# Patient Record
Sex: Male | Born: 1950 | Race: White | Hispanic: No | Smoking: Former smoker
Health system: Southern US, Community
[De-identification: ages and names within clinical notes are randomized; demographics above are authoritative.]

## PROBLEM LIST (undated history)

## (undated) DIAGNOSIS — R519 Headache, unspecified: Secondary | ICD-10-CM

## (undated) DIAGNOSIS — I10 Essential (primary) hypertension: Secondary | ICD-10-CM

## (undated) DIAGNOSIS — K259 Gastric ulcer, unspecified as acute or chronic, without hemorrhage or perforation: Secondary | ICD-10-CM

## (undated) DIAGNOSIS — G4730 Sleep apnea, unspecified: Secondary | ICD-10-CM

## (undated) DIAGNOSIS — K3189 Other diseases of stomach and duodenum: Secondary | ICD-10-CM

## (undated) DIAGNOSIS — E119 Type 2 diabetes mellitus without complications: Secondary | ICD-10-CM

## (undated) DIAGNOSIS — I864 Gastric varices: Secondary | ICD-10-CM

## (undated) DIAGNOSIS — Z87442 Personal history of urinary calculi: Secondary | ICD-10-CM

## (undated) DIAGNOSIS — Z860101 Personal history of adenomatous and serrated colon polyps: Secondary | ICD-10-CM

## (undated) DIAGNOSIS — K635 Polyp of colon: Secondary | ICD-10-CM

## (undated) DIAGNOSIS — I700 Atherosclerosis of aorta: Secondary | ICD-10-CM

## (undated) DIAGNOSIS — R112 Nausea with vomiting, unspecified: Secondary | ICD-10-CM

## (undated) DIAGNOSIS — R011 Cardiac murmur, unspecified: Secondary | ICD-10-CM

## (undated) DIAGNOSIS — I350 Nonrheumatic aortic (valve) stenosis: Secondary | ICD-10-CM

## (undated) DIAGNOSIS — D126 Benign neoplasm of colon, unspecified: Secondary | ICD-10-CM

## (undated) DIAGNOSIS — R51 Headache: Secondary | ICD-10-CM

## (undated) DIAGNOSIS — K7460 Unspecified cirrhosis of liver: Secondary | ICD-10-CM

## (undated) DIAGNOSIS — Z86010 Personal history of colonic polyps: Secondary | ICD-10-CM

## (undated) DIAGNOSIS — N400 Enlarged prostate without lower urinary tract symptoms: Secondary | ICD-10-CM

## (undated) DIAGNOSIS — K2970 Gastritis, unspecified, without bleeding: Secondary | ICD-10-CM

## (undated) DIAGNOSIS — J45909 Unspecified asthma, uncomplicated: Secondary | ICD-10-CM

## (undated) DIAGNOSIS — N281 Cyst of kidney, acquired: Secondary | ICD-10-CM

## (undated) DIAGNOSIS — N509 Disorder of male genital organs, unspecified: Secondary | ICD-10-CM

## (undated) DIAGNOSIS — K5790 Diverticulosis of intestine, part unspecified, without perforation or abscess without bleeding: Secondary | ICD-10-CM

## (undated) DIAGNOSIS — M1990 Unspecified osteoarthritis, unspecified site: Secondary | ICD-10-CM

## (undated) DIAGNOSIS — R569 Unspecified convulsions: Secondary | ICD-10-CM

## (undated) DIAGNOSIS — Z9889 Other specified postprocedural states: Secondary | ICD-10-CM

## (undated) DIAGNOSIS — K219 Gastro-esophageal reflux disease without esophagitis: Secondary | ICD-10-CM

## (undated) HISTORY — DX: Personal history of colonic polyps: Z86.010

## (undated) HISTORY — PX: TONSILLECTOMY: SUR1361

## (undated) HISTORY — DX: Gastritis, unspecified, without bleeding: K29.70

## (undated) HISTORY — DX: Diverticulosis of intestine, part unspecified, without perforation or abscess without bleeding: K57.90

## (undated) HISTORY — DX: Cyst of kidney, acquired: N28.1

## (undated) HISTORY — DX: Benign prostatic hyperplasia without lower urinary tract symptoms: N40.0

## (undated) HISTORY — DX: Gastric ulcer, unspecified as acute or chronic, without hemorrhage or perforation: K25.9

## (undated) HISTORY — DX: Essential (primary) hypertension: I10

## (undated) HISTORY — PX: BACK SURGERY: SHX140

## (undated) HISTORY — PX: HERNIA REPAIR: SHX51

## (undated) HISTORY — DX: Disorder of male genital organs, unspecified: N50.9

## (undated) HISTORY — DX: Other diseases of stomach and duodenum: K31.89

## (undated) HISTORY — DX: Personal history of adenomatous and serrated colon polyps: Z86.0101

## (undated) HISTORY — DX: Gastric varices: I86.4

## (undated) HISTORY — DX: Atherosclerosis of aorta: I70.0

## (undated) HISTORY — DX: Gastro-esophageal reflux disease without esophagitis: K21.9

## (undated) HISTORY — DX: Polyp of colon: K63.5

## (undated) HISTORY — DX: Unspecified cirrhosis of liver: K74.60

## (undated) HISTORY — DX: Benign neoplasm of colon, unspecified: D12.6

---

## 1995-03-30 HISTORY — PX: LUMBAR DISC SURGERY: SHX700

## 1998-02-18 ENCOUNTER — Inpatient Hospital Stay (HOSPITAL_COMMUNITY): Admission: EM | Admit: 1998-02-18 | Discharge: 1998-02-19 | Payer: Self-pay | Admitting: Emergency Medicine

## 1998-03-13 ENCOUNTER — Ambulatory Visit (HOSPITAL_BASED_OUTPATIENT_CLINIC_OR_DEPARTMENT_OTHER): Admission: RE | Admit: 1998-03-13 | Discharge: 1998-03-13 | Payer: Self-pay | Admitting: Specialist

## 1998-04-27 ENCOUNTER — Encounter: Admission: RE | Admit: 1998-04-27 | Discharge: 1998-07-26 | Payer: Self-pay | Admitting: *Deleted

## 1998-08-16 ENCOUNTER — Emergency Department (HOSPITAL_COMMUNITY): Admission: EM | Admit: 1998-08-16 | Discharge: 1998-08-16 | Payer: Self-pay | Admitting: Emergency Medicine

## 1999-01-22 ENCOUNTER — Ambulatory Visit (HOSPITAL_COMMUNITY): Admission: RE | Admit: 1999-01-22 | Discharge: 1999-01-22 | Payer: Self-pay | Admitting: Family Medicine

## 1999-01-22 ENCOUNTER — Encounter: Payer: Self-pay | Admitting: Family Medicine

## 2000-07-31 ENCOUNTER — Encounter: Payer: Self-pay | Admitting: Family Medicine

## 2000-07-31 ENCOUNTER — Ambulatory Visit (HOSPITAL_COMMUNITY): Admission: RE | Admit: 2000-07-31 | Discharge: 2000-07-31 | Payer: Self-pay | Admitting: Family Medicine

## 2000-10-30 ENCOUNTER — Encounter: Payer: Self-pay | Admitting: Family Medicine

## 2000-10-30 ENCOUNTER — Ambulatory Visit (HOSPITAL_COMMUNITY): Admission: RE | Admit: 2000-10-30 | Discharge: 2000-10-30 | Payer: Self-pay | Admitting: Family Medicine

## 2000-12-07 ENCOUNTER — Encounter: Payer: Self-pay | Admitting: Emergency Medicine

## 2000-12-08 ENCOUNTER — Encounter: Payer: Self-pay | Admitting: Emergency Medicine

## 2000-12-08 ENCOUNTER — Inpatient Hospital Stay (HOSPITAL_COMMUNITY): Admission: EM | Admit: 2000-12-08 | Discharge: 2000-12-11 | Payer: Self-pay | Admitting: Emergency Medicine

## 2000-12-19 ENCOUNTER — Encounter: Admission: RE | Admit: 2000-12-19 | Discharge: 2000-12-19 | Payer: Self-pay | Admitting: Family Medicine

## 2001-08-29 HISTORY — PX: INGUINAL HERNIA REPAIR: SHX194

## 2001-11-03 ENCOUNTER — Emergency Department (HOSPITAL_COMMUNITY): Admission: EM | Admit: 2001-11-03 | Discharge: 2001-11-03 | Payer: Self-pay | Admitting: *Deleted

## 2002-06-03 ENCOUNTER — Encounter: Payer: Self-pay | Admitting: Otolaryngology

## 2002-06-03 ENCOUNTER — Encounter: Admission: RE | Admit: 2002-06-03 | Discharge: 2002-06-03 | Payer: Self-pay | Admitting: Otolaryngology

## 2002-09-16 ENCOUNTER — Encounter: Admission: RE | Admit: 2002-09-16 | Discharge: 2002-09-16 | Payer: Self-pay | Admitting: Otolaryngology

## 2002-09-16 ENCOUNTER — Encounter: Payer: Self-pay | Admitting: Otolaryngology

## 2002-11-30 ENCOUNTER — Emergency Department (HOSPITAL_COMMUNITY): Admission: EM | Admit: 2002-11-30 | Discharge: 2002-11-30 | Payer: Self-pay | Admitting: Emergency Medicine

## 2002-11-30 ENCOUNTER — Encounter: Payer: Self-pay | Admitting: Emergency Medicine

## 2003-06-05 ENCOUNTER — Emergency Department (HOSPITAL_COMMUNITY): Admission: EM | Admit: 2003-06-05 | Discharge: 2003-06-05 | Payer: Self-pay | Admitting: Emergency Medicine

## 2003-07-07 ENCOUNTER — Ambulatory Visit (HOSPITAL_COMMUNITY): Admission: RE | Admit: 2003-07-07 | Discharge: 2003-07-07 | Payer: Self-pay | Admitting: Gastroenterology

## 2005-08-19 ENCOUNTER — Ambulatory Visit (HOSPITAL_COMMUNITY): Admission: RE | Admit: 2005-08-19 | Discharge: 2005-08-19 | Payer: Self-pay | Admitting: Gastroenterology

## 2005-09-16 ENCOUNTER — Ambulatory Visit (HOSPITAL_COMMUNITY): Admission: RE | Admit: 2005-09-16 | Discharge: 2005-09-16 | Payer: Self-pay | Admitting: Gastroenterology

## 2005-11-28 HISTORY — PX: SHOULDER SURGERY: SHX246

## 2006-07-14 HISTORY — PX: SHOULDER SURGERY: SHX246

## 2009-07-17 ENCOUNTER — Encounter: Payer: Self-pay | Admitting: Internal Medicine

## 2010-06-22 ENCOUNTER — Ambulatory Visit: Payer: Self-pay | Admitting: Internal Medicine

## 2010-06-22 DIAGNOSIS — J45909 Unspecified asthma, uncomplicated: Secondary | ICD-10-CM | POA: Insufficient documentation

## 2010-06-22 DIAGNOSIS — R05 Cough: Secondary | ICD-10-CM

## 2010-06-22 DIAGNOSIS — J309 Allergic rhinitis, unspecified: Secondary | ICD-10-CM | POA: Insufficient documentation

## 2010-06-22 DIAGNOSIS — E785 Hyperlipidemia, unspecified: Secondary | ICD-10-CM | POA: Insufficient documentation

## 2010-06-22 DIAGNOSIS — R059 Cough, unspecified: Secondary | ICD-10-CM | POA: Insufficient documentation

## 2010-09-28 NOTE — Procedures (Signed)
°

## 2010-09-28 NOTE — Assessment & Plan Note (Signed)
Summary: Pulmonary consultation/ cough ? uacs vs asthma   Copy to:  Dr. Morrie Sheldon Primary Provider/Referring Provider:  Dr. Morrie Sheldon  CC:  Consult Cough..  History of Present Illness: 60 yowm quit smoking 2005 with cough that seemed better p quit and relatively well preserved ex tolerance including long walks [place] sob but never oob  June 22, 2010  1st pulmonary office eval for cough, rarely productive,  present daily > nightly no noct exac or am flares. symbicort started for flare 1 year maybe helped a little.  No itching sneezing running nose. H/o bad gerd but says the acid heartburn has been eliminated with nexium.   Pt denies any significant sore throat, dysphagia, itching, sneezing,  nasal congestion or excess secretions,  fever, chills, sweats, unintended wt loss, pleuritic or exertional cp, hempoptysis, change in activity tolerance  orthopnea pnd or leg swelling Pt also denies any obvious fluctuation in symptoms with weather or environmental change or other alleviating or aggravating factors.       Current Medications (verified): 1)  Nexium 40 Mg Cpdr (Esomeprazole Magnesium) .... Take 1 Tablet By Mouth Once A Day 2)  Lipitor 40 Mg Tabs (Atorvastatin Calcium) .... Take 1 Tablet By Mouth Once A Day 3)  Singulair 10 Mg Tabs (Montelukast Sodium) .... Take 1 Tablet By Mouth Once A Day 4)  Fexofenadine Hcl 180 Mg Tabs (Fexofenadine Hcl) .... Take 1 Tablet By Mouth Once A Day 5)  Micardis 40 Mg Tabs (Telmisartan) .... Take 1 Tablet By Mouth Once A Day 6)  Humalog 100 Unit/ml Soln (Insulin Lispro (Human)) .... Sliding Scale 7)  Lantus 100 Unit/ml Soln (Insulin Glargine) .... 50 Units Twice Daily 8)  Androgel Pump 1.25 Gm/act (1%) Gel (Testosterone) .... 6 Pumps Daily 9)  Symbicort 80-4.5 Mcg/act Aero (Budesonide-Formoterol Fumarate) .... Unsure of Strength, 2 Puufs Twice Daily 10)  Niaspan 500 Mg Cr-Tabs (Niacin (Antihyperlipidemic)) .... Take 1 Tablet By Mouth Once A Day 11)   Aspir-Low 81 Mg Tbec (Aspirin) .... Take 1 Tablet By Mouth Once A Day 12)  Ranitidine Hcl 300 Mg Tabs (Ranitidine Hcl) .... Take 1 Tab By Mouth At Bedtime 13)  Vitamin D3 2000 Unit Caps (Cholecalciferol) .... Take 1 Tablet By Mouth Once A Day 14)  Multivitamins  Tabs (Multiple Vitamin) .... Take 1 Tablet By Mouth Once A Day 15)  B Complex  Tabs (B Complex Vitamins) .... Take 1 Tablet By Mouth Once A Day 16)  Caltrate 600+d Plus 600-400 Mg-Unit Tabs (Calcium Carbonate-Vit D-Min) .... Take 1 Tablet By Mouth Once A Day 17)  Fish Oil 1000 Mg Caps (Omega-3 Fatty Acids) .... Take 1 Tablet By Mouth Once A Day 18)  Glucosamine-Chondroitin 1500-1200 Mg/44m Liqd (Glucosamine-Chondroitin) .... Take 1 Tablet By Mouth Once A Day 19)  Ocuvite  Tabs (Multiple Vitamins-Minerals) .... Take 1 Tablet By Mouth Once A Day  Allergies: 1)  ! Erythromycin 2)  ! Sulfa  Past History:  Past Medical History: Allergic Rhinitis Asthma      [facility]  Chronic cough..........................................................Marland Kitchenert Diabetes Hyperlipidemia  Past Surgical History: back surgery-1994 Nose reconstruction Shoulder replacement-2008 Hernia-2000  Family History: Father-asthma MGM-heart disease, breast Mother-heart disease  Social History: Married retired Quit using pipe in 2005, used x 281yr Pt states he used cig. x 5 years prior to pipe use.   Review of Systems       The patient complains of non-productive cough.  The patient denies shortness of breath with activity, shortness of  breath at rest, productive cough, coughing up blood, chest pain, irregular heartbeats, acid heartburn, indigestion, loss of appetite, weight change, abdominal pain, difficulty swallowing, sore throat, tooth/dental problems, headaches, nasal congestion/difficulty breathing through nose, sneezing, itching, ear ache, anxiety, depression, hand/feet swelling, joint stiffness or pain, rash, change in color of  mucus, and fever.    Vital Signs:  Patient profile:   60 year old male Height:      70 inches Weight:      261 pounds BMI:     37.58 O2 Sat:      96 % on Room air Temp:     97.5 degrees F oral Pulse rate:   70 / minute BP sitting:   130 / 72  (right arm) Cuff size:   regular  Vitals Entered By: Sanders Bing CMA (June 22, 2010 8:57 AM)  O2 Flow:  Room air  Physical Exam  Additional Exam:  pleasant amb wm in nad  wt 245 June 22, 2010 HEENT: nl dentition, turbinates, and orophanx. Nl external ear canals without cough reflex NECK :  without JVD/Nodes/TM/ nl carotid upstrokes bilaterally LUNGS: no acc muscle use, clear to A and P bilaterally without cough on insp or exp maneuvers CV:  RRR  no s3 or murmur or increase in P2, no edema  ABD:  soft and nontender with nl excursion in the supine position. No bruits or organomegaly, bowel sounds nl MS:  warm without deformities, calf tenderness, cyanosis or clubbing SKIN: warm and dry without lesions   NEURO:  alert, approp, no deficits     Impression & Recommendations:  Problem # 1:  COUGH (ICD-786.2)  The most common causes of chronic cough in immunocompetent adults include: upper airway cough syndrome (UACS), previously referred to as postnasal drip syndrome,  caused by variety of rhinosinus conditions; (2) asthma; (3) GERD; (4) chronic bronchitis from cigarette smoking or other inhaled environmental irritants; (5) nonasthmatic eosinophilic bronchitis; and (6) bronchiectasis. These conditions, singly or in combination, have accounted for up to 94% of the causes of chronic cough in prospective studies.    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, so named because it's frequently impossible to sort out how much is  CR/sinusitis with freq throat clearing (which can be related to primary GERD)   vs  causing  secondary extra esophageal GERD from wide swings in gastric pressure that occur with throat  clearing, promoting self use of mint and menthol lozenges that reduce the lower esophageal sphincter tone and exacerbate the problem further These are the same pts who not infrequently have failed to tolerate ace inhibitors,  dry powder inhalers or biphosphonates or report having reflux symptoms that don't respond to standard doses of PPI  For now try optimize hfa technique with symbiocort since it seems to have helped (suggesting asthma) some but may aggravate uacs if hfa technique poor and max rx for gerd with diet and continued acid suppression  The standardized cough guidelines recently published in Chest are a 14 step process, not a single office visit,  and are intended  to address this problem logically,  with an alogrithm dependent on response to each progressive step  to determine a specific diagnosis with  minimal addtional testing needed. Therefore if compliance is an issue this empiric standardized approach simply won't work.  See instructions for specific recommendations   Orders: Consultation Level V (78676)  Problem # 2:  ALLERGIC RHINITIS (ICD-477.9)  His updated medication list for  this problem includes:    Fexofenadine Hcl 180 Mg Tabs (Fexofenadine hcl) ..... Use one daily if needed for itching sneezy runny nose  Appears well controlled so try off allegra and consider a trial of 1st gen antihistamines if persistent persistent cough.  Orders: Consultation Level V [id number]  Problem # 3:  ASTHMA (ICD-493.90) appears to be very well controlled with no evidence of sign copd   Medications Added to Medication List This Visit: 1)  Nexium 40 Mg Cpdr (Esomeprazole magnesium) .... Take 1 tablet by mouth once a day 2)  Nexium 40 Mg Cpdr (Esomeprazole magnesium) .... Take  one 30-60 min before first meal of the day 3)  Lipitor 40 Mg Tabs (Atorvastatin calcium) .... Take 1 tablet by mouth once a day 4)  Singulair 10 Mg Tabs (Montelukast sodium) .... Take 1 tablet by mouth once a day 5)   Micardis 40 Mg Tabs (Telmisartan) .... Take 1 tablet by mouth once a day 6)  Fexofenadine Hcl 180 Mg Tabs (Fexofenadine hcl) .... Take 1 tablet by mouth once a day 7)  Humalog 100 Unit/ml Soln (Insulin lispro (human)) .... Sliding scale 8)  Lantus 100 Unit/ml Soln (Insulin glargine) .... 50 units twice daily 9)  Androgel Pump 1.25 Gm/act (1%) Gel (Testosterone) .... 6 pumps daily 10)  Symbicort 80-4.5 Mcg/act Aero (Budesonide-formoterol fumarate) .... Unsure of strength, 2 puufs twice daily 11)  Symbicort 80-4.5 Mcg/act Aero (Budesonide-formoterol fumarate) .... 2 puffs first thing  in am and 2 puffs again in pm about 12 hours later 12)  Niaspan 500 Mg Cr-tabs (Niacin (antihyperlipidemic)) .... Take 1 tablet by mouth once a day 13)  Aspir-low 81 Mg Tbec (Aspirin) .... Take 1 tablet by mouth once a day 14)  Ranitidine Hcl 300 Mg Tabs (Ranitidine hcl) .... Take 1 tab by mouth at bedtime 15)  Vitamin D3 2000 Unit Caps (Cholecalciferol) .... Take 1 tablet by mouth once a day 16)  Multivitamins Tabs (Multiple vitamin) .... Take 1 tablet by mouth once a day 17)  B Complex Tabs (B complex vitamins) .... Take 1 tablet by mouth once a day 18)  Caltrate 600+d Plus 600-400 Mg-unit Tabs (Calcium carbonate-vit d-min) .... Take 1 tablet by mouth once a day 19)  Fish Oil 1000 Mg Caps (Omega-3 fatty acids) .... Take 1 tablet by mouth once a day 20)  Glucosamine-chondroitin 1500-1200 Mg/16m Liqd (Glucosamine-chondroitin) .... Take 1 tablet by mouth once a day 21)  Fexofenadine Hcl 180 Mg Tabs (Fexofenadine hcl) .... Use one daily if needed for itching sneezy runny nose 22)  Ocuvite Tabs (Multiple vitamins-minerals) .... Take 1 tablet by mouth once a day  Patient Instructions: 1)  Remember  symbicort 2 puffs first thing  in am and 2 puffs again in pm about 12 hours later  2)  Work on inhaler technique:  relax and blow all the way out then take a nice smooth deep breath back in, triggering the inhaler at same  time you start breathing in  3)  fexofendanine can be used if needed for itching sneezing and runny nose 4)  GERD (REFLUX)  is a common cause of respiratory symptoms. It commonly presents without heartburn and can be treated with medication, but also with lifestyle changes including avoidance of late meals, excessive alcohol, smoking cessation, and avoid fatty foods, chocolate, peppermint, colas, red wine, and acidic juices such as orange juice. NO MINT OR MENTHOL PRODUCTS SO NO COUGH DROPS  5)  USE SUGARLESS CANDY INSTEAD (jolley ranchers)  6)  NO OIL BASED VITAMINS (no oil based vitamins) 7)  Please schedule a follow-up appointment in 6 weeks, sooner if needed - will need a cxr by end of year   Immunization History:  Influenza Immunization History:    Influenza:  historical (06/15/2010)    Appended Document: Pulmonary consultation/ cough ? uacs vs asthma cxr reviewed from 11/ 19/10 ok, needs repeat in November this year since cough is ongoing

## 2010-11-02 ENCOUNTER — Ambulatory Visit: Payer: Self-pay | Admitting: Internal Medicine

## 2010-11-09 ENCOUNTER — Encounter: Payer: Self-pay | Admitting: Internal Medicine

## 2010-11-09 ENCOUNTER — Ambulatory Visit (INDEPENDENT_AMBULATORY_CARE_PROVIDER_SITE_OTHER): Payer: BC Managed Care – PPO | Admitting: Internal Medicine

## 2010-11-09 DIAGNOSIS — R059 Cough, unspecified: Secondary | ICD-10-CM

## 2010-11-09 DIAGNOSIS — R635 Abnormal weight gain: Secondary | ICD-10-CM | POA: Insufficient documentation

## 2010-11-09 DIAGNOSIS — R05 Cough: Secondary | ICD-10-CM

## 2010-11-16 NOTE — Assessment & Plan Note (Signed)
Summary: Pulmonary/ ext f/u with hfa 75%   Copy to:  Dr. Morrie Sheldon Primary Provider/Referring Provider:  Dr. Morrie Sheldon  CC:  Cough better.Marland Kitchen  History of Present Illness: 8  yowm quit smoking 2005 with cough that seemed better p quit and relatively well preserved ex tolerance including long walks [place] sob but never oob.  June 22, 2010  1st pulmonary office eval for cough, rarely productive,  present daily > nightly no noct exac or am flares. symbicort started for flare 1 year maybe helped a little.  No itching sneezing running nose. H/o bad gerd but says the acid heartburn has been eliminated with nexium.  imp was uacs rec Remember  symbicort 2 puffs first thing  in am and 2 puffs again in pm about 12 hours later  Work on inhaler technique:  relax and blow all the way out then take a nice smooth deep breath back in, triggering the inhaler at same time you start breathing in  fexofendanine can be used if needed for itching sneezing and runny nose GERD (REFLUX)  diet  November 09, 2010 ov cough better but variably present now while supine, admits he doesn't always remember symbicort but seems to help. Pt denies any significant sore throat, dysphagia, itching, sneezing,  nasal congestion or excess secretions,  fever, chills, sweats, unintended wt loss, pleuritic or exertional cp, hempoptysis, change in activity tolerance  orthopnea pnd or leg swelling.  Pt also denies any obvious fluctuation in symptoms with weather or environmental change or other alleviating or aggravating factors.           Current Medications (verified): 1)  Nexium 40 Mg Cpdr (Esomeprazole Magnesium) .... Take  One 30-60 Min Before First Meal of The Day 2)  Lipitor 40 Mg Tabs (Atorvastatin Calcium) .... Take 1 Tablet By Mouth Once A Day 3)  Singulair 10 Mg Tabs (Montelukast Sodium) .... Take 1 Tablet By Mouth Once A Day 4)  Micardis 40 Mg Tabs (Telmisartan) .... Take 1 Tablet By Mouth Once A Day 5)  Humalog 100  Unit/ml Soln (Insulin Lispro (Human)) .... Sliding Scale 6)  Lantus 100 Unit/ml Soln (Insulin Glargine) .... 50 Units Twice Daily 7)  Androgel Pump 1.25 Gm/act (1%) Gel (Testosterone) .... 6 Pumps Daily 8)  Symbicort 80-4.5 Mcg/act Aero (Budesonide-Formoterol Fumarate) .... 2 Puffs First Thing  in Am and 2 Puffs Again in Pm About 12 Hours Later 9)  Niaspan 500 Mg Cr-Tabs (Niacin (Antihyperlipidemic)) .... Take 1 Tablet By Mouth Once A Day 10)  Aspir-Low 81 Mg Tbec (Aspirin) .... Take 1 Tablet By Mouth Once A Day 11)  Ranitidine Hcl 300 Mg Tabs (Ranitidine Hcl) .... Take 1 Tab By Mouth At Bedtime 12)  Vitamin D3 2000 Unit Caps (Cholecalciferol) .... Take 1 Tablet By Mouth Once A Day 13)  Multivitamins  Tabs (Multiple Vitamin) .... Take 1 Tablet By Mouth Once A Day 14)  B Complex  Tabs (B Complex Vitamins) .... Take 1 Tablet By Mouth Once A Day 15)  Caltrate 600+d Plus 600-400 Mg-Unit Tabs (Calcium Carbonate-Vit D-Min) .... Take 1 Tablet By Mouth Once A Day 16)  Glucosamine-Chondroitin 1500-1200 Mg/80m Liqd (Glucosamine-Chondroitin) .... Take 1 Tablet By Mouth Once A Day 17)  Fexofenadine Hcl 180 Mg Tabs (Fexofenadine Hcl) .... Use One Daily If Needed For Itching Sneezy Runny Nose 18)  Ocuvite  Tabs (Multiple Vitamins-Minerals) .... Take 1 Tablet By Mouth Once A Day 19)  Omega-3 Fish Oil/vitamin D3 1000-1000 Mg-Unit Caps (Fish Oil-Cholecalciferol) .... Take  1 Tablet By Mouth Once A Day  Allergies (verified): 1)  ! Erythromycin 2)  ! Sulfa  Past History:  Past Medical History: Allergic Rhinitis Asthma      - HFA  75% p coaching November 09, 2010  Chronic cough.........................................................Marland KitchenWert Morbid Obesity     - Calorie balance discussed November 09, 2010 > rec nutrition eval next  Diabetes Hyperlipidemia  Vital Signs:  Patient profile:   60 year old male Height:      70 inches Weight:      261 pounds O2 Sat:      94 % on Room air Temp:     98.1 degrees F  oral Pulse rate:   77 / minute BP sitting:   112 / 72  (right arm) Cuff size:   regular  Vitals Entered By:  Bing CMA (November 09, 2010 2:04 PM)  O2 Flow:  Room air  Physical Exam  Additional Exam:  wt 261 >  261 November 09, 2010 pleasant amb wm in nad  HEENT: nl dentition, turbinates, and orophanx. Nl external ear canals without cough reflex NECK :  without JVD/Nodes/TM/ nl carotid upstrokes bilaterally LUNGS: no acc muscle use, clear to A and P bilaterally without cough on insp or exp maneuvers CV:  RRR  no s3 or murmur or increase in P2, no edema  ABD:  soft and nontender with nl excursion in the supine position. No bruits or organomegaly, bowel sounds nl MS:  warm without deformities, calf tenderness, cyanosis or clubbing    CXR  Procedure date:  08/06/2010  Findings:      wnl  Impression & Recommendations:  Problem # 1:  COUGH (ICD-786.2)  The most common causes of chronic cough in immunocompetent adults include: upper airway cough syndrome (UACS), previously referred to as postnasal drip syndrome,  caused by variety of rhinosinus conditions; (2) asthma; (3) GERD; (4) chronic bronchitis from cigarette smoking or other inhaled environmental irritants; (5) nonasthmatic eosinophilic bronchitis; and (6) bronchiectasis. These conditions, singly or in combination, have accounted for up to 94% of the causes of chronic cough in prospective studies.    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, so named because it's frequently impossible to sort out how much is  CR/sinusitis with freq throat clearing (which can be related to primary GERD)   vs  causing  secondary extra esophageal GERD from wide swings in gastric pressure that occur with throat clearing, promoting self use of mint and menthol lozenges that reduce the lower esophageal sphincter tone and exacerbate the problem further These are the same pts who not infrequently have failed to tolerate  ace inhibitors,  dry powder inhalers or biphosphonates or report having reflux symptoms that don't respond to standard doses of PPI  For now try optimize hfa technique with symbiocort since it seems to have helped (suggesting asthma) some but may aggravate uacs if hfa technique poor and max rx for gerd with diet and continued acid suppression  The standardized cough guidelines recently published in Chest are a 14 step process, not a single office visit,  and are intended  to address this problem logically,  with an alogrithm dependent on response to each progressive step  to determine a specific diagnosis with  minimal addtional testing needed. Therefore if compliance is an issue this empiric standardized approach simply won't work.  See instructions for specific recommendations   next step is sinus ct and allergy eval  Orders: Est. Patient  Level IV (37169)  Problem # 2:  WEIGHT GAIN, ABNORMAL (ICD-783.1)  Weight control is a matter of calorie balance which needs to be tilted in the pt's favor by eating less and exercising more.  Specifically, I recommended  exercise at a level where pt  is short of breath but not out of breath 30 minutes daily.  If not losing weight on this program, I would strongly recommend pt see a nutritionist with a food diary recorded for two weeks prior to the visit.     Orders: Est. Patient Level IV (67893)  Problem # 3:  ASTHMA (ICD-493.90) I spent extra time with the patient today explaining optimal mdi  technique.  This improved from  25-75% p coaching.  Not clear this really is asthma but may have cough variant.  Would like to give him a fair trial but if not convinced helping will consider stopping next  Medications Added to Medication List This Visit: 1)  Omega-3 Fish Oil/vitamin D3 1000-1000 Mg-unit Caps (Fish oil-cholecalciferol) .... Take 1 tablet by mouth once a day  Patient Instructions: 1)  Work on inhaler technique:  relax and blow all the way out  then take a nice smooth deep breath back in, triggering the inhaler at same time you start breathing in and hold for a few seconds  2)  stop fish oil completely for at least a few weeks. 3)  Weight control is simply a matter of calorie balance which needs to be tilted in your favor by eating less and exercising more.  To get the most out of exercise, you need to be continuously aware that you are short of breath, but never out of breath, for 30 minutes daily. As you improve, it will actually be easier for you to do the same amount in  30 minutes so always push to the level where you are short of breath.  If this does not result in gradual weight reduction,  I recommend  a nutritionist for a food diary.  You are aiming for a pound a week. 4)  The next step in the work up if not satisfied would be a ct sinus which can  [id number] and ask a for [facility] 5)  Please schedule a follow-up appointment in 6 mont, sooner if needed

## 2010-12-28 ENCOUNTER — Encounter: Payer: Self-pay | Admitting: Family Medicine

## 2011-01-14 NOTE — Op Note (Signed)
   NAME:  ABRAHM, Tony Cantu                          ACCOUNT NO.:  [id number]   MEDICAL RECORD NO.:  [id number]                   PATIENT TYPE:  AMB   LOCATION:  ENDO                                 FACILITY[facility]  [facility]   PHYSICIAN:  John C. Amedeo Plenty, M.D.                 DATE OF BIRTH:  03-29-1951   DATE OF PROCEDURE:  07/07/2003  DATE OF DISCHARGE:                                 OPERATIVE REPORT   PROCEDURE:  Colonoscopy.   INDICATION FOR PROCEDURE:  Colon cancer screening in a 60 year old patient  with no prior screening.   DESCRIPTION OF PROCEDURE:  The patient was placed in the left lateral  decubitus position and placed on the pulse monitor with continuous low-flow  oxygen delivered by nasal cannula.  He was sedated with 50 mcg of IV  fentanyl and 6 mg of IV Versed.  The Olympus video colonoscope was inserted  into the rectum and advanced to the cecum, confirmed by transillumination at  McBurney's point and visualization of the ileocecal valve and the  appendiceal orifice.  The prep was excellent.  The cecum, ascending,  transverse, descending, and sigmoid colon all appeared normal with no  masses, polyps, diverticula, or other mucosal abnormalities.  The rectum  likewise appeared normal.  On retroflex view, the anus revealed no obvious  internal hemorrhoids.  The scope was then withdrawn and the patient returned  to the recovery room in stable condition.  He tolerated the procedure well  and there were no immediate complications.   IMPRESSION:  Normal screening colonoscopy.   PLAN:  Next colon screening by sigmoidoscopy in five years.                                               John C. Amedeo Plenty, M.D.    [facility]  D:  07/07/2003  T:  07/07/2003  Job:  [id number]   cc:   Chipper Herb, M.D.  [address] [place]  [place] [address]  Fax: [phone]

## 2011-03-10 ENCOUNTER — Encounter: Payer: Self-pay | Admitting: Cardiology

## 2011-03-18 ENCOUNTER — Ambulatory Visit (INDEPENDENT_AMBULATORY_CARE_PROVIDER_SITE_OTHER): Payer: BC Managed Care – PPO | Admitting: Cardiology

## 2011-03-18 ENCOUNTER — Encounter: Payer: BC Managed Care – PPO | Admitting: Cardiology

## 2011-03-18 ENCOUNTER — Encounter: Payer: Self-pay | Admitting: Cardiology

## 2011-03-18 VITALS — BP 118/68 | HR 62 | Ht 70.0 in | Wt 264.0 lb

## 2011-03-18 DIAGNOSIS — E669 Obesity, unspecified: Secondary | ICD-10-CM

## 2011-03-18 DIAGNOSIS — Z789 Other specified health status: Secondary | ICD-10-CM

## 2011-03-18 DIAGNOSIS — Z9189 Other specified personal risk factors, not elsewhere classified: Secondary | ICD-10-CM

## 2011-03-18 DIAGNOSIS — I2510 Atherosclerotic heart disease of native coronary artery without angina pectoris: Secondary | ICD-10-CM

## 2011-03-20 DIAGNOSIS — E669 Obesity, unspecified: Secondary | ICD-10-CM | POA: Insufficient documentation

## 2011-03-20 DIAGNOSIS — Z9189 Other specified personal risk factors, not elsewhere classified: Secondary | ICD-10-CM | POA: Insufficient documentation

## 2011-03-20 NOTE — Progress Notes (Signed)
PMH: Dr. Laurance Flatten  60 yo with history of HTN, DM, obesity presents for cardiology followup.  He is a former patient of Dr. Doreatha Lew who I am seeing for the first time today.  He had a cardiolite in 2009 that was a normal study.  He has been doing well in general.  He golfs 2 times a week at [place].  No chest pain or dyspnea with exertion.  He needs to lose weight but has had a hard time doing this.  His diabetes has been under good control, last hgbA1c was 5.8.  BP is under good control.    ECG: NSR, RBBB (no change)  Labs (7/12): hgbA1c 5.8  PMH: 1. Obesity 2. HTN 3. Diabetes mellitus with history of hypoglycemic episodes.  4. Hyperlipidemia 5. Cardiolite (6/09): Normal study.  6. Lumbar spine surgery in 1994 7. Hernia repair in 2003 8. Plantar fasciitis 9. Shoulder replacement  SH: Divorced in 2004 and remarried now.  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 Likes golf.   FH: Grandmother with MI in her 15s, mother with MI in her 57s, father died in his 97s (suicide).   ROS: All systems reivewed and negative except as per HPI.   Current Outpatient Prescriptions  Medication Sig Dispense Refill  . albuterol (VENTOLIN HFA) 108 (90 BASE) MCG/ACT inhaler Inhale 2 puffs into the lungs as needed.        Marland Kitchen aspirin (ASPIRIN LOW DOSE) 81 MG EC tablet Take 81 mg by mouth daily.        Marland Kitchen atorvastatin (LIPITOR) 40 MG tablet Take 40 mg by mouth daily.        Marland Kitchen b complex vitamins tablet Take 1 tablet by mouth daily.        . budesonide-formoterol (SYMBICORT) 160-4.5 MCG/ACT inhaler Inhale 2 puffs into the lungs as needed.        . Calcium Carbonate (CALTRATE 600 PO) Take by mouth daily.        . Cholecalciferol (VITAMIN D3) 2000 UNITS capsule Take 2,000 Units by mouth daily.        Marland Kitchen esomeprazole (NEXIUM) 40 MG capsule Take 40 mg by mouth daily before breakfast.        . fexofenadine (ALLEGRA) 180 MG tablet Take 180 mg by mouth daily.        . fish oil-omega-3 fatty  acids 1000 MG capsule Take 1 g by mouth daily.        . Glucosamine-Chondroit-Vit C-Mn (GLUCOSAMINE 1500 COMPLEX) CAPS Take by mouth daily.        . Insulin Glargine (LANTUS [place]) Inject into the skin. 50 units am, 75 units pm      . insulin lispro (HUMALOG) 100 UNIT/ML injection Inject into the skin. Sliding scale       . montelukast (SINGULAIR) 10 MG tablet Take 10 mg by mouth at bedtime.        . Multiple Vitamins-Minerals (MATURE BALANCE PO) Take by mouth daily.        . Multiple Vitamins-Minerals (OCUVITE PO) Take by mouth daily.        . niacin 500 MG tablet Take 500 mg by mouth daily with breakfast.        . ONE TOUCH ULTRA TEST test strip       . ranitidine (ZANTAC) 300 MG tablet Take 300 mg by mouth at bedtime.        Marland Kitchen telmisartan (MICARDIS) 40 MG tablet Take 40 mg by  mouth daily.        . Testosterone (ANDROGEL PUMP TD) Place onto the skin.        . BD INSULIN SYRINGE ULTRAFINE 31G X 5/16" 0.5 ML MISC       . Lancets (BD LANCET ULTRAFINE 30G) MISC         BP 118/68  Pulse 62  Ht 5' 10"  (1.778 m)  Wt 264 lb (119.75 kg)  BMI 37.88 kg/m2 General: NAD, obese.  Neck: No JVD, no thyromegaly or thyroid nodule.  Lungs: Clear to auscultation bilaterally with normal respiratory effort. CV: Nondisplaced PMI.  Heart regular S1/S2, no S3/S4, no murmur.  1+ ankle edema with some lower leg varicosities.  No carotid bruit.  Normal pedal pulses.  Abdomen: Soft, nontender, no hepatosplenomegaly, no distention.  Neurologic: Alert and oriented x 3.  Psych: Normal affect. Extremities: No clubbing or cyanosis.

## 2011-03-20 NOTE — Assessment & Plan Note (Signed)
Patient has no ischemic symptoms.  ECG is stable.  He does have a number of risk factors for coronary disease, including HTN, hyperlipidemia, diabetes, and obesity.  RIsk factors seem to be under good control except he needs to diet and exercise to lose weight.  Last stress test was in 6/09: cardiolite showed no ischemia or infarction.  He will continue current meds.  I will obtain labs from [facility] (were done recently).  Goal LDL at least < 100 given diabetes.

## 2011-03-20 NOTE — Assessment & Plan Note (Signed)
We talked at length about his need to diet and exercise to lose weight.

## 2011-07-05 ENCOUNTER — Encounter: Payer: Self-pay | Admitting: Internal Medicine

## 2011-07-05 ENCOUNTER — Ambulatory Visit (INDEPENDENT_AMBULATORY_CARE_PROVIDER_SITE_OTHER): Payer: BC Managed Care – PPO | Admitting: Internal Medicine

## 2011-07-05 VITALS — BP 112/72 | HR 90 | Temp 98.5°F | Ht 70.0 in | Wt 268.0 lb

## 2011-07-05 DIAGNOSIS — J45909 Unspecified asthma, uncomplicated: Secondary | ICD-10-CM

## 2011-07-05 MED ORDER — ALBUTEROL SULFATE HFA 108 (90 BASE) MCG/ACT IN AERS: 2.0000 | INHALATION_SPRAY | Freq: Four times a day (QID) | RESPIRATORY_TRACT | Status: DC | PRN

## 2011-07-05 NOTE — Patient Instructions (Signed)
Work on inhaler technique:  relax and gently blow all the way out then take a nice smooth deep breath back in, triggering the inhaler at same time you start breathing in.  Hold for up to 5 seconds if you can.  Rinse and gargle with water when done   If your mouth or throat starts to bother you,   I suggest you time the inhaler to your dental care and after using the inhaler(s) brush teeth and tongue with a baking soda containing toothpaste and when you rinse this out, gargle with it first to see if this helps your mouth and throat.    Symbicort 160 Take 2 puffs first thing in am and then another 2 puffs about 12 hours later.    For breakthrough symptoms that you think might be asthma (cough, congestion, tightness) try proventil hfa 2 puffs every 4 hours as needed and you should expect relief within 5 minutes  Stop singulair for at least a week or two to see if it makes any difference in your symptoms, if worse restart  GERD (REFLUX)  is an extremely common cause of respiratory symptoms, many times with no significant heartburn at all.    It can be treated with medication, but also with lifestyle changes including avoidance of late meals, excessive alcohol, smoking cessation, and avoid fatty foods, chocolate, peppermint, colas, red wine, and acidic juices such as orange juice.  NO MINT OR MENTHOL PRODUCTS SO NO COUGH DROPS  USE SUGARLESS CANDY INSTEAD (jolley ranchers or Stover's)  NO OIL BASED VITAMINS - use powdered substitutes.  No fish oil any oil based vitamin for at least a month  Please schedule a follow up visit in 3 months but call sooner if needed

## 2011-07-05 NOTE — Progress Notes (Signed)
Subjective:     Patient ID: Tony Cantu, male   DOB: 05-Aug-1951, 60 y.o.   MRN: [id number]  HPI  39 yowm quit smoking 2005 with cough that seemed better p quit and relatively well preserved ex tolerance including long walks [place] sob but never oob.   June 22, 2010 1st pulmonary office eval for cough, rarely productive, present daily > nightly no noct exac or am flares. symbicort started for flare 1 year maybe helped a little. No itching sneezing running nose. H/o bad gerd but says the acid heartburn has been eliminated with nexium.  imp was uacs rec Remember symbicort 2 puffs first thing in am and 2 puffs again in pm about 12 hours later  Work on inhaler technique: fexofendanine can be used if needed for itching sneezing and runny nose  GERD  diet     November 09, 2010 ov cough better but variably present now while supine, admits he doesn't always remember symbicort but seems to help.  rec work on hfa/ follow diet   07/05/2011 f/u ov/Arhianna Ebey cc sob/cough better when remembers to take symbicort, still variable dry cough/ not compliant with diet, allegra helps sinus drainage but no effect on the cough.  Sleeping ok without nocturnal  or early am exacerbation  of respiratory  c/o's or need for noct saba. Also denies any obvious fluctuation of symptoms with weather or environmental changes or other aggravating or alleviating factors except as outlined above.  ROS  At present neg for  any significant sore throat, dysphagia, itching, sneezing,  nasal congestion or excess/ purulent secretions,  fever, chills, sweats, unintended wt loss, pleuritic or exertional cp, hempoptysis, orthopnea pnd or leg swelling.  Also denies presyncope, palpitations, heartburn, abdominal pain, nausea, vomiting, diarrhea  or change in bowel or urinary habits, dysuria,hematuria,  rash, arthralgias, visual complaints, headache, numbness weakness or ataxia.   .   Allergies  1) ! Erythromycin  2) ! Sulfa    Past  Medical History:  Allergic Rhinitis  > try off singulair 07/05/2011  Asthma  - HFA 50% p coaching 07/05/2011   Chronic cough.........................................................Marland KitchenWert  Morbid Obesity  - Calorie balance discussed November 09, 2010 > rec nutrition eval next  Diabetes  Hyperlipidemia     Review of Systems     Objective:   Physical Exam    wt  261 November 09, 2010 > 268 07/05/2011   pleasant amb wm in nad with freq throat clearing during interview and exam HEENT: nl dentition, turbinates, and orophanx. Nl external ear canals without cough reflex  NECK : without JVD/Nodes/TM/ nl carotid upstrokes bilaterally  LUNGS: no acc muscle use,  Distant bs with occ pops and squeaks bilaterally on insp but no wheeze and without cough on insp or exp maneuvers  CV: RRR no s3 or murmur or increase in P2, no edema  ABD: soft and nontender with nl excursion in the supine position. No bruits or organomegaly, bowel sounds nl  MS: warm without deformities, calf tenderness, cyanosis or clubbing Assessment:         Plan:

## 2011-07-05 NOTE — Assessment & Plan Note (Signed)
Not entirely clear what portion of his problem is asthma vs  Classic Upper airway cough syndrome, so named because it's frequently impossible to sort out how much is  CR/sinusitis with freq throat clearing (which can be related to primary GERD)   vs  causing  secondary (" extra esophageal")  GERD from wide swings in gastric pressure that occur with throat clearing, often  promoting self use of mint and menthol lozenges that reduce the lower esophageal sphincter tone and exacerbate the problem further in a cyclical fashion.   These are the same pts who not infrequently have failed to tolerate ace inhibitors,  dry powder inhalers or biphosphonates or report having reflux symptoms that don't respond to standard doses of PPI , and are easily confused as having aecopd or asthma flares,   For now continue max rx directed at both asthma and gerd  See instructions for specific recommendations which were reviewed directly with the patient who was given a copy with highlighter outlining the key components.   The proper method of use, as well as anticipated side effects, of this metered-dose inhaler are discussed and demonstrated to the patient. Improved only to 50% with coaching.  Rescue rx also reviewed for prn use

## 2011-10-04 ENCOUNTER — Encounter: Payer: Self-pay | Admitting: Cardiology

## 2011-10-18 ENCOUNTER — Telehealth: Payer: Self-pay | Admitting: *Deleted

## 2011-10-18 DIAGNOSIS — I517 Cardiomegaly: Secondary | ICD-10-CM

## 2011-10-18 NOTE — Telephone Encounter (Signed)
Dr Aundra Dubin received CXR report done 10/04/11 at [facility]. Impression: enlargement of cardiac silhouette. Question enlarged central pulmonary arteries which could reflect pulmonary hypertension. Minimal basilar atelectasais. Dr Aundra Dubin reviewed this report--his recommendation --would get echo to assess chamber sizes and fxn and look for pulmonary HTN. Discussed with pt. He agreed to have an echocardiogram.

## 2011-11-01 ENCOUNTER — Other Ambulatory Visit: Payer: Self-pay

## 2011-11-01 ENCOUNTER — Other Ambulatory Visit (HOSPITAL_COMMUNITY): Payer: Self-pay | Admitting: Cardiology

## 2011-11-01 ENCOUNTER — Ambulatory Visit (INDEPENDENT_AMBULATORY_CARE_PROVIDER_SITE_OTHER): Payer: BC Managed Care – PPO | Admitting: Internal Medicine

## 2011-11-01 ENCOUNTER — Encounter: Payer: Self-pay | Admitting: Internal Medicine

## 2011-11-01 ENCOUNTER — Ambulatory Visit (HOSPITAL_COMMUNITY): Payer: BC Managed Care – PPO | Attending: Cardiology

## 2011-11-01 VITALS — BP 122/72 | HR 82 | Temp 97.0°F | Ht 70.0 in | Wt 264.0 lb

## 2011-11-01 DIAGNOSIS — I517 Cardiomegaly: Secondary | ICD-10-CM | POA: Insufficient documentation

## 2011-11-01 DIAGNOSIS — J45909 Unspecified asthma, uncomplicated: Secondary | ICD-10-CM

## 2011-11-01 DIAGNOSIS — R9389 Abnormal findings on diagnostic imaging of other specified body structures: Secondary | ICD-10-CM

## 2011-11-01 DIAGNOSIS — Z794 Long term (current) use of insulin: Secondary | ICD-10-CM | POA: Insufficient documentation

## 2011-11-01 DIAGNOSIS — I2510 Atherosclerotic heart disease of native coronary artery without angina pectoris: Secondary | ICD-10-CM | POA: Insufficient documentation

## 2011-11-01 DIAGNOSIS — I10 Essential (primary) hypertension: Secondary | ICD-10-CM | POA: Insufficient documentation

## 2011-11-01 DIAGNOSIS — E119 Type 2 diabetes mellitus without complications: Secondary | ICD-10-CM | POA: Insufficient documentation

## 2011-11-01 MED ORDER — PERFLUTREN LIPID MICROSPHERE 6.52 MG/ML IV SUSP
10.0000 uL/kg | Freq: Once | INTRAVENOUS | Status: AC
  Administered 2011-11-01: 1.32 mg via INTRAVENOUS

## 2011-11-01 NOTE — Progress Notes (Signed)
Subjective:     Patient ID: Tony Cantu, male   DOB: 1951-03-30.   MRN: [id number]  Brief patient profile:  61 yowm quit smoking 2005 with cough that seemed better p quit and relatively well preserved ex tolerance including long walks [place] sob but never oob  With cough and variable c/w  asthmatic component.   HPI June 22, 2010 1st pulmonary office eval for cough, rarely productive, present daily > nightly no noct exac or am flares. symbicort started for flare 1 year maybe helped a little. No itching sneezing running nose. H/o bad gerd but says the acid heartburn has been eliminated with nexium.  imp was uacs rec Remember symbicort 2 puffs first thing in am and 2 puffs again in pm about 12 hours later  Work on inhaler technique: fexofendanine can be used if needed for itching sneezing and runny nose  GERD  diet     November 09, 2010 ov cough better but variably present now while supine, admits he doesn't always remember symbicort but seems to help.  rec work on hfa/ follow diet   07/05/2011 f/u ov/Tony Cantu cc sob/cough better when remembers to take symbicort, still variable dry cough/ not compliant with diet, allegra helps sinus drainage but no effect on the cough. rec Work on inhaler technique   Symbicort 160 Take 2 puffs first thing in am and then another 2 puffs about 12 hours later.  For breakthrough symptoms that you think might be asthma (cough, congestion, tightness) try proventil hfa 2 puffs every 4 hours as needed and you should expect relief within 5 minutes Stop singulair for at least a week or two to see if it makes any difference in your symptoms, if worse restart GERD diet     11/01/2011 f/u ov/Tony Cantu cc caught a cold w/in a week of stopping singulair so started back > improved.  Did not bring rescue with him and rarely needs it daytime, not really limited with adls but very sedentary.  Sleeping ok without nocturnal  or early am exacerbation  of respiratory  c/o's or need  for noct saba. Also denies any obvious fluctuation of symptoms with weather or environmental changes or other aggravating or alleviating factors except as outlined above.  ROS  At present neg for  any significant sore throat, dysphagia, itching, sneezing,  nasal congestion or excess/ purulent secretions,  fever, chills, sweats, unintended wt loss, pleuritic or exertional cp, hempoptysis, orthopnea pnd or leg swelling.  Also denies presyncope, palpitations, heartburn, abdominal pain, nausea, vomiting, diarrhea  or change in bowel or urinary habits, dysuria,hematuria,  rash, arthralgias, visual complaints, headache, numbness weakness or ataxia.   .   Allergies  1) ! Erythromycin  2) ! Sulfa    Past Medical History:  Allergic Rhinitis  > try off singulair 07/05/2011  Asthma  - HFA 75% 11/01/2011    Chronic cough.........................................................Marland KitchenWert  Morbid Obesity  - Calorie balance discussed November 09, 2010 > rec nutrition eval next  Diabetes  Hyperlipidemia         Objective:   Physical Exam    wt  261 November 09, 2010 > 268 07/05/2011  > 264 11/01/2011   pleasant amb wm in nad with freq throat clearing during interview and exam HEENT: nl dentition, turbinates, and orophanx. Nl external ear canals without cough reflex  NECK : without JVD/Nodes/TM/ nl carotid upstrokes bilaterally  LUNGS: no acc muscle use,  Distant bs with occ pops and squeaks bilaterally on insp but no wheeze  and without cough on insp or exp maneuvers  CV: RRR no s3 or murmur or increase in P2, no edema  ABD: soft and nontender with nl excursion in the supine position. No bruits or organomegaly, bowel sounds nl  MS: warm without deformities, calf tenderness, cyanosis or clubbing Assessment:         Plan:

## 2011-11-01 NOTE — Assessment & Plan Note (Signed)
Doing better with ? Flare off singulair vs unrelated uri but ok to try back off p pollen season  The proper method of use, as well as anticipated side effects, of this metered-dose inhaler are discussed and demonstrated to the patient. Improved to 75% with coaching    Each maintenance medication was reviewed in detail including most importantly the difference between maintenance and as needed and under what circumstances the prns are to be used.  Please see instructions for details which were reviewed in writing and the patient given a copy.

## 2011-11-01 NOTE — Patient Instructions (Signed)
Work on Doctor, hospital technique:  relax and gently blow all the way out then take a nice smooth deep breath back in, triggering the inhaler at same time you start breathing in.  Hold for up to 5 seconds if you can.  Rinse and gargle with water when done   If your mouth or throat starts to bother you,   I suggest you time the inhaler to your dental care and after using the inhaler(s) brush teeth and tongue with a baking soda containing toothpaste and when you rinse this out, gargle with it first to see if this helps your mouth and throat.     After the pollen season ok to try off the singulair to see if flares  Please schedule a follow up visit in 3 months but call sooner if needed

## 2011-11-09 ENCOUNTER — Telehealth: Payer: Self-pay | Admitting: Cardiology

## 2011-11-09 NOTE — Telephone Encounter (Signed)
New Problem:    Patient called in returning your call. Please call back.

## 2011-11-09 NOTE — Telephone Encounter (Signed)
Talked with pt about recent echo results

## 2011-12-14 ENCOUNTER — Encounter: Payer: Self-pay | Admitting: Cardiology

## 2012-01-17 ENCOUNTER — Encounter: Payer: Self-pay | Admitting: Cardiology

## 2012-01-31 ENCOUNTER — Encounter: Payer: Self-pay | Admitting: Cardiology

## 2012-02-02 ENCOUNTER — Encounter: Payer: Self-pay | Admitting: Cardiology

## 2012-02-21 ENCOUNTER — Encounter: Payer: Self-pay | Admitting: Cardiology

## 2012-02-21 ENCOUNTER — Ambulatory Visit (INDEPENDENT_AMBULATORY_CARE_PROVIDER_SITE_OTHER): Payer: BC Managed Care – PPO | Admitting: Cardiology

## 2012-02-21 VITALS — BP 114/68 | HR 72 | Ht 70.0 in | Wt 269.0 lb

## 2012-02-21 DIAGNOSIS — R609 Edema, unspecified: Secondary | ICD-10-CM | POA: Insufficient documentation

## 2012-02-21 DIAGNOSIS — E669 Obesity, unspecified: Secondary | ICD-10-CM

## 2012-02-21 DIAGNOSIS — Z9189 Other specified personal risk factors, not elsewhere classified: Secondary | ICD-10-CM

## 2012-02-21 DIAGNOSIS — Z789 Other specified health status: Secondary | ICD-10-CM

## 2012-02-21 DIAGNOSIS — R0602 Shortness of breath: Secondary | ICD-10-CM

## 2012-02-21 NOTE — Assessment & Plan Note (Signed)
Patient has no ischemic symptoms.  ECG is stable.  He does have a number of risk factors for coronary disease, including HTN, hyperlipidemia, diabetes, and obesity.  RIsk factors seem to be under good control except he needs to diet and exercise to lose weight.  Last stress test was in 6/09: cardiolite showed no ischemia or infarction.  He will continue current meds. Excellent BP and lipids.

## 2012-02-21 NOTE — Progress Notes (Signed)
Patient ID: Tony Cantu, male   DOB: July 18, 1951, 62 y.o.   MRN: [id number] PCP: Dr. Laurance Flatten  61 yo with history of HTN, DM, obesity presents for cardiology followup.   He had a cardiolite in 2009 that was a normal study.  He has been doing well in general.  He golfs occasionally at [place].  No chest pain or dyspnea with exertion.  BP and lipids are under good control.  He has been having trouble losing weight.  He and his wife are dieting but he has not been exercising that much, mainly because he gets right hip pain after walking for about 10 minutes.  He has noted swelling in his ankles for about 3 months.  2 months ago, he was started on Lasix and the swelling went down but has not resolved.    ECG: NSR, RBBB (no change)  Labs (7/12): hgbA1c 5.8 Labs (6/13): K 4.7, creatinine 0.93, LDL particle number 859, LDL 54, HDL 57  PMH: 1. Obesity 2. HTN 3. Diabetes mellitus with history of hypoglycemic episodes.  4. Hyperlipidemia 5. Cardiolite (6/09): Normal study.  6. Lumbar spine surgery in 1994 7. Hernia repair in 2003 8. Plantar fasciitis 9. Shoulder replacement  SH: Divorced in 2004 and remarried now.  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 Likes golf.   FH: Grandmother with MI in her 3s, mother with MI in her 43s, father died in his 74s (suicide).   ROS: All systems reivewed and negative except as per HPI.   Current Outpatient Prescriptions  Medication Sig Dispense Refill  . albuterol (PROVENTIL HFA) 108 (90 BASE) MCG/ACT inhaler Inhale 2 puffs into the lungs every 6 (six) hours as needed for wheezing.      Marland Kitchen amoxicillin (AMOXIL) 500 MG capsule as directed.       Marland Kitchen aspirin (ASPIRIN LOW DOSE) 81 MG EC tablet Take 81 mg by mouth daily.        Marland Kitchen atorvastatin (LIPITOR) 40 MG tablet Take 40 mg by mouth daily.        Marland Kitchen b complex vitamins tablet Take 1 tablet by mouth daily.        . BD INSULIN SYRINGE ULTRAFINE 31G X 5/16" 0.5 ML MISC       .  budesonide-formoterol (SYMBICORT) 160-4.5 MCG/ACT inhaler Inhale 2 puffs into the lungs as needed.        . Calcium Carbonate (CALTRATE 600 PO) Take by mouth daily.        . Cholecalciferol (VITAMIN D3) 2000 UNITS capsule Take 2,000 Units by mouth daily.        Marland Kitchen esomeprazole (NEXIUM) 40 MG capsule Take 40 mg by mouth daily before breakfast.        . fexofenadine (ALLEGRA) 180 MG tablet Take 180 mg by mouth daily as needed.       . furosemide (LASIX) 20 MG tablet Take 20 mg by mouth daily.       . Glucosamine-Chondroit-Vit C-Mn (GLUCOSAMINE 1500 COMPLEX) CAPS Take by mouth daily.        . Insulin Glargine (LANTUS [place]) Inject into the skin. 50 units am, 75 units pm      . insulin lispro (HUMALOG) 100 UNIT/ML injection Inject into the skin. Sliding scale       . Lancets (BD LANCET ULTRAFINE 30G) MISC       . montelukast (SINGULAIR) 10 MG tablet Take 10 mg by mouth at bedtime.        Marland Kitchen  Multiple Vitamins-Minerals (MATURE BALANCE PO) Take by mouth daily.        . Multiple Vitamins-Minerals (OCUVITE PO) Take by mouth daily.        . niacin 500 MG tablet Take 500 mg by mouth daily with breakfast.        . ONE TOUCH ULTRA TEST test strip       . ranitidine (ZANTAC) 300 MG tablet Take 300 mg by mouth at bedtime.        Marland Kitchen telmisartan (MICARDIS) 40 MG tablet Take 40 mg by mouth daily.        . Testosterone (ANDROGEL PUMP TD) Place onto the skin.        Marland Kitchen traMADol (ULTRAM) 50 MG tablet Take 50 mg by mouth every 6 (six) hours as needed.         BP 114/68  Pulse 72  Ht 5' 10"  (1.778 m)  Wt 122.018 kg (269 lb)  BMI 38.60 kg/m2  SpO2 96% General: NAD, obese.  Neck: No JVD, no thyromegaly or thyroid nodule.  Lungs: Clear to auscultation bilaterally with normal respiratory effort. CV: Nondisplaced PMI.  Heart regular S1/S2, no S3/S4, no murmur.  1+ edema 1/3 to knees bilaterally with some lower leg varicosities.  No carotid bruit.  Normal pedal pulses.  Abdomen: Soft, nontender, no hepatosplenomegaly, no  distention.  Neurologic: Alert and oriented x 3.  Psych: Normal affect. Extremities: No clubbing or cyanosis.

## 2012-02-21 NOTE — Patient Instructions (Addendum)
The current medical regimen is effective;  continue present plan and medications.  Your physician has requested that you have an echocardiogram. Echocardiography is a painless test that uses sound waves to create images of your heart. It provides your doctor with information about the size and shape of your heart and how well your heart's chambers and valves are working. This procedure takes approximately one hour. There are no restrictions for this procedure.  Follow up in 1 year with Dr [name].  You will receive a letter in the mail 2 months before you are due.  Please call us when you receive this letter to schedule your follow up appointment.

## 2012-02-21 NOTE — Assessment & Plan Note (Signed)
Needs weight loss.  Has hip pain with walking.  I suggested he try a stationary bike.

## 2012-02-21 NOTE — Assessment & Plan Note (Signed)
Patient has developed increased lower extremity edema.  This has improved with Lasix.  He does not have exertional dyspnea.  JVP is not elevated so suspect this is more likely due to venous insufficiency in the setting of abdominal obesity rather than CHF.  However, I will get a BNP today and will arrange for an echocardiogram.

## 2012-02-27 ENCOUNTER — Ambulatory Visit (HOSPITAL_COMMUNITY): Payer: BC Managed Care – PPO | Attending: Cardiology | Admitting: Radiology

## 2012-02-27 ENCOUNTER — Other Ambulatory Visit (HOSPITAL_COMMUNITY): Payer: Self-pay | Admitting: Cardiology

## 2012-02-27 DIAGNOSIS — I10 Essential (primary) hypertension: Secondary | ICD-10-CM | POA: Insufficient documentation

## 2012-02-27 DIAGNOSIS — R609 Edema, unspecified: Secondary | ICD-10-CM | POA: Insufficient documentation

## 2012-02-27 DIAGNOSIS — R0602 Shortness of breath: Secondary | ICD-10-CM

## 2012-02-27 DIAGNOSIS — I359 Nonrheumatic aortic valve disorder, unspecified: Secondary | ICD-10-CM | POA: Insufficient documentation

## 2012-02-27 DIAGNOSIS — R0609 Other forms of dyspnea: Secondary | ICD-10-CM | POA: Insufficient documentation

## 2012-02-27 DIAGNOSIS — Z87891 Personal history of nicotine dependence: Secondary | ICD-10-CM | POA: Insufficient documentation

## 2012-02-27 DIAGNOSIS — I2510 Atherosclerotic heart disease of native coronary artery without angina pectoris: Secondary | ICD-10-CM | POA: Insufficient documentation

## 2012-02-27 DIAGNOSIS — E785 Hyperlipidemia, unspecified: Secondary | ICD-10-CM | POA: Insufficient documentation

## 2012-02-27 DIAGNOSIS — E119 Type 2 diabetes mellitus without complications: Secondary | ICD-10-CM | POA: Insufficient documentation

## 2012-02-27 DIAGNOSIS — R0989 Other specified symptoms and signs involving the circulatory and respiratory systems: Secondary | ICD-10-CM

## 2012-02-27 DIAGNOSIS — I517 Cardiomegaly: Secondary | ICD-10-CM | POA: Insufficient documentation

## 2012-02-27 MED ORDER — PERFLUTREN PROTEIN A MICROSPH IV SUSP
3.0000 mL | Freq: Once | INTRAVENOUS | Status: AC
  Administered 2012-02-27: 3 mL via INTRAVENOUS

## 2012-02-27 NOTE — Progress Notes (Signed)
Echocardiogram performed. Optison given per protocol.

## 2012-03-06 ENCOUNTER — Telehealth: Payer: Self-pay | Admitting: Cardiology

## 2012-03-06 DIAGNOSIS — R0683 Snoring: Secondary | ICD-10-CM

## 2012-03-06 DIAGNOSIS — R400 Somnolence: Secondary | ICD-10-CM

## 2012-03-06 NOTE — Telephone Encounter (Signed)
Spoke with pt about recent echo results.

## 2012-03-06 NOTE — Telephone Encounter (Signed)
New problem:  Patient calling back for test results

## 2012-03-06 NOTE — Progress Notes (Signed)
Spoke with pt about results. He states his wife does note snoring and he does feel he has daytime sleepiness. He was evaluated at [facility] for sleep apnea around 8 years ago. He wants to think about having sleep study scheduled to evaluate for OSA. He states he will call back to schedule if he decides to have the sleep study done.

## 2012-04-01 ENCOUNTER — Ambulatory Visit (HOSPITAL_BASED_OUTPATIENT_CLINIC_OR_DEPARTMENT_OTHER): Payer: BC Managed Care – PPO

## 2012-05-01 ENCOUNTER — Ambulatory Visit (HOSPITAL_BASED_OUTPATIENT_CLINIC_OR_DEPARTMENT_OTHER): Payer: BC Managed Care – PPO | Attending: Cardiology

## 2012-05-01 VITALS — Ht 70.0 in | Wt 260.0 lb

## 2012-05-01 DIAGNOSIS — G4733 Obstructive sleep apnea (adult) (pediatric): Secondary | ICD-10-CM | POA: Insufficient documentation

## 2012-05-01 DIAGNOSIS — R0683 Snoring: Secondary | ICD-10-CM

## 2012-05-01 DIAGNOSIS — R400 Somnolence: Secondary | ICD-10-CM

## 2012-05-01 DIAGNOSIS — Z9989 Dependence on other enabling machines and devices: Secondary | ICD-10-CM

## 2012-05-09 DIAGNOSIS — G4710 Hypersomnia, unspecified: Secondary | ICD-10-CM

## 2012-05-09 DIAGNOSIS — G4730 Sleep apnea, unspecified: Secondary | ICD-10-CM

## 2012-05-10 NOTE — Procedures (Signed)
NAME:  Tony Cantu, Tony Cantu NO.:  [id number]  MEDICAL RECORD NO.:  [id number]          PATIENT TYPE:  OUT  LOCATION:  SLEEP CENTER                 FACILITY:  [facility]  PHYSICIAN:  Kathee Delton, MD,FCCPDATE OF BIRTH:  10/15/49  DATE OF STUDY:  05/01/2012                           NOCTURNAL POLYSOMNOGRAM  REFERRING PHYSICIAN:  [name], MD  INDICATION FOR STUDY:  Hypersomnia with sleep apnea.  EPWORTH SLEEPINESS SCORE:  4.  SLEEP ARCHITECTURE:  The patient had a total sleep time of 383 minutes with no slow-wave sleep and only 88 minutes of REM.  Sleep onset latency was normal at 28 minutes and REM onset did not occur until the titration portion of the study and therefore delayed.  Sleep efficiency was 74% during the diagnostic portion of the study, and 96% during the titration portion of the study.  RESPIRATORY DATA:  The patient underwent a split night protocol where he was found to have 151 obstructive events in the first 120 minutes of sleep.  This gave him an apnea-hypopnea index of 76 events per hour during the diagnostic portion of the study.  The events occurred in all body positions, and there was loud snoring noted throughout.  By protocol, the patient was fitted with a medium ResMed Quattro FX full face mask, and his CPAP titration was initiated.  He was increased as high as 14 cm of water pressure, but continued to have obstructive events.  He was then changed to bilevel because of concern for comfort, but even at a pressure of 19/14 he continued to have breakthrough events.  OXYGEN DATA:  There was O2 desaturation as low as 80% with the patient's obstructive events.  CARDIAC DATA:  Occasional PVC noted but no clinically significant arrhythmias were seen.  MOVEMENT-PARASOMNIA:  The patient had mild to moderate numbers of leg jerks, but very little sleep disruption.  There were no abnormal behavior seen.  IMPRESSIONS-RECOMMENDATIONS: 1. Split  night study reveals severe obstructive sleep apnea/hypopnea     syndrome with an AHI of 76 events per hour and O2 desaturation as     low as 80% during the diagnostic portion of the study.  He was then     fitted with a ResMed medium Quattro FX full face mask, and titrated     as high as 14 cm of water with persistent breakthrough events.  He     was changed to bilevel and titrated as high as 19/14 but continued     to have breakthrough events.  I would recommend starting with CPAP     at a moderate pressure level for desensitization, and then     optimizing his pressure at a later date with either an auto device     at home, or in the sleep center with a formal CPAP     titration.  The patient should also be encouraged to work     aggressively on weight loss. 2. Occasional premature ventricular contraction  noted, but no     clinically significant arrhythmias were seen.     Kathee Delton, MD,FCCP Diplomate, American Board of Sleep Medicine    [facility]/MED[facility]  D:  05/09/2012 08:30:15  T:  05/10/2012 03:59:58  Job:  [id number]

## 2012-05-14 ENCOUNTER — Telehealth: Payer: Self-pay | Admitting: Cardiology

## 2012-05-14 ENCOUNTER — Telehealth: Payer: Self-pay | Admitting: *Deleted

## 2012-05-14 DIAGNOSIS — G4730 Sleep apnea, unspecified: Secondary | ICD-10-CM

## 2012-05-14 NOTE — Telephone Encounter (Signed)
See below. Please refer to Dr. Gwenette Greet for CPAP. ----- Message ----- From: Kathee Delton, MD Sent: 05/11/2012 6:36 PM To: Larey Dresser, MD [place], this pt's sleep study showed severe osa with AHI 76/hr. Will need treatment with cpap. Please refer for evaluation, and let me know if more than 2 weeks. I would be happy to facilitate an earlier time if this is the case.   05/14/12--spoke with pt.

## 2012-05-14 NOTE — Telephone Encounter (Signed)
Spoke with pt about appt with Dr Gwenette Greet and CPAP.

## 2012-05-14 NOTE — Telephone Encounter (Signed)
Pt agreed to referral to Dr Gwenette Greet. Pt is going out of town and would like an appt with Dr Gwenette Greet before Sept 28,2013.

## 2012-05-14 NOTE — Telephone Encounter (Signed)
PT RN CALL TO Webb Silversmith, PS CALL BACK @ [phone] UNTIL 1145A AND AFTER 130PM

## 2012-05-16 ENCOUNTER — Encounter: Payer: Self-pay | Admitting: Pulmonary Disease

## 2012-05-16 ENCOUNTER — Ambulatory Visit (INDEPENDENT_AMBULATORY_CARE_PROVIDER_SITE_OTHER): Payer: BC Managed Care – PPO | Admitting: Pulmonary Disease

## 2012-05-16 ENCOUNTER — Encounter: Payer: Self-pay | Admitting: Cardiology

## 2012-05-16 VITALS — BP 118/70 | HR 74 | Temp 98.3°F | Ht 70.0 in | Wt 269.4 lb

## 2012-05-16 DIAGNOSIS — G4733 Obstructive sleep apnea (adult) (pediatric): Secondary | ICD-10-CM | POA: Insufficient documentation

## 2012-05-16 NOTE — Progress Notes (Signed)
  Subjective:    Patient ID: Tony Cantu, male    DOB: 12-14-1950, 61 y.o.   MRN: [id number]  HPI The patient is a very pleasant 61 year old gentleman who I been asked to see for management of obstructive sleep apnea.  He has had a sleep study, and was found to have severe sleep apnea with an AHI of 76 events per hour.  He was started on CPAP, however did not have enough time to reach therapeutic pressure.  He had breakthrough events even on 14 cm of pressure.  The patient is been noted to have a very loud snoring during sleep by his wife, as well as an abnormal breathing pattern.  He has frequent awakenings at night, and is not rested in the mornings upon arising.  He has significant sleepiness during the day with periods of activity, and has even fallen asleep sitting on the toilet.  He also has sleepiness with driving longer distances.  The patient states that his weight is up about 20 pounds over the last 2 years, and his Epworth score today is 13.  Sleep Questionnaire: What time do you typically go to bed?( Between what hours) 9:30 10:30 pm How long does it take you to fall asleep? 10 min How many times during the night do you wake up? What time do you get out of bed to start your day? 0530 Do you drive or operate heavy machinery in your occupation? No How much has your weight changed (up or down) over the past two years? (In pounds) 20 lb (9.072 kg) Have you ever had a sleep study before? Yes If yes, location of study? [facility] If yes, date of study? 05-01-12 Do you currently use CPAP? No Do you wear oxygen at any time? No    Review of Systems  Constitutional: Negative for fever and unexpected weight change.  HENT: Positive for rhinorrhea. Negative for ear pain, nosebleeds, congestion, sore throat, sneezing, trouble swallowing, dental problem, postnasal drip and sinus pressure.   Eyes: Negative for redness and itching.  Respiratory: Positive for cough and wheezing. Negative for chest tightness and  shortness of breath.   Cardiovascular: Positive for leg swelling. Negative for palpitations.  Gastrointestinal: Negative for nausea and vomiting.  Genitourinary: Negative for dysuria.  Musculoskeletal: Negative for joint swelling.  Skin: Negative for rash.  Neurological: Negative for headaches.  Hematological: Does not bruise/bleed easily.  Psychiatric/Behavioral: Negative for dysphoric mood. The patient is not nervous/anxious.        Objective:   Physical Exam Constitutional:  Well developed, no acute distress  HENT:  Nares patent without discharge, but narrowed.  Oropharynx without exudate, palate and uvula are thick and elongated, small posterior space  Eyes:  Perrla, eomi, no scleral icterus  Neck:  No JVD, no TMG  Cardiovascular:  Normal rate, regular rhythm, no rubs or gallops.  No murmurs        Intact distal pulses  Pulmonary :  Normal breath sounds, no stridor or respiratory distress   No rales, rhonchi, or wheezing  Abdominal:  Soft, nondistended, bowel sounds present.  No tenderness noted.   Musculoskeletal:  mild lower extremity edema noted.  Lymph Nodes:  No cervical lymphadenopathy noted  Skin:  No cyanosis noted  Neurologic:  Alert, appropriate, moves all 4 extremities without obvious deficit.         Assessment & Plan:

## 2012-05-16 NOTE — Assessment & Plan Note (Signed)
The patient has severe obstructive sleep apnea by his recent sleep study, and will need aggressive treatment with CPAP as well as weight loss.  I have had a long discussion with him about sleep apnea, including its impact to his quality of life and cardiovascular health.  The patient is agreeable to starting on the device. I will set the patient up on cpap at a moderate pressure level to allow for desensitization, and will troubleshoot the device over the next 4-6weeks if needed.  The pt is to call me if having issues with tolerance.  Will then optimize the pressure once patient is able to wear cpap on a consistent basis.

## 2012-05-16 NOTE — Patient Instructions (Addendum)
Will start on cpap at a moderate pressure level to allow for desensitization. Work on weight loss followup with me in 6 weeks, but call if having tolerance issues.

## 2012-05-21 ENCOUNTER — Telehealth: Payer: Self-pay | Admitting: Pulmonary Disease

## 2012-05-21 NOTE — Telephone Encounter (Signed)
Forward  5 pages from [facility] to Dr. Danton Sewer for review on 05-21-12 ym

## 2012-06-07 ENCOUNTER — Telehealth: Payer: Self-pay | Admitting: Pulmonary Disease

## 2012-06-07 NOTE — Telephone Encounter (Signed)
I spoke with pt and he stated sometimes his BS is low and occasionally high since starting on cpap. I advised pt his cpap has nothing to do with his BS. He stated he didn't think so but just making sure. He will call his pcp regarding his BS. Nothing further was needed. Will forward to [facility] as an FYI pt called regarding this

## 2012-06-07 NOTE — Telephone Encounter (Signed)
Error.Tony Cantu ° °

## 2012-06-08 NOTE — Telephone Encounter (Signed)
Treatment of sleep apnea usually results in lower blood sugars, especially in the am's.   Typically improves DM

## 2012-07-03 ENCOUNTER — Other Ambulatory Visit: Payer: Self-pay | Admitting: Pulmonary Disease

## 2012-07-03 ENCOUNTER — Encounter: Payer: Self-pay | Admitting: Pulmonary Disease

## 2012-07-03 ENCOUNTER — Ambulatory Visit (INDEPENDENT_AMBULATORY_CARE_PROVIDER_SITE_OTHER): Payer: BC Managed Care – PPO | Admitting: Pulmonary Disease

## 2012-07-03 VITALS — BP 90/52 | HR 70 | Temp 98.0°F | Ht 70.0 in | Wt 271.0 lb

## 2012-07-03 DIAGNOSIS — G4733 Obstructive sleep apnea (adult) (pediatric): Secondary | ICD-10-CM

## 2012-07-03 NOTE — Patient Instructions (Addendum)
Will optimize pressure on the auto setting for the next 2 weeks.  Will get download and let you know the results. Work on weight loss followup with me in 65mo if doing well.

## 2012-07-03 NOTE — Progress Notes (Signed)
  Subjective:    Patient ID: Tony Cantu, male    DOB: [birth year], 61 y.o.   MRN: [id number]  HPI Patient comes in today for followup of his obstructive sleep apnea.  He is wearing CPAP compliantly, and is having no issues with his mask fit or pressure.  He feels he is sleeping much better, with greatly improved daytime alertness.  He does have some positional issues with his mask, and I told him to be sure and keep up with the cushion changes.   Review of Systems  Constitutional: Negative for fever and unexpected weight change.  HENT: Positive for congestion. Negative for ear pain, nosebleeds, sore throat, rhinorrhea, sneezing, trouble swallowing, dental problem, postnasal drip and sinus pressure.   Eyes: Negative for redness and itching.  Respiratory: Positive for wheezing. Negative for cough, chest tightness and shortness of breath.   Cardiovascular: Negative for palpitations and leg swelling.  Gastrointestinal: Negative for nausea and vomiting.  Genitourinary: Negative for dysuria.  Musculoskeletal: Negative for joint swelling.  Skin: Negative for rash.  Neurological: Negative for headaches.  Hematological: Does not bruise/bleed easily.  Psychiatric/Behavioral: Negative for dysphoric mood. The patient is not nervous/anxious.        Objective:   Physical Exam Obese male in no acute distress Nose without purulent discharge noted No skin breakdown or pressure necrosis from the CPAP mask Lower extremities without edema, cyanosis Alert and oriented, moves all 4 extremities.       Assessment & Plan:

## 2012-07-03 NOTE — Assessment & Plan Note (Signed)
The patient is doing very well with CPAP, and has seen significant improvement in his sleep and daytime alertness.  I have encouraged him to keep up with mask changes and supplies, and to work aggressively on weight loss.  We also need to optimize his pressure. Care Plan:  At this point, will arrange for the patient's machine to be changed over to auto mode for 2 weeks to optimize their pressure.  I will review the downloaded data once sent by dme, and also evaluate for compliance, leaks, and residual osa.  I will call the patient and dme to discuss the results, and have the patient's machine set appropriately.  This will serve as the pt's cpap pressure titration.

## 2012-07-10 ENCOUNTER — Telehealth: Payer: Self-pay | Admitting: Pulmonary Disease

## 2012-07-10 DIAGNOSIS — G4733 Obstructive sleep apnea (adult) (pediatric): Secondary | ICD-10-CM

## 2012-07-10 NOTE — Telephone Encounter (Signed)
Unable to reach patient on home phone  [phone] (H)--vmail unable to receive messages at this time.  Left vmail on mobile number [phone] (M) to return call.

## 2012-07-11 NOTE — Telephone Encounter (Signed)
Pt reports that CPAP was changed to Auto setting this past Friday and since then he has not been able to tolerate cpap.  Full face mask is blowing off his face and has been unable to keep it from leaking.  Was only able to tolerate this for 15 minutes at a time.  Please advise on what to do.

## 2012-07-11 NOTE — Telephone Encounter (Signed)
Patient returning call.

## 2012-07-12 NOTE — Telephone Encounter (Signed)
This probably means that he is needing much higher pressure during the night in order to treat his sleep apnea. We can limit pressure on auto a little bit to see if this helps with his tolerance.  We need to find his optimal pressure. Please send order to [facility] to change his auto asap to 5-15cm and see if he tolerates this better.

## 2012-07-12 NOTE — Telephone Encounter (Signed)
Per Golden CircleDarrick Penna from [facility] states that pt is not able to tolerate auto CPAP.  Pt requests to have to switched back to 10 cm.  Darrick Penna can be reached at [phone].  Satira Anis

## 2012-07-12 NOTE — Telephone Encounter (Signed)
Pt called back following up from yesterday's call.  Please call him on his cell [phone]). Tony Cantu

## 2012-07-12 NOTE — Telephone Encounter (Signed)
LMTCB

## 2012-07-12 NOTE — Telephone Encounter (Signed)
Still awaiting KC's response Please advise thanks!

## 2012-07-12 NOTE — Telephone Encounter (Signed)
Spoke with pt and notified of recs per [facility] He verbalized understanding and states is willing to try auto 5-15 Order was sent to [facility] Nothing further needed

## 2012-11-01 ENCOUNTER — Encounter: Payer: Self-pay | Admitting: Family Medicine

## 2012-11-02 NOTE — Telephone Encounter (Signed)
Called Shelby at [facility] she is going to speak with Dr. Laurance Flatten and call patient

## 2012-12-05 ENCOUNTER — Telehealth: Payer: Self-pay | Admitting: Family Medicine

## 2012-12-10 ENCOUNTER — Other Ambulatory Visit (INDEPENDENT_AMBULATORY_CARE_PROVIDER_SITE_OTHER): Payer: BC Managed Care – PPO

## 2012-12-10 DIAGNOSIS — Z1212 Encounter for screening for malignant neoplasm of rectum: Secondary | ICD-10-CM

## 2012-12-10 NOTE — Progress Notes (Signed)
Patient dropped off fobt

## 2012-12-26 ENCOUNTER — Other Ambulatory Visit: Payer: Self-pay | Admitting: *Deleted

## 2012-12-26 MED ORDER — TESTOSTERONE 20.25 MG/1.25GM (1.62%) TD GEL: 2.0000 "application " | Freq: Every day | TRANSDERMAL | Status: DC

## 2013-01-01 ENCOUNTER — Ambulatory Visit (INDEPENDENT_AMBULATORY_CARE_PROVIDER_SITE_OTHER): Payer: BC Managed Care – PPO | Admitting: Pulmonary Disease

## 2013-01-01 ENCOUNTER — Telehealth: Payer: Self-pay | Admitting: Pulmonary Disease

## 2013-01-01 ENCOUNTER — Ambulatory Visit (HOSPITAL_BASED_OUTPATIENT_CLINIC_OR_DEPARTMENT_OTHER): Payer: BC Managed Care – PPO | Attending: Pulmonary Disease | Admitting: Radiology

## 2013-01-01 ENCOUNTER — Encounter: Payer: Self-pay | Admitting: Pulmonary Disease

## 2013-01-01 VITALS — BP 120/60 | HR 66 | Temp 96.9°F | Ht 70.0 in | Wt 276.2 lb

## 2013-01-01 DIAGNOSIS — G4733 Obstructive sleep apnea (adult) (pediatric): Secondary | ICD-10-CM

## 2013-01-01 DIAGNOSIS — Z9989 Dependence on other enabling machines and devices: Secondary | ICD-10-CM

## 2013-01-01 NOTE — Assessment & Plan Note (Signed)
The patient has seen significant improvement in his sleep and daytime alertness with CPAP, but is having some tolerance issues that need to be addressed.  We'll get him to the sleep center to have a formal mask fitting, and we'll also find out if he is on the automatic setting currently or a fixed pressure.  He also needs to have his pressure optimized.  Finally, I have encouraged him to work aggressively on weight loss.

## 2013-01-01 NOTE — Telephone Encounter (Signed)
Nothing further is needed.

## 2013-01-01 NOTE — Patient Instructions (Addendum)
Will get your pressure optimized for you, and let you know the results. Will setup a mask fitting session at the sleep center. Work on weight loss followup with me in one year, but please call if things are not going well.

## 2013-01-01 NOTE — Progress Notes (Signed)
  Subjective:    Patient ID: Tony Cantu, male    DOB: 1950-10-25, 62 y.o.   MRN: [id number]  HPI The patient continued in for followup of his obstructive sleep apnea.  He is wearing CPAP compliantly, but is having some issues with his mask fit.  He is having to pull the mask tightly in order to keep from leaking, and is interested in trying something different.  He also never had a download on the automatic settings sent to [place], and it is unclear if he is still on the automatic setting versus a fixed pressure?  He feels that he is sleeping much better on the device, with very good daytime alertness.   Review of Systems  Constitutional: Negative for fever and unexpected weight change.  HENT: Positive for congestion, rhinorrhea, sneezing and postnasal drip. Negative for ear pain, nosebleeds, sore throat, trouble swallowing, dental problem and sinus pressure.        Allergies   Eyes: Negative for redness and itching.  Respiratory: Negative for cough, chest tightness, shortness of breath and wheezing.   Cardiovascular: Negative for palpitations and leg swelling.  Gastrointestinal: Negative for nausea and vomiting.  Genitourinary: Negative for dysuria.  Musculoskeletal: Negative for joint swelling.  Skin: Negative for rash.  Neurological: Negative for headaches.  Hematological: Does not bruise/bleed easily.  Psychiatric/Behavioral: Negative for dysphoric mood. The patient is not nervous/anxious.        Objective:   Physical Exam Obese male in no acute distress Nose without purulence or discharge noted No skin breakdown or pressure necrosis from the CPAP mask Neck without lymphadenopathy or thyromegaly Lower extremities with no significant edema, no cyanosis Alert, does not appear to be sleepy, moves all 4 extremities.       Assessment & Plan:

## 2013-01-01 NOTE — Telephone Encounter (Signed)
Spoke to pt he was given newer equiptment by [facility] new RT and was very pleased he saw him but wanted to let us know the old RT did not do a very good job of helping him before Joellen Jersey

## 2013-01-02 ENCOUNTER — Telehealth: Payer: Self-pay | Admitting: Pulmonary Disease

## 2013-01-02 DIAGNOSIS — G4733 Obstructive sleep apnea (adult) (pediatric): Secondary | ICD-10-CM

## 2013-01-02 NOTE — Telephone Encounter (Signed)
Order was sent to DME

## 2013-01-07 ENCOUNTER — Other Ambulatory Visit (INDEPENDENT_AMBULATORY_CARE_PROVIDER_SITE_OTHER): Payer: BC Managed Care – PPO

## 2013-01-07 ENCOUNTER — Other Ambulatory Visit: Payer: Self-pay | Admitting: Family Medicine

## 2013-01-07 DIAGNOSIS — E119 Type 2 diabetes mellitus without complications: Secondary | ICD-10-CM

## 2013-01-07 DIAGNOSIS — R5383 Other fatigue: Secondary | ICD-10-CM

## 2013-01-07 DIAGNOSIS — E559 Vitamin D deficiency, unspecified: Secondary | ICD-10-CM

## 2013-01-07 DIAGNOSIS — I10 Essential (primary) hypertension: Secondary | ICD-10-CM

## 2013-01-07 DIAGNOSIS — E785 Hyperlipidemia, unspecified: Secondary | ICD-10-CM

## 2013-01-07 DIAGNOSIS — R5381 Other malaise: Secondary | ICD-10-CM

## 2013-01-07 DIAGNOSIS — E291 Testicular hypofunction: Secondary | ICD-10-CM

## 2013-01-07 LAB — POCT CBC
Granulocyte percent: 69.4 %G (ref 37–80)
HCT, POC: 48.2 % (ref 43.5–53.7)
Hemoglobin: 16.2 g/dL (ref 14.1–18.1)
MCV: 101.2 fL — AB (ref 80–97)
POC Granulocyte: 4.9 (ref 2–6.9)
RDW, POC: 13.3 %
WBC: 7 10*3/uL (ref 4.6–10.2)

## 2013-01-07 LAB — HEPATIC FUNCTION PANEL
ALT: 24 U/L (ref 0–53)
AST: 25 U/L (ref 0–37)
Alkaline Phosphatase: 59 U/L (ref 39–117)
Bilirubin, Direct: 0.2 mg/dL (ref 0.0–0.3)
Indirect Bilirubin: 0.4 mg/dL (ref 0.0–0.9)
Total Bilirubin: 0.6 mg/dL (ref 0.3–1.2)

## 2013-01-07 LAB — BASIC METABOLIC PANEL WITH GFR
BUN: 16 mg/dL (ref 6–23)
CO2: 27 mEq/L (ref 19–32)
GFR, Est African American: >89 mL/min
Glucose, Bld: 130 mg/dL — ABNORMAL HIGH (ref 70–99)
Potassium: 4.9 mEq/L (ref 3.5–5.3)
Sodium: 137 mEq/L (ref 135–145)

## 2013-01-07 LAB — POCT GLYCOSYLATED HEMOGLOBIN (HGB A1C): Hemoglobin A1C: 5.5

## 2013-01-07 LAB — POCT UA - MICROALBUMIN: Microalbumin Ur, POC: NEGATIVE mg/dL

## 2013-01-07 NOTE — Progress Notes (Signed)
Patient came in for labs only.

## 2013-01-08 LAB — NMR LIPOPROFILE WITH LIPIDS
Cholesterol, Total: 107 mg/dL (ref <200)
HDL Particle Number: 39.1 umol/L (ref >=30.5)
Large HDL-P: 9 umol/L (ref >=4.8)
Large VLDL-P: 2.1 nmol/L (ref <=2.7)
Small LDL Particle Number: 532 nmol/L — ABNORMAL HIGH (ref <=527)
Triglycerides: 47 mg/dL (ref <150)
VLDL Size: 50.7 nm — ABNORMAL HIGH (ref <=46.6)

## 2013-01-09 LAB — TESTOSTERONE, TOTAL AND FREE DIRECT MEASURE: Free Testosterone, Direct: 3 pg/mL — ABNORMAL LOW (ref 3.8–34.2)

## 2013-01-10 ENCOUNTER — Encounter: Payer: Self-pay | Admitting: Family Medicine

## 2013-01-10 ENCOUNTER — Telehealth: Payer: Self-pay | Admitting: Family Medicine

## 2013-01-10 ENCOUNTER — Ambulatory Visit (INDEPENDENT_AMBULATORY_CARE_PROVIDER_SITE_OTHER): Payer: BC Managed Care – PPO | Admitting: Family Medicine

## 2013-01-10 VITALS — BP 105/60 | HR 66 | Temp 97.2°F | Ht 69.25 in | Wt 270.8 lb

## 2013-01-10 DIAGNOSIS — M7712 Lateral epicondylitis, left elbow: Secondary | ICD-10-CM

## 2013-01-10 DIAGNOSIS — E669 Obesity, unspecified: Secondary | ICD-10-CM

## 2013-01-10 DIAGNOSIS — E349 Endocrine disorder, unspecified: Secondary | ICD-10-CM

## 2013-01-10 DIAGNOSIS — I10 Essential (primary) hypertension: Secondary | ICD-10-CM | POA: Insufficient documentation

## 2013-01-10 DIAGNOSIS — E785 Hyperlipidemia, unspecified: Secondary | ICD-10-CM

## 2013-01-10 DIAGNOSIS — E291 Testicular hypofunction: Secondary | ICD-10-CM

## 2013-01-10 DIAGNOSIS — E119 Type 2 diabetes mellitus without complications: Secondary | ICD-10-CM

## 2013-01-10 DIAGNOSIS — J309 Allergic rhinitis, unspecified: Secondary | ICD-10-CM

## 2013-01-10 DIAGNOSIS — M7710 Lateral epicondylitis, unspecified elbow: Secondary | ICD-10-CM

## 2013-01-10 MED ORDER — FUROSEMIDE 20 MG PO TABS: 20.0000 mg | ORAL_TABLET | Freq: Every day | ORAL | Status: DC

## 2013-01-10 MED ORDER — NIACIN ER (ANTIHYPERLIPIDEMIC) 500 MG PO TBCR: 500.0000 mg | EXTENDED_RELEASE_TABLET | Freq: Every day | ORAL | Status: DC

## 2013-01-10 MED ORDER — "INSULIN SYRINGE-NEEDLE U-100 31G X 5/16"" 0.5 ML MISC": 8.0000 | Status: DC

## 2013-01-10 MED ORDER — PANTOPRAZOLE SODIUM 40 MG PO TBEC: 40.0000 mg | DELAYED_RELEASE_TABLET | Freq: Every day | ORAL | Status: DC

## 2013-01-10 MED ORDER — GLUCOSE BLOOD VI STRP: ORAL_STRIP | Status: DC

## 2013-01-10 MED ORDER — BD LANCET ULTRAFINE 30G MISC: 1.0000 | Status: DC

## 2013-01-10 MED ORDER — MONTELUKAST SODIUM 10 MG PO TABS: 10.0000 mg | ORAL_TABLET | Freq: Every day | ORAL | Status: DC

## 2013-01-10 MED ORDER — TRAMADOL HCL 50 MG PO TABS: 50.0000 mg | ORAL_TABLET | Freq: Four times a day (QID) | ORAL | Status: DC | PRN

## 2013-01-10 MED ORDER — BUDESONIDE-FORMOTEROL FUMARATE 160-4.5 MCG/ACT IN AERO: 2.0000 | INHALATION_SPRAY | Freq: Two times a day (BID) | RESPIRATORY_TRACT | Status: DC

## 2013-01-10 MED ORDER — INSULIN GLARGINE 100 UNIT/ML ~~LOC~~ SOLN: 75.0000 [IU] | Freq: Two times a day (BID) | SUBCUTANEOUS | Status: DC

## 2013-01-10 MED ORDER — ATORVASTATIN CALCIUM 40 MG PO TABS: 40.0000 mg | ORAL_TABLET | Freq: Every day | ORAL | Status: DC

## 2013-01-10 NOTE — Progress Notes (Signed)
Subjective:    Patient ID: Tony Cantu, male    DOB: [birth year], 62 y.o.   MRN: [id number]  HPI This patient presents for recheck of multiple medical problems. No one accompanies the patient today.  Patient Active Problem List   Diagnosis Date Noted  . OSA (obstructive sleep apnea) 05/16/2012  . Edema 02/21/2012  . At risk for coronary artery disease 03/20/2011  . Obesity 03/20/2011  . WEIGHT GAIN, ABNORMAL 11/09/2010  . HYPERLIPIDEMIA 06/22/2010  . ALLERGIC RHINITIS 06/22/2010  . ASTHMA 06/22/2010  . COUGH 06/22/2010    In addition, labs were reviewed with patient today.  The allergies, current medications, past medical history, surgical history, family and social history are reviewed.  Immunizations reviewed.  Health maintenance reviewed.  The following items are outstanding: Health maintenance is up to date.      Review of Systems  Constitutional: Negative.   HENT: Positive for rhinorrhea, sneezing and postnasal drip. Negative for ear pain.   Respiratory: Positive for cough (dry). Negative for shortness of breath.   Cardiovascular: Positive for leg swelling (bilateral).  Gastrointestinal: Negative.   Genitourinary: Positive for frequency. Negative for dysuria.  Musculoskeletal: Positive for arthralgias (bilateral arm numbness when lying down,L elbow, R hip).  Allergic/Immunologic: Positive for environmental allergies (seasonal).  Neurological: Positive for headaches (due to allergies).  Psychiatric/Behavioral: Negative.    Specifically some burning and stinging in the left elbow. He has been playing more golf over the past 4 weeks. Previous labs were reviewed with patient. Of note testosterone levels were low, he has been using AndroGel.    Objective:   Physical Exam BP 105/60  Pulse 66  Temp(Src) 97.2 F (36.2 C) (Oral)  Ht 5' 9.25" (1.759 m)  Wt 270 lb 12.8 oz (122.834 kg)  BMI 39.7 kg/m2  The patient appeared well nourished and normally developed,  alert and oriented to time and place. Speech, behavior and judgement appear normal. Vital signs as documented.  Head exam is unremarkable. No scleral icterus or pallor noted. He does have nasal congestion bilaterally. Neck is without jugular venous distension, thyromegally, or carotid bruits. Carotid upstrokes are brisk bilaterally. No cervical adenopathy. Lungs are clear anteriorly and posteriorly to auscultation. Normal respiratory effort. No wheezes. Cardiac exam reveals regular rate and rhythm at 72 per. First and second heart sounds normal.  No murmurs, rubs or gallops.  Abdominal exam reveals normal bowl sounds, no masses, no organomegaly and no aortic enlargement. No inguinal adenopathy. There is obesity even though he's lost some weight. Extremities are nonedematous and both femoral and pedal pulses are normal. Skin without pallor or jaundice. There is a nevus of his right posterior shoulder that apparently has gotten bigger in size.  Warm and dry, without rash. Neurologic exam reveals normal deep tendon reflexes and normal sensation. Diabetic foot exam was done          Assessment & Plan:  1. Essential hypertension, benign Continue current medication  2. ALLERGIC RHINITIS Try Nasacort AQ one spray each nostril day  3. HYPERLIPIDEMIA  4. Obesity Continue aggressive therapeutic lifestyle change  5. Diabetes type 2, controlled Aggressive therapeutic lifestyle  6. Tennis elbow syndrome, left Use tennis elbow brace as direct  7. Testosterone deficiency Will try axiron and place of AndroGel Information sheet given A handwritten prescription was given to go with patient with savings coupon He will follow up clinical pharmacist in 4 weeks  8.Abnormal nevus left shoulder He'll return for excision of this  Patient Instructions  Continue  current meds and therapeutic lifestyle changes Use axiron on as directed

## 2013-01-10 NOTE — Telephone Encounter (Signed)
Script given per Asencion Gowda

## 2013-01-10 NOTE — Progress Notes (Deleted)
  Subjective:    Patient ID: Tony Cantu, male    DOB: May 18, 1951, 63 y.o.   MRN: [id number]  HPI    Review of Systems     Objective:   Physical Exam        Assessment & Plan:

## 2013-01-10 NOTE — Patient Instructions (Addendum)
Continue current meds and therapeutic lifestyle changes Use axiron on as directed

## 2013-01-11 NOTE — Telephone Encounter (Signed)
error 

## 2013-01-17 ENCOUNTER — Other Ambulatory Visit: Payer: Self-pay | Admitting: *Deleted

## 2013-01-17 ENCOUNTER — Other Ambulatory Visit: Payer: Self-pay | Admitting: Family Medicine

## 2013-01-17 ENCOUNTER — Telehealth: Payer: Self-pay | Admitting: Family Medicine

## 2013-01-17 MED ORDER — INSULIN LISPRO 100 UNIT/ML ~~LOC~~ SOLN: 20.0000 [IU] | SUBCUTANEOUS | Status: DC | PRN

## 2013-01-17 MED ORDER — TRAMADOL HCL 50 MG PO TABS: 50.0000 mg | ORAL_TABLET | Freq: Four times a day (QID) | ORAL | Status: DC | PRN

## 2013-01-17 MED ORDER — "INSULIN SYRINGE-NEEDLE U-100 31G X 5/16"" 0.5 ML MISC": 8.0000 | Status: DC

## 2013-01-17 NOTE — Telephone Encounter (Signed)
We were going to switch him to Diovan HCT, I believe I discussed this with Zigmund Daniel this morning Please talk with her about what was done, and call me if necessary

## 2013-01-17 NOTE — Telephone Encounter (Signed)
Ins will no longer cover generic for micardis - please change to( candesartan-hctz or irbesartan or losartan- hctz, or benicar-hct, diovan -hctz) Per insurance and patient.  Also need #180 of the 66m syringes.  Takes 40 qd of the micardis now- please advise and send to [place] Pandey, lpn- when done.  Mail order for 90 mday- please print and sign

## 2013-01-18 NOTE — Telephone Encounter (Signed)
This has been resolved - the new diovan 160 one a day and his syringes were written from a script pad per [facility].

## 2013-01-22 ENCOUNTER — Telehealth: Payer: Self-pay | Admitting: *Deleted

## 2013-01-22 NOTE — Telephone Encounter (Signed)
Moving to Diovan HCT is not equivalent to micardis 40, this would be equivalent to Diovan 160 or Benicar 20 Please discuss this with patient and let me know what he would like to do.

## 2013-01-22 NOTE — Telephone Encounter (Signed)
Patient is taking Micardis 32m.  Which strength of Diovan/HCTZ would you like to prescribe?  He needs a 90 day script printed so that he can mail it in.

## 2013-01-23 ENCOUNTER — Ambulatory Visit (INDEPENDENT_AMBULATORY_CARE_PROVIDER_SITE_OTHER): Payer: BC Managed Care – PPO | Admitting: Physician Assistant

## 2013-01-23 ENCOUNTER — Encounter: Payer: Self-pay | Admitting: Physician Assistant

## 2013-01-23 VITALS — BP 115/52 | HR 96 | Temp 97.0°F | Ht 70.0 in | Wt 269.0 lb

## 2013-01-23 DIAGNOSIS — D239 Other benign neoplasm of skin, unspecified: Secondary | ICD-10-CM

## 2013-01-23 DIAGNOSIS — D229 Melanocytic nevi, unspecified: Secondary | ICD-10-CM

## 2013-01-23 NOTE — Progress Notes (Signed)
Subjective:     Patient ID: Tony Cantu, male   DOB: Jun 17, 1951, 62 y.o.   MRN: [id number]  HPI  Pt with dark mole to the post R shoulder Due to recent change it was recommended removal.  Denies hx of prev abnl moles No FH melanoma  Review of Systems  All other systems reviewed and are negative.       Objective:   Physical Exam Oblong darkened nevus to the post R shoulder area Borders are symm and color is uniform Size.3x.5cm No ulceration noted Area cleansed with betadine Anesth with 1 cc lido w/ epi under sterile techniq Shave done today Hyfrecated the base Dressing placed    Assessment:     1. Changing mole        Plan:     Wound care reviewed Will inform of lab results F/U pending results

## 2013-01-23 NOTE — Patient Instructions (Signed)
Wound Care Wound care helps prevent pain and infection.  You may need a tetanus shot if:  You cannot remember when you had your last tetanus shot.  You have never had a tetanus shot.  The injury broke your skin. If you need a tetanus shot and you choose not to have one, you may get tetanus. Sickness from tetanus can be serious. HOME CARE   Only take medicine as told by your doctor.  Clean the wound daily with mild soap and water.  Change any bandages (dressings) as told by your doctor.  Put medicated cream and a bandage on the wound as told by your doctor.  Change the bandage if it gets wet, dirty, or starts to smell.  Take showers. Do not take baths, swim, or do anything that puts your wound under water.  Rest and raise (elevate) the wound until the pain and puffiness (swelling) are better.  Keep all doctor visits as told. GET HELP RIGHT AWAY IF:   Yellowish-white fluid (pus) comes from the wound.  Medicine does not lessen your pain.  There is a red streak going away from the wound.  You have a fever. MAKE SURE YOU:   Understand these instructions.  Will watch your condition.  Will get help right away if you are not doing well or get worse. Document Released: 05/24/2008 Document Revised: 11/07/2011 Document Reviewed: 12/19/2010 [facility] Patient Information 2014 [place], [place].

## 2013-01-25 NOTE — Telephone Encounter (Signed)
Discussed at patient's office visit and new medication has been prescribed.

## 2013-02-07 ENCOUNTER — Other Ambulatory Visit: Payer: Self-pay | Admitting: *Deleted

## 2013-02-07 MED ORDER — FUROSEMIDE 20 MG PO TABS: 30.0000 mg | ORAL_TABLET | Freq: Every day | ORAL | Status: DC

## 2013-02-13 ENCOUNTER — Other Ambulatory Visit (INDEPENDENT_AMBULATORY_CARE_PROVIDER_SITE_OTHER): Payer: BC Managed Care – PPO

## 2013-02-13 DIAGNOSIS — E291 Testicular hypofunction: Secondary | ICD-10-CM

## 2013-02-13 LAB — TESTOSTERONE, TOTAL AND FREE DIRECT MEASURE: Testosterone: 511 ng/dL (ref 300–890)

## 2013-02-13 NOTE — Progress Notes (Signed)
Patient came in to re-check testosterone

## 2013-02-18 ENCOUNTER — Ambulatory Visit (INDEPENDENT_AMBULATORY_CARE_PROVIDER_SITE_OTHER): Payer: BC Managed Care – PPO | Admitting: Pharmacist

## 2013-02-18 VITALS — BP 116/68 | HR 70 | Wt 267.0 lb

## 2013-02-18 DIAGNOSIS — R7989 Other specified abnormal findings of blood chemistry: Secondary | ICD-10-CM

## 2013-02-18 DIAGNOSIS — E291 Testicular hypofunction: Secondary | ICD-10-CM

## 2013-02-18 NOTE — Progress Notes (Signed)
Tony Cantu 03/29/1951  CC:  Patient was referred by PCP Tony Cantu to re-evaluate low testosterone after change from Androgel to [place].   HPI:  Tony Cantu diagnoses with low testosterone around 2011 and hs use Androgel 1% and then Androgel 1.62%4 pumps daily to treat.  Until 12/2011 when total and free testosterone were found to be low despite adequate therapy.  Patient then stopped Androgel and started Axiron  1 pump to each under arm daily. Patient denies edema, SOB Denies any changes in energy level or libido  Lab results from 01/07/13: Testosterone  290 (L)    Free Testosterone, Direct  3.0 (L)   3.8 - 34.2 pg/mL  HGB = 16.2 HCT=48.2  Assessment: Low testosterone - tolerating Axiron well but labs that were drawn 6/18 are still pending.  Plan: Our lab is checking into delay of lab result. Continue Axiron 1 pump to each under arm daily.  Continue to monitor CBC and PSA at least yearly.

## 2013-02-19 ENCOUNTER — Telehealth: Payer: Self-pay | Admitting: Family Medicine

## 2013-02-19 NOTE — Telephone Encounter (Signed)
Make sure that this has been taken care of, I noticed that only a total testosterone l was returned and not a free indirect testosterone

## 2013-02-19 NOTE — Telephone Encounter (Signed)
Viewed last labwork and per lab they made a mistake on blood. They are to contact patient and they will go to pts house and redraw blood.

## 2013-02-19 NOTE — Progress Notes (Signed)
LMOM

## 2013-02-20 ENCOUNTER — Telehealth: Payer: Self-pay | Admitting: *Deleted

## 2013-02-20 MED ORDER — TESTOSTERONE 30 MG/ACT TD SOLN
1.0000 | Freq: Every day | TRANSDERMAL | Status: DC
Start: 2013-02-20 — End: 2014-01-13

## 2013-02-20 NOTE — Telephone Encounter (Signed)
Message copied by Marin Olp on Wed Feb 20, 2013 10:32 AM ------      Message from: Chipper Herb      Created: Tue Feb 19, 2013  1:28 PM       The total testosterone was within normal limits      I do not see the free direct testosterone please check with the lab for this result ------

## 2013-02-20 NOTE — Telephone Encounter (Signed)
Pt notified of labs RX for axiron printed and ready for pt pickup

## 2013-02-21 ENCOUNTER — Other Ambulatory Visit: Payer: Self-pay | Admitting: Family Medicine

## 2013-02-22 LAB — TESTOSTERONE, TOTAL AND FREE DIRECT MEASURE: Testosterone: 598 ng/dL (ref 300–890)

## 2013-02-22 NOTE — Telephone Encounter (Signed)
Pt notified of results

## 2013-03-10 ENCOUNTER — Other Ambulatory Visit: Payer: Self-pay | Admitting: Pulmonary Disease

## 2013-03-10 DIAGNOSIS — G4733 Obstructive sleep apnea (adult) (pediatric): Secondary | ICD-10-CM

## 2013-03-21 ENCOUNTER — Encounter: Payer: Self-pay | Admitting: *Deleted

## 2013-03-27 ENCOUNTER — Encounter: Payer: Self-pay | Admitting: Cardiology

## 2013-03-27 ENCOUNTER — Ambulatory Visit (INDEPENDENT_AMBULATORY_CARE_PROVIDER_SITE_OTHER): Payer: BC Managed Care – PPO | Admitting: Cardiology

## 2013-03-27 VITALS — BP 130/68 | HR 68 | Ht 70.0 in | Wt 268.0 lb

## 2013-03-27 DIAGNOSIS — E785 Hyperlipidemia, unspecified: Secondary | ICD-10-CM

## 2013-03-27 DIAGNOSIS — I10 Essential (primary) hypertension: Secondary | ICD-10-CM

## 2013-03-27 NOTE — Progress Notes (Signed)
Patient ID: Tony Cantu, male   DOB: [birth year], 62 y.o.   MRN: [id number] PCP: Dr. Laurance Flatten  62 yo with history of HTN, DM, obesity presents for cardiology followup.   He had a cardiolite in 2009 that was a normal study.  He has been doing well in general.  He golfs occasionally at [place].  No chest pain or dyspnea with exertion.  BP and lipids are under good control.  He continues to have trouble losing weight.  He is wearing CPAP which seems to help (less tired during the day).  Weight is down 1 lb compared to last year.    ECG: NSR, RBBB (no change)  Labs (7/12): hgbA1c 5.8 Labs (6/13): K 4.7, creatinine 0.93, LDL particle number 859, LDL 54, HDL 57 Labs (5/14): K 4.9, creatinine 0.82, LDL 45, LDL particle number 725  PMH: 1. Obesity 2. HTN 3. Diabetes mellitus with history of hypoglycemic episodes.  4. Hyperlipidemia 5. Cardiolite (6/09): Normal study.  6. Lumbar spine surgery in 1994 7. Hernia repair in 2003 8. Plantar fasciitis 9. Shoulder replacement 10. OSA on CPAP 11. Echo (7/13): EF 55-60%, mild LVH, mild RV dilation, mildly decreased RV systolic function.   SH: Divorced in 2004 and remarried now.  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 Likes golf.   FH: Grandmother with MI in her 64s, mother with MI in her 15s, father died in his 47s (suicide).   Current Outpatient Prescriptions  Medication Sig Dispense Refill  . aspirin (ASPIRIN LOW DOSE) 81 MG EC tablet Take 81 mg by mouth daily.        Marland Kitchen atorvastatin (LIPITOR) 40 MG tablet Take 1 tablet (40 mg total) by mouth daily.  90 tablet  4  . b complex vitamins tablet Take 1 tablet by mouth daily.        . Calcium Carbonate (CALTRATE 600 PO) Take by mouth daily.        . Cholecalciferol (VITAMIN D3) 2000 UNITS capsule Take 2,000 Units by mouth daily.        . furosemide (LASIX) 20 MG tablet Take 1.5 tablets (30 mg total) by mouth daily.  135 tablet  4  . Glucosamine-Chondroit-Vit C-Mn  (GLUCOSAMINE 1500 COMPLEX) CAPS Take by mouth daily.        Marland Kitchen glucose blood (ONE TOUCH ULTRA TEST) test strip chesk BS q 2 hours PRN  800 each  4  . insulin glargine (LANTUS) 100 UNIT/ML injection Inject 0.75 mLs (75 Units total) into the skin 2 (two) times daily.  10 vial  4  . insulin lispro (HUMALOG) 100 UNIT/ML injection Inject 20-40 Units into the skin 3 (three) times daily before meals. Sliding scale      . Insulin Syringe-Needle U-100 (BD INSULIN SYRINGE ULTRAFINE) 31G X 5/16" 0.5 ML MISC Inject 8 Syringes into the skin every 2 (two) hours.  720 each  4  . Lancets (BD LANCET ULTRAFINE 30G) MISC Inject 1 Device into the skin every 2 (two) hours.  800 each  4  . loratadine (CLARITIN) 10 MG tablet Take 10 mg by mouth daily.      . montelukast (SINGULAIR) 10 MG tablet Take 1 tablet (10 mg total) by mouth at bedtime.  90 tablet  4  . niacin (NIASPAN) 500 MG CR tablet Take 1 tablet (500 mg total) by mouth daily.  90 tablet  4  . pantoprazole (PROTONIX) 40 MG tablet Take 1 tablet (40 mg total) by  mouth daily.  90 tablet  4  . ranitidine (ZANTAC) 75 MG tablet Take 75 mg by mouth at bedtime.      . Testosterone (AXIRON) 30 MG/ACT SOLN Place 1 application onto the skin daily. Place 1 application under each axilla daily  270 mL  3  . traMADol (ULTRAM) 50 MG tablet Take 1 tablet (50 mg total) by mouth every 6 (six) hours as needed.  90 tablet  2   No current facility-administered medications for this visit.    BP 130/68  Pulse 68  Ht 5' 10"  (1.778 m)  Wt 121.564 kg (268 lb)  BMI 38.45 kg/m2 General: NAD, obese.  Neck: Thick, no JVD, no thyromegaly or thyroid nodule.  Lungs: Clear to auscultation bilaterally with normal respiratory effort. CV: Nondisplaced PMI.  Heart regular S1/S2, no S3/S4, no murmur.  1+ edema 1/3 to knees bilaterally with some lower leg varicosities.  No carotid bruit.  Normal pedal pulses.  Abdomen: Soft, nontender, no hepatosplenomegaly, no distention.  Neurologic: Alert  and oriented x 3.  Psych: Normal affect. Extremities: No clubbing or cyanosis.   Assessment/Plan: 1. HTN: BP appears to be well-controlled on current regimen.  2. Hyperlipidemia: Very good LDL and LDL particle number.  Continue atorvastatin and Niacin.  3. Obesity: Weight loss is imperative.  We discussed dietary and exercise strategies.  4. RV dilation/dysfunction on echo: Mild.  This could be related to OSA with a degree of pulmonary HTN.    Followup in 1 year.   [name] 03/27/2013

## 2013-03-27 NOTE — Patient Instructions (Signed)
Your physician wants you to follow-up in: 1 year with Dr Aundra Dubin. (July 2015). You will receive a reminder letter in the mail two months in advance. If you don't receive a letter, please call our office to schedule the follow-up appointment.

## 2013-04-11 ENCOUNTER — Ambulatory Visit (INDEPENDENT_AMBULATORY_CARE_PROVIDER_SITE_OTHER): Payer: BC Managed Care – PPO | Admitting: Nurse Practitioner

## 2013-04-11 VITALS — BP 111/61 | HR 66 | Temp 98.4°F | Ht 70.0 in | Wt 270.0 lb

## 2013-04-11 DIAGNOSIS — L03116 Cellulitis of left lower limb: Secondary | ICD-10-CM

## 2013-04-11 DIAGNOSIS — L02419 Cutaneous abscess of limb, unspecified: Secondary | ICD-10-CM

## 2013-04-11 DIAGNOSIS — I8310 Varicose veins of unspecified lower extremity with inflammation: Secondary | ICD-10-CM

## 2013-04-11 DIAGNOSIS — I8312 Varicose veins of left lower extremity with inflammation: Secondary | ICD-10-CM

## 2013-04-11 MED ORDER — CIPROFLOXACIN HCL 500 MG PO TABS: 500.0000 mg | ORAL_TABLET | Freq: Two times a day (BID) | ORAL | Status: DC

## 2013-04-11 NOTE — Progress Notes (Signed)
  Subjective:    Patient ID: Tony Cantu, male    DOB: 1950/10/26, 62 y.o.   MRN: [id number]  HPI patient said that last Thursday he had slight ankle pain- got up next morning and left shin was hurting- He did nothing about it- Then today he went to cross right leg over left in bed and that hurt left shin- His wife came home and look at his leg and said it was red and warm to touch and he needed it looked at. Denies calf pain or swelling.    Review of Systems  All other systems reviewed and are negative.       Objective:   Physical Exam  Constitutional: He appears well-developed and well-nourished.  Cardiovascular: Normal rate and normal heart sounds.   Pulmonary/Chest: Effort normal and breath sounds normal.  Musculoskeletal: He exhibits edema (2= left lower leg).  Skin:  Left lower leg erythema and tender to touch- mild warmness   BP 111/61  Pulse 66  Temp(Src) 98.4 F (36.9 C) (Oral)  Ht 5' 10"  (1.778 m)  Wt 270 lb (122.471 kg)  BMI 38.74 kg/m2        Assessment & Plan:   1. Cellulitis of left lower leg   2. Venous stasis dermatitis, left    Meds ordered this encounter  Medications  . telmisartan (MICARDIS) 80 MG tablet    Sig: Take 40 mg by mouth daily.  . ciprofloxacin (CIPRO) 500 MG tablet    Sig: Take 1 tablet (500 mg total) by mouth 2 (two) times daily.    Dispense:  20 tablet    Refill:  0    Order Specific Question:  Supervising Provider    Answer:  Chipper Herb [[id number]]   Increase lasix to 40 mg a day for 3-4 days Elevate legs when sitting RTO if no improvement  Mary-Margaret Hassell Done, FNP

## 2013-04-11 NOTE — Addendum Note (Signed)
Addended by: Chevis Pretty on: 04/11/2013 05:24 PM   Modules accepted: Orders

## 2013-04-11 NOTE — Patient Instructions (Signed)
Cellulitis Cellulitis is an infection of the skin and the tissue beneath it. The infected area is usually red and tender. Cellulitis occurs most often in the arms and lower legs.  CAUSES  Cellulitis is caused by bacteria that enter the skin through cracks or cuts in the skin. The most common types of bacteria that cause cellulitis are Staphylococcus and Streptococcus. SYMPTOMS   Redness and warmth.  Swelling.  Tenderness or pain.  Fever. DIAGNOSIS  Your caregiver can usually determine what is wrong based on a physical exam. Blood tests may also be done. TREATMENT  Treatment usually involves taking an antibiotic medicine. HOME CARE INSTRUCTIONS   Take your antibiotics as directed. Finish them even if you start to feel better.  Keep the infected arm or leg elevated to reduce swelling.  Apply a warm cloth to the affected area up to 4 times per day to relieve pain.  Only take over-the-counter or prescription medicines for pain, discomfort, or fever as directed by your caregiver.  Keep all follow-up appointments as directed by your caregiver. SEEK MEDICAL CARE IF:   You notice red streaks coming from the infected area.  Your red area gets larger or turns dark in color.  Your bone or joint underneath the infected area becomes painful after the skin has healed.  Your infection returns in the same area or another area.  You notice a swollen bump in the infected area.  You develop new symptoms. SEEK IMMEDIATE MEDICAL CARE IF:   You have a fever.  You feel very sleepy.  You develop vomiting or diarrhea.  You have a general ill feeling (malaise) with muscle aches and pains. MAKE SURE YOU:   Understand these instructions.  Will watch your condition.  Will get help right away if you are not doing well or get worse. Document Released: 05/25/2005 Document Revised: 02/14/2012 Document Reviewed: 10/31/2011 [employer] Patient Information 2014 [employer].  

## 2013-04-15 ENCOUNTER — Encounter: Payer: Self-pay | Admitting: Family Medicine

## 2013-04-15 ENCOUNTER — Ambulatory Visit (INDEPENDENT_AMBULATORY_CARE_PROVIDER_SITE_OTHER): Payer: BC Managed Care – PPO

## 2013-04-15 ENCOUNTER — Ambulatory Visit (INDEPENDENT_AMBULATORY_CARE_PROVIDER_SITE_OTHER): Payer: BC Managed Care – PPO | Admitting: Family Medicine

## 2013-04-15 VITALS — BP 114/62 | HR 84 | Temp 97.7°F | Wt 266.0 lb

## 2013-04-15 DIAGNOSIS — R609 Edema, unspecified: Secondary | ICD-10-CM

## 2013-04-15 DIAGNOSIS — M25572 Pain in left ankle and joints of left foot: Secondary | ICD-10-CM

## 2013-04-15 DIAGNOSIS — M25579 Pain in unspecified ankle and joints of unspecified foot: Secondary | ICD-10-CM

## 2013-04-15 NOTE — Patient Instructions (Addendum)
Finish antibiotic Take Lasix 40 mg qd Return for recheck in 1 week Call with results of labs and x-ray when available Limit walking long distances and wear good support shoes Watch sodium intake Elevate foot when sitting

## 2013-04-15 NOTE — Progress Notes (Signed)
  Subjective:    Patient ID: Tony Cantu, male    DOB: 1951/02/14, 62 y.o.   MRN: [id number]  HPI Patient comes in today because of desire to get an x-ray of the left ankle. He has been treated since 08- 14 -2014 with Cipro for cellulitis. He has also been taking an extra Lasix which would amount to 40 mg daily. There is still minimal pain except with applying pressure over the left medial malleolus. There is still some edema.   Review of Systems  Musculoskeletal: Positive for arthralgias (L medial malleolus, swollen x 1 wk).       Objective:   Physical Exam  Nursing note and vitals reviewed. Constitutional: He is oriented to person, place, and time. He appears well-developed and well-nourished. No distress.  Musculoskeletal: Normal range of motion. He exhibits edema and tenderness (there is tenderness over the left medial malleolar).  There is 1+ pretibial edema bilaterally slightly greater on the left. There is no warmth or erythema today.  Neurological: He is alert and oriented to person, place, and time.  Skin: Skin is warm and dry. No rash noted. No erythema.  Psychiatric: He has a normal mood and affect.   [facility] reading (PRIMARY) by  Dr. Laurance Flatten; large calcaneal spur, but the ankle within normal                                         Assessment & Plan:  1. Edema - CBC With differential/Platelet - BMP8+EGFR - DG Ankle Complete Left; Future - Uric acid  2. Left ankle pain  Patient Instructions  Finish antibiotic Take Lasix 40 mg qd Return for recheck in 1 week Call with results of labs and x-ray when available Limit walking long distances and wear good support shoes Watch sodium intake Elevate foot when sitting   Walk only on flat surfaces and not irregular ground   Arrie Senate MD

## 2013-04-16 LAB — BMP8+EGFR
BUN/Creatinine Ratio: 9 — ABNORMAL LOW (ref 10–22)
Creatinine, Ser: 1.07 mg/dL (ref 0.76–1.27)
GFR calc Af Amer: 85 mL/min/{1.73_m2} (ref >59)
GFR calc non Af Amer: 73 mL/min/{1.73_m2} (ref >59)
Potassium: 4.6 mmol/L (ref 3.5–5.2)
Sodium: 138 mmol/L (ref 134–144)

## 2013-04-16 LAB — CBC WITH DIFFERENTIAL
Basos: 0 % (ref 0–3)
Eos: 3 % (ref 0–5)
Eosinophils Absolute: 0.2 10*3/uL (ref 0.0–0.4)
Hemoglobin: 15.9 g/dL (ref 12.6–17.7)
Immature Grans (Abs): 0 10*3/uL (ref 0.0–0.1)
Lymphocytes Absolute: 1.6 10*3/uL (ref 0.7–3.1)
MCH: 32.1 pg (ref 26.6–33.0)
MCHC: 33.9 g/dL (ref 31.5–35.7)
MCV: 95 fL (ref 79–97)
Monocytes Absolute: 1.4 10*3/uL — ABNORMAL HIGH (ref 0.1–0.9)
Neutrophils Absolute: 6 10*3/uL (ref 1.4–7.0)
Neutrophils Relative %: 64 % (ref 40–74)

## 2013-04-17 ENCOUNTER — Telehealth: Payer: Self-pay | Admitting: *Deleted

## 2013-04-17 NOTE — Telephone Encounter (Signed)
Message copied by Marin Olp on Wed Apr 17, 2013 12:55 PM ------      Message from: Chipper Herb      Created: Tue Apr 16, 2013 10:27 PM       Normal CBC including hemoglobin WBC and platelets      Blood sugar is 111. The kidney function test or the creatinine is within normal limits. Sodium potassium and chloride are within normal limits.      The uric acid is within normal limits            How is ankle feeling???       ------

## 2013-04-17 NOTE — Telephone Encounter (Signed)
Pt notified of results Ankle is tender today in the joint but better than at office visit

## 2013-04-22 ENCOUNTER — Encounter: Payer: Self-pay | Admitting: Family Medicine

## 2013-04-22 ENCOUNTER — Ambulatory Visit (INDEPENDENT_AMBULATORY_CARE_PROVIDER_SITE_OTHER): Payer: BC Managed Care – PPO | Admitting: Family Medicine

## 2013-04-22 VITALS — BP 111/66 | HR 74 | Temp 98.1°F | Ht 70.0 in | Wt 269.2 lb

## 2013-04-22 DIAGNOSIS — M25579 Pain in unspecified ankle and joints of unspecified foot: Secondary | ICD-10-CM

## 2013-04-22 DIAGNOSIS — R609 Edema, unspecified: Secondary | ICD-10-CM

## 2013-04-22 DIAGNOSIS — M25572 Pain in left ankle and joints of left foot: Secondary | ICD-10-CM

## 2013-04-22 NOTE — Progress Notes (Signed)
  Subjective:    Patient ID: Tony Cantu, male    DOB: [birth year], 62 y.o.   MRN: [id number]  HPI The patient returns to clinic today for followup of ankle pain and swelling. The previous visit was one week ago. He had x-rays and a uric acid and these were normal except for a spur on the calcaneus.   Review of Systems Patient continues to have tenderness and pain with palpation    Objective:   Physical Exam  Patient is tender left medial ankle above the medial malleolus. There is some edema about 2+ on the left but there is also moderate edema on the right at 1+. There is no warmth to palpation, there is no bruising noted      Assessment & Plan:  Edema  Left ankle pain   Patient Instructions  Continue to elevate foot and ankle when supine Use warm wet compresses Increase Lasix to 40 mg if baseline weight rises 3-4 pounds from one day to the next Continue to watch sodium intake If ankle continues to bother patient, arrange bone scan in a couple weeks for further evaluation, this can be done at [facility] or [place] imaging Patient will call back for this   Arrie Senate MD

## 2013-04-22 NOTE — Patient Instructions (Signed)
Continue to elevate foot and ankle when supine Use warm wet compresses Increase Lasix to 40 mg if baseline weight rises 3-4 pounds from one day to the next Continue to watch sodium intake If ankle continues to bother patient, arrange bone scan in a couple weeks for further evaluation, this can be done at [facility] or [place] imaging Patient will call back for this

## 2013-05-07 ENCOUNTER — Other Ambulatory Visit: Payer: Self-pay | Admitting: *Deleted

## 2013-05-07 DIAGNOSIS — M25572 Pain in left ankle and joints of left foot: Secondary | ICD-10-CM

## 2013-05-15 ENCOUNTER — Encounter (HOSPITAL_COMMUNITY): Payer: Self-pay

## 2013-05-15 ENCOUNTER — Encounter (HOSPITAL_COMMUNITY)
Admission: RE | Admit: 2013-05-15 | Discharge: 2013-05-15 | Disposition: A | Discharge status: Home / Self Care | Payer: BC Managed Care – PPO | Source: Ambulatory Visit | Attending: Family Medicine | Admitting: Family Medicine

## 2013-05-15 DIAGNOSIS — M25579 Pain in unspecified ankle and joints of unspecified foot: Secondary | ICD-10-CM | POA: Insufficient documentation

## 2013-05-15 DIAGNOSIS — M25572 Pain in left ankle and joints of left foot: Secondary | ICD-10-CM

## 2013-05-15 MED ORDER — TECHNETIUM TC 99M MEDRONATE IV KIT
25.0000 | PACK | Freq: Once | INTRAVENOUS | Status: AC | PRN
  Administered 2013-05-15: 25 via INTRAVENOUS

## 2013-05-17 ENCOUNTER — Other Ambulatory Visit (INDEPENDENT_AMBULATORY_CARE_PROVIDER_SITE_OTHER): Payer: BC Managed Care – PPO

## 2013-05-17 DIAGNOSIS — I10 Essential (primary) hypertension: Secondary | ICD-10-CM

## 2013-05-17 DIAGNOSIS — E785 Hyperlipidemia, unspecified: Secondary | ICD-10-CM

## 2013-05-17 DIAGNOSIS — E1149 Type 2 diabetes mellitus with other diabetic neurological complication: Secondary | ICD-10-CM

## 2013-05-17 DIAGNOSIS — E291 Testicular hypofunction: Secondary | ICD-10-CM

## 2013-05-17 DIAGNOSIS — E559 Vitamin D deficiency, unspecified: Secondary | ICD-10-CM

## 2013-05-17 LAB — POCT CBC
Granulocyte percent: 68.1 %G (ref 37–80)
HCT, POC: 46.1 % (ref 43.5–53.7)
Hemoglobin: 15.4 g/dL (ref 14.1–18.1)
Lymph, poc: 1.7 (ref 0.6–3.4)
MCHC: 33.4 g/dL (ref 31.8–35.4)
MPV: 8.5 fL (ref 0–99.8)
POC Granulocyte: 4.4 (ref 2–6.9)
RDW, POC: 14.8 %

## 2013-05-17 NOTE — Progress Notes (Signed)
Pt came in for labs only 

## 2013-05-19 LAB — HEPATIC FUNCTION PANEL
Albumin: 4 g/dL (ref 3.6–4.8)
Bilirubin, Direct: 0.19 mg/dL (ref 0.00–0.40)
Total Bilirubin: 0.6 mg/dL (ref 0.0–1.2)

## 2013-05-19 LAB — BMP8+EGFR
BUN: 10 mg/dL (ref 8–27)
Calcium: 9.3 mg/dL (ref 8.6–10.2)
Chloride: 100 mmol/L (ref 97–108)
GFR calc non Af Amer: 85 mL/min/{1.73_m2} (ref >59)
Glucose: 54 mg/dL — ABNORMAL LOW (ref 65–99)
Potassium: 4.9 mmol/L (ref 3.5–5.2)

## 2013-05-19 LAB — VITAMIN D 25 HYDROXY (VIT D DEFICIENCY, FRACTURES): Vit D, 25-Hydroxy: 57.5 ng/mL (ref 30.0–100.0)

## 2013-05-19 LAB — TESTOSTERONE,FREE AND TOTAL: Testosterone: 855 ng/dL (ref 348–1197)

## 2013-05-19 LAB — NMR, LIPOPROFILE
LDL Particle Number: 580 nmol/L (ref <1000)
LDL Size: 20.2 nm — ABNORMAL LOW (ref >20.5)
LDLC SERPL CALC-MCNC: 42 mg/dL (ref <100)
LP-IR Score: 28 (ref <=45)

## 2013-05-20 ENCOUNTER — Encounter: Payer: Self-pay | Admitting: Family Medicine

## 2013-05-20 ENCOUNTER — Other Ambulatory Visit: Payer: Self-pay

## 2013-05-20 ENCOUNTER — Ambulatory Visit (INDEPENDENT_AMBULATORY_CARE_PROVIDER_SITE_OTHER): Payer: BC Managed Care – PPO | Admitting: Family Medicine

## 2013-05-20 VITALS — BP 116/60 | HR 71 | Temp 98.0°F | Ht 70.0 in | Wt 266.0 lb

## 2013-05-20 DIAGNOSIS — M79609 Pain in unspecified limb: Secondary | ICD-10-CM

## 2013-05-20 DIAGNOSIS — M79662 Pain in left lower leg: Secondary | ICD-10-CM

## 2013-05-20 NOTE — Patient Instructions (Addendum)
Appointment with vascular invasion specialist as soon as possible They will get the appropriate studies to evaluate the problem No more aggressive physical activity until seen by the vascular surgeon Use warm wet compresses and elevation

## 2013-05-20 NOTE — Progress Notes (Signed)
Subjective:    Patient ID: Tony Cantu, male    DOB: 07/16/1951, 62 y.o.   MRN: [id number]  HPIpt here for left lower extremity pain. This discomfort specifically developed 6 days ago after playing golf. He has had problems with this ankle off and on for over 6 weeks. X-ray studies bone scan have all been negative.    .Patient Active Problem List   Diagnosis Date Noted  . 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 Diabetes type 2, controlled 01/10/2013  . Essential hypertension, benign 01/10/2013  . OSA (obstructive sleep apnea) 05/16/2012  . Edema 02/21/2012  . At risk for coronary artery disease 03/20/2011  . Obesity 03/20/2011  . WEIGHT GAIN, ABNORMAL 11/09/2010  . HYPERLIPIDEMIA 06/22/2010  . ALLERGIC RHINITIS 06/22/2010  . ASTHMA 06/22/2010  . COUGH 06/22/2010   Outpatient Encounter Prescriptions as of 05/20/2013  Medication Sig Dispense Refill  . aspirin (ASPIRIN LOW DOSE) 81 MG EC tablet Take 81 mg by mouth daily.        Marland Kitchen atorvastatin (LIPITOR) 40 MG tablet Take 1 tablet (40 mg total) by mouth daily.  90 tablet  4  . b complex vitamins tablet Take 1 tablet by mouth daily.        . Calcium Carbonate (CALTRATE 600 PO) Take by mouth daily.        . Cholecalciferol (VITAMIN D3) 2000 UNITS capsule Take 2,000 Units by mouth daily.        . ciprofloxacin (CIPRO) 500 MG tablet Take 1 tablet (500 mg total) by mouth 2 (two) times daily.  20 tablet  0  . furosemide (LASIX) 40 MG tablet Take 40 mg by mouth.      . Glucosamine-Chondroit-Vit C-Mn (GLUCOSAMINE 1500 COMPLEX) CAPS Take by mouth daily.        Marland Kitchen glucose blood (ONE TOUCH ULTRA TEST) test strip chesk BS q 2 hours PRN  800 each  4  . insulin glargine (LANTUS) 100 UNIT/ML injection Inject 0.75 mLs (75 Units total) into the skin 2 (two) times daily.  10 vial  4  . Insulin Syringe-Needle U-100 (BD INSULIN SYRINGE ULTRAFINE) 31G X 5/16" 0.5 ML MISC Inject 8 Syringes into the skin every 2 (two) hours.  720 each  4  . Lancets (BD  LANCET ULTRAFINE 30G) MISC Inject 1 Device into the skin every 2 (two) hours.  800 each  4  . loratadine (CLARITIN) 10 MG tablet Take 10 mg by mouth daily.      . montelukast (SINGULAIR) 10 MG tablet Take 1 tablet (10 mg total) by mouth at bedtime.  90 tablet  4  . niacin (NIASPAN) 500 MG CR tablet Take 1 tablet (500 mg total) by mouth daily.  90 tablet  4  . pantoprazole (PROTONIX) 40 MG tablet Take 1 tablet (40 mg total) by mouth daily.  90 tablet  4  . ranitidine (ZANTAC) 75 MG tablet Take 75 mg by mouth at bedtime.      Marland Kitchen telmisartan (MICARDIS) 80 MG tablet Take 40 mg by mouth daily.      . Testosterone (AXIRON) 30 MG/ACT SOLN Place 1 application onto the skin daily. Place 1 application under each axilla daily  270 mL  3  . traMADol (ULTRAM) 50 MG tablet Take 1 tablet (50 mg total) by mouth every 6 (six) hours as needed.  90 tablet  2  . insulin lispro (HUMALOG) 100 UNIT/ML injection Inject 20-40 Units into the skin 3 (three) times daily before meals. Sliding  scale       No facility-administered encounter medications on file as of 05/20/2013.      Review of Systems  Constitutional: Negative.   HENT: Negative.   Eyes: Negative.   Respiratory: Negative.   Cardiovascular: Positive for leg swelling (left lower leg).  Gastrointestinal: Negative.   Endocrine: Negative.   Genitourinary: Negative.   Musculoskeletal: Negative.        Left lower leg pain, redness and swelling  Skin: Positive for color change (reddness to left lower leg).  Allergic/Immunologic: Negative.   Neurological: Negative.   Hematological: Negative.   Psychiatric/Behavioral: Negative.        Objective:   Physical Exam  Nursing note and vitals reviewed. Constitutional: He is oriented to person, place, and time. He appears well-developed and well-nourished. No distress.  Musculoskeletal: Normal range of motion. He exhibits edema (slight edema of left lower leg).  Patient has mild induration and definite  tenderness above the left medial malleolus in the area of the lower calf. This is different from past visits.  Neurological: He is alert and oriented to person, place, and time. He has normal reflexes.  Skin: Skin is warm and dry. No rash noted. There is erythema.  Psychiatric: He has a normal mood and affect. His behavior is normal. Judgment and thought content normal.     BP 116/60  Pulse 71  Temp(Src) 98 F (36.7 C) (Oral)  Ht 5' 10"  (1.778 m)  Wt 266 lb (120.657 kg)  BMI 38.17 kg/m2      Assessment & Plan:   1. Calf pain, left    Orders Placed This Encounter  Procedures  . Ambulatory referral to Vascular Surgery    Referral Priority:  Routine    Referral Type:  Surgical    Referral Reason:  Specialty Services Required    Requested Specialty:  Vascular Surgery    Number of Visits Requested:  1   Patient Instructions  Appointment with vascular invasion specialist as soon as possible They will get the appropriate studies to evaluate the problem No more aggressive physical activity until seen by the vascular surgeon Use warm wet compresses and elevation   Arrie Senate MD

## 2013-05-21 ENCOUNTER — Encounter (INDEPENDENT_AMBULATORY_CARE_PROVIDER_SITE_OTHER): Payer: BC Managed Care – PPO | Admitting: *Deleted

## 2013-05-21 DIAGNOSIS — M79609 Pain in unspecified limb: Secondary | ICD-10-CM

## 2013-05-22 ENCOUNTER — Ambulatory Visit (INDEPENDENT_AMBULATORY_CARE_PROVIDER_SITE_OTHER): Payer: BC Managed Care – PPO | Admitting: Family Medicine

## 2013-05-22 ENCOUNTER — Encounter: Payer: Self-pay | Admitting: Family Medicine

## 2013-05-22 VITALS — BP 103/57 | HR 69 | Temp 97.5°F | Ht 70.0 in | Wt 266.0 lb

## 2013-05-22 DIAGNOSIS — E785 Hyperlipidemia, unspecified: Secondary | ICD-10-CM

## 2013-05-22 DIAGNOSIS — Q5310 Unspecified undescended testicle, unilateral: Secondary | ICD-10-CM

## 2013-05-22 DIAGNOSIS — R7989 Other specified abnormal findings of blood chemistry: Secondary | ICD-10-CM

## 2013-05-22 DIAGNOSIS — E291 Testicular hypofunction: Secondary | ICD-10-CM

## 2013-05-22 DIAGNOSIS — E119 Type 2 diabetes mellitus without complications: Secondary | ICD-10-CM

## 2013-05-22 DIAGNOSIS — Q539 Undescended testicle, unspecified: Secondary | ICD-10-CM

## 2013-05-22 DIAGNOSIS — I10 Essential (primary) hypertension: Secondary | ICD-10-CM

## 2013-05-22 DIAGNOSIS — N400 Enlarged prostate without lower urinary tract symptoms: Secondary | ICD-10-CM

## 2013-05-22 NOTE — Patient Instructions (Addendum)
TAKE 2 (TWO) 325 MG ASPRIN THREE TIMES DAILY.   Phlebitis Phlebitis is a redness, tenderness and soreness (inflammation) in a vein. This can occur in your arms, legs, or torso (trunk), as well as deeper inside your body.  CAUSES  Phlebitis can be triggered by multiple factors. These include:  Reduced (restricted) blood flow through your veins. This happens with prolonged bed rest, long distance travel, injury or surgery. Being overweight (obese) and pregnant can also restrict blood flow and lead to phlebitis.  Putting a catheter in the vein (intravenous or IV) and giving certain medications through in the vein (intravenously).  Cancer and cancer treatment.  Use of illegal intravenous drugs.  Inflammatory diseases.  Inherited (genetic) diseases that increase the risk for blood clots.  Hormone therapy (such as birth control pills). SYMPTOMS   Red, tender, swollen, painful area on your skin.  Usually, the area will be long and narrow.  Low grade fever.  Significant firmness along the center of this area. This can indicate that a blood clot has formed.  Surrounding redness or a high fever, which can indicate an infection (cellulitis). DIAGNOSIS   The appearance of your condition and your symptoms will cause your caregiver to suspect phlebitis. Usually, this is enough for a diagnosis.  Your caregiver may request blood tests or an ultrasound test of the area to be sure you do not have an infection or a blood clot. Blood tests and discussing your family history may also indicate if you have an underlying genetic disease that causes blood clots.  Occasionally, a piece of tissue is taken from the body (biopsy) if an unusual cause of phlebitis is suspected. TREATMENT   Raise (elevate) the affected area above the level of the heart.  Apply a warm compress or heating pad for 20 minutes, 3 or 4 times a day. If you use an electric heating pad, follow the directions so you do not burn  yourself.  Anti-inflammatory medications are usually recommended. Follow your caregiver's directions.  Any IV catheter, if present, will be removed by your caregiver.  Your caregiver may prescribe medicines that kill germs (antibiotics) if an infection is present.  Your caregiver may recommend blood thinners if a blood clot is suspected or present.  Support stockings or bandages may be helpful, depending on the cause and location of the phlebitis.  Surgery may be needed to remove very damaged sections of vein, but this is rare. HOME CARE INSTRUCTIONS   Take medications exactly as prescribed.  Follow up with your caregiver as directed.  Use support stockings or bandages if advised. These will speed healing and prevent recurrence.  If you are on blood thinners:  Do follow-up blood tests exactly as directed.  Check with your caregiver before using any new medications.  Wear a pendant to show that you are on blood thinners.  For phlebitis in the legs:  Avoid prolonged standing or bed rest.  Keep your legs moving. Raise your legs with sitting or lying.  Do not smoke.  Women, particularly those over the age of 10, should consider the risks and benefits of taking the contraceptive pill. This kind of hormone treatment can increase your risk for blood clots. SEEK MEDICAL CARE IF:   You have unusual bruising or any bleeding problems.  Swelling or pain in your affected arm or leg is not gradually improving.  You are on anti-inflammatory medication and you develop belly (abdominal) pain. SEEK IMMEDIATE MEDICAL CARE IF:   An unexplained oral  temperature above 100.5 F (38.1 C) develops.  You have sudden onset of chest pain or difficulty breathing. Document Released: 08/09/2001 Document Revised: 11/07/2011 Document Reviewed: 05/11/2009 [facility] Patient Information 2014 [place].   Take Ascriptin 2   3 times daily after meals Continue to take Protonix before breakfast  and take ranitidine 75 before lunch and dinner Use warm compresses 20 minutes 4 times daily to area of superficial thrombophlebitis Continue to monitor blood sugars closely Continue aggressive therapeutic lifestyle changes Continue to take current medication Don't forget to get your flu shot Don't put yourrself at risk of falling PSA test today

## 2013-05-22 NOTE — Progress Notes (Signed)
Subjective:    Patient ID: Tony Cantu, male    DOB: 03/08/1951, 62 y.o.   MRN: [id number]  HPI Pt here for follow up of chronic medical problems. Patient comes in today for management of his medical conditions and a followup from a recent venous Doppler indicating that he had superficial thrombophlebitis of the left ankle. He was started on Ascriptin to 3 times daily when this was found out. His most recent labs will be reviewed with him today. He will get a copy of these on leaving the office.   Patient Active Problem List   Diagnosis Date Noted  . 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 Diabetes type 2, controlled 01/10/2013  . Essential hypertension, benign 01/10/2013  . OSA (obstructive sleep apnea) 05/16/2012  . Edema 02/21/2012  . At risk for coronary artery disease 03/20/2011  . Obesity 03/20/2011  . WEIGHT GAIN, ABNORMAL 11/09/2010  . HYPERLIPIDEMIA 06/22/2010  . ALLERGIC RHINITIS 06/22/2010  . ASTHMA 06/22/2010  . COUGH 06/22/2010   Outpatient Encounter Prescriptions as of 05/22/2013  Medication Sig Dispense Refill  . aspirin (ASPIRIN LOW DOSE) 81 MG EC tablet Take 81 mg by mouth daily.        Marland Kitchen atorvastatin (LIPITOR) 40 MG tablet Take 1 tablet (40 mg total) by mouth daily.  90 tablet  4  . b complex vitamins tablet Take 1 tablet by mouth daily.        . Calcium Carbonate (CALTRATE 600 PO) Take by mouth daily.        . Cholecalciferol (VITAMIN D3) 2000 UNITS capsule Take 2,000 Units by mouth daily.        . ciprofloxacin (CIPRO) 500 MG tablet Take 1 tablet (500 mg total) by mouth 2 (two) times daily.  20 tablet  0  . furosemide (LASIX) 40 MG tablet Take 40 mg by mouth.      . Glucosamine-Chondroit-Vit C-Mn (GLUCOSAMINE 1500 COMPLEX) CAPS Take by mouth daily.        Marland Kitchen glucose blood (ONE TOUCH ULTRA TEST) test strip chesk BS q 2 hours PRN  800 each  4  . insulin glargine (LANTUS) 100 UNIT/ML injection Inject 0.75 mLs (75 Units total) into the skin 2 (two) times daily.  10  vial  4  . insulin lispro (HUMALOG) 100 UNIT/ML injection Inject 20-40 Units into the skin 3 (three) times daily before meals. Sliding scale      . Insulin Syringe-Needle U-100 (BD INSULIN SYRINGE ULTRAFINE) 31G X 5/16" 0.5 ML MISC Inject 8 Syringes into the skin every 2 (two) hours.  720 each  4  . Lancets (BD LANCET ULTRAFINE 30G) MISC Inject 1 Device into the skin every 2 (two) hours.  800 each  4  . loratadine (CLARITIN) 10 MG tablet Take 10 mg by mouth daily.      . montelukast (SINGULAIR) 10 MG tablet Take 1 tablet (10 mg total) by mouth at bedtime.  90 tablet  4  . niacin (NIASPAN) 500 MG CR tablet Take 1 tablet (500 mg total) by mouth daily.  90 tablet  4  . pantoprazole (PROTONIX) 40 MG tablet Take 1 tablet (40 mg total) by mouth daily.  90 tablet  4  . ranitidine (ZANTAC) 75 MG tablet Take 75 mg by mouth at bedtime.      Marland Kitchen telmisartan (MICARDIS) 80 MG tablet Take 40 mg by mouth daily.      . Testosterone (AXIRON) 30 MG/ACT SOLN Place 1 application onto the skin daily.  Place 1 application under each axilla daily  270 mL  3  . traMADol (ULTRAM) 50 MG tablet Take 1 tablet (50 mg total) by mouth every 6 (six) hours as needed.  90 tablet  2   No facility-administered encounter medications on file as of 05/22/2013.       Review of Systems  Constitutional: Negative.   HENT: Negative.   Eyes: Negative.   Respiratory: Negative.   Cardiovascular: Negative.   Gastrointestinal: Negative.   Endocrine: Negative.   Genitourinary: Negative.   Musculoskeletal: Negative.        Left superficial phlebitis   Skin: Positive for color change (redness- left lower leg).  Allergic/Immunologic: Negative.   Neurological: Negative.   Hematological: Negative.   Psychiatric/Behavioral: Negative.        Objective:   Physical Exam  Nursing note and vitals reviewed. Constitutional: He is oriented to person, place, and time. He appears well-developed and well-nourished.  HENT:  Head: Normocephalic  and atraumatic.  Right Ear: External ear normal.  Left Ear: External ear normal.  Nose: Nose normal.  Mouth/Throat: Oropharynx is clear and moist. No oropharyngeal exudate.  Eyes: Conjunctivae and EOM are normal. Pupils are equal, round, and reactive to light. Right eye exhibits no discharge. Left eye exhibits no discharge. No scleral icterus.  Neck: Normal range of motion. Neck supple. No thyromegaly present.  Cardiovascular: Normal rate, regular rhythm, normal heart sounds and intact distal pulses.   Pulmonary/Chest: Effort normal and breath sounds normal. No respiratory distress. He has no wheezes. He has no rales.  Abdominal: Soft. Bowel sounds are normal. He exhibits no mass. There is no tenderness. There is no rebound and no guarding.  Genitourinary: Rectum normal and penis normal. No penile tenderness.  Enlarged smooth prostate gland without nodules. 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 He exhibits edema. He exhibits no tenderness.  Patient has slight induration and and tenderness posterior to the left medial malleolar area, this is the area of the superficial thrombophlebitis.  Lymphadenopathy:    He has no cervical adenopathy.  Neurological: He is alert and oriented to person, place, and time. He has normal reflexes.  Skin: Skin is warm and dry. No rash noted. No erythema. No pallor.  Area of superficial thrombophlebitis posterior left medial malleolus and lower leg  Psychiatric: He has a normal mood and affect. His behavior is normal. Judgment and thought content normal.   BP 103/57  Pulse 69  Temp(Src) 97.5 F (36.4 C) (Oral)  Ht 5' 10"  (1.778 m)  Wt 266 lb (120.657 kg)  BMI 38.17 kg/m2        Assessment & Plan:  1. HYPERLIPIDEMIA  2. Diabetes type 2, controlled  3. Essential hypertension, benign  4. Low serum testosterone level - PSA, total and free  5. BPH (benign prostatic  hyperplasia)  6. Congenital absence of left testicle  Orders Placed This Encounter  Procedures  . PSA, total and free    Patient Instructions  TAKE 2 (TWO) 325 MG ASPRIN THREE TIMES DAILY.   Phlebitis Phlebitis is a redness, tenderness and soreness (inflammation) in a vein. This can occur in your arms, legs, or torso (trunk), as well as deeper inside your body.  CAUSES  Phlebitis can be triggered by multiple factors. These include:  Reduced (restricted) blood flow through your veins. This happens with prolonged bed rest, long distance travel, injury or surgery. Being overweight (obese) and pregnant can also restrict  blood flow and lead to phlebitis.  Putting a catheter in the vein (intravenous or IV) and giving certain medications through in the vein (intravenously).  Cancer and cancer treatment.  Use of illegal intravenous drugs.  Inflammatory diseases.  Inherited (genetic) diseases that increase the risk for blood clots.  Hormone therapy (such as birth control pills). SYMPTOMS   Red, tender, swollen, painful area on your skin.  Usually, the area will be long and narrow.  Low grade fever.  Significant firmness along the center of this area. This can indicate that a blood clot has formed.  Surrounding redness or a high fever, which can indicate an infection (cellulitis). DIAGNOSIS   The appearance of your condition and your symptoms will cause your caregiver to suspect phlebitis. Usually, this is enough for a diagnosis.  Your caregiver may request blood tests or an ultrasound test of the area to be sure you do not have an infection or a blood clot. Blood tests and discussing your family history may also indicate if you have an underlying genetic disease that causes blood clots.  Occasionally, a piece of tissue is taken from the body (biopsy) if an unusual cause of phlebitis is suspected. TREATMENT   Raise (elevate) the affected area above the level of the  heart.  Apply a warm compress or heating pad for 20 minutes, 3 or 4 times a day. If you use an electric heating pad, follow the directions so you do not burn yourself.  Anti-inflammatory medications are usually recommended. Follow your caregiver's directions.  Any IV catheter, if present, will be removed by your caregiver.  Your caregiver may prescribe medicines that kill germs (antibiotics) if an infection is present.  Your caregiver may recommend blood thinners if a blood clot is suspected or present.  Support stockings or bandages may be helpful, depending on the cause and location of the phlebitis.  Surgery may be needed to remove very damaged sections of vein, but this is rare. HOME CARE INSTRUCTIONS   Take medications exactly as prescribed.  Follow up with your caregiver as directed.  Use support stockings or bandages if advised. These will speed healing and prevent recurrence.  If you are on blood thinners:  Do follow-up blood tests exactly as directed.  Check with your caregiver before using any new medications.  Wear a pendant to show that you are on blood thinners.  For phlebitis in the legs:  Avoid prolonged standing or bed rest.  Keep your legs moving. Raise your legs with sitting or lying.  Do not smoke.  Women, particularly those over the age of 1, should consider the risks and benefits of taking the contraceptive pill. This kind of hormone treatment can increase your risk for blood clots. SEEK MEDICAL CARE IF:   You have unusual bruising or any bleeding problems.  Swelling or pain in your affected arm or leg is not gradually improving.  You are on anti-inflammatory medication and you develop belly (abdominal) pain. SEEK IMMEDIATE MEDICAL CARE IF:   An unexplained oral temperature above 100.5 F (38.1 C) develops.  You have sudden onset of chest pain or difficulty breathing. Document Released: 08/09/2001 Document Revised: 11/07/2011 Document  Reviewed: 05/11/2009 [facility] Patient Information 2014 [place].   Take Ascriptin 2   3 times daily after meals Continue to take Protonix before breakfast and take ranitidine 75 before lunch and dinner Use warm compresses 20 minutes 4 times daily to area of superficial thrombophlebitis Continue to monitor blood sugars closely Continue  aggressive therapeutic lifestyle changes Continue to take current medication Don't forget to get your flu shot Don't put yourrself at risk of falling PSA test today   Recheck thrombophlebitis and 2 weeks  Arrie Senate MD

## 2013-06-05 ENCOUNTER — Encounter: Payer: Self-pay | Admitting: Family Medicine

## 2013-06-05 ENCOUNTER — Ambulatory Visit (INDEPENDENT_AMBULATORY_CARE_PROVIDER_SITE_OTHER): Payer: BC Managed Care – PPO | Admitting: Family Medicine

## 2013-06-05 VITALS — BP 149/79 | HR 95 | Temp 97.4°F | Ht 70.0 in | Wt 265.0 lb

## 2013-06-05 DIAGNOSIS — Z23 Encounter for immunization: Secondary | ICD-10-CM

## 2013-06-05 DIAGNOSIS — I8000 Phlebitis and thrombophlebitis of superficial vessels of unspecified lower extremity: Secondary | ICD-10-CM

## 2013-06-05 DIAGNOSIS — I8002 Phlebitis and thrombophlebitis of superficial vessels of left lower extremity: Secondary | ICD-10-CM

## 2013-06-05 NOTE — Addendum Note (Signed)
Addended by: Zannie Cove on: 06/05/2013 12:37 PM   Modules accepted: Orders

## 2013-06-05 NOTE — Progress Notes (Signed)
Subjective:    Patient ID: Tony Cantu, male    DOB: [birth year], 62 y.o.   MRN: [id number]  HPI Patient here today for follow up on superficial phlebitis- left medial lower leg-near ankle. Patient indicates that he is doing better. He also indicates that he has much less pain and sensitivity to the area involved.    Patient Active Problem List   Diagnosis Date Noted  . BPH (benign prostatic hyperplasia) 05/22/2013  . 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 Diabetes type 2, controlled 01/10/2013  . Essential hypertension, benign 01/10/2013  . OSA (obstructive sleep apnea) 05/16/2012  . Edema 02/21/2012  . At risk for coronary artery disease 03/20/2011  . Obesity 03/20/2011  . WEIGHT GAIN, ABNORMAL 11/09/2010  . HYPERLIPIDEMIA 06/22/2010  . ALLERGIC RHINITIS 06/22/2010  . ASTHMA 06/22/2010  . COUGH 06/22/2010   Outpatient Encounter Prescriptions as of 06/05/2013  Medication Sig Dispense Refill  . aspirin (ASPIRIN LOW DOSE) 81 MG EC tablet Take 81 mg by mouth daily.        Marland Kitchen atorvastatin (LIPITOR) 40 MG tablet Take 1 tablet (40 mg total) by mouth daily.  90 tablet  4  . b complex vitamins tablet Take 1 tablet by mouth daily.        . Calcium Carbonate (CALTRATE 600 PO) Take by mouth daily.        . Cholecalciferol (VITAMIN D3) 2000 UNITS capsule Take 2,000 Units by mouth daily.        . ciprofloxacin (CIPRO) 500 MG tablet Take 1 tablet (500 mg total) by mouth 2 (two) times daily.  20 tablet  0  . furosemide (LASIX) 40 MG tablet Take 40 mg by mouth.      . Glucosamine-Chondroit-Vit C-Mn (GLUCOSAMINE 1500 COMPLEX) CAPS Take by mouth daily.        Marland Kitchen glucose blood (ONE TOUCH ULTRA TEST) test strip chesk BS q 2 hours PRN  800 each  4  . insulin glargine (LANTUS) 100 UNIT/ML injection Inject 0.75 mLs (75 Units total) into the skin 2 (two) times daily.  10 vial  4  . insulin lispro (HUMALOG) 100 UNIT/ML injection Inject 20-40 Units into the skin 3 (three) times daily before meals.  Sliding scale      . Insulin Syringe-Needle U-100 (BD INSULIN SYRINGE ULTRAFINE) 31G X 5/16" 0.5 ML MISC Inject 8 Syringes into the skin every 2 (two) hours.  720 each  4  . Lancets (BD LANCET ULTRAFINE 30G) MISC Inject 1 Device into the skin every 2 (two) hours.  800 each  4  . loratadine (CLARITIN) 10 MG tablet Take 10 mg by mouth daily.      . montelukast (SINGULAIR) 10 MG tablet Take 1 tablet (10 mg total) by mouth at bedtime.  90 tablet  4  . niacin (NIASPAN) 500 MG CR tablet Take 1 tablet (500 mg total) by mouth daily.  90 tablet  4  . pantoprazole (PROTONIX) 40 MG tablet Take 1 tablet (40 mg total) by mouth daily.  90 tablet  4  . ranitidine (ZANTAC) 75 MG tablet Take 75 mg by mouth at bedtime.      Marland Kitchen telmisartan (MICARDIS) 80 MG tablet Take 40 mg by mouth daily.      . Testosterone (AXIRON) 30 MG/ACT SOLN Place 1 application onto the skin daily. Place 1 application under each axilla daily  270 mL  3  . traMADol (ULTRAM) 50 MG tablet Take 1 tablet (50 mg total) by mouth every  6 (six) hours as needed.  90 tablet  2   No facility-administered encounter medications on file as of 06/05/2013.    Review of Systems  Constitutional: Negative.   HENT: Negative.   Eyes: Negative.   Respiratory: Negative.   Cardiovascular: Negative.   Gastrointestinal: Negative.   Endocrine: Negative.   Genitourinary: Negative.   Musculoskeletal: Negative.   Skin: Negative.   Allergic/Immunologic: Negative.   Neurological: Negative.   Hematological: Negative.   Psychiatric/Behavioral: Negative.        Objective:   Physical Exam BP 149/79  Pulse 95  Temp(Src) 97.4 F (36.3 C) (Oral)  Ht 5' 10"  (1.778 m)  Wt 265 lb (120.203 kg)  BMI 38.02 kg/m2  Patient is alert and cooperative. He has been taking 6 coated aspirin daily after meals. On examination of the involved area of superficial phlebitis, there is no rubor or erythema or swelling. There is minimal tenderness.      Assessment & Plan:    1. Superficial phlebitis of left leg, improved    Patient Instructions  Continue warm compresses 20 minutes 3 or 4 times daily Continue a prescription or coated aspirin 2 after each meal Return to clinic in 4 weeks May start playing golf again Get flu shot and Prevnar today   Arrie Senate MD

## 2013-06-05 NOTE — Patient Instructions (Signed)
Continue warm compresses 20 minutes 3 or 4 times daily Continue a prescription or coated aspirin 2 after each meal Return to clinic in 4 weeks May start playing golf again Get flu shot and Prevnar today

## 2013-07-17 ENCOUNTER — Ambulatory Visit: Payer: BC Managed Care – PPO | Admitting: Family Medicine

## 2013-09-10 ENCOUNTER — Other Ambulatory Visit (INDEPENDENT_AMBULATORY_CARE_PROVIDER_SITE_OTHER): Payer: BC Managed Care – PPO

## 2013-09-10 DIAGNOSIS — E785 Hyperlipidemia, unspecified: Secondary | ICD-10-CM

## 2013-09-10 DIAGNOSIS — E291 Testicular hypofunction: Secondary | ICD-10-CM

## 2013-09-10 DIAGNOSIS — I10 Essential (primary) hypertension: Secondary | ICD-10-CM

## 2013-09-10 DIAGNOSIS — E559 Vitamin D deficiency, unspecified: Secondary | ICD-10-CM

## 2013-09-10 DIAGNOSIS — E119 Type 2 diabetes mellitus without complications: Secondary | ICD-10-CM

## 2013-09-10 LAB — POCT CBC
Granulocyte percent: 66.8 %G (ref 37–80)
HEMATOCRIT: 49.6 % (ref 43.5–53.7)
HEMOGLOBIN: 16.3 g/dL (ref 14.1–18.1)
Lymph, poc: 1.9 (ref 0.6–3.4)
MCH, POC: 31.4 pg — AB (ref 27–31.2)
MCHC: 32.9 g/dL (ref 31.8–35.4)
MCV: 95.5 fL (ref 80–97)
MPV: 8.6 fL (ref 0–99.8)
POC GRANULOCYTE: 4.7 (ref 2–6.9)
POC LYMPH PERCENT: 27.3 %L (ref 10–50)
Platelet Count, POC: 138 10*3/uL — AB (ref 142–424)
RBC: 5.2 M/uL (ref 4.69–6.13)
RDW, POC: 15.8 %
WBC: 7.1 10*3/uL (ref 4.6–10.2)

## 2013-09-10 LAB — POCT GLYCOSYLATED HEMOGLOBIN (HGB A1C): Hemoglobin A1C: 5

## 2013-09-12 LAB — CMP14+EGFR
A/G RATIO: 1.5 (ref 1.1–2.5)
ALBUMIN: 4.2 g/dL (ref 3.6–4.8)
ALT: 35 IU/L (ref 0–44)
AST: 33 IU/L (ref 0–40)
Alkaline Phosphatase: 54 IU/L (ref 39–117)
BILIRUBIN TOTAL: 0.7 mg/dL (ref 0.0–1.2)
BUN/Creatinine Ratio: 12 (ref 10–22)
BUN: 13 mg/dL (ref 8–27)
CO2: 27 mmol/L (ref 18–29)
Calcium: 9.5 mg/dL (ref 8.6–10.2)
Chloride: 98 mmol/L (ref 97–108)
Creatinine, Ser: 1.05 mg/dL (ref 0.76–1.27)
GFR, EST AFRICAN AMERICAN: 87 mL/min/{1.73_m2} (ref >59)
GFR, EST NON AFRICAN AMERICAN: 75 mL/min/{1.73_m2} (ref >59)
GLUCOSE: 144 mg/dL — AB (ref 65–99)
Globulin, Total: 2.8 g/dL (ref 1.5–4.5)
POTASSIUM: 4.9 mmol/L (ref 3.5–5.2)
Sodium: 138 mmol/L (ref 134–144)
TOTAL PROTEIN: 7 g/dL (ref 6.0–8.5)

## 2013-09-12 LAB — NMR, LIPOPROFILE
CHOLESTEROL: 105 mg/dL (ref <200)
HDL Cholesterol by NMR: 49 mg/dL (ref >=40)
HDL PARTICLE NUMBER: 31 umol/L (ref >=30.5)
LDL Particle Number: 511 nmol/L (ref <1000)
LDL SIZE: 20.8 nm (ref >20.5)
LDLC SERPL CALC-MCNC: 45 mg/dL (ref <100)
LP-IR Score: 29 (ref <=45)
SMALL LDL PARTICLE NUMBER: 170 nmol/L (ref <=527)
TRIGLYCERIDES BY NMR: 57 mg/dL (ref <150)

## 2013-09-12 LAB — TESTOSTERONE,FREE AND TOTAL
TESTOSTERONE FREE: 9.8 pg/mL (ref 6.6–18.1)
Testosterone: 739 ng/dL (ref 348–1197)

## 2013-09-12 LAB — VITAMIN D 25 HYDROXY (VIT D DEFICIENCY, FRACTURES): Vit D, 25-Hydroxy: 49.3 ng/mL (ref 30.0–100.0)

## 2013-09-17 ENCOUNTER — Ambulatory Visit (INDEPENDENT_AMBULATORY_CARE_PROVIDER_SITE_OTHER): Payer: BC Managed Care – PPO

## 2013-09-17 ENCOUNTER — Encounter: Payer: Self-pay | Admitting: Family Medicine

## 2013-09-17 ENCOUNTER — Ambulatory Visit (INDEPENDENT_AMBULATORY_CARE_PROVIDER_SITE_OTHER): Payer: BC Managed Care – PPO | Admitting: Family Medicine

## 2013-09-17 VITALS — BP 120/66 | HR 75 | Temp 98.0°F | Ht 70.0 in | Wt 263.0 lb

## 2013-09-17 DIAGNOSIS — R7989 Other specified abnormal findings of blood chemistry: Secondary | ICD-10-CM

## 2013-09-17 DIAGNOSIS — E119 Type 2 diabetes mellitus without complications: Secondary | ICD-10-CM

## 2013-09-17 DIAGNOSIS — N400 Enlarged prostate without lower urinary tract symptoms: Secondary | ICD-10-CM

## 2013-09-17 DIAGNOSIS — E291 Testicular hypofunction: Secondary | ICD-10-CM

## 2013-09-17 DIAGNOSIS — E559 Vitamin D deficiency, unspecified: Secondary | ICD-10-CM | POA: Insufficient documentation

## 2013-09-17 DIAGNOSIS — I10 Essential (primary) hypertension: Secondary | ICD-10-CM

## 2013-09-17 DIAGNOSIS — E669 Obesity, unspecified: Secondary | ICD-10-CM

## 2013-09-17 DIAGNOSIS — B009 Herpesviral infection, unspecified: Secondary | ICD-10-CM

## 2013-09-17 DIAGNOSIS — E785 Hyperlipidemia, unspecified: Secondary | ICD-10-CM

## 2013-09-17 MED ORDER — VALACYCLOVIR HCL 1 G PO TABS: ORAL_TABLET | ORAL | Status: DC

## 2013-09-17 NOTE — Progress Notes (Signed)
Subjective:    Patient ID: Tony Cantu, male    DOB: 05/18/1951, 63 y.o.   MRN: [id number]  HPI Pt here for follow up and management of chronic medical problems. Patient describes some head congestion and has had cold sores recently. His lab work has been done and the only thing that was out of line was isolated blood sugar at 144. His hemoglobin A1c was excellent and all his cholesterol numbers by advanced lipid testing were excellent. The lab work was reviewed with the patient at the visit again today.       Patient Active Problem List   Diagnosis Date Noted  . BPH (benign prostatic hyperplasia) 05/22/2013  . 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 Diabetes type 2, controlled 01/10/2013  . Essential hypertension, benign 01/10/2013  . OSA (obstructive sleep apnea) 05/16/2012  . Edema 02/21/2012  . At risk for coronary artery disease 03/20/2011  . Obesity 03/20/2011  . WEIGHT GAIN, ABNORMAL 11/09/2010  . HYPERLIPIDEMIA 06/22/2010  . ALLERGIC RHINITIS 06/22/2010  . ASTHMA 06/22/2010  . COUGH 06/22/2010   Outpatient Encounter Prescriptions as of 09/17/2013  Medication Sig  . aspirin 325 MG EC tablet Take 325 mg by mouth daily.  Marland Kitchen atorvastatin (LIPITOR) 40 MG tablet Take 1 tablet (40 mg total) by mouth daily.  Marland Kitchen b complex vitamins tablet Take 1 tablet by mouth daily.    . Calcium Carbonate (CALTRATE 600 PO) Take by mouth daily.    . Cholecalciferol (VITAMIN D3) 2000 UNITS capsule Take 2,000 Units by mouth daily.    . furosemide (LASIX) 20 MG tablet Take 20 mg by mouth 2 (two) times daily. As directed  . Glucosamine-Chondroit-Vit C-Mn (GLUCOSAMINE 1500 COMPLEX) CAPS Take by mouth daily.    Marland Kitchen glucose blood (ONE TOUCH ULTRA TEST) test strip chesk BS q 2 hours PRN  . insulin glargine (LANTUS) 100 UNIT/ML injection Inject 75 Units into the skin 2 (two) times daily. As directed  . insulin lispro (HUMALOG) 100 UNIT/ML injection Inject 20-40 Units into the skin 3 (three) times daily  before meals. Sliding scale  . Insulin Syringe-Needle U-100 (BD INSULIN SYRINGE ULTRAFINE) 31G X 5/16" 0.5 ML MISC Inject 8 Syringes into the skin every 2 (two) hours.  . Lancets (BD LANCET ULTRAFINE 30G) MISC Inject 1 Device into the skin every 2 (two) hours.  Marland Kitchen loratadine (CLARITIN) 10 MG tablet Take 10 mg by mouth daily.  . montelukast (SINGULAIR) 10 MG tablet Take 1 tablet (10 mg total) by mouth at bedtime.  . niacin (NIASPAN) 500 MG CR tablet Take 1 tablet (500 mg total) by mouth daily.  . pantoprazole (PROTONIX) 40 MG tablet Take 1 tablet (40 mg total) by mouth daily.  . ranitidine (ZANTAC) 75 MG tablet Take 75 mg by mouth at bedtime.  . Testosterone (AXIRON) 30 MG/ACT SOLN Place 1 application onto the skin daily. Place 1 application under each axilla daily  . traMADol (ULTRAM) 50 MG tablet Take 1 tablet (50 mg total) by mouth every 6 (six) hours as needed.  . [DISCONTINUED] furosemide (LASIX) 40 MG tablet Take 40 mg by mouth.  . [DISCONTINUED] insulin glargine (LANTUS) 100 UNIT/ML injection Inject 0.75 mLs (75 Units total) into the skin 2 (two) times daily.  . [DISCONTINUED] aspirin (ASPIRIN LOW DOSE) 81 MG EC tablet Take 81 mg by mouth daily.    . [DISCONTINUED] ciprofloxacin (CIPRO) 500 MG tablet Take 1 tablet (500 mg total) by mouth 2 (two) times daily.  . [DISCONTINUED] telmisartan (MICARDIS)  80 MG tablet Take 40 mg by mouth daily.    Review of Systems  Constitutional: Negative.   HENT: Positive for congestion.        Cold sore  Eyes: Negative.   Respiratory: Negative.   Cardiovascular: Negative.   Gastrointestinal: Negative.   Endocrine: Negative.   Genitourinary: Negative.   Musculoskeletal: Negative.   Skin: Negative.   Allergic/Immunologic: Negative.   Neurological: Negative.   Hematological: Negative.   Psychiatric/Behavioral: Negative.        Objective:   Physical Exam  Nursing note and vitals reviewed. Constitutional: He is oriented to person, place, and time.  He appears well-developed and well-nourished. No distress.  HENT:  Head: Normocephalic and atraumatic.  Right Ear: External ear normal.  Left Ear: External ear normal.  Mouth/Throat: Oropharynx is clear and moist. No oropharyngeal exudate.  Nasal congestion bilaterally no purulent rhinorrhea  Eyes: Conjunctivae and EOM are normal. Pupils are equal, round, and reactive to light. Right eye exhibits no discharge. Left eye exhibits no discharge. No scleral icterus.  Neck: Normal range of motion. Neck supple. No thyromegaly present.  No carotid bruits  Cardiovascular: Normal rate, regular rhythm, normal heart sounds and intact distal pulses.  Exam reveals no gallop and no friction rub.   No murmur heard. Rhythm is regular at 72 per minute  Pulmonary/Chest: Effort normal and breath sounds normal. No respiratory distress. He has no wheezes. He has no rales. He exhibits no tenderness.  Abdominal: Soft. Bowel sounds are normal. He exhibits no mass. There is no tenderness. There is no rebound and no guarding.   Obesity present  Musculoskeletal: Normal range of motion. He exhibits no edema and no tenderness.  Lymphadenopathy:    He has no cervical adenopathy.  Neurological: He is alert and oriented to person, place, and time. He has normal reflexes. No cranial nerve deficit.  Skin: Skin is warm and dry. No rash noted. No erythema. No pallor.  Psychiatric: He has a normal mood and affect. His behavior is normal. Judgment and thought content normal.   BP 120/66  Pulse 75  Temp(Src) 98 F (36.7 C) (Oral)  Ht 5' 10"  (1.778 m)  Wt 263 lb (119.296 kg)  BMI 37.74 kg/m2  [facility] reading (PRIMARY) by  Dr. Brunilda Payor x-ray;-no active disease apparent                                   Assessment & Plan:  1. Diabetes type 2, controlled  2. HYPERLIPIDEMIA  3. Essential hypertension, benign - DG Chest 2 View; Future  4. BPH (benign prostatic hyperplasia)  5. Low serum testosterone level  6.  Vitamin D deficiency  7. Obesity  Meds ordered this encounter  Medications  . aspirin 325 MG EC tablet    Sig: Take 325 mg by mouth daily.  . furosemide (LASIX) 20 MG tablet    Sig: Take 20 mg by mouth 2 (two) times daily. As directed  . insulin glargine (LANTUS) 100 UNIT/ML injection    Sig: Inject 75 Units into the skin 2 (two) times daily. As directed  . valACYclovir (VALTREX) 1000 MG tablet    Sig: 1 daily as directed    Dispense:  30 tablet    Refill:  0      Patient Instructions  Continue current medications. Continue good therapeutic lifestyle changes which include good diet and exercise. Fall precautions discussed with patient. Schedule your flu  vaccine if you haven't had it yet If you are over 57 years old - you may need Prevnar 54 or the adult Pneumonia vaccine. Watch sodium intake in the foods that you eat Wear support hose,  when first arising in the morning Warm salty water and use saline nose spray further head congestion Continue to monitor his blood sugars and weights    Arrie Senate MD

## 2013-09-17 NOTE — Patient Instructions (Addendum)
Continue current medications. Continue good therapeutic lifestyle changes which include good diet and exercise. Fall precautions discussed with patient. Schedule your flu vaccine if you haven't had it yet If you are over 64 years old - you may need Prevnar 12 or the adult Pneumonia vaccine. Watch sodium intake in the foods that you eat Wear support hose,  when first arising in the morning Warm salty water and use saline nose spray further head congestion Continue to monitor his blood sugars and weights

## 2013-11-28 ENCOUNTER — Other Ambulatory Visit: Payer: Self-pay | Admitting: Family Medicine

## 2013-12-02 ENCOUNTER — Other Ambulatory Visit: Payer: Self-pay | Admitting: Nurse Practitioner

## 2013-12-02 ENCOUNTER — Telehealth: Payer: Self-pay | Admitting: Family Medicine

## 2013-12-02 MED ORDER — AMOXICILLIN 875 MG PO TABS: 875.0000 mg | ORAL_TABLET | Freq: Two times a day (BID) | ORAL | Status: DC

## 2013-12-02 MED ORDER — AZITHROMYCIN 250 MG PO TABS: ORAL_TABLET | ORAL | Status: DC

## 2013-12-02 NOTE — Telephone Encounter (Signed)
Patient aware amoxicillin sent to pharmacy

## 2013-12-02 NOTE — Telephone Encounter (Signed)
z pak sent to pharmacy

## 2013-12-02 NOTE — Telephone Encounter (Signed)
Patient wants to know if we can please call in Amox 500 tid. He says Dr. Laurance Flatten will usually call this in for him

## 2014-01-01 ENCOUNTER — Ambulatory Visit (INDEPENDENT_AMBULATORY_CARE_PROVIDER_SITE_OTHER): Payer: BC Managed Care – PPO | Admitting: Pulmonary Disease

## 2014-01-01 ENCOUNTER — Encounter: Payer: Self-pay | Admitting: Pulmonary Disease

## 2014-01-01 VITALS — BP 110/62 | HR 60 | Temp 98.2°F | Ht 70.0 in | Wt 267.4 lb

## 2014-01-01 DIAGNOSIS — G4733 Obstructive sleep apnea (adult) (pediatric): Secondary | ICD-10-CM

## 2014-01-01 NOTE — Patient Instructions (Signed)
Continue with cpap, and keep up with mask changes and supplies. Work on weight loss followup with me again in one year.

## 2014-01-01 NOTE — Progress Notes (Signed)
   Subjective:    Patient ID: [name], male    DOB: Jan 28, 1951, 63 y.o.   MRN: [id number]  HPI The patient comes in today for followup of his obstructive sleep apnea. He is wearing CPAP compliantly, and is having no issues with his mask fit or pressure. He is sleeping well during the night, and is satisfied with his daytime alertness. He has lost 9 pounds since last visit.   Review of Systems  Constitutional: Negative for fever and unexpected weight change.  HENT: Negative for congestion, dental problem, ear pain, nosebleeds, postnasal drip, rhinorrhea, sinus pressure, sneezing, sore throat and trouble swallowing.   Eyes: Negative for redness and itching.  Respiratory: Positive for cough. Negative for chest tightness, shortness of breath and wheezing.   Cardiovascular: Negative for palpitations and leg swelling.  Gastrointestinal: Negative for nausea and vomiting.  Genitourinary: Negative for dysuria.  Musculoskeletal: Negative for joint swelling.  Skin: Negative for rash.  Neurological: Negative for headaches.  Hematological: Does not bruise/bleed easily.  Psychiatric/Behavioral: Negative for dysphoric mood. The patient is not nervous/anxious.        Objective:   Physical Exam Overweight male in no acute distress Nose without purulence or discharge noted Neck without lymphadenopathy or thyromegaly No skin breakdown or pressure necrosis from the CPAP mask Lower extremities with minimal edema, no cyanosis Alert and oriented, moves all 4 extremities.       Assessment & Plan:

## 2014-01-01 NOTE — Assessment & Plan Note (Signed)
The patient is doing very well from a sleep apnea standpoint with his current CPAP setup. He is sleeping well with the device, and is satisfied with his daytime alertness. He is lost 9 pounds since last visit, and I've encouraged him to continue doing so. I will see him back in one year if doing well.

## 2014-01-03 ENCOUNTER — Other Ambulatory Visit (INDEPENDENT_AMBULATORY_CARE_PROVIDER_SITE_OTHER): Payer: BC Managed Care – PPO

## 2014-01-03 DIAGNOSIS — E559 Vitamin D deficiency, unspecified: Secondary | ICD-10-CM

## 2014-01-03 DIAGNOSIS — E119 Type 2 diabetes mellitus without complications: Secondary | ICD-10-CM

## 2014-01-03 DIAGNOSIS — I10 Essential (primary) hypertension: Secondary | ICD-10-CM

## 2014-01-03 DIAGNOSIS — E785 Hyperlipidemia, unspecified: Secondary | ICD-10-CM

## 2014-01-03 LAB — POCT CBC
GRANULOCYTE PERCENT: 71.4 % (ref 37–80)
HCT, POC: 48.8 % (ref 43.5–53.7)
Hemoglobin: 15.7 g/dL (ref 14.1–18.1)
Lymph, poc: 1.8 (ref 0.6–3.4)
MCH: 31.2 pg (ref 27–31.2)
MCHC: 32.1 g/dL (ref 31.8–35.4)
MCV: 97.3 fL — AB (ref 80–97)
MPV: 9.4 fL (ref 0–99.8)
POC Granulocyte: 5.2 (ref 2–6.9)
POC LYMPH PERCENT: 25 %L (ref 10–50)
Platelet Count, POC: 118 10*3/uL — AB (ref 142–424)
RBC: 5 M/uL (ref 4.69–6.13)
RDW, POC: 15.7 %
WBC: 7.3 10*3/uL (ref 4.6–10.2)

## 2014-01-03 LAB — POCT GLYCOSYLATED HEMOGLOBIN (HGB A1C): Hemoglobin A1C: 5

## 2014-01-03 NOTE — Progress Notes (Signed)
Pt came in for labs only 

## 2014-01-04 LAB — HEPATIC FUNCTION PANEL
ALT: 40 IU/L (ref 0–44)
AST: 41 IU/L — ABNORMAL HIGH (ref 0–40)
Albumin: 4 g/dL (ref 3.6–4.8)
Alkaline Phosphatase: 58 IU/L (ref 39–117)
BILIRUBIN TOTAL: 0.7 mg/dL (ref 0.0–1.2)
Bilirubin, Direct: 0.22 mg/dL (ref 0.00–0.40)
TOTAL PROTEIN: 6.9 g/dL (ref 6.0–8.5)

## 2014-01-04 LAB — NMR, LIPOPROFILE
Cholesterol: 104 mg/dL (ref <200)
HDL Cholesterol by NMR: 50 mg/dL (ref >=40)
HDL Particle Number: 30.9 umol/L (ref >=30.5)
LDL Particle Number: 568 nmol/L (ref <1000)
LDL Size: 21.1 nm (ref >20.5)
LDLC SERPL CALC-MCNC: 44 mg/dL (ref <100)
LP-IR Score: 30 (ref <=45)
SMALL LDL PARTICLE NUMBER: 242 nmol/L (ref <=527)
TRIGLYCERIDES BY NMR: 49 mg/dL (ref <150)

## 2014-01-04 LAB — BMP8+EGFR
BUN/Creatinine Ratio: 12 (ref 10–22)
BUN: 11 mg/dL (ref 8–27)
CALCIUM: 9.4 mg/dL (ref 8.6–10.2)
CO2: 28 mmol/L (ref 18–29)
CREATININE: 0.92 mg/dL (ref 0.76–1.27)
Chloride: 100 mmol/L (ref 97–108)
GFR calc Af Amer: 101 mL/min/{1.73_m2} (ref >59)
GFR calc non Af Amer: 88 mL/min/{1.73_m2} (ref >59)
GLUCOSE: 79 mg/dL (ref 65–99)
Potassium: 5.3 mmol/L — ABNORMAL HIGH (ref 3.5–5.2)
Sodium: 141 mmol/L (ref 134–144)

## 2014-01-04 LAB — VITAMIN D 25 HYDROXY (VIT D DEFICIENCY, FRACTURES): Vit D, 25-Hydroxy: 49.5 ng/mL (ref 30.0–100.0)

## 2014-01-08 ENCOUNTER — Other Ambulatory Visit: Payer: BC Managed Care – PPO

## 2014-01-08 DIAGNOSIS — Z1212 Encounter for screening for malignant neoplasm of rectum: Secondary | ICD-10-CM

## 2014-01-08 NOTE — Progress Notes (Signed)
Pt came in for lab only

## 2014-01-10 LAB — FECAL OCCULT BLOOD, IMMUNOCHEMICAL: Fecal Occult Bld: NEGATIVE

## 2014-01-13 ENCOUNTER — Ambulatory Visit (INDEPENDENT_AMBULATORY_CARE_PROVIDER_SITE_OTHER): Payer: BC Managed Care – PPO | Admitting: Family Medicine

## 2014-01-13 ENCOUNTER — Encounter: Payer: Self-pay | Admitting: Family Medicine

## 2014-01-13 VITALS — BP 108/61 | HR 67 | Temp 97.0°F | Ht 70.0 in | Wt 261.0 lb

## 2014-01-13 DIAGNOSIS — E349 Endocrine disorder, unspecified: Secondary | ICD-10-CM

## 2014-01-13 DIAGNOSIS — I10 Essential (primary) hypertension: Secondary | ICD-10-CM

## 2014-01-13 DIAGNOSIS — E875 Hyperkalemia: Secondary | ICD-10-CM

## 2014-01-13 DIAGNOSIS — E119 Type 2 diabetes mellitus without complications: Secondary | ICD-10-CM

## 2014-01-13 DIAGNOSIS — D696 Thrombocytopenia, unspecified: Secondary | ICD-10-CM

## 2014-01-13 DIAGNOSIS — E291 Testicular hypofunction: Secondary | ICD-10-CM

## 2014-01-13 DIAGNOSIS — J309 Allergic rhinitis, unspecified: Secondary | ICD-10-CM

## 2014-01-13 DIAGNOSIS — N390 Urinary tract infection, site not specified: Secondary | ICD-10-CM

## 2014-01-13 DIAGNOSIS — E785 Hyperlipidemia, unspecified: Secondary | ICD-10-CM

## 2014-01-13 DIAGNOSIS — E559 Vitamin D deficiency, unspecified: Secondary | ICD-10-CM

## 2014-01-13 DIAGNOSIS — N400 Enlarged prostate without lower urinary tract symptoms: Secondary | ICD-10-CM

## 2014-01-13 LAB — POCT UA - MICROSCOPIC ONLY
CASTS, UR, LPF, POC: NEGATIVE
CRYSTALS, UR, HPF, POC: NEGATIVE
MUCUS UA: NEGATIVE
YEAST UA: NEGATIVE

## 2014-01-13 LAB — POCT URINALYSIS DIPSTICK
Bilirubin, UA: NEGATIVE
GLUCOSE UA: NEGATIVE
Ketones, UA: NEGATIVE
Nitrite, UA: NEGATIVE
PH UA: 6
Protein, UA: NEGATIVE
UROBILINOGEN UA: NEGATIVE

## 2014-01-13 LAB — POCT UA - MICROALBUMIN: Microalbumin Ur, POC: NEGATIVE mg/L

## 2014-01-13 MED ORDER — INSULIN GLARGINE 100 UNIT/ML ~~LOC~~ SOLN: 75.0000 [IU] | Freq: Two times a day (BID) | SUBCUTANEOUS | Status: DC

## 2014-01-13 MED ORDER — MONTELUKAST SODIUM 10 MG PO TABS: 10.0000 mg | ORAL_TABLET | Freq: Every day | ORAL | Status: DC

## 2014-01-13 MED ORDER — GLUCOSE BLOOD VI STRP: ORAL_STRIP | Status: DC

## 2014-01-13 MED ORDER — FUROSEMIDE 20 MG PO TABS: 30.0000 mg | ORAL_TABLET | Freq: Every day | ORAL | Status: DC

## 2014-01-13 MED ORDER — VALSARTAN 160 MG PO TABS: 160.0000 mg | ORAL_TABLET | Freq: Every day | ORAL | Status: DC

## 2014-01-13 MED ORDER — NIACIN ER (ANTIHYPERLIPIDEMIC) 500 MG PO TBCR: 500.0000 mg | EXTENDED_RELEASE_TABLET | Freq: Every day | ORAL | Status: DC

## 2014-01-13 MED ORDER — "INSULIN SYRINGE-NEEDLE U-100 31G X 15/64"" 1 ML MISC": 1.0000 | Freq: Two times a day (BID) | Status: DC

## 2014-01-13 MED ORDER — ATORVASTATIN CALCIUM 40 MG PO TABS: 40.0000 mg | ORAL_TABLET | Freq: Every day | ORAL | Status: DC

## 2014-01-13 MED ORDER — TESTOSTERONE 30 MG/ACT TD SOLN: 1.0000 "application " | Freq: Every day | TRANSDERMAL | Status: DC

## 2014-01-13 MED ORDER — INSULIN LISPRO 100 UNIT/ML ~~LOC~~ SOLN: 20.0000 [IU] | Freq: Three times a day (TID) | SUBCUTANEOUS | Status: DC

## 2014-01-13 MED ORDER — "INSULIN SYRINGE-NEEDLE U-100 31G X 5/16"" 0.5 ML MISC": 8.0000 | Status: DC

## 2014-01-13 MED ORDER — BD LANCET ULTRAFINE 33G MISC: Status: DC

## 2014-01-13 MED ORDER — PANTOPRAZOLE SODIUM 40 MG PO TBEC: DELAYED_RELEASE_TABLET | ORAL | Status: DC

## 2014-01-13 MED ORDER — TRAMADOL HCL 50 MG PO TABS: 50.0000 mg | ORAL_TABLET | Freq: Four times a day (QID) | ORAL | Status: DC | PRN

## 2014-01-13 NOTE — Addendum Note (Signed)
Addended by: Zannie Cove on: 01/13/2014 10:00 AM   Modules accepted: Orders

## 2014-01-13 NOTE — Patient Instructions (Addendum)
Continue current medications. Continue good therapeutic lifestyle changes which include good diet and exercise. Fall precautions discussed with patient. If an FOBT was given today- please return it to our front desk. If you are over 64 years old - you may need Prevnar 86 or the adult Pneumonia vaccine.  Plan to repeat the CBC in 6 weeks Try to get all of the diet drinks Drink more water The results of the PSA and the potassium that will be done today. You can purchase Flonase over-the-counter 1-2 sprays each nostril at bedtime If the platelet count remains low or is lower we will most likely schedule you a one-time visit with the hematologist Continue to monitor blood sugars at home

## 2014-01-13 NOTE — Progress Notes (Signed)
Subjective:    Patient ID: Tony Cantu, male    DOB: 1950-10-30, 63 y.o.   MRN: [id number]  HPI Pt here for follow up and management of chronic medical problems. Labs will be reviewed with the patient today. He is on testosterone replacement. He will need a prostate and PSA done. His hemoglobin was good. His platelet count was slightly decreased. This will be followed up in 4-6 weeks. His only complaints today are dealing with the allergy season.           Patient Active Problem List   Diagnosis Date Noted  . Vitamin D deficiency 09/17/2013  . BPH (benign prostatic hyperplasia) 05/22/2013  . 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 Diabetes type 2, controlled 01/10/2013  . Essential hypertension, benign 01/10/2013  . OSA (obstructive sleep apnea) 05/16/2012  . Edema 02/21/2012  . At risk for coronary artery disease 03/20/2011  . Obesity 03/20/2011  . WEIGHT GAIN, ABNORMAL 11/09/2010  . HYPERLIPIDEMIA 06/22/2010  . ALLERGIC RHINITIS 06/22/2010  . ASTHMA 06/22/2010  . COUGH 06/22/2010   Outpatient Encounter Prescriptions as of 01/13/2014  Medication Sig  . aspirin 325 MG EC tablet Take 325 mg by mouth daily.  Marland Kitchen atorvastatin (LIPITOR) 40 MG tablet Take 1 tablet (40 mg total) by mouth daily.  Marland Kitchen b complex vitamins tablet Take 1 tablet by mouth daily.    . B-D ULTRA-FINE 33 LANCETS MISC by Does not apply route. Check Blood sugar 8 times daily and PRN.DX 250.0  . Calcium Carbonate (CALTRATE 600 PO) Take by mouth daily.    . Cholecalciferol (VITAMIN D3) 2000 UNITS capsule Take 2,000 Units by mouth daily.    . furosemide (LASIX) 20 MG tablet Take 30 mg by mouth daily. As directed  . Glucosamine-Chondroit-Vit C-Mn (GLUCOSAMINE 1500 COMPLEX) CAPS Take by mouth daily.    Marland Kitchen glucose blood (ONE TOUCH ULTRA TEST) test strip chesk BS q 2 hours PRN  . insulin glargine (LANTUS) 100 UNIT/ML injection Inject 75 Units into the skin 2 (two) times daily. As directed  . insulin lispro  (HUMALOG) 100 UNIT/ML injection Inject 20-50 Units into the skin 3 (three) times daily before meals. Sliding scale  . Insulin Syringe-Needle U-100 (BD INSULIN SYRINGE ULTRAFINE) 31G X 15/64" 1 ML MISC Inject 1 Syringe into the skin 2 (two) times daily.  . Insulin Syringe-Needle U-100 (BD INSULIN SYRINGE ULTRAFINE) 31G X 5/16" 0.5 ML MISC Inject 8 Syringes into the skin every 2 (two) hours.  Marland Kitchen loratadine (CLARITIN) 10 MG tablet Take 10 mg by mouth daily.  . montelukast (SINGULAIR) 10 MG tablet Take 1 tablet (10 mg total) by mouth at bedtime.  . niacin (NIASPAN) 500 MG CR tablet Take 1 tablet (500 mg total) by mouth daily.  . pantoprazole (PROTONIX) 40 MG tablet TAKE 1 TABLET DAILY  . ranitidine (ZANTAC) 75 MG tablet Take 75 mg by mouth at bedtime.  . Testosterone (AXIRON) 30 MG/ACT SOLN Place 1 application onto the skin daily. Place 1 application under each axilla daily  . traMADol (ULTRAM) 50 MG tablet Take 1 tablet (50 mg total) by mouth every 6 (six) hours as needed.  . valsartan (DIOVAN) 160 MG tablet Take 160 mg by mouth daily.  . [DISCONTINUED] Lancets (BD LANCET ULTRAFINE 30G) MISC Inject 1 Device into the skin every 2 (two) hours.  . [DISCONTINUED] valACYclovir (VALTREX) 1000 MG tablet 1 daily as directed    Review of Systems  Constitutional: Negative.   HENT: Negative.  Allergies / pollen  Eyes: Negative.   Respiratory: Negative.   Cardiovascular: Negative.   Gastrointestinal: Negative.   Endocrine: Negative.   Genitourinary: Negative.   Musculoskeletal: Negative.   Skin: Negative.   Allergic/Immunologic: Negative.   Neurological: Negative.   Hematological: Negative.   Psychiatric/Behavioral: Negative.        Objective:   Physical Exam  Nursing note and vitals reviewed. Constitutional: He is oriented to person, place, and time. He appears well-developed and well-nourished. No distress.  HENT:  Head: Normocephalic and atraumatic.  Right Ear: External ear normal.    Left Ear: External ear normal.  Mouth/Throat: Oropharynx is clear and moist. No oropharyngeal exudate.  Nasal congestion bilaterally with turbinate swelling  Eyes: Conjunctivae and EOM are normal. Pupils are equal, round, and reactive to light. Right eye exhibits no discharge. Left eye exhibits no discharge. No scleral icterus.  Neck: Normal range of motion. Neck supple. No tracheal deviation present. No thyromegaly present.  Cardiovascular: Normal rate, regular rhythm, normal heart sounds and intact distal pulses.  Exam reveals no gallop and no friction rub.   No murmur heard. 72 per minute  Pulmonary/Chest: Effort normal and breath sounds normal. No respiratory distress. He has no wheezes. He has no rales. He exhibits no tenderness.  Abdominal: Soft. Bowel sounds are normal. He exhibits no mass. There is no tenderness. There is no rebound and no guarding.  Genitourinary: Rectum normal and penis normal.  The prostate was enlarged but soft and smooth without any lumps or masses. The rectal did not have any masses. 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 There was no inguinal hernias palpated and no inguinal nodes present.  Musculoskeletal: Normal range of motion. He exhibits no edema and no tenderness.  Lymphadenopathy:    He has no cervical adenopathy.  Neurological: He is alert and oriented to person, place, and time. He has normal reflexes. No cranial nerve deficit.  Skin: Skin is warm and dry. No rash noted. No erythema. No pallor.  Psychiatric: He has a normal mood and affect. His behavior is normal. Judgment and thought content normal.   BP 108/61  Pulse 67  Temp(Src) 97 F (36.1 C) (Oral)  Ht _0  (1.778 m)  Wt 261 lb (118.389 kg)  BMI 37.45 kg/m2        Assessment & Plan:  1. BPH (benign prostatic hyperplasia) - POCT UA - Microscopic Only - POCT UA - Microalbumin - POCT urinalysis dipstick - Testosterone,Free and Total - PSA, total and  free  2. Diabetes type 2, controlled  3. HYPERLIPIDEMIA  4. Essential hypertension, benign  5. Vitamin D deficiency  6. Testosterone deficiency - POCT UA - Microscopic Only - POCT UA - Microalbumin - POCT urinalysis dipstick - Testosterone,Free and Total - PSA, total and free  7. Thrombocytopenia  8. Serum potassium elevated - BMP8+EGFR  9. Allergic rhinitis -Flonase one to 2 sprays each nostril nightly  Meds ordered this encounter  Medications  . DISCONTD: Insulin Syringe-Needle U-100 (BD INSULIN SYRINGE ULTRAFINE) 31G X 15/64" 1 ML MISC    Sig: Inject 1 Syringe into the skin 2 (two) times daily.  Marland Kitchen DISCONTD: B-D ULTRA-FINE 33 LANCETS MISC    Sig: by Does not apply route. Check Blood sugar 8 times daily and PRN.DX 250.0  . atorvastatin (LIPITOR) 40 MG tablet    Sig: Take 1 tablet (40 mg total) by mouth daily.    Dispense:  90 tablet    Refill:  3  . B-D ULTRA-FINE 33 LANCETS MISC    Sig: Check Blood sugar 8 times daily and PRN.DX 250.0    Dispense:  800 each    Refill:  3  . furosemide (LASIX) 20 MG tablet    Sig: Take 1.5 tablets (30 mg total) by mouth daily. As directed    Dispense:  90 tablet    Refill:  3  . glucose blood (ONE TOUCH ULTRA TEST) test strip    Sig: chesk BS q 2 hours PRN.dx.250.0    Dispense:  800 each    Refill:  4  . insulin glargine (LANTUS) 100 UNIT/ML injection    Sig: Inject 0.75 mLs (75 Units total) into the skin 2 (two) times daily. As directed    Dispense:  12 vial    Refill:  3  . insulin lispro (HUMALOG) 100 UNIT/ML injection    Sig: Inject 0.2-0.5 mLs (20-50 Units total) into the skin 3 (three) times daily before meals. Sliding scale    Dispense:  12 vial    Refill:  3  . DISCONTD: Insulin Syringe-Needle U-100 (BD INSULIN SYRINGE ULTRAFINE) 31G X 15/64" 1 ML MISC    Sig: Inject 1 Syringe into the skin 2 (two) times daily. Dx.250.0    Dispense:  180 each    Refill:  3  . Insulin Syringe-Needle U-100 (BD INSULIN SYRINGE  ULTRAFINE) 31G X 5/16" 0.5 ML MISC    Sig: Inject 8 Syringes into the skin every 2 (two) hours. Dx.250.0    Dispense:  720 each    Refill:  4  . montelukast (SINGULAIR) 10 MG tablet    Sig: Take 1 tablet (10 mg total) by mouth at bedtime.    Dispense:  90 tablet    Refill:  4  . niacin (NIASPAN) 500 MG CR tablet    Sig: Take 1 tablet (500 mg total) by mouth daily.    Dispense:  90 tablet    Refill:  4  . pantoprazole (PROTONIX) 40 MG tablet    Sig: TAKE 1 TABLET DAILY    Dispense:  90 tablet    Refill:  3  . Testosterone (AXIRON) 30 MG/ACT SOLN    Sig: Place 1 application onto the skin daily. Place 1 application under each axilla daily    Dispense:  270 mL    Refill:  3  . traMADol (ULTRAM) 50 MG tablet    Sig: Take 1 tablet (50 mg total) by mouth every 6 (six) hours as needed.    Dispense:  90 tablet    Refill:  1  . valsartan (DIOVAN) 160 MG tablet    Sig: Take 1 tablet (160 mg total) by mouth daily.    Dispense:  90 tablet    Refill:  3  . Insulin Syringe-Needle U-100 (BD INSULIN SYRINGE ULTRAFINE) 31G X 15/64" 1 ML MISC    Sig: Inject 1 Syringe into the skin 2 (two) times daily. Dx.250.0    Dispense:  180 each    Refill:  3   Patient Instructions  Continue current medications. Continue good therapeutic lifestyle changes which include good diet and exercise. Fall precautions discussed with patient. If an FOBT was given today- please return it to our front desk. If you are over 104 years old - you may need Prevnar 66 or the adult Pneumonia vaccine.  Plan to repeat the CBC in 6 weeks Try to get all of the diet drinks Drink more water The results of the PSA  and the potassium that will be done today. You can purchase Flonase over-the-counter 1-2 sprays each nostril at bedtime If the platelet count remains low or is lower we will most likely schedule you a one-time visit with the hematologist Continue to monitor blood sugars at home   Arrie Senate MD

## 2014-01-13 NOTE — Addendum Note (Signed)
Addended by: Pollyann Kennedy F on: 01/13/2014 12:06 PM   Modules accepted: Orders

## 2014-01-14 LAB — PSA, TOTAL AND FREE
PSA FREE PCT: 37.5 %
PSA FREE: 0.45 ng/mL
PSA: 1.2 ng/mL (ref 0.0–4.0)

## 2014-01-14 LAB — BMP8+EGFR
BUN/Creatinine Ratio: 11 (ref 10–22)
BUN: 12 mg/dL (ref 8–27)
CO2: 23 mmol/L (ref 18–29)
CREATININE: 1.1 mg/dL (ref 0.76–1.27)
Calcium: 9.3 mg/dL (ref 8.6–10.2)
Chloride: 103 mmol/L (ref 97–108)
GFR calc Af Amer: 82 mL/min/{1.73_m2} (ref >59)
GFR calc non Af Amer: 71 mL/min/{1.73_m2} (ref >59)
GLUCOSE: 40 mg/dL — AB (ref 65–99)
Potassium: 4.4 mmol/L (ref 3.5–5.2)
SODIUM: 142 mmol/L (ref 134–144)

## 2014-01-14 LAB — TESTOSTERONE,FREE AND TOTAL
Testosterone, Free: 13.9 pg/mL (ref 6.6–18.1)
Testosterone: 883 ng/dL (ref 348–1197)

## 2014-01-16 LAB — URINE CULTURE

## 2014-01-17 ENCOUNTER — Telehealth: Payer: Self-pay | Admitting: Family Medicine

## 2014-01-17 NOTE — Telephone Encounter (Signed)
Just called in patient aware

## 2014-01-21 ENCOUNTER — Telehealth: Payer: Self-pay | Admitting: Family Medicine

## 2014-01-21 ENCOUNTER — Telehealth: Payer: Self-pay | Admitting: *Deleted

## 2014-01-21 NOTE — Telephone Encounter (Signed)
Left message, call to discuss urine results,  Needs medicine.

## 2014-01-21 NOTE — Telephone Encounter (Signed)
Error. Wrong patient.

## 2014-01-21 NOTE — Telephone Encounter (Signed)
Patient aware.

## 2014-01-29 ENCOUNTER — Other Ambulatory Visit (INDEPENDENT_AMBULATORY_CARE_PROVIDER_SITE_OTHER): Payer: BC Managed Care – PPO

## 2014-01-29 DIAGNOSIS — D696 Thrombocytopenia, unspecified: Secondary | ICD-10-CM

## 2014-01-29 DIAGNOSIS — E559 Vitamin D deficiency, unspecified: Secondary | ICD-10-CM

## 2014-01-29 DIAGNOSIS — J309 Allergic rhinitis, unspecified: Secondary | ICD-10-CM

## 2014-01-29 DIAGNOSIS — E875 Hyperkalemia: Secondary | ICD-10-CM

## 2014-01-29 DIAGNOSIS — E291 Testicular hypofunction: Secondary | ICD-10-CM

## 2014-01-29 DIAGNOSIS — I10 Essential (primary) hypertension: Secondary | ICD-10-CM

## 2014-01-29 DIAGNOSIS — N400 Enlarged prostate without lower urinary tract symptoms: Secondary | ICD-10-CM

## 2014-01-29 DIAGNOSIS — E349 Endocrine disorder, unspecified: Secondary | ICD-10-CM

## 2014-01-29 DIAGNOSIS — E119 Type 2 diabetes mellitus without complications: Secondary | ICD-10-CM

## 2014-01-29 DIAGNOSIS — E785 Hyperlipidemia, unspecified: Secondary | ICD-10-CM

## 2014-01-29 LAB — POCT URINALYSIS DIPSTICK
Bilirubin, UA: NEGATIVE
Glucose, UA: NEGATIVE
KETONES UA: NEGATIVE
LEUKOCYTES UA: NEGATIVE
Nitrite, UA: NEGATIVE
PROTEIN UA: NEGATIVE
Spec Grav, UA: <=1.005
UROBILINOGEN UA: NEGATIVE
pH, UA: 7.5

## 2014-01-29 LAB — POCT UA - MICROSCOPIC ONLY
CASTS, UR, LPF, POC: NEGATIVE
CRYSTALS, UR, HPF, POC: NEGATIVE
Epithelial cells, urine per micros: NEGATIVE
Mucus, UA: NEGATIVE
Yeast, UA: NEGATIVE

## 2014-01-31 ENCOUNTER — Other Ambulatory Visit: Payer: Self-pay | Admitting: Family Medicine

## 2014-02-10 ENCOUNTER — Encounter: Payer: Self-pay | Admitting: Family Medicine

## 2014-02-11 ENCOUNTER — Other Ambulatory Visit: Payer: Self-pay | Admitting: *Deleted

## 2014-02-11 DIAGNOSIS — R200 Anesthesia of skin: Secondary | ICD-10-CM

## 2014-02-11 NOTE — Telephone Encounter (Signed)
This is to be ordered.

## 2014-02-12 ENCOUNTER — Encounter: Payer: Self-pay | Admitting: Family Medicine

## 2014-02-13 MED ORDER — FUROSEMIDE 20 MG PO TABS: 30.0000 mg | ORAL_TABLET | Freq: Every day | ORAL | Status: DC

## 2014-02-17 ENCOUNTER — Encounter: Payer: Self-pay | Admitting: Family Medicine

## 2014-02-20 ENCOUNTER — Other Ambulatory Visit (INDEPENDENT_AMBULATORY_CARE_PROVIDER_SITE_OTHER): Payer: BC Managed Care – PPO

## 2014-02-20 ENCOUNTER — Telehealth: Payer: Self-pay | Admitting: Family Medicine

## 2014-02-20 DIAGNOSIS — R7989 Other specified abnormal findings of blood chemistry: Secondary | ICD-10-CM

## 2014-02-20 LAB — POCT CBC
GRANULOCYTE PERCENT: 75.7 % (ref 37–80)
HCT, POC: 47.9 % (ref 43.5–53.7)
Hemoglobin: 15.6 g/dL (ref 14.1–18.1)
Lymph, poc: 1.8 (ref 0.6–3.4)
MCH, POC: 31 pg (ref 27–31.2)
MCHC: 32.5 g/dL (ref 31.8–35.4)
MCV: 95.4 fL (ref 80–97)
MPV: 8.6 fL (ref 0–99.8)
PLATELET COUNT, POC: 143 10*3/uL (ref 142–424)
POC Granulocyte: 6.1 (ref 2–6.9)
POC LYMPH PERCENT: 22.4 %L (ref 10–50)
RBC: 5 M/uL (ref 4.69–6.13)
RDW, POC: 15.6 %
WBC: 8.1 10*3/uL (ref 4.6–10.2)

## 2014-02-20 NOTE — Telephone Encounter (Signed)
They changed their mind about leaving message

## 2014-02-21 ENCOUNTER — Encounter: Payer: Self-pay | Admitting: Family Medicine

## 2014-02-21 LAB — BMP8+EGFR
BUN / CREAT RATIO: 12 (ref 10–22)
BUN: 13 mg/dL (ref 8–27)
CALCIUM: 9.5 mg/dL (ref 8.6–10.2)
CO2: 25 mmol/L (ref 18–29)
Chloride: 101 mmol/L (ref 97–108)
Creatinine, Ser: 1.13 mg/dL (ref 0.76–1.27)
GFR calc Af Amer: 79 mL/min/{1.73_m2} (ref >59)
GFR, EST NON AFRICAN AMERICAN: 68 mL/min/{1.73_m2} (ref >59)
Glucose: 103 mg/dL — ABNORMAL HIGH (ref 65–99)
Potassium: 4.6 mmol/L (ref 3.5–5.2)
Sodium: 140 mmol/L (ref 134–144)

## 2014-02-21 MED ORDER — PANTOPRAZOLE SODIUM 40 MG PO TBEC: DELAYED_RELEASE_TABLET | ORAL | Status: DC

## 2014-02-23 ENCOUNTER — Encounter: Payer: Self-pay | Admitting: Family Medicine

## 2014-02-24 ENCOUNTER — Telehealth: Payer: Self-pay | Admitting: Family Medicine

## 2014-02-24 NOTE — Telephone Encounter (Signed)
Message copied by Waverly Ferrari on Mon Feb 24, 2014 11:57 AM ------      Message from: Chipper Herb      Created: Fri Feb 21, 2014 10:47 AM       The blood sugar is slightly elevated at 103, the creatinine remained stable at 1.13. The electrolytes including potassium are within normal limits. ------

## 2014-02-26 ENCOUNTER — Ambulatory Visit: Payer: BC Managed Care – PPO | Admitting: Family Medicine

## 2014-02-27 ENCOUNTER — Other Ambulatory Visit: Payer: Self-pay | Admitting: *Deleted

## 2014-02-27 ENCOUNTER — Encounter: Payer: Self-pay | Admitting: Family Medicine

## 2014-02-27 MED ORDER — "INSULIN SYRINGE-NEEDLE U-100 31G X 15/64"" 1 ML MISC": 1.0000 | Freq: Two times a day (BID) | Status: DC

## 2014-02-27 MED ORDER — "INSULIN SYRINGE-NEEDLE U-100 31G X 5/16"" 0.5 ML MISC": 8.0000 | Status: DC

## 2014-02-27 NOTE — Progress Notes (Signed)
Patient ID: Tony Cantu, male   DOB: [birth year], 63 y.o.   MRN: [id number]   This patient has tingling in both hands worse with certain positions. It can also occur at nighttime depending on which side he is laying on. If he is laying on the left side, tingling is on the right and vice versa. The left side tingling is usually worse than the right side tingling. He has had bilateral shoulder surgery and has a history of seeing a neurosurgeon years ago. At that time he was treated with cervical traction. The physical exam had a negative Tinel sign. With flexion of the wrist no discomfort was reproduced in the hands. His reflexes were decreased on the left greater than the right. Please evaluate and treat this patient with PNCV testing.  Arrie Senate M.D. [facility]  [facility]

## 2014-03-03 ENCOUNTER — Encounter: Payer: Self-pay | Admitting: Family Medicine

## 2014-03-03 ENCOUNTER — Ambulatory Visit: Payer: BC Managed Care – PPO | Admitting: Family Medicine

## 2014-03-04 ENCOUNTER — Encounter: Payer: Self-pay | Admitting: Family Medicine

## 2014-03-12 ENCOUNTER — Encounter: Payer: Self-pay | Admitting: *Deleted

## 2014-03-14 ENCOUNTER — Ambulatory Visit (INDEPENDENT_AMBULATORY_CARE_PROVIDER_SITE_OTHER): Payer: BC Managed Care – PPO

## 2014-03-14 ENCOUNTER — Encounter: Payer: Self-pay | Admitting: Family Medicine

## 2014-03-14 DIAGNOSIS — G5600 Carpal tunnel syndrome, unspecified upper limb: Secondary | ICD-10-CM

## 2014-03-14 DIAGNOSIS — G5603 Carpal tunnel syndrome, bilateral upper limbs: Secondary | ICD-10-CM

## 2014-03-14 NOTE — Procedures (Signed)
     HISTORY:  Tony Cantu is a 63 year old gentleman with a history of paresthesias involving both upper extremities. The patient is being evaluated for a possible neuropathy. The patient does have a history of diabetes.  NERVE CONDUCTION STUDIES:  Nerve conduction studies were performed on both upper extremities. The distal motor latencies for the median nerves were prolonged bilaterally, with normal motor amplitudes for these nerves bilaterally. The distal motor latencies and motor amplitudes for the ulnar nerves were normal bilaterally. The F wave latencies for the median nerves were prolonged bilaterally, borderline normal for the right ulnar nerve, normal for the left ulnar nerve. The nerve conduction velocities for the median and ulnar nerves were normal bilaterally. The sensory latencies for the median nerves were prolonged bilaterally, normal for the ulnar nerves bilaterally.  EMG STUDIES:  EMG evaluation was not performed.  IMPRESSION:  Nerve conduction studies done on both upper extremities reveals evidence of bilateral mild carpal tunnel syndrome. No other significant abnormalities were seen.  Jill Alexanders MD 03/14/2014 1:07 PM  [facility] [address] [place], [place] [address]  Phone [phone] Fax [phone]

## 2014-04-04 ENCOUNTER — Encounter: Payer: Self-pay | Admitting: *Deleted

## 2014-04-07 ENCOUNTER — Other Ambulatory Visit: Payer: Self-pay | Admitting: Nurse Practitioner

## 2014-04-07 ENCOUNTER — Encounter: Payer: Self-pay | Admitting: Family Medicine

## 2014-04-08 ENCOUNTER — Encounter: Payer: Self-pay | Admitting: Nurse Practitioner

## 2014-04-18 ENCOUNTER — Ambulatory Visit: Payer: BC Managed Care – PPO | Admitting: Cardiology

## 2014-04-27 ENCOUNTER — Other Ambulatory Visit: Payer: Self-pay | Admitting: Family Medicine

## 2014-04-29 ENCOUNTER — Encounter: Payer: Self-pay | Admitting: Family Medicine

## 2014-04-30 ENCOUNTER — Encounter: Payer: Self-pay | Admitting: Family Medicine

## 2014-04-30 ENCOUNTER — Telehealth: Payer: Self-pay | Admitting: *Deleted

## 2014-04-30 MED ORDER — FUROSEMIDE 20 MG PO TABS: 30.0000 mg | ORAL_TABLET | Freq: Every day | ORAL | Status: DC

## 2014-04-30 NOTE — Telephone Encounter (Signed)
Lasix refilled.

## 2014-05-08 ENCOUNTER — Encounter: Payer: Self-pay | Admitting: Cardiology

## 2014-05-08 ENCOUNTER — Encounter: Payer: Self-pay | Admitting: *Deleted

## 2014-05-08 ENCOUNTER — Ambulatory Visit (INDEPENDENT_AMBULATORY_CARE_PROVIDER_SITE_OTHER): Payer: BC Managed Care – PPO | Admitting: Cardiology

## 2014-05-08 VITALS — BP 124/60 | HR 57 | Ht 70.0 in | Wt 258.0 lb

## 2014-05-08 DIAGNOSIS — R0989 Other specified symptoms and signs involving the circulatory and respiratory systems: Secondary | ICD-10-CM

## 2014-05-08 DIAGNOSIS — G4733 Obstructive sleep apnea (adult) (pediatric): Secondary | ICD-10-CM

## 2014-05-08 DIAGNOSIS — E785 Hyperlipidemia, unspecified: Secondary | ICD-10-CM

## 2014-05-08 DIAGNOSIS — I10 Essential (primary) hypertension: Secondary | ICD-10-CM

## 2014-05-08 NOTE — Patient Instructions (Signed)
Your physician has requested that you have a carotid duplex. This test is an ultrasound of the carotid arteries in your neck. It looks at blood flow through these arteries that supply the brain with blood. Allow one hour for this exam. There are no restrictions or special instructions.  Your physician wants you to follow-up in: 1 year with Dr Aundra Dubin. (September 2016). You will receive a reminder letter in the mail two months in advance. If you don't receive a letter, please call our office to schedule the follow-up appointment.

## 2014-05-11 DIAGNOSIS — R0989 Other specified symptoms and signs involving the circulatory and respiratory systems: Secondary | ICD-10-CM | POA: Insufficient documentation

## 2014-05-11 NOTE — Progress Notes (Signed)
Patient ID: Tony Cantu, male   DOB: [birth year], 63 y.o.   MRN: [id number] PCP: Dr. Laurance Flatten  63 yo with history of HTN, DM, obesity presents for cardiology followup.   He had a cardiolite in 2009 that was a normal study.  He has been doing well in general.  He golfs about once a week at [place].  No chest pain or dyspnea with exertion.  BP and lipids are under good control.  He is wearing CPAP which seems to help.  He has lost 10 lbs since last year's visit.   ECG: NSR, RBBB (no change)  Labs (7/12): hgbA1c 5.8 Labs (6/13): K 4.7, creatinine 0.93, LDL particle number 859, LDL 54, HDL 57 Labs (5/14): K 4.9, creatinine 0.82, LDL 45, LDL particle number 725 Labs (5/15): LDL-P 568, LDL 44, HDL 56 Labs (6/15): K 4.6, creatinine 1.13  PMH: 1. Obesity 2. HTN 3. Diabetes mellitus with history of hypoglycemic episodes.  4. Hyperlipidemia 5. Cardiolite (6/09): Normal study.  6. Lumbar spine surgery in 1994 7. Hernia repair in 2003 8. Plantar fasciitis 9. Shoulder replacement 10. OSA on CPAP 11. Echo (7/13): EF 55-60%, mild LVH, mild RV dilation, mildly decreased RV systolic function.   SH: Divorced in 2004 and remarried now.  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 Likes golf.   FH: Grandmother with MI in her 62s, mother with MI in her 58s, father died in his 49s (suicide).   Current Outpatient Prescriptions  Medication Sig Dispense Refill  . aspirin 325 MG EC tablet Take 325 mg by mouth daily.      Marland Kitchen atorvastatin (LIPITOR) 40 MG tablet Take 1 tablet (40 mg total) by mouth daily.  90 tablet  3  . b complex vitamins tablet Take 1 tablet by mouth daily.        . B-D ULTRA-FINE 33 LANCETS MISC Check Blood sugar 8 times daily and PRN.DX 250.0  800 each  3  . Calcium Carbonate (CALTRATE 600 PO) Take by mouth daily.        . Cholecalciferol (VITAMIN D3) 2000 UNITS capsule Take 2,000 Units by mouth daily.        . furosemide (LASIX) 20 MG tablet Take 1.5 tablets  (30 mg total) by mouth daily.  135 tablet  3  . Glucosamine-Chondroit-Vit C-Mn (GLUCOSAMINE 1500 COMPLEX) CAPS Take by mouth daily.        Marland Kitchen glucose blood (ONE TOUCH ULTRA TEST) test strip chesk BS q 2 hours PRN.dx.250.0  800 each  4  . insulin glargine (LANTUS) 100 UNIT/ML injection Inject 0.75 mLs (75 Units total) into the skin 2 (two) times daily. As directed  12 vial  3  . insulin lispro (HUMALOG) 100 UNIT/ML injection Inject 0.2-0.5 mLs (20-50 Units total) into the skin 3 (three) times daily before meals. Sliding scale  12 vial  3  . Insulin Syringe-Needle U-100 (BD INSULIN SYRINGE ULTRAFINE) 31G X 15/64" 1 ML MISC Inject 1 Syringe into the skin 2 (two) times daily. Dx.250.0  300 each  3  . Insulin Syringe-Needle U-100 (BD INSULIN SYRINGE ULTRAFINE) 31G X 5/16" 0.5 ML MISC Inject 8 Syringes into the skin every 2 (two) hours. Dx.250.0  800 each  3  . loratadine (CLARITIN) 10 MG tablet Take 10 mg by mouth daily.      . montelukast (SINGULAIR) 10 MG tablet TAKE 1 TABLET AT BEDTIME  90 tablet  3  . MULTIPLE VITAMIN PO Take by  mouth daily.      . niacin (NIASPAN) 500 MG CR tablet Take 1 tablet (500 mg total) by mouth daily.  90 tablet  4  . pantoprazole (PROTONIX) 40 MG tablet TAKE 1 TABLET DAILY  90 tablet  3  . ranitidine (ZANTAC) 75 MG tablet Take 75 mg by mouth at bedtime.      . Testosterone (AXIRON) 30 MG/ACT SOLN Place 1 application onto the skin daily. Place 1 application under each axilla daily  270 mL  3  . traMADol (ULTRAM) 50 MG tablet Take 1 tablet (50 mg total) by mouth every 6 (six) hours as needed.  90 tablet  1  . valsartan (DIOVAN) 160 MG tablet Take 1 tablet (160 mg total) by mouth daily.  90 tablet  3   No current facility-administered medications for this visit.    BP 124/60  Pulse 57  Ht 5' 10"  (1.778 m)  Wt 258 lb (117.028 kg)  BMI 37.02 kg/m2 General: NAD, obese.  Neck: Thick, no JVD, no thyromegaly or thyroid nodule.  Lungs: Clear to auscultation bilaterally with  normal respiratory effort. CV: Nondisplaced PMI.  Heart regular S1/S2, no S3/S4, no murmur.  Trace ankle edema.  Faint bilateral carotid bruits.  Normal pedal pulses.  Abdomen: Soft, nontender, no hepatosplenomegaly, no distention.  Neurologic: Alert and oriented x 3.  Psych: Normal affect. Extremities: No clubbing or cyanosis.   Assessment/Plan: 1. HTN: BP appears to be well-controlled on current regimen.  2. Hyperlipidemia: Very good LDL and LDL particle number when last checked.  Continue atorvastatin and Niacin.  3. Obesity: Weight is down 10 lbs since appt last year.  Continue to work on this.  4. RV dilation/dysfunction on echo: Mild.  This could be related to OSA with a degree of pulmonary HTN.  Continue to wear CPAP.  5. Carotid bruits: Will arrange for carotid dopplers.   Followup in 1 year.   [name] 05/11/2014

## 2014-05-14 ENCOUNTER — Ambulatory Visit (HOSPITAL_COMMUNITY): Payer: BC Managed Care – PPO | Attending: Cardiology | Admitting: Cardiology

## 2014-05-14 DIAGNOSIS — R0989 Other specified symptoms and signs involving the circulatory and respiratory systems: Secondary | ICD-10-CM | POA: Diagnosis not present

## 2014-05-14 DIAGNOSIS — I6529 Occlusion and stenosis of unspecified carotid artery: Secondary | ICD-10-CM

## 2014-05-14 DIAGNOSIS — E785 Hyperlipidemia, unspecified: Secondary | ICD-10-CM | POA: Diagnosis not present

## 2014-05-14 DIAGNOSIS — E119 Type 2 diabetes mellitus without complications: Secondary | ICD-10-CM | POA: Diagnosis not present

## 2014-05-14 DIAGNOSIS — Z87891 Personal history of nicotine dependence: Secondary | ICD-10-CM | POA: Diagnosis not present

## 2014-05-14 DIAGNOSIS — I10 Essential (primary) hypertension: Secondary | ICD-10-CM | POA: Diagnosis not present

## 2014-05-14 NOTE — Progress Notes (Signed)
Carotid duplex performed 

## 2014-05-20 ENCOUNTER — Telehealth: Payer: Self-pay | Admitting: Cardiology

## 2014-05-20 NOTE — Telephone Encounter (Signed)
Notified of carotid doppler results.

## 2014-05-20 NOTE — Telephone Encounter (Signed)
°  Patient would like results of ultrasound. Please call and advise.

## 2014-06-17 ENCOUNTER — Ambulatory Visit (INDEPENDENT_AMBULATORY_CARE_PROVIDER_SITE_OTHER): Payer: BC Managed Care – PPO

## 2014-06-17 DIAGNOSIS — Z23 Encounter for immunization: Secondary | ICD-10-CM

## 2014-07-02 ENCOUNTER — Telehealth: Payer: BC Managed Care – PPO | Admitting: Family Medicine

## 2014-07-04 ENCOUNTER — Other Ambulatory Visit: Payer: Self-pay | Admitting: *Deleted

## 2014-07-04 DIAGNOSIS — E119 Type 2 diabetes mellitus without complications: Secondary | ICD-10-CM

## 2014-07-04 DIAGNOSIS — E559 Vitamin D deficiency, unspecified: Secondary | ICD-10-CM

## 2014-07-04 DIAGNOSIS — N400 Enlarged prostate without lower urinary tract symptoms: Secondary | ICD-10-CM

## 2014-07-04 DIAGNOSIS — I10 Essential (primary) hypertension: Secondary | ICD-10-CM

## 2014-07-04 DIAGNOSIS — E785 Hyperlipidemia, unspecified: Secondary | ICD-10-CM

## 2014-07-08 ENCOUNTER — Other Ambulatory Visit (INDEPENDENT_AMBULATORY_CARE_PROVIDER_SITE_OTHER): Payer: BC Managed Care – PPO

## 2014-07-08 DIAGNOSIS — N400 Enlarged prostate without lower urinary tract symptoms: Secondary | ICD-10-CM

## 2014-07-08 DIAGNOSIS — E559 Vitamin D deficiency, unspecified: Secondary | ICD-10-CM

## 2014-07-08 DIAGNOSIS — I10 Essential (primary) hypertension: Secondary | ICD-10-CM

## 2014-07-08 DIAGNOSIS — E785 Hyperlipidemia, unspecified: Secondary | ICD-10-CM

## 2014-07-08 DIAGNOSIS — E119 Type 2 diabetes mellitus without complications: Secondary | ICD-10-CM

## 2014-07-08 LAB — POCT CBC
GRANULOCYTE PERCENT: 67.8 % (ref 37–80)
HCT, POC: 50.9 % (ref 43.5–53.7)
Hemoglobin: 16.4 g/dL (ref 14.1–18.1)
Lymph, poc: 2 (ref 0.6–3.4)
MCH, POC: 30.9 pg (ref 27–31.2)
MCHC: 32.2 g/dL (ref 31.8–35.4)
MCV: 96.2 fL (ref 80–97)
MPV: 9.3 fL (ref 0–99.8)
POC GRANULOCYTE: 5.2 (ref 2–6.9)
POC LYMPH %: 26.2 % (ref 10–50)
Platelet Count, POC: 137 10*3/uL — AB (ref 142–424)
RBC: 5.3 M/uL (ref 4.69–6.13)
RDW, POC: 16.2 %
WBC: 7.6 10*3/uL (ref 4.6–10.2)

## 2014-07-08 LAB — POCT GLYCOSYLATED HEMOGLOBIN (HGB A1C): Hemoglobin A1C: 5.3

## 2014-07-08 NOTE — Progress Notes (Signed)
Lab only 

## 2014-07-09 LAB — NMR, LIPOPROFILE
CHOLESTEROL: 107 mg/dL (ref 100–199)
HDL Cholesterol by NMR: 49 mg/dL (ref >39)
HDL PARTICLE NUMBER: 29.4 umol/L — AB (ref >=30.5)
LDL Particle Number: 562 nmol/L (ref <1000)
LDL SIZE: 21 nm (ref >20.5)
LDL-C: 49 mg/dL (ref 0–99)
SMALL LDL PARTICLE NUMBER: 204 nmol/L (ref <=527)
TRIGLYCERIDES BY NMR: 46 mg/dL (ref 0–149)

## 2014-07-09 LAB — BMP8+EGFR
BUN/Creatinine Ratio: 10 (ref 10–22)
BUN: 10 mg/dL (ref 8–27)
CHLORIDE: 100 mmol/L (ref 97–108)
CO2: 27 mmol/L (ref 18–29)
Calcium: 9.8 mg/dL (ref 8.6–10.2)
Creatinine, Ser: 1.02 mg/dL (ref 0.76–1.27)
GFR calc Af Amer: 89 mL/min/{1.73_m2} (ref >59)
GFR calc non Af Amer: 77 mL/min/{1.73_m2} (ref >59)
GLUCOSE: 67 mg/dL (ref 65–99)
POTASSIUM: 4.7 mmol/L (ref 3.5–5.2)
Sodium: 143 mmol/L (ref 134–144)

## 2014-07-09 LAB — HEPATIC FUNCTION PANEL
ALBUMIN: 4 g/dL (ref 3.6–4.8)
ALK PHOS: 61 IU/L (ref 39–117)
ALT: 37 IU/L (ref 0–44)
AST: 33 IU/L (ref 0–40)
BILIRUBIN DIRECT: 0.18 mg/dL (ref 0.00–0.40)
BILIRUBIN TOTAL: 0.5 mg/dL (ref 0.0–1.2)
TOTAL PROTEIN: 7 g/dL (ref 6.0–8.5)

## 2014-07-09 LAB — VITAMIN D 25 HYDROXY (VIT D DEFICIENCY, FRACTURES): Vit D, 25-Hydroxy: 50.7 ng/mL (ref 30.0–100.0)

## 2014-07-12 ENCOUNTER — Encounter: Payer: Self-pay | Admitting: Pulmonary Disease

## 2014-07-12 ENCOUNTER — Encounter: Payer: Self-pay | Admitting: Family Medicine

## 2014-07-14 ENCOUNTER — Telehealth: Payer: Self-pay | Admitting: *Deleted

## 2014-07-14 NOTE — Telephone Encounter (Signed)
Phone message created and will send to [facility]

## 2014-07-14 NOTE — Telephone Encounter (Signed)
Per pt email sent:  Message     Currently I'm a patient of Dr. Hardie Shackleton, oral surgeon, due to a TMJ issue. About 3 months ago my left jaw would pop so loud other people were hearing it and it would limit how far I could open my mouth (which is probably a good thing at times). Did this maybe 3 or 4 different times, but then stopped. Woke up the morning of October 23rd and my bite was limited and only painful if I tried to open wider than where it wanted to stop. Saw my dentist, Dr. Ahmed Prima in [place], and he had thought he felt the muscles in a contracted spasm (28th). Saw Dr. Hardie Shackleton the 9th and he felt no spasm nor was he able to hear, with a stethoscope, any noise in the joint yet my left side teeth do not touch and I'm limited to I think he said 92m when opening my mouth. He said 473mis normal. I have an MRI scheduled Tuesday morning (17th).         Question is has the CPAP mask ever caused this in any of your patients as there is a slight discomfort where the lower left side sits on my jaw now?    Please advise [facility]

## 2014-07-14 NOTE — Telephone Encounter (Signed)
I have never seen this with a cpap mask.  I have seen some patient's have sore gums in the front if mask too tight.

## 2014-07-14 NOTE — Telephone Encounter (Signed)
I have sent pt an email back. Will sign off this meesage

## 2014-07-21 ENCOUNTER — Encounter: Payer: Self-pay | Admitting: Family Medicine

## 2014-07-21 ENCOUNTER — Ambulatory Visit (INDEPENDENT_AMBULATORY_CARE_PROVIDER_SITE_OTHER): Payer: BC Managed Care – PPO | Admitting: Family Medicine

## 2014-07-21 VITALS — BP 121/63 | HR 64 | Temp 97.0°F | Ht 70.0 in | Wt 256.0 lb

## 2014-07-21 DIAGNOSIS — D696 Thrombocytopenia, unspecified: Secondary | ICD-10-CM

## 2014-07-21 DIAGNOSIS — E291 Testicular hypofunction: Secondary | ICD-10-CM

## 2014-07-21 DIAGNOSIS — E119 Type 2 diabetes mellitus without complications: Secondary | ICD-10-CM

## 2014-07-21 DIAGNOSIS — E349 Endocrine disorder, unspecified: Secondary | ICD-10-CM

## 2014-07-21 DIAGNOSIS — N400 Enlarged prostate without lower urinary tract symptoms: Secondary | ICD-10-CM

## 2014-07-21 DIAGNOSIS — E785 Hyperlipidemia, unspecified: Secondary | ICD-10-CM

## 2014-07-21 DIAGNOSIS — J301 Allergic rhinitis due to pollen: Secondary | ICD-10-CM

## 2014-07-21 DIAGNOSIS — E559 Vitamin D deficiency, unspecified: Secondary | ICD-10-CM

## 2014-07-21 DIAGNOSIS — I10 Essential (primary) hypertension: Secondary | ICD-10-CM

## 2014-07-21 LAB — POCT UA - MICROSCOPIC ONLY
CRYSTALS, UR, HPF, POC: NEGATIVE
Casts, Ur, LPF, POC: NEGATIVE
Mucus, UA: NEGATIVE
Yeast, UA: NEGATIVE

## 2014-07-21 LAB — POCT URINALYSIS DIPSTICK
Bilirubin, UA: NEGATIVE
Blood, UA: NEGATIVE
GLUCOSE UA: NEGATIVE
KETONES UA: NEGATIVE
Nitrite, UA: NEGATIVE
PROTEIN UA: NEGATIVE
SPEC GRAV UA: 1.01
UROBILINOGEN UA: NEGATIVE
pH, UA: 7

## 2014-07-21 NOTE — Patient Instructions (Addendum)
Medicare Annual Wellness Visit  [facility] and the medical providers at [facility] strive to bring you the best medical care.  In doing so we not only want to address your current medical conditions and concerns but also to detect new conditions early and prevent illness, disease and health-related problems.    Medicare offers a yearly Wellness Visit which allows our clinical staff to assess your need for preventative services including immunizations, lifestyle education, counseling to decrease risk of preventable diseases and screening for fall risk and other medical concerns.    This visit is provided free of charge (no copay) for all Medicare recipients. The clinical pharmacists at [facility] have begun to conduct these Wellness Visits which will also include a thorough review of all your medications.    As you primary medical provider recommend that you make an appointment for your Annual Wellness Visit if you have not done so already this year.  You may set up this appointment before you leave today or you may call back ([phone]) and schedule an appointment.  Please make sure when you call that you mention that you are scheduling your Annual Wellness Visit with the clinical pharmacist so that the appointment may be made for the proper length of time.     Continue current medications. Continue good therapeutic lifestyle changes which include good diet and exercise. Fall precautions discussed with patient. If an FOBT was given today- please return it to our front desk. If you are over 29 years old - you may need Prevnar 62 or the adult Pneumonia vaccine.  Flu Shots will be available at our office starting mid- September. Please call and schedule a FLU CLINIC APPOINTMENT.   We will call you with the results of today's lab work as soon as those results are available Continue good blood sugar control and continue to monitor  your feet regularly Use Flonase nasal spray more regularly especially this time of the year. Use saline nose spray during the day as needed for nasal congestion

## 2014-07-21 NOTE — Addendum Note (Signed)
Addended by: Zannie Cove on: 07/21/2014 09:20 AM   Modules accepted: Orders

## 2014-07-21 NOTE — Progress Notes (Signed)
Subjective:    Patient ID: Tony Cantu, male    DOB: 09-26-1950, 63 y.o.   MRN: [id number]  HPI Pt here for follow up and management of chronic medical problems. The patient's main complaints today are jaw pain. He has an appointment to see an oral surgeon soon. He has lab work which we will review with him today. He also needs a prostate exam today. Recent lab work that was done and will be reviewed with him during the visit today. The only significant thing that should be pointed out is that his platelet count was low and he will be told of this.        Patient Active Problem List   Diagnosis Date Noted  . Bilateral carotid bruits 05/11/2014  . Vitamin D deficiency 09/17/2013  . BPH (benign prostatic hyperplasia) 05/22/2013  . 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 Diabetes type 2, controlled 01/10/2013  . Essential hypertension, benign 01/10/2013  . OSA (obstructive sleep apnea) 05/16/2012  . Edema 02/21/2012  . At risk for coronary artery disease 03/20/2011  . Obesity 03/20/2011  . WEIGHT GAIN, ABNORMAL 11/09/2010  . Hyperlipidemia 06/22/2010  . ALLERGIC RHINITIS 06/22/2010  . ASTHMA 06/22/2010  . COUGH 06/22/2010   Outpatient Encounter Prescriptions as of 07/21/2014  Medication Sig  . aspirin 325 MG EC tablet Take 325 mg by mouth daily.  Marland Kitchen atorvastatin (LIPITOR) 40 MG tablet Take 1 tablet (40 mg total) by mouth daily.  Marland Kitchen b complex vitamins tablet Take 1 tablet by mouth daily.    . B-D ULTRA-FINE 33 LANCETS MISC Check Blood sugar 8 times daily and PRN.DX 250.0  . Calcium Carbonate (CALTRATE 600 PO) Take by mouth daily.    . Cholecalciferol (VITAMIN D3) 2000 UNITS capsule Take 2,000 Units by mouth daily.    . fexofenadine (ALLEGRA) 180 MG tablet Take 180 mg by mouth daily.  . furosemide (LASIX) 20 MG tablet Take 1.5 tablets (30 mg total) by mouth daily.  . Glucosamine-Chondroit-Vit C-Mn (GLUCOSAMINE 1500 COMPLEX) CAPS Take by mouth daily.    Marland Kitchen glucose blood (ONE  TOUCH ULTRA TEST) test strip chesk BS q 2 hours PRN.dx.250.0  . insulin glargine (LANTUS) 100 UNIT/ML injection Inject 0.75 mLs (75 Units total) into the skin 2 (two) times daily. As directed  . insulin lispro (HUMALOG) 100 UNIT/ML injection Inject 0.2-0.5 mLs (20-50 Units total) into the skin 3 (three) times daily before meals. Sliding scale  . Insulin Syringe-Needle U-100 (BD INSULIN SYRINGE ULTRAFINE) 31G X 15/64" 1 ML MISC Inject 1 Syringe into the skin 2 (two) times daily. Dx.250.0  . Insulin Syringe-Needle U-100 (BD INSULIN SYRINGE ULTRAFINE) 31G X 5/16" 0.5 ML MISC Inject 8 Syringes into the skin every 2 (two) hours. Dx.250.0  . montelukast (SINGULAIR) 10 MG tablet TAKE 1 TABLET AT BEDTIME  . MULTIPLE VITAMIN PO Take by mouth daily.  . niacin (NIASPAN) 500 MG CR tablet Take 1 tablet (500 mg total) by mouth daily.  . pantoprazole (PROTONIX) 40 MG tablet TAKE 1 TABLET DAILY  . ranitidine (ZANTAC) 75 MG tablet Take 75 mg by mouth at bedtime.  . Testosterone (AXIRON) 30 MG/ACT SOLN Place 1 application onto the skin daily. Place 1 application under each axilla daily  . traMADol (ULTRAM) 50 MG tablet Take 1 tablet (50 mg total) by mouth every 6 (six) hours as needed.  . valsartan (DIOVAN) 160 MG tablet Take 1 tablet (160 mg total) by mouth daily.  . [DISCONTINUED] loratadine (CLARITIN) 10 MG tablet  Take 10 mg by mouth daily.    Review of Systems  Constitutional: Negative.   HENT: Negative.   Eyes: Negative.   Respiratory: Negative.   Cardiovascular: Negative.   Gastrointestinal: Negative.   Endocrine: Negative.   Genitourinary: Negative.   Musculoskeletal: Positive for arthralgias (jaw pain - going to oral surgeon ).  Skin: Negative.   Allergic/Immunologic: Negative.   Neurological: Negative.   Hematological: Negative.   Psychiatric/Behavioral: Negative.        Objective:   Physical Exam  Constitutional: He is oriented to person, place, and time. He appears well-developed and  well-nourished. No distress.  The patient remains somewhat overweight but is doing better with weight control as his weight is down a couple of pounds.  HENT:  Head: Normocephalic and atraumatic.  Right Ear: External ear normal.  Left Ear: External ear normal.  Mouth/Throat: Oropharynx is clear and moist. No oropharyngeal exudate.  Nasal congestion bilaterally  Eyes: Conjunctivae and EOM are normal. Pupils are equal, round, and reactive to light. Right eye exhibits no discharge. Left eye exhibits no discharge. No scleral icterus.  Neck: Normal range of motion. Neck supple. No thyromegaly present.  Carotid bruits audible bilaterally, patient had recent carotid Dopplers that were 40% stenosis. And patient was aware of this.  Cardiovascular: Normal rate, regular rhythm, normal heart sounds and intact distal pulses.  Exam reveals no gallop and no friction rub.   No murmur heard. At 60/m  Pulmonary/Chest: Effort normal and breath sounds normal. No respiratory distress. He has no wheezes. He has no rales. He exhibits no tenderness.  Abdominal: Soft. Bowel sounds are normal. He exhibits no mass. There is no tenderness. There is no rebound and no guarding.  Genitourinary: Rectum normal and penis normal.  The prostate was enlarged, but soft and smooth without lumps or masses. There are no rectal masses. The left testicle was small right testicle felt normal there were no hernias bilaterally and the external genitalia appeared normal otherwise.  Musculoskeletal: Normal range of motion. He exhibits no edema or tenderness.  Lymphadenopathy:    He has no cervical adenopathy.  Neurological: He is alert and oriented to person, place, and time. He has normal reflexes. No cranial nerve deficit.  Skin: Skin is warm and dry. No rash noted.  Psychiatric: He has a normal mood and affect. His behavior is normal. Judgment and thought content normal.  Nursing note and vitals reviewed.   BP 121/63 mmHg  Pulse 64   Temp(Src) 97 F (36.1 C) (Oral)  Ht 5' 10"  (1.778 m)  Wt 256 lb (116.121 kg)  BMI 36.73 kg/m2       Assessment & Plan:  1. Diabetes type 2, controlled  2. BPH (benign prostatic hyperplasia) - PSA, total and free - Testosterone,Free and Total  3. Testosterone deficiency - PSA, total and free - Testosterone,Free and Total  4. Vitamin D deficiency  5. Thrombocytopenia  6. Essential hypertension, benign  7. Hyperlipidemia  8. Allergic rhinitis due to pollen   Patient Instructions                       Medicare Annual Wellness Visit  [facility] and the medical providers at [facility] strive to bring you the best medical care.  In doing so we not only want to address your current medical conditions and concerns but also to detect new conditions early and prevent illness, disease and health-related problems.    Medicare offers a  yearly Wellness Visit which allows our clinical staff to assess your need for preventative services including immunizations, lifestyle education, counseling to decrease risk of preventable diseases and screening for fall risk and other medical concerns.    This visit is provided free of charge (no copay) for all Medicare recipients. The clinical pharmacists at [facility] have begun to conduct these Wellness Visits which will also include a thorough review of all your medications.    As you primary medical provider recommend that you make an appointment for your Annual Wellness Visit if you have not done so already this year.  You may set up this appointment before you leave today or you may call back ([phone]) and schedule an appointment.  Please make sure when you call that you mention that you are scheduling your Annual Wellness Visit with the clinical pharmacist so that the appointment may be made for the proper length of time.     Continue current medications. Continue good therapeutic lifestyle  changes which include good diet and exercise. Fall precautions discussed with patient. If an FOBT was given today- please return it to our front desk. If you are over 55 years old - you may need Prevnar 71 or the adult Pneumonia vaccine.  Flu Shots will be available at our office starting mid- September. Please call and schedule a FLU CLINIC APPOINTMENT.   We will call you with the results of today's lab work as soon as those results are available Continue good blood sugar control and continue to monitor your feet regularly Use Flonase nasal spray more regularly especially this time of the year. Use saline nose spray during the day as needed for nasal congestion   Arrie Senate MD

## 2014-07-22 ENCOUNTER — Encounter: Payer: Self-pay | Admitting: Family Medicine

## 2014-07-22 LAB — TESTOSTERONE,FREE AND TOTAL
TESTOSTERONE FREE: 11.5 pg/mL (ref 6.6–18.1)
TESTOSTERONE: 790 ng/dL (ref 348–1197)

## 2014-07-22 LAB — PSA, TOTAL AND FREE
PSA, Free Pct: 26.9 %
PSA, Free: 0.43 ng/mL
PSA: 1.6 ng/mL (ref 0.0–4.0)

## 2014-07-22 LAB — URINE CULTURE

## 2014-07-29 ENCOUNTER — Encounter: Payer: Self-pay | Admitting: Family Medicine

## 2014-07-29 ENCOUNTER — Encounter: Payer: Self-pay | Admitting: Pulmonary Disease

## 2014-08-04 ENCOUNTER — Encounter: Payer: Self-pay | Admitting: Family Medicine

## 2014-08-11 ENCOUNTER — Telehealth: Payer: Self-pay | Admitting: *Deleted

## 2014-08-11 ENCOUNTER — Encounter: Payer: Self-pay | Admitting: Pulmonary Disease

## 2014-08-11 NOTE — Telephone Encounter (Signed)
Per pt email sent 08/11/14.   Dr. Hardie Shackleton was sent pictures of a chin strap used to hold my mouth shut and pillows or puffs for air delivery. Due to the fact the chin strap still travels over the jaw line he said not wear it as it could put the disk out of line again. Tony Cantu at [facility] said she thought that was the last or only thing left that they had. Are there any other CPAP suppliers you know of that may have something that doesn't apply pressure on the jawline like what I was using.   After just a couple of days not wearing the mask and using the bite guard the jaw started opening further and I'm now back at 19m's with a normal of 40 per Dr. RHardie Shackleton Also using a Therabite device exercising the jaw   Please advise [facility]

## 2014-08-11 NOTE — Telephone Encounter (Signed)
Pt email sent to pt.

## 2014-08-11 NOTE — Telephone Encounter (Signed)
I do not know of any chin strap that doesn't cross the jaw line. There is a full face mask that is pretty neat called AMARA VIEW by [employer].  Have him look at this and see what he thinks.

## 2014-08-29 DIAGNOSIS — K7460 Unspecified cirrhosis of liver: Secondary | ICD-10-CM

## 2014-08-29 HISTORY — DX: Unspecified cirrhosis of liver: K74.60

## 2014-10-19 ENCOUNTER — Other Ambulatory Visit: Payer: Self-pay | Admitting: Nurse Practitioner

## 2014-10-29 ENCOUNTER — Encounter: Payer: Self-pay | Admitting: Family Medicine

## 2014-11-05 ENCOUNTER — Encounter: Payer: Self-pay | Admitting: Family Medicine

## 2015-01-02 ENCOUNTER — Ambulatory Visit: Payer: BC Managed Care – PPO | Admitting: Pulmonary Disease

## 2015-01-04 ENCOUNTER — Encounter: Payer: Self-pay | Admitting: Family Medicine

## 2015-01-07 ENCOUNTER — Encounter: Payer: Self-pay | Admitting: Family Medicine

## 2015-01-09 ENCOUNTER — Ambulatory Visit (INDEPENDENT_AMBULATORY_CARE_PROVIDER_SITE_OTHER): Payer: BLUE CROSS/BLUE SHIELD | Admitting: Pulmonary Disease

## 2015-01-09 ENCOUNTER — Encounter: Payer: Self-pay | Admitting: Pulmonary Disease

## 2015-01-09 VITALS — BP 124/70 | HR 64 | Temp 97.2°F | Ht 70.0 in | Wt 247.0 lb

## 2015-01-09 DIAGNOSIS — G4733 Obstructive sleep apnea (adult) (pediatric): Secondary | ICD-10-CM | POA: Diagnosis not present

## 2015-01-09 NOTE — Assessment & Plan Note (Signed)
The patient is wearing C Pap compliantly, and seems to be doing well with his current mask. He is not resting quite as well as in the past, and we will get a download off his device to check on his settings and control of his apnea. He has continued to lose weight, and I have congratulated him on this. He is very unhappy with his current home care company, and we'll make a change.

## 2015-01-09 NOTE — Patient Instructions (Signed)
Will get you changed to a new home care company Keep working on weight loss.  You are doing well. Please bring your SD card by the office and have [place] download.  I will call you once I get the report. followup with Dr. Halford Chessman in one year.

## 2015-01-09 NOTE — Progress Notes (Signed)
   Subjective:    Patient ID: Tony Cantu, male    DOB: Nov 08, 1950, 64 y.o.   MRN: [id number]  HPI The patient comes in today for follow-up of his obstructive sleep apnea. He is wearing C Pap compliantly, and feels that he sleeps fairly well with the device. He is not resting as well as when he first started C Pap, and I think we need to get a download to make sure things are going well. He has actually lost 20 more pounds since the last visit, and perhaps his pressure is too high. He has found a mass that seems to fit well for him, and I've asked him to continue with this.   Review of Systems  Constitutional: Negative for fever and unexpected weight change.  HENT: Negative for congestion, dental problem, ear pain, nosebleeds, postnasal drip, rhinorrhea, sinus pressure, sneezing, sore throat and trouble swallowing.   Eyes: Negative for redness and itching.  Respiratory: Negative for cough, chest tightness, shortness of breath and wheezing.   Cardiovascular: Negative for palpitations and leg swelling.  Gastrointestinal: Negative for nausea and vomiting.  Genitourinary: Negative for dysuria.  Musculoskeletal: Negative for joint swelling.  Skin: Negative for rash.  Neurological: Negative for headaches.  Hematological: Does not bruise/bleed easily.  Psychiatric/Behavioral: Negative for dysphoric mood. The patient is not nervous/anxious.        Objective:   Physical Exam Overweight male in no acute distress Nose without purulence or discharge noted Neck without lymphadenopathy or thyromegaly No skin breakdown or pressure necrosis from the C Pap mask Lower extremities without significant edema, no cyanosis Alert and oriented, moves all 4 extremities.        Assessment & Plan:

## 2015-01-11 ENCOUNTER — Other Ambulatory Visit: Payer: Self-pay | Admitting: Family Medicine

## 2015-01-12 ENCOUNTER — Telehealth: Payer: Self-pay | Admitting: Pulmonary Disease

## 2015-01-12 ENCOUNTER — Other Ambulatory Visit (INDEPENDENT_AMBULATORY_CARE_PROVIDER_SITE_OTHER): Payer: BLUE CROSS/BLUE SHIELD

## 2015-01-12 DIAGNOSIS — E291 Testicular hypofunction: Secondary | ICD-10-CM | POA: Diagnosis not present

## 2015-01-12 DIAGNOSIS — G4733 Obstructive sleep apnea (adult) (pediatric): Secondary | ICD-10-CM

## 2015-01-12 DIAGNOSIS — E785 Hyperlipidemia, unspecified: Secondary | ICD-10-CM

## 2015-01-12 DIAGNOSIS — D696 Thrombocytopenia, unspecified: Secondary | ICD-10-CM

## 2015-01-12 DIAGNOSIS — E108 Type 1 diabetes mellitus with unspecified complications: Secondary | ICD-10-CM

## 2015-01-12 DIAGNOSIS — E559 Vitamin D deficiency, unspecified: Secondary | ICD-10-CM | POA: Diagnosis not present

## 2015-01-12 DIAGNOSIS — N400 Enlarged prostate without lower urinary tract symptoms: Secondary | ICD-10-CM

## 2015-01-12 DIAGNOSIS — I10 Essential (primary) hypertension: Secondary | ICD-10-CM

## 2015-01-12 DIAGNOSIS — E349 Endocrine disorder, unspecified: Secondary | ICD-10-CM

## 2015-01-12 LAB — POCT CBC
GRANULOCYTE PERCENT: 71.2 % (ref 37–80)
HCT, POC: 55.2 % — AB (ref 43.5–53.7)
HEMOGLOBIN: 17.5 g/dL (ref 14.1–18.1)
Lymph, poc: 1.9 (ref 0.6–3.4)
MCH, POC: 32.4 pg — AB (ref 27–31.2)
MCHC: 31.8 g/dL (ref 31.8–35.4)
MCV: 102 fL — AB (ref 80–97)
MPV: 9.4 fL (ref 0–99.8)
POC Granulocyte: 5.4 (ref 2–6.9)
POC LYMPH %: 24.9 % (ref 10–50)
Platelet Count, POC: 153 10*3/uL (ref 142–424)
RBC: 5.41 M/uL (ref 4.69–6.13)
RDW, POC: 14.1 %
WBC: 7.6 10*3/uL (ref 4.6–10.2)

## 2015-01-12 NOTE — Telephone Encounter (Signed)
Let pt know that his download shows that his pressure is set on auto 5-15.  He is pushing right up against the high pressure for a lot of the night.  Although his sleep apnea overall is controlled, he does have some breakthru apnea.  I would recommend changing his pressure range 5-20 so the machine has more range to work with. If he is ok with that, send the order. Thanks.

## 2015-01-12 NOTE — Telephone Encounter (Signed)
SD card has been downloaded. Will place download in KC's green folder. SD card will be placed in today's mail to mail to the patient.  [place] - please advise on download.

## 2015-01-13 LAB — NMR, LIPOPROFILE
CHOLESTEROL: 103 mg/dL (ref 100–199)
HDL CHOLESTEROL BY NMR: 53 mg/dL (ref >39)
HDL PARTICLE NUMBER: 26.9 umol/L — AB (ref >=30.5)
LDL Particle Number: 352 nmol/L (ref <1000)
LDL Size: 21.5 nm (ref >20.5)
LDL-C: 42 mg/dL (ref 0–99)
LP-IR Score: <25 (ref <=45)
Small LDL Particle Number: 131 nmol/L (ref <=527)
TRIGLYCERIDES BY NMR: 39 mg/dL (ref 0–149)

## 2015-01-13 LAB — TESTOSTERONE,FREE AND TOTAL
TESTOSTERONE: 425 ng/dL (ref 348–1197)
Testosterone, Free: 7.6 pg/mL (ref 6.6–18.1)

## 2015-01-13 LAB — PSA, TOTAL AND FREE
PSA, Free Pct: 32.1 %
PSA, Free: 0.45 ng/mL
Prostate Specific Ag, Serum: 1.4 ng/mL (ref 0.0–4.0)

## 2015-01-13 LAB — HEPATIC FUNCTION PANEL
ALK PHOS: 66 IU/L (ref 39–117)
ALT: 45 IU/L — ABNORMAL HIGH (ref 0–44)
AST: 39 IU/L (ref 0–40)
Albumin: 4.6 g/dL (ref 3.6–4.8)
Bilirubin Total: 0.7 mg/dL (ref 0.0–1.2)
Bilirubin, Direct: 0.25 mg/dL (ref 0.00–0.40)
Total Protein: 7.7 g/dL (ref 6.0–8.5)

## 2015-01-13 LAB — BMP8+EGFR
BUN / CREAT RATIO: 16 (ref 10–22)
BUN: 16 mg/dL (ref 8–27)
CO2: 28 mmol/L (ref 18–29)
CREATININE: 0.97 mg/dL (ref 0.76–1.27)
Calcium: 10 mg/dL (ref 8.6–10.2)
Chloride: 99 mmol/L (ref 97–108)
GFR, EST AFRICAN AMERICAN: 94 mL/min/{1.73_m2} (ref >59)
GFR, EST NON AFRICAN AMERICAN: 82 mL/min/{1.73_m2} (ref >59)
Glucose: 44 mg/dL — ABNORMAL LOW (ref 65–99)
Potassium: 4.9 mmol/L (ref 3.5–5.2)
SODIUM: 144 mmol/L (ref 134–144)

## 2015-01-13 LAB — VITAMIN D 25 HYDROXY (VIT D DEFICIENCY, FRACTURES): Vit D, 25-Hydroxy: 42.5 ng/mL (ref 30.0–100.0)

## 2015-01-13 NOTE — Telephone Encounter (Signed)
Pt is aware of download results. He is fine with pressure. Order has been placed for change in pressure. Nothing further was needed.

## 2015-01-15 ENCOUNTER — Telehealth: Payer: Self-pay | Admitting: *Deleted

## 2015-01-15 NOTE — Telephone Encounter (Signed)
-----   Message from Chipper Herb, MD sent at 01/13/2015  1:57 PM EDT ----- The blood sugar is decreased at 44. The electrolytes including potassium are within normal limits and the most important tests, the creatinine is within normal limits. 1 liver function test is elevated by 1 point and this is okay and we will continue to monitor this.  The PSA is 1.4 and the previous PSA after looking it up from 1 year ago was 1.2 and this means that the rate of rise is within normal limits.----- April please check into the PSA reporting for me having to look up the previous years results Testosterone levels were all within normal limits with the total testosterone and free direct testosterone.----We will discuss the testosterone dosing when the patient comes in for a visit All cholesterol numbers with advanced lipid testing are excellent and at goal except the good cholesterol or the HDL particle number remains low----the patient should continue with his atorvastatin and Niaspan. Also with aggressive therapeutic lifestyle changes The vitamin D level is good and within normal limits and patient should continue with current treatment

## 2015-01-15 NOTE — Telephone Encounter (Signed)
Pt notified of results Verbalizes understanding 

## 2015-01-15 NOTE — Telephone Encounter (Signed)
Left message, please call to discuss results.

## 2015-01-19 ENCOUNTER — Encounter: Payer: Self-pay | Admitting: Family Medicine

## 2015-01-19 ENCOUNTER — Ambulatory Visit (INDEPENDENT_AMBULATORY_CARE_PROVIDER_SITE_OTHER): Payer: BLUE CROSS/BLUE SHIELD | Admitting: Family Medicine

## 2015-01-19 VITALS — BP 116/65 | HR 64 | Temp 97.4°F | Ht 70.0 in | Wt 246.0 lb

## 2015-01-19 DIAGNOSIS — I10 Essential (primary) hypertension: Secondary | ICD-10-CM | POA: Diagnosis not present

## 2015-01-19 DIAGNOSIS — E349 Endocrine disorder, unspecified: Secondary | ICD-10-CM

## 2015-01-19 DIAGNOSIS — E559 Vitamin D deficiency, unspecified: Secondary | ICD-10-CM | POA: Diagnosis not present

## 2015-01-19 DIAGNOSIS — F4323 Adjustment disorder with mixed anxiety and depressed mood: Secondary | ICD-10-CM | POA: Diagnosis not present

## 2015-01-19 DIAGNOSIS — E291 Testicular hypofunction: Secondary | ICD-10-CM

## 2015-01-19 DIAGNOSIS — D696 Thrombocytopenia, unspecified: Secondary | ICD-10-CM | POA: Diagnosis not present

## 2015-01-19 DIAGNOSIS — N400 Enlarged prostate without lower urinary tract symptoms: Secondary | ICD-10-CM | POA: Diagnosis not present

## 2015-01-19 DIAGNOSIS — E785 Hyperlipidemia, unspecified: Secondary | ICD-10-CM | POA: Diagnosis not present

## 2015-01-19 DIAGNOSIS — E108 Type 1 diabetes mellitus with unspecified complications: Secondary | ICD-10-CM | POA: Diagnosis not present

## 2015-01-19 LAB — POCT UA - MICROSCOPIC ONLY
CRYSTALS, UR, HPF, POC: NEGATIVE
Casts, Ur, LPF, POC: NEGATIVE
Mucus, UA: NEGATIVE
YEAST UA: NEGATIVE

## 2015-01-19 LAB — POCT URINALYSIS DIPSTICK
BILIRUBIN UA: NEGATIVE
Glucose, UA: NEGATIVE
Ketones, UA: NEGATIVE
Nitrite, UA: NEGATIVE
PH UA: 7.5
SPEC GRAV UA: 1.01
Urobilinogen, UA: NEGATIVE

## 2015-01-19 LAB — POCT GLYCOSYLATED HEMOGLOBIN (HGB A1C): Hemoglobin A1C: 4.8

## 2015-01-19 LAB — POCT UA - MICROALBUMIN: Microalbumin Ur, POC: 20 mg/L

## 2015-01-19 MED ORDER — "INSULIN SYRINGE-NEEDLE U-100 31G X 15/64"" 1 ML MISC": 1.0000 | Freq: Two times a day (BID) | Status: DC

## 2015-01-19 MED ORDER — GLUCOSE BLOOD VI STRP: ORAL_STRIP | Status: DC

## 2015-01-19 MED ORDER — INSULIN LISPRO 100 UNIT/ML ~~LOC~~ SOLN: 20.0000 [IU] | Freq: Three times a day (TID) | SUBCUTANEOUS | Status: DC

## 2015-01-19 MED ORDER — "INSULIN SYRINGE-NEEDLE U-100 31G X 5/16"" 0.5 ML MISC": Status: DC

## 2015-01-19 MED ORDER — INSULIN GLARGINE 100 UNIT/ML ~~LOC~~ SOLN: 75.0000 [IU] | Freq: Two times a day (BID) | SUBCUTANEOUS | Status: DC

## 2015-01-19 MED ORDER — BD LANCET ULTRAFINE 33G MISC: Status: DC

## 2015-01-19 NOTE — Addendum Note (Signed)
Addended by: Selmer Dominion on: 01/19/2015 09:30 AM   Modules accepted: Orders

## 2015-01-19 NOTE — Progress Notes (Signed)
Subjective:    Patient ID: Tony Cantu, male    DOB: [birth year], 64 y.o.   MRN: [id number]  HPI Pt here for situational anxiety and discussion of health maintainence. He is taking medications regularly. The patient is doing well. He is having some left side pain. All of his labs will be reviewed with him today during the visit. The patient came in for health maintenance visit today but this changed as the patient is doing with a lot of stress with his son former wife and current wife. We decided to defer the physical exam and the things were going to be done during this visit to a visit one month out. We spent a considerable amount of time discussing the issues with his son and and  Pregnancy.      Patient Active Problem List   Diagnosis Date Noted  . Thrombocytopenia 07/21/2014  . Bilateral carotid bruits 05/11/2014  . Vitamin D deficiency 09/17/2013  . BPH (benign prostatic hyperplasia) 05/22/2013  . 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 Diabetes type 2, controlled 01/10/2013  . Essential hypertension, benign 01/10/2013  . OSA (obstructive sleep apnea) 05/16/2012  . Edema 02/21/2012  . At risk for coronary artery disease 03/20/2011  . Obesity 03/20/2011  . WEIGHT GAIN, ABNORMAL 11/09/2010  . Hyperlipidemia 06/22/2010  . ALLERGIC RHINITIS 06/22/2010  . ASTHMA 06/22/2010  . COUGH 06/22/2010   Outpatient Encounter Prescriptions as of 01/19/2015  Medication Sig  . aspirin 325 MG EC tablet Take 325 mg by mouth daily.  Marland Kitchen atorvastatin (LIPITOR) 40 MG tablet Take 1 tablet (40 mg total) by mouth daily.  Marland Kitchen b complex vitamins tablet Take 1 tablet by mouth daily.    . B-D ULTRA-FINE 33 LANCETS MISC Check Blood sugar 8 times daily and PRN.DX 250.0  . BD INSULIN SYRINGE ULTRAFINE 31G X 5/16" 0.5 ML MISC USE TO INJECT 8 TO 10 TIMES DAILY  . Calcium Carbonate (CALTRATE 600 PO) Take by mouth daily.    . Cholecalciferol (VITAMIN D3) 2000 UNITS capsule Take 2,000 Units by mouth daily.      Marland Kitchen EPIPEN 2-PAK 0.3 MG/0.3ML SOAJ injection   . fexofenadine (ALLEGRA) 180 MG tablet Take 180 mg by mouth daily.  . Flaxseed, Linseed, (FLAX SEED OIL PO) Take 1 capsule by mouth daily.  . furosemide (LASIX) 20 MG tablet Take 1.5 tablets (30 mg total) by mouth daily.  . Glucosamine-Chondroit-Vit C-Mn (GLUCOSAMINE 1500 COMPLEX) CAPS Take by mouth daily.    Marland Kitchen glucose blood (ONE TOUCH ULTRA TEST) test strip chesk BS q 2 hours PRN.dx.250.0  . insulin glargine (LANTUS) 100 UNIT/ML injection Inject 0.75 mLs (75 Units total) into the skin 2 (two) times daily. As directed  . insulin lispro (HUMALOG) 100 UNIT/ML injection Inject 0.2-0.5 mLs (20-50 Units total) into the skin 3 (three) times daily before meals. Sliding scale  . Insulin Syringe-Needle U-100 (BD INSULIN SYRINGE ULTRAFINE) 31G X 15/64" 1 ML MISC Inject 1 Syringe into the skin 2 (two) times daily. Dx.250.0  . montelukast (SINGULAIR) 10 MG tablet TAKE 1 TABLET AT BEDTIME  . MULTIPLE VITAMIN PO Take by mouth daily.  . niacin (NIASPAN) 500 MG CR tablet Take 1 tablet (500 mg total) by mouth daily.  . pantoprazole (PROTONIX) 40 MG tablet TAKE 1 TABLET DAILY  . ranitidine (ZANTAC) 75 MG tablet Take 75 mg by mouth at bedtime.  . Testosterone (AXIRON) 30 MG/ACT SOLN Place 1 application onto the skin daily. Place 1 application under each axilla daily  .  traMADol (ULTRAM) 50 MG tablet Take 1 tablet (50 mg total) by mouth every 6 (six) hours as needed.  . valsartan (DIOVAN) 160 MG tablet Take 1 tablet (160 mg total) by mouth daily.  . [DISCONTINUED] amoxicillin (AMOXIL) 500 MG capsule 3 (three) times daily.   No facility-administered encounter medications on file as of 01/19/2015.      Review of Systems  Constitutional: Negative.   HENT: Negative.   Eyes: Negative.   Respiratory: Negative.   Cardiovascular: Negative.   Gastrointestinal: Negative.   Endocrine: Negative.   Genitourinary: Negative.   Musculoskeletal: Negative.        Left side  pain  Skin: Negative.   Allergic/Immunologic: Negative.   Neurological: Negative.   Hematological: Negative.   Psychiatric/Behavioral: Negative.        Objective:   Physical Exam BP 116/65 mmHg  Pulse 64  Temp(Src) 97.4 F (36.3 C) (Oral)  Ht 5' 10"  (1.778 m)  Wt 246 lb (111.585 kg)  BMI 35.30 kg/m2 The most recent lab work was reviewed reviewed with the patient and questions were answered regarding these was area his PSA only went up 2/10 of a point. His hemoglobin however and hematocrit did go up and had been rising over the past year. This is most likely a result of his testosterone treatment and he will have to reduce his treatment to 1 treatment to one axilla daily. The majority of the visit was spent discussing issues between his son his daughter-in-law and their having a new child. This is a complex issue as his former wife is also involved and his current wife is involved in dealing with his son. We decided to defer the physical exam until one month out at which time we will repeat the CBC to confirm that the hemoglobin is diminishing compared to what it was this time.       Assessment & Plan:  1. BPH (benign prostatic hyperplasia) -This will be deferred until the next visit - POCT UA - Microscopic Only - POCT urinalysis dipstick  2. Testosterone deficiency -The patient will continue his Axiron or testosterone replacement but will reduce this to 1 application daily - POCT UA - Microscopic Only - POCT urinalysis dipstick  3. Vitamin D deficiency -The vitamin D level was good and he will continue with current treatment  4. Thrombocytopenia -The platelet count was good today and there been no problems with bleeding  5. Essential hypertension, benign -The blood pressure was good today and he will continue with current treatment - POCT UA - Microalbumin  6. Hyperlipidemia -Cholesterol numbers were excellent except for the HDL and he will try some flaxseed to see if  this may help bring up the good cholesterol. Also informed him that he needed to do more with exercise as well as diet  7. Diabetes type 2, controlled -His hemoglobin A1c was 4.8% and this was excellent and he has always done a good job of keeping his blood sugar under control - POCT UA - Microalbumin - POCT glycosylated hemoglobin (Hb A1C)  8. Situational mixed anxiety and depressive disorder -He does not need any medicine with as he just needed to talk about the things are going along with his son former wife and current wife and he seems to have a good feeling about where he needs to move forward and how to move forward with the issues that he is being confronted with  Meds ordered this encounter  Medications  . Flaxseed, Linseed, (FLAX SEED  OIL PO)    Sig: Take 1 capsule by mouth daily.  . insulin lispro (HUMALOG) 100 UNIT/ML injection    Sig: Inject 0.2-0.5 mLs (20-50 Units total) into the skin 3 (three) times daily before meals. Sliding scale    Dispense:  12 vial    Refill:  3  . insulin glargine (LANTUS) 100 UNIT/ML injection    Sig: Inject 0.75 mLs (75 Units total) into the skin 2 (two) times daily. As directed    Dispense:  12 vial    Refill:  3  . B-D ULTRA-FINE 33 LANCETS MISC    Sig: Check Blood sugar 8 times daily and PRN.DX E11.9    Dispense:  800 each    Refill:  3  . Insulin Syringe-Needle U-100 (BD INSULIN SYRINGE ULTRAFINE) 31G X 5/16" 0.5 ML MISC    Sig: USE TO INJECT 8 TO 10 TIMES DAILY, dx E11.9    Dispense:  800 each    Refill:  3  . Insulin Syringe-Needle U-100 (BD INSULIN SYRINGE ULTRAFINE) 31G X 15/64" 1 ML MISC    Sig: Inject 1 Syringe into the skin 2 (two) times daily. Dx.E11.9    Dispense:  180 each    Refill:  3  . glucose blood (ONE TOUCH ULTRA TEST) test strip    Sig: chesk BS q 2 hours PRN.dx.E11.9    Dispense:  800 each    Refill:  3   Patient Instructions                       Medicare Annual Wellness Visit  [facility] and the medical  providers at [facility] strive to bring you the best medical care.  In doing so we not only want to address your current medical conditions and concerns but also to detect new conditions early and prevent illness, disease and health-related problems.    Medicare offers a yearly Wellness Visit which allows our clinical staff to assess your need for preventative services including immunizations, lifestyle education, counseling to decrease risk of preventable diseases and screening for fall risk and other medical concerns.    This visit is provided free of charge (no copay) for all Medicare recipients. The clinical pharmacists at [facility] have begun to conduct these Wellness Visits which will also include a thorough review of all your medications.    As you primary medical provider recommend that you make an appointment for your Annual Wellness Visit if you have not done so already this year.  You may set up this appointment before you leave today or you may call back ([phone]) and schedule an appointment.  Please make sure when you call that you mention that you are scheduling your Annual Wellness Visit with the clinical pharmacist so that the appointment may be made for the proper length of time.     Continue current medications. Continue good therapeutic lifestyle changes which include good diet and exercise. Fall precautions discussed with patient. If an FOBT was given today- please return it to our front desk. If you are over 54 years old - you may need Prevnar 85 or the adult Pneumonia vaccine.  Flu Shots are still available at our office. If you still haven't had one please call to set up a nurse visit to get one.   After your visit with [place] today you will receive a survey in the mail or online from [employer] regarding your care with [place]. Please  take a moment to fill this out. Your feedback is very important to [place] as you can help [place] better  understand your patient needs as well as improve your experience and satisfaction. WE CARE ABOUT YOU!!!   The blood sugar has been under good control you should continue your current treatment The cholesterol numbers of also been under good control and he should continue with current treatment Continue to use her spiritual side and be there for your son. You and your wife and your former wife should support him as much as he will let you support him   Arrie Senate MD

## 2015-01-19 NOTE — Patient Instructions (Addendum)
Medicare Annual Wellness Visit  [facility] and the medical providers at [facility] strive to bring you the best medical care.  In doing so we not only want to address your current medical conditions and concerns but also to detect new conditions early and prevent illness, disease and health-related problems.    Medicare offers a yearly Wellness Visit which allows our clinical staff to assess your need for preventative services including immunizations, lifestyle education, counseling to decrease risk of preventable diseases and screening for fall risk and other medical concerns.    This visit is provided free of charge (no copay) for all Medicare recipients. The clinical pharmacists at [facility] have begun to conduct these Wellness Visits which will also include a thorough review of all your medications.    As you primary medical provider recommend that you make an appointment for your Annual Wellness Visit if you have not done so already this year.  You may set up this appointment before you leave today or you may call back ([phone]) and schedule an appointment.  Please make sure when you call that you mention that you are scheduling your Annual Wellness Visit with the clinical pharmacist so that the appointment may be made for the proper length of time.     Continue current medications. Continue good therapeutic lifestyle changes which include good diet and exercise. Fall precautions discussed with patient. If an FOBT was given today- please return it to our front desk. If you are over 34 years old - you may need Prevnar 3 or the adult Pneumonia vaccine.  Flu Shots are still available at our office. If you still haven't had one please call to set up a nurse visit to get one.   After your visit with [place] today you will receive a survey in the mail or online from [employer] regarding your care with [place]. Please take a moment to  fill this out. Your feedback is very important to [place] as you can help [place] better understand your patient needs as well as improve your experience and satisfaction. WE CARE ABOUT YOU!!!   The blood sugar has been under good control you should continue your current treatment The cholesterol numbers of also been under good control and he should continue with current treatment Continue to use her spiritual side and be there for your son. You and your wife and your former wife should support him as much as he will let you support him

## 2015-01-20 ENCOUNTER — Other Ambulatory Visit: Payer: Self-pay | Admitting: Family Medicine

## 2015-01-20 LAB — MICROALBUMIN, URINE: MICROALBUM., U, RANDOM: 17 ug/mL

## 2015-02-01 ENCOUNTER — Encounter: Payer: Self-pay | Admitting: Family Medicine

## 2015-02-01 ENCOUNTER — Encounter: Payer: Self-pay | Admitting: Pulmonary Disease

## 2015-02-02 NOTE — Telephone Encounter (Signed)
[place] - please advise. Thanks.

## 2015-02-02 NOTE — Telephone Encounter (Signed)
Make sure he got his cpap pressure increased. Sleep apnea can cause trauma to the back of the throat, but should not cause bleeding.  We can refer him to ENT for evaluation to make sure nothing else going on.  See if he has ever seen one, and can refer.  If not, refer to [facility]

## 2015-02-03 ENCOUNTER — Telehealth: Payer: Self-pay | Admitting: Family Medicine

## 2015-02-03 MED ORDER — AMOXICILLIN 500 MG PO CAPS: 500.0000 mg | ORAL_CAPSULE | Freq: Three times a day (TID) | ORAL | Status: DC

## 2015-02-03 NOTE — Telephone Encounter (Signed)
Sleep apnea can make back of throat extremely sore, but should not cause bleeding.  Will leave decision to see ENT to him.  If it recurs, he definitely needs to go see them.

## 2015-02-03 NOTE — Telephone Encounter (Signed)
[place] please advise. Thanks.

## 2015-02-03 NOTE — Telephone Encounter (Signed)
[place] are you okay with making referral? Thanks.

## 2015-02-03 NOTE — Telephone Encounter (Signed)
We need to stop going back and forth here.  If the pt thinks he is bleeding from his throat, he needs to see ENT.  If he is not having bleeding but is concerned about his throat discomfort, he should go see ENT.  Therefore, he should go see ENT!!!

## 2015-02-03 NOTE — Telephone Encounter (Signed)
Please call a prescription in for amoxicillin 500 #30 one 3 times a day for infection until completed. He should take Mucinex maximum strength plain, blue and white in color. 1 twice daily for cough and congestion with a large glass of water

## 2015-02-03 NOTE — Telephone Encounter (Signed)
Patient states that he is coughing, runny a low grade fever and would like a rx for amoxicillin called in

## 2015-02-04 ENCOUNTER — Encounter: Payer: Self-pay | Admitting: Family Medicine

## 2015-02-08 ENCOUNTER — Other Ambulatory Visit: Payer: Self-pay | Admitting: Family Medicine

## 2015-02-10 ENCOUNTER — Other Ambulatory Visit: Payer: BLUE CROSS/BLUE SHIELD

## 2015-02-10 DIAGNOSIS — Z1212 Encounter for screening for malignant neoplasm of rectum: Secondary | ICD-10-CM

## 2015-02-10 NOTE — Progress Notes (Signed)
Lab only 

## 2015-02-11 LAB — FECAL OCCULT BLOOD, IMMUNOCHEMICAL: Fecal Occult Bld: NEGATIVE

## 2015-02-19 ENCOUNTER — Encounter: Payer: Self-pay | Admitting: *Deleted

## 2015-02-22 ENCOUNTER — Other Ambulatory Visit: Payer: Self-pay | Admitting: Family Medicine

## 2015-02-24 ENCOUNTER — Ambulatory Visit (INDEPENDENT_AMBULATORY_CARE_PROVIDER_SITE_OTHER): Payer: BLUE CROSS/BLUE SHIELD | Admitting: Family Medicine

## 2015-02-24 ENCOUNTER — Encounter: Payer: Self-pay | Admitting: Family Medicine

## 2015-02-24 VITALS — BP 115/67 | HR 80 | Temp 97.7°F | Ht 70.0 in | Wt 241.0 lb

## 2015-02-24 DIAGNOSIS — R799 Abnormal finding of blood chemistry, unspecified: Secondary | ICD-10-CM

## 2015-02-24 DIAGNOSIS — Z0000 Encounter for general adult medical examination without abnormal findings: Secondary | ICD-10-CM

## 2015-02-24 DIAGNOSIS — N400 Enlarged prostate without lower urinary tract symptoms: Secondary | ICD-10-CM

## 2015-02-24 DIAGNOSIS — E559 Vitamin D deficiency, unspecified: Secondary | ICD-10-CM

## 2015-02-24 DIAGNOSIS — D696 Thrombocytopenia, unspecified: Secondary | ICD-10-CM

## 2015-02-24 DIAGNOSIS — E785 Hyperlipidemia, unspecified: Secondary | ICD-10-CM

## 2015-02-24 DIAGNOSIS — E349 Endocrine disorder, unspecified: Secondary | ICD-10-CM

## 2015-02-24 DIAGNOSIS — E108 Type 1 diabetes mellitus with unspecified complications: Secondary | ICD-10-CM

## 2015-02-24 DIAGNOSIS — I10 Essential (primary) hypertension: Secondary | ICD-10-CM

## 2015-02-24 LAB — POCT CBC
GRANULOCYTE PERCENT: 66.9 % (ref 37–80)
HCT, POC: 53.5 % (ref 43.5–53.7)
Hemoglobin: 17.3 g/dL (ref 14.1–18.1)
Lymph, poc: 2 (ref 0.6–3.4)
MCH, POC: 32.6 pg — AB (ref 27–31.2)
MCHC: 32.3 g/dL (ref 31.8–35.4)
MCV: 100.9 fL — AB (ref 80–97)
MPV: 8.2 fL (ref 0–99.8)
POC Granulocyte: 5.6 (ref 2–6.9)
POC LYMPH PERCENT: 23.9 %L (ref 10–50)
Platelet Count, POC: 141 10*3/uL — AB (ref 142–424)
RBC: 5.3 M/uL (ref 4.69–6.13)
RDW, POC: 15.3 %
WBC: 8.4 10*3/uL (ref 4.6–10.2)

## 2015-02-24 NOTE — Progress Notes (Signed)
Subjective:    Patient ID: Tony Cantu, male    DOB: 1951-02-13, 64 y.o.   MRN: [id number]  HPI Patient is here today for annual wellness exam and follow up of chronic medical problems which includes hypertension, hyperlipidemia, and diabetes. He is taking medications regularly.     Patient Active Problem List   Diagnosis Date Noted  . Thrombocytopenia 07/21/2014  . Bilateral carotid bruits 05/11/2014  . Vitamin D deficiency 09/17/2013  . BPH (benign prostatic hyperplasia) 05/22/2013  . 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 Diabetes type 2, controlled 01/10/2013  . Essential hypertension, benign 01/10/2013  . OSA (obstructive sleep apnea) 05/16/2012  . Edema 02/21/2012  . At risk for coronary artery disease 03/20/2011  . Obesity 03/20/2011  . WEIGHT GAIN, ABNORMAL 11/09/2010  . Hyperlipidemia 06/22/2010  . ALLERGIC RHINITIS 06/22/2010  . ASTHMA 06/22/2010  . COUGH 06/22/2010   Outpatient Encounter Prescriptions as of 02/24/2015  Medication Sig  . aspirin 325 MG EC tablet Take 325 mg by mouth daily.  Marland Kitchen atorvastatin (LIPITOR) 40 MG tablet TAKE 1 TABLET DAILY  . b complex vitamins tablet Take 1 tablet by mouth daily.    . B-D ULTRA-FINE 33 LANCETS MISC Check Blood sugar 8 times daily and PRN.DX E11.9  . Calcium Carbonate (CALTRATE 600 PO) Take by mouth daily.    . Cholecalciferol (VITAMIN D3) 2000 UNITS capsule Take 2,000 Units by mouth daily.    Marland Kitchen EPIPEN 2-PAK 0.3 MG/0.3ML SOAJ injection   . fexofenadine (ALLEGRA) 180 MG tablet Take 180 mg by mouth daily.  . Flaxseed, Linseed, (FLAX SEED OIL PO) Take 1 capsule by mouth daily.  . furosemide (LASIX) 20 MG tablet Take 1.5 tablets (30 mg total) by mouth daily.  . Glucosamine-Chondroit-Vit C-Mn (GLUCOSAMINE 1500 COMPLEX) CAPS Take by mouth daily.    Marland Kitchen glucose blood (ONE TOUCH ULTRA TEST) test strip chesk BS q 2 hours PRN.dx.E11.9  . insulin glargine (LANTUS) 100 UNIT/ML injection Inject 0.75 mLs (75 Units total) into the  skin 2 (two) times daily. As directed  . insulin lispro (HUMALOG) 100 UNIT/ML injection Inject 0.2-0.5 mLs (20-50 Units total) into the skin 3 (three) times daily before meals. Sliding scale  . Insulin Syringe-Needle U-100 (BD INSULIN SYRINGE ULTRAFINE) 31G X 15/64" 1 ML MISC Inject 1 Syringe into the skin 2 (two) times daily. Dx.E11.9  . Insulin Syringe-Needle U-100 (BD INSULIN SYRINGE ULTRAFINE) 31G X 5/16" 0.5 ML MISC USE TO INJECT 8 TO 10 TIMES DAILY, dx E11.9  . montelukast (SINGULAIR) 10 MG tablet TAKE 1 TABLET AT BEDTIME  . MULTIPLE VITAMIN PO Take by mouth daily.  . niacin (NIASPAN) 500 MG CR tablet TAKE 1 TABLET DAILY  . pantoprazole (PROTONIX) 40 MG tablet TAKE 1 TABLET DAILY  . ranitidine (ZANTAC) 75 MG tablet Take 75 mg by mouth at bedtime.  . Testosterone (AXIRON) 30 MG/ACT SOLN Place 1 application onto the skin daily. Place 1 application under each axilla daily  . traMADol (ULTRAM) 50 MG tablet Take 1 tablet (50 mg total) by mouth every 6 (six) hours as needed.  . valsartan (DIOVAN) 160 MG tablet TAKE 1 TABLET DAILY  . [DISCONTINUED] amoxicillin (AMOXIL) 500 MG capsule Take 1 capsule (500 mg total) by mouth 3 (three) times daily.   No facility-administered encounter medications on file as of 02/24/2015.      Review of Systems  Constitutional: Negative.   HENT: Negative.   Eyes: Negative.   Respiratory: Negative.   Cardiovascular: Negative.  Gastrointestinal: Negative.   Endocrine: Negative.   Genitourinary: Negative.   Musculoskeletal: Negative.   Skin: Negative.   Allergic/Immunologic: Negative.   Neurological: Negative.   Hematological: Negative.   Psychiatric/Behavioral: Negative.        Objective:   Physical Exam  Constitutional: He is oriented to person, place, and time. He appears well-developed and well-nourished. No distress.  HENT:  Head: Normocephalic and atraumatic.  Right Ear: External ear normal.  Left Ear: External ear normal.  Nose: Nose normal.   Mouth/Throat: Oropharynx is clear and moist. No oropharyngeal exudate.  Mouth and throat are normal and there was no swelling about the uvula or in the posterior throat  Eyes: Conjunctivae and EOM are normal. Pupils are equal, round, and reactive to light. Right eye exhibits no discharge. Left eye exhibits no discharge. No scleral icterus.  Neck: Normal range of motion. Neck supple. No thyromegaly present.  Cardiovascular: Normal rate, regular rhythm, normal heart sounds and intact distal pulses.   No murmur heard. Pulmonary/Chest: Effort normal and breath sounds normal. No respiratory distress. He has no wheezes. He has no rales. He exhibits no tenderness.  Abdominal: Soft. Bowel sounds are normal. He exhibits no mass. There is no tenderness. There is no rebound and no guarding.  Obesity without masses or tenderness or inguinal adenopathy  Genitourinary: Rectum normal and penis normal.  Prostate is enlarged and smooth. The patient is absent the left testicle and there were no inguinal hernias palpable. The rectal exam was negative other than a large prostate and there were no rectal masses.  Musculoskeletal: Normal range of motion. He exhibits no edema.  Lymphadenopathy:    He has no cervical adenopathy.  Neurological: He is alert and oriented to person, place, and time. He has normal reflexes. No cranial nerve deficit.  Skin: Skin is warm and dry. No rash noted. No erythema. No pallor.  Psychiatric: He has a normal mood and affect. His behavior is normal. Judgment and thought content normal.  Nursing note and vitals reviewed.  BP 115/67 mmHg  Pulse 80  Temp(Src) 97.7 F (36.5 C) (Oral)  Ht 5' 10"  (1.778 m)  Wt 241 lb (109.317 kg)  BMI 34.58 kg/m2        Assessment & Plan:  1. BPH (benign prostatic hyperplasia) -The prostate was enlarged but his PSA is stable and we will continue to monitor this.  2. Testosterone deficiency -The most recent testosterone levels were within  normal limits however the patient is reducing the use of his Axiron to once daily and has been doing this for one month because of an elevated hemoglobin.  3. Vitamin D deficiency -He should continue with his current vitamin D as doing.  4. Thrombocytopenia -His most recent platelet count was normal.  5. Essential hypertension, benign -The blood pressure today was good and he should continue with current treatment  6. Hyperlipidemia -No change in treatment for cholesterol he will continue with current treatment  7. Type 1 diabetes mellitus with complications -The D7O on this patient continues to run low and is doing well and he monitors this very closely.  8. Abnormal blood chemistry -The hemoglobin has been running high and this is the reason for repeating the CBC today since he has reduced his Axiron. - POCT CBC  Patient Instructions  Continue current medications. Continue good therapeutic lifestyle changes which include good diet and exercise. Fall precautions discussed with patient. If an FOBT was given today- please return it to our front desk.  If you are over 8 years old - you may need Prevnar 47 or the adult Pneumonia vaccine.  Flu Shots are still available at our office. If you still haven't had one please call to set up a nurse visit to get one.   After your visit with [place] today you will receive a survey in the mail or online from [employer] regarding your care with [place]. Please take a moment to fill this out. Your feedback is very important to [place] as you can help [place] better understand your patient needs as well as improve your experience and satisfaction. WE CARE ABOUT YOU!!!     Arrie Senate MD

## 2015-02-24 NOTE — Patient Instructions (Addendum)
Continue current medications. Continue good therapeutic lifestyle changes which include good diet and exercise. Fall precautions discussed with patient. If an FOBT was given today- please return it to our front desk. If you are over 65 years old - you may need Prevnar 64 or the adult Pneumonia vaccine.  Flu Shots are still available at our office. If you still haven't had one please call to set up a nurse visit to get one.   After your visit with [place] today you will receive a survey in the mail or online from [employer] regarding your care with [place]. Please take a moment to fill this out. Your feedback is very important to [place] as you can help [place] better understand your patient needs as well as improve your experience and satisfaction. WE CARE ABOUT YOU!!!   The patient should take his medication with a large glass of water

## 2015-03-11 ENCOUNTER — Other Ambulatory Visit: Payer: Self-pay | Admitting: Family Medicine

## 2015-03-23 ENCOUNTER — Encounter: Payer: Self-pay | Admitting: Physician Assistant

## 2015-03-23 ENCOUNTER — Ambulatory Visit (INDEPENDENT_AMBULATORY_CARE_PROVIDER_SITE_OTHER): Payer: BLUE CROSS/BLUE SHIELD | Admitting: Physician Assistant

## 2015-03-23 VITALS — BP 116/64 | HR 62 | Temp 97.5°F | Ht 70.0 in | Wt 238.0 lb

## 2015-03-23 DIAGNOSIS — Q828 Other specified congenital malformations of skin: Secondary | ICD-10-CM

## 2015-03-23 DIAGNOSIS — L570 Actinic keratosis: Secondary | ICD-10-CM | POA: Diagnosis not present

## 2015-03-23 DIAGNOSIS — D485 Neoplasm of uncertain behavior of skin: Secondary | ICD-10-CM

## 2015-03-23 NOTE — Progress Notes (Signed)
   Subjective:    Patient ID: Tony Cantu, male    DOB: 06-20-1951, 64 y.o.   MRN: [id number]  HPI 64 y/o male presents with c/o dark lesion on right side of scalp and top of scalp that are enlarging. He statees that he picks at the lesions office. No bleeding or itching except when he scratched it one time. No h/o skin cancer. Saw Dr. Syble Creek 1.5 years ago.     Review of Systems  Constitutional: Negative.   HENT: Negative.   Eyes: Negative.   Respiratory: Negative.   Cardiovascular: Negative.   Gastrointestinal: Negative.   Endocrine: Negative.   Genitourinary: Negative.   Skin:       Enlarging lesion on scalp x 2        Objective:   Physical Exam  Constitutional: He is oriented to person, place, and time. He appears well-developed and well-nourished. No distress.  HENT:  Head: Normocephalic.  Neurological: He is alert and oriented to person, place, and time.  Skin: He is not diaphoretic.  Lentigo and inflamed seborrheic keratosis on scalp Inflamed Skin tags on bilateral axilla  Psychiatric: He has a normal mood and affect. His behavior is normal. Judgment and thought content normal.  Nursing note and vitals reviewed.         Assessment & Plan:  1. Neoplasm of uncertain behavior of skin <.53m lesion on right scalp bx'd to r/o dysplasia via wide shave  - Pathology  2. Actinic keratosis Treated with cryosurgery on scalp x 6  3. Accessory skin tags Treated with cryosurgery x 5 on bilateral axilla    RTC pending path results   Joakim Huesman A. GBenjamin StainPA-C

## 2015-03-26 LAB — PATHOLOGY

## 2015-04-08 ENCOUNTER — Other Ambulatory Visit (INDEPENDENT_AMBULATORY_CARE_PROVIDER_SITE_OTHER): Payer: BLUE CROSS/BLUE SHIELD

## 2015-04-08 DIAGNOSIS — R799 Abnormal finding of blood chemistry, unspecified: Secondary | ICD-10-CM | POA: Diagnosis not present

## 2015-04-08 NOTE — Progress Notes (Signed)
Lab only 

## 2015-04-09 LAB — CBC WITH DIFFERENTIAL/PLATELET
Basophils Absolute: 0 10*3/uL (ref 0.0–0.2)
Basos: 0 %
EOS (ABSOLUTE): 0.1 10*3/uL (ref 0.0–0.4)
EOS: 1 %
Hematocrit: 48.3 % (ref 37.5–51.0)
Hemoglobin: 16.7 g/dL (ref 12.6–17.7)
IMMATURE GRANS (ABS): 0 10*3/uL (ref 0.0–0.1)
Immature Granulocytes: 0 %
LYMPHS ABS: 1.2 10*3/uL (ref 0.7–3.1)
Lymphs: 17 %
MCH: 34.5 pg — AB (ref 26.6–33.0)
MCHC: 34.6 g/dL (ref 31.5–35.7)
MCV: 100 fL — AB (ref 79–97)
MONOS ABS: 1.1 10*3/uL — AB (ref 0.1–0.9)
Monocytes: 15 %
NEUTROS PCT: 67 %
Neutrophils Absolute: 4.7 10*3/uL (ref 1.4–7.0)
Platelets: 191 10*3/uL (ref 150–379)
RBC: 4.84 x10E6/uL (ref 4.14–5.80)
RDW: 14.9 % (ref 12.3–15.4)
WBC: 7.1 10*3/uL (ref 3.4–10.8)

## 2015-04-11 ENCOUNTER — Other Ambulatory Visit: Payer: Self-pay | Admitting: Family Medicine

## 2015-05-09 ENCOUNTER — Other Ambulatory Visit: Payer: Self-pay | Admitting: Family Medicine

## 2015-05-10 ENCOUNTER — Encounter: Payer: Self-pay | Admitting: Family Medicine

## 2015-05-13 ENCOUNTER — Ambulatory Visit: Payer: BLUE CROSS/BLUE SHIELD | Admitting: Nurse Practitioner

## 2015-05-20 ENCOUNTER — Ambulatory Visit (INDEPENDENT_AMBULATORY_CARE_PROVIDER_SITE_OTHER): Payer: BLUE CROSS/BLUE SHIELD | Admitting: Nurse Practitioner

## 2015-05-20 ENCOUNTER — Encounter: Payer: Self-pay | Admitting: Nurse Practitioner

## 2015-05-20 VITALS — BP 120/74 | HR 52 | Ht 70.0 in | Wt 228.1 lb

## 2015-05-20 DIAGNOSIS — E785 Hyperlipidemia, unspecified: Secondary | ICD-10-CM | POA: Diagnosis not present

## 2015-05-20 DIAGNOSIS — G4733 Obstructive sleep apnea (adult) (pediatric): Secondary | ICD-10-CM

## 2015-05-20 DIAGNOSIS — I10 Essential (primary) hypertension: Secondary | ICD-10-CM

## 2015-05-20 NOTE — Patient Instructions (Addendum)
We will be checking the following labs today - NONE   Medication Instructions:    Continue with your current medicines.     Testing/Procedures To Be Arranged:  N/A  Follow-Up:   See me in one year  Will need carotid doppler study in one year    Other Special Instructions:   Regular exercise!!!  Call the [facility] office at [phone] if you have any questions, problems or concerns.

## 2015-05-20 NOTE — Progress Notes (Signed)
CARDIOLOGY OFFICE NOTE  Date:  05/20/2015    Tony Cantu Date of Birth: [birth year] Medical Record #[id number]  PCP:  Redge Gainer, MD  Cardiologist:  Aundra Dubin    Chief Complaint  Patient presents with  . Hypertension    1 year check - seen for Dr. Burt Knack  . Hyperlipidemia    History of Present Illness: Tony Cantu is a 63 y.o. male who presents today for a one year check. Seen for Dr. Aundra Dubin. He has a HTN, IDDM, & obesity.  He had a cardiolite in 2009 that was a normal study. He uses CPAP for his sleep apnea. He has mild carotid disease - last checked 9/15 with 1 to 39% bilateral stenosis. Labs are checked by PCP.  Last seen a year ago and was felt to be doing ok from cardiac standpoint.  Comes back today. Here alone. He is doing ok from our standpoint. No chest pain. Not short of breath. Not dizzy or lightheaded. Labile blood sugars. Is currently separated from his 2nd wife and back with his first. Weight is down - not really trying but may be more from stress.   PMH: 1. Obesity 2. HTN 3. Diabetes mellitus with history of hypoglycemic episodes.  4. Hyperlipidemia 5. Cardiolite (6/09): Normal study.  6. Lumbar spine surgery in 1994 7. Hernia repair in 2003 8. Plantar fasciitis 9. Shoulder replacement 10. OSA on CPAP 11. Echo (7/13): EF 55-60%, mild LVH, mild RV dilation, mildly decreased RV systolic function.   Past Medical History  Diagnosis Date  . GERD (gastroesophageal reflux disease)   . Colon polyps   . Gastritis   . IDDM (insulin dependent diabetes mellitus)   . Testicle trouble     both   . BPH (benign prostatic hypertrophy)   . Hypertension     Past Surgical History  Procedure Laterality Date  . Tonsilectomy, adenoidectomy, bilateral myringotomy and tubes    . Lumbar disc surgery  8/96    Dr. Coralyn Mark, discectomy  . Inguinal hernia repair  2003    right   . Shoulder surgery  11/28/05    left partial  . Shoulder surgery  07/14/2006   RIGHT     Medications: Current Outpatient Prescriptions  Medication Sig Dispense Refill  . aspirin 325 MG EC tablet Take 325 mg by mouth daily.    Marland Kitchen atorvastatin (LIPITOR) 40 MG tablet TAKE 1 TABLET DAILY 90 tablet 1  . b complex vitamins tablet Take 1 tablet by mouth daily.      . B-D ULTRA-FINE 33 LANCETS MISC Check Blood sugar 8 times daily and PRN.DX E11.9 800 each 3  . Calcium Carbonate (CALTRATE 600 PO) Take by mouth daily.      . Cholecalciferol (VITAMIN D3) 2000 UNITS capsule Take 2,000 Units by mouth daily.      Marland Kitchen EPIPEN 2-PAK 0.3 MG/0.3ML SOAJ injection     . fexofenadine (ALLEGRA) 180 MG tablet Take 180 mg by mouth daily.    . Flaxseed, Linseed, (FLAX SEED OIL PO) Take 1 capsule by mouth daily.    . furosemide (LASIX) 20 MG tablet Take 1.5 tablets (30 mg total) by mouth daily. 135 tablet 3  . Glucosamine-Chondroit-Vit C-Mn (GLUCOSAMINE 1500 COMPLEX) CAPS Take by mouth daily.      Marland Kitchen glucose blood (ONE TOUCH ULTRA TEST) test strip chesk BS q 2 hours PRN.dx.E11.9 800 each 3  . insulin glargine (LANTUS) 100 UNIT/ML injection Inject 0.75 mLs (75 Units total) into the  skin 2 (two) times daily. As directed 12 vial 3  . insulin lispro (HUMALOG) 100 UNIT/ML injection Inject 0.2-0.5 mLs (20-50 Units total) into the skin 3 (three) times daily before meals. Sliding scale 12 vial 3  . Insulin Syringe-Needle U-100 (BD INSULIN SYRINGE ULTRAFINE) 31G X 15/64" 1 ML MISC Inject 1 Syringe into the skin 2 (two) times daily. Dx.E11.9 180 each 3  . Insulin Syringe-Needle U-100 (BD INSULIN SYRINGE ULTRAFINE) 31G X 5/16" 0.5 ML MISC USE TO INJECT 8 TO 10 TIMES DAILY, dx E11.9 800 each 3  . montelukast (SINGULAIR) 10 MG tablet TAKE 1 TABLET AT BEDTIME 90 tablet 3  . MULTIPLE VITAMIN PO Take by mouth daily.    . niacin (NIASPAN) 500 MG CR tablet TAKE 1 TABLET DAILY 90 tablet 1  . pantoprazole (PROTONIX) 40 MG tablet TAKE 1 TABLET DAILY 90 tablet 1  . ranitidine (ZANTAC) 75 MG tablet Take 75 mg by mouth  at bedtime.    . Testosterone (AXIRON) 30 MG/ACT SOLN Place 1 application onto the skin daily. Place 1 application under each axilla daily (Patient taking differently: Place 1 application onto the skin daily. Place 1 application under each axilla every other day) 270 mL 3  . traMADol (ULTRAM) 50 MG tablet Take 1 tablet (50 mg total) by mouth every 6 (six) hours as needed. 90 tablet 1  . valsartan (DIOVAN) 160 MG tablet TAKE 1 TABLET DAILY 90 tablet 1   No current facility-administered medications for this visit.    Allergies: Allergies  Allergen Reactions  . Erythromycin   . Sulfonamide Derivatives     Social History: The patient  reports that he quit smoking about 13 years ago. His smoking use included Cigarettes and Pipe. He quit after 34 years of use. He has never used smokeless tobacco. He reports that he drinks alcohol. He reports that he does not use illicit drugs.   Family History: The patient's family history includes Allergic rhinitis in his sister; Asthma in his father; Breast cancer in his maternal grandmother; Heart attack in his maternal grandmother and mother; Heart failure in his maternal grandmother and mother; Rheum arthritis in his maternal grandmother; Suicidality in his father.   Review of Systems: Please see the history of present illness.   Otherwise, the review of systems is positive for none.   All other systems are reviewed and negative.   Physical Exam: VS:  BP 120/74 mmHg  Pulse 52  Ht 5' 10"  (1.778 m)  Wt 228 lb 1.9 oz (103.475 kg)  BMI 32.73 kg/m2 .  BMI Body mass index is 32.73 kg/(m^2).  Wt Readings from Last 3 Encounters:  05/20/15 228 lb 1.9 oz (103.475 kg)  03/23/15 238 lb (107.956 kg)  02/24/15 241 lb (109.317 kg)    General: Pleasant. Well developed, well nourished and in no acute distress. He has lost 10 pounds since July and 13 since June.   HEENT: Normal. Neck: Supple, no JVD, does have + biilateral carotid bruits. No masses noted.    Cardiac: Regular rate and rhythm. No murmurs, rubs, or gallops. No edema.  Respiratory:  Lungs are clear to auscultation bilaterally with normal work of breathing.  GI: Soft and nontender.  MS: No deformity or atrophy. Gait and ROM intact. Skin: Warm and dry. Color is normal.  Neuro:  Strength and sensation are intact and no gross focal deficits noted.  Psych: Alert, appropriate and with normal affect.   LABORATORY DATA:  EKG:  EKG is ordered today.  This demonstrates sinus bradycardia with RBBB.  Lab Results  Component Value Date   WBC 7.1 04/08/2015   HGB 17.3 02/24/2015   HCT 48.3 04/08/2015   PLT 185 04/15/2013   GLUCOSE 44* 01/12/2015   CHOL 103 01/12/2015   TRIG 39 01/12/2015   HDL 53 01/12/2015   LDLCALC 44 01/03/2014   ALT 45* 01/12/2015   AST 39 01/12/2015   NA 144 01/12/2015   K 4.9 01/12/2015   CL 99 01/12/2015   CREATININE 0.97 01/12/2015   BUN 16 01/12/2015   CO2 28 01/12/2015   PSA 1.4 01/12/2015   HGBA1C 4.8 01/19/2015    BNP (last 3 results) No results for input(s): BNP in the last 8760 hours.  ProBNP (last 3 results) No results for input(s): PROBNP in the last 8760 hours.   Other Studies Reviewed Today:  Echo Study Conclusions from 02/2012  - Left ventricle: The cavity size was normal. Reiger thickness was increased in a pattern of mild LVH. Systolic function was normal. The estimated ejection fraction was in the range of 55% to 60%. Delay motion was normal; there were no regional Olheiser motion abnormalities. Doppler parameters are consistent with abnormal left ventricular relaxation (grade 1 diastolic dysfunction). - Aortic valve: Trivial regurgitation. - Left atrium: The atrium was mildly dilated. - Right ventricle: The cavity size was mildly dilated. Systolic function was mildly reduced.  Assessment/Plan: 1. HTN - BP looks great on current regimen.  2. HLD - on therapy. Recent data about Niaspan suggesting this may not be  beneficial - for repeat labs in December with PCP  3. OSA - on CPAP  4. IDDM  5. Weight loss - may need further evaluation. May need TSH with his next set of labs if this persists.  6. Carotid bruits - needs study next year.   Current medicines are reviewed with the patient today.  The patient does not have concerns regarding medicines other than what has been noted above.  The following changes have been made:  See above.  Labs/ tests ordered today include:   No orders of the defined types were placed in this encounter.     Disposition:   FU with me in 1 year.   Patient is agreeable to this plan and will call if any problems develop in the interim.   Signed: Burtis Junes, RN, ANP-C 05/20/2015 9:31 AM  [facility] [address] [place], [place]  [address] Phone: [phone] Fax: [phone]

## 2015-05-21 ENCOUNTER — Encounter: Payer: Self-pay | Admitting: Family Medicine

## 2015-05-21 ENCOUNTER — Other Ambulatory Visit: Payer: Self-pay | Admitting: *Deleted

## 2015-05-21 MED ORDER — FUROSEMIDE 20 MG PO TABS: 30.0000 mg | ORAL_TABLET | Freq: Every day | ORAL | Status: DC

## 2015-06-06 ENCOUNTER — Encounter: Payer: Self-pay | Admitting: Family Medicine

## 2015-06-25 ENCOUNTER — Ambulatory Visit (INDEPENDENT_AMBULATORY_CARE_PROVIDER_SITE_OTHER): Payer: BLUE CROSS/BLUE SHIELD

## 2015-06-25 DIAGNOSIS — Z23 Encounter for immunization: Secondary | ICD-10-CM

## 2015-07-05 ENCOUNTER — Encounter: Payer: Self-pay | Admitting: Family Medicine

## 2015-07-17 ENCOUNTER — Encounter: Payer: Self-pay | Admitting: Family Medicine

## 2015-07-17 MED ORDER — TRAMADOL HCL 50 MG PO TABS: 50.0000 mg | ORAL_TABLET | Freq: Four times a day (QID) | ORAL | Status: DC | PRN

## 2015-07-17 MED ORDER — TESTOSTERONE 30 MG/ACT TD SOLN: 1.0000 "application " | Freq: Every day | TRANSDERMAL | Status: DC

## 2015-07-18 ENCOUNTER — Other Ambulatory Visit: Payer: Self-pay | Admitting: Family Medicine

## 2015-07-31 ENCOUNTER — Encounter: Payer: Self-pay | Admitting: Family Medicine

## 2015-08-02 ENCOUNTER — Encounter: Payer: Self-pay | Admitting: Family Medicine

## 2015-08-02 DIAGNOSIS — E785 Hyperlipidemia, unspecified: Secondary | ICD-10-CM

## 2015-08-02 DIAGNOSIS — I10 Essential (primary) hypertension: Secondary | ICD-10-CM

## 2015-08-04 ENCOUNTER — Encounter: Payer: Self-pay | Admitting: Family Medicine

## 2015-08-17 ENCOUNTER — Encounter: Payer: Self-pay | Admitting: Family Medicine

## 2015-08-20 ENCOUNTER — Other Ambulatory Visit: Payer: Self-pay | Admitting: Family Medicine

## 2015-08-25 ENCOUNTER — Encounter: Payer: Self-pay | Admitting: Family Medicine

## 2015-08-25 ENCOUNTER — Ambulatory Visit (INDEPENDENT_AMBULATORY_CARE_PROVIDER_SITE_OTHER): Payer: BLUE CROSS/BLUE SHIELD | Admitting: Family Medicine

## 2015-08-25 VITALS — BP 97/55 | HR 62 | Temp 97.3°F | Ht 70.0 in | Wt 234.0 lb

## 2015-08-25 DIAGNOSIS — E559 Vitamin D deficiency, unspecified: Secondary | ICD-10-CM

## 2015-08-25 DIAGNOSIS — E785 Hyperlipidemia, unspecified: Secondary | ICD-10-CM | POA: Diagnosis not present

## 2015-08-25 DIAGNOSIS — E108 Type 1 diabetes mellitus with unspecified complications: Secondary | ICD-10-CM | POA: Diagnosis not present

## 2015-08-25 DIAGNOSIS — D696 Thrombocytopenia, unspecified: Secondary | ICD-10-CM | POA: Diagnosis not present

## 2015-08-25 DIAGNOSIS — E291 Testicular hypofunction: Secondary | ICD-10-CM | POA: Diagnosis not present

## 2015-08-25 DIAGNOSIS — N182 Chronic kidney disease, stage 2 (mild): Secondary | ICD-10-CM | POA: Diagnosis not present

## 2015-08-25 DIAGNOSIS — I10 Essential (primary) hypertension: Secondary | ICD-10-CM

## 2015-08-25 DIAGNOSIS — E1122 Type 2 diabetes mellitus with diabetic chronic kidney disease: Secondary | ICD-10-CM

## 2015-08-25 DIAGNOSIS — N400 Enlarged prostate without lower urinary tract symptoms: Secondary | ICD-10-CM

## 2015-08-25 DIAGNOSIS — E349 Endocrine disorder, unspecified: Secondary | ICD-10-CM

## 2015-08-25 DIAGNOSIS — Z794 Long term (current) use of insulin: Secondary | ICD-10-CM

## 2015-08-25 LAB — POCT UA - MICROSCOPIC ONLY
Bacteria, U Microscopic: NEGATIVE
CRYSTALS, UR, HPF, POC: NEGATIVE
Casts, Ur, LPF, POC: NEGATIVE
MUCUS UA: NEGATIVE
RBC, urine, microscopic: NEGATIVE
YEAST UA: NEGATIVE

## 2015-08-25 LAB — POCT URINALYSIS DIPSTICK
BILIRUBIN UA: NEGATIVE
Blood, UA: NEGATIVE
GLUCOSE UA: NEGATIVE
KETONES UA: NEGATIVE
Leukocytes, UA: NEGATIVE
Nitrite, UA: NEGATIVE
Protein, UA: NEGATIVE
SPEC GRAV UA: 1.01
UROBILINOGEN UA: NEGATIVE
pH, UA: 7

## 2015-08-25 LAB — POCT GLYCOSYLATED HEMOGLOBIN (HGB A1C): Hemoglobin A1C: 5

## 2015-08-25 NOTE — Patient Instructions (Addendum)
Medicare Annual Wellness Visit  [facility] and the medical providers at [facility] strive to bring you the best medical care.  In doing so we not only want to address your current medical conditions and concerns but also to detect new conditions early and prevent illness, disease and health-related problems.    Medicare offers a yearly Wellness Visit which allows our clinical staff to assess your need for preventative services including immunizations, lifestyle education, counseling to decrease risk of preventable diseases and screening for fall risk and other medical concerns.    This visit is provided free of charge (no copay) for all Medicare recipients. The clinical pharmacists at [facility] have begun to conduct these Wellness Visits which will also include a thorough review of all your medications.    As you primary medical provider recommend that you make an appointment for your Annual Wellness Visit if you have not done so already this year.  You may set up this appointment before you leave today or you may call back ([phone]) and schedule an appointment.  Please make sure when you call that you mention that you are scheduling your Annual Wellness Visit with the clinical pharmacist so that the appointment may be made for the proper length of time.     Continue current medications. Continue good therapeutic lifestyle changes which include good diet and exercise. Fall precautions discussed with patient. If an FOBT was given today- please return it to our front desk. If you are over 72 years old - you may need Prevnar 71 or the adult Pneumonia vaccine.  **Flu shots are available--- please call and schedule a FLU-CLINIC appointment**  After your visit with [place] today you will receive a survey in the mail or online from [employer] regarding your care with [place]. Please take a moment to fill this out. Your feedback is very  important to [place] as you can help [place] better understand your patient needs as well as improve your experience and satisfaction. WE CARE ABOUT YOU!!!   Continue current treatment Monitor blood sugars closely so that we do not have these low blood sugar events Please complete the after visit survey that you will receive on my chart

## 2015-08-25 NOTE — Progress Notes (Signed)
Subjective:    Patient ID: Tony Cantu, male    DOB: Dec 15, 1950, 64 y.o.   MRN: [id number]  HPI Pt here for follow up and management of chronic medical problems which includes diabetes, hypertension, and hyperlipidemia. He is taking medications regularly. The patient is an insulin-dependent diabetic. He has been well controlled over the years with his A1c being always good. He has no specific complaints today. He is due to get lab work and a rectal exam today. He is requiring no refills. The patient indicates that he is having a lot of anxiety and stress over the past several months he is back with his wife. He has had a couple episodes recently of hypoglycemia. He is worked on his insulin and reducing this and has not had any more episodes recently. He denies chest pain shortness of breath trouble swallowing blood in the stool or black tarry bowel movements. He is passing his water without problems. He does have occasional indigestion and heartburn but this is no worse than usual.       Patient Active Problem List   Diagnosis Date Noted  . Thrombocytopenia ([facility]) 07/21/2014  . Bilateral carotid bruits 05/11/2014  . Vitamin D deficiency 09/17/2013  . BPH (benign prostatic hyperplasia) 05/22/2013  . 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 Diabetes type 2, controlled ([facility]) 01/10/2013  . Essential hypertension, benign 01/10/2013  . OSA (obstructive sleep apnea) 05/16/2012  . Edema 02/21/2012  . At risk for coronary artery disease 03/20/2011  . Obesity 03/20/2011  . WEIGHT GAIN, ABNORMAL 11/09/2010  . Hyperlipidemia 06/22/2010  . ALLERGIC RHINITIS 06/22/2010  . ASTHMA 06/22/2010  . COUGH 06/22/2010   Outpatient Encounter Prescriptions as of 08/25/2015  Medication Sig  . aspirin 325 MG EC tablet Take 325 mg by mouth daily.  Marland Kitchen atorvastatin (LIPITOR) 40 MG tablet TAKE 1 TABLET DAILY  . b complex vitamins tablet Take 1 tablet by mouth daily.    . B-D ULTRA-FINE 33 LANCETS MISC Check  Blood sugar 8 times daily and PRN.DX E11.9  . Calcium Carbonate (CALTRATE 600 PO) Take by mouth daily.    . Cholecalciferol (VITAMIN D3) 2000 UNITS capsule Take 2,000 Units by mouth daily.    Marland Kitchen EPIPEN 2-PAK 0.3 MG/0.3ML SOAJ injection   . fexofenadine (ALLEGRA) 180 MG tablet Take 180 mg by mouth daily.  . Flaxseed, Linseed, (FLAX SEED OIL PO) Take 1 capsule by mouth daily.  . furosemide (LASIX) 20 MG tablet Take 1.5 tablets (30 mg total) by mouth daily.  . Glucosamine-Chondroit-Vit C-Mn (GLUCOSAMINE 1500 COMPLEX) CAPS Take by mouth daily.    Marland Kitchen glucose blood (ONE TOUCH ULTRA TEST) test strip chesk BS q 2 hours PRN.dx.E11.9  . insulin glargine (LANTUS) 100 UNIT/ML injection Inject 0.75 mLs (75 Units total) into the skin 2 (two) times daily. As directed  . insulin lispro (HUMALOG) 100 UNIT/ML injection Inject 0.2-0.5 mLs (20-50 Units total) into the skin 3 (three) times daily before meals. Sliding scale  . Insulin Syringe-Needle U-100 (BD INSULIN SYRINGE ULTRAFINE) 31G X 15/64" 1 ML MISC Inject 1 Syringe into the skin 2 (two) times daily. Dx.E11.9  . Insulin Syringe-Needle U-100 (BD INSULIN SYRINGE ULTRAFINE) 31G X 5/16" 0.5 ML MISC USE TO INJECT 8 TO 10 TIMES DAILY, dx E11.9  . montelukast (SINGULAIR) 10 MG tablet TAKE 1 TABLET AT BEDTIME  . MULTIPLE VITAMIN PO Take by mouth daily.  . niacin (NIASPAN) 500 MG CR tablet TAKE 1 TABLET DAILY  . pantoprazole (PROTONIX) 40 MG  tablet TAKE 1 TABLET DAILY  . ranitidine (ZANTAC) 75 MG tablet Take 75 mg by mouth at bedtime.  . Testosterone (AXIRON) 30 MG/ACT SOLN Place 1 application onto the skin daily. Place 1 application under each axilla daily  . traMADol (ULTRAM) 50 MG tablet Take 1 tablet (50 mg total) by mouth every 6 (six) hours as needed.  . valsartan (DIOVAN) 160 MG tablet TAKE 1 TABLET DAILY   No facility-administered encounter medications on file as of 08/25/2015.       Review of Systems  Constitutional: Negative.   HENT: Negative.     Eyes: Negative.   Respiratory: Negative.   Cardiovascular: Negative.   Gastrointestinal: Negative.   Endocrine: Negative.   Genitourinary: Negative.   Musculoskeletal: Negative.   Skin: Negative.   Allergic/Immunologic: Negative.   Neurological: Negative.   Hematological: Negative.   Psychiatric/Behavioral: Negative.        Objective:   Physical Exam  Constitutional: He is oriented to person, place, and time. He appears well-developed and well-nourished. No distress.  HENT:  Head: Normocephalic and atraumatic.  Right Ear: External ear normal.  Left Ear: External ear normal.  Mouth/Throat: Oropharynx is clear and moist. No oropharyngeal exudate.  Nasal congestion bilaterally right greater than left  Eyes: Conjunctivae and EOM are normal. Pupils are equal, round, and reactive to light. Right eye exhibits no discharge. Left eye exhibits no discharge. No scleral icterus.  The patient had an eye exam in September.  Neck: Normal range of motion. Neck supple. No thyromegaly present.  Cardiovascular: Normal rate, regular rhythm and intact distal pulses.   No murmur heard. The heart is regular at 60/m  Pulmonary/Chest: Effort normal and breath sounds normal. No respiratory distress. He has no wheezes. He has no rales. He exhibits no tenderness.  Clear anteriorly and posteriorly and no axillary adenopathy  Abdominal: Soft. Bowel sounds are normal. He exhibits no mass. There is no tenderness. There is no rebound and no guarding.  Obesity without masses tenderness or organ enlargement  Genitourinary: Rectum normal and penis normal.  The prostate is enlarged but soft and smooth. There are no rectal masses. 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 No inguinal hernias were palpable.  Musculoskeletal: Normal range of motion. He exhibits no edema.  Lymphadenopathy:    He has no cervical adenopathy.  Neurological: He is alert and oriented to person, place, and time. He has  normal reflexes. No cranial nerve deficit.  Skin: Skin is warm and dry. No rash noted.  Psychiatric: He has a normal mood and affect. His behavior is normal. Judgment and thought content normal.  Nursing note and vitals reviewed.  BP 97/55 mmHg  Pulse 62  Temp(Src) 97.3 F (36.3 C) (Oral)  Ht 5' 10"  (1.778 m)  Wt 234 lb (106.142 kg)  BMI 33.58 kg/m2        Assessment & Plan:  1. Essential hypertension -The blood pressure is in fact slightly decreased today but there will be no change in the patient's medication regimen - BMP8+EGFR - CBC with Differential/Platelet - Hepatic function panel  2. Hyperlipidemia -Continue current treatment pending results of lab work - CBC with Differential/Platelet - NMR, lipoprofile  3. BPH (benign prostatic hyperplasia) -The prostate remains enlarged and soft and smooth. There were no rectal masses. The patient is not having any symptoms with passing his water. - CBC with Differential/Platelet - PSA, total and free - Testosterone,Free and Total - POCT urinalysis dipstick - POCT  UA - Microscopic Only  4. Testosterone deficiency -No abnormalities noted as results of taking testosterone. - CBC with Differential/Platelet - PSA, total and free - Testosterone,Free and Total - POCT urinalysis dipstick - POCT UA - Microscopic Only  5. Vitamin D deficiency -Continue current treatment pending results of lab work - CBC with Differential/Platelet - VITAMIN D 25 Hydroxy (Vit-D Deficiency, Fractures)  6. Thrombocytopenia ([facility]) -No bleeding issues - CBC with Differential/Platelet  7. 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 - POCT glycosylated hemoglobin (Hb A1C) - CBC with Differential/Platelet  8. 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  In doing so we not only want to address your current medical conditions and concerns but also to detect new conditions early and prevent illness, disease and health-related problems.    Medicare offers a yearly Wellness Visit which allows our clinical staff to assess your need for preventative services including immunizations, lifestyle education, counseling to decrease risk of preventable diseases and screening for fall risk and other medical concerns.    This visit is provided free of charge (no copay) for all Medicare recipients. The clinical pharmacists at [facility] have begun to conduct these Wellness Visits which will also include a thorough review of all your medications.    As you primary medical provider recommend that you make an appointment for your Annual Wellness Visit if you have not done so already this year.  You may set up this appointment before you leave today or you may call back ([phone]) and schedule an appointment.  Please make sure when you call that you mention that you are scheduling your Annual Wellness Visit with the clinical pharmacist so that the appointment may be made for the proper length of time.     Continue current medications. Continue good therapeutic lifestyle changes which include good diet and exercise. Fall precautions discussed with patient. If an FOBT was given today- please return it to our front desk. If you are over 51 years old - you may need Prevnar 4 or the adult Pneumonia vaccine.  **Flu shots are available--- please call and schedule a FLU-CLINIC appointment**  After your visit with [place]  today you will receive a survey in the mail or online from [employer] regarding your care with [place]. Please take a moment to fill this out. Your feedback is very important to [place] as you can help [place] better understand your patient needs as well as improve your experience and satisfaction. WE CARE ABOUT YOU!!!   Continue current treatment Monitor blood sugars closely so that we do not have these low blood sugar events Please complete the after visit survey that you will receive on my chart   Arrie Senate MD

## 2015-08-26 LAB — CBC WITH DIFFERENTIAL/PLATELET
Basophils Absolute: 0 10*3/uL (ref 0.0–0.2)
Basos: 1 %
EOS (ABSOLUTE): 0.2 10*3/uL (ref 0.0–0.4)
EOS: 3 %
HEMATOCRIT: 48.5 % (ref 37.5–51.0)
Hemoglobin: 16.4 g/dL (ref 12.6–17.7)
IMMATURE GRANULOCYTES: 0 %
Immature Grans (Abs): 0 10*3/uL (ref 0.0–0.1)
LYMPHS ABS: 1.4 10*3/uL (ref 0.7–3.1)
Lymphs: 26 %
MCH: 36 pg — ABNORMAL HIGH (ref 26.6–33.0)
MCHC: 33.8 g/dL (ref 31.5–35.7)
MCV: 106 fL — AB (ref 79–97)
Monocytes Absolute: 0.9 10*3/uL (ref 0.1–0.9)
Monocytes: 17 %
NEUTROS PCT: 53 %
Neutrophils Absolute: 2.8 10*3/uL (ref 1.4–7.0)
PLATELETS: 164 10*3/uL (ref 150–379)
RBC: 4.56 x10E6/uL (ref 4.14–5.80)
RDW: 13 % (ref 12.3–15.4)
WBC: 5.4 10*3/uL (ref 3.4–10.8)

## 2015-08-26 LAB — TESTOSTERONE,FREE AND TOTAL
TESTOSTERONE: 96 ng/dL — AB (ref 348–1197)
Testosterone, Free: 2.3 pg/mL — ABNORMAL LOW (ref 6.6–18.1)

## 2015-08-26 LAB — BMP8+EGFR
BUN / CREAT RATIO: 15 (ref 10–22)
BUN: 15 mg/dL (ref 8–27)
CO2: 28 mmol/L (ref 18–29)
CREATININE: 0.97 mg/dL (ref 0.76–1.27)
Calcium: 9.7 mg/dL (ref 8.6–10.2)
Chloride: 98 mmol/L (ref 96–106)
GFR calc Af Amer: 94 mL/min/{1.73_m2} (ref >59)
GFR calc non Af Amer: 82 mL/min/{1.73_m2} (ref >59)
GLUCOSE: 141 mg/dL — AB (ref 65–99)
POTASSIUM: 4.6 mmol/L (ref 3.5–5.2)
SODIUM: 140 mmol/L (ref 134–144)

## 2015-08-26 LAB — NMR, LIPOPROFILE
Cholesterol: 122 mg/dL (ref 100–199)
HDL CHOLESTEROL BY NMR: 65 mg/dL (ref >39)
HDL Particle Number: 36.6 umol/L (ref >=30.5)
LDL Particle Number: 569 nmol/L (ref <1000)
LDL Size: 21.2 nm (ref >20.5)
LDL-C: 43 mg/dL (ref 0–99)
LP-IR Score: <25 (ref <=45)
Small LDL Particle Number: 233 nmol/L (ref <=527)
TRIGLYCERIDES BY NMR: 69 mg/dL (ref 0–149)

## 2015-08-26 LAB — PSA, TOTAL AND FREE
PROSTATE SPECIFIC AG, SERUM: 1.3 ng/mL (ref 0.0–4.0)
PSA FREE PCT: 30.8 %
PSA FREE: 0.4 ng/mL

## 2015-08-26 LAB — VITAMIN D 25 HYDROXY (VIT D DEFICIENCY, FRACTURES): VIT D 25 HYDROXY: 34.3 ng/mL (ref 30.0–100.0)

## 2015-08-26 LAB — HEPATIC FUNCTION PANEL
ALBUMIN: 4.4 g/dL (ref 3.6–4.8)
ALT: 35 IU/L (ref 0–44)
AST: 34 IU/L (ref 0–40)
Alkaline Phosphatase: 76 IU/L (ref 39–117)
Bilirubin Total: 0.7 mg/dL (ref 0.0–1.2)
Bilirubin, Direct: 0.21 mg/dL (ref 0.00–0.40)
Total Protein: 7.1 g/dL (ref 6.0–8.5)

## 2015-09-09 DIAGNOSIS — M47812 Spondylosis without myelopathy or radiculopathy, cervical region: Secondary | ICD-10-CM | POA: Diagnosis not present

## 2015-09-09 DIAGNOSIS — G5601 Carpal tunnel syndrome, right upper limb: Secondary | ICD-10-CM | POA: Insufficient documentation

## 2015-09-11 ENCOUNTER — Encounter: Payer: Self-pay | Admitting: Physician Assistant

## 2015-10-01 DIAGNOSIS — G5601 Carpal tunnel syndrome, right upper limb: Secondary | ICD-10-CM | POA: Diagnosis not present

## 2015-10-05 DIAGNOSIS — M47812 Spondylosis without myelopathy or radiculopathy, cervical region: Secondary | ICD-10-CM | POA: Diagnosis not present

## 2015-10-05 DIAGNOSIS — G5601 Carpal tunnel syndrome, right upper limb: Secondary | ICD-10-CM | POA: Diagnosis not present

## 2015-10-05 DIAGNOSIS — G5602 Carpal tunnel syndrome, left upper limb: Secondary | ICD-10-CM | POA: Diagnosis not present

## 2015-10-06 ENCOUNTER — Other Ambulatory Visit: Payer: Self-pay | Admitting: Orthopedic Surgery

## 2015-10-07 ENCOUNTER — Encounter (HOSPITAL_BASED_OUTPATIENT_CLINIC_OR_DEPARTMENT_OTHER): Payer: Self-pay | Admitting: *Deleted

## 2015-10-08 ENCOUNTER — Other Ambulatory Visit: Payer: Self-pay | Admitting: Physician Assistant

## 2015-10-08 ENCOUNTER — Encounter (HOSPITAL_COMMUNITY)
Admission: RE | Admit: 2015-10-08 | Discharge: 2015-10-08 | Disposition: A | Discharge status: Home / Self Care | Payer: Medicare Other | Source: Ambulatory Visit | Attending: Orthopedic Surgery | Admitting: Orthopedic Surgery

## 2015-10-08 DIAGNOSIS — G5601 Carpal tunnel syndrome, right upper limb: Secondary | ICD-10-CM | POA: Insufficient documentation

## 2015-10-08 DIAGNOSIS — Z01812 Encounter for preprocedural laboratory examination: Secondary | ICD-10-CM | POA: Diagnosis not present

## 2015-10-08 LAB — BASIC METABOLIC PANEL
ANION GAP: 9 (ref 5–15)
BUN: 17 mg/dL (ref 6–20)
CALCIUM: 9.4 mg/dL (ref 8.9–10.3)
CO2: 27 mmol/L (ref 22–32)
Chloride: 104 mmol/L (ref 101–111)
Creatinine, Ser: 0.92 mg/dL (ref 0.61–1.24)
GFR calc Af Amer: >60 mL/min (ref >60)
GLUCOSE: 87 mg/dL (ref 65–99)
Potassium: 3.9 mmol/L (ref 3.5–5.1)
SODIUM: 140 mmol/L (ref 135–145)

## 2015-10-13 ENCOUNTER — Ambulatory Visit (HOSPITAL_BASED_OUTPATIENT_CLINIC_OR_DEPARTMENT_OTHER): Payer: Medicare Other | Admitting: Certified Registered"

## 2015-10-13 ENCOUNTER — Encounter (HOSPITAL_BASED_OUTPATIENT_CLINIC_OR_DEPARTMENT_OTHER)
Admission: RE | Disposition: A | Discharge status: Home / Self Care | Payer: Self-pay | Source: Ambulatory Visit | Attending: Orthopedic Surgery

## 2015-10-13 ENCOUNTER — Ambulatory Visit (HOSPITAL_BASED_OUTPATIENT_CLINIC_OR_DEPARTMENT_OTHER)
Admission: RE | Admit: 2015-10-13 | Discharge: 2015-10-13 | Disposition: A | Discharge status: Home / Self Care | Payer: Medicare Other | Source: Ambulatory Visit | Attending: Orthopedic Surgery | Admitting: Orthopedic Surgery

## 2015-10-13 ENCOUNTER — Encounter (HOSPITAL_BASED_OUTPATIENT_CLINIC_OR_DEPARTMENT_OTHER): Payer: Self-pay

## 2015-10-13 DIAGNOSIS — Z794 Long term (current) use of insulin: Secondary | ICD-10-CM | POA: Diagnosis not present

## 2015-10-13 DIAGNOSIS — I10 Essential (primary) hypertension: Secondary | ICD-10-CM | POA: Diagnosis not present

## 2015-10-13 DIAGNOSIS — Z79899 Other long term (current) drug therapy: Secondary | ICD-10-CM | POA: Insufficient documentation

## 2015-10-13 DIAGNOSIS — J45909 Unspecified asthma, uncomplicated: Secondary | ICD-10-CM | POA: Insufficient documentation

## 2015-10-13 DIAGNOSIS — Z7982 Long term (current) use of aspirin: Secondary | ICD-10-CM | POA: Insufficient documentation

## 2015-10-13 DIAGNOSIS — G4730 Sleep apnea, unspecified: Secondary | ICD-10-CM | POA: Diagnosis not present

## 2015-10-13 DIAGNOSIS — N400 Enlarged prostate without lower urinary tract symptoms: Secondary | ICD-10-CM | POA: Diagnosis not present

## 2015-10-13 DIAGNOSIS — G5603 Carpal tunnel syndrome, bilateral upper limbs: Secondary | ICD-10-CM | POA: Insufficient documentation

## 2015-10-13 DIAGNOSIS — E119 Type 2 diabetes mellitus without complications: Secondary | ICD-10-CM | POA: Diagnosis not present

## 2015-10-13 DIAGNOSIS — Z87891 Personal history of nicotine dependence: Secondary | ICD-10-CM | POA: Diagnosis not present

## 2015-10-13 DIAGNOSIS — Z6835 Body mass index (BMI) 35.0-35.9, adult: Secondary | ICD-10-CM | POA: Insufficient documentation

## 2015-10-13 DIAGNOSIS — K219 Gastro-esophageal reflux disease without esophagitis: Secondary | ICD-10-CM | POA: Insufficient documentation

## 2015-10-13 DIAGNOSIS — G5601 Carpal tunnel syndrome, right upper limb: Secondary | ICD-10-CM | POA: Diagnosis not present

## 2015-10-13 HISTORY — DX: Unspecified osteoarthritis, unspecified site: M19.90

## 2015-10-13 HISTORY — DX: Sleep apnea, unspecified: G47.30

## 2015-10-13 HISTORY — PX: CARPAL TUNNEL RELEASE: SHX101

## 2015-10-13 HISTORY — DX: Unspecified asthma, uncomplicated: J45.909

## 2015-10-13 LAB — GLUCOSE, CAPILLARY
GLUCOSE-CAPILLARY: 89 mg/dL (ref 65–99)
GLUCOSE-CAPILLARY: 73 mg/dL (ref 65–99)

## 2015-10-13 SURGERY — CARPAL TUNNEL RELEASE
Anesthesia: Monitor Anesthesia Care | Site: Wrist | Laterality: Right

## 2015-10-13 MED ORDER — PROPOFOL 10 MG/ML IV BOLUS
INTRAVENOUS | Status: AC
  Filled 2015-10-13: qty 20

## 2015-10-13 MED ORDER — LACTATED RINGERS IV SOLN
INTRAVENOUS | Status: DC
  Administered 2015-10-13: 09:00:00 via INTRAVENOUS

## 2015-10-13 MED ORDER — HYDROCODONE-ACETAMINOPHEN 5-325 MG PO TABS: 1.0000 | ORAL_TABLET | Freq: Four times a day (QID) | ORAL | Status: DC | PRN

## 2015-10-13 MED ORDER — MEPERIDINE HCL 25 MG/ML IJ SOLN: 6.2500 mg | INTRAMUSCULAR | Status: DC | PRN

## 2015-10-13 MED ORDER — LIDOCAINE HCL (CARDIAC) 20 MG/ML IV SOLN
INTRAVENOUS | Status: AC
  Filled 2015-10-13: qty 5

## 2015-10-13 MED ORDER — CEFAZOLIN SODIUM-DEXTROSE 2-3 GM-% IV SOLR
INTRAVENOUS | Status: AC
  Filled 2015-10-13: qty 50

## 2015-10-13 MED ORDER — ONDANSETRON HCL 4 MG/2ML IJ SOLN
INTRAMUSCULAR | Status: DC | PRN
  Administered 2015-10-13: 4 mg via INTRAVENOUS

## 2015-10-13 MED ORDER — GLYCOPYRROLATE 0.2 MG/ML IJ SOLN: 0.2000 mg | Freq: Once | INTRAMUSCULAR | Status: DC | PRN

## 2015-10-13 MED ORDER — MIDAZOLAM HCL 2 MG/2ML IJ SOLN
1.0000 mg | INTRAMUSCULAR | Status: DC | PRN
  Administered 2015-10-13: 1 mg via INTRAVENOUS

## 2015-10-13 MED ORDER — FENTANYL CITRATE (PF) 100 MCG/2ML IJ SOLN
50.0000 ug | INTRAMUSCULAR | Status: DC | PRN
  Administered 2015-10-13: 50 ug via INTRAVENOUS

## 2015-10-13 MED ORDER — FENTANYL CITRATE (PF) 100 MCG/2ML IJ SOLN
INTRAMUSCULAR | Status: AC
  Filled 2015-10-13: qty 2

## 2015-10-13 MED ORDER — LIDOCAINE HCL (PF) 0.5 % IJ SOLN
INTRAMUSCULAR | Status: DC | PRN
  Administered 2015-10-13: 30 mL via INTRAVENOUS

## 2015-10-13 MED ORDER — SCOPOLAMINE 1 MG/3DAYS TD PT72
1.0000 | MEDICATED_PATCH | Freq: Once | TRANSDERMAL | Status: DC | PRN
Start: 2015-10-13 — End: 2015-10-13

## 2015-10-13 MED ORDER — ONDANSETRON HCL 4 MG/2ML IJ SOLN
INTRAMUSCULAR | Status: AC
  Filled 2015-10-13: qty 2

## 2015-10-13 MED ORDER — CEFAZOLIN SODIUM-DEXTROSE 2-3 GM-% IV SOLR
2.0000 g | INTRAVENOUS | Status: AC
  Administered 2015-10-13: 2 g via INTRAVENOUS

## 2015-10-13 MED ORDER — BUPIVACAINE HCL (PF) 0.25 % IJ SOLN
INTRAMUSCULAR | Status: DC | PRN
  Administered 2015-10-13: 4 mL

## 2015-10-13 MED ORDER — PROPOFOL 10 MG/ML IV BOLUS
INTRAVENOUS | Status: DC | PRN
  Administered 2015-10-13 (×4): 20 mg via INTRAVENOUS

## 2015-10-13 MED ORDER — MIDAZOLAM HCL 2 MG/2ML IJ SOLN
INTRAMUSCULAR | Status: AC
  Filled 2015-10-13: qty 2

## 2015-10-13 MED ORDER — PROMETHAZINE HCL 25 MG/ML IJ SOLN: 6.2500 mg | INTRAMUSCULAR | Status: DC | PRN

## 2015-10-13 MED ORDER — FENTANYL CITRATE (PF) 100 MCG/2ML IJ SOLN: 25.0000 ug | INTRAMUSCULAR | Status: DC | PRN

## 2015-10-13 MED ORDER — CHLORHEXIDINE GLUCONATE 4 % EX LIQD: 60.0000 mL | Freq: Once | CUTANEOUS | Status: DC

## 2015-10-13 SURGICAL SUPPLY — 34 items
BLADE SURG 15 STRL LF DISP TIS (BLADE) ×1 IMPLANT
BLADE SURG 15 STRL SS (BLADE) ×1
BNDG COHESIVE 3X5 TAN STRL LF (GAUZE/BANDAGES/DRESSINGS) ×2 IMPLANT
BNDG ESMARK 4X9 LF (GAUZE/BANDAGES/DRESSINGS) IMPLANT
BNDG GAUZE ELAST 4 BULKY (GAUZE/BANDAGES/DRESSINGS) ×2 IMPLANT
CHLORAPREP W/TINT 26ML (MISCELLANEOUS) ×2 IMPLANT
CORDS BIPOLAR (ELECTRODE) ×2 IMPLANT
COVER BACK TABLE 60X90IN (DRAPES) ×2 IMPLANT
COVER MAYO STAND STRL (DRAPES) ×2 IMPLANT
CUFF TOURNIQUET SINGLE 18IN (TOURNIQUET CUFF) ×2 IMPLANT
DRAPE EXTREMITY T 121X128X90 (DRAPE) ×2 IMPLANT
DRAPE SURG 17X23 STRL (DRAPES) ×2 IMPLANT
DRSG PAD ABDOMINAL 8X10 ST (GAUZE/BANDAGES/DRESSINGS) ×2 IMPLANT
GAUZE SPONGE 4X4 12PLY STRL (GAUZE/BANDAGES/DRESSINGS) ×2 IMPLANT
GAUZE XEROFORM 1X8 LF (GAUZE/BANDAGES/DRESSINGS) ×2 IMPLANT
GLOVE BIO SURGEON STRL SZ 6.5 (GLOVE) ×4 IMPLANT
GLOVE BIOGEL PI IND STRL 7.0 (GLOVE) ×3 IMPLANT
GLOVE BIOGEL PI IND STRL 8.5 (GLOVE) ×1 IMPLANT
GLOVE BIOGEL PI INDICATOR 7.0 (GLOVE) ×3
GLOVE BIOGEL PI INDICATOR 8.5 (GLOVE) ×1
GLOVE SURG ORTHO 8.0 STRL STRW (GLOVE) ×2 IMPLANT
GOWN STRL REUS W/ TWL LRG LVL3 (GOWN DISPOSABLE) ×2 IMPLANT
GOWN STRL REUS W/TWL LRG LVL3 (GOWN DISPOSABLE) ×2
GOWN STRL REUS W/TWL XL LVL3 (GOWN DISPOSABLE) ×2 IMPLANT
NEEDLE PRECISIONGLIDE 27X1.5 (NEEDLE) ×2 IMPLANT
NS IRRIG 1000ML POUR BTL (IV SOLUTION) ×2 IMPLANT
PACK BASIN DAY SURGERY FS (CUSTOM PROCEDURE TRAY) ×2 IMPLANT
STOCKINETTE 4X48 STRL (DRAPES) ×2 IMPLANT
SUT ETHILON 4 0 PS 2 18 (SUTURE) ×2 IMPLANT
SUT VICRYL 4-0 PS2 18IN ABS (SUTURE) IMPLANT
SYR BULB 3OZ (MISCELLANEOUS) ×2 IMPLANT
SYR CONTROL 10ML LL (SYRINGE) ×2 IMPLANT
TOWEL OR 17X24 6PK STRL BLUE (TOWEL DISPOSABLE) ×2 IMPLANT
UNDERPAD 30X30 (UNDERPADS AND DIAPERS) ×2 IMPLANT

## 2015-10-13 NOTE — Op Note (Signed)
NAMEJULIAN, MEDINA NO.:  [id number]  MEDICAL RECORD NO.:  [id number]  LOCATION:                                 FACILITY:  PHYSICIAN:  Daryll Brod, M.D.       DATE OF BIRTH:  1951-06-27  DATE OF PROCEDURE:  10/13/2015 DATE OF DISCHARGE:                              OPERATIVE REPORT   PREOPERATIVE DIAGNOSIS:  Carpal tunnel syndrome, right hand.  POSTOPERATIVE DIAGNOSIS:  Carpal tunnel syndrome, right hand.  OPERATION:  Decompression of right median nerve.  SURGEON:  Daryll Brod, M.D.  ANESTHESIA:  Forearm-based IV regional with local infiltration and sedation.  ANESTHESIOLOGIST:  Lorrene Reid, M.D.  HISTORY:  The patient is a 65 year old male with a history of bilateral carpal tunnel syndrome, EMG nerve conduction is positive.  He has diabetes, has declined having injections because that affects his sugar. He is desirous to proceeding for surgical release.  Pre, peri, and postoperative course had discussed along with risks and complications. He is aware that there is no guarantee with the surgery; possibility of infection; recurrence of injury to arteries, nerves, tendons; incomplete relief of symptoms and dystrophy.  In the preoperative area, the patient is seen, the extremity marked by both patient and surgeon, and antibiotic given.  DESCRIPTION OF PROCEDURE:  The patient was brought to the operating room, where a forearm-based IV regional anesthetic was carried out without difficulty.  He was prepped using ChloraPrep, supine position with the right arm free.  A 3-minute dry time was allowed, time-out taken confirming the patient and procedure.  A longitudinal incision was made in the right palm, carried down through the subcutaneous tissue. Bleeders were electrocauterized.  The palmar fascia was split. Superficial palmar arch identified.  The flexor tendon to the ring and little finger identified to the ulnar side of the median nerve.  The carpal  retinaculum was incised with sharp dissection.  Right angle and Sewall retractor were placed between the skin and forearm fascia.  The fascia was released for approximately 2-3 cm proximal to the wrist crease under direct vision.  Canal was explored.  Motor branch entered into muscle distally.  Area compression to the nerve was apparent.  No further lesions were identified.  The wound was irrigated and the skin closed with interrupted 4-0 nylon sutures.  A local infiltration with 0.25% bupivacaine without epinephrine was given, approximately 5 mL was used.  Sterile compressive dressing with the fingers free was applied.  On deflation of the tourniquet, all fingers were immediately pinked.  He was taken to the recovery room for observation in satisfactory condition.  He will be discharged to home to return to the [facility] in 1 week, on Vicodin.          ______________________________ Daryll Brod, M.D.     GK/MEDQ  D:  10/13/2015  T:  10/13/2015  Job:  [id number]

## 2015-10-13 NOTE — Discharge Instructions (Addendum)

## 2015-10-13 NOTE — Anesthesia Preprocedure Evaluation (Addendum)
Anesthesia Evaluation  Patient identified by MRN, date of birth, ID band Patient awake    Reviewed: Allergy & Precautions, NPO status , Patient's Chart, lab work & pertinent test results  Airway Mallampati: I  TM Distance: >3 FB Neck ROM: Full    Dental  (+) Teeth Intact, Dental Advisory Given   Pulmonary asthma , sleep apnea and Continuous Positive Airway Pressure Ventilation , former smoker,    breath sounds clear to auscultation       Cardiovascular hypertension, Pt. on medications  Rhythm:Regular Rate:Normal     Neuro/Psych PSYCHIATRIC DISORDERS Depression    GI/Hepatic GERD  Medicated and Controlled,  Endo/Other  diabetes, Well Controlled, Type 2, Insulin DependentMorbid obesity  Renal/GU      Musculoskeletal  (+) Arthritis ,   Abdominal   Peds  Hematology   Anesthesia Other Findings   Reproductive/Obstetrics                            Anesthesia Physical Anesthesia Plan  ASA: III  Anesthesia Plan: Bier Block and MAC   Post-op Pain Management:    Induction: Intravenous  Airway Management Planned: Simple Face Mask  Additional Equipment:   Intra-op Plan:   Post-operative Plan:   Informed Consent: I have reviewed the patients History and Physical, chart, labs and discussed the procedure including the risks, benefits and alternatives for the proposed anesthesia with the patient or authorized representative who has indicated his/her understanding and acceptance.   Dental advisory given  Plan Discussed with: CRNA, Anesthesiologist and Surgeon  Anesthesia Plan Comments:         Anesthesia Quick Evaluation

## 2015-10-13 NOTE — Brief Op Note (Signed)
10/13/2015  10:53 AM  PATIENT:  Tony Cantu  65 y.o. male  PRE-OPERATIVE DIAGNOSIS:  RIGHT CARPAL TUNNEL SYNDROME   POST-OPERATIVE DIAGNOSIS:  RIGHT CARPAL TUNNEL SYNDROME   PROCEDURE:  Procedure(s): RIGHT CARPAL TUNNEL RELEASE (Right)  SURGEON:  Surgeon(s) and Role:    * Daryll Brod, MD - Primary  PHYSICIAN ASSISTANT:   ASSISTANTS: none   ANESTHESIA:   local and regional  EBL:     BLOOD ADMINISTERED:none  DRAINS: none   LOCAL MEDICATIONS USED:  BUPIVICAINE   SPECIMEN:  No Specimen  DISPOSITION OF SPECIMEN:  N/A  COUNTS:  YES  TOURNIQUET:   Total Tourniquet Time Documented: Forearm (Right) - 28 minutes Total: Forearm (Right) - 28 minutes   DICTATION: .Other Dictation: Dictation Number [id number]  PLAN OF CARE: Discharge to home after PACU  PATIENT DISPOSITION:  PACU - hemodynamically stable.

## 2015-10-13 NOTE — Anesthesia Procedure Notes (Addendum)
Anesthesia Regional Block:  Bier block (IV Regional)  Pre-Anesthetic Checklist: ,, timeout performed, Correct Patient, Correct Site, Correct Laterality, Correct Procedure,, site marked, surgical consent,, at surgeon's request Needles:  Injection technique: Single-shot  Needle Type: Other      Needle Gauge: 20 and 20 G    Additional Needles: Bier block (IV Regional) Narrative:   Performed by: Personally    Procedure Name: MAC Date/Time: 10/13/2015 10:17 AM Performed by: Oval Moralez D Pre-anesthesia Checklist: Patient identified, Emergency Drugs available, Suction available, Patient being monitored and Timeout performed Patient Re-evaluated:Patient Re-evaluated prior to inductionOxygen Delivery Method: Simple face mask

## 2015-10-13 NOTE — Anesthesia Postprocedure Evaluation (Signed)
Anesthesia Post Note  Patient: Tony Cantu  Procedure(s) Performed: Procedure(s) (LRB): RIGHT CARPAL TUNNEL RELEASE (Right)  Patient location during evaluation: PACU Anesthesia Type: General Level of consciousness: awake and alert Pain management: pain level controlled Vital Signs Assessment: post-procedure vital signs reviewed and stable Respiratory status: spontaneous breathing, nonlabored ventilation and respiratory function stable Cardiovascular status: blood pressure returned to baseline and stable Postop Assessment: no signs of nausea or vomiting Anesthetic complications: no    Last Vitals:  Filed Vitals:   10/13/15 1115 10/13/15 1135  BP: 119/77 111/82  Pulse: 62 57  Temp:  36.9 C  Resp: 17 16    Last Pain: There were no vitals filed for this visit.               Blanchie Zeleznik A

## 2015-10-13 NOTE — Op Note (Signed)
Dictation Number [id number]

## 2015-10-13 NOTE — H&P (Signed)
Tony Cantu is an 65 y.o. male.   Chief Complaint:numbness both hands HPI: Tony Cantu is a 65 year old right-hand dominant male who comes in complaining of numbness and tingling in his right hand to a lesser extent his left side. This has been going on for at least two years. He had nerve conductions done by Dr. Jannifer Franklin in 2015 these ar personally reviewd revealing mild carpal tunnel. He has a history of cervical spine disc, which has been followed by the neurosurgeons in [place]. He has a history of a fracture of his right wrist in third to fourth grade. He states he is not awakened at night. He is wearing braces at night. He has a history of diabetes and arthritis. There is no history of thyroid problems or gout. He is not taking any medicines per se for this.He has had his nerve conductions performed by Dr. Ileene Rubens. These were performed on 02/02 revealing bilateral carpal tunnel syndrome, right greater than left with an ulnar nerve change, likely due to underlying peripheral neuropathy. His actual study is reviewed personally with a median nerve latency of 6.4 on the right side, 4.8 on the left, a motor latency of 5.6 on the right, 4.17 on the left.  Past Medical History  Diagnosis Date  . Diabetes mellitus type II, controlled ([facility])  . Sleep apnea   Past Surgical History  Procedure Laterality Date  . Shoulder arthroscopy    Past Medical History  Diagnosis Date  . GERD (gastroesophageal reflux disease)   . Colon polyps   . Gastritis   . IDDM (insulin dependent diabetes mellitus) ([place])   . Testicle trouble     both   . BPH (benign prostatic hypertrophy)   . Hypertension   . Sleep apnea     uses CPAP nightly  . Asthma   . Depression     no meds  . Arthritis     Past Surgical History  Procedure Laterality Date  . Tonsilectomy, adenoidectomy, bilateral myringotomy and tubes    . Lumbar disc surgery  8/96    Dr. Coralyn Mark, discectomy  . Inguinal hernia repair  2003    right   .  Shoulder surgery  11/28/05    left partial  . Shoulder surgery  07/14/2006    RIGHT  . Back surgery    . Hernia repair      Family History  Problem Relation Age of Onset  . Allergic rhinitis Sister   . Asthma Father   . Heart failure Maternal Grandmother   . Heart failure Mother   . Rheum arthritis Maternal Grandmother   . Breast cancer Maternal Grandmother   . Suicidality Father     in his 5's  . Heart attack Mother     in her 52's  . Heart attack Maternal Grandmother     in her 64's   Social History:  reports that he quit smoking about 14 years ago. His smoking use included Cigarettes and Pipe. He quit after 34 years of use. He has never used smokeless tobacco. He reports that he drinks alcohol. He reports that he does not use illicit drugs.  Allergies:  Allergies  Allergen Reactions  . Erythromycin   . Sulfonamide Derivatives     Medications Prior to Admission  Medication Sig Dispense Refill  . aspirin 325 MG EC tablet Take 325 mg by mouth daily.    Marland Kitchen atorvastatin (LIPITOR) 40 MG tablet TAKE 1 TABLET DAILY 90 tablet 1  . b complex vitamins  tablet Take 1 tablet by mouth daily.      . Calcium Carbonate (CALTRATE 600 PO) Take by mouth daily.      . Cholecalciferol (VITAMIN D3) 2000 UNITS capsule Take 2,000 Units by mouth daily.      Marland Kitchen EPIPEN 2-PAK 0.3 MG/0.3ML SOAJ injection     . fexofenadine (ALLEGRA) 180 MG tablet Take 180 mg by mouth daily.    . Flaxseed, Linseed, (FLAX SEED OIL PO) Take 1 capsule by mouth daily.    . furosemide (LASIX) 20 MG tablet Take 1.5 tablets (30 mg total) by mouth daily. 135 tablet 1  . Glucosamine-Chondroit-Vit C-Mn (GLUCOSAMINE 1500 COMPLEX) CAPS Take by mouth daily.      . insulin glargine (LANTUS) 100 UNIT/ML injection Inject 0.75 mLs (75 Units total) into the skin 2 (two) times daily. As directed 12 vial 3  . insulin lispro (HUMALOG) 100 UNIT/ML injection Inject 0.2-0.5 mLs (20-50 Units total) into the skin 3 (three) times daily before  meals. Sliding scale 12 vial 3  . montelukast (SINGULAIR) 10 MG tablet TAKE 1 TABLET AT BEDTIME 90 tablet 3  . MULTIPLE VITAMIN PO Take by mouth daily.    . niacin (NIASPAN) 500 MG CR tablet TAKE 1 TABLET DAILY 90 tablet 0  . pantoprazole (PROTONIX) 40 MG tablet TAKE 1 TABLET DAILY 90 tablet 1  . ranitidine (ZANTAC) 75 MG tablet Take 75 mg by mouth at bedtime.    . Testosterone (AXIRON) 30 MG/ACT SOLN Place 1 application onto the skin daily. Place 1 application under each axilla daily 270 mL 3  . traMADol (ULTRAM) 50 MG tablet Take 1 tablet (50 mg total) by mouth every 6 (six) hours as needed. 30 tablet 0  . valsartan (DIOVAN) 160 MG tablet TAKE 1 TABLET DAILY 90 tablet 1  . B-D ULTRA-FINE 33 LANCETS MISC Check Blood sugar 8 times daily and PRN.DX E11.9 800 each 3  . glucose blood (ONE TOUCH ULTRA TEST) test strip chesk BS q 2 hours PRN.dx.E11.9 800 each 3  . Insulin Syringe-Needle U-100 (BD INSULIN SYRINGE ULTRAFINE) 31G X 15/64" 1 ML MISC Inject 1 Syringe into the skin 2 (two) times daily. Dx.E11.9 180 each 3  . Insulin Syringe-Needle U-100 (BD INSULIN SYRINGE ULTRAFINE) 31G X 5/16" 0.5 ML MISC USE TO INJECT 8 TO 10 TIMES DAILY, dx E11.9 800 each 3    Results for orders placed or performed during the hospital encounter of 10/13/15 (from the past 48 hour(s))  Glucose, capillary     Status: None   Collection Time: 10/13/15  9:09 AM  Result Value Ref Range   Glucose-Capillary 89 65 - 99 mg/dL    No results found.   Pertinent items are noted in HPI.  Blood pressure 110/58, pulse 52, temperature 98.1 F (36.7 C), temperature source Oral, resp. rate 18, height 5' 10"  (1.778 m), weight 111.131 kg (245 lb), SpO2 99 %.  General appearance: alert, cooperative and appears stated age Head: Normocephalic, without obvious abnormality Neck: no JVD Resp: clear to auscultation bilaterally Cardio: regular rate and rhythm, S1, S2 normal, no murmur, click, rub or gallop GI: soft, non-tender; bowel  sounds normal; no masses,  no organomegaly Extremities: numbness both hands Pulses: 2+ and symmetric Skin: Skin color, texture, turgor normal. No rashes or lesions Neurologic: Grossly normal Incision/Wound: na  Assessment/Plan    DIAGNOSIS: Bilateral carpal tunnel syndrome, right greater than left. I discussed with him the possibility of injection. He has declined having injections done. He states that  these caused his sugars to significantly elevate. We have discussed the possibility of surgical intervention, which he would like to proceed with. Pre, peri and postoperative course have been discussed along with risks and complications. He is aware that there is no guarantee with the surgery, possibility of infection, recurrence, injury to arteries, nerves, tendons, incomplete relief of symptoms, dystrophy. He is scheduled for right carpal tunnel release as an outpatient under regional anesthesia at his request.   Tayja Manzer R 10/13/2015, 10:00 AM

## 2015-10-13 NOTE — Transfer of Care (Signed)
Immediate Anesthesia Transfer of Care Note  Patient: Tony Cantu  Procedure(s) Performed: Procedure(s): RIGHT CARPAL TUNNEL RELEASE (Right)  Patient Location: PACU  Anesthesia Type:Bier block  Level of Consciousness: awake, alert , oriented and patient cooperative  Airway & Oxygen Therapy: Patient Spontanous Breathing and Patient connected to face mask oxygen  Post-op Assessment: Report given to RN and Post -op Vital signs reviewed and stable  Post vital signs: Reviewed and stable  Last Vitals:  Filed Vitals:   10/13/15 0850  BP: 110/58  Pulse: 52  Temp: 36.7 C  Resp: 18    Complications: No apparent anesthesia complications

## 2015-10-14 ENCOUNTER — Encounter (HOSPITAL_BASED_OUTPATIENT_CLINIC_OR_DEPARTMENT_OTHER): Payer: Self-pay | Admitting: Orthopedic Surgery

## 2015-10-16 ENCOUNTER — Other Ambulatory Visit: Payer: Self-pay | Admitting: Family Medicine

## 2015-10-26 ENCOUNTER — Other Ambulatory Visit: Payer: Self-pay | Admitting: *Deleted

## 2015-10-26 MED ORDER — FUROSEMIDE 20 MG PO TABS: 30.0000 mg | ORAL_TABLET | Freq: Every day | ORAL | Status: DC

## 2015-10-26 MED ORDER — MONTELUKAST SODIUM 10 MG PO TABS: 10.0000 mg | ORAL_TABLET | Freq: Every day | ORAL | Status: DC

## 2015-10-26 MED ORDER — INSULIN LISPRO 100 UNIT/ML ~~LOC~~ SOLN: 20.0000 [IU] | Freq: Three times a day (TID) | SUBCUTANEOUS | Status: DC

## 2015-10-26 MED ORDER — PANTOPRAZOLE SODIUM 40 MG PO TBEC: 40.0000 mg | DELAYED_RELEASE_TABLET | Freq: Every day | ORAL | Status: DC

## 2015-10-26 MED ORDER — INSULIN GLARGINE 100 UNIT/ML ~~LOC~~ SOLN: 75.0000 [IU] | Freq: Two times a day (BID) | SUBCUTANEOUS | Status: DC

## 2015-10-26 MED ORDER — VALSARTAN 160 MG PO TABS: 160.0000 mg | ORAL_TABLET | Freq: Every day | ORAL | Status: DC

## 2015-10-26 MED ORDER — ATORVASTATIN CALCIUM 40 MG PO TABS: 40.0000 mg | ORAL_TABLET | Freq: Every day | ORAL | Status: DC

## 2015-10-26 MED ORDER — NIACIN ER (ANTIHYPERLIPIDEMIC) 500 MG PO TBCR: 500.0000 mg | EXTENDED_RELEASE_TABLET | Freq: Every day | ORAL | Status: DC

## 2015-11-05 ENCOUNTER — Other Ambulatory Visit: Payer: Self-pay | Admitting: Family Medicine

## 2015-11-15 ENCOUNTER — Other Ambulatory Visit: Payer: Self-pay | Admitting: Family Medicine

## 2015-11-24 ENCOUNTER — Encounter: Payer: Self-pay | Admitting: Family Medicine

## 2015-11-24 ENCOUNTER — Ambulatory Visit (INDEPENDENT_AMBULATORY_CARE_PROVIDER_SITE_OTHER): Payer: Medicare Other | Admitting: Family Medicine

## 2015-11-24 VITALS — BP 119/72 | HR 56 | Temp 97.5°F | Ht 70.0 in | Wt 254.6 lb

## 2015-11-24 DIAGNOSIS — R1030 Lower abdominal pain, unspecified: Secondary | ICD-10-CM

## 2015-11-24 DIAGNOSIS — R1031 Right lower quadrant pain: Secondary | ICD-10-CM

## 2015-11-24 NOTE — Progress Notes (Signed)
   HPI  Patient presents today here with concern for hernia.  Patient claims for one week she's had discomfort in the right groin MORE noticeable when he is up walking around and lifting items. He describes a dull achy discomfort. He has no firmness, redness, or severe pain. No fevers, chills, sweats, or bowel or bladder dysfunction. He does notice some constipation which seemed to perceive the event.  The discomfort and symptoms are in the same area as his previous right-sided inguinal hernia which has been operated on before.  PMH: Smoking status noted ROS: Per HPI  Objective: BP 119/72 mmHg  Pulse 56  Temp(Src) 97.5 F (36.4 C) (Oral)  Ht 5' 10"  (1.778 m)  Wt 254 lb 9.6 oz (115.486 kg)  BMI 36.53 kg/m2 Gen: NAD, alert, cooperative with exam HEENT: NCAT CV: RRR, good S1/S2, no murmur Resp: CTABL, no wheezes, non-labored Abd: SNTND, BS present, no guarding or organomegaly GU: Right groin with small defect felt in the abdominal Stegmaier, some tenderness to palpation in that area, no firmness, warmth, erythema, or severe pain to palpation Ext: No edema, warm Neuro: Alert and oriented, No gross deficits  Assessment and plan:  # R groin pain Likely inguinal hernia recurrence Ultrasound to confirm Likely referral to general surgery Discussed possibility of incarceration and reasons to seek [facility] care which he understands      Orders Placed This Encounter  Procedures  . US Pelvis Limited    Standing Status: Future     Number of Occurrences:      Standing Expiration Date: 01/23/2017    Order Specific Question:  Reason for Exam (SYMPTOM  OR DIAGNOSIS REQUIRED)    Answer:  R inguinal hernia evaluation    Order Specific Question:  Preferred imaging location?    Answer:  [place], MD [facility] 11/24/2015, 3:06 PM

## 2015-11-24 NOTE — Patient Instructions (Signed)
Great to see you!  We will work on the ultrasound  Hernia, Adult A hernia is the bulging of an organ or tissue through a weak spot in the muscles of the abdomen (abdominal Garriga). Hernias develop most often near the navel or groin. There are many kinds of hernias. Common kinds include:  Femoral hernia. This kind of hernia develops under the groin in the upper thigh area.  Inguinal hernia. This kind of hernia develops in the groin or scrotum.  Umbilical hernia. This kind of hernia develops near the navel.  Hiatal hernia. This kind of hernia causes part of the stomach to be pushed up into the chest.  Incisional hernia. This kind of hernia bulges through a scar from an abdominal surgery. CAUSES This condition may be caused by:  Heavy lifting.  Coughing over a long period of time.  Straining to have a bowel movement.  An incision made during an abdominal surgery.  A birth defect (congenital defect).  Excess weight or obesity.  Smoking.  Poor nutrition.  Cystic fibrosis.  Excess fluid in the abdomen.  Undescended testicles. SYMPTOMS Symptoms of a hernia include:  A lump on the abdomen. This is the first sign of a hernia. The lump may become more obvious with standing, straining, or coughing. It may get bigger over time if it is not treated or if the condition causing it is not treated.  Pain. A hernia is usually painless, but it may become painful over time if treatment is delayed. The pain is usually dull and may get worse with standing or lifting heavy objects. Sometimes a hernia gets tightly squeezed in the weak spot (strangulated) or stuck there (incarcerated) and causes additional symptoms. These symptoms may include:  Vomiting.  Nausea.  Constipation.  Irritability. DIAGNOSIS A hernia may be diagnosed with:  A physical exam. During the exam your health care provider may ask you to cough or to make a specific movement, because a hernia is usually more visible  when you move.  Imaging tests. These can include:  X-rays.  Ultrasound.  CT scan. TREATMENT A hernia that is small and painless may not need to be treated. A hernia that is large or painful may be treated with surgery. Inguinal hernias may be treated with surgery to prevent incarceration or strangulation. Strangulated hernias are always treated with surgery, because lack of blood to the trapped organ or tissue can cause it to die. Surgery to treat a hernia involves pushing the bulge back into place and repairing the weak part of the abdomen. HOME CARE INSTRUCTIONS  Avoid straining.  Do not lift anything heavier than 10 lb (4.5 kg).  Lift with your leg muscles, not your back muscles. This helps avoid strain.  When coughing, try to cough gently.  Prevent constipation. Constipation leads to straining with bowel movements, which can make a hernia worse or cause a hernia repair to break down. You can prevent constipation by:  Eating a high-fiber diet that includes plenty of fruits and vegetables.  Drinking enough fluids to keep your urine clear or pale yellow. Aim to drink 6-8 glasses of water per day.  Using a stool softener as directed by your health care provider.  Lose weight, if you are overweight.  Do not use any tobacco products, including cigarettes, chewing tobacco, or electronic cigarettes. If you need help quitting, ask your health care provider.  Keep all follow-up visits as directed by your health care provider. This is important. Your health care provider may  need to monitor your condition. SEEK MEDICAL CARE IF:  You have swelling, redness, and pain in the affected area.  Your bowel habits change. SEEK IMMEDIATE MEDICAL CARE IF:  You have a fever.  You have abdominal pain that is getting worse.  You feel nauseous or you vomit.  You cannot push the hernia back in place by gently pressing on it while you are lying down.  The hernia:  Changes in shape or  size.  Is stuck outside the abdomen.  Becomes discolored.  Feels hard or tender.   This information is not intended to replace advice given to you by your health care provider. Make sure you discuss any questions you have with your health care provider.   Document Released: 08/15/2005 Document Revised: 09/05/2014 Document Reviewed: 06/25/2014 Elsevier Interactive Patient Education [employer].

## 2015-11-26 ENCOUNTER — Telehealth: Payer: Self-pay

## 2015-11-26 NOTE — Telephone Encounter (Signed)
Pt aware of appointment date/time

## 2015-11-26 NOTE — Telephone Encounter (Signed)
[facility] to x-ray to give appointment date/time  Pt has an appointment tomorrow, Friday 11/27/2015 at [facility] for a pelvis U/S for hernia evaluaton. Pt needs to be at [facility] radiology department at 11:45 for 12 pm appointment time. No prep

## 2015-11-27 ENCOUNTER — Ambulatory Visit (HOSPITAL_COMMUNITY)
Admission: RE | Admit: 2015-11-27 | Discharge: 2015-11-27 | Disposition: A | Discharge status: Home / Self Care | Payer: Medicare Other | Source: Ambulatory Visit | Attending: Family Medicine | Admitting: Family Medicine

## 2015-11-27 DIAGNOSIS — R1030 Lower abdominal pain, unspecified: Secondary | ICD-10-CM | POA: Insufficient documentation

## 2015-11-27 DIAGNOSIS — R1031 Right lower quadrant pain: Secondary | ICD-10-CM

## 2015-11-27 DIAGNOSIS — R938 Abnormal findings on diagnostic imaging of other specified body structures: Secondary | ICD-10-CM | POA: Diagnosis not present

## 2015-11-30 ENCOUNTER — Other Ambulatory Visit: Payer: Self-pay | Admitting: Family Medicine

## 2015-11-30 DIAGNOSIS — R1031 Right lower quadrant pain: Secondary | ICD-10-CM

## 2015-12-01 ENCOUNTER — Encounter: Payer: Self-pay | Admitting: Family Medicine

## 2015-12-02 ENCOUNTER — Telehealth: Payer: Self-pay

## 2015-12-02 MED ORDER — AMOXICILLIN 500 MG PO CAPS: 500.0000 mg | ORAL_CAPSULE | Freq: Three times a day (TID) | ORAL | Status: DC

## 2015-12-02 NOTE — Telephone Encounter (Signed)
Tony Cantu thinks he may have a sinus infection, is running a fever, reports he gets septic very easily and Dr. Laurance Flatten normally calls in Amoxicillin when he starts.  Per Dr. Warrick Parisian, send in Amoxicilllin 500 mg bid x 10 days.  To [facility] per [place].  Medication sent in.

## 2015-12-04 ENCOUNTER — Encounter: Payer: Self-pay | Admitting: Family Medicine

## 2015-12-04 ENCOUNTER — Ambulatory Visit (INDEPENDENT_AMBULATORY_CARE_PROVIDER_SITE_OTHER): Payer: Medicare Other | Admitting: Family Medicine

## 2015-12-04 VITALS — BP 112/64 | HR 59 | Temp 97.7°F | Ht 70.0 in | Wt 253.8 lb

## 2015-12-04 DIAGNOSIS — R059 Cough, unspecified: Secondary | ICD-10-CM

## 2015-12-04 DIAGNOSIS — R0981 Nasal congestion: Secondary | ICD-10-CM

## 2015-12-04 DIAGNOSIS — R05 Cough: Secondary | ICD-10-CM

## 2015-12-04 LAB — VERITOR FLU A/B WAIVED
INFLUENZA A: NEGATIVE
Influenza B: NEGATIVE

## 2015-12-04 NOTE — Progress Notes (Signed)
   HPI  Patient presents today here with URI  Pt reports 4 days of cough, congestion, and chest tightness. He also has malaise but no sick contacts or body aches He was started on amox 2 days ago, not improving yet No ear or sinus pain.   No exertional chest pain or pleurisy Tolerating foods and fluids normally Mild dyspnea occasionally  PMH: Smoking status noted ROS: Per HPI  Objective: BP 112/64 mmHg  Pulse 59  Temp(Src) 97.7 F (36.5 C) (Oral)  Ht 5' 10"  (1.778 m)  Wt 253 lb 12.8 oz (115.123 kg)  BMI 36.42 kg/m2 Gen: NAD, alert, cooperative with exam HEENT: NCAT, TMs WNL BL, oropharynx clear, Nares clear CV: RRR Resp: CTABL, no wheezes, non-labored Ext: No edema, warm Neuro: Alert and oriented, No gross deficits  Assessment and plan:  # Acute bronchitis With background of asthma, no wheezing ON amox, continue Checking for flu- pending Dyspnea not severe, lung exam reassuring without wheezing, low likelihood of lobar PNA RTC with any worsening symps     Orders Placed This Encounter  Procedures  . Veritor Flu A/B Waived    Order Specific Question:  Source    Answer:  nose    Laroy Apple, MD [facility] 12/04/2015, 11:40 AM

## 2015-12-10 ENCOUNTER — Encounter: Payer: Self-pay | Admitting: Family Medicine

## 2015-12-10 DIAGNOSIS — K4090 Unilateral inguinal hernia, without obstruction or gangrene, not specified as recurrent: Secondary | ICD-10-CM | POA: Insufficient documentation

## 2015-12-14 ENCOUNTER — Other Ambulatory Visit: Payer: Self-pay | Admitting: General Surgery

## 2015-12-14 DIAGNOSIS — Z96612 Presence of left artificial shoulder joint: Secondary | ICD-10-CM | POA: Diagnosis not present

## 2015-12-14 DIAGNOSIS — G4730 Sleep apnea, unspecified: Secondary | ICD-10-CM | POA: Diagnosis not present

## 2015-12-14 DIAGNOSIS — Z9889 Other specified postprocedural states: Secondary | ICD-10-CM | POA: Diagnosis not present

## 2015-12-14 DIAGNOSIS — R1030 Lower abdominal pain, unspecified: Secondary | ICD-10-CM | POA: Diagnosis not present

## 2015-12-14 DIAGNOSIS — Z8719 Personal history of other diseases of the digestive system: Secondary | ICD-10-CM | POA: Diagnosis not present

## 2015-12-14 DIAGNOSIS — I10 Essential (primary) hypertension: Secondary | ICD-10-CM | POA: Diagnosis not present

## 2015-12-14 DIAGNOSIS — E119 Type 2 diabetes mellitus without complications: Secondary | ICD-10-CM | POA: Diagnosis not present

## 2015-12-14 DIAGNOSIS — Z794 Long term (current) use of insulin: Secondary | ICD-10-CM | POA: Diagnosis not present

## 2015-12-14 DIAGNOSIS — Z96611 Presence of right artificial shoulder joint: Secondary | ICD-10-CM | POA: Diagnosis not present

## 2015-12-14 DIAGNOSIS — Z6836 Body mass index (BMI) 36.0-36.9, adult: Secondary | ICD-10-CM | POA: Diagnosis not present

## 2015-12-15 ENCOUNTER — Other Ambulatory Visit: Payer: Self-pay | Admitting: General Surgery

## 2015-12-15 DIAGNOSIS — R1031 Right lower quadrant pain: Secondary | ICD-10-CM

## 2015-12-21 ENCOUNTER — Ambulatory Visit
Admission: RE | Admit: 2015-12-21 | Discharge: 2015-12-21 | Disposition: A | Discharge status: Home / Self Care | Payer: Medicare Other | Source: Ambulatory Visit | Attending: General Surgery | Admitting: General Surgery

## 2015-12-21 DIAGNOSIS — K5730 Diverticulosis of large intestine without perforation or abscess without bleeding: Secondary | ICD-10-CM | POA: Diagnosis not present

## 2015-12-21 DIAGNOSIS — R1031 Right lower quadrant pain: Secondary | ICD-10-CM

## 2015-12-21 MED ORDER — IOPAMIDOL (ISOVUE-300) INJECTION 61%
125.0000 mL | Freq: Once | INTRAVENOUS | Status: AC | PRN
  Administered 2015-12-21: 125 mL via INTRAVENOUS

## 2015-12-29 ENCOUNTER — Encounter: Payer: Self-pay | Admitting: Family Medicine

## 2016-01-05 ENCOUNTER — Other Ambulatory Visit: Payer: Medicare Other

## 2016-01-05 DIAGNOSIS — E785 Hyperlipidemia, unspecified: Secondary | ICD-10-CM | POA: Diagnosis not present

## 2016-01-05 DIAGNOSIS — N182 Chronic kidney disease, stage 2 (mild): Secondary | ICD-10-CM | POA: Diagnosis not present

## 2016-01-05 DIAGNOSIS — I10 Essential (primary) hypertension: Secondary | ICD-10-CM

## 2016-01-05 DIAGNOSIS — E1122 Type 2 diabetes mellitus with diabetic chronic kidney disease: Secondary | ICD-10-CM | POA: Diagnosis not present

## 2016-01-06 ENCOUNTER — Encounter: Payer: Self-pay | Admitting: Family Medicine

## 2016-01-06 LAB — BMP8+EGFR
BUN / CREAT RATIO: 16 (ref 10–24)
BUN: 13 mg/dL (ref 8–27)
CALCIUM: 9.6 mg/dL (ref 8.6–10.2)
CHLORIDE: 99 mmol/L (ref 96–106)
CO2: 26 mmol/L (ref 18–29)
Creatinine, Ser: 0.83 mg/dL (ref 0.76–1.27)
GFR, EST AFRICAN AMERICAN: 106 mL/min/{1.73_m2} (ref >59)
GFR, EST NON AFRICAN AMERICAN: 92 mL/min/{1.73_m2} (ref >59)
Glucose: 84 mg/dL (ref 65–99)
POTASSIUM: 4.1 mmol/L (ref 3.5–5.2)
Sodium: 141 mmol/L (ref 134–144)

## 2016-01-06 LAB — CBC WITH DIFFERENTIAL/PLATELET
Basophils Absolute: 0 10*3/uL (ref 0.0–0.2)
Basos: 0 %
EOS (ABSOLUTE): 0.2 10*3/uL (ref 0.0–0.4)
EOS: 3 %
HEMATOCRIT: 49.2 % (ref 37.5–51.0)
HEMOGLOBIN: 16.4 g/dL (ref 12.6–17.7)
IMMATURE GRANS (ABS): 0 10*3/uL (ref 0.0–0.1)
IMMATURE GRANULOCYTES: 1 %
LYMPHS: 27 %
Lymphocytes Absolute: 1.5 10*3/uL (ref 0.7–3.1)
MCH: 34.8 pg — ABNORMAL HIGH (ref 26.6–33.0)
MCHC: 33.3 g/dL (ref 31.5–35.7)
MCV: 105 fL — AB (ref 79–97)
MONOCYTES: 18 %
MONOS ABS: 1 10*3/uL — AB (ref 0.1–0.9)
NEUTROS PCT: 51 %
Neutrophils Absolute: 3 10*3/uL (ref 1.4–7.0)
Platelets: 148 10*3/uL — ABNORMAL LOW (ref 150–379)
RBC: 4.71 x10E6/uL (ref 4.14–5.80)
RDW: 13.2 % (ref 12.3–15.4)
WBC: 5.8 10*3/uL (ref 3.4–10.8)

## 2016-01-06 LAB — NMR, LIPOPROFILE
Cholesterol: 126 mg/dL (ref 100–199)
HDL CHOLESTEROL BY NMR: 57 mg/dL (ref >39)
HDL Particle Number: 33.7 umol/L (ref >=30.5)
LDL Particle Number: 578 nmol/L (ref <1000)
LDL Size: 20.9 nm (ref >20.5)
LDL-C: 52 mg/dL (ref 0–99)
LP-IR Score: 30 (ref <=45)
SMALL LDL PARTICLE NUMBER: 172 nmol/L (ref <=527)
TRIGLYCERIDES BY NMR: 86 mg/dL (ref 0–149)

## 2016-01-06 LAB — HEPATIC FUNCTION PANEL
ALBUMIN: 4.2 g/dL (ref 3.6–4.8)
ALK PHOS: 80 IU/L (ref 39–117)
ALT: 30 IU/L (ref 0–44)
AST: 32 IU/L (ref 0–40)
BILIRUBIN TOTAL: 0.7 mg/dL (ref 0.0–1.2)
BILIRUBIN, DIRECT: 0.24 mg/dL (ref 0.00–0.40)
Total Protein: 7.3 g/dL (ref 6.0–8.5)

## 2016-01-07 LAB — HGB A1C W/O EAG: Hgb A1c MFr Bld: 5.2 % (ref 4.8–5.6)

## 2016-01-07 LAB — SPECIMEN STATUS REPORT

## 2016-01-18 ENCOUNTER — Ambulatory Visit (INDEPENDENT_AMBULATORY_CARE_PROVIDER_SITE_OTHER): Payer: Medicare Other | Admitting: Family Medicine

## 2016-01-18 ENCOUNTER — Ambulatory Visit (INDEPENDENT_AMBULATORY_CARE_PROVIDER_SITE_OTHER): Payer: Medicare Other

## 2016-01-18 ENCOUNTER — Encounter: Payer: Self-pay | Admitting: Family Medicine

## 2016-01-18 VITALS — BP 108/67 | HR 66 | Temp 98.2°F | Ht 70.0 in | Wt 256.2 lb

## 2016-01-18 DIAGNOSIS — E1169 Type 2 diabetes mellitus with other specified complication: Secondary | ICD-10-CM | POA: Insufficient documentation

## 2016-01-18 DIAGNOSIS — R0989 Other specified symptoms and signs involving the circulatory and respiratory systems: Secondary | ICD-10-CM | POA: Diagnosis not present

## 2016-01-18 DIAGNOSIS — I10 Essential (primary) hypertension: Secondary | ICD-10-CM

## 2016-01-18 DIAGNOSIS — E785 Hyperlipidemia, unspecified: Secondary | ICD-10-CM

## 2016-01-18 DIAGNOSIS — D696 Thrombocytopenia, unspecified: Secondary | ICD-10-CM | POA: Diagnosis not present

## 2016-01-18 DIAGNOSIS — M25561 Pain in right knee: Secondary | ICD-10-CM | POA: Diagnosis not present

## 2016-01-18 DIAGNOSIS — E559 Vitamin D deficiency, unspecified: Secondary | ICD-10-CM

## 2016-01-18 NOTE — Patient Instructions (Signed)
We will schedule an appointment with the orthopedic surgeon in [place] to further evaluate your knee pain He will have access to the x-rays we have done here of the knee Continue to monitor blood sugars closely and keep a close check on your feet Always wear shoes that have plenty of wet Follow-up with cardiology as planned and continue with current treatment Continue to get eye exams yearly

## 2016-01-18 NOTE — Progress Notes (Signed)
Subjective:    Patient ID: Tony Cantu, male    DOB: 1951-03-30, 65 y.o.   MRN: [id number]  HPI Patient is here today for a 4 month follow up on his diabetes, hypertension, hyperlipidemia. Patient is also complaining with right knee pain for a couple of years. The patient has had recent blood work and this will be reviewed with him during the visit today. All cholesterol numbers renal function and electrolytes and hemoglobin A1c were excellent. Specifically his A1c was 5.2%. His hemoglobin was good at 16.4 with a normal white blood cell count. The patient is doing well overall. He is followed by cardiology and a physician assistant there on a regular basis yearly and says that he is due to get repeated carotid Dopplers. He does have occasional chest tightness but associates this with some allergy symptoms he is had this spring. He denies any chest pain or shortness of breath. He does have occasional indigestion and heartburn. He has not seen any blood in the stool or had any black tarry bowel movements. He is had no nausea or vomiting or diarrhea. He is passing his water without problems and has no blood in the urine. It is important to note that he has a sister who has ovarian cancer and a brother who currently has lung cancer that was a smoker. He is up-to-date on his eye exam.   Review of Systems  Constitutional: Negative.   HENT: Negative.   Eyes: Negative.   Respiratory: Negative.   Cardiovascular: Negative.   Gastrointestinal: Negative.   Endocrine: Negative.   Genitourinary: Negative.   Musculoskeletal:       Right knee pain.   Skin: Negative.   Allergic/Immunologic: Negative.   Neurological: Negative.   Hematological: Negative.   Psychiatric/Behavioral: Negative.        Patient Active Problem List   Diagnosis Date Noted  . Inguinal hernia 12/10/2015  . Right groin pain 11/30/2015  . Thrombocytopenia ([place]) 07/21/2014  . Bilateral carotid bruits 05/11/2014  . Vitamin D  deficiency 09/17/2013  . BPH (benign prostatic hyperplasia) 05/22/2013  . 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 Diabetes type 2, controlled ([place]) 01/10/2013  . Essential hypertension, benign 01/10/2013  . OSA (obstructive sleep apnea) 05/16/2012  . Edema 02/21/2012  . At risk for coronary artery disease 03/20/2011  . Obesity 03/20/2011  . WEIGHT GAIN, ABNORMAL 11/09/2010  . Hyperlipidemia 06/22/2010  . ALLERGIC RHINITIS 06/22/2010  . ASTHMA 06/22/2010  . COUGH 06/22/2010   Outpatient Encounter Prescriptions as of 01/18/2016  Medication Sig  . aspirin 325 MG EC tablet Take 325 mg by mouth daily.  Marland Kitchen atorvastatin (LIPITOR) 40 MG tablet Take 1 tablet (40 mg total) by mouth daily.  Marland Kitchen b complex vitamins tablet Take 1 tablet by mouth daily.    . B-D ULTRA-FINE 33 LANCETS MISC Check Blood sugar 8 times daily and PRN.DX E11.9  . Calcium Carbonate (CALTRATE 600 PO) Take by mouth daily.    . Cholecalciferol (VITAMIN D3) 2000 UNITS capsule Take 2,000 Units by mouth daily.    Marland Kitchen EPIPEN 2-PAK 0.3 MG/0.3ML SOAJ injection   . fexofenadine (ALLEGRA) 180 MG tablet Take 180 mg by mouth daily.  . Flaxseed, Linseed, (FLAX SEED OIL PO) Take 1 capsule by mouth daily.  . furosemide (LASIX) 20 MG tablet Take 1.5 tablets (30 mg total) by mouth daily.  . Glucosamine-Chondroit-Vit C-Mn (GLUCOSAMINE 1500 COMPLEX) CAPS Take by mouth daily.    Marland Kitchen glucose blood (ONE TOUCH ULTRA TEST) test  strip chesk BS q 2 hours PRN.dx.E11.9  . insulin glargine (LANTUS) 100 UNIT/ML injection Inject 0.75 mLs (75 Units total) into the skin 2 (two) times daily. As directed  . insulin lispro (HUMALOG) 100 UNIT/ML injection Inject 0.2-0.5 mLs (20-50 Units total) into the skin 3 (three) times daily before meals. Sliding scale  . Insulin Syringe-Needle U-100 (BD INSULIN SYRINGE ULTRAFINE) 31G X 15/64" 1 ML MISC Inject 1 Syringe into the skin 2 (two) times daily. Dx.E11.9  . Insulin Syringe-Needle U-100 (BD INSULIN SYRINGE ULTRAFINE) 31G  X 5/16" 0.5 ML MISC USE TO INJECT 8 TO 10 TIMES DAILY, dx E11.9  . montelukast (SINGULAIR) 10 MG tablet Take 1 tablet (10 mg total) by mouth at bedtime.  . MULTIPLE VITAMIN PO Take by mouth daily.  . niacin (NIASPAN) 500 MG CR tablet Take 1 tablet (500 mg total) by mouth daily.  . pantoprazole (PROTONIX) 40 MG tablet Take 1 tablet (40 mg total) by mouth daily.  . ranitidine (ZANTAC) 75 MG tablet Take 75 mg by mouth at bedtime.  . Testosterone (AXIRON) 30 MG/ACT SOLN Place 1 application onto the skin daily. Place 1 application under each axilla daily  . traMADol (ULTRAM) 50 MG tablet Take 1 tablet (50 mg total) by mouth every 6 (six) hours as needed.  . valsartan (DIOVAN) 160 MG tablet TAKE 1 TABLET DAILY  . [DISCONTINUED] amoxicillin (AMOXIL) 500 MG capsule Take 1 capsule (500 mg total) by mouth 3 (three) times daily.  . [DISCONTINUED] HYDROcodone-acetaminophen (NORCO) 5-325 MG tablet Take 1 tablet by mouth every 6 (six) hours as needed for moderate pain. (Patient not taking: Reported on 01/18/2016)   No facility-administered encounter medications on file as of 01/18/2016.        Objective:   Physical Exam  Constitutional: He is oriented to person, place, and time. He appears well-developed and well-nourished. No distress.  HENT:  Head: Normocephalic and atraumatic.  Right Ear: External ear normal.  Left Ear: External ear normal.  Nose: Nose normal.  Mouth/Throat: Oropharynx is clear and moist. No oropharyngeal exudate.  Eyes: Conjunctivae and EOM are normal. Pupils are equal, round, and reactive to light. Right eye exhibits no discharge. Left eye exhibits no discharge. No scleral icterus.  Neck: Normal range of motion. Neck supple. No thyromegaly present.  No carotid bruits were auscultated today  Cardiovascular: Normal rate, regular rhythm, normal heart sounds and intact distal pulses.   No murmur heard. At 72/m  Pulmonary/Chest: Effort normal and breath sounds normal. No respiratory  distress. He has no wheezes. He has no rales. He exhibits no tenderness.  Clear anteriorly and posteriorly  Abdominal: Soft. Bowel sounds are normal. He exhibits no mass. There is no tenderness. There is no rebound and no guarding.  Abdominal obesity without masses tenderness or organ enlargement  Musculoskeletal: Normal range of motion. He exhibits no edema or tenderness.  Lymphadenopathy:    He has no cervical adenopathy.  Neurological: He is alert and oriented to person, place, and time. He has normal reflexes. No cranial nerve deficit.  Skin: Skin is warm and dry. No rash noted.  Psychiatric: He has a normal mood and affect. His behavior is normal. Judgment and thought content normal.  Nursing note and vitals reviewed.  BP 108/67 mmHg  Pulse 66  Temp(Src) 98.2 F (36.8 C) (Oral)  Ht 5' 10"  (1.778 m)  Wt 256 lb 3.2 oz (116.212 kg)  BMI 36.76 kg/m2  [facility] reading (PRIMARY) by  Dr.Jaymi Tinner-x-ray of chest and right knee  are pending                                        Assessment & Plan:  1. Right knee pain -No treatment provided today with stay away from NSAIDs in this patient. - DG Knee 1-2 Views Right; Future - Ambulatory referral to Orthopedic Surgery  2. Essential hypertension -The blood pressure is good today and he will continue with current treatment - DG Chest 2 View; Future  3. Thrombocytopenia (HCC) -The platelet count is only minimally decreased and we will continue to monitor this.  4. Bilateral carotid bruits -He is been told that he has carotid bruits but I was not able to auscultate these today. He apparently is scheduled for carotid Dopplers with his next visit with the cardiologist.  5. Vitamin D deficiency -Continue with current treatment  6. Hyperlipidemia -All cholesterol numbers were excellent and he will continue with current treatment  7. Type 2 diabetes mellitus with hyperlipidemia (HCC) -The patient's hemoglobin A1c was excellent he will  continue with current treatment  8. Essential hypertension, benign -Blood pressure is excellent he will continue with current treatment  Patient Instructions  We will schedule an appointment with the orthopedic surgeon in [facility] to further evaluate your knee pain He will have access to the x-rays we have done here of the knee Continue to monitor blood sugars closely and keep a close check on your feet Always wear shoes that have plenty of wet Follow-up with cardiology as planned and continue with current treatment Continue to get eye exams yearly   Arrie Senate MD

## 2016-01-19 ENCOUNTER — Encounter: Payer: Self-pay | Admitting: Family Medicine

## 2016-01-21 ENCOUNTER — Encounter: Payer: Self-pay | Admitting: Family Medicine

## 2016-01-22 ENCOUNTER — Encounter: Payer: Self-pay | Admitting: Pulmonary Disease

## 2016-01-22 ENCOUNTER — Ambulatory Visit (INDEPENDENT_AMBULATORY_CARE_PROVIDER_SITE_OTHER): Payer: Medicare Other | Admitting: Pulmonary Disease

## 2016-01-22 VITALS — BP 124/72 | HR 55 | Temp 97.6°F | Ht 70.0 in | Wt 258.2 lb

## 2016-01-22 DIAGNOSIS — J984 Other disorders of lung: Secondary | ICD-10-CM | POA: Diagnosis not present

## 2016-01-22 DIAGNOSIS — G4733 Obstructive sleep apnea (adult) (pediatric): Secondary | ICD-10-CM | POA: Diagnosis not present

## 2016-01-22 DIAGNOSIS — J4530 Mild persistent asthma, uncomplicated: Secondary | ICD-10-CM

## 2016-01-22 DIAGNOSIS — R0602 Shortness of breath: Secondary | ICD-10-CM | POA: Diagnosis not present

## 2016-01-22 NOTE — Assessment & Plan Note (Addendum)
Asthma well controlled Plan: Spirometry in the office today. Continue taking Singulair as maintenance Continue using Allegra Continue Flonase  Please contact office for sooner follow up if symptoms do not improve or worsen or seek emergency care

## 2016-01-22 NOTE — Assessment & Plan Note (Signed)
Mild OSA: Doing well on CPAP 100% compliant AHI=2.4 No daytime sleepiness Plan:   Continue on CPAP at bedtime. You appear to be benefiting from the treatment Goal is to wear for at least 4-6 hours each night for maximal clinical benefit. Continue to work on weight loss, as the link between excess weight  and sleep apnea is well established.  Do not drive if sleepy. Follow up with Dr. Halford Chessman In 1 year or before as needed.

## 2016-01-22 NOTE — Progress Notes (Signed)
History of Present Illness Tony Cantu is a 65 y.o. male with OSA on CPAP. He is a previous Dr. Gwenette Greet patient now seen by Dr. Halford Chessman.   01/22/2016 1 Year CPAP Follow Up: Pt. States he is doing well on his CPAP. He is 100% compliant. No daytime sleepiness, sleeping well with the exception of his right knee pain. He is going to have follow up with ortho  June 8th.He states his mask has good fit and seal.Pressures are comfortable. He takes generic Singulair for his asthma.  Download: 100 % compliant AHI: 2.4 APAP 5-20 cm H2O   Past medical hx Past Medical History  Diagnosis Date  . GERD (gastroesophageal reflux disease)   . Colon polyps   . Gastritis   . IDDM (insulin dependent diabetes mellitus) ([place])   . Testicle trouble     both   . BPH (benign prostatic hypertrophy)   . Hypertension   . Sleep apnea     uses CPAP nightly  . Asthma   . Depression     no meds  . Arthritis      Past surgical hx, Family hx, Social hx all reviewed.  Current Outpatient Prescriptions on File Prior to Visit  Medication Sig  . aspirin 325 MG EC tablet Take 325 mg by mouth daily.  Marland Kitchen atorvastatin (LIPITOR) 40 MG tablet Take 1 tablet (40 mg total) by mouth daily.  Marland Kitchen b complex vitamins tablet Take 1 tablet by mouth daily.    . B-D ULTRA-FINE 33 LANCETS MISC Check Blood sugar 8 times daily and PRN.DX E11.9  . Calcium Carbonate (CALTRATE 600 PO) Take by mouth daily.    . Cholecalciferol (VITAMIN D3) 2000 UNITS capsule Take 2,000 Units by mouth daily.    Marland Kitchen EPIPEN 2-PAK 0.3 MG/0.3ML SOAJ injection   . fexofenadine (ALLEGRA) 180 MG tablet Take 180 mg by mouth daily.  . Flaxseed, Linseed, (FLAX SEED OIL PO) Take 1 capsule by mouth daily.  . furosemide (LASIX) 20 MG tablet Take 1.5 tablets (30 mg total) by mouth daily.  . Glucosamine-Chondroit-Vit C-Mn (GLUCOSAMINE 1500 COMPLEX) CAPS Take by mouth daily.    Marland Kitchen glucose blood (ONE TOUCH ULTRA TEST) test strip chesk BS q 2 hours PRN.dx.E11.9  . insulin  glargine (LANTUS) 100 UNIT/ML injection Inject 0.75 mLs (75 Units total) into the skin 2 (two) times daily. As directed  . insulin lispro (HUMALOG) 100 UNIT/ML injection Inject 0.2-0.5 mLs (20-50 Units total) into the skin 3 (three) times daily before meals. Sliding scale  . Insulin Syringe-Needle U-100 (BD INSULIN SYRINGE ULTRAFINE) 31G X 15/64" 1 ML MISC Inject 1 Syringe into the skin 2 (two) times daily. Dx.E11.9  . Insulin Syringe-Needle U-100 (BD INSULIN SYRINGE ULTRAFINE) 31G X 5/16" 0.5 ML MISC USE TO INJECT 8 TO 10 TIMES DAILY, dx E11.9  . montelukast (SINGULAIR) 10 MG tablet Take 1 tablet (10 mg total) by mouth at bedtime.  . MULTIPLE VITAMIN PO Take by mouth daily.  . niacin (NIASPAN) 500 MG CR tablet Take 1 tablet (500 mg total) by mouth daily.  . pantoprazole (PROTONIX) 40 MG tablet Take 1 tablet (40 mg total) by mouth daily.  . ranitidine (ZANTAC) 75 MG tablet Take 75 mg by mouth at bedtime.  . Testosterone (AXIRON) 30 MG/ACT SOLN Place 1 application onto the skin daily. Place 1 application under each axilla daily  . traMADol (ULTRAM) 50 MG tablet Take 1 tablet (50 mg total) by mouth every 6 (six) hours as needed.  Marland Kitchen  valsartan (DIOVAN) 160 MG tablet TAKE 1 TABLET DAILY   No current facility-administered medications on file prior to visit.     Allergies  Allergen Reactions  . Erythromycin   . Sulfonamide Derivatives     Review Of Systems:  Constitutional:   No  weight loss, night sweats,  Fevers, chills, fatigue, or  lassitude.  HEENT:   No headaches,  Difficulty swallowing,  Tooth/dental problems, or  Sore throat,                No sneezing, itching, ear ache, nasal congestion, post nasal drip,   CV:  No chest pain,  Orthopnea, PND, swelling in lower extremities, anasarca, dizziness, palpitations, syncope.   GI  No heartburn, indigestion, abdominal pain, nausea, vomiting, diarrhea, change in bowel habits, loss of appetite, bloody stools.   Resp: No shortness of breath  with exertion or at rest.  No excess mucus, no productive cough,  No non-productive cough,  No coughing up of blood.  No change in color of mucus.  No wheezing.  No chest Deveney deformity  Skin: no rash or lesions.  GU: no dysuria, change in color of urine, no urgency or frequency.  No flank pain, no hematuria   MS:  + right knee joint pain or swelling.  No decreased range of motion.  No back pain.  Psych:  No change in mood or affect. No depression or anxiety.  No memory loss.   Vital Signs BP 124/72 mmHg  Pulse 55  Temp(Src) 97.6 F (36.4 C) (Oral)  Ht 5' 10"  (1.778 m)  Wt 258 lb 3.2 oz (117.119 kg)  BMI 37.05 kg/m2  SpO2 96%   Physical Exam:  General- No distress,  A&Ox3, pleasant ENT: No sinus tenderness, TM clear, pale nasal mucosa, no oral exudate,occasional post nasal drip, no LAN Cardiac: S1, S2, regular rate and rhythm, no murmur Chest: No wheeze/ rales/ dullness; no accessory muscle use, no nasal flaring, no sternal retractions Abd.: Soft Non-tender Ext: No clubbing cyanosis, edema Neuro:  normal strength Skin: No rashes, warm and dry Psych: normal mood and behavior   Assessment/Plan  OSA (obstructive sleep apnea) Mild OSA: Doing well on CPAP 100% compliant AHI=2.4 No daytime sleepiness Plan:   Continue on CPAP at bedtime. You appear to be benefiting from the treatment Goal is to wear for at least 4-6 hours each night for maximal clinical benefit. Continue to work on weight loss, as the link between excess weight  and sleep apnea is well established.  Do not drive if sleepy. Follow up with Dr. Halford Chessman In 1 year or before as needed.   ASTHMA Asthma well controlled Plan: Continue taking Singulair as maintenance Continue using Allegra Continue Flonase  Please contact office for sooner follow up if symptoms do not improve or worsen or seek emergency care      Magdalen Spatz, NP 01/22/2016  2:45 PM   He has been doing well with CPAP.  No further  issues with mask fit.  Breathing well.  No cough, wheeze, or sinus congestion.  MP 3, no sinus tenderness, HR regular, no wheeze.  Tests Echo 02/27/12 >> mild LVH, grade 1 diastolic CHF PSG 1/96/22 >> 76 Auto CPAP 10/23/15 to 01/20/16 >> used on 90 of 90 nights with average 7 hrs 20 min.  Average AHI 2.4 with median CPAP 14 and 95 th percentile CPAP 17 cm H2O Spirometry 01/22/16 >> FEV1 2.22 (62%), FVC 2.68 (58%), FEV1% 83   Dg Chest 2  View  01/18/2016  CLINICAL DATA:  Essential hypertension. 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 There are old left upper rib fractures. Both lungs are clear without airspace disease or edema. Few prominent lung markings at the left lung base are chronic. Heart and mediastinum are within normal limits. Trachea is midline. Mild degenerative changes in the thoracic spine. No pleural effusions. IMPRESSION: No active cardiopulmonary disease. Electronically Signed   By: Markus Daft M.D.   On: 01/18/2016 09:02   Dg Knee 1-2 Views Right  01/18/2016  CLINICAL DATA:  Old injury.  Continued pain . EXAM: RIGHT KNEE - 1-2 VIEW COMPARISON:  None. FINDINGS: No acute bony or joint abnormality identified. No evidence of fracture or dislocation. IMPRESSION: No acute or focal abnormality. Electronically Signed   By: Marcello Moores  Register   On: 01/18/2016 09:02    Assessment/plan:  Obstructive sleep apnea. - continue auto CPAP  Asthma, allergies. 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 - will arrange for full PFTs to further assess   Chesley Mires, MD [place] 01/22/2016, 3:01 PM Pager:  [phone]

## 2016-01-22 NOTE — Patient Instructions (Addendum)
Will schedule pulmonary function test and call with results  Follow up in 1 year

## 2016-02-04 DIAGNOSIS — M2391 Unspecified internal derangement of right knee: Secondary | ICD-10-CM | POA: Diagnosis not present

## 2016-02-04 DIAGNOSIS — M25561 Pain in right knee: Secondary | ICD-10-CM | POA: Diagnosis not present

## 2016-02-05 ENCOUNTER — Encounter: Payer: Self-pay | Admitting: Family Medicine

## 2016-02-08 ENCOUNTER — Encounter: Payer: Self-pay | Admitting: Pulmonary Disease

## 2016-02-09 ENCOUNTER — Ambulatory Visit (HOSPITAL_COMMUNITY)
Admission: RE | Admit: 2016-02-09 | Discharge: 2016-02-09 | Disposition: A | Discharge status: Home / Self Care | Payer: Medicare Other | Source: Ambulatory Visit | Attending: Pulmonary Disease | Admitting: Pulmonary Disease

## 2016-02-09 DIAGNOSIS — J984 Other disorders of lung: Secondary | ICD-10-CM

## 2016-02-09 DIAGNOSIS — J4530 Mild persistent asthma, uncomplicated: Secondary | ICD-10-CM

## 2016-02-09 LAB — PULMONARY FUNCTION TEST
DL/VA % pred: 114 %
DL/VA: 5.28 ml/min/mmHg/L
DLCO UNC % PRED: 78 %
DLCO UNC: 25.46 ml/min/mmHg
FEF 25-75 PRE: 2.7 L/s
FEF 25-75 Post: 2.26 L/sec
FEF2575-%Change-Post: -16 %
FEF2575-%PRED-POST: 84 %
FEF2575-%Pred-Pre: 101 %
FEV1-%CHANGE-POST: -6 %
FEV1-%PRED-POST: 65 %
FEV1-%PRED-PRE: 70 %
FEV1-POST: 2.22 L
FEV1-Pre: 2.38 L
FEV1FVC-%Change-Post: 1 %
FEV1FVC-%PRED-PRE: 113 %
FEV6-%Change-Post: -8 %
FEV6-%PRED-POST: 59 %
FEV6-%PRED-PRE: 65 %
FEV6-POST: 2.59 L
FEV6-Pre: 2.83 L
FEV6FVC-%PRED-POST: 105 %
FEV6FVC-%PRED-PRE: 105 %
FVC-%Change-Post: -8 %
FVC-%PRED-PRE: 62 %
FVC-%Pred-Post: 56 %
FVC-POST: 2.59 L
FVC-Pre: 2.83 L
POST FEV6/FVC RATIO: 100 %
PRE FEV6/FVC RATIO: 100 %
Post FEV1/FVC ratio: 86 %
Pre FEV1/FVC ratio: 84 %
RV % PRED: 85 %
RV: 2.04 L
TLC % PRED: 70 %
TLC: 4.96 L

## 2016-02-09 MED ORDER — ALBUTEROL SULFATE (2.5 MG/3ML) 0.083% IN NEBU
2.5000 mg | INHALATION_SOLUTION | Freq: Once | RESPIRATORY_TRACT | Status: AC
  Administered 2016-02-09: 2.5 mg via RESPIRATORY_TRACT

## 2016-02-15 ENCOUNTER — Encounter: Payer: Self-pay | Admitting: Pulmonary Disease

## 2016-02-15 ENCOUNTER — Telehealth: Payer: Self-pay | Admitting: Pulmonary Disease

## 2016-02-15 NOTE — Telephone Encounter (Signed)
Rodena Piety, can you check on this?  I think they are referring to a CMN Thanks.

## 2016-02-15 NOTE — Telephone Encounter (Signed)
PFT 02/09/16 >> FEV1 2.38 (70%), FEV1% 84, TLC 4.96 (70%), DLCO 78%, no BD  CXR 01/18/16 >> no ASD   Will have my nurse inform pt that that PFT showed mild changes of lung restriction most likely related to weight.  He should work on weight reduction.  No other change to current tx plan.

## 2016-02-19 ENCOUNTER — Telehealth: Payer: Self-pay | Admitting: Pulmonary Disease

## 2016-02-19 DIAGNOSIS — M25561 Pain in right knee: Secondary | ICD-10-CM | POA: Diagnosis not present

## 2016-02-19 NOTE — Telephone Encounter (Signed)
LM x 1 

## 2016-02-19 NOTE — Telephone Encounter (Signed)
Pt returned call, advised of PFT results / recs as stated by VS below Nothing further needed Will sign off

## 2016-02-19 NOTE — Telephone Encounter (Signed)
Per 6.19.17 [place] phone note, pt is aware of of PFT results / recs as stated by VS. Nothing further needed; will sign off.

## 2016-02-19 NOTE — Telephone Encounter (Signed)
Attempted to contact patient, left message for patient to return call.

## 2016-02-23 ENCOUNTER — Encounter: Payer: Self-pay | Admitting: Pulmonary Disease

## 2016-03-03 DIAGNOSIS — M659 Synovitis and tenosynovitis, unspecified: Secondary | ICD-10-CM | POA: Diagnosis not present

## 2016-03-03 DIAGNOSIS — M94261 Chondromalacia, right knee: Secondary | ICD-10-CM | POA: Diagnosis not present

## 2016-03-03 DIAGNOSIS — M23351 Other meniscus derangements, posterior horn of lateral meniscus, right knee: Secondary | ICD-10-CM | POA: Diagnosis not present

## 2016-03-03 DIAGNOSIS — M23251 Derangement of posterior horn of lateral meniscus due to old tear or injury, right knee: Secondary | ICD-10-CM | POA: Diagnosis not present

## 2016-03-03 DIAGNOSIS — M23321 Other meniscus derangements, posterior horn of medial meniscus, right knee: Secondary | ICD-10-CM | POA: Diagnosis not present

## 2016-03-03 DIAGNOSIS — M23221 Derangement of posterior horn of medial meniscus due to old tear or injury, right knee: Secondary | ICD-10-CM | POA: Diagnosis not present

## 2016-03-03 DIAGNOSIS — M2241 Chondromalacia patellae, right knee: Secondary | ICD-10-CM | POA: Diagnosis not present

## 2016-03-03 DIAGNOSIS — M25561 Pain in right knee: Secondary | ICD-10-CM | POA: Diagnosis not present

## 2016-03-03 DIAGNOSIS — G8918 Other acute postprocedural pain: Secondary | ICD-10-CM | POA: Diagnosis not present

## 2016-03-03 HISTORY — PX: KNEE ARTHROSCOPY: SUR90

## 2016-03-10 DIAGNOSIS — Z4789 Encounter for other orthopedic aftercare: Secondary | ICD-10-CM | POA: Diagnosis not present

## 2016-03-30 DIAGNOSIS — Z4789 Encounter for other orthopedic aftercare: Secondary | ICD-10-CM | POA: Diagnosis not present

## 2016-04-19 ENCOUNTER — Encounter: Payer: Self-pay | Admitting: Family Medicine

## 2016-04-21 ENCOUNTER — Ambulatory Visit (INDEPENDENT_AMBULATORY_CARE_PROVIDER_SITE_OTHER): Payer: Medicare Other | Admitting: Family Medicine

## 2016-04-21 ENCOUNTER — Encounter: Payer: Self-pay | Admitting: Family Medicine

## 2016-04-21 VITALS — BP 96/56 | HR 56 | Temp 97.3°F | Ht 70.0 in | Wt 264.0 lb

## 2016-04-21 DIAGNOSIS — R600 Localized edema: Secondary | ICD-10-CM | POA: Diagnosis not present

## 2016-04-21 DIAGNOSIS — I519 Heart disease, unspecified: Secondary | ICD-10-CM

## 2016-04-21 DIAGNOSIS — I5189 Other ill-defined heart diseases: Secondary | ICD-10-CM

## 2016-04-21 NOTE — Patient Instructions (Signed)
Great to meet you!   We will send your labs on mychart or call within 1 week  We will get an echo set up  Try the compression stockings, do not increase your lasix.

## 2016-04-21 NOTE — Progress Notes (Addendum)
   HPI  Patient presents today bilateral foot swelling for the last 2 weeks.  Patient reports 2 weeks of bilateral foot swelling, and gets worse throughout the day and improves by morning. His wife is a Marine scientist and is worried about him so he came in for evaluation.  He has chest pain, dyspnea, palpitations, or orthopnea. He does have obstructive sleep apnea and has very good compliance with his CPAP.  He denies any new exertional dyspnea Has not increased his salt intake.  PMH: Smoking status noted ROS: Per HPI  Objective: BP (!) 96/56 (BP Location: Right Arm, Patient Position: Sitting, Cuff Size: Large)   Pulse (!) 56   Temp 97.3 F (36.3 C) (Oral)   Ht _0  (1.778 m)   Wt 264 lb (119.7 kg)   BMI 37.88 kg/m  Gen: NAD, alert, cooperative with exam HEENT: NCAT CV: RRR, good S1/S2, no murmur Resp: CTABL, no wheezes, non-labored Ext: 1-2+ pitting edema bilateral lower extremities to the knee bilaterally, symmetric Neuro: Alert and oriented, No gross deficits  EKG with stable right bundle branch block, some QRS widening and V3 that was not as pronounced previously appears to be delta wave (present only in V2 previously)   Assessment and plan:  # Bilateral lower extremity edema I'll, asymptomatic With history of right 1 diastolic dysfunction, and new relative hypotension I have ordered an echo to ensure no progression of diastolic and/or systolic dysfunction. Continue current dose of Lasix, if we increase this he will likely have hypotension. I given him a prescription for compression stockings Labs including BNP.     Orders Placed This Encounter  Procedures  . Brain natriuretic peptide  . CMP14+EGFR  . CBC with Differential  . EKG 12-Lead  . ECHOCARDIOGRAM COMPLETE    BL leg swelling, hypotension, OSA, and Hx of  Diastolic dysfunction, monitor for worsening EF or diastolic function    Standing Status:   Future    Standing Expiration Date:   07/22/2017    Order  Specific Question:   Where should this test be performed    Answer:   [facility]    Order Specific Question:   Complete or Limited study?    Answer:   Complete    Order Specific Question:   Does the patient have a known history of hypersensitivity to Perflutren (aka Scientist, research (medical) for echocardiograms - CHECK ALLERGIES)    Answer:   No    Order Specific Question:   ADMINISTER PERFLUTERN    Answer:   ADMINISTER PERFLUTREN    Order Specific Question:   Expected Date:    Answer:   1 week    Order Specific Question:   Reason for exam-Echo    Answer:   Other - See [place], MD [facility] 04/21/2016, 11:49 AM

## 2016-04-22 LAB — CBC WITH DIFFERENTIAL/PLATELET
BASOS ABS: 0 10*3/uL (ref 0.0–0.2)
Basos: 1 %
EOS (ABSOLUTE): 0.3 10*3/uL (ref 0.0–0.4)
Eos: 6 %
Hematocrit: 46.4 % (ref 37.5–51.0)
Hemoglobin: 15.8 g/dL (ref 12.6–17.7)
Immature Grans (Abs): 0 10*3/uL (ref 0.0–0.1)
Immature Granulocytes: 0 %
LYMPHS ABS: 1.8 10*3/uL (ref 0.7–3.1)
Lymphs: 29 %
MCH: 35.5 pg — ABNORMAL HIGH (ref 26.6–33.0)
MCHC: 34.1 g/dL (ref 31.5–35.7)
MCV: 104 fL — ABNORMAL HIGH (ref 79–97)
MONOS ABS: 1 10*3/uL — AB (ref 0.1–0.9)
Monocytes: 15 %
NEUTROS PCT: 49 %
Neutrophils Absolute: 3 10*3/uL (ref 1.4–7.0)
PLATELETS: 172 10*3/uL (ref 150–379)
RBC: 4.45 x10E6/uL (ref 4.14–5.80)
RDW: 13.4 % (ref 12.3–15.4)
WBC: 6.2 10*3/uL (ref 3.4–10.8)

## 2016-04-22 LAB — CMP14+EGFR
ALBUMIN: 4.2 g/dL (ref 3.6–4.8)
ALK PHOS: 75 IU/L (ref 39–117)
ALT: 32 IU/L (ref 0–44)
AST: 35 IU/L (ref 0–40)
Albumin/Globulin Ratio: 1.6 (ref 1.2–2.2)
BILIRUBIN TOTAL: 0.6 mg/dL (ref 0.0–1.2)
BUN / CREAT RATIO: 15 (ref 10–24)
BUN: 14 mg/dL (ref 8–27)
CHLORIDE: 102 mmol/L (ref 96–106)
CO2: 28 mmol/L (ref 18–29)
CREATININE: 0.95 mg/dL (ref 0.76–1.27)
Calcium: 9.5 mg/dL (ref 8.6–10.2)
GFR calc Af Amer: 96 mL/min/{1.73_m2} (ref >59)
GFR calc non Af Amer: 83 mL/min/{1.73_m2} (ref >59)
GLOBULIN, TOTAL: 2.6 g/dL (ref 1.5–4.5)
GLUCOSE: 44 mg/dL — AB (ref 65–99)
Potassium: 4.6 mmol/L (ref 3.5–5.2)
SODIUM: 144 mmol/L (ref 134–144)
Total Protein: 6.8 g/dL (ref 6.0–8.5)

## 2016-04-22 LAB — BRAIN NATRIURETIC PEPTIDE: BNP: 10.6 pg/mL (ref 0.0–100.0)

## 2016-04-30 ENCOUNTER — Other Ambulatory Visit: Payer: Self-pay | Admitting: *Deleted

## 2016-04-30 ENCOUNTER — Encounter: Payer: Self-pay | Admitting: Family Medicine

## 2016-04-30 MED ORDER — FUROSEMIDE 20 MG PO TABS: 30.0000 mg | ORAL_TABLET | Freq: Every day | ORAL | 1 refills | Status: DC

## 2016-05-03 ENCOUNTER — Encounter: Payer: Self-pay | Admitting: Nurse Practitioner

## 2016-05-12 ENCOUNTER — Ambulatory Visit (INDEPENDENT_AMBULATORY_CARE_PROVIDER_SITE_OTHER): Payer: Medicare Other

## 2016-05-12 ENCOUNTER — Other Ambulatory Visit: Payer: Self-pay

## 2016-05-12 DIAGNOSIS — R600 Localized edema: Secondary | ICD-10-CM | POA: Diagnosis not present

## 2016-05-12 DIAGNOSIS — I519 Heart disease, unspecified: Secondary | ICD-10-CM | POA: Diagnosis not present

## 2016-05-12 DIAGNOSIS — I5189 Other ill-defined heart diseases: Secondary | ICD-10-CM

## 2016-05-12 LAB — ECHOCARDIOGRAM COMPLETE
AO mean calculated velocity dopler: 140 cm/s
AOPV: 0.77 m/s
AV Area VTI index: 0.97 cm2/m2
AV Mean grad: 9 mmHg
AV VEL mean LVOT/AV: 0.69
AV peak Index: 1.03
AV pk vel: 214 cm/s
AVAREAMEANV: 2.18 cm2
AVAREAMEANVIN: 0.93 cm2/m2
AVAREAVTI: 2.42 cm2
AVPG: 18 mmHg
AVPHT: 1338 ms
CHL CUP AV VEL: 2.29
CHL CUP STROKE VOLUME: 55 mL
CHL CUP TV REG PEAK VELOCITY: 126 cm/s
E/e' ratio: 11.59
EWDT: 243 ms
FS: 33 % (ref 28–44)
IV/PV OW: 1.05
LA ID, A-P, ES: 48 mm
LA diam end sys: 48 mm
LA diam index: 2.04 cm/m2
LA vol A4C: 65.1 ml
LA vol: 82.4 mL
LAVOLIN: 35.1 mL/m2
LDCA: 3.14 cm2
LV E/e'average: 11.59
LV SIMPSON'S DISK: 61
LV TDI E'LATERAL: 8.8
LV TDI E'MEDIAL: 9.38
LV e' LATERAL: 8.8 cm/s
LVDIAVOL: 89 mL (ref 62–150)
LVDIAVOLIN: 38 mL/m2
LVEEMED: 11.59
LVOT VTI: 30.3 cm
LVOT peak grad rest: 11 mmHg
LVOT peak vel: 165 cm/s
LVOTD: 20 mm
LVOTSV: 95 mL
LVOTVTI: 0.73 cm
LVSYSVOL: 35 mL (ref 21–61)
LVSYSVOLIN: 15 mL/m2
MV Dec: 243
MV pk A vel: 84.4 m/s
MV pk E vel: 102 m/s
MVPG: 4 mmHg
PW: 8.18 mm — AB (ref 0.6–1.1)
RV TAPSE: 22.9 mm
RV sys press: 9 mmHg
TR max vel: 126 cm/s
VTI: 41.5 cm
Valve area index: 0.97
Valve area: 2.29 cm2

## 2016-05-18 ENCOUNTER — Ambulatory Visit (INDEPENDENT_AMBULATORY_CARE_PROVIDER_SITE_OTHER): Payer: Medicare Other | Admitting: Nurse Practitioner

## 2016-05-18 ENCOUNTER — Encounter: Payer: Self-pay | Admitting: Nurse Practitioner

## 2016-05-18 VITALS — BP 110/60 | HR 60 | Ht 70.0 in | Wt 267.4 lb

## 2016-05-18 DIAGNOSIS — I739 Peripheral vascular disease, unspecified: Principal | ICD-10-CM

## 2016-05-18 DIAGNOSIS — I779 Disorder of arteries and arterioles, unspecified: Secondary | ICD-10-CM | POA: Diagnosis not present

## 2016-05-18 NOTE — Progress Notes (Signed)
CARDIOLOGY OFFICE NOTE  Date:  05/18/2016    [name] Date of Birth: 12-07-1950 Medical Record #[id number]  PCP:  Redge Gainer, MD  Cardiologist:  Annabell Howells    Chief Complaint  Patient presents with  . Hypertension    1 year check - seen for Dr. Aundra Dubin    History of Present Illness: Tony Cantu is a 65 y.o. male who presents today for a one year check. Seen for Dr. Aundra Dubin. He has a HTN, IDDM, & obesity.  He had a cardiolite in 2009 that was a normal study. He uses CPAP for his sleep apnea. He has mild carotid disease - last checked 9/15 with 1 to 39% bilateral stenosis. Labs are checked by PCP.  Last seen a year ago and was felt to be doing ok from cardiac standpoint. He was separated from his 2nd wife and back with his first. He had lost weight.   Comes back today. Here alone.  Doing ok except for knee pain. Got back with wife #2 - apparently wife #1 had a drug issue. He says he is happy now. Has had knee surgery this summer. Had some swelling in this leg - got an echo due to the swelling - see below. Pumping function is ok. Probably has some diastolic dysfunction. Mild aortic regurg noted. No chest pain. Not really exercising. BP ok. Asking about his carotid study.   PMH: 1. Obesity 2. HTN 3. Diabetes mellitus with history of hypoglycemic episodes.  4. Hyperlipidemia 5. Cardiolite (6/09): Normal study.  6. Lumbar spine surgery in 1994 7. Hernia repair in 2003 8. Plantar fasciitis 9. Shoulder replacement 10. OSA on CPAP 11. Echo (7/13): EF 55-60%, mild LVH, mild RV dilation, mildly decreased RV systolic function.   Past Medical History:  Diagnosis Date  . Arthritis   . Asthma   . BPH (benign prostatic hypertrophy)   . Colon polyps   . Depression    no meds  . Gastritis   . GERD (gastroesophageal reflux disease)   . Hypertension   . IDDM (insulin dependent diabetes mellitus) ([place])   . Sleep apnea    uses CPAP nightly  . Testicle trouble      both     Past Surgical History:  Procedure Laterality Date  . BACK SURGERY    . CARPAL TUNNEL RELEASE Right 10/13/2015   Procedure: RIGHT CARPAL TUNNEL RELEASE;  Surgeon: Daryll Brod, MD;  Location: [facility];  Service: Orthopedics;  Laterality: Right;  . HERNIA REPAIR    . INGUINAL HERNIA REPAIR  2003   right   . KNEE ARTHROSCOPY Right 03/03/2016  . LUMBAR DISC SURGERY  8/96   Dr. Coralyn Mark, discectomy  . SHOULDER SURGERY  11/28/05   left partial  . SHOULDER SURGERY  07/14/2006   RIGHT  . TONSILECTOMY, ADENOIDECTOMY, BILATERAL MYRINGOTOMY AND TUBES       Medications: Current Outpatient Prescriptions  Medication Sig Dispense Refill  . aspirin 325 MG EC tablet Take 325 mg by mouth daily.    Marland Kitchen atorvastatin (LIPITOR) 40 MG tablet Take 1 tablet (40 mg total) by mouth daily. 90 tablet 3  . b complex vitamins tablet Take 1 tablet by mouth daily.      . B-D ULTRA-FINE 33 LANCETS MISC Check Blood sugar 8 times daily and PRN.DX E11.9 800 each 3  . Calcium Carbonate (CALTRATE 600 PO) Take by mouth daily.      . Cholecalciferol (VITAMIN D3) 2000  UNITS capsule Take 2,000 Units by mouth daily.      Marland Kitchen EPIPEN 2-PAK 0.3 MG/0.3ML SOAJ injection     . fexofenadine (ALLEGRA) 180 MG tablet Take 180 mg by mouth daily.    . Flaxseed, Linseed, (FLAX SEED OIL PO) Take 1 capsule by mouth daily.    . furosemide (LASIX) 20 MG tablet Take 1.5 tablets (30 mg total) by mouth daily. 135 tablet 1  . Glucosamine-Chondroit-Vit C-Mn (GLUCOSAMINE 1500 COMPLEX) CAPS Take by mouth daily.      Marland Kitchen glucose blood (ONE TOUCH ULTRA TEST) test strip chesk BS q 2 hours PRN.dx.E11.9 800 each 3  . insulin glargine (LANTUS) 100 UNIT/ML injection Inject 0.75 mLs (75 Units total) into the skin 2 (two) times daily. As directed 12 vial 3  . insulin lispro (HUMALOG) 100 UNIT/ML injection Inject 0.2-0.5 mLs (20-50 Units total) into the skin 3 (three) times daily before meals. Sliding scale 12 vial 3  . Insulin  Syringe-Needle U-100 (BD INSULIN SYRINGE ULTRAFINE) 31G X 15/64" 1 ML MISC Inject 1 Syringe into the skin 2 (two) times daily. Dx.E11.9 180 each 3  . Insulin Syringe-Needle U-100 (BD INSULIN SYRINGE ULTRAFINE) 31G X 5/16" 0.5 ML MISC USE TO INJECT 8 TO 10 TIMES DAILY, dx E11.9 800 each 3  . montelukast (SINGULAIR) 10 MG tablet Take 1 tablet (10 mg total) by mouth at bedtime. 90 tablet 3  . MULTIPLE VITAMIN PO Take by mouth daily.    . pantoprazole (PROTONIX) 40 MG tablet Take 1 tablet (40 mg total) by mouth daily. 90 tablet 3  . ranitidine (ZANTAC) 75 MG tablet Take 75 mg by mouth at bedtime.    . Testosterone (AXIRON) 30 MG/ACT SOLN Place 1 application onto the skin daily. Place 1 application under each axilla daily 270 mL 3  . traMADol (ULTRAM) 50 MG tablet Take 1 tablet (50 mg total) by mouth every 6 (six) hours as needed. 30 tablet 0  . valsartan (DIOVAN) 160 MG tablet TAKE 1 TABLET DAILY 90 tablet 0   No current facility-administered medications for this visit.     Allergies: Allergies  Allergen Reactions  . Erythromycin   . Sulfonamide Derivatives     Social History: The patient  reports that he quit smoking about 14 years ago. His smoking use included Cigarettes and Pipe. He quit after 34.00 years of use. He has never used smokeless tobacco. He reports that he drinks alcohol. He reports that he does not use drugs.   Family History: The patient's family history includes Allergic rhinitis in his sister; Asthma in his father; Breast cancer in his maternal grandmother; Heart attack in his maternal grandmother and mother; Heart failure in his maternal grandmother and mother; Rheum arthritis in his maternal grandmother; Suicidality in his father.   Review of Systems: Please see the history of present illness.   Otherwise, the review of systems is positive for none.   All other systems are reviewed and negative.   Physical Exam: VS:  BP 110/60   Pulse 60   Ht 5' 10"  (1.778 m)   Wt  267 lb 6.4 oz (121.3 kg)   BMI 38.37 kg/m  .  BMI Body mass index is 38.37 kg/m.  Wt Readings from Last 3 Encounters:  05/18/16 267 lb 6.4 oz (121.3 kg)  04/21/16 264 lb (119.7 kg)  01/22/16 258 lb 3.2 oz (117.1 kg)    General: Pleasant. He is obese and of large stature. He is alert and in no acute  distress.   HEENT: Normal.  Neck: Supple, no JVD, carotid bruits, or masses noted.  Cardiac: Regular rate and rhythm. Systolic murmur. No edema.  Respiratory:  Lungs are clear to auscultation bilaterally with normal work of breathing.  GI: Soft and nontender.  MS: No deformity or atrophy. Gait and ROM intact.  Skin: Warm and dry. Color is normal.  Neuro:  Strength and sensation are intact and no gross focal deficits noted.  Psych: Alert, appropriate and with normal affect.   LABORATORY DATA:  EKG:  EKG is not ordered today.   Lab Results  Component Value Date   WBC 6.2 04/21/2016   HGB 17.3 02/24/2015   HCT 46.4 04/21/2016   PLT 172 04/21/2016   GLUCOSE 44 (L) 04/21/2016   CHOL 126 01/05/2016   TRIG 86 01/05/2016   HDL 57 01/05/2016   LDLCALC 44 01/03/2014   ALT 32 04/21/2016   AST 35 04/21/2016   NA 144 04/21/2016   K 4.6 04/21/2016   CL 102 04/21/2016   CREATININE 0.95 04/21/2016   BUN 14 04/21/2016   CO2 28 04/21/2016   PSA 1.6 07/21/2014   HGBA1C 5.2 01/05/2016   MICROALBUR 20 01/19/2015    BNP (last 3 results)  Recent Labs  04/21/16 1148  BNP 10.6    ProBNP (last 3 results) No results for input(s): PROBNP in the last 8760 hours.   Other Studies Reviewed Today:  Echo Study Conclusions from 04/2016  - Left ventricle: The cavity size was moderately dilated. [place]   thickness was normal. Systolic function was normal. The estimated   ejection fraction was in the range of 60% to 65%. Images were   inadequate for LV Chillemi motion assessment. Diastolic dysfunction,   indeterminate grade. Indeterminate filling pressures. - Aortic valve: Moderately calcified  leaflets. There was no   stenosis. There was mild regurgitation. Peak velocity (S): 214   cm/s. Mean gradient (S): 9 mm Hg. Valve area (VTI): 2.29 cm^2.   Valve area (Vmax): 2.42 cm^2. - Left atrium: The atrium was mildly to moderately dilated. - Pulmonic valve: There was mild regurgitation.   Assessment/Plan:  1. HTN - BP looks great on current regimen.  2. HLD - on therapy. Labs noted - will stop Niaspan  3. OSA - on CPAP  4. IDDM  5. Obesity - needs to get back on track with diet/exercise/weight loss - discussed at length.   6. Carotid bruits - needs study arranged   Current medicines are reviewed with the patient today.  The patient does not have concerns regarding medicines other than what has been noted above.  The following changes have been made:  See above.  Labs/ tests ordered today include:   No orders of the defined types were placed in this encounter.    Disposition:   FU with me in one year.    Patient is agreeable to this plan and will call if any problems develop in the interim.   Signed: Burtis Junes, RN, ANP-C 05/18/2016 9:25 AM  [facility] [address] [place],   [address] Phone: [phone] Fax: [phone]

## 2016-05-18 NOTE — Patient Instructions (Addendum)
We will be checking the following labs today - NONE   Medication Instructions:    Continue with your current medicines. But  I am stopping the Niaspan    Testing/Procedures To Be Arranged:  Carotid doppler study  Follow-Up:   See me in one year    Other Special Instructions:   Think about what we talked about today    If you need a refill on your cardiac medications before your next appointment, please call your pharmacy.   Call the [place] office at [phone] if you have any questions, problems or concerns.

## 2016-05-19 DIAGNOSIS — B070 Plantar wart: Secondary | ICD-10-CM | POA: Diagnosis not present

## 2016-05-19 DIAGNOSIS — M79672 Pain in left foot: Secondary | ICD-10-CM | POA: Diagnosis not present

## 2016-05-23 ENCOUNTER — Other Ambulatory Visit: Payer: Self-pay | Admitting: *Deleted

## 2016-05-23 ENCOUNTER — Encounter: Payer: Self-pay | Admitting: Family Medicine

## 2016-05-23 DIAGNOSIS — E669 Obesity, unspecified: Secondary | ICD-10-CM

## 2016-05-23 DIAGNOSIS — Z9189 Other specified personal risk factors, not elsewhere classified: Secondary | ICD-10-CM

## 2016-05-23 DIAGNOSIS — E785 Hyperlipidemia, unspecified: Secondary | ICD-10-CM

## 2016-05-23 DIAGNOSIS — I10 Essential (primary) hypertension: Secondary | ICD-10-CM

## 2016-05-25 ENCOUNTER — Telehealth: Payer: Self-pay | Admitting: *Deleted

## 2016-05-25 DIAGNOSIS — G5602 Carpal tunnel syndrome, left upper limb: Secondary | ICD-10-CM | POA: Diagnosis not present

## 2016-05-25 MED ORDER — AMOXICILLIN 500 MG PO CAPS: 500.0000 mg | ORAL_CAPSULE | Freq: Three times a day (TID) | ORAL | 0 refills | Status: DC

## 2016-05-25 NOTE — Telephone Encounter (Signed)
Pt calls and states he has a bad sinus infection and would like something called in -- per [facility] Amox 500 tid to [employer] - done

## 2016-05-26 ENCOUNTER — Other Ambulatory Visit: Payer: Self-pay | Admitting: Orthopedic Surgery

## 2016-05-26 ENCOUNTER — Encounter (HOSPITAL_COMMUNITY): Payer: Medicare Other

## 2016-05-31 ENCOUNTER — Ambulatory Visit (HOSPITAL_COMMUNITY)
Admission: RE | Admit: 2016-05-31 | Discharge: 2016-05-31 | Disposition: A | Discharge status: Home / Self Care | Payer: Medicare Other | Source: Ambulatory Visit | Attending: Cardiovascular Disease | Admitting: Cardiovascular Disease

## 2016-05-31 DIAGNOSIS — I6523 Occlusion and stenosis of bilateral carotid arteries: Secondary | ICD-10-CM | POA: Diagnosis not present

## 2016-05-31 DIAGNOSIS — I779 Disorder of arteries and arterioles, unspecified: Secondary | ICD-10-CM

## 2016-05-31 DIAGNOSIS — I739 Peripheral vascular disease, unspecified: Secondary | ICD-10-CM

## 2016-06-07 ENCOUNTER — Other Ambulatory Visit (INDEPENDENT_AMBULATORY_CARE_PROVIDER_SITE_OTHER): Payer: Medicare Other

## 2016-06-07 DIAGNOSIS — I10 Essential (primary) hypertension: Secondary | ICD-10-CM

## 2016-06-07 DIAGNOSIS — E669 Obesity, unspecified: Secondary | ICD-10-CM | POA: Diagnosis not present

## 2016-06-07 DIAGNOSIS — Z23 Encounter for immunization: Secondary | ICD-10-CM

## 2016-06-07 DIAGNOSIS — N182 Chronic kidney disease, stage 2 (mild): Secondary | ICD-10-CM | POA: Diagnosis not present

## 2016-06-07 DIAGNOSIS — Z9189 Other specified personal risk factors, not elsewhere classified: Secondary | ICD-10-CM

## 2016-06-07 DIAGNOSIS — E785 Hyperlipidemia, unspecified: Secondary | ICD-10-CM

## 2016-06-07 DIAGNOSIS — E1122 Type 2 diabetes mellitus with diabetic chronic kidney disease: Secondary | ICD-10-CM | POA: Diagnosis not present

## 2016-06-07 DIAGNOSIS — E559 Vitamin D deficiency, unspecified: Secondary | ICD-10-CM | POA: Diagnosis not present

## 2016-06-07 DIAGNOSIS — Z794 Long term (current) use of insulin: Secondary | ICD-10-CM | POA: Diagnosis not present

## 2016-06-07 LAB — BAYER DCA HB A1C WAIVED: HB A1C (BAYER DCA - WAIVED): 4.7 % (ref <7.0)

## 2016-06-08 ENCOUNTER — Other Ambulatory Visit (INDEPENDENT_AMBULATORY_CARE_PROVIDER_SITE_OTHER): Payer: Medicare Other

## 2016-06-08 DIAGNOSIS — Z1211 Encounter for screening for malignant neoplasm of colon: Secondary | ICD-10-CM

## 2016-06-08 LAB — BMP8+EGFR
BUN / CREAT RATIO: 15 (ref 10–24)
BUN: 14 mg/dL (ref 8–27)
CO2: 23 mmol/L (ref 18–29)
CREATININE: 0.91 mg/dL (ref 0.76–1.27)
Calcium: 9.8 mg/dL (ref 8.6–10.2)
Chloride: 101 mmol/L (ref 96–106)
GFR calc non Af Amer: 88 mL/min/{1.73_m2} (ref >59)
GFR, EST AFRICAN AMERICAN: 101 mL/min/{1.73_m2} (ref >59)
GLUCOSE: 75 mg/dL (ref 65–99)
Potassium: 5 mmol/L (ref 3.5–5.2)
SODIUM: 144 mmol/L (ref 134–144)

## 2016-06-08 LAB — CBC WITH DIFFERENTIAL/PLATELET
BASOS: 1 %
Basophils Absolute: 0 10*3/uL (ref 0.0–0.2)
EOS (ABSOLUTE): 0.2 10*3/uL (ref 0.0–0.4)
EOS: 3 %
HEMATOCRIT: 46.9 % (ref 37.5–51.0)
Hemoglobin: 16.1 g/dL (ref 12.6–17.7)
Immature Grans (Abs): 0 10*3/uL (ref 0.0–0.1)
Immature Granulocytes: 1 %
LYMPHS ABS: 1.6 10*3/uL (ref 0.7–3.1)
Lymphs: 25 %
MCH: 34.5 pg — AB (ref 26.6–33.0)
MCHC: 34.3 g/dL (ref 31.5–35.7)
MCV: 101 fL — AB (ref 79–97)
MONOS ABS: 1 10*3/uL — AB (ref 0.1–0.9)
Monocytes: 16 %
NEUTROS ABS: 3.6 10*3/uL (ref 1.4–7.0)
Neutrophils: 54 %
Platelets: 173 10*3/uL (ref 150–379)
RBC: 4.66 x10E6/uL (ref 4.14–5.80)
RDW: 13.6 % (ref 12.3–15.4)
WBC: 6.5 10*3/uL (ref 3.4–10.8)

## 2016-06-08 LAB — HEPATIC FUNCTION PANEL
ALK PHOS: 81 IU/L (ref 39–117)
ALT: 32 IU/L (ref 0–44)
AST: 36 IU/L (ref 0–40)
Albumin: 4.1 g/dL (ref 3.6–4.8)
BILIRUBIN, DIRECT: 0.16 mg/dL (ref 0.00–0.40)
Bilirubin Total: 0.5 mg/dL (ref 0.0–1.2)
Total Protein: 6.9 g/dL (ref 6.0–8.5)

## 2016-06-08 LAB — NMR, LIPOPROFILE
CHOLESTEROL: 109 mg/dL (ref 100–199)
HDL Cholesterol by NMR: 49 mg/dL (ref >39)
HDL Particle Number: 33.4 umol/L (ref >=30.5)
LDL PARTICLE NUMBER: 531 nmol/L (ref <1000)
LDL SIZE: 21 nm (ref >20.5)
LDL-C: 48 mg/dL (ref 0–99)
LP-IR SCORE: 41 (ref <=45)
Small LDL Particle Number: 216 nmol/L (ref <=527)
TRIGLYCERIDES BY NMR: 62 mg/dL (ref 0–149)

## 2016-06-09 DIAGNOSIS — M79672 Pain in left foot: Secondary | ICD-10-CM | POA: Diagnosis not present

## 2016-06-09 DIAGNOSIS — B070 Plantar wart: Secondary | ICD-10-CM | POA: Diagnosis not present

## 2016-06-09 LAB — FECAL OCCULT BLOOD, IMMUNOCHEMICAL: Fecal Occult Bld: NEGATIVE

## 2016-06-16 ENCOUNTER — Encounter: Payer: Self-pay | Admitting: Family Medicine

## 2016-06-16 ENCOUNTER — Ambulatory Visit (INDEPENDENT_AMBULATORY_CARE_PROVIDER_SITE_OTHER): Payer: Medicare Other | Admitting: Family Medicine

## 2016-06-16 VITALS — BP 104/62 | HR 64 | Temp 97.3°F | Ht 70.0 in | Wt 268.0 lb

## 2016-06-16 DIAGNOSIS — D696 Thrombocytopenia, unspecified: Secondary | ICD-10-CM

## 2016-06-16 DIAGNOSIS — E559 Vitamin D deficiency, unspecified: Secondary | ICD-10-CM

## 2016-06-16 DIAGNOSIS — E7800 Pure hypercholesterolemia, unspecified: Secondary | ICD-10-CM

## 2016-06-16 DIAGNOSIS — N182 Chronic kidney disease, stage 2 (mild): Secondary | ICD-10-CM

## 2016-06-16 DIAGNOSIS — E349 Endocrine disorder, unspecified: Secondary | ICD-10-CM

## 2016-06-16 DIAGNOSIS — Z794 Long term (current) use of insulin: Secondary | ICD-10-CM

## 2016-06-16 DIAGNOSIS — R6889 Other general symptoms and signs: Secondary | ICD-10-CM | POA: Diagnosis not present

## 2016-06-16 DIAGNOSIS — E1122 Type 2 diabetes mellitus with diabetic chronic kidney disease: Secondary | ICD-10-CM | POA: Diagnosis not present

## 2016-06-16 DIAGNOSIS — M4726 Other spondylosis with radiculopathy, lumbar region: Secondary | ICD-10-CM

## 2016-06-16 DIAGNOSIS — I10 Essential (primary) hypertension: Secondary | ICD-10-CM

## 2016-06-16 DIAGNOSIS — Z0000 Encounter for general adult medical examination without abnormal findings: Secondary | ICD-10-CM

## 2016-06-16 DIAGNOSIS — M48061 Spinal stenosis, lumbar region without neurogenic claudication: Secondary | ICD-10-CM

## 2016-06-16 DIAGNOSIS — N400 Enlarged prostate without lower urinary tract symptoms: Secondary | ICD-10-CM

## 2016-06-16 DIAGNOSIS — F4323 Adjustment disorder with mixed anxiety and depressed mood: Secondary | ICD-10-CM | POA: Diagnosis not present

## 2016-06-16 LAB — URINALYSIS, COMPLETE
Bilirubin, UA: NEGATIVE
GLUCOSE, UA: NEGATIVE
KETONES UA: NEGATIVE
LEUKOCYTES UA: NEGATIVE
Nitrite, UA: NEGATIVE
Protein, UA: NEGATIVE
RBC, UA: NEGATIVE
SPEC GRAV UA: 1.015 (ref 1.005–1.030)
Urobilinogen, Ur: 0.2 mg/dL (ref 0.2–1.0)
pH, UA: 7.5 (ref 5.0–7.5)

## 2016-06-16 LAB — MICROSCOPIC EXAMINATION
RBC, UA: NONE SEEN /hpf (ref 0–?)
WBC, UA: NONE SEEN /hpf (ref 0–?)

## 2016-06-16 NOTE — Patient Instructions (Addendum)
Medicare Annual Wellness Visit  [facility] and the medical providers at [facility] strive to bring you the best medical care.  In doing so we not only want to address your current medical conditions and concerns but also to detect new conditions early and prevent illness, disease and health-related problems.    Medicare offers a yearly Wellness Visit which allows our clinical staff to assess your need for preventative services including immunizations, lifestyle education, counseling to decrease risk of preventable diseases and screening for fall risk and other medical concerns.    This visit is provided free of charge (no copay) for all Medicare recipients. The clinical pharmacists at [facility] have begun to conduct these Wellness Visits which will also include a thorough review of all your medications.    As you primary medical provider recommend that you make an appointment for your Annual Wellness Visit if you have not done so already this year.  You may set up this appointment before you leave today or you may call back ([phone]) and schedule an appointment.  Please make sure when you call that you mention that you are scheduling your Annual Wellness Visit with the clinical pharmacist so that the appointment may be made for the proper length of time.     Continue current medications. Continue good therapeutic lifestyle changes which include good diet and exercise. Fall precautions discussed with patient. If an FOBT was given today- please return it to our front desk. If you are over 34 years old - you may need Prevnar 21 or the adult Pneumonia vaccine.  **Flu shots are available--- please call and schedule a FLU-CLINIC appointment**  After your visit with [place] today you will receive a survey in the mail or online from [employer] regarding your care with [place]. Please take a moment to fill this out. Your feedback is very  important to [place] as you can help [place] better understand your patient needs as well as improve your experience and satisfaction. WE CARE ABOUT YOU!!!   B careful with the low blood sugars If you continue to have problems with right groin pain we may need to schedule you to see the neurosurgeon for further evaluation This winter drink plenty of fluids and stay well hydrated and usually a cool mist humidifier

## 2016-06-16 NOTE — Progress Notes (Signed)
Subjective:    Patient ID: Tony Cantu, male    DOB: 02-12-1951, 65 y.o.   MRN: [id number]  HPI Pt here for follow up and management of chronic medical problems which includes diabetes, hyperlipidemia and hypertension. He is taking medications regularly.The patient is doing well overall. He does complain of some right hip soreness. He also complains of some irritating skin tags. His blood work has been done and this will be reviewed with him during the visit today. His hemoglobin A1c was excellent at 4.7. The creatinine and electrolytes were good. The CBC was within normal limits. All liver function tests were normal. Also cholesterol numbers with advanced lipid testing were excellent. The right hip soreness is been going on for years. He does not recall any specific injury. He notices it especially with walking. He did have a CT scan of his abdomen back in the summer we will review that and make sure there is no comments about the hip. He also has a lot of skin tags which are very irritating to him under both arms. The patient denies any chest pain tightness or palpitations. He denies any more shortness of breath than usual. He does have occasional belching and burping and does take Zantac 75 mg. He does not have any heartburn nausea vomiting diarrhea or blood in the stool or black tarry bowel movements. His bowel habits have changed somewhat when he used to have stools once a day he now may have them every other day. He's passing his water without problems and has no history of any sexual dysfunction.    Patient Active Problem List   Diagnosis Date Noted  . Type 2 diabetes mellitus with hyperlipidemia ([place]) 01/18/2016  . Inguinal hernia 12/10/2015  . Right groin pain 11/30/2015  . Thrombocytopenia ([place]) 07/21/2014  . Bilateral carotid bruits 05/11/2014  . Vitamin D deficiency 09/17/2013  . BPH (benign prostatic hyperplasia) 05/22/2013  . 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 Diabetes  type 2, controlled ([place]) 01/10/2013  . Essential hypertension, benign 01/10/2013  . OSA (obstructive sleep apnea) 05/16/2012  . Edema 02/21/2012  . At risk for coronary artery disease 03/20/2011  . Obesity 03/20/2011  . WEIGHT GAIN, ABNORMAL 11/09/2010  . Hyperlipidemia 06/22/2010  . ALLERGIC RHINITIS 06/22/2010  . ASTHMA 06/22/2010  . COUGH 06/22/2010   Outpatient Encounter Prescriptions as of 06/16/2016  Medication Sig  . aspirin 325 MG EC tablet Take 325 mg by mouth daily.  Marland Kitchen atorvastatin (LIPITOR) 40 MG tablet Take 1 tablet (40 mg total) by mouth daily.  Marland Kitchen b complex vitamins tablet Take 1 tablet by mouth daily.    . B-D ULTRA-FINE 33 LANCETS MISC Check Blood sugar 8 times daily and PRN.DX E11.9  . Calcium Carbonate (CALTRATE 600 PO) Take by mouth daily.    . Cholecalciferol (VITAMIN D3) 2000 UNITS capsule Take 2,000 Units by mouth daily.    Marland Kitchen EPIPEN 2-PAK 0.3 MG/0.3ML SOAJ injection   . fexofenadine (ALLEGRA) 180 MG tablet Take 180 mg by mouth daily.  . Flaxseed, Linseed, (FLAX SEED OIL PO) Take 1 capsule by mouth daily.  . furosemide (LASIX) 20 MG tablet Take 1.5 tablets (30 mg total) by mouth daily.  . Glucosamine-Chondroit-Vit C-Mn (GLUCOSAMINE 1500 COMPLEX) CAPS Take by mouth daily.    Marland Kitchen glucose blood (ONE TOUCH ULTRA TEST) test strip chesk BS q 2 hours PRN.dx.E11.9  . insulin glargine (LANTUS) 100 UNIT/ML injection Inject 0.75 mLs (75 Units total) into the skin 2 (two) times daily.  As directed  . insulin lispro (HUMALOG) 100 UNIT/ML injection Inject 0.2-0.5 mLs (20-50 Units total) into the skin 3 (three) times daily before meals. Sliding scale  . Insulin Syringe-Needle U-100 (BD INSULIN SYRINGE ULTRAFINE) 31G X 15/64" 1 ML MISC Inject 1 Syringe into the skin 2 (two) times daily. Dx.E11.9  . Insulin Syringe-Needle U-100 (BD INSULIN SYRINGE ULTRAFINE) 31G X 5/16" 0.5 ML MISC USE TO INJECT 8 TO 10 TIMES DAILY, dx E11.9  . montelukast (SINGULAIR) 10 MG tablet Take 1 tablet (10 mg  total) by mouth at bedtime.  . MULTIPLE VITAMIN PO Take by mouth daily.  . pantoprazole (PROTONIX) 40 MG tablet Take 1 tablet (40 mg total) by mouth daily.  . ranitidine (ZANTAC) 75 MG tablet Take 75 mg by mouth at bedtime.  . Testosterone (AXIRON) 30 MG/ACT SOLN Place 1 application onto the skin daily. Place 1 application under each axilla daily  . traMADol (ULTRAM) 50 MG tablet Take 1 tablet (50 mg total) by mouth every 6 (six) hours as needed.  . valsartan (DIOVAN) 160 MG tablet TAKE 1 TABLET DAILY  . [DISCONTINUED] amoxicillin (AMOXIL) 500 MG capsule Take 1 capsule (500 mg total) by mouth 3 (three) times daily.   No facility-administered encounter medications on file as of 06/16/2016.       Review of Systems  Constitutional: Negative.   HENT: Negative.   Eyes: Negative.   Respiratory: Negative.   Cardiovascular: Negative.   Gastrointestinal: Negative.   Endocrine: Negative.   Genitourinary: Negative.   Musculoskeletal: Positive for arthralgias (right hip soreness at times).  Skin: Negative.        Skin tags  Allergic/Immunologic: Negative.   Neurological: Negative.   Hematological: Negative.   Psychiatric/Behavioral: Negative.        Objective:   Physical Exam  Constitutional: He is oriented to person, place, and time. He appears well-developed and well-nourished. No distress.  HENT:  Head: Normocephalic and atraumatic.  Right Ear: External ear normal.  Left Ear: External ear normal.  Nose: Nose normal.  Mouth/Throat: Oropharynx is clear and moist. No oropharyngeal exudate.  Eyes: Conjunctivae and EOM are normal. Pupils are equal, round, and reactive to light. Right eye exhibits no discharge. Left eye exhibits no discharge. No scleral icterus.  Neck: Normal range of motion. Neck supple. No thyromegaly present.  No bruits thyromegaly or anterior cervical adenopathy  Cardiovascular: Normal rate, regular rhythm, normal heart sounds and intact distal pulses.   No murmur  heard. The heart has a regular rate and rhythm at 72/m  Pulmonary/Chest: Effort normal and breath sounds normal. No respiratory distress. He has no wheezes. He has no rales. He exhibits no tenderness.  Clear anteriorly and posteriorly no axillary adenopathy  Abdominal: Soft. Bowel sounds are normal. He exhibits no mass. There is no tenderness. There is no rebound and no guarding.  Abdominal obesity without bruits. No organ enlargement and no liver or spleen enlargement.  Genitourinary: Rectum normal and penis normal.  Genitourinary Comments: The prostate is enlarged but soft and smooth. There are no rectal masses. There is no inguinal hernia. 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 Genitalia were otherwise within normal limits.  Musculoskeletal: He exhibits no edema.  Limited range of motion with abduction of both shoulders due to previous surgery.  Lymphadenopathy:    He has no cervical adenopathy.  Neurological: He is alert and oriented to person, place, and time. He has normal reflexes. No cranial nerve deficit.  Skin: Skin is warm and dry. No rash noted.  Dry skin in general. Multiple irritated skin tags beneath both axillae  Psychiatric: He has a normal mood and affect. His behavior is normal. Judgment and thought content normal.  Nursing note and vitals reviewed.  BP 104/62 (BP Location: Left Arm)   Pulse 64   Temp 97.3 F (36.3 C) (Oral)   Ht 5' 10"  (1.778 m)   Wt 268 lb (121.6 kg)   BMI 38.45 kg/m         Assessment & Plan:  1. Vitamin D deficiency -Continue with current treatment  2. Essential hypertension, benign -Blood pressure is good today and continue with current treatment  3. Pure hypercholesterolemia -All cholesterol numbers are excellent with advanced lipid testing and patient will continue with current treatment  4. Benign prostatic hyperplasia, unspecified whether lower urinary tract symptoms present -No complaints with voiding or symptoms with BPH  5. Testosterone  deficiency -Continue with Axiron pending results of testosterone levels and PSA - Urinalysis, Complete - PSA, total and free - Testosterone,Free and Total  6. 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 Patient was cautioned about 2 tight of control and he is aware of this.  7. Thrombocytopenia (HCC) -No history of any bleeding issues  8. Situational mixed anxiety and depressive disorder -This seems to be resolved as he and his wife are back together and apparently have a good relationship.  9. Healthcare maintenance - Thyroid Panel With TSH  10. Osteoarthritis of spine with radiculopathy, lumbar region -If right hip issues continue may need to see neurosurgeon  50. Spinal stenosis of lumbar region, unspecified whether neurogenic claudication present -Consider re-referring to Dr. Sherwood Gambler  Patient Instructions                       Medicare Annual Wellness Visit  [facility] and the medical providers at [facility] strive to bring you the best medical care.  In doing so we not only want to address your current medical conditions and concerns but also to detect new conditions early and prevent illness, disease and health-related problems.    Medicare offers a yearly Wellness Visit which allows our clinical staff to assess your need for preventative services including immunizations, lifestyle education, counseling to decrease risk of preventable diseases and screening for fall risk and other medical concerns.    This visit is provided free of charge (no copay) for all Medicare recipients. The clinical pharmacists at [facility] have begun to conduct these Wellness Visits which will also include a thorough review of all your medications.    As you primary medical provider recommend that you make an appointment for your Annual Wellness Visit if you  have not done so already this year.  You may set up this appointment before you leave today or you may call back ([phone]) and schedule an appointment.  Please make sure when you call that you mention that you are scheduling your Annual Wellness Visit with the clinical pharmacist so that the appointment may be made for the proper length of time.     Continue current medications. Continue good therapeutic lifestyle changes which include good diet and exercise. Fall precautions discussed with patient. If an FOBT was given today- please return it to our front desk. If you are over 85 years old - you may need Prevnar 32 or the adult Pneumonia vaccine.  **Flu shots are available--- please  call and schedule a FLU-CLINIC appointment**  After your visit with [place] today you will receive a survey in the mail or online from [employer] regarding your care with [place]. Please take a moment to fill this out. Your feedback is very important to [place] as you can help [place] better understand your patient needs as well as improve your experience and satisfaction. WE CARE ABOUT YOU!!!   B careful with the low blood sugars If you continue to have problems with right groin pain we may need to schedule you to see the neurosurgeon for further evaluation This winter drink plenty of fluids and stay well hydrated and usually a cool mist humidifier  Arrie Senate MD

## 2016-06-17 LAB — TESTOSTERONE,FREE AND TOTAL
TESTOSTERONE FREE: 3 pg/mL — AB (ref 6.6–18.1)
Testosterone: 250 ng/dL — ABNORMAL LOW (ref 264–916)

## 2016-06-17 LAB — PSA, TOTAL AND FREE
PROSTATE SPECIFIC AG, SERUM: 1.5 ng/mL (ref 0.0–4.0)
PSA FREE: 0.33 ng/mL
PSA, Free Pct: 22 %

## 2016-06-17 LAB — THYROID PANEL WITH TSH
FREE THYROXINE INDEX: 1.8 (ref 1.2–4.9)
T3 Uptake Ratio: 27 % (ref 24–39)
T4, Total: 6.6 ug/dL (ref 4.5–12.0)
TSH: 1.53 u[IU]/mL (ref 0.450–4.500)

## 2016-06-27 ENCOUNTER — Ambulatory Visit (INDEPENDENT_AMBULATORY_CARE_PROVIDER_SITE_OTHER): Payer: Medicare Other | Admitting: Family Medicine

## 2016-06-27 ENCOUNTER — Encounter: Payer: Self-pay | Admitting: Family Medicine

## 2016-06-27 ENCOUNTER — Telehealth: Payer: Self-pay | Admitting: Family Medicine

## 2016-06-27 ENCOUNTER — Other Ambulatory Visit: Payer: Self-pay

## 2016-06-27 VITALS — BP 113/60 | HR 67 | Temp 97.1°F | Ht 70.0 in | Wt 267.8 lb

## 2016-06-27 DIAGNOSIS — M79604 Pain in right leg: Secondary | ICD-10-CM

## 2016-06-27 DIAGNOSIS — N400 Enlarged prostate without lower urinary tract symptoms: Secondary | ICD-10-CM

## 2016-06-27 NOTE — Progress Notes (Signed)
   HPI  Patient presents today with leg pain.  He states he hit his right foot on an uneven piece of pavement causing the leg pain. He states that over the last 6 days this seems to be getting worse. He has knee aching and calf achiness.  States that warm compresses have helped.  He is going into surgery in about 1 week for carpal tunnel release, so he would like to avoid steroid treatments.  He has not tried compression. He has tried ice and Aleve. No Back pain  PMH: Smoking status noted ROS: Per HPI  Objective: BP 113/60   Pulse 67   Temp 97.1 F (36.2 C) (Oral)   Ht 5' 10"  (1.778 m)   Wt 267 lb 12.8 oz (121.5 kg)   BMI 38.43 kg/m  Gen: NAD, alert, cooperative with exam HEENT: NCAT, EOMI, PERRL CV: RRR, good S1/S2, no murmur Resp: CTABL, no wheezes, non-labored Ext: No edema, warm Neuro: Alert and oriented, drink 5/5 and sensation intact in bilateral lower extremities  MSK: R knee without erythema, effusion, bruising, or gross deformity No joint line tenderness.  ligamentously intact to Lachman's and with varus and valgus stress.  Negative McMurray's test No deformity of the ankle or tenderness to palpation   Assessment and plan:  # Right leg pain No signs of red flags for back pain or sciatica Most likely patient does have knee pain after bumping his foot, however consider foot drop. Recommended 2 NSAIDs twice daily 1 week, warm moist heat Compression Return to clinic as needed.    Laroy Apple, MD [facility] 06/27/2016, 4:20 PM

## 2016-06-27 NOTE — Telephone Encounter (Signed)
Patient is using one application under one arm daily, will increase to one application under each arm daily.  Wants to recheck labs in 6 weeks.  Future order placed.

## 2016-06-28 ENCOUNTER — Encounter (HOSPITAL_BASED_OUTPATIENT_CLINIC_OR_DEPARTMENT_OTHER): Payer: Self-pay | Admitting: *Deleted

## 2016-07-05 ENCOUNTER — Ambulatory Visit (HOSPITAL_BASED_OUTPATIENT_CLINIC_OR_DEPARTMENT_OTHER)
Admission: RE | Admit: 2016-07-05 | Discharge: 2016-07-05 | Disposition: A | Discharge status: Home / Self Care | Payer: Medicare Other | Source: Ambulatory Visit | Attending: Orthopedic Surgery | Admitting: Orthopedic Surgery

## 2016-07-05 ENCOUNTER — Encounter (HOSPITAL_BASED_OUTPATIENT_CLINIC_OR_DEPARTMENT_OTHER)
Admission: RE | Disposition: A | Discharge status: Home / Self Care | Payer: Self-pay | Source: Ambulatory Visit | Attending: Orthopedic Surgery

## 2016-07-05 ENCOUNTER — Ambulatory Visit (HOSPITAL_BASED_OUTPATIENT_CLINIC_OR_DEPARTMENT_OTHER): Payer: Medicare Other | Admitting: Certified Registered"

## 2016-07-05 ENCOUNTER — Encounter (HOSPITAL_BASED_OUTPATIENT_CLINIC_OR_DEPARTMENT_OTHER): Payer: Self-pay

## 2016-07-05 DIAGNOSIS — Z87891 Personal history of nicotine dependence: Secondary | ICD-10-CM | POA: Insufficient documentation

## 2016-07-05 DIAGNOSIS — Z7982 Long term (current) use of aspirin: Secondary | ICD-10-CM | POA: Insufficient documentation

## 2016-07-05 DIAGNOSIS — G5602 Carpal tunnel syndrome, left upper limb: Secondary | ICD-10-CM | POA: Insufficient documentation

## 2016-07-05 DIAGNOSIS — E785 Hyperlipidemia, unspecified: Secondary | ICD-10-CM | POA: Diagnosis not present

## 2016-07-05 DIAGNOSIS — I10 Essential (primary) hypertension: Secondary | ICD-10-CM | POA: Diagnosis not present

## 2016-07-05 DIAGNOSIS — Z794 Long term (current) use of insulin: Secondary | ICD-10-CM | POA: Diagnosis not present

## 2016-07-05 DIAGNOSIS — G4730 Sleep apnea, unspecified: Secondary | ICD-10-CM | POA: Diagnosis not present

## 2016-07-05 DIAGNOSIS — K219 Gastro-esophageal reflux disease without esophagitis: Secondary | ICD-10-CM | POA: Insufficient documentation

## 2016-07-05 DIAGNOSIS — E119 Type 2 diabetes mellitus without complications: Secondary | ICD-10-CM | POA: Insufficient documentation

## 2016-07-05 DIAGNOSIS — Z79899 Other long term (current) drug therapy: Secondary | ICD-10-CM | POA: Insufficient documentation

## 2016-07-05 HISTORY — PX: CARPAL TUNNEL RELEASE: SHX101

## 2016-07-05 LAB — GLUCOSE, CAPILLARY
GLUCOSE-CAPILLARY: 103 mg/dL — AB (ref 65–99)
GLUCOSE-CAPILLARY: 117 mg/dL — AB (ref 65–99)

## 2016-07-05 SURGERY — CARPAL TUNNEL RELEASE
Anesthesia: Regional | Site: Wrist | Laterality: Left

## 2016-07-05 MED ORDER — SCOPOLAMINE 1 MG/3DAYS TD PT72: 1.0000 | MEDICATED_PATCH | Freq: Once | TRANSDERMAL | Status: DC | PRN

## 2016-07-05 MED ORDER — FENTANYL CITRATE (PF) 100 MCG/2ML IJ SOLN
50.0000 ug | INTRAMUSCULAR | Status: DC | PRN
  Administered 2016-07-05: 50 ug via INTRAVENOUS

## 2016-07-05 MED ORDER — OXYCODONE HCL 5 MG PO TABS: 5.0000 mg | ORAL_TABLET | Freq: Once | ORAL | Status: DC | PRN

## 2016-07-05 MED ORDER — FENTANYL CITRATE (PF) 100 MCG/2ML IJ SOLN: 25.0000 ug | INTRAMUSCULAR | Status: DC | PRN

## 2016-07-05 MED ORDER — FENTANYL CITRATE (PF) 100 MCG/2ML IJ SOLN
INTRAMUSCULAR | Status: AC
  Filled 2016-07-05: qty 2

## 2016-07-05 MED ORDER — LIDOCAINE 2% (20 MG/ML) 5 ML SYRINGE
INTRAMUSCULAR | Status: AC
  Filled 2016-07-05: qty 5

## 2016-07-05 MED ORDER — MIDAZOLAM HCL 2 MG/2ML IJ SOLN
INTRAMUSCULAR | Status: AC
  Filled 2016-07-05: qty 2

## 2016-07-05 MED ORDER — HYDROMORPHONE HCL 1 MG/ML IJ SOLN: 0.2500 mg | INTRAMUSCULAR | Status: DC | PRN

## 2016-07-05 MED ORDER — OXYCODONE HCL 5 MG/5ML PO SOLN: 5.0000 mg | Freq: Once | ORAL | Status: DC | PRN

## 2016-07-05 MED ORDER — LACTATED RINGERS IV SOLN
INTRAVENOUS | Status: DC
  Administered 2016-07-05: 09:00:00 via INTRAVENOUS

## 2016-07-05 MED ORDER — CHLORHEXIDINE GLUCONATE 4 % EX LIQD: 60.0000 mL | Freq: Once | CUTANEOUS | Status: DC

## 2016-07-05 MED ORDER — PROPOFOL 10 MG/ML IV BOLUS
INTRAVENOUS | Status: DC | PRN
  Administered 2016-07-05: 10 mg via INTRAVENOUS
  Administered 2016-07-05: 30 mg via INTRAVENOUS
  Administered 2016-07-05: 20 mg via INTRAVENOUS

## 2016-07-05 MED ORDER — ONDANSETRON HCL 4 MG/2ML IJ SOLN
INTRAMUSCULAR | Status: DC | PRN
  Administered 2016-07-05: 4 mg via INTRAVENOUS

## 2016-07-05 MED ORDER — MIDAZOLAM HCL 2 MG/2ML IJ SOLN
1.0000 mg | INTRAMUSCULAR | Status: DC | PRN
  Administered 2016-07-05: 1 mg via INTRAVENOUS

## 2016-07-05 MED ORDER — ONDANSETRON HCL 4 MG/2ML IJ SOLN
INTRAMUSCULAR | Status: AC
Start: 2016-07-05 — End: 2016-07-05
  Filled 2016-07-05: qty 2

## 2016-07-05 MED ORDER — CEFAZOLIN SODIUM-DEXTROSE 2-4 GM/100ML-% IV SOLN
2.0000 g | INTRAVENOUS | Status: AC
  Administered 2016-07-05: 2 g via INTRAVENOUS

## 2016-07-05 MED ORDER — BUPIVACAINE HCL (PF) 0.25 % IJ SOLN
INTRAMUSCULAR | Status: DC | PRN
  Administered 2016-07-05: 10 mL

## 2016-07-05 MED ORDER — CEFAZOLIN SODIUM-DEXTROSE 2-4 GM/100ML-% IV SOLN
INTRAVENOUS | Status: AC
  Filled 2016-07-05: qty 100

## 2016-07-05 MED ORDER — HYDROCODONE-ACETAMINOPHEN 5-325 MG PO TABS: 1.0000 | ORAL_TABLET | Freq: Four times a day (QID) | ORAL | 0 refills | Status: DC | PRN

## 2016-07-05 SURGICAL SUPPLY — 35 items
BLADE SURG 15 STRL LF DISP TIS (BLADE) ×1 IMPLANT
BLADE SURG 15 STRL SS (BLADE) ×1
BNDG COHESIVE 3X5 TAN STRL LF (GAUZE/BANDAGES/DRESSINGS) ×4 IMPLANT
BNDG ESMARK 4X9 LF (GAUZE/BANDAGES/DRESSINGS) IMPLANT
BNDG GAUZE ELAST 4 BULKY (GAUZE/BANDAGES/DRESSINGS) ×2 IMPLANT
CHLORAPREP W/TINT 26ML (MISCELLANEOUS) ×2 IMPLANT
CORDS BIPOLAR (ELECTRODE) ×2 IMPLANT
COVER BACK TABLE 60X90IN (DRAPES) ×2 IMPLANT
COVER MAYO STAND STRL (DRAPES) ×2 IMPLANT
CUFF TOURNIQUET SINGLE 18IN (TOURNIQUET CUFF) ×2 IMPLANT
DRAPE EXTREMITY T 121X128X90 (DRAPE) ×2 IMPLANT
DRAPE SURG 17X23 STRL (DRAPES) ×2 IMPLANT
DRSG PAD ABDOMINAL 8X10 ST (GAUZE/BANDAGES/DRESSINGS) ×2 IMPLANT
GAUZE SPONGE 4X4 12PLY STRL (GAUZE/BANDAGES/DRESSINGS) ×2 IMPLANT
GAUZE XEROFORM 1X8 LF (GAUZE/BANDAGES/DRESSINGS) ×2 IMPLANT
GLOVE BIOGEL PI IND STRL 8 (GLOVE) ×1 IMPLANT
GLOVE BIOGEL PI IND STRL 8.5 (GLOVE) ×1 IMPLANT
GLOVE BIOGEL PI INDICATOR 8 (GLOVE) ×1
GLOVE BIOGEL PI INDICATOR 8.5 (GLOVE) ×1
GLOVE SURG ORTHO 8.0 STRL STRW (GLOVE) ×2 IMPLANT
GLOVE SURG SYN 7.5  E (GLOVE) ×1
GLOVE SURG SYN 7.5 E (GLOVE) ×1 IMPLANT
GOWN STRL REUS W/ TWL LRG LVL3 (GOWN DISPOSABLE) IMPLANT
GOWN STRL REUS W/TWL LRG LVL3 (GOWN DISPOSABLE)
GOWN STRL REUS W/TWL XL LVL3 (GOWN DISPOSABLE) ×2 IMPLANT
NEEDLE PRECISIONGLIDE 27X1.5 (NEEDLE) ×2 IMPLANT
NS IRRIG 1000ML POUR BTL (IV SOLUTION) ×2 IMPLANT
PACK BASIN DAY SURGERY FS (CUSTOM PROCEDURE TRAY) ×2 IMPLANT
STOCKINETTE 4X48 STRL (DRAPES) ×2 IMPLANT
SUT ETHILON 4 0 PS 2 18 (SUTURE) ×2 IMPLANT
SUT VICRYL 4-0 PS2 18IN ABS (SUTURE) IMPLANT
SYR BULB 3OZ (MISCELLANEOUS) ×2 IMPLANT
SYR CONTROL 10ML LL (SYRINGE) ×2 IMPLANT
TOWEL OR 17X24 6PK STRL BLUE (TOWEL DISPOSABLE) ×2 IMPLANT
UNDERPAD 30X30 (UNDERPADS AND DIAPERS) IMPLANT

## 2016-07-05 NOTE — Anesthesia Procedure Notes (Signed)
Procedure Name: MAC Date/Time: 07/05/2016 9:37 AM Performed by: Baxter Flattery Pre-anesthesia Checklist: Patient identified, Emergency Drugs available, Suction available and Patient being monitored Patient Re-evaluated:Patient Re-evaluated prior to inductionOxygen Delivery Method: Simple face mask Preoxygenation: Pre-oxygenation with 100% oxygen Intubation Type: IV induction

## 2016-07-05 NOTE — Discharge Instructions (Addendum)

## 2016-07-05 NOTE — Transfer of Care (Signed)
Immediate Anesthesia Transfer of Care Note  Patient: Tony Cantu  Procedure(s) Performed: Procedure(s): LEFT CARPAL TUNNEL RELEASE (Left)  Patient Location: PACU  Anesthesia Type:MAC and Bier block  Level of Consciousness: awake, alert , oriented and patient cooperative  Airway & Oxygen Therapy: Patient Spontanous Breathing and Patient connected to face mask oxygen  Post-op Assessment: Report given to RN, Post -op Vital signs reviewed and stable and Patient moving all extremities  Post vital signs: Reviewed and stable  Last Vitals:  Vitals:   07/05/16 0843  BP: 106/60  Pulse: (!) 54  Resp: 20  Temp: 36.8 C    Last Pain:  Vitals:   07/05/16 0843  TempSrc: Oral         Complications: No apparent anesthesia complications

## 2016-07-05 NOTE — H&P (Signed)
Tony Cantu is an 65 y.o. male.   Chief Complaint: numbness left hand HPI: Tony Cantu is a 65 year old right-hand dominant male who comes in complaining of numbness and tingling in his right hand to a lesser extent his left side. This has been going on for at least two years. He had nerve conductions done by Dr. Jannifer Franklin in 2015 these ar personally reviewd revealing mild carpal tunnel. He has a history of cervical spine disc, which has been followed by the neurosurgeons in [place]. He has a history of a fracture of his right wrist in third to fourth grade. He states he is not awakened at night. He is wearing braces at night. He has a history of diabetes and arthritis. There is no history of thyroid problems or gout. He is not taking any medicines per se for this.He has had his nerve conductions performed by Dr. Ileene Rubens. These were performed on 02/02 revealing bilateral carpal tunnel syndrome, right greater than left with an ulnar nerve change, likely due to underlying peripheral neuropathy. His actual study is reviewed personally with a median nerve latency of 6.4 on the right side, 4.8 on the left, a motor latency of 5.6 on the right, 4.17 on the left. He underwent right carpal tunnel release spring 2017. He is done quite well since that time in regards to the right hand. He had complete resolution of his numbness and t ingling in the right side.              Past Medical History:  Diagnosis Date  . Arthritis   . Asthma   . BPH (benign prostatic hypertrophy)   . Colon polyps   . Depression    no meds  . Gastritis   . GERD (gastroesophageal reflux disease)   . Hypertension   . IDDM (insulin dependent diabetes mellitus) ([place])   . Sleep apnea    uses CPAP nightly  . Testicle trouble    both     Past Surgical History:  Procedure Laterality Date  . BACK SURGERY    . CARPAL TUNNEL RELEASE Right 10/13/2015   Procedure: RIGHT CARPAL TUNNEL RELEASE;  Surgeon: Daryll Brod, MD;   Location: [facility];  Service: Orthopedics;  Laterality: Right;  . HERNIA REPAIR    . INGUINAL HERNIA REPAIR  2003   right   . KNEE ARTHROSCOPY Right 03/03/2016  . LUMBAR DISC SURGERY  8/96   Dr. Coralyn Mark, discectomy  . SHOULDER SURGERY  11/28/05   left partial  . SHOULDER SURGERY  07/14/2006   RIGHT  . TONSILECTOMY, ADENOIDECTOMY, BILATERAL MYRINGOTOMY AND TUBES      Family History  Problem Relation Age of Onset  . Heart failure Mother   . Heart attack Mother     in her 62's  . Asthma Father   . Suicidality Father     in his 12's  . Allergic rhinitis Sister   . Heart failure Maternal Grandmother   . Rheum arthritis Maternal Grandmother   . Breast cancer Maternal Grandmother   . Heart attack Maternal Grandmother     in her 62's   Social History:  reports that he quit smoking about 14 years ago. His smoking use included Cigarettes and Pipe. He quit after 34.00 years of use. He has never used smokeless tobacco. He reports that he drinks alcohol. He reports that he does not use drugs.  Allergies:  Allergies  Allergen Reactions  . Erythromycin   . Sulfonamide Derivatives  No prescriptions prior to admission.    No results found for this or any previous visit (from the past 48 hour(s)).  No results found.   Pertinent items are noted in HPI.  Height 5' 10"  (1.778 m), weight 121.6 kg (268 lb).  General appearance: alert, cooperative and appears stated age Head: Normocephalic, without obvious abnormality Neck: no JVD Resp: clear to auscultation bilaterally Cardio: regular rate and rhythm, S1, S2 normal, no murmur, click, rub or gallop GI: soft, non-tender; bowel sounds normal; no masses,  no organomegaly Extremities: numbness left hand Pulses: 2+ and symmetric Skin: Skin color, texture, turgor normal. No rashes or lesions Neurologic: Grossly normal Incision/Wound: na  Assessment/Plan Assessment: Carpal tunnel syndrome on left  Plan: He would  like to proceed to have his left carpal tunnel release. Prepare postoperative course were discussed along with risk applications. He is aware that there is no guarantee to the surgery the possibility of infection recurrence injury to arteries nerves tendons incomplete relief of symptoms and dystrophy. He is scheduled for left carpal tunnel release and outpatient under regional anesthesia.      Charli Halle R 07/05/2016, 4:47 AM

## 2016-07-05 NOTE — Anesthesia Preprocedure Evaluation (Addendum)
Anesthesia Evaluation  Patient identified by MRN, date of birth, ID band Patient awake    Reviewed: Allergy & Precautions, NPO status , Patient's Chart, lab work & pertinent test results  Airway Mallampati: III  TM Distance: >3 FB Neck ROM: Full    Dental  (+) Teeth Intact   Pulmonary asthma , sleep apnea , former smoker,    breath sounds clear to auscultation       Cardiovascular hypertension,  Rhythm:Regular Rate:Normal     Neuro/Psych    GI/Hepatic GERD  ,  Endo/Other  diabetesMorbid obesity  Renal/GU      Musculoskeletal  (+) Arthritis ,   Abdominal   Peds  Hematology   Anesthesia Other Findings   Reproductive/Obstetrics                            Anesthesia Physical Anesthesia Plan  ASA: III  Anesthesia Plan: Bier Block and Regional   Post-op Pain Management:    Induction: Intravenous  Airway Management Planned: Simple Face Mask and Natural Airway  Additional Equipment:   Intra-op Plan:   Post-operative Plan:   Informed Consent: I have reviewed the patients History and Physical, chart, labs and discussed the procedure including the risks, benefits and alternatives for the proposed anesthesia with the patient or authorized representative who has indicated his/her understanding and acceptance.     Plan Discussed with: CRNA  Anesthesia Plan Comments:         Anesthesia Quick Evaluation

## 2016-07-05 NOTE — Op Note (Signed)
NAMEJAQUAVIOUS, Tony Cantu NO.:  [id number]  MEDICAL RECORD NO.:  [id number]  LOCATION:                                 FACILITY:  PHYSICIAN:  Daryll Brod, M.D.       DATE OF BIRTH:  1951-05-09  DATE OF PROCEDURE:  07/05/2016 DATE OF DISCHARGE:                              OPERATIVE REPORT   PREOPERATIVE DIAGNOSIS:  Carpal tunnel syndrome, left hand.  POSTOPERATIVE DIAGNOSIS:  Carpal tunnel syndrome, left hand.  OPERATION:  Decompression, left median nerve.  SURGEON:  Daryll Brod, M.D.  ANESTHESIA:  Forearm-based IV regional with local infiltration.  ANESTHESIOLOGIST:  Ala Dach, M.D.  PLACE OF SURGERY:  [facility].  HISTORY:  The patient is a 65 year old male with a history of bilateral carpal tunnel syndrome.  EMG and nerve conductions are positive.  He has undergone release of his right side.  He is admitted now for release of his left.  He is aware of risks and complications including infection, recurrence of injury to arteries, nerves, tendons; and incomplete relief of symptoms with dystrophy.  DESCRIPTION OF PROCEDURE:  The patient was seen in the preoperative area, the extremity marked by both the patient and surgeon, and antibiotic given.  He was brought to the operating room, where a forearm- based IV regional anesthetic was carried out without difficulty.  He was prepped using ChloraPrep in a supine position with the left arm free.  A 3-minute dry time was allowed.  Time-out was taken confirming the patient and procedure.  After adequate anesthesia was afforded, a longitudinal incision was made in the left palm.  This was carried down through subcutaneous tissue.  Bleeders were electrocauterized with bipolar.  The palmar fascia was split.  The superficial palmar arch was identified.  The flexor tendon to the ring and little finger was identified.  Sharp dissection to the flexor retinaculum was then incised.  This allowed  visualization of the canal.  A right angle and Sewall retractor were placed between skin and forearm fascia.  This was done to separate the 2 layers.  The median nerve was then dissected free dorsal to the distal fascia.  The distal fascia and remainder of the flexor retinaculum was then incised with blunt scissors taking care to protect the median nerve.  The canal was explored.  The area compression to the nerve was apparent.  Motor branch entered into muscle distally. No further lesions were identified.  The wound was copiously irrigated with saline.  The skin was then closed with interrupted 4-0 nylon sutures.  A local infiltration with 0.25% bupivacaine without epinephrine was given, 10 mL was used.  A sterile compressive dressing with the fingers free was applied.  On deflation of the tourniquet, all fingers immediately pinked.  He was taken to the recovery room for observation in a satisfactory condition.  He will be discharged to home to return to the [facility] in 1 week on Norco.          ______________________________ Daryll Brod, M.D.     GK/MEDQ  D:  07/05/2016  T:  07/05/2016  Job:  [id number]

## 2016-07-05 NOTE — Op Note (Signed)
Dictation Number [id number]

## 2016-07-05 NOTE — Anesthesia Postprocedure Evaluation (Signed)
Anesthesia Post Note  Patient: Tony Cantu  Procedure(s) Performed: Procedure(s) (LRB): LEFT CARPAL TUNNEL RELEASE (Left)  Patient location during evaluation: PACU Anesthesia Type: MAC and Regional Level of consciousness: awake and alert Pain management: pain level controlled Vital Signs Assessment: post-procedure vital signs reviewed and stable Respiratory status: spontaneous breathing, nonlabored ventilation, respiratory function stable and patient connected to nasal cannula oxygen Cardiovascular status: stable and blood pressure returned to baseline Anesthetic complications: no    Last Vitals:  Vitals:   07/05/16 1030 07/05/16 1045  BP: 103/67   Pulse: (!) 54 (!) 51  Resp: 17 18  Temp:      Last Pain:  Vitals:   07/05/16 1030  TempSrc:   PainSc: 0-No pain                 Aryanne Gilleland,JAMES TERRILL

## 2016-07-05 NOTE — Brief Op Note (Signed)
07/05/2016  10:08 AM  PATIENT:  [name]  65 y.o. male  PRE-OPERATIVE DIAGNOSIS:  Left Carpal Tunnel Syndrome   G56.02  POST-OPERATIVE DIAGNOSIS:  Left Carpal Tunnel Syndrome   G56.02  PROCEDURE:  Procedure(s): LEFT CARPAL TUNNEL RELEASE (Left)  SURGEON:  Surgeon(s) and Role:    * Daryll Brod, MD - Primary  PHYSICIAN ASSISTANT:   ASSISTANTS: none   ANESTHESIA:   local and regional  EBL:  Total I/O In: 600 [I.V.:600] Out: 1 [Blood:1]  BLOOD ADMINISTERED:none  DRAINS: none   LOCAL MEDICATIONS USED:  BUPIVICAINE   SPECIMEN:  No Specimen  DISPOSITION OF SPECIMEN:  N/A  COUNTS:  YES  TOURNIQUET:   Total Tourniquet Time Documented: Forearm (Left) - 25 minutes Total: Forearm (Left) - 25 minutes   DICTATION: .Other Dictation: Dictation Number [id number]  PLAN OF CARE: Discharge to home after PACU  PATIENT DISPOSITION:  PACU - hemodynamically stable.

## 2016-07-06 ENCOUNTER — Encounter (HOSPITAL_BASED_OUTPATIENT_CLINIC_OR_DEPARTMENT_OTHER): Payer: Self-pay | Admitting: Orthopedic Surgery

## 2016-07-07 ENCOUNTER — Encounter: Payer: Self-pay | Admitting: Family Medicine

## 2016-07-11 DIAGNOSIS — Z9889 Other specified postprocedural states: Secondary | ICD-10-CM | POA: Diagnosis not present

## 2016-07-11 DIAGNOSIS — Z4789 Encounter for other orthopedic aftercare: Secondary | ICD-10-CM | POA: Diagnosis not present

## 2016-07-11 DIAGNOSIS — M545 Low back pain: Secondary | ICD-10-CM | POA: Diagnosis not present

## 2016-08-05 DIAGNOSIS — Z9889 Other specified postprocedural states: Secondary | ICD-10-CM | POA: Diagnosis not present

## 2016-08-05 DIAGNOSIS — M5136 Other intervertebral disc degeneration, lumbar region: Secondary | ICD-10-CM | POA: Diagnosis not present

## 2016-08-05 DIAGNOSIS — M546 Pain in thoracic spine: Secondary | ICD-10-CM | POA: Diagnosis not present

## 2016-08-05 DIAGNOSIS — M4726 Other spondylosis with radiculopathy, lumbar region: Secondary | ICD-10-CM | POA: Diagnosis not present

## 2016-08-05 DIAGNOSIS — M549 Dorsalgia, unspecified: Secondary | ICD-10-CM | POA: Diagnosis not present

## 2016-08-05 DIAGNOSIS — M5416 Radiculopathy, lumbar region: Secondary | ICD-10-CM | POA: Diagnosis not present

## 2016-08-05 DIAGNOSIS — M4155 Other secondary scoliosis, thoracolumbar region: Secondary | ICD-10-CM | POA: Diagnosis not present

## 2016-08-06 ENCOUNTER — Telehealth: Payer: Self-pay | Admitting: Pediatrics

## 2016-08-06 ENCOUNTER — Encounter (HOSPITAL_COMMUNITY): Payer: Self-pay | Admitting: Cardiology

## 2016-08-06 ENCOUNTER — Inpatient Hospital Stay (HOSPITAL_COMMUNITY)
Admission: EM | Admit: 2016-08-06 | Discharge: 2016-08-10 | DRG: 871 | Disposition: A | Discharge status: Home / Self Care | Payer: Medicare Other | Attending: Internal Medicine | Admitting: Internal Medicine

## 2016-08-06 ENCOUNTER — Emergency Department (HOSPITAL_COMMUNITY): Payer: Medicare Other

## 2016-08-06 DIAGNOSIS — Z888 Allergy status to other drugs, medicaments and biological substances status: Secondary | ICD-10-CM | POA: Diagnosis not present

## 2016-08-06 DIAGNOSIS — Z882 Allergy status to sulfonamides status: Secondary | ICD-10-CM | POA: Diagnosis not present

## 2016-08-06 DIAGNOSIS — Z7982 Long term (current) use of aspirin: Secondary | ICD-10-CM

## 2016-08-06 DIAGNOSIS — A045 Campylobacter enteritis: Secondary | ICD-10-CM | POA: Diagnosis not present

## 2016-08-06 DIAGNOSIS — N179 Acute kidney failure, unspecified: Secondary | ICD-10-CM

## 2016-08-06 DIAGNOSIS — Z79899 Other long term (current) drug therapy: Secondary | ICD-10-CM

## 2016-08-06 DIAGNOSIS — R6521 Severe sepsis with septic shock: Secondary | ICD-10-CM | POA: Diagnosis present

## 2016-08-06 DIAGNOSIS — Z8249 Family history of ischemic heart disease and other diseases of the circulatory system: Secondary | ICD-10-CM

## 2016-08-06 DIAGNOSIS — R17 Unspecified jaundice: Secondary | ICD-10-CM | POA: Diagnosis present

## 2016-08-06 DIAGNOSIS — A419 Sepsis, unspecified organism: Secondary | ICD-10-CM | POA: Diagnosis not present

## 2016-08-06 DIAGNOSIS — W19XXXA Unspecified fall, initial encounter: Secondary | ICD-10-CM | POA: Diagnosis present

## 2016-08-06 DIAGNOSIS — A09 Infectious gastroenteritis and colitis, unspecified: Secondary | ICD-10-CM

## 2016-08-06 DIAGNOSIS — Z794 Long term (current) use of insulin: Secondary | ICD-10-CM

## 2016-08-06 DIAGNOSIS — G4733 Obstructive sleep apnea (adult) (pediatric): Secondary | ICD-10-CM | POA: Diagnosis present

## 2016-08-06 DIAGNOSIS — R509 Fever, unspecified: Secondary | ICD-10-CM | POA: Diagnosis not present

## 2016-08-06 DIAGNOSIS — E119 Type 2 diabetes mellitus without complications: Secondary | ICD-10-CM | POA: Diagnosis present

## 2016-08-06 DIAGNOSIS — E1122 Type 2 diabetes mellitus with diabetic chronic kidney disease: Secondary | ICD-10-CM | POA: Diagnosis not present

## 2016-08-06 DIAGNOSIS — I4891 Unspecified atrial fibrillation: Secondary | ICD-10-CM | POA: Diagnosis not present

## 2016-08-06 DIAGNOSIS — Z86010 Personal history of colonic polyps: Secondary | ICD-10-CM | POA: Diagnosis not present

## 2016-08-06 DIAGNOSIS — S92591A Other fracture of right lesser toe(s), initial encounter for closed fracture: Secondary | ICD-10-CM | POA: Diagnosis present

## 2016-08-06 DIAGNOSIS — Z87891 Personal history of nicotine dependence: Secondary | ICD-10-CM | POA: Diagnosis not present

## 2016-08-06 DIAGNOSIS — R531 Weakness: Secondary | ICD-10-CM | POA: Diagnosis not present

## 2016-08-06 DIAGNOSIS — I493 Ventricular premature depolarization: Secondary | ICD-10-CM | POA: Diagnosis not present

## 2016-08-06 DIAGNOSIS — R404 Transient alteration of awareness: Secondary | ICD-10-CM | POA: Diagnosis not present

## 2016-08-06 DIAGNOSIS — E872 Acidosis, unspecified: Secondary | ICD-10-CM | POA: Diagnosis present

## 2016-08-06 DIAGNOSIS — M6281 Muscle weakness (generalized): Secondary | ICD-10-CM

## 2016-08-06 DIAGNOSIS — R579 Shock, unspecified: Secondary | ICD-10-CM | POA: Diagnosis present

## 2016-08-06 DIAGNOSIS — R197 Diarrhea, unspecified: Secondary | ICD-10-CM | POA: Diagnosis present

## 2016-08-06 DIAGNOSIS — I10 Essential (primary) hypertension: Secondary | ICD-10-CM | POA: Diagnosis not present

## 2016-08-06 DIAGNOSIS — R0602 Shortness of breath: Secondary | ICD-10-CM | POA: Diagnosis not present

## 2016-08-06 DIAGNOSIS — R630 Anorexia: Secondary | ICD-10-CM | POA: Diagnosis present

## 2016-08-06 DIAGNOSIS — R58 Hemorrhage, not elsewhere classified: Secondary | ICD-10-CM

## 2016-08-06 DIAGNOSIS — Z833 Family history of diabetes mellitus: Secondary | ICD-10-CM | POA: Diagnosis not present

## 2016-08-06 DIAGNOSIS — N182 Chronic kidney disease, stage 2 (mild): Secondary | ICD-10-CM

## 2016-08-06 DIAGNOSIS — E785 Hyperlipidemia, unspecified: Secondary | ICD-10-CM | POA: Diagnosis present

## 2016-08-06 DIAGNOSIS — R061 Stridor: Secondary | ICD-10-CM | POA: Diagnosis not present

## 2016-08-06 DIAGNOSIS — S92251A Displaced fracture of navicular [scaphoid] of right foot, initial encounter for closed fracture: Secondary | ICD-10-CM | POA: Diagnosis not present

## 2016-08-06 HISTORY — DX: Acute kidney failure, unspecified: N17.9

## 2016-08-06 HISTORY — DX: Type 2 diabetes mellitus without complications: E11.9

## 2016-08-06 LAB — COMPREHENSIVE METABOLIC PANEL
ALBUMIN: 3.7 g/dL (ref 3.5–5.0)
ALK PHOS: 68 U/L (ref 38–126)
ALT: 28 U/L (ref 17–63)
AST: 32 U/L (ref 15–41)
Anion gap: 11 (ref 5–15)
BUN: 24 mg/dL — ABNORMAL HIGH (ref 6–20)
CALCIUM: 8.7 mg/dL — AB (ref 8.9–10.3)
CHLORIDE: 104 mmol/L (ref 101–111)
CO2: 23 mmol/L (ref 22–32)
CREATININE: 1.38 mg/dL — AB (ref 0.61–1.24)
GFR calc Af Amer: 60 mL/min — ABNORMAL LOW (ref >60)
GFR calc non Af Amer: 52 mL/min — ABNORMAL LOW (ref >60)
GLUCOSE: 121 mg/dL — AB (ref 65–99)
Potassium: 3.8 mmol/L (ref 3.5–5.1)
SODIUM: 138 mmol/L (ref 135–145)
Total Bilirubin: 1.4 mg/dL — ABNORMAL HIGH (ref 0.3–1.2)
Total Protein: 7.2 g/dL (ref 6.5–8.1)

## 2016-08-06 LAB — CBC WITH DIFFERENTIAL/PLATELET
BASOS PCT: 0 %
Basophils Absolute: 0 10*3/uL (ref 0.0–0.1)
EOS ABS: 0 10*3/uL (ref 0.0–0.7)
Eosinophils Relative: 0 %
HEMATOCRIT: 51.6 % (ref 39.0–52.0)
HEMOGLOBIN: 17.2 g/dL — AB (ref 13.0–17.0)
LYMPHS ABS: 0.7 10*3/uL (ref 0.7–4.0)
Lymphocytes Relative: 5 %
MCH: 35 pg — AB (ref 26.0–34.0)
MCHC: 33.3 g/dL (ref 30.0–36.0)
MCV: 104.9 fL — ABNORMAL HIGH (ref 78.0–100.0)
MONO ABS: 2 10*3/uL — AB (ref 0.1–1.0)
Monocytes Relative: 13 %
NEUTROS ABS: 12.1 10*3/uL — AB (ref 1.7–7.7)
Neutrophils Relative %: 82 %
Platelets: 126 10*3/uL — ABNORMAL LOW (ref 150–400)
RBC: 4.92 MIL/uL (ref 4.22–5.81)
RDW: 13.6 % (ref 11.5–15.5)
WBC: 14.8 10*3/uL — ABNORMAL HIGH (ref 4.0–10.5)

## 2016-08-06 LAB — GLUCOSE, CAPILLARY
Glucose-Capillary: 152 mg/dL — ABNORMAL HIGH (ref 65–99)
Glucose-Capillary: 172 mg/dL — ABNORMAL HIGH (ref 65–99)

## 2016-08-06 LAB — INFLUENZA PANEL BY PCR (TYPE A & B)
INFLAPCR: NEGATIVE
INFLBPCR: NEGATIVE

## 2016-08-06 LAB — URINALYSIS, ROUTINE W REFLEX MICROSCOPIC
BILIRUBIN URINE: NEGATIVE
Glucose, UA: NEGATIVE mg/dL
HGB URINE DIPSTICK: NEGATIVE
KETONES UR: NEGATIVE mg/dL
Leukocytes, UA: NEGATIVE
NITRITE: NEGATIVE
PROTEIN: NEGATIVE mg/dL
SPECIFIC GRAVITY, URINE: 1.013 (ref 1.005–1.030)
pH: 5 (ref 5.0–8.0)

## 2016-08-06 LAB — C DIFFICILE QUICK SCREEN W PCR REFLEX
C Diff antigen: POSITIVE — AB
C Diff toxin: NEGATIVE

## 2016-08-06 LAB — ROTAVIRUS ANTIGEN, STOOL: Rotavirus: NEGATIVE

## 2016-08-06 LAB — LACTIC ACID, PLASMA
LACTIC ACID, VENOUS: 1.7 mmol/L (ref 0.5–1.9)
LACTIC ACID, VENOUS: 2.6 mmol/L — AB (ref 0.5–1.9)

## 2016-08-06 LAB — I-STAT CG4 LACTIC ACID, ED: Lactic Acid, Venous: 2.7 mmol/L (ref 0.5–1.9)

## 2016-08-06 MED ORDER — ONDANSETRON HCL 4 MG/2ML IJ SOLN: 4.0000 mg | Freq: Four times a day (QID) | INTRAMUSCULAR | Status: DC | PRN

## 2016-08-06 MED ORDER — ACETAMINOPHEN 325 MG PO TABS
650.0000 mg | ORAL_TABLET | Freq: Four times a day (QID) | ORAL | Status: DC | PRN
  Administered 2016-08-07 – 2016-08-10 (×8): 650 mg via ORAL
  Filled 2016-08-06 (×8): qty 2

## 2016-08-06 MED ORDER — INSULIN ASPART 100 UNIT/ML ~~LOC~~ SOLN
0.0000 [IU] | Freq: Four times a day (QID) | SUBCUTANEOUS | Status: DC
  Administered 2016-08-06: 4 [IU] via SUBCUTANEOUS
  Administered 2016-08-07: 7 [IU] via SUBCUTANEOUS
  Administered 2016-08-07 (×2): 4 [IU] via SUBCUTANEOUS
  Administered 2016-08-08: 11 [IU] via SUBCUTANEOUS

## 2016-08-06 MED ORDER — PANTOPRAZOLE SODIUM 40 MG PO TBEC
40.0000 mg | DELAYED_RELEASE_TABLET | Freq: Every day | ORAL | Status: DC
  Administered 2016-08-07 – 2016-08-10 (×4): 40 mg via ORAL
  Filled 2016-08-06 (×4): qty 1

## 2016-08-06 MED ORDER — DEXTROSE 5 % IV SOLN
2.0000 ug/min | INTRAVENOUS | Status: DC
  Administered 2016-08-06: 2 ug/min via INTRAVENOUS

## 2016-08-06 MED ORDER — FAMOTIDINE 20 MG PO TABS
20.0000 mg | ORAL_TABLET | Freq: Two times a day (BID) | ORAL | Status: DC
  Administered 2016-08-06 – 2016-08-10 (×8): 20 mg via ORAL
  Filled 2016-08-06 (×8): qty 1

## 2016-08-06 MED ORDER — ASPIRIN EC 325 MG PO TBEC
325.0000 mg | DELAYED_RELEASE_TABLET | Freq: Every day | ORAL | Status: DC
  Administered 2016-08-07 – 2016-08-08 (×2): 325 mg via ORAL
  Filled 2016-08-06 (×2): qty 1

## 2016-08-06 MED ORDER — SODIUM CHLORIDE 0.9 % IV BOLUS (SEPSIS)
500.0000 mL | Freq: Once | INTRAVENOUS | Status: AC
  Administered 2016-08-06: 500 mL via INTRAVENOUS

## 2016-08-06 MED ORDER — ACETAMINOPHEN 325 MG PO TABS
ORAL_TABLET | ORAL | Status: AC
  Administered 2016-08-06: 650 mg via ORAL
  Filled 2016-08-06: qty 2

## 2016-08-06 MED ORDER — ACETAMINOPHEN 650 MG RE SUPP: 650.0000 mg | Freq: Four times a day (QID) | RECTAL | Status: DC | PRN

## 2016-08-06 MED ORDER — ORAL CARE MOUTH RINSE
15.0000 mL | Freq: Two times a day (BID) | OROMUCOSAL | Status: DC
  Administered 2016-08-07 – 2016-08-10 (×6): 15 mL via OROMUCOSAL

## 2016-08-06 MED ORDER — PIPERACILLIN-TAZOBACTAM 3.375 G IVPB 30 MIN
3.3750 g | Freq: Once | INTRAVENOUS | Status: AC
  Administered 2016-08-06: 3.375 g via INTRAVENOUS
  Filled 2016-08-06: qty 50

## 2016-08-06 MED ORDER — VANCOMYCIN HCL 10 G IV SOLR
1250.0000 mg | Freq: Two times a day (BID) | INTRAVENOUS | Status: DC
  Administered 2016-08-06 – 2016-08-08 (×4): 1250 mg via INTRAVENOUS
  Filled 2016-08-06 (×6): qty 1250

## 2016-08-06 MED ORDER — SODIUM CHLORIDE 0.9 % IV BOLUS (SEPSIS)
1000.0000 mL | Freq: Once | INTRAVENOUS | Status: AC
  Administered 2016-08-06: 1000 mL via INTRAVENOUS

## 2016-08-06 MED ORDER — OSELTAMIVIR PHOSPHATE 75 MG PO CAPS: 75.0000 mg | ORAL_CAPSULE | Freq: Two times a day (BID) | ORAL | 0 refills | Status: DC

## 2016-08-06 MED ORDER — POTASSIUM CHLORIDE IN NACL 40-0.9 MEQ/L-% IV SOLN
INTRAVENOUS | Status: DC
  Administered 2016-08-06: 125 mL/h via INTRAVENOUS

## 2016-08-06 MED ORDER — SODIUM CHLORIDE 0.9 % IV BOLUS (SEPSIS)
2500.0000 mL | Freq: Once | INTRAVENOUS | Status: AC
  Administered 2016-08-06: 2500 mL via INTRAVENOUS

## 2016-08-06 MED ORDER — TRAMADOL HCL 50 MG PO TABS: 50.0000 mg | ORAL_TABLET | Freq: Four times a day (QID) | ORAL | Status: DC | PRN

## 2016-08-06 MED ORDER — HYDROCODONE-ACETAMINOPHEN 5-325 MG PO TABS
1.0000 | ORAL_TABLET | Freq: Four times a day (QID) | ORAL | Status: DC | PRN
  Administered 2016-08-07 – 2016-08-08 (×3): 1 via ORAL
  Filled 2016-08-06 (×3): qty 1

## 2016-08-06 MED ORDER — INSULIN GLARGINE 100 UNIT/ML ~~LOC~~ SOLN
35.0000 [IU] | Freq: Two times a day (BID) | SUBCUTANEOUS | Status: DC
  Administered 2016-08-06 – 2016-08-09 (×6): 35 [IU] via SUBCUTANEOUS
  Filled 2016-08-06 (×8): qty 0.35

## 2016-08-06 MED ORDER — VANCOMYCIN HCL IN DEXTROSE 1-5 GM/200ML-% IV SOLN
1000.0000 mg | Freq: Once | INTRAVENOUS | Status: AC
  Administered 2016-08-06: 1000 mg via INTRAVENOUS
  Filled 2016-08-06: qty 200

## 2016-08-06 MED ORDER — ENOXAPARIN SODIUM 40 MG/0.4ML ~~LOC~~ SOLN
40.0000 mg | SUBCUTANEOUS | Status: DC
  Administered 2016-08-06 – 2016-08-07 (×2): 40 mg via SUBCUTANEOUS
  Filled 2016-08-06 (×2): qty 0.4

## 2016-08-06 MED ORDER — IRBESARTAN 150 MG PO TABS: 150.0000 mg | ORAL_TABLET | Freq: Every day | ORAL | Status: DC

## 2016-08-06 MED ORDER — NOREPINEPHRINE BITARTRATE 1 MG/ML IV SOLN
INTRAVENOUS | Status: AC
  Filled 2016-08-06: qty 4

## 2016-08-06 MED ORDER — LORATADINE 10 MG PO TABS
10.0000 mg | ORAL_TABLET | Freq: Every day | ORAL | Status: DC
Start: 2016-08-07 — End: 2016-08-08
  Administered 2016-08-07 – 2016-08-08 (×2): 10 mg via ORAL
  Filled 2016-08-06 (×3): qty 1

## 2016-08-06 MED ORDER — ATORVASTATIN CALCIUM 40 MG PO TABS
40.0000 mg | ORAL_TABLET | Freq: Every day | ORAL | Status: DC
  Administered 2016-08-07 – 2016-08-10 (×4): 40 mg via ORAL
  Filled 2016-08-06 (×4): qty 1

## 2016-08-06 MED ORDER — PIPERACILLIN-TAZOBACTAM 3.375 G IVPB
3.3750 g | Freq: Three times a day (TID) | INTRAVENOUS | Status: DC
  Administered 2016-08-06 – 2016-08-08 (×6): 3.375 g via INTRAVENOUS
  Filled 2016-08-06 (×6): qty 50

## 2016-08-06 MED ORDER — ACETAMINOPHEN 325 MG PO TABS
650.0000 mg | ORAL_TABLET | Freq: Once | ORAL | Status: AC
  Administered 2016-08-06: 650 mg via ORAL

## 2016-08-06 MED ORDER — ONDANSETRON HCL 4 MG PO TABS: 4.0000 mg | ORAL_TABLET | Freq: Four times a day (QID) | ORAL | Status: DC | PRN

## 2016-08-06 MED ORDER — MONTELUKAST SODIUM 10 MG PO TABS
10.0000 mg | ORAL_TABLET | Freq: Every day | ORAL | Status: DC
  Administered 2016-08-06 – 2016-08-09 (×4): 10 mg via ORAL
  Filled 2016-08-06 (×5): qty 1

## 2016-08-06 NOTE — ED Triage Notes (Signed)
Fever times 2 days.  Diarrhea since this morning.  Syncope times 2 this morning when trying to stand up. CBG 141.  B/p 81/48.  Had 700 cc bolus with EMS.

## 2016-08-06 NOTE — ED Notes (Signed)
States the first time he passed out he went down on his right knee.  C/o right knee pain.  Right knee arthroscopy in the past year.

## 2016-08-06 NOTE — ED Notes (Signed)
Pt c/o feeling achy all over.

## 2016-08-06 NOTE — ED Notes (Signed)
Placed on nasal cannula oxygen 2 liters.

## 2016-08-06 NOTE — Telephone Encounter (Signed)
Wife called in, pt started having temp to 104 yesterday, achey, headache, a few episodes of loose stools. COughing. Others at work have tested positive for flu. Clinic unexpectedly closed due to weather. Will send in tamiflu. Discussed return precautions, any worsening needs to be seen immediately. Tylenol for fever. Lots of fluids.

## 2016-08-06 NOTE — ED Provider Notes (Signed)
[facility] [facility] DEPT Provider Note   CSN: [id number] Arrival date & time: 08/06/16  1305     History   Chief Complaint Chief Complaint  Patient presents with  . Fever    HPI Tony Cantu is a 65 y.o. male.  HPI Patient is a well-developed fever up to 104 at 3 AM this morning. Was given Tylenol and ibuprofen with resolution of fever. Has had 3 loose stools since this morning. Nonbloody nonbilious stool. No nausea or vomiting. Patient got up to go the bathroom and became feeling lightheaded. Wife states the patient passed out but she gradually lowered him to the ground. No head or neck trauma. No seizure activity. Patient patient had another brief episode of loss of consciousness. Arrived patient was found to have a blood pressure in the 80s. CBG was 141. Was given 700 mL of IV fluids en route. Patient denies any lightheadedness currently. Denies any chest pain or shortness of breath. Has chronic cough which is unchanged. No abdominal pain. Denies any urinary symptoms. No sick contacts. Was prescribed Tamiflu by PMD for possible flu.` Past Medical History:  Diagnosis Date  . Arthritis   . Asthma   . BPH (benign prostatic hypertrophy)   . Colon polyps   . Depression    no meds  . Gastritis   . GERD (gastroesophageal reflux disease)   . Hypertension   . IDDM (insulin dependent diabetes mellitus) ([place])   . Sleep apnea    uses CPAP nightly  . Testicle trouble    both     Patient Active Problem List   Diagnosis Date Noted  . Acute renal injury ([place]) 08/06/2016  . Diarrhea 08/06/2016  . Fever 08/06/2016  . Hyperbilirubinemia 08/06/2016  . Septic shock ([place]) 08/06/2016  . Lactic acidosis 08/06/2016  . Type 2 diabetes mellitus with hyperlipidemia ([place]) 01/18/2016  . Inguinal hernia 12/10/2015  . Right groin pain 11/30/2015  . Thrombocytopenia ([place]) 07/21/2014  . Bilateral carotid bruits 05/11/2014  . Vitamin D deficiency 09/17/2013  . BPH (benign prostatic hyperplasia)  05/22/2013  . 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 Diabetes type 2, controlled ([place]) 01/10/2013  . Essential hypertension, benign 01/10/2013  . OSA (obstructive sleep apnea) 05/16/2012  . Edema 02/21/2012  . At risk for coronary artery disease 03/20/2011  . Obesity 03/20/2011  . WEIGHT GAIN, ABNORMAL 11/09/2010  . Hyperlipidemia 06/22/2010  . ALLERGIC RHINITIS 06/22/2010  . ASTHMA 06/22/2010  . COUGH 06/22/2010    Past Surgical History:  Procedure Laterality Date  . BACK SURGERY    . CARPAL TUNNEL RELEASE Right 10/13/2015   Procedure: RIGHT CARPAL TUNNEL RELEASE;  Surgeon: Daryll Brod, MD;  Location: [facility];  Service: Orthopedics;  Laterality: Right;  . CARPAL TUNNEL RELEASE Left 07/05/2016   Procedure: LEFT CARPAL TUNNEL RELEASE;  Surgeon: Daryll Brod, MD;  Location: [facility];  Service: Orthopedics;  Laterality: Left;  . HERNIA REPAIR    . INGUINAL HERNIA REPAIR  2003   right   . KNEE ARTHROSCOPY Right 03/03/2016  . LUMBAR DISC SURGERY  8/96   Dr. Coralyn Mark, discectomy  . SHOULDER SURGERY  11/28/05   left partial  . SHOULDER SURGERY  07/14/2006   RIGHT  . TONSILECTOMY, ADENOIDECTOMY, BILATERAL MYRINGOTOMY AND TUBES         Home Medications    Prior to Admission medications   Medication Sig Start Date End Date Taking? Authorizing Provider  aspirin 325 MG EC tablet Take 325 mg by mouth  daily.   Yes Historical Provider, MD  atorvastatin (LIPITOR) 40 MG tablet Take 1 tablet (40 mg total) by mouth daily. 10/26/15  Yes Chipper Herb, MD  b complex vitamins tablet Take 1 tablet by mouth daily.     Yes Historical Provider, MD  Calcium Carbonate (CALTRATE 600 PO) Take by mouth daily.     Yes Historical Provider, MD  Cholecalciferol (VITAMIN D3) 2000 UNITS capsule Take 2,000 Units by mouth daily.     Yes Historical Provider, MD  fexofenadine (ALLEGRA) 180 MG tablet Take 180 mg by mouth daily.   Yes Historical Provider, MD  Flaxseed, Linseed,  (FLAX SEED OIL PO) Take 1 capsule by mouth daily.   Yes Historical Provider, MD  furosemide (LASIX) 20 MG tablet Take 1.5 tablets (30 mg total) by mouth daily. 04/30/16  Yes Chipper Herb, MD  Glucosamine-Chondroit-Vit C-Mn (GLUCOSAMINE 1500 COMPLEX) CAPS Take by mouth daily.     Yes Historical Provider, MD  insulin glargine (LANTUS) 100 UNIT/ML injection Inject 0.75 mLs (75 Units total) into the skin 2 (two) times daily. As directed 10/26/15  Yes Chipper Herb, MD  insulin lispro (HUMALOG) 100 UNIT/ML injection Inject 0.2-0.5 mLs (20-50 Units total) into the skin 3 (three) times daily before meals. Sliding scale 10/26/15  Yes Chipper Herb, MD  montelukast (SINGULAIR) 10 MG tablet Take 1 tablet (10 mg total) by mouth at bedtime. 10/26/15  Yes Chipper Herb, MD  MULTIPLE VITAMIN PO Take 1 tablet by mouth daily.    Yes Historical Provider, MD  oseltamivir (TAMIFLU) 75 MG capsule Take 1 capsule (75 mg total) by mouth 2 (two) times daily. 08/06/16  Yes Eustaquio Maize, MD  pantoprazole (PROTONIX) 40 MG tablet Take 1 tablet (40 mg total) by mouth daily. 10/26/15  Yes Chipper Herb, MD  ranitidine (ZANTAC) 75 MG tablet Take 75 mg by mouth at bedtime.   Yes Historical Provider, MD  telmisartan (MICARDIS) 40 MG tablet Take 40 mg by mouth daily.   Yes Historical Provider, MD  Testosterone (AXIRON) 30 MG/ACT SOLN Place 1 application onto the skin daily. Place 1 application under each axilla daily 07/17/15  Yes Chipper Herb, MD  valsartan (DIOVAN) 160 MG tablet TAKE 1 TABLET DAILY Patient taking differently: TAKE 1 TABLET AT BEDTIME 11/05/15  Yes Chipper Herb, MD  B-D ULTRA-FINE 33 LANCETS MISC Check Blood sugar 8 times daily and PRN.DX E11.9 01/19/15   Chipper Herb, MD  EPIPEN 2-PAK 0.3 MG/0.3ML SOAJ injection Inject 0.3 mg into the skin once.  01/04/15   Historical Provider, MD  glucose blood (ONE TOUCH ULTRA TEST) test strip chesk BS q 2 hours PRN.dx.E11.9 01/19/15   Chipper Herb, MD    HYDROcodone-acetaminophen (NORCO) 5-325 MG tablet Take 1 tablet by mouth every 6 (six) hours as needed for moderate pain. 07/05/16   Daryll Brod, MD  Insulin Syringe-Needle U-100 (BD INSULIN SYRINGE ULTRAFINE) 31G X 15/64" 1 ML MISC Inject 1 Syringe into the skin 2 (two) times daily. Dx.E11.9 01/19/15   Chipper Herb, MD  Insulin Syringe-Needle U-100 (BD INSULIN SYRINGE ULTRAFINE) 31G X 5/16" 0.5 ML MISC USE TO INJECT 8 TO 10 TIMES DAILY, dx E11.9 01/19/15   Chipper Herb, MD  traMADol (ULTRAM) 50 MG tablet Take 1 tablet (50 mg total) by mouth every 6 (six) hours as needed. Patient taking differently: Take 50 mg by mouth every 6 (six) hours as needed for moderate pain.  07/17/15   Elenore Rota  Jennette Bill, MD    Family History Family History  Problem Relation Age of Onset  . Heart failure Mother   . Heart attack Mother     in her 16's  . Alzheimer's disease Mother   . Diabetes Mother   . Asthma Father   . Suicidality Father     in his 2's  . Allergic rhinitis Sister   . Heart failure Maternal Grandmother   . Rheum arthritis Maternal Grandmother   . Breast cancer Maternal Grandmother   . Heart attack Maternal Grandmother     in her 36's    Social History Social History  Substance Use Topics  . Smoking status: Former Smoker    Years: 34.00    Types: Cigarettes, Pipe    Quit date: 08/29/2001  . Smokeless tobacco: Never Used     Comment: quit 2005 smoked cigarettes for 5 yrs prior to pipe use  . Alcohol use No     Comment: once a year     Allergies   Erythromycin and Sulfonamide derivatives   Review of Systems Review of Systems   Physical Exam Updated Vital Signs BP 114/62   Pulse 80   Temp 98.3 F (36.8 C) (Oral)   Resp (!) 21   Ht 5' 10"  (1.778 m)   Wt 273 lb 5.9 oz (124 kg)   SpO2 100%   BMI 39.22 kg/m   Physical Exam  Constitutional: He is oriented to person, place, and time. He appears well-developed and well-nourished. No distress.  HENT:  Head: Normocephalic  and atraumatic.  Very dry mucous membranes  Eyes: EOM are normal. Pupils are equal, round, and reactive to light.  Neck: Normal range of motion. Neck supple.  No meningismus. No posterior midline cervical tenderness to palpation.  Cardiovascular: Normal rate and regular rhythm.  Exam reveals no gallop and no friction rub.   No murmur heard. Pulmonary/Chest: Effort normal. No respiratory distress. He has no wheezes. He has no rales. He exhibits no tenderness.  Mildly diminished breath sounds bilateral bases.  Abdominal: Soft. Bowel sounds are normal. There is no tenderness. There is no rebound and no guarding.  No abdominal tenderness to palpation. mildly distended.  Musculoskeletal: Normal range of motion. He exhibits no edema or tenderness.   No midline thoracic or lumbar tenderness. No lower extremity swelling, asymmetry. Patient does have some pain with range of motion of the right knee. No obvious swelling or injury. No warmth or redness.  Lymphadenopathy:    He has no cervical adenopathy.  Neurological: He is alert and oriented to person, place, and time.  5/5 motor in all extremities. Sensation is fully intact.  Skin: Skin is warm and dry. Capillary refill takes less than 2 seconds. No rash noted. No erythema.  Psychiatric: He has a normal mood and affect. His behavior is normal.  Nursing note and vitals reviewed.    ED Treatments / Results  Labs (all labs ordered are listed, but only abnormal results are displayed) Labs Reviewed  COMPREHENSIVE METABOLIC PANEL - Abnormal; Notable for the following:       Result Value   Glucose, Bld 121 (*)    BUN 24 (*)    Creatinine, Ser 1.38 (*)    Calcium 8.7 (*)    Total Bilirubin 1.4 (*)    GFR calc non Af Amer 52 (*)    GFR calc Af Amer 60 (*)    All other components within normal limits  CBC WITH DIFFERENTIAL/PLATELET - Abnormal;  Notable for the following:    WBC 14.8 (*)    Hemoglobin 17.2 (*)    MCV 104.9 (*)    MCH 35.0 (*)     Platelets 126 (*)    Neutro Abs 12.1 (*)    Monocytes Absolute 2.0 (*)    All other components within normal limits  I-STAT CG4 LACTIC ACID, ED - Abnormal; Notable for the following:    Lactic Acid, Venous 2.70 (*)    All other components within normal limits  CULTURE, BLOOD (ROUTINE X 2)  CULTURE, BLOOD (ROUTINE X 2)  URINE CULTURE  C DIFFICILE QUICK SCREEN W PCR REFLEX  GASTROINTESTINAL PANEL BY PCR, STOOL (REPLACES STOOL CULTURE)  MRSA PCR SCREENING  URINALYSIS, ROUTINE W REFLEX MICROSCOPIC  INFLUENZA PANEL BY PCR (TYPE A & B, H1N1)  LACTIC ACID, PLASMA  ROTAVIRUS ANTIGEN, STOOL  LACTIC ACID, PLASMA  CBC  COMPREHENSIVE METABOLIC PANEL    EKG  EKG Interpretation  Date/Time:  Saturday August 06 2016 13:08:48 EST Ventricular Rate:  92 PR Interval:    QRS Duration: 151 QT Interval:  366 QTC Calculation: 453 R Axis:   76 Text Interpretation:  Sinus rhythm Right bundle branch block Confirmed by Maela Takeda  MD, Juliah Scadden ([id number]) on 08/06/2016 1:40:11 PM       Radiology Dg Chest Port 1 View  Result Date: 08/06/2016 CLINICAL DATA:  Shortness of breath and fever EXAM: PORTABLE CHEST 1 VIEW COMPARISON:  08/06/2016 chest radiograph. FINDINGS: Partially visualized hemiarthroplasty in both shoulders. Stable cardiomediastinal silhouette with top-normal heart size. No pneumothorax. No pleural effusion. No pulmonary edema. No acute consolidative airspace disease. IMPRESSION: No active disease. Electronically Signed   By: Ilona Sorrel M.D.   On: 08/06/2016 17:08   Dg Chest Port 1 View  Result Date: 08/06/2016 CLINICAL DATA:  Patient with fever. EXAM: PORTABLE CHEST 1 VIEW COMPARISON:  Chest radiograph 01/18/2016. FINDINGS: Monitoring leads overlie the patient. Stable enlarged cardiac and mediastinal contours. Pulmonary vascular redistribution. No large area of pulmonary consolidation. No pleural effusion or pneumothorax. Old left rib fractures. IMPRESSION: Cardiomegaly.  Pulmonary  vascular redistribution. Electronically Signed   By: Lovey Newcomer M.D.   On: 08/06/2016 14:42    Procedures Procedures (including critical care time)  Medications Ordered in ED Medications  norepinephrine (LEVOPHED) 4 mg in dextrose 5 % 250 mL (0.016 mg/mL) infusion (5 mcg/min Intravenous Rate/Dose Change 08/06/16 1703)  irbesartan (AVAPRO) tablet 150 mg (not administered)  HYDROcodone-acetaminophen (NORCO/VICODIN) 5-325 MG per tablet 1 tablet (not administered)  atorvastatin (LIPITOR) tablet 40 mg (not administered)  insulin glargine (LANTUS) injection 35 Units (not administered)  montelukast (SINGULAIR) tablet 10 mg (not administered)  pantoprazole (PROTONIX) EC tablet 40 mg (not administered)  traMADol (ULTRAM) tablet 50 mg (not administered)  loratadine (CLARITIN) tablet 10 mg (not administered)  aspirin EC tablet 325 mg (not administered)  famotidine (PEPCID) tablet 20 mg (not administered)  acetaminophen (TYLENOL) tablet 650 mg (not administered)    Or  acetaminophen (TYLENOL) suppository 650 mg (not administered)  insulin aspart (novoLOG) injection 0-20 Units (not administered)  enoxaparin (LOVENOX) injection 40 mg (not administered)  0.9 % NaCl with KCl 40 mEq / L  infusion (not administered)  ondansetron (ZOFRAN) tablet 4 mg (not administered)    Or  ondansetron (ZOFRAN) injection 4 mg (not administered)  vancomycin (VANCOCIN) 1,250 mg in sodium chloride 0.9 % 250 mL IVPB (not administered)  piperacillin-tazobactam (ZOSYN) IVPB 3.375 g (not administered)  sodium chloride 0.9 % bolus 1,000 mL (0 mLs Intravenous  Stopped 08/06/16 1532)  piperacillin-tazobactam (ZOSYN) IVPB 3.375 g (0 g Intravenous Stopped 08/06/16 1532)  vancomycin (VANCOCIN) IVPB 1000 mg/200 mL premix (0 mg Intravenous Stopped 08/06/16 1634)  sodium chloride 0.9 % bolus 2,500 mL (0 mLs Intravenous Stopped 08/06/16 1803)  acetaminophen (TYLENOL) tablet 650 mg (650 mg Oral Given 08/06/16 1538)     Initial  Impression / Assessment and Plan / ED Course  I have reviewed the triage vital signs and the nursing notes.  Pertinent labs & imaging results that were available during my care of the patient were reviewed by me and considered in my medical decision making (see chart for details).  Clinical Course   Concern for possible sepsis. Normal vital signs at presentation. Mild elevation of lactic acid. Given broad-spectrum antibiotics. Patient became mildly hypotensive and 30 mL per KG IV fluid bolus was initiated.  Sepsis - Repeat Assessment  Performed at:    1610  Vitals     Blood pressure (!) 81/49, pulse 94, temperature 98.9 F (37.2 C), temperature source Oral, resp. rate 24, height 5' 10"  (1.778 m), weight 267 lb (121.1 kg), SpO2 97 %.  Heart:     Regular rate and rhythm  Lungs:    CTA  Capillary Refill:   <2 sec  Peripheral Pulse:   Radial pulse palpable  Skin:     Normal Color  Patient with persistent hypotension despite new completion of 30 mL per KG bolus. Patient is still mentating well. He's made no urine at this point. Will start Levophed and will need ICU admission.  CRITICAL CARE Performed by: Lita Mains, Vernie Vinciguerra Total critical care time: 50 minutes Critical care time was exclusive of separately billable procedures and treating other patients. Critical care was necessary to treat or prevent imminent or life-threatening deterioration. Critical care was time spent personally by me on the following activities: development of treatment plan with patient and/or surrogate as well as nursing, discussions with consultants, evaluation of patient's response to treatment, examination of patient, obtaining history from patient or surrogate, ordering and performing treatments and interventions, ordering and review of laboratory studies, ordering and review of radiographic studies, pulse oximetry and re-evaluation of patient's condition.  Final Clinical Impressions(s) / ED Diagnoses   Final  diagnoses:  Septic shock [facility])    New Prescriptions Current Discharge Medication List       Julianne Rice, MD 08/06/16 2100

## 2016-08-06 NOTE — ED Notes (Signed)
Pt incontinent of stool upon arrival.  Watery brown .  Cleaned pt up.

## 2016-08-06 NOTE — Progress Notes (Signed)
Pharmacy Antibiotic Note  Tony Cantu is a 65 y.o. male admitted on 08/06/2016 with sepsis.  Pharmacy has been consulted for vancomycin and zosyn dosing.Initial doses given in the ED  Plan: Cont vanc 1250 mg IV q12 hours Cont zosyn 3.375 gm IV q8 hours F/u renal function, cultures and clinical course  Height: 5' 10"  (177.8 cm) Weight: 273 lb 5.9 oz (124 kg) IBW/kg (Calculated) : 73  Temp (24hrs), Avg:98.9 F (37.2 C), Min:98.3 F (36.8 C), Max:100.3 F (37.9 C)   Recent Labs Lab 08/06/16 1406 08/06/16 1417 08/06/16 1645  WBC 14.8*  --   --   CREATININE 1.38*  --   --   LATICACIDVEN  --  2.70* 1.7    Estimated Creatinine Clearance: 69.6 mL/min (by C-G formula based on SCr of 1.38 mg/dL (H)).    Allergies  Allergen Reactions  . Erythromycin Rash  . Sulfonamide Derivatives Rash    Antimicrobials this admission: vanc 12/9 >>  zosyn 12/9 >>    Thank you for allowing pharmacy to be a part of this patient's care.  Beverlee Nims 08/06/2016 6:56 PM

## 2016-08-06 NOTE — H&P (Addendum)
History and Physical  Tony Cantu Cantu:[id number] DOB: Oct 19, 1950 DOA: 08/06/2016  Referring physician: Dr Lita Mains, ED physician PCP: Redge Gainer, MD  Outpatient Specialists:   Dr Servando Snare and [facility] - Cardiology  Dr Fredna Dow - Hand surgery  Chief Complaint: Syncope, Fever  HPI: Tony Cantu is a 65 y.o. male with a history of insulin-dependent diabetes, GERD, hypertension, sleep apnea with C Pap use, BPH, arthritis, asthma. Patient presents with approximately 36 hours of not feeling well with increasing body temperature and chills. Patient was having chills last night and then woke up at 3 AM this morning with fever of 104. He had several doses of Tylenol, ibuprofen, aspirin and attempts to reduce his fever. Additionally, he had cold sponge bath. His fever decreased a little. Additionally, he had 3 episodes of nonbloody diarrhea. When the patient got up from the bathroom, he passed out - she caught him and gradually lowered him to the floor without any head or neck trauma. There was no seizure activity. Every time he sat up, the patient became syncopal. EMS was called and the patient was found to have an initial blood pressure of 80. He had a CBG of 141. He was given him in 100 mL's of IV fluid in route. Prior to coming, the patient did take 40 mg of Lasix at home.  The patient denies cough, wheezing, shortness of breath. He does complain of generalized myalgias and arthralgias. He had called his PCP this morning was prescribed Tamiflu.  Emergency Department Course: Code sepsis called at the time of patient's arrival. The patient received 30 mL/kg of IV fluids. Patient continued to be hypotensive and levophed was started. The patient received broad-spectrum antibiotics.  Review of Systems:   Pt denies any nausea, vomiting, diarrhea, constipation, abdominal pain, shortness of breath, dyspnea on exertion, orthopnea, cough, wheezing, palpitations, headache, vision changes, lightheadedness,  dizziness, melena, rectal bleeding.  Review of systems are otherwise negative  Past Medical History:  Diagnosis Date  . Arthritis   . Asthma   . BPH (benign prostatic hypertrophy)   . Colon polyps   . Depression    no meds  . Gastritis   . GERD (gastroesophageal reflux disease)   . Hypertension   . IDDM (insulin dependent diabetes mellitus) ([place])   . Sleep apnea    uses CPAP nightly  . Testicle trouble    both    Past Surgical History:  Procedure Laterality Date  . BACK SURGERY    . CARPAL TUNNEL RELEASE Right 10/13/2015   Procedure: RIGHT CARPAL TUNNEL RELEASE;  Surgeon: Daryll Brod, MD;  Location: [place];  Service: Orthopedics;  Laterality: Right;  . CARPAL TUNNEL RELEASE Left 07/05/2016   Procedure: LEFT CARPAL TUNNEL RELEASE;  Surgeon: Daryll Brod, MD;  Location: [place];  Service: Orthopedics;  Laterality: Left;  . HERNIA REPAIR    . INGUINAL HERNIA REPAIR  2003   right   . KNEE ARTHROSCOPY Right 03/03/2016  . LUMBAR DISC SURGERY  8/96   Dr. Coralyn Mark, discectomy  . SHOULDER SURGERY  11/28/05   left partial  . SHOULDER SURGERY  07/14/2006   RIGHT  . TONSILECTOMY, ADENOIDECTOMY, BILATERAL MYRINGOTOMY AND TUBES     Social History:  reports that he quit smoking about 14 years ago. His smoking use included Cigarettes and Pipe. He quit after 34.00 years of use. He has never used smokeless tobacco. He reports that he drinks alcohol. He reports that he does not use drugs. Patient  lives at home  Allergies  Allergen Reactions  . Erythromycin Rash  . Sulfonamide Derivatives Rash    Family History  Problem Relation Age of Onset  . Heart failure Mother   . Heart attack Mother     in her 32's  . Asthma Father   . Suicidality Father     in his 79's  . Allergic rhinitis Sister   . Heart failure Maternal Grandmother   . Rheum arthritis Maternal Grandmother   . Breast cancer Maternal Grandmother   . Heart attack Maternal Grandmother      in her 22's      Prior to Admission medications   Medication Sig Start Date End Date Taking? Authorizing Provider  aspirin 325 MG EC tablet Take 325 mg by mouth daily.   Yes Historical Provider, MD  atorvastatin (LIPITOR) 40 MG tablet Take 1 tablet (40 mg total) by mouth daily. 10/26/15  Yes Chipper Herb, MD  b complex vitamins tablet Take 1 tablet by mouth daily.     Yes Historical Provider, MD  Calcium Carbonate (CALTRATE 600 PO) Take by mouth daily.     Yes Historical Provider, MD  Cholecalciferol (VITAMIN D3) 2000 UNITS capsule Take 2,000 Units by mouth daily.     Yes Historical Provider, MD  fexofenadine (ALLEGRA) 180 MG tablet Take 180 mg by mouth daily.   Yes Historical Provider, MD  Flaxseed, Linseed, (FLAX SEED OIL PO) Take 1 capsule by mouth daily.   Yes Historical Provider, MD  furosemide (LASIX) 20 MG tablet Take 1.5 tablets (30 mg total) by mouth daily. 04/30/16  Yes Chipper Herb, MD  Glucosamine-Chondroit-Vit C-Mn (GLUCOSAMINE 1500 COMPLEX) CAPS Take by mouth daily.     Yes Historical Provider, MD  insulin glargine (LANTUS) 100 UNIT/ML injection Inject 0.75 mLs (75 Units total) into the skin 2 (two) times daily. As directed 10/26/15  Yes Chipper Herb, MD  insulin lispro (HUMALOG) 100 UNIT/ML injection Inject 0.2-0.5 mLs (20-50 Units total) into the skin 3 (three) times daily before meals. Sliding scale 10/26/15  Yes Chipper Herb, MD  montelukast (SINGULAIR) 10 MG tablet Take 1 tablet (10 mg total) by mouth at bedtime. 10/26/15  Yes Chipper Herb, MD  MULTIPLE VITAMIN PO Take 1 tablet by mouth daily.    Yes Historical Provider, MD  oseltamivir (TAMIFLU) 75 MG capsule Take 1 capsule (75 mg total) by mouth 2 (two) times daily. 08/06/16  Yes Eustaquio Maize, MD  pantoprazole (PROTONIX) 40 MG tablet Take 1 tablet (40 mg total) by mouth daily. 10/26/15  Yes Chipper Herb, MD  ranitidine (ZANTAC) 75 MG tablet Take 75 mg by mouth at bedtime.   Yes Historical Provider, MD  telmisartan  (MICARDIS) 40 MG tablet Take 40 mg by mouth daily.   Yes Historical Provider, MD  Testosterone (AXIRON) 30 MG/ACT SOLN Place 1 application onto the skin daily. Place 1 application under each axilla daily 07/17/15  Yes Chipper Herb, MD  valsartan (DIOVAN) 160 MG tablet TAKE 1 TABLET DAILY Patient taking differently: TAKE 1 TABLET AT BEDTIME 11/05/15  Yes Chipper Herb, MD  B-D ULTRA-FINE 33 LANCETS MISC Check Blood sugar 8 times daily and PRN.DX E11.9 01/19/15   Chipper Herb, MD  EPIPEN 2-PAK 0.3 MG/0.3ML SOAJ injection Inject 0.3 mg into the skin once.  01/04/15   Historical Provider, MD  glucose blood (ONE TOUCH ULTRA TEST) test strip chesk BS q 2 hours PRN.dx.E11.9 01/19/15   Chipper Herb,  MD  HYDROcodone-acetaminophen (NORCO) 5-325 MG tablet Take 1 tablet by mouth every 6 (six) hours as needed for moderate pain. 07/05/16   Daryll Brod, MD  Insulin Syringe-Needle U-100 (BD INSULIN SYRINGE ULTRAFINE) 31G X 15/64" 1 ML MISC Inject 1 Syringe into the skin 2 (two) times daily. Dx.E11.9 01/19/15   Chipper Herb, MD  Insulin Syringe-Needle U-100 (BD INSULIN SYRINGE ULTRAFINE) 31G X 5/16" 0.5 ML MISC USE TO INJECT 8 TO 10 TIMES DAILY, dx E11.9 01/19/15   Chipper Herb, MD  traMADol (ULTRAM) 50 MG tablet Take 1 tablet (50 mg total) by mouth every 6 (six) hours as needed. Patient taking differently: Take 50 mg by mouth every 6 (six) hours as needed for moderate pain.  07/17/15   Chipper Herb, MD    Physical Exam: BP (!) 88/60   Pulse 80   Temp 98.9 F (37.2 C) (Oral)   Resp 23   Ht 5' 10"  (1.778 m)   Wt 121.1 kg (267 lb)   SpO2 95%   BMI 38.31 kg/m   General: Older Caucasian male. Awake and alert and oriented x3. No acute cardiopulmonary distress.  HEENT: Normocephalic atraumatic.  Right and left ears normal in appearance.  Pupils equal, round, reactive to light. Extraocular muscles are intact. Sclerae anicteric and noninjected.  Moist mucosal membranes. No mucosal lesions.  Neck: Neck supple  without lymphadenopathy. No carotid bruits. No masses palpated.  Cardiovascular: Regular rate with normal S1-S2 sounds. No murmurs, rubs, gallops auscultated. No JVD.  Respiratory: Good respiratory effort with no wheezes, rales, rhonchi. Lungs clear to auscultation bilaterally.  No accessory muscle use. Abdomen: Obese. Soft, nontender, nondistended. Active bowel sounds.  Skin: No rashes, lesions, or ulcerations.  Dry, warm to touch. 2+ dorsalis pedis and radial pulses. Cap refill within 2 secs. Musculoskeletal: No calf or leg pain. All major joints not erythematous nontender.  No upper or lower joint deformation.  Good ROM.  No contractures  Psychiatric: Intact judgment and insight. Pleasant and cooperative. Neurologic: No focal neurological deficits. Strength is 5/5 and symmetric in upper and lower extremities.  Cranial nerves II through XII are grossly intact.           Labs on Admission: I have personally reviewed following labs and imaging studies  CBC:  Recent Labs Lab 08/06/16 1406  WBC 14.8*  NEUTROABS 12.1*  HGB 17.2*  HCT 51.6  MCV 104.9*  PLT 188*   Basic Metabolic Panel:  Recent Labs Lab 08/06/16 1406  NA 138  K 3.8  CL 104  CO2 23  GLUCOSE 121*  BUN 24*  CREATININE 1.38*  CALCIUM 8.7*   GFR: Estimated Creatinine Clearance: 68.7 mL/min (by C-G formula based on SCr of 1.38 mg/dL (H)). Liver Function Tests:  Recent Labs Lab 08/06/16 1406  AST 32  ALT 28  ALKPHOS 68  BILITOT 1.4*  PROT 7.2  ALBUMIN 3.7   No results for input(s): LIPASE, AMYLASE in the last 168 hours. No results for input(s): AMMONIA in the last 168 hours. Coagulation Profile: No results for input(s): INR, PROTIME in the last 168 hours. Cardiac Enzymes: No results for input(s): CKTOTAL, CKMB, CKMBINDEX, TROPONINI in the last 168 hours. BNP (last 3 results) No results for input(s): PROBNP in the last 8760 hours. HbA1C: No results for input(s): HGBA1C in the last 72 hours. CBG: No  results for input(s): GLUCAP in the last 168 hours. Lipid Profile: No results for input(s): CHOL, HDL, LDLCALC, TRIG, CHOLHDL, LDLDIRECT in the last  72 hours. Thyroid Function Tests: No results for input(s): TSH, T4TOTAL, FREET4, T3FREE, THYROIDAB in the last 72 hours. Anemia Panel: No results for input(s): VITAMINB12, FOLATE, FERRITIN, TIBC, IRON, RETICCTPCT in the last 72 hours. Urine analysis:    Component Value Date/Time   COLORURINE YELLOW 08/06/2016 1324   APPEARANCEUR CLEAR 08/06/2016 1324   APPEARANCEUR Clear 06/16/2016 0928   LABSPEC 1.013 08/06/2016 1324   PHURINE 5.0 08/06/2016 1324   GLUCOSEU NEGATIVE 08/06/2016 1324   HGBUR NEGATIVE 08/06/2016 1324   BILIRUBINUR NEGATIVE 08/06/2016 1324   BILIRUBINUR Negative 06/16/2016 0928   KETONESUR NEGATIVE 08/06/2016 1324   PROTEINUR NEGATIVE 08/06/2016 1324   UROBILINOGEN negative 08/25/2015 0859   NITRITE NEGATIVE 08/06/2016 1324   LEUKOCYTESUR NEGATIVE 08/06/2016 1324   LEUKOCYTESUR Negative 06/16/2016 0928   Sepsis Labs: @LABRCNTIP (procalcitonin:4,lacticidven:4) ) Recent Results (from the past 240 hour(s))  Blood Culture (routine x 2)     Status: None (Preliminary result)   Collection Time: 08/06/16  2:06 PM  Result Value Ref Range Status   Specimen Description BLOOD RIGHT HAND  Final   Special Requests BOTTLES DRAWN AEROBIC AND ANAEROBIC 8CC EACH  Final   Culture PENDING  Incomplete   Report Status PENDING  Incomplete  Blood Culture (routine x 2)     Status: None (Preliminary result)   Collection Time: 08/06/16  2:07 PM  Result Value Ref Range Status   Specimen Description BLOOD RIGHT FOREARM  Final   Special Requests BOTTLES DRAWN AEROBIC AND ANAEROBIC [facility] EACH  Final   Culture PENDING  Incomplete   Report Status PENDING  Incomplete     Radiological Exams on Admission: Dg Chest Port 1 View  Result Date: 08/06/2016 CLINICAL DATA:  Shortness of breath and fever EXAM: PORTABLE CHEST 1 VIEW COMPARISON:  08/06/2016  chest radiograph. FINDINGS: Partially visualized hemiarthroplasty in both shoulders. Stable cardiomediastinal silhouette with top-normal heart size. No pneumothorax. No pleural effusion. No pulmonary edema. No acute consolidative airspace disease. IMPRESSION: No active disease. Electronically Signed   By: Ilona Sorrel M.D.   On: 08/06/2016 17:08   Dg Chest Port 1 View  Result Date: 08/06/2016 CLINICAL DATA:  Patient with fever. EXAM: PORTABLE CHEST 1 VIEW COMPARISON:  Chest radiograph 01/18/2016. FINDINGS: Monitoring leads overlie the patient. Stable enlarged cardiac and mediastinal contours. Pulmonary vascular redistribution. No large area of pulmonary consolidation. No pleural effusion or pneumothorax. Old left rib fractures. IMPRESSION: Cardiomegaly.  Pulmonary vascular redistribution. Electronically Signed   By: Lovey Newcomer M.D.   On: 08/06/2016 14:42    EKG: Independently reviewed. Sinus rhythm with right bundle branch block  Assessment/Plan: Principal Problem:   Septic shock (HCC) Active Problems:   Diabetes type 2, controlled ([place])   Essential hypertension, benign   Acute renal injury ([place])   Diarrhea   Fever   Hyperbilirubinemia   Lactic acidosis    This patient was discussed with the ED physician, including pertinent vitals, physical exam findings, labs, and imaging.  We also discussed care given by the ED provider.  #1 Septic Shock  Admit to ICU  Unknown source  Fluid resuscitation occurred in ED  Patient started on Levophed  If continues to be hypotensive, will start steroids  Continue IV fluids  Recheck CBC in the morning #2 Fever  Chest x-ray negative, urine dip is normal, flu by PCR is negative, abdomen nontender on exam.  Cultures obtained  Continue broad-spectrum antibiotics #3 Hyperbilirubinemia  Slightly elevated  Other LFTs are normal  Recheck bilirubin in the morning #4  ARI  Technique to sepsis  Recheck creatinine tomorrow #5 DM2 -  IDDM  Check CBGs every 6 hours  Patient has anorexia - has an unsure of how much the patient will eat, will have to patient's long-acting insulin and place the patient on sliding scale every 6 hours #6 HTN  Hold antihypertensives for now #7 Lactic Acidosis  Resolved after fluid hydration #8 diarrhea  Enteric precautions  We'll send stool for pathogens analysis  C. difficile pending  DVT prophylaxis: Lovenox Consultants: None Code Status: Full code Family Communication: Wife in the room  Disposition Plan: Pending   Truett Mainland, DO [facility] Pager [phone]  If 7PM-7AM, please contact night-coverage [email or web address] Password [email or web address]

## 2016-08-07 ENCOUNTER — Inpatient Hospital Stay (HOSPITAL_COMMUNITY): Payer: Medicare Other

## 2016-08-07 LAB — CBC
HCT: 41.8 % (ref 39.0–52.0)
Hemoglobin: 13.9 g/dL (ref 13.0–17.0)
MCH: 34.8 pg — AB (ref 26.0–34.0)
MCHC: 33.3 g/dL (ref 30.0–36.0)
MCV: 104.8 fL — ABNORMAL HIGH (ref 78.0–100.0)
PLATELETS: 138 10*3/uL — AB (ref 150–400)
RBC: 3.99 MIL/uL — AB (ref 4.22–5.81)
RDW: 13.6 % (ref 11.5–15.5)
WBC: 15 10*3/uL — AB (ref 4.0–10.5)

## 2016-08-07 LAB — COMPREHENSIVE METABOLIC PANEL
ALT: 25 U/L (ref 17–63)
ANION GAP: 4 — AB (ref 5–15)
AST: 35 U/L (ref 15–41)
Albumin: 2.7 g/dL — ABNORMAL LOW (ref 3.5–5.0)
Alkaline Phosphatase: 45 U/L (ref 38–126)
BUN: 14 mg/dL (ref 6–20)
CHLORIDE: 112 mmol/L — AB (ref 101–111)
CO2: 22 mmol/L (ref 22–32)
CREATININE: 0.97 mg/dL (ref 0.61–1.24)
Calcium: 7.4 mg/dL — ABNORMAL LOW (ref 8.9–10.3)
Glucose, Bld: 154 mg/dL — ABNORMAL HIGH (ref 65–99)
POTASSIUM: 3.9 mmol/L (ref 3.5–5.1)
Sodium: 138 mmol/L (ref 135–145)
Total Bilirubin: 1 mg/dL (ref 0.3–1.2)
Total Protein: 5.6 g/dL — ABNORMAL LOW (ref 6.5–8.1)

## 2016-08-07 LAB — GLUCOSE, CAPILLARY
GLUCOSE-CAPILLARY: 168 mg/dL — AB (ref 65–99)
GLUCOSE-CAPILLARY: 197 mg/dL — AB (ref 65–99)
GLUCOSE-CAPILLARY: 299 mg/dL — AB (ref 65–99)
Glucose-Capillary: 155 mg/dL — ABNORMAL HIGH (ref 65–99)
Glucose-Capillary: 203 mg/dL — ABNORMAL HIGH (ref 65–99)

## 2016-08-07 LAB — LACTIC ACID, PLASMA: LACTIC ACID, VENOUS: 1.5 mmol/L (ref 0.5–1.9)

## 2016-08-07 LAB — MRSA PCR SCREENING: MRSA BY PCR: NEGATIVE

## 2016-08-07 MED ORDER — SODIUM CHLORIDE 0.9 % IV SOLN
INTRAVENOUS | Status: DC
  Administered 2016-08-08: 01:00:00 via INTRAVENOUS

## 2016-08-07 MED ORDER — DILTIAZEM LOAD VIA INFUSION
10.0000 mg | Freq: Once | INTRAVENOUS | Status: AC
  Administered 2016-08-07: 10 mg via INTRAVENOUS
  Filled 2016-08-07: qty 10

## 2016-08-07 MED ORDER — ALBUTEROL SULFATE (2.5 MG/3ML) 0.083% IN NEBU
5.0000 mg | INHALATION_SOLUTION | Freq: Once | RESPIRATORY_TRACT | Status: AC
  Administered 2016-08-07: 5 mg via RESPIRATORY_TRACT

## 2016-08-07 MED ORDER — HYDROCORTISONE NA SUCCINATE PF 100 MG IJ SOLR
50.0000 mg | Freq: Two times a day (BID) | INTRAMUSCULAR | Status: DC
  Administered 2016-08-08: 50 mg via INTRAVENOUS
  Filled 2016-08-07: qty 2

## 2016-08-07 MED ORDER — HYDROCORTISONE NA SUCCINATE PF 100 MG IJ SOLR
50.0000 mg | Freq: Three times a day (TID) | INTRAMUSCULAR | Status: AC
  Administered 2016-08-07 (×3): 50 mg via INTRAVENOUS
  Filled 2016-08-07 (×3): qty 2

## 2016-08-07 MED ORDER — ALBUTEROL SULFATE (2.5 MG/3ML) 0.083% IN NEBU
INHALATION_SOLUTION | RESPIRATORY_TRACT | Status: AC
  Filled 2016-08-07: qty 6

## 2016-08-07 MED ORDER — PHENOL 1.4 % MT LIQD
1.0000 | OROMUCOSAL | Status: DC | PRN
  Administered 2016-08-08: 1 via OROMUCOSAL
  Filled 2016-08-07: qty 177

## 2016-08-07 MED ORDER — VANCOMYCIN 50 MG/ML ORAL SOLUTION
ORAL | Status: AC
  Filled 2016-08-07: qty 5

## 2016-08-07 MED ORDER — DILTIAZEM HCL 100 MG IV SOLR
5.0000 mg/h | INTRAVENOUS | Status: DC
  Administered 2016-08-07: 5 mg/h via INTRAVENOUS
  Administered 2016-08-07: 15 mg/h via INTRAVENOUS
  Filled 2016-08-07 (×2): qty 100

## 2016-08-07 MED ORDER — DEXTROSE 5 % IV SOLN
INTRAVENOUS | Status: AC
  Filled 2016-08-07: qty 100

## 2016-08-07 MED ORDER — LACTATED RINGERS IV SOLN
INTRAVENOUS | Status: DC
  Administered 2016-08-07: 13:00:00 via INTRAVENOUS

## 2016-08-07 MED ORDER — IPRATROPIUM BROMIDE 0.02 % IN SOLN
0.5000 mg | Freq: Once | RESPIRATORY_TRACT | Status: AC
  Administered 2016-08-07: 0.5 mg via RESPIRATORY_TRACT

## 2016-08-07 MED ORDER — VANCOMYCIN 50 MG/ML ORAL SOLUTION
125.0000 mg | Freq: Four times a day (QID) | ORAL | Status: DC
  Administered 2016-08-07 – 2016-08-08 (×5): 125 mg via ORAL
  Filled 2016-08-07 (×14): qty 2.5

## 2016-08-07 MED ORDER — IPRATROPIUM BROMIDE 0.02 % IN SOLN
RESPIRATORY_TRACT | Status: AC
  Filled 2016-08-07: qty 2.5

## 2016-08-07 MED ORDER — LORAZEPAM 0.5 MG PO TABS
0.5000 mg | ORAL_TABLET | ORAL | Status: DC | PRN
  Administered 2016-08-07 – 2016-08-08 (×2): 0.5 mg via ORAL
  Filled 2016-08-07 (×2): qty 1

## 2016-08-07 MED ORDER — SODIUM CHLORIDE 0.9 % IV BOLUS (SEPSIS)
1000.0000 mL | Freq: Once | INTRAVENOUS | Status: AC
  Administered 2016-08-07: 1000 mL via INTRAVENOUS

## 2016-08-07 NOTE — H&P (Signed)
CTSP re tachy. 65 yo with sepsis, s/p IVF and pressor, with hx of sinus brady yesterday, Now BP is 120, went into asymptomatic Afib with RVR at 160. K is OK. Will give Diltiazem 90m IV Bolus, and drip. Hold off on full anticoagulation at this time, as it may be transient in the setting of sepsis and recent use of pressor. Check TSH.   POrvan FalconerMD FACP. Hospitalist.

## 2016-08-07 NOTE — Progress Notes (Signed)
PROGRESS NOTE  RUSHTON EARLY TLX:[id number] DOB: 1951/04/28 DOA: 08/06/2016 PCP: Redge Gainer, MD   LOS: 1 day   Brief Narrative: Tony Cantu is a 65 y.o. male with a history of insulin-dependent diabetes, GERD, hypertension, sleep apnea with C Pap use, BPH, arthritis, asthma. Patient presents with approximately 36 hours of not feeling well with increasing body temperature and chills. Patient was having chills last night and then woke up at 3 AM this morning with fever of 104. He had several doses of Tylenol, ibuprofen, aspirin and attempts to reduce his fever. Additionally, he had cold sponge bath. His fever decreased a little. Additionally, he had 3 episodes of nonbloody diarrhea. When the patient got up from the bathroom, he passed out - she caught him and gradually lowered him to the floor without any head or neck trauma. There was no seizure activity. Every time he sat up, the patient became syncopal. EMS was called and the patient was found to have an initial blood pressure of 80. He had a CBG of 141. He was given him in 100 mL's of IV fluid in route. Prior to coming, the patient did take 40 mg of Lasix at home.  Assessment & Plan: Principal Problem:   Septic shock ([place]) Active Problems:   Diabetes type 2, controlled ([place])   Essential hypertension, benign   Acute renal injury ([place])   Diarrhea   Fever   Hyperbilirubinemia   Lactic acidosis   Septic shock - Patient did not initially respond to IV fluid in the emergency room, requiring ICU admission as well as Levophed infusion. She was also placed on stress dose steroids on admission. - continue broad-spectrum antibiotics with vancomycin and Zosyn, chest x-ray is clear, urinalysis is unremarkable. - Patient did have large amounts of watery diarrhea at home, he tested positive for C. difficile antigen however his toxin came back negative, thus testing was indeterminate. Given leukocytosis, watery diarrhea at home, and septic shock, I  feel like this warrants treatment, continue by mouth vancomycin as well in addition to systemic broad-spectrum antibiotics. - C. difficile PCR as well as GI pathogen panel are pending - He is off of his vasopressors this morning, wean off steroids starting tomorrow - Continue LR for today, most recent 2-D echo was done in September 2017 and showed normal ejection fraction 60-65% and indeterminate grade diastolic dysfunction  Diabetes mellitus - Hemoglobin A1c was 5.2 most recently, showing excellent control. - Continue Lantus as well as sliding scale, keep a close eye on the CBGs since patient is on steroids  Hypertension - Hold home antihypertensives, including Lasix and ARB  Hyperlipidemia - Continue atorvastatin  Obstructive sleep apnea - Continue nightly CPAP   DVT prophylaxis: Lovenox  Code Status: Full code  Family Communication: No family at bedside  Disposition Plan: Remain ICU today  Consultants:   None  Procedures:   None   Antimicrobials:  Vancomycin IV 12/9 >>  Zosyn 12/9 >>  Vancomycin by mouth 12/9 >>   Subjective: - She continues to feel weak this morning, however appreciates slight improvement since yesterday. Denies any chest pain or shortness of breath. He has no abdominal pain, denies any nausea or vomiting.  Objective: Vitals:   08/07/16 0400 08/07/16 0500 08/07/16 0753 08/07/16 0800  BP:    (!) 113/54  Pulse:    75  Resp:      Temp: 97.8 F (36.6 C)  98.1 F (36.7 C)   TempSrc: Oral  Oral  SpO2:    94%  Weight:  124.3 kg (274 lb 0.5 oz)    Height:        Intake/Output Summary (Last 24 hours) at 08/07/16 0938 Last data filed at 08/07/16 0800  Gross per 24 hour  Intake           422.41 ml  Output             2100 ml  Net         -1677.59 ml   Filed Weights   08/06/16 1841 08/06/16 1844 08/07/16 0500  Weight: 124 kg (273 lb 5.9 oz) 124 kg (273 lb 5.9 oz) 124.3 kg (274 lb 0.5 oz)    Examination: Constitutional: NAD Vitals:    08/07/16 0400 08/07/16 0500 08/07/16 0753 08/07/16 0800  BP:    (!) 113/54  Pulse:    75  Resp:      Temp: 97.8 F (36.6 C)  98.1 F (36.7 C)   TempSrc: Oral  Oral   SpO2:    94%  Weight:  124.3 kg (274 lb 0.5 oz)    Height:       Eyes: PERRL, lids and conjunctivae normal ENMT: Mucous membranes are dry Respiratory: clear to auscultation bilaterally, no wheezing, no crackles.  Cardiovascular: Regular rate and rhythm, no murmurs / rubs / gallops. No LE edema.  Abdomen: no tenderness. Bowel sounds positive.  Skin: no rashes, lesions, ulcers. No induration Neurologic: CN 2-12 grossly intact. Strength 5/5 in all 4.    Data Reviewed: I have personally reviewed following labs and imaging studies  CBC:  Recent Labs Lab 08/06/16 1406 08/07/16 0448  WBC 14.8* 15.0*  NEUTROABS 12.1*  --   HGB 17.2* 13.9  HCT 51.6 41.8  MCV 104.9* 104.8*  PLT 126* 681*   Basic Metabolic Panel:  Recent Labs Lab 08/06/16 1406 08/07/16 0448  NA 138 138  K 3.8 3.9  CL 104 112*  CO2 23 22  GLUCOSE 121* 154*  BUN 24* 14  CREATININE 1.38* 0.97  CALCIUM 8.7* 7.4*   GFR: Estimated Creatinine Clearance: 99.1 mL/min (by C-G formula based on SCr of 0.97 mg/dL). Liver Function Tests:  Recent Labs Lab 08/06/16 1406 08/07/16 0448  AST 32 35  ALT 28 25  ALKPHOS 68 45  BILITOT 1.4* 1.0  PROT 7.2 5.6*  ALBUMIN 3.7 2.7*   No results for input(s): LIPASE, AMYLASE in the last 168 hours. No results for input(s): AMMONIA in the last 168 hours. Coagulation Profile: No results for input(s): INR, PROTIME in the last 168 hours. Cardiac Enzymes: No results for input(s): CKTOTAL, CKMB, CKMBINDEX, TROPONINI in the last 168 hours. BNP (last 3 results) No results for input(s): PROBNP in the last 8760 hours. HbA1C: No results for input(s): HGBA1C in the last 72 hours. CBG:  Recent Labs Lab 08/06/16 2101 08/06/16 2352 08/07/16 0356 08/07/16 0752  GLUCAP 152* 172* 168* 155*   Lipid Profile: No  results for input(s): CHOL, HDL, LDLCALC, TRIG, CHOLHDL, LDLDIRECT in the last 72 hours. Thyroid Function Tests: No results for input(s): TSH, T4TOTAL, FREET4, T3FREE, THYROIDAB in the last 72 hours. Anemia Panel: No results for input(s): VITAMINB12, FOLATE, FERRITIN, TIBC, IRON, RETICCTPCT in the last 72 hours. Urine analysis:    Component Value Date/Time   COLORURINE YELLOW 08/06/2016 1324   APPEARANCEUR CLEAR 08/06/2016 1324   APPEARANCEUR Clear 06/16/2016 0928   LABSPEC 1.013 08/06/2016 1324   PHURINE 5.0 08/06/2016 1324   GLUCOSEU NEGATIVE 08/06/2016 1324  HGBUR NEGATIVE 08/06/2016 1324   BILIRUBINUR NEGATIVE 08/06/2016 1324   BILIRUBINUR Negative 06/16/2016 0928   KETONESUR NEGATIVE 08/06/2016 1324   PROTEINUR NEGATIVE 08/06/2016 1324   UROBILINOGEN negative 08/25/2015 0859   NITRITE NEGATIVE 08/06/2016 1324   LEUKOCYTESUR NEGATIVE 08/06/2016 1324   LEUKOCYTESUR Negative 06/16/2016 0928   Sepsis Labs: Invalid input(s): PROCALCITONIN, LACTICIDVEN  Recent Results (from the past 240 hour(s))  Blood Culture (routine x 2)     Status: None (Preliminary result)   Collection Time: 08/06/16  2:06 PM  Result Value Ref Range Status   Specimen Description BLOOD RIGHT HAND  Final   Special Requests BOTTLES DRAWN AEROBIC AND ANAEROBIC 8CC EACH  Final   Culture PENDING  Incomplete   Report Status PENDING  Incomplete  Blood Culture (routine x 2)     Status: None (Preliminary result)   Collection Time: 08/06/16  2:07 PM  Result Value Ref Range Status   Specimen Description BLOOD RIGHT FOREARM  Final   Special Requests BOTTLES DRAWN AEROBIC AND ANAEROBIC 8CC EACH  Final   Culture PENDING  Incomplete   Report Status PENDING  Incomplete  C difficile quick scan w PCR reflex     Status: Abnormal   Collection Time: 08/06/16  6:15 PM  Result Value Ref Range Status   C Diff antigen POSITIVE (A) NEGATIVE Final   C Diff toxin NEGATIVE NEGATIVE Final   C Diff interpretation Results are  indeterminate. See PCR results.  Final  MRSA PCR Screening     Status: None   Collection Time: 08/06/16  6:45 PM  Result Value Ref Range Status   MRSA by PCR NEGATIVE NEGATIVE Final    Comment:        The GeneXpert MRSA Assay (FDA approved for NASAL specimens only), is one component of a comprehensive MRSA colonization surveillance program. It is not intended to diagnose MRSA infection nor to guide or monitor treatment for MRSA infections.       Radiology Studies: Dg Chest Port 1 View  Result Date: 08/06/2016 CLINICAL DATA:  Shortness of breath and fever EXAM: PORTABLE CHEST 1 VIEW COMPARISON:  08/06/2016 chest radiograph. FINDINGS: Partially visualized hemiarthroplasty in both shoulders. Stable cardiomediastinal silhouette with top-normal heart size. No pneumothorax. No pleural effusion. No pulmonary edema. No acute consolidative airspace disease. IMPRESSION: No active disease. Electronically Signed   By: Ilona Sorrel M.D.   On: 08/06/2016 17:08   Dg Chest Port 1 View  Result Date: 08/06/2016 CLINICAL DATA:  Patient with fever. EXAM: PORTABLE CHEST 1 VIEW COMPARISON:  Chest radiograph 01/18/2016. FINDINGS: Monitoring leads overlie the patient. Stable enlarged cardiac and mediastinal contours. Pulmonary vascular redistribution. No large area of pulmonary consolidation. No pleural effusion or pneumothorax. Old left rib fractures. IMPRESSION: Cardiomegaly.  Pulmonary vascular redistribution. Electronically Signed   By: Lovey Newcomer M.D.   On: 08/06/2016 14:42     Scheduled Meds: . aspirin  325 mg Oral Daily  . atorvastatin  40 mg Oral Daily  . enoxaparin (LOVENOX) injection  40 mg Subcutaneous Q24H  . famotidine  20 mg Oral BID  . hydrocortisone sod succinate (SOLU-CORTEF) inj  50 mg Intravenous Q8H  . [START ON 08/08/2016] hydrocortisone sod succinate (SOLU-CORTEF) inj  50 mg Intravenous Q12H  . insulin aspart  0-20 Units Subcutaneous Q6H  . insulin glargine  35 Units Subcutaneous  BID  . loratadine  10 mg Oral Daily  . mouth rinse  15 mL Mouth Rinse BID  . montelukast  10 mg Oral  QHS  . pantoprazole  40 mg Oral Daily  . piperacillin-tazobactam (ZOSYN)  IV  3.375 g Intravenous Q8H  . vancomycin  1,250 mg Intravenous Q12H  . vancomycin  125 mg Oral Q6H   Continuous Infusions: . 0.9 % NaCl with KCl 40 mEq / L 125 mL/hr (08/06/16 2000)  . norepinephrine (LEVOPHED) Adult infusion Stopped (08/07/16 0432)    Marzetta Board, MD, PhD [facility] Pager [phone]  If 7PM-7AM, please contact night-coverage [email or web address] Password [facility] 08/07/2016, 9:38 AM

## 2016-08-08 DIAGNOSIS — A045 Campylobacter enteritis: Secondary | ICD-10-CM

## 2016-08-08 DIAGNOSIS — I4891 Unspecified atrial fibrillation: Secondary | ICD-10-CM

## 2016-08-08 LAB — GASTROINTESTINAL PANEL BY PCR, STOOL (REPLACES STOOL CULTURE)
ADENOVIRUS F40/41: NOT DETECTED
ASTROVIRUS: NOT DETECTED
CAMPYLOBACTER SPECIES: DETECTED — AB
Cryptosporidium: NOT DETECTED
Cyclospora cayetanensis: NOT DETECTED
ENTEROAGGREGATIVE E COLI (EAEC): NOT DETECTED
ENTEROPATHOGENIC E COLI (EPEC): NOT DETECTED
ENTEROTOXIGENIC E COLI (ETEC): NOT DETECTED
Entamoeba histolytica: NOT DETECTED
Giardia lamblia: NOT DETECTED
NOROVIRUS GI/GII: NOT DETECTED
PLESIMONAS SHIGELLOIDES: NOT DETECTED
ROTAVIRUS A: NOT DETECTED
Salmonella species: NOT DETECTED
Sapovirus (I, II, IV, and V): NOT DETECTED
Shiga like toxin producing E coli (STEC): NOT DETECTED
Shigella/Enteroinvasive E coli (EIEC): NOT DETECTED
Vibrio cholerae: NOT DETECTED
Vibrio species: NOT DETECTED
Yersinia enterocolitica: NOT DETECTED

## 2016-08-08 LAB — GLUCOSE, CAPILLARY
GLUCOSE-CAPILLARY: 290 mg/dL — AB (ref 65–99)
GLUCOSE-CAPILLARY: 178 mg/dL — AB (ref 65–99)
GLUCOSE-CAPILLARY: 74 mg/dL (ref 65–99)
Glucose-Capillary: 197 mg/dL — ABNORMAL HIGH (ref 65–99)
Glucose-Capillary: 163 mg/dL — ABNORMAL HIGH (ref 65–99)

## 2016-08-08 LAB — CBC
HEMATOCRIT: 46.7 % (ref 39.0–52.0)
HEMOGLOBIN: 15.5 g/dL (ref 13.0–17.0)
MCH: 34.4 pg — ABNORMAL HIGH (ref 26.0–34.0)
MCHC: 33.2 g/dL (ref 30.0–36.0)
MCV: 103.5 fL — AB (ref 78.0–100.0)
Platelets: 121 10*3/uL — ABNORMAL LOW (ref 150–400)
RBC: 4.51 MIL/uL (ref 4.22–5.81)
RDW: 13.5 % (ref 11.5–15.5)
WBC: 13.5 10*3/uL — AB (ref 4.0–10.5)

## 2016-08-08 LAB — COMPREHENSIVE METABOLIC PANEL
ALBUMIN: 2.8 g/dL — AB (ref 3.5–5.0)
ALT: 33 U/L (ref 17–63)
ANION GAP: 9 (ref 5–15)
AST: 45 U/L — AB (ref 15–41)
Alkaline Phosphatase: 57 U/L (ref 38–126)
BUN: 13 mg/dL (ref 6–20)
CHLORIDE: 108 mmol/L (ref 101–111)
CO2: 21 mmol/L — ABNORMAL LOW (ref 22–32)
Calcium: 8.4 mg/dL — ABNORMAL LOW (ref 8.9–10.3)
Creatinine, Ser: 0.85 mg/dL (ref 0.61–1.24)
GFR calc Af Amer: >60 mL/min (ref >60)
GFR calc non Af Amer: >60 mL/min (ref >60)
GLUCOSE: 219 mg/dL — AB (ref 65–99)
POTASSIUM: 3.9 mmol/L (ref 3.5–5.1)
Sodium: 138 mmol/L (ref 135–145)
Total Bilirubin: 0.7 mg/dL (ref 0.3–1.2)
Total Protein: 6.2 g/dL — ABNORMAL LOW (ref 6.5–8.1)

## 2016-08-08 LAB — URINE CULTURE: CULTURE: NO GROWTH

## 2016-08-08 LAB — TSH: TSH: 1.061 u[IU]/mL (ref 0.350–4.500)

## 2016-08-08 LAB — CLOSTRIDIUM DIFFICILE BY PCR: CDIFFPCR: POSITIVE — AB

## 2016-08-08 LAB — MAGNESIUM: Magnesium: 2 mg/dL (ref 1.7–2.4)

## 2016-08-08 MED ORDER — METRONIDAZOLE 500 MG PO TABS
500.0000 mg | ORAL_TABLET | Freq: Three times a day (TID) | ORAL | Status: DC
  Administered 2016-08-08 – 2016-08-10 (×6): 500 mg via ORAL
  Filled 2016-08-08 (×6): qty 1

## 2016-08-08 MED ORDER — APIXABAN 5 MG PO TABS
5.0000 mg | ORAL_TABLET | Freq: Two times a day (BID) | ORAL | Status: DC
  Administered 2016-08-08 – 2016-08-10 (×4): 5 mg via ORAL
  Filled 2016-08-08 (×4): qty 1

## 2016-08-08 MED ORDER — INSULIN ASPART 100 UNIT/ML ~~LOC~~ SOLN: 0.0000 [IU] | Freq: Every day | SUBCUTANEOUS | Status: DC

## 2016-08-08 MED ORDER — CIPROFLOXACIN HCL 250 MG PO TABS
750.0000 mg | ORAL_TABLET | Freq: Two times a day (BID) | ORAL | Status: DC
  Administered 2016-08-08 – 2016-08-10 (×4): 750 mg via ORAL
  Filled 2016-08-08 (×5): qty 3

## 2016-08-08 MED ORDER — CIPROFLOXACIN HCL 250 MG PO TABS: 500.0000 mg | ORAL_TABLET | Freq: Two times a day (BID) | ORAL | Status: DC

## 2016-08-08 MED ORDER — METOPROLOL TARTRATE 50 MG PO TABS
50.0000 mg | ORAL_TABLET | Freq: Two times a day (BID) | ORAL | Status: DC
  Administered 2016-08-08 – 2016-08-10 (×4): 50 mg via ORAL
  Filled 2016-08-08 (×5): qty 1

## 2016-08-08 MED ORDER — OFF THE BEAT BOOK
Freq: Once | Status: DC
  Filled 2016-08-08: qty 1

## 2016-08-08 MED ORDER — INSULIN ASPART 100 UNIT/ML ~~LOC~~ SOLN
0.0000 [IU] | Freq: Three times a day (TID) | SUBCUTANEOUS | Status: DC
Start: 2016-08-08 — End: 2016-08-10
  Administered 2016-08-08 – 2016-08-09 (×4): 4 [IU] via SUBCUTANEOUS
  Administered 2016-08-09: 3 [IU] via SUBCUTANEOUS
  Administered 2016-08-10: 4 [IU] via SUBCUTANEOUS

## 2016-08-08 MED ORDER — ALBUTEROL SULFATE (2.5 MG/3ML) 0.083% IN NEBU
2.5000 mg | INHALATION_SOLUTION | RESPIRATORY_TRACT | Status: DC | PRN
  Administered 2016-08-09: 2.5 mg via RESPIRATORY_TRACT
  Filled 2016-08-08: qty 3

## 2016-08-08 NOTE — Progress Notes (Signed)
Inpatient Diabetes Program Recommendations  AACE/ADA: New Consensus Statement on Inpatient Glycemic Control (2015)  Target Ranges:  Prepandial:   less than 140 mg/dL      Peak postprandial:   less than 180 mg/dL (1-2 hours)      Critically ill patients:  140 - 180 mg/dL   Results for Tony Cantu, Tony Cantu (MRN [id number]) as of 08/08/2016 11:55  Ref. Range 08/07/2016 07:52 08/07/2016 12:14 08/07/2016 16:37 08/07/2016 22:13  Glucose-Capillary Latest Ref Range: 65 - 99 mg/dL 155 (H) 203 (H) 197 (H) 299 (H)   Results for Tony Cantu, Tony Cantu (MRN [id number]) as of 08/08/2016 11:55  Ref. Range 08/08/2016 07:50 08/08/2016 11:48  Glucose-Capillary Latest Ref Range: 65 - 99 mg/dL 178 (H) 197 (H)    Admit Sepsis.   History: DM  Home DM Meds: Lantus 75 units BID        Humalog 20-50 units TID  Current Orders: Lantus 35 units BID       Novolog Resistant Correction Scale/ SSI (0-20 units) TID AC + HS      -Patient currently getting Solucortef 50 mg BID.  -Having fasting and postprandial glucose elevations.     MD- Please consider the following in-hospital insulin adjustments:  1. Increase Lantus to 40 units BID  2. Start Novolog Meal Coverage: Novolog 4 units TID with meals (hold if pt eats <50% of meal)     --Will follow patient during hospitalization--  Wyn Quaker RN, MSN, CDE Diabetes Coordinator Inpatient Glycemic Control Team Team Pager: [phone] (8a-5pm)

## 2016-08-08 NOTE — Progress Notes (Signed)
ANTICOAGULATION CONSULT NOTE - Initial Consult  Pharmacy Consult for Apixaban Indication: atrial fibrillation  Allergies  Allergen Reactions  . Erythromycin Rash  . Sulfonamide Derivatives Rash    Patient Measurements: Height: 5' 10"  (177.8 cm) Weight: 271 lb 2.7 oz (123 kg) IBW/kg (Calculated) : 73  Vital Signs: Temp: 99.5 F (37.5 C) (12/11 1539) Temp Source: Oral (12/11 1539) BP: 158/64 (12/11 1600) Pulse Rate: 82 (12/11 1400)  Labs:  Recent Labs  08/06/16 1406 08/07/16 0448 08/08/16 0408  HGB 17.2* 13.9 15.5  HCT 51.6 41.8 46.7  PLT 126* 138* 121*  CREATININE 1.38* 0.97 0.85    Estimated Creatinine Clearance: 112.5 mL/min (by C-G formula based on SCr of 0.85 mg/dL).   Medical History: Past Medical History:  Diagnosis Date  . Arthritis   . Asthma   . BPH (benign prostatic hypertrophy)   . Colon polyps   . Depression    no meds  . Gastritis   . GERD (gastroesophageal reflux disease)   . Hypertension   . IDDM (insulin dependent diabetes mellitus) ([place])   . Sleep apnea    uses CPAP nightly  . Testicle trouble    both     Medications:  Prescriptions Prior to Admission  Medication Sig Dispense Refill Last Dose  . aspirin 325 MG EC tablet Take 325 mg by mouth daily.   08/06/2016 at Unknown time  . atorvastatin (LIPITOR) 40 MG tablet Take 1 tablet (40 mg total) by mouth daily. 90 tablet 3 08/05/2016 at Unknown time  . b complex vitamins tablet Take 1 tablet by mouth daily.     08/05/2016 at Unknown time  . Calcium Carbonate (CALTRATE 600 PO) Take by mouth daily.     08/05/2016 at Unknown time  . Cholecalciferol (VITAMIN D3) 2000 UNITS capsule Take 2,000 Units by mouth daily.     08/05/2016 at Unknown time  . fexofenadine (ALLEGRA) 180 MG tablet Take 180 mg by mouth daily.   08/06/2016 at Unknown time  . Flaxseed, Linseed, (FLAX SEED OIL PO) Take 1 capsule by mouth daily.   08/06/2016 at Unknown time  . furosemide (LASIX) 20 MG tablet Take 1.5 tablets (30 mg  total) by mouth daily. 135 tablet 1 08/06/2016 at Unknown time  . Glucosamine-Chondroit-Vit C-Mn (GLUCOSAMINE 1500 COMPLEX) CAPS Take by mouth daily.     08/05/2016 at Unknown time  . insulin glargine (LANTUS) 100 UNIT/ML injection Inject 0.75 mLs (75 Units total) into the skin 2 (two) times daily. As directed 12 vial 3 08/06/2016 at Unknown time  . insulin lispro (HUMALOG) 100 UNIT/ML injection Inject 0.2-0.5 mLs (20-50 Units total) into the skin 3 (three) times daily before meals. Sliding scale 12 vial 3 08/06/2016 at Unknown time  . montelukast (SINGULAIR) 10 MG tablet Take 1 tablet (10 mg total) by mouth at bedtime. 90 tablet 3 08/05/2016 at Unknown time  . MULTIPLE VITAMIN PO Take 1 tablet by mouth daily.    08/05/2016 at Unknown time  . oseltamivir (TAMIFLU) 75 MG capsule Take 1 capsule (75 mg total) by mouth 2 (two) times daily. 10 capsule 0 08/06/2016 at Unknown time  . pantoprazole (PROTONIX) 40 MG tablet Take 1 tablet (40 mg total) by mouth daily. 90 tablet 3 08/06/2016 at Unknown time  . ranitidine (ZANTAC) 75 MG tablet Take 75 mg by mouth at bedtime.   08/05/2016 at Unknown time  . telmisartan (MICARDIS) 40 MG tablet Take 40 mg by mouth daily.   08/06/2016 at Unknown time  . Testosterone (  AXIRON) 30 MG/ACT SOLN Place 1 application onto the skin daily. Place 1 application under each axilla daily 270 mL 3 08/05/2016 at Unknown time  . valsartan (DIOVAN) 160 MG tablet TAKE 1 TABLET DAILY (Patient taking differently: TAKE 1 TABLET AT BEDTIME) 90 tablet 0 08/05/2016 at Unknown time  . B-D ULTRA-FINE 33 LANCETS MISC Check Blood sugar 8 times daily and PRN.DX E11.9 800 each 3 Taking  . EPIPEN 2-PAK 0.3 MG/0.3ML SOAJ injection Inject 0.3 mg into the skin once.    never  . glucose blood (ONE TOUCH ULTRA TEST) test strip chesk BS q 2 hours PRN.dx.E11.9 800 each 3 Taking  . HYDROcodone-acetaminophen (NORCO) 5-325 MG tablet Take 1 tablet by mouth every 6 (six) hours as needed for moderate pain. 20 tablet 0  unknown  . Insulin Syringe-Needle U-100 (BD INSULIN SYRINGE ULTRAFINE) 31G X 15/64" 1 ML MISC Inject 1 Syringe into the skin 2 (two) times daily. Dx.E11.9 180 each 3 Taking  . Insulin Syringe-Needle U-100 (BD INSULIN SYRINGE ULTRAFINE) 31G X 5/16" 0.5 ML MISC USE TO INJECT 8 TO 10 TIMES DAILY, dx E11.9 800 each 3 Taking  . traMADol (ULTRAM) 50 MG tablet Take 1 tablet (50 mg total) by mouth every 6 (six) hours as needed. (Patient taking differently: Take 50 mg by mouth every 6 (six) hours as needed for moderate pain. ) 30 tablet 0 unknown    Assessment: 65 year old obese male with insulin-dependent diabetes mellitus, OSA on CPAP, hypertension, GERD, asthma, BPH presented with fever and chills. Patient was in septic shock.  Overnight patient went into Atrial fib with RVR and started on Cardizem drip. Now starting anticoagulation with eliquis for afib.  Goal of Therapy:  Monitor platelets by anticoagulation protocol: Yes   Plan:  Eliquis 108m po BID Monitor for S/S of bleeding Educate on new medication  LIsac Sarna BS PVena Austria BHighlandPharmacist Pager #[phone]/06/2016,5:00 PM

## 2016-08-08 NOTE — Progress Notes (Addendum)
PROGRESS NOTE                                                                                                                                                                                                             Patient Demographics:    Tony Cantu, is a 65 y.o. male, DOB - [birth year], TTS:[id number]  Admit date - 08/06/2016   Admitting Physician Truett Mainland, DO  Outpatient Primary MD for the patient is Redge Gainer, MD  LOS - 2  Outpatient Specialists:none  Chief Complaint  Patient presents with  . Fever       Brief Narrative    65 year old obese male with insulin-dependent diabetes mellitus, OSA on CPAP, hypertension, GERD, asthma, BPH presented with fever and chills. He recorded temperature which was 140F.took several doses of Tylenol, ibuprofen and aspirin and also had a corresponding back. He then had 3 episodes of nonbloody diarrhea. When he got off from the bathroom he passed out. Denies any trauma to his head and neck. He informs that he did have bowel and urinary incontinence but feels that he was at the time when he was having diarrhea. He was syncopal again when he tried to sit up. EMS was called and was found to have systolic blood pressure in the 80s. CBG was 141. He was given IV fluid and brought to the ED.     Subjective:   Patient was in septic shock code sepsis was called in the ED, received IV fluid bolus. He was started on Levophed, empiric IV antibiotics and admitted to stepdown unit. Overnight patient went into A. fib with RVR and started on Cardizem drip.   Assessment  & Plan :    Principal Problem:   Septic shock ([place]) Possibly GI source. C. difficile antigen positive but toxin negative. GI pathogen panel positive for Campylobacter species. Now afebrile and diarrhea improved. Will transition antibiotic to empiric ciprofloxacin ( 750 mg twice a day ) and Flagyl. Duration of treatment  would be 2 weeks. Sepsis appears to have resolved. Off pressors. Will discontinue IV hydrocortisone.  Active Problems: Atrial fibrillation with RVR New onset. Possibly triggered by sepsis. 2-D echo done 3 months back with EF of 60-65% with some diastolic dysfunction. TSH is normal. HisCHADS2vasc is 83 ( age >36, HTN and DM). He will benefit from being on adequate  ideation. Discussed different options for anticoagulation, recent benefit. He chooses to be on NOAC.  I started him on eliquis.  Patient now in sinus. Cardizem drip discontinued. Will place him on metoprolol 50 mg twice a day.      Diabetes type 2, controlled (HCC) A1c 5.2. On Lantus 25 units twice a day and sliding scale coverage.     Essential hypertension, benign Home medications discontinued due to hypotension. Now started on beta blocker.   Hyperlipidemia Continue statin.  OSA Continue CPAP  Rt toe fracture Secondary to fall. Apply ice and leg elevation. Buddy tape on discharge.  Increased somnolence Possibly due to narcotics ( vicodin, tramadol) and prn ativan. D/c all monitor on tylenol    Code Status : Full Code  Family Communication  : None at bedside  Disposition Plan  : Home possibly in the next 24-48 hours if continues to improve. Transfer to telemetry  Barriers For Discharge : Active symptoms  Consults  :  None  Procedures  : None  DVT Prophylaxis  :  Eliquis  Lab Results  Component Value Date   PLT 121 (L) 08/08/2016    Antibiotics  :   Anti-infectives    Start     Dose/Rate Route Frequency Ordered Stop   08/08/16 2000  ciprofloxacin (CIPRO) tablet 500 mg     500 mg Oral 2 times daily 08/08/16 1650     08/08/16 1700  metroNIDAZOLE (FLAGYL) tablet 500 mg     500 mg Oral Every 8 hours 08/08/16 1650     08/07/16 0600  vancomycin (VANCOCIN) 50 mg/mL oral solution 125 mg  Status:  Discontinued     125 mg Oral Every 6 hours 08/07/16 0330 08/08/16 1650   08/06/16 2200   piperacillin-tazobactam (ZOSYN) IVPB 3.375 g  Status:  Discontinued     3.375 g 12.5 mL/hr over 240 Minutes Intravenous Every 8 hours 08/06/16 1854 08/08/16 1650   08/06/16 1900  vancomycin (VANCOCIN) 1,250 mg in sodium chloride 0.9 % 250 mL IVPB  Status:  Discontinued     1,250 mg 166.7 mL/hr over 90 Minutes Intravenous Every 12 hours 08/06/16 1854 08/08/16 1650   08/06/16 1430  piperacillin-tazobactam (ZOSYN) IVPB 3.375 g     3.375 g 100 mL/hr over 30 Minutes Intravenous  Once 08/06/16 1426 08/06/16 1532   08/06/16 1430  vancomycin (VANCOCIN) IVPB 1000 mg/200 mL premix     1,000 mg 200 mL/hr over 60 Minutes Intravenous  Once 08/06/16 1426 08/06/16 1634        Objective:   Vitals:   08/08/16 1400 08/08/16 1500 08/08/16 1539 08/08/16 1600  BP: (!) 142/84 (!) 142/72  (!) 158/64  Pulse: 82     Resp: (!) 26 (!) 34  (!) 29  Temp:   99.5 F (37.5 C)   TempSrc:   Oral   SpO2: 100%     Weight:      Height:        Wt Readings from Last 3 Encounters:  08/08/16 123 kg (271 lb 2.7 oz)  07/05/16 119.7 kg (264 lb)  06/27/16 121.5 kg (267 lb 12.8 oz)     Intake/Output Summary (Last 24 hours) at 08/08/16 1650 Last data filed at 08/08/16 1336  Gross per 24 hour  Intake          2626.67 ml  Output             1877 ml  Net  749.67 ml     Physical Exam  Gen: not in distress HEENT:moist mucosa, supple neck Chest: clear b/l, no added sounds CVS: s1&S2 irregularly irregular, no murmurs, rubs or gallop GI: soft, NT, ND, BS+ Musculoskeletal: warm, no edema, bruising with swelling over right second and third toe. CNS: AAOX3    Data Review:    CBC  Recent Labs Lab 08/06/16 1406 08/07/16 0448 08/08/16 0408  WBC 14.8* 15.0* 13.5*  HGB 17.2* 13.9 15.5  HCT 51.6 41.8 46.7  PLT 126* 138* 121*  MCV 104.9* 104.8* 103.5*  MCH 35.0* 34.8* 34.4*  MCHC 33.3 33.3 33.2  RDW 13.6 13.6 13.5  LYMPHSABS 0.7  --   --   MONOABS 2.0*  --   --   EOSABS 0.0  --   --   BASOSABS  0.0  --   --     Chemistries   Recent Labs Lab 08/06/16 1406 08/07/16 0448 08/08/16 0408  NA 138 138 138  K 3.8 3.9 3.9  CL 104 112* 108  CO2 23 22 21*  GLUCOSE 121* 154* 219*  BUN 24* 14 13  CREATININE 1.38* 0.97 0.85  CALCIUM 8.7* 7.4* 8.4*  MG  --   --  2.0  AST 32 35 45*  ALT 28 25 33  ALKPHOS 68 45 57  BILITOT 1.4* 1.0 0.7   ------------------------------------------------------------------------------------------------------------------ No results for input(s): CHOL, HDL, LDLCALC, TRIG, CHOLHDL, LDLDIRECT in the last 72 hours.  Lab Results  Component Value Date   HGBA1C 5.2 01/05/2016   ------------------------------------------------------------------------------------------------------------------  Recent Labs  08/07/16 2247  TSH 1.061   ------------------------------------------------------------------------------------------------------------------ No results for input(s): VITAMINB12, FOLATE, FERRITIN, TIBC, IRON, RETICCTPCT in the last 72 hours.  Coagulation profile No results for input(s): INR, PROTIME in the last 168 hours.  No results for input(s): DDIMER in the last 72 hours.  Cardiac Enzymes No results for input(s): CKMB, TROPONINI, MYOGLOBIN in the last 168 hours.  Invalid input(s): CK ------------------------------------------------------------------------------------------------------------------    Component Value Date/Time   BNP 10.6 04/21/2016 1148    Inpatient Medications  Scheduled Meds: . aspirin  325 mg Oral Daily  . atorvastatin  40 mg Oral Daily  . ciprofloxacin  500 mg Oral BID  . enoxaparin (LOVENOX) injection  40 mg Subcutaneous Q24H  . famotidine  20 mg Oral BID  . hydrocortisone sod succinate (SOLU-CORTEF) inj  50 mg Intravenous Q12H  . insulin aspart  0-20 Units Subcutaneous TID WC  . insulin aspart  0-5 Units Subcutaneous QHS  . insulin glargine  35 Units Subcutaneous BID  . loratadine  10 mg Oral Daily  . mouth  rinse  15 mL Mouth Rinse BID  . metoprolol tartrate  50 mg Oral BID  . metroNIDAZOLE  500 mg Oral Q8H  . montelukast  10 mg Oral QHS  . off the beat book   Does not apply Once  . pantoprazole  40 mg Oral Daily   Continuous Infusions: . sodium chloride 100 mL/hr at 08/08/16 0112  . norepinephrine (LEVOPHED) Adult infusion Stopped (08/07/16 0432)   PRN Meds:.acetaminophen **OR** acetaminophen, albuterol, HYDROcodone-acetaminophen, LORazepam, ondansetron **OR** ondansetron (ZOFRAN) IV, phenol, traMADol  Micro Results Recent Results (from the past 240 hour(s))  Urine culture     Status: None   Collection Time: 08/06/16  1:24 PM  Result Value Ref Range Status   Specimen Description URINE, CLEAN CATCH  Final   Special Requests NONE  Final   Culture NO GROWTH Performed at [facility]  Final   Report Status 08/08/2016 FINAL  Final  Blood Culture (routine x 2)     Status: None (Preliminary result)   Collection Time: 08/06/16  2:06 PM  Result Value Ref Range Status   Specimen Description BLOOD RIGHT HAND  Final   Special Requests BOTTLES DRAWN AEROBIC AND ANAEROBIC 8CC EACH  Final   Culture NO GROWTH 2 DAYS  Final   Report Status PENDING  Incomplete  Blood Culture (routine x 2)     Status: None (Preliminary result)   Collection Time: 08/06/16  2:07 PM  Result Value Ref Range Status   Specimen Description BLOOD RIGHT FOREARM  Final   Special Requests BOTTLES DRAWN AEROBIC AND ANAEROBIC 8CC EACH  Final   Culture NO GROWTH 2 DAYS  Final   Report Status PENDING  Incomplete  C difficile quick scan w PCR reflex     Status: Abnormal   Collection Time: 08/06/16  6:15 PM  Result Value Ref Range Status   C Diff antigen POSITIVE (A) NEGATIVE Final   C Diff toxin NEGATIVE NEGATIVE Final   C Diff interpretation Results are indeterminate. See PCR results.  Final  Gastrointestinal Panel by PCR , Stool     Status: Abnormal   Collection Time: 08/06/16  6:15 PM  Result Value Ref Range  Status   Campylobacter species DETECTED (A) NOT DETECTED Final    Comment: RESULT CALLED TO, READ BACK BY AND VERIFIED WITH: REBECCA CANTERBURY 08/08/16 1528 SGD    Plesimonas shigelloides NOT DETECTED NOT DETECTED Final   Salmonella species NOT DETECTED NOT DETECTED Final   Yersinia enterocolitica NOT DETECTED NOT DETECTED Final   Vibrio species NOT DETECTED NOT DETECTED Final   Vibrio cholerae NOT DETECTED NOT DETECTED Final   Enteroaggregative E coli (EAEC) NOT DETECTED NOT DETECTED Final   Enteropathogenic E coli (EPEC) NOT DETECTED NOT DETECTED Final   Enterotoxigenic E coli (ETEC) NOT DETECTED NOT DETECTED Final   Shiga like toxin producing E coli (STEC) NOT DETECTED NOT DETECTED Final   Shigella/Enteroinvasive E coli (EIEC) NOT DETECTED NOT DETECTED Final   Cryptosporidium NOT DETECTED NOT DETECTED Final   Cyclospora cayetanensis NOT DETECTED NOT DETECTED Final   Entamoeba histolytica NOT DETECTED NOT DETECTED Final   Giardia lamblia NOT DETECTED NOT DETECTED Final   Adenovirus F40/41 NOT DETECTED NOT DETECTED Final   Astrovirus NOT DETECTED NOT DETECTED Final   Norovirus GI/GII NOT DETECTED NOT DETECTED Final   Rotavirus A NOT DETECTED NOT DETECTED Final   Sapovirus (I, II, IV, and V) NOT DETECTED NOT DETECTED Final  Clostridium Difficile by PCR     Status: Abnormal   Collection Time: 08/06/16  6:15 PM  Result Value Ref Range Status   Toxigenic C Difficile by pcr POSITIVE (A) NEGATIVE Final    Comment: Positive for toxigenic C. difficile with little to no toxin production. Only treat if clinical presentation suggests symptomatic illness. Performed at [facility]   MRSA PCR Screening     Status: None   Collection Time: 08/06/16  6:45 PM  Result Value Ref Range Status   MRSA by PCR NEGATIVE NEGATIVE Final    Comment:        The GeneXpert MRSA Assay (FDA approved for NASAL specimens only), is one component of a comprehensive MRSA colonization surveillance program.  It is not intended to diagnose MRSA infection nor to guide or monitor treatment for MRSA infections.     Radiology Reports Dg Chest [facility] 1 View  Result  Date: 08/06/2016 CLINICAL DATA:  Shortness of breath and fever EXAM: PORTABLE CHEST 1 VIEW COMPARISON:  08/06/2016 chest radiograph. FINDINGS: Partially visualized hemiarthroplasty in both shoulders. Stable cardiomediastinal silhouette with top-normal heart size. No pneumothorax. No pleural effusion. No pulmonary edema. No acute consolidative airspace disease. IMPRESSION: No active disease. Electronically Signed   By: Ilona Sorrel M.D.   On: 08/06/2016 17:08   Dg Chest Port 1 View  Result Date: 08/06/2016 CLINICAL DATA:  Patient with fever. EXAM: PORTABLE CHEST 1 VIEW COMPARISON:  Chest radiograph 01/18/2016. FINDINGS: Monitoring leads overlie the patient. Stable enlarged cardiac and mediastinal contours. Pulmonary vascular redistribution. No large area of pulmonary consolidation. No pleural effusion or pneumothorax. Old left rib fractures. IMPRESSION: Cardiomegaly.  Pulmonary vascular redistribution. Electronically Signed   By: Lovey Newcomer M.D.   On: 08/06/2016 14:42   Dg Foot Complete Right  Result Date: 08/07/2016 CLINICAL DATA:  Past at yesterday. Bruising to the second and third toes with pain. Initial encounter. EXAM: RIGHT FOOT COMPLETE - 3+ VIEW COMPARISON:  None. FINDINGS: On the lateral image, nondisplaced oblique fractures are questioned through the dorsal aspects of the bases of the middle phalanges of the second and third toes. These are not confirmed on the other projections, and the middle phalanx of the third toe is partially obscured on the lateral image by the proximal phalanx of the second toe which may be accounting for this appearance. There is a mildly displaced avulsion fracture of the dorsal navicular. There is no dislocation. A moderate plantar calcaneal enthesophyte is noted. There is mild soft tissue swelling along the  dorsum of the foot. IMPRESSION: 1. Avulsion fracture of the dorsal navicular. 2. Questionable nondisplaced fractures of the middle phalanges of the second and third toes. Electronically Signed   By: Logan Bores M.D.   On: 08/07/2016 17:02    Time Spent in minutes  35   Louellen Molder M.D on 08/08/2016 at 4:50 PM  Between 7am to 7pm - Pager - [phone]  After 7pm go to [email or web address] - password [facility]  [facility] -  Office  [phone]

## 2016-08-08 NOTE — Progress Notes (Signed)
Patient converted to Sinus Rhythm. EKG completed. Dr. Clementeen Graham notified.

## 2016-08-08 NOTE — Plan of Care (Signed)
Problem: Cardiac: Goal: Ability to achieve and maintain adequate cardiopulmonary perfusion will improve Outcome: Progressing Patient understands Afib and also understands that this is new for him.

## 2016-08-09 ENCOUNTER — Inpatient Hospital Stay (HOSPITAL_COMMUNITY): Payer: Medicare Other

## 2016-08-09 ENCOUNTER — Telehealth: Payer: Self-pay | Admitting: Family Medicine

## 2016-08-09 ENCOUNTER — Encounter (HOSPITAL_COMMUNITY): Payer: Self-pay

## 2016-08-09 DIAGNOSIS — I4891 Unspecified atrial fibrillation: Secondary | ICD-10-CM

## 2016-08-09 DIAGNOSIS — I493 Ventricular premature depolarization: Secondary | ICD-10-CM

## 2016-08-09 LAB — VANCOMYCIN, TROUGH: Vancomycin Tr: 4 ug/mL — ABNORMAL LOW (ref 15–20)

## 2016-08-09 LAB — ECHOCARDIOGRAM COMPLETE
HEIGHTINCHES: 70 in
WEIGHTICAEL: 4373.93 [oz_av]

## 2016-08-09 LAB — GLUCOSE, CAPILLARY
GLUCOSE-CAPILLARY: 85 mg/dL (ref 65–99)
GLUCOSE-CAPILLARY: 167 mg/dL — AB (ref 65–99)
GLUCOSE-CAPILLARY: 120 mg/dL — AB (ref 65–99)
Glucose-Capillary: 77 mg/dL (ref 65–99)
Glucose-Capillary: 123 mg/dL — ABNORMAL HIGH (ref 65–99)

## 2016-08-09 MED ORDER — INSULIN GLARGINE 100 UNIT/ML ~~LOC~~ SOLN
25.0000 [IU] | Freq: Two times a day (BID) | SUBCUTANEOUS | Status: DC
  Administered 2016-08-09: 25 [IU] via SUBCUTANEOUS
  Filled 2016-08-09 (×4): qty 0.25

## 2016-08-09 MED ORDER — TRAMADOL HCL 50 MG PO TABS
50.0000 mg | ORAL_TABLET | Freq: Four times a day (QID) | ORAL | Status: DC | PRN
  Administered 2016-08-10: 50 mg via ORAL
  Filled 2016-08-09: qty 1

## 2016-08-09 MED ORDER — ZOLPIDEM TARTRATE 5 MG PO TABS
5.0000 mg | ORAL_TABLET | Freq: Once | ORAL | Status: AC
  Administered 2016-08-10: 5 mg via ORAL
  Filled 2016-08-09: qty 1

## 2016-08-09 NOTE — Telephone Encounter (Signed)
[place], MD [facility] 08/09/2016, 12:25 PM

## 2016-08-09 NOTE — Plan of Care (Signed)
Problem: Activity: Goal: Risk for activity intolerance will decrease Outcome: Progressing Patient stated that he was feeling more weak. Discussed the need to get up and move. Patient wants to get up in the chair for lunch.

## 2016-08-09 NOTE — Progress Notes (Signed)
Patient transferred to room 304. Wife notified of transfer.

## 2016-08-09 NOTE — Discharge Instructions (Signed)

## 2016-08-09 NOTE — Progress Notes (Signed)
*  PRELIMINARY RESULTS* Echocardiogram 2D Echocardiogram has been performed.  Leavy Cella 08/09/2016, 3:50 PM

## 2016-08-09 NOTE — Telephone Encounter (Signed)
FYI Pt is in ICU at [facility] for C-diff per pt's wife

## 2016-08-09 NOTE — Progress Notes (Signed)
PROGRESS NOTE                                                                                                                                                                                                             Patient Demographics:    Tony Cantu, is a 65 y.o. male, DOB - 1951-04-30, QDU:[id number]  Admit date - 08/06/2016   Admitting Physician Truett Mainland, DO  Outpatient Primary MD for the patient is Redge Gainer, MD  LOS - 3  Outpatient Specialists:none  Chief Complaint  Patient presents with  . Fever       Brief Narrative    65 year old obese male with insulin-dependent diabetes mellitus, OSA on CPAP, hypertension, GERD, asthma, BPH presented with fever and chills. He recorded temperature which was 140F.took several doses of Tylenol, ibuprofen and aspirin and also had a corresponding back. He then had 3 episodes of nonbloody diarrhea. When he got off from the bathroom he passed out. Denies any trauma to his head and neck. He informs that he did have bowel and urinary incontinence but feels that he was at the time when he was having diarrhea. He was syncopal again when he tried to sit up. EMS was called and was found to have systolic blood pressure in the 80s. CBG was 141. He was given IV fluid and brought to the ED. Patient was in septic shock and code sepsis calling the ED. Given IV fluid bolus and placed on empiric IV antibiotics. Required short duration of pressors and admitted to ICU. Hospital course prolonged with development of rapid A. fib.   Subjective:   Patient converted to sinus. Off Cardizem drip and started metoprolol. Also started on oral anticoagulation. Diarrhea since yesterday. Last evening he was quite somnolent, pain medications and benzos were discontinued. Multiple PVCs on the monitor.   Assessment  & Plan :    Principal Problem:   Septic shock ([place]) Possibly GI source. C. difficile  antigen positive but toxin negative. GI pathogen panel positive for Campylobacter species. -Low Grade fever, diarrhea improved. transitioned antibiotic to empiric ciprofloxacin ( 750 mg twice a day ) and Flagyl. Will treat with total 7 days of ciprofloxacin (3 day course is usually enough for mild cases of Campylobacter but since patient was quite septic decided to treat for 7 days). Treat with total 10  days of Flagyl for possible C. difficile. Since now resolved. Off pressors. Transfer to telemetry.  Active Problems: Atrial fibrillation with RVR -New onset. Possibly triggered by sepsis. 2-D echo done 3 months back with EF of 60-65% with some diastolic dysfunction. TSH is normal. -HisCHADS2vasc is 19 ( age >44, HTN and DM). Discussed different options for anticoagulation, risk and benefit. He chooses to be on NOAC.  I started him on eliquis.  -Patient now in sinus. Cardizem drip discontinued. Placed on metoprolol 50 mg twice a day.  -Multiple PVCs on monitor. Lites normal. Will check repeat 2-D echo. Titrate beta blocker as needed.     Diabetes type 2, controlled ([place]) Well controlled. A1c 4.7. Reduce his Lantus to 25 units twice a day. Continue sliding scale coverage.     Essential hypertension, benign Home medications discontinued due to hypotension. Now started on beta blocker.   Hyperlipidemia Continue statin.  OSA Continue CPAP  Rt toe fracture Secondary to fall. Apply ice and leg elevation. Buddy tape on discharge.  Increased somnolence Possibly due to narcotics ( vicodin, tramadol) and prn ativan. D/c all . Now stable.  High MCV?? TSH normal. Pt doesn't drink alcohol.   Code Status : Full Code  Family Communication  : None at bedside. Discussed on the phone with wife on 12/11  Disposition Plan  : Home possibly in am if HR stable, dense afebrile and following PT eval.  Barriers For Discharge : improving symptoms  Consults  :  None  Procedures  : 2d echo  DVT  Prophylaxis  :  Eliquis  Lab Results  Component Value Date   PLT 121 (L) 08/08/2016    Antibiotics  :   Anti-infectives    Start     Dose/Rate Route Frequency Ordered Stop   08/08/16 2000  ciprofloxacin (CIPRO) tablet 500 mg  Status:  Discontinued     500 mg Oral 2 times daily 08/08/16 1650 08/08/16 1717   08/08/16 2000  ciprofloxacin (CIPRO) tablet 750 mg     750 mg Oral 2 times daily 08/08/16 1717     08/08/16 1700  metroNIDAZOLE (FLAGYL) tablet 500 mg     500 mg Oral Every 8 hours 08/08/16 1650     08/07/16 0600  vancomycin (VANCOCIN) 50 mg/mL oral solution 125 mg  Status:  Discontinued     125 mg Oral Every 6 hours 08/07/16 0330 08/08/16 1650   08/06/16 2200  piperacillin-tazobactam (ZOSYN) IVPB 3.375 g  Status:  Discontinued     3.375 g 12.5 mL/hr over 240 Minutes Intravenous Every 8 hours 08/06/16 1854 08/08/16 1650   08/06/16 1900  vancomycin (VANCOCIN) 1,250 mg in sodium chloride 0.9 % 250 mL IVPB  Status:  Discontinued     1,250 mg 166.7 mL/hr over 90 Minutes Intravenous Every 12 hours 08/06/16 1854 08/08/16 1650   08/06/16 1430  piperacillin-tazobactam (ZOSYN) IVPB 3.375 g     3.375 g 100 mL/hr over 30 Minutes Intravenous  Once 08/06/16 1426 08/06/16 1532   08/06/16 1430  vancomycin (VANCOCIN) IVPB 1000 mg/200 mL premix     1,000 mg 200 mL/hr over 60 Minutes Intravenous  Once 08/06/16 1426 08/06/16 1634        Objective:   Vitals:   08/09/16 0900 08/09/16 1000 08/09/16 1100 08/09/16 1200  BP: 123/76 124/65 131/67 131/85  Pulse: 80 75 84 72  Resp: 17 (!) 25 15 12   Temp:   99.5 F (37.5 C)   TempSrc:   Oral  SpO2: 97% 92% 95% 94%  Weight:      Height:        Wt Readings from Last 3 Encounters:  08/09/16 124 kg (273 lb 5.9 oz)  07/05/16 119.7 kg (264 lb)  06/27/16 121.5 kg (267 lb 12.8 oz)     Intake/Output Summary (Last 24 hours) at 08/09/16 1306 Last data filed at 08/09/16 1125  Gross per 24 hour  Intake             1750 ml  Output              1050 ml  Net              700 ml     Physical Exam  Gen: not in distress HEENT:moist mucosa, supple neck Chest: clear b/l, no added sounds CVS: s1&S2 regular, no murmurs, rubs or gallop GI: soft, NT, ND, BS+ Musculoskeletal: warm, no edema, bruising with swelling over right second and third toe. CNS: AAOX3    Data Review:    CBC  Recent Labs Lab 08/06/16 1406 08/07/16 0448 08/08/16 0408  WBC 14.8* 15.0* 13.5*  HGB 17.2* 13.9 15.5  HCT 51.6 41.8 46.7  PLT 126* 138* 121*  MCV 104.9* 104.8* 103.5*  MCH 35.0* 34.8* 34.4*  MCHC 33.3 33.3 33.2  RDW 13.6 13.6 13.5  LYMPHSABS 0.7  --   --   MONOABS 2.0*  --   --   EOSABS 0.0  --   --   BASOSABS 0.0  --   --     Chemistries   Recent Labs Lab 08/06/16 1406 08/07/16 0448 08/08/16 0408  NA 138 138 138  K 3.8 3.9 3.9  CL 104 112* 108  CO2 23 22 21*  GLUCOSE 121* 154* 219*  BUN 24* 14 13  CREATININE 1.38* 0.97 0.85  CALCIUM 8.7* 7.4* 8.4*  MG  --   --  2.0  AST 32 35 45*  ALT 28 25 33  ALKPHOS 68 45 57  BILITOT 1.4* 1.0 0.7   ------------------------------------------------------------------------------------------------------------------ No results for input(s): CHOL, HDL, LDLCALC, TRIG, CHOLHDL, LDLDIRECT in the last 72 hours.  Lab Results  Component Value Date   HGBA1C 5.2 01/05/2016   ------------------------------------------------------------------------------------------------------------------  Recent Labs  08/07/16 2247  TSH 1.061   ------------------------------------------------------------------------------------------------------------------ No results for input(s): VITAMINB12, FOLATE, FERRITIN, TIBC, IRON, RETICCTPCT in the last 72 hours.  Coagulation profile No results for input(s): INR, PROTIME in the last 168 hours.  No results for input(s): DDIMER in the last 72 hours.  Cardiac Enzymes No results for input(s): CKMB, TROPONINI, MYOGLOBIN in the last 168 hours.  Invalid input(s):  CK ------------------------------------------------------------------------------------------------------------------    Component Value Date/Time   BNP 10.6 04/21/2016 1148    Inpatient Medications  Scheduled Meds: . apixaban  5 mg Oral BID  . atorvastatin  40 mg Oral Daily  . ciprofloxacin  750 mg Oral BID  . famotidine  20 mg Oral BID  . insulin aspart  0-20 Units Subcutaneous TID WC  . insulin aspart  0-5 Units Subcutaneous QHS  . insulin glargine  35 Units Subcutaneous BID  . mouth rinse  15 mL Mouth Rinse BID  . metoprolol tartrate  50 mg Oral BID  . metroNIDAZOLE  500 mg Oral Q8H  . montelukast  10 mg Oral QHS  . off the beat book   Does not apply Once  . pantoprazole  40 mg Oral Daily   Continuous Infusions:  PRN Meds:.acetaminophen **OR** acetaminophen, albuterol, ondansetron **  OR** ondansetron (ZOFRAN) IV, phenol  Micro Results Recent Results (from the past 240 hour(s))  Urine culture     Status: None   Collection Time: 08/06/16  1:24 PM  Result Value Ref Range Status   Specimen Description URINE, CLEAN CATCH  Final   Special Requests NONE  Final   Culture NO GROWTH Performed at [facility]   Final   Report Status 08/08/2016 FINAL  Final  Blood Culture (routine x 2)     Status: None (Preliminary result)   Collection Time: 08/06/16  2:06 PM  Result Value Ref Range Status   Specimen Description BLOOD RIGHT HAND  Final   Special Requests BOTTLES DRAWN AEROBIC AND ANAEROBIC [place]  Final   Culture   Final    NO GROWTH 3 DAYS Performed at [facility]    Report Status PENDING  Incomplete  Blood Culture (routine x 2)     Status: None (Preliminary result)   Collection Time: 08/06/16  2:07 PM  Result Value Ref Range Status   Specimen Description BLOOD RIGHT FOREARM  Final   Special Requests BOTTLES DRAWN AEROBIC AND ANAEROBIC 8CC EACH  Final   Culture NO GROWTH 2 DAYS  Final   Report Status PENDING  Incomplete  C difficile quick scan w PCR  reflex     Status: Abnormal   Collection Time: 08/06/16  6:15 PM  Result Value Ref Range Status   C Diff antigen POSITIVE (A) NEGATIVE Final   C Diff toxin NEGATIVE NEGATIVE Final   C Diff interpretation Results are indeterminate. See PCR results.  Final  Gastrointestinal Panel by PCR , Stool     Status: Abnormal   Collection Time: 08/06/16  6:15 PM  Result Value Ref Range Status   Campylobacter species DETECTED (A) NOT DETECTED Final    Comment: RESULT CALLED TO, READ BACK BY AND VERIFIED WITH: REBECCA CANTERBURY 08/08/16 1528 SGD    Plesimonas shigelloides NOT DETECTED NOT DETECTED Final   Salmonella species NOT DETECTED NOT DETECTED Final   Yersinia enterocolitica NOT DETECTED NOT DETECTED Final   Vibrio species NOT DETECTED NOT DETECTED Final   Vibrio cholerae NOT DETECTED NOT DETECTED Final   Enteroaggregative E coli (EAEC) NOT DETECTED NOT DETECTED Final   Enteropathogenic E coli (EPEC) NOT DETECTED NOT DETECTED Final   Enterotoxigenic E coli (ETEC) NOT DETECTED NOT DETECTED Final   Shiga like toxin producing E coli (STEC) NOT DETECTED NOT DETECTED Final   Shigella/Enteroinvasive E coli (EIEC) NOT DETECTED NOT DETECTED Final   Cryptosporidium NOT DETECTED NOT DETECTED Final   Cyclospora cayetanensis NOT DETECTED NOT DETECTED Final   Entamoeba histolytica NOT DETECTED NOT DETECTED Final   Giardia lamblia NOT DETECTED NOT DETECTED Final   Adenovirus F40/41 NOT DETECTED NOT DETECTED Final   Astrovirus NOT DETECTED NOT DETECTED Final   Norovirus GI/GII NOT DETECTED NOT DETECTED Final   Rotavirus A NOT DETECTED NOT DETECTED Final   Sapovirus (I, II, IV, and V) NOT DETECTED NOT DETECTED Final  Clostridium Difficile by PCR     Status: Abnormal   Collection Time: 08/06/16  6:15 PM  Result Value Ref Range Status   Toxigenic C Difficile by pcr POSITIVE (A) NEGATIVE Final    Comment: Positive for toxigenic C. difficile with little to no toxin production. Only treat if clinical  presentation suggests symptomatic illness. Performed at [facility]   MRSA PCR Screening     Status: None   Collection Time: 08/06/16  6:45  PM  Result Value Ref Range Status   MRSA by PCR NEGATIVE NEGATIVE Final    Comment:        The GeneXpert MRSA Assay (FDA approved for NASAL specimens only), is one component of a comprehensive MRSA colonization surveillance program. It is not intended to diagnose MRSA infection nor to guide or monitor treatment for MRSA infections.     Radiology Reports Dg Chest Port 1 View  Result Date: 08/06/2016 CLINICAL DATA:  Shortness of breath and fever EXAM: PORTABLE CHEST 1 VIEW COMPARISON:  08/06/2016 chest radiograph. FINDINGS: Partially visualized hemiarthroplasty in both shoulders. Stable cardiomediastinal silhouette with top-normal heart size. No pneumothorax. No pleural effusion. No pulmonary edema. No acute consolidative airspace disease. IMPRESSION: No active disease. Electronically Signed   By: Ilona Sorrel M.D.   On: 08/06/2016 17:08   Dg Chest Port 1 View  Result Date: 08/06/2016 CLINICAL DATA:  Patient with fever. EXAM: PORTABLE CHEST 1 VIEW COMPARISON:  Chest radiograph 01/18/2016. FINDINGS: Monitoring leads overlie the patient. Stable enlarged cardiac and mediastinal contours. Pulmonary vascular redistribution. No large area of pulmonary consolidation. No pleural effusion or pneumothorax. Old left rib fractures. IMPRESSION: Cardiomegaly.  Pulmonary vascular redistribution. Electronically Signed   By: Lovey Newcomer M.D.   On: 08/06/2016 14:42   Dg Foot Complete Right  Result Date: 08/07/2016 CLINICAL DATA:  Past at yesterday. Bruising to the second and third toes with pain. Initial encounter. EXAM: RIGHT FOOT COMPLETE - 3+ VIEW COMPARISON:  None. FINDINGS: On the lateral image, nondisplaced oblique fractures are questioned through the dorsal aspects of the bases of the middle phalanges of the second and third toes. These are not  confirmed on the other projections, and the middle phalanx of the third toe is partially obscured on the lateral image by the proximal phalanx of the second toe which may be accounting for this appearance. There is a mildly displaced avulsion fracture of the dorsal navicular. There is no dislocation. A moderate plantar calcaneal enthesophyte is noted. There is mild soft tissue swelling along the dorsum of the foot. IMPRESSION: 1. Avulsion fracture of the dorsal navicular. 2. Questionable nondisplaced fractures of the middle phalanges of the second and third toes. Electronically Signed   By: Logan Bores M.D.   On: 08/07/2016 17:02    Time Spent in minutes  35   Louellen Molder M.D on 08/09/2016 at 1:06 PM  Between 7am to 7pm - Pager - [phone]  After 7pm go to [email or web address] - password [facility]  [facility] -  Office  [phone]

## 2016-08-10 DIAGNOSIS — A419 Sepsis, unspecified organism: Principal | ICD-10-CM

## 2016-08-10 DIAGNOSIS — R6521 Severe sepsis with septic shock: Secondary | ICD-10-CM

## 2016-08-10 LAB — GLUCOSE, CAPILLARY
GLUCOSE-CAPILLARY: 52 mg/dL — AB (ref 65–99)
GLUCOSE-CAPILLARY: 95 mg/dL (ref 65–99)
Glucose-Capillary: 188 mg/dL — ABNORMAL HIGH (ref 65–99)

## 2016-08-10 MED ORDER — APIXABAN 5 MG PO TABS: 5.0000 mg | ORAL_TABLET | Freq: Two times a day (BID) | ORAL | 3 refills | Status: DC

## 2016-08-10 MED ORDER — METOPROLOL TARTRATE 50 MG PO TABS: 50.0000 mg | ORAL_TABLET | Freq: Two times a day (BID) | ORAL | 3 refills | Status: DC

## 2016-08-10 MED ORDER — CIPROFLOXACIN HCL 750 MG PO TABS: 750.0000 mg | ORAL_TABLET | Freq: Two times a day (BID) | ORAL | 0 refills | Status: DC

## 2016-08-10 NOTE — Progress Notes (Signed)
Pt IV and telemetry removed, tolerated well. Reviewed discharge instructions with pt and wife at bedside.  Answered questions at this time.  Pt will discharge home once home O2 supply is delivered.

## 2016-08-10 NOTE — Progress Notes (Signed)
Hypoglycemic Event  CBG: 52  Treatment: 15 GM carbohydrate snack  Symptoms: None  Follow-up CBG: [id number] CBG Result:95  Possible Reasons for Event: Unknown  Comments/MD notified:[place] via [place], [place]

## 2016-08-10 NOTE — Evaluation (Signed)
Physical Therapy Evaluation Patient Details Name: Tony Cantu MRN: [id number] DOB: [birth year] Today's Date: 08/10/2016   History of Present Illness  65 y.o. male with a history of insulin-dependent diabetes, GERD, hypertension, sleep apnea with C Pap use, BPH, arthritis, asthma. Patient presents with approximately 36 hours of not feeling well with increasing body temperature and chills. Patient was having chills last night and then woke up at 3 AM this morning with fever of 104. He had several doses of Tylenol, ibuprofen, aspirin and attempts to reduce his fever. Additionally, he had cold sponge bath. His fever decreased a little. Additionally, he had 3 episodes of nonbloody diarrhea. When the patient got up from the bathroom, he passed out - she caught him and gradually lowered him to the floor without any head or neck trauma. There was no seizure activity. Every time he sat up, the patient became syncopal. EMS was called and the patient was found to have an initial blood pressure of 80. He had a CBG of 141. He was given him in 100 mL's of IV fluid in route. Prior to coming, the patient did take 40 mg of Lasix at home.    Clinical Impression  Pt received in bed, and was agreeable to PT evaluation.  Pt expressed that he was independent with ambulation, as well ADL's, IADL's, and driving.  During PT evaluation, he performed all transfers at independent level, and ambulated 170f with no AD independently.  Pt does not demonstrate need for skilled PT at this point, therefore, will d/c PT.      Follow Up Recommendations No PT follow up    Equipment Recommendations  None recommended by PT    Recommendations for Other Services       Precautions / Restrictions Precautions Precautions: Fall Precaution Comments: passed out just prior to admission.   Restrictions Weight Bearing Restrictions: No      Mobility  Bed Mobility Overal bed mobility: Independent                 Transfers Overall transfer level: Modified independent Equipment used: None                Ambulation/Gait Ambulation/Gait assistance: Independent Ambulation Distance (Feet): 120 Feet Assistive device: None Gait Pattern/deviations: Step-through pattern   Gait velocity interpretation: <1.8 ft/sec, indicative of risk for recurrent falls    Stairs            Wheelchair Mobility    Modified Rankin (Stroke Patients Only)       Balance Overall balance assessment: History of Falls;Independent                                           Pertinent Vitals/Pain Pain Assessment: 0-10 Pain Location: joint discomfort.  Pain Intervention(s): Limited activity within patient's tolerance;Monitored during session;Repositioned    Home Living   Living Arrangements: Spouse/significant other (wife works as a nMarine scientistduring the day. )   Type of Home: [place] Two level;Laundry or work area in [place] WEnvironmental consultant- 2 wheels;Cane - single point      Prior Function Level of Independence: Independent   Gait / Transfers Assistance Needed: independent.   ADL's / Homemaking Assistance Needed: independent with ADL's and IADL's, driving, does not work         HJournalist, newspaper  Extremity/Trunk Assessment   Upper Extremity Assessment: Overall WFL for tasks assessed           Lower Extremity Assessment: Overall WFL for tasks assessed         Communication   Communication: No difficulties  Cognition Arousal/Alertness: Awake/alert Behavior During Therapy: WFL for tasks assessed/performed Overall Cognitive Status: Within Functional Limits for tasks assessed                      General Comments      Exercises     Assessment/Plan    PT Assessment Patent does not need any further PT services  PT Problem List            PT Treatment Interventions Gait training;Functional mobility  training;Therapeutic activities;Balance training    PT Goals (Current goals can be found in the Care Plan section)  Acute Rehab PT Goals PT Goal Formulation: All assessment and education complete, DC therapy    Frequency     Barriers to discharge        Co-evaluation               End of Session Equipment Utilized During Treatment: Gait belt;Oxygen Activity Tolerance: Patient tolerated treatment well Patient left: in chair;with call bell/phone within reach Nurse Communication: Mobility status Artist, RN notified, and mobility sheet left up in the room. )    Functional Assessment Tool Used: [place] "6-clicks"  Functional Limitation: Mobility: Walking and moving around Mobility: Walking and Moving Around Current Status [phone]): 0 percent impaired, limited or restricted Mobility: Walking and Moving Around Goal Status [phone]): 0 percent impaired, limited or restricted Mobility: Walking and Moving Around Discharge Status [phone]): 0 percent impaired, limited or restricted    Time: 4128-7867 PT Time Calculation (min) (ACUTE ONLY): 31 min   Charges:   PT Evaluation $PT Eval Low Complexity: 1 Procedure PT Treatments $Gait Training: 8-22 mins   PT G Codes:   PT G-Codes **NOT FOR INPATIENT CLASS** Functional Assessment Tool Used: [employer] "6-clicks"  Functional Limitation: Mobility: Walking and moving around Mobility: Walking and Moving Around Current Status [phone]): 0 percent impaired, limited or restricted Mobility: Walking and Moving Around Goal Status [phone]): 0 percent impaired, limited or restricted Mobility: Walking and Moving Around Discharge Status [phone]): 0 percent impaired, limited or restricted    Beth Fredick Schlosser, PT, DPT X: [phone]

## 2016-08-10 NOTE — Care Management Note (Signed)
Case Management Note  Patient Details  Name: Tony Cantu MRN: [id number] Date of Birth: 1950/09/09  Subjective/Objective:                  Pt is from home, lives with wife and is ind with ADL's. He is ind with ADL's. He uses no DME with ambulation. Pt does have CPAP through [facility] but has no oxygen or neb machine PTA. Pt has PCP, drives himself to appointments and has no difficulty affording or managing medications. Pt with new a-fib. He will DC on Eliquis, 30-day voucher given. Pt has met requirements for supplemental oxygen. Pt has chosen [facility] from list of DME providers and will have port oxygen tank delivered to room prior to discharge. Romualdo Bolk, of [facility], aware of referral and will obtain pt info from chart.   Action/Plan: Pt discharging home today with self care and supplemental oxygen.   Expected Discharge Date:  08/09/16               Expected Discharge Plan:  Home/Self Care  In-House Referral:  NA  Discharge planning Services  CM Consult  Post Acute Care Choice:  Durable Medical Equipment Choice offered to:  Patient  DME Arranged:  Oxygen DME Agency:  [facility].  Status of Service:  Completed, signed off  Sherald Barge, RN 08/10/2016, 2:18 PM

## 2016-08-10 NOTE — Discharge Summary (Signed)
Physician Discharge Summary  Tony Cantu:[id number] DOB: Apr 02, 1951 DOA: 08/06/2016  PCP: Redge Gainer, MD  Admit date: 08/06/2016 Discharge date: 08/10/2016  Time spent: 45 minutes  Recommendations for Outpatient Follow-up:  -Will be discharged home today. -Advised to follow up with PCP in 2 weeks. -Will continue cipro for 5 more days.   Discharge Diagnoses:  Principal Problem:   Septic shock ([place]) Active Problems:   Diabetes type 2, controlled ([place])   Essential hypertension, benign   Acute renal injury ([place])   Diarrhea   Fever   Hyperbilirubinemia   Lactic acidosis   Discharge Condition: Stable and improved  Filed Weights   08/07/16 0500 08/08/16 0500 08/09/16 0500  Weight: 124.3 kg (274 lb 0.5 oz) 123 kg (271 lb 2.7 oz) 124 kg (273 lb 5.9 oz)    History of present illness:  As per Dr. Nehemiah Settle on 12/9: Tony Cantu is a 65 y.o. male with a history of insulin-dependent diabetes, GERD, hypertension, sleep apnea with C Pap use, BPH, arthritis, asthma. Patient presents with approximately 36 hours of not feeling well with increasing body temperature and chills. Patient was having chills last night and then woke up at 3 AM this morning with fever of 104. He had several doses of Tylenol, ibuprofen, aspirin and attempts to reduce his fever. Additionally, he had cold sponge bath. His fever decreased a little. Additionally, he had 3 episodes of nonbloody diarrhea. When the patient got up from the bathroom, he passed out - she caught him and gradually lowered him to the floor without any head or neck trauma. There was no seizure activity. Every time he sat up, the patient became syncopal. EMS was called and the patient was found to have an initial blood pressure of 80. He had a CBG of 141. He was given him in 100 mL's of IV fluid in route. Prior to coming, the patient did take 40 mg of Lasix at home.  The patient denies cough, wheezing, shortness of breath. He does complain  of generalized myalgias and arthralgias. He had called his PCP this morning was prescribed Tamiflu.  Emergency Department Course: Code sepsis called at the time of patient's arrival. The patient received 30 mL/kg of IV fluids. Patient continued to be hypotensive and levophed was started. The patient received broad-spectrum antibiotics.  Hospital Course:   Septic shock ([place]) GI pathogen panel positive for Campylobacter species. -Low Grade fever, diarrhea improved. transitioned antibiotic to empiric ciprofloxacin ( 750 mg twice a day ) and Flagyl. Will DC flagyl at this point given negative c diff toxin does not indicate a new infection (and we already have a source of the diarrhea with the positive c diff toxin. Agree with continuing cipro given his campylobacter with sepsis.  Atrial fibrillation with RVR -New onset. Possibly triggered by sepsis. 2-D echo done 3 months back with EF of 60-65% with some diastolic dysfunction. TSH is normal. -HisCHADS2vasc is 64 ( age >66, HTN and DM). Discussed different options for anticoagulation, risk and benefit. He chooses to be on NOAC.  I started him on eliquis.  -Patient now in sinus. Cardizem drip discontinued. Placed on metoprolol 50 mg twice a day for rate control.  -Multiple PVCs on monitor. Lites normal. Will check repeat 2-D echo. Titrate beta blocker as needed.     Diabetes type 2, controlled ([place]) Well controlled.    Essential hypertension, benign Hypotensive on admission; now resolved   Hyperlipidemia Continue statin.  OSA Continue CPAP  Rt toe fracture Secondary to fall. Apply ice and leg elevation. Buddy tape on discharge.  Increased somnolence Possibly due to narcotics ( vicodin, tramadol) and prn ativan.  Now stable. Advised caution when using sedatine meds  High MCV?? TSH normal. Pt doesn't drink alcohol.   Procedures:  None   Consultations:  None  Discharge Instructions  Discharge Instructions     Diet - low sodium heart healthy    Complete by:  As directed    Increase activity slowly    Complete by:  As directed        Medication List    STOP taking these medications   aspirin 325 MG EC tablet   oseltamivir 75 MG capsule Commonly known as:  TAMIFLU   telmisartan 40 MG tablet Commonly known as:  MICARDIS     TAKE these medications   apixaban 5 MG Tabs tablet Commonly known as:  ELIQUIS Take 1 tablet (5 mg total) by mouth 2 (two) times daily.   atorvastatin 40 MG tablet Commonly known as:  LIPITOR Take 1 tablet (40 mg total) by mouth daily.   b complex vitamins tablet Take 1 tablet by mouth daily.   B-D ULTRA-FINE 33 LANCETS Misc Check Blood sugar 8 times daily and PRN.DX E11.9   CALTRATE 600 PO Take by mouth daily.   ciprofloxacin 750 MG tablet Commonly known as:  CIPRO Take 1 tablet (750 mg total) by mouth 2 (two) times daily.   EPIPEN 2-PAK 0.3 mg/0.3 mL Soaj injection Generic drug:  EPINEPHrine Inject 0.3 mg into the skin once.   fexofenadine 180 MG tablet Commonly known as:  ALLEGRA Take 180 mg by mouth daily.   FLAX SEED OIL PO Take 1 capsule by mouth daily.   furosemide 20 MG tablet Commonly known as:  LASIX Take 1.5 tablets (30 mg total) by mouth daily.   GLUCOSAMINE 1500 COMPLEX Caps Take by mouth daily.   glucose blood test strip Commonly known as:  ONE TOUCH ULTRA TEST chesk BS q 2 hours PRN.dx.E11.9   HYDROcodone-acetaminophen 5-325 MG tablet Commonly known as:  NORCO Take 1 tablet by mouth every 6 (six) hours as needed for moderate pain.   insulin glargine 100 UNIT/ML injection Commonly known as:  LANTUS Inject 0.75 mLs (75 Units total) into the skin 2 (two) times daily. As directed   insulin lispro 100 UNIT/ML injection Commonly known as:  HUMALOG Inject 0.2-0.5 mLs (20-50 Units total) into the skin 3 (three) times daily before meals. Sliding scale   Insulin Syringe-Needle U-100 31G X 5/16" 0.5 ML Misc Commonly known as:   BD INSULIN SYRINGE ULTRAFINE USE TO INJECT 8 TO 10 TIMES DAILY, dx E11.9   Insulin Syringe-Needle U-100 31G X 15/64" 1 ML Misc Commonly known as:  BD INSULIN SYRINGE ULTRAFINE Inject 1 Syringe into the skin 2 (two) times daily. Dx.E11.9   metoprolol 50 MG tablet Commonly known as:  LOPRESSOR Take 1 tablet (50 mg total) by mouth 2 (two) times daily.   montelukast 10 MG tablet Commonly known as:  SINGULAIR Take 1 tablet (10 mg total) by mouth at bedtime.   MULTIPLE VITAMIN PO Take 1 tablet by mouth daily.   pantoprazole 40 MG tablet Commonly known as:  PROTONIX Take 1 tablet (40 mg total) by mouth daily.   ranitidine 75 MG tablet Commonly known as:  ZANTAC Take 75 mg by mouth at bedtime.   Testosterone 30 MG/ACT Soln Commonly known as:  AXIRON Place 1 application onto the skin  daily. Place 1 application under each axilla daily   traMADol 50 MG tablet Commonly known as:  ULTRAM Take 1 tablet (50 mg total) by mouth every 6 (six) hours as needed. What changed:  reasons to take this   valsartan 160 MG tablet Commonly known as:  DIOVAN TAKE 1 TABLET DAILY What changed:  See the new instructions.   Vitamin D3 2000 units capsule Take 2,000 Units by mouth daily.      Allergies  Allergen Reactions  . Erythromycin Rash  . Sulfonamide Derivatives Rash   Follow-up Information    Redge Gainer, MD. Schedule an appointment as soon as possible for a visit in 2 week(s).   Specialty:  Family Medicine Contact information: [place] [address] [phone]            The results of significant diagnostics from this hospitalization (including imaging, microbiology, ancillary and laboratory) are listed below for reference.    Significant Diagnostic Studies: Dg Chest Port 1 View  Result Date: 08/06/2016 CLINICAL DATA:  Shortness of breath and fever EXAM: PORTABLE CHEST 1 VIEW COMPARISON:  08/06/2016 chest radiograph. FINDINGS: Partially visualized  hemiarthroplasty in both shoulders. Stable cardiomediastinal silhouette with top-normal heart size. No pneumothorax. No pleural effusion. No pulmonary edema. No acute consolidative airspace disease. IMPRESSION: No active disease. Electronically Signed   By: Ilona Sorrel M.D.   On: 08/06/2016 17:08   Dg Chest Port 1 View  Result Date: 08/06/2016 CLINICAL DATA:  Patient with fever. EXAM: PORTABLE CHEST 1 VIEW COMPARISON:  Chest radiograph 01/18/2016. FINDINGS: Monitoring leads overlie the patient. Stable enlarged cardiac and mediastinal contours. Pulmonary vascular redistribution. No large area of pulmonary consolidation. No pleural effusion or pneumothorax. Old left rib fractures. IMPRESSION: Cardiomegaly.  Pulmonary vascular redistribution. Electronically Signed   By: Lovey Newcomer M.D.   On: 08/06/2016 14:42   Dg Foot Complete Right  Result Date: 08/07/2016 CLINICAL DATA:  Past at yesterday. Bruising to the second and third toes with pain. Initial encounter. EXAM: RIGHT FOOT COMPLETE - 3+ VIEW COMPARISON:  None. FINDINGS: On the lateral image, nondisplaced oblique fractures are questioned through the dorsal aspects of the bases of the middle phalanges of the second and third toes. These are not confirmed on the other projections, and the middle phalanx of the third toe is partially obscured on the lateral image by the proximal phalanx of the second toe which may be accounting for this appearance. There is a mildly displaced avulsion fracture of the dorsal navicular. There is no dislocation. A moderate plantar calcaneal enthesophyte is noted. There is mild soft tissue swelling along the dorsum of the foot. IMPRESSION: 1. Avulsion fracture of the dorsal navicular. 2. Questionable nondisplaced fractures of the middle phalanges of the second and third toes. Electronically Signed   By: Logan Bores M.D.   On: 08/07/2016 17:02    Microbiology: Recent Results (from the past 240 hour(s))  Urine culture      Status: None   Collection Time: 08/06/16  1:24 PM  Result Value Ref Range Status   Specimen Description URINE, CLEAN CATCH  Final   Special Requests NONE  Final   Culture NO GROWTH Performed at [facility]   Final   Report Status 08/08/2016 FINAL  Final  Blood Culture (routine x 2)     Status: None (Preliminary result)   Collection Time: 08/06/16  2:06 PM  Result Value Ref Range Status   Specimen Description BLOOD RIGHT HAND  Final  Special Requests BOTTLES DRAWN AEROBIC AND ANAEROBIC 8CC EACH  Final   Culture   Final    NO GROWTH 4 DAYS Performed at [facility]    Report Status PENDING  Incomplete  Blood Culture (routine x 2)     Status: None (Preliminary result)   Collection Time: 08/06/16  2:07 PM  Result Value Ref Range Status   Specimen Description BLOOD RIGHT FOREARM  Final   Special Requests BOTTLES DRAWN AEROBIC AND ANAEROBIC 8CC EACH  Final   Culture NO GROWTH 4 DAYS  Final   Report Status PENDING  Incomplete  C difficile quick scan w PCR reflex     Status: Abnormal   Collection Time: 08/06/16  6:15 PM  Result Value Ref Range Status   C Diff antigen POSITIVE (A) NEGATIVE Final   C Diff toxin NEGATIVE NEGATIVE Final   C Diff interpretation Results are indeterminate. See PCR results.  Final  Gastrointestinal Panel by PCR , Stool     Status: Abnormal   Collection Time: 08/06/16  6:15 PM  Result Value Ref Range Status   Campylobacter species DETECTED (A) NOT DETECTED Final    Comment: RESULT CALLED TO, READ BACK BY AND VERIFIED WITH: REBECCA CANTERBURY 08/08/16 1528 SGD    Plesimonas shigelloides NOT DETECTED NOT DETECTED Final   Salmonella species NOT DETECTED NOT DETECTED Final   Yersinia enterocolitica NOT DETECTED NOT DETECTED Final   Vibrio species NOT DETECTED NOT DETECTED Final   Vibrio cholerae NOT DETECTED NOT DETECTED Final   Enteroaggregative E coli (EAEC) NOT DETECTED NOT DETECTED Final   Enteropathogenic E coli (EPEC) NOT DETECTED NOT  DETECTED Final   Enterotoxigenic E coli (ETEC) NOT DETECTED NOT DETECTED Final   Shiga like toxin producing E coli (STEC) NOT DETECTED NOT DETECTED Final   Shigella/Enteroinvasive E coli (EIEC) NOT DETECTED NOT DETECTED Final   Cryptosporidium NOT DETECTED NOT DETECTED Final   Cyclospora cayetanensis NOT DETECTED NOT DETECTED Final   Entamoeba histolytica NOT DETECTED NOT DETECTED Final   Giardia lamblia NOT DETECTED NOT DETECTED Final   Adenovirus F40/41 NOT DETECTED NOT DETECTED Final   Astrovirus NOT DETECTED NOT DETECTED Final   Norovirus GI/GII NOT DETECTED NOT DETECTED Final   Rotavirus A NOT DETECTED NOT DETECTED Final   Sapovirus (I, II, IV, and V) NOT DETECTED NOT DETECTED Final  Clostridium Difficile by PCR     Status: Abnormal   Collection Time: 08/06/16  6:15 PM  Result Value Ref Range Status   Toxigenic C Difficile by pcr POSITIVE (A) NEGATIVE Final    Comment: Positive for toxigenic C. difficile with little to no toxin production. Only treat if clinical presentation suggests symptomatic illness. Performed at [facility]   MRSA PCR Screening     Status: None   Collection Time: 08/06/16  6:45 PM  Result Value Ref Range Status   MRSA by PCR NEGATIVE NEGATIVE Final    Comment:        The GeneXpert MRSA Assay (FDA approved for NASAL specimens only), is one component of a comprehensive MRSA colonization surveillance program. It is not intended to diagnose MRSA infection nor to guide or monitor treatment for MRSA infections.      Labs: Basic Metabolic Panel:  Recent Labs Lab 08/06/16 1406 08/07/16 0448 08/08/16 0408  NA 138 138 138  K 3.8 3.9 3.9  CL 104 112* 108  CO2 23 22 21*  GLUCOSE 121* 154* 219*  BUN 24* 14 13  CREATININE 1.38*  0.97 0.85  CALCIUM 8.7* 7.4* 8.4*  MG  --   --  2.0   Liver Function Tests:  Recent Labs Lab 08/06/16 1406 08/07/16 0448 08/08/16 0408  AST 32 35 45*  ALT 28 25 33  ALKPHOS 68 45 57  BILITOT 1.4* 1.0 0.7    PROT 7.2 5.6* 6.2*  ALBUMIN 3.7 2.7* 2.8*   No results for input(s): LIPASE, AMYLASE in the last 168 hours. No results for input(s): AMMONIA in the last 168 hours. CBC:  Recent Labs Lab 08/06/16 1406 08/07/16 0448 08/08/16 0408  WBC 14.8* 15.0* 13.5*  NEUTROABS 12.1*  --   --   HGB 17.2* 13.9 15.5  HCT 51.6 41.8 46.7  MCV 104.9* 104.8* 103.5*  PLT 126* 138* 121*   Cardiac Enzymes: No results for input(s): CKTOTAL, CKMB, CKMBINDEX, TROPONINI in the last 168 hours. BNP: BNP (last 3 results)  Recent Labs  04/21/16 1148  BNP 10.6    ProBNP (last 3 results) No results for input(s): PROBNP in the last 8760 hours.  CBG:  Recent Labs Lab 08/09/16 1119 08/09/16 1613 08/09/16 2131 08/10/16 0723 08/10/16 0749  GLUCAP 123* 167* 120* 52* 95       Signed:  HERNANDEZ ACOSTA,ESTELA  [facility] Pager: [phone] 08/10/2016, 11:16 AM

## 2016-08-10 NOTE — Care Management Important Message (Signed)
Important Message  Patient Details  Name: Tony Cantu MRN: [id number] Date of Birth: 1950/10/01   Medicare Important Message Given:  Yes    Sherald Barge, RN 08/10/2016, 2:16 PM

## 2016-08-10 NOTE — Progress Notes (Signed)
SATURATION QUALIFICATIONS: (This note is used to comply with regulatory documentation for home oxygen)  Patient Saturations on Room Air at Rest = 96%  Patient Saturations on Room Air while Ambulating = 87%  Patient Saturations on 2 Liters of oxygen while Ambulating = 92%  Please briefly explain why patient needs home oxygen: pt oxygen desats while ambulating on room air

## 2016-08-12 ENCOUNTER — Ambulatory Visit (INDEPENDENT_AMBULATORY_CARE_PROVIDER_SITE_OTHER): Payer: Medicare Other | Admitting: Family Medicine

## 2016-08-12 ENCOUNTER — Encounter: Payer: Self-pay | Admitting: Family Medicine

## 2016-08-12 VITALS — BP 91/56 | HR 67 | Temp 98.1°F | Ht 70.0 in | Wt 260.0 lb

## 2016-08-12 DIAGNOSIS — Z794 Long term (current) use of insulin: Secondary | ICD-10-CM | POA: Diagnosis not present

## 2016-08-12 DIAGNOSIS — E1122 Type 2 diabetes mellitus with diabetic chronic kidney disease: Secondary | ICD-10-CM

## 2016-08-12 DIAGNOSIS — R7881 Bacteremia: Secondary | ICD-10-CM | POA: Diagnosis not present

## 2016-08-12 DIAGNOSIS — Z09 Encounter for follow-up examination after completed treatment for conditions other than malignant neoplasm: Secondary | ICD-10-CM

## 2016-08-12 DIAGNOSIS — D696 Thrombocytopenia, unspecified: Secondary | ICD-10-CM | POA: Diagnosis not present

## 2016-08-12 DIAGNOSIS — N182 Chronic kidney disease, stage 2 (mild): Secondary | ICD-10-CM | POA: Diagnosis not present

## 2016-08-12 MED ORDER — CIPROFLOXACIN HCL 750 MG PO TABS: 750.0000 mg | ORAL_TABLET | Freq: Two times a day (BID) | ORAL | 0 refills | Status: DC

## 2016-08-12 NOTE — Patient Instructions (Addendum)
We will get another blood culture on the patient today at current hospital We will draw a CBC BMP and liver function test here We will refill the Cipro for 5 additional days beyond the 5 days that he was prescribed We will tell them to take an Imodium if needed for frequent loose bowel movements He is encouraged to drink plenty of fluids and stay well hydrated The wife, who is a nurse will attempt to wean him off of the oxygen over the next few days. Because of the episode of atrial fibrillation patient will stay on Eliquis

## 2016-08-12 NOTE — Progress Notes (Signed)
Subjective:    Patient ID: Tony Cantu, male    DOB: May 30, 1951, 65 y.o.   MRN: [id number]  HPI Patient here today for hospital follow up from [facility] He was discharged on 08/10/16. The patient was just discharged from the hospital because of vomiting diarrhea and sepsis. He is currently doing better and not having any diarrhea. He still has some insomnia and joint aches and arthralgias. He is also complaining with some shortness of breath. His pulse ox with 2 L of oxygen is 95%. He does have some drainage and congestion. While in the hospital and with the stress he did develop atrial fibrillation and PVCs and was started on Eliquis and metoprolol. He still taking his Cipro for this Campylobacter infection. We spoke with the infectious disease doctor yesterday because of this positive blood culture and he is recommending that we get a repeat blood culture and that he finish the current round of Cipro which he thinks should take care of this infection. The infectious disease doctor plans to see him on follow-up sometime in early January. Patient is doing better and is still on O2. We went ahead and discussed diminishing his O2 and tapering him off of this. He is having some loose bowel movements but we wanted to go on and finish the Cipro and were going to give him another course of 5 days of Cipro. On were also good to make sure he has an appointment with Dr. Linus Salmons, the infectious disease doctor and we will get a CBC BMP and liver function tests on him today.    Patient Active Problem List   Diagnosis Date Noted  . Acute renal injury ([place]) 08/06/2016  . Diarrhea 08/06/2016  . Fever 08/06/2016  . Hyperbilirubinemia 08/06/2016  . Septic shock ([place]) 08/06/2016  . Lactic acidosis 08/06/2016  . Type 2 diabetes mellitus with hyperlipidemia ([place]) 01/18/2016  . Inguinal hernia 12/10/2015  . Right groin pain 11/30/2015  . Thrombocytopenia ([place]) 07/21/2014  . Bilateral carotid bruits 05/11/2014    . Vitamin D deficiency 09/17/2013  . BPH (benign prostatic hyperplasia) 05/22/2013  . 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 Diabetes type 2, controlled ([place]) 01/10/2013  . Essential hypertension, benign 01/10/2013  . OSA (obstructive sleep apnea) 05/16/2012  . Edema 02/21/2012  . At risk for coronary artery disease 03/20/2011  . Obesity 03/20/2011  . WEIGHT GAIN, ABNORMAL 11/09/2010  . Hyperlipidemia 06/22/2010  . ALLERGIC RHINITIS 06/22/2010  . ASTHMA 06/22/2010  . COUGH 06/22/2010   Outpatient Encounter Prescriptions as of 08/12/2016  Medication Sig  . apixaban (ELIQUIS) 5 MG TABS tablet Take 1 tablet (5 mg total) by mouth 2 (two) times daily.  Marland Kitchen atorvastatin (LIPITOR) 40 MG tablet Take 1 tablet (40 mg total) by mouth daily.  Marland Kitchen b complex vitamins tablet Take 1 tablet by mouth daily.    . B-D ULTRA-FINE 33 LANCETS MISC Check Blood sugar 8 times daily and PRN.DX E11.9  . Calcium Carbonate (CALTRATE 600 PO) Take by mouth daily.    . Cholecalciferol (VITAMIN D3) 2000 UNITS capsule Take 2,000 Units by mouth daily.    . ciprofloxacin (CIPRO) 750 MG tablet Take 1 tablet (750 mg total) by mouth 2 (two) times daily.  . fexofenadine (ALLEGRA) 180 MG tablet Take 180 mg by mouth daily.  . Flaxseed, Linseed, (FLAX SEED OIL PO) Take 1 capsule by mouth daily.  . furosemide (LASIX) 20 MG tablet Take 1.5 tablets (30 mg total) by mouth daily.  . Glucosamine-Chondroit-Vit  C-Mn (GLUCOSAMINE 1500 COMPLEX) CAPS Take by mouth daily.    Marland Kitchen glucose blood (ONE TOUCH ULTRA TEST) test strip chesk BS q 2 hours PRN.dx.E11.9  . insulin glargine (LANTUS) 100 UNIT/ML injection Inject 0.75 mLs (75 Units total) into the skin 2 (two) times daily. As directed  . insulin lispro (HUMALOG) 100 UNIT/ML injection Inject 0.2-0.5 mLs (20-50 Units total) into the skin 3 (three) times daily before meals. Sliding scale  . Insulin Syringe-Needle U-100 (BD INSULIN SYRINGE ULTRAFINE) 31G X 15/64" 1 ML MISC Inject 1 Syringe  into the skin 2 (two) times daily. Dx.E11.9  . Insulin Syringe-Needle U-100 (BD INSULIN SYRINGE ULTRAFINE) 31G X 5/16" 0.5 ML MISC USE TO INJECT 8 TO 10 TIMES DAILY, dx E11.9  . metoprolol (LOPRESSOR) 50 MG tablet Take 1 tablet (50 mg total) by mouth 2 (two) times daily.  . montelukast (SINGULAIR) 10 MG tablet Take 1 tablet (10 mg total) by mouth at bedtime.  . MULTIPLE VITAMIN PO Take 1 tablet by mouth daily.   . pantoprazole (PROTONIX) 40 MG tablet Take 1 tablet (40 mg total) by mouth daily.  . ranitidine (ZANTAC) 75 MG tablet Take 75 mg by mouth at bedtime.  . Testosterone (AXIRON) 30 MG/ACT SOLN Place 1 application onto the skin daily. Place 1 application under each axilla daily  . valsartan (DIOVAN) 160 MG tablet TAKE 1 TABLET DAILY (Patient taking differently: TAKE 1 TABLET AT BEDTIME)  . EPIPEN 2-PAK 0.3 MG/0.3ML SOAJ injection Inject 0.3 mg into the skin once.   Marland Kitchen HYDROcodone-acetaminophen (NORCO) 5-325 MG tablet Take 1 tablet by mouth every 6 (six) hours as needed for moderate pain. (Patient not taking: Reported on 08/12/2016)  . traMADol (ULTRAM) 50 MG tablet Take 1 tablet (50 mg total) by mouth every 6 (six) hours as needed. (Patient not taking: Reported on 08/12/2016)   No facility-administered encounter medications on file as of 08/12/2016.       Review of Systems  Constitutional: Negative.   HENT: Positive for postnasal drip.   Eyes: Negative.   Respiratory: Negative.        Throat dry - now on oxygen  Cardiovascular: Negative.   Gastrointestinal: Positive for diarrhea. Negative for nausea.       Gas  Endocrine: Negative.   Genitourinary: Negative.   Musculoskeletal: Negative.   Skin: Negative.   Allergic/Immunologic: Negative.   Neurological: Negative.   Hematological: Negative.   Psychiatric/Behavioral: Negative.        Objective:   Physical Exam  Constitutional: He is oriented to person, place, and time. He appears well-developed and well-nourished. He appears  distressed.  HENT:  Head: Normocephalic and atraumatic.  Mouth/Throat: No oropharyngeal exudate.  Eyes: Conjunctivae and EOM are normal. Pupils are equal, round, and reactive to light. Right eye exhibits no discharge. Left eye exhibits no discharge. No scleral icterus.  Neck: Normal range of motion. Neck supple.  Cardiovascular: Normal rate, regular rhythm and normal heart sounds.   No murmur heard. Pulmonary/Chest: Effort normal and breath sounds normal. He has no wheezes. He has no rales.  Clear anteriorly and posteriorly  Abdominal: Soft. Bowel sounds are normal. He exhibits no mass. There is no tenderness. There is no rebound and no guarding.  Musculoskeletal: He exhibits no edema.  Patient was examined in wheelchair at our emergency entrance  Neurological: He is alert and oriented to person, place, and time. He has normal reflexes. No cranial nerve deficit.  Skin: Skin is warm and dry. No rash noted.  Psychiatric:  He has a normal mood and affect. His behavior is normal. Judgment and thought content normal.  Nursing note and vitals reviewed.  BP (!) 91/56 (BP Location: Left Arm)   Pulse 67   Temp 98.1 F (36.7 C) (Oral)   Ht _0  (1.778 m)   Wt 260 lb (117.9 kg)   SpO2 95%   BMI 37.31 kg/m         Assessment & Plan:  1. Hospital discharge follow-up -The patient will continue to get plenty of rest and fluids. - CBC with Differential/Platelet - BMP8+EGFR - Hepatic function panel - Ambulatory referral to Infectious Disease - Culture, blood (single) w Reflex to ID Panel  2. Positive blood culture -The blood culture was positive for Campylobacter which possibly came from some chicken livers that were eaten a couple days before he got sick. -We will make sure that he has a follow-up with infectious disease until this capsule back to her issue resolves - CBC with Differential/Platelet - BMP8+EGFR - Hepatic function panel - Ambulatory referral to Infectious Disease -  Culture, blood (single) w Reflex to ID Panel  3. 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 Thrombocytopenia ([place]) -CBC today.  Meds ordered this encounter  Medications  . ciprofloxacin (CIPRO) 750 MG tablet    Sig: Take 1 tablet (750 mg total) by mouth 2 (two) times daily.    Dispense:  10 tablet    Refill:  0   Patient Instructions  We will get another blood culture on the patient today at current hospital We will draw a CBC BMP and liver function test here We will refill the Cipro for 5 additional days beyond the 5 days that he was prescribed We will tell them to take an Imodium if needed for frequent loose bowel movements He is encouraged to drink plenty of fluids and stay well hydrated The wife, who is a nurse will attempt to wean him off of the oxygen over the next few days. Because of the episode of atrial fibrillation patient will stay on Eliquis  Arrie Senate MD

## 2016-08-13 LAB — CBC WITH DIFFERENTIAL/PLATELET
BASOS ABS: 0.1 10*3/uL (ref 0.0–0.2)
Basos: 1 %
EOS (ABSOLUTE): 0.2 10*3/uL (ref 0.0–0.4)
Eos: 2 %
HEMATOCRIT: 48 % (ref 37.5–51.0)
HEMOGLOBIN: 15.8 g/dL (ref 13.0–17.7)
Immature Grans (Abs): 0.2 10*3/uL — ABNORMAL HIGH (ref 0.0–0.1)
Immature Granulocytes: 2 %
LYMPHS ABS: 1.3 10*3/uL (ref 0.7–3.1)
Lymphs: 15 %
MCH: 33.2 pg — AB (ref 26.6–33.0)
MCHC: 32.9 g/dL (ref 31.5–35.7)
MCV: 101 fL — AB (ref 79–97)
MONOCYTES: 12 %
MONOS ABS: 1 10*3/uL — AB (ref 0.1–0.9)
Neutrophils Absolute: 6 10*3/uL (ref 1.4–7.0)
Neutrophils: 68 %
Platelets: 199 10*3/uL (ref 150–379)
RBC: 4.76 x10E6/uL (ref 4.14–5.80)
RDW: 13.3 % (ref 12.3–15.4)
WBC: 8.8 10*3/uL (ref 3.4–10.8)

## 2016-08-13 LAB — HEPATIC FUNCTION PANEL
ALK PHOS: 94 IU/L (ref 39–117)
ALT: 99 IU/L — AB (ref 0–44)
AST: 91 IU/L — ABNORMAL HIGH (ref 0–40)
Albumin: 3.3 g/dL — ABNORMAL LOW (ref 3.6–4.8)
BILIRUBIN TOTAL: 0.9 mg/dL (ref 0.0–1.2)
BILIRUBIN, DIRECT: 0.3 mg/dL (ref 0.00–0.40)
Total Protein: 6.2 g/dL (ref 6.0–8.5)

## 2016-08-13 LAB — BMP8+EGFR
BUN/Creatinine Ratio: 13 (ref 10–24)
BUN: 11 mg/dL (ref 8–27)
CO2: 22 mmol/L (ref 18–29)
CREATININE: 0.84 mg/dL (ref 0.76–1.27)
Calcium: 8.7 mg/dL (ref 8.6–10.2)
Chloride: 100 mmol/L (ref 96–106)
GFR, EST AFRICAN AMERICAN: 105 mL/min/{1.73_m2} (ref >59)
GFR, EST NON AFRICAN AMERICAN: 91 mL/min/{1.73_m2} (ref >59)
Glucose: 187 mg/dL — ABNORMAL HIGH (ref 65–99)
POTASSIUM: 4 mmol/L (ref 3.5–5.2)
SODIUM: 142 mmol/L (ref 134–144)

## 2016-08-17 DIAGNOSIS — M4726 Other spondylosis with radiculopathy, lumbar region: Secondary | ICD-10-CM | POA: Diagnosis not present

## 2016-08-17 DIAGNOSIS — M48061 Spinal stenosis, lumbar region without neurogenic claudication: Secondary | ICD-10-CM | POA: Diagnosis not present

## 2016-08-17 LAB — CULTURE, BLOOD (ROUTINE X 2)

## 2016-08-18 DIAGNOSIS — M48062 Spinal stenosis, lumbar region with neurogenic claudication: Secondary | ICD-10-CM | POA: Diagnosis not present

## 2016-08-18 DIAGNOSIS — M5126 Other intervertebral disc displacement, lumbar region: Secondary | ICD-10-CM | POA: Diagnosis not present

## 2016-08-18 DIAGNOSIS — M4726 Other spondylosis with radiculopathy, lumbar region: Secondary | ICD-10-CM | POA: Diagnosis not present

## 2016-08-18 DIAGNOSIS — M5416 Radiculopathy, lumbar region: Secondary | ICD-10-CM | POA: Diagnosis not present

## 2016-08-18 DIAGNOSIS — M5136 Other intervertebral disc degeneration, lumbar region: Secondary | ICD-10-CM | POA: Diagnosis not present

## 2016-08-18 DIAGNOSIS — Z9889 Other specified postprocedural states: Secondary | ICD-10-CM | POA: Diagnosis not present

## 2016-08-18 DIAGNOSIS — M4155 Other secondary scoliosis, thoracolumbar region: Secondary | ICD-10-CM | POA: Diagnosis not present

## 2016-08-18 LAB — CULTURE, BLOOD (SINGLE)

## 2016-08-24 ENCOUNTER — Ambulatory Visit (INDEPENDENT_AMBULATORY_CARE_PROVIDER_SITE_OTHER): Payer: Medicare Other | Admitting: Family Medicine

## 2016-08-24 ENCOUNTER — Encounter: Payer: Self-pay | Admitting: Family Medicine

## 2016-08-24 ENCOUNTER — Ambulatory Visit (INDEPENDENT_AMBULATORY_CARE_PROVIDER_SITE_OTHER): Payer: Medicare Other

## 2016-08-24 VITALS — BP 125/69 | HR 64 | Temp 97.3°F | Ht 70.0 in | Wt 255.0 lb

## 2016-08-24 DIAGNOSIS — M25561 Pain in right knee: Secondary | ICD-10-CM

## 2016-08-24 DIAGNOSIS — M25571 Pain in right ankle and joints of right foot: Secondary | ICD-10-CM | POA: Diagnosis not present

## 2016-08-24 DIAGNOSIS — R7881 Bacteremia: Secondary | ICD-10-CM

## 2016-08-24 DIAGNOSIS — E349 Endocrine disorder, unspecified: Secondary | ICD-10-CM

## 2016-08-24 DIAGNOSIS — I480 Paroxysmal atrial fibrillation: Secondary | ICD-10-CM

## 2016-08-24 DIAGNOSIS — Z09 Encounter for follow-up examination after completed treatment for conditions other than malignant neoplasm: Secondary | ICD-10-CM | POA: Diagnosis not present

## 2016-08-24 NOTE — Patient Instructions (Signed)
We will repeat the blood culture today.  We will try to get an appointment for the patient to see the cardiologist here so he can have clearance for back surgery that is planned by the neurosurgeon If the blood culture is negative the patient may decide not to go see the infectious disease physician with whom he has an appointment on January 9.

## 2016-08-24 NOTE — Progress Notes (Signed)
Subjective:    Patient ID: Tony Cantu, male    DOB: [birth year], 65 y.o.   MRN: [id number]  HPI Patient here today for additional hospital follow up. He is accompanied today by his wife.The patient is feeling better. He still has a lot of weakness and fatigue. He's been seen by the neurosurgeon and the neurosurgeon would like a clearance from the cardiologist because of the episode of atrial fibrillation had in the hospital recently. We will try to schedule this for the patient as soon as possible. He has had more weakness and his had low blood pressures at home. He's been taking metoprolol 50 mg twice daily. The patient is feeling much better and because of lumbar spine surgery that he needs to have he needs to have an appointment with the cardiologist to get cardiac clearance for this. He is on Eliquis for this recent bout of atrial fibrillation 20 was so sick with Campylobacter infection in the hospital. The patient has now finished the ciprofloxacin which she was taking for the Campylobacter infection. He will get one more blood culture. If it is negative he will consider at that time whether he needs to go see the infectious disease doctor, Dr. Linus Salmons.   Patient Active Problem List   Diagnosis Date Noted  . Acute renal injury ([place]) 08/06/2016  . Diarrhea 08/06/2016  . Fever 08/06/2016  . Hyperbilirubinemia 08/06/2016  . Septic shock ([place]) 08/06/2016  . Lactic acidosis 08/06/2016  . Type 2 diabetes mellitus with hyperlipidemia ([place]) 01/18/2016  . Inguinal hernia 12/10/2015  . Right groin pain 11/30/2015  . Thrombocytopenia ([place]) 07/21/2014  . Bilateral carotid bruits 05/11/2014  . Vitamin D deficiency 09/17/2013  . BPH (benign prostatic hyperplasia) 05/22/2013  . 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 Diabetes type 2, controlled ([place]) 01/10/2013  . Essential hypertension, benign 01/10/2013  . OSA (obstructive sleep apnea) 05/16/2012  . Edema 02/21/2012  . At risk for coronary  artery disease 03/20/2011  . Obesity 03/20/2011  . WEIGHT GAIN, ABNORMAL 11/09/2010  . Hyperlipidemia 06/22/2010  . ALLERGIC RHINITIS 06/22/2010  . ASTHMA 06/22/2010  . COUGH 06/22/2010   Outpatient Encounter Prescriptions as of 08/24/2016  Medication Sig  . apixaban (ELIQUIS) 5 MG TABS tablet Take 1 tablet (5 mg total) by mouth 2 (two) times daily.  Marland Kitchen atorvastatin (LIPITOR) 40 MG tablet Take 1 tablet (40 mg total) by mouth daily.  Marland Kitchen b complex vitamins tablet Take 1 tablet by mouth daily.    . B-D ULTRA-FINE 33 LANCETS MISC Check Blood sugar 8 times daily and PRN.DX E11.9  . Calcium Carbonate (CALTRATE 600 PO) Take by mouth daily.    . Cholecalciferol (VITAMIN D3) 2000 UNITS capsule Take 2,000 Units by mouth daily.    Marland Kitchen EPIPEN 2-PAK 0.3 MG/0.3ML SOAJ injection Inject 0.3 mg into the skin once.   . fexofenadine (ALLEGRA) 180 MG tablet Take 180 mg by mouth daily.  . Flaxseed, Linseed, (FLAX SEED OIL PO) Take 1 capsule by mouth daily.  . furosemide (LASIX) 20 MG tablet Take 1.5 tablets (30 mg total) by mouth daily.  . Glucosamine-Chondroit-Vit C-Mn (GLUCOSAMINE 1500 COMPLEX) CAPS Take by mouth daily.    Marland Kitchen glucose blood (ONE TOUCH ULTRA TEST) test strip chesk BS q 2 hours PRN.dx.E11.9  . HYDROcodone-acetaminophen (NORCO) 5-325 MG tablet Take 1 tablet by mouth every 6 (six) hours as needed for moderate pain.  Marland Kitchen insulin glargine (LANTUS) 100 UNIT/ML injection Inject 0.75 mLs (75 Units total) into the skin 2 (  two) times daily. As directed  . insulin lispro (HUMALOG) 100 UNIT/ML injection Inject 0.2-0.5 mLs (20-50 Units total) into the skin 3 (three) times daily before meals. Sliding scale  . Insulin Syringe-Needle U-100 (BD INSULIN SYRINGE ULTRAFINE) 31G X 15/64" 1 ML MISC Inject 1 Syringe into the skin 2 (two) times daily. Dx.E11.9  . Insulin Syringe-Needle U-100 (BD INSULIN SYRINGE ULTRAFINE) 31G X 5/16" 0.5 ML MISC USE TO INJECT 8 TO 10 TIMES DAILY, dx E11.9  . metoprolol (LOPRESSOR) 50 MG  tablet Take 1 tablet (50 mg total) by mouth 2 (two) times daily.  . montelukast (SINGULAIR) 10 MG tablet Take 1 tablet (10 mg total) by mouth at bedtime.  . MULTIPLE VITAMIN PO Take 1 tablet by mouth daily.   . pantoprazole (PROTONIX) 40 MG tablet Take 1 tablet (40 mg total) by mouth daily.  . ranitidine (ZANTAC) 75 MG tablet Take 75 mg by mouth at bedtime.  . Testosterone (AXIRON) 30 MG/ACT SOLN Place 1 application onto the skin daily. Place 1 application under each axilla daily  . traMADol (ULTRAM) 50 MG tablet Take 1 tablet (50 mg total) by mouth every 6 (six) hours as needed.  . valsartan (DIOVAN) 160 MG tablet TAKE 1 TABLET DAILY (Patient taking differently: TAKE 1 TABLET AT BEDTIME)  . [DISCONTINUED] ciprofloxacin (CIPRO) 750 MG tablet Take 1 tablet (750 mg total) by mouth 2 (two) times daily.  . [DISCONTINUED] ciprofloxacin (CIPRO) 750 MG tablet Take 1 tablet (750 mg total) by mouth 2 (two) times daily.   No facility-administered encounter medications on file as of 08/24/2016.       Review of Systems  Constitutional: Positive for fatigue.  HENT: Negative.   Eyes: Negative.   Respiratory: Positive for shortness of breath.   Cardiovascular: Negative.   Gastrointestinal: Negative.   Endocrine: Negative.   Genitourinary: Negative.   Musculoskeletal: Positive for arthralgias (right knee pain and 3 broke toes -right foot and right ankle pain).  Skin: Negative.   Allergic/Immunologic: Negative.   Neurological: Positive for weakness.  Hematological: Negative.   Psychiatric/Behavioral: Negative.        Objective:   Physical Exam  Constitutional: He is oriented to person, place, and time. He appears well-developed and well-nourished.  HENT:  Head: Normocephalic and atraumatic.  Mouth/Throat: No oropharyngeal exudate.  Eyes: Conjunctivae and EOM are normal. Pupils are equal, round, and reactive to light. Right eye exhibits no discharge. Left eye exhibits no discharge. No scleral  icterus.  Neck: Normal range of motion. Neck supple. No thyromegaly present.  Cardiovascular: Normal rate, regular rhythm and normal heart sounds.   No murmur heard. The rhythm was regular at 60/m  Pulmonary/Chest: Effort normal and breath sounds normal. No respiratory distress. He has no wheezes. He has no rales.  Abdominal: Soft. Bowel sounds are normal. He exhibits no mass. There is no tenderness. There is no rebound and no guarding.  Musculoskeletal: Normal range of motion. He exhibits no edema.  Lymphadenopathy:    He has no cervical adenopathy.  Neurological: He is alert and oriented to person, place, and time.  Skin: Skin is warm and dry. No rash noted. No erythema.  Psychiatric: He has a normal mood and affect. His behavior is normal. Judgment and thought content normal.  Nursing note and vitals reviewed.   BP 125/69 (BP Location: Left Arm)   Pulse 64   Temp 97.3 F (36.3 C) (Oral)   Ht 5' 10"  (1.778 m)   Wt 255 lb (115.7 kg)  BMI 36.59 kg/m        Assessment & Plan:  1. Hospital discharge follow-up -The patient continues to improve following the bout with intestinal infection with Campylobacter. During this infection and episode of sepsis he also developed atrial fibrillation and was started on Eliquis and metoprolol. - CBC - EKG 12-Lead  2. Right knee pain, unspecified chronicity - DG Knee 1-2 Views Right; Future  3. Right ankle pain, unspecified chronicity - DG Ankle Complete Right; Future  4. Paroxysmal atrial fibrillation [facility]) -Appointment with cardiology for cardiac clearance for back surgery  5. Positive blood culture -The patient has had one negative blood culture since he left the hospital. His completed all of his Cipro and we will get one more blood culture and then make a decision about where to go from that point on.  Patient Instructions  We will repeat the blood culture today.  We will try to get an appointment for the patient to see the  cardiologist here so he can have clearance for back surgery that is planned by the neurosurgeon If the blood culture is negative the patient may decide not to go see the infectious disease physician with whom he has an appointment on January 9.   Arrie Senate MD

## 2016-08-25 LAB — CBC
HEMATOCRIT: 48.7 % (ref 37.5–51.0)
HEMOGLOBIN: 16.4 g/dL (ref 13.0–17.7)
MCH: 34.3 pg — AB (ref 26.6–33.0)
MCHC: 33.7 g/dL (ref 31.5–35.7)
MCV: 102 fL — AB (ref 79–97)
Platelets: 322 10*3/uL (ref 150–379)
RBC: 4.78 x10E6/uL (ref 4.14–5.80)
RDW: 13.7 % (ref 12.3–15.4)
WBC: 5.9 10*3/uL (ref 3.4–10.8)

## 2016-08-25 LAB — TESTOSTERONE,FREE AND TOTAL
TESTOSTERONE FREE: 7.1 pg/mL (ref 6.6–18.1)
TESTOSTERONE: 593 ng/dL (ref 264–916)

## 2016-08-30 ENCOUNTER — Encounter: Payer: Self-pay | Admitting: Family Medicine

## 2016-08-30 LAB — CULTURE, BLOOD (SINGLE)

## 2016-08-31 NOTE — Progress Notes (Signed)
`      Cardiology Office Note   Date:  09/01/2016   ID:  Tony Cantu, DOB 02-Mar-1951, MRN [id number]  PCP:  Redge Gainer, MD  Cardiologist:   Minus Breeding, MD  Referring:  Redge Gainer, MD  No chief complaint on file.     History of Present Illness: Tony Cantu is a 66 y.o. male who presents for evaluation of possible atrial fib.  He was previously seen by Dr. Aundra Dubin.  He has a HTN, IDDM, &obesity. He had a cardiolite in 2009 that was a normal study. He uses CPAP for his sleep apnea. He has mild carotid disease.  The patient was admitted last month with sepsis.  I reviewed these records.  He essentially had a normal echo with mild AS.  He had new onset atrial fib with RVR.  He was treated with Xarelto.  However, his wife is a Marine scientist and she said she never sought atrial fibrillation when she was watching. Today I reviewed all of the rhythm strips and EKGs and could not find evidence of atrial fibrillation. The most recent EKG demonstrated sinus rhythm. He's not felt well since he was started on metoprolol and Eliquis. He denies any chest pressure, neck or arm discomfort. He's had no shortness of breath, PND or orthopnea. He was going to have treatment of some chronic back problems but this was stopped because he was on blood thinners.  He's not very active but he does do some things such as golfing and yard work during the summer and with this he's not had any cardiovascular symptoms. He is somewhat limited by his hip pain.  Past Medical History:  Diagnosis Date  . Arthritis   . Asthma   . BPH (benign prostatic hypertrophy)   . Colon polyps   . Depression    no meds  . Diabetes mellitus without complication ([place])   . Gastritis   . GERD (gastroesophageal reflux disease)   . History of hiatal hernia   . Hypertension   . Sleep apnea    uses CPAP nightly  . Testicle trouble    both     Past Surgical History:  Procedure Laterality Date  . BACK SURGERY    . CARPAL TUNNEL  RELEASE Right 10/13/2015   Procedure: RIGHT CARPAL TUNNEL RELEASE;  Surgeon: Daryll Brod, MD;  Location: [facility];  Service: Orthopedics;  Laterality: Right;  . CARPAL TUNNEL RELEASE Left 07/05/2016   Procedure: LEFT CARPAL TUNNEL RELEASE;  Surgeon: Daryll Brod, MD;  Location: [facility];  Service: Orthopedics;  Laterality: Left;  . HERNIA REPAIR    . INGUINAL HERNIA REPAIR  2003   right   . KNEE ARTHROSCOPY Right 03/03/2016  . LUMBAR DISC SURGERY  8/96   Dr. Coralyn Mark, discectomy  . SHOULDER SURGERY  11/28/05   left partial  . SHOULDER SURGERY  07/14/2006   RIGHT  . TONSILECTOMY, ADENOIDECTOMY, BILATERAL MYRINGOTOMY AND TUBES       Current Outpatient Prescriptions  Medication Sig Dispense Refill  . apixaban (ELIQUIS) 5 MG TABS tablet Take 1 tablet (5 mg total) by mouth 2 (two) times daily. 60 tablet 3  . atorvastatin (LIPITOR) 40 MG tablet Take 1 tablet (40 mg total) by mouth daily. 90 tablet 3  . b complex vitamins tablet Take 1 tablet by mouth daily.      . B-D ULTRA-FINE 33 LANCETS MISC Check Blood sugar 8 times daily and PRN.DX E11.9 800 each 3  .  Calcium Carbonate (CALTRATE 600 PO) Take by mouth daily.      . Cholecalciferol (VITAMIN D3) 2000 UNITS capsule Take 2,000 Units by mouth daily.      Marland Kitchen EPIPEN 2-PAK 0.3 MG/0.3ML SOAJ injection Inject 0.3 mg into the skin once.     . fexofenadine (ALLEGRA) 180 MG tablet Take 180 mg by mouth daily.    . Flaxseed, Linseed, (FLAX SEED OIL PO) Take 1 capsule by mouth daily.    . furosemide (LASIX) 20 MG tablet Take 1.5 tablets (30 mg total) by mouth daily. 135 tablet 1  . Glucosamine-Chondroit-Vit C-Mn (GLUCOSAMINE 1500 COMPLEX) CAPS Take by mouth daily.      Marland Kitchen glucose blood (ONE TOUCH ULTRA TEST) test strip chesk BS q 2 hours PRN.dx.E11.9 800 each 3  . HYDROcodone-acetaminophen (NORCO) 5-325 MG tablet Take 1 tablet by mouth every 6 (six) hours as needed for moderate pain. 20 tablet 0  . insulin glargine (LANTUS)  100 UNIT/ML injection Inject 0.75 mLs (75 Units total) into the skin 2 (two) times daily. As directed 12 vial 3  . insulin lispro (HUMALOG) 100 UNIT/ML injection Inject 0.2-0.5 mLs (20-50 Units total) into the skin 3 (three) times daily before meals. Sliding scale 12 vial 3  . Insulin Syringe-Needle U-100 (BD INSULIN SYRINGE ULTRAFINE) 31G X 15/64" 1 ML MISC Inject 1 Syringe into the skin 2 (two) times daily. Dx.E11.9 180 each 3  . Insulin Syringe-Needle U-100 (BD INSULIN SYRINGE ULTRAFINE) 31G X 5/16" 0.5 ML MISC USE TO INJECT 8 TO 10 TIMES DAILY, dx E11.9 800 each 3  . metoprolol (LOPRESSOR) 50 MG tablet Take 25 mg by mouth 2 (two) times daily.    . montelukast (SINGULAIR) 10 MG tablet Take 1 tablet (10 mg total) by mouth at bedtime. 90 tablet 3  . MULTIPLE VITAMIN PO Take 1 tablet by mouth daily.     . pantoprazole (PROTONIX) 40 MG tablet Take 1 tablet (40 mg total) by mouth daily. 90 tablet 3  . ranitidine (ZANTAC) 75 MG tablet Take 75 mg by mouth at bedtime.    . Testosterone (AXIRON) 30 MG/ACT SOLN Place 1 application onto the skin daily. Place 1 application under each axilla daily 270 mL 3  . traMADol (ULTRAM) 50 MG tablet Take 1 tablet (50 mg total) by mouth every 6 (six) hours as needed. 30 tablet 0   No current facility-administered medications for this visit.     Allergies:   Erythromycin and Sulfonamide derivatives    Social History:  The patient  reports that he quit smoking about 15 years ago. His smoking use included Cigarettes and Pipe. He quit after 34.00 years of use. He has never used smokeless tobacco. He reports that he does not drink alcohol or use drugs.   Family History:  The patient's family history includes Allergic rhinitis in his sister; Alzheimer's disease in his mother; Asthma in his father; Breast cancer in his maternal grandmother; Diabetes in his mother; Heart attack in his maternal grandmother and mother; Heart failure in his maternal grandmother and mother;  Rheum arthritis in his maternal grandmother; Suicidality in his father.    ROS:  Please see the history of present illness.   Otherwise, review of systems are positive for none.   All other systems are reviewed and negative.    PHYSICAL EXAM: VS:  BP 114/72 (BP Location: Left Arm, Patient Position: Sitting, Cuff Size: Large)   Pulse 66   Ht 5' 10"  (1.778 m)   Wt 261  lb (118.4 kg)   BMI 37.45 kg/m  , BMI Body mass index is 37.45 kg/m. GENERAL:  Well appearing NECK:  No jugular venous distention, waveform within normal limits, carotid upstroke brisk and symmetric, no bruits, no thyromegaly LYMPHATICS:  No cervical, inguinal adenopathy LUNGS:  Clear to auscultation bilaterally BACK:  No CVA tenderness CHEST:  Unremarkable HEART:  PMI not displaced or sustained,S1 and S2 within normal limits, no S3, no S4, no clicks, no rubs, no murmurs ABD:  Flat, positive bowel sounds normal in frequency in pitch, no bruits, no rebound, no guarding, no midline pulsatile mass, no hepatomegaly, no splenomegaly EXT:  2 plus pulses throughout, trace edema, no cyanosis no clubbing   EKG:  EKG is ordered today. The ekg ordered today demonstrates sinus rhythm, rate 66, right bundle branch block, no acute ST-T wave changes.   Recent Labs: 04/21/2016: BNP 10.6 08/07/2016: TSH 1.061 08/08/2016: Hemoglobin 15.5; Magnesium 2.0 08/12/2016: ALT 99; BUN 11; Creatinine, Ser 0.84; Potassium 4.0; Sodium 142 08/24/2016: Platelets 322    Lipid Panel    Component Value Date/Time   CHOL 109 06/07/2016 0804   CHOL 107 01/07/2013 0840   TRIG 62 06/07/2016 0804   TRIG 47 01/07/2013 0840   HDL 49 06/07/2016 0804   HDL 53 01/07/2013 0840   LDLCALC 44 01/03/2014 0803   LDLCALC 45 01/07/2013 0840     Lab Results  Component Value Date   HGBA1C 5.2 01/05/2016    Wt Readings from Last 3 Encounters:  09/01/16 261 lb (118.4 kg)  08/24/16 255 lb (115.7 kg)  08/12/16 260 lb (117.9 kg)      Other studies  Reviewed: Additional studies/ records that were reviewed today include: Hospital records. Review of the above records demonstrates:  Please see elsewhere in the note.     ASSESSMENT AND PLAN:  ATRIAL FIB:  Tony Cantu has a CHA2DS2 - VASc score of 3.  However, I don't see any evidence of atrial fibrillation either in the hospital or since. Given this I don't think that there is any indication to continue anticoagulation. I think he had multiform PACs which was misinterpreted his atrial fibrillation. He'll stop his Eliquis. He'll start aspirin 81 mg daily. He will stop the metoprolol.  HTN - he is going to restart valsartan which was controlling him previously.  HLD -   Per Dr. Laurance Flatten  OSA - on CPAP  IDDM -   His A1c most recently was less than 5.  Obesity -   The patient understands the need to lose weight with diet and exercise. We have discussed specific strategies for this.  Carotid bruits  -    He had 1 - 39% bilateral stenosis in October.    Current medicines are reviewed at length with the patient today.  The patient does not have concerns regarding medicines.  The following changes have been made:  As above  Labs/ tests ordered today include:  No orders of the defined types were placed in this encounter.    Disposition:   FU with me in 4 months.      Signed, Minus Breeding, MD  09/01/2016 3:34 PM    [facility]

## 2016-09-01 ENCOUNTER — Ambulatory Visit (INDEPENDENT_AMBULATORY_CARE_PROVIDER_SITE_OTHER): Payer: BLUE CROSS/BLUE SHIELD | Admitting: Cardiology

## 2016-09-01 ENCOUNTER — Encounter: Payer: Self-pay | Admitting: Cardiology

## 2016-09-01 VITALS — BP 114/72 | HR 66 | Ht 70.0 in | Wt 261.0 lb

## 2016-09-01 DIAGNOSIS — I479 Paroxysmal tachycardia, unspecified: Secondary | ICD-10-CM | POA: Diagnosis not present

## 2016-09-01 DIAGNOSIS — I10 Essential (primary) hypertension: Secondary | ICD-10-CM | POA: Diagnosis not present

## 2016-09-01 MED ORDER — VALSARTAN 160 MG PO TABS: 160.0000 mg | ORAL_TABLET | Freq: Every day | ORAL | 3 refills | Status: DC

## 2016-09-01 NOTE — Patient Instructions (Addendum)
Medication Instructions:  STOP- Metoprolol and Eliquis START- Valsartan 160 mg 1 tablets daily and START Aspirin 81 mg daily  Labwork: None Ordered  Testing/Procedures: None Ordered  Follow-Up: Your physician recommends that you schedule a follow-up appointment in: 4 Months in [place]   Any Other Special Instructions Will Be Listed Below (If Applicable).   If you need a refill on your cardiac medications before your next appointment, please call your pharmacy.

## 2016-09-02 ENCOUNTER — Encounter: Payer: Self-pay | Admitting: Cardiology

## 2016-09-05 DIAGNOSIS — E119 Type 2 diabetes mellitus without complications: Secondary | ICD-10-CM | POA: Diagnosis not present

## 2016-09-05 LAB — HM DIABETES EYE EXAM

## 2016-09-07 ENCOUNTER — Encounter: Payer: Self-pay | Admitting: Family Medicine

## 2016-09-07 ENCOUNTER — Other Ambulatory Visit: Payer: Self-pay | Admitting: *Deleted

## 2016-09-07 MED ORDER — FUROSEMIDE 20 MG PO TABS: 30.0000 mg | ORAL_TABLET | Freq: Every day | ORAL | 3 refills | Status: DC

## 2016-09-08 ENCOUNTER — Ambulatory Visit: Payer: Medicare Other | Admitting: Internal Medicine

## 2016-09-23 ENCOUNTER — Other Ambulatory Visit: Payer: Self-pay | Admitting: Neurosurgery

## 2016-09-23 DIAGNOSIS — M48062 Spinal stenosis, lumbar region with neurogenic claudication: Secondary | ICD-10-CM | POA: Diagnosis not present

## 2016-09-23 DIAGNOSIS — M5416 Radiculopathy, lumbar region: Secondary | ICD-10-CM | POA: Diagnosis not present

## 2016-09-23 DIAGNOSIS — M4726 Other spondylosis with radiculopathy, lumbar region: Secondary | ICD-10-CM | POA: Diagnosis not present

## 2016-09-23 DIAGNOSIS — M5126 Other intervertebral disc displacement, lumbar region: Secondary | ICD-10-CM | POA: Diagnosis not present

## 2016-09-23 DIAGNOSIS — M5136 Other intervertebral disc degeneration, lumbar region: Secondary | ICD-10-CM | POA: Diagnosis not present

## 2016-09-26 ENCOUNTER — Emergency Department (HOSPITAL_COMMUNITY)
Admission: EM | Admit: 2016-09-26 | Discharge: 2016-09-26 | Disposition: A | Discharge status: Home / Self Care | Payer: BLUE CROSS/BLUE SHIELD | Attending: Emergency Medicine | Admitting: Emergency Medicine

## 2016-09-26 ENCOUNTER — Encounter (HOSPITAL_COMMUNITY): Payer: Self-pay | Admitting: Emergency Medicine

## 2016-09-26 DIAGNOSIS — Z794 Long term (current) use of insulin: Secondary | ICD-10-CM | POA: Diagnosis not present

## 2016-09-26 DIAGNOSIS — E162 Hypoglycemia, unspecified: Secondary | ICD-10-CM | POA: Diagnosis present

## 2016-09-26 DIAGNOSIS — I2510 Atherosclerotic heart disease of native coronary artery without angina pectoris: Secondary | ICD-10-CM | POA: Insufficient documentation

## 2016-09-26 DIAGNOSIS — Z87891 Personal history of nicotine dependence: Secondary | ICD-10-CM | POA: Diagnosis not present

## 2016-09-26 DIAGNOSIS — I10 Essential (primary) hypertension: Secondary | ICD-10-CM | POA: Insufficient documentation

## 2016-09-26 DIAGNOSIS — J45909 Unspecified asthma, uncomplicated: Secondary | ICD-10-CM | POA: Insufficient documentation

## 2016-09-26 DIAGNOSIS — R569 Unspecified convulsions: Secondary | ICD-10-CM | POA: Diagnosis not present

## 2016-09-26 DIAGNOSIS — Z7982 Long term (current) use of aspirin: Secondary | ICD-10-CM | POA: Insufficient documentation

## 2016-09-26 DIAGNOSIS — R402431 Glasgow coma scale score 3-8, in the field [EMT or ambulance]: Secondary | ICD-10-CM | POA: Diagnosis not present

## 2016-09-26 DIAGNOSIS — E11649 Type 2 diabetes mellitus with hypoglycemia without coma: Secondary | ICD-10-CM | POA: Diagnosis not present

## 2016-09-26 DIAGNOSIS — R404 Transient alteration of awareness: Secondary | ICD-10-CM | POA: Diagnosis not present

## 2016-09-26 DIAGNOSIS — I482 Chronic atrial fibrillation: Secondary | ICD-10-CM | POA: Diagnosis not present

## 2016-09-26 HISTORY — DX: Unspecified convulsions: R56.9

## 2016-09-26 LAB — CBG MONITORING, ED
Glucose-Capillary: 103 mg/dL — ABNORMAL HIGH (ref 65–99)
Glucose-Capillary: 43 mg/dL — CL (ref 65–99)
Glucose-Capillary: 129 mg/dL — ABNORMAL HIGH (ref 65–99)

## 2016-09-26 MED ORDER — DEXTROSE 50 % IV SOLN
1.0000 | Freq: Once | INTRAVENOUS | Status: AC
  Administered 2016-09-26: 50 mL via INTRAVENOUS
  Filled 2016-09-26: qty 50

## 2016-09-26 NOTE — ED Notes (Signed)
Per Dr. Lacinda Axon, patient given graham crackers, peanut butter, and orange juice.

## 2016-09-26 NOTE — ED Notes (Signed)
Bed: [facility]44 Expected date:  Expected time:  Means of arrival:  Comments: [facility]

## 2016-09-26 NOTE — Discharge Instructions (Signed)
Check your sugars frequently. Make sure you eat if you take extra insulin

## 2016-09-26 NOTE — ED Triage Notes (Signed)
Pt from home via EMS with complaints of hypoglycemia and a seizure. Pt is alert and oriented x 4. Pt states he has seizures when he gets hypoglycemic. Pt had a seizure that lasted about 2 min. Pt states he felt it coming on prior to LOC and he drank juice and his wife put peanut butter in his mouth.

## 2016-09-26 NOTE — ED Notes (Signed)
ED Provider at bedside. 

## 2016-09-26 NOTE — ED Provider Notes (Signed)
Tony Cantu   CSN: [id number] Arrival date & time: 09/26/16  1922     History   Chief Complaint Chief Complaint  Patient presents with  . Seizures  . Hypoglycemia    HPI Tony Cantu is a 66 y.o. male.  Episode of hypoglycemia just prior to presentation. Patient allegedly had a seizure associated with this episode. His wife who is an Therapist, sports confirms this history. Patient and wife state that he took an excessive amount of insulin at dinner and did not eat enough calories. He is now back to normal. Review systems negative for chest pain, dyspnea, fever, sweats, chills.      Past Medical History:  Diagnosis Date  . Arthritis   . Asthma   . BPH (benign prostatic hypertrophy)   . Colon polyps   . Depression    no meds  . Diabetes mellitus without complication ([place])   . Gastritis   . GERD (gastroesophageal reflux disease)   . History of hiatal hernia   . Hypertension   . Seizures ([place])   . Sleep apnea    uses CPAP nightly  . Testicle trouble    one testicle    Patient Active Problem List   Diagnosis Date Noted  . Acute renal injury ([place]) 08/06/2016  . Diarrhea 08/06/2016  . Fever 08/06/2016  . Hyperbilirubinemia 08/06/2016  . Septic shock ([place]) 08/06/2016  . Lactic acidosis 08/06/2016  . Type 2 diabetes mellitus with hyperlipidemia ([place]) 01/18/2016  . Inguinal hernia 12/10/2015  . Right groin pain 11/30/2015  . Thrombocytopenia ([place]) 07/21/2014  . Bilateral carotid bruits 05/11/2014  . Vitamin D deficiency 09/17/2013  . BPH (benign prostatic hyperplasia) 05/22/2013  . 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 Diabetes type 2, controlled ([place]) 01/10/2013  . Essential hypertension, benign 01/10/2013  . OSA (obstructive sleep apnea) 05/16/2012  . Edema 02/21/2012  . At risk for coronary artery disease 03/20/2011  . Obesity 03/20/2011  . WEIGHT GAIN, ABNORMAL 11/09/2010  . Hyperlipidemia 06/22/2010  . ALLERGIC RHINITIS 06/22/2010  .  ASTHMA 06/22/2010  . COUGH 06/22/2010    Past Surgical History:  Procedure Laterality Date  . BACK SURGERY    . CARPAL TUNNEL RELEASE Right 10/13/2015   Procedure: RIGHT CARPAL TUNNEL RELEASE;  Surgeon: Daryll Brod, MD;  Location: [facility];  Service: Orthopedics;  Laterality: Right;  . CARPAL TUNNEL RELEASE Left 07/05/2016   Procedure: LEFT CARPAL TUNNEL RELEASE;  Surgeon: Daryll Brod, MD;  Location: [facility];  Service: Orthopedics;  Laterality: Left;  . HERNIA REPAIR    . INGUINAL HERNIA REPAIR  2003   right   . KNEE ARTHROSCOPY Right 03/03/2016  . LUMBAR DISC SURGERY  8/96   Dr. Coralyn Mark, discectomy  . SHOULDER SURGERY  11/28/05   left partial  . SHOULDER SURGERY  07/14/2006   RIGHT  . TONSILECTOMY, ADENOIDECTOMY, BILATERAL MYRINGOTOMY AND TUBES         Home Medications    Prior to Admission medications   Medication Sig Start Date End Date Taking? Authorizing Provider  aspirin EC 81 MG tablet Take 81 mg by mouth daily with breakfast.    Yes Historical Provider, MD  atorvastatin (LIPITOR) 40 MG tablet Take 1 tablet (40 mg total) by mouth daily. 10/26/15  Yes Chipper Herb, MD  b complex vitamins tablet Take 1 tablet by mouth daily at 12 noon.    Yes Historical Provider, MD  B-D ULTRA-FINE 33 LANCETS MISC Check Blood sugar  8 times daily and PRN.DX E11.9 01/19/15  Yes Chipper Herb, MD  Calcium Carbonate (CALTRATE 600 PO) Take 1 tablet by mouth daily at 12 noon.    Yes Historical Provider, MD  Cholecalciferol (VITAMIN D3) 2000 UNITS capsule Take 2,000 Units by mouth daily at 12 noon.    Yes Historical Provider, MD  EPIPEN 2-PAK 0.3 MG/0.3ML SOAJ injection Inject 0.3 mg into the skin as needed (for allergic reaction).  01/04/15  Yes Historical Provider, MD  fexofenadine (ALLEGRA) 180 MG tablet Take 180 mg by mouth daily with breakfast.    Yes Historical Provider, MD  Flaxseed, Linseed, (FLAX SEED OIL PO) Take 1 capsule by mouth daily with breakfast.     Yes Historical Provider, MD  furosemide (LASIX) 20 MG tablet Take 1.5 tablets (30 mg total) by mouth daily. 09/07/16  Yes Chipper Herb, MD  Glucosamine-Chondroit-Vit C-Mn (GLUCOSAMINE 1500 COMPLEX) CAPS Take 2 capsules by mouth daily at 12 noon.    Yes Historical Provider, MD  glucose blood (ONE TOUCH ULTRA TEST) test strip chesk BS q 2 hours PRN.dx.E11.9 01/19/15  Yes Chipper Herb, MD  HYDROcodone-acetaminophen (NORCO) 5-325 MG tablet Take 1 tablet by mouth every 6 (six) hours as needed for moderate pain. 07/05/16  Yes Daryll Brod, MD  insulin glargine (LANTUS) 100 UNIT/ML injection Inject 0.75 mLs (75 Units total) into the skin 2 (two) times daily. As directed Patient taking differently: Inject 50-75 Units into the skin 2 (two) times daily. Inject 50 units in the morning and 75 units at night 10/26/15  Yes Chipper Herb, MD  insulin lispro (HUMALOG) 100 UNIT/ML injection Inject 0.2-0.5 mLs (20-50 Units total) into the skin 3 (three) times daily before meals. Sliding scale Patient taking differently: Inject 2-32 Units into the skin 3 (three) times daily before meals. Sliding scale 10/26/15  Yes Chipper Herb, MD  Insulin Syringe-Needle U-100 (BD INSULIN SYRINGE ULTRAFINE) 31G X 5/16" 0.5 ML MISC USE TO INJECT 8 TO 10 TIMES DAILY, dx E11.9 01/19/15  Yes Chipper Herb, MD  montelukast (SINGULAIR) 10 MG tablet Take 1 tablet (10 mg total) by mouth at bedtime. 10/26/15  Yes Chipper Herb, MD  MULTIPLE VITAMIN PO Take 1 tablet by mouth daily at 12 noon.    Yes Historical Provider, MD  multivitamin-lutein (OCUVITE-LUTEIN) CAPS capsule Take 1 capsule by mouth daily at 12 noon.   Yes Historical Provider, MD  pantoprazole (PROTONIX) 40 MG tablet Take 1 tablet (40 mg total) by mouth daily. 10/26/15  Yes Chipper Herb, MD  ranitidine (ZANTAC) 75 MG tablet Take 75 mg by mouth daily with breakfast.    Yes Historical Provider, MD  Testosterone (AXIRON) 30 MG/ACT SOLN Place 1 application onto the skin daily. Place 1  application under each axilla daily 07/17/15  Yes Chipper Herb, MD  traMADol (ULTRAM) 50 MG tablet Take 1 tablet (50 mg total) by mouth every 6 (six) hours as needed. Patient taking differently: Take 25-50 mg by mouth every 6 (six) hours as needed for moderate pain (and headaches).  07/17/15  Yes Chipper Herb, MD  valsartan (DIOVAN) 160 MG tablet Take 1 tablet (160 mg total) by mouth daily. Patient taking differently: Take 160 mg by mouth at bedtime.  09/01/16  Yes Minus Breeding, MD    Family History Family History  Problem Relation Age of Onset  . Heart failure Mother   . Heart attack Mother     in her 60's  . Alzheimer's disease  Mother   . Diabetes Mother   . Asthma Father   . Suicidality Father     in his 26's  . Allergic rhinitis Sister   . Heart failure Maternal Grandmother   . Rheum arthritis Maternal Grandmother   . Breast cancer Maternal Grandmother   . Heart attack Maternal Grandmother     in her 32's    Social History Social History  Substance Use Topics  . Smoking status: Former Smoker    Years: 34.00    Types: Cigarettes, Pipe    Quit date: 08/29/2001  . Smokeless tobacco: Never Used     Comment: quit 2005 smoked cigarettes for 5 yrs prior to pipe use  . Alcohol use No     Comment: once a year     Allergies   Erythromycin and Sulfonamide derivatives   Review of Systems Review of Systems  All other systems reviewed and are negative.    Physical Exam Updated Vital Signs BP 125/68   Pulse 95   Temp 98.7 F (37.1 C) (Oral)   Resp 15   Ht 5' 10"  (1.778 m)   Wt 260 lb (117.9 kg)   SpO2 92%   BMI 37.31 kg/m   Physical Exam  Constitutional: He is oriented to person, place, and time. He appears well-developed and well-nourished.  HENT:  Head: Normocephalic and atraumatic.  Eyes: Conjunctivae are normal.  Neck: Neck supple.  Cardiovascular: Normal rate and regular rhythm.   Pulmonary/Chest: Effort normal and breath sounds normal.  Abdominal:  Soft. Bowel sounds are normal.  Musculoskeletal: Normal range of motion.  Neurological: He is alert and oriented to person, place, and time.  Skin: Skin is warm and dry.  Psychiatric: He has a normal mood and affect. His behavior is normal.  Nursing Cantu and vitals reviewed.    ED Treatments / Results  Labs (all labs ordered are listed, but only abnormal results are displayed) Labs Reviewed  CBG MONITORING, ED - Abnormal; Notable for the following:       Result Value   Glucose-Capillary 43 (*)    All other components within normal limits  CBG MONITORING, ED - Abnormal; Notable for the following:    Glucose-Capillary 103 (*)    All other components within normal limits  CBG MONITORING, ED - Abnormal; Notable for the following:    Glucose-Capillary 129 (*)    All other components within normal limits    EKG  EKG Interpretation None       Radiology No results found.  Procedures Procedures (including critical care time)  Medications Ordered in ED Medications  dextrose 50 % solution 50 mL (50 mLs Intravenous Given 09/26/16 1950)     Initial Impression / Assessment and Plan / ED Course  I have reviewed the triage vital signs and the nursing notes.  Pertinent labs & imaging results that were available during my care of the patient were reviewed by me and considered in my medical decision making (see chart for details).    Patient had a normal physical exam. His glucose has remained stable. Discussed scenario with the patient and his wife.   Final Clinical Impressions(s) / ED Diagnoses   Final diagnoses:  Hypoglycemia    New Prescriptions Discharge Medication List as of 09/26/2016  9:45 PM       Nat Christen, MD 09/26/16 2221

## 2016-10-03 ENCOUNTER — Encounter: Payer: Self-pay | Admitting: Family Medicine

## 2016-10-04 ENCOUNTER — Other Ambulatory Visit: Payer: Self-pay | Admitting: *Deleted

## 2016-10-04 ENCOUNTER — Encounter (HOSPITAL_COMMUNITY)
Admission: RE | Admit: 2016-10-04 | Discharge: 2016-10-04 | Disposition: A | Discharge status: Home / Self Care | Payer: BLUE CROSS/BLUE SHIELD | Source: Ambulatory Visit | Attending: Neurosurgery | Admitting: Neurosurgery

## 2016-10-04 ENCOUNTER — Encounter (HOSPITAL_COMMUNITY): Payer: Self-pay

## 2016-10-04 DIAGNOSIS — M5126 Other intervertebral disc displacement, lumbar region: Secondary | ICD-10-CM | POA: Insufficient documentation

## 2016-10-04 DIAGNOSIS — Z01812 Encounter for preprocedural laboratory examination: Secondary | ICD-10-CM | POA: Diagnosis not present

## 2016-10-04 HISTORY — DX: Headache, unspecified: R51.9

## 2016-10-04 HISTORY — DX: Other specified postprocedural states: R11.2

## 2016-10-04 HISTORY — DX: Nausea with vomiting, unspecified: Z98.890

## 2016-10-04 HISTORY — DX: Headache: R51

## 2016-10-04 LAB — BASIC METABOLIC PANEL
Anion gap: 9 (ref 5–15)
BUN: 8 mg/dL (ref 6–20)
CO2: 25 mmol/L (ref 22–32)
Calcium: 9.6 mg/dL (ref 8.9–10.3)
Chloride: 106 mmol/L (ref 101–111)
Creatinine, Ser: 0.85 mg/dL (ref 0.61–1.24)
GFR calc Af Amer: >60 mL/min (ref >60)
GFR calc non Af Amer: >60 mL/min (ref >60)
Glucose, Bld: 58 mg/dL — ABNORMAL LOW (ref 65–99)
Potassium: 4 mmol/L (ref 3.5–5.1)
Sodium: 140 mmol/L (ref 135–145)

## 2016-10-04 LAB — GLUCOSE, CAPILLARY: Glucose-Capillary: 133 mg/dL — ABNORMAL HIGH (ref 65–99)

## 2016-10-04 LAB — CBC
HCT: 49.1 % (ref 39.0–52.0)
Hemoglobin: 16.2 g/dL (ref 13.0–17.0)
MCH: 32.5 pg (ref 26.0–34.0)
MCHC: 33 g/dL (ref 30.0–36.0)
MCV: 98.6 fL (ref 78.0–100.0)
Platelets: 149 10*3/uL — ABNORMAL LOW (ref 150–400)
RBC: 4.98 MIL/uL (ref 4.22–5.81)
RDW: 14.4 % (ref 11.5–15.5)
WBC: 8 10*3/uL (ref 4.0–10.5)

## 2016-10-04 LAB — SURGICAL PCR SCREEN
MRSA, PCR: NEGATIVE
Staphylococcus aureus: NEGATIVE

## 2016-10-04 MED ORDER — GLUCAGON (RDNA) 1 MG IJ KIT: 1.0000 mg | PACK | Freq: Once | INTRAMUSCULAR | 3 refills | Status: DC | PRN

## 2016-10-04 NOTE — Progress Notes (Signed)
CARDIOLOGIST DR. Mayking   PCP- DR. [place]

## 2016-10-04 NOTE — Progress Notes (Signed)
Ordered and email sent to pt

## 2016-10-04 NOTE — Pre-Procedure Instructions (Signed)
[facility]  10/04/2016      [place], [place] - [address] [place] [address] Phone: [phone] Fax: [place], [place] [address] Phone: [phone] Fax: [place] [address], [place] - [place] [address] [place] [address] Phone: [phone] Fax: [phone]  [place], [place] [address] Phone: [phone] Fax: [phone]    Your procedure is scheduled on   Thursday  10/06/16  Report to [facility] at 800 A.M.  Call this number if you have problems the morning of surgery:  [phone]   Remember:  Do not eat food or drink liquids after midnight.  Take these medicines the morning of surgery with A SIP OF WATER  -  HYDROCODONE OR ULTRAM  IF NEEDED, PANTOPRAZOLE, RANITIDINE  (STOP NOW ASPIRIN, IBUPROFEN/ ADVIL/ MOTRIN/ GOODY POWDERS, BC'S, HERBAL MEDICINES, VITAMINS)    How to Manage Your Diabetes Before and After Surgery  Why is it important to control my blood sugar before and after surgery? . Improving blood sugar levels before and after surgery helps healing and can limit problems. . A way of improving blood sugar control is eating a healthy diet by: o  Eating less sugar and carbohydrates o  Increasing activity/exercise o  Talking with your doctor about reaching your blood sugar goals . High blood sugars (greater than 180 mg/dL) can raise your risk of infections and slow your recovery, so you will need to focus on controlling your diabetes during the weeks before surgery. . Make sure that the doctor who takes care of your diabetes knows about your planned surgery including the date and location.  How do I manage my blood sugar before surgery? . Check your blood sugar at least 4 times a  day, starting 2 days before surgery, to make sure that the level is not too high or low. o Check your blood sugar the morning of your surgery when you wake up and every 2 hours until you get to the Short Stay unit. . If your blood sugar is less than 70 mg/dL, you will need to treat for low blood sugar: o Do not take insulin. o Treat a low blood sugar (less than 70 mg/dL) with  cup of clear juice (cranberry or apple), 4 glucose tablets, OR glucose gel. o Recheck blood sugar in 15 minutes after treatment (to make sure it is greater than 70 mg/dL). If your blood sugar is not greater than 70 mg/dL on recheck, call [phone] for further instructions. . Report your blood sugar to the short stay nurse when you get to Short Stay.  . If you are admitted to the hospital after surgery: o Your blood sugar will be checked by the staff and you will probably be given insulin after surgery (instead of oral diabetes medicines) to make sure you have good blood sugar levels. o The goal for blood sugar control after surgery is 80-180 mg/dL.              WHAT DO I DO ABOUT MY DIABETES MEDICATION?   Marland Kitchen Do not take oral diabetes medicines (pills) the morning of surgery.  . THE NIGHT BEFORE SURGERY, take ____60 UNITS_______ units of __LANTUS_________insulin.       Marland Kitchen HE MORNING OF  SURGERY, take ________40____ units of ______LANTUS____insulin.  . The day of surgery, do not take other diabetes injectables, including Byetta (exenatide), Bydureon (exenatide ER), Victoza (liraglutide), or Trulicity (dulaglutide).  . If your CBG is greater than 220 mg/dL, you may take  of your sliding scale (correction) dose of insulin.  Other Instructions:          Patient Signature:  Date:   Nurse Signature:  Date:   Reviewed and Endorsed by [facility] Patient Education Committee, August 2015  Do not wear jewelry, make-up or nail polish.  Do not wear lotions, powders, or perfumes, or deoderant.  Do not  shave 48 hours prior to surgery.  Men may shave face and neck.  Do not bring valuables to the hospital.  [facility] is not responsible for any belongings or valuables.  Contacts, dentures or bridgework may not be worn into surgery.  Leave your suitcase in the car.  After surgery it may be brought to your room.  For patients admitted to the hospital, discharge time will be determined by your treatment team.  Patients discharged the day of surgery will not be allowed to drive home.   Name and phone number of your driver:    Special instructions:  [place] - Preparing for Surgery  Before surgery, you can play an important role.  Because skin is not sterile, your skin needs to be as free of germs as possible.  You can reduce the number of germs on you skin by washing with CHG (chlorahexidine gluconate) soap before surgery.  CHG is an antiseptic cleaner which kills germs and bonds with the skin to continue killing germs even after washing.  Please DO NOT use if you have an allergy to CHG or antibacterial soaps.  If your skin becomes reddened/irritated stop using the CHG and inform your nurse when you arrive at Short Stay.  Do not shave (including legs and underarms) for at least 48 hours prior to the first CHG shower.  You may shave your face.  Please follow these instructions carefully:   1.  Shower with CHG Soap the night before surgery and the                                morning of Surgery.  2.  If you choose to wash your hair, wash your hair first as usual with your       normal shampoo.  3.  After you shampoo, rinse your hair and body thoroughly to remove the                      Shampoo.  4.  Use CHG as you would any other liquid soap.  You can apply chg directly       to the skin and wash gently with scrungie or a clean washcloth.  5.  Apply the CHG Soap to your body ONLY FROM THE NECK DOWN.        Do not use on open wounds or open sores.  Avoid contact with your eyes,       ears,  mouth and genitals (private parts).  Wash genitals (private parts)       with your normal soap.  6.  Wash thoroughly, paying special attention to the area where your surgery        will be performed.  7.  Thoroughly rinse your body with warm water from  the neck down.  8.  DO NOT shower/wash with your normal soap after using and rinsing off       the CHG Soap.  9.  Pat yourself dry with a clean towel.            10.  Wear clean pajamas.            11.  Place clean sheets on your bed the night of your first shower and do not        sleep with pets.  Day of Surgery  Do not apply any lotions/deoderants the morning of surgery.  Please wear clean clothes to the hospital/surgery center.    Please read over the following fact sheets that you were given. MRSA Information and Surgical Site Infection Prevention

## 2016-10-05 LAB — HEMOGLOBIN A1C
Hgb A1c MFr Bld: 5 % (ref 4.8–5.6)
Mean Plasma Glucose: 97 mg/dL

## 2016-10-05 MED ORDER — DEXTROSE 5 % IV SOLN
3.0000 g | INTRAVENOUS | Status: AC
  Administered 2016-10-06: 3 g via INTRAVENOUS
  Filled 2016-10-05 (×2): qty 3000

## 2016-10-05 NOTE — Anesthesia Preprocedure Evaluation (Addendum)
Anesthesia Evaluation  Patient identified by MRN, date of birth, ID band Patient awake    Reviewed: Allergy & Precautions, NPO status , Patient's Chart, lab work & pertinent test results  History of Anesthesia Complications (+) PONV and history of anesthetic complications  Airway Mallampati: II  TM Distance: >3 FB Neck ROM: Full    Dental no notable dental hx. (+) Dental Advisory Given   Pulmonary asthma , former smoker,    Pulmonary exam normal        Cardiovascular hypertension, Pt. on medications Normal cardiovascular exam  Study Conclusions  - Left ventricle: The cavity size was moderately dilated. Mom   thickness was normal. Systolic function was normal. The estimated   ejection fraction was in the range of 60% to 65%. Images were   inadequate for LV Filippini motion assessment. Diastolic dysfunction,   indeterminate grade. Indeterminate filling pressures. - Aortic valve: Moderately calcified leaflets. There was no   stenosis. There was mild regurgitation. Peak velocity (S): 214   cm/s. Mean gradient (S): 9 mm Hg. Valve area (VTI): 2.29 cm^2.   Valve area (Vmax): 2.42 cm^2. - Left atrium: The atrium was mildly to moderately dilated. - Pulmonic valve: There was mild regurgitation.   Neuro/Psych Seizures -,     GI/Hepatic Neg liver ROS, GERD  ,  Endo/Other  negative endocrine ROSdiabetesMorbid obesity  Renal/GU negative Renal ROS     Musculoskeletal negative musculoskeletal ROS (+)   Abdominal   Peds  Hematology negative hematology ROS (+)   Anesthesia Other Findings Day of surgery medications reviewed with the patient.  Reproductive/Obstetrics                            Anesthesia Physical Anesthesia Plan  ASA: III  Anesthesia Plan: General   Post-op Pain Management:    Induction: Intravenous  Airway Management Planned: Oral ETT  Additional Equipment:   Intra-op Plan:    Post-operative Plan: Extubation in OR  Informed Consent: I have reviewed the patients History and Physical, chart, labs and discussed the procedure including the risks, benefits and alternatives for the proposed anesthesia with the patient or authorized representative who has indicated his/her understanding and acceptance.   Dental advisory given  Plan Discussed with: CRNA, Anesthesiologist and Surgeon  Anesthesia Plan Comments:        Anesthesia Quick Evaluation

## 2016-10-05 NOTE — Progress Notes (Signed)
Anesthesia Chart Review:  Pt is a 66 year old male scheduled for right L3-4 lumbar laminectomy, foraminotomy, and microdiscectomy on 10/06/2016 with Jovita Gamma, MD.   - PCP is Rosana Berger, MD, last office visit 08/24/16 - Cardiologist is Minus Breeding, MD, last office visit 09/01/16.   PMH includes:  HTN, DM, asthma, OSA, seizures (due to hypoglycemia 09/26/16), post-op N/V, GERD. Former smoker. BMI 38. S/p B carpal tunnel release 2017.   Pt hospitalized 12/9-12/13/17 for septic shock due to campylobacter. Hospitalist felt pt in new onset afib during hospitalization, but Dr. Percival Spanish reviewed rhythm strips and EKGs from this hospitalization on 09/01/16 and did not see evidence of afib.   Medications include: ASA, lipitor, lasix, lantus, humalog, protonix, zantac, valsartan  Preoperative labs reviewed.  HbA1c 5.0, glucose 58.   1 view CXR 08/06/16: No active disease.  EKG 09/01/16: NSR. RBBB.   Echo 08/09/16:  - Left ventricle: The cavity size was normal. Gamm thickness was normal. Systolic function was normal. The estimated ejection fraction was in the range of 60% to 65%. Tipping motion was normal; there were no regional Camerer motion abnormalities. Left ventricular diastolic function parameters were normal. - Aortic valve: Moderately calcified annulus. Trileaflet; moderately thickened leaflets. There was very mild stenosis. Valve area (VTI): 2.15 cm^2. Valve area (Vmax): 1.97 cm^2. - Mitral valve: Mildly calcified annulus. Mildly thickened leaflets. - Pulmonary arteries: Systolic pressure was mildly increased. PA peak pressure: 33 mm Hg (S). - Technically difficult study.  Carotid duplex 05/31/16:  - Stable carotid artery disease, with 1-39% bilateral ICA stenosis. - Patent vertebral arteries with antegrade flow. - Normal subclavian arteries, bilaterally.  If no changes, I anticipate pt can proceed with surgery as scheduled.   Willeen Cass, FNP-BC [facility] Short Stay Surgical  Center/Anesthesiology Phone: [phone] 10/05/2016 9:14 AM

## 2016-10-06 ENCOUNTER — Ambulatory Visit (HOSPITAL_COMMUNITY): Payer: BLUE CROSS/BLUE SHIELD | Admitting: Emergency Medicine

## 2016-10-06 ENCOUNTER — Ambulatory Visit (HOSPITAL_COMMUNITY): Payer: BLUE CROSS/BLUE SHIELD | Admitting: Anesthesiology

## 2016-10-06 ENCOUNTER — Encounter (HOSPITAL_COMMUNITY): Payer: Self-pay | Admitting: Urology

## 2016-10-06 ENCOUNTER — Ambulatory Visit (HOSPITAL_COMMUNITY): Payer: BLUE CROSS/BLUE SHIELD

## 2016-10-06 ENCOUNTER — Inpatient Hospital Stay (HOSPITAL_COMMUNITY)
Admission: RE | Admit: 2016-10-06 | Discharge: 2016-10-08 | DRG: 520 | Disposition: A | Discharge status: Home / Self Care | Payer: BLUE CROSS/BLUE SHIELD | Source: Ambulatory Visit | Attending: Neurosurgery | Admitting: Neurosurgery

## 2016-10-06 ENCOUNTER — Encounter (HOSPITAL_COMMUNITY)
Admission: RE | Disposition: A | Discharge status: Home / Self Care | Payer: Self-pay | Source: Ambulatory Visit | Attending: Neurosurgery

## 2016-10-06 DIAGNOSIS — Z794 Long term (current) use of insulin: Secondary | ICD-10-CM | POA: Diagnosis not present

## 2016-10-06 DIAGNOSIS — M4726 Other spondylosis with radiculopathy, lumbar region: Secondary | ICD-10-CM | POA: Diagnosis not present

## 2016-10-06 DIAGNOSIS — E669 Obesity, unspecified: Secondary | ICD-10-CM | POA: Diagnosis present

## 2016-10-06 DIAGNOSIS — R569 Unspecified convulsions: Secondary | ICD-10-CM | POA: Diagnosis present

## 2016-10-06 DIAGNOSIS — M5126 Other intervertebral disc displacement, lumbar region: Secondary | ICD-10-CM | POA: Diagnosis present

## 2016-10-06 DIAGNOSIS — Z87891 Personal history of nicotine dependence: Secondary | ICD-10-CM | POA: Diagnosis not present

## 2016-10-06 DIAGNOSIS — M48061 Spinal stenosis, lumbar region without neurogenic claudication: Secondary | ICD-10-CM | POA: Diagnosis not present

## 2016-10-06 DIAGNOSIS — Z7982 Long term (current) use of aspirin: Secondary | ICD-10-CM | POA: Diagnosis not present

## 2016-10-06 DIAGNOSIS — M5136 Other intervertebral disc degeneration, lumbar region: Secondary | ICD-10-CM | POA: Diagnosis not present

## 2016-10-06 DIAGNOSIS — Z86010 Personal history of colonic polyps: Secondary | ICD-10-CM | POA: Diagnosis not present

## 2016-10-06 DIAGNOSIS — Z419 Encounter for procedure for purposes other than remedying health state, unspecified: Secondary | ICD-10-CM

## 2016-10-06 DIAGNOSIS — E119 Type 2 diabetes mellitus without complications: Secondary | ICD-10-CM | POA: Diagnosis not present

## 2016-10-06 DIAGNOSIS — Z825 Family history of asthma and other chronic lower respiratory diseases: Secondary | ICD-10-CM | POA: Diagnosis not present

## 2016-10-06 DIAGNOSIS — Z8249 Family history of ischemic heart disease and other diseases of the circulatory system: Secondary | ICD-10-CM | POA: Diagnosis not present

## 2016-10-06 DIAGNOSIS — Z888 Allergy status to other drugs, medicaments and biological substances status: Secondary | ICD-10-CM

## 2016-10-06 DIAGNOSIS — M47816 Spondylosis without myelopathy or radiculopathy, lumbar region: Secondary | ICD-10-CM | POA: Diagnosis present

## 2016-10-06 DIAGNOSIS — Z882 Allergy status to sulfonamides status: Secondary | ICD-10-CM

## 2016-10-06 DIAGNOSIS — J45909 Unspecified asthma, uncomplicated: Secondary | ICD-10-CM | POA: Diagnosis not present

## 2016-10-06 DIAGNOSIS — Z79899 Other long term (current) drug therapy: Secondary | ICD-10-CM | POA: Diagnosis not present

## 2016-10-06 DIAGNOSIS — Z6837 Body mass index (BMI) 37.0-37.9, adult: Secondary | ICD-10-CM | POA: Diagnosis not present

## 2016-10-06 DIAGNOSIS — E785 Hyperlipidemia, unspecified: Secondary | ICD-10-CM | POA: Diagnosis not present

## 2016-10-06 DIAGNOSIS — K219 Gastro-esophageal reflux disease without esophagitis: Secondary | ICD-10-CM | POA: Diagnosis present

## 2016-10-06 DIAGNOSIS — I10 Essential (primary) hypertension: Secondary | ICD-10-CM | POA: Diagnosis present

## 2016-10-06 DIAGNOSIS — G4733 Obstructive sleep apnea (adult) (pediatric): Secondary | ICD-10-CM | POA: Diagnosis not present

## 2016-10-06 HISTORY — PX: LUMBAR LAMINECTOMY/DECOMPRESSION MICRODISCECTOMY: SHX5026

## 2016-10-06 LAB — GLUCOSE, CAPILLARY
Glucose-Capillary: 108 mg/dL — ABNORMAL HIGH (ref 65–99)
Glucose-Capillary: 126 mg/dL — ABNORMAL HIGH (ref 65–99)
Glucose-Capillary: 137 mg/dL — ABNORMAL HIGH (ref 65–99)
Glucose-Capillary: 162 mg/dL — ABNORMAL HIGH (ref 65–99)
Glucose-Capillary: 206 mg/dL — ABNORMAL HIGH (ref 65–99)
Glucose-Capillary: 56 mg/dL — ABNORMAL LOW (ref 65–99)
Glucose-Capillary: 57 mg/dL — ABNORMAL LOW (ref 65–99)
Glucose-Capillary: 59 mg/dL — ABNORMAL LOW (ref 65–99)
Glucose-Capillary: 70 mg/dL (ref 65–99)
Glucose-Capillary: 94 mg/dL (ref 65–99)

## 2016-10-06 SURGERY — LUMBAR LAMINECTOMY/DECOMPRESSION MICRODISCECTOMY 1 LEVEL
Anesthesia: General | Laterality: Right

## 2016-10-06 MED ORDER — FENTANYL CITRATE (PF) 100 MCG/2ML IJ SOLN
INTRAMUSCULAR | Status: AC
  Filled 2016-10-06: qty 4

## 2016-10-06 MED ORDER — PHENYLEPHRINE HCL 10 MG/ML IJ SOLN
INTRAMUSCULAR | Status: DC | PRN
  Administered 2016-10-06: 160 ug via INTRAVENOUS
  Administered 2016-10-06 (×2): 120 ug via INTRAVENOUS

## 2016-10-06 MED ORDER — ROCURONIUM BROMIDE 50 MG/5ML IV SOSY
PREFILLED_SYRINGE | INTRAVENOUS | Status: AC
  Filled 2016-10-06: qty 10

## 2016-10-06 MED ORDER — MORPHINE SULFATE (PF) 4 MG/ML IV SOLN: 4.0000 mg | INTRAVENOUS | Status: DC | PRN

## 2016-10-06 MED ORDER — KETOROLAC TROMETHAMINE 30 MG/ML IJ SOLN
INTRAMUSCULAR | Status: AC
  Filled 2016-10-06: qty 1

## 2016-10-06 MED ORDER — INSULIN GLARGINE 100 UNIT/ML ~~LOC~~ SOLN
75.0000 [IU] | Freq: Every day | SUBCUTANEOUS | Status: DC
  Administered 2016-10-07: 75 [IU] via SUBCUTANEOUS
  Filled 2016-10-06 (×2): qty 0.75

## 2016-10-06 MED ORDER — KETOROLAC TROMETHAMINE 30 MG/ML IJ SOLN
30.0000 mg | Freq: Once | INTRAMUSCULAR | Status: AC
  Administered 2016-10-06: 30 mg via INTRAVENOUS

## 2016-10-06 MED ORDER — PROPOFOL 10 MG/ML IV BOLUS
INTRAVENOUS | Status: DC | PRN
Start: 2016-10-06 — End: 2016-10-06
  Administered 2016-10-06: 20 mg via INTRAVENOUS
  Administered 2016-10-06: 200 mg via INTRAVENOUS

## 2016-10-06 MED ORDER — SCOPOLAMINE 1 MG/3DAYS TD PT72
MEDICATED_PATCH | TRANSDERMAL | Status: AC
  Administered 2016-10-06: 1.5 mg via TRANSDERMAL
  Filled 2016-10-06: qty 1

## 2016-10-06 MED ORDER — PHENOL 1.4 % MT LIQD
1.0000 | OROMUCOSAL | Status: DC | PRN
  Filled 2016-10-06: qty 177

## 2016-10-06 MED ORDER — IRBESARTAN 150 MG PO TABS
150.0000 mg | ORAL_TABLET | Freq: Every day | ORAL | Status: DC
  Administered 2016-10-06 – 2016-10-07 (×2): 150 mg via ORAL
  Filled 2016-10-06 (×4): qty 1

## 2016-10-06 MED ORDER — LIDOCAINE-EPINEPHRINE (PF) 2 %-1:200000 IJ SOLN
INTRAMUSCULAR | Status: AC
  Filled 2016-10-06: qty 20

## 2016-10-06 MED ORDER — BISACODYL 10 MG RE SUPP: 10.0000 mg | Freq: Every day | RECTAL | Status: DC | PRN

## 2016-10-06 MED ORDER — ALBUMIN HUMAN 5 % IV SOLN
12.5000 g | Freq: Once | INTRAVENOUS | Status: AC
  Administered 2016-10-06: 12.5 g via INTRAVENOUS

## 2016-10-06 MED ORDER — NEOSTIGMINE METHYLSULFATE 5 MG/5ML IV SOSY
PREFILLED_SYRINGE | INTRAVENOUS | Status: DC | PRN
  Administered 2016-10-06: 4 mg via INTRAVENOUS

## 2016-10-06 MED ORDER — SODIUM CHLORIDE 0.9% FLUSH
3.0000 mL | Freq: Two times a day (BID) | INTRAVENOUS | Status: DC
  Administered 2016-10-07: 3 mL via INTRAVENOUS

## 2016-10-06 MED ORDER — ONDANSETRON HCL 4 MG PO TABS: 4.0000 mg | ORAL_TABLET | Freq: Four times a day (QID) | ORAL | Status: DC | PRN

## 2016-10-06 MED ORDER — HYDROMORPHONE HCL 1 MG/ML IJ SOLN
INTRAMUSCULAR | Status: AC
  Administered 2016-10-06: 0.25 mg via INTRAVENOUS
  Filled 2016-10-06: qty 0.5

## 2016-10-06 MED ORDER — SCOPOLAMINE 1 MG/3DAYS TD PT72
1.0000 | MEDICATED_PATCH | TRANSDERMAL | Status: DC
  Administered 2016-10-06: 1.5 mg via TRANSDERMAL

## 2016-10-06 MED ORDER — BUPIVACAINE HCL (PF) 0.5 % IJ SOLN
INTRAMUSCULAR | Status: DC | PRN
  Administered 2016-10-06: 20 mL

## 2016-10-06 MED ORDER — MONTELUKAST SODIUM 10 MG PO TABS
10.0000 mg | ORAL_TABLET | Freq: Every day | ORAL | Status: DC
  Administered 2016-10-06 – 2016-10-07 (×2): 10 mg via ORAL
  Filled 2016-10-06 (×2): qty 1

## 2016-10-06 MED ORDER — ACETAMINOPHEN 325 MG PO TABS
650.0000 mg | ORAL_TABLET | ORAL | Status: DC | PRN
  Administered 2016-10-07 (×2): 650 mg via ORAL
  Filled 2016-10-06 (×2): qty 2

## 2016-10-06 MED ORDER — BUPIVACAINE HCL (PF) 0.5 % IJ SOLN
INTRAMUSCULAR | Status: AC
  Filled 2016-10-06: qty 30

## 2016-10-06 MED ORDER — POTASSIUM CHLORIDE IN NACL 20-0.9 MEQ/L-% IV SOLN
INTRAVENOUS | Status: DC
  Administered 2016-10-06: 17:00:00 via INTRAVENOUS
  Filled 2016-10-06: qty 1000

## 2016-10-06 MED ORDER — THROMBIN 5000 UNITS EX SOLR
OROMUCOSAL | Status: DC | PRN
  Administered 2016-10-06: 11:00:00 via TOPICAL

## 2016-10-06 MED ORDER — KETOROLAC TROMETHAMINE 30 MG/ML IJ SOLN
30.0000 mg | Freq: Four times a day (QID) | INTRAMUSCULAR | Status: DC
  Administered 2016-10-06 – 2016-10-07 (×5): 30 mg via INTRAVENOUS
  Filled 2016-10-06 (×6): qty 1

## 2016-10-06 MED ORDER — HEMOSTATIC AGENTS (NO CHARGE) OPTIME
TOPICAL | Status: DC | PRN
  Administered 2016-10-06 (×2): 1 via TOPICAL

## 2016-10-06 MED ORDER — FENTANYL CITRATE (PF) 100 MCG/2ML IJ SOLN
INTRAMUSCULAR | Status: AC
  Filled 2016-10-06: qty 2

## 2016-10-06 MED ORDER — INSULIN GLARGINE 100 UNIT/ML ~~LOC~~ SOLN
50.0000 [IU] | Freq: Every day | SUBCUTANEOUS | Status: DC
  Filled 2016-10-06 (×2): qty 0.5

## 2016-10-06 MED ORDER — HYDROMORPHONE HCL 1 MG/ML IJ SOLN
0.2500 mg | INTRAMUSCULAR | Status: DC | PRN
  Administered 2016-10-06 (×2): 0.25 mg via INTRAVENOUS

## 2016-10-06 MED ORDER — ROCURONIUM BROMIDE 10 MG/ML (PF) SYRINGE
PREFILLED_SYRINGE | INTRAVENOUS | Status: DC | PRN
  Administered 2016-10-06 (×2): 50 mg via INTRAVENOUS

## 2016-10-06 MED ORDER — SUCCINYLCHOLINE CHLORIDE 20 MG/ML IJ SOLN
INTRAMUSCULAR | Status: DC | PRN
  Administered 2016-10-06: 120 mg via INTRAVENOUS

## 2016-10-06 MED ORDER — SODIUM CHLORIDE 0.9% FLUSH: 3.0000 mL | INTRAVENOUS | Status: DC | PRN

## 2016-10-06 MED ORDER — THROMBIN 5000 UNITS EX SOLR
CUTANEOUS | Status: DC | PRN
  Administered 2016-10-06 (×2): 5000 [IU] via TOPICAL

## 2016-10-06 MED ORDER — INSULIN ASPART 100 UNIT/ML ~~LOC~~ SOLN
2.0000 [IU] | Freq: Three times a day (TID) | SUBCUTANEOUS | Status: DC
  Administered 2016-10-07: 30 [IU] via SUBCUTANEOUS
  Administered 2016-10-07: 25 [IU] via SUBCUTANEOUS
  Administered 2016-10-07: 10 [IU] via SUBCUTANEOUS
  Administered 2016-10-08: 12 [IU] via SUBCUTANEOUS

## 2016-10-06 MED ORDER — HYDROXYZINE HCL 50 MG/ML IM SOLN: 50.0000 mg | INTRAMUSCULAR | Status: DC | PRN

## 2016-10-06 MED ORDER — OXYCODONE-ACETAMINOPHEN 5-325 MG PO TABS
1.0000 | ORAL_TABLET | ORAL | Status: DC | PRN
  Administered 2016-10-06: 2 via ORAL
  Filled 2016-10-06: qty 2

## 2016-10-06 MED ORDER — FUROSEMIDE 20 MG PO TABS
30.0000 mg | ORAL_TABLET | Freq: Every day | ORAL | Status: DC
  Administered 2016-10-07: 30 mg via ORAL
  Filled 2016-10-06: qty 2

## 2016-10-06 MED ORDER — LIDOCAINE 2% (20 MG/ML) 5 ML SYRINGE
INTRAMUSCULAR | Status: DC | PRN
Start: 2016-10-06 — End: 2016-10-06
  Administered 2016-10-06: 100 mg via INTRAVENOUS

## 2016-10-06 MED ORDER — PANTOPRAZOLE SODIUM 40 MG PO TBEC
40.0000 mg | DELAYED_RELEASE_TABLET | Freq: Every day | ORAL | Status: DC
  Administered 2016-10-07: 40 mg via ORAL
  Filled 2016-10-06: qty 1

## 2016-10-06 MED ORDER — CHLORHEXIDINE GLUCONATE CLOTH 2 % EX PADS: 6.0000 | MEDICATED_PAD | Freq: Once | CUTANEOUS | Status: DC

## 2016-10-06 MED ORDER — LACTATED RINGERS IV SOLN
INTRAVENOUS | Status: DC
Start: 2016-10-06 — End: 2016-10-08
  Administered 2016-10-06 (×3): via INTRAVENOUS

## 2016-10-06 MED ORDER — ONDANSETRON HCL 4 MG/2ML IJ SOLN
INTRAMUSCULAR | Status: DC | PRN
  Administered 2016-10-06: 4 mg via INTRAVENOUS

## 2016-10-06 MED ORDER — SODIUM CHLORIDE 0.9 % IV SOLN: 250.0000 mL | INTRAVENOUS | Status: DC

## 2016-10-06 MED ORDER — ALUM & MAG HYDROXIDE-SIMETH 200-200-20 MG/5ML PO SUSP: 30.0000 mL | Freq: Four times a day (QID) | ORAL | Status: DC | PRN

## 2016-10-06 MED ORDER — NEOSTIGMINE METHYLSULFATE 5 MG/5ML IV SOSY
PREFILLED_SYRINGE | INTRAVENOUS | Status: AC
  Filled 2016-10-06: qty 5

## 2016-10-06 MED ORDER — SUCCINYLCHOLINE CHLORIDE 200 MG/10ML IV SOSY
PREFILLED_SYRINGE | INTRAVENOUS | Status: AC
  Filled 2016-10-06: qty 10

## 2016-10-06 MED ORDER — KCL IN DEXTROSE-NACL 20-5-0.45 MEQ/L-%-% IV SOLN
INTRAVENOUS | Status: DC
  Filled 2016-10-06: qty 1000

## 2016-10-06 MED ORDER — HYDROCODONE-ACETAMINOPHEN 5-325 MG PO TABS
1.0000 | ORAL_TABLET | ORAL | Status: DC | PRN
  Administered 2016-10-07: 1 via ORAL
  Filled 2016-10-06: qty 1

## 2016-10-06 MED ORDER — MAGNESIUM HYDROXIDE 400 MG/5ML PO SUSP: 30.0000 mL | Freq: Every day | ORAL | Status: DC | PRN

## 2016-10-06 MED ORDER — VITAMIN D 1000 UNITS PO TABS
2000.0000 [IU] | ORAL_TABLET | Freq: Every day | ORAL | Status: DC
  Administered 2016-10-07: 2000 [IU] via ORAL
  Filled 2016-10-06: qty 2

## 2016-10-06 MED ORDER — HYDROXYZINE HCL 25 MG PO TABS: 50.0000 mg | ORAL_TABLET | ORAL | Status: DC | PRN

## 2016-10-06 MED ORDER — EPHEDRINE 5 MG/ML INJ
INTRAVENOUS | Status: AC
  Filled 2016-10-06: qty 10

## 2016-10-06 MED ORDER — GLYCOPYRROLATE 0.2 MG/ML IV SOSY
PREFILLED_SYRINGE | INTRAVENOUS | Status: DC | PRN
  Administered 2016-10-06: 0.6 mg via INTRAVENOUS

## 2016-10-06 MED ORDER — LIDOCAINE-EPINEPHRINE (PF) 2 %-1:200000 IJ SOLN
INTRAMUSCULAR | Status: DC | PRN
  Administered 2016-10-06: 20 mL via INTRADERMAL

## 2016-10-06 MED ORDER — EPHEDRINE SULFATE 50 MG/ML IJ SOLN
INTRAMUSCULAR | Status: DC | PRN
  Administered 2016-10-06: 10 mg via INTRAVENOUS
  Administered 2016-10-06 (×2): 5 mg via INTRAVENOUS
  Administered 2016-10-06: 10 mg via INTRAVENOUS
  Administered 2016-10-06 (×2): 5 mg via INTRAVENOUS

## 2016-10-06 MED ORDER — PROPOFOL 10 MG/ML IV BOLUS
INTRAVENOUS | Status: AC
  Filled 2016-10-06: qty 40

## 2016-10-06 MED ORDER — FENTANYL CITRATE (PF) 100 MCG/2ML IJ SOLN
INTRAMUSCULAR | Status: DC | PRN
  Administered 2016-10-06: 50 ug via INTRAVENOUS
  Administered 2016-10-06 (×2): 25 ug via INTRAVENOUS
  Administered 2016-10-06: 100 ug via INTRAVENOUS

## 2016-10-06 MED ORDER — ATORVASTATIN CALCIUM 40 MG PO TABS
40.0000 mg | ORAL_TABLET | Freq: Every day | ORAL | Status: DC
  Administered 2016-10-06 – 2016-10-07 (×2): 40 mg via ORAL
  Filled 2016-10-06 (×2): qty 1
  Filled 2016-10-06 (×2): qty 2

## 2016-10-06 MED ORDER — OCUVITE-LUTEIN PO CAPS: 1.0000 | ORAL_CAPSULE | Freq: Every day | ORAL | Status: DC

## 2016-10-06 MED ORDER — ACETAMINOPHEN 650 MG RE SUPP: 650.0000 mg | RECTAL | Status: DC | PRN

## 2016-10-06 MED ORDER — MENTHOL 3 MG MT LOZG: 1.0000 | LOZENGE | OROMUCOSAL | Status: DC | PRN

## 2016-10-06 MED ORDER — MIDAZOLAM HCL 2 MG/2ML IJ SOLN
INTRAMUSCULAR | Status: AC
  Filled 2016-10-06: qty 2

## 2016-10-06 MED ORDER — PHENYLEPHRINE HCL 10 MG/ML IJ SOLN
INTRAVENOUS | Status: DC | PRN
  Administered 2016-10-06: 25 ug/min via INTRAVENOUS

## 2016-10-06 MED ORDER — LIDOCAINE 2% (20 MG/ML) 5 ML SYRINGE
INTRAMUSCULAR | Status: AC
  Filled 2016-10-06: qty 5

## 2016-10-06 MED ORDER — CYCLOBENZAPRINE HCL 10 MG PO TABS: 10.0000 mg | ORAL_TABLET | Freq: Three times a day (TID) | ORAL | Status: DC | PRN

## 2016-10-06 MED ORDER — THROMBIN 5000 UNITS EX SOLR
CUTANEOUS | Status: AC
  Filled 2016-10-06: qty 15000

## 2016-10-06 MED ORDER — PHENYLEPHRINE HCL 10 MG/ML IJ SOLN
INTRAMUSCULAR | Status: AC
  Filled 2016-10-06: qty 1

## 2016-10-06 MED ORDER — PHENYLEPHRINE 40 MCG/ML (10ML) SYRINGE FOR IV PUSH (FOR BLOOD PRESSURE SUPPORT)
PREFILLED_SYRINGE | INTRAVENOUS | Status: AC
  Filled 2016-10-06: qty 10

## 2016-10-06 MED ORDER — ALBUMIN HUMAN 5 % IV SOLN
INTRAVENOUS | Status: AC
  Administered 2016-10-06: 12.5 g via INTRAVENOUS
  Filled 2016-10-06: qty 250

## 2016-10-06 MED ORDER — MIDAZOLAM HCL 5 MG/5ML IJ SOLN
INTRAMUSCULAR | Status: DC | PRN
  Administered 2016-10-06: 2 mg via INTRAVENOUS

## 2016-10-06 MED ORDER — ONDANSETRON HCL 4 MG/2ML IJ SOLN
INTRAMUSCULAR | Status: AC
  Filled 2016-10-06: qty 2

## 2016-10-06 MED ORDER — PROMETHAZINE HCL 25 MG/ML IJ SOLN: 6.2500 mg | INTRAMUSCULAR | Status: DC | PRN

## 2016-10-06 MED ORDER — 0.9 % SODIUM CHLORIDE (POUR BTL) OPTIME
TOPICAL | Status: DC | PRN
  Administered 2016-10-06: 1000 mL

## 2016-10-06 MED ORDER — FAMOTIDINE 10 MG PO TABS
10.0000 mg | ORAL_TABLET | Freq: Every day | ORAL | Status: DC
  Administered 2016-10-07: 10 mg via ORAL
  Filled 2016-10-06 (×2): qty 1

## 2016-10-06 MED ORDER — SODIUM CHLORIDE 0.9 % IR SOLN
Status: DC | PRN
  Administered 2016-10-06: 08:00:00

## 2016-10-06 MED ORDER — ZOLPIDEM TARTRATE 5 MG PO TABS: 5.0000 mg | ORAL_TABLET | Freq: Every evening | ORAL | Status: DC | PRN

## 2016-10-06 SURGICAL SUPPLY — 58 items
BAG DECANTER FOR FLEXI CONT (MISCELLANEOUS) ×2 IMPLANT
BENZOIN TINCTURE PRP APPL 2/3 (GAUZE/BANDAGES/DRESSINGS) ×2 IMPLANT
BLADE CLIPPER SURG (BLADE) IMPLANT
BUR ACORN 6.0 ACORN (BURR) IMPLANT
BUR ACRON 5.0MM COATED (BURR) ×2 IMPLANT
BUR MATCHSTICK NEURO 3.0 LAGG (BURR) ×2 IMPLANT
CANISTER SUCT 3000ML PPV (MISCELLANEOUS) ×2 IMPLANT
CARTRIDGE OIL MAESTRO DRILL (MISCELLANEOUS) ×1 IMPLANT
DERMABOND ADVANCED (GAUZE/BANDAGES/DRESSINGS) ×1
DERMABOND ADVANCED .7 DNX12 (GAUZE/BANDAGES/DRESSINGS) ×1 IMPLANT
DIFFUSER DRILL AIR PNEUMATIC (MISCELLANEOUS) ×2 IMPLANT
DRAPE LAPAROTOMY 100X72X124 (DRAPES) ×2 IMPLANT
DRAPE MICROSCOPE LEICA (MISCELLANEOUS) ×2 IMPLANT
DRAPE POUCH INSTRU U-SHP 10X18 (DRAPES) ×2 IMPLANT
ELECT REM PT RETURN 9FT ADLT (ELECTROSURGICAL) ×2
ELECTRODE REM PT RTRN 9FT ADLT (ELECTROSURGICAL) ×1 IMPLANT
GAUZE SPONGE 4X4 12PLY STRL (GAUZE/BANDAGES/DRESSINGS) ×2 IMPLANT
GAUZE SPONGE 4X4 16PLY XRAY LF (GAUZE/BANDAGES/DRESSINGS) IMPLANT
GLOVE BIOGEL PI IND STRL 8 (GLOVE) ×1 IMPLANT
GLOVE BIOGEL PI INDICATOR 8 (GLOVE) ×1
GLOVE ECLIPSE 6.5 STRL STRAW (GLOVE) ×2 IMPLANT
GLOVE ECLIPSE 7.5 STRL STRAW (GLOVE) ×2 IMPLANT
GLOVE EXAM NITRILE LRG STRL (GLOVE) IMPLANT
GLOVE EXAM NITRILE XL STR (GLOVE) IMPLANT
GLOVE EXAM NITRILE XS STR PU (GLOVE) IMPLANT
GLOVE INDICATOR 7.5 STRL GRN (GLOVE) ×2 IMPLANT
GLOVE SURG SS PI 7.0 STRL IVOR (GLOVE) ×6 IMPLANT
GOWN STRL REUS W/ TWL LRG LVL3 (GOWN DISPOSABLE) ×1 IMPLANT
GOWN STRL REUS W/ TWL XL LVL3 (GOWN DISPOSABLE) ×2 IMPLANT
GOWN STRL REUS W/TWL 2XL LVL3 (GOWN DISPOSABLE) IMPLANT
GOWN STRL REUS W/TWL LRG LVL3 (GOWN DISPOSABLE) ×1
GOWN STRL REUS W/TWL XL LVL3 (GOWN DISPOSABLE) ×2
HEMOSTAT POWDER SURGIFOAM 1G (HEMOSTASIS) ×2 IMPLANT
KIT BASIN OR (CUSTOM PROCEDURE TRAY) ×2 IMPLANT
KIT ROOM TURNOVER OR (KITS) ×2 IMPLANT
NEEDLE HYPO 18GX1.5 BLUNT FILL (NEEDLE) IMPLANT
NEEDLE SPNL 18GX3.5 QUINCKE PK (NEEDLE) ×2 IMPLANT
NEEDLE SPNL 22GX3.5 QUINCKE BK (NEEDLE) ×2 IMPLANT
NS IRRIG 1000ML POUR BTL (IV SOLUTION) ×2 IMPLANT
OIL CARTRIDGE MAESTRO DRILL (MISCELLANEOUS) ×2
PACK LAMINECTOMY NEURO (CUSTOM PROCEDURE TRAY) ×2 IMPLANT
PAD ARMBOARD 7.5X6 YLW CONV (MISCELLANEOUS) ×6 IMPLANT
PATTIES SURGICAL .5 X1 (DISPOSABLE) ×2 IMPLANT
RUBBERBAND STERILE (MISCELLANEOUS) ×4 IMPLANT
SEALANT ADHERUS EXTEND TIP (MISCELLANEOUS) ×2 IMPLANT
SPONGE LAP 4X18 X RAY DECT (DISPOSABLE) IMPLANT
SPONGE SURGIFOAM ABS GEL SZ50 (HEMOSTASIS) ×2 IMPLANT
STRIP CLOSURE SKIN 1/2X4 (GAUZE/BANDAGES/DRESSINGS) IMPLANT
SUT PROLENE 6 0 BV (SUTURE) ×12 IMPLANT
SUT VIC AB 1 CT1 18XBRD ANBCTR (SUTURE) ×1 IMPLANT
SUT VIC AB 1 CT1 8-18 (SUTURE) ×1
SUT VIC AB 2-0 CP2 18 (SUTURE) ×4 IMPLANT
SUT VIC AB 3-0 SH 8-18 (SUTURE) IMPLANT
SYR 5ML LL (SYRINGE) IMPLANT
TAPE CLOTH SURG 4X10 WHT LF (GAUZE/BANDAGES/DRESSINGS) ×2 IMPLANT
TOWEL OR 17X24 6PK STRL BLUE (TOWEL DISPOSABLE) ×2 IMPLANT
TOWEL OR 17X26 10 PK STRL BLUE (TOWEL DISPOSABLE) ×2 IMPLANT
WATER STERILE IRR 1000ML POUR (IV SOLUTION) ×2 IMPLANT

## 2016-10-06 NOTE — Progress Notes (Signed)
Hypoglycemic Event  CBG: 56  Treatment: orange juice 4oz  Symptoms: None  Follow-up CBG: Time:1706 CBG Result:57  Possible Reasons for Event: surgery/meds  Comments/MD notified:None    Tony Cantu, Royston Bake

## 2016-10-06 NOTE — H&P (Signed)
Subjective: Patient is a 66 y.o. 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 Symptomatically he's been having difficulties with persistent right lumbar radicular pain. He describes pain in the right side of his low back radiating into the lateral right hip and into the posterior right thigh and calf.  He is admitted now for right L3-4 lumbar laminotomy, foraminotomy, and microdiscectomy.3   Patient Active Problem List   Diagnosis Date Noted  . Acute renal injury ([place]) 08/06/2016  . Diarrhea 08/06/2016  . Fever 08/06/2016  . Hyperbilirubinemia 08/06/2016  . Septic shock ([place]) 08/06/2016  . Lactic acidosis 08/06/2016  . Type 2 diabetes mellitus with hyperlipidemia ([place]) 01/18/2016  . Inguinal hernia 12/10/2015  . Right groin pain 11/30/2015  . Thrombocytopenia ([place]) 07/21/2014  . Bilateral carotid bruits 05/11/2014  . Vitamin D deficiency 09/17/2013  . BPH (benign prostatic hyperplasia) 05/22/2013  . 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 Diabetes type 2, controlled ([place]) 01/10/2013  . Essential hypertension, benign 01/10/2013  . OSA (obstructive sleep apnea) 05/16/2012  . Edema 02/21/2012  . At risk for coronary artery disease 03/20/2011  . Obesity 03/20/2011  . WEIGHT GAIN, ABNORMAL 11/09/2010  . Hyperlipidemia 06/22/2010  . ALLERGIC RHINITIS 06/22/2010  . ASTHMA 06/22/2010  . COUGH 06/22/2010   Past Medical History:  Diagnosis Date  . Arthritis   . Asthma   . BPH (benign prostatic hypertrophy)   . Colon polyps   . Depression    no meds  . Diabetes mellitus without complication ([place])   . Gastritis   . GERD (gastroesophageal reflux disease)   . Headache    HX  . Hypertension   . PONV (postoperative nausea and vomiting)   . Seizures ([place])    HYPOGLYCEMIC  . Sleep apnea    uses CPAP nightly  . Testicle  trouble    one testicle    Past Surgical History:  Procedure Laterality Date  . BACK SURGERY     94  . CARPAL TUNNEL RELEASE Right 10/13/2015   Procedure: RIGHT CARPAL TUNNEL RELEASE;  Surgeon: Daryll Brod, MD;  Location: [facility];  Service: Orthopedics;  Laterality: Right;  . CARPAL TUNNEL RELEASE Left 07/05/2016   Procedure: LEFT CARPAL TUNNEL RELEASE;  Surgeon: Daryll Brod, MD;  Location: [facility];  Service: Orthopedics;  Laterality: Left;  . HERNIA REPAIR    . INGUINAL HERNIA REPAIR  2003   right   . KNEE ARTHROSCOPY Right 03/03/2016  . LUMBAR DISC SURGERY  8/96   Dr. Coralyn Mark, discectomy  . SHOULDER SURGERY  11/28/05   left partial  . SHOULDER SURGERY  07/14/2006   RIGHT  . TONSILECTOMY, ADENOIDECTOMY, BILATERAL MYRINGOTOMY AND TUBES     NO TUBES     Prescriptions Prior to Admission  Medication Sig Dispense Refill Last Dose  . aspirin EC 81 MG tablet Take 81 mg by mouth daily with breakfast.    09/30/2016  . atorvastatin (LIPITOR) 40 MG tablet Take 1 tablet (40 mg total) by mouth daily. (Patient taking differently: Take 40 mg by mouth at bedtime. ) 90 tablet 3 10/05/2016 at Unknown time  . b complex vitamins tablet Take 1 tablet by mouth daily at 12 noon.    Past Week at Unknown time  . Calcium Carbonate (CALTRATE 600 PO) Take 1 tablet by mouth daily at 12 noon.    Past  Week at Unknown time  . Cholecalciferol (VITAMIN D3) 2000 UNITS capsule Take 2,000 Units by mouth daily at 12 noon.    Past Week at Unknown time  . fexofenadine (ALLEGRA) 180 MG tablet Take 180 mg by mouth daily with breakfast.    10/06/2016 at 0545  . Flaxseed, Linseed, (FLAX SEED OIL PO) Take 1 capsule by mouth daily with breakfast.    09/30/2016  . furosemide (LASIX) 20 MG tablet Take 1.5 tablets (30 mg total) by mouth daily. 135 tablet 3 10/05/2016 at Unknown time  . Glucosamine-Chondroit-Vit C-Mn (GLUCOSAMINE 1500 COMPLEX) CAPS Take 2 capsules by mouth daily at 12 noon.    Past Week at  Unknown time  . insulin glargine (LANTUS) 100 UNIT/ML injection Inject 0.75 mLs (75 Units total) into the skin 2 (two) times daily. As directed (Patient taking differently: Inject 50-75 Units into the skin 2 (two) times daily. Inject 50 units in the morning and 75 units at night) 12 vial 3 10/06/2016 at 0545  . insulin lispro (HUMALOG) 100 UNIT/ML injection Inject 0.2-0.5 mLs (20-50 Units total) into the skin 3 (three) times daily before meals. Sliding scale (Patient taking differently: Inject 2-32 Units into the skin every 2 (two) hours as needed for high blood sugar. Sliding scale) 12 vial 3 10/05/2016 at 2030  . montelukast (SINGULAIR) 10 MG tablet Take 1 tablet (10 mg total) by mouth at bedtime. 90 tablet 3 10/05/2016 at Unknown time  . MULTIPLE VITAMIN PO Take 1 tablet by mouth daily at 12 noon.    Past Week at Unknown time  . multivitamin-lutein (OCUVITE-LUTEIN) CAPS capsule Take 1 capsule by mouth daily at 12 noon.   Past Week at Unknown time  . pantoprazole (PROTONIX) 40 MG tablet Take 1 tablet (40 mg total) by mouth daily. 90 tablet 3 10/06/2016 at 0545  . ranitidine (ZANTAC) 75 MG tablet Take 75 mg by mouth daily with breakfast.    10/06/2016 at 0545  . Testosterone (AXIRON) 30 MG/ACT SOLN Place 1 application onto the skin daily. Place 1 application under each axilla daily 270 mL 3 Past Week at Unknown time  . valsartan (DIOVAN) 160 MG tablet Take 1 tablet (160 mg total) by mouth daily. (Patient taking differently: Take 160 mg by mouth at bedtime. ) 90 tablet 3 10/05/2016 at Unknown time  . B-D ULTRA-FINE 33 LANCETS MISC Check Blood sugar 8 times daily and PRN.DX E11.9 800 each 3 Taking  . glucagon (GLUCAGON EMERGENCY) 1 MG injection Inject 1 mg into the skin once as needed. 1 each 3   . glucose blood (ONE TOUCH ULTRA TEST) test strip chesk BS q 2 hours PRN.dx.E11.9 800 each 3 Taking  . HYDROcodone-acetaminophen (NORCO) 5-325 MG tablet Take 1 tablet by mouth every 6 (six) hours as needed for moderate  pain. 20 tablet 0 Unknown at Unknown time  . Insulin Syringe-Needle U-100 (BD INSULIN SYRINGE ULTRAFINE) 31G X 5/16" 0.5 ML MISC USE TO INJECT 8 TO 10 TIMES DAILY, dx E11.9 800 each 3 Taking  . traMADol (ULTRAM) 50 MG tablet Take 1 tablet (50 mg total) by mouth every 6 (six) hours as needed. (Patient taking differently: Take 25-50 mg by mouth every 6 (six) hours as needed for moderate pain (and headaches). ) 30 tablet 0 Unknown at Unknown time   Allergies  Allergen Reactions  . Erythromycin Diarrhea  . Sulfonamide Derivatives Diarrhea    Social History  Substance Use Topics  . Smoking status: Former Smoker    Years: 34.00  Types: Cigarettes, Pipe    Quit date: 08/29/2001  . Smokeless tobacco: Never Used     Comment: quit 2005 smoked cigarettes for 5 yrs prior to pipe use  . Alcohol use No     Comment: once a year    Family History  Problem Relation Age of Onset  . Heart failure Mother   . Heart attack Mother     in her 6's  . Alzheimer's disease Mother   . Diabetes Mother   . Asthma Father   . Suicidality Father     in his 55's  . Allergic rhinitis Sister   . Heart failure Maternal Grandmother   . Rheum arthritis Maternal Grandmother   . Breast cancer Maternal Grandmother   . Heart attack Maternal Grandmother     in her 46's     Review of Systems A comprehensive review of systems was negative.  Objective: Vital signs in last 24 hours: Temp:  [98.1 F (36.7 C)] 98.1 F (36.7 C) (02/08 0756) Pulse Rate:  [63] 63 (02/08 0756) Resp:  [18] 18 (02/08 0756) BP: (128)/(58) 128/58 (02/08 0756) SpO2:  [98 %] 98 % (02/08 0756) Weight:  [119.4 kg (263 lb 3.2 oz)] 119.4 kg (263 lb 3.2 oz) (02/08 0756)  EXAM: Patient was well-nourished white male in no acute distress. Lungs are clear to auscultation , the patient has symmetrical respiratory excursion. Heart has a regular rate and rhythm normal S1 and S2 no murmur.   Abdomen is soft nontender nondistended bowel sounds are  present. Extremity examination shows no clubbing cyanosis or edema. Motor examination shows 5 over 5 strength in the lower extremities including the iliopsoas quadriceps dorsiflexor extensor hallicus  longus and plantar flexor bilaterally. Sensation is intact to pinprick in the distal lower extremities. Reflexes are symmetrical bilaterally. No pathologic reflexes are present. Patient has a normal gait and stance.   Data Review:CBC     Component Value Date/Time   WBC 8.0 10/04/2016 1125   RBC 4.98 10/04/2016 1125   HGB 16.2 10/04/2016 1125   HCT 49.1 10/04/2016 1125   HCT 48.7 08/24/2016 1310   PLT 149 (L) 10/04/2016 1125   PLT 322 08/24/2016 1310   MCV 98.6 10/04/2016 1125   MCV 102 (H) 08/24/2016 1310   MCH 32.5 10/04/2016 1125   MCHC 33.0 10/04/2016 1125   RDW 14.4 10/04/2016 1125   RDW 13.7 08/24/2016 1310   LYMPHSABS 1.3 08/12/2016 1054   MONOABS 2.0 (H) 08/06/2016 1406   EOSABS 0.2 08/12/2016 1054   BASOSABS 0.1 08/12/2016 1054                          BMET    Component Value Date/Time   NA 140 10/04/2016 1125   NA 142 08/12/2016 1054   K 4.0 10/04/2016 1125   CL 106 10/04/2016 1125   CO2 25 10/04/2016 1125   GLUCOSE 58 (L) 10/04/2016 1125   BUN 8 10/04/2016 1125   BUN 11 08/12/2016 1054   CREATININE 0.85 10/04/2016 1125   CREATININE 0.82 01/07/2013 0840   CALCIUM 9.6 10/04/2016 1125   GFRNONAA >60 10/04/2016 1125   GFRNONAA >89 01/07/2013 0840   GFRAA >60 10/04/2016 1125   GFRAA >89 01/07/2013 0840     Assessment/Plan: Patient with right lumbar radiculopathy secondary to 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 He is admitted now for right L3-4 lumbar laminotomy,  foraminotomy, and microscopic discectomy.  I've discussed with the patient the nature of his condition, the nature the surgical procedure, the typical length of surgery, hospital stay, and overall  recuperation. We discussed limitations postoperatively. I discussed risks of surgery including risks of infection, bleeding, possibly need for transfusion, the risk of nerve root dysfunction with pain, weakness, numbness, or paresthesias, or risk of dural tear and CSF leakage and possible need for further surgery, the risk of recurrent disc herniation and the possible need for further surgery, and the risk of anesthetic complications including myocardial infarction, stroke, pneumonia, and death. Understanding all this the patient does wish to proceed with surgery and is admitted for such.    Hosie Spangle, MD 10/06/2016 8:08 AM

## 2016-10-06 NOTE — Progress Notes (Signed)
Hypoglycemic Event  CBG: 59  Treatment: Glucose Tabs  Symptoms: None  Follow-up CBG: Time: 1838 CBG Result:94  Possible Reasons for Event: Surgery/Meds  Comments/MD notified:None    Kaizen Ibsen, Royston Bake

## 2016-10-06 NOTE — Transfer of Care (Signed)
Immediate Anesthesia Transfer of Care Note  Patient: Tony Cantu  Procedure(s) Performed: Procedure(s): RIGHT LUMBAR THREE - LUMBAR FOUR  LAMINECTOMY, FORAMINOTOMY AND MICRODISCECTOMY (Right)  Patient Location: PACU  Anesthesia Type:General  Level of Consciousness: patient cooperative and responds to stimulation  Airway & Oxygen Therapy: Patient Spontanous Breathing and Patient connected to face mask oxygen  Post-op Assessment: Report given to RN, Post -op Vital signs reviewed and stable and Patient moving all extremities X 4  Post vital signs: Reviewed and stable  Last Vitals:  Vitals:   10/06/16 1147 10/06/16 1154  BP:  (!) 100/48  Pulse:  85  Resp:  17  Temp: 36.4 C     Last Pain:  Vitals:   10/06/16 1208  TempSrc:   PainSc: 0-No pain         Complications: No apparent anesthesia complications

## 2016-10-06 NOTE — Progress Notes (Signed)
Vitals:   10/06/16 1247 10/06/16 1302 10/06/16 1350 10/06/16 1626  BP: (!) 84/55 (!) 82/51 (!) 109/53 (!) 97/50  Pulse: 78 (!) 58 66 64  Resp: (!) 26 16 18 18   Temp:   97.4 F (36.3 C) 98 F (36.7 C)  TempSrc:   Oral Oral  SpO2: 92% 93% 99% 98%  Weight:      Height:        CBC  Recent Labs  10/04/16 1125  WBC 8.0  HGB 16.2  HCT 49.1  PLT 149*   BMET  Recent Labs  10/04/16 1125  NA 140  K 4.0  CL 106  CO2 25  GLUCOSE 58*  BUN 8  CREATININE 0.85  CALCIUM 9.6    Patient resting in bed, head of bed flat, comfortable, denies pain. He denies any headache. Dressing clean and dry. Foley to straight drainage. CBGs have been running from the mid 74s to the mid 90s. He did eat both lunch and dinner. He also had juice, glucose tablets, and so on. We will hold tonight's Lantus and continue to monitor his CBGs. I've asked that he have a late evening snack. He is moving his lower extremities well.  Plan: Continue bedrest, with head of bed flat. We'll reassess in a.m. Continue Foley to straight drainage. We will assess a.m. Lantus after reviewing overnight and a.m. CBGs.  Hosie Spangle, MD 10/06/2016, 7:05 PM

## 2016-10-06 NOTE — Anesthesia Postprocedure Evaluation (Addendum)
Anesthesia Post Note  Patient: Tony Cantu  Procedure(s) Performed: Procedure(s) (LRB): RIGHT LUMBAR THREE - LUMBAR FOUR  LAMINECTOMY, FORAMINOTOMY AND MICRODISCECTOMY (Right)  Patient location during evaluation: PACU Anesthesia Type: General Level of consciousness: sedated Pain management: pain level controlled Vital Signs Assessment: post-procedure vital signs reviewed and stable Respiratory status: spontaneous breathing and respiratory function stable Cardiovascular status: stable Anesthetic complications: no       Last Vitals:  Vitals:   10/06/16 1247 10/06/16 1302  BP: (!) 84/55 (!) 82/51  Pulse: 78 (!) 58  Resp: (!) 26 16  Temp:      Last Pain:  Vitals:   10/06/16 1310  TempSrc:   PainSc: Asleep                 Leighla Chestnutt DANIEL

## 2016-10-06 NOTE — Op Note (Signed)
10/06/2016  11:22 AM  PATIENT:  Tony Cantu  66 y.o. 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  ANESTHESIA:   general  EBL:  Total I/O In: 1500 [I.V.:1500] Out: 100 [Blood:100]  BLOOD ADMINISTERED:none  COUNT: Correct per nursing staff  DICTATION: Patient was brought to the operating room and placed under general endotracheal anesthesia. Patient was turned to prone position the lumbar region was prepped with Betadine soap and solution and draped in a sterile fashion. The midline was infiltrated with local anesthetic with epinephrine. A localizing x-ray was taken and the L3-4 level was identified. Midline incision was made over the L3-4 level and was carried down through the subcutaneous tissue to the lumbar fascia. The lumbar fascia was incised on the right side and the paraspinal muscles were dissected from the spinous processes and lamina in a subperiosteal fashion. Another x-ray was taken and the L3-4 intralaminar space was identified. The operating microscope was draped and brought into the field provided additional magnification, illumination, and visualization. Laminotomy was performed using the high-speed drill and Kerrison punches. The ligamentum flavum was carefully resected. The underlying thecal sac and nerve root were identified.   We began to mobilize the thecal sac medially. The dural tube was adherent to the  annulus and posterior longitudinal ligament, and it was difficult to mobilize medially. We therefore incised the anulus laterally, gently retracting the thecal sac with the Derrico retractor. Intradiscal discectomy was performed using a variety of micro-curettes and pituitary rongeurs, and the disc material was found to be quite degenerated, and all loose disc material was removed. When we removed the Derrico retractor, we found the ventral medial aspect of the dura was lacerated, and there was CSF leakage. We then proceeded with the repair of the dural laceration using 5 interrupted 6-0 Prolene sutures. Good dural closure was achieved, and it was tested against a Valsalva maneuver. We then applied a layer of Adheris with a dural repair. Gelfoam and Surgifoam were used to establish hemostasis.  Hemostasis was confirmed, and we proceeded with closure. Deep fascia was closed with interrupted undyed 1 Vicryl sutures. Scarpa's fascia was closed with interrupted undyed 1 and 2-0 Vicryl sutures, and  the subcutaneous and subcuticular layer were closed with interrupted inverted 2-0 undyed Vicryl sutures. The skin edges were approximated with Dermabond.  A dressing of sterile gauze and Hypafix was applied. Following surgery the patient was turned back to a supine position to be reversed from the anesthetic extubated and transferred to the recovery room for further care.   PLAN OF CARE: Admit for overnight observation  PATIENT DISPOSITION:  PACU - hemodynamically stable.   Delay start of Pharmacological VTE agent (>24hrs) due to surgical blood loss or risk of bleeding:  yes

## 2016-10-06 NOTE — Anesthesia Procedure Notes (Signed)
Procedure Name: Intubation Date/Time: 10/06/2016 8:33 AM Performed by: Judeth Cornfield T Pre-anesthesia Checklist: Patient identified, Emergency Drugs available, Suction available and Patient being monitored Patient Re-evaluated:Patient Re-evaluated prior to inductionOxygen Delivery Method: Circle System Utilized Preoxygenation: Pre-oxygenation with 100% oxygen Intubation Type: IV induction Ventilation: Mask ventilation without difficulty Laryngoscope Size: Glidescope, Mac and 4 Grade View: Grade II Tube type: Oral Tube size: 7.5 mm Number of attempts: 2 Airway Equipment and Method: Stylet,  Oral airway,  Video-laryngoscopy and Rigid stylet Placement Confirmation: ETT inserted through vocal cords under direct vision,  positive ETCO2 and breath sounds checked- equal and bilateral Secured at: 22 cm Tube secured with: Tape Dental Injury: Teeth and Oropharynx as per pre-operative assessment  Difficulty Due To: Difficulty was anticipated Future Recommendations: Recommend- induction with short-acting agent, and alternative techniques readily available

## 2016-10-07 ENCOUNTER — Encounter (HOSPITAL_COMMUNITY): Payer: Self-pay | Admitting: Neurosurgery

## 2016-10-07 DIAGNOSIS — E669 Obesity, unspecified: Secondary | ICD-10-CM | POA: Diagnosis present

## 2016-10-07 DIAGNOSIS — J45909 Unspecified asthma, uncomplicated: Secondary | ICD-10-CM | POA: Diagnosis present

## 2016-10-07 DIAGNOSIS — Z888 Allergy status to other drugs, medicaments and biological substances status: Secondary | ICD-10-CM | POA: Diagnosis not present

## 2016-10-07 DIAGNOSIS — K219 Gastro-esophageal reflux disease without esophagitis: Secondary | ICD-10-CM | POA: Diagnosis present

## 2016-10-07 DIAGNOSIS — I10 Essential (primary) hypertension: Secondary | ICD-10-CM | POA: Diagnosis present

## 2016-10-07 DIAGNOSIS — Z6837 Body mass index (BMI) 37.0-37.9, adult: Secondary | ICD-10-CM | POA: Diagnosis not present

## 2016-10-07 DIAGNOSIS — Z794 Long term (current) use of insulin: Secondary | ICD-10-CM | POA: Diagnosis not present

## 2016-10-07 DIAGNOSIS — Z825 Family history of asthma and other chronic lower respiratory diseases: Secondary | ICD-10-CM | POA: Diagnosis not present

## 2016-10-07 DIAGNOSIS — R569 Unspecified convulsions: Secondary | ICD-10-CM | POA: Diagnosis present

## 2016-10-07 DIAGNOSIS — Z7982 Long term (current) use of aspirin: Secondary | ICD-10-CM | POA: Diagnosis not present

## 2016-10-07 DIAGNOSIS — M5136 Other intervertebral disc degeneration, lumbar region: Secondary | ICD-10-CM | POA: Diagnosis present

## 2016-10-07 DIAGNOSIS — Z87891 Personal history of nicotine dependence: Secondary | ICD-10-CM | POA: Diagnosis not present

## 2016-10-07 DIAGNOSIS — M5126 Other intervertebral disc displacement, lumbar region: Secondary | ICD-10-CM | POA: Diagnosis present

## 2016-10-07 DIAGNOSIS — M48061 Spinal stenosis, lumbar region without neurogenic claudication: Secondary | ICD-10-CM | POA: Diagnosis present

## 2016-10-07 DIAGNOSIS — Z79899 Other long term (current) drug therapy: Secondary | ICD-10-CM | POA: Diagnosis not present

## 2016-10-07 DIAGNOSIS — G4733 Obstructive sleep apnea (adult) (pediatric): Secondary | ICD-10-CM | POA: Diagnosis present

## 2016-10-07 DIAGNOSIS — Z8249 Family history of ischemic heart disease and other diseases of the circulatory system: Secondary | ICD-10-CM | POA: Diagnosis not present

## 2016-10-07 DIAGNOSIS — Z882 Allergy status to sulfonamides status: Secondary | ICD-10-CM | POA: Diagnosis not present

## 2016-10-07 DIAGNOSIS — M47816 Spondylosis without myelopathy or radiculopathy, lumbar region: Secondary | ICD-10-CM | POA: Diagnosis present

## 2016-10-07 DIAGNOSIS — Z86010 Personal history of colonic polyps: Secondary | ICD-10-CM | POA: Diagnosis not present

## 2016-10-07 LAB — GLUCOSE, CAPILLARY
Glucose-Capillary: 158 mg/dL — ABNORMAL HIGH (ref 65–99)
Glucose-Capillary: 192 mg/dL — ABNORMAL HIGH (ref 65–99)
Glucose-Capillary: 214 mg/dL — ABNORMAL HIGH (ref 65–99)
Glucose-Capillary: 234 mg/dL — ABNORMAL HIGH (ref 65–99)
Glucose-Capillary: 241 mg/dL — ABNORMAL HIGH (ref 65–99)
Glucose-Capillary: 247 mg/dL — ABNORMAL HIGH (ref 65–99)
Glucose-Capillary: 270 mg/dL — ABNORMAL HIGH (ref 65–99)
Glucose-Capillary: 296 mg/dL — ABNORMAL HIGH (ref 65–99)
Glucose-Capillary: 80 mg/dL (ref 65–99)

## 2016-10-07 MED ORDER — INSULIN GLARGINE 100 UNIT/ML ~~LOC~~ SOLN
40.0000 [IU] | Freq: Once | SUBCUTANEOUS | Status: AC
  Administered 2016-10-07: 40 [IU] via SUBCUTANEOUS
  Filled 2016-10-07: qty 0.4

## 2016-10-07 NOTE — Progress Notes (Signed)
Vitals:   10/06/16 2353 10/07/16 0415 10/07/16 0629 10/07/16 0730  BP: (!) 105/55 (!) 114/58  (!) 123/55  Pulse: 89 80  92  Resp: 18 18  18   Temp: 98.5 F (36.9 C) 98.4 F (36.9 C) 98.4 F (36.9 C) 98.1 F (36.7 C)  TempSrc: Oral Oral Oral Oral  SpO2: 95% 95%  93%  Weight:      Height:        CBC  Recent Labs  10/04/16 1125  WBC 8.0  HGB 16.2  HCT 49.1  PLT 149*   BMET  Recent Labs  10/04/16 1125  NA 140  K 4.0  CL 106  CO2 25  GLUCOSE 58*  BUN 8  CREATININE 0.85  CALCIUM 9.6    Patient resting in bed, comfortable. Denies pain. Patient's wife as well as nursing staff describe some mild confusion. Patient currently awake, alert, oriented to name, [facility], and February 2018. Dressing clean and dry. Denies headache. Moving all extremities well.  Blood sugars range from 137-147. Given some coverage this morning, but did not receive Lantus last night.  Plan: We'll begin to mobilize, gradually raising head of bed, and allowed to sit up, and then ambulating. We'll give Lantus 40 units this morning, and monitor blood sugars to the day, and decide on evening dose of Lantus.  Hosie Spangle, MD 10/07/2016, 8:38 AM

## 2016-10-08 LAB — INFLUENZA PANEL BY PCR (TYPE A & B)
Influenza A By PCR: NEGATIVE
Influenza B By PCR: NEGATIVE

## 2016-10-08 LAB — GLUCOSE, CAPILLARY
Glucose-Capillary: 63 mg/dL — ABNORMAL LOW (ref 65–99)
Glucose-Capillary: 90 mg/dL (ref 65–99)
Glucose-Capillary: 132 mg/dL — ABNORMAL HIGH (ref 65–99)
Glucose-Capillary: 187 mg/dL — ABNORMAL HIGH (ref 65–99)

## 2016-10-08 NOTE — Progress Notes (Signed)
RT assisted the patient with applying the CPAP with settings on Auto titrate max 20cmH20 and min 5cmH2O.

## 2016-10-08 NOTE — Progress Notes (Signed)
Pt doing well. Pt and wife given D/C instructions with verbal understanding. Pt's incision is clean and dry with no sign of infection. Pt's IV was removed prior to D/C. Pt received 3-n-1 from [facility] prior to D/C. Pt D/C'd home via wheelchair @ 0935 per MD order. Pt is stable @ D/C and has no other needs at this time. Holli Humbles, RN

## 2016-10-08 NOTE — Discharge Instructions (Signed)
Wound Care Leave incision open to air. You may shower. Do not scrub directly on incision.  Do not put any creams, lotions, or ointments on incision. Activity Walk each and every day, increasing distance each day. No lifting greater than 5 lbs.  Avoid bending, arching, and twisting. No driving for 2 weeks; may ride as a passenger locally. If provided with back brace, wear when out of bed.  It is not necessary to wear in bed. Diet Resume your normal diet.  Return to Work Will be discussed at you follow up appointment. Call Your Doctor If Any of These Occur Redness, drainage, or swelling at the wound.  Temperature greater than 101 degrees. Severe pain not relieved by pain medication. Incision starts to come apart. Follow Up Appt Call today for appointment in 3 weeks ([phone]) or for problems.  If you have any hardware placed in your spine, you will need an x-ray before your appointment.

## 2016-10-08 NOTE — Discharge Summary (Signed)
Physician Discharge Summary  Patient ID: Tony Cantu MRN: [id number] DOB/AGE: 01/13/1951 66 y.o.  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 Intraoperatively we had a dural tear that required closure. He was kept on bedrest, with the head of bed flat overnight, and then mobilized yesterday. He has made good progress. He is up and ambulate actively. Mild incisional discomfort and mild discomfort in the lower extremities, but no headache. Wound clean and dry; no erythema, swelling, or drainage. Patient is being discharged home with instructions regarding wound care and activities. He is scheduled to follow-up with me in the office in 3 weeks.  Discharge Exam: Blood pressure (!) 145/82, pulse 82, temperature 98.5 F (36.9 C), temperature source Oral, resp. rate 18, height 5' 10"  (1.778 m), weight 119.4 kg (263 lb 3.2 oz), SpO2 95 %.  Disposition: 01-Home or Self Care  Discharge Instructions    Discharge wound care:    Complete by:  As directed    Leave the wound open to air. Shower daily with the wound uncovered. Water and soapy water should run over the incision area. Do not wash directly on the incision for 2 weeks. Remove the glue after 2 weeks.   Driving Restrictions    Complete by:  As directed    No driving for 2 weeks. May ride in the car locally now. May begin to drive locally in 2 weeks.   Other Restrictions    Complete by:  As  directed    Walk gradually increasing distances out in the fresh air at least twice a day. Walking additional 6 times inside the house, gradually increasing distances, daily. No bending, lifting, or twisting. Perform activities between shoulder and waist height (that is at counter height when standing or table height when sitting).     Allergies as of 10/08/2016      Reactions   Erythromycin Diarrhea   Sulfonamide Derivatives Diarrhea      Medication List    TAKE these medications   aspirin EC 81 MG tablet Take 81 mg by mouth daily with breakfast.   atorvastatin 40 MG tablet Commonly known as:  LIPITOR Take 1 tablet (40 mg total) by mouth daily. What changed:  when to take this   b complex vitamins tablet Take 1 tablet by mouth daily at 12 noon.   B-D ULTRA-FINE 33 LANCETS Misc Check Blood sugar 8 times daily and PRN.DX E11.9   CALTRATE 600 PO Take 1 tablet by mouth daily at 12 noon.   fexofenadine 180 MG tablet Commonly known as:  ALLEGRA Take 180 mg by mouth daily with breakfast.   FLAX SEED OIL PO Take 1 capsule by mouth daily with breakfast.   furosemide 20 MG tablet Commonly known as:  LASIX Take 1.5 tablets (30 mg total) by mouth daily.   glucagon 1 MG injection Commonly known as:  GLUCAGON EMERGENCY Inject 1 mg into the skin once as needed.   GLUCOSAMINE 1500 COMPLEX Caps Take 2 capsules by mouth daily at 12 noon.   glucose blood test strip Commonly known as:  ONE TOUCH ULTRA TEST chesk BS q 2 hours PRN.dx.E11.9   HYDROcodone-acetaminophen 5-325 MG tablet Commonly known as:  NORCO Take 1 tablet by mouth every 6 (six) hours as needed for moderate pain.   insulin glargine 100 UNIT/ML injection Commonly known as:  LANTUS Inject 0.75 mLs (75 Units total) into the skin 2 (two) times daily. As directed What changed:  how much to take  additional instructions   insulin lispro 100 UNIT/ML injection Commonly known as:  HUMALOG Inject 0.2-0.5 mLs  (20-50 Units total) into the skin 3 (three) times daily before meals. Sliding scale What changed:  how much to take  when to take this  reasons to take this  additional instructions   Insulin Syringe-Needle U-100 31G X 5/16" 0.5 ML Misc Commonly known as:  BD INSULIN SYRINGE ULTRAFINE USE TO INJECT 8 TO 10 TIMES DAILY, dx E11.9   montelukast 10 MG tablet Commonly known as:  SINGULAIR Take 1 tablet (10 mg total) by mouth at bedtime.   MULTIPLE VITAMIN PO Take 1 tablet by mouth daily at 12 noon.   multivitamin-lutein Caps capsule Take 1 capsule by mouth daily at 12 noon.   pantoprazole 40 MG tablet Commonly known as:  PROTONIX Take 1 tablet (40 mg total) by mouth daily.   ranitidine 75 MG tablet Commonly known as:  ZANTAC Take 75 mg by mouth daily with breakfast.   Testosterone 30 MG/ACT Soln Commonly known as:  AXIRON Place 1 application onto the skin daily. Place 1 application under each axilla daily   traMADol 50 MG tablet Commonly known as:  ULTRAM Take 1 tablet (50 mg total) by mouth every 6 (six) hours as needed. What changed:  how much to take  reasons to take this   valsartan 160 MG tablet Commonly known as:  DIOVAN Take 1 tablet (160 mg total) by mouth daily. What changed:  when to take this   Vitamin D3 2000 units capsule Take 2,000 Units by mouth daily at 12 noon.            Durable Medical Equipment        Start     Ordered   10/08/16 [phone]  For home use only DME 3 n 1  Once     10/08/16 0742       Signed: Hosie Spangle 10/08/2016, 8:54 AM

## 2016-10-17 ENCOUNTER — Other Ambulatory Visit: Payer: Self-pay | Admitting: *Deleted

## 2016-10-17 MED ORDER — GLUCAGON (RDNA) 1 MG IJ KIT: 1.0000 mg | PACK | Freq: Once | INTRAMUSCULAR | 1 refills | Status: DC | PRN

## 2016-10-24 ENCOUNTER — Telehealth: Payer: Self-pay | Admitting: Family Medicine

## 2016-10-25 ENCOUNTER — Other Ambulatory Visit: Payer: BLUE CROSS/BLUE SHIELD

## 2016-10-25 DIAGNOSIS — N400 Enlarged prostate without lower urinary tract symptoms: Secondary | ICD-10-CM | POA: Diagnosis not present

## 2016-10-25 DIAGNOSIS — I10 Essential (primary) hypertension: Secondary | ICD-10-CM

## 2016-10-25 DIAGNOSIS — D696 Thrombocytopenia, unspecified: Secondary | ICD-10-CM

## 2016-10-25 DIAGNOSIS — E1122 Type 2 diabetes mellitus with diabetic chronic kidney disease: Secondary | ICD-10-CM | POA: Diagnosis not present

## 2016-10-25 DIAGNOSIS — Z794 Long term (current) use of insulin: Secondary | ICD-10-CM

## 2016-10-25 DIAGNOSIS — N182 Chronic kidney disease, stage 2 (mild): Secondary | ICD-10-CM

## 2016-10-25 DIAGNOSIS — E559 Vitamin D deficiency, unspecified: Secondary | ICD-10-CM

## 2016-10-25 DIAGNOSIS — E7800 Pure hypercholesterolemia, unspecified: Secondary | ICD-10-CM | POA: Diagnosis not present

## 2016-10-25 DIAGNOSIS — E349 Endocrine disorder, unspecified: Secondary | ICD-10-CM

## 2016-10-26 LAB — NMR, LIPOPROFILE
CHOLESTEROL: 103 mg/dL (ref 100–199)
HDL CHOLESTEROL BY NMR: 49 mg/dL (ref >39)
HDL Particle Number: 28.3 umol/L — ABNORMAL LOW (ref >=30.5)
LDL PARTICLE NUMBER: 474 nmol/L (ref <1000)
LDL Size: 21 nm (ref >20.5)
LDL-C: 43 mg/dL (ref 0–99)
LP-IR Score: <25 (ref <=45)
Small LDL Particle Number: 212 nmol/L (ref <=527)
TRIGLYCERIDES BY NMR: 57 mg/dL (ref 0–149)

## 2016-10-28 LAB — HEPATIC FUNCTION PANEL
ALK PHOS: 80 IU/L (ref 39–117)
ALT: 28 IU/L (ref 0–44)
AST: 33 IU/L (ref 0–40)
Albumin: 4.3 g/dL (ref 3.6–4.8)
Bilirubin Total: 0.6 mg/dL (ref 0.0–1.2)
Bilirubin, Direct: 0.19 mg/dL (ref 0.00–0.40)
Total Protein: 7.4 g/dL (ref 6.0–8.5)

## 2016-10-28 LAB — VITAMIN D 25 HYDROXY (VIT D DEFICIENCY, FRACTURES): VIT D 25 HYDROXY: 49.2 ng/mL (ref 30.0–100.0)

## 2016-10-28 LAB — CBC WITH DIFFERENTIAL/PLATELET
BASOS ABS: 0.1 10*3/uL (ref 0.0–0.2)
Basos: 1 %
EOS (ABSOLUTE): 0.2 10*3/uL (ref 0.0–0.4)
Eos: 3 %
HEMOGLOBIN: 15.3 g/dL (ref 13.0–17.7)
Hematocrit: 47.6 % (ref 37.5–51.0)
Immature Grans (Abs): 0 10*3/uL (ref 0.0–0.1)
Immature Granulocytes: 0 %
LYMPHS ABS: 1.8 10*3/uL (ref 0.7–3.1)
Lymphs: 22 %
MCH: 31.7 pg (ref 26.6–33.0)
MCHC: 32.1 g/dL (ref 31.5–35.7)
MCV: 99 fL — ABNORMAL HIGH (ref 79–97)
Monocytes Absolute: 1.1 10*3/uL — ABNORMAL HIGH (ref 0.1–0.9)
Monocytes: 13 %
NEUTROS ABS: 5.1 10*3/uL (ref 1.4–7.0)
Neutrophils: 61 %
PLATELETS: 208 10*3/uL (ref 150–379)
RBC: 4.82 x10E6/uL (ref 4.14–5.80)
RDW: 14.6 % (ref 12.3–15.4)
WBC: 8.3 10*3/uL (ref 3.4–10.8)

## 2016-10-28 LAB — TESTOSTERONE,FREE AND TOTAL
TESTOSTERONE FREE: 8.3 pg/mL (ref 6.6–18.1)
TESTOSTERONE: 537 ng/dL (ref 264–916)

## 2016-10-28 LAB — BMP8+EGFR
BUN / CREAT RATIO: 15 (ref 10–24)
BUN: 14 mg/dL (ref 8–27)
CHLORIDE: 100 mmol/L (ref 96–106)
CO2: 26 mmol/L (ref 18–29)
Calcium: 9.7 mg/dL (ref 8.6–10.2)
Creatinine, Ser: 0.92 mg/dL (ref 0.76–1.27)
GFR calc non Af Amer: 86 mL/min/{1.73_m2} (ref >59)
GFR, EST AFRICAN AMERICAN: 99 mL/min/{1.73_m2} (ref >59)
Glucose: 74 mg/dL (ref 65–99)
POTASSIUM: 5 mmol/L (ref 3.5–5.2)
SODIUM: 142 mmol/L (ref 134–144)

## 2016-10-28 LAB — PSA, TOTAL AND FREE
PROSTATE SPECIFIC AG, SERUM: 1.4 ng/mL (ref 0.0–4.0)
PSA, Free Pct: 32.1 %
PSA, Free: 0.45 ng/mL

## 2016-11-08 ENCOUNTER — Ambulatory Visit: Payer: Medicare Other | Admitting: Family Medicine

## 2016-11-10 ENCOUNTER — Ambulatory Visit (INDEPENDENT_AMBULATORY_CARE_PROVIDER_SITE_OTHER): Payer: BLUE CROSS/BLUE SHIELD | Admitting: Family Medicine

## 2016-11-10 ENCOUNTER — Encounter: Payer: Self-pay | Admitting: Family Medicine

## 2016-11-10 VITALS — BP 98/56 | HR 73 | Temp 97.7°F | Ht 70.0 in | Wt 258.0 lb

## 2016-11-10 DIAGNOSIS — E559 Vitamin D deficiency, unspecified: Secondary | ICD-10-CM

## 2016-11-10 DIAGNOSIS — D696 Thrombocytopenia, unspecified: Secondary | ICD-10-CM | POA: Diagnosis not present

## 2016-11-10 DIAGNOSIS — E349 Endocrine disorder, unspecified: Secondary | ICD-10-CM

## 2016-11-10 DIAGNOSIS — I10 Essential (primary) hypertension: Secondary | ICD-10-CM | POA: Diagnosis not present

## 2016-11-10 DIAGNOSIS — M48061 Spinal stenosis, lumbar region without neurogenic claudication: Secondary | ICD-10-CM | POA: Diagnosis not present

## 2016-11-10 DIAGNOSIS — Z794 Long term (current) use of insulin: Secondary | ICD-10-CM | POA: Diagnosis not present

## 2016-11-10 DIAGNOSIS — E1122 Type 2 diabetes mellitus with diabetic chronic kidney disease: Secondary | ICD-10-CM | POA: Diagnosis not present

## 2016-11-10 DIAGNOSIS — E7800 Pure hypercholesterolemia, unspecified: Secondary | ICD-10-CM | POA: Diagnosis not present

## 2016-11-10 DIAGNOSIS — R7309 Other abnormal glucose: Secondary | ICD-10-CM | POA: Diagnosis not present

## 2016-11-10 DIAGNOSIS — N182 Chronic kidney disease, stage 2 (mild): Secondary | ICD-10-CM | POA: Diagnosis not present

## 2016-11-10 DIAGNOSIS — N400 Enlarged prostate without lower urinary tract symptoms: Secondary | ICD-10-CM | POA: Diagnosis not present

## 2016-11-10 DIAGNOSIS — E162 Hypoglycemia, unspecified: Secondary | ICD-10-CM | POA: Diagnosis not present

## 2016-11-10 MED ORDER — INSULIN LISPRO 100 UNIT/ML ~~LOC~~ SOLN: 20.0000 [IU] | Freq: Three times a day (TID) | SUBCUTANEOUS | 3 refills | Status: DC

## 2016-11-10 MED ORDER — RANITIDINE HCL 75 MG PO TABS: 75.0000 mg | ORAL_TABLET | Freq: Every day | ORAL | 3 refills | Status: DC

## 2016-11-10 MED ORDER — MONTELUKAST SODIUM 10 MG PO TABS: 10.0000 mg | ORAL_TABLET | Freq: Every day | ORAL | 3 refills | Status: DC

## 2016-11-10 MED ORDER — "INSULIN SYRINGE-NEEDLE U-100 31G X 5/16"" 0.5 ML MISC": 3 refills | Status: DC

## 2016-11-10 MED ORDER — FEXOFENADINE HCL 180 MG PO TABS: 180.0000 mg | ORAL_TABLET | Freq: Every day | ORAL | 3 refills | Status: DC

## 2016-11-10 MED ORDER — TRIAMCINOLONE ACETONIDE 40 MG/ML IJ SUSP: 1.0000 "application " | TOPICAL_CREAM | Freq: Two times a day (BID) | INTRAMUSCULAR | 1 refills | Status: DC

## 2016-11-10 MED ORDER — VALSARTAN 160 MG PO TABS: 160.0000 mg | ORAL_TABLET | Freq: Every day | ORAL | 3 refills | Status: DC

## 2016-11-10 MED ORDER — BD LANCET ULTRAFINE 33G MISC: 3 refills | Status: DC

## 2016-11-10 MED ORDER — PANTOPRAZOLE SODIUM 40 MG PO TBEC: 40.0000 mg | DELAYED_RELEASE_TABLET | Freq: Every day | ORAL | 3 refills | Status: DC

## 2016-11-10 MED ORDER — ATORVASTATIN CALCIUM 40 MG PO TABS: 40.0000 mg | ORAL_TABLET | Freq: Every day | ORAL | 3 refills | Status: DC

## 2016-11-10 MED ORDER — FUROSEMIDE 20 MG PO TABS: 30.0000 mg | ORAL_TABLET | Freq: Every day | ORAL | 3 refills | Status: DC

## 2016-11-10 MED ORDER — INSULIN GLARGINE 100 UNIT/ML ~~LOC~~ SOLN: 75.0000 [IU] | Freq: Two times a day (BID) | SUBCUTANEOUS | 3 refills | Status: DC

## 2016-11-10 NOTE — Progress Notes (Signed)
Subjective:    Patient ID: Tony Cantu, male    DOB: 11/28/1950, 66 y.o.   MRN: [id number]  HPI Pt here for follow up and management of chronic medical problems which includes diabetes, hyperlipidemia and hypertension. He is taking medication regularly.Had a normal white blood cell count with a good hemoglobin at 15.3 and adequate pl he also had a PSA which has been stable at 1.4 and this is being done every 6 months because he is on testosterone. These results will be discussed with him during the visit today. atelet count. All of the patient's liver function tests were normal the end of February. The vitamin D level is good at 49.2. and he ended up having septic shock secondary to a Campylobacter infection. During this time he had atrial fibrillation. He subsequently recovered from this completely and has been diagnosed with bilateral carpal tunnel syndrome and recently had surgery for disc in the low back by the neurosurgeon. He also had an episode of hypoglycemia and ended up in the emergency room for this. His blood sugar today was in the 40-50 range. He has had a tendency to over treat his diabetes and he was called a prescription in for glucagon because of these episodes of hypoglycemia. He is going to have to be a little more lax with letting his blood sugar run a little higher and not have such tight control so we'll we do not keep having these episodes of hypoglycemia. He's had recent lab work done. All his cholesterol numbers were excellent except the good cholesterol was low. The LDL particle number was 474. LDL C was 43. Testosterone levels both free and direct were normal. The blood sugar with the recent BMP on February 27 had a blood sugar of 74 creatinine 0.92 electrolytes were normal. The CBC had a normal white blood cell count and a stable hemoglobin at 15.3 with adequate platelets. All of the patient's liver function tests were normal. The vitamin D level was good. The PSA remains stable  and this was done primarily because of his ongoing use of testosterone. The most recent hemoglobin A1c was done in the hospital and was 5.0. The patient denies any chest pain or shortness of breath. He denies any trouble with his intestinal track including swallowing heartburn indigestion nausea vomiting diarrhea or blood in the stool. He has no trouble with passing his water burning pain or frequency. He is recovering from his back surgery and disc surgery and was supposed to be out walking but because of the weather has not gotten a lot of physical activity.    Patient Active Problem List   Diagnosis Date Noted  . HNP (herniated nucleus pulposus), lumbar 10/06/2016  . Acute renal injury ([place]) 08/06/2016  . Diarrhea 08/06/2016  . Fever 08/06/2016  . Hyperbilirubinemia 08/06/2016  . Septic shock ([place]) 08/06/2016  . Lactic acidosis 08/06/2016  . Type 2 diabetes mellitus with hyperlipidemia ([place]) 01/18/2016  . Inguinal hernia 12/10/2015  . Right groin pain 11/30/2015  . Thrombocytopenia ([place]) 07/21/2014  . Bilateral carotid bruits 05/11/2014  . Vitamin D deficiency 09/17/2013  . BPH (benign prostatic hyperplasia) 05/22/2013  . 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 Diabetes type 2, controlled ([place]) 01/10/2013  . Essential hypertension, benign 01/10/2013  . OSA (obstructive sleep apnea) 05/16/2012  . Edema 02/21/2012  . At risk for coronary artery disease 03/20/2011  . Obesity 03/20/2011  . WEIGHT GAIN, ABNORMAL 11/09/2010  . Hyperlipidemia 06/22/2010  . ALLERGIC RHINITIS 06/22/2010  .  ASTHMA 06/22/2010  . COUGH 06/22/2010   Outpatient Encounter Prescriptions as of 11/10/2016  Medication Sig  . aspirin EC 81 MG tablet Take 81 mg by mouth daily with breakfast.   . atorvastatin (LIPITOR) 40 MG tablet Take 1 tablet (40 mg total) by mouth daily. (Patient taking differently: Take 40 mg by mouth at bedtime. )  . b complex vitamins tablet Take 1 tablet by mouth daily at 12 noon.   .  B-D ULTRA-FINE 33 LANCETS MISC Check Blood sugar 8 times daily and PRN.DX E11.9  . Calcium Carbonate (CALTRATE 600 PO) Take 1 tablet by mouth daily at 12 noon.   . Cholecalciferol (VITAMIN D3) 2000 UNITS capsule Take 2,000 Units by mouth daily at 12 noon.   . fexofenadine (ALLEGRA) 180 MG tablet Take 180 mg by mouth daily with breakfast.   . Flaxseed, Linseed, (FLAX SEED OIL PO) Take 1 capsule by mouth daily with breakfast.   . furosemide (LASIX) 20 MG tablet Take 1.5 tablets (30 mg total) by mouth daily.  Marland Kitchen glucagon (GLUCAGON EMERGENCY) 1 MG injection Inject 1 mg into the skin once as needed.  . Glucosamine-Chondroit-Vit C-Mn (GLUCOSAMINE 1500 COMPLEX) CAPS Take 2 capsules by mouth daily at 12 noon.   Marland Kitchen glucose blood (ONE TOUCH ULTRA TEST) test strip chesk BS q 2 hours PRN.dx.E11.9  . HYDROcodone-acetaminophen (NORCO) 5-325 MG tablet Take 1 tablet by mouth every 6 (six) hours as needed for moderate pain.  Marland Kitchen insulin glargine (LANTUS) 100 UNIT/ML injection Inject 0.75 mLs (75 Units total) into the skin 2 (two) times daily. As directed (Patient taking differently: Inject 50-75 Units into the skin 2 (two) times daily. Inject 50 units in the morning and 75 units at night)  . insulin lispro (HUMALOG) 100 UNIT/ML injection Inject 0.2-0.5 mLs (20-50 Units total) into the skin 3 (three) times daily before meals. Sliding scale (Patient taking differently: Inject 2-32 Units into the skin every 2 (two) hours as needed for high blood sugar. Sliding scale)  . Insulin Syringe-Needle U-100 (BD INSULIN SYRINGE ULTRAFINE) 31G X 5/16" 0.5 ML MISC USE TO INJECT 8 TO 10 TIMES DAILY, dx E11.9  . montelukast (SINGULAIR) 10 MG tablet Take 1 tablet (10 mg total) by mouth at bedtime.  . multivitamin-lutein (OCUVITE-LUTEIN) CAPS capsule Take 1 capsule by mouth daily at 12 noon.  . pantoprazole (PROTONIX) 40 MG tablet Take 1 tablet (40 mg total) by mouth daily.  . ranitidine (ZANTAC) 75 MG tablet Take 75 mg by mouth daily with  breakfast.   . Testosterone (AXIRON) 30 MG/ACT SOLN Place 1 application onto the skin daily. Place 1 application under each axilla daily  . traMADol (ULTRAM) 50 MG tablet Take 1 tablet (50 mg total) by mouth every 6 (six) hours as needed. (Patient taking differently: Take 25-50 mg by mouth every 6 (six) hours as needed for moderate pain (and headaches). )  . valsartan (DIOVAN) 160 MG tablet Take 1 tablet (160 mg total) by mouth daily. (Patient taking differently: Take 160 mg by mouth at bedtime. )  . [DISCONTINUED] MULTIPLE VITAMIN PO Take 1 tablet by mouth daily at 12 noon.    No facility-administered encounter medications on file as of 11/10/2016.      Review of Systems  Constitutional: Negative.   HENT: Negative.   Eyes: Negative.   Respiratory: Negative.   Cardiovascular: Negative.   Gastrointestinal: Negative.   Endocrine: Negative.   Genitourinary: Negative.   Musculoskeletal: Negative.   Skin: Negative.  Skin dryness - back  Allergic/Immunologic: Negative.   Neurological: Negative.   Hematological: Negative.   Psychiatric/Behavioral: Negative.        Objective:   Physical Exam  Constitutional: He is oriented to person, place, and time. He appears well-developed and well-nourished. No distress.  HENT:  Head: Normocephalic and atraumatic.  Right Ear: External ear normal.  Left Ear: External ear normal.  Nose: Nose normal.  Mouth/Throat: Oropharynx is clear and moist. No oropharyngeal exudate.  Eyes: Conjunctivae and EOM are normal. Pupils are equal, round, and reactive to light. Right eye exhibits no discharge. Left eye exhibits no discharge. No scleral icterus.  Neck: Normal range of motion. Neck supple. No thyromegaly present.  Cardiovascular: Normal rate, regular rhythm, normal heart sounds and intact distal pulses.   No murmur heard. The heart is regular today at 72/m  Pulmonary/Chest: Effort normal and breath sounds normal. No respiratory distress. He has no  wheezes. He has no rales. He exhibits no tenderness.  Clear anteriorly and posteriorly and no axillary adenopathy  Abdominal: Soft. Bowel sounds are normal. He exhibits no mass. There is no tenderness. There is no rebound and no guarding.  Abdominal obesity without masses tenderness or organ enlargement.  Musculoskeletal: Normal range of motion. He exhibits no edema.  Lymphadenopathy:    He has no cervical adenopathy.  Neurological: He is alert and oriented to person, place, and time. He has normal reflexes. No cranial nerve deficit.  Skin: Skin is warm and dry. No rash noted.  Somewhat dry and pruritic skin on back.  Psychiatric: He has a normal mood and affect. His behavior is normal. Judgment and thought content normal.  Nursing note and vitals reviewed.  BP (!) 98/56 (BP Location: Left Arm)   Pulse 73   Temp 97.7 F (36.5 C) (Oral)   Ht 5' 10"  (1.778 m)   Wt 258 lb (117 kg)   BMI 37.02 kg/m         Assessment & Plan:  1. 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 He is always had tight control. He cannot seem to adjust his insulin properly to keep the blood sugars from going too low. We will arrange to get a CT scan of the abdomen to further evaluate his abdomen and we'll have him come back and visit with the clinical pharmacist with blood sugar readings from home to make sure that we do not continue to have these episodes of hypoglycemia. - Microalbumin / creatinine urine ratio  2. Essential hypertension, benign -The blood pressure is good today and he will continue with current treatment  3. Pure hypercholesterolemia -All cholesterol numbers were excellent and he will continue with current treatment and therapeutic lifestyle changes  4. Vitamin D deficiency -The vitamin D level was good and he will continue with current treatment  5. Thrombocytopenia (H[facility]) -No bleeding history and we will continue to  monitor this.  6. Benign prostatic hyperplasia, unspecified whether lower urinary tract symptoms present -No urinary tract symptomatology and we will continue to monitor this.  7. Testosterone deficiency -Testosterone levels were good. He would like to switch to a generic topical testosterone and he will discuss this with clinical pharmacy when he comes in for the hypoglycemic evaluation.  8. Type 2 diabetes mellitus with hyperlipidemia ([place]) -Continue with aggressive therapeutic lifestyle changes and always have available snacks and glucagon in case of low blood sugar  9. Spinal stenosis of lumbar region, unspecified  whether neurogenic claudication present -Continue to follow-up with neurosurgery  10. Hypoglycemia -Appointment with diabetic educator for further evaluation and management  Meds ordered this encounter  Medications  . valsartan (DIOVAN) 160 MG tablet    Sig: Take 1 tablet (160 mg total) by mouth daily.    Dispense:  90 tablet    Refill:  3  . ranitidine (ZANTAC) 75 MG tablet    Sig: Take 1 tablet (75 mg total) by mouth daily with breakfast.    Dispense:  90 tablet    Refill:  3  . pantoprazole (PROTONIX) 40 MG tablet    Sig: Take 1 tablet (40 mg total) by mouth daily.    Dispense:  90 tablet    Refill:  3  . montelukast (SINGULAIR) 10 MG tablet    Sig: Take 1 tablet (10 mg total) by mouth at bedtime.    Dispense:  90 tablet    Refill:  3  . Insulin Syringe-Needle U-100 (BD INSULIN SYRINGE ULTRAFINE) 31G X 5/16" 0.5 ML MISC    Sig: USE TO INJECT 8 TO 10 TIMES DAILY, dx E11.9    Dispense:  700 each    Refill:  3  . insulin lispro (HUMALOG) 100 UNIT/ML injection    Sig: Inject 0.2-0.5 mLs (20-50 Units total) into the skin 3 (three) times daily before meals. Sliding scale    Dispense:  12 vial    Refill:  3  . insulin glargine (LANTUS) 100 UNIT/ML injection    Sig: Inject 0.75 mLs (75 Units total) into the skin 2 (two) times daily. As directed    Dispense:  12  vial    Refill:  3  . furosemide (LASIX) 20 MG tablet    Sig: Take 1.5 tablets (30 mg total) by mouth daily.    Dispense:  135 tablet    Refill:  3  . fexofenadine (ALLEGRA) 180 MG tablet    Sig: Take 1 tablet (180 mg total) by mouth daily with breakfast.    Dispense:  90 tablet    Refill:  3  . B-D ULTRA-FINE 33 LANCETS MISC    Sig: Check Blood sugar 8 times daily and PRN.DX E11.9    Dispense:  700 each    Refill:  3  . atorvastatin (LIPITOR) 40 MG tablet    Sig: Take 1 tablet (40 mg total) by mouth daily.    Dispense:  90 tablet    Refill:  3  . triamcinolone acetonide 40 MG/ML SUSP 40 mg, mupirocin cream 2 % CREA 15 application    Sig: Apply 1 application topically 2 (two) times daily.    Dispense:  30 g    Refill:  1   Patient Instructions                       Medicare Annual Wellness Visit  [facility] and the medical providers at [facility] strive to bring you the best medical care.  In doing so we not only want to address your current medical conditions and concerns but also to detect new conditions early and prevent illness, disease and health-related problems.    Medicare offers a yearly Wellness Visit which allows our clinical staff to assess your need for preventative services including immunizations, lifestyle education, counseling to decrease risk of preventable diseases and screening for fall risk and other medical concerns.    This visit is provided free of charge (no copay) for all Medicare recipients.  The clinical pharmacists at [facility] have begun to conduct these Wellness Visits which will also include a thorough review of all your medications.    As you primary medical provider recommend that you make an appointment for your Annual Wellness Visit if you have not done so already this year.  You may set up this appointment before you leave today or you may call back ([phone]) and schedule an appointment.  Please  make sure when you call that you mention that you are scheduling your Annual Wellness Visit with the clinical pharmacist so that the appointment may be made for the proper length of time.     Continue current medications. Continue good therapeutic lifestyle changes which include good diet and exercise. Fall precautions discussed with patient. If an FOBT was given today- please return it to our front desk. If you are over 57 years old - you may need Prevnar 46 or the adult Pneumonia vaccine.  **Flu shots are available--- please call and schedule a FLU-CLINIC appointment**  After your visit with [place] today you will receive a survey in the mail or online from [employer] regarding your care with [place]. Please take a moment to fill this out. Your feedback is very important to [place] as you can help [place] better understand your patient needs as well as improve your experience and satisfaction. WE CARE ABOUT YOU!!!   We will arrange for you to have a visit with the clinical pharmacist to adjust your insulin so that we do not have these continued episodes of hypoglycemia We will also attempt to get a CT scan of your abdomen to evaluate the pancreas because of episodes of hypoglycemia Bring home blood sugar readings with you to the clinical pharmacy visit Make sure that you had a bedtime snack and that you had snacks between meals to keep blood sugar from dropping too low. Follow-up with neurosurgery as planned Follow-up with cardiology as planned Please make sure that the patient is using scent free fabric softeners detergents and soaps.  Arrie Senate MD

## 2016-11-10 NOTE — Patient Instructions (Addendum)
Medicare Annual Wellness Visit  [facility] and the medical providers at [facility] strive to bring you the best medical care.  In doing so we not only want to address your current medical conditions and concerns but also to detect new conditions early and prevent illness, disease and health-related problems.    Medicare offers a yearly Wellness Visit which allows our clinical staff to assess your need for preventative services including immunizations, lifestyle education, counseling to decrease risk of preventable diseases and screening for fall risk and other medical concerns.    This visit is provided free of charge (no copay) for all Medicare recipients. The clinical pharmacists at [facility] have begun to conduct these Wellness Visits which will also include a thorough review of all your medications.    As you primary medical provider recommend that you make an appointment for your Annual Wellness Visit if you have not done so already this year.  You may set up this appointment before you leave today or you may call back ([phone]) and schedule an appointment.  Please make sure when you call that you mention that you are scheduling your Annual Wellness Visit with the clinical pharmacist so that the appointment may be made for the proper length of time.     Continue current medications. Continue good therapeutic lifestyle changes which include good diet and exercise. Fall precautions discussed with patient. If an FOBT was given today- please return it to our front desk. If you are over 67 years old - you may need Prevnar 9 or the adult Pneumonia vaccine.  **Flu shots are available--- please call and schedule a FLU-CLINIC appointment**  After your visit with [place] today you will receive a survey in the mail or online from [employer] regarding your care with [place]. Please take a moment to fill this out. Your feedback is very  important to [place] as you can help [place] better understand your patient needs as well as improve your experience and satisfaction. WE CARE ABOUT YOU!!!   We will arrange for you to have a visit with the clinical pharmacist to adjust your insulin so that we do not have these continued episodes of hypoglycemia We will also attempt to get a CT scan of your abdomen to evaluate the pancreas because of episodes of hypoglycemia Bring home blood sugar readings with you to the clinical pharmacy visit Make sure that you had a bedtime snack and that you had snacks between meals to keep blood sugar from dropping too low. Follow-up with neurosurgery as planned Follow-up with cardiology as planned Please make sure that the patient is using scent free fabric softeners detergents and soaps.

## 2016-11-11 ENCOUNTER — Encounter: Payer: Self-pay | Admitting: Family Medicine

## 2016-11-11 LAB — MICROALBUMIN / CREATININE URINE RATIO: Creatinine, Urine: 61.4 mg/dL

## 2016-11-15 MED ORDER — VALSARTAN 160 MG PO TABS: 160.0000 mg | ORAL_TABLET | Freq: Every day | ORAL | 3 refills | Status: DC

## 2016-11-15 MED ORDER — PANTOPRAZOLE SODIUM 40 MG PO TBEC: 40.0000 mg | DELAYED_RELEASE_TABLET | Freq: Every day | ORAL | 3 refills | Status: DC

## 2016-11-15 NOTE — Addendum Note (Signed)
Addended by: Wardell Heath on: 11/15/2016 11:51 AM   Modules accepted: Orders

## 2016-11-16 ENCOUNTER — Other Ambulatory Visit: Payer: Self-pay

## 2016-11-16 MED ORDER — VALSARTAN 160 MG PO TABS: 160.0000 mg | ORAL_TABLET | Freq: Every day | ORAL | 3 refills | Status: DC

## 2016-11-16 MED ORDER — ATORVASTATIN CALCIUM 40 MG PO TABS: 40.0000 mg | ORAL_TABLET | Freq: Every day | ORAL | 3 refills | Status: DC

## 2016-11-16 MED ORDER — TRIAMCINOLONE ACETONIDE 40 MG/ML IJ SUSP: 1.0000 "application " | TOPICAL_CREAM | Freq: Two times a day (BID) | INTRAMUSCULAR | 1 refills | Status: DC

## 2016-11-16 MED ORDER — PANTOPRAZOLE SODIUM 40 MG PO TBEC: 40.0000 mg | DELAYED_RELEASE_TABLET | Freq: Every day | ORAL | 3 refills | Status: DC

## 2016-11-16 MED ORDER — FUROSEMIDE 20 MG PO TABS: 30.0000 mg | ORAL_TABLET | Freq: Every day | ORAL | 3 refills | Status: DC

## 2016-11-16 MED ORDER — "INSULIN SYRINGE-NEEDLE U-100 31G X 5/16"" 0.5 ML MISC": 3 refills | Status: DC

## 2016-11-16 MED ORDER — MONTELUKAST SODIUM 10 MG PO TABS: 10.0000 mg | ORAL_TABLET | Freq: Every day | ORAL | 3 refills | Status: DC

## 2016-11-16 MED ORDER — RANITIDINE HCL 75 MG PO TABS: 75.0000 mg | ORAL_TABLET | Freq: Every day | ORAL | 3 refills | Status: DC

## 2016-11-16 MED ORDER — FEXOFENADINE HCL 180 MG PO TABS: 180.0000 mg | ORAL_TABLET | Freq: Every day | ORAL | 3 refills | Status: DC

## 2016-11-16 MED ORDER — INSULIN GLARGINE 100 UNIT/ML ~~LOC~~ SOLN: 75.0000 [IU] | Freq: Two times a day (BID) | SUBCUTANEOUS | 3 refills | Status: DC

## 2016-11-16 MED ORDER — BD LANCET ULTRAFINE 33G MISC: 3 refills | Status: DC

## 2016-11-16 MED ORDER — INSULIN LISPRO 100 UNIT/ML ~~LOC~~ SOLN: 20.0000 [IU] | Freq: Three times a day (TID) | SUBCUTANEOUS | 3 refills | Status: DC

## 2016-11-18 ENCOUNTER — Telehealth: Payer: Self-pay | Admitting: Family Medicine

## 2016-11-18 NOTE — Telephone Encounter (Signed)
Tony Cantu, [place].Marland Kitchen

## 2016-11-22 ENCOUNTER — Ambulatory Visit (HOSPITAL_COMMUNITY)
Admission: RE | Admit: 2016-11-22 | Discharge: 2016-11-22 | Disposition: A | Discharge status: Home / Self Care | Payer: BLUE CROSS/BLUE SHIELD | Source: Ambulatory Visit | Attending: Family Medicine | Admitting: Family Medicine

## 2016-11-22 DIAGNOSIS — E1122 Type 2 diabetes mellitus with diabetic chronic kidney disease: Secondary | ICD-10-CM | POA: Diagnosis not present

## 2016-11-22 DIAGNOSIS — K7460 Unspecified cirrhosis of liver: Secondary | ICD-10-CM | POA: Insufficient documentation

## 2016-11-22 DIAGNOSIS — E162 Hypoglycemia, unspecified: Secondary | ICD-10-CM

## 2016-11-22 DIAGNOSIS — I700 Atherosclerosis of aorta: Secondary | ICD-10-CM | POA: Insufficient documentation

## 2016-11-22 DIAGNOSIS — N182 Chronic kidney disease, stage 2 (mild): Secondary | ICD-10-CM | POA: Insufficient documentation

## 2016-11-22 DIAGNOSIS — M5136 Other intervertebral disc degeneration, lumbar region: Secondary | ICD-10-CM | POA: Insufficient documentation

## 2016-11-22 DIAGNOSIS — Z794 Long term (current) use of insulin: Secondary | ICD-10-CM | POA: Insufficient documentation

## 2016-11-22 DIAGNOSIS — R7309 Other abnormal glucose: Secondary | ICD-10-CM

## 2016-11-23 ENCOUNTER — Other Ambulatory Visit: Payer: Self-pay | Admitting: *Deleted

## 2016-11-23 ENCOUNTER — Encounter: Payer: Self-pay | Admitting: Family Medicine

## 2016-11-23 DIAGNOSIS — I700 Atherosclerosis of aorta: Secondary | ICD-10-CM | POA: Insufficient documentation

## 2016-11-23 DIAGNOSIS — K7460 Unspecified cirrhosis of liver: Secondary | ICD-10-CM

## 2016-11-28 ENCOUNTER — Encounter: Payer: Self-pay | Admitting: Pharmacist

## 2016-11-28 ENCOUNTER — Ambulatory Visit (INDEPENDENT_AMBULATORY_CARE_PROVIDER_SITE_OTHER): Payer: BLUE CROSS/BLUE SHIELD | Admitting: Pharmacist

## 2016-11-28 VITALS — BP 132/72 | HR 64 | Ht 70.0 in | Wt 260.0 lb

## 2016-11-28 DIAGNOSIS — E10649 Type 1 diabetes mellitus with hypoglycemia without coma: Secondary | ICD-10-CM

## 2016-11-28 NOTE — Patient Instructions (Signed)
Change Lantus to 50 units each morning and 73 units each evening.   After 1 week if you are still having blood glucose readings in 50's and 60's in the morning then decrease evening dose to 70 units once a day.  Humalog - continue to inject 20 to 30 units prior to meals based on blood glucose reading.  Remember to take no more than 15 minutes prior to meal.     Diabetes and Standards of Medical Care   Diabetes is complicated. You may find that your diabetes team includes a dietitian, nurse, diabetes educator, eye doctor, and more. To help everyone know what is going on and to help you get the care you deserve, the following schedule of care was developed to help keep you on track. Below are the tests, exams, vaccines, medicines, education, and plans you will need.  Blood Glucose Goals Prior to meals = 70 - 120 Within 2 hours of the start of a meal = less than 140 to 180  HbA1c test (goal is less than 6.5% - your last value was 5.0%) This test shows how well you have controlled your glucose over the past 2 to 3 months. It is used to see if your diabetes management plan needs to be adjusted.   It is performed at least 2 times a year if you are meeting treatment goals.  It is performed 4 times a year if therapy has changed or if you are not meeting treatment goals.  Blood pressure test  This test is performed at every routine medical visit. The goal is less than 140/90 mmHg for most people, but 130/80 mmHg in some cases. Ask your health care provider about your goal.  Dental exam  Follow up with the dentist regularly.  Eye exam  If you are diagnosed with type 1 diabetes as a child, get an exam upon reaching the age of 66 years or older and have had diabetes for 3 to 5 years. Yearly eye exams are recommended after that initial eye exam.  If you are diagnosed with type 1 diabetes as an adult, get an exam within 5 years of diagnosis and then yearly.  If you are diagnosed with type 2  diabetes, get an exam as soon as possible after the diagnosis and then yearly.  Foot care exam  Visual foot exams are performed at every routine medical visit. The exams check for cuts, injuries, or other problems with the feet.  A comprehensive foot exam should be done yearly. This includes visual inspection as well as assessing foot pulses and testing for loss of sensation.  Check your feet nightly for cuts, injuries, or other problems with your feet. Tell your health care provider if anything is not healing.  Kidney function test (urine microalbumin)  This test is performed once a year.  Type 1 diabetes: The first test is performed 5 years after diagnosis.  Type 2 diabetes: The first test is performed at the time of diagnosis.  A serum creatinine and estimated glomerular filtration rate (eGFR) test is done once a year to assess the level of chronic kidney disease (CKD), if present.  Lipid profile (cholesterol, HDL, LDL, triglycerides)  Performed every 5 years for most people.  The goal for LDL is less than 100 mg/dL. If you are at high risk, the goal is less than 70 mg/dL.  The goal for HDL is 40 mg/dL to 50 mg/dL for men and 50 mg/dL to 60 mg/dL for women. An HDL  cholesterol of 60 mg/dL or higher gives some protection against heart disease.  The goal for triglycerides is less than 150 mg/dL.  Influenza vaccine, pneumococcal vaccine, and hepatitis B vaccine  The influenza vaccine is recommended yearly.  The pneumococcal vaccine is generally given once in a lifetime. However, there are some instances when another vaccination is recommended. Check with your health care provider.  The hepatitis B vaccine is also recommended for adults with diabetes.  Diabetes self-management education  Education is recommended at diagnosis and ongoing as needed.  Treatment plan  Your treatment plan is reviewed at every medical visit.  Document Released: 06/12/2009 Document Revised:  04/17/2013 Document Reviewed: 01/15/2013 [facility] Patient Information 2014 [place].

## 2016-11-28 NOTE — Progress Notes (Signed)
Patient ID: Tony Cantu, male   DOB: [birth year], 66 y.o.   MRN: [id number]   Subjective:    Tony Cantu is a 66 y.o. male who presents for an initial evaluation of Type 1 diabetes mellitus. Tony Cantu was referred by his PCP due to increased episodes of hypoglycemia. Tony Cantu was had hypoglycemic episode which required EMS.  During this episode his wife who is an Therapist, sports witnessed him has a seizure related to hypoglycemia.     Current symptoms/problems include hypoglycemia  and have been improving a little since 09/26/2016. Patient states he has not been as aggressive and A1c was increased from 4.7% (06/07/16) to 5.0% (09/2016) Symptoms have been present for at least 3 months.  The patient was initially diagnosed with Type 2 DM and started on oral medications at age 18yo and quickly progressed over the first year to require insulin.    Current insulin regimen is:  Lantus 50 units qam and 75 units qpm.  Humalog 10 to 30 units prior to meals and as needed for elevated BG.  Uses Humalog 3 to 6 times a day.  When reviewing HBG records with patient he mentions that one low BG occurred because his BG was 120 about 1 hour prior to supper and he knew it was going to increase so he gave 4 units.  Next dose was in 1 hour when he gave insulin based on CHO amount of meal.  He does not use insulin to CHO ratio or correction factor but knows from years of experience how much insulin to give.    Checks BG 4 to 8 times a day.   For March 2018:  Am average = 82 After breakfast = 93 Before lunch = 80 After lunch = 108 Before supper = 95 After supper = 101 Bedtime = 51  Known diabetic complications: cardiovascular disease Cardiovascular risk factors: advanced age (older than 63 for men, 52 for women), diabetes mellitus, dyslipidemia, hypertension, male gender and obesity (BMI >= 30 kg/m2)  Eye exam current (within one year): yes Weight trend: stable Prior visit with CDE: no Current diet: in general, a  "healthy" diet   Current exercise: none Medication Compliance?  Yes    Objective:    BP 132/72   Pulse 64   Ht 5' 10"  (1.778 m)   Wt 260 lb (117.9 kg)   BMI 37.31 kg/m   Lab Review Glucose (mg/dL)  Date Value  10/25/2016 74  08/12/2016 187 (H)   Glucose, Bld (mg/dL)  Date Value  10/04/2016 58 (L)  08/08/2016 219 (H)  08/07/2016 154 (H)   CO2 (mmol/L)  Date Value  10/25/2016 26  10/04/2016 25  08/12/2016 22   BUN (mg/dL)  Date Value  10/25/2016 14  10/04/2016 8  08/12/2016 11  08/08/2016 13  08/07/2016 14  06/07/2016 14   Creat (mg/dL)  Date Value  01/07/2013 0.82   Creatinine, Ser (mg/dL)  Date Value  10/25/2016 0.92  10/04/2016 0.85  08/12/2016 0.84    Assessment:    Diabetes Mellitus type 1 with recent history of hypoglycemic event requiring medical attention Plan:    1.  Rx changes: Decrease Lantus to 50 units qam and 72 units qpm.  If after 1 week he is still getting BG in 50's or 60's then decrease pm Lantus to 70 units daily.   Also discussed stacking effect of taking short acting insulin doses too close together.  Patient is instructed not correct for BG if less  than 150 within 1 hour of a meal.    He is to record 3-4 days of BG, food and amount of insulin given to assist in determining set CHO ratio and correction factor.  Discussed insulin pump but patient is not interested at this time. 2.  Education: Reviewed 'ABCs' of diabetes management (respective goals in parentheses):  A1C (<7), blood pressure (<130/80), and cholesterol (LDL <100). 3. Discussed newer options for HBG.  Discussed personal CGM and pamphlet given but patient declined.  Also recommended 14 day professional CGM but patient also declined.  He doesn't mind checking BG and is concerned about CGM coming off when he sweats.   4. CHO counting diet discussed.  Reviewed CHO amount in various foods and how to read nutrition labels.  Discussed recommended serving sizes.  5.  Recommend  check BG 4 To 8  times a day 6.  Recommended increase physical activity - goal is 150 minutes per week 7. Follow up: 4 weeks

## 2016-11-29 ENCOUNTER — Other Ambulatory Visit: Payer: Self-pay | Admitting: Pharmacist

## 2016-11-29 MED ORDER — TESTOSTERONE 30 MG/ACT TD SOLN: TRANSDERMAL | 1 refills | Status: DC

## 2016-12-06 ENCOUNTER — Encounter: Payer: Self-pay | Admitting: Family Medicine

## 2016-12-08 ENCOUNTER — Other Ambulatory Visit: Payer: Self-pay | Admitting: *Deleted

## 2016-12-08 MED ORDER — BD LANCET ULTRAFINE 33G MISC: 1 refills | Status: DC

## 2016-12-08 MED ORDER — GLUCOSE BLOOD VI STRP: ORAL_STRIP | 1 refills | Status: DC

## 2016-12-12 ENCOUNTER — Telehealth: Payer: Self-pay | Admitting: Family Medicine

## 2016-12-12 ENCOUNTER — Encounter: Payer: Self-pay | Admitting: Family Medicine

## 2016-12-12 NOTE — Telephone Encounter (Signed)
Pt called - he said this was taken care of with Jackelyn Poling today

## 2016-12-12 NOTE — Telephone Encounter (Signed)
Called patient back to get more information- patient states he needs to only speak to [facility] about his prior auth of testosterone.

## 2016-12-13 DIAGNOSIS — K7460 Unspecified cirrhosis of liver: Secondary | ICD-10-CM | POA: Diagnosis not present

## 2016-12-21 ENCOUNTER — Encounter: Payer: Self-pay | Admitting: Family Medicine

## 2017-01-03 ENCOUNTER — Encounter: Payer: Self-pay | Admitting: Pulmonary Disease

## 2017-01-03 ENCOUNTER — Ambulatory Visit (INDEPENDENT_AMBULATORY_CARE_PROVIDER_SITE_OTHER): Payer: BLUE CROSS/BLUE SHIELD | Admitting: Pulmonary Disease

## 2017-01-03 VITALS — BP 116/80 | HR 60 | Ht 70.0 in | Wt 262.0 lb

## 2017-01-03 DIAGNOSIS — G4733 Obstructive sleep apnea (adult) (pediatric): Secondary | ICD-10-CM | POA: Diagnosis not present

## 2017-01-03 NOTE — Progress Notes (Signed)
Current Outpatient Prescriptions on File Prior to Visit  Medication Sig  . aspirin EC 81 MG tablet Take 81 mg by mouth daily with breakfast.   . atorvastatin (LIPITOR) 40 MG tablet Take 1 tablet (40 mg total) by mouth daily.  Marland Kitchen b complex vitamins tablet Take 1 tablet by mouth daily at 12 noon.   . B-D ULTRA-FINE 33 LANCETS MISC Check Blood sugar 8 times daily and PRN.DX E11.9  . Calcium Carbonate (CALTRATE 600 PO) Take 1 tablet by mouth daily at 12 noon.   . Cholecalciferol (VITAMIN D3) 2000 UNITS capsule Take 2,000 Units by mouth daily at 12 noon.   . fexofenadine (ALLEGRA) 180 MG tablet Take 1 tablet (180 mg total) by mouth daily with breakfast.  . Flaxseed, Linseed, (FLAX SEED OIL PO) Take 1 capsule by mouth daily with breakfast.   . furosemide (LASIX) 20 MG tablet Take 1.5 tablets (30 mg total) by mouth daily.  Marland Kitchen glucagon (GLUCAGON EMERGENCY) 1 MG injection Inject 1 mg into the skin once as needed.  . Glucosamine-Chondroit-Vit C-Mn (GLUCOSAMINE 1500 COMPLEX) CAPS Take 2 capsules by mouth daily at 12 noon.   Marland Kitchen glucose blood (ONE TOUCH ULTRA TEST) test strip chesk BS q 2 hours PRN.dx.E11.9  . insulin glargine (LANTUS) 100 UNIT/ML injection Inject 0.75 mLs (75 Units total) into the skin 2 (two) times daily. As directed  . insulin lispro (HUMALOG) 100 UNIT/ML injection Inject 0.2-0.5 mLs (20-50 Units total) into the skin 3 (three) times daily before meals. Sliding scale  . Insulin Syringe-Needle U-100 (BD INSULIN SYRINGE ULTRAFINE) 31G X 5/16" 0.5 ML MISC USE TO INJECT 8 TO 10 TIMES DAILY, dx E11.9  . montelukast (SINGULAIR) 10 MG tablet Take 1 tablet (10 mg total) by mouth at bedtime.  . multivitamin-lutein (OCUVITE-LUTEIN) CAPS capsule Take 1 capsule by mouth daily at 12 noon.  . pantoprazole (PROTONIX) 40 MG tablet Take 1 tablet (40 mg total) by mouth daily.  . ranitidine (ZANTAC) 75 MG tablet Take 1 tablet (75 mg total) by mouth daily with breakfast.  . Testosterone 30 MG/ACT SOLN Use one  (1) actuation under each axilla daily (= total of 20m daily)  . traMADol (ULTRAM) 50 MG tablet Take 1 tablet (50 mg total) by mouth every 6 (six) hours as needed.  . triamcinolone acetonide 40 MG/ML SUSP 40 mg, mupirocin cream 2 % CREA 15 application Apply 1 application topically 2 (two) times daily.  . valsartan (DIOVAN) 160 MG tablet Take 1 tablet (160 mg total) by mouth daily.   No current facility-administered medications on file prior to visit.      Chief Complaint  Patient presents with  . Follow-up    Wears CPAP nightly. Denies any issues with pressure. Mask seems to not fit 100%, seems to have stretched. DME: [facility]   Pulmonary tests PFT 02/09/16 >> FEV1 2.38 (70%), FEV1% 84, TLC 4.96 (70%), DLCO 78%  Sleep tests PSG 05/02/12 >> AHI 76 Auto CPAP 10/05/16 to 01/02/17 >> used on 87 of 90 nights with average 7 hrs 17 min.  Average AHI 1.7 with median CPAP 17 and 95 th percentile CPAP 20 cm H2O  Cardiac tests Echo 08/09/16 >> EF 60 to 65%  Past medical history Asthma, BPH, Depression, DM, GERD, HTN, Cirrhosis  Past surgical history, Family history, Social history, Allergies all reviewed.  Vital Signs BP 116/80 (BP Location: Left Arm, Cuff Size: Normal)   Pulse 60   Ht 5' 10"  (1.778 m)   Wt 262 lb (118.8  kg)   SpO2 96%   BMI 37.59 kg/m   History of Present Illness Tony Cantu is a 66 y.o. male with OSA.  He is doing well with CPAP.  Mask shifts sometimes.  He is otherwise sleeping well.  Physical Exam  General - No distress ENT - No sinus tenderness, no oral exudate, no LAN Cardiac - s1s2 regular, no murmur Chest - No wheeze/rales/dullness Back - No focal tenderness Abd - Soft, non-tender Ext - No edema Neuro - Normal strength Skin - No rashes Psych - normal mood, and behavior   Assessment/Plan  Obstructive sleep apnea. - he is compliant with CPAP and reports benefit from therapy - continue auto CPAP   Patient Instructions  Follow up in 1  year    Chesley Mires, MD [place] Pager:  [phone] 01/03/2017, 10:09 AM

## 2017-01-03 NOTE — Patient Instructions (Signed)
Follow up in 1 year.

## 2017-01-10 NOTE — Progress Notes (Signed)
`      Cardiology Office Note   Date:  01/11/2017   ID:  Tony FRUTIGER, DOB 12/27/1950, MRN [id number]  PCP:  Chipper Herb, MD  Cardiologist:   Minus Breeding, MD  Referring:  Chipper Herb, MD  Chief Complaint  Patient presents with  . Irregular Heart Beat      History of Present Illness: Tony Cantu is a 66 y.o. male who presents for evaluation of arrhythmia.   He was previously seen by Dr. Aundra Dubin.  He had a cardiolite in 2009 that was a normal study. He uses CPAP for his sleep apnea. He has mild carotid disease.  The patient was admitted previously  with sepsis.  He had new onset atrial fib with RVR.  He was treated with Xarelto.  However, his wife is a Marine scientist and she said she never saw atrial fibrillation when she was watching. I reviewed all of the rhythm strips and EKGs and could not find evidence of atrial fibrillation. The most recent EKG demonstrated sinus rhythm.  At the last visit I stopped Eliquis and beta blocker.   He was put back on Valsartan for his BP.  Since I saw him he had back surgery and is starting to get more mobile.  He has had some swelling in his feet with the hot weather.  The patient denies any new symptoms such as chest discomfort, neck or arm discomfort. There has been no new shortness of breath, PND or orthopnea. There have been no reported palpitations, presyncope or syncope.   Past Medical History:  Diagnosis Date  . Arthritis   . Asthma   . BPH (benign prostatic hypertrophy)   . Colon polyps   . Depression    no meds  . Diabetes mellitus without complication ([place])   . Gastritis   . GERD (gastroesophageal reflux disease)   . Headache    HX  . Hypertension   . PONV (postoperative nausea and vomiting)   . Seizures ([place])    HYPOGLYCEMIC  . Sleep apnea    uses CPAP nightly  . Testicle trouble    one testicle    Past Surgical History:  Procedure Laterality Date  . BACK SURGERY     94  . CARPAL TUNNEL RELEASE Right 10/13/2015   Procedure: RIGHT CARPAL TUNNEL RELEASE;  Surgeon: Daryll Brod, MD;  Location: [facility];  Service: Orthopedics;  Laterality: Right;  . CARPAL TUNNEL RELEASE Left 07/05/2016   Procedure: LEFT CARPAL TUNNEL RELEASE;  Surgeon: Daryll Brod, MD;  Location: [facility];  Service: Orthopedics;  Laterality: Left;  . HERNIA REPAIR    . INGUINAL HERNIA REPAIR  2003   right   . KNEE ARTHROSCOPY Right 03/03/2016  . LUMBAR DISC SURGERY  8/96   Dr. Coralyn Mark, discectomy  . LUMBAR LAMINECTOMY/DECOMPRESSION MICRODISCECTOMY Right 10/06/2016   Procedure: RIGHT LUMBAR THREE - LUMBAR FOUR  LAMINECTOMY, FORAMINOTOMY AND MICRODISCECTOMY;  Surgeon: Jovita Gamma, MD;  Location: [facility];  Service: Neurosurgery;  Laterality: Right;  . SHOULDER SURGERY  11/28/05   left partial  . SHOULDER SURGERY  07/14/2006   RIGHT  . TONSILECTOMY, ADENOIDECTOMY, BILATERAL MYRINGOTOMY AND TUBES     NO TUBES      Current Outpatient Prescriptions  Medication Sig Dispense Refill  . aspirin EC 81 MG tablet Take 81 mg by mouth daily with breakfast.     . atorvastatin (LIPITOR) 40 MG tablet Take 1 tablet (40 mg total) by mouth daily.  90 tablet 3  . b complex vitamins tablet Take 1 tablet by mouth daily at 12 noon.     . Calcium Carbonate (CALTRATE 600 PO) Take 1 tablet by mouth daily at 12 noon.     . Cholecalciferol (VITAMIN D3) 2000 UNITS capsule Take 2,000 Units by mouth daily at 12 noon.     . fexofenadine (ALLEGRA) 180 MG tablet Take 1 tablet (180 mg total) by mouth daily with breakfast. 90 tablet 3  . Flaxseed, Linseed, (FLAX SEED OIL PO) Take 1 capsule by mouth daily with breakfast.     . furosemide (LASIX) 20 MG tablet Take 1.5 tablets (30 mg total) by mouth daily. 135 tablet 3  . glucagon (GLUCAGON EMERGENCY) 1 MG injection Inject 1 mg into the skin once as needed. 3 each 1  . Glucosamine-Chondroit-Vit C-Mn (GLUCOSAMINE 1500 COMPLEX) CAPS Take 2 capsules by mouth daily at 12 noon.     . insulin  glargine (LANTUS) 100 UNIT/ML injection Inject 0.6 mLs (60 Units total) into the skin 2 (two) times daily. As directed 12 vial 3  . insulin lispro (HUMALOG) 100 UNIT/ML injection Inject 0.2-0.5 mLs (20-50 Units total) into the skin 3 (three) times daily before meals. Sliding scale 12 vial 3  . montelukast (SINGULAIR) 10 MG tablet Take 1 tablet (10 mg total) by mouth at bedtime. 90 tablet 3  . multivitamin-lutein (OCUVITE-LUTEIN) CAPS capsule Take 1 capsule by mouth daily at 12 noon.    . pantoprazole (PROTONIX) 40 MG tablet Take 1 tablet (40 mg total) by mouth daily. 90 tablet 3  . ranitidine (ZANTAC) 75 MG tablet Take 1 tablet (75 mg total) by mouth daily with breakfast. 90 tablet 3  . Testosterone 30 MG/ACT SOLN Use one (1) actuation under each axilla daily (= total of 63m daily) 270 mL 1  . traMADol (ULTRAM) 50 MG tablet Take 1 tablet (50 mg total) by mouth every 6 (six) hours as needed. 30 tablet 0  . triamcinolone acetonide 40 MG/ML SUSP 40 mg, mupirocin cream 2 % CREA 15 application Apply 1 application topically 2 (two) times daily. 30 g 1  . valsartan (DIOVAN) 160 MG tablet Take 1 tablet (160 mg total) by mouth daily. 90 tablet 3  . B-D ULTRA-FINE 33 LANCETS MISC Check Blood sugar 8 times daily and PRN.DX E11.9 800 each 1  . glucose blood (ONE TOUCH ULTRA TEST) test strip chesk BS q 2 hours PRN.dx.E11.9 800 each 1  . Insulin Syringe-Needle U-100 (BD INSULIN SYRINGE ULTRAFINE) 31G X 5/16" 0.5 ML MISC USE TO INJECT 8 TO 10 TIMES DAILY, dx E11.9 700 each 3   No current facility-administered medications for this visit.     Allergies:   Erythromycin and Sulfonamide derivatives     ROS:  Please see the history of present illness.   Otherwise, review of systems are positive for back pain.   All other systems are reviewed and negative.    PHYSICAL EXAM: VS:  BP 100/68   Pulse 66   Ht 5' 10"  (1.778 m)   Wt 258 lb (117 kg)   BMI 37.02 kg/m  , BMI Body mass index is 37.02 kg/m.  GENERAL:   Well appearing NECK:  No jugular venous distention, waveform within normal limits, carotid upstroke brisk and symmetric, no bruits, no thyromegaly LUNGS:  Clear to auscultation bilaterally BACK:  No CVA tenderness CHEST:  Unremarkable HEART:  PMI not displaced or sustained,S1 and S2 within normal limits, no S3, no S4, no clicks,  no rubs, 2/6 apical systolic murmur brief and mildly radiating, no diastolic murmurs ABD:  Flat, positive bowel sounds normal in frequency in pitch, no bruits, no rebound, no guarding, no midline pulsatile mass, no hepatomegaly, no splenomegaly EXT:  2 plus pulses throughout, trace edema, no cyanosis no clubbing  EKG:  EKG is  ordered today. The ekg ordered today demonstrates sinus rhythm, rate 67 , right bundle branch block, no acute ST-T wave changes.   Recent Labs: 04/21/2016: BNP 10.6 08/07/2016: TSH 1.061 08/08/2016: Magnesium 2.0 10/04/2016: Hemoglobin 16.2 10/25/2016: ALT 28; BUN 14; Creatinine, Ser 0.92; Platelets 208; Potassium 5.0; Sodium 142    Lipid Panel    Component Value Date/Time   CHOL 103 10/25/2016 0815   CHOL 107 01/07/2013 0840   TRIG 57 10/25/2016 0815   TRIG 47 01/07/2013 0840   HDL 49 10/25/2016 0815   HDL 53 01/07/2013 0840   LDLCALC 44 01/03/2014 0803   LDLCALC 45 01/07/2013 0840     Lab Results  Component Value Date   HGBA1C 5.0 10/04/2016    Wt Readings from Last 3 Encounters:  01/11/17 258 lb (117 kg)  01/11/17 259 lb (117.5 kg)  01/03/17 262 lb (118.8 kg)      Other studies Reviewed: Additional studies/ records that were reviewed today include: None Review of the above records demonstrates:      ASSESSMENT AND PLAN:  ARRHYTHMIA:   I don't see evidence for atrial fib.  No change in therapy is planned.    AS:  This was mild and we will follow this clinically.   HTN -  The blood pressure is at target. No change in medications is indicated. We will continue with therapeutic lifestyle changes (TLC).  IDDM -     His A1c most recently was less than 5.  He apparently has had some low sugars.  Plan per Chipper Herb, MD  Obesity -   We discussed weight loss goals again today.  Carotid bruits  -     He had 1 - 39% bilateral stenosis in October.   No further testing is planned.   Current medicines are reviewed at length with the patient today.  The patient does not have concerns regarding medicines.  The following changes have been made:  None  Labs/ tests ordered today include:  None   Orders Placed This Encounter  Procedures  . EKG 12-Lead     Disposition:   FU with me in 12 months.      Signed, Minus Breeding, MD  01/11/2017 10:22 AM    [facility]

## 2017-01-11 ENCOUNTER — Ambulatory Visit (INDEPENDENT_AMBULATORY_CARE_PROVIDER_SITE_OTHER): Payer: BLUE CROSS/BLUE SHIELD | Admitting: Pharmacist

## 2017-01-11 ENCOUNTER — Ambulatory Visit (INDEPENDENT_AMBULATORY_CARE_PROVIDER_SITE_OTHER): Payer: BLUE CROSS/BLUE SHIELD | Admitting: Cardiology

## 2017-01-11 ENCOUNTER — Encounter: Payer: Self-pay | Admitting: Cardiology

## 2017-01-11 VITALS — BP 116/70 | HR 70 | Ht 70.0 in | Wt 259.0 lb

## 2017-01-11 VITALS — BP 100/68 | HR 66 | Ht 70.0 in | Wt 258.0 lb

## 2017-01-11 DIAGNOSIS — I479 Paroxysmal tachycardia, unspecified: Secondary | ICD-10-CM | POA: Insufficient documentation

## 2017-01-11 DIAGNOSIS — E109 Type 1 diabetes mellitus without complications: Secondary | ICD-10-CM | POA: Insufficient documentation

## 2017-01-11 DIAGNOSIS — E10649 Type 1 diabetes mellitus with hypoglycemia without coma: Secondary | ICD-10-CM | POA: Diagnosis not present

## 2017-01-11 DIAGNOSIS — I350 Nonrheumatic aortic (valve) stenosis: Secondary | ICD-10-CM | POA: Diagnosis not present

## 2017-01-11 DIAGNOSIS — I10 Essential (primary) hypertension: Secondary | ICD-10-CM | POA: Diagnosis not present

## 2017-01-11 NOTE — Patient Instructions (Signed)
email me blood glucose readings in 2 weeks.  [email or web address]  Change Lantus to 60 units twice a day

## 2017-01-11 NOTE — Patient Instructions (Signed)
Medication Instructions:  The current medical regimen is effective;  continue present plan and medications.  Follow-Up: Follow up in 1 year with Dr. Percival Spanish in [place].  You will receive a letter in the mail 2 months before you are due.  Please call us when you receive this letter to schedule your follow up appointment.  If you need a refill on your cardiac medications before your next appointment, please call your pharmacy.  Thank you for choosing [facility]!!

## 2017-01-11 NOTE — Progress Notes (Signed)
Patient ID: Tony Cantu, male   DOB: Sep 10, 1950, 66 y.o.   MRN: [id number]    Subjective:    Tony Cantu is a 66 y.o. male who presents for evaluation of Type 1 diabetes mellitus. Tony Cantu was referred by his PCP due to increased episodes of hypoglycemia. I last saw Tony Cantu about 1 month ago and insulin dose was adjusted.  The patient was initially diagnosed with Type 2 DM and started on oral medications at age 59 yo and quickly progressed over the first year to require insulin.   Tony Cantu was had a hypoglycemic episode which required EMS earlier in 2018.      Current symptoms/problems include hypoglycemia  and have been improving over the last month.  Per March HBG readings patient has about 60 BG readings less than 70.  Since we adjusted insulin 1 month ago in April patient had about 40 BG readings less than 70.  Over the alst month he has not had any BG reading over 200.  Tony Cantu likes to be very aggressive in keeping BG down.  He states that he was dissappointed when A1c increased from 4.7% (06/07/16) to 5.0% (09/2016)  Current insulin regimen is:  Lantus 50 units qam and 70 units qpm.  Humalog 10 to 30 units prior to meals and as needed for elevated BG.  Uses Humalog 3 now instead of at last visit he was injecting 3 to 6 times a day.   Checks BG 4 to 8 times a day.   For March 2018:  Am average = 85 (up from 82 in March) After breakfast = 101 (up from 93 in March) Before lunch = 84 (up from 26 in March After lunch = 97 (down from 108 in March) Before supper = 106 (up from 95 in March) After supper = 120 (up from 101 in March) Bedtime = 56 (up from 23 in March) - this was from #2 readings only  Known diabetic complications: cardiovascular disease Cardiovascular risk factors: advanced age (older than 39 for men, 27 for women), diabetes mellitus, dyslipidemia, hypertension, male gender and obesity (BMI >= 30 kg/m2)  Eye exam current (within one year): yes Weight trend: decreased  by 3# over the last month Prior visit with CDE:  Yes - first visit was 11/2016 Current diet: in general, a "healthy" diet   Current exercise: none but more active with yardwork over the last month Medication Compliance?  Yes    Objective:    BP 116/70   Pulse 70   Ht 5' 10"  (1.778 m)   Wt 259 lb (117.5 kg)   BMI 37.16 kg/m   Lab Review Glucose (mg/dL)  Date Value  10/25/2016 74  08/12/2016 187 (H)   Glucose, Bld (mg/dL)  Date Value  10/04/2016 58 (L)  08/08/2016 219 (H)  08/07/2016 154 (H)   CO2 (mmol/L)  Date Value  10/25/2016 26  10/04/2016 25  08/12/2016 22   BUN (mg/dL)  Date Value  10/25/2016 14  10/04/2016 8  08/12/2016 11  08/08/2016 13  08/07/2016 14  06/07/2016 14   Creat (mg/dL)  Date Value  01/07/2013 0.82   Creatinine, Ser (mg/dL)  Date Value  10/25/2016 0.92  10/04/2016 0.85  08/12/2016 0.84    Assessment:    Diabetes Mellitus type 1  - with recent history of hypoglycemic event requiring medical attention.  Low BG reading are improving. Plan:    1.  Rx changes: Decrease Lantus to 60 units bid (  discussed switch to [place] - pros and cons - patient has about 4 boxes of Lantus and wants to use more of it before considering switch)    Continue Novolog 10 to 30 units ith meal.   Continue to avoid correct for BG if less than 150 within 1 hour of a meal.   2.  Education: Reviewed 'ABCs' of diabetes management (respective goals in parentheses):  A1C (<7), blood pressure (<130/80), and cholesterol (LDL <100). 3. Again asked patient about considering newer options for HBG.  Discussed personal CGM - pt declines 4. CHO counting diet discussed.  Reviewed CHO amount in various foods and how to read nutrition labels.  Discussed recommended serving sizes.  5.  Recommend check BG 4 To 8  times a day 6.  Recommended increase physical activity - goal is 150 minutes per week 7. Follow up: 4 weeks  with PCP. Will follow up by email or phone in 1-2 weeks to  adjust insulin

## 2017-01-28 NOTE — Addendum Note (Signed)
Addendum  created 01/28/17 0926 by Duane Boston, MD   Sign clinical note

## 2017-01-31 DIAGNOSIS — K7460 Unspecified cirrhosis of liver: Secondary | ICD-10-CM | POA: Diagnosis not present

## 2017-03-13 ENCOUNTER — Other Ambulatory Visit: Payer: Self-pay | Admitting: *Deleted

## 2017-03-13 DIAGNOSIS — E559 Vitamin D deficiency, unspecified: Secondary | ICD-10-CM

## 2017-03-13 DIAGNOSIS — I10 Essential (primary) hypertension: Secondary | ICD-10-CM

## 2017-03-13 DIAGNOSIS — E349 Endocrine disorder, unspecified: Secondary | ICD-10-CM

## 2017-03-13 DIAGNOSIS — E785 Hyperlipidemia, unspecified: Secondary | ICD-10-CM

## 2017-03-13 DIAGNOSIS — E108 Type 1 diabetes mellitus with unspecified complications: Secondary | ICD-10-CM

## 2017-03-13 DIAGNOSIS — E1069 Type 1 diabetes mellitus with other specified complication: Secondary | ICD-10-CM

## 2017-03-14 ENCOUNTER — Other Ambulatory Visit: Payer: BLUE CROSS/BLUE SHIELD

## 2017-03-14 DIAGNOSIS — E1069 Type 1 diabetes mellitus with other specified complication: Secondary | ICD-10-CM | POA: Diagnosis not present

## 2017-03-14 DIAGNOSIS — E559 Vitamin D deficiency, unspecified: Secondary | ICD-10-CM | POA: Diagnosis not present

## 2017-03-14 DIAGNOSIS — I10 Essential (primary) hypertension: Secondary | ICD-10-CM

## 2017-03-14 DIAGNOSIS — E108 Type 1 diabetes mellitus with unspecified complications: Secondary | ICD-10-CM

## 2017-03-14 DIAGNOSIS — E349 Endocrine disorder, unspecified: Secondary | ICD-10-CM

## 2017-03-14 DIAGNOSIS — E785 Hyperlipidemia, unspecified: Secondary | ICD-10-CM | POA: Diagnosis not present

## 2017-03-14 LAB — BAYER DCA HB A1C WAIVED: HB A1C: 4.9 % (ref <7.0)

## 2017-03-15 LAB — NMR, LIPOPROFILE
CHOLESTEROL: 106 mg/dL (ref 100–199)
HDL CHOLESTEROL BY NMR: 49 mg/dL (ref >39)
HDL PARTICLE NUMBER: 26 umol/L — AB (ref >=30.5)
LDL Particle Number: 687 nmol/L (ref <1000)
LDL Size: 20.9 nm (ref >20.5)
LDL-C: 45 mg/dL (ref 0–99)
LP-IR Score: <25 (ref <=45)
Small LDL Particle Number: 219 nmol/L (ref <=527)
Triglycerides by NMR: 59 mg/dL (ref 0–149)

## 2017-03-17 LAB — BMP8+EGFR
BUN/Creatinine Ratio: 12 (ref 10–24)
BUN: 11 mg/dL (ref 8–27)
CALCIUM: 9.3 mg/dL (ref 8.6–10.2)
CHLORIDE: 101 mmol/L (ref 96–106)
CO2: 25 mmol/L (ref 20–29)
Creatinine, Ser: 0.95 mg/dL (ref 0.76–1.27)
GFR calc Af Amer: 95 mL/min/{1.73_m2} (ref >59)
GFR, EST NON AFRICAN AMERICAN: 82 mL/min/{1.73_m2} (ref >59)
Glucose: 99 mg/dL (ref 65–99)
POTASSIUM: 5.1 mmol/L (ref 3.5–5.2)
Sodium: 140 mmol/L (ref 134–144)

## 2017-03-17 LAB — PSA, TOTAL AND FREE
PSA, Free Pct: 20.4 %
PSA, Free: 0.49 ng/mL
Prostate Specific Ag, Serum: 2.4 ng/mL (ref 0.0–4.0)

## 2017-03-17 LAB — CBC WITH DIFFERENTIAL/PLATELET
Basophils Absolute: 0.1 10*3/uL (ref 0.0–0.2)
Basos: 1 %
EOS (ABSOLUTE): 0.2 10*3/uL (ref 0.0–0.4)
EOS: 3 %
HEMATOCRIT: 48.2 % (ref 37.5–51.0)
Hemoglobin: 15.2 g/dL (ref 13.0–17.7)
IMMATURE GRANS (ABS): 0 10*3/uL (ref 0.0–0.1)
IMMATURE GRANULOCYTES: 0 %
LYMPHS: 22 %
Lymphocytes Absolute: 1.5 10*3/uL (ref 0.7–3.1)
MCH: 30.7 pg (ref 26.6–33.0)
MCHC: 31.5 g/dL (ref 31.5–35.7)
MCV: 97 fL (ref 79–97)
MONOS ABS: 1.1 10*3/uL — AB (ref 0.1–0.9)
Monocytes: 16 %
Neutrophils Absolute: 4 10*3/uL (ref 1.4–7.0)
Neutrophils: 58 %
PLATELETS: 178 10*3/uL (ref 150–379)
RBC: 4.95 x10E6/uL (ref 4.14–5.80)
RDW: 15.5 % — AB (ref 12.3–15.4)
WBC: 6.9 10*3/uL (ref 3.4–10.8)

## 2017-03-17 LAB — HEPATIC FUNCTION PANEL
ALT: 35 IU/L (ref 0–44)
AST: 36 IU/L (ref 0–40)
Albumin: 4.1 g/dL (ref 3.6–4.8)
Alkaline Phosphatase: 63 IU/L (ref 39–117)
BILIRUBIN TOTAL: 0.7 mg/dL (ref 0.0–1.2)
Bilirubin, Direct: 0.24 mg/dL (ref 0.00–0.40)
Total Protein: 7.2 g/dL (ref 6.0–8.5)

## 2017-03-17 LAB — TESTOSTERONE,FREE AND TOTAL
TESTOSTERONE FREE: 11.5 pg/mL (ref 6.6–18.1)
TESTOSTERONE: 833 ng/dL (ref 264–916)

## 2017-03-17 LAB — VITAMIN D 25 HYDROXY (VIT D DEFICIENCY, FRACTURES): Vit D, 25-Hydroxy: 49.6 ng/mL (ref 30.0–100.0)

## 2017-03-28 ENCOUNTER — Ambulatory Visit (INDEPENDENT_AMBULATORY_CARE_PROVIDER_SITE_OTHER): Payer: BLUE CROSS/BLUE SHIELD | Admitting: Family Medicine

## 2017-03-28 ENCOUNTER — Encounter: Payer: Self-pay | Admitting: Family Medicine

## 2017-03-28 ENCOUNTER — Ambulatory Visit (INDEPENDENT_AMBULATORY_CARE_PROVIDER_SITE_OTHER): Payer: BLUE CROSS/BLUE SHIELD

## 2017-03-28 VITALS — BP 93/54 | HR 60 | Temp 97.5°F | Ht 70.0 in | Wt 260.0 lb

## 2017-03-28 DIAGNOSIS — Z0000 Encounter for general adult medical examination without abnormal findings: Secondary | ICD-10-CM | POA: Diagnosis not present

## 2017-03-28 DIAGNOSIS — E10649 Type 1 diabetes mellitus with hypoglycemia without coma: Secondary | ICD-10-CM

## 2017-03-28 DIAGNOSIS — E7800 Pure hypercholesterolemia, unspecified: Secondary | ICD-10-CM

## 2017-03-28 DIAGNOSIS — M25551 Pain in right hip: Secondary | ICD-10-CM | POA: Diagnosis not present

## 2017-03-28 DIAGNOSIS — N400 Enlarged prostate without lower urinary tract symptoms: Secondary | ICD-10-CM | POA: Diagnosis not present

## 2017-03-28 DIAGNOSIS — E1142 Type 2 diabetes mellitus with diabetic polyneuropathy: Secondary | ICD-10-CM

## 2017-03-28 DIAGNOSIS — D696 Thrombocytopenia, unspecified: Secondary | ICD-10-CM

## 2017-03-28 DIAGNOSIS — E559 Vitamin D deficiency, unspecified: Secondary | ICD-10-CM

## 2017-03-28 DIAGNOSIS — R8290 Unspecified abnormal findings in urine: Secondary | ICD-10-CM | POA: Diagnosis not present

## 2017-03-28 DIAGNOSIS — I10 Essential (primary) hypertension: Secondary | ICD-10-CM

## 2017-03-28 DIAGNOSIS — E349 Endocrine disorder, unspecified: Secondary | ICD-10-CM

## 2017-03-28 LAB — URINALYSIS, COMPLETE
Bilirubin, UA: NEGATIVE
Glucose, UA: NEGATIVE
Ketones, UA: NEGATIVE
NITRITE UA: NEGATIVE
PROTEIN UA: NEGATIVE
Specific Gravity, UA: 1.015 (ref 1.005–1.030)
UUROB: 0.2 mg/dL (ref 0.2–1.0)
pH, UA: 7.5 (ref 5.0–7.5)

## 2017-03-28 LAB — MICROSCOPIC EXAMINATION: Renal Epithel, UA: NONE SEEN /hpf

## 2017-03-28 NOTE — Patient Instructions (Addendum)
  Continue current medications. Continue good therapeutic lifestyle changes which include good diet and exercise. Fall precautions discussed with patient. If an FOBT was given today- please return it to our front desk. If you are over 67 years old - you may need Prevnar 86 or the adult Pneumonia vaccine.  **Flu shots are available--- please call and schedule a FLU-CLINIC appointment**  After your visit with [place] today you will receive a survey in the mail or online from [employer] regarding your care with [place]. Please take a moment to fill this out. Your feedback is very important to [place] as you can help [place] better understand your patient needs as well as improve your experience and satisfaction. WE CARE ABOUT YOU!!!   The patient should have his PSA repeated in a couple weeks because of elevation greater than previous reading. He should reduce his Diovan and take a half a pill on Monday Wednesday and Friday and a whole one on the other days and check readings at home because of low blood pressure readings He should check his blood sugars regularly for low readings He should make every effort to watch his sodium intake and lose weight as much as possible with diet and exercise Consider getting a screening chest CT in December because of history of cigarette smoking and patient having family history of lung cancer.

## 2017-03-28 NOTE — Progress Notes (Signed)
Subjective:    Patient ID: Tony Cantu, male    DOB: [birth year], 66 y.o.   MRN: [id number]  HPI Patient is here today for annual wellness exam and follow up of chronic medical problems which includes diabetes, hyperlipidemia and hypertension. He is taking medication regularly.The patient is complaining with discomfort in the right hip and he is concerned about a lesion on his scalp. The average blood pressure at home is running 100/60. Here today was 93/54. His wife is a Marine scientist. His cardiologist mention his blood pressure being low but did not change his medication any. He is currently on valsartan 160 mg daily. The patient has had lab work done and this is going to be reviewed with him during the visit today. The PSA 2 weeks ago was 2.45 months ago was 1.4. The rate of rise is greater than 0.75 in less than 5 months. The CBC has a normal white blood cell count. The hemoglobin is good at 15.2 and the platelet count is adequate. The blood sugar is good at 99. The creatinine, the most important kidney function test was within normal limits. Electrolytes including potassium are good. All liver function tests were normal. The vitamin D level was good at 49.6. The hemoglobin A1c remains well within the normal range at 4.9%. Testosterone levels were good and within normal limits. The patient does complain of some right hip discomfort. It is more of an aching sensation. It is an ongoing problem for a couple of years and he does not have any significant injury to date this from. He says when he is active it is hurting less. He's never had an x-ray. He is concerned about his scalp and the multiple seborrheic keratoses that are present and we reassured him about this but he does need to use protection from the sun. He denies any chest pain and does see the cardiologist regularly every May A1c year. He denies any shortness of breath anymore than usual. He's had some GI discomfort at times with some loose bowel  movements. He denies any blood in the stool or black tarry bowel movements. He has plans to see the gastroenterologist again in 6 months. He is currently not taking a probiotic. He's passing his water without problems.     Patient Active Problem List   Diagnosis Date Noted  . Paroxysmal tachycardia ([address]) 01/11/2017  . Nonrheumatic aortic valve stenosis 01/11/2017  . Type 1 diabetes mellitus without complication ([place]) 93/71/6967  . Abdominal aortic atherosclerosis ([place]) 11/23/2016  . HNP (herniated nucleus pulposus), lumbar 10/06/2016  . Acute renal injury ([place]) 08/06/2016  . Diarrhea 08/06/2016  . Fever 08/06/2016  . Hyperbilirubinemia 08/06/2016  . Septic shock ([place]) 08/06/2016  . Lactic acidosis 08/06/2016  . Type 2 diabetes mellitus with hyperlipidemia ([place]) 01/18/2016  . Inguinal hernia 12/10/2015  . Right groin pain 11/30/2015  . Thrombocytopenia ([place]) 07/21/2014  . Bilateral carotid bruits 05/11/2014  . Vitamin D deficiency 09/17/2013  . BPH (benign prostatic hyperplasia) 05/22/2013  . 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 Diabetes type 2, controlled ([place]) 01/10/2013  . Essential hypertension, benign 01/10/2013  . OSA (obstructive sleep apnea) 05/16/2012  . Edema 02/21/2012  . At risk for coronary artery disease 03/20/2011  . Obesity 03/20/2011  . WEIGHT GAIN, ABNORMAL 11/09/2010  . Hyperlipidemia 06/22/2010  . ALLERGIC RHINITIS 06/22/2010  . ASTHMA 06/22/2010  . COUGH 06/22/2010   Outpatient Encounter Prescriptions as of 03/28/2017  Medication Sig  . aspirin EC 81 MG tablet  Take 81 mg by mouth daily with breakfast.   . atorvastatin (LIPITOR) 40 MG tablet Take 1 tablet (40 mg total) by mouth daily.  Marland Kitchen b complex vitamins tablet Take 1 tablet by mouth daily at 12 noon.   . B-D ULTRA-FINE 33 LANCETS MISC Check Blood sugar 8 times daily and PRN.DX E11.9  . Calcium Carbonate (CALTRATE 600 PO) Take 1 tablet by mouth daily at 12 noon.   . Cholecalciferol (VITAMIN D3)  2000 UNITS capsule Take 2,000 Units by mouth daily at 12 noon.   . fexofenadine (ALLEGRA) 180 MG tablet Take 1 tablet (180 mg total) by mouth daily with breakfast.  . Flaxseed, Linseed, (FLAX SEED OIL PO) Take 1 capsule by mouth daily with breakfast.   . furosemide (LASIX) 20 MG tablet Take 1.5 tablets (30 mg total) by mouth daily.  Marland Kitchen glucagon (GLUCAGON EMERGENCY) 1 MG injection Inject 1 mg into the skin once as needed.  . Glucosamine-Chondroit-Vit C-Mn (GLUCOSAMINE 1500 COMPLEX) CAPS Take 2 capsules by mouth daily at 12 noon.   Marland Kitchen glucose blood (ONE TOUCH ULTRA TEST) test strip chesk BS q 2 hours PRN.dx.E11.9  . insulin glargine (LANTUS) 100 UNIT/ML injection Inject 0.6 mLs (60 Units total) into the skin 2 (two) times daily. As directed  . insulin lispro (HUMALOG) 100 UNIT/ML injection Inject 0.2-0.5 mLs (20-50 Units total) into the skin 3 (three) times daily before meals. Sliding scale  . Insulin Syringe-Needle U-100 (BD INSULIN SYRINGE ULTRAFINE) 31G X 5/16" 0.5 ML MISC USE TO INJECT 8 TO 10 TIMES DAILY, dx E11.9  . montelukast (SINGULAIR) 10 MG tablet Take 1 tablet (10 mg total) by mouth at bedtime.  . multivitamin-lutein (OCUVITE-LUTEIN) CAPS capsule Take 1 capsule by mouth daily at 12 noon.  . pantoprazole (PROTONIX) 40 MG tablet Take 1 tablet (40 mg total) by mouth daily.  . ranitidine (ZANTAC) 75 MG tablet Take 1 tablet (75 mg total) by mouth daily with breakfast.  . Testosterone 30 MG/ACT SOLN Use one (1) actuation under each axilla daily (= total of 40m daily)  . traMADol (ULTRAM) 50 MG tablet Take 1 tablet (50 mg total) by mouth every 6 (six) hours as needed.  . triamcinolone acetonide 40 MG/ML SUSP 40 mg, mupirocin cream 2 % CREA 15 application Apply 1 application topically 2 (two) times daily.  . valsartan (DIOVAN) 160 MG tablet Take 1 tablet (160 mg total) by mouth daily.   No facility-administered encounter medications on file as of 03/28/2017.      Review of Systems    Constitutional: Negative.   HENT: Negative.   Eyes: Negative.   Respiratory: Negative.   Cardiovascular: Negative.   Gastrointestinal: Negative.   Endocrine: Negative.   Genitourinary: Negative.   Musculoskeletal: Positive for arthralgias (right hip pain).  Skin: Negative.        Lesion on scalp  Allergic/Immunologic: Negative.   Neurological: Negative.   Hematological: Negative.   Psychiatric/Behavioral: Negative.        Objective:   Physical Exam  Constitutional: He is oriented to person, place, and time. He appears well-developed and well-nourished. No distress.  The patient is pleasant and alert  HENT:  Head: Normocephalic and atraumatic.  Right Ear: External ear normal.  Left Ear: External ear normal.  Nose: Nose normal.  Mouth/Throat: Oropharynx is clear and moist. No oropharyngeal exudate.  Eyes: Pupils are equal, round, and reactive to light. Conjunctivae and EOM are normal. Right eye exhibits no discharge. Left eye exhibits no discharge. No scleral icterus.  Neck: Normal range of motion. Neck supple. No thyromegaly present.  No bruits thyromegaly or anterior cervical adenopathy  Cardiovascular: Normal rate, regular rhythm, normal heart sounds and intact distal pulses.   No murmur heard. The heart is 72/m with a regular rate and rhythm  Pulmonary/Chest: Effort normal and breath sounds normal. No respiratory distress. He has no wheezes. He has no rales. He exhibits no tenderness.  Clear anteriorly and posteriorly with no axillary adenopathy  Abdominal: Soft. Bowel sounds are normal. He exhibits no mass. There is no tenderness. There is no rebound and no guarding.  Obesity Generalized abdominal tenderness without any liver or spleen enlargement or masses or inguinal adenopathy  Genitourinary: Rectum normal and penis normal.  Genitourinary Comments: The prostate is enlarged but soft and smooth. There were no rectal masses. The external genitalia were within normal limits  with no inguinal hernias being palpated. There was slight redness of the glans penis. Patient was instructed use some Monistat cream on this.  Musculoskeletal: He exhibits no edema or tenderness.  The patient has limited range of motion of both shoulders because of shoulder replacement bilaterally. No specific tenderness or point tenderness with the right hip.  Lymphadenopathy:    He has no cervical adenopathy.  Neurological: He is alert and oriented to person, place, and time. He has normal reflexes. No cranial nerve deficit.  Skin: Skin is warm and dry. No rash noted.  Psychiatric: He has a normal mood and affect. His behavior is normal. Judgment and thought content normal.  Nursing note and vitals reviewed.  BP (!) 93/54 (BP Location: Left Arm)   Pulse 60   Temp (!) 97.5 F (36.4 C) (Oral)   Ht 5' 10"  (1.778 m)   Wt 260 lb (117.9 kg)   BMI 37.31 kg/m         Assessment & Plan:  1. Annual physical exam -Patient is overweight and needs to lose weight and get blood pressures followed more regularly with reduced medication as directed today during the visit. - Urinalysis, Complete  2. Type 1 diabetes mellitus with hypoglycemia, with long-term current use of insulin (HCC) -Continue to monitor blood sugars and feet closely  3. Essential hypertension, benign -The blood pressure continues to run on the low side with readings from home. The patient will reduce his Diovan from 160 daily to one half on Monday Wednesday and Friday and a whole one on the other days.  4. Pure hypercholesterolemia -Cholesterol numbers were excellent and he will continue with current treatment.  5. Vitamin D deficiency -Continue with vitamin D replacement  6. Thrombocytopenia (HCC) -No bleeding issues or increased bruising issues or blood in the stool noted by patient.  7. Benign prostatic hyperplasia, unspecified whether lower urinary tract symptoms present -The prostate remains enlarged. The rate of  rise in the PSA was greater than 0.75 with the last PSA result and he will come in in a couple weeks for a repeat PSA and if it remains greater than 0.75 he will have to go see the urologist and he is understanding of this. - Urinalysis, Complete  8. Testosterone deficiency -Continue with testosterone replacement - Urinalysis, Complete  9. Right hip pain - DG HIP UNILAT W OR W/O PELVIS 2-3 VIEWS RIGHT; Future  10. Diabetic peripheral neuropathy associated with type 2 diabetes mellitus ([place]) -The patient had diminished sensation on the bottom of both the and on the bottom of the right great toe. He was reminded to check his feet regularly.  11. Morbid obesity ([place]) -The patient has 2 or more comorbidities risk factors with the BMI greater than 35 and this puts him in the morbid obese category. He understands to watch his diet closely lose weight through diet and exercise.  Patient Instructions   Continue current medications. Continue good therapeutic lifestyle changes which include good diet and exercise. Fall precautions discussed with patient. If an FOBT was given today- please return it to our front desk. If you are over 65 years old - you may need Prevnar 12 or the adult Pneumonia vaccine.  **Flu shots are available--- please call and schedule a FLU-CLINIC appointment**  After your visit with [place] today you will receive a survey in the mail or online from [employer] regarding your care with [place]. Please take a moment to fill this out. Your feedback is very important to [place] as you can help [place] better understand your patient needs as well as improve your experience and satisfaction. WE CARE ABOUT YOU!!!   The patient should have his PSA repeated in a couple weeks because of elevation greater than previous reading. He should reduce his Diovan and take a half a pill on Monday Wednesday and Friday and a whole one on the other days and check readings at home because of low blood pressure  readings He should check his blood sugars regularly for low readings He should make every effort to watch his sodium intake and lose weight as much as possible with diet and exercise   Arrie Senate MD

## 2017-03-28 NOTE — Addendum Note (Signed)
Addended by: Zannie Cove on: 03/28/2017 11:52 AM   Modules accepted: Orders

## 2017-03-30 LAB — URINE CULTURE

## 2017-04-10 ENCOUNTER — Encounter: Payer: Self-pay | Admitting: *Deleted

## 2017-04-11 ENCOUNTER — Other Ambulatory Visit: Payer: BLUE CROSS/BLUE SHIELD

## 2017-04-11 DIAGNOSIS — N400 Enlarged prostate without lower urinary tract symptoms: Secondary | ICD-10-CM | POA: Diagnosis not present

## 2017-04-11 DIAGNOSIS — R972 Elevated prostate specific antigen [PSA]: Secondary | ICD-10-CM

## 2017-04-11 LAB — MICROSCOPIC EXAMINATION
BACTERIA UA: NONE SEEN
Epithelial Cells (non renal): NONE SEEN /hpf (ref 0–10)
RBC, UA: NONE SEEN /hpf (ref 0–?)
Renal Epithel, UA: NONE SEEN /hpf
WBC, UA: NONE SEEN /hpf (ref 0–?)

## 2017-04-11 LAB — URINALYSIS, COMPLETE
Bilirubin, UA: NEGATIVE
Glucose, UA: NEGATIVE
Ketones, UA: NEGATIVE
Leukocytes, UA: NEGATIVE
Nitrite, UA: NEGATIVE
PROTEIN UA: NEGATIVE
RBC, UA: NEGATIVE
Specific Gravity, UA: 1.01 (ref 1.005–1.030)
Urobilinogen, Ur: 0.2 mg/dL (ref 0.2–1.0)
pH, UA: 7 (ref 5.0–7.5)

## 2017-04-12 LAB — PSA, TOTAL AND FREE
PSA FREE PCT: 25.3 %
PSA FREE: 0.38 ng/mL
Prostate Specific Ag, Serum: 1.5 ng/mL (ref 0.0–4.0)

## 2017-04-13 LAB — URINE CULTURE

## 2017-04-14 ENCOUNTER — Other Ambulatory Visit: Payer: Self-pay | Admitting: Family

## 2017-04-14 MED ORDER — CIPROFLOXACIN HCL 500 MG PO TABS: 500.0000 mg | ORAL_TABLET | Freq: Two times a day (BID) | ORAL | 0 refills | Status: DC

## 2017-05-04 ENCOUNTER — Other Ambulatory Visit: Payer: Self-pay | Admitting: Family Medicine

## 2017-05-16 ENCOUNTER — Ambulatory Visit: Payer: Medicare Other | Admitting: Nurse Practitioner

## 2017-06-07 DIAGNOSIS — G4733 Obstructive sleep apnea (adult) (pediatric): Secondary | ICD-10-CM | POA: Diagnosis not present

## 2017-06-12 ENCOUNTER — Ambulatory Visit (INDEPENDENT_AMBULATORY_CARE_PROVIDER_SITE_OTHER): Payer: BLUE CROSS/BLUE SHIELD | Admitting: *Deleted

## 2017-06-12 DIAGNOSIS — Z23 Encounter for immunization: Secondary | ICD-10-CM | POA: Diagnosis not present

## 2017-07-27 ENCOUNTER — Other Ambulatory Visit: Payer: Self-pay | Admitting: *Deleted

## 2017-07-27 ENCOUNTER — Ambulatory Visit: Payer: Medicare Other | Admitting: Podiatry

## 2017-07-27 DIAGNOSIS — E7800 Pure hypercholesterolemia, unspecified: Secondary | ICD-10-CM

## 2017-07-27 DIAGNOSIS — D696 Thrombocytopenia, unspecified: Secondary | ICD-10-CM

## 2017-07-27 DIAGNOSIS — N400 Enlarged prostate without lower urinary tract symptoms: Secondary | ICD-10-CM

## 2017-07-27 DIAGNOSIS — E559 Vitamin D deficiency, unspecified: Secondary | ICD-10-CM

## 2017-07-27 DIAGNOSIS — E349 Endocrine disorder, unspecified: Secondary | ICD-10-CM

## 2017-07-27 DIAGNOSIS — I10 Essential (primary) hypertension: Secondary | ICD-10-CM

## 2017-07-27 DIAGNOSIS — E10649 Type 1 diabetes mellitus with hypoglycemia without coma: Secondary | ICD-10-CM

## 2017-08-01 ENCOUNTER — Other Ambulatory Visit: Payer: BLUE CROSS/BLUE SHIELD

## 2017-08-01 DIAGNOSIS — E349 Endocrine disorder, unspecified: Secondary | ICD-10-CM

## 2017-08-01 DIAGNOSIS — E559 Vitamin D deficiency, unspecified: Secondary | ICD-10-CM | POA: Diagnosis not present

## 2017-08-01 DIAGNOSIS — E10649 Type 1 diabetes mellitus with hypoglycemia without coma: Secondary | ICD-10-CM | POA: Diagnosis not present

## 2017-08-01 DIAGNOSIS — D696 Thrombocytopenia, unspecified: Secondary | ICD-10-CM

## 2017-08-01 DIAGNOSIS — N400 Enlarged prostate without lower urinary tract symptoms: Secondary | ICD-10-CM

## 2017-08-01 DIAGNOSIS — E7800 Pure hypercholesterolemia, unspecified: Secondary | ICD-10-CM

## 2017-08-01 DIAGNOSIS — I10 Essential (primary) hypertension: Secondary | ICD-10-CM | POA: Diagnosis not present

## 2017-08-01 LAB — BAYER DCA HB A1C WAIVED: HB A1C (BAYER DCA - WAIVED): 5 % (ref <7.0)

## 2017-08-02 LAB — LIPID PANEL
CHOL/HDL RATIO: 2.2 ratio (ref 0.0–5.0)
Cholesterol, Total: 109 mg/dL (ref 100–199)
HDL: 49 mg/dL (ref >39)
LDL Calculated: 47 mg/dL (ref 0–99)
TRIGLYCERIDES: 63 mg/dL (ref 0–149)
VLDL Cholesterol Cal: 13 mg/dL (ref 5–40)

## 2017-08-02 LAB — CBC WITH DIFFERENTIAL/PLATELET
BASOS ABS: 0 10*3/uL (ref 0.0–0.2)
Basos: 1 %
EOS (ABSOLUTE): 0.2 10*3/uL (ref 0.0–0.4)
EOS: 4 %
HEMOGLOBIN: 15.7 g/dL (ref 13.0–17.7)
Hematocrit: 49.3 % (ref 37.5–51.0)
Immature Grans (Abs): 0 10*3/uL (ref 0.0–0.1)
Immature Granulocytes: 0 %
LYMPHS ABS: 1.8 10*3/uL (ref 0.7–3.1)
LYMPHS: 27 %
MCH: 30.8 pg (ref 26.6–33.0)
MCHC: 31.8 g/dL (ref 31.5–35.7)
MCV: 97 fL (ref 79–97)
MONOCYTES: 16 %
Monocytes Absolute: 1.1 10*3/uL — ABNORMAL HIGH (ref 0.1–0.9)
NEUTROS ABS: 3.4 10*3/uL (ref 1.4–7.0)
Neutrophils: 52 %
Platelets: 159 10*3/uL (ref 150–379)
RBC: 5.1 x10E6/uL (ref 4.14–5.80)
RDW: 16 % — AB (ref 12.3–15.4)
WBC: 6.6 10*3/uL (ref 3.4–10.8)

## 2017-08-02 LAB — BMP8+EGFR
BUN / CREAT RATIO: 12 (ref 10–24)
BUN: 12 mg/dL (ref 8–27)
CALCIUM: 9.9 mg/dL (ref 8.6–10.2)
CO2: 29 mmol/L (ref 20–29)
Chloride: 100 mmol/L (ref 96–106)
Creatinine, Ser: 0.97 mg/dL (ref 0.76–1.27)
GFR, EST AFRICAN AMERICAN: 93 mL/min/{1.73_m2} (ref >59)
GFR, EST NON AFRICAN AMERICAN: 80 mL/min/{1.73_m2} (ref >59)
Glucose: 107 mg/dL — ABNORMAL HIGH (ref 65–99)
Potassium: 4.8 mmol/L (ref 3.5–5.2)
Sodium: 141 mmol/L (ref 134–144)

## 2017-08-02 LAB — HEPATIC FUNCTION PANEL
ALT: 46 IU/L — AB (ref 0–44)
AST: 44 IU/L — AB (ref 0–40)
Albumin: 4.1 g/dL (ref 3.6–4.8)
Alkaline Phosphatase: 65 IU/L (ref 39–117)
BILIRUBIN TOTAL: 0.8 mg/dL (ref 0.0–1.2)
Bilirubin, Direct: 0.26 mg/dL (ref 0.00–0.40)
Total Protein: 7.3 g/dL (ref 6.0–8.5)

## 2017-08-02 LAB — VITAMIN D 25 HYDROXY (VIT D DEFICIENCY, FRACTURES): VIT D 25 HYDROXY: 63.8 ng/mL (ref 30.0–100.0)

## 2017-08-10 ENCOUNTER — Ambulatory Visit: Payer: BLUE CROSS/BLUE SHIELD | Admitting: Family Medicine

## 2017-08-10 ENCOUNTER — Encounter: Payer: Self-pay | Admitting: Family Medicine

## 2017-08-10 VITALS — BP 99/57 | HR 56 | Temp 97.0°F | Ht 70.0 in | Wt 260.0 lb

## 2017-08-10 DIAGNOSIS — B070 Plantar wart: Secondary | ICD-10-CM | POA: Diagnosis not present

## 2017-08-10 DIAGNOSIS — N400 Enlarged prostate without lower urinary tract symptoms: Secondary | ICD-10-CM

## 2017-08-10 DIAGNOSIS — E349 Endocrine disorder, unspecified: Secondary | ICD-10-CM

## 2017-08-10 DIAGNOSIS — E559 Vitamin D deficiency, unspecified: Secondary | ICD-10-CM | POA: Diagnosis not present

## 2017-08-10 DIAGNOSIS — I10 Essential (primary) hypertension: Secondary | ICD-10-CM

## 2017-08-10 DIAGNOSIS — E10649 Type 1 diabetes mellitus with hypoglycemia without coma: Secondary | ICD-10-CM

## 2017-08-10 DIAGNOSIS — D696 Thrombocytopenia, unspecified: Secondary | ICD-10-CM | POA: Diagnosis not present

## 2017-08-10 DIAGNOSIS — E7800 Pure hypercholesterolemia, unspecified: Secondary | ICD-10-CM

## 2017-08-10 MED ORDER — FREESTYLE LIBRE SENSOR SYSTEM MISC: 3 refills | Status: DC

## 2017-08-10 MED ORDER — FREESTYLE LIBRE READER DEVI: 1.0000 | 1 refills | Status: DC

## 2017-08-10 NOTE — Addendum Note (Signed)
Addended by: Zannie Cove on: 08/10/2017 08:59 AM   Modules accepted: Orders

## 2017-08-10 NOTE — Progress Notes (Signed)
Subjective:    Patient ID: Tony Cantu, male    DOB: [birth year], 66 y.o.   MRN: [id number]  HPI Pt here for follow up and management of chronic medical problems which includes hypertension, diabetes, and hyperlipidemia. He is taking medication regularly.  The patient is doing well overall.  He does complain of some congestion and a plantar wart on the left foot.  He is on testosterone replacement and we are checking his PSA and prostate about every 8 months.  He is due to return in FOBT.  His last colonoscopy was in June 2015.  He has had lab work done which will be reviewed with him during the visit today.  All of this his cholesterol numbers with traditional lipid testing were excellent with an LDL C being 47 and triglycerides being 63.  2 liver function tests were very slightly elevated and we will continue to monitor these all other liver function tests were normal.  The vitamin D level was excellent at 63.8 he will continue with his current vitamin D treatment the CBC was within normal limits with a normal white count and good hemoglobin and adequate platelet count.  The blood sugar was slightly elevated at 107 and the creatinine and electrolytes were normal.  A1c was excellent at 5.0.  He is always kept his blood sugar under very tight control.  The patient denies any chest pain or shortness of breath.  He does have some congestion and cough and he says this seems like what is going around with everybody out there right now.  He has not been running any fever and the sputum has no color.  He denies any trouble with his intestinal tract including nausea vomiting diarrhea blood in the stool or black tarry bowel movements.  There is no change in bowel habits.  He is passing his water without problems.  He is due for his eye exam in January and plans to keep up with these appointments.     Patient Active Problem List   Diagnosis Date Noted  . Paroxysmal tachycardia ([place]) 01/11/2017  . Nonrheumatic  aortic valve stenosis 01/11/2017  . Type 1 diabetes mellitus without complication ([place]) 01/75/1025  . Abdominal aortic atherosclerosis ([place]) 11/23/2016  . HNP (herniated nucleus pulposus), lumbar 10/06/2016  . Acute renal injury ([place]) 08/06/2016  . Diarrhea 08/06/2016  . Fever 08/06/2016  . Hyperbilirubinemia 08/06/2016  . Septic shock ([place]) 08/06/2016  . Lactic acidosis 08/06/2016  . Type 2 diabetes mellitus with hyperlipidemia ([facility] [place]) 01/18/2016  . Inguinal hernia 12/10/2015  . Right groin pain 11/30/2015  . Thrombocytopenia ([place]) 07/21/2014  . Bilateral carotid bruits 05/11/2014  . Vitamin D deficiency 09/17/2013  . BPH (benign prostatic hyperplasia) 05/22/2013  . 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 Diabetes type 2, controlled ([address]) 01/10/2013  . Essential hypertension, benign 01/10/2013  . OSA (obstructive sleep apnea) 05/16/2012  . Edema 02/21/2012  . At risk for coronary artery disease 03/20/2011  . Obesity 03/20/2011  . WEIGHT GAIN, ABNORMAL 11/09/2010  . Hyperlipidemia 06/22/2010  . ALLERGIC RHINITIS 06/22/2010  . ASTHMA 06/22/2010  . COUGH 06/22/2010   Outpatient Encounter Medications as of 08/10/2017  Medication Sig  . aspirin EC 81 MG tablet Take 81 mg by mouth daily with breakfast.   . atorvastatin (LIPITOR) 40 MG tablet Take 1 tablet (40 mg total) by mouth daily.  Marland Kitchen b complex vitamins tablet Take 1 tablet by mouth daily at 12 noon.   . Calcium Carbonate (CALTRATE 600  PO) Take 1 tablet by mouth daily at 12 noon.   . Cholecalciferol (VITAMIN D3) 2000 UNITS capsule Take 2,000 Units by mouth daily at 12 noon.   . fexofenadine (ALLEGRA) 180 MG tablet Take 1 tablet (180 mg total) by mouth daily with breakfast.  . Flaxseed, Linseed, (FLAX SEED OIL PO) Take 1 capsule by mouth daily with breakfast.   . furosemide (LASIX) 20 MG tablet Take 1.5 tablets (30 mg total) by mouth daily.  Marland Kitchen glucagon (GLUCAGON EMERGENCY) 1 MG injection Inject 1 mg into the skin once as needed.   . Glucosamine-Chondroit-Vit C-Mn (GLUCOSAMINE 1500 COMPLEX) CAPS Take 2 capsules by mouth daily at 12 noon.   . insulin glargine (LANTUS) 100 UNIT/ML injection Inject 0.6 mLs (60 Units total) into the skin 2 (two) times daily. As directed  . insulin lispro (HUMALOG) 100 UNIT/ML injection Inject 0.2-0.5 mLs (20-50 Units total) into the skin 3 (three) times daily before meals. Sliding scale  . Insulin Syringe-Needle U-100 (BD INSULIN SYRINGE ULTRAFINE) 31G X 5/16" 0.5 ML MISC USE TO INJECT 8 TO 10 TIMES DAILY, dx E11.9  . montelukast (SINGULAIR) 10 MG tablet Take 1 tablet (10 mg total) by mouth at bedtime.  . multivitamin-lutein (OCUVITE-LUTEIN) CAPS capsule Take 1 capsule by mouth daily at 12 noon.  . ONE TOUCH ULTRA TEST test strip CHECK BLOOD SUGAR EVERY 2  HOURS AS NEEDED  . ONETOUCH DELICA LANCETS 62G MISC CHECK BLOOD SUGAR 8 TIMES  DAILY  . pantoprazole (PROTONIX) 40 MG tablet Take 1 tablet (40 mg total) by mouth daily.  . ranitidine (ZANTAC) 75 MG tablet Take 1 tablet (75 mg total) by mouth daily with breakfast.  . Testosterone 30 MG/ACT SOLN Use one (1) actuation under each axilla daily (= total of 19m daily)  . traMADol (ULTRAM) 50 MG tablet Take 1 tablet (50 mg total) by mouth every 6 (six) hours as needed.  . triamcinolone acetonide 40 MG/ML SUSP 40 mg, mupirocin cream 2 % CREA 15 application Apply 1 application topically 2 (two) times daily.  . valsartan (DIOVAN) 160 MG tablet Take 1 tablet (160 mg total) by mouth daily.  . [DISCONTINUED] ciprofloxacin (CIPRO) 500 MG tablet Take 1 tablet (500 mg total) by mouth 2 (two) times daily.   No facility-administered encounter medications on file as of 08/10/2017.       Review of Systems  Constitutional: Negative.   HENT: Positive for congestion.   Eyes: Negative.   Respiratory: Negative.   Cardiovascular: Negative.   Gastrointestinal: Negative.   Endocrine: Negative.   Genitourinary: Negative.   Musculoskeletal: Negative.   Skin:  Negative.        Left foot - plantars wart  Allergic/Immunologic: Negative.   Neurological: Negative.   Hematological: Negative.   Psychiatric/Behavioral: Negative.        Objective:   Physical Exam  Constitutional: He is oriented to person, place, and time. He appears well-developed and well-nourished.  Patient is pleasant and alert and in good spirits.  HENT:  Head: Normocephalic and atraumatic.  Right Ear: External ear normal.  Left Ear: External ear normal.  Mouth/Throat: Oropharynx is clear and moist. No oropharyngeal exudate.  Nasal congestion bilaterally  Eyes: Conjunctivae and EOM are normal. Pupils are equal, round, and reactive to light. Right eye exhibits no discharge. Left eye exhibits no discharge. No scleral icterus.  Neck: Normal range of motion. Neck supple. No thyromegaly present.  Question bruits left greater than right.  No thyromegaly or anterior cervical adenopathy  Cardiovascular:  Normal rate, regular rhythm, normal heart sounds and intact distal pulses.  No murmur heard. Heart is regular at 60/min  Pulmonary/Chest: Effort normal and breath sounds normal. No respiratory distress. He has no wheezes. He has no rales. He exhibits no tenderness.  Minimal congestion with coughing and lungs are clear anteriorly and posteriorly  Abdominal: Soft. Bowel sounds are normal. He exhibits no mass. There is tenderness. There is no rebound and no guarding.  Abdominal obesity without masses or organ enlargement slight tenderness left upper quadrant.  Thickened skin from insulin injections.  Genitourinary: Prostate normal.  Musculoskeletal: Normal range of motion. He exhibits no edema.  Lymphadenopathy:    He has no cervical adenopathy.  Neurological: He is alert and oriented to person, place, and time. He displays abnormal reflex. No cranial nerve deficit.  Reflexes right lower extremity slightly decreased compared to left.  Decrease sensation to the fourth toe of the right  foot to fine motor testing  Skin: Skin is warm and dry. No rash noted. No erythema. No pallor.  Plantar wart on the plantar surface of the left foot.  Patient tolerated cryotherapy to the wart without problems.  Psychiatric: He has a normal mood and affect. His behavior is normal. Judgment and thought content normal.  Nursing note and vitals reviewed.   BP (!) 99/57 (BP Location: Left Arm)   Pulse (!) 56   Temp (!) 97 F (36.1 C) (Oral)   Ht 5' 10"  (1.778 m)   Wt 260 lb (117.9 kg)   BMI 37.31 kg/m   Cryotherapy to plantar wart of left foot.  Patient was instructed to follow this closely keep it clean with alcohol and watch for any infection.  He understands if he continues to have trouble with this that he will call us and we will make arrangements with a dermatologist for further treatment and evaluation    Assessment & Plan:  1. Type 1 diabetes mellitus with hypoglycemia, with long-term current use of insulin (HCC) -We will write prescription for freestyle libre monitor because of patient's history of diabetes and hypoglycemia.  2. Essential hypertension, benign -Pressure is good today and he will continue with current treatment  3. Pure hypercholesterolemia -All cholesterol numbers were excellent he will continue with current treatment  4. Vitamin D deficiency -Continue with current treatment  5. Thrombocytopenia (HCC) -Platelet count was good this time and we will continue to monitor this and he has had no bleeding problems.  6. Testosterone deficiency -Continue with testosterone replacement and recheck rectal exam and PSA at next visit  7. Benign prostatic hyperplasia, unspecified whether lower urinary tract symptoms present -No symptoms with voiding  8. Morbid obesity ([place]) -Patient understands the importance of weight loss through diet and exercise  9. Plantar wart of left foot -Trial therapy and patient tolerated procedure well  10.  Viral URI -Continue with  Mucinex and nasal saline and plenty of fluids and if any worsening situation give [place] a call back for antibiotic treatment  11.  Elevated liver function test -Repeat in 4 weeks  Patient Instructions                       Medicare Annual Wellness Visit   and the medical providers at [facility] strive to bring you the best medical care.  In doing so we not only want to address your current medical conditions and concerns but also to detect new conditions early and prevent illness,  disease and health-related problems.    Medicare offers a yearly Wellness Visit which allows our clinical staff to assess your need for preventative services including immunizations, lifestyle education, counseling to decrease risk of preventable diseases and screening for fall risk and other medical concerns.    This visit is provided free of charge (no copay) for all Medicare recipients. The clinical pharmacists at [facility] have begun to conduct these Wellness Visits which will also include a thorough review of all your medications.    As you primary medical provider recommend that you make an appointment for your Annual Wellness Visit if you have not done so already this year.  You may set up this appointment before you leave today or you may call back ([phone]) and schedule an appointment.  Please make sure when you call that you mention that you are scheduling your Annual Wellness Visit with the clinical pharmacist so that the appointment may be made for the proper length of time.     Continue current medications. Continue good therapeutic lifestyle changes which include good diet and exercise. Fall precautions discussed with patient. If an FOBT was given today- please return it to our front desk. If you are over 78 years old - you may need Prevnar 72 or the adult Pneumonia vaccine.  **Flu shots are available--- please call and schedule a FLU-CLINIC  appointment**  After your visit with [place] today you will receive a survey in the mail or online from [employer] regarding your care with [place]. Please take a moment to fill this out. Your feedback is very important to [place] as you can help [place] better understand your patient needs as well as improve your experience and satisfaction. WE CARE ABOUT YOU!!!   Take Mucinex maximum strength, blue and white in color, 1200 mg, 1 twice daily with a large glass of water Use nasal saline frequently to each nostril Drink plenty of fluids Keep the house as cool as possible If continued problems with plantar wart, make arrangements for patient to see dermatology. See cardiologist as planned Get eye exam as planned Repeat liver function test in 4 weeks  Arrie Senate MD

## 2017-08-10 NOTE — Patient Instructions (Addendum)
Medicare Annual Wellness Visit  [facility] and the medical providers at [facility] strive to bring you the best medical care.  In doing so we not only want to address your current medical conditions and concerns but also to detect new conditions early and prevent illness, disease and health-related problems.    Medicare offers a yearly Wellness Visit which allows our clinical staff to assess your need for preventative services including immunizations, lifestyle education, counseling to decrease risk of preventable diseases and screening for fall risk and other medical concerns.    This visit is provided free of charge (no copay) for all Medicare recipients. The clinical pharmacists at [facility] have begun to conduct these Wellness Visits which will also include a thorough review of all your medications.    As you primary medical provider recommend that you make an appointment for your Annual Wellness Visit if you have not done so already this year.  You may set up this appointment before you leave today or you may call back ([phone]) and schedule an appointment.  Please make sure when you call that you mention that you are scheduling your Annual Wellness Visit with the clinical pharmacist so that the appointment may be made for the proper length of time.     Continue current medications. Continue good therapeutic lifestyle changes which include good diet and exercise. Fall precautions discussed with patient. If an FOBT was given today- please return it to our front desk. If you are over 67 years old - you may need Prevnar 86 or the adult Pneumonia vaccine.  **Flu shots are available--- please call and schedule a FLU-CLINIC appointment**  After your visit with [place] today you will receive a survey in the mail or online from [employer] regarding your care with [place]. Please take a moment to fill this out. Your feedback is very  important to [place] as you can help [place] better understand your patient needs as well as improve your experience and satisfaction. WE CARE ABOUT YOU!!!   Take Mucinex maximum strength, blue and white in color, 1200 mg, 1 twice daily with a large glass of water Use nasal saline frequently to each nostril Drink plenty of fluids Keep the house as cool as possible If continued problems with plantar wart, make arrangements for patient to see dermatology. See cardiologist as planned Get eye exam as planned Repeat liver function test in 4 weeks

## 2017-08-18 ENCOUNTER — Other Ambulatory Visit: Payer: BLUE CROSS/BLUE SHIELD

## 2017-08-18 DIAGNOSIS — Z1212 Encounter for screening for malignant neoplasm of rectum: Secondary | ICD-10-CM

## 2017-08-18 NOTE — Addendum Note (Signed)
Addended by: Earlene Plater on: 08/18/2017 04:35 PM   Modules accepted: Orders

## 2017-08-25 DIAGNOSIS — Z794 Long term (current) use of insulin: Secondary | ICD-10-CM | POA: Diagnosis not present

## 2017-08-25 DIAGNOSIS — E108 Type 1 diabetes mellitus with unspecified complications: Secondary | ICD-10-CM | POA: Diagnosis not present

## 2017-08-26 LAB — FECAL OCCULT BLOOD, IMMUNOCHEMICAL: Fecal Occult Bld: NEGATIVE

## 2017-08-28 ENCOUNTER — Encounter: Payer: Self-pay | Admitting: Family Medicine

## 2017-08-29 ENCOUNTER — Other Ambulatory Visit: Payer: Self-pay | Admitting: Family Medicine

## 2017-08-29 ENCOUNTER — Other Ambulatory Visit: Payer: Self-pay | Admitting: Nurse Practitioner

## 2017-09-06 DIAGNOSIS — E108 Type 1 diabetes mellitus with unspecified complications: Secondary | ICD-10-CM | POA: Diagnosis not present

## 2017-09-06 DIAGNOSIS — Z794 Long term (current) use of insulin: Secondary | ICD-10-CM | POA: Diagnosis not present

## 2017-09-07 DIAGNOSIS — E109 Type 1 diabetes mellitus without complications: Secondary | ICD-10-CM | POA: Diagnosis not present

## 2017-09-07 LAB — HM DIABETES EYE EXAM

## 2017-09-08 DIAGNOSIS — G4733 Obstructive sleep apnea (adult) (pediatric): Secondary | ICD-10-CM | POA: Diagnosis not present

## 2017-09-09 ENCOUNTER — Other Ambulatory Visit: Payer: Self-pay | Admitting: Family Medicine

## 2017-09-12 ENCOUNTER — Other Ambulatory Visit: Payer: BLUE CROSS/BLUE SHIELD

## 2017-09-12 DIAGNOSIS — E7800 Pure hypercholesterolemia, unspecified: Secondary | ICD-10-CM | POA: Diagnosis not present

## 2017-09-13 LAB — HEPATIC FUNCTION PANEL
ALT: 45 IU/L — AB (ref 0–44)
AST: 47 IU/L — AB (ref 0–40)
Albumin: 4.4 g/dL (ref 3.6–4.8)
Alkaline Phosphatase: 72 IU/L (ref 39–117)
BILIRUBIN TOTAL: 0.6 mg/dL (ref 0.0–1.2)
Bilirubin, Direct: 0.17 mg/dL (ref 0.00–0.40)
Total Protein: 8.1 g/dL (ref 6.0–8.5)

## 2017-10-22 ENCOUNTER — Other Ambulatory Visit: Payer: Self-pay | Admitting: Family Medicine

## 2017-11-14 ENCOUNTER — Encounter: Payer: Self-pay | Admitting: Family Medicine

## 2017-11-17 ENCOUNTER — Encounter: Payer: BLUE CROSS/BLUE SHIELD | Admitting: Pharmacist Clinician (PhC)/ Clinical Pharmacy Specialist

## 2017-11-17 ENCOUNTER — Other Ambulatory Visit: Payer: Self-pay | Admitting: Pharmacist Clinician (PhC)/ Clinical Pharmacy Specialist

## 2017-11-17 MED ORDER — INSULIN GLARGINE 300 UNIT/ML ~~LOC~~ SOPN: 120.0000 [IU] | PEN_INJECTOR | Freq: Every day | SUBCUTANEOUS | 3 refills | Status: DC

## 2017-11-19 ENCOUNTER — Other Ambulatory Visit: Payer: Self-pay | Admitting: Family Medicine

## 2017-11-20 DIAGNOSIS — Z794 Long term (current) use of insulin: Secondary | ICD-10-CM | POA: Diagnosis not present

## 2017-11-20 DIAGNOSIS — E108 Type 1 diabetes mellitus with unspecified complications: Secondary | ICD-10-CM | POA: Diagnosis not present

## 2017-11-28 ENCOUNTER — Other Ambulatory Visit: Payer: Self-pay | Admitting: Family Medicine

## 2017-11-28 ENCOUNTER — Encounter: Payer: Self-pay | Admitting: Family Medicine

## 2017-11-28 MED ORDER — INSULIN GLARGINE 300 UNIT/ML ~~LOC~~ SOPN: 120.0000 [IU] | PEN_INJECTOR | Freq: Every day | SUBCUTANEOUS | 3 refills | Status: DC

## 2017-12-04 ENCOUNTER — Other Ambulatory Visit: Payer: Self-pay | Admitting: *Deleted

## 2017-12-04 ENCOUNTER — Encounter: Payer: Self-pay | Admitting: Family Medicine

## 2017-12-06 DIAGNOSIS — E108 Type 1 diabetes mellitus with unspecified complications: Secondary | ICD-10-CM | POA: Diagnosis not present

## 2017-12-06 DIAGNOSIS — Z794 Long term (current) use of insulin: Secondary | ICD-10-CM | POA: Diagnosis not present

## 2017-12-11 ENCOUNTER — Encounter: Payer: Self-pay | Admitting: Family Medicine

## 2017-12-11 ENCOUNTER — Other Ambulatory Visit: Payer: Self-pay | Admitting: *Deleted

## 2017-12-11 DIAGNOSIS — E10649 Type 1 diabetes mellitus with hypoglycemia without coma: Secondary | ICD-10-CM

## 2017-12-11 DIAGNOSIS — Z0000 Encounter for general adult medical examination without abnormal findings: Secondary | ICD-10-CM

## 2017-12-11 DIAGNOSIS — I10 Essential (primary) hypertension: Secondary | ICD-10-CM

## 2017-12-11 DIAGNOSIS — N400 Enlarged prostate without lower urinary tract symptoms: Secondary | ICD-10-CM

## 2017-12-11 DIAGNOSIS — E7800 Pure hypercholesterolemia, unspecified: Secondary | ICD-10-CM

## 2017-12-11 DIAGNOSIS — E349 Endocrine disorder, unspecified: Secondary | ICD-10-CM

## 2017-12-11 DIAGNOSIS — D696 Thrombocytopenia, unspecified: Secondary | ICD-10-CM

## 2017-12-11 DIAGNOSIS — E559 Vitamin D deficiency, unspecified: Secondary | ICD-10-CM

## 2017-12-18 ENCOUNTER — Other Ambulatory Visit: Payer: BLUE CROSS/BLUE SHIELD

## 2017-12-18 DIAGNOSIS — N400 Enlarged prostate without lower urinary tract symptoms: Secondary | ICD-10-CM

## 2017-12-18 DIAGNOSIS — E10649 Type 1 diabetes mellitus with hypoglycemia without coma: Secondary | ICD-10-CM | POA: Diagnosis not present

## 2017-12-18 DIAGNOSIS — I10 Essential (primary) hypertension: Secondary | ICD-10-CM

## 2017-12-18 DIAGNOSIS — Z0000 Encounter for general adult medical examination without abnormal findings: Secondary | ICD-10-CM

## 2017-12-18 DIAGNOSIS — E349 Endocrine disorder, unspecified: Secondary | ICD-10-CM

## 2017-12-18 DIAGNOSIS — D696 Thrombocytopenia, unspecified: Secondary | ICD-10-CM | POA: Diagnosis not present

## 2017-12-18 DIAGNOSIS — E7800 Pure hypercholesterolemia, unspecified: Secondary | ICD-10-CM

## 2017-12-18 DIAGNOSIS — E559 Vitamin D deficiency, unspecified: Secondary | ICD-10-CM

## 2017-12-18 LAB — BAYER DCA HB A1C WAIVED: HB A1C (BAYER DCA - WAIVED): 5.1 % (ref <7.0)

## 2017-12-21 ENCOUNTER — Other Ambulatory Visit: Payer: Self-pay | Admitting: Family Medicine

## 2017-12-21 LAB — BMP8+EGFR
BUN/Creatinine Ratio: 17 (ref 10–24)
BUN: 15 mg/dL (ref 8–27)
CALCIUM: 9.6 mg/dL (ref 8.6–10.2)
CHLORIDE: 103 mmol/L (ref 96–106)
CO2: 22 mmol/L (ref 20–29)
CREATININE: 0.9 mg/dL (ref 0.76–1.27)
GFR calc Af Amer: 101 mL/min/{1.73_m2} (ref >59)
GFR calc non Af Amer: 87 mL/min/{1.73_m2} (ref >59)
GLUCOSE: 55 mg/dL — AB (ref 65–99)
Potassium: 5.1 mmol/L (ref 3.5–5.2)
Sodium: 144 mmol/L (ref 134–144)

## 2017-12-21 LAB — LIPID PANEL
Chol/HDL Ratio: 2.1 ratio (ref 0.0–5.0)
Cholesterol, Total: 113 mg/dL (ref 100–199)
HDL: 53 mg/dL (ref >39)
LDL Calculated: 47 mg/dL (ref 0–99)
TRIGLYCERIDES: 63 mg/dL (ref 0–149)
VLDL Cholesterol Cal: 13 mg/dL (ref 5–40)

## 2017-12-21 LAB — CBC WITH DIFFERENTIAL/PLATELET
BASOS ABS: 0 10*3/uL (ref 0.0–0.2)
BASOS: 1 %
EOS (ABSOLUTE): 0.2 10*3/uL (ref 0.0–0.4)
Eos: 3 %
HEMOGLOBIN: 15.8 g/dL (ref 13.0–17.7)
Hematocrit: 48.2 % (ref 37.5–51.0)
IMMATURE GRANS (ABS): 0 10*3/uL (ref 0.0–0.1)
IMMATURE GRANULOCYTES: 0 %
LYMPHS: 25 %
Lymphocytes Absolute: 1.6 10*3/uL (ref 0.7–3.1)
MCH: 32.3 pg (ref 26.6–33.0)
MCHC: 32.8 g/dL (ref 31.5–35.7)
MCV: 99 fL — ABNORMAL HIGH (ref 79–97)
MONOCYTES: 19 %
Monocytes Absolute: 1.2 10*3/uL — ABNORMAL HIGH (ref 0.1–0.9)
NEUTROS PCT: 52 %
Neutrophils Absolute: 3.4 10*3/uL (ref 1.4–7.0)
PLATELETS: 165 10*3/uL (ref 150–379)
RBC: 4.89 x10E6/uL (ref 4.14–5.80)
RDW: 15.7 % — ABNORMAL HIGH (ref 12.3–15.4)
WBC: 6.4 10*3/uL (ref 3.4–10.8)

## 2017-12-21 LAB — HEPATIC FUNCTION PANEL
ALT: 40 IU/L (ref 0–44)
AST: 43 IU/L — AB (ref 0–40)
Albumin: 4.2 g/dL (ref 3.6–4.8)
Alkaline Phosphatase: 60 IU/L (ref 39–117)
Bilirubin Total: 0.6 mg/dL (ref 0.0–1.2)
Bilirubin, Direct: 0.24 mg/dL (ref 0.00–0.40)
Total Protein: 7.2 g/dL (ref 6.0–8.5)

## 2017-12-21 LAB — THYROID PANEL WITH TSH
FREE THYROXINE INDEX: 2.3 (ref 1.2–4.9)
T3 UPTAKE RATIO: 26 % (ref 24–39)
T4, Total: 8.7 ug/dL (ref 4.5–12.0)
TSH: 1.67 u[IU]/mL (ref 0.450–4.500)

## 2017-12-21 LAB — PSA, TOTAL AND FREE
PSA FREE PCT: 40 %
PSA, Free: 0.6 ng/mL
Prostate Specific Ag, Serum: 1.5 ng/mL (ref 0.0–4.0)

## 2017-12-21 LAB — TESTOSTERONE,FREE AND TOTAL
TESTOSTERONE FREE: 6.5 pg/mL — AB (ref 6.6–18.1)
TESTOSTERONE: 602 ng/dL (ref 264–916)

## 2017-12-21 LAB — VITAMIN D 25 HYDROXY (VIT D DEFICIENCY, FRACTURES): VIT D 25 HYDROXY: 57.8 ng/mL (ref 30.0–100.0)

## 2017-12-27 ENCOUNTER — Ambulatory Visit: Payer: BLUE CROSS/BLUE SHIELD | Admitting: Family Medicine

## 2017-12-27 ENCOUNTER — Encounter: Payer: Self-pay | Admitting: Family Medicine

## 2017-12-27 VITALS — BP 99/52 | HR 68 | Temp 97.6°F | Ht 70.0 in | Wt 259.0 lb

## 2017-12-27 DIAGNOSIS — N400 Enlarged prostate without lower urinary tract symptoms: Secondary | ICD-10-CM | POA: Diagnosis not present

## 2017-12-27 DIAGNOSIS — E0843 Diabetes mellitus due to underlying condition with diabetic autonomic (poly)neuropathy: Secondary | ICD-10-CM

## 2017-12-27 DIAGNOSIS — I10 Essential (primary) hypertension: Secondary | ICD-10-CM | POA: Diagnosis not present

## 2017-12-27 DIAGNOSIS — E7800 Pure hypercholesterolemia, unspecified: Secondary | ICD-10-CM | POA: Diagnosis not present

## 2017-12-27 DIAGNOSIS — Z23 Encounter for immunization: Secondary | ICD-10-CM

## 2017-12-27 DIAGNOSIS — E559 Vitamin D deficiency, unspecified: Secondary | ICD-10-CM | POA: Diagnosis not present

## 2017-12-27 DIAGNOSIS — D696 Thrombocytopenia, unspecified: Secondary | ICD-10-CM

## 2017-12-27 DIAGNOSIS — E10649 Type 1 diabetes mellitus with hypoglycemia without coma: Secondary | ICD-10-CM | POA: Diagnosis not present

## 2017-12-27 DIAGNOSIS — E349 Endocrine disorder, unspecified: Secondary | ICD-10-CM

## 2017-12-27 DIAGNOSIS — J9801 Acute bronchospasm: Secondary | ICD-10-CM

## 2017-12-27 LAB — URINALYSIS, COMPLETE
BILIRUBIN UA: NEGATIVE
GLUCOSE, UA: NEGATIVE
KETONES UA: NEGATIVE
LEUKOCYTES UA: NEGATIVE
Nitrite, UA: NEGATIVE
Protein, UA: NEGATIVE
RBC UA: NEGATIVE
SPEC GRAV UA: 1.015 (ref 1.005–1.030)
UUROB: 0.2 mg/dL (ref 0.2–1.0)
pH, UA: 7.5 (ref 5.0–7.5)

## 2017-12-27 LAB — MICROSCOPIC EXAMINATION
Bacteria, UA: NONE SEEN
RBC, UA: NONE SEEN /hpf (ref 0–2)
Renal Epithel, UA: NONE SEEN /hpf
WBC UA: NONE SEEN /HPF (ref 0–5)

## 2017-12-27 MED ORDER — ALBUTEROL SULFATE HFA 108 (90 BASE) MCG/ACT IN AERS: 2.0000 | INHALATION_SPRAY | Freq: Four times a day (QID) | RESPIRATORY_TRACT | 1 refills | Status: DC | PRN

## 2017-12-27 MED ORDER — SILDENAFIL CITRATE 50 MG PO TABS: 50.0000 mg | ORAL_TABLET | Freq: Every day | ORAL | 1 refills | Status: DC | PRN

## 2017-12-27 MED ORDER — SILDENAFIL CITRATE 20 MG PO TABS: ORAL_TABLET | ORAL | 1 refills | Status: DC

## 2017-12-27 NOTE — Patient Instructions (Addendum)
Medicare Annual Wellness Visit  [facility] and the medical providers at [facility] strive to bring you the best medical care.  In doing so we not only want to address your current medical conditions and concerns but also to detect new conditions early and prevent illness, disease and health-related problems.    Medicare offers a yearly Wellness Visit which allows our clinical staff to assess your need for preventative services including immunizations, lifestyle education, counseling to decrease risk of preventable diseases and screening for fall risk and other medical concerns.    This visit is provided free of charge (no copay) for all Medicare recipients. The clinical pharmacists at [facility] have begun to conduct these Wellness Visits which will also include a thorough review of all your medications.    As you primary medical provider recommend that you make an appointment for your Annual Wellness Visit if you have not done so already this year.  You may set up this appointment before you leave today or you may call back [phone]) and schedule an appointment.  Please make sure when you call that you mention that you are scheduling your Annual Wellness Visit with the clinical pharmacist so that the appointment may be made for the proper length of time.     Continue current medications. Continue good therapeutic lifestyle changes which include good diet and exercise. Fall precautions discussed with patient. If an FOBT was given today- please return it to our front desk. If you are over 47 years old - you may need Prevnar 57 or the adult Pneumonia vaccine.  **Flu shots are available--- please call and schedule a FLU-CLINIC appointment**  After your visit with [place] today you will receive a survey in the mail or online from [employer] regarding your care with [place]. Please take a moment to fill this out. Your feedback is very  important to [place] as you can help [place] better understand your patient needs as well as improve your experience and satisfaction. WE CARE ABOUT YOU!!!   If problems continue with the right knee we will make arrangements viewed to see the orthopedist that comes here in June Continue to work on diet aggressively losing as much weight as possible Continue with current dose of testosterone replacement We will call with the urinalysis result as soon as those results are returned We will also make an appointment for you to follow-up with the clinical pharmacist in the next 6 to 8 weeks Continue to check blood sugars regularly If abdominal pain continues after eating we will make an appointment for you to see the gastroenterologist and you will have to call us back about that.

## 2017-12-27 NOTE — Progress Notes (Signed)
Subjective:    Patient ID: Tony Cantu, male    DOB: Sep 21, 1950, 67 y.o.   MRN: [id number]  HPI Pt here for follow up and management of chronic medical problems which hypertension and diabetes. He is taking medication regularly.  Patient has recently had blood work and this will be reviewed with him during the visit today.  He is also requesting a refill on his sildenafil.  The PSA test was stable and within normal limits at 1.5.  The free testosterone level was slightly decreased but the total testosterone level was good at 602.  All thyroid tests were normal.  One liver function tests was slightly elevated but lower than it had been in the past.  All other liver function tests were normal.  The vitamin D level was excellent at 57.8.  All cholesterol numbers with traditional lipid testing were excellent and at goal.  The CBC had a normal white blood cell count a good hemoglobin and an adequate platelet count.  The blood sugar was low at 55.  The creatinine and all of the electrolytes were normal.  The hemoglobin A1c continues to run within normal limits at 5.1% and this is typical for this patient.  He continues to complain of right knee pain.  His vital signs were stable and his weight is 259 with a BMI of 37.31.  This patient has a long history of multiple medical problems.  He does have obstructive sleep apnea paroxysmal tachycardia hyperlipidemia hypertension controlled diabetes and bilateral carotid bruits.  He also has allergic rhinitis.  The patient continues to have right knee pain and he says it is at a level of about 3 out of 10.  It is bothersome to him often.  He is being seen by the orthopedic surgeon.  He also is concerned about his breathing issues and he does have a history of past cigarette smoking and has a tendency to sometimes to get short numbness of breath because of airway sensitivity and bronchospasm.  He is requesting a refill on his albuterol inhaler and we will give him that.  He  otherwise denies any chest pain or shortness of breath.  He does have some right upper quadrant pain periodically after eating and has had a CT scan in the past which showed a fatty liver he also has one liver function test that is elevated but no more than it has been in the past.  We told him that if this problem continued we would be happy to make a referral to the gastroenterologist for further evaluation.  He is up-to-date on his colonoscopies and is not due another one for 10 years.  He has not had any change in bowel habits or seen any blood in the stool or had any trouble with swallowing heartburn other than this right upper quadrant pain that occurs after eating.  He is concerned that his morning blood sugars have been a little bit higher around 130 but his hemoglobin A1c continues to run well within the normal limits at 5.1%.  He denies any trouble with passing his water burning pain or frequency.    Patient Active Problem List   Diagnosis Date Noted  . Paroxysmal tachycardia ([place]) 01/11/2017  . Nonrheumatic aortic valve stenosis 01/11/2017  . Type 1 diabetes mellitus without complication ([place]) 95/62/1308  . Abdominal aortic atherosclerosis ([place]) 11/23/2016  . HNP (herniated nucleus pulposus), lumbar 10/06/2016  . Acute renal injury ([place]) 08/06/2016  . Diarrhea 08/06/2016  . Fever  08/06/2016  . Hyperbilirubinemia 08/06/2016  . Septic shock ([place]) 08/06/2016  . Lactic acidosis 08/06/2016  . Type 2 diabetes mellitus with hyperlipidemia ([place]) 01/18/2016  . Inguinal hernia 12/10/2015  . Right groin pain 11/30/2015  . Thrombocytopenia ([place]) 07/21/2014  . Bilateral carotid bruits 05/11/2014  . Vitamin D deficiency 09/17/2013  . BPH (benign prostatic hyperplasia) 05/22/2013  . 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 Diabetes type 2, controlled ([place]) 01/10/2013  . Essential hypertension, benign 01/10/2013  . OSA (obstructive sleep apnea) 05/16/2012  . Edema 02/21/2012  . At risk for  coronary artery disease 03/20/2011  . Obesity 03/20/2011  . WEIGHT GAIN, ABNORMAL 11/09/2010  . Hyperlipidemia 06/22/2010  . ALLERGIC RHINITIS 06/22/2010  . ASTHMA 06/22/2010  . COUGH 06/22/2010   Outpatient Encounter Medications as of 12/27/2017  Medication Sig  . aspirin EC 81 MG tablet Take 81 mg by mouth daily with breakfast.   . atorvastatin (LIPITOR) 40 MG tablet TAKE 1 TABLET BY MOUTH  DAILY  . b complex vitamins tablet Take 1 tablet by mouth daily at 12 noon.   . Calcium Carbonate (CALTRATE 600 PO) Take 1 tablet by mouth daily at 12 noon.   . Cholecalciferol (VITAMIN D3) 2000 UNITS capsule Take 2,000 Units by mouth daily at 12 noon.   . Continuous Blood Gluc Receiver (FREESTYLE LIBRE READER) DEVI 1 applicator by Does not apply route as directed.  . Continuous Blood Gluc Sensor (FREESTYLE LIBRE SENSOR SYSTEM) MISC Check BS eight (8) times a day. Dx E10.9  . fexofenadine (ALLEGRA) 180 MG tablet Take 1 tablet (180 mg total) by mouth daily with breakfast.  . Flaxseed, Linseed, (FLAX SEED OIL PO) Take 1 capsule by mouth daily with breakfast.   . furosemide (LASIX) 20 MG tablet TAKE 1 AND 1/2 TABLETS BY  MOUTH DAILY  . GLUCAGON EMERGENCY 1 MG injection INJECT 1MG INTO SKIN ONCE  AS NEEDED  . Glucosamine-Chondroit-Vit C-Mn (GLUCOSAMINE 1500 COMPLEX) CAPS Take 2 capsules by mouth daily at 12 noon.   Marland Kitchen HUMALOG 100 UNIT/ML injection INJECT 20 TO 50 UNITS  SUBCUTANEOUSLY 3 TIMES  DAILY BEFORE MEALS  SLIDING SCALE  . Insulin Glargine (TOUJEO MAX SOLOSTAR) 300 UNIT/ML SOPN Inject 120 Units into the skin daily. Start with 96 units and adjust according to BG results.  Do no increase more than 2 units every 4 days  . Insulin Syringe-Needle U-100 (BD INSULIN SYRINGE ULTRAFINE) 31G X 5/16" 0.5 ML MISC USE TO INJECT 8 TO 10 TIMES DAILY, dx E11.9  . montelukast (SINGULAIR) 10 MG tablet TAKE 1 TABLET BY MOUTH AT  BEDTIME  . multivitamin-lutein (OCUVITE-LUTEIN) CAPS capsule Take 1 capsule by mouth daily at  12 noon.  Glory Rosebush DELICA LANCETS 41D MISC CHECK BLOOD SUGAR 8 TIMES  DAILY  . pantoprazole (PROTONIX) 40 MG tablet TAKE 1 TABLET BY MOUTH  DAILY  . ranitidine (ZANTAC) 75 MG tablet Take 1 tablet (75 mg total) by mouth daily with breakfast.  . Testosterone 30 MG/ACT SOLN USE 1 PUMP UNDER EACH  AXILLA DAILY (TOTAL OF 60MG DAILY)  . traMADol (ULTRAM) 50 MG tablet Take 1 tablet (50 mg total) by mouth every 6 (six) hours as needed.  . triamcinolone acetonide 40 MG/ML SUSP 40 mg, mupirocin cream 2 % CREA 15 application Apply 1 application topically 2 (two) times daily.  . valsartan (DIOVAN) 160 MG tablet TAKE 1 TABLET BY MOUTH  DAILY  . [DISCONTINUED] ONE TOUCH ULTRA TEST test strip CHECK BLOOD SUGAR EVERY 2  HOURS  AS NEEDED   No facility-administered encounter medications on file as of 12/27/2017.      Review of Systems  Constitutional: Negative.   HENT: Negative.   Eyes: Negative.   Respiratory: Negative.   Cardiovascular: Negative.   Gastrointestinal: Negative.   Endocrine: Negative.   Genitourinary: Negative.   Musculoskeletal: Positive for arthralgias (RIGHT knee pain).  Skin: Negative.   Allergic/Immunologic: Negative.   Neurological: Negative.   Hematological: Negative.   Psychiatric/Behavioral: Negative.        Objective:   Physical Exam  Constitutional: He is oriented to person, place, and time. He appears well-developed and well-nourished. No distress.  The patient is alert today.  HENT:  Head: Normocephalic and atraumatic.  Right Ear: External ear normal.  Left Ear: External ear normal.  Nose: Nose normal.  Mouth/Throat: Oropharynx is clear and moist. No oropharyngeal exudate.  Eyes: Pupils are equal, round, and reactive to light. Conjunctivae and EOM are normal. Right eye exhibits no discharge. Left eye exhibits no discharge. No scleral icterus.  Pupils are equal round and reactive to light.  Neck: Normal range of motion. Neck supple. No thyromegaly present.  No   thyromegaly or anterior cervical adenopathy.  Patient does have bilateral carotid bruits.  Cardiovascular: Normal rate, regular rhythm, normal heart sounds and intact distal pulses. Exam reveals no gallop and no friction rub.  No murmur heard. Heart is regular at 60/min  Pulmonary/Chest: Effort normal and breath sounds normal. No respiratory distress. He has no wheezes. He has no rales. He exhibits no tenderness.  Clear anteriorly and posteriorly without wheezes or rhonchi and no axillary adenopathy.  No chest Beal masses  Abdominal: Soft. Bowel sounds are normal. He exhibits no mass. There is no tenderness. There is no rebound and no guarding.  Abdominal obesity without masses tenderness or organ enlargement or bruits.  Genitourinary: Rectum normal and penis normal.  Genitourinary Comments: The prostate is enlarged but soft and smooth.  There were no rectal masses.  External genitalia were within normal limits and the left testicle was not palpated.  There were no inguinal hernias present.  Musculoskeletal: Normal range of motion. He exhibits tenderness and deformity. He exhibits no edema.  Limited range of motion of both shoulders because of history of rotator cuff surgery.  Tenderness right knee  Lymphadenopathy:    He has no cervical adenopathy.  Neurological: He is alert and oriented to person, place, and time. He has normal reflexes. No cranial nerve deficit.  Skin: Skin is warm and dry. No rash noted. No erythema. No pallor.  Psychiatric: He has a normal mood and affect. His behavior is normal. Judgment and thought content normal.  Nursing note and vitals reviewed.  BP (!) 99/52 (BP Location: Left Arm)   Pulse 68   Temp 97.6 F (36.4 C) (Oral)   Ht 5' 10"  (1.778 m)   Wt 259 lb (117.5 kg)   BMI 37.16 kg/m         Assessment & Plan:  1. Type 1 diabetes mellitus with hypoglycemia, with long-term current use of insulin ([place]) -Continue with current insulin therapy and current  treatment and arrange follow-up appointment with clinical pharmacist and diabetic educator in the next 6 to 8 weeks.  2. Essential hypertension -Blood pressure is good today and he will continue with current treatment  3. Pure hypercholesterolemia -Wynetta Emery all numbers were excellent and he will continue with current treatment  4. Vitamin D deficiency -Continue with vitamin D replacement  5. Thrombocytopenia ([place]) -  Platelet count was stable today  6. Testosterone deficiency -Testosterone levels were good with current treatment he will continue with current treatment  7. Benign prostatic hyperplasia, unspecified whether lower urinary tract symptoms present -Prostate was enlarged without lumps or masses - Urinalysis, Complete  8. Morbid obesity ([place]) -Patient was reminded the importance of losing weight through diet and exercise to get a better handle on his obesity.  9. Diabetes mellitus due to underlying condition with diabetic autonomic neuropathy, unspecified whether long term insulin use ([place]) -But not to the point of having hypoglycemia.  Continue with aggressive therapeutic lifestyle changes to keep sugar under good control but not to the point of having hypoglycemia.  10.  Bronchospasm -Prescription for albuterol called in as needed use.  Meds ordered this encounter  Medications  . sildenafil (REVATIO) 20 MG tablet    Sig: Take 2-5 tabs prn prior to sexual activity    Dispense:  50 tablet    Refill:  1  . sildenafil (VIAGRA) 50 MG tablet    Sig: Take 1 tablet (50 mg total) by mouth daily as needed for erectile dysfunction.    Dispense:  20 tablet    Refill:  1  . albuterol (PROVENTIL HFA;VENTOLIN HFA) 108 (90 Base) MCG/ACT inhaler    Sig: Inhale 2 puffs into the lungs every 6 (six) hours as needed for wheezing or shortness of breath.    Dispense:  3 Inhaler    Refill:  1   Patient Instructions                       Medicare Annual Wellness Visit  [facility] and  the medical providers at [facility] strive to bring you the best medical care.  In doing so we not only want to address your current medical conditions and concerns but also to detect new conditions early and prevent illness, disease and health-related problems.    Medicare offers a yearly Wellness Visit which allows our clinical staff to assess your need for preventative services including immunizations, lifestyle education, counseling to decrease risk of preventable diseases and screening for fall risk and other medical concerns.    This visit is provided free of charge (no copay) for all Medicare recipients. The clinical pharmacists at [facility] have begun to conduct these Wellness Visits which will also include a thorough review of all your medications.    As you primary medical provider recommend that you make an appointment for your Annual Wellness Visit if you have not done so already this year.  You may set up this appointment before you leave today or you may call back ([phone]) and schedule an appointment.  Please make sure when you call that you mention that you are scheduling your Annual Wellness Visit with the clinical pharmacist so that the appointment may be made for the proper length of time.     Continue current medications. Continue good therapeutic lifestyle changes which include good diet and exercise. Fall precautions discussed with patient. If an FOBT was given today- please return it to our front desk. If you are over 73 years old - you may need Prevnar 1 or the adult Pneumonia vaccine.  **Flu shots are available--- please call and schedule a FLU-CLINIC appointment**  After your visit with [place] today you will receive a survey in the mail or online from [employer] regarding your care with [place]. Please take a moment to fill this out. Your  feedback is very important to [place] as you can help [place] better understand your patient needs as  well as improve your experience and satisfaction. WE CARE ABOUT YOU!!!   If problems continue with the right knee we will make arrangements viewed to see the orthopedist that comes here in June Continue to work on diet aggressively losing as much weight as possible Continue with current dose of testosterone replacement We will call with the urinalysis result as soon as those results are returned We will also make an appointment for you to follow-up with the clinical pharmacist in the next 6 to 8 weeks Continue to check blood sugars regularly If abdominal pain continues after eating we will make an appointment for you to see the gastroenterologist and you will have to call us back about that.  Arrie Senate MD

## 2018-01-04 ENCOUNTER — Other Ambulatory Visit: Payer: Self-pay | Admitting: Family Medicine

## 2018-01-09 NOTE — Progress Notes (Signed)
`      Cardiology Office Note   Date:  01/10/2018   ID:  Tony Cantu, DOB 1951/05/01, MRN [id number]  PCP:  Chipper Herb, MD  Cardiologist:   Minus Breeding, MD  Referring:  Chipper Herb, MD  Chief Complaint  Patient presents with  . Atrial Fibrillation      History of Present Illness: Tony Cantu is a 67 y.o. male who presents for evaluation of arrhythmia.   He was previously seen by Dr. Aundra Dubin.  He had a cardiolite in 2009 that was a normal study. He uses CPAP for his sleep apnea. He has mild carotid disease.  The patient was admitted previously  with sepsis.  He had new onset atrial fib with RVR.  He was treated with Xarelto.  However, I stopped this as this appeared to be an isolated event.    Since I last saw him he has done well.  The patient denies any new symptoms such as chest discomfort, neck or arm discomfort. There has been no new shortness of breath, PND or orthopnea. There have been no reported palpitations, presyncope or syncope.  He does some golfing and yard work.  He gets no symptoms with this.    Past Medical History:  Diagnosis Date  . Arthritis   . Asthma   . BPH (benign prostatic hypertrophy)   . Colon polyps   . Depression    no meds  . Diabetes mellitus without complication ([place])   . Gastritis   . GERD (gastroesophageal reflux disease)   . Headache    HX  . Hypertension   . PONV (postoperative nausea and vomiting)   . Seizures ([place])    HYPOGLYCEMIC  . Sleep apnea    uses CPAP nightly  . Testicle trouble    one testicle    Past Surgical History:  Procedure Laterality Date  . BACK SURGERY     94  . CARPAL TUNNEL RELEASE Right 10/13/2015   Procedure: RIGHT CARPAL TUNNEL RELEASE;  Surgeon: Daryll Brod, MD;  Location: [facility];  Service: Orthopedics;  Laterality: Right;  . CARPAL TUNNEL RELEASE Left 07/05/2016   Procedure: LEFT CARPAL TUNNEL RELEASE;  Surgeon: Daryll Brod, MD;  Location: [facility];   Service: Orthopedics;  Laterality: Left;  . HERNIA REPAIR    . INGUINAL HERNIA REPAIR  2003   right   . KNEE ARTHROSCOPY Right 03/03/2016  . LUMBAR DISC SURGERY  8/96   Dr. Coralyn Mark, discectomy  . LUMBAR LAMINECTOMY/DECOMPRESSION MICRODISCECTOMY Right 10/06/2016   Procedure: RIGHT LUMBAR THREE - LUMBAR FOUR  LAMINECTOMY, FORAMINOTOMY AND MICRODISCECTOMY;  Surgeon: Jovita Gamma, MD;  Location: [facility];  Service: Neurosurgery;  Laterality: Right;  . SHOULDER SURGERY  11/28/05   left partial  . SHOULDER SURGERY  07/14/2006   RIGHT  . TONSILECTOMY, ADENOIDECTOMY, BILATERAL MYRINGOTOMY AND TUBES     NO TUBES      Current Outpatient Medications  Medication Sig Dispense Refill  . albuterol (PROVENTIL HFA;VENTOLIN HFA) 108 (90 Base) MCG/ACT inhaler Inhale 2 puffs into the lungs every 6 (six) hours as needed for wheezing or shortness of breath. 3 Inhaler 1  . aspirin EC 81 MG tablet Take 81 mg by mouth daily with breakfast.     . atorvastatin (LIPITOR) 40 MG tablet TAKE 1 TABLET BY MOUTH  DAILY 90 tablet 1  . b complex vitamins tablet Take 1 tablet by mouth daily at 12 noon.     Marland Kitchen  Calcium Carbonate (CALTRATE 600 PO) Take 1 tablet by mouth daily at 12 noon.     . Cholecalciferol (VITAMIN D3) 2000 UNITS capsule Take 2,000 Units by mouth daily at 12 noon.     . fexofenadine (ALLEGRA) 180 MG tablet Take 1 tablet (180 mg total) by mouth daily with breakfast. 90 tablet 3  . Flaxseed, Linseed, (FLAX SEED OIL PO) Take 1 capsule by mouth daily with breakfast.     . furosemide (LASIX) 20 MG tablet TAKE 1 AND 1/2 TABLETS BY  MOUTH DAILY 135 tablet 0  . GLUCAGON EMERGENCY 1 MG injection INJECT 1MG INTO SKIN ONCE  AS NEEDED 3 each 1  . Glucosamine-Chondroit-Vit C-Mn (GLUCOSAMINE 1500 COMPLEX) CAPS Take 2 capsules by mouth daily at 12 noon.     Marland Kitchen HUMALOG 100 UNIT/ML injection INJECT 20 TO 50 UNITS  SUBCUTANEOUSLY 3 TIMES  DAILY BEFORE MEALS PER SLIDING SCALE 140 mL 0  . Insulin Glargine (TOUJEO MAX SOLOSTAR)  300 UNIT/ML SOPN Inject 120 Units into the skin daily. Start with 96 units and adjust according to BG results.  Do no increase more than 2 units every 4 days 12 pen 3  . montelukast (SINGULAIR) 10 MG tablet TAKE 1 TABLET BY MOUTH AT  BEDTIME 90 tablet 0  . multivitamin-lutein (OCUVITE-LUTEIN) CAPS capsule Take 1 capsule by mouth daily at 12 noon.    . pantoprazole (PROTONIX) 40 MG tablet TAKE 1 TABLET BY MOUTH  DAILY 90 tablet 0  . ranitidine (ZANTAC) 75 MG tablet Take 1 tablet (75 mg total) by mouth daily with breakfast. 90 tablet 3  . sildenafil (REVATIO) 20 MG tablet Take 2-5 tabs prn prior to sexual activity 50 tablet 1  . Testosterone 30 MG/ACT SOLN USE 1 PUMP UNDER EACH  AXILLA DAILY (TOTAL OF 60MG DAILY) 270 mL 0  . traMADol (ULTRAM) 50 MG tablet Take 1 tablet (50 mg total) by mouth every 6 (six) hours as needed. 30 tablet 0  . triamcinolone acetonide 40 MG/ML SUSP 40 mg, mupirocin cream 2 % CREA 15 application Apply 1 application topically 2 (two) times daily. 30 g 1  . valsartan (DIOVAN) 160 MG tablet TAKE 1 TABLET BY MOUTH  DAILY 90 tablet 0  . Continuous Blood Gluc Receiver (FREESTYLE LIBRE READER) DEVI 1 applicator by Does not apply route as directed. 1 Device 1  . Continuous Blood Gluc Sensor (FREESTYLE LIBRE SENSOR SYSTEM) MISC Check BS eight (8) times a day. Dx E10.9 3 each 3  . Insulin Syringe-Needle U-100 (BD INSULIN SYRINGE ULTRAFINE) 31G X 5/16" 0.5 ML MISC USE TO INJECT 8 TO 10 TIMES DAILY, dx E11.9 700 each 3  . ONETOUCH DELICA LANCETS 99I MISC CHECK BLOOD SUGAR 8 TIMES  DAILY 800 each 2   No current facility-administered medications for this visit.     Allergies:   Erythromycin and Sulfonamide derivatives     ROS:  Please see the history of present illness.   Otherwise, review of systems are positive for back pain.   All other systems are reviewed and negative.    PHYSICAL EXAM: VS:  BP 120/72   Pulse (!) 58   Ht 5' 10"  (1.778 m)   Wt 260 lb (117.9 kg)   BMI 37.31  kg/m  , BMI Body mass index is 37.31 kg/m.  GENERAL:  Well appearing NECK:  No jugular venous distention, waveform within normal limits, carotid upstroke brisk and symmetric, no bruits, no thyromegaly LUNGS:  Clear to auscultation bilaterally BACK:  No  CVA tenderness CHEST:  Unremarkable HEART:  PMI not displaced or sustained,S1 and S2 within normal limits, no S3, no S4, no clicks, no rubs, 2/6 apical systolic murmur brief and mildly radiating, no diastolic murmurs ABD:  Flat, positive bowel sounds normal in frequency in pitch, no bruits, no rebound, no guarding, no midline pulsatile mass, no hepatomegaly, no splenomegaly EXT:  2 plus pulses throughout, trace edema, no cyanosis no clubbing  EKG:  EKG is  ordered today. The ekg ordered today demonstrates sinus rhythm, rate 67 , right bundle branch block, no acute ST-T wave changes.   Recent Labs: 12/18/2017: ALT 40; BUN 15; Creatinine, Ser 0.90; Hemoglobin 15.8; Platelets 165; Potassium 5.1; Sodium 144; TSH 1.670    Lipid Panel    Component Value Date/Time   CHOL 113 12/18/2017 1019   CHOL 107 01/07/2013 0840   TRIG 63 12/18/2017 1019   TRIG 59 03/14/2017 0802   TRIG 47 01/07/2013 0840   HDL 53 12/18/2017 1019   HDL 49 03/14/2017 0802   HDL 53 01/07/2013 0840   CHOLHDL 2.1 12/18/2017 1019   LDLCALC 47 12/18/2017 1019   LDLCALC 44 01/03/2014 0803   LDLCALC 45 01/07/2013 0840     Lab Results  Component Value Date   HGBA1C 5.0 10/04/2016    Wt Readings from Last 3 Encounters:  01/10/18 260 lb (117.9 kg)  12/27/17 259 lb (117.5 kg)  08/10/17 260 lb (117.9 kg)      Other studies Reviewed: Additional studies/ records that were reviewed today include: None Review of the above records demonstrates:      ASSESSMENT AND PLAN:  ARRHYTHMIA:   Never seen atrial fibrillation.  His wife is a Marine scientist and says she is look for this and she is never seen atrial fibrillation.  I could find no documented evidence so he did stop the  blood thinner and no change in therapy.  AS:  This was mild in the past.   This still appears to be mild clinically.  No further imaging this year.  HTN -  The blood pressure is at target.  No change in therapy.   IDDM -    His A1c most recently was less 5.1.  This is followed byMoore, Estella Husk, MD  Obesity -   We had another discussion about this.   I did suggest certain strategies.    Carotid bruits  -     He had 1 - 39% bilateral stenosis in October 2017.  No further testing is planned.   RBBB:  This is chronic.  No further imagine.    Current medicines are reviewed at length with the patient today.  The patient does not have concerns regarding medicines.  The following changes have been made:  None  Labs/ tests ordered today include:  None  Orders Placed This Encounter  Procedures  . EKG 12-Lead     Disposition:   FU with me in 12  months.      Signed, Minus Breeding, MD  01/10/2018 2:02 PM    [facility]

## 2018-01-10 ENCOUNTER — Ambulatory Visit: Payer: BLUE CROSS/BLUE SHIELD | Admitting: Cardiology

## 2018-01-10 ENCOUNTER — Encounter: Payer: Self-pay | Admitting: Cardiology

## 2018-01-10 VITALS — BP 120/72 | HR 58 | Ht 70.0 in | Wt 260.0 lb

## 2018-01-10 DIAGNOSIS — I10 Essential (primary) hypertension: Secondary | ICD-10-CM

## 2018-01-10 DIAGNOSIS — I350 Nonrheumatic aortic (valve) stenosis: Secondary | ICD-10-CM

## 2018-01-10 DIAGNOSIS — I4510 Unspecified right bundle-branch block: Secondary | ICD-10-CM | POA: Diagnosis not present

## 2018-01-10 NOTE — Patient Instructions (Signed)
Medication Instructions:  The current medical regimen is effective;  continue present plan and medications.  Follow-Up: Follow up in 1 year with Dr. Percival Spanish.  You will receive a letter in the mail 2 months before you are due.  Please call us when you receive this letter to schedule your follow up appointment.  If you need a refill on your cardiac medications before your next appointment, please call your pharmacy.  Thank you for choosing [facility]!!

## 2018-01-12 ENCOUNTER — Encounter: Payer: Self-pay | Admitting: Family Medicine

## 2018-01-13 ENCOUNTER — Ambulatory Visit: Payer: BLUE CROSS/BLUE SHIELD | Admitting: Nurse Practitioner

## 2018-01-13 ENCOUNTER — Encounter: Payer: Self-pay | Admitting: Nurse Practitioner

## 2018-01-13 VITALS — BP 106/62 | HR 79 | Temp 97.4°F | Ht 70.0 in | Wt 261.0 lb

## 2018-01-13 DIAGNOSIS — N3000 Acute cystitis without hematuria: Secondary | ICD-10-CM

## 2018-01-13 DIAGNOSIS — R109 Unspecified abdominal pain: Secondary | ICD-10-CM | POA: Diagnosis not present

## 2018-01-13 MED ORDER — CIPROFLOXACIN HCL 500 MG PO TABS: 500.0000 mg | ORAL_TABLET | Freq: Two times a day (BID) | ORAL | 0 refills | Status: DC

## 2018-01-13 NOTE — Progress Notes (Signed)
   Subjective:    Patient ID: Tony Cantu, male    DOB: 1950-11-09, 67 y.o.   MRN: [id number]   Chief Complaint: Urinary Tract Infection (flank pain)   HPI Patient comes in c/o left lower back and flank pain. He had a kidney infection last year that felt the same way. Denies dysuria but does have frequency and urgency but he os on fluid pill. Rates pain 2-5/10. nothig changes its intensity. Nothing makes it better.   Review of Systems  Constitutional: Negative.   HENT: Negative.   Respiratory: Negative.   Cardiovascular: Negative.   Gastrointestinal: Negative.   Genitourinary: Negative.   Musculoskeletal: Positive for back pain (left lower back).  Psychiatric/Behavioral: Negative.   All other systems reviewed and are negative.      Objective:   Physical Exam  Constitutional: He is oriented to person, place, and time. He appears well-developed and well-nourished. He appears distressed (mild).  Cardiovascular: Normal rate.  Pulmonary/Chest: Effort normal.  Abdominal: Soft. There is no tenderness. There is no guarding.  Genitourinary:  Genitourinary Comments: (+) CVA tenderness on left From of lumbar spine without pain Able to raise from sitting to standing without problem  Neurological: He is alert and oriented to person, place, and time.  Skin: Skin is warm.   BP 106/62   Pulse 79   Temp (!) 97.4 F (36.3 C) (Oral)   Ht 5' 10"  (1.778 m)   Wt 261 lb (118.4 kg)   BMI 37.45 kg/m   Urine dip inconclusive     Assessment & Plan:  Tony Cantu in today with chief complaint of Urinary Tract Infection (flank pain)   1. Flank pain  Urinalysis - Urine Culture  2. Acute cystitis without hematuria Take medication as prescribe Cotton underwear Take shower not bath Cranberry juice, yogurt Force fluids AZO over the counter X2 days Culture pending RTO prn  - ciprofloxacin (CIPRO) 500 MG tablet; Take 1 tablet (500 mg total) by mouth 2 (two) times daily.  Dispense:  14 tablet; Refill: 0  Mary-Margaret Hassell Done, FNP

## 2018-01-14 LAB — URINE CULTURE

## 2018-01-15 LAB — URINALYSIS
BILIRUBIN UA: NEGATIVE
GLUCOSE, UA: NEGATIVE
Ketones, UA: NEGATIVE
Nitrite, UA: NEGATIVE
PH UA: 7 (ref 5.0–7.5)
Protein, UA: NEGATIVE
RBC UA: NEGATIVE
SPEC GRAV UA: 1.01 (ref 1.005–1.030)
Urobilinogen, Ur: 0.2 mg/dL (ref 0.2–1.0)

## 2018-01-17 ENCOUNTER — Telehealth: Payer: Self-pay

## 2018-01-17 NOTE — Telephone Encounter (Signed)
Needs to be seen by PCP

## 2018-01-17 NOTE — Telephone Encounter (Signed)
Patient states that he is only slightly better- still having lower back pain and flank pain. Please advise

## 2018-01-18 NOTE — Telephone Encounter (Signed)
Spoke with patient- he states he is feeling a little better than yesterday.  Advised him that MMM recommended that he follow up with Dr. Laurance Flatten.  Patient wants to continue taking Cipro and see if symptoms continue to improve before scheduling with Dr. Laurance Flatten.  Advised patient to call back if symptoms worsen or he develops a fever.

## 2018-01-23 ENCOUNTER — Encounter: Payer: Self-pay | Admitting: Pulmonary Disease

## 2018-01-23 ENCOUNTER — Ambulatory Visit: Payer: BLUE CROSS/BLUE SHIELD | Admitting: Pulmonary Disease

## 2018-01-23 VITALS — BP 112/70 | HR 61 | Ht 70.0 in | Wt 257.0 lb

## 2018-01-23 DIAGNOSIS — G4733 Obstructive sleep apnea (adult) (pediatric): Secondary | ICD-10-CM | POA: Diagnosis not present

## 2018-01-23 NOTE — Patient Instructions (Signed)
Follow up in 1 year.

## 2018-01-23 NOTE — Progress Notes (Signed)
[facility]  Chief Complaint  Patient presents with  . Follow-up    pt doing well overall with cpap machine    Vital signs: BP 112/70 (BP Location: Left Arm, Cuff Size: Normal)   Pulse 61   Ht 5' 10"  (1.778 m)   Wt 257 lb (116.6 kg)   SpO2 97%   BMI 36.88 kg/m   History of Present Illness: NEVAAN BUNTON is a 67 y.o. male with obstructive sleep apnea.  He uses CPAP nightly.  Feels pressure is okay.  Had trouble with previous mask >> caused TMJ.  Current mask is better.  Still gets occasionally mask leak.  Sleeps well otherwise, and feels rested.  Physical Exam:  General - pleasant Eyes - pupils reactive ENT - no sinus tenderness, no oral exudate, no LAN Cardiac - regular, no murmur Chest - no wheeze, rales Abd - soft, non tender Ext - no edema Skin - no rashes Neuro - normal strength Psych - normal mood  Assessment/Plan:  Obstructive sleep apnea. - he is compliant with CPAP and reports benefit from therapy - explained he might be eligible for a new machine, but he would like to continue with his current machine - continue auto CPAP  Hx of TMJ. - wears mouth guard intermittently   Patient Instructions  Follow up in 1 year    Chesley Mires, MD [place] 01/23/2018, 9:17 AM  Flow Sheet  Pulmonary tests: PFT 02/09/16 >> FEV1 2.38 (70%), FEV1% 84, TLC 4.96 (70%), DLCO 78%  Sleep tests PSG 05/02/12 >> AHI 76 Auto CPAP 10/25/17 to 01/22/18 >> used on 90 of 90 nights with average 7 hrs 18 min.  Average AHI 1 with median CPAP 19 and 95 th percentile CPAP 20 cm H2O  Cardiac tests Echo 08/09/16 >> EF 60 to 65%  Past Medical History: He  has a past medical history of Arthritis, Asthma, BPH (benign prostatic hypertrophy), Colon polyps, Depression, Diabetes mellitus without complication ([place]), Gastritis, GERD (gastroesophageal reflux disease), Headache, Hypertension, PONV (postoperative nausea and  vomiting), Seizures ([place]), Sleep apnea, and Testicle trouble.  Past Surgical History: He  has a past surgical history that includes Tonsilectomy, adenoidectomy, bilateral myringotomy and tubes; Lumbar disc surgery (8/96); Inguinal hernia repair (2003); Shoulder surgery (11/28/05); Shoulder surgery (07/14/2006); Carpal tunnel release (Right, 10/13/2015); Knee arthroscopy (Right, 03/03/2016); Carpal tunnel release (Left, 07/05/2016); Hernia repair; Back surgery; and Lumbar laminectomy/decompression microdiscectomy (Right, 10/06/2016).  Family History: His family history includes Allergic rhinitis in his sister; Alzheimer's disease in his mother; Asthma in his father; Breast cancer in his maternal grandmother; Diabetes in his mother; Heart attack in his maternal grandmother and mother; Heart failure in his maternal grandmother and mother; Rheum arthritis in his maternal grandmother; Suicidality in his father.  Social History: He  reports that he quit smoking about 16 years ago. His smoking use included cigarettes and pipe. He quit after 34.00 years of use. He has never used smokeless tobacco. He reports that he does not drink alcohol or use drugs.  Medications: Allergies as of 01/23/2018      Reactions   Erythromycin Diarrhea   Sulfonamide Derivatives Diarrhea      Medication List        Accurate as of 01/23/18  9:17 AM. Always use your most recent med list.          albuterol 108 (90 Base) MCG/ACT inhaler Commonly known as:  PROVENTIL HFA;VENTOLIN HFA Inhale 2 puffs into the lungs  every 6 (six) hours as needed for wheezing or shortness of breath.   aspirin EC 81 MG tablet Take 81 mg by mouth daily with breakfast.   atorvastatin 40 MG tablet Commonly known as:  LIPITOR TAKE 1 TABLET BY MOUTH  DAILY   b complex vitamins tablet Take 1 tablet by mouth daily at 12 noon.   CALTRATE 600 PO Take 1 tablet by mouth daily at 12 noon.   ciprofloxacin 500 MG tablet Commonly known as:   CIPRO Take 1 tablet (500 mg total) by mouth 2 (two) times daily.   fexofenadine 180 MG tablet Commonly known as:  ALLEGRA Take 1 tablet (180 mg total) by mouth daily with breakfast.   FLAX SEED OIL PO Take 1 capsule by mouth daily with breakfast.   FREESTYLE LIBRE READER Devi 1 applicator by Does not apply route as directed.   FREESTYLE LIBRE SENSOR SYSTEM Misc Check BS eight (8) times a day. Dx E10.9   furosemide 20 MG tablet Commonly known as:  LASIX TAKE 1 AND 1/2 TABLETS BY  MOUTH DAILY   GLUCAGON EMERGENCY 1 MG injection Generic drug:  glucagon INJECT 1MG INTO SKIN ONCE  AS NEEDED   GLUCOSAMINE 1500 COMPLEX Caps Take 2 capsules by mouth daily at 12 noon.   HUMALOG 100 UNIT/ML injection Generic drug:  insulin lispro INJECT 20 TO 50 UNITS  SUBCUTANEOUSLY 3 TIMES  DAILY BEFORE MEALS PER SLIDING SCALE   Insulin Glargine 300 UNIT/ML Sopn Commonly known as:  TOUJEO MAX SOLOSTAR Inject 120 Units into the skin daily. Start with 96 units and adjust according to BG results.  Do no increase more than 2 units every 4 days   Insulin Syringe-Needle U-100 31G X 5/16" 0.5 ML Misc Commonly known as:  BD INSULIN SYRINGE ULTRAFINE USE TO INJECT 8 TO 10 TIMES DAILY, dx E11.9   montelukast 10 MG tablet Commonly known as:  SINGULAIR TAKE 1 TABLET BY MOUTH AT  BEDTIME   multivitamin-lutein Caps capsule Take 1 capsule by mouth daily at 12 noon.   ONETOUCH DELICA LANCETS 91P Misc CHECK BLOOD SUGAR 8 TIMES  DAILY   pantoprazole 40 MG tablet Commonly known as:  PROTONIX TAKE 1 TABLET BY MOUTH  DAILY   ranitidine 75 MG tablet Commonly known as:  ZANTAC Take 1 tablet (75 mg total) by mouth daily with breakfast.   sildenafil 20 MG tablet Commonly known as:  REVATIO Take 2-5 tabs prn prior to sexual activity   Testosterone 30 MG/ACT Soln USE 1 PUMP UNDER EACH  AXILLA DAILY (TOTAL OF 60MG DAILY)   traMADol 50 MG tablet Commonly known as:  ULTRAM Take 1 tablet (50 mg total) by  mouth every 6 (six) hours as needed.   triamcinolone acetonide 40 MG/ML SUSP 40 mg, mupirocin cream 2 % CREA 15 application Apply 1 application topically 2 (two) times daily.   valsartan 160 MG tablet Commonly known as:  DIOVAN TAKE 1 TABLET BY MOUTH  DAILY   Vitamin D3 2000 units capsule Take 2,000 Units by mouth daily at 12 noon.

## 2018-01-24 ENCOUNTER — Encounter: Payer: Self-pay | Admitting: Pulmonary Disease

## 2018-01-24 NOTE — Telephone Encounter (Signed)
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  Please advise if any recommendations, thank you.

## 2018-02-03 ENCOUNTER — Other Ambulatory Visit: Payer: Self-pay | Admitting: Family Medicine

## 2018-02-08 DIAGNOSIS — E108 Type 1 diabetes mellitus with unspecified complications: Secondary | ICD-10-CM | POA: Diagnosis not present

## 2018-02-09 ENCOUNTER — Ambulatory Visit: Payer: BLUE CROSS/BLUE SHIELD | Admitting: Pharmacist Clinician (PhC)/ Clinical Pharmacy Specialist

## 2018-02-13 ENCOUNTER — Encounter: Payer: Self-pay | Admitting: Pulmonary Disease

## 2018-02-13 DIAGNOSIS — G4733 Obstructive sleep apnea (adult) (pediatric): Secondary | ICD-10-CM

## 2018-02-20 DIAGNOSIS — Z794 Long term (current) use of insulin: Secondary | ICD-10-CM | POA: Diagnosis not present

## 2018-02-20 DIAGNOSIS — E108 Type 1 diabetes mellitus with unspecified complications: Secondary | ICD-10-CM | POA: Diagnosis not present

## 2018-02-26 ENCOUNTER — Encounter: Payer: Self-pay | Admitting: Pulmonary Disease

## 2018-02-26 DIAGNOSIS — G4733 Obstructive sleep apnea (adult) (pediatric): Secondary | ICD-10-CM

## 2018-04-05 ENCOUNTER — Encounter: Payer: Self-pay | Admitting: Family Medicine

## 2018-04-05 ENCOUNTER — Other Ambulatory Visit: Payer: Self-pay | Admitting: *Deleted

## 2018-04-05 DIAGNOSIS — E349 Endocrine disorder, unspecified: Secondary | ICD-10-CM

## 2018-04-05 DIAGNOSIS — E559 Vitamin D deficiency, unspecified: Secondary | ICD-10-CM

## 2018-04-05 DIAGNOSIS — E7800 Pure hypercholesterolemia, unspecified: Secondary | ICD-10-CM

## 2018-04-05 DIAGNOSIS — D696 Thrombocytopenia, unspecified: Secondary | ICD-10-CM

## 2018-04-05 DIAGNOSIS — Z0000 Encounter for general adult medical examination without abnormal findings: Secondary | ICD-10-CM

## 2018-04-05 DIAGNOSIS — E10649 Type 1 diabetes mellitus with hypoglycemia without coma: Secondary | ICD-10-CM

## 2018-04-05 DIAGNOSIS — I10 Essential (primary) hypertension: Secondary | ICD-10-CM

## 2018-04-05 DIAGNOSIS — N400 Enlarged prostate without lower urinary tract symptoms: Secondary | ICD-10-CM

## 2018-04-17 ENCOUNTER — Other Ambulatory Visit: Payer: BLUE CROSS/BLUE SHIELD

## 2018-04-17 DIAGNOSIS — N400 Enlarged prostate without lower urinary tract symptoms: Secondary | ICD-10-CM | POA: Diagnosis not present

## 2018-04-17 DIAGNOSIS — E349 Endocrine disorder, unspecified: Secondary | ICD-10-CM

## 2018-04-17 DIAGNOSIS — E7800 Pure hypercholesterolemia, unspecified: Secondary | ICD-10-CM

## 2018-04-17 DIAGNOSIS — E559 Vitamin D deficiency, unspecified: Secondary | ICD-10-CM | POA: Diagnosis not present

## 2018-04-17 DIAGNOSIS — Z0000 Encounter for general adult medical examination without abnormal findings: Secondary | ICD-10-CM

## 2018-04-17 DIAGNOSIS — D696 Thrombocytopenia, unspecified: Secondary | ICD-10-CM

## 2018-04-17 DIAGNOSIS — I10 Essential (primary) hypertension: Secondary | ICD-10-CM

## 2018-04-17 DIAGNOSIS — E10649 Type 1 diabetes mellitus with hypoglycemia without coma: Secondary | ICD-10-CM | POA: Diagnosis not present

## 2018-04-17 LAB — BAYER DCA HB A1C WAIVED: HB A1C: 4.8 % (ref <7.0)

## 2018-04-18 DIAGNOSIS — M25512 Pain in left shoulder: Secondary | ICD-10-CM | POA: Diagnosis not present

## 2018-04-18 DIAGNOSIS — Z96611 Presence of right artificial shoulder joint: Secondary | ICD-10-CM | POA: Diagnosis not present

## 2018-04-18 DIAGNOSIS — M25511 Pain in right shoulder: Secondary | ICD-10-CM | POA: Diagnosis not present

## 2018-04-18 DIAGNOSIS — Z96612 Presence of left artificial shoulder joint: Secondary | ICD-10-CM | POA: Diagnosis not present

## 2018-04-18 DIAGNOSIS — Z09 Encounter for follow-up examination after completed treatment for conditions other than malignant neoplasm: Secondary | ICD-10-CM | POA: Diagnosis not present

## 2018-04-18 LAB — BMP8+EGFR
BUN / CREAT RATIO: 15 (ref 10–24)
BUN: 14 mg/dL (ref 8–27)
CO2: 25 mmol/L (ref 20–29)
Calcium: 9.6 mg/dL (ref 8.6–10.2)
Chloride: 103 mmol/L (ref 96–106)
Creatinine, Ser: 0.91 mg/dL (ref 0.76–1.27)
GFR calc Af Amer: 100 mL/min/{1.73_m2} (ref >59)
GFR calc non Af Amer: 86 mL/min/{1.73_m2} (ref >59)
GLUCOSE: 66 mg/dL (ref 65–99)
POTASSIUM: 4.8 mmol/L (ref 3.5–5.2)
SODIUM: 144 mmol/L (ref 134–144)

## 2018-04-18 LAB — CBC WITH DIFFERENTIAL/PLATELET
BASOS: 1 %
Basophils Absolute: 0 10*3/uL (ref 0.0–0.2)
EOS (ABSOLUTE): 0.2 10*3/uL (ref 0.0–0.4)
Eos: 3 %
Hematocrit: 48.1 % (ref 37.5–51.0)
Hemoglobin: 15.7 g/dL (ref 13.0–17.7)
IMMATURE GRANULOCYTES: 0 %
Immature Grans (Abs): 0 10*3/uL (ref 0.0–0.1)
LYMPHS ABS: 1.4 10*3/uL (ref 0.7–3.1)
Lymphs: 24 %
MCH: 32.6 pg (ref 26.6–33.0)
MCHC: 32.6 g/dL (ref 31.5–35.7)
MCV: 100 fL — AB (ref 79–97)
MONOS ABS: 1.1 10*3/uL — AB (ref 0.1–0.9)
Monocytes: 19 %
NEUTROS PCT: 53 %
Neutrophils Absolute: 3 10*3/uL (ref 1.4–7.0)
PLATELETS: 160 10*3/uL (ref 150–450)
RBC: 4.82 x10E6/uL (ref 4.14–5.80)
RDW: 14.8 % (ref 12.3–15.4)
WBC: 5.6 10*3/uL (ref 3.4–10.8)

## 2018-04-18 LAB — HEPATIC FUNCTION PANEL
ALBUMIN: 3.9 g/dL (ref 3.6–4.8)
ALK PHOS: 62 IU/L (ref 39–117)
ALT: 40 IU/L (ref 0–44)
AST: 40 IU/L (ref 0–40)
BILIRUBIN TOTAL: 0.6 mg/dL (ref 0.0–1.2)
Bilirubin, Direct: 0.24 mg/dL (ref 0.00–0.40)
Total Protein: 7.1 g/dL (ref 6.0–8.5)

## 2018-04-18 LAB — LIPID PANEL
CHOL/HDL RATIO: 2.1 ratio (ref 0.0–5.0)
Cholesterol, Total: 104 mg/dL (ref 100–199)
HDL: 49 mg/dL (ref >39)
LDL CALC: 45 mg/dL (ref 0–99)
Triglycerides: 49 mg/dL (ref 0–149)
VLDL Cholesterol Cal: 10 mg/dL (ref 5–40)

## 2018-04-18 LAB — PSA, TOTAL AND FREE
PSA, Free Pct: 31.4 %
PSA, Free: 0.44 ng/mL
Prostate Specific Ag, Serum: 1.4 ng/mL (ref 0.0–4.0)

## 2018-04-18 LAB — TESTOSTERONE,FREE AND TOTAL
TESTOSTERONE: 543 ng/dL (ref 264–916)
Testosterone, Free: 7.3 pg/mL (ref 6.6–18.1)

## 2018-04-18 LAB — VITAMIN D 25 HYDROXY (VIT D DEFICIENCY, FRACTURES): Vit D, 25-Hydroxy: 75.7 ng/mL (ref 30.0–100.0)

## 2018-05-04 ENCOUNTER — Encounter: Payer: Self-pay | Admitting: Family Medicine

## 2018-05-04 ENCOUNTER — Ambulatory Visit (INDEPENDENT_AMBULATORY_CARE_PROVIDER_SITE_OTHER): Payer: BLUE CROSS/BLUE SHIELD

## 2018-05-04 ENCOUNTER — Ambulatory Visit: Payer: BLUE CROSS/BLUE SHIELD | Admitting: Family Medicine

## 2018-05-04 VITALS — BP 99/56 | HR 81 | Temp 97.4°F | Ht 70.0 in | Wt 252.0 lb

## 2018-05-04 DIAGNOSIS — Z0000 Encounter for general adult medical examination without abnormal findings: Secondary | ICD-10-CM | POA: Diagnosis not present

## 2018-05-04 DIAGNOSIS — M25572 Pain in left ankle and joints of left foot: Secondary | ICD-10-CM | POA: Diagnosis not present

## 2018-05-04 DIAGNOSIS — D696 Thrombocytopenia, unspecified: Secondary | ICD-10-CM

## 2018-05-04 DIAGNOSIS — N400 Enlarged prostate without lower urinary tract symptoms: Secondary | ICD-10-CM

## 2018-05-04 DIAGNOSIS — E7800 Pure hypercholesterolemia, unspecified: Secondary | ICD-10-CM

## 2018-05-04 DIAGNOSIS — K7581 Nonalcoholic steatohepatitis (NASH): Secondary | ICD-10-CM

## 2018-05-04 DIAGNOSIS — I10 Essential (primary) hypertension: Secondary | ICD-10-CM

## 2018-05-04 DIAGNOSIS — E10649 Type 1 diabetes mellitus with hypoglycemia without coma: Secondary | ICD-10-CM

## 2018-05-04 DIAGNOSIS — K7460 Unspecified cirrhosis of liver: Secondary | ICD-10-CM

## 2018-05-04 DIAGNOSIS — E349 Endocrine disorder, unspecified: Secondary | ICD-10-CM

## 2018-05-04 DIAGNOSIS — E559 Vitamin D deficiency, unspecified: Secondary | ICD-10-CM

## 2018-05-04 DIAGNOSIS — E1069 Type 1 diabetes mellitus with other specified complication: Secondary | ICD-10-CM

## 2018-05-04 DIAGNOSIS — E785 Hyperlipidemia, unspecified: Secondary | ICD-10-CM

## 2018-05-04 DIAGNOSIS — J329 Chronic sinusitis, unspecified: Secondary | ICD-10-CM

## 2018-05-04 DIAGNOSIS — I700 Atherosclerosis of aorta: Secondary | ICD-10-CM

## 2018-05-04 DIAGNOSIS — E1040 Type 1 diabetes mellitus with diabetic neuropathy, unspecified: Secondary | ICD-10-CM

## 2018-05-04 MED ORDER — AMOXICILLIN 500 MG PO CAPS: 500.0000 mg | ORAL_CAPSULE | Freq: Three times a day (TID) | ORAL | 0 refills | Status: DC

## 2018-05-04 NOTE — Progress Notes (Signed)
Subjective:    Patient ID: Tony Cantu, male    DOB: 1951/07/19, 67 y.o.   MRN: [id number]  HPI Patient is here today for annual wellness exam and follow up of chronic medical problems which includes diabetes and hypertension. He is taking medication regularly.  The family history is positive for asthma diabetes Alzheimer's heart disease and suicide.  Patient today complains with some left ankle pain since May.  He also has had some cold and congestion.  He recently had lab work done and this will be reviewed with him during the visit today.  The PSA remains low and stable at 1.4.  The hemoglobin A1c was excellent at 4.8%.  He has very tight control of his blood sugar and has to be careful for hypoglycemia.  Testosterone levels were good and within normal limits with a total being 543 and the free direct testosterone being 7.3.  All liver function tests were normal.  The vitamin D level was excellent at 75.7.  Cholesterol numbers were also excellent with an LDL C cholesterol being 45 good cholesterol being good at 49 and triglycerides good at 49.  The blood sugar was 66.  The creatinine and all of the electrolytes including sodium potassium and chloride were normal.  CBC had a normal white blood cell count with a stable hemoglobin at 15.7 and an adequate platelet count.  Patient is pleasant and feeling well today.  He denies any chest pain pressure tightness or shortness of breath anymore than usual.  His coughing game is limited to using no more than 9 holes.  He does have a lot of shoulder problems because of bilateral shoulder replacement.  Back in May he stepped into a blocker and somehow hurt his left ankle and it still does not seem to want to get better since that time so we will get an x-ray today and encouraged him to use Tylenol and an Ace wrap and warm wet compresses.  No other joint problems noted by the patient.  He denies nausea vomiting diarrhea blood in the stool black tarry bowel movements  or change in bowel habits.  His last colonoscopy was by Dr. Amedeo Plenty who has retired in 2015 and we will double check and make sure that he is not due another one until 10 years after that time.  He is passing his water well and the Lasix works well on him with increased frequency after taking it.   Patient Active Problem List   Diagnosis Date Noted  . Paroxysmal tachycardia ([place]) 01/11/2017  . Nonrheumatic aortic valve stenosis 01/11/2017  . Type 1 diabetes mellitus without complication ([place]) 56/25/6389  . Abdominal aortic atherosclerosis ([place]) 11/23/2016  . HNP (herniated nucleus pulposus), lumbar 10/06/2016  . Acute renal injury ([place]) 08/06/2016  . Diarrhea 08/06/2016  . Fever 08/06/2016  . Hyperbilirubinemia 08/06/2016  . Septic shock ([place]) 08/06/2016  . Lactic acidosis 08/06/2016  . Type 2 diabetes mellitus with hyperlipidemia ([place]) 01/18/2016  . Inguinal hernia 12/10/2015  . Right groin pain 11/30/2015  . Thrombocytopenia ([place]) 07/21/2014  . Bilateral carotid bruits 05/11/2014  . Vitamin D deficiency 09/17/2013  . BPH (benign prostatic hyperplasia) 05/22/2013  . 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 Diabetes type 2, controlled ([place]) 01/10/2013  . Essential hypertension, benign 01/10/2013  . OSA (obstructive sleep apnea) 05/16/2012  . Edema 02/21/2012  . At risk for coronary artery disease 03/20/2011  . Obesity 03/20/2011  . WEIGHT GAIN, ABNORMAL 11/09/2010  . Hyperlipidemia 06/22/2010  .  ALLERGIC RHINITIS 06/22/2010  . ASTHMA 06/22/2010  . COUGH 06/22/2010   Outpatient Encounter Medications as of 05/04/2018  Medication Sig  . albuterol (PROVENTIL HFA;VENTOLIN HFA) 108 (90 Base) MCG/ACT inhaler Inhale 2 puffs into the lungs every 6 (six) hours as needed for wheezing or shortness of breath.  Marland Kitchen aspirin EC 81 MG tablet Take 81 mg by mouth daily with breakfast.   . atorvastatin (LIPITOR) 40 MG tablet TAKE 1 TABLET BY MOUTH  DAILY  . b complex vitamins tablet Take 1 tablet by  mouth daily at 12 noon.   . Calcium Carbonate (CALTRATE 600 PO) Take 1 tablet by mouth daily at 12 noon.   . Cholecalciferol (VITAMIN D3) 2000 UNITS capsule Take 2,000 Units by mouth daily at 12 noon.   . Continuous Blood Gluc Receiver (FREESTYLE LIBRE READER) DEVI 1 applicator by Does not apply route as directed.  . Continuous Blood Gluc Sensor (FREESTYLE LIBRE SENSOR SYSTEM) MISC Check BS eight (8) times a day. Dx E10.9  . fexofenadine (ALLEGRA) 180 MG tablet Take 1 tablet (180 mg total) by mouth daily with breakfast.  . Flaxseed, Linseed, (FLAX SEED OIL PO) Take 1 capsule by mouth daily with breakfast.   . furosemide (LASIX) 20 MG tablet TAKE 1 AND 1/2 TABLETS BY  MOUTH DAILY  . GLUCAGON EMERGENCY 1 MG injection INJECT 1MG INTO SKIN ONCE  AS NEEDED  . Glucosamine-Chondroit-Vit C-Mn (GLUCOSAMINE 1500 COMPLEX) CAPS Take 2 capsules by mouth daily at 12 noon.   Marland Kitchen HUMALOG 100 UNIT/ML injection INJECT 20 TO 50 UNITS  SUBCUTANEOUSLY 3 TIMES  DAILY BEFORE MEALS PER SLIDING SCALE  . Insulin Glargine (TOUJEO MAX SOLOSTAR) 300 UNIT/ML SOPN Inject 120 Units into the skin daily. Start with 96 units and adjust according to BG results.  Do no increase more than 2 units every 4 days  . montelukast (SINGULAIR) 10 MG tablet TAKE 1 TABLET BY MOUTH AT  BEDTIME  . multivitamin-lutein (OCUVITE-LUTEIN) CAPS capsule Take 1 capsule by mouth daily at 12 noon.  . pantoprazole (PROTONIX) 40 MG tablet TAKE 1 TABLET BY MOUTH  DAILY  . ranitidine (ZANTAC) 75 MG tablet Take 1 tablet (75 mg total) by mouth daily with breakfast.  . sildenafil (REVATIO) 20 MG tablet TAKE 2-5 TABLETS AS NEEDED PRIOR TO SEXUAL ACTIVITY  . Testosterone 30 MG/ACT SOLN USE 1 PUMP UNDER EACH  AXILLA DAILY (TOTAL OF 60MG DAILY)  . traMADol (ULTRAM) 50 MG tablet Take 1 tablet (50 mg total) by mouth every 6 (six) hours as needed.  . triamcinolone acetonide 40 MG/ML SUSP 40 mg, mupirocin cream 2 % CREA 15 application Apply 1 application topically 2 (two)  times daily.  . valsartan (DIOVAN) 160 MG tablet TAKE 1 TABLET BY MOUTH  DAILY  . [DISCONTINUED] ciprofloxacin (CIPRO) 500 MG tablet Take 1 tablet (500 mg total) by mouth 2 (two) times daily.  . [DISCONTINUED] Insulin Syringe-Needle U-100 (BD INSULIN SYRINGE ULTRAFINE) 31G X 5/16" 0.5 ML MISC USE TO INJECT 8 TO 10 TIMES DAILY, dx E11.9  . [DISCONTINUED] ONETOUCH DELICA LANCETS 24P MISC CHECK BLOOD SUGAR 8 TIMES  DAILY   No facility-administered encounter medications on file as of 05/04/2018.       Review of Systems  Constitutional: Negative.   HENT: Positive for congestion (sinus issues ).   Eyes: Negative.   Respiratory: Negative.   Cardiovascular: Negative.   Gastrointestinal: Negative.   Endocrine: Negative.   Genitourinary: Negative.   Musculoskeletal: Positive for arthralgias (left ankle pain since May ).  Skin: Negative.   Allergic/Immunologic: Negative.   Neurological: Negative.   Hematological: Negative.   Psychiatric/Behavioral: Negative.        Objective:   Physical Exam  Constitutional: He is oriented to person, place, and time. He appears well-developed and well-nourished. No distress.  The patient is pleasant and alert and has [place] for keeping his blood sugar low but I told him he needed to make some adjustments so that we do not have problems with hypoglycemia.  HENT:  Head: Normocephalic and atraumatic.  Right Ear: External ear normal.  Left Ear: External ear normal.  Mouth/Throat: Oropharynx is clear and moist. No oropharyngeal exudate.  Nasal turbinate congestion bilaterally throat red posteriorly with no anterior cervical adenopathy  Eyes: Pupils are equal, round, and reactive to light. Conjunctivae and EOM are normal. Right eye exhibits no discharge. Left eye exhibits no discharge. No scleral icterus.  Up-to-date on eye exams  Neck: Normal range of motion. Neck supple. No thyromegaly present.  No  thyromegaly or anterior cervical adenopathy, there are  bilateral carotid bruits which I think is secondary to the systolic ejection murmur in his heart.  Cardiovascular: Normal rate, regular rhythm and intact distal pulses.  Murmur heard. The heart is regular at 72/min with a grade 3/6 systolic ejection murmur.  Pulmonary/Chest: Effort normal and breath sounds normal. No respiratory distress. He has no wheezes. He has no rales. He exhibits no tenderness.  Lungs are clear anteriorly and posteriorly with no axillary adenopathy or chest Fore masses.  Abdominal: Soft. Bowel sounds are normal. He exhibits no mass. There is no tenderness. There is no rebound.  No liver or spleen enlargement no masses no bruits no tenderness and no inguinal adenopathy.  Genitourinary:  Genitourinary Comments: This was checked the last visit.  Musculoskeletal: He exhibits no edema or tenderness.  Limited range of motion of both shoulders secondary to bilateral shoulder replacement surgery.  Lymphadenopathy:    He has no cervical adenopathy.  Neurological: He is alert and oriented to person, place, and time. He has normal reflexes. No cranial nerve deficit.  Diminished sensation to the plantar surface of the great toe of the right foot.  Skin: Skin is warm and dry. No rash noted. No pallor.  Bilateral calluses of both feet.  Psychiatric: He has a normal mood and affect. His behavior is normal. Judgment and thought content normal.  The patient is alert with normal mood affect and behavior.  Nursing note and vitals reviewed.   BP (!) 99/56 (BP Location: Left Arm)   Pulse 81   Temp (!) 97.4 F (36.3 C) (Oral)   Ht 5' 10"  (1.778 m)   Wt 252 lb (114.3 kg)   BMI 36.16 kg/m        Assessment & Plan:  1. Type 1 diabetes mellitus with hypoglycemia, with long-term current use of insulin ([place]) -Continue with current management but reduce insulin dose so that we do not have any problems with hypoglycemia.  Patient should check blood sugars regularly and have a snack  available for any episodes of hypoglycemia.  He should check feet regularly.  2. Pure hypercholesterolemia -All cholesterol numbers were excellent he will continue with current treatment  3. Essential hypertension -Blood pressure is excellent he will continue with current treatment  4. Vitamin D deficiency -10 you with vitamin D replacement  5. Thrombocytopenia (H[facility]) -Platelet count this time was normal  6. Testosterone deficiency -Testosterone levels were good for patient on testosterone replacement  7. Benign prostatic  hyperplasia, unspecified whether lower urinary tract symptoms present -Other than some frequency which she attributes to the Lasix no complaints with voiding.  8. Annual physical exam -Check on the day of the next colonoscopy and follow-up with orthopedist as planned  9. Morbid obesity ([place]) -Continue to work aggressively on weight loss through diet and exercise  10. Hyperlipidemia due to type 1 diabetes mellitus ([place]) -Continue current treatment and most recent cholesterol numbers were excellent.  11. Liver cirrhosis secondary to NASH (nonalcoholic steatohepatitis) ([place]) -All liver function tests were normal  12. Aortic atherosclerosis (HCC) -Continue with statin therapy and aggressive therapeutic lifestyle changes  13. Type 1 diabetes mellitus with diabetic neuropathy, unspecified ([place]) -Continue with healthy diet and regular exercise and current treatment with close monitoring due to worries about hypoglycemia  14. Rhinosinusitis -Amoxicillin 503 times daily for 10 days  15.  Left ankle pain -X-ray today use warm wet compresses and use Ace bandage support  Meds ordered this encounter  Medications  . amoxicillin (AMOXIL) 500 MG capsule    Sig: Take 1 capsule (500 mg total) by mouth 3 (three) times daily.    Dispense:  30 capsule    Refill:  0   Patient Instructions                       Medicare Annual Wellness Visit  [facility] and the  medical providers at [facility] strive to bring you the best medical care.  In doing so we not only want to address your current medical conditions and concerns but also to detect new conditions early and prevent illness, disease and health-related problems.    Medicare offers a yearly Wellness Visit which allows our clinical staff to assess your need for preventative services including immunizations, lifestyle education, counseling to decrease risk of preventable diseases and screening for fall risk and other medical concerns.    This visit is provided free of charge (no copay) for all Medicare recipients. The clinical pharmacists at [facility] have begun to conduct these Wellness Visits which will also include a thorough review of all your medications.    As you primary medical provider recommend that you make an appointment for your Annual Wellness Visit if you have not done so already this year.  You may set up this appointment before you leave today or you may call back ([phone]) and schedule an appointment.  Please make sure when you call that you mention that you are scheduling your Annual Wellness Visit with the clinical pharmacist so that the appointment may be made for the proper length of time.     Continue current medications. Continue good therapeutic lifestyle changes which include good diet and exercise. Fall precautions discussed with patient. If an FOBT was given today- please return it to our front desk. If you are over 6 years old - you may need Prevnar 45 or the adult Pneumonia vaccine.  **Flu shots are available--- please call and schedule a FLU-CLINIC appointment**  After your visit with [place] today you will receive a survey in the mail or online from [employer] regarding your care with [place]. Please take a moment to fill this out. Your feedback is very important to [place] as you can help [place] better understand your patient needs as well  as improve your experience and satisfaction. WE CARE ABOUT YOU!!!   Check blood sugars closely because of being on insulin and having a low hemoglobin A1c  for diabetic. Remember that the big toe on the right foot had decreased sensation Check your feet regularly for infection We will try to find information from the last colonoscopy to discern if your next one is due 10 years after that or 5 years after that. Please get back in touch with [place] regard to that. Drink plenty of fluids and stay well-hydrated Take amoxicillin 500 3 times a day for sinus infection and congestion Use nasal saline   Arrie Senate MD

## 2018-05-04 NOTE — Patient Instructions (Addendum)
Medicare Annual Wellness Visit  [facility] and the medical providers at [facility] strive to bring you the best medical care.  In doing so we not only want to address your current medical conditions and concerns but also to detect new conditions early and prevent illness, disease and health-related problems.    Medicare offers a yearly Wellness Visit which allows our clinical staff to assess your need for preventative services including immunizations, lifestyle education, counseling to decrease risk of preventable diseases and screening for fall risk and other medical concerns.    This visit is provided free of charge (no copay) for all Medicare recipients. The clinical pharmacists at [facility] have begun to conduct these Wellness Visits which will also include a thorough review of all your medications.    As you primary medical provider recommend that you make an appointment for your Annual Wellness Visit if you have not done so already this year.  You may set up this appointment before you leave today or you may call back ([phone]) and schedule an appointment.  Please make sure when you call that you mention that you are scheduling your Annual Wellness Visit with the clinical pharmacist so that the appointment may be made for the proper length of time.     Continue current medications. Continue good therapeutic lifestyle changes which include good diet and exercise. Fall precautions discussed with patient. If an FOBT was given today- please return it to our front desk. If you are over 14 years old - you may need Prevnar 47 or the adult Pneumonia vaccine.  **Flu shots are available--- please call and schedule a FLU-CLINIC appointment**  After your visit with [place] today you will receive a survey in the mail or online from [employer] regarding your care with [place]. Please take a moment to fill this out. Your feedback is very  important to [place] as you can help [place] better understand your patient needs as well as improve your experience and satisfaction. WE CARE ABOUT YOU!!!   Check blood sugars closely because of being on insulin and having a low hemoglobin A1c for diabetic. Remember that the big toe on the right foot had decreased sensation Check your feet regularly for infection We will try to find information from the last colonoscopy to discern if your next one is due 10 years after that or 5 years after that. Please get back in touch with [place] regard to that. Drink plenty of fluids and stay well-hydrated Take amoxicillin 500 3 times a day for sinus infection and congestion Use nasal saline

## 2018-05-10 DIAGNOSIS — M1711 Unilateral primary osteoarthritis, right knee: Secondary | ICD-10-CM | POA: Diagnosis not present

## 2018-05-10 DIAGNOSIS — M1712 Unilateral primary osteoarthritis, left knee: Secondary | ICD-10-CM | POA: Diagnosis not present

## 2018-05-11 ENCOUNTER — Other Ambulatory Visit: Payer: Self-pay | Admitting: Family Medicine

## 2018-05-15 ENCOUNTER — Other Ambulatory Visit: Payer: Self-pay | Admitting: Family Medicine

## 2018-05-15 DIAGNOSIS — G4733 Obstructive sleep apnea (adult) (pediatric): Secondary | ICD-10-CM | POA: Diagnosis not present

## 2018-05-18 DIAGNOSIS — E108 Type 1 diabetes mellitus with unspecified complications: Secondary | ICD-10-CM | POA: Diagnosis not present

## 2018-05-22 ENCOUNTER — Encounter: Payer: Self-pay | Admitting: Family Medicine

## 2018-05-22 DIAGNOSIS — Z23 Encounter for immunization: Secondary | ICD-10-CM | POA: Diagnosis not present

## 2018-05-23 DIAGNOSIS — Z794 Long term (current) use of insulin: Secondary | ICD-10-CM | POA: Diagnosis not present

## 2018-05-23 DIAGNOSIS — E108 Type 1 diabetes mellitus with unspecified complications: Secondary | ICD-10-CM | POA: Diagnosis not present

## 2018-05-30 NOTE — Telephone Encounter (Signed)
FYI for Dr Tony Cantu- Tony Cantu  had asked a few weeks ago about the SoClean CPAP cleaners and Tony Cantu thought Tony Cantu'd inform you Tony Cantu did not get one. Not the SoClean anyway. Tony Cantu started getting ads after looking online and several companies make cleaning devices for $100 or less all using ozone cleaning just like SoClean. Tony Cantu bought a Respify unit for less than $80 and am very satisfied with it. Might pass it along to patients that ask as several different ones are available.

## 2018-06-12 DIAGNOSIS — M1712 Unilateral primary osteoarthritis, left knee: Secondary | ICD-10-CM | POA: Diagnosis not present

## 2018-06-28 DIAGNOSIS — M1712 Unilateral primary osteoarthritis, left knee: Secondary | ICD-10-CM | POA: Diagnosis not present

## 2018-07-05 DIAGNOSIS — M1712 Unilateral primary osteoarthritis, left knee: Secondary | ICD-10-CM | POA: Diagnosis not present

## 2018-07-22 ENCOUNTER — Other Ambulatory Visit: Payer: Self-pay | Admitting: Family Medicine

## 2018-08-09 NOTE — Telephone Encounter (Signed)
Received mychart message from pt stating he still had not received the new soft mask for his cpap. Pt stated he called [facility] 07/30/18 and was told by them that someone would be calling him to schedule an appt for fitting of new mask and headgear but pt stated he never received a call.  I called [facility] and spoke with Jeani Hawking in regards to this. Per Jeani Hawking, she is going to discuss this with Careers adviser and per Jeani Hawking, pt will be contacted today. Nothing further needed.

## 2018-08-17 DIAGNOSIS — E108 Type 1 diabetes mellitus with unspecified complications: Secondary | ICD-10-CM | POA: Diagnosis not present

## 2018-08-20 DIAGNOSIS — G4733 Obstructive sleep apnea (adult) (pediatric): Secondary | ICD-10-CM | POA: Diagnosis not present

## 2018-08-21 DIAGNOSIS — Z794 Long term (current) use of insulin: Secondary | ICD-10-CM | POA: Diagnosis not present

## 2018-08-21 DIAGNOSIS — E108 Type 1 diabetes mellitus with unspecified complications: Secondary | ICD-10-CM | POA: Diagnosis not present

## 2018-09-03 ENCOUNTER — Encounter: Payer: Self-pay | Admitting: Family Medicine

## 2018-09-11 ENCOUNTER — Other Ambulatory Visit: Payer: BLUE CROSS/BLUE SHIELD

## 2018-09-11 DIAGNOSIS — I10 Essential (primary) hypertension: Secondary | ICD-10-CM

## 2018-09-11 DIAGNOSIS — E10649 Type 1 diabetes mellitus with hypoglycemia without coma: Secondary | ICD-10-CM

## 2018-09-11 DIAGNOSIS — D696 Thrombocytopenia, unspecified: Secondary | ICD-10-CM

## 2018-09-11 DIAGNOSIS — N400 Enlarged prostate without lower urinary tract symptoms: Secondary | ICD-10-CM

## 2018-09-11 DIAGNOSIS — E559 Vitamin D deficiency, unspecified: Secondary | ICD-10-CM | POA: Diagnosis not present

## 2018-09-11 DIAGNOSIS — E7800 Pure hypercholesterolemia, unspecified: Secondary | ICD-10-CM

## 2018-09-11 DIAGNOSIS — E349 Endocrine disorder, unspecified: Secondary | ICD-10-CM

## 2018-09-11 LAB — BAYER DCA HB A1C WAIVED: HB A1C: 5.3 % (ref <7.0)

## 2018-09-12 LAB — LIPID PANEL
Chol/HDL Ratio: 2.1 ratio (ref 0.0–5.0)
Cholesterol, Total: 103 mg/dL (ref 100–199)
HDL: 49 mg/dL (ref >39)
LDL Calculated: 43 mg/dL (ref 0–99)
Triglycerides: 54 mg/dL (ref 0–149)
VLDL CHOLESTEROL CAL: 11 mg/dL (ref 5–40)

## 2018-09-12 LAB — CBC WITH DIFFERENTIAL/PLATELET
BASOS: 1 %
Basophils Absolute: 0.1 10*3/uL (ref 0.0–0.2)
EOS (ABSOLUTE): 0.2 10*3/uL (ref 0.0–0.4)
EOS: 2 %
HEMATOCRIT: 46.9 % (ref 37.5–51.0)
Hemoglobin: 15.7 g/dL (ref 13.0–17.7)
Immature Grans (Abs): 0 10*3/uL (ref 0.0–0.1)
Immature Granulocytes: 0 %
LYMPHS ABS: 1.7 10*3/uL (ref 0.7–3.1)
Lymphs: 24 %
MCH: 32.2 pg (ref 26.6–33.0)
MCHC: 33.5 g/dL (ref 31.5–35.7)
MCV: 96 fL (ref 79–97)
MONOS ABS: 1.2 10*3/uL — AB (ref 0.1–0.9)
Monocytes: 17 %
NEUTROS PCT: 56 %
Neutrophils Absolute: 3.9 10*3/uL (ref 1.4–7.0)
Platelets: 156 10*3/uL (ref 150–450)
RBC: 4.88 x10E6/uL (ref 4.14–5.80)
RDW: 13.6 % (ref 11.6–15.4)
WBC: 7.1 10*3/uL (ref 3.4–10.8)

## 2018-09-12 LAB — BMP8+EGFR
BUN / CREAT RATIO: 15 (ref 10–24)
BUN: 14 mg/dL (ref 8–27)
CO2: 24 mmol/L (ref 20–29)
CREATININE: 0.92 mg/dL (ref 0.76–1.27)
Calcium: 9.5 mg/dL (ref 8.6–10.2)
Chloride: 99 mmol/L (ref 96–106)
GFR calc Af Amer: 98 mL/min/{1.73_m2} (ref >59)
GFR, EST NON AFRICAN AMERICAN: 85 mL/min/{1.73_m2} (ref >59)
GLUCOSE: 120 mg/dL — AB (ref 65–99)
POTASSIUM: 4 mmol/L (ref 3.5–5.2)
SODIUM: 138 mmol/L (ref 134–144)

## 2018-09-12 LAB — HEPATIC FUNCTION PANEL
ALT: 43 IU/L (ref 0–44)
AST: 40 IU/L (ref 0–40)
Albumin: 4 g/dL (ref 3.6–4.8)
Alkaline Phosphatase: 58 IU/L (ref 39–117)
BILIRUBIN TOTAL: 0.8 mg/dL (ref 0.0–1.2)
Bilirubin, Direct: 0.28 mg/dL (ref 0.00–0.40)
TOTAL PROTEIN: 6.8 g/dL (ref 6.0–8.5)

## 2018-09-12 LAB — VITAMIN D 25 HYDROXY (VIT D DEFICIENCY, FRACTURES): VIT D 25 HYDROXY: 57.4 ng/mL (ref 30.0–100.0)

## 2018-09-18 DIAGNOSIS — G4733 Obstructive sleep apnea (adult) (pediatric): Secondary | ICD-10-CM | POA: Diagnosis not present

## 2018-09-19 ENCOUNTER — Ambulatory Visit: Payer: BLUE CROSS/BLUE SHIELD | Admitting: Family Medicine

## 2018-09-19 ENCOUNTER — Encounter: Payer: Self-pay | Admitting: Family Medicine

## 2018-09-19 VITALS — BP 106/56 | HR 64 | Temp 97.4°F | Ht 70.0 in | Wt 252.0 lb

## 2018-09-19 DIAGNOSIS — D696 Thrombocytopenia, unspecified: Secondary | ICD-10-CM

## 2018-09-19 DIAGNOSIS — E10649 Type 1 diabetes mellitus with hypoglycemia without coma: Secondary | ICD-10-CM | POA: Diagnosis not present

## 2018-09-19 DIAGNOSIS — J340 Abscess, furuncle and carbuncle of nose: Secondary | ICD-10-CM

## 2018-09-19 DIAGNOSIS — E559 Vitamin D deficiency, unspecified: Secondary | ICD-10-CM

## 2018-09-19 DIAGNOSIS — E7800 Pure hypercholesterolemia, unspecified: Secondary | ICD-10-CM

## 2018-09-19 DIAGNOSIS — I10 Essential (primary) hypertension: Secondary | ICD-10-CM

## 2018-09-19 DIAGNOSIS — E349 Endocrine disorder, unspecified: Secondary | ICD-10-CM

## 2018-09-19 DIAGNOSIS — E1142 Type 2 diabetes mellitus with diabetic polyneuropathy: Secondary | ICD-10-CM

## 2018-09-19 DIAGNOSIS — I700 Atherosclerosis of aorta: Secondary | ICD-10-CM

## 2018-09-19 DIAGNOSIS — N400 Enlarged prostate without lower urinary tract symptoms: Secondary | ICD-10-CM

## 2018-09-19 MED ORDER — CEPHALEXIN 500 MG PO CAPS: 500.0000 mg | ORAL_CAPSULE | Freq: Three times a day (TID) | ORAL | 0 refills | Status: DC

## 2018-09-19 MED ORDER — MUPIROCIN 2 % EX OINT: 1.0000 "application " | TOPICAL_OINTMENT | Freq: Two times a day (BID) | CUTANEOUS | 0 refills | Status: DC

## 2018-09-19 NOTE — Patient Instructions (Addendum)
Medicare Annual Wellness Visit  [facility] and the medical providers at [facility] strive to bring you the best medical care.  In doing so we not only want to address your current medical conditions and concerns but also to detect new conditions early and prevent illness, disease and health-related problems.    Medicare offers a yearly Wellness Visit which allows our clinical staff to assess your need for preventative services including immunizations, lifestyle education, counseling to decrease risk of preventable diseases and screening for fall risk and other medical concerns.    This visit is provided free of charge (no copay) for all Medicare recipients. The clinical pharmacists at [facility] have begun to conduct these Wellness Visits which will also include a thorough review of all your medications.    As you primary medical provider recommend that you make an appointment for your Annual Wellness Visit if you have not done so already this year.  You may set up this appointment before you leave today or you may call back [phone]) and schedule an appointment.  Please make sure when you call that you mention that you are scheduling your Annual Wellness Visit with the clinical pharmacist so that the appointment may be made for the proper length of time.     Continue current medications. Continue good therapeutic lifestyle changes which include good diet and exercise. Fall precautions discussed with patient. If an FOBT was given today- please return it to our front desk. If you are over 64 years old - you may need Prevnar 40 or the adult Pneumonia vaccine.  **Flu shots are available--- please call and schedule a FLU-CLINIC appointment**  After your visit with [place] today you will receive a survey in the mail or online from [employer] regarding your care with [place]. Please take a moment to fill this out. Your feedback is very  important to [place] as you can help [place] better understand your patient needs as well as improve your experience and satisfaction. WE CARE ABOUT YOU!!!   Use mupirocin ointment and take antibiotic as directed for at least a week due to cellulitis of the nostril on the right side. Continue to follow-up with pulmonology and cardiology Continue to follow-up with orthopedics as planned

## 2018-09-19 NOTE — Progress Notes (Signed)
Subjective:    Patient ID: Tony Cantu, male    DOB: Oct 27, 1950, 68 y.o.   MRN: [id number]  HPI Pt here for follow up and management of chronic medical problems which includes diabetes and hypertension. He is taking medication regularly.  Patient is doing well overall.  He is having issues with his right shoulder and may have to have surgery soon.  He is seeing a Dr. Tamera Punt.  He will be given an FOBT to return today.  His last colonoscopy was in June 2015.  He has had lab work done this will be reviewed with him during the visit today.  His hemoglobin A1c was good at 5.3%.  The blood sugar was elevated at 120 with a normal creatinine and electrolytes.  The CBC had a normal white blood cell count a good hemoglobin at 15.7 and an adequate platelet count.  All cholesterol numbers with traditional lipid testing were excellent with triglycerides being good at 54 and an LDL-C being good at 43 and even the HDL was good at 49.  The vitamin D level was good at 57.4 all liver function tests were normal.  The patient's family history is positive for diabetes Alzheimer's disease heart disease and heart failure.  His father had asthma.  The patient has a history of having had both shoulders replaced in the past and he is having increased problems with mobility with the right being worse than the left and is looking at having surgery again on the right shoulder.  Today in general he denies any chest pain pressure tightness or shortness of breath anymore than usual and does see the cardiologist regularly in June for follow-up and may need repeat carotid Dopplers at that time.  He denies any increased episodes of shortness of breath but has a history of respiratory issues and this seems to run in the family.  He does see a pulmonologist on a yearly basis.  Dr. Halford Chessman.  He denies any trouble with swallowing heartburn indigestion nausea vomiting diarrhea blood in the stool black tarry bowel movements or change in bowel  habits.  He is passing his water without problems.  He is due to get an eye exam soon and gets this every January or February.  His vital signs are stable.  His BMI is 36.16 and he is therefore morbidly obese because of comorbid health conditions.     Patient Active Problem List   Diagnosis Date Noted  . Paroxysmal tachycardia ([place]) 01/11/2017  . Nonrheumatic aortic valve stenosis 01/11/2017  . Type 1 diabetes mellitus without complication ([place]) 48/88/9169  . Aortic atherosclerosis ([place]) 11/23/2016  . HNP (herniated nucleus pulposus), lumbar 10/06/2016  . Acute renal injury ([place]) 08/06/2016  . Diarrhea 08/06/2016  . Fever 08/06/2016  . Hyperbilirubinemia 08/06/2016  . Septic shock ([place]) 08/06/2016  . Lactic acidosis 08/06/2016  . Type 2 diabetes mellitus with hyperlipidemia ([place]) 01/18/2016  . Inguinal hernia 12/10/2015  . Right groin pain 11/30/2015  . Thrombocytopenia ([place]) 07/21/2014  . Bilateral carotid bruits 05/11/2014  . Vitamin D deficiency 09/17/2013  . BPH (benign prostatic hyperplasia) 05/22/2013  . 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 Diabetes type 2, controlled ([place]) 01/10/2013  . Essential hypertension, benign 01/10/2013  . OSA (obstructive sleep apnea) 05/16/2012  . Edema 02/21/2012  . At risk for coronary artery disease 03/20/2011  . Obesity 03/20/2011  . WEIGHT GAIN, ABNORMAL 11/09/2010  . Hyperlipidemia 06/22/2010  . ALLERGIC RHINITIS 06/22/2010  . ASTHMA 06/22/2010  . COUGH  06/22/2010   Outpatient Encounter Medications as of 09/19/2018  Medication Sig  . albuterol (PROVENTIL HFA;VENTOLIN HFA) 108 (90 Base) MCG/ACT inhaler Inhale 2 puffs into the lungs every 6 (six) hours as needed for wheezing or shortness of breath.  Marland Kitchen aspirin EC 81 MG tablet Take 81 mg by mouth daily with breakfast.   . atorvastatin (LIPITOR) 40 MG tablet TAKE 1 TABLET BY MOUTH  DAILY  . b complex vitamins tablet Take 1 tablet by mouth daily at 12 noon.   . Calcium Carbonate  (CALTRATE 600 PO) Take 1 tablet by mouth daily at 12 noon.   . Cholecalciferol (VITAMIN D3) 2000 UNITS capsule Take 2,000 Units by mouth daily at 12 noon.   . Continuous Blood Gluc Receiver (FREESTYLE LIBRE READER) DEVI 1 applicator by Does not apply route as directed.  . Continuous Blood Gluc Sensor (FREESTYLE LIBRE SENSOR SYSTEM) MISC Check BS eight (8) times a day. Dx E10.9  . fexofenadine (ALLEGRA) 180 MG tablet Take 1 tablet (180 mg total) by mouth daily with breakfast.  . Flaxseed, Linseed, (FLAX SEED OIL PO) Take 1 capsule by mouth daily with breakfast.   . furosemide (LASIX) 20 MG tablet TAKE 1 AND 1/2 TABLETS BY  MOUTH DAILY  . GLUCAGON EMERGENCY 1 MG injection INJECT 1MG INTO SKIN ONCE  AS NEEDED  . Glucosamine-Chondroit-Vit C-Mn (GLUCOSAMINE 1500 COMPLEX) CAPS Take 2 capsules by mouth daily at 12 noon.   Marland Kitchen HUMALOG 100 UNIT/ML injection INJECT 20 TO 50 UNITS  SUBCUTANEOUSLY 3 TIMES  DAILY BEFORE MEALS PER SLIDING SCALE  . Insulin Glargine (TOUJEO MAX SOLOSTAR) 300 UNIT/ML SOPN Inject 120 Units into the skin daily. Start with 96 units and adjust according to BG results.  Do no increase more than 2 units every 4 days  . montelukast (SINGULAIR) 10 MG tablet TAKE 1 TABLET BY MOUTH AT  BEDTIME  . multivitamin-lutein (OCUVITE-LUTEIN) CAPS capsule Take 1 capsule by mouth daily at 12 noon.  . pantoprazole (PROTONIX) 40 MG tablet TAKE 1 TABLET BY MOUTH  DAILY  . sildenafil (REVATIO) 20 MG tablet TAKE 2-5 TABLETS AS NEEDED PRIOR TO SEXUAL ACTIVITY  . Testosterone 30 MG/ACT SOLN USE 1 PUMP UNDER EACH  AXILLA DAILY (TOTAL OF 60MG DAILY)  . traMADol (ULTRAM) 50 MG tablet Take 1 tablet (50 mg total) by mouth every 6 (six) hours as needed.  . triamcinolone acetonide 40 MG/ML SUSP 40 mg, mupirocin cream 2 % CREA 15 application Apply 1 application topically 2 (two) times daily.  . valsartan (DIOVAN) 160 MG tablet TAKE 1 TABLET BY MOUTH  DAILY  . [DISCONTINUED] amoxicillin (AMOXIL) 500 MG capsule Take 1  capsule (500 mg total) by mouth 3 (three) times daily.  . [DISCONTINUED] ranitidine (ZANTAC) 75 MG tablet Take 1 tablet (75 mg total) by mouth daily with breakfast.   No facility-administered encounter medications on file as of 09/19/2018.      Review of Systems  Constitutional: Negative.   HENT: Negative.   Eyes: Negative.   Respiratory: Negative.   Cardiovascular: Negative.   Gastrointestinal: Negative.   Endocrine: Negative.   Genitourinary: Negative.   Musculoskeletal: Positive for arthralgias (shoulder pain Bilateral R>L  // bialateral knee pain - seen ortho ).  Skin: Negative.   Allergic/Immunologic: Negative.   Neurological: Negative.   Hematological: Negative.   Psychiatric/Behavioral: Negative.        Objective:   Physical Exam Vitals signs and nursing note reviewed.  Constitutional:      Appearance: Normal appearance. He  is well-developed. He is obese. He is not ill-appearing.  HENT:     Head: Normocephalic and atraumatic.     Right Ear: Tympanic membrane, ear canal and external ear normal. There is no impacted cerumen.     Left Ear: Tympanic membrane, ear canal and external ear normal. There is no impacted cerumen.     Nose: No congestion or rhinorrhea.     Comments: Nasal cellulitis right nostril secondary to CPAP apparatus    Mouth/Throat:     Mouth: Mucous membranes are dry.     Pharynx: Oropharynx is clear. Posterior oropharyngeal erythema present. No oropharyngeal exudate.     Comments: Slight redness posterior throat Eyes:     General: No scleral icterus.       Right eye: No discharge.        Left eye: No discharge.     Extraocular Movements: Extraocular movements intact.     Conjunctiva/sclera: Conjunctivae normal.     Pupils: Pupils are equal, round, and reactive to light.  Neck:     Musculoskeletal: Normal range of motion and neck supple. No neck rigidity or muscular tenderness.     Thyroid: No thyromegaly.     Vascular: Carotid bruit present.      Trachea: No tracheal deviation.     Comments: Bilateral carotid bruits and no thyromegaly or anterior cervical adenopathy Cardiovascular:     Rate and Rhythm: Normal rate and regular rhythm.     Pulses: Normal pulses.     Heart sounds: Murmur present. No friction rub. No gallop.      Comments: The heart is regular at 60/min with a grade 1/6 systolic ejection murmur Pulmonary:     Effort: Pulmonary effort is normal. No respiratory distress.     Breath sounds: Normal breath sounds. No wheezing or rales.     Comments: No axillary adenopathy Chest:     Chest [place]: No tenderness.  Abdominal:     General: Abdomen is flat. Bowel sounds are normal.     Palpations: Abdomen is soft. There is no mass.     Tenderness: There is no abdominal tenderness. There is no guarding.     Comments: Abdominal obesity without masses tenderness organ enlargement or bruits.  Musculoskeletal:        General: No tenderness.     Comments: Patient has limited range of motion of both shoulders.  Lymphadenopathy:     Cervical: No cervical adenopathy.  Skin:    General: Skin is warm and dry.     Coloration: Skin is not pale.     Findings: Bruising and erythema present. No rash.     Comments: Area of cellulitis right nostril where it joins the upper lip  Neurological:     Mental Status: He is alert and oriented to person, place, and time. Mental status is at baseline.     Cranial Nerves: No cranial nerve deficit.     Sensory: Sensory deficit present.     Deep Tendon Reflexes: Reflexes are normal and symmetric.     Comments: Diminished sensation to the plantar surface of the right great toe.  Psychiatric:        Mood and Affect: Mood normal.        Behavior: Behavior normal.        Thought Content: Thought content normal.        Judgment: Judgment normal.     Comments: Mood affect and behavior are all normal for this patient.    BP Marland Kitchen)  106/56 (BP Location: Left Arm)   Pulse 64   Temp (!) 97.4 F (36.3 C)  (Oral)   Ht 5' 10"  (1.778 m)   Wt 252 lb (114.3 kg)   BMI 36.16 kg/m         Assessment & Plan:  1. Type 1 diabetes mellitus with hypoglycemia, with long-term current use of insulin (HCC) -Continue control with diet and exercise and weight loss.  Continue current treatment  2. Pure hypercholesterolemia -All cholesterol numbers were good and patient will continue with current treatment  3. Essential hypertension -Blood pressure is good and patient will continue with current treatment  4. Vitamin D deficiency -Vitamin D level was good he will continue with current treatment  5. Thrombocytopenia ([place]) -Patient does have some abdominal bruising but platelet count was normal.  6. Testosterone deficiency -No complaints today and patient will continue with current treatment  7. Benign prostatic hyperplasia, unspecified whether lower urinary tract symptoms present -No complaints with voiding today.  8. Morbid obesity ([place]) -Patient has morbid obesity based on BMI greater than 35 and 2 or more comorbid health conditions.  He will continue to work aggressively on weight loss through diet and exercise  9. Aortic atherosclerosis ([place]) -Continue with cholesterol treatment  10. Diabetic peripheral neuropathy associated with type 2 diabetes mellitus (HCC) -Check feet regularly  11. Cellulitis of nasal tip -Take Keflex 503 times a day for 10 days and use mupirocin ointment and use some type of padding on the nasal apparatus for CPAP  Meds ordered this encounter  Medications  . cephALEXin (KEFLEX) 500 MG capsule    Sig: Take 1 capsule (500 mg total) by mouth 3 (three) times daily.    Dispense:  21 capsule    Refill:  0  . mupirocin ointment (BACTROBAN) 2 %    Sig: Apply 1 application topically 2 (two) times daily.    Dispense:  22 g    Refill:  0   Patient Instructions                       Medicare Annual Wellness Visit  [facility] and the medical providers at [facility] strive to bring you the best medical care.  In doing so we not only want to address your current medical conditions and concerns but also to detect new conditions early and prevent illness, disease and health-related problems.    Medicare offers a yearly Wellness Visit which allows our clinical staff to assess your need for preventative services including immunizations, lifestyle education, counseling to decrease risk of preventable diseases and screening for fall risk and other medical concerns.    This visit is provided free of charge (no copay) for all Medicare recipients. The clinical pharmacists at [facility] have begun to conduct these Wellness Visits which will also include a thorough review of all your medications.    As you primary medical provider recommend that you make an appointment for your Annual Wellness Visit if you have not done so already this year.  You may set up this appointment before you leave today or you may call back ([phone]) and schedule an appointment.  Please make sure when you call that you mention that you are scheduling your Annual Wellness Visit with the clinical pharmacist so that the appointment may be made for the proper length of time.     Continue current medications. Continue good therapeutic lifestyle changes which include good diet  and exercise. Fall precautions discussed with patient. If an FOBT was given today- please return it to our front desk. If you are over 11 years old - you may need Prevnar 39 or the adult Pneumonia vaccine.  **Flu shots are available--- please call and schedule a FLU-CLINIC appointment**  After your visit with [place] today you will receive a survey in the mail or online from [employer] regarding your care with [place]. Please take a moment to fill this out. Your feedback is very important to [place] as you can help [place] better understand your patient needs as well as improve your experience and  satisfaction. WE CARE ABOUT YOU!!!   Use mupirocin ointment and take antibiotic as directed for at least a week due to cellulitis of the nostril on the right side. Continue to follow-up with pulmonology and cardiology Continue to follow-up with orthopedics as planned  Arrie Senate MD

## 2018-09-26 ENCOUNTER — Other Ambulatory Visit: Payer: BLUE CROSS/BLUE SHIELD

## 2018-09-26 DIAGNOSIS — Z1211 Encounter for screening for malignant neoplasm of colon: Secondary | ICD-10-CM | POA: Diagnosis not present

## 2018-09-27 LAB — FECAL OCCULT BLOOD, IMMUNOCHEMICAL: FECAL OCCULT BLD: NEGATIVE

## 2018-09-28 ENCOUNTER — Encounter: Payer: Self-pay | Admitting: Family Medicine

## 2018-09-28 NOTE — Telephone Encounter (Signed)
Pt will contact Dr Percival Spanish to get cardiac clearance Pt will then contact Dr Laurance Flatten

## 2018-10-01 ENCOUNTER — Telehealth: Payer: Self-pay

## 2018-10-01 DIAGNOSIS — M19211 Secondary osteoarthritis, right shoulder: Secondary | ICD-10-CM | POA: Diagnosis not present

## 2018-10-01 NOTE — Telephone Encounter (Signed)
   [facility] Pre-operative Risk Assessment    Request for surgical clearance:  1. What type of surgery is being performed? 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 When is this surgery scheduled? TBD   3. What type of clearance is required (medical clearance vs. Pharmacy clearance to hold med vs. Both)? Medical  4. Are there any medications that need to be held prior to surgery and how long? none  5. Practice name and name of physician performing surgery? [facility]   6. What is your office phone number [phone]    7.   What is your office fax number [phone]  8.   Anesthesia type (None, local, MAC, general) ?  Choice   Tony Cantu 10/01/2018, 4:13 PM  _________________________________________________________________   (provider comments below)

## 2018-10-03 ENCOUNTER — Other Ambulatory Visit: Payer: Self-pay | Admitting: Orthopedic Surgery

## 2018-10-03 DIAGNOSIS — M25511 Pain in right shoulder: Secondary | ICD-10-CM

## 2018-10-04 NOTE — Telephone Encounter (Signed)
Pt has been scheduled to see Jory Sims, NP, 10/10/2018 @ 11:00 with recommendations to arrive 15 mins early for registration

## 2018-10-04 NOTE — Telephone Encounter (Signed)
   Primary Cardiologist:James Hochrein, MD  Chart reviewed as part of pre-operative protocol coverage. Because of Tony Cantu past medical history and time since last visit, he/she will require a follow-up visit in order to better assess preoperative cardiovascular risk.  Pre-op covering staff: - Please schedule appointment and call patient to inform them. - Please contact requesting surgeon's office via preferred method (i.e, phone, fax) to inform them of need for appointment prior to surgery.  If applicable, this message will also be routed to pharmacy pool and/or primary cardiologist for input on holding anticoagulant/antiplatelet agent as requested below so that this information is available at time of patient's appointment.   Cecilie Kicks, NP  10/04/2018, 3:49 PM

## 2018-10-09 ENCOUNTER — Encounter: Payer: Self-pay | Admitting: Family Medicine

## 2018-10-09 ENCOUNTER — Other Ambulatory Visit: Payer: Self-pay | Admitting: *Deleted

## 2018-10-09 MED ORDER — INSULIN GLARGINE (2 UNIT DIAL) 300 UNIT/ML ~~LOC~~ SOPN: 120.0000 [IU] | PEN_INJECTOR | Freq: Every day | SUBCUTANEOUS | 0 refills | Status: DC

## 2018-10-09 MED ORDER — AZITHROMYCIN 250 MG PO TABS: ORAL_TABLET | ORAL | 0 refills | Status: DC

## 2018-10-09 NOTE — Progress Notes (Signed)
Cardiology Office Note   Date:  10/10/2018   ID:  Tony Cantu, DOB [birth year], MRN [id number]  PCP:  Chipper Herb, MD  Cardiologist:  Dr. Percival Spanish  Chief Complaint  Patient presents with  . Follow-up    seen for Dr. Percival Spanish     History of Present Illness: Tony Cantu is a 68 y.o. male who presents for ongoing assessment and management of atrial fib, which appeared to be an isolated event, and was not on anticoagulation. Other history includes OSA on CPAP, IDDM, obesity and sepsis.  He had a Myoview in 2009 which was a normal study.  Patient was admitted in January 2018 with sepsis.  Echocardiogram at the time was normal with mild aortic stenosis.  He had a new onset of atrial fibrillation with RVR in the setting of sepsis.  When he was followed up in January 2018 with Dr. Percival Spanish, Dr. Percival Spanish could not locate any EKG strips or rhythm strip to demonstrate any evidence of atrial fibrillation.  Given the fact there was no concrete prove he truly had atrial fibrillation and also the fact that he does not have any recurrence, he was taken off of anticoagulation therapy.  There was some suspicion that the fact he had multiform PACs in the hospital may have misinterpreted as atrial fibrillation.  He was last seen in the clinic on 01/10/2018, at which time he was doing well.  He is here today for cardiology pre-operative evaluation prior to having  right shoulder revision to reverse shoulder arthoplasty on date TBD, by Dr.Chandler, [place].  He denies any recent exertional chest discomfort or shortness of breath.  EKG continued to show sinus rhythm with chronic right bundle branch block which is unchanged when compared to the previous EKG for the past 2 years.  He is able to complete at least 4 METS of activity without any issue.  He is cleared to proceed with upcoming surgery.  At his request, a copy of the preop clearance will be faxed to both the surgeons office and that his PCPs  office.  On physical exam, he continued to have a very mild murmur near the aortic valve area, however this does not appear to have been worsened.  I will hold off on repeating echocardiogram at this time.   Past Medical History:  Diagnosis Date  . Arthritis   . Asthma   . BPH (benign prostatic hypertrophy)   . Colon polyps   . Depression    no meds  . Diabetes mellitus without complication ([place])   . Gastritis   . GERD (gastroesophageal reflux disease)   . Headache    HX  . Hypertension   . PONV (postoperative nausea and vomiting)   . Seizures ([place])    HYPOGLYCEMIC  . Sleep apnea    uses CPAP nightly  . Testicle trouble    one testicle    Past Surgical History:  Procedure Laterality Date  . BACK SURGERY     94  . CARPAL TUNNEL RELEASE Right 10/13/2015   Procedure: RIGHT CARPAL TUNNEL RELEASE;  Surgeon: Daryll Brod, MD;  Location: [facility];  Service: Orthopedics;  Laterality: Right;  . CARPAL TUNNEL RELEASE Left 07/05/2016   Procedure: LEFT CARPAL TUNNEL RELEASE;  Surgeon: Daryll Brod, MD;  Location: [facility];  Service: Orthopedics;  Laterality: Left;  . HERNIA REPAIR    . INGUINAL HERNIA REPAIR  2003   right   . KNEE ARTHROSCOPY Right 03/03/2016  .  LUMBAR DISC SURGERY  8/96   Dr. Coralyn Mark, discectomy  . LUMBAR LAMINECTOMY/DECOMPRESSION MICRODISCECTOMY Right 10/06/2016   Procedure: RIGHT LUMBAR THREE - LUMBAR FOUR  LAMINECTOMY, FORAMINOTOMY AND MICRODISCECTOMY;  Surgeon: Jovita Gamma, MD;  Location: [facility];  Service: Neurosurgery;  Laterality: Right;  . SHOULDER SURGERY  11/28/05   left partial  . SHOULDER SURGERY  07/14/2006   RIGHT  . TONSILECTOMY, ADENOIDECTOMY, BILATERAL MYRINGOTOMY AND TUBES     NO TUBES      Current Outpatient Medications  Medication Sig Dispense Refill  . albuterol (PROVENTIL HFA;VENTOLIN HFA) 108 (90 Base) MCG/ACT inhaler Inhale 2 puffs into the lungs every 6 (six) hours as needed for wheezing or shortness of  breath. 3 Inhaler 1  . aspirin EC 81 MG tablet Take 81 mg by mouth daily with breakfast.     . atorvastatin (LIPITOR) 40 MG tablet TAKE 1 TABLET BY MOUTH  DAILY 90 tablet 1  . azithromycin (ZITHROMAX) 250 MG tablet As directed 6 tablet 0  . b complex vitamins tablet Take 1 tablet by mouth daily at 12 noon.     . Calcium Carbonate (CALTRATE 600 PO) Take 1 tablet by mouth daily at 12 noon.     . Cholecalciferol (VITAMIN D3) 2000 UNITS capsule Take 2,000 Units by mouth daily at 12 noon.     . Continuous Blood Gluc Receiver (FREESTYLE LIBRE READER) DEVI 1 applicator by Does not apply route as directed. 1 Device 1  . Continuous Blood Gluc Sensor (FREESTYLE LIBRE SENSOR SYSTEM) MISC Check BS eight (8) times a day. Dx E10.9 3 each 3  . fexofenadine (ALLEGRA) 180 MG tablet Take 1 tablet (180 mg total) by mouth daily with breakfast. 90 tablet 3  . Flaxseed, Linseed, (FLAX SEED OIL PO) Take 1 capsule by mouth daily with breakfast.     . furosemide (LASIX) 20 MG tablet TAKE 1 AND 1/2 TABLETS BY  MOUTH DAILY 135 tablet 1  . GLUCAGON EMERGENCY 1 MG injection INJECT 1MG INTO SKIN ONCE  AS NEEDED 3 each 1  . Glucosamine-Chondroit-Vit C-Mn (GLUCOSAMINE 1500 COMPLEX) CAPS Take 2 capsules by mouth daily at 12 noon.     Marland Kitchen HUMALOG 100 UNIT/ML injection INJECT 20 TO 50 UNITS  SUBCUTANEOUSLY 3 TIMES  DAILY BEFORE MEALS PER SLIDING SCALE 140 mL 0  . Insulin Glargine, 2 Unit Dial, (TOUJEO MAX SOLOSTAR) 300 UNIT/ML SOPN Inject 120 Units into the skin daily. 30 mL 0  . montelukast (SINGULAIR) 10 MG tablet TAKE 1 TABLET BY MOUTH AT  BEDTIME 90 tablet 1  . multivitamin-lutein (OCUVITE-LUTEIN) CAPS capsule Take 1 capsule by mouth daily at 12 noon.    . mupirocin ointment (BACTROBAN) 2 % Apply 1 application topically 2 (two) times daily. 22 g 0  . pantoprazole (PROTONIX) 40 MG tablet TAKE 1 TABLET BY MOUTH  DAILY 90 tablet 1  . sildenafil (REVATIO) 20 MG tablet TAKE 2-5 TABLETS AS NEEDED PRIOR TO SEXUAL ACTIVITY 50 tablet 1    . Testosterone 30 MG/ACT SOLN USE 1 PUMP UNDER EACH  AXILLA DAILY (TOTAL OF 60MG DAILY) 270 mL 0  . triamcinolone acetonide 40 MG/ML SUSP 40 mg, mupirocin cream 2 % CREA 15 application Apply 1 application topically 2 (two) times daily. 30 g 1  . valsartan (DIOVAN) 160 MG tablet TAKE 1 TABLET BY MOUTH  DAILY 90 tablet 1   No current facility-administered medications for this visit.     Allergies:   Erythromycin and Sulfonamide derivatives    Social History:  The patient  reports that he quit smoking about 17 years ago. His smoking use included cigarettes and pipe. He quit after 34.00 years of use. He has never used smokeless tobacco. He reports that he does not drink alcohol or use drugs.   Family History:  The patient's family history includes Allergic rhinitis in his sister; Alzheimer's disease in his mother; Asthma in his father; Breast cancer in his maternal grandmother; Diabetes in his mother; Heart attack in his maternal grandmother and mother; Heart failure in his maternal grandmother and mother; Rheum arthritis in his maternal grandmother; Suicidality in his father.    ROS: All other systems are reviewed and negative. Unless otherwise mentioned in H&P    PHYSICAL EXAM: VS:  BP 110/64   Pulse 73   Ht 5' 10"  (1.778 m)   Wt 252 lb 9.6 oz (114.6 kg)   BMI 36.24 kg/m  , BMI Body mass index is 36.24 kg/m. GEN: Well nourished, well developed, in no acute distress HEENT: normal Neck: no JVD, carotid bruits, or masses Cardiac: RRR; no rubs, or gallops,no edema  1/6 systolic murmur at RUSB Respiratory:  Clear to auscultation bilaterally, normal work of breathing GI: soft, nontender, nondistended, + BS MS: no deformity or atrophy Skin: warm and dry, no rash Neuro:  Strength and sensation are intact Psych: euthymic mood, full affect   EKG:  EKG is ordered today. The ekg ordered today demonstrates normal sinus rhythm with right bundle branch block   Recent Labs: 12/18/2017: TSH  1.670 09/11/2018: ALT 43; BUN 14; Creatinine, Ser 0.92; Hemoglobin 15.7; Platelets 156; Potassium 4.0; Sodium 138    Lipid Panel    Component Value Date/Time   CHOL 103 09/11/2018 1114   CHOL 107 01/07/2013 0840   TRIG 54 09/11/2018 1114   TRIG 59 03/14/2017 0802   TRIG 47 01/07/2013 0840   HDL 49 09/11/2018 1114   HDL 49 03/14/2017 0802   HDL 53 01/07/2013 0840   CHOLHDL 2.1 09/11/2018 1114   LDLCALC 43 09/11/2018 1114   LDLCALC 44 01/03/2014 0803   LDLCALC 45 01/07/2013 0840      Wt Readings from Last 3 Encounters:  10/10/18 252 lb 9.6 oz (114.6 kg)  09/19/18 252 lb (114.3 kg)  05/04/18 252 lb (114.3 kg)      Other studies Reviewed: Additional studies/ records that were reviewed today include:   Echo 07/2016 LV EF: 60% -   65% Study Conclusions  - Left ventricle: The cavity size was normal. Schuermann thickness was   normal. Systolic function was normal. The estimated ejection   fraction was in the range of 60% to 65%. Adriance motion was normal;   there were no regional Forsman motion abnormalities. Left   ventricular diastolic function parameters were normal. - Aortic valve: Moderately calcified annulus. Trileaflet;   moderately thickened leaflets. There was very mild stenosis.   Valve area (VTI): 2.15 cm^2. Valve area (Vmax): 1.97 cm^2. - Mitral valve: Mildly calcified annulus. Mildly thickened leaflets   . - Pulmonary arteries: Systolic pressure was mildly increased. PA   peak pressure: 33 mm Hg (S). - Technically difficult study.   Review of the above records demonstrates: Mild aortic stenosis   ASSESSMENT AND PLAN:  1.  Preoperative clearance: Patient is able to complete at least 4 METS of activity, no further work-up is needed prior to the upcoming shoulder surgery.  Given lack of history of CAD, he is a low risk patient for the intended procedure.  2.  Aortic stenosis: Mild on previous echocardiogram in December 2017.  Continue to have mild heart murmur on  physical exam, denies any exertional symptoms to suggest worsening valvular issue.  Will defer to Dr. Percival Spanish to decide when to repeat echocardiogram.  3.  Hyperlipidemia: Continue Lipitor 40 mg daily.  Recent fasting lipid panel showed very well-controlled cholesterol  4. Questionable h/o Afib: Although patient reportedly had A. fib in January 2018 during the hospitalization, however no strips or EKG demonstrated A. fib.  It was suggested this is a isolated event and has not recurred since.  Therefore patient is not on any systemic anticoagulation therapy  5. OSA: on CPAP  Current medicines are reviewed at length with the patient today.    Labs/ tests ordered today include: None Almyra Deforest PA-C    10/10/2018 11:36 AM    [facility] [address] Office [phone] Fax [phone]

## 2018-10-10 ENCOUNTER — Ambulatory Visit: Payer: BLUE CROSS/BLUE SHIELD | Admitting: Physician Assistant

## 2018-10-10 ENCOUNTER — Ambulatory Visit
Admission: RE | Admit: 2018-10-10 | Discharge: 2018-10-10 | Disposition: A | Discharge status: Home / Self Care | Payer: BLUE CROSS/BLUE SHIELD | Source: Ambulatory Visit | Attending: Orthopedic Surgery | Admitting: Orthopedic Surgery

## 2018-10-10 ENCOUNTER — Encounter: Payer: Self-pay | Admitting: Physician Assistant

## 2018-10-10 VITALS — BP 110/64 | HR 73 | Ht 70.0 in | Wt 252.6 lb

## 2018-10-10 DIAGNOSIS — E119 Type 2 diabetes mellitus without complications: Secondary | ICD-10-CM

## 2018-10-10 DIAGNOSIS — I480 Paroxysmal atrial fibrillation: Secondary | ICD-10-CM | POA: Diagnosis not present

## 2018-10-10 DIAGNOSIS — I4510 Unspecified right bundle-branch block: Secondary | ICD-10-CM

## 2018-10-10 DIAGNOSIS — Z794 Long term (current) use of insulin: Secondary | ICD-10-CM

## 2018-10-10 DIAGNOSIS — G4733 Obstructive sleep apnea (adult) (pediatric): Secondary | ICD-10-CM

## 2018-10-10 DIAGNOSIS — M19011 Primary osteoarthritis, right shoulder: Secondary | ICD-10-CM | POA: Diagnosis not present

## 2018-10-10 DIAGNOSIS — I350 Nonrheumatic aortic (valve) stenosis: Secondary | ICD-10-CM

## 2018-10-10 DIAGNOSIS — Z9989 Dependence on other enabling machines and devices: Secondary | ICD-10-CM

## 2018-10-10 DIAGNOSIS — E785 Hyperlipidemia, unspecified: Secondary | ICD-10-CM

## 2018-10-10 DIAGNOSIS — Z01810 Encounter for preprocedural cardiovascular examination: Secondary | ICD-10-CM

## 2018-10-10 DIAGNOSIS — M25511 Pain in right shoulder: Secondary | ICD-10-CM

## 2018-10-10 NOTE — Telephone Encounter (Signed)
Note routed to both Dr Laurance Flatten and Dr Tamera Punt via [place].

## 2018-10-10 NOTE — Telephone Encounter (Signed)
   Primary Cardiologist: Minus Breeding, MD  Chart reviewed as part of pre-operative protocol coverage. Given past medical history and time since last visit, based on ACC/AHA guidelines, Tony Cantu would be at acceptable risk for the planned procedure without further cardiovascular testing.   I will route this recommendation to the requesting party via Epic fax function and remove from pre-op pool.  Please call with questions. I have seen the patient in the cardiology office today, he is able to complete 4 METS of activity without any exertional chest pain or shortness of breath.   At the patient's request, this preoperative clearance will be fax to both the surgeon's office and Dr. Tawanna Sat office [phone])  Almyra Deforest, [place] 10/10/2018, 11:14 AM

## 2018-10-10 NOTE — Patient Instructions (Signed)
Medication Instructions:  Almyra Deforest, PA recommends that you continue on your current medications as directed. Please refer to the Current Medication list given to you today.  If you need a refill on your cardiac medications before your next appointment, please call your pharmacy.   Follow-Up: Keep your previously scheduled appointment with Dr Percival Spanish on 01/16/19 in our [place] location.

## 2018-10-12 ENCOUNTER — Other Ambulatory Visit: Payer: Self-pay | Admitting: Family Medicine

## 2018-10-15 NOTE — Addendum Note (Signed)
Addended by: Diana Eves on: 10/15/2018 02:33 PM   Modules accepted: Orders

## 2018-10-16 ENCOUNTER — Other Ambulatory Visit: Payer: Self-pay | Admitting: Orthopedic Surgery

## 2018-10-18 IMAGING — DX DG KNEE 1-2V*R*
2 series · 2 of 2 positions shown · non-contrast
Comparison: None.

CLINICAL DATA: Syncopal episode 3 weeks ago with subsequent pain

EXAM:
RIGHT KNEE - 1-2 VIEW

[knee ap]
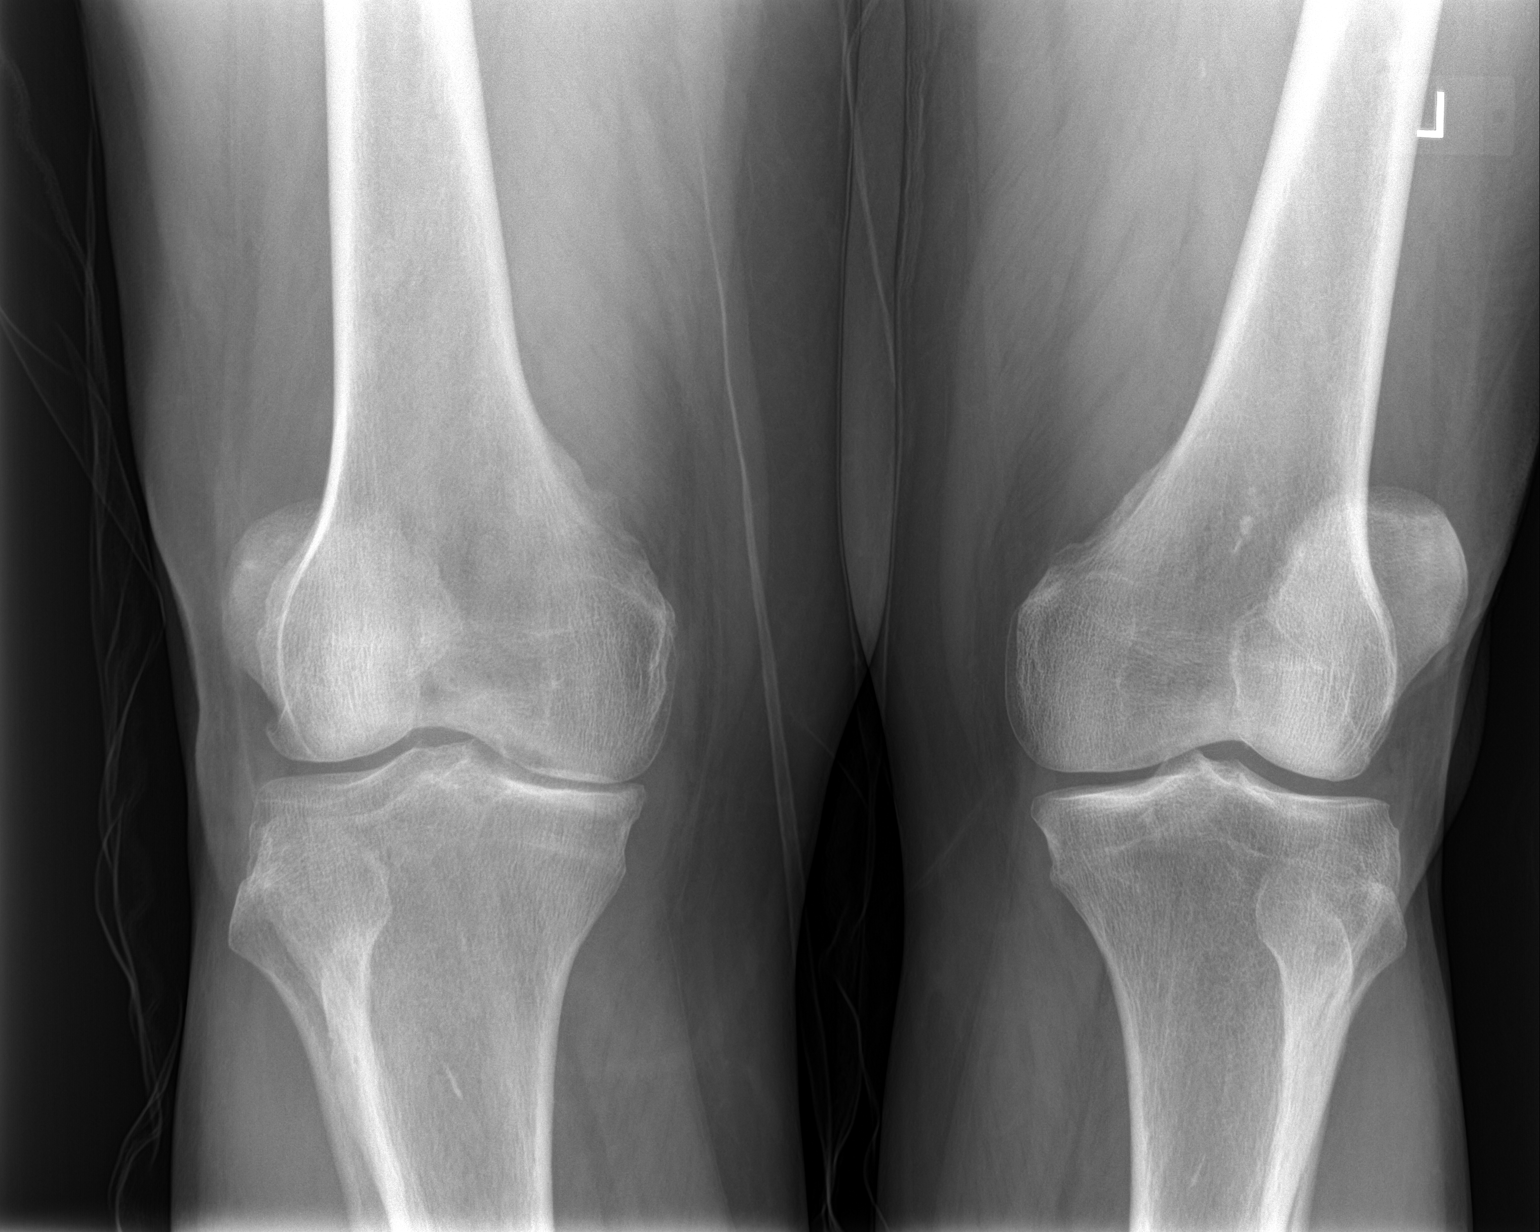

[knee lat]
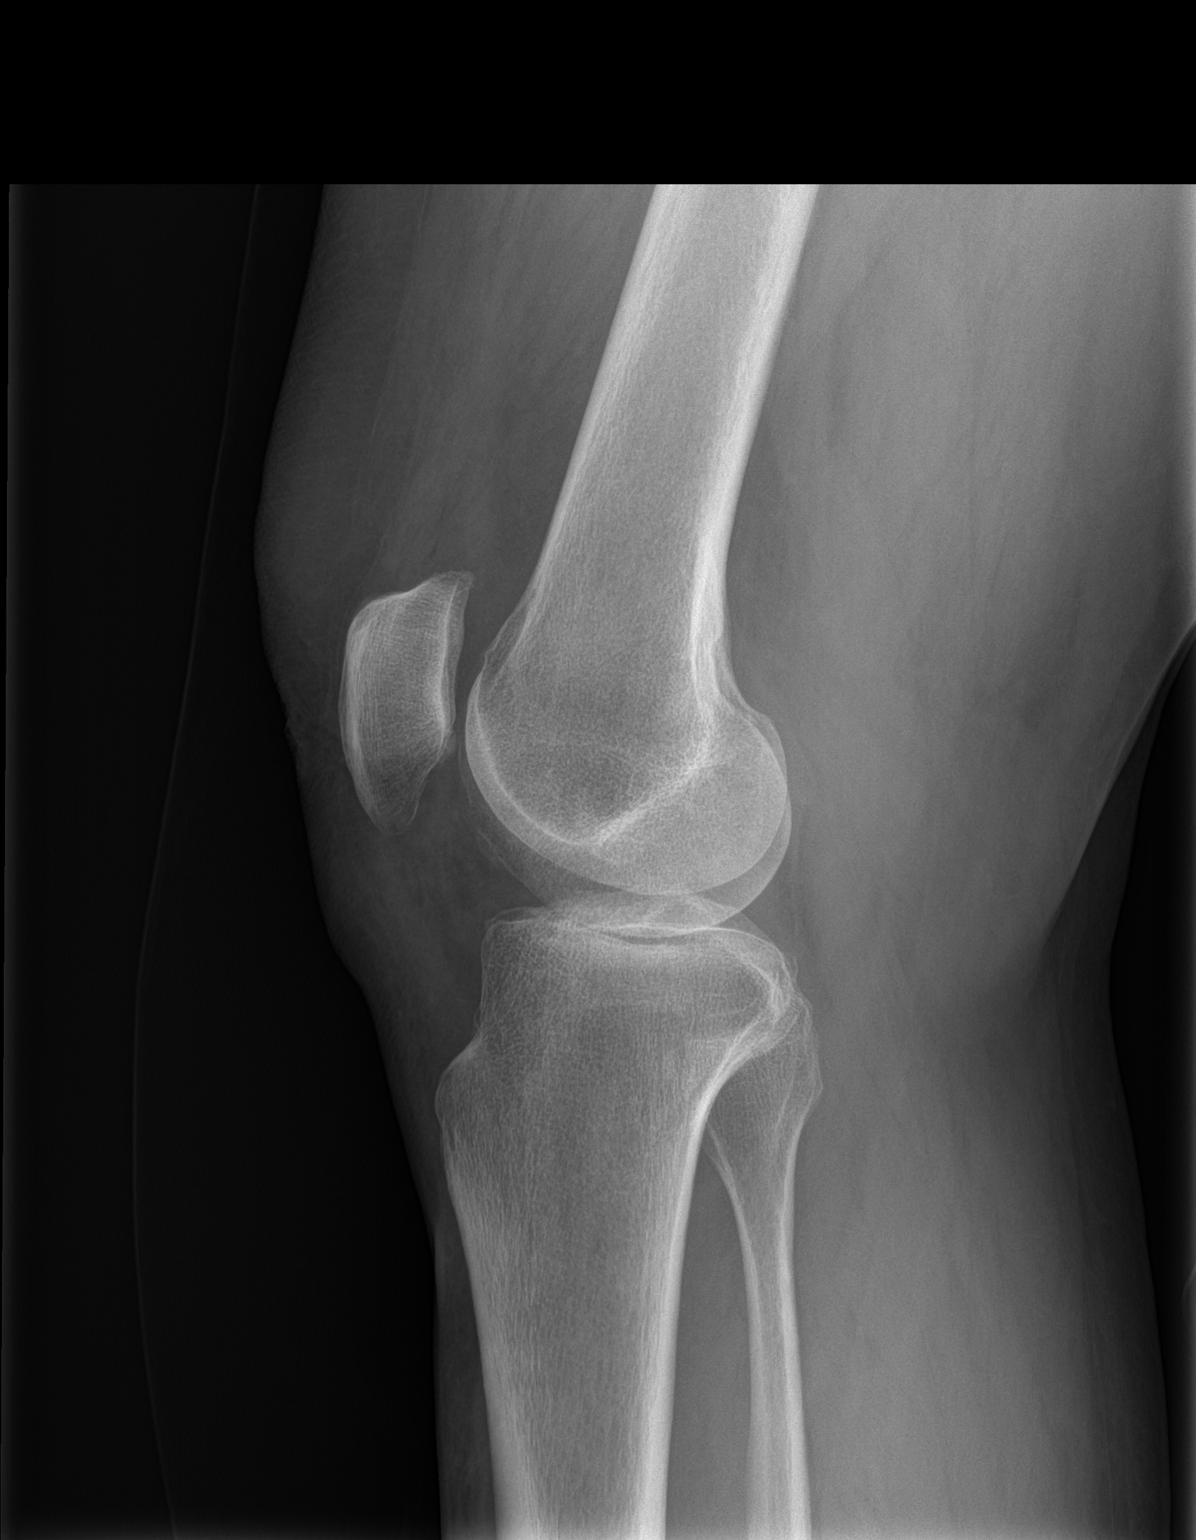

[2 of 2 positions shown; findings below may reference images not displayed]

FINDINGS: AP view includes both knees. AP left appears normal. AP right shows
medial joint space narrowing. Lateral right shows a small amount of
joint fluid. No visible fracture or loose body.
IMPRESSION: No acute finding by radiography. Medial compartment joint space
narrowing on the right with small right effusion.

## 2018-10-22 NOTE — Progress Notes (Signed)
CARDIAC CLEARANCE MENG HAO PA 10-10-18 Epic EKG 10-10-18 Epic ECHO 08-09-16 EPIC

## 2018-10-22 NOTE — Patient Instructions (Addendum)
Tony Cantu     Your procedure is scheduled on:  11-01-2018  Report to [facility] Main  Entrance  Report to admitting at 800 AM   BRING CPAP MASK AND TUBING   Call this number if you have problems the morning of surgery [phone]    Remember: Do not eat food or drink liquids :After Midnight. BRUSH YOUR TEETH MORNING OF SURGERY AND RINSE YOUR MOUTH OUT, NO CHEWING GUM CANDY OR MINTS.  How to Manage Your Diabetes Before and After Surgery  Why is it important to control my blood sugar before and after surgery? . Improving blood sugar levels before and after surgery helps healing and can limit problems. . A way of improving blood sugar control is eating a healthy diet by: o  Eating less sugar and carbohydrates o  Increasing activity/exercise o  Talking with your doctor about reaching your blood sugar goals . High blood sugars (greater than 180 mg/dL) can raise your risk of infections and slow your recovery, so you will need to focus on controlling your diabetes during the weeks before surgery. . Make sure that the doctor who takes care of your diabetes knows about your planned surgery including the date and location.  How do I manage my blood sugar before surgery? . Check your blood sugar at least 4 times a day, starting 2 days before surgery, to make sure that the level is not too high or low. o Check your blood sugar the morning of your surgery when you wake up and every 2 hours until you get to the Short Stay unit. . If your blood sugar is less than 70 mg/dL, you will need to treat for low blood sugar: o Do not take insulin. o Treat a low blood sugar (less than 70 mg/dL) with  cup of clear juice (cranberry or apple), 4 glucose tablets, OR glucose gel. o Recheck blood sugar in 15 minutes after treatment (to make sure it is greater than 70 mg/dL). If your blood sugar is not greater than 70 mg/dL on recheck, call [phone] for further  instructions. . Report your blood sugar to the short stay nurse when you get to Short Stay.  . If you are admitted to the hospital after surgery: o Your blood sugar will be checked by the staff and you will probably be given insulin after surgery (instead of oral diabetes medicines) to make sure you have good blood sugar levels. o The goal for blood sugar control after surgery is 80-180 mg/dL.   WHAT DO I DO ABOUT MY DIABETES MEDICATION?  .  TAKE YOUR SLIDING  SCALE INSULIN AS NORMAL DAY BEFORE SURGERY.  . THE NIGHT BEFORE SURGERY TAKE 80 % DOSE OF TOUJEO (TAKE 96 UNITS)     . IF YOUR BLOOD SUGAR IS GREATER THAN 220 AM OF SURGERY TAKE 1/2 DOSE OF SLIDING SCALE AM OF SURGERY      Reviewed and Endorsed by [place] Patient Education Committee, August 2015   Take these medicines the morning of surgery with A SIP OF WATER:  ALBUTEROL INHALER IF NEEDED AND BRING INALER, FEXOFENADINE (ALLEGRA), PANTAPRAZOLE (PROTONIX)              You may not have any metal on your body including hair pins and              piercings  Do not wear jewelry, make-up, lotions,  powders or perfumes, deodorant             Do not wear nail polish.  Do not shave  48 hours prior to surgery.              Men may shave face and neck.   Do not bring valuables to the hospital. [place].  Contacts, dentures or bridgework may not be worn into surgery.  Leave suitcase in the car. After surgery it may be brought to your room.     _____________________________________________________________________  [facility] - Preparing for Surgery Before surgery, you can play an important role.  Because skin is not sterile, your skin needs to be as free of germs as possible.  You can reduce the number of germs on your skin by washing with CHG (chlorahexidine gluconate) soap before surgery.  CHG is an antiseptic cleaner which kills germs and bonds with the skin to continue killing  germs even after washing. Please DO NOT use if you have an allergy to CHG or antibacterial soaps.  If your skin becomes reddened/irritated stop using the CHG and inform your nurse when you arrive at Short Stay. Do not shave (including legs and underarms) for at least 48 hours prior to the first CHG shower.  You may shave your face/neck. Please follow these instructions carefully:  1.  Shower with CHG Soap the night before surgery and the  morning of Surgery.  2.  If you choose to wash your hair, wash your hair first as usual with your  normal  shampoo.  3.  After you shampoo, rinse your hair and body thoroughly to remove the  shampoo.                           4.  Use CHG as you would any other liquid soap.  You can apply chg directly  to the skin and wash                       Gently with a scrungie or clean washcloth.  5.  Apply the CHG Soap to your body ONLY FROM THE NECK DOWN.   Do not use on face/ open                           Wound or open sores. Avoid contact with eyes, ears mouth and genitals (private parts).                       Wash face,  Genitals (private parts) with your normal soap.             6.  Wash thoroughly, paying special attention to the area where your surgery  will be performed.  7.  Thoroughly rinse your body with warm water from the neck down.  8.  DO NOT shower/wash with your normal soap after using and rinsing off  the CHG Soap.                9.  Pat yourself dry with a clean towel.            10.  Wear clean pajamas.            11.  Place clean sheets on your bed the night  of your first shower and do not  sleep with pets. Day of Surgery : Do not apply any lotions/deodorants the morning of surgery.  Please wear clean clothes to the hospital/surgery center.  FAILURE TO FOLLOW THESE INSTRUCTIONS MAY RESULT IN THE CANCELLATION OF YOUR SURGERY PATIENT SIGNATURE_________________________________  NURSE  SIGNATURE__________________________________  ________________________________________________________________________   Adam Phenix  An incentive spirometer is a tool that can help keep your lungs clear and active. This tool measures how well you are filling your lungs with each breath. Taking long deep breaths may help reverse or decrease the chance of developing breathing (pulmonary) problems (especially infection) following:  A long period of time when you are unable to move or be active. BEFORE THE PROCEDURE   If the spirometer includes an indicator to show your best effort, your nurse or respiratory therapist will set it to a desired goal.  If possible, sit up straight or lean slightly forward. Try not to slouch.  Hold the incentive spirometer in an upright position. INSTRUCTIONS FOR USE  1. Sit on the edge of your bed if possible, or sit up as far as you can in bed or on a chair. 2. Hold the incentive spirometer in an upright position. 3. Breathe out normally. 4. Place the mouthpiece in your mouth and seal your lips tightly around it. 5. Breathe in slowly and as deeply as possible, raising the piston or the ball toward the top of the column. 6. Hold your breath for 3-5 seconds or for as long as possible. Allow the piston or ball to fall to the bottom of the column. 7. Remove the mouthpiece from your mouth and breathe out normally. 8. Rest for a few seconds and repeat Steps 1 through 7 at least 10 times every 1-2 hours when you are awake. Take your time and take a few normal breaths between deep breaths. 9. The spirometer may include an indicator to show your best effort. Use the indicator as a goal to work toward during each repetition. 10. After each set of 10 deep breaths, practice coughing to be sure your lungs are clear. If you have an incision (the cut made at the time of surgery), support your incision when coughing by placing a pillow or rolled up towels firmly  against it. Once you are able to get out of bed, walk around indoors and cough well. You may stop using the incentive spirometer when instructed by your caregiver.  RISKS AND COMPLICATIONS  Take your time so you do not get dizzy or light-headed.  If you are in pain, you may need to take or ask for pain medication before doing incentive spirometry. It is harder to take a deep breath if you are having pain. AFTER USE  Rest and breathe slowly and easily.  It can be helpful to keep track of a log of your progress. Your caregiver can provide you with a simple table to help with this. If you are using the spirometer at home, follow these instructions: [place] IF:   You are having difficultly using the spirometer.  You have trouble using the spirometer as often as instructed.  Your pain medication is not giving enough relief while using the spirometer.  You develop fever of 100.5 F (38.1 C) or higher. SEEK IMMEDIATE MEDICAL CARE IF:   You cough up bloody sputum that had not been present before.  You develop fever of 102 F (38.9 C) or greater.  You develop worsening pain at or near  the incision site. MAKE SURE YOU:   Understand these instructions.  Will watch your condition.  Will get help right away if you are not doing well or get worse. Document Released: 12/26/2006 Document Revised: 11/07/2011 Document Reviewed: 02/26/2007 ExitCare Patient Information 2014 Exit[employer], [place].   ________________________________________________________________________  WHAT IS A BLOOD TRANSFUSION? Blood Transfusion Information  A transfusion is the replacement of blood or some of its parts. Blood is made up of multiple cells which provide different functions.  Red blood cells carry oxygen and are used for blood loss replacement.  White blood cells fight against infection.  Platelets control bleeding.  Plasma helps clot blood.  Other blood products are available for  specialized needs, such as hemophilia or other clotting disorders. BEFORE THE TRANSFUSION  Who gives blood for transfusions?   Healthy volunteers who are fully evaluated to make sure their blood is safe. This is blood bank blood. Transfusion therapy is the safest it has ever been in the practice of medicine. Before blood is taken from a donor, a complete history is taken to make sure that person has no history of diseases nor engages in risky social behavior (examples are intravenous drug use or sexual activity with multiple partners). The donor's travel history is screened to minimize risk of transmitting infections, such as malaria. The donated blood is tested for signs of infectious diseases, such as HIV and hepatitis. The blood is then tested to be sure it is compatible with you in order to minimize the chance of a transfusion reaction. If you or a relative donates blood, this is often done in anticipation of surgery and is not appropriate for emergency situations. It takes many days to process the donated blood. RISKS AND COMPLICATIONS Although transfusion therapy is very safe and saves many lives, the main dangers of transfusion include:   Getting an infectious disease.  Developing a transfusion reaction. This is an allergic reaction to something in the blood you were given. Every precaution is taken to prevent this. The decision to have a blood transfusion has been considered carefully by your caregiver before blood is given. Blood is not given unless the benefits outweigh the risks. AFTER THE TRANSFUSION  Right after receiving a blood transfusion, you will usually feel much better and more energetic. This is especially true if your red blood cells have gotten low (anemic). The transfusion raises the level of the red blood cells which carry oxygen, and this usually causes an energy increase.  The nurse administering the transfusion will monitor you carefully for complications. HOME CARE  INSTRUCTIONS  No special instructions are needed after a transfusion. You may find your energy is better. Speak with your caregiver about any limitations on activity for underlying diseases you may have. SEEK MEDICAL CARE IF:   Your condition is not improving after your transfusion.  You develop redness or irritation at the intravenous (IV) site. SEEK IMMEDIATE MEDICAL CARE IF:  Any of the following symptoms occur over the next 12 hours:  Shaking chills.  You have a temperature by mouth above 102 F (38.9 C), not controlled by medicine.  Chest, back, or muscle pain.  People around you feel you are not acting correctly or are confused.  Shortness of breath or difficulty breathing.  Dizziness and fainting.  You get a rash or develop hives.  You have a decrease in urine output.  Your urine turns a dark color or changes to pink, red, or brown. Any of the following symptoms occur over the  next 10 days:  You have a temperature by mouth above 102 F (38.9 C), not controlled by medicine.  Shortness of breath.  Weakness after normal activity.  The white part of the eye turns yellow (jaundice).  You have a decrease in the amount of urine or are urinating less often.  Your urine turns a dark color or changes to pink, red, or brown. Document Released: 08/12/2000 Document Revised: 11/07/2011 Document Reviewed: 03/31/2008 [facility] Patient Information 2014 [place], [place].  _______________________________________________________________________

## 2018-10-25 ENCOUNTER — Encounter (HOSPITAL_COMMUNITY): Payer: Self-pay

## 2018-10-25 ENCOUNTER — Other Ambulatory Visit: Payer: Self-pay

## 2018-10-25 ENCOUNTER — Ambulatory Visit (HOSPITAL_COMMUNITY)
Admission: RE | Admit: 2018-10-25 | Discharge: 2018-10-25 | Disposition: A | Discharge status: Home / Self Care | Payer: BLUE CROSS/BLUE SHIELD | Source: Ambulatory Visit | Attending: Orthopedic Surgery | Admitting: Orthopedic Surgery

## 2018-10-25 ENCOUNTER — Encounter (HOSPITAL_COMMUNITY)
Admission: RE | Admit: 2018-10-25 | Discharge: 2018-10-25 | Disposition: A | Discharge status: Home / Self Care | Payer: BLUE CROSS/BLUE SHIELD | Source: Ambulatory Visit | Attending: Orthopedic Surgery | Admitting: Orthopedic Surgery

## 2018-10-25 DIAGNOSIS — Z79890 Hormone replacement therapy (postmenopausal): Secondary | ICD-10-CM | POA: Insufficient documentation

## 2018-10-25 DIAGNOSIS — I10 Essential (primary) hypertension: Secondary | ICD-10-CM | POA: Insufficient documentation

## 2018-10-25 DIAGNOSIS — Z96611 Presence of right artificial shoulder joint: Secondary | ICD-10-CM | POA: Diagnosis not present

## 2018-10-25 DIAGNOSIS — Z01811 Encounter for preprocedural respiratory examination: Secondary | ICD-10-CM | POA: Diagnosis not present

## 2018-10-25 DIAGNOSIS — Z794 Long term (current) use of insulin: Secondary | ICD-10-CM | POA: Insufficient documentation

## 2018-10-25 DIAGNOSIS — Z7982 Long term (current) use of aspirin: Secondary | ICD-10-CM | POA: Diagnosis not present

## 2018-10-25 DIAGNOSIS — N400 Enlarged prostate without lower urinary tract symptoms: Secondary | ICD-10-CM | POA: Insufficient documentation

## 2018-10-25 DIAGNOSIS — J45909 Unspecified asthma, uncomplicated: Secondary | ICD-10-CM | POA: Insufficient documentation

## 2018-10-25 DIAGNOSIS — Z79899 Other long term (current) drug therapy: Secondary | ICD-10-CM | POA: Diagnosis not present

## 2018-10-25 DIAGNOSIS — T8484XA Pain due to internal orthopedic prosthetic devices, implants and grafts, initial encounter: Secondary | ICD-10-CM | POA: Insufficient documentation

## 2018-10-25 DIAGNOSIS — E109 Type 1 diabetes mellitus without complications: Secondary | ICD-10-CM | POA: Insufficient documentation

## 2018-10-25 DIAGNOSIS — Y838 Other surgical procedures as the cause of abnormal reaction of the patient, or of later complication, without mention of misadventure at the time of the procedure: Secondary | ICD-10-CM | POA: Diagnosis not present

## 2018-10-25 DIAGNOSIS — K219 Gastro-esophageal reflux disease without esophagitis: Secondary | ICD-10-CM | POA: Diagnosis not present

## 2018-10-25 DIAGNOSIS — Z01818 Encounter for other preprocedural examination: Secondary | ICD-10-CM | POA: Diagnosis not present

## 2018-10-25 LAB — URINALYSIS, ROUTINE W REFLEX MICROSCOPIC
Bilirubin Urine: NEGATIVE
GLUCOSE, UA: NEGATIVE mg/dL
Hgb urine dipstick: NEGATIVE
Ketones, ur: NEGATIVE mg/dL
Leukocytes,Ua: NEGATIVE
Nitrite: NEGATIVE
PH: 7 (ref 5.0–8.0)
Protein, ur: NEGATIVE mg/dL
Specific Gravity, Urine: 1.008 (ref 1.005–1.030)

## 2018-10-25 LAB — CBC WITH DIFFERENTIAL/PLATELET
Abs Immature Granulocytes: 0.04 10*3/uL (ref 0.00–0.07)
Basophils Absolute: 0.1 10*3/uL (ref 0.0–0.1)
Basophils Relative: 1 %
EOS ABS: 0.2 10*3/uL (ref 0.0–0.5)
Eosinophils Relative: 2 %
HCT: 51.4 % (ref 39.0–52.0)
Hemoglobin: 16.3 g/dL (ref 13.0–17.0)
Immature Granulocytes: 1 %
Lymphocytes Relative: 25 %
Lymphs Abs: 1.8 10*3/uL (ref 0.7–4.0)
MCH: 32.5 pg (ref 26.0–34.0)
MCHC: 31.7 g/dL (ref 30.0–36.0)
MCV: 102.6 fL — ABNORMAL HIGH (ref 80.0–100.0)
Monocytes Absolute: 1.2 10*3/uL — ABNORMAL HIGH (ref 0.1–1.0)
Monocytes Relative: 18 %
Neutro Abs: 3.7 10*3/uL (ref 1.7–7.7)
Neutrophils Relative %: 53 %
Platelets: 153 10*3/uL (ref 150–400)
RBC: 5.01 MIL/uL (ref 4.22–5.81)
RDW: 15.1 % (ref 11.5–15.5)
WBC: 7 10*3/uL (ref 4.0–10.5)
nRBC: 0 % (ref 0.0–0.2)

## 2018-10-25 LAB — COMPREHENSIVE METABOLIC PANEL
ALT: 50 U/L — ABNORMAL HIGH (ref 0–44)
AST: 47 U/L — ABNORMAL HIGH (ref 15–41)
Albumin: 3.8 g/dL (ref 3.5–5.0)
Alkaline Phosphatase: 58 U/L (ref 38–126)
Anion gap: 10 (ref 5–15)
BUN: 15 mg/dL (ref 8–23)
CO2: 25 mmol/L (ref 22–32)
Calcium: 9.3 mg/dL (ref 8.9–10.3)
Chloride: 104 mmol/L (ref 98–111)
Creatinine, Ser: 0.85 mg/dL (ref 0.61–1.24)
GFR calc Af Amer: >60 mL/min (ref >60)
GFR calc non Af Amer: >60 mL/min (ref >60)
Glucose, Bld: 83 mg/dL (ref 70–99)
Potassium: 3.9 mmol/L (ref 3.5–5.1)
Sodium: 139 mmol/L (ref 135–145)
Total Bilirubin: 1 mg/dL (ref 0.3–1.2)
Total Protein: 7.6 g/dL (ref 6.5–8.1)

## 2018-10-25 LAB — GLUCOSE, CAPILLARY: Glucose-Capillary: 88 mg/dL (ref 70–99)

## 2018-10-25 LAB — PROTIME-INR
INR: 1 (ref 0.8–1.2)
PROTHROMBIN TIME: 12.8 s (ref 11.4–15.2)

## 2018-10-25 LAB — HEMOGLOBIN A1C
Hgb A1c MFr Bld: 5.6 % (ref 4.8–5.6)
Mean Plasma Glucose: 114.02 mg/dL

## 2018-10-25 LAB — SURGICAL PCR SCREEN
MRSA, PCR: NEGATIVE
Staphylococcus aureus: NEGATIVE

## 2018-10-25 LAB — ABO/RH: ABO/RH(D): B POS

## 2018-10-25 LAB — APTT: APTT: 35 s (ref 24–36)

## 2018-10-26 NOTE — Progress Notes (Signed)
PCP: donald moore  CARDIOLOGIST: meng hao pa  INFO IN Epic: cardiac clearance meng hao pa 10-10-18, echo 08-09-16 epic, ekg 10-10-18, labs done [id number]: cbc with dif, cmet, pt, ptt, ua, type and screen, hemaglobin a1c, chest xray 10-25-18, osa cpap  INFO ON CHART:none  BLOOD THINNERS AND LAST DOSES:none ____________________________________  PATIENT SYMPTOMS AT TIME OF PREOP: none

## 2018-10-28 ENCOUNTER — Other Ambulatory Visit: Payer: Self-pay | Admitting: Family Medicine

## 2018-10-29 NOTE — Telephone Encounter (Signed)
Next OV 01/2019

## 2018-10-29 NOTE — Progress Notes (Signed)
Anesthesia Chart Review   Case:  [id number] Date/Time:  11/01/18 1015   Procedure:  RIGHT SHOULDER REVISION TO REVERSE TOTAL SHOULDER (Right ) - CHOICE ANESTHESIA WITH INTERSCALENE BLOCK EXPAREL, NEEDS RNFA   Anesthesia type:  Choice   Pre-op diagnosis:  PAINFUL RIGHT SHOULDER HEMIARTHROPLASTY   Location:  [facility] ROOM 07 / [facility]   Surgeon:  Tania Ade, MD      DISCUSSION: 68 yo former smoker with h/o PONV, HTN, DM Type I, GERD, BPH, asthma, OSA w/CPAP, painful right shoulder hemiarthroplasty scheduled for above procedure 11/01/18 with Dr. Tania Ade.   Pt last seen by cardiology 10/10/18.  Seen by Billey Chang, PA-C.  Per his note, "Patient is able to complete at least 4 METS of activity, no further work-up is needed prior to the upcoming shoulder surgery.  Given lack of history of CAD, he is a low risk patient for the intended procedure."  Pt can proceed with planned procedure barring acute status change.  VS: BP (!) 143/72   Pulse 64   Temp 36.7 C (Oral)   Ht 5' 10"  (1.778 m)   Wt 112.5 kg   SpO2 96%   BMI 35.58 kg/m   PROVIDERS: Chipper Herb, MD is PCP   Minus Breeding, MD is Cardiologist  LABS: Labs reviewed: Acceptable for surgery. (all labs ordered are listed, but only abnormal results are displayed)  Labs Reviewed  CBC WITH DIFFERENTIAL/PLATELET - Abnormal; Notable for the following components:      Result Value   MCV 102.6 (*)    Monocytes Absolute 1.2 (*)    All other components within normal limits  COMPREHENSIVE METABOLIC PANEL - Abnormal; Notable for the following components:   AST 47 (*)    ALT 50 (*)    All other components within normal limits  SURGICAL PCR SCREEN  HEMOGLOBIN A1C  APTT  PROTIME-INR  URINALYSIS, ROUTINE W REFLEX MICROSCOPIC  GLUCOSE, CAPILLARY  TYPE AND SCREEN     IMAGES: Chest Xray 10/25/2018 FINDINGS: The heart size and mediastinal contours are within normal limits. Both lungs are clear. The visualized skeletal structures  are unremarkable.  IMPRESSION: No active cardiopulmonary disease.  Carotid Doppler 05/31/2016 Stable carotid artery disease, with 1-39% bilateral ICA stenosis Patent vertebral arteries with antegrade flow.  Normal subclavian arteries, bilaterally.   EKG: 10/10/2018 Rate 73 bpm Sinus rhythm with RBBB  CV: Echo 08/09/2016 Study Conclusions  - Left ventricle: The cavity size was normal. Barthel thickness was   normal. Systolic function was normal. The estimated ejection   fraction was in the range of 60% to 65%. Bulson motion was normal;   there were no regional Shallenberger motion abnormalities. Left   ventricular diastolic function parameters were normal. - Aortic valve: Moderately calcified annulus. Trileaflet;   moderately thickened leaflets. There was very mild stenosis.   Valve area (VTI): 2.15 cm^2. Valve area (Vmax): 1.97 cm^2. - Mitral valve: Mildly calcified annulus. Mildly thickened leaflets   . - Pulmonary arteries: Systolic pressure was mildly increased. PA   peak pressure: 33 mm Hg (S). - Technically difficult study. Past Medical History:  Diagnosis Date  . Arthritis   . Asthma   . BPH (benign prostatic hypertrophy)   . Colon polyps   . Diabetes mellitus without complication (HCC)    TYPE 1   . Gastritis   . GERD (gastroesophageal reflux disease)   . Headache    HX  . Hypertension   . PONV (postoperative nausea and vomiting)   .  Seizures (HCC)    HYPOGLYCEMIC LAST 1 AND 1/2 YRS AGO  . Sleep apnea    uses CPAP nightly  . Testicle trouble    one testicle BORN WITH    Past Surgical History:  Procedure Laterality Date  . BACK SURGERY     94  LOWER   . CARPAL TUNNEL RELEASE Right 10/13/2015   Procedure: RIGHT CARPAL TUNNEL RELEASE;  Surgeon: Daryll Brod, MD;  Location: [place];  Service: Orthopedics;  Laterality: Right;  . CARPAL TUNNEL RELEASE Left 07/05/2016   Procedure: LEFT CARPAL TUNNEL RELEASE;  Surgeon: Daryll Brod, MD;  Location: [place];  Service: Orthopedics;  Laterality: Left;  . HERNIA REPAIR    . INGUINAL HERNIA REPAIR  2003   right   . KNEE ARTHROSCOPY Right 03/03/2016  . LUMBAR DISC SURGERY  8/96   Dr. Coralyn Mark, discectomy  . LUMBAR LAMINECTOMY/DECOMPRESSION MICRODISCECTOMY Right 10/06/2016   Procedure: RIGHT LUMBAR THREE - LUMBAR FOUR  LAMINECTOMY, FORAMINOTOMY AND MICRODISCECTOMY;  Surgeon: Jovita Gamma, MD;  Location: [place];  Service: Neurosurgery;  Laterality: Right;  . SHOULDER SURGERY  11/28/05   left partial  . SHOULDER SURGERY  07/14/2006   RIGHT  . TONSILLECTOMY  AGE 66 OR 4    MEDICATIONS: . UNABLE TO FIND  . albuterol (PROVENTIL HFA;VENTOLIN HFA) 108 (90 Base) MCG/ACT inhaler  . aspirin EC 81 MG tablet  . atorvastatin (LIPITOR) 40 MG tablet  . azithromycin (ZITHROMAX) 250 MG tablet  . b complex vitamins tablet  . Calcium Carbonate-Vitamin D 600-400 MG-UNIT tablet  . Cholecalciferol (VITAMIN D3) 2000 UNITS capsule  . Continuous Blood Gluc Receiver (FREESTYLE LIBRE READER) DEVI  . Continuous Blood Gluc Sensor ([place]) MISC  . fexofenadine (ALLEGRA) 180 MG tablet  . Flaxseed, Linseed, (FLAX SEED OIL PO)  . furosemide (LASIX) 20 MG tablet  . GLUCAGON EMERGENCY 1 MG injection  . Glucosamine-Chondroit-Vit C-Mn (GLUCOSAMINE 1500 COMPLEX) CAPS  . HUMALOG 100 UNIT/ML injection  . Insulin Glargine, 2 Unit Dial, (TOUJEO MAX SOLOSTAR) 300 UNIT/ML SOPN  . montelukast (SINGULAIR) 10 MG tablet  . multivitamin-lutein (OCUVITE-LUTEIN) CAPS capsule  . mupirocin ointment (BACTROBAN) 2 %  . pantoprazole (PROTONIX) 40 MG tablet  . sildenafil (REVATIO) 20 MG tablet  . Testosterone 30 MG/ACT SOLN  . triamcinolone acetonide 40 MG/ML SUSP 40 mg, mupirocin cream 2 % CREA 15 application  . valsartan (DIOVAN) 160 MG tablet   No current facility-administered medications for this encounter.      Maia Plan [facility] Pre-Surgical Testing [phone] 10/29/18 11:35  AM

## 2018-10-29 NOTE — Anesthesia Preprocedure Evaluation (Addendum)
Anesthesia Evaluation  Patient identified by MRN, date of birth, ID band Patient awake    Reviewed: Allergy & Precautions, NPO status , Patient's Chart, lab work & pertinent test results  History of Anesthesia Complications (+) PONV and history of anesthetic complications  Airway Mallampati: II  TM Distance: >3 FB Neck ROM: Full    Dental  (+) Dental Advisory Given, Teeth Intact   Pulmonary asthma , sleep apnea , former smoker,    Pulmonary exam normal breath sounds clear to auscultation       Cardiovascular hypertension, Pt. on medications Normal cardiovascular exam+ Valvular Problems/Murmurs AS  Rhythm:Regular Rate:Normal     Neuro/Psych    GI/Hepatic GERD  ,  Endo/Other  diabetes  Renal/GU Renal disease     Musculoskeletal   Abdominal   Peds  Hematology   Anesthesia Other Findings   Reproductive/Obstetrics                                                            Anesthesia Evaluation  Patient identified by MRN, date of birth, ID band Patient awake    Reviewed: Allergy & Precautions, NPO status , Patient's Chart, lab work & pertinent test results  History of Anesthesia Complications (+) PONV and history of anesthetic complications  Airway Mallampati: II  TM Distance: >3 FB Neck ROM: Full    Dental no notable dental hx. (+) Dental Advisory Given   Pulmonary asthma , former smoker,    Pulmonary exam normal        Cardiovascular hypertension, Pt. on medications Normal cardiovascular exam  Study Conclusions  - Left ventricle: The cavity size was moderately dilated. [place]   thickness was normal. Systolic function was normal. The estimated   ejection fraction was in the range of 60% to 65%. Images were   inadequate for LV Holdman motion assessment. Diastolic dysfunction,   indeterminate grade. Indeterminate filling pressures. - Aortic valve: Moderately calcified  leaflets. There was no   stenosis. There was mild regurgitation. Peak velocity (S): 214   cm/s. Mean gradient (S): 9 mm Hg. Valve area (VTI): 2.29 cm^2.   Valve area (Vmax): 2.42 cm^2. - Left atrium: The atrium was mildly to moderately dilated. - Pulmonic valve: There was mild regurgitation.   Neuro/Psych Seizures -,     GI/Hepatic Neg liver ROS, GERD  ,  Endo/Other  negative endocrine ROSdiabetesMorbid obesity  Renal/GU negative Renal ROS     Musculoskeletal negative musculoskeletal ROS (+)   Abdominal   Peds  Hematology negative hematology ROS (+)   Anesthesia Other Findings Day of surgery medications reviewed with the patient.  Reproductive/Obstetrics                            Anesthesia Physical Anesthesia Plan  ASA: III  Anesthesia Plan: General   Post-op Pain Management:    Induction: Intravenous  Airway Management Planned: Oral ETT  Additional Equipment:   Intra-op Plan:   Post-operative Plan: Extubation in OR  Informed Consent: I have reviewed the patients History and Physical, chart, labs and discussed the procedure including the risks, benefits and alternatives for the proposed anesthesia with the patient or authorized representative who has indicated his/her understanding and acceptance.   Dental advisory given  Plan Discussed with:  CRNA, Anesthesiologist and Surgeon  Anesthesia Plan Comments:        Anesthesia Quick Evaluation  Anesthesia Physical Anesthesia Plan  ASA: III  Anesthesia Plan: General   Post-op Pain Management: GA combined w/ Regional for post-op pain   Induction: Intravenous  PONV Risk Score and Plan: 3 and Ondansetron, Treatment may vary due to age or medical condition and Metaclopromide  Airway Management Planned: Oral ETT  Additional Equipment: None  Intra-op Plan:   Post-operative Plan: Extubation in OR  Informed Consent: I have reviewed the patients History and Physical, chart,  labs and discussed the procedure including the risks, benefits and alternatives for the proposed anesthesia with the patient or authorized representative who has indicated his/her understanding and acceptance.     Dental advisory given  Plan Discussed with: CRNA  Anesthesia Plan Comments: (See PAT note 10/25/18, Konrad Felix, PA-C)       Anesthesia Quick Evaluation

## 2018-11-01 ENCOUNTER — Inpatient Hospital Stay (HOSPITAL_COMMUNITY): Payer: BLUE CROSS/BLUE SHIELD | Admitting: Certified Registered Nurse Anesthetist

## 2018-11-01 ENCOUNTER — Inpatient Hospital Stay (HOSPITAL_COMMUNITY)
Admission: RE | Admit: 2018-11-01 | Discharge: 2018-11-02 | DRG: 483 | Disposition: A | Discharge status: Home / Self Care | Payer: BLUE CROSS/BLUE SHIELD | Attending: Orthopedic Surgery | Admitting: Orthopedic Surgery

## 2018-11-01 ENCOUNTER — Encounter (HOSPITAL_COMMUNITY): Payer: Self-pay | Admitting: *Deleted

## 2018-11-01 ENCOUNTER — Encounter (HOSPITAL_COMMUNITY)
Admission: RE | Disposition: A | Discharge status: Home / Self Care | Payer: Self-pay | Source: Home / Self Care | Attending: Orthopedic Surgery

## 2018-11-01 ENCOUNTER — Other Ambulatory Visit: Payer: Self-pay

## 2018-11-01 ENCOUNTER — Inpatient Hospital Stay (HOSPITAL_COMMUNITY): Payer: BLUE CROSS/BLUE SHIELD

## 2018-11-01 ENCOUNTER — Inpatient Hospital Stay (HOSPITAL_COMMUNITY): Payer: BLUE CROSS/BLUE SHIELD | Admitting: Physician Assistant

## 2018-11-01 DIAGNOSIS — Z79890 Hormone replacement therapy (postmenopausal): Secondary | ICD-10-CM

## 2018-11-01 DIAGNOSIS — Z825 Family history of asthma and other chronic lower respiratory diseases: Secondary | ICD-10-CM

## 2018-11-01 DIAGNOSIS — T8484XA Pain due to internal orthopedic prosthetic devices, implants and grafts, initial encounter: Principal | ICD-10-CM | POA: Diagnosis present

## 2018-11-01 DIAGNOSIS — Z8249 Family history of ischemic heart disease and other diseases of the circulatory system: Secondary | ICD-10-CM

## 2018-11-01 DIAGNOSIS — Z471 Aftercare following joint replacement surgery: Secondary | ICD-10-CM | POA: Diagnosis not present

## 2018-11-01 DIAGNOSIS — K219 Gastro-esophageal reflux disease without esophagitis: Secondary | ICD-10-CM | POA: Diagnosis present

## 2018-11-01 DIAGNOSIS — I10 Essential (primary) hypertension: Secondary | ICD-10-CM | POA: Diagnosis present

## 2018-11-01 DIAGNOSIS — G8918 Other acute postprocedural pain: Secondary | ICD-10-CM | POA: Diagnosis not present

## 2018-11-01 DIAGNOSIS — E109 Type 1 diabetes mellitus without complications: Secondary | ICD-10-CM | POA: Diagnosis present

## 2018-11-01 DIAGNOSIS — Z87891 Personal history of nicotine dependence: Secondary | ICD-10-CM | POA: Diagnosis not present

## 2018-11-01 DIAGNOSIS — Z833 Family history of diabetes mellitus: Secondary | ICD-10-CM

## 2018-11-01 DIAGNOSIS — Z96611 Presence of right artificial shoulder joint: Secondary | ICD-10-CM | POA: Diagnosis not present

## 2018-11-01 DIAGNOSIS — M1712 Unilateral primary osteoarthritis, left knee: Secondary | ICD-10-CM | POA: Diagnosis not present

## 2018-11-01 DIAGNOSIS — Y838 Other surgical procedures as the cause of abnormal reaction of the patient, or of later complication, without mention of misadventure at the time of the procedure: Secondary | ICD-10-CM | POA: Diagnosis present

## 2018-11-01 DIAGNOSIS — N400 Enlarged prostate without lower urinary tract symptoms: Secondary | ICD-10-CM | POA: Diagnosis not present

## 2018-11-01 DIAGNOSIS — Z794 Long term (current) use of insulin: Secondary | ICD-10-CM

## 2018-11-01 DIAGNOSIS — J45909 Unspecified asthma, uncomplicated: Secondary | ICD-10-CM | POA: Diagnosis not present

## 2018-11-01 DIAGNOSIS — Z7982 Long term (current) use of aspirin: Secondary | ICD-10-CM | POA: Diagnosis not present

## 2018-11-01 DIAGNOSIS — G4730 Sleep apnea, unspecified: Secondary | ICD-10-CM | POA: Diagnosis present

## 2018-11-01 HISTORY — PX: TOTAL SHOULDER ARTHROPLASTY: SHX126

## 2018-11-01 LAB — TYPE AND SCREEN
ABO/RH(D): B POS
Antibody Screen: NEGATIVE

## 2018-11-01 LAB — GLUCOSE, CAPILLARY
GLUCOSE-CAPILLARY: 79 mg/dL (ref 70–99)
Glucose-Capillary: 131 mg/dL — ABNORMAL HIGH (ref 70–99)
Glucose-Capillary: 152 mg/dL — ABNORMAL HIGH (ref 70–99)
Glucose-Capillary: 295 mg/dL — ABNORMAL HIGH (ref 70–99)
Glucose-Capillary: 271 mg/dL — ABNORMAL HIGH (ref 70–99)
Glucose-Capillary: 176 mg/dL — ABNORMAL HIGH (ref 70–99)

## 2018-11-01 SURGERY — ARTHROPLASTY, SHOULDER, TOTAL
Anesthesia: General | Site: Shoulder | Laterality: Right

## 2018-11-01 MED ORDER — FENTANYL CITRATE (PF) 100 MCG/2ML IJ SOLN
INTRAMUSCULAR | Status: DC | PRN
  Administered 2018-11-01 (×2): 50 ug via INTRAVENOUS

## 2018-11-01 MED ORDER — PHENYLEPHRINE HCL 10 MG/ML IJ SOLN
INTRAMUSCULAR | Status: AC
  Filled 2018-11-01: qty 2

## 2018-11-01 MED ORDER — MEPERIDINE HCL 50 MG/ML IJ SOLN: 6.2500 mg | INTRAMUSCULAR | Status: DC | PRN

## 2018-11-01 MED ORDER — SODIUM CHLORIDE 0.9 % IR SOLN
Status: DC | PRN
  Administered 2018-11-01: 1000 mL

## 2018-11-01 MED ORDER — PROMETHAZINE HCL 25 MG/ML IJ SOLN: 6.2500 mg | INTRAMUSCULAR | Status: DC | PRN

## 2018-11-01 MED ORDER — MONTELUKAST SODIUM 10 MG PO TABS
10.0000 mg | ORAL_TABLET | Freq: Every day | ORAL | Status: DC
  Administered 2018-11-01: 10 mg via ORAL
  Filled 2018-11-01: qty 1

## 2018-11-01 MED ORDER — FUROSEMIDE 20 MG PO TABS
30.0000 mg | ORAL_TABLET | Freq: Every day | ORAL | Status: DC
  Administered 2018-11-01: 30 mg via ORAL
  Filled 2018-11-01: qty 2

## 2018-11-01 MED ORDER — CEFAZOLIN SODIUM-DEXTROSE 2-4 GM/100ML-% IV SOLN
2.0000 g | Freq: Four times a day (QID) | INTRAVENOUS | Status: AC
  Administered 2018-11-01 – 2018-11-02 (×3): 2 g via INTRAVENOUS
  Filled 2018-11-01 (×3): qty 100

## 2018-11-01 MED ORDER — EPHEDRINE 5 MG/ML INJ
INTRAVENOUS | Status: AC
  Filled 2018-11-01: qty 10

## 2018-11-01 MED ORDER — BUPIVACAINE HCL (PF) 0.5 % IJ SOLN
INTRAMUSCULAR | Status: DC | PRN
  Administered 2018-11-01: 10 mL via PERINEURAL

## 2018-11-01 MED ORDER — SUGAMMADEX SODIUM 200 MG/2ML IV SOLN
INTRAVENOUS | Status: AC
  Filled 2018-11-01: qty 2

## 2018-11-01 MED ORDER — ATORVASTATIN CALCIUM 40 MG PO TABS
40.0000 mg | ORAL_TABLET | Freq: Every day | ORAL | Status: DC
  Administered 2018-11-01: 40 mg via ORAL
  Filled 2018-11-01: qty 1

## 2018-11-01 MED ORDER — ONDANSETRON HCL 4 MG/2ML IJ SOLN
INTRAMUSCULAR | Status: DC | PRN
  Administered 2018-11-01: 4 mg via INTRAVENOUS

## 2018-11-01 MED ORDER — FENTANYL CITRATE (PF) 250 MCG/5ML IJ SOLN
INTRAMUSCULAR | Status: AC
  Filled 2018-11-01: qty 5

## 2018-11-01 MED ORDER — PHENYLEPHRINE 40 MCG/ML (10ML) SYRINGE FOR IV PUSH (FOR BLOOD PRESSURE SUPPORT)
PREFILLED_SYRINGE | INTRAVENOUS | Status: AC
  Filled 2018-11-01: qty 30

## 2018-11-01 MED ORDER — ONDANSETRON HCL 4 MG/2ML IJ SOLN
INTRAMUSCULAR | Status: AC
  Filled 2018-11-01: qty 2

## 2018-11-01 MED ORDER — GLYCOPYRROLATE 0.2 MG/ML IJ SOLN
INTRAMUSCULAR | Status: DC | PRN
  Administered 2018-11-01: 0.2 mg via INTRAVENOUS

## 2018-11-01 MED ORDER — FLEET ENEMA 7-19 GM/118ML RE ENEM: 1.0000 | ENEMA | Freq: Once | RECTAL | Status: DC | PRN

## 2018-11-01 MED ORDER — ONDANSETRON HCL 4 MG PO TABS: 4.0000 mg | ORAL_TABLET | Freq: Four times a day (QID) | ORAL | Status: DC | PRN

## 2018-11-01 MED ORDER — PROPOFOL 10 MG/ML IV BOLUS
INTRAVENOUS | Status: AC
  Filled 2018-11-01: qty 20

## 2018-11-01 MED ORDER — POLYETHYLENE GLYCOL 3350 17 G PO PACK: 17.0000 g | PACK | Freq: Every day | ORAL | Status: DC | PRN

## 2018-11-01 MED ORDER — ALUM & MAG HYDROXIDE-SIMETH 200-200-20 MG/5ML PO SUSP: 30.0000 mL | ORAL | Status: DC | PRN

## 2018-11-01 MED ORDER — FENTANYL CITRATE (PF) 100 MCG/2ML IJ SOLN: 25.0000 ug | INTRAMUSCULAR | Status: DC | PRN

## 2018-11-01 MED ORDER — PROPOFOL 10 MG/ML IV BOLUS
INTRAVENOUS | Status: DC | PRN
  Administered 2018-11-01: 150 mg via INTRAVENOUS

## 2018-11-01 MED ORDER — LIDOCAINE 2% (20 MG/ML) 5 ML SYRINGE
INTRAMUSCULAR | Status: AC
  Filled 2018-11-01: qty 5

## 2018-11-01 MED ORDER — EPHEDRINE SULFATE 50 MG/ML IJ SOLN
INTRAMUSCULAR | Status: DC | PRN
  Administered 2018-11-01: 5 mg via INTRAVENOUS
  Administered 2018-11-01: 10 mg via INTRAVENOUS

## 2018-11-01 MED ORDER — INSULIN ASPART 100 UNIT/ML ~~LOC~~ SOLN
0.0000 [IU] | Freq: Every day | SUBCUTANEOUS | Status: DC
  Administered 2018-11-01: 10 [IU] via SUBCUTANEOUS
  Administered 2018-11-01: 45 [IU] via SUBCUTANEOUS

## 2018-11-01 MED ORDER — CEFAZOLIN SODIUM-DEXTROSE 2-4 GM/100ML-% IV SOLN
2.0000 g | INTRAVENOUS | Status: AC
  Administered 2018-11-01: 2 g via INTRAVENOUS
  Filled 2018-11-01: qty 100

## 2018-11-01 MED ORDER — ROCURONIUM BROMIDE 100 MG/10ML IV SOLN
INTRAVENOUS | Status: AC
  Filled 2018-11-01: qty 1

## 2018-11-01 MED ORDER — CHLORHEXIDINE GLUCONATE 4 % EX LIQD: 60.0000 mL | Freq: Once | CUTANEOUS | Status: DC

## 2018-11-01 MED ORDER — TRANEXAMIC ACID-NACL 1000-0.7 MG/100ML-% IV SOLN
1000.0000 mg | INTRAVENOUS | Status: AC
  Administered 2018-11-01: 1000 mg via INTRAVENOUS
  Filled 2018-11-01: qty 100

## 2018-11-01 MED ORDER — DEXAMETHASONE SODIUM PHOSPHATE 10 MG/ML IJ SOLN
INTRAMUSCULAR | Status: AC
  Filled 2018-11-01: qty 1

## 2018-11-01 MED ORDER — MIDAZOLAM HCL 5 MG/5ML IJ SOLN
INTRAMUSCULAR | Status: DC | PRN
  Administered 2018-11-01 (×2): 1 mg via INTRAVENOUS

## 2018-11-01 MED ORDER — PHENOL 1.4 % MT LIQD
1.0000 | OROMUCOSAL | Status: DC | PRN
  Filled 2018-11-01: qty 177

## 2018-11-01 MED ORDER — SODIUM CHLORIDE 0.9 % IV SOLN
INTRAVENOUS | Status: DC
  Administered 2018-11-01: 13:00:00 via INTRAVENOUS

## 2018-11-01 MED ORDER — VASOPRESSIN 20 UNIT/ML IV SOLN
INTRAVENOUS | Status: AC
  Filled 2018-11-01: qty 1

## 2018-11-01 MED ORDER — LIDOCAINE HCL (CARDIAC) PF 100 MG/5ML IV SOSY
PREFILLED_SYRINGE | INTRAVENOUS | Status: DC | PRN
  Administered 2018-11-01: 100 mg via INTRAVENOUS

## 2018-11-01 MED ORDER — BUPIVACAINE LIPOSOME 1.3 % IJ SUSP
INTRAMUSCULAR | Status: DC | PRN
  Administered 2018-11-01: 10 mL via PERINEURAL

## 2018-11-01 MED ORDER — LACTATED RINGERS IV SOLN
INTRAVENOUS | Status: DC
  Administered 2018-11-01 (×2): via INTRAVENOUS

## 2018-11-01 MED ORDER — ONDANSETRON HCL 4 MG/2ML IJ SOLN: 4.0000 mg | Freq: Four times a day (QID) | INTRAMUSCULAR | Status: DC | PRN

## 2018-11-01 MED ORDER — INSULIN ASPART 100 UNIT/ML ~~LOC~~ SOLN
0.0000 [IU] | Freq: Three times a day (TID) | SUBCUTANEOUS | Status: DC
  Administered 2018-11-01: 3 [IU] via SUBCUTANEOUS

## 2018-11-01 MED ORDER — METOCLOPRAMIDE HCL 5 MG/ML IJ SOLN: 5.0000 mg | Freq: Three times a day (TID) | INTRAMUSCULAR | Status: DC | PRN

## 2018-11-01 MED ORDER — STERILE WATER FOR IRRIGATION IR SOLN
Status: DC | PRN
  Administered 2018-11-01: 1000 mL

## 2018-11-01 MED ORDER — HYDROGEN PEROXIDE 3 % EX SOLN
CUTANEOUS | Status: AC
  Filled 2018-11-01: qty 473

## 2018-11-01 MED ORDER — ASPIRIN EC 325 MG PO TBEC
325.0000 mg | DELAYED_RELEASE_TABLET | Freq: Two times a day (BID) | ORAL | Status: DC
  Administered 2018-11-01 – 2018-11-02 (×2): 325 mg via ORAL
  Filled 2018-11-01 (×2): qty 1

## 2018-11-01 MED ORDER — DEXAMETHASONE SODIUM PHOSPHATE 10 MG/ML IJ SOLN
INTRAMUSCULAR | Status: DC | PRN
  Administered 2018-11-01: 4 mg via INTRAVENOUS

## 2018-11-01 MED ORDER — ROCURONIUM BROMIDE 100 MG/10ML IV SOLN
INTRAVENOUS | Status: DC | PRN
  Administered 2018-11-01: 50 mg via INTRAVENOUS

## 2018-11-01 MED ORDER — BISACODYL 5 MG PO TBEC: 5.0000 mg | DELAYED_RELEASE_TABLET | Freq: Every day | ORAL | Status: DC | PRN

## 2018-11-01 MED ORDER — MIDAZOLAM HCL 2 MG/2ML IJ SOLN
INTRAMUSCULAR | Status: AC
  Filled 2018-11-01: qty 2

## 2018-11-01 MED ORDER — SUGAMMADEX SODIUM 200 MG/2ML IV SOLN
INTRAVENOUS | Status: DC | PRN
  Administered 2018-11-01: 200 mg via INTRAVENOUS

## 2018-11-01 MED ORDER — OXYCODONE HCL 5 MG PO TABS: 10.0000 mg | ORAL_TABLET | ORAL | Status: DC | PRN

## 2018-11-01 MED ORDER — OXYCODONE HCL 5 MG PO TABS: 5.0000 mg | ORAL_TABLET | ORAL | Status: DC | PRN

## 2018-11-01 MED ORDER — IRBESARTAN 150 MG PO TABS
150.0000 mg | ORAL_TABLET | Freq: Every day | ORAL | Status: DC
  Administered 2018-11-01: 150 mg via ORAL
  Filled 2018-11-01: qty 1

## 2018-11-01 MED ORDER — PANTOPRAZOLE SODIUM 40 MG PO TBEC
40.0000 mg | DELAYED_RELEASE_TABLET | Freq: Every day | ORAL | Status: DC
  Administered 2018-11-01: 40 mg via ORAL
  Filled 2018-11-01: qty 1

## 2018-11-01 MED ORDER — VASOPRESSIN 20 UNIT/ML IV SOLN
INTRAVENOUS | Status: DC | PRN
  Administered 2018-11-01 (×3): .5 [IU] via INTRAVENOUS

## 2018-11-01 MED ORDER — PHENYLEPHRINE HCL 10 MG/ML IJ SOLN
INTRAMUSCULAR | Status: DC | PRN
  Administered 2018-11-01: 200 ug via INTRAVENOUS
  Administered 2018-11-01 (×10): 100 ug via INTRAVENOUS

## 2018-11-01 MED ORDER — 0.9 % SODIUM CHLORIDE (POUR BTL) OPTIME
TOPICAL | Status: DC | PRN
  Administered 2018-11-01: 1000 mL

## 2018-11-01 MED ORDER — SODIUM CHLORIDE 0.9 % IV SOLN
INTRAVENOUS | Status: DC | PRN
  Administered 2018-11-01: 100 ug/min via INTRAVENOUS

## 2018-11-01 MED ORDER — ZOLPIDEM TARTRATE 5 MG PO TABS: 5.0000 mg | ORAL_TABLET | Freq: Every evening | ORAL | Status: DC | PRN

## 2018-11-01 MED ORDER — MENTHOL 3 MG MT LOZG: 1.0000 | LOZENGE | OROMUCOSAL | Status: DC | PRN

## 2018-11-01 MED ORDER — METOCLOPRAMIDE HCL 5 MG PO TABS: 5.0000 mg | ORAL_TABLET | Freq: Three times a day (TID) | ORAL | Status: DC | PRN

## 2018-11-01 MED ORDER — ALBUTEROL SULFATE (2.5 MG/3ML) 0.083% IN NEBU: 3.0000 mL | INHALATION_SOLUTION | Freq: Four times a day (QID) | RESPIRATORY_TRACT | Status: DC | PRN

## 2018-11-01 MED ORDER — DOCUSATE SODIUM 100 MG PO CAPS
100.0000 mg | ORAL_CAPSULE | Freq: Two times a day (BID) | ORAL | Status: DC
  Administered 2018-11-01: 100 mg via ORAL
  Filled 2018-11-01: qty 1

## 2018-11-01 MED ORDER — INSULIN ASPART 100 UNIT/ML ~~LOC~~ SOLN
0.0000 [IU] | Freq: Three times a day (TID) | SUBCUTANEOUS | Status: DC
  Administered 2018-11-01: 60 [IU] via SUBCUTANEOUS
  Administered 2018-11-02: 30 [IU] via SUBCUTANEOUS
  Administered 2018-11-02: 10 [IU] via SUBCUTANEOUS

## 2018-11-01 MED ORDER — ACETAMINOPHEN 325 MG PO TABS: 325.0000 mg | ORAL_TABLET | Freq: Four times a day (QID) | ORAL | Status: DC | PRN

## 2018-11-01 MED ORDER — HYDROMORPHONE HCL 1 MG/ML IJ SOLN: 0.5000 mg | INTRAMUSCULAR | Status: DC | PRN

## 2018-11-01 MED ORDER — GLYCOPYRROLATE PF 0.2 MG/ML IJ SOSY
PREFILLED_SYRINGE | INTRAMUSCULAR | Status: AC
  Filled 2018-11-01: qty 1

## 2018-11-01 SURGICAL SUPPLY — 69 items
BAG ZIPLOCK 12X15 (MISCELLANEOUS) ×2 IMPLANT
BASEPLATE SHOULDER FW 15D 29 (Joint) ×2 IMPLANT
BIT DRILL 1.6MX128 (BIT) ×2 IMPLANT
BIT DRILL 2.4X128 (BIT) IMPLANT
BIT DRILL 3.2 PERIPHERAL SCREW (BIT) ×2 IMPLANT
BLADE SAW SAG 73X25 THK (BLADE) ×1
BLADE SAW SGTL 18X1.27X75 (BLADE) IMPLANT
BLADE SAW SGTL 73X25 THK (BLADE) ×1 IMPLANT
CHLORAPREP W/TINT 26ML (MISCELLANEOUS) ×4 IMPLANT
CLEANER TIP ELECTROSURG 2X2 (MISCELLANEOUS) ×2 IMPLANT
COVER BACK TABLE 60X90IN (DRAPES) ×2 IMPLANT
COVER SURGICAL LIGHT HANDLE (MISCELLANEOUS) ×2 IMPLANT
COVER WAND RF STERILE (DRAPES) IMPLANT
DRAPE INCISE IOBAN 66X45 STRL (DRAPES) ×2 IMPLANT
DRAPE ORTHO SPLIT 77X108 STRL (DRAPES) ×2
DRAPE POUCH INSTRU U-SHP 10X18 (DRAPES) ×2 IMPLANT
DRAPE SURG 17X11 SM STRL (DRAPES) ×2 IMPLANT
DRAPE SURG ORHT 6 SPLT 77X108 (DRAPES) ×2 IMPLANT
DRAPE U-SHAPE 47X51 STRL (DRAPES) ×2 IMPLANT
DRSG ADAPTIC 3X8 NADH LF (GAUZE/BANDAGES/DRESSINGS) ×2 IMPLANT
DRSG AQUACEL AG ADV 3.5X10 (GAUZE/BANDAGES/DRESSINGS) ×2 IMPLANT
ELECT BLADE TIP CTD 4 INCH (ELECTRODE) ×2 IMPLANT
ELECT REM PT RETURN 15FT ADLT (MISCELLANEOUS) ×2 IMPLANT
FACESHIELD WRAPAROUND (MASK) ×4 IMPLANT
GLENOSPHERE REV SHOULDER 36 (Joint) ×2 IMPLANT
GLOVE BIO SURGEON STRL SZ7 (GLOVE) ×2 IMPLANT
GLOVE BIO SURGEON STRL SZ7.5 (GLOVE) ×2 IMPLANT
GLOVE BIOGEL PI IND STRL 7.0 (GLOVE) ×1 IMPLANT
GLOVE BIOGEL PI IND STRL 8 (GLOVE) ×1 IMPLANT
GLOVE BIOGEL PI INDICATOR 7.0 (GLOVE) ×1
GLOVE BIOGEL PI INDICATOR 8 (GLOVE) ×1
GOWN STRL REUS W/TWL LRG LVL3 (GOWN DISPOSABLE) ×2 IMPLANT
GOWN STRL REUS W/TWL XL LVL3 (GOWN DISPOSABLE) ×2 IMPLANT
GUIDEWIRE GLENOID 2.5X220 (WIRE) ×2 IMPLANT
HANDPIECE INTERPULSE COAX TIP (DISPOSABLE) ×1
HEMOSTAT SURGICEL 2X14 (HEMOSTASIS) ×2 IMPLANT
HOOD PEEL AWAY FLYTE STAYCOOL (MISCELLANEOUS) ×4 IMPLANT
INSERT SMALL SOCKET 36MM NEU (Insert) ×2 IMPLANT
KIT BASIN OR (CUSTOM PROCEDURE TRAY) ×2 IMPLANT
MANIFOLD NEPTUNE II (INSTRUMENTS) ×2 IMPLANT
NEEDLE MA TROC 1/2 (NEEDLE) ×2 IMPLANT
NS IRRIG 1000ML POUR BTL (IV SOLUTION) ×2 IMPLANT
PACK SHOULDER (CUSTOM PROCEDURE TRAY) ×2 IMPLANT
PROTECTOR NERVE ULNAR (MISCELLANEOUS) ×2 IMPLANT
RETRIEVER SUT HEWSON (MISCELLANEOUS) ×2 IMPLANT
SCREW 5.5X14 (Screw) ×2 IMPLANT
SCREW 5.5X26 (Screw) ×2 IMPLANT
SCREW BONE 6.5 OD 30 NON BIO (Screw) ×2 IMPLANT
SCREW PERIPHERAL 30 (Screw) ×2 IMPLANT
SCREW PERIPHERAL 5.0X34 (Screw) ×2 IMPLANT
SET HNDPC FAN SPRY TIP SCT (DISPOSABLE) ×1 IMPLANT
SLING ARM IMMOBILIZER LRG (SOFTGOODS) ×2 IMPLANT
SMARTMIX MINI TOWER (MISCELLANEOUS)
SPONGE LAP 18X18 RF (DISPOSABLE) ×2 IMPLANT
STAPLER VISISTAT 35W (STAPLE) IMPLANT
STEM HUMERAL REV SHL 6X108 SM (Stem) ×2 IMPLANT
STRIP CLOSURE SKIN 1/2X4 (GAUZE/BANDAGES/DRESSINGS) ×2 IMPLANT
SUCTION FRAZIER HANDLE 12FR (TUBING) ×1
SUCTION TUBE FRAZIER 12FR DISP (TUBING) ×1 IMPLANT
SUPPORT WRAP ARM LG (MISCELLANEOUS) ×2 IMPLANT
SUT ETHIBOND 2 V 37 (SUTURE) ×2 IMPLANT
SUT MNCRL AB 4-0 PS2 18 (SUTURE) ×2 IMPLANT
SUT VIC AB 0 CT1 36 (SUTURE) ×2 IMPLANT
SUT VIC AB 2-0 CT1 27 (SUTURE) ×1
SUT VIC AB 2-0 CT1 TAPERPNT 27 (SUTURE) ×1 IMPLANT
TOWEL OR 17X26 10 PK STRL BLUE (TOWEL DISPOSABLE) ×2 IMPLANT
TOWEL OR NON WOVEN STRL DISP B (DISPOSABLE) ×2 IMPLANT
TOWER SMARTMIX MINI (MISCELLANEOUS) IMPLANT
WATER STERILE IRR 1000ML POUR (IV SOLUTION) ×2 IMPLANT

## 2018-11-01 NOTE — Anesthesia Procedure Notes (Signed)
Anesthesia Regional Block: Interscalene brachial plexus block   Pre-Anesthetic Checklist: ,, timeout performed, Correct Patient, Correct Site, Correct Laterality, Correct Procedure, Correct Position, site marked, Risks and benefits discussed,  Surgical consent,  Pre-op evaluation,  At surgeon's request and post-op pain management  Laterality: Right  Prep: chloraprep       Needles:  Injection technique: Single-shot  Needle Type: Stimulator Needle - 40     Needle Length: 4cm  Needle Gauge: 22     Additional Needles:   Procedures:,,,, ultrasound used (permanent image in chart),,,,  Narrative:  Start time: 11/01/2018 7:18 AM End time: 11/01/2018 7:22 AM Injection made incrementally with aspirations every 5 mL.  Performed by: Personally  Anesthesiologist: [name], MD  Additional Notes: BP cuff, EKG monitors applied. Sedation begun. Nerve location verified with U/S. Anesthetic injected incrementally, slowly , and after neg aspirations under direct u/s guidance. Good perineural spread. Tolerated well.

## 2018-11-01 NOTE — H&P (Signed)
Tony Cantu is an 68 y.o. male.   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  Indicated for revision to reverse TSA to decrease pain and improve function.  Past Medical History:  Diagnosis Date  . Arthritis   . Asthma   . BPH (benign prostatic hypertrophy)   . Colon polyps   . Diabetes mellitus without complication (HCC)    TYPE 1   . Gastritis   . GERD (gastroesophageal reflux disease)   . Headache    HX  . Hypertension   . PONV (postoperative nausea and vomiting)   . Seizures (HCC)    HYPOGLYCEMIC LAST 1 AND 1/2 YRS AGO  . Sleep apnea    uses CPAP nightly  . Testicle trouble    one testicle BORN WITH    Past Surgical History:  Procedure Laterality Date  . BACK SURGERY     94  LOWER   . CARPAL TUNNEL RELEASE Right 10/13/2015   Procedure: RIGHT CARPAL TUNNEL RELEASE;  Surgeon: Daryll Brod, MD;  Location: [facility];  Service: Orthopedics;  Laterality: Right;  . CARPAL TUNNEL RELEASE Left 07/05/2016   Procedure: LEFT CARPAL TUNNEL RELEASE;  Surgeon: Daryll Brod, MD;  Location: [facility];  Service: Orthopedics;  Laterality: Left;  . HERNIA REPAIR    . INGUINAL HERNIA REPAIR  2003   right   . KNEE ARTHROSCOPY Right 03/03/2016  . LUMBAR DISC SURGERY  8/96   Dr. Coralyn Mark, discectomy  . LUMBAR LAMINECTOMY/DECOMPRESSION MICRODISCECTOMY Right 10/06/2016   Procedure: RIGHT LUMBAR THREE - LUMBAR FOUR  LAMINECTOMY, FORAMINOTOMY AND MICRODISCECTOMY;  Surgeon: Jovita Gamma, MD;  Location: [facility];  Service: Neurosurgery;  Laterality: Right;  . SHOULDER SURGERY  11/28/05   left partial  . SHOULDER SURGERY  07/14/2006   RIGHT  . TONSILLECTOMY  AGE 36 OR 4    Family History  Problem Relation Age of Onset  . Heart failure Mother   . Heart attack Mother        in her 46's  . Alzheimer's disease Mother   .  Diabetes Mother   . Asthma Father   . Suicidality Father        in his 81's  . Allergic rhinitis Sister   . Heart failure Maternal Grandmother   . Rheum arthritis Maternal Grandmother   . Breast cancer Maternal Grandmother   . Heart attack Maternal Grandmother        in her 71's   Social History:  reports that he has quit smoking. His smoking use included cigarettes and pipe. He quit after 34.00 years of use. He has never used smokeless tobacco. He reports that he does not drink alcohol or use drugs.  Allergies:  Allergies  Allergen Reactions  . Erythromycin Diarrhea  . Sulfonamide Derivatives Diarrhea    Medications Prior to Admission  Medication Sig Dispense Refill  . albuterol (PROVENTIL HFA;VENTOLIN HFA) 108 (90 Base) MCG/ACT inhaler Inhale 2 puffs into the lungs every 6 (six) hours as needed for wheezing or shortness of breath. 3 Inhaler 1  . aspirin EC 81 MG tablet Take 81 mg by mouth daily with breakfast.     . atorvastatin (LIPITOR) 40 MG tablet TAKE 1 TABLET BY MOUTH  DAILY 90 tablet 0  . b complex vitamins tablet Take 1 tablet by mouth daily at 12 noon.     . Calcium  Carbonate-Vitamin D 600-400 MG-UNIT tablet Take 1 tablet by mouth daily.    . Cholecalciferol (VITAMIN D3) 2000 UNITS capsule Take 2,000 Units by mouth daily at 12 noon.     . Continuous Blood Gluc Receiver (FREESTYLE LIBRE READER) DEVI 1 applicator by Does not apply route as directed. 1 Device 1  . Continuous Blood Gluc Sensor (FREESTYLE LIBRE SENSOR SYSTEM) MISC Check BS eight (8) times a day. Dx E10.9 3 each 3  . fexofenadine (ALLEGRA) 180 MG tablet Take 1 tablet (180 mg total) by mouth daily with breakfast. 90 tablet 3  . Flaxseed, Linseed, (FLAX SEED OIL PO) Take 1,300 mg by mouth daily with breakfast.     . furosemide (LASIX) 20 MG tablet TAKE 1 AND 1/2 TABLETS BY  MOUTH DAILY 135 tablet 0  . GLUCAGON EMERGENCY 1 MG injection INJECT 1MG INTO SKIN ONCE  AS NEEDED (Patient taking differently: Inject 1 mg  into the skin once as needed (low blood sugar). ) 3 each 1  . Glucosamine-Chondroit-Vit C-Mn (GLUCOSAMINE 1500 COMPLEX) CAPS Take 2 capsules by mouth daily at 12 noon.     Marland Kitchen HUMALOG 100 UNIT/ML injection INJECT 20 TO 50 UNITS  SUBCUTANEOUSLY 3 TIMES  DAILY BEFORE MEALS PER SLIDING SCALE (Patient taking differently: Inject 2-30 Units into the skin every 2 (two) hours. ) 140 mL 2  . Insulin Glargine, 2 Unit Dial, (TOUJEO MAX SOLOSTAR) 300 UNIT/ML SOPN Inject 120 Units into the skin daily. (Patient taking differently: Inject 120 Units into the skin at bedtime. ) 30 mL 0  . montelukast (SINGULAIR) 10 MG tablet TAKE 1 TABLET BY MOUTH AT  BEDTIME 90 tablet 0  . multivitamin-lutein (OCUVITE-LUTEIN) CAPS capsule Take 1 capsule by mouth daily at 12 noon.    . mupirocin ointment (BACTROBAN) 2 % Apply 1 application topically 2 (two) times daily. 22 g 0  . pantoprazole (PROTONIX) 40 MG tablet TAKE 1 TABLET BY MOUTH  DAILY 90 tablet 0  . sildenafil (REVATIO) 20 MG tablet TAKE 2-5 TABLETS AS NEEDED PRIOR TO SEXUAL ACTIVITY 50 tablet 1  . Testosterone 30 MG/ACT SOLN USE 1 PUMP UNDER EACH  AXILLA DAILY (TOTAL OF 60MG DAILY) 270 mL 0  . triamcinolone acetonide 40 MG/ML SUSP 40 mg, mupirocin cream 2 % CREA 15 application Apply 1 application topically 2 (two) times daily. (Patient taking differently: Apply 1 application topically daily as needed (irritation). ) 30 g 1  . valsartan (DIOVAN) 160 MG tablet TAKE 1 TABLET BY MOUTH  DAILY 90 tablet 0  . azithromycin (ZITHROMAX) 250 MG tablet As directed (Patient not taking: Reported on 10/19/2018) 6 tablet 0  . UNABLE TO FIND CAMPHOPHENIQUE QHS TO AREA BRELOW NOSE      Results for orders placed or performed during the hospital encounter of 11/01/18 (from the past 48 hour(s))  Glucose, capillary     Status: None   Collection Time: 11/01/18  5:39 AM  Result Value Ref Range   Glucose-Capillary 79 70 - 99 mg/dL   No results found.  Review of Systems  All other systems  reviewed and are negative.   Blood pressure 116/66, pulse 66, temperature 98.5 F (36.9 C), temperature source Oral, resp. rate 18, SpO2 94 %. Physical Exam  Constitutional: He is oriented to person, place, and time. He appears well-developed and well-nourished.  HENT:  Head: Atraumatic.  Eyes: EOM are normal.  Cardiovascular: Intact distal pulses.  Respiratory: Effort normal.  Musculoskeletal:     Comments: R shoulder pain with limited  ROM. NVID.  Neurological: He is alert and oriented to person, place, and time.  Skin: Skin is warm and dry.  Psychiatric: He has a normal mood and affect.     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  Indicated for revision to reverse TSA to decrease pain and improve function. Risks / benefits of surgery discussed Consent on chart  NPO for OR Preop antibiotics    Isabella Stalling, MD 11/01/2018, 7:09 AM

## 2018-11-01 NOTE — Discharge Instructions (Signed)
Discharge Instructions after Reverse Total Shoulder Arthroplasty   A sling has been provided for you. You are to where this at all times, even while sleeping, until your first post operative visit with Dr. Tamera Punt. Use ice on the shoulder intermittently over the first 48 hours after surgery.  Pain medicine has been prescribed for you.  Use your medicine liberally over the first 48 hours, and then you can begin to taper your use. You may take Extra Strength Tylenol or Tylenol only in place of the pain pills. DO NOT take ANY nonsteroidal anti-inflammatory pain medications: Advil, Motrin, Ibuprofen, Aleve, Naproxen or Naprosyn.  Take one aspirin a day for 2 weeks after surgery, unless you have an aspirin sensitivity/allergy or asthma.  Leave your dressing on until your first follow up visit.  You may shower with the dressing.  Hold your arm as if you still have your sling on while you shower. Simply allow the water to wash over the site and then pat dry. Make sure your axilla (armpit) is completely dry after showering.    Please call [phone] during normal business hours or [phone] after hours for any problems. Including the following:  - excessive redness of the incisions - drainage for more than 4 days - fever of more than 101.5 F  *Please note that pain medications will not be refilled after hours or on weekends.

## 2018-11-01 NOTE — Transfer of Care (Signed)
Immediate Anesthesia Transfer of Care Note  Patient: Tony Cantu  Procedure(s) Performed: RIGHT SHOULDER REVISION TO REVERSE TOTAL SHOULDER (Right Shoulder)  Patient Location: PACU  Anesthesia Type:General  Level of Consciousness: awake, alert  and oriented  Airway & Oxygen Therapy: Patient Spontanous Breathing and Patient connected to face mask oxygen  Post-op Assessment: Report given to RN and Post -op Vital signs reviewed and stable  Post vital signs: Reviewed and stable  Last Vitals:  Vitals Value Taken Time  BP 96/54 11/01/2018 10:33 AM  Temp    Pulse 81 11/01/2018 10:36 AM  Resp 21 11/01/2018 10:36 AM  SpO2 94 % 11/01/2018 10:36 AM  Vitals shown include unvalidated device data.  Last Pain:  Vitals:   11/01/18 0546  TempSrc: Oral         Complications: No apparent anesthesia complications

## 2018-11-01 NOTE — Anesthesia Postprocedure Evaluation (Signed)
Anesthesia Post Note  Patient: Tony Cantu  Procedure(s) Performed: RIGHT SHOULDER REVISION TO REVERSE TOTAL SHOULDER (Right Shoulder)     Patient location during evaluation: PACU Anesthesia Type: General Level of consciousness: sedated and patient cooperative Pain management: pain level controlled Vital Signs Assessment: post-procedure vital signs reviewed and stable Respiratory status: spontaneous breathing Cardiovascular status: stable Anesthetic complications: no    Last Vitals:  Vitals:   11/01/18 1740 11/01/18 2150  BP: 118/71 (!) 117/56  Pulse: (!) 103 84  Resp: 16 19  Temp: 36.9 C 36.8 C  SpO2: 95% 95%    Last Pain:  Vitals:   11/01/18 2150  TempSrc: Oral  PainSc:                  [name]

## 2018-11-01 NOTE — Anesthesia Procedure Notes (Signed)
Procedure Name: Intubation Date/Time: 11/01/2018 7:41 AM Performed by: Jonna Munro, CRNA Pre-anesthesia Checklist: Patient identified, Emergency Drugs available, Suction available, Patient being monitored and Timeout performed Patient Re-evaluated:Patient Re-evaluated prior to induction Oxygen Delivery Method: Circle system utilized Preoxygenation: Pre-oxygenation with 100% oxygen Induction Type: IV induction Ventilation: Two handed mask ventilation required Laryngoscope Size: Mac and 3 Grade View: Grade III Tube type: Oral Tube size: 7.5 mm Number of attempts: 1 Airway Equipment and Method: Stylet Placement Confirmation: positive ETCO2 and breath sounds checked- equal and bilateral Secured at: 22 cm Tube secured with: Tape Dental Injury: Teeth and Oropharynx as per pre-operative assessment

## 2018-11-01 NOTE — Op Note (Signed)
Procedure(s): RIGHT SHOULDER REVISION TO REVERSE TOTAL SHOULDER Procedure Note  Tony Cantu male 68 y.o. 11/01/2018  Procedure(s) and Anesthesia Type:    * RIGHT SHOULDER REVISION HEMIARTHROPLASTY TO REVERSE TOTAL SHOULDER - General   Indications:  68 y.o. male  With painful right shoulder hemi-cap placed many years ago, failed conservative management. Pain and dysfunction interfered with quality of life and nonoperative treatment with activity modification, NSAIDS failed.     Surgeon: Isabella Stalling   Assistants: RNFA  Anesthesia: General endotracheal anesthesia with preoperative interscalene block placed by the attending anesthesiologist    Procedure Detail  RIGHT SHOULDER REVISION TO REVERSE TOTAL SHOULDER   Estimated Blood Loss:  300 mL         Drains: none  Blood Given: none          Specimens: none        Complications:  * No complications entered in OR log *         Disposition: PACU - hemodynamically stable.         Condition: stable      OPERATIVE FINDINGS:  The hemi-cap was removed without difficulty.  Bone quality was poor on the humeral side and it was easily removed.  No visible signs of infection.  A Tornier augmented baseplate was used with a full wedge at about the 10 o'clock position posterior superior.  He had excellent fixation on the glenoid side.  A 36 Tornier glenosphere was used.  A DJO Altivate pressfit humeral stem was placed with a  size 6 stem, small proximal body, and a standard 36 poly insert.   PROCEDURE: The patient was identified in the preoperative holding area  where I personally marked the operative site after verifying site, side,  and procedure with the patient. An interscalene block given by  the attending anesthesiologist in the holding area and the patient was taken back to the operating room where all extremities were  carefully padded in position after general anesthesia was induced. She  was placed in a beach-chair  position and the operative upper extremity was  prepped and draped in a standard sterile fashion. An approximately 10-  cm incision was made from the tip of the coracoid process to the center  point of the humerus at the level of the axilla through the previous scar. Dissection was carried  down through subcutaneous tissues to the level of the cephalic vein  which was taken laterally with the deltoid. The pectoralis major was  retracted medially. The subdeltoid space was developed and the lateral  edge of the conjoined tendon was identified. The undersurface of  conjoined tendon was palpated and the musculocutaneous nerve was not in  the field. Retractor was placed underneath the conjoined and second  retractor was placed lateral into the deltoid.  The  biceps tendon was absent.  The subscapularis was thinned and scarred taken down as a peel.  The  joint was then gently externally rotated while the capsule was released  from the humeral neck around to just beyond the 6 o'clock position. At  this point, the joint was dislocated and the humeral head was presented  into the wound.  The metallic hemi-was impacted from the base and easily removed.  The bone beneath the hemi-was noted to be fairly soft and of poor quality. The cutting guide was used to make a fresh cut in the appropriate version, approximately 30 degrees.    The glenoid was exposed with the arm in  an  abducted extended position. The anterior and posterior labrum were  completely excised and the capsule was released circumferentially to  allow for exposure of the glenoid for preparation.  Some of the inferior osteophyte was resected.  The glenoid was noted to be significantly medialized and retroverted. It was felt to be appropriate for the augmented glenoid component.  The guide was used to select the appropriate orientation which was posterior superior at about the 10 o'clock position. The guide was used to place a central  guidepin.  The starting reamer was used followed by the angled reamer getting down to a nice angle surface to accept the implant.  The checking guide was used to verify this.  The boss reamer was then used and the central drill hole was drilled for the 6 5 screw and tapped.  The implant was then placed screwing it into place with excellent fixation and subsequently using the peripheral guide to place for locking screws of the appropriate size.  At this point the 36 standard glenosphere was placed impacted and screwed into position.   The humerus was then again exposed and the diaphyseal reamers were used followed by the metaphyseal reamers. The final broach was left in place in the proximal trial was placed. The joint was reduced and with this implant it was felt that soft tissue tensioning was appropriate with excellent stability and excellent range of motion. Therefore, final humeral stem was placed press-fit with some bone grafting.  And then the trial polyethylene inserts were tested again and the above implant was felt to be the most appropriate for final insertion. The joint was reduced taken through full range of motion and felt to be stable. Soft tissue tension was appropriate.  The joint was then copiously irrigated with pulse  lavage and the wound was then closed. The subscapularis was not repaired.  The integrity of the axillary nerve was verified digitally.   Skin was closed with 2-0 Vicryl in a deep dermal layer and 4-0  Monocryl for skin closure. Steri-Strips were applied. Sterile  dressings were then applied as well as a sling. The patient was allowed  to awaken from general anesthesia, transferred to stretcher, and taken  to recovery room in stable condition.   POSTOPERATIVE PLAN: The patient will be kept in the hospital postoperatively  for pain control and therapy.

## 2018-11-02 LAB — CBC
HCT: 46.2 % (ref 39.0–52.0)
Hemoglobin: 14.3 g/dL (ref 13.0–17.0)
MCH: 32.3 pg (ref 26.0–34.0)
MCHC: 31 g/dL (ref 30.0–36.0)
MCV: 104.3 fL — AB (ref 80.0–100.0)
NRBC: 0 % (ref 0.0–0.2)
Platelets: 118 10*3/uL — ABNORMAL LOW (ref 150–400)
RBC: 4.43 MIL/uL (ref 4.22–5.81)
RDW: 15.2 % (ref 11.5–15.5)
WBC: 11.3 10*3/uL — ABNORMAL HIGH (ref 4.0–10.5)

## 2018-11-02 LAB — BASIC METABOLIC PANEL
Anion gap: 6 (ref 5–15)
BUN: 16 mg/dL (ref 8–23)
CO2: 26 mmol/L (ref 22–32)
Calcium: 8.7 mg/dL — ABNORMAL LOW (ref 8.9–10.3)
Chloride: 107 mmol/L (ref 98–111)
Creatinine, Ser: 0.94 mg/dL (ref 0.61–1.24)
GFR calc Af Amer: >60 mL/min (ref >60)
GFR calc non Af Amer: >60 mL/min (ref >60)
Glucose, Bld: 132 mg/dL — ABNORMAL HIGH (ref 70–99)
Potassium: 4.1 mmol/L (ref 3.5–5.1)
Sodium: 139 mmol/L (ref 135–145)

## 2018-11-02 LAB — GLUCOSE, CAPILLARY
Glucose-Capillary: 133 mg/dL — ABNORMAL HIGH (ref 70–99)
Glucose-Capillary: 108 mg/dL — ABNORMAL HIGH (ref 70–99)

## 2018-11-02 MED ORDER — METHOCARBAMOL 500 MG PO TABS: 500.0000 mg | ORAL_TABLET | Freq: Three times a day (TID) | ORAL | 0 refills | Status: DC | PRN

## 2018-11-02 MED ORDER — ASPIRIN 325 MG PO TBEC: 325.0000 mg | DELAYED_RELEASE_TABLET | Freq: Two times a day (BID) | ORAL | 0 refills | Status: DC

## 2018-11-02 MED ORDER — OXYCODONE-ACETAMINOPHEN 5-325 MG PO TABS: 1.0000 | ORAL_TABLET | ORAL | 0 refills | Status: DC | PRN

## 2018-11-02 NOTE — Evaluation (Signed)
Occupational Therapy Evaluation Patient Details Name: Tony Cantu MRN: [id number] DOB: [birth year] Today's Date: 11/02/2018    History of Present Illness 68 year old man s/p R revision hemiarthroplasty to reverse total shoulder.  H/o bil shoulder sxs   Clinical Impression   Pt was admitted for the above sx. All education was completed. Pt will follow up with Dr Tamera Punt for further rehab.     Follow Up Recommendations  Follow surgeon's recommendation for DC plan and follow-up therapies    Equipment Recommendations  None recommended by OT    Recommendations for Other Services       Precautions / Restrictions Precautions Precautions: Shoulder Type of Shoulder Precautions: sling on at all times except for adls/exercises.  allowed to move elbow, wrist and hand.  NWB.  Slinged position during shower Shoulder Interventions: Shoulder sling/immobilizer Precaution Booklet Issued: Yes (comment) Restrictions RUE Weight Bearing: Non weight bearing      Mobility Bed Mobility Overal bed mobility: Needs Assistance Bed Mobility: Supine to Sit     Supine to sit: Min assist;Mod assist     General bed mobility comments: assist for UB for OOB.  Pt has a recliner, but will likely sleep in his bed  Transfers Overall transfer level: Needs assistance   Transfers: Sit to/from Stand Sit to Stand: Min guard(for safety)              Balance Overall balance assessment: Mild deficits observed, not formally tested                                         ADL either performed or assessed with clinical judgement   ADL Overall ADL's : Needs assistance/impaired Eating/Feeding: Set up   Grooming: Set up   Upper Body Bathing: Moderate assistance   Lower Body Bathing: Moderate assistance   Upper Body Dressing : Maximal assistance   Lower Body Dressing: Maximal assistance   Toilet Transfer: Min guard;Ambulation(chair); mod A for clothing management              General ADL Comments: completed dressing, walked in hall, reviewed shoulder protocol and moved elbow to fingers     Vision         Perception     Praxis      Pertinent Vitals/Pain Pain Assessment: Faces Faces Pain Scale: Hurts little more Pain Location: R shoulder Pain Descriptors / Indicators: Sore Pain Intervention(s): Limited activity within patient's tolerance;Monitored during session;Repositioned(declined further ice)     Hand Dominance     Extremity/Trunk Assessment Upper Extremity Assessment Upper Extremity Assessment: Overall WFL for tasks assessed           Communication Communication Communication: No difficulties   Cognition Arousal/Alertness: Awake/alert Behavior During Therapy: WFL for tasks assessed/performed Overall Cognitive Status: Within Functional Limits for tasks assessed                                     General Comments  walked in hall with min guard for safety; educated wife on use of gait belt    Exercises     Shoulder Instructions      Home Living Family/patient expects to be discharged to:: Private residence Living Arrangements: Spouse/significant other                 Bathroom Shower/Tub: Tub  only   Bathroom Toilet: Handicapped height                Prior Functioning/Environment Level of Independence: Independent        Comments: wife is an Therapist, sports and will assist        OT Problem List:        OT Treatment/Interventions:      OT Goals(Current goals can be found in the care plan section) Acute Rehab OT Goals Patient Stated Goal: home ASAP OT Goal Formulation: All assessment and education complete, DC therapy  OT Frequency:     Barriers to D/C:            Co-evaluation              AM-PAC OT "6 Clicks" Daily Activity     Outcome Measure Help from another person eating meals?: A Little Help from another person taking care of personal grooming?: A Little Help from another person  toileting, which includes using toliet, bedpan, or urinal?: A Lot Help from another person bathing (including washing, rinsing, drying)?: A Lot Help from another person to put on and taking off regular upper body clothing?: A Lot Help from another person to put on and taking off regular lower body clothing?: A Lot 6 Click Score: 14   End of Session    Activity Tolerance: Patient tolerated treatment well Patient left: in chair;with call bell/phone within reach;with family/visitor present  OT Visit Diagnosis: Pain Pain - Right/Left: Right Pain - part of body: Shoulder                Time: 1470-9295 OT Time Calculation (min): 24 min Charges:  OT General Charges $OT Visit: 1 Visit OT Evaluation $OT Eval Low Complexity: 1 Low OT Treatments $Self Care/Home Management : 8-22 mins  Lesle Chris, OTR/L Acute Rehabilitation Services [phone] [facility] pager [phone] office 11/02/2018  [place] 11/02/2018, 9:29 AM

## 2018-11-02 NOTE — Plan of Care (Signed)
  Problem: Pain Managment: Goal: General experience of comfort will improve Outcome: Progressing   Problem: Activity: Goal: Risk for activity intolerance will decrease Outcome: Progressing   Problem: Clinical Measurements: Goal: Cardiovascular complication will be avoided Outcome: Progressing   Problem: Skin Integrity: Goal: Risk for impaired skin integrity will decrease Outcome: Progressing   Problem: Clinical Measurements: Goal: Will remain free from infection Outcome: Progressing

## 2018-11-02 NOTE — Discharge Summary (Signed)
Patient ID: Tony Cantu MRN: [id number] DOB/AGE: 1951/02/13 68 y.o.  Admit date: 11/01/2018 Discharge date: 11/02/2018  Admission Diagnoses:  Active Problems:   H/O total shoulder replacement, right   Discharge Diagnoses:  Same  Past Medical History:  Diagnosis Date  . Arthritis   . Asthma   . BPH (benign prostatic hypertrophy)   . Colon polyps   . Diabetes mellitus without complication (HCC)    TYPE 1   . Gastritis   . GERD (gastroesophageal reflux disease)   . Headache    HX  . Hypertension   . PONV (postoperative nausea and vomiting)   . Seizures (HCC)    HYPOGLYCEMIC LAST 1 AND 1/2 YRS AGO  . Sleep apnea    uses CPAP nightly  . Testicle trouble    one testicle BORN WITH    Surgeries: Procedure(s): RIGHT SHOULDER REVISION TO REVERSE TOTAL SHOULDER on 11/01/2018   Consultants:   Discharged Condition: Improved  Hospital Course: Tony Cantu is an 68 y.o. male who was admitted 11/01/2018 for operative treatment of Painful right shoulder hemiarthroplasty. Patient has severe unremitting pain that affects sleep, daily activities, and work/hobbies. After pre-op clearance the patient was taken to the operating room on 11/01/2018 and underwent  Procedure(s): RIGHT SHOULDER REVISION TO REVERSE TOTAL SHOULDER.  Postoperative day #1 the patient was noted to be in good spirits and had a clean dry dressing.  Pain was minimal to none. Distally he was noted to be neurovascularly intact.  He was discharged home in stable condition postoperative day #1 with instructions from occupational therapy and follow-up in weeks.    Patient was given perioperative antibiotics:  Anti-infectives (From admission, onward)   Start     Dose/Rate Route Frequency Ordered Stop   11/01/18 1400  ceFAZolin (ANCEF) IVPB 2g/100 mL premix     2 g 200 mL/hr over 30 Minutes Intravenous Every 6 hours 11/01/18 1208 11/02/18 0242   11/01/18 0600  ceFAZolin (ANCEF) IVPB 2g/100 mL premix     2 g 200 mL/hr  over 30 Minutes Intravenous On call to O.R. 11/01/18 1694 11/01/18 5038       Patient was given sequential compression devices, early ambulation, and chemoprophylaxis to prevent DVT.  Patient benefited maximally from hospital stay and there were no complications.    Recent vital signs:  Patient Vitals for the past 24 hrs:  BP Temp Temp src Pulse Resp SpO2 Height Weight  11/02/18 0557 114/61 98.1 F (36.7 C) Axillary (!) 59 17 96 % - -  11/02/18 0159 107/62 97.8 F (36.6 C) Axillary 60 18 98 % - -  11/01/18 2302 - - - 82 18 95 % - -  11/01/18 2150 (!) 117/56 98.2 F (36.8 C) Oral 84 19 95 % - -  11/01/18 1740 118/71 98.4 F (36.9 C) Oral (!) 103 16 95 % - -  11/01/18 1517 130/69 98 F (36.7 C) Oral 92 16 96 % - -  11/01/18 1412 (!) 107/54 97.9 F (36.6 C) Oral 81 16 96 % - -  11/01/18 1303 (!) 126/52 98 F (36.7 C) Oral 83 16 97 % - -  11/01/18 1226 - - - - - - 5' 10"  (1.778 m) 112.5 kg  11/01/18 1159 114/62 - - 79 18 96 % - -  11/01/18 1145 108/61 - - 69 (!) 23 95 % - -  11/01/18 1130 (!) 105/56 - - 73 19 94 % - -  11/01/18 1115 102/61 - - 71 (!)  23 96 % - -  11/01/18 1100 (!) 104/59 - - 74 15 98 % - -  11/01/18 1045 (!) 100/55 - - 78 (!) 25 94 % - -  11/01/18 1033 (!) 96/54 98.1 F (36.7 C) - 81 18 94 % - -     Recent laboratory studies:  Recent Labs    11/02/18 0457  WBC 11.3*  HGB 14.3  HCT 46.2  PLT 118*  NA 139  K 4.1  CL 107  CO2 26  BUN 16  CREATININE 0.94  GLUCOSE 132*  CALCIUM 8.7*     Discharge Medications:   Resume Home medications.  Percocet 5 mg p.o. q.4 hours p.r.n.dispense #30  Methocarbamol 500 mg p.o. t.i.d. p.r.n. spasm dispense #30  Aspirin 325 mg p.o. b.i.d. for 3 weeks  Diagnostic Studies: Dg Chest 2 View  Result Date: 10/25/2018 CLINICAL DATA:  Preop for right shoulder surgery. EXAM: CHEST - 2 VIEW COMPARISON:  Radiograph of August 06, 2016. FINDINGS: The heart size and mediastinal contours are within normal limits. Both lungs  are clear. The visualized skeletal structures are unremarkable. IMPRESSION: No active cardiopulmonary disease. Electronically Signed   By: Marijo Conception, M.D.   On: 10/25/2018 15:29   Ct Shoulder Right Wo Contrast  Result Date: 10/10/2018 CLINICAL DATA:  Right shoulder pain and limited range of motion. History of prior shoulder replacement. No known injury. EXAM: CT OF THE UPPER RIGHT EXTREMITY WITHOUT CONTRAST TECHNIQUE: Multidetector CT imaging of the upper right extremity was performed according to the standard protocol. COMPARISON:  None. FINDINGS: Bones/Joint/Cartilage The patient is status post resurfacing arthroplasty of the humeral head. Hardware appears intact without evidence of loosening. The glenoid is flattened and remodeled and there is bone-on-arthroplasty joint space narrowing. The humeral head is high-riding. No lytic or sclerotic lesion. No focal bony abnormality. Ligaments Suboptimally assessed by CT. Muscles and Tendons Streak artifact limits evaluation of the distal cuff tendons. No retracted tendon is identified. Musculature of the shoulder girdle is preserved and normal in appearance. Soft tissues No fluid collection or mass is identified. Imaged lung parenchyma is clear. No pleural effusion is identified. IMPRESSION: Status post resurfacing arthroplasty of the right shoulder. The glenoid is flattened and remodeled and there is bone on arthroplasty joint space narrowing. The humeral head is high-riding but no rotator cuff tear is identified on this study. Distal cuff tendons are somewhat difficult to see due to streak artifact. Moderate acromioclavicular osteoarthritis. Electronically Signed   By: Inge Rise M.D.   On: 10/10/2018 12:08   Dg Shoulder Right Port  Result Date: 11/01/2018 CLINICAL DATA:  Status post right shoulder replacement earlier today. EXAM: PORTABLE RIGHT SHOULDER COMPARISON:  Right shoulder CT dated 10/10/2018 and chest radiographs dated 10/25/2018. FINDINGS:  Interval removal of the previously demonstrated humeral head prosthesis placement of a reverse total shoulder prosthesis. Small amount of calcific density in the expected position of the rotator cuff. Small amount of linear atelectasis in the right lower lung zone. No fracture or dislocation seen. IMPRESSION: Satisfactory postoperative appearance of a right reverse total shoulder prosthesis. Electronically Signed   By: Claudie Revering M.D.   On: 11/01/2018 14:14    Disposition: Discharge disposition: 01-Home or Self Care       Discharge Instructions    Call MD / Call 911   Complete by:  As directed    If you experience chest pain or shortness of breath, CALL 911 and be transported to the hospital emergency room.  If you develope a fever above 101 F, pus (white drainage) or increased drainage or redness at the wound, or calf pain, call your surgeon's office.   Constipation Prevention   Complete by:  As directed    Drink plenty of fluids.  Prune juice may be helpful.  You may use a stool softener, such as Colace (over the counter) 100 mg twice a day.  Use MiraLax (over the counter) for constipation as needed.   Diet - low sodium heart healthy   Complete by:  As directed    Driving restrictions   Complete by:  As directed    No driving for 6 weeks   Increase activity slowly as tolerated   Complete by:  As directed       Follow-up Information    Tania Ade, MD. Schedule an appointment as soon as possible for a visit in 2 weeks.   Specialty:  Orthopedic Surgery Contact information: [address] [place] [address] [phone]            Signed: Isabella Stalling 11/02/2018, 8:06 AM

## 2018-11-08 ENCOUNTER — Encounter (HOSPITAL_COMMUNITY): Payer: Self-pay | Admitting: Orthopedic Surgery

## 2018-11-12 DIAGNOSIS — Z9889 Other specified postprocedural states: Secondary | ICD-10-CM | POA: Diagnosis not present

## 2018-11-12 DIAGNOSIS — Z96611 Presence of right artificial shoulder joint: Secondary | ICD-10-CM | POA: Diagnosis not present

## 2018-11-12 DIAGNOSIS — M1712 Unilateral primary osteoarthritis, left knee: Secondary | ICD-10-CM | POA: Diagnosis not present

## 2018-11-12 DIAGNOSIS — Z471 Aftercare following joint replacement surgery: Secondary | ICD-10-CM | POA: Diagnosis not present

## 2018-11-16 DIAGNOSIS — E108 Type 1 diabetes mellitus with unspecified complications: Secondary | ICD-10-CM | POA: Diagnosis not present

## 2018-11-21 DIAGNOSIS — Z794 Long term (current) use of insulin: Secondary | ICD-10-CM | POA: Diagnosis not present

## 2018-11-21 DIAGNOSIS — E108 Type 1 diabetes mellitus with unspecified complications: Secondary | ICD-10-CM | POA: Diagnosis not present

## 2018-12-10 DIAGNOSIS — Z96611 Presence of right artificial shoulder joint: Secondary | ICD-10-CM | POA: Diagnosis not present

## 2018-12-10 DIAGNOSIS — M1712 Unilateral primary osteoarthritis, left knee: Secondary | ICD-10-CM | POA: Diagnosis not present

## 2018-12-10 DIAGNOSIS — Z09 Encounter for follow-up examination after completed treatment for conditions other than malignant neoplasm: Secondary | ICD-10-CM | POA: Diagnosis not present

## 2018-12-20 DIAGNOSIS — M25562 Pain in left knee: Secondary | ICD-10-CM | POA: Diagnosis not present

## 2018-12-20 DIAGNOSIS — M7061 Trochanteric bursitis, right hip: Secondary | ICD-10-CM | POA: Diagnosis not present

## 2018-12-31 ENCOUNTER — Encounter: Payer: Self-pay | Admitting: Family Medicine

## 2019-01-01 ENCOUNTER — Other Ambulatory Visit: Payer: Self-pay

## 2019-01-01 ENCOUNTER — Encounter: Payer: Self-pay | Admitting: Physical Therapy

## 2019-01-01 ENCOUNTER — Ambulatory Visit: Payer: BLUE CROSS/BLUE SHIELD | Attending: Orthopedic Surgery | Admitting: Physical Therapy

## 2019-01-01 ENCOUNTER — Other Ambulatory Visit: Payer: Self-pay | Admitting: *Deleted

## 2019-01-01 DIAGNOSIS — N400 Enlarged prostate without lower urinary tract symptoms: Secondary | ICD-10-CM

## 2019-01-01 DIAGNOSIS — G8929 Other chronic pain: Secondary | ICD-10-CM

## 2019-01-01 DIAGNOSIS — M25562 Pain in left knee: Secondary | ICD-10-CM | POA: Diagnosis not present

## 2019-01-01 DIAGNOSIS — M25551 Pain in right hip: Secondary | ICD-10-CM

## 2019-01-01 DIAGNOSIS — E349 Endocrine disorder, unspecified: Secondary | ICD-10-CM

## 2019-01-01 DIAGNOSIS — E559 Vitamin D deficiency, unspecified: Secondary | ICD-10-CM

## 2019-01-01 DIAGNOSIS — I10 Essential (primary) hypertension: Secondary | ICD-10-CM

## 2019-01-01 DIAGNOSIS — D696 Thrombocytopenia, unspecified: Secondary | ICD-10-CM

## 2019-01-01 DIAGNOSIS — E7800 Pure hypercholesterolemia, unspecified: Secondary | ICD-10-CM

## 2019-01-01 DIAGNOSIS — E10649 Type 1 diabetes mellitus with hypoglycemia without coma: Secondary | ICD-10-CM

## 2019-01-01 NOTE — Therapy (Signed)
[facility] [place], [place], [address] Phone: [phone]   Fax:  [phone]  Physical Therapy Evaluation  Patient Details  Name: Tony Cantu MRN: [id number] Date of Birth: [birth year] Referring Provider (PT): Frederik Pear, MD   Encounter Date: 01/01/2019  PT End of Session - 01/01/19 1031    Visit Number  1    Number of Visits  12    Date for PT Re-Evaluation  02/26/19    PT Start Time  0923    PT Stop Time  1013    PT Time Calculation (min)  50 min    Activity Tolerance  Patient tolerated treatment well    Behavior During Therapy  [facility] for tasks assessed/performed       Past Medical History:  Diagnosis Date  . Arthritis   . Asthma   . BPH (benign prostatic hypertrophy)   . Colon polyps   . Diabetes mellitus without complication (HCC)    TYPE 1   . Gastritis   . GERD (gastroesophageal reflux disease)   . Headache    HX  . Hypertension   . PONV (postoperative nausea and vomiting)   . Seizures (HCC)    HYPOGLYCEMIC LAST 1 AND 1/2 YRS AGO  . Sleep apnea    uses CPAP nightly  . Testicle trouble    one testicle BORN WITH    Past Surgical History:  Procedure Laterality Date  . BACK SURGERY     94  LOWER   . CARPAL TUNNEL RELEASE Right 10/13/2015   Procedure: RIGHT CARPAL TUNNEL RELEASE;  Surgeon: Daryll Brod, MD;  Location: [facility];  Service: Orthopedics;  Laterality: Right;  . CARPAL TUNNEL RELEASE Left 07/05/2016   Procedure: LEFT CARPAL TUNNEL RELEASE;  Surgeon: Daryll Brod, MD;  Location: [place];  Service: Orthopedics;  Laterality: Left;  . HERNIA REPAIR    . INGUINAL HERNIA REPAIR  2003   right   . KNEE ARTHROSCOPY Right 03/03/2016  . LUMBAR DISC SURGERY  8/96   Dr. Coralyn Mark, discectomy  . LUMBAR LAMINECTOMY/DECOMPRESSION MICRODISCECTOMY Right 10/06/2016   Procedure: RIGHT LUMBAR THREE - LUMBAR FOUR  LAMINECTOMY, FORAMINOTOMY AND MICRODISCECTOMY;  Surgeon: Jovita Gamma, MD;  Location: [facility];  Service: Neurosurgery;  Laterality: Right;  . SHOULDER SURGERY  11/28/05   left partial  . SHOULDER SURGERY  07/14/2006   RIGHT  . TONSILLECTOMY  AGE 68 OR 4  . TOTAL SHOULDER ARTHROPLASTY Right 11/01/2018   Procedure: RIGHT SHOULDER REVISION TO REVERSE TOTAL SHOULDER;  Surgeon: Tania Ade, MD;  Location: [facility];  Service: Orthopedics;  Laterality: Right;  CHOICE ANESTHESIA WITH INTERSCALENE BLOCK EXPAREL, NEEDS RNFA    There were no vitals filed for this visit.   Subjective Assessment - 01/01/19 1308    Subjective  COVID-19 screening performed prior to patient entering the building. Patient arrives to physical therapy with reports of right hip pain and left knee pain that limits his ability to walk and comfortably negotiate steps. Patient reports hip pain has been ongoing for "quite a few years" and the knee pain began October 2019 after turning on a planted foot. Patient had an x-ray of his knee and hip to which reported were normal. Patient states his left knee pain causes difficulties with stair negotiation and the hip pain limits his ability to walk greater than 1 mile for exercise. Patient's pain at worst is 7/10 and pain at best is 0/10 with rest. Patient's goals are  to decrease pain, improve mobility, improve strength, and return walking program for exercise.     Pertinent History  DM    Limitations  Standing;Walking;House hold activities    How long can you walk comfortably?  less than 1 mile    Diagnostic tests  x-ray: normal    Patient Stated Goals  get better and return to walking program    Currently in Pain?  No/denies   no pain at rest        [facility] PT Assessment - 01/01/19 0001      Assessment   Medical Diagnosis  left knee pain and upper peroneal tendonitis, right greater trochanter bursitis    Referring Provider (PT)  Frederik Pear, MD    Onset Date/Surgical Date  --   October 2019   Next MD Visit  2 months    Prior Therapy  no       Precautions   Precautions  None      Restrictions   Weight Bearing Restrictions  No      Balance Screen   Has the patient fallen in the past 6 months  No    Has the patient had a decrease in activity level because of a fear of falling?   No    Is the patient reluctant to leave their home because of a fear of falling?   No      Home Environment   Living Environment  Private residence    Living Arrangements  Spouse/significant other    Type of [place] to enter    Entrance Stairs-Number of Steps  3    Home Layout  Multi-level      Prior Function   Level of Independence  Independent      ROM / Strength   AROM / PROM / Strength  AROM;Strength      AROM   AROM Assessment Site  Knee;Hip    Right/Left Hip  Right    Right Hip Flexion  103    Right/Left Knee  Left    Left Knee Extension  0    Left Knee Flexion  120      Strength   Strength Assessment Site  Hip;Knee    Right/Left Hip  Right    Right Hip Flexion  4/5    Right Hip Extension  4-/5    Right Hip ABduction  4/5    Right/Left Knee  Left    Left Knee Flexion  4+/5    Left Knee Extension  4+/5      Palpation   Patella mobility  WFL    Palpation comment  slight tenderness to palpation to left distal ITB, slight tenderness to right greater trochanter and lateral distal ITB      Transfers   Transfers  Independent with all Transfers      Ambulation/Gait   Gait Pattern  Step-through pattern;Decreased stance time - left;Decreased stride length;Antalgic                Objective measurements completed on examination: See above findings.              PT Education - 01/01/19 1054    Education Details  Bridges, SLR, clam shells, hip adduction, LAQ, hamstring stretch    Person(s) Educated  Patient    Methods  Explanation;Demonstration;Handout    Comprehension  Verbalized understanding;Returned demonstration          PT Long Term Goals -  01/01/19 1055      PT LONG  TERM GOAL #1   Title  Patient will be independent with HEP and its progression    Time  6    Period  Weeks    Status  New      PT LONG TERM GOAL #2   Title  Patient will demonstrate 5/5 left knee MMT to improve stability during functional tasks.    Time  6    Period  Weeks    Status  New      PT LONG TERM GOAL #3   Title  Patient will demonstrate 4+/5 or greater right hip MMT to improve stability during funtional tasks.    Time  6    Period  Weeks    Status  New      PT LONG TERM GOAL #4   Title  Patient will report ability to walk with right hip pain less than 3/10.    Time  6    Period  Weeks    Status  New      PT LONG TERM GOAL #5   Title  Patient will report ability to negotiate steps with a reciprocating gait pattern with pain less than 3/10 to safely access basement.     Baseline  6    Period  Weeks    Status  New             Plan - 01/01/19 1310    Clinical Impression Statement  Patient is a 68 year old male who presents to physical therapy with right hip pain, left knee pain, and decreased LE MMT. Patient noted with slight tenderness to palpation to left distal ITB and right greater trochanter. Patient provided with HEP and educated on importance of performing to maximize physical therapy. Patient reported understanding. Patient would benefit from skilled physical therapy to decrease pain and address deficits.     Personal Factors and Comorbidities  Age;Comorbidity 1    Comorbidities  DM    Examination-Activity Limitations  Stairs;Stand    Stability/Clinical Decision Making  Stable/Uncomplicated    Clinical Decision Making  Low    Rehab Potential  Good    PT Frequency  2x / week    PT Duration  6 weeks    PT Treatment/Interventions  ADLs/Self Care Home Management;Cryotherapy;Ultrasound;Moist Heat;Iontophoresis 79m/ml Dexamethasone;Electrical Stimulation;Stair training;Gait training;Therapeutic activities;Therapeutic exercise;Balance training;Neuromuscular  re-education;Manual techniques;Dry needling;Passive range of motion;Vasopneumatic Device;Taping;Patient/family education    PT Next Visit Plan  Nustep, hip and knee strengthening, modalities PRN for pain relief. Iontophoresis per referral    PT Home Exercise Plan  see patient education section    Consulted and Agree with Plan of Care  Patient       Patient will benefit from skilled therapeutic intervention in order to improve the following deficits and impairments:  Pain, Decreased activity tolerance, Decreased range of motion, Decreased strength, Difficulty walking  Visit Diagnosis: Chronic pain of left knee - Plan: PT plan of care cert/re-cert  Pain in right hip - Plan: PT plan of care cert/re-cert     Problem List Patient Active Problem List   Diagnosis Date Noted  . H/O total shoulder replacement, right 11/01/2018  . Paroxysmal tachycardia ([facility] 01/11/2017  . Nonrheumatic aortic valve stenosis 01/11/2017  . Type 1 diabetes mellitus without complication ([facility] 053/64/6803 . Aortic atherosclerosis ([facility] 11/23/2016  . HNP (herniated nucleus pulposus), lumbar 10/06/2016  . Acute renal injury ([facility] 08/06/2016  . Diarrhea 08/06/2016  . Fever 08/06/2016  .  Hyperbilirubinemia 08/06/2016  . Septic shock ([place]) 08/06/2016  . Lactic acidosis 08/06/2016  . Type 2 diabetes mellitus with hyperlipidemia ([place]) 01/18/2016  . Inguinal hernia 12/10/2015  . Right groin pain 11/30/2015  . Thrombocytopenia ([place]) 07/21/2014  . Bilateral carotid bruits 05/11/2014  . Vitamin D deficiency 09/17/2013  . BPH (benign prostatic hyperplasia) 05/22/2013  . 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 Diabetes type 2, controlled ([place]) 01/10/2013  . Essential hypertension, benign 01/10/2013  . OSA (obstructive sleep apnea) 05/16/2012  . Edema 02/21/2012  . At risk for coronary artery disease 03/20/2011  . Obesity 03/20/2011  . WEIGHT GAIN, ABNORMAL 11/09/2010  . Hyperlipidemia 06/22/2010  . ALLERGIC  RHINITIS 06/22/2010  . ASTHMA 06/22/2010  . COUGH 06/22/2010   Gabriela Eves, PT, DPT 01/01/2019, 1:18 PM  [facility] [address] [place], [place], [address] Phone: [phone]   Fax:  [phone]  Name: AYVIN LIPINSKI MRN: [id number] Date of Birth: 10-17-1950

## 2019-01-04 ENCOUNTER — Encounter: Payer: Self-pay | Admitting: Physical Therapy

## 2019-01-04 ENCOUNTER — Other Ambulatory Visit: Payer: Self-pay

## 2019-01-04 ENCOUNTER — Other Ambulatory Visit: Payer: Self-pay | Admitting: Family Medicine

## 2019-01-04 ENCOUNTER — Ambulatory Visit: Payer: BLUE CROSS/BLUE SHIELD | Admitting: Physical Therapy

## 2019-01-04 DIAGNOSIS — G4733 Obstructive sleep apnea (adult) (pediatric): Secondary | ICD-10-CM | POA: Diagnosis not present

## 2019-01-04 DIAGNOSIS — G8929 Other chronic pain: Secondary | ICD-10-CM

## 2019-01-04 DIAGNOSIS — M25562 Pain in left knee: Secondary | ICD-10-CM | POA: Diagnosis not present

## 2019-01-04 DIAGNOSIS — M25551 Pain in right hip: Secondary | ICD-10-CM | POA: Diagnosis not present

## 2019-01-04 NOTE — Therapy (Signed)
[facility] [place], [place], [address] Phone: [phone]   Fax:  [phone]  Physical Therapy Treatment  Patient Details  Name: Tony Cantu MRN: [id number] Date of Birth: 1950-12-05 Referring Provider (PT): Frederik Pear, MD   Encounter Date: 01/04/2019  PT End of Session - 01/04/19 0811    Visit Number  2    Number of Visits  12    Date for PT Re-Evaluation  02/26/19    PT Start Time  0810    PT Stop Time  0904    PT Time Calculation (min)  54 min    Activity Tolerance  Patient tolerated treatment well    Behavior During Therapy  [facility] for tasks assessed/performed       Past Medical History:  Diagnosis Date  . Arthritis   . Asthma   . BPH (benign prostatic hypertrophy)   . Colon polyps   . Diabetes mellitus without complication (HCC)    TYPE 1   . Gastritis   . GERD (gastroesophageal reflux disease)   . Headache    HX  . Hypertension   . PONV (postoperative nausea and vomiting)   . Seizures (HCC)    HYPOGLYCEMIC LAST 1 AND 1/2 YRS AGO  . Sleep apnea    uses CPAP nightly  . Testicle trouble    one testicle BORN WITH    Past Surgical History:  Procedure Laterality Date  . BACK SURGERY     94  LOWER   . CARPAL TUNNEL RELEASE Right 10/13/2015   Procedure: RIGHT CARPAL TUNNEL RELEASE;  Surgeon: Daryll Brod, MD;  Location: [place];  Service: Orthopedics;  Laterality: Right;  . CARPAL TUNNEL RELEASE Left 07/05/2016   Procedure: LEFT CARPAL TUNNEL RELEASE;  Surgeon: Daryll Brod, MD;  Location: [place];  Service: Orthopedics;  Laterality: Left;  . HERNIA REPAIR    . INGUINAL HERNIA REPAIR  2003   right   . KNEE ARTHROSCOPY Right 03/03/2016  . LUMBAR DISC SURGERY  8/96   Dr. Coralyn Mark, discectomy  . LUMBAR LAMINECTOMY/DECOMPRESSION MICRODISCECTOMY Right 10/06/2016   Procedure: RIGHT LUMBAR THREE - LUMBAR FOUR  LAMINECTOMY, FORAMINOTOMY AND MICRODISCECTOMY;  Surgeon: Jovita Gamma, MD;  Location: [place];  Service: Neurosurgery;  Laterality: Right;  . SHOULDER SURGERY  11/28/05   left partial  . SHOULDER SURGERY  07/14/2006   RIGHT  . TONSILLECTOMY  AGE 20 OR 4  . TOTAL SHOULDER ARTHROPLASTY Right 11/01/2018   Procedure: RIGHT SHOULDER REVISION TO REVERSE TOTAL SHOULDER;  Surgeon: Tania Ade, MD;  Location: [facility];  Service: Orthopedics;  Laterality: Right;  CHOICE ANESTHESIA WITH INTERSCALENE BLOCK EXPAREL, NEEDS RNFA    There were no vitals filed for this visit.  Subjective Assessment - 01/04/19 0809    Subjective  COVID 19 screening performed on patient prior to entering building. Patient reports no current hip or knee pain upon arrival. Reports hip pain only happens when he walks for certain distance.    Pertinent History  DM    Limitations  Standing;Walking;House hold activities    How long can you walk comfortably?  less than 1 mile    Diagnostic tests  x-ray: normal    Patient Stated Goals  get better and return to walking program    Currently in Pain?  No/denies         [facility] PT Assessment - 01/04/19 0001      Assessment   Medical Diagnosis  left knee  pain and upper peroneal tendonitis, right greater trochanter bursitis    Referring Provider (PT)  Frederik Pear, MD    Next MD Visit  2 months    Prior Therapy  no      Precautions   Precautions  None      Restrictions   Weight Bearing Restrictions  No                   [facility] Adult PT Treatment/Exercise - 01/04/19 0001      Exercises   Exercises  Knee/Hip      Knee/Hip Exercises: Aerobic   Nustep  L4 x10 min      Knee/Hip Exercises: Standing   Hip Flexion  AROM;Both;15 reps;Knee bent    Hip Abduction  AROM;Both;20 reps;Knee straight    Lateral Step Up  Both;20 reps;Hand Hold: 2;Step Height: 6"    Rocker Board  3 minutes      Knee/Hip Exercises: Seated   Long Arc Quad  Strengthening;Both;20 reps;Weights    Long Arc Quad Weight  4 lbs.      Knee/Hip Exercises:  Supine   Straight Leg Raises  AROM;Both;20 reps    Other Supine Knee/Hip Exercises  B hip clam green theraband x20 reps      Modalities   Modalities  Iontophoresis      Iontophoresis   Type of Iontophoresis  Dexamethasone    Location  R hip    Dose  1.0 ml    Time  8                  PT Long Term Goals - 01/01/19 1055      PT LONG TERM GOAL #1   Title  Patient will be independent with HEP and its progression    Time  6    Period  Weeks    Status  New      PT LONG TERM GOAL #2   Title  Patient will demonstrate 5/5 left knee MMT to improve stability during functional tasks.    Time  6    Period  Weeks    Status  New      PT LONG TERM GOAL #3   Title  Patient will demonstrate 4+/5 or greater right hip MMT to improve stability during funtional tasks.    Time  6    Period  Weeks    Status  New      PT LONG TERM GOAL #4   Title  Patient will report ability to walk with right hip pain less than 3/10.    Time  6    Period  Weeks    Status  New      PT LONG TERM GOAL #5   Title  Patient will report ability to negotiate steps with a reciprocating gait pattern with pain less than 3/10 to safely access basement.     Baseline  6    Period  Weeks    Status  New            Plan - 01/04/19 8676    Clinical Impression Statement  Patient presented in clinic with no current hip/knee pain. Patient progressed through hip/knee strengthening in various positions with no complaints of any increased pain. Patient requested to try iontophoresis even with education regarding his low severity allergy of sulfa derivatives. MD signed off on PT POC from evaluation. Patient provided verbal education and hand out along with VCs to take patch off in 4  hours unless irritation or discomfort experienced. Good overall strength noted during today's therex.    Personal Factors and Comorbidities  Age;Comorbidity 1    Comorbidities  DM    Examination-Activity Limitations  Stairs;Stand     Stability/Clinical Decision Making  Stable/Uncomplicated    Rehab Potential  Good    PT Frequency  2x / week    PT Duration  6 weeks    PT Treatment/Interventions  ADLs/Self Care Home Management;Cryotherapy;Ultrasound;Moist Heat;Iontophoresis 79m/ml Dexamethasone;Electrical Stimulation;Stair training;Gait training;Therapeutic activities;Therapeutic exercise;Balance training;Neuromuscular re-education;Manual techniques;Dry needling;Passive range of motion;Vasopneumatic Device;Taping;Patient/family education    PT Next Visit Plan  Nustep, hip and knee strengthening, modalities PRN for pain relief. Iontophoresis per referral    PT Home Exercise Plan  see patient education section    Consulted and Agree with Plan of Care  Patient       Patient will benefit from skilled therapeutic intervention in order to improve the following deficits and impairments:  Pain, Decreased activity tolerance, Decreased range of motion, Decreased strength, Difficulty walking  Visit Diagnosis: Chronic pain of left knee  Pain in right hip     Problem List Patient Active Problem List   Diagnosis Date Noted  . H/O total shoulder replacement, right 11/01/2018  . Paroxysmal tachycardia (HBaton Rouge 01/11/2017  . Nonrheumatic aortic valve stenosis 01/11/2017  . Type 1 diabetes mellitus without complication (HFreistatt 052/84/1324 . Aortic atherosclerosis (HP[place] 11/23/2016  . HNP (herniated nucleus pulposus), lumbar 10/06/2016  . Acute renal injury (HNuiqs[place] 08/06/2016  . Diarrhea 08/06/2016  . Fever 08/06/2016  . Hyperbilirubinemia 08/06/2016  . Septic shock (H[place]ah[place] 08/06/2016  . Lactic acidosis 08/06/2016  . Type 2 diabetes mellitus with hyperlipidemia (HFair[place] 01/18/2016  . Inguinal hernia 12/10/2015  . Right groin pain 11/30/2015  . Thrombocytopenia (H[place] 07/21/2014  . Bilateral carotid bruits 05/11/2014  . Vitamin D deficiency 09/17/2013  . BPH (benign prostatic hyperplasia) 05/22/2013  . Low serum testosterone level  02/18/2013  . Diabetes type 2, controlled (HNew [place] 01/10/2013  . Essential hypertension, benign 01/10/2013  . OSA (obstructive sleep apnea) 05/16/2012  . Edema 02/21/2012  . At risk for coronary artery disease 03/20/2011  . Obesity 03/20/2011  . WEIGHT GAIN, ABNORMAL 11/09/2010  . Hyperlipidemia 06/22/2010  . ALLERGIC RHINITIS 06/22/2010  . ASTHMA 06/22/2010  . COUGH 06/22/2010    KStandley Brooking PTA 01/04/2019, 9:25 AM  [facility]484 [address]MNett[place] [address]Phone: [phone]  Fax:  [phone] Name: Tony AUSTADMRN: [id number] of Birth: 211/15/1952

## 2019-01-08 ENCOUNTER — Ambulatory Visit: Payer: BLUE CROSS/BLUE SHIELD | Admitting: Physical Therapy

## 2019-01-08 ENCOUNTER — Encounter: Payer: Self-pay | Admitting: Physical Therapy

## 2019-01-08 ENCOUNTER — Other Ambulatory Visit: Payer: Self-pay

## 2019-01-08 DIAGNOSIS — M25551 Pain in right hip: Secondary | ICD-10-CM | POA: Diagnosis not present

## 2019-01-08 DIAGNOSIS — G8929 Other chronic pain: Secondary | ICD-10-CM | POA: Diagnosis not present

## 2019-01-08 DIAGNOSIS — M25562 Pain in left knee: Secondary | ICD-10-CM | POA: Diagnosis not present

## 2019-01-08 NOTE — Therapy (Signed)
[facility] [place], [place], [address] Phone: [phone]   Fax:  [phone]  Physical Therapy Treatment  Patient Details  Name: Tony Cantu MRN: [id number] Date of Birth: [birth year] Referring Provider (PT): Frederik Pear, MD   Encounter Date: 01/08/2019  PT End of Session - 01/08/19 0823    Visit Number  3    Number of Visits  12    Date for PT Re-Evaluation  02/26/19    PT Start Time  0814    PT Stop Time  0909    PT Time Calculation (min)  55 min    Activity Tolerance  Patient tolerated treatment well    Behavior During Therapy  [facility] for tasks assessed/performed       Past Medical History:  Diagnosis Date  . Arthritis   . Asthma   . BPH (benign prostatic hypertrophy)   . Colon polyps   . Diabetes mellitus without complication (HCC)    TYPE 1   . Gastritis   . GERD (gastroesophageal reflux disease)   . Headache    HX  . Hypertension   . PONV (postoperative nausea and vomiting)   . Seizures (HCC)    HYPOGLYCEMIC LAST 1 AND 1/2 YRS AGO  . Sleep apnea    uses CPAP nightly  . Testicle trouble    one testicle BORN WITH    Past Surgical History:  Procedure Laterality Date  . BACK SURGERY     94  LOWER   . CARPAL TUNNEL RELEASE Right 10/13/2015   Procedure: RIGHT CARPAL TUNNEL RELEASE;  Surgeon: Daryll Brod, MD;  Location: [place];  Service: Orthopedics;  Laterality: Right;  . CARPAL TUNNEL RELEASE Left 07/05/2016   Procedure: LEFT CARPAL TUNNEL RELEASE;  Surgeon: Daryll Brod, MD;  Location: [place];  Service: Orthopedics;  Laterality: Left;  . HERNIA REPAIR    . INGUINAL HERNIA REPAIR  2003   right   . KNEE ARTHROSCOPY Right 03/03/2016  . LUMBAR DISC SURGERY  8/96   Dr. Coralyn Mark, discectomy  . LUMBAR LAMINECTOMY/DECOMPRESSION MICRODISCECTOMY Right 10/06/2016   Procedure: RIGHT LUMBAR THREE - LUMBAR FOUR  LAMINECTOMY, FORAMINOTOMY AND MICRODISCECTOMY;  Surgeon: Jovita Gamma, MD;  Location: [place];  Service: Neurosurgery;  Laterality: Right;  . SHOULDER SURGERY  11/28/05   left partial  . SHOULDER SURGERY  07/14/2006   RIGHT  . TONSILLECTOMY  AGE 68 OR 68  . TOTAL SHOULDER ARTHROPLASTY Right 11/01/2018   Procedure: RIGHT SHOULDER REVISION TO REVERSE TOTAL SHOULDER;  Surgeon: Tania Ade, MD;  [facility];  Service: Orthopedics;  Laterality: Right;  CHOICE ANESTHESIA WITH INTERSCALENE BLOCK EXPAREL, NEEDS RNFA    There were no vitals filed for this visit.  Subjective Assessment - 01/08/19 0814    Subjective  COVID 19 screening performed on patient prior to entering building. Reports no adverse reaction to ionto patch. Has not done a lot of walking today.    Pertinent History  DM    Limitations  Standing;Walking;House hold activities    How long can you walk comfortably?  less than 1 mile    Diagnostic tests  x-ray: normal    Patient Stated Goals  get better and return to walking program    Currently in Pain?  No/denies         [facility] PT Assessment - 01/08/19 0001      Assessment   Medical Diagnosis  left knee pain and upper peroneal tendonitis, right  greater trochanter bursitis    Referring Provider (PT)  Frederik Pear, MD    Next MD Visit  2 months    Prior Therapy  no      Precautions   Precautions  None      Restrictions   Weight Bearing Restrictions  No                   [facility] Adult PT Treatment/Exercise - 01/08/19 0001      Knee/Hip Exercises: Aerobic   Nustep  L5 x10 min      Knee/Hip Exercises: Standing   Terminal Knee Extension  Strengthening;Left;20 reps;Theraband    Theraband Level (Terminal Knee Extension)  Level 2 (Red)    Hip Abduction  AROM;Both;20 reps;Knee straight    Hip Extension  AROM;Both;2 sets;10 reps;Knee straight    Lateral Step Up  Both;20 reps;Hand Hold: 2;Step Height: 6"    Step Down  Both;15 reps;Hand Hold: 2;Step Height: 4"   Heel dot   Rocker Board  4 minutes      Knee/Hip Exercises:  Seated   Long Arc Quad  Strengthening;Both;20 reps;Weights    Long Arc Quad Weight  4 lbs.      Knee/Hip Exercises: Supine   Straight Leg Raises  AROM;Both;20 reps    Other Supine Knee/Hip Exercises  B hip clam red theraband x20 reps      Modalities   Modalities  Iontophoresis      Iontophoresis   Type of Iontophoresis  Dexamethasone    Location  R hip    Dose  1.0 ml    Time  8                  PT Long Term Goals - 01/08/19 9509      PT LONG TERM GOAL #1   Title  Patient will be independent with HEP and its progression    Time  6    Period  Weeks    Status  Achieved      PT LONG TERM GOAL #2   Title  Patient will demonstrate 5/5 left knee MMT to improve stability during functional tasks.    Time  6    Period  Weeks    Status  On-going      PT LONG TERM GOAL #3   Title  Patient will demonstrate 4+/5 or greater right hip MMT to improve stability during funtional tasks.    Time  6    Period  Weeks    Status  On-going      PT LONG TERM GOAL #4   Title  Patient will report ability to walk with right hip pain less than 3/10.    Time  6    Period  Weeks    Status  On-going      PT LONG TERM GOAL #5   Title  Patient will report ability to negotiate steps with a reciprocating gait pattern with pain less than 3/10 to safely access basement.     Baseline  6    Period  Weeks    Status  On-going            Plan - 01/08/19 3267    Clinical Impression Statement  Patient presented in clinic with no current R hip/L knee pain. No limitation with palpation of R hip or by laying on R side during sleep. Patient guided through BLE strengthening exercises. Patient limited with heel dots due to discomfort especially with LLE but  lacked proper technique as well even with VCs and demo. Patient able to tolerate TKE better with improved technique. With no adverse response noted with iontophoresis following last treatment, ionto patch donned over R greater trochanter.      Personal Factors and Comorbidities  Age;Comorbidity 1    Comorbidities  DM    Examination-Activity Limitations  Stairs;Stand    Stability/Clinical Decision Making  Stable/Uncomplicated    Rehab Potential  Good    PT Frequency  2x / week    PT Duration  6 weeks    PT Treatment/Interventions  ADLs/Self Care Home Management;Cryotherapy;Ultrasound;Moist Heat;Iontophoresis 91m/ml Dexamethasone;Electrical Stimulation;Stair training;Gait training;Therapeutic activities;Therapeutic exercise;Balance training;Neuromuscular re-education;Manual techniques;Dry needling;Passive range of motion;Vasopneumatic Device;Taping;Patient/family education    PT Next Visit Plan  Nustep, hip and knee strengthening, modalities PRN for pain relief. Iontophoresis per referral    PT Home Exercise Plan  see patient education section    Consulted and Agree with Plan of Care  Patient       Patient will benefit from skilled therapeutic intervention in order to improve the following deficits and impairments:  Pain, Decreased activity tolerance, Decreased range of motion, Decreased strength, Difficulty walking  Visit Diagnosis: Chronic pain of left knee  Pain in right hip     Problem List Patient Active Problem List   Diagnosis Date Noted  . H/O total shoulder replacement, right 11/01/2018  . Paroxysmal tachycardia (HKimberling [place] 01/11/2017  . Nonrheumatic aortic valve stenosis 01/11/2017  . Type 1 diabetes mellitus without complication (H[place] 004/54/0981 . Aortic atherosclerosis (H[place] 11/23/2016  . HNP (herniated nucleus pulposus), lumbar 10/06/2016  . Acute renal injury (HBernal[place] 08/06/2016  . Diarrhea 08/06/2016  . Fever 08/06/2016  . Hyperbilirubinemia 08/06/2016  . Septic shock (HC[place] 08/06/2016  . Lactic acidosis 08/06/2016  . Type 2 diabetes mellitus with hyperlipidemia (HLiber[place] 01/18/2016  . Inguinal hernia 12/10/2015  . Right groin pain 11/30/2015  . Thrombocytopenia (HP[facility] 07/21/2014  . Bilateral carotid bruits  05/11/2014  . Vitamin D deficiency 09/17/2013  . BPH (benign prostatic hyperplasia) 05/22/2013  . 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 Diabetes type 2, controlled (HGarwin 01/10/2013  . Essential hypertension, benign 01/10/2013  . OSA (obstructive sleep apnea) 05/16/2012  . Edema 02/21/2012  . At risk for coronary artery disease 03/20/2011  . Obesity 03/20/2011  . WEIGHT GAIN, ABNORMAL 11/09/2010  . Hyperlipidemia 06/22/2010  . ALLERGIC RHINITIS 06/22/2010  . ASTHMA 06/22/2010  . COUGH 06/22/2010    KStandley Brooking PTA 01/08/2019, 9:28 AM  [facility] [address]Phone: [phone]  Fax:  [phone] Name: WKYPTON ELTRINGHAMMRN: [id number] of Birth: 2March 23, 1952

## 2019-01-11 ENCOUNTER — Encounter: Payer: Self-pay | Admitting: Primary Care

## 2019-01-11 ENCOUNTER — Ambulatory Visit (INDEPENDENT_AMBULATORY_CARE_PROVIDER_SITE_OTHER): Payer: BLUE CROSS/BLUE SHIELD | Admitting: Primary Care

## 2019-01-11 ENCOUNTER — Ambulatory Visit: Payer: BLUE CROSS/BLUE SHIELD | Admitting: Pulmonary Disease

## 2019-01-11 ENCOUNTER — Other Ambulatory Visit: Payer: Self-pay

## 2019-01-11 VITALS — BP 108/64 | HR 69 | Temp 98.0°F | Ht 70.0 in | Wt 251.4 lb

## 2019-01-11 DIAGNOSIS — Z9989 Dependence on other enabling machines and devices: Secondary | ICD-10-CM

## 2019-01-11 DIAGNOSIS — G4733 Obstructive sleep apnea (adult) (pediatric): Secondary | ICD-10-CM | POA: Diagnosis not present

## 2019-01-11 NOTE — Addendum Note (Signed)
Addended by: Karmen Stabs on: 01/11/2019 10:38 AM   Modules accepted: Orders

## 2019-01-11 NOTE — Assessment & Plan Note (Signed)
-   Doing well - 100% compliant with CPAP and reports benefit from use - Pressure setting 5-20cm H20; AHI 2.4  - Due for new machine  - Follow up in 1 year

## 2019-01-11 NOTE — Assessment & Plan Note (Addendum)
-   Stable, no recent exacerbations - Rare use of Albuterol rescue inhaler  - Continue singulair, allegra and flonase

## 2019-01-11 NOTE — Patient Instructions (Signed)
Great work wearing CPAP device  Continue wearing everynight for 4-6 hours  Do not drive if experiencing excessive daytime fatigue or somnolence  Will place order for new machine  Follow up in 1 year or sooner if you have any concerns/issues     Living With Sleep Apnea Sleep apnea is a condition in which breathing pauses or becomes shallow during sleep. Sleep apnea is most commonly caused by a collapsed or blocked airway. People with sleep apnea snore loudly and have times when they gasp and stop breathing for 10 seconds or more during sleep. This happens over and over during the night. This disrupts your sleep and keeps your body from getting the rest that it needs, which can cause tiredness and lack of energy (fatigue) during the day. The breaks in breathing also interrupt the deep sleep that you need to feel rested. Even if you do not completely wake up from the gaps in breathing, your sleep may not be restful. You may also have a headache in the morning and low energy during the day, and you may feel anxious or depressed. How can sleep apnea affect me? Sleep apnea increases your chances of extreme tiredness during the day (daytime fatigue). It can also increase your risk for health conditions, such as:  Heart attack.  Stroke.  Diabetes.  Heart failure.  Irregular heartbeat.  High blood pressure. If you have daytime fatigue as a result of sleep apnea, you may be more likely to:  Perform poorly at school or work.  Fall asleep while driving.  Have difficulty with attention.  Develop depression or anxiety.  Become severely overweight (obese).  Have sexual dysfunction. What actions can I take to manage sleep apnea? Sleep apnea treatment   If you were given a device to open your airway while you sleep, use it only as told by your health care provider. You may be given: ? An oral appliance. This is a custom-made mouthpiece that shifts your lower jaw forward. ? A  continuous positive airway pressure (CPAP) device. This device blows air through a mask when you breathe out (exhale). ? A nasal expiratory positive airway pressure (EPAP) device. This device has valves that you put into each nostril. ? A bi-level positive airway pressure (BPAP) device. This device blows air through a mask when you breathe in (inhale) and breathe out (exhale).  You may need surgery if other treatments do not work for you. Sleep habits  Go to sleep and wake up at the same time every day. This helps set your internal clock (circadian rhythm) for sleeping. ? If you stay up later than usual, such as on weekends, try to get up in the morning within 2 hours of your normal wake time.  Try to get at least 7-9 hours of sleep each night.  Stop computer, tablet, and mobile phone use a few hours before bedtime.  Do not take long naps during the day. If you nap, limit it to 30 minutes.  Have a relaxing bedtime routine. Reading or listening to music may relax you and help you sleep.  Use your bedroom only for sleep. ? Keep your television and computer out of your bedroom. ? Keep your bedroom cool, dark, and quiet. ? Use a supportive mattress and pillows.  Follow your health care provider's instructions for other changes to sleep habits. Nutrition  Do not eat heavy meals in the evening.  Do not have caffeine in the later part of the day. The effects of  caffeine can last for more than 5 hours.  Follow your health care provider's or dietitian's instructions for any diet changes. Lifestyle      Do not drink alcohol before bedtime. Alcohol can cause you to fall asleep at first, but then it can cause you to wake up in the middle of the night and have trouble getting back to sleep.  Do not use any products that contain nicotine or tobacco, such as cigarettes and e-cigarettes. If you need help quitting, ask your health care provider. Medicines  Take over-the-counter and  prescription medicines only as told by your health care provider.  Do not use over-the-counter sleep medicine. You can become dependent on this medicine, and it can make sleep apnea worse.  Do not use medicines, such as sedatives and narcotics, unless told by your health care provider. Activity  Exercise on most days, but avoid exercising in the evening. Exercising near bedtime can interfere with sleeping.  If possible, spend time outside every day. Natural light helps regulate your circadian rhythm. General information  Lose weight if you need to, and maintain a healthy weight.  Keep all follow-up visits as told by your health care provider. This is important.  If you are having surgery, make sure to tell your health care provider that you have sleep apnea. You may need to bring your device with you. Where to find more information Learn more about sleep apnea and daytime fatigue from:  American Sleep Association: sleepassociation.[place]: sleepfoundation.org  National Heart, Lung, and Blood Institute: https://www.hartman-hill.biz/ Summary  Sleep apnea can cause daytime fatigue and other serious health conditions.  Both sleep apnea and daytime fatigue can be bad for your health and well-being.  You may need to wear a device while sleeping to help keep your airway open.  If you are having surgery, make sure to tell your health care provider that you have sleep apnea. You may need to bring your device with you.  Making changes to sleep habits, diet, lifestyle, and activity can help you manage sleep apnea. This information is not intended to replace advice given to you by your health care provider. Make sure you discuss any questions you have with your health care provider. Document Released: 11/09/2017 Document Revised: 04/17/2018 Document Reviewed: 11/09/2017 Elsevier Interactive Patient Education  [employer].

## 2019-01-11 NOTE — Progress Notes (Signed)
@Patient  ID: [name], male    DOB: [birth year], 68 y.o.   MRN: [id number]  Chief Complaint  Patient presents with  . Follow-up    cpap follow up    Referring provider: Chipper Herb, MD  HPI: 68 year old male, former smoker (34 pack year hx). PMH significant for OSA, allergic rhinitis, asthma, HTN, paroxysmal tachycardia, CAD. Patient of Dr. Halford Chessman, last seen on 01/23/18. NPSG in 2013 showed AHI 76/hr. On CPAP for sleep apnea.   01/11/2019 Patient is doing well, no issues. He is 100% compliant with CPAP and reports benefit from use. States that he is due for a new machine. He has been having issues with pressure, feels it is not as strong compared to newer versions. He has not had to use his rescue inhaler.   [facility]: Usage 89/90 days; 99% > 4 hours Pressure 5-20cm H20 AHI 2.4  Testing reviewed: Pulmonary tests: PFT 02/09/16 >> FEV1 2.38 (70%), FEV1% 84, TLC 4.96 (70%), DLCO 78% Sleep tests PSG 05/02/12 >> AHI 76 Auto CPAP 10/25/17 to 01/22/18 >> used on 90 of 90 nights with average 7 hrs 18 min.  Average AHI 1 with median CPAP 19 and 95 th percentile CPAP 20 cm H2O Cardiac tests Echo 08/09/16 >> EF 60 to 65%  Allergies  Allergen Reactions  . Erythromycin Diarrhea  . Sulfonamide Derivatives Diarrhea    Immunization History  Administered Date(s) Administered  . Influenza Split 06/05/2013  . Influenza Whole 06/15/2010, 06/16/2011, 05/29/2012  . Influenza, High Dose Seasonal PF 06/25/2015, 06/12/2017  . Influenza,inj,Quad PF,6+ Mos 06/17/2014, 06/07/2016  . Influenza-Unspecified 05/22/2018  . Pneumococcal Conjugate-13 06/05/2013  . Pneumococcal Polysaccharide-23 06/03/2011  . Tdap 08/30/2007, 12/27/2017    Past Medical History:  Diagnosis Date  . Arthritis   . Asthma   . BPH (benign prostatic hypertrophy)   . Colon polyps   . Diabetes mellitus without complication (HCC)    TYPE 1   . Gastritis   . GERD (gastroesophageal reflux disease)   . Headache    HX  .  Hypertension   . PONV (postoperative nausea and vomiting)   . Seizures (HCC)    HYPOGLYCEMIC LAST 1 AND 1/2 YRS AGO  . Sleep apnea    uses CPAP nightly  . Testicle trouble    one testicle BORN WITH    Tobacco History: Social History   Tobacco Use  Smoking Status Former Smoker  . Years: 34.00  . Types: Cigarettes, Pipe  Smokeless Tobacco Never Used  Tobacco Comment   quit 2005 smoked cigarettes for 5 yrs prior to pipe use   Counseling given: Not Answered Comment: quit 2005 smoked cigarettes for 5 yrs prior to pipe use   Outpatient Medications Prior to Visit  Medication Sig Dispense Refill  . albuterol (PROVENTIL HFA;VENTOLIN HFA) 108 (90 Base) MCG/ACT inhaler Inhale 2 puffs into the lungs every 6 (six) hours as needed for wheezing or shortness of breath. 3 Inhaler 1  . aspirin EC 325 MG EC tablet Take 1 tablet (325 mg total) by mouth 2 (two) times daily. 30 tablet 0  . atorvastatin (LIPITOR) 40 MG tablet TAKE 1 TABLET BY MOUTH  DAILY 90 tablet 0  . b complex vitamins tablet Take 1 tablet by mouth daily at 12 noon.     . Calcium Carbonate-Vitamin D 600-400 MG-UNIT tablet Take 1 tablet by mouth daily.    . Cholecalciferol (VITAMIN D3) 2000 UNITS capsule Take 2,000 Units by mouth daily at 12 noon.     Marland Kitchen  Continuous Blood Gluc Receiver (FREESTYLE LIBRE READER) DEVI 1 applicator by Does not apply route as directed. 1 Device 1  . Continuous Blood Gluc Sensor (FREESTYLE LIBRE SENSOR SYSTEM) MISC Check BS eight (8) times a day. Dx E10.9 3 each 3  . fexofenadine (ALLEGRA) 180 MG tablet Take 1 tablet (180 mg total) by mouth daily with breakfast. 90 tablet 3  . Flaxseed, Linseed, (FLAX SEED OIL PO) Take 1,300 mg by mouth daily with breakfast.     . furosemide (LASIX) 20 MG tablet TAKE 1 AND 1/2 TABLETS BY  MOUTH DAILY 135 tablet 0  . GLUCAGON EMERGENCY 1 MG injection INJECT 1MG INTO SKIN ONCE  AS NEEDED (Patient taking differently: Inject 1 mg into the skin once as needed (low blood sugar).  ) 3 each 1  . Glucosamine-Chondroit-Vit C-Mn (GLUCOSAMINE 1500 COMPLEX) CAPS Take 2 capsules by mouth daily at 12 noon.     . meloxicam (MOBIC) 15 MG tablet     . montelukast (SINGULAIR) 10 MG tablet TAKE 1 TABLET BY MOUTH AT  BEDTIME 90 tablet 0  . multivitamin-lutein (OCUVITE-LUTEIN) CAPS capsule Take 1 capsule by mouth daily at 12 noon.    . pantoprazole (PROTONIX) 40 MG tablet TAKE 1 TABLET BY MOUTH  DAILY 90 tablet 0  . Testosterone 30 MG/ACT SOLN USE 1 PUMP UNDER EACH  AXILLA DAILY (TOTAL OF 60MG DAILY) 270 mL 0  . TOUJEO MAX SOLOSTAR 300 UNIT/ML SOPN INJECT SUBCUTANEOUSLY 120  UNITS DAILY 30 mL 0  . UNABLE TO FIND CAMPHOPHENIQUE QHS TO AREA BRELOW NOSE    . valsartan (DIOVAN) 160 MG tablet TAKE 1 TABLET BY MOUTH  DAILY 90 tablet 0  . azithromycin (ZITHROMAX) 250 MG tablet As directed 6 tablet 0  . HUMALOG 100 UNIT/ML injection INJECT 20 TO 50 UNITS  SUBCUTANEOUSLY 3 TIMES  DAILY BEFORE MEALS PER SLIDING SCALE (Patient taking differently: Inject 2-30 Units into the skin every 2 (two) hours. ) 140 mL 2  . methocarbamol (ROBAXIN) 500 MG tablet Take 1 tablet (500 mg total) by mouth 3 (three) times daily as needed for muscle spasms. (Patient not taking: Reported on 01/11/2019) 30 tablet 0  . mupirocin ointment (BACTROBAN) 2 % Apply 1 application topically 2 (two) times daily. (Patient not taking: Reported on 01/11/2019) 22 g 0  . oxyCODONE-acetaminophen (PERCOCET) 5-325 MG tablet Take 1 tablet by mouth every 4 (four) hours as needed for severe pain. (Patient not taking: Reported on 01/11/2019) 30 tablet 0  . sildenafil (REVATIO) 20 MG tablet TAKE 2-5 TABLETS AS NEEDED PRIOR TO SEXUAL ACTIVITY (Patient not taking: Reported on 01/11/2019) 50 tablet 1  . triamcinolone acetonide 40 MG/ML SUSP 40 mg, mupirocin cream 2 % CREA 15 application Apply 1 application topically 2 (two) times daily. (Patient not taking: Reported on 01/11/2019) 30 g 1   No facility-administered medications prior to visit.      Review of Systems  Review of Systems  Constitutional: Negative.   Respiratory: Negative.   Cardiovascular: Negative.    Physical Exam  BP 108/64 (BP Location: Left Arm, Cuff Size: Normal)   Pulse 69   Temp 98 F (36.7 C)   Ht 5' 10"  (1.778 m)   Wt 251 lb 6.4 oz (114 kg)   SpO2 96%   BMI 36.07 kg/m  Physical Exam Constitutional:      Appearance: Normal appearance. He is well-developed.  HENT:     Head: Normocephalic and atraumatic.     Nose: Nose normal.  Mouth/Throat:     Mouth: Mucous membranes are moist.     Pharynx: Oropharynx is clear.     Comments: Mallampati class III Eyes:     Pupils: Pupils are equal, round, and reactive to light.  Neck:     Musculoskeletal: Normal range of motion and neck supple.  Cardiovascular:     Rate and Rhythm: Normal rate and regular rhythm.     Heart sounds: Normal heart sounds.  Pulmonary:     Effort: Pulmonary effort is normal. No respiratory distress.     Breath sounds: Normal breath sounds. No wheezing.     Comments: CTA Musculoskeletal: Normal range of motion.  Skin:    General: Skin is warm and dry.     Findings: No erythema or rash.  Neurological:     General: No focal deficit present.     Mental Status: He is alert and oriented to person, place, and time. Mental status is at baseline.  Psychiatric:        Mood and Affect: Mood normal.        Behavior: Behavior normal.        Thought Content: Thought content normal.        Judgment: Judgment normal.      Lab Results:  CBC    Component Value Date/Time   WBC 11.3 (H) 11/02/2018 0457   RBC 4.43 11/02/2018 0457   HGB 14.3 11/02/2018 0457   HGB 15.7 09/11/2018 1114   HCT 46.2 11/02/2018 0457   HCT 46.9 09/11/2018 1114   PLT 118 (L) 11/02/2018 0457   PLT 156 09/11/2018 1114   MCV 104.3 (H) 11/02/2018 0457   MCV 96 09/11/2018 1114   MCH 32.3 11/02/2018 0457   MCHC 31.0 11/02/2018 0457   RDW 15.2 11/02/2018 0457   RDW 13.6 09/11/2018 1114   LYMPHSABS 1.8  10/25/2018 1000   LYMPHSABS 1.7 09/11/2018 1114   MONOABS 1.2 (H) 10/25/2018 1000   EOSABS 0.2 10/25/2018 1000   EOSABS 0.2 09/11/2018 1114   BASOSABS 0.1 10/25/2018 1000   BASOSABS 0.1 09/11/2018 1114    BMET    Component Value Date/Time   NA 139 11/02/2018 0457   NA 138 09/11/2018 1114   K 4.1 11/02/2018 0457   CL 107 11/02/2018 0457   CO2 26 11/02/2018 0457   GLUCOSE 132 (H) 11/02/2018 0457   BUN 16 11/02/2018 0457   BUN 14 09/11/2018 1114   CREATININE 0.94 11/02/2018 0457   CREATININE 0.82 01/07/2013 0840   CALCIUM 8.7 (L) 11/02/2018 0457   GFRNONAA >60 11/02/2018 0457   GFRNONAA >89 01/07/2013 0840   GFRAA >60 11/02/2018 0457   GFRAA >89 01/07/2013 0840    BNP    Component Value Date/Time   BNP 10.6 04/21/2016 1148    ProBNP    Component Value Date/Time   PROBNP 30.0 02/21/2012 1008    Imaging: No results found.   Assessment & Plan:   OSA (obstructive sleep apnea) - Doing well - 100% compliant with CPAP and reports benefit from use - Pressure setting 5-20cm H20; AHI 2.4  - Due for new machine  - Follow up in 1 year   ASTHMA - Stable, no recent exacerbations - Rare use of Albuterol rescue inhaler  - Continue singulair, allegra and flonase    Martyn Ehrich, NP 01/11/2019

## 2019-01-15 ENCOUNTER — Ambulatory Visit: Payer: BLUE CROSS/BLUE SHIELD | Admitting: Physical Therapy

## 2019-01-15 ENCOUNTER — Other Ambulatory Visit: Payer: BLUE CROSS/BLUE SHIELD

## 2019-01-15 ENCOUNTER — Other Ambulatory Visit: Payer: Self-pay

## 2019-01-15 DIAGNOSIS — I10 Essential (primary) hypertension: Secondary | ICD-10-CM | POA: Diagnosis not present

## 2019-01-15 DIAGNOSIS — E349 Endocrine disorder, unspecified: Secondary | ICD-10-CM | POA: Diagnosis not present

## 2019-01-15 DIAGNOSIS — N400 Enlarged prostate without lower urinary tract symptoms: Secondary | ICD-10-CM | POA: Diagnosis not present

## 2019-01-15 DIAGNOSIS — D696 Thrombocytopenia, unspecified: Secondary | ICD-10-CM

## 2019-01-15 DIAGNOSIS — E559 Vitamin D deficiency, unspecified: Secondary | ICD-10-CM | POA: Diagnosis not present

## 2019-01-15 DIAGNOSIS — G8929 Other chronic pain: Secondary | ICD-10-CM

## 2019-01-15 DIAGNOSIS — E10649 Type 1 diabetes mellitus with hypoglycemia without coma: Secondary | ICD-10-CM

## 2019-01-15 DIAGNOSIS — M25551 Pain in right hip: Secondary | ICD-10-CM

## 2019-01-15 DIAGNOSIS — M25562 Pain in left knee: Secondary | ICD-10-CM

## 2019-01-15 DIAGNOSIS — E7800 Pure hypercholesterolemia, unspecified: Secondary | ICD-10-CM | POA: Diagnosis not present

## 2019-01-15 LAB — BAYER DCA HB A1C WAIVED: HB A1C (BAYER DCA - WAIVED): 5.1 % (ref <7.0)

## 2019-01-15 NOTE — Therapy (Signed)
[facility] [place], [place], [address] Phone: [phone]   Fax:  [phone]  Physical Therapy Treatment  Patient Details  Name: Tony Cantu MRN: [id number] Date of Birth: 04/30/1951 Referring Provider (PT): Frederik Pear, MD   Encounter Date: 01/15/2019  PT End of Session - 01/15/19 0943    Visit Number  4    Number of Visits  12    Date for PT Re-Evaluation  02/26/19    PT Start Time  0939    PT Stop Time  1026    PT Time Calculation (min)  47 min    Activity Tolerance  Patient tolerated treatment well    Behavior During Therapy  [facility] for tasks assessed/performed       Past Medical History:  Diagnosis Date  . Arthritis   . Asthma   . BPH (benign prostatic hypertrophy)   . Colon polyps   . Diabetes mellitus without complication (HCC)    TYPE 1   . Gastritis   . GERD (gastroesophageal reflux disease)   . Headache    HX  . Hypertension   . PONV (postoperative nausea and vomiting)   . Seizures (HCC)    HYPOGLYCEMIC LAST 1 AND 1/2 YRS AGO  . Sleep apnea    uses CPAP nightly  . Testicle trouble    one testicle BORN WITH    Past Surgical History:  Procedure Laterality Date  . BACK SURGERY     89  LOWER   . CARPAL TUNNEL RELEASE Right 10/13/2015   Procedure: RIGHT CARPAL TUNNEL RELEASE;  Surgeon: Daryll Brod, MD;  Location: [place];  Service: Orthopedics;  Laterality: Right;  . CARPAL TUNNEL RELEASE Left 07/05/2016   Procedure: LEFT CARPAL TUNNEL RELEASE;  Surgeon: Daryll Brod, MD;  Location: [place];  Service: Orthopedics;  Laterality: Left;  . HERNIA REPAIR    . INGUINAL HERNIA REPAIR  2003   right   . KNEE ARTHROSCOPY Right 03/03/2016  . LUMBAR DISC SURGERY  8/96   Dr. Coralyn Mark, discectomy  . LUMBAR LAMINECTOMY/DECOMPRESSION MICRODISCECTOMY Right 10/06/2016   Procedure: RIGHT LUMBAR THREE - LUMBAR FOUR  LAMINECTOMY, FORAMINOTOMY AND MICRODISCECTOMY;  Surgeon: Jovita Gamma, MD;  Location: [facility];  Service: Neurosurgery;  Laterality: Right;  . SHOULDER SURGERY  11/28/05   left partial  . SHOULDER SURGERY  07/14/2006   RIGHT  . TONSILLECTOMY  AGE 62 OR 4  . TOTAL SHOULDER ARTHROPLASTY Right 11/01/2018   Procedure: RIGHT SHOULDER REVISION TO REVERSE TOTAL SHOULDER;  Surgeon: Tania Ade, MD;  Location: [facility];  Service: Orthopedics;  Laterality: Right;  CHOICE ANESTHESIA WITH INTERSCALENE BLOCK EXPAREL, NEEDS RNFA    There were no vitals filed for this visit.  Subjective Assessment - 01/15/19 0943    Subjective  COVID 19 screening performed on patient prior to entering building. Patient reports his hip was a little painful yesterday but reports it was more in the anterior aspect of the hip than the lateral aspect.    Pertinent History  DM    Limitations  Standing;Walking;House hold activities    How long can you walk comfortably?  less than 1 mile    Diagnostic tests  x-ray: normal    Patient Stated Goals  get better and return to walking program    Currently in Pain?  No/denies         [facility] PT Assessment - 01/15/19 0001      Assessment   Medical  Diagnosis  left knee pain and upper peroneal tendonitis, right greater trochanter bursitis    Referring Provider (PT)  Frederik Pear, MD    Next MD Visit  2 months    Prior Therapy  no      Precautions   Precautions  None                   [facility] Adult PT Treatment/Exercise - 01/15/19 0001      Knee/Hip Exercises: Stretches   Piriformis Stretch  --    Piriformis Stretch Limitations  --    Other Knee/Hip Stretches  single knee to chest stretch right 2 x30 seconds      Knee/Hip Exercises: Aerobic   Nustep  L5 x10 min no UEs      Knee/Hip Exercises: Machines for Strengthening   Cybex Knee Extension  10# 2x10    Cybex Knee Flexion  20# 2x10      Knee/Hip Exercises: Standing   Hip Abduction  AROM;Both;20 reps;Knee straight    Rocker Board  4 minutes      Knee/Hip Exercises:  Seated   Other Seated Knee/Hip Exercises  seated ankle eversion with band x20       Knee/Hip Exercises: Supine   Straight Leg Raises  AROM;Both;20 reps    Straight Leg Raise with External Rotation  AROM;Both;20 reps    Other Supine Knee/Hip Exercises  B hip clam red theraband 5" hold x20 reps    Other Supine Knee/Hip Exercises  hip adduction squeeze 5" hold x20                   PT Long Term Goals - 01/08/19 1696      PT LONG TERM GOAL #1   Title  Patient will be independent with HEP and its progression    Time  6    Period  Weeks    Status  Achieved      PT LONG TERM GOAL #2   Title  Patient will demonstrate 5/5 left knee MMT to improve stability during functional tasks.    Time  6    Period  Weeks    Status  On-going      PT LONG TERM GOAL #3   Title  Patient will demonstrate 4+/5 or greater right hip MMT to improve stability during funtional tasks.    Time  6    Period  Weeks    Status  On-going      PT LONG TERM GOAL #4   Title  Patient will report ability to walk with right hip pain less than 3/10.    Time  6    Period  Weeks    Status  On-going      PT LONG TERM GOAL #5   Title  Patient will report ability to negotiate steps with a reciprocating gait pattern with pain less than 3/10 to safely access basement.     Baseline  6    Period  Weeks    Status  On-going            Plan - 01/15/19 1042    Clinical Impression Statement  Patient was able to tolerate progression of treatment well. Patient noted with increased soreness in medial quads and groin area while perfroming knee extension machine but noted it was not painful. Patient demonstrated good form with all exercises. Patient educated on peroneals and importance of strengthening while lengthening. Patient reported understanding.     Personal Factors and  Comorbidities  Age;Comorbidity 1    Comorbidities  DM    Examination-Activity Limitations  Stairs;Stand    Stability/Clinical Decision Making   Stable/Uncomplicated    Clinical Decision Making  Low    Rehab Potential  Good    PT Frequency  2x / week    PT Duration  6 weeks    PT Treatment/Interventions  ADLs/Self Care Home Management;Cryotherapy;Ultrasound;Moist Heat;Iontophoresis 61m/ml Dexamethasone;Electrical Stimulation;Stair training;Gait training;Therapeutic activities;Therapeutic exercise;Balance training;Neuromuscular re-education;Manual techniques;Dry needling;Passive range of motion;Vasopneumatic Device;Taping;Patient/family education    PT Next Visit Plan  Nustep, hip and knee strengthening, modalities PRN for pain relief. Iontophoresis per referral    PT Home Exercise Plan  see patient education section    Consulted and Agree with Plan of Care  Patient       Patient will benefit from skilled therapeutic intervention in order to improve the following deficits and impairments:  Pain, Decreased activity tolerance, Decreased range of motion, Decreased strength, Difficulty walking  Visit Diagnosis: Chronic pain of left knee  Pain in right hip     Problem List Patient Active Problem List   Diagnosis Date Noted  . H/O total shoulder replacement, right 11/01/2018  . Paroxysmal tachycardia ([place] 01/11/2017  . Nonrheumatic aortic valve stenosis 01/11/2017  . Type 1 diabetes mellitus without complication ([facility] 082/64/1583 . Aortic atherosclerosis ([facility] 11/23/2016  . HNP (herniated nucleus pulposus), lumbar 10/06/2016  . Acute renal injury ([facility] 08/06/2016  . Diarrhea 08/06/2016  . Fever 08/06/2016  . Hyperbilirubinemia 08/06/2016  . Septic shock ([facility] 08/06/2016  . Lactic acidosis 08/06/2016  . Type 2 diabetes mellitus with hyperlipidemia ([facility] 01/18/2016  . Inguinal hernia 12/10/2015  . Right groin pain 11/30/2015  . Thrombocytopenia ([facility] [place] 07/21/2014  . Bilateral carotid bruits 05/11/2014  . Vitamin D deficiency 09/17/2013  . BPH (benign prostatic hyperplasia) 05/22/2013  . Low serum testosterone level  02/18/2013  . Diabetes type 2, controlled ([facility] 01/10/2013  . Essential hypertension, benign 01/10/2013  . OSA (obstructive sleep apnea) 05/16/2012  . Edema 02/21/2012  . At risk for coronary artery disease 03/20/2011  . Obesity 03/20/2011  . WEIGHT GAIN, ABNORMAL 11/09/2010  . Hyperlipidemia 06/22/2010  . ALLERGIC RHINITIS 06/22/2010  . ASTHMA 06/22/2010  . COUGH 06/22/2010   KGabriela Eves PT, DPT 01/15/2019, 10:50 AM  [facility] [place] [address]Phone: [phone]  Fax:  [phone] Name: Tony SWOBODAMRN: [id number] of Birth: [birth year]

## 2019-01-16 ENCOUNTER — Other Ambulatory Visit: Payer: Self-pay | Admitting: *Deleted

## 2019-01-16 ENCOUNTER — Other Ambulatory Visit: Payer: BLUE CROSS/BLUE SHIELD

## 2019-01-16 ENCOUNTER — Ambulatory Visit: Payer: BLUE CROSS/BLUE SHIELD | Admitting: Cardiology

## 2019-01-16 DIAGNOSIS — E875 Hyperkalemia: Secondary | ICD-10-CM | POA: Diagnosis not present

## 2019-01-17 LAB — BMP8+EGFR
BUN/Creatinine Ratio: 13 (ref 10–24)
BUN: 13 mg/dL (ref 8–27)
CO2: 23 mmol/L (ref 20–29)
Calcium: 9.8 mg/dL (ref 8.6–10.2)
Chloride: 100 mmol/L (ref 96–106)
Creatinine, Ser: 1.03 mg/dL (ref 0.76–1.27)
GFR calc Af Amer: 85 mL/min/{1.73_m2} (ref >59)
GFR calc non Af Amer: 74 mL/min/{1.73_m2} (ref >59)
Glucose: 114 mg/dL — ABNORMAL HIGH (ref 65–99)
Potassium: 4.6 mmol/L (ref 3.5–5.2)
Sodium: 140 mmol/L (ref 134–144)

## 2019-01-18 ENCOUNTER — Other Ambulatory Visit: Payer: Self-pay

## 2019-01-18 ENCOUNTER — Encounter: Payer: Self-pay | Admitting: Physical Therapy

## 2019-01-18 ENCOUNTER — Ambulatory Visit: Payer: BLUE CROSS/BLUE SHIELD | Admitting: Physical Therapy

## 2019-01-18 DIAGNOSIS — G8929 Other chronic pain: Secondary | ICD-10-CM | POA: Diagnosis not present

## 2019-01-18 DIAGNOSIS — M25551 Pain in right hip: Secondary | ICD-10-CM | POA: Diagnosis not present

## 2019-01-18 DIAGNOSIS — M25562 Pain in left knee: Secondary | ICD-10-CM | POA: Diagnosis not present

## 2019-01-18 LAB — HEPATIC FUNCTION PANEL
ALT: 45 IU/L — ABNORMAL HIGH (ref 0–44)
AST: 52 IU/L — ABNORMAL HIGH (ref 0–40)
Albumin: 4.2 g/dL (ref 3.8–4.8)
Alkaline Phosphatase: 65 IU/L (ref 39–117)
Bilirubin Total: 0.7 mg/dL (ref 0.0–1.2)
Bilirubin, Direct: 0.22 mg/dL (ref 0.00–0.40)
Total Protein: 7 g/dL (ref 6.0–8.5)

## 2019-01-18 LAB — LIPID PANEL
Chol/HDL Ratio: 2 ratio (ref 0.0–5.0)
Cholesterol, Total: 106 mg/dL (ref 100–199)
HDL: 52 mg/dL (ref >39)
LDL Calculated: 45 mg/dL (ref 0–99)
Triglycerides: 45 mg/dL (ref 0–149)
VLDL Cholesterol Cal: 9 mg/dL (ref 5–40)

## 2019-01-18 LAB — CBC WITH DIFFERENTIAL/PLATELET
Basophils Absolute: 0.1 10*3/uL (ref 0.0–0.2)
Basos: 1 %
EOS (ABSOLUTE): 0.2 10*3/uL (ref 0.0–0.4)
Eos: 4 %
Hematocrit: 47.3 % (ref 37.5–51.0)
Hemoglobin: 15.7 g/dL (ref 13.0–17.7)
Immature Grans (Abs): 0 10*3/uL (ref 0.0–0.1)
Immature Granulocytes: 0 %
Lymphocytes Absolute: 1.5 10*3/uL (ref 0.7–3.1)
Lymphs: 25 %
MCH: 33.1 pg — ABNORMAL HIGH (ref 26.6–33.0)
MCHC: 33.2 g/dL (ref 31.5–35.7)
MCV: 100 fL — ABNORMAL HIGH (ref 79–97)
Monocytes Absolute: 1 10*3/uL — ABNORMAL HIGH (ref 0.1–0.9)
Monocytes: 17 %
Neutrophils Absolute: 3.2 10*3/uL (ref 1.4–7.0)
Neutrophils: 53 %
Platelets: 170 10*3/uL (ref 150–450)
RBC: 4.75 x10E6/uL (ref 4.14–5.80)
RDW: 13.4 % (ref 11.6–15.4)
WBC: 6 10*3/uL (ref 3.4–10.8)

## 2019-01-18 LAB — BMP8+EGFR
BUN/Creatinine Ratio: 15 (ref 10–24)
BUN: 14 mg/dL (ref 8–27)
CO2: 20 mmol/L (ref 20–29)
Calcium: 9.5 mg/dL (ref 8.6–10.2)
Chloride: 103 mmol/L (ref 96–106)
Creatinine, Ser: 0.94 mg/dL (ref 0.76–1.27)
GFR calc Af Amer: 95 mL/min/{1.73_m2} (ref >59)
GFR calc non Af Amer: 82 mL/min/{1.73_m2} (ref >59)
Glucose: 83 mg/dL (ref 65–99)
Potassium: 5.9 mmol/L (ref 3.5–5.2)
Sodium: 139 mmol/L (ref 134–144)

## 2019-01-18 LAB — PSA, TOTAL AND FREE
PSA, Free Pct: 31.7 %
PSA, Free: 0.38 ng/mL
Prostate Specific Ag, Serum: 1.2 ng/mL (ref 0.0–4.0)

## 2019-01-18 LAB — TESTOSTERONE,FREE AND TOTAL
Testosterone, Free: 10 pg/mL (ref 6.6–18.1)
Testosterone: 775 ng/dL (ref 264–916)

## 2019-01-18 LAB — VITAMIN D 25 HYDROXY (VIT D DEFICIENCY, FRACTURES): Vit D, 25-Hydroxy: 68.3 ng/mL (ref 30.0–100.0)

## 2019-01-18 NOTE — Therapy (Signed)
[facility] [place], [place], [address] Phone: [phone]   Fax:  [phone]  Physical Therapy Treatment  Patient Details  Name: Tony Cantu MRN: [id number] Date of Birth: 1951/04/22 Referring Provider (PT): Frederik Pear, MD   Encounter Date: 01/18/2019  PT End of Session - 01/18/19 0823    Visit Number  5    Number of Visits  12    Date for PT Re-Evaluation  02/26/19    PT Start Time  0813    PT Stop Time  0906    PT Time Calculation (min)  53 min    Activity Tolerance  Patient tolerated treatment well    Behavior During Therapy  [facility] for tasks assessed/performed       Past Medical History:  Diagnosis Date  . Arthritis   . Asthma   . BPH (benign prostatic hypertrophy)   . Colon polyps   . Diabetes mellitus without complication (HCC)    TYPE 1   . Gastritis   . GERD (gastroesophageal reflux disease)   . Headache    HX  . Hypertension   . PONV (postoperative nausea and vomiting)   . Seizures (HCC)    HYPOGLYCEMIC LAST 1 AND 1/2 YRS AGO  . Sleep apnea    uses CPAP nightly  . Testicle trouble    one testicle BORN WITH    Past Surgical History:  Procedure Laterality Date  . BACK SURGERY     94  LOWER   . CARPAL TUNNEL RELEASE Right 10/13/2015   Procedure: RIGHT CARPAL TUNNEL RELEASE;  Surgeon: Daryll Brod, MD;  Location: [place];  Service: Orthopedics;  Laterality: Right;  . CARPAL TUNNEL RELEASE Left 07/05/2016   Procedure: LEFT CARPAL TUNNEL RELEASE;  Surgeon: Daryll Brod, MD;  Location: [place];  Service: Orthopedics;  Laterality: Left;  . HERNIA REPAIR    . INGUINAL HERNIA REPAIR  2003   right   . KNEE ARTHROSCOPY Right 03/03/2016  . LUMBAR DISC SURGERY  8/96   Dr. Coralyn Mark, discectomy  . LUMBAR LAMINECTOMY/DECOMPRESSION MICRODISCECTOMY Right 10/06/2016   Procedure: RIGHT LUMBAR THREE - LUMBAR FOUR  LAMINECTOMY, FORAMINOTOMY AND MICRODISCECTOMY;  Surgeon: Jovita Gamma, MD;  Location: [place];  Service: Neurosurgery;  Laterality: Right;  . SHOULDER SURGERY  11/28/05   left partial  . SHOULDER SURGERY  07/14/2006   RIGHT  . TONSILLECTOMY  AGE 68 OR 4  . TOTAL SHOULDER ARTHROPLASTY Right 11/01/2018   Procedure: RIGHT SHOULDER REVISION TO REVERSE TOTAL SHOULDER;  Surgeon: Tania Ade, MD;  Location: [facility];  Service: Orthopedics;  Laterality: Right;  CHOICE ANESTHESIA WITH INTERSCALENE BLOCK EXPAREL, NEEDS RNFA    There were no vitals filed for this visit.  Subjective Assessment - 01/18/19 0821    Subjective  COVID 19 screening performed on patient prior to entering building. Low level discomfort in his knee following the Nustep.    Pertinent History  DM    Limitations  Standing;Walking;House hold activities    How long can you walk comfortably?  less than 1 mile    Diagnostic tests  x-ray: normal    Patient Stated Goals  get better and return to walking program    Currently in Pain?  Yes    Pain Score  1     Pain Location  Knee    Pain Orientation  Left    Pain Descriptors / Indicators  Discomfort    Pain Type  Acute pain  Pain Onset  More than a month ago    Pain Frequency  Intermittent         [facility] PT Assessment - 01/18/19 0001      Assessment   Medical Diagnosis  left knee pain and upper peroneal tendonitis, right greater trochanter bursitis    Referring Provider (PT)  Frederik Pear, MD    Next MD Visit  2 months    Prior Therapy  no      Precautions   Precautions  None                   [facility] Adult PT Treatment/Exercise - 01/18/19 0001      Knee/Hip Exercises: Aerobic   Nustep  L5 x10 min no UEs      Knee/Hip Exercises: Machines for Strengthening   Cybex Knee Extension  10# 3x10 reps    Cybex Knee Flexion  30# 3x10 reps      Knee/Hip Exercises: Standing   Terminal Knee Extension  Strengthening;Left;2 sets;10 reps;Theraband    Theraband Level (Terminal Knee Extension)  Level 3 (Green)    Hip Abduction   AROM;Both;20 reps;Knee straight    Functional Squat  20 reps    Rocker Board  3 minutes      Knee/Hip Exercises: Seated   Clamshell with TheraBand  Green   x30 reps     Knee/Hip Exercises: Supine   Hip Adduction Isometric  Strengthening;Both;3 sets;10 reps    Straight Leg Raises  AROM;Both;20 reps      Modalities   Modalities  Iontophoresis      Iontophoresis   Type of Iontophoresis  Dexamethasone    Location  R hip    Dose  1.0 ml    Time  8                  PT Long Term Goals - 01/08/19 0034      PT LONG TERM GOAL #1   Title  Patient will be independent with HEP and its progression    Time  6    Period  Weeks    Status  Achieved      PT LONG TERM GOAL #2   Title  Patient will demonstrate 5/5 left knee MMT to improve stability during functional tasks.    Time  6    Period  Weeks    Status  On-going      PT LONG TERM GOAL #3   Title  Patient will demonstrate 4+/5 or greater right hip MMT to improve stability during funtional tasks.    Time  6    Period  Weeks    Status  On-going      PT LONG TERM GOAL #4   Title  Patient will report ability to walk with right hip pain less than 3/10.    Time  6    Period  Weeks    Status  On-going      PT LONG TERM GOAL #5   Title  Patient will report ability to negotiate steps with a reciprocating gait pattern with pain less than 3/10 to safely access basement.     Baseline  6    Period  Weeks    Status  On-going            Plan - 01/18/19 9179    Clinical Impression Statement  Patient presented in clinic with continued hip/knee strengthening with only minimal complaint of L knee pain after Nustep. Moderate VCs  and demo required during today's treatment to improve technique and form. Iontophoresis patch donned to R hip per patient request and he may attempt more activity today. Patient reminded to remove in 4 hours.    Personal Factors and Comorbidities  Age;Comorbidity 1    Comorbidities  DM     Examination-Activity Limitations  Stairs;Stand    Stability/Clinical Decision Making  Stable/Uncomplicated    Rehab Potential  Good    PT Frequency  2x / week    PT Duration  6 weeks    PT Treatment/Interventions  ADLs/Self Care Home Management;Cryotherapy;Ultrasound;Moist Heat;Iontophoresis 91m/ml Dexamethasone;Electrical Stimulation;Stair training;Gait training;Therapeutic activities;Therapeutic exercise;Balance training;Neuromuscular re-education;Manual techniques;Dry needling;Passive range of motion;Vasopneumatic Device;Taping;Patient/family education    PT Next Visit Plan  Nustep, hip and knee strengthening, modalities PRN for pain relief. Iontophoresis per referral    PT Home Exercise Plan  see patient education section    Consulted and Agree with Plan of Care  Patient       Patient will benefit from skilled therapeutic intervention in order to improve the following deficits and impairments:  Pain, Decreased activity tolerance, Decreased range of motion, Decreased strength, Difficulty walking  Visit Diagnosis: Chronic pain of left knee  Pain in right hip     Problem List Patient Active Problem List   Diagnosis Date Noted  . H/O total shoulder replacement, right 11/01/2018  . Paroxysmal tachycardia ([facility] 01/11/2017  . Nonrheumatic aortic valve stenosis 01/11/2017  . Type 1 diabetes mellitus without complication ([place] 015/72/6203 . Aortic atherosclerosis ([facility] 11/23/2016  . HNP (herniated nucleus pulposus), lumbar 10/06/2016  . Acute renal injury ([place] 08/06/2016  . Diarrhea 08/06/2016  . Fever 08/06/2016  . Hyperbilirubinemia 08/06/2016  . Septic shock ([facility] 08/06/2016  . Lactic acidosis 08/06/2016  . Type 2 diabetes mellitus with hyperlipidemia (HP[place] 01/18/2016  . Inguinal hernia 12/10/2015  . Right groin pain 11/30/2015  . Thrombocytopenia ([facility] 07/21/2014  . Bilateral carotid bruits 05/11/2014  . Vitamin D deficiency 09/17/2013  . BPH (benign prostatic hyperplasia)  05/22/2013  . 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 Diabetes type 2, controlled (HH[place] 01/10/2013  . Essential hypertension, benign 01/10/2013  . OSA (obstructive sleep apnea) 05/16/2012  . Edema 02/21/2012  . At risk for coronary artery disease 03/20/2011  . Obesity 03/20/2011  . WEIGHT GAIN, ABNORMAL 11/09/2010  . Hyperlipidemia 06/22/2010  . ALLERGIC RHINITIS 06/22/2010  . ASTHMA 06/22/2010  . COUGH 06/22/2010    KStandley Brooking PTA 01/18/2019, 9:36 AM  [facility] [address]Phone: [phone]  Fax:  [phone] Name: WJOSHAWA DUBINMRN: [id number] of Birth: [birth year]

## 2019-01-21 ENCOUNTER — Other Ambulatory Visit: Payer: Self-pay | Admitting: Family Medicine

## 2019-01-22 ENCOUNTER — Other Ambulatory Visit: Payer: Self-pay

## 2019-01-22 ENCOUNTER — Ambulatory Visit: Payer: BLUE CROSS/BLUE SHIELD | Admitting: Physical Therapy

## 2019-01-22 ENCOUNTER — Encounter: Payer: Self-pay | Admitting: Physical Therapy

## 2019-01-22 DIAGNOSIS — M25551 Pain in right hip: Secondary | ICD-10-CM

## 2019-01-22 DIAGNOSIS — G8929 Other chronic pain: Secondary | ICD-10-CM | POA: Diagnosis not present

## 2019-01-22 DIAGNOSIS — M25562 Pain in left knee: Secondary | ICD-10-CM

## 2019-01-22 NOTE — Therapy (Signed)
[facility] [place], [place], [address] Phone: [phone]   Fax:  [phone]  Physical Therapy Treatment  Patient Details  Name: Tony Cantu MRN: [id number] Date of Birth: 1951-04-14 Referring Provider (PT): Frederik Pear, MD   Encounter Date: 01/22/2019  PT End of Session - 01/22/19 0816    Visit Number  6    Number of Visits  12    Date for PT Re-Evaluation  02/26/19    PT Start Time  0812    PT Stop Time  0903    PT Time Calculation (min)  51 min    Activity Tolerance  Patient tolerated treatment well    Behavior During Therapy  [facility] for tasks assessed/performed       Past Medical History:  Diagnosis Date  . Arthritis   . Asthma   . BPH (benign prostatic hypertrophy)   . Colon polyps   . Diabetes mellitus without complication (HCC)    TYPE 1   . Gastritis   . GERD (gastroesophageal reflux disease)   . Headache    HX  . Hypertension   . PONV (postoperative nausea and vomiting)   . Seizures (HCC)    HYPOGLYCEMIC LAST 1 AND 1/2 YRS AGO  . Sleep apnea    uses CPAP nightly  . Testicle trouble    one testicle BORN WITH    Past Surgical History:  Procedure Laterality Date  . BACK SURGERY     94  LOWER   . CARPAL TUNNEL RELEASE Right 10/13/2015   Procedure: RIGHT CARPAL TUNNEL RELEASE;  Surgeon: Daryll Brod, MD;  Location: [place];  Service: Orthopedics;  Laterality: Right;  . CARPAL TUNNEL RELEASE Left 07/05/2016   Procedure: LEFT CARPAL TUNNEL RELEASE;  Surgeon: Daryll Brod, MD;  Location: [place];  Service: Orthopedics;  Laterality: Left;  . HERNIA REPAIR    . INGUINAL HERNIA REPAIR  2003   right   . KNEE ARTHROSCOPY Right 03/03/2016  . LUMBAR DISC SURGERY  8/96   Dr. Coralyn Mark, discectomy  . LUMBAR LAMINECTOMY/DECOMPRESSION MICRODISCECTOMY Right 10/06/2016   Procedure: RIGHT LUMBAR THREE - LUMBAR FOUR  LAMINECTOMY, FORAMINOTOMY AND MICRODISCECTOMY;  Surgeon: Jovita Gamma, MD;  Location: [place];  Service: Neurosurgery;  Laterality: Right;  . SHOULDER SURGERY  11/28/05   left partial  . SHOULDER SURGERY  07/14/2006   RIGHT  . TONSILLECTOMY  AGE 68 OR 4  . TOTAL SHOULDER ARTHROPLASTY Right 11/01/2018   Procedure: RIGHT SHOULDER REVISION TO REVERSE TOTAL SHOULDER;  Surgeon: Tania Ade, MD;  [facility];  Service: Orthopedics;  Laterality: Right;  CHOICE ANESTHESIA WITH INTERSCALENE BLOCK EXPAREL, NEEDS RNFA    There were no vitals filed for this visit.  Subjective Assessment - 01/22/19 0814    Subjective  COVID 19 screening performed on patient prior to entering building. Patient reports his granddaughter kept him busy this past sunday. Denies any other pain during the weekend.    Pertinent History  DM    Limitations  Standing;Walking;House hold activities    How long can you walk comfortably?  less than 1 mile    Diagnostic tests  x-ray: normal    Patient Stated Goals  get better and return to walking program    Currently in Pain?  No/denies         [facility] PT Assessment - 01/22/19 0001      Assessment   Medical Diagnosis  left knee pain and upper peroneal  tendonitis, right greater trochanter bursitis    Referring Provider (PT)  Frederik Pear, MD    Next MD Visit  2 months    Prior Therapy  no      Precautions   Precautions  None                   [facility] Adult PT Treatment/Exercise - 01/22/19 0001      Knee/Hip Exercises: Aerobic   Nustep  L6 x10 min no UEs      Knee/Hip Exercises: Machines for Strengthening   Cybex Knee Extension  10# 3x10 reps    Cybex Knee Flexion  30# 3x10 reps      Knee/Hip Exercises: Standing   Heel Raises  Both;20 reps    Heel Raises Limitations  B toe raise x20 reps    Terminal Knee Extension  Strengthening;Left;2 sets;10 reps;Theraband    Theraband Level (Terminal Knee Extension)  Other (comment)    Terminal Knee Extension Limitations  Pink XTS    Hip Abduction  AROM;Both;20 reps;Knee  straight    Functional Squat  20 reps      Knee/Hip Exercises: Seated   Ball Squeeze  x20 reps    Clamshell with TheraBand  Green   x20 reps   Sit to Sand  15 reps;without UE support      Knee/Hip Exercises: Supine   Straight Leg Raises  AROM;Both;20 reps    Straight Leg Raise with External Rotation  AROM;Both;20 reps      Modalities   Modalities  Iontophoresis      Iontophoresis   Type of Iontophoresis  Dexamethasone    Location  R hip    Dose  1.0 ml    Time  8                  PT Long Term Goals - 01/08/19 7048      PT LONG TERM GOAL #1   Title  Patient will be independent with HEP and its progression    Time  6    Period  Weeks    Status  Achieved      PT LONG TERM GOAL #2   Title  Patient will demonstrate 5/5 left knee MMT to improve stability during functional tasks.    Time  6    Period  Weeks    Status  On-going      PT LONG TERM GOAL #3   Title  Patient will demonstrate 4+/5 or greater right hip MMT to improve stability during funtional tasks.    Time  6    Period  Weeks    Status  On-going      PT LONG TERM GOAL #4   Title  Patient will report ability to walk with right hip pain less than 3/10.    Time  6    Period  Weeks    Status  On-going      PT LONG TERM GOAL #5   Title  Patient will report ability to negotiate steps with a reciprocating gait pattern with pain less than 3/10 to safely access basement.     Baseline  6    Period  Weeks    Status  On-going            Plan - 01/22/19 0907    Clinical Impression Statement  Patient presented in clinic with denial of any hip/knee pain over the weekend. Patient progressed with all reps and therex today with no complaints.  Sit to stands completed to plinth table to monitor technique. Iontophoresis patch donned to R greater trochanter with reminder to remove in 4 hours. Patient has not attempted prolonged walking at this time.    Personal Factors and Comorbidities  Age;Comorbidity 1     Comorbidities  DM    Examination-Activity Limitations  Stairs;Stand    Stability/Clinical Decision Making  Stable/Uncomplicated    Rehab Potential  Good    PT Frequency  2x / week    PT Duration  6 weeks    PT Treatment/Interventions  ADLs/Self Care Home Management;Cryotherapy;Ultrasound;Moist Heat;Iontophoresis 54m/ml Dexamethasone;Electrical Stimulation;Stair training;Gait training;Therapeutic activities;Therapeutic exercise;Balance training;Neuromuscular re-education;Manual techniques;Dry needling;Passive range of motion;Vasopneumatic Device;Taping;Patient/family education    PT Next Visit Plan  Nustep, hip and knee strengthening, modalities PRN for pain relief. Iontophoresis per referral    PT Home Exercise Plan  see patient education section    Consulted and Agree with Plan of Care  Patient       Patient will benefit from skilled therapeutic intervention in order to improve the following deficits and impairments:  Pain, Decreased activity tolerance, Decreased range of motion, Decreased strength, Difficulty walking  Visit Diagnosis: Chronic pain of left knee  Pain in right hip     Problem List Patient Active Problem List   Diagnosis Date Noted  . H/O total shoulder replacement, right 11/01/2018  . Paroxysmal tachycardia (HL[place] 01/11/2017  . Nonrheumatic aortic valve stenosis 01/11/2017  . Type 1 diabetes mellitus without complication (H[place] 029/79/8921 . Aortic atherosclerosis (HH[place] 11/23/2016  . HNP (herniated nucleus pulposus), lumbar 10/06/2016  . Acute renal injury (HL[place] 08/06/2016  . Diarrhea 08/06/2016  . Fever 08/06/2016  . Hyperbilirubinemia 08/06/2016  . Septic shock ([place] 08/06/2016  . Lactic acidosis 08/06/2016  . Type 2 diabetes mellitus with hyperlipidemia (H[place] 01/18/2016  . Inguinal hernia 12/10/2015  . Right groin pain 11/30/2015  . Thrombocytopenia ([place] 07/21/2014  . Bilateral carotid bruits 05/11/2014  . Vitamin D deficiency 09/17/2013  . BPH (benign  prostatic hyperplasia) 05/22/2013  . 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 Diabetes type 2, controlled ([place]Gr[place] 01/10/2013  . Essential hypertension, benign 01/10/2013  . OSA (obstructive sleep apnea) 05/16/2012  . Edema 02/21/2012  . At risk for coronary artery disease 03/20/2011  . Obesity 03/20/2011  . WEIGHT GAIN, ABNORMAL 11/09/2010  . Hyperlipidemia 06/22/2010  . ALLERGIC RHINITIS 06/22/2010  . ASTHMA 06/22/2010  . COUGH 06/22/2010    KStandley Brooking PTA 01/22/2019, 9:22 AM  [facility] [address]Phone: [phone]  Fax:  [phone] Name: Tony MEDINGERMRN: [id number] of Birth: [birth year]

## 2019-01-23 DIAGNOSIS — Z96611 Presence of right artificial shoulder joint: Secondary | ICD-10-CM | POA: Diagnosis not present

## 2019-01-23 DIAGNOSIS — M25562 Pain in left knee: Secondary | ICD-10-CM | POA: Diagnosis not present

## 2019-01-23 DIAGNOSIS — Z471 Aftercare following joint replacement surgery: Secondary | ICD-10-CM | POA: Diagnosis not present

## 2019-01-25 ENCOUNTER — Encounter: Payer: Self-pay | Admitting: Physical Therapy

## 2019-01-25 ENCOUNTER — Other Ambulatory Visit: Payer: Self-pay | Admitting: *Deleted

## 2019-01-25 ENCOUNTER — Other Ambulatory Visit: Payer: Self-pay

## 2019-01-25 ENCOUNTER — Ambulatory Visit: Payer: BLUE CROSS/BLUE SHIELD | Admitting: Physical Therapy

## 2019-01-25 DIAGNOSIS — M25551 Pain in right hip: Secondary | ICD-10-CM | POA: Diagnosis not present

## 2019-01-25 DIAGNOSIS — M25562 Pain in left knee: Secondary | ICD-10-CM | POA: Diagnosis not present

## 2019-01-25 DIAGNOSIS — G8929 Other chronic pain: Secondary | ICD-10-CM

## 2019-01-25 DIAGNOSIS — Z1211 Encounter for screening for malignant neoplasm of colon: Secondary | ICD-10-CM

## 2019-01-25 DIAGNOSIS — R7989 Other specified abnormal findings of blood chemistry: Secondary | ICD-10-CM

## 2019-01-25 MED ORDER — TESTOSTERONE 30 MG/ACT TD SOLN: TRANSDERMAL | 1 refills | Status: DC

## 2019-01-25 NOTE — Therapy (Signed)
Tony Cantu, [place], [address] Phone: [phone]   Fax:  [phone]  Physical Therapy Treatment  Patient Details  Name: Tony Cantu MRN: [id number] Date of Birth: 04-Sep-1950 Referring Provider (PT): Frederik Pear, MD   Encounter Date: 01/25/2019  PT End of Session - 01/25/19 0820    Visit Number  7    Number of Visits  12    Date for PT Re-Evaluation  02/26/19    PT Start Time  0813    PT Stop Time  4401    PT Time Calculation (min)  42 min    Activity Tolerance  Patient tolerated treatment well    Behavior During Therapy  [facility] for tasks assessed/performed       Past Medical History:  Diagnosis Date  . Arthritis   . Asthma   . BPH (benign prostatic hypertrophy)   . Colon polyps   . Diabetes mellitus without complication (HCC)    TYPE 1   . Gastritis   . GERD (gastroesophageal reflux disease)   . Headache    HX  . Hypertension   . PONV (postoperative nausea and vomiting)   . Seizures (HCC)    HYPOGLYCEMIC LAST 1 AND 1/2 YRS AGO  . Sleep apnea    uses CPAP nightly  . Testicle trouble    one testicle BORN WITH    Past Surgical History:  Procedure Laterality Date  . BACK SURGERY     94  LOWER   . CARPAL TUNNEL RELEASE Right 10/13/2015   Procedure: RIGHT CARPAL TUNNEL RELEASE;  Surgeon: Daryll Brod, MD;  Location: [place];  Service: Orthopedics;  Laterality: Right;  . CARPAL TUNNEL RELEASE Left 07/05/2016   Procedure: LEFT CARPAL TUNNEL RELEASE;  Surgeon: Daryll Brod, MD;  Location: [place];  Service: Orthopedics;  Laterality: Left;  . HERNIA REPAIR    . INGUINAL HERNIA REPAIR  2003   right   . KNEE ARTHROSCOPY Right 03/03/2016  . LUMBAR DISC SURGERY  8/96   Dr. Coralyn Mark, discectomy  . LUMBAR LAMINECTOMY/DECOMPRESSION MICRODISCECTOMY Right 10/06/2016   Procedure: RIGHT LUMBAR THREE - LUMBAR FOUR  LAMINECTOMY, FORAMINOTOMY AND MICRODISCECTOMY;  Surgeon: Jovita Gamma, MD;  Location: [place];  Service: Neurosurgery;  Laterality: Right;  . SHOULDER SURGERY  11/28/05   left partial  . SHOULDER SURGERY  07/14/2006   RIGHT  . TONSILLECTOMY  AGE 68 OR 4  . TOTAL SHOULDER ARTHROPLASTY Right 11/01/2018   Procedure: RIGHT SHOULDER REVISION TO REVERSE TOTAL SHOULDER;  Surgeon: Tania Ade, MD;  Location[facility];  Service: Orthopedics;  Laterality: Right;  CHOICE ANESTHESIA WITH INTERSCALENE BLOCK EXPAREL, NEEDS RNFA    There were no vitals filed for this visit.  Subjective Assessment - 01/25/19 0818    Subjective  COVID 19 screening performed on patient prior to entering building. Reports soreness after mowing yesterday.    Pertinent History  DM    Limitations  Standing;Walking;House hold activities    How long can you walk comfortably?  less than 1 mile    Diagnostic tests  x-ray: normal    Patient Stated Goals  get better and return to walking program    Currently in Pain?  No/denies         [facility] PT Assessment - 01/25/19 0001      Assessment   Medical Diagnosis  left knee pain and upper peroneal tendonitis, right greater trochanter bursitis    Referring Provider (PT)  Frederik Pear, MD    Next MD Visit  2 months    Prior Therapy  no      Precautions   Precautions  None                   [facility] Adult PT Treatment/Exercise - 01/25/19 0001      Knee/Hip Exercises: Aerobic   Nustep  L6 x10 min no UEs      Knee/Hip Exercises: Machines for Strengthening   Cybex Knee Extension  20# 3x10 reps    Cybex Knee Flexion  40# 3x10 reps      Knee/Hip Exercises: Standing   Heel Raises  Both;20 reps    Heel Raises Limitations  B toe raise x20 reps    Hip Abduction  AROM;Both;20 reps;Knee straight    Lateral Step Up  Both;20 reps;Hand Hold: 2;Step Height: 8"    Forward Step Up  Both;2 sets;10 reps;Hand Hold: 2;Step Height: 8"    Step Down  Left;20 reps;Hand Hold: 2;Step Height: 4"    Functional Squat  20 reps    Walking with Sports Cord   B sides, backwards x15 reps each with orange XTS      Knee/Hip Exercises: Seated   Clamshell with Marga Hoots   x20 reps                 PT Long Term Goals - 01/08/19 8841      PT LONG TERM GOAL #1   Title  Patient will be independent with HEP and its progression    Time  6    Period  Weeks    Status  Achieved      PT LONG TERM GOAL #2   Title  Patient will demonstrate 5/5 left knee MMT to improve stability during functional tasks.    Time  6    Period  Weeks    Status  On-going      PT LONG TERM GOAL #3   Title  Patient will demonstrate 4+/5 or greater right hip MMT to improve stability during funtional tasks.    Time  6    Period  Weeks    Status  On-going      PT LONG TERM GOAL #4   Title  Patient will report ability to walk with right hip pain less than 3/10.    Time  6    Period  Weeks    Status  On-going      PT LONG TERM GOAL #5   Title  Patient will report ability to negotiate steps with a reciprocating gait pattern with pain less than 3/10 to safely access basement.     Baseline  6    Period  Weeks    Status  On-going            Plan - 01/25/19 0900    Clinical Impression Statement  Patient presented in clinic with no complaints of hip or knee pain although he has not done any prolonged recreational walking yet. Patient guided through more advanced hip/knee strengthening exercises with resistance but no complaints from patient. Patient able to complete all exercises with good overall technique following initial VCs and demo. No ionto patch donned today as patient was encouraged to attempt prolonged walking and assess his response before patch continued. No complaints from patient following end of treatment.     Personal Factors and Comorbidities  Age;Comorbidity 1    Comorbidities  DM    Examination-Activity Limitations  Stairs;Stand    Stability/Clinical Decision Making  Stable/Uncomplicated    Rehab Potential  Good    PT Frequency  2x  / week    PT Duration  6 weeks    PT Treatment/Interventions  ADLs/Self Care Home Management;Cryotherapy;Ultrasound;Moist Heat;Iontophoresis 42m/ml Dexamethasone;Electrical Stimulation;Stair training;Gait training;Therapeutic activities;Therapeutic exercise;Balance training;Neuromuscular re-education;Manual techniques;Dry needling;Passive range of motion;Vasopneumatic Device;Taping;Patient/family education    PT Next Visit Plan  Nustep, hip and knee strengthening, modalities PRN for pain relief. Iontophoresis per referral    PT Home Exercise Plan  see patient education section    Consulted and Agree with Plan of Care  Patient       Patient will benefit from skilled therapeutic intervention in order to improve the following deficits and impairments:  Pain, Decreased activity tolerance, Decreased range of motion, Decreased strength, Difficulty walking  Visit Diagnosis: Chronic pain of left knee  Pain in right hip     Problem List Patient Active Problem List   Diagnosis Date Noted  . H/O total shoulder replacement, right 11/01/2018  . Paroxysmal tachycardia ([place] 01/11/2017  . Nonrheumatic aortic valve stenosis 01/11/2017  . Type 1 diabetes mellitus without complication (HP[place] 024/40/1027 . Aortic atherosclerosis (HT[place] 11/23/2016  . HNP (herniated nucleus pulposus), lumbar 10/06/2016  . Acute renal injury (HD[place] 08/06/2016  . Diarrhea 08/06/2016  . Fever 08/06/2016  . Hyperbilirubinemia 08/06/2016  . Septic shock ([address] 08/06/2016  . Lactic acidosis 08/06/2016  . Type 2 diabetes mellitus with hyperlipidemia (HR[place] 01/18/2016  . Inguinal hernia 12/10/2015  . Right groin pain 11/30/2015  . Thrombocytopenia (HC[place] 07/21/2014  . Bilateral carotid bruits 05/11/2014  . Vitamin D deficiency 09/17/2013  . BPH (benign prostatic hyperplasia) 05/22/2013  . 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 Diabetes type 2, controlled (HC[place] 01/10/2013  . Essential hypertension, benign 01/10/2013  .  OSA (obstructive sleep apnea) 05/16/2012  . Edema 02/21/2012  . At risk for coronary artery disease 03/20/2011  . Obesity 03/20/2011  . WEIGHT GAIN, ABNORMAL 11/09/2010  . Hyperlipidemia 06/22/2010  . ALLERGIC RHINITIS 06/22/2010  . ASTHMA 06/22/2010  . COUGH 06/22/2010    KStandley Brooking PTA 01/25/2019, 9:15 AM  [facility] [address]Phone: [phone]  Fax:  [phone] Name: WWAYDE GOPAULMRN: [id number] of Birth: 206-13-1952

## 2019-01-25 NOTE — Progress Notes (Signed)
Please refill the testosterone if it is needed.

## 2019-01-28 ENCOUNTER — Encounter: Payer: Self-pay | Admitting: Physical Therapy

## 2019-01-28 ENCOUNTER — Other Ambulatory Visit: Payer: Self-pay

## 2019-01-28 ENCOUNTER — Ambulatory Visit: Payer: BC Managed Care – PPO | Attending: Orthopedic Surgery | Admitting: Physical Therapy

## 2019-01-28 DIAGNOSIS — M25562 Pain in left knee: Secondary | ICD-10-CM | POA: Insufficient documentation

## 2019-01-28 DIAGNOSIS — M25551 Pain in right hip: Secondary | ICD-10-CM | POA: Diagnosis not present

## 2019-01-28 DIAGNOSIS — G8929 Other chronic pain: Secondary | ICD-10-CM | POA: Insufficient documentation

## 2019-01-28 NOTE — Therapy (Signed)
[facility] [place], [place], [address] Phone: [phone]   Fax:  [phone]  Physical Therapy Treatment  Patient Details  Name: Tony Cantu MRN: [id number] Date of Birth: 19-Oct-1950 Referring Provider (PT): Frederik Pear, MD   Encounter Date: 01/28/2019  PT End of Session - 01/28/19 6962    Visit Number  8    Number of Visits  12    Date for PT Re-Evaluation  02/26/19    PT Start Time  0810    PT Stop Time  0900    PT Time Calculation (min)  50 min    Activity Tolerance  Patient tolerated treatment well    Behavior During Therapy  [facility] for tasks assessed/performed       Past Medical History:  Diagnosis Date  . Arthritis   . Asthma   . BPH (benign prostatic hypertrophy)   . Colon polyps   . Diabetes mellitus without complication (HCC)    TYPE 1   . Gastritis   . GERD (gastroesophageal reflux disease)   . Headache    HX  . Hypertension   . PONV (postoperative nausea and vomiting)   . Seizures (HCC)    HYPOGLYCEMIC LAST 1 AND 1/2 YRS AGO  . Sleep apnea    uses CPAP nightly  . Testicle trouble    one testicle BORN WITH    Past Surgical History:  Procedure Laterality Date  . BACK SURGERY     94  LOWER   . CARPAL TUNNEL RELEASE Right 10/13/2015   Procedure: RIGHT CARPAL TUNNEL RELEASE;  Surgeon: Daryll Brod, MD;  Location: [place];  Service: Orthopedics;  Laterality: Right;  . CARPAL TUNNEL RELEASE Left 07/05/2016   Procedure: LEFT CARPAL TUNNEL RELEASE;  Surgeon: Daryll Brod, MD;  Location: [place];  Service: Orthopedics;  Laterality: Left;  . HERNIA REPAIR    . INGUINAL HERNIA REPAIR  2003   right   . KNEE ARTHROSCOPY Right 03/03/2016  . LUMBAR DISC SURGERY  8/96   Dr. Coralyn Mark, discectomy  . LUMBAR LAMINECTOMY/DECOMPRESSION MICRODISCECTOMY Right 10/06/2016   Procedure: RIGHT LUMBAR THREE - LUMBAR FOUR  LAMINECTOMY, FORAMINOTOMY AND MICRODISCECTOMY;  Surgeon: Jovita Gamma, MD;  Location: [place];  Service: Neurosurgery;  Laterality: Right;  . SHOULDER SURGERY  11/28/05   left partial  . SHOULDER SURGERY  07/14/2006   RIGHT  . TONSILLECTOMY  AGE 23 OR 4  . TOTAL SHOULDER ARTHROPLASTY Right 11/01/2018   Procedure: RIGHT SHOULDER REVISION TO REVERSE TOTAL SHOULDER;  Surgeon: Tania Ade, MD;  [facility];  Service: Orthopedics;  Laterality: Right;  CHOICE ANESTHESIA WITH INTERSCALENE BLOCK EXPAREL, NEEDS RNFA    There were no vitals filed for this visit.  Subjective Assessment - 01/28/19 0820    Subjective  COVID 19 screening performed on patient prior to entering building. Patient reported he went for a walk and had soreness in the R hip after 0.8 mile. Patient reported it was not the sharp pain so he feels like he's making an improvement.     Pertinent History  DM    Limitations  Standing;Walking;House hold activities    How long can you walk comfortably?  less than 1 mile    Diagnostic tests  x-ray: normal    Patient Stated Goals  get better and return to walking program    Currently in Pain?  No/denies         [facility] PT Assessment - 01/28/19 0001  Assessment   Medical Diagnosis  left knee pain and upper peroneal tendonitis, right greater trochanter bursitis    Referring Provider (PT)  Frederik Pear, MD    Next MD Visit  2 months    Prior Therapy  no      Precautions   Precautions  None                   [facility] Adult PT Treatment/Exercise - 01/28/19 0001      Knee/Hip Exercises: Aerobic   Nustep  L6 x10 min no UEs      Knee/Hip Exercises: Machines for Strengthening   Cybex Knee Extension  20# 3x10 reps    Cybex Knee Flexion  40# 3x10 reps      Knee/Hip Exercises: Standing   Heel Raises  Both;20 reps    Heel Raises Limitations  3D heel raises, B toe raise x20 reps    Rocker Board  2 minutes   AP balance, followed by lateral balance   Walking with Sports Cord  B sides, backwards, forward x10 reps each with orange  XTS    Other Standing Knee Exercises  semi tandem stance on airex x1 min each, NBOS x1 min with ball toss      Knee/Hip Exercises: Seated   Clamshell with TheraBand  Green   x20 reps   Sit to Sand  without UE support;20 reps      Iontophoresis   Type of Iontophoresis  Dexamethasone    Location  R hip    Dose  1.0 ml (5/6)    Time  8                  PT Long Term Goals - 01/08/19 2353      PT LONG TERM GOAL #1   Title  Patient will be independent with HEP and its progression    Time  6    Period  Weeks    Status  Achieved      PT LONG TERM GOAL #2   Title  Patient will demonstrate 5/5 left knee MMT to improve stability during functional tasks.    Time  6    Period  Weeks    Status  On-going      PT LONG TERM GOAL #3   Title  Patient will demonstrate 4+/5 or greater right hip MMT to improve stability during funtional tasks.    Time  6    Period  Weeks    Status  On-going      PT LONG TERM GOAL #4   Title  Patient will report ability to walk with right hip pain less than 3/10.    Time  6    Period  Weeks    Status  On-going      PT LONG TERM GOAL #5   Title  Patient will report ability to negotiate steps with a reciprocating gait pattern with pain less than 3/10 to safely access basement.     Baseline  6    Period  Weeks    Status  On-going            Plan - 01/28/19 0904    Clinical Impression Statement  Patient was able to tolerate the progression of treatment well and was able to demonstrate strong use of ankle strategies during balance activities. Patient did report an ache with some hip strengthening exercises but denied pain. Patient demonstrated proper technique and form with all exercises. Iontophoresis pach #5  of 6 was donned and patient demonstrated strong carryover of iontophoresis education as patient was able to verbalize removal time.     Personal Factors and Comorbidities  Age;Comorbidity 1    Comorbidities  DM    Examination-Activity  Limitations  Stairs;Stand    Stability/Clinical Decision Making  Stable/Uncomplicated    Clinical Decision Making  Low    Rehab Potential  Good    PT Frequency  2x / week    PT Duration  6 weeks    PT Treatment/Interventions  ADLs/Self Care Home Management;Cryotherapy;Ultrasound;Moist Heat;Iontophoresis 80m/ml Dexamethasone;Electrical Stimulation;Stair training;Gait training;Therapeutic activities;Therapeutic exercise;Balance training;Neuromuscular re-education;Manual techniques;Dry needling;Passive range of motion;Vasopneumatic Device;Taping;Patient/family education    PT Next Visit Plan  Nustep, hip and knee strengthening, modalities PRN for pain relief. Iontophoresis per referral    Consulted and Agree with Plan of Care  Patient       Patient will benefit from skilled therapeutic intervention in order to improve the following deficits and impairments:  Pain, Decreased activity tolerance, Decreased range of motion, Decreased strength, Difficulty walking  Visit Diagnosis: Chronic pain of left knee  Pain in right hip     Problem List Patient Active Problem List   Diagnosis Date Noted  . H/O total shoulder replacement, right 11/01/2018  . Paroxysmal tachycardia ([place] 01/11/2017  . Nonrheumatic aortic valve stenosis 01/11/2017  . Type 1 diabetes mellitus without complication (HBladen[place] 094/49/6759 . Aortic atherosclerosis (H[place] 11/23/2016  . HNP (herniated nucleus pulposus), lumbar 10/06/2016  . Acute renal injury (HK[place] 08/06/2016  . Diarrhea 08/06/2016  . Fever 08/06/2016  . Hyperbilirubinemia 08/06/2016  . Septic shock (HD[place] 08/06/2016  . Lactic acidosis 08/06/2016  . Type 2 diabetes mellitus with hyperlipidemia (HH[place] 01/18/2016  . Inguinal hernia 12/10/2015  . Right groin pain 11/30/2015  . Thrombocytopenia (HJ[place] 07/21/2014  . Bilateral carotid bruits 05/11/2014  . Vitamin D deficiency 09/17/2013  . BPH (benign prostatic hyperplasia) 05/22/2013  . Low serum testosterone level  02/18/2013  . Diabetes type 2, controlled (HO[place] 01/10/2013  . Essential hypertension, benign 01/10/2013  . OSA (obstructive sleep apnea) 05/16/2012  . Edema 02/21/2012  . At risk for coronary artery disease 03/20/2011  . Obesity 03/20/2011  . WEIGHT GAIN, ABNORMAL 11/09/2010  . Hyperlipidemia 06/22/2010  . ALLERGIC RHINITIS 06/22/2010  . ASTHMA 06/22/2010  . COUGH 06/22/2010   KGabriela Eves PT, DPT 01/28/2019, 9:08 AM  [facility] [address]Phone: [phone]  Fax:  [phone] Name: Tony WHIPPMRN: [id number] of Birth: [birth year]

## 2019-01-29 ENCOUNTER — Encounter: Payer: Self-pay | Admitting: Family Medicine

## 2019-01-29 ENCOUNTER — Ambulatory Visit (INDEPENDENT_AMBULATORY_CARE_PROVIDER_SITE_OTHER): Payer: BC Managed Care – PPO | Admitting: Family Medicine

## 2019-01-29 DIAGNOSIS — E782 Mixed hyperlipidemia: Secondary | ICD-10-CM | POA: Diagnosis not present

## 2019-01-29 DIAGNOSIS — I700 Atherosclerosis of aorta: Secondary | ICD-10-CM

## 2019-01-29 DIAGNOSIS — I479 Paroxysmal tachycardia, unspecified: Secondary | ICD-10-CM

## 2019-01-29 DIAGNOSIS — E0843 Diabetes mellitus due to underlying condition with diabetic autonomic (poly)neuropathy: Secondary | ICD-10-CM

## 2019-01-29 DIAGNOSIS — E559 Vitamin D deficiency, unspecified: Secondary | ICD-10-CM | POA: Diagnosis not present

## 2019-01-29 DIAGNOSIS — R7989 Other specified abnormal findings of blood chemistry: Secondary | ICD-10-CM

## 2019-01-29 DIAGNOSIS — G4733 Obstructive sleep apnea (adult) (pediatric): Secondary | ICD-10-CM

## 2019-01-29 DIAGNOSIS — Z96611 Presence of right artificial shoulder joint: Secondary | ICD-10-CM

## 2019-01-29 DIAGNOSIS — Z122 Encounter for screening for malignant neoplasm of respiratory organs: Secondary | ICD-10-CM

## 2019-01-29 DIAGNOSIS — I10 Essential (primary) hypertension: Secondary | ICD-10-CM

## 2019-01-29 DIAGNOSIS — R945 Abnormal results of liver function studies: Secondary | ICD-10-CM

## 2019-01-29 DIAGNOSIS — E349 Endocrine disorder, unspecified: Secondary | ICD-10-CM

## 2019-01-29 DIAGNOSIS — D696 Thrombocytopenia, unspecified: Secondary | ICD-10-CM

## 2019-01-29 DIAGNOSIS — N400 Enlarged prostate without lower urinary tract symptoms: Secondary | ICD-10-CM

## 2019-01-29 DIAGNOSIS — E1142 Type 2 diabetes mellitus with diabetic polyneuropathy: Secondary | ICD-10-CM

## 2019-01-29 MED ORDER — SILDENAFIL CITRATE 20 MG PO TABS: ORAL_TABLET | ORAL | 1 refills | Status: DC

## 2019-01-29 NOTE — Patient Instructions (Signed)
Continue with aggressive therapeutic lifestyle changes Always be careful to check blood sugars and make sure you do not become hypoglycemic Continue to practice good respiratory and hand hygiene Continue to drink plenty of water Follow-up with orthopedics as planned Follow-up with cardiology as planned and generally because of increased risk factors and family history, patient should see the cardiologist on a regular basis.

## 2019-01-29 NOTE — Progress Notes (Signed)
Virtual Visit Via telephone Note I connected with@ on 01/29/19 by telephone and verified that I am speaking with the correct person or authorized healthcare agent using two identifiers. Tony Cantu is currently located at home and there are no unauthorized people in close proximity. I completed this visit while in a private location in my home .  This visit type was conducted due to national recommendations for restrictions regarding the COVID-19 Pandemic (e.g. social distancing).  This format is felt to be most appropriate for this patient at this time.  All issues noted in this document were discussed and addressed.  No physical exam was performed.    I discussed the limitations, risks, security and privacy concerns of performing an evaluation and management service by telephone and the availability of in person appointments. I also discussed with the patient that there may be a patient responsible charge related to this service. The patient expressed understanding and agreed to proceed.   Date:  01/29/2019    ID:  Leafy Kindle Wattenbarger      [birth year]        [id number]   Patient Care Team Patient Care Team: Chipper Herb, MD as PCP - General (Family Medicine) Minus Breeding, MD as PCP - Cardiology (Cardiology) Larey Dresser, MD (Cardiology) Clance, Armando Reichert, MD (Pulmonary Disease) Teena Irani, MD (Inactive) (Gastroenterology)  Reason for Visit: Primary Care Follow-up     History of Present Illness & Review of Systems:     Tony Cantu is a 68 y.o. year old male primary care patient that presents today for a telehealth visit.  The patient is doing well overall and just completed a redo on his right shoulder.  He did rehab at home because of previous shoulder surgery.  He is recovering well from this.  Today he denies any chest pain pressure tightness or shortness of breath.  He denies any trouble with swallowing heartburn indigestion nausea vomiting diarrhea or blood in the stool.   He is due to get a colonoscopy and arrangements are being made the gastroenterologist to get this done.  He also has persistent elevated liver function test and did have a CT scan a couple of years ago and will discuss these elevated liver function test with the gastroenterologist when he sees him.  He is passing his water well.  He is on testosterone replacement and understands that he needs more frequent rectal exams and monitoring of his testosterone levels with testosterone replacement.  He needs to get an eye exam but is behind schedule on this because of COVID-19.  He has an appointment to see the cardiologist sometime the end of the summer on a regular schedule.  Review of systems as stated, otherwise negative.  The patient does not have symptoms concerning for COVID-19 infection (fever, chills, cough, or new shortness of breath).      Current Medications (Verified) Allergies as of 01/29/2019      Reactions   Erythromycin Diarrhea   Sulfonamide Derivatives Diarrhea      Medication List       Accurate as of January 29, 2019  8:32 AM. If you have any questions, ask your nurse or doctor.        albuterol 108 (90 Base) MCG/ACT inhaler Commonly known as:  VENTOLIN HFA Inhale 2 puffs into the lungs every 6 (six) hours as needed for wheezing or shortness of breath.   aspirin 325 MG EC tablet Take 1 tablet (325  mg total) by mouth 2 (two) times daily.   atorvastatin 40 MG tablet Commonly known as:  LIPITOR TAKE 1 TABLET BY MOUTH  DAILY   b complex vitamins tablet Take 1 tablet by mouth daily at 12 noon.   Calcium Carbonate-Vitamin D 600-400 MG-UNIT tablet Take 1 tablet by mouth daily.   fexofenadine 180 MG tablet Commonly known as:  ALLEGRA Take 1 tablet (180 mg total) by mouth daily with breakfast.   FLAX SEED OIL PO Take 1,300 mg by mouth daily with breakfast.   FreeStyle Libre Reader Devi 1 applicator by Does not apply route as directed.   FreeStyle [employer] System  Misc Check BS eight (8) times a day. Dx E10.9   furosemide 20 MG tablet Commonly known as:  LASIX TAKE 1 AND 1/2 TABLETS BY  MOUTH DAILY   Glucagon Emergency 1 MG injection Generic drug:  glucagon INJECT 1MG INTO SKIN ONCE  AS NEEDED What changed:  See the new instructions.   Glucosamine 1500 Complex Caps Take 2 capsules by mouth daily at 12 noon.   HumaLOG 100 UNIT/ML injection Generic drug:  insulin lispro INJECT 20 TO 50 UNITS  SUBCUTANEOUSLY 3 TIMES  DAILY BEFORE MEALS PER SLIDING SCALE What changed:  See the new instructions.   meloxicam 15 MG tablet Commonly known as:  MOBIC   methocarbamol 500 MG tablet Commonly known as:  Robaxin Take 1 tablet (500 mg total) by mouth 3 (three) times daily as needed for muscle spasms.   montelukast 10 MG tablet Commonly known as:  SINGULAIR TAKE 1 TABLET BY MOUTH AT  BEDTIME   multivitamin-lutein Caps capsule Take 1 capsule by mouth daily at 12 noon.   mupirocin ointment 2 % Commonly known as:  BACTROBAN Apply 1 application topically 2 (two) times daily.   oxyCODONE-acetaminophen 5-325 MG tablet Commonly known as:  Percocet Take 1 tablet by mouth every 4 (four) hours as needed for severe pain.   pantoprazole 40 MG tablet Commonly known as:  PROTONIX TAKE 1 TABLET BY MOUTH  DAILY   sildenafil 20 MG tablet Commonly known as:  REVATIO TAKE 2-5 TABLETS AS NEEDED PRIOR TO SEXUAL ACTIVITY   Testosterone 30 MG/ACT Soln USE 1 PUMP UNDER EACH  AXILLA DAILY (TOTAL OF 60MG DAILY)   Toujeo Max SoloStar 300 UNIT/ML Sopn Generic drug:  Insulin Glargine (2 Unit Dial) INJECT SUBCUTANEOUSLY 120  UNITS DAILY   triamcinolone acetonide 40 MG/ML SUSP 40 mg, mupirocin cream 2 % CREA 15 application Apply 1 application topically 2 (two) times daily.   UNABLE TO FIND CAMPHOPHENIQUE QHS TO AREA BRELOW NOSE   valsartan 160 MG tablet Commonly known as:  DIOVAN TAKE 1 TABLET BY MOUTH  DAILY   Vitamin D3 50 MCG (2000 UT) capsule Take  2,000 Units by mouth daily at 12 noon.           Allergies (Verified)    Erythromycin and Sulfonamide derivatives  Past Medical History Past Medical History:  Diagnosis Date  . Arthritis   . Asthma   . BPH (benign prostatic hypertrophy)   . Colon polyps   . Diabetes mellitus without complication (HCC)    TYPE 1   . Gastritis   . GERD (gastroesophageal reflux disease)   . Headache    HX  . Hypertension   . PONV (postoperative nausea and vomiting)   . Seizures (HCC)    HYPOGLYCEMIC LAST 1 AND 1/2 YRS AGO  . Sleep apnea    uses CPAP nightly  .  Testicle trouble    one testicle BORN WITH     Past Surgical History:  Procedure Laterality Date  . BACK SURGERY     94  LOWER   . CARPAL TUNNEL RELEASE Right 10/13/2015   Procedure: RIGHT CARPAL TUNNEL RELEASE;  Surgeon: Daryll Brod, MD;  Location: [facility];  Service: Orthopedics;  Laterality: Right;  . CARPAL TUNNEL RELEASE Left 07/05/2016   Procedure: LEFT CARPAL TUNNEL RELEASE;  Surgeon: Daryll Brod, MD;  Location: [facility];  Service: Orthopedics;  Laterality: Left;  . HERNIA REPAIR    . INGUINAL HERNIA REPAIR  2003   right   . KNEE ARTHROSCOPY Right 03/03/2016  . LUMBAR DISC SURGERY  8/96   Dr. Coralyn Mark, discectomy  . LUMBAR LAMINECTOMY/DECOMPRESSION MICRODISCECTOMY Right 10/06/2016   Procedure: RIGHT LUMBAR THREE - LUMBAR FOUR  LAMINECTOMY, FORAMINOTOMY AND MICRODISCECTOMY;  Surgeon: Jovita Gamma, MD;  Location: [facility];  Service: Neurosurgery;  Laterality: Right;  . SHOULDER SURGERY  11/28/05   left partial  . SHOULDER SURGERY  07/14/2006   RIGHT  . TONSILLECTOMY  AGE 89 OR 4  . TOTAL SHOULDER ARTHROPLASTY Right 11/01/2018   Procedure: RIGHT SHOULDER REVISION TO REVERSE TOTAL SHOULDER;  Surgeon: Tania Ade, MD;  Location: [facility];  Service: Orthopedics;  Laterality: Right;  CHOICE ANESTHESIA WITH INTERSCALENE BLOCK EXPAREL, NEEDS RNFA    Social History   Socioeconomic History  .  Marital status: Married    Spouse name: Not on file  . Number of children: Not on file  . Years of education: Not on file  . Highest education level: Not on file  Occupational History  . Occupation: retired   Scientific laboratory technician  . Financial resource strain: Not on file  . Food insecurity:    Worry: Not on file    Inability: Not on file  . Transportation needs:    Medical: Not on file    Non-medical: Not on file  Tobacco Use  . Smoking status: Former Smoker    Years: 34.00    Types: Cigarettes, Pipe  . Smokeless tobacco: Never Used  . Tobacco comment: quit 2005 smoked cigarettes for 5 yrs prior to pipe use  Substance and Sexual Activity  . Alcohol use: No    Comment: once a year  . Drug use: No  . Sexual activity: Yes  Lifestyle  . Physical activity:    Days per week: Not on file    Minutes per session: Not on file  . Stress: Not on file  Relationships  . Social connections:    Talks on phone: Not on file    Gets together: Not on file    Attends religious service: Not on file    Active member of club or organization: Not on file    Attends meetings of clubs or organizations: Not on file    Relationship status: Not on file  Other Topics Concern  . Not on file  Social History Narrative  . Not on file     Family History  Problem Relation Age of Onset  . Heart failure Mother   . Heart attack Mother        in her 59's  . Alzheimer's disease Mother   . Diabetes Mother   . Asthma Father   . Suicidality Father        in his 68's  . Allergic rhinitis Sister   . Heart failure Maternal Grandmother   . Rheum arthritis Maternal Grandmother   .  Breast cancer Maternal Grandmother   . Heart attack Maternal Grandmother        in her 51's      Labs/Other Tests and Data Reviewed:    Wt Readings from Last 3 Encounters:  01/11/19 251 lb 6.4 oz (114 kg)  11/01/18 248 lb (112.5 kg)  10/25/18 248 lb (112.5 kg)   Temp Readings from Last 3 Encounters:  01/11/19 98 F (36.7 C)   11/02/18 98 F (36.7 C) (Oral)  10/25/18 98.1 F (36.7 C) (Oral)   BP Readings from Last 3 Encounters:  01/11/19 108/64  11/02/18 (!) 106/47  10/25/18 (!) 143/72   Pulse Readings from Last 3 Encounters:  01/11/19 69  11/02/18 67  10/25/18 64     Lab Results  Component Value Date   HGBA1C 5.1 01/15/2019   HGBA1C 5.6 10/25/2018   HGBA1C 5.3 09/11/2018   Lab Results  Component Value Date   MICROALBUR 20 01/19/2015   LDLCALC 45 01/15/2019   CREATININE 1.03 01/16/2019       Chemistry      Component Value Date/Time   NA 140 01/16/2019 1432   K 4.6 01/16/2019 1432   CL 100 01/16/2019 1432   CO2 23 01/16/2019 1432   BUN 13 01/16/2019 1432   CREATININE 1.03 01/16/2019 1432   CREATININE 0.82 01/07/2013 0840      Component Value Date/Time   CALCIUM 9.8 01/16/2019 1432   ALKPHOS 65 01/15/2019 0807   AST 52 (H) 01/15/2019 0807   ALT 45 (H) 01/15/2019 0807   BILITOT 0.7 01/15/2019 0807         OBSERVATIONS/ OBJECTIVE:     The patient is alert and has been a good patient monitoring his vital signs and trying to do everything necessary to maintain good health care.  His weight is about 256.  His pulse rate runs between 50 and 65.  His blood pressure typically runs between 120/70 or less.  His fasting blood sugars usually run in the 70s at home.  His feet are okay with no sign of any infection redness or sores.  Physical exam deferred due to nature of telephonic visit.  ASSESSMENT & PLAN    Time:   Today, I have spent 32 minutes with the patient via telephone discussing the above including Covid precautions.     Visit Diagnoses: 1. Mixed hyperlipidemia -Continue with current treatment and aggressive therapeutic lifestyle changes and follow-up with cardiology as planned   2. Vitamin D deficiency -Continue with vitamin D replacement  3. Testosterone deficiency -Continue with testosterone replacement and get testosterone levels regularly and refill generic  Viagra at the drugstore in [place]  4. Morbid obesity ([place]) -Need to work aggressively on weight loss with diet and exercise  5. Essential hypertension -Watch sodium intake and monitor blood pressures closely and keep it under good control always avoid NSAIDs as much as possible  6. Thrombocytopenia ([place]) -History today of bleeding or increased bruising.  7. Benign prostatic hyperplasia, unspecified whether lower urinary tract symptoms present -Rectal exam at next visit  8. Aortic atherosclerosis ([place]) -Continue aggressive therapeutic lifestyle changes keep cholesterol down follow-up with cardiology regularly  9. Diabetic peripheral neuropathy associated with type 2 diabetes mellitus ([place]) -Monitor blood sugars closely and always watch for symptoms with hypoglycemia  10. Diabetes mellitus due to underlying condition with diabetic autonomic neuropathy, unspecified whether long term insulin use (HCC) -Walk and exercise regularly  11. Paroxysmal tachycardia ([place]) -Follow-up with cardiology as planned   12.  OSA (obstructive sleep apnea) -Continue with CPAP  13. Benign prostatic hyperplasia without lower urinary tract symptoms -Check rectal exam regularly due to being on testosterone replacement and monitor testosterone levels  14. H/O total shoulder replacement, right -Follow-up with orthopedic as planned  15.  Increased liver function tests -Please discuss this with gastroenterologist  Patient Instructions  Continue with aggressive therapeutic lifestyle changes Always be careful to check blood sugars and make sure you do not become hypoglycemic Continue to practice good respiratory and hand hygiene Continue to drink plenty of water Follow-up with orthopedics as planned Follow-up with cardiology as planned and generally because of increased risk factors and family history, patient should see the cardiologist on a regular basis.     The above assessment and management  plan was discussed with the patient. The patient verbalized understanding of and has agreed to the management plan. Patient is aware to call the clinic if symptoms persist or worsen. Patient is aware when to return to the clinic for a follow-up visit. Patient educated on when it is appropriate to go to the emergency department.    Chipper Herb, MD [address], [place], [place] [address] Ph [phone]   Arrie Senate MD

## 2019-01-29 NOTE — Addendum Note (Signed)
Addended by: Zannie Cove on: 01/29/2019 02:49 PM   Modules accepted: Orders

## 2019-01-29 NOTE — Addendum Note (Signed)
Addended by: Zannie Cove on: 01/29/2019 02:51 PM   Modules accepted: Orders

## 2019-02-01 ENCOUNTER — Other Ambulatory Visit: Payer: Self-pay

## 2019-02-01 ENCOUNTER — Ambulatory Visit: Payer: BC Managed Care – PPO | Admitting: Physical Therapy

## 2019-02-01 ENCOUNTER — Encounter: Payer: Self-pay | Admitting: Physical Therapy

## 2019-02-01 DIAGNOSIS — M25562 Pain in left knee: Secondary | ICD-10-CM

## 2019-02-01 DIAGNOSIS — M25551 Pain in right hip: Secondary | ICD-10-CM

## 2019-02-01 DIAGNOSIS — G8929 Other chronic pain: Secondary | ICD-10-CM

## 2019-02-01 NOTE — Therapy (Signed)
[facility] [place], [place], [address] Phone: [phone]   Fax:  [phone]  Physical Therapy Treatment  Patient Details  Name: Tony Cantu MRN: [id number] Date of Birth: 1951-06-29 Referring Provider (PT): Frederik Pear, MD   Encounter Date: 02/01/2019  PT End of Session - 02/01/19 0824    Visit Number  9    Number of Visits  12    Date for PT Re-Evaluation  02/26/19    PT Start Time  0811    PT Stop Time  0852    PT Time Calculation (min)  41 min    Activity Tolerance  Patient tolerated treatment well    Behavior During Therapy  [facility] for tasks assessed/performed       Past Medical History:  Diagnosis Date  . Arthritis   . Asthma   . BPH (benign prostatic hypertrophy)   . Colon polyps   . Diabetes mellitus without complication (HCC)    TYPE 1   . Gastritis   . GERD (gastroesophageal reflux disease)   . Headache    HX  . Hypertension   . PONV (postoperative nausea and vomiting)   . Seizures (HCC)    HYPOGLYCEMIC LAST 1 AND 1/2 YRS AGO  . Sleep apnea    uses CPAP nightly  . Testicle trouble    one testicle BORN WITH    Past Surgical History:  Procedure Laterality Date  . BACK SURGERY     94  LOWER   . CARPAL TUNNEL RELEASE Right 10/13/2015   Procedure: RIGHT CARPAL TUNNEL RELEASE;  Surgeon: Daryll Brod, MD;  Location: [facility];  Service: Orthopedics;  Laterality: Right;  . CARPAL TUNNEL RELEASE Left 07/05/2016   Procedure: LEFT CARPAL TUNNEL RELEASE;  Surgeon: Daryll Brod, MD;  Location: [facility];  Service: Orthopedics;  Laterality: Left;  . HERNIA REPAIR    . INGUINAL HERNIA REPAIR  2003   right   . KNEE ARTHROSCOPY Right 03/03/2016  . LUMBAR DISC SURGERY  8/96   Dr. Coralyn Mark, discectomy  . LUMBAR LAMINECTOMY/DECOMPRESSION MICRODISCECTOMY Right 10/06/2016   Procedure: RIGHT LUMBAR THREE - LUMBAR FOUR  LAMINECTOMY, FORAMINOTOMY AND MICRODISCECTOMY;  Surgeon: Jovita Gamma, MD;  Location: [facility];  Service: Neurosurgery;  Laterality: Right;  . SHOULDER SURGERY  11/28/05   left partial  . SHOULDER SURGERY  07/14/2006   RIGHT  . TONSILLECTOMY  AGE 68 OR 4  . TOTAL SHOULDER ARTHROPLASTY Right 11/01/2018   Procedure: RIGHT SHOULDER REVISION TO REVERSE TOTAL SHOULDER;  Surgeon: Tania Ade, MD;  [facility]: [facility];  Service: Orthopedics;  Laterality: Right;  CHOICE ANESTHESIA WITH INTERSCALENE BLOCK EXPAREL, NEEDS RNFA    There were no vitals filed for this visit.  Subjective Assessment - 02/01/19 0817    Subjective  COVID 19 screening performed on patient upon arrival. Patient reported walking 1/16th more and had less intense ache but still had an ache.    Pertinent History  DM    Limitations  Standing;Walking;House hold activities    How long can you walk comfortably?  less than 1 mile    Diagnostic tests  x-ray: normal    Patient Stated Goals  get better and return to walking program    Currently in Pain?  No/denies         [facility] PT Assessment - 02/01/19 0001      Assessment   Medical Diagnosis  left knee pain and upper peroneal tendonitis, right greater trochanter  bursitis    Referring Provider (PT)  Frederik Pear, MD    Next MD Visit  2 months    Prior Therapy  no      Precautions   Precautions  None                   [facility] Adult PT Treatment/Exercise - 02/01/19 0001      Knee/Hip Exercises: Aerobic   Nustep  L6 x10 min no UEs      Knee/Hip Exercises: Machines for Strengthening   Cybex Knee Extension  20# 3x10 reps    Cybex Knee Flexion  50# 3x10 reps      Knee/Hip Exercises: Standing   Terminal Knee Extension  Strengthening;Left;3 sets;10 reps;Limitations    Terminal Knee Extension Limitations  Orange XTS    Walking with Sports Cord  B sides, backwards, forward x15 reps each with orange XTS      Knee/Hip Exercises: Seated   Clamshell with TheraBand  Green   x30 reps   Sit to Sand  without UE support;20 reps                   PT Long Term Goals - 02/01/19 0916      PT LONG TERM GOAL #1   Title  Patient will be independent with HEP and its progression    Time  6    Period  Weeks    Status  Achieved      PT LONG TERM GOAL #2   Title  Patient will demonstrate 5/5 left knee MMT to improve stability during functional tasks.    Time  6    Period  Weeks    Status  On-going      PT LONG TERM GOAL #3   Title  Patient will demonstrate 4+/5 or greater right hip MMT to improve stability during funtional tasks.    Time  6    Period  Weeks    Status  On-going      PT LONG TERM GOAL #4   Title  Patient will report ability to walk with right hip pain less than 3/10.    Time  6    Period  Weeks    Status  On-going      PT LONG TERM GOAL #5   Title  Patient will report ability to negotiate steps with a reciprocating gait pattern with pain less than 3/10 to safely access basement.     Baseline  6    Period  Weeks    Status  Achieved            Plan - 02/01/19 7846    Clinical Impression Statement  Patient presented in clinic with reports of continued but less ache in affected hip with walking and was able to ambulate a little farther until the ache started. Patient able to complete all exercises as directed with no complaints of pain. Patient feeling more confident with stairs at home per patient report. No modalities completed secondary to no pain.    Personal Factors and Comorbidities  Age;Comorbidity 1    Comorbidities  DM    Examination-Activity Limitations  Stairs;Stand    Stability/Clinical Decision Making  Stable/Uncomplicated    Rehab Potential  Good    PT Frequency  2x / week    PT Duration  6 weeks    PT Treatment/Interventions  ADLs/Self Care Home Management;Cryotherapy;Ultrasound;Moist Heat;Iontophoresis 30m/ml Dexamethasone;Electrical Stimulation;Stair training;Gait training;Therapeutic activities;Therapeutic exercise;Balance training;Neuromuscular re-education;Manual  techniques;Dry needling;Passive range  of motion;Vasopneumatic Device;Taping;Patient/family education    PT Next Visit Plan  Nustep, hip and knee strengthening, modalities PRN for pain relief. Iontophoresis per referral    PT Home Exercise Plan  see patient education section    Consulted and Agree with Plan of Care  Patient       Patient will benefit from skilled therapeutic intervention in order to improve the following deficits and impairments:  Pain, Decreased activity tolerance, Decreased range of motion, Decreased strength, Difficulty walking  Visit Diagnosis: Chronic pain of left knee  Pain in right hip     Problem List Patient Active Problem List   Diagnosis Date Noted  . H/O total shoulder replacement, right 11/01/2018  . Paroxysmal tachycardia ([place]) 01/11/2017  . Nonrheumatic aortic valve stenosis 01/11/2017  . Type 1 diabetes mellitus without complication ([place]) 63/84/6659  . Aortic atherosclerosis ([place]) 11/23/2016  . HNP (herniated nucleus pulposus), lumbar 10/06/2016  . Acute renal injury ([place]) 08/06/2016  . Diarrhea 08/06/2016  . Fever 08/06/2016  . Hyperbilirubinemia 08/06/2016  . Septic shock ([place]) 08/06/2016  . Lactic acidosis 08/06/2016  . Type 2 diabetes mellitus with hyperlipidemia ([place]) 01/18/2016  . Inguinal hernia 12/10/2015  . Right groin pain 11/30/2015  . Thrombocytopenia ([place]) 07/21/2014  . Bilateral carotid bruits 05/11/2014  . Vitamin D deficiency 09/17/2013  . BPH (benign prostatic hyperplasia) 05/22/2013  . 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 Diabetes type 2, controlled ([place]) 01/10/2013  . Essential hypertension, benign 01/10/2013  . OSA (obstructive sleep apnea) 05/16/2012  . Edema 02/21/2012  . At risk for coronary artery disease 03/20/2011  . Obesity 03/20/2011  . WEIGHT GAIN, ABNORMAL 11/09/2010  . Hyperlipidemia 06/22/2010  . ALLERGIC RHINITIS 06/22/2010  . ASTHMA 06/22/2010  . COUGH 06/22/2010    Standley Brooking,  PTA 02/01/2019, 9:16 AM  [facility] [address] [place], [place], [address] Phone: [phone]   Fax:  [phone]  Name: Tony Cantu MRN: [id number] Date of Birth: 16-Apr-1951

## 2019-02-03 ENCOUNTER — Encounter: Payer: Self-pay | Admitting: Family Medicine

## 2019-02-05 ENCOUNTER — Other Ambulatory Visit: Payer: Self-pay

## 2019-02-05 ENCOUNTER — Ambulatory Visit: Payer: BC Managed Care – PPO | Admitting: Physical Therapy

## 2019-02-05 ENCOUNTER — Encounter: Payer: Self-pay | Admitting: Physical Therapy

## 2019-02-05 DIAGNOSIS — M25562 Pain in left knee: Secondary | ICD-10-CM

## 2019-02-05 DIAGNOSIS — G8929 Other chronic pain: Secondary | ICD-10-CM | POA: Diagnosis not present

## 2019-02-05 DIAGNOSIS — M25551 Pain in right hip: Secondary | ICD-10-CM | POA: Diagnosis not present

## 2019-02-05 NOTE — Therapy (Signed)
[facility] [place], [place], [address] Phone: [phone]   Fax:  [phone]  Physical Therapy Treatment Progress Note Reporting Period 01/01/2019 to 02/05/2019  See note below for Objective Data and Assessment of Progress/Goals.  Patient is progressing well and goals are ongoing at this time.  Gabriela Eves, PT, DPT    Patient Details  Name: Tony Cantu MRN: [id number] Date of Birth: [birth year] Referring Provider (PT): Frederik Pear, MD   Encounter Date: 02/05/2019  PT End of Session - 02/05/19 0928    Visit Number  10    Number of Visits  12    Date for PT Re-Evaluation  02/26/19    PT Start Time  0913    PT Stop Time  0954    PT Time Calculation (min)  41 min    Activity Tolerance  Patient tolerated treatment well    Behavior During Therapy  [facility] for tasks assessed/performed       Past Medical History:  Diagnosis Date  . Arthritis   . Asthma   . BPH (benign prostatic hypertrophy)   . Colon polyps   . Diabetes mellitus without complication (HCC)    TYPE 1   . Gastritis   . GERD (gastroesophageal reflux disease)   . Headache    HX  . Hypertension   . PONV (postoperative nausea and vomiting)   . Seizures (HCC)    HYPOGLYCEMIC LAST 1 AND 1/2 YRS AGO  . Sleep apnea    uses CPAP nightly  . Testicle trouble    one testicle BORN WITH    Past Surgical History:  Procedure Laterality Date  . BACK SURGERY     94  LOWER   . CARPAL TUNNEL RELEASE Right 10/13/2015   Procedure: RIGHT CARPAL TUNNEL RELEASE;  Surgeon: Daryll Brod, MD;  Location: [place];  Service: Orthopedics;  Laterality: Right;  . CARPAL TUNNEL RELEASE Left 07/05/2016   Procedure: LEFT CARPAL TUNNEL RELEASE;  Surgeon: Daryll Brod, MD;  Location: [place];  Service: Orthopedics;  Laterality: Left;  . HERNIA REPAIR    . INGUINAL HERNIA REPAIR  2003   right   . KNEE ARTHROSCOPY Right 03/03/2016  . LUMBAR DISC  SURGERY  8/96   Dr. Coralyn Mark, discectomy  . LUMBAR LAMINECTOMY/DECOMPRESSION MICRODISCECTOMY Right 10/06/2016   Procedure: RIGHT LUMBAR THREE - LUMBAR FOUR  LAMINECTOMY, FORAMINOTOMY AND MICRODISCECTOMY;  Surgeon: Jovita Gamma, MD;  Location: [place];  Service: Neurosurgery;  Laterality: Right;  . SHOULDER SURGERY  11/28/05   left partial  . SHOULDER SURGERY  07/14/2006   RIGHT  . TONSILLECTOMY  AGE 36 OR 4  . TOTAL SHOULDER ARTHROPLASTY Right 11/01/2018   Procedure: RIGHT SHOULDER REVISION TO REVERSE TOTAL SHOULDER;  Surgeon: Tania Ade, MD;  Location: [facility];  Service: Orthopedics;  Laterality: Right;  CHOICE ANESTHESIA WITH INTERSCALENE BLOCK EXPAREL, NEEDS RNFA    There were no vitals filed for this visit.  Subjective Assessment - 02/05/19 0922    Subjective  COVID 19 screening performed on patient upon arrival. Patient reports he stepped in a small hole over the weekend and had pain down LLE some over the weekend.    Pertinent History  DM    Limitations  Standing;Walking;House hold activities    How long can you walk comfortably?  less than 1 mile    Diagnostic tests  x-ray: normal    Patient Stated Goals  get better and return to walking program  Currently in Pain?  Yes    Pain Score  --   "a little bit"   Pain Location  Knee    Pain Orientation  Left    Pain Descriptors / Indicators  Discomfort    Pain Type  Acute pain    Pain Onset  More than a month ago    Pain Frequency  Intermittent         [facility] PT Assessment - 02/05/19 0001      Assessment   Medical Diagnosis  left knee pain and upper peroneal tendonitis, right greater trochanter bursitis    Referring Provider (PT)  Frederik Pear, MD    Next MD Visit  2 months    Prior Therapy  no      Precautions   Precautions  None                   [facility] Adult PT Treatment/Exercise - 02/05/19 0001      Knee/Hip Exercises: Aerobic   Nustep  L5 x10 min LEs only      Knee/Hip Exercises: Machines for  Strengthening   Cybex Knee Extension  20# 3x10 reps    Cybex Knee Flexion  50# 3x10 reps    Cybex Leg Press  3pl, seat 6 x20 reps      Knee/Hip Exercises: Standing   Terminal Knee Extension  Strengthening;Left;3 sets;10 reps;Limitations    Terminal Knee Extension Limitations  Orange XTS    Lateral Step Up  Both;2 sets;10 reps;Hand Hold: 2;Step Height: 8"    Forward Step Up  Both;2 sets;10 reps;Hand Hold: 2;Step Height: 8"      Knee/Hip Exercises: Seated   Sit to Sand  without UE support;20 reps      Manual Therapy   Manual Therapy  Myofascial release    Myofascial Release  IASTW to L distal ITB to reduce muscle tightness                  PT Long Term Goals - 02/01/19 0916      PT LONG TERM GOAL #1   Title  Patient will be independent with HEP and its progression    Time  6    Period  Weeks    Status  Achieved      PT LONG TERM GOAL #2   Title  Patient will demonstrate 5/5 left knee MMT to improve stability during functional tasks.    Time  6    Period  Weeks    Status  On-going      PT LONG TERM GOAL #3   Title  Patient will demonstrate 4+/5 or greater right hip MMT to improve stability during funtional tasks.    Time  6    Period  Weeks    Status  On-going      PT LONG TERM GOAL #4   Title  Patient will report ability to walk with right hip pain less than 3/10.    Time  6    Period  Weeks    Status  On-going      PT LONG TERM GOAL #5   Title  Patient will report ability to negotiate steps with a reciprocating gait pattern with pain less than 3/10 to safely access basement.     Baseline  6    Period  Weeks    Status  Achieved            Plan - 02/05/19 1016    Clinical Impression  Statement  Patient presented in clinic with reports of intermittant L knee pain after accidentally stepping in a small hole over the weekend which caused LLE pain. Patient guided through LE strengthening exercises without complaints of pain. No abnormal compensatory  strategies noted through the therex session. IASTW completed to distal L ITB to reduce palpable muscle tightness. Moderate redness response noted following end of IASTW session. Patient educated regarding rationale for IASTW and educated regarding soreness that may present. Patient verbalized understanding of education.    Personal Factors and Comorbidities  Age;Comorbidity 1    Comorbidities  DM    Examination-Activity Limitations  Stairs;Stand    Stability/Clinical Decision Making  Stable/Uncomplicated    Rehab Potential  Good    PT Frequency  2x / week    PT Duration  6 weeks    PT Treatment/Interventions  ADLs/Self Care Home Management;Cryotherapy;Ultrasound;Moist Heat;Iontophoresis 40m/ml Dexamethasone;Electrical Stimulation;Stair training;Gait training;Therapeutic activities;Therapeutic exercise;Balance training;Neuromuscular re-education;Manual techniques;Dry needling;Passive range of motion;Vasopneumatic Device;Taping;Patient/family education    PT Next Visit Plan  Nustep, hip and knee strengthening, modalities PRN for pain relief. Iontophoresis per referral    PT Home Exercise Plan  see patient education section    Consulted and Agree with Plan of Care  Patient       Patient will benefit from skilled therapeutic intervention in order to improve the following deficits and impairments:  Pain, Decreased activity tolerance, Decreased range of motion, Decreased strength, Difficulty walking  Visit Diagnosis: Chronic pain of left knee  Pain in right hip     Problem List Patient Active Problem List   Diagnosis Date Noted  . H/O total shoulder replacement, right 11/01/2018  . Paroxysmal tachycardia ([facility] 01/11/2017  . Nonrheumatic aortic valve stenosis 01/11/2017  . Type 1 diabetes mellitus without complication ([facility] 016/38/4536 . Aortic atherosclerosis ([facility] 11/23/2016  . HNP (herniated nucleus pulposus), lumbar 10/06/2016  . Acute renal injury ([facility] 08/06/2016  . Diarrhea  08/06/2016  . Fever 08/06/2016  . Hyperbilirubinemia 08/06/2016  . Septic shock ([facility] [place] 08/06/2016  . Lactic acidosis 08/06/2016  . Type 2 diabetes mellitus with hyperlipidemia (HW[place] 01/18/2016  . Inguinal hernia 12/10/2015  . Right groin pain 11/30/2015  . Thrombocytopenia ([facility] 07/21/2014  . Bilateral carotid bruits 05/11/2014  . Vitamin D deficiency 09/17/2013  . BPH (benign prostatic hyperplasia) 05/22/2013  . 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 Diabetes type 2, controlled ([facility] 01/10/2013  . Essential hypertension, benign 01/10/2013  . OSA (obstructive sleep apnea) 05/16/2012  . Edema 02/21/2012  . At risk for coronary artery disease 03/20/2011  . Obesity 03/20/2011  . WEIGHT GAIN, ABNORMAL 11/09/2010  . Hyperlipidemia 06/22/2010  . ALLERGIC RHINITIS 06/22/2010  . ASTHMA 06/22/2010  . COUGH 06/22/2010    KStandley Brooking PTA 02/05/2019, 10:20 AM  [facility] [address]Phone: [phone]  Fax:  [phone] Name: Tony BOULAYMRN: [id number] of Birth: 2May 05, 1952

## 2019-02-08 ENCOUNTER — Ambulatory Visit: Payer: BC Managed Care – PPO | Admitting: Physical Therapy

## 2019-02-08 ENCOUNTER — Encounter: Payer: Self-pay | Admitting: Physical Therapy

## 2019-02-08 ENCOUNTER — Other Ambulatory Visit: Payer: Self-pay

## 2019-02-08 DIAGNOSIS — M25562 Pain in left knee: Secondary | ICD-10-CM | POA: Diagnosis not present

## 2019-02-08 DIAGNOSIS — M25551 Pain in right hip: Secondary | ICD-10-CM | POA: Diagnosis not present

## 2019-02-08 DIAGNOSIS — G8929 Other chronic pain: Secondary | ICD-10-CM

## 2019-02-08 NOTE — Therapy (Signed)
[facility] [place], [place], [address] Phone: [phone]   Fax:  [phone]  Physical Therapy Treatment  Patient Details  Name: Tony Cantu MRN: [id number] Date of Birth: 06/14/1951 Referring Provider (PT): Frederik Pear, MD   Encounter Date: 02/08/2019  PT End of Session - 02/08/19 0816    Visit Number  11    Number of Visits  12    Date for PT Re-Evaluation  02/26/19    PT Start Time  0809    PT Stop Time  0852    PT Time Calculation (min)  43 min    Activity Tolerance  Patient tolerated treatment well    Behavior During Therapy  [facility] for tasks assessed/performed       Past Medical History:  Diagnosis Date  . Arthritis   . Asthma   . BPH (benign prostatic hypertrophy)   . Colon polyps   . Diabetes mellitus without complication (HCC)    TYPE 1   . Gastritis   . GERD (gastroesophageal reflux disease)   . Headache    HX  . Hypertension   . PONV (postoperative nausea and vomiting)   . Seizures (HCC)    HYPOGLYCEMIC LAST 1 AND 1/2 YRS AGO  . Sleep apnea    uses CPAP nightly  . Testicle trouble    one testicle BORN WITH    Past Surgical History:  Procedure Laterality Date  . BACK SURGERY     89  LOWER   . CARPAL TUNNEL RELEASE Right 10/13/2015   Procedure: RIGHT CARPAL TUNNEL RELEASE;  Surgeon: Daryll Brod, MD;  Location: [place];  Service: Orthopedics;  Laterality: Right;  . CARPAL TUNNEL RELEASE Left 07/05/2016   Procedure: LEFT CARPAL TUNNEL RELEASE;  Surgeon: Daryll Brod, MD;  Location: [place];  Service: Orthopedics;  Laterality: Left;  . HERNIA REPAIR    . INGUINAL HERNIA REPAIR  2003   right   . KNEE ARTHROSCOPY Right 03/03/2016  . LUMBAR DISC SURGERY  8/96   Dr. Coralyn Mark, discectomy  . LUMBAR LAMINECTOMY/DECOMPRESSION MICRODISCECTOMY Right 10/06/2016   Procedure: RIGHT LUMBAR THREE - LUMBAR FOUR  LAMINECTOMY, FORAMINOTOMY AND MICRODISCECTOMY;  Surgeon: Jovita Gamma, MD;  Location: [place];  Service: Neurosurgery;  Laterality: Right;  . SHOULDER SURGERY  11/28/05   left partial  . SHOULDER SURGERY  07/14/2006   RIGHT  . TONSILLECTOMY  AGE 67 OR 4  . TOTAL SHOULDER ARTHROPLASTY Right 11/01/2018   Procedure: RIGHT SHOULDER REVISION TO REVERSE TOTAL SHOULDER;  Surgeon: Tania Ade, MD;  [facility];  Service: Orthopedics;  Laterality: Right;  CHOICE ANESTHESIA WITH INTERSCALENE BLOCK EXPAREL, NEEDS RNFA    There were no vitals filed for this visit.  Subjective Assessment - 02/08/19 0814    Subjective  COVID 19 screening performed on patient upon arrival. Reports that he still has some L knee pain.    Pertinent History  DM    Limitations  Standing;Walking;House hold activities    How long can you walk comfortably?  less than 1 mile    Diagnostic tests  x-ray: normal    Patient Stated Goals  get better and return to walking program    Currently in Pain?  Yes    Pain Score  3     Pain Location  Knee    Pain Orientation  Left    Pain Descriptors / Indicators  Discomfort    Pain Type  Acute pain  Pain Onset  More than a month ago    Pain Frequency  Intermittent         [facility] PT Assessment - 02/08/19 0001      Assessment   Medical Diagnosis  left knee pain and upper peroneal tendonitis, right greater trochanter bursitis    Referring Provider (PT)  Frederik Pear, MD    Next MD Visit  2 months    Prior Therapy  no      Precautions   Precautions  None                   [facility] Adult PT Treatment/Exercise - 02/08/19 0001      Knee/Hip Exercises: Aerobic   Nustep  L5 x10 min LEs only      Knee/Hip Exercises: Machines for Strengthening   Cybex Knee Extension  20# 3x10 reps    Cybex Knee Flexion  50# 3x10 reps    Cybex Leg Press  3pl, seat 7 x20 reps      Knee/Hip Exercises: Standing   Terminal Knee Extension  Strengthening;Left;3 sets;10 reps;Limitations    Theraband Level (Terminal Knee Extension)  Other (comment)     Terminal Knee Extension Limitations  Orange XTS    Lateral Step Up  Both;2 sets;10 reps;Hand Hold: 2;Step Height: 8"    Forward Step Up  Both;2 sets;10 reps;Hand Hold: 2;Step Height: 8"      Knee/Hip Exercises: Seated   Clamshell with TheraBand  Green   x20 reps   Sit to [employer]  without UE support;20 reps      Manual Therapy   Manual Therapy  Myofascial release    Myofascial Release  IASTW to L distal ITB to reduce muscle tightness                  PT Long Term Goals - 02/01/19 0916      PT LONG TERM GOAL #1   Title  Patient will be independent with HEP and its progression    Time  6    Period  Weeks    Status  Achieved      PT LONG TERM GOAL #2   Title  Patient will demonstrate 5/5 left knee MMT to improve stability during functional tasks.    Time  6    Period  Weeks    Status  On-going      PT LONG TERM GOAL #3   Title  Patient will demonstrate 4+/5 or greater right hip MMT to improve stability during funtional tasks.    Time  6    Period  Weeks    Status  On-going      PT LONG TERM GOAL #4   Title  Patient will report ability to walk with right hip pain less than 3/10.    Time  6    Period  Weeks    Status  On-going      PT LONG TERM GOAL #5   Title  Patient will report ability to negotiate steps with a reciprocating gait pattern with pain less than 3/10 to safely access basement.     Baseline  6    Period  Weeks    Status  Achieved            Plan - 02/08/19 0900    Clinical Impression Statement  Patient presented in clinic with reports of intermittant L knee pain. Patient able to complete all therex with only reports of weakness in L knee with  lateral step ups. Patient continues to have tightness of the L ITB and was particularly more tender to palpation at the posterior fibers of L ITB. IASTW completed to L mid to distal ITB with moderate redness response.    Personal Factors and Comorbidities  Age;Comorbidity 1    Examination-Activity  Limitations  Stairs;Stand    Stability/Clinical Decision Making  Stable/Uncomplicated    Rehab Potential  Good    PT Frequency  2x / week    PT Duration  6 weeks    PT Treatment/Interventions  ADLs/Self Care Home Management;Cryotherapy;Ultrasound;Moist Heat;Iontophoresis 34m/ml Dexamethasone;Electrical Stimulation;Stair training;Gait training;Therapeutic activities;Therapeutic exercise;Balance training;Neuromuscular re-education;Manual techniques;Dry needling;Passive range of motion;Vasopneumatic Device;Taping;Patient/family education    PT Next Visit Plan  Nustep, hip and knee strengthening, modalities PRN for pain relief. Iontophoresis per referral    PT Home Exercise Plan  see patient education section    Consulted and Agree with Plan of Care  Patient       Patient will benefit from skilled therapeutic intervention in order to improve the following deficits and impairments:  Pain, Decreased activity tolerance, Decreased range of motion, Decreased strength, Difficulty walking  Visit Diagnosis: Chronic pain of left knee   Pain in right hip     Problem List Patient Active Problem List   Diagnosis Date Noted  . H/O total shoulder replacement, right 11/01/2018  . Paroxysmal tachycardia (HL[place] 01/11/2017  . Nonrheumatic aortic valve stenosis 01/11/2017  . Type 1 diabetes mellitus without complication (HBr[place] 034/91/7915 . Aortic atherosclerosis (HL[place] 11/23/2016  . HNP (herniated nucleus pulposus), lumbar 10/06/2016  . Acute renal injury (HS[facility] Dom[facility] 08/06/2016  . Diarrhea 08/06/2016  . Fever 08/06/2016  . Hyperbilirubinemia 08/06/2016  . Septic shock (HBill[place] 08/06/2016  . Lactic acidosis 08/06/2016  . Type 2 diabetes mellitus with hyperlipidemia (HL[place] 01/18/2016  . Inguinal hernia 12/10/2015  . Right groin pain 11/30/2015  . Thrombocytopenia (HAnderson 07/21/2014  . Bilateral carotid bruits 05/11/2014  . Vitamin D deficiency 09/17/2013  . BPH (benign prostatic hyperplasia) 05/22/2013  . Low  serum testosterone level 02/18/2013  . Diabetes type 2, controlled (H[place] 01/10/2013  . Essential hypertension, benign 01/10/2013  . OSA (obstructive sleep apnea) 05/16/2012  . Edema 02/21/2012  . At risk for coronary artery disease 03/20/2011  . Obesity 03/20/2011  . WEIGHT GAIN, ABNORMAL 11/09/2010  . Hyperlipidemia 06/22/2010  . ALLERGIC RHINITIS 06/22/2010  . ASTHMA 06/22/2010  . COUGH 06/22/2010    KStandley Brooking PTA 02/08/2019, 9:03 AM  [facility] [place] [address]Phone: [phone]  Fax:  [phone] Name: Tony JENTZMRN: [id number] of Birth: [birth year]

## 2019-02-12 ENCOUNTER — Other Ambulatory Visit: Payer: Self-pay

## 2019-02-12 ENCOUNTER — Ambulatory Visit: Payer: BC Managed Care – PPO | Admitting: Physical Therapy

## 2019-02-12 DIAGNOSIS — G8929 Other chronic pain: Secondary | ICD-10-CM

## 2019-02-12 DIAGNOSIS — M25551 Pain in right hip: Secondary | ICD-10-CM | POA: Diagnosis not present

## 2019-02-12 DIAGNOSIS — M25562 Pain in left knee: Secondary | ICD-10-CM | POA: Diagnosis not present

## 2019-02-12 NOTE — Patient Instructions (Signed)
[facility]  DRY NEEDLING CONSENT FORM   Trigger point dry needling is a physical therapy approach to treat Myofascial Pain and Dysfunction.  Dry Needling (DN) is a valuable and effective way to deactivate myofascial trigger points (muscle knots/pain). It is skilled intervention that uses a thin filiform needle to penetrate the skin and stimulate underlying myofascial trigger points, muscular, and connective tissues for the management of neuromusculoskeletal pain and movement impairments.  A local twitch response (LTR) will be elicited.  This can sometimes feel like a deep ache in the muscle during the procedure. Multiple trigger points in multiple muscles can be treated during each treatment.  No medication of any kind is injected.   As with any medical treatment and procedure, there are possible adverse events.  While significant adverse events are uncommon, they do sometimes occur and must be considered prior to giving consent.  1. Dry needling often causes a "post needling soreness".  There can be an increase in pain from a couple of hours to 2-3 days, followed by an improvement in the overall pain state. 2. Any time a needle is used there is a risk of infection.  However, we are using new, sterile, and disposable needles; infections are extremely rare. 3. There is a possibility that you may bleed or bruise.  You may feel tired and some nausea following treatment. 4. There is a rare possibility of a pneumothorax (air in the chest cavity). 5. Allergic reaction to nickel in the stainless steel needle. 6. If a nerve is touched, it may cause paresthesia (a prickling/shock sensation) which is usually brief, but may continue for a couple of days.  Following treatment stay hydrated.  Continue regular activities but not too vigorous initially after treatment for 24-48 hours.  Dry Needling is best when combined with other physical therapy interventions such as  strengthening, stretching and other therapeutic modalities.   PLEASE ANSWER THE FOLLOWING QUESTIONS:  Do you have a lack of sensation?   Y/N  Do you have a phobia or fear of needles  Y/N  Are you pregnant?    Y/N If yes:  How many weeks? __________ Do you have any implanted devices?  Y/N If yes:  Pacemaker/Spinal Cord Stimulator/Deep Brain Stimulator/Insulin Pump/Other: ________________ Do you have any implants?  Y/N If yes: Breast/Facial/Pecs/Buttocks/Calves/Hip  Replacement/ Knee Replacement/Other: _________ Do you take any blood thinners?   Y/N If yes: Coumadin (Warfarin)/Other: ___________________ Do you have a bleeding disorder?   Y/N If yes: What kind: _________________________________ Do you take any immunosuppressants?  Y/N If yes:   What kind: _________________________________ Do you take anti-inflammatories?   Y/N If yes: What kind: Advil/Aspirin/Other: ________________ Have you ever been diagnosed with Scoliosis? Y/N Have you had back surgery?   Y/N If yes:  Laminectomy/Fusion/Other: ___________________   I have read, or had read to me, the above.  I have had the opportunity to ask any questions.  All of my questions have been answered to my satisfaction and I understand the risks involved with dry needling.  I consent to examination and treatment at [facility], including dry needling, of any and all of my involved and affected muscles.

## 2019-02-12 NOTE — Therapy (Addendum)
[facility] [place], [place], [address] Phone: [phone]   Fax:  [phone]  Physical Therapy Treatment  Patient Details  Name: Tony Cantu MRN: [id number] Date of Birth: 1951-05-21 Referring Provider (PT): Frederik Pear, MD   Encounter Date: 02/12/2019  PT End of Session - 02/12/19 1008    Visit Number  12    Number of Visits  18    Date for PT Re-Evaluation  03/05/19    PT Start Time  0815    PT Stop Time  0920    PT Time Calculation (min)  65 min       Past Medical History:  Diagnosis Date  . Arthritis   . Asthma   . BPH (benign prostatic hypertrophy)   . Colon polyps   . Diabetes mellitus without complication (HCC)    TYPE 1   . Gastritis   . GERD (gastroesophageal reflux disease)   . Headache    HX  . Hypertension   . PONV (postoperative nausea and vomiting)   . Seizures (HCC)    HYPOGLYCEMIC LAST 1 AND 1/2 YRS AGO  . Sleep apnea    uses CPAP nightly  . Testicle trouble    one testicle BORN WITH    Past Surgical History:  Procedure Laterality Date  . BACK SURGERY     94  LOWER   . CARPAL TUNNEL RELEASE Right 10/13/2015   Procedure: RIGHT CARPAL TUNNEL RELEASE;  Surgeon: Daryll Brod, MD;  Location: [place];  Service: Orthopedics;  Laterality: Right;  . CARPAL TUNNEL RELEASE Left 07/05/2016   Procedure: LEFT CARPAL TUNNEL RELEASE;  Surgeon: Daryll Brod, MD;  Location: [place];  Service: Orthopedics;  Laterality: Left;  . HERNIA REPAIR    . INGUINAL HERNIA REPAIR  2003   right   . KNEE ARTHROSCOPY Right 03/03/2016  . LUMBAR DISC SURGERY  8/96   Dr. Coralyn Mark, discectomy  . LUMBAR LAMINECTOMY/DECOMPRESSION MICRODISCECTOMY Right 10/06/2016   Procedure: RIGHT LUMBAR THREE - LUMBAR FOUR  LAMINECTOMY, FORAMINOTOMY AND MICRODISCECTOMY;  Surgeon: Jovita Gamma, MD;  Location: [place];  Service: Neurosurgery;  Laterality: Right;  . SHOULDER SURGERY  11/28/05   left partial   . SHOULDER SURGERY  07/14/2006   RIGHT  . TONSILLECTOMY  AGE 43 OR 4  . TOTAL SHOULDER ARTHROPLASTY Right 11/01/2018   Procedure: RIGHT SHOULDER REVISION TO REVERSE TOTAL SHOULDER;  Surgeon: Tania Ade, MD;  Location: [facility];  Service: Orthopedics;  Laterality: Right;  CHOICE ANESTHESIA WITH INTERSCALENE BLOCK EXPAREL, NEEDS RNFA    There were no vitals filed for this visit.  Subjective Assessment - 02/12/19 1005    Subjective  COVID-19 screen performed prior to patient entering clinic.  Patient's CC is right hip pain which he states prohibits him from walking which he would like to do.    Patient Stated Goals  get better and return to walking program    Currently in Pain?  Yes    Pain Score  3     Pain Location  Knee    Pain Orientation  Left    Pain Onset  More than a month ago    Multiple Pain Sites  --    Pain Score  5    Pain Location  Hip    Pain Orientation  Right    Pain Descriptors / Indicators  Aching  [facility] Adult PT Treatment/Exercise - 02/12/19 0001      Exercises   Exercises  Knee/Hip      Knee/Hip Exercises: Aerobic   Nustep  [place] 5 x 10 minutes.      Knee/Hip Exercises: Machines for Strengthening   Cybex Knee Extension  20# x 3 minutes.    Cybex Knee Flexion  40# x 3 minutes.      Modalities   Modalities  Electrical Stimulation;Ultrasound      Acupuncturist Location  Right TFL/glut med region.    Electrical Stimulation Action  Pre-mod.    Electrical Stimulation Parameters  80-150 Hz x 10 minutes.    Electrical Stimulation Goals  Pain      Ultrasound   Ultrasound Location  Right lateral hip.    Ultrasound Parameters  Combo e'stim/U/S at 1.50 W/CM2 x 12 minutes    Ultrasound Goals  Pain      Manual Therapy   Manual Therapy  Soft tissue mobilization    Soft tissue mobilization  STW/M x 11 minutes to left TFL and glut med to reduce tone.                  PT Long  Term Goals - 02/01/19 0916      PT LONG TERM GOAL #1   Title  Patient will be independent with HEP and its progression    Time  6    Period  Weeks    Status  Achieved      PT LONG TERM GOAL #2   Title  Patient will demonstrate 5/5 left knee MMT to improve stability during functional tasks.    Time  6    Period  Weeks    Status  On-going      PT LONG TERM GOAL #3   Title  Patient will demonstrate 4+/5 or greater right hip MMT to improve stability during funtional tasks.    Time  6    Period  Weeks    Status  On-going      PT LONG TERM GOAL #4   Title  Patient will report ability to walk with right hip pain less than 3/10.    Time  6    Period  Weeks    Status  On-going      PT LONG TERM GOAL #5   Title  Patient will report ability to negotiate steps with a reciprocating gait pattern with pain less than 3/10 to safely access basement.     Baseline  6    Period  Weeks    Status  Achieved            Plan - 02/12/19 1009    Clinical Impression Statement  Patient was very tender to palpation over his right TFL and glut medius.  He is wanting to try dry needling.  He was provided with information today and a consent form.    Stability/Clinical Decision Making  Stable/Uncomplicated    PT Treatment/Interventions  ADLs/Self Care Home Management;Cryotherapy;Ultrasound;Moist Heat;Iontophoresis 58m/ml Dexamethasone;Electrical Stimulation;Stair training;Gait training;Therapeutic activities;Therapeutic exercise;Balance training;Neuromuscular re-education;Manual techniques;Dry needling;Passive range of motion;Vasopneumatic Device;Taping;Patient/family education    PT Next Visit Plan  Nustep, hip and knee strengthening, modalities PRN for pain relief. Iontophoresis per referral    PT Home Exercise Plan  see patient education section    Consulted and Agree with Plan of Care  Patient       Patient will benefit from skilled therapeutic intervention in order to  improve the following  deficits and impairments:     Visit Diagnosis: 1. Chronic pain of left knee   2. Pain in right hip        Problem List Patient Active Problem List   Diagnosis Date Noted  . H/O total shoulder replacement, right 11/01/2018  . Paroxysmal tachycardia ([place]) 01/11/2017  . Nonrheumatic aortic valve stenosis 01/11/2017  . Type 1 diabetes mellitus without complication ([place]) 44/62/8638  . Aortic atherosclerosis ([place]) 11/23/2016  . HNP (herniated nucleus pulposus), lumbar 10/06/2016  . Acute renal injury ([place]) 08/06/2016  . Diarrhea 08/06/2016  . Fever 08/06/2016  . Hyperbilirubinemia 08/06/2016  . Septic shock ([place]) 08/06/2016  . Lactic acidosis 08/06/2016  . Type 2 diabetes mellitus with hyperlipidemia ([place]) 01/18/2016  . Inguinal hernia 12/10/2015  . Right groin pain 11/30/2015  . Thrombocytopenia ([place]) 07/21/2014  . Bilateral carotid bruits 05/11/2014  . Vitamin D deficiency 09/17/2013  . BPH (benign prostatic hyperplasia) 05/22/2013  . 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 Diabetes type 2, controlled ([place]) 01/10/2013  . Essential hypertension, benign 01/10/2013  . OSA (obstructive sleep apnea) 05/16/2012  . Edema 02/21/2012  . At risk for coronary artery disease 03/20/2011  . Obesity 03/20/2011  . WEIGHT GAIN, ABNORMAL 11/09/2010  . Hyperlipidemia 06/22/2010  . ALLERGIC RHINITIS 06/22/2010  . ASTHMA 06/22/2010  . COUGH 06/22/2010    Carmeline Kowal, [place] MPT 02/12/2019, 11:01 AM  [facility] [place], [place], [address] Phone: [phone]   Fax:  [phone]  Name: Tony Cantu MRN: [id number] Date of Birth: March 17, 1951

## 2019-02-15 ENCOUNTER — Ambulatory Visit: Payer: BC Managed Care – PPO | Admitting: Physical Therapy

## 2019-02-15 ENCOUNTER — Other Ambulatory Visit: Payer: Self-pay

## 2019-02-15 ENCOUNTER — Encounter: Payer: Self-pay | Admitting: Physical Therapy

## 2019-02-15 DIAGNOSIS — G8929 Other chronic pain: Secondary | ICD-10-CM | POA: Diagnosis not present

## 2019-02-15 DIAGNOSIS — M25551 Pain in right hip: Secondary | ICD-10-CM | POA: Diagnosis not present

## 2019-02-15 DIAGNOSIS — M25562 Pain in left knee: Secondary | ICD-10-CM | POA: Diagnosis not present

## 2019-02-15 NOTE — Therapy (Signed)
[facility] [place], [place], [address] Phone: [phone]   Fax:  [phone]  Physical Therapy Treatment  Patient Details  Name: Tony Cantu MRN: [id number] Date of Birth: 08/21/1951 Referring Provider (PT): Frederik Pear, MD   Encounter Date: 02/15/2019  PT End of Session - 02/15/19 0950    Visit Number  13    Number of Visits  18    Date for PT Re-Evaluation  03/05/19    PT Start Time  0817    PT Stop Time  0915    PT Time Calculation (min)  58 min       Past Medical History:  Diagnosis Date  . Arthritis   . Asthma   . BPH (benign prostatic hypertrophy)   . Colon polyps   . Diabetes mellitus without complication (HCC)    TYPE 1   . Gastritis   . GERD (gastroesophageal reflux disease)   . Headache    HX  . Hypertension   . PONV (postoperative nausea and vomiting)   . Seizures (HCC)    HYPOGLYCEMIC LAST 1 AND 1/2 YRS AGO  . Sleep apnea    uses CPAP nightly  . Testicle trouble    one testicle BORN WITH    Past Surgical History:  Procedure Laterality Date  . BACK SURGERY     94  LOWER   . CARPAL TUNNEL RELEASE Right 10/13/2015   Procedure: RIGHT CARPAL TUNNEL RELEASE;  Surgeon: Daryll Brod, MD;  Location: [place];  Service: Orthopedics;  Laterality: Right;  . CARPAL TUNNEL RELEASE Left 07/05/2016   Procedure: LEFT CARPAL TUNNEL RELEASE;  Surgeon: Daryll Brod, MD;  Location: [place];  Service: Orthopedics;  Laterality: Left;  . HERNIA REPAIR    . INGUINAL HERNIA REPAIR  2003   right   . KNEE ARTHROSCOPY Right 03/03/2016  . LUMBAR DISC SURGERY  8/96   Dr. Coralyn Mark, discectomy  . LUMBAR LAMINECTOMY/DECOMPRESSION MICRODISCECTOMY Right 10/06/2016   Procedure: RIGHT LUMBAR THREE - LUMBAR FOUR  LAMINECTOMY, FORAMINOTOMY AND MICRODISCECTOMY;  Surgeon: Jovita Gamma, MD;  Location: [place];  Service: Neurosurgery;  Laterality: Right;  . SHOULDER SURGERY  11/28/05   left partial   . SHOULDER SURGERY  07/14/2006   RIGHT  . TONSILLECTOMY  AGE 68 OR 4  . TOTAL SHOULDER ARTHROPLASTY Right 11/01/2018   Procedure: RIGHT SHOULDER REVISION TO REVERSE TOTAL SHOULDER;  Surgeon: Tania Ade, MD;  [facility];  Service: Orthopedics;  Laterality: Right;  CHOICE ANESTHESIA WITH INTERSCALENE BLOCK EXPAREL, NEEDS RNFA    There were no vitals filed for this visit.  Subjective Assessment - 02/15/19 0945    Subjective  COVID-19 screen performed prior to patient entering clinic.  Continued c/o right hip pain.    Patient Stated Goals  get better and return to walking program    Currently in Pain?  Yes    Pain Score  3     Pain Location  Knee    Pain Orientation  Left    Pain Descriptors / Indicators  Discomfort    Pain Type  Acute pain    Pain Onset  More than a month ago    Pain Score  5    Pain Location  Hip    Pain Orientation  Right    Pain Descriptors / Indicators  Aching  [facility] Adult PT Treatment/Exercise - 02/15/19 0001      Exercises   Exercises  Knee/Hip      Knee/Hip Exercises: Aerobic   Recumbent Bike  Level 3 x 4 minutes.    Nustep  Level 4 x 6 minutes.      Modalities   Modalities  Electrical Stimulation;Moist Heat      Moist Heat Therapy   Number Minutes Moist Heat  20 Minutes    Moist Heat Location  --   Right lateral hip.     Acupuncturist Location  RT TFL/Glut med.    Electrical Stimulation Action  Pre-mod.    Electrical Stimulation Parameters  80-150 Hz x 20 minutes.    Electrical Stimulation Goals  Pain      Ultrasound   Ultrasound Location  --   RT lateral hip.   Ultrasound Parameters  U/S at 1.50 W/CM2 x 8 minutes.    Ultrasound Goals  Pain      Manual Therapy   Manual Therapy  Soft tissue mobilization    Soft tissue mobilization  STW/M x 5 minutes including ischemic release technique to left glut med and TFL.       Trigger Point Dry Needling - 02/15/19  0001    Consent Given?  Yes    Education Handout Provided  Yes    Muscles Treated Back/Hip  Gluteus medius;Tensor fascia lata    Gluteus Medius Response  Twitch response elicited    Tensor Fascia Lata Response  Twitch response elicited                PT Long Term Goals - 02/01/19 0916      PT LONG TERM GOAL #1   Title  Patient will be independent with HEP and its progression    Time  6    Period  Weeks    Status  Achieved      PT LONG TERM GOAL #2   Title  Patient will demonstrate 5/5 left knee MMT to improve stability during functional tasks.    Time  6    Period  Weeks    Status  On-going      PT LONG TERM GOAL #3   Title  Patient will demonstrate 4+/5 or greater right hip MMT to improve stability during funtional tasks.    Time  6    Period  Weeks    Status  On-going      PT LONG TERM GOAL #4   Title  Patient will report ability to walk with right hip pain less than 3/10.    Time  6    Period  Weeks    Status  On-going      PT LONG TERM GOAL #5   Title  Patient will report ability to negotiate steps with a reciprocating gait pattern with pain less than 3/10 to safely access basement.     Baseline  6    Period  Weeks    Status  Achieved            Plan - 02/15/19 0957    Clinical Impression Statement  Patient did very well wiht dry needling to his right hip today.  His right TFL was very palpable tender prior to treatment.       Patient will benefit from skilled therapeutic intervention in order to improve the following deficits and impairments:     Visit Diagnosis: 1. Chronic pain of left knee  2. Pain in right hip        Problem List Patient Active Problem List   Diagnosis Date Noted  . H/O total shoulder replacement, right 11/01/2018  . Paroxysmal tachycardia ([place]) 01/11/2017  . Nonrheumatic aortic valve stenosis 01/11/2017  . Type 1 diabetes mellitus without complication ([place]) 13/68/5992  . Aortic atherosclerosis ([place]) 11/23/2016   . HNP (herniated nucleus pulposus), lumbar 10/06/2016  . Acute renal injury ([place]) 08/06/2016  . Diarrhea 08/06/2016  . Fever 08/06/2016  . Hyperbilirubinemia 08/06/2016  . Septic shock ([place]) 08/06/2016  . Lactic acidosis 08/06/2016  . Type 2 diabetes mellitus with hyperlipidemia ([place]) 01/18/2016  . Inguinal hernia 12/10/2015  . Right groin pain 11/30/2015  . Thrombocytopenia ([place]) 07/21/2014  . Bilateral carotid bruits 05/11/2014  . Vitamin D deficiency 09/17/2013  . BPH (benign prostatic hyperplasia) 05/22/2013  . 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 Diabetes type 2, controlled ([place]) 01/10/2013  . Essential hypertension, benign 01/10/2013  . OSA (obstructive sleep apnea) 05/16/2012  . Edema 02/21/2012  . At risk for coronary artery disease 03/20/2011  . Obesity 03/20/2011  . WEIGHT GAIN, ABNORMAL 11/09/2010  . Hyperlipidemia 06/22/2010  . ALLERGIC RHINITIS 06/22/2010  . ASTHMA 06/22/2010  . COUGH 06/22/2010    Severina Sykora, [place] MPT 02/15/2019, 10:01 AM  [facility] [address] [place], [place], [address] Phone: [phone]   Fax:  [phone]  Name: Tony Cantu MRN: [id number] Date of Birth: [birth year]

## 2019-02-19 ENCOUNTER — Other Ambulatory Visit: Payer: Self-pay

## 2019-02-19 ENCOUNTER — Ambulatory Visit: Payer: BC Managed Care – PPO | Admitting: Physical Therapy

## 2019-02-19 DIAGNOSIS — G8929 Other chronic pain: Secondary | ICD-10-CM

## 2019-02-19 DIAGNOSIS — M25562 Pain in left knee: Secondary | ICD-10-CM

## 2019-02-19 DIAGNOSIS — M25551 Pain in right hip: Secondary | ICD-10-CM | POA: Diagnosis not present

## 2019-02-19 DIAGNOSIS — E108 Type 1 diabetes mellitus with unspecified complications: Secondary | ICD-10-CM | POA: Diagnosis not present

## 2019-02-19 NOTE — Therapy (Signed)
[facility] [place], [place], [address] Phone: [phone]   Fax:  [phone]  Physical Therapy Treatment  Patient Details  Name: Tony Cantu MRN: [id number] Date of Birth: [birth year] Referring Provider (PT): Frederik Pear, MD   Encounter Date: 02/19/2019  PT End of Session - 02/19/19 1111    Visit Number  14    Number of Visits  18    Date for PT Re-Evaluation  03/05/19    PT Start Time  0814    PT Stop Time  0918    PT Time Calculation (min)  64 min    Behavior During Therapy  [facility] for tasks assessed/performed       Past Medical History:  Diagnosis Date  . Arthritis   . Asthma   . BPH (benign prostatic hypertrophy)   . Colon polyps   . Diabetes mellitus without complication (HCC)    TYPE 1   . Gastritis   . GERD (gastroesophageal reflux disease)   . Headache    HX  . Hypertension   . PONV (postoperative nausea and vomiting)   . Seizures (HCC)    HYPOGLYCEMIC LAST 1 AND 1/2 YRS AGO  . Sleep apnea    uses CPAP nightly  . Testicle trouble    one testicle BORN WITH    Past Surgical History:  Procedure Laterality Date  . BACK SURGERY     94  LOWER   . CARPAL TUNNEL RELEASE Right 10/13/2015   Procedure: RIGHT CARPAL TUNNEL RELEASE;  Surgeon: Daryll Brod, MD;  Location: [place];  Service: Orthopedics;  Laterality: Right;  . CARPAL TUNNEL RELEASE Left 07/05/2016   Procedure: LEFT CARPAL TUNNEL RELEASE;  Surgeon: Daryll Brod, MD;  Location: [place];  Service: Orthopedics;  Laterality: Left;  . HERNIA REPAIR    . INGUINAL HERNIA REPAIR  2003   right   . KNEE ARTHROSCOPY Right 03/03/2016  . LUMBAR DISC SURGERY  8/96   Dr. Coralyn Mark, discectomy  . LUMBAR LAMINECTOMY/DECOMPRESSION MICRODISCECTOMY Right 10/06/2016   Procedure: RIGHT LUMBAR THREE - LUMBAR FOUR  LAMINECTOMY, FORAMINOTOMY AND MICRODISCECTOMY;  Surgeon: Jovita Gamma, MD;  Location: [place];  Service: Neurosurgery;   Laterality: Right;  . SHOULDER SURGERY  11/28/05   left partial  . SHOULDER SURGERY  07/14/2006   RIGHT  . TONSILLECTOMY  AGE 52 OR 4  . TOTAL SHOULDER ARTHROPLASTY Right 11/01/2018   Procedure: RIGHT SHOULDER REVISION TO REVERSE TOTAL SHOULDER;  Surgeon: Tania Ade, MD;  [facility];  Service: Orthopedics;  Laterality: Right;  CHOICE ANESTHESIA WITH INTERSCALENE BLOCK EXPAREL, NEEDS RNFA    There were no vitals filed for this visit.  Subjective Assessment - 02/19/19 1107    Subjective  COVID-19 screen performed prior to patient entering clinic.  Would like to try dry needling again.    Limitations  Standing;Walking;House hold activities    How long can you walk comfortably?  less than 1 mile    Patient Stated Goals  get better and return to walking program                       [facility] Adult PT Treatment/Exercise - 02/19/19 0001      Modalities   Modalities  Electrical Stimulation;Ultrasound      Electrical Stimulation   Electrical Stimulation Location  RT TFL/Glu med region.    Electrical Stimulation Action  Pre-mod.    Electrical Stimulation Parameters  80-150 Hz  x 20 minutes.    Electrical Stimulation Goals  Pain      Ultrasound   Ultrasound Location  RT lateral thigh.    Ultrasound Parameters  Combo e'stim/U/S at 1.50 W/CM2 x 12 minutes.    Ultrasound Goals  Pain      Manual Therapy   Manual Therapy  Soft tissue mobilization    Soft tissue mobilization  STW/M x 12 minutes (6 minutes to right lateral hip and 6 minutes to left ITB).       Trigger Point Dry Needling - 02/19/19 0001    Consent Given?  Yes    Muscles Treated Back/Hip  Gluteus medius;Tensor fascia lata    Gluteus Medius Response  Twitch response elicited    Tensor Fascia Lata Response  Twitch response elicited                PT Long Term Goals - 02/01/19 0916      PT LONG TERM GOAL #1   Title  Patient will be independent with HEP and its progression    Time  6     Period  Weeks    Status  Achieved      PT LONG TERM GOAL #2   Title  Patient will demonstrate 5/5 left knee MMT to improve stability during functional tasks.    Time  6    Period  Weeks    Status  On-going      PT LONG TERM GOAL #3   Title  Patient will demonstrate 4+/5 or greater right hip MMT to improve stability during funtional tasks.    Time  6    Period  Weeks    Status  On-going      PT LONG TERM GOAL #4   Title  Patient will report ability to walk with right hip pain less than 3/10.    Time  6    Period  Weeks    Status  On-going      PT LONG TERM GOAL #5   Title  Patient will report ability to negotiate steps with a reciprocating gait pattern with pain less than 3/10 to safely access basement.     Baseline  6    Period  Weeks    Status  Achieved            Plan - 02/19/19 1110    Clinical Impression Statement  Patient did very well with treatment today and felt he might try to walk a half mile today.    PT Treatment/Interventions  ADLs/Self Care Home Management;Cryotherapy;Ultrasound;Moist Heat;Iontophoresis 26m/ml Dexamethasone;Electrical Stimulation;Stair training;Gait training;Therapeutic activities;Therapeutic exercise;Balance training;Neuromuscular re-education;Manual techniques;Dry needling;Passive range of motion;Vasopneumatic Device;Taping;Patient/family education    PT Next Visit Plan  Nustep, hip and knee strengthening, modalities PRN for pain relief. Iontophoresis per referral    PT Home Exercise Plan  see patient education section       Patient will benefit from skilled therapeutic intervention in order to improve the following deficits and impairments:  Pain, Decreased activity tolerance, Decreased range of motion, Decreased strength, Difficulty walking  Visit Diagnosis: 1. Chronic pain of left knee   2. Pain in right hip        Problem List Patient Active Problem List   Diagnosis Date Noted  . H/O total shoulder replacement, right  11/01/2018  . Paroxysmal tachycardia ([place] 01/11/2017  . Nonrheumatic aortic valve stenosis 01/11/2017  . Type 1 diabetes mellitus without complication ([place] 062/22/9798 . Aortic atherosclerosis ([place] 11/23/2016  .  HNP (herniated nucleus pulposus), lumbar 10/06/2016  . Acute renal injury ([place]) 08/06/2016  . Diarrhea 08/06/2016  . Fever 08/06/2016  . Hyperbilirubinemia 08/06/2016  . Septic shock ([place]) 08/06/2016  . Lactic acidosis 08/06/2016  . Type 2 diabetes mellitus with hyperlipidemia ([place]) 01/18/2016  . Inguinal hernia 12/10/2015  . Right groin pain 11/30/2015  . Thrombocytopenia ([place]) 07/21/2014  . Bilateral carotid bruits 05/11/2014  . Vitamin D deficiency 09/17/2013  . BPH (benign prostatic hyperplasia) 05/22/2013  . 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 Diabetes type 2, controlled ([place]) 01/10/2013  . Essential hypertension, benign 01/10/2013  . OSA (obstructive sleep apnea) 05/16/2012  . Edema 02/21/2012  . At risk for coronary artery disease 03/20/2011  . Obesity 03/20/2011  . WEIGHT GAIN, ABNORMAL 11/09/2010  . Hyperlipidemia 06/22/2010  . ALLERGIC RHINITIS 06/22/2010  . ASTHMA 06/22/2010  . COUGH 06/22/2010    Tony Cantu, [place] MPT 02/19/2019, 11:13 AM  [facility] [place], [place], [address] Phone: [phone]   Fax:  [phone]  Name: Tony Cantu MRN: [id number] Date of Birth: 1951/01/04

## 2019-02-20 ENCOUNTER — Other Ambulatory Visit: Payer: Self-pay

## 2019-02-20 ENCOUNTER — Ambulatory Visit (HOSPITAL_COMMUNITY)
Admission: RE | Admit: 2019-02-20 | Discharge: 2019-02-20 | Disposition: A | Discharge status: Home / Self Care | Payer: BC Managed Care – PPO | Source: Ambulatory Visit | Attending: Family Medicine | Admitting: Family Medicine

## 2019-02-20 DIAGNOSIS — I10 Essential (primary) hypertension: Secondary | ICD-10-CM | POA: Diagnosis not present

## 2019-02-20 DIAGNOSIS — E782 Mixed hyperlipidemia: Secondary | ICD-10-CM

## 2019-02-20 DIAGNOSIS — Z122 Encounter for screening for malignant neoplasm of respiratory organs: Secondary | ICD-10-CM | POA: Insufficient documentation

## 2019-02-20 DIAGNOSIS — I700 Atherosclerosis of aorta: Secondary | ICD-10-CM | POA: Diagnosis not present

## 2019-02-20 DIAGNOSIS — Z136 Encounter for screening for cardiovascular disorders: Secondary | ICD-10-CM | POA: Diagnosis not present

## 2019-02-20 DIAGNOSIS — Z87891 Personal history of nicotine dependence: Secondary | ICD-10-CM | POA: Diagnosis not present

## 2019-02-21 DIAGNOSIS — Z794 Long term (current) use of insulin: Secondary | ICD-10-CM | POA: Diagnosis not present

## 2019-02-21 DIAGNOSIS — E108 Type 1 diabetes mellitus with unspecified complications: Secondary | ICD-10-CM | POA: Diagnosis not present

## 2019-02-22 ENCOUNTER — Encounter: Payer: Self-pay | Admitting: Physical Therapy

## 2019-02-22 ENCOUNTER — Other Ambulatory Visit: Payer: Self-pay

## 2019-02-22 ENCOUNTER — Ambulatory Visit: Payer: BC Managed Care – PPO | Admitting: Physical Therapy

## 2019-02-22 DIAGNOSIS — M25551 Pain in right hip: Secondary | ICD-10-CM

## 2019-02-22 DIAGNOSIS — G8929 Other chronic pain: Secondary | ICD-10-CM | POA: Diagnosis not present

## 2019-02-22 DIAGNOSIS — M25562 Pain in left knee: Secondary | ICD-10-CM | POA: Diagnosis not present

## 2019-02-22 NOTE — Therapy (Addendum)
[facility] [place], [place], [address] Phone: [phone]   Fax:  [phone]  Physical Therapy Treatment  Patient Details  Name: Tony Cantu MRN: [id number] Date of Birth: 1951/08/16 Referring Provider (PT): Frederik Pear, MD   Encounter Date: 02/22/2019  PT End of Session - 02/22/19 0920    Visit Number  15    Number of Visits  18    Date for PT Re-Evaluation  03/05/19    PT Start Time  0815    PT Stop Time  0925    PT Time Calculation (min)  70 min    Activity Tolerance  Patient tolerated treatment well    Behavior During Therapy  [facility] for tasks assessed/performed       Past Medical History:  Diagnosis Date  . Arthritis   . Asthma   . BPH (benign prostatic hypertrophy)   . Colon polyps   . Diabetes mellitus without complication (HCC)    TYPE 1   . Gastritis   . GERD (gastroesophageal reflux disease)   . Headache    HX  . Hypertension   . PONV (postoperative nausea and vomiting)   . Seizures (HCC)    HYPOGLYCEMIC LAST 1 AND 1/2 YRS AGO  . Sleep apnea    uses CPAP nightly  . Testicle trouble    one testicle BORN WITH    Past Surgical History:  Procedure Laterality Date  . BACK SURGERY     94  LOWER   . CARPAL TUNNEL RELEASE Right 10/13/2015   Procedure: RIGHT CARPAL TUNNEL RELEASE;  Surgeon: Daryll Brod, MD;  Location: [place];  Service: Orthopedics;  Laterality: Right;  . CARPAL TUNNEL RELEASE Left 07/05/2016   Procedure: LEFT CARPAL TUNNEL RELEASE;  Surgeon: Daryll Brod, MD;  Location: [place];  Service: Orthopedics;  Laterality: Left;  . HERNIA REPAIR    . INGUINAL HERNIA REPAIR  2003   right   . KNEE ARTHROSCOPY Right 03/03/2016  . LUMBAR DISC SURGERY  8/96   Dr. Coralyn Mark, discectomy  . LUMBAR LAMINECTOMY/DECOMPRESSION MICRODISCECTOMY Right 10/06/2016   Procedure: RIGHT LUMBAR THREE - LUMBAR FOUR  LAMINECTOMY, FORAMINOTOMY AND MICRODISCECTOMY;  Surgeon: Jovita Gamma, MD;  Location: [place];  Service: Neurosurgery;  Laterality: Right;  . SHOULDER SURGERY  11/28/05   left partial  . SHOULDER SURGERY  07/14/2006   RIGHT  . TONSILLECTOMY  AGE 67 OR 4  . TOTAL SHOULDER ARTHROPLASTY Right 11/01/2018   Procedure: RIGHT SHOULDER REVISION TO REVERSE TOTAL SHOULDER;  Surgeon: Tania Ade, MD;  Location[facility];  Service: Orthopedics;  Laterality: Right;  CHOICE ANESTHESIA WITH INTERSCALENE BLOCK EXPAREL, NEEDS RNFA    There were no vitals filed for this visit.  Subjective Assessment - 02/22/19 0918    Subjective  COVID-19 screen performed prior to patient entering clinic.  I wallked almost a half mile after my last visit.    Limitations  Standing;Walking;House hold activities    How long can you walk comfortably?  less than 1 mile    Diagnostic tests  x-ray: normal    Patient Stated Goals  get better and return to walking program    Currently in Pain?  Yes    Pain Score  2     Pain Location  Knee    Pain Orientation  Left    Pain Descriptors / Indicators  Discomfort    Pain Type  Acute pain    Pain Onset  More  than a month ago    Pain Score  3    Pain Location  Hip    Pain Orientation  Right    Pain Descriptors / Indicators  Aching                       [facility] Adult PT Treatment/Exercise - 02/22/19 0001      Electrical Stimulation   Electrical Stimulation Location  RT lateral hip.    Electrical Stimulation Action  Pre-mod.    Electrical Stimulation Parameters  80-150 Hz x 20 minutes.    Electrical Stimulation Goals  Pain      Ultrasound   Ultrasound Location  RT lateral thigh.    Ultrasound Parameters  Combo e'stim/U/S at 1.50 W/CM2 x 12 minutes.    Ultrasound Goals  Pain      Manual Therapy   Manual Therapy  Soft tissue mobilization    Soft tissue mobilization  STW/M x 6 minutes to left lateral hip musculature to reduce tone and IASTM x 6 minutes to right ITB (12 minutes).       Trigger Point Dry Needling - 02/22/19  0001    Consent Given?  Yes    Muscles Treated Back/Hip  Gluteus medius;Tensor fascia lata    Gluteus Medius Response  Twitch response elicited    Tensor Fascia Lata Response  Twitch response elicited                PT Long Term Goals - 02/01/19 0916      PT LONG TERM GOAL #1   Title  Patient will be independent with HEP and its progression    Time  6    Period  Weeks    Status  Achieved      PT LONG TERM GOAL #2   Title  Patient will demonstrate 5/5 left knee MMT to improve stability during functional tasks.    Time  6    Period  Weeks    Status  On-going      PT LONG TERM GOAL #3   Title  Patient will demonstrate 4+/5 or greater right hip MMT to improve stability during funtional tasks.    Time  6    Period  Weeks    Status  On-going      PT LONG TERM GOAL #4   Title  Patient will report ability to walk with right hip pain less than 3/10.    Time  6    Period  Weeks    Status  On-going      PT LONG TERM GOAL #5   Title  Patient will report ability to negotiate steps with a reciprocating gait pattern with pain less than 3/10 to safely access basement.     Baseline  6    Period  Weeks    Status  Achieved            Plan - 02/22/19 4132    Clinical Impression Statement  Excellent response to treatment with a significant reduction in right hip pain and able to walk.  In fact, he reported no pain with walking (as before) but more of of a stiffness.    Stability/Clinical Decision Making  Stable/Uncomplicated    PT Treatment/Interventions  ADLs/Self Care Home Management;Cryotherapy;Ultrasound;Moist Heat;Iontophoresis 44m/ml Dexamethasone;Electrical Stimulation;Stair training;Gait training;Therapeutic activities;Therapeutic exercise;Balance training;Neuromuscular re-education;Manual techniques;Dry needling;Passive range of motion;Vasopneumatic Device;Taping;Patient/family education    PT Next Visit Plan  Nustep, hip and knee strengthening, modalities PRN for pain  relief. Iontophoresis per referral    PT Home Exercise Plan  see patient education section       Patient will benefit from skilled therapeutic intervention in order to improve the following deficits and impairments:  Pain, Decreased activity tolerance, Decreased range of motion, Decreased strength, Difficulty walking  Visit Diagnosis: 1. Chronic pain of left knee   2. Pain in right hip        Problem List Patient Active Problem List   Diagnosis Date Noted  . H/O total shoulder replacement, right 11/01/2018  . Paroxysmal tachycardia ([place]) 01/11/2017  . Nonrheumatic aortic valve stenosis 01/11/2017  . Type 1 diabetes mellitus without complication ([place]) 60/63/0160  . Aortic atherosclerosis ([place]) 11/23/2016  . HNP (herniated nucleus pulposus), lumbar 10/06/2016  . Acute renal injury ([place]) 08/06/2016  . Diarrhea 08/06/2016  . Fever 08/06/2016  . Hyperbilirubinemia 08/06/2016  . Septic shock ([place]) 08/06/2016  . Lactic acidosis 08/06/2016  . Type 2 diabetes mellitus with hyperlipidemia ([place]) 01/18/2016  . Inguinal hernia 12/10/2015  . Right groin pain 11/30/2015  . Thrombocytopenia ([place]) 07/21/2014  . Bilateral carotid bruits 05/11/2014  . Vitamin D deficiency 09/17/2013  . BPH (benign prostatic hyperplasia) 05/22/2013  . 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 Diabetes type 2, controlled ([place]) 01/10/2013  . Essential hypertension, benign 01/10/2013  . OSA (obstructive sleep apnea) 05/16/2012  . Edema 02/21/2012  . At risk for coronary artery disease 03/20/2011  . Obesity 03/20/2011  . WEIGHT GAIN, ABNORMAL 11/09/2010  . Hyperlipidemia 06/22/2010  . ALLERGIC RHINITIS 06/22/2010  . ASTHMA 06/22/2010  . COUGH 06/22/2010    Florencia Zaccaro, [place] MPT 02/22/2019, 9:38 AM  [facility] [address] [place], [place], [address] Phone: [phone]   Fax:  [phone]  Name: KOHEI ANTONELLIS MRN: [id number] Date of Birth: 18-Mar-1951

## 2019-02-24 ENCOUNTER — Encounter: Payer: Self-pay | Admitting: Family Medicine

## 2019-02-25 ENCOUNTER — Encounter: Payer: Self-pay | Admitting: *Deleted

## 2019-02-25 ENCOUNTER — Other Ambulatory Visit: Payer: Self-pay | Admitting: *Deleted

## 2019-02-25 DIAGNOSIS — N289 Disorder of kidney and ureter, unspecified: Secondary | ICD-10-CM

## 2019-02-26 ENCOUNTER — Ambulatory Visit: Payer: BC Managed Care – PPO | Admitting: Physical Therapy

## 2019-02-26 ENCOUNTER — Ambulatory Visit (INDEPENDENT_AMBULATORY_CARE_PROVIDER_SITE_OTHER): Payer: BC Managed Care – PPO | Admitting: Internal Medicine

## 2019-02-26 ENCOUNTER — Other Ambulatory Visit: Payer: Self-pay

## 2019-02-26 ENCOUNTER — Encounter: Payer: Self-pay | Admitting: Internal Medicine

## 2019-02-26 VITALS — Ht 70.0 in | Wt 255.0 lb

## 2019-02-26 DIAGNOSIS — Z86010 Personal history of colonic polyps: Secondary | ICD-10-CM | POA: Diagnosis not present

## 2019-02-26 DIAGNOSIS — R748 Abnormal levels of other serum enzymes: Secondary | ICD-10-CM

## 2019-02-26 DIAGNOSIS — G8929 Other chronic pain: Secondary | ICD-10-CM | POA: Diagnosis not present

## 2019-02-26 DIAGNOSIS — M25551 Pain in right hip: Secondary | ICD-10-CM | POA: Diagnosis not present

## 2019-02-26 DIAGNOSIS — M25562 Pain in left knee: Secondary | ICD-10-CM

## 2019-02-26 DIAGNOSIS — K760 Fatty (change of) liver, not elsewhere classified: Secondary | ICD-10-CM

## 2019-02-26 DIAGNOSIS — K7460 Unspecified cirrhosis of liver: Secondary | ICD-10-CM | POA: Diagnosis not present

## 2019-02-26 MED ORDER — SUPREP BOWEL PREP KIT 17.5-3.13-1.6 GM/177ML PO SOLN: 1.0000 | ORAL | 0 refills | Status: DC

## 2019-02-26 NOTE — Addendum Note (Signed)
Addended by: Larina Bras on: 02/26/2019 04:10 PM   Modules accepted: Orders

## 2019-02-26 NOTE — Patient Instructions (Addendum)
Lab work as follows: CBC, CMP, INR, hepatitis C antibody, hepatitis B surface antigen and surface antibody, hepatitis B core total, ceruloplasmin, alpha-1 antitrypsin, ferritin, IBC, anti-smooth muscle antibody, antimitochondrial antibody, ANA and IgG (this can be completed at [place] call them before going to ask about timing)  You have been scheduled for an MRI at [facility] on 03/04/2019. Your appointment time is 9:00 am. Please arrive 15 minutes prior to your appointment time for registration purposes. Please make certain not to have anything to eat or drink 4 hours prior to your test. In addition, if you have any metal in your body, have a pacemaker or defibrillator, please be sure to let your ordering physician know. This test typically takes 45 minutes to 1 hour to complete. Should you need to reschedule, please call [phone] to do so.  You have been scheduled for an endoscopy and colonoscopy. Please follow the written instructions given to you at your visit today. Please pick up your prep supplies at the pharmacy within the next 1-3 days. If you use inhalers (even only as needed), please bring them with you on the day of your procedure.

## 2019-02-26 NOTE — Progress Notes (Signed)
Patient ID: Tony Cantu, male   DOB: 08/19/1951, 68 y.o.   MRN: [id number]  This service was provided via telemedicine.  Doximity app with A/V communication The patient was located at home The provider was located in provider's GI office. The patient did consent to this telephone visit and is aware of possible charges through their insurance for this visit.   The persons participating in this telemedicine service were the patient and I. Time spent on call: 25 minutes   HPI: Tony Cantu is a 68 year old male with a past medical history of cirrhosis 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 Laurance Flatten to discuss elevated liver enzymes, his cirrhosis and consider colon polyp surveillance colonoscopy.  He is seen by the Doximity app with A/V communication today in the setting of COVID-19.  He reports that he saw Dr. Amedeo Plenty at [facility] in the past but it is been several years since Dr. Amedeo Plenty left the practice.  He was told that he had cirrhosis but that it was not really a problem for him and that it was likely due to fatty liver.  He reports that he feels well.  He denies abdominal pain.  He does not have jaundice, itching, bleeding.  No blood in his stool or melena.  No trouble with abdominal swelling or lower extremity swelling.  He does have issues with his weight and he reports truncal obesity.  No trouble swallowing, significant heartburn, nausea or vomiting.  No issues with diarrhea or constipation.  Bowel movements he reports are regular.  He does not have a history of excessive alcohol use.  He reports he did drink as a younger man but this was many years ago.  He was never a daily alcohol drinker.  He denies drinking to excess.  There is no family history of liver disease.  He did smoke a pipe but has been quit for many years.  He  has had type 1 diabetes since 1994.  He denies hypertension and reports that he was placed on a blood pressure medication for kidney protection.  He does take a statin for hyperlipidemia.  He recalls years ago a gastric emptying scan and was told he had mild gastroparesis.  He was on a medication for a short time but this was "removed from the market".  He has a history of colon polyps and recalls on the colonoscopy prior to the last he had 3 polyps removed.  His last colonoscopy performed by Dr. Amedeo Plenty was in 5 years ago yesterday in June 2015.  This showed left-sided diverticulosis but no polyps on that day.  He does recall a more remote upper endoscopy.  His last liver imaging which revealed the concern for cirrhosis was over 2 years ago.  Past Medical History:  Diagnosis Date  . Arthritis   . Asthma   . BPH (benign prostatic hypertrophy)   . Colon polyps   . Diabetes mellitus without complication (HCC)    TYPE 1   . Gastritis   . GERD (gastroesophageal reflux disease)   . Headache    HX  . Hypertension   . PONV (postoperative nausea and vomiting)   . Seizures (HCC)    HYPOGLYCEMIC LAST 1 AND 1/2 YRS AGO  . Sleep apnea    uses CPAP nightly  . Testicle trouble    one  testicle BORN WITH    Past Surgical History:  Procedure Laterality Date  . BACK SURGERY     94  LOWER   . CARPAL TUNNEL RELEASE Right 10/13/2015   Procedure: RIGHT CARPAL TUNNEL RELEASE;  Surgeon: Daryll Brod, MD;  Location: [place];  Service: Orthopedics;  Laterality: Right;  . CARPAL TUNNEL RELEASE Left 07/05/2016   Procedure: LEFT CARPAL TUNNEL RELEASE;  Surgeon: Daryll Brod, MD;  Location: [place];  Service: Orthopedics;  Laterality: Left;  . HERNIA REPAIR    . INGUINAL HERNIA REPAIR  2003   right   . KNEE ARTHROSCOPY Right 03/03/2016  . LUMBAR DISC SURGERY  8/96   Dr. Coralyn Mark, discectomy  . LUMBAR LAMINECTOMY/DECOMPRESSION MICRODISCECTOMY Right 10/06/2016   Procedure: RIGHT  LUMBAR THREE - LUMBAR FOUR  LAMINECTOMY, FORAMINOTOMY AND MICRODISCECTOMY;  Surgeon: Jovita Gamma, MD;  Location: [place];  Service: Neurosurgery;  Laterality: Right;  . SHOULDER SURGERY  11/28/05   left partial  . SHOULDER SURGERY  07/14/2006   RIGHT  . TONSILLECTOMY  AGE 45 OR 4  . TOTAL SHOULDER ARTHROPLASTY Right 11/01/2018   Procedure: RIGHT SHOULDER REVISION TO REVERSE TOTAL SHOULDER;  Surgeon: Tania Ade, MD;  [facility];  Service: Orthopedics;  Laterality: Right;  CHOICE ANESTHESIA WITH INTERSCALENE BLOCK EXPAREL, NEEDS RNFA    Outpatient Medications Prior to Visit  Medication Sig Dispense Refill  . albuterol (PROVENTIL HFA;VENTOLIN HFA) 108 (90 Base) MCG/ACT inhaler Inhale 2 puffs into the lungs every 6 (six) hours as needed for wheezing or shortness of breath. 3 Inhaler 1  . aspirin 81 MG chewable tablet Chew by mouth daily.    Marland Kitchen atorvastatin (LIPITOR) 40 MG tablet TAKE 1 TABLET BY MOUTH  DAILY 90 tablet 0  . b complex vitamins tablet Take 1 tablet by mouth daily at 12 noon.     . Calcium Carbonate-Vitamin D 600-400 MG-UNIT tablet Take 1 tablet by mouth daily.    . Cholecalciferol (VITAMIN D3) 2000 UNITS capsule Take 2,000 Units by mouth daily at 12 noon.     . Continuous Blood Gluc Receiver (FREESTYLE LIBRE READER) DEVI 1 applicator by Does not apply route as directed. 1 Device 1  . Continuous Blood Gluc Sensor (FREESTYLE LIBRE SENSOR SYSTEM) MISC Check BS eight (8) times a day. Dx E10.9 3 each 3  . fexofenadine (ALLEGRA) 180 MG tablet Take 1 tablet (180 mg total) by mouth daily with breakfast. 90 tablet 3  . Flaxseed, Linseed, (FLAX SEED OIL PO) Take 1,300 mg by mouth daily with breakfast.     . furosemide (LASIX) 20 MG tablet TAKE 1 AND 1/2 TABLETS BY  MOUTH DAILY 135 tablet 0  . GLUCAGON EMERGENCY 1 MG injection INJECT 1MG INTO SKIN ONCE  AS NEEDED (Patient taking differently: Inject 1 mg into the skin once as needed (low blood sugar). ) 3 each 1  .  Glucosamine-Chondroit-Vit C-Mn (GLUCOSAMINE 1500 COMPLEX) CAPS Take 2 capsules by mouth daily at 12 noon.     Marland Kitchen HUMALOG 100 UNIT/ML injection INJECT 20 TO 50 UNITS  SUBCUTANEOUSLY 3 TIMES  DAILY BEFORE MEALS PER SLIDING SCALE (Patient taking differently: Inject into the skin every 2 (two) hours. Sliding scale) 140 mL 2  . montelukast (SINGULAIR) 10 MG tablet TAKE 1 TABLET BY MOUTH AT  BEDTIME 90 tablet 0  . multivitamin-lutein (OCUVITE-LUTEIN) CAPS capsule Take 1 capsule by mouth daily at 12 noon.    . pantoprazole (PROTONIX) 40 MG tablet TAKE 1 TABLET BY MOUTH  DAILY 90 tablet 0  . sildenafil (REVATIO) 20 MG tablet TAKE 2-5 TABLETS AS NEEDED PRIOR TO SEXUAL ACTIVITY 50 tablet 1  . Testosterone 30 MG/ACT SOLN USE 1 PUMP UNDER EACH  AXILLA DAILY (TOTAL OF 60MG DAILY) 270 mL 1  . TOUJEO MAX SOLOSTAR 300 UNIT/ML SOPN INJECT SUBCUTANEOUSLY 120  UNITS DAILY 30 mL 0  . valsartan (DIOVAN) 160 MG tablet TAKE 1 TABLET BY MOUTH  DAILY 90 tablet 0   No facility-administered medications prior to visit.     Allergies  Allergen Reactions  . Erythromycin Diarrhea  . Sulfonamide Derivatives Diarrhea    Family History  Problem Relation Age of Onset  . Heart failure Mother   . Heart attack Mother        in her 77's  . Alzheimer's disease Mother   . Diabetes Mother   . Asthma Father   . Suicidality Father        in his 79's  . Allergic rhinitis Sister   . Heart failure Maternal Grandmother   . Rheum arthritis Maternal Grandmother   . Breast cancer Maternal Grandmother   . Heart attack Maternal Grandmother        in her 75's  . Colon cancer Neg Hx   . Esophageal cancer Neg Hx   . Pancreatic cancer Neg Hx   . Liver disease Neg Hx     Social History   Tobacco Use  . Smoking status: Former Smoker    Years: 34.00    Types: Cigarettes, Pipe  . Smokeless tobacco: Never Used  . Tobacco comment: quit 2005 smoked cigarettes for 5 yrs prior to pipe use  Substance Use Topics  . Alcohol use: No     Comment: once a year  . Drug use: No    ROS: As per history of present illness, otherwise negative  Ht 5' 10"  (1.778 m)   Wt 255 lb (115.7 kg)   BMI 36.59 kg/m  Constitutional: Well-developed and well-nourished. No distress. HEENT: Normocephalic and atraumatic. Oropharynx is clear and moist. Conjunctivae are normal.  No scleral icterus. Neck: Neck supple. Trachea midline. Cardiovascular: Normal rate, regular rhythm and intact distal pulses. No M/R/G Pulmonary/chest: Effort normal and breath sounds normal. No wheezing, rales or rhonchi. Abdominal: Soft, nontender, nondistended. Bowel sounds active throughout. There are no masses palpable. No hepatosplenomegaly. Extremities: no clubbing, cyanosis, or edema Neurological: Alert and oriented to person place and time. Skin: Skin is warm and dry.  Psychiatric: Normal mood and affect. Behavior is normal.  RELEVANT LABS AND IMAGING: CBC    Component Value Date/Time   WBC 6.0 01/15/2019 0807   WBC 11.3 (H) 11/02/2018 0457   RBC 4.75 01/15/2019 0807   RBC 4.43 11/02/2018 0457   HGB 15.7 01/15/2019 0807   HCT 47.3 01/15/2019 0807   PLT 170 01/15/2019 0807   MCV 100 (H) 01/15/2019 0807   MCH 33.1 (H) 01/15/2019 0807   MCH 32.3 11/02/2018 0457   MCHC 33.2 01/15/2019 0807   MCHC 31.0 11/02/2018 0457   RDW 13.4 01/15/2019 0807   LYMPHSABS 1.5 01/15/2019 0807   MONOABS 1.2 (H) 10/25/2018 1000   EOSABS 0.2 01/15/2019 0807   BASOSABS 0.1 01/15/2019 0807    CMP     Component Value Date/Time   NA 140 01/16/2019 1432   K 4.6 01/16/2019 1432   CL 100 01/16/2019 1432   CO2 23 01/16/2019 1432   GLUCOSE 114 (H) 01/16/2019 1432   GLUCOSE 132 (H) 11/02/2018 0457  BUN 13 01/16/2019 1432   CREATININE 1.03 01/16/2019 1432   CREATININE 0.82 01/07/2013 0840   CALCIUM 9.8 01/16/2019 1432   PROT 7.0 01/15/2019 0807   ALBUMIN 4.2 01/15/2019 0807   AST 52 (H) 01/15/2019 0807   ALT 45 (H) 01/15/2019 0807   ALKPHOS 65 01/15/2019 0807    BILITOT 0.7 01/15/2019 0807   GFRNONAA 74 01/16/2019 1432   GFRNONAA >89 01/07/2013 0840   GFRAA 85 01/16/2019 1432   GFRAA >89 01/07/2013 0840    ASSESSMENT/PLAN: 68 year old male with a past medical history of cirrhosis 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 Laurance Flatten to discuss elevated liver enzymes, his cirrhosis and consider colon polyp surveillance colonoscopy.  1.  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  He likely does have underlying liver fibrosis and quite possibly cirrhosis.  The most likely cause would be nonalcoholic fatty liver disease but I have recommended that we perform some additional lab work to rule out other causes.  His liver enzymes are very mildly elevated in the mid 40s to low 50s.  His bilirubin is normal.  His alkaline phosphatase is normal.  His platelets were slightly low several months ago but more recently normal at 170,000.  I do not have a recent INR.  I do not have a suspicion for advanced liver disease or decompensated cirrhosis.  No definite evidence of portal hypertension.  I recommended we proceed as follows: --MRI abdomen with contrast to evaluate liver and rule out radiographic evidence of portal hypertension.  We discussed the need for [facility] screening and cirrhosis and we will perform MRI at this time and then likely start ultrasound every 6 months in 1 year (he request that this be performed in [place] if possible) --EGD recommended to exclude varices; we discussed the risk, benefits and alternatives and he is agreeable and wishes to proceed --Lab work as follows CBC, CMP, INR, hepatitis C antibody, hepatitis B surface antigen and surface antibody, hepatitis B core total, ceruloplasmin, alpha-1 antitrypsin, ferritin, IBC, anti-smooth muscle  antibody, antimitochondrial antibody, ANA and IgG (please see if this can be drawn at [place] otherwise he can drive to [place]) --We will discuss vaccines at follow-up depending on lab results --No evidence of hepatic encephalopathy --No evidence of volume overload ascites or lower extremity edema  2.  History of adenomatous polyps --surveillance colonoscopy is due at this time.  We discussed the risk, benefits and alternatives and he is agreeable and wishes to proceed. --Schedule colonoscopy in the [facility] on the same day as upper endoscopy as above    Cc:Chipper Herb, [place] [address] [place],  [place] [address]

## 2019-02-26 NOTE — Therapy (Signed)
[facility] [place], [place], [address] Phone: [phone]   Fax:  [phone]  Physical Therapy Treatment  Patient Details  Name: Tony Cantu MRN: [id number] Date of Birth: 06/07/1951 Referring Provider (PT): Frederik Pear, MD   Encounter Date: 02/26/2019  PT End of Session - 02/26/19 6962    Visit Number  16    Number of Visits  18    Date for PT Re-Evaluation  03/05/19    PT Start Time  1115    PT Stop Time  1209    PT Time Calculation (min)  54 min    Activity Tolerance  Patient tolerated treatment well    Behavior During Therapy  [facility] for tasks assessed/performed       Past Medical History:  Diagnosis Date  . Arthritis   . Asthma   . BPH (benign prostatic hypertrophy)   . Colon polyps   . Diabetes mellitus without complication (HCC)    TYPE 1   . Gastritis   . GERD (gastroesophageal reflux disease)   . Headache    HX  . Hypertension   . PONV (postoperative nausea and vomiting)   . Seizures (HCC)    HYPOGLYCEMIC LAST 1 AND 1/2 YRS AGO  . Sleep apnea    uses CPAP nightly  . Testicle trouble    one testicle BORN WITH    Past Surgical History:  Procedure Laterality Date  . BACK SURGERY     94  LOWER   . CARPAL TUNNEL RELEASE Right 10/13/2015   Procedure: RIGHT CARPAL TUNNEL RELEASE;  Surgeon: Daryll Brod, MD;  Location: [place];  Service: Orthopedics;  Laterality: Right;  . CARPAL TUNNEL RELEASE Left 07/05/2016   Procedure: LEFT CARPAL TUNNEL RELEASE;  Surgeon: Daryll Brod, MD;  Location: [place];  Service: Orthopedics;  Laterality: Left;  . HERNIA REPAIR    . INGUINAL HERNIA REPAIR  2003   right   . KNEE ARTHROSCOPY Right 03/03/2016  . LUMBAR DISC SURGERY  8/96   Dr. Coralyn Mark, discectomy  . LUMBAR LAMINECTOMY/DECOMPRESSION MICRODISCECTOMY Right 10/06/2016   Procedure: RIGHT LUMBAR THREE - LUMBAR FOUR  LAMINECTOMY, FORAMINOTOMY AND MICRODISCECTOMY;  Surgeon: Jovita Gamma, MD;  Location: [place];  Service: Neurosurgery;  Laterality: Right;  . SHOULDER SURGERY  11/28/05   left partial  . SHOULDER SURGERY  07/14/2006   RIGHT  . TONSILLECTOMY  AGE 68 OR 68  . TOTAL SHOULDER ARTHROPLASTY Right 11/01/2018   Procedure: RIGHT SHOULDER REVISION TO REVERSE TOTAL SHOULDER;  Surgeon: Tania Ade, MD;  [facility];  Service: Orthopedics;  Laterality: Right;  CHOICE ANESTHESIA WITH INTERSCALENE BLOCK EXPAREL, NEEDS RNFA    There were no vitals filed for this visit.                    [facility] Adult PT Treatment/Exercise - 02/26/19 0001      Modalities   Modalities  Electrical Stimulation;Moist Heat      Moist Heat Therapy   Number Minutes Moist Heat  15 Minutes    Moist Heat Location  --   Right lateral hip.     Acupuncturist Location  RT lateral hip.    Electrical Stimulation Action  Pre-mod.    Electrical Stimulation Parameters  80-150 Hz x 15 minutes.      Ultrasound   Ultrasound Location  RT lateral hip.    Ultrasound Parameters  Combo e'stim/U/S at 1.50  W/CM2 x 12 minutes.    Ultrasound Goals  Pain      Manual Therapy   Manual Therapy  Soft tissue mobilization    Soft tissue mobilization  STW/M x 12 minutes to left TFL/Glut med to reduce tone.       Trigger Point Dry Needling - 02/26/19 0001    Consent Given?  Yes    Muscles Treated Back/Hip  Gluteus medius;Tensor fascia lata    Gluteus Medius Response  Twitch response elicited    Tensor Fascia Lata Response  Twitch response elicited                PT Long Term Goals - 02/01/19 0916      PT LONG TERM GOAL #1   Title  Patient will be independent with HEP and its progression    Time  6    Period  Weeks    Status  Achieved      PT LONG TERM GOAL #2   Title  Patient will demonstrate 5/5 left knee MMT to improve stability during functional tasks.    Time  6    Period  Weeks    Status  On-going      PT LONG TERM GOAL #3    Title  Patient will demonstrate 4+/5 or greater right hip MMT to improve stability during funtional tasks.    Time  6    Period  Weeks    Status  On-going      PT LONG TERM GOAL #4   Title  Patient will report ability to walk with right hip pain less than 3/10.    Time  6    Period  Weeks    Status  On-going      PT LONG TERM GOAL #5   Title  Patient will report ability to negotiate steps with a reciprocating gait pattern with pain less than 3/10 to safely access basement.     Baseline  6    Period  Weeks    Status  Achieved            Plan - 02/26/19 1207    Clinical Impression Statement  Excellent response to treatments.  Patient now walking a mile.    PT Treatment/Interventions  ADLs/Self Care Home Management;Cryotherapy;Ultrasound;Moist Heat;Iontophoresis 19m/ml Dexamethasone;Electrical Stimulation;Stair training;Gait training;Therapeutic activities;Therapeutic exercise;Balance training;Neuromuscular re-education;Manual techniques;Dry needling;Passive range of motion;Vasopneumatic Device;Taping;Patient/family education       Patient will benefit from skilled therapeutic intervention in order to improve the following deficits and impairments:  Pain, Decreased activity tolerance, Decreased range of motion, Decreased strength, Difficulty walking  Visit Diagnosis: 1. Chronic pain of left knee   2. Pain in right hip        Problem List Patient Active Problem List   Diagnosis Date Noted  . H/O total shoulder replacement, right 11/01/2018  . Paroxysmal tachycardia ([place] 01/11/2017  . Nonrheumatic aortic valve stenosis 01/11/2017  . Type 1 diabetes mellitus without complication ([place] 058/52/7782 . Aortic atherosclerosis ([facility] 11/23/2016  . HNP (herniated nucleus pulposus), lumbar 10/06/2016  . Acute renal injury ([place] 08/06/2016  . Diarrhea 08/06/2016  . Fever 08/06/2016  . Hyperbilirubinemia 08/06/2016  . Septic shock ([facility] 08/06/2016  . Lactic acidosis  08/06/2016  . Type 2 diabetes mellitus with hyperlipidemia ([facility] 01/18/2016  . Inguinal hernia 12/10/2015  . Right groin pain 11/30/2015  . Thrombocytopenia ([place] 07/21/2014  . Bilateral carotid bruits 05/11/2014  . Vitamin D deficiency 09/17/2013  . BPH (benign prostatic hyperplasia) 05/22/2013  .  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 Diabetes type 2, controlled ([place]) 01/10/2013  . Essential hypertension, benign 01/10/2013  . OSA (obstructive sleep apnea) 05/16/2012  . Edema 02/21/2012  . At risk for coronary artery disease 03/20/2011  . Obesity 03/20/2011  . WEIGHT GAIN, ABNORMAL 11/09/2010  . Hyperlipidemia 06/22/2010  . ALLERGIC RHINITIS 06/22/2010  . ASTHMA 06/22/2010  . COUGH 06/22/2010    Arayah Krouse, [place] MPT 02/26/2019, 12:13 PM  [facility] [address] [place], [place], [address] Phone: [phone]   Fax:  [phone]  Name: Tony Cantu MRN: [id number] Date of Birth: 1950-09-03

## 2019-02-27 ENCOUNTER — Other Ambulatory Visit: Payer: BC Managed Care – PPO

## 2019-02-27 ENCOUNTER — Other Ambulatory Visit: Payer: Self-pay | Admitting: Internal Medicine

## 2019-02-27 ENCOUNTER — Other Ambulatory Visit: Payer: Self-pay

## 2019-02-27 DIAGNOSIS — K760 Fatty (change of) liver, not elsewhere classified: Secondary | ICD-10-CM | POA: Diagnosis not present

## 2019-02-27 DIAGNOSIS — K7460 Unspecified cirrhosis of liver: Secondary | ICD-10-CM | POA: Diagnosis not present

## 2019-02-27 DIAGNOSIS — R748 Abnormal levels of other serum enzymes: Secondary | ICD-10-CM | POA: Diagnosis not present

## 2019-02-27 DIAGNOSIS — Z86010 Personal history of colonic polyps: Secondary | ICD-10-CM

## 2019-02-27 LAB — CBC WITH DIFFERENTIAL/PLATELET
Basophils Absolute: 0.1 10*3/uL (ref 0.0–0.2)
Basos: 1 %
EOS (ABSOLUTE): 0.2 10*3/uL (ref 0.0–0.4)
Eos: 2 %
Hematocrit: 47 % (ref 37.5–51.0)
Hemoglobin: 15.5 g/dL (ref 13.0–17.7)
Immature Grans (Abs): 0 10*3/uL (ref 0.0–0.1)
Immature Granulocytes: 1 %
Lymphocytes Absolute: 1.6 10*3/uL (ref 0.7–3.1)
Lymphs: 24 %
MCH: 31.8 pg (ref 26.6–33.0)
MCHC: 33 g/dL (ref 31.5–35.7)
MCV: 97 fL (ref 79–97)
Monocytes Absolute: 1.1 10*3/uL — ABNORMAL HIGH (ref 0.1–0.9)
Monocytes: 16 %
Neutrophils Absolute: 3.7 10*3/uL (ref 1.4–7.0)
Neutrophils: 56 %
Platelets: 153 10*3/uL (ref 150–450)
RBC: 4.87 x10E6/uL (ref 4.14–5.80)
RDW: 13.9 % (ref 11.6–15.4)
WBC: 6.7 10*3/uL (ref 3.4–10.8)

## 2019-02-28 LAB — IRON AND TIBC
Iron Saturation: 25 % (ref 15–55)
Iron: 84 ug/dL (ref 38–169)
Total Iron Binding Capacity: 341 ug/dL (ref 250–450)
UIBC: 257 ug/dL (ref 111–343)

## 2019-02-28 LAB — COMPREHENSIVE METABOLIC PANEL
ALT: 43 IU/L (ref 0–44)
AST: 44 IU/L — ABNORMAL HIGH (ref 0–40)
Albumin/Globulin Ratio: 1.3 (ref 1.2–2.2)
Albumin: 4.1 g/dL (ref 3.8–4.8)
Alkaline Phosphatase: 66 IU/L (ref 39–117)
BUN/Creatinine Ratio: 14 (ref 10–24)
BUN: 14 mg/dL (ref 8–27)
Bilirubin Total: 0.7 mg/dL (ref 0.0–1.2)
CO2: 22 mmol/L (ref 20–29)
Calcium: 9.4 mg/dL (ref 8.6–10.2)
Chloride: 101 mmol/L (ref 96–106)
Creatinine, Ser: 0.97 mg/dL (ref 0.76–1.27)
GFR calc Af Amer: 92 mL/min/{1.73_m2} (ref >59)
GFR calc non Af Amer: 79 mL/min/{1.73_m2} (ref >59)
Globulin, Total: 3.1 g/dL (ref 1.5–4.5)
Glucose: 70 mg/dL (ref 65–99)
Potassium: 4.5 mmol/L (ref 3.5–5.2)
Sodium: 140 mmol/L (ref 134–144)
Total Protein: 7.2 g/dL (ref 6.0–8.5)

## 2019-02-28 LAB — HEPATITIS B SURFACE ANTIGEN: Hepatitis B Surface Ag: NEGATIVE

## 2019-02-28 LAB — IGG: IgG (Immunoglobin G), Serum: 1233 mg/dL (ref 603–1613)

## 2019-02-28 LAB — FERRITIN: Ferritin: 29 ng/mL — ABNORMAL LOW (ref 30–400)

## 2019-02-28 LAB — ALPHA-1-ANTITRYPSIN: A-1 Antitrypsin: 146 mg/dL (ref 101–187)

## 2019-02-28 LAB — ANA: Anti Nuclear Antibody (ANA): NEGATIVE

## 2019-02-28 LAB — PROTIME-INR
INR: 1.1 (ref 0.8–1.2)
Prothrombin Time: 11.2 s (ref 9.1–12.0)

## 2019-02-28 LAB — CERULOPLASMIN: Ceruloplasmin: 25.2 mg/dL (ref 16.0–31.0)

## 2019-03-01 LAB — ANTI-SMOOTH MUSCLE ANTIBODY, IGG: Smooth Muscle Ab: 10 Units (ref 0–19)

## 2019-03-01 LAB — MITOCHONDRIAL ANTIBODIES: Mitochondrial Ab: <20 Units (ref 0.0–20.0)

## 2019-03-01 LAB — HEPATITIS C ANTIBODY: Hep C Virus Ab: 0.2 s/co ratio (ref 0.0–0.9)

## 2019-03-01 LAB — HEPATITIS B SURFACE ANTIBODY,QUALITATIVE: Hep B Surface Ab, Qual: NONREACTIVE

## 2019-03-01 LAB — HEPATITIS B CORE ANTIBODY, TOTAL: Hep B Core Total Ab: NEGATIVE

## 2019-03-04 ENCOUNTER — Ambulatory Visit (HOSPITAL_COMMUNITY)
Admission: RE | Admit: 2019-03-04 | Discharge: 2019-03-04 | Disposition: A | Discharge status: Home / Self Care | Payer: BC Managed Care – PPO | Source: Ambulatory Visit | Attending: Internal Medicine | Admitting: Internal Medicine

## 2019-03-04 ENCOUNTER — Other Ambulatory Visit: Payer: Self-pay

## 2019-03-04 DIAGNOSIS — K760 Fatty (change of) liver, not elsewhere classified: Secondary | ICD-10-CM | POA: Diagnosis not present

## 2019-03-04 DIAGNOSIS — K7460 Unspecified cirrhosis of liver: Secondary | ICD-10-CM | POA: Diagnosis not present

## 2019-03-04 DIAGNOSIS — K7469 Other cirrhosis of liver: Secondary | ICD-10-CM | POA: Diagnosis not present

## 2019-03-04 DIAGNOSIS — E119 Type 2 diabetes mellitus without complications: Secondary | ICD-10-CM | POA: Diagnosis not present

## 2019-03-04 DIAGNOSIS — Z86010 Personal history of colonic polyps: Secondary | ICD-10-CM | POA: Diagnosis not present

## 2019-03-04 DIAGNOSIS — R748 Abnormal levels of other serum enzymes: Secondary | ICD-10-CM | POA: Diagnosis not present

## 2019-03-04 LAB — POCT I-STAT CREATININE: Creatinine, Ser: 1 mg/dL (ref 0.61–1.24)

## 2019-03-04 LAB — HM DIABETES EYE EXAM

## 2019-03-04 MED ORDER — GADOBUTROL 1 MMOL/ML IV SOLN
10.0000 mL | Freq: Once | INTRAVENOUS | Status: AC | PRN
  Administered 2019-03-04: 09:00:00 10 mL via INTRAVENOUS

## 2019-03-05 ENCOUNTER — Ambulatory Visit: Payer: BC Managed Care – PPO | Attending: Orthopedic Surgery | Admitting: Physical Therapy

## 2019-03-05 DIAGNOSIS — M25551 Pain in right hip: Secondary | ICD-10-CM | POA: Insufficient documentation

## 2019-03-05 DIAGNOSIS — M25562 Pain in left knee: Secondary | ICD-10-CM | POA: Insufficient documentation

## 2019-03-05 DIAGNOSIS — G8929 Other chronic pain: Secondary | ICD-10-CM

## 2019-03-05 NOTE — Therapy (Signed)
[facility] [place], [place], [address] Phone: [phone]   Fax:  [phone]  Physical Therapy Treatment  Patient Details  Name: Tony Cantu MRN: [id number] Date of Birth: 09-Mar-1951 Referring Provider (PT): Frederik Pear, MD   Encounter Date: 03/05/2019  PT End of Session - 03/05/19 0940    Visit Number  17    Number of Visits  22    Date for PT Re-Evaluation  03/26/19    PT Start Time  0815    PT Stop Time  0917    PT Time Calculation (min)  62 min    Activity Tolerance  Patient tolerated treatment well    Behavior During Therapy  [facility] for tasks assessed/performed       Past Medical History:  Diagnosis Date  . Arthritis   . Asthma   . BPH (benign prostatic hypertrophy)   . Colon polyps   . Diabetes mellitus without complication (HCC)    TYPE 1   . Gastritis   . GERD (gastroesophageal reflux disease)   . Headache    HX  . Hypertension   . PONV (postoperative nausea and vomiting)   . Seizures (HCC)    HYPOGLYCEMIC LAST 1 AND 1/2 YRS AGO  . Sleep apnea    uses CPAP nightly  . Testicle trouble    one testicle BORN WITH    Past Surgical History:  Procedure Laterality Date  . BACK SURGERY     94  LOWER   . CARPAL TUNNEL RELEASE Right 10/13/2015   Procedure: RIGHT CARPAL TUNNEL RELEASE;  Surgeon: Daryll Brod, MD;  Location: [place];  Service: Orthopedics;  Laterality: Right;  . CARPAL TUNNEL RELEASE Left 07/05/2016   Procedure: LEFT CARPAL TUNNEL RELEASE;  Surgeon: Daryll Brod, MD;  Location: [place];  Service: Orthopedics;  Laterality: Left;  . HERNIA REPAIR    . INGUINAL HERNIA REPAIR  2003   right   . KNEE ARTHROSCOPY Right 03/03/2016  . LUMBAR DISC SURGERY  8/96   Dr. Coralyn Mark, discectomy  . LUMBAR LAMINECTOMY/DECOMPRESSION MICRODISCECTOMY Right 10/06/2016   Procedure: RIGHT LUMBAR THREE - LUMBAR FOUR  LAMINECTOMY, FORAMINOTOMY AND MICRODISCECTOMY;  Surgeon: Jovita Gamma, MD;  Location: [place];  Service: Neurosurgery;  Laterality: Right;  . SHOULDER SURGERY  11/28/05   left partial  . SHOULDER SURGERY  07/14/2006   RIGHT  . TONSILLECTOMY  AGE 74 OR 4  . TOTAL SHOULDER ARTHROPLASTY Right 11/01/2018   Procedure: RIGHT SHOULDER REVISION TO REVERSE TOTAL SHOULDER;  Surgeon: Tania Ade, MD;  [facility];  Service: Orthopedics;  Laterality: Right;  CHOICE ANESTHESIA WITH INTERSCALENE BLOCK EXPAREL, NEEDS RNFA    There were no vitals filed for this visit.  Subjective Assessment - 03/05/19 0941    Subjective  COVID-19 screen performed prior to patient entering clinic.  Overall, doing much better.  Right hip better and left knee pain now only intermittment.    Limitations  Standing;Walking;House hold activities    How long can you walk comfortably?  less than 1 mile    Diagnostic tests  x-ray: normal    Patient Stated Goals  get better and return to walking program    Currently in Pain?  Yes    Pain Score  2     Pain Location  Knee    Pain Orientation  Left    Pain Descriptors / Indicators  Discomfort    Pain Type  Acute pain  Pain Onset  More than a month ago    Pain Score  3    Pain Location  Hip    Pain Orientation  Right    Pain Descriptors / Indicators  Aching                       [facility] Adult PT Treatment/Exercise - 03/05/19 0001      Modalities   Modalities  Electrical Stimulation;Moist Heat;Ultrasound      Electrical Stimulation   Electrical Stimulation Location  Right lateral hip.    Electrical Stimulation Action  IFC    Electrical Stimulation Parameters  80-150 Hz x 15 minutes.    Electrical Stimulation Goals  Pain      Ultrasound   Ultrasound Location  Right lateral hip    Ultrasound Parameters  Combo e'stim/u/S at 1.50 W/CM2 x 12 minutes.    Ultrasound Goals  Pain      Manual Therapy   Manual Therapy  Soft tissue mobilization    Soft tissue mobilization  STW/M x 15 minutes to patient's affected  lateral hip musculature.       Trigger Point Dry Needling - 03/05/19 0001    Consent Given?  Yes    Muscles Treated Back/Hip  Gluteus medius;Tensor fascia lata    Gluteus Medius Response  Twitch response elicited    Tensor Fascia Lata Response  Twitch response elicited                PT Long Term Goals - 02/01/19 0916      PT LONG TERM GOAL #1   Title  Patient will be independent with HEP and its progression    Time  6    Period  Weeks    Status  Achieved      PT LONG TERM GOAL #2   Title  Patient will demonstrate 5/5 left knee MMT to improve stability during functional tasks.    Time  6    Period  Weeks    Status  On-going      PT LONG TERM GOAL #3   Title  Patient will demonstrate 4+/5 or greater right hip MMT to improve stability during funtional tasks.    Time  6    Period  Weeks    Status  On-going      PT LONG TERM GOAL #4   Title  Patient will report ability to walk with right hip pain less than 3/10.    Time  6    Period  Weeks    Status  On-going      PT LONG TERM GOAL #5   Title  Patient will report ability to negotiate steps with a reciprocating gait pattern with pain less than 3/10 to safely access basement.     Baseline  6    Period  Weeks    Status  Achieved            Plan - 03/05/19 0946    Clinical Impression Statement  Patient doing very well with only infrequent left pain now and a significant reduction in right hip pain.    PT Treatment/Interventions  ADLs/Self Care Home Management;Cryotherapy;Ultrasound;Moist Heat;Iontophoresis 62m/ml Dexamethasone;Electrical Stimulation;Stair training;Gait training;Therapeutic activities;Therapeutic exercise;Balance training;Neuromuscular re-education;Manual techniques;Dry needling;Passive range of motion;Vasopneumatic Device;Taping;Patient/family education    PT Next Visit Plan  Nustep, hip and knee strengthening, modalities PRN for pain relief. Iontophoresis per referral    PT Home Exercise Plan   see patient education section  Consulted and Agree with Plan of Care  Patient       Patient will benefit from skilled therapeutic intervention in order to improve the following deficits and impairments:  Pain, Decreased activity tolerance, Decreased range of motion, Decreased strength, Difficulty walking  Visit Diagnosis: 1. Chronic pain of left knee   2. Pain in right hip        Problem List Patient Active Problem List   Diagnosis Date Noted  . H/O total shoulder replacement, right 11/01/2018  . Paroxysmal tachycardia ([place]) 01/11/2017  . Nonrheumatic aortic valve stenosis 01/11/2017  . Type 1 diabetes mellitus without complication ([place]) 16/05/9603  . Aortic atherosclerosis ([place]) 11/23/2016  . HNP (herniated nucleus pulposus), lumbar 10/06/2016  . Acute renal injury ([place]) 08/06/2016  . Diarrhea 08/06/2016  . Fever 08/06/2016  . Hyperbilirubinemia 08/06/2016  . Septic shock ([place]) 08/06/2016  . Lactic acidosis 08/06/2016  . Type 2 diabetes mellitus with hyperlipidemia ([place]) 01/18/2016  . Inguinal hernia 12/10/2015  . Right groin pain 11/30/2015  . Thrombocytopenia ([place]) 07/21/2014  . Bilateral carotid bruits 05/11/2014  . Vitamin D deficiency 09/17/2013  . BPH (benign prostatic hyperplasia) 05/22/2013  . 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 Diabetes type 2, controlled ([place]) 01/10/2013  . Essential hypertension, benign 01/10/2013  . OSA (obstructive sleep apnea) 05/16/2012  . Edema 02/21/2012  . At risk for coronary artery disease 03/20/2011  . Obesity 03/20/2011  . WEIGHT GAIN, ABNORMAL 11/09/2010  . Hyperlipidemia 06/22/2010  . ALLERGIC RHINITIS 06/22/2010  . ASTHMA 06/22/2010  . COUGH 06/22/2010    APPLEGATE, [place] MPT 03/05/2019, 10:24 AM  [facility] [place], [place], [address] Phone: [phone]   Fax:  [phone]  Name: YALE GOLLA MRN: [id number] Date of Birth: Aug 29, 1951

## 2019-03-08 ENCOUNTER — Other Ambulatory Visit: Payer: Self-pay

## 2019-03-08 ENCOUNTER — Ambulatory Visit: Payer: BC Managed Care – PPO | Admitting: *Deleted

## 2019-03-08 DIAGNOSIS — M25562 Pain in left knee: Secondary | ICD-10-CM | POA: Diagnosis not present

## 2019-03-08 DIAGNOSIS — G8929 Other chronic pain: Secondary | ICD-10-CM | POA: Diagnosis not present

## 2019-03-08 DIAGNOSIS — M25551 Pain in right hip: Secondary | ICD-10-CM | POA: Diagnosis not present

## 2019-03-08 NOTE — Therapy (Signed)
[facility] [place], [place], [address] Phone: [phone]   Fax:  [phone]  Physical Therapy Treatment  Patient Details  Name: Tony Cantu MRN: [id number] Date of Birth: 1951-04-19 Referring Provider (PT): Frederik Pear, MD   Encounter Date: 03/08/2019  PT End of Session - 03/08/19 0855    Visit Number  18    Number of Visits  22    Date for PT Re-Evaluation  03/26/19    PT Start Time  0815    PT Stop Time  0905    PT Time Calculation (min)  50 min       Past Medical History:  Diagnosis Date  . Arthritis   . Asthma   . BPH (benign prostatic hypertrophy)   . Colon polyps   . Diabetes mellitus without complication (HCC)    TYPE 1   . Gastritis   . GERD (gastroesophageal reflux disease)   . Headache    HX  . Hypertension   . PONV (postoperative nausea and vomiting)   . Seizures (HCC)    HYPOGLYCEMIC LAST 1 AND 1/2 YRS AGO  . Sleep apnea    uses CPAP nightly  . Testicle trouble    one testicle BORN WITH    Past Surgical History:  Procedure Laterality Date  . BACK SURGERY     94  LOWER   . CARPAL TUNNEL RELEASE Right 10/13/2015   Procedure: RIGHT CARPAL TUNNEL RELEASE;  Surgeon: Daryll Brod, MD;  Location: [place];  Service: Orthopedics;  Laterality: Right;  . CARPAL TUNNEL RELEASE Left 07/05/2016   Procedure: LEFT CARPAL TUNNEL RELEASE;  Surgeon: Daryll Brod, MD;  Location: [place];  Service: Orthopedics;  Laterality: Left;  . HERNIA REPAIR    . INGUINAL HERNIA REPAIR  2003   right   . KNEE ARTHROSCOPY Right 03/03/2016  . LUMBAR DISC SURGERY  8/96   Dr. Coralyn Mark, discectomy  . LUMBAR LAMINECTOMY/DECOMPRESSION MICRODISCECTOMY Right 10/06/2016   Procedure: RIGHT LUMBAR THREE - LUMBAR FOUR  LAMINECTOMY, FORAMINOTOMY AND MICRODISCECTOMY;  Surgeon: Jovita Gamma, MD;  Location: [place];  Service: Neurosurgery;  Laterality: Right;  . SHOULDER SURGERY  11/28/05   left partial   . SHOULDER SURGERY  07/14/2006   RIGHT  . TONSILLECTOMY  AGE 68 OR 4  . TOTAL SHOULDER ARTHROPLASTY Right 11/01/2018   Procedure: RIGHT SHOULDER REVISION TO REVERSE TOTAL SHOULDER;  Surgeon: Tania Ade, MD;  [facility];  Service: Orthopedics;  Laterality: Right;  CHOICE ANESTHESIA WITH INTERSCALENE BLOCK EXPAREL, NEEDS RNFA    There were no vitals filed for this visit.  Subjective Assessment - 03/08/19 0814    Subjective  COVID-19 screen performed prior to patient entering clinic.  Overall, doing much better in the LT knee. The RT hip aches still 2//10. After mowing the grass yesterday 6/10 pain. Needling has helped    Pertinent History  DM    Limitations  Standing;Walking;House hold activities    Currently in Pain?  Yes    Pain Score  2                        [facility] Adult PT Treatment/Exercise - 03/08/19 0001      Modalities   Modalities  Electrical Stimulation;Moist Heat;Ultrasound      Moist Heat Therapy   Number Minutes Moist Heat  15 Minutes    Moist Heat Location  --   Right lateral hip.  Acupuncturist Location  Right lateral hip.    Electrical Stimulation Action  IFC    Electrical Stimulation Parameters  80-150hz  x 15 mins    Electrical Stimulation Goals  Pain      Ultrasound   Ultrasound Location  RT lateral hip    Ultrasound Parameters  Combo x 10 mins 1.5 w/cm2    Ultrasound Goals  Pain      Manual Therapy   Manual Therapy  Soft tissue mobilization    Soft tissue mobilization  STW/M x 15 minutes to patient's affected lateral hip musculature. and ITB. ITB mobs and lift                  PT Long Term Goals - 02/01/19 0916      PT LONG TERM GOAL #1   Title  Patient will be independent with HEP and its progression    Time  6    Period  Weeks    Status  Achieved      PT LONG TERM GOAL #2   Title  Patient will demonstrate 5/5 left knee MMT to improve stability during functional tasks.     Time  6    Period  Weeks    Status  On-going      PT LONG TERM GOAL #3   Title  Patient will demonstrate 4+/5 or greater right hip MMT to improve stability during funtional tasks.    Time  6    Period  Weeks    Status  On-going      PT LONG TERM GOAL #4   Title  Patient will report ability to walk with right hip pain less than 3/10.    Time  6    Period  Weeks    Status  On-going      PT LONG TERM GOAL #5   Title  Patient will report ability to negotiate steps with a reciprocating gait pattern with pain less than 3/10 to safely access basement.     Baseline  6    Period  Weeks    Status  Achieved            Plan - 03/08/19 0857    Clinical Impression Statement  Pt arrived doing fairly well with 2/10 RT hip pain. He does report 5-6/10 pain yesterday after mowing the grass. He did well with Rx today,but with notable tightness along ITB.    Personal Factors and Comorbidities  Age;Comorbidity 1    Comorbidities  DM    Examination-Activity Limitations  Stairs;Stand    Stability/Clinical Decision Making  Stable/Uncomplicated    Rehab Potential  Good    PT Frequency  2x / week    PT Duration  6 weeks    PT Treatment/Interventions  ADLs/Self Care Home Management;Cryotherapy;Ultrasound;Moist Heat;Iontophoresis 12m/ml Dexamethasone;Electrical Stimulation;Stair training;Gait training;Therapeutic activities;Therapeutic exercise;Balance training;Neuromuscular re-education;Manual techniques;Dry needling;Passive range of motion;Vasopneumatic Device;Taping;Patient/family education    PT Next Visit Plan  Nustep, hip and knee strengthening, modalities PRN for pain relief. Iontophoresis per referral    PT Home Exercise Plan  see patient education section    Consulted and Agree with Plan of Care  Patient       Patient will benefit from skilled therapeutic intervention in order to improve the following deficits and impairments:  Pain, Decreased activity tolerance, Decreased range of motion,  Decreased strength, Difficulty walking  Visit Diagnosis: 1. Pain in right hip   2. Chronic pain of left knee  Problem List Patient Active Problem List   Diagnosis Date Noted  . H/O total shoulder replacement, right 11/01/2018  . Paroxysmal tachycardia ([place]) 01/11/2017  . Nonrheumatic aortic valve stenosis 01/11/2017  . Type 1 diabetes mellitus without complication ([place]) 84/69/6295  . Aortic atherosclerosis ([place]) 11/23/2016  . HNP (herniated nucleus pulposus), lumbar 10/06/2016  . Acute renal injury ([place]) 08/06/2016  . Diarrhea 08/06/2016  . Fever 08/06/2016  . Hyperbilirubinemia 08/06/2016  . Septic shock ([place]) 08/06/2016  . Lactic acidosis 08/06/2016  . Type 2 diabetes mellitus with hyperlipidemia ([place]) 01/18/2016  . Inguinal hernia 12/10/2015  . Right groin pain 11/30/2015  . Thrombocytopenia ([place]) 07/21/2014  . Bilateral carotid bruits 05/11/2014  . Vitamin D deficiency 09/17/2013  . BPH (benign prostatic hyperplasia) 05/22/2013  . 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 Diabetes type 2, controlled ([place]) 01/10/2013  . Essential hypertension, benign 01/10/2013  . OSA (obstructive sleep apnea) 05/16/2012  . Edema 02/21/2012  . At risk for coronary artery disease 03/20/2011  . Obesity 03/20/2011  . WEIGHT GAIN, ABNORMAL 11/09/2010  . Hyperlipidemia 06/22/2010  . ALLERGIC RHINITIS 06/22/2010  . ASTHMA 06/22/2010  . COUGH 06/22/2010    RAMSEUR,CHRIS, PTA 03/08/2019, 10:59 AM  [facility] [place], [place], [address] Phone: [phone]   Fax:  [phone]  Name: Tony Cantu MRN: [id number] Date of Birth: 04-17-1951

## 2019-03-12 ENCOUNTER — Encounter: Payer: Self-pay | Admitting: Physical Therapy

## 2019-03-12 ENCOUNTER — Ambulatory Visit: Payer: BC Managed Care – PPO | Admitting: Physical Therapy

## 2019-03-12 ENCOUNTER — Other Ambulatory Visit: Payer: Self-pay

## 2019-03-12 DIAGNOSIS — M25551 Pain in right hip: Secondary | ICD-10-CM

## 2019-03-12 DIAGNOSIS — M25562 Pain in left knee: Secondary | ICD-10-CM | POA: Diagnosis not present

## 2019-03-12 DIAGNOSIS — G8929 Other chronic pain: Secondary | ICD-10-CM

## 2019-03-12 NOTE — Therapy (Signed)
[facility] [place], [place], [address] Phone: [phone]   Fax:  [phone]  Physical Therapy Treatment  Patient Details  Name: Tony Cantu MRN: [id number] Date of Birth: Jan 14, 1951 Referring Provider (PT): Frederik Pear, MD   Encounter Date: 03/12/2019  PT End of Session - 03/12/19 1023    Visit Number  19    Number of Visits  22    Date for PT Re-Evaluation  03/26/19    PT Start Time  0900    PT Stop Time  1005    PT Time Calculation (min)  65 min       Past Medical History:  Diagnosis Date  . Arthritis   . Asthma   . BPH (benign prostatic hypertrophy)   . Colon polyps   . Diabetes mellitus without complication (HCC)    TYPE 1   . Gastritis   . GERD (gastroesophageal reflux disease)   . Headache    HX  . Hypertension   . PONV (postoperative nausea and vomiting)   . Seizures (HCC)    HYPOGLYCEMIC LAST 1 AND 1/2 YRS AGO  . Sleep apnea    uses CPAP nightly  . Testicle trouble    one testicle BORN WITH    Past Surgical History:  Procedure Laterality Date  . BACK SURGERY     89  LOWER   . CARPAL TUNNEL RELEASE Right 10/13/2015   Procedure: RIGHT CARPAL TUNNEL RELEASE;  Surgeon: Daryll Brod, MD;  Location: [place];  Service: Orthopedics;  Laterality: Right;  . CARPAL TUNNEL RELEASE Left 07/05/2016   Procedure: LEFT CARPAL TUNNEL RELEASE;  Surgeon: Daryll Brod, MD;  Location: [place];  Service: Orthopedics;  Laterality: Left;  . HERNIA REPAIR    . INGUINAL HERNIA REPAIR  2003   right   . KNEE ARTHROSCOPY Right 03/03/2016  . LUMBAR DISC SURGERY  8/96   Dr. Coralyn Mark, discectomy  . LUMBAR LAMINECTOMY/DECOMPRESSION MICRODISCECTOMY Right 10/06/2016   Procedure: RIGHT LUMBAR THREE - LUMBAR FOUR  LAMINECTOMY, FORAMINOTOMY AND MICRODISCECTOMY;  Surgeon: Jovita Gamma, MD;  Location: [place];  Service: Neurosurgery;  Laterality: Right;  . SHOULDER SURGERY  11/28/05   left partial   . SHOULDER SURGERY  07/14/2006   RIGHT  . TONSILLECTOMY  AGE 42 OR 4  . TOTAL SHOULDER ARTHROPLASTY Right 11/01/2018   Procedure: RIGHT SHOULDER REVISION TO REVERSE TOTAL SHOULDER;  Surgeon: Tania Ade, MD;  [facility];  Service: Orthopedics;  Laterality: Right;  CHOICE ANESTHESIA WITH INTERSCALENE BLOCK EXPAREL, NEEDS RNFA    There were no vitals filed for this visit.  Subjective Assessment - 03/12/19 1017    Subjective  COVID-19 screen performed prior to patient entering clinic.  My hip feels stiff.  Look forwadr to dry needling.    Limitations  Standing;Walking;House hold activities    How long can you walk comfortably?  less than 1 mile    Patient Stated Goals  get better and return to walking program    Currently in Pain?  Yes    Pain Score  2     Pain Location  Knee    Pain Score  3    Pain Location  Hip    Pain Orientation  Right    Pain Descriptors / Indicators  --   "Stiff"                      [facility] Adult PT Treatment/Exercise - 03/12/19  0001      Modalities   Modalities  Electrical Stimulation;Moist Heat;Ultrasound      Moist Heat Therapy   Number Minutes Moist Heat  20 Minutes    Moist Heat Location  --   Right lateral hip.     Acupuncturist Location  Right lateral hip.    Electrical Stimulation Action  IFC    Electrical Stimulation Parameters  80-150 Hz x 20 minutes.    Electrical Stimulation Goals  Pain      Ultrasound   Ultrasound Location  Right lateral hip.    Ultrasound Parameters  Combo e'stim/U/S at 1.50 W/CM2 x 12 minutes.    Ultrasound Goals  Pain      Manual Therapy   Manual Therapy  Soft tissue mobilization    Soft tissue mobilization  STW/M x 12 minutes to patient's right lateral hip f/b a 2 minute sustained Piriformis stretch.       Trigger Point Dry Needling - 03/12/19 0001    Consent Given?  Yes    Muscles Treated Back/Hip  Gluteus medius;Tensor fascia lata    Gluteus Medius  Response  Twitch response elicited    Tensor Fascia Lata Response  Twitch response elicited                PT Long Term Goals - 02/01/19 0916      PT LONG TERM GOAL #1   Title  Patient will be independent with HEP and its progression    Time  6    Period  Weeks    Status  Achieved      PT LONG TERM GOAL #2   Title  Patient will demonstrate 5/5 left knee MMT to improve stability during functional tasks.    Time  6    Period  Weeks    Status  On-going      PT LONG TERM GOAL #3   Title  Patient will demonstrate 4+/5 or greater right hip MMT to improve stability during funtional tasks.    Time  6    Period  Weeks    Status  On-going      PT LONG TERM GOAL #4   Title  Patient will report ability to walk with right hip pain less than 3/10.    Time  6    Period  Weeks    Status  On-going      PT LONG TERM GOAL #5   Title  Patient will report ability to negotiate steps with a reciprocating gait pattern with pain less than 3/10 to safely access basement.     Baseline  6    Period  Weeks    Status  Achieved            Plan - 03/12/19 1024    Clinical Impression Statement  Patient did great with treatment.  He finds dry needling very helpful.  No c/o stiffness following treatment today.    PT Treatment/Interventions  ADLs/Self Care Home Management;Cryotherapy;Ultrasound;Moist Heat;Iontophoresis 36m/ml Dexamethasone;Electrical Stimulation;Stair training;Gait training;Therapeutic activities;Therapeutic exercise;Balance training;Neuromuscular re-education;Manual techniques;Dry needling;Passive range of motion;Vasopneumatic Device;Taping;Patient/family education       Patient will benefit from skilled therapeutic intervention in order to improve the following deficits and impairments:  Pain, Decreased activity tolerance, Decreased range of motion, Decreased strength, Difficulty walking  Visit Diagnosis: 1. Pain in right hip   2. Chronic pain of left knee         Problem List Patient Active Problem List  Diagnosis Date Noted  . H/O total shoulder replacement, right 11/01/2018  . Paroxysmal tachycardia ([place]) 01/11/2017  . Nonrheumatic aortic valve stenosis 01/11/2017  . Type 1 diabetes mellitus without complication ([place]) 27/02/8674  . Aortic atherosclerosis ([place]) 11/23/2016  . HNP (herniated nucleus pulposus), lumbar 10/06/2016  . Acute renal injury ([place]) 08/06/2016  . Diarrhea 08/06/2016  . Fever 08/06/2016  . Hyperbilirubinemia 08/06/2016  . Septic shock ([place]) 08/06/2016  . Lactic acidosis 08/06/2016  . Type 2 diabetes mellitus with hyperlipidemia ([place]) 01/18/2016  . Inguinal hernia 12/10/2015  . Right groin pain 11/30/2015  . Thrombocytopenia ([place]) 07/21/2014  . Bilateral carotid bruits 05/11/2014  . Vitamin D deficiency 09/17/2013  . BPH (benign prostatic hyperplasia) 05/22/2013  . 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 Diabetes type 2, controlled ([place]) 01/10/2013  . Essential hypertension, benign 01/10/2013  . OSA (obstructive sleep apnea) 05/16/2012  . Edema 02/21/2012  . At risk for coronary artery disease 03/20/2011  . Obesity 03/20/2011  . WEIGHT GAIN, ABNORMAL 11/09/2010  . Hyperlipidemia 06/22/2010  . ALLERGIC RHINITIS 06/22/2010  . ASTHMA 06/22/2010  . COUGH 06/22/2010    Cosimo Schertzer, [place] MPT 03/12/2019, 10:27 AM  [facility] [address] [place], [place], [address] Phone: [phone]   Fax:  [phone]  Name: ZACCHAEUS HALM MRN: [id number] Date of Birth: 04-16-1951

## 2019-03-13 DIAGNOSIS — Z96611 Presence of right artificial shoulder joint: Secondary | ICD-10-CM | POA: Diagnosis not present

## 2019-03-13 DIAGNOSIS — M25511 Pain in right shoulder: Secondary | ICD-10-CM | POA: Diagnosis not present

## 2019-03-15 ENCOUNTER — Ambulatory Visit: Payer: BC Managed Care – PPO | Admitting: *Deleted

## 2019-03-19 ENCOUNTER — Ambulatory Visit: Payer: BC Managed Care – PPO | Admitting: Physical Therapy

## 2019-03-19 ENCOUNTER — Encounter: Payer: Self-pay | Admitting: Physical Therapy

## 2019-03-19 ENCOUNTER — Other Ambulatory Visit: Payer: Self-pay

## 2019-03-19 DIAGNOSIS — M25562 Pain in left knee: Secondary | ICD-10-CM | POA: Diagnosis not present

## 2019-03-19 DIAGNOSIS — G8929 Other chronic pain: Secondary | ICD-10-CM | POA: Diagnosis not present

## 2019-03-19 DIAGNOSIS — M25551 Pain in right hip: Secondary | ICD-10-CM | POA: Diagnosis not present

## 2019-03-19 NOTE — Therapy (Signed)
[facility] [place], [place], [address] Phone: [phone]   Fax:  [phone]  Physical Therapy Treatment  Patient Details  Name: Tony Cantu MRN: [id number] Date of Birth: 15-Dec-1950 Referring Provider (PT): Frederik Pear, MD   Encounter Date: 03/19/2019  PT End of Session - 03/19/19 1051    Visit Number  20    Number of Visits  22    Date for PT Re-Evaluation  03/26/19    PT Start Time  0900    PT Stop Time  1000    PT Time Calculation (min)  60 min    Activity Tolerance  Patient tolerated treatment well    Behavior During Therapy  [facility] for tasks assessed/performed       Past Medical History:  Diagnosis Date  . Arthritis   . Asthma   . BPH (benign prostatic hypertrophy)   . Colon polyps   . Diabetes mellitus without complication (HCC)    TYPE 1   . Gastritis   . GERD (gastroesophageal reflux disease)   . Headache    HX  . Hypertension   . PONV (postoperative nausea and vomiting)   . Seizures (HCC)    HYPOGLYCEMIC LAST 1 AND 1/2 YRS AGO  . Sleep apnea    uses CPAP nightly  . Testicle trouble    one testicle BORN WITH    Past Surgical History:  Procedure Laterality Date  . BACK SURGERY     89  LOWER   . CARPAL TUNNEL RELEASE Right 10/13/2015   Procedure: RIGHT CARPAL TUNNEL RELEASE;  Surgeon: Daryll Brod, MD;  Location: [place];  Service: Orthopedics;  Laterality: Right;  . CARPAL TUNNEL RELEASE Left 07/05/2016   Procedure: LEFT CARPAL TUNNEL RELEASE;  Surgeon: Daryll Brod, MD;  Location: [place];  Service: Orthopedics;  Laterality: Left;  . HERNIA REPAIR    . INGUINAL HERNIA REPAIR  2003   right   . KNEE ARTHROSCOPY Right 03/03/2016  . LUMBAR DISC SURGERY  8/96   Dr. Coralyn Mark, discectomy  . LUMBAR LAMINECTOMY/DECOMPRESSION MICRODISCECTOMY Right 10/06/2016   Procedure: RIGHT LUMBAR THREE - LUMBAR FOUR  LAMINECTOMY, FORAMINOTOMY AND MICRODISCECTOMY;  Surgeon: Jovita Gamma, MD;  Location: [place];  Service: Neurosurgery;  Laterality: Right;  . SHOULDER SURGERY  11/28/05   left partial  . SHOULDER SURGERY  07/14/2006   RIGHT  . TONSILLECTOMY  AGE 62 OR 4  . TOTAL SHOULDER ARTHROPLASTY Right 11/01/2018   Procedure: RIGHT SHOULDER REVISION TO REVERSE TOTAL SHOULDER;  Surgeon: Tania Ade, MD;  [facility];  Service: Orthopedics;  Laterality: Right;  CHOICE ANESTHESIA WITH INTERSCALENE BLOCK EXPAREL, NEEDS RNFA    There were no vitals filed for this visit.  Subjective Assessment - 03/19/19 1054    Subjective  COVID-19 screen performed prior to patient entering clinic.  My hip is doing much better and hasn't been locking up.    Pertinent History  DM    Limitations  Standing;Walking;House hold activities    How long Tony you walk comfortably?  less than 1 mile    Diagnostic tests  x-ray: normal    Patient Stated Goals  get better and return to walking program    Currently in Pain?  Yes    Pain Score  1     Pain Location  Knee    Pain Orientation  Left    Pain Descriptors / Indicators  Discomfort    Pain Type  Acute  pain    Pain Onset  More than a month ago    Pain Score  2    Pain Location  Hip    Pain Orientation  Right    Pain Descriptors / Indicators  --   Mild stiffness.                      [facility] Adult PT Treatment/Exercise - 03/19/19 0001      Modalities   Modalities  Electrical Stimulation;Ultrasound      Electrical Stimulation   Electrical Stimulation Location  RT lateral hip.    Electrical Stimulation Action  IFC    Electrical Stimulation Parameters  80-150 Hz x 20 minutes.    Electrical Stimulation Goals  Pain      Ultrasound   Ultrasound Location  Right lateral hip.    Ultrasound Parameters  Combo e'stim/U/S at 1.50 W/CM2 x 12 minutes.    Ultrasound Goals  Pain      Manual Therapy   Manual Therapy  Soft tissue mobilization    Soft tissue mobilization  STW/M x 12 minutes.       Trigger Point Dry  Needling - 03/19/19 0001    Consent Given?  Yes    Muscles Treated Back/Hip  Gluteus medius;Tensor fascia lata    Gluteus Medius Response  Twitch response elicited    Tensor Fascia Lata Response  Twitch response elicited                PT Long Term Goals - 02/01/19 0916      PT LONG TERM GOAL #1   Title  Patient will be independent with HEP and its progression    Time  6    Period  Weeks    Status  Achieved      PT LONG TERM GOAL #2   Title  Patient will demonstrate 5/5 left knee MMT to improve stability during functional tasks.    Time  6    Period  Weeks    Status  On-going      PT LONG TERM GOAL #3   Title  Patient will demonstrate 4+/5 or greater right hip MMT to improve stability during funtional tasks.    Time  6    Period  Weeks    Status  On-going      PT LONG TERM GOAL #4   Title  Patient will report ability to walk with right hip pain less than 3/10.    Time  6    Period  Weeks    Status  On-going      PT LONG TERM GOAL #5   Title  Patient will report ability to negotiate steps with a reciprocating gait pattern with pain less than 3/10 to safely access basement.     Baseline  6    Period  Weeks    Status  Achieved            Plan - 03/19/19 1124    Clinical Impression Statement  See "Therapy Note" section.    Stability/Clinical Decision Making  Stable/Uncomplicated    PT Treatment/Interventions  ADLs/Self Care Home Management;Cryotherapy;Ultrasound;Moist Heat;Iontophoresis 64m/ml Dexamethasone;Electrical Stimulation;Stair training;Gait training;Therapeutic activities;Therapeutic exercise;Balance training;Neuromuscular re-education;Manual techniques;Dry needling;Passive range of motion;Vasopneumatic Device;Taping;Patient/family education    PT Next Visit Plan  Nustep, hip and knee strengthening, modalities PRN for pain relief. Iontophoresis per referral    PT Home Exercise Plan  see patient education section    Consulted and Agree with Plan  of  Care  Patient       Patient will benefit from skilled therapeutic intervention in order to improve the following deficits and impairments:  Pain, Decreased activity tolerance, Decreased range of motion, Decreased strength, Difficulty walking  Visit Diagnosis: 1. Pain in right hip   2. Chronic pain of left knee        Problem List Patient Active Problem List   Diagnosis Date Noted  . H/O total shoulder replacement, right 11/01/2018  . Paroxysmal tachycardia ([place]) 01/11/2017  . Nonrheumatic aortic valve stenosis 01/11/2017  . Type 1 diabetes mellitus without complication ([place]) 57/49/3552  . Aortic atherosclerosis ([place]) 11/23/2016  . HNP (herniated nucleus pulposus), lumbar 10/06/2016  . Acute renal injury ([place]) 08/06/2016  . Diarrhea 08/06/2016  . Fever 08/06/2016  . Hyperbilirubinemia 08/06/2016  . Septic shock ([place]) 08/06/2016  . Lactic acidosis 08/06/2016  . Type 2 diabetes mellitus with hyperlipidemia ([place]) 01/18/2016  . Inguinal hernia 12/10/2015  . Right groin pain 11/30/2015  . Thrombocytopenia ([place]) 07/21/2014  . Bilateral carotid bruits 05/11/2014  . Vitamin D deficiency 09/17/2013  . BPH (benign prostatic hyperplasia) 05/22/2013  . 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 Diabetes type 2, controlled ([place]) 01/10/2013  . Essential hypertension, benign 01/10/2013  . OSA (obstructive sleep apnea) 05/16/2012  . Edema 02/21/2012  . At risk for coronary artery disease 03/20/2011  . Obesity 03/20/2011  . WEIGHT GAIN, ABNORMAL 11/09/2010  . Hyperlipidemia 06/22/2010  . ALLERGIC RHINITIS 06/22/2010  . ASTHMA 06/22/2010  . COUGH 06/22/2010    Progress Note Reporting Period 01/01/19 to 03/19/19  See note below for Objective Data and Assessment of Progress/Goals. The patient has done very well since starting dry needling.  He states his right hip has not been "locking up".  He is able to walk longer without pain.     Tony Cantu, [place] MPT 03/19/2019, 12:06 PM  [facility] [address] [place], [place], [address] Phone: [phone]   Fax:  [phone]  Name: Tony Cantu MRN: [id number] Date of Birth: [birth year]

## 2019-03-22 ENCOUNTER — Ambulatory Visit: Payer: BC Managed Care – PPO | Admitting: Physical Therapy

## 2019-03-26 ENCOUNTER — Other Ambulatory Visit: Payer: Self-pay

## 2019-03-26 ENCOUNTER — Ambulatory Visit: Payer: BC Managed Care – PPO | Admitting: Physical Therapy

## 2019-03-26 DIAGNOSIS — M25562 Pain in left knee: Secondary | ICD-10-CM | POA: Diagnosis not present

## 2019-03-26 DIAGNOSIS — M25551 Pain in right hip: Secondary | ICD-10-CM

## 2019-03-26 DIAGNOSIS — G8929 Other chronic pain: Secondary | ICD-10-CM

## 2019-03-26 NOTE — Therapy (Addendum)
[facility] [place], [place], [address] Phone: [phone]   Fax:  [phone]  Physical Therapy Treatment  Patient Details  Name: Tony Cantu MRN: [id number] Date of Birth: November 10, 1950 Referring Provider (PT): Frederik Pear, MD   Encounter Date: 03/26/2019  PT End of Session - 03/26/19 1144    Visit Number  21    Number of Visits  22    Date for PT Re-Evaluation  03/26/19    PT Start Time  0900    PT Stop Time  0958    PT Time Calculation (min)  58 min    Activity Tolerance  Patient tolerated treatment well    Behavior During Therapy  [facility] for tasks assessed/performed       Past Medical History:  Diagnosis Date  . Arthritis   . Asthma   . BPH (benign prostatic hypertrophy)   . Colon polyps   . Diabetes mellitus without complication (HCC)    TYPE 1   . Gastritis   . GERD (gastroesophageal reflux disease)   . Headache    HX  . Hypertension   . PONV (postoperative nausea and vomiting)   . Seizures (HCC)    HYPOGLYCEMIC LAST 1 AND 1/2 YRS AGO  . Sleep apnea    uses CPAP nightly  . Testicle trouble    one testicle BORN WITH    Past Surgical History:  Procedure Laterality Date  . BACK SURGERY     68  LOWER   . CARPAL TUNNEL RELEASE Right 10/13/2015   Procedure: RIGHT CARPAL TUNNEL RELEASE;  Surgeon: Daryll Brod, MD;  Location: [place];  Service: Orthopedics;  Laterality: Right;  . CARPAL TUNNEL RELEASE Left 07/05/2016   Procedure: LEFT CARPAL TUNNEL RELEASE;  Surgeon: Daryll Brod, MD;  Location: [place];  Service: Orthopedics;  Laterality: Left;  . HERNIA REPAIR    . INGUINAL HERNIA REPAIR  2003   right   . KNEE ARTHROSCOPY Right 03/03/2016  . LUMBAR DISC SURGERY  8/96   Dr. Coralyn Mark, discectomy  . LUMBAR LAMINECTOMY/DECOMPRESSION MICRODISCECTOMY Right 10/06/2016   Procedure: RIGHT LUMBAR THREE - LUMBAR FOUR  LAMINECTOMY, FORAMINOTOMY AND MICRODISCECTOMY;  Surgeon: Jovita Gamma, MD;  Location: [place];  Service: Neurosurgery;  Laterality: Right;  . SHOULDER SURGERY  11/28/05   left partial  . SHOULDER SURGERY  07/14/2006   RIGHT  . TONSILLECTOMY  AGE 68 OR 4  . TOTAL SHOULDER ARTHROPLASTY Right 11/01/2018   Procedure: RIGHT SHOULDER REVISION TO REVERSE TOTAL SHOULDER;  Surgeon: Tania Ade, MD;  [facility];  Service: Orthopedics;  Laterality: Right;  CHOICE ANESTHESIA WITH INTERSCALENE BLOCK EXPAREL, NEEDS RNFA    There were no vitals filed for this visit.  Subjective Assessment - 03/26/19 1138    Subjective  COVID-19 screen performed prior to patient entering clinic.  Much better.  Going to save a visit.    Limitations  Standing;Walking;House hold activities    How long can you walk comfortably?  less than 1 mile    Currently in Pain?  Yes    Pain Score  2    Pain Location  Hip    Pain Orientation  Right    Pain Descriptors / Indicators  --   Some mild stiffness.                      [facility] Adult PT Treatment/Exercise - 03/26/19 0001      Modalities  Modalities  Electrical Stimulation;Moist Heat;Ultrasound      Acupuncturist Location  RT lateral hip.    Electrical Stimulation Action  IFC    Electrical Stimulation Parameters  80-150 Hz x 20 minutes.    Electrical Stimulation Goals  Pain      Ultrasound   Ultrasound Location  RT lateral hip.    Ultrasound Parameters  U/S at 1.50 W/CM2 x 12 minutes.    Ultrasound Goals  Pain      Manual Therapy   Manual Therapy  Soft tissue mobilization    Soft tissue mobilization  STW/M x 12 minutes.       Trigger Point Dry Needling - 03/26/19 0001    Consent Given?  Yes    Muscles Treated Back/Hip  Gluteus medius;Tensor fascia lata    Gluteus Medius Response  Twitch response elicited    Tensor Fascia Lata Response  Twitch response elicited                PT Long Term Goals - 02/01/19 0916      PT LONG TERM GOAL #1   Title   Patient will be independent with HEP and its progression    Time  6    Period  Weeks    Status  Achieved      PT LONG TERM GOAL #2   Title  Patient will demonstrate 5/5 left knee MMT to improve stability during functional tasks.    Time  6    Period  Weeks    Status  On-going      PT LONG TERM GOAL #3   Title  Patient will demonstrate 4+/5 or greater right hip MMT to improve stability during funtional tasks.    Time  6    Period  Weeks    Status  On-going      PT LONG TERM GOAL #4   Title  Patient will report ability to walk with right hip pain less than 3/10.    Time  6    Period  Weeks    Status  On-going      PT LONG TERM GOAL #5   Title  Patient will report ability to negotiate steps with a reciprocating gait pattern with pain less than 3/10 to safely access basement.     Baseline  6    Period  Weeks    Status  Achieved            Plan - 03/26/19 1142    Clinical Impression Statement  The patient has done very well with PT.  He reports very minimal left knee and right hip pain now.  He is going to "save" one visit should he have a flare-up over the next couple of weeks.    Stability/Clinical Decision Making  Stable/Uncomplicated    PT Treatment/Interventions  ADLs/Self Care Home Management;Cryotherapy;Ultrasound;Moist Heat;Iontophoresis 57m/ml Dexamethasone;Electrical Stimulation;Stair training;Gait training;Therapeutic activities;Therapeutic exercise;Balance training;Neuromuscular re-education;Manual techniques;Dry needling;Passive range of motion;Vasopneumatic Device;Taping;Patient/family education    PT Next Visit Plan  Nustep, hip and knee strengthening, modalities PRN for pain relief. Iontophoresis per referral    PT Home Exercise Plan  see patient education section       Patient will benefit from skilled therapeutic intervention in order to improve the following deficits and impairments:  Pain, Decreased activity tolerance, Decreased range of motion, Decreased  strength, Difficulty walking  Visit Diagnosis: 1. Pain in right hip   2. Chronic pain of left knee  Problem List Patient Active Problem List   Diagnosis Date Noted  . H/O total shoulder replacement, right 11/01/2018  . Paroxysmal tachycardia ([place]) 01/11/2017  . Nonrheumatic aortic valve stenosis 01/11/2017  . Type 1 diabetes mellitus without complication ([place]) 71/16/5790  . Aortic atherosclerosis ([place]) 11/23/2016  . HNP (herniated nucleus pulposus), lumbar 10/06/2016  . Acute renal injury ([place]) 08/06/2016  . Diarrhea 08/06/2016  . Fever 08/06/2016  . Hyperbilirubinemia 08/06/2016  . Septic shock ([place]) 08/06/2016  . Lactic acidosis 08/06/2016  . Type 2 diabetes mellitus with hyperlipidemia ([place]) 01/18/2016  . Inguinal hernia 12/10/2015  . Right groin pain 11/30/2015  . Thrombocytopenia ([place]) 07/21/2014  . Bilateral carotid bruits 05/11/2014  . Vitamin D deficiency 09/17/2013  . BPH (benign prostatic hyperplasia) 05/22/2013  . 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 Diabetes type 2, controlled ([place]) 01/10/2013  . Essential hypertension, benign 01/10/2013  . OSA (obstructive sleep apnea) 05/16/2012  . Edema 02/21/2012  . At risk for coronary artery disease 03/20/2011  . Obesity 03/20/2011  . WEIGHT GAIN, ABNORMAL 11/09/2010  . Hyperlipidemia 06/22/2010  . ALLERGIC RHINITIS 06/22/2010  . ASTHMA 06/22/2010  . COUGH 06/22/2010   PHYSICAL THERAPY DISCHARGE SUMMARY  Visits from Start of Care: 21.  Current functional level related to goals / functional outcomes: See above.   Remaining deficits: Patient doing well with minimal pain complaints.   Education / Equipment: HEP. Plan: Patient agrees to discharge.  Patient goals were partially met. Patient is being discharged due to being pleased with the current functional level.  ?????      Norrine Ballester, [place] MPT 03/26/2019, 11:45 AM  [facility] [address] [place], [place], [address] Phone: [phone]   Fax:  [phone]  Name: LADD CEN MRN: [id number] Date of Birth: 20-Feb-1951

## 2019-03-27 ENCOUNTER — Telehealth: Payer: Self-pay | Admitting: Internal Medicine

## 2019-03-27 NOTE — Telephone Encounter (Signed)
Patient called would like to know what would be considered high/low Blood sugar. His question comes from the paper work that he received for his colonoscopy that is scheduled on 8/1

## 2019-03-27 NOTE — Telephone Encounter (Signed)
Patient advised that low/high glucose is determined on a patient by patient basis and is often decided by physician if they are comfortable proceeding with procedure. Definitely would want glucose below 300 and can try to help increase glucose as needed for hypoglycemia. Patient indicates that his A1C is around 5.1 so there should be no problems anyway, he just wondered what our ranges were.

## 2019-03-28 DIAGNOSIS — Z9989 Dependence on other enabling machines and devices: Secondary | ICD-10-CM

## 2019-03-28 DIAGNOSIS — G4733 Obstructive sleep apnea (adult) (pediatric): Secondary | ICD-10-CM

## 2019-03-29 ENCOUNTER — Telehealth: Payer: Self-pay | Admitting: Internal Medicine

## 2019-03-29 NOTE — Telephone Encounter (Signed)
Spoke with patient regarding Covid-19 screening questions. Covid-19 Screening Questions:   Do you now or have you had a fever in the last 14 days? no   Do you have any respiratory symptoms of shortness of breath or cough now or in the last 14 days? no   Do you have any family members or close contacts with diagnosed or suspected Covid-19 in the past 14 days? no   Have you been tested for Covid-19 and found to be positive? no   Pt made aware of that care partner may wait in the car or come up to the lobby during the procedure but will need to provide their own mask

## 2019-03-30 ENCOUNTER — Encounter: Payer: Self-pay | Admitting: Internal Medicine

## 2019-03-30 ENCOUNTER — Ambulatory Visit (AMBULATORY_SURGERY_CENTER): Payer: BC Managed Care – PPO | Admitting: Internal Medicine

## 2019-03-30 ENCOUNTER — Other Ambulatory Visit: Payer: Self-pay

## 2019-03-30 VITALS — BP 116/64 | HR 47 | Temp 98.5°F | Resp 24 | Ht 70.0 in | Wt 255.0 lb

## 2019-03-30 DIAGNOSIS — K2970 Gastritis, unspecified, without bleeding: Secondary | ICD-10-CM | POA: Diagnosis not present

## 2019-03-30 DIAGNOSIS — D120 Benign neoplasm of cecum: Secondary | ICD-10-CM | POA: Diagnosis not present

## 2019-03-30 DIAGNOSIS — Z1211 Encounter for screening for malignant neoplasm of colon: Secondary | ICD-10-CM | POA: Diagnosis not present

## 2019-03-30 DIAGNOSIS — Z86010 Personal history of colonic polyps: Secondary | ICD-10-CM | POA: Diagnosis not present

## 2019-03-30 DIAGNOSIS — K259 Gastric ulcer, unspecified as acute or chronic, without hemorrhage or perforation: Secondary | ICD-10-CM | POA: Diagnosis not present

## 2019-03-30 DIAGNOSIS — K2950 Unspecified chronic gastritis without bleeding: Secondary | ICD-10-CM | POA: Diagnosis not present

## 2019-03-30 DIAGNOSIS — D123 Benign neoplasm of transverse colon: Secondary | ICD-10-CM

## 2019-03-30 DIAGNOSIS — K7460 Unspecified cirrhosis of liver: Secondary | ICD-10-CM

## 2019-03-30 MED ORDER — PANTOPRAZOLE SODIUM 40 MG PO TBEC: 40.0000 mg | DELAYED_RELEASE_TABLET | Freq: Two times a day (BID) | ORAL | 3 refills | Status: DC

## 2019-03-30 MED ORDER — SODIUM CHLORIDE 0.9 % IV SOLN: 500.0000 mL | Freq: Once | INTRAVENOUS | Status: DC

## 2019-03-30 NOTE — Progress Notes (Signed)
Pt's states no medical or surgical changes since previsit or office visit.  Vitals- Tony Cantu- June

## 2019-03-30 NOTE — Progress Notes (Signed)
Pt desat d/t OSA, Nasal trumpet placed and sats quickly recovered.Lip pinched from bite block, ice appliedtb

## 2019-03-30 NOTE — Op Note (Signed)
[facility] Patient Name: Tony Cantu Procedure Date: 03/30/2019 7:20 AM MRN: [id number] Endoscopist: Jerene Bears , MD Age: 68 Referring MD:  Date of Birth: [birth year] Gender: Male Account #: [id number] Procedure:                Colonoscopy Indications:              Surveillance: Personal history of adenomatous                            polyps on last colonoscopy 5 years ago Medicines:                Monitored Anesthesia Care Procedure:                Pre-Anesthesia Assessment:                           - Prior to the procedure, a History and Physical                            was performed, and patient medications and                            allergies were reviewed. The patient's tolerance of                            previous anesthesia was also reviewed. The risks                            and benefits of the procedure and the sedation                            options and risks were discussed with the patient.                            All questions were answered, and informed consent                            was obtained. Prior Anticoagulants: The patient has                            taken no previous anticoagulant or antiplatelet                            agents. ASA Grade Assessment: III - A patient with                            severe systemic disease. After reviewing the risks                            and benefits, the patient was deemed in                            satisfactory condition to undergo the procedure.  After obtaining informed consent, the colonoscope                            was passed under direct vision. Throughout the                            procedure, the patient's blood pressure, pulse, and                            oxygen saturations were monitored continuously. The                            Colonoscope was introduced through the anus and                            advanced to the cecum,  identified by appendiceal                            orifice and ileocecal valve. The colonoscopy was                            performed without difficulty. The patient tolerated                            the procedure well. The quality of the bowel                            preparation was good. The ileocecal valve,                            appendiceal orifice, and rectum were photographed. Scope In: 8:19:06 AM Scope Out: 8:38:17 AM Scope Withdrawal Time: 0 hours 15 minutes 29 seconds  Total Procedure Duration: 0 hours 19 minutes 11 seconds  Findings:                 The digital rectal exam was normal.                           Two sessile polyps were found in the cecum. The                            polyps were 3 to 4 mm in size. These polyps were                            removed with a cold snare. Resection and retrieval                            were complete.                           A 6 mm polyp was found in the ileocecal valve. The                            polyp was sessile. The  polyp was removed with a                            cold snare. Resection and retrieval were complete.                           A 3 mm polyp was found in the transverse colon. The                            polyp was sessile. The polyp was removed with a                            cold snare. Resection was complete, but the polyp                            tissue was not retrieved.                           Multiple small and large-mouthed diverticula were                            found in the sigmoid colon and descending colon.                           The retroflexed view of the distal rectum and anal                            verge was normal and showed no anal or rectal                            abnormalities. Complications:            No immediate complications. Estimated Blood Loss:     Estimated blood loss was minimal. Impression:               - Two 3 to 4 mm polyps in the cecum,  removed with a                            cold snare. Resected and retrieved.                           - One 6 mm polyp at the ileocecal valve, removed                            with a cold snare. Resected and retrieved.                           - One 3 mm polyp in the transverse colon, removed                            with a cold snare. Complete resection. Polyp tissue  not retrieved.                           - Severe diverticulosis in the sigmoid colon and in                            the descending colon.                           - The distal rectum and anal verge are normal on                            retroflexion view. Recommendation:           - Patient has a contact number available for                            emergencies. The signs and symptoms of potential                            delayed complications were discussed with the                            patient. Return to normal activities tomorrow.                            Written discharge instructions were provided to the                            patient.                           - Resume previous diet.                           - Continue present medications.                           - Await pathology results.                           - Repeat colonoscopy is recommended for                            surveillance. The colonoscopy date will be                            determined after pathology results from today's                            exam become available for review. Jerene Bears, MD 03/30/2019 8:48:13 AM This report has been signed electronically.

## 2019-03-30 NOTE — Patient Instructions (Signed)
Information on polyps given to you today.  Await pathology results.  Increase pantoprazole to 40 mg twice a day for 1 month then once a day thereafter.  Avoid NSAIDS - aspirin, ibuprofen, naproxen, etc.  YOU HAD AN ENDOSCOPIC PROCEDURE TODAY AT [facility]:   Refer to the procedure report that was given to you for any specific questions about what was found during the examination.  If the procedure report does not answer your questions, please call your gastroenterologist to clarify.  If you requested that your care partner not be given the details of your procedure findings, then the procedure report has been included in a sealed envelope for you to review at your convenience later.  YOU SHOULD EXPECT: Some feelings of bloating in the abdomen. Passage of more gas than usual.  Walking can help get rid of the air that was put into your GI tract during the procedure and reduce the bloating. If you had a lower endoscopy (such as a colonoscopy or flexible sigmoidoscopy) you may notice spotting of blood in your stool or on the toilet paper. If you underwent a bowel prep for your procedure, you may not have a normal bowel movement for a few days.  Please Note:  You might notice some irritation and congestion in your nose or some drainage.  This is from the oxygen used during your procedure.  There is no need for concern and it should clear up in a day or so.  SYMPTOMS TO REPORT IMMEDIATELY:   Following lower endoscopy (colonoscopy or flexible sigmoidoscopy):  Excessive amounts of blood in the stool  Significant tenderness or worsening of abdominal pains  Swelling of the abdomen that is new, acute  Fever of 100F or higher   Following upper endoscopy (EGD)  Vomiting of blood or coffee ground material  New chest pain or pain under the shoulder blades  Painful or persistently difficult swallowing  New shortness of breath  Fever of 100F or higher  Black, tarry-looking  stools  For urgent or emergent issues, a gastroenterologist can be reached at any hour by calling [phone].   DIET:  We do recommend a small meal at first, but then you may proceed to your regular diet.  Drink plenty of fluids but you should avoid alcoholic beverages for 24 hours.  ACTIVITY:  You should plan to take it easy for the rest of today and you should NOT DRIVE or use heavy machinery until tomorrow (because of the sedation medicines used during the test).    FOLLOW UP: Our staff will call the number listed on your records 48-72 hours following your procedure to check on you and address any questions or concerns that you may have regarding the information given to you following your procedure. If we do not reach you, we will leave a message.  We will attempt to reach you two times.  During this call, we will ask if you have developed any symptoms of COVID 19. If you develop any symptoms (ie: fever, flu-like symptoms, shortness of breath, cough etc.) before then, please call [phone].  If you test positive for Covid 19 in the 2 weeks post procedure, please call and report this information to [place].    If any biopsies were taken you will be contacted by phone or by letter within the next 1-3 weeks.  Please call us at [phone] if you have not heard about the biopsies in 3 weeks.    SIGNATURES/CONFIDENTIALITY: You and/or  your care partner have signed paperwork which will be entered into your electronic medical record.  These signatures attest to the fact that that the information above on your After Visit Summary has been reviewed and is understood.  Full responsibility of the confidentiality of this discharge information lies with you and/or your care-partner.

## 2019-03-30 NOTE — Op Note (Signed)
[facility] Patient Name: Tony Cantu Procedure Date: 03/30/2019 7:56 AM MRN: [id number] Endoscopist: Jerene Bears , MD Age: 68 Referring MD:  Date of Birth: March 06, 1951 Gender: Male Account #: [id number] Procedure:                Upper GI endoscopy Indications:              Cirrhosis rule out esophageal varices Medicines:                Monitored Anesthesia Care Procedure:                Pre-Anesthesia Assessment:                           - Prior to the procedure, a History and Physical                            was performed, and patient medications and                            allergies were reviewed. The patient's tolerance of                            previous anesthesia was also reviewed. The risks                            and benefits of the procedure and the sedation                            options and risks were discussed with the patient.                            All questions were answered, and informed consent                            was obtained. Prior Anticoagulants: The patient has                            taken no previous anticoagulant or antiplatelet                            agents. ASA Grade Assessment: III - A patient with                            severe systemic disease. After reviewing the risks                            and benefits, the patient was deemed in                            satisfactory condition to undergo the procedure.                           After obtaining informed consent, the endoscope was  passed under direct vision. Throughout the                            procedure, the patient's blood pressure, pulse, and                            oxygen saturations were monitored continuously. The                            Endoscope was introduced through the mouth, and                            advanced to the second part of duodenum. The upper                            GI endoscopy was  accomplished without difficulty.                            The patient tolerated the procedure well. Scope In: Scope Out: Findings:                 The examined esophagus was normal. No esophageal                            varices.                           One non-bleeding cratered gastric ulcer with no                            stigmata of bleeding was found in the prepyloric                            gastric antrum. Biopsies were taken with a cold                            forceps for histology and Helicobacter pylori                            testing.                           The examined duodenum was normal. Complications:            No immediate complications. Estimated Blood Loss:     Estimated blood loss was minimal. Impression:               - Normal esophagus.                           - Non-bleeding gastric ulcer with no stigmata of                            bleeding. Biopsied.                           - Normal examined duodenum.                           -  No evidence of esophageal or gastric varices. Recommendation:           - Patient has a contact number available for                            emergencies. The signs and symptoms of potential                            delayed complications were discussed with the                            patient. Return to normal activities tomorrow.                            Written discharge instructions were provided to the                            patient.                           - Resume previous diet.                           - Continue present medications. Increase                            pantoprazole to 40 mg twice daily x 1 month, then                            once daily thereafter. Avoid NSAIDs.                           - Await pathology results.                           - Repeat upper endoscopy in 2 years for variceal                            screening purposes. Jerene Bears, MD 03/30/2019 8:44:36  AM This report has been signed electronically.

## 2019-03-30 NOTE — Progress Notes (Signed)
Called to room to assist during endoscopic procedure.  Patient ID and intended procedure confirmed with present staff. Received instructions for my participation in the procedure from the performing physician.  

## 2019-03-30 NOTE — Progress Notes (Signed)
To PACU, VSS. Report to Rn.tb 

## 2019-03-30 NOTE — Addendum Note (Signed)
Addended by: Saunders Revel on: 03/30/2019 12:19 PM   Modules accepted: Orders

## 2019-04-03 ENCOUNTER — Telehealth: Payer: Self-pay

## 2019-04-03 NOTE — Telephone Encounter (Signed)
No answer, left message to call if having any issues or concerns, B.Cristal Qadir RN

## 2019-04-04 ENCOUNTER — Encounter: Payer: Self-pay | Admitting: Internal Medicine

## 2019-04-09 NOTE — Progress Notes (Signed)
Reviewed and agree with assessment/plan.   Chelsae Zanella, MD [facility] 08/24/2016, 12:24 PM Pager:  [phone]  

## 2019-04-12 ENCOUNTER — Other Ambulatory Visit: Payer: Self-pay | Admitting: Family Medicine

## 2019-04-12 ENCOUNTER — Telehealth: Payer: Self-pay | Admitting: General Surgery

## 2019-04-12 NOTE — Telephone Encounter (Signed)
Received e-message from patient stating he had not been contacted regarding his cpap supplies. [place] and the rep stated she had his paperwork in her hand and was going to call the patient. She needed to know the detail regarding the type of mask used.  I advised I would forward a message back to the patient to let him know he would be contacted. Nothing further needed at this time.

## 2019-04-14 ENCOUNTER — Encounter: Payer: Self-pay | Admitting: Family Medicine

## 2019-04-15 ENCOUNTER — Other Ambulatory Visit: Payer: Self-pay | Admitting: *Deleted

## 2019-04-15 MED ORDER — TOUJEO MAX SOLOSTAR 300 UNIT/ML ~~LOC~~ SOPN: 120.0000 [IU] | PEN_INJECTOR | Freq: Every day | SUBCUTANEOUS | 3 refills | Status: DC

## 2019-04-16 ENCOUNTER — Other Ambulatory Visit: Payer: Self-pay | Admitting: Family Medicine

## 2019-04-23 NOTE — Progress Notes (Signed)
`      Cardiology Office Note   Date:  04/24/2019   ID:  Tony Cantu, DOB June 21, 1951, MRN [id number]  PCP:  Baruch Gouty, FNP  Cardiologist:   Minus Breeding, MD  Referring:  Baruch Gouty, FNP  Chief Complaint  Patient presents with  . Aortic Stenosis      History of Present Illness: Tony Cantu is a 68 y.o. male who presents for evaluation of arrhythmia.   He was previously seen by Dr. Aundra Dubin.  He had a cardiolite in 2009 that was a normal study. He uses CPAP for his sleep apnea. He has mild carotid disease.  The patient was admitted previously  with sepsis.  He had new onset atrial fib with RVR.  He was treated with Xarelto.  However, I stopped this as this appeared to be an isolated event.   Since I last saw him he has done well.  He had shoulder surgery and has not been as active since this.  He has done some yard work it sounds like this is mostly riding a Therapist, music.  He says he is been a little walking.  He has not had any palpitations, presyncope or syncope.  He denies any chest pressure, neck or arm discomfort.  He has had no weight gain or edema.    Past Medical History:  Diagnosis Date  . Arthritis   . Asthma   . BPH (benign prostatic hypertrophy)   . Colon polyps   . Diabetes mellitus without complication (HCC)    TYPE 1   . Gastritis   . GERD (gastroesophageal reflux disease)   . Headache    HX  . Hypertension   . PONV (postoperative nausea and vomiting)   . Seizures (HCC)    HYPOGLYCEMIC LAST 1 AND 1/2 YRS AGO  . Sleep apnea    uses CPAP nightly  . Testicle trouble    one testicle BORN WITH    Past Surgical History:  Procedure Laterality Date  . BACK SURGERY     89  LOWER   . CARPAL TUNNEL RELEASE Right 10/13/2015   Procedure: RIGHT CARPAL TUNNEL RELEASE;  Surgeon: Daryll Brod, MD;  Location: [facility];  Service: Orthopedics;  Laterality: Right;  . CARPAL TUNNEL RELEASE Left 07/05/2016   Procedure: LEFT CARPAL TUNNEL RELEASE;   Surgeon: Daryll Brod, MD;  Location: [facility];  Service: Orthopedics;  Laterality: Left;  . HERNIA REPAIR    . INGUINAL HERNIA REPAIR  2003   right   . KNEE ARTHROSCOPY Right 03/03/2016  . LUMBAR DISC SURGERY  8/96   Dr. Coralyn Mark, discectomy  . LUMBAR LAMINECTOMY/DECOMPRESSION MICRODISCECTOMY Right 10/06/2016   Procedure: RIGHT LUMBAR THREE - LUMBAR FOUR  LAMINECTOMY, FORAMINOTOMY AND MICRODISCECTOMY;  Surgeon: Jovita Gamma, MD;  Location: [facility];  Service: Neurosurgery;  Laterality: Right;  . SHOULDER SURGERY  11/28/05   left partial  . SHOULDER SURGERY  07/14/2006   RIGHT  . TONSILLECTOMY  AGE 46 OR 4  . TOTAL SHOULDER ARTHROPLASTY Right 11/01/2018   Procedure: RIGHT SHOULDER REVISION TO REVERSE TOTAL SHOULDER;  Surgeon: Tania Ade, MD;  Location: [facility];  Service: Orthopedics;  Laterality: Right;  CHOICE ANESTHESIA WITH INTERSCALENE BLOCK EXPAREL, NEEDS RNFA     Current Outpatient Medications  Medication Sig Dispense Refill  . albuterol (PROVENTIL HFA;VENTOLIN HFA) 108 (90 Base) MCG/ACT inhaler Inhale 2 puffs into the lungs every 6 (six) hours as needed for wheezing or shortness of breath.  3 Inhaler 1  . aspirin 81 MG chewable tablet Chew by mouth daily.    Marland Kitchen atorvastatin (LIPITOR) 40 MG tablet TAKE 1 TABLET BY MOUTH  DAILY 90 tablet 1  . b complex vitamins tablet Take 1 tablet by mouth daily at 12 noon.     . Calcium Carbonate-Vitamin D 600-400 MG-UNIT tablet Take 1 tablet by mouth daily.    . Cholecalciferol (VITAMIN D3) 2000 UNITS capsule Take 2,000 Units by mouth daily at 12 noon.     . fexofenadine (ALLEGRA) 180 MG tablet Take 1 tablet (180 mg total) by mouth daily with breakfast. 90 tablet 3  . Flaxseed, Linseed, (FLAX SEED OIL PO) Take 1,300 mg by mouth daily with breakfast.     . furosemide (LASIX) 20 MG tablet TAKE 1 AND 1/2 TABLETS BY  MOUTH DAILY 135 tablet 1  . Glucosamine-Chondroit-Vit C-Mn (GLUCOSAMINE 1500 COMPLEX) CAPS Take 2 capsules by mouth daily  at 12 noon.     Marland Kitchen HUMALOG 100 UNIT/ML injection INJECT 20 TO 50 UNITS  SUBCUTANEOUSLY 3 TIMES  DAILY BEFORE MEALS PER SLIDING SCALE 140 mL 2  . Insulin Glargine, 2 Unit Dial, (TOUJEO MAX SOLOSTAR) 300 UNIT/ML SOPN Inject 120 Units into the skin daily. 45 mL 3  . montelukast (SINGULAIR) 10 MG tablet TAKE 1 TABLET BY MOUTH AT  BEDTIME 90 tablet 1  . multivitamin-lutein (OCUVITE-LUTEIN) CAPS capsule Take 1 capsule by mouth daily at 12 noon.    . pantoprazole (PROTONIX) 40 MG tablet TAKE 1 TABLET BY MOUTH  DAILY 90 tablet 1  . sildenafil (REVATIO) 20 MG tablet TAKE 2-5 TABLETS AS NEEDED PRIOR TO SEXUAL ACTIVITY 50 tablet 1  . Testosterone 30 MG/ACT SOLN USE 1 PUMP UNDER EACH  AXILLA DAILY (TOTAL OF 60MG DAILY) 270 mL 1  . valsartan (DIOVAN) 160 MG tablet TAKE 1 TABLET BY MOUTH  DAILY 90 tablet 1  . Continuous Blood Gluc Receiver (FREESTYLE LIBRE READER) DEVI 1 applicator by Does not apply route as directed. 1 Device 1  . Continuous Blood Gluc Sensor (FREESTYLE LIBRE SENSOR SYSTEM) MISC Check BS eight (8) times a day. Dx E10.9 3 each 3  . GLUCAGON EMERGENCY 1 MG injection INJECT 1MG INTO SKIN ONCE  AS NEEDED (Patient not taking: No sig reported) 3 each 1   No current facility-administered medications for this visit.     Allergies:   Erythromycin and Sulfonamide derivatives     ROS:  Please see the history of present illness.   Otherwise, review of systems are positive for none.   All other systems are reviewed and negative.    PHYSICAL EXAM: VS:  BP 128/70   Pulse 68   Ht 5' 10"  (1.778 m)   Wt 249 lb (112.9 kg)   BMI 35.73 kg/m  , BMI Body mass index is 35.73 kg/m.  GENERAL:  Well appearing NECK:  No jugular venous distention, waveform within normal limits, carotid upstroke brisk and symmetric, bilateral bruits, no thyromegaly LUNGS:  Clear to auscultation bilaterally CHEST:  Unremarkable HEART:  PMI not displaced or sustained,S1 and S2 within normal limits, no S3, no S4, no clicks, no  rubs, 3 out of 6 systolic murmur radiating slightly at the aortic outflow tract, no diastolic murmurs ABD:  Flat, positive bowel sounds normal in frequency in pitch, no bruits, no rebound, no guarding, no midline pulsatile mass, no hepatomegaly, no splenomegaly EXT:  2 plus pulses throughout, no edema, no cyanosis no clubbing   EKG:  EKG is  not ordered today.   Recent Labs: 02/27/2019: ALT 43; BUN 14; Hemoglobin 15.5; Platelets 153; Potassium 4.5; Sodium 140 03/04/2019: Creatinine, Ser 1.00    Lipid Panel    Component Value Date/Time   CHOL 106 01/15/2019 0807   CHOL 107 01/07/2013 0840   TRIG 45 01/15/2019 0807   TRIG 59 03/14/2017 0802   TRIG 47 01/07/2013 0840   HDL 52 01/15/2019 0807   HDL 49 03/14/2017 0802   HDL 53 01/07/2013 0840   CHOLHDL 2.0 01/15/2019 0807   LDLCALC 45 01/15/2019 0807   LDLCALC 44 01/03/2014 0803   LDLCALC 45 01/07/2013 0840     Lab Results  Component Value Date   HGBA1C 5.1 01/15/2019    Wt Readings from Last 3 Encounters:  04/24/19 249 lb (112.9 kg)  03/30/19 255 lb (115.7 kg)  02/26/19 255 lb (115.7 kg)      Other studies Reviewed: Additional studies/ records that were reviewed today include: None Review of the above records demonstrates:      ASSESSMENT AND PLAN:   Aortic stenosis:    This was mild.  By exam it does not seem like it is changed.  I will follow this with physical exams and consider repeat imaging in the future.   Hyperlipidemia:   LDL was 45 recently.  No change in therapy.  Questionable h/o Afib:    I did not see evidence of this as it appeared to be multifocal atrial tachycardia when he was acutely ill.  Has had no symptomatic recurrence.  No further testing is indicated.  He does not have an indication for anticoagulation.   OSA: on CPAP:   He will continue meds as listed.  Carotid stenosis: He had mild carotid stenosis 5 years ago.  I am going to check carotid Dopplers bilaterally.  Current medicines are  reviewed at length with the patient today.  The patient does not have concerns regarding medicines.  The following changes have been made:  None  Labs/ tests ordered today include:  None  Orders Placed This Encounter  Procedures  . VAS US CAROTID     Disposition:   FU with me in 12  months.      Signed, Minus Breeding, MD  04/24/2019 2:12 PM    [facility]

## 2019-04-24 ENCOUNTER — Other Ambulatory Visit: Payer: Self-pay

## 2019-04-24 ENCOUNTER — Ambulatory Visit (INDEPENDENT_AMBULATORY_CARE_PROVIDER_SITE_OTHER): Payer: BC Managed Care – PPO | Admitting: Cardiology

## 2019-04-24 ENCOUNTER — Encounter: Payer: Self-pay | Admitting: Cardiology

## 2019-04-24 VITALS — BP 128/70 | HR 68 | Ht 70.0 in | Wt 249.0 lb

## 2019-04-24 DIAGNOSIS — I480 Paroxysmal atrial fibrillation: Secondary | ICD-10-CM

## 2019-04-24 DIAGNOSIS — I350 Nonrheumatic aortic (valve) stenosis: Secondary | ICD-10-CM

## 2019-04-24 DIAGNOSIS — E785 Hyperlipidemia, unspecified: Secondary | ICD-10-CM

## 2019-04-24 DIAGNOSIS — I6523 Occlusion and stenosis of bilateral carotid arteries: Secondary | ICD-10-CM

## 2019-04-24 NOTE — Patient Instructions (Signed)
Medication Instructions:  The current medical regimen is effective;  continue present plan and medications.  If you need a refill on your cardiac medications before your next appointment, please call your pharmacy.   Testing/Procedures: Your physician has requested that you have a carotid duplex. This test is an ultrasound of the carotid arteries in your neck. It looks at blood flow through these arteries that supply the brain with blood. Allow one hour for this exam. There are no restrictions or special instructions.  You will be contacted to be scheduled for this testing.  Follow-Up: Follow up in 1 year with Dr. Percival Spanish.  You will receive a letter in the mail 2 months before you are due.  Please call us when you receive this letter to schedule your follow up appointment.   Thank you for choosing [facility]!!

## 2019-04-25 ENCOUNTER — Encounter: Payer: Self-pay | Admitting: Family Medicine

## 2019-04-25 ENCOUNTER — Other Ambulatory Visit: Payer: Self-pay

## 2019-04-25 DIAGNOSIS — E0843 Diabetes mellitus due to underlying condition with diabetic autonomic (poly)neuropathy: Secondary | ICD-10-CM

## 2019-04-25 DIAGNOSIS — E782 Mixed hyperlipidemia: Secondary | ICD-10-CM

## 2019-04-25 DIAGNOSIS — I10 Essential (primary) hypertension: Secondary | ICD-10-CM

## 2019-04-25 DIAGNOSIS — D696 Thrombocytopenia, unspecified: Secondary | ICD-10-CM

## 2019-04-29 ENCOUNTER — Other Ambulatory Visit: Payer: Self-pay

## 2019-04-29 ENCOUNTER — Ambulatory Visit (HOSPITAL_COMMUNITY)
Admission: RE | Admit: 2019-04-29 | Discharge: 2019-04-29 | Disposition: A | Discharge status: Home / Self Care | Payer: BC Managed Care – PPO | Source: Ambulatory Visit | Attending: Cardiovascular Disease | Admitting: Cardiovascular Disease

## 2019-04-29 DIAGNOSIS — I6523 Occlusion and stenosis of bilateral carotid arteries: Secondary | ICD-10-CM | POA: Insufficient documentation

## 2019-04-30 ENCOUNTER — Other Ambulatory Visit: Payer: BC Managed Care – PPO

## 2019-04-30 DIAGNOSIS — E0843 Diabetes mellitus due to underlying condition with diabetic autonomic (poly)neuropathy: Secondary | ICD-10-CM

## 2019-04-30 DIAGNOSIS — I10 Essential (primary) hypertension: Secondary | ICD-10-CM

## 2019-04-30 DIAGNOSIS — E782 Mixed hyperlipidemia: Secondary | ICD-10-CM | POA: Diagnosis not present

## 2019-04-30 DIAGNOSIS — D696 Thrombocytopenia, unspecified: Secondary | ICD-10-CM | POA: Diagnosis not present

## 2019-04-30 LAB — BAYER DCA HB A1C WAIVED: HB A1C (BAYER DCA - WAIVED): 5 % (ref <7.0)

## 2019-05-01 LAB — CBC WITH DIFFERENTIAL/PLATELET
Basophils Absolute: 0.1 10*3/uL (ref 0.0–0.2)
Basos: 1 %
EOS (ABSOLUTE): 0.2 10*3/uL (ref 0.0–0.4)
Eos: 2 %
Hematocrit: 49.7 % (ref 37.5–51.0)
Hemoglobin: 16.5 g/dL (ref 13.0–17.7)
Immature Grans (Abs): 0 10*3/uL (ref 0.0–0.1)
Immature Granulocytes: 0 %
Lymphocytes Absolute: 1.5 10*3/uL (ref 0.7–3.1)
Lymphs: 24 %
MCH: 31.9 pg (ref 26.6–33.0)
MCHC: 33.2 g/dL (ref 31.5–35.7)
MCV: 96 fL (ref 79–97)
Monocytes Absolute: 1 10*3/uL — ABNORMAL HIGH (ref 0.1–0.9)
Monocytes: 15 %
Neutrophils Absolute: 3.7 10*3/uL (ref 1.4–7.0)
Neutrophils: 58 %
Platelets: 148 10*3/uL — ABNORMAL LOW (ref 150–450)
RBC: 5.17 x10E6/uL (ref 4.14–5.80)
RDW: 14.5 % (ref 11.6–15.4)
WBC: 6.4 10*3/uL (ref 3.4–10.8)

## 2019-05-01 LAB — CMP14+EGFR
ALT: 43 IU/L (ref 0–44)
AST: 39 IU/L (ref 0–40)
Albumin/Globulin Ratio: 1.4 (ref 1.2–2.2)
Albumin: 4.2 g/dL (ref 3.8–4.8)
Alkaline Phosphatase: 72 IU/L (ref 39–117)
BUN/Creatinine Ratio: 12 (ref 10–24)
BUN: 12 mg/dL (ref 8–27)
Bilirubin Total: 0.9 mg/dL (ref 0.0–1.2)
CO2: 22 mmol/L (ref 20–29)
Calcium: 9.5 mg/dL (ref 8.6–10.2)
Chloride: 102 mmol/L (ref 96–106)
Creatinine, Ser: 1.01 mg/dL (ref 0.76–1.27)
GFR calc Af Amer: 87 mL/min/{1.73_m2} (ref >59)
GFR calc non Af Amer: 76 mL/min/{1.73_m2} (ref >59)
Globulin, Total: 3.1 g/dL (ref 1.5–4.5)
Glucose: 133 mg/dL — ABNORMAL HIGH (ref 65–99)
Potassium: 4.6 mmol/L (ref 3.5–5.2)
Sodium: 140 mmol/L (ref 134–144)
Total Protein: 7.3 g/dL (ref 6.0–8.5)

## 2019-05-01 LAB — LIPID PANEL
Chol/HDL Ratio: 2.3 ratio (ref 0.0–5.0)
Cholesterol, Total: 125 mg/dL (ref 100–199)
HDL: 55 mg/dL (ref >39)
LDL Chol Calc (NIH): 57 mg/dL (ref 0–99)
Triglycerides: 59 mg/dL (ref 0–149)
VLDL Cholesterol Cal: 13 mg/dL (ref 5–40)

## 2019-05-01 LAB — HEPATIC FUNCTION PANEL: Bilirubin, Direct: 0.3 mg/dL (ref 0.00–0.40)

## 2019-05-09 DIAGNOSIS — M25551 Pain in right hip: Secondary | ICD-10-CM | POA: Diagnosis not present

## 2019-05-16 ENCOUNTER — Other Ambulatory Visit: Payer: Self-pay

## 2019-05-16 ENCOUNTER — Ambulatory Visit: Payer: BC Managed Care – PPO | Admitting: Family Medicine

## 2019-05-17 ENCOUNTER — Ambulatory Visit (INDEPENDENT_AMBULATORY_CARE_PROVIDER_SITE_OTHER): Payer: BC Managed Care – PPO | Admitting: Family Medicine

## 2019-05-17 ENCOUNTER — Encounter: Payer: Self-pay | Admitting: Family Medicine

## 2019-05-17 VITALS — BP 92/56 | HR 73 | Temp 99.0°F | Resp 20 | Ht 70.0 in | Wt 242.0 lb

## 2019-05-17 DIAGNOSIS — Z23 Encounter for immunization: Secondary | ICD-10-CM | POA: Diagnosis not present

## 2019-05-17 DIAGNOSIS — K2100 Gastro-esophageal reflux disease with esophagitis, without bleeding: Secondary | ICD-10-CM | POA: Insufficient documentation

## 2019-05-17 DIAGNOSIS — D696 Thrombocytopenia, unspecified: Secondary | ICD-10-CM | POA: Diagnosis not present

## 2019-05-17 DIAGNOSIS — E1159 Type 2 diabetes mellitus with other circulatory complications: Secondary | ICD-10-CM | POA: Diagnosis not present

## 2019-05-17 DIAGNOSIS — E1169 Type 2 diabetes mellitus with other specified complication: Secondary | ICD-10-CM

## 2019-05-17 DIAGNOSIS — I10 Essential (primary) hypertension: Secondary | ICD-10-CM

## 2019-05-17 DIAGNOSIS — N182 Chronic kidney disease, stage 2 (mild): Secondary | ICD-10-CM

## 2019-05-17 DIAGNOSIS — E785 Hyperlipidemia, unspecified: Secondary | ICD-10-CM

## 2019-05-17 DIAGNOSIS — J301 Allergic rhinitis due to pollen: Secondary | ICD-10-CM

## 2019-05-17 DIAGNOSIS — E1122 Type 2 diabetes mellitus with diabetic chronic kidney disease: Secondary | ICD-10-CM | POA: Diagnosis not present

## 2019-05-17 DIAGNOSIS — Z794 Long term (current) use of insulin: Secondary | ICD-10-CM

## 2019-05-17 DIAGNOSIS — R600 Localized edema: Secondary | ICD-10-CM

## 2019-05-17 DIAGNOSIS — I152 Hypertension secondary to endocrine disorders: Secondary | ICD-10-CM

## 2019-05-17 DIAGNOSIS — I700 Atherosclerosis of aorta: Secondary | ICD-10-CM | POA: Diagnosis not present

## 2019-05-17 DIAGNOSIS — J329 Chronic sinusitis, unspecified: Secondary | ICD-10-CM | POA: Insufficient documentation

## 2019-05-17 DIAGNOSIS — I350 Nonrheumatic aortic (valve) stenosis: Secondary | ICD-10-CM

## 2019-05-17 DIAGNOSIS — K219 Gastro-esophageal reflux disease without esophagitis: Secondary | ICD-10-CM

## 2019-05-17 MED ORDER — FUROSEMIDE 20 MG PO TABS: 30.0000 mg | ORAL_TABLET | Freq: Every day | ORAL | 1 refills | Status: DC

## 2019-05-17 MED ORDER — MONTELUKAST SODIUM 10 MG PO TABS: 10.0000 mg | ORAL_TABLET | Freq: Every day | ORAL | 1 refills | Status: DC

## 2019-05-17 MED ORDER — INSULIN LISPRO 100 UNIT/ML ~~LOC~~ SOLN: SUBCUTANEOUS | 2 refills | Status: DC

## 2019-05-17 MED ORDER — PANTOPRAZOLE SODIUM 40 MG PO TBEC: 40.0000 mg | DELAYED_RELEASE_TABLET | Freq: Every day | ORAL | 1 refills | Status: DC

## 2019-05-17 MED ORDER — TOUJEO MAX SOLOSTAR 300 UNIT/ML ~~LOC~~ SOPN: 120.0000 [IU] | PEN_INJECTOR | Freq: Every day | SUBCUTANEOUS | 3 refills | Status: DC

## 2019-05-17 NOTE — Patient Instructions (Signed)

## 2019-05-17 NOTE — Progress Notes (Signed)
Subjective:  Patient ID: Tony Cantu, male    DOB: 1950-09-11, 68 y.o.   MRN: [id number]  Patient Care Team: Baruch Gouty, FNP as PCP - General (Family Medicine) Minus Breeding, MD as PCP - Cardiology (Cardiology) Larey Dresser, MD (Cardiology) Clance, Armando Reichert, MD (Pulmonary Disease) Teena Irani, MD (Inactive) (Gastroenterology)   Chief Complaint:  Medical Management of Chronic Issues (4 mo ), Diabetes, Hypertension, and Hyperlipidemia   HPI: Tony Cantu is a 68 y.o. male presenting on 05/17/2019 for Medical Management of Chronic Issues (4 mo ), Diabetes, Hypertension, and Hyperlipidemia   1. Controlled type 2 diabetes mellitus with stage 2 chronic kidney disease, with long-term current use of insulin (HCC)  Pt presents for follow up evaluation of Type 2 diabetes mellitus.  Current symptoms include none and have been stable. Patient denies foot ulcerations, hyperglycemia, hypoglycemia , increased appetite, nausea, paresthesia of the feet, polydipsia, polyuria, visual disturbances, vomiting and weight loss.  Current diabetic medications include toujeo, humalog.  Compliant with meds - Yes  Current monitoring regimen: home blood tests - several times daily Home blood sugar records: trend: fluctuating a bit Any episodes of hypoglycemia? yes - am, lowest 15  Known diabetic complications: peripheral neuropathy, impotence, cardiovascular disease and peripheral vascular disease Cardiovascular risk factors: advanced age (older than 34 for men, 39 for women), diabetes mellitus, dyslipidemia, hypertension, male gender, obesity (BMI >= 30 kg/m2) and sedentary lifestyle Eye exam current (within one year): yes Podiatry yearly?  Yes Weight trend: stable Prior visit with CDE: no Current diet: in general, a "healthy" diet   Current exercise: walking  PNA Vaccine UTD?  No Hep B Vaccine?  Yes Tdap Vaccine UTD?  Yes Urine microalbumin UTD? Yes  Is He on ACE inhibitor or  angiotensin II receptor blocker?  Yes  Valsartan Is He on statin? Yes atorvastatin Is He on ASA 81 mg daily?  Yes    2. Aortic atherosclerosis (HCC)  On ASA and statin therapy. Followed by cardiology.    3. Thrombocytopenia (HCC)  Chronic. No abnormal bleeding or bruising. No petechial rash or gum bleeding.    4. Hypertension associated with type 2 diabetes mellitus ([place])  Complaint with meds - Yes Current Medications - lasix, valsartan Checking BP at home ranging 110/70 Exercising Regularly - Yes Watching Salt intake - Yes Pertinent ROS:  Headache - No Fatigue - No Visual Disturbances - No Chest pain - No Dyspnea - No Palpitations - No LE edema - Yes They report good compliance with medications and can restate their regimen by memory. No medication side effects.  Family, social, and smoking history reviewed.   BP Readings from Last 3 Encounters:  05/17/19 (!) 92/56  04/24/19 128/70  03/30/19 116/64   CMP Latest Ref Rng & Units 04/30/2019 03/04/2019 02/27/2019  Glucose 65 - 99 mg/dL 133(H) - 70  BUN 8 - 27 mg/dL 12 - 14  Creatinine 0.76 - 1.27 mg/dL 1.01 1.00 0.97  Sodium 134 - 144 mmol/L 140 - 140  Potassium 3.5 - 5.2 mmol/L 4.6 - 4.5  Chloride 96 - 106 mmol/L 102 - 101  CO2 20 - 29 mmol/L 22 - 22  Calcium 8.6 - 10.2 mg/dL 9.5 - 9.4  Total Protein 6.0 - 8.5 g/dL 7.3 - 7.2  Total Bilirubin 0.0 - 1.2 mg/dL 0.9 - 0.7  Alkaline Phos 39 - 117 IU/L 72 - 66  AST 0 - 40 IU/L 39 - 44(H)  ALT 0 - 44  IU/L 43 - 43      5. Hyperlipidemia associated with type 2 diabetes mellitus ([place])  Compliant with medications - Yes Current medications - atorvastatin  Side effects from medications - No Diet - generally healthy  Exercise - walks daily  Lab Results  Component Value Date   CHOL 125 04/30/2019   HDL 55 04/30/2019   LDLCALC 45 01/15/2019   TRIG 59 04/30/2019   CHOLHDL 2.3 04/30/2019     Family and personal medical history reviewed. Smoking and ETOH history reviewed.     6. Localized edema  Intermittent lower extremity swelling. Takes Lasix once daily if needed for edema. Does not take on a daily basis.    7. Gastroesophageal reflux disease without esophagitis  Compliant with medications - Yes Current medications - protonix Adverse side effects - No Cough - Yes, rhinitis Sore throat - No Voice change - No Hemoptysis - No Dysphagia or dyspepsia - No Water brash - No Red Flags (weight loss, hematochezia, melena, weight loss, early satiety, fevers, odynophagia, or persistent vomiting) - No   8. Nonrheumatic aortic valve stenosis  Followed by cardiology. No chest pain, shortness of breath, palpitations, dizziness, weakness, or syncope.    9. Seasonal allergic rhinitis due to pollen  Well controlled with Singulair and allegra. Does have intermittent cough. No fever, chills, weakness, or shortness of breath.      Relevant past medical, surgical, family, and social history reviewed and updated as indicated.  Allergies and medications reviewed and updated. Date reviewed: Chart in Epic.   Past Medical History:  Diagnosis Date  . Arthritis   . Asthma   . BPH (benign prostatic hypertrophy)   . Colon polyps   . Diabetes mellitus without complication (HCC)    TYPE 1   . Gastritis   . GERD (gastroesophageal reflux disease)   . Headache    HX  . Hypertension   . PONV (postoperative nausea and vomiting)   . Seizures (HCC)    HYPOGLYCEMIC LAST 1 AND 1/2 YRS AGO  . Sleep apnea    uses CPAP nightly  . Testicle trouble    one testicle BORN WITH    Past Surgical History:  Procedure Laterality Date  . BACK SURGERY     94  LOWER   . CARPAL TUNNEL RELEASE Right 10/13/2015   Procedure: RIGHT CARPAL TUNNEL RELEASE;  Surgeon: Daryll Brod, MD;  Location: [facility];  Service: Orthopedics;  Laterality: Right;  . CARPAL TUNNEL RELEASE Left 07/05/2016   Procedure: LEFT CARPAL TUNNEL RELEASE;  Surgeon: Daryll Brod, MD;  Location: [facility];  Service: Orthopedics;  Laterality: Left;  . HERNIA REPAIR    . INGUINAL HERNIA REPAIR  2003   right   . KNEE ARTHROSCOPY Right 03/03/2016  . LUMBAR DISC SURGERY  8/96   Dr. Coralyn Mark, discectomy  . LUMBAR LAMINECTOMY/DECOMPRESSION MICRODISCECTOMY Right 10/06/2016   Procedure: RIGHT LUMBAR THREE - LUMBAR FOUR  LAMINECTOMY, FORAMINOTOMY AND MICRODISCECTOMY;  Surgeon: Jovita Gamma, MD;  Location: [facility];  Service: Neurosurgery;  Laterality: Right;  . SHOULDER SURGERY  11/28/05   left partial  . SHOULDER SURGERY  07/14/2006   RIGHT  . TONSILLECTOMY  AGE 80 OR 4  . TOTAL SHOULDER ARTHROPLASTY Right 11/01/2018   Procedure: RIGHT SHOULDER REVISION TO REVERSE TOTAL SHOULDER;  Surgeon: Tania Ade, MD;  Location: [facility];  Service: Orthopedics;  Laterality: Right;  CHOICE ANESTHESIA WITH INTERSCALENE BLOCK EXPAREL, NEEDS RNFA    Social History  Socioeconomic History  . Marital status: Married    Spouse name: Not on file  . Number of children: Not on file  . Years of education: Not on file  . Highest education level: Not on file  Occupational History  . Occupation: retired   Scientific laboratory technician  . Financial resource strain: Not on file  . Food insecurity    Worry: Not on file    Inability: Not on file  . Transportation needs    Medical: Not on file    Non-medical: Not on file  Tobacco Use  . Smoking status: Former Smoker    Years: 34.00    Types: Cigarettes, Pipe  . Smokeless tobacco: Never Used  . Tobacco comment: quit 2005 smoked cigarettes for 5 yrs prior to pipe use  Substance and Sexual Activity  . Alcohol use: No    Comment: once a year  . Drug use: No  . Sexual activity: Yes  Lifestyle  . Physical activity    Days per week: Not on file    Minutes per session: Not on file  . Stress: Not on file  Relationships  . Social Herbalist on phone: Not on file    Gets together: Not on file    Attends religious service: Not on file    Active member of  club or organization: Not on file    Attends meetings of clubs or organizations: Not on file    Relationship status: Not on file  . Intimate partner violence    Fear of current or ex partner: Not on file    Emotionally abused: Not on file    Physically abused: Not on file    Forced sexual activity: Not on file  Other Topics Concern  . Not on file  Social History Narrative  . Not on file    Outpatient Encounter Medications as of 05/17/2019  Medication Sig  . albuterol (PROVENTIL HFA;VENTOLIN HFA) 108 (90 Base) MCG/ACT inhaler Inhale 2 puffs into the lungs every 6 (six) hours as needed for wheezing or shortness of breath.  Marland Kitchen aspirin 81 MG chewable tablet Chew by mouth daily.  Marland Kitchen atorvastatin (LIPITOR) 40 MG tablet TAKE 1 TABLET BY MOUTH  DAILY  . b complex vitamins tablet Take 1 tablet by mouth daily at 12 noon.   . Calcium Carbonate-Vitamin D 600-400 MG-UNIT tablet Take 1 tablet by mouth daily.  . Cholecalciferol (VITAMIN D3) 2000 UNITS capsule Take 2,000 Units by mouth daily at 12 noon.   . Continuous Blood Gluc Receiver (FREESTYLE LIBRE READER) DEVI 1 applicator by Does not apply route as directed.  . Continuous Blood Gluc Sensor (FREESTYLE LIBRE SENSOR SYSTEM) MISC Check BS eight (8) times a day. Dx E10.9  . fexofenadine (ALLEGRA) 180 MG tablet Take 1 tablet (180 mg total) by mouth daily with breakfast.  . Flaxseed, Linseed, (FLAX SEED OIL PO) Take 1,300 mg by mouth daily with breakfast.   . furosemide (LASIX) 20 MG tablet Take 1.5 tablets (30 mg total) by mouth daily.  . Glucosamine-Chondroit-Vit C-Mn (GLUCOSAMINE 1500 COMPLEX) CAPS Take 2 capsules by mouth daily at 12 noon.   . Insulin Glargine, 2 Unit Dial, (TOUJEO MAX SOLOSTAR) 300 UNIT/ML SOPN Inject 120 Units into the skin daily.  . insulin lispro (HUMALOG) 100 UNIT/ML injection INJECT 20 TO 50 UNITS  SUBCUTANEOUSLY 3 TIMES  DAILY BEFORE MEALS PER SLIDING SCALE  . montelukast (SINGULAIR) 10 MG tablet Take 1 tablet (10 mg total) by  mouth at  bedtime.  . multivitamin-lutein (OCUVITE-LUTEIN) CAPS capsule Take 1 capsule by mouth daily at 12 noon.  . pantoprazole (PROTONIX) 40 MG tablet Take 1 tablet (40 mg total) by mouth daily.  . sildenafil (REVATIO) 20 MG tablet TAKE 2-5 TABLETS AS NEEDED PRIOR TO SEXUAL ACTIVITY  . Testosterone 30 MG/ACT SOLN USE 1 PUMP UNDER EACH  AXILLA DAILY (TOTAL OF 60MG DAILY)  . valsartan (DIOVAN) 160 MG tablet TAKE 1 TABLET BY MOUTH  DAILY  . [DISCONTINUED] furosemide (LASIX) 20 MG tablet TAKE 1 AND 1/2 TABLETS BY  MOUTH DAILY  . [DISCONTINUED] HUMALOG 100 UNIT/ML injection INJECT 20 TO 50 UNITS  SUBCUTANEOUSLY 3 TIMES  DAILY BEFORE MEALS PER SLIDING SCALE  . [DISCONTINUED] Insulin Glargine, 2 Unit Dial, (TOUJEO MAX SOLOSTAR) 300 UNIT/ML SOPN Inject 120 Units into the skin daily.  . [DISCONTINUED] montelukast (SINGULAIR) 10 MG tablet TAKE 1 TABLET BY MOUTH AT  BEDTIME  . [DISCONTINUED] pantoprazole (PROTONIX) 40 MG tablet TAKE 1 TABLET BY MOUTH  DAILY  . GLUCAGON EMERGENCY 1 MG injection INJECT 1MG INTO SKIN ONCE  AS NEEDED (Patient not taking: No sig reported)   No facility-administered encounter medications on file as of 05/17/2019.     Allergies  Allergen Reactions  . Erythromycin Diarrhea  . Sulfonamide Derivatives Diarrhea    Review of Systems  Constitutional: Negative for activity change, appetite change, chills, diaphoresis, fatigue, fever and unexpected weight change.  HENT: Positive for congestion and postnasal drip. Negative for rhinorrhea, sore throat, trouble swallowing and voice change.   Eyes: Negative.  Negative for photophobia and visual disturbance.  Respiratory: Negative for cough, chest tightness and shortness of breath.   Cardiovascular: Negative for chest pain, palpitations and leg swelling.  Gastrointestinal: Negative for abdominal pain, anal bleeding, blood in stool, constipation, diarrhea, nausea, rectal pain and vomiting.  Endocrine: Negative.  Negative for cold  intolerance, heat intolerance, polydipsia, polyphagia and polyuria.  Genitourinary: Negative for decreased urine volume, difficulty urinating, dysuria, flank pain, frequency, hematuria and urgency.  Musculoskeletal: Positive for arthralgias. Negative for myalgias.  Skin: Negative.  Negative for color change and pallor.  Allergic/Immunologic: Negative.   Neurological: Negative for dizziness, tremors, seizures, syncope, facial asymmetry, speech difficulty, weakness, light-headedness, numbness and headaches.  Hematological: Negative.  Does not bruise/bleed easily.  Psychiatric/Behavioral: Negative for agitation, confusion, decreased concentration, hallucinations, sleep disturbance and suicidal ideas.  All other systems reviewed and are negative.       Objective:  BP (!) 92/56   Pulse 73   Temp 99 F (37.2 C)   Resp 20   Ht 5' 10"  (1.778 m)   Wt 242 lb (109.8 kg)   SpO2 95%   BMI 34.72 kg/m    Wt Readings from Last 3 Encounters:  05/17/19 242 lb (109.8 kg)  04/24/19 249 lb (112.9 kg)  03/30/19 255 lb (115.7 kg)    Physical Exam Vitals signs and nursing note reviewed.  Constitutional:      General: He is not in acute distress.    Appearance: Normal appearance. He is well-developed and well-groomed. He is obese. He is not ill-appearing, toxic-appearing or diaphoretic.  HENT:     Head: Normocephalic and atraumatic.     Jaw: There is normal jaw occlusion.     Right Ear: Hearing, tympanic membrane, ear canal and external ear normal.     Left Ear: Hearing, tympanic membrane, ear canal and external ear normal.     Nose: Nose normal.     Mouth/Throat:  Lips: Pink.     Mouth: Mucous membranes are moist.     Pharynx: Oropharynx is clear. Uvula midline.  Eyes:     General: Lids are normal.     Extraocular Movements: Extraocular movements intact.     Conjunctiva/sclera: Conjunctivae normal.     Pupils: Pupils are equal, round, and reactive to light.  Neck:     Musculoskeletal:  Normal range of motion and neck supple.     Thyroid: No thyroid mass, thyromegaly or thyroid tenderness.     Vascular: Carotid bruit (bilateral) present. No JVD.     Trachea: Trachea and phonation normal.  Cardiovascular:     Rate and Rhythm: Normal rate and regular rhythm.     Chest [place]: PMI is not displaced.     Pulses: Normal pulses.     Heart sounds: Murmur present. Systolic murmur present with a grade of 1/6. No friction rub. No gallop.   Pulmonary:     Effort: Pulmonary effort is normal. No respiratory distress.     Breath sounds: Normal breath sounds. No wheezing.  Abdominal:     General: Abdomen is protuberant. Bowel sounds are normal. There is no distension or abdominal bruit.     Palpations: Abdomen is soft. There is no hepatomegaly or splenomegaly.     Tenderness: There is no abdominal tenderness. There is no right CVA tenderness or left CVA tenderness.     Hernia: No hernia is present.  Musculoskeletal:        General: No swelling, tenderness, deformity or signs of injury.     Right shoulder: He exhibits decreased range of motion. He exhibits no tenderness, no bony tenderness, no swelling, no effusion, no crepitus, no deformity, no laceration, no pain, no spasm, normal pulse and normal strength.     Left shoulder: He exhibits decreased range of motion. He exhibits no tenderness, no bony tenderness, no swelling, no effusion, no crepitus, no deformity, no laceration, no pain, no spasm, normal pulse and normal strength.     Cervical back: Normal.     Right upper arm: Normal.     Left upper arm: Normal.     Right lower leg: 1+ Edema present.     Left lower leg: 1+ Edema present.     Comments: Decreased ROM bilateral shoulders, chronic in nature.   Lymphadenopathy:     Cervical: No cervical adenopathy.  Skin:    General: Skin is warm and dry.     Capillary Refill: Capillary refill takes less than 2 seconds.     Coloration: Skin is not cyanotic, jaundiced or pale.      Findings: No rash.  Neurological:     General: No focal deficit present.     Mental Status: He is alert and oriented to person, place, and time.     Cranial Nerves: Cranial nerves are intact.     Sensory: Sensation is intact.     Motor: Motor function is intact.     Coordination: Coordination is intact.     Gait: Gait is intact.     Deep Tendon Reflexes: Reflexes are normal and symmetric.  Psychiatric:        Attention and Perception: Attention and perception normal.        Mood and Affect: Mood and affect normal.        Speech: Speech normal.        Behavior: Behavior normal. Behavior is cooperative.        Thought Content: Thought content normal.  Cognition and Memory: Cognition and memory normal.        Judgment: Judgment normal.     Results for orders placed or performed in visit on 04/30/19  Bayer DCA Hb A1c Waived  Result Value Ref Range   HB A1C (BAYER DCA - WAIVED) 5.0 <7.0 %  Hepatic function panel  Result Value Ref Range   Bilirubin, Direct 0.30 0.00 - 0.40 mg/dL  Lipid panel  Result Value Ref Range   Cholesterol, Total 125 100 - 199 mg/dL   Triglycerides 59 0 - 149 mg/dL   HDL 55 >39 mg/dL   VLDL Cholesterol Cal 13 5 - 40 mg/dL   LDL Chol Calc (NIH) 57 0 - 99 mg/dL   Lipid Comment: CANCELED    Chol/HDL Ratio 2.3 0.0 - 5.0 ratio  CMP14+EGFR  Result Value Ref Range   Glucose 133 (H) 65 - 99 mg/dL   BUN 12 8 - 27 mg/dL   Creatinine, Ser 1.01 0.76 - 1.27 mg/dL   GFR calc non Af Amer 76 >59 mL/min/1.73   GFR calc Af Amer 87 >59 mL/min/1.73   BUN/Creatinine Ratio 12 10 - 24   Sodium 140 134 - 144 mmol/L   Potassium 4.6 3.5 - 5.2 mmol/L   Chloride 102 96 - 106 mmol/L   CO2 22 20 - 29 mmol/L   Calcium 9.5 8.6 - 10.2 mg/dL   Total Protein 7.3 6.0 - 8.5 g/dL   Albumin 4.2 3.8 - 4.8 g/dL   Globulin, Total 3.1 1.5 - 4.5 g/dL   Albumin/Globulin Ratio 1.4 1.2 - 2.2   Bilirubin Total 0.9 0.0 - 1.2 mg/dL   Alkaline Phosphatase 72 39 - 117 IU/L   AST 39 0 - 40  IU/L   ALT 43 0 - 44 IU/L  CBC with Differential/Platelet  Result Value Ref Range   WBC 6.4 3.4 - 10.8 x10E3/uL   RBC 5.17 4.14 - 5.80 x10E6/uL   Hemoglobin 16.5 13.0 - 17.7 g/dL   Hematocrit 49.7 37.5 - 51.0 %   MCV 96 79 - 97 fL   MCH 31.9 26.6 - 33.0 pg   MCHC 33.2 31.5 - 35.7 g/dL   RDW 14.5 11.6 - 15.4 %   Platelets 148 (L) 150 - 450 x10E3/uL   Neutrophils 58 Not Estab. %   Lymphs 24 Not Estab. %   Monocytes 15 Not Estab. %   Eos 2 Not Estab. %   Basos 1 Not Estab. %   Neutrophils Absolute 3.7 1.4 - 7.0 x10E3/uL   Lymphocytes Absolute 1.5 0.7 - 3.1 x10E3/uL   Monocytes Absolute 1.0 (H) 0.1 - 0.9 x10E3/uL   EOS (ABSOLUTE) 0.2 0.0 - 0.4 x10E3/uL   Basophils Absolute 0.1 0.0 - 0.2 x10E3/uL   Immature Granulocytes 0 Not Estab. %   Immature Grans (Abs) 0.0 0.0 - 0.1 x10E3/uL       Pertinent labs & imaging results that were available during my care of the patient were reviewed by me and considered in my medical decision making.  Assessment & Plan:  Bonny was seen today for medical management of chronic issues, diabetes, hypertension and hyperlipidemia.  Diagnoses and all orders for this visit:  Controlled type 2 diabetes mellitus with stage 2 chronic kidney disease, with long-term current use of insulin (HCC) A1C 5.0 today. Will decrease Toujeo to 100 units daily. Pt will continue to monitor blood sugar closely. Pt aware to report any high or low readings. Pt to follow up in 3 months.  -  Insulin Glargine, 2 Unit Dial, (TOUJEO MAX SOLOSTAR) 300 UNIT/ML SOPN; Inject 120 Units into the skin daily. -     insulin lispro (HUMALOG) 100 UNIT/ML injection; INJECT 20 TO 50 UNITS  SUBCUTANEOUSLY 3 TIMES  DAILY BEFORE MEALS PER SLIDING SCALE  Aortic atherosclerosis (HCC) On ASA and statin therapy. Followed by cardiology. Diet and exercise encouraged.   Thrombocytopenia (HCC) No abnormal bleeding or bruising. No petechial rash. Will continue to monitor.   Hypertension associated  with type 2 diabetes mellitus (HCC) BP well controlled. Changes were not made in regimen today. Daily blood pressure log given with instructions on how to fill out and told to bring to next visit. Goal BP 130/80. Pt aware to report any persistent high or low readings. DASH diet and exercise encouraged. Exercise at least 150 minutes per week and increase as tolerated. Goal BMI > 25. Stress management encouraged. Smoking cessation discussed. Avoid excessive alcohol. Avoid NSAID's. Avoid more than 2000 mg of sodium daily. Medications as prescribed. Follow up as scheduled.   Hyperlipidemia associated with type 2 diabetes mellitus ([place]) Diet encouraged - increase intake of fresh fruits and vegetables, increase intake of lean proteins. Bake, broil, or grill foods. Avoid fried, greasy, and fatty foods. Avoid fast foods. Increase intake of fiber-rich whole grains. Exercise encouraged - at least 150 minutes per week and advance as tolerated.  Goal BMI < 25. Continue medications as prescribed. Follow up in 3-6 months as discussed.   Localized edema Intermittent lower extremity edema. Continue lasix as needed for edema.  -     furosemide (LASIX) 20 MG tablet; Take 1.5 tablets (30 mg total) by mouth daily.  GERD without esophagitis Diet discussed. Avoid fried, spicy, fatty, and acidic foods. Avoid caffeine, nicotine, and alcohol. Do not eat 2-3 hours before bedtime and stay upright for at least 1-2 hours after eating. Eat small frequent meals. Avoid NSAID's like motrin and aleve. Medications as prescribed. Report any new or worsening symptoms. Follow up as discussed or sooner if needed.   -     pantoprazole (PROTONIX) 40 MG tablet; Take 1 tablet (40 mg total) by mouth daily.  Nonrheumatic aortic valve stenosis No chest pain or shortness of breath. Followed by cardiology.   Seasonal allergic rhinitis due to pollen Well controlled with below. Will continue.  -     montelukast (SINGULAIR) 10 MG tablet; Take 1  tablet (10 mg total) by mouth at bedtime.  Need for immunization against influenza -     Flu Vaccine QUAD High Dose(Fluad)     Continue all other maintenance medications.  Follow up plan: Return in about 4 months (around 09/16/2019), or if symptoms worsen or fail to improve, for DM, HTN, Lipids.  Continue healthy lifestyle choices, including diet (rich in fruits, vegetables, and lean proteins, and low in salt and simple carbohydrates) and exercise (at least 30 minutes of moderate physical activity daily).  Educational handout given for DM  The above assessment and management plan was discussed with the patient. The patient verbalized understanding of and has agreed to the management plan. Patient is aware to call the clinic if they develop any new symptoms or if symptoms persist or worsen. Patient is aware when to return to the clinic for a follow-up visit. Patient educated on when it is appropriate to go to the emergency department.   Monia Pouch, FNP-C [facility] [phone]

## 2019-05-21 ENCOUNTER — Ambulatory Visit: Payer: BC Managed Care – PPO | Admitting: Physical Therapy

## 2019-05-22 DIAGNOSIS — E108 Type 1 diabetes mellitus with unspecified complications: Secondary | ICD-10-CM | POA: Diagnosis not present

## 2019-05-22 DIAGNOSIS — Z471 Aftercare following joint replacement surgery: Secondary | ICD-10-CM | POA: Diagnosis not present

## 2019-05-22 DIAGNOSIS — Z96611 Presence of right artificial shoulder joint: Secondary | ICD-10-CM | POA: Diagnosis not present

## 2019-05-22 DIAGNOSIS — G4733 Obstructive sleep apnea (adult) (pediatric): Secondary | ICD-10-CM | POA: Diagnosis not present

## 2019-05-22 DIAGNOSIS — M19212 Secondary osteoarthritis, left shoulder: Secondary | ICD-10-CM | POA: Diagnosis not present

## 2019-05-24 DIAGNOSIS — Z794 Long term (current) use of insulin: Secondary | ICD-10-CM | POA: Diagnosis not present

## 2019-05-24 DIAGNOSIS — E108 Type 1 diabetes mellitus with unspecified complications: Secondary | ICD-10-CM | POA: Diagnosis not present

## 2019-06-04 ENCOUNTER — Encounter: Payer: Self-pay | Admitting: Family Medicine

## 2019-06-28 DIAGNOSIS — G4733 Obstructive sleep apnea (adult) (pediatric): Secondary | ICD-10-CM | POA: Diagnosis not present

## 2019-07-29 DIAGNOSIS — G4733 Obstructive sleep apnea (adult) (pediatric): Secondary | ICD-10-CM | POA: Diagnosis not present

## 2019-08-21 DIAGNOSIS — E108 Type 1 diabetes mellitus with unspecified complications: Secondary | ICD-10-CM | POA: Diagnosis not present

## 2019-08-21 DIAGNOSIS — Z794 Long term (current) use of insulin: Secondary | ICD-10-CM | POA: Diagnosis not present

## 2019-08-28 DIAGNOSIS — G4733 Obstructive sleep apnea (adult) (pediatric): Secondary | ICD-10-CM | POA: Diagnosis not present

## 2019-09-03 ENCOUNTER — Encounter: Payer: Self-pay | Admitting: Family Medicine

## 2019-09-03 ENCOUNTER — Other Ambulatory Visit: Payer: Self-pay | Admitting: Family Medicine

## 2019-09-03 DIAGNOSIS — I700 Atherosclerosis of aorta: Secondary | ICD-10-CM

## 2019-09-03 DIAGNOSIS — E1159 Type 2 diabetes mellitus with other circulatory complications: Secondary | ICD-10-CM

## 2019-09-03 DIAGNOSIS — E1122 Type 2 diabetes mellitus with diabetic chronic kidney disease: Secondary | ICD-10-CM

## 2019-09-03 DIAGNOSIS — I152 Hypertension secondary to endocrine disorders: Secondary | ICD-10-CM

## 2019-09-03 DIAGNOSIS — E559 Vitamin D deficiency, unspecified: Secondary | ICD-10-CM

## 2019-09-03 DIAGNOSIS — E1169 Type 2 diabetes mellitus with other specified complication: Secondary | ICD-10-CM

## 2019-09-03 DIAGNOSIS — I479 Paroxysmal tachycardia, unspecified: Secondary | ICD-10-CM

## 2019-09-03 DIAGNOSIS — E785 Hyperlipidemia, unspecified: Secondary | ICD-10-CM

## 2019-09-03 DIAGNOSIS — D696 Thrombocytopenia, unspecified: Secondary | ICD-10-CM

## 2019-09-10 ENCOUNTER — Other Ambulatory Visit: Payer: Self-pay

## 2019-09-10 ENCOUNTER — Other Ambulatory Visit: Payer: BC Managed Care – PPO

## 2019-09-10 DIAGNOSIS — I700 Atherosclerosis of aorta: Secondary | ICD-10-CM

## 2019-09-10 DIAGNOSIS — N182 Chronic kidney disease, stage 2 (mild): Secondary | ICD-10-CM

## 2019-09-10 DIAGNOSIS — E1159 Type 2 diabetes mellitus with other circulatory complications: Secondary | ICD-10-CM | POA: Diagnosis not present

## 2019-09-10 DIAGNOSIS — I152 Hypertension secondary to endocrine disorders: Secondary | ICD-10-CM

## 2019-09-10 DIAGNOSIS — E1169 Type 2 diabetes mellitus with other specified complication: Secondary | ICD-10-CM

## 2019-09-10 DIAGNOSIS — I10 Essential (primary) hypertension: Secondary | ICD-10-CM | POA: Diagnosis not present

## 2019-09-10 DIAGNOSIS — E559 Vitamin D deficiency, unspecified: Secondary | ICD-10-CM | POA: Diagnosis not present

## 2019-09-10 DIAGNOSIS — E785 Hyperlipidemia, unspecified: Secondary | ICD-10-CM

## 2019-09-10 DIAGNOSIS — E1122 Type 2 diabetes mellitus with diabetic chronic kidney disease: Secondary | ICD-10-CM | POA: Diagnosis not present

## 2019-09-10 DIAGNOSIS — D696 Thrombocytopenia, unspecified: Secondary | ICD-10-CM

## 2019-09-10 DIAGNOSIS — Z794 Long term (current) use of insulin: Secondary | ICD-10-CM | POA: Diagnosis not present

## 2019-09-10 DIAGNOSIS — I479 Paroxysmal tachycardia, unspecified: Secondary | ICD-10-CM

## 2019-09-10 LAB — BAYER DCA HB A1C WAIVED: HB A1C (BAYER DCA - WAIVED): 4.9 % (ref <7.0)

## 2019-09-11 LAB — CBC WITH DIFFERENTIAL/PLATELET
Basophils Absolute: 0.1 10*3/uL (ref 0.0–0.2)
Basos: 1 %
EOS (ABSOLUTE): 0.2 10*3/uL (ref 0.0–0.4)
Eos: 3 %
Hematocrit: 49.5 % (ref 37.5–51.0)
Hemoglobin: 16.3 g/dL (ref 13.0–17.7)
Immature Grans (Abs): 0 10*3/uL (ref 0.0–0.1)
Immature Granulocytes: 0 %
Lymphocytes Absolute: 1.4 10*3/uL (ref 0.7–3.1)
Lymphs: 23 %
MCH: 32.3 pg (ref 26.6–33.0)
MCHC: 32.9 g/dL (ref 31.5–35.7)
MCV: 98 fL — ABNORMAL HIGH (ref 79–97)
Monocytes Absolute: 1 10*3/uL — ABNORMAL HIGH (ref 0.1–0.9)
Monocytes: 17 %
Neutrophils Absolute: 3.4 10*3/uL (ref 1.4–7.0)
Neutrophils: 56 %
Platelets: 135 10*3/uL — ABNORMAL LOW (ref 150–450)
RBC: 5.04 x10E6/uL (ref 4.14–5.80)
RDW: 13.8 % (ref 11.6–15.4)
WBC: 6 10*3/uL (ref 3.4–10.8)

## 2019-09-11 LAB — CMP14+EGFR
ALT: 41 IU/L (ref 0–44)
AST: 37 IU/L (ref 0–40)
Albumin/Globulin Ratio: 1.4 (ref 1.2–2.2)
Albumin: 4.1 g/dL (ref 3.8–4.8)
Alkaline Phosphatase: 72 IU/L (ref 39–117)
BUN/Creatinine Ratio: 11 (ref 10–24)
BUN: 11 mg/dL (ref 8–27)
Bilirubin Total: 0.8 mg/dL (ref 0.0–1.2)
CO2: 23 mmol/L (ref 20–29)
Calcium: 9.5 mg/dL (ref 8.6–10.2)
Chloride: 103 mmol/L (ref 96–106)
Creatinine, Ser: 0.99 mg/dL (ref 0.76–1.27)
GFR calc Af Amer: 89 mL/min/{1.73_m2} (ref >59)
GFR calc non Af Amer: 77 mL/min/{1.73_m2} (ref >59)
Globulin, Total: 3 g/dL (ref 1.5–4.5)
Glucose: 63 mg/dL — ABNORMAL LOW (ref 65–99)
Potassium: 4.9 mmol/L (ref 3.5–5.2)
Sodium: 141 mmol/L (ref 134–144)
Total Protein: 7.1 g/dL (ref 6.0–8.5)

## 2019-09-11 LAB — TSH: TSH: 1.34 u[IU]/mL (ref 0.450–4.500)

## 2019-09-11 LAB — LIPID PANEL
Chol/HDL Ratio: 2 ratio (ref 0.0–5.0)
Cholesterol, Total: 113 mg/dL (ref 100–199)
HDL: 57 mg/dL (ref >39)
LDL Chol Calc (NIH): 44 mg/dL (ref 0–99)
Triglycerides: 48 mg/dL (ref 0–149)
VLDL Cholesterol Cal: 12 mg/dL (ref 5–40)

## 2019-09-11 LAB — VITAMIN D 25 HYDROXY (VIT D DEFICIENCY, FRACTURES): Vit D, 25-Hydroxy: 53.3 ng/mL (ref 30.0–100.0)

## 2019-09-13 ENCOUNTER — Other Ambulatory Visit: Payer: Self-pay

## 2019-09-16 ENCOUNTER — Other Ambulatory Visit: Payer: Self-pay

## 2019-09-16 ENCOUNTER — Ambulatory Visit: Payer: Self-pay | Admitting: Family Medicine

## 2019-09-16 ENCOUNTER — Encounter: Payer: Self-pay | Admitting: Family Medicine

## 2019-09-16 VITALS — BP 111/67 | HR 80 | Temp 98.7°F | Resp 20 | Ht 70.0 in | Wt 245.0 lb

## 2019-09-16 DIAGNOSIS — E1122 Type 2 diabetes mellitus with diabetic chronic kidney disease: Secondary | ICD-10-CM | POA: Diagnosis not present

## 2019-09-16 DIAGNOSIS — I152 Hypertension secondary to endocrine disorders: Secondary | ICD-10-CM

## 2019-09-16 DIAGNOSIS — E1159 Type 2 diabetes mellitus with other circulatory complications: Secondary | ICD-10-CM

## 2019-09-16 DIAGNOSIS — I10 Essential (primary) hypertension: Secondary | ICD-10-CM

## 2019-09-16 DIAGNOSIS — Z23 Encounter for immunization: Secondary | ICD-10-CM

## 2019-09-16 DIAGNOSIS — E785 Hyperlipidemia, unspecified: Secondary | ICD-10-CM

## 2019-09-16 DIAGNOSIS — E1169 Type 2 diabetes mellitus with other specified complication: Secondary | ICD-10-CM

## 2019-09-16 DIAGNOSIS — E559 Vitamin D deficiency, unspecified: Secondary | ICD-10-CM

## 2019-09-16 DIAGNOSIS — Z794 Long term (current) use of insulin: Secondary | ICD-10-CM

## 2019-09-16 DIAGNOSIS — D696 Thrombocytopenia, unspecified: Secondary | ICD-10-CM

## 2019-09-16 DIAGNOSIS — N182 Chronic kidney disease, stage 2 (mild): Secondary | ICD-10-CM

## 2019-09-16 MED ORDER — TOUJEO MAX SOLOSTAR 300 UNIT/ML ~~LOC~~ SOPN: 110.0000 [IU] | PEN_INJECTOR | Freq: Every day | SUBCUTANEOUS | 3 refills | Status: DC

## 2019-09-16 NOTE — Patient Instructions (Addendum)
Hypoglycemia Hypoglycemia is when the sugar (glucose) level in your blood is too low. Signs of low blood sugar may include:  Feeling: ? Hungry. ? Worried or nervous (anxious). ? Sweaty and clammy. ? Confused. ? Dizzy. ? Sleepy. ? Sick to your stomach (nauseous).  Having: ? A fast heartbeat. ? A headache. ? A change in your vision. ? Tingling or no feeling (numbness) around your mouth, lips, or tongue. ? Jerky movements that you cannot control (seizure).  Having trouble with: ? Moving (coordination). ? Sleeping. ? Passing out (fainting). ? Getting upset easily (irritability). Low blood sugar can happen to people who have diabetes and people who do not have diabetes. Low blood sugar can happen quickly, and it can be an emergency. Treating low blood sugar Low blood sugar is often treated by eating or drinking something sugary right away, such as:  Fruit juice, 4-6 oz (120-150 mL).  Regular soda (not diet soda), 4-6 oz (120-150 mL).  Low-fat milk, 4 oz (120 mL).  Several pieces of hard candy.  Sugar or honey, 1 Tbsp (15 mL). Treating low blood sugar if you have diabetes If you can think clearly and swallow safely, follow the 15:15 rule:  Take 15 grams of a fast-acting carb (carbohydrate). Talk with your doctor about how much you should take.  Always keep a source of fast-acting carb with you, such as: ? Sugar tablets (glucose pills). Take 3-4 pills. ? 6-8 pieces of hard candy. ? 4-6 oz (120-150 mL) of fruit juice. ? 4-6 oz (120-150 mL) of regular (not diet) soda. ? 1 Tbsp (15 mL) honey or sugar.  Check your blood sugar 15 minutes after you take the carb.  If your blood sugar is still at or below 70 mg/dL (3.9 mmol/L), take 15 grams of a carb again.  If your blood sugar does not go above 70 mg/dL (3.9 mmol/L) after 3 tries, get help right away.  After your blood sugar goes back to normal, eat a meal or a snack within 1 hour.  Treating very low blood sugar If your  blood sugar is at or below 54 mg/dL (3 mmol/L), you have very low blood sugar (severe hypoglycemia). This may also cause:  Passing out.  Jerky movements you cannot control (seizure).  Losing consciousness (coma). This is an emergency. Do not wait to see if the symptoms will go away. Get medical help right away. Call your local emergency services (911 in the [place] Do not drive yourself to the hospital. If you have very low blood sugar and you cannot eat or drink, you may need a glucagon shot (injection). A family member or friend should learn how to check your blood sugar and how to give you a glucagon shot. Ask your doctor if you need to have a glucagon shot kit at home. Follow these instructions at home: General instructions  Take over-the-counter and prescription medicines only as told by your doctor.  Stay aware of your blood sugar as told by your doctor.  Limit alcohol intake to no more than 1 drink a day for nonpregnant women and 2 drinks a day for men. One drink equals 12 oz of beer (355 mL), 5 oz of wine (148 mL), or 1 oz of hard liquor (44 mL).  Keep all follow-up visits as told by your doctor. This is important. If you have diabetes:   Follow your diabetes care plan as told by your doctor. Make sure you: ? Know the signs of low blood sugar. ?  Take your medicines as told. ? Follow your exercise and meal plan. ? Eat on time. Do not skip meals. ? Check your blood sugar as often as told by your doctor. Always check it before and after exercise. ? Follow your sick day plan when you cannot eat or drink normally. Make this plan ahead of time with your doctor.  Share your diabetes care plan with: ? Your work or school. ? People you live with.  Check your pee (urine) for ketones: ? When you are sick. ? As told by your doctor.  Carry a card or wear jewelry that says you have diabetes. Contact a doctor if:  You have trouble keeping your blood sugar in your target  range.  You have low blood sugar often. Get help right away if:  You still have symptoms after you eat or drink something sugary.  Your blood sugar is at or below 54 mg/dL (3 mmol/L).  You have jerky movements that you cannot control.  You pass out. These symptoms may be an emergency. Do not wait to see if the symptoms will go away. Get medical help right away. Call your local emergency services (911 in the [place] Do not drive yourself to the hospital. Summary  Hypoglycemia happens when the level of sugar (glucose) in your blood is too low.  Low blood sugar can happen to people who have diabetes and people who do not have diabetes. Low blood sugar can happen quickly, and it can be an emergency.  Make sure you know the signs of low blood sugar and know how to treat it.  Always keep a source of sugar (fast-acting carb) with you to treat low blood sugar. This information is not intended to replace advice given to you by your health care provider. Make sure you discuss any questions you have with your health care provider. Document Revised: 12/06/2018 Document Reviewed: 09/18/2015 Elsevier Patient Education  2020 Elsevier Inc.  

## 2019-09-16 NOTE — Progress Notes (Signed)
Subjective:  Patient ID: Tony Cantu, male    DOB: 1951-06-14, 69 y.o.   MRN: [id number]  Patient Care Team: Baruch Gouty, FNP as PCP - General (Family Medicine) Minus Breeding, MD as PCP - Cardiology (Cardiology) Larey Dresser, MD (Cardiology) Clance, Armando Reichert, MD (Pulmonary Disease) Teena Irani, MD (Inactive) (Gastroenterology)   Chief Complaint:  Medical Management of Chronic Issues (4 mo ), Diabetes, Hyperlipidemia, and Hypertension   HPI: Tony Cantu is a 69 y.o. male presenting on 09/16/2019 for Medical Management of Chronic Issues (4 mo ), Diabetes, Hyperlipidemia, and Hypertension   1. Controlled type 2 diabetes mellitus with stage 2 chronic kidney disease, with long-term current use of insulin (HCC) Pt compliant with medications. Pt has cut back SS dose and Toujeo dose to 110 units per day. Denies hypoglycemic episodes. No polyuria, polydipsia, or polyphagia.   2. Hypertension associated with type 2 diabetes mellitus ([place]) Compliant with medications. No adverse side effects. No chest pain, shortness of breath, or headaches. No confusion or weakness. Does have slight bilateral lower extremity swelling.   3. Hyperlipidemia associated with type 2 diabetes mellitus ([place]) Does not watch diet or exercise on a regular basis. Does take medications as prescribed without increased myalgias or arthralgias.   4. Thrombocytopenia (HCC) No abnormal bleeding or bruising. No petechial rash.   5. Vitamin D deficiency Pt is taking oral repletion therapy. Denies bone pain and tenderness, muscle weakness, fracture, and difficulty walking. Lab Results  Component Value Date   VD25OH 53.3 09/10/2019   VD25OH 68.3 01/15/2019   VD25OH 57.4 09/11/2018   Lab Results  Component Value Date   CALCIUM 9.5 09/10/2019       Relevant past medical, surgical, family, and social history reviewed and updated as indicated.  Allergies and medications reviewed and updated. Date reviewed:  Chart in Epic.   Past Medical History:  Diagnosis Date  . Arthritis   . Asthma   . BPH (benign prostatic hypertrophy)   . Colon polyps   . Diabetes mellitus without complication (HCC)    TYPE 1   . Gastritis   . GERD (gastroesophageal reflux disease)   . Headache    HX  . Hypertension   . PONV (postoperative nausea and vomiting)   . Seizures (HCC)    HYPOGLYCEMIC LAST 1 AND 1/2 YRS AGO  . Sleep apnea    uses CPAP nightly  . Testicle trouble    one testicle BORN WITH    Past Surgical History:  Procedure Laterality Date  . BACK SURGERY     94  LOWER   . CARPAL TUNNEL RELEASE Right 10/13/2015   Procedure: RIGHT CARPAL TUNNEL RELEASE;  Surgeon: Daryll Brod, MD;  Location: [facility];  Service: Orthopedics;  Laterality: Right;  . CARPAL TUNNEL RELEASE Left 07/05/2016   Procedure: LEFT CARPAL TUNNEL RELEASE;  Surgeon: Daryll Brod, MD;  Location: [facility];  Service: Orthopedics;  Laterality: Left;  . HERNIA REPAIR    . INGUINAL HERNIA REPAIR  2003   right   . KNEE ARTHROSCOPY Right 03/03/2016  . LUMBAR DISC SURGERY  8/96   Dr. Coralyn Mark, discectomy  . LUMBAR LAMINECTOMY/DECOMPRESSION MICRODISCECTOMY Right 10/06/2016   Procedure: RIGHT LUMBAR THREE - LUMBAR FOUR  LAMINECTOMY, FORAMINOTOMY AND MICRODISCECTOMY;  Surgeon: Jovita Gamma, MD;  Location: [facility];  Service: Neurosurgery;  Laterality: Right;  . SHOULDER SURGERY  11/28/05   left partial  . SHOULDER SURGERY  07/14/2006  RIGHT  . TONSILLECTOMY  AGE 18 OR 4  . TOTAL SHOULDER ARTHROPLASTY Right 11/01/2018   Procedure: RIGHT SHOULDER REVISION TO REVERSE TOTAL SHOULDER;  Surgeon: Tania Ade, MD;  Location: [facility];  Service: Orthopedics;  Laterality: Right;  CHOICE ANESTHESIA WITH INTERSCALENE BLOCK EXPAREL, NEEDS RNFA    Social History   Socioeconomic History  . Marital status: Married    Spouse name: Not on file  . Number of children: Not on file  . Years of education: Not on file  .  Highest education level: Not on file  Occupational History  . Occupation: retired   Tobacco Use  . Smoking status: Former Smoker    Years: 34.00    Types: Cigarettes, Pipe  . Smokeless tobacco: Never Used  . Tobacco comment: quit 2005 smoked cigarettes for 5 yrs prior to pipe use  Substance and Sexual Activity  . Alcohol use: No    Comment: once a year  . Drug use: No  . Sexual activity: Yes  Other Topics Concern  . Not on file  Social History Narrative  . Not on file   Social Determinants of Health   Financial Resource Strain:   . Difficulty of Paying Living Expenses: Not on file  Food Insecurity:   . Worried About Charity fundraiser in the Last Year: Not on file  . Ran Out of Food in the Last Year: Not on file  Transportation Needs:   . Lack of Transportation (Medical): Not on file  . Lack of Transportation (Non-Medical): Not on file  Physical Activity:   . Days of Exercise per Week: Not on file  . Minutes of Exercise per Session: Not on file  Stress:   . Feeling of Stress : Not on file  Social Connections:   . Frequency of Communication with Friends and Family: Not on file  . Frequency of Social Gatherings with Friends and Family: Not on file  . Attends Religious Services: Not on file  . Active Member of Clubs or Organizations: Not on file  . Attends Archivist Meetings: Not on file  . Marital Status: Not on file  Intimate Partner Violence:   . Fear of Current or Ex-Partner: Not on file  . Emotionally Abused: Not on file  . Physically Abused: Not on file  . Sexually Abused: Not on file    Outpatient Encounter Medications as of 09/16/2019  Medication Sig  . albuterol (PROVENTIL HFA;VENTOLIN HFA) 108 (90 Base) MCG/ACT inhaler Inhale 2 puffs into the lungs every 6 (six) hours as needed for wheezing or shortness of breath.  Marland Kitchen aspirin 81 MG chewable tablet Chew by mouth daily.  Marland Kitchen atorvastatin (LIPITOR) 40 MG tablet TAKE 1 TABLET BY MOUTH  DAILY  . b  complex vitamins tablet Take 1 tablet by mouth daily at 12 noon.   . Calcium Carbonate-Vitamin D 600-400 MG-UNIT tablet Take 1 tablet by mouth daily.  . Cholecalciferol (VITAMIN D3) 2000 UNITS capsule Take 2,000 Units by mouth daily at 12 noon.   . Continuous Blood Gluc Receiver (FREESTYLE LIBRE READER) DEVI 1 applicator by Does not apply route as directed.  . Continuous Blood Gluc Sensor (FREESTYLE LIBRE SENSOR SYSTEM) MISC Check BS eight (8) times a day. Dx E10.9  . fexofenadine (ALLEGRA) 180 MG tablet Take 1 tablet (180 mg total) by mouth daily with breakfast.  . furosemide (LASIX) 20 MG tablet Take 1.5 tablets (30 mg total) by mouth daily.  Marland Kitchen GLUCAGON EMERGENCY 1 MG injection INJECT  1MG INTO SKIN ONCE  AS NEEDED  . Glucosamine-Chondroit-Vit C-Mn (GLUCOSAMINE 1500 COMPLEX) CAPS Take 2 capsules by mouth daily at 12 noon.   . Insulin Glargine, 2 Unit Dial, (TOUJEO MAX SOLOSTAR) 300 UNIT/ML SOPN Inject 110 Units into the skin daily.  . insulin lispro (HUMALOG) 100 UNIT/ML injection INJECT 20 TO 50 UNITS  SUBCUTANEOUSLY 3 TIMES  DAILY BEFORE MEALS PER SLIDING SCALE  . montelukast (SINGULAIR) 10 MG tablet Take 1 tablet (10 mg total) by mouth at bedtime.  . multivitamin-lutein (OCUVITE-LUTEIN) CAPS capsule Take 1 capsule by mouth daily at 12 noon.  . pantoprazole (PROTONIX) 40 MG tablet Take 1 tablet (40 mg total) by mouth daily.  . sildenafil (REVATIO) 20 MG tablet TAKE 2-5 TABLETS AS NEEDED PRIOR TO SEXUAL ACTIVITY  . Testosterone 30 MG/ACT SOLN USE 1 PUMP UNDER EACH  AXILLA DAILY (TOTAL OF 60MG DAILY)  . valsartan (DIOVAN) 160 MG tablet TAKE 1 TABLET BY MOUTH  DAILY  . [DISCONTINUED] Insulin Glargine, 2 Unit Dial, (TOUJEO MAX SOLOSTAR) 300 UNIT/ML SOPN Inject 120 Units into the skin daily.  . [DISCONTINUED] Flaxseed, Linseed, (FLAX SEED OIL PO) Take 1,300 mg by mouth daily with breakfast.    No facility-administered encounter medications on file as of 09/16/2019.    Allergies  Allergen  Reactions  . Erythromycin Diarrhea  . Sulfonamide Derivatives Diarrhea    Review of Systems  Constitutional: Negative for activity change, appetite change, chills, diaphoresis, fatigue, fever and unexpected weight change.  HENT: Negative.   Eyes: Negative.  Negative for visual disturbance.  Respiratory: Negative for cough, chest tightness and shortness of breath.   Cardiovascular: Negative for chest pain, palpitations and leg swelling.  Gastrointestinal: Negative for abdominal pain, blood in stool, constipation, diarrhea, nausea and vomiting.  Endocrine: Negative.  Negative for polydipsia, polyphagia and polyuria.  Genitourinary: Negative for decreased urine volume, difficulty urinating, dysuria, frequency and urgency.  Musculoskeletal: Positive for arthralgias and myalgias.  Skin: Negative.   Allergic/Immunologic: Negative.   Neurological: Negative for dizziness, tremors, seizures, syncope, facial asymmetry, speech difficulty, weakness, light-headedness, numbness and headaches.  Hematological: Negative.   Psychiatric/Behavioral: Negative for confusion, hallucinations, sleep disturbance and suicidal ideas.  All other systems reviewed and are negative.       Objective:  BP 111/67   Pulse 80   Temp 98.7 F (37.1 C)   Resp 20   Ht 5' 10" (1.778 m)   Wt 245 lb (111.1 kg)   SpO2 95%   BMI 35.15 kg/m    Wt Readings from Last 3 Encounters:  09/16/19 245 lb (111.1 kg)  05/17/19 242 lb (109.8 kg)  04/24/19 249 lb (112.9 kg)    Physical Exam Vitals and nursing note reviewed.  Constitutional:      General: He is not in acute distress.    Appearance: Normal appearance. He is well-developed and well-groomed. He is not ill-appearing, toxic-appearing or diaphoretic.  HENT:     Head: Normocephalic and atraumatic.     Jaw: There is normal jaw occlusion.     Right Ear: Hearing normal.     Left Ear: Hearing normal.     Nose: Nose normal.     Mouth/Throat:     Lips: Pink.      Mouth: Mucous membranes are moist.     Pharynx: Oropharynx is clear. Uvula midline.  Eyes:     General: Lids are normal.     Extraocular Movements: Extraocular movements intact.     Conjunctiva/sclera: Conjunctivae normal.  Pupils: Pupils are equal, round, and reactive to light.  Neck:     Thyroid: No thyroid mass, thyromegaly or thyroid tenderness.     Vascular: Carotid bruit (bilateral) present. No JVD.     Trachea: Trachea and phonation normal.  Cardiovascular:     Rate and Rhythm: Normal rate and regular rhythm.     Chest Avedisian: PMI is not displaced.     Pulses: Normal pulses.     Heart sounds: Murmur present. Systolic murmur present with a grade of 2/6. No friction rub. No gallop.   Pulmonary:     Effort: Pulmonary effort is normal. No respiratory distress.     Breath sounds: Normal breath sounds. No wheezing.  Abdominal:     General: Bowel sounds are normal. There is no distension or abdominal bruit.     Palpations: Abdomen is soft. There is no hepatomegaly or splenomegaly.     Tenderness: There is no abdominal tenderness. There is no right CVA tenderness or left CVA tenderness.     Hernia: No hernia is present.  Musculoskeletal:        General: Normal range of motion.     Cervical back: Normal range of motion and neck supple.     Right lower leg: 1+ Edema present.     Left lower leg: 1+ Edema present.     Comments: Bilateral shoulder pain with decreased ROM, chronic.  Lymphadenopathy:     Cervical: No cervical adenopathy.  Skin:    General: Skin is warm and dry.     Capillary Refill: Capillary refill takes less than 2 seconds.     Coloration: Skin is not cyanotic, jaundiced or pale.     Findings: No rash.  Neurological:     General: No focal deficit present.     Mental Status: He is alert and oriented to person, place, and time.     Cranial Nerves: Cranial nerves are intact. No cranial nerve deficit.     Sensory: Sensation is intact. No sensory deficit.     Motor:  Motor function is intact. No weakness.     Coordination: Coordination is intact. Coordination normal.     Gait: Gait is intact. Gait normal.     Deep Tendon Reflexes: Reflexes are normal and symmetric. Reflexes normal.  Psychiatric:        Attention and Perception: Attention and perception normal.        Mood and Affect: Mood and affect normal.        Speech: Speech normal.        Behavior: Behavior normal. Behavior is cooperative.        Thought Content: Thought content normal.        Cognition and Memory: Cognition and memory normal.        Judgment: Judgment normal.     Results for orders placed or performed in visit on 09/10/19  Vitamin D 25 hydroxy  Result Value Ref Range   Vit D, 25-Hydroxy 53.3 30.0 - 100.0 ng/mL  TSH  Result Value Ref Range   TSH 1.340 0.450 - 4.500 uIU/mL  CMP14+EGFR  Result Value Ref Range   Glucose 63 (L) 65 - 99 mg/dL   BUN 11 8 - 27 mg/dL   Creatinine, Ser 0.99 0.76 - 1.27 mg/dL   GFR calc non Af Amer 77 >59 mL/min/1.73   GFR calc Af Amer 89 >59 mL/min/1.73   BUN/Creatinine Ratio 11 10 - 24   Sodium 141 134 - 144 mmol/L  Potassium 4.9 3.5 - 5.2 mmol/L   Chloride 103 96 - 106 mmol/L   CO2 23 20 - 29 mmol/L   Calcium 9.5 8.6 - 10.2 mg/dL   Total Protein 7.1 6.0 - 8.5 g/dL   Albumin 4.1 3.8 - 4.8 g/dL   Globulin, Total 3.0 1.5 - 4.5 g/dL   Albumin/Globulin Ratio 1.4 1.2 - 2.2   Bilirubin Total 0.8 0.0 - 1.2 mg/dL   Alkaline Phosphatase 72 39 - 117 IU/L   AST 37 0 - 40 IU/L   ALT 41 0 - 44 IU/L  Lipid panel  Result Value Ref Range   Cholesterol, Total 113 100 - 199 mg/dL   Triglycerides 48 0 - 149 mg/dL   HDL 57 >39 mg/dL   VLDL Cholesterol Cal 12 5 - 40 mg/dL   LDL Chol Calc (NIH) 44 0 - 99 mg/dL   Chol/HDL Ratio 2.0 0.0 - 5.0 ratio  CBC with Differential/Platelet  Result Value Ref Range   WBC 6.0 3.4 - 10.8 x10E3/uL   RBC 5.04 4.14 - 5.80 x10E6/uL   Hemoglobin 16.3 13.0 - 17.7 g/dL   Hematocrit 49.5 37.5 - 51.0 %   MCV 98 (H) 79 -  97 fL   MCH 32.3 26.6 - 33.0 pg   MCHC 32.9 31.5 - 35.7 g/dL   RDW 13.8 11.6 - 15.4 %   Platelets 135 (L) 150 - 450 x10E3/uL   Neutrophils 56 Not Estab. %   Lymphs 23 Not Estab. %   Monocytes 17 Not Estab. %   Eos 3 Not Estab. %   Basos 1 Not Estab. %   Neutrophils Absolute 3.4 1.4 - 7.0 x10E3/uL   Lymphocytes Absolute 1.4 0.7 - 3.1 x10E3/uL   Monocytes Absolute 1.0 (H) 0.1 - 0.9 x10E3/uL   EOS (ABSOLUTE) 0.2 0.0 - 0.4 x10E3/uL   Basophils Absolute 0.1 0.0 - 0.2 x10E3/uL   Immature Granulocytes 0 Not Estab. %   Immature Grans (Abs) 0.0 0.0 - 0.1 x10E3/uL  [id number]  Result Value Ref Range   HB A1C (BAYER DCA - WAIVED) 4.9 <7.0 %       Pertinent labs & imaging results that were available during my care of the patient were reviewed by me and considered in my medical decision making.  Assessment & Plan:  Tony Cantu was seen today for medical management of chronic issues, diabetes, hyperlipidemia and hypertension.  Diagnoses and all orders for this visit:  Controlled type 2 diabetes mellitus with stage 2 chronic kidney disease, with long-term current use of insulin (HCC) A1C 4.9. Has decreased SS dosage and Toujeo to 110 units daily. No hypoglycemia episodes. Pt aware to report any persistent low readings.  -     Insulin Glargine, 2 Unit Dial, (TOUJEO MAX SOLOSTAR) 300 UNIT/ML SOPN; Inject 110 Units into the skin daily.  Hypertension associated with type 2 diabetes mellitus (HCC) BP well controlled. Changes were not made in regimen today. Goal BP is 130/80. Pt aware to report any persistent high or low readings. DASH diet and exercise encouraged. Exercise at least 150 minutes per week and increase as tolerated. Goal BMI > 25. Stress management encouraged. Avoid nicotine and tobacco product use. Avoid excessive alcohol and NSAID's. Avoid more than 2000 mg of sodium daily. Medications as prescribed. Follow up as scheduled.   Hyperlipidemia associated with type 2 diabetes mellitus ([place]) Diet  encouraged - increase intake of fresh fruits and vegetables, increase intake of lean proteins. Bake, broil, or grill foods. Avoid fried, greasy,  and fatty foods. Avoid fast foods. Increase intake of fiber-rich whole grains. Exercise encouraged - at least 150 minutes per week and advance as tolerated.  Goal BMI < 25. Continue medications as prescribed. Follow up in 3-6 months as discussed.   Thrombocytopenia (HCC) No abnormal bleeding and bruising. Last platelet count normal.   Vitamin D deficiency Labs pending. Continue repletion therapy. If indicated, will change repletion dosage. Eat foods rich in Vit D including milk, orange juice, yogurt with vitamin D added, salmon or mackerel, canned tuna fish, cereals with vitamin D added, and cod liver oil. Get out in the sun but make sure to wear at least SPF 30 sunscreen.   Need for pneumococcal vaccination -     Pneumococcal polysaccharide vaccine 23-valent greater than or equal to 2yo subcutaneous/IM     Continue all other maintenance medications.  Follow up plan: Return in about 3 months (around 12/15/2019), or if symptoms worsen or fail to improve, for DM.  Continue healthy lifestyle choices, including diet (rich in fruits, vegetables, and lean proteins, and low in salt and simple carbohydrates) and exercise (at least 30 minutes of moderate physical activity daily).  Educational handout given for hypoglycemia  The above assessment and management plan was discussed with the patient. The patient verbalized understanding of and has agreed to the management plan. Patient is aware to call the clinic if they develop any new symptoms or if symptoms persist or worsen. Patient is aware when to return to the clinic for a follow-up visit. Patient educated on when it is appropriate to go to the emergency department.   Monia Pouch, FNP-C [facility] [phone]

## 2019-09-25 ENCOUNTER — Other Ambulatory Visit: Payer: Self-pay | Admitting: Family Medicine

## 2019-09-25 DIAGNOSIS — J301 Allergic rhinitis due to pollen: Secondary | ICD-10-CM

## 2019-09-25 DIAGNOSIS — R600 Localized edema: Secondary | ICD-10-CM

## 2019-09-26 ENCOUNTER — Other Ambulatory Visit: Payer: Self-pay | Admitting: *Deleted

## 2019-09-26 MED ORDER — TESTOSTERONE 30 MG/ACT TD SOLN: TRANSDERMAL | 0 refills | Status: DC

## 2019-09-26 NOTE — Telephone Encounter (Signed)
Refill request testosterone sol pump Last seen with you 09/16/19 you haven't rx'd it before Last rx'd by [facility] 01/25/19 Ok to refill?

## 2019-09-26 NOTE — Telephone Encounter (Signed)
Will need a visit to have testosterone level checked prior to next refill.

## 2019-09-26 NOTE — Telephone Encounter (Signed)
Pt aware and states he has appt in May and still has some in his current bottle so he should be fine till appt.

## 2019-11-07 ENCOUNTER — Encounter: Payer: Self-pay | Admitting: Family Medicine

## 2019-11-07 DIAGNOSIS — Z23 Encounter for immunization: Secondary | ICD-10-CM | POA: Diagnosis not present

## 2019-11-21 DIAGNOSIS — E108 Type 1 diabetes mellitus with unspecified complications: Secondary | ICD-10-CM | POA: Diagnosis not present

## 2019-11-21 DIAGNOSIS — Z794 Long term (current) use of insulin: Secondary | ICD-10-CM | POA: Diagnosis not present

## 2019-11-26 DIAGNOSIS — G4733 Obstructive sleep apnea (adult) (pediatric): Secondary | ICD-10-CM | POA: Diagnosis not present

## 2019-12-03 DIAGNOSIS — E108 Type 1 diabetes mellitus with unspecified complications: Secondary | ICD-10-CM | POA: Diagnosis not present

## 2019-12-05 ENCOUNTER — Encounter: Payer: Self-pay | Admitting: Family Medicine

## 2019-12-05 DIAGNOSIS — Z23 Encounter for immunization: Secondary | ICD-10-CM | POA: Diagnosis not present

## 2019-12-11 ENCOUNTER — Encounter: Payer: Self-pay | Admitting: Family Medicine

## 2019-12-11 ENCOUNTER — Other Ambulatory Visit: Payer: Self-pay | Admitting: *Deleted

## 2019-12-11 DIAGNOSIS — E1169 Type 2 diabetes mellitus with other specified complication: Secondary | ICD-10-CM

## 2019-12-11 DIAGNOSIS — N400 Enlarged prostate without lower urinary tract symptoms: Secondary | ICD-10-CM

## 2019-12-11 DIAGNOSIS — D696 Thrombocytopenia, unspecified: Secondary | ICD-10-CM

## 2019-12-11 DIAGNOSIS — E1159 Type 2 diabetes mellitus with other circulatory complications: Secondary | ICD-10-CM

## 2019-12-11 DIAGNOSIS — I152 Hypertension secondary to endocrine disorders: Secondary | ICD-10-CM

## 2019-12-11 DIAGNOSIS — E1122 Type 2 diabetes mellitus with diabetic chronic kidney disease: Secondary | ICD-10-CM

## 2019-12-11 DIAGNOSIS — E785 Hyperlipidemia, unspecified: Secondary | ICD-10-CM

## 2019-12-11 DIAGNOSIS — E349 Endocrine disorder, unspecified: Secondary | ICD-10-CM

## 2019-12-12 ENCOUNTER — Other Ambulatory Visit: Payer: Self-pay | Admitting: Family Medicine

## 2019-12-17 DIAGNOSIS — G4733 Obstructive sleep apnea (adult) (pediatric): Secondary | ICD-10-CM | POA: Diagnosis not present

## 2019-12-22 DIAGNOSIS — Z794 Long term (current) use of insulin: Secondary | ICD-10-CM | POA: Diagnosis not present

## 2019-12-22 DIAGNOSIS — E108 Type 1 diabetes mellitus with unspecified complications: Secondary | ICD-10-CM | POA: Diagnosis not present

## 2019-12-24 ENCOUNTER — Other Ambulatory Visit: Payer: Self-pay | Admitting: *Deleted

## 2019-12-24 ENCOUNTER — Other Ambulatory Visit: Payer: Self-pay

## 2019-12-24 ENCOUNTER — Other Ambulatory Visit: Payer: BC Managed Care – PPO

## 2019-12-24 DIAGNOSIS — E1122 Type 2 diabetes mellitus with diabetic chronic kidney disease: Secondary | ICD-10-CM

## 2019-12-24 DIAGNOSIS — E1159 Type 2 diabetes mellitus with other circulatory complications: Secondary | ICD-10-CM | POA: Diagnosis not present

## 2019-12-24 DIAGNOSIS — Z794 Long term (current) use of insulin: Secondary | ICD-10-CM | POA: Diagnosis not present

## 2019-12-24 DIAGNOSIS — N182 Chronic kidney disease, stage 2 (mild): Secondary | ICD-10-CM

## 2019-12-24 DIAGNOSIS — N400 Enlarged prostate without lower urinary tract symptoms: Secondary | ICD-10-CM | POA: Diagnosis not present

## 2019-12-24 LAB — BAYER DCA HB A1C WAIVED: HB A1C (BAYER DCA - WAIVED): 5 % (ref <7.0)

## 2019-12-27 DIAGNOSIS — G4733 Obstructive sleep apnea (adult) (pediatric): Secondary | ICD-10-CM | POA: Diagnosis not present

## 2019-12-27 LAB — TESTOSTERONE,FREE AND TOTAL
Testosterone, Free: 6.2 pg/mL — ABNORMAL LOW (ref 6.6–18.1)
Testosterone: 568 ng/dL (ref 264–916)

## 2019-12-27 LAB — CBC WITH DIFFERENTIAL/PLATELET
Basophils Absolute: 0.1 10*3/uL (ref 0.0–0.2)
Basos: 1 %
EOS (ABSOLUTE): 0.2 10*3/uL (ref 0.0–0.4)
Eos: 3 %
Hematocrit: 48.8 % (ref 37.5–51.0)
Hemoglobin: 16.1 g/dL (ref 13.0–17.7)
Immature Grans (Abs): 0 10*3/uL (ref 0.0–0.1)
Immature Granulocytes: 1 %
Lymphocytes Absolute: 1.5 10*3/uL (ref 0.7–3.1)
Lymphs: 23 %
MCH: 32.9 pg (ref 26.6–33.0)
MCHC: 33 g/dL (ref 31.5–35.7)
MCV: 100 fL — ABNORMAL HIGH (ref 79–97)
Monocytes Absolute: 1 10*3/uL — ABNORMAL HIGH (ref 0.1–0.9)
Monocytes: 16 %
Neutrophils Absolute: 3.7 10*3/uL (ref 1.4–7.0)
Neutrophils: 56 %
Platelets: 127 10*3/uL — ABNORMAL LOW (ref 150–450)
RBC: 4.9 x10E6/uL (ref 4.14–5.80)
RDW: 14.3 % (ref 11.6–15.4)
WBC: 6.5 10*3/uL (ref 3.4–10.8)

## 2019-12-27 LAB — PSA, TOTAL AND FREE
PSA, Free Pct: 33.1 %
PSA, Free: 0.43 ng/mL
Prostate Specific Ag, Serum: 1.3 ng/mL (ref 0.0–4.0)

## 2019-12-27 LAB — CMP14+EGFR
ALT: 47 IU/L — ABNORMAL HIGH (ref 0–44)
AST: 44 IU/L — ABNORMAL HIGH (ref 0–40)
Albumin/Globulin Ratio: 1.3 (ref 1.2–2.2)
Albumin: 4 g/dL (ref 3.8–4.8)
Alkaline Phosphatase: 67 IU/L (ref 39–117)
BUN/Creatinine Ratio: 10 (ref 10–24)
BUN: 10 mg/dL (ref 8–27)
Bilirubin Total: 0.8 mg/dL (ref 0.0–1.2)
CO2: 23 mmol/L (ref 20–29)
Calcium: 9.5 mg/dL (ref 8.6–10.2)
Chloride: 103 mmol/L (ref 96–106)
Creatinine, Ser: 0.99 mg/dL (ref 0.76–1.27)
GFR calc Af Amer: 89 mL/min/{1.73_m2} (ref >59)
GFR calc non Af Amer: 77 mL/min/{1.73_m2} (ref >59)
Globulin, Total: 3.1 g/dL (ref 1.5–4.5)
Glucose: 61 mg/dL — ABNORMAL LOW (ref 65–99)
Potassium: 4.4 mmol/L (ref 3.5–5.2)
Sodium: 142 mmol/L (ref 134–144)
Total Protein: 7.1 g/dL (ref 6.0–8.5)

## 2020-01-14 ENCOUNTER — Encounter: Payer: Self-pay | Admitting: Primary Care

## 2020-01-14 ENCOUNTER — Other Ambulatory Visit: Payer: Self-pay

## 2020-01-14 ENCOUNTER — Ambulatory Visit: Payer: BC Managed Care – PPO | Admitting: Primary Care

## 2020-01-14 DIAGNOSIS — Z9189 Other specified personal risk factors, not elsewhere classified: Secondary | ICD-10-CM | POA: Diagnosis not present

## 2020-01-14 DIAGNOSIS — G4733 Obstructive sleep apnea (adult) (pediatric): Secondary | ICD-10-CM | POA: Diagnosis not present

## 2020-01-14 NOTE — Patient Instructions (Addendum)
Sleep apnea: Continue CPAP every night 4-6 hours or more Do not drive if experiencing excessive daytime fatigue or somnolence Please bring in SD card for download for compliance check   Asthma: Continue Albuterol as needed 2 puffs every 6 hours for shortness of breath or wheezing  Chest pain: Discuss chest pain with PCP and cardiologist (discuss starting CoQ10 with them as well)  Arthritis: CoQ10 supplement can help decrease the pain of arthritis by decreasing the inflammatory chemicals that lead to cartilage breakdown   Follow-up: 1 year with PFTs Dr. Halford Chessman or APP

## 2020-01-14 NOTE — Assessment & Plan Note (Signed)
-   Reports occasional mid-sternal chest pain that does not last more than a few seconds. - No active chest pain today in office. Blood pressure stable.  - Patient has risk factors for CAD including obesity, diabetes, hx tobacco use - May consider cardiac testing such as exercise stress test or cardiac CT  - Follows with Cardiology, has an appointment with PCP tomorrow

## 2020-01-14 NOTE — Assessment & Plan Note (Addendum)
-   Stable interval, no recent exacerbations. He has a chronic cough. Very rare SABA use - PFTs in 2017 showed mild restriction, mild diffusion defect  - LDCT in June 2021 showed clear lungs, LUNG RADS 1s - Continue Albuterol as needed 2 puffs every 6 hours for shortness of breath or wheezing - Recommend repeat PFTs in 1 year

## 2020-01-14 NOTE — Assessment & Plan Note (Addendum)
-   Patient reports compliant with CPAP. He has no issues with current pressure setting or mask. Benefiting from use.  - Continue CPAP every night 4-6 hours or more each night  - Advised not drive if experiencing excessive daytime fatigue or somnolence - Please bring in SD card for download for compliance check  - DME company does no have airview available  - [facility] 3/24 - FU in 1 year with Dr. Halford Chessman

## 2020-01-14 NOTE — Progress Notes (Signed)
@Patient  ID: Tony Cantu, male    DOB: 02-19-1951, 69 y.o.   MRN: [id number]  Chief Complaint  Patient presents with  . Follow-up    no SOB/ whezzing chronic cough    Referring provider: Baruch Gouty, FNP  HPI: 69 year old male, former smoker. Past medical history significant for obstructive sleep apnea on CPAP, asthma, hypertension, GERD, diabetes type 2, obesity. Patient of Dr. Halford Chessman, last seen by pulmonary nurse practitioner in May 2020. Spirometry showed mild restriction. NPSG in 2013 showed AHI 76/hr. Maintained on CPAP auto titrate 5 to 20 cm H2O. LDCT 02/22/19 showed lung RADS 1S.    01/14/2020 Patient presents today for annual follow-up for OSA and asthma. He reports compliance with CPAP every night. His DME company is [employer] in [place]. No issues with pressure setting. He uses full mask mask. Sleeping well at night, very seldomly does he wake up.  He forgot to bring SD card today unfortunately.  He gets winded with some activities, this does not interrupt his daily life.  He has a chronic cough. He uses his Albuterol rescue inhaler once every 6-7 weeks for bronchial congestion. Continues to take Singulair 55m at bedtime. Reports some arthritis issues. He takes fish oil every day. He experiences occasional mid-sternal chest pain that last a few short seconds. He will be seeing his PCP tomororw and cardiologist in August. No active chest pain today in office.   Imaging: 02/21/19 LDCT- Lungs/Pleura: No suspicious appearing pulmonary nodules or masses are noted. No acute consolidative airspace disease. No pleural Effusions. Lung-RADS 1S, negative. Continue annual screening with low-dose chest CT without contrast in 12 months.  Allergies  Allergen Reactions  . Erythromycin Diarrhea  . Sulfonamide Derivatives Diarrhea    Immunization History  Administered Date(s) Administered  . Fluad Quad(high Dose 65+) 05/17/2019  . Influenza Split 06/05/2013  . Influenza Whole  06/15/2010, 06/16/2011, 05/29/2012  . Influenza, High Dose Seasonal PF 06/25/2015, 06/12/2017  . Influenza,inj,Quad PF,6+ Mos 06/17/2014, 06/07/2016  . Influenza-Unspecified 05/22/2018  . Moderna SARS-COVID-2 Vaccination 10/24/2019, 11/07/2019  . Pneumococcal Conjugate-13 06/05/2013  . Pneumococcal Polysaccharide-23 06/03/2011, 09/16/2019  . Tdap 08/30/2007, 12/27/2017    Past Medical History:  Diagnosis Date  . Arthritis   . Asthma   . BPH (benign prostatic hypertrophy)   . Colon polyps   . Diabetes mellitus without complication (HCC)    TYPE 1   . Gastritis   . GERD (gastroesophageal reflux disease)   . Headache    HX  . Hypertension   . PONV (postoperative nausea and vomiting)   . Seizures (HCC)    HYPOGLYCEMIC LAST 1 AND 1/2 YRS AGO  . Sleep apnea    uses CPAP nightly  . Testicle trouble    one testicle BORN WITH    Tobacco History: Social History   Tobacco Use  Smoking Status Former Smoker  . Years: 34.00  . Types: Cigarettes, Pipe  Smokeless Tobacco Never Used  Tobacco Comment   quit 2005 smoked cigarettes for 5 yrs prior to pipe use   Counseling given: Not Answered Comment: quit 2005 smoked cigarettes for 5 yrs prior to pipe use   Outpatient Medications Prior to Visit  Medication Sig Dispense Refill  . aspirin 81 MG chewable tablet Chew by mouth daily.    .Marland Kitchenatorvastatin (LIPITOR) 40 MG tablet TAKE 1 TABLET BY MOUTH  DAILY 90 tablet 3  . b complex vitamins tablet Take 1 tablet by mouth daily at 12 noon.     .Marland Kitchen  Calcium Carbonate-Vitamin D 600-400 MG-UNIT tablet Take 1 tablet by mouth daily.    . Cholecalciferol (VITAMIN D3) 2000 UNITS capsule Take 2,000 Units by mouth daily at 12 noon.     . Continuous Blood Gluc Receiver (FREESTYLE LIBRE READER) DEVI 1 applicator by Does not apply route as directed. 1 Device 1  . Continuous Blood Gluc Sensor (FREESTYLE LIBRE SENSOR SYSTEM) MISC Check BS eight (8) times a day. Dx E10.9 3 each 3  . fexofenadine (ALLEGRA) 180  MG tablet Take 1 tablet (180 mg total) by mouth daily with breakfast. 90 tablet 3  . furosemide (LASIX) 20 MG tablet TAKE 1 AND 1/2 TABLETS BY  MOUTH DAILY 135 tablet 3  . GLUCAGON EMERGENCY 1 MG injection INJECT 1MG INTO SKIN ONCE  AS NEEDED 3 each 1  . Glucosamine-Chondroit-Vit C-Mn (GLUCOSAMINE 1500 COMPLEX) CAPS Take 2 capsules by mouth daily at 12 noon.     . Insulin Glargine, 2 Unit Dial, (TOUJEO MAX SOLOSTAR) 300 UNIT/ML SOPN Inject 110 Units into the skin daily. 45 mL 3  . insulin lispro (HUMALOG) 100 UNIT/ML injection INJECT 20 TO 50 UNITS  SUBCUTANEOUSLY 3 TIMES  DAILY BEFORE MEALS PER SLIDING SCALE 140 mL 2  . montelukast (SINGULAIR) 10 MG tablet TAKE 1 TABLET BY MOUTH AT  BEDTIME 90 tablet 3  . multivitamin-lutein (OCUVITE-LUTEIN) CAPS capsule Take 1 capsule by mouth daily at 12 noon.    . pantoprazole (PROTONIX) 40 MG tablet Take 1 tablet (40 mg total) by mouth daily. 90 tablet 1  . sildenafil (REVATIO) 20 MG tablet TAKE 2-5 TABLETS AS NEEDED PRIOR TO SEXUAL ACTIVITY 50 tablet 1  . Testosterone 30 MG/ACT SOLN APPLY ONE PUMP TOPICALLY  UNDER EACH AXILLA DAILY  (TOTAL OF 60MG DAILY ) 270 mL 1  . valsartan (DIOVAN) 160 MG tablet TAKE 1 TABLET BY MOUTH  DAILY 90 tablet 3  . VENTOLIN HFA 108 (90 Base) MCG/ACT inhaler INHALE 2 INHALATIONS BY  MOUTH EVERY 6 HOURS AS  NEEDED FOR WHEEZING OR  SHORTNESS OF BREATH 72 g 0   No facility-administered medications prior to visit.    Review of Systems  Review of Systems  Constitutional: Negative.   Respiratory: Positive for cough. Negative for chest tightness, shortness of breath and wheezing.        Mild chronic dyspnea with exertion  Cardiovascular:       Occasional mid sternal chest pain lasting a few seconds  Psychiatric/Behavioral: Negative.    Physical Exam  BP 120/62 (BP Location: Left Arm, Cuff Size: Normal)   Pulse 66   Temp 98.2 F (36.8 C) (Temporal)   Ht 5' 10"  (1.778 m)   Wt 245 lb 3.2 oz (111.2 kg)   SpO2 94%   BMI 35.18  kg/m  Physical Exam Constitutional:      General: He is not in acute distress.    Appearance: Normal appearance. He is obese. He is not ill-appearing.  Cardiovascular:     Rate and Rhythm: Normal rate and regular rhythm.  Pulmonary:     Effort: Pulmonary effort is normal.     Breath sounds: Normal breath sounds. No wheezing or rhonchi.  Neurological:     General: No focal deficit present.     Mental Status: He is alert and oriented to person, place, and time. Mental status is at baseline.  Psychiatric:        Mood and Affect: Mood normal.        Behavior: Behavior normal.  Thought Content: Thought content normal.      Lab Results:  CBC    Component Value Date/Time   WBC 6.5 12/24/2019 0810   WBC 11.3 (H) 11/02/2018 0457   RBC 4.90 12/24/2019 0810   RBC 4.43 11/02/2018 0457   HGB 16.1 12/24/2019 0810   HCT 48.8 12/24/2019 0810   PLT 127 (L) 12/24/2019 0810   MCV 100 (H) 12/24/2019 0810   MCH 32.9 12/24/2019 0810   MCH 32.3 11/02/2018 0457   MCHC 33.0 12/24/2019 0810   MCHC 31.0 11/02/2018 0457   RDW 14.3 12/24/2019 0810   LYMPHSABS 1.5 12/24/2019 0810   MONOABS 1.2 (H) 10/25/2018 1000   EOSABS 0.2 12/24/2019 0810   BASOSABS 0.1 12/24/2019 0810    BMET    Component Value Date/Time   NA 142 12/24/2019 0810   K 4.4 12/24/2019 0810   CL 103 12/24/2019 0810   CO2 23 12/24/2019 0810   GLUCOSE 61 (L) 12/24/2019 0810   GLUCOSE 132 (H) 11/02/2018 0457   BUN 10 12/24/2019 0810   CREATININE 0.99 12/24/2019 0810   CREATININE 0.82 01/07/2013 0840   CALCIUM 9.5 12/24/2019 0810   GFRNONAA 77 12/24/2019 0810   GFRNONAA >89 01/07/2013 0840   GFRAA 89 12/24/2019 0810   GFRAA >89 01/07/2013 0840    BNP    Component Value Date/Time   BNP 10.6 04/21/2016 1148    ProBNP    Component Value Date/Time   PROBNP 30.0 02/21/2012 1008    Imaging: No results found.   Assessment & Plan:   OSA (obstructive sleep apnea) - Patient reports compliant with CPAP. He  has no issues with current pressure setting or mask. Benefiting from use.  - Continue CPAP every night 4-6 hours or more each night  - Advised not drive if experiencing excessive daytime fatigue or somnolence - Please bring in SD card for download for compliance check  - DME company does no have airview available  - [facility] 3/24 - FU in 1 year with Dr. Halford Chessman   ASTHMA - Stable interval, no recent exacerbations. He has a chronic cough. Very rare SABA use - PFTs in 2017 showed mild restriction, mild diffusion defect  - LDCT in June 2021 showed clear lungs, LUNG RADS 1s - Continue Albuterol as needed 2 puffs every 6 hours for shortness of breath or wheezing - Recommend repeat PFTs in 1 year   At risk for coronary artery disease - Reports occasional mid-sternal chest pain that does not last more than a few seconds. - No active chest pain today in office. Blood pressure stable.  - Patient has risk factors for CAD including obesity, diabetes, hx tobacco use - May consider cardiac testing such as exercise stress test or cardiac CT  - Follows with Cardiology, has an appointment with PCP tomorrow   Martyn Ehrich, NP 01/14/2020

## 2020-01-15 ENCOUNTER — Ambulatory Visit: Payer: Medicare Other | Admitting: Family Medicine

## 2020-01-15 ENCOUNTER — Ambulatory Visit: Payer: BC Managed Care – PPO | Admitting: Family Medicine

## 2020-01-15 ENCOUNTER — Encounter: Payer: Self-pay | Admitting: Family Medicine

## 2020-01-15 VITALS — BP 114/58 | HR 76 | Temp 97.9°F | Ht 70.0 in | Wt 244.0 lb

## 2020-01-15 DIAGNOSIS — Z794 Long term (current) use of insulin: Secondary | ICD-10-CM

## 2020-01-15 DIAGNOSIS — R748 Abnormal levels of other serum enzymes: Secondary | ICD-10-CM | POA: Diagnosis not present

## 2020-01-15 DIAGNOSIS — F439 Reaction to severe stress, unspecified: Secondary | ICD-10-CM

## 2020-01-15 DIAGNOSIS — E1169 Type 2 diabetes mellitus with other specified complication: Secondary | ICD-10-CM | POA: Diagnosis not present

## 2020-01-15 DIAGNOSIS — E1122 Type 2 diabetes mellitus with diabetic chronic kidney disease: Secondary | ICD-10-CM | POA: Diagnosis not present

## 2020-01-15 DIAGNOSIS — E1159 Type 2 diabetes mellitus with other circulatory complications: Secondary | ICD-10-CM

## 2020-01-15 DIAGNOSIS — I10 Essential (primary) hypertension: Secondary | ICD-10-CM

## 2020-01-15 DIAGNOSIS — E785 Hyperlipidemia, unspecified: Secondary | ICD-10-CM

## 2020-01-15 DIAGNOSIS — N182 Chronic kidney disease, stage 2 (mild): Secondary | ICD-10-CM

## 2020-01-15 DIAGNOSIS — I152 Hypertension secondary to endocrine disorders: Secondary | ICD-10-CM

## 2020-01-15 NOTE — Progress Notes (Signed)
Reviewed and agree with assessment/plan.   Chesley Mires, MD [facility] 01/15/2020, 7:05 AM Pager:  [phone]

## 2020-01-15 NOTE — Progress Notes (Signed)
Subjective: CC: Establish care, type 2 diabetes, stress at home PCP: [name], DO OEH:OZYYQMG Tony Cantu is a 69 y.o. male presenting to clinic today for:  1. Type 2 Diabetes with hypertension and hyperlipidemia:  Patient reports compliance with Lipitor 40 mg daily, Diovan 160 mg daily, Toujeo and Humalog.  He uses the freestyle libre for blood sugar monitoring.  Does not report any hyper or hypoglycemic episodes.  Last eye exam: Up-to-date Last foot exam: Needs Last A1c:  Lab Results  Component Value Date   HGBA1C 5.0 12/24/2019   Nephropathy screen indicated?:  On ARB Last flu, zoster and/or pneumovax:  Immunization History  Administered Date(s) Administered  . Fluad Quad(high Dose 65+) 05/17/2019  . Influenza Split 06/05/2013  . Influenza Whole 06/15/2010, 06/16/2011, 05/29/2012  . Influenza, High Dose Seasonal PF 06/25/2015, 06/12/2017  . Influenza,inj,Quad PF,6+ Mos 06/17/2014, 06/07/2016  . Influenza-Unspecified 05/22/2018  . Moderna SARS-COVID-2 Vaccination 11/07/2019, 12/05/2019  . Pneumococcal Conjugate-13 06/05/2013  . Pneumococcal Polysaccharide-23 06/03/2011, 09/16/2019  . Tdap 08/30/2007, 12/27/2017    ROS: Does not report any chest pain, shortness of breath, visual disturbance.  2.  Stress at home Patient reports some increased stress at home with his spouse.  The apparently cannot come to an agreement on decision making within the home.  He finds that there is a problem but she apparently often ignores and states that "there is not a problem".  They have been in couples counseling previously and she did seem to improve for a couple of years but has gone back to old habits.  He feels that revisiting couples counseling is futile.   ROS: Per HPI  Allergies  Allergen Reactions  . Erythromycin Diarrhea  . Sulfonamide Derivatives Diarrhea   Past Medical History:  Diagnosis Date  . Arthritis   . Asthma   . BPH (benign prostatic hypertrophy)   .  Colon polyps   . Diabetes mellitus without complication (HCC)    TYPE 1   . Gastritis   . GERD (gastroesophageal reflux disease)   . Headache    HX  . Hypertension   . PONV (postoperative nausea and vomiting)   . Seizures (HCC)    HYPOGLYCEMIC LAST 1 AND 1/2 YRS AGO  . Sleep apnea    uses CPAP nightly  . Testicle trouble    one testicle BORN WITH    Current Outpatient Medications:  .  aspirin 81 MG chewable tablet, Chew by mouth daily., Disp: , Rfl:  .  atorvastatin (LIPITOR) 40 MG tablet, TAKE 1 TABLET BY MOUTH  DAILY, Disp: 90 tablet, Rfl: 3 .  b complex vitamins tablet, Take 1 tablet by mouth daily at 12 noon. , Disp: , Rfl:  .  Calcium Carbonate-Vitamin D 600-400 MG-UNIT tablet, Take 1 tablet by mouth daily., Disp: , Rfl:  .  Cholecalciferol (VITAMIN D3) 2000 UNITS capsule, Take 2,000 Units by mouth daily at 12 noon. , Disp: , Rfl:  .  Continuous Blood Gluc Receiver (FREESTYLE LIBRE READER) DEVI, 1 applicator by Does not apply route as directed., Disp: 1 Device, Rfl: 1 .  Continuous Blood Gluc Sensor (FREESTYLE LIBRE SENSOR SYSTEM) MISC, Check BS eight (8) times a day. Dx E10.9, Disp: 3 each, Rfl: 3 .  fexofenadine (ALLEGRA) 180 MG tablet, Take 1 tablet (180 mg total) by mouth daily with breakfast., Disp: 90 tablet, Rfl: 3 .  furosemide (LASIX) 20 MG tablet, TAKE 1 AND 1/2 TABLETS BY  MOUTH DAILY, Disp: 135 tablet, Rfl: 3 .  GLUCAGON EMERGENCY 1 MG injection, INJECT 1MG INTO SKIN ONCE  AS NEEDED, Disp: 3 each, Rfl: 1 .  Glucosamine-Chondroit-Vit C-Mn (GLUCOSAMINE 1500 COMPLEX) CAPS, Take 2 capsules by mouth daily at 12 noon. , Disp: , Rfl:  .  Insulin Glargine, 2 Unit Dial, (TOUJEO MAX SOLOSTAR) 300 UNIT/ML SOPN, Inject 110 Units into the skin daily., Disp: 45 mL, Rfl: 3 .  insulin lispro (HUMALOG) 100 UNIT/ML injection, INJECT 20 TO 50 UNITS  SUBCUTANEOUSLY 3 TIMES  DAILY BEFORE MEALS PER SLIDING SCALE, Disp: 140 mL, Rfl: 2 .  montelukast (SINGULAIR) 10 MG tablet, TAKE 1 TABLET BY  MOUTH AT  BEDTIME, Disp: 90 tablet, Rfl: 3 .  multivitamin-lutein (OCUVITE-LUTEIN) CAPS capsule, Take 1 capsule by mouth daily at 12 noon., Disp: , Rfl:  .  pantoprazole (PROTONIX) 40 MG tablet, Take 1 tablet (40 mg total) by mouth daily., Disp: 90 tablet, Rfl: 1 .  sildenafil (REVATIO) 20 MG tablet, TAKE 2-5 TABLETS AS NEEDED PRIOR TO SEXUAL ACTIVITY, Disp: 50 tablet, Rfl: 1 .  Testosterone 30 MG/ACT SOLN, APPLY ONE PUMP TOPICALLY  UNDER EACH AXILLA DAILY  (TOTAL OF 60MG DAILY ), Disp: 270 mL, Rfl: 1 .  valsartan (DIOVAN) 160 MG tablet, TAKE 1 TABLET BY MOUTH  DAILY, Disp: 90 tablet, Rfl: 3 .  VENTOLIN HFA 108 (90 Base) MCG/ACT inhaler, INHALE 2 INHALATIONS BY  MOUTH EVERY 6 HOURS AS  NEEDED FOR WHEEZING OR  SHORTNESS OF BREATH, Disp: 72 g, Rfl: 0 Social History   Socioeconomic History  . Marital status: Married    Spouse name: Not on file  . Number of children: Not on file  . Years of education: Not on file  . Highest education level: Not on file  Occupational History  . Occupation: retired   Tobacco Use  . Smoking status: Former Smoker    Years: 34.00    Types: Cigarettes, Pipe  . Smokeless tobacco: Never Used  . Tobacco comment: quit 2005 smoked cigarettes for 5 yrs prior to pipe use  Substance and Sexual Activity  . Alcohol use: No    Comment: once a year  . Drug use: No  . Sexual activity: Yes  Other Topics Concern  . Not on file  Social History Narrative  . Not on file   Social Determinants of Health   Financial Resource Strain:   . Difficulty of Paying Living Expenses:   Food Insecurity:   . Worried About Charity fundraiser in the Last Year:   . Arboriculturist in the Last Year:   Transportation Needs:   . Film/video editor (Medical):   Marland Kitchen Lack of Transportation (Non-Medical):   Physical Activity:   . Days of Exercise per Week:   . Minutes of Exercise per Session:   Stress:   . Feeling of Stress :   Social Connections:   . Frequency of Communication with  Friends and Family:   . Frequency of Social Gatherings with Friends and Family:   . Attends Religious Services:   . Active Member of Clubs or Organizations:   . Attends Archivist Meetings:   Marland Kitchen Marital Status:   Intimate Partner Violence:   . Fear of Current or Ex-Partner:   . Emotionally Abused:   Marland Kitchen Physically Abused:   . Sexually Abused:    Family History  Problem Relation Age of Onset  . Heart failure Mother   . Heart attack Mother        in her 28's  .  Alzheimer's disease Mother   . Diabetes Mother   . Asthma Father   . Suicidality Father        in his 57's  . Allergic rhinitis Sister   . Heart failure Maternal Grandmother   . Rheum arthritis Maternal Grandmother   . Breast cancer Maternal Grandmother   . Heart attack Maternal Grandmother        in her 48's  . Colon cancer Neg Hx   . Esophageal cancer Neg Hx   . Pancreatic cancer Neg Hx   . Liver disease Neg Hx     Objective: Office vital signs reviewed. BP (!) 114/58   Pulse 76   Temp 97.9 F (36.6 C) (Temporal)   Ht 5' 10"  (1.778 m)   Wt 244 lb (110.7 kg)   SpO2 96%   BMI 35.01 kg/m   Physical Examination:  General: Awake, alert, well nourished, No acute distress HEENT: Normal. Sclera white, MMM Cardio: regular rate and rhythm, S1S2 heard, 2/6 SEM appreciated at LSB Pulm: clear to auscultation bilaterally, no wheezes, rhonchi or rales; normal work of breathing on room air Extremities: warm, well perfused, trace ankle edema on left. No cyanosis or clubbing; +1 pulses bilaterally MSK: normal gait and station Skin: dry; intact; no rashes or lesions Neuro: See Dm foot Psych: Mood stable, speech normal, eye contact good, patient is pleasant and interactive  Depression screen [facility] 2/9 01/15/2020 09/16/2019 05/17/2019  Decreased Interest 1 0 0  Down, Depressed, Hopeless - 0 0  PHQ - 2 Score 1 0 0  Altered sleeping 0 - -  Tired, decreased energy 0 - -  Change in appetite 0 - -  Feeling bad or  failure about yourself  0 - -  Trouble concentrating 0 - -  Moving slowly or fidgety/restless 0 - -  Suicidal thoughts 0 - -  PHQ-9 Score 1 - -  Some recent data might be hidden   No flowsheet data found.  Diabetic Foot Exam - Simple   Simple Foot Form Diabetic Foot exam was performed with the following findings: Yes 01/15/2020  9:33 AM  Visual Inspection See comments: Yes Sensation Testing Intact to touch and monofilament testing bilaterally: Yes Pulse Check Posterior Tibialis and Dorsalis pulse intact bilaterally: Yes Comments +1 pedal pulses.  No ulcerations or preulcerative calluses noted.  He has intact monofilament testing and vibratory sensation.  Webbing of medial digits noted bilaterally.     Assessment/ Plan: 69 y.o. male   1. Controlled type 2 diabetes mellitus with stage 2 chronic kidney disease, with long-term current use of insulin ([place]) Diabetes remains under excellent control with A1c of 5.0.  No changes to medication regimen  2. Hypertension associated with type 2 diabetes mellitus ([place]) Well-controlled.  Continue current regimen.  Ok to increase lasix to 33m daily for 2-3 days if needed for edema.  Then resume normal regimen  3. Hyperlipidemia associated with type 2 diabetes mellitus ([place] Continue statin  4. Elevated liver enzymes Patient with known cirrhosis of the liver.  He avoids alcohol and excessive Tylenol.  Will check liver enzymes again next visit to ensure normalization..  5. Stress at home Reinforced consideration for counseling if he wishes to preserve marriage.  Would approach this more as a long needed therapy rather than intermittent.  However, it sounds like they may be moving towards separation.  Not sure at this time that medication is appropriate but I do think that maybe individual counseling may be of benefit to this  patient.  Will continue to address this with H visit.   No orders of the defined types were placed in this  encounter.  No orders of the defined types were placed in this encounter.    [name], DO [place] [phone]

## 2020-01-15 NOTE — Patient Instructions (Signed)
Your A1c was well controlled at 5.0. Liver function tests were slightly elevated though and we will repeat those today.  I will reach out to Dr Percival Spanish about stress testing and/or CT

## 2020-01-21 DIAGNOSIS — Z794 Long term (current) use of insulin: Secondary | ICD-10-CM | POA: Diagnosis not present

## 2020-01-21 DIAGNOSIS — E108 Type 1 diabetes mellitus with unspecified complications: Secondary | ICD-10-CM | POA: Diagnosis not present

## 2020-01-26 DIAGNOSIS — G4733 Obstructive sleep apnea (adult) (pediatric): Secondary | ICD-10-CM | POA: Diagnosis not present

## 2020-01-28 DIAGNOSIS — M1712 Unilateral primary osteoarthritis, left knee: Secondary | ICD-10-CM | POA: Diagnosis not present

## 2020-01-28 DIAGNOSIS — M1711 Unilateral primary osteoarthritis, right knee: Secondary | ICD-10-CM | POA: Diagnosis not present

## 2020-01-31 ENCOUNTER — Encounter: Payer: Self-pay | Admitting: Family Medicine

## 2020-01-31 DIAGNOSIS — M25512 Pain in left shoulder: Secondary | ICD-10-CM | POA: Diagnosis not present

## 2020-01-31 DIAGNOSIS — Z471 Aftercare following joint replacement surgery: Secondary | ICD-10-CM | POA: Diagnosis not present

## 2020-01-31 DIAGNOSIS — Z96611 Presence of right artificial shoulder joint: Secondary | ICD-10-CM | POA: Diagnosis not present

## 2020-02-07 ENCOUNTER — Encounter: Payer: Self-pay | Admitting: Family Medicine

## 2020-02-07 ENCOUNTER — Other Ambulatory Visit: Payer: Self-pay

## 2020-02-07 ENCOUNTER — Ambulatory Visit: Payer: Medicare Other | Admitting: Family Medicine

## 2020-02-07 VITALS — BP 115/67 | HR 71 | Temp 98.1°F | Ht 70.0 in | Wt 244.2 lb

## 2020-02-07 DIAGNOSIS — D696 Thrombocytopenia, unspecified: Secondary | ICD-10-CM | POA: Diagnosis not present

## 2020-02-07 DIAGNOSIS — M25472 Effusion, left ankle: Secondary | ICD-10-CM | POA: Diagnosis not present

## 2020-02-07 NOTE — Patient Instructions (Signed)
Edema  Edema is when you have too much fluid in your body or under your skin. Edema may make your legs, feet, and ankles swell up. Swelling is also common in looser tissues, like around your eyes. This is a common condition. It gets more common as you get older. There are many possible causes of edema. Eating too much salt (sodium) and being on your feet or sitting for a long time can cause edema in your legs, feet, and ankles. Hot weather may make edema worse. Edema is usually painless. Your skin may look swollen or shiny. Follow these instructions at home:  Keep the swollen body part raised (elevated) above the level of your heart when you are sitting or lying down.  Do not sit still or stand for a long time.  Do not wear tight clothes. Do not wear garters on your upper legs.  Exercise your legs. This can help the swelling go down.  Wear elastic bandages or support stockings as told by your doctor.  Eat a low-salt (low-sodium) diet to reduce fluid as told by your doctor.  Depending on the cause of your swelling, you may need to limit how much fluid you drink (fluid restriction).  Take over-the-counter and prescription medicines only as told by your doctor. Contact a doctor if:  Treatment is not working.  You have heart, liver, or kidney disease and have symptoms of edema.  You have sudden and unexplained weight gain. Get help right away if:  You have shortness of breath or chest pain.  You cannot breathe when you lie down.  You have pain, redness, or warmth in the swollen areas.  You have heart, liver, or kidney disease and get edema all of a sudden.  You have a fever and your symptoms get worse all of a sudden. Summary  Edema is when you have too much fluid in your body or under your skin.  Edema may make your legs, feet, and ankles swell up. Swelling is also common in looser tissues, like around your eyes.  Raise (elevate) the swollen body part above the level of your  heart when you are sitting or lying down.  Follow your doctor's instructions about diet and how much fluid you can drink (fluid restriction). This information is not intended to replace advice given to you by your health care provider. Make sure you discuss any questions you have with your health care provider. Document Revised: 08/18/2017 Document Reviewed: 09/02/2016 Elsevier Patient Education  2020 [employer]  

## 2020-02-07 NOTE — Progress Notes (Signed)
Assessment & Plan:  1. Edema of left ankle - Education provided on edema.  Encouraged compression hose and to make sure he gets all of the wrinkles out of them.  Elevate lower extremities as much as possible.  Low-salt diet.  Decrease furosemide back to normal regimen. - BMP8+EGFR - Uric acid  2. Thrombocytopenia ([place]) - CBC with Differential/Platelet   Follow up plan: Return if symptoms worsen or fail to improve.  Hendricks Limes, MSN, APRN, FNP-C [facility]  Subjective:   Patient ID: Tony Cantu, male    DOB: December 13, 1950, 69 y.o.   MRN: [id number]  HPI: Tony Cantu is a 69 y.o. male presenting on 02/07/2020 for Edema (left ankle.  Follow up- patient states that the swelling is up and down.)  Patient reports left ankle swelling that is up and down.  He was seen by his PCP a couple of weeks ago at which time he was advised he may increase furosemide to 40 mg daily for 2 to 3 days as needed and then resume his normal regimen.  He has continued the 40 mg since he saw her.  Patient reports he did wear compression hose once but they left swelling like a doughnut around his ankle.   ROS: Negative unless specifically indicated above in HPI.   Relevant past medical history reviewed and updated as indicated.   Allergies and medications reviewed and updated.   Current Outpatient Medications:  .  aspirin 81 MG chewable tablet, Chew by mouth daily., Disp: , Rfl:  .  atorvastatin (LIPITOR) 40 MG tablet, TAKE 1 TABLET BY MOUTH  DAILY, Disp: 90 tablet, Rfl: 3 .  b complex vitamins tablet, Take 1 tablet by mouth daily at 12 noon. , Disp: , Rfl:  .  Calcium Carbonate-Vitamin D 600-400 MG-UNIT tablet, Take 1 tablet by mouth daily., Disp: , Rfl:  .  Cholecalciferol (VITAMIN D3) 2000 UNITS capsule, Take 2,000 Units by mouth daily at 12 noon. , Disp: , Rfl:  .  Continuous Blood Gluc Receiver (FREESTYLE LIBRE READER) DEVI, 1 applicator by Does not apply route as directed.,  Disp: 1 Device, Rfl: 1 .  Continuous Blood Gluc Sensor (FREESTYLE LIBRE SENSOR SYSTEM) MISC, Check BS eight (8) times a day. Dx E10.9, Disp: 3 each, Rfl: 3 .  fexofenadine (ALLEGRA) 180 MG tablet, Take 1 tablet (180 mg total) by mouth daily with breakfast., Disp: 90 tablet, Rfl: 3 .  furosemide (LASIX) 20 MG tablet, TAKE 1 AND 1/2 TABLETS BY  MOUTH DAILY, Disp: 135 tablet, Rfl: 3 .  GLUCAGON EMERGENCY 1 MG injection, INJECT 1MG INTO SKIN ONCE  AS NEEDED, Disp: 3 each, Rfl: 1 .  Glucosamine-Chondroit-Vit C-Mn (GLUCOSAMINE 1500 COMPLEX) CAPS, Take 2 capsules by mouth daily at 12 noon. , Disp: , Rfl:  .  Insulin Glargine, 2 Unit Dial, (TOUJEO MAX SOLOSTAR) 300 UNIT/ML SOPN, Inject 110 Units into the skin daily., Disp: 45 mL, Rfl: 3 .  insulin lispro (HUMALOG) 100 UNIT/ML injection, INJECT 20 TO 50 UNITS  SUBCUTANEOUSLY 3 TIMES  DAILY BEFORE MEALS PER SLIDING SCALE, Disp: 140 mL, Rfl: 2 .  montelukast (SINGULAIR) 10 MG tablet, TAKE 1 TABLET BY MOUTH AT  BEDTIME, Disp: 90 tablet, Rfl: 3 .  multivitamin-lutein (OCUVITE-LUTEIN) CAPS capsule, Take 1 capsule by mouth daily at 12 noon., Disp: , Rfl:  .  pantoprazole (PROTONIX) 40 MG tablet, Take 1 tablet (40 mg total) by mouth daily., Disp: 90 tablet, Rfl: 1 .  sildenafil (REVATIO) 20  MG tablet, TAKE 2-5 TABLETS AS NEEDED PRIOR TO SEXUAL ACTIVITY, Disp: 50 tablet, Rfl: 1 .  Testosterone 30 MG/ACT SOLN, APPLY ONE PUMP TOPICALLY  UNDER EACH AXILLA DAILY  (TOTAL OF 60MG DAILY ), Disp: 270 mL, Rfl: 1 .  valsartan (DIOVAN) 160 MG tablet, TAKE 1 TABLET BY MOUTH  DAILY, Disp: 90 tablet, Rfl: 3  Allergies  Allergen Reactions  . Erythromycin Diarrhea  . Sulfonamide Derivatives Diarrhea    Objective:   BP 115/67   Pulse 71   Temp 98.1 F (36.7 C) (Temporal)   Ht 5' 10"  (1.778 m)   Wt 244 lb 3.2 oz (110.8 kg)   SpO2 93%   BMI 35.04 kg/m    Physical Exam Vitals reviewed.  Constitutional:      General: He is not in acute distress.    Appearance: Normal  appearance. He is not ill-appearing, toxic-appearing or diaphoretic.  HENT:     Head: Normocephalic and atraumatic.  Eyes:     General: No scleral icterus.       Right eye: No discharge.        Left eye: No discharge.     Conjunctiva/sclera: Conjunctivae normal.  Cardiovascular:     Rate and Rhythm: Normal rate and regular rhythm.     Heart sounds: Normal heart sounds. No murmur heard.  No friction rub. No gallop.   Pulmonary:     Effort: Pulmonary effort is normal. No respiratory distress.     Breath sounds: Normal breath sounds. No stridor. No wheezing, rhonchi or rales.  Musculoskeletal:        General: Normal range of motion.     Cervical back: Normal range of motion.     Left lower leg: Edema present.  Skin:    General: Skin is warm and dry.  Neurological:     Mental Status: He is alert and oriented to person, place, and time. Mental status is at baseline.  Psychiatric:        Mood and Affect: Mood normal.        Behavior: Behavior normal.        Thought Content: Thought content normal.        Judgment: Judgment normal.

## 2020-02-08 LAB — BMP8+EGFR
BUN/Creatinine Ratio: 13 (ref 10–24)
BUN: 14 mg/dL (ref 8–27)
CO2: 18 mmol/L — ABNORMAL LOW (ref 20–29)
Calcium: 9.6 mg/dL (ref 8.6–10.2)
Chloride: 105 mmol/L (ref 96–106)
Creatinine, Ser: 1.04 mg/dL (ref 0.76–1.27)
GFR calc Af Amer: 84 mL/min/{1.73_m2} (ref >59)
GFR calc non Af Amer: 72 mL/min/{1.73_m2} (ref >59)
Glucose: 172 mg/dL — ABNORMAL HIGH (ref 65–99)
Potassium: 4 mmol/L (ref 3.5–5.2)
Sodium: 142 mmol/L (ref 134–144)

## 2020-02-08 LAB — CBC WITH DIFFERENTIAL/PLATELET
Basophils Absolute: 0.1 10*3/uL (ref 0.0–0.2)
Basos: 1 %
EOS (ABSOLUTE): 0.2 10*3/uL (ref 0.0–0.4)
Eos: 3 %
Hematocrit: 45.7 % (ref 37.5–51.0)
Hemoglobin: 15.7 g/dL (ref 13.0–17.7)
Immature Grans (Abs): 0 10*3/uL (ref 0.0–0.1)
Immature Granulocytes: 0 %
Lymphocytes Absolute: 1.5 10*3/uL (ref 0.7–3.1)
Lymphs: 25 %
MCH: 33.6 pg — ABNORMAL HIGH (ref 26.6–33.0)
MCHC: 34.4 g/dL (ref 31.5–35.7)
MCV: 98 fL — ABNORMAL HIGH (ref 79–97)
Monocytes Absolute: 1 10*3/uL — ABNORMAL HIGH (ref 0.1–0.9)
Monocytes: 17 %
Neutrophils Absolute: 3.2 10*3/uL (ref 1.4–7.0)
Neutrophils: 54 %
Platelets: 123 10*3/uL — ABNORMAL LOW (ref 150–450)
RBC: 4.67 x10E6/uL (ref 4.14–5.80)
RDW: 14.2 % (ref 11.6–15.4)
WBC: 6 10*3/uL (ref 3.4–10.8)

## 2020-02-08 LAB — URIC ACID: Uric Acid: 6.1 mg/dL (ref 3.8–8.4)

## 2020-02-18 ENCOUNTER — Telehealth: Payer: Self-pay | Admitting: *Deleted

## 2020-02-18 ENCOUNTER — Other Ambulatory Visit: Payer: Self-pay | Admitting: Family Medicine

## 2020-02-18 MED ORDER — ALBUTEROL SULFATE HFA 108 (90 BASE) MCG/ACT IN AERS: 2.0000 | INHALATION_SPRAY | Freq: Four times a day (QID) | RESPIRATORY_TRACT | 0 refills | Status: DC | PRN

## 2020-02-18 NOTE — Telephone Encounter (Signed)
Fax from [facility] Request for Ventolin HFA Aer 90 mcg 2 puffs Q 6 hr prn No longer on med list

## 2020-02-21 DIAGNOSIS — E108 Type 1 diabetes mellitus with unspecified complications: Secondary | ICD-10-CM | POA: Diagnosis not present

## 2020-02-21 DIAGNOSIS — Z794 Long term (current) use of insulin: Secondary | ICD-10-CM | POA: Diagnosis not present

## 2020-02-24 ENCOUNTER — Encounter: Payer: Self-pay | Admitting: Family Medicine

## 2020-02-25 DIAGNOSIS — M1711 Unilateral primary osteoarthritis, right knee: Secondary | ICD-10-CM | POA: Diagnosis not present

## 2020-02-25 DIAGNOSIS — M1712 Unilateral primary osteoarthritis, left knee: Secondary | ICD-10-CM | POA: Diagnosis not present

## 2020-02-26 DIAGNOSIS — G4733 Obstructive sleep apnea (adult) (pediatric): Secondary | ICD-10-CM | POA: Diagnosis not present

## 2020-03-03 DIAGNOSIS — E108 Type 1 diabetes mellitus with unspecified complications: Secondary | ICD-10-CM | POA: Diagnosis not present

## 2020-03-03 DIAGNOSIS — M1712 Unilateral primary osteoarthritis, left knee: Secondary | ICD-10-CM | POA: Diagnosis not present

## 2020-03-03 DIAGNOSIS — M1711 Unilateral primary osteoarthritis, right knee: Secondary | ICD-10-CM | POA: Diagnosis not present

## 2020-03-05 ENCOUNTER — Other Ambulatory Visit: Payer: Self-pay

## 2020-03-05 ENCOUNTER — Telehealth: Payer: Self-pay

## 2020-03-05 ENCOUNTER — Other Ambulatory Visit: Payer: Self-pay | Admitting: *Deleted

## 2020-03-05 DIAGNOSIS — N182 Chronic kidney disease, stage 2 (mild): Secondary | ICD-10-CM

## 2020-03-05 DIAGNOSIS — E1122 Type 2 diabetes mellitus with diabetic chronic kidney disease: Secondary | ICD-10-CM

## 2020-03-05 DIAGNOSIS — K7460 Unspecified cirrhosis of liver: Secondary | ICD-10-CM

## 2020-03-05 MED ORDER — INSULIN LISPRO 100 UNIT/ML ~~LOC~~ SOLN: SUBCUTANEOUS | 0 refills | Status: DC

## 2020-03-05 NOTE — Telephone Encounter (Signed)
-----   Message from Algernon Huxley, RN sent at 03/05/2019  3:22 PM EDT ----- Regarding: MRI/US Pt needs repeat scans

## 2020-03-05 NOTE — Telephone Encounter (Signed)
Pt scheduled for MR of abd and abd [place] at [facility] 03/19/20. Pt to arrive in xray that morning at 7:45am for [place] appt at 8am, MR of abd to follow at 9am. Pt to be NPO after midnight. Left message for pt to call back.

## 2020-03-06 NOTE — Telephone Encounter (Signed)
Spoke with pt and he is aware of appt and instructions.

## 2020-03-10 DIAGNOSIS — M1712 Unilateral primary osteoarthritis, left knee: Secondary | ICD-10-CM | POA: Diagnosis not present

## 2020-03-10 DIAGNOSIS — M1711 Unilateral primary osteoarthritis, right knee: Secondary | ICD-10-CM | POA: Diagnosis not present

## 2020-03-19 ENCOUNTER — Other Ambulatory Visit: Payer: Self-pay

## 2020-03-19 ENCOUNTER — Ambulatory Visit (HOSPITAL_COMMUNITY)
Admission: RE | Admit: 2020-03-19 | Discharge: 2020-03-19 | Disposition: A | Discharge status: Home / Self Care | Payer: BC Managed Care – PPO | Source: Ambulatory Visit | Attending: Internal Medicine | Admitting: Internal Medicine

## 2020-03-19 DIAGNOSIS — I700 Atherosclerosis of aorta: Secondary | ICD-10-CM | POA: Diagnosis not present

## 2020-03-19 DIAGNOSIS — K5730 Diverticulosis of large intestine without perforation or abscess without bleeding: Secondary | ICD-10-CM | POA: Diagnosis not present

## 2020-03-19 DIAGNOSIS — K766 Portal hypertension: Secondary | ICD-10-CM | POA: Diagnosis not present

## 2020-03-19 DIAGNOSIS — Q600 Renal agenesis, unilateral: Secondary | ICD-10-CM | POA: Diagnosis not present

## 2020-03-19 DIAGNOSIS — N281 Cyst of kidney, acquired: Secondary | ICD-10-CM | POA: Diagnosis not present

## 2020-03-19 DIAGNOSIS — K7460 Unspecified cirrhosis of liver: Secondary | ICD-10-CM | POA: Diagnosis not present

## 2020-03-19 MED ORDER — GADOBUTROL 1 MMOL/ML IV SOLN
10.0000 mL | Freq: Once | INTRAVENOUS | Status: AC | PRN
  Administered 2020-03-19: 10 mL via INTRAVENOUS

## 2020-03-23 ENCOUNTER — Telehealth: Payer: Self-pay | Admitting: Family Medicine

## 2020-03-23 NOTE — Telephone Encounter (Signed)
Approved.  

## 2020-03-27 DIAGNOSIS — G4733 Obstructive sleep apnea (adult) (pediatric): Secondary | ICD-10-CM | POA: Diagnosis not present

## 2020-04-10 ENCOUNTER — Encounter: Payer: Self-pay | Admitting: *Deleted

## 2020-04-27 ENCOUNTER — Encounter: Payer: Self-pay | Admitting: Family Medicine

## 2020-04-27 ENCOUNTER — Other Ambulatory Visit: Payer: Self-pay | Admitting: *Deleted

## 2020-04-27 DIAGNOSIS — E785 Hyperlipidemia, unspecified: Secondary | ICD-10-CM

## 2020-04-27 DIAGNOSIS — E1169 Type 2 diabetes mellitus with other specified complication: Secondary | ICD-10-CM

## 2020-04-27 DIAGNOSIS — E559 Vitamin D deficiency, unspecified: Secondary | ICD-10-CM

## 2020-04-27 DIAGNOSIS — N182 Chronic kidney disease, stage 2 (mild): Secondary | ICD-10-CM

## 2020-04-27 DIAGNOSIS — E349 Endocrine disorder, unspecified: Secondary | ICD-10-CM

## 2020-04-27 DIAGNOSIS — E1122 Type 2 diabetes mellitus with diabetic chronic kidney disease: Secondary | ICD-10-CM

## 2020-04-27 DIAGNOSIS — E1159 Type 2 diabetes mellitus with other circulatory complications: Secondary | ICD-10-CM

## 2020-04-27 DIAGNOSIS — I152 Hypertension secondary to endocrine disorders: Secondary | ICD-10-CM

## 2020-05-05 ENCOUNTER — Other Ambulatory Visit: Payer: BC Managed Care – PPO

## 2020-05-05 ENCOUNTER — Other Ambulatory Visit: Payer: Self-pay

## 2020-05-05 DIAGNOSIS — E1159 Type 2 diabetes mellitus with other circulatory complications: Secondary | ICD-10-CM | POA: Diagnosis not present

## 2020-05-05 DIAGNOSIS — E559 Vitamin D deficiency, unspecified: Secondary | ICD-10-CM | POA: Diagnosis not present

## 2020-05-05 DIAGNOSIS — E1122 Type 2 diabetes mellitus with diabetic chronic kidney disease: Secondary | ICD-10-CM | POA: Diagnosis not present

## 2020-05-05 DIAGNOSIS — Z794 Long term (current) use of insulin: Secondary | ICD-10-CM | POA: Diagnosis not present

## 2020-05-05 DIAGNOSIS — I10 Essential (primary) hypertension: Secondary | ICD-10-CM | POA: Diagnosis not present

## 2020-05-05 DIAGNOSIS — N182 Chronic kidney disease, stage 2 (mild): Secondary | ICD-10-CM | POA: Diagnosis not present

## 2020-05-05 DIAGNOSIS — E785 Hyperlipidemia, unspecified: Secondary | ICD-10-CM

## 2020-05-05 DIAGNOSIS — E349 Endocrine disorder, unspecified: Secondary | ICD-10-CM

## 2020-05-05 DIAGNOSIS — I152 Hypertension secondary to endocrine disorders: Secondary | ICD-10-CM

## 2020-05-05 DIAGNOSIS — E1169 Type 2 diabetes mellitus with other specified complication: Secondary | ICD-10-CM

## 2020-05-05 LAB — BAYER DCA HB A1C WAIVED: HB A1C (BAYER DCA - WAIVED): 5.1 % (ref <7.0)

## 2020-05-07 ENCOUNTER — Encounter: Payer: Self-pay | Admitting: Family Medicine

## 2020-05-08 LAB — CBC WITH DIFFERENTIAL/PLATELET
Basophils Absolute: 0.1 10*3/uL (ref 0.0–0.2)
Basos: 1 %
EOS (ABSOLUTE): 0.2 10*3/uL (ref 0.0–0.4)
Eos: 3 %
Hematocrit: 52.3 % — ABNORMAL HIGH (ref 37.5–51.0)
Hemoglobin: 17.3 g/dL (ref 13.0–17.7)
Immature Grans (Abs): 0 10*3/uL (ref 0.0–0.1)
Immature Granulocytes: 0 %
Lymphocytes Absolute: 1.5 10*3/uL (ref 0.7–3.1)
Lymphs: 26 %
MCH: 33.7 pg — ABNORMAL HIGH (ref 26.6–33.0)
MCHC: 33.1 g/dL (ref 31.5–35.7)
MCV: 102 fL — ABNORMAL HIGH (ref 79–97)
Monocytes Absolute: 1 10*3/uL — ABNORMAL HIGH (ref 0.1–0.9)
Monocytes: 17 %
Neutrophils Absolute: 3 10*3/uL (ref 1.4–7.0)
Neutrophils: 53 %
Platelets: 115 10*3/uL — ABNORMAL LOW (ref 150–450)
RBC: 5.13 x10E6/uL (ref 4.14–5.80)
RDW: 13.4 % (ref 11.6–15.4)
WBC: 5.7 10*3/uL (ref 3.4–10.8)

## 2020-05-08 LAB — CMP14+EGFR
ALT: 38 IU/L (ref 0–44)
AST: 47 IU/L — ABNORMAL HIGH (ref 0–40)
Albumin/Globulin Ratio: 1.2 (ref 1.2–2.2)
Albumin: 4.2 g/dL (ref 3.8–4.8)
Alkaline Phosphatase: 76 IU/L (ref 48–121)
BUN/Creatinine Ratio: 13 (ref 10–24)
BUN: 14 mg/dL (ref 8–27)
Bilirubin Total: 1.1 mg/dL (ref 0.0–1.2)
CO2: 23 mmol/L (ref 20–29)
Calcium: 9.9 mg/dL (ref 8.6–10.2)
Chloride: 102 mmol/L (ref 96–106)
Creatinine, Ser: 1.04 mg/dL (ref 0.76–1.27)
GFR calc Af Amer: 84 mL/min/{1.73_m2} (ref >59)
GFR calc non Af Amer: 72 mL/min/{1.73_m2} (ref >59)
Globulin, Total: 3.6 g/dL (ref 1.5–4.5)
Glucose: 115 mg/dL — ABNORMAL HIGH (ref 65–99)
Potassium: 4.5 mmol/L (ref 3.5–5.2)
Sodium: 143 mmol/L (ref 134–144)
Total Protein: 7.8 g/dL (ref 6.0–8.5)

## 2020-05-08 LAB — LIPID PANEL
Chol/HDL Ratio: 2.4 ratio (ref 0.0–5.0)
Cholesterol, Total: 132 mg/dL (ref 100–199)
HDL: 55 mg/dL (ref >39)
LDL Chol Calc (NIH): 62 mg/dL (ref 0–99)
Triglycerides: 73 mg/dL (ref 0–149)
VLDL Cholesterol Cal: 15 mg/dL (ref 5–40)

## 2020-05-08 LAB — TESTOSTERONE,FREE AND TOTAL
Testosterone, Free: 9 pg/mL (ref 6.6–18.1)
Testosterone: 718 ng/dL (ref 264–916)

## 2020-05-08 LAB — VITAMIN D 25 HYDROXY (VIT D DEFICIENCY, FRACTURES): Vit D, 25-Hydroxy: 38.4 ng/mL (ref 30.0–100.0)

## 2020-05-12 ENCOUNTER — Encounter: Payer: Self-pay | Admitting: Internal Medicine

## 2020-05-12 ENCOUNTER — Ambulatory Visit: Payer: BC Managed Care – PPO | Admitting: Internal Medicine

## 2020-05-12 VITALS — BP 104/60 | HR 74 | Ht 70.0 in | Wt 242.1 lb

## 2020-05-12 DIAGNOSIS — Z86010 Personal history of colonic polyps: Secondary | ICD-10-CM | POA: Diagnosis not present

## 2020-05-12 DIAGNOSIS — K7460 Unspecified cirrhosis of liver: Secondary | ICD-10-CM

## 2020-05-12 DIAGNOSIS — K766 Portal hypertension: Secondary | ICD-10-CM | POA: Diagnosis not present

## 2020-05-12 DIAGNOSIS — R151 Fecal smearing: Secondary | ICD-10-CM | POA: Diagnosis not present

## 2020-05-12 NOTE — Progress Notes (Signed)
Subjective:    Patient ID: Tony Cantu, male    DOB: [birth year], 69 y.o.   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  He was last seen in summer 2020 for office visit and EGD/colonoscopy.  EGD/colonoscopy performed on 03/30/2019 EGD revealed normal esophagus.  No varices.  There was one cratered gastric ulcer without stigmata in the prepyloric gastric antrum.  Biopsies were taken which showed chronic gastritis without H. pylori.  Examined duodenum was normal Colonoscopy to the cecum revealed 4 polyps ranging in size from 3 to 6 mm, diverticulosis in the left colon.  These polyps were tubular adenomas.  He reports that he has been feeling well.  He has had no significant abdominal pain.  He has remained on pantoprazole 40 mg daily.  No significant abdominal swelling or lower extremity edema.  He does have some chronic lower extremity edema for which he takes furosemide.  His left leg seems to swell occasionally more than the right.  No confusion or bleeding.  No itching.  He does have occasional fecal smearing though bowel movements are fairly regular.  No dysphagia or odynophagia symptoms.  He did have recent MRI abdomen which I reviewed and we reviewed together   Review of Systems As per HPI, otherwise negative  Current Medications, Allergies, Past Medical History, Past Surgical History, Family History and Social History were reviewed in [employer] record.     Objective:   Physical Exam BP 104/60 (BP Location: Left Arm, Patient Position: Sitting, Cuff Size: Large)   Pulse 74   Ht 5' 10"  (1.778 m)   Wt 242 lb 2 oz (109.8 kg)   SpO2 95%   BMI 34.74 kg/m  Gen: awake, alert, NAD HEENT: anicteric CV: RRR, no mrg Pulm: CTA b/l Abd: soft, obese, NT/ND, +BS throughout Ext: no c/c/e Neuro:  nonfocal, no asterixis  CBC    Component Value Date/Time   WBC 5.7 05/05/2020 0805   WBC 11.3 (H) 11/02/2018 0457   RBC 5.13 05/05/2020 0805   RBC 4.43 11/02/2018 0457   HGB 17.3 05/05/2020 0805   HCT 52.3 (H) 05/05/2020 0805   PLT 115 (L) 05/05/2020 0805   MCV 102 (H) 05/05/2020 0805   MCH 33.7 (H) 05/05/2020 0805   MCH 32.3 11/02/2018 0457   MCHC 33.1 05/05/2020 0805   MCHC 31.0 11/02/2018 0457   RDW 13.4 05/05/2020 0805   LYMPHSABS 1.5 05/05/2020 0805   MONOABS 1.2 (H) 10/25/2018 1000   EOSABS 0.2 05/05/2020 0805   BASOSABS 0.1 05/05/2020 0805   CMP     Component Value Date/Time   NA 143 05/05/2020 0805   K 4.5 05/05/2020 0805   CL 102 05/05/2020 0805   CO2 23 05/05/2020 0805   GLUCOSE 115 (H) 05/05/2020 0805   GLUCOSE 132 (H) 11/02/2018 0457   BUN 14 05/05/2020 0805   CREATININE 1.04 05/05/2020 0805   CREATININE 0.82 01/07/2013 0840   CALCIUM 9.9 05/05/2020 0805   PROT 7.8 05/05/2020 0805   ALBUMIN 4.2 05/05/2020 0805   AST 47 (H) 05/05/2020 0805   ALT 38 05/05/2020 0805   ALKPHOS 76 05/05/2020 0805   BILITOT 1.1 05/05/2020 0805   GFRNONAA 72 05/05/2020 0805   GFRNONAA >89 01/07/2013 0840   GFRAA 84 05/05/2020 0805   GFRAA >89 01/07/2013 0840  Lab Results  Component Value Date   INR 1.1 02/27/2019   INR 1.0 10/25/2018     MRI ABDOMEN WITHOUT AND WITH CONTRAST   TECHNIQUE: Multiplanar multisequence MR imaging of the abdomen was performed both before and after the administration of intravenous contrast.   CONTRAST:  59m GADAVIST GADOBUTROL 1 MMOL/ML IV SOLN   COMPARISON:  03/04/2019   FINDINGS: Lower chest: Unremarkable   Hepatobiliary: Hepatic morphology compatible with cirrhosis. No abnormal arterial phase enhancement or focal mass in the liver. Gallbladder unremarkable. No significant biliary dilatation.   Pancreas:  Unremarkable   Spleen:  Unremarkable   Adrenals/Urinary Tract: The adrenal glands appear normal. Bosniak category 1  cysts of the right kidney measuring up to 3.5 by 3.2 cm. A Bosniak category 2 cyst of the left mid kidney with internal septation measures 1.5 cm in diameter, but without abnormal nodularity or enhancement, this is considered benign.   Stomach/Bowel: Descending colon diverticulosis.   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 Aortoiliac atherosclerotic vascular disease. No pathologic adenopathy.   Other:  No supplemental non-categorized findings.   Musculoskeletal: Levoconvex lumbar scoliosis with spondylosis and degenerative disc disease.   IMPRESSION: 1. Hepatic cirrhosis without evidence of hepatocellular carcinoma. 2. 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 3. Descending colon diverticulosis. 4.  Aortic Atherosclerosis (ICD10-I70.0).     Electronically Signed   By: WVan ClinesM.D.   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  1.  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  There was no evidence for varices or portal gastropathy at upper endoscopy 1 year ago.  His disease remains very well compensated and is child Pugh A.  [facility]  He has not had jaundice, ascites or bleeding. --HCC screening up-to-date with MRI abdomen, repeat in 1 year with MRI abdomen --EGD negative for varices in August 2020, consider repeat in August 2022 --No evidence for hepatic encephalopathy, ascites or lower extremity edema  2.  History of gastric ulcer --treated with twice daily PPI and now once daily.  Biopsies of gastric ulcer were  benign and there is no H. pylori.  This is presumed to have healed.  3.  Fecal smearing --possibly related to internal hemorrhoids.  I recommend he begin Metamucil 1 heaping tablespoon 1-2 times daily.  If this is not helpful he should let me know.  4.  History of adenomatous colon polyps --surveillance colonoscopy recommended 3 years from last exam which would be August 2023  He can follow with me annually, sooner if needed  30 minutes total spent today including patient facing time, coordination of care, reviewing medical history/procedures/pertinent radiology studies, and documentation of the encounter.

## 2020-05-12 NOTE — Patient Instructions (Signed)
Please purchase the following medications over the counter and take as directed: Metamucil 1 tablespoon 1-2 times daily  Continue pantoprazole 40 mg daily  Please follow up with Dr Hilarie Fredrickson in 1 year.  If you are age 70 or older, your body mass index should be between 23-30. Your Body mass index is 34.74 kg/m. If this is out of the aforementioned range listed, please consider follow up with your Primary Care Provider.  If you are age 42 or younger, your body mass index should be between 19-25. Your Body mass index is 34.74 kg/m. If this is out of the aformentioned range listed, please consider follow up with your Primary Care Provider.   Due to recent changes in healthcare laws, you may see the results of your imaging and laboratory studies on MyChart before your provider has had a chance to review them.  We understand that in some cases there may be results that are confusing or concerning to you. Not all laboratory results come back in the same time frame and the provider may be waiting for multiple results in order to interpret others.  Please give [place] 48 hours in order for your provider to thoroughly review all the results before contacting the office for clarification of your results.

## 2020-05-13 ENCOUNTER — Encounter: Payer: Self-pay | Admitting: Family Medicine

## 2020-05-14 ENCOUNTER — Other Ambulatory Visit: Payer: Self-pay | Admitting: Family Medicine

## 2020-05-14 DIAGNOSIS — E1122 Type 2 diabetes mellitus with diabetic chronic kidney disease: Secondary | ICD-10-CM

## 2020-05-15 MED ORDER — GLUCAGON EMERGENCY 1 MG IJ KIT: 1.0000 mg | PACK | Freq: Once | INTRAMUSCULAR | 1 refills | Status: DC | PRN

## 2020-05-19 ENCOUNTER — Other Ambulatory Visit: Payer: Self-pay

## 2020-05-19 ENCOUNTER — Encounter: Payer: Self-pay | Admitting: Family Medicine

## 2020-05-19 ENCOUNTER — Ambulatory Visit (INDEPENDENT_AMBULATORY_CARE_PROVIDER_SITE_OTHER): Payer: BC Managed Care – PPO | Admitting: Family Medicine

## 2020-05-19 VITALS — BP 106/55 | HR 71 | Temp 98.2°F | Ht 70.0 in | Wt 245.0 lb

## 2020-05-19 DIAGNOSIS — E1122 Type 2 diabetes mellitus with diabetic chronic kidney disease: Secondary | ICD-10-CM

## 2020-05-19 DIAGNOSIS — E1169 Type 2 diabetes mellitus with other specified complication: Secondary | ICD-10-CM

## 2020-05-19 DIAGNOSIS — Z794 Long term (current) use of insulin: Secondary | ICD-10-CM

## 2020-05-19 DIAGNOSIS — E1159 Type 2 diabetes mellitus with other circulatory complications: Secondary | ICD-10-CM | POA: Diagnosis not present

## 2020-05-19 DIAGNOSIS — E162 Hypoglycemia, unspecified: Secondary | ICD-10-CM | POA: Diagnosis not present

## 2020-05-19 DIAGNOSIS — M545 Low back pain, unspecified: Secondary | ICD-10-CM

## 2020-05-19 DIAGNOSIS — D696 Thrombocytopenia, unspecified: Secondary | ICD-10-CM

## 2020-05-19 DIAGNOSIS — Z23 Encounter for immunization: Secondary | ICD-10-CM | POA: Diagnosis not present

## 2020-05-19 DIAGNOSIS — N182 Chronic kidney disease, stage 2 (mild): Secondary | ICD-10-CM

## 2020-05-19 DIAGNOSIS — E349 Endocrine disorder, unspecified: Secondary | ICD-10-CM

## 2020-05-19 DIAGNOSIS — I10 Essential (primary) hypertension: Secondary | ICD-10-CM

## 2020-05-19 DIAGNOSIS — Z79899 Other long term (current) drug therapy: Secondary | ICD-10-CM

## 2020-05-19 DIAGNOSIS — I152 Hypertension secondary to endocrine disorders: Secondary | ICD-10-CM

## 2020-05-19 DIAGNOSIS — E785 Hyperlipidemia, unspecified: Secondary | ICD-10-CM

## 2020-05-19 NOTE — Patient Instructions (Signed)
Heat, physical therapy for back pain.   Thrombocytopenia Thrombocytopenia means that you have a low number of platelets in your blood. Platelets are tiny cells in the blood. When you bleed, they clump together at the cut or injury to stop the bleeding. This is called blood clotting. If you do not have enough platelets, it can cause bleeding problems. Some cases of this condition are mild while others are more severe. What are the causes? This condition may be caused by:  Your body not making enough platelets. This may be caused by: ? Your bone marrow not making blood cells (aplastic anemia). ? Cancer in the bone marrow. ? Certain medicines. ? Infection in the bone marrow. ? Drinking a lot of alcohol.  Your body destroying platelets too quickly. This may be caused by: ? Certain immune diseases. ? Certain medicines. ? Certain blood clotting disorders. ? Certain disorders that are passed from parent to child (inherited). ? Certain bleeding disorders. ? Pregnancy. ? Having a spleen that is larger than normal. What are the signs or symptoms?  Bleeding that is not normal.  Nosebleeds.  Heavy menstrual periods.  Blood in the pee (urine) or poop (stool).  A purple-like color to the skin (purpura).  Bruising.  A rash that looks like pinpoint, purple-red spots (petechiae). How is this treated?  Treatment of another condition that is causing the low platelet count.  Medicines to help protect your platelets from being destroyed.  A replacement (transfusion) of platelets to stop or prevent bleeding.  Surgery to remove the spleen. Follow these instructions at home: Activity  Avoid activities that could cause you to get hurt or bruised. Follow instructions about how to prevent falls.  Take care not to cut yourself: ? When you shave. ? When you use scissors, needles, knives, or other tools.  Take care not to burn yourself: ? When you use an iron. ? When you cook. General  instructions   Check your skin and the inside of your mouth for bruises or blood as told by your doctor.  Check to see if there is blood in your spit (sputum), pee, and poop. Do this as told by your doctor.  Do not drink alcohol.  Take over-the-counter and prescription medicines only as told by your doctor.  Do not take any medicines that have aspirin or NSAIDs in them. These medicines can thin your blood and cause you to bleed.  Tell all of your doctors that you have this condition. Be sure to tell your dentist and eye doctor too. Contact a doctor if:  You have bruises and you do not know why. Get help right away if:  You are bleeding anywhere on your body.  You have blood in your spit, pee, or poop. Summary  Thrombocytopenia means that you have a low number of platelets in your blood.  Platelets are needed for blood clotting.  Symptoms of this condition include bleeding that is not normal, and bruising.  Take care not to cut or burn yourself. This information is not intended to replace advice given to you by your health care provider. Make sure you discuss any questions you have with your health care provider. Document Revised: 05/17/2018 Document Reviewed: 05/17/2018 Elsevier Patient Education  [place].

## 2020-05-19 NOTE — Progress Notes (Signed)
Subjective: CC: f/u type 2 diabetes, back pain PCP: [name], DO GBT:[id number] Tony Cantu is a 69 y.o. male presenting to clinic today for:  1. Type 2 Diabetes with hypertension and hyperlipidemia:  Patient reports compliance with Lipitor 40 mg daily, Diovan 160 mg daily, Toujeo and Humalog. Freestyle libre for blood sugar monitoring.  Sugar dropped in the last week to 40.  He had seizure like activity that woke his wife from sleep.  This has happened in the past. He dropped his Toujeo from 100 to 90 and BGs have been stable since.  He sometimes has to use a little more humalog after meals but over seems to be doing ok.  He has had intermittent back pain on the left since the event.  He is still mobile but notes it can be aggravating.    Last eye exam: Needs Last foot exam: UTD Last A1c:  Lab Results  Component Value Date   HGBA1C 5.1 05/05/2020   Nephropathy screen indicated?:  On ARB Last flu, zoster and/or pneumovax:  Immunization History  Administered Date(s) Administered   Fluad Quad(high Dose 65+) 05/17/2019   Influenza Split 06/05/2013   Influenza Whole 06/15/2010, 06/16/2011, 05/29/2012   Influenza, High Dose Seasonal PF 06/25/2015, 06/12/2017   Influenza,inj,Quad PF,6+ Mos 06/17/2014, 06/07/2016   Influenza-Unspecified 05/22/2018   Moderna SARS-COVID-2 Vaccination 11/07/2019, 12/05/2019   Pneumococcal Conjugate-13 06/05/2013   Pneumococcal Polysaccharide-23 06/03/2011, 09/16/2019   Tdap 08/30/2007, 12/27/2017   2.  Thrombocytopenia Never has been worked up by hematology.  Has known cirrhosis of the liver which is followed by his specialist.  Denies prolonged or spontaneous bleeding.   ROS: Per HPI  Allergies  Allergen Reactions   Erythromycin Diarrhea   Sulfonamide Derivatives Diarrhea   Past Medical History:  Diagnosis Date   Arthritis    Asthma    BPH (benign prostatic hypertrophy)    Cirrhosis (HCC)    Colon polyps    Diabetes  mellitus without complication (HCC)    TYPE 1    Diverticulosis    Gastric ulcer    Gastric varices    right   Gastritis    GERD (gastroesophageal reflux disease)    Headache    HX   Hypertension    PONV (postoperative nausea and vomiting)    Renal cyst, right    Seizures (HCC)    HYPOGLYCEMIC LAST 1 AND 1/2 YRS AGO   Sleep apnea    uses CPAP nightly   Testicle trouble    one testicle BORN WITH   Tubular adenoma of colon     Current Outpatient Medications:    albuterol (VENTOLIN HFA) 108 (90 Base) MCG/ACT inhaler, Inhale 2 puffs into the lungs every 6 (six) hours as needed for wheezing or shortness of breath., Disp: 6.7 g, Rfl: 0   aspirin 81 MG chewable tablet, Chew by mouth daily., Disp: , Rfl:    atorvastatin (LIPITOR) 40 MG tablet, TAKE 1 TABLET BY MOUTH  DAILY, Disp: 90 tablet, Rfl: 3   b complex vitamins tablet, Take 1 tablet by mouth daily at 12 noon. , Disp: , Rfl:    Calcium Carbonate-Vitamin D 600-400 MG-UNIT tablet, Take 1 tablet by mouth daily., Disp: , Rfl:    Cholecalciferol (VITAMIN D3) 2000 UNITS capsule, Take 2,000 Units by mouth daily at 12 noon. , Disp: , Rfl:    Continuous Blood Gluc Receiver (FREESTYLE LIBRE READER) DEVI, 1 applicator by Does not apply route as directed., Disp: 1 Device, Rfl: 1  Continuous Blood Gluc Sensor (FREESTYLE LIBRE SENSOR SYSTEM) MISC, Check BS eight (8) times a day. Dx E10.9, Disp: 3 each, Rfl: 3   fexofenadine (ALLEGRA) 180 MG tablet, Take 1 tablet (180 mg total) by mouth daily with breakfast., Disp: 90 tablet, Rfl: 3   furosemide (LASIX) 20 MG tablet, TAKE 1 AND 1/2 TABLETS BY  MOUTH DAILY, Disp: 135 tablet, Rfl: 3   Glucagon, rDNA, (GLUCAGON EMERGENCY) 1 MG KIT, Inject 1 mg into the skin Once PRN for up to 1 dose., Disp: 3 kit, Rfl: 1   Glucosamine-Chondroit-Vit C-Mn (GLUCOSAMINE 1500 COMPLEX) CAPS, Take 2 capsules by mouth daily at 12 noon. , Disp: , Rfl:    Insulin Glargine, 2 Unit Dial, (TOUJEO MAX  SOLOSTAR) 300 UNIT/ML SOPN, Inject 110 Units into the skin daily., Disp: 45 mL, Rfl: 3   insulin lispro (HUMALOG) 100 UNIT/ML injection, INJECT SUBCUTANEOUSLY 20 TO 50 UNITS 3 TIMES DAILY  BEFORE MEALS PER SLIDING  SCALE, Disp: 140 mL, Rfl: 3   montelukast (SINGULAIR) 10 MG tablet, TAKE 1 TABLET BY MOUTH AT  BEDTIME, Disp: 90 tablet, Rfl: 3   multivitamin-lutein (OCUVITE-LUTEIN) CAPS capsule, Take 1 capsule by mouth daily at 12 noon., Disp: , Rfl:    pantoprazole (PROTONIX) 40 MG tablet, Take 1 tablet (40 mg total) by mouth daily., Disp: 90 tablet, Rfl: 1   sildenafil (REVATIO) 20 MG tablet, TAKE 2-5 TABLETS AS NEEDED PRIOR TO SEXUAL ACTIVITY, Disp: 50 tablet, Rfl: 1   Testosterone 30 MG/ACT SOLN, APPLY ONE PUMP TOPICALLY  UNDER EACH AXILLA DAILY  (TOTAL OF 60MG DAILY ), Disp: 270 mL, Rfl: 1   valsartan (DIOVAN) 160 MG tablet, TAKE 1 TABLET BY MOUTH  DAILY, Disp: 90 tablet, Rfl: 3 Social History   Socioeconomic History   Marital status: Married    Spouse name: Not on file   Number of children: Not on file   Years of education: Not on file   Highest education level: Not on file  Occupational History   Occupation: retired   Tobacco Use   Smoking status: Former Smoker    Years: 34.00    Types: Cigarettes, Pipe   Smokeless tobacco: Never Used   Tobacco comment: quit 2005 smoked cigarettes for 5 yrs prior to pipe use  Vaping Use   Vaping Use: Never used  Substance and Sexual Activity   Alcohol use: No    Comment: once a year   Drug use: No   Sexual activity: Yes  Other Topics Concern   Not on file  Social History Narrative   Not on file   Social Determinants of Health   Financial Resource Strain:    Difficulty of Paying Living Expenses: Not on file  Food Insecurity:    Worried About Charity fundraiser in the Last Year: Not on file   [employer] of Food in the Last Year: Not on file  Transportation Needs:    Lack of Transportation (Medical): Not on file    Lack of Transportation (Non-Medical): Not on file  Physical Activity:    Days of Exercise per Week: Not on file   Minutes of Exercise per Session: Not on file  Stress:    Feeling of Stress : Not on file  Social Connections:    Frequency of Communication with Friends and Family: Not on file   Frequency of Social Gatherings with Friends and Family: Not on file   Attends Religious Services: Not on file   Active Member of Clubs or  Organizations: Not on file   Attends Archivist Meetings: Not on file   Marital Status: Not on file  Intimate Partner Violence:    Fear of Current or Ex-Partner: Not on file   Emotionally Abused: Not on file   Physically Abused: Not on file   Sexually Abused: Not on file   Family History  Problem Relation Age of Onset   Heart failure Mother    Heart attack Mother        in her 61's   Alzheimer's disease Mother    Diabetes Mother    Asthma Father    Suicidality Father        in his 66's   Allergic rhinitis Sister    Heart failure Maternal Grandmother    Rheum arthritis Maternal Grandmother    Breast cancer Maternal Grandmother    Heart attack Maternal Grandmother        in her 77's   Colon cancer Neg Hx    Esophageal cancer Neg Hx    Pancreatic cancer Neg Hx    Liver disease Neg Hx     Objective: Office vital signs reviewed. BP (!) 106/55    Pulse 71    Temp 98.2 F (36.8 C)    Ht 5' 10"  (1.778 m)    Wt 245 lb (111.1 kg)    SpO2 96%    BMI 35.15 kg/m   Physical Examination:  General: Awake, alert, well nourished, No acute distress HEENT: Normal. Sclera white, MMM Cardio: regular rate and rhythm, S1S2 heard, 2/6 SEM appreciated at LSB Pulm: clear to auscultation bilaterally, no wheezes, rhonchi or rales; normal work of breathing on room air Extremities: warm, well perfused, trace ankle edema on right. No cyanosis or clubbing; +1 pulses bilaterally MSK: normal gait and station; ambulating  independently Skin: dry; intact; no rashes or lesions Psych: Mood stable, speech normal, eye contact good, patient is pleasant and interactive  Depression screen [facility] 2/9 02/07/2020 01/15/2020 09/16/2019  Decreased Interest 0 1 0  Down, Depressed, Hopeless 0 - 0  PHQ - 2 Score 0 1 0  Altered sleeping - 0 -  Tired, decreased energy - 0 -  Change in appetite - 0 -  Feeling bad or failure about yourself  - 0 -  Trouble concentrating - 0 -  Moving slowly or fidgety/restless - 0 -  Suicidal thoughts - 0 -  PHQ-9 Score - 1 -  Some recent data might be hidden   No flowsheet data found.  Assessment/ Plan: 69 y.o. male   1. Controlled type 2 diabetes mellitus with stage 2 chronic kidney disease, with long-term current use of insulin ([place]) Agree that he is being too aggressive with toujeo.  We discussed the high risk of hypoglycemic episodes.  We also discussed trying to keep goal blood sugars closer to an average of A1c of 6.0.  I think he is too tightly controlled right now and often having hypoglycemic episodes which are averaging with his hyperglycemia as a normal blood sugar.  2. Hypoglycemia  3. Acute left-sided low back pain without sciatica Heat, home physical therapy stretches provided  4. Hypertension associated with type 2 diabetes mellitus ([place]) Controlled.  May need to consider backing down on Diovan as he is borderline on the low side.  5. Hyperlipidemia associated with type 2 diabetes mellitus ([place]) Continue current regimen  6. Testosterone deficiency Testosterone within goal.  UDS and CSC were obtained as per office policy - ToxASSURE [place] 13 (MW),  Urine  7. Controlled substance agreement signed ToxASSURE Select 13 (MW), Urine  8. Thrombocytopenia ([place]) Not having any bleeding abnormalities.  Does not desire referral to hematology.  Thrombocytopenia likely explained by cirrhosis of the liver.   No orders of the defined types were placed in this encounter.  No  orders of the defined types were placed in this encounter.    [name], DO [place] [phone]

## 2020-05-25 DIAGNOSIS — E108 Type 1 diabetes mellitus with unspecified complications: Secondary | ICD-10-CM | POA: Diagnosis not present

## 2020-05-25 DIAGNOSIS — Z794 Long term (current) use of insulin: Secondary | ICD-10-CM | POA: Diagnosis not present

## 2020-05-27 ENCOUNTER — Other Ambulatory Visit: Payer: Self-pay

## 2020-05-27 DIAGNOSIS — K219 Gastro-esophageal reflux disease without esophagitis: Secondary | ICD-10-CM

## 2020-05-27 DIAGNOSIS — G4733 Obstructive sleep apnea (adult) (pediatric): Secondary | ICD-10-CM | POA: Diagnosis not present

## 2020-05-27 MED ORDER — PANTOPRAZOLE SODIUM 40 MG PO TBEC: 40.0000 mg | DELAYED_RELEASE_TABLET | Freq: Every day | ORAL | 1 refills | Status: DC

## 2020-05-31 ENCOUNTER — Encounter: Payer: Self-pay | Admitting: Family Medicine

## 2020-06-01 ENCOUNTER — Encounter: Payer: Self-pay | Admitting: Family Medicine

## 2020-06-01 MED ORDER — ONETOUCH ULTRA VI STRP: ORAL_STRIP | 12 refills | Status: DC

## 2020-06-03 DIAGNOSIS — E108 Type 1 diabetes mellitus with unspecified complications: Secondary | ICD-10-CM | POA: Diagnosis not present

## 2020-06-04 ENCOUNTER — Encounter: Payer: Self-pay | Admitting: Family Medicine

## 2020-06-04 ENCOUNTER — Other Ambulatory Visit: Payer: Self-pay | Admitting: *Deleted

## 2020-06-04 MED ORDER — CONTOUR NEXT ONE KIT: PACK | 3 refills | Status: DC

## 2020-06-04 MED ORDER — MICROLET LANCETS MISC: 3 refills | Status: DC

## 2020-06-10 DIAGNOSIS — E119 Type 2 diabetes mellitus without complications: Secondary | ICD-10-CM | POA: Diagnosis not present

## 2020-06-22 DIAGNOSIS — I480 Paroxysmal atrial fibrillation: Secondary | ICD-10-CM | POA: Insufficient documentation

## 2020-06-22 NOTE — Progress Notes (Signed)
`      Cardiology Office Note   Date:  06/24/2020   ID:  Tony Cantu, DOB 05/24/1951, MRN [id number]  PCP:  Tony Norlander, DO  Cardiologist:   Tony Breeding, MD  Referring:  Tony Norlander, DO  Chief Complaint  Patient presents with  . Aortic Stenosis      History of Present Illness: Tony Cantu is a 69 y.o. male who presents for evaluation of arrhythmia.   He was previously seen by Dr. Aundra Cantu.  He had a cardiolite in 2009 that was a normal study. He uses CPAP for his sleep apnea. He has mild carotid disease.  The patient was admitted previously  with sepsis.  He had new onset atrial fib with RVR.  He was treated with Eliquis. Marland Kitchen  However, I stopped this as this appeared to be an isolated event.   Since I last saw him he has slowed down because of some joint pain.  He still does some minimal activities of daily living but he does not sound overly active.  He has some chronic dyspnea on exertion and mild cough but this is not changed.  He denies any chest pressure, neck or arm discomfort.  He has occasional palpitations but these are very brief and does not think he has any sustained tachycardia or other arrhythmias.   Past Medical History:  Diagnosis Date  . Arthritis   . Asthma   . BPH (benign prostatic hypertrophy)   . Cirrhosis ([place])   . Colon polyps   . Diabetes mellitus without complication (HCC)    TYPE 1   . Diverticulosis   . Gastric ulcer   . Gastric varices    right  . Gastritis   . GERD (gastroesophageal reflux disease)   . Headache    HX  . Hypertension   . PONV (postoperative nausea and vomiting)   . Renal cyst, right   . Seizures (HCC)    HYPOGLYCEMIC LAST 1 AND 1/2 YRS AGO  . Sleep apnea    uses CPAP nightly  . Testicle trouble    one testicle BORN WITH  . Tubular adenoma of colon     Past Surgical History:  Procedure Laterality Date  . BACK SURGERY     89  LOWER   . CARPAL TUNNEL RELEASE Right 10/13/2015   Procedure: RIGHT  CARPAL TUNNEL RELEASE;  Surgeon: Daryll Brod, MD;  Location: [place];  Service: Orthopedics;  Laterality: Right;  . CARPAL TUNNEL RELEASE Left 07/05/2016   Procedure: LEFT CARPAL TUNNEL RELEASE;  Surgeon: Daryll Brod, MD;  Location: [place] [facility];  Service: Orthopedics;  Laterality: Left;  . HERNIA REPAIR    . INGUINAL HERNIA REPAIR  2003   right   . KNEE ARTHROSCOPY Right 03/03/2016  . LUMBAR DISC SURGERY  8/96   Dr. Coralyn Mark, discectomy  . LUMBAR LAMINECTOMY/DECOMPRESSION MICRODISCECTOMY Right 10/06/2016   Procedure: RIGHT LUMBAR THREE - LUMBAR FOUR  LAMINECTOMY, FORAMINOTOMY AND MICRODISCECTOMY;  Surgeon: Jovita Gamma, MD;  Location: [place];  Service: Neurosurgery;  Laterality: Right;  . SHOULDER SURGERY  11/28/05   left partial  . SHOULDER SURGERY  07/14/2006   RIGHT  . TONSILLECTOMY  AGE 35 OR 4  . TOTAL SHOULDER ARTHROPLASTY Right 11/01/2018   Procedure: RIGHT SHOULDER REVISION TO REVERSE TOTAL SHOULDER;  Surgeon: Tania Ade, MD;  Location: [facility];  Service: Orthopedics;  Laterality: Right;  CHOICE ANESTHESIA WITH INTERSCALENE BLOCK EXPAREL, NEEDS RNFA  Current Outpatient Medications  Medication Sig Dispense Refill  . aspirin 81 MG chewable tablet Chew by mouth daily.    Marland Kitchen atorvastatin (LIPITOR) 40 MG tablet TAKE 1 TABLET BY MOUTH  DAILY 90 tablet 3  . b complex vitamins tablet Take 1 tablet by mouth daily at 12 noon.     . Calcium Carbonate-Vitamin D 600-400 MG-UNIT tablet Take 1 tablet by mouth daily.    . Cholecalciferol (VITAMIN D3) 2000 UNITS capsule Take 2,000 Units by mouth daily at 12 noon.     . fexofenadine (ALLEGRA) 180 MG tablet Take 1 tablet (180 mg total) by mouth daily with breakfast. 90 tablet 3  . furosemide (LASIX) 20 MG tablet TAKE 1 AND 1/2 TABLETS BY  MOUTH DAILY 135 tablet 3  . Glucosamine-Chondroit-Vit C-Mn (GLUCOSAMINE 1500 COMPLEX) CAPS Take 2 capsules by mouth daily at 12 noon.     . Insulin Glargine, 2 Unit Dial, (TOUJEO  MAX SOLOSTAR) 300 UNIT/ML SOPN Inject 110 Units into the skin daily. 45 mL 3  . insulin lispro (HUMALOG) 100 UNIT/ML injection INJECT SUBCUTANEOUSLY 20 TO 50 UNITS 3 TIMES DAILY  BEFORE MEALS PER SLIDING  SCALE 140 mL 3  . montelukast (SINGULAIR) 10 MG tablet TAKE 1 TABLET BY MOUTH AT  BEDTIME 90 tablet 3  . multivitamin-lutein (OCUVITE-LUTEIN) CAPS capsule Take 1 capsule by mouth daily at 12 noon.    . pantoprazole (PROTONIX) 40 MG tablet Take 1 tablet (40 mg total) by mouth daily. 90 tablet 1  . sildenafil (REVATIO) 20 MG tablet TAKE 2-5 TABLETS AS NEEDED PRIOR TO SEXUAL ACTIVITY 50 tablet 1  . Testosterone 30 MG/ACT SOLN APPLY ONE PUMP TOPICALLY  UNDER EACH AXILLA DAILY  (TOTAL OF 60MG DAILY ) 270 mL 1  . valsartan (DIOVAN) 160 MG tablet TAKE 1 TABLET BY MOUTH  DAILY 90 tablet 3  . albuterol (VENTOLIN HFA) 108 (90 Base) MCG/ACT inhaler Inhale 2 puffs into the lungs every 6 (six) hours as needed for wheezing or shortness of breath. (Patient not taking: Reported on 05/19/2020) 6.7 g 0  . Blood Glucose Monitoring Suppl (CONTOUR NEXT ONE) KIT Test BS QID and as needed Dx E11.9 500 kit 3  . Continuous Blood Gluc Receiver (FREESTYLE LIBRE READER) DEVI 1 applicator by Does not apply route as directed. 1 Device 1  . Continuous Blood Gluc Sensor (FREESTYLE LIBRE SENSOR SYSTEM) MISC Check BS eight (8) times a day. Dx E10.9 3 each 3  . Glucagon, rDNA, (GLUCAGON EMERGENCY) 1 MG KIT Inject 1 mg into the skin Once PRN for up to 1 dose. 3 kit 1  . glucose blood (ONETOUCH ULTRA) test strip Use as needed E11.9 200 each 12  . Microlet Lancets MISC Test BS QID and as needed Dx E11.9 500 each 3   No current facility-administered medications for this visit.    Allergies:   Erythromycin and Sulfonamide derivatives    ROS:  Please see the history of present illness.   Otherwise, review of systems are positive for none.   All other systems are reviewed and negative.    PHYSICAL EXAM: VS:  BP 122/74   Pulse 61    Ht 5' 10"  (1.778 m)   Wt 241 lb (109.3 kg)   BMI 34.58 kg/m  , BMI Body mass index is 34.58 kg/m.  GENERAL:  Well appearing NECK:  No jugular venous distention, waveform within normal limits, carotid upstroke brisk and symmetric, no bruits, no thyromegaly LUNGS:  Clear to auscultation bilaterally CHEST:  Unremarkable HEART:  PMI not displaced or sustained,S1 and S2 within normal limits, no S3, no S4, no clicks, no rubs, 3 out of 6 apical systolic murmur radiating at aortic outflow tract and early peaking, no diastolic murmurs ABD:  Flat, positive bowel sounds normal in frequency in pitch, no bruits, no rebound, no guarding, no midline pulsatile mass, no hepatomegaly, no splenomegaly EXT:  2 plus pulses throughout, no edema, no cyanosis no clubbing   EKG:  EKG is  ordered today. Sinus rhythm with sinus arrhythmia, rate 61, right bundle branch block, no acute ST-T wave changes, no change from previous.  Recent Labs: 09/10/2019: TSH 1.340 05/05/2020: ALT 38; BUN 14; Creatinine, Ser 1.04; Hemoglobin 17.3; Platelets 115; Potassium 4.5; Sodium 143    Lipid Panel    Component Value Date/Time   CHOL 132 05/05/2020 0805   CHOL 107 01/07/2013 0840   TRIG 73 05/05/2020 0805   TRIG 59 03/14/2017 0802   TRIG 47 01/07/2013 0840   HDL 55 05/05/2020 0805   HDL 49 03/14/2017 0802   HDL 53 01/07/2013 0840   CHOLHDL 2.4 05/05/2020 0805   LDLCALC 62 05/05/2020 0805   LDLCALC 44 01/03/2014 0803   LDLCALC 45 01/07/2013 0840     Lab Results  Component Value Date   HGBA1C 5.1 05/05/2020    Wt Readings from Last 3 Encounters:  06/24/20 241 lb (109.3 kg)  05/19/20 245 lb (111.1 kg)  05/12/20 242 lb 2 oz (109.8 kg)      Other studies Reviewed: Additional studies/ records that were reviewed today include: Labs Review of the above records demonstrates:   See elsewhere   ASSESSMENT AND PLAN:   Aortic stenosis:    This is mild but I have not imaged this since 2017.  I will check an  echocardiogram.  We talked about the symptoms he could develop should this become more severe.   Hyperlipidemia:   LDL was 62.  No change in therapy.  Questionable h/o Afib:     Is had no documented atrial fibrillation.  His only questionable atrial fibrillation was when he was septic and this appeared more to be multifocal atrial tachycardia.  We stopped anticoagulation at the last visit and I see no indication to resume .  OSA: on CPAP: He continues to use CPAP.   Carotid stenosis:   This was mild last year.  No change in therapy or further testing at this time.    Current medicines are reviewed at length with the patient today.  The patient does not have concerns regarding medicines.  The following changes have been made: None  Labs/ tests ordered today include:    Orders Placed This Encounter  Procedures  . EKG 12-Lead  . ECHOCARDIOGRAM COMPLETE     Disposition:   FU with me in 18 months.      Signed, Tony Breeding, MD  06/24/2020 9:51 AM    [place]

## 2020-06-24 ENCOUNTER — Encounter: Payer: Self-pay | Admitting: Cardiology

## 2020-06-24 ENCOUNTER — Ambulatory Visit: Payer: BC Managed Care – PPO | Admitting: Cardiology

## 2020-06-24 ENCOUNTER — Other Ambulatory Visit: Payer: Self-pay

## 2020-06-24 VITALS — BP 122/74 | HR 61 | Ht 70.0 in | Wt 241.0 lb

## 2020-06-24 DIAGNOSIS — E785 Hyperlipidemia, unspecified: Secondary | ICD-10-CM | POA: Diagnosis not present

## 2020-06-24 DIAGNOSIS — Z9989 Dependence on other enabling machines and devices: Secondary | ICD-10-CM

## 2020-06-24 DIAGNOSIS — I480 Paroxysmal atrial fibrillation: Secondary | ICD-10-CM | POA: Diagnosis not present

## 2020-06-24 DIAGNOSIS — G4733 Obstructive sleep apnea (adult) (pediatric): Secondary | ICD-10-CM

## 2020-06-24 DIAGNOSIS — I350 Nonrheumatic aortic (valve) stenosis: Secondary | ICD-10-CM

## 2020-06-24 DIAGNOSIS — E108 Type 1 diabetes mellitus with unspecified complications: Secondary | ICD-10-CM | POA: Diagnosis not present

## 2020-06-24 DIAGNOSIS — Z794 Long term (current) use of insulin: Secondary | ICD-10-CM | POA: Diagnosis not present

## 2020-06-24 NOTE — Patient Instructions (Signed)
Medication Instructions:  The current medical regimen is effective;  continue present plan and medications.  *If you need a refill on your cardiac medications before your next appointment, please call your pharmacy*  Testing/Procedures: Your physician has requested that you have an echocardiogram. Echocardiography is a painless test that uses sound waves to create images of your heart. It provides your doctor with information about the size and shape of your heart and how well your heart's chambers and valves are working. This procedure takes approximately one hour. There are no restrictions for this procedure.  Please check in at the main entrance.  Follow-Up: At [facility], you and your health needs are our priority.  As part of our continuing mission to provide you with exceptional heart care, we have created designated Provider Care Teams.  These Care Teams include your primary Cardiologist (physician) and Advanced Practice Providers (APPs -  Physician Assistants and Nurse Practitioners) who all work together to provide you with the care you need, when you need it.  We recommend signing up for the patient portal called "MyChart".  Sign up information is provided on this After Visit Summary.  MyChart is used to connect with patients for Virtual Visits (Telemedicine).  Patients are able to view lab/test results, encounter notes, upcoming appointments, etc.  Non-urgent messages can be sent to your provider as well.   To learn more about what you can do with MyChart, go to [email or web address].    Your next appointment:  18 month(s)  The format for your next appointment:   In Person  Provider:   Minus Breeding, MD   Thank you for choosing [facility]!!

## 2020-07-02 ENCOUNTER — Ambulatory Visit (HOSPITAL_COMMUNITY)
Admission: RE | Admit: 2020-07-02 | Discharge: 2020-07-02 | Disposition: A | Discharge status: Home / Self Care | Payer: BC Managed Care – PPO | Source: Ambulatory Visit | Attending: Cardiology | Admitting: Cardiology

## 2020-07-02 ENCOUNTER — Other Ambulatory Visit: Payer: Self-pay

## 2020-07-02 DIAGNOSIS — I350 Nonrheumatic aortic (valve) stenosis: Secondary | ICD-10-CM

## 2020-07-02 LAB — ECHOCARDIOGRAM COMPLETE
AR max vel: 1.65 cm2
AV Area VTI: 1.49 cm2
AV Area mean vel: 1.48 cm2
AV Mean grad: 20.7 mmHg
AV Peak grad: 38.4 mmHg
Ao pk vel: 3.1 m/s
Area-P 1/2: 2.91 cm2
S' Lateral: 3.69 cm

## 2020-07-02 NOTE — Progress Notes (Signed)
*  PRELIMINARY RESULTS* Echocardiogram 2D Echocardiogram has been performed.  Samuel Germany 07/02/2020, 12:21 PM

## 2020-07-25 DIAGNOSIS — E108 Type 1 diabetes mellitus with unspecified complications: Secondary | ICD-10-CM | POA: Diagnosis not present

## 2020-07-25 DIAGNOSIS — Z794 Long term (current) use of insulin: Secondary | ICD-10-CM | POA: Diagnosis not present

## 2020-08-06 ENCOUNTER — Encounter: Payer: Self-pay | Admitting: Family Medicine

## 2020-08-06 ENCOUNTER — Ambulatory Visit (INDEPENDENT_AMBULATORY_CARE_PROVIDER_SITE_OTHER): Payer: BC Managed Care – PPO | Admitting: Family Medicine

## 2020-08-06 DIAGNOSIS — J329 Chronic sinusitis, unspecified: Secondary | ICD-10-CM

## 2020-08-06 DIAGNOSIS — J40 Bronchitis, not specified as acute or chronic: Secondary | ICD-10-CM

## 2020-08-06 MED ORDER — AMOXICILLIN-POT CLAVULANATE 875-125 MG PO TABS: 1.0000 | ORAL_TABLET | Freq: Two times a day (BID) | ORAL | 0 refills | Status: DC

## 2020-08-06 MED ORDER — BENZONATATE 200 MG PO CAPS
200.0000 mg | ORAL_CAPSULE | Freq: Three times a day (TID) | ORAL | 0 refills | Status: DC | PRN
Start: 2020-08-06 — End: 2021-04-22

## 2020-08-06 NOTE — Progress Notes (Signed)
Subjective:    Patient ID: Tony Cantu, male    DOB: [birth year], 69 y.o.   MRN: [id number]   HPI: Tony Cantu is a 69 y.o. male presenting for face flushing 3 days ago with fever to 100.6. up and down since. Nasal congestion. PND. No Chest pain. No loss of taste or smell. Malaise. Coughing at night. Not continuous. (Former smoker.) History of head cold turning into bronchitis. No dyspnea.    Depression screen [facility] 2/9 05/19/2020 02/07/2020 01/15/2020 09/16/2019 05/17/2019  Decreased Interest 0 0 1 0 0  Down, Depressed, Hopeless 0 0 - 0 0  PHQ - 2 Score 0 0 1 0 0  Altered sleeping 0 - 0 - -  Tired, decreased energy 0 - 0 - -  Change in appetite 0 - 0 - -  Feeling bad or failure about yourself  0 - 0 - -  Trouble concentrating 0 - 0 - -  Moving slowly or fidgety/restless 0 - 0 - -  Suicidal thoughts 0 - 0 - -  PHQ-9 Score 0 - 1 - -  Some recent data might be hidden     Relevant past medical, surgical, family and social history reviewed and updated as indicated.  Interim medical history since our last visit reviewed. Allergies and medications reviewed and updated.  ROS:  Review of Systems  Constitutional: Negative for activity change, appetite change, chills and fever.  HENT: Positive for congestion, postnasal drip, rhinorrhea and sinus pressure. Negative for ear discharge, ear pain, hearing loss, nosebleeds, sneezing and trouble swallowing.   Respiratory: Positive for cough and shortness of breath (mild). Negative for chest tightness.   Cardiovascular: Negative for chest pain and palpitations.  Skin: Negative for rash.     Social History   Tobacco Use  Smoking Status Former Smoker  . Years: 34.00  . Types: Cigarettes, Pipe  Smokeless Tobacco Never Used  Tobacco Comment   quit 2005 smoked cigarettes for 5 yrs prior to pipe use       Objective:     Wt Readings from Last 3 Encounters:  06/24/20 241 lb (109.3 kg)  05/19/20 245 lb (111.1 kg)  05/12/20 242 lb 2 oz  (109.8 kg)     Exam deferred. Pt. Harboring due to COVID 19. Phone visit performed.   Assessment & Plan:   1. Sinobronchitis     Meds ordered this encounter  Medications  . amoxicillin-clavulanate (AUGMENTIN) 875-125 MG tablet    Sig: Take 1 tablet by mouth 2 (two) times daily. Take all of this medication    Dispense:  20 tablet    Refill:  0  . benzonatate (TESSALON) 200 MG capsule    Sig: Take 1 capsule (200 mg total) by mouth 3 (three) times daily as needed for cough.    Dispense:  20 capsule    Refill:  0      Diagnoses and all orders for this visit:  Sinobronchitis  Other orders -     amoxicillin-clavulanate (AUGMENTIN) 875-125 MG tablet; Take 1 tablet by mouth 2 (two) times daily. Take all of this medication -     benzonatate (TESSALON) 200 MG capsule; Take 1 capsule (200 mg total) by mouth 3 (three) times daily as needed for cough.    Virtual Visit via telephone Note  I discussed the limitations, risks, security and privacy concerns of performing an evaluation and management service by telephone and the availability of in person appointments. The patient was identified with  two identifiers. Pt.expressed understanding and agreed to proceed. Pt. Is at home. Dr. Livia Snellen is in his office.  Follow Up Instructions:   I discussed the assessment and treatment plan with the patient. The patient was provided an opportunity to ask questions and all were answered. The patient agreed with the plan and demonstrated an understanding of the instructions.   The patient was advised to call back or seek an in-person evaluation if the symptoms worsen or if the condition fails to improve as anticipated.   Total minutes including chart review and phone contact time: 12   Follow up plan: No follow-ups on file.  Claretta Fraise, MD [place]

## 2020-08-24 ENCOUNTER — Other Ambulatory Visit: Payer: Self-pay | Admitting: *Deleted

## 2020-08-24 DIAGNOSIS — J301 Allergic rhinitis due to pollen: Secondary | ICD-10-CM

## 2020-08-24 MED ORDER — ATORVASTATIN CALCIUM 40 MG PO TABS: 40.0000 mg | ORAL_TABLET | Freq: Every day | ORAL | 0 refills | Status: DC

## 2020-08-24 MED ORDER — VALSARTAN 160 MG PO TABS: 160.0000 mg | ORAL_TABLET | Freq: Every day | ORAL | 0 refills | Status: DC

## 2020-08-24 MED ORDER — MONTELUKAST SODIUM 10 MG PO TABS: 10.0000 mg | ORAL_TABLET | Freq: Every day | ORAL | 0 refills | Status: DC

## 2020-08-26 ENCOUNTER — Ambulatory Visit (INDEPENDENT_AMBULATORY_CARE_PROVIDER_SITE_OTHER): Payer: BC Managed Care – PPO

## 2020-08-26 ENCOUNTER — Other Ambulatory Visit: Payer: Self-pay

## 2020-08-26 DIAGNOSIS — E108 Type 1 diabetes mellitus with unspecified complications: Secondary | ICD-10-CM | POA: Diagnosis not present

## 2020-08-26 DIAGNOSIS — Z23 Encounter for immunization: Secondary | ICD-10-CM | POA: Diagnosis not present

## 2020-08-26 DIAGNOSIS — Z794 Long term (current) use of insulin: Secondary | ICD-10-CM | POA: Diagnosis not present

## 2020-08-26 NOTE — Progress Notes (Signed)
   Covid-19 Vaccination Clinic  Name:  Tony Cantu    MRN: [id number] DOB: 23-Mar-1951  08/26/2020  Mr. Tony Cantu was observed post Covid-19 immunization for 15 minutes without incident. He was provided with Vaccine Information Sheet and instruction to access the V-Safe system.   Mr. Tony Cantu was instructed to call 911 with any severe reactions post vaccine: Marland Kitchen Difficulty breathing  . Swelling of face and throat  . A fast heartbeat  . A bad rash all over body  . Dizziness and weakness   Immunizations Administered    Name Date Dose VIS Date Route   Moderna Covid-19 Booster Vaccine 08/26/2020 11:50 AM 0.25 mL 06/17/2020 Intramuscular   Manufacturer: Levan Hurst   Lot: [id number]   [place]: [phone]

## 2020-09-01 ENCOUNTER — Other Ambulatory Visit: Payer: Self-pay | Admitting: Family Medicine

## 2020-09-01 ENCOUNTER — Encounter: Payer: Self-pay | Admitting: Family Medicine

## 2020-09-01 ENCOUNTER — Other Ambulatory Visit: Payer: Self-pay | Admitting: *Deleted

## 2020-09-01 DIAGNOSIS — E1122 Type 2 diabetes mellitus with diabetic chronic kidney disease: Secondary | ICD-10-CM

## 2020-09-01 DIAGNOSIS — Z794 Long term (current) use of insulin: Secondary | ICD-10-CM

## 2020-09-01 DIAGNOSIS — M5416 Radiculopathy, lumbar region: Secondary | ICD-10-CM | POA: Diagnosis not present

## 2020-09-01 DIAGNOSIS — R600 Localized edema: Secondary | ICD-10-CM

## 2020-09-01 DIAGNOSIS — E349 Endocrine disorder, unspecified: Secondary | ICD-10-CM

## 2020-09-01 MED ORDER — FUROSEMIDE 20 MG PO TABS: 30.0000 mg | ORAL_TABLET | Freq: Every day | ORAL | 0 refills | Status: DC

## 2020-09-04 ENCOUNTER — Other Ambulatory Visit: Payer: BC Managed Care – PPO

## 2020-09-04 ENCOUNTER — Other Ambulatory Visit: Payer: Self-pay

## 2020-09-07 ENCOUNTER — Other Ambulatory Visit: Payer: Self-pay | Admitting: Family Medicine

## 2020-09-07 ENCOUNTER — Other Ambulatory Visit: Payer: Medicare Other

## 2020-09-07 ENCOUNTER — Other Ambulatory Visit: Payer: Self-pay

## 2020-09-07 DIAGNOSIS — E349 Endocrine disorder, unspecified: Secondary | ICD-10-CM | POA: Diagnosis not present

## 2020-09-07 DIAGNOSIS — Z794 Long term (current) use of insulin: Secondary | ICD-10-CM | POA: Diagnosis not present

## 2020-09-07 DIAGNOSIS — N182 Chronic kidney disease, stage 2 (mild): Secondary | ICD-10-CM

## 2020-09-07 DIAGNOSIS — E1122 Type 2 diabetes mellitus with diabetic chronic kidney disease: Secondary | ICD-10-CM | POA: Diagnosis not present

## 2020-09-07 LAB — BAYER DCA HB A1C WAIVED: HB A1C (BAYER DCA - WAIVED): 4.7 % (ref <7.0)

## 2020-09-08 ENCOUNTER — Other Ambulatory Visit: Payer: Self-pay | Admitting: *Deleted

## 2020-09-08 DIAGNOSIS — N182 Chronic kidney disease, stage 2 (mild): Secondary | ICD-10-CM

## 2020-09-08 DIAGNOSIS — E1122 Type 2 diabetes mellitus with diabetic chronic kidney disease: Secondary | ICD-10-CM

## 2020-09-08 DIAGNOSIS — E108 Type 1 diabetes mellitus with unspecified complications: Secondary | ICD-10-CM | POA: Diagnosis not present

## 2020-09-08 LAB — CBC
Hematocrit: 47.4 % (ref 37.5–51.0)
Hemoglobin: 16.6 g/dL (ref 13.0–17.7)
MCH: 33.9 pg — ABNORMAL HIGH (ref 26.6–33.0)
MCHC: 35 g/dL (ref 31.5–35.7)
MCV: 97 fL (ref 79–97)
Platelets: 129 10*3/uL — ABNORMAL LOW (ref 150–450)
RBC: 4.89 x10E6/uL (ref 4.14–5.80)
RDW: 13.1 % (ref 11.6–15.4)
WBC: 7.5 10*3/uL (ref 3.4–10.8)

## 2020-09-08 LAB — CMP14+EGFR
ALT: 45 IU/L — ABNORMAL HIGH (ref 0–44)
AST: 42 IU/L — ABNORMAL HIGH (ref 0–40)
Albumin/Globulin Ratio: 1.2 (ref 1.2–2.2)
Albumin: 4 g/dL (ref 3.8–4.8)
Alkaline Phosphatase: 92 IU/L (ref 44–121)
BUN/Creatinine Ratio: 13 (ref 10–24)
BUN: 12 mg/dL (ref 8–27)
Bilirubin Total: 0.7 mg/dL (ref 0.0–1.2)
CO2: 20 mmol/L (ref 20–29)
Calcium: 9.7 mg/dL (ref 8.6–10.2)
Chloride: 105 mmol/L (ref 96–106)
Creatinine, Ser: 0.89 mg/dL (ref 0.76–1.27)
GFR calc Af Amer: 100 mL/min/{1.73_m2} (ref >59)
GFR calc non Af Amer: 87 mL/min/{1.73_m2} (ref >59)
Globulin, Total: 3.3 g/dL (ref 1.5–4.5)
Glucose: 93 mg/dL (ref 65–99)
Potassium: 3.8 mmol/L (ref 3.5–5.2)
Sodium: 141 mmol/L (ref 134–144)
Total Protein: 7.3 g/dL (ref 6.0–8.5)

## 2020-09-08 LAB — TESTOSTERONE,FREE AND TOTAL
Testosterone, Free: 6.2 pg/mL — ABNORMAL LOW (ref 6.6–18.1)
Testosterone: 607 ng/dL (ref 264–916)

## 2020-09-08 MED ORDER — TOUJEO MAX SOLOSTAR 300 UNIT/ML ~~LOC~~ SOPN: 110.0000 [IU] | PEN_INJECTOR | Freq: Every day | SUBCUTANEOUS | 0 refills | Status: DC

## 2020-09-15 DIAGNOSIS — M48061 Spinal stenosis, lumbar region without neurogenic claudication: Secondary | ICD-10-CM | POA: Diagnosis not present

## 2020-09-15 DIAGNOSIS — M5416 Radiculopathy, lumbar region: Secondary | ICD-10-CM | POA: Diagnosis not present

## 2020-09-17 DIAGNOSIS — M25551 Pain in right hip: Secondary | ICD-10-CM | POA: Diagnosis not present

## 2020-09-17 DIAGNOSIS — Z6834 Body mass index (BMI) 34.0-34.9, adult: Secondary | ICD-10-CM | POA: Diagnosis not present

## 2020-09-17 DIAGNOSIS — I10 Essential (primary) hypertension: Secondary | ICD-10-CM | POA: Diagnosis not present

## 2020-09-18 ENCOUNTER — Ambulatory Visit (INDEPENDENT_AMBULATORY_CARE_PROVIDER_SITE_OTHER): Payer: BC Managed Care – PPO | Admitting: Family Medicine

## 2020-09-18 ENCOUNTER — Other Ambulatory Visit: Payer: Self-pay

## 2020-09-18 VITALS — BP 116/64 | HR 77 | Temp 97.1°F | Ht 70.0 in | Wt 238.0 lb

## 2020-09-18 DIAGNOSIS — N182 Chronic kidney disease, stage 2 (mild): Secondary | ICD-10-CM | POA: Diagnosis not present

## 2020-09-18 DIAGNOSIS — Z79890 Hormone replacement therapy (postmenopausal): Secondary | ICD-10-CM | POA: Diagnosis not present

## 2020-09-18 DIAGNOSIS — E785 Hyperlipidemia, unspecified: Secondary | ICD-10-CM | POA: Diagnosis not present

## 2020-09-18 DIAGNOSIS — Z79891 Long term (current) use of opiate analgesic: Secondary | ICD-10-CM | POA: Diagnosis not present

## 2020-09-18 DIAGNOSIS — E1159 Type 2 diabetes mellitus with other circulatory complications: Secondary | ICD-10-CM | POA: Diagnosis not present

## 2020-09-18 DIAGNOSIS — E1169 Type 2 diabetes mellitus with other specified complication: Secondary | ICD-10-CM | POA: Diagnosis not present

## 2020-09-18 DIAGNOSIS — E162 Hypoglycemia, unspecified: Secondary | ICD-10-CM

## 2020-09-18 DIAGNOSIS — Z794 Long term (current) use of insulin: Secondary | ICD-10-CM

## 2020-09-18 DIAGNOSIS — Z5181 Encounter for therapeutic drug level monitoring: Secondary | ICD-10-CM | POA: Diagnosis not present

## 2020-09-18 DIAGNOSIS — E1122 Type 2 diabetes mellitus with diabetic chronic kidney disease: Secondary | ICD-10-CM | POA: Diagnosis not present

## 2020-09-18 DIAGNOSIS — I152 Hypertension secondary to endocrine disorders: Secondary | ICD-10-CM

## 2020-09-18 MED ORDER — CLOTRIMAZOLE-BETAMETHASONE 1-0.05 % EX CREA: 1.0000 "application " | TOPICAL_CREAM | Freq: Two times a day (BID) | CUTANEOUS | 0 refills | Status: DC

## 2020-09-18 MED ORDER — TESTOSTERONE 30 MG/ACT TD SOLN: TRANSDERMAL | 1 refills | Status: DC

## 2020-09-18 MED ORDER — AMOXICILLIN-POT CLAVULANATE 875-125 MG PO TABS: 1.0000 | ORAL_TABLET | Freq: Two times a day (BID) | ORAL | 0 refills | Status: DC

## 2020-09-18 NOTE — Progress Notes (Signed)
Subjective: CC: Follow-up diabetes PCP: [name], DO Tony Cantu is a 70 y.o. male presenting to clinic today for:  1. Type 2 Diabetes with hypertension, hyperlipidemia:  Patient is currently injecting 90 to 100 units of Toujeo daily and has sliding scale Humalog.  He notes that blood sugars have been going into lows of 40s and 50s.  Sometimes it is below 40 and is only right is low.  He is not been discombobulated during these episodes but does feel his hypoglycemic episodes.  Has not seen many sugars above 150 but he really tries to keep his sugars 100 or below in general.  He uses a freestyle libre for checking.  Last eye exam: Up-to-date, Dr. Alethia Berthold Last foot exam: Up-to-date Last A1c:  Lab Results  Component Value Date   HGBA1C 4.7 09/07/2020   Nephropathy screen indicated?:  On ARB Last flu, zoster and/or pneumovax:  Immunization History  Administered Date(s) Administered  . Fluad Quad(high Dose 65+) 05/17/2019, 05/19/2020  . Influenza Split 06/05/2013  . Influenza Whole 06/15/2010, 06/16/2011, 05/29/2012  . Influenza, High Dose Seasonal PF 06/25/2015, 06/12/2017  . Influenza,inj,Quad PF,6+ Mos 06/17/2014, 06/07/2016  . Influenza-Unspecified 05/22/2018  . Moderna SARS-COV2 Booster Vaccination 08/26/2020  . Moderna Sars-Covid-2 Vaccination 11/07/2019, 12/05/2019  . Pneumococcal Conjugate-13 06/05/2013  . Pneumococcal Polysaccharide-23 06/03/2011, 09/16/2019  . Tdap 08/30/2007, 12/27/2017    ROS: Denies dizziness, loss of consciousness, chest pain or shortness of breath.  He was told that he might have a cyst on the bottom of his right kidney on recent MRI of his back.   ROS: Per HPI  Allergies  Allergen Reactions  . Erythromycin Diarrhea  . Sulfonamide Derivatives Diarrhea   Past Medical History:  Diagnosis Date  . Arthritis   . Asthma   . BPH (benign prostatic hypertrophy)   . Cirrhosis ([place])   . Colon polyps   . Diabetes mellitus without  complication (HCC)    TYPE 1   . Diverticulosis   . Gastric ulcer   . Gastric varices    right  . Gastritis   . GERD (gastroesophageal reflux disease)   . Headache    HX  . Hypertension   . PONV (postoperative nausea and vomiting)   . Renal cyst, right   . Seizures (HCC)    HYPOGLYCEMIC LAST 1 AND 1/2 YRS AGO  . Sleep apnea    uses CPAP nightly  . Testicle trouble    one testicle BORN WITH  . Tubular adenoma of colon     Current Outpatient Medications:  .  albuterol (VENTOLIN HFA) 108 (90 Base) MCG/ACT inhaler, Inhale 2 puffs into the lungs every 6 (six) hours as needed for wheezing or shortness of breath., Disp: 6.7 g, Rfl: 0 .  aspirin 81 MG chewable tablet, Chew by mouth daily., Disp: , Rfl:  .  atorvastatin (LIPITOR) 40 MG tablet, Take 1 tablet (40 mg total) by mouth daily., Disp: 90 tablet, Rfl: 0 .  b complex vitamins tablet, Take 1 tablet by mouth daily at 12 noon., Disp: , Rfl:  .  benzonatate (TESSALON) 200 MG capsule, Take 1 capsule (200 mg total) by mouth 3 (three) times daily as needed for cough., Disp: 20 capsule, Rfl: 0 .  Blood Glucose Monitoring Suppl (CONTOUR NEXT ONE) KIT, Test BS QID and as needed Dx E11.9, Disp: 500 kit, Rfl: 3 .  Calcium Carbonate-Vitamin D 600-400 MG-UNIT tablet, Take 1 tablet by mouth daily., Disp: , Rfl:  .  Cholecalciferol (  VITAMIN D3) 2000 UNITS capsule, Take 2,000 Units by mouth daily at 12 noon., Disp: , Rfl:  .  Continuous Blood Gluc Receiver (FREESTYLE LIBRE READER) DEVI, 1 applicator by Does not apply route as directed., Disp: 1 Device, Rfl: 1 .  Continuous Blood Gluc Sensor (FREESTYLE LIBRE SENSOR SYSTEM) MISC, Check BS eight (8) times a day. Dx E10.9, Disp: 3 each, Rfl: 3 .  fexofenadine (ALLEGRA) 180 MG tablet, Take 1 tablet (180 mg total) by mouth daily with breakfast., Disp: 90 tablet, Rfl: 3 .  furosemide (LASIX) 20 MG tablet, Take 1.5 tablets (30 mg total) by mouth daily., Disp: 135 tablet, Rfl: 0 .  Glucagon, rDNA, (GLUCAGON  EMERGENCY) 1 MG KIT, Inject 1 mg into the skin Once PRN for up to 1 dose., Disp: 3 kit, Rfl: 1 .  Glucosamine-Chondroit-Vit C-Mn (GLUCOSAMINE 1500 COMPLEX) CAPS, Take 2 capsules by mouth daily at 12 noon., Disp: , Rfl:  .  glucose blood (ONETOUCH ULTRA) test strip, Use as needed E11.9, Disp: 200 each, Rfl: 12 .  insulin glargine, 2 Unit Dial, (TOUJEO MAX SOLOSTAR) 300 UNIT/ML Solostar Pen, Inject 110 Units into the skin daily., Disp: 45 mL, Rfl: 0 .  insulin lispro (HUMALOG) 100 UNIT/ML injection, INJECT SUBCUTANEOUSLY 20 TO 50 UNITS 3 TIMES DAILY  BEFORE MEALS PER SLIDING  SCALE, Disp: 140 mL, Rfl: 3 .  Microlet Lancets MISC, Test BS QID and as needed Dx E11.9, Disp: 500 each, Rfl: 3 .  montelukast (SINGULAIR) 10 MG tablet, Take 1 tablet (10 mg total) by mouth at bedtime., Disp: 90 tablet, Rfl: 0 .  multivitamin-lutein (OCUVITE-LUTEIN) CAPS capsule, Take 1 capsule by mouth daily at 12 noon., Disp: , Rfl:  .  pantoprazole (PROTONIX) 40 MG tablet, Take 1 tablet (40 mg total) by mouth daily., Disp: 90 tablet, Rfl: 1 .  sildenafil (REVATIO) 20 MG tablet, TAKE 2-5 TABLETS AS NEEDED PRIOR TO SEXUAL ACTIVITY, Disp: 50 tablet, Rfl: 1 .  Testosterone 30 MG/ACT SOLN, APPLY ONE PUMP TOPICALLY  UNDER EACH AXILLA DAILY  (TOTAL OF 60MG DAILY ), Disp: 270 mL, Rfl: 1 .  valsartan (DIOVAN) 160 MG tablet, Take 1 tablet (160 mg total) by mouth daily., Disp: 90 tablet, Rfl: 0 Social History   Socioeconomic History  . Marital status: Married    Spouse name: Not on file  . Number of children: Not on file  . Years of education: Not on file  . Highest education level: Not on file  Occupational History  . Occupation: retired   Tobacco Use  . Smoking status: Former Smoker    Years: 34.00    Types: Cigarettes, Pipe  . Smokeless tobacco: Never Used  . Tobacco comment: quit 2005 smoked cigarettes for 5 yrs prior to pipe use  Vaping Use  . Vaping Use: Never used  Substance and Sexual Activity  . Alcohol use: No     Comment: once a year  . Drug use: No  . Sexual activity: Yes  Other Topics Concern  . Not on file  Social History Narrative  . Not on file   Social Determinants of Health   Financial Resource Strain: Not on file  Food Insecurity: Not on file  Transportation Needs: Not on file  Physical Activity: Not on file  Stress: Not on file  Social Connections: Not on file  Intimate Partner Violence: Not on file   Family History  Problem Relation Age of Onset  . Heart failure Mother   . Heart attack Mother  in her 3's  . Alzheimer's disease Mother   . Diabetes Mother   . Asthma Father   . Suicidality Father        in his 44's  . Allergic rhinitis Sister   . Heart failure Maternal Grandmother   . Rheum arthritis Maternal Grandmother   . Breast cancer Maternal Grandmother   . Heart attack Maternal Grandmother        in her 42's  . Colon cancer Neg Hx   . Esophageal cancer Neg Hx   . Pancreatic cancer Neg Hx   . Liver disease Neg Hx     Objective: Office vital signs reviewed. BP 116/64   Pulse 77   Temp (!) 97.1 F (36.2 C) (Temporal)   Ht 5' 10"  (1.778 m)   Wt 238 lb (108 kg)   SpO2 96%   BMI 34.15 kg/m   Physical Examination:  General: Awake, alert, well nourished, No acute distress HEENT: Normal; sclera white.  Moist mucous membrane Cardio: regular rate and rhythm, S1S2 heard, no murmurs appreciated Pulm: clear to auscultation bilaterally, no wheezes, rhonchi or rales; normal work of breathing on room air Extremities: warm, well perfused, No edema, cyanosis or clubbing; +2 pulses bilaterally MSK: normal gait and station  Assessment/ Plan: 70 y.o. male   Controlled type 2 diabetes mellitus with stage 2 chronic kidney disease, with long-term current use of insulin (Tony Cantu) - Plan: CANCELED: Microalbumin / creatinine urine ratio  Hypertension associated with type 2 diabetes mellitus (Tony Cantu)  Hyperlipidemia associated with type 2 diabetes mellitus  (Tony Cantu)  Hypoglycemia  Encounter for monitoring testosterone replacement therapy - Plan: ToxASSURE Select 13 (MW), Urine  Very concerned that he has ongoing episodes of hypoglycemia.  I reiterated that he should not try and keep blood sugars extremely tight since he tends to have more hypoglycemic episodes than not.  I have advised him to reduce his Toujeo to 80 units at bedtime.  Monitor blood sugars.  Would like to ideally keep his blood sugars around 120 but never below 70 and hopefully not above 150.  He will follow-up in 3 months for recheck  Blood pressure is well controlled  Continue statin  UDS was obtained for ongoing testosterone treatment as per office policy  No orders of the defined types were placed in this encounter.   [name], DO [place] [phone]

## 2020-09-18 NOTE — Patient Instructions (Signed)
REDUCE Toujeo to 80u at bedtime.  I would like you to keep your sugars around 120. Ideally not above 150 but definitely not below 70.

## 2020-09-26 DIAGNOSIS — Z794 Long term (current) use of insulin: Secondary | ICD-10-CM | POA: Diagnosis not present

## 2020-09-26 DIAGNOSIS — E108 Type 1 diabetes mellitus with unspecified complications: Secondary | ICD-10-CM | POA: Diagnosis not present

## 2020-10-06 DIAGNOSIS — Z6834 Body mass index (BMI) 34.0-34.9, adult: Secondary | ICD-10-CM | POA: Diagnosis not present

## 2020-10-06 DIAGNOSIS — M7918 Myalgia, other site: Secondary | ICD-10-CM | POA: Diagnosis not present

## 2020-10-06 DIAGNOSIS — G8929 Other chronic pain: Secondary | ICD-10-CM | POA: Diagnosis not present

## 2020-10-06 DIAGNOSIS — R030 Elevated blood-pressure reading, without diagnosis of hypertension: Secondary | ICD-10-CM | POA: Diagnosis not present

## 2020-10-06 DIAGNOSIS — M545 Low back pain, unspecified: Secondary | ICD-10-CM | POA: Diagnosis not present

## 2020-10-08 LAB — TOXASSURE SELECT 13 (MW), URINE

## 2020-10-16 ENCOUNTER — Encounter: Payer: Self-pay | Admitting: Family Medicine

## 2020-10-18 ENCOUNTER — Other Ambulatory Visit: Payer: Self-pay | Admitting: Family Medicine

## 2020-10-18 DIAGNOSIS — R600 Localized edema: Secondary | ICD-10-CM

## 2020-10-18 DIAGNOSIS — K219 Gastro-esophageal reflux disease without esophagitis: Secondary | ICD-10-CM

## 2020-10-18 DIAGNOSIS — J301 Allergic rhinitis due to pollen: Secondary | ICD-10-CM

## 2020-10-20 ENCOUNTER — Ambulatory Visit: Payer: Medicare Other | Admitting: Family Medicine

## 2020-10-20 DIAGNOSIS — S83242D Other tear of medial meniscus, current injury, left knee, subsequent encounter: Secondary | ICD-10-CM | POA: Diagnosis not present

## 2020-10-20 DIAGNOSIS — S83282D Other tear of lateral meniscus, current injury, left knee, subsequent encounter: Secondary | ICD-10-CM | POA: Diagnosis not present

## 2020-10-26 DIAGNOSIS — E108 Type 1 diabetes mellitus with unspecified complications: Secondary | ICD-10-CM | POA: Diagnosis not present

## 2020-10-26 DIAGNOSIS — Z794 Long term (current) use of insulin: Secondary | ICD-10-CM | POA: Diagnosis not present

## 2020-10-31 DIAGNOSIS — M25561 Pain in right knee: Secondary | ICD-10-CM | POA: Diagnosis not present

## 2020-10-31 DIAGNOSIS — M25562 Pain in left knee: Secondary | ICD-10-CM | POA: Diagnosis not present

## 2020-11-03 ENCOUNTER — Ambulatory Visit: Payer: Medicare Other | Admitting: Family Medicine

## 2020-11-05 DIAGNOSIS — M1711 Unilateral primary osteoarthritis, right knee: Secondary | ICD-10-CM | POA: Diagnosis not present

## 2020-11-05 DIAGNOSIS — S83282A Other tear of lateral meniscus, current injury, left knee, initial encounter: Secondary | ICD-10-CM | POA: Diagnosis not present

## 2020-11-06 ENCOUNTER — Telehealth: Payer: Self-pay | Admitting: *Deleted

## 2020-11-06 NOTE — Telephone Encounter (Signed)
Tony Cantu. Tony Cantu 70 year old male is requesting cardiac clearance for left knee arthroplasty.  He was last seen in the clinic on 06/24/2020.  He was doing well at that time.  I contacted him today as part of the preoperative clearance protocol.  He denied cardiac complaints/concerns.  His PMH includes nonrheumatic aortic valve stenosis, dyslipidemia, paroxysmal atrial fibrillation, and obstructive sleep apnea.  Echocardiogram 11/21 showed normal EF and moderate aortic valve stenosis.  May his aspirin be held prior to his surgery?  Thank you for your help.  Please direct response to [facility] preop pool.  Jossie Ng. Renee Erb NP-C    11/06/2020, 11:11 AM [place] [address] Office [phone] Fax [phone]

## 2020-11-06 NOTE — Telephone Encounter (Signed)
   [facility] Pre-operative Risk Assessment    Primary cardiologist:  Dr Percival Spanish   Request for surgical clearance:  1. What type of surgery is being performed? LEFT KNEE ARTHROPLASTY   2. When is this surgery scheduled? 11/27/20   3. What type of clearance is required (medical clearance vs. Pharmacy clearance to hold med vs. Both)? MEDICAL   4. Are there any medications that need to be held prior to surgery and how long?ASPIRIN   5. Practice name and name of physician performing surgery? [facility] ; DR Frederik Pear   6.  What is the office phone number?  [place]  7.   What is the office fax number? [place]  8.   Anesthesia type (None, local, MAC, general) ?  MAC ANESTHESIA WITH A  BLOCK    Raiford Simmonds 11/06/2020, 10:51 AM  _________________________________________________________________   (provider comments below)

## 2020-11-07 NOTE — Telephone Encounter (Signed)
OK to hold ASA for this procedure.

## 2020-11-09 NOTE — Telephone Encounter (Signed)
   Primary Cardiologist: Minus Breeding, MD  Chart reviewed as part of pre-operative protocol coverage. Given past medical history and time since last visit, based on ACC/AHA guidelines, Tony Cantu would be at acceptable risk for the planned procedure without further cardiovascular testing.   OK to hold aspirin 3-5 days pre op if needed.   The patient was advised that if he develops new symptoms prior to surgery to contact our office to arrange for a follow-up visit, and he verbalized understanding.  I will route this recommendation to the requesting party via Epic fax function and remove from pre-op pool.  Please call with questions.  Kerin Ransom, PA-C 11/09/2020, 9:24 AM

## 2020-11-17 ENCOUNTER — Ambulatory Visit (INDEPENDENT_AMBULATORY_CARE_PROVIDER_SITE_OTHER): Payer: BC Managed Care – PPO | Admitting: Family Medicine

## 2020-11-17 DIAGNOSIS — N182 Chronic kidney disease, stage 2 (mild): Secondary | ICD-10-CM | POA: Diagnosis not present

## 2020-11-17 DIAGNOSIS — E1122 Type 2 diabetes mellitus with diabetic chronic kidney disease: Secondary | ICD-10-CM

## 2020-11-17 DIAGNOSIS — Z794 Long term (current) use of insulin: Secondary | ICD-10-CM

## 2020-11-17 MED ORDER — TOUJEO MAX SOLOSTAR 300 UNIT/ML ~~LOC~~ SOPN: 80.0000 [IU] | PEN_INJECTOR | Freq: Every day | SUBCUTANEOUS | 0 refills | Status: DC

## 2020-11-17 NOTE — Progress Notes (Signed)
Telephone visit  Subjective: CC: BGs PCP: [name], DO YOV:Tony Cantu is a 70 y.o. male calls for telephone consult today. Patient provides verbal consent for consult held via phone.  Due to COVID-19 pandemic this visit was conducted virtually. This visit type was conducted due to national recommendations for restrictions regarding the COVID-19 Pandemic (e.g. social distancing, sheltering in place) in an effort to limit this patient's exposure and mitigate transmission in our community. All issues noted in this document were discussed and addressed.  A physical exam was not performed with this format.   Location of patient: home Location of provider: [facility] Others present for call: none  1. DM Had 2 steroid shots since last visit. BGs have been controlled.  His morning BG 88 this morning.  He still sees 60s occasionally.  Currently back up to 90 units of toujeo due to steroid shots.  Average BGs have been 100.  Seeing Dr Mayer Camel for surgery soon, due 11/27/20.  Cleared by Cardiology.  No CP, SOB, dizziness, loss of consciousness.      ROS: Per HPI  Allergies  Allergen Reactions  . Erythromycin Diarrhea  . Sulfonamide Derivatives Diarrhea   Past Medical History:  Diagnosis Date  . Arthritis   . Asthma   . BPH (benign prostatic hypertrophy)   . Cirrhosis ([place])   . Colon polyps   . Diabetes mellitus without complication (HCC)    TYPE 1   . Diverticulosis   . Gastric ulcer   . Gastric varices    right  . Gastritis   . GERD (gastroesophageal reflux disease)   . Headache    HX  . Hypertension   . PONV (postoperative nausea and vomiting)   . Renal cyst, right   . Seizures (HCC)    HYPOGLYCEMIC LAST 1 AND 1/2 YRS AGO  . Sleep apnea    uses CPAP nightly  . Testicle trouble    one testicle BORN WITH  . Tubular adenoma of colon     Current Outpatient Medications:  .  albuterol (VENTOLIN HFA) 108 (90 Base) MCG/ACT inhaler, Inhale 2 puffs into the lungs every 6  (six) hours as needed for wheezing or shortness of breath., Disp: 6.7 g, Rfl: 0 .  aspirin 81 MG chewable tablet, Chew by mouth daily., Disp: , Rfl:  .  atorvastatin (LIPITOR) 40 MG tablet, TAKE 1 TABLET BY MOUTH  DAILY, Disp: 90 tablet, Rfl: 0 .  b complex vitamins tablet, Take 1 tablet by mouth daily at 12 noon., Disp: , Rfl:  .  benzonatate (TESSALON) 200 MG capsule, Take 1 capsule (200 mg total) by mouth 3 (three) times daily as needed for cough., Disp: 20 capsule, Rfl: 0 .  Blood Glucose Monitoring Suppl (CONTOUR NEXT ONE) KIT, Test BS QID and as needed Dx E11.9, Disp: 500 kit, Rfl: 3 .  Calcium Carbonate-Vitamin D 600-400 MG-UNIT tablet, Take 1 tablet by mouth daily., Disp: , Rfl:  .  Cholecalciferol (VITAMIN D3) 2000 UNITS capsule, Take 2,000 Units by mouth daily at 12 noon., Disp: , Rfl:  .  Continuous Blood Gluc Receiver (FREESTYLE LIBRE READER) DEVI, 1 applicator by Does not apply route as directed., Disp: 1 Device, Rfl: 1 .  Continuous Blood Gluc Sensor (FREESTYLE LIBRE SENSOR SYSTEM) MISC, Check BS eight (8) times a day. Dx E10.9, Disp: 3 each, Rfl: 3 .  fexofenadine (ALLEGRA) 180 MG tablet, Take 1 tablet (180 mg total) by mouth daily with breakfast., Disp: 90 tablet, Rfl: 3 .  furosemide (LASIX) 20 MG tablet, TAKE 1 AND 1/2 TABLETS BY  MOUTH DAILY, Disp: 135 tablet, Rfl: 0 .  Glucagon, rDNA, (GLUCAGON EMERGENCY) 1 MG KIT, Inject 1 mg into the skin Once PRN for up to 1 dose., Disp: 3 kit, Rfl: 1 .  Glucosamine-Chondroit-Vit C-Mn (GLUCOSAMINE 1500 COMPLEX) CAPS, Take 2 capsules by mouth daily at 12 noon., Disp: , Rfl:  .  glucose blood (ONETOUCH ULTRA) test strip, Use as needed E11.9, Disp: 200 each, Rfl: 12 .  insulin glargine, 2 Unit Dial, (TOUJEO MAX SOLOSTAR) 300 UNIT/ML Solostar Pen, Inject 110 Units into the skin daily., Disp: 45 mL, Rfl: 0 .  insulin lispro (HUMALOG) 100 UNIT/ML injection, INJECT SUBCUTANEOUSLY 20 TO 50 UNITS 3 TIMES DAILY  BEFORE MEALS PER SLIDING  SCALE, Disp: 140  mL, Rfl: 3 .  Microlet Lancets MISC, Test BS QID and as needed Dx E11.9, Disp: 500 each, Rfl: 3 .  montelukast (SINGULAIR) 10 MG tablet, TAKE 1 TABLET BY MOUTH AT  BEDTIME, Disp: 90 tablet, Rfl: 3 .  multivitamin-lutein (OCUVITE-LUTEIN) CAPS capsule, Take 1 capsule by mouth daily at 12 noon., Disp: , Rfl:  .  pantoprazole (PROTONIX) 40 MG tablet, TAKE 1 TABLET BY MOUTH  DAILY, Disp: 90 tablet, Rfl: 0 .  sildenafil (REVATIO) 20 MG tablet, TAKE 2-5 TABLETS AS NEEDED PRIOR TO SEXUAL ACTIVITY, Disp: 50 tablet, Rfl: 1 .  Testosterone 30 MG/ACT SOLN, APPLY ONE PUMP TOPICALLY  UNDER EACH AXILLA DAILY  (TOTAL OF 60MG DAILY ), Disp: 270 mL, Rfl: 1 .  valsartan (DIOVAN) 160 MG tablet, TAKE 1 TABLET BY MOUTH  DAILY, Disp: 90 tablet, Rfl: 0  Assessment/ Plan: 70 y.o. male   Controlled type 2 diabetes mellitus with stage 2 chronic kidney disease, with long-term current use of insulin (HCC) - Plan: insulin glargine, 2 Unit Dial, (TOUJEO MAX SOLOSTAR) 300 UNIT/ML Solostar Pen  No BGs below 60.  Doing better on lower dose of insulin but we discussed that really average blood sugars in the 120s is okay and would get him closer to what I think his goal should be which is A1c of 6.0-6.5.  As far as I am concerned he is cleared for surgery.  He recently got cardiac clearance from his heart doctor.  Will CC to his orthopedist.  Start time: 10:52am End time: 11:01am  Total time spent on patient care (including telephone call/ virtual visit): 9 minutes  [place], [place] [phone]

## 2020-11-21 ENCOUNTER — Other Ambulatory Visit: Payer: Self-pay | Admitting: Family Medicine

## 2020-11-21 DIAGNOSIS — E1122 Type 2 diabetes mellitus with diabetic chronic kidney disease: Secondary | ICD-10-CM

## 2020-11-21 DIAGNOSIS — N182 Chronic kidney disease, stage 2 (mild): Secondary | ICD-10-CM

## 2020-11-24 DIAGNOSIS — E108 Type 1 diabetes mellitus with unspecified complications: Secondary | ICD-10-CM | POA: Diagnosis not present

## 2020-11-24 DIAGNOSIS — Z794 Long term (current) use of insulin: Secondary | ICD-10-CM | POA: Diagnosis not present

## 2020-11-27 DIAGNOSIS — M94262 Chondromalacia, left knee: Secondary | ICD-10-CM | POA: Diagnosis not present

## 2020-11-27 DIAGNOSIS — X58XXXA Exposure to other specified factors, initial encounter: Secondary | ICD-10-CM | POA: Diagnosis not present

## 2020-11-27 DIAGNOSIS — M23322 Other meniscus derangements, posterior horn of medial meniscus, left knee: Secondary | ICD-10-CM | POA: Diagnosis not present

## 2020-11-27 DIAGNOSIS — Y999 Unspecified external cause status: Secondary | ICD-10-CM | POA: Diagnosis not present

## 2020-11-27 DIAGNOSIS — S83282A Other tear of lateral meniscus, current injury, left knee, initial encounter: Secondary | ICD-10-CM | POA: Diagnosis not present

## 2020-11-27 DIAGNOSIS — S83242A Other tear of medial meniscus, current injury, left knee, initial encounter: Secondary | ICD-10-CM | POA: Diagnosis not present

## 2020-11-27 DIAGNOSIS — M23362 Other meniscus derangements, other lateral meniscus, left knee: Secondary | ICD-10-CM | POA: Diagnosis not present

## 2020-11-27 DIAGNOSIS — G8918 Other acute postprocedural pain: Secondary | ICD-10-CM | POA: Diagnosis not present

## 2020-12-03 DIAGNOSIS — S83282A Other tear of lateral meniscus, current injury, left knee, initial encounter: Secondary | ICD-10-CM | POA: Diagnosis not present

## 2020-12-15 DIAGNOSIS — E108 Type 1 diabetes mellitus with unspecified complications: Secondary | ICD-10-CM | POA: Diagnosis not present

## 2020-12-23 ENCOUNTER — Encounter: Payer: Self-pay | Admitting: Family Medicine

## 2020-12-23 DIAGNOSIS — E1159 Type 2 diabetes mellitus with other circulatory complications: Secondary | ICD-10-CM

## 2020-12-23 DIAGNOSIS — E1122 Type 2 diabetes mellitus with diabetic chronic kidney disease: Secondary | ICD-10-CM

## 2020-12-23 DIAGNOSIS — E1169 Type 2 diabetes mellitus with other specified complication: Secondary | ICD-10-CM

## 2020-12-23 DIAGNOSIS — I152 Hypertension secondary to endocrine disorders: Secondary | ICD-10-CM

## 2020-12-23 DIAGNOSIS — N182 Chronic kidney disease, stage 2 (mild): Secondary | ICD-10-CM

## 2020-12-25 DIAGNOSIS — E108 Type 1 diabetes mellitus with unspecified complications: Secondary | ICD-10-CM | POA: Diagnosis not present

## 2020-12-25 DIAGNOSIS — Z794 Long term (current) use of insulin: Secondary | ICD-10-CM | POA: Diagnosis not present

## 2020-12-29 ENCOUNTER — Telehealth: Payer: Self-pay | Admitting: *Deleted

## 2020-12-29 ENCOUNTER — Other Ambulatory Visit: Payer: Medicare Other

## 2020-12-29 ENCOUNTER — Other Ambulatory Visit: Payer: Self-pay

## 2020-12-29 DIAGNOSIS — I152 Hypertension secondary to endocrine disorders: Secondary | ICD-10-CM | POA: Diagnosis not present

## 2020-12-29 DIAGNOSIS — N182 Chronic kidney disease, stage 2 (mild): Secondary | ICD-10-CM | POA: Diagnosis not present

## 2020-12-29 DIAGNOSIS — Z794 Long term (current) use of insulin: Secondary | ICD-10-CM | POA: Diagnosis not present

## 2020-12-29 DIAGNOSIS — R7989 Other specified abnormal findings of blood chemistry: Secondary | ICD-10-CM

## 2020-12-29 DIAGNOSIS — E1169 Type 2 diabetes mellitus with other specified complication: Secondary | ICD-10-CM | POA: Diagnosis not present

## 2020-12-29 DIAGNOSIS — E1122 Type 2 diabetes mellitus with diabetic chronic kidney disease: Secondary | ICD-10-CM

## 2020-12-29 DIAGNOSIS — N400 Enlarged prostate without lower urinary tract symptoms: Secondary | ICD-10-CM | POA: Diagnosis not present

## 2020-12-29 DIAGNOSIS — E1159 Type 2 diabetes mellitus with other circulatory complications: Secondary | ICD-10-CM

## 2020-12-29 LAB — BAYER DCA HB A1C WAIVED: HB A1C (BAYER DCA - WAIVED): 4.7 % (ref <7.0)

## 2020-12-29 NOTE — Telephone Encounter (Signed)
Due to 2nd low A1C of 4.7% - pt was advised to f/u with clinical pharm to discuss decrease in meds.   appt made

## 2020-12-30 LAB — CMP14+EGFR
ALT: 29 [IU]/L (ref 0–44)
AST: 36 [IU]/L (ref 0–40)
Albumin/Globulin Ratio: 1.3 (ref 1.2–2.2)
Albumin: 3.8 g/dL (ref 3.7–4.7)
Alkaline Phosphatase: 79 [IU]/L (ref 44–121)
BUN/Creatinine Ratio: 14 (ref 10–24)
BUN: 14 mg/dL (ref 8–27)
Bilirubin Total: 1.1 mg/dL (ref 0.0–1.2)
CO2: 22 mmol/L (ref 20–29)
Calcium: 9.4 mg/dL (ref 8.6–10.2)
Chloride: 104 mmol/L (ref 96–106)
Creatinine, Ser: 1 mg/dL (ref 0.76–1.27)
Globulin, Total: 2.9 g/dL (ref 1.5–4.5)
Glucose: 83 mg/dL (ref 65–99)
Potassium: 4.8 mmol/L (ref 3.5–5.2)
Sodium: 141 mmol/L (ref 134–144)
Total Protein: 6.7 g/dL (ref 6.0–8.5)
eGFR: 80 mL/min/{1.73_m2}

## 2020-12-30 LAB — LIPID PANEL
Chol/HDL Ratio: 2 ratio (ref 0.0–5.0)
Cholesterol, Total: 108 mg/dL (ref 100–199)
HDL: 55 mg/dL (ref >39)
LDL Chol Calc (NIH): 40 mg/dL (ref 0–99)
Triglycerides: 54 mg/dL (ref 0–149)
VLDL Cholesterol Cal: 13 mg/dL (ref 5–40)

## 2020-12-30 LAB — CBC WITH DIFFERENTIAL/PLATELET
Basophils Absolute: 0.1 10*3/uL (ref 0.0–0.2)
Basos: 1 %
EOS (ABSOLUTE): 0.6 10*3/uL — ABNORMAL HIGH (ref 0.0–0.4)
Eos: 8 %
Hematocrit: 47.4 % (ref 37.5–51.0)
Hemoglobin: 16.2 g/dL (ref 13.0–17.7)
Immature Grans (Abs): 0 10*3/uL (ref 0.0–0.1)
Immature Granulocytes: 1 %
Lymphocytes Absolute: 1.6 10*3/uL (ref 0.7–3.1)
Lymphs: 25 %
MCH: 34.3 pg — ABNORMAL HIGH (ref 26.6–33.0)
MCHC: 34.2 g/dL (ref 31.5–35.7)
MCV: 100 fL — ABNORMAL HIGH (ref 79–97)
Monocytes Absolute: 1 10*3/uL — ABNORMAL HIGH (ref 0.1–0.9)
Monocytes: 16 %
Neutrophils Absolute: 3.2 10*3/uL (ref 1.4–7.0)
Neutrophils: 49 %
Platelets: 106 10*3/uL — ABNORMAL LOW (ref 150–450)
RBC: 4.72 x10E6/uL (ref 4.14–5.80)
RDW: 13.1 % (ref 11.6–15.4)
WBC: 6.5 10*3/uL (ref 3.4–10.8)

## 2020-12-31 LAB — TESTOSTERONE,FREE AND TOTAL
Testosterone, Free: 6.1 pg/mL — ABNORMAL LOW (ref 6.6–18.1)
Testosterone: 696 ng/dL (ref 264–916)

## 2021-01-05 ENCOUNTER — Other Ambulatory Visit: Payer: Self-pay

## 2021-01-05 ENCOUNTER — Ambulatory Visit (INDEPENDENT_AMBULATORY_CARE_PROVIDER_SITE_OTHER): Payer: BC Managed Care – PPO | Admitting: Pharmacist

## 2021-01-05 DIAGNOSIS — E162 Hypoglycemia, unspecified: Secondary | ICD-10-CM

## 2021-01-05 NOTE — Progress Notes (Signed)
    01/05/2021 Name: Tony Cantu MRN: [id number] DOB: 07/18/1951  S:  59 yoM presents for diabetes evaluation, education, and management Patient was referred and last seen by Primary Care Provider on 10/2020.  Insurance coverage/medication affordability: medicare A/B, BCBS commercial  Patient reports adherence with medications. . Current diabetes medications include: toujeo 110 unts, lispro meals . Current hypertension medications include: valsartan Goal 130/80 . Current hyperlipidemia medications include: atorvastatin  Patient reports hypoglycemic events.   Patient reported dietary habits: Eats 2-3 meals/day Discussed meal planning options and Plate method for healthy eating . Avoid sugary drinks and desserts . Incorporate balanced protein, non starchy veggies, 1 serving of carbohydrate with each meal . Increase water intake . Increase physical activity as able  Patient-reported exercise habits: n/a due to knee surgeries   O:  Lab Results  Component Value Date   HGBA1C 4.7 12/29/2020   Lipid Panel     Component Value Date/Time   CHOL 108 12/29/2020 0807   CHOL 107 01/07/2013 0840   TRIG 54 12/29/2020 0807   TRIG 59 03/14/2017 0802   TRIG 47 01/07/2013 0840   HDL 55 12/29/2020 0807   HDL 49 03/14/2017 0802   HDL 53 01/07/2013 0840   CHOLHDL 2.0 12/29/2020 0807   LDLCALC 40 12/29/2020 0807   LDLCALC 44 01/03/2014 0803   LDLCALC 45 01/07/2013 0840      A/P:  Diabetes T1DM vs T2DM currently experiencing hypoglycemia. Patient is able to verbalize appropriate hypoglycemia management plan.  He has 6 glucagon emergency kits on hand in various places.  He keeps gatorade and juice at home and in car.  Patient is adherent with medication.  Patient refuses insulin pump and endocrine.  He states he doses his insulin (lispro) eery 2 hours at home.  He appears to be brittle with slight changes in doses of insulin  -Reports low blood sugar all throughout the day--> see above  Libre data  -Decrease Toujeo 78-80 units nightly; encouraged teaspoonful of peanutbutter before bedtime to avoid nocturnal hypoglycemia  -Continue Humalog  -Extensively discussed pathophysiology of diabetes, recommended lifestyle interventions, dietary effects on blood sugar control  -Counseled on s/sx of and management of hypoglycemia  -Next A1C anticipated 01/19/21.  Written patient instructions provided.  Total time in face to face counseling 20 minutes.   Follow up PCP Clinic Visit ON 01/19/21.   Regina Eck, PharmD, BCPS Clinical Pharmacist, [place]  II Phone [phone]

## 2021-01-07 ENCOUNTER — Other Ambulatory Visit: Payer: Self-pay | Admitting: Family Medicine

## 2021-01-07 DIAGNOSIS — K219 Gastro-esophageal reflux disease without esophagitis: Secondary | ICD-10-CM

## 2021-01-11 ENCOUNTER — Telehealth: Payer: Self-pay

## 2021-01-11 NOTE — Telephone Encounter (Signed)
Spoke with patient to bring SD card to visit tomorrow. Nothing further needed.

## 2021-01-12 ENCOUNTER — Encounter: Payer: Self-pay | Admitting: Primary Care

## 2021-01-12 ENCOUNTER — Other Ambulatory Visit: Payer: Self-pay

## 2021-01-12 ENCOUNTER — Ambulatory Visit (INDEPENDENT_AMBULATORY_CARE_PROVIDER_SITE_OTHER): Payer: BC Managed Care – PPO | Admitting: Primary Care

## 2021-01-12 ENCOUNTER — Ambulatory Visit (INDEPENDENT_AMBULATORY_CARE_PROVIDER_SITE_OTHER): Payer: BC Managed Care – PPO

## 2021-01-12 VITALS — BP 122/70 | HR 62 | Temp 98.0°F | Ht 70.0 in | Wt 238.0 lb

## 2021-01-12 DIAGNOSIS — R059 Cough, unspecified: Secondary | ICD-10-CM

## 2021-01-12 DIAGNOSIS — Z9889 Other specified postprocedural states: Secondary | ICD-10-CM | POA: Diagnosis not present

## 2021-01-12 DIAGNOSIS — Z87891 Personal history of nicotine dependence: Secondary | ICD-10-CM

## 2021-01-12 DIAGNOSIS — J986 Disorders of diaphragm: Secondary | ICD-10-CM | POA: Diagnosis not present

## 2021-01-12 DIAGNOSIS — M1712 Unilateral primary osteoarthritis, left knee: Secondary | ICD-10-CM | POA: Diagnosis not present

## 2021-01-12 DIAGNOSIS — G4733 Obstructive sleep apnea (adult) (pediatric): Secondary | ICD-10-CM | POA: Diagnosis not present

## 2021-01-12 DIAGNOSIS — M1711 Unilateral primary osteoarthritis, right knee: Secondary | ICD-10-CM | POA: Diagnosis not present

## 2021-01-12 NOTE — Addendum Note (Signed)
Addended by: Geanie Logan on: 01/12/2021 09:34 AM   Modules accepted: Orders

## 2021-01-12 NOTE — Progress Notes (Signed)
@Patient  ID: Tony Cantu, male    DOB: July 10, 1951, 70 y.o.   MRN: [id number]  Chief Complaint  Patient presents with  . Follow-up    Requesting info about inspire device.     Referring provider: [name], DO  HPI: 70 year-old male, former smoker. Past medical history significant for obstructive sleep apnea on CPAP, asthma, hypertension, GERD, diabetes type 2, obesity. Patient of Dr. Halford Chessman, last seen by pulmonary nurse practitioner in May 2020. Spirometry showed mild restriction. NPSG in 2013 showed AHI 76/hr. Maintained on CPAP auto titrate 5 to 20 cm H2O. LDCT 02/22/19 showed lung RADS 1S.   Previous [facility] pulmonary encounter:  01/14/2020 Patient presents today for annual follow-up for OSA and asthma. He reports compliance with CPAP every night. His DME company is [employer] in [place]. No issues with pressure setting. He uses full mask mask. Sleeping well at night, very seldomly does he wake up.  He forgot to bring SD card today unfortunately.  He gets winded with some activities, this does not interrupt his daily life.  He has a chronic cough. He uses his Albuterol rescue inhaler once every 6-7 weeks for bronchial congestion. Continues to take Singulair 23m at bedtime. Reports some arthritis issues. He takes fish oil every day. He experiences occasional mid-sternal chest pain that last a few short seconds. He will be seeing his PCP tomororw and cardiologist in August. No active chest pain today in office.   71/17/2022 - Interim  Patient presents today for 1 year follow-up OSA.He is interested in learning about Inspire device. He is complaint with CPAP. Original mask dislocated his jaw back in/around 2015-17. He is sleeping well most nights, occasional insomnia d.t mind racing. He goes to bed at 10pm  and gets out of bed around between 4-6am. Most of the times he sleeps through the night without issues. He has some daytime fatigue, he will take mid-morning nap. He has lost 15-20lbs  over the last 4 years. He eats two healthy meals a day. He has a chronic "smokers" cough. Denies f/c/s, chest tightness, wheezing, shortness of breath, hemoptysis.   Airview download 10/14/20-01/11/21: Usage 90/90 days (100%) > 4 hours Average usage 7 hours 14 mins Pressure 5-20 (15.4cm h20- 95%) Airleaks 37.9L/min AHI 1.9   Imaging: 02/21/19 LDCT- Lungs/Pleura: No suspicious appearing pulmonary nodules or masses are noted. No acute consolidative airspace disease. No pleural Effusions. Lung-RADS 1S, negative. Continue annual screening with low-dose chest CT without contrast in 12 months.   Allergies  Allergen Reactions  . Erythromycin Diarrhea  . Sulfonamide Derivatives Diarrhea    Immunization History  Administered Date(s) Administered  . Fluad Quad(high Dose 65+) 05/17/2019, 05/19/2020  . Influenza Split 06/05/2013  . Influenza Whole 06/15/2010, 06/16/2011, 05/29/2012  . Influenza, High Dose Seasonal PF 06/25/2015, 06/12/2017  . Influenza,inj,Quad PF,6+ Mos 06/17/2014, 06/07/2016  . Influenza-Unspecified 05/22/2018  . Moderna SARS-COV2 Booster Vaccination 08/26/2020  . Moderna Sars-Covid-2 Vaccination 11/07/2019, 12/05/2019  . Pneumococcal Conjugate-13 06/05/2013  . Pneumococcal Polysaccharide-23 06/03/2011, 09/16/2019  . Tdap 08/30/2007, 12/27/2017    Past Medical History:  Diagnosis Date  . Arthritis   . Asthma   . BPH (benign prostatic hypertrophy)   . [place]   . Colon polyps   . Diabetes mellitus without complication (HCC)    TYPE 1   . Diverticulosis   . Gastric ulcer   . Gastric varices    right  . Gastritis   . GERD (gastroesophageal reflux disease)   .  Headache    HX  . Hypertension   . PONV (postoperative nausea and vomiting)   . Renal cyst, right   . Seizures (HCC)    HYPOGLYCEMIC LAST 1 AND 1/2 YRS AGO  . Sleep apnea    uses CPAP nightly  . Testicle trouble    one testicle BORN WITH  . Tubular adenoma of colon     Tobacco  History: Social History   Tobacco Use  Smoking Status Former Smoker  . Years: 34.00  . Types: Cigarettes, Pipe  . Quit date: 2005  . Years since quitting: 17.3  Smokeless Tobacco Never Used  Tobacco Comment   quit 2005 smoked cigarettes for 5 yrs prior to pipe use   Counseling given: Not Answered Comment: quit 2005 smoked cigarettes for 5 yrs prior to pipe use   Outpatient Medications Prior to Visit  Medication Sig Dispense Refill  . aspirin 81 MG chewable tablet Chew by mouth daily.    Marland Kitchen atorvastatin (LIPITOR) 40 MG tablet TAKE 1 TABLET BY MOUTH  DAILY 90 tablet 0  . b complex vitamins tablet Take 1 tablet by mouth daily at 12 noon.    . Blood Glucose Monitoring Suppl (CONTOUR NEXT ONE) KIT Test BS QID and as needed Dx E11.9 500 kit 3  . Calcium Carbonate-Vitamin D 600-400 MG-UNIT tablet Take 1 tablet by mouth daily.    . Cholecalciferol (VITAMIN D3) 2000 UNITS capsule Take 2,000 Units by mouth daily at 12 noon.    . Continuous Blood Gluc Receiver (FREESTYLE LIBRE READER) DEVI 1 applicator by Does not apply route as directed. 1 Device 1  . Continuous Blood Gluc Sensor (FREESTYLE LIBRE SENSOR SYSTEM) MISC Check BS eight (8) times a day. Dx E10.9 3 each 3  . fexofenadine (ALLEGRA) 180 MG tablet Take 1 tablet (180 mg total) by mouth daily with breakfast. 90 tablet 3  . furosemide (LASIX) 20 MG tablet TAKE 1 AND 1/2 TABLETS BY  MOUTH DAILY 135 tablet 0  . Glucagon, rDNA, (GLUCAGON EMERGENCY) 1 MG KIT Inject 1 mg into the skin Once PRN for up to 1 dose. 3 kit 1  . Glucosamine-Chondroit-Vit C-Mn (GLUCOSAMINE 1500 COMPLEX) CAPS Take 2 capsules by mouth daily at 12 noon.    Marland Kitchen glucose blood (ONETOUCH ULTRA) test strip Use as needed E11.9 200 each 12  . insulin lispro (HUMALOG) 100 UNIT/ML injection INJECT SUBCUTANEOUSLY 20 TO 50 UNITS 3 TIMES DAILY  BEFORE MEALS PER SLIDING  SCALE 140 mL 3  . Microlet Lancets MISC Test BS QID and as needed Dx E11.9 500 each 3  . montelukast (SINGULAIR) 10  MG tablet TAKE 1 TABLET BY MOUTH AT  BEDTIME 90 tablet 3  . multivitamin-lutein (OCUVITE-LUTEIN) CAPS capsule Take 1 capsule by mouth daily at 12 noon.    . pantoprazole (PROTONIX) 40 MG tablet TAKE 1 TABLET BY MOUTH  DAILY 90 tablet 0  . sildenafil (REVATIO) 20 MG tablet TAKE 2-5 TABLETS AS NEEDED PRIOR TO SEXUAL ACTIVITY 50 tablet 1  . Testosterone 30 MG/ACT SOLN APPLY ONE PUMP TOPICALLY  UNDER EACH AXILLA DAILY  (TOTAL OF 60MG DAILY ) 270 mL 1  . TOUJEO MAX SOLOSTAR 300 UNIT/ML Solostar Pen INJECT 110 UNITS INTO THE  SKIN DAILY (Patient taking differently: 80 Units.) 36 mL 0  . valsartan (DIOVAN) 160 MG tablet TAKE 1 TABLET BY MOUTH  DAILY 90 tablet 0  . albuterol (VENTOLIN HFA) 108 (90 Base) MCG/ACT inhaler Inhale 2 puffs into the lungs every 6 (six)  hours as needed for wheezing or shortness of breath. (Patient not taking: Reported on 01/12/2021) 6.7 g 0  . benzonatate (TESSALON) 200 MG capsule Take 1 capsule (200 mg total) by mouth 3 (three) times daily as needed for cough. (Patient not taking: Reported on 01/12/2021) 20 capsule 0   No facility-administered medications prior to visit.    Review of Systems  Review of Systems  Constitutional: Negative.   HENT: Negative.   Respiratory: Positive for cough. Negative for chest tightness, shortness of breath and wheezing.   Cardiovascular: Negative.    Physical Exam  BP 122/70 (BP Location: Left Arm, Cuff Size: Normal)   Pulse 62   Temp 98 F (36.7 C) (Temporal)   Ht 5' 10"  (1.778 m)   Wt 238 lb (108 kg)   SpO2 98% Comment: RA  BMI 34.15 kg/m  Physical Exam Constitutional:      Appearance: Normal appearance.  HENT:     Head: Normocephalic and atraumatic.     Mouth/Throat:     Comments: Deferred d/t masking  Cardiovascular:     Rate and Rhythm: Normal rate and regular rhythm.  Pulmonary:     Effort: Pulmonary effort is normal.     Comments: Scant rhonchi left base  Musculoskeletal:        General: Normal range of motion.   Skin:    General: Skin is warm and dry.  Neurological:     General: No focal deficit present.     Mental Status: He is alert and oriented to person, place, and time. Mental status is at baseline.  Psychiatric:        Mood and Affect: Mood normal.        Behavior: Behavior normal.        Thought Content: Thought content normal.        Judgment: Judgment normal.      Lab Results:  CBC    Component Value Date/Time   WBC 6.5 12/29/2020 0807   WBC 11.3 (H) 11/02/2018 0457   RBC 4.72 12/29/2020 0807   RBC 4.43 11/02/2018 0457   HGB 16.2 12/29/2020 0807   HCT 47.4 12/29/2020 0807   PLT 106 (L) 12/29/2020 0807   MCV 100 (H) 12/29/2020 0807   MCH 34.3 (H) 12/29/2020 0807   MCH 32.3 11/02/2018 0457   MCHC 34.2 12/29/2020 0807   MCHC 31.0 11/02/2018 0457   RDW 13.1 12/29/2020 0807   LYMPHSABS 1.6 12/29/2020 0807   MONOABS 1.2 (H) 10/25/2018 1000   EOSABS 0.6 (H) 12/29/2020 0807   BASOSABS 0.1 12/29/2020 0807    BMET    Component Value Date/Time   NA 141 12/29/2020 0807   K 4.8 12/29/2020 0807   CL 104 12/29/2020 0807   CO2 22 12/29/2020 0807   GLUCOSE 83 12/29/2020 0807   GLUCOSE 132 (H) 11/02/2018 0457   BUN 14 12/29/2020 0807   CREATININE 1.00 12/29/2020 0807   CREATININE 0.82 01/07/2013 0840   CALCIUM 9.4 12/29/2020 0807   GFRNONAA 87 09/07/2020 0920   GFRNONAA >89 01/07/2013 0840   GFRAA 100 09/07/2020 0920   GFRAA >89 01/07/2013 0840    BNP    Component Value Date/Time   BNP 10.6 04/21/2016 1148    ProBNP    Component Value Date/Time   PROBNP 30.0 02/21/2012 1008    Imaging: No results found.   Assessment & Plan:   OSA (obstructive sleep apnea) Patient is 100% compliant with CPAP and reports benefit from use. He is interested  in [place] device but wants to hold off on referral at the moment. Pressure 5-20cm h20; Residual AHI 1.9. No changes today, renew CPAP supplies with DME company.   Recommendations:  - Continue to wear CPAP every night for  4-6 hours or longer - Do not drive if experiencing excessive daytime sleepiness - Continue to work on weight loss effort, once you get to your goal we can consider repeating sleeps tudy - Let us know if you want referral to ENT to discuss Dawna Part    Former smoker - Patient quit smoking in 2005. He is no longer eligible for LDCT screening. He has chronic "smokers" cough. He has scant rhonchi left base. Needs routine CXR today. Ok to cancel PFTs as he has no shortness of breath.     Martyn Ehrich, NP 01/12/2021

## 2021-01-12 NOTE — Assessment & Plan Note (Addendum)
Patient is 100% compliant with CPAP and reports benefit from use. He is interested in [place] device but wants to hold off on referral at the moment. Pressure 5-20cm h20; Residual AHI 1.9. No changes today, renew CPAP supplies with DME company.   Recommendations:  - Continue to wear CPAP every night for 4-6 hours or longer - Do not drive if experiencing excessive daytime sleepiness - Continue to work on weight loss effort, once you get to your goal we can consider repeating sleeps tudy - Let us know if you want referral to ENT to discuss [facility]

## 2021-01-12 NOTE — Patient Instructions (Addendum)
Recommendations: - Continue to wear CPAP every night for 4-6 hours or longer - Do not drive if experiencing excessive daytime sleepiness - Continue to work on weight loss effort, once you get to your goal we can consider repeating sleeps tudy - Let us know if you want referral to ENT to discuss Inspire   Orders: - CXR re: cough   Follow-up: - 1 year with Dr. Halford Chessman or Eustaquio Maize NP

## 2021-01-12 NOTE — Assessment & Plan Note (Addendum)
-   Patient quit smoking in 2005. He is no longer eligible for LDCT screening. He has chronic "smokers" cough. He has scant rhonchi left base. Needs routine CXR today. Ok to cancel PFTs as he has no shortness of breath.

## 2021-01-12 NOTE — Progress Notes (Signed)
Reviewed and agree with assessment/plan.   Chesley Mires, MD [facility] Pulmonary/Critical Care 01/12/2021, 10:00 AM Pager:  [phone]

## 2021-01-13 DIAGNOSIS — G4733 Obstructive sleep apnea (adult) (pediatric): Secondary | ICD-10-CM

## 2021-01-14 NOTE — Telephone Encounter (Signed)
Patient would like a referral to ENT to see if he is a canidate for Inspire procedure.  Please advise if you are ok with [place] placing order.

## 2021-01-15 ENCOUNTER — Telehealth: Payer: Self-pay | Admitting: Primary Care

## 2021-01-15 NOTE — Progress Notes (Signed)
Please let patient know CXR showed no evidence of pulmonary nodules/masses or pneumonia. He did have some chronic interstitial markings. Please ask if he has any shortness of breath or cough. If so would order CT chest wo contrast to assess

## 2021-01-15 NOTE — Telephone Encounter (Signed)
CXR result per Geraldo Pitter NP:  Please let patient know CXR showed no evidence of pulmonary nodules/masses or pneumonia. He did have some chronic interstitial markings. Please ask if he has any shortness of breath or cough. If so would order CT chest wo contrast to assess.  Called and spoke with patient, provided results/recommendations per Geraldo Pitter NP.  He states he does cough, but he smoked for a long time, states he coughs more that he would like.  He declined to have a CT scan done at this time.  Nothing further needed at this time.

## 2021-01-18 NOTE — Progress Notes (Signed)
Subjective: CC: hypoglycemia PCP: [name], DO AYT:Tony Cantu is a 70 y.o. male presenting to clinic today for:  1. Hypoglycemia/ hypogonadism Patient had A1c recollected on 12/29/20, which showed persistently low A1c at 4.7.  Free testosterone was also slightly reduced.  He is currently injecting 78 units of Toujeo daily.  He takes of 225 units of Humalog spread throughout meals and snacks throughout the day.  He admits that he had hypoglycemia into the 60s last evening which required him to take a snack.  He however woke up with blood sugars in the 170s.  He is resistant to seeing an endocrinologist.  He admits that he has some stress at home and believes this to be affecting some of his blood sugars as well.  Does not report any blurred vision, polydipsia or polyuria.  Compliant with Diovan and blood pressures are typically running 323 systolic.  2.  Hypogonadism Patient is compliant with testosterone under axillas as directed.  Free testosterone slightly low but patient feels well.  Does not wish to pursue endocrinology eval.   ROS: Per HPI  Allergies  Allergen Reactions  . Erythromycin Diarrhea  . Sulfonamide Derivatives Diarrhea   Past Medical History:  Diagnosis Date  . Arthritis   . Asthma   . BPH (benign prostatic hypertrophy)   . Cirrhosis ([place])   . Colon polyps   . Diabetes mellitus without complication (HCC)    TYPE 1   . Diverticulosis   . Gastric ulcer   . Gastric varices    right  . Gastritis   . GERD (gastroesophageal reflux disease)   . Headache    HX  . Hypertension   . PONV (postoperative nausea and vomiting)   . Renal cyst, right   . Seizures (HCC)    HYPOGLYCEMIC LAST 1 AND 1/2 YRS AGO  . Sleep apnea    uses CPAP nightly  . Testicle trouble    one testicle BORN WITH  . Tubular adenoma of colon     Current Outpatient Medications:  .  albuterol (VENTOLIN HFA) 108 (90 Base) MCG/ACT inhaler, Inhale 2 puffs into the lungs every 6 (six)  hours as needed for wheezing or shortness of breath. (Patient not taking: Reported on 01/12/2021), Disp: 6.7 g, Rfl: 0 .  aspirin 81 MG chewable tablet, Chew by mouth daily., Disp: , Rfl:  .  atorvastatin (LIPITOR) 40 MG tablet, TAKE 1 TABLET BY MOUTH  DAILY, Disp: 90 tablet, Rfl: 0 .  b complex vitamins tablet, Take 1 tablet by mouth daily at 12 noon., Disp: , Rfl:  .  benzonatate (TESSALON) 200 MG capsule, Take 1 capsule (200 mg total) by mouth 3 (three) times daily as needed for cough. (Patient not taking: Reported on 01/12/2021), Disp: 20 capsule, Rfl: 0 .  Blood Glucose Monitoring Suppl (CONTOUR NEXT ONE) KIT, Test BS QID and as needed Dx E11.9, Disp: 500 kit, Rfl: 3 .  Calcium Carbonate-Vitamin D 600-400 MG-UNIT tablet, Take 1 tablet by mouth daily., Disp: , Rfl:  .  Cholecalciferol (VITAMIN D3) 2000 UNITS capsule, Take 2,000 Units by mouth daily at 12 noon., Disp: , Rfl:  .  Continuous Blood Gluc Receiver (FREESTYLE LIBRE READER) DEVI, 1 applicator by Does not apply route as directed., Disp: 1 Device, Rfl: 1 .  Continuous Blood Gluc Sensor (FREESTYLE LIBRE SENSOR SYSTEM) MISC, Check BS eight (8) times a day. Dx E10.9, Disp: 3 each, Rfl: 3 .  fexofenadine (ALLEGRA) 180 MG tablet, Take 1 tablet (  180 mg total) by mouth daily with breakfast., Disp: 90 tablet, Rfl: 3 .  furosemide (LASIX) 20 MG tablet, TAKE 1 AND 1/2 TABLETS BY  MOUTH DAILY, Disp: 135 tablet, Rfl: 0 .  Glucagon, rDNA, (GLUCAGON EMERGENCY) 1 MG KIT, Inject 1 mg into the skin Once PRN for up to 1 dose., Disp: 3 kit, Rfl: 1 .  Glucosamine-Chondroit-Vit C-Mn (GLUCOSAMINE 1500 COMPLEX) CAPS, Take 2 capsules by mouth daily at 12 noon., Disp: , Rfl:  .  glucose blood (ONETOUCH ULTRA) test strip, Use as needed E11.9, Disp: 200 each, Rfl: 12 .  insulin lispro (HUMALOG) 100 UNIT/ML injection, INJECT SUBCUTANEOUSLY 20 TO 50 UNITS 3 TIMES DAILY  BEFORE MEALS PER SLIDING  SCALE, Disp: 140 mL, Rfl: 3 .  Microlet Lancets MISC, Test BS QID and as  needed Dx E11.9, Disp: 500 each, Rfl: 3 .  montelukast (SINGULAIR) 10 MG tablet, TAKE 1 TABLET BY MOUTH AT  BEDTIME, Disp: 90 tablet, Rfl: 3 .  multivitamin-lutein (OCUVITE-LUTEIN) CAPS capsule, Take 1 capsule by mouth daily at 12 noon., Disp: , Rfl:  .  pantoprazole (PROTONIX) 40 MG tablet, TAKE 1 TABLET BY MOUTH  DAILY, Disp: 90 tablet, Rfl: 0 .  sildenafil (REVATIO) 20 MG tablet, TAKE 2-5 TABLETS AS NEEDED PRIOR TO SEXUAL ACTIVITY, Disp: 50 tablet, Rfl: 1 .  Testosterone 30 MG/ACT SOLN, APPLY ONE PUMP TOPICALLY  UNDER EACH AXILLA DAILY  (TOTAL OF 60MG DAILY ), Disp: 270 mL, Rfl: 1 .  TOUJEO MAX SOLOSTAR 300 UNIT/ML Solostar Pen, INJECT 110 UNITS INTO THE  SKIN DAILY (Patient taking differently: 80 Units.), Disp: 36 mL, Rfl: 0 .  valsartan (DIOVAN) 160 MG tablet, TAKE 1 TABLET BY MOUTH  DAILY, Disp: 90 tablet, Rfl: 0 Social History   Socioeconomic History  . Marital status: Married    Spouse name: Not on file  . Number of children: Not on file  . Years of education: Not on file  . Highest education level: Not on file  Occupational History  . Occupation: retired   Tobacco Use  . Smoking status: Former Smoker    Years: 34.00    Types: Cigarettes, Pipe    Quit date: 2005    Years since quitting: 17.4  . Smokeless tobacco: Never Used  . Tobacco comment: quit 2005 smoked cigarettes for 5 yrs prior to pipe use  Vaping Use  . Vaping Use: Never used  Substance and Sexual Activity  . Alcohol use: No    Comment: once a year  . Drug use: No  . Sexual activity: Yes  Other Topics Concern  . Not on file  Social History Narrative  . Not on file   Social Determinants of Health   Financial Resource Strain: Not on file  Food Insecurity: Not on file  Transportation Needs: Not on file  Physical Activity: Not on file  Stress: Not on file  Social Connections: Not on file  Intimate Partner Violence: Not on file   Family History  Problem Relation Age of Onset  . Heart failure Mother   .  Heart attack Mother        in her 3's  . Alzheimer's disease Mother   . Diabetes Mother   . Asthma Father   . Suicidality Father        in his 55's  . Allergic rhinitis Sister   . Heart failure Maternal Grandmother   . Rheum arthritis Maternal Grandmother   . Breast cancer Maternal Grandmother   . Heart attack  Maternal Grandmother        in her 45's  . Colon cancer Neg Hx   . Esophageal cancer Neg Hx   . Pancreatic cancer Neg Hx   . Liver disease Neg Hx     Objective: Office vital signs reviewed. BP (!) 96/51   Pulse 70   Temp 97.7 F (36.5 C)   Ht 5' 10"  (1.778 m)   Wt 232 lb 9.6 oz (105.5 kg)   SpO2 96%   BMI 33.37 kg/m   Physical Examination:  General: Awake, alert, well nourished, No acute distress HEENT: Normal, sclera white, MMM Cardio: regular rate and rhythm, S1S2 heard, 2/6 SEM Pulm: clear to auscultation bilaterally, no wheezes, rhonchi or rales; normal work of breathing on room air Extremities: warm, well perfused, No edema, cyanosis or clubbing; +2 pulses bilaterally  Assessment/ Plan: 70 y.o. male   Controlled type 2 diabetes mellitus with stage 2 chronic kidney disease, with long-term current use of insulin ([place]) - Plan: insulin glargine, 2 Unit Dial, (TOUJEO MAX SOLOSTAR) 300 UNIT/ML Solostar Pen, Bayer DCA Hb A1c Waived  Hypertension associated with type 2 diabetes mellitus (HCC)  Testosterone deficiency  I still think this A1c is too low.  We discussed that his blood sugar is too tightly controlled and I really did ask him to consider management by endocrinology but he does not wish to pursue this.  He will continue with lower dose of insulin and follow-up with me in 3 months.  Would like him to see Almyra Free sometime in between for blood sugar review  Blood pressure is too low.  Cut Diovan to 80 mg daily.  Monitor blood pressures over the next week or so.  Discussed goal of less than 150/90.  Free testosterone slightly low but patient is asymptomatic.   Continue current regimen with testosterone.  Plan for recheck in 3 months  No orders of the defined types were placed in this encounter.  No orders of the defined types were placed in this encounter.    [name], DO [place] [phone]

## 2021-01-19 ENCOUNTER — Encounter: Payer: Self-pay | Admitting: Family Medicine

## 2021-01-19 ENCOUNTER — Other Ambulatory Visit: Payer: Self-pay | Admitting: Family Medicine

## 2021-01-19 ENCOUNTER — Other Ambulatory Visit: Payer: Self-pay

## 2021-01-19 ENCOUNTER — Ambulatory Visit (INDEPENDENT_AMBULATORY_CARE_PROVIDER_SITE_OTHER): Payer: BC Managed Care – PPO | Admitting: Family Medicine

## 2021-01-19 VITALS — BP 96/51 | HR 70 | Temp 97.7°F | Ht 70.0 in | Wt 232.6 lb

## 2021-01-19 DIAGNOSIS — Z794 Long term (current) use of insulin: Secondary | ICD-10-CM | POA: Diagnosis not present

## 2021-01-19 DIAGNOSIS — N182 Chronic kidney disease, stage 2 (mild): Secondary | ICD-10-CM | POA: Diagnosis not present

## 2021-01-19 DIAGNOSIS — E1122 Type 2 diabetes mellitus with diabetic chronic kidney disease: Secondary | ICD-10-CM

## 2021-01-19 DIAGNOSIS — E349 Endocrine disorder, unspecified: Secondary | ICD-10-CM | POA: Diagnosis not present

## 2021-01-19 DIAGNOSIS — E1159 Type 2 diabetes mellitus with other circulatory complications: Secondary | ICD-10-CM | POA: Diagnosis not present

## 2021-01-19 DIAGNOSIS — I152 Hypertension secondary to endocrine disorders: Secondary | ICD-10-CM

## 2021-01-19 MED ORDER — VALSARTAN 160 MG PO TABS: 80.0000 mg | ORAL_TABLET | Freq: Every day | ORAL | 0 refills | Status: DC

## 2021-01-19 MED ORDER — TOUJEO MAX SOLOSTAR 300 UNIT/ML ~~LOC~~ SOPN: 70.0000 [IU] | PEN_INJECTOR | Freq: Every day | SUBCUTANEOUS | 0 refills | Status: DC

## 2021-01-24 ENCOUNTER — Other Ambulatory Visit: Payer: Self-pay | Admitting: Family Medicine

## 2021-01-24 DIAGNOSIS — R600 Localized edema: Secondary | ICD-10-CM

## 2021-01-24 DIAGNOSIS — Z794 Long term (current) use of insulin: Secondary | ICD-10-CM | POA: Diagnosis not present

## 2021-01-24 DIAGNOSIS — E108 Type 1 diabetes mellitus with unspecified complications: Secondary | ICD-10-CM | POA: Diagnosis not present

## 2021-02-09 DIAGNOSIS — M1711 Unilateral primary osteoarthritis, right knee: Secondary | ICD-10-CM | POA: Diagnosis not present

## 2021-02-16 DIAGNOSIS — M1711 Unilateral primary osteoarthritis, right knee: Secondary | ICD-10-CM | POA: Diagnosis not present

## 2021-02-23 DIAGNOSIS — J343 Hypertrophy of nasal turbinates: Secondary | ICD-10-CM | POA: Diagnosis not present

## 2021-02-23 DIAGNOSIS — M1711 Unilateral primary osteoarthritis, right knee: Secondary | ICD-10-CM | POA: Diagnosis not present

## 2021-02-23 DIAGNOSIS — G4733 Obstructive sleep apnea (adult) (pediatric): Secondary | ICD-10-CM | POA: Diagnosis not present

## 2021-02-23 DIAGNOSIS — Z9989 Dependence on other enabling machines and devices: Secondary | ICD-10-CM | POA: Diagnosis not present

## 2021-02-23 DIAGNOSIS — M1712 Unilateral primary osteoarthritis, left knee: Secondary | ICD-10-CM | POA: Diagnosis not present

## 2021-03-02 DIAGNOSIS — M1712 Unilateral primary osteoarthritis, left knee: Secondary | ICD-10-CM | POA: Diagnosis not present

## 2021-03-04 DIAGNOSIS — E108 Type 1 diabetes mellitus with unspecified complications: Secondary | ICD-10-CM | POA: Diagnosis not present

## 2021-03-07 ENCOUNTER — Encounter: Payer: Self-pay | Admitting: Family Medicine

## 2021-03-08 ENCOUNTER — Telehealth: Payer: Self-pay | Admitting: Primary Care

## 2021-03-08 DIAGNOSIS — G4733 Obstructive sleep apnea (adult) (pediatric): Secondary | ICD-10-CM

## 2021-03-08 MED ORDER — FREESTYLE LIBRE SENSOR SYSTEM MISC: 3 refills | Status: DC

## 2021-03-08 NOTE — Telephone Encounter (Signed)
Call returned to patient, spoke with his wife, patient contact, she states the patient is wanting to try the inspire implanted device for his OSA but was told he needs to repeat his sleep study prior. Made aware I would touch basis with MD and get back with him. Voiced understanding.   Upon review I see that a referral has been made for this patient in regards to this to Dr Danie Binder office. Call made to Dr. Victorio Palm office.    Per rep the patient had the consult appt June 28th. They were waiting to hear back from him to see if he was going to move forward with the procedure.   Call made to patient to make him aware to f/u with Dr Redmond Baseman office. Voiced understanding.   Nothing further needed at this time.

## 2021-03-08 NOTE — Telephone Encounter (Signed)
Pt is wanting to check on the status of a HST and was wanting for it to be placed for him. Pls regard; [phone]

## 2021-03-08 NOTE — Telephone Encounter (Signed)
Angie from ENT office called back and states the patient will need to repeat his sleep study per request of ENT.   VS please advise if okay to place order for sleep study. If so do you want [place] to repeat the split night sleep study. Thanks :)

## 2021-03-09 DIAGNOSIS — M1712 Unilateral primary osteoarthritis, left knee: Secondary | ICD-10-CM | POA: Diagnosis not present

## 2021-03-09 NOTE — Addendum Note (Signed)
Addended by: Valerie Salts on: 03/09/2021 10:28 AM   Modules accepted: Orders

## 2021-03-09 NOTE — Telephone Encounter (Signed)
Okay to arrange for home sleep study.

## 2021-03-09 NOTE — Telephone Encounter (Signed)
Order for home sleep study has been placed.

## 2021-03-17 ENCOUNTER — Encounter: Payer: Self-pay | Admitting: Family Medicine

## 2021-03-17 MED ORDER — FREESTYLE LIBRE SENSOR SYSTEM MISC: 3 refills | Status: DC

## 2021-03-22 DIAGNOSIS — E109 Type 1 diabetes mellitus without complications: Secondary | ICD-10-CM | POA: Diagnosis not present

## 2021-03-24 ENCOUNTER — Other Ambulatory Visit: Payer: Self-pay

## 2021-03-24 DIAGNOSIS — K7460 Unspecified cirrhosis of liver: Secondary | ICD-10-CM

## 2021-03-25 ENCOUNTER — Emergency Department (HOSPITAL_BASED_OUTPATIENT_CLINIC_OR_DEPARTMENT_OTHER)
Admission: EM | Admit: 2021-03-25 | Discharge: 2021-03-25 | Disposition: A | Discharge status: Home / Self Care | Payer: BC Managed Care – PPO | Attending: Emergency Medicine | Admitting: Emergency Medicine

## 2021-03-25 ENCOUNTER — Encounter (HOSPITAL_BASED_OUTPATIENT_CLINIC_OR_DEPARTMENT_OTHER): Payer: Self-pay | Admitting: Emergency Medicine

## 2021-03-25 ENCOUNTER — Other Ambulatory Visit: Payer: Self-pay

## 2021-03-25 DIAGNOSIS — Z5321 Procedure and treatment not carried out due to patient leaving prior to being seen by health care provider: Secondary | ICD-10-CM | POA: Insufficient documentation

## 2021-03-25 DIAGNOSIS — R42 Dizziness and giddiness: Secondary | ICD-10-CM | POA: Diagnosis not present

## 2021-03-25 DIAGNOSIS — R531 Weakness: Secondary | ICD-10-CM | POA: Insufficient documentation

## 2021-03-25 LAB — CBC WITH DIFFERENTIAL/PLATELET
HCT: 50.1 % (ref 39.0–52.0)
Hemoglobin: 17.1 g/dL — ABNORMAL HIGH (ref 13.0–17.0)
MCH: 34.7 pg — ABNORMAL HIGH (ref 26.0–34.0)
MCHC: 34.1 g/dL (ref 30.0–36.0)
MCV: 101.6 fL — ABNORMAL HIGH (ref 80.0–100.0)
Platelets: 102 10*3/uL — ABNORMAL LOW (ref 150–400)
RBC: 4.93 MIL/uL (ref 4.22–5.81)
RDW: 14.8 % (ref 11.5–15.5)
WBC: 8.4 10*3/uL (ref 4.0–10.5)
nRBC: 0 % (ref 0.0–0.2)

## 2021-03-25 LAB — COMPREHENSIVE METABOLIC PANEL
ALT: 34 U/L (ref 0–44)
AST: 34 U/L (ref 15–41)
Albumin: 3.7 g/dL (ref 3.5–5.0)
Alkaline Phosphatase: 61 U/L (ref 38–126)
Anion gap: 7 (ref 5–15)
BUN: 13 mg/dL (ref 8–23)
CO2: 25 mmol/L (ref 22–32)
Calcium: 9.1 mg/dL (ref 8.9–10.3)
Chloride: 106 mmol/L (ref 98–111)
Creatinine, Ser: 0.83 mg/dL (ref 0.61–1.24)
GFR, Estimated: >60 mL/min (ref >60)
Glucose, Bld: 170 mg/dL — ABNORMAL HIGH (ref 70–99)
Potassium: 4.1 mmol/L (ref 3.5–5.1)
Sodium: 138 mmol/L (ref 135–145)
Total Bilirubin: 1.4 mg/dL — ABNORMAL HIGH (ref 0.3–1.2)
Total Protein: 7 g/dL (ref 6.5–8.1)

## 2021-03-25 LAB — CBC
HCT: 52.8 % — ABNORMAL HIGH (ref 39.0–52.0)
Hemoglobin: 18 g/dL — ABNORMAL HIGH (ref 13.0–17.0)
MCH: 35 pg — ABNORMAL HIGH (ref 26.0–34.0)
MCHC: 34.1 g/dL (ref 30.0–36.0)
MCV: 102.5 fL — ABNORMAL HIGH (ref 80.0–100.0)
RBC: 5.15 MIL/uL (ref 4.22–5.81)
RDW: 14.8 % (ref 11.5–15.5)
WBC: 7.3 10*3/uL (ref 4.0–10.5)
nRBC: 0 % (ref 0.0–0.2)

## 2021-03-25 LAB — LIPASE, BLOOD: Lipase: <10 U/L — ABNORMAL LOW (ref 11–51)

## 2021-03-25 NOTE — ED Triage Notes (Addendum)
Diarrhea since MN last night , 10 x last night  denies blood in it ,  , states feels weak denies dizziness or abd  pain , has taken OTC meds    , has slowed some

## 2021-03-26 ENCOUNTER — Encounter: Payer: Self-pay | Admitting: Family Medicine

## 2021-03-31 ENCOUNTER — Telehealth: Payer: Self-pay | Admitting: Pulmonary Disease

## 2021-03-31 ENCOUNTER — Other Ambulatory Visit: Payer: Self-pay | Admitting: Family Medicine

## 2021-03-31 DIAGNOSIS — E1122 Type 2 diabetes mellitus with diabetic chronic kidney disease: Secondary | ICD-10-CM

## 2021-03-31 DIAGNOSIS — Z794 Long term (current) use of insulin: Secondary | ICD-10-CM

## 2021-03-31 NOTE — Telephone Encounter (Signed)
PCC's please advise on the HST that was ordered on 07/15.  I did not see where this was scheduled or uploaded to the chart.  Please advise. Thanks

## 2021-04-02 ENCOUNTER — Ambulatory Visit (HOSPITAL_COMMUNITY)
Admission: RE | Admit: 2021-04-02 | Discharge: 2021-04-02 | Disposition: A | Discharge status: Home / Self Care | Payer: BC Managed Care – PPO | Source: Ambulatory Visit | Attending: Internal Medicine | Admitting: Internal Medicine

## 2021-04-02 ENCOUNTER — Other Ambulatory Visit: Payer: Self-pay

## 2021-04-02 DIAGNOSIS — D7389 Other diseases of spleen: Secondary | ICD-10-CM | POA: Diagnosis not present

## 2021-04-02 DIAGNOSIS — K7460 Unspecified cirrhosis of liver: Secondary | ICD-10-CM | POA: Diagnosis not present

## 2021-04-02 DIAGNOSIS — N281 Cyst of kidney, acquired: Secondary | ICD-10-CM | POA: Diagnosis not present

## 2021-04-02 DIAGNOSIS — D734 Cyst of spleen: Secondary | ICD-10-CM | POA: Diagnosis not present

## 2021-04-02 MED ORDER — GADOBUTROL 1 MMOL/ML IV SOLN
10.0000 mL | Freq: Once | INTRAVENOUS | Status: AC | PRN
  Administered 2021-04-02: 10 mL via INTRAVENOUS

## 2021-04-02 NOTE — Telephone Encounter (Signed)
I have scheduled pt for hst on 8/8 at 3:00.  Will send report to Dr Redmond Baseman once read by Dr Halford Chessman.

## 2021-04-02 NOTE — Telephone Encounter (Signed)
I have the study pulled to call pt.  I called Angie at [facility] [phone] (prev phone # was incorrect) and she states they did get study from 2013 but more current study is needed for Inspire eval.  I called pt to schedule hst & had to leave him a vm to call me back.

## 2021-04-06 ENCOUNTER — Other Ambulatory Visit: Payer: Self-pay | Admitting: Family Medicine

## 2021-04-06 DIAGNOSIS — N182 Chronic kidney disease, stage 2 (mild): Secondary | ICD-10-CM

## 2021-04-06 DIAGNOSIS — E1122 Type 2 diabetes mellitus with diabetic chronic kidney disease: Secondary | ICD-10-CM

## 2021-04-06 DIAGNOSIS — E349 Endocrine disorder, unspecified: Secondary | ICD-10-CM

## 2021-04-07 ENCOUNTER — Other Ambulatory Visit: Payer: Self-pay

## 2021-04-07 ENCOUNTER — Ambulatory Visit: Payer: Medicare Other

## 2021-04-07 DIAGNOSIS — G4733 Obstructive sleep apnea (adult) (pediatric): Secondary | ICD-10-CM

## 2021-04-12 ENCOUNTER — Telehealth: Payer: Self-pay | Admitting: Pulmonary Disease

## 2021-04-12 DIAGNOSIS — G4733 Obstructive sleep apnea (adult) (pediatric): Secondary | ICD-10-CM | POA: Diagnosis not present

## 2021-04-12 NOTE — Telephone Encounter (Signed)
Home sleep study 04/07/21 >> AHI 49.3, SpO2 low 79%   Please inform him that his sleep study shows severe obstructive sleep apnea.  Please arrange for ROV with me or NP to discuss treatment options.

## 2021-04-13 ENCOUNTER — Other Ambulatory Visit: Payer: Medicare Other

## 2021-04-13 DIAGNOSIS — Z125 Encounter for screening for malignant neoplasm of prostate: Secondary | ICD-10-CM | POA: Diagnosis not present

## 2021-04-13 DIAGNOSIS — E349 Endocrine disorder, unspecified: Secondary | ICD-10-CM | POA: Diagnosis not present

## 2021-04-13 DIAGNOSIS — Z794 Long term (current) use of insulin: Secondary | ICD-10-CM | POA: Diagnosis not present

## 2021-04-13 DIAGNOSIS — G4733 Obstructive sleep apnea (adult) (pediatric): Secondary | ICD-10-CM | POA: Diagnosis not present

## 2021-04-13 DIAGNOSIS — N182 Chronic kidney disease, stage 2 (mild): Secondary | ICD-10-CM

## 2021-04-13 DIAGNOSIS — E1122 Type 2 diabetes mellitus with diabetic chronic kidney disease: Secondary | ICD-10-CM

## 2021-04-13 LAB — BAYER DCA HB A1C WAIVED: HB A1C (BAYER DCA - WAIVED): 5 % (ref <7.0)

## 2021-04-14 ENCOUNTER — Other Ambulatory Visit: Payer: Self-pay | Admitting: Family Medicine

## 2021-04-14 NOTE — Telephone Encounter (Signed)
ATC patient to go over HST results, per DPR left detailed message with results. Advised patient to call back to get scheduled for follow up appointment with Dr. Halford Chessman.   When patient calls back please scheduled him for appointment in [place] office with Dr. Halford Chessman.

## 2021-04-14 NOTE — Telephone Encounter (Signed)
Pt calling back

## 2021-04-14 NOTE — Telephone Encounter (Signed)
Pt appt not till 10/25 please call him back wants to do the inspire device

## 2021-04-14 NOTE — Telephone Encounter (Signed)
Called and spoke with patient to let him know of HST results. Patient states that him and Dr. Halford Chessman talked about seeing if he is a candidate for the [facility] device and that Dr. Redmond Baseman said he needed an updates sleep study within the last 2 years. Looks like we put in a referral for Dr. Redmond Baseman in May. Will fax over sleep study to Dr. Redmond Baseman office. Called Dr. Redmond Baseman office and left voicemail for his assistant letting her know results have been faxed. Advised her to call if she needed anything further. Nothing further needed at this time.

## 2021-04-15 IMAGING — CT CT CHEST LUNG CANCER SCREENING LOW DOSE
2 series · 13 of 35 positions shown, 16 images · non-contrast
Comparison: No priors.
COMPARISON: No priors.

Addendum:
CLINICAL DATA: 68-year-old male former smoker (quit in 1771) with
45 pack-year history of smoking. Lung cancer screening examination.

EXAM:
CT CHEST WITHOUT CONTRAST LOW-DOSE FOR LUNG CANCER SCREENING
TECHNIQUE: Multidetector CT imaging of the chest was performed following the
standard protocol without IV contrast.

[Series 5: coronal · coronal · 0.63mm/px · 3 of 301 slices shown]
[im 61/301  lung]
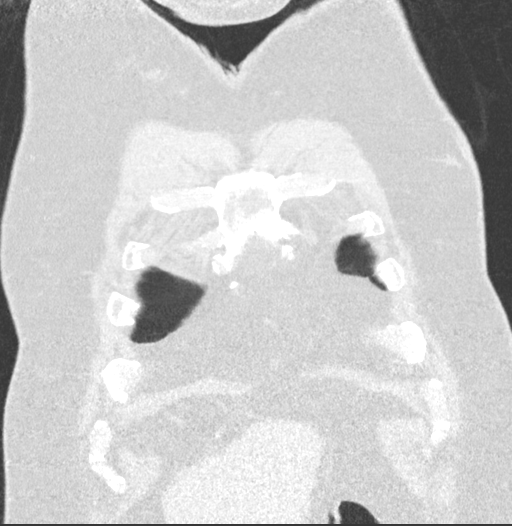
[im 121/301  lung]
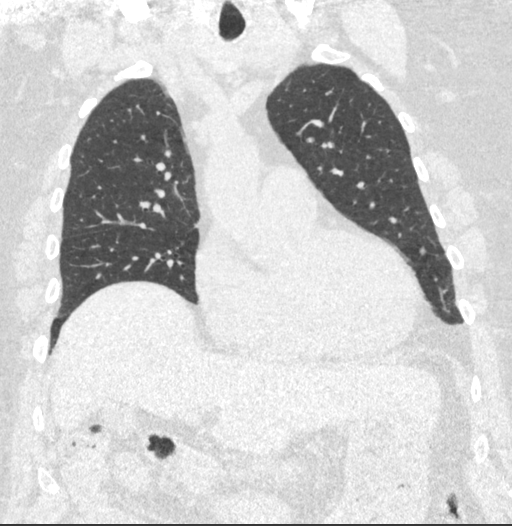
[im 181/301  lung]
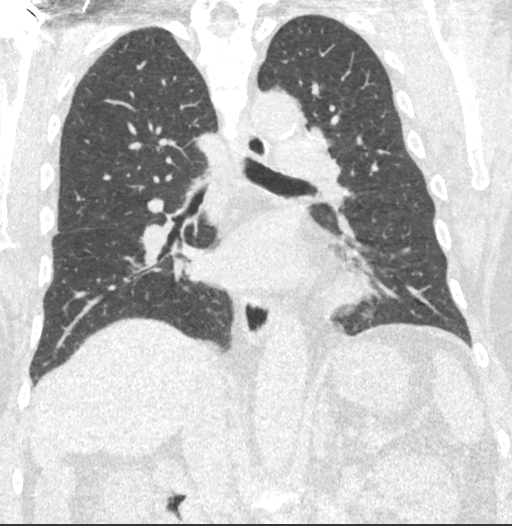

[Series 8: axial st · axial · 0.71mm/px · z∈[-27,+268]mm · 10 of 71 slices shown, 13 images]
[im 6/71  mediastinal]
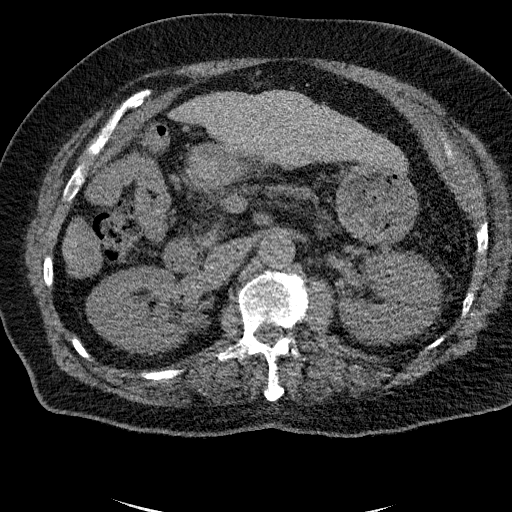
[im 6/71  lung]
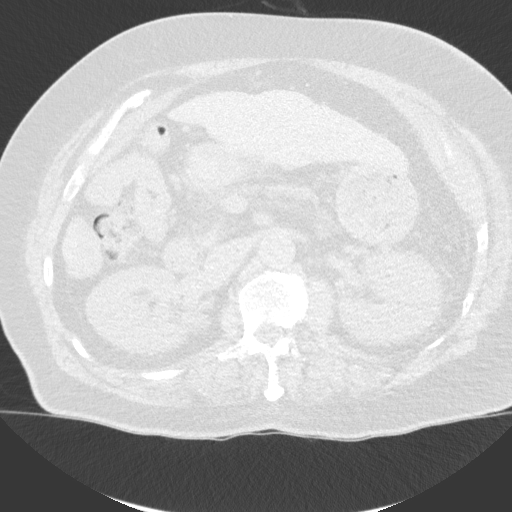
[im 13/71  lung]
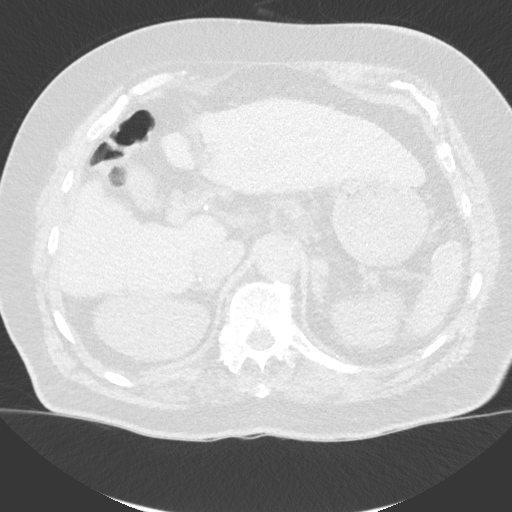
[im 19/71  lung]
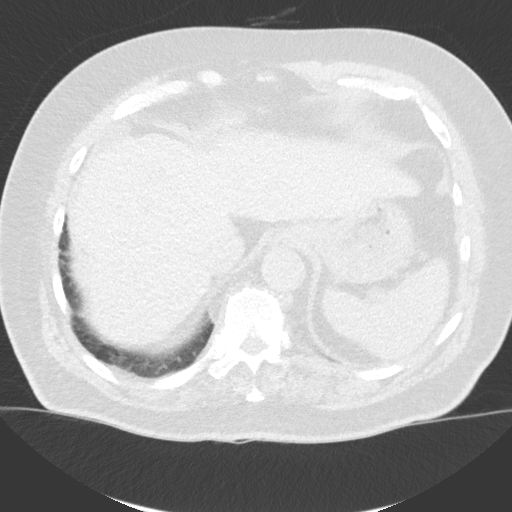
[im 26/71  lung]
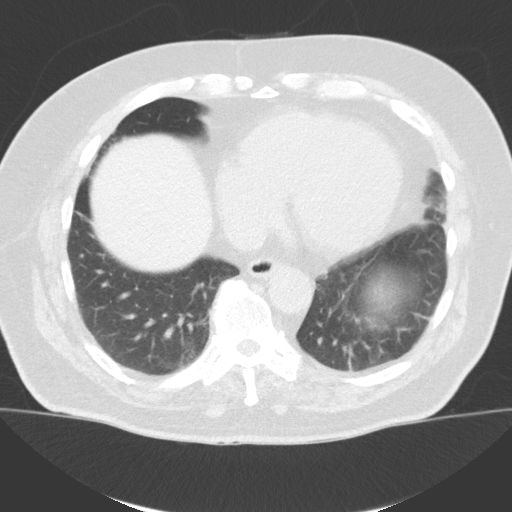
[im 34/71  mediastinal]
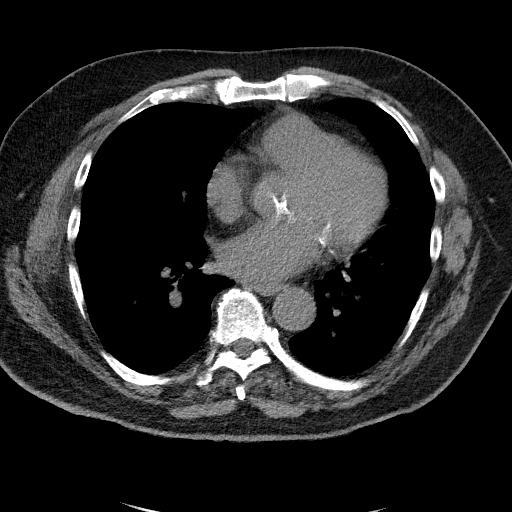
[im 34/71  lung]
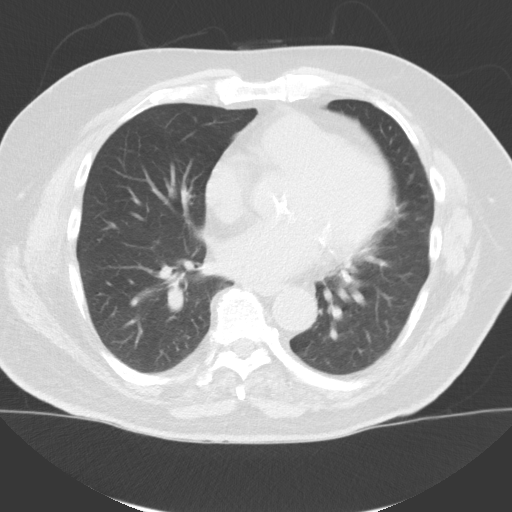
[im 37/71  lung]
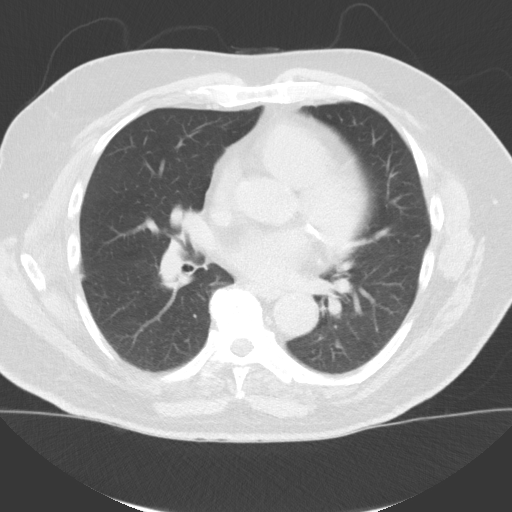
[im 45/71  lung]
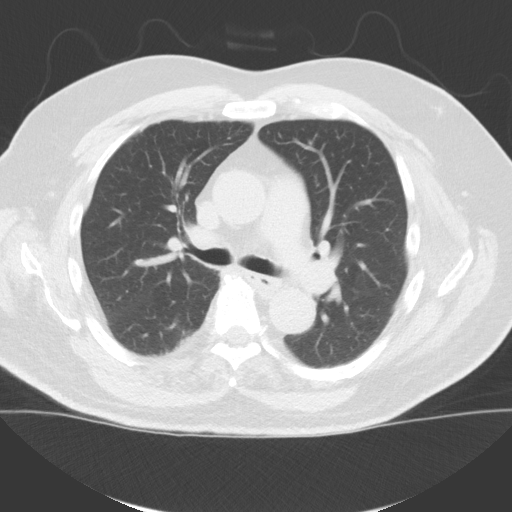
[im 52/71  lung]
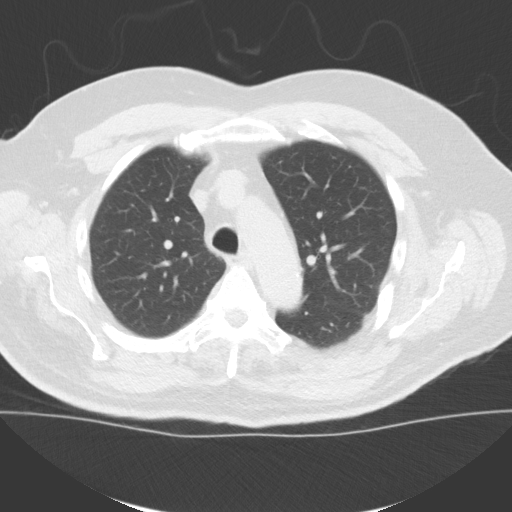
[im 58/71  mediastinal]
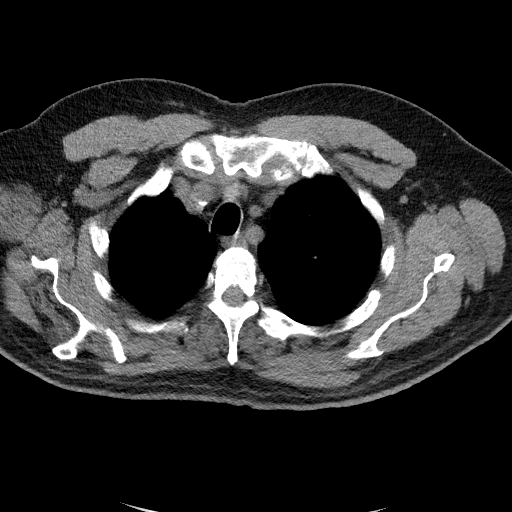
[im 58/71  lung]
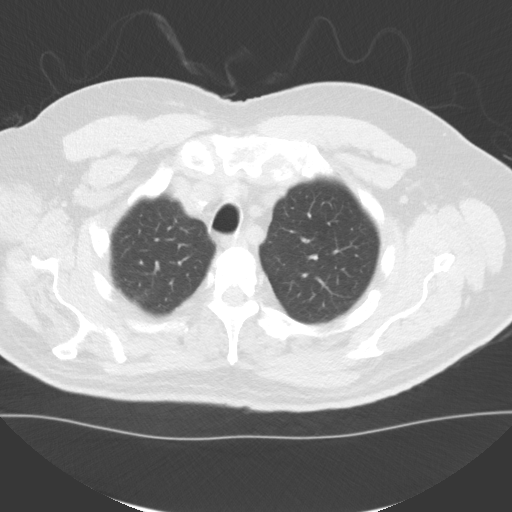
[im 65/71  lung]
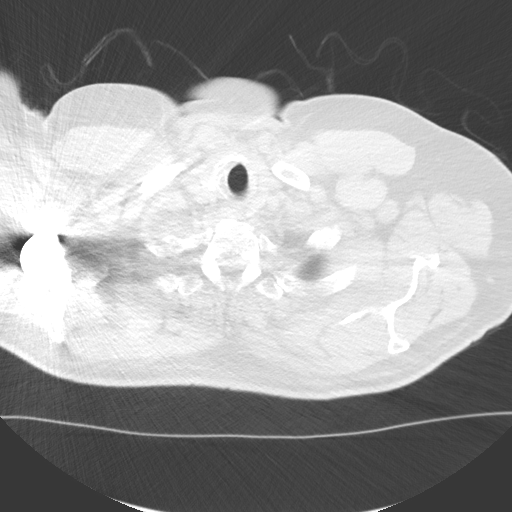

[13 of 35 positions shown; findings below may reference images not displayed]

FINDINGS: Cardiovascular: Heart size is normal. There is no significant
pericardial fluid, thickening or pericardial calcification. There is
aortic atherosclerosis, as well as atherosclerosis of the great
vessels of the mediastinum and the coronary arteries, including
calcified atherosclerotic plaque in the left anterior descending and
right coronary arteries. Severe calcifications of the aortic valve
and superior aspect of the mitral annulus.

Mediastinum/Nodes: No pathologically enlarged mediastinal or hilar
lymph nodes. Please note that accurate exclusion of hilar adenopathy
is limited on noncontrast CT scans. Esophagus is unremarkable in
appearance. Bilateral adrenal glands are normal in appearance.

Lungs/Pleura: No suspicious appearing pulmonary nodules or masses
are noted. No acute consolidative airspace disease. No pleural
effusions.

Upper Abdomen: Aortic atherosclerosis. Exophytic low-attenuation
lesion extending from the medial aspect of the upper pole the right
kidney measuring 2.8 cm in diameter, incompletely characterized on
today's non-contrast CT examination, but statistically likely to
represent a cyst.

Musculoskeletal: Status post right shoulder arthroplasty. There are
no aggressive appearing lytic or blastic lesions noted in the
visualized portions of the skeleton.
IMPRESSION: 1. Lung-RADS 1S, negative. Continue annual screening with low-dose
chest CT without contrast in 12 months.
2. The "S" modifier above refers to potentially clinically
significant non lung cancer related findings. Specifically, there is
aortic atherosclerosis, in addition to 2 vessel coronary artery
disease. Please note that although the presence of coronary artery
calcium documents the presence of coronary artery disease, the
severity of this disease and any potential stenosis cannot be
assessed on this non-gated CT examination. Assessment for potential
risk factor modification, dietary therapy or pharmacologic therapy
may be warranted, if clinically indicated.

Aortic Atherosclerosis (TE29O-FFO.O).

ADDENDUM:
The low-attenuation lesion in the upper pole the right kidney is
incompletely characterize, but stable in size dating back to
11/22/2016, statistically likely a cyst. This could be further
evaluated with renal ultrasound if of clinical concern, but is of
doubtful clinical significance.

*** End of Addendum ***
FINDINGS: Cardiovascular: Heart size is normal. There is no significant
pericardial fluid, thickening or pericardial calcification. There is
aortic atherosclerosis, as well as atherosclerosis of the great
vessels of the mediastinum and the coronary arteries, including
calcified atherosclerotic plaque in the left anterior descending and
right coronary arteries. Severe calcifications of the aortic valve
and superior aspect of the mitral annulus.

Mediastinum/Nodes: No pathologically enlarged mediastinal or hilar
lymph nodes. Please note that accurate exclusion of hilar adenopathy
is limited on noncontrast CT scans. Esophagus is unremarkable in
appearance. Bilateral adrenal glands are normal in appearance.

Lungs/Pleura: No suspicious appearing pulmonary nodules or masses
are noted. No acute consolidative airspace disease. No pleural
effusions.

Upper Abdomen: Aortic atherosclerosis. Exophytic low-attenuation
lesion extending from the medial aspect of the upper pole the right
kidney measuring 2.8 cm in diameter, incompletely characterized on
today's non-contrast CT examination, but statistically likely to
represent a cyst.

Musculoskeletal: Status post right shoulder arthroplasty. There are
no aggressive appearing lytic or blastic lesions noted in the
visualized portions of the skeleton.
IMPRESSION: 1. Lung-RADS 1S, negative. Continue annual screening with low-dose
chest CT without contrast in 12 months.
2. The "S" modifier above refers to potentially clinically
significant non lung cancer related findings. Specifically, there is
aortic atherosclerosis, in addition to 2 vessel coronary artery
disease. Please note that although the presence of coronary artery
calcium documents the presence of coronary artery disease, the
severity of this disease and any potential stenosis cannot be
assessed on this non-gated CT examination. Assessment for potential
risk factor modification, dietary therapy or pharmacologic therapy
may be warranted, if clinically indicated.

Aortic Atherosclerosis (TE29O-FFO.O).

## 2021-04-16 ENCOUNTER — Encounter (HOSPITAL_COMMUNITY): Payer: Self-pay

## 2021-04-16 ENCOUNTER — Emergency Department (HOSPITAL_COMMUNITY)
Admission: EM | Admit: 2021-04-16 | Discharge: 2021-04-17 | Disposition: A | Discharge status: Home / Self Care | Payer: BC Managed Care – PPO | Attending: Emergency Medicine | Admitting: Emergency Medicine

## 2021-04-16 ENCOUNTER — Encounter: Payer: Self-pay | Admitting: Emergency Medicine

## 2021-04-16 ENCOUNTER — Other Ambulatory Visit: Payer: Self-pay

## 2021-04-16 ENCOUNTER — Ambulatory Visit: Payer: Medicare Other

## 2021-04-16 ENCOUNTER — Emergency Department (HOSPITAL_COMMUNITY): Payer: BC Managed Care – PPO

## 2021-04-16 ENCOUNTER — Ambulatory Visit
Admission: EM | Admit: 2021-04-16 | Discharge: 2021-04-16 | Disposition: A | Discharge status: Home / Self Care | Payer: Medicare Other | Attending: Emergency Medicine | Admitting: Emergency Medicine

## 2021-04-16 DIAGNOSIS — Z7982 Long term (current) use of aspirin: Secondary | ICD-10-CM | POA: Diagnosis not present

## 2021-04-16 DIAGNOSIS — Z96611 Presence of right artificial shoulder joint: Secondary | ICD-10-CM | POA: Diagnosis not present

## 2021-04-16 DIAGNOSIS — R079 Chest pain, unspecified: Secondary | ICD-10-CM | POA: Insufficient documentation

## 2021-04-16 DIAGNOSIS — Z87891 Personal history of nicotine dependence: Secondary | ICD-10-CM | POA: Diagnosis not present

## 2021-04-16 DIAGNOSIS — R509 Fever, unspecified: Secondary | ICD-10-CM | POA: Diagnosis not present

## 2021-04-16 DIAGNOSIS — Z79899 Other long term (current) drug therapy: Secondary | ICD-10-CM | POA: Diagnosis not present

## 2021-04-16 DIAGNOSIS — R059 Cough, unspecified: Secondary | ICD-10-CM | POA: Diagnosis not present

## 2021-04-16 DIAGNOSIS — J45909 Unspecified asthma, uncomplicated: Secondary | ICD-10-CM | POA: Insufficient documentation

## 2021-04-16 DIAGNOSIS — R9431 Abnormal electrocardiogram [ECG] [EKG]: Secondary | ICD-10-CM

## 2021-04-16 DIAGNOSIS — I10 Essential (primary) hypertension: Secondary | ICD-10-CM | POA: Insufficient documentation

## 2021-04-16 DIAGNOSIS — Z794 Long term (current) use of insulin: Secondary | ICD-10-CM | POA: Diagnosis not present

## 2021-04-16 DIAGNOSIS — E109 Type 1 diabetes mellitus without complications: Secondary | ICD-10-CM | POA: Insufficient documentation

## 2021-04-16 DIAGNOSIS — R0789 Other chest pain: Secondary | ICD-10-CM | POA: Diagnosis not present

## 2021-04-16 LAB — TESTOSTERONE,FREE AND TOTAL
Testosterone, Free: 6.5 pg/mL — ABNORMAL LOW (ref 6.6–18.1)
Testosterone: 746 ng/dL (ref 264–916)

## 2021-04-16 LAB — BASIC METABOLIC PANEL
Anion gap: 4 — ABNORMAL LOW (ref 5–15)
BUN: 15 mg/dL (ref 8–23)
CO2: 26 mmol/L (ref 22–32)
Calcium: 9.1 mg/dL (ref 8.9–10.3)
Chloride: 105 mmol/L (ref 98–111)
Creatinine, Ser: 1.08 mg/dL (ref 0.61–1.24)
GFR, Estimated: >60 mL/min (ref >60)
Glucose, Bld: 158 mg/dL — ABNORMAL HIGH (ref 70–99)
Potassium: 4 mmol/L (ref 3.5–5.1)
Sodium: 135 mmol/L (ref 135–145)

## 2021-04-16 LAB — CBC
HCT: 51 % (ref 39.0–52.0)
Hemoglobin: 17.1 g/dL — ABNORMAL HIGH (ref 13.0–17.0)
MCH: 35 pg — ABNORMAL HIGH (ref 26.0–34.0)
MCHC: 33.5 g/dL (ref 30.0–36.0)
MCV: 104.3 fL — ABNORMAL HIGH (ref 80.0–100.0)
Platelets: 102 10*3/uL — ABNORMAL LOW (ref 150–400)
RBC: 4.89 MIL/uL (ref 4.22–5.81)
RDW: 14.5 % (ref 11.5–15.5)
WBC: 10 10*3/uL (ref 4.0–10.5)
nRBC: 0 % (ref 0.0–0.2)

## 2021-04-16 LAB — CBG MONITORING, ED: Glucose-Capillary: 161 mg/dL — ABNORMAL HIGH (ref 70–99)

## 2021-04-16 LAB — TROPONIN I (HIGH SENSITIVITY)
Troponin I (High Sensitivity): 26 ng/L — ABNORMAL HIGH (ref <18)
Troponin I (High Sensitivity): 27 ng/L — ABNORMAL HIGH (ref <18)

## 2021-04-16 LAB — PSA: Prostate Specific Ag, Serum: 1.1 ng/mL (ref 0.0–4.0)

## 2021-04-16 MED ORDER — DILTIAZEM HCL ER COATED BEADS 180 MG PO CP24: 180.0000 mg | ORAL_CAPSULE | Freq: Every day | ORAL | 0 refills | Status: DC

## 2021-04-16 MED ORDER — METOPROLOL TARTRATE 5 MG/5ML IV SOLN
5.0000 mg | Freq: Once | INTRAVENOUS | Status: AC
  Administered 2021-04-16: 5 mg via INTRAVENOUS
  Filled 2021-04-16: qty 5

## 2021-04-16 MED ORDER — ALUM & MAG HYDROXIDE-SIMETH 200-200-20 MG/5ML PO SUSP
30.0000 mL | Freq: Once | ORAL | Status: AC
  Administered 2021-04-16: 30 mL via ORAL
  Filled 2021-04-16: qty 30

## 2021-04-16 MED ORDER — ASPIRIN 81 MG PO CHEW
324.0000 mg | CHEWABLE_TABLET | Freq: Once | ORAL | Status: AC
  Administered 2021-04-16: 324 mg via ORAL
  Filled 2021-04-16: qty 4

## 2021-04-16 MED ORDER — HEPARIN BOLUS VIA INFUSION
4000.0000 [IU] | Freq: Once | INTRAVENOUS | Status: AC
  Administered 2021-04-16: 4000 [IU] via INTRAVENOUS

## 2021-04-16 MED ORDER — ALUM & MAG HYDROXIDE-SIMETH 200-200-20 MG/5ML PO SUSP
30.0000 mL | Freq: Once | ORAL | Status: AC
  Administered 2021-04-16: 30 mL via ORAL

## 2021-04-16 MED ORDER — HEPARIN (PORCINE) 25000 UT/250ML-% IV SOLN
1450.0000 [IU]/h | INTRAVENOUS | Status: DC
  Administered 2021-04-16: 1450 [IU]/h via INTRAVENOUS
  Filled 2021-04-16: qty 250

## 2021-04-16 MED ORDER — DILTIAZEM HCL ER COATED BEADS 180 MG PO CP24: 180.0000 mg | ORAL_CAPSULE | Freq: Every day | ORAL | Status: DC

## 2021-04-16 NOTE — ED Provider Notes (Addendum)
[facility] EMERGENCY DEPARTMENT Provider Note   CSN: [id number] Arrival date & time: 04/16/21  1854     History Chief Complaint  Patient presents with   Chest Pain    Tony Cantu is a 70 y.o. male. Patient seen by me as he was walking down the hallway to the back examination area, at 7:28 PM. HPI He presents today for evaluation of mid chest pain, starting at 10 AM this morning while he was watching TV, which is persisted.  Initially it was 7/10 and has persisted, until he was treated with antacid with some improvement.  He has not had this previously.  He follows regularly with cardiology after history of pericarditis remotely.    Past Medical History:  Diagnosis Date   Arthritis    Asthma    BPH (benign prostatic hypertrophy)    Cirrhosis ([place])    Colon polyps    Diabetes mellitus without complication ([place])    TYPE 1    Diverticulosis    Gastric ulcer    Gastric varices    right   Gastritis    GERD (gastroesophageal reflux disease)    Headache    HX   Hypertension    PONV (postoperative nausea and vomiting)    Renal cyst, right    Seizures ([place])    HYPOGLYCEMIC LAST 1 AND 1/2 YRS AGO   Sleep apnea    uses CPAP nightly   Testicle trouble    one testicle BORN WITH   Tubular adenoma of colon     Patient Active Problem List   Diagnosis Date Noted   Former smoker 01/12/2021   PAF (paroxysmal atrial fibrillation) ([place]) 06/22/2020   Hypertension associated with type 2 diabetes mellitus ([place]) 05/17/2019   Hyperlipidemia associated with type 2 diabetes mellitus ([place]) 05/17/2019   GERD with esophagitis 05/17/2019   H/O total shoulder replacement, right 11/01/2018   Paroxysmal tachycardia ([place]) 01/11/2017   Nonrheumatic aortic valve stenosis 01/11/2017   Aortic atherosclerosis ([place]) 11/23/2016   HNP (herniated nucleus pulposus), lumbar 10/06/2016   Acute renal injury ([place]) 08/06/2016   Diarrhea 08/06/2016   Fever 08/06/2016   Hyperbilirubinemia 08/06/2016    Lactic acidosis 08/06/2016   Inguinal hernia 12/10/2015   Right groin pain 11/30/2015   Carpal tunnel syndrome on right 09/09/2015   Cervical spondylosis without myelopathy 09/09/2015   Thrombocytopenia ([place]) 07/21/2014   Bilateral carotid bruits 05/11/2014   Vitamin D deficiency 09/17/2013   BPH (benign prostatic hyperplasia) 05/22/2013   Low serum testosterone level 02/18/2013   Diabetes type 2, controlled ([place]) 01/10/2013   OSA (obstructive sleep apnea) 05/16/2012   Edema 02/21/2012   At risk for coronary artery disease 03/20/2011   Obesity 03/20/2011   Allergic rhinitis 06/22/2010   ASTHMA 06/22/2010   COUGH 06/22/2010    Past Surgical History:  Procedure Laterality Date   BACK SURGERY     71  LOWER    CARPAL TUNNEL RELEASE Right 10/13/2015   Procedure: RIGHT CARPAL TUNNEL RELEASE;  Surgeon: Daryll Brod, MD;  Location: [facility];  Service: Orthopedics;  Laterality: Right;   CARPAL TUNNEL RELEASE Left 07/05/2016   Procedure: LEFT CARPAL TUNNEL RELEASE;  Surgeon: Daryll Brod, MD;  Location: [facility];  Service: Orthopedics;  Laterality: Left;   HERNIA REPAIR     INGUINAL HERNIA REPAIR  2003   right    KNEE ARTHROSCOPY Right 03/03/2016   LUMBAR DISC SURGERY  8/96   Dr. Coralyn Mark, discectomy   LUMBAR  LAMINECTOMY/DECOMPRESSION MICRODISCECTOMY Right 10/06/2016   Procedure: RIGHT LUMBAR THREE - LUMBAR FOUR  LAMINECTOMY, FORAMINOTOMY AND MICRODISCECTOMY;  Surgeon: Jovita Gamma, MD;  Location: [facility];  Service: Neurosurgery;  Laterality: Right;   SHOULDER SURGERY  11/28/05   left partial   SHOULDER SURGERY  07/14/2006   RIGHT   TONSILLECTOMY  AGE 50 OR 4   TOTAL SHOULDER ARTHROPLASTY Right 11/01/2018   Procedure: RIGHT SHOULDER REVISION TO REVERSE TOTAL SHOULDER;  Surgeon: Tania Ade, MD;  Location: [facility];  Service: Orthopedics;  Laterality: Right;  CHOICE ANESTHESIA WITH INTERSCALENE BLOCK EXPAREL, NEEDS RNFA       Family History  Problem  Relation Age of Onset   Heart failure Mother    Heart attack Mother        in her 28's   Alzheimer's disease Mother    Diabetes Mother    Asthma Father    Suicidality Father        in his 25's   Allergic rhinitis Sister    Heart failure Maternal Grandmother    Rheum arthritis Maternal Grandmother    Breast cancer Maternal Grandmother    Heart attack Maternal Grandmother        in her 37's   Colon cancer Neg Hx    Esophageal cancer Neg Hx    Pancreatic cancer Neg Hx    Liver disease Neg Hx     Social History   Tobacco Use   Smoking status: Former    Years: 34.00    Types: Cigarettes, Pipe    Quit date: 2005    Years since quitting: 17.6   Smokeless tobacco: Never   Tobacco comments:    quit 2005 smoked cigarettes for 5 yrs prior to pipe use  Vaping Use   Vaping Use: Never used  Substance Use Topics   Alcohol use: No    Comment: once a year   Drug use: No    Home Medications Prior to Admission medications   Medication Sig Start Date End Date Taking? Authorizing Provider  albuterol (VENTOLIN HFA) 108 (90 Base) MCG/ACT inhaler Inhale 2 puffs into the lungs every 6 (six) hours as needed for wheezing or shortness of breath. 02/18/20  Yes Ronnie Doss M, DO  aspirin 81 MG chewable tablet Chew by mouth daily.   Yes [provider]  atorvastatin (LIPITOR) 40 MG tablet TAKE 1 TABLET BY MOUTH  DAILY 04/15/21  Yes Ronnie Doss M, DO  b complex vitamins tablet Take 1 tablet by mouth daily at 12 noon.   Yes [provider]  Calcium Carbonate-Vitamin D 600-400 MG-UNIT tablet Take 1 tablet by mouth daily.   Yes [provider]  Cholecalciferol (VITAMIN D3) 2000 UNITS capsule Take 2,000 Units by mouth daily at 12 noon.   Yes [provider]  diltiazem (CARDIZEM CD) 180 MG 24 hr capsule Take 1 capsule (180 mg total) by mouth daily. 04/16/21  Yes Daleen Bo, MD  furosemide (LASIX) 20 MG tablet TAKE 1 AND 1/2 TABLETS BY  MOUTH DAILY 01/26/21   Yes Gottschalk, Ashly M, DO  Glucagon, rDNA, (GLUCAGON EMERGENCY) 1 MG KIT Inject 1 mg into the skin Once PRN for up to 1 dose. 05/15/20  Yes Hendricks Limes F, FNP  Glucosamine-Chondroit-Vit C-Mn (GLUCOSAMINE 1500 COMPLEX) CAPS Take 2 capsules by mouth daily at 12 noon.   Yes [provider]  insulin lispro (HUMALOG) 100 UNIT/ML injection INJECT SUBCUTANEOUSLY 20 TO 50 UNITS 3 TIMES DAILY  BEFORE MEALS PER SLIDING  SCALE  Patient taking differently: 6-30 Units. INJECT SUBCUTANEOUSLY 6-30 UNITS 7-8 TIMES DAILY  BEFORE MEALS PER SLIDING  SCALE 05/14/20  Yes Gottschalk, Ashly M, DO  montelukast (SINGULAIR) 10 MG tablet TAKE 1 TABLET BY MOUTH AT  BEDTIME 10/19/20  Yes Gottschalk, Ashly M, DO  multivitamin-lutein (OCUVITE-LUTEIN) CAPS capsule Take 1 capsule by mouth daily at 12 noon.   Yes [provider]  pantoprazole (PROTONIX) 40 MG tablet TAKE 1 TABLET BY MOUTH  DAILY 01/07/21  Yes Gottschalk, Ashly M, DO  Testosterone 30 MG/ACT SOLN APPLY ONE PUMP TOPICALLY  UNDER EACH AXILLA DAILY  (TOTAL OF 60MG DAILY ) 09/18/20  Yes Gottschalk, Ashly M, DO  TOUJEO MAX SOLOSTAR 300 UNIT/ML Solostar Pen INJECT 110 UNITS INTO THE  SKIN DAILY Patient taking differently: Inject 80 Units into the skin daily. 8 pm 04/01/21  Yes Gottschalk, Ashly M, DO  valsartan (DIOVAN) 160 MG tablet TAKE 1 TABLET BY MOUTH  DAILY Patient taking differently: Take 80 mg by mouth daily. 04/15/21  Yes Gottschalk, Ashly M, DO  benzonatate (TESSALON) 200 MG capsule Take 1 capsule (200 mg total) by mouth 3 (three) times daily as needed for cough. Patient not taking: No sig reported 08/06/20   Claretta Fraise, MD  Blood Glucose Monitoring Suppl (CONTOUR NEXT ONE) KIT Test BS QID and as needed Dx E11.9 06/04/20   [name], DO  Continuous Blood Gluc Receiver (FREESTYLE LIBRE READER) DEVI 1 applicator by Does not apply route as directed. 08/10/17   Chipper Herb, MD  Continuous Blood Gluc Sensor (FREESTYLE LIBRE SENSOR SYSTEM)  MISC Check BS eight (8) times a day. Dx E10.9 03/17/21   Ronnie Doss M, DO  diazepam (VALIUM) 5 MG tablet Take by mouth. Patient not taking: No sig reported 10/27/20   [provider]  fexofenadine (ALLEGRA) 180 MG tablet Take 1 tablet (180 mg total) by mouth daily with breakfast. Patient not taking: Reported on 04/16/2021 11/16/16   Chipper Herb, MD  glucose blood [facility] ULTRA) test strip Use as needed E11.9 06/01/20   [name], DO  HYDROcodone-acetaminophen (NORCO/VICODIN) 5-325 MG tablet Take 1 tablet by mouth every 6 (six) hours as needed. for pain Patient not taking: No sig reported 11/27/20   [provider]  Microlet Lancets MISC Test BS QID and as needed Dx E11.9 06/04/20   Ronnie Doss M, DO  pantoprazole (PROTONIX) 40 MG tablet Take 1 tablet by mouth daily. Patient not taking: No sig reported 01/07/21   [provider]  sildenafil (REVATIO) 20 MG tablet TAKE 2-5 TABLETS AS NEEDED PRIOR TO SEXUAL ACTIVITY Patient not taking: No sig reported 01/29/19   Chipper Herb, MD    Allergies    Erythromycin and Sulfonamide derivatives  Review of Systems   Review of Systems  Physical Exam Updated Vital Signs BP 131/70   Pulse 80   Temp 99.1 F (37.3 C) (Oral)   Resp (!) 25   Ht _0  (1.778 m)   Wt 106.1 kg   SpO2 94%   BMI 33.58 kg/m   Physical Exam  ED Results / Procedures / Treatments   Labs (all labs ordered are listed, but only abnormal results are displayed) Labs Reviewed  BASIC METABOLIC PANEL - Abnormal; Notable for the following components:      Result Value   Glucose, Bld 158 (*)    Anion gap 4 (*)    All other components within normal limits  CBC - Abnormal; Notable for the following  components:   Hemoglobin 17.1 (*)    MCV 104.3 (*)    MCH 35.0 (*)    Platelets 102 (*)    All other components within normal limits  CBG MONITORING, ED - Abnormal; Notable for the following components:   Glucose-Capillary 161 (*)     All other components within normal limits  TROPONIN I (HIGH SENSITIVITY) - Abnormal; Notable for the following components:   Troponin I (High Sensitivity) 26 (*)    All other components within normal limits  TROPONIN I (HIGH SENSITIVITY) - Abnormal; Notable for the following components:   Troponin I (High Sensitivity) 27 (*)    All other components within normal limits  HEPARIN LEVEL (UNFRACTIONATED)  CBC    EKG EKG Interpretation  Date/Time:  Friday April 16 2021 20:03:37 EDT Ventricular Rate:  89 PR Interval:  139 QRS Duration: 147 QT Interval:  409 QTC Calculation: 392 R Axis:   38 Text Interpretation: Sinus rhythm Multiple premature complexes, vent & supraven Right bundle branch block Inferior infarct, acute Lateral leads are also involved >>> Acute MI <<< Since last tracing of earlier today No significant change was found Confirmed by Daleen Bo [id number]) on 04/16/2021 10:42:17 PM  Radiology DG Chest Portable 1 View  Result Date: 04/16/2021 CLINICAL DATA:  Chest pain. sternal chestpain that began this morning. Pt reports cough and fever at home EXAM: PORTABLE CHEST 1 VIEW COMPARISON:  Chest x-ray 01/12/2021, CT chest 02/20/2019 FINDINGS: The heart and mediastinal contours are unchanged. Aortic calcification. No focal consolidation. Similar-appearing increased interstitial markings with no overt pulmonary edema. No pleural effusion. No pneumothorax. No acute osseous abnormality. Bilateral reverse total shoulder arthroplasty. IMPRESSION: No active disease. Electronically Signed   By: Iven Finn M.D.   On: 04/16/2021 20:14    Procedures .Critical Care  Date/Time: 04/16/2021 11:52 PM Performed by: Daleen Bo, MD Authorized by: Daleen Bo, MD   Critical care provider statement:    Critical care time (minutes):  28   Critical care start time:  04/16/2021 7:28 PM   Critical care end time:  04/16/2021 11:52 PM   Critical care time was exclusive of:  Separately  billable procedures and treating other patients   Critical care was necessary to treat or prevent imminent or life-threatening deterioration of the following conditions:  Cardiac failure   Critical care was time spent personally by me on the following activities:  Blood draw for specimens, development of treatment plan with patient or surrogate, discussions with consultants, evaluation of patient's response to treatment, examination of patient, obtaining history from patient or surrogate, ordering and performing treatments and interventions, ordering and review of laboratory studies, pulse oximetry, re-evaluation of patient's condition, review of old charts and ordering and review of radiographic studies   Medications Ordered in ED Medications  heparin ADULT infusion 100 units/mL (25000 units/263m) (1,450 Units/hr Intravenous New Bag/Given 04/16/21 2042)  diltiazem (CARDIZEM CD) 24 hr capsule 180 mg (has no administration in time range)  aspirin chewable tablet 324 mg (324 mg Oral Given 04/16/21 1941)  metoprolol tartrate (LOPRESSOR) injection 5 mg (5 mg Intravenous Given 04/16/21 1951)  heparin bolus via infusion 4,000 Units (4,000 Units Intravenous Bolus from Bag 04/16/21 2037)  alum & mag hydroxide-simeth (MAALOX/MYLANTA) 200-200-20 MG/5ML suspension 30 mL (30 mLs Oral Given 04/16/21 2343)    ED Course  I have reviewed the triage vital signs and the nursing notes.  Pertinent labs & imaging results that were available during my care of the patient were reviewed by  me and considered in my medical decision making (see chart for details).  Clinical Course as of 04/16/21 2351  Fri Apr 16, 2021  1928 Nurses walking the patient back to the examination area and hands me the EKG which indicates lead to ST elevation, tracing otherwise unchanged from prior.  Patient seen earlier today at urgent care given a GI cocktail with some improvement of his chest discomfort.  We will contact cardiology, STEMI doctor  to discuss this tracing. [EW]  1952 Case discussed with Dr. Einar Gip, regarding abnormal EKG.  He suggests slowing heart rate down with metoprolol and repeating EKG. [EW]  1958 First Lopressor dose, lowered heart rate to 79.  Blood pressure 166 systolic, will repeat 2.5 mg Lopressor then repeat a twelve-lead EKG. [EW]  2009 Per Gangi, await Troponin I, then decide if he needs urgent catheterization.  In the meantime, start heparin, appropriate dose.  He has already received aspirin. [EW]  2029 First troponin, 26.  This is reassuring considering his pain started at 10 AM.  Nonspecific elevation.  Will await delta troponin, then disposition to admit here in [place], versus transfer to [facility] if the troponin is rising rapidly. [EW]  2205 I discussed the abnormal EKG with Dr. Einar Gip, who recommends slowing heart rate down, and reassess EKG along with troponin testing. [EW]  2315 Patient continues to have 1/10 chest pain, unchanging since initial treatment with antacid.  I discussed the case again with Dr. Einar Gip who states patient can be discharged, to follow-up with cardiology as an outpatient. [EW]    Clinical Course User Index [EW] Daleen Bo, MD   MDM Rules/Calculators/A&P                            Patient Vitals for the past 24 hrs:  BP Temp Temp src Pulse Resp SpO2 Height Weight  04/16/21 2145 -- -- -- 80 (!) 25 94 % -- --  04/16/21 2130 131/70 -- -- 81 14 96 % -- --  04/16/21 2115 -- -- -- 78 (!) 27 95 % -- --  04/16/21 2100 107/69 -- -- 78 16 95 % -- --  04/16/21 2047 107/65 99.1 F (37.3 C) Oral 81 18 96 % -- --  04/16/21 2045 -- -- -- 84 (!) 21 96 % -- --  04/16/21 2030 107/65 -- -- 74 20 95 % -- --  04/16/21 2000 108/67 -- -- 72 18 97 % -- --  04/16/21 1933 -- -- -- -- -- -- _0  (1.778 m) 106.1 kg  04/16/21 1932 (!) 121/51 98.8 F (37.1 C) Oral 89 18 96 % -- --    11:49 PM Reevaluation with update and discussion. After initial assessment and treatment, an updated evaluation  reveals patient remains comfortable.  I discussed the findings with him and his wife by telephone, she will come and get him.  All questions were answered. Daleen Bo   Medical Decision Making:  This patient is presenting for evaluation of chest pain, which does require a range of treatment options, and is a complaint that involves a high risk of morbidity and mortality. The differential diagnoses include ACS, pericarditis, nonspecific chest pain, GI disorder. I decided to review old records, and in summary elderly male, with history of pericarditis remotely, presenting for evaluation of chest pain.  I did not require additional historical information from anyone.  Clinical Laboratory Tests Ordered, included CBC, Metabolic panel, and troponin I . Review indicates initial  troponin elevated at 26.. Radiologic Tests Ordered, included chest x-ray.  I independently Visualized: Radiograph images, which show no acute abnormality  Cardiac Monitor Tracing which shows sinus rhythm     Critical Interventions-clinical evaluation, initial EKG laboratory testing, repeat EKG x2, treatment with Lopressor to slow rate for verification of EKG tracing, repeat labs, observation and reassessment  After These Interventions, the Patient was reevaluated and was found stable for discharge.  Patient with nonspecific chest pain, improved after taking antacid, with stable unchanging mildly elevated troponin in the ED.  Suspect GI source for discomfort.  Patient can be managed in the outpatient setting, will give prescription for Cardizem CD, have him increase his Protonix to 80 mg twice a day, and follow-up with his cardiologist in [place] Performed by: Daleen Bo  Nursing Notes Reviewed/ Care Coordinated Applicable Imaging Reviewed Interpretation of Laboratory Data incorporated into ED treatment  The patient appears reasonably screened and/or stabilized for discharge and I doubt any  other medical condition or other [facility] requiring further screening, evaluation, or treatment in the ED at this time prior to discharge.  Plan: Home Medications-take antacid before meals and at bedtime, increase Protonix continue other medications; Home Treatments-rest, fluids; return here if the recommended treatment, does not improve the symptoms; Recommended follow up-cardiology as soon as possible, GI in 1 to 2 weeks, PCP, as needed     Final Clinical Impression(s) / ED Diagnoses Final diagnoses:  Nonspecific chest pain    Rx / DC Orders ED Discharge Orders          Ordered    diltiazem (CARDIZEM CD) 180 MG 24 hr capsule  Daily        04/16/21 2343             Daleen Bo, MD 04/16/21 2351    Daleen Bo, MD 04/16/21 2352

## 2021-04-16 NOTE — ED Triage Notes (Signed)
Pt arrived via POV from [facility] c/o sternal chestpain that began this morning. Pt reports cough and fever at home. Pt reports taking TUMS and Tylenol at home. Pt received GI Cocktail at [facility] with minimal relief of chest pain.

## 2021-04-16 NOTE — ED Notes (Signed)
Patient is being discharged from the Urgent Care and sent to the Emergency Department via pov . Per Lestine Box, [place]  patient is in need of higher level of care due to chest pain. Patient is aware and verbalizes understanding of plan of care.  Vitals:   04/16/21 1818 04/16/21 1821  BP: 135/62   Pulse:  97  Resp: 18   Temp: 99 F (37.2 C)   SpO2: 92%

## 2021-04-16 NOTE — ED Provider Notes (Signed)
Icard   [id number] 04/16/21 Arrival Time: 1744   CC: CHEST PAIN Abbreviated note SUBJECTIVE:  JAIDEEP POLLACK is a 70 y.o. male who presents with complaint of chest pain that began this morning.  Admits to productive cough and fever,   Localizes chest pain to the substernal region.  Describes as intermittent and sharp in character.  Has tried TUMs without relief.  Denies aggravating factors.  Reports previous symptoms in the past heart attack or pericarditis in the 1970's.  Denies SOB, nausea, vomiting  Home covid test negative.    ROS: As per HPI.  All other pertinent ROS negative.    Past Medical History:  Diagnosis Date   Arthritis    Asthma    BPH (benign prostatic hypertrophy)    Cirrhosis (HCC)    Colon polyps    Diabetes mellitus without complication ([place])    TYPE 1    Diverticulosis    Gastric ulcer    Gastric varices    right   Gastritis    GERD (gastroesophageal reflux disease)    Headache    HX   Hypertension    PONV (postoperative nausea and vomiting)    Renal cyst, right    Seizures (HCC)    HYPOGLYCEMIC LAST 1 AND 1/2 YRS AGO   Sleep apnea    uses CPAP nightly   Testicle trouble    one testicle BORN WITH   Tubular adenoma of colon    Past Surgical History:  Procedure Laterality Date   BACK SURGERY     63  LOWER    CARPAL TUNNEL RELEASE Right 10/13/2015   Procedure: RIGHT CARPAL TUNNEL RELEASE;  Surgeon: Daryll Brod, MD;  Location: [place];  Service: Orthopedics;  Laterality: Right;   CARPAL TUNNEL RELEASE Left 07/05/2016   Procedure: LEFT CARPAL TUNNEL RELEASE;  Surgeon: Daryll Brod, MD;  Location: [place];  Service: Orthopedics;  Laterality: Left;   HERNIA REPAIR     INGUINAL HERNIA REPAIR  2003   right    KNEE ARTHROSCOPY Right 03/03/2016   LUMBAR DISC SURGERY  8/96   Dr. Coralyn Mark, discectomy   LUMBAR LAMINECTOMY/DECOMPRESSION MICRODISCECTOMY Right 10/06/2016   Procedure: RIGHT LUMBAR THREE -  LUMBAR FOUR  LAMINECTOMY, FORAMINOTOMY AND MICRODISCECTOMY;  Surgeon: Jovita Gamma, MD;  Location: [place];  Service: Neurosurgery;  Laterality: Right;   SHOULDER SURGERY  11/28/05   left partial   SHOULDER SURGERY  07/14/2006   RIGHT   TONSILLECTOMY  AGE 89 OR 4   TOTAL SHOULDER ARTHROPLASTY Right 11/01/2018   Procedure: RIGHT SHOULDER REVISION TO REVERSE TOTAL SHOULDER;  Surgeon: Tania Ade, MD;  Location: [facility];  Service: Orthopedics;  Laterality: Right;  CHOICE ANESTHESIA WITH INTERSCALENE BLOCK EXPAREL, NEEDS RNFA   Allergies  Allergen Reactions   Erythromycin Diarrhea   Sulfonamide Derivatives Diarrhea   No current facility-administered medications on file prior to encounter.   Current Outpatient Medications on File Prior to Encounter  Medication Sig Dispense Refill   albuterol (VENTOLIN HFA) 108 (90 Base) MCG/ACT inhaler Inhale 2 puffs into the lungs every 6 (six) hours as needed for wheezing or shortness of breath. (Patient not taking: No sig reported) 6.7 g 0   aspirin 81 MG chewable tablet Chew by mouth daily.     atorvastatin (LIPITOR) 40 MG tablet TAKE 1 TABLET BY MOUTH  DAILY 90 tablet 0   b complex vitamins tablet Take 1 tablet by mouth daily at 12 noon.  benzonatate (TESSALON) 200 MG capsule Take 1 capsule (200 mg total) by mouth 3 (three) times daily as needed for cough. (Patient not taking: No sig reported) 20 capsule 0   Blood Glucose Monitoring Suppl (CONTOUR NEXT ONE) KIT Test BS QID and as needed Dx E11.9 500 kit 3   Calcium Carbonate-Vitamin D 600-400 MG-UNIT tablet Take 1 tablet by mouth daily.     Cholecalciferol (VITAMIN D3) 2000 UNITS capsule Take 2,000 Units by mouth daily at 12 noon.     Continuous Blood Gluc Receiver (FREESTYLE LIBRE READER) DEVI 1 applicator by Does not apply route as directed. 1 Device 1   Continuous Blood Gluc Sensor (FREESTYLE LIBRE SENSOR SYSTEM) MISC Check BS eight (8) times a day. Dx E10.9 3 each 3   fexofenadine (ALLEGRA) 180 MG  tablet Take 1 tablet (180 mg total) by mouth daily with breakfast. 90 tablet 3   furosemide (LASIX) 20 MG tablet TAKE 1 AND 1/2 TABLETS BY  MOUTH DAILY 135 tablet 1   Glucagon, rDNA, (GLUCAGON EMERGENCY) 1 MG KIT Inject 1 mg into the skin Once PRN for up to 1 dose. 3 kit 1   Glucosamine-Chondroit-Vit C-Mn (GLUCOSAMINE 1500 COMPLEX) CAPS Take 2 capsules by mouth daily at 12 noon.     glucose blood (ONETOUCH ULTRA) test strip Use as needed E11.9 200 each 12   insulin lispro (HUMALOG) 100 UNIT/ML injection INJECT SUBCUTANEOUSLY 20 TO 50 UNITS 3 TIMES DAILY  BEFORE MEALS PER SLIDING  SCALE 140 mL 3   Microlet Lancets MISC Test BS QID and as needed Dx E11.9 500 each 3   montelukast (SINGULAIR) 10 MG tablet TAKE 1 TABLET BY MOUTH AT  BEDTIME 90 tablet 3   multivitamin-lutein (OCUVITE-LUTEIN) CAPS capsule Take 1 capsule by mouth daily at 12 noon.     pantoprazole (PROTONIX) 40 MG tablet TAKE 1 TABLET BY MOUTH  DAILY 90 tablet 0   sildenafil (REVATIO) 20 MG tablet TAKE 2-5 TABLETS AS NEEDED PRIOR TO SEXUAL ACTIVITY 50 tablet 1   Testosterone 30 MG/ACT SOLN APPLY ONE PUMP TOPICALLY  UNDER EACH AXILLA DAILY  (TOTAL OF 60MG DAILY ) 270 mL 1   TOUJEO MAX SOLOSTAR 300 UNIT/ML Solostar Pen INJECT 110 UNITS INTO THE  SKIN DAILY 36 mL 0   valsartan (DIOVAN) 160 MG tablet TAKE 1 TABLET BY MOUTH  DAILY 90 tablet 0   Social History   Socioeconomic History   Marital status: Married    Spouse name: Not on file   Number of children: Not on file   Years of education: Not on file   Highest education level: Not on file  Occupational History   Occupation: retired   Tobacco Use   Smoking status: Former    Years: 34.00    Types: Cigarettes, Pipe    Quit date: 2005    Years since quitting: 17.6   Smokeless tobacco: Never   Tobacco comments:    quit 2005 smoked cigarettes for 5 yrs prior to pipe use  Vaping Use   Vaping Use: Never used  Substance and Sexual Activity   Alcohol use: No    Comment: once a year    Drug use: No   Sexual activity: Yes  Other Topics Concern   Not on file  Social History Narrative   Not on file   Social Determinants of Health   Financial Resource Strain: Not on file  Food Insecurity: Not on file  Transportation Needs: Not on file  Physical Activity: Not on  file  Stress: Not on file  Social Connections: Not on file  Intimate Partner Violence: Not on file   Family History  Problem Relation Age of Onset   Heart failure Mother    Heart attack Mother        in her 60's   Alzheimer's disease Mother    Diabetes Mother    Asthma Father    Suicidality Father        in his 44's   Allergic rhinitis Sister    Heart failure Maternal Grandmother    Rheum arthritis Maternal Grandmother    Breast cancer Maternal Grandmother    Heart attack Maternal Grandmother        in her 81's   Colon cancer Neg Hx    Esophageal cancer Neg Hx    Pancreatic cancer Neg Hx    Liver disease Neg Hx      OBJECTIVE:  Vitals:   04/16/21 1818 04/16/21 1821  BP: 135/62   Pulse:  97  Resp: 18   Temp: 99 F (37.2 C)   TempSrc: Oral   SpO2: 92%     General appearance: alert; no distress Eyes: PERRLA; EOMI; conjunctiva normal HENT: normocephalic; atraumatic; nares patent; oroapharynx clear Neck: supple Lungs: clear to auscultation bilaterally without adventitious breath sounds Heart: murmur Extremities: no cyanosis or edema; symmetrical with no gross deformities Skin: warm and dry Psychological: alert and cooperative; normal mood and affect  ECG: Orders placed or performed during the hospital encounter of 04/16/21   ED EKG   ED EKG   EKG normal sinus rhythm with frequent PVCs.  EKG concerning for STEMI or acute MI.  Talked to Dr. Lanny Cramp, my supervising physician, and he reviewed EKG.  He stated there were no ST elevations, but some inverted t-waves.    ASSESSMENT & PLAN:  1. Chest pain, unspecified type   2. Nonspecific abnormal electrocardiogram (ECG) (EKG)      Meds ordered this encounter  Medications   alum & mag hydroxide-simeth (MAALOX/MYLANTA) 200-200-20 MG/5ML suspension 30 mL   Recommending further evaluation and management in the ED for chest pain with abnormal EKG.  Discussed patient case with SP Dr. Lanny Cramp.  SP did not recommend acute interventions or EMS transport.  Patient hemodynamically stable.  Patient discharged in stable condition with family.  Will travel by private vehicle to [facility] ED.     Lestine Box, PA-C 04/16/21 1916

## 2021-04-16 NOTE — Progress Notes (Signed)
ANTICOAGULATION CONSULT NOTE - Follow Up Consult  Pharmacy Consult for Heparin Indication: chest pain/ACS  Allergies  Allergen Reactions   Erythromycin Diarrhea   Sulfonamide Derivatives Diarrhea    Patient Measurements: Height: 5' 10"  (177.8 cm) Weight: 106.1 kg (234 lb) IBW/kg (Calculated) : 73 Heparin Dosing Weight: 98  Vital Signs: Temp: 98.8 F (37.1 C) (08/19 1932) Temp Source: Oral (08/19 1932) BP: 108/67 (08/19 2000) Pulse Rate: 72 (08/19 2000)  Labs: Recent Labs    04/16/21 1940  HGB 17.1*  HCT 51.0  PLT 102*    CrCl cannot be calculated (Patient's most recent lab result is older than the maximum 21 days allowed.).  Assessment: 70 year old male to begin heparin for chest pain  Goal of Therapy:  Heparin level 0.3-0.7 units/ml Monitor platelets by anticoagulation protocol: Yes   Plan:  Heparin 4000 units iv bolus x 1 Heparin drip at 1450 units / hr Heparin level and CBC 6 hours after heparin begins Daily heparin level, CBC  Thank you  Anette Guarneri, PharmD  04/16/2021,8:14 PM

## 2021-04-16 NOTE — Discharge Instructions (Addendum)
We are starting you on Cardizem a medication to keep your heart rate lower.  This may help improve your condition.  We also recommend doubling up on your pantoprazole to 80 mg each day.  Additionally take an antacid such as Maalox, 2 tablespoons before meals and at bedtime, for at least a week.  Call your GI doctor for a follow-up appointment for further care and evaluation.

## 2021-04-16 NOTE — ED Triage Notes (Addendum)
Fever, cough and chest congestion.  Pt reports some chest discomfort earlier today that was worse with cough.  Reports coughing up green mucous at times.

## 2021-04-17 ENCOUNTER — Telehealth: Payer: Self-pay | Admitting: Cardiology

## 2021-04-17 NOTE — Telephone Encounter (Signed)
I was on-call for unassigned STEMI on 04/16/2021.  Patient came in with crushing chest pain, was seen in the urgent care, chest pain symptoms improved with GI cocktail but because of tachycardia and mild chest discomfort he was sent over to [facility] EKG revealed inferior early repolarization and frequent PVCs and tachycardia.  Cardiac troponins were flat and he was chest pain-free.  With slowing the heart rate, with beta-blockers, EKG again revealed early repolarization but no reciprocal ST depressions and patient was completely asymptomatic and wished to go home.  I felt comfortable for the patient to go home, I am forwarding the message so he can arrange for post ED visit follow-up with you.  They did not realize that you are his primary cardiologist until I started talking to them but decided to take on the responsibility as they already had dug into his chart.   Adrian Prows, MD, [facility] 04/17/2021, 12:05 AM Office: [phone] Fax: [phone] Pager: [phone]

## 2021-04-19 ENCOUNTER — Telehealth: Payer: Self-pay | Admitting: Internal Medicine

## 2021-04-19 DIAGNOSIS — I499 Cardiac arrhythmia, unspecified: Secondary | ICD-10-CM

## 2021-04-19 HISTORY — DX: Cardiac arrhythmia, unspecified: I49.9

## 2021-04-19 NOTE — Telephone Encounter (Signed)
Returned call to Pt.  Pt scheduled for follow up visit with Dr. Percival Spanish this Wednesday 04/21/2021 at 1:30 pm per Dr. Percival Spanish.  Pt aware.

## 2021-04-19 NOTE — Telephone Encounter (Signed)
  Pt said he received a call from Dr. Jillyn Ledger nurse and he is calling back

## 2021-04-19 NOTE — Telephone Encounter (Signed)
Pt's wife [name] called looking for an appt for pt. He was seem at [facility] ED this past weekend and was deferred to see his GI. [name] states that he still has severe chest and abd pain and he cannot hold anything down. He has taken GI cocktails but they did not work and he has been experiencing fever. 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 Pls call pt directly to schedule appt.

## 2021-04-19 NOTE — Telephone Encounter (Signed)
Pt stated currently that he is  not having any chest or abdominal pain at the moment. Pt stated that he had chest pain, abdominal pain along with left arm pain starting on Friday and symptoms lasted some over the weekend. Pt was seen in the ER on Friday evening. Pt was scheduled an appointment with Nicoletta Ba on 04/22/21 @ 9:30. Pt was encouraged if he started experiencing any chest pain or left arm pain to contact his PCP or go to the local ER.

## 2021-04-20 DIAGNOSIS — R072 Precordial pain: Secondary | ICD-10-CM | POA: Insufficient documentation

## 2021-04-20 NOTE — Progress Notes (Signed)
`      Cardiology Office Note   Date:  04/21/2021   ID:  Tony Cantu, DOB [birth year], MRN [id number]  PCP:  [name], DO  Cardiologist:   Minus Breeding, MD  Referring:  [name], DO  Chief Complaint  Patient presents with   Chest Pain       History of Present Illness: Tony Cantu is a 70 y.o. male who presents for evaluation of arrhythmia.   He was previously seen by Dr. Aundra Dubin.   He had a cardiolite in 2009 that was a normal study.  He uses CPAP for his sleep apnea. He has mild carotid disease.  The patient was admitted previously  with sepsis.  He had new onset atrial fib with RVR.  He was treated with Eliquis. Marland Kitchen  However, I stopped this as this appeared to be an isolated event.   Since I last saw him he was at [facility] ED with chest pain.  I reviewed these records for this visit.  There were no acute EKG changes though he had some early repolarization.  Cardiac troponins were minimally elevated but flat trending.  He was treated with GI cocktail.  He became pain-free.  He is here for follow-up.     He reports that his symptoms are substernal discomfort that he had last Friday when he presented to the emergency room.  He said it has been preceded by episodic diarrhea that he has been having.  He did not notice any nausea or vomiting.  His wife says he has been "living on GI cocktail."  He is not having any new shortness of breath, PND or orthopnea.  There is no new presyncope or syncope.  However, he has had his heart racing.  He was given Cardizem in the emergency room.  This was apparently for treatment of tachycardia although I do not see a mention of atrial fibrillation.  He said that he took 2 doses of the 180 mg daily but he became bradycardic heart rates in the 30s and felt very weak so he stopped taking this after 2 days.  Today he presents and his heart rate is increased to 131 and appears to be in atrial fibrillation on his EKG.  He has not really feel  this but does note on monitoring that his heart rates been elevated.  He is not having any presyncope or syncope.  He is no longer having that same chest discomfort though he had some soreness in his shoulder that he is used [employer] on.  He has an appointment with GI tomorrow.   Past Medical History:  Diagnosis Date   Arthritis    Asthma    BPH (benign prostatic hypertrophy)    Cirrhosis (HCC)    Colon polyps    Diabetes mellitus without complication (HCC)    TYPE 1    Diverticulosis    Gastric ulcer    Gastric varices    right   Gastritis    GERD (gastroesophageal reflux disease)    Headache    HX   Hypertension    PONV (postoperative nausea and vomiting)    Renal cyst, right    Seizures (HCC)    HYPOGLYCEMIC LAST 1 AND 1/2 YRS AGO   Sleep apnea    uses CPAP nightly   Testicle trouble    one testicle BORN WITH   Tubular adenoma of colon     Past Surgical History:  Procedure Laterality Date  BACK SURGERY     89  LOWER    CARPAL TUNNEL RELEASE Right 10/13/2015   Procedure: RIGHT CARPAL TUNNEL RELEASE;  Surgeon: Daryll Brod, MD;  Location: [place];  Service: Orthopedics;  Laterality: Right;   CARPAL TUNNEL RELEASE Left 07/05/2016   Procedure: LEFT CARPAL TUNNEL RELEASE;  Surgeon: Daryll Brod, MD;  Location: [place];  Service: Orthopedics;  Laterality: Left;   HERNIA REPAIR     INGUINAL HERNIA REPAIR  2003   right    KNEE ARTHROSCOPY Right 03/03/2016   LUMBAR DISC SURGERY  8/96   Dr. Coralyn Mark, discectomy   LUMBAR LAMINECTOMY/DECOMPRESSION MICRODISCECTOMY Right 10/06/2016   Procedure: RIGHT LUMBAR THREE - LUMBAR FOUR  LAMINECTOMY, FORAMINOTOMY AND MICRODISCECTOMY;  Surgeon: Jovita Gamma, MD;  Location: [place];  Service: Neurosurgery;  Laterality: Right;   SHOULDER SURGERY  11/28/05   left partial   SHOULDER SURGERY  07/14/2006   RIGHT   TONSILLECTOMY  AGE 89 OR 4   TOTAL SHOULDER ARTHROPLASTY Right 11/01/2018   Procedure: RIGHT SHOULDER  REVISION TO REVERSE TOTAL SHOULDER;  Surgeon: Tania Ade, MD;  [facility];  Service: Orthopedics;  Laterality: Right;  CHOICE ANESTHESIA WITH INTERSCALENE BLOCK EXPAREL, NEEDS RNFA     Current Outpatient Medications  Medication Sig Dispense Refill   Continuous Blood Gluc Sensor (FREESTYLE LIBRE SENSOR SYSTEM) MISC Check BS eight (8) times a day. Dx E10.9 3 each 3   furosemide (LASIX) 20 MG tablet TAKE 1 AND 1/2 TABLETS BY  MOUTH DAILY 135 tablet 1   Glucagon, rDNA, (GLUCAGON EMERGENCY) 1 MG KIT Inject 1 mg into the skin Once PRN for up to 1 dose. 3 kit 1   Glucosamine-Chondroit-Vit C-Mn (GLUCOSAMINE 1500 COMPLEX) CAPS Take 2 capsules by mouth daily at 12 noon.     glucose blood (ONETOUCH ULTRA) test strip Use as needed E11.9 200 each 12   insulin lispro (HUMALOG) 100 UNIT/ML injection INJECT SUBCUTANEOUSLY 20 TO 50 UNITS 3 TIMES DAILY  BEFORE MEALS PER SLIDING  SCALE (Patient taking differently: 6-30 Units. INJECT SUBCUTANEOUSLY 6-30 UNITS 7-8 TIMES DAILY  BEFORE MEALS PER SLIDING  SCALE) 140 mL 3   Microlet Lancets MISC Test BS QID and as needed Dx E11.9 500 each 3   montelukast (SINGULAIR) 10 MG tablet TAKE 1 TABLET BY MOUTH AT  BEDTIME 90 tablet 3   multivitamin-lutein (OCUVITE-LUTEIN) CAPS capsule Take 1 capsule by mouth daily at 12 noon.     pantoprazole (PROTONIX) 40 MG tablet TAKE 1 TABLET BY MOUTH  DAILY 90 tablet 0   sildenafil (REVATIO) 20 MG tablet TAKE 2-5 TABLETS AS NEEDED PRIOR TO SEXUAL ACTIVITY 50 tablet 1   Testosterone 30 MG/ACT SOLN APPLY ONE PUMP TOPICALLY  UNDER EACH AXILLA DAILY  (TOTAL OF 60MG DAILY ) 270 mL 1   TOUJEO MAX SOLOSTAR 300 UNIT/ML Solostar Pen INJECT 110 UNITS INTO THE  SKIN DAILY (Patient taking differently: Inject 80 Units into the skin daily. 8 pm) 36 mL 0   valsartan (DIOVAN) 160 MG tablet TAKE 1 TABLET BY MOUTH  DAILY (Patient taking differently: Take 80 mg by mouth daily.) 90 tablet 0   albuterol (VENTOLIN HFA) 108 (90 Base) MCG/ACT inhaler  Inhale 2 puffs into the lungs every 6 (six) hours as needed for wheezing or shortness of breath. 6.7 g 0   aspirin 81 MG chewable tablet Chew by mouth daily.     atorvastatin (LIPITOR) 40 MG tablet TAKE 1 TABLET BY MOUTH  DAILY 90  tablet 0   b complex vitamins tablet Take 1 tablet by mouth daily at 12 noon.     benzonatate (TESSALON) 200 MG capsule Take 1 capsule (200 mg total) by mouth 3 (three) times daily as needed for cough. (Patient not taking: No sig reported) 20 capsule 0   Blood Glucose Monitoring Suppl (CONTOUR NEXT ONE) KIT Test BS QID and as needed Dx E11.9 500 kit 3   Calcium Carbonate-Vitamin D 600-400 MG-UNIT tablet Take 1 tablet by mouth daily.     Cholecalciferol (VITAMIN D3) 2000 UNITS capsule Take 2,000 Units by mouth daily at 12 noon.     Continuous Blood Gluc Receiver (FREESTYLE LIBRE READER) DEVI 1 applicator by Does not apply route as directed. 1 Device 1   diltiazem (CARDIZEM) 30 MG tablet Take 1 tablet (30 mg total) by mouth 2 (two) times daily. 180 tablet 3   fexofenadine (ALLEGRA) 180 MG tablet Take 1 tablet (180 mg total) by mouth daily with breakfast. (Patient not taking: No sig reported) 90 tablet 3   No current facility-administered medications for this visit.    Allergies:   Erythromycin and Sulfonamide derivatives    ROS:  Please see the history of present illness.   Otherwise, review of systems are positive for none.   All other systems are reviewed and negative.    PHYSICAL EXAM: VS:  BP 124/72   Pulse (!) 131   Ht _0  (1.778 m)   Wt 237 lb (107.5 kg)   SpO2 96%   BMI 34.01 kg/m  , BMI Body mass index is 34.01 kg/m.  GENERAL:  Well appearing NECK:  No jugular venous distention, waveform within normal limits, carotid upstroke brisk and symmetric, no bruits, no thyromegaly LUNGS:  Clear to auscultation bilaterally CHEST:  Unremarkable HEART:  PMI not displaced or sustained,S1 and S2 within normal limits, no S3,  no clicks, no rubs, 2 out of 6  apical systolic murmur radiating out the aortic outflow tract, no diastolic murmurs, irregular ABD:  Flat, positive bowel sounds normal in frequency in pitch, no bruits, no rebound, no guarding, no midline pulsatile mass, no hepatomegaly, no splenomegaly EXT:  2 plus pulses throughout, no edema, no cyanosis no clubbing   EKG:  EKG is  ordered today. Atrial fibrillation, rate 131, right bundle branch block, no acute ST-T wave changes, no change from previous.  Recent Labs: 03/25/2021: ALT 34 04/16/2021: BUN 15; Creatinine, Ser 1.08; Hemoglobin 17.1; Platelets 102; Potassium 4.0; Sodium 135    Lipid Panel    Component Value Date/Time   CHOL 108 12/29/2020 0807   CHOL 107 01/07/2013 0840   TRIG 54 12/29/2020 0807   TRIG 59 03/14/2017 0802   TRIG 47 01/07/2013 0840   HDL 55 12/29/2020 0807   HDL 49 03/14/2017 0802   HDL 53 01/07/2013 0840   CHOLHDL 2.0 12/29/2020 0807   LDLCALC 40 12/29/2020 0807   LDLCALC 44 01/03/2014 0803   LDLCALC 45 01/07/2013 0840     Lab Results  Component Value Date   HGBA1C 5.0 04/13/2021    Wt Readings from Last 3 Encounters:  04/21/21 237 lb (107.5 kg)  04/16/21 234 lb (106.1 kg)  03/25/21 230 lb (104.3 kg)      Other studies Reviewed: Additional studies/ records that were reviewed today include:  ED records.   Review of the above records demonstrates:   See elsewhere   ASSESSMENT AND PLAN:    Chest pain: His chest pain sounded nonanginal.  Enzymes  were negative.  He is going to see a gastroenterologist and I think this is reasonable.  I would like to see him immediately after that evaluation and might consider ischemia work-up although I think this is very unlikely.  Aortic stenosis:    This was mild on echocardiogram in 2021.  No change in therapy.    Atrial fib: He has atrial for with rapid rate.  However, not can start anticoagulation until he has seen GI.  I am going to have him start Cardizem 30 mg twice daily since he did not tolerate  the higher dose.  He is going to get a 3-day Zio patch.  Hyperlipidemia:   LDL was 40.  No change in therapy.   OSA: He uses CPAP.No change in therapy  Carotid stenosis:   This was mild two years ago.  No change in therapy.   Current medicines are reviewed at length with the patient today.  The patient does not have concerns regarding medicines.  The following changes have been made: None  Labs/ tests ordered today include:    Orders Placed This Encounter  Procedures   LONG TERM MONITOR (3-14 DAYS)   EKG 12-Lead      Disposition:   FU with me in 1 months.      Signed, Minus Breeding, MD  04/21/2021 2:52 PM    [facility]

## 2021-04-21 ENCOUNTER — Encounter: Payer: Self-pay | Admitting: Cardiology

## 2021-04-21 ENCOUNTER — Ambulatory Visit (INDEPENDENT_AMBULATORY_CARE_PROVIDER_SITE_OTHER): Payer: BC Managed Care – PPO

## 2021-04-21 ENCOUNTER — Ambulatory Visit (INDEPENDENT_AMBULATORY_CARE_PROVIDER_SITE_OTHER): Payer: BC Managed Care – PPO | Admitting: Cardiology

## 2021-04-21 ENCOUNTER — Other Ambulatory Visit: Payer: Self-pay

## 2021-04-21 VITALS — BP 124/72 | HR 131 | Ht 70.0 in | Wt 237.0 lb

## 2021-04-21 DIAGNOSIS — I350 Nonrheumatic aortic (valve) stenosis: Secondary | ICD-10-CM

## 2021-04-21 DIAGNOSIS — I480 Paroxysmal atrial fibrillation: Secondary | ICD-10-CM

## 2021-04-21 DIAGNOSIS — R072 Precordial pain: Secondary | ICD-10-CM | POA: Diagnosis not present

## 2021-04-21 DIAGNOSIS — E785 Hyperlipidemia, unspecified: Secondary | ICD-10-CM

## 2021-04-21 MED ORDER — DILTIAZEM HCL 30 MG PO TABS
30.0000 mg | ORAL_TABLET | Freq: Two times a day (BID) | ORAL | 3 refills | Status: DC
Start: 2021-04-21 — End: 2021-04-23

## 2021-04-21 MED ORDER — DILTIAZEM HCL 30 MG PO TABS: 30.0000 mg | ORAL_TABLET | Freq: Two times a day (BID) | ORAL | 3 refills | Status: DC

## 2021-04-21 NOTE — Progress Notes (Unsigned)
Patient enrolled for [employer] to mail a 3 day ZIO XT to his home.

## 2021-04-21 NOTE — Patient Instructions (Signed)
Medication Instructions:  Stop taking Cardizem 180 mg daily Start taking Cardizem 30 mg twice a day  *If you need a refill on your cardiac medications before your next appointment, please call your pharmacy*   Lab Work: None ordered today   Testing/Procedures: Our physician has recommended that you wear an  3 DAY ZIO-PATCH monitor. The Zio patch cardiac monitor continuously records heart rhythm data for up to 14 days, this is for patients being evaluated for multiple types heart rhythms. For the first 24 hours post application, please avoid getting the Zio monitor wet in the shower or by excessive sweating during exercise. After that, feel free to carry on with regular activities. Keep soaps and lotions away from the ZIO XT Patch.   Someone from our office will call to verify address and mail monitor.     Follow-Up: At [facility], you and your health needs are our priority.  As part of our continuing mission to provide you with exceptional heart care, we have created designated Provider Care Teams.  These Care Teams include your primary Cardiologist (physician) and Advanced Practice Providers (APPs -  Physician Assistants and Nurse Practitioners) who all work together to provide you with the care you need, when you need it.  We recommend signing up for the patient portal called "MyChart".  Sign up information is provided on this After Visit Summary.  MyChart is used to connect with patients for Virtual Visits (Telemedicine).  Patients are able to view lab/test results, encounter notes, upcoming appointments, etc.  Non-urgent messages can be sent to your provider as well.   To learn more about what you can do with MyChart, go to [email or web address].    Your next appointment:   1 month(s)  The format for your next appointment:   In Person  Provider:   Minus Breeding, MD   [place] Monitor Instructions  Your physician has requested you wear a ZIO patch monitor for 3  days.  This is a single patch monitor. [employer] supplies one patch monitor per enrollment. Additional stickers are not available. Please do not apply patch if you will be having a Nuclear Stress Test,  Echocardiogram, Cardiac CT, MRI, or Chest Xray during the period you would be wearing the  monitor. The patch cannot be worn during these tests. You cannot remove and re-apply the  ZIO XT patch monitor.  Your ZIO patch monitor will be mailed 3 day USPS to your address on file. It may take 3-5 days  to receive your monitor after you have been enrolled.  Once you have received your monitor, please review the enclosed instructions. Your monitor  has already been registered assigning a specific monitor serial # to you.  Billing and Patient Assistance Program Information  We have supplied [employer] with any of your insurance information on file for billing purposes. [employer] offers a sliding scale Patient Assistance Program for patients that do not have  insurance, or whose insurance does not completely cover the cost of the ZIO monitor.  You must apply for the Patient Assistance Program to qualify for this discounted rate.  To apply, please call [employer] at [phone], select option 4, select option 2, ask to apply for  Patient Assistance Program. Theodore Demark will ask your household income, and how many people  are in your household. They will quote your out-of-pocket cost based on that information.  [employer] will also be able to set up a 51-month interest-free payment plan if needed.  Applying the  monitor   Shave hair from upper left chest.  Hold abrader disc by orange tab. Rub abrader in 40 strokes over the upper left chest as  indicated in your monitor instructions.  Clean area with 4 enclosed alcohol pads. Let dry.  Apply patch as indicated in monitor instructions. Patch will be placed under collarbone on left  side of chest with arrow pointing upward.  Rub patch adhesive wings for 2 minutes.  Remove white label marked "1". Remove the white  label marked "2". Rub patch adhesive wings for 2 additional minutes.  While looking in a mirror, press and release button in center of patch. A small green light will  flash 3-4 times. This will be your only indicator that the monitor has been turned on.  Do not shower for the first 24 hours. You may shower after the first 24 hours.  Press the button if you feel a symptom. You will hear a small click. Record Date, Time and  Symptom in the Patient Logbook.  When you are ready to remove the patch, follow instructions on the last 2 pages of Patient  Logbook. Stick patch monitor onto the last page of Patient Logbook.  Place Patient Logbook in the blue and white box. Use locking tab on box and tape box closed  securely. The blue and white box has prepaid postage on it. Please place it in the mailbox as  soon as possible. Your physician should have your test results approximately 7 days after the  monitor has been mailed back to [facility].  Call [place] at [phone] if you have questions regarding  your ZIO XT patch monitor. Call them immediately if you see an orange light blinking on your  monitor.  If your monitor falls off in less than 4 days, contact our Monitor department at [phone].  If your monitor becomes loose or falls off after 4 days call [employer] at [phone] for  suggestions on securing your monitor

## 2021-04-22 ENCOUNTER — Other Ambulatory Visit (INDEPENDENT_AMBULATORY_CARE_PROVIDER_SITE_OTHER): Payer: BC Managed Care – PPO

## 2021-04-22 ENCOUNTER — Encounter: Payer: Self-pay | Admitting: Physician Assistant

## 2021-04-22 ENCOUNTER — Ambulatory Visit (INDEPENDENT_AMBULATORY_CARE_PROVIDER_SITE_OTHER): Payer: BC Managed Care – PPO | Admitting: Physician Assistant

## 2021-04-22 VITALS — BP 110/68 | HR 51 | Ht 70.0 in | Wt 231.0 lb

## 2021-04-22 DIAGNOSIS — K30 Functional dyspepsia: Secondary | ICD-10-CM

## 2021-04-22 DIAGNOSIS — R1010 Upper abdominal pain, unspecified: Secondary | ICD-10-CM | POA: Diagnosis not present

## 2021-04-22 DIAGNOSIS — E109 Type 1 diabetes mellitus without complications: Secondary | ICD-10-CM | POA: Diagnosis not present

## 2021-04-22 DIAGNOSIS — R197 Diarrhea, unspecified: Secondary | ICD-10-CM

## 2021-04-22 DIAGNOSIS — R509 Fever, unspecified: Secondary | ICD-10-CM | POA: Diagnosis not present

## 2021-04-22 LAB — COMPREHENSIVE METABOLIC PANEL
ALT: 44 U/L (ref 0–53)
AST: 37 U/L (ref 0–37)
Albumin: 3.5 g/dL (ref 3.5–5.2)
Alkaline Phosphatase: 99 U/L (ref 39–117)
BUN: 15 mg/dL (ref 6–23)
CO2: 31 mEq/L (ref 19–32)
Calcium: 10.1 mg/dL (ref 8.4–10.5)
Chloride: 105 mEq/L (ref 96–112)
Creatinine, Ser: 0.97 mg/dL (ref 0.40–1.50)
GFR: 78.52 mL/min (ref >60.00)
Glucose, Bld: 52 mg/dL — ABNORMAL LOW (ref 70–99)
Potassium: 4.2 mEq/L (ref 3.5–5.1)
Sodium: 142 mEq/L (ref 135–145)
Total Bilirubin: 1.2 mg/dL (ref 0.2–1.2)
Total Protein: 7.4 g/dL (ref 6.0–8.3)

## 2021-04-22 LAB — CBC WITH DIFFERENTIAL/PLATELET
Basophils Absolute: 0.1 10*3/uL (ref 0.0–0.1)
Basophils Relative: 1 % (ref 0.0–3.0)
Eosinophils Absolute: 0.2 10*3/uL (ref 0.0–0.7)
Eosinophils Relative: 2.6 % (ref 0.0–5.0)
HCT: 50.1 % (ref 39.0–52.0)
Hemoglobin: 16.9 g/dL (ref 13.0–17.0)
Lymphocytes Relative: 19.6 % (ref 12.0–46.0)
Lymphs Abs: 1.4 10*3/uL (ref 0.7–4.0)
MCHC: 33.7 g/dL (ref 30.0–36.0)
MCV: 104 fl — ABNORMAL HIGH (ref 78.0–100.0)
Monocytes Absolute: 1.1 10*3/uL — ABNORMAL HIGH (ref 0.1–1.0)
Monocytes Relative: 16.4 % — ABNORMAL HIGH (ref 3.0–12.0)
Neutro Abs: 4.2 10*3/uL (ref 1.4–7.7)
Neutrophils Relative %: 60.4 % (ref 43.0–77.0)
Platelets: 118 10*3/uL — ABNORMAL LOW (ref 150.0–400.0)
RBC: 4.82 Mil/uL (ref 4.22–5.81)
RDW: 15 % (ref 11.5–15.5)
WBC: 7 10*3/uL (ref 4.0–10.5)

## 2021-04-22 LAB — URINALYSIS
Bilirubin Urine: NEGATIVE
Hgb urine dipstick: NEGATIVE
Ketones, ur: NEGATIVE
Leukocytes,Ua: NEGATIVE
Nitrite: NEGATIVE
Specific Gravity, Urine: 1.01 (ref 1.000–1.030)
Total Protein, Urine: NEGATIVE
Urine Glucose: NEGATIVE
Urobilinogen, UA: 0.2 (ref 0.0–1.0)
pH: 7 (ref 5.0–8.0)

## 2021-04-22 LAB — SEDIMENTATION RATE: Sed Rate: 29 mm/hr — ABNORMAL HIGH (ref 0–20)

## 2021-04-22 LAB — LIPASE: Lipase: 10 U/L — ABNORMAL LOW (ref 11.0–59.0)

## 2021-04-22 LAB — C-REACTIVE PROTEIN: CRP: 2.9 mg/dL (ref 0.5–20.0)

## 2021-04-22 MED ORDER — PANTOPRAZOLE SODIUM 40 MG PO TBEC: 40.0000 mg | DELAYED_RELEASE_TABLET | Freq: Two times a day (BID) | ORAL | 0 refills | Status: DC

## 2021-04-22 NOTE — Progress Notes (Signed)
Subjective:    Patient ID: Tony Cantu, male    DOB: [birth year], 70 y.o.   MRN: [id number]  HPI Tony Cantu is a pleasant 70 year old white male, established with Dr. Hilarie Fredrickson, who was last seen here in September 2021.  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 Also with history of aortic valve stenosis, atrial fibrillation, hypertension, adult onset diabetes mellitus, GERD, and BPH. He had MRI of the abdomen done in August 2022 which showed stable cirrhosis, mild pancreatic atrophy stable small splenic lesion and no evidence of ascites.  He has stable perigastric and perisplenic varices. He does have history of a gastric ulcer on EGD in August 2020, no esophageal or gastric varices noted at that time or portal gastropathy.  Biopsy showed chronic gastritis no H. pylori And was indicated for follow-up in 2 years. Last colonoscopy August 2020 with 4 polyps removed largest 6 mm these were tubular adenomas also with multiple diverticuli and again indicated for 3-year interval follow-up. Current symptoms started about 3 weeks ago with what he describes as pain in the area of his sternum which was an aching sensation without any radiation.  He says that it is worst was about 7 out of 10.  The following day this gradually moved more to his left side laterally and into his left shoulder and was persisting until yesterday when it resolved. Patient had been seen in the ER on 04/16/2021, underwent cardiac work-up per ER MD and discussion with cardiology.  He did have some tachycardia/A. fib which was treated with metoprolol he was ultimately discharged and then followed up with Dr. Percival Spanish yesterday.  Patient at that time had said that he was "living on GI cocktail" though he did admit to having persistent heart racing type symptoms.  He Was Seen in Cardiology Yesterday Heart Rate Was 131.  This Is Secondary to Atrial Fibrillation. He Is to Have Zio Patch Monitoring.   Cardiology Was Aware That He Had Appointment with GI Today and Wanted Him to Be Seen Here and Then Decision Would Be Made regarding Further Ischemic Work-Up.  In addition to the chest symptoms which have now resolved he has been having ongoing problems over the past couple of weeks with complaints of indigestion, belching and burping.  His wife who is a nurse has been giving him Maalox mixed with Mylanta on a regular basis which does help his symptoms.  He has also been having some urgency and diarrhea postprandially and having anywhere from 3-6 loose bowel movements per day.  He denies any abdominal cramping, and no bleeding. He also mentions that he did have some fever over the past week with temp above 101 this past Friday, and has had some intermittent low-grade fever since.  They did a home COVID test which was negative. Again today patient denies any chest pain says he is never had any shortness of breath with the symptoms.  Appetite has been fair, no nausea or vomiting. Imodium at home has not been helpful, Pepto-Bismol has been helping. He has been on Protonix 40 mg daily long-term. He had not been on any recent new medications, or any antibiotics.  Review of Systems Pertinent positive and negative review of systems were noted in the above HPI section.  All other review of systems was otherwise negative.   Outpatient Encounter Medications as of 04/22/2021  Medication Sig   albuterol (VENTOLIN HFA) 108 (90 Base) MCG/ACT inhaler Inhale 2 puffs into the lungs every 6 (  six) hours as needed for wheezing or shortness of breath.   aspirin 81 MG chewable tablet Chew by mouth daily.   atorvastatin (LIPITOR) 40 MG tablet TAKE 1 TABLET BY MOUTH  DAILY   b complex vitamins tablet Take 1 tablet by mouth daily at 12 noon.   Blood Glucose Monitoring Suppl (CONTOUR NEXT ONE) KIT Test BS QID and as needed Dx E11.9   Calcium Carbonate-Vitamin D 600-400 MG-UNIT tablet Take 1 tablet by mouth daily.    Cholecalciferol (VITAMIN D3) 2000 UNITS capsule Take 2,000 Units by mouth daily at 12 noon.   Continuous Blood Gluc Receiver (FREESTYLE LIBRE READER) DEVI 1 applicator by Does not apply route as directed.   Continuous Blood Gluc Sensor (FREESTYLE LIBRE SENSOR SYSTEM) MISC Check BS eight (8) times a day. Dx E10.9   diltiazem (CARDIZEM) 30 MG tablet Take 1 tablet (30 mg total) by mouth 2 (two) times daily.   furosemide (LASIX) 20 MG tablet TAKE 1 AND 1/2 TABLETS BY  MOUTH DAILY   Glucagon, rDNA, (GLUCAGON EMERGENCY) 1 MG KIT Inject 1 mg into the skin Once PRN for up to 1 dose.   Glucosamine-Chondroit-Vit C-Mn (GLUCOSAMINE 1500 COMPLEX) CAPS Take 2 capsules by mouth daily at 12 noon.   glucose blood (ONETOUCH ULTRA) test strip Use as needed E11.9   insulin lispro (HUMALOG) 100 UNIT/ML injection INJECT SUBCUTANEOUSLY 20 TO 50 UNITS 3 TIMES DAILY  BEFORE MEALS PER SLIDING  SCALE (Patient taking differently: 6-30 Units. INJECT SUBCUTANEOUSLY 6-30 UNITS 7-8 TIMES DAILY  BEFORE MEALS PER SLIDING  SCALE)   Microlet Lancets MISC Test BS QID and as needed Dx E11.9   montelukast (SINGULAIR) 10 MG tablet TAKE 1 TABLET BY MOUTH AT  BEDTIME   multivitamin-lutein (OCUVITE-LUTEIN) CAPS capsule Take 1 capsule by mouth daily at 12 noon.   Testosterone 30 MG/ACT SOLN APPLY ONE PUMP TOPICALLY  UNDER EACH AXILLA DAILY  (TOTAL OF 60MG DAILY )   TOUJEO MAX SOLOSTAR 300 UNIT/ML Solostar Pen INJECT 110 UNITS INTO THE  SKIN DAILY (Patient taking differently: Inject 80 Units into the skin daily. 8 pm)   valsartan (DIOVAN) 160 MG tablet TAKE 1 TABLET BY MOUTH  DAILY (Patient taking differently: Take 80 mg by mouth daily.)   [DISCONTINUED] pantoprazole (PROTONIX) 40 MG tablet TAKE 1 TABLET BY MOUTH  DAILY   pantoprazole (PROTONIX) 40 MG tablet Take 1 tablet (40 mg total) by mouth 2 (two) times daily before a meal.   sildenafil (REVATIO) 20 MG tablet TAKE 2-5 TABLETS AS NEEDED PRIOR TO SEXUAL ACTIVITY   [DISCONTINUED]  benzonatate (TESSALON) 200 MG capsule Take 1 capsule (200 mg total) by mouth 3 (three) times daily as needed for cough. (Patient not taking: No sig reported)   [DISCONTINUED] fexofenadine (ALLEGRA) 180 MG tablet Take 1 tablet (180 mg total) by mouth daily with breakfast. (Patient not taking: No sig reported)   No facility-administered encounter medications on file as of 04/22/2021.   Allergies  Allergen Reactions   Erythromycin Diarrhea   Sulfonamide Derivatives Diarrhea   Patient Active Problem List   Diagnosis Date Noted   Precordial chest pain 04/20/2021   Former smoker 01/12/2021   PAF (paroxysmal atrial fibrillation) ([place]) 06/22/2020   Hypertension associated with type 2 diabetes mellitus ([place]) 05/17/2019   Hyperlipidemia associated with type 2 diabetes mellitus ([place]) 05/17/2019   GERD with esophagitis 05/17/2019   H/O total shoulder replacement, right 11/01/2018   Paroxysmal tachycardia ([place]) 01/11/2017   Nonrheumatic aortic valve stenosis 01/11/2017  Aortic atherosclerosis ([place]) 11/23/2016   HNP (herniated nucleus pulposus), lumbar 10/06/2016   Acute renal injury ([place]) 08/06/2016   Diarrhea 08/06/2016   Fever 08/06/2016   Hyperbilirubinemia 08/06/2016   Lactic acidosis 08/06/2016   Inguinal hernia 12/10/2015   Right groin pain 11/30/2015   Carpal tunnel syndrome on right 09/09/2015   Cervical spondylosis without myelopathy 09/09/2015   Thrombocytopenia ([place]) 07/21/2014   Bilateral carotid bruits 05/11/2014   Vitamin D deficiency 09/17/2013   BPH (benign prostatic hyperplasia) 05/22/2013   Low serum testosterone level 02/18/2013   Diabetes type 2, controlled ([place]) 01/10/2013   OSA (obstructive sleep apnea) 05/16/2012   Edema 02/21/2012   At risk for coronary artery disease 03/20/2011   Obesity 03/20/2011   Allergic rhinitis 06/22/2010   ASTHMA 06/22/2010   COUGH 06/22/2010   Social History   Socioeconomic History   Marital status: Married    Spouse name: Not on  file   Number of children: Not on file   Years of education: Not on file   Highest education level: Not on file  Occupational History   Occupation: retired   Tobacco Use   Smoking status: Former    Years: 34.00    Types: Cigarettes, Pipe    Quit date: 2005    Years since quitting: 17.6   Smokeless tobacco: Never   Tobacco comments:    quit 2005 smoked cigarettes for 5 yrs prior to pipe use  Vaping Use   Vaping Use: Never used  Substance and Sexual Activity   Alcohol use: No    Comment: once a year   Drug use: No   Sexual activity: Yes  Other Topics Concern   Not on file  Social History Narrative   Not on file   Social Determinants of Health   Financial Resource Strain: Not on file  Food Insecurity: Not on file  Transportation Needs: Not on file  Physical Activity: Not on file  Stress: Not on file  Social Connections: Not on file  Intimate Partner Violence: Not on file    Mr. Monforte family history includes Allergic rhinitis in his sister; Alzheimer's disease in his mother; Asthma in his father; Breast cancer in his maternal grandmother; Diabetes in his mother; Heart attack in his maternal grandmother and mother; Heart failure in his maternal grandmother and mother; Rheum arthritis in his maternal grandmother; Suicidality in his father.      Objective:    Vitals:   04/22/21 0915  BP: 110/68  Pulse: (!) 51    Physical Exam Well-developed well-nourished older white male in no acute distress.   Weight, 231 BMI 33.1 accompanied by his wife      temp 98 5  HEENT; nontraumatic normocephalic, EOMI, PE R LA, sclera anicteric. Oropharynx; not examined today Neck; supple, no JVD Cardiovascular; regular rate and rhythm with S1-S2, no murmur rub or gallop Pulmonary; Clear bilaterally Abdomen; soft, minimal tenderness in the epigastrium and left upper quadrant no guarding nondistended, no palpable mass or hepatosplenomegaly, bowel sounds are active Rectal; not done  today Skin; benign exam, no jaundice rash or appreciable lesions Extremities; no clubbing cyanosis or edema skin warm and dry Neuro/Psych; alert and oriented x4, grossly nonfocal mood and affect appropriate        Assessment & Plan:   #37  70 year old white male with history of hypertension and atrial fibrillation with onset of illness about 3 weeks ago.  Patient has had multiple symptoms.  He did have a ER evaluation on 04/16/2021  when he presented with substernal chest discomfort described as aching in nature, no associated shortness of breath but episodes of heart racing and was found to be in atrial fibrillation.  Per the ER notes he did have some ST changes on EKG, troponins were flat,, he was treated with rate control, and after discussions with cardiology was allowed discharge to home. He was evaluated by cardiology yesterday, is to undergo Zio patch monitoring, and considering further ischemic work-up pending GI evaluation today. Currently the aching chest discomfort has resolved  #2 heartburn indigestion, belching, upper abdominal discomfort postprandial diarrhea with 3-6 bowel movements per day, decrease in appetite and intermittent fever over the past week Etiology of his constellation symptoms is not clear.  Home COVID testing was negative. Unclear if current GI symptoms triggered atrial fibrillation. Suspect underlying infectious etiology, rule out intra-abdominal infection/inflammatory process, rule out infectious gastroenteritis.  #3 chronic GERD #4 diabetes mellitus 5.  History of aortic valve stenosis 6.  Sleep apnea 7.  BPH 8.  History of adenomatous colon polyps 9.  Diverticulosis 10.  Compensated cirrhosis, nonalcoholic fatty liver disease most recent imaging August 2022 #11 prior history of gastric ulcer nonbleeding August 2020  Plan; repeat labs today to include CBC with differential, c-Met, sed rate CRP lipase GI path panel ,stool for lactoferrin Increase Protonix  to 40 mg p.o. twice daily AC breakfast and AC dinner As Imodium had not been helpful continue Pepto-Bismol may take dose prior to meals as needed Okay to continue Mylanta as needed  Will schedule for CT of the abdomen and pelvis as soon as possible. If labs and CT are unremarkable he may require endoscopic evaluation. Have advised patient and wife that if he has any worsening of symptoms over the next few days or recurrent fevers to 102 he should present to the emergency room for evaluation. I will communicate with Dr. Percival Spanish as patient would certainly need some sort of ischemic work-up prior to consideration of any endoscopic evaluation/sedation. Patient is due due for follow-up EGD for variceal surveillance .  Shaneisha Burkel S Maebell Lyvers PA-C 04/22/2021   Cc: [name], DO

## 2021-04-22 NOTE — Patient Instructions (Addendum)
If you are age 71 or older, your body mass index should be between 23-30. Your Body mass index is 33.15 kg/m. If this is out of the aforementioned range listed, please consider follow up with your Primary Care Provider. __________________________________________________________  The [facility] providers would like to encourage you to use [facility] to communicate with providers for non-urgent requests or questions.  Due to long hold times on the telephone, sending your provider a message by [facility] may be a faster and more efficient way to get a response.  Please allow 48 business hours for a response.  Please remember that this is for non-urgent requests.   You have been scheduled for a CT scan of the abdomen and pelvis at [facility], 1st floor Radiology. You are scheduled on 04/28/2021  at 4:30 pm. You should arrive 15 minutes prior to your appointment time for registration.  Please pick up 2 bottles of contrast from [place] at least 3 days prior to your scan. The solution may taste better if refrigerated, but do NOT add ice or any other liquid to this solution. Shake well before drinking.   Please follow the written instructions below on the day of your exam:   1) Do not eat anything after 12:30 pm (4 hours prior to your test)   2) Drink 1 bottle of contrast @ 2:30 pm (2 hours prior to your exam)  Remember to shake well before drinking and do NOT pour over ice.     Drink 1 bottle of contrast @ 3:30 pm (1 hour prior to your exam)   You may take any medications as prescribed with a small amount of water, if necessary. If you take any of the following medications: METFORMIN, GLUCOPHAGE, GLUCOVANCE, AVANDAMET, RIOMET, FORTAMET, [place] MET, JANUMET, GLUMETZA or METAGLIP, you MAY be asked to HOLD this medication 48 hours AFTER the exam.   The purpose of you drinking the oral contrast is to aid in the visualization of your intestinal tract. The contrast solution may cause some diarrhea. Depending on  your individual set of symptoms, you may also receive an intravenous injection of x-ray contrast/dye. Plan on being at [facility] for 45 minutes or longer, depending on the type of exam you are having performed.   If you have any questions regarding your exam or if you need to reschedule, you may call [facility] at [phone] between the hours of 8:00 am and 5:00 pm, Monday-Friday.   Your provider has requested that you go to the basement level for lab work before leaving today. Press "B" on the elevator. The lab is located at the first door on the left as you exit the elevator.  Increase Pantoprazole to 40 mg 1 tablet twice daily before meals  Use Pepto Bismol as needed for diarrhea  Continue Bland Diet  Go to the ER for worsening symptoms.  Thank you for entrusting me with your care and choosing [facility].  Amy Esterwood, PA-C

## 2021-04-22 NOTE — Progress Notes (Signed)
Addendum: Reviewed and agree with assessment and management plan. Lianny Molter M, MD  

## 2021-04-23 ENCOUNTER — Other Ambulatory Visit: Payer: Self-pay | Admitting: *Deleted

## 2021-04-23 DIAGNOSIS — I480 Paroxysmal atrial fibrillation: Secondary | ICD-10-CM | POA: Diagnosis not present

## 2021-04-23 LAB — FECAL LACTOFERRIN, QUANT
Fecal Lactoferrin: NEGATIVE
MICRO NUMBER:: 12291259
SPECIMEN QUALITY:: ADEQUATE

## 2021-04-23 MED ORDER — DILTIAZEM HCL 30 MG PO TABS: 30.0000 mg | ORAL_TABLET | Freq: Two times a day (BID) | ORAL | 3 refills | Status: DC

## 2021-04-24 LAB — CLOSTRIDIUM DIFFICILE BY PCR: Toxigenic C. Difficile by PCR: NEGATIVE

## 2021-04-25 LAB — GI PROFILE, STOOL, PCR
Adenovirus F 40/41: NOT DETECTED
Astrovirus: NOT DETECTED
C difficile toxin A/B: DETECTED — AB
Campylobacter: NOT DETECTED
Cryptosporidium: NOT DETECTED
Cyclospora cayetanensis: NOT DETECTED
E coli O157: NOT DETECTED
Entamoeba histolytica: NOT DETECTED
Enteroaggregative E coli: NOT DETECTED
Enterotoxigenic E coli: NOT DETECTED
Giardia lamblia: NOT DETECTED
Norovirus GI/GII: NOT DETECTED
Plesiomonas shigelloides: NOT DETECTED
Rotavirus A: NOT DETECTED
Salmonella: NOT DETECTED
Sapovirus: NOT DETECTED
Shiga-toxin-producing E coli: DETECTED — AB
Shigella/Enteroinvasive E coli: NOT DETECTED
Vibrio cholerae: NOT DETECTED
Vibrio: NOT DETECTED
Yersinia enterocolitica: NOT DETECTED

## 2021-04-26 ENCOUNTER — Encounter: Payer: Self-pay | Admitting: Family Medicine

## 2021-04-27 ENCOUNTER — Other Ambulatory Visit: Payer: Self-pay

## 2021-04-27 ENCOUNTER — Encounter: Payer: Self-pay | Admitting: Physician Assistant

## 2021-04-27 ENCOUNTER — Telehealth: Payer: Self-pay

## 2021-04-27 ENCOUNTER — Ambulatory Visit: Payer: Medicare Other | Admitting: Family Medicine

## 2021-04-27 ENCOUNTER — Other Ambulatory Visit: Payer: Self-pay | Admitting: *Deleted

## 2021-04-27 MED ORDER — VANCOMYCIN HCL 125 MG PO CAPS: 125.0000 mg | ORAL_CAPSULE | Freq: Four times a day (QID) | ORAL | 0 refills | Status: AC

## 2021-04-27 MED ORDER — DILTIAZEM HCL 30 MG PO TABS
30.0000 mg | ORAL_TABLET | Freq: Two times a day (BID) | ORAL | 3 refills | Status: DC
Start: 2021-04-27 — End: 2022-03-02

## 2021-04-27 NOTE — Telephone Encounter (Addendum)
Patient is advised of his results. States he had 3 days of no diarrhea. However, it returned yesterday and he still has it today. Fever has not been above 99.0 Agrees to this plan.   He asks if he is going to need an EGD. He thinks he has to have one for the INSPIRE breather he is to be getting from Dr Redmond Baseman.   You do not have any openings on the schedule for at least 4 weeks. Then it is only the "triage" appointments that are held.

## 2021-04-27 NOTE — Telephone Encounter (Signed)
-----   Message from Alfredia Ferguson, PA-C sent at 04/26/2021  1:22 PM EDT ----- Please call pt - let them know stool culture positive for cdiff !.. also Positive for ecoli shiga toxin which is usually self limited and not generally treated - please start Vancomycin 125 mg po QID x 2 weeks  Will need follow up office visit in 2-3 weeks -  Find out how he did this weekend ?fever ? How much diarrhea  If he does not improve quickly on vancomycin they need to call and ask for you - give update

## 2021-04-28 ENCOUNTER — Ambulatory Visit (HOSPITAL_COMMUNITY)
Admission: RE | Admit: 2021-04-28 | Discharge: 2021-04-28 | Disposition: A | Discharge status: Home / Self Care | Payer: BC Managed Care – PPO | Source: Ambulatory Visit | Attending: Physician Assistant | Admitting: Physician Assistant

## 2021-04-28 ENCOUNTER — Other Ambulatory Visit: Payer: Self-pay

## 2021-04-28 DIAGNOSIS — R197 Diarrhea, unspecified: Secondary | ICD-10-CM | POA: Diagnosis not present

## 2021-04-28 DIAGNOSIS — K7469 Other cirrhosis of liver: Secondary | ICD-10-CM | POA: Diagnosis not present

## 2021-04-28 DIAGNOSIS — K5730 Diverticulosis of large intestine without perforation or abscess without bleeding: Secondary | ICD-10-CM | POA: Insufficient documentation

## 2021-04-28 DIAGNOSIS — I700 Atherosclerosis of aorta: Secondary | ICD-10-CM | POA: Insufficient documentation

## 2021-04-28 DIAGNOSIS — N200 Calculus of kidney: Secondary | ICD-10-CM | POA: Diagnosis not present

## 2021-04-28 MED ORDER — IOHEXOL 350 MG/ML SOLN
80.0000 mL | Freq: Once | INTRAVENOUS | Status: AC | PRN
  Administered 2021-04-28: 80 mL via INTRAVENOUS

## 2021-04-29 DIAGNOSIS — I480 Paroxysmal atrial fibrillation: Secondary | ICD-10-CM | POA: Diagnosis not present

## 2021-05-05 ENCOUNTER — Telehealth: Payer: Self-pay

## 2021-05-05 NOTE — Telephone Encounter (Signed)
Advised of the imaging results. He reports that the diarrhea is gone. He continues to have fevers. Temp 99 to 100.4-Tylenol will bring it to 98.7. States his normal temp when well is 97 oral.  No abdominal discomfort. He says he never really did have any stomach issue. Reports he is "about half way through the Vanco."

## 2021-05-05 NOTE — Telephone Encounter (Signed)
-----   Message from Alfredia Ferguson, PA-C sent at 05/04/2021  1:02 PM EDT ----- Please call pt and let them know the CT scan looks good - no bowel Antonelli thickening  or signs of colitis . He does have cirrhosis changes noted.  He was cdiff positive and should be completing a course of vancomycin - the Cdiff is what was causing diarrhea and abdominal discomfort - he should be feeling better - let me know how he is doing  - is diarrhea persisting?, abd pain? Fever? Able to eat ?

## 2021-05-05 NOTE — Telephone Encounter (Signed)
October 17 is the first available. Is this okay?

## 2021-05-05 NOTE — Telephone Encounter (Signed)
No, unfortunately no one does.

## 2021-05-07 ENCOUNTER — Other Ambulatory Visit: Payer: Self-pay

## 2021-05-07 DIAGNOSIS — R509 Fever, unspecified: Secondary | ICD-10-CM

## 2021-05-07 DIAGNOSIS — R197 Diarrhea, unspecified: Secondary | ICD-10-CM

## 2021-05-07 NOTE — Telephone Encounter (Signed)
Advised of the plan.

## 2021-05-07 NOTE — Telephone Encounter (Signed)
Patient calling to follow up requesting a call back.

## 2021-05-12 NOTE — Progress Notes (Signed)
Cardiology Office Note:    Date:  05/19/2021   ID:  [name], DOB [birth year], MRN [id number]  PCP:  [name], DO   [facility] Providers Cardiologist:  Minus Breeding, MD     Referring MD: [name], DO   Follow-up for chest discomfort and paroxysmal atrial fibrillation  History of Present Illness:    Tony Cantu is a 70 y.o. male with a hx of aortic atherosclerosis, paroxysmal tachycardia, hypertension, paroxysmal atrial fibrillation, OSA, GERD, type 2 diabetes, HLD, BPH, and asthma.  He had a normal stress test in 2009.  He uses CPAP for his OSA.  He was also noted to have mild carotid artery disease.  He was noted to have atrial fibrillation with RVR and was initiated on apixaban.  His atrial fibrillation appeared to be a isolated incident and his apixaban was stopped.  He was seen in the emergency department with chest discomfort.  His EKG at that time showed early repolarization.  His cardiac troponins were minimally elevated and flat.  He was treated with a GI cocktail.  This produced good results and he was pain-free.  He followed up with Dr. Percival Spanish.  During that time he reported symptoms of substernal chest discomfort that he had during his previous ER visit.  He reported that after the episode he had periods of diarrhea.  He denied nausea and vomiting.  He denied any symptoms of shortness of breath, orthopnea, and PND.  He denied syncope.  He did notice some heart racing.  He reported that he also been given Cardizem in the emergency department.  He reported that this was due to his elevated heart rate.  His chart was reviewed and there was no mention of atrial fibrillation.  He was given 2 doses of 180 mg and became bradycardic in the 30s.  He reported feeling very weak and stopped taking the medication after 2 days.  He presents to the clinic 04/21/2021 for follow-up evaluation and his heart rate had increased to 131.  His EKG showed atrial  fibrillation.  He reported that he did not feel the elevated heart rate.  He denied presyncope and syncope.  He denied chest discomfort.  He did note some soreness in his shoulder.  He reported he had an appointment with GI on 04/22/2021.  He was started on Cardizem 30 mg twice daily and given a 3-day cardiac event monitor.  His cardiac event monitor showed no atrial fibrillation and no significant arrhythmias.  He presents to clinic today for follow-up evaluation states he has had no further episodes of irregular or accelerated heartbeat.  He has not had any further episodes of low heart rate while being on Cardizem.  We reviewed his cardiac event monitor.  He will follow-up with GI on 05/27/2021.  We reviewed his CHA2DS2-VASc score.  We will plan to start apixaban after his anticipated endoscopy procedure.  We reviewed triggers for atrial fibrillation.  He reports he is under an increased amount of stress related to his spouse.  He has participated in counseling before which helped for some time.  However, now he reports that their relationship is a major contributor to his daily stress.  I will continue his current medication regimen.  And have him follow-up in 3 to 4 months.  Today he denies chest pain, shortness of breath, lower extremity edema, fatigue, palpitations, melena, hematuria, hemoptysis, diaphoresis, weakness, presyncope, syncope, orthopnea, and PND.     Past Medical History:  Diagnosis Date   Arthritis    Asthma    BPH (benign prostatic hypertrophy)    Cirrhosis ([place])    Colon polyps    Diabetes mellitus without complication ([place])    TYPE 1    Diverticulosis    Gastric ulcer    Gastric varices    right   Gastritis    GERD (gastroesophageal reflux disease)    Headache    HX   Hypertension    PONV (postoperative nausea and vomiting)    Renal cyst, right    Seizures ([facility])    HYPOGLYCEMIC LAST 1 AND 1/2 YRS AGO   Sleep apnea    uses CPAP nightly   Testicle trouble    one  testicle BORN WITH   Tubular adenoma of colon     Past Surgical History:  Procedure Laterality Date   BACK SURGERY     5  LOWER    CARPAL TUNNEL RELEASE Right 10/13/2015   Procedure: RIGHT CARPAL TUNNEL RELEASE;  Surgeon: Daryll Brod, MD;  Location: [place];  Service: Orthopedics;  Laterality: Right;   CARPAL TUNNEL RELEASE Left 07/05/2016   Procedure: LEFT CARPAL TUNNEL RELEASE;  Surgeon: Daryll Brod, MD;  Location: [place];  Service: Orthopedics;  Laterality: Left;   HERNIA REPAIR     INGUINAL HERNIA REPAIR  2003   right    KNEE ARTHROSCOPY Right 03/03/2016   LUMBAR DISC SURGERY  8/96   Dr. Coralyn Mark, discectomy   LUMBAR LAMINECTOMY/DECOMPRESSION MICRODISCECTOMY Right 10/06/2016   Procedure: RIGHT LUMBAR THREE - LUMBAR FOUR  LAMINECTOMY, FORAMINOTOMY AND MICRODISCECTOMY;  Surgeon: Jovita Gamma, MD;  Location: [facility];  Service: Neurosurgery;  Laterality: Right;   SHOULDER SURGERY  11/28/05   left partial   SHOULDER SURGERY  07/14/2006   RIGHT   TONSILLECTOMY  AGE 59 OR 4   TOTAL SHOULDER ARTHROPLASTY Right 11/01/2018   Procedure: RIGHT SHOULDER REVISION TO REVERSE TOTAL SHOULDER;  Surgeon: Tania Ade, MD;  Location: [facility];  Service: Orthopedics;  Laterality: Right;  CHOICE ANESTHESIA WITH INTERSCALENE BLOCK EXPAREL, NEEDS RNFA    Current Medications: Current Meds  Medication Sig   albuterol (VENTOLIN HFA) 108 (90 Base) MCG/ACT inhaler Inhale 2 puffs into the lungs every 6 (six) hours as needed for wheezing or shortness of breath.   apixaban (ELIQUIS) 5 MG TABS tablet Take 1 tablet (5 mg total) by mouth 2 (two) times daily.   aspirin 81 MG chewable tablet Chew by mouth daily.   atorvastatin (LIPITOR) 40 MG tablet TAKE 1 TABLET BY MOUTH  DAILY   b complex vitamins tablet Take 1 tablet by mouth daily at 12 noon.   Blood Glucose Monitoring Suppl (CONTOUR NEXT ONE) KIT Test BS QID and as needed Dx E11.9   Calcium Carbonate-Vitamin D 600-400 MG-UNIT  tablet Take 1 tablet by mouth daily.   Cholecalciferol (VITAMIN D3) 2000 UNITS capsule Take 2,000 Units by mouth daily at 12 noon.   Continuous Blood Gluc Receiver (FREESTYLE LIBRE READER) DEVI 1 applicator by Does not apply route as directed.   Continuous Blood Gluc Sensor (FREESTYLE LIBRE SENSOR SYSTEM) MISC Check BS eight (8) times a day. Dx E10.9   diltiazem (CARDIZEM) 30 MG tablet Take 1 tablet (30 mg total) by mouth 2 (two) times daily.   furosemide (LASIX) 20 MG tablet TAKE 1 AND 1/2 TABLETS BY  MOUTH DAILY   Glucagon, rDNA, (GLUCAGON EMERGENCY) 1 MG KIT Inject 1 mg into the skin Once PRN for up  to 1 dose.   Glucosamine-Chondroit-Vit C-Mn (GLUCOSAMINE 1500 COMPLEX) CAPS Take 2 capsules by mouth daily at 12 noon.   glucose blood (ONETOUCH ULTRA) test strip Use as needed E11.9   insulin lispro (HUMALOG) 100 UNIT/ML injection INJECT SUBCUTANEOUSLY 20 TO 50 UNITS 3 TIMES DAILY  BEFORE MEALS PER SLIDING  SCALE (Patient taking differently: 6-30 Units. INJECT SUBCUTANEOUSLY 6-30 UNITS 7-8 TIMES DAILY  BEFORE MEALS PER SLIDING  SCALE)   Microlet Lancets MISC Test BS QID and as needed Dx E11.9   montelukast (SINGULAIR) 10 MG tablet TAKE 1 TABLET BY MOUTH AT  BEDTIME   multivitamin-lutein (OCUVITE-LUTEIN) CAPS capsule Take 1 capsule by mouth daily at 12 noon.   pantoprazole (PROTONIX) 40 MG tablet Take 1 tablet (40 mg total) by mouth 2 (two) times daily before a meal.   Testosterone 30 MG/ACT SOLN APPLY ONE PUMP TOPICALLY  UNDER EACH AXILLA DAILY  (TOTAL OF 60MG DAILY )   TOUJEO MAX SOLOSTAR 300 UNIT/ML Solostar Pen INJECT 110 UNITS INTO THE  SKIN DAILY (Patient taking differently: Inject 80 Units into the skin daily. 8 pm)   valsartan (DIOVAN) 160 MG tablet Take 80 mg by mouth daily.     Allergies:   Erythromycin and Sulfonamide derivatives   Social History   Socioeconomic History   Marital status: Married    Spouse name: Not on file   Number of children: Not on file   Years of education:  Not on file   Highest education level: Not on file  Occupational History   Occupation: retired   Tobacco Use   Smoking status: Former    Years: 34.00    Types: Cigarettes, Pipe    Quit date: 2005    Years since quitting: 17.7   Smokeless tobacco: Never   Tobacco comments:    quit 2005 smoked cigarettes for 5 yrs prior to pipe use  Vaping Use   Vaping Use: Never used  Substance and Sexual Activity   Alcohol use: No    Comment: once a year   Drug use: No   Sexual activity: Yes  Other Topics Concern   Not on file  Social History Narrative   ** Merged History Encounter **       Social Determinants of Health   Financial Resource Strain: Not on file  Food Insecurity: Not on file  Transportation Needs: Not on file  Physical Activity: Not on file  Stress: Not on file  Social Connections: Not on file     Family History: The patient's family history includes Allergic rhinitis in his sister; Alzheimer's disease in his mother; Asthma in his father; Breast cancer in his maternal grandmother; Diabetes in his mother; Heart attack in his maternal grandmother and mother; Heart failure in his maternal grandmother and mother; Rheum arthritis in his maternal grandmother; Suicidality in his father. There is no history of Colon cancer, Esophageal cancer, Pancreatic cancer, or Liver disease.  ROS:   Please see the history of present illness.     All other systems reviewed and are negative.              Physical Exam:    VS:  BP 112/60   Pulse 70   Ht _0  (1.778 m)   Wt 231 lb 8 oz (105 kg)   SpO2 95%   BMI 33.22 kg/m     Wt Readings from Last 3 Encounters:  05/19/21 231 lb 8 oz (105 kg)  04/22/21 231 lb (104.8 kg)  04/21/21  237 lb (107.5 kg)     GEN:  Well nourished, well developed in no acute distress HEENT: Normal NECK: No JVD; No carotid bruits LYMPHATICS: No lymphadenopathy CARDIAC: RRR, no murmurs, rubs, gallops RESPIRATORY:  Clear to auscultation without rales,  wheezing or rhonchi  ABDOMEN: Soft, non-tender, non-distended MUSCULOSKELETAL:  No edema; No deformity  SKIN: Warm and dry NEUROLOGIC:  Alert and oriented x 3 PSYCHIATRIC:  Normal affect    EKGs/Labs/Other Studies Reviewed:    The following studies were reviewed today:  Cardiac event monitor 04/30/21 Predominant rhythm was normal sinus SVT noted with the longest run being 7 beats Fairly frequent ventricular ectopy without runs. Runs of ventricular bigeminy and trigeminy.  No sustained arrhythmias.    Echocardiogram 07/02/2020 IMPRESSIONS     1. Left ventricular ejection fraction, by estimation, is 65 to 70%. The  left ventricle has normal function. The left ventricle has no regional  Ficek motion abnormalities. Left ventricular diastolic parameters are  consistent with Grade I diastolic  dysfunction (impaired relaxation).   2. Right ventricular systolic function is normal. The right ventricular  size is normal.   3. Left atrial size was mildly dilated.   4. The mitral valve is normal in structure. No evidence of mitral valve  regurgitation. No evidence of mitral stenosis.   5. The aortic valve is tricuspid. There is moderate calcification of the  aortic valve. There is moderate thickening of the aortic valve. Aortic  valve regurgitation is not visualized. Moderate aortic valve stenosis.   6. The inferior vena cava is normal in size with greater than 50%  respiratory variability, suggesting right atrial pressure of 3 mmHg.   EKG: None today.  Recent Labs: 04/22/2021: ALT 44 05/13/2021: BUN 12; Creatinine, Ser 0.90; Hemoglobin 16.5; Platelets 99.0; Potassium 4.1; Sodium 140  Recent Lipid Panel    Component Value Date/Time   CHOL 108 12/29/2020 0807   CHOL 107 01/07/2013 0840   TRIG 54 12/29/2020 0807   TRIG 59 03/14/2017 0802   TRIG 47 01/07/2013 0840   HDL 55 12/29/2020 0807   HDL 49 03/14/2017 0802   HDL 53 01/07/2013 0840   CHOLHDL 2.0 12/29/2020 0807   LDLCALC 40  12/29/2020 0807   LDLCALC 44 01/03/2014 0803   LDLCALC 45 01/07/2013 0840    ASSESSMENT & PLAN    Paroxysmal atrial fibrillation-heart rate today 70.  Denies recent episodes of irregular or fast heartbeat.  Cardiac event monitor 04/30/2021 showed predominant sinus rhythm with no atrial fibrillation.  We will continue to hold off on anticoagulation until after EGD.  This patients CHA2DS2-VASc Score and unadjusted Ischemic Stroke Rate (% per year) is equal to 4.8 % stroke rate/year from a score of 4 Above score calculated as 1 point each if present [CHF, HTN, DM,  Age if 73-74] Continue diltiazem Avoid triggers caffeine, chocolate, EtOH, dehydration etc. Heart healthy low-sodium diet-salty 6 given Increase physical activity as tolerated Plan to start Eliquis 5 mg twice daily after EGD.-Prescription provided to start after EGD.  Obstructive sleep apnea-reports compliance with CPAP.  Waking up well rested. Continue CPAP use  Chest discomfort-resolved.  Recently seen and evaluated in the emergency department.  Cardiac troponins were negative.  Appears to be related to demand ischemia secondary to paroxysmal atrial fibrillation/atrial tachycardia. No plans for ischemic evaluation at this time. Continue to monitor  Aortic sclerosis-no increased DOE or activity intolerance.  Echocardiogram 2021 showed moderate aortic stenosis. Repeat echocardiogram when clinically indicated.  Disposition: Follow-up with Dr. Percival Spanish  or me in 3-4 months.      Medication Adjustments/Labs and Tests Ordered: Current medicines are reviewed at length with the patient today.  Concerns regarding medicines are outlined above.  No orders of the defined types were placed in this encounter.  Meds ordered this encounter  Medications   apixaban (ELIQUIS) 5 MG TABS tablet    Sig: Take 1 tablet (5 mg total) by mouth 2 (two) times daily.    Dispense:  180 tablet    Refill:  1    Patient Instructions  Medication  Instructions:   Start Eliquis 5 mg twice daily---after your procedures.  Continue taking Aspirin 81 mg until you start the Eliquis, then stop Aspirin.  *If you need a refill on your cardiac medications before your next appointment, please call your pharmacy*   Follow-Up: At [facility], you and your health needs are our priority.  As part of our continuing mission to provide you with exceptional heart care, we have created designated Provider Care Teams.  These Care Teams include your primary Cardiologist (physician) and Advanced Practice Providers (APPs -  Physician Assistants and Nurse Practitioners) who all work together to provide you with the care you need, when you need it.  We recommend signing up for the patient portal called "MyChart".  Sign up information is provided on this After Visit Summary.  MyChart is used to connect with patients for Virtual Visits (Telemedicine).  Patients are able to view lab/test results, encounter notes, upcoming appointments, etc.  Non-urgent messages can be sent to your provider as well.   To learn more about what you can do with MyChart, go to [email or web address].    Your next appointment:   3-4 month(s)  The format for your next appointment:   In Person  Provider:   You may see Minus Breeding, MD or one of the following Advanced Practice Providers on your designated Care Team:   Rosaria Ferries, PA-C Caron Presume, PA-C Jory Sims, DNP, ANP    Other Instructions Palpitations Palpitations are feelings that your heartbeat is not normal. Your heartbeat may feel like it is: Uneven. Faster than normal. Fluttering. Skipping a beat. This is usually not a serious problem. In some cases, you may need tests to rule out any serious problems.  Follow these instructions at home: Pay attention to any changes in your condition. Take these actions to help manage your symptoms:  Eating and drinking Avoid: Coffee, tea, soft drinks,  and energy drinks. Chocolate. Alcohol. Diet pills.   Lifestyle  Try to lower your stress. These things can help you relax: Yoga. Deep breathing and meditation. Exercise. Using words and images to create positive thoughts (guided imagery). Using your mind to control things in your body (biofeedback). Do not use drugs.       3.Get plenty of rest and sleep. Keep a regular bed time.  General instructions  Take over-the-counter and prescription medicines only as told by your doctor. Do not use any products that contain nicotine or tobacco, such as cigarettes and e-cigarettes. If you need help quitting, ask your doctor. Keep all follow-up visits as told by your doctor. This is important. You may need more tests if palpitations do not go away or get worse. Contact a doctor if: Your symptoms last more than 24 hours. Your symptoms occur more often. Get help right away if you: Have chest pain. Feel short of breath. Have a very bad headache. Feel dizzy. Pass out (faint). Summary Palpitations are feelings that your  heartbeat is uneven or faster than normal. It may feel like your heart is fluttering or skipping a beat. Avoid food and drinks that may cause palpitations. These include caffeine, chocolate, and alcohol. Try to lower your stress. Do not smoke or use drugs. Get help right away if you faint or have chest pain, shortness of breath, a severe headache, or dizziness. This information is not intended to replace advice given to you by your health care provider. Make sure you discuss any questions you have with your health care provider. Document Revised: 12/11/2020 Document Reviewed: 09/27/2017 Elsevier Patient Education  2022 [place], Deberah Pelton, NP  05/19/2021 12:00 PM      Notice: This dictation was prepared with Dragon dictation along with smaller phrase technology. Any transcriptional errors that result from this process are unintentional and may not be  corrected upon review.  I spent 15 minutes examining this patient, reviewing medications, and using patient centered shared decision making involving her cardiac care.  Prior to her visit I spent greater than 20 minutes reviewing her past medical history,  medications, and prior cardiac tests.

## 2021-05-13 ENCOUNTER — Other Ambulatory Visit (INDEPENDENT_AMBULATORY_CARE_PROVIDER_SITE_OTHER): Payer: BC Managed Care – PPO

## 2021-05-13 DIAGNOSIS — R197 Diarrhea, unspecified: Secondary | ICD-10-CM

## 2021-05-13 DIAGNOSIS — R509 Fever, unspecified: Secondary | ICD-10-CM

## 2021-05-13 LAB — CBC WITH DIFFERENTIAL/PLATELET
Basophils Absolute: 0 10*3/uL (ref 0.0–0.1)
Basophils Relative: 0.8 % (ref 0.0–3.0)
Eosinophils Absolute: 0.1 10*3/uL (ref 0.0–0.7)
Eosinophils Relative: 1.5 % (ref 0.0–5.0)
HCT: 48.8 % (ref 39.0–52.0)
Hemoglobin: 16.5 g/dL (ref 13.0–17.0)
Lymphocytes Relative: 19.3 % (ref 12.0–46.0)
Lymphs Abs: 1.2 10*3/uL (ref 0.7–4.0)
MCHC: 33.8 g/dL (ref 30.0–36.0)
MCV: 103.5 fl — ABNORMAL HIGH (ref 78.0–100.0)
Monocytes Absolute: 1 10*3/uL (ref 0.1–1.0)
Monocytes Relative: 16.9 % — ABNORMAL HIGH (ref 3.0–12.0)
Neutro Abs: 3.7 10*3/uL (ref 1.4–7.7)
Neutrophils Relative %: 61.5 % (ref 43.0–77.0)
Platelets: 99 10*3/uL — ABNORMAL LOW (ref 150.0–400.0)
RBC: 4.71 Mil/uL (ref 4.22–5.81)
RDW: 14.7 % (ref 11.5–15.5)
WBC: 6.1 10*3/uL (ref 4.0–10.5)

## 2021-05-13 LAB — BASIC METABOLIC PANEL
BUN: 12 mg/dL (ref 6–23)
CO2: 30 mEq/L (ref 19–32)
Calcium: 9.3 mg/dL (ref 8.4–10.5)
Chloride: 104 mEq/L (ref 96–112)
Creatinine, Ser: 0.9 mg/dL (ref 0.40–1.50)
GFR: 85.87 mL/min (ref >60.00)
Glucose, Bld: 171 mg/dL — ABNORMAL HIGH (ref 70–99)
Potassium: 4.1 mEq/L (ref 3.5–5.1)
Sodium: 140 mEq/L (ref 135–145)

## 2021-05-13 LAB — SEDIMENTATION RATE: Sed Rate: 21 mm/hr — ABNORMAL HIGH (ref 0–20)

## 2021-05-19 ENCOUNTER — Ambulatory Visit (INDEPENDENT_AMBULATORY_CARE_PROVIDER_SITE_OTHER): Payer: BC Managed Care – PPO | Admitting: General Practice

## 2021-05-19 ENCOUNTER — Other Ambulatory Visit: Payer: Self-pay

## 2021-05-19 ENCOUNTER — Encounter (HOSPITAL_BASED_OUTPATIENT_CLINIC_OR_DEPARTMENT_OTHER): Payer: Self-pay | Admitting: General Practice

## 2021-05-19 VITALS — BP 112/60 | HR 70 | Ht 70.0 in | Wt 231.5 lb

## 2021-05-19 DIAGNOSIS — I480 Paroxysmal atrial fibrillation: Secondary | ICD-10-CM

## 2021-05-19 DIAGNOSIS — I350 Nonrheumatic aortic (valve) stenosis: Secondary | ICD-10-CM | POA: Diagnosis not present

## 2021-05-19 DIAGNOSIS — Z9989 Dependence on other enabling machines and devices: Secondary | ICD-10-CM

## 2021-05-19 DIAGNOSIS — R072 Precordial pain: Secondary | ICD-10-CM | POA: Diagnosis not present

## 2021-05-19 DIAGNOSIS — G4733 Obstructive sleep apnea (adult) (pediatric): Secondary | ICD-10-CM | POA: Diagnosis not present

## 2021-05-19 MED ORDER — APIXABAN 5 MG PO TABS: 5.0000 mg | ORAL_TABLET | Freq: Two times a day (BID) | ORAL | 1 refills | Status: DC

## 2021-05-19 NOTE — Patient Instructions (Signed)
Medication Instructions:   Start Eliquis 5 mg twice daily---after your procedures.  Continue taking Aspirin 81 mg until you start the Eliquis, then stop Aspirin.  *If you need a refill on your cardiac medications before your next appointment, please call your pharmacy*   Follow-Up: At [facility], you and your health needs are our priority.  As part of our continuing mission to provide you with exceptional heart care, we have created designated Provider Care Teams.  These Care Teams include your primary Cardiologist (physician) and Advanced Practice Providers (APPs -  Physician Assistants and Nurse Practitioners) who all work together to provide you with the care you need, when you need it.  We recommend signing up for the patient portal called "MyChart".  Sign up information is provided on this After Visit Summary.  MyChart is used to connect with patients for Virtual Visits (Telemedicine).  Patients are able to view lab/test results, encounter notes, upcoming appointments, etc.  Non-urgent messages can be sent to your provider as well.   To learn more about what you can do with MyChart, go to [email or web address].    Your next appointment:   3-4 month(s)  The format for your next appointment:   In Person  Provider:   You may see Minus Breeding, MD or one of the following Advanced Practice Providers on your designated Care Team:   Rosaria Ferries, PA-C Caron Presume, PA-C Jory Sims, DNP, ANP    Other Instructions Palpitations Palpitations are feelings that your heartbeat is not normal. Your heartbeat may feel like it is: Uneven. Faster than normal. Fluttering. Skipping a beat. This is usually not a serious problem. In some cases, you may need tests to rule out any serious problems.  Follow these instructions at home: Pay attention to any changes in your condition. Take these actions to help manage your symptoms:  Eating and drinking Avoid: Coffee, tea, soft  drinks, and energy drinks. Chocolate. Alcohol. Diet pills.   Lifestyle  Try to lower your stress. These things can help you relax: Yoga. Deep breathing and meditation. Exercise. Using words and images to create positive thoughts (guided imagery). Using your mind to control things in your body (biofeedback). Do not use drugs.       3.Get plenty of rest and sleep. Keep a regular bed time.  General instructions  Take over-the-counter and prescription medicines only as told by your doctor. Do not use any products that contain nicotine or tobacco, such as cigarettes and e-cigarettes. If you need help quitting, ask your doctor. Keep all follow-up visits as told by your doctor. This is important. You may need more tests if palpitations do not go away or get worse. Contact a doctor if: Your symptoms last more than 24 hours. Your symptoms occur more often. Get help right away if you: Have chest pain. Feel short of breath. Have a very bad headache. Feel dizzy. Pass out (faint). Summary Palpitations are feelings that your heartbeat is uneven or faster than normal. It may feel like your heart is fluttering or skipping a beat. Avoid food and drinks that may cause palpitations. These include caffeine, chocolate, and alcohol. Try to lower your stress. Do not smoke or use drugs. Get help right away if you faint or have chest pain, shortness of breath, a severe headache, or dizziness. This information is not intended to replace advice given to you by your health care provider. Make sure you discuss any questions you have with your health care provider. Document Revised: 12/11/2020  Document Reviewed: 09/27/2017 Elsevier Patient Education  [place].

## 2021-05-23 DIAGNOSIS — E109 Type 1 diabetes mellitus without complications: Secondary | ICD-10-CM | POA: Diagnosis not present

## 2021-05-25 DIAGNOSIS — G4733 Obstructive sleep apnea (adult) (pediatric): Secondary | ICD-10-CM | POA: Diagnosis not present

## 2021-05-27 ENCOUNTER — Telehealth: Payer: Self-pay

## 2021-05-27 ENCOUNTER — Other Ambulatory Visit (INDEPENDENT_AMBULATORY_CARE_PROVIDER_SITE_OTHER): Payer: BC Managed Care – PPO

## 2021-05-27 ENCOUNTER — Encounter: Payer: Self-pay | Admitting: Physician Assistant

## 2021-05-27 ENCOUNTER — Ambulatory Visit (INDEPENDENT_AMBULATORY_CARE_PROVIDER_SITE_OTHER): Payer: BC Managed Care – PPO | Admitting: Physician Assistant

## 2021-05-27 VITALS — BP 122/60 | HR 64 | Ht 67.75 in | Wt 234.4 lb

## 2021-05-27 DIAGNOSIS — K760 Fatty (change of) liver, not elsewhere classified: Secondary | ICD-10-CM | POA: Diagnosis not present

## 2021-05-27 DIAGNOSIS — Z1211 Encounter for screening for malignant neoplasm of colon: Secondary | ICD-10-CM

## 2021-05-27 DIAGNOSIS — Z8619 Personal history of other infectious and parasitic diseases: Secondary | ICD-10-CM

## 2021-05-27 DIAGNOSIS — K7460 Unspecified cirrhosis of liver: Secondary | ICD-10-CM

## 2021-05-27 DIAGNOSIS — Z86010 Personal history of colonic polyps: Secondary | ICD-10-CM | POA: Diagnosis not present

## 2021-05-27 LAB — PROTIME-INR
INR: 1.1 ratio — ABNORMAL HIGH (ref 0.8–1.0)
Prothrombin Time: 12.1 s (ref 9.6–13.1)

## 2021-05-27 NOTE — Progress Notes (Signed)
Subjective:    Patient ID: Tony Cantu, male    DOB: Nov 15, 1950, 70 y.o.   MRN: [id number]  HPI Tony is a 70 year old white male, established with Dr. Hilarie Fredrickson who comes in for follow-up today after being seen on 04/22/2021 by myself with an acute illness of 2 to 3-week duration.  He had initially been having some substernal chest discomfort shortness of breath and sensation of heart racing and was found to be in atrial fibrillation.  Along with that he was also having heartburn and indigestion upper abdominal discomfort and postprandial diarrhea with at least 6 bowel movements per day.  He was also having intermittent fevers over the previous week.  Home COVID test had been negative.  He underwent labs and GI path panel, and Protonix was increased to twice daily.  Appendectomy was also scheduled for CT. CT of the abdomen and pelvis showed no acute findings in the abdomen or pelvis to explain his diarrhea, he has diverticulosis without diverticulitis, stigmata of cirrhosis and portal hypertension including gastroesophageal varices and nonobstructive left nephrolithiasis. GI path panel returned positive for C. difficile and also for Shiga toxin producing E. coli.  He was started on a course of vancomycin x14 days. He had repeat labs done on 05/13/2021 with sed rate of 21, WBC 6.1 hemoglobin 16 hematocrit of 48. He says over the past couple of weeks he has been feeling better.  He says he is still having intermittent diarrhea but on further questioning it sounds as if he has days of more frequent bowel movement but the stools are all formed.  He says he had 1 day of more frequent stools about 4 to 5 days ago and then the following day bowel movement was very normal with just 1 bowel movement that day.  He has not had any further fever or chills, he has no current complaints of abdominal pain and indigestion has settled down as well. He says that he was seen by cardiology and they are planning to initiate  Eliquis because of the episode of atrial fibrillation.  He is not sure that he wants to proceed with that.  He says it was suggested that he may want to complete any endoscopic evaluation prior to that time. He has last EGD was done in August 2020.  He was found to have 1 cratered gastric ulcer nonbleeding.  Biopsies were negative, no H. pylori.  No evidence of esophageal varices. Last colonoscopy also August 2020 with removal of 4 polyps and noted to have multiple diverticuli.  He is indicated for 3-year interval follow-up.  Review of Systems. Pertinent positive and negative review of systems were noted in the above HPI section.  All other review of systems was otherwise negative.   Outpatient Encounter Medications as of 05/27/2021  Medication Sig   albuterol (VENTOLIN HFA) 108 (90 Base) MCG/ACT inhaler Inhale 2 puffs into the lungs every 6 (six) hours as needed for wheezing or shortness of breath.   aspirin 81 MG chewable tablet Chew by mouth daily.   atorvastatin (LIPITOR) 40 MG tablet TAKE 1 TABLET BY MOUTH  DAILY   b complex vitamins tablet Take 1 tablet by mouth daily at 12 noon.   Blood Glucose Monitoring Suppl (CONTOUR NEXT ONE) KIT Test BS QID and as needed Dx E11.9   Calcium Carbonate-Vitamin D 600-400 MG-UNIT tablet Take 1 tablet by mouth daily.   Cholecalciferol (VITAMIN D3) 2000 UNITS capsule Take 2,000 Units by mouth daily at 12 noon.  Continuous Blood Gluc Receiver (FREESTYLE LIBRE READER) DEVI 1 applicator by Does not apply route as directed.   Continuous Blood Gluc Sensor (FREESTYLE LIBRE SENSOR SYSTEM) MISC Check BS eight (8) times a day. Dx E10.9   diltiazem (CARDIZEM) 30 MG tablet Take 1 tablet (30 mg total) by mouth 2 (two) times daily.   furosemide (LASIX) 20 MG tablet TAKE 1 AND 1/2 TABLETS BY  MOUTH DAILY   Glucagon, rDNA, (GLUCAGON EMERGENCY) 1 MG KIT Inject 1 mg into the skin Once PRN for up to 1 dose.   Glucosamine-Chondroit-Vit C-Mn (GLUCOSAMINE 1500 COMPLEX) CAPS Take  2 capsules by mouth daily at 12 noon.   glucose blood (ONETOUCH ULTRA) test strip Use as needed E11.9   insulin lispro (HUMALOG) 100 UNIT/ML injection INJECT SUBCUTANEOUSLY 20 TO 50 UNITS 3 TIMES DAILY  BEFORE MEALS PER SLIDING  SCALE (Patient taking differently: 6-30 Units. INJECT SUBCUTANEOUSLY 6-30 UNITS 7-8 TIMES DAILY  BEFORE MEALS PER SLIDING  SCALE)   Microlet Lancets MISC Test BS QID and as needed Dx E11.9   montelukast (SINGULAIR) 10 MG tablet TAKE 1 TABLET BY MOUTH AT  BEDTIME   multivitamin-lutein (OCUVITE-LUTEIN) CAPS capsule Take 1 capsule by mouth daily at 12 noon.   pantoprazole (PROTONIX) 40 MG tablet Take 1 tablet (40 mg total) by mouth 2 (two) times daily before a meal.   Testosterone 30 MG/ACT SOLN APPLY ONE PUMP TOPICALLY  UNDER EACH AXILLA DAILY  (TOTAL OF 60MG DAILY )   TOUJEO MAX SOLOSTAR 300 UNIT/ML Solostar Pen INJECT 110 UNITS INTO THE  SKIN DAILY (Patient taking differently: Inject 80 Units into the skin daily. 8 pm)   valsartan (DIOVAN) 160 MG tablet Take 80 mg by mouth daily.   apixaban (ELIQUIS) 5 MG TABS tablet Take 1 tablet (5 mg total) by mouth 2 (two) times daily. (Patient not taking: Reported on 05/27/2021)   No facility-administered encounter medications on file as of 05/27/2021.   Allergies  Allergen Reactions   Erythromycin Diarrhea   Sulfonamide Derivatives Diarrhea   Patient Active Problem List   Diagnosis Date Noted   Precordial chest pain 04/20/2021   Former smoker 01/12/2021   PAF (paroxysmal atrial fibrillation) ([place]) 06/22/2020   Hypertension associated with type 2 diabetes mellitus ([place]) 05/17/2019   Hyperlipidemia associated with type 2 diabetes mellitus ([place]) 05/17/2019   GERD with esophagitis 05/17/2019   H/O total shoulder replacement, right 11/01/2018   Paroxysmal tachycardia ([place]) 01/11/2017   Nonrheumatic aortic valve stenosis 01/11/2017   Aortic atherosclerosis ([place]) 11/23/2016   HNP (herniated nucleus pulposus), lumbar 10/06/2016    Acute renal injury ([place]) 08/06/2016   Diarrhea 08/06/2016   Fever 08/06/2016   Hyperbilirubinemia 08/06/2016   Lactic acidosis 08/06/2016   Inguinal hernia 12/10/2015   Right groin pain 11/30/2015   Carpal tunnel syndrome on right 09/09/2015   Cervical spondylosis without myelopathy 09/09/2015   Thrombocytopenia ([place]) 07/21/2014   Bilateral carotid bruits 05/11/2014   Vitamin D deficiency 09/17/2013   BPH (benign prostatic hyperplasia) 05/22/2013   Low serum testosterone level 02/18/2013   Diabetes type 2, controlled ([place]) 01/10/2013   OSA (obstructive sleep apnea) 05/16/2012   Edema 02/21/2012   At risk for coronary artery disease 03/20/2011   Obesity 03/20/2011   Allergic rhinitis 06/22/2010   ASTHMA 06/22/2010   COUGH 06/22/2010   Social History   Socioeconomic History   Marital status: Married    Spouse name: Not on file   Number of children: Not on file   Years of  education: Not on file   Highest education level: Not on file  Occupational History   Occupation: retired   Tobacco Use   Smoking status: Former    Years: 34.00    Types: Cigarettes, Pipe    Quit date: 2005    Years since quitting: 17.7   Smokeless tobacco: Never   Tobacco comments:    quit 2005 smoked cigarettes for 5 yrs prior to pipe use  Vaping Use   Vaping Use: Never used  Substance and Sexual Activity   Alcohol use: No    Comment: once a year   Drug use: No   Sexual activity: Yes  Other Topics Concern   Not on file  Social History Narrative   ** Merged History Encounter **       Social Determinants of Health   Financial Resource Strain: Not on file  Food Insecurity: Not on file  Transportation Needs: Not on file  Physical Activity: Not on file  Stress: Not on file  Social Connections: Not on file  Intimate Partner Violence: Not on file    Tony Cantu family history includes Allergic rhinitis in his sister; Alzheimer's disease in his mother; Asthma in his father; Breast cancer in  his maternal grandmother; Diabetes in his mother; Heart attack in his maternal grandmother and mother; Heart failure in his maternal grandmother and mother; Rheum arthritis in his maternal grandmother; Suicidality in his father.      Objective:    Vitals:   05/27/21 1313  BP: 122/60  Pulse: 64    Physical Exam  Well-developed well-nourished older white male in no acute distress Patient is here alone today.  Height, Weight, 234 BMI 35.9  HEENT; nontraumatic normocephalic, EOMI, PE R LA, sclera anicteric. Oropharynx; not examined today Neck; supple, no JVD Cardiovascular; regular rate and rhythm with S1-S2, no murmur rub or gallop Pulmonary; Clear bilaterally Abdomen; soft, obese, nontender, nondistended, no palpable mass or hepatosplenomegaly, bowel sounds are active Rectal; not done today Skin; benign exam, no jaundice rash or appreciable lesions Extremities; no clubbing cyanosis or edema skin warm and dry Neuro/Psych; alert and oriented x4, grossly nonfocal mood and affect appropriate        Assessment & Plan:   #71 70 year old white male here for follow-up after recent acute illness with multiple symptoms including upper abdominal discomfort indigestion, fever, and diarrhea.  Patient was C. difficile positive and has completed a 14-day course of vancomycin. He is much improved today, he has had some intermittent increase in stool frequency but is no longer having any diarrhea and stools are formed.  Fever has resolved he has no complaints of abdominal discomfort.  #2 history of adenomatous colon polyps-up-to-date with colon surveillance, will be due for colonoscopy August 2023 #3 recent episode of atrial fibrillation-cardiology intends to start Eliquis #4 cirrhosis, compensated, secondary to nonalcoholic fatty liver disease-due for repeat EGD for variceal surveillance.  Recent CT did show evidence of portal hypertension and gastroesophageal varices.  #5 adult onset diabetes  mellitus #6.  Hypertension #7.  History of aortic valve stenosis #8 BPH  Plan; check pro time/INR and AFP today Patient will be scheduled for upper endoscopy with Dr. Hilarie Fredrickson.  Procedure was discussed in detail with the patient including indications risk and benefits and he is agreeable to proceed.  Patient states that he has not started Eliquis as yet, and does not plan to start until after endoscopic evaluation. Patient advised to call should he have any relapse of diarrhea, abdominal pain  or fever.  At that point he would need to be retested for C. difficile. Continue Protonix 40 mg p.o. twice daily  Eloise Picone S Bronislaw Switzer PA-C 05/27/2021   Cc: [name], DO

## 2021-05-27 NOTE — Patient Instructions (Signed)
If you are age 71 or older, your body mass index should be between 23-30. Your Body mass index is 35.9 kg/m. If this is out of the aforementioned range listed, please consider follow up with your Primary Care Provider. __________________________________________________________  The [facility] providers would like to encourage you to use [facility] to communicate with providers for non-urgent requests or questions.  Due to long hold times on the telephone, sending your provider a message by [facility] may be a faster and more efficient way to get a response.  Please allow 48 business hours for a response.  Please remember that this is for non-urgent requests.   Your provider has requested that you go to the basement level for lab work before leaving today. Press "B" on the elevator. The lab is located at the first door on the left as you exit the elevator.  You have been scheduled for an endoscopy. Please follow written instructions given to you at your visit today. If you use inhalers (even only as needed), please bring them with you on the day of your procedure.  No changes in your medications at this time.  Follow up with Dr. Hilarie Fredrickson or Nicoletta Ba, PA-C as needed.  Thank you for entrusting me with your care and choosing [facility].  Amy Esterwood, PA-C

## 2021-05-27 NOTE — Telephone Encounter (Signed)
Patient with diagnosis of afib on Eliquis for anticoagulation.    Procedure: endoscopy Date of procedure: 07/07/21  CHA2DS2-VASc Score = 4  This indicates a 4.8% annual risk of stroke. The patient's score is based upon: CHF History: 0 HTN History: 1 Diabetes History: 1 Stroke History: 0 Vascular Disease History: 1 Age Score: 1 Gender Score: 0   CrCl 63m/min using adjusted body weight Platelet count 99K  Per office protocol, patient can hold Eliquis for 2 days prior to procedure as requested. Patient should restart Eliquis on the evening of procedure or day after, at discretion of procedure MD.

## 2021-05-27 NOTE — Telephone Encounter (Signed)
[facility] Pre-operative Risk Assessment     Request for surgical clearance:     Endoscopy Procedure  What type of surgery is being performed?     Endoscopy  When is this surgery scheduled?     07/07/2021  What type of clearance is required ?   Pharmacy  Are there any medications that need to be held prior to surgery and how long? 2 days prior  Practice name and name of physician performing surgery?      [facility]  What is your office phone and fax number?      Phone- [phone]  [phone]  Anesthesia type (None, local, MAC, general) ?       MAC

## 2021-05-28 LAB — AFP TUMOR MARKER: AFP-Tumor Marker: 8.7 ng/mL — ABNORMAL HIGH (ref <6.1)

## 2021-05-28 NOTE — Progress Notes (Signed)
Addendum: Reviewed and agree with assessment and management plan. Mercia Dowe M, MD  

## 2021-06-01 ENCOUNTER — Encounter: Payer: Self-pay | Admitting: Family Medicine

## 2021-06-01 NOTE — Telephone Encounter (Signed)
Called and spoke with patient. He has been advised that he is to stop the Eliquis 2 days prior to his procedure. He has let me know that he is currently not on his Eliquis due to multiple procedure that he is having. He also let me know that Cardiology advised after his procedure he will start back after his procedures.

## 2021-06-02 MED ORDER — FREESTYLE LIBRE SENSOR SYSTEM MISC: 3 refills | Status: DC

## 2021-06-03 DIAGNOSIS — E108 Type 1 diabetes mellitus with unspecified complications: Secondary | ICD-10-CM | POA: Diagnosis not present

## 2021-06-06 DIAGNOSIS — R109 Unspecified abdominal pain: Secondary | ICD-10-CM | POA: Diagnosis not present

## 2021-06-06 DIAGNOSIS — Z6834 Body mass index (BMI) 34.0-34.9, adult: Secondary | ICD-10-CM | POA: Diagnosis not present

## 2021-06-08 ENCOUNTER — Encounter: Payer: Self-pay | Admitting: Family Medicine

## 2021-06-09 ENCOUNTER — Other Ambulatory Visit: Payer: Self-pay | Admitting: Family Medicine

## 2021-06-09 DIAGNOSIS — E1122 Type 2 diabetes mellitus with diabetic chronic kidney disease: Secondary | ICD-10-CM

## 2021-06-09 NOTE — Telephone Encounter (Signed)
LMOM for patient to give more info as to fax # for results to be sent to.

## 2021-06-10 ENCOUNTER — Other Ambulatory Visit: Payer: Self-pay | Admitting: Family Medicine

## 2021-06-10 DIAGNOSIS — N200 Calculus of kidney: Secondary | ICD-10-CM

## 2021-06-11 ENCOUNTER — Other Ambulatory Visit: Payer: Self-pay | Admitting: Family Medicine

## 2021-06-11 DIAGNOSIS — N200 Calculus of kidney: Secondary | ICD-10-CM

## 2021-06-12 ENCOUNTER — Other Ambulatory Visit: Payer: Self-pay | Admitting: Family Medicine

## 2021-06-12 DIAGNOSIS — E1122 Type 2 diabetes mellitus with diabetic chronic kidney disease: Secondary | ICD-10-CM

## 2021-06-14 ENCOUNTER — Other Ambulatory Visit: Payer: Self-pay | Admitting: Otolaryngology

## 2021-06-14 ENCOUNTER — Ambulatory Visit (HOSPITAL_COMMUNITY): Payer: Medicare Other

## 2021-06-14 DIAGNOSIS — E119 Type 2 diabetes mellitus without complications: Secondary | ICD-10-CM | POA: Diagnosis not present

## 2021-06-15 ENCOUNTER — Ambulatory Visit (HOSPITAL_COMMUNITY)
Admission: RE | Admit: 2021-06-15 | Discharge: 2021-06-15 | Disposition: A | Discharge status: Home / Self Care | Payer: BC Managed Care – PPO | Source: Ambulatory Visit | Attending: Family Medicine | Admitting: Family Medicine

## 2021-06-15 ENCOUNTER — Encounter: Payer: Self-pay | Admitting: Family Medicine

## 2021-06-15 ENCOUNTER — Other Ambulatory Visit: Payer: Self-pay

## 2021-06-15 DIAGNOSIS — N281 Cyst of kidney, acquired: Secondary | ICD-10-CM | POA: Diagnosis not present

## 2021-06-15 DIAGNOSIS — N200 Calculus of kidney: Secondary | ICD-10-CM | POA: Diagnosis not present

## 2021-06-16 ENCOUNTER — Other Ambulatory Visit: Payer: Self-pay | Admitting: Family Medicine

## 2021-06-16 DIAGNOSIS — N200 Calculus of kidney: Secondary | ICD-10-CM

## 2021-06-22 ENCOUNTER — Ambulatory Visit: Payer: BC Managed Care – PPO | Admitting: Pulmonary Disease

## 2021-06-22 DIAGNOSIS — E109 Type 1 diabetes mellitus without complications: Secondary | ICD-10-CM | POA: Diagnosis not present

## 2021-06-25 DIAGNOSIS — E108 Type 1 diabetes mellitus with unspecified complications: Secondary | ICD-10-CM | POA: Diagnosis not present

## 2021-06-30 ENCOUNTER — Other Ambulatory Visit: Payer: Self-pay

## 2021-06-30 ENCOUNTER — Encounter (HOSPITAL_COMMUNITY): Payer: Self-pay | Admitting: Otolaryngology

## 2021-06-30 NOTE — Progress Notes (Signed)
PCP - Dr. Bernadene Person  Cardiologist - Dr. Percival Spanish  EP-Denies  Endocrine-Denies  Pulm-Denies  Chest x-ray - 04/16/21 (E)  EKG - 04/21/21 (E)  Stress Test - Denies  ECHO - 07/02/20 (E)  Cardiac Cath - Denies  AICD-na PM-na LOOP-na  Dialysis-Denies  Sleep Study - Yes- Positive CPAP - Yes  LABS- 07/02/21: CBC, BMP  ASA- LD- 11/2 Eliquis- LD- 2017, pt will resume after his procedures  ERAS- No  HA1C- 04/13/21 (E): 5.0 Fasting Blood Sugar - 46-120 Checks Blood Sugar __every 2___ hours  Anesthesia- Yes- EKG  Pt denies having chest pain, sob, or fever during the pre-op phone call. All instructions explained to the pt, with a verbal understanding of the material including: as of today, stop taking all Aspirin (unless instructed by your doctor) and Other Aspirin containing products, Vitamins, Fish oils, and Herbal medications. Also stop all NSAIDS i.e. Advil, Ibuprofen, Motrin, Aleve, Anaprox, Naproxen, BC, Goody Powders, and all Supplements.    WHAT DO I DO ABOUT MY DIABETES MEDICATION?  THE NIGHT BEFORE SURGERY, take _____64______ units of _____Toujeo______insulin.  If your CBG is greater than 220 mg/dL, you may take  (10-16 units) of your Humalog insulin.  How do I manage my blood sugar before surgery? Check your blood sugar the morning of your surgery when you wake up and every 2 hours until you get to the Short Stay unit. If your blood sugar is less than 70 mg/dL, you will need to treat for low blood sugar: Do not take insulin. Treat a low blood sugar (less than 70 mg/dL) with  cup of clear juice (cranberry or apple), 4 glucose tablets, OR glucose gel. Recheck blood sugar in 15 minutes after treatment (to make sure it is greater than 70 mg/dL). If your blood sugar is not greater than 70 mg/dL on recheck, call [phone]  for further instructions. Report your blood sugar to the short stay nurse when you get to Short Stay.   Reviewed and Endorsed by [facility]  Patient Education Committee, August 2015  Pt also instructed to wear a mask and social distance if he has to go out prior to his surgery. The opportunity to ask questions was provided.    Coronavirus Screening  Have you experienced the following symptoms:  Cough yes/no: No Fever (>100.49F)  yes/no: No Runny nose yes/no: No Sore throat yes/no: No Difficulty breathing/shortness of breath  yes/no: No  Have you or a family member traveled in the last 14 days and where? yes/no: No   If the patient indicates "YES" to the above questions, their PAT will be rescheduled to limit the exposure to others and, the surgeon will be notified. THE PATIENT WILL NEED TO BE ASYMPTOMATIC FOR 14 DAYS.   If the patient is not experiencing any of these symptoms, the PAT nurse will instruct them to NOT bring anyone with them to their appointment since they may have these symptoms or traveled as well.   Please remind your patients and families that hospital visitation restrictions are in effect and the importance of the restrictions.

## 2021-07-01 NOTE — Progress Notes (Signed)
Anesthesia Chart Review: Same day workup  Follows with cardiology for history of paroxysmal A. fib, moderate AS (mean gradient 20.7 mmHg by echo 06/2020), HTN, HLD, OSA on CPAP.  Last seen 05/19/2021 by Coletta Memos, NP to discuss paroxysmal A. fib and anticoagulation.  Per note, it was decided to hold off on anticoagulation until after his upcoming endoscopic procedure.  He was also noted to have some recent chest discomfort which resolved.  He was evaluated in the ED for this and troponins were negative.  Felt due to demand ischemia secondary to paroxysmal atrial fibrillation/atrial tachycardia.  There were no plans at that time for ischemic evaluation.  Insulin-dependent diabetes.  History in epic states type I, however PCP notes state patient has type 2 diabetes. Last A1c in Epic 5.0 on 04/13/21.  Pt will need DOS labs and eval.   EKG 04/21/2021: Atrial fibrillation.  Ventricular rate 131.  Event monitor 04/30/21: Predominant rhythm was normal sinus SVT noted with the longest run being 7 beats Fairly frequent ventricular ectopy without runs. Runs of ventricular bigeminy and trigeminy.  No sustained arrhythmias.   TTE 07/02/20:  1. Left ventricular ejection fraction, by estimation, is 65 to 70%. The  left ventricle has normal function. The left ventricle has no regional  Larin motion abnormalities. Left ventricular diastolic parameters are  consistent with Grade I diastolic  dysfunction (impaired relaxation).   2. Right ventricular systolic function is normal. The right ventricular  size is normal.   3. Left atrial size was mildly dilated.   4. The mitral valve is normal in structure. No evidence of mitral valve  regurgitation. No evidence of mitral stenosis.   5. The aortic valve is tricuspid. There is moderate calcification of the  aortic valve. There is moderate thickening of the aortic valve. Aortic  valve regurgitation is not visualized. Moderate aortic valve stenosis.   6. The  inferior vena cava is normal in size with greater than 50%  respiratory variability, suggesting right atrial pressure of 3 mmHg.   Carotid duplex 04/29/19: Summary:  Right Carotid: Velocities in the right ICA are consistent with a 1-39% stenosis.  Left Carotid: Velocities in the left ICA are consistent with a 1-39% stenosis.  Vertebrals:  Bilateral vertebral arteries demonstrate antegrade flow.  Subclavians: Normal flow hemodynamics were seen in bilateral subclavian arteries.    Wynonia Musty [facility] Short Stay Center/Anesthesiology Phone [phone] 07/01/2021 11:43 AM

## 2021-07-01 NOTE — Anesthesia Preprocedure Evaluation (Addendum)
Anesthesia Evaluation  Patient identified by MRN, date of birth, ID band Patient awake    Reviewed: Allergy & Precautions, NPO status , Patient's Chart, lab work & pertinent test results  History of Anesthesia Complications Negative for: history of anesthetic complications  Airway Mallampati: IV  TM Distance: >3 FB Neck ROM: Full    Dental  (+) Teeth Intact   Pulmonary asthma , sleep apnea and Continuous Positive Airway Pressure Ventilation , former smoker,    Pulmonary exam normal        Cardiovascular hypertension, Pt. on medications Normal cardiovascular exam+ dysrhythmias Atrial Fibrillation + Valvular Problems/Murmurs AS    Echo 07/02/20: EF 65-70%, no RWMA, g1dd, nl RVSF, mild LAE, mod AS (MG 20)   Neuro/Psych negative neurological ROS     GI/Hepatic PUD, GERD  ,(+) Cirrhosis       ,   Endo/Other  diabetes, Well Controlled, Insulin Dependent  Renal/GU negative Renal ROS  negative genitourinary   Musculoskeletal  (+) Arthritis ,   Abdominal   Peds  Hematology negative hematology ROS (+) Eliquis   Anesthesia Other Findings   Reproductive/Obstetrics                           Anesthesia Physical Anesthesia Plan  ASA: 3  Anesthesia Plan: MAC   Post-op Pain Management:    Induction: Intravenous  PONV Risk Score and Plan: 1 and Propofol infusion, TIVA and Treatment may vary due to age or medical condition  Airway Management Planned: Natural Airway, Nasal Cannula and Simple Face Mask  Additional Equipment: None  Intra-op Plan:   Post-operative Plan:   Informed Consent: I have reviewed the patients History and Physical, chart, labs and discussed the procedure including the risks, benefits and alternatives for the proposed anesthesia with the patient or authorized representative who has indicated his/her understanding and acceptance.       Plan Discussed with:    Anesthesia Plan Comments: (PAT note by Karoline Caldwell, PA-C: Follows with cardiology for history of paroxysmal A. fib, moderate AS (mean gradient 20.7 mmHg by echo 06/2020), HTN, HLD, OSA on CPAP.  Last seen 05/19/2021 by Coletta Memos, NP to discuss paroxysmal A. fib and anticoagulation.  Per note, it was decided to hold off on anticoagulation until after his upcoming endoscopic procedure.  He was also noted to have some recent chest discomfort which resolved.  He was evaluated in the ED for this and troponins were negative.  Felt due to demand ischemia secondary to paroxysmal atrial fibrillation/atrial tachycardia.  There were no plans at that time for ischemic evaluation.  Insulin-dependent diabetes.  History in epic states type I, however PCP notes state patient has type 2 diabetes. Last A1c in Epic 5.0 on 04/13/21.  Pt will need DOS labs and eval.   EKG 04/21/2021: Atrial fibrillation.  Ventricular rate 131.  Event monitor 04/30/21: Predominant rhythm was normal sinus SVT noted with the longest run being 7 beats Fairly frequent ventricular ectopy without runs. Runs of ventricular bigeminy and trigeminy.  No sustained arrhythmias.   TTE 07/02/20: 1. Left ventricular ejection fraction, by estimation, is 65 to 70%. The  left ventricle has normal function. The left ventricle has no regional  Vidana motion abnormalities. Left ventricular diastolic parameters are  consistent with Grade I diastolic  dysfunction (impaired relaxation).  2. Right ventricular systolic function is normal. The right ventricular  size is normal.  3. Left atrial size was mildly dilated.  4.  The mitral valve is normal in structure. No evidence of mitral valve  regurgitation. No evidence of mitral stenosis.  5. The aortic valve is tricuspid. There is moderate calcification of the  aortic valve. There is moderate thickening of the aortic valve. Aortic  valve regurgitation is not visualized. Moderate aortic valve  stenosis.  6. The inferior vena cava is normal in size with greater than 50%  respiratory variability, suggesting right atrial pressure of 3 mmHg.   Carotid duplex 04/29/19: Summary:  Right Carotid: Velocities in the right ICA are consistent with a 1-39% stenosis.  Left Carotid: Velocities in the left ICA are consistent with a 1-39% stenosis.  Vertebrals: Bilateral vertebral arteries demonstrate antegrade flow.  Subclavians: Normal flow hemodynamics were seen in bilateral subclavian arteries.  )       Anesthesia Quick Evaluation

## 2021-07-02 ENCOUNTER — Ambulatory Visit (HOSPITAL_COMMUNITY)
Admission: RE | Admit: 2021-07-02 | Discharge: 2021-07-02 | Disposition: A | Discharge status: Home / Self Care | Payer: BC Managed Care – PPO | Attending: Otolaryngology | Admitting: Otolaryngology

## 2021-07-02 ENCOUNTER — Ambulatory Visit (HOSPITAL_COMMUNITY): Payer: BC Managed Care – PPO | Admitting: Anesthesiology

## 2021-07-02 ENCOUNTER — Other Ambulatory Visit: Payer: Self-pay

## 2021-07-02 ENCOUNTER — Encounter (HOSPITAL_COMMUNITY)
Admission: RE | Disposition: A | Discharge status: Home / Self Care | Payer: Self-pay | Source: Home / Self Care | Attending: Otolaryngology

## 2021-07-02 DIAGNOSIS — Z7901 Long term (current) use of anticoagulants: Secondary | ICD-10-CM | POA: Insufficient documentation

## 2021-07-02 DIAGNOSIS — Z794 Long term (current) use of insulin: Secondary | ICD-10-CM | POA: Insufficient documentation

## 2021-07-02 DIAGNOSIS — K219 Gastro-esophageal reflux disease without esophagitis: Secondary | ICD-10-CM | POA: Diagnosis not present

## 2021-07-02 DIAGNOSIS — E785 Hyperlipidemia, unspecified: Secondary | ICD-10-CM | POA: Diagnosis not present

## 2021-07-02 DIAGNOSIS — Z79899 Other long term (current) drug therapy: Secondary | ICD-10-CM | POA: Insufficient documentation

## 2021-07-02 DIAGNOSIS — Z7982 Long term (current) use of aspirin: Secondary | ICD-10-CM | POA: Diagnosis not present

## 2021-07-02 DIAGNOSIS — E119 Type 2 diabetes mellitus without complications: Secondary | ICD-10-CM | POA: Insufficient documentation

## 2021-07-02 DIAGNOSIS — Z9989 Dependence on other enabling machines and devices: Secondary | ICD-10-CM | POA: Diagnosis not present

## 2021-07-02 DIAGNOSIS — I350 Nonrheumatic aortic (valve) stenosis: Secondary | ICD-10-CM | POA: Insufficient documentation

## 2021-07-02 DIAGNOSIS — Z87891 Personal history of nicotine dependence: Secondary | ICD-10-CM | POA: Diagnosis not present

## 2021-07-02 DIAGNOSIS — G4733 Obstructive sleep apnea (adult) (pediatric): Secondary | ICD-10-CM | POA: Insufficient documentation

## 2021-07-02 DIAGNOSIS — I10 Essential (primary) hypertension: Secondary | ICD-10-CM | POA: Insufficient documentation

## 2021-07-02 DIAGNOSIS — Z881 Allergy status to other antibiotic agents status: Secondary | ICD-10-CM | POA: Diagnosis not present

## 2021-07-02 DIAGNOSIS — E559 Vitamin D deficiency, unspecified: Secondary | ICD-10-CM | POA: Diagnosis not present

## 2021-07-02 DIAGNOSIS — Z882 Allergy status to sulfonamides status: Secondary | ICD-10-CM | POA: Diagnosis not present

## 2021-07-02 DIAGNOSIS — D696 Thrombocytopenia, unspecified: Secondary | ICD-10-CM | POA: Diagnosis not present

## 2021-07-02 HISTORY — PX: DRUG INDUCED ENDOSCOPY: SHX6808

## 2021-07-02 LAB — GLUCOSE, CAPILLARY
Glucose-Capillary: 140 mg/dL — ABNORMAL HIGH (ref 70–99)
Glucose-Capillary: 155 mg/dL — ABNORMAL HIGH (ref 70–99)

## 2021-07-02 LAB — BASIC METABOLIC PANEL
Anion gap: 6 (ref 5–15)
BUN: 11 mg/dL (ref 8–23)
CO2: 26 mmol/L (ref 22–32)
Calcium: 9 mg/dL (ref 8.9–10.3)
Chloride: 107 mmol/L (ref 98–111)
Creatinine, Ser: 0.94 mg/dL (ref 0.61–1.24)
GFR, Estimated: >60 mL/min (ref >60)
Glucose, Bld: 131 mg/dL — ABNORMAL HIGH (ref 70–99)
Potassium: 3.4 mmol/L — ABNORMAL LOW (ref 3.5–5.1)
Sodium: 139 mmol/L (ref 135–145)

## 2021-07-02 SURGERY — DRUG INDUCED SLEEP ENDOSCOPY
Anesthesia: Monitor Anesthesia Care | Site: Nose

## 2021-07-02 MED ORDER — PROPOFOL 10 MG/ML IV BOLUS
INTRAVENOUS | Status: AC
  Filled 2021-07-02: qty 20

## 2021-07-02 MED ORDER — PHENYLEPHRINE HCL-NACL 20-0.9 MG/250ML-% IV SOLN
INTRAVENOUS | Status: DC | PRN
  Administered 2021-07-02: 20 ug/min via INTRAVENOUS

## 2021-07-02 MED ORDER — ORAL CARE MOUTH RINSE: 15.0000 mL | Freq: Once | OROMUCOSAL | Status: AC

## 2021-07-02 MED ORDER — LACTATED RINGERS IV SOLN: INTRAVENOUS | Status: DC

## 2021-07-02 MED ORDER — FENTANYL CITRATE (PF) 250 MCG/5ML IJ SOLN
INTRAMUSCULAR | Status: AC
  Filled 2021-07-02: qty 5

## 2021-07-02 MED ORDER — OXYMETAZOLINE HCL 0.05 % NA SOLN
NASAL | Status: AC
  Filled 2021-07-02: qty 30

## 2021-07-02 MED ORDER — OXYMETAZOLINE HCL 0.05 % NA SOLN
NASAL | Status: DC | PRN
  Administered 2021-07-02: 1

## 2021-07-02 MED ORDER — CHLORHEXIDINE GLUCONATE 0.12 % MT SOLN
15.0000 mL | Freq: Once | OROMUCOSAL | Status: AC
  Administered 2021-07-02: 15 mL via OROMUCOSAL
  Filled 2021-07-02: qty 15

## 2021-07-02 MED ORDER — MIDAZOLAM HCL 2 MG/2ML IJ SOLN
INTRAMUSCULAR | Status: AC
  Filled 2021-07-02: qty 2

## 2021-07-02 MED ORDER — PROPOFOL 500 MG/50ML IV EMUL
INTRAVENOUS | Status: DC | PRN
  Administered 2021-07-02: 100 ug/kg/min via INTRAVENOUS

## 2021-07-02 MED ORDER — LIDOCAINE 2% (20 MG/ML) 5 ML SYRINGE
INTRAMUSCULAR | Status: DC | PRN
  Administered 2021-07-02: 40 mg via INTRAVENOUS
  Administered 2021-07-02: 60 mg via INTRAVENOUS

## 2021-07-02 MED ORDER — PROPOFOL 10 MG/ML IV BOLUS
INTRAVENOUS | Status: DC | PRN
  Administered 2021-07-02 (×3): 10 mg via INTRAVENOUS

## 2021-07-02 SURGICAL SUPPLY — 12 items
BAG COUNTER SPONGE SURGICOUNT (BAG) IMPLANT
CANISTER SUCT 1200ML W/VALVE (MISCELLANEOUS) ×2 IMPLANT
GLOVE SURG ENC MOIS LTX SZ7.5 (GLOVE) ×2 IMPLANT
KIT BASIN OR (CUSTOM PROCEDURE TRAY) ×2 IMPLANT
NEEDLE PRECISIONGLIDE 27X1.5 (NEEDLE) IMPLANT
PATTIES SURGICAL .5 X3 (DISPOSABLE) ×2 IMPLANT
SHEET MEDIUM DRAPE 40X70 STRL (DRAPES) IMPLANT
SOL ANTI FOG 6CC (MISCELLANEOUS) ×1 IMPLANT
SOLUTION ANTI FOG 6CC (MISCELLANEOUS) ×1
SYR CONTROL 10ML LL (SYRINGE) IMPLANT
TOWEL GREEN STERILE FF (TOWEL DISPOSABLE) ×2 IMPLANT
TUBE CONNECTING 20X1/4 (TUBING) IMPLANT

## 2021-07-02 NOTE — Transfer of Care (Signed)
Immediate Anesthesia Transfer of Care Note  Patient: Tony Cantu  Procedure(s) Performed: DRUG INDUCED SLEEP ENDOSCOPY (Nose)  Patient Location: PACU  Anesthesia Type:MAC  Level of Consciousness: awake and patient cooperative  Airway & Oxygen Therapy: Patient Spontanous Breathing and Patient connected to nasal cannula oxygen  Post-op Assessment: Report given to RN and Post -op Vital signs reviewed and stable  Post vital signs: Reviewed and stable  Last Vitals:  Vitals Value Taken Time  BP 122/29 07/02/21 0801  Temp    Pulse 30 07/02/21 0804  Resp 16 07/02/21 0804  SpO2 96 % 07/02/21 0804  Vitals shown include unvalidated device data.  Last Pain:  Vitals:   07/02/21 0629  TempSrc:   PainSc: 0-No pain      Patients Stated Pain Goal: 3 (46/28/63 [id number])  Complications: No notable events documented.

## 2021-07-02 NOTE — Brief Op Note (Signed)
07/02/2021  7:52 AM  PATIENT:  [name]  70 y.o. male  PRE-OPERATIVE DIAGNOSIS:  obstructive sleep apnea  POST-OPERATIVE DIAGNOSIS:  obstructive sleep apnea  PROCEDURE:  Procedure(s): DRUG INDUCED SLEEP ENDOSCOPY (N/A)  SURGEON:  Surgeon(s) and Role:    Melida Quitter, MD - Primary  PHYSICIAN ASSISTANT:   ASSISTANTS: none   ANESTHESIA:   IV sedation  EBL:  None   BLOOD ADMINISTERED:none  DRAINS: none   LOCAL MEDICATIONS USED:  NONE  SPECIMEN:  No Specimen  DISPOSITION OF SPECIMEN:  N/A  COUNTS:  YES  TOURNIQUET:  * No tourniquets in log *  DICTATION: .Note written in EPIC  PLAN OF CARE: Discharge to home after PACU  PATIENT DISPOSITION:  PACU - hemodynamically stable.   Delay start of Pharmacological VTE agent (>24hrs) due to surgical blood loss or risk of bleeding: no

## 2021-07-02 NOTE — Anesthesia Postprocedure Evaluation (Signed)
Anesthesia Post Note  Patient: Tony Cantu  Procedure(s) Performed: DRUG INDUCED SLEEP ENDOSCOPY (Nose)     Patient location during evaluation: PACU Anesthesia Type: MAC Level of consciousness: awake and alert Pain management: pain level controlled Vital Signs Assessment: post-procedure vital signs reviewed and stable Respiratory status: spontaneous breathing, nonlabored ventilation and respiratory function stable Cardiovascular status: blood pressure returned to baseline and stable Postop Assessment: no apparent nausea or vomiting Anesthetic complications: no   No notable events documented.  Last Vitals:  Vitals:   07/02/21 0800 07/02/21 0815  BP:  112/66  Pulse:  (!) 43  Resp:  (!) 22  Temp: 36.7 C   SpO2:  97%    Last Pain:  Vitals:   07/02/21 0629  TempSrc:   PainSc: 0-No pain                 Lidia Collum

## 2021-07-02 NOTE — Anesthesia Procedure Notes (Signed)
Procedure Name: MAC Date/Time: 07/02/2021 7:35 AM Performed by: Renato Shin, CRNA Pre-anesthesia Checklist: Patient identified, Emergency Drugs available, Suction available and Patient being monitored Patient Re-evaluated:Patient Re-evaluated prior to induction Oxygen Delivery Method: Nasal cannula Preoxygenation: Pre-oxygenation with 100% oxygen Induction Type: IV induction Placement Confirmation: positive ETCO2 and breath sounds checked- equal and bilateral Dental Injury: Teeth and Oropharynx as per pre-operative assessment

## 2021-07-02 NOTE — H&P (Signed)
Tony Cantu is an 70 y.o. male.   Chief Complaint: Sleep apnea HPI: 70 year old male with obstructive sleep apnea who has had difficulty tolerating CPAP.  BMI is 34.70.  Past Medical History:  Diagnosis Date   Arthritis    Asthma    BPH (benign prostatic hypertrophy)    Cirrhosis (HCC)    Colon polyps    Diabetes mellitus without complication ([place])    TYPE 1    Diverticulosis    Gastric ulcer    Gastric varices    right   Gastritis    GERD (gastroesophageal reflux disease)    Headache    HX   Hypertension    PONV (postoperative nausea and vomiting)    Renal cyst, right    Seizures (HCC)    HYPOGLYCEMIC LAST 1 AND 1/2 YRS AGO   Sleep apnea    uses CPAP nightly   Testicle trouble    one testicle BORN WITH   Tubular adenoma of colon     Past Surgical History:  Procedure Laterality Date   BACK SURGERY     5  LOWER    CARPAL TUNNEL RELEASE Right 10/13/2015   Procedure: RIGHT CARPAL TUNNEL RELEASE;  Surgeon: Daryll Brod, MD;  Location: [facility];  Service: Orthopedics;  Laterality: Right;   CARPAL TUNNEL RELEASE Left 07/05/2016   Procedure: LEFT CARPAL TUNNEL RELEASE;  Surgeon: Daryll Brod, MD;  Location: [facility];  Service: Orthopedics;  Laterality: Left;   HERNIA REPAIR     INGUINAL HERNIA REPAIR  2003   right    KNEE ARTHROSCOPY Right 03/03/2016   LUMBAR DISC SURGERY  8/96   Dr. Coralyn Mark, discectomy   LUMBAR LAMINECTOMY/DECOMPRESSION MICRODISCECTOMY Right 10/06/2016   Procedure: RIGHT LUMBAR THREE - LUMBAR FOUR  LAMINECTOMY, FORAMINOTOMY AND MICRODISCECTOMY;  Surgeon: Jovita Gamma, MD;  Location: [facility];  Service: Neurosurgery;  Laterality: Right;   SHOULDER SURGERY  11/28/05   left partial   SHOULDER SURGERY  07/14/2006   RIGHT   TONSILLECTOMY  AGE 62 OR 4   TOTAL SHOULDER ARTHROPLASTY Right 11/01/2018   Procedure: RIGHT SHOULDER REVISION TO REVERSE TOTAL SHOULDER;  Surgeon: Tania Ade, MD;  Location: [facility];  Service:  Orthopedics;  Laterality: Right;  CHOICE ANESTHESIA WITH INTERSCALENE BLOCK EXPAREL, NEEDS RNFA    Family History  Problem Relation Age of Onset   Heart failure Mother    Heart attack Mother        in her 48's   Alzheimer's disease Mother    Diabetes Mother    Asthma Father    Suicidality Father        in his 34's   Allergic rhinitis Sister    Heart failure Maternal Grandmother    Rheum arthritis Maternal Grandmother    Breast cancer Maternal Grandmother    Heart attack Maternal Grandmother        in her 70's   Colon cancer Neg Hx    Esophageal cancer Neg Hx    Pancreatic cancer Neg Hx    Liver disease Neg Hx    Social History:  reports that he quit smoking about 17 years ago. His smoking use included cigarettes and pipe. He has never used smokeless tobacco. He reports that he does not drink alcohol and does not use drugs.  Allergies:  Allergies  Allergen Reactions   Erythromycin Diarrhea   Sulfonamide Derivatives Diarrhea    Medications Prior to Admission  Medication Sig Dispense Refill  aspirin 81 MG EC tablet Take 81 mg by mouth daily.     atorvastatin (LIPITOR) 40 MG tablet TAKE 1 TABLET BY MOUTH  DAILY 90 tablet 0   b complex vitamins tablet Take 1 tablet by mouth daily.     Cholecalciferol (VITAMIN D3) 2000 UNITS capsule Take 2,000 Units by mouth daily.     Coenzyme Q10 (COQ10) 200 MG CAPS Take 200 mg by mouth daily.     diltiazem (CARDIZEM) 30 MG tablet Take 1 tablet (30 mg total) by mouth 2 (two) times daily. 180 tablet 3   furosemide (LASIX) 20 MG tablet TAKE 1 AND 1/2 TABLETS BY  MOUTH DAILY 135 tablet 1   Glucosamine-Chondroit-Vit C-Mn (GLUCOSAMINE 1500 COMPLEX) CAPS Take 2 capsules by mouth daily.     glucose blood (ONETOUCH ULTRA) test strip Use as needed E11.9 200 each 12   insulin lispro (HUMALOG) 100 UNIT/ML injection INJECT SUBCUTANEOUSLY 20 TO 50 UNITS 3 TIMES DAILY  BEFORE MEALS PER SLIDING  SCALE (Patient taking differently: Inject 20-32 Units into the  skin See admin instructions. Every 2 hours per sliding scale) 140 mL 0   magnesium oxide (MAG-OX) 400 MG tablet Take 400 mg by mouth daily.     montelukast (SINGULAIR) 10 MG tablet TAKE 1 TABLET BY MOUTH AT  BEDTIME 90 tablet 3   multivitamin-lutein (OCUVITE-LUTEIN) CAPS capsule Take 1 capsule by mouth daily.     pantoprazole (PROTONIX) 40 MG tablet Take 1 tablet (40 mg total) by mouth 2 (two) times daily before a meal. (Patient taking differently: Take 40 mg by mouth daily.) 60 tablet 0   Testosterone 30 MG/ACT SOLN APPLY ONE PUMP TOPICALLY  UNDER EACH AXILLA DAILY  (TOTAL OF 60MG DAILY ) 270 mL 1   TOUJEO MAX SOLOSTAR 300 UNIT/ML Solostar Pen INJECT 110 UNITS INTO THE  SKIN DAILY (Patient taking differently: Inject 80 Units into the skin every evening.) 36 mL 0   Turmeric Curcumin 500 MG CAPS Take 500 mg by mouth daily.     valsartan (DIOVAN) 160 MG tablet Take 80 mg by mouth daily.     albuterol (VENTOLIN HFA) 108 (90 Base) MCG/ACT inhaler Inhale 2 puffs into the lungs every 6 (six) hours as needed for wheezing or shortness of breath. (Patient not taking: Reported on 06/28/2021) 6.7 g 0   apixaban (ELIQUIS) 5 MG TABS tablet Take 1 tablet (5 mg total) by mouth 2 (two) times daily. 180 tablet 1   Blood Glucose Monitoring Suppl (CONTOUR NEXT ONE) KIT Test BS QID and as needed Dx E11.9 500 kit 3   Continuous Blood Gluc Receiver (FREESTYLE LIBRE READER) DEVI 1 applicator by Does not apply route as directed. 1 Device 1   Continuous Blood Gluc Sensor (FREESTYLE LIBRE SENSOR SYSTEM) MISC Check BS eight (8) times a day. Dx E10.9 3 each 3   Glucagon, rDNA, (GLUCAGON EMERGENCY) 1 MG KIT INJECT AS DIRECTED INTO  UPPER ARM, THIGH OR  BUTTOCKS AS NEEDED FOR  SEVERE HYPOGLYCEMIA. SEEK  MEDICAL ATTENTION AFTER USE 3 kit 0   Microlet Lancets MISC Test BS QID and as needed Dx E11.9 500 each 3    Results for orders placed or performed during the hospital encounter of 07/02/21 (from the past 48 hour(s))  Glucose,  capillary     Status: Abnormal   Collection Time: 07/02/21  5:35 AM  Result Value Ref Range   Glucose-Capillary 140 (H) 70 - 99 mg/dL    Comment: Glucose reference range applies only to  samples taken after fasting for at least 8 hours.  Basic metabolic panel per protocol     Status: Abnormal   Collection Time: 07/02/21  5:53 AM  Result Value Ref Range   Sodium 139 135 - 145 mmol/L   Potassium 3.4 (L) 3.5 - 5.1 mmol/L   Chloride 107 98 - 111 mmol/L   CO2 26 22 - 32 mmol/L   Glucose, Bld 131 (H) 70 - 99 mg/dL    Comment: Glucose reference range applies only to samples taken after fasting for at least 8 hours.   BUN 11 8 - 23 mg/dL   Creatinine, Ser 0.94 0.61 - 1.24 mg/dL   Calcium 9.0 8.9 - 10.3 mg/dL   GFR, Estimated >60 >60 mL/min    Comment: (NOTE) Calculated using the CKD-EPI Creatinine Equation (2021)    Anion gap 6 5 - 15    Comment: Performed at [facility] [address] [place], [place] [address]   No results found.  Review of Systems  All other systems reviewed and are negative.  Blood pressure (!) 111/50, pulse 71, temperature 98.3 F (36.8 C), temperature source Oral, resp. rate 17, height 5' 9"  (1.753 m), weight 106.6 kg, SpO2 95 %. Physical Exam Constitutional:      Appearance: Normal appearance. He is normal weight.  HENT:     Head: Normocephalic and atraumatic.     Right Ear: External ear normal.     Left Ear: External ear normal.     Nose: Nose normal.     Mouth/Throat:     Mouth: Mucous membranes are moist.     Pharynx: Oropharynx is clear.  Eyes:     Extraocular Movements: Extraocular movements intact.     Conjunctiva/sclera: Conjunctivae normal.     Pupils: Pupils are equal, round, and reactive to light.  Cardiovascular:     Rate and Rhythm: Normal rate.  Pulmonary:     Effort: Pulmonary effort is normal.  Musculoskeletal:     Cervical back: Normal range of motion.  Skin:    General: Skin is warm and dry.  Neurological:     General: No  focal deficit present.     Mental Status: He is alert and oriented to person, place, and time.  Psychiatric:        Mood and Affect: Mood normal.        Behavior: Behavior normal.        Thought Content: Thought content normal.        Judgment: Judgment normal.     Assessment/Plan Obstructive sleep apnea  To OR for sleep endoscopy.  Melida Quitter, MD 07/02/2021, 7:21 AM

## 2021-07-02 NOTE — Op Note (Signed)
Preop diagnosis: Obstructive sleep apnea Postop diagnosis: same Procedure: Drug-induced sleep endoscopy Surgeon: Redmond Baseman Anesth: IV sedation Compl: None Findings: There is 50% anterior-posterior and 50% side Klingel collapse at the velum making him a candidate for hypoglossal nerve stimulator placement.  There was also anterior-posterior collapse at the tongue base. Description:  After discussing risks, benefits, and alternatives, the patient was brought to the operative suite and placed on the operative table in the supine position.  Anesthesia was induced and the patient was given light sedation to simulate natural sleep. When the proper level was reached, an Afrin-soaked pledget was placed in the right nasal passage for a couple of minutes and then removed.  The fiberoptic laryngoscope was then passed to view the pharynx and larynx.  Findings are noted above and the exam was recorded.  After completion, the scope was removed and the patient was returned to anesthesia for wakeup and was moved to the recovery room in stable condition.

## 2021-07-03 ENCOUNTER — Encounter (HOSPITAL_COMMUNITY): Payer: Self-pay | Admitting: Otolaryngology

## 2021-07-05 ENCOUNTER — Other Ambulatory Visit: Payer: Medicare Other

## 2021-07-05 ENCOUNTER — Other Ambulatory Visit: Payer: Self-pay

## 2021-07-05 DIAGNOSIS — N200 Calculus of kidney: Secondary | ICD-10-CM | POA: Diagnosis not present

## 2021-07-05 LAB — URINALYSIS, ROUTINE W REFLEX MICROSCOPIC
Bilirubin, UA: NEGATIVE
Glucose, UA: NEGATIVE
Ketones, UA: NEGATIVE
Leukocytes,UA: NEGATIVE
Nitrite, UA: NEGATIVE
Protein,UA: NEGATIVE
Specific Gravity, UA: 1.015 (ref 1.005–1.030)
Urobilinogen, Ur: 0.2 mg/dL (ref 0.2–1.0)
pH, UA: 7 (ref 5.0–7.5)

## 2021-07-05 LAB — MICROSCOPIC EXAMINATION
Bacteria, UA: NONE SEEN
Renal Epithel, UA: NONE SEEN /hpf
WBC, UA: NONE SEEN /hpf (ref 0–5)

## 2021-07-05 LAB — BASIC METABOLIC PANEL
BUN/Creatinine Ratio: 14 (ref 10–24)
BUN: 13 mg/dL (ref 8–27)
CO2: 21 mmol/L (ref 20–29)
Calcium: 9.5 mg/dL (ref 8.6–10.2)
Chloride: 103 mmol/L (ref 96–106)
Creatinine, Ser: 0.96 mg/dL (ref 0.76–1.27)
Glucose: 70 mg/dL (ref 70–99)
Potassium: 4.2 mmol/L (ref 3.5–5.2)
Sodium: 141 mmol/L (ref 134–144)
eGFR: 85 mL/min/{1.73_m2} (ref >59)

## 2021-07-05 LAB — CBC
Hematocrit: 47.8 % (ref 37.5–51.0)
Hemoglobin: 16.6 g/dL (ref 13.0–17.7)
MCH: 34.9 pg — ABNORMAL HIGH (ref 26.6–33.0)
MCHC: 34.7 g/dL (ref 31.5–35.7)
MCV: 100 fL — ABNORMAL HIGH (ref 79–97)
Platelets: 112 10*3/uL — ABNORMAL LOW (ref 150–450)
RBC: 4.76 x10E6/uL (ref 4.14–5.80)
RDW: 13.1 % (ref 11.6–15.4)
WBC: 5.9 10*3/uL (ref 3.4–10.8)

## 2021-07-06 ENCOUNTER — Ambulatory Visit (INDEPENDENT_AMBULATORY_CARE_PROVIDER_SITE_OTHER): Payer: BC Managed Care – PPO | Admitting: Family Medicine

## 2021-07-06 VITALS — BP 105/55 | HR 63

## 2021-07-06 DIAGNOSIS — N182 Chronic kidney disease, stage 2 (mild): Secondary | ICD-10-CM | POA: Diagnosis not present

## 2021-07-06 DIAGNOSIS — E1159 Type 2 diabetes mellitus with other circulatory complications: Secondary | ICD-10-CM

## 2021-07-06 DIAGNOSIS — I152 Hypertension secondary to endocrine disorders: Secondary | ICD-10-CM | POA: Diagnosis not present

## 2021-07-06 DIAGNOSIS — E1122 Type 2 diabetes mellitus with diabetic chronic kidney disease: Secondary | ICD-10-CM

## 2021-07-06 DIAGNOSIS — Z23 Encounter for immunization: Secondary | ICD-10-CM

## 2021-07-06 DIAGNOSIS — Z87442 Personal history of urinary calculi: Secondary | ICD-10-CM

## 2021-07-06 DIAGNOSIS — Z794 Long term (current) use of insulin: Secondary | ICD-10-CM | POA: Diagnosis not present

## 2021-07-06 NOTE — Progress Notes (Addendum)
Subjective: TD:VVOHY stone  PCP: [name], DO WVP:[id number] Tony Cantu is a 70 y.o. male presenting to clinic today for:  1. Kidney stone Reports total resolution of flank and abdominal symptoms.  Has discontinued his calcium supplement.   2. Type 2 Diabetes with hypertension, hyperlipidemia:  Compliant with his Toujeo, Lipitor and Diovan.  He has very labile blood sugars and has in fact had many hypoglycemic readings warranting CGM.  Last eye exam: Needs Last foot exam: Needs Last A1c:  Lab Results  Component Value Date   HGBA1C 5.0 04/13/2021   Nephropathy screen indicated?:  On ARB Last flu, zoster and/or pneumovax:  Immunization History  Administered Date(s) Administered   Fluad Quad(high Dose 65+) 05/17/2019, 05/19/2020   Influenza Split 06/05/2013   Influenza Whole 06/15/2010, 06/16/2011, 05/29/2012   Influenza, High Dose Seasonal PF 06/25/2015, 06/12/2017   Influenza,inj,Quad PF,6+ Mos 06/17/2014, 06/07/2016   Influenza-Unspecified 05/22/2018   Moderna SARS-COV2 Booster Vaccination 08/26/2020   Moderna Sars-Covid-2 Vaccination 11/07/2019, 12/05/2019   Pneumococcal Conjugate-13 06/05/2013   Pneumococcal Polysaccharide-23 06/03/2011, 09/16/2019   Tdap 08/30/2007, 12/27/2017    ROS: No chest pain, shortness of breath.  No foot ulcers   ROS: Per HPI  Allergies  Allergen Reactions   Erythromycin Diarrhea   Sulfonamide Derivatives Diarrhea   Past Medical History:  Diagnosis Date   Arthritis    Asthma    BPH (benign prostatic hypertrophy)    Cirrhosis (HCC)    Colon polyps    Diabetes mellitus without complication (HCC)    TYPE 1    Diverticulosis    Gastric ulcer    Gastric varices    right   Gastritis    GERD (gastroesophageal reflux disease)    Headache    HX   Hypertension    PONV (postoperative nausea and vomiting)    Renal cyst, right    Seizures (HCC)    HYPOGLYCEMIC LAST 1 AND 1/2 YRS AGO   Sleep apnea    uses CPAP nightly    Testicle trouble    one testicle BORN WITH   Tubular adenoma of colon     Current Outpatient Medications:    albuterol (VENTOLIN HFA) 108 (90 Base) MCG/ACT inhaler, Inhale 2 puffs into the lungs every 6 (six) hours as needed for wheezing or shortness of breath. (Patient not taking: Reported on 06/28/2021), Disp: 6.7 g, Rfl: 0   apixaban (ELIQUIS) 5 MG TABS tablet, Take 1 tablet (5 mg total) by mouth 2 (two) times daily., Disp: 180 tablet, Rfl: 1   aspirin 81 MG EC tablet, Take 81 mg by mouth daily., Disp: , Rfl:    atorvastatin (LIPITOR) 40 MG tablet, TAKE 1 TABLET BY MOUTH  DAILY, Disp: 90 tablet, Rfl: 0   b complex vitamins tablet, Take 1 tablet by mouth daily., Disp: , Rfl:    Blood Glucose Monitoring Suppl (CONTOUR NEXT ONE) KIT, Test BS QID and as needed Dx E11.9, Disp: 500 kit, Rfl: 3   Cholecalciferol (VITAMIN D3) 2000 UNITS capsule, Take 2,000 Units by mouth daily., Disp: , Rfl:    Coenzyme Q10 (COQ10) 200 MG CAPS, Take 200 mg by mouth daily., Disp: , Rfl:    Continuous Blood Gluc Receiver (FREESTYLE LIBRE READER) DEVI, 1 applicator by Does not apply route as directed., Disp: 1 Device, Rfl: 1   Continuous Blood Gluc Sensor ([place]) MISC, Check BS eight (8) times a day. Dx E10.9, Disp: 3 each, Rfl: 3   diltiazem (CARDIZEM) 30 MG  tablet, Take 1 tablet (30 mg total) by mouth 2 (two) times daily., Disp: 180 tablet, Rfl: 3   furosemide (LASIX) 20 MG tablet, TAKE 1 AND 1/2 TABLETS BY  MOUTH DAILY, Disp: 135 tablet, Rfl: 1   Glucagon, rDNA, (GLUCAGON EMERGENCY) 1 MG KIT, INJECT AS DIRECTED INTO  UPPER ARM, THIGH OR  BUTTOCKS AS NEEDED FOR  SEVERE HYPOGLYCEMIA. SEEK  MEDICAL ATTENTION AFTER USE, Disp: 3 kit, Rfl: 0   Glucosamine-Chondroit-Vit C-Mn (GLUCOSAMINE 1500 COMPLEX) CAPS, Take 2 capsules by mouth daily., Disp: , Rfl:    glucose blood (ONETOUCH ULTRA) test strip, Use as needed E11.9, Disp: 200 each, Rfl: 12   insulin lispro (HUMALOG) 100 UNIT/ML injection, INJECT  SUBCUTANEOUSLY 20 TO 50 UNITS 3 TIMES DAILY  BEFORE MEALS PER SLIDING  SCALE (Patient taking differently: Inject 20-32 Units into the skin See admin instructions. Every 2 hours per sliding scale), Disp: 140 mL, Rfl: 0   magnesium oxide (MAG-OX) 400 MG tablet, Take 400 mg by mouth daily., Disp: , Rfl:    Microlet Lancets MISC, Test BS QID and as needed Dx E11.9, Disp: 500 each, Rfl: 3   montelukast (SINGULAIR) 10 MG tablet, TAKE 1 TABLET BY MOUTH AT  BEDTIME, Disp: 90 tablet, Rfl: 3   multivitamin-lutein (OCUVITE-LUTEIN) CAPS capsule, Take 1 capsule by mouth daily., Disp: , Rfl:    pantoprazole (PROTONIX) 40 MG tablet, Take 1 tablet (40 mg total) by mouth 2 (two) times daily before a meal. (Patient taking differently: Take 40 mg by mouth daily.), Disp: 60 tablet, Rfl: 0   Testosterone 30 MG/ACT SOLN, APPLY ONE PUMP TOPICALLY  UNDER EACH AXILLA DAILY  (TOTAL OF 60MG DAILY ), Disp: 270 mL, Rfl: 1   TOUJEO MAX SOLOSTAR 300 UNIT/ML Solostar Pen, INJECT 110 UNITS INTO THE  SKIN DAILY (Patient taking differently: Inject 80 Units into the skin every evening.), Disp: 36 mL, Rfl: 0   Turmeric Curcumin 500 MG CAPS, Take 500 mg by mouth daily., Disp: , Rfl:    valsartan (DIOVAN) 160 MG tablet, Take 80 mg by mouth daily., Disp: , Rfl:  Social History   Socioeconomic History   Marital status: Married    Spouse name: Not on file   Number of children: Not on file   Years of education: Not on file   Highest education level: Not on file  Occupational History   Occupation: retired   Tobacco Use   Smoking status: Former    Years: 34.00    Types: Cigarettes, Pipe    Quit date: 2005    Years since quitting: 17.8   Smokeless tobacco: Never   Tobacco comments:    quit 2005 smoked cigarettes for 5 yrs prior to pipe use  Vaping Use   Vaping Use: Never used  Substance and Sexual Activity   Alcohol use: No    Comment: once a year   Drug use: No   Sexual activity: Yes  Other Topics Concern   Not on file   Social History Narrative   ** Merged History Encounter **       Social Determinants of Health   Financial Resource Strain: Not on file  Food Insecurity: Not on file  Transportation Needs: Not on file  Physical Activity: Not on file  Stress: Not on file  Social Connections: Not on file  Intimate Partner Violence: Not on file   Family History  Problem Relation Age of Onset   Heart failure Mother    Heart attack Mother  in her 61's   Alzheimer's disease Mother    Diabetes Mother    Asthma Father    Suicidality Father        in his 36's   Allergic rhinitis Sister    Heart failure Maternal Grandmother    Rheum arthritis Maternal Grandmother    Breast cancer Maternal Grandmother    Heart attack Maternal Grandmother        in her 29's   Colon cancer Neg Hx    Esophageal cancer Neg Hx    Pancreatic cancer Neg Hx    Liver disease Neg Hx     Objective: Office vital signs reviewed. BP (!) 105/55   Pulse 63   SpO2 95%   Physical Examination:  General: Awake, alert, well nourished, No acute distress HEENT: Normal, sclera white, MMM Cardio: regular rate and rhythm, Z6X0 heard, systolic murmurs appreciated Pulm: clear to auscultation bilaterally, no wheezes, rhonchi or rales; normal work of breathing on room air Extremities: warm, well perfused, No edema, cyanosis or clubbing; +2 pulses bilaterally MSK: normal gait and station Skin: dry; intact; no rashes or lesions Neuro: see DM foot Diabetic Foot Exam - Simple   Simple Foot Form Diabetic Foot exam was performed with the following findings: Yes 07/06/2021  8:46 AM  Visual Inspection No deformities, no ulcerations, no other skin breakdown bilaterally: Yes Sensation Testing Intact to touch and monofilament testing bilaterally: Yes Pulse Check Posterior Tibialis and Dorsalis pulse intact bilaterally: Yes Comments      Assessment/ Plan: 70 y.o. male   History of nephrolithiasis  Controlled type 2 diabetes  mellitus with stage 2 chronic kidney disease, with long-term current use of insulin (HCC) - Plan: Bayer DCA Hb A1c Waived  Hypertension associated with type 2 diabetes mellitus ([place])  Need for immunization against influenza - Plan: Flu Vaccine QUAD High Dose(Fluad)  A1c deferred today.  We reviewed his other labs.  Renal function stable.  He unfortunately does suffer from hypoglycemia and for this reason utilizes CGM.  We are currently working on getting him on a less intensive insulin regimen.  He had trace blood in the dip but RBCs were absent on urine microscopy.  Sounds like he is totally asymptomatic from a renal stone standpoint.  Agree with discontinuation of calcium supplementation in the absence of known osteoporosis or calcium deficit.  Influenza vaccination administered  Orders Placed This Encounter  Procedures   Bayer DCA Hb A1c Waived   No orders of the defined types were placed in this encounter.    [name], DO [place] [phone]

## 2021-07-07 ENCOUNTER — Encounter: Payer: Self-pay | Admitting: Internal Medicine

## 2021-07-07 ENCOUNTER — Ambulatory Visit (AMBULATORY_SURGERY_CENTER): Payer: BC Managed Care – PPO | Admitting: Internal Medicine

## 2021-07-07 ENCOUNTER — Encounter: Payer: Self-pay | Admitting: Family Medicine

## 2021-07-07 VITALS — BP 105/58 | HR 60 | Temp 98.0°F | Resp 17 | Ht 70.0 in | Wt 231.0 lb

## 2021-07-07 DIAGNOSIS — K7460 Unspecified cirrhosis of liver: Secondary | ICD-10-CM

## 2021-07-07 DIAGNOSIS — K31819 Angiodysplasia of stomach and duodenum without bleeding: Secondary | ICD-10-CM | POA: Diagnosis not present

## 2021-07-07 DIAGNOSIS — K319 Disease of stomach and duodenum, unspecified: Secondary | ICD-10-CM

## 2021-07-07 DIAGNOSIS — K2970 Gastritis, unspecified, without bleeding: Secondary | ICD-10-CM

## 2021-07-07 DIAGNOSIS — K766 Portal hypertension: Secondary | ICD-10-CM

## 2021-07-07 DIAGNOSIS — R1013 Epigastric pain: Secondary | ICD-10-CM | POA: Diagnosis not present

## 2021-07-07 MED ORDER — SODIUM CHLORIDE 0.9 % IV SOLN: 500.0000 mL | Freq: Once | INTRAVENOUS | Status: DC

## 2021-07-07 MED ORDER — FLUCONAZOLE 100 MG PO TABS: ORAL_TABLET | ORAL | 0 refills | Status: AC

## 2021-07-07 MED ORDER — PANTOPRAZOLE SODIUM 40 MG PO TBEC: 40.0000 mg | DELAYED_RELEASE_TABLET | Freq: Two times a day (BID) | ORAL | 3 refills | Status: DC

## 2021-07-07 NOTE — Op Note (Signed)
[place] Patient Name: Tony Cantu Procedure Date: 07/07/2021 10:22 AM MRN: [id number] Endoscopist: Jerene Bears , MD Age: 70 Referring MD:  Date of Birth: 1951-02-19 Gender: Male Account #: [id number] Procedure:                Upper GI endoscopy Indications:              Cirrhosis rule out esophageal varices                            (intraabdominal varices seen by CT scan), none at                            EGD in Aug 2020 Medicines:                Monitored Anesthesia Care Procedure:                Pre-Anesthesia Assessment:                           - Prior to the procedure, a History and Physical                            was performed, and patient medications and                            allergies were reviewed. The patient's tolerance of                            previous anesthesia was also reviewed. The risks                            and benefits of the procedure and the sedation                            options and risks were discussed with the patient.                            All questions were answered, and informed consent                            was obtained. Prior Anticoagulants: The patient has                            taken no previous anticoagulant or antiplatelet                            agents. ASA Grade Assessment: III - A patient with                            severe systemic disease. After reviewing the risks                            and benefits, the patient was deemed in  satisfactory condition to undergo the procedure.                           After obtaining informed consent, the endoscope was                            passed under direct vision. Throughout the                            procedure, the patient's blood pressure, pulse, and                            oxygen saturations were monitored continuously. The                            GIF [id number] #[id number] was introduced through the                             mouth, and advanced to the second part of duodenum.                            The upper GI endoscopy was accomplished without                            difficulty. The patient tolerated the procedure                            well. Scope In: Scope Out: Findings:                 Patchy, yellow plaques were found in the middle                            third of the esophagus and in the lower third of                            the esophagus.                           There is no endoscopic evidence of varices in the                            mid esophagus and in the distal esophagus.                           Moderate portal hypertensive gastropathy was found                            in the cardia, in the gastric fundus and in the                            gastric body.                           Moderate inflammation characterized by congestion                            (  edema), erythema and granularity was found in the                            gastric antrum and in the prepyloric region of the                            stomach. Previous ulceration in this location has                            healed. Biopsies were taken with a cold forceps for                            histology.                           The examined duodenum was normal. Complications:            No immediate complications. Estimated Blood Loss:     Estimated blood loss was minimal. Impression:               - Esophageal plaques were found, consistent with                            candidiasis.                           - Portal hypertensive gastropathy.                           - Gastritis. Biopsied.                           - Normal examined duodenum.                           - No evidence of esophageal or gastric varices. Recommendation:           - Patient has a contact number available for                            emergencies. The signs and symptoms of potential                             delayed complications were discussed with the                            patient. Return to normal activities tomorrow.                            Written discharge instructions were provided to the                            patient.                           - Resume previous diet.                           -  Continue present medications.                           - Fluconazole 200 mg once daily, 100 mg x 13 days.                           - Await pathology results.                           - From GI perspective okay to begin Eliquis, but                            with close attention to any black/tarry stools and                            to blood counts.                           - Repeat upper endoscopy in 2 years for screening                            purposes. Jerene Bears, MD 07/07/2021 10:55:36 AM This report has been signed electronically.

## 2021-07-07 NOTE — Progress Notes (Signed)
Sedate, gd SR, tolerated procedure well, VSS, report to RN 

## 2021-07-07 NOTE — Progress Notes (Signed)
Called to room to assist during endoscopic procedure.  Patient ID and intended procedure confirmed with present staff. Received instructions for my participation in the procedure from the performing physician.  

## 2021-07-07 NOTE — Progress Notes (Signed)
GASTROENTEROLOGY PROCEDURE H&P NOTE   Primary Care Physician: [name], DO    Reason for Procedure:  Screening for esophageal varices in the setting of cirrhosis  Plan:    EGD  Patient is appropriate for endoscopic procedure(s) in the ambulatory ([place]) setting.  The nature of the procedure, as well as the risks, benefits, and alternatives were carefully and thoroughly reviewed with the patient. Ample time for discussion and questions allowed. The patient understood, was satisfied, and agreed to proceed.     HPI: Tony Cantu is a 70 y.o. male who presents for EGD for screening.  Medical history as above.  Seen in late September by Nicoletta Ba, PA-C no significant changes in medical history since that time though he has been prescribed prescribed Eliquis.  He has not yet started this therapy.  He is on aspirin.  No recent chest pain or dyspnea.  No abdominal pain today.  He was treated for C. difficile several months ago with resolution of symptoms.  Past Medical History:  Diagnosis Date   Arthritis    Asthma    BPH (benign prostatic hypertrophy)    Cirrhosis (HCC)    Colon polyps    Diabetes mellitus without complication ([place])    TYPE 1    Diverticulosis    Gastric ulcer    Gastric varices    right   Gastritis    GERD (gastroesophageal reflux disease)    Headache    HX   Hypertension    PONV (postoperative nausea and vomiting)    Renal cyst, right    Seizures (HCC)    HYPOGLYCEMIC LAST 1 AND 1/2 YRS AGO   Sleep apnea    uses CPAP nightly   Testicle trouble    one testicle BORN WITH   Tubular adenoma of colon     Past Surgical History:  Procedure Laterality Date   BACK SURGERY     5  LOWER    CARPAL TUNNEL RELEASE Right 10/13/2015   Procedure: RIGHT CARPAL TUNNEL RELEASE;  Surgeon: Daryll Brod, MD;  Location: [facility];  Service: Orthopedics;  Laterality: Right;   CARPAL TUNNEL RELEASE Left 07/05/2016   Procedure: LEFT CARPAL  TUNNEL RELEASE;  Surgeon: Daryll Brod, MD;  Location: [facility];  Service: Orthopedics;  Laterality: Left;   DRUG INDUCED ENDOSCOPY N/A 07/02/2021   Procedure: DRUG INDUCED SLEEP ENDOSCOPY;  Surgeon: Melida Quitter, MD;  Location[facility];  Service: ENT;  Laterality: N/A;   [place] HERNIA REPAIR  2003   right    KNEE ARTHROSCOPY Right 03/03/2016   LUMBAR DISC SURGERY  8/96   Dr. Coralyn Mark, discectomy   LUMBAR LAMINECTOMY/DECOMPRESSION MICRODISCECTOMY Right 10/06/2016   Procedure: RIGHT LUMBAR THREE - LUMBAR FOUR  LAMINECTOMY, FORAMINOTOMY AND MICRODISCECTOMY;  Surgeon: Jovita Gamma, MD;  Location: [facility];  Service: Neurosurgery;  Laterality: Right;   SHOULDER SURGERY  11/28/05   left partial   SHOULDER SURGERY  07/14/2006   RIGHT   TONSILLECTOMY  AGE 81 OR 4   TOTAL SHOULDER ARTHROPLASTY Right 11/01/2018   Procedure: RIGHT SHOULDER REVISION TO REVERSE TOTAL SHOULDER;  Surgeon: Tania Ade, MD;  Location: [facility];  Service: Orthopedics;  Laterality: Right;  CHOICE ANESTHESIA WITH INTERSCALENE BLOCK EXPAREL, NEEDS RNFA    Prior to Admission medications   Medication Sig Start Date End Date Taking? Authorizing Provider  aspirin 81 MG EC tablet Take 81 mg by mouth daily.   Yes  [provider]  atorvastatin (LIPITOR) 40 MG tablet TAKE 1 TABLET BY MOUTH  DAILY 04/15/21  Yes Ronnie Doss M, DO  b complex vitamins tablet Take 1 tablet by mouth daily.   Yes [provider]  Cholecalciferol (VITAMIN D3) 2000 UNITS capsule Take 2,000 Units by mouth daily.   Yes [provider]  Coenzyme Q10 (COQ10) 200 MG CAPS Take 200 mg by mouth daily.   Yes [provider]  diltiazem (CARDIZEM) 30 MG tablet Take 1 tablet (30 mg total) by mouth 2 (two) times daily. 04/27/21  Yes Minus Breeding, MD  furosemide (LASIX) 20 MG tablet TAKE 1 AND 1/2 TABLETS BY  MOUTH DAILY 01/26/21  Yes Gottschalk, Ashly M, DO  insulin lispro (HUMALOG) 100 UNIT/ML  injection INJECT SUBCUTANEOUSLY 20 TO 50 UNITS 3 TIMES DAILY  BEFORE MEALS PER SLIDING  SCALE Patient taking differently: Inject 20-32 Units into the skin See admin instructions. Every 2 hours per sliding scale 06/09/21  Yes Gottschalk, Ashly M, DO  magnesium oxide (MAG-OX) 400 MG tablet Take 400 mg by mouth daily.   Yes [provider]  montelukast (SINGULAIR) 10 MG tablet TAKE 1 TABLET BY MOUTH AT  BEDTIME 10/19/20  Yes Gottschalk, Ashly M, DO  multivitamin-lutein (OCUVITE-LUTEIN) CAPS capsule Take 1 capsule by mouth daily.   Yes [provider]  pantoprazole (PROTONIX) 40 MG tablet Take 1 tablet (40 mg total) by mouth 2 (two) times daily before a meal. Patient taking differently: Take 40 mg by mouth daily. 04/22/21  Yes Esterwood, Amy S, PA-C  Testosterone 30 MG/ACT SOLN APPLY ONE PUMP TOPICALLY  UNDER EACH AXILLA DAILY  (TOTAL OF 60MG DAILY ) 09/18/20  Yes Gottschalk, Ashly M, DO  TOUJEO MAX SOLOSTAR 300 UNIT/ML Solostar Pen INJECT 110 UNITS INTO THE  SKIN DAILY Patient taking differently: Inject 80 Units into the skin every evening. 06/14/21  Yes Gottschalk, Leatrice Jewels M, DO  Turmeric Curcumin 500 MG CAPS Take 500 mg by mouth daily.   Yes [provider]  valsartan (DIOVAN) 160 MG tablet Take 80 mg by mouth daily.   Yes [provider]  albuterol (VENTOLIN HFA) 108 (90 Base) MCG/ACT inhaler Inhale 2 puffs into the lungs every 6 (six) hours as needed for wheezing or shortness of breath. Patient not taking: No sig reported 02/18/20   [name], DO  apixaban (ELIQUIS) 5 MG TABS tablet Take 1 tablet (5 mg total) by mouth 2 (two) times daily. Patient not taking: Reported on 07/07/2021 05/19/21   Deberah Pelton, NP  Blood Glucose Monitoring Suppl (CONTOUR NEXT ONE) KIT Test BS QID and as needed Dx E11.9 06/04/20   [name], DO  Continuous Blood Gluc Receiver (FREESTYLE LIBRE READER) DEVI 1 applicator by Does not apply route as directed. 08/10/17   Chipper Herb, MD  Continuous Blood Gluc Sensor (FREESTYLE LIBRE SENSOR SYSTEM) MISC Check BS eight (8) times a day. Dx E10.9 06/02/21   Ronnie Doss M, DO  Glucagon, rDNA, (GLUCAGON EMERGENCY) 1 MG KIT INJECT AS DIRECTED INTO  UPPER ARM, THIGH OR  BUTTOCKS AS NEEDED FOR  SEVERE HYPOGLYCEMIA. SEEK  MEDICAL ATTENTION AFTER USE 06/14/21   Loman Brooklyn, FNP  Glucosamine-Chondroit-Vit C-Mn (GLUCOSAMINE 1500 COMPLEX) CAPS Take 2 capsules by mouth daily.    [provider]  glucose blood (ONETOUCH ULTRA) test strip Use as needed E11.9 06/01/20   [name], DO  Microlet Lancets MISC Test BS QID and as needed Dx E11.9 06/04/20  [name], DO    Current Outpatient Medications  Medication Sig Dispense Refill   aspirin 81 MG EC tablet Take 81 mg by mouth daily.     atorvastatin (LIPITOR) 40 MG tablet TAKE 1 TABLET BY MOUTH  DAILY 90 tablet 0   b complex vitamins tablet Take 1 tablet by mouth daily.     Cholecalciferol (VITAMIN D3) 2000 UNITS capsule Take 2,000 Units by mouth daily.     Coenzyme Q10 (COQ10) 200 MG CAPS Take 200 mg by mouth daily.     diltiazem (CARDIZEM) 30 MG tablet Take 1 tablet (30 mg total) by mouth 2 (two) times daily. 180 tablet 3   furosemide (LASIX) 20 MG tablet TAKE 1 AND 1/2 TABLETS BY  MOUTH DAILY 135 tablet 1   insulin lispro (HUMALOG) 100 UNIT/ML injection INJECT SUBCUTANEOUSLY 20 TO 50 UNITS 3 TIMES DAILY  BEFORE MEALS PER SLIDING  SCALE (Patient taking differently: Inject 20-32 Units into the skin See admin instructions. Every 2 hours per sliding scale) 140 mL 0   magnesium oxide (MAG-OX) 400 MG tablet Take 400 mg by mouth daily.     montelukast (SINGULAIR) 10 MG tablet TAKE 1 TABLET BY MOUTH AT  BEDTIME 90 tablet 3   multivitamin-lutein (OCUVITE-LUTEIN) CAPS capsule Take 1 capsule by mouth daily.     pantoprazole (PROTONIX) 40 MG tablet Take 1 tablet (40 mg total) by mouth 2 (two) times daily before a meal. (Patient taking differently: Take 40  mg by mouth daily.) 60 tablet 0   Testosterone 30 MG/ACT SOLN APPLY ONE PUMP TOPICALLY  UNDER EACH AXILLA DAILY  (TOTAL OF 60MG DAILY ) 270 mL 1   TOUJEO MAX SOLOSTAR 300 UNIT/ML Solostar Pen INJECT 110 UNITS INTO THE  SKIN DAILY (Patient taking differently: Inject 80 Units into the skin every evening.) 36 mL 0   Turmeric Curcumin 500 MG CAPS Take 500 mg by mouth daily.     valsartan (DIOVAN) 160 MG tablet Take 80 mg by mouth daily.     albuterol (VENTOLIN HFA) 108 (90 Base) MCG/ACT inhaler Inhale 2 puffs into the lungs every 6 (six) hours as needed for wheezing or shortness of breath. (Patient not taking: No sig reported) 6.7 g 0   apixaban (ELIQUIS) 5 MG TABS tablet Take 1 tablet (5 mg total) by mouth 2 (two) times daily. (Patient not taking: Reported on 07/07/2021) 180 tablet 1   Blood Glucose Monitoring Suppl (CONTOUR NEXT ONE) KIT Test BS QID and as needed Dx E11.9 500 kit 3   Continuous Blood Gluc Receiver (FREESTYLE LIBRE READER) DEVI 1 applicator by Does not apply route as directed. 1 Device 1   Continuous Blood Gluc Sensor (FREESTYLE LIBRE SENSOR SYSTEM) MISC Check BS eight (8) times a day. Dx E10.9 3 each 3   Glucagon, rDNA, (GLUCAGON EMERGENCY) 1 MG KIT INJECT AS DIRECTED INTO  UPPER ARM, THIGH OR  BUTTOCKS AS NEEDED FOR  SEVERE HYPOGLYCEMIA. SEEK  MEDICAL ATTENTION AFTER USE 3 kit 0   Glucosamine-Chondroit-Vit C-Mn (GLUCOSAMINE 1500 COMPLEX) CAPS Take 2 capsules by mouth daily.     glucose blood (ONETOUCH ULTRA) test strip Use as needed E11.9 200 each 12   Microlet Lancets MISC Test BS QID and as needed Dx E11.9 500 each 3   Current Facility-Administered Medications  Medication Dose Route Frequency Provider Last Rate Last Admin   0.9 %  sodium chloride infusion  500 mL Intravenous Once Samer Dutton, Lajuan Lines, MD  Allergies as of 07/07/2021 - Review Complete 07/07/2021  Allergen Reaction Noted   Erythromycin Diarrhea    Sulfonamide derivatives Diarrhea 06/22/2010    Family History   Problem Relation Age of Onset   Heart failure Mother    Heart attack Mother        in her 24's   Alzheimer's disease Mother    Diabetes Mother    Asthma Father    Suicidality Father        in his 27's   Allergic rhinitis Sister    Heart failure Maternal Grandmother    Rheum arthritis Maternal Grandmother    Breast cancer Maternal Grandmother    Heart attack Maternal Grandmother        in her 41's   Colon cancer Neg Hx    Esophageal cancer Neg Hx    Pancreatic cancer Neg Hx    Liver disease Neg Hx     Social History   Socioeconomic History   Marital status: Married    Spouse name: Not on file   Number of children: Not on file   Years of education: Not on file   Highest education level: Not on file  Occupational History   Occupation: retired   Tobacco Use   Smoking status: Former    Years: 34.00    Types: Cigarettes, Pipe    Quit date: 2005    Years since quitting: 17.8   Smokeless tobacco: Never   Tobacco comments:    quit 2005 smoked cigarettes for 5 yrs prior to pipe use  Vaping Use   Vaping Use: Never used  Substance and Sexual Activity   Alcohol use: No    Comment: once a year   Drug use: No   Sexual activity: Yes  Other Topics Concern   Not on file  Social History Narrative   ** Merged History Encounter **       Social Determinants of Health   Financial Resource Strain: Not on file  Food Insecurity: Not on file  Transportation Needs: Not on file  Physical Activity: Not on file  Stress: Not on file  Social Connections: Not on file  Intimate Partner Violence: Not on file    Physical Exam: Vital signs in last 24 hours: @BP  (!) 108/56   Pulse (!) 54   Temp 98 F (36.7 C)   Ht 5' 10"  (1.778 m)   Wt 231 lb (104.8 kg)   SpO2 96%   BMI 33.15 kg/m  GEN: NAD EYE: Sclerae anicteric ENT: MMM CV: Non-tachycardic Pulm: CTA b/l GI: Soft, NT/ND NEURO:  Alert & Oriented x 3   Zenovia Jarred, MD [facility]  07/07/2021 10:26 AM

## 2021-07-07 NOTE — Patient Instructions (Signed)
Okay to resume Eliquis. Watch for dark, black, or tarry stools   YOU HAD AN ENDOSCOPIC PROCEDURE TODAY AT [facility]:   Refer to the procedure report that was given to you for any specific questions about what was found during the examination.  If the procedure report does not answer your questions, please call your gastroenterologist to clarify.  If you requested that your care partner not be given the details of your procedure findings, then the procedure report has been included in a sealed envelope for you to review at your convenience later.  YOU SHOULD EXPECT: Some feelings of bloating in the abdomen. Passage of more gas than usual.  Walking can help get rid of the air that was put into your GI tract during the procedure and reduce the bloating. If you had a lower endoscopy (such as a colonoscopy or flexible sigmoidoscopy) you may notice spotting of blood in your stool or on the toilet paper. If you underwent a bowel prep for your procedure, you may not have a normal bowel movement for a few days.  Please Note:  You might notice some irritation and congestion in your nose or some drainage.  This is from the oxygen used during your procedure.  There is no need for concern and it should clear up in a day or so.  SYMPTOMS TO REPORT IMMEDIATELY:  Following upper endoscopy (EGD)  Vomiting of blood or coffee ground material  New chest pain or pain under the shoulder blades  Painful or persistently difficult swallowing  New shortness of breath  Fever of 100F or higher  Black, tarry-looking stools  For urgent or emergent issues, a gastroenterologist can be reached at any hour by calling [phone]. Do not use MyChart messaging for urgent concerns.    DIET:  We do recommend a small meal at first, but then you may proceed to your regular diet.  Drink plenty of fluids but you should avoid alcoholic beverages for 24 hours.  ACTIVITY:  You should plan to take it easy for the  rest of today and you should NOT DRIVE or use heavy machinery until tomorrow (because of the sedation medicines used during the test).    FOLLOW UP: Our staff will call the number listed on your records 48-72 hours following your procedure to check on you and address any questions or concerns that you may have regarding the information given to you following your procedure. If we do not reach you, we will leave a message.  We will attempt to reach you two times.  During this call, we will ask if you have developed any symptoms of COVID 19. If you develop any symptoms (ie: fever, flu-like symptoms, shortness of breath, cough etc.) before then, please call [phone].  If you test positive for Covid 19 in the 2 weeks post procedure, please call and report this information to [place].    If any biopsies were taken you will be contacted by phone or by letter within the next 1-3 weeks.  Please call us at [phone] if you have not heard about the biopsies in 3 weeks.    SIGNATURES/CONFIDENTIALITY: You and/or your care partner have signed paperwork which will be entered into your electronic medical record.  These signatures attest to the fact that that the information above on your After Visit Summary has been reviewed and is understood.  Full responsibility of the confidentiality of this discharge information lies with you and/or your care-partner.

## 2021-07-08 ENCOUNTER — Other Ambulatory Visit: Payer: Self-pay | Admitting: Family Medicine

## 2021-07-08 DIAGNOSIS — R600 Localized edema: Secondary | ICD-10-CM

## 2021-07-09 ENCOUNTER — Telehealth: Payer: Self-pay | Admitting: *Deleted

## 2021-07-09 LAB — URINE CULTURE

## 2021-07-09 NOTE — Telephone Encounter (Signed)
  Follow up Call-  Call back number 07/07/2021 03/30/2019  Post procedure Call Back phone  # [phone]  Permission to leave phone message Yes Yes  Some recent data might be hidden     Patient questions:  Do you have a fever, pain , or abdominal swelling? No. Pain Score  0 *  Have you tolerated food without any problems? Yes.    Have you been able to return to your normal activities? Yes.    Do you have any questions about your discharge instructions: Diet   No. Medications  No. Follow up visit  No.  Do you have questions or concerns about your Care? No.  Actions: * If pain score is 4 or above: No action needed, pain <4.  Have you developed a fever since your procedure? no  2.   Have you had an respiratory symptoms (SOB or cough) since your procedure? no  3.   Have you tested positive for COVID 19 since your procedure no  4.   Have you had any family members/close contacts diagnosed with the COVID 19 since your procedure?  no   If yes to any of these questions please route to Joylene John, RN and Joella Prince, RN

## 2021-07-14 ENCOUNTER — Encounter: Payer: Self-pay | Admitting: Internal Medicine

## 2021-07-19 DIAGNOSIS — M7918 Myalgia, other site: Secondary | ICD-10-CM | POA: Diagnosis not present

## 2021-07-19 DIAGNOSIS — M25551 Pain in right hip: Secondary | ICD-10-CM | POA: Diagnosis not present

## 2021-07-19 DIAGNOSIS — M961 Postlaminectomy syndrome, not elsewhere classified: Secondary | ICD-10-CM | POA: Diagnosis not present

## 2021-07-23 DIAGNOSIS — E109 Type 1 diabetes mellitus without complications: Secondary | ICD-10-CM | POA: Diagnosis not present

## 2021-08-06 ENCOUNTER — Telehealth: Payer: Self-pay | Admitting: *Deleted

## 2021-08-06 NOTE — Telephone Encounter (Signed)
Rcvd ppw from [employer] requesting documentation within the last 6 mos, part of this request is CGM downloads. LMOVM for pt to bring in his [place] reader so that we can download his readings

## 2021-08-13 DIAGNOSIS — M1611 Unilateral primary osteoarthritis, right hip: Secondary | ICD-10-CM | POA: Diagnosis not present

## 2021-08-19 ENCOUNTER — Encounter: Payer: Self-pay | Admitting: Internal Medicine

## 2021-08-22 DIAGNOSIS — E109 Type 1 diabetes mellitus without complications: Secondary | ICD-10-CM | POA: Diagnosis not present

## 2021-08-24 ENCOUNTER — Other Ambulatory Visit: Payer: Self-pay | Admitting: Otolaryngology

## 2021-08-27 ENCOUNTER — Other Ambulatory Visit: Payer: Self-pay | Admitting: Family Medicine

## 2021-08-27 DIAGNOSIS — Z794 Long term (current) use of insulin: Secondary | ICD-10-CM

## 2021-08-30 NOTE — Progress Notes (Signed)
`      Cardiology Office Note   Date:  09/01/2021   ID:  Tony Cantu, DOB Aug 19, 1951, MRN [id number]  PCP:  [name], DO  Cardiologist:   Minus Breeding, MD    Chief Complaint  Patient presents with   Atrial Fibrillation       History of Present Illness: Tony Cantu is a 71 y.o. male who presents for evaluation of arrhythmia.   He was previously seen by Dr. Aundra Dubin.   He had a cardiolite in 2009 that was a normal study.  He uses CPAP for his sleep apnea. He has mild carotid disease.  The patient was admitted previously  with sepsis.  He has had atrial fib with RVR.   Since I last saw him he has done okay.  He was in the emergency room, with some chest discomfort that resolved thought not to be anginal although there was some excitement because he had some early repolarization changes on his EKG and ventricular ectopy.  He has been borderline enzymes but ultimately was thought that this was GI.  He has been treated by GI and does feel better and is having less discomfort.  He thinks it is related to stress.  He has not been active because of knee pain.  He is going to get a neurostimulator for sleep apnea.  With his limited level of activity he is not able to bring on chest discomfort.  He does not notice any palpitations.  There is been no suggestion of recurrent atrial fibrillation and he did wear a monitor late last year without any fibrillation.  He did have sustained ventricular tachycardia.  He has not had any presyncope or syncope.  He is not any new shortness of breath, PND or orthopnea.  Did review some GI notes in the completed treatment course for C. difficile.    Past Medical History:  Diagnosis Date   Arthritis    Asthma    BPH (benign prostatic hypertrophy)    Cirrhosis (HCC)    Colon polyps    Diabetes mellitus without complication ([place])    TYPE 1    Diverticulosis    Gastric ulcer    Gastric varices    right   Gastritis    GERD (gastroesophageal  reflux disease)    Headache    HX   Hypertension    PONV (postoperative nausea and vomiting)    Renal cyst, right    Seizures (HCC)    HYPOGLYCEMIC LAST 1 AND 1/2 YRS AGO   Sleep apnea    uses CPAP nightly   Testicle trouble    one testicle BORN WITH   Tubular adenoma of colon     Past Surgical History:  Procedure Laterality Date   BACK SURGERY     82  LOWER    CARPAL TUNNEL RELEASE Right 10/13/2015   Procedure: RIGHT CARPAL TUNNEL RELEASE;  Surgeon: Daryll Brod, MD;  Location: [facility];  Service: Orthopedics;  Laterality: Right;   CARPAL TUNNEL RELEASE Left 07/05/2016   Procedure: LEFT CARPAL TUNNEL RELEASE;  Surgeon: Daryll Brod, MD;  Location: [place];  Service: Orthopedics;  Laterality: Left;   DRUG INDUCED ENDOSCOPY N/A 07/02/2021   Procedure: DRUG INDUCED SLEEP ENDOSCOPY;  Surgeon: Melida Quitter, MD;  Location: [facility] OR;  Service: ENT;  Laterality: N/A;   No Name  2003   right    KNEE ARTHROSCOPY Right 03/03/2016  LUMBAR DISC SURGERY  8/96   Dr. Coralyn Mark, discectomy   LUMBAR LAMINECTOMY/DECOMPRESSION MICRODISCECTOMY Right 10/06/2016   Procedure: RIGHT LUMBAR THREE - LUMBAR FOUR  LAMINECTOMY, FORAMINOTOMY AND MICRODISCECTOMY;  Surgeon: Jovita Gamma, MD;  Location: [place];  Service: Neurosurgery;  Laterality: Right;   SHOULDER SURGERY  11/28/05   left partial   SHOULDER SURGERY  07/14/2006   RIGHT   TONSILLECTOMY  AGE 66 OR 4   TOTAL SHOULDER ARTHROPLASTY Right 11/01/2018   Procedure: RIGHT SHOULDER REVISION TO REVERSE TOTAL SHOULDER;  Surgeon: Tania Ade, MD;  Location: [facility];  Service: Orthopedics;  Laterality: Right;  CHOICE ANESTHESIA WITH INTERSCALENE BLOCK EXPAREL, NEEDS RNFA     Current Outpatient Medications  Medication Sig Dispense Refill   albuterol (VENTOLIN HFA) 108 (90 Base) MCG/ACT inhaler Inhale 2 puffs into the lungs every 6 (six) hours as needed for wheezing or shortness of breath. 6.7 g  0   apixaban (ELIQUIS) 5 MG TABS tablet Take 1 tablet (5 mg total) by mouth 2 (two) times daily. 180 tablet 1   aspirin 81 MG EC tablet Take 81 mg by mouth daily.     atorvastatin (LIPITOR) 40 MG tablet TAKE 1 TABLET BY MOUTH  DAILY 90 tablet 1   b complex vitamins tablet Take 1 tablet by mouth daily.     Blood Glucose Monitoring Suppl (CONTOUR NEXT ONE) KIT Test BS QID and as needed Dx E11.9 500 kit 3   Cholecalciferol (VITAMIN D3) 2000 UNITS capsule Take 2,000 Units by mouth daily.     Coenzyme Q10 (COQ10) 200 MG CAPS Take 200 mg by mouth daily.     Continuous Blood Gluc Receiver (FREESTYLE LIBRE READER) DEVI 1 applicator by Does not apply route as directed. 1 Device 1   Continuous Blood Gluc Sensor (FREESTYLE LIBRE SENSOR SYSTEM) MISC Check BS eight (8) times a day. Dx E10.9 3 each 3   diltiazem (CARDIZEM) 30 MG tablet Take 1 tablet (30 mg total) by mouth 2 (two) times daily. 180 tablet 3   furosemide (LASIX) 20 MG tablet TAKE 1 AND 1/2 TABLETS BY  MOUTH DAILY 135 tablet 1   Glucagon, rDNA, (GLUCAGON EMERGENCY) 1 MG KIT INJECT AS DIRECTED INTO  UPPER ARM, THIGH OR  BUTTOCKS AS NEEDED FOR  SEVERE HYPOGLYCEMIA. SEEK  MEDICAL ATTENTION AFTER USE 3 kit 0   Glucosamine-Chondroit-Vit C-Mn (GLUCOSAMINE 1500 COMPLEX) CAPS Take 2 capsules by mouth daily.     glucose blood (ONETOUCH ULTRA) test strip Use as needed E11.9 200 each 12   insulin lispro (HUMALOG) 100 UNIT/ML injection INJECT SUBCUTANEOUSLY 20 TO 50 UNITS 3 TIMES DAILY  BEFORE MEALS PER SLIDING  SCALE (Patient taking differently: Inject 20-32 Units into the skin See admin instructions. Every 2 hours per sliding scale) 140 mL 0   magnesium oxide (MAG-OX) 400 MG tablet Take 400 mg by mouth daily.     Microlet Lancets MISC Test BS QID and as needed Dx E11.9 500 each 3   montelukast (SINGULAIR) 10 MG tablet TAKE 1 TABLET BY MOUTH AT  BEDTIME 90 tablet 3   multivitamin-lutein (OCUVITE-LUTEIN) CAPS capsule Take 1 capsule by mouth daily.      pantoprazole (PROTONIX) 40 MG tablet Take 1 tablet (40 mg total) by mouth 2 (two) times daily before a meal. 180 tablet 3   Testosterone 30 MG/ACT SOLN APPLY ONE PUMP TOPICALLY  UNDER EACH AXILLA DAILY  (TOTAL OF 60MG DAILY ) 270 mL 1   TOUJEO MAX SOLOSTAR 300 UNIT/ML Solostar Pen  INJECT SUBCUTANEOUSLY 110  UNITS DAILY 36 mL 0   Turmeric Curcumin 500 MG CAPS Take 500 mg by mouth daily.     valsartan (DIOVAN) 160 MG tablet TAKE 1 TABLET BY MOUTH  DAILY 90 tablet 1   No current facility-administered medications for this visit.    Allergies:   Erythromycin and Sulfonamide derivatives    ROS:  Please see the history of present illness.   Otherwise, review of systems are positive for none.   All other systems are reviewed and negative.    PHYSICAL EXAM: VS:  BP 118/68    Pulse (!) 48    Ht 5' 8.5" (1.74 m)    Wt 230 lb (104.3 kg)    SpO2 93%    BMI 34.46 kg/m  , BMI Body mass index is 34.46 kg/m.  GENERAL:  Well appearing NECK:  No jugular venous distention, waveform within normal limits, carotid upstroke brisk and symmetric, no bruits, no thyromegaly LUNGS:  Clear to auscultation bilaterally CHEST:  Unremarkable HEART:  PMI not displaced or sustained,S1 and S2 within normal limits, no S3, no S4, no clicks, no rubs, 3 out of 6 apical systolic murmur radiating up aortic outflow tract and mid peaking, no diastolic murmurs ABD:  Flat, positive bowel sounds normal in frequency in pitch, no bruits, no rebound, no guarding, no midline pulsatile mass, no hepatomegaly, no splenomegaly EXT:  2 plus pulses throughout, no edema, no cyanosis no clubbing   EKG:  EKG is  not ordered today.   Recent Labs: 04/22/2021: ALT 44 07/05/2021: BUN 13; Creatinine, Ser 0.96; Hemoglobin 16.6; Platelets 112; Potassium 4.2; Sodium 141    Lipid Panel    Component Value Date/Time   CHOL 108 12/29/2020 0807   CHOL 107 01/07/2013 0840   TRIG 54 12/29/2020 0807   TRIG 59 03/14/2017 0802   TRIG 47 01/07/2013 0840    HDL 55 12/29/2020 0807   HDL 49 03/14/2017 0802   HDL 53 01/07/2013 0840   CHOLHDL 2.0 12/29/2020 0807   LDLCALC 40 12/29/2020 0807   LDLCALC 44 01/03/2014 0803   LDLCALC 45 01/07/2013 0840     Lab Results  Component Value Date   HGBA1C 5.0 04/13/2021    Wt Readings from Last 3 Encounters:  09/01/21 230 lb (104.3 kg)  07/07/21 231 lb (104.8 kg)  07/02/21 235 lb (106.6 kg)      Other studies Reviewed: Additional studies/ records that were reviewed today include:  ED records,ENT records.  Review of the above records demonstrates:   See elsewhere   ASSESSMENT AND PLAN:    Paroxysmal atrial fibrillation:  Mr. RASHEE MARSCHALL has a CHA2DS2 - VASc score of 4.  He has not yet started Eliquis pending his stimulator implant that is not happening until February so he is going to stop taking his aspirin and start his Eliquis.  He has no contraindications from a GI standpoint.   Obstructive sleep apnea: This is going to be managed with hypoglossal muscle stimulator.  Chest discomfort :  This seems to have been GI.  No further cardiac work-up.  Preop:   The patient has no features.  I will be acceptable risk for the planned stimulator implant.   Aortic stenosis:    This was moderate in Nov 2021.  I am going to repeat an echocardiogram.  He has no increased shortness of breath is sedentary.   Current medicines are reviewed at length with the patient today.  The patient does not  have concerns regarding medicines.  The following changes have been made: None  Labs/ tests ordered today include:    Orders Placed This Encounter  Procedures   ECHOCARDIOGRAM COMPLETE      Disposition:   FU with me in 6 months.      Signed, Minus Breeding, MD  09/01/2021 10:43 AM    [facility]

## 2021-09-01 ENCOUNTER — Ambulatory Visit (INDEPENDENT_AMBULATORY_CARE_PROVIDER_SITE_OTHER): Payer: BC Managed Care – PPO | Admitting: Cardiology

## 2021-09-01 ENCOUNTER — Encounter: Payer: Self-pay | Admitting: Cardiology

## 2021-09-01 ENCOUNTER — Other Ambulatory Visit: Payer: Self-pay

## 2021-09-01 VITALS — BP 118/68 | HR 48 | Ht 68.5 in | Wt 230.0 lb

## 2021-09-01 DIAGNOSIS — I480 Paroxysmal atrial fibrillation: Secondary | ICD-10-CM | POA: Diagnosis not present

## 2021-09-01 DIAGNOSIS — G4733 Obstructive sleep apnea (adult) (pediatric): Secondary | ICD-10-CM

## 2021-09-01 DIAGNOSIS — Z9989 Dependence on other enabling machines and devices: Secondary | ICD-10-CM | POA: Diagnosis not present

## 2021-09-01 DIAGNOSIS — R072 Precordial pain: Secondary | ICD-10-CM | POA: Diagnosis not present

## 2021-09-01 DIAGNOSIS — I350 Nonrheumatic aortic (valve) stenosis: Secondary | ICD-10-CM | POA: Diagnosis not present

## 2021-09-01 NOTE — Patient Instructions (Signed)
Medication Instructions:  The current medical regimen is effective;  continue present plan and medications.  *If you need a refill on your cardiac medications before your next appointment, please call your pharmacy*  Testing/Procedures: Your physician has requested that you have an echocardiogram. Echocardiography is a painless test that uses sound waves to create images of your heart. It provides your doctor with information about the size and shape of your heart and how well your hearts chambers and valves are working. This procedure takes approximately one hour. There are no restrictions for this procedure.  Follow-Up: At [facility], you and your health needs are our priority.  As part of our continuing mission to provide you with exceptional heart care, we have created designated Provider Care Teams.  These Care Teams include your primary Cardiologist (physician) and Advanced Practice Providers (APPs -  Physician Assistants and Nurse Practitioners) who all work together to provide you with the care you need, when you need it.  We recommend signing up for the patient portal called "MyChart".  Sign up information is provided on this After Visit Summary.  MyChart is used to connect with patients for Virtual Visits (Telemedicine).  Patients are able to view lab/test results, encounter notes, upcoming appointments, etc.  Non-urgent messages can be sent to your provider as well.   To learn more about what you can do with MyChart, go to [email or web address].    Your next appointment:   6 month(s)  The format for your next appointment:   In Person  Provider:   Minus Breeding, MD    Thank you for choosing [facility]!!

## 2021-09-21 DIAGNOSIS — G4733 Obstructive sleep apnea (adult) (pediatric): Secondary | ICD-10-CM | POA: Diagnosis not present

## 2021-09-22 ENCOUNTER — Other Ambulatory Visit: Payer: Self-pay

## 2021-09-22 ENCOUNTER — Ambulatory Visit (HOSPITAL_COMMUNITY)
Admission: RE | Admit: 2021-09-22 | Discharge: 2021-09-22 | Disposition: A | Discharge status: Home / Self Care | Payer: BC Managed Care – PPO | Source: Ambulatory Visit | Attending: Cardiology | Admitting: Cardiology

## 2021-09-22 DIAGNOSIS — I480 Paroxysmal atrial fibrillation: Secondary | ICD-10-CM | POA: Insufficient documentation

## 2021-09-22 DIAGNOSIS — I350 Nonrheumatic aortic (valve) stenosis: Secondary | ICD-10-CM | POA: Insufficient documentation

## 2021-09-22 LAB — ECHOCARDIOGRAM COMPLETE
AR max vel: 1.36 cm2
AV Area VTI: 1.28 cm2
AV Area mean vel: 1.25 cm2
AV Mean grad: 23.5 mmHg
AV Peak grad: 37.9 mmHg
Ao pk vel: 3.08 m/s
Area-P 1/2: 3.65 cm2
S' Lateral: 4 cm

## 2021-09-22 NOTE — Progress Notes (Signed)
*  PRELIMINARY RESULTS* Echocardiogram 2D Echocardiogram has been performed.  Tony Cantu 09/22/2021, 12:46 PM

## 2021-09-24 DIAGNOSIS — E108 Type 1 diabetes mellitus with unspecified complications: Secondary | ICD-10-CM | POA: Diagnosis not present

## 2021-09-27 NOTE — Pre-Procedure Instructions (Signed)
Surgical Instructions    Your procedure is scheduled on Friday, February 3rd.  Report to [facility] Main Entrance "A" at 05:30 A.M., then check in with the Admitting office.  Call this number if you have problems the morning of surgery:  [phone]   If you have any questions prior to your surgery date call [phone]: Open Monday-Friday 8am-4pm    Remember:  Do not eat or drink after midnight the night before your surgery      Take these medicines the morning of surgery with A SIP OF WATER  atorvastatin (LIPITOR)  diltiazem (CARDIZEM) pantoprazole (PROTONIX)  Follow your surgeon's instructions regarding apixaban (ELIQUIS). If no instructions were given, you need to contact your surgeon for those instructions.  As of today, STOP taking any Aspirin (unless otherwise instructed by your surgeon) Aleve, Naproxen, Ibuprofen, Motrin, Advil, Goody's, BC's, all herbal medications, fish oil, and all vitamins.   WHAT DO I DO ABOUT MY DIABETES MEDICATION?   Do not take oral diabetes medicines (pills) the morning of surgery.  THE DAY BEFORE SURGERY (2/2): Take usual dose of insulin lispro (HUMALOG) before meals. Do not take at bedtime. Take usual dose in the morning of TOUJEO (90 units)     THE MORNING OF SURGERY (2/3), do not take insulin lispro (HUMALOG). Take 72 units (80%) of TOUJEO.  The day of surgery, do not take other diabetes injectables, including Byetta (exenatide), Bydureon (exenatide ER), Victoza (liraglutide), or Trulicity (dulaglutide).  If your CBG is greater than 220 mg/dL, you may take  of your sliding scale (correction) dose of insulin.   HOW TO MANAGE YOUR DIABETES BEFORE AND AFTER SURGERY  Why is it important to control my blood sugar before and after surgery? Improving blood sugar levels before and after surgery helps healing and can limit problems. A way of improving blood sugar control is eating a healthy diet by:  Eating less sugar and carbohydrates   Increasing activity/exercise  Talking with your doctor about reaching your blood sugar goals High blood sugars (greater than 180 mg/dL) can raise your risk of infections and slow your recovery, so you will need to focus on controlling your diabetes during the weeks before surgery. Make sure that the doctor who takes care of your diabetes knows about your planned surgery including the date and location.  How do I manage my blood sugar before surgery? Check your blood sugar at least 4 times a day, starting 2 days before surgery, to make sure that the level is not too high or low.  Check your blood sugar the morning of your surgery when you wake up and every 2 hours until you get to the Short Stay unit.  If your blood sugar is less than 70 mg/dL, you will need to treat for low blood sugar: Do not take insulin. Treat a low blood sugar (less than 70 mg/dL) with  cup of clear juice (cranberry or apple), 4 glucose tablets, OR glucose gel. Recheck blood sugar in 15 minutes after treatment (to make sure it is greater than 70 mg/dL). If your blood sugar is not greater than 70 mg/dL on recheck, call [phone] for further instructions. Report your blood sugar to the short stay nurse when you get to Short Stay.  If you are admitted to the hospital after surgery: Your blood sugar will be checked by the staff and you will probably be given insulin after surgery (instead of oral diabetes medicines) to make sure you have good blood sugar levels. The  goal for blood sugar control after surgery is 80-180 mg/dL.                    Do NOT Smoke (Tobacco/Vaping) for 24 hours prior to your procedure.  If you use a CPAP at night, you may bring your mask/headgear for your overnight stay.   Contacts, glasses, piercing's, hearing aid's, dentures or partials may not be worn into surgery, please bring cases for these belongings.    For patients admitted to the hospital, discharge time will be determined by your  treatment team.   Patients discharged the day of surgery will not be allowed to drive home, and someone needs to stay with them for 24 hours.  NO VISITORS WILL BE ALLOWED IN PRE-OP WHERE PATIENTS ARE PREPPED FOR SURGERY.  ONLY 1 SUPPORT PERSON MAY BE PRESENT IN THE WAITING ROOM WHILE YOU ARE IN SURGERY.  IF YOU ARE TO BE ADMITTED, ONCE YOU ARE IN YOUR ROOM YOU WILL BE ALLOWED TWO (2) VISITORS. (1) VISITOR MAY STAY OVERNIGHT BUT MUST ARRIVE TO THE ROOM BY 8pm.  Minor children may have two parents present. Special consideration for safety and communication needs will be reviewed on a case by case basis.   Special instructions:   [place]- Preparing For Surgery  Before surgery, you can play an important role. Because skin is not sterile, your skin needs to be as free of germs as possible. You can reduce the number of germs on your skin by washing with CHG (chlorahexidine gluconate) Soap before surgery.  CHG is an antiseptic cleaner which kills germs and bonds with the skin to continue killing germs even after washing.    Oral Hygiene is also important to reduce your risk of infection.  Remember - BRUSH YOUR TEETH THE MORNING OF SURGERY WITH YOUR REGULAR TOOTHPASTE  Please do not use if you have an allergy to CHG or antibacterial soaps. If your skin becomes reddened/irritated stop using the CHG.  Do not shave (including legs and underarms) for at least 48 hours prior to first CHG shower. It is OK to shave your face.  Please follow these instructions carefully.   Shower the NIGHT BEFORE SURGERY and the MORNING OF SURGERY  If you chose to wash your hair, wash your hair first as usual with your normal shampoo.  After you shampoo, rinse your hair and body thoroughly to remove the shampoo.  Use CHG Soap as you would any other liquid soap. You can apply CHG directly to the skin and wash gently with a scrungie or a clean washcloth.   Apply the CHG Soap to your body ONLY FROM THE NECK DOWN.  Do not  use on open wounds or open sores. Avoid contact with your eyes, ears, mouth and genitals (private parts). Wash Face and genitals (private parts)  with your normal soap.   Wash thoroughly, paying special attention to the area where your surgery will be performed.  Thoroughly rinse your body with warm water from the neck down.  DO NOT shower/wash with your normal soap after using and rinsing off the CHG Soap.  Pat yourself dry with a CLEAN TOWEL.  Wear CLEAN PAJAMAS to bed the night before surgery  Place CLEAN SHEETS on your bed the night before your surgery  DO NOT SLEEP WITH PETS.   Day of Surgery: Shower with CHG soap. Do not wear jewelry Do not wear lotions, powders, colognes, or deodorant.  Men may shave face and neck. Do not  bring valuables to the hospital. [facility] is not responsible for any belongings or valuables. Wear Clean/Comfortable clothing the morning of surgery Remember to brush your teeth WITH YOUR REGULAR TOOTHPASTE.   Please read over the following fact sheets that you were given.   3 days prior to your procedure    You are not required to quarantine however you are required to wear a well-fitting mask when you are out and around people not in your household. If your mask becomes wet or soiled, replace with a new one.   Wash your hands often with soap and water for 20 seconds or clean your hands with an alcohol-based hand sanitizer that contains at least 60% alcohol.   Do not share personal items.   Notify your provider:  o if you are in close contact with someone who has COVID  o or if you develop a fever of 100.4 or greater, sneezing, cough, sore throat, shortness of breath or body aches.

## 2021-09-28 ENCOUNTER — Ambulatory Visit (HOSPITAL_COMMUNITY)
Admission: RE | Admit: 2021-09-28 | Discharge: 2021-10-01 | Disposition: A | Discharge status: Home / Self Care | Payer: BC Managed Care – PPO | Source: Ambulatory Visit | Attending: Otolaryngology | Admitting: Otolaryngology

## 2021-09-28 ENCOUNTER — Telehealth: Payer: Self-pay | Admitting: Cardiology

## 2021-09-28 ENCOUNTER — Other Ambulatory Visit: Payer: Self-pay

## 2021-09-28 ENCOUNTER — Encounter (HOSPITAL_COMMUNITY)
Admission: RE | Admit: 2021-09-28 | Discharge: 2021-09-28 | Disposition: A | Discharge status: Home / Self Care | Payer: BC Managed Care – PPO | Source: Ambulatory Visit | Attending: Otolaryngology | Admitting: Otolaryngology

## 2021-09-28 ENCOUNTER — Encounter (HOSPITAL_COMMUNITY): Payer: Self-pay

## 2021-09-28 VITALS — BP 116/64 | HR 65 | Temp 98.2°F | Resp 18 | Ht 68.0 in | Wt 230.4 lb

## 2021-09-28 DIAGNOSIS — Z01818 Encounter for other preprocedural examination: Secondary | ICD-10-CM

## 2021-09-28 DIAGNOSIS — E669 Obesity, unspecified: Secondary | ICD-10-CM | POA: Insufficient documentation

## 2021-09-28 DIAGNOSIS — I10 Essential (primary) hypertension: Secondary | ICD-10-CM | POA: Insufficient documentation

## 2021-09-28 DIAGNOSIS — G4733 Obstructive sleep apnea (adult) (pediatric): Secondary | ICD-10-CM | POA: Diagnosis not present

## 2021-09-28 DIAGNOSIS — Z6834 Body mass index (BMI) 34.0-34.9, adult: Secondary | ICD-10-CM | POA: Diagnosis not present

## 2021-09-28 DIAGNOSIS — J45909 Unspecified asthma, uncomplicated: Secondary | ICD-10-CM | POA: Diagnosis not present

## 2021-09-28 DIAGNOSIS — E119 Type 2 diabetes mellitus without complications: Secondary | ICD-10-CM | POA: Insufficient documentation

## 2021-09-28 DIAGNOSIS — E109 Type 1 diabetes mellitus without complications: Secondary | ICD-10-CM

## 2021-09-28 DIAGNOSIS — Z87891 Personal history of nicotine dependence: Secondary | ICD-10-CM | POA: Insufficient documentation

## 2021-09-28 DIAGNOSIS — K7469 Other cirrhosis of liver: Secondary | ICD-10-CM

## 2021-09-28 DIAGNOSIS — K219 Gastro-esophageal reflux disease without esophagitis: Secondary | ICD-10-CM | POA: Insufficient documentation

## 2021-09-28 DIAGNOSIS — E785 Hyperlipidemia, unspecified: Secondary | ICD-10-CM | POA: Insufficient documentation

## 2021-09-28 DIAGNOSIS — Z7901 Long term (current) use of anticoagulants: Secondary | ICD-10-CM | POA: Diagnosis not present

## 2021-09-28 DIAGNOSIS — I350 Nonrheumatic aortic (valve) stenosis: Secondary | ICD-10-CM | POA: Diagnosis not present

## 2021-09-28 DIAGNOSIS — Z01812 Encounter for preprocedural laboratory examination: Secondary | ICD-10-CM | POA: Insufficient documentation

## 2021-09-28 DIAGNOSIS — I480 Paroxysmal atrial fibrillation: Secondary | ICD-10-CM | POA: Insufficient documentation

## 2021-09-28 DIAGNOSIS — K7460 Unspecified cirrhosis of liver: Secondary | ICD-10-CM | POA: Diagnosis not present

## 2021-09-28 DIAGNOSIS — Z794 Long term (current) use of insulin: Secondary | ICD-10-CM | POA: Diagnosis not present

## 2021-09-28 HISTORY — DX: Personal history of urinary calculi: Z87.442

## 2021-09-28 HISTORY — DX: Nonrheumatic aortic (valve) stenosis: I35.0

## 2021-09-28 LAB — COMPREHENSIVE METABOLIC PANEL
ALT: 41 U/L (ref 0–44)
AST: 49 U/L — ABNORMAL HIGH (ref 15–41)
Albumin: 3.2 g/dL — ABNORMAL LOW (ref 3.5–5.0)
Alkaline Phosphatase: 83 U/L (ref 38–126)
Anion gap: 8 (ref 5–15)
BUN: 9 mg/dL (ref 8–23)
CO2: 25 mmol/L (ref 22–32)
Calcium: 9.3 mg/dL (ref 8.9–10.3)
Chloride: 108 mmol/L (ref 98–111)
Creatinine, Ser: 0.92 mg/dL (ref 0.61–1.24)
GFR, Estimated: >60 mL/min (ref >60)
Glucose, Bld: 81 mg/dL (ref 70–99)
Potassium: 3.9 mmol/L (ref 3.5–5.1)
Sodium: 141 mmol/L (ref 135–145)
Total Bilirubin: 1.5 mg/dL — ABNORMAL HIGH (ref 0.3–1.2)
Total Protein: 6.9 g/dL (ref 6.5–8.1)

## 2021-09-28 LAB — CBC
HCT: 51.2 % (ref 39.0–52.0)
Hemoglobin: 17.6 g/dL — ABNORMAL HIGH (ref 13.0–17.0)
MCH: 35.6 pg — ABNORMAL HIGH (ref 26.0–34.0)
MCHC: 34.4 g/dL (ref 30.0–36.0)
MCV: 103.4 fL — ABNORMAL HIGH (ref 80.0–100.0)
Platelets: 122 10*3/uL — ABNORMAL LOW (ref 150–400)
RBC: 4.95 MIL/uL (ref 4.22–5.81)
RDW: 14.7 % (ref 11.5–15.5)
WBC: 7.6 10*3/uL (ref 4.0–10.5)
nRBC: 0 % (ref 0.0–0.2)

## 2021-09-28 LAB — HEMOGLOBIN A1C
Hgb A1c MFr Bld: 4.6 % — ABNORMAL LOW (ref 4.8–5.6)
Mean Plasma Glucose: 85.32 mg/dL

## 2021-09-28 LAB — GLUCOSE, CAPILLARY: Glucose-Capillary: 106 mg/dL — ABNORMAL HIGH (ref 70–99)

## 2021-09-28 NOTE — Telephone Encounter (Signed)
Patient with diagnosis of PAF on Eliquis for anticoagulation.    Procedure:  Implantation of Hypoglossal Nerve Stimulator ( Inspire Implant)   Date of procedure: 10/01/21   CHA2DS2-VASc Score = 4  This indicates a 4.8% annual risk of stroke. The patient's score is based upon: CHF History: 0 HTN History: 1 Diabetes History: 1 Stroke History: 0 Vascular Disease History: 1 Age Score: 1 Gender Score: 0   CrCl 87 ml/min Platelet count 122K  Per office protocol, patient can hold Eliquis for 2 days prior to procedure.  Please restart Eliquis that evening or next morning based on judgement of surgeon.

## 2021-09-28 NOTE — Progress Notes (Signed)
PCP - Dr. Ronnie Doss Cardiologist - Dr. Minus Breeding  PPM/ICD - denies   Chest x-ray - 04/16/21 EKG - 04/21/21 Stress Test - 2009 ECHO - 09/22/21 Cardiac Cath - denies  Sleep Study - 04/07/21, OSA+ CPAP - yes  DM- Type 1 Fasting Blood Sugar - 80-100 Checks Blood Sugar about every 2 hours each day, (continuous sensor)  Blood Thinner Instructions: pt not given instructions on Eliquis. Office notified. They said they will let Dr. Redmond Baseman know that no instructions were given. Pt instructed to hold Eliquis starting today. Last dose 1/31.  Aspirin Instructions: n/a  ERAS Protcol - no, NPO   COVID TEST- n/a, ambulatory surgery   Anesthesia review: yes, cardiac hx  Patient denies shortness of breath, fever, cough and chest pain at PAT appointment   All instructions explained to the patient, with a verbal understanding of the material. Patient agrees to go over the instructions while at home for a better understanding. Patient also instructed to wear a mask for 3 days prior to surgery. The opportunity to ask questions was provided.

## 2021-09-28 NOTE — Telephone Encounter (Signed)
° °  Primary Cardiologist: Minus Breeding, MD  Chart reviewed as part of pre-operative protocol coverage. Given past medical history and time since last visit, based on ACC/AHA guidelines, Tony Cantu would be at acceptable risk for the planned procedure without further cardiovascular testing.   Patient with diagnosis of PAF on Eliquis for anticoagulation.     Procedure:  Implantation of Hypoglossal Nerve Stimulator ( Inspire Implant)   Date of procedure: 10/01/21     CHA2DS2-VASc Score = 4  This indicates a 4.8% annual risk of stroke. The patient's score is based upon: CHF History: 0 HTN History: 1 Diabetes History: 1 Stroke History: 0 Vascular Disease History: 1 Age Score: 1 Gender Score: 0   CrCl 87 ml/min Platelet count 122K   Per office protocol, patient can hold Eliquis for 2 days prior to procedure.  Please restart Eliquis that evening or next morning based on judgement of surgeon.  I will route this recommendation to the requesting party via Epic fax function and remove from pre-op pool.  Please call with questions.  Tony Cantu. Tony Mollica NP-C    09/28/2021, 3:40 PM [facility] [place] [address] Office [phone] Fax [phone]

## 2021-09-28 NOTE — Telephone Encounter (Signed)
° °  Pre-operative Risk Assessment    Patient Name: Tony Cantu  DOB: 02-24-1951 MRN: [id number]      Request for Surgical Clearance    Procedure:  Implantation of Hypoglossal Nerve Stimulator ( Inspire Implant)  Date of Surgery:  Clearance 10/01/21                                 Surgeon:  Dr. Melida Quitter Surgeon's Group or Practice Name:  [facility] Phone number:  Angela Nevin [phone] Fax number:  [phone] Attn: Angela Nevin    Type of Clearance Requested:   - Medical  - Pharmacy:  Hold Apixaban (Eliquis)   Patient took Morning dose of Eliquis 09/28/21. ENT wants to know if patient can still have procedure 10/01/21   Type of Anesthesia:  General    Additional requests/questions:  ENT wants to know when the patient should re-start Eliquis post procedure   Signed, Johnna Acosta   09/28/2021, 10:34 AM

## 2021-09-29 ENCOUNTER — Other Ambulatory Visit: Payer: Self-pay | Admitting: Family Medicine

## 2021-09-29 DIAGNOSIS — J301 Allergic rhinitis due to pollen: Secondary | ICD-10-CM

## 2021-09-29 NOTE — Progress Notes (Signed)
Anesthesia Chart Review:  Follows with cardiology for history of paroxysmal A. fib, moderate AS (mean gradient 20.7 mmHg by echo 06/2020), HTN, HLD, OSA on CPAP.  Last seen 09/01/2021 by Dr. Charlynn Court.  At that time updated echo was ordered to follow moderate aortic stenosis.  Echo 09/22/2021 showed normal LVEF with stable, moderate AAS (mean gradient 23.5 mmHg).  Preop clearance per telephone encounter 09/28/2021, "Chart reviewed as part of pre-operative protocol coverage. Given past medical history and time since last visit, based on ACC/AHA guidelines, Tony Cantu would be at acceptable risk for the planned procedure without further cardiovascular testing. Patient with diagnosis of PAF on Eliquis for anticoagulation.  Procedure:  Implantation of Hypoglossal Nerve Stimulator ( Inspire Implant). Date of procedure: 10/01/21.Marland KitchenMarland KitchenPer office protocol, patient can hold Eliquis for 2 days prior to procedure.  Please restart Eliquis that evening or next morning based on judgement of surgeon."   Insulin-dependent diabetes.  History in epic states type I, however PCP notes state patient has type 2 diabetes.  A1c on preop labs 4.6.   OSA on CPAP.  Last dose Eliquis 09/28/21.  Labs reviewed, unremarkable.   EKG 04/21/2021: Atrial fibrillation.  Ventricular rate 131.  TTE 09/22/2021:  1. Left ventricular ejection fraction, by estimation, is 55 to 60%. The  left ventricle has normal function. The left ventricle demonstrates  regional Pries motion abnormalities (see scoring diagram/findings for  description). Left ventricular diastolic  parameters are consistent with Grade I diastolic dysfunction (impaired  relaxation).   2. Right ventricular systolic function is normal. The right ventricular  size is normal. Tricuspid regurgitation signal is inadequate for assessing  PA pressure.   3. Left atrial size was mildly dilated.   4. The mitral valve is degenerative. Mild mitral valve regurgitation.   5. The aortic valve  is tricuspid. There is moderate calcification of the  aortic valve. Aortic valve regurgitation is mild. Moderate aortic valve  stenosis. Aortic valve area, by VTI measures 1.28 cm. Aortic valve mean  gradient measures 23.5 mmHg.  Dimentionless index 0.41.   6. The inferior vena cava is dilated in size with >50% respiratory  variability, suggesting right atrial pressure of 8 mmHg.   Event monitor 04/30/21: Predominant rhythm was normal sinus SVT noted with the longest run being 7 beats Fairly frequent ventricular ectopy without runs. Runs of ventricular bigeminy and trigeminy.  No sustained arrhythmias.   Carotid duplex 04/29/19: Summary:  Right Carotid: Velocities in the right ICA are consistent with a 1-39% stenosis.  Left Carotid: Velocities in the left ICA are consistent with a 1-39% stenosis.  Vertebrals:  Bilateral vertebral arteries demonstrate antegrade flow.  Subclavians: Normal flow hemodynamics were seen in bilateral subclavian arteries.    Wynonia Musty [facility] Short Stay Center/Anesthesiology Phone [phone] 09/29/2021 2:17 PM

## 2021-09-29 NOTE — Anesthesia Preprocedure Evaluation (Addendum)
Anesthesia Evaluation  Patient identified by MRN, date of birth, ID band Patient awake    Reviewed: Allergy & Precautions, H&P , NPO status , Patient's Chart, lab work & pertinent test results  History of Anesthesia Complications (+) PONV and history of anesthetic complications  Airway Mallampati: II   Neck ROM: full    Dental   Pulmonary asthma , sleep apnea , former smoker,    breath sounds clear to auscultation       Cardiovascular hypertension, + Valvular Problems/Murmurs AS  Rhythm:regular Rate:Normal  TTE (09/22/2021): EF 55-60%, moderate AS.   Neuro/Psych  Headaches, Seizures -,   Neuromuscular disease    GI/Hepatic PUD, GERD  ,  Endo/Other  diabetes, Type 2  Renal/GU      Musculoskeletal  (+) Arthritis ,   Abdominal   Peds  Hematology   Anesthesia Other Findings   Reproductive/Obstetrics                            Anesthesia Physical Anesthesia Plan  ASA: 3  Anesthesia Plan: General   Post-op Pain Management:    Induction: Intravenous  PONV Risk Score and Plan: 3 and Ondansetron, Dexamethasone and Treatment may vary due to age or medical condition  Airway Management Planned: Oral ETT  Additional Equipment:   Intra-op Plan:   Post-operative Plan: Extubation in OR  Informed Consent: I have reviewed the patients History and Physical, chart, labs and discussed the procedure including the risks, benefits and alternatives for the proposed anesthesia with the patient or authorized representative who has indicated his/her understanding and acceptance.     Dental advisory given  Plan Discussed with: CRNA, Anesthesiologist and Surgeon  Anesthesia Plan Comments: (PAT note by Karoline Caldwell, PA-C: Follows with cardiology for history of paroxysmal A. fib, moderate AS(mean gradient 20.7 mmHg by echo 06/2020),HTN, HLD, OSA on CPAP. Last seen 09/01/2021 by Dr. Charlynn Court.  At that time  updated echo was ordered to follow moderate aortic stenosis.  Echo 09/22/2021 showed normal LVEF with stable, moderate AAS (mean gradient 23.5 mmHg).  Preop clearance per telephone encounter 09/28/2021, "Chart reviewed as part of pre-operative protocol coverage. Given past medical history and time since last visit, based on ACC/AHA guidelines,Tony W Wallwould be at acceptable risk for the planned procedure without further cardiovascular testing.Patient with diagnosis ofPAFon Eliquisfor anticoagulation. Procedure:Implantation of Hypoglossal Nerve Stimulator ( Inspire Implant). Date of procedure:10/01/21.Marland KitchenMarland KitchenPer office protocol, patient can holdEliquisfor 2days prior to procedure. Please restart Eliquis that evening or next morning based on judgement of surgeon."   Insulin-dependent diabetes. History in epic states type I, however PCP notes state patient has type 2 diabetes.  A1c on preop labs 4.6.  OSA on CPAP.  Last dose Eliquis 09/28/21.  Labs reviewed, unremarkable.  EKG 04/21/2021: Atrial fibrillation. Ventricular rate 131.  TTE 09/22/2021: 1. Left ventricular ejection fraction, by estimation, is 55 to 60%. The  left ventricle has normal function. The left ventricle demonstrates  regional Crate motion abnormalities (see scoring diagram/findings for  description). Left ventricular diastolic  parameters are consistent with Grade I diastolic dysfunction (impaired  relaxation).  2. Right ventricular systolic function is normal. The right ventricular  size is normal. Tricuspid regurgitation signal is inadequate for assessing  PA pressure.  3. Left atrial size was mildly dilated.  4. The mitral valve is degenerative. Mild mitral valve regurgitation.  5. The aortic valve is tricuspid. There is moderate calcification of the  aortic valve. Aortic valve regurgitation  is mild. Moderate aortic valve  stenosis. Aortic valve area, by VTI measures 1.28 cm. Aortic valve mean  gradient  measures 23.5 mmHg.  Dimentionless index 0.41.  6. The inferior vena cava is dilated in size with >50% respiratory  variability, suggesting right atrial pressure of 8 mmHg.  Event monitor 04/30/21: Predominant rhythm was normal sinus SVT noted with the longest run being 7 beats Fairly frequent ventricular ectopy without runs. Runs of ventricular bigeminy and trigeminy.  No sustained arrhythmias.  Carotid duplex 04/29/19: Summary:  Right Carotid: Velocities in the right ICA are consistent with a 1-39% stenosis.  Left Carotid: Velocities in the left ICA are consistent with a 1-39% stenosis.  Vertebrals: Bilateral vertebral arteries demonstrate antegrade flow.  Subclavians: Normal flow hemodynamics were seen in bilateral subclavian arteries.   )       Anesthesia Quick Evaluation

## 2021-10-01 ENCOUNTER — Ambulatory Visit (HOSPITAL_COMMUNITY): Payer: BC Managed Care – PPO | Admitting: Physician Assistant

## 2021-10-01 ENCOUNTER — Encounter (HOSPITAL_COMMUNITY): Payer: Self-pay | Admitting: Otolaryngology

## 2021-10-01 ENCOUNTER — Other Ambulatory Visit: Payer: Self-pay

## 2021-10-01 ENCOUNTER — Encounter: Payer: Self-pay | Admitting: Family Medicine

## 2021-10-01 ENCOUNTER — Ambulatory Visit (HOSPITAL_COMMUNITY): Payer: BC Managed Care – PPO

## 2021-10-01 ENCOUNTER — Encounter (HOSPITAL_COMMUNITY)
Admission: RE | Disposition: A | Discharge status: Home / Self Care | Payer: Self-pay | Source: Ambulatory Visit | Attending: Otolaryngology

## 2021-10-01 ENCOUNTER — Ambulatory Visit (HOSPITAL_COMMUNITY): Payer: BC Managed Care – PPO | Admitting: Vascular Surgery

## 2021-10-01 DIAGNOSIS — J45909 Unspecified asthma, uncomplicated: Secondary | ICD-10-CM | POA: Insufficient documentation

## 2021-10-01 DIAGNOSIS — Z87891 Personal history of nicotine dependence: Secondary | ICD-10-CM | POA: Insufficient documentation

## 2021-10-01 DIAGNOSIS — E119 Type 2 diabetes mellitus without complications: Secondary | ICD-10-CM | POA: Diagnosis not present

## 2021-10-01 DIAGNOSIS — I10 Essential (primary) hypertension: Secondary | ICD-10-CM | POA: Diagnosis not present

## 2021-10-01 DIAGNOSIS — K219 Gastro-esophageal reflux disease without esophagitis: Secondary | ICD-10-CM | POA: Diagnosis not present

## 2021-10-01 DIAGNOSIS — G4733 Obstructive sleep apnea (adult) (pediatric): Secondary | ICD-10-CM | POA: Insufficient documentation

## 2021-10-01 DIAGNOSIS — M1990 Unspecified osteoarthritis, unspecified site: Secondary | ICD-10-CM | POA: Diagnosis not present

## 2021-10-01 DIAGNOSIS — E669 Obesity, unspecified: Secondary | ICD-10-CM | POA: Diagnosis not present

## 2021-10-01 DIAGNOSIS — Z6834 Body mass index (BMI) 34.0-34.9, adult: Secondary | ICD-10-CM | POA: Diagnosis not present

## 2021-10-01 DIAGNOSIS — Z6833 Body mass index (BMI) 33.0-33.9, adult: Secondary | ICD-10-CM | POA: Diagnosis not present

## 2021-10-01 DIAGNOSIS — R918 Other nonspecific abnormal finding of lung field: Secondary | ICD-10-CM | POA: Diagnosis not present

## 2021-10-01 HISTORY — PX: IMPLANTATION OF HYPOGLOSSAL NERVE STIMULATOR: SHX6827

## 2021-10-01 LAB — GLUCOSE, CAPILLARY
Glucose-Capillary: 108 mg/dL — ABNORMAL HIGH (ref 70–99)
Glucose-Capillary: 95 mg/dL (ref 70–99)
Glucose-Capillary: 139 mg/dL — ABNORMAL HIGH (ref 70–99)

## 2021-10-01 SURGERY — INSERTION, HYPOGLOSSAL NERVE STIMULATOR
Anesthesia: General | Site: Neck | Laterality: Right

## 2021-10-01 MED ORDER — PROPOFOL 500 MG/50ML IV EMUL
INTRAVENOUS | Status: DC | PRN
  Administered 2021-10-01: 75 ug/kg/min via INTRAVENOUS

## 2021-10-01 MED ORDER — CEFAZOLIN SODIUM-DEXTROSE 2-4 GM/100ML-% IV SOLN
INTRAVENOUS | Status: AC
  Filled 2021-10-01: qty 100

## 2021-10-01 MED ORDER — LIDOCAINE 2% (20 MG/ML) 5 ML SYRINGE
INTRAMUSCULAR | Status: DC | PRN
  Administered 2021-10-01: 60 mg via INTRAVENOUS

## 2021-10-01 MED ORDER — 0.9 % SODIUM CHLORIDE (POUR BTL) OPTIME
TOPICAL | Status: DC | PRN
  Administered 2021-10-01: 1000 mL

## 2021-10-01 MED ORDER — ORAL CARE MOUTH RINSE: 15.0000 mL | Freq: Once | OROMUCOSAL | Status: AC

## 2021-10-01 MED ORDER — CHLORHEXIDINE GLUCONATE 0.12 % MT SOLN
OROMUCOSAL | Status: AC
  Administered 2021-10-01: 15 mL via OROMUCOSAL
  Filled 2021-10-01: qty 15

## 2021-10-01 MED ORDER — LIDOCAINE-EPINEPHRINE 1 %-1:100000 IJ SOLN
INTRAMUSCULAR | Status: DC | PRN
  Administered 2021-10-01: 6 mL

## 2021-10-01 MED ORDER — SODIUM CHLORIDE 0.9 % IV SOLN: INTRAVENOUS | Status: DC | PRN

## 2021-10-01 MED ORDER — PROPOFOL 10 MG/ML IV BOLUS
INTRAVENOUS | Status: DC | PRN
  Administered 2021-10-01: 150 mg via INTRAVENOUS

## 2021-10-01 MED ORDER — HYDROCODONE-ACETAMINOPHEN 5-325 MG PO TABS: 1.0000 | ORAL_TABLET | Freq: Four times a day (QID) | ORAL | 0 refills | Status: DC | PRN

## 2021-10-01 MED ORDER — DEXAMETHASONE SODIUM PHOSPHATE 10 MG/ML IJ SOLN
INTRAMUSCULAR | Status: DC | PRN
  Administered 2021-10-01: 10 mg via INTRAVENOUS

## 2021-10-01 MED ORDER — OXYCODONE HCL 5 MG PO TABS: 5.0000 mg | ORAL_TABLET | Freq: Once | ORAL | Status: DC | PRN

## 2021-10-01 MED ORDER — ONDANSETRON HCL 4 MG/2ML IJ SOLN: 4.0000 mg | Freq: Four times a day (QID) | INTRAMUSCULAR | Status: DC | PRN

## 2021-10-01 MED ORDER — OXYCODONE HCL 5 MG/5ML PO SOLN: 5.0000 mg | Freq: Once | ORAL | Status: DC | PRN

## 2021-10-01 MED ORDER — LACTATED RINGERS IV SOLN: INTRAVENOUS | Status: DC

## 2021-10-01 MED ORDER — LIDOCAINE-EPINEPHRINE 1 %-1:100000 IJ SOLN
INTRAMUSCULAR | Status: AC
  Filled 2021-10-01: qty 1

## 2021-10-01 MED ORDER — FENTANYL CITRATE (PF) 250 MCG/5ML IJ SOLN
INTRAMUSCULAR | Status: AC
  Filled 2021-10-01: qty 5

## 2021-10-01 MED ORDER — PHENYLEPHRINE 40 MCG/ML (10ML) SYRINGE FOR IV PUSH (FOR BLOOD PRESSURE SUPPORT)
PREFILLED_SYRINGE | INTRAVENOUS | Status: DC | PRN
  Administered 2021-10-01: 50 ug via INTRAVENOUS

## 2021-10-01 MED ORDER — SUCCINYLCHOLINE CHLORIDE 200 MG/10ML IV SOSY
PREFILLED_SYRINGE | INTRAVENOUS | Status: DC | PRN
Start: 2021-10-01 — End: 2021-10-01
  Administered 2021-10-01: 120 mg via INTRAVENOUS

## 2021-10-01 MED ORDER — PHENYLEPHRINE HCL-NACL 20-0.9 MG/250ML-% IV SOLN
INTRAVENOUS | Status: DC | PRN
  Administered 2021-10-01: 75 ug/min via INTRAVENOUS

## 2021-10-01 MED ORDER — FENTANYL CITRATE (PF) 250 MCG/5ML IJ SOLN
INTRAMUSCULAR | Status: DC | PRN
  Administered 2021-10-01: 50 ug via INTRAVENOUS
  Administered 2021-10-01: 100 ug via INTRAVENOUS

## 2021-10-01 MED ORDER — ONDANSETRON HCL 4 MG/2ML IJ SOLN
INTRAMUSCULAR | Status: DC | PRN
  Administered 2021-10-01: 4 mg via INTRAVENOUS

## 2021-10-01 MED ORDER — FENTANYL CITRATE (PF) 100 MCG/2ML IJ SOLN: 25.0000 ug | INTRAMUSCULAR | Status: DC | PRN

## 2021-10-01 MED ORDER — CEFAZOLIN SODIUM-DEXTROSE 2-4 GM/100ML-% IV SOLN
2.0000 g | INTRAVENOUS | Status: AC
  Administered 2021-10-01: 2 g via INTRAVENOUS

## 2021-10-01 MED ORDER — EPHEDRINE SULFATE-NACL 50-0.9 MG/10ML-% IV SOSY
PREFILLED_SYRINGE | INTRAVENOUS | Status: DC | PRN
  Administered 2021-10-01: 15 mg via INTRAVENOUS
  Administered 2021-10-01: 10 mg via INTRAVENOUS

## 2021-10-01 MED ORDER — CHLORHEXIDINE GLUCONATE 0.12 % MT SOLN: 15.0000 mL | Freq: Once | OROMUCOSAL | Status: AC

## 2021-10-01 SURGICAL SUPPLY — 70 items
BAG COUNTER SPONGE SURGICOUNT (BAG) ×2 IMPLANT
BLADE CLIPPER SURG (BLADE) IMPLANT
BLADE SURG 15 STRL LF DISP TIS (BLADE) ×3 IMPLANT
BLADE SURG 15 STRL SS (BLADE) ×4
CANISTER SUCT 3000ML PPV (MISCELLANEOUS) ×2 IMPLANT
CORD BIPOLAR FORCEPS 12FT (ELECTRODE) ×2 IMPLANT
COVER PROBE W GEL 5X96 (DRAPES) ×2 IMPLANT
COVER SURGICAL LIGHT HANDLE (MISCELLANEOUS) ×2 IMPLANT
DERMABOND ADVANCED (GAUZE/BANDAGES/DRESSINGS) ×2
DERMABOND ADVANCED .7 DNX12 (GAUZE/BANDAGES/DRESSINGS) ×2 IMPLANT
DRAPE C-ARM 35X43 STRL (DRAPES) ×2 IMPLANT
DRAPE HEAD BAR (DRAPES) ×2 IMPLANT
DRAPE INCISE IOBAN 66X45 STRL (DRAPES) ×2 IMPLANT
DRAPE MICROSCOPE LEICA 54X105 (DRAPES) ×2 IMPLANT
DRAPE UTILITY XL STRL (DRAPES) ×2 IMPLANT
DRSG TEGADERM 4X4.75 (GAUZE/BANDAGES/DRESSINGS) ×6 IMPLANT
ELECT COATED BLADE 2.86 ST (ELECTRODE) ×2 IMPLANT
ELECT EMG 18 NIMS (NEUROSURGERY SUPPLIES) ×2
ELECT REM PT RETURN 9FT ADLT (ELECTROSURGICAL) ×2
ELECTRODE EMG 18 NIMS (NEUROSURGERY SUPPLIES) ×1 IMPLANT
ELECTRODE REM PT RTRN 9FT ADLT (ELECTROSURGICAL) ×1 IMPLANT
FORCEPS BIPOLAR SPETZLER 8 1.0 (NEUROSURGERY SUPPLIES) ×2 IMPLANT
GAUZE 4X4 16PLY ~~LOC~~+RFID DBL (SPONGE) ×2 IMPLANT
GAUZE SPONGE 4X4 12PLY STRL (GAUZE/BANDAGES/DRESSINGS) ×2 IMPLANT
GENERATOR PULSE INSPIRE (Generator) ×2 IMPLANT
GENERATOR PULSE INSPIRE IV (Generator) ×1 IMPLANT
GLOVE SURG ENC MOIS LTX SZ6.5 (GLOVE) IMPLANT
GLOVE SURG ENC MOIS LTX SZ7.5 (GLOVE) ×2 IMPLANT
GLOVE SURG GAMMEX LF SZ6.5 (GLOVE) ×1 IMPLANT
GLOVE SURG POLYISO LF SZ6 (GLOVE) ×1 IMPLANT
GLOVE SURG POLYISO LF SZ6.5 (GLOVE) ×1 IMPLANT
GLOVE SURG UNDER POLY LF SZ6.5 (GLOVE) ×1 IMPLANT
GLOVE SURG UNDER POLY LF SZ7 (GLOVE) ×2 IMPLANT
GOWN STRL REUS W/ TWL LRG LVL3 (GOWN DISPOSABLE) ×3 IMPLANT
GOWN STRL REUS W/TWL LRG LVL3 (GOWN DISPOSABLE) ×8
KIT BASIN OR (CUSTOM PROCEDURE TRAY) ×2 IMPLANT
KIT NEURO ACCESSORY W/WRENCH (MISCELLANEOUS) IMPLANT
KIT TURNOVER KIT B (KITS) ×2 IMPLANT
LEAD SENSING RESP INSPIRE (Lead) ×2 IMPLANT
LEAD SENSING RESP INSPIRE IV (Lead) ×1 IMPLANT
LEAD SLEEP STIM INSPIRE IV/V (Lead) ×1 IMPLANT
LEAD SLEEP STIMULATION INSPIRE (Lead) ×2 IMPLANT
LOOP VESSEL MAXI BLUE (MISCELLANEOUS) ×2 IMPLANT
LOOP VESSEL MINI RED (MISCELLANEOUS) ×2 IMPLANT
MARKER SKIN DUAL TIP RULER LAB (MISCELLANEOUS) ×5 IMPLANT
NDL HYPO 25GX1X1/2 BEV (NEEDLE) ×1 IMPLANT
NEEDLE HYPO 25GX1X1/2 BEV (NEEDLE) ×2 IMPLANT
NS IRRIG 1000ML POUR BTL (IV SOLUTION) ×2 IMPLANT
PAD ARMBOARD 7.5X6 YLW CONV (MISCELLANEOUS) ×2 IMPLANT
PASSER CATH 38CM DISP (INSTRUMENTS) ×2 IMPLANT
PENCIL SMOKE EVACUATOR (MISCELLANEOUS) ×2 IMPLANT
POSITIONER HEAD DONUT 9IN (MISCELLANEOUS) ×2 IMPLANT
PROBE NERVE STIMULATOR (NEUROSURGERY SUPPLIES) ×2 IMPLANT
REMOTE CONTROL SLEEP INSPIRE (MISCELLANEOUS) ×2 IMPLANT
SET WALTER ACTIVATION W/DRAPE (SET/KITS/TRAYS/PACK) ×2 IMPLANT
SPONGE INTESTINAL PEANUT (DISPOSABLE) ×2 IMPLANT
STAPLER VISISTAT 35W (STAPLE) ×2 IMPLANT
SUT SILK 2 0 SH (SUTURE) ×2 IMPLANT
SUT SILK 3 0 REEL (SUTURE) ×2 IMPLANT
SUT SILK 3 0 SH 30 (SUTURE) ×5 IMPLANT
SUT SILK 3-0 (SUTURE) ×2
SUT SILK 3-0 RB1 30XBRD (SUTURE) ×1
SUT VIC AB 3-0 SH 27 (SUTURE) ×4
SUT VIC AB 3-0 SH 27X BRD (SUTURE) ×2 IMPLANT
SUT VIC AB 4-0 PS2 27 (SUTURE) ×4 IMPLANT
SUTURE SILK 3-0 RB1 30XBRD (SUTURE) ×1 IMPLANT
SYR 10ML LL (SYRINGE) ×2 IMPLANT
TAPE CLOTH SURG 4X10 WHT LF (GAUZE/BANDAGES/DRESSINGS) ×2 IMPLANT
TOWEL GREEN STERILE (TOWEL DISPOSABLE) ×2 IMPLANT
TRAY ENT MC OR (CUSTOM PROCEDURE TRAY) ×2 IMPLANT

## 2021-10-01 NOTE — H&P (Signed)
Tony Cantu is an 71 y.o. male.   Chief Complaint: Sleep apnea HPI: 71 year old male with obstructive sleep apnea who has been unable to tolerate CPAP.  Past Medical History:  Diagnosis Date   Aortic stenosis    Arthritis    Asthma    BPH (benign prostatic hypertrophy)    Cirrhosis ([place]) 2016   Colon polyps    Diabetes mellitus without complication ([place])    TYPE 1    Diverticulosis    Dysrhythmia 04/19/2021   A-fib noted with possible A-fib RVR   Gastric ulcer    Gastric varices    right   Gastritis    GERD (gastroesophageal reflux disease)    Headache    History- pt states these were migraines that occurred in the 1980's   History of kidney stones    Hypertension    PONV (postoperative nausea and vomiting)    pt states he has never had post op nausea or vomiting   Renal cyst, right    Seizures ([facility])    HYPOGLYCEMIC LAST 1 AND 1/2 YRS AGO   Sleep apnea    uses CPAP nightly   Testicle trouble    one testicle BORN WITH   Tubular adenoma of colon     Past Surgical History:  Procedure Laterality Date   BACK SURGERY     40  LOWER    CARPAL TUNNEL RELEASE Right 10/13/2015   Procedure: RIGHT CARPAL TUNNEL RELEASE;  Surgeon: Daryll Brod, MD;  Location: [place];  Service: Orthopedics;  Laterality: Right;   CARPAL TUNNEL RELEASE Left 07/05/2016   Procedure: LEFT CARPAL TUNNEL RELEASE;  Surgeon: Daryll Brod, MD;  Location: [place];  Service: Orthopedics;  Laterality: Left;   DRUG INDUCED ENDOSCOPY N/A 07/02/2021   Procedure: DRUG INDUCED SLEEP ENDOSCOPY;  Surgeon: Melida Quitter, MD;  Location: [place];  Service: ENT;  Laterality: N/A;   INGUINAL HERNIA REPAIR  2003   right    KNEE ARTHROSCOPY Right 03/03/2016   LUMBAR DISC SURGERY  03/1995   Dr. Coralyn Mark, discectomy   LUMBAR LAMINECTOMY/DECOMPRESSION MICRODISCECTOMY Right 10/06/2016   Procedure: RIGHT LUMBAR THREE - LUMBAR FOUR  LAMINECTOMY, FORAMINOTOMY AND MICRODISCECTOMY;  Surgeon:  Jovita Gamma, MD;  Location: [place];  Service: Neurosurgery;  Laterality: Right;   SHOULDER SURGERY  11/28/2005   left partial   SHOULDER SURGERY  07/14/2006   RIGHT   TONSILLECTOMY  AGE 23 OR 4   TOTAL SHOULDER ARTHROPLASTY Right 11/01/2018   Procedure: RIGHT SHOULDER REVISION TO REVERSE TOTAL SHOULDER;  Surgeon: Tania Ade, MD;  Location: [facility];  Service: Orthopedics;  Laterality: Right;  CHOICE ANESTHESIA WITH INTERSCALENE BLOCK EXPAREL, NEEDS RNFA    Family History  Problem Relation Age of Onset   Heart failure Mother    Heart attack Mother        in her 38's   Alzheimer's disease Mother    Diabetes Mother    Asthma Father    Suicidality Father        in his 59's   Allergic rhinitis Sister    Heart failure Maternal Grandmother    Rheum arthritis Maternal Grandmother    Breast cancer Maternal Grandmother    Heart attack Maternal Grandmother        in her 24's   Colon cancer Neg Hx    Esophageal cancer Neg Hx    Pancreatic cancer Neg Hx    Liver disease Neg Hx    Social  History:  reports that he quit smoking about 18 years ago. His smoking use included cigarettes and pipe. He has never used smokeless tobacco. He reports that he does not currently use alcohol. He reports that he does not use drugs.  Allergies:  Allergies  Allergen Reactions   Erythromycin Diarrhea   Sulfonamide Derivatives Diarrhea    Medications Prior to Admission  Medication Sig Dispense Refill   apixaban (ELIQUIS) 5 MG TABS tablet Take 1 tablet (5 mg total) by mouth 2 (two) times daily. 180 tablet 1   atorvastatin (LIPITOR) 40 MG tablet TAKE 1 TABLET BY MOUTH  DAILY 90 tablet 1   b complex vitamins tablet Take 1 tablet by mouth daily.     Cholecalciferol (VITAMIN D3) 2000 UNITS capsule Take 2,000 Units by mouth daily.     Coenzyme Q10 (COQ10) 200 MG CAPS Take 200 mg by mouth daily.     diltiazem (CARDIZEM) 30 MG tablet Take 1 tablet (30 mg total) by mouth 2 (two) times daily. 180 tablet 3    DM-Phenylephrine-Acetaminophen (COLD RELIEF PO) Take 1-2 tablets by mouth every 6 (six) hours as needed (cold symptoms).     furosemide (LASIX) 20 MG tablet TAKE 1 AND 1/2 TABLETS BY  MOUTH DAILY 135 tablet 1   Glucagon, rDNA, (GLUCAGON EMERGENCY) 1 MG KIT INJECT AS DIRECTED INTO  UPPER ARM, THIGH OR  BUTTOCKS AS NEEDED FOR  SEVERE HYPOGLYCEMIA. SEEK  MEDICAL ATTENTION AFTER USE 3 kit 0   Glucosamine-Chondroit-Vit C-Mn (GLUCOSAMINE 1500 COMPLEX) CAPS Take 2 capsules by mouth daily.     insulin lispro (HUMALOG) 100 UNIT/ML injection INJECT SUBCUTANEOUSLY 20 TO 50 UNITS 3 TIMES DAILY  BEFORE MEALS PER SLIDING  SCALE (Patient taking differently: Inject 20-32 Units into the skin 3 (three) times daily with meals.) 140 mL 0   magnesium oxide (MAG-OX) 400 MG tablet Take 400 mg by mouth daily.     montelukast (SINGULAIR) 10 MG tablet TAKE 1 TABLET BY MOUTH AT  BEDTIME 90 tablet 3   multivitamin-lutein (OCUVITE-LUTEIN) CAPS capsule Take 1 capsule by mouth daily.     pantoprazole (PROTONIX) 40 MG tablet Take 1 tablet (40 mg total) by mouth 2 (two) times daily before a meal. 180 tablet 3   Testosterone 30 MG/ACT SOLN APPLY ONE PUMP TOPICALLY  UNDER EACH AXILLA DAILY  (TOTAL OF 60MG DAILY ) 270 mL 1   TOUJEO MAX SOLOSTAR 300 UNIT/ML Solostar Pen INJECT SUBCUTANEOUSLY 110  UNITS DAILY (Patient taking differently: Inject 90 Units into the skin daily.) 36 mL 0   Turmeric Curcumin 500 MG CAPS Take 500 mg by mouth daily.     valsartan (DIOVAN) 160 MG tablet TAKE 1 TABLET BY MOUTH  DAILY (Patient taking differently: Take 80 mg by mouth daily.) 90 tablet 1   albuterol (VENTOLIN HFA) 108 (90 Base) MCG/ACT inhaler Inhale 2 puffs into the lungs every 6 (six) hours as needed for wheezing or shortness of breath. (Patient not taking: Reported on 09/21/2021) 6.7 g 0   Blood Glucose Monitoring Suppl (CONTOUR NEXT ONE) KIT Test BS QID and as needed Dx E11.9 500 kit 3   Continuous Blood Gluc Receiver (FREESTYLE LIBRE READER) DEVI  1 applicator by Does not apply route as directed. 1 Device 1   Continuous Blood Gluc Sensor (FREESTYLE LIBRE SENSOR SYSTEM) MISC Check BS eight (8) times a day. Dx E10.9 3 each 3   glucose blood (ONETOUCH ULTRA) test strip Use as needed E11.9 200 each 12   Microlet Lancets  MISC Test BS QID and as needed Dx E11.9 500 each 3    Results for orders placed or performed during the hospital encounter of 10/01/21 (from the past 48 hour(s))  Glucose, capillary     Status: Abnormal   Collection Time: 10/01/21  5:57 AM  Result Value Ref Range   Glucose-Capillary 108 (H) 70 - 99 mg/dL    Comment: Glucose reference range applies only to samples taken after fasting for at least 8 hours.   No results found.  Review of Systems  All other systems reviewed and are negative.  Blood pressure (!) 115/48, pulse 62, temperature 98.2 F (36.8 C), temperature source Oral, resp. rate 17, height _0  (1.727 m), weight 104.3 kg, SpO2 95 %. Physical Exam Constitutional:      Appearance: Normal appearance. He is normal weight.  HENT:     Head: Normocephalic and atraumatic.     Right Ear: External ear normal.     Left Ear: External ear normal.     Nose: Nose normal.     Mouth/Throat:     Mouth: Mucous membranes are moist.     Pharynx: Oropharynx is clear.  Eyes:     Extraocular Movements: Extraocular movements intact.     Conjunctiva/sclera: Conjunctivae normal.     Pupils: Pupils are equal, round, and reactive to light.  Cardiovascular:     Rate and Rhythm: Normal rate.  Pulmonary:     Effort: Pulmonary effort is normal.  Musculoskeletal:     Cervical back: Normal range of motion.  Skin:    General: Skin is warm and dry.  Neurological:     General: No focal deficit present.     Mental Status: He is alert and oriented to person, place, and time.  Psychiatric:        Mood and Affect: Mood normal.        Behavior: Behavior normal.        Thought Content: Thought content normal.        Judgment:  Judgment normal.     Assessment/Plan Obstructive sleep apnea and BMI 34.97  To OR for hypoglossal nerve stimulator placement.  Melida Quitter, MD 10/01/2021, 7:23 AM

## 2021-10-01 NOTE — Brief Op Note (Signed)
10/01/2021  9:38 AM  PATIENT:  Tony Cantu  71 y.o. male  PRE-OPERATIVE DIAGNOSIS:  Obstructive sleep apnea  POST-OPERATIVE DIAGNOSIS:  Obstructive sleep apnea  PROCEDURE:  Procedure(s): IMPLANTATION OF HYPOGLOSSAL NERVE STIMULATOR (Right)  SURGEON:  Surgeon(s) and Role:    Melida Quitter, MD - Primary  PHYSICIAN ASSISTANT:   ASSISTANTS: none   ANESTHESIA:   general  EBL:  10 mL   BLOOD ADMINISTERED:none  DRAINS: none   LOCAL MEDICATIONS USED:  LIDOCAINE   SPECIMEN:  No Specimen  DISPOSITION OF SPECIMEN:  N/A  COUNTS:  YES  TOURNIQUET:  * No tourniquets in log *  DICTATION: .Note written in EPIC  PLAN OF CARE: Discharge to home after PACU  PATIENT DISPOSITION:  PACU - hemodynamically stable.   Delay start of Pharmacological VTE agent (>24hrs) due to surgical blood loss or risk of bleeding: no

## 2021-10-01 NOTE — Transfer of Care (Signed)
Immediate Anesthesia Transfer of Care Note  Patient: Tony Cantu  Procedure(s) Performed: IMPLANTATION OF HYPOGLOSSAL NERVE STIMULATOR (Right: Neck)  Patient Location: PACU  Anesthesia Type:General  Level of Consciousness: awake, alert  and oriented  Airway & Oxygen Therapy: Patient Spontanous Breathing  Post-op Assessment: Report given to RN and Post -op Vital signs reviewed and stable  Post vital signs: Reviewed and stable  Last Vitals:  Vitals Value Taken Time  BP 116/53 10/01/21 0948  Temp    Pulse 82 10/01/21 0950  Resp 15 10/01/21 0950  SpO2 92 % 10/01/21 0950  Vitals shown include unvalidated device data.  Last Pain:  Vitals:   10/01/21 0618  TempSrc:   PainSc: 0-No pain         Complications: No notable events documented.

## 2021-10-01 NOTE — Anesthesia Procedure Notes (Addendum)
Procedure Name: Intubation Date/Time: 10/01/2021 7:45 AM Performed by: Minerva Ends, CRNA Pre-anesthesia Checklist: Patient identified, Emergency Drugs available, Suction available and Patient being monitored Patient Re-evaluated:Patient Re-evaluated prior to induction Oxygen Delivery Method: Circle system utilized Preoxygenation: Pre-oxygenation with 100% oxygen Induction Type: IV induction Ventilation: Mask ventilation without difficulty Laryngoscope Size: Mac, 3 and Glidescope Grade View: Grade I Tube type: Oral Tube size: 7.0 mm Number of attempts: 2 Airway Equipment and Method: Stylet Placement Confirmation: ETT inserted through vocal cords under direct vision, positive ETCO2 and breath sounds checked- equal and bilateral Secured at: 23 cm Tube secured with: Tape Dental Injury: Teeth and Oropharynx as per pre-operative assessment  Comments: Attempt 1:  DL x 1 unsuccessful due to grade 3 view and small mouth opening Attempt 2:  Glidescope attempt successful grade 1 view

## 2021-10-01 NOTE — Op Note (Signed)

## 2021-10-02 ENCOUNTER — Encounter: Payer: Self-pay | Admitting: Internal Medicine

## 2021-10-02 ENCOUNTER — Encounter: Payer: Self-pay | Admitting: Family Medicine

## 2021-10-04 ENCOUNTER — Encounter (HOSPITAL_COMMUNITY): Payer: Self-pay | Admitting: Otolaryngology

## 2021-10-04 DIAGNOSIS — N182 Chronic kidney disease, stage 2 (mild): Secondary | ICD-10-CM | POA: Diagnosis not present

## 2021-10-04 DIAGNOSIS — E1122 Type 2 diabetes mellitus with diabetic chronic kidney disease: Secondary | ICD-10-CM | POA: Diagnosis not present

## 2021-10-04 DIAGNOSIS — Z794 Long term (current) use of insulin: Secondary | ICD-10-CM | POA: Diagnosis not present

## 2021-10-04 NOTE — Anesthesia Postprocedure Evaluation (Signed)
Anesthesia Post Note  Patient: Tony Cantu  Procedure(Cantu) Performed: IMPLANTATION OF HYPOGLOSSAL NERVE STIMULATOR (Right: Neck)     Patient location during evaluation: PACU Anesthesia Type: General Level of consciousness: awake and alert Pain management: pain level controlled Vital Signs Assessment: post-procedure vital signs reviewed and stable Respiratory status: spontaneous breathing, nonlabored ventilation, respiratory function stable and patient connected to nasal cannula oxygen Cardiovascular status: blood pressure returned to baseline and stable Postop Assessment: no apparent nausea or vomiting Anesthetic complications: no   No notable events documented.  Last Vitals:  Vitals:   10/01/21 1020 10/01/21 1035  BP: (!) 100/57 (!) 93/53  Pulse: 78   Resp: 15 14  Temp:    SpO2: 91% 93%    Last Pain:  Vitals:   10/01/21 1035  TempSrc:   PainSc: 0-No pain                 Tony Cantu

## 2021-10-14 ENCOUNTER — Encounter: Payer: Self-pay | Admitting: Family Medicine

## 2021-10-15 ENCOUNTER — Other Ambulatory Visit: Payer: Self-pay | Admitting: Family Medicine

## 2021-10-15 DIAGNOSIS — R7989 Other specified abnormal findings of blood chemistry: Secondary | ICD-10-CM

## 2021-10-20 ENCOUNTER — Other Ambulatory Visit: Payer: Medicare Other

## 2021-10-20 DIAGNOSIS — R7989 Other specified abnormal findings of blood chemistry: Secondary | ICD-10-CM | POA: Diagnosis not present

## 2021-10-21 LAB — CMP14+EGFR
ALT: 31 IU/L (ref 0–44)
AST: 39 IU/L (ref 0–40)
Albumin/Globulin Ratio: 1.3 (ref 1.2–2.2)
Albumin: 3.7 g/dL (ref 3.7–4.7)
Alkaline Phosphatase: 96 IU/L (ref 44–121)
BUN/Creatinine Ratio: 11 (ref 10–24)
BUN: 11 mg/dL (ref 8–27)
Bilirubin Total: 0.8 mg/dL (ref 0.0–1.2)
CO2: 22 mmol/L (ref 20–29)
Calcium: 9.7 mg/dL (ref 8.6–10.2)
Chloride: 103 mmol/L (ref 96–106)
Creatinine, Ser: 1 mg/dL (ref 0.76–1.27)
Globulin, Total: 2.9 g/dL (ref 1.5–4.5)
Glucose: 100 mg/dL — ABNORMAL HIGH (ref 70–99)
Potassium: 3.4 mmol/L — ABNORMAL LOW (ref 3.5–5.2)
Sodium: 140 mmol/L (ref 134–144)
Total Protein: 6.6 g/dL (ref 6.0–8.5)
eGFR: 80 mL/min/{1.73_m2} (ref >59)

## 2021-11-02 ENCOUNTER — Ambulatory Visit (INDEPENDENT_AMBULATORY_CARE_PROVIDER_SITE_OTHER): Payer: BC Managed Care – PPO | Admitting: Pulmonary Disease

## 2021-11-02 ENCOUNTER — Other Ambulatory Visit: Payer: Self-pay

## 2021-11-02 ENCOUNTER — Encounter: Payer: Self-pay | Admitting: Pulmonary Disease

## 2021-11-02 VITALS — BP 134/64 | HR 78 | Temp 98.0°F | Ht 68.0 in | Wt 233.6 lb

## 2021-11-02 DIAGNOSIS — G4733 Obstructive sleep apnea (adult) (pediatric): Secondary | ICD-10-CM | POA: Diagnosis not present

## 2021-11-02 NOTE — Progress Notes (Signed)
? ?[name] ? ?Chief Complaint  ?Patient presents with  ? Follow-up  ?  Sleep apnea - had Inspire device implanted  ? ? ?Past Surgical History:  ?He  has a past surgical history that includes Lumbar disc surgery (03/1995); Inguinal hernia repair (2003); Shoulder surgery (11/28/2005); Shoulder surgery (07/14/2006); Carpal tunnel release (Right, 10/13/2015); Knee arthroscopy (Right, 03/03/2016); Carpal tunnel release (Left, 07/05/2016); Lumbar laminectomy/decompression microdiscectomy (Right, 10/06/2016); Back surgery; Tonsillectomy (AGE 79 OR 4); Total shoulder arthroplasty (Right, 11/01/2018); Drug induced endoscopy (N/A, 07/02/2021); and Implantation of hypoglossal nerve stimulator (Right, 10/01/2021). ? ?Past Medical History:  ?TMJ, Aortic stenosis, OA, BPH, Cirrhosis, Colon polyps, Diabetes, A fib, PUD, GERD, Nephrolithiasis, Hypertension, C diff colitis ? ?Constitutional:  ?BP 134/64 (BP Location: Right Arm, Patient Position: Sitting, Cuff Size: Normal)   Pulse 78   Temp 98 ?F (36.7 ?C) (Oral)   Ht _0  (1.727 m)   Wt 233 lb 9.6 oz (106 kg)   SpO2 96%   BMI 35.52 kg/m?  ? ?Brief Summary:  ?Tony Cantu is a 71 y.o. male with obstructive sleep apnea.  He had Inspire device implanted 10/01/21. ?  ? ? ? ?Subjective:  ? ?I last saw him in 2019.  More recently seen by Derl Barrow.  Was not able to tolerate CPAP therapy use.  He was assessed by Dr. Melida Quitter with ENT and had Inspire device implanted recently. ? ?Physical Exam:  ? ?Appearance - well kempt  ? ?ENMT - no sinus tenderness, no oral exudate, no LAN, Mallampati 3 airway, no stridor ? ?Respiratory - equal breath sounds bilaterally, no wheezing or rales ? ?CV - s1s2 regular rate and rhythm, 2/6 ? ?Ext - no clubbing, no edema ? ?Skin - no rashes ? ?Psych - normal mood and affect ?  ?Pulmonary testing:  ?PFT 02/09/16 >> FEV1 2.38 (70%), FEV1% 84, TLC 4.96 (70%), DLCO 78% ? ?Sleep Tests:  ?PSG 05/02/12 >> AHI  76 ?Home sleep study 04/07/21 >> AHI 49.3, SpO2 low 79% ? ?Cardiac Tests:  ?Echo 09/22/21 >> EF 55 to 60%, grade 1 DD, mild LA dilation, mild MR, mod AS ? ?Social History:  ?He  reports that he quit smoking about 18 years ago. His smoking use included cigarettes and pipe. He has never used smokeless tobacco. He reports that he does not currently use alcohol. He reports that he does not use drugs. ? ?Family History:  ?His family history includes Allergic rhinitis in his sister; Alzheimer's disease in his mother; Asthma in his father; Breast cancer in his maternal grandmother; Diabetes in his mother; Heart attack in his maternal grandmother and mother; Heart failure in his maternal grandmother and mother; Rheum arthritis in his maternal grandmother; Suicidality in his father. ?  ? ? ?Assessment/Plan:  ? ?Obstructive sleep apnea. ?- he was not able to tolerate CPAP therapy ?- had Inspire device implanted 10/01/21 by Dr. Melida Quitter ?- had activation of device today ?- he was instructed on how to gradually increase voltage ?- he will have check in again in 4 weeks to assess progress, and then reassess in office in 8 weeks; if doing well then will have in lab study set up ? ?Paroxysmal atrial fibrillation, Aortic stenosis. ?- followed by Dr. Minus Breeding with Cardiology ? ?Time Spent Involved in Patient Care on Day of Examination:  ?38 minutes ? ?Follow up:  ? ?Patient Instructions  ?Follow up in 4 weeks ? ?Medication List:  ? ?Allergies as of  11/02/2021   ? ?   Reactions  ? Erythromycin Diarrhea  ? Sulfonamide Derivatives Diarrhea  ? ?  ? ?  ?Medication List  ?  ? ?  ? Accurate as of November 02, 2021  5:10 PM. If you have any questions, ask your nurse or doctor.  ?  ?  ? ?  ? ?albuterol 108 (90 Base) MCG/ACT inhaler ?Commonly known as: VENTOLIN HFA ?Inhale 2 puffs into the lungs every 6 (six) hours as needed for wheezing or shortness of breath. ?  ?apixaban 5 MG Tabs tablet ?Commonly known as: ELIQUIS ?Take 1 tablet (5 mg  total) by mouth 2 (two) times daily. ?  ?atorvastatin 40 MG tablet ?Commonly known as: LIPITOR ?TAKE 1 TABLET BY MOUTH  DAILY ?  ?b complex vitamins tablet ?Take 1 tablet by mouth daily. ?  ?COLD RELIEF PO ?Take 1-2 tablets by mouth every 6 (six) hours as needed (cold symptoms). ?  ?Contour Next One Kit ?Test BS QID and as needed Dx E11.9 ?  ?CoQ10 200 MG Caps ?Take 200 mg by mouth daily. ?  ?diltiazem 30 MG tablet ?Commonly known as: Cardizem ?Take 1 tablet (30 mg total) by mouth 2 (two) times daily. ?  ?FreeStyle [employer] ?1 applicator by Does not apply route as directed. ?  ?[place] ?Check BS eight (8) times a day. Dx E10.9 ?  ?furosemide 20 MG tablet ?Commonly known as: LASIX ?TAKE 1 AND 1/2 TABLETS BY  MOUTH DAILY ?  ?Glucagon Emergency 1 MG Kit ?INJECT AS DIRECTED INTO  UPPER ARM, THIGH OR  BUTTOCKS AS NEEDED FOR  SEVERE HYPOGLYCEMIA. SEEK  MEDICAL ATTENTION AFTER USE ?  ?Glucosamine 1500 Complex Caps ?Take 2 capsules by mouth daily. ?  ?HYDROcodone-acetaminophen 5-325 MG tablet ?Commonly known as: NORCO/VICODIN ?Take 1-2 tablets by mouth every 6 (six) hours as needed for moderate pain. ?  ?insulin lispro 100 UNIT/ML injection ?Commonly known as: HumaLOG ?INJECT SUBCUTANEOUSLY 20 TO 50 UNITS 3 TIMES DAILY  BEFORE MEALS PER SLIDING  SCALE ?What changed:  ?how much to take ?how to take this ?when to take this ?additional instructions ?  ?magnesium oxide 400 MG tablet ?Commonly known as: MAG-OX ?Take 400 mg by mouth daily. ?  ?Microlet Lancets Misc ?Test BS QID and as needed Dx E11.9 ?  ?montelukast 10 MG tablet ?Commonly known as: SINGULAIR ?TAKE 1 TABLET BY MOUTH AT  BEDTIME ?  ?multivitamin-lutein Caps capsule ?Take 1 capsule by mouth daily. ?  ?OneTouch Ultra test strip ?Generic drug: glucose blood ?Use as needed E11.9 ?  ?pantoprazole 40 MG tablet ?Commonly known as: PROTONIX ?Take 1 tablet (40 mg total) by mouth 2 (two) times daily before a meal. ?  ?Testosterone 30  MG/ACT Soln ?APPLY ONE PUMP TOPICALLY  UNDER EACH AXILLA DAILY  (TOTAL OF 60MG DAILY ) ?  ?Toujeo Max SoloStar 300 UNIT/ML Solostar Pen ?Generic drug: insulin glargine (2 Unit Dial) ?INJECT SUBCUTANEOUSLY 110  UNITS DAILY ?What changed: See the new instructions. ?  ?Turmeric Curcumin 500 MG Caps ?Take 500 mg by mouth daily. ?  ?valsartan 160 MG tablet ?Commonly known as: DIOVAN ?TAKE 1 TABLET BY MOUTH  DAILY ?What changed: how much to take ?  ?Vitamin D3 50 MCG (2000 UT) capsule ?Take 2,000 Units by mouth daily. ?  ? ?  ? ? ?Signature:  ?Chesley Mires, MD ?[place] ?Pager - [phone] - [phone] ?11/02/2021, 5:10 PM ?  ? ? ? ? ? ? ? ? ?

## 2021-11-02 NOTE — Patient Instructions (Signed)
Follow-up in 4 weeks

## 2021-11-03 ENCOUNTER — Ambulatory Visit (INDEPENDENT_AMBULATORY_CARE_PROVIDER_SITE_OTHER): Payer: BC Managed Care – PPO | Admitting: Family Medicine

## 2021-11-03 ENCOUNTER — Encounter: Payer: Self-pay | Admitting: Family Medicine

## 2021-11-03 VITALS — BP 97/50 | HR 69 | Temp 99.1°F | Ht 68.0 in | Wt 231.5 lb

## 2021-11-03 DIAGNOSIS — F439 Reaction to severe stress, unspecified: Secondary | ICD-10-CM | POA: Diagnosis not present

## 2021-11-03 DIAGNOSIS — G4733 Obstructive sleep apnea (adult) (pediatric): Secondary | ICD-10-CM

## 2021-11-03 DIAGNOSIS — Z79891 Long term (current) use of opiate analgesic: Secondary | ICD-10-CM | POA: Diagnosis not present

## 2021-11-03 DIAGNOSIS — E349 Endocrine disorder, unspecified: Secondary | ICD-10-CM

## 2021-11-03 DIAGNOSIS — I9589 Other hypotension: Secondary | ICD-10-CM

## 2021-11-03 DIAGNOSIS — N182 Chronic kidney disease, stage 2 (mild): Secondary | ICD-10-CM | POA: Diagnosis not present

## 2021-11-03 DIAGNOSIS — Z794 Long term (current) use of insulin: Secondary | ICD-10-CM | POA: Diagnosis not present

## 2021-11-03 DIAGNOSIS — Z79899 Other long term (current) drug therapy: Secondary | ICD-10-CM

## 2021-11-03 DIAGNOSIS — E1122 Type 2 diabetes mellitus with diabetic chronic kidney disease: Secondary | ICD-10-CM | POA: Diagnosis not present

## 2021-11-03 NOTE — Progress Notes (Signed)
Subjective: CC: Interval checkup PCP: [name], DO ZOX:[id number] W Tony Cantu is a 71 y.o. male presenting to clinic today for:  1.  OSA Patient had the inspire device placed.  He is for the first time last evening.  He is not sure how well he is going to do this because it so newly placed.  He is very optimistic however.  2.  Testosterone deficiency.   He is compliant with his testosterone topically.  No concerns at this time  3.  Stress Continues to have quite a bit of marital stress.  He felt neglected after his surgery for the obstructive sleep apnea.  He feels himself going more distant from her   ROS: Per HPI  Allergies  Allergen Reactions   Erythromycin Diarrhea   Sulfonamide Derivatives Diarrhea   Past Medical History:  Diagnosis Date   Aortic stenosis    Arthritis    Asthma    BPH (benign prostatic hypertrophy)    Cirrhosis (HCC) 2016   Colon polyps    Diabetes mellitus without complication (HCC)    TYPE 1    Diverticulosis    Dysrhythmia 04/19/2021   A-fib noted with possible A-fib RVR   Gastric ulcer    Gastric varices    right   Gastritis    GERD (gastroesophageal reflux disease)    Headache    History- pt states these were migraines that occurred in the 1980's   History of kidney stones    Hypertension    PONV (postoperative nausea and vomiting)    pt states he has never had post op nausea or vomiting   Renal cyst, right    Seizures (HCC)    HYPOGLYCEMIC LAST 1 AND 1/2 YRS AGO   Sleep apnea    uses CPAP nightly   Testicle trouble    one testicle BORN WITH   Tubular adenoma of colon     Current Outpatient Medications:    apixaban (ELIQUIS) 5 MG TABS tablet, Take 1 tablet (5 mg total) by mouth 2 (two) times daily., Disp: 180 tablet, Rfl: 1   atorvastatin (LIPITOR) 40 MG tablet, TAKE 1 TABLET BY MOUTH  DAILY, Disp: 90 tablet, Rfl: 1   b complex vitamins tablet, Take 1 tablet by mouth daily., Disp: , Rfl:    Blood Glucose Monitoring Suppl  (CONTOUR NEXT ONE) KIT, Test BS QID and as needed Dx E11.9, Disp: 500 kit, Rfl: 3   Cholecalciferol (VITAMIN D3) 2000 UNITS capsule, Take 2,000 Units by mouth daily., Disp: , Rfl:    Coenzyme Q10 (COQ10) 200 MG CAPS, Take 200 mg by mouth daily., Disp: , Rfl:    Continuous Blood Gluc Receiver (FREESTYLE LIBRE READER) DEVI, 1 applicator by Does not apply route as directed., Disp: 1 Device, Rfl: 1   Continuous Blood Gluc Sensor (FREESTYLE LIBRE SENSOR SYSTEM) MISC, Check BS eight (8) times a day. Dx E10.9, Disp: 3 each, Rfl: 3   diltiazem (CARDIZEM) 30 MG tablet, Take 1 tablet (30 mg total) by mouth 2 (two) times daily., Disp: 180 tablet, Rfl: 3   furosemide (LASIX) 20 MG tablet, TAKE 1 AND 1/2 TABLETS BY  MOUTH DAILY, Disp: 135 tablet, Rfl: 1   Glucagon, rDNA, (GLUCAGON EMERGENCY) 1 MG KIT, INJECT AS DIRECTED INTO  UPPER ARM, THIGH OR  BUTTOCKS AS NEEDED FOR  SEVERE HYPOGLYCEMIA. SEEK  MEDICAL ATTENTION AFTER USE, Disp: 3 kit, Rfl: 0   Glucosamine-Chondroit-Vit C-Mn (GLUCOSAMINE 1500 COMPLEX) CAPS, Take 2 capsules by mouth daily., Disp: ,  Rfl:    glucose blood (ONETOUCH ULTRA) test strip, Use as needed E11.9, Disp: 200 each, Rfl: 12   insulin lispro (HUMALOG) 100 UNIT/ML injection, INJECT SUBCUTANEOUSLY 20 TO 50 UNITS 3 TIMES DAILY  BEFORE MEALS PER SLIDING  SCALE (Patient taking differently: Inject 20-32 Units into the skin 3 (three) times daily with meals.), Disp: 140 mL, Rfl: 0   magnesium oxide (MAG-OX) 400 MG tablet, Take 400 mg by mouth daily., Disp: , Rfl:    Microlet Lancets MISC, Test BS QID and as needed Dx E11.9, Disp: 500 each, Rfl: 3   montelukast (SINGULAIR) 10 MG tablet, TAKE 1 TABLET BY MOUTH AT  BEDTIME, Disp: 90 tablet, Rfl: 3   multivitamin-lutein (OCUVITE-LUTEIN) CAPS capsule, Take 1 capsule by mouth daily., Disp: , Rfl:    pantoprazole (PROTONIX) 40 MG tablet, Take 1 tablet (40 mg total) by mouth 2 (two) times daily before a meal., Disp: 180 tablet, Rfl: 3   Testosterone 30 MG/ACT  SOLN, APPLY ONE PUMP TOPICALLY  UNDER EACH AXILLA DAILY  (TOTAL OF 60MG  DAILY ), Disp: 270 mL, Rfl: 1   TOUJEO MAX SOLOSTAR 300 UNIT/ML Solostar Pen, INJECT SUBCUTANEOUSLY 110  UNITS DAILY (Patient taking differently: Inject 90 Units into the skin daily.), Disp: 36 mL, Rfl: 0   Turmeric Curcumin 500 MG CAPS, Take 500 mg by mouth daily., Disp: , Rfl:    valsartan (DIOVAN) 160 MG tablet, TAKE 1 TABLET BY MOUTH  DAILY (Patient taking differently: Take 80 mg by mouth daily.), Disp: 90 tablet, Rfl: 1   albuterol (VENTOLIN HFA) 108 (90 Base) MCG/ACT inhaler, Inhale 2 puffs into the lungs every 6 (six) hours as needed for wheezing or shortness of breath. (Patient not taking: Reported on 11/03/2021), Disp: 6.7 g, Rfl: 0   HYDROcodone-acetaminophen (NORCO/VICODIN) 5-325 MG tablet, Take 1-2 tablets by mouth every 6 (six) hours as needed for moderate pain. (Patient not taking: Reported on 11/03/2021), Disp: 12 tablet, Rfl: 0 Social History   Socioeconomic History   Marital status: Married    Spouse name: Not on file   Number of children: 1   Years of education: Not on file   Highest education level: Not on file  Occupational History   Occupation: retired   Tobacco Use   Smoking status: Former    Years: 34.00    Types: Cigarettes, Pipe    Quit date: 2005    Years since quitting: 18.1   Smokeless tobacco: Never   Tobacco comments:    quit 2005 smoked cigarettes for 5 yrs prior to pipe use  Vaping Use   Vaping Use: Never used  Substance and Sexual Activity   Alcohol use: Not Currently   Drug use: No   Sexual activity: Yes  Other Topics Concern   Not on file  Social History Narrative   ** Merged History Encounter **       Social Determinants of Health   Financial Resource Strain: Not on file  Food Insecurity: Not on file  Transportation Needs: Not on file  Physical Activity: Not on file  Stress: Not on file  Social Connections: Not on file  Intimate Partner Violence: Not on file   Family  History  Problem Relation Age of Onset   Heart failure Mother    Heart attack Mother        in her 38's   Alzheimer's disease Mother    Diabetes Mother    Asthma Father    Suicidality Father  in his 74's   Allergic rhinitis Sister    Heart failure Maternal Grandmother    Rheum arthritis Maternal Grandmother    Breast cancer Maternal Grandmother    Heart attack Maternal Grandmother        in her 102's   Colon cancer Neg Hx    Esophageal cancer Neg Hx    Pancreatic cancer Neg Hx    Liver disease Neg Hx     Objective: Office vital signs reviewed. BP (!) 97/50   Pulse 69   Temp 99.1 F (37.3 C) (Temporal)   Ht 5\' 8"  (1.727 m)   Wt 231 lb 8 oz (105 kg)   BMI 35.20 kg/m   Physical Examination:  General: Awake, alert, nontoxic male HEENT: Sclera white.  Moist mucous membranes Cardio: regular rate and rhythm, S1S2 heard, no murmurs appreciated Pulm: clear to auscultation bilaterally, no wheezes, rhonchi or rales; normal work of breathing on room air  Assessment/ Plan: 71 y.o. male   Testosterone deficiency - Plan: ToxASSURE Select 13 (MW), Urine, CBC, Testosterone  Controlled substance agreement signed - Plan: Testosterone  OSA (obstructive sleep apnea)  Other specified hypotension  Stress at home  Check a.m. testosterone, CBC.  UDS and CSC were updated as per office policy.  He is currently up-to-date on testosterone Rx but will be needing a new prescription in about 4 months.  We will plan to send that over when he is due  He is healing well from an inspire device implantation.  He used it for the first time last evening so is too soon to tell with his skin to make a huge difference yet but he is very optimistic  Blood pressure is on the low side.  He is on a half dose of the Diovan but we may need to consider backing him down even further.  I would like him to monitor blood pressures very closely.  If blood pressures remain below 110/60 and anticipate we will  need to take him off of the ARB totally  Encouraged him to follow-up with me as needed for the stress that he has ongoing at home.  Follow-up at appropriate intervals for diabetes checkup  Orders Placed This Encounter  Procedures   ToxASSURE Select 13 (MW), Urine   CBC   Testosterone   No orders of the defined types were placed in this encounter.    [name], DO [facility] [phone]

## 2021-11-04 LAB — CBC
Hematocrit: 49.9 % (ref 37.5–51.0)
Hemoglobin: 17.1 g/dL (ref 13.0–17.7)
MCH: 34.5 pg — ABNORMAL HIGH (ref 26.6–33.0)
MCHC: 34.3 g/dL (ref 31.5–35.7)
MCV: 101 fL — ABNORMAL HIGH (ref 79–97)
Platelets: 100 10*3/uL — CL (ref 150–450)
RBC: 4.95 x10E6/uL (ref 4.14–5.80)
RDW: 12.7 % (ref 11.6–15.4)
WBC: 7.2 10*3/uL (ref 3.4–10.8)

## 2021-11-04 LAB — TESTOSTERONE: Testosterone: 635 ng/dL (ref 264–916)

## 2021-11-06 LAB — TOXASSURE SELECT 13 (MW), URINE

## 2021-11-07 ENCOUNTER — Other Ambulatory Visit: Payer: Self-pay | Admitting: Family Medicine

## 2021-11-07 DIAGNOSIS — E1122 Type 2 diabetes mellitus with diabetic chronic kidney disease: Secondary | ICD-10-CM

## 2021-11-07 DIAGNOSIS — N182 Chronic kidney disease, stage 2 (mild): Secondary | ICD-10-CM

## 2021-11-08 ENCOUNTER — Other Ambulatory Visit: Payer: Self-pay | Admitting: Family Medicine

## 2021-11-08 DIAGNOSIS — D696 Thrombocytopenia, unspecified: Secondary | ICD-10-CM

## 2021-11-12 ENCOUNTER — Telehealth: Payer: Self-pay | Admitting: Family Medicine

## 2021-11-12 NOTE — Telephone Encounter (Signed)
Ref# [id number] ?

## 2021-11-12 NOTE — Telephone Encounter (Signed)
Clarification given to [facility].  ?

## 2021-11-12 NOTE — Telephone Encounter (Signed)
[facility] on phone says they got confirmation fax from dr g about Tony Cantu [birth year] Humalog clarification ? ?wants to know how many units per day  ? ?pt says they take 35 units before meals and 20 units with each snack and they have 3 snacks per day which would equal 135 units per day ? ? ?

## 2021-11-13 ENCOUNTER — Encounter: Payer: Self-pay | Admitting: Family Medicine

## 2021-11-17 ENCOUNTER — Encounter: Payer: Self-pay | Admitting: Neurology

## 2021-11-29 ENCOUNTER — Ambulatory Visit (INDEPENDENT_AMBULATORY_CARE_PROVIDER_SITE_OTHER): Payer: BC Managed Care – PPO | Admitting: Neurology

## 2021-11-29 ENCOUNTER — Encounter: Payer: Self-pay | Admitting: Neurology

## 2021-11-29 VITALS — BP 110/65 | HR 62 | Ht 68.5 in | Wt 230.4 lb

## 2021-11-29 DIAGNOSIS — R4789 Other speech disturbances: Secondary | ICD-10-CM | POA: Diagnosis not present

## 2021-11-29 NOTE — Patient Instructions (Signed)
Good to meet you. Let me speak to Dr. Redmond Baseman about any further tests. For now, we will do a carotid ultrasound in [place]. Follow-up as needed, call for any changes ?

## 2021-11-29 NOTE — Progress Notes (Signed)
? ?NEUROLOGY CONSULTATION NOTE ? ?Tony Cantu ?MRN: [id number] ?DOB: 1951-07-12 ? ?Referring provider: Dr. Melida Quitter ?Primary care provider: Dr. Ronnie Doss ? ?Reason for consult:  speech changes, ?TIA ? ?Dear Dr Redmond Baseman: ? ?Thank you for your kind referral of Tony Cantu for consultation of the above symptoms. Although his history is well known to you, please allow me to reiterate it for the purpose of our medical record. He is alone in the office today. Records and images were personally reviewed where available. ? ? ?HISTORY OF PRESENT ILLNESS: ?This is a pleasant 71 year old right-handed man with a history of hypertension, hyperlipidemia, DM, paroxysmal atrial fibrillation on Eliquis, OSA s/p hypoglossal nerve stimulator, presenting for evaluation of possible TIAs. He states that after the Inspire device was implanted on 10/01/21, he was told he may experience some speech changes but did not have any in 1.5 months until last earlier last month when he had 3 episodes in 1 week where it felt like there was a ball in the base of his neck, and then he talked, part of his pronunciation was not coming out right. He was aware of it and denied any word-finding difficulties. Symptoms would clear up in 30 seconds. One time he was talking to his ex-wife and when he alerted her, she said the only thing she noticed is he sounded hoarse. The last episode occurred 3-4 weeks ago as he was saying bye to his wife and she said she did not hear anything different in his voice/speech. During these times, the stimulator has been off. He turns it on at night and turns it off when he wakes up. Over the past 1.5 weeks, he has been waking up in the morning feeling like phlegm has collected in the bottom of his throat, he has tried to cough up the phlegm and takes Mucinex, which sometimes helps. He did not notice the sensation yesterday and this morning. He denies any focal numbness/tingling/weakness, headache, dizziness, vision  changes. He has balance problems that he attributes to his knees that need replacement. He had a fall one day when he was hypoglycemic, his right knee gave way and he ended up on the floor, no loss of consciousness. He has checked his glucose levels during the episodes with no significant abnormalities. He denies missing any doses of Eliquis. He reports that he was unaware Eliquis had to be stopped a few days after the procedure in Feb, he took it that evening then started having a dark lump in his neck, he was told to stop Eliquis. He states there is still a small place on the right side of his neck but it is smaller now. He has been on Eliquis daily for 2 months now.  ? ?Last soft tissue neck imaging in 09/2021 showed right-sided stimulator lead projects within the soft tissues at the right aspect of the neck. ? ?PAST MEDICAL HISTORY: ?Past Medical History:  ?Diagnosis Date  ? Aortic stenosis   ? Arthritis   ? Asthma   ? BPH (benign prostatic hypertrophy)   ? Cirrhosis ([place]) 2016  ? Colon polyps   ? Diabetes mellitus without complication ([place])   ? TYPE 1   ? Diverticulosis   ? Dysrhythmia 04/19/2021  ? A-fib noted with possible A-fib RVR  ? Gastric ulcer   ? Gastric varices   ? right  ? Gastritis   ? GERD (gastroesophageal reflux disease)   ? Headache   ? History- pt states these were  migraines that occurred in the 1980's  ? History of kidney stones   ? Hypertension   ? PONV (postoperative nausea and vomiting)   ? pt states he has never had post op nausea or vomiting  ? Renal cyst, right   ? Seizures ([place])   ? HYPOGLYCEMIC LAST 1 AND 1/2 YRS AGO  ? Sleep apnea   ? uses CPAP nightly  ? Testicle trouble   ? one testicle BORN WITH  ? Tubular adenoma of colon   ? ? ?PAST SURGICAL HISTORY: ?Past Surgical History:  ?Procedure Laterality Date  ? BACK SURGERY    ? 89  LOWER   ? CARPAL TUNNEL RELEASE Right 10/13/2015  ? Procedure: RIGHT CARPAL TUNNEL RELEASE;  Surgeon: Daryll Brod, MD;  Location: [facility];   Service: Orthopedics;  Laterality: Right;  ? CARPAL TUNNEL RELEASE Left 07/05/2016  ? Procedure: LEFT CARPAL TUNNEL RELEASE;  Surgeon: Daryll Brod, MD;  Location: [facility];  Service: Orthopedics;  Laterality: Left;  ? DRUG INDUCED ENDOSCOPY N/A 07/02/2021  ? Procedure: DRUG INDUCED SLEEP ENDOSCOPY;  Surgeon: Melida Quitter, MD;  Location: [facility];  Service: ENT;  Laterality: N/A;  ? IMPLANTATION OF HYPOGLOSSAL NERVE STIMULATOR Right 10/01/2021  ? Procedure: IMPLANTATION OF HYPOGLOSSAL NERVE STIMULATOR;  Surgeon: Melida Quitter, MD;  Location: [facility];  Service: ENT;  Laterality: Right;  ? INGUINAL HERNIA REPAIR  2003  ? right   ? KNEE ARTHROSCOPY Right 03/03/2016  ? [place]  03/1995  ? Dr. Coralyn Mark, discectomy  ? LUMBAR LAMINECTOMY/DECOMPRESSION MICRODISCECTOMY Right 10/06/2016  ? Procedure: RIGHT LUMBAR THREE - LUMBAR FOUR  LAMINECTOMY, FORAMINOTOMY AND MICRODISCECTOMY;  Surgeon: Jovita Gamma, MD;  Location: [facility];  Service: Neurosurgery;  Laterality: Right;  ? SHOULDER SURGERY  11/28/2005  ? left partial  ? SHOULDER SURGERY  07/14/2006  ? RIGHT  ? TONSILLECTOMY  AGE 41 OR 4  ? TOTAL SHOULDER ARTHROPLASTY Right 11/01/2018  ? Procedure: RIGHT SHOULDER REVISION TO REVERSE TOTAL SHOULDER;  Surgeon: Tania Ade, MD;  Location: [facility];  Service: Orthopedics;  Laterality: Right;  CHOICE ANESTHESIA WITH INTERSCALENE BLOCK EXPAREL, NEEDS RNFA  ? ? ?MEDICATIONS: ?Current Outpatient Medications on File Prior to Visit  ?Medication Sig Dispense Refill  ? apixaban (ELIQUIS) 5 MG TABS tablet Take 1 tablet (5 mg total) by mouth 2 (two) times daily. 180 tablet 1  ? atorvastatin (LIPITOR) 40 MG tablet TAKE 1 TABLET BY MOUTH  DAILY 90 tablet 1  ? b complex vitamins tablet Take 1 tablet by mouth daily.    ? Blood Glucose Monitoring Suppl (CONTOUR NEXT ONE) KIT Test BS QID and as needed Dx E11.9 500 kit 3  ? Cholecalciferol (VITAMIN D3) 2000 UNITS capsule Take 2,000 Units by mouth daily.    ? Coenzyme Q10  (COQ10) 200 MG CAPS Take 200 mg by mouth daily.    ? Continuous Blood Gluc Receiver (FREESTYLE LIBRE READER) DEVI 1 applicator by Does not apply route as directed. 1 Device 1  ? Continuous Blood Gluc Sensor (FREESTYLE LIBRE SENSOR SYSTEM) MISC Check BS eight (8) times a day. Dx E10.9 3 each 3  ? diltiazem (CARDIZEM) 30 MG tablet Take 1 tablet (30 mg total) by mouth 2 (two) times daily. 180 tablet 3  ? furosemide (LASIX) 20 MG tablet TAKE 1 AND 1/2 TABLETS BY  MOUTH DAILY 135 tablet 1  ? Glucagon, rDNA, (GLUCAGON EMERGENCY) 1 MG KIT INJECT AS DIRECTED INTO  UPPER ARM, THIGH OR  BUTTOCKS AS  NEEDED FOR  SEVERE HYPOGLYCEMIA. SEEK  MEDICAL ATTENTION AFTER USE 3 kit 0  ? Glucosamine-Chondroit-Vit C-Mn (GLUCOSAMINE 1500 COMPLEX) CAPS Take 2 capsules by mouth daily.    ? glucose blood (CONTOUR NEXT TEST) test strip Test BS QID and as needed Dx E11.9 400 strip 3  ? insulin lispro (HUMALOG) 100 UNIT/ML injection INJECT SUBCUTANEOUSLY 20 TO 50 UNITS 3 TIMES DAILY  BEFORE MEALS PER SLIDING  SCALE 140 mL 0  ? magnesium oxide (MAG-OX) 400 MG tablet Take 400 mg by mouth daily.    ? Microlet Lancets MISC Test BS QID and as needed Dx E11.9 500 each 3  ? montelukast (SINGULAIR) 10 MG tablet TAKE 1 TABLET BY MOUTH AT  BEDTIME 90 tablet 3  ? multivitamin-lutein (OCUVITE-LUTEIN) CAPS capsule Take 1 capsule by mouth daily.    ? pantoprazole (PROTONIX) 40 MG tablet Take 1 tablet (40 mg total) by mouth 2 (two) times daily before a meal. 180 tablet 3  ? Testosterone 30 MG/ACT SOLN APPLY ONE PUMP TOPICALLY  UNDER EACH AXILLA DAILY  (TOTAL OF 60MG DAILY ) 270 mL 1  ? TOUJEO MAX SOLOSTAR 300 UNIT/ML Solostar Pen INJECT SUBCUTANEOUSLY 110  UNITS DAILY (Patient taking differently: Inject 80 Units into the skin daily.) 36 mL 0  ? Turmeric Curcumin 500 MG CAPS Take 500 mg by mouth daily.    ? valsartan (DIOVAN) 160 MG tablet TAKE 1 TABLET BY MOUTH  DAILY (Patient taking differently: Take 80 mg by mouth daily.) 90 tablet 1  ? albuterol (VENTOLIN  HFA) 108 (90 Base) MCG/ACT inhaler Inhale 2 puffs into the lungs every 6 (six) hours as needed for wheezing or shortness of breath. (Patient not taking: Reported on 11/03/2021) 6.7 g 0  ? ?No current facili

## 2021-12-03 ENCOUNTER — Ambulatory Visit (HOSPITAL_COMMUNITY)
Admission: RE | Admit: 2021-12-03 | Discharge: 2021-12-03 | Disposition: A | Discharge status: Home / Self Care | Payer: BC Managed Care – PPO | Source: Ambulatory Visit | Attending: Neurology | Admitting: Neurology

## 2021-12-03 DIAGNOSIS — R4789 Other speech disturbances: Secondary | ICD-10-CM | POA: Diagnosis not present

## 2021-12-03 DIAGNOSIS — I6523 Occlusion and stenosis of bilateral carotid arteries: Secondary | ICD-10-CM | POA: Diagnosis not present

## 2021-12-03 DIAGNOSIS — Z794 Long term (current) use of insulin: Secondary | ICD-10-CM | POA: Diagnosis not present

## 2021-12-03 DIAGNOSIS — N182 Chronic kidney disease, stage 2 (mild): Secondary | ICD-10-CM | POA: Diagnosis not present

## 2021-12-03 DIAGNOSIS — E1122 Type 2 diabetes mellitus with diabetic chronic kidney disease: Secondary | ICD-10-CM | POA: Diagnosis not present

## 2021-12-03 DIAGNOSIS — R479 Unspecified speech disturbances: Secondary | ICD-10-CM | POA: Diagnosis not present

## 2021-12-10 ENCOUNTER — Encounter: Payer: Self-pay | Admitting: Family Medicine

## 2021-12-10 ENCOUNTER — Inpatient Hospital Stay (HOSPITAL_COMMUNITY): Payer: BC Managed Care – PPO

## 2021-12-10 ENCOUNTER — Inpatient Hospital Stay (HOSPITAL_COMMUNITY): Payer: BC Managed Care – PPO | Attending: Hematology | Admitting: Hematology

## 2021-12-10 VITALS — BP 114/71 | HR 67 | Temp 98.3°F | Resp 18 | Ht 68.5 in | Wt 231.5 lb

## 2021-12-10 DIAGNOSIS — I10 Essential (primary) hypertension: Secondary | ICD-10-CM | POA: Insufficient documentation

## 2021-12-10 DIAGNOSIS — D696 Thrombocytopenia, unspecified: Secondary | ICD-10-CM | POA: Insufficient documentation

## 2021-12-10 DIAGNOSIS — Z87891 Personal history of nicotine dependence: Secondary | ICD-10-CM | POA: Diagnosis not present

## 2021-12-10 DIAGNOSIS — R197 Diarrhea, unspecified: Secondary | ICD-10-CM | POA: Diagnosis not present

## 2021-12-10 DIAGNOSIS — Z8719 Personal history of other diseases of the digestive system: Secondary | ICD-10-CM | POA: Diagnosis not present

## 2021-12-10 DIAGNOSIS — E119 Type 2 diabetes mellitus without complications: Secondary | ICD-10-CM | POA: Diagnosis not present

## 2021-12-10 DIAGNOSIS — Z803 Family history of malignant neoplasm of breast: Secondary | ICD-10-CM | POA: Insufficient documentation

## 2021-12-10 DIAGNOSIS — Z836 Family history of other diseases of the respiratory system: Secondary | ICD-10-CM | POA: Insufficient documentation

## 2021-12-10 DIAGNOSIS — Z881 Allergy status to other antibiotic agents status: Secondary | ICD-10-CM | POA: Insufficient documentation

## 2021-12-10 DIAGNOSIS — Z87442 Personal history of urinary calculi: Secondary | ICD-10-CM | POA: Diagnosis not present

## 2021-12-10 DIAGNOSIS — Z79899 Other long term (current) drug therapy: Secondary | ICD-10-CM | POA: Diagnosis not present

## 2021-12-10 DIAGNOSIS — Z833 Family history of diabetes mellitus: Secondary | ICD-10-CM | POA: Diagnosis not present

## 2021-12-10 DIAGNOSIS — Z882 Allergy status to sulfonamides status: Secondary | ICD-10-CM | POA: Insufficient documentation

## 2021-12-10 DIAGNOSIS — Z8261 Family history of arthritis: Secondary | ICD-10-CM | POA: Diagnosis not present

## 2021-12-10 DIAGNOSIS — G4730 Sleep apnea, unspecified: Secondary | ICD-10-CM | POA: Diagnosis not present

## 2021-12-10 DIAGNOSIS — Z818 Family history of other mental and behavioral disorders: Secondary | ICD-10-CM | POA: Insufficient documentation

## 2021-12-10 DIAGNOSIS — I480 Paroxysmal atrial fibrillation: Secondary | ICD-10-CM | POA: Insufficient documentation

## 2021-12-10 DIAGNOSIS — Z8249 Family history of ischemic heart disease and other diseases of the circulatory system: Secondary | ICD-10-CM | POA: Insufficient documentation

## 2021-12-10 DIAGNOSIS — Z7901 Long term (current) use of anticoagulants: Secondary | ICD-10-CM | POA: Diagnosis not present

## 2021-12-10 LAB — CBC WITH DIFFERENTIAL/PLATELET
Abs Immature Granulocytes: 0.08 10*3/uL — ABNORMAL HIGH (ref 0.00–0.07)
Basophils Absolute: 0 10*3/uL (ref 0.0–0.1)
Basophils Relative: 1 %
Eosinophils Absolute: 0.2 10*3/uL (ref 0.0–0.5)
Eosinophils Relative: 2 %
HCT: 49.7 % (ref 39.0–52.0)
Hemoglobin: 16.6 g/dL (ref 13.0–17.0)
Immature Granulocytes: 1 %
Lymphocytes Relative: 20 %
Lymphs Abs: 1.5 10*3/uL (ref 0.7–4.0)
MCH: 34.3 pg — ABNORMAL HIGH (ref 26.0–34.0)
MCHC: 33.4 g/dL (ref 30.0–36.0)
MCV: 102.7 fL — ABNORMAL HIGH (ref 80.0–100.0)
Monocytes Absolute: 1.4 10*3/uL — ABNORMAL HIGH (ref 0.1–1.0)
Monocytes Relative: 19 %
Neutro Abs: 4.1 10*3/uL (ref 1.7–7.7)
Neutrophils Relative %: 57 %
Platelets: 107 10*3/uL — ABNORMAL LOW (ref 150–400)
RBC: 4.84 MIL/uL (ref 4.22–5.81)
RDW: 14.3 % (ref 11.5–15.5)
WBC: 7.2 10*3/uL (ref 4.0–10.5)
nRBC: 0 % (ref 0.0–0.2)

## 2021-12-10 LAB — RETICULOCYTES
Immature Retic Fract: 12.6 % (ref 2.3–15.9)
RBC.: 4.72 MIL/uL (ref 4.22–5.81)
Retic Count, Absolute: 69.9 10*3/uL (ref 19.0–186.0)
Retic Ct Pct: 1.5 % (ref 0.4–3.1)

## 2021-12-10 LAB — HEPATITIS B CORE ANTIBODY, TOTAL: Hep B Core Total Ab: NONREACTIVE

## 2021-12-10 LAB — HEPATITIS B SURFACE ANTIGEN: Hepatitis B Surface Ag: NONREACTIVE

## 2021-12-10 LAB — HEPATITIS B SURFACE ANTIBODY,QUALITATIVE: Hep B S Ab: NONREACTIVE

## 2021-12-10 LAB — FOLATE: Folate: 23.1 ng/mL (ref >5.9)

## 2021-12-10 LAB — VITAMIN B12: Vitamin B-12: 746 pg/mL (ref 180–914)

## 2021-12-10 LAB — HEPATITIS C ANTIBODY: HCV Ab: NONREACTIVE

## 2021-12-10 LAB — LACTATE DEHYDROGENASE: LDH: 189 U/L (ref 98–192)

## 2021-12-10 NOTE — Progress Notes (Signed)
? ?[facility] ?[address] ?[place], [place] [address] ? ? ?CLINIC:  ?Medical Oncology/Hematology ? ?Patient Care Team: ?[name], DO as PCP - General (Family Medicine) ?Minus Breeding, MD as PCP - Cardiology (Cardiology) ?Larey Dresser, MD (Cardiology) ?Kathee Delton, MD (Pulmonary Disease) ?Teena Irani, MD (Inactive) (Gastroenterology) ?Patient, No Pcp Per (Inactive) (General Practice) ?Cameron Sprang, MD as Consulting Physician (Neurology) ?Derek Jack, MD as Medical Oncologist (Hematology) ? ?CHIEF COMPLAINTS/PURPOSE OF CONSULTATION:  ?Evaluation of thrombocytopenia ? ?HISTORY OF PRESENTING ILLNESS:  ?Tony Cantu 71 y.o. male is here because of evaluation of thrombocytopenia, at the request of [facility]. ? ?Today he reports feeling well. 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 He is currently on Eliquis which he started in January 2023. He denies easy bleeding/bruising, hematochezia, nosebleeds, hematuria, fevers, night sweats, and weight loss. He denies history of blood transfusions. He reports occasional diarrhea. He denies history of RA, lupus, and ulcerative colitis.  ? ?Prior to retirement he worked for [employer]. He denies chemical exposure. He quit smoking in 2005, and he smoked a pipe for 35 years. He denies family history of thrombocytopenia. His maternal grandmother had breast cancer, and his mother "had a foot of her intestines removed" but he is unsure if it was cancer.  ? ?MEDICAL HISTORY:  ?Past Medical History:  ?Diagnosis Date  ? Aortic stenosis   ? Arthritis   ? Asthma   ? BPH (benign prostatic hypertrophy)   ? Cirrhosis ([facility] [facility]) 2016  ? Colon polyps   ? Diabetes mellitus without complication ([facility])   ? TYPE 1   ? Diverticulosis   ? Dysrhythmia 04/19/2021  ? A-fib noted with possible A-fib RVR  ? Gastric ulcer   ? Gastric varices   ? right  ? Gastritis   ? GERD (gastroesophageal reflux disease)   ? Headache   ? History- pt  states these were migraines that occurred in the 1980's  ? History of kidney stones   ? Hypertension   ? PONV (postoperative nausea and vomiting)   ? pt states he has never had post op nausea or vomiting  ? Renal cyst, right   ? Seizures ([facility])   ? HYPOGLYCEMIC LAST 1 AND 1/2 YRS AGO  ? Sleep apnea   ? uses CPAP nightly  ? Testicle trouble   ? one testicle BORN WITH  ? Tubular adenoma of colon   ? ? ?SURGICAL HISTORY: ?Past Surgical History:  ?Procedure Laterality Date  ? BACK SURGERY    ? 89  LOWER   ? CARPAL TUNNEL RELEASE Right 10/13/2015  ? Procedure: RIGHT CARPAL TUNNEL RELEASE;  Surgeon: Daryll Brod, MD;  Location: [facility];  Service: Orthopedics;  Laterality: Right;  ? CARPAL TUNNEL RELEASE Left 07/05/2016  ? Procedure: LEFT CARPAL TUNNEL RELEASE;  Surgeon: Daryll Brod, MD;  Location: [facility];  Service: Orthopedics;  Laterality: Left;  ? DRUG INDUCED ENDOSCOPY N/A 07/02/2021  ? Procedure: DRUG INDUCED SLEEP ENDOSCOPY;  Surgeon: Melida Quitter, MD;  Location: [facility];  Service: ENT;  Laterality: N/A;  ? IMPLANTATION OF HYPOGLOSSAL NERVE STIMULATOR Right 10/01/2021  ? Procedure: IMPLANTATION OF HYPOGLOSSAL NERVE STIMULATOR;  Surgeon: Melida Quitter, MD;  Location: [facility];  Service: ENT;  Laterality: Right;  ? INGUINAL HERNIA REPAIR  2003  ? right   ? KNEE ARTHROSCOPY Right 03/03/2016  ? [facility] SURGERY  03/1995  ? Dr. Coralyn Mark, discectomy  ?  LUMBAR LAMINECTOMY/DECOMPRESSION MICRODISCECTOMY Right 10/06/2016  ? Procedure: RIGHT LUMBAR THREE - LUMBAR FOUR  LAMINECTOMY, FORAMINOTOMY AND MICRODISCECTOMY;  Surgeon: Jovita Gamma, MD;  Location: [facility];  Service: Neurosurgery;  Laterality: Right;  ? SHOULDER SURGERY  11/28/2005  ? left partial  ? SHOULDER SURGERY  07/14/2006  ? RIGHT  ? TONSILLECTOMY  AGE 10 OR 4  ? TOTAL SHOULDER ARTHROPLASTY Right 11/01/2018  ? Procedure: RIGHT SHOULDER REVISION TO REVERSE TOTAL SHOULDER;  Surgeon: Tania Ade, MD;  Location: [facility];  Service:  Orthopedics;  Laterality: Right;  CHOICE ANESTHESIA WITH INTERSCALENE BLOCK EXPAREL, NEEDS RNFA  ? ? ?SOCIAL HISTORY: ?Social History  ? ?Socioeconomic History  ? Marital status: Married  ?  Spouse name: Not on file  ? Number of children: 1  ? Years of education: Not on file  ? Highest education level: Not on file  ?Occupational History  ? Occupation: retired   ?Tobacco Use  ? Smoking status: Former  ?  Years: 34.00  ?  Types: Cigarettes, Pipe  ?  Quit date: 2005  ?  Years since quitting: 18.2  ? Smokeless tobacco: Never  ? Tobacco comments:  ?  quit 2005 smoked cigarettes for 5 yrs prior to pipe use  ?Vaping Use  ? Vaping Use: Never used  ?Substance and Sexual Activity  ? Alcohol use: Not Currently  ? Drug use: No  ? Sexual activity: Yes  ?Other Topics Concern  ? Not on file  ?Social History Narrative  ? ** Merged History Encounter **   ? Right handed   ? ?Social Determinants of Health  ? ?Financial Resource Strain: Not on file  ?Food Insecurity: Not on file  ?Transportation Needs: Not on file  ?Physical Activity: Not on file  ?Stress: Not on file  ?Social Connections: Not on file  ?Intimate Partner Violence: Not on file  ? ? ?FAMILY HISTORY: ?Family History  ?Problem Relation Age of Onset  ? Heart failure Mother   ? Heart attack Mother   ?     in her 30's  ? Alzheimer's disease Mother   ? Diabetes Mother   ? Asthma Father   ? Suicidality Father   ?     in his 23's  ? Allergic rhinitis Sister   ? Heart failure Maternal Grandmother   ? Rheum arthritis Maternal Grandmother   ? Breast cancer Maternal Grandmother   ? Heart attack Maternal Grandmother   ?     in her 43's  ? Colon cancer Neg Hx   ? Esophageal cancer Neg Hx   ? Pancreatic cancer Neg Hx   ? Liver disease Neg Hx   ? ? ?ALLERGIES:  is allergic to erythromycin and sulfonamide derivatives. ? ?MEDICATIONS:  ?Current Outpatient Medications  ?Medication Sig Dispense Refill  ? albuterol (VENTOLIN HFA) 108 (90 Base) MCG/ACT inhaler Inhale 2 puffs into the lungs  every 6 (six) hours as needed for wheezing or shortness of breath. 6.7 g 0  ? apixaban (ELIQUIS) 5 MG TABS tablet Take 1 tablet (5 mg total) by mouth 2 (two) times daily. 180 tablet 1  ? atorvastatin (LIPITOR) 40 MG tablet TAKE 1 TABLET BY MOUTH  DAILY 90 tablet 1  ? b complex vitamins tablet Take 1 tablet by mouth daily.    ? Blood Glucose Monitoring Suppl (CONTOUR NEXT ONE) KIT Test BS QID and as needed Dx E11.9 500 kit 3  ? Cholecalciferol (VITAMIN D3) 2000 UNITS capsule Take 2,000 Units by mouth daily.    ?  Coenzyme Q10 (COQ10) 200 MG CAPS Take 200 mg by mouth daily.    ? Continuous Blood Gluc Receiver (FREESTYLE LIBRE READER) DEVI 1 applicator by Does not apply route as directed. 1 Device 1  ? Continuous Blood Gluc Sensor (FREESTYLE LIBRE SENSOR SYSTEM) MISC Check BS eight (8) times a day. Dx E10.9 3 each 3  ? diltiazem (CARDIZEM) 30 MG tablet Take 1 tablet (30 mg total) by mouth 2 (two) times daily. 180 tablet 3  ? furosemide (LASIX) 20 MG tablet TAKE 1 AND 1/2 TABLETS BY  MOUTH DAILY 135 tablet 1  ? Glucagon, rDNA, (GLUCAGON EMERGENCY) 1 MG KIT INJECT AS DIRECTED INTO  UPPER ARM, THIGH OR  BUTTOCKS AS NEEDED FOR  SEVERE HYPOGLYCEMIA. SEEK  MEDICAL ATTENTION AFTER USE 3 kit 0  ? Glucosamine-Chondroit-Vit C-Mn (GLUCOSAMINE 1500 COMPLEX) CAPS Take 2 capsules by mouth daily.    ? glucose blood (CONTOUR NEXT TEST) test strip Test BS QID and as needed Dx E11.9 400 strip 3  ? insulin lispro (HUMALOG) 100 UNIT/ML injection INJECT SUBCUTANEOUSLY 20 TO 50 UNITS 3 TIMES DAILY  BEFORE MEALS PER SLIDING  SCALE 140 mL 0  ? magnesium oxide (MAG-OX) 400 MG tablet Take 400 mg by mouth daily.    ? Microlet Lancets MISC Test BS QID and as needed Dx E11.9 500 each 3  ? montelukast (SINGULAIR) 10 MG tablet TAKE 1 TABLET BY MOUTH AT  BEDTIME 90 tablet 3  ? multivitamin-lutein (OCUVITE-LUTEIN) CAPS capsule Take 1 capsule by mouth daily.    ? pantoprazole (PROTONIX) 40 MG tablet Take 1 tablet (40 mg total) by mouth 2 (two) times  daily before a meal. 180 tablet 3  ? Testosterone 30 MG/ACT SOLN APPLY ONE PUMP TOPICALLY  UNDER EACH AXILLA DAILY  (TOTAL OF 60MG DAILY ) 270 mL 1  ? TOUJEO MAX SOLOSTAR 300 UNIT/ML Solostar Pen INJECT SUBCUT

## 2021-12-10 NOTE — Patient Instructions (Signed)
[facility] at [facility] ?Discharge Instructions ? ?You were seen and examined today by Dr. Delton Coombes. Dr. Delton Coombes is a hematologist, meaning that he specializes in blood abnormalities. Dr. Delton Coombes discussed your past medical history, family history of cancers/blood conditions and the events that led to you being here today. ? ?You were referred to Dr. Delton Coombes due to thrombocytopenia (decreased platelets). Your platelets have been decreased consistently since 2020 and off and on since 2017. Dr. Delton Coombes has recommended additional lab work today in an attempt to find the cause of your decreased platelets. ? ?Follow-up as scheduled. ? ? ?Thank you for choosing [facility] at [facility] to provide your oncology and hematology care.  To afford each patient quality time with our provider, please arrive at least 15 minutes before your scheduled appointment time.  ? ?If you have a lab appointment with the [facility] please come in thru the Main Entrance and check in at the main information desk. ? ?You need to re-schedule your appointment should you arrive 10 or more minutes late.  We strive to give you quality time with our providers, and arriving late affects you and other patients whose appointments are after yours.  Also, if you no show three or more times for appointments you may be dismissed from the clinic at the providers discretion.     ?Again, thank you for choosing [facility].  Our hope is that these requests will decrease the amount of time that you wait before being seen by our physicians.       ?_____________________________________________________________ ? ?Should you have questions after your visit to [facility], please contact our office at [phone] and follow the prompts.  Our office hours are 8:00 a.m. and 4:30 p.m. Monday - Friday.  Please note that voicemails left after 4:00 p.m. may not be returned until  the following business day.  We are closed weekends and major holidays.  You do have access to a nurse 24-7, just call the main number to the clinic [phone] and do not press any options, hold on the line and a nurse will answer the phone.   ? ?For prescription refill requests, have your pharmacy contact our office and allow 72 hours.   ? ?Due to Covid, you will need to wear a mask upon entering the hospital. If you do not have a mask, a mask will be given to you at the Main Entrance upon arrival. For doctor visits, patients may have 1 support person age 3 or older with them. For treatment visits, patients can not have anyone with them due to social distancing guidelines and our immunocompromised population.  ? ? ? ?

## 2021-12-11 LAB — RHEUMATOID FACTOR: Rheumatoid fact SerPl-aCnc: <10 IU/mL (ref <14.0)

## 2021-12-11 LAB — COPPER, SERUM: Copper: 106 ug/dL (ref 69–132)

## 2021-12-13 LAB — PROTEIN ELECTROPHORESIS, SERUM
A/G Ratio: 1 (ref 0.7–1.7)
Albumin ELP: 3.3 g/dL (ref 2.9–4.4)
Alpha-1-Globulin: 0.2 g/dL (ref 0.0–0.4)
Alpha-2-Globulin: 0.6 g/dL (ref 0.4–1.0)
Beta Globulin: 1 g/dL (ref 0.7–1.3)
Gamma Globulin: 1.5 g/dL (ref 0.4–1.8)
Globulin, Total: 3.3 g/dL (ref 2.2–3.9)
Total Protein ELP: 6.6 g/dL (ref 6.0–8.5)

## 2021-12-13 LAB — METHYLMALONIC ACID, SERUM: Methylmalonic Acid, Quantitative: 164 nmol/L (ref 0–378)

## 2021-12-13 LAB — ANTINUCLEAR ANTIBODIES, IFA: ANA Ab, IFA: NEGATIVE

## 2021-12-13 NOTE — Telephone Encounter (Signed)
Ever heard of platelets being affected by insulin?  I've never heard of this and couldn't find anything in the literature. ?

## 2021-12-14 DIAGNOSIS — E1122 Type 2 diabetes mellitus with diabetic chronic kidney disease: Secondary | ICD-10-CM | POA: Diagnosis not present

## 2021-12-14 DIAGNOSIS — Z794 Long term (current) use of insulin: Secondary | ICD-10-CM | POA: Diagnosis not present

## 2021-12-14 DIAGNOSIS — N182 Chronic kidney disease, stage 2 (mild): Secondary | ICD-10-CM | POA: Diagnosis not present

## 2021-12-14 LAB — PATHOLOGIST SMEAR REVIEW

## 2021-12-14 LAB — H PYLORI, IGM, IGG, IGA AB
H Pylori IgG: 1 Index Value — ABNORMAL HIGH (ref 0.00–0.79)
H. Pylogi, Iga Abs: 14.6 units — ABNORMAL HIGH (ref 0.0–8.9)
H. Pylogi, Igm Abs: <9 units (ref 0.0–8.9)

## 2021-12-22 ENCOUNTER — Encounter: Payer: Self-pay | Admitting: Neurology

## 2021-12-22 ENCOUNTER — Encounter: Payer: Self-pay | Admitting: Family Medicine

## 2021-12-22 ENCOUNTER — Other Ambulatory Visit: Payer: Self-pay | Admitting: Family Medicine

## 2021-12-22 DIAGNOSIS — E1169 Type 2 diabetes mellitus with other specified complication: Secondary | ICD-10-CM

## 2021-12-22 DIAGNOSIS — R600 Localized edema: Secondary | ICD-10-CM

## 2021-12-28 ENCOUNTER — Telehealth: Payer: Self-pay | Admitting: Family Medicine

## 2021-12-28 NOTE — Telephone Encounter (Signed)
Pt called requesting to speak with Georgina Pillion. ?

## 2021-12-29 NOTE — Telephone Encounter (Signed)
Called pt back - discussed recent office visits with specialist  ?  ?

## 2021-12-29 NOTE — Progress Notes (Signed)
? ?[facility] ?[address] ?[place], [place] [address] ? ? ?CLINIC:  ?Medical Oncology/Hematology ? ?PCP:  ?[name], DO ?[address] ?[place] [address] ?[phone] ? ? ?REASON FOR VISIT:  ?Follow-up for thrombocytopenia ? ?PRIOR THERAPY: None ? ?CURRENT THERAPY: Under work-up ? ?INTERVAL HISTORY:  ?Tony Cantu 71 y.o. male returns for routine follow-up of his thrombocytopenia.  He was seen for initial consultation by Dr. Delton Coombes on 12/10/2021. ? ?At today's visit, he reports feeling well.  He has not had any changes in his health since his visit with Dr. Delton Coombes 3 weeks ago. ? ?He continues to deny any major bleeding episodes.  He does report some easy bruising but denies any petechial rash.  No fever, chills, night sweats, unintentional weight loss. ?He has 25% energy and 100% appetite. He endorses that he is maintaining a stable weight. ? ? ?REVIEW OF SYSTEMS:  ?Review of Systems  ?Constitutional:  Positive for fatigue. Negative for appetite change, chills, diaphoresis, fever and unexpected weight change.  ?HENT:   Negative for lump/mass and nosebleeds.   ?Eyes:  Negative for eye problems.  ?Respiratory:  Positive for cough. Negative for hemoptysis and shortness of breath.   ?Cardiovascular:  Negative for chest pain, leg swelling and palpitations.  ?Gastrointestinal:  Negative for abdominal pain, blood in stool, constipation, diarrhea, nausea and vomiting.  ?Genitourinary:  Negative for hematuria.   ?Skin: Negative.   ?Neurological:  Negative for dizziness, headaches and light-headedness.  ?Hematological:  Does not bruise/bleed easily.   ? ? ?PAST MEDICAL/SURGICAL HISTORY:  ?Past Medical History:  ?Diagnosis Date  ? Aortic stenosis   ? Arthritis   ? Asthma   ? BPH (benign prostatic hypertrophy)   ? Cirrhosis ([place]) 2016  ? Colon polyps   ? Diabetes mellitus without complication ([place])   ? TYPE 1   ? Diverticulosis   ? Dysrhythmia 04/19/2021  ? A-fib noted with possible A-fib RVR  ?  Gastric ulcer   ? Gastric varices   ? right  ? Gastritis   ? GERD (gastroesophageal reflux disease)   ? Headache   ? History- pt states these were migraines that occurred in the 1980's  ? History of kidney stones   ? Hypertension   ? PONV (postoperative nausea and vomiting)   ? pt states he has never had post op nausea or vomiting  ? Renal cyst, right   ? Seizures ([place])   ? HYPOGLYCEMIC LAST 1 AND 1/2 YRS AGO  ? Sleep apnea   ? uses CPAP nightly  ? Testicle trouble   ? one testicle BORN WITH  ? Tubular adenoma of colon   ? ?Past Surgical History:  ?Procedure Laterality Date  ? BACK SURGERY    ? 94  LOWER   ? CARPAL TUNNEL RELEASE Right 10/13/2015  ? Procedure: RIGHT CARPAL TUNNEL RELEASE;  Surgeon: Daryll Brod, MD;  Location: [facility];  Service: Orthopedics;  Laterality: Right;  ? CARPAL TUNNEL RELEASE Left 07/05/2016  ? Procedure: LEFT CARPAL TUNNEL RELEASE;  Surgeon: Daryll Brod, MD;  Location: [facility];  Service: Orthopedics;  Laterality: Left;  ? DRUG INDUCED ENDOSCOPY N/A 07/02/2021  ? Procedure: DRUG INDUCED SLEEP ENDOSCOPY;  Surgeon: Melida Quitter, MD;  Location: [facility];  Service: ENT;  Laterality: N/A;  ? IMPLANTATION OF HYPOGLOSSAL NERVE STIMULATOR Right 10/01/2021  ? Procedure: IMPLANTATION OF HYPOGLOSSAL NERVE STIMULATOR;  Surgeon: Melida Quitter, MD;  Location: [facility];  Service: ENT;  Laterality:  Right;  ? INGUINAL HERNIA REPAIR  2003  ? right   ? KNEE ARTHROSCOPY Right 03/03/2016  ? [place] SURGERY  03/1995  ? Dr. Coralyn Mark, discectomy  ? LUMBAR LAMINECTOMY/DECOMPRESSION MICRODISCECTOMY Right 10/06/2016  ? Procedure: RIGHT LUMBAR THREE - LUMBAR FOUR  LAMINECTOMY, FORAMINOTOMY AND MICRODISCECTOMY;  Surgeon: Jovita Gamma, MD;  Location: [facility];  Service: Neurosurgery;  Laterality: Right;  ? SHOULDER SURGERY  11/28/2005  ? left partial  ? SHOULDER SURGERY  07/14/2006  ? RIGHT  ? TONSILLECTOMY  AGE 18 OR 4  ? TOTAL SHOULDER ARTHROPLASTY Right 11/01/2018  ? Procedure: RIGHT  SHOULDER REVISION TO REVERSE TOTAL SHOULDER;  Surgeon: Tania Ade, MD;  [facility];  Service: Orthopedics;  Laterality: Right;  CHOICE ANESTHESIA WITH INTERSCALENE BLOCK EXPAREL, NEEDS RNFA  ? ? ? ?SOCIAL HISTORY:  ?Social History  ? ?Socioeconomic History  ? Marital status: Married  ?  Spouse name: Not on file  ? Number of children: 1  ? Years of education: Not on file  ? Highest education level: Not on file  ?Occupational History  ? Occupation: retired   ?Tobacco Use  ? Smoking status: Former  ?  Years: 34.00  ?  Types: Cigarettes, Pipe  ?  Quit date: 2005  ?  Years since quitting: 18.3  ? Smokeless tobacco: Never  ? Tobacco comments:  ?  quit 2005 smoked cigarettes for 5 yrs prior to pipe use  ?Vaping Use  ? Vaping Use: Never used  ?Substance and Sexual Activity  ? Alcohol use: Not Currently  ? Drug use: No  ? Sexual activity: Yes  ?Other Topics Concern  ? Not on file  ?Social History Narrative  ? ** Merged History Encounter **   ? Right handed   ? ?Social Determinants of Health  ? ?Financial Resource Strain: Not on file  ?Food Insecurity: Not on file  ?Transportation Needs: Not on file  ?Physical Activity: Not on file  ?Stress: Not on file  ?Social Connections: Not on file  ?Intimate Partner Violence: Not on file  ? ? ?FAMILY HISTORY:  ?Family History  ?Problem Relation Age of Onset  ? Heart failure Mother   ? Heart attack Mother   ?     in her 16's  ? Alzheimer's disease Mother   ? Diabetes Mother   ? Asthma Father   ? Suicidality Father   ?     in his 20's  ? Allergic rhinitis Sister   ? Heart failure Maternal Grandmother   ? Rheum arthritis Maternal Grandmother   ? Breast cancer Maternal Grandmother   ? Heart attack Maternal Grandmother   ?     in her 68's  ? Colon cancer Neg Hx   ? Esophageal cancer Neg Hx   ? Pancreatic cancer Neg Hx   ? Liver disease Neg Hx   ? ? ?CURRENT MEDICATIONS:  ?Outpatient Encounter Medications as of 12/30/2021  ?Medication Sig  ? albuterol (VENTOLIN HFA) 108 (90 Base)  MCG/ACT inhaler Inhale 2 puffs into the lungs every 6 (six) hours as needed for wheezing or shortness of breath.  ? apixaban (ELIQUIS) 5 MG TABS tablet Take 1 tablet (5 mg total) by mouth 2 (two) times daily.  ? atorvastatin (LIPITOR) 40 MG tablet TAKE 1 TABLET BY MOUTH DAILY  ? b complex vitamins tablet Take 1 tablet by mouth daily.  ? Blood Glucose Monitoring Suppl (CONTOUR NEXT ONE) KIT Test BS QID and as needed Dx E11.9  ? Cholecalciferol (VITAMIN D3)  2000 UNITS capsule Take 2,000 Units by mouth daily.  ? Coenzyme Q10 (COQ10) 200 MG CAPS Take 200 mg by mouth daily.  ? Continuous Blood Gluc Receiver (FREESTYLE LIBRE READER) DEVI 1 applicator by Does not apply route as directed.  ? Continuous Blood Gluc Sensor (FREESTYLE LIBRE SENSOR SYSTEM) MISC Check BS eight (8) times a day. Dx E10.9  ? diltiazem (CARDIZEM) 30 MG tablet Take 1 tablet (30 mg total) by mouth 2 (two) times daily.  ? furosemide (LASIX) 20 MG tablet TAKE 1 AND 1/2 TABLETS BY MOUTH  DAILY  ? Glucagon, rDNA, (GLUCAGON EMERGENCY) 1 MG KIT INJECT AS DIRECTED INTO  UPPER ARM, THIGH OR  BUTTOCKS AS NEEDED FOR  SEVERE HYPOGLYCEMIA. SEEK  MEDICAL ATTENTION AFTER USE  ? Glucosamine-Chondroit-Vit C-Mn (GLUCOSAMINE 1500 COMPLEX) CAPS Take 2 capsules by mouth daily.  ? glucose blood (CONTOUR NEXT TEST) test strip Test BS QID and as needed Dx E11.9  ? insulin lispro (HUMALOG) 100 UNIT/ML injection INJECT SUBCUTANEOUSLY 20 TO 50 UNITS 3 TIMES DAILY  BEFORE MEALS PER SLIDING  SCALE  ? magnesium oxide (MAG-OX) 400 MG tablet Take 400 mg by mouth daily.  ? Microlet Lancets MISC Test BS QID and as needed Dx E11.9  ? montelukast (SINGULAIR) 10 MG tablet TAKE 1 TABLET BY MOUTH AT  BEDTIME  ? multivitamin-lutein (OCUVITE-LUTEIN) CAPS capsule Take 1 capsule by mouth daily.  ? pantoprazole (PROTONIX) 40 MG tablet Take 1 tablet (40 mg total) by mouth 2 (two) times daily before a meal.  ? Testosterone 30 MG/ACT SOLN APPLY ONE PUMP TOPICALLY  UNDER EACH AXILLA DAILY  (TOTAL  OF 60MG DAILY )  ? TOUJEO MAX SOLOSTAR 300 UNIT/ML Solostar Pen INJECT SUBCUTANEOUSLY 110  UNITS DAILY (Patient taking differently: Inject 80 Units into the skin daily.)  ? Turmeric Curcumin 500 MG CAPS [place]

## 2021-12-30 ENCOUNTER — Inpatient Hospital Stay (HOSPITAL_COMMUNITY): Payer: BC Managed Care – PPO | Attending: Hematology | Admitting: Physician Assistant

## 2021-12-30 VITALS — BP 117/64 | HR 66 | Temp 98.3°F | Resp 18 | Ht 67.91 in | Wt 231.9 lb

## 2021-12-30 DIAGNOSIS — D696 Thrombocytopenia, unspecified: Secondary | ICD-10-CM | POA: Diagnosis not present

## 2021-12-30 DIAGNOSIS — Z803 Family history of malignant neoplasm of breast: Secondary | ICD-10-CM | POA: Insufficient documentation

## 2021-12-30 DIAGNOSIS — Z8719 Personal history of other diseases of the digestive system: Secondary | ICD-10-CM | POA: Diagnosis not present

## 2021-12-30 DIAGNOSIS — I10 Essential (primary) hypertension: Secondary | ICD-10-CM | POA: Diagnosis not present

## 2021-12-30 DIAGNOSIS — E119 Type 2 diabetes mellitus without complications: Secondary | ICD-10-CM | POA: Insufficient documentation

## 2021-12-30 DIAGNOSIS — Z8249 Family history of ischemic heart disease and other diseases of the circulatory system: Secondary | ICD-10-CM | POA: Insufficient documentation

## 2021-12-30 DIAGNOSIS — R059 Cough, unspecified: Secondary | ICD-10-CM | POA: Insufficient documentation

## 2021-12-30 DIAGNOSIS — Z836 Family history of other diseases of the respiratory system: Secondary | ICD-10-CM | POA: Diagnosis not present

## 2021-12-30 DIAGNOSIS — Z7901 Long term (current) use of anticoagulants: Secondary | ICD-10-CM | POA: Diagnosis not present

## 2021-12-30 DIAGNOSIS — I350 Nonrheumatic aortic (valve) stenosis: Secondary | ICD-10-CM | POA: Diagnosis not present

## 2021-12-30 DIAGNOSIS — R5383 Other fatigue: Secondary | ICD-10-CM | POA: Diagnosis not present

## 2021-12-30 DIAGNOSIS — Z882 Allergy status to sulfonamides status: Secondary | ICD-10-CM | POA: Diagnosis not present

## 2021-12-30 DIAGNOSIS — Z87891 Personal history of nicotine dependence: Secondary | ICD-10-CM | POA: Diagnosis not present

## 2021-12-30 DIAGNOSIS — Z818 Family history of other mental and behavioral disorders: Secondary | ICD-10-CM | POA: Diagnosis not present

## 2021-12-30 DIAGNOSIS — Z8261 Family history of arthritis: Secondary | ICD-10-CM | POA: Diagnosis not present

## 2021-12-30 DIAGNOSIS — Z881 Allergy status to other antibiotic agents status: Secondary | ICD-10-CM | POA: Diagnosis not present

## 2021-12-30 DIAGNOSIS — Z833 Family history of diabetes mellitus: Secondary | ICD-10-CM | POA: Insufficient documentation

## 2021-12-30 DIAGNOSIS — G4730 Sleep apnea, unspecified: Secondary | ICD-10-CM | POA: Diagnosis not present

## 2021-12-30 DIAGNOSIS — Z79899 Other long term (current) drug therapy: Secondary | ICD-10-CM | POA: Diagnosis not present

## 2021-12-30 DIAGNOSIS — Z87442 Personal history of urinary calculi: Secondary | ICD-10-CM | POA: Diagnosis not present

## 2021-12-30 DIAGNOSIS — E669 Obesity, unspecified: Secondary | ICD-10-CM | POA: Diagnosis not present

## 2021-12-30 NOTE — Patient Instructions (Signed)
[place] at [facility] ?Discharge Instructions ? ?You were seen today by Tarri Abernethy PA-C for your low platelets (thrombocytopenia). ? ?Your lab tests did not reveal any alternative causes of low platelets, so we believe that your low platelets are related to your liver cirrhosis.  You may also have some aspect of immune system dysfunction which is causing your body to attack your platelets. ?- Regardless of cause, you do not need treatment at this point in time. ?- As long as you have platelets higher than 50, you are not at severe risk of major bleeding events.  If your platelets drop to less than 30, we would consider various treatment options. ?- We will continue to monitor your platelet levels with repeat labs and office visit in 6 months. ?- Please let us know if you have any major bleeding events or severe bruising prior to that time. ? ?FOLLOW-UP APPOINTMENT: Repeat labs and follow-up visit in 6 months ? ? ?Thank you for choosing [place] at [facility] to provide your oncology and hematology care.  To afford each patient quality time with our provider, please arrive at least 15 minutes before your scheduled appointment time.  ? ?If you have a lab appointment with the [facility] please come in thru the Main Entrance and check in at the main information desk. ? ?You need to re-schedule your appointment should you arrive 10 or more minutes late.  We strive to give you quality time with our providers, and arriving late affects you and other patients whose appointments are after yours.  Also, if you no show three or more times for appointments you may be dismissed from the clinic at the providers discretion.     ?Again, thank you for choosing [facility]  Our hope is that these requests will decrease the amount of time that you wait before being seen by our physicians.        ?_____________________________________________________________ ? ?Should you have questions after your visit to [facility], please contact our office at [phone] and follow the prompts.  Our office hours are 8:00 a.m. and 4:30 p.m. Monday - Friday.  Please note that voicemails left after 4:00 p.m. may not be returned until the following business day.  We are closed weekends and major holidays.  You do have access to a nurse 24-7, just call the main number to the clinic [phone] and do not press any options, hold on the line and a nurse will answer the phone.   ? ?For prescription refill requests, have your pharmacy contact our office and allow 72 hours.   ? ?Due to Covid, you will need to wear a mask upon entering the hospital. If you do not have a mask, a mask will be given to you at the Main Entrance upon arrival. For doctor visits, patients may have 1 support person age 50 or older with them. For treatment visits, patients can not have anyone with them due to social distancing guidelines and our immunocompromised population.  ? ? ? ?

## 2022-01-04 ENCOUNTER — Other Ambulatory Visit: Payer: Self-pay

## 2022-01-04 ENCOUNTER — Telehealth: Payer: Self-pay | Admitting: Cardiology

## 2022-01-04 ENCOUNTER — Ambulatory Visit: Payer: Medicare Other

## 2022-01-04 DIAGNOSIS — E1122 Type 2 diabetes mellitus with diabetic chronic kidney disease: Secondary | ICD-10-CM | POA: Diagnosis not present

## 2022-01-04 DIAGNOSIS — M170 Bilateral primary osteoarthritis of knee: Secondary | ICD-10-CM | POA: Diagnosis not present

## 2022-01-04 DIAGNOSIS — M1712 Unilateral primary osteoarthritis, left knee: Secondary | ICD-10-CM | POA: Diagnosis not present

## 2022-01-04 DIAGNOSIS — E1169 Type 2 diabetes mellitus with other specified complication: Secondary | ICD-10-CM | POA: Diagnosis not present

## 2022-01-04 DIAGNOSIS — E785 Hyperlipidemia, unspecified: Secondary | ICD-10-CM | POA: Diagnosis not present

## 2022-01-04 DIAGNOSIS — N182 Chronic kidney disease, stage 2 (mild): Secondary | ICD-10-CM | POA: Diagnosis not present

## 2022-01-04 DIAGNOSIS — M1711 Unilateral primary osteoarthritis, right knee: Secondary | ICD-10-CM | POA: Diagnosis not present

## 2022-01-04 DIAGNOSIS — Z794 Long term (current) use of insulin: Secondary | ICD-10-CM | POA: Diagnosis not present

## 2022-01-04 LAB — BAYER DCA HB A1C WAIVED: HB A1C (BAYER DCA - WAIVED): 4.4 % — ABNORMAL LOW (ref 4.8–5.6)

## 2022-01-04 NOTE — Telephone Encounter (Signed)
? ?  Pre-operative Risk Assessment  ?  ?Patient Name: Tony Cantu  ?DOB: 1950-10-20 ?MRN: [id number]  ? ?  ? ?Request for Surgical Clearance   ? ?Procedure:   Right Knee Replacement ? ?Date of Surgery:  Clearance TBD                              ?   ?Surgeon:  Dr. Frederik Pear ?Surgeon's Group or Practice Name:  [facility] ?Phone number:  [phone] ?Fax number:  [phone] ?  ?Type of Clearance Requested:   ?- Medical  ?  ?Type of Anesthesia:  Spinal ?  ?Additional requests/questions:  Please advise surgeon/provider what medications should be held.- Eliquis ? ?Signed, ?Belisicia T Harris   ?01/04/2022, 11:18 AM  ? ? ?

## 2022-01-04 NOTE — Telephone Encounter (Signed)
Clinical pharmacist to review Eliquis ?

## 2022-01-05 ENCOUNTER — Encounter: Payer: Self-pay | Admitting: Family Medicine

## 2022-01-05 ENCOUNTER — Telehealth: Payer: Self-pay | Admitting: *Deleted

## 2022-01-05 ENCOUNTER — Ambulatory Visit (INDEPENDENT_AMBULATORY_CARE_PROVIDER_SITE_OTHER): Payer: BC Managed Care – PPO | Admitting: Family Medicine

## 2022-01-05 VITALS — BP 98/56 | HR 73 | Temp 98.1°F | Ht 67.91 in | Wt 230.8 lb

## 2022-01-05 DIAGNOSIS — E1169 Type 2 diabetes mellitus with other specified complication: Secondary | ICD-10-CM | POA: Diagnosis not present

## 2022-01-05 DIAGNOSIS — E1122 Type 2 diabetes mellitus with diabetic chronic kidney disease: Secondary | ICD-10-CM

## 2022-01-05 DIAGNOSIS — Z1211 Encounter for screening for malignant neoplasm of colon: Secondary | ICD-10-CM

## 2022-01-05 DIAGNOSIS — G4733 Obstructive sleep apnea (adult) (pediatric): Secondary | ICD-10-CM

## 2022-01-05 DIAGNOSIS — Z23 Encounter for immunization: Secondary | ICD-10-CM | POA: Diagnosis not present

## 2022-01-05 DIAGNOSIS — D696 Thrombocytopenia, unspecified: Secondary | ICD-10-CM

## 2022-01-05 DIAGNOSIS — Z794 Long term (current) use of insulin: Secondary | ICD-10-CM

## 2022-01-05 DIAGNOSIS — M25562 Pain in left knee: Secondary | ICD-10-CM

## 2022-01-05 DIAGNOSIS — N182 Chronic kidney disease, stage 2 (mild): Secondary | ICD-10-CM | POA: Diagnosis not present

## 2022-01-05 DIAGNOSIS — E785 Hyperlipidemia, unspecified: Secondary | ICD-10-CM | POA: Diagnosis not present

## 2022-01-05 DIAGNOSIS — G8929 Other chronic pain: Secondary | ICD-10-CM | POA: Diagnosis not present

## 2022-01-05 DIAGNOSIS — M25561 Pain in right knee: Secondary | ICD-10-CM

## 2022-01-05 DIAGNOSIS — E1159 Type 2 diabetes mellitus with other circulatory complications: Secondary | ICD-10-CM

## 2022-01-05 DIAGNOSIS — Z130 Encounter for screening for diseases of the blood and blood-forming organs and certain disorders involving the immune mechanism: Secondary | ICD-10-CM

## 2022-01-05 DIAGNOSIS — I152 Hypertension secondary to endocrine disorders: Secondary | ICD-10-CM

## 2022-01-05 LAB — LIPID PANEL
Chol/HDL Ratio: 2.2 ratio (ref 0.0–5.0)
Cholesterol, Total: 126 mg/dL (ref 100–199)
HDL: 57 mg/dL (ref >39)
LDL Chol Calc (NIH): 55 mg/dL (ref 0–99)
Triglycerides: 67 mg/dL (ref 0–149)
VLDL Cholesterol Cal: 14 mg/dL (ref 5–40)

## 2022-01-05 MED ORDER — TOUJEO MAX SOLOSTAR 300 UNIT/ML ~~LOC~~ SOPN: 50.0000 [IU] | PEN_INJECTOR | Freq: Every day | SUBCUTANEOUS | 0 refills | Status: DC

## 2022-01-05 MED ORDER — VALSARTAN 40 MG PO TABS: 40.0000 mg | ORAL_TABLET | Freq: Every day | ORAL | 3 refills | Status: DC

## 2022-01-05 NOTE — Progress Notes (Signed)
? ?Subjective: ?CC:DM ?PCP: [name], DO ?NOI:[id number] W Tony Cantu is a 71 y.o. male presenting to clinic today for: ? ?1. Type 2 Diabetes with hypertension, hyperlipidemia:  ?He brings me his freestyle libre device and he has had several hypoglycemic episodes into the 50s noted.  He is currently injecting 70 to 75 units of Toujeo max nightly.  He does not feel the low blood sugars.  He used to feel them but does not get woken up by them now.  He does not feel excessively fatigued during the day.  He monitors blood pressures at home and they typically are around upper 90s over 50s.  Again no reports of dizziness, falls or presyncopal episodes.  He is only utilizing Diovan 80 mg at this time.  Continues Lipitor 40 mg, Cardizem 30 mg twice daily. ? ?Last eye exam: Up-to-date.  Saw Dr. Alethia Berthold in October 2022 ?Last foot exam: Up-to-date ?Last A1c:  ?Lab Results  ?Component Value Date  ? HGBA1C 4.4 (L) 01/04/2022  ? ?Nephropathy screen indicated?:  On ARB ?Last flu, zoster and/or pneumovax:  ?Immunization History  ?Administered Date(s) Administered  ? Fluad Quad(high Dose 65+) 05/17/2019, 05/19/2020, 07/06/2021  ? Influenza Split 06/05/2013  ? Influenza Whole 06/15/2010, 06/16/2011, 05/29/2012  ? Influenza, High Dose Seasonal PF 06/25/2015, 06/25/2015, 06/12/2017  ? Influenza,inj,Quad PF,6+ Mos 06/05/2013, 06/17/2014, 06/07/2016  ? Influenza-Unspecified 07/04/2008, 06/17/2014, 06/25/2015, 06/12/2017, 05/22/2018  ? Moderna SARS-COV2 Booster Vaccination 08/26/2020  ? Moderna Sars-Covid-2 Vaccination 11/07/2019, 12/05/2019, 08/26/2020  ? Pneumococcal Conjugate-13 06/05/2013  ? Pneumococcal Polysaccharide-23 06/03/2011, 07/04/2011, 09/16/2019  ? Tdap 08/30/2007, 12/27/2017  ? Zoster, Live 02/01/2012  ? ? ?ROS: Denies dizziness, LOC, polyuria, polydipsia, unintended weight loss/gain, foot ulcerations, numbness or tingling in extremities, shortness of breath or chest pain. ? ?2.  Bilateral knee pain ?Patient will be  undergoing surgery and has an evaluation with orthopedics soon.  He has multiple preop clearance evaluations scheduled.  Has never had any complications postsurgery.  No difficulty arousing from sedation nor any postop nausea and vomiting. ? ?3.  OSA ?Patient is treated with inspire implanted device, which is in fact MRI compatible.  He feels like this works very well for him and he is adjusted quite well.  He will be having a repeat sleep study soon to make sure that nothing needs to be titrated.  This was implanted in February 2023 by Dr. Redmond Baseman at [facility] ? ? ?ROS: Per HPI ? ?Allergies  ?Allergen Reactions  ? Erythromycin Diarrhea  ? Sulfonamide Derivatives Diarrhea  ? ?Past Medical History:  ?Diagnosis Date  ? Aortic stenosis   ? Arthritis   ? Asthma   ? BPH (benign prostatic hypertrophy)   ? Cirrhosis ([place]) 2016  ? Colon polyps   ? Diabetes mellitus without complication ([place])   ? TYPE 1   ? Diverticulosis   ? Dysrhythmia 04/19/2021  ? A-fib noted with possible A-fib RVR  ? Gastric ulcer   ? Gastric varices   ? right  ? Gastritis   ? GERD (gastroesophageal reflux disease)   ? Headache   ? History- pt states these were migraines that occurred in the 1980's  ? History of kidney stones   ? Hypertension   ? PONV (postoperative nausea and vomiting)   ? pt states he has never had post op nausea or vomiting  ? Renal cyst, right   ? Seizures ([place])   ? HYPOGLYCEMIC LAST 1 AND 1/2 YRS AGO  ? Sleep apnea   ? uses  CPAP nightly  ? Testicle trouble   ? one testicle BORN WITH  ? Tubular adenoma of colon   ? ? ?Current Outpatient Medications:  ?  albuterol (VENTOLIN HFA) 108 (90 Base) MCG/ACT inhaler, Inhale 2 puffs into the lungs every 6 (six) hours as needed for wheezing or shortness of breath., Disp: 6.7 g, Rfl: 0 ?  apixaban (ELIQUIS) 5 MG TABS tablet, Take 1 tablet (5 mg total) by mouth 2 (two) times daily., Disp: 180 tablet, Rfl: 1 ?  atorvastatin (LIPITOR) 40 MG tablet, TAKE 1 TABLET BY MOUTH DAILY, Disp: 90  tablet, Rfl: 0 ?  b complex vitamins tablet, Take 1 tablet by mouth daily., Disp: , Rfl:  ?  Blood Glucose Monitoring Suppl (CONTOUR NEXT ONE) KIT, Test BS QID and as needed Dx E11.9, Disp: 500 kit, Rfl: 3 ?  Cholecalciferol (VITAMIN D3) 2000 UNITS capsule, Take 2,000 Units by mouth daily., Disp: , Rfl:  ?  Coenzyme Q10 (COQ10) 200 MG CAPS, Take 200 mg by mouth daily., Disp: , Rfl:  ?  Continuous Blood Gluc Receiver (FREESTYLE LIBRE READER) DEVI, 1 applicator by Does not apply route as directed., Disp: 1 Device, Rfl: 1 ?  Continuous Blood Gluc Sensor (FREESTYLE LIBRE SENSOR SYSTEM) MISC, Check BS eight (8) times a day. Dx E10.9, Disp: 3 each, Rfl: 3 ?  diltiazem (CARDIZEM) 30 MG tablet, Take 1 tablet (30 mg total) by mouth 2 (two) times daily., Disp: 180 tablet, Rfl: 3 ?  furosemide (LASIX) 20 MG tablet, TAKE 1 AND 1/2 TABLETS BY MOUTH  DAILY, Disp: 135 tablet, Rfl: 0 ?  Glucagon, rDNA, (GLUCAGON EMERGENCY) 1 MG KIT, INJECT AS DIRECTED INTO  UPPER ARM, THIGH OR  BUTTOCKS AS NEEDED FOR  SEVERE HYPOGLYCEMIA. SEEK  MEDICAL ATTENTION AFTER USE, Disp: 3 kit, Rfl: 0 ?  Glucosamine-Chondroit-Vit C-Mn (GLUCOSAMINE 1500 COMPLEX) CAPS, Take 2 capsules by mouth daily., Disp: , Rfl:  ?  glucose blood (CONTOUR NEXT TEST) test strip, Test BS QID and as needed Dx E11.9, Disp: 400 strip, Rfl: 3 ?  insulin lispro (HUMALOG) 100 UNIT/ML injection, INJECT SUBCUTANEOUSLY 20 TO 50 UNITS 3 TIMES DAILY  BEFORE MEALS PER SLIDING  SCALE, Disp: 140 mL, Rfl: 0 ?  magnesium oxide (MAG-OX) 400 MG tablet, Take 400 mg by mouth daily., Disp: , Rfl:  ?  Microlet Lancets MISC, Test BS QID and as needed Dx E11.9, Disp: 500 each, Rfl: 3 ?  montelukast (SINGULAIR) 10 MG tablet, TAKE 1 TABLET BY MOUTH AT  BEDTIME, Disp: 90 tablet, Rfl: 3 ?  multivitamin-lutein (OCUVITE-LUTEIN) CAPS capsule, Take 1 capsule by mouth daily., Disp: , Rfl:  ?  pantoprazole (PROTONIX) 40 MG tablet, Take 1 tablet (40 mg total) by mouth 2 (two) times daily before a meal., Disp:  180 tablet, Rfl: 3 ?  Testosterone 30 MG/ACT SOLN, APPLY ONE PUMP TOPICALLY  UNDER EACH AXILLA DAILY  (TOTAL OF 60MG DAILY ), Disp: 270 mL, Rfl: 1 ?  TOUJEO MAX SOLOSTAR 300 UNIT/ML Solostar Pen, INJECT SUBCUTANEOUSLY 110  UNITS DAILY (Patient taking differently: Inject 80 Units into the skin daily.), Disp: 36 mL, Rfl: 0 ?  Turmeric Curcumin 500 MG CAPS, Take 500 mg by mouth daily., Disp: , Rfl:  ?  valsartan (DIOVAN) 160 MG tablet, TAKE 1 TABLET BY MOUTH DAILY, Disp: 90 tablet, Rfl: 0 ?Social History  ? ?Socioeconomic History  ? Marital status: Married  ?  Spouse name: Not on file  ? Number of children: 1  ? Years of education:  Not on file  ? Highest education level: Not on file  ?Occupational History  ? Occupation: retired   ?Tobacco Use  ? Smoking status: Former  ?  Years: 34.00  ?  Types: Cigarettes, Pipe  ?  Quit date: 2005  ?  Years since quitting: 18.3  ? Smokeless tobacco: Never  ? Tobacco comments:  ?  quit 2005 smoked cigarettes for 5 yrs prior to pipe use  ?Vaping Use  ? Vaping Use: Never used  ?Substance and Sexual Activity  ? Alcohol use: Not Currently  ? Drug use: No  ? Sexual activity: Yes  ?Other Topics Concern  ? Not on file  ?Social History Narrative  ? ** Merged History Encounter **   ? Right handed   ? ?Social Determinants of Health  ? ?Financial Resource Strain: Not on file  ?Food Insecurity: Not on file  ?Transportation Needs: Not on file  ?Physical Activity: Not on file  ?Stress: Not on file  ?Social Connections: Not on file  ?Intimate Partner Violence: Not on file  ? ?Family History  ?Problem Relation Age of Onset  ? Heart failure Mother   ? Heart attack Mother   ?     in her 76's  ? Alzheimer's disease Mother   ? Diabetes Mother   ? Asthma Father   ? Suicidality Father   ?     in his 38's  ? Allergic rhinitis Sister   ? Heart failure Maternal Grandmother   ? Rheum arthritis Maternal Grandmother   ? Breast cancer Maternal Grandmother   ? Heart attack Maternal Grandmother   ?     in her 14's   ? Colon cancer Neg Hx   ? Esophageal cancer Neg Hx   ? Pancreatic cancer Neg Hx   ? Liver disease Neg Hx   ? ? ?Objective: ?Office vital signs reviewed. ?BP (!) 98/56   Pulse 73   Temp 98.1 ?F (36.7 ?C)   Ht 5'

## 2022-01-05 NOTE — Telephone Encounter (Signed)
Patient will need a virtual telephone visit for clearance ?

## 2022-01-05 NOTE — Addendum Note (Signed)
Addended by: Everlean Cherry on: 01/05/2022 02:29 PM ? ? Modules accepted: Orders ? ?

## 2022-01-05 NOTE — Telephone Encounter (Signed)
Tele pre op appt 01/06/22 @ 11 am. Med rec and consent are done.  ? ?  ?Patient Consent for Virtual Visit  ? ? ?   ? ?[name] has provided verbal consent on 01/05/2022 for a virtual visit (video or telephone). ? ? ?CONSENT FOR VIRTUAL VISIT FOR:  Tony Cantu  ?By participating in this virtual visit I agree to the following: ? ?I hereby voluntarily request, consent and authorize [facility] and its employed or contracted physicians, physician assistants, nurse practitioners or other licensed health care professionals (the Practitioner), to provide me with telemedicine health care services (the ?Services") as deemed necessary by the treating Practitioner. I acknowledge and consent to receive the Services by the Practitioner via telemedicine. I understand that the telemedicine visit will involve communicating with the Practitioner through live audiovisual communication technology and the disclosure of certain medical information by electronic transmission. I acknowledge that I have been given the opportunity to request an in-person assessment or other available alternative prior to the telemedicine visit and am voluntarily participating in the telemedicine visit. ? ?I understand that I have the right to withhold or withdraw my consent to the use of telemedicine in the course of my care at any time, without affecting my right to future care or treatment, and that the Practitioner or I may terminate the telemedicine visit at any time. I understand that I have the right to inspect all information obtained and/or recorded in the course of the telemedicine visit and may receive copies of available information for a reasonable fee.  I understand that some of the potential risks of receiving the Services via telemedicine include:  ?Delay or interruption in medical evaluation due to technological equipment failure or disruption; ?Information transmitted may not be sufficient (e.g. poor resolution of images) to allow  for appropriate medical decision making by the Practitioner; and/or  ?In rare instances, security protocols could fail, causing a breach of personal health information. ? ?Furthermore, I acknowledge that it is my responsibility to provide information about my medical history, conditions and care that is complete and accurate to the best of my ability. I acknowledge that Practitioner's advice, recommendations, and/or decision may be based on factors not within their control, such as incomplete or inaccurate data provided by me or distortions of diagnostic images or specimens that may result from electronic transmissions. I understand that the practice of medicine is not an exact science and that Practitioner makes no warranties or guarantees regarding treatment outcomes. I acknowledge that a copy of this consent can be made available to me via my patient portal ([facility]), or I can request a printed copy by calling the office of [facility].   ? ?I understand that my insurance will be billed for this visit.  ? ?I have read or had this consent read to me. ?I understand the contents of this consent, which adequately explains the benefits and risks of the Services being provided via telemedicine.  ?I have been provided ample opportunity to ask questions regarding this consent and the Services and have had my questions answered to my satisfaction. ?I give my informed consent for the services to be provided through the use of telemedicine in my medical care ? ? ? ?

## 2022-01-05 NOTE — Telephone Encounter (Signed)
Tele pre op appt 01/06/22 @ 11 am. Med rec and consent are done.  ?

## 2022-01-05 NOTE — Telephone Encounter (Signed)
Patient with diagnosis of afib on Eliquis for anticoagulation.   ? ?Procedure: Right Knee Replacement ?Date of procedure: TBD ? ?CHA2DS2-VASc Score = 4  ? This indicates a 4.8% annual risk of stroke. ?The patient's score is based upon: ?CHF History: 0 ?HTN History: 1 ?Diabetes History: 1 ?Stroke History: 0 ?Vascular Disease History: 1 ?Age Score: 1 ?Gender Score: 0 ?  ?  ? ?CrCl 77 ml/min ? ?Per office protocol, patient can hold Eliquis for 3 days prior to procedure.   ? ? ?

## 2022-01-06 ENCOUNTER — Ambulatory Visit (INDEPENDENT_AMBULATORY_CARE_PROVIDER_SITE_OTHER): Payer: BC Managed Care – PPO | Admitting: General Practice

## 2022-01-06 DIAGNOSIS — Z01810 Encounter for preprocedural cardiovascular examination: Secondary | ICD-10-CM

## 2022-01-06 NOTE — Progress Notes (Signed)
? ?Virtual Visit via Telephone Note  ? ?This visit type was conducted due to national recommendations for restrictions regarding the COVID-19 Pandemic (e.g. social distancing) in an effort to limit this patient's exposure and mitigate transmission in our community.  Due to his co-morbid illnesses, this patient is at least at moderate risk for complications without adequate follow up.  This format is felt to be most appropriate for this patient at this time.  The patient did not have access to video technology/had technical difficulties with video requiring transitioning to audio format only (telephone).  All issues noted in this document were discussed and addressed.  No physical exam could be performed with this format.  Please refer to the patient's chart for his  consent to telehealth for Tony Cantu. ? ?Evaluation Performed:  Preoperative cardiovascular risk assessment ?_____________  ? ?Date:  01/06/2022  ? ?Patient ID:  Tony Cantu, DOB April 03, 1951, MRN [id number] ?Patient Location:  ?Home ?Provider location:   ?Office ? ?Primary Care Provider:  [name], DO ?Primary Cardiologist:  Minus Breeding, MD ? ?Chief Complaint  ?  ?71 y.o. y/o male with a h/o asthma, BPH, diabetes, GERD, HTN, PAF, who is pending right knee replacement, and presents today for telephonic preoperative cardiovascular risk assessment. ? ?Past Medical History  ?  ?Past Medical History:  ?Diagnosis Date  ? Aortic stenosis   ? Arthritis   ? Asthma   ? BPH (benign prostatic hypertrophy)   ? Cirrhosis ([place]) 2016  ? Colon polyps   ? Diabetes mellitus without complication ([employer])   ? TYPE 1   ? Diverticulosis   ? Dysrhythmia 04/19/2021  ? A-fib noted with possible A-fib RVR  ? Gastric ulcer   ? Gastric varices   ? right  ? Gastritis   ? GERD (gastroesophageal reflux disease)   ? Headache   ? History- pt states these were migraines that occurred in the 1980's  ? History of kidney stones   ? Hypertension   ? PONV (postoperative nausea  and vomiting)   ? pt states he has never had post op nausea or vomiting  ? Renal cyst, right   ? Seizures ([place])   ? HYPOGLYCEMIC LAST 1 AND 1/2 YRS AGO  ? Sleep apnea   ? uses CPAP nightly  ? Testicle trouble   ? one testicle BORN WITH  ? Tubular adenoma of colon   ? ?Past Surgical History:  ?Procedure Laterality Date  ? BACK SURGERY    ? 89  LOWER   ? CARPAL TUNNEL RELEASE Right 10/13/2015  ? Procedure: RIGHT CARPAL TUNNEL RELEASE;  Surgeon: Daryll Brod, MD;  Location: [facility];  Service: Orthopedics;  Laterality: Right;  ? CARPAL TUNNEL RELEASE Left 07/05/2016  ? Procedure: LEFT CARPAL TUNNEL RELEASE;  Surgeon: Daryll Brod, MD;  Location: [facility];  Service: Orthopedics;  Laterality: Left;  ? DRUG INDUCED ENDOSCOPY N/A 07/02/2021  ? Procedure: DRUG INDUCED SLEEP ENDOSCOPY;  Surgeon: Melida Quitter, MD;  Location: [facility];  Service: ENT;  Laterality: N/A;  ? IMPLANTATION OF HYPOGLOSSAL NERVE STIMULATOR Right 10/01/2021  ? Procedure: IMPLANTATION OF HYPOGLOSSAL NERVE STIMULATOR;  Surgeon: Melida Quitter, MD;  Location: [facility];  Service: ENT;  Laterality: Right;  ? INGUINAL HERNIA REPAIR  2003  ? right   ? KNEE ARTHROSCOPY Right 03/03/2016  ? [place] SURGERY  03/1995  ? Dr. Coralyn Mark, discectomy  ? LUMBAR LAMINECTOMY/DECOMPRESSION MICRODISCECTOMY Right 10/06/2016  ? Procedure: RIGHT LUMBAR THREE - LUMBAR FOUR  LAMINECTOMY, FORAMINOTOMY AND MICRODISCECTOMY;  Surgeon: Jovita Gamma, MD;  Location: [facility];  Service: Neurosurgery;  Laterality: Right;  ? SHOULDER SURGERY  11/28/2005  ? left partial  ? SHOULDER SURGERY  07/14/2006  ? RIGHT  ? TONSILLECTOMY  AGE 15 OR 4  ? TOTAL SHOULDER ARTHROPLASTY Right 11/01/2018  ? Procedure: RIGHT SHOULDER REVISION TO REVERSE TOTAL SHOULDER;  Surgeon: Tania Ade, MD;  Location: [facility];  Service: Orthopedics;  Laterality: Right;  CHOICE ANESTHESIA WITH INTERSCALENE BLOCK EXPAREL, NEEDS RNFA  ? ? ?Allergies ? ?Allergies  ?Allergen Reactions  ?  Erythromycin Diarrhea  ? Sulfonamide Derivatives Diarrhea  ? ? ?History of Present Illness  ?  ?Tony Cantu is a 71 y.o. male who presents via audio/video conferencing for a telehealth visit today.  Pt was last seen in cardiology clinic on 09/01/2021 by Dr. Percival Spanish.  At that time Tony Cantu was doing well .  The patient is now pending procedure as outlined above. Since his last visit, he remains stable from a cardiac standpoint. ? ?Today he denies chest pain, shortness of breath, lower extremity edema, fatigue, palpitations, melena, hematuria, hemoptysis, diaphoresis, weakness, presyncope, syncope, orthopnea, and PND. ? ?Home Medications  ?  ?Prior to Admission medications   ?Medication Sig Start Date End Date Taking? Authorizing Provider  ?albuterol (VENTOLIN HFA) 108 (90 Base) MCG/ACT inhaler Inhale 2 puffs into the lungs every 6 (six) hours as needed for wheezing or shortness of breath. 02/18/20   [name], DO  ?apixaban (ELIQUIS) 5 MG TABS tablet Take 1 tablet (5 mg total) by mouth 2 (two) times daily. 05/19/21   Deberah Pelton, NP  ?atorvastatin (LIPITOR) 40 MG tablet TAKE 1 TABLET BY MOUTH DAILY 12/23/21   Ronnie Doss M, DO  ?b complex vitamins tablet Take 1 tablet by mouth daily.    [provider]  ?Blood Glucose Monitoring Suppl (CONTOUR NEXT ONE) KIT Test BS QID and as needed Dx E11.9 06/04/20   [name], DO  ?Cholecalciferol (VITAMIN D3) 2000 UNITS capsule Take 2,000 Units by mouth daily.    [provider]  ?Coenzyme Q10 (COQ10) 200 MG CAPS Take 200 mg by mouth daily.    [provider]  ?Continuous Blood Gluc Receiver (FREESTYLE LIBRE READER) DEVI 1 applicator by Does not apply route as directed. 08/10/17   Chipper Herb, MD  ?Continuous Blood Gluc Sensor (FREESTYLE LIBRE SENSOR SYSTEM) MISC Check BS eight (8) times a day. Dx E10.9 06/02/21   Ronnie Doss M, DO  ?diltiazem (CARDIZEM) 30 MG tablet Take 1 tablet (30 mg total) by mouth 2  (two) times daily. 04/27/21   Minus Breeding, MD  ?furosemide (LASIX) 20 MG tablet TAKE 1 AND 1/2 TABLETS BY MOUTH  DAILY 12/23/21   [name], DO  ?Glucagon, rDNA, (GLUCAGON EMERGENCY) 1 MG KIT INJECT AS DIRECTED INTO  UPPER ARM, THIGH OR  BUTTOCKS AS NEEDED FOR  SEVERE HYPOGLYCEMIA. SEEK  MEDICAL ATTENTION AFTER USE 06/14/21   Loman Brooklyn, FNP  ?Glucosamine-Chondroit-Vit C-Mn (GLUCOSAMINE 1500 COMPLEX) CAPS Take 2 capsules by mouth daily.    [provider]  ?glucose blood (CONTOUR NEXT TEST) test strip Test BS QID and as needed Dx E11.9 11/08/21   [name], DO  ?insulin glargine, 2 Unit Dial, (TOUJEO MAX SOLOSTAR) 300 UNIT/ML Solostar Pen Inject 50-60 Units into the skin daily. 01/05/22   [name], DO  ?insulin lispro (HUMALOG) 100 UNIT/ML injection INJECT SUBCUTANEOUSLY 20 TO 50 UNITS  3 TIMES DAILY  BEFORE MEALS PER SLIDING  SCALE 11/08/21   Ronnie Doss M, DO  ?magnesium oxide (MAG-OX) 400 MG tablet Take 400 mg by mouth daily.    [provider]  ?Microlet Lancets MISC Test BS QID and as needed Dx E11.9 06/04/20   Ronnie Doss M, DO  ?montelukast (SINGULAIR) 10 MG tablet TAKE 1 TABLET BY MOUTH AT  BEDTIME 09/30/21   Ronnie Doss M, DO  ?multivitamin-lutein (OCUVITE-LUTEIN) CAPS capsule Take 1 capsule by mouth daily.    [provider]  ?pantoprazole (PROTONIX) 40 MG tablet Take 1 tablet (40 mg total) by mouth 2 (two) times daily before a meal. 07/07/21   Pyrtle, Lajuan Lines, MD  ?Testosterone 30 MG/ACT SOLN APPLY ONE PUMP TOPICALLY  UNDER EACH AXILLA DAILY  (TOTAL OF 60MG DAILY ) 09/18/20   [name], DO  ?Turmeric Curcumin 500 MG CAPS Take 500 mg by mouth daily.    [provider]  ?valsartan (DIOVAN) 40 MG tablet Take 1 tablet (40 mg total) by mouth daily. 01/05/22   [name], DO  ? ? ?Physical Exam  ?  ?Vital Signs:  SAAHIL HERBSTER does not have vital signs available for review today. ? ?Given telephonic nature of  communication, physical exam is limited. ?AAOx3. NAD. Normal affect.  Speech and respirations are unlabored. ? ?Accessory Clinical Findings  ?  ?None ? ?Assessment & Plan  ?  ?1.  Preoperative Cardiovascular Risk As

## 2022-01-10 ENCOUNTER — Other Ambulatory Visit: Payer: Medicare Other

## 2022-01-10 ENCOUNTER — Encounter: Payer: Self-pay | Admitting: Family Medicine

## 2022-01-10 DIAGNOSIS — Z1211 Encounter for screening for malignant neoplasm of colon: Secondary | ICD-10-CM | POA: Diagnosis not present

## 2022-01-10 NOTE — Addendum Note (Signed)
Addended by: Everlean Cherry on: 01/10/2022 11:30 AM ? ? Modules accepted: Orders ? ?

## 2022-01-11 LAB — FECAL OCCULT BLOOD, IMMUNOCHEMICAL: Fecal Occult Bld: POSITIVE — AB

## 2022-01-12 ENCOUNTER — Telehealth: Payer: BC Managed Care – PPO | Admitting: Physician Assistant

## 2022-01-12 DIAGNOSIS — J069 Acute upper respiratory infection, unspecified: Secondary | ICD-10-CM | POA: Diagnosis not present

## 2022-01-12 MED ORDER — BENZONATATE 100 MG PO CAPS: 100.0000 mg | ORAL_CAPSULE | Freq: Three times a day (TID) | ORAL | 0 refills | Status: DC | PRN

## 2022-01-12 NOTE — Progress Notes (Signed)
I have spent 5 minutes in review of e-visit questionnaire, review and updating patient chart, medical decision making and response to patient.   Devlyn Cody Ashlie Mcmenamy, PA-C    

## 2022-01-12 NOTE — Progress Notes (Signed)
We are sorry that you are not feeling well.  Here is how we plan to help! ? ?Based on your presentation I believe you most likely have A cough due to a virus.  This is called viral bronchitis and is best treated by rest, plenty of fluids and control of the cough.  You may use Ibuprofen or Tylenol as directed to help your symptoms.   ?  ?In addition you may use A prescription cough medication called Tessalon Perles 120m. You may take 1-2 capsules every 8 hours as needed for your cough. ? ? ?From your responses in the eVisit questionnaire you describe inflammation in the upper respiratory tract which is causing a significant cough.  This is commonly called Bronchitis and has four common causes:   ?Allergies ?Viral Infections ?Acid Reflux ?Bacterial Infection ?Allergies, viruses and acid reflux are treated by controlling symptoms or eliminating the cause. An example might be a cough caused by taking certain blood pressure medications. You stop the cough by changing the medication. Another example might be a cough caused by acid reflux. Controlling the reflux helps control the cough. ? ?USE OF BRONCHODILATOR ("RESCUE") INHALERS: ?There is a risk from using your bronchodilator too frequently.  The risk is that over-reliance on a medication which only relaxes the muscles surrounding the breathing tubes can reduce the effectiveness of medications prescribed to reduce swelling and congestion of the tubes themselves.  Although you feel brief relief from the bronchodilator inhaler, your asthma may actually be worsening with the tubes becoming more swollen and filled with mucus.  This can delay other crucial treatments, such as oral steroid medications. If you need to use a bronchodilator inhaler daily, several times per day, you should discuss this with your provider.  There are probably better treatments that could be used to keep your asthma under control.  ?   ?HOME CARE ?Only take medications as instructed by your  medical team. ?Complete the entire course of an antibiotic. ?Drink plenty of fluids and get plenty of rest. ?Avoid close contacts especially the very young and the elderly ?Cover your mouth if you cough or cough into your sleeve. ?Always remember to wash your hands ?A steam or ultrasonic humidifier can help congestion.  ? ?GET HELP RIGHT AWAY IF: ?You develop worsening fever. ?You become short of breath ?You cough up blood. ?Your symptoms persist after you have completed your treatment plan ?MAKE SURE YOU  ?Understand these instructions. ?Will watch your condition. ?Will get help right away if you are not doing well or get worse. ?  ? ?Thank you for choosing an e-visit. ? ?Your e-visit answers were reviewed by a board certified advanced clinical practitioner to complete your personal care plan. Depending upon the condition, your plan could have included both over the counter or prescription medications. ? ?Please review your pharmacy choice. Make sure the pharmacy is open so you can pick up prescription now. If there is a problem, you may contact your provider through [employer] have the prescription routed to another pharmacy.  Your safety is important to [place] If you have drug allergies check your prescription carefully.  ? ?For the next 24 hours you can use MyChart to ask questions about today's visit, request a non-urgent call back, or ask for a work or school excuse. ?You will get an email in the next two days asking about your experience. I hope that your e-visit has been valuable and will speed your recovery. ? ?

## 2022-01-13 ENCOUNTER — Encounter: Payer: Self-pay | Admitting: *Deleted

## 2022-01-13 ENCOUNTER — Other Ambulatory Visit (HOSPITAL_BASED_OUTPATIENT_CLINIC_OR_DEPARTMENT_OTHER): Payer: Self-pay | Admitting: General Practice

## 2022-01-13 NOTE — Telephone Encounter (Signed)
Prescription refill request for Eliquis received. Indication:Afib Last office visit:5/23 Scr:0.9 Age: 71 Weight:104.7 kg  Prescription refilled

## 2022-01-15 DIAGNOSIS — R062 Wheezing: Secondary | ICD-10-CM | POA: Diagnosis not present

## 2022-01-15 DIAGNOSIS — J40 Bronchitis, not specified as acute or chronic: Secondary | ICD-10-CM | POA: Diagnosis not present

## 2022-01-16 ENCOUNTER — Encounter: Payer: Self-pay | Admitting: Family Medicine

## 2022-01-17 ENCOUNTER — Ambulatory Visit (INDEPENDENT_AMBULATORY_CARE_PROVIDER_SITE_OTHER): Payer: BC Managed Care – PPO | Admitting: Internal Medicine

## 2022-01-17 ENCOUNTER — Encounter: Payer: Self-pay | Admitting: Internal Medicine

## 2022-01-17 VITALS — BP 104/60 | HR 85 | Ht 68.0 in | Wt 230.2 lb

## 2022-01-17 DIAGNOSIS — K7460 Unspecified cirrhosis of liver: Secondary | ICD-10-CM

## 2022-01-17 DIAGNOSIS — K766 Portal hypertension: Secondary | ICD-10-CM

## 2022-01-17 DIAGNOSIS — Z86010 Personal history of colonic polyps: Secondary | ICD-10-CM

## 2022-01-17 DIAGNOSIS — K760 Fatty (change of) liver, not elsewhere classified: Secondary | ICD-10-CM

## 2022-01-17 DIAGNOSIS — R195 Other fecal abnormalities: Secondary | ICD-10-CM

## 2022-01-17 NOTE — Patient Instructions (Signed)
Your provider has requested that you complete hemoccult cards.Please bring these back to the lab upon completion.  Continue pantoprazole 40 mg daily.  You will be due for abdominal ultrasound in August. We will contact you with an appointment when it gets a bit closer to that time.  If you are age 72 or older, your body mass index should be between 23-30. Your Body mass index is 35 kg/m. If this is out of the aforementioned range listed, please consider follow up with your Primary Care Provider.  If you are age 74 or younger, your body mass index should be between 19-25. Your Body mass index is 35 kg/m. If this is out of the aformentioned range listed, please consider follow up with your Primary Care Provider.   ________________________________________________________  The [facility] providers would like to encourage you to use [facility] to communicate with providers for non-urgent requests or questions.  Due to long hold times on the telephone, sending your provider a message by [facility] may be a faster and more efficient way to get a response.  Please allow 48 business hours for a response.  Please remember that this is for non-urgent requests.  _______________________________________________________  Due to recent changes in healthcare laws, you may see the results of your imaging and laboratory studies on MyChart before your provider has had a chance to review them.  We understand that in some cases there may be results that are confusing or concerning to you. Not all laboratory results come back in the same time frame and the provider may be waiting for multiple results in order to interpret others.  Please give [place] 48 hours in order for your provider to thoroughly review all the results before contacting the office for clarification of your results.

## 2022-01-18 ENCOUNTER — Encounter: Payer: Self-pay | Admitting: Internal Medicine

## 2022-01-18 NOTE — Progress Notes (Signed)
Subjective:    Patient ID: Tony Cantu, male    DOB: 20-Mar-1951, 71 y.o.   MRN: [id number]  HPI Tony Cantu is a 71 year old male with a history of NAFLD cirrhosis with history of portal hypertension, history of colonic polyps, history of C. difficile colitis, atrial fibrillation and aortic stenosis on Eliquis, hypertension, diabetes, sleep apnea with inspire device who is here to discuss heme positive stool.  He was last seen in the office in September 2022 and for upper endoscopy in November 2022.  He reports overall he is feeling well.  He did have a heme positive stool on 01/10/2022.  He reports he has not seen any visible blood in stool or melena.  He has not had abdominal pain.  He denies issues with frequent heartburn, dysphagia or odynophagia.  No nausea or vomiting.  He had an inspire device implanted for his sleep apnea.  Bowel movements remain regular.  He is continue pantoprazole 40 mg twice daily.  He also continues Eliquis and he is off aspirin.  His biggest complaint continues to be bilateral knee pain and he is considering right total knee replacement within the next several months.   Review of Systems As per HPI, otherwise negative  Current Medications, Allergies, Past Medical History, Past Surgical History, Family History and Social History were reviewed in [employer] record.    Objective:   Physical Exam BP 104/60   Pulse 85   Ht 5' 8"  (1.727 m)   Wt 230 lb 3.2 oz (104.4 kg)   BMI 35.00 kg/m  Gen: awake, alert, NAD HEENT: anicteric  CV: RRR, no mrg Pulm: CTA b/l Abd: soft, NT/ND, +BS throughout Ext: no c/c/e Neuro: nonfocal     Latest Ref Rng & Units 12/10/2021   11:18 AM 11/03/2021    8:42 AM 09/28/2021   10:48 AM  CBC  WBC 4.0 - 10.5 K/uL 7.2   7.2   7.6    Hemoglobin 13.0 - 17.0 g/dL 16.6   17.1   17.6    Hematocrit 39.0 - 52.0 % 49.7   49.9   51.2    Platelets 150 - 400 K/uL 107   100   122     Lab Results  Component Value  Date   INR 1.1 (H) 05/27/2021   INR 1.1 02/27/2019   INR 1.0 10/25/2018   CMP     Component Value Date/Time   NA 140 10/20/2021 1245   K 3.4 (L) 10/20/2021 1245   CL 103 10/20/2021 1245   CO2 22 10/20/2021 1245   GLUCOSE 100 (H) 10/20/2021 1245   GLUCOSE 81 09/28/2021 1048   BUN 11 10/20/2021 1245   CREATININE 1.00 10/20/2021 1245   CREATININE 0.82 01/07/2013 0840   CALCIUM 9.7 10/20/2021 1245   PROT 6.6 10/20/2021 1245   ALBUMIN 3.7 10/20/2021 1245   AST 39 10/20/2021 1245   ALT 31 10/20/2021 1245   ALKPHOS 96 10/20/2021 1245   BILITOT 0.8 10/20/2021 1245   GFRNONAA >60 09/28/2021 1048   GFRNONAA >89 01/07/2013 0840   GFRAA 100 09/07/2020 0920   GFRAA >89 01/07/2013 0840   CT ABDOMEN AND PELVIS WITH CONTRAST   TECHNIQUE: Multidetector CT imaging of the abdomen and pelvis was performed using the standard protocol following bolus administration of intravenous contrast.   CONTRAST:  70m OMNIPAQUE IOHEXOL 350 MG/ML SOLN, additional oral enteric contrast   COMPARISON:  CT abdomen pelvis, 11/22/2016, MR abdomen, 04/02/2021   FINDINGS: Lower chest:  Aortic valve calcifications. Trace left pleural effusion.   Hepatobiliary: Contour of the liver. No solid liver abnormality is seen. Coarse, nodular no gallstones, gallbladder [place] thickening, or biliary dilatation.   Pancreas: Unremarkable. No pancreatic ductal dilatation or surrounding inflammatory changes.   Spleen: Normal in size without significant abnormality.   Adrenals/Urinary Tract: Adrenal glands are unremarkable. Small nonobstructive calculus of the superior pole left kidney. No right-sided calculi, ureteral calculi, or hydronephrosis. Bladder is unremarkable.   Stomach/Bowel: Gastroesophageal varices (series 223. Appendix appears normal. No evidence of bowel Lardizabal thickening, distention, or inflammatory changes. Descending and sigmoid diverticulosis.   Vascular/Lymphatic: Aortic atherosclerosis. No  enlarged abdominal or pelvic lymph nodes.   Reproductive: No mass or other significant abnormality.   Other: No abdominal [place] hernia or abnormality. No abdominopelvic ascites.   Musculoskeletal: No acute or significant osseous findings.   IMPRESSION: 1. No acute CT findings of the abdomen or pelvis to explain diarrhea. 2. Descending and sigmoid diverticulosis without evidence of acute diverticulitis. 3. Stigmata of cirrhosis and portal hypertension, including gastroesophageal varices. 4. Nonobstructive left nephrolithiasis. 5. Aortic valve calcifications. Correlate for echocardiographic evidence of aortic valve dysfunction.   Aortic Atherosclerosis (ICD10-I70.0).     Electronically Signed   By: Eddie Candle M.D.   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 difficile colitis, atrial fibrillation and aortic stenosis on Eliquis, hypertension, diabetes, sleep apnea with inspire device who is here to discuss heme positive stool.    Heme positive stool --no overt bleeding and no anemia.  He does take Eliquis daily and he has a history of portal gastropathy.  My suspicion is that his heme positive stool is secondary to portal [place] setting of anticoagulation.  That said we discussed how endoscopic evaluation would be pursued if ongoing heme positive stool or drop in blood count.  Certainly if any overt bleeding.  He will be due surveillance colonoscopy later this year.  He had an endoscopy 6 months ago where he did not have varices but did have portal gastropathy and gastritis with healed ulceration.  H. pylori negative. --Repeat Hemoccult x3 now --If positive proceed with repeat upper endoscopy and colonoscopy --Continue pantoprazole 40 mg twice daily  2.  Cirrhosis likely related to NAFLD with portal hypertension --his liver disease has  remained very well compensated.  There is no evidence of jaundice, ascites, encephalopathy.  He is up-to-date with variceal screening and he did not have varices in the esophagus or stomach despite seeing some intra-abdominal varices by imaging last September.  Risk factor modification remains important to control hypertension hypercholesterolemia and blood sugars.  He would like to be more active but is limited by knee pain. --[place] screening; up-to-date but would recommend repeat ultrasound in August of this year --Variceal screening up-to-date with EGD as of November 2022; he is not on beta-blocker thus repeat upper endoscopy would be recommended around November 2024 unless done earlier see #1 --No evidence for ascites or encephalopathy --Continue pantoprazole 40 mg twice daily for history of gastritis with ulcer and portal gastropathy  3.  History of gastric ulcer --continue twice daily PPI, H. pylori negative and avoid NSAIDs  4.  History of colonic polyps --surveillance colonoscopy recommended in August of this year; this may be performed after knee replacement or sooner depending on repeat Hemoccult: See #1  60-monthfollow-up  40 minutes total spent today including patient facing time, coordination of care, reviewing medical history/procedures/pertinent radiology studies, and documentation of the encounter.

## 2022-01-20 ENCOUNTER — Other Ambulatory Visit: Payer: Self-pay | Admitting: Family Medicine

## 2022-01-20 DIAGNOSIS — E1122 Type 2 diabetes mellitus with diabetic chronic kidney disease: Secondary | ICD-10-CM

## 2022-01-21 ENCOUNTER — Other Ambulatory Visit: Payer: Self-pay | Admitting: Family Medicine

## 2022-01-21 DIAGNOSIS — E1122 Type 2 diabetes mellitus with diabetic chronic kidney disease: Secondary | ICD-10-CM

## 2022-01-25 ENCOUNTER — Encounter: Payer: Self-pay | Admitting: Pulmonary Disease

## 2022-01-27 ENCOUNTER — Other Ambulatory Visit (INDEPENDENT_AMBULATORY_CARE_PROVIDER_SITE_OTHER): Payer: BC Managed Care – PPO

## 2022-01-27 DIAGNOSIS — K7460 Unspecified cirrhosis of liver: Secondary | ICD-10-CM

## 2022-01-27 DIAGNOSIS — R195 Other fecal abnormalities: Secondary | ICD-10-CM

## 2022-01-27 LAB — HEMOCCULT SLIDES (X 3 CARDS)
Fecal Occult Blood: NEGATIVE
OCCULT 1: NEGATIVE
OCCULT 2: NEGATIVE
OCCULT 3: NEGATIVE
OCCULT 4: NEGATIVE
OCCULT 5: NEGATIVE

## 2022-01-31 ENCOUNTER — Encounter (INDEPENDENT_AMBULATORY_CARE_PROVIDER_SITE_OTHER): Payer: BC Managed Care – PPO | Admitting: Family Medicine

## 2022-01-31 DIAGNOSIS — I152 Hypertension secondary to endocrine disorders: Secondary | ICD-10-CM

## 2022-01-31 DIAGNOSIS — E1159 Type 2 diabetes mellitus with other circulatory complications: Secondary | ICD-10-CM

## 2022-01-31 DIAGNOSIS — R262 Difficulty in walking, not elsewhere classified: Secondary | ICD-10-CM | POA: Diagnosis not present

## 2022-01-31 DIAGNOSIS — M1711 Unilateral primary osteoarthritis, right knee: Secondary | ICD-10-CM | POA: Diagnosis not present

## 2022-01-31 DIAGNOSIS — M25561 Pain in right knee: Secondary | ICD-10-CM | POA: Diagnosis not present

## 2022-01-31 MED ORDER — VALSARTAN 40 MG PO TABS: 20.0000 mg | ORAL_TABLET | Freq: Every day | ORAL | 3 refills | Status: DC

## 2022-01-31 NOTE — Telephone Encounter (Signed)

## 2022-02-03 DIAGNOSIS — E1122 Type 2 diabetes mellitus with diabetic chronic kidney disease: Secondary | ICD-10-CM | POA: Diagnosis not present

## 2022-02-03 DIAGNOSIS — Z794 Long term (current) use of insulin: Secondary | ICD-10-CM | POA: Diagnosis not present

## 2022-02-03 DIAGNOSIS — N182 Chronic kidney disease, stage 2 (mild): Secondary | ICD-10-CM | POA: Diagnosis not present

## 2022-02-14 ENCOUNTER — Encounter: Payer: Self-pay | Admitting: Pulmonary Disease

## 2022-02-14 ENCOUNTER — Ambulatory Visit (INDEPENDENT_AMBULATORY_CARE_PROVIDER_SITE_OTHER): Payer: BC Managed Care – PPO | Admitting: Pulmonary Disease

## 2022-02-14 VITALS — BP 138/64 | HR 80 | Temp 98.3°F | Ht 68.0 in | Wt 229.4 lb

## 2022-02-14 DIAGNOSIS — G4733 Obstructive sleep apnea (adult) (pediatric): Secondary | ICD-10-CM | POA: Diagnosis not present

## 2022-02-14 DIAGNOSIS — Z01811 Encounter for preprocedural respiratory examination: Secondary | ICD-10-CM | POA: Diagnosis not present

## 2022-02-14 NOTE — Patient Instructions (Signed)
Will arrange for in lab sleep study to titrate your Inspire device  Follow up in 8 to 10 weeks

## 2022-02-14 NOTE — Progress Notes (Signed)
[facility]  Chief Complaint  Patient presents with   Follow-up    Follow up. Patient has no complaints.     Past Surgical History:  He  has a past surgical history that includes Lumbar disc surgery (03/1995); Inguinal hernia repair (2003); Shoulder surgery (11/28/2005); Shoulder surgery (07/14/2006); Carpal tunnel release (Right, 10/13/2015); Knee arthroscopy (Right, 03/03/2016); Carpal tunnel release (Left, 07/05/2016); Lumbar laminectomy/decompression microdiscectomy (Right, 10/06/2016); Back surgery; Tonsillectomy (AGE 73 OR 4); Total shoulder arthroplasty (Right, 11/01/2018); Drug induced endoscopy (N/A, 07/02/2021); and Implantation of hypoglossal nerve stimulator (Right, 10/01/2021).  Past Medical History:  TMJ, Aortic stenosis, OA, BPH, Cirrhosis, Colon polyps, Diabetes, A fib, PUD, GERD, Nephrolithiasis, Hypertension, C diff colitis  Constitutional:  BP 138/64 (BP Location: Right Arm, Patient Position: Sitting, Cuff Size: Normal)   Pulse 80   Temp 98.3 F (36.8 C) (Oral)   Ht _0  (1.727 m)   Wt 229 lb 6.4 oz (104.1 kg)   SpO2 94%   BMI 34.88 kg/m   Brief Summary:  Tony Cantu is a 71 y.o. male with obstructive sleep apnea.  He had Inspire device implanted 10/01/21.      Subjective:   He has been doing well since he got Inspire device.  He had to decrease setting some last week because he had jaw vibration.  Not a problem since.  He feels rested during the day.  Download shows average 7 hrs 41 min on per night.  He is scheduled to have knee surgery with Dr. Mayer Camel later this month.  Physical Exam:   Appearance - well kempt   ENMT - no sinus tenderness, no oral exudate, no LAN, Mallampati 3 airway, no stridor  Respiratory - equal breath sounds bilaterally, no wheezing or rales  CV - s1s2 regular rate and rhythm, 2/6  Ext - no clubbing, no edema  Skin - no rashes  Psych - normal mood and affect    Pulmonary  testing:  PFT 02/09/16 >> FEV1 2.38 (70%), FEV1% 84, TLC 4.96 (70%), DLCO 78%  Sleep Tests:  PSG 05/02/12 >> AHI 76 Home sleep study 04/07/21 >> AHI 49.3, SpO2 low 79%  Cardiac Tests:  Echo 09/22/21 >> EF 55 to 60%, grade 1 DD, mild LA dilation, mild MR, mod AS  Social History:  He  reports that he quit smoking about 18 years ago. His smoking use included cigarettes and pipe. He has never used smokeless tobacco. He reports that he does not currently use alcohol. He reports that he does not use drugs.  Family History:  His family history includes Allergic rhinitis in his sister; Alzheimer's disease in his mother; Asthma in his father; Breast cancer in his maternal grandmother; Diabetes in his mother; Heart attack in his maternal grandmother and mother; Heart failure in his maternal grandmother and mother; Rheum arthritis in his maternal grandmother; Suicidality in his father.     Assessment/Plan:   Obstructive sleep apnea. - he was not able to tolerate CPAP therapy - had Inspire device implanted 10/01/21 by Dr. Melida Quitter - will arrange for in lab Inspire titration study  Paroxysmal atrial fibrillation, Aortic stenosis. - followed by Dr. Minus Breeding with Cardiology  Knee pain. - he is scheduled for knee replacement with Dr. Frederik Pear - there are no pulmonary contra-indications for him to have surgery - he should resume using Inspire device when asleep after surgery  Time Spent Involved in Patient Care on Day of Examination:  37 minutes  Follow up:   Patient Instructions  Will arrange for in lab sleep study to titrate your Inspire device  Follow up in 8 to 10 weeks  Medication List:   Allergies as of 02/14/2022       Reactions   Erythromycin Diarrhea   Sulfonamide Derivatives Diarrhea        Medication List        Accurate as of February 14, 2022  4:54 PM. If you have any questions, ask your nurse or doctor.          Advair HFA 115-21 MCG/ACT  inhaler Generic drug: fluticasone-salmeterol Inhale 2 puffs into the lungs 2 (two) times daily.   albuterol 108 (90 Base) MCG/ACT inhaler Commonly known as: VENTOLIN HFA Inhale 2 puffs into the lungs every 6 (six) hours as needed for wheezing or shortness of breath.   atorvastatin 40 MG tablet Commonly known as: LIPITOR TAKE 1 TABLET BY MOUTH DAILY   b complex vitamins tablet Take 1 tablet by mouth daily.   benzonatate 100 MG capsule Commonly known as: TESSALON Take 1 capsule (100 mg total) by mouth 3 (three) times daily as needed for cough.   Contour Next One Kit Test BS QID and as needed Dx E11.9   Contour Next Test test strip Generic drug: glucose blood Test BS QID and as needed Dx E11.9   CoQ10 200 MG Caps Take 200 mg by mouth daily.   diltiazem 30 MG tablet Commonly known as: Cardizem Take 1 tablet (30 mg total) by mouth 2 (two) times daily.   Eliquis 5 MG Tabs tablet Generic drug: apixaban TAKE 1 TABLET BY MOUTH  TWICE DAILY   FreeStyle Libre Reader Devi 1 applicator by Does not apply route as directed.   FreeStyle [employer] Misc Check BS eight (8) times a day. Dx E10.9   furosemide 20 MG tablet Commonly known as: LASIX TAKE 1 AND 1/2 TABLETS BY MOUTH  DAILY   Glucagon Emergency 1 MG Kit INJECT AS DIRECTED INTO  UPPER ARM, THIGH OR  BUTTOCKS AS NEEDED FOR  SEVERE HYPOGLYCEMIA. SEEK  MEDICAL ATTENTION AFTER USE   Glucosamine 1500 Complex Caps Take 2 capsules by mouth daily.   insulin lispro 100 UNIT/ML injection Commonly known as: HumaLOG INJECT SUBCUTANEOUSLY 20 TO 50  UNITS 3 TIMES DAILY BEFORE MEALS PER SLIDING SCALE   magnesium oxide 400 MG tablet Commonly known as: MAG-OX Take 400 mg by mouth daily.   Microlet Lancets Misc Test BS QID and as needed Dx E11.9   montelukast 10 MG tablet Commonly known as: SINGULAIR TAKE 1 TABLET BY MOUTH AT  BEDTIME   multivitamin-lutein Caps capsule Take 1 capsule by mouth daily.   pantoprazole  40 MG tablet Commonly known as: PROTONIX Take 1 tablet (40 mg total) by mouth 2 (two) times daily before a meal.   Testosterone 30 MG/ACT Soln APPLY ONE PUMP TOPICALLY  UNDER EACH AXILLA DAILY  (TOTAL OF 60MG DAILY )   Toujeo Max SoloStar 300 UNIT/ML Solostar Pen Generic drug: insulin glargine (2 Unit Dial) INJECT SUBCUTANEOUSLY 110 UNITS  DAILY   Turmeric Curcumin 500 MG Caps Take 500 mg by mouth daily.   valsartan 40 MG tablet Commonly known as: DIOVAN Take 0.5 tablets (20 mg total) by mouth daily.   Vitamin D3 50 MCG (2000 UT) capsule Take 2,000 Units by mouth daily.        Signature:  Chesley Mires, MD [place] Pager - [phone] 02/14/2022,  4:54 PM

## 2022-02-16 DIAGNOSIS — M25561 Pain in right knee: Secondary | ICD-10-CM | POA: Diagnosis not present

## 2022-02-16 DIAGNOSIS — M1711 Unilateral primary osteoarthritis, right knee: Secondary | ICD-10-CM | POA: Diagnosis not present

## 2022-02-17 ENCOUNTER — Other Ambulatory Visit: Payer: Self-pay | Admitting: Pulmonary Disease

## 2022-02-23 ENCOUNTER — Other Ambulatory Visit: Payer: Self-pay | Admitting: Orthopedic Surgery

## 2022-02-28 ENCOUNTER — Ambulatory Visit: Payer: BC Managed Care – PPO | Admitting: Physical Therapy

## 2022-02-28 NOTE — Patient Instructions (Signed)
DUE TO COVID-19 ONLY TWO VISITORS  (aged 72 and older)  ARE ALLOWED TO COME WITH YOU AND STAY IN THE WAITING ROOM ONLY DURING PRE OP AND PROCEDURE.   **NO VISITORS ARE ALLOWED IN THE SHORT STAY AREA OR RECOVERY ROOM!!**  IF YOU WILL BE ADMITTED INTO THE HOSPITAL YOU ARE ALLOWED ONLY FOUR SUPPORT PEOPLE DURING VISITATION HOURS ONLY (7 AM -8PM)   The support person(s) must pass our screening, gel in and out, and wear a mask at all times, including in the patient's room. Patients must also wear a mask when staff or their support person are in the room. Visitors GUEST BADGE MUST BE WORN VISIBLY  One adult visitor may remain with you overnight and MUST be in the room by 8 P.M.     Your procedure is scheduled on: 03/07/22   Report to [facility] Main Entrance    Report to admitting at : 11:15 AM   Call this number if you have problems the morning of surgery [phone]   Do not eat food :After Midnight.   After Midnight you may have the following liquids until : 10:30 AM DAY OF SURGERY  Water Black Coffee (sugar ok, NO MILK/CREAM OR CREAMERS)  Tea (sugar ok, NO MILK/CREAM OR CREAMERS) regular and decaf                             Plain Jell-O (NO RED)                                           Fruit ices (not with fruit pulp, NO RED)                                     Popsicles (NO RED)                                                                  Juice: apple, WHITE grape, WHITE cranberry Sports drinks like Gatorade (NO RED) Clear broth(vegetable,chicken,beef)  Drink  G2 drink AT :10:30 AM the day of surgery.       The day of surgery:  Drink ONE (1) Pre-Surgery Clear Ensure or G2 at AM the morning of surgery. Drink in one sitting. Do not sip.  This drink was given to you during your hospital  pre-op appointment visit. Nothing else to drink after completing the  Pre-Surgery Clear Ensure or G2.          If you have questions, please contact your surgeon's office.     Oral Hygiene is also important to reduce your risk of infection.                                    Remember - BRUSH YOUR TEETH THE MORNING OF SURGERY WITH YOUR REGULAR TOOTHPASTE   Do NOT smoke after Midnight   Take these medicines the morning of surgery with A SIP OF WATER: diltiazem,pantoprazole.Use inhalers as usual. How  to Manage Your Diabetes Before and After Surgery  Why is it important to control my blood sugar before and after surgery? Improving blood sugar levels before and after surgery helps healing and can limit problems. A way of improving blood sugar control is eating a healthy diet by:  Eating less sugar and carbohydrates  Increasing activity/exercise  Talking with your doctor about reaching your blood sugar goals High blood sugars (greater than 180 mg/dL) can raise your risk of infections and slow your recovery, so you will need to focus on controlling your diabetes during the weeks before surgery. Make sure that the doctor who takes care of your diabetes knows about your planned surgery including the date and location.  How do I manage my blood sugar before surgery? Check your blood sugar at least 4 times a day, starting 2 days before surgery, to make sure that the level is not too high or low. Check your blood sugar the morning of your surgery when you wake up and every 2 hours until you get to the Short Stay unit. If your blood sugar is less than 70 mg/dL, you will need to treat for low blood sugar: Do not take insulin. Treat a low blood sugar (less than 70 mg/dL) with  cup of clear juice (cranberry or apple), 4 glucose tablets, OR glucose gel. Recheck blood sugar in 15 minutes after treatment (to make sure it is greater than 70 mg/dL). If your blood sugar is not greater than 70 mg/dL on recheck, call [phone] for further instructions. Report your blood sugar to the short stay nurse when you get to Short Stay.  If you are admitted to the hospital after  surgery: Your blood sugar will be checked by the staff and you will probably be given insulin after surgery (instead of oral diabetes medicines) to make sure you have good blood sugar levels. The goal for blood sugar control after surgery is 80-180 mg/dL.   WHAT DO I DO ABOUT MY DIABETES MEDICATION?  Do not take oral diabetes medicines (pills) the morning of surgery.  THE DAY BEFORE SURGERY, take lispro insulin as usual for breakfast and lunch,DO NOT take the bedtime dose of lispro. Take Toujeo inslin as usual in AM.      THE MORNING OF SURGERY, take half of the dose of toujeo insulin.  The day of surgery, If your CBG is greater than 220 mg/dL, you may take  of your sliding scale  (correction) dose of insulin.    DO NOT TAKE ANY ORAL DIABETIC MEDICATIONS DAY OF YOUR SURGERY  Bring CPAP mask and tubing day of surgery.                              You may not have any metal on your body including hair pins, jewelry, and body piercing             Do not wear make-up, lotions, powders, perfumes/cologne, or deodorant  Do not wear nail polish including gel and S&S, artificial/acrylic nails, or any other type of covering on natural nails including finger and toenails. If you have artificial nails, gel coating, etc. that needs to be removed by a nail salon please have this removed prior to surgery or surgery may need to be canceled/ delayed if the surgeon/ anesthesia feels like they are unable to be safely monitored.   Do not shave  48 hours prior to surgery.  Men may shave face and neck.   Do not bring valuables to the hospital. [place].   Contacts, dentures or bridgework may not be worn into surgery.   Bring small overnight bag day of surgery.   DO NOT [place]. PHARMACY WILL DISPENSE MEDICATIONS LISTED ON YOUR MEDICATION LIST TO YOU DURING YOUR ADMISSION [place]!    Patients  discharged on the day of surgery will not be allowed to drive home.  Someone NEEDS to stay with you for the first 24 hours after anesthesia.   Special Instructions: Bring a copy of your healthcare power of attorney and living will documents         the day of surgery if you haven't scanned them before.              Please read over the following fact sheets you were given: IF YOU HAVE QUESTIONS ABOUT YOUR PRE-OP INSTRUCTIONS PLEASE CALL [phone]     [facility] - Preparing for Surgery Before surgery, you can play an important role.  Because skin is not sterile, your skin needs to be as free of germs as possible.  You can reduce the number of germs on your skin by washing with CHG (chlorahexidine gluconate) soap before surgery.  CHG is an antiseptic cleaner which kills germs and bonds with the skin to continue killing germs even after washing. Please DO NOT use if you have an allergy to CHG or antibacterial soaps.  If your skin becomes reddened/irritated stop using the CHG and inform your nurse when you arrive at Short Stay. Do not shave (including legs and underarms) for at least 48 hours prior to the first CHG shower.  You may shave your face/neck. Please follow these instructions carefully:  1.  Shower with CHG Soap the night before surgery and the  morning of Surgery.  2.  If you choose to wash your hair, wash your hair first as usual with your  normal  shampoo.  3.  After you shampoo, rinse your hair and body thoroughly to remove the  shampoo.                           4.  Use CHG as you would any other liquid soap.  You can apply chg directly  to the skin and wash                       Gently with a scrungie or clean washcloth.  5.  Apply the CHG Soap to your body ONLY FROM THE NECK DOWN.   Do not use on face/ open                           Wound or open sores. Avoid contact with eyes, ears mouth and genitals (private parts).                       Wash face,  Genitals (private parts) with  your normal soap.             6.  Wash thoroughly, paying special attention to the area where your surgery  will be performed.  7.  Thoroughly rinse your body with warm water from the neck down.  8.  DO NOT shower/wash with your normal soap after using and rinsing off  the CHG Soap.                9.  Pat yourself dry with a clean towel.            10.  Wear clean pajamas.            11.  Place clean sheets on your bed the night of your first shower and do not  sleep with pets. Day of Surgery : Do not apply any lotions/deodorants the morning of surgery.  Please wear clean clothes to the hospital/surgery center.  FAILURE TO FOLLOW THESE INSTRUCTIONS MAY RESULT IN THE CANCELLATION OF YOUR SURGERY PATIENT SIGNATURE_________________________________  NURSE SIGNATURE__________________________________  ________________________________________________________________________   Adam Phenix  An incentive spirometer is a tool that can help keep your lungs clear and active. This tool measures how well you are filling your lungs with each breath. Taking long deep breaths may help reverse or decrease the chance of developing breathing (pulmonary) problems (especially infection) following: A long period of time when you are unable to move or be active. BEFORE THE PROCEDURE  If the spirometer includes an indicator to show your best effort, your nurse or respiratory therapist will set it to a desired goal. If possible, sit up straight or lean slightly forward. Try not to slouch. Hold the incentive spirometer in an upright position. INSTRUCTIONS FOR USE  Sit on the edge of your bed if possible, or sit up as far as you can in bed or on a chair. Hold the incentive spirometer in an upright position. Breathe out normally. Place the mouthpiece in your mouth and seal your lips tightly around it. Breathe in slowly and as deeply as possible, raising the piston or the ball toward the top of the  column. Hold your breath for 3-5 seconds or for as long as possible. Allow the piston or ball to fall to the bottom of the column. Remove the mouthpiece from your mouth and breathe out normally. Rest for a few seconds and repeat Steps 1 through 7 at least 10 times every 1-2 hours when you are awake. Take your time and take a few normal breaths between deep breaths. The spirometer may include an indicator to show your best effort. Use the indicator as a goal to work toward during each repetition. After each set of 10 deep breaths, practice coughing to be sure your lungs are clear. If you have an incision (the cut made at the time of surgery), support your incision when coughing by placing a pillow or rolled up towels firmly against it. Once you are able to get out of bed, walk around indoors and cough well. You may stop using the incentive spirometer when instructed by your caregiver.  RISKS AND COMPLICATIONS Take your time so you do not get dizzy or light-headed. If you are in pain, you may need to take or ask for pain medication before doing incentive spirometry. It is harder to take a deep breath if you are having pain. AFTER USE Rest and breathe slowly and easily. It can be helpful to keep track of a log of your progress. Your caregiver can provide you with a simple table to help with this. If you are using the spirometer at home, follow these instructions: [place] IF:  You are having difficultly using the spirometer. You have trouble using the spirometer as often as instructed. Your pain medication is not  giving enough relief while using the spirometer. You develop fever of 100.5 F (38.1 C) or higher. SEEK IMMEDIATE MEDICAL CARE IF:  You cough up bloody sputum that had not been present before. You develop fever of 102 F (38.9 C) or greater. You develop worsening pain at or near the incision site. MAKE SURE YOU:  Understand these instructions. Will watch your  condition. Will get help right away if you are not doing well or get worse. Document Released: 12/26/2006 Document Revised: 11/07/2011 Document Reviewed: 02/26/2007 [facility] Patient Information 2014 [place], [place].   ________________________________________________________________________

## 2022-03-01 ENCOUNTER — Other Ambulatory Visit: Payer: Self-pay | Admitting: Cardiology

## 2022-03-02 ENCOUNTER — Encounter (HOSPITAL_COMMUNITY)
Admission: RE | Admit: 2022-03-02 | Discharge: 2022-03-02 | Disposition: A | Discharge status: Home / Self Care | Payer: BC Managed Care – PPO | Source: Ambulatory Visit | Attending: Orthopedic Surgery | Admitting: Orthopedic Surgery

## 2022-03-02 ENCOUNTER — Encounter (HOSPITAL_COMMUNITY): Payer: Self-pay

## 2022-03-02 ENCOUNTER — Other Ambulatory Visit: Payer: Self-pay

## 2022-03-02 VITALS — BP 121/60 | HR 65 | Temp 97.8°F | Ht 68.0 in | Wt 224.0 lb

## 2022-03-02 DIAGNOSIS — E119 Type 2 diabetes mellitus without complications: Secondary | ICD-10-CM | POA: Insufficient documentation

## 2022-03-02 DIAGNOSIS — Z01818 Encounter for other preprocedural examination: Secondary | ICD-10-CM | POA: Insufficient documentation

## 2022-03-02 DIAGNOSIS — Z7901 Long term (current) use of anticoagulants: Secondary | ICD-10-CM | POA: Insufficient documentation

## 2022-03-02 DIAGNOSIS — K219 Gastro-esophageal reflux disease without esophagitis: Secondary | ICD-10-CM | POA: Diagnosis not present

## 2022-03-02 DIAGNOSIS — E1122 Type 2 diabetes mellitus with diabetic chronic kidney disease: Secondary | ICD-10-CM

## 2022-03-02 DIAGNOSIS — G4730 Sleep apnea, unspecified: Secondary | ICD-10-CM | POA: Diagnosis not present

## 2022-03-02 DIAGNOSIS — I10 Essential (primary) hypertension: Secondary | ICD-10-CM | POA: Insufficient documentation

## 2022-03-02 DIAGNOSIS — M1711 Unilateral primary osteoarthritis, right knee: Secondary | ICD-10-CM | POA: Insufficient documentation

## 2022-03-02 DIAGNOSIS — I152 Hypertension secondary to endocrine disorders: Secondary | ICD-10-CM

## 2022-03-02 DIAGNOSIS — I4891 Unspecified atrial fibrillation: Secondary | ICD-10-CM | POA: Diagnosis not present

## 2022-03-02 HISTORY — DX: Cardiac murmur, unspecified: R01.1

## 2022-03-02 LAB — BASIC METABOLIC PANEL
Anion gap: 6 (ref 5–15)
BUN: 14 mg/dL (ref 8–23)
CO2: 28 mmol/L (ref 22–32)
Calcium: 10 mg/dL (ref 8.9–10.3)
Chloride: 108 mmol/L (ref 98–111)
Creatinine, Ser: 0.93 mg/dL (ref 0.61–1.24)
GFR, Estimated: >60 mL/min (ref >60)
Glucose, Bld: 86 mg/dL (ref 70–99)
Potassium: 4.1 mmol/L (ref 3.5–5.1)
Sodium: 142 mmol/L (ref 135–145)

## 2022-03-02 LAB — CBC
HCT: 51.7 % (ref 39.0–52.0)
Hemoglobin: 17.7 g/dL — ABNORMAL HIGH (ref 13.0–17.0)
MCH: 35.8 pg — ABNORMAL HIGH (ref 26.0–34.0)
MCHC: 34.2 g/dL (ref 30.0–36.0)
MCV: 104.4 fL — ABNORMAL HIGH (ref 80.0–100.0)
Platelets: 104 10*3/uL — ABNORMAL LOW (ref 150–400)
RBC: 4.95 MIL/uL (ref 4.22–5.81)
RDW: 14.9 % (ref 11.5–15.5)
WBC: 6.3 10*3/uL (ref 4.0–10.5)
nRBC: 0 % (ref 0.0–0.2)

## 2022-03-02 LAB — SURGICAL PCR SCREEN
MRSA, PCR: NEGATIVE
Staphylococcus aureus: NEGATIVE

## 2022-03-02 LAB — GLUCOSE, CAPILLARY: Glucose-Capillary: 113 mg/dL — ABNORMAL HIGH (ref 70–99)

## 2022-03-02 NOTE — Progress Notes (Signed)
For Short Stay: [place] appointment date: Date of COVID positive in last 28 days: NO  Bowel Prep reminder:   For Anesthesia: PCP - DO: Adam Phenix. Cardiologist - Dr. Minus Breeding. LOV: 01/06/22. Clearance: Coletta Memos: NP: 01/06/22: Epic. Chest x-ray -  EKG - 04/21/21 Stress Test -  ECHO - 09/22/21 Cardiac Cath -  Pacemaker/ICD device last checked: Pacemaker orders received: Device Rep notified:  Spinal Cord Stimulator: Implant: Inspire for sleep apnea. Pt. Will bring the control. Sleep Study - Yes CPAP - NO  Fasting Blood Sugar - 40's - 120's Checks Blood Sugar: continuous. Date and result of last Hgb A1c-4.4: 01/04/22  Blood Thinner Instructions: Eliquis: will be hold 3 days before surgery: Dr. Mayer Camel Aspirin Instructions: Last Dose:  Activity level: Can go up a flight of stairs and activities of daily living without stopping and without chest pain and/or shortness of breath   Able to exercise without chest pain and/or shortness of breath   Unable to go up a flight of stairs without chest pain and/or shortness of breath     Anesthesia review: Hx: Afib,DIA,HTN,Heart murmur.,seizure(more than a year ago,due to hypoglycemia),OSA( implant)  Patient denies shortness of breath, fever, cough and chest pain at PAT appointment   Patient verbalized understanding of instructions that were given to them at the PAT appointment. Patient was also instructed that they will need to review over the PAT instructions again at home before surgery.

## 2022-03-03 ENCOUNTER — Encounter: Payer: BC Managed Care – PPO | Admitting: *Deleted

## 2022-03-03 NOTE — Anesthesia Preprocedure Evaluation (Addendum)
Anesthesia Evaluation  Patient identified by MRN, date of birth, ID band Patient awake    Reviewed: Allergy & Precautions, NPO status , Patient's Chart, lab work & pertinent test results  History of Anesthesia Complications (+) PONV and history of anesthetic complications  Airway Mallampati: II  TM Distance: >3 FB     Dental   Pulmonary former smoker,    breath sounds clear to auscultation       Cardiovascular hypertension, + dysrhythmias  Rhythm:Regular Rate:Normal     Neuro/Psych    GI/Hepatic PUD, GERD  ,  Endo/Other  diabetes  Renal/GU Renal disease     Musculoskeletal   Abdominal   Peds  Hematology   Anesthesia Other Findings   Reproductive/Obstetrics                            Anesthesia Physical Anesthesia Plan  ASA: 3  Anesthesia Plan: General   Post-op Pain Management: Regional block*   Induction: Intravenous  PONV Risk Score and Plan: 2 and Ondansetron, Dexamethasone and Midazolam  Airway Management Planned: Oral ETT  Additional Equipment:   Intra-op Plan:   Post-operative Plan: Extubation in OR  Informed Consent: I have reviewed the patients History and Physical, chart, labs and discussed the procedure including the risks, benefits and alternatives for the proposed anesthesia with the patient or authorized representative who has indicated his/her understanding and acceptance.     Dental advisory given  Plan Discussed with: Anesthesiologist and CRNA  Anesthesia Plan Comments: (See PAT note 03/02/2022)     Anesthesia Quick Evaluation

## 2022-03-03 NOTE — Progress Notes (Signed)
Anesthesia Chart Review   Case: [id number] Date/Time: 03/07/22 1330   Procedure: RIGHT TOTAL KNEE ARTHROPLASTY (Right: Knee)   Anesthesia type: Spinal   Pre-op diagnosis: RIGHT KNEE OSTEOARTHRITIS   Location: [place] [facility] / [facility]   Surgeons: Frederik Pear, MD       DISCUSSION:71 y.o. former smoker with h/o PONV, GERD, HTN, insulin dependent DM II, sleep apnea with CPAP, A-fib (Eliquis), moderate AS (valve area 1.28 cm2, mean gradient 23.5 mmHg), right knee OA scheduled for above procedure 03/07/2022 with Dr. Frederik Pear.   Hypoglossal nerve stimulutor implanted 10/01/21.   Last seen by pulmonologist 02/14/2022.  Per OV note, "there are no pulmonary contra-indications for him to have surgery. He should resume using Inspire device when asleep after surgery"  Last seen by cardiology 01/06/2022. Per OV note, "Chart reviewed as part of pre-operative protocol coverage. Given past medical history and time since last visit, based on ACC/AHA guidelines, Tony Cantu would be at acceptable risk for the planned procedure without further cardiovascular testing.    Patient was advised that if he develops new symptoms prior to surgery to contact our office to arrange a follow-up appointment.  He verbalized understanding.   His RCRI is a class III risk, 6.6% risk of major cardiac event.  He is able to complete greater than 4 METS of physical activity."  Advised to hold Eliquis 3 days prior to procedure.   Anticipate pt can proceed with planned procedure barring acute status change.   VS: BP 121/60   Pulse 65   Temp 36.6 C (Oral)   Ht 5' 8"  (1.727 m)   Wt 101.6 kg   SpO2 98%   BMI 34.06 kg/m   PROVIDERS: [name], DO is PCP   Minus Breeding, MD is Cardiologist  LABS: Labs reviewed: Acceptable for surgery. (all labs ordered are listed, but only abnormal results are displayed)  Labs Reviewed  CBC - Abnormal; Notable for the following components:      Result Value   Hemoglobin 17.7  (*)    MCV 104.4 (*)    MCH 35.8 (*)    Platelets 104 (*)    All other components within normal limits  GLUCOSE, CAPILLARY - Abnormal; Notable for the following components:   Glucose-Capillary 113 (*)    All other components within normal limits  SURGICAL PCR SCREEN  BASIC METABOLIC PANEL  TYPE AND SCREEN     IMAGES:   EKG:    CV: Echo 09/22/2021 1. Left ventricular ejection fraction, by estimation, is 55 to 60%. The  left ventricle has normal function. The left ventricle demonstrates  regional [place] motion abnormalities (see scoring diagram/findings for  description). Left ventricular diastolic  parameters are consistent with Grade I diastolic dysfunction (impaired  relaxation).   2. Right ventricular systolic function is normal. The right ventricular  size is normal. Tricuspid regurgitation signal is inadequate for assessing  PA pressure.   3. Left atrial size was mildly dilated.   4. The mitral valve is degenerative. Mild mitral valve regurgitation.   5. The aortic valve is tricuspid. There is moderate calcification of the  aortic valve. Aortic valve regurgitation is mild. Moderate aortic valve  stenosis. Aortic valve area, by VTI measures 1.28 cm. Aortic valve mean  gradient measures 23.5 mmHg.  Dimentionless index 0.41.   6. The inferior vena cava is dilated in size with >50% respiratory  variability, suggesting right atrial pressure of 8 mmHg.  Past Medical History:  Diagnosis Date  Aortic atherosclerosis (HCC)    Aortic stenosis    Arthritis    Asthma    BPH (benign prostatic hypertrophy)    Cirrhosis ([place]) 2016   Colon polyps    Diabetes mellitus without complication ([place])    TYPE 1    Diverticulosis    Dysrhythmia 04/19/2021   A-fib noted with possible A-fib RVR   Gastric ulcer    Gastric varices    right   Gastritis    GERD (gastroesophageal reflux disease)    Headache    History- pt states these were migraines that occurred in the 1980's   Heart  murmur    History of kidney stones    Hx of adenomatous colonic polyps    Hypertension    PONV (postoperative nausea and vomiting)    pt states he has never had post op nausea or vomiting   Portal hypertensive gastropathy (HCC)    Renal cyst, right    Seizures (HCC)    HYPOGLYCEMIC LAST 1 AND 1/2 YRS AGO   Sleep apnea    uses CPAP nightly   Testicle trouble    one testicle BORN WITH   Tubular adenoma of colon     Past Surgical History:  Procedure Laterality Date   BACK SURGERY     71  LOWER    CARPAL TUNNEL RELEASE Right 10/13/2015   Procedure: RIGHT CARPAL TUNNEL RELEASE;  Surgeon: Daryll Brod, MD;  Location: [place];  Service: Orthopedics;  Laterality: Right;   CARPAL TUNNEL RELEASE Left 07/05/2016   Procedure: LEFT CARPAL TUNNEL RELEASE;  Surgeon: Daryll Brod, MD;  Location: [place];  Service: Orthopedics;  Laterality: Left;   DRUG INDUCED ENDOSCOPY N/A 07/02/2021   Procedure: DRUG INDUCED SLEEP ENDOSCOPY;  Surgeon: Melida Quitter, MD;  Location: [place];  Service: ENT;  Laterality: N/A;   IMPLANTATION OF HYPOGLOSSAL NERVE STIMULATOR Right 10/01/2021   Procedure: IMPLANTATION OF HYPOGLOSSAL NERVE STIMULATOR;  Surgeon: Melida Quitter, MD;  Location: [place];  Service: ENT;  Laterality: Right;   INGUINAL HERNIA REPAIR  2003   right    KNEE ARTHROSCOPY Right 03/03/2016   LUMBAR DISC SURGERY  03/1995   Dr. Coralyn Mark, discectomy   LUMBAR LAMINECTOMY/DECOMPRESSION MICRODISCECTOMY Right 10/06/2016   Procedure: RIGHT LUMBAR THREE - LUMBAR FOUR  LAMINECTOMY, FORAMINOTOMY AND MICRODISCECTOMY;  Surgeon: Jovita Gamma, MD;  Location: [place];  Service: Neurosurgery;  Laterality: Right;   SHOULDER SURGERY  11/28/2005   left partial   SHOULDER SURGERY  07/14/2006   RIGHT   TONSILLECTOMY  AGE 89 OR 4   TOTAL SHOULDER ARTHROPLASTY Right 11/01/2018   Procedure: RIGHT SHOULDER REVISION TO REVERSE TOTAL SHOULDER;  Surgeon: Tania Ade, MD;  Location: [facility];   Service: Orthopedics;  Laterality: Right;  CHOICE ANESTHESIA WITH INTERSCALENE BLOCK EXPAREL, NEEDS RNFA    MEDICATIONS:  albuterol (VENTOLIN HFA) 108 (90 Base) MCG/ACT inhaler   atorvastatin (LIPITOR) 40 MG tablet   b complex vitamins tablet   benzonatate (TESSALON) 100 MG capsule   Blood Glucose Monitoring Suppl (CONTOUR NEXT ONE) KIT   Cholecalciferol (VITAMIN D3) 2000 UNITS capsule   Coenzyme Q10 (COQ10) 200 MG CAPS   Continuous Blood Gluc Receiver (FREESTYLE LIBRE READER) DEVI   Continuous Blood Gluc Sensor (FREESTYLE LIBRE SENSOR SYSTEM) MISC   diltiazem (CARDIZEM) 30 MG tablet   ELIQUIS 2.5 MG TABS tablet   ELIQUIS 5 MG TABS tablet   furosemide (LASIX) 20 MG tablet   Glucagon, rDNA, (GLUCAGON EMERGENCY) 1 MG  KIT   Glucosamine-Chondroit-Vit C-Mn (GLUCOSAMINE 1500 COMPLEX) CAPS   glucose blood (CONTOUR NEXT TEST) test strip   insulin lispro (HUMALOG) 100 UNIT/ML injection   MAGNESIUM OXIDE PO   Microlet Lancets MISC   montelukast (SINGULAIR) 10 MG tablet   multivitamin-lutein (OCUVITE-LUTEIN) CAPS capsule   ondansetron (ZOFRAN) 4 MG tablet   oxyCODONE-acetaminophen (PERCOCET/ROXICET) 5-325 MG tablet   pantoprazole (PROTONIX) 40 MG tablet   Testosterone 30 MG/ACT SOLN   tiZANidine (ZANAFLEX) 2 MG tablet   TOUJEO MAX SOLOSTAR 300 UNIT/ML Solostar Pen   Turmeric Curcumin 500 MG CAPS   valsartan (DIOVAN) 40 MG tablet   No current facility-administered medications for this encounter.    Konrad Felix Ward, PA-C [facility] Pre-Surgical Testing [phone]

## 2022-03-04 DIAGNOSIS — M1711 Unilateral primary osteoarthritis, right knee: Secondary | ICD-10-CM | POA: Diagnosis present

## 2022-03-04 NOTE — H&P (Signed)
TOTAL KNEE ADMISSION H&P  Patient is being admitted for right total knee arthroplasty.  Subjective:  Chief Complaint:right knee pain.  HPI: Tony Cantu, 71 y.o. male, has a history of pain and functional disability in the right knee due to arthritis and has failed non-surgical conservative treatments for greater than 12 weeks to includeNSAID's and/or analgesics, corticosteriod injections, viscosupplementation injections, flexibility and strengthening excercises, use of assistive devices, weight reduction as appropriate, and activity modification.  Onset of symptoms was gradual, starting 4 years ago with gradually worsening course since that time. The patient noted no past surgery on the right knee(s).  Patient currently rates pain in the right knee(s) at 10 out of 10 with activity. Patient has night pain, worsening of pain with activity and weight bearing, pain that interferes with activities of daily living, pain with passive range of motion, crepitus, and joint swelling.  Patient has evidence of periarticular osteophytes and joint space narrowing by imaging studies.  There is no active infection.  Patient Active Problem List   Diagnosis Date Noted   Osteoarthritis of right knee 03/04/2022   Precordial chest pain 04/20/2021   Former smoker 01/12/2021   PAF (paroxysmal atrial fibrillation) ([facility]) 06/22/2020   Hypertension associated with type 2 diabetes mellitus ([place]) 05/17/2019   Hyperlipidemia associated with type 2 diabetes mellitus ([place]) 05/17/2019   GERD with esophagitis 05/17/2019   H/O total shoulder replacement, right 11/01/2018   Paroxysmal tachycardia ([place]) 01/11/2017   Nonrheumatic aortic valve stenosis 01/11/2017   Aortic atherosclerosis ([place]) 11/23/2016   HNP (herniated nucleus pulposus), lumbar 10/06/2016   Acute renal injury ([place]) 08/06/2016   Diarrhea 08/06/2016   Fever 08/06/2016   Hyperbilirubinemia 08/06/2016   Lactic acidosis 08/06/2016   Inguinal hernia 12/10/2015    Right groin pain 11/30/2015   Carpal tunnel syndrome on right 09/09/2015   Cervical spondylosis without myelopathy 09/09/2015   Thrombocytopenia ([place]) 07/21/2014   Bilateral carotid bruits 05/11/2014   Vitamin D deficiency 09/17/2013   BPH (benign prostatic hyperplasia) 05/22/2013   Low serum testosterone level 02/18/2013   Diabetes type 2, controlled ([place]) 01/10/2013   OSA (obstructive sleep apnea) 05/16/2012   Edema 02/21/2012   At risk for coronary artery disease 03/20/2011   Obesity 03/20/2011   Allergic rhinitis 06/22/2010   ASTHMA 06/22/2010   COUGH 06/22/2010   Past Medical History:  Diagnosis Date   Aortic atherosclerosis ([place])    Aortic stenosis    Arthritis    Asthma    BPH (benign prostatic hypertrophy)    Cirrhosis ([place]) 2016   Colon polyps    Diabetes mellitus without complication ([place])    TYPE 1    Diverticulosis    Dysrhythmia 04/19/2021   A-fib noted with possible A-fib RVR   Gastric ulcer    Gastric varices    right   Gastritis    GERD (gastroesophageal reflux disease)    Headache    History- pt states these were migraines that occurred in the 1980's   Heart murmur    History of kidney stones    Hx of adenomatous colonic polyps    Hypertension    PONV (postoperative nausea and vomiting)    pt states he has never had post op nausea or vomiting   Portal hypertensive gastropathy ([place])    Renal cyst, right    Seizures ([facility])    HYPOGLYCEMIC LAST 1 AND 1/2 YRS AGO   Sleep apnea    uses CPAP nightly   Testicle trouble  one testicle BORN WITH   Tubular adenoma of colon     Past Surgical History:  Procedure Laterality Date   BACK SURGERY     94  LOWER    CARPAL TUNNEL RELEASE Right 10/13/2015   Procedure: RIGHT CARPAL TUNNEL RELEASE;  Surgeon: Daryll Brod, MD;  Location: [facility];  Service: Orthopedics;  Laterality: Right;   CARPAL TUNNEL RELEASE Left 07/05/2016   Procedure: LEFT CARPAL TUNNEL RELEASE;  Surgeon: Daryll Brod, MD;   Location: [facility];  Service: Orthopedics;  Laterality: Left;   DRUG INDUCED ENDOSCOPY N/A 07/02/2021   Procedure: DRUG INDUCED SLEEP ENDOSCOPY;  Surgeon: Melida Quitter, MD;  Location: [facility];  Service: ENT;  Laterality: N/A;   IMPLANTATION OF HYPOGLOSSAL NERVE STIMULATOR Right 10/01/2021   Procedure: IMPLANTATION OF HYPOGLOSSAL NERVE STIMULATOR;  Surgeon: Melida Quitter, MD;  Location: [facility];  Service: ENT;  Laterality: Right;   INGUINAL HERNIA REPAIR  2003   right    KNEE ARTHROSCOPY Right 03/03/2016   LUMBAR DISC SURGERY  03/1995   Dr. Coralyn Mark, discectomy   LUMBAR LAMINECTOMY/DECOMPRESSION MICRODISCECTOMY Right 10/06/2016   Procedure: RIGHT LUMBAR THREE - LUMBAR FOUR  LAMINECTOMY, FORAMINOTOMY AND MICRODISCECTOMY;  Surgeon: Jovita Gamma, MD;  Location: [facility];  Service: Neurosurgery;  Laterality: Right;   SHOULDER SURGERY  11/28/2005   left partial   SHOULDER SURGERY  07/14/2006   RIGHT   TONSILLECTOMY  AGE 67 OR 4   TOTAL SHOULDER ARTHROPLASTY Right 11/01/2018   Procedure: RIGHT SHOULDER REVISION TO REVERSE TOTAL SHOULDER;  Surgeon: Tania Ade, MD;  Location: [facility];  Service: Orthopedics;  Laterality: Right;  CHOICE ANESTHESIA WITH INTERSCALENE BLOCK EXPAREL, NEEDS RNFA    No current facility-administered medications for this encounter.   Current Outpatient Medications  Medication Sig Dispense Refill Last Dose   albuterol (VENTOLIN HFA) 108 (90 Base) MCG/ACT inhaler Inhale 2 puffs into the lungs every 6 (six) hours as needed for wheezing or shortness of breath. 6.7 g 0    atorvastatin (LIPITOR) 40 MG tablet TAKE 1 TABLET BY MOUTH DAILY 90 tablet 0    b complex vitamins tablet Take 1 tablet by mouth daily.      Cholecalciferol (VITAMIN D3) 2000 UNITS capsule Take 2,000 Units by mouth daily.      Coenzyme Q10 (COQ10) 200 MG CAPS Take 200 mg by mouth daily.      ELIQUIS 5 MG TABS tablet TAKE 1 TABLET BY MOUTH  TWICE DAILY 180 tablet 3    furosemide (LASIX) 20 MG  tablet TAKE 1 AND 1/2 TABLETS BY MOUTH  DAILY 135 tablet 0    Glucagon, rDNA, (GLUCAGON EMERGENCY) 1 MG KIT INJECT AS DIRECTED INTO  UPPER ARM, THIGH OR  BUTTOCKS AS NEEDED FOR  SEVERE HYPOGLYCEMIA. SEEK  MEDICAL ATTENTION AFTER USE 3 kit 0    Glucosamine-Chondroit-Vit C-Mn (GLUCOSAMINE 1500 COMPLEX) CAPS Take 2 capsules by mouth daily.      insulin lispro (HUMALOG) 100 UNIT/ML injection INJECT SUBCUTANEOUSLY 20 TO 50  UNITS 3 TIMES DAILY BEFORE MEALS PER SLIDING SCALE 140 mL 0    MAGNESIUM OXIDE PO Take 500 mg by mouth daily.      montelukast (SINGULAIR) 10 MG tablet TAKE 1 TABLET BY MOUTH AT  BEDTIME 90 tablet 3    multivitamin-lutein (OCUVITE-LUTEIN) CAPS capsule Take 1 capsule by mouth daily.      pantoprazole (PROTONIX) 40 MG tablet Take 1 tablet (40 mg total) by mouth 2 (two) times daily before a meal.  180 tablet 3    Testosterone 30 MG/ACT SOLN APPLY ONE PUMP TOPICALLY  UNDER EACH AXILLA DAILY  (TOTAL OF 60MG DAILY ) 270 mL 1    TOUJEO MAX SOLOSTAR 300 UNIT/ML Solostar Pen INJECT SUBCUTANEOUSLY 110 UNITS  DAILY (Patient taking differently: Inject 60 Units into the skin daily.) 36 mL 0    Turmeric Curcumin 500 MG CAPS Take 500 mg by mouth daily.      benzonatate (TESSALON) 100 MG capsule Take 1 capsule (100 mg total) by mouth 3 (three) times daily as needed for cough. (Patient not taking: Reported on 02/24/2022) 30 capsule 0 Not Taking   Blood Glucose Monitoring Suppl (CONTOUR NEXT ONE) KIT Test BS QID and as needed Dx E11.9 500 kit 3    Continuous Blood Gluc Receiver (FREESTYLE LIBRE READER) DEVI 1 applicator by Does not apply route as directed. 1 Device 1    Continuous Blood Gluc Sensor (FREESTYLE LIBRE SENSOR SYSTEM) MISC Check BS eight (8) times a day. Dx E10.9 3 each 3    diltiazem (CARDIZEM) 30 MG tablet TAKE 1 TABLET BY MOUTH  TWICE DAILY 180 tablet 3    ELIQUIS 2.5 MG TABS tablet Take 2.5 mg by mouth 2 (two) times daily.      glucose blood (CONTOUR NEXT TEST) test strip Test BS QID and  as needed Dx E11.9 400 strip 3    Microlet Lancets MISC Test BS QID and as needed Dx E11.9 500 each 3    ondansetron (ZOFRAN) 4 MG tablet Take 4 mg by mouth every 6 (six) hours as needed for vomiting or nausea.      oxyCODONE-acetaminophen (PERCOCET/ROXICET) 5-325 MG tablet Take 1 tablet by mouth every 4 (four) hours as needed for pain.      tiZANidine (ZANAFLEX) 2 MG tablet Take 2 mg by mouth every 6 (six) hours as needed for muscle spasms.      valsartan (DIOVAN) 40 MG tablet Take 0.5 tablets (20 mg total) by mouth daily. (Patient not taking: Reported on 02/24/2022) 90 tablet 3 Not Taking   Allergies  Allergen Reactions   Dimetapp Children's Cold-Cough     Does not remember the reaction   Erythromycin Diarrhea   Sulfonamide Derivatives Diarrhea    Social History   Tobacco Use   Smoking status: Former    Years: 34.00    Types: Cigarettes, Pipe    Quit date: 2005    Years since quitting: 18.5   Smokeless tobacco: Never   Tobacco comments:    quit 2005 smoked cigarettes for 5 yrs prior to pipe use  Substance Use Topics   Alcohol use: Not Currently    Family History  Problem Relation Age of Onset   Heart failure Mother    Heart attack Mother        in her 18's   Alzheimer's disease Mother    Diabetes Mother    Asthma Father    Suicidality Father        in his 40's   Allergic rhinitis Sister    Heart failure Maternal Grandmother    Rheum arthritis Maternal Grandmother    Breast cancer Maternal Grandmother    Heart attack Maternal Grandmother        in her 65's   Colon cancer Neg Hx    Esophageal cancer Neg Hx    Pancreatic cancer Neg Hx    Liver disease Neg Hx      Review of Systems  Constitutional: Negative.   HENT: Negative.  Eyes: Negative.   Respiratory: Negative.    Cardiovascular:  Positive for leg swelling.  Gastrointestinal: Negative.   Endocrine:       Blood sugar problem  Genitourinary: Negative.   Musculoskeletal:  Positive for arthralgias.  Skin:  Negative.   Allergic/Immunologic: Negative.   Neurological:  Positive for weakness.  Hematological: Negative.   Psychiatric/Behavioral: Negative.      Objective:  Physical Exam Constitutional:      Appearance: Normal appearance. He is normal weight.  HENT:     Head: Normocephalic and atraumatic.     Nose: Nose normal.     Mouth/Throat:     Mouth: Mucous membranes are moist.     Pharynx: Oropharynx is clear.  Eyes:     Pupils: Pupils are equal, round, and reactive to light.  Cardiovascular:     Pulses: Normal pulses.  Pulmonary:     Effort: Pulmonary effort is normal.  Musculoskeletal:        General: Swelling and tenderness present.     Cervical back: Normal range of motion and neck supple.     Comments: Patient's right knee has a range from 0-120.  No instability.  Calves are soft and nontender.  Mild crepitance with range of motion.  Neurological:     General: No focal deficit present.     Mental Status: He is alert and oriented to person, place, and time. Mental status is at baseline.  Psychiatric:        Mood and Affect: Mood normal.        Behavior: Behavior normal.        Thought Content: Thought content normal.        Judgment: Judgment normal.     Vital signs in last 24 hours:    Labs:   Estimated body mass index is 34.06 kg/m as calculated from the following:   Height as of 03/02/22: 5' 8"  (1.727 m).   Weight as of 03/02/22: 101.6 kg.   Imaging Review Plain radiographs demonstrate  bilateral AP weightbearing, bilateral Rosenberg, lateral sunrise views of the right and left knee are taken and reviewed in office today.  This shows end-stage bone-on-bone arthritis medial compartment right knee.  Patient has near bone-on-bone arthritis left knee.      Assessment/Plan:  End stage arthritis, right knee   The patient history, physical examination, clinical judgment of the provider and imaging studies are consistent with end stage degenerative joint disease  of the right knee(s) and total knee arthroplasty is deemed medically necessary. The treatment options including medical management, injection therapy arthroscopy and arthroplasty were discussed at length. The risks and benefits of total knee arthroplasty were presented and reviewed. The risks due to aseptic loosening, infection, stiffness, patella tracking problems, thromboembolic complications and other imponderables were discussed. The patient acknowledged the explanation, agreed to proceed with the plan and consent was signed. Patient is being admitted for inpatient treatment for surgery, pain control, PT, OT, prophylactic antibiotics, VTE prophylaxis, progressive ambulation and ADL's and discharge planning. The patient is planning to be discharged home with home health services     Patient's anticipated LOS is less than 2 midnights, meeting these requirements: - Younger than 74 - Lives within 1 hour of care - Has a competent adult at home to recover with post-op recover - NO history of  - Chronic pain requiring opiods  - Diabetes  - Coronary Artery Disease  - Heart failure  - Heart attack  - Stroke  - DVT/VTE  -  Cardiac arrhythmia  - Respiratory Failure/COPD  - Renal failure  - Anemia  - Advanced Liver disease

## 2022-03-05 DIAGNOSIS — E1122 Type 2 diabetes mellitus with diabetic chronic kidney disease: Secondary | ICD-10-CM | POA: Diagnosis not present

## 2022-03-05 DIAGNOSIS — Z794 Long term (current) use of insulin: Secondary | ICD-10-CM | POA: Diagnosis not present

## 2022-03-05 DIAGNOSIS — N182 Chronic kidney disease, stage 2 (mild): Secondary | ICD-10-CM | POA: Diagnosis not present

## 2022-03-07 ENCOUNTER — Encounter (HOSPITAL_COMMUNITY): Payer: Self-pay | Admitting: Orthopedic Surgery

## 2022-03-07 ENCOUNTER — Ambulatory Visit (HOSPITAL_COMMUNITY): Payer: BC Managed Care – PPO | Admitting: Physician Assistant

## 2022-03-07 ENCOUNTER — Encounter (HOSPITAL_COMMUNITY)
Admission: RE | Disposition: A | Discharge status: Home / Self Care | Payer: Self-pay | Source: Ambulatory Visit | Attending: Orthopedic Surgery

## 2022-03-07 ENCOUNTER — Encounter: Payer: BC Managed Care – PPO | Admitting: Physical Therapy

## 2022-03-07 ENCOUNTER — Ambulatory Visit (HOSPITAL_COMMUNITY): Payer: BC Managed Care – PPO | Admitting: Certified Registered Nurse Anesthetist

## 2022-03-07 ENCOUNTER — Other Ambulatory Visit: Payer: Self-pay

## 2022-03-07 ENCOUNTER — Observation Stay (HOSPITAL_COMMUNITY)
Admission: RE | Admit: 2022-03-07 | Discharge: 2022-03-08 | Disposition: A | Discharge status: Home / Self Care | Payer: BC Managed Care – PPO | Source: Ambulatory Visit | Attending: Orthopedic Surgery | Admitting: Orthopedic Surgery

## 2022-03-07 DIAGNOSIS — Z7901 Long term (current) use of anticoagulants: Secondary | ICD-10-CM | POA: Diagnosis not present

## 2022-03-07 DIAGNOSIS — Z79899 Other long term (current) drug therapy: Secondary | ICD-10-CM | POA: Insufficient documentation

## 2022-03-07 DIAGNOSIS — Z794 Long term (current) use of insulin: Secondary | ICD-10-CM | POA: Diagnosis not present

## 2022-03-07 DIAGNOSIS — Z96651 Presence of right artificial knee joint: Secondary | ICD-10-CM | POA: Diagnosis not present

## 2022-03-07 DIAGNOSIS — G8918 Other acute postprocedural pain: Secondary | ICD-10-CM | POA: Diagnosis not present

## 2022-03-07 DIAGNOSIS — Z87891 Personal history of nicotine dependence: Secondary | ICD-10-CM | POA: Insufficient documentation

## 2022-03-07 DIAGNOSIS — M1711 Unilateral primary osteoarthritis, right knee: Principal | ICD-10-CM | POA: Insufficient documentation

## 2022-03-07 DIAGNOSIS — J45909 Unspecified asthma, uncomplicated: Secondary | ICD-10-CM | POA: Insufficient documentation

## 2022-03-07 DIAGNOSIS — E119 Type 2 diabetes mellitus without complications: Secondary | ICD-10-CM | POA: Insufficient documentation

## 2022-03-07 DIAGNOSIS — I10 Essential (primary) hypertension: Secondary | ICD-10-CM | POA: Insufficient documentation

## 2022-03-07 DIAGNOSIS — I480 Paroxysmal atrial fibrillation: Secondary | ICD-10-CM | POA: Diagnosis not present

## 2022-03-07 DIAGNOSIS — K279 Peptic ulcer, site unspecified, unspecified as acute or chronic, without hemorrhage or perforation: Secondary | ICD-10-CM | POA: Diagnosis not present

## 2022-03-07 DIAGNOSIS — N182 Chronic kidney disease, stage 2 (mild): Secondary | ICD-10-CM | POA: Diagnosis not present

## 2022-03-07 DIAGNOSIS — E1122 Type 2 diabetes mellitus with diabetic chronic kidney disease: Secondary | ICD-10-CM | POA: Diagnosis not present

## 2022-03-07 DIAGNOSIS — Z96611 Presence of right artificial shoulder joint: Secondary | ICD-10-CM | POA: Insufficient documentation

## 2022-03-07 HISTORY — PX: TOTAL KNEE ARTHROPLASTY: SHX125

## 2022-03-07 LAB — TYPE AND SCREEN
ABO/RH(D): B POS
Antibody Screen: NEGATIVE

## 2022-03-07 LAB — GLUCOSE, CAPILLARY
Glucose-Capillary: 119 mg/dL — ABNORMAL HIGH (ref 70–99)
Glucose-Capillary: 168 mg/dL — ABNORMAL HIGH (ref 70–99)
Glucose-Capillary: 351 mg/dL — ABNORMAL HIGH (ref 70–99)

## 2022-03-07 SURGERY — ARTHROPLASTY, KNEE, TOTAL
Anesthesia: General | Site: Knee | Laterality: Right

## 2022-03-07 MED ORDER — DILTIAZEM HCL 30 MG PO TABS
30.0000 mg | ORAL_TABLET | Freq: Two times a day (BID) | ORAL | Status: DC
  Administered 2022-03-07 – 2022-03-08 (×2): 30 mg via ORAL
  Filled 2022-03-07 (×2): qty 1

## 2022-03-07 MED ORDER — ACETAMINOPHEN 10 MG/ML IV SOLN
INTRAVENOUS | Status: AC
  Filled 2022-03-07: qty 100

## 2022-03-07 MED ORDER — MIDAZOLAM HCL 2 MG/2ML IJ SOLN
1.0000 mg | Freq: Once | INTRAMUSCULAR | Status: AC
  Administered 2022-03-07: 1.5 mg via INTRAVENOUS
  Filled 2022-03-07: qty 2

## 2022-03-07 MED ORDER — MENTHOL 3 MG MT LOZG: 1.0000 | LOZENGE | OROMUCOSAL | Status: DC | PRN

## 2022-03-07 MED ORDER — FUROSEMIDE 20 MG PO TABS
30.0000 mg | ORAL_TABLET | Freq: Every day | ORAL | Status: DC
  Administered 2022-03-08: 30 mg via ORAL
  Filled 2022-03-07: qty 2

## 2022-03-07 MED ORDER — ACETAMINOPHEN 325 MG PO TABS
ORAL_TABLET | ORAL | Status: AC
  Filled 2022-03-07: qty 2

## 2022-03-07 MED ORDER — ORAL CARE MOUTH RINSE: 15.0000 mL | Freq: Once | OROMUCOSAL | Status: AC

## 2022-03-07 MED ORDER — MONTELUKAST SODIUM 10 MG PO TABS
10.0000 mg | ORAL_TABLET | Freq: Every day | ORAL | Status: DC
  Administered 2022-03-07: 10 mg via ORAL
  Filled 2022-03-07: qty 1

## 2022-03-07 MED ORDER — INSULIN GLARGINE-YFGN 100 UNIT/ML ~~LOC~~ SOLN
60.0000 [IU] | Freq: Every day | SUBCUTANEOUS | Status: DC
  Filled 2022-03-07: qty 0.6

## 2022-03-07 MED ORDER — LACTATED RINGERS IV SOLN: INTRAVENOUS | Status: DC

## 2022-03-07 MED ORDER — ALBUTEROL SULFATE HFA 108 (90 BASE) MCG/ACT IN AERS: 2.0000 | INHALATION_SPRAY | Freq: Four times a day (QID) | RESPIRATORY_TRACT | Status: DC | PRN

## 2022-03-07 MED ORDER — SODIUM CHLORIDE (PF) 0.9 % IJ SOLN
INTRAMUSCULAR | Status: DC | PRN
  Administered 2022-03-07: 50 mL via INTRAVENOUS

## 2022-03-07 MED ORDER — BUPIVACAINE LIPOSOME 1.3 % IJ SUSP: 20.0000 mL | Freq: Once | INTRAMUSCULAR | Status: DC

## 2022-03-07 MED ORDER — FENTANYL CITRATE PF 50 MCG/ML IJ SOSY
25.0000 ug | PREFILLED_SYRINGE | INTRAMUSCULAR | Status: DC | PRN
  Administered 2022-03-07: 50 ug via INTRAVENOUS

## 2022-03-07 MED ORDER — TESTOSTERONE 30 MG/ACT TD SOLN: 30.0000 mg | Freq: Every day | TRANSDERMAL | Status: DC

## 2022-03-07 MED ORDER — FENTANYL CITRATE PF 50 MCG/ML IJ SOSY
PREFILLED_SYRINGE | INTRAMUSCULAR | Status: AC
  Filled 2022-03-07: qty 1

## 2022-03-07 MED ORDER — ALUM & MAG HYDROXIDE-SIMETH 200-200-20 MG/5ML PO SUSP: 30.0000 mL | ORAL | Status: DC | PRN

## 2022-03-07 MED ORDER — CEFAZOLIN SODIUM-DEXTROSE 2-4 GM/100ML-% IV SOLN
2.0000 g | INTRAVENOUS | Status: AC
  Administered 2022-03-07: 2 g via INTRAVENOUS
  Filled 2022-03-07: qty 100

## 2022-03-07 MED ORDER — PHENYLEPHRINE 80 MCG/ML (10ML) SYRINGE FOR IV PUSH (FOR BLOOD PRESSURE SUPPORT)
PREFILLED_SYRINGE | INTRAVENOUS | Status: DC | PRN
  Administered 2022-03-07: 80 ug via INTRAVENOUS
  Administered 2022-03-07: 120 ug via INTRAVENOUS
  Administered 2022-03-07: 80 ug via INTRAVENOUS

## 2022-03-07 MED ORDER — PROPOFOL 10 MG/ML IV BOLUS
INTRAVENOUS | Status: DC | PRN
  Administered 2022-03-07 (×2): 50 mg via INTRAVENOUS
  Administered 2022-03-07: 80 ug/kg/min via INTRAVENOUS
  Administered 2022-03-07: 100 mg via INTRAVENOUS

## 2022-03-07 MED ORDER — ONDANSETRON HCL 4 MG/2ML IJ SOLN
INTRAMUSCULAR | Status: AC
  Filled 2022-03-07: qty 2

## 2022-03-07 MED ORDER — TRANEXAMIC ACID-NACL 1000-0.7 MG/100ML-% IV SOLN
1000.0000 mg | Freq: Once | INTRAVENOUS | Status: AC
  Administered 2022-03-07: 1000 mg via INTRAVENOUS
  Filled 2022-03-07: qty 100

## 2022-03-07 MED ORDER — INSULIN ASPART 100 UNIT/ML IJ SOLN: 0.0000 [IU] | Freq: Three times a day (TID) | INTRAMUSCULAR | Status: DC

## 2022-03-07 MED ORDER — PANTOPRAZOLE SODIUM 40 MG PO TBEC
40.0000 mg | DELAYED_RELEASE_TABLET | Freq: Two times a day (BID) | ORAL | Status: DC
  Administered 2022-03-08: 40 mg via ORAL
  Filled 2022-03-07: qty 1

## 2022-03-07 MED ORDER — DEXAMETHASONE SODIUM PHOSPHATE 10 MG/ML IJ SOLN
INTRAMUSCULAR | Status: DC | PRN
  Administered 2022-03-07: 4 mg via INTRAVENOUS

## 2022-03-07 MED ORDER — PROPOFOL 10 MG/ML IV BOLUS
INTRAVENOUS | Status: AC
  Filled 2022-03-07: qty 20

## 2022-03-07 MED ORDER — WATER FOR IRRIGATION, STERILE IR SOLN
Status: DC | PRN
  Administered 2022-03-07: 2000 mL

## 2022-03-07 MED ORDER — FENTANYL CITRATE (PF) 100 MCG/2ML IJ SOLN
INTRAMUSCULAR | Status: DC | PRN
  Administered 2022-03-07 (×3): 50 ug via INTRAVENOUS

## 2022-03-07 MED ORDER — POVIDONE-IODINE 10 % EX SWAB
2.0000 | Freq: Once | CUTANEOUS | Status: AC
  Administered 2022-03-07: 2 via TOPICAL

## 2022-03-07 MED ORDER — FENTANYL CITRATE (PF) 250 MCG/5ML IJ SOLN
INTRAMUSCULAR | Status: AC
  Filled 2022-03-07: qty 5

## 2022-03-07 MED ORDER — DEXAMETHASONE SODIUM PHOSPHATE 10 MG/ML IJ SOLN
INTRAMUSCULAR | Status: AC
  Filled 2022-03-07: qty 1

## 2022-03-07 MED ORDER — SODIUM CHLORIDE (PF) 0.9 % IJ SOLN
INTRAMUSCULAR | Status: AC
  Filled 2022-03-07: qty 50

## 2022-03-07 MED ORDER — BUPIVACAINE LIPOSOME 1.3 % IJ SUSP
INTRAMUSCULAR | Status: AC
Start: 2022-03-07 — End: ?
  Filled 2022-03-07: qty 20

## 2022-03-07 MED ORDER — TRANEXAMIC ACID 1000 MG/10ML IV SOLN
INTRAVENOUS | Status: DC | PRN
  Administered 2022-03-07: 2000 mg via TOPICAL

## 2022-03-07 MED ORDER — DOCUSATE SODIUM 100 MG PO CAPS
100.0000 mg | ORAL_CAPSULE | Freq: Two times a day (BID) | ORAL | Status: DC
  Administered 2022-03-07 – 2022-03-08 (×2): 100 mg via ORAL
  Filled 2022-03-07 (×2): qty 1

## 2022-03-07 MED ORDER — BUPIVACAINE LIPOSOME 1.3 % IJ SUSP
INTRAMUSCULAR | Status: DC | PRN
  Administered 2022-03-07: 20 mL

## 2022-03-07 MED ORDER — FLEET ENEMA 7-19 GM/118ML RE ENEM: 1.0000 | ENEMA | Freq: Once | RECTAL | Status: DC | PRN

## 2022-03-07 MED ORDER — PHENOL 1.4 % MT LIQD: 1.0000 | OROMUCOSAL | Status: DC | PRN

## 2022-03-07 MED ORDER — HYDROMORPHONE HCL 2 MG PO TABS: 2.0000 mg | ORAL_TABLET | ORAL | Status: DC | PRN

## 2022-03-07 MED ORDER — APIXABAN 2.5 MG PO TABS
2.5000 mg | ORAL_TABLET | Freq: Two times a day (BID) | ORAL | Status: DC
  Administered 2022-03-08: 2.5 mg via ORAL
  Filled 2022-03-07: qty 1

## 2022-03-07 MED ORDER — ACETAMINOPHEN 500 MG PO TABS
1000.0000 mg | ORAL_TABLET | Freq: Four times a day (QID) | ORAL | Status: DC
  Administered 2022-03-07 – 2022-03-08 (×3): 1000 mg via ORAL
  Filled 2022-03-07 (×3): qty 2

## 2022-03-07 MED ORDER — BUPIVACAINE-EPINEPHRINE (PF) 0.25% -1:200000 IJ SOLN
INTRAMUSCULAR | Status: AC
  Filled 2022-03-07: qty 30

## 2022-03-07 MED ORDER — OXYCODONE HCL 5 MG PO TABS
5.0000 mg | ORAL_TABLET | ORAL | Status: DC | PRN
  Administered 2022-03-07 – 2022-03-08 (×2): 10 mg via ORAL
  Filled 2022-03-07 (×2): qty 2

## 2022-03-07 MED ORDER — BISACODYL 5 MG PO TBEC: 5.0000 mg | DELAYED_RELEASE_TABLET | Freq: Every day | ORAL | Status: DC | PRN

## 2022-03-07 MED ORDER — ONDANSETRON HCL 4 MG/2ML IJ SOLN: 4.0000 mg | Freq: Four times a day (QID) | INTRAMUSCULAR | Status: DC | PRN

## 2022-03-07 MED ORDER — ACETAMINOPHEN 325 MG PO TABS
325.0000 mg | ORAL_TABLET | Freq: Four times a day (QID) | ORAL | Status: DC | PRN
  Administered 2022-03-07: 650 mg via ORAL

## 2022-03-07 MED ORDER — ONDANSETRON HCL 4 MG PO TABS: 4.0000 mg | ORAL_TABLET | Freq: Four times a day (QID) | ORAL | Status: DC | PRN

## 2022-03-07 MED ORDER — KCL IN DEXTROSE-NACL 20-5-0.45 MEQ/L-%-% IV SOLN
INTRAVENOUS | Status: DC
  Filled 2022-03-07 (×2): qty 1000

## 2022-03-07 MED ORDER — METOCLOPRAMIDE HCL 5 MG PO TABS: 5.0000 mg | ORAL_TABLET | Freq: Three times a day (TID) | ORAL | Status: DC | PRN

## 2022-03-07 MED ORDER — FENTANYL CITRATE PF 50 MCG/ML IJ SOSY
50.0000 ug | PREFILLED_SYRINGE | Freq: Once | INTRAMUSCULAR | Status: AC
  Administered 2022-03-07: 50 ug via INTRAVENOUS
  Filled 2022-03-07: qty 2

## 2022-03-07 MED ORDER — LIDOCAINE 2% (20 MG/ML) 5 ML SYRINGE
INTRAMUSCULAR | Status: DC | PRN
  Administered 2022-03-07: 50 mg via INTRAVENOUS

## 2022-03-07 MED ORDER — PHENYLEPHRINE HCL (PRESSORS) 10 MG/ML IV SOLN
INTRAVENOUS | Status: AC
  Filled 2022-03-07: qty 1

## 2022-03-07 MED ORDER — PHENYLEPHRINE HCL-NACL 20-0.9 MG/250ML-% IV SOLN
INTRAVENOUS | Status: DC | PRN
  Administered 2022-03-07: 60 ug/min via INTRAVENOUS

## 2022-03-07 MED ORDER — TRANEXAMIC ACID 1000 MG/10ML IV SOLN
2000.0000 mg | INTRAVENOUS | Status: DC
  Filled 2022-03-07: qty 20

## 2022-03-07 MED ORDER — FENTANYL CITRATE (PF) 100 MCG/2ML IJ SOLN
INTRAMUSCULAR | Status: AC
  Filled 2022-03-07: qty 2

## 2022-03-07 MED ORDER — METOCLOPRAMIDE HCL 5 MG/ML IJ SOLN: 5.0000 mg | Freq: Three times a day (TID) | INTRAMUSCULAR | Status: DC | PRN

## 2022-03-07 MED ORDER — SODIUM CHLORIDE 0.9 % IR SOLN
Status: DC | PRN
  Administered 2022-03-07: 1000 mL

## 2022-03-07 MED ORDER — PANTOPRAZOLE SODIUM 40 MG PO TBEC: 40.0000 mg | DELAYED_RELEASE_TABLET | Freq: Every day | ORAL | Status: DC

## 2022-03-07 MED ORDER — IRBESARTAN 75 MG PO TABS: 37.5000 mg | ORAL_TABLET | Freq: Every day | ORAL | Status: DC

## 2022-03-07 MED ORDER — CHLORHEXIDINE GLUCONATE 0.12 % MT SOLN
15.0000 mL | Freq: Once | OROMUCOSAL | Status: AC
Start: 2022-03-07 — End: 2022-03-07
  Administered 2022-03-07: 15 mL via OROMUCOSAL

## 2022-03-07 MED ORDER — POLYETHYLENE GLYCOL 3350 17 G PO PACK: 17.0000 g | PACK | Freq: Every day | ORAL | Status: DC | PRN

## 2022-03-07 MED ORDER — LIDOCAINE HCL (PF) 2 % IJ SOLN
INTRAMUSCULAR | Status: AC
  Filled 2022-03-07: qty 5

## 2022-03-07 MED ORDER — BUPIVACAINE-EPINEPHRINE (PF) 0.25% -1:200000 IJ SOLN
INTRAMUSCULAR | Status: DC | PRN
  Administered 2022-03-07: 30 mL via PERINEURAL

## 2022-03-07 MED ORDER — ONDANSETRON HCL 4 MG/2ML IJ SOLN
INTRAMUSCULAR | Status: DC | PRN
  Administered 2022-03-07: 4 mg via INTRAVENOUS

## 2022-03-07 MED ORDER — HYDROMORPHONE HCL 1 MG/ML IJ SOLN: 0.5000 mg | INTRAMUSCULAR | Status: DC | PRN

## 2022-03-07 MED ORDER — TRANEXAMIC ACID-NACL 1000-0.7 MG/100ML-% IV SOLN
1000.0000 mg | INTRAVENOUS | Status: AC
  Administered 2022-03-07: 1000 mg via INTRAVENOUS
  Filled 2022-03-07: qty 100

## 2022-03-07 SURGICAL SUPPLY — 55 items
ATTUNE MED DOME PAT 38 KNEE (Knees) ×1 IMPLANT
ATTUNE PS FEM RT SZ 6 CEM KNEE (Femur) ×1 IMPLANT
ATTUNE PSRP INSR SZ6 5 KNEE (Insert) ×1 IMPLANT
BAG COUNTER SPONGE SURGICOUNT (BAG) ×1 IMPLANT
BAG DECANTER FOR FLEXI CONT (MISCELLANEOUS) ×2 IMPLANT
BAG SPEC THK2 15X12 ZIP CLS (MISCELLANEOUS) ×1
BAG SPNG CNTER NS LX DISP (BAG) ×1
BAG ZIPLOCK 12X15 (MISCELLANEOUS) ×2 IMPLANT
BASE TIBIAL ROT PLAT SZ 7 KNEE (Knees) IMPLANT
BLADE SAG 18X100X1.27 (BLADE) ×2 IMPLANT
BLADE SAW SGTL 11.0X1.19X90.0M (BLADE) ×2 IMPLANT
BLADE SURG SZ10 CARB STEEL (BLADE) ×4 IMPLANT
BNDG CMPR MED 10X6 ELC LF (GAUZE/BANDAGES/DRESSINGS) ×1
BNDG ELASTIC 6X10 VLCR STRL LF (GAUZE/BANDAGES/DRESSINGS) ×2 IMPLANT
BOWL SMART MIX CTS (DISPOSABLE) ×2 IMPLANT
BSPLAT TIB 7 CMNT ROT PLAT STR (Knees) ×1 IMPLANT
CEMENT HV SMART SET (Cement) ×4 IMPLANT
COVER SURGICAL LIGHT HANDLE (MISCELLANEOUS) ×2 IMPLANT
CUFF TOURN SGL QUICK 34 (TOURNIQUET CUFF) ×2
CUFF TRNQT CYL 34X4.125X (TOURNIQUET CUFF) ×1 IMPLANT
DRAPE INCISE IOBAN 66X45 STRL (DRAPES) ×2 IMPLANT
DRAPE U-SHAPE 47X51 STRL (DRAPES) ×2 IMPLANT
DRESSING AQUACEL AG SP 3.5X10 (GAUZE/BANDAGES/DRESSINGS) IMPLANT
DRSG AQUACEL AG ADV 3.5X10 (GAUZE/BANDAGES/DRESSINGS) ×2 IMPLANT
DRSG AQUACEL AG SP 3.5X10 (GAUZE/BANDAGES/DRESSINGS) ×2
DURAPREP 26ML APPLICATOR (WOUND CARE) ×2 IMPLANT
ELECT REM PT RETURN 15FT ADLT (MISCELLANEOUS) ×2 IMPLANT
GLOVE BIO SURGEON STRL SZ7.5 (GLOVE) ×2 IMPLANT
GLOVE BIO SURGEON STRL SZ8.5 (GLOVE) ×2 IMPLANT
GLOVE BIOGEL PI IND STRL 8 (GLOVE) ×1 IMPLANT
GLOVE BIOGEL PI IND STRL 9 (GLOVE) ×1 IMPLANT
GLOVE BIOGEL PI INDICATOR 8 (GLOVE) ×1
GLOVE BIOGEL PI INDICATOR 9 (GLOVE) ×1
GOWN STRL REUS W/ TWL XL LVL3 (GOWN DISPOSABLE) ×2 IMPLANT
GOWN STRL REUS W/TWL XL LVL3 (GOWN DISPOSABLE) ×4
HANDPIECE INTERPULSE COAX TIP (DISPOSABLE) ×2
HOOD PEEL AWAY FLYTE STAYCOOL (MISCELLANEOUS) ×6 IMPLANT
KIT TURNOVER KIT A (KITS) IMPLANT
NDL HYPO 21X1.5 SAFETY (NEEDLE) ×2 IMPLANT
NEEDLE HYPO 21X1.5 SAFETY (NEEDLE) ×4 IMPLANT
NS IRRIG 1000ML POUR BTL (IV SOLUTION) ×2 IMPLANT
PACK TOTAL KNEE CUSTOM (KITS) ×2 IMPLANT
PROTECTOR NERVE ULNAR (MISCELLANEOUS) ×2 IMPLANT
SET HNDPC FAN SPRY TIP SCT (DISPOSABLE) ×1 IMPLANT
SPIKE FLUID TRANSFER (MISCELLANEOUS) ×6 IMPLANT
SUT VIC AB 1 CTX 36 (SUTURE) ×2
SUT VIC AB 1 CTX36XBRD ANBCTR (SUTURE) ×1 IMPLANT
SUT VIC AB 3-0 CT1 27 (SUTURE) ×6
SUT VIC AB 3-0 CT1 TAPERPNT 27 (SUTURE) ×3 IMPLANT
SYR CONTROL 10ML LL (SYRINGE) ×4 IMPLANT
TIBIAL BASE ROT PLAT SZ 7 KNEE (Knees) ×2 IMPLANT
TRAY CATH INTERMITTENT SS 16FR (CATHETERS) IMPLANT
TRAY FOLEY MTR SLVR 16FR STAT (SET/KITS/TRAYS/PACK) ×2 IMPLANT
WATER STERILE IRR 1000ML POUR (IV SOLUTION) ×4 IMPLANT
WRAP KNEE MAXI GEL POST OP (GAUZE/BANDAGES/DRESSINGS) ×2 IMPLANT

## 2022-03-07 NOTE — Addendum Note (Signed)
Addendum  created 03/07/22 1819 by Belinda Block, MD   Child order released for a procedure order, Clinical Note Signed, Image imported, Intraprocedure Blocks edited, SmartForm saved

## 2022-03-07 NOTE — Transfer of Care (Signed)
Immediate Anesthesia Transfer of Care Note  Patient: Tony Cantu  Procedure(s) Performed: RIGHT TOTAL KNEE ARTHROPLASTY (Right: Knee)  Patient Location: PACU  Anesthesia Type:General  Level of Consciousness: drowsy  Airway & Oxygen Therapy: Patient Spontanous Breathing and Patient connected to face mask oxygen  Post-op Assessment: Report given to RN and Post -op Vital signs reviewed and stable  Post vital signs: Reviewed and stable  Last Vitals:  Vitals Value Taken Time  BP    Temp    Pulse    Resp    SpO2      Last Pain:  Vitals:   03/07/22 1227  TempSrc:   PainSc: 0-No pain      Patients Stated Pain Goal: 4 (48/84/57 3344)  Complications: No notable events documented.

## 2022-03-07 NOTE — Progress Notes (Signed)
AssistedDr. Belinda Block with right, adductor canal, ultrasound guided block. Side rails up, monitors on throughout procedure. See vital signs in flow sheet. Tolerated Procedure well.

## 2022-03-07 NOTE — Anesthesia Postprocedure Evaluation (Signed)
Anesthesia Post Note  Patient: Tony Cantu  Procedure(s) Performed: RIGHT TOTAL KNEE ARTHROPLASTY (Right: Knee)     Patient location during evaluation: PACU Anesthesia Type: General Level of consciousness: awake Pain management: pain level controlled Vital Signs Assessment: post-procedure vital signs reviewed and stable Respiratory status: spontaneous breathing Cardiovascular status: stable Postop Assessment: no apparent nausea or vomiting Anesthetic complications: no   No notable events documented.  Last Vitals:  Vitals:   03/07/22 1223 03/07/22 1227  BP: 132/87 124/77  Pulse: 64 60  Resp: 16 12  Temp:    SpO2: 99% 99%    Last Pain:  Vitals:   03/07/22 1227  TempSrc:   PainSc: 0-No pain                 [name]

## 2022-03-07 NOTE — Anesthesia Procedure Notes (Signed)
Procedure Name: LMA Insertion Date/Time: 03/07/2022 1:20 PM  Performed by: Milford Cage, CRNAPre-anesthesia Checklist: Patient identified, Emergency Drugs available, Suction available and Patient being monitored Patient Re-evaluated:Patient Re-evaluated prior to induction Oxygen Delivery Method: Circle system utilized Preoxygenation: Pre-oxygenation with 100% oxygen Induction Type: IV induction Ventilation: Mask ventilation without difficulty LMA: LMA inserted and LMA with gastric port inserted LMA Size: 4.0 Number of attempts: 1 Tube secured with: Tape Dental Injury: Teeth and Oropharynx as per pre-operative assessment

## 2022-03-07 NOTE — Interval H&P Note (Signed)
History and Physical Interval Note:  03/07/2022 11:10 AM  Tony Cantu  has presented today for surgery, with the diagnosis of RIGHT KNEE OSTEOARTHRITIS.  The various methods of treatment have been discussed with the patient and family. After consideration of risks, benefits and other options for treatment, the patient has consented to  Procedure(s): RIGHT TOTAL KNEE ARTHROPLASTY (Right) as a surgical intervention.  The patient's history has been reviewed, patient examined, no change in status, stable for surgery.  I have reviewed the patient's chart and labs.  Questions were answered to the patient's satisfaction.     Kerin Salen

## 2022-03-07 NOTE — Anesthesia Procedure Notes (Signed)
Arterial Line Insertion Start/End7/05/2022 12:50 PM, 03/07/2022 1:05 PM Performed by: Belinda Block, MD, Milford Cage, CRNA, anesthesiologist  Patient location: Pre-op. Preanesthetic checklist: patient identified, IV checked, site marked, risks and benefits discussed, surgical consent, monitors and equipment checked, pre-op evaluation, timeout performed and anesthesia consent Lidocaine 1% used for infiltration and patient sedated Left, radial was placed Catheter size: 20 G Hand hygiene performed , maximum sterile barriers used  and Seldinger technique used Allen's test indicative of satisfactory collateral circulation Attempts: 3 Procedure performed without using ultrasound guided technique. Following insertion, Biopatch and dressing applied. Post procedure assessment: normal  Post procedure complications: unsuccessful attempts and second provider assisted. Patient tolerated the procedure well with no immediate complications.

## 2022-03-07 NOTE — Op Note (Signed)
PATIENT ID:      Tony Cantu  MRN:     [id number] DOB/AGE:    [birth year] / 71 y.o.       OPERATIVE REPORT   DATE OF PROCEDURE:  03/07/2022      PREOPERATIVE DIAGNOSIS:   RIGHT KNEE OSTEOARTHRITIS      Estimated body mass index is 34.06 kg/m as calculated from the following:   Height as of 03/02/22: 5' 8"  (1.727 m).   Weight as of 03/02/22: 101.6 kg.                                                       POSTOPERATIVE DIAGNOSIS:   Same                                                                  PROCEDURE:  Procedure(s): RIGHT TOTAL KNEE ARTHROPLASTY Using DepuyAttune RP implants #6R Femur, #7Tibia, 5 mm Attune RP bearing, 38 Patella    SURGEON: Kerin Salen  ASSISTANT:   Kerry Hough. [employer]   (Present and scrubbed throughout the case, critical for assistance with exposure, retraction, instrumentation, and closure.)        ANESTHESIA: Spinal, 20cc Exparel, 50cc 0.25% Marcaine EBL: 300 cc FLUID REPLACEMENT: 1500 cc crystaloid TOURNIQUET: DRAINS: None TRANEXAMIC ACID: 1gm IV, 2gm topical COMPLICATIONS:  None         INDICATIONS FOR PROCEDURE: The patient has  RIGHT KNEE OSTEOARTHRITIS, Var deformities, XR shows bone on bone arthritis, lateral subluxation of tibia. Patient has failed all conservative measures including anti-inflammatory medicines, narcotics, attempts at exercise and weight loss, cortisone injections and viscosupplementation.  Risks and benefits of surgery have been discussed, questions answered.   DESCRIPTION OF PROCEDURE: The patient identified by armband, received  IV antibiotics, in the holding area at [facility]. Patient taken to the operating room, appropriate anesthetic monitors were attached, and Spinal anesthesia was  induced. IV Tranexamic acid was given.Tourniquet applied high to the operative thigh. Lateral post and foot positioner applied to the table, the lower extremity was then prepped and draped in usual sterile fashion from the toes to the  tourniquet. Time-out procedure was performed. Kerry Hough. [facility] PAC, was present and scrubbed throughout the case, critical for assistance with, positioning, exposure, retraction, instrumentation, and closure.The skin and subcutaneous tissue along the incision was injected with 20 cc of a mixture of Exparel and Marcaine solution, using a 20-gauge by 1-1/2 inch needle. We began the operation, with the knee flexed 130 degrees, by making the anterior midline incision starting at handbreadth above the patella going over the patella 1 cm medial to and 4 cm distal to the tibial tubercle. Small bleeders in the skin and the subcutaneous tissue identified and cauterized. Transverse retinaculum was incised and reflected medially and a medial parapatellar arthrotomy was accomplished. the patella was everted and theprepatellar fat pad resected. The superficial medial collateral ligament was then elevated from anterior to posterior along the proximal flare of the tibia and anterior half of the menisci resected. The knee was hyperflexed exposing bone on bone arthritis. Peripheral and notch  osteophytes as well as the cruciate ligaments were then resected. We continued to work our way around posteriorly along the proximal tibia, and externally rotated the tibia subluxing it out from underneath the femur. A McHale PCL retractor was placed through the notch and a lateral Hohmann retractor placed, and we then entered the proximal tibia in line with the Depuy starter drill in line with the axis of the tibia followed by an intramedullary guide rod and 0-degree posterior slope cutting guide. The tibial cutting guide, 4 degree posterior sloped, was pinned into place allowing resection of 3 mm of bone medially and 9 mm of bone laterally. Satisfied with the tibial resection, we then entered the distal femur 2 mm anterior to the PCL origin with the intramedullary guide rod and applied the distal femoral cutting guide set at 9 mm, with 5  degrees of valgus. This was pinned along the epicondylar axis. At this point, the distal femoral cut was accomplished without difficulty. We then sized for a #6R femoral component and pinned the guide in 3 degrees of external rotation. The chamfer cutting guide was pinned into place. The anterior, posterior, and chamfer cuts were accomplished without difficulty followed by the Attune RP box cutting guide and the box cut. We also removed posterior osteophytes from the posterior femoral condyles. The posterior capsule was injected with Exparel solution. The knee was brought into full extension. We checked our extension gap and fit a 5 mm bearing. Distracting in extension with a lamina spreader,  bleeders in the posterior capsule, Posterior medial and posterior lateral gutter were cauterized.  The transexamic acid-soaked sponge was then placed in the gap of the knee in extension. The knee was flexed 30. The posterior patella cut was accomplished with the 9.5 mm Attune cutting guide, sized for a 44m dome, and the fixation pegs drilled.The knee was then once again hyperflexed exposing the proximal tibia. We sized for a # 7 tibial base plate, applied the smokestack and the conical reamer followed by the the Delta fin keel punch. We then hammered into place the Attune RP trial femoral component, drilled the lugs, inserted a  5 mm trial bearing, trial patellar button, and took the knee through range of motion from 0-130 degrees. Medial and lateral ligamentous stability was checked. No thumb pressure was required for patellar Tracking. The tourniquet was not used. All trial components were removed, mating surfaces irrigated with pulse lavage, and dried with suction and sponges. 10 cc of the Exparel solution was applied to the cancellus bone of the patella distal femur and proximal tibia.  After waiting 30 seconds, the bony surfaces were again, dried with sponges. A double batch of DePuy HV cement was mixed and applied to  all bony metallic mating surfaces except for the posterior condyles of the femur itself. In order, we hammered into place the tibial tray and removed excess cement, the femoral component and removed excess cement. The final Attune RP bearing was inserted, and the knee brought to full extension with compression. The patellar button was clamped into place, and excess cement removed. The knee was held at 30 flexion with compression, while the cement cured. The wound was irrigated out with normal saline solution pulse lavage. The rest of the Exparel was injected into the parapatellar arthrotomy, subcutaneous tissues, and periosteal tissues. The parapatellar arthrotomy was closed with running #1 Vicryl suture. The subcutaneous tissue with 3-0 undyed Vicryl suture, and the skin with running 3-0 SQ vicryl. An Aquacil and Ace wrap  were applied. The patient was taken to recovery room without difficulty.   Kerin Salen 03/07/2022, 11:12 AM

## 2022-03-07 NOTE — Anesthesia Procedure Notes (Signed)
Anesthesia Regional Block: Adductor canal block   Pre-Anesthetic Checklist: , timeout performed,  Correct Patient, Correct Site, Correct Laterality,  Correct Procedure, Correct Position, site marked,  Risks and benefits discussed,  Surgical consent,  Pre-op evaluation,  At surgeon's request and post-op pain management  Laterality: Right  Prep: chloraprep       Needles:  Injection technique: Single-shot  Needle Type: Stimulator Needle - 80          Additional Needles:   Procedures: Doppler guided,,,, ultrasound used (permanent image in chart),,    Narrative:  Start time: 03/07/2022 12:15 PM End time: 03/07/2022 12:30 PM Injection made incrementally with aspirations every 5 mL.  Performed by: Personally  Anesthesiologist: Belinda Block, MD

## 2022-03-07 NOTE — Discharge Instructions (Signed)

## 2022-03-08 ENCOUNTER — Encounter (HOSPITAL_COMMUNITY): Payer: Self-pay | Admitting: Orthopedic Surgery

## 2022-03-08 DIAGNOSIS — E119 Type 2 diabetes mellitus without complications: Secondary | ICD-10-CM | POA: Diagnosis not present

## 2022-03-08 DIAGNOSIS — I10 Essential (primary) hypertension: Secondary | ICD-10-CM | POA: Diagnosis not present

## 2022-03-08 DIAGNOSIS — I480 Paroxysmal atrial fibrillation: Secondary | ICD-10-CM | POA: Diagnosis not present

## 2022-03-08 DIAGNOSIS — Z96611 Presence of right artificial shoulder joint: Secondary | ICD-10-CM | POA: Diagnosis not present

## 2022-03-08 DIAGNOSIS — Z794 Long term (current) use of insulin: Secondary | ICD-10-CM | POA: Diagnosis not present

## 2022-03-08 DIAGNOSIS — Z87891 Personal history of nicotine dependence: Secondary | ICD-10-CM | POA: Diagnosis not present

## 2022-03-08 DIAGNOSIS — M1711 Unilateral primary osteoarthritis, right knee: Secondary | ICD-10-CM | POA: Diagnosis not present

## 2022-03-08 DIAGNOSIS — Z7901 Long term (current) use of anticoagulants: Secondary | ICD-10-CM | POA: Diagnosis not present

## 2022-03-08 DIAGNOSIS — J45909 Unspecified asthma, uncomplicated: Secondary | ICD-10-CM | POA: Diagnosis not present

## 2022-03-08 DIAGNOSIS — Z79899 Other long term (current) drug therapy: Secondary | ICD-10-CM | POA: Diagnosis not present

## 2022-03-08 LAB — GLUCOSE, CAPILLARY
Glucose-Capillary: 172 mg/dL — ABNORMAL HIGH (ref 70–99)
Glucose-Capillary: 197 mg/dL — ABNORMAL HIGH (ref 70–99)

## 2022-03-08 NOTE — Progress Notes (Signed)
Patient discharged to home w/ family. Given all belongings, instructions. Verbalized understanding of all instructions. States prescriptions have already been picked up. Escorted to pov via w/c.

## 2022-03-08 NOTE — Progress Notes (Signed)
Physical Therapy Treatment Patient Details Name: Tony Cantu MRN: [id number] DOB: [birth year] Today's Date: 03/08/2022   History of Present Illness Pt is 71 year old male s/p R TKA on 03/07/22 with history of bil shoulder surgeries and lumbar spine surgery    PT Comments    Pt ambulated in hallway and performed LE exercises.  Pt provided with HEP handout.  Pt and spouse had no further questions, and pt feels ready for d/c home today.   Recommendations for follow up therapy are one component of a multi-disciplinary discharge planning process, led by the attending physician.  Recommendations may be updated based on patient status, additional functional criteria and insurance authorization.  Follow Up Recommendations  Follow physician's recommendations for discharge plan and follow up therapies     Assistance Recommended at Discharge PRN  Patient can return home with the following Help with stairs or ramp for entrance   Equipment Recommendations  None recommended by PT    Recommendations for Other Services       Precautions / Restrictions Precautions Precautions: Fall;Knee Restrictions RLE Weight Bearing: Weight bearing as tolerated     Mobility  Bed Mobility Overal bed mobility: Needs Assistance Bed Mobility: Supine to Sit     Supine to sit: Min guard     General bed mobility comments: pt in recliner    Transfers Overall transfer level: Needs assistance Equipment used: Rolling walker (2 wheels) Transfers: Sit to/from Stand Sit to Stand: Min guard, Supervision           General transfer comment: verbal cues for positioning    Ambulation/Gait Ambulation/Gait assistance: Min guard, Supervision Gait Distance (Feet): 120 Feet Assistive device: Rolling walker (2 wheels) Gait Pattern/deviations: Step-to pattern, Decreased stance time - right, Antalgic, Step-through pattern       General Gait Details: verbal cues for sequence, RW positioning, step  length   Stairs Stairs: Yes Stairs assistance: Min guard Stair Management: Step to pattern, Forwards, With walker Number of Stairs: 1 General stair comments: verbal cues for technique, sequence, safety; spouse observed; pt reports understanding   Wheelchair Mobility    Modified Rankin (Stroke Patients Only)       Balance                                            Cognition Arousal/Alertness: Awake/alert Behavior During Therapy: WFL for tasks assessed/performed Overall Cognitive Status: Within Functional Limits for tasks assessed                                          Exercises Total Joint Exercises Ankle Circles/Pumps: AROM, Both, 10 reps Quad Sets: AROM, Both, 10 reps Heel Slides: AAROM, Right, 10 reps Hip ABduction/ADduction: AAROM, Right, 10 reps Straight Leg Raises: AAROM, Right, 10 reps Knee Flexion: AROM, Seated, Right, 10 reps    General Comments        Pertinent Vitals/Pain Pain Assessment Pain Assessment: 0-10 Pain Score: 3  Pain Location: right knee Pain Descriptors / Indicators: Sore, Aching Pain Intervention(s): Monitored during session, Repositioned, Premedicated before session    Home Living                          Prior Function  PT Goals (current goals can now be found in the care plan section) Progress towards PT goals: Progressing toward goals    Frequency    7X/week      PT Plan Current plan remains appropriate    Co-evaluation              AM-PAC PT "6 Clicks" Mobility   Outcome Measure  Help needed turning from your back to your side while in a flat bed without using bedrails?: A Little Help needed moving from lying on your back to sitting on the side of a flat bed without using bedrails?: A Little Help needed moving to and from a bed to a chair (including a wheelchair)?: A Little Help needed standing up from a chair using your arms (e.g., wheelchair or  bedside chair)?: A Little Help needed to walk in hospital room?: A Little Help needed climbing 3-5 steps with a railing? : A Little 6 Click Score: 18    End of Session Equipment Utilized During Treatment: Gait belt Activity Tolerance: Patient tolerated treatment well Patient left: in chair;with call bell/phone within reach;with family/visitor present   PT Visit Diagnosis: Other abnormalities of gait and mobility (R26.89)     Time: 0347-4259 PT Time Calculation (min) (ACUTE ONLY): 25 min  Charges:  $Gait Training: 8-22 mins $Therapeutic Exercise: 8-22 mins                     Tony Cantu PT, DPT Physical Therapist Acute Rehabilitation Services Preferred contact method: Secure Chat Weekend Pager Only: [phone] Office: [phone]    Tony Cantu [place] 03/08/2022, 4:05 PM

## 2022-03-08 NOTE — TOC Transition Note (Signed)
Transition of Care [facility]) - CM/SW Discharge Note   Patient Details  Name: Tony Cantu MRN: [id number] Date of Birth: [birth year]  [facility] CM/SW Contact:  Ross Ludwig, LCSW Phone Number: 03/08/2022, 12:13 PM   Clinical Narrative:     Patient will be discharging back home, and will be going to outpatient therapy.  CSW spoke to patient, and he said he does not need any equipment he has it at home already.  CSW signing off, please reconsult if other social work needs arise.  Final next level of care: OP Rehab Barriers to Discharge: Barriers Resolved   Patient Goals and CMS Choice Patient states their goals for this hospitalization and ongoing recovery are:: To return back home. CMS Medicare.gov Compare Post Acute Care list provided to:: Patient Choice offered to / list presented to : Patient  Discharge Placement                       Discharge Plan and Services                                     Social Determinants of Health (SDOH) Interventions     Readmission Risk Interventions     No data to display

## 2022-03-08 NOTE — Discharge Summary (Signed)
Patient ID: Tony Cantu MRN: [id number] DOB/AGE: [birth year] 71 y.o.  Admit date: 03/07/2022 Discharge date: 03/08/2022  Admission Diagnoses:  Principal Problem:   Osteoarthritis of right knee Active Problems:   S/P TKR (total knee replacement), right   Discharge Diagnoses:  Same  Past Medical History:  Diagnosis Date   Aortic atherosclerosis ([place])    Aortic stenosis    Arthritis    Asthma    BPH (benign prostatic hypertrophy)    Cirrhosis ([place]) 2016   Colon polyps    Diabetes mellitus without complication ([place])    TYPE 1    Diverticulosis    Dysrhythmia 04/19/2021   A-fib noted with possible A-fib RVR   Gastric ulcer    Gastric varices    right   Gastritis    GERD (gastroesophageal reflux disease)    Headache    History- pt states these were migraines that occurred in the 1980's   Heart murmur    History of kidney stones    Hx of adenomatous colonic polyps    Hypertension    PONV (postoperative nausea and vomiting)    pt states he has never had post op nausea or vomiting   Portal hypertensive gastropathy ([facility])    Renal cyst, right    Seizures ([facility])    HYPOGLYCEMIC LAST 1 AND 1/2 YRS AGO   Sleep apnea    uses CPAP nightly   Testicle trouble    one testicle BORN WITH   Tubular adenoma of colon     Surgeries: Procedure(s): RIGHT TOTAL KNEE ARTHROPLASTY on 03/07/2022   Consultants:   Discharged Condition: Improved  Hospital Course: Tony Cantu is an 71 y.o. male who was admitted 03/07/2022 for operative treatment ofOsteoarthritis of right knee. Patient has severe unremitting pain that affects sleep, daily activities, and work/hobbies. After pre-op clearance the patient was taken to the operating room on 03/07/2022 and underwent  Procedure(s): RIGHT TOTAL KNEE ARTHROPLASTY.    Patient was given perioperative antibiotics:  Anti-infectives (From admission, onward)    Start     Dose/Rate Route Frequency Ordered Stop   03/07/22 1130  ceFAZolin (ANCEF) IVPB  2g/100 mL premix        2 g 200 mL/hr over 30 Minutes Intravenous On call to O.R. 03/07/22 1115 03/07/22 1329        Patient was given sequential compression devices, early ambulation, and chemoprophylaxis to prevent DVT.  Patient benefited maximally from hospital stay and there were no complications.    Recent vital signs: Patient Vitals for the past 24 hrs:  BP Temp Temp src Pulse Resp SpO2  03/08/22 1340 (!) 113/59 98.2 F (36.8 C) Oral 65 16 97 %  03/08/22 0917 (!) 106/57 97.7 F (36.5 C) Oral 64 17 96 %  03/08/22 0619 120/68 97.8 F (36.6 C) -- 64 17 97 %  03/08/22 0209 123/76 97.7 F (36.5 C) Oral 74 17 97 %  03/07/22 2040 139/81 98 F (36.7 C) Oral 86 17 98 %  03/07/22 1801 137/72 97.7 F (36.5 C) Oral 65 14 100 %  03/07/22 1745 136/89 (!) 97 F (36.1 C) -- (!) 55 14 99 %  03/07/22 1730 132/78 -- -- 66 14 98 %  03/07/22 1715 130/79 -- -- 64 14 98 %  03/07/22 1700 134/77 98 F (36.7 C) -- 71 14 98 %     Recent laboratory studies: No results for input(s): "WBC", "HGB", "HCT", "PLT", "NA", "K", "CL", "CO2", "BUN", "CREATININE", "GLUCOSE", "INR", "CALCIUM" in  the last 72 hours.  Invalid input(s): "PT", "2"   Discharge Medications:   Allergies as of 03/08/2022       Reactions   Dimetapp Children's Cold-cough    Does not remember the reaction   Erythromycin Diarrhea   Sulfonamide Derivatives Diarrhea        Medication List     TAKE these medications    albuterol 108 (90 Base) MCG/ACT inhaler Commonly known as: VENTOLIN HFA Inhale 2 puffs into the lungs every 6 (six) hours as needed for wheezing or shortness of breath.   atorvastatin 40 MG tablet Commonly known as: LIPITOR TAKE 1 TABLET BY MOUTH DAILY   b complex vitamins tablet Take 1 tablet by mouth daily.   benzonatate 100 MG capsule Commonly known as: TESSALON Take 1 capsule (100 mg total) by mouth 3 (three) times daily as needed for cough.   Contour Next One Kit Test BS QID and as needed Dx  E11.9   Contour Next Test test strip Generic drug: glucose blood Test BS QID and as needed Dx E11.9   CoQ10 200 MG Caps Take 200 mg by mouth daily.   diltiazem 30 MG tablet Commonly known as: CARDIZEM TAKE 1 TABLET BY MOUTH  TWICE DAILY   Eliquis 5 MG Tabs tablet Generic drug: apixaban TAKE 1 TABLET BY MOUTH  TWICE DAILY   Eliquis 2.5 MG Tabs tablet Generic drug: apixaban Take 2.5 mg by mouth 2 (two) times daily.   FreeStyle Libre Reader Devi 1 applicator by Does not apply route as directed.   FreeStyle [employer] System Misc Check BS eight (8) times a day. Dx E10.9   furosemide 20 MG tablet Commonly known as: LASIX TAKE 1 AND 1/2 TABLETS BY MOUTH  DAILY   Glucagon Emergency 1 MG Kit INJECT AS DIRECTED INTO  UPPER ARM, THIGH OR  BUTTOCKS AS NEEDED FOR  SEVERE HYPOGLYCEMIA. SEEK  MEDICAL ATTENTION AFTER USE   Glucosamine 1500 Complex Caps Take 2 capsules by mouth daily.   insulin lispro 100 UNIT/ML injection Commonly known as: HumaLOG INJECT SUBCUTANEOUSLY 20 TO 50  UNITS 3 TIMES DAILY BEFORE MEALS PER SLIDING SCALE   MAGNESIUM OXIDE PO Take 500 mg by mouth daily.   Microlet Lancets Misc Test BS QID and as needed Dx E11.9   montelukast 10 MG tablet Commonly known as: SINGULAIR TAKE 1 TABLET BY MOUTH AT  BEDTIME   multivitamin-lutein Caps capsule Take 1 capsule by mouth daily.   ondansetron 4 MG tablet Commonly known as: ZOFRAN Take 4 mg by mouth every 6 (six) hours as needed for vomiting or nausea.   oxyCODONE-acetaminophen 5-325 MG tablet Commonly known as: PERCOCET/ROXICET Take 1 tablet by mouth every 4 (four) hours as needed for pain.   pantoprazole 40 MG tablet Commonly known as: PROTONIX Take 1 tablet (40 mg total) by mouth 2 (two) times daily before a meal.   Testosterone 30 MG/ACT Soln APPLY ONE PUMP TOPICALLY  UNDER EACH AXILLA DAILY  (TOTAL OF 60MG DAILY )   tiZANidine 2 MG tablet Commonly known as: ZANAFLEX Take 2 mg by mouth every 6  (six) hours as needed for muscle spasms.   Toujeo Max SoloStar 300 UNIT/ML Solostar Pen Generic drug: insulin glargine (2 Unit Dial) INJECT SUBCUTANEOUSLY 110 UNITS  DAILY What changed: See the new instructions.   Turmeric Curcumin 500 MG Caps Take 500 mg by mouth daily.   valsartan 40 MG tablet Commonly known as: DIOVAN Take 0.5 tablets (20 mg total) by mouth  daily.   Vitamin D3 50 MCG (2000 UT) capsule Take 2,000 Units by mouth daily.               Durable Medical Equipment  (From admission, onward)           Start     Ordered   03/07/22 1755  DME Walker rolling  Once       Question:  Patient needs a walker to treat with the following condition  Answer:  Status post right knee replacement   03/07/22 1754   03/07/22 1755  DME 3 n 1  Once        03/07/22 1754              Discharge Care Instructions  (From admission, onward)           Start     Ordered   03/08/22 0000  Change dressing       Comments: Change dressing Only if drainage exceeds 40% of window on dressing   03/08/22 0759            Diagnostic Studies: No results found.  Disposition: Discharge disposition: 01-Home or Self Care       Discharge Instructions     Call MD / Call 911   Complete by: As directed    If you experience chest pain or shortness of breath, CALL 911 and be transported to the hospital emergency room.  If you develope a fever above 101 F, pus (white drainage) or increased drainage or redness at the wound, or calf pain, call your surgeon's office.   Change dressing   Complete by: As directed    Change dressing Only if drainage exceeds 40% of window on dressing   Constipation Prevention   Complete by: As directed    Drink plenty of fluids.  Prune juice may be helpful.  You may use a stool softener, such as Colace (over the counter) 100 mg twice a day.  Use MiraLax (over the counter) for constipation as needed.   Diet - low sodium heart healthy   Complete by:  As directed    Increase activity slowly as tolerated   Complete by: As directed    Post-operative opioid taper instructions:   Complete by: As directed    POST-OPERATIVE OPIOID TAPER INSTRUCTIONS: It is important to wean off of your opioid medication as soon as possible. If you do not need pain medication after your surgery it is ok to stop day one. Opioids include: Codeine, Hydrocodone(Norco, Vicodin), Oxycodone(Percocet, oxycontin) and hydromorphone amongst others.  Long term and even short term use of opiods can cause: Increased pain response Dependence Constipation Depression Respiratory depression And more.  Withdrawal symptoms can include Flu like symptoms Nausea, vomiting And more Techniques to manage these symptoms Hydrate well Eat regular healthy meals Stay active Use relaxation techniques(deep breathing, meditating, yoga) Do Not substitute Alcohol to help with tapering If you have been on opioids for less than two weeks and do not have pain than it is ok to stop all together.  Plan to wean off of opioids This plan should start within one week post op of your joint replacement. Maintain the same interval or time between taking each dose and first decrease the dose.  Cut the total daily intake of opioids by one tablet each day Next start to increase the time between doses. The last dose that should be eliminated is the evening dose.  Follow-up Information     Frederik Pear, MD Follow up in 2 week(s).   Specialty: Orthopedic Surgery Contact information: [place] [id number] [phone]                  Signed: Kerin Salen 03/08/2022, 4:47 PM

## 2022-03-08 NOTE — Progress Notes (Signed)
PATIENT ID: Tony Cantu  MRN: [id number]  DOB/AGE:  04-05-1951 / 71 y.o.  1 Day Post-Op Procedure(s) (LRB): RIGHT TOTAL KNEE ARTHROPLASTY (Right)    PROGRESS NOTE Subjective: Patient is alert, oriented, no Nausea, no Vomiting, yes passing gas. Taking PO well. Denies SOB, Chest or Calf Pain. Using Incentive Spirometer, PAS in place. Ambulate WBAT, Patient reports pain as 2/10 .    Objective: Vital signs in last 24 hours: Vitals:   03/07/22 1801 03/07/22 2040 03/08/22 0209 03/08/22 0619  BP: 137/72 139/81 123/76 120/68  Pulse: 65 86 74 64  Resp: 14 17 17 17   Temp: 97.7 F (36.5 C) 98 F (36.7 C) 97.7 F (36.5 C) 97.8 F (36.6 C)  TempSrc: Oral Oral Oral   SpO2: 100% 98% 97% 97%  Weight:      Height:          Intake/Output from previous day: I/O last 3 completed shifts: In: 2104 [P.O.:840; I.V.:1064; IV Piggyback:200] Out: 5916 [Urine:1025; Blood:250]   Intake/Output this shift: No intake/output data recorded.   LABORATORY DATA: Recent Labs    03/07/22 1549 03/07/22 2043 03/08/22 0745  GLUCAP 168* 351* 172*    Examination: Neurologically intact ABD soft Neurovascular intact Sensation intact distally Intact pulses distally Dorsiflexion/Plantar flexion intact Incision: scant drainage No cellulitis present Compartment soft} Can easily do straight leg raise and flex knee to 80  Assessment:   1 Day Post-Op Procedure(s) (LRB): RIGHT TOTAL KNEE ARTHROPLASTY (Right) ADDITIONAL DIAGNOSIS: Expected Acute Blood Loss Anemia, Afib, OSA, Obesity, LE edema  Patient's anticipated LOS is less than 2 midnights, meeting these requirements: - Younger than 72 - Lives within 1 hour of care - Has a competent adult at home to recover with post-op recover - NO history of  - Chronic pain requiring opiods  - Diabetes  - Coronary Artery Disease  - Heart failure  - Heart attack  - Stroke  - DVT/VTE  - Cardiac arrhythmia  - Respiratory Failure/COPD  - Renal failure  -  Anemia  - Advanced Liver disease     Plan: PT/OT WBAT, AROM and PROM  DVT Prophylaxis:  SCDx72hrs, ASA 81 mg BID x 2 weeks DISCHARGE PLAN: Home DISCHARGE NEEDS: HHPT, Walker, and 3-in-1 comode seat     Kerin Salen 03/08/2022, 7:54 AM  Patient ID: Tony Cantu, male   DOB: 04/24/1951, 71 y.o.   MRN: [id number]

## 2022-03-08 NOTE — Evaluation (Signed)
Physical Therapy Evaluation Patient Details Name: Tony Cantu MRN: [id number] DOB: [birth year] Today's Date: 03/08/2022  History of Present Illness  Pt is 71 year old male s/p R TKA on 03/07/22 with history of bil shoulder surgeries and lumbar spine surgery  Clinical Impression  Pt is s/p TKA resulting in the deficits listed below (see PT Problem List).  Pt will benefit from skilled PT to increase their independence and safety with mobility to allow discharge to the venue listed below.  Pt ambulated in hallway and practiced step.  Pt anticipates d/c home later today.  Will return to ambulate again and perform HEP.  Pt plans to f/u with OPPT on Thursday.        Recommendations for follow up therapy are one component of a multi-disciplinary discharge planning process, led by the attending physician.  Recommendations may be updated based on patient status, additional functional criteria and insurance authorization.  Follow Up Recommendations Follow physician's recommendations for discharge plan and follow up therapies      Assistance Recommended at Discharge PRN  Patient can return home with the following  Help with stairs or ramp for entrance    Equipment Recommendations None recommended by PT  Recommendations for Other Services       Functional Status Assessment Patient has had a recent decline in their functional status and demonstrates the ability to make significant improvements in function in a reasonable and predictable amount of time.     Precautions / Restrictions Precautions Precautions: Fall;Knee Restrictions RLE Weight Bearing: Weight bearing as tolerated      Mobility  Bed Mobility Overal bed mobility: Needs Assistance Bed Mobility: Supine to Sit     Supine to sit: Min guard     General bed mobility comments: min/guard for safety    Transfers Overall transfer level: Needs assistance Equipment used: Rolling walker (2 wheels) Transfers: Sit to/from  Stand Sit to Stand: Min guard           General transfer comment: verbal cues for positioning    Ambulation/Gait Ambulation/Gait assistance: Min guard Gait Distance (Feet): 120 Feet Assistive device: Rolling walker (2 wheels) Gait Pattern/deviations: Step-to pattern, Decreased stance time - right, Antalgic       General Gait Details: verbal cues for sequence, RW positioning, step length  Stairs Stairs: Yes Stairs assistance: Min guard Stair Management: Step to pattern, Forwards, With walker Number of Stairs: 1 General stair comments: verbal cues for technique, sequence, safety; spouse observed; pt reports understanding  Wheelchair Mobility    Modified Rankin (Stroke Patients Only)       Balance                                             Pertinent Vitals/Pain Pain Assessment Pain Assessment: 0-10 Pain Score: 2  Pain Location: right knee Pain Descriptors / Indicators: Sore, Aching Pain Intervention(s): Monitored during session, Repositioned    Home Living Family/patient expects to be discharged to:: Private residence Living Arrangements: Spouse/significant other Available Help at Discharge: Family Type of Home: House Home Access: Stairs to enter Entrance Stairs-Rails: None Entrance Stairs-Number of Steps: 1+1   Home Layout: Able to live on main level with bedroom/bathroom Home Equipment: Conservation officer, nature (2 wheels);Standard Walker      Prior Function Prior Level of Function : Independent/Modified Independent  Hand Dominance        Extremity/Trunk Assessment        Lower Extremity Assessment Lower Extremity Assessment: RLE deficits/detail RLE Deficits / Details: able to perform SLR, ankle pumps       Communication   Communication: No difficulties  Cognition Arousal/Alertness: Awake/alert Behavior During Therapy: WFL for tasks assessed/performed Overall Cognitive Status: Within Functional Limits  for tasks assessed                                          General Comments      Exercises     Assessment/Plan    PT Assessment Patient needs continued PT services  PT Problem List Decreased strength;Decreased range of motion;Decreased mobility;Decreased knowledge of use of DME;Pain       PT Treatment Interventions Gait training;DME instruction;Therapeutic exercise;Balance training;Stair training;Functional mobility training;Patient/family education;Therapeutic activities    PT Goals (Current goals can be found in the Care Plan section)  Acute Rehab PT Goals PT Goal Formulation: With patient Time For Goal Achievement: 03/12/22 Potential to Achieve Goals: Good    Frequency 7X/week     Co-evaluation               AM-PAC PT "6 Clicks" Mobility  Outcome Measure Help needed turning from your back to your side while in a flat bed without using bedrails?: A Little Help needed moving from lying on your back to sitting on the side of a flat bed without using bedrails?: A Little Help needed moving to and from a bed to a chair (including a wheelchair)?: A Little Help needed standing up from a chair using your arms (e.g., wheelchair or bedside chair)?: A Little Help needed to walk in hospital room?: A Little Help needed climbing 3-5 steps with a railing? : A Little 6 Click Score: 18    End of Session Equipment Utilized During Treatment: Gait belt Activity Tolerance: Patient tolerated treatment well Patient left: in chair;with call bell/phone within reach;with chair alarm set;with family/visitor present   PT Visit Diagnosis: Other abnormalities of gait and mobility (R26.89)    Time: 6333-5456 PT Time Calculation (min) (ACUTE ONLY): 16 min   Charges:   PT Evaluation $PT Eval Low Complexity: 1 Low        Kati PT, DPT Physical Therapist Acute Rehabilitation Services Preferred contact method: Secure Chat Weekend Pager Only: [phone] Office:  [phone]   Myrtis Hopping [place] 03/08/2022, 11:48 AM

## 2022-03-08 NOTE — Progress Notes (Signed)
Orthopedic Tech Progress Note Patient Details:  Tony Cantu 1951-03-24 [id number]  Ortho Devices Type of Ortho Device: Bone foam zero knee Ortho Device/Splint Location: RLE Ortho Device/Splint Interventions: Ordered, Application   Post Interventions Patient Tolerated: Well Instructions Provided: Care of device  [name] 03/08/2022, 9:33 AM

## 2022-03-09 ENCOUNTER — Encounter: Payer: BC Managed Care – PPO | Admitting: Physical Therapy

## 2022-03-09 ENCOUNTER — Ambulatory Visit: Payer: BC Managed Care – PPO | Admitting: Cardiology

## 2022-03-10 ENCOUNTER — Other Ambulatory Visit: Payer: Self-pay

## 2022-03-10 ENCOUNTER — Emergency Department (HOSPITAL_COMMUNITY): Payer: BC Managed Care – PPO

## 2022-03-10 ENCOUNTER — Observation Stay (HOSPITAL_COMMUNITY)
Admission: EM | Admit: 2022-03-10 | Discharge: 2022-03-12 | Disposition: A | Discharge status: Home / Self Care | Payer: BC Managed Care – PPO | Attending: Internal Medicine | Admitting: Internal Medicine

## 2022-03-10 ENCOUNTER — Encounter (HOSPITAL_COMMUNITY): Payer: Self-pay

## 2022-03-10 ENCOUNTER — Ambulatory Visit: Payer: BC Managed Care – PPO

## 2022-03-10 DIAGNOSIS — Z794 Long term (current) use of insulin: Secondary | ICD-10-CM | POA: Insufficient documentation

## 2022-03-10 DIAGNOSIS — Z9682 Presence of neurostimulator: Secondary | ICD-10-CM | POA: Insufficient documentation

## 2022-03-10 DIAGNOSIS — R5082 Postprocedural fever: Principal | ICD-10-CM | POA: Diagnosis present

## 2022-03-10 DIAGNOSIS — A419 Sepsis, unspecified organism: Secondary | ICD-10-CM | POA: Diagnosis not present

## 2022-03-10 DIAGNOSIS — Z96651 Presence of right artificial knee joint: Secondary | ICD-10-CM

## 2022-03-10 DIAGNOSIS — Y831 Surgical operation with implant of artificial internal device as the cause of abnormal reaction of the patient, or of later complication, without mention of misadventure at the time of the procedure: Secondary | ICD-10-CM | POA: Diagnosis not present

## 2022-03-10 DIAGNOSIS — Z87891 Personal history of nicotine dependence: Secondary | ICD-10-CM | POA: Diagnosis not present

## 2022-03-10 DIAGNOSIS — Z7901 Long term (current) use of anticoagulants: Secondary | ICD-10-CM | POA: Insufficient documentation

## 2022-03-10 DIAGNOSIS — J45909 Unspecified asthma, uncomplicated: Secondary | ICD-10-CM | POA: Insufficient documentation

## 2022-03-10 DIAGNOSIS — J811 Chronic pulmonary edema: Secondary | ICD-10-CM | POA: Diagnosis not present

## 2022-03-10 DIAGNOSIS — I700 Atherosclerosis of aorta: Secondary | ICD-10-CM | POA: Diagnosis present

## 2022-03-10 DIAGNOSIS — T8149XA Infection following a procedure, other surgical site, initial encounter: Secondary | ICD-10-CM | POA: Diagnosis not present

## 2022-03-10 DIAGNOSIS — E1122 Type 2 diabetes mellitus with diabetic chronic kidney disease: Secondary | ICD-10-CM

## 2022-03-10 DIAGNOSIS — E119 Type 2 diabetes mellitus without complications: Secondary | ICD-10-CM

## 2022-03-10 DIAGNOSIS — I480 Paroxysmal atrial fibrillation: Secondary | ICD-10-CM | POA: Diagnosis not present

## 2022-03-10 DIAGNOSIS — E785 Hyperlipidemia, unspecified: Secondary | ICD-10-CM | POA: Insufficient documentation

## 2022-03-10 DIAGNOSIS — D696 Thrombocytopenia, unspecified: Secondary | ICD-10-CM | POA: Diagnosis present

## 2022-03-10 DIAGNOSIS — Z79899 Other long term (current) drug therapy: Secondary | ICD-10-CM | POA: Diagnosis not present

## 2022-03-10 DIAGNOSIS — T8453XA Infection and inflammatory reaction due to internal right knee prosthesis, initial encounter: Secondary | ICD-10-CM | POA: Insufficient documentation

## 2022-03-10 DIAGNOSIS — I479 Paroxysmal tachycardia, unspecified: Secondary | ICD-10-CM | POA: Diagnosis present

## 2022-03-10 DIAGNOSIS — I5189 Other ill-defined heart diseases: Secondary | ICD-10-CM

## 2022-03-10 DIAGNOSIS — M25561 Pain in right knee: Secondary | ICD-10-CM | POA: Diagnosis present

## 2022-03-10 DIAGNOSIS — E1169 Type 2 diabetes mellitus with other specified complication: Secondary | ICD-10-CM | POA: Insufficient documentation

## 2022-03-10 DIAGNOSIS — T8140XA Infection following a procedure, unspecified, initial encounter: Secondary | ICD-10-CM

## 2022-03-10 DIAGNOSIS — Z96611 Presence of right artificial shoulder joint: Secondary | ICD-10-CM | POA: Insufficient documentation

## 2022-03-10 DIAGNOSIS — G4733 Obstructive sleep apnea (adult) (pediatric): Secondary | ICD-10-CM | POA: Diagnosis present

## 2022-03-10 HISTORY — DX: Postprocedural fever: R50.82

## 2022-03-10 LAB — BASIC METABOLIC PANEL
Anion gap: 9 (ref 5–15)
BUN: 13 mg/dL (ref 8–23)
CO2: 26 mmol/L (ref 22–32)
Calcium: 9.1 mg/dL (ref 8.9–10.3)
Chloride: 104 mmol/L (ref 98–111)
Creatinine, Ser: 0.99 mg/dL (ref 0.61–1.24)
GFR, Estimated: >60 mL/min (ref >60)
Glucose, Bld: 143 mg/dL — ABNORMAL HIGH (ref 70–99)
Potassium: 4.2 mmol/L (ref 3.5–5.1)
Sodium: 139 mmol/L (ref 135–145)

## 2022-03-10 LAB — URINALYSIS, ROUTINE W REFLEX MICROSCOPIC
Bilirubin Urine: NEGATIVE
Glucose, UA: NEGATIVE mg/dL
Hgb urine dipstick: NEGATIVE
Ketones, ur: NEGATIVE mg/dL
Leukocytes,Ua: NEGATIVE
Nitrite: NEGATIVE
Protein, ur: NEGATIVE mg/dL
Specific Gravity, Urine: 1.013 (ref 1.005–1.030)
pH: 6 (ref 5.0–8.0)

## 2022-03-10 LAB — CBC WITH DIFFERENTIAL/PLATELET
Abs Immature Granulocytes: 0.06 10*3/uL (ref 0.00–0.07)
Basophils Absolute: 0 10*3/uL (ref 0.0–0.1)
Basophils Relative: 0 %
Eosinophils Absolute: 0.1 10*3/uL (ref 0.0–0.5)
Eosinophils Relative: 1 %
HCT: 43.9 % (ref 39.0–52.0)
Hemoglobin: 14.6 g/dL (ref 13.0–17.0)
Immature Granulocytes: 1 %
Lymphocytes Relative: 14 %
Lymphs Abs: 1.4 10*3/uL (ref 0.7–4.0)
MCH: 35.4 pg — ABNORMAL HIGH (ref 26.0–34.0)
MCHC: 33.3 g/dL (ref 30.0–36.0)
MCV: 106.3 fL — ABNORMAL HIGH (ref 80.0–100.0)
Monocytes Absolute: 1.9 10*3/uL — ABNORMAL HIGH (ref 0.1–1.0)
Monocytes Relative: 19 %
Neutro Abs: 6.6 10*3/uL (ref 1.7–7.7)
Neutrophils Relative %: 65 %
Platelets: 93 10*3/uL — ABNORMAL LOW (ref 150–400)
RBC: 4.13 MIL/uL — ABNORMAL LOW (ref 4.22–5.81)
RDW: 14.4 % (ref 11.5–15.5)
WBC: 10 10*3/uL (ref 4.0–10.5)
nRBC: 0 % (ref 0.0–0.2)

## 2022-03-10 LAB — LACTIC ACID, PLASMA
Lactic Acid, Venous: 3.8 mmol/L (ref 0.5–1.9)
Lactic Acid, Venous: 2.8 mmol/L (ref 0.5–1.9)

## 2022-03-10 LAB — SEDIMENTATION RATE: Sed Rate: 24 mm/hr — ABNORMAL HIGH (ref 0–16)

## 2022-03-10 MED ORDER — OXYCODONE HCL 5 MG PO TABS: 5.0000 mg | ORAL_TABLET | ORAL | Status: DC | PRN

## 2022-03-10 MED ORDER — LACTATED RINGERS IV BOLUS
1000.0000 mL | Freq: Once | INTRAVENOUS | Status: AC
  Administered 2022-03-10: 1000 mL via INTRAVENOUS

## 2022-03-10 MED ORDER — ACETAMINOPHEN 325 MG PO TABS
650.0000 mg | ORAL_TABLET | Freq: Four times a day (QID) | ORAL | Status: DC | PRN
  Administered 2022-03-11 – 2022-03-12 (×2): 650 mg via ORAL
  Filled 2022-03-10 (×2): qty 2

## 2022-03-10 MED ORDER — METHOCARBAMOL 500 MG PO TABS
500.0000 mg | ORAL_TABLET | Freq: Four times a day (QID) | ORAL | Status: DC | PRN
Start: 2022-03-10 — End: 2022-03-12
  Administered 2022-03-11 (×3): 500 mg via ORAL
  Filled 2022-03-10 (×3): qty 1

## 2022-03-10 NOTE — ED Notes (Signed)
Pt care taken, in bed , no complaints at this time

## 2022-03-10 NOTE — ED Triage Notes (Signed)
Pt presents to ED from home with c/o increased right knee pain and fever following total right knee replacement on Monday. Denies N/V/D. Right knee is inflammed, red, and warm to touch.

## 2022-03-10 NOTE — ED Provider Notes (Signed)
[facility] DEPT Provider Note   CSN: [id number] Arrival date & time: 03/10/22  2004     History  Chief Complaint  Patient presents with   Post-op Problem    Tony Cantu is a 71 y.o. male.  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  Presents today due to right knee pain and redness.  Is been increasingly more swollen and red and warm over the last 2 days.  Unable to bear any weight secondary to pain.  He had a fever today of 102, took Tylenol, called orthopedics who told him to go to ED for further evaluation.  Surgery was performed by Dr. Frederik Pear with [facility].  Patient takes Eliquis, not on any antibiotics.  Home Medications Prior to Admission medications   Medication Sig Start Date End Date Taking? Authorizing Provider  atorvastatin (LIPITOR) 40 MG tablet TAKE 1 TABLET BY MOUTH DAILY Patient taking differently: Take 40 mg by mouth daily. 12/23/21  Yes Ronnie Doss M, DO  b complex vitamins tablet Take 1 tablet by mouth daily.   Yes [provider]  Cholecalciferol (VITAMIN D3) 2000 UNITS capsule Take 2,000 Units by mouth daily.   Yes [provider]  Coenzyme Q10 (COQ10) 200 MG CAPS Take 200 mg by mouth daily.   Yes [provider]  diltiazem (CARDIZEM) 30 MG tablet TAKE 1 TABLET BY MOUTH  TWICE DAILY Patient taking differently: Take 30 mg by mouth 2 (two) times daily. 03/02/22  Yes Minus Breeding, MD  ELIQUIS 2.5 MG TABS tablet Take 2.5 mg by mouth 2 (two) times daily. 02/16/22  Yes [provider]  furosemide (LASIX) 20 MG tablet TAKE 1 AND 1/2 TABLETS BY MOUTH  DAILY Patient taking differently: Take 30 mg by mouth daily. 12/23/21  Yes Gottschalk, Ashly M, DO  Glucagon, rDNA, (GLUCAGON EMERGENCY) 1 MG KIT INJECT AS DIRECTED INTO  UPPER ARM, THIGH OR  BUTTOCKS AS NEEDED FOR  SEVERE HYPOGLYCEMIA. SEEK   MEDICAL ATTENTION AFTER USE Patient taking differently: Inject 1 mg into the muscle as needed (HYPOGLYCEMIA). 06/14/21  Yes Hendricks Limes F, FNP  Glucosamine-Chondroit-Vit C-Mn (GLUCOSAMINE 1500 COMPLEX) CAPS Take 2 capsules by mouth daily.   Yes [provider]  insulin lispro (HUMALOG) 100 UNIT/ML injection INJECT SUBCUTANEOUSLY 20 TO 50  UNITS 3 TIMES DAILY BEFORE MEALS PER SLIDING SCALE 01/21/22  Yes Gottschalk, Ashly M, DO  MAGNESIUM OXIDE PO Take 500 mg by mouth daily.   Yes [provider]  montelukast (SINGULAIR) 10 MG tablet TAKE 1 TABLET BY MOUTH AT  BEDTIME Patient taking differently: Take 10 mg by mouth at bedtime. 09/30/21  Yes Gottschalk, Leatrice Jewels M, DO  multivitamin-lutein (OCUVITE-LUTEIN) CAPS capsule Take 1 capsule by mouth daily.   Yes [provider]  oxyCODONE-acetaminophen (PERCOCET/ROXICET) 5-325 MG tablet Take 1 tablet by mouth every 4 (four) hours as needed for pain. 02/16/22  Yes [provider]  pantoprazole (PROTONIX) 40 MG tablet Take 1 tablet (40 mg total) by mouth 2 (two) times daily before a meal. 07/07/21  Yes Pyrtle, Lajuan Lines, MD  tiZANidine (ZANAFLEX) 2 MG tablet Take 2 mg by mouth every 6 (six) hours as needed for muscle spasms. 02/16/22  Yes [provider]  TOUJEO MAX SOLOSTAR 300 UNIT/ML Solostar Pen INJECT SUBCUTANEOUSLY 110 UNITS  DAILY Patient taking differently: Inject 60 Units into the skin daily. 01/20/22  Yes [name], DO  Turmeric Curcumin 500 MG CAPS Take 500 mg by mouth daily.   Yes [provider]  albuterol (VENTOLIN HFA) 108 (90 Base) MCG/ACT inhaler Inhale 2 puffs into the lungs every 6 (six) hours as needed for wheezing or shortness of breath. Patient not taking: Reported on 03/10/2022 02/18/20   [name], DO  benzonatate (TESSALON) 100 MG capsule Take 1 capsule (100 mg total) by mouth 3 (three) times daily as needed for cough. Patient not taking: Reported on 02/24/2022 01/12/22   Brunetta Jeans, PA-C  Blood Glucose Monitoring Suppl (CONTOUR NEXT ONE) KIT Test BS QID and as needed Dx E11.9 06/04/20   [name], DO  Continuous Blood Gluc Receiver (FREESTYLE LIBRE READER) DEVI 1 applicator by Does not apply route as directed. 08/10/17   Chipper Herb, MD  Continuous Blood Gluc Sensor (FREESTYLE LIBRE SENSOR SYSTEM) MISC Check BS eight (8) times a day. Dx E10.9 06/02/21   Ronnie Doss M, DO  ELIQUIS 5 MG TABS tablet TAKE 1 TABLET BY MOUTH  TWICE DAILY Patient not taking: Reported on 03/10/2022 01/13/22   Deberah Pelton, NP  glucose blood (CONTOUR NEXT TEST) test strip Test BS QID and as needed Dx E11.9 11/08/21   [name], DO  Microlet Lancets MISC Test BS QID and as needed Dx E11.9 06/04/20   Ronnie Doss M, DO  ondansetron (ZOFRAN) 4 MG tablet Take 4 mg by mouth every 6 (six) hours as needed for vomiting or nausea. 02/16/22   [provider]  Testosterone 30 MG/ACT SOLN APPLY ONE PUMP TOPICALLY  UNDER EACH AXILLA DAILY  (TOTAL OF 60MG DAILY ) Patient not taking: Reported on 03/10/2022 09/18/20   [name], DO  valsartan (DIOVAN) 40 MG tablet Take 0.5 tablets (20 mg total) by mouth daily. Patient not taking: Reported on 02/24/2022 01/31/22   [name], DO      Allergies    Dimetapp children's cold-cough, Erythromycin, and Sulfonamide derivatives    Review of Systems   Review of Systems  Physical Exam Updated Vital Signs BP 101/70   Pulse 74   Temp 98.4 F (36.9 C) (Oral)   Resp 18   SpO2 95%  Physical Exam Vitals and nursing note reviewed. Exam conducted with a chaperone present.  Constitutional:      Appearance: Normal appearance.  HENT:     Head: Normocephalic and atraumatic.  Eyes:     General: No scleral icterus.       Right eye: No discharge.        Left eye: No discharge.     Extraocular Movements: Extraocular movements intact.     Pupils: Pupils are equal, round, and reactive to light.   Cardiovascular:     Rate and Rhythm: Normal rate and regular rhythm.     Pulses: Normal pulses.     Heart sounds: Normal heart sounds. No murmur heard.    No friction rub. No gallop.  Pulmonary:     Effort: Pulmonary effort is normal. No respiratory distress.     Breath sounds: Normal breath sounds.  Abdominal:     General: Abdomen is flat. Bowel sounds are normal. There is no distension.     Palpations: Abdomen is soft.     Tenderness: There is no abdominal tenderness.  Musculoskeletal:        General: Swelling and tenderness present.     Comments: Compartments are soft, significantly decreased ROM to right knee secondary to pain  Skin:    General: Skin is warm and dry.     Capillary Refill: Capillary refill takes less than 2 seconds.     Coloration: Skin is not jaundiced.     Findings: Erythema present.     Comments: Postop surgical scar in place to right knee.  No surrounding purulence, erythematous and warm. see photo   Neurological:     Mental Status: He is alert. Mental status is at baseline.     Coordination: Coordination normal.       ED Results / Procedures / Treatments   Labs (all labs ordered are listed, but only abnormal results are displayed) Labs Reviewed  CBC WITH DIFFERENTIAL/PLATELET - Abnormal; Notable for the following components:      Result Value   RBC 4.13 (*)    MCV 106.3 (*)    MCH 35.4 (*)    Platelets 93 (*)    Monocytes Absolute 1.9 (*)    All other components within normal limits  BASIC METABOLIC PANEL - Abnormal; Notable for the following components:   Glucose, Bld 143 (*)    All other components within normal limits  LACTIC ACID, PLASMA - Abnormal; Notable for the following components:   Lactic Acid, Venous 3.8 (*)    All other components within normal limits  CULTURE, BLOOD (ROUTINE X 2)  CULTURE, BLOOD (ROUTINE X 2)  LACTIC ACID, PLASMA  URINALYSIS, ROUTINE W REFLEX MICROSCOPIC  SEDIMENTATION RATE  C-REACTIVE PROTEIN     EKG None  Radiology DG Knee Complete 4 Views Right  Result Date: 03/10/2022 CLINICAL DATA:  Surgery, fever.  Knee surgery on 07/10. EXAM: RIGHT KNEE - COMPLETE 4+ VIEW COMPARISON:  None Available. FINDINGS: There is a right knee total arthroplasty in anatomic alignment. There is no fracture. There is no hardware loosening. There is diffuse soft tissue swelling of the anterior knee with small amount of soft tissue air. IMPRESSION: 1. Anterior soft tissue swelling with small amount of air, most likely related to recent surgery. Please correlate clinically for infection. 2. Right knee arthroplasty appears uncomplicated. Electronically Signed   By: Ronney Asters M.D.   On: 03/10/2022 21:22    Procedures Procedures    Medications Ordered in ED Medications  acetaminophen (TYLENOL) tablet 650 mg (has no administration in time range)  oxyCODONE (Oxy IR/ROXICODONE) immediate release tablet 5 mg (has no administration in time range)  methocarbamol (ROBAXIN) tablet 500 mg (has no administration in time range)  lactated ringers bolus 1,000 mL (1,000 mLs Intravenous New Bag/Given 03/10/22 2236)    ED Course/ Medical Decision Making/ A&P                           Medical Decision Making Amount and/or Complexity of Data Reviewed Labs: ordered. Radiology: ordered.  Risk Decision regarding hospitalization.   Patient presents due to right knee pain postoperatively.  It is red and erythematous, he had a fever today.  Differential includes but not limited to the infection, sepsis, septic joint, DVT or PE.  On exam his vitals are reassuring, not tachycardic.  He did take Tylenol prior to arrival and is not currently febrile.  His right knee is erythematous, swollen and has significantly decreased ROM secondary to pain and swelling.  I ordered and reviewed laboratory work-up.  Per my interpretation patient does not have a leukocytosis, no underlying anemia.  BMP shows some mild hyperglycemia at  143 but no indications of DKA.  Lactic acid was  notably elevated at 3.8.  I reviewed patient's medication list, he is on Eliquis.  There is no calf tenderness, does not appear to be consistent with a DVT.  I ordered fluid bolus.  After speaking with orthopedics we will hold off on antibiotics at this time.  I added CRP and sed rate patient that conversation.  We will also check UA and chest x-ray to evaluate for possible alternative source for fever although I think it is most likely secondary to the infection.  Spoke with Joanell Rising, PA with [facility] orthopedics.  They are aware the patient, advised admission to medicine, abstaining from antibiotics until blood culture results, they will see in the morning.  I consulted the hospitalist Dr. Myna Hidalgo.  He accepts the patient.        Final Clinical Impression(s) / ED Diagnoses Final diagnoses:  Postoperative infection, unspecified type, initial encounter    Rx / DC Orders ED Discharge Orders     None         Sherrill Raring, PA-C 03/10/22 [place], [place], DO 03/10/22 2320

## 2022-03-10 NOTE — ED Provider Triage Note (Signed)
Emergency Medicine Provider Triage Evaluation Note  Tony Cantu , a 71 y.o. male  was evaluated in triage.  Pt complains of fever.  He had right total knee replacement by Dr. Mayer Camel with orthopedics.  Noted today fever 102.0.  No chest pain, shortness of breath, cough.  States he has noted increased redness, warmth and swelling to the knee.  Review of Systems  Positive: fever Negative:   Physical Exam  BP (!) 109/56 (BP Location: Left Arm)   Pulse 75   Temp 98.8 F (37.1 C) (Oral)   Resp 16   SpO2 93%  Gen:   Awake, no distress   Resp:  Normal effort  MSK:   Right anterior knee incision closed.  There is surrounding erythema, warmth and swelling Other:    Medical Decision Making  Medically screening exam initiated at 8:18 PM.  Appropriate orders placed.  Mickel Schreur Barlowe was informed that the remainder of the evaluation will be completed by another provider, this initial triage assessment does not replace that evaluation, and the importance of remaining in the ED until their evaluation is complete.  Fever, post op   Mekaela Azizi A, PA-C 03/10/22 2019

## 2022-03-11 ENCOUNTER — Telehealth: Payer: Self-pay

## 2022-03-11 ENCOUNTER — Observation Stay (HOSPITAL_BASED_OUTPATIENT_CLINIC_OR_DEPARTMENT_OTHER): Payer: BC Managed Care – PPO

## 2022-03-11 ENCOUNTER — Encounter: Payer: BC Managed Care – PPO | Admitting: *Deleted

## 2022-03-11 DIAGNOSIS — R609 Edema, unspecified: Secondary | ICD-10-CM

## 2022-03-11 DIAGNOSIS — R52 Pain, unspecified: Secondary | ICD-10-CM | POA: Diagnosis not present

## 2022-03-11 DIAGNOSIS — I5189 Other ill-defined heart diseases: Secondary | ICD-10-CM

## 2022-03-11 DIAGNOSIS — R5082 Postprocedural fever: Principal | ICD-10-CM

## 2022-03-11 DIAGNOSIS — L538 Other specified erythematous conditions: Secondary | ICD-10-CM | POA: Diagnosis not present

## 2022-03-11 LAB — COMPREHENSIVE METABOLIC PANEL
ALT: 33 U/L (ref 0–44)
AST: 33 U/L (ref 15–41)
Albumin: 2.6 g/dL — ABNORMAL LOW (ref 3.5–5.0)
Alkaline Phosphatase: 65 U/L (ref 38–126)
Anion gap: 8 (ref 5–15)
BUN: 14 mg/dL (ref 8–23)
CO2: 23 mmol/L (ref 22–32)
Calcium: 8.4 mg/dL — ABNORMAL LOW (ref 8.9–10.3)
Chloride: 105 mmol/L (ref 98–111)
Creatinine, Ser: 0.87 mg/dL (ref 0.61–1.24)
GFR, Estimated: >60 mL/min (ref >60)
Glucose, Bld: 348 mg/dL — ABNORMAL HIGH (ref 70–99)
Potassium: 4.4 mmol/L (ref 3.5–5.1)
Sodium: 136 mmol/L (ref 135–145)
Total Bilirubin: 2.4 mg/dL — ABNORMAL HIGH (ref 0.3–1.2)
Total Protein: 6 g/dL — ABNORMAL LOW (ref 6.5–8.1)

## 2022-03-11 LAB — C-REACTIVE PROTEIN: CRP: 12.3 mg/dL — ABNORMAL HIGH (ref <1.0)

## 2022-03-11 LAB — CBC
HCT: 35.7 % — ABNORMAL LOW (ref 39.0–52.0)
Hemoglobin: 12.3 g/dL — ABNORMAL LOW (ref 13.0–17.0)
MCH: 36.4 pg — ABNORMAL HIGH (ref 26.0–34.0)
MCHC: 34.5 g/dL (ref 30.0–36.0)
MCV: 105.6 fL — ABNORMAL HIGH (ref 80.0–100.0)
Platelets: 85 10*3/uL — ABNORMAL LOW (ref 150–400)
RBC: 3.38 MIL/uL — ABNORMAL LOW (ref 4.22–5.81)
RDW: 14.3 % (ref 11.5–15.5)
WBC: 6.9 10*3/uL (ref 4.0–10.5)
nRBC: 0 % (ref 0.0–0.2)

## 2022-03-11 LAB — CBG MONITORING, ED
Glucose-Capillary: 373 mg/dL — ABNORMAL HIGH (ref 70–99)
Glucose-Capillary: 351 mg/dL — ABNORMAL HIGH (ref 70–99)
Glucose-Capillary: 224 mg/dL — ABNORMAL HIGH (ref 70–99)

## 2022-03-11 LAB — GLUCOSE, CAPILLARY: Glucose-Capillary: 86 mg/dL (ref 70–99)

## 2022-03-11 LAB — LACTIC ACID, PLASMA: Lactic Acid, Venous: 2 mmol/L (ref 0.5–1.9)

## 2022-03-11 MED ORDER — INSULIN GLARGINE (2 UNIT DIAL) 300 UNIT/ML ~~LOC~~ SOPN: 60.0000 [IU] | PEN_INJECTOR | Freq: Every day | SUBCUTANEOUS | Status: DC

## 2022-03-11 MED ORDER — OXYCODONE HCL 5 MG PO TABS
5.0000 mg | ORAL_TABLET | ORAL | Status: DC | PRN
  Administered 2022-03-11 – 2022-03-12 (×2): 5 mg via ORAL
  Filled 2022-03-11 (×2): qty 1

## 2022-03-11 MED ORDER — MAGNESIUM OXIDE -MG SUPPLEMENT 400 (240 MG) MG PO TABS
400.0000 mg | ORAL_TABLET | Freq: Every day | ORAL | Status: DC
  Administered 2022-03-11 – 2022-03-12 (×2): 400 mg via ORAL
  Filled 2022-03-11 (×2): qty 1

## 2022-03-11 MED ORDER — ONDANSETRON HCL 4 MG PO TABS: 4.0000 mg | ORAL_TABLET | Freq: Four times a day (QID) | ORAL | Status: DC | PRN

## 2022-03-11 MED ORDER — MAGNESIUM OXIDE -MG SUPPLEMENT 400 (240 MG) MG PO TABS: 400.0000 mg | ORAL_TABLET | Freq: Every day | ORAL | Status: DC

## 2022-03-11 MED ORDER — PANTOPRAZOLE SODIUM 40 MG PO TBEC
40.0000 mg | DELAYED_RELEASE_TABLET | Freq: Two times a day (BID) | ORAL | Status: DC
  Administered 2022-03-11 – 2022-03-12 (×3): 40 mg via ORAL
  Filled 2022-03-11 (×3): qty 1

## 2022-03-11 MED ORDER — LACTATED RINGERS IV SOLN: INTRAVENOUS | Status: DC

## 2022-03-11 MED ORDER — HYDROMORPHONE HCL 1 MG/ML IJ SOLN
1.0000 mg | Freq: Once | INTRAMUSCULAR | Status: AC
  Administered 2022-03-11: 1 mg via INTRAVENOUS
  Filled 2022-03-11: qty 1

## 2022-03-11 MED ORDER — MONTELUKAST SODIUM 10 MG PO TABS
10.0000 mg | ORAL_TABLET | Freq: Every day | ORAL | Status: DC
  Administered 2022-03-11: 10 mg via ORAL
  Filled 2022-03-11: qty 1

## 2022-03-11 MED ORDER — KETOROLAC TROMETHAMINE 15 MG/ML IJ SOLN
15.0000 mg | Freq: Once | INTRAMUSCULAR | Status: AC
  Administered 2022-03-11: 15 mg via INTRAVENOUS
  Filled 2022-03-11: qty 1

## 2022-03-11 MED ORDER — INSULIN GLARGINE-YFGN 100 UNIT/ML ~~LOC~~ SOLN
60.0000 [IU] | Freq: Every day | SUBCUTANEOUS | Status: DC
Start: 2022-03-11 — End: 2022-03-12
  Administered 2022-03-11 – 2022-03-12 (×2): 60 [IU] via SUBCUTANEOUS
  Filled 2022-03-11 (×2): qty 0.6

## 2022-03-11 MED ORDER — INSULIN ASPART 100 UNIT/ML IJ SOLN
10.0000 [IU] | Freq: Once | INTRAMUSCULAR | Status: AC
  Administered 2022-03-11: 10 [IU] via SUBCUTANEOUS
  Filled 2022-03-11: qty 0.1

## 2022-03-11 MED ORDER — ATORVASTATIN CALCIUM 40 MG PO TABS: 40.0000 mg | ORAL_TABLET | Freq: Every day | ORAL | Status: DC

## 2022-03-11 MED ORDER — HYDROMORPHONE HCL 1 MG/ML IJ SOLN: 1.0000 mg | INTRAMUSCULAR | Status: DC | PRN

## 2022-03-11 MED ORDER — FUROSEMIDE 20 MG PO TABS
30.0000 mg | ORAL_TABLET | Freq: Every day | ORAL | Status: DC
  Administered 2022-03-11 – 2022-03-12 (×2): 30 mg via ORAL
  Filled 2022-03-11: qty 2
  Filled 2022-03-11: qty 1.5

## 2022-03-11 MED ORDER — DILTIAZEM HCL 30 MG PO TABS
30.0000 mg | ORAL_TABLET | Freq: Two times a day (BID) | ORAL | Status: DC
  Administered 2022-03-11 – 2022-03-12 (×3): 30 mg via ORAL
  Filled 2022-03-11 (×3): qty 1

## 2022-03-11 MED ORDER — LACTATED RINGERS IV SOLN
INTRAVENOUS | Status: AC
Start: 2022-03-11 — End: 2022-03-11

## 2022-03-11 MED ORDER — LACTATED RINGERS IV BOLUS
1000.0000 mL | Freq: Once | INTRAVENOUS | Status: AC
  Administered 2022-03-11: 1000 mL via INTRAVENOUS

## 2022-03-11 MED ORDER — INSULIN ASPART 100 UNIT/ML IJ SOLN
0.0000 [IU] | Freq: Three times a day (TID) | INTRAMUSCULAR | Status: DC
  Administered 2022-03-11: 15 [IU] via SUBCUTANEOUS
  Administered 2022-03-11: 7 [IU] via SUBCUTANEOUS
  Administered 2022-03-12: 4 [IU] via SUBCUTANEOUS
  Filled 2022-03-11: qty 0.2

## 2022-03-11 MED ORDER — ONDANSETRON HCL 4 MG/2ML IJ SOLN: 4.0000 mg | Freq: Four times a day (QID) | INTRAMUSCULAR | Status: DC | PRN

## 2022-03-11 MED ORDER — APIXABAN 2.5 MG PO TABS
2.5000 mg | ORAL_TABLET | Freq: Two times a day (BID) | ORAL | Status: DC
  Administered 2022-03-11 – 2022-03-12 (×3): 2.5 mg via ORAL
  Filled 2022-03-11 (×3): qty 1

## 2022-03-11 MED ORDER — ATORVASTATIN CALCIUM 40 MG PO TABS: 40.0000 mg | ORAL_TABLET | Freq: Every evening | ORAL | Status: DC

## 2022-03-11 NOTE — Telephone Encounter (Signed)
Need an update on Inspire from patient prior to scheduling inlab sleep study.  There hasn't been a download since 03/07/22.  Let pt know we need him to synch his remote so we can receive up to date info.  Pt in call myself or Estill Bamberg back with this info.

## 2022-03-11 NOTE — Progress Notes (Signed)
Patient has INSPIRE CPAP implant since March 2023. Patient states this works really well for him.

## 2022-03-11 NOTE — H&P (Signed)
History and Physical    Patient: Tony Cantu DXA:[id number] DOB: [birth year] DOA: 03/10/2022 DOS: the patient was seen and examined on 03/11/2022 PCP: [name], DO  Patient coming from: Home  Chief Complaint:  Chief Complaint  Patient presents with   Post-op Problem   HPI: Tony Cantu is a 71 y.o. male with medical history significant of aortic atherosclerosis, aortic stenosis, osteoarthritis, asthma, BPH, unspecified cirrhosis, portal hypertension, colon polyps, type 2 diabetes, diverticulosis, paroxysmal atrial fibrillation, gastric ulcers, gastric varices, gastritis, GERD, headache, heart murmur, nephrolithiasis, hypertension, hyperglycemia seizures, sleep apnea on CPAP who underwent right TKA on Monday and is coming to the emergency department due to 102 F fever at home for which he took acetaminophen.  He has not had further fevers since arrival.  No trauma home to the area. No night sweats. No sore throat, rhinorrhea, dyspnea, wheezing or hemoptysis.  No chest pain, palpitations, diaphoresis, PND, orthopnea or pitting edema of the lower extremities.  No appetite changes, abdominal pain, diarrhea, constipation, melena or hematochezia.  No flank pain, dysuria, frequency or hematuria.  No polyuria, polydipsia, polyphagia or blurred vision.  ED course: Initial vital signs were temperature 98.8 F, pulse 75, respirations 16, BP 109/66 mmHg O2 sat 93% on room air.  The patient received 2000 mL of LR bolus and oxycodone.  I added ketorolac 15 mg IVP, hydromorphone 1 mg IVP and NovoLog 10 units SQ while he was still n.p.o.  Lab work: His urinalysis was normal.  CBC is her white count 10.0, hemoglobin 14.6 g/dL platelets were 93.  Follow-up CBC with hemoglobin of 12.3 g/dL, WBCs 6.9 and platelets of 85.  BMP with a glucose of 143 mg/dL but otherwise normal.  Lactic acid 3.8, then 2.8 and 2.0 mmol/L.  Set rate was 24 mm/h.  CRP 12.3 mg/dL.  Imaging: Right knee x-rays showing anterior  soft tissue swelling with a small amount of air most likely due to recent surgery.  Right knee arthroplasty appears uncomplicated.  Chest radiograph showed cardiomegaly with central pulmonary vascular congestion.   Review of Systems: As mentioned in the history of present illness. All other systems reviewed and are negative.  Past Medical History:  Diagnosis Date   Aortic atherosclerosis ([place])    Aortic stenosis    Arthritis    Asthma    BPH (benign prostatic hypertrophy)    Cirrhosis ([place]) 2016   Colon polyps    Diabetes mellitus without complication ([place])    TYPE 1    Diverticulosis    Dysrhythmia 04/19/2021   A-fib noted with possible A-fib RVR   Gastric ulcer    Gastric varices    right   Gastritis    GERD (gastroesophageal reflux disease)    Headache    History- pt states these were migraines that occurred in the 1980's   Heart murmur    History of kidney stones    Hx of adenomatous colonic polyps    Hypertension    PONV (postoperative nausea and vomiting)    pt states he has never had post op nausea or vomiting   Portal hypertensive gastropathy (HCC)    Renal cyst, right    Seizures (HCC)    HYPOGLYCEMIC LAST 1 AND 1/2 YRS AGO   Sleep apnea    uses CPAP nightly   Testicle trouble    one testicle BORN WITH   Tubular adenoma of colon    Past Surgical History:  Procedure Laterality Date   BACK SURGERY  [place] Right 10/13/2015   Procedure: RIGHT CARPAL TUNNEL RELEASE;  Surgeon: Daryll Brod, MD;  Location: [facility];  Service: Orthopedics;  Laterality: Right;   CARPAL TUNNEL RELEASE Left 07/05/2016   Procedure: LEFT CARPAL TUNNEL RELEASE;  Surgeon: Daryll Brod, MD;  Location: [facility];  Service: Orthopedics;  Laterality: Left;   DRUG INDUCED ENDOSCOPY N/A 07/02/2021   Procedure: DRUG INDUCED SLEEP ENDOSCOPY;  Surgeon: Melida Quitter, MD;  Location: [facility];  Service: ENT;  Laterality: N/A;   IMPLANTATION OF  HYPOGLOSSAL NERVE STIMULATOR Right 10/01/2021   Procedure: IMPLANTATION OF HYPOGLOSSAL NERVE STIMULATOR;  Surgeon: Melida Quitter, MD;  Location: [facility];  Service: ENT;  Laterality: Right;   INGUINAL HERNIA REPAIR  2003   right    KNEE ARTHROSCOPY Right 03/03/2016   LUMBAR DISC SURGERY  03/1995   Dr. Coralyn Mark, discectomy   LUMBAR LAMINECTOMY/DECOMPRESSION MICRODISCECTOMY Right 10/06/2016   Procedure: RIGHT LUMBAR THREE - LUMBAR FOUR  LAMINECTOMY, FORAMINOTOMY AND MICRODISCECTOMY;  Surgeon: Jovita Gamma, MD;  Location: [facility];  Service: Neurosurgery;  Laterality: Right;   SHOULDER SURGERY  11/28/2005   left partial   SHOULDER SURGERY  07/14/2006   RIGHT   TONSILLECTOMY  AGE 89 OR 4   TOTAL KNEE ARTHROPLASTY Right 03/07/2022   Procedure: RIGHT TOTAL KNEE ARTHROPLASTY;  Surgeon: Frederik Pear, MD;  Location: [facility];  Service: Orthopedics;  Laterality: Right;   TOTAL SHOULDER ARTHROPLASTY Right 11/01/2018   Procedure: RIGHT SHOULDER REVISION TO REVERSE TOTAL SHOULDER;  Surgeon: Tania Ade, MD;  Location: [facility];  Service: Orthopedics;  Laterality: Right;  CHOICE ANESTHESIA WITH INTERSCALENE BLOCK EXPAREL, NEEDS RNFA   Social History:  reports that he quit smoking about 18 years ago. His smoking use included cigarettes and pipe. He has never used smokeless tobacco. He reports that he does not currently use alcohol. He reports that he does not use drugs.  Allergies  Allergen Reactions   Dimetapp Children's Cold-Cough     Does not remember the reaction   Erythromycin Diarrhea   Sulfonamide Derivatives Diarrhea    Family History  Problem Relation Age of Onset   Heart failure Mother    Heart attack Mother        in her 96's   Alzheimer's disease Mother    Diabetes Mother    Asthma Father    Suicidality Father        in his 78's   Allergic rhinitis Sister    Heart failure Maternal Grandmother    Rheum arthritis Maternal Grandmother    Breast cancer Maternal Grandmother    Heart  attack Maternal Grandmother        in her 17's   Colon cancer Neg Hx    Esophageal cancer Neg Hx    Pancreatic cancer Neg Hx    Liver disease Neg Hx     Prior to Admission medications   Medication Sig Start Date End Date Taking? Authorizing Provider  atorvastatin (LIPITOR) 40 MG tablet TAKE 1 TABLET BY MOUTH DAILY Patient taking differently: Take 40 mg by mouth daily. 12/23/21  Yes Ronnie Doss M, DO  b complex vitamins tablet Take 1 tablet by mouth daily.   Yes [provider]  Cholecalciferol (VITAMIN D3) 2000 UNITS capsule Take 2,000 Units by mouth daily.   Yes [provider]  Coenzyme Q10 (COQ10) 200 MG CAPS Take 200 mg by mouth daily.   Yes [provider]  diltiazem (CARDIZEM)  30 MG tablet TAKE 1 TABLET BY MOUTH  TWICE DAILY Patient taking differently: Take 30 mg by mouth 2 (two) times daily. 03/02/22  Yes Minus Breeding, MD  ELIQUIS 2.5 MG TABS tablet Take 2.5 mg by mouth 2 (two) times daily. 02/16/22  Yes [provider]  furosemide (LASIX) 20 MG tablet TAKE 1 AND 1/2 TABLETS BY MOUTH  DAILY Patient taking differently: Take 30 mg by mouth daily. 12/23/21  Yes Gottschalk, Ashly M, DO  Glucagon, rDNA, (GLUCAGON EMERGENCY) 1 MG KIT INJECT AS DIRECTED INTO  UPPER ARM, THIGH OR  BUTTOCKS AS NEEDED FOR  SEVERE HYPOGLYCEMIA. SEEK  MEDICAL ATTENTION AFTER USE Patient taking differently: Inject 1 mg into the muscle as needed (HYPOGLYCEMIA). 06/14/21  Yes Hendricks Limes F, FNP  Glucosamine-Chondroit-Vit C-Mn (GLUCOSAMINE 1500 COMPLEX) CAPS Take 2 capsules by mouth daily.   Yes [provider]  insulin lispro (HUMALOG) 100 UNIT/ML injection INJECT SUBCUTANEOUSLY 20 TO 50  UNITS 3 TIMES DAILY BEFORE MEALS PER SLIDING SCALE 01/21/22  Yes Gottschalk, Ashly M, DO  MAGNESIUM OXIDE PO Take 500 mg by mouth daily.   Yes [provider]  montelukast (SINGULAIR) 10 MG tablet TAKE 1 TABLET BY MOUTH AT  BEDTIME Patient taking differently: Take 10 mg  by mouth at bedtime. 09/30/21  Yes Gottschalk, Leatrice Jewels M, DO  multivitamin-lutein (OCUVITE-LUTEIN) CAPS capsule Take 1 capsule by mouth daily.   Yes [provider]  oxyCODONE-acetaminophen (PERCOCET/ROXICET) 5-325 MG tablet Take 1 tablet by mouth every 4 (four) hours as needed for pain. 02/16/22  Yes [provider]  pantoprazole (PROTONIX) 40 MG tablet Take 1 tablet (40 mg total) by mouth 2 (two) times daily before a meal. 07/07/21  Yes Pyrtle, Lajuan Lines, MD  tiZANidine (ZANAFLEX) 2 MG tablet Take 2 mg by mouth every 6 (six) hours as needed for muscle spasms. 02/16/22  Yes [provider]  TOUJEO MAX SOLOSTAR 300 UNIT/ML Solostar Pen INJECT SUBCUTANEOUSLY 110 UNITS  DAILY Patient taking differently: Inject 60 Units into the skin daily. 01/20/22  Yes Gottschalk, Leatrice Jewels M, DO  Turmeric Curcumin 500 MG CAPS Take 500 mg by mouth daily.   Yes [provider]  albuterol (VENTOLIN HFA) 108 (90 Base) MCG/ACT inhaler Inhale 2 puffs into the lungs every 6 (six) hours as needed for wheezing or shortness of breath. Patient not taking: Reported on 03/10/2022 02/18/20   [name], DO  benzonatate (TESSALON) 100 MG capsule Take 1 capsule (100 mg total) by mouth 3 (three) times daily as needed for cough. Patient not taking: Reported on 02/24/2022 01/12/22   Brunetta Jeans, PA-C  Blood Glucose Monitoring Suppl (CONTOUR NEXT ONE) KIT Test BS QID and as needed Dx E11.9 06/04/20   [name], DO  Continuous Blood Gluc Receiver (FREESTYLE LIBRE READER) DEVI 1 applicator by Does not apply route as directed. 08/10/17   Chipper Herb, MD  Continuous Blood Gluc Sensor (FREESTYLE LIBRE SENSOR SYSTEM) MISC Check BS eight (8) times a day. Dx E10.9 06/02/21   Ronnie Doss M, DO  ELIQUIS 5 MG TABS tablet TAKE 1 TABLET BY MOUTH  TWICE DAILY Patient not taking: Reported on 03/10/2022 01/13/22   Deberah Pelton, NP  glucose blood (CONTOUR NEXT TEST) test strip Test BS QID and as needed  Dx E11.9 11/08/21   [name], DO  Microlet Lancets MISC Test BS QID and as needed Dx E11.9 06/04/20   Ronnie Doss M, DO  ondansetron (ZOFRAN) 4 MG tablet Take 4  mg by mouth every 6 (six) hours as needed for vomiting or nausea. 02/16/22   [provider]  Testosterone 30 MG/ACT SOLN APPLY ONE PUMP TOPICALLY  UNDER EACH AXILLA DAILY  (TOTAL OF 60MG DAILY ) Patient not taking: Reported on 03/10/2022 09/18/20   [name], DO  valsartan (DIOVAN) 40 MG tablet Take 0.5 tablets (20 mg total) by mouth daily. Patient not taking: Reported on 02/24/2022 01/31/22   [name], DO    Physical Exam: Vitals:   03/11/22 0300 03/11/22 0400 03/11/22 0500 03/11/22 0600  BP: 98/69 108/62 111/62 115/62  Pulse: 85 81 83 88  Resp: _0 Temp:      TempSrc:      SpO2: 97% 93% 96% 97%   Physical Exam Vitals and nursing note reviewed.  Constitutional:      General: He is awake.     Appearance: Normal appearance. He is obese. He is not ill-appearing.  HENT:     Head: Normocephalic.     Mouth/Throat:     Mouth: Mucous membranes are moist.  Eyes:     General: No scleral icterus.    Pupils: Pupils are equal, round, and reactive to light.  Cardiovascular:     Rate and Rhythm: Normal rate and regular rhythm.  Abdominal:     General: Bowel sounds are normal. There is no distension.     Palpations: Abdomen is soft.     Tenderness: There is no abdominal tenderness. There is no right CVA tenderness, left CVA tenderness or guarding.  Musculoskeletal:     Cervical back: Neck supple.     Right lower leg: Edema present.     Left lower leg: No edema.  Skin:    General: Skin is warm and dry.  Neurological:     General: No focal deficit present.     Mental Status: He is alert and oriented to person, place, and time.  Psychiatric:        Mood and Affect: Mood normal.        Behavior: Behavior normal. Behavior is cooperative.      Data Reviewed:  Results are  pending, will review when available.  09/22/2021 echocardiogram IMPRESSIONS   1. Left ventricular ejection fraction, by estimation, is 55 to 60%. The  left ventricle has normal function. The left ventricle demonstrates  regional Capri motion abnormalities (see scoring diagram/findings for  description). Left ventricular diastolic  parameters are consistent with Grade I diastolic dysfunction (impaired  relaxation).   2. Right ventricular systolic function is normal. The right ventricular  size is normal. Tricuspid regurgitation signal is inadequate for assessing  PA pressure.   3. Left atrial size was mildly dilated.   4. The mitral valve is degenerative. Mild mitral valve regurgitation.   5. The aortic valve is tricuspid. There is moderate calcification of the  aortic valve. Aortic valve regurgitation is mild. Moderate aortic valve  stenosis. Aortic valve area, by VTI measures 1.28 cm. Aortic valve mean  gradient measures 23.5 mmHg.  Dimentionless index 0.41.   6. The inferior vena cava is dilated in size with >50% respiratory  variability, suggesting right atrial pressure of 8 mmHg.    Assessment and Plan: Principal Problem:   S/P TKR (total knee replacement), right Coming to the emergency department now with:   Postoperative fever Observation/MedSurg. Continue analgesics as needed. Orthopedic surgery input appreciated. They suggested no antibiotic therapy for now. They will rule out for R lower extremity DVT.  Active Problems:   OSA (obstructive sleep apnea) Continue CPAP at bedtime.    Diabetes type 2, controlled ([place]) Currently having hyperglycemia. Last hemoglobin A1c 4.4%. Carbohydrate modified diet. Continue insulin glargine 60 units daily. CBG monitoring with RI SS.    Thrombocytopenia (HCC) Monitor platelet count.    Hyperbilirubinemia History of liver cirrhosis. Monitor LFTs closely. Consider pravastatin instead of atorvastatin.    Aortic  atherosclerosis (HCC)   Hyperlipidemia associated with type 2 diabetes mellitus ([place]) On atorvastatin.  Consider switching to pravastatin.    PAF (paroxysmal atrial fibrillation) (HCC)   Paroxysmal tachycardia (HCC) CHA2DS2-VASc Scor of at least 5. Continue apixaban 2.5 mg p.o. twice daily Increase back to 5 mg twice daily per orthopedic surgery Continue Cardizem 30 mg p.o. twice daily for rate control. Continue magnesium oxide 400 mg p.o. daily.    Grade I diastolic dysfunction No signs of decompensation. Would benefit from ACE inhibitor therapy. Would benefit from beta-blocker therapy. Follow-up with PCP/cardiology as an outpatient.       Advance Care Planning:   Code Status: Full Code   Consults: [facility]  Family Communication: His wife was at bedside.  Severity of Illness: The appropriate patient status for this patient is OBSERVATION. Observation status is judged to be reasonable and necessary in order to provide the required intensity of service to ensure the patient's safety. The patient's presenting symptoms, physical exam findings, and initial radiographic and laboratory data in the context of their medical condition is felt to place them at decreased risk for further clinical deterioration. Furthermore, it is anticipated that the patient will be medically stable for discharge from the hospital within 2 midnights of admission.   Author: Reubin Milan, MD 03/11/2022 7:43 AM  For on call review [email or web address].   This document was prepared using Dragon voice recognition software and may contain some unintended transcription errors.

## 2022-03-11 NOTE — Consult Note (Cosign Needed Addendum)
PATIENT ID: DEMERIUS Cantu  MRN: [id number]  DOB/AGE:  April 29, 1951 / 71 y.o.        PROGRESS NOTE Subjective: Tony Cantu reported to the ER last night with complaints of severe right knee pain and elevated temperature.  He is status post right total knee arthroplasty with Dr. Mayer Camel on 03/07/22 and is currently on Eliquis as his blood thinner.  This is half of his normal dose at 2.5 mg b.I.d.  He is not on antibiotics.  He states that his temperature was up to 102 at home.  He took Tylenol and when he got to the emergency room his temperature was back to normal.  Due to concerns of infection and the severe right knee pain he was held overnight and labs were ordered including CBC, sed rate, CRP, blood culture 2, chest x-ray, and urinalysis.he denies any injuries but states he cannot bear weight on the right leg.  Objective: Vital signs in last 24 hours: Vitals:   03/11/22 0900 03/11/22 0902 03/11/22 0948 03/11/22 1100  BP: (!) 100/50 (!) 100/50 122/62 108/61  Pulse: 87 87  79  Resp:  18  17  Temp:      TempSrc:      SpO2: 93% 93%  93%      Intake/Output from previous day: No intake/output data recorded.   Intake/Output this shift: No intake/output data recorded.   LABORATORY DATA: Recent Labs    03/10/22 2110 03/11/22 0735 03/11/22 0844 03/11/22 1145  WBC 10.0 6.9  --   --   HGB 14.6 12.3*  --   --   HCT 43.9 35.7*  --   --   PLT 93* 85*  --   --   NA 139 136  --   --   K 4.2 4.4  --   --   CL 104 105  --   --   CO2 26 23  --   --   BUN 13 14  --   --   CREATININE 0.99 0.87  --   --   GLUCOSE 143* 348*  --   --   GLUCAP  --   --  373* 351*  CALCIUM 9.1 8.4*  --   --     Examination: Patient is a pleasant 71 year old male in no acute distress.  He is afebrile.  Patient's knee is examined and reveals the incision to have discoloration anteriorly with swelling and bruising mild erythema and mild warmth.  There is a pitcher in the chart of the incision.  He has mild discomfort  with palpation of the right calf.  Minimal pain with plantar flexion dorsiflexion of the right ankle.  Patient has severe pain with attempts of active motion of the right knee but passively I am able to flex his knee to approximately 30.  I did not want to push any further.  He does have some mild discomfort in the right thigh.  No discharge or drainage from the incision.  Assessment:     Status post right total knee arthroplasty on 03/07/22 with pain and erythema cannot rule out infection  ADDITIONAL DIAGNOSIS: Expected Acute Blood Loss Anemia, Afib, OSA, Obesity, LE edema, diabetes   Plan: We will continue to monitor patient's right knee.  His blood cultures are pending.  As of now chest x-ray, urinalysis and white count are normal.  If the blood cultures come back negative then likely patients pain is due to the swelling and irritation caused by increased  blood under his skin which can be highly irritating.  We appreciate hospitalist input with continued care of [name].  Patient did request home physical therapy once he is discharged as he does not feel that he will be able to attend outpatient therapy and I do believe this is a good idea.     Joanell Rising 03/11/2022, 12:25 PM

## 2022-03-11 NOTE — Telephone Encounter (Signed)
Pt is currently hospitalized at [facility] - was admitted 03/10/22 post knee surgery w/ knee pain; redness; swelling.  We will monitor & will update once pt has been released.

## 2022-03-11 NOTE — Plan of Care (Signed)
Problem: Education: Goal: Ability to describe self-care measures that may prevent or decrease complications (Diabetes Survival Skills Education) will improve Outcome: Progressing   Problem: Clinical Measurements: Goal: Ability to maintain clinical measurements within normal limits will improve Outcome: Progressing   Problem: Pain Managment: Goal: General experience of comfort will improve Outcome: [place], RN 03/11/22 8:33 PM

## 2022-03-11 NOTE — Progress Notes (Signed)
Right lower extremity venous duplex completed. Refer to "CV Proc" under chart review to view preliminary results.  03/11/2022 2:25 PM Kelby Aline., MHA, RVT, RDCS, RDMS

## 2022-03-12 ENCOUNTER — Encounter (HOSPITAL_COMMUNITY): Payer: Self-pay | Admitting: Internal Medicine

## 2022-03-12 DIAGNOSIS — R5082 Postprocedural fever: Secondary | ICD-10-CM | POA: Diagnosis not present

## 2022-03-12 LAB — COMPREHENSIVE METABOLIC PANEL
ALT: 33 U/L (ref 0–44)
AST: 37 U/L (ref 15–41)
Albumin: 2.8 g/dL — ABNORMAL LOW (ref 3.5–5.0)
Alkaline Phosphatase: 67 U/L (ref 38–126)
Anion gap: 8 (ref 5–15)
BUN: 16 mg/dL (ref 8–23)
CO2: 29 mmol/L (ref 22–32)
Calcium: 8.8 mg/dL — ABNORMAL LOW (ref 8.9–10.3)
Chloride: 105 mmol/L (ref 98–111)
Creatinine, Ser: 0.88 mg/dL (ref 0.61–1.24)
GFR, Estimated: >60 mL/min (ref >60)
Glucose, Bld: 66 mg/dL — ABNORMAL LOW (ref 70–99)
Potassium: 3.7 mmol/L (ref 3.5–5.1)
Sodium: 142 mmol/L (ref 135–145)
Total Bilirubin: 2.1 mg/dL — ABNORMAL HIGH (ref 0.3–1.2)
Total Protein: 6.2 g/dL — ABNORMAL LOW (ref 6.5–8.1)

## 2022-03-12 LAB — CBC
HCT: 40 % (ref 39.0–52.0)
Hemoglobin: 13.6 g/dL (ref 13.0–17.0)
MCH: 36.2 pg — ABNORMAL HIGH (ref 26.0–34.0)
MCHC: 34 g/dL (ref 30.0–36.0)
MCV: 106.4 fL — ABNORMAL HIGH (ref 80.0–100.0)
Platelets: 112 10*3/uL — ABNORMAL LOW (ref 150–400)
RBC: 3.76 MIL/uL — ABNORMAL LOW (ref 4.22–5.81)
RDW: 14.3 % (ref 11.5–15.5)
WBC: 6.9 10*3/uL (ref 4.0–10.5)
nRBC: 0 % (ref 0.0–0.2)

## 2022-03-12 LAB — GLUCOSE, CAPILLARY
Glucose-Capillary: 110 mg/dL — ABNORMAL HIGH (ref 70–99)
Glucose-Capillary: 161 mg/dL — ABNORMAL HIGH (ref 70–99)
Glucose-Capillary: 75 mg/dL (ref 70–99)
Glucose-Capillary: 98 mg/dL (ref 70–99)

## 2022-03-12 MED ORDER — TOUJEO MAX SOLOSTAR 300 UNIT/ML ~~LOC~~ SOPN: 60.0000 [IU] | PEN_INJECTOR | Freq: Every day | SUBCUTANEOUS | Status: DC

## 2022-03-12 NOTE — Progress Notes (Signed)
PATIENT ID: Tony Cantu  MRN: [id number]  DOB/AGE:  Feb 25, 1951 / 71 y.o.        PROGRESS NOTE Subjective: Patient is alert, oriented, no Nausea, no Vomiting, yes passing gas. Taking PO well. Denies SOB, Chest or Calf Pain. Using Incentive Spirometer, PAS in place. Ambulate WBAT, Patient reports pain as 3/10 .    Objective: Vital signs in last 24 hours: Vitals:   03/11/22 1845 03/11/22 1857 03/11/22 2215 03/12/22 0618  BP:  111/62 124/71 126/66  Pulse:  72 74 65  Resp:  20 14 18   Temp:  98.6 F (37 C) 98.2 F (36.8 C) 98.6 F (37 C)  TempSrc:  Axillary Oral Oral  SpO2:  96% 97% 96%  Weight: 101.6 kg     Height: 5' 8"  (1.727 m)         Intake/Output from previous day: I/O last 3 completed shifts: In: 2571.7 [P.O.:600; I.V.:971.7; IV Piggyback:1000] Out: 1300 [Urine:1300]   Intake/Output this shift: No intake/output data recorded.   LABORATORY DATA: Recent Labs    03/11/22 0735 03/11/22 0844 03/11/22 2216 03/12/22 0021 03/12/22 0323 03/12/22 0619  WBC 6.9  --   --   --  6.9  --   HGB 12.3*  --   --   --  13.6  --   HCT 35.7*  --   --   --  40.0  --   PLT 85*  --   --   --  112*  --   NA 136  --   --   --  142  --   K 4.4  --   --   --  3.7  --   CL 105  --   --   --  105  --   CO2 23  --   --   --  29  --   BUN 14  --   --   --  16  --   CREATININE 0.87  --   --   --  0.88  --   GLUCOSE 348*  --   --   --  66*  --   GLUCAP  --    < > 86 75  --  98  CALCIUM 8.4*  --   --   --  8.8*  --    < > = values in this interval not displayed.    Examination: Neurologically intact ABD soft Neurovascular intact Sensation intact distally Intact pulses distally Dorsiflexion/Plantar flexion intact Incision: dressing C/D/I No cellulitis present Compartment soft}  Assessment:   Post-op fever and elevated glucose resolved, blood cultures negative so far 2.ADDITIONAL DIAGNOSIS: Expected Acute Blood Loss Anemia, Afib, OSA, IDDM   Plan: PT/OT WBAT, AROM and PROM,  may leave dressing off DVT Prophylaxis:  eliquis 2.18m bid x 2 wks post-op then return to 58mPO BID DISCHARGE PLAN: Home DISCHARGE NEEDS: HHPT and Tony Cantu/15/2023, 7:31 AM  Patient ID: Tony Cantu   DOB: [id number].   MRN: [id number]

## 2022-03-12 NOTE — Evaluation (Signed)
Physical Therapy Evaluation Patient Details Name: Tony Cantu MRN: [id number] DOB: [birth year] Today's Date: 03/12/2022  History of Present Illness  Pt is 71 year old male s/p R TKA on 03/07/22 with history of bil shoulder surgeries and lumbar spine surgery, returned to ED with fever on  03/10/22.  Clinical Impression  The  patient reports Right knee pain, edema of the  right leg noted. Patient very antalgic on RLE and limited ambulation. Patient wife present and reports son will assist getting patient into home at DC. MD has ordered HHPT.  Continue PT while in acute care to improve ROM and function of RLE.        Recommendations for follow up therapy are one component of a multi-disciplinary discharge planning process, led by the attending physician.  Recommendations may be updated based on patient status, additional functional criteria and insurance authorization.  Follow Up Recommendations Home health PT (per MD)      Assistance Recommended at Discharge Frequent or constant Supervision/Assistance  Patient can return home with the following  Help with stairs or ramp for entrance;Assistance with cooking/housework;A little help with walking and/or transfers    Equipment Recommendations None recommended by PT  Recommendations for Other Services       Functional Status Assessment Patient has had a recent decline in their functional status and demonstrates the ability to make significant improvements in function in a reasonable and predictable amount of time.     Precautions / Restrictions Precautions Precautions: Fall;Knee      Mobility  Bed Mobility   Bed Mobility: Supine to Sit     Supine to sit: Min assist     General bed mobility comments: support the right leg    Transfers Overall transfer level: Needs assistance Equipment used: Rolling walker (2 wheels) Transfers: Sit to/from Stand Sit to Stand: Min assist           General transfer comment: close guard from  bed, reporting increased right knee pain    Ambulation/Gait Ambulation/Gait assistance: Min assist Gait Distance (Feet): 8 Feet Assistive device: Rolling walker (2 wheels) Gait Pattern/deviations: Step-to pattern, Antalgic, Decreased step length - right, Decreased stance time - right Gait velocity: decr     General Gait Details: patient  was limited with WB on the right leg and limited distance  Stairs            Wheelchair Mobility    Modified Rankin (Stroke Patients Only)       Balance Overall balance assessment: Needs assistance Sitting-balance support: Feet supported, No upper extremity supported Sitting balance-Leahy Scale: Good     Standing balance support: During functional activity, Bilateral upper extremity supported Standing balance-Leahy Scale: Poor Standing balance comment: limited WB on RLE                             Pertinent Vitals/Pain Pain Assessment Pain Score: 6  Pain Location: right knee Pain Descriptors / Indicators: Sore, Aching Pain Intervention(s): Monitored during session, Premedicated before session    Home Living Family/patient expects to be discharged to:: Private residence Living Arrangements: Spouse/significant other Available Help at Discharge: Family;Available 24 hours/day Type of Home: House Home Access: Stairs to enter Entrance Stairs-Rails: None Entrance Stairs-Number of Steps: 1+1   Home Layout: Able to live on main level with bedroom/bathroom Home Equipment: Conservation officer, nature (2 wheels);Standard Walker      Prior Function Prior Level of Function : Needs assist  Physical Assist : Mobility (physical)     Mobility Comments: ambulating with Rw but limited since Dc due to knee pain       Hand Dominance        Extremity/Trunk Assessment   Upper Extremity Assessment Upper Extremity Assessment: Overall WFL for tasks assessed    Lower Extremity Assessment Lower Extremity Assessment: RLE  deficits/detail RLE Deficits / Details: knee  flexion tolerated to ~ 50 seated in recliner, noted edema  from thigh to foot, redness over the  knee incision    Cervical / Trunk Assessment Cervical / Trunk Assessment: Normal  Communication   Communication: No difficulties  Cognition Arousal/Alertness: Awake/alert Behavior During Therapy: WFL for tasks assessed/performed Overall Cognitive Status: Within Functional Limits for tasks assessed                                          General Comments      Exercises     Assessment/Plan    PT Assessment Patient needs continued PT services  PT Problem List Decreased strength;Decreased range of motion;Decreased mobility;Decreased knowledge of use of DME;Pain       PT Treatment Interventions Gait training;DME instruction;Therapeutic exercise;Balance training;Stair training;Functional mobility training;Patient/family education;Therapeutic activities    PT Goals (Current goals can be found in the Care Plan section)  Acute Rehab PT Goals Patient Stated Goal: to go home PT Goal Formulation: With patient/family Time For Goal Achievement: 03/19/22 Potential to Achieve Goals: Good    Frequency Min 5X/week     Co-evaluation               AM-PAC PT "6 Clicks" Mobility  Outcome Measure Help needed turning from your back to your side while in a flat bed without using bedrails?: A Little Help needed moving from lying on your back to sitting on the side of a flat bed without using bedrails?: A Little Help needed moving to and from a bed to a chair (including a wheelchair)?: A Little Help needed standing up from a chair using your arms (e.g., wheelchair or bedside chair)?: A Little Help needed to walk in hospital room?: A Lot Help needed climbing 3-5 steps with a railing? : A Lot 6 Click Score: 16    End of Session Equipment Utilized During Treatment: Gait belt Activity Tolerance: Patient limited by pain Patient  left: in chair;with call bell/phone within reach;with family/visitor present Nurse Communication: Mobility status PT Visit Diagnosis: Other abnormalities of gait and mobility (R26.89)    Time: 1610-9604 PT Time Calculation (min) (ACUTE ONLY): 14 min   Charges:   PT Evaluation $PT Eval Low Complexity: 1 Low          [place] Office [phone] Weekend pager-[phone]   Claretha Cooper 03/12/2022, 12:44 PM

## 2022-03-12 NOTE — Plan of Care (Signed)
Pt will be ready for DC home as soon as HHPT is set up.

## 2022-03-12 NOTE — Discharge Summary (Signed)
Physician Discharge Summary   Patient: Tony Cantu MRN: [id number] DOB: 1951-06-10  Admit date:     03/10/2022  Discharge date: 03/12/22  Discharge Physician: Jennye Boroughs   PCP: [name], DO   Recommendations at discharge:     Discharge Diagnoses: Principal Problem:   Postoperative fever Active Problems:   OSA (obstructive sleep apnea)   Diabetes type 2, controlled (HCC)   Thrombocytopenia (HCC)   Hyperbilirubinemia   Aortic atherosclerosis (HCC)   Paroxysmal tachycardia ([facility])   Hyperlipidemia associated with type 2 diabetes mellitus (HCC)   PAF (paroxysmal atrial fibrillation) (HCC)   S/P TKR (total knee replacement), right   Grade I diastolic dysfunction  Resolved Problems:   * No resolved hospital problems. [facility] Course:  Mr. Tony Cantu is a 71 y.o. male with medical history significant of aortic atherosclerosis, aortic stenosis, osteoarthritis, asthma, BPH, unspecified cirrhosis, portal hypertension, colon polyps, type 2 diabetes, diverticulosis, paroxysmal atrial fibrillation, gastric ulcers, gastric varices, gastritis, GERD, headache, heart murmur, nephrolithiasis, hypertension, hyperglycemia seizures, sleep apnea on CPAP who underwent right TKA on Monday and is coming to the emergency department due to 102 F fever at home for which he took acetaminophen.  He will have any fever while on admission.  He was admitted to the hospital for postoperative fever.  This was attributed to recent right total knee arthroplasty.  He was evaluated by the orthopedic surgeon.  No growth on blood cultures thus far. Knee infection was ruled out.  From orthopedic standpoint, patient can be discharged.  He is deemed stable for discharge to home today.        Consultants: Orthopedic surgeon Procedures performed: None Disposition: Home health Diet recommendation:  Discharge Diet Orders (From admission, onward)     Start     Ordered   03/12/22 0000  Diet - low  sodium heart healthy        03/12/22 0935           Cardiac and Carb modified diet DISCHARGE MEDICATION: Allergies as of 03/12/2022       Reactions   Dimetapp Children's Cold-cough    Does not remember the reaction   Erythromycin Diarrhea   Sulfonamide Derivatives Diarrhea        Medication List     STOP taking these medications    albuterol 108 (90 Base) MCG/ACT inhaler Commonly known as: VENTOLIN HFA   benzonatate 100 MG capsule Commonly known as: TESSALON   ondansetron 4 MG tablet Commonly known as: ZOFRAN   Testosterone 30 MG/ACT Soln   valsartan 40 MG tablet Commonly known as: DIOVAN       TAKE these medications    atorvastatin 40 MG tablet Commonly known as: LIPITOR TAKE 1 TABLET BY MOUTH DAILY   b complex vitamins tablet Take 1 tablet by mouth daily.   Contour Next One Kit Test BS QID and as needed Dx E11.9   Contour Next Test test strip Generic drug: glucose blood Test BS QID and as needed Dx E11.9   CoQ10 200 MG Caps Take 200 mg by mouth daily.   diltiazem 30 MG tablet Commonly known as: CARDIZEM TAKE 1 TABLET BY MOUTH  TWICE DAILY   Eliquis 2.5 MG Tabs tablet Generic drug: apixaban Take 2.5 mg by mouth 2 (two) times daily. What changed: Another medication with the same name was removed. Continue taking this medication, and follow the directions you see here.   FreeStyle Libre Reader Devi 1 applicator by Does  not apply route as directed.   FreeStyle [employer] System Misc Check BS eight (8) times a day. Dx E10.9   furosemide 20 MG tablet Commonly known as: LASIX TAKE 1 AND 1/2 TABLETS BY MOUTH  DAILY   Glucagon Emergency 1 MG Kit INJECT AS DIRECTED INTO  UPPER ARM, THIGH OR  BUTTOCKS AS NEEDED FOR  SEVERE HYPOGLYCEMIA. SEEK  MEDICAL ATTENTION AFTER USE What changed: See the new instructions.   Glucosamine 1500 Complex Caps Take 2 capsules by mouth daily.   insulin lispro 100 UNIT/ML injection Commonly known as:  HumaLOG INJECT SUBCUTANEOUSLY 20 TO 50  UNITS 3 TIMES DAILY BEFORE MEALS PER SLIDING SCALE   MAGNESIUM OXIDE PO Take 500 mg by mouth daily.   Microlet Lancets Misc Test BS QID and as needed Dx E11.9   montelukast 10 MG tablet Commonly known as: SINGULAIR TAKE 1 TABLET BY MOUTH AT  BEDTIME   multivitamin-lutein Caps capsule Take 1 capsule by mouth daily.   oxyCODONE-acetaminophen 5-325 MG tablet Commonly known as: PERCOCET/ROXICET Take 1 tablet by mouth every 4 (four) hours as needed for pain.   pantoprazole 40 MG tablet Commonly known as: PROTONIX Take 1 tablet (40 mg total) by mouth 2 (two) times daily before a meal.   tiZANidine 2 MG tablet Commonly known as: ZANAFLEX Take 2 mg by mouth every 6 (six) hours as needed for muscle spasms.   Toujeo Max SoloStar 300 UNIT/ML Solostar Pen Generic drug: insulin glargine (2 Unit Dial) Inject 60 Units into the skin daily.   Turmeric Curcumin 500 MG Caps Take 500 mg by mouth daily.   Vitamin D3 50 MCG (2000 UT) capsule Take 2,000 Units by mouth daily.               Discharge Care Instructions  (From admission, onward)           Start     Ordered   03/12/22 0000  Discharge wound care:       Comments: Follow up with orthopedic surgeon as scheduled   03/12/22 0935            Discharge Exam: Filed Weights   03/11/22 1845  Weight: 101.6 kg   GEN: NAD SKIN: Warm and dry EYES: No pallor or icterus ENT: MMM CV: RRR PULM: CTA B ABD: soft, obese, NT, +BS CNS: AAO x 3, non focal EXT: Right knee surgical incisional wound looks clean, dry and intact.  Mild erythema and tenderness about the right knee.   Condition at discharge: good  The results of significant diagnostics from this hospitalization (including imaging, microbiology, ancillary and laboratory) are listed below for reference.   Imaging Studies: VAS [place] LOWER EXTREMITY VENOUS (DVT)  Result Date: 03/12/2022  Lower Venous DVT Study Patient Name:   Tony Cantu  Date of Exam:   03/11/2022 Medical Rec #: [id number]       Accession #:    [id number] Date of Birth: 28-Oct-1950        Patient Gender: M Patient Age:   71 years Exam Location:  [facility] Procedure:      VAS [place] LOWER EXTREMITY VENOUS (DVT) Referring Phys: ERIC PHILLIPS --------------------------------------------------------------------------------  Indications: Pain, Edema, and Erythema. Other Indications: S/p right knee surgery. Limitations: Poor ultrasound/tissue interface. Comparison Study: No prior study Performing Technologist: Maudry Mayhew MHA, RDMS, RVT, RDCS  Examination Guidelines: A complete evaluation includes B-mode imaging, spectral Doppler, color Doppler, and power Doppler as needed of all accessible portions of each vessel.  Bilateral testing is considered an integral part of a complete examination. Limited examinations for reoccurring indications may be performed as noted. The reflux portion of the exam is performed with the patient in reverse Trendelenburg.  +---------+---------------+---------+-----------+----------+--------------+ RIGHT    CompressibilityPhasicitySpontaneityPropertiesThrombus Aging +---------+---------------+---------+-----------+----------+--------------+ CFV      Full           Yes      Yes                                 +---------+---------------+---------+-----------+----------+--------------+ SFJ      Full                                                        +---------+---------------+---------+-----------+----------+--------------+ FV Prox  Full                                                        +---------+---------------+---------+-----------+----------+--------------+ FV Mid   Full                                                        +---------+---------------+---------+-----------+----------+--------------+ FV DistalFull                                                         +---------+---------------+---------+-----------+----------+--------------+ PFV      Full                                                        +---------+---------------+---------+-----------+----------+--------------+ POP      Full           Yes      Yes                                 +---------+---------------+---------+-----------+----------+--------------+ PTV      Full                                                        +---------+---------------+---------+-----------+----------+--------------+ PERO     Full                                                        +---------+---------------+---------+-----------+----------+--------------+   +----+---------------+---------+-----------+----------+--------------+ LEFTCompressibilityPhasicitySpontaneityPropertiesThrombus Aging +----+---------------+---------+-----------+----------+--------------+ CFV Full  Yes      Yes                                 +----+---------------+---------+-----------+----------+--------------+    Summary: RIGHT: - There is no evidence of deep vein thrombosis in the lower extremity.  - No cystic structure found in the popliteal fossa.  LEFT: - No evidence of common femoral vein obstruction.  *See table(s) above for measurements and observations. Electronically signed by Harold Barban MD on 03/12/2022 at 12:46:35 AM.    Final    DG Chest 2 View  Result Date: 03/10/2022 CLINICAL DATA:  Sepsis. EXAM: CHEST - 2 VIEW COMPARISON:  Chest x-ray 10/01/2021 FINDINGS: Heart is enlarged, unchanged. There is central pulmonary vascular congestion. No focal lung infiltrate, pleural effusion or pneumothorax identified. Generator overlies the right chest. Bilateral shoulder arthroplasties are present. No acute fractures are seen. IMPRESSION: Cardiomegaly with central pulmonary vascular congestion. Electronically Signed   By: Ronney Asters M.D.   On: 03/10/2022 23:06   DG Knee Complete 4 Views  Right  Result Date: 03/10/2022 CLINICAL DATA:  Surgery, fever.  Knee surgery on 07/10. EXAM: RIGHT KNEE - COMPLETE 4+ VIEW COMPARISON:  None Available. FINDINGS: There is a right knee total arthroplasty in anatomic alignment. There is no fracture. There is no hardware loosening. There is diffuse soft tissue swelling of the anterior knee with small amount of soft tissue air. IMPRESSION: 1. Anterior soft tissue swelling with small amount of air, most likely related to recent surgery. Please correlate clinically for infection. 2. Right knee arthroplasty appears uncomplicated. Electronically Signed   By: Ronney Asters M.D.   On: 03/10/2022 21:22    Microbiology: Results for orders placed or performed during the hospital encounter of 03/10/22  Blood culture (routine x 2)     Status: None (Preliminary result)   Collection Time: 03/10/22  9:10 PM   Specimen: BLOOD LEFT FOREARM  Result Value Ref Range Status   Specimen Description   Final    BLOOD LEFT FOREARM Performed at [facility] [address], [place], [place] [address]    Special Requests   Final    BOTTLES DRAWN AEROBIC AND ANAEROBIC Blood Culture results may not be optimal due to an inadequate volume of blood received in culture bottles Performed at [facility] [address] [place], [place] [address]    Culture   Final    NO GROWTH 2 DAYS Performed at [facility] [address] [place], [place] [address]    Report Status PENDING  Incomplete  Blood culture (routine x 2)     Status: None (Preliminary result)   Collection Time: 03/10/22 10:30 PM   Specimen: BLOOD  Result Value Ref Range Status   Specimen Description   Final    BLOOD RIGHT ANTECUBITAL Performed at [facility] [address] [place], [place] [address]    Special Requests   Final    BOTTLES DRAWN AEROBIC AND ANAEROBIC Blood Culture adequate volume Performed at [facility] [address], [place], [place] [address]    Culture   Final    NO GROWTH 1 DAY Performed at [facility], [place] [address] [place], [place] [address]    Report Status PENDING  Incomplete    Labs: CBC: Recent Labs  Lab 03/10/22 2110 03/11/22 0735 03/12/22 0323  WBC 10.0 6.9 6.9  NEUTROABS 6.6  --   --   HGB 14.6 12.3* 13.6  HCT 43.9 35.7* 40.0  MCV 106.3* 105.6* 106.4*  PLT 93* 85* 638*   Basic Metabolic Panel: Recent Labs  Lab 03/10/22 2110 03/11/22 0735 03/12/22 0323  NA 139 136 142  K 4.2 4.4 3.7  CL 104 105 105  CO2 26 23 29   GLUCOSE 143* 348* 66*  BUN 13 14 16   CREATININE 0.99 0.87 0.88  CALCIUM 9.1 8.4* 8.8*   Liver Function Tests: Recent Labs  Lab 03/11/22 0735 03/12/22 0323  AST 33 37  ALT 33 33  ALKPHOS 65 67  BILITOT 2.4* 2.1*  PROT 6.0* 6.2*  ALBUMIN 2.6* 2.8*   CBG: Recent Labs  Lab 03/11/22 1651 03/11/22 2216 03/12/22 0021 03/12/22 0619 03/12/22 0738  GLUCAP 224* 86 75 98 110*    Discharge time spent: greater than 30 minutes.  Signed: Jennye Boroughs, MD [facility] 03/12/2022

## 2022-03-15 ENCOUNTER — Other Ambulatory Visit: Payer: Self-pay | Admitting: Family Medicine

## 2022-03-15 DIAGNOSIS — I480 Paroxysmal atrial fibrillation: Secondary | ICD-10-CM | POA: Diagnosis not present

## 2022-03-15 DIAGNOSIS — R600 Localized edema: Secondary | ICD-10-CM

## 2022-03-15 DIAGNOSIS — Z471 Aftercare following joint replacement surgery: Secondary | ICD-10-CM | POA: Diagnosis not present

## 2022-03-15 DIAGNOSIS — N200 Calculus of kidney: Secondary | ICD-10-CM | POA: Diagnosis not present

## 2022-03-15 DIAGNOSIS — E669 Obesity, unspecified: Secondary | ICD-10-CM | POA: Diagnosis not present

## 2022-03-15 DIAGNOSIS — N400 Enlarged prostate without lower urinary tract symptoms: Secondary | ICD-10-CM | POA: Diagnosis not present

## 2022-03-15 DIAGNOSIS — I479 Paroxysmal tachycardia, unspecified: Secondary | ICD-10-CM | POA: Diagnosis not present

## 2022-03-15 DIAGNOSIS — K766 Portal hypertension: Secondary | ICD-10-CM | POA: Diagnosis not present

## 2022-03-15 DIAGNOSIS — K5790 Diverticulosis of intestine, part unspecified, without perforation or abscess without bleeding: Secondary | ICD-10-CM | POA: Diagnosis not present

## 2022-03-15 DIAGNOSIS — G4733 Obstructive sleep apnea (adult) (pediatric): Secondary | ICD-10-CM | POA: Diagnosis not present

## 2022-03-15 DIAGNOSIS — J45909 Unspecified asthma, uncomplicated: Secondary | ICD-10-CM | POA: Diagnosis not present

## 2022-03-15 DIAGNOSIS — I119 Hypertensive heart disease without heart failure: Secondary | ICD-10-CM | POA: Diagnosis not present

## 2022-03-15 DIAGNOSIS — E785 Hyperlipidemia, unspecified: Secondary | ICD-10-CM | POA: Diagnosis not present

## 2022-03-15 DIAGNOSIS — I700 Atherosclerosis of aorta: Secondary | ICD-10-CM | POA: Diagnosis not present

## 2022-03-15 DIAGNOSIS — K219 Gastro-esophageal reflux disease without esophagitis: Secondary | ICD-10-CM | POA: Diagnosis not present

## 2022-03-15 DIAGNOSIS — Z96651 Presence of right artificial knee joint: Secondary | ICD-10-CM | POA: Diagnosis not present

## 2022-03-15 DIAGNOSIS — E1169 Type 2 diabetes mellitus with other specified complication: Secondary | ICD-10-CM | POA: Diagnosis not present

## 2022-03-15 LAB — CULTURE, BLOOD (ROUTINE X 2): Culture: NO GROWTH

## 2022-03-16 LAB — CULTURE, BLOOD (ROUTINE X 2)
Culture: NO GROWTH
Special Requests: ADEQUATE

## 2022-03-17 DIAGNOSIS — M1712 Unilateral primary osteoarthritis, left knee: Secondary | ICD-10-CM | POA: Diagnosis not present

## 2022-03-17 DIAGNOSIS — Z9889 Other specified postprocedural states: Secondary | ICD-10-CM | POA: Diagnosis not present

## 2022-03-17 DIAGNOSIS — Z96651 Presence of right artificial knee joint: Secondary | ICD-10-CM | POA: Diagnosis not present

## 2022-03-17 NOTE — Telephone Encounter (Signed)
I called and spoke to the patient on Monday 7/17 he is back home. He came home on Saturday. He was still unable to walk on his knee at this time. As far as the inspire device he feels like he is getting subjective benefit from it. He's sleeping more now. He's been able to go all the way to level 8. But he would like to wait a little longer on his titration study until he is feeling better from the knee surgery. He stated he will have have surgery on the other knee in about another month. I spoke to [place] from [place] and he said it was fine for him to wait as long as it was okay with Dr. Halford Chessman and the patient was getting subjective benefit and not having issues with using the device.

## 2022-03-21 DIAGNOSIS — I479 Paroxysmal tachycardia, unspecified: Secondary | ICD-10-CM | POA: Diagnosis not present

## 2022-03-21 DIAGNOSIS — E1169 Type 2 diabetes mellitus with other specified complication: Secondary | ICD-10-CM | POA: Diagnosis not present

## 2022-03-21 DIAGNOSIS — N200 Calculus of kidney: Secondary | ICD-10-CM | POA: Diagnosis not present

## 2022-03-21 DIAGNOSIS — K5790 Diverticulosis of intestine, part unspecified, without perforation or abscess without bleeding: Secondary | ICD-10-CM | POA: Diagnosis not present

## 2022-03-21 DIAGNOSIS — Z96651 Presence of right artificial knee joint: Secondary | ICD-10-CM | POA: Diagnosis not present

## 2022-03-21 DIAGNOSIS — N400 Enlarged prostate without lower urinary tract symptoms: Secondary | ICD-10-CM | POA: Diagnosis not present

## 2022-03-21 DIAGNOSIS — I119 Hypertensive heart disease without heart failure: Secondary | ICD-10-CM | POA: Diagnosis not present

## 2022-03-21 DIAGNOSIS — E785 Hyperlipidemia, unspecified: Secondary | ICD-10-CM | POA: Diagnosis not present

## 2022-03-21 DIAGNOSIS — Z471 Aftercare following joint replacement surgery: Secondary | ICD-10-CM | POA: Diagnosis not present

## 2022-03-21 DIAGNOSIS — I480 Paroxysmal atrial fibrillation: Secondary | ICD-10-CM | POA: Diagnosis not present

## 2022-03-21 DIAGNOSIS — I700 Atherosclerosis of aorta: Secondary | ICD-10-CM | POA: Diagnosis not present

## 2022-03-21 DIAGNOSIS — K219 Gastro-esophageal reflux disease without esophagitis: Secondary | ICD-10-CM | POA: Diagnosis not present

## 2022-03-21 DIAGNOSIS — K766 Portal hypertension: Secondary | ICD-10-CM | POA: Diagnosis not present

## 2022-03-21 DIAGNOSIS — J45909 Unspecified asthma, uncomplicated: Secondary | ICD-10-CM | POA: Diagnosis not present

## 2022-03-21 DIAGNOSIS — G4733 Obstructive sleep apnea (adult) (pediatric): Secondary | ICD-10-CM | POA: Diagnosis not present

## 2022-03-21 DIAGNOSIS — E669 Obesity, unspecified: Secondary | ICD-10-CM | POA: Diagnosis not present

## 2022-03-23 DIAGNOSIS — I479 Paroxysmal tachycardia, unspecified: Secondary | ICD-10-CM | POA: Diagnosis not present

## 2022-03-23 DIAGNOSIS — Z471 Aftercare following joint replacement surgery: Secondary | ICD-10-CM | POA: Diagnosis not present

## 2022-03-23 DIAGNOSIS — I119 Hypertensive heart disease without heart failure: Secondary | ICD-10-CM | POA: Diagnosis not present

## 2022-03-23 DIAGNOSIS — E669 Obesity, unspecified: Secondary | ICD-10-CM | POA: Diagnosis not present

## 2022-03-23 DIAGNOSIS — I480 Paroxysmal atrial fibrillation: Secondary | ICD-10-CM | POA: Diagnosis not present

## 2022-03-23 DIAGNOSIS — N400 Enlarged prostate without lower urinary tract symptoms: Secondary | ICD-10-CM | POA: Diagnosis not present

## 2022-03-23 DIAGNOSIS — E1169 Type 2 diabetes mellitus with other specified complication: Secondary | ICD-10-CM | POA: Diagnosis not present

## 2022-03-23 DIAGNOSIS — E785 Hyperlipidemia, unspecified: Secondary | ICD-10-CM | POA: Diagnosis not present

## 2022-03-23 DIAGNOSIS — K5790 Diverticulosis of intestine, part unspecified, without perforation or abscess without bleeding: Secondary | ICD-10-CM | POA: Diagnosis not present

## 2022-03-23 DIAGNOSIS — K219 Gastro-esophageal reflux disease without esophagitis: Secondary | ICD-10-CM | POA: Diagnosis not present

## 2022-03-23 DIAGNOSIS — N200 Calculus of kidney: Secondary | ICD-10-CM | POA: Diagnosis not present

## 2022-03-23 DIAGNOSIS — I700 Atherosclerosis of aorta: Secondary | ICD-10-CM | POA: Diagnosis not present

## 2022-03-23 DIAGNOSIS — G4733 Obstructive sleep apnea (adult) (pediatric): Secondary | ICD-10-CM | POA: Diagnosis not present

## 2022-03-23 DIAGNOSIS — Z96651 Presence of right artificial knee joint: Secondary | ICD-10-CM | POA: Diagnosis not present

## 2022-03-23 DIAGNOSIS — K766 Portal hypertension: Secondary | ICD-10-CM | POA: Diagnosis not present

## 2022-03-23 DIAGNOSIS — J45909 Unspecified asthma, uncomplicated: Secondary | ICD-10-CM | POA: Diagnosis not present

## 2022-03-25 DIAGNOSIS — G4733 Obstructive sleep apnea (adult) (pediatric): Secondary | ICD-10-CM | POA: Diagnosis not present

## 2022-03-25 DIAGNOSIS — K766 Portal hypertension: Secondary | ICD-10-CM | POA: Diagnosis not present

## 2022-03-25 DIAGNOSIS — I119 Hypertensive heart disease without heart failure: Secondary | ICD-10-CM | POA: Diagnosis not present

## 2022-03-25 DIAGNOSIS — I700 Atherosclerosis of aorta: Secondary | ICD-10-CM | POA: Diagnosis not present

## 2022-03-25 DIAGNOSIS — K219 Gastro-esophageal reflux disease without esophagitis: Secondary | ICD-10-CM | POA: Diagnosis not present

## 2022-03-25 DIAGNOSIS — E1169 Type 2 diabetes mellitus with other specified complication: Secondary | ICD-10-CM | POA: Diagnosis not present

## 2022-03-25 DIAGNOSIS — Z96651 Presence of right artificial knee joint: Secondary | ICD-10-CM | POA: Diagnosis not present

## 2022-03-25 DIAGNOSIS — N400 Enlarged prostate without lower urinary tract symptoms: Secondary | ICD-10-CM | POA: Diagnosis not present

## 2022-03-25 DIAGNOSIS — K5790 Diverticulosis of intestine, part unspecified, without perforation or abscess without bleeding: Secondary | ICD-10-CM | POA: Diagnosis not present

## 2022-03-25 DIAGNOSIS — E669 Obesity, unspecified: Secondary | ICD-10-CM | POA: Diagnosis not present

## 2022-03-25 DIAGNOSIS — N200 Calculus of kidney: Secondary | ICD-10-CM | POA: Diagnosis not present

## 2022-03-25 DIAGNOSIS — E785 Hyperlipidemia, unspecified: Secondary | ICD-10-CM | POA: Diagnosis not present

## 2022-03-25 DIAGNOSIS — J45909 Unspecified asthma, uncomplicated: Secondary | ICD-10-CM | POA: Diagnosis not present

## 2022-03-25 DIAGNOSIS — I480 Paroxysmal atrial fibrillation: Secondary | ICD-10-CM | POA: Diagnosis not present

## 2022-03-25 DIAGNOSIS — I479 Paroxysmal tachycardia, unspecified: Secondary | ICD-10-CM | POA: Diagnosis not present

## 2022-03-25 DIAGNOSIS — Z471 Aftercare following joint replacement surgery: Secondary | ICD-10-CM | POA: Diagnosis not present

## 2022-03-28 DIAGNOSIS — K5790 Diverticulosis of intestine, part unspecified, without perforation or abscess without bleeding: Secondary | ICD-10-CM | POA: Diagnosis not present

## 2022-03-28 DIAGNOSIS — J45909 Unspecified asthma, uncomplicated: Secondary | ICD-10-CM | POA: Diagnosis not present

## 2022-03-28 DIAGNOSIS — N400 Enlarged prostate without lower urinary tract symptoms: Secondary | ICD-10-CM | POA: Diagnosis not present

## 2022-03-28 DIAGNOSIS — E1169 Type 2 diabetes mellitus with other specified complication: Secondary | ICD-10-CM | POA: Diagnosis not present

## 2022-03-28 DIAGNOSIS — G4733 Obstructive sleep apnea (adult) (pediatric): Secondary | ICD-10-CM | POA: Diagnosis not present

## 2022-03-28 DIAGNOSIS — E785 Hyperlipidemia, unspecified: Secondary | ICD-10-CM | POA: Diagnosis not present

## 2022-03-28 DIAGNOSIS — N200 Calculus of kidney: Secondary | ICD-10-CM | POA: Diagnosis not present

## 2022-03-28 DIAGNOSIS — I480 Paroxysmal atrial fibrillation: Secondary | ICD-10-CM | POA: Diagnosis not present

## 2022-03-28 DIAGNOSIS — Z96651 Presence of right artificial knee joint: Secondary | ICD-10-CM | POA: Diagnosis not present

## 2022-03-28 DIAGNOSIS — Z471 Aftercare following joint replacement surgery: Secondary | ICD-10-CM | POA: Diagnosis not present

## 2022-03-28 DIAGNOSIS — E669 Obesity, unspecified: Secondary | ICD-10-CM | POA: Diagnosis not present

## 2022-03-28 DIAGNOSIS — K766 Portal hypertension: Secondary | ICD-10-CM | POA: Diagnosis not present

## 2022-03-28 DIAGNOSIS — I119 Hypertensive heart disease without heart failure: Secondary | ICD-10-CM | POA: Diagnosis not present

## 2022-03-28 DIAGNOSIS — K219 Gastro-esophageal reflux disease without esophagitis: Secondary | ICD-10-CM | POA: Diagnosis not present

## 2022-03-28 DIAGNOSIS — I479 Paroxysmal tachycardia, unspecified: Secondary | ICD-10-CM | POA: Diagnosis not present

## 2022-03-28 DIAGNOSIS — I700 Atherosclerosis of aorta: Secondary | ICD-10-CM | POA: Diagnosis not present

## 2022-03-30 ENCOUNTER — Ambulatory Visit: Payer: BC Managed Care – PPO | Attending: Orthopedic Surgery

## 2022-03-30 ENCOUNTER — Other Ambulatory Visit: Payer: Self-pay

## 2022-03-30 DIAGNOSIS — M25561 Pain in right knee: Secondary | ICD-10-CM | POA: Diagnosis not present

## 2022-03-30 DIAGNOSIS — M25661 Stiffness of right knee, not elsewhere classified: Secondary | ICD-10-CM | POA: Insufficient documentation

## 2022-03-30 NOTE — Therapy (Signed)
OUTPATIENT PHYSICAL THERAPY LOWER EXTREMITY EVALUATION   Patient Name: Tony Cantu MRN: [id number] DOB:May 17, 1951, 71 y.o., male Today's Date: 03/30/2022   PT End of Session - 03/30/22 1428     Visit Number 1    Number of Visits 12    Date for PT Re-Evaluation 05/13/22    PT Start Time 1438    PT Stop Time 1526    PT Time Calculation (min) 48 min    Activity Tolerance Patient tolerated treatment well    Behavior During Therapy [facility] for tasks assessed/performed             Past Medical History:  Diagnosis Date   Aortic atherosclerosis ([place])    Aortic stenosis    Arthritis    Asthma    BPH (benign prostatic hypertrophy)    Cirrhosis ([place]) 2016   Colon polyps    Diabetes mellitus without complication ([place])    TYPE 1    Diverticulosis    Dysrhythmia 04/19/2021   A-fib noted with possible A-fib RVR   Gastric ulcer    Gastric varices    right   Gastritis    GERD (gastroesophageal reflux disease)    Headache    History- pt states these were migraines that occurred in the 1980's   Heart murmur    History of kidney stones    Hx of adenomatous colonic polyps    Hypertension    PONV (postoperative nausea and vomiting)    pt states he has never had post op nausea or vomiting   Portal hypertensive gastropathy ([place])    Renal cyst, right    Seizures ([facility])    HYPOGLYCEMIC LAST 1 AND 1/2 YRS AGO   Sleep apnea    uses CPAP nightly   Testicle trouble    one testicle BORN WITH   Tubular adenoma of colon    Past Surgical History:  Procedure Laterality Date   BACK SURGERY     89  LOWER    CARPAL TUNNEL RELEASE Right 10/13/2015   Procedure: RIGHT CARPAL TUNNEL RELEASE;  Surgeon: Daryll Brod, MD;  Location: [place];  Service: Orthopedics;  Laterality: Right;   CARPAL TUNNEL RELEASE Left 07/05/2016   Procedure: LEFT CARPAL TUNNEL RELEASE;  Surgeon: Daryll Brod, MD;  Location: [place];  Service: Orthopedics;  Laterality: Left;   DRUG  INDUCED ENDOSCOPY N/A 07/02/2021   Procedure: DRUG INDUCED SLEEP ENDOSCOPY;  Surgeon: Melida Quitter, MD;  Location: [place];  Service: ENT;  Laterality: N/A;   IMPLANTATION OF HYPOGLOSSAL NERVE STIMULATOR Right 10/01/2021   Procedure: IMPLANTATION OF HYPOGLOSSAL NERVE STIMULATOR;  Surgeon: Melida Quitter, MD;  Location: [place];  Service: ENT;  Laterality: Right;   INGUINAL HERNIA REPAIR  2003   right    KNEE ARTHROSCOPY Right 03/03/2016   LUMBAR DISC SURGERY  03/1995   Dr. Coralyn Mark, discectomy   LUMBAR LAMINECTOMY/DECOMPRESSION MICRODISCECTOMY Right 10/06/2016   Procedure: RIGHT LUMBAR THREE - LUMBAR FOUR  LAMINECTOMY, FORAMINOTOMY AND MICRODISCECTOMY;  Surgeon: Jovita Gamma, MD;  Location: [place];  Service: Neurosurgery;  Laterality: Right;   SHOULDER SURGERY  11/28/2005   left partial   SHOULDER SURGERY  07/14/2006   RIGHT   TONSILLECTOMY  AGE 57 OR 4   TOTAL KNEE ARTHROPLASTY Right 03/07/2022   Procedure: RIGHT TOTAL KNEE ARTHROPLASTY;  Surgeon: Frederik Pear, MD;  [facility];  Service: Orthopedics;  Laterality: Right;   TOTAL SHOULDER ARTHROPLASTY Right 11/01/2018   Procedure: RIGHT SHOULDER REVISION TO REVERSE  TOTAL SHOULDER;  Surgeon: Tania Ade, MD;  [facility];  Service: Orthopedics;  Laterality: Right;  CHOICE ANESTHESIA WITH INTERSCALENE BLOCK EXPAREL, NEEDS RNFA   Patient Active Problem List   Diagnosis Date Noted   Grade I diastolic dysfunction 48/08/6551   Postoperative fever 03/10/2022   S/P TKR (total knee replacement), right 03/07/2022   Osteoarthritis of right knee 03/04/2022   Precordial chest pain 04/20/2021   Former smoker 01/12/2021   PAF (paroxysmal atrial fibrillation) ([place]) 06/22/2020   Hypertension associated with type 2 diabetes mellitus ([place]) 05/17/2019   Hyperlipidemia associated with type 2 diabetes mellitus ([place]) 05/17/2019   GERD with esophagitis 05/17/2019   H/O total shoulder replacement, right 11/01/2018   Paroxysmal tachycardia ([place])  01/11/2017   Nonrheumatic aortic valve stenosis 01/11/2017   Aortic atherosclerosis ([place]) 11/23/2016   HNP (herniated nucleus pulposus), lumbar 10/06/2016   Acute renal injury ([place]) 08/06/2016   Diarrhea 08/06/2016   Fever 08/06/2016   Hyperbilirubinemia 08/06/2016   Lactic acidosis 08/06/2016   Inguinal hernia 12/10/2015   Right groin pain 11/30/2015   Carpal tunnel syndrome on right 09/09/2015   Cervical spondylosis without myelopathy 09/09/2015   Thrombocytopenia ([place]) 07/21/2014   Bilateral carotid bruits 05/11/2014   Vitamin D deficiency 09/17/2013   BPH (benign prostatic hyperplasia) 05/22/2013   Low serum testosterone level 02/18/2013   Diabetes type 2, controlled ([place]) 01/10/2013   OSA (obstructive sleep apnea) 05/16/2012   Edema 02/21/2012   At risk for coronary artery disease 03/20/2011   Obesity 03/20/2011   Allergic rhinitis 06/22/2010   ASTHMA 06/22/2010   COUGH 06/22/2010    PCP: [name], DO  REFERRING PROVIDER: Frederik Pear, MD  REFERRING DIAG: Right total knee arthroplasty  THERAPY DIAG:  Acute pain of right knee  Stiffness of right knee, not elsewhere classified  Rationale for Evaluation and Treatment Rehabilitation  ONSET DATE: 03/07/22  SUBJECTIVE:   SUBJECTIVE STATEMENT: Patient reports that he had a right total knee replacement on 03/07/22. He was discharged from the hospital on 7/11. He then developed a fever and had to go to the hospital on 7/13. He had home health physical therapy for 5 visits and discharged on 7/31. He notes that his knee flexion was 116 at his last appointment. He has begun to walk without a cane for short distances.   PERTINENT HISTORY: HTN, DM, OA, Left TKA scheduled for 05/2022  PAIN:  Are you having pain? Yes: NPRS scale: 2-3/10 Pain location: Right knee Pain description: aching Aggravating factors: none known  Relieving factors: medication, ice  PRECAUTIONS: None  WEIGHT BEARING RESTRICTIONS  No  FALLS:  Has patient fallen in last 6 months? No  LIVING ENVIRONMENT: Lives with: lives with their spouse Lives in: House/apartment Stairs: Yes: Internal: 20-25 steps; can reach both and External: 2 steps; can reach both; he is still going "up with the good and down with the bad"  Has following equipment at home: Single point cane  OCCUPATION: retired  PLOF: Independent  NEXT MD FOLLOW UP: 04/14/22  PATIENT GOALS be able to get outside and take pictures, play golf,    OBJECTIVE:   PATIENT SURVEYS:  FOTO 28.71  COGNITION:  Overall cognitive status: Within functional limits for tasks assessed     SENSATION: WFL  PALPATION: TTP: right hamstring and gastroc/soleus  LOWER EXTREMITY ROM:  Active ROM Right eval Right PROM eval Left eval  Hip flexion     Hip extension     Hip abduction  Hip adduction     Hip internal rotation     Hip external rotation     Knee flexion 107 113 130  Knee extension 7  0  Ankle dorsiflexion     Ankle plantarflexion     Ankle inversion     Ankle eversion      (Blank rows = not tested)    GAIT: Assistive device utilized: Single point cane Level of assistance: Modified independence Comments: decreased gait speed, stride length, and weight shift onto RLE    TODAY'S TREATMENT: Modalities  Date:  Vaso: Knee, 34 degrees, 10 mins, Pain and Edema  PATIENT EDUCATION:  Education details: healing, POC, prognosis Person educated: Patient Education method: Explanation Education comprehension: verbalized understanding   HOME EXERCISE PROGRAM:   ASSESSMENT:  CLINICAL IMPRESSION: Patient is a 71 y.o. male who was seen today for physical therapy evaluation and treatment for right total knee replacement on 03/07/22. He exhibited low pain severity and irritability with active and passive knee flexion recreating his familiar symptoms. He demonstrated mild AROM limitations, but he remains limited with functional activities such as  navigating stairs and transfers. He required upper extremity support with sit to stand transfers. Recommend that he continue with skilled physical therapy to address his remaining impairments to return to his prior level of function.     OBJECTIVE IMPAIRMENTS Abnormal gait, decreased activity tolerance, decreased mobility, difficulty walking, decreased ROM, decreased strength, and pain.   ACTIVITY LIMITATIONS carrying, lifting, bending, standing, squatting, sleeping, stairs, transfers, bed mobility, dressing, and locomotion level  PARTICIPATION LIMITATIONS: cleaning, driving, shopping, community activity, and yard work  PERSONAL FACTORS 3+ comorbidities: HTN, DM, OA   are also affecting patient's functional outcome.   REHAB POTENTIAL: Excellent  CLINICAL DECISION MAKING: Stable/uncomplicated  EVALUATION COMPLEXITY: Low   GOALS: Goals reviewed with patient? Yes  SHORT TERM GOALS: Target date: 04/20/2022  Patient will be independent with his initial HEP.  Baseline: Goal status: INITIAL  LONG TERM GOALS: Target date: 05/11/2022  Patient will be independent with his advanced HEP.  Baseline:  Goal status: INITIAL  2.  Patient will be able to demonstrate active knee extension within 5 degrees of neutral.  Baseline:  Goal status: INITIAL  3.  Patient will be able to demonstrate at least 120 degree of active right knee flexion for improved function navigating stairs.  Baseline:  Goal status: INITIAL  4.  Patient will be able to navigate at least 4 steps with a reciprocal pattern for improved function with household navigation.  Baseline:  Goal status: INITIAL  PLAN: PT FREQUENCY:  2-3x/ week  PT DURATION: other: 4-6 weeks  PLANNED INTERVENTIONS: Therapeutic exercises, Therapeutic activity, Neuromuscular re-education, Balance training, Gait training, Patient/Family education, Self Care, Joint mobilization, Stair training, Electrical stimulation, Cryotherapy, Moist heat,  Vasopneumatic device, Manual therapy, and Re-evaluation  PLAN FOR NEXT SESSION: recumbent bike, SLR, squats, and modalities as needed    Darlin Coco, PT 03/30/2022, 4:06 PM

## 2022-03-31 NOTE — Telephone Encounter (Addendum)
03/31/2022-Received a message back from Dr. Halford Chessman today in regards to Mr. Demaria waiting. He said he was okay with waiting until he feels he has recovered better after knee surgery. I called Mr. Schmaltz to let him know what Dr. Halford Chessman said and to check on him. He will be having another knee surgery in October. He is currently feeling much better. He's going to therapy 2 times a week, using a walker/cane and driving some. He said he would like to do the titration study in Sept if possible before his next knee surgery. We will work on getting the authorization for the titration once we have that we will get it scheduled. Once we have it scheduled we will see about bringing Mr. Jares in for a follow up 1-2 weeks before his titration, just to make sure everything is still going good and nothing needs to be adjusted before his study due to everything that's went on since his last follow up.

## 2022-04-01 ENCOUNTER — Ambulatory Visit: Payer: BC Managed Care – PPO

## 2022-04-01 DIAGNOSIS — M25661 Stiffness of right knee, not elsewhere classified: Secondary | ICD-10-CM | POA: Diagnosis not present

## 2022-04-01 DIAGNOSIS — M25561 Pain in right knee: Secondary | ICD-10-CM | POA: Diagnosis not present

## 2022-04-01 NOTE — Therapy (Signed)
OUTPATIENT PHYSICAL THERAPY LOWER EXTREMITY TREATMENT   Patient Name: Tony Cantu MRN: [id number] DOB:11-Jul-1951, 71 y.o., male Today's Date: 04/01/2022   PT End of Session - 04/01/22 0739     Visit Number 2    Number of Visits 12    Date for PT Re-Evaluation 05/13/22    PT Start Time 0733    PT Stop Time 0825    PT Time Calculation (min) 52 min    Activity Tolerance Patient tolerated treatment well    Behavior During Therapy [facility] for tasks assessed/performed              Past Medical History:  Diagnosis Date   Aortic atherosclerosis ([place])    Aortic stenosis    Arthritis    Asthma    BPH (benign prostatic hypertrophy)    Cirrhosis ([place]) 2016   Colon polyps    Diabetes mellitus without complication ([place])    TYPE 1    Diverticulosis    Dysrhythmia 04/19/2021   A-fib noted with possible A-fib RVR   Gastric ulcer    Gastric varices    right   Gastritis    GERD (gastroesophageal reflux disease)    Headache    History- pt states these were migraines that occurred in the 1980's   Heart murmur    History of kidney stones    Hx of adenomatous colonic polyps    Hypertension    PONV (postoperative nausea and vomiting)    pt states he has never had post op nausea or vomiting   Portal hypertensive gastropathy ([place])    Renal cyst, right    Seizures (H[facility])    HYPOGLYCEMIC LAST 1 AND 1/2 YRS AGO   Sleep apnea    uses CPAP nightly   Testicle trouble    one testicle BORN WITH   Tubular adenoma of colon    Past Surgical History:  Procedure Laterality Date   BACK SURGERY     89  LOWER    CARPAL TUNNEL RELEASE Right 10/13/2015   Procedure: RIGHT CARPAL TUNNEL RELEASE;  Surgeon: Daryll Brod, MD;  Location: [place];  Service: Orthopedics;  Laterality: Right;   CARPAL TUNNEL RELEASE Left 07/05/2016   Procedure: LEFT CARPAL TUNNEL RELEASE;  Surgeon: Daryll Brod, MD;  Location: [place];  Service: Orthopedics;  Laterality: Left;   DRUG  INDUCED ENDOSCOPY N/A 07/02/2021   Procedure: DRUG INDUCED SLEEP ENDOSCOPY;  Surgeon: Melida Quitter, MD;  Location: [place];  Service: ENT;  Laterality: N/A;   IMPLANTATION OF HYPOGLOSSAL NERVE STIMULATOR Right 10/01/2021   Procedure: IMPLANTATION OF HYPOGLOSSAL NERVE STIMULATOR;  Surgeon: Melida Quitter, MD;  Location: [place];  Service: ENT;  Laterality: Right;   INGUINAL HERNIA REPAIR  2003   right    KNEE ARTHROSCOPY Right 03/03/2016   LUMBAR DISC SURGERY  03/1995   Dr. Coralyn Mark, discectomy   LUMBAR LAMINECTOMY/DECOMPRESSION MICRODISCECTOMY Right 10/06/2016   Procedure: RIGHT LUMBAR THREE - LUMBAR FOUR  LAMINECTOMY, FORAMINOTOMY AND MICRODISCECTOMY;  Surgeon: Jovita Gamma, MD;  Location: [place];  Service: Neurosurgery;  Laterality: Right;   SHOULDER SURGERY  11/28/2005   left partial   SHOULDER SURGERY  07/14/2006   RIGHT   TONSILLECTOMY  AGE 22 OR 4   TOTAL KNEE ARTHROPLASTY Right 03/07/2022   Procedure: RIGHT TOTAL KNEE ARTHROPLASTY;  Surgeon: Frederik Pear, MD;  [facility];  Service: Orthopedics;  Laterality: Right;   TOTAL SHOULDER ARTHROPLASTY Right 11/01/2018   Procedure: RIGHT SHOULDER REVISION TO  REVERSE TOTAL SHOULDER;  Surgeon: Tania Ade, MD;  [facility];  Service: Orthopedics;  Laterality: Right;  CHOICE ANESTHESIA WITH INTERSCALENE BLOCK EXPAREL, NEEDS RNFA   Patient Active Problem List   Diagnosis Date Noted   Grade I diastolic dysfunction 87/56/4332   Postoperative fever 03/10/2022   S/P TKR (total knee replacement), right 03/07/2022   Osteoarthritis of right knee 03/04/2022   Precordial chest pain 04/20/2021   Former smoker 01/12/2021   PAF (paroxysmal atrial fibrillation) ([place]) 06/22/2020   Hypertension associated with type 2 diabetes mellitus ([place]) 05/17/2019   Hyperlipidemia associated with type 2 diabetes mellitus ([place]) 05/17/2019   GERD with esophagitis 05/17/2019   H/O total shoulder replacement, right 11/01/2018   Paroxysmal tachycardia ([place])  01/11/2017   Nonrheumatic aortic valve stenosis 01/11/2017   Aortic atherosclerosis ([place]) 11/23/2016   HNP (herniated nucleus pulposus), lumbar 10/06/2016   Acute renal injury ([place]) 08/06/2016   Diarrhea 08/06/2016   Fever 08/06/2016   Hyperbilirubinemia 08/06/2016   Lactic acidosis 08/06/2016   Inguinal hernia 12/10/2015   Right groin pain 11/30/2015   Carpal tunnel syndrome on right 09/09/2015   Cervical spondylosis without myelopathy 09/09/2015   Thrombocytopenia ([place]) 07/21/2014   Bilateral carotid bruits 05/11/2014   Vitamin D deficiency 09/17/2013   BPH (benign prostatic hyperplasia) 05/22/2013   Low serum testosterone level 02/18/2013   Diabetes type 2, controlled ([place]) 01/10/2013   OSA (obstructive sleep apnea) 05/16/2012   Edema 02/21/2012   At risk for coronary artery disease 03/20/2011   Obesity 03/20/2011   Allergic rhinitis 06/22/2010   ASTHMA 06/22/2010   COUGH 06/22/2010    PCP: [name], DO  REFERRING PROVIDER: Frederik Pear, MD  REFERRING DIAG: Right total knee arthroplasty  THERAPY DIAG:  Acute pain of right knee  Stiffness of right knee, not elsewhere classified  Rationale for Evaluation and Treatment Rehabilitation  ONSET DATE: 03/07/22  SUBJECTIVE:   SUBJECTIVE STATEMENT: Patient reports that his knee feels about the same as it did at his evaluation.   PERTINENT HISTORY: HTN, DM, OA, Left TKA scheduled for 05/2022  PAIN:  Are you having pain? Yes: NPRS scale: 2/10 Pain location: Right knee Pain description: aching Aggravating factors: none known  Relieving factors: medication, ice  PRECAUTIONS: None  WEIGHT BEARING RESTRICTIONS No  FALLS:  Has patient fallen in last 6 months? No  LIVING ENVIRONMENT: Lives with: lives with their spouse Lives in: House/apartment Stairs: Yes: Internal: 20-25 steps; can reach both and External: 2 steps; can reach both; he is still going "up with the good and down with the bad"  Has  following equipment at home: Single point cane  OCCUPATION: retired  PLOF: Independent  NEXT MD FOLLOW UP: 04/14/22  PATIENT GOALS be able to get outside and take pictures, play golf,    OBJECTIVE: all assessments were performed at his initial evaluation on 03/30/22 unless otherwise noted  PATIENT SURVEYS:  FOTO 28.71  COGNITION:  Overall cognitive status: Within functional limits for tasks assessed     SENSATION: WFL  PALPATION: TTP: right hamstring and gastroc/soleus  LOWER EXTREMITY ROM:  Active ROM Right eval Right PROM eval Left eval  Hip flexion     Hip extension     Hip abduction     Hip adduction     Hip internal rotation     Hip external rotation     Knee flexion 107 113 130  Knee extension 7  0  Ankle dorsiflexion     Ankle plantarflexion  Ankle inversion     Ankle eversion      (Blank rows = not tested)    GAIT: Assistive device utilized: Single point cane Level of assistance: Modified independence Comments: decreased gait speed, stride length, and weight shift onto RLE    TODAY'S TREATMENT:                                   8/4 EXERCISE LOG  Exercise Repetitions and Resistance Comments  Recumbent bike L2-4 x 10 minutes   Rocker board  4.5 minutes   Lunges  Onto 14" step; 30 reps   Cybex knee flexion  50# x 2 minutes   Cybex knee extension 10# x 2 minutes    Blank cell = exercise not performed today  Modalities  Date:  Vaso: Knee, 34 degrees, 10 mins, Pain and Edema  PATIENT EDUCATION:  Education details: healing, POC, prognosis Person educated: Patient Education method: Explanation Education comprehension: verbalized understanding   HOME EXERCISE PROGRAM:   ASSESSMENT:  CLINICAL IMPRESSION: Patient was introduced to multiple new interventions for improved right knee mobility. He required minimal cueing with today's new interventions for proper exercise performance. Fatigue was his primary limitation with today's  interventions as he required multiple seated rest breaks throughout treatment. He reported feeling alright upon the conclusion of treatment. He continues to require skilled physical therapy to address his remaining impairments to return to his prior level of function.    OBJECTIVE IMPAIRMENTS Abnormal gait, decreased activity tolerance, decreased mobility, difficulty walking, decreased ROM, decreased strength, and pain.   ACTIVITY LIMITATIONS carrying, lifting, bending, standing, squatting, sleeping, stairs, transfers, bed mobility, dressing, and locomotion level  PARTICIPATION LIMITATIONS: cleaning, driving, shopping, community activity, and yard work  PERSONAL FACTORS 3+ comorbidities: HTN, DM, OA   are also affecting patient's functional outcome.   REHAB POTENTIAL: Excellent  CLINICAL DECISION MAKING: Stable/uncomplicated  EVALUATION COMPLEXITY: Low   GOALS: Goals reviewed with patient? Yes  SHORT TERM GOALS: Target date: 04/20/2022  Patient will be independent with his initial HEP.  Baseline: Goal status: INITIAL  LONG TERM GOALS: Target date: 05/11/2022  Patient will be independent with his advanced HEP.  Baseline:  Goal status: INITIAL  2.  Patient will be able to demonstrate active knee extension within 5 degrees of neutral.  Baseline:  Goal status: INITIAL  3.  Patient will be able to demonstrate at least 120 degree of active right knee flexion for improved function navigating stairs.  Baseline:  Goal status: INITIAL  4.  Patient will be able to navigate at least 4 steps with a reciprocal pattern for improved function with household navigation.  Baseline:  Goal status: INITIAL  PLAN: PT FREQUENCY:  2-3x/ week  PT DURATION: other: 4-6 weeks  PLANNED INTERVENTIONS: Therapeutic exercises, Therapeutic activity, Neuromuscular re-education, Balance training, Gait training, Patient/Family education, Self Care, Joint mobilization, Stair training, Electrical stimulation,  Cryotherapy, Moist heat, Vasopneumatic device, Manual therapy, and Re-evaluation  PLAN FOR NEXT SESSION: recumbent bike, SLR, squats, and modalities as needed    Darlin Coco, PT 04/01/2022, 12:24 PM

## 2022-04-04 ENCOUNTER — Ambulatory Visit: Payer: BC Managed Care – PPO

## 2022-04-04 DIAGNOSIS — M25561 Pain in right knee: Secondary | ICD-10-CM

## 2022-04-04 DIAGNOSIS — M25661 Stiffness of right knee, not elsewhere classified: Secondary | ICD-10-CM

## 2022-04-04 NOTE — Therapy (Signed)
OUTPATIENT PHYSICAL THERAPY LOWER EXTREMITY TREATMENT   Patient Name: Tony Cantu MRN: [id number] DOB:Jan 17, 1951, 71 y.o., male Today's Date: 04/04/2022   PT End of Session - 04/04/22 1347     Visit Number 3    Number of Visits 12    Date for PT Re-Evaluation 05/13/22    PT Start Time 1344    PT Stop Time 1439    PT Time Calculation (min) 55 min    Activity Tolerance Patient tolerated treatment well    Behavior During Therapy [facility] for tasks assessed/performed               Past Medical History:  Diagnosis Date   Aortic atherosclerosis ([place])    Aortic stenosis    Arthritis    Asthma    BPH (benign prostatic hypertrophy)    Cirrhosis ([place]) 2016   Colon polyps    Diabetes mellitus without complication ([place])    TYPE 1    Diverticulosis    Dysrhythmia 04/19/2021   A-fib noted with possible A-fib RVR   Gastric ulcer    Gastric varices    right   Gastritis    GERD (gastroesophageal reflux disease)    Headache    History- pt states these were migraines that occurred in the 1980's   Heart murmur    History of kidney stones    Hx of adenomatous colonic polyps    Hypertension    PONV (postoperative nausea and vomiting)    pt states he has never had post op nausea or vomiting   Portal hypertensive gastropathy ([place])    Renal cyst, right    Seizures (H[facility])    HYPOGLYCEMIC LAST 1 AND 1/2 YRS AGO   Sleep apnea    uses CPAP nightly   Testicle trouble    one testicle BORN WITH   Tubular adenoma of colon    Past Surgical History:  Procedure Laterality Date   BACK SURGERY     89  LOWER    CARPAL TUNNEL RELEASE Right 10/13/2015   Procedure: RIGHT CARPAL TUNNEL RELEASE;  Surgeon: Daryll Brod, MD;  Location: [place];  Service: Orthopedics;  Laterality: Right;   CARPAL TUNNEL RELEASE Left 07/05/2016   Procedure: LEFT CARPAL TUNNEL RELEASE;  Surgeon: Daryll Brod, MD;  Location: [place];  Service: Orthopedics;  Laterality: Left;   DRUG  INDUCED ENDOSCOPY N/A 07/02/2021   Procedure: DRUG INDUCED SLEEP ENDOSCOPY;  Surgeon: Melida Quitter, MD;  Location: [place];  Service: ENT;  Laterality: N/A;   IMPLANTATION OF HYPOGLOSSAL NERVE STIMULATOR Right 10/01/2021   Procedure: IMPLANTATION OF HYPOGLOSSAL NERVE STIMULATOR;  Surgeon: Melida Quitter, MD;  Location: [place];  Service: ENT;  Laterality: Right;   INGUINAL HERNIA REPAIR  2003   right    KNEE ARTHROSCOPY Right 03/03/2016   LUMBAR DISC SURGERY  03/1995   Dr. Coralyn Mark, discectomy   LUMBAR LAMINECTOMY/DECOMPRESSION MICRODISCECTOMY Right 10/06/2016   Procedure: RIGHT LUMBAR THREE - LUMBAR FOUR  LAMINECTOMY, FORAMINOTOMY AND MICRODISCECTOMY;  Surgeon: Jovita Gamma, MD;  Location: [place];  Service: Neurosurgery;  Laterality: Right;   SHOULDER SURGERY  11/28/2005   left partial   SHOULDER SURGERY  07/14/2006   RIGHT   TONSILLECTOMY  AGE 68 OR 4   TOTAL KNEE ARTHROPLASTY Right 03/07/2022   Procedure: RIGHT TOTAL KNEE ARTHROPLASTY;  Surgeon: Frederik Pear, MD;  [facility];  Service: Orthopedics;  Laterality: Right;   TOTAL SHOULDER ARTHROPLASTY Right 11/01/2018   Procedure: RIGHT SHOULDER REVISION  TO REVERSE TOTAL SHOULDER;  Surgeon: Tania Ade, MD;  [facility];  Service: Orthopedics;  Laterality: Right;  CHOICE ANESTHESIA WITH INTERSCALENE BLOCK EXPAREL, NEEDS RNFA   Patient Active Problem List   Diagnosis Date Noted   Grade I diastolic dysfunction 25/42/7062   Postoperative fever 03/10/2022   S/P TKR (total knee replacement), right 03/07/2022   Osteoarthritis of right knee 03/04/2022   Precordial chest pain 04/20/2021   Former smoker 01/12/2021   PAF (paroxysmal atrial fibrillation) ([place]) 06/22/2020   Hypertension associated with type 2 diabetes mellitus ([place]) 05/17/2019   Hyperlipidemia associated with type 2 diabetes mellitus ([place]) 05/17/2019   GERD with esophagitis 05/17/2019   H/O total shoulder replacement, right 11/01/2018   Paroxysmal tachycardia ([place])  01/11/2017   Nonrheumatic aortic valve stenosis 01/11/2017   Aortic atherosclerosis ([place]) 11/23/2016   HNP (herniated nucleus pulposus), lumbar 10/06/2016   Acute renal injury ([place]) 08/06/2016   Diarrhea 08/06/2016   Fever 08/06/2016   Hyperbilirubinemia 08/06/2016   Lactic acidosis 08/06/2016   Inguinal hernia 12/10/2015   Right groin pain 11/30/2015   Carpal tunnel syndrome on right 09/09/2015   Cervical spondylosis without myelopathy 09/09/2015   Thrombocytopenia ([place]) 07/21/2014   Bilateral carotid bruits 05/11/2014   Vitamin D deficiency 09/17/2013   BPH (benign prostatic hyperplasia) 05/22/2013   Low serum testosterone level 02/18/2013   Diabetes type 2, controlled ([place]) 01/10/2013   OSA (obstructive sleep apnea) 05/16/2012   Edema 02/21/2012   At risk for coronary artery disease 03/20/2011   Obesity 03/20/2011   Allergic rhinitis 06/22/2010   ASTHMA 06/22/2010   COUGH 06/22/2010    PCP: [name], DO  REFERRING PROVIDER: Frederik Pear, MD  REFERRING DIAG: Right total knee arthroplasty  THERAPY DIAG:  Acute pain of right knee  Stiffness of right knee, not elsewhere classified  Rationale for Evaluation and Treatment Rehabilitation  ONSET DATE: 03/07/22  SUBJECTIVE:   SUBJECTIVE STATEMENT: Patient reports that he was able to get into and out of his Corvette, but this was really hard and caused his knee to bend a lot.  PERTINENT HISTORY: HTN, DM, OA, Left TKA scheduled for 05/2022  PAIN:  Are you having pain? Yes: NPRS scale: 3/10 Pain location: Right knee Pain description: aching Aggravating factors: none known  Relieving factors: medication, ice  PRECAUTIONS: None  WEIGHT BEARING RESTRICTIONS No  FALLS:  Has patient fallen in last 6 months? No  LIVING ENVIRONMENT: Lives with: lives with their spouse Lives in: House/apartment Stairs: Yes: Internal: 20-25 steps; can reach both and External: 2 steps; can reach both; he is still going "up  with the good and down with the bad"  Has following equipment at home: Single point cane  OCCUPATION: retired  PLOF: Independent  NEXT MD FOLLOW UP: 04/14/22  PATIENT GOALS be able to get outside and take pictures, play golf,    OBJECTIVE: all assessments were performed at his initial evaluation on 03/30/22 unless otherwise noted  PATIENT SURVEYS:  FOTO 28.71  COGNITION:  Overall cognitive status: Within functional limits for tasks assessed     SENSATION: WFL  PALPATION: TTP: right hamstring and gastroc/soleus  LOWER EXTREMITY ROM:  Active ROM Right eval Right PROM eval Left eval  Hip flexion     Hip extension     Hip abduction     Hip adduction     Hip internal rotation     Hip external rotation     Knee flexion 107 113 130  Knee extension 7  0  Ankle dorsiflexion     Ankle plantarflexion     Ankle inversion     Ankle eversion      (Blank rows = not tested)    GAIT: Assistive device utilized: Single point cane Level of assistance: Modified independence Comments: decreased gait speed, stride length, and weight shift onto RLE    TODAY'S TREATMENT:                                   8/7 EXERCISE LOG  Exercise Repetitions and Resistance Comments  Recumbent bike  L3 x 12 minutes    Rocker board 5 minutes   Step up Onto 6" step x 25 reps   Leg press 1 plate; seat 7;    Cybex knee flexion  50# x 2.5 minutes    Cybex knee extension 10# x 2.5 minutes     Blank cell = exercise not performed today  Modalities  Date:  Vaso: Knee, 34 degrees, 10 mins, Pain and Edema                                   8/4 EXERCISE LOG  Exercise Repetitions and Resistance Comments  Recumbent bike L2-4 x 10 minutes   Rocker board  4.5 minutes   Lunges  Onto 14" step; 30 reps   Cybex knee flexion  50# x 2 minutes   Cybex knee extension 10# x 2 minutes    Blank cell = exercise not performed today  Modalities  Date:  Vaso: Knee, 34 degrees, 10 mins, Pain and  Edema  PATIENT EDUCATION:  Education details: scar massage, signs and symptoms of DVT Person educated: Patient Education method: Explanation Education comprehension: verbalized understanding   HOME EXERCISE PROGRAM:   ASSESSMENT:  CLINICAL IMPRESSION: Patient was progressed with step ups and leg press for improved lower extremity strength. He required minimal cueing with today's interventions for proper exercise performance and control. He reported that his knee was aching a little more upon the conclusion of treatment. He continues to require skilled physical therapy to address his remaining impairments to return to his prior level of function.    OBJECTIVE IMPAIRMENTS Abnormal gait, decreased activity tolerance, decreased mobility, difficulty walking, decreased ROM, decreased strength, and pain.   ACTIVITY LIMITATIONS carrying, lifting, bending, standing, squatting, sleeping, stairs, transfers, bed mobility, dressing, and locomotion level  PARTICIPATION LIMITATIONS: cleaning, driving, shopping, community activity, and yard work  PERSONAL FACTORS 3+ comorbidities: HTN, DM, OA   are also affecting patient's functional outcome.   REHAB POTENTIAL: Excellent  CLINICAL DECISION MAKING: Stable/uncomplicated  EVALUATION COMPLEXITY: Low   GOALS: Goals reviewed with patient? Yes  SHORT TERM GOALS: Target date: 04/20/2022  Patient will be independent with his initial HEP.  Baseline: Goal status: INITIAL  LONG TERM GOALS: Target date: 05/11/2022  Patient will be independent with his advanced HEP.  Baseline:  Goal status: INITIAL  2.  Patient will be able to demonstrate active knee extension within 5 degrees of neutral.  Baseline:  Goal status: INITIAL  3.  Patient will be able to demonstrate at least 120 degree of active right knee flexion for improved function navigating stairs.  Baseline:  Goal status: INITIAL  4.  Patient will be able to navigate at least 4 steps with a  reciprocal pattern for improved function with household navigation.  Baseline:  Goal status: INITIAL  PLAN: PT FREQUENCY:  2-3x/ week  PT DURATION: other: 4-6 weeks  PLANNED INTERVENTIONS: Therapeutic exercises, Therapeutic activity, Neuromuscular re-education, Balance training, Gait training, Patient/Family education, Self Care, Joint mobilization, Stair training, Electrical stimulation, Cryotherapy, Moist heat, Vasopneumatic device, Manual therapy, and Re-evaluation  PLAN FOR NEXT SESSION: recumbent bike, SLR, squats, and modalities as needed    Darlin Coco, PT 04/04/2022, 2:40 PM

## 2022-04-05 DIAGNOSIS — N182 Chronic kidney disease, stage 2 (mild): Secondary | ICD-10-CM | POA: Diagnosis not present

## 2022-04-05 DIAGNOSIS — Z794 Long term (current) use of insulin: Secondary | ICD-10-CM | POA: Diagnosis not present

## 2022-04-05 DIAGNOSIS — E1122 Type 2 diabetes mellitus with diabetic chronic kidney disease: Secondary | ICD-10-CM | POA: Diagnosis not present

## 2022-04-07 ENCOUNTER — Ambulatory Visit: Payer: BC Managed Care – PPO

## 2022-04-07 DIAGNOSIS — M25561 Pain in right knee: Secondary | ICD-10-CM

## 2022-04-07 DIAGNOSIS — M25661 Stiffness of right knee, not elsewhere classified: Secondary | ICD-10-CM

## 2022-04-07 NOTE — Therapy (Signed)
OUTPATIENT PHYSICAL THERAPY LOWER EXTREMITY TREATMENT   Patient Name: MARCH STEYER MRN: [id number] DOB:1951-08-22, 71 y.o., male Today's Date: 04/07/2022   PT End of Session - 04/07/22 0904     Visit Number 4    Number of Visits 12    Date for PT Re-Evaluation 05/13/22    PT Start Time 0900    PT Stop Time 1007    PT Time Calculation (min) 67 min    Activity Tolerance Patient tolerated treatment well    Behavior During Therapy [facility] for tasks assessed/performed                Past Medical History:  Diagnosis Date   Aortic atherosclerosis ([place])    Aortic stenosis    Arthritis    Asthma    BPH (benign prostatic hypertrophy)    Cirrhosis ([place]) 2016   Colon polyps    Diabetes mellitus without complication ([place])    TYPE 1    Diverticulosis    Dysrhythmia 04/19/2021   A-fib noted with possible A-fib RVR   Gastric ulcer    Gastric varices    right   Gastritis    GERD (gastroesophageal reflux disease)    Headache    History- pt states these were migraines that occurred in the 1980's   Heart murmur    History of kidney stones    Hx of adenomatous colonic polyps    Hypertension    PONV (postoperative nausea and vomiting)    pt states he has never had post op nausea or vomiting   Portal hypertensive gastropathy ([place])    Renal cyst, right    Seizures (HCC)    HYPOGLYCEMIC LAST 1 AND 1/2 YRS AGO   Sleep apnea    uses CPAP nightly   Testicle trouble    one testicle BORN WITH   Tubular adenoma of colon    Past Surgical History:  Procedure Laterality Date   BACK SURGERY     94  LOWER    CARPAL TUNNEL RELEASE Right 10/13/2015   Procedure: RIGHT CARPAL TUNNEL RELEASE;  Surgeon: Daryll Brod, MD;  Location: [place];  Service: Orthopedics;  Laterality: Right;   CARPAL TUNNEL RELEASE Left 07/05/2016   Procedure: LEFT CARPAL TUNNEL RELEASE;  Surgeon: Daryll Brod, MD;  Location: [place];  Service: Orthopedics;  Laterality: Left;   DRUG  INDUCED ENDOSCOPY N/A 07/02/2021   Procedure: DRUG INDUCED SLEEP ENDOSCOPY;  Surgeon: Melida Quitter, MD;  Location: [place];  Service: ENT;  Laterality: N/A;   IMPLANTATION OF HYPOGLOSSAL NERVE STIMULATOR Right 10/01/2021   Procedure: IMPLANTATION OF HYPOGLOSSAL NERVE STIMULATOR;  Surgeon: Melida Quitter, MD;  Location: [place];  Service: ENT;  Laterality: Right;   INGUINAL HERNIA REPAIR  2003   right    KNEE ARTHROSCOPY Right 03/03/2016   LUMBAR DISC SURGERY  03/1995   Dr. Coralyn Mark, discectomy   LUMBAR LAMINECTOMY/DECOMPRESSION MICRODISCECTOMY Right 10/06/2016   Procedure: RIGHT LUMBAR THREE - LUMBAR FOUR  LAMINECTOMY, FORAMINOTOMY AND MICRODISCECTOMY;  Surgeon: Jovita Gamma, MD;  Location: [place];  Service: Neurosurgery;  Laterality: Right;   SHOULDER SURGERY  11/28/2005   left partial   SHOULDER SURGERY  07/14/2006   RIGHT   TONSILLECTOMY  AGE 59 OR 4   TOTAL KNEE ARTHROPLASTY Right 03/07/2022   Procedure: RIGHT TOTAL KNEE ARTHROPLASTY;  Surgeon: Frederik Pear, MD;  [facility];  Service: Orthopedics;  Laterality: Right;   TOTAL SHOULDER ARTHROPLASTY Right 11/01/2018   Procedure: RIGHT SHOULDER  REVISION TO REVERSE TOTAL SHOULDER;  Surgeon: Tania Ade, MD;  [facility];  Service: Orthopedics;  Laterality: Right;  CHOICE ANESTHESIA WITH INTERSCALENE BLOCK EXPAREL, NEEDS RNFA   Patient Active Problem List   Diagnosis Date Noted   Grade I diastolic dysfunction 03/00/9233   Postoperative fever 03/10/2022   S/P TKR (total knee replacement), right 03/07/2022   Osteoarthritis of right knee 03/04/2022   Precordial chest pain 04/20/2021   Former smoker 01/12/2021   PAF (paroxysmal atrial fibrillation) ([place]) 06/22/2020   Hypertension associated with type 2 diabetes mellitus ([place]) 05/17/2019   Hyperlipidemia associated with type 2 diabetes mellitus ([place]) 05/17/2019   GERD with esophagitis 05/17/2019   H/O total shoulder replacement, right 11/01/2018   Paroxysmal tachycardia ([place])  01/11/2017   Nonrheumatic aortic valve stenosis 01/11/2017   Aortic atherosclerosis ([place]) 11/23/2016   HNP (herniated nucleus pulposus), lumbar 10/06/2016   Acute renal injury ([place]) 08/06/2016   Diarrhea 08/06/2016   Fever 08/06/2016   Hyperbilirubinemia 08/06/2016   Lactic acidosis 08/06/2016   Inguinal hernia 12/10/2015   Right groin pain 11/30/2015   Carpal tunnel syndrome on right 09/09/2015   Cervical spondylosis without myelopathy 09/09/2015   Thrombocytopenia ([place]) 07/21/2014   Bilateral carotid bruits 05/11/2014   Vitamin D deficiency 09/17/2013   BPH (benign prostatic hyperplasia) 05/22/2013   Low serum testosterone level 02/18/2013   Diabetes type 2, controlled ([place]) 01/10/2013   OSA (obstructive sleep apnea) 05/16/2012   Edema 02/21/2012   At risk for coronary artery disease 03/20/2011   Obesity 03/20/2011   Allergic rhinitis 06/22/2010   ASTHMA 06/22/2010   COUGH 06/22/2010    PCP: [name], DO  REFERRING PROVIDER: Frederik Pear, MD  REFERRING DIAG: Right total knee arthroplasty  THERAPY DIAG:  Acute pain of right knee  Stiffness of right knee, not elsewhere classified  Rationale for Evaluation and Treatment Rehabilitation  ONSET DATE: 03/07/22  SUBJECTIVE:   SUBJECTIVE STATEMENT: Patient reports that his knee feels alright today. He notes that he did not feel too bad after his last appointment.   PERTINENT HISTORY: HTN, DM, OA, Left TKA scheduled for 05/2022  PAIN:  Are you having pain? Yes: NPRS scale: 2-3/10 Pain location: Right knee Pain description: aching Aggravating factors: none known  Relieving factors: medication, ice  PRECAUTIONS: None  WEIGHT BEARING RESTRICTIONS No  FALLS:  Has patient fallen in last 6 months? No  LIVING ENVIRONMENT: Lives with: lives with their spouse Lives in: House/apartment Stairs: Yes: Internal: 20-25 steps; can reach both and External: 2 steps; can reach both; he is still going "up with the  good and down with the bad"  Has following equipment at home: Single point cane  OCCUPATION: retired  PLOF: Independent  NEXT MD FOLLOW UP: 04/14/22  PATIENT GOALS be able to get outside and take pictures, play golf,    OBJECTIVE: all assessments were performed at his initial evaluation on 03/30/22 unless otherwise noted  PATIENT SURVEYS:  FOTO 28.71  COGNITION:  Overall cognitive status: Within functional limits for tasks assessed     SENSATION: WFL  PALPATION: TTP: right hamstring and gastroc/soleus  LOWER EXTREMITY ROM:  Active ROM Right eval Right PROM eval Left eval  Hip flexion     Hip extension     Hip abduction     Hip adduction     Hip internal rotation     Hip external rotation     Knee flexion 107 113 130  Knee extension 7  0  Ankle  dorsiflexion     Ankle plantarflexion     Ankle inversion     Ankle eversion      (Blank rows = not tested)    GAIT: Assistive device utilized: Single point cane Level of assistance: Modified independence Comments: decreased gait speed, stride length, and weight shift onto RLE    TODAY'S TREATMENT:                                   8/10 EXERCISE LOG  Exercise Repetitions and Resistance Comments  Recumbent bike  L3-6 x 13 minutes   Hamstring stretch (standing) 4 x 30 seconds    Marching on foam 3 minutes   Cybex knee extension 20# x 2.5 minutes   Cybex knee flexion 50# x 2.5 minutes   Tandem balance on foam 4 x 30 seconds each   Rocker board 5 minutes   Step up  6" step x 20 reps     Blank cell = exercise not performed today  Modalities  Date:  Vaso: Knee, 34 degrees, 15 mins, Pain and Edema                                   8/7 EXERCISE LOG  Exercise Repetitions and Resistance Comments  Recumbent bike  L3 x 12 minutes    Rocker board 5 minutes   Step up Onto 6" step x 25 reps   Leg press 1 plate; seat 7;    Cybex knee flexion  50# x 2.5 minutes    Cybex knee extension 10# x 2.5 minutes     Blank  cell = exercise not performed today  Modalities  Date:  Vaso: Knee, 34 degrees, 10 mins, Pain and Edema                                   8/4 EXERCISE LOG  Exercise Repetitions and Resistance Comments  Recumbent bike L2-4 x 10 minutes   Rocker board  4.5 minutes   Lunges  Onto 14" step; 30 reps   Cybex knee flexion  50# x 2 minutes   Cybex knee extension 10# x 2 minutes    Blank cell = exercise not performed today  Modalities  Date:  Vaso: Knee, 34 degrees, 10 mins, Pain and Edema  PATIENT EDUCATION:  Education details: scar massage, signs and symptoms of DVT Person educated: Patient Education method: Explanation Education comprehension: verbalized understanding   HOME EXERCISE PROGRAM:   ASSESSMENT:  CLINICAL IMPRESSION: Patient was progressed with marching on foam in addition to familiar interventions for improved lower extremity and mobility. He required minimal cueing with today's interventions for proper exercise performance. He reported no significant pain or discomfort with any of today's interventions. He reported feeling a little better upon the conclusion of treatment. He continues to require skilled physical therapy to address his remaining impairments to return to his prior level of function.    OBJECTIVE IMPAIRMENTS Abnormal gait, decreased activity tolerance, decreased mobility, difficulty walking, decreased ROM, decreased strength, and pain.   ACTIVITY LIMITATIONS carrying, lifting, bending, standing, squatting, sleeping, stairs, transfers, bed mobility, dressing, and locomotion level  PARTICIPATION LIMITATIONS: cleaning, driving, shopping, community activity, and yard work  PERSONAL FACTORS 3+ comorbidities: HTN, DM, OA   are also affecting patient's functional  outcome.   REHAB POTENTIAL: Excellent  CLINICAL DECISION MAKING: Stable/uncomplicated  EVALUATION COMPLEXITY: Low   GOALS: Goals reviewed with patient? Yes  SHORT TERM GOALS: Target date:  04/20/2022  Patient will be independent with his initial HEP.  Baseline: Goal status: INITIAL  LONG TERM GOALS: Target date: 05/11/2022  Patient will be independent with his advanced HEP.  Baseline:  Goal status: INITIAL  2.  Patient will be able to demonstrate active knee extension within 5 degrees of neutral.  Baseline:  Goal status: INITIAL  3.  Patient will be able to demonstrate at least 120 degree of active right knee flexion for improved function navigating stairs.  Baseline:  Goal status: INITIAL  4.  Patient will be able to navigate at least 4 steps with a reciprocal pattern for improved function with household navigation.  Baseline:  Goal status: INITIAL  PLAN: PT FREQUENCY:  2-3x/ week  PT DURATION: other: 4-6 weeks  PLANNED INTERVENTIONS: Therapeutic exercises, Therapeutic activity, Neuromuscular re-education, Balance training, Gait training, Patient/Family education, Self Care, Joint mobilization, Stair training, Electrical stimulation, Cryotherapy, Moist heat, Vasopneumatic device, Manual therapy, and Re-evaluation  PLAN FOR NEXT SESSION: recumbent bike, SLR, squats, and modalities as needed    Darlin Coco, PT 04/07/2022, 10:10 AM

## 2022-04-08 ENCOUNTER — Ambulatory Visit: Payer: BC Managed Care – PPO | Admitting: Family Medicine

## 2022-04-11 ENCOUNTER — Ambulatory Visit: Payer: BC Managed Care – PPO

## 2022-04-11 DIAGNOSIS — M25661 Stiffness of right knee, not elsewhere classified: Secondary | ICD-10-CM

## 2022-04-11 DIAGNOSIS — M25561 Pain in right knee: Secondary | ICD-10-CM

## 2022-04-11 NOTE — Therapy (Signed)
OUTPATIENT PHYSICAL THERAPY LOWER EXTREMITY TREATMENT   Patient Name: Tony Cantu MRN: [id number] DOB:03-01-1951, 71 y.o., male Today's Date: 04/11/2022   PT End of Session - 04/11/22 0906     Visit Number 5    Number of Visits 12    Date for PT Re-Evaluation 05/13/22    PT Start Time 0900    PT Stop Time 1008    PT Time Calculation (min) 68 min    Activity Tolerance Patient tolerated treatment well    Behavior During Therapy [facility] for tasks assessed/performed                 Past Medical History:  Diagnosis Date   Aortic atherosclerosis ([place])    Aortic stenosis    Arthritis    Asthma    BPH (benign prostatic hypertrophy)    Cirrhosis ([place]) 2016   Colon polyps    Diabetes mellitus without complication ([place])    TYPE 1    Diverticulosis    Dysrhythmia 04/19/2021   A-fib noted with possible A-fib RVR   Gastric ulcer    Gastric varices    right   Gastritis    GERD (gastroesophageal reflux disease)    Headache    History- pt states these were migraines that occurred in the 1980's   Heart murmur    History of kidney stones    Hx of adenomatous colonic polyps    Hypertension    PONV (postoperative nausea and vomiting)    pt states he has never had post op nausea or vomiting   Portal hypertensive gastropathy ([place])    Renal cyst, right    Seizures ([facility])    HYPOGLYCEMIC LAST 1 AND 1/2 YRS AGO   Sleep apnea    uses CPAP nightly   Testicle trouble    one testicle BORN WITH   Tubular adenoma of colon    Past Surgical History:  Procedure Laterality Date   BACK SURGERY     94  LOWER    CARPAL TUNNEL RELEASE Right 10/13/2015   Procedure: RIGHT CARPAL TUNNEL RELEASE;  Surgeon: Daryll Brod, MD;  Location: [place];  Service: Orthopedics;  Laterality: Right;   CARPAL TUNNEL RELEASE Left 07/05/2016   Procedure: LEFT CARPAL TUNNEL RELEASE;  Surgeon: Daryll Brod, MD;  Location: [place];  Service: Orthopedics;  Laterality: Left;    DRUG INDUCED ENDOSCOPY N/A 07/02/2021   Procedure: DRUG INDUCED SLEEP ENDOSCOPY;  Surgeon: Melida Quitter, MD;  Location: [place];  Service: ENT;  Laterality: N/A;   IMPLANTATION OF HYPOGLOSSAL NERVE STIMULATOR Right 10/01/2021   Procedure: IMPLANTATION OF HYPOGLOSSAL NERVE STIMULATOR;  Surgeon: Melida Quitter, MD;  Location: [place];  Service: ENT;  Laterality: Right;   INGUINAL HERNIA REPAIR  2003   right    KNEE ARTHROSCOPY Right 03/03/2016   LUMBAR DISC SURGERY  03/1995   Dr. Coralyn Mark, discectomy   LUMBAR LAMINECTOMY/DECOMPRESSION MICRODISCECTOMY Right 10/06/2016   Procedure: RIGHT LUMBAR THREE - LUMBAR FOUR  LAMINECTOMY, FORAMINOTOMY AND MICRODISCECTOMY;  Surgeon: Jovita Gamma, MD;  Location: [place];  Service: Neurosurgery;  Laterality: Right;   SHOULDER SURGERY  11/28/2005   left partial   SHOULDER SURGERY  07/14/2006   RIGHT   TONSILLECTOMY  AGE 3 OR 4   TOTAL KNEE ARTHROPLASTY Right 03/07/2022   Procedure: RIGHT TOTAL KNEE ARTHROPLASTY;  Surgeon: Frederik Pear, MD;  Location: [facility];  Service: Orthopedics;  Laterality: Right;   TOTAL SHOULDER ARTHROPLASTY Right 11/01/2018   Procedure: RIGHT  SHOULDER REVISION TO REVERSE TOTAL SHOULDER;  Surgeon: Tania Ade, MD;  Location: [facility];  Service: Orthopedics;  Laterality: Right;  CHOICE ANESTHESIA WITH INTERSCALENE BLOCK EXPAREL, NEEDS RNFA   Patient Active Problem List   Diagnosis Date Noted   Grade I diastolic dysfunction 35/36/1443   Postoperative fever 03/10/2022   S/P TKR (total knee replacement), right 03/07/2022   Osteoarthritis of right knee 03/04/2022   Precordial chest pain 04/20/2021   Former smoker 01/12/2021   PAF (paroxysmal atrial fibrillation) ([place]) 06/22/2020   Hypertension associated with type 2 diabetes mellitus ([place]) 05/17/2019   Hyperlipidemia associated with type 2 diabetes mellitus ([place]) 05/17/2019   GERD with esophagitis 05/17/2019   H/O total shoulder replacement, right 11/01/2018   Paroxysmal tachycardia  ([place]) 01/11/2017   Nonrheumatic aortic valve stenosis 01/11/2017   Aortic atherosclerosis ([place]) 11/23/2016   HNP (herniated nucleus pulposus), lumbar 10/06/2016   Acute renal injury ([place]) 08/06/2016   Diarrhea 08/06/2016   Fever 08/06/2016   Hyperbilirubinemia 08/06/2016   Lactic acidosis 08/06/2016   Inguinal hernia 12/10/2015   Right groin pain 11/30/2015   Carpal tunnel syndrome on right 09/09/2015   Cervical spondylosis without myelopathy 09/09/2015   Thrombocytopenia ([place]) 07/21/2014   Bilateral carotid bruits 05/11/2014   Vitamin D deficiency 09/17/2013   BPH (benign prostatic hyperplasia) 05/22/2013   Low serum testosterone level 02/18/2013   Diabetes type 2, controlled ([place]) 01/10/2013   OSA (obstructive sleep apnea) 05/16/2012   Edema 02/21/2012   At risk for coronary artery disease 03/20/2011   Obesity 03/20/2011   Allergic rhinitis 06/22/2010   ASTHMA 06/22/2010   COUGH 06/22/2010    PCP: [name], DO  REFERRING PROVIDER: Frederik Pear, MD  REFERRING DIAG: Right total knee arthroplasty  THERAPY DIAG:  Acute pain of right knee  Stiffness of right knee, not elsewhere classified  Rationale for Evaluation and Treatment Rehabilitation  ONSET DATE: 03/07/22  SUBJECTIVE:   SUBJECTIVE STATEMENT: Patient reports that his knee feels alright today.   PERTINENT HISTORY: HTN, DM, OA, Left TKA scheduled for 05/2022  PAIN:  Are you having pain? Yes: NPRS scale: 0/10 Pain location: Right knee Pain description: aching Aggravating factors: none known  Relieving factors: medication, ice  PRECAUTIONS: None  WEIGHT BEARING RESTRICTIONS No  FALLS:  Has patient fallen in last 6 months? No  LIVING ENVIRONMENT: Lives with: lives with their spouse Lives in: House/apartment Stairs: Yes: Internal: 20-25 steps; can reach both and External: 2 steps; can reach both; he is still going "up with the good and down with the bad"  Has following equipment at home:  Single point cane  OCCUPATION: retired  PLOF: Independent  NEXT MD FOLLOW UP: 04/14/22  PATIENT GOALS be able to get outside and take pictures, play golf,    OBJECTIVE: all assessments were performed at his initial evaluation on 03/30/22 unless otherwise noted  PATIENT SURVEYS:  FOTO 28.71  COGNITION:  Overall cognitive status: Within functional limits for tasks assessed     SENSATION: WFL  PALPATION: TTP: right hamstring and gastroc/soleus  LOWER EXTREMITY ROM:  Active ROM Right eval Right PROM eval Left eval  Hip flexion     Hip extension     Hip abduction     Hip adduction     Hip internal rotation     Hip external rotation     Knee flexion 107 113 130  Knee extension 7  0  Ankle dorsiflexion     Ankle plantarflexion     Ankle  inversion     Ankle eversion      (Blank rows = not tested)    GAIT: Assistive device utilized: Single point cane Level of assistance: Modified independence Comments: decreased gait speed, stride length, and weight shift onto RLE    TODAY'S TREATMENT:                                   8/14 EXERCISE LOG  Exercise Repetitions and Resistance Comments  Recumbent bike L3 x 14 minutes   Cybex knee flexion 60# x 2 minutes    Cybex knee extension 20# x 3 minutes   Leg press Seat 6; plate 1 x 2 minutes progressed to 2 plates for 1 minute   Rocker board  5 minutes   Step down  6" step x 2.5 minutes   Tandem balance on foam 4 x 30 seconds each   Marching on BOSU Ball up: 2 minutes   Sit to stand 2 minutes w/o upper extremity support   Standing hamstring stretch 4 x 30 seconds   SLR  RLE x 20 reps    Low load prolonged stretch for knee extension 8 minutes With zero knee during vaso   Blank cell = exercise not performed today  Modalities  Date:  Vaso: Knee, 34 degrees, 10 mins, Pain and Edema                                   8/10 EXERCISE LOG  Exercise Repetitions and Resistance Comments  Recumbent bike  L3-6 x 13 minutes    Hamstring stretch (standing) 4 x 30 seconds    Marching on foam 3 minutes   Cybex knee extension 20# x 2.5 minutes   Cybex knee flexion 50# x 2.5 minutes   Tandem balance on foam 4 x 30 seconds each   Rocker board 5 minutes   Step up  6" step x 20 reps     Blank cell = exercise not performed today  Modalities  Date:  Vaso: Knee, 34 degrees, 15 mins, Pain and Edema                                   8/7 EXERCISE LOG  Exercise Repetitions and Resistance Comments  Recumbent bike  L3 x 12 minutes    Rocker board 5 minutes   Step up Onto 6" step x 25 reps   Leg press 1 plate; seat 7;    Cybex knee flexion  50# x 2.5 minutes    Cybex knee extension 10# x 2.5 minutes     Blank cell = exercise not performed today  Modalities  Date:  Vaso: Knee, 34 degrees, 10 mins, Pain and Edema   PATIENT EDUCATION:  Education details: scar massage, signs and symptoms of DVT Person educated: Patient Education method: Explanation Education comprehension: verbalized understanding   HOME EXERCISE PROGRAM:   ASSESSMENT:  CLINICAL IMPRESSION: Patient was progressed with multiple new and familiar interventions for improved knee strength and mobility. He required minimal cueing with step down for for improved eccentric quadriceps control. He reported feeling a little stiff and sore upon the conclusion of treatment. He continues to require skilled physical therapy to address his remaining impairments to return to his prior level of function.  OBJECTIVE IMPAIRMENTS Abnormal gait, decreased activity tolerance, decreased mobility, difficulty walking, decreased ROM, decreased strength, and pain.   ACTIVITY LIMITATIONS carrying, lifting, bending, standing, squatting, sleeping, stairs, transfers, bed mobility, dressing, and locomotion level  PARTICIPATION LIMITATIONS: cleaning, driving, shopping, community activity, and yard work  PERSONAL FACTORS 3+ comorbidities: HTN, DM, OA   are also affecting  patient's functional outcome.   REHAB POTENTIAL: Excellent  CLINICAL DECISION MAKING: Stable/uncomplicated  EVALUATION COMPLEXITY: Low   GOALS: Goals reviewed with patient? Yes  SHORT TERM GOALS: Target date: 04/20/2022  Patient will be independent with his initial HEP.  Baseline: Goal status: INITIAL  LONG TERM GOALS: Target date: 05/11/2022  Patient will be independent with his advanced HEP.  Baseline:  Goal status: INITIAL  2.  Patient will be able to demonstrate active knee extension within 5 degrees of neutral.  Baseline:  Goal status: INITIAL  3.  Patient will be able to demonstrate at least 120 degree of active right knee flexion for improved function navigating stairs.  Baseline:  Goal status: INITIAL  4.  Patient will be able to navigate at least 4 steps with a reciprocal pattern for improved function with household navigation.  Baseline:  Goal status: INITIAL  PLAN: PT FREQUENCY:  2-3x/ week  PT DURATION: other: 4-6 weeks  PLANNED INTERVENTIONS: Therapeutic exercises, Therapeutic activity, Neuromuscular re-education, Balance training, Gait training, Patient/Family education, Self Care, Joint mobilization, Stair training, Electrical stimulation, Cryotherapy, Moist heat, Vasopneumatic device, Manual therapy, and Re-evaluation  PLAN FOR NEXT SESSION: recumbent bike, SLR, squats, and modalities as needed    Darlin Coco, PT 04/11/2022, 10:10 AM

## 2022-04-13 ENCOUNTER — Encounter: Payer: Self-pay | Admitting: Family Medicine

## 2022-04-13 ENCOUNTER — Ambulatory Visit (INDEPENDENT_AMBULATORY_CARE_PROVIDER_SITE_OTHER): Payer: BC Managed Care – PPO | Admitting: Family Medicine

## 2022-04-13 VITALS — BP 120/67 | HR 67 | Temp 98.2°F | Ht 68.0 in | Wt 226.8 lb

## 2022-04-13 DIAGNOSIS — E162 Hypoglycemia, unspecified: Secondary | ICD-10-CM

## 2022-04-13 DIAGNOSIS — Z630 Problems in relationship with spouse or partner: Secondary | ICD-10-CM

## 2022-04-13 DIAGNOSIS — Z23 Encounter for immunization: Secondary | ICD-10-CM | POA: Diagnosis not present

## 2022-04-13 DIAGNOSIS — M1712 Unilateral primary osteoarthritis, left knee: Secondary | ICD-10-CM

## 2022-04-13 DIAGNOSIS — Z96651 Presence of right artificial knee joint: Secondary | ICD-10-CM

## 2022-04-13 NOTE — Progress Notes (Signed)
Subjective: CC: Follow-up knee surgery PCP: [name], DO Tony Cantu is a 71 y.o. male presenting to clinic today for:  1.  Right knee surgery Patient is status post right-sided total knee replacement this past July.  He is currently enrolled in outpatient physical therapy treatment.  He is optimistic that he can achieve the 125 degree range of motion that is recommended.  He was told by his physical therapist that he needed to follow-up with his primary care doctor and thus he is here today.  He notes that he did have some rises in blood sugar shortly after surgery and this was suspected to be secondary to a postop infection but they really could never find the etiology of his fevers that he was experiencing.  He is expected to have a left-sided knee replacement in October.  He has had a few hypoglycemic episodes since he advanced his insulin back up to 70 units.  He brings to me his freestyle libre monitor  He goes on to state that he has not felt well supported by his wife after his surgery and that he thinks he will likely go to a rehab center after this next knee surgery.  They have been having a lot more marital stress as of late  ROS: Per HPI  Allergies  Allergen Reactions   Dimetapp Children's Cold-Cough     Does not remember the reaction   Erythromycin Diarrhea   Sulfonamide Derivatives Diarrhea   Past Medical History:  Diagnosis Date   Aortic atherosclerosis ([place])    Aortic stenosis    Arthritis    Asthma    BPH (benign prostatic hypertrophy)    Cirrhosis ([place]) 2016   Colon polyps    Diabetes mellitus without complication ([place])    TYPE 1    Diverticulosis    Dysrhythmia 04/19/2021   A-fib noted with possible A-fib RVR   Gastric ulcer    Gastric varices    right   Gastritis    GERD (gastroesophageal reflux disease)    Headache    History- pt states these were migraines that occurred in the 1980's   Heart murmur    History of kidney stones    Hx  of adenomatous colonic polyps    Hypertension    PONV (postoperative nausea and vomiting)    pt states he has never had post op nausea or vomiting   Portal hypertensive gastropathy ([facility])    Renal cyst, right    Seizures ([facility])    HYPOGLYCEMIC LAST 1 AND 1/2 YRS AGO   Sleep apnea    uses CPAP nightly   Testicle trouble    one testicle BORN WITH   Tubular adenoma of colon     Current Outpatient Medications:    atorvastatin (LIPITOR) 40 MG tablet, TAKE 1 TABLET BY MOUTH DAILY, Disp: 90 tablet, Rfl: 3   b complex vitamins tablet, Take 1 tablet by mouth daily., Disp: , Rfl:    Blood Glucose Monitoring Suppl (CONTOUR NEXT ONE) KIT, Test BS QID and as needed Dx E11.9, Disp: 500 kit, Rfl: 3   Cholecalciferol (VITAMIN D3) 2000 UNITS capsule, Take 2,000 Units by mouth daily., Disp: , Rfl:    Coenzyme Q10 (COQ10) 200 MG CAPS, Take 200 mg by mouth daily., Disp: , Rfl:    Continuous Blood Gluc Receiver (FREESTYLE LIBRE READER) DEVI, 1 applicator by Does not apply route as directed., Disp: 1 Device, Rfl: 1   Continuous Blood Gluc Sensor (FREESTYLE LIBRE  SENSOR SYSTEM) MISC, Check BS eight (8) times a day. Dx E10.9, Disp: 3 each, Rfl: 3   diltiazem (CARDIZEM) 30 MG tablet, TAKE 1 TABLET BY MOUTH  TWICE DAILY (Patient taking differently: Take 30 mg by mouth 2 (two) times daily.), Disp: 180 tablet, Rfl: 3   ELIQUIS 2.5 MG TABS tablet, Take 2.5 mg by mouth 2 (two) times daily., Disp: , Rfl:    furosemide (LASIX) 20 MG tablet, TAKE 1 AND 1/2 TABLETS BY MOUTH  DAILY, Disp: 135 tablet, Rfl: 3   Glucagon, rDNA, (GLUCAGON EMERGENCY) 1 MG KIT, INJECT AS DIRECTED INTO  UPPER ARM, THIGH OR  BUTTOCKS AS NEEDED FOR  SEVERE HYPOGLYCEMIA. SEEK  MEDICAL ATTENTION AFTER USE (Patient taking differently: Inject 1 mg into the muscle as needed (HYPOGLYCEMIA).), Disp: 3 kit, Rfl: 0   Glucosamine-Chondroit-Vit C-Mn (GLUCOSAMINE 1500 COMPLEX) CAPS, Take 2 capsules by mouth daily., Disp: , Rfl:    glucose blood (CONTOUR NEXT  TEST) test strip, Test BS QID and as needed Dx E11.9, Disp: 400 strip, Rfl: 3   insulin glargine, 2 Unit Dial, (TOUJEO MAX SOLOSTAR) 300 UNIT/ML Solostar Pen, Inject 60 Units into the skin daily., Disp: , Rfl:    insulin lispro (HUMALOG) 100 UNIT/ML injection, INJECT SUBCUTANEOUSLY 20 TO 50  UNITS 3 TIMES DAILY BEFORE MEALS PER SLIDING SCALE, Disp: 140 mL, Rfl: 0   MAGNESIUM OXIDE PO, Take 500 mg by mouth daily., Disp: , Rfl:    Microlet Lancets MISC, Test BS QID and as needed Dx E11.9, Disp: 500 each, Rfl: 3   montelukast (SINGULAIR) 10 MG tablet, TAKE 1 TABLET BY MOUTH AT  BEDTIME (Patient taking differently: Take 10 mg by mouth at bedtime.), Disp: 90 tablet, Rfl: 3   multivitamin-lutein (OCUVITE-LUTEIN) CAPS capsule, Take 1 capsule by mouth daily., Disp: , Rfl:    oxyCODONE-acetaminophen (PERCOCET/ROXICET) 5-325 MG tablet, Take 1 tablet by mouth every 4 (four) hours as needed for pain., Disp: , Rfl:    pantoprazole (PROTONIX) 40 MG tablet, Take 1 tablet (40 mg total) by mouth 2 (two) times daily before a meal., Disp: 180 tablet, Rfl: 3   tiZANidine (ZANAFLEX) 2 MG tablet, Take 2 mg by mouth every 6 (six) hours as needed for muscle spasms., Disp: , Rfl:    Turmeric Curcumin 500 MG CAPS, Take 500 mg by mouth daily., Disp: , Rfl:  Social History   Socioeconomic History   Marital status: Married    Spouse name: Not on file   Number of children: 1   Years of education: Not on file   Highest education level: Not on file  Occupational History   Occupation: retired   Tobacco Use   Smoking status: Former    Years: 34.00    Types: Cigarettes, Pipe    Quit date: 2005    Years since quitting: 18.6   Smokeless tobacco: Never   Tobacco comments:    quit 2005 smoked cigarettes for 5 yrs prior to pipe use  Vaping Use   Vaping Use: Never used  Substance and Sexual Activity   Alcohol use: Not Currently   Drug use: No   Sexual activity: Yes  Other Topics Concern   Not on file  Social History  Narrative   ** Merged History Encounter **    Right handed    Social Determinants of Health   Financial Resource Strain: Not on file  Food Insecurity: Not on file  Transportation Needs: Not on file  Physical Activity: Not on file  Stress:  Not on file  Social Connections: Not on file  Intimate Partner Violence: Not on file   Family History  Problem Relation Age of Onset   Heart failure Mother    Heart attack Mother        in her 76's   Alzheimer's disease Mother    Diabetes Mother    Asthma Father    Suicidality Father        in his 46's   Allergic rhinitis Sister    Heart failure Maternal Grandmother    Rheum arthritis Maternal Grandmother    Breast cancer Maternal Grandmother    Heart attack Maternal Grandmother        in her 30's   Colon cancer Neg Hx    Esophageal cancer Neg Hx    Pancreatic cancer Neg Hx    Liver disease Neg Hx     Objective: Office vital signs reviewed. BP 120/67   Pulse 67   Temp 98.2 F (36.8 C)   Ht 5' 8"  (1.727 m)   Wt 226 lb 12.8 oz (102.9 kg)   SpO2 95%   BMI 34.48 kg/m   Physical Examination:  General: Awake, alert, well nourished, No acute distress HEENT: Sclera white.  Moist mucous membranes Cardio: regular rate and rhythm, S1S2 heard, no murmurs appreciated Pulm: Normal work of breathing on room air MSK: Ambulating independently.  Gait is slightly antalgic.  Right knee with a healing postop scar that is vertical.  He does have some ongoing swelling in that right knee and some mild warmth noted to touch as well.      04/13/2022    9:34 AM 01/05/2022    8:00 AM 11/03/2021    8:08 AM  Depression screen PHQ 2/9  Decreased Interest 1 0 0  Down, Depressed, Hopeless 0 0 0  PHQ - 2 Score 1 0 0  Altered sleeping 1 1 1   Tired, decreased energy 1 1 1   Change in appetite 0 0 0  Feeling bad or failure about yourself  0 0 0  Trouble concentrating 0 0 0  Moving slowly or fidgety/restless 0 0 0  Suicidal thoughts 0 0 0  PHQ-9 Score 3 2  2   Difficult doing work/chores Not difficult at all Not difficult at all Not difficult at all      04/13/2022    9:35 AM 01/05/2022    8:00 AM 11/03/2021    8:09 AM 01/19/2021    8:23 AM  GAD 7 : Generalized Anxiety Score  Nervous, Anxious, on Edge 0 0 0 0  Control/stop worrying 0 0 0 0  Worry too much - different things 0 0 0 0  Trouble relaxing 0 0 1 0  Restless 0 0 0 0  Easily annoyed or irritable 0 0 0 0  Afraid - awful might happen 0 0 0 0  Total GAD 7 Score 0 0 1 0  Anxiety Difficulty Not difficult at all Not difficult at all Not difficult at all Not difficult at all      Assessment/ Plan: 71 y.o. male   S/P TKR (total knee replacement), right  Primary osteoarthritis of left knee  Marital stress  Hypoglycemia  Improving with PT.  Continue as directed to improve ROM.  Ambulating independently.  Anticipate left knee surgery in the fall.  If needs A1c, will contact me.  Having ongoing marital stress.  Counseled on this.  Advised to reduce insulin back down so as to avoid hypoglycemic episodes.  Plan for  A1c today but patient declined and wants to hold off until his next appointment.  No orders of the defined types were placed in this encounter.  No orders of the defined types were placed in this encounter.    [name], DO [place] [phone]

## 2022-04-14 ENCOUNTER — Ambulatory Visit: Payer: BC Managed Care – PPO

## 2022-04-14 DIAGNOSIS — M25661 Stiffness of right knee, not elsewhere classified: Secondary | ICD-10-CM | POA: Diagnosis not present

## 2022-04-14 DIAGNOSIS — M25561 Pain in right knee: Secondary | ICD-10-CM | POA: Diagnosis not present

## 2022-04-14 NOTE — Therapy (Signed)
OUTPATIENT PHYSICAL THERAPY LOWER EXTREMITY TREATMENT   Patient Name: Tony Cantu MRN: [id number] DOB:[birth year], 71 y.o., male Today's Date: 04/14/2022   PT End of Session - 04/14/22 0911     Visit Number 6    Number of Visits 12    Date for PT Re-Evaluation 05/13/22    PT Start Time 7425    PT Stop Time 0947    PT Time Calculation (min) 50 min    Activity Tolerance Patient tolerated treatment well    Behavior During Therapy [facility] for tasks assessed/performed                  Past Medical History:  Diagnosis Date   Aortic atherosclerosis ([place])    Aortic stenosis    Arthritis    Asthma    BPH (benign prostatic hypertrophy)    Cirrhosis ([place]) 2016   Colon polyps    Diabetes mellitus without complication ([place])    TYPE 1    Diverticulosis    Dysrhythmia 04/19/2021   A-fib noted with possible A-fib RVR   Gastric ulcer    Gastric varices    right   Gastritis    GERD (gastroesophageal reflux disease)    Headache    History- pt states these were migraines that occurred in the 1980's   Heart murmur    History of kidney stones    Hx of adenomatous colonic polyps    Hypertension    PONV (postoperative nausea and vomiting)    pt states he has never had post op nausea or vomiting   Portal hypertensive gastropathy ([place])    Renal cyst, right    Seizures ([facility])    HYPOGLYCEMIC LAST 1 AND 1/2 YRS AGO   Sleep apnea    uses CPAP nightly   Testicle trouble    one testicle BORN WITH   Tubular adenoma of colon    Past Surgical History:  Procedure Laterality Date   BACK SURGERY     94  LOWER    CARPAL TUNNEL RELEASE Right 10/13/2015   Procedure: RIGHT CARPAL TUNNEL RELEASE;  Surgeon: Daryll Brod, MD;  Location: [place];  Service: Orthopedics;  Laterality: Right;   CARPAL TUNNEL RELEASE Left 07/05/2016   Procedure: LEFT CARPAL TUNNEL RELEASE;  Surgeon: Daryll Brod, MD;  Location: [place];  Service: Orthopedics;  Laterality: Left;    DRUG INDUCED ENDOSCOPY N/A 07/02/2021   Procedure: DRUG INDUCED SLEEP ENDOSCOPY;  Surgeon: Melida Quitter, MD;  Location: [place];  Service: ENT;  Laterality: N/A;   IMPLANTATION OF HYPOGLOSSAL NERVE STIMULATOR Right 10/01/2021   Procedure: IMPLANTATION OF HYPOGLOSSAL NERVE STIMULATOR;  Surgeon: Melida Quitter, MD;  Location: [place];  Service: ENT;  Laterality: Right;   INGUINAL HERNIA REPAIR  2003   right    KNEE ARTHROSCOPY Right 03/03/2016   LUMBAR DISC SURGERY  03/1995   Dr. Coralyn Mark, discectomy   LUMBAR LAMINECTOMY/DECOMPRESSION MICRODISCECTOMY Right 10/06/2016   Procedure: RIGHT LUMBAR THREE - LUMBAR FOUR  LAMINECTOMY, FORAMINOTOMY AND MICRODISCECTOMY;  Surgeon: Jovita Gamma, MD;  Location: [place];  Service: Neurosurgery;  Laterality: Right;   SHOULDER SURGERY  11/28/2005   left partial   SHOULDER SURGERY  07/14/2006   RIGHT   TONSILLECTOMY  AGE 49 OR 4   TOTAL KNEE ARTHROPLASTY Right 03/07/2022   Procedure: RIGHT TOTAL KNEE ARTHROPLASTY;  Surgeon: Frederik Pear, MD;  [facility];  Service: Orthopedics;  Laterality: Right;   TOTAL SHOULDER ARTHROPLASTY Right 11/01/2018   Procedure:  RIGHT SHOULDER REVISION TO REVERSE TOTAL SHOULDER;  Surgeon: Tania Ade, MD;  [facility];  Service: Orthopedics;  Laterality: Right;  CHOICE ANESTHESIA WITH INTERSCALENE BLOCK EXPAREL, NEEDS RNFA   Patient Active Problem List   Diagnosis Date Noted   Grade I diastolic dysfunction 97/41/6384   Postoperative fever 03/10/2022   S/P TKR (total knee replacement), right 03/07/2022   Osteoarthritis of right knee 03/04/2022   Precordial chest pain 04/20/2021   Former smoker 01/12/2021   PAF (paroxysmal atrial fibrillation) ([place]) 06/22/2020   Hypertension associated with type 2 diabetes mellitus ([place]) 05/17/2019   Hyperlipidemia associated with type 2 diabetes mellitus ([place]) 05/17/2019   GERD with esophagitis 05/17/2019   H/O total shoulder replacement, right 11/01/2018   Paroxysmal tachycardia  ([place]) 01/11/2017   Nonrheumatic aortic valve stenosis 01/11/2017   Aortic atherosclerosis ([place]) 11/23/2016   HNP (herniated nucleus pulposus), lumbar 10/06/2016   Acute renal injury ([place]) 08/06/2016   Diarrhea 08/06/2016   Fever 08/06/2016   Hyperbilirubinemia 08/06/2016   Lactic acidosis 08/06/2016   Inguinal hernia 12/10/2015   Right groin pain 11/30/2015   Carpal tunnel syndrome on right 09/09/2015   Cervical spondylosis without myelopathy 09/09/2015   Thrombocytopenia ([place]) 07/21/2014   Bilateral carotid bruits 05/11/2014   Vitamin D deficiency 09/17/2013   BPH (benign prostatic hyperplasia) 05/22/2013   Low serum testosterone level 02/18/2013   Diabetes type 2, controlled ([place]) 01/10/2013   OSA (obstructive sleep apnea) 05/16/2012   Edema 02/21/2012   At risk for coronary artery disease 03/20/2011   Obesity 03/20/2011   Allergic rhinitis 06/22/2010   ASTHMA 06/22/2010   COUGH 06/22/2010    PCP: [name], DO  REFERRING PROVIDER: Frederik Pear, MD  REFERRING DIAG: Right total knee arthroplasty  THERAPY DIAG:  Acute pain of right knee  Stiffness of right knee, not elsewhere classified  Rationale for Evaluation and Treatment Rehabilitation  ONSET DATE: 03/07/22  SUBJECTIVE:   SUBJECTIVE STATEMENT: Patient reports that his knee is hurting more today. He notes that it has been bothering him a little more since Tuesday.   PERTINENT HISTORY: HTN, DM, OA, Left TKA scheduled for 05/2022  PAIN:  Are you having pain? Yes: NPRS scale: 3/10 Pain location: Right knee Pain description: aching Aggravating factors: none known  Relieving factors: medication, ice  PRECAUTIONS: None  WEIGHT BEARING RESTRICTIONS No  FALLS:  Has patient fallen in last 6 months? No  LIVING ENVIRONMENT: Lives with: lives with their spouse Lives in: House/apartment Stairs: Yes: Internal: 20-25 steps; can reach both and External: 2 steps; can reach both; he is still going "up  with the good and down with the bad"  Has following equipment at home: Single point cane  OCCUPATION: retired  PLOF: Independent  NEXT MD FOLLOW UP: 04/14/22  PATIENT GOALS be able to get outside and take pictures, play golf,    OBJECTIVE: all assessments were performed at his initial evaluation on 03/30/22 unless otherwise noted  PATIENT SURVEYS:  FOTO 28.71  COGNITION:  Overall cognitive status: Within functional limits for tasks assessed     SENSATION: WFL  PALPATION: TTP: right hamstring and gastroc/soleus  LOWER EXTREMITY ROM:  Active ROM Right eval Right PROM eval Right AROM 8/17 Right PROM 8/17  Hip flexion      Hip extension      Hip abduction      Hip adduction      Hip internal rotation      Hip external rotation  Knee flexion 107 113 113 123  Knee extension 7  5   Ankle dorsiflexion      Ankle plantarflexion      Ankle inversion      Ankle eversion       (Blank rows = not tested)    GAIT: Assistive device utilized: Single point cane Level of assistance: Modified independence Comments: decreased gait speed, stride length, and weight shift onto RLE    TODAY'S TREATMENT:                                   8/17 EXERCISE LOG  Exercise Repetitions and Resistance Comments  Recumbent bike L3-5 x 15 minutes   Rocker board 3.5 minutes   R hamstring stretch 4 x 30 second hold    SLR  20 reps   Bridge 20 reps    Low load prolonged stretch for knee extension 4 minutes With zero knee during vaso   Blank cell = exercise not performed today  Modalities  Date:  Vaso: Knee, 34 degrees, 10 mins, Pain and Edema                                   8/14 EXERCISE LOG  Exercise Repetitions and Resistance Comments  Recumbent bike L3 x 14 minutes   Cybex knee flexion 60# x 2 minutes    Cybex knee extension 20# x 3 minutes   Leg press Seat 6; plate 1 x 2 minutes progressed to 2 plates for 1 minute   Rocker board  5 minutes   Step down  6" step x 2.5 minutes    Tandem balance on foam 4 x 30 seconds each   Marching on BOSU Ball up: 2 minutes   Sit to stand 2 minutes w/o upper extremity support   Standing hamstring stretch 4 x 30 seconds   SLR  RLE x 20 reps    Low load prolonged stretch for knee extension 8 minutes With zero knee during vaso   Blank cell = exercise not performed today  Modalities  Date:  Vaso: Knee, 34 degrees, 10 mins, Pain and Edema                                   8/10 EXERCISE LOG  Exercise Repetitions and Resistance Comments  Recumbent bike  L3-6 x 13 minutes   Hamstring stretch (standing) 4 x 30 seconds    Marching on foam 3 minutes   Cybex knee extension 20# x 2.5 minutes   Cybex knee flexion 50# x 2.5 minutes   Tandem balance on foam 4 x 30 seconds each   Rocker board 5 minutes   Step up  6" step x 20 reps     Blank cell = exercise not performed today  Modalities  Date:  Vaso: Knee, 34 degrees, 15 mins, Pain and Edema   PATIENT EDUCATION:  Education details: scar massage, signs and symptoms of DVT Person educated: Patient Education method: Explanation Education comprehension: verbalized understanding   HOME EXERCISE PROGRAM:   ASSESSMENT:  CLINICAL IMPRESSION: Patient is making good progress with skilled physical therapy as evidenced by his improved objective measures and progress toward his goals. He continues to experienced increased difficulty with functional activities. Treatment focused on familiar interventions for improved  knee mobility. He reported no significant pain or discomfort with any of today's interventions. He reported feeling better upon the conclusion of treatment. He continues to require skilled physical therapy to address his remaining impairments to return to his prior level of function.    OBJECTIVE IMPAIRMENTS Abnormal gait, decreased activity tolerance, decreased mobility, difficulty walking, decreased ROM, decreased strength, and pain.   ACTIVITY LIMITATIONS carrying,  lifting, bending, standing, squatting, sleeping, stairs, transfers, bed mobility, dressing, and locomotion level  PARTICIPATION LIMITATIONS: cleaning, driving, shopping, community activity, and yard work  PERSONAL FACTORS 3+ comorbidities: HTN, DM, OA   are also affecting patient's functional outcome.   REHAB POTENTIAL: Excellent  CLINICAL DECISION MAKING: Stable/uncomplicated  EVALUATION COMPLEXITY: Low   GOALS: Goals reviewed with patient? Yes  SHORT TERM GOALS: Target date: 04/20/2022  Patient will be independent with his initial HEP.  Baseline: Goal status: MET  LONG TERM GOALS: Target date: 05/11/2022  Patient will be independent with his advanced HEP.  Baseline:  Goal status: IN PROGRESS  2.  Patient will be able to demonstrate active knee extension within 5 degrees of neutral.  Baseline:  Goal status: MET  3.  Patient will be able to demonstrate at least 120 degree of active right knee flexion for improved function navigating stairs.  Baseline:  Goal status: IN PROGRESS  4.  Patient will be able to navigate at least 4 steps with a reciprocal pattern for improved function with household navigation.  Baseline:  Goal status: IN PROGRESS  PLAN: PT FREQUENCY:  2-3x/ week  PT DURATION: other: 4-6 weeks  PLANNED INTERVENTIONS: Therapeutic exercises, Therapeutic activity, Neuromuscular re-education, Balance training, Gait training, Patient/Family education, Self Care, Joint mobilization, Stair training, Electrical stimulation, Cryotherapy, Moist heat, Vasopneumatic device, Manual therapy, and Re-evaluation  PLAN FOR NEXT SESSION: recumbent bike, SLR, squats, and modalities as needed    Darlin Coco, PT 04/14/2022, 10:07 AM

## 2022-04-15 ENCOUNTER — Encounter: Payer: Self-pay | Admitting: Family Medicine

## 2022-04-18 ENCOUNTER — Ambulatory Visit: Payer: BC Managed Care – PPO | Admitting: Cardiology

## 2022-04-18 ENCOUNTER — Ambulatory Visit: Payer: BC Managed Care – PPO

## 2022-04-18 DIAGNOSIS — M25561 Pain in right knee: Secondary | ICD-10-CM | POA: Diagnosis not present

## 2022-04-18 DIAGNOSIS — M25661 Stiffness of right knee, not elsewhere classified: Secondary | ICD-10-CM

## 2022-04-18 NOTE — Therapy (Signed)
OUTPATIENT PHYSICAL THERAPY LOWER EXTREMITY TREATMENT   Patient Name: Tony Cantu MRN: [id number] DOB:07-01-1951, 71 y.o., male Today's Date: 04/18/2022   PT End of Session - 04/18/22 0900     Visit Number 7    Number of Visits 12    Date for PT Re-Evaluation 05/13/22    PT Start Time 0900    PT Stop Time 4098    PT Time Calculation (min) 59 min    Activity Tolerance Patient tolerated treatment well    Behavior During Therapy [facility] for tasks assessed/performed                  Past Medical History:  Diagnosis Date   Aortic atherosclerosis ([place])    Aortic stenosis    Arthritis    Asthma    BPH (benign prostatic hypertrophy)    Cirrhosis ([place]) 2016   Colon polyps    Diabetes mellitus without complication ([place])    TYPE 1    Diverticulosis    Dysrhythmia 04/19/2021   A-fib noted with possible A-fib RVR   Gastric ulcer    Gastric varices    right   Gastritis    GERD (gastroesophageal reflux disease)    Headache    History- pt states these were migraines that occurred in the 1980's   Heart murmur    History of kidney stones    Hx of adenomatous colonic polyps    Hypertension    PONV (postoperative nausea and vomiting)    pt states he has never had post op nausea or vomiting   Portal hypertensive gastropathy ([facility])    Renal cyst, right    Seizures ([facility])    HYPOGLYCEMIC LAST 1 AND 1/2 YRS AGO   Sleep apnea    uses CPAP nightly   Testicle trouble    one testicle BORN WITH   Tubular adenoma of colon    Past Surgical History:  Procedure Laterality Date   BACK SURGERY     94  LOWER    CARPAL TUNNEL RELEASE Right 10/13/2015   Procedure: RIGHT CARPAL TUNNEL RELEASE;  Surgeon: Daryll Brod, MD;  Location: [place];  Service: Orthopedics;  Laterality: Right;   CARPAL TUNNEL RELEASE Left 07/05/2016   Procedure: LEFT CARPAL TUNNEL RELEASE;  Surgeon: Daryll Brod, MD;  Location: [place];  Service: Orthopedics;  Laterality: Left;    DRUG INDUCED ENDOSCOPY N/A 07/02/2021   Procedure: DRUG INDUCED SLEEP ENDOSCOPY;  Surgeon: Melida Quitter, MD;  Location: [place];  Service: ENT;  Laterality: N/A;   IMPLANTATION OF HYPOGLOSSAL NERVE STIMULATOR Right 10/01/2021   Procedure: IMPLANTATION OF HYPOGLOSSAL NERVE STIMULATOR;  Surgeon: Melida Quitter, MD;  Location: [place];  Service: ENT;  Laterality: Right;   INGUINAL HERNIA REPAIR  2003   right    KNEE ARTHROSCOPY Right 03/03/2016   LUMBAR DISC SURGERY  03/1995   Dr. Coralyn Mark, discectomy   LUMBAR LAMINECTOMY/DECOMPRESSION MICRODISCECTOMY Right 10/06/2016   Procedure: RIGHT LUMBAR THREE - LUMBAR FOUR  LAMINECTOMY, FORAMINOTOMY AND MICRODISCECTOMY;  Surgeon: Jovita Gamma, MD;  Location: [place];  Service: Neurosurgery;  Laterality: Right;   SHOULDER SURGERY  11/28/2005   left partial   SHOULDER SURGERY  07/14/2006   RIGHT   TONSILLECTOMY  AGE 50 OR 4   TOTAL KNEE ARTHROPLASTY Right 03/07/2022   Procedure: RIGHT TOTAL KNEE ARTHROPLASTY;  Surgeon: Frederik Pear, MD;  [facility];  Service: Orthopedics;  Laterality: Right;   TOTAL SHOULDER ARTHROPLASTY Right 11/01/2018   Procedure:  RIGHT SHOULDER REVISION TO REVERSE TOTAL SHOULDER;  Surgeon: Tania Ade, MD;  [facility];  Service: Orthopedics;  Laterality: Right;  CHOICE ANESTHESIA WITH INTERSCALENE BLOCK EXPAREL, NEEDS RNFA   Patient Active Problem List   Diagnosis Date Noted   Grade I diastolic dysfunction 77/10/4033   Postoperative fever 03/10/2022   S/P TKR (total knee replacement), right 03/07/2022   Osteoarthritis of right knee 03/04/2022   Precordial chest pain 04/20/2021   Former smoker 01/12/2021   PAF (paroxysmal atrial fibrillation) ([place]) 06/22/2020   Hypertension associated with type 2 diabetes mellitus ([place]) 05/17/2019   Hyperlipidemia associated with type 2 diabetes mellitus ([place]) 05/17/2019   GERD with esophagitis 05/17/2019   H/O total shoulder replacement, right 11/01/2018   Paroxysmal tachycardia  ([place]) 01/11/2017   Nonrheumatic aortic valve stenosis 01/11/2017   Aortic atherosclerosis ([place]) 11/23/2016   HNP (herniated nucleus pulposus), lumbar 10/06/2016   Acute renal injury ([place]) 08/06/2016   Diarrhea 08/06/2016   Fever 08/06/2016   Hyperbilirubinemia 08/06/2016   Lactic acidosis 08/06/2016   Inguinal hernia 12/10/2015   Right groin pain 11/30/2015   Carpal tunnel syndrome on right 09/09/2015   Cervical spondylosis without myelopathy 09/09/2015   Thrombocytopenia ([place]) 07/21/2014   Bilateral carotid bruits 05/11/2014   Vitamin D deficiency 09/17/2013   BPH (benign prostatic hyperplasia) 05/22/2013   Low serum testosterone level 02/18/2013   Diabetes type 2, controlled ([place]) 01/10/2013   OSA (obstructive sleep apnea) 05/16/2012   Edema 02/21/2012   At risk for coronary artery disease 03/20/2011   Obesity 03/20/2011   Allergic rhinitis 06/22/2010   ASTHMA 06/22/2010   COUGH 06/22/2010    PCP: [name], DO  REFERRING PROVIDER: Frederik Pear, MD  REFERRING DIAG: Right total knee arthroplasty  THERAPY DIAG:  Acute pain of right knee  Stiffness of right knee, not elsewhere classified  Rationale for Evaluation and Treatment Rehabilitation  ONSET DATE: 03/07/22  SUBJECTIVE:   SUBJECTIVE STATEMENT: Patient reports that he was cleared by his physician and he does not need to follow up with them again. He reported feeling comfortable being discharged today.   PERTINENT HISTORY: HTN, DM, OA, Left TKA scheduled for 05/2022  PAIN:  Are you having pain? Yes: NPRS scale: 2-3/10 Pain location: Right knee Pain description: aching Aggravating factors: none known  Relieving factors: medication, ice  PRECAUTIONS: None  WEIGHT BEARING RESTRICTIONS No  FALLS:  Has patient fallen in last 6 months? No  LIVING ENVIRONMENT: Lives with: lives with their spouse Lives in: House/apartment Stairs: Yes: Internal: 20-25 steps; can reach both and External: 2 steps;  can reach both; he is still going "up with the good and down with the bad"  Has following equipment at home: Single point cane  OCCUPATION: retired  PLOF: Independent  NEXT MD FOLLOW UP: 04/14/22  PATIENT GOALS be able to get outside and take pictures, play golf,    OBJECTIVE: all assessments were performed at his initial evaluation on 03/30/22 unless otherwise noted  PATIENT SURVEYS:  FOTO 58.85  COGNITION:  Overall cognitive status: Within functional limits for tasks assessed     SENSATION: WFL  PALPATION: TTP: right hamstring and gastroc/soleus  LOWER EXTREMITY ROM:  Active ROM Right eval Right PROM eval Right AROM 8/17 Right PROM 8/17 Right AROM 8/21  Right PROM 8/21  Hip flexion        Hip extension        Hip abduction        Hip adduction  Hip internal rotation        Hip external rotation        Knee flexion 107 113 113 123 122 123  Knee extension _0 Ankle dorsiflexion        Ankle plantarflexion        Ankle inversion        Ankle eversion         (Blank rows = not tested)    GAIT: Assistive device utilized: Single point cane Level of assistance: Modified independence Comments: decreased gait speed, stride length, and weight shift onto RLE    TODAY'S TREATMENT:                                   8/21 EXERCISE LOG  Exercise Repetitions and Resistance Comments  Recumbent bike L3 x 15 minutes    Rocker board  5 minutes   Gastroc stretch 3 x 30 seconds   Hamstring stretch 3 x 30 seconds   Sit to stand 10 reps w/o UE support    Blank cell = exercise not performed today  Modalities  Date:  Vaso: Knee, 34 degrees, 15 mins, Pain and Edema                                   8/17 EXERCISE LOG  Exercise Repetitions and Resistance Comments  Recumbent bike L3-5 x 15 minutes   Rocker board 3.5 minutes   R hamstring stretch 4 x 30 second hold    SLR  20 reps   Bridge 20 reps    Low load prolonged stretch for knee extension 4 minutes With  zero knee during vaso   Blank cell = exercise not performed today  Modalities  Date:  Vaso: Knee, 34 degrees, 10 mins, Pain and Edema                                   8/14 EXERCISE LOG  Exercise Repetitions and Resistance Comments  Recumbent bike L3 x 14 minutes   Cybex knee flexion 60# x 2 minutes    Cybex knee extension 20# x 3 minutes   Leg press Seat 6; plate 1 x 2 minutes progressed to 2 plates for 1 minute   Rocker board  5 minutes   Step down  6" step x 2.5 minutes   Tandem balance on foam 4 x 30 seconds each   Marching on BOSU Ball up: 2 minutes   Sit to stand 2 minutes w/o upper extremity support   Standing hamstring stretch 4 x 30 seconds   SLR  RLE x 20 reps    Low load prolonged stretch for knee extension 8 minutes With zero knee during vaso   Blank cell = exercise not performed today  Modalities  Date:  Vaso: Knee, 34 degrees, 10 mins, Pain and Edema   PATIENT EDUCATION:  Education details: healing, progress with therapy, HEP Person educated: Patient Education method: Explanation Education comprehension: verbalized understanding   HOME EXERCISE PROGRAM:   ASSESSMENT:  CLINICAL IMPRESSION: Patient has made excellent progress with skilled physical therapy as evidenced by his subjective reports, objective measurements, and functional mobility. He was able to meet most of his goals for physical therapy. However, he was unable to  navigate stairs with a reciprocal pattern. His HEP was updated and he was able to properly demonstrate these interventions. He reported feeling comfortable being discharged at this time.    OBJECTIVE IMPAIRMENTS Abnormal gait, decreased activity tolerance, decreased mobility, difficulty walking, decreased ROM, decreased strength, and pain.   ACTIVITY LIMITATIONS carrying, lifting, bending, standing, squatting, sleeping, stairs, transfers, bed mobility, dressing, and locomotion level  PARTICIPATION LIMITATIONS: cleaning, driving,  shopping, community activity, and yard work  PERSONAL FACTORS 3+ comorbidities: HTN, DM, OA   are also affecting patient's functional outcome.   REHAB POTENTIAL: Excellent  CLINICAL DECISION MAKING: Stable/uncomplicated  EVALUATION COMPLEXITY: Low   GOALS: Goals reviewed with patient? Yes  SHORT TERM GOALS: Target date: 04/20/2022  Patient will be independent with his initial HEP.  Baseline: Goal status: MET  LONG TERM GOALS: Target date: 05/11/2022  Patient will be independent with his advanced HEP.  Baseline:  Goal status: MET  2.  Patient will be able to demonstrate active knee extension within 5 degrees of neutral.  Baseline:  Goal status: MET  3.  Patient will be able to demonstrate at least 120 degree of active right knee flexion for improved function navigating stairs.  Baseline:  Goal status: MET  4.  Patient will be able to navigate at least 4 steps with a reciprocal pattern for improved function with household navigation.  Baseline: step to pattern Goal status: NOT MET  PLAN: PT FREQUENCY:  2-3x/ week  PT DURATION: other: 4-6 weeks  PLANNED INTERVENTIONS: Therapeutic exercises, Therapeutic activity, Neuromuscular re-education, Balance training, Gait training, Patient/Family education, Self Care, Joint mobilization, Stair training, Electrical stimulation, Cryotherapy, Moist heat, Vasopneumatic device, Manual therapy, and Re-evaluation  PLAN FOR NEXT SESSION: recumbent bike, SLR, squats, and modalities as needed   PHYSICAL THERAPY DISCHARGE SUMMARY  Visits from Start of Care: 7  Current functional level related to goals / functional outcomes: Patient was able to meet most of his goals for physical therapy. He was able to meet all of his goals excluding navigating stairs with a reciprocal pattern.    Remaining deficits: None    Education / Equipment: HEP   Patient agrees to discharge. Patient goals were partially met. Patient is being discharged due to  being pleased with the current functional level.   Darlin Coco, PT 04/18/2022, 9:59 AM

## 2022-04-27 ENCOUNTER — Ambulatory Visit (INDEPENDENT_AMBULATORY_CARE_PROVIDER_SITE_OTHER): Payer: BC Managed Care – PPO | Admitting: Adult Health

## 2022-04-27 ENCOUNTER — Encounter: Payer: Self-pay | Admitting: Adult Health

## 2022-04-27 DIAGNOSIS — G4733 Obstructive sleep apnea (adult) (pediatric): Secondary | ICD-10-CM

## 2022-04-27 NOTE — Assessment & Plan Note (Signed)
OSA -CPAP intolerant s/p Inspire device 09/2021.  Doing very well. Not as comfortable on higher level. Will cut back to Inspire Level 9 , Amp 1.3 V  Go for inspire titration next month as planned   Plan  Patient Instructions  Continue on with Inspire device At bedtime   Plan for Inspire titration study next month.  Healthy sleep regimen  Follow up with Dr. Halford Chessman  as planned next month and As needed

## 2022-04-27 NOTE — Patient Instructions (Addendum)
Continue on with Inspire device At bedtime   Plan for Inspire titration study next month.  Healthy sleep regimen  Follow up with Dr. Halford Chessman  as planned next month and As needed

## 2022-04-27 NOTE — Progress Notes (Signed)
@Patient  ID: Tony Cantu, male    DOB: 1951/07/27, 71 y.o.   MRN: [id number]  Chief Complaint  Patient presents with   Follow-up    Referring provider: [name], DO  HPI: 71 year old male followed for obstructive sleep apnea.  CPAP intolerant.  Underwent inspire device implantation October 01, 2021  TEST/EVENTS :  PSG 05/02/12 >> AHI 76 Home sleep study 04/07/21 >> AHI 49.3, SpO2 low 79%  04/27/2022 Follow up : OSA -inspire device Patient presents for a 4-monthfollow-up.  Patient has underlying obstructive sleep apnea with CPAP intolerance.  He underwent the inspire device implantation October 01, 2021.  Patient says he has been doing well with his inspire.  Inspire compliance shows 100% compliance with average usage at 8hrs. Currently on level 11 . Patient feels that it has been uncomfortable since getting to this level . Has been more restless, waking up every couple of hours.  Has inspire titration next month planned.  Inspire Simulation test in office today with level 9 at 1.3 V - more comfortable for patient . Change to 30 min start time.   Had recent right  knee surgery and is doing well. Plans for left knee surgery in October.   Patient is color blind.   Allergies  Allergen Reactions   Dimetapp Children's Cold-Cough     Does not remember the reaction   Erythromycin Diarrhea   Sulfonamide Derivatives Diarrhea    Immunization History  Administered Date(s) Administered   Fluad Quad(high Dose 65+) 05/17/2019, 05/19/2020, 07/06/2021   Influenza Split 06/05/2013   Influenza Whole 06/15/2010, 06/16/2011, 05/29/2012   Influenza, High Dose Seasonal PF 06/25/2015, 06/25/2015, 06/12/2017   Influenza,inj,Quad PF,6+ Mos 06/05/2013, 06/17/2014, 06/07/2016   Influenza-Unspecified 07/04/2008, 06/17/2014, 06/25/2015, 06/12/2017, 05/22/2018   Moderna SARS-COV2 Booster Vaccination 08/26/2020   Moderna Sars-Covid-2 Vaccination 11/07/2019, 12/05/2019, 08/26/2020    Pneumococcal Conjugate-13 06/05/2013   Pneumococcal Polysaccharide-23 06/03/2011, 07/04/2011, 09/16/2019   Tdap 08/30/2007, 12/27/2017   Zoster Recombinat (Shingrix) 01/05/2022, 04/13/2022   Zoster, Live 02/01/2012    Past Medical History:  Diagnosis Date   Aortic atherosclerosis ([facility]    Aortic stenosis    Arthritis    Asthma    BPH (benign prostatic hypertrophy)    Cirrhosis ([facility] 2016   Colon polyps    Diabetes mellitus without complication ([facility]    TYPE 1    Diverticulosis    Dysrhythmia 04/19/2021   A-fib noted with possible A-fib RVR   Gastric ulcer    Gastric varices    right   Gastritis    GERD (gastroesophageal reflux disease)    Headache    History- pt states these were migraines that occurred in the 1980's   Heart murmur    History of kidney stones    Hx of adenomatous colonic polyps    Hypertension    PONV (postoperative nausea and vomiting)    pt states he has never had post op nausea or vomiting   Portal hypertensive gastropathy (HCC)    Renal cyst, right    Seizures (HCC)    HYPOGLYCEMIC LAST 1 AND 1/2 YRS AGO   Sleep apnea    uses CPAP nightly   Testicle trouble    one testicle BORN WITH   Tubular adenoma of colon     Tobacco History: Social History   Tobacco Use  Smoking Status Former   Years: 34.00   Types: Cigarettes, Pipe   Quit date: 2005   Years since quitting: 18.6  Smokeless Tobacco  Never  Tobacco Comments   quit 2005 smoked cigarettes for 5 yrs prior to pipe use   Counseling given: Not Answered Tobacco comments: quit 2005 smoked cigarettes for 5 yrs prior to pipe use   Outpatient Medications Prior to Visit  Medication Sig Dispense Refill   atorvastatin (LIPITOR) 40 MG tablet TAKE 1 TABLET BY MOUTH DAILY 90 tablet 3   b complex vitamins tablet Take 1 tablet by mouth daily.     Blood Glucose Monitoring Suppl (CONTOUR NEXT ONE) KIT Test BS QID and as needed Dx E11.9 500 kit 3   Cholecalciferol (VITAMIN D3) 2000 UNITS capsule  Take 2,000 Units by mouth daily.     Coenzyme Q10 (COQ10) 200 MG CAPS Take 200 mg by mouth daily.     Continuous Blood Gluc Receiver (FREESTYLE LIBRE READER) DEVI 1 applicator by Does not apply route as directed. 1 Device 1   Continuous Blood Gluc Sensor (FREESTYLE LIBRE SENSOR SYSTEM) MISC Check BS eight (8) times a day. Dx E10.9 3 each 3   diltiazem (CARDIZEM) 30 MG tablet TAKE 1 TABLET BY MOUTH  TWICE DAILY (Patient taking differently: Take 30 mg by mouth 2 (two) times daily.) 180 tablet 3   ELIQUIS 2.5 MG TABS tablet Take 2.5 mg by mouth 2 (two) times daily.     furosemide (LASIX) 20 MG tablet TAKE 1 AND 1/2 TABLETS BY MOUTH  DAILY 135 tablet 3   Glucagon, rDNA, (GLUCAGON EMERGENCY) 1 MG KIT INJECT AS DIRECTED INTO  UPPER ARM, THIGH OR  BUTTOCKS AS NEEDED FOR  SEVERE HYPOGLYCEMIA. SEEK  MEDICAL ATTENTION AFTER USE (Patient taking differently: Inject 1 mg into the muscle as needed (HYPOGLYCEMIA).) 3 kit 0   Glucosamine-Chondroit-Vit C-Mn (GLUCOSAMINE 1500 COMPLEX) CAPS Take 2 capsules by mouth daily.     glucose blood (CONTOUR NEXT TEST) test strip Test BS QID and as needed Dx E11.9 400 strip 3   insulin glargine, 2 Unit Dial, (TOUJEO MAX SOLOSTAR) 300 UNIT/ML Solostar Pen Inject 60 Units into the skin daily.     insulin lispro (HUMALOG) 100 UNIT/ML injection INJECT SUBCUTANEOUSLY 20 TO 50  UNITS 3 TIMES DAILY BEFORE MEALS PER SLIDING SCALE 140 mL 0   MAGNESIUM OXIDE PO Take 500 mg by mouth daily.     Microlet Lancets MISC Test BS QID and as needed Dx E11.9 500 each 3   montelukast (SINGULAIR) 10 MG tablet TAKE 1 TABLET BY MOUTH AT  BEDTIME (Patient taking differently: Take 10 mg by mouth at bedtime.) 90 tablet 3   multivitamin-lutein (OCUVITE-LUTEIN) CAPS capsule Take 1 capsule by mouth daily.     oxyCODONE-acetaminophen (PERCOCET/ROXICET) 5-325 MG tablet Take 1 tablet by mouth every 4 (four) hours as needed for pain.     pantoprazole (PROTONIX) 40 MG tablet Take 1 tablet (40 mg total) by mouth  2 (two) times daily before a meal. 180 tablet 3   tiZANidine (ZANAFLEX) 2 MG tablet Take 2 mg by mouth every 6 (six) hours as needed for muscle spasms.     Turmeric Curcumin 500 MG CAPS Take 500 mg by mouth daily.     No facility-administered medications prior to visit.     Review of Systems:   Constitutional:   No  weight loss, night sweats,  Fevers, chills, fatigue, or  lassitude.  HEENT:   No headaches,  Difficulty swallowing,  Tooth/dental problems, or  Sore throat,                No sneezing, itching,  ear ache, nasal congestion, post nasal drip,   CV:  No chest pain,  Orthopnea, PND, swelling in lower extremities, anasarca, dizziness, palpitations, syncope.   GI  No heartburn, indigestion, abdominal pain, nausea, vomiting, diarrhea, change in bowel habits, loss of appetite, bloody stools.   Resp: No shortness of breath with exertion or at rest.  No excess mucus, no productive cough,  No non-productive cough,  No coughing up of blood.  No change in color of mucus.  No wheezing.  No chest Dault deformity  Skin: no rash or lesions.  GU: no dysuria, change in color of urine, no urgency or frequency.  No flank pain, no hematuria   MS:  No joint pain or swelling.  No decreased range of motion.  No back pain.    Physical Exam  BP 132/76 (BP Location: Left Arm, Cuff Size: Normal)   Pulse 72   Temp 98.1 F (36.7 C) (Temporal)   Ht 5' 8"  (1.727 m)   Wt 225 lb 6.4 oz (102.2 kg)   SpO2 97%   BMI 34.27 kg/m   GEN: A/Ox3; pleasant , NAD, well nourished    HEENT:  [place]/AT,  NOSE-clear, THROAT-clear, no lesions, no postnasal drip or exudate noted.  Class 3 MP airway   NECK:  Supple w/ fair ROM; no JVD; normal carotid impulses w/o bruits; no thyromegaly or nodules palpated; no lymphadenopathy.    RESP  Clear  P & A; w/o, wheezes/ rales/ or rhonchi. no accessory muscle use, no dullness to percussion  CARD:  RRR, no m/r/g, no peripheral edema, pulses intact, no cyanosis or  clubbing.  GI:   Soft & nt; nml bowel sounds; no organomegaly or masses detected.   Musco: Warm bil, no deformities or joint swelling noted.   Neuro: alert, no focal deficits noted.    Skin: Warm, no lesions or rashes    Lab Results:  CBC    Component Value Date/Time   WBC 6.9 03/12/2022 0323   RBC 3.76 (L) 03/12/2022 0323   HGB 13.6 03/12/2022 0323   HGB 17.1 11/03/2021 0842   HCT 40.0 03/12/2022 0323   HCT 49.9 11/03/2021 0842   PLT 112 (L) 03/12/2022 0323   PLT 100 (LL) 11/03/2021 0842   MCV 106.4 (H) 03/12/2022 0323   MCV 101 (H) 11/03/2021 0842   MCH 36.2 (H) 03/12/2022 0323   MCHC 34.0 03/12/2022 0323   RDW 14.3 03/12/2022 0323   RDW 12.7 11/03/2021 0842   LYMPHSABS 1.4 03/10/2022 2110   LYMPHSABS 1.6 12/29/2020 0807   MONOABS 1.9 (H) 03/10/2022 2110   EOSABS 0.1 03/10/2022 2110   EOSABS 0.6 (H) 12/29/2020 0807   BASOSABS 0.0 03/10/2022 2110   BASOSABS 0.1 12/29/2020 0807    BMET    Component Value Date/Time   NA 142 03/12/2022 0323   NA 140 10/20/2021 1245   K 3.7 03/12/2022 0323   CL 105 03/12/2022 0323   CO2 29 03/12/2022 0323   GLUCOSE 66 (L) 03/12/2022 0323   BUN 16 03/12/2022 0323   BUN 11 10/20/2021 1245   CREATININE 0.88 03/12/2022 0323   CREATININE 0.82 01/07/2013 0840   CALCIUM 8.8 (L) 03/12/2022 0323   GFRNONAA >60 03/12/2022 0323   GFRNONAA >89 01/07/2013 0840   GFRAA 100 09/07/2020 0920   GFRAA >89 01/07/2013 0840    BNP    Component Value Date/Time   BNP 10.6 04/21/2016 1148    ProBNP    Component Value Date/Time   PROBNP 30.0  02/21/2012 1008    Imaging: No results found.       Latest Ref Rng & Units 02/09/2016    9:57 AM  PFT Results  FVC-Pre L 2.83   FVC-Predicted Pre % 62   FVC-Post L 2.59   FVC-Predicted Post % 56   Pre FEV1/FVC % % 84   Post FEV1/FCV % % 86   FEV1-Pre L 2.38   FEV1-Predicted Pre % 70   FEV1-Post L 2.22   DLCO uncorrected ml/min/mmHg 25.46   DLCO UNC% % 78   DLVA Predicted % 114   TLC  L 4.96   TLC % Predicted % 70   RV % Predicted % 85     No results found for: "NITRICOXIDE"      Assessment & Plan:   OSA (obstructive sleep apnea) OSA -CPAP intolerant s/p Inspire device 09/2021.  Doing very well. Not as comfortable on higher level. Will cut back to Inspire Level 9 , Amp 1.3 V  Go for inspire titration next month as planned   Plan  Patient Instructions  Continue on with Inspire device At bedtime   Plan for Inspire titration study next month.  Healthy sleep regimen  Follow up with Dr. Halford Chessman  as planned next month and As needed         I spent   30 minutes dedicated to the care of this patient on the date of this encounter to include pre-visit review of records, face-to-face time with the patient discussing conditions above, post visit ordering of testing, clinical documentation with the electronic health record, making appropriate referrals as documented, and communicating necessary findings to members of the patients care team.   Rexene Edison, NP 04/27/2022

## 2022-04-27 NOTE — Progress Notes (Signed)
Reviewed and agree with assessment/plan.   Chesley Mires, MD [facility] Pulmonary/Critical Care 04/27/2022, 12:55 PM Pager:  [phone]

## 2022-04-28 ENCOUNTER — Other Ambulatory Visit: Payer: Self-pay | Admitting: Internal Medicine

## 2022-05-04 ENCOUNTER — Other Ambulatory Visit: Payer: Self-pay | Admitting: Orthopedic Surgery

## 2022-05-04 ENCOUNTER — Encounter: Payer: Self-pay | Admitting: Adult Health

## 2022-05-05 DIAGNOSIS — E1122 Type 2 diabetes mellitus with diabetic chronic kidney disease: Secondary | ICD-10-CM | POA: Diagnosis not present

## 2022-05-05 DIAGNOSIS — N182 Chronic kidney disease, stage 2 (mild): Secondary | ICD-10-CM | POA: Diagnosis not present

## 2022-05-05 DIAGNOSIS — Z794 Long term (current) use of insulin: Secondary | ICD-10-CM | POA: Diagnosis not present

## 2022-05-06 ENCOUNTER — Encounter: Payer: Self-pay | Admitting: Internal Medicine

## 2022-05-08 ENCOUNTER — Other Ambulatory Visit: Payer: Self-pay | Admitting: Family Medicine

## 2022-05-08 DIAGNOSIS — E1122 Type 2 diabetes mellitus with diabetic chronic kidney disease: Secondary | ICD-10-CM

## 2022-05-10 DIAGNOSIS — G4733 Obstructive sleep apnea (adult) (pediatric): Secondary | ICD-10-CM | POA: Diagnosis not present

## 2022-05-10 DIAGNOSIS — J449 Chronic obstructive pulmonary disease, unspecified: Secondary | ICD-10-CM | POA: Insufficient documentation

## 2022-05-10 NOTE — Progress Notes (Unsigned)
Cardiology Office Note   Date:  05/11/2022   ID:  Tony Cantu, DOB 1951/01/14, MRN [id number]  PCP:  [name], DO  Cardiologist:   Minus Breeding, MD    Chief Complaint  Patient presents with   Atrial Fibrillation       History of Present Illness: Tony Cantu is a 71 y.o. male who presents for evaluation of arrhythmia.   He was previously seen by Dr. Aundra Dubin.   He had a cardiolite in 2009 that was a normal study.  He uses CPAP for his sleep apnea. He has mild carotid disease.  The patient was admitted previously  with sepsis.  He has had atrial fib with RVR.   Since I last saw him he had right knee replacement.  He actually did pretty well with this.  He also had hypoglossal muscle nerve stimulator.  He is going to have his other knee replaced in October.  He did physical therapy.  He is trying to do some walking but is a little bit limited by the left knee.  However, he has been physically active and has done more than 5 METS routinely and says that he feels well. The patient denies any new symptoms such as chest discomfort, neck or arm discomfort. There has been no new shortness of breath, PND or orthopnea. There have been no reported palpitations, presyncope or syncope.   Past Medical History:  Diagnosis Date   Aortic atherosclerosis ([place])    Aortic stenosis    Arthritis    Asthma    BPH (benign prostatic hypertrophy)    Cirrhosis ([place]) 2016   Colon polyps    Diabetes mellitus without complication ([place])    TYPE 1    Diverticulosis    Dysrhythmia 04/19/2021   A-fib noted with possible A-fib RVR   Gastric ulcer    Gastric varices    right   Gastritis    GERD (gastroesophageal reflux disease)    Headache    History- pt states these were migraines that occurred in the 1980's   Heart murmur    History of kidney stones    Hx of adenomatous colonic polyps    Hypertension    PONV (postoperative nausea and vomiting)    pt states he has never had post op  nausea or vomiting   Portal hypertensive gastropathy (HCC)    Renal cyst, right    Seizures (HCC)    HYPOGLYCEMIC LAST 1 AND 1/2 YRS AGO   Sleep apnea    uses CPAP nightly   Testicle trouble    one testicle BORN WITH   Tubular adenoma of colon     Past Surgical History:  Procedure Laterality Date   BACK SURGERY     8  LOWER    CARPAL TUNNEL RELEASE Right 10/13/2015   Procedure: RIGHT CARPAL TUNNEL RELEASE;  Surgeon: Daryll Brod, MD;  Location: [place];  Service: Orthopedics;  Laterality: Right;   CARPAL TUNNEL RELEASE Left 07/05/2016   Procedure: LEFT CARPAL TUNNEL RELEASE;  Surgeon: Daryll Brod, MD;  Location: [place];  Service: Orthopedics;  Laterality: Left;   DRUG INDUCED ENDOSCOPY N/A 07/02/2021   Procedure: DRUG INDUCED SLEEP ENDOSCOPY;  Surgeon: Melida Quitter, MD;  Location: [place];  Service: ENT;  Laterality: N/A;   IMPLANTATION OF HYPOGLOSSAL NERVE STIMULATOR Right 10/01/2021   Procedure: IMPLANTATION OF HYPOGLOSSAL NERVE STIMULATOR;  Surgeon: Melida Quitter, MD;  Location: [place];  Service:  ENT;  Laterality: Right;   INGUINAL HERNIA REPAIR  2003   right    KNEE ARTHROSCOPY Right 03/03/2016   LUMBAR DISC SURGERY  03/1995   Dr. Coralyn Mark, discectomy   LUMBAR LAMINECTOMY/DECOMPRESSION MICRODISCECTOMY Right 10/06/2016   Procedure: RIGHT LUMBAR THREE - LUMBAR FOUR  LAMINECTOMY, FORAMINOTOMY AND MICRODISCECTOMY;  Surgeon: Jovita Gamma, MD;  Location: [place];  Service: Neurosurgery;  Laterality: Right;   SHOULDER SURGERY  11/28/2005   left partial   SHOULDER SURGERY  07/14/2006   RIGHT   TONSILLECTOMY  AGE 49 OR 4   TOTAL KNEE ARTHROPLASTY Right 03/07/2022   Procedure: RIGHT TOTAL KNEE ARTHROPLASTY;  Surgeon: Frederik Pear, MD;  Location: [facility];  Service: Orthopedics;  Laterality: Right;   TOTAL SHOULDER ARTHROPLASTY Right 11/01/2018   Procedure: RIGHT SHOULDER REVISION TO REVERSE TOTAL SHOULDER;  Surgeon: Tania Ade, MD;  Location: [facility];  Service: Orthopedics;  Laterality: Right;  CHOICE ANESTHESIA WITH INTERSCALENE BLOCK EXPAREL, NEEDS RNFA     Current Outpatient Medications  Medication Sig Dispense Refill   apixaban (ELIQUIS) 5 MG TABS tablet Take 5 mg by mouth 2 (two) times daily.     atorvastatin (LIPITOR) 40 MG tablet TAKE 1 TABLET BY MOUTH DAILY 90 tablet 3   b complex vitamins tablet Take 1 tablet by mouth daily.     Cholecalciferol (VITAMIN D3) 2000 UNITS capsule Take 2,000 Units by mouth daily.     Coenzyme Q10 (COQ10) 200 MG CAPS Take 200 mg by mouth daily.     diltiazem (CARDIZEM) 30 MG tablet TAKE 1 TABLET BY MOUTH  TWICE DAILY (Patient taking differently: Take 30 mg by mouth 2 (two) times daily.) 180 tablet 3   furosemide (LASIX) 20 MG tablet TAKE 1 AND 1/2 TABLETS BY MOUTH  DAILY 135 tablet 3   Glucosamine-Chondroit-Vit C-Mn (GLUCOSAMINE 1500 COMPLEX) CAPS Take 2 capsules by mouth daily.     insulin glargine, 2 Unit Dial, (TOUJEO MAX SOLOSTAR) 300 UNIT/ML Solostar Pen Inject 60 Units into the skin daily.     insulin lispro (HUMALOG) 100 UNIT/ML injection INJECT SUBCUTANEOUSLY 20 TO 50  UNITS 3 TIMES DAILY BEFORE MEALS PER SLIDING SCALE 140 mL 3   MAGNESIUM OXIDE PO Take 500 mg by mouth daily.     montelukast (SINGULAIR) 10 MG tablet TAKE 1 TABLET BY MOUTH AT  BEDTIME (Patient taking differently: Take 10 mg by mouth at bedtime.) 90 tablet 3   multivitamin-lutein (OCUVITE-LUTEIN) CAPS capsule Take 1 capsule by mouth daily.     oxyCODONE-acetaminophen (PERCOCET/ROXICET) 5-325 MG tablet Take 1 tablet by mouth every 4 (four) hours as needed for pain.     pantoprazole (PROTONIX) 40 MG tablet Take 1 tablet (40 mg total) by mouth 2 (two) times daily before a meal. NEEDS OFFICE VISIT FOR ADDITIONAL REFILLS 180 tablet 0   tiZANidine (ZANAFLEX) 2 MG tablet Take 2 mg by mouth every 6 (six) hours as needed for muscle spasms.     Turmeric Curcumin 500 MG CAPS Take 500 mg by mouth daily.     Blood Glucose Monitoring Suppl  (CONTOUR NEXT ONE) KIT Test BS QID and as needed Dx E11.9 500 kit 3   Continuous Blood Gluc Receiver (FREESTYLE LIBRE READER) DEVI 1 applicator by Does not apply route as directed. 1 Device 1   Continuous Blood Gluc Sensor (FREESTYLE LIBRE SENSOR SYSTEM) MISC Check BS eight (8) times a day. Dx E10.9 3 each 3   Glucagon, rDNA, (GLUCAGON EMERGENCY) 1 MG KIT INJECT AS DIRECTED INTO  UPPER ARM, THIGH OR  BUTTOCKS AS NEEDED FOR  SEVERE HYPOGLYCEMIA. SEEK  MEDICAL ATTENTION AFTER USE (Patient taking differently: Inject 1 mg into the muscle as needed (HYPOGLYCEMIA).) 3 kit 0   glucose blood (CONTOUR NEXT TEST) test strip Test BS QID and as needed Dx E11.9 400 strip 3   Microlet Lancets MISC Test BS QID and as needed Dx E11.9 500 each 3   No current facility-administered medications for this visit.    Allergies:   Dimetapp children's cold-cough, Erythromycin, and Sulfonamide derivatives    ROS:  Please see the history of present illness.   Otherwise, review of systems are positive for none.   All other systems are reviewed and negative.    PHYSICAL EXAM: VS:  BP 124/62   Pulse 83   Ht 5' 8" (1.727 m)   Wt 229 lb (103.9 kg)   BMI 34.82 kg/m  , BMI Body mass index is 34.82 kg/m.  GENERAL:  Well appearing NECK:  No jugular venous distention, waveform within normal limits, carotid upstroke brisk and symmetric, no bruits, no thyromegaly LUNGS:  Clear to auscultation bilaterally CHEST:  Unremarkable HEART:  PMI not displaced or sustained,S1 and S2 within normal limits, no S3, no S4, no clicks, no rubs, 3 out of 6 apical systolic murmur early to mid peaking and radiating at the aortic outflow tract, no diastolic murmurs ABD:  Flat, positive bowel sounds normal in frequency in pitch, no bruits, no rebound, no guarding, no midline pulsatile mass, no hepatomegaly, no splenomegaly EXT:  2 plus pulses throughout, no edema, no cyanosis no clubbing   EKG:  EKG is  ordered today. Regular rhythm, rate 83,  probable ectopic atrial rhythm, right bundle branch block, left anterior fascicular block, premature ventricular contractions, no acute ST-T wave changes.   Recent Labs: 03/12/2022: ALT 33; BUN 16; Creatinine, Ser 0.88; Hemoglobin 13.6; Platelets 112; Potassium 3.7; Sodium 142    Lipid Panel    Component Value Date/Time   CHOL 126 01/04/2022 0843   CHOL 107 01/07/2013 0840   TRIG 67 01/04/2022 0843   TRIG 59 03/14/2017 0802   TRIG 47 01/07/2013 0840   HDL 57 01/04/2022 0843   HDL 49 03/14/2017 0802   HDL 53 01/07/2013 0840   CHOLHDL 2.2 01/04/2022 0843   LDLCALC 55 01/04/2022 0843   LDLCALC 44 01/03/2014 0803   LDLCALC 45 01/07/2013 0840     Lab Results  Component Value Date   HGBA1C 4.4 (L) 01/04/2022    Wt Readings from Last 3 Encounters:  05/11/22 229 lb (103.9 kg)  04/27/22 225 lb 6.4 oz (102.2 kg)  04/13/22 226 lb 12.8 oz (102.9 kg)      Other studies Reviewed: Additional studies/ records that were reviewed today include: Labs Review of the above records demonstrates:   See elsewhere   ASSESSMENT AND PLAN:    Paroxysmal atrial fibrillation:  Tony Cantu has a CHA2DS2 - VASc score of 4.  He seems to be tolerating his anticoagulation.  This can be held for his surgery for couple days prior and restarted when felt to be okay from a surgical standpoint.  No change in therapy.   Obstructive sleep apnea: He was to have a follow-up study after his hypoglossal muscle stimulator.  He feels better.  No change in therapy.  Aortic stenosis:    This was moderate with mean gradient of 24.5 in January.  I will repeat this in January 2025.  Preop: The patient has  a high functional level.  He is not going for high risk surgery.  He has no unstable symptoms.  According to ACC/AHA guidelines the patient is at acceptable risk for the planned procedure without further testing.   Current medicines are reviewed at length with the patient today.  The patient does not have  concerns regarding medicines.  The following changes have been made: None  Labs/ tests ordered today include:   None  Orders Placed This Encounter  Procedures   EKG 12-Lead     Disposition:   FU with me in 12 months.      Signed, Minus Breeding, MD  05/11/2022 4:45 PM    [facility]

## 2022-05-11 ENCOUNTER — Encounter: Payer: Self-pay | Admitting: Cardiology

## 2022-05-11 ENCOUNTER — Encounter: Payer: Self-pay | Admitting: Pulmonary Disease

## 2022-05-11 ENCOUNTER — Ambulatory Visit (INDEPENDENT_AMBULATORY_CARE_PROVIDER_SITE_OTHER): Payer: BC Managed Care – PPO | Admitting: Cardiology

## 2022-05-11 ENCOUNTER — Encounter (HOSPITAL_BASED_OUTPATIENT_CLINIC_OR_DEPARTMENT_OTHER): Payer: BC Managed Care – PPO | Admitting: Pulmonary Disease

## 2022-05-11 VITALS — BP 124/62 | HR 83 | Ht 68.0 in | Wt 229.0 lb

## 2022-05-11 DIAGNOSIS — R072 Precordial pain: Secondary | ICD-10-CM

## 2022-05-11 DIAGNOSIS — J449 Chronic obstructive pulmonary disease, unspecified: Secondary | ICD-10-CM

## 2022-05-11 DIAGNOSIS — I480 Paroxysmal atrial fibrillation: Secondary | ICD-10-CM

## 2022-05-11 DIAGNOSIS — I350 Nonrheumatic aortic (valve) stenosis: Secondary | ICD-10-CM

## 2022-05-11 NOTE — Patient Instructions (Signed)
Medication Instructions:  The current medical regimen is effective;  continue present plan and medications.  *If you need a refill on your cardiac medications before your next appointment, please call your pharmacy*  Follow-Up: At [facility], you and your health needs are our priority.  As part of our continuing mission to provide you with exceptional heart care, we have created designated Provider Care Teams.  These Care Teams include your primary Cardiologist (physician) and Advanced Practice Providers (APPs -  Physician Assistants and Nurse Practitioners) who all work together to provide you with the care you need, when you need it.  We recommend signing up for the patient portal called "MyChart".  Sign up information is provided on this After Visit Summary.  MyChart is used to connect with patients for Virtual Visits (Telemedicine).  Patients are able to view lab/test results, encounter notes, upcoming appointments, etc.  Non-urgent messages can be sent to your provider as well.   To learn more about what you can do with MyChart, go to [email or web address].    Your next appointment:   1 year(s)  The format for your next appointment:   In Person  Provider:   Minus Breeding, MD     Important Information About Sugar

## 2022-05-12 ENCOUNTER — Other Ambulatory Visit: Payer: Self-pay | Admitting: Family Medicine

## 2022-05-12 DIAGNOSIS — E1122 Type 2 diabetes mellitus with diabetic chronic kidney disease: Secondary | ICD-10-CM

## 2022-05-17 NOTE — Telephone Encounter (Signed)
Okay to schedule ROV with one of the NPs.

## 2022-05-17 NOTE — Telephone Encounter (Signed)
Dr. Halford Chessman, please advise on pt's message. Pts Inspire sleep study was rescheduled for 9/27. Pt already had his OV with you on 9/26, this will now need to be rescheduled to review sleep study. Pt has a left total knee replacement surgery on 10/9. You have no openings between his sleep study and his knee surgery date.   Please advise if you would like to pt seen by an APP prior to his surgery date, after his sleep study or would you like to double book with you. Thank you.

## 2022-05-19 NOTE — Progress Notes (Addendum)
COVID Vaccine Completed:  Yes  Date of COVID positive in last 90 days:  No  PCP - Ronnie Doss, DO Cardiologist - Minus Breeding, MD Pulmonologist - Chesley Mires, MD  Cardiac clearance in Epic dated 05-11-22 by Dr. Percival Spanish  Chest x-ray - 03-10-22 Epic EKG - 05-11-22 Epic Stress Test - several years ago ECHO - 09-22-21 Epic Cardiac Cath - N/A Pacemaker/ICD device last checked: Spinal Cord Stimulator:  N/A Long Term Monitor - 2022 Epic  Bowel Prep - N/A  Sleep Study - Yes, +sleep apnea CPAP - Inspire for sleep apnea  Freestyle Libre L arm Fasting Blood Sugar - 70 to 130 Checks Blood Sugar - 8 to 12 times a day  Blood Thinner Instructions:  Eliquis. Hold x3 days.  Patient is aware  Aspirin Instructions: Last Dose:  Activity level:  Can go up a flight of stairs and perform activities of daily living without stopping and without symptoms of chest pain or shortness of breath.  Able to exercise without symptoms    Anesthesia review:  Aortic valve stenosis, Afib, COPD, RBBB,  HTN, OSA.  Hx of cirrhosis.  Hx of seizures due to low blood sugar, has not happed in a long time.   Patient denies shortness of breath, fever, cough and chest pain at PAT appointment  Patient verbalized understanding of instructions that were given to them at the PAT appointment. Patient was also instructed that they will need to review over the PAT instructions again at home before surgery.

## 2022-05-19 NOTE — Patient Instructions (Addendum)
SURGICAL WAITING ROOM VISITATION Patients having surgery or a procedure may have no more than 2 support people in the waiting area - these visitors may rotate.   Children under the age of 59 must have an adult with them who is not the patient. If the patient needs to stay at the hospital during part of their recovery, the visitor guidelines for inpatient rooms apply. Pre-op nurse will coordinate an appropriate time for 1 support person to accompany patient in pre-op.  This support person may not rotate.    Please refer to the [facility] website for the visitor guidelines for Inpatients (after your surgery is over and you are in a regular room).      Your procedure is scheduled on: 06-06-22   Report to [facility] Main Entrance    Report to admitting at 5:15 AM   Call this number if you have problems the morning of surgery [phone]   Do not eat food :After Midnight.   After Midnight you may have the following liquids until 4:15 AM DAY OF SURGERY  Water Non-Citrus Juices (without pulp, NO RED) Carbonated Beverages Black Coffee (NO MILK/CREAM OR CREAMERS, sugar ok)  Clear Tea (NO MILK/CREAM OR CREAMERS, sugar ok) regular and decaf                             Plain Jell-O (NO RED)                                           Fruit ices (not with fruit pulp, NO RED)                                     Popsicles (NO RED)                                                               Sports drinks like Gatorade (NO RED)                   The day of surgery:  Drink ONE (1) Pre-Surgery G2 at 4:15 AM the morning of surgery. Drink in one sitting. Do not sip.  This drink was given to you during your hospital  pre-op appointment visit. Nothing else to drink after completing the Pre-Surgery G2.          If you have questions, please contact your surgeon's office.   FOLLOW ANY ADDITIONAL PRE OP INSTRUCTIONS YOU RECEIVED FROM YOUR SURGEON'S OFFICE!!!     Oral Hygiene is also  important to reduce your risk of infection.                                    Remember - BRUSH YOUR TEETH THE MORNING OF SURGERY WITH YOUR REGULAR TOOTHPASTE   Do NOT smoke after Midnight   Take these medicines the morning of surgery with A SIP OF WATER:   Diltiazem  Pantoprazole  Oxycodone  Tylenol if needed  *Hold Eliquis 3  days before surgery    How to Manage Your Diabetes Before and After Surgery  Why is it important to control my blood sugar before and after surgery? Improving blood sugar levels before and after surgery helps healing and can limit problems. A way of improving blood sugar control is eating a healthy diet by:  Eating less sugar and carbohydrates  Increasing activity/exercise  Talking with your doctor about reaching your blood sugar goals High blood sugars (greater than 180 mg/dL) can raise your risk of infections and slow your recovery, so you will need to focus on controlling your diabetes during the weeks before surgery. Make sure that the doctor who takes care of your diabetes knows about your planned surgery including the date and location.  How do I manage my blood sugar before surgery? Check your blood sugar at least 4 times a day, starting 2 days before surgery, to make sure that the level is not too high or low. Check your blood sugar the morning of your surgery when you wake up and every 2 hours until you get to the Short Stay unit. If your blood sugar is less than 70 mg/dL, you will need to treat for low blood sugar: Do not take insulin. Treat a low blood sugar (less than 70 mg/dL) with  cup of clear juice (cranberry or apple), 4 glucose tablets, OR glucose gel. Recheck blood sugar in 15 minutes after treatment (to make sure it is greater than 70 mg/dL). If your blood sugar is not greater than 70 mg/dL on recheck, call [phone] for further instructions. Report your blood sugar to the short stay nurse when you get to Short Stay.  If you are  admitted to the hospital after surgery: Your blood sugar will be checked by the staff and you will probably be given insulin after surgery (instead of oral diabetes medicines) to make sure you have good blood sugar levels. The goal for blood sugar control after surgery is 80-180 mg/dL.   WHAT DO I DO ABOUT MY DIABETES MEDICATION?  Do not take oral diabetes medicines (pills) the morning of surgery.  THE NIGHT BEFORE SURGERY:  Take 34 units of Toujeo at bedtime.                  Take usual doses of Insulin Lispro (no bedtime dose).    THE MORNING OF SURGERY:  Take 1/2 of Insulin Lispro if CBG 220 or higher.  Reviewed and Endorsed by [facility] Patient Education Committee, August 2015                               You may not have any metal on your body including  jewelry, and body piercing             Do not wear  lotions, powders,cologne, or deodorant              Men may shave face and neck.   Do not bring valuables to the hospital. [place].   Contacts, dentures or bridgework may not be worn into surgery.  DO NOT [place]. PHARMACY WILL DISPENSE MEDICATIONS LISTED ON YOUR MEDICATION LIST TO YOU DURING YOUR ADMISSION [place]!   Patients discharged on the day of surgery will not be allowed to drive home.  Someone NEEDS to stay with you for the first 24 hours  after anesthesia.  Please read over the following fact sheets you were given: IF [place] Tony Cantu  If you received a COVID test during your pre-op visit  it is requested that you wear a mask when out in public, stay away from anyone that may not be feeling well and notify your surgeon if you develop symptoms. If you test positive for Covid or have been in contact with anyone that has tested positive in the last 10 days please notify you surgeon.  [place] - Preparing for  Surgery Before surgery, you can play an important role.  Because skin is not sterile, your skin needs to be as free of germs as possible.  You can reduce the number of germs on your skin by washing with CHG (chlorahexidine gluconate) soap before surgery.  CHG is an antiseptic cleaner which kills germs and bonds with the skin to continue killing germs even after washing. Please DO NOT use if you have an allergy to CHG or antibacterial soaps.  If your skin becomes reddened/irritated stop using the CHG and inform your nurse when you arrive at Short Stay. Do not shave (including legs and underarms) for at least 48 hours prior to the first CHG shower.  You may shave your face/neck.  Please follow these instructions carefully:  1.  Shower with CHG Soap the night before surgery and the  morning of surgery.  2.  If you choose to wash your hair, wash your hair first as usual with your normal  shampoo.  3.  After you shampoo, rinse your hair and body thoroughly to remove the shampoo.                             4.  Use CHG as you would any other liquid soap.  You can apply chg directly to the skin and wash.  Gently with a scrungie or clean washcloth.  5.  Apply the CHG Soap to your body ONLY FROM THE NECK DOWN.   Do   not use on face/ open                           Wound or open sores. Avoid contact with eyes, ears mouth and   genitals (private parts).                       Wash face,  Genitals (private parts) with your normal soap.             6.  Wash thoroughly, paying special attention to the area where your    surgery  will be performed.  7.  Thoroughly rinse your body with warm water from the neck down.  8.  DO NOT shower/wash with your normal soap after using and rinsing off the CHG Soap.                9.  Pat yourself dry with a clean towel.            10.  Wear clean pajamas.            11.  Place clean sheets on your bed the night of your first shower and do not  sleep with pets. Day of Surgery  : Do not apply any lotions/deodorants the morning of surgery.  Please wear clean clothes to the hospital/surgery  center.  FAILURE TO FOLLOW THESE INSTRUCTIONS MAY RESULT IN THE CANCELLATION OF YOUR SURGERY  PATIENT SIGNATURE_________________________________  NURSE SIGNATURE__________________________________  ________________________________________________________________________     Adam Phenix  An incentive spirometer is a tool that can help keep your lungs clear and active. This tool measures how well you are filling your lungs with each breath. Taking long deep breaths may help reverse or decrease the chance of developing breathing (pulmonary) problems (especially infection) following: A long period of time when you are unable to move or be active. BEFORE THE PROCEDURE  If the spirometer includes an indicator to show your best effort, your nurse or respiratory therapist will set it to a desired goal. If possible, sit up straight or lean slightly forward. Try not to slouch. Hold the incentive spirometer in an upright position. INSTRUCTIONS FOR USE  Sit on the edge of your bed if possible, or sit up as far as you can in bed or on a chair. Hold the incentive spirometer in an upright position. Breathe out normally. Place the mouthpiece in your mouth and seal your lips tightly around it. Breathe in slowly and as deeply as possible, raising the piston or the ball toward the top of the column. Hold your breath for 3-5 seconds or for as long as possible. Allow the piston or ball to fall to the bottom of the column. Remove the mouthpiece from your mouth and breathe out normally. Rest for a few seconds and repeat Steps 1 through 7 at least 10 times every 1-2 hours when you are awake. Take your time and take a few normal breaths between deep breaths. The spirometer may include an indicator to show your best effort. Use the indicator as a goal to work toward during each  repetition. After each set of 10 deep breaths, practice coughing to be sure your lungs are clear. If you have an incision (the cut made at the time of surgery), support your incision when coughing by placing a pillow or rolled up towels firmly against it. Once you are able to get out of bed, walk around indoors and cough well. You may stop using the incentive spirometer when instructed by your caregiver.  RISKS AND COMPLICATIONS Take your time so you do not get dizzy or light-headed. If you are in pain, you may need to take or ask for pain medication before doing incentive spirometry. It is harder to take a deep breath if you are having pain. AFTER USE Rest and breathe slowly and easily. It can be helpful to keep track of a log of your progress. Your caregiver can provide you with a simple table to help with this. If you are using the spirometer at home, follow these instructions: [place] IF:  You are having difficultly using the spirometer. You have trouble using the spirometer as often as instructed. Your pain medication is not giving enough relief while using the spirometer. You develop fever of 100.5 F (38.1 C) or higher. SEEK IMMEDIATE MEDICAL CARE IF:  You cough up bloody sputum that had not been present before. You develop fever of 102 F (38.9 C) or greater. You develop worsening pain at or near the incision site. MAKE SURE YOU:  Understand these instructions. Will watch your condition. Will get help right away if you are not doing well or get worse. Document Released: 12/26/2006 Document Revised: 11/07/2011 Document Reviewed: 02/26/2007 [facility] Patient Information 2014 [place], [place].   ________________________________________________________________________

## 2022-05-24 ENCOUNTER — Encounter: Payer: Self-pay | Admitting: Family Medicine

## 2022-05-24 ENCOUNTER — Ambulatory Visit: Payer: BC Managed Care – PPO | Admitting: Pulmonary Disease

## 2022-05-24 ENCOUNTER — Other Ambulatory Visit: Payer: Self-pay

## 2022-05-24 ENCOUNTER — Encounter (HOSPITAL_COMMUNITY)
Admission: RE | Admit: 2022-05-24 | Discharge: 2022-05-24 | Disposition: A | Discharge status: Home / Self Care | Payer: BC Managed Care – PPO | Source: Ambulatory Visit | Attending: Orthopedic Surgery | Admitting: Orthopedic Surgery

## 2022-05-24 ENCOUNTER — Encounter (HOSPITAL_COMMUNITY): Payer: Self-pay

## 2022-05-24 VITALS — BP 127/79 | HR 67 | Temp 98.3°F | Resp 20 | Ht 68.0 in | Wt 223.4 lb

## 2022-05-24 DIAGNOSIS — Z794 Long term (current) use of insulin: Secondary | ICD-10-CM | POA: Diagnosis not present

## 2022-05-24 DIAGNOSIS — Z01818 Encounter for other preprocedural examination: Secondary | ICD-10-CM

## 2022-05-24 DIAGNOSIS — E119 Type 2 diabetes mellitus without complications: Secondary | ICD-10-CM | POA: Diagnosis not present

## 2022-05-24 DIAGNOSIS — K769 Liver disease, unspecified: Secondary | ICD-10-CM | POA: Diagnosis not present

## 2022-05-24 DIAGNOSIS — Z01812 Encounter for preprocedural laboratory examination: Secondary | ICD-10-CM | POA: Diagnosis not present

## 2022-05-24 LAB — COMPREHENSIVE METABOLIC PANEL
ALT: 29 U/L (ref 0–44)
AST: 37 U/L (ref 15–41)
Albumin: 3.2 g/dL — ABNORMAL LOW (ref 3.5–5.0)
Alkaline Phosphatase: 82 U/L (ref 38–126)
Anion gap: 5 (ref 5–15)
BUN: 10 mg/dL (ref 8–23)
CO2: 26 mmol/L (ref 22–32)
Calcium: 9.3 mg/dL (ref 8.9–10.3)
Chloride: 110 mmol/L (ref 98–111)
Creatinine, Ser: 0.88 mg/dL (ref 0.61–1.24)
GFR, Estimated: >60 mL/min (ref >60)
Glucose, Bld: 151 mg/dL — ABNORMAL HIGH (ref 70–99)
Potassium: 3.8 mmol/L (ref 3.5–5.1)
Sodium: 141 mmol/L (ref 135–145)
Total Bilirubin: 1.2 mg/dL (ref 0.3–1.2)
Total Protein: 7.2 g/dL (ref 6.5–8.1)

## 2022-05-24 LAB — CBC
HCT: 48.5 % (ref 39.0–52.0)
Hemoglobin: 15.9 g/dL (ref 13.0–17.0)
MCH: 34.8 pg — ABNORMAL HIGH (ref 26.0–34.0)
MCHC: 32.8 g/dL (ref 30.0–36.0)
MCV: 106.1 fL — ABNORMAL HIGH (ref 80.0–100.0)
Platelets: 126 10*3/uL — ABNORMAL LOW (ref 150–400)
RBC: 4.57 MIL/uL (ref 4.22–5.81)
RDW: 14.6 % (ref 11.5–15.5)
WBC: 6.8 10*3/uL (ref 4.0–10.5)
nRBC: 0 % (ref 0.0–0.2)

## 2022-05-24 LAB — SURGICAL PCR SCREEN
MRSA, PCR: NEGATIVE
Staphylococcus aureus: NEGATIVE

## 2022-05-24 LAB — GLUCOSE, CAPILLARY: Glucose-Capillary: 149 mg/dL — ABNORMAL HIGH (ref 70–99)

## 2022-05-24 LAB — HEMOGLOBIN A1C
Hgb A1c MFr Bld: 4.8 % (ref 4.8–5.6)
Mean Plasma Glucose: 91.06 mg/dL

## 2022-05-25 ENCOUNTER — Ambulatory Visit (HOSPITAL_BASED_OUTPATIENT_CLINIC_OR_DEPARTMENT_OTHER): Payer: BC Managed Care – PPO | Attending: Pulmonary Disease | Admitting: Pulmonary Disease

## 2022-05-25 DIAGNOSIS — G4733 Obstructive sleep apnea (adult) (pediatric): Secondary | ICD-10-CM | POA: Diagnosis not present

## 2022-05-25 NOTE — Telephone Encounter (Signed)
Thinking about Dexcom for him.  I think he has an active referral to you but let me know if we need to do another.  Seems to be having undependable sugar readings with libre and he stays low.

## 2022-05-26 DIAGNOSIS — E1122 Type 2 diabetes mellitus with diabetic chronic kidney disease: Secondary | ICD-10-CM | POA: Diagnosis not present

## 2022-05-26 DIAGNOSIS — N182 Chronic kidney disease, stage 2 (mild): Secondary | ICD-10-CM | POA: Diagnosis not present

## 2022-05-26 DIAGNOSIS — Z794 Long term (current) use of insulin: Secondary | ICD-10-CM | POA: Diagnosis not present

## 2022-05-26 NOTE — Anesthesia Preprocedure Evaluation (Addendum)
Anesthesia Evaluation  Patient identified by MRN, date of birth, ID band Patient awake    Reviewed: Allergy & Precautions, NPO status , Patient's Chart, lab work & pertinent test results  History of Anesthesia Complications (+) PONV and history of anesthetic complications  Airway Mallampati: IV  TM Distance: >3 FB Neck ROM: Full    Dental  (+) Dental Advisory Given, Teeth Intact   Pulmonary neg shortness of breath, asthma , sleep apnea , COPD, neg recent URI, former smoker,  HYPOGLOSSAL NERVE STIMULATOR   breath sounds clear to auscultation       Cardiovascular hypertension, Pt. on medications (-) angina(-) Past MI, (-) Cardiac Stents and (-) CABG + dysrhythmias Atrial Fibrillation + Valvular Problems/Murmurs AS  Rhythm:Irregular + Systolic murmurs 1. Left ventricular ejection fraction, by estimation, is 55 to 60%. The  left ventricle has normal function. The left ventricle demonstrates  regional Mcbroom motion abnormalities (see scoring diagram/findings for  description). Left ventricular diastolic  parameters are consistent with Grade I diastolic dysfunction (impaired  relaxation).  2. Right ventricular systolic function is normal. The right ventricular  size is normal. Tricuspid regurgitation signal is inadequate for assessing  PA pressure.  3. Left atrial size was mildly dilated.  4. The mitral valve is degenerative. Mild mitral valve regurgitation.  5. The aortic valve is tricuspid. There is moderate calcification of the  aortic valve. Aortic valve regurgitation is mild. Moderate aortic valve  stenosis. Aortic valve area, by VTI measures 1.28 cm. Aortic valve mean  gradient measures 23.5 mmHg.  Dimentionless index 0.41.  6. The inferior vena cava is dilated in size with >50% respiratory  variability, suggesting right atrial pressure of 8 mmHg.    Neuro/Psych  Headaches,  Neuromuscular disease    GI/Hepatic PUD, GERD   ,(+) Cirrhosis       , Lab Results      Component                Value               Date                      ALT                      29                  05/24/2022                AST                      37                  05/24/2022                ALKPHOS                  82                  05/24/2022                BILITOT                  1.2                 05/24/2022              Endo/Other  diabetes, Insulin Dependent  Renal/GU Renal disease  Musculoskeletal  (+) Arthritis ,   Abdominal   Peds  Hematology Lab Results      Component                Value               Date                      WBC                      6.8                 05/24/2022                HGB                      15.9                05/24/2022                HCT                      48.5                05/24/2022                MCV                      106.1 (H)           05/24/2022                PLT                      126 (L)             05/24/2022            eliquis stopped last thursday   Anesthesia Other Findings 71 y.o. former smoker with h/o PONV, HTN, sleep apnea, asthma, DM II, atrial fibrillation (Eliquis), AS mean gradient 23.5 mmHg, valve area 1.28 cm2), left knee OA scheduled for above procedure 06/06/2022 with Dr. Frederik Pear.   Hypoglossal nerve stimulutor implanted 10/01/21.  Last seen by pulmonologist 02/14/2022. Per OV note, "there are no pulmonary contra-indications for him to have surgery. He should resume using Inspire device when asleep after surgery"  Pt lase seen by cardiology 05/11/2022. Per OV note, "Preop:The patient has a high functional level. He is not going for high risk surgery. He has no unstable symptoms. According to ACC/AHA guidelines the patient is at acceptable risk for the planned procedure without further testing."  Pt advised to hold Eliquis 3 days prior to procedure.   Reproductive/Obstetrics                            Anesthesia Physical Anesthesia Plan  ASA: 3  Anesthesia Plan: Regional and General   Post-op Pain Management: Ofirmev IV (intra-op)* and Toradol IV (intra-op)*   Induction: Intravenous  PONV Risk Score and Plan: 3 and Treatment may vary due to age or medical condition, Ondansetron and Dexamethasone  Airway Management Planned: Nasal Cannula, Natural Airway and LMA  Additional Equipment: None  Intra-op Plan:   Post-operative Plan: Extubation in OR  Informed Consent: I have reviewed the patients History and Physical, chart, labs and discussed the procedure including the risks, benefits and alternatives  for the proposed anesthesia with the patient or authorized representative who has indicated his/her understanding and acceptance.     Dental advisory given  Plan Discussed with: CRNA  Anesthesia Plan Comments: (See PAT note 05/24/2022)     Anesthesia Quick Evaluation

## 2022-05-26 NOTE — Progress Notes (Signed)
Anesthesia Chart Review   Case: [id number] Date/Time: 06/06/22 0700   Procedure: LEFT TOTAL KNEE ARTHROPLASTY (Left: Knee)   Anesthesia type: Spinal   Pre-op diagnosis: LEFT KNEE OSTEOARTHRITIS   Location: [place] [facility]   Surgeons: Frederik Pear, MD       DISCUSSION:71 y.o. former smoker with h/o PONV, HTN, sleep apnea, asthma, DM II, atrial fibrillation (Eliquis), AS mean gradient 23.5 mmHg, valve area 1.28 cm2), left knee OA scheduled for above procedure 06/06/2022 with Dr. Frederik Pear.   Hypoglossal nerve stimulutor implanted 10/01/21.  Last seen by pulmonologist 02/14/2022.  Per OV note, "there are no pulmonary contra-indications for him to have surgery. He should resume using Inspire device when asleep after surgery"  Pt lase seen by cardiology 05/11/2022. Per OV note, "Preop: The patient has a high functional level.  He is not going for high risk surgery.  He has no unstable symptoms.  According to ACC/AHA guidelines the patient is at acceptable risk for the planned procedure without further testing."  Pt advised to hold Eliquis 3 days prior to procedure.   Anticipate pt can proceed with planned procedure barring acute status change.   VS: BP 127/79   Pulse 67   Temp 36.8 C (Oral)   Resp 20   Ht 5' 8"  (1.727 m)   Wt 101.3 kg   SpO2 97%   BMI 33.97 kg/m   PROVIDERS: [name], DO is PCP   Cardiologist - Minus Breeding, MD  Pulmonologist - Chesley Mires, MD LABS: Labs reviewed: Acceptable for surgery. (all labs ordered are listed, but only abnormal results are displayed)  Labs Reviewed  COMPREHENSIVE METABOLIC PANEL - Abnormal; Notable for the following components:      Result Value   Glucose, Bld 151 (*)    Albumin 3.2 (*)    All other components within normal limits  CBC - Abnormal; Notable for the following components:   MCV 106.1 (*)    MCH 34.8 (*)    Platelets 126 (*)    All other components within normal limits  GLUCOSE, CAPILLARY - Abnormal;  Notable for the following components:   Glucose-Capillary 149 (*)    All other components within normal limits  SURGICAL PCR SCREEN  HEMOGLOBIN A1C     IMAGES:   EKG:   CV: Echo 09/22/2021 1. Left ventricular ejection fraction, by estimation, is 55 to 60%. The  left ventricle has normal function. The left ventricle demonstrates  regional Gadomski motion abnormalities (see scoring diagram/findings for  description). Left ventricular diastolic  parameters are consistent with Grade I diastolic dysfunction (impaired  relaxation).   2. Right ventricular systolic function is normal. The right ventricular  size is normal. Tricuspid regurgitation signal is inadequate for assessing  PA pressure.   3. Left atrial size was mildly dilated.   4. The mitral valve is degenerative. Mild mitral valve regurgitation.   5. The aortic valve is tricuspid. There is moderate calcification of the  aortic valve. Aortic valve regurgitation is mild. Moderate aortic valve  stenosis. Aortic valve area, by VTI measures 1.28 cm. Aortic valve mean  gradient measures 23.5 mmHg.  Dimentionless index 0.41.   6. The inferior vena cava is dilated in size with >50% respiratory  variability, suggesting right atrial pressure of 8 mmHg.  Past Medical History:  Diagnosis Date   Aortic atherosclerosis (HCC)    Aortic stenosis    Arthritis    Asthma    BPH (benign prostatic hypertrophy)  Cirrhosis ([place]) 2016   Colon polyps    Diabetes mellitus without complication ([place])    TYPE 1    Diverticulosis    Dysrhythmia 04/19/2021   A-fib noted with possible A-fib RVR   Gastric ulcer    Gastric varices    right   Gastritis    GERD (gastroesophageal reflux disease)    Headache    History- pt states these were migraines that occurred in the 1980's   Heart murmur    History of kidney stones    Hx of adenomatous colonic polyps    Hypertension    PONV (postoperative nausea and vomiting)    pt states he has never had  post op nausea or vomiting   Portal hypertensive gastropathy (HCC)    Renal cyst, right    Seizures (HCC)    HYPOGLYCEMIC LAST 1 AND 1/2 YRS AGO   Sleep apnea    uses CPAP nightly   Testicle trouble    one testicle BORN WITH   Tubular adenoma of colon     Past Surgical History:  Procedure Laterality Date   BACK SURGERY     51  LOWER    CARPAL TUNNEL RELEASE Right 10/13/2015   Procedure: RIGHT CARPAL TUNNEL RELEASE;  Surgeon: Daryll Brod, MD;  Location: [place];  Service: Orthopedics;  Laterality: Right;   CARPAL TUNNEL RELEASE Left 07/05/2016   Procedure: LEFT CARPAL TUNNEL RELEASE;  Surgeon: Daryll Brod, MD;  Location: [place];  Service: Orthopedics;  Laterality: Left;   DRUG INDUCED ENDOSCOPY N/A 07/02/2021   Procedure: DRUG INDUCED SLEEP ENDOSCOPY;  Surgeon: Melida Quitter, MD;  Location: [place];  Service: ENT;  Laterality: N/A;   IMPLANTATION OF HYPOGLOSSAL NERVE STIMULATOR Right 10/01/2021   Procedure: IMPLANTATION OF HYPOGLOSSAL NERVE STIMULATOR;  Surgeon: Melida Quitter, MD;  Location: [place];  Service: ENT;  Laterality: Right;   INGUINAL HERNIA REPAIR  2003   right    KNEE ARTHROSCOPY Right 03/03/2016   LUMBAR DISC SURGERY  03/1995   Dr. Coralyn Mark, discectomy   LUMBAR LAMINECTOMY/DECOMPRESSION MICRODISCECTOMY Right 10/06/2016   Procedure: RIGHT LUMBAR THREE - LUMBAR FOUR  LAMINECTOMY, FORAMINOTOMY AND MICRODISCECTOMY;  Surgeon: Jovita Gamma, MD;  Location: [place];  Service: Neurosurgery;  Laterality: Right;   SHOULDER SURGERY  11/28/2005   left partial   SHOULDER SURGERY  07/14/2006   RIGHT   TONSILLECTOMY  AGE 50 OR 4   TOTAL KNEE ARTHROPLASTY Right 03/07/2022   Procedure: RIGHT TOTAL KNEE ARTHROPLASTY;  Surgeon: Frederik Pear, MD;  Location: [facility];  Service: Orthopedics;  Laterality: Right;   TOTAL SHOULDER ARTHROPLASTY Right 11/01/2018   Procedure: RIGHT SHOULDER REVISION TO REVERSE TOTAL SHOULDER;  Surgeon: Tania Ade, MD;   Location: [facility];  Service: Orthopedics;  Laterality: Right;  CHOICE ANESTHESIA WITH INTERSCALENE BLOCK EXPAREL, NEEDS RNFA    MEDICATIONS:  acetaminophen (TYLENOL) 500 MG tablet   apixaban (ELIQUIS) 5 MG TABS tablet   atorvastatin (LIPITOR) 40 MG tablet   b complex vitamins tablet   Blood Glucose Monitoring Suppl (CONTOUR NEXT ONE) KIT   Cholecalciferol (DIALYVITE VITAMIN D 5000) 125 MCG (5000 UT) capsule   Chromium Picolinate (CHROMIUM PICOLATE PO)   CINNAMON PO   Coenzyme Q10 (COQ10) 100 MG CAPS   Continuous Blood Gluc Receiver (FREESTYLE LIBRE READER) DEVI   Continuous Blood Gluc Sensor (FREESTYLE LIBRE SENSOR SYSTEM) MISC   diltiazem (CARDIZEM) 30 MG tablet   furosemide (LASIX) 20 MG tablet   Glucagon, rDNA, (GLUCAGON EMERGENCY)  1 MG KIT   Glucosamine-Chondroit-Vit C-Mn (GLUCOSAMINE 1500 COMPLEX) CAPS   glucose blood (CONTOUR NEXT TEST) test strip   insulin lispro (HUMALOG) 100 UNIT/ML injection   Magnesium 250 MG TABS   METAMUCIL FIBER PO   Microlet Lancets MISC   montelukast (SINGULAIR) 10 MG tablet   Multiple Vitamins-Minerals (EYE HEALTH) CAPS   oxyCODONE-acetaminophen (PERCOCET/ROXICET) 5-325 MG tablet   pantoprazole (PROTONIX) 40 MG tablet   tiZANidine (ZANAFLEX) 2 MG tablet   TOUJEO MAX SOLOSTAR 300 UNIT/ML Solostar Pen   Turmeric Curcumin 500 MG CAPS   No current facility-administered medications for this encounter.    Tony Felix Ward, PA-C [facility] [phone]

## 2022-05-31 ENCOUNTER — Ambulatory Visit (INDEPENDENT_AMBULATORY_CARE_PROVIDER_SITE_OTHER): Payer: BC Managed Care – PPO | Admitting: Adult Health

## 2022-05-31 ENCOUNTER — Encounter: Payer: Self-pay | Admitting: Adult Health

## 2022-05-31 ENCOUNTER — Telehealth: Payer: Self-pay | Admitting: Pulmonary Disease

## 2022-05-31 VITALS — BP 126/80 | HR 53 | Temp 97.8°F | Ht 68.0 in | Wt 225.4 lb

## 2022-05-31 DIAGNOSIS — G4733 Obstructive sleep apnea (adult) (pediatric): Secondary | ICD-10-CM | POA: Diagnosis not present

## 2022-05-31 DIAGNOSIS — Z23 Encounter for immunization: Secondary | ICD-10-CM | POA: Diagnosis not present

## 2022-05-31 NOTE — Assessment & Plan Note (Signed)
Severe obstructive sleep apnea with previous CPAP intolerance now status post inspire device from February 2023.  Patient is doing very well.  Recent titration study showed good control with residual AHI at around 6.  This is significantly improved from his previous preinspire AHI at around 50/hour  We discussed healthy sleep regimen.  He is to continue on inspire at bedtime.  Plan  Patient Instructions  Continue on with Inspire device At bedtime   Healthy sleep regimen  Follow up with Dr. Halford Chessman in 6 months and As needed

## 2022-05-31 NOTE — Progress Notes (Signed)
_0  ID: Tony Cantu, male    DOB: [birth year], 71 y.o.   MRN: [id number]  Chief Complaint  Patient presents with   Follow-up    Referring provider: [name], DO  HPI: 71 year old male followed for severe obstructive sleep apnea.  He has been CPAP intolerant.  He underwent the inspire device implantation October 01, 2021 Medical history significant for A-fib, diabetes  TEST/EVENTS :  PSG 05/02/12 >> AHI 76 Home sleep study 04/07/21 >> AHI 49.3, SpO2 low 79% .  05/31/2022 Follow up : OSA -Inspire device  Patient presents for a 96-monthfollow-up.  Patient has severe obstructive sleep apnea and has been CPAP intolerant.  He underwent the inspire implantation February 30 2023.  Patient says he is doing well seems to be tolerating inspire well.  Inspire compliance has shown 100% compliance usually use in between 7 to 8 hours.  He underwent the inspire titration study that was completed on May 25, 2022.  Tech report showed incoming amplitude at 1.3 V.  Average usage was 62 hours/week.  Therapeutic amplitude was found at 1.1 V.  With an estimated AHI at 6.3/hour.  Patient says overall he is doing well.  Patient says he still has some trouble with insomnia.  But when he sleeps he feels much improved with the inspire device. Inspire team today.  Inspire stimulation test today in the office with mid range at 1.1 V. Plans for upcoming left knee surgery.  Allergies  Allergen Reactions   Dimetapp Children's Cold-Cough     Chest discomfort    Erythromycin Diarrhea   Sulfonamide Derivatives Diarrhea    Immunization History  Administered Date(s) Administered   Fluad Quad(high Dose 65+) 05/17/2019, 05/19/2020, 07/06/2021   Influenza Split 06/05/2013   Influenza Whole 06/15/2010, 06/16/2011, 05/29/2012   Influenza, High Dose Seasonal PF 06/25/2015, 06/25/2015, 06/12/2017   Influenza,inj,Quad PF,6+ Mos 06/05/2013, 06/17/2014, 06/07/2016   Influenza-Unspecified 07/04/2008,  06/17/2014, 06/25/2015, 06/12/2017, 05/22/2018   Moderna SARS-COV2 Booster Vaccination 08/26/2020   Moderna Sars-Covid-2 Vaccination 11/07/2019, 12/05/2019, 08/26/2020   Pneumococcal Conjugate-13 06/05/2013   Pneumococcal Polysaccharide-23 06/03/2011, 07/04/2011, 09/16/2019   Tdap 08/30/2007, 12/27/2017   Zoster Recombinat (Shingrix) 01/05/2022, 04/13/2022   Zoster, Live 02/01/2012    Past Medical History:  Diagnosis Date   Aortic atherosclerosis ([place]    Aortic stenosis    Arthritis    Asthma    BPH (benign prostatic hypertrophy)    Cirrhosis ([place] 2016   Colon polyps    Diabetes mellitus without complication ([place]    TYPE 1    Diverticulosis    Dysrhythmia 04/19/2021   A-fib noted with possible A-fib RVR   Gastric ulcer    Gastric varices    right   Gastritis    GERD (gastroesophageal reflux disease)    Headache    History- pt states these were migraines that occurred in the 1980's   Heart murmur    History of kidney stones    Hx of adenomatous colonic polyps    Hypertension    PONV (postoperative nausea and vomiting)    pt states he has never had post op nausea or vomiting   Portal hypertensive gastropathy (HCC)    Renal cyst, right    Seizures (HCC)    HYPOGLYCEMIC LAST 1 AND 1/2 YRS AGO   Sleep apnea    uses CPAP nightly   Testicle trouble    one testicle BORN WITH   Tubular adenoma of colon     Tobacco History: Social History  Tobacco Use  Smoking Status Former   Years: 34.00   Types: Cigarettes, Pipe   Quit date: 2005   Years since quitting: 18.7  Smokeless Tobacco Never  Tobacco Comments   quit 2005 smoked cigarettes for 5 yrs prior to pipe use   Counseling given: Not Answered Tobacco comments: quit 2005 smoked cigarettes for 5 yrs prior to pipe use   Outpatient Medications Prior to Visit  Medication Sig Dispense Refill   acetaminophen (TYLENOL) 500 MG tablet Take 1,000 mg by mouth every 6 (six) hours as needed for moderate pain.      apixaban (ELIQUIS) 5 MG TABS tablet Take 5 mg by mouth 2 (two) times daily.     atorvastatin (LIPITOR) 40 MG tablet TAKE 1 TABLET BY MOUTH DAILY (Patient taking differently: Take 40 mg by mouth at bedtime.) 90 tablet 3   b complex vitamins tablet Take 1 tablet by mouth daily.     Blood Glucose Monitoring Suppl (CONTOUR NEXT ONE) KIT Test BS QID and as needed Dx E11.9 500 kit 3   Cholecalciferol (DIALYVITE VITAMIN D 5000) 125 MCG (5000 UT) capsule Take 5,000 Units by mouth daily.     Chromium Picolinate (CHROMIUM PICOLATE PO) Take 1 tablet by mouth daily.     CINNAMON PO Take 1,080 mg by mouth daily.     Coenzyme Q10 (COQ10) 100 MG CAPS Take 100 mg by mouth daily.     Continuous Blood Gluc Receiver (FREESTYLE LIBRE READER) DEVI 1 applicator by Does not apply route as directed. 1 Device 1   Continuous Blood Gluc Sensor (FREESTYLE LIBRE SENSOR SYSTEM) MISC Check BS eight (8) times a day. Dx E10.9 3 each 3   diltiazem (CARDIZEM) 30 MG tablet TAKE 1 TABLET BY MOUTH  TWICE DAILY 180 tablet 3   furosemide (LASIX) 20 MG tablet TAKE 1 AND 1/2 TABLETS BY MOUTH  DAILY (Patient taking differently: Take 20 mg by mouth daily.) 135 tablet 3   Glucagon, rDNA, (GLUCAGON EMERGENCY) 1 MG KIT INJECT AS DIRECTED INTO  UPPER ARM, THIGH OR  BUTTOCKS AS NEEDED FOR  SEVERE HYPOGLYCEMIA. SEEK  MEDICAL ATTENTION AFTER USE (Patient taking differently: Inject 1 mg into the muscle as needed (HYPOGLYCEMIA).) 3 kit 0   Glucosamine-Chondroit-Vit C-Mn (GLUCOSAMINE 1500 COMPLEX) CAPS Take 2 capsules by mouth daily.     glucose blood (CONTOUR NEXT TEST) test strip Test BS QID and as needed Dx E11.9 400 strip 3   insulin lispro (HUMALOG) 100 UNIT/ML injection INJECT SUBCUTANEOUSLY 20 TO 50  UNITS 3 TIMES DAILY BEFORE MEALS PER SLIDING SCALE (Patient taking differently: Inject 10-30 Units into the skin See admin instructions. Inject 10-30 units up to 7 times daily as needed for high blood sugar) 140 mL 3   Magnesium 250 MG TABS Take 250  mg by mouth daily.     METAMUCIL FIBER PO Take 1 capsule by mouth 2 (two) times daily.     Microlet Lancets MISC Test BS QID and as needed Dx E11.9 500 each 3   montelukast (SINGULAIR) 10 MG tablet TAKE 1 TABLET BY MOUTH AT  BEDTIME 90 tablet 3   Multiple Vitamins-Minerals (EYE HEALTH) CAPS Take 1 capsule by mouth daily.     oxyCODONE-acetaminophen (PERCOCET/ROXICET) 5-325 MG tablet Take 1 tablet by mouth at bedtime as needed for pain.     pantoprazole (PROTONIX) 40 MG tablet Take 1 tablet (40 mg total) by mouth 2 (two) times daily before a meal. NEEDS OFFICE VISIT FOR ADDITIONAL REFILLS 180 tablet  0   tiZANidine (ZANAFLEX) 2 MG tablet Take 2 mg by mouth every 8 (eight) hours as needed for muscle spasms.     TOUJEO MAX SOLOSTAR 300 UNIT/ML Solostar Pen INJECT SUBCUTANEOUSLY 110 UNITS  DAILY (Patient taking differently: Inject 70 Units into the skin at bedtime.) 36 mL 3   Turmeric Curcumin 500 MG CAPS Take 500 mg by mouth daily.     No facility-administered medications prior to visit.     Review of Systems:   Constitutional:   No  weight loss, night sweats,  Fevers, chills,  +fatigue, or  lassitude.  HEENT:   No headaches,  Difficulty swallowing,  Tooth/dental problems, or  Sore throat,                No sneezing, itching, ear ache, nasal congestion, post nasal drip,   CV:  No chest pain,  Orthopnea, PND, swelling in lower extremities, anasarca, dizziness, palpitations, syncope.   GI  No heartburn, indigestion, abdominal pain, nausea, vomiting, diarrhea, change in bowel habits, loss of appetite, bloody stools.   Resp: No shortness of breath with exertion or at rest.  No excess mucus, no productive cough,  No non-productive cough,  No coughing up of blood.  No change in color of mucus.  No wheezing.  No chest [place] deformity  Skin: no rash or lesions.  GU: no dysuria, change in color of urine, no urgency or frequency.  No flank pain, no hematuria   MS:  No joint pain or swelling.  No  decreased range of motion.  No back pain.    Physical Exam  BP 126/80 (BP Location: Left Arm, Patient Position: Sitting, Cuff Size: Large)   Pulse (!) 53   Temp 97.8 F (36.6 C) (Oral)   Ht 5' 8" (1.727 m)   Wt 225 lb 6.4 oz (102.2 kg)   SpO2 96%   BMI 34.27 kg/m   GEN: A/Ox3; pleasant , NAD, well nourished    HEENT:  [place]/AT,  NOSE-clear, THROAT-clear, no lesions, no postnasal drip or exudate noted.  Tongue midline   NECK:  Supple w/ fair ROM; no JVD; normal carotid impulses w/o bruits; no thyromegaly or nodules palpated; no lymphadenopathy.    RESP  Clear  P & A; w/o, wheezes/ rales/ or rhonchi. no accessory muscle use, no dullness to percussion  CARD:  RRR, no m/r/g, no peripheral edema, pulses intact, no cyanosis or clubbing.  GI:   Soft & nt; nml bowel sounds; no organomegaly or masses detected.   Musco: Warm bil, no deformities or joint swelling noted.   Neuro: alert, no focal deficits noted.    Skin: Warm, no lesions or rashes    Lab Results:  CBC   BMET     Imaging: SLEEP STUDY DOCUMENTS  Result Date: 05/26/2022 Ordered by an unspecified provider.        Latest Ref Rng & Units 02/09/2016    9:57 AM  PFT Results  FVC-Pre L 2.83   FVC-Predicted Pre % 62   FVC-Post L 2.59   FVC-Predicted Post % 56   Pre FEV1/FVC % % 84   Post FEV1/FCV % % 86   FEV1-Pre L 2.38   FEV1-Predicted Pre % 70   FEV1-Post L 2.22   DLCO uncorrected ml/min/mmHg 25.46   DLCO UNC% % 78   DLVA Predicted % 114   TLC L 4.96   TLC % Predicted % 70   RV % Predicted % 85     No  results found for: "NITRICOXIDE"      Assessment & Plan:   OSA (obstructive sleep apnea) Severe obstructive sleep apnea with previous CPAP intolerance now status post inspire device from February 2023.  Patient is doing very well.  Recent titration study showed good control with residual AHI at around 6.  This is significantly improved from his previous preinspire AHI at around 50/hour  We  discussed healthy sleep regimen.  He is to continue on inspire at bedtime.  Plan  Patient Instructions  Continue on with Inspire device At bedtime   Healthy sleep regimen  Follow up with Dr. Halford Chessman in 6 months and As needed         Rexene Edison, NP 05/31/2022

## 2022-05-31 NOTE — Progress Notes (Signed)
Reviewed and agree with assessment/plan.   Chesley Mires, MD [facility] Pulmonary/Critical Care 05/31/2022, 2:42 PM Pager:  [phone]

## 2022-05-31 NOTE — Telephone Encounter (Signed)
Entered in error

## 2022-05-31 NOTE — Patient Instructions (Signed)
Continue on with Inspire device At bedtime   Healthy sleep regimen  Follow up with Dr. Halford Chessman in 6 months and As needed

## 2022-05-31 NOTE — Procedures (Signed)
   Patient Name: Tony Cantu, Tony Cantu Date: 05/25/2022 Gender: Male D.O.B: February 19, 1951 Age (years): 61 Referring Provider: Chesley Mires MD, ABSM Height (inches): 68 Interpreting Physician: Chesley Mires MD, ABSM Weight (lbs): 222 RPSGT: Laren Everts BMI: 34 MRN: [id number] Neck Size: 18.00  CLINICAL INFORMATION The patient is referred for a hypoglossal nerve stimulator titration to treat obstructive sleep apnea.  Date of HST 04/07/21: AHI 49.3, SpO2 low 79%.  SLEEP STUDY TECHNIQUE As per the AASM Manual for the Scoring of Sleep and Associated Events v2.3 (April 2016) with a hypopnea requiring 4% desaturations.  The channels recorded and monitored were frontal, central and occipital EEG, electrooculogram (EOG), submentalis EMG (chin), nasal and oral airflow, thoracic and abdominal [place] motion, anterior tibialis EMG, snore microphone, electrocardiogram, and pulse oximetry. Continuous positive airway pressure (CPAP) was initiated at the beginning of the study and titrated to treat sleep-disordered breathing.  MEDICATIONS Medications self-administered by patient taken the night of the study : HUMALOG, [place] Comments added by technician: None Comments added by scorer: N/A  RESPIRATORY PARAMETERS The patient's incoming voltage was 1.3 V.  The starting amplitude was 1.1 V and increased to 1.5 V with an AHI of 10.3 and SpO2 low of 89%.  SLEEP ARCHITECTURE The study was initiated at 10:03:32 PM and ended at 5:21:19 AM.  Sleep onset time was 53.6 minutes and the sleep efficiency was 49.8%%. The total sleep time was 218 minutes.  The patient spent 15.4%% of the night in stage N1 sleep, 72.0%% in stage N2 sleep, 0.0%% in stage N3 and 12.6% in REM.Stage REM latency was 167.0 minutes  Wake after sleep onset was 166.1. Alpha intrusion was absent. Supine sleep was 17.62%.  CARDIAC DATA The 2 lead EKG demonstrated sinus rhythm. The mean heart rate was 65.1 beats  per minute. Other EKG findings include: PVCs.  LEG MOVEMENT DATA The total Periodic Limb Movements of Sleep (PLMS) were 0. The PLMS index was 0.0. A PLMS index of <15 is considered normal in adults.  IMPRESSIONS - Optimal hypoglossal nerve stimulator setting of 1.5 V.  DIAGNOSIS - Obstructive Sleep Apnea (G47.33)  RECOMMENDATIONS - Set hypoglossal nerve stimulator at 1.5 V. - Avoid alcohol, sedatives and other CNS depressants that may worsen sleep apnea and disrupt normal sleep architecture. - Sleep hygiene should be reviewed to assess factors that may improve sleep quality. - Weight management and regular exercise should be initiated or continued.  [Electronically signed] 05/31/2022 02:29 PM  Chesley Mires MD, [place], American Board of Sleep Medicine NPI: [id number]

## 2022-06-01 ENCOUNTER — Telehealth: Payer: Self-pay | Admitting: Adult Health

## 2022-06-01 NOTE — Telephone Encounter (Signed)
Official Inspire titration study read : Inspire titration study.  Looked like 1.5 V was good setting.  Will need to change to 1.5V . Please contact Inspire and patient  so we can get this changed .

## 2022-06-01 NOTE — Telephone Encounter (Signed)
Reviewed the report in detail . Will leave at current setting for now . He is comfortable and acceptable AHI at 1.1 V . So no changes for now . Will review with sleep team going forward No need to call patient. Or Dawna Part

## 2022-06-02 ENCOUNTER — Other Ambulatory Visit: Payer: Self-pay | Admitting: Pulmonary Disease

## 2022-06-03 DIAGNOSIS — M1712 Unilateral primary osteoarthritis, left knee: Secondary | ICD-10-CM | POA: Diagnosis present

## 2022-06-03 NOTE — H&P (Signed)
TOTAL KNEE ADMISSION H&P  Patient is being admitted for left total knee arthroplasty.  Subjective:  Chief Complaint:left knee pain.  HPI: Tony Cantu, 71 y.o. male, has a history of pain and functional disability in the left knee due to arthritis and has failed non-surgical conservative treatments for greater than 12 weeks to includeNSAID's and/or analgesics, corticosteriod injections, viscosupplementation injections, flexibility and strengthening excercises, supervised PT with diminished ADL's post treatment, use of assistive devices, weight reduction as appropriate, and activity modification.  Onset of symptoms was gradual, starting 4 years ago with gradually worsening course since that time. The patient noted prior procedures on the knee to include  arthroscopy on the left knee(s).  Patient currently rates pain in the left knee(s) at 10 out of 10 with activity. Patient has night pain, worsening of pain with activity and weight bearing, pain that interferes with activities of daily living, pain with passive range of motion, crepitus, and joint swelling.  Patient has evidence of subchondral sclerosis, periarticular osteophytes, and joint space narrowing by imaging studies. There is no active infection.  Patient Active Problem List   Diagnosis Date Noted   Degenerative arthritis of left knee 06/03/2022   Chronic obstructive pulmonary disease ([place]) 05/10/2022   Grade I diastolic dysfunction 70/26/3785   Postoperative fever 03/10/2022   S/P TKR (total knee replacement), right 03/07/2022   Osteoarthritis of right knee 03/04/2022   Precordial chest pain 04/20/2021   Former smoker 01/12/2021   PAF (paroxysmal atrial fibrillation) ([place]) 06/22/2020   Hypertension associated with type 2 diabetes mellitus ([place]) 05/17/2019   Hyperlipidemia associated with type 2 diabetes mellitus ([place]) 05/17/2019   GERD with esophagitis 05/17/2019   H/O total shoulder replacement, right 11/01/2018   Paroxysmal  tachycardia ([place]) 01/11/2017   Nonrheumatic aortic valve stenosis 01/11/2017   Aortic atherosclerosis ([place]) 11/23/2016   HNP (herniated nucleus pulposus), lumbar 10/06/2016   Acute renal injury ([place]) 08/06/2016   Diarrhea 08/06/2016   Fever 08/06/2016   Hyperbilirubinemia 08/06/2016   Lactic acidosis 08/06/2016   Inguinal hernia 12/10/2015   Right groin pain 11/30/2015   Carpal tunnel syndrome on right 09/09/2015   Cervical spondylosis without myelopathy 09/09/2015   Thrombocytopenia ([place]) 07/21/2014   Bilateral carotid bruits 05/11/2014   Vitamin D deficiency 09/17/2013   BPH (benign prostatic hyperplasia) 05/22/2013   Low serum testosterone level 02/18/2013   Diabetes type 2, controlled ([place]) 01/10/2013   OSA (obstructive sleep apnea) 05/16/2012   Edema 02/21/2012   At risk for coronary artery disease 03/20/2011   Obesity 03/20/2011   Allergic rhinitis 06/22/2010   ASTHMA 06/22/2010   COUGH 06/22/2010   Past Medical History:  Diagnosis Date   Aortic atherosclerosis ([place])    Aortic stenosis    Arthritis    Asthma    BPH (benign prostatic hypertrophy)    Cirrhosis ([place]) 2016   Colon polyps    Diabetes mellitus without complication ([place])    TYPE 1    Diverticulosis    Dysrhythmia 04/19/2021   A-fib noted with possible A-fib RVR   Gastric ulcer    Gastric varices    right   Gastritis    GERD (gastroesophageal reflux disease)    Headache    History- pt states these were migraines that occurred in the 1980's   Heart murmur    History of kidney stones    Hx of adenomatous colonic polyps    Hypertension    PONV (postoperative nausea and vomiting)    pt states he has  never had post op nausea or vomiting   Portal hypertensive gastropathy ([facility])    Renal cyst, right    Seizures ([facility])    HYPOGLYCEMIC LAST 1 AND 1/2 YRS AGO   Sleep apnea    uses CPAP nightly   Testicle trouble    one testicle BORN WITH   Tubular adenoma of colon     Past Surgical History:   Procedure Laterality Date   BACK SURGERY     89  LOWER    CARPAL TUNNEL RELEASE Right 10/13/2015   Procedure: RIGHT CARPAL TUNNEL RELEASE;  Surgeon: Daryll Brod, MD;  Location: [facility];  Service: Orthopedics;  Laterality: Right;   CARPAL TUNNEL RELEASE Left 07/05/2016   Procedure: LEFT CARPAL TUNNEL RELEASE;  Surgeon: Daryll Brod, MD;  Location: [facility];  Service: Orthopedics;  Laterality: Left;   DRUG INDUCED ENDOSCOPY N/A 07/02/2021   Procedure: DRUG INDUCED SLEEP ENDOSCOPY;  Surgeon: Melida Quitter, MD;  Location: [facility];  Service: ENT;  Laterality: N/A;   IMPLANTATION OF HYPOGLOSSAL NERVE STIMULATOR Right 10/01/2021   Procedure: IMPLANTATION OF HYPOGLOSSAL NERVE STIMULATOR;  Surgeon: Melida Quitter, MD;  Location: [facility];  Service: ENT;  Laterality: Right;   INGUINAL HERNIA REPAIR  2003   right    KNEE ARTHROSCOPY Right 03/03/2016   LUMBAR DISC SURGERY  03/1995   Dr. Coralyn Mark, discectomy   LUMBAR LAMINECTOMY/DECOMPRESSION MICRODISCECTOMY Right 10/06/2016   Procedure: RIGHT LUMBAR THREE - LUMBAR FOUR  LAMINECTOMY, FORAMINOTOMY AND MICRODISCECTOMY;  Surgeon: Jovita Gamma, MD;  Location: [facility];  Service: Neurosurgery;  Laterality: Right;   SHOULDER SURGERY  11/28/2005   left partial   SHOULDER SURGERY  07/14/2006   RIGHT   TONSILLECTOMY  AGE 57 OR 4   TOTAL KNEE ARTHROPLASTY Right 03/07/2022   Procedure: RIGHT TOTAL KNEE ARTHROPLASTY;  Surgeon: Frederik Pear, MD;  Location: [facility];  Service: Orthopedics;  Laterality: Right;   TOTAL SHOULDER ARTHROPLASTY Right 11/01/2018   Procedure: RIGHT SHOULDER REVISION TO REVERSE TOTAL SHOULDER;  Surgeon: Tania Ade, MD;  Location: [facility];  Service: Orthopedics;  Laterality: Right;  CHOICE ANESTHESIA WITH INTERSCALENE BLOCK EXPAREL, NEEDS RNFA    No current facility-administered medications for this encounter.   Current Outpatient Medications  Medication Sig Dispense Refill Last Dose   acetaminophen (TYLENOL)  500 MG tablet Take 1,000 mg by mouth every 6 (six) hours as needed for moderate pain.      apixaban (ELIQUIS) 5 MG TABS tablet Take 5 mg by mouth 2 (two) times daily.      atorvastatin (LIPITOR) 40 MG tablet TAKE 1 TABLET BY MOUTH DAILY (Patient taking differently: Take 40 mg by mouth at bedtime.) 90 tablet 3    b complex vitamins tablet Take 1 tablet by mouth daily.      Cholecalciferol (DIALYVITE VITAMIN D 5000) 125 MCG (5000 UT) capsule Take 5,000 Units by mouth daily.      Chromium Picolinate (CHROMIUM PICOLATE PO) Take 1 tablet by mouth daily.      CINNAMON PO Take 1,080 mg by mouth daily.      Coenzyme Q10 (COQ10) 100 MG CAPS Take 100 mg by mouth daily.      diltiazem (CARDIZEM) 30 MG tablet TAKE 1 TABLET BY MOUTH  TWICE DAILY 180 tablet 3    furosemide (LASIX) 20 MG tablet TAKE 1 AND 1/2 TABLETS BY MOUTH  DAILY (Patient taking differently: Take 20 mg by mouth daily.) 135 tablet 3    Glucagon, rDNA, (GLUCAGON EMERGENCY)  1 MG KIT INJECT AS DIRECTED INTO  UPPER ARM, THIGH OR  BUTTOCKS AS NEEDED FOR  SEVERE HYPOGLYCEMIA. SEEK  MEDICAL ATTENTION AFTER USE (Patient taking differently: Inject 1 mg into the muscle as needed (HYPOGLYCEMIA).) 3 kit 0    Glucosamine-Chondroit-Vit C-Mn (GLUCOSAMINE 1500 COMPLEX) CAPS Take 2 capsules by mouth daily.      insulin lispro (HUMALOG) 100 UNIT/ML injection INJECT SUBCUTANEOUSLY 20 TO 50  UNITS 3 TIMES DAILY BEFORE MEALS PER SLIDING SCALE (Patient taking differently: Inject 10-30 Units into the skin See admin instructions. Inject 10-30 units up to 7 times daily as needed for high blood sugar) 140 mL 3    Magnesium 250 MG TABS Take 250 mg by mouth daily.      METAMUCIL FIBER PO Take 1 capsule by mouth 2 (two) times daily.      montelukast (SINGULAIR) 10 MG tablet TAKE 1 TABLET BY MOUTH AT  BEDTIME 90 tablet 3    Multiple Vitamins-Minerals (EYE HEALTH) CAPS Take 1 capsule by mouth daily.      oxyCODONE-acetaminophen (PERCOCET/ROXICET) 5-325 MG tablet Take 1 tablet  by mouth at bedtime as needed for pain.      pantoprazole (PROTONIX) 40 MG tablet Take 1 tablet (40 mg total) by mouth 2 (two) times daily before a meal. NEEDS OFFICE VISIT FOR ADDITIONAL REFILLS 180 tablet 0    tiZANidine (ZANAFLEX) 2 MG tablet Take 2 mg by mouth every 8 (eight) hours as needed for muscle spasms.      TOUJEO MAX SOLOSTAR 300 UNIT/ML Solostar Pen INJECT SUBCUTANEOUSLY 110 UNITS  DAILY (Patient taking differently: Inject 70 Units into the skin at bedtime.) 36 mL 3    Turmeric Curcumin 500 MG CAPS Take 500 mg by mouth daily.      Blood Glucose Monitoring Suppl (CONTOUR NEXT ONE) KIT Test BS QID and as needed Dx E11.9 500 kit 3    Continuous Blood Gluc Receiver (FREESTYLE LIBRE READER) DEVI 1 applicator by Does not apply route as directed. 1 Device 1    Continuous Blood Gluc Sensor (FREESTYLE LIBRE SENSOR SYSTEM) MISC Check BS eight (8) times a day. Dx E10.9 3 each 3    glucose blood (CONTOUR NEXT TEST) test strip Test BS QID and as needed Dx E11.9 400 strip 3    Microlet Lancets MISC Test BS QID and as needed Dx E11.9 500 each 3    Allergies  Allergen Reactions   Dimetapp Children's Cold-Cough     Chest discomfort    Erythromycin Diarrhea   Sulfonamide Derivatives Diarrhea    Social History   Tobacco Use   Smoking status: Former    Years: 34.00    Types: Cigarettes, Pipe    Quit date: 2005    Years since quitting: 18.7   Smokeless tobacco: Never   Tobacco comments:    quit 2005 smoked cigarettes for 5 yrs prior to pipe use  Substance Use Topics   Alcohol use: Not Currently    Family History  Problem Relation Age of Onset   Heart failure Mother    Heart attack Mother        in her 39's   Alzheimer's disease Mother    Diabetes Mother    Asthma Father    Suicidality Father        in his 30's   Allergic rhinitis Sister    Heart failure Maternal Grandmother    Rheum arthritis Maternal Grandmother    Breast cancer Maternal Grandmother    Heart  attack Maternal  Grandmother        in her 35's   Colon cancer Neg Hx    Esophageal cancer Neg Hx    Pancreatic cancer Neg Hx    Liver disease Neg Hx      Review of Systems  Constitutional: Negative.   HENT: Negative.    Eyes: Negative.   Respiratory: Negative.    Cardiovascular:  Positive for leg swelling.  Gastrointestinal: Negative.   Endocrine: Negative.   Genitourinary: Negative.   Musculoskeletal:  Positive for arthralgias.  Allergic/Immunologic: Negative.   Neurological:  Positive for weakness.  Hematological: Negative.   Psychiatric/Behavioral: Negative.      Objective:  Physical Exam Constitutional:      Appearance: Normal appearance. He is normal weight.  HENT:     Head: Normocephalic and atraumatic.     Nose: Nose normal.     Mouth/Throat:     Mouth: Mucous membranes are moist.     Pharynx: Oropharynx is clear.  Eyes:     Pupils: Pupils are equal, round, and reactive to light.  Cardiovascular:     Pulses: Normal pulses.  Pulmonary:     Effort: Pulmonary effort is normal.  Musculoskeletal:        General: Tenderness present.     Cervical back: Normal range of motion and neck supple.     Comments: Surgical scar is healed there is no effusion collateral stable is intact.  Range of motion is 0/120 with an ouch.  He walks with a left-sided antalgic gait.    Skin:    General: Skin is warm and dry.  Neurological:     General: No focal deficit present.     Mental Status: He is alert and oriented to person, place, and time. Mental status is at baseline.  Psychiatric:        Mood and Affect: Mood normal.        Behavior: Behavior normal.        Thought Content: Thought content normal.        Judgment: Judgment normal.     Vital signs in last 24 hours:    Labs:   Estimated body mass index is 34.27 kg/m as calculated from the following:   Height as of 05/31/22: 5' 8"  (1.727 m).   Weight as of 05/31/22: 102.2 kg.   Imaging Review Plain radiographs demonstrate   deformity with bone-on-bone arthritic changes on the AP and [place] views.      Assessment/Plan:  End stage arthritis, left knee   The patient history, physical examination, clinical judgment of the provider and imaging studies are consistent with end stage degenerative joint disease of the left knee(s) and total knee arthroplasty is deemed medically necessary. The treatment options including medical management, injection therapy arthroscopy and arthroplasty were discussed at length. The risks and benefits of total knee arthroplasty were presented and reviewed. The risks due to aseptic loosening, infection, stiffness, patella tracking problems, thromboembolic complications and other imponderables were discussed. The patient acknowledged the explanation, agreed to proceed with the plan and consent was signed. Patient is being admitted for inpatient treatment for surgery, pain control, PT, OT, prophylactic antibiotics, VTE prophylaxis, progressive ambulation and ADL's and discharge planning. The patient is planning to be discharged home with home health services     Patient's anticipated LOS is less than 2 midnights, meeting these requirements: - Younger than 43 - Lives within 1 hour of care - Has a competent adult at home to recover with  post-op recover - NO history of  - Chronic pain requiring opiods  - Diabetes  - Coronary Artery Disease  - Heart failure  - Heart attack  - Stroke  - DVT/VTE  - Cardiac arrhythmia  - Respiratory Failure/COPD  - Renal failure  - Anemia  - Advanced Liver disease

## 2022-06-04 DIAGNOSIS — E1122 Type 2 diabetes mellitus with diabetic chronic kidney disease: Secondary | ICD-10-CM | POA: Diagnosis not present

## 2022-06-04 DIAGNOSIS — Z794 Long term (current) use of insulin: Secondary | ICD-10-CM | POA: Diagnosis not present

## 2022-06-04 DIAGNOSIS — N182 Chronic kidney disease, stage 2 (mild): Secondary | ICD-10-CM | POA: Diagnosis not present

## 2022-06-05 MED ORDER — TRANEXAMIC ACID 1000 MG/10ML IV SOLN
2000.0000 mg | INTRAVENOUS | Status: DC
  Filled 2022-06-05 (×2): qty 20

## 2022-06-06 ENCOUNTER — Encounter (HOSPITAL_COMMUNITY)
Admission: RE | Disposition: A | Discharge status: Home Health | Payer: Self-pay | Source: Ambulatory Visit | Attending: Orthopedic Surgery

## 2022-06-06 ENCOUNTER — Other Ambulatory Visit: Payer: Self-pay

## 2022-06-06 ENCOUNTER — Observation Stay (HOSPITAL_COMMUNITY)
Admission: RE | Admit: 2022-06-06 | Discharge: 2022-06-07 | Disposition: A | Discharge status: Home Health | Payer: BC Managed Care – PPO | Source: Ambulatory Visit | Attending: Orthopedic Surgery | Admitting: Orthopedic Surgery

## 2022-06-06 ENCOUNTER — Encounter (HOSPITAL_COMMUNITY): Payer: Self-pay | Admitting: Orthopedic Surgery

## 2022-06-06 ENCOUNTER — Ambulatory Visit (HOSPITAL_COMMUNITY): Payer: BC Managed Care – PPO | Admitting: Physician Assistant

## 2022-06-06 ENCOUNTER — Ambulatory Visit (HOSPITAL_COMMUNITY): Payer: BC Managed Care – PPO | Admitting: Registered Nurse

## 2022-06-06 DIAGNOSIS — S83142A Lateral subluxation of proximal end of tibia, left knee, initial encounter: Secondary | ICD-10-CM | POA: Diagnosis not present

## 2022-06-06 DIAGNOSIS — G8918 Other acute postprocedural pain: Secondary | ICD-10-CM | POA: Diagnosis not present

## 2022-06-06 DIAGNOSIS — E119 Type 2 diabetes mellitus without complications: Secondary | ICD-10-CM | POA: Insufficient documentation

## 2022-06-06 DIAGNOSIS — Z96652 Presence of left artificial knee joint: Secondary | ICD-10-CM | POA: Diagnosis not present

## 2022-06-06 DIAGNOSIS — Z87891 Personal history of nicotine dependence: Secondary | ICD-10-CM | POA: Diagnosis not present

## 2022-06-06 DIAGNOSIS — J45909 Unspecified asthma, uncomplicated: Secondary | ICD-10-CM | POA: Insufficient documentation

## 2022-06-06 DIAGNOSIS — M1712 Unilateral primary osteoarthritis, left knee: Secondary | ICD-10-CM | POA: Diagnosis not present

## 2022-06-06 DIAGNOSIS — M21162 Varus deformity, not elsewhere classified, left knee: Secondary | ICD-10-CM | POA: Diagnosis not present

## 2022-06-06 DIAGNOSIS — I10 Essential (primary) hypertension: Secondary | ICD-10-CM | POA: Insufficient documentation

## 2022-06-06 DIAGNOSIS — Z96611 Presence of right artificial shoulder joint: Secondary | ICD-10-CM | POA: Diagnosis not present

## 2022-06-06 DIAGNOSIS — Z96651 Presence of right artificial knee joint: Secondary | ICD-10-CM | POA: Insufficient documentation

## 2022-06-06 DIAGNOSIS — J449 Chronic obstructive pulmonary disease, unspecified: Secondary | ICD-10-CM | POA: Diagnosis not present

## 2022-06-06 DIAGNOSIS — Z7901 Long term (current) use of anticoagulants: Secondary | ICD-10-CM | POA: Insufficient documentation

## 2022-06-06 DIAGNOSIS — I480 Paroxysmal atrial fibrillation: Secondary | ICD-10-CM | POA: Diagnosis not present

## 2022-06-06 DIAGNOSIS — Z79899 Other long term (current) drug therapy: Secondary | ICD-10-CM | POA: Diagnosis not present

## 2022-06-06 DIAGNOSIS — Z794 Long term (current) use of insulin: Secondary | ICD-10-CM | POA: Diagnosis not present

## 2022-06-06 DIAGNOSIS — G4730 Sleep apnea, unspecified: Secondary | ICD-10-CM | POA: Diagnosis not present

## 2022-06-06 HISTORY — PX: TOTAL KNEE ARTHROPLASTY: SHX125

## 2022-06-06 LAB — GLUCOSE, CAPILLARY
Glucose-Capillary: 139 mg/dL — ABNORMAL HIGH (ref 70–99)
Glucose-Capillary: 152 mg/dL — ABNORMAL HIGH (ref 70–99)
Glucose-Capillary: 229 mg/dL — ABNORMAL HIGH (ref 70–99)
Glucose-Capillary: 219 mg/dL — ABNORMAL HIGH (ref 70–99)

## 2022-06-06 SURGERY — ARTHROPLASTY, KNEE, TOTAL
Anesthesia: Regional | Site: Knee | Laterality: Left

## 2022-06-06 MED ORDER — DOCUSATE SODIUM 100 MG PO CAPS
100.0000 mg | ORAL_CAPSULE | Freq: Two times a day (BID) | ORAL | Status: DC
  Administered 2022-06-06: 100 mg via ORAL
  Filled 2022-06-06 (×2): qty 1

## 2022-06-06 MED ORDER — DEXAMETHASONE SODIUM PHOSPHATE 10 MG/ML IJ SOLN
INTRAMUSCULAR | Status: AC
  Filled 2022-06-06: qty 1

## 2022-06-06 MED ORDER — EYE HEALTH PO CAPS: 1.0000 | ORAL_CAPSULE | Freq: Every day | ORAL | Status: DC

## 2022-06-06 MED ORDER — INSULIN ASPART 100 UNIT/ML IJ SOLN: 0.0000 [IU] | Freq: Three times a day (TID) | INTRAMUSCULAR | Status: DC

## 2022-06-06 MED ORDER — HYDROMORPHONE HCL 1 MG/ML IJ SOLN: 0.5000 mg | INTRAMUSCULAR | Status: DC | PRN

## 2022-06-06 MED ORDER — DEXAMETHASONE SODIUM PHOSPHATE 10 MG/ML IJ SOLN
10.0000 mg | Freq: Once | INTRAMUSCULAR | Status: DC
  Filled 2022-06-06: qty 1

## 2022-06-06 MED ORDER — B COMPLEX PO TABS: 1.0000 | ORAL_TABLET | Freq: Every day | ORAL | Status: DC

## 2022-06-06 MED ORDER — VITAMIN D 25 MCG (1000 UNIT) PO TABS
5000.0000 [IU] | ORAL_TABLET | Freq: Every day | ORAL | Status: DC
  Filled 2022-06-06: qty 5

## 2022-06-06 MED ORDER — ACETAMINOPHEN 160 MG/5ML PO SOLN: 1000.0000 mg | Freq: Once | ORAL | Status: DC | PRN

## 2022-06-06 MED ORDER — PANTOPRAZOLE SODIUM 40 MG PO TBEC
40.0000 mg | DELAYED_RELEASE_TABLET | Freq: Two times a day (BID) | ORAL | Status: DC
  Administered 2022-06-06 – 2022-06-07 (×2): 40 mg via ORAL
  Filled 2022-06-06 (×2): qty 1

## 2022-06-06 MED ORDER — OXYCODONE HCL 5 MG/5ML PO SOLN: 5.0000 mg | Freq: Once | ORAL | Status: AC | PRN

## 2022-06-06 MED ORDER — FLEET ENEMA 7-19 GM/118ML RE ENEM: 1.0000 | ENEMA | Freq: Once | RECTAL | Status: DC | PRN

## 2022-06-06 MED ORDER — PHENOL 1.4 % MT LIQD: 1.0000 | OROMUCOSAL | Status: DC | PRN

## 2022-06-06 MED ORDER — POLYETHYLENE GLYCOL 3350 17 G PO PACK: 17.0000 g | PACK | Freq: Every day | ORAL | Status: DC | PRN

## 2022-06-06 MED ORDER — PROPOFOL 1000 MG/100ML IV EMUL
INTRAVENOUS | Status: AC
  Filled 2022-06-06: qty 100

## 2022-06-06 MED ORDER — SODIUM CHLORIDE (PF) 0.9 % IJ SOLN
INTRAMUSCULAR | Status: DC | PRN
  Administered 2022-06-06: 70 mL

## 2022-06-06 MED ORDER — COQ10 100 MG PO CAPS: 100.0000 mg | ORAL_CAPSULE | Freq: Every day | ORAL | Status: DC

## 2022-06-06 MED ORDER — GLUCOSAMINE 1500 COMPLEX PO CAPS: 2.0000 | ORAL_CAPSULE | Freq: Every day | ORAL | Status: DC

## 2022-06-06 MED ORDER — OXYCODONE HCL 5 MG PO TABS: 5.0000 mg | ORAL_TABLET | ORAL | 0 refills | Status: AC | PRN

## 2022-06-06 MED ORDER — PROPOFOL 10 MG/ML IV BOLUS
INTRAVENOUS | Status: AC
  Filled 2022-06-06: qty 20

## 2022-06-06 MED ORDER — ONDANSETRON HCL 4 MG/2ML IJ SOLN: 4.0000 mg | Freq: Four times a day (QID) | INTRAMUSCULAR | Status: DC | PRN

## 2022-06-06 MED ORDER — MIDAZOLAM HCL 5 MG/5ML IJ SOLN
INTRAMUSCULAR | Status: DC | PRN
  Administered 2022-06-06: 2 mg via INTRAVENOUS

## 2022-06-06 MED ORDER — CHLORHEXIDINE GLUCONATE 0.12 % MT SOLN
15.0000 mL | Freq: Once | OROMUCOSAL | Status: AC
  Administered 2022-06-06: 15 mL via OROMUCOSAL

## 2022-06-06 MED ORDER — APIXABAN 2.5 MG PO TABS: 2.5000 mg | ORAL_TABLET | Freq: Two times a day (BID) | ORAL | 0 refills | Status: DC

## 2022-06-06 MED ORDER — ACETAMINOPHEN 500 MG PO TABS: 1000.0000 mg | ORAL_TABLET | Freq: Four times a day (QID) | ORAL | Status: DC | PRN

## 2022-06-06 MED ORDER — APIXABAN 2.5 MG PO TABS
2.5000 mg | ORAL_TABLET | Freq: Two times a day (BID) | ORAL | Status: DC
  Administered 2022-06-07: 2.5 mg via ORAL
  Filled 2022-06-06: qty 1

## 2022-06-06 MED ORDER — MAGNESIUM 250 MG PO TABS: 250.0000 mg | ORAL_TABLET | Freq: Every day | ORAL | Status: DC

## 2022-06-06 MED ORDER — FUROSEMIDE 20 MG PO TABS
20.0000 mg | ORAL_TABLET | Freq: Every day | ORAL | Status: DC
  Filled 2022-06-06: qty 1

## 2022-06-06 MED ORDER — INSULIN GLARGINE-YFGN 100 UNIT/ML ~~LOC~~ SOLN
70.0000 [IU] | Freq: Every day | SUBCUTANEOUS | Status: DC
  Filled 2022-06-06 (×2): qty 0.7

## 2022-06-06 MED ORDER — KCL IN DEXTROSE-NACL 20-5-0.45 MEQ/L-%-% IV SOLN
INTRAVENOUS | Status: DC
  Filled 2022-06-06 (×2): qty 1000

## 2022-06-06 MED ORDER — BUPIVACAINE-EPINEPHRINE (PF) 0.5% -1:200000 IJ SOLN
INTRAMUSCULAR | Status: DC | PRN
  Administered 2022-06-06: 20 mL via PERINEURAL

## 2022-06-06 MED ORDER — BUPIVACAINE LIPOSOME 1.3 % IJ SUSP: 20.0000 mL | Freq: Once | INTRAMUSCULAR | Status: DC

## 2022-06-06 MED ORDER — SODIUM CHLORIDE (PF) 0.9 % IJ SOLN
INTRAMUSCULAR | Status: AC
  Filled 2022-06-06: qty 100

## 2022-06-06 MED ORDER — TRANEXAMIC ACID 1000 MG/10ML IV SOLN
INTRAVENOUS | Status: DC | PRN
  Administered 2022-06-06: 2000 mg via TOPICAL

## 2022-06-06 MED ORDER — SODIUM CHLORIDE (PF) 0.9 % IJ SOLN
INTRAMUSCULAR | Status: AC
  Filled 2022-06-06: qty 20

## 2022-06-06 MED ORDER — SODIUM CHLORIDE 0.9 % IR SOLN
Status: DC | PRN
  Administered 2022-06-06: 1

## 2022-06-06 MED ORDER — FENTANYL CITRATE PF 50 MCG/ML IJ SOSY
25.0000 ug | PREFILLED_SYRINGE | INTRAMUSCULAR | Status: DC | PRN
  Administered 2022-06-06: 50 ug via INTRAVENOUS

## 2022-06-06 MED ORDER — LACTATED RINGERS IV SOLN: INTRAVENOUS | Status: DC

## 2022-06-06 MED ORDER — ORAL CARE MOUTH RINSE: 15.0000 mL | Freq: Once | OROMUCOSAL | Status: AC

## 2022-06-06 MED ORDER — LACTATED RINGERS IV BOLUS
500.0000 mL | Freq: Once | INTRAVENOUS | Status: AC
  Administered 2022-06-06: 500 mL via INTRAVENOUS

## 2022-06-06 MED ORDER — ATORVASTATIN CALCIUM 40 MG PO TABS
40.0000 mg | ORAL_TABLET | Freq: Every day | ORAL | Status: DC
  Administered 2022-06-06: 40 mg via ORAL
  Filled 2022-06-06: qty 1

## 2022-06-06 MED ORDER — ONDANSETRON HCL 4 MG/2ML IJ SOLN
INTRAMUSCULAR | Status: AC
  Filled 2022-06-06: qty 2

## 2022-06-06 MED ORDER — FENTANYL CITRATE (PF) 100 MCG/2ML IJ SOLN
INTRAMUSCULAR | Status: DC | PRN
  Administered 2022-06-06 (×4): 50 ug via INTRAVENOUS

## 2022-06-06 MED ORDER — ACETAMINOPHEN 500 MG PO TABS: 1000.0000 mg | ORAL_TABLET | Freq: Once | ORAL | Status: DC | PRN

## 2022-06-06 MED ORDER — DILTIAZEM HCL 30 MG PO TABS
30.0000 mg | ORAL_TABLET | Freq: Two times a day (BID) | ORAL | Status: DC
  Administered 2022-06-06 – 2022-06-07 (×2): 30 mg via ORAL
  Filled 2022-06-06 (×2): qty 1

## 2022-06-06 MED ORDER — POVIDONE-IODINE 10 % EX SWAB
2.0000 | Freq: Once | CUTANEOUS | Status: AC
  Administered 2022-06-06: 2 via TOPICAL

## 2022-06-06 MED ORDER — BUPIVACAINE LIPOSOME 1.3 % IJ SUSP
INTRAMUSCULAR | Status: AC
  Filled 2022-06-06: qty 20

## 2022-06-06 MED ORDER — TRANEXAMIC ACID-NACL 1000-0.7 MG/100ML-% IV SOLN
1000.0000 mg | INTRAVENOUS | Status: AC
  Administered 2022-06-06: 1000 mg via INTRAVENOUS
  Filled 2022-06-06: qty 100

## 2022-06-06 MED ORDER — BUPIVACAINE-EPINEPHRINE (PF) 0.25% -1:200000 IJ SOLN
INTRAMUSCULAR | Status: AC
  Filled 2022-06-06: qty 30

## 2022-06-06 MED ORDER — LACTATED RINGERS IV SOLN
INTRAVENOUS | Status: DC
  Administered 2022-06-06: 1000 mL via INTRAVENOUS

## 2022-06-06 MED ORDER — OXYCODONE HCL 5 MG PO TABS
5.0000 mg | ORAL_TABLET | Freq: Once | ORAL | Status: AC | PRN
  Administered 2022-06-06: 5 mg via ORAL

## 2022-06-06 MED ORDER — BUPIVACAINE LIPOSOME 1.3 % IJ SUSP
INTRAMUSCULAR | Status: DC | PRN
  Administered 2022-06-06: 20 mL

## 2022-06-06 MED ORDER — INSULIN GLARGINE 100 UNIT/ML ~~LOC~~ SOLN
70.0000 [IU] | Freq: Every day | SUBCUTANEOUS | Status: DC
  Filled 2022-06-06: qty 0.7

## 2022-06-06 MED ORDER — FENTANYL CITRATE (PF) 100 MCG/2ML IJ SOLN
INTRAMUSCULAR | Status: AC
  Filled 2022-06-06: qty 2

## 2022-06-06 MED ORDER — OXYCODONE HCL 5 MG PO TABS: 5.0000 mg | ORAL_TABLET | ORAL | Status: DC | PRN

## 2022-06-06 MED ORDER — TRANEXAMIC ACID-NACL 1000-0.7 MG/100ML-% IV SOLN
INTRAVENOUS | Status: AC
  Filled 2022-06-06: qty 100

## 2022-06-06 MED ORDER — ACETAMINOPHEN 10 MG/ML IV SOLN
INTRAVENOUS | Status: AC
  Filled 2022-06-06: qty 100

## 2022-06-06 MED ORDER — MIDAZOLAM HCL 2 MG/2ML IJ SOLN
INTRAMUSCULAR | Status: AC
  Filled 2022-06-06: qty 2

## 2022-06-06 MED ORDER — PHENYLEPHRINE 80 MCG/ML (10ML) SYRINGE FOR IV PUSH (FOR BLOOD PRESSURE SUPPORT)
PREFILLED_SYRINGE | INTRAVENOUS | Status: DC | PRN
  Administered 2022-06-06: 160 ug via INTRAVENOUS
  Administered 2022-06-06: 80 ug via INTRAVENOUS
  Administered 2022-06-06 (×2): 160 ug via INTRAVENOUS
  Administered 2022-06-06: 80 ug via INTRAVENOUS

## 2022-06-06 MED ORDER — METOCLOPRAMIDE HCL 5 MG PO TABS: 5.0000 mg | ORAL_TABLET | Freq: Three times a day (TID) | ORAL | Status: DC | PRN

## 2022-06-06 MED ORDER — BUPIVACAINE-EPINEPHRINE (PF) 0.25% -1:200000 IJ SOLN
INTRAMUSCULAR | Status: DC | PRN
  Administered 2022-06-06: 30 mL via PERINEURAL

## 2022-06-06 MED ORDER — WATER FOR IRRIGATION, STERILE IR SOLN
Status: DC | PRN
  Administered 2022-06-06: 2000 mL

## 2022-06-06 MED ORDER — ALUM & MAG HYDROXIDE-SIMETH 200-200-20 MG/5ML PO SUSP: 30.0000 mL | ORAL | Status: DC | PRN

## 2022-06-06 MED ORDER — FENTANYL CITRATE PF 50 MCG/ML IJ SOSY
PREFILLED_SYRINGE | INTRAMUSCULAR | Status: AC
  Filled 2022-06-06: qty 1

## 2022-06-06 MED ORDER — MONTELUKAST SODIUM 10 MG PO TABS
10.0000 mg | ORAL_TABLET | Freq: Every day | ORAL | Status: DC
  Administered 2022-06-06: 10 mg via ORAL
  Filled 2022-06-06: qty 1

## 2022-06-06 MED ORDER — MENTHOL 3 MG MT LOZG: 1.0000 | LOZENGE | OROMUCOSAL | Status: DC | PRN

## 2022-06-06 MED ORDER — OXYCODONE HCL 5 MG PO TABS
ORAL_TABLET | ORAL | Status: AC
  Filled 2022-06-06: qty 1

## 2022-06-06 MED ORDER — TIZANIDINE HCL 4 MG PO TABS
2.0000 mg | ORAL_TABLET | Freq: Three times a day (TID) | ORAL | Status: DC | PRN
  Administered 2022-06-06 – 2022-06-07 (×2): 2 mg via ORAL
  Filled 2022-06-06 (×2): qty 1

## 2022-06-06 MED ORDER — METOCLOPRAMIDE HCL 5 MG/ML IJ SOLN: 5.0000 mg | Freq: Three times a day (TID) | INTRAMUSCULAR | Status: DC | PRN

## 2022-06-06 MED ORDER — ONDANSETRON HCL 4 MG PO TABS: 4.0000 mg | ORAL_TABLET | Freq: Four times a day (QID) | ORAL | Status: DC | PRN

## 2022-06-06 MED ORDER — PHENYLEPHRINE HCL-NACL 20-0.9 MG/250ML-% IV SOLN
INTRAVENOUS | Status: AC
  Filled 2022-06-06: qty 250

## 2022-06-06 MED ORDER — PHENYLEPHRINE 80 MCG/ML (10ML) SYRINGE FOR IV PUSH (FOR BLOOD PRESSURE SUPPORT)
PREFILLED_SYRINGE | INTRAVENOUS | Status: AC
  Filled 2022-06-06: qty 10

## 2022-06-06 MED ORDER — ACETAMINOPHEN 10 MG/ML IV SOLN
1000.0000 mg | Freq: Once | INTRAVENOUS | Status: DC | PRN
  Administered 2022-06-06: 1000 mg via INTRAVENOUS

## 2022-06-06 MED ORDER — DEXAMETHASONE SODIUM PHOSPHATE 10 MG/ML IJ SOLN
INTRAMUSCULAR | Status: DC | PRN
  Administered 2022-06-06: 10 mg via INTRAVENOUS

## 2022-06-06 MED ORDER — TRANEXAMIC ACID-NACL 1000-0.7 MG/100ML-% IV SOLN
1000.0000 mg | Freq: Once | INTRAVENOUS | Status: AC
  Administered 2022-06-06: 1000 mg via INTRAVENOUS

## 2022-06-06 MED ORDER — BISACODYL 5 MG PO TBEC: 5.0000 mg | DELAYED_RELEASE_TABLET | Freq: Every day | ORAL | Status: DC | PRN

## 2022-06-06 MED ORDER — LACTATED RINGERS IV BOLUS
250.0000 mL | Freq: Once | INTRAVENOUS | Status: AC
  Administered 2022-06-06: 250 mL via INTRAVENOUS

## 2022-06-06 MED ORDER — CEFAZOLIN SODIUM-DEXTROSE 2-4 GM/100ML-% IV SOLN
2.0000 g | INTRAVENOUS | Status: AC
  Administered 2022-06-06: 2 g via INTRAVENOUS
  Filled 2022-06-06: qty 100

## 2022-06-06 MED ORDER — PROPOFOL 10 MG/ML IV BOLUS
INTRAVENOUS | Status: DC | PRN
  Administered 2022-06-06 (×2): 20 mg via INTRAVENOUS
  Administered 2022-06-06: 130 mg via INTRAVENOUS

## 2022-06-06 MED ORDER — 0.9 % SODIUM CHLORIDE (POUR BTL) OPTIME
TOPICAL | Status: DC | PRN
  Administered 2022-06-06: 1000 mL

## 2022-06-06 MED ORDER — FREESTYLE LIBRE READER DEVI: 1.0000 | Status: DC

## 2022-06-06 MED ORDER — ONDANSETRON HCL 4 MG/2ML IJ SOLN
INTRAMUSCULAR | Status: DC | PRN
  Administered 2022-06-06: 4 mg via INTRAVENOUS

## 2022-06-06 SURGICAL SUPPLY — 46 items
ATTUNE MED DOME PAT 41 KNEE (Knees) IMPLANT
ATTUNE PS FEM LT SZ 7 CEM KNEE (Femur) IMPLANT
ATTUNE PSRP INSR SZ7 6 KNEE (Insert) IMPLANT
BAG COUNTER SPONGE SURGICOUNT (BAG) IMPLANT
BAG DECANTER FOR FLEXI CONT (MISCELLANEOUS) ×1 IMPLANT
BAG ZIPLOCK 12X15 (MISCELLANEOUS) ×1 IMPLANT
BASE TIBIAL ROT PLAT SZ 8 KNEE (Knees) IMPLANT
BLADE SAG 18X100X1.27 (BLADE) ×1 IMPLANT
BLADE SAW SGTL 11.0X1.19X90.0M (BLADE) ×1 IMPLANT
BLADE SURG SZ10 CARB STEEL (BLADE) ×2 IMPLANT
BNDG ELASTIC 6X10 VLCR STRL LF (GAUZE/BANDAGES/DRESSINGS) ×1 IMPLANT
BOWL SMART MIX CTS (DISPOSABLE) ×1 IMPLANT
CEMENT HV SMART SET (Cement) ×2 IMPLANT
COVER SURGICAL LIGHT HANDLE (MISCELLANEOUS) ×1 IMPLANT
DRAPE INCISE IOBAN 66X45 STRL (DRAPES) ×1 IMPLANT
DRAPE U-SHAPE 47X51 STRL (DRAPES) ×1 IMPLANT
DRSG AQUACEL AG ADV 3.5X10 (GAUZE/BANDAGES/DRESSINGS) ×1 IMPLANT
DURAPREP 26ML APPLICATOR (WOUND CARE) ×1 IMPLANT
ELECT REM PT RETURN 15FT ADLT (MISCELLANEOUS) ×1 IMPLANT
GLOVE BIO SURGEON STRL SZ7.5 (GLOVE) ×1 IMPLANT
GLOVE BIO SURGEON STRL SZ8.5 (GLOVE) ×1 IMPLANT
GLOVE BIOGEL PI IND STRL 8 (GLOVE) ×1 IMPLANT
GLOVE BIOGEL PI IND STRL 9 (GLOVE) ×1 IMPLANT
GOWN STRL REUS W/ TWL XL LVL3 (GOWN DISPOSABLE) ×2 IMPLANT
GOWN STRL REUS W/TWL XL LVL3 (GOWN DISPOSABLE) ×2
HANDPIECE INTERPULSE COAX TIP (DISPOSABLE) ×1
HOOD PEEL AWAY FLYTE STAYCOOL (MISCELLANEOUS) ×3 IMPLANT
KIT TURNOVER KIT A (KITS) IMPLANT
NDL HYPO 21X1.5 SAFETY (NEEDLE) ×2 IMPLANT
NEEDLE HYPO 21X1.5 SAFETY (NEEDLE) ×2 IMPLANT
NEEDLE HYPO 22GX1.5 SAFETY (NEEDLE) IMPLANT
NS IRRIG 1000ML POUR BTL (IV SOLUTION) ×1 IMPLANT
PACK TOTAL KNEE CUSTOM (KITS) ×1 IMPLANT
PROTECTOR NERVE ULNAR (MISCELLANEOUS) ×1 IMPLANT
SET HNDPC FAN SPRY TIP SCT (DISPOSABLE) ×1 IMPLANT
SPIKE FLUID TRANSFER (MISCELLANEOUS) ×3 IMPLANT
SUT VIC AB 1 CTX 36 (SUTURE) ×1
SUT VIC AB 1 CTX36XBRD ANBCTR (SUTURE) ×1 IMPLANT
SUT VIC AB 3-0 CT1 27 (SUTURE) ×3
SUT VIC AB 3-0 CT1 TAPERPNT 27 (SUTURE) ×3 IMPLANT
SYR CONTROL 10ML LL (SYRINGE) ×2 IMPLANT
TIBIAL BASE ROT PLAT SZ 8 KNEE (Knees) ×1 IMPLANT
TRAY CATH INTERMITTENT SS 16FR (CATHETERS) IMPLANT
TRAY FOLEY MTR SLVR 16FR STAT (SET/KITS/TRAYS/PACK) ×1 IMPLANT
WATER STERILE IRR 1000ML POUR (IV SOLUTION) ×2 IMPLANT
WRAP KNEE MAXI GEL POST OP (GAUZE/BANDAGES/DRESSINGS) ×1 IMPLANT

## 2022-06-06 NOTE — Anesthesia Procedure Notes (Signed)
Procedure Name: LMA Insertion Date/Time: 06/06/2022 7:27 AM  Performed by: Talbot Grumbling, CRNAPre-anesthesia Checklist: Patient identified, Emergency Drugs available, Suction available and Patient being monitored Patient Re-evaluated:Patient Re-evaluated prior to induction Oxygen Delivery Method: Circle system utilized Preoxygenation: Pre-oxygenation with 100% oxygen Induction Type: IV induction LMA: LMA inserted LMA Size: 5.0 Number of attempts: 1 Placement Confirmation: positive ETCO2 and breath sounds checked- equal and bilateral Tube secured with: Tape Dental Injury: Teeth and Oropharynx as per pre-operative assessment

## 2022-06-06 NOTE — Discharge Instructions (Signed)

## 2022-06-06 NOTE — Transfer of Care (Signed)
Immediate Anesthesia Transfer of Care Note  Patient: DONNEY CARAVEO  Procedure(s) Performed: LEFT TOTAL KNEE ARTHROPLASTY (Left: Knee)  Patient Location: PACU  Anesthesia Type:General  Level of Consciousness: sedated  Airway & Oxygen Therapy: Patient Spontanous Breathing and Patient connected to face mask oxygen  Post-op Assessment: Report given to RN and Post -op Vital signs reviewed and stable  Post vital signs: Reviewed and stable  Last Vitals:  Vitals Value Taken Time  BP 122/71 06/06/22 0930  Temp    Pulse 95 06/06/22 0931  Resp 18 06/06/22 0931  SpO2 100 % 06/06/22 0931  Vitals shown include unvalidated device data.  Last Pain:  Vitals:   06/06/22 0611  TempSrc: Oral         Complications: No notable events documented.

## 2022-06-06 NOTE — Anesthesia Procedure Notes (Signed)
Anesthesia Regional Block: Adductor canal block   Pre-Anesthetic Checklist: , timeout performed,  Correct Patient, Correct Site, Correct Laterality,  Correct Procedure, Correct Position, site marked,  Risks and benefits discussed,  Surgical consent,  Pre-op evaluation,  At surgeon's request and post-op pain management  Laterality: Left and Lower  Prep: chloraprep       Needles:  Injection technique: Single-shot      Needle Length: 9cm  Needle Gauge: 22     Additional Needles: Arrow StimuQuik ECHO Echogenic Stimulating PNB Needle  Procedures:,,,, ultrasound used (permanent image in chart),,    Narrative:  Start time: 06/06/2022 7:13 AM End time: 06/06/2022 7:18 AM Injection made incrementally with aspirations every 5 mL.  Performed by: Personally  Anesthesiologist: Oleta Mouse, MD

## 2022-06-06 NOTE — Anesthesia Postprocedure Evaluation (Signed)
Anesthesia Post Note  Patient: RYKIN ROUTE  Procedure(s) Performed: LEFT TOTAL KNEE ARTHROPLASTY (Left: Knee)     Patient location during evaluation: PACU Anesthesia Type: Regional and General Level of consciousness: awake and alert Pain management: pain level controlled Vital Signs Assessment: post-procedure vital signs reviewed and stable Respiratory status: spontaneous breathing, nonlabored ventilation and respiratory function stable Cardiovascular status: blood pressure returned to baseline and stable Postop Assessment: no apparent nausea or vomiting Anesthetic complications: no   No notable events documented.  Last Vitals:  Vitals:   06/06/22 1045 06/06/22 1100  BP: 137/79 (!) 144/74  Pulse: 73 74  Resp: 15 16  Temp:  36.7 C  SpO2: 98% 91%    Last Pain:  Vitals:   06/06/22 1100  TempSrc:   PainSc: Asleep                 Hayla Hinger

## 2022-06-06 NOTE — Progress Notes (Signed)
Wife had dosed pt with insulin after lunch without letting nurse know; pt's CBG checked and is lower than what pt's wife had checked with her "freestyle"; pt states he does not want any more insulin at present

## 2022-06-06 NOTE — Evaluation (Signed)
Physical Therapy Evaluation Patient Details Name: Tony Cantu MRN: [id number] DOB: [birth year] Today's Date: 06/06/2022  History of Present Illness  Pt is a 71yo male presenting s/p L-TKA on 06/06/22. 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   Clinical Impression  Tony Cantu is a 71 y.o. male POD 0 s/p L-TKA. Patient reports IND with mobility at baseline. Patient is now limited by functional impairments (see PT problem list below) and requires min assist for bed mobility and for transfers; pt with knee buckling during static stance and unable to maintain safely without min assist from PT; returned to supine, further mobility deferred. Patient instructed in exercise to facilitate ROM and circulation to manage edema. Patient will benefit from continued skilled PT interventions to address impairments and progress towards PLOF. Acute PT will follow to progress mobility and stair training in preparation for safe discharge home; will attempt second session for possible same-day discharge depending on pt status and mobility.  Of note: pt's wife dosed pt with insulin she brought from home after checking with "freestyle" blood-glucose monitor without informing nurse despite protestations from this PT; PT notified RN that this had occurred.     Recommendations for follow up therapy are one component of a multi-disciplinary discharge planning process, led by the attending physician.  Recommendations may be updated based on patient status, additional functional criteria and insurance authorization.  Follow Up Recommendations Follow physician's recommendations for discharge plan and follow up therapies      Assistance Recommended at Discharge Frequent or constant Supervision/Assistance  Patient can return home with the following  A little help with walking and/or  transfers;A little help with bathing/dressing/bathroom;Assistance with cooking/housework;Assist for transportation;Help with stairs or ramp for entrance    Equipment Recommendations None recommended by PT  Recommendations for Other Services       Functional Status Assessment Patient has had a recent decline in their functional status and demonstrates the ability to make significant improvements in function in a reasonable and predictable amount of time.     Precautions / Restrictions Precautions Precautions: Knee Precaution Booklet Issued: No Precaution Comments: no pillow under the knee Restrictions Weight Bearing Restrictions: No Other Position/Activity Restrictions: wbat      Mobility  Bed Mobility Overal bed mobility: Needs Assistance Bed Mobility: Supine to Sit, Sit to Supine     Supine to sit: Supervision Sit to supine: Min assist   General bed mobility comments: Supervision for supine to sit for safety only, no physical assist required. Sit to supine: min assist to bring BLE back onto stretcher.    Transfers Overall transfer level: Needs assistance Equipment used: Rolling walker (2 wheels) Transfers: Sit to/from Stand Sit to Stand: Min assist, From elevated surface           General transfer comment: Pt required min assist for sit to stand for lift assistance and steadying once in static standing; pt with bilateral knee buckling and after several attempts to stand discontinued for safety.    Ambulation/Gait               General Gait Details: unsafe at present  Stairs            Wheelchair Mobility    Modified Rankin (Stroke Patients Only)       Balance Overall balance assessment: Needs assistance Sitting-balance support: Feet supported, No upper extremity supported Sitting balance-Leahy Scale:  Good     Standing balance support: Reliant on assistive device for balance, During functional activity, Bilateral upper extremity  supported Standing balance-Leahy Scale: Poor Standing balance comment: Pt unable to stand without at least min assist from PT                             Pertinent Vitals/Pain Pain Assessment Pain Assessment: No/denies pain    Home Living Family/patient expects to be discharged to:: Private residence Living Arrangements: Spouse/significant other Available Help at Discharge: Family;Available 24 hours/day Type of Home: House Home Access: Stairs to enter Entrance Stairs-Rails: None Entrance Stairs-Number of Steps: 1+1   Home Layout: Able to live on main level with bedroom/bathroom Home Equipment: Conservation officer, nature (2 wheels);Standard Walker;Cane - single point;BSC/3in1      Prior Function Prior Level of Function : Independent/Modified Independent;History of Falls (last six months)             Mobility Comments: IND ADLs Comments: IND     Hand Dominance        Extremity/Trunk Assessment   Upper Extremity Assessment Upper Extremity Assessment: Overall WFL for tasks assessed    Lower Extremity Assessment Lower Extremity Assessment: RLE deficits/detail;LLE deficits/detail RLE Deficits / Details: MMT ank DF/PF 5/5 RLE Sensation: WNL LLE Deficits / Details: MMT ank DF/PF 5/5, mild extensor lag noted LLE Sensation: WNL    Cervical / Trunk Assessment Cervical / Trunk Assessment: Kyphotic  Communication   Communication: No difficulties  Cognition Arousal/Alertness: Awake/alert Behavior During Therapy: WFL for tasks assessed/performed Overall Cognitive Status: Within Functional Limits for tasks assessed                                          General Comments General comments (skin integrity, edema, etc.): wife [name] present    Exercises Total Joint Exercises Ankle Circles/Pumps: AROM, Both, 10 reps, Supine   Assessment/Plan    PT Assessment Patient needs continued PT services  PT Problem List Decreased strength;Decreased range of  motion;Decreased activity tolerance;Decreased balance;Decreased mobility;Decreased coordination;Pain       PT Treatment Interventions DME instruction;Gait training;Stair training;Functional mobility training;Therapeutic activities;Therapeutic exercise;Balance training;Patient/family education;Neuromuscular re-education    PT Goals (Current goals can be found in the Care Plan section)  Acute Rehab PT Goals Patient Stated Goal: To walk without pain PT Goal Formulation: With patient Time For Goal Achievement: 06/13/22 Potential to Achieve Goals: Good    Frequency 7X/week     Co-evaluation               AM-PAC PT "6 Clicks" Mobility  Outcome Measure Help needed turning from your back to your side while in a flat bed without using bedrails?: None Help needed moving from lying on your back to sitting on the side of a flat bed without using bedrails?: A Little Help needed moving to and from a bed to a chair (including a wheelchair)?: A Lot Help needed standing up from a chair using your arms (e.g., wheelchair or bedside chair)?: A Lot Help needed to walk in hospital room?: A Lot Help needed climbing 3-5 steps with a railing? : A Lot 6 Click Score: 15    End of Session Equipment Utilized During Treatment: Gait belt Activity Tolerance: Other (comment) (Limited Secondary to anesthesia) Patient left: in bed;with call bell/phone within reach;with family/visitor present Nurse Communication: Mobility status  PT Visit Diagnosis: Pain;Difficulty in walking, not elsewhere classified (R26.2) Pain - Right/Left: Left Pain - part of body: Knee    Time: 7939-0300 PT Time Calculation (min) (ACUTE ONLY): 28 min   Charges:   PT Evaluation $PT Eval Low Complexity: 1 Low          Coolidge Breeze, PT, DPT [facility] Office: [phone] Weekend pager: [phone]  Adonai Selsor 06/06/2022, 2:38 PM

## 2022-06-06 NOTE — Interval H&P Note (Signed)
History and Physical Interval Note:  06/06/2022 6:50 AM  Tony Cantu  has presented today for surgery, with the diagnosis of LEFT KNEE OSTEOARTHRITIS.  The various methods of treatment have been discussed with the patient and family. After consideration of risks, benefits and other options for treatment, the patient has consented to  Procedure(s): LEFT TOTAL KNEE ARTHROPLASTY (Left) as a surgical intervention.  The patient's history has been reviewed, patient examined, no change in status, stable for surgery.  I have reviewed the patient's chart and labs.  Questions were answered to the patient's satisfaction.     Kerin Salen

## 2022-06-06 NOTE — Progress Notes (Signed)
Physical Therapy Treatment Patient Details Name: Tony Cantu MRN: [id number] DOB: 1951-06-19 Today's Date: 06/06/2022     Clinical Impression  Pt seen for second session POD0 to attempt same-day discharge. Pt supine in bed agreeable to be seen reporting no pain. Required min assist for bed mobility and mod assist +2 for transfers secondary to bilateral knee buckling that required PT assistance to correct. Pt did not receive spinal anesthesia so suspect this is secondary to weakness. Patient was able to ambulate 3 feet with RW, heavy BUE use and mod assist for knee buckling that required PT assistance to correct, further mobility deferred.  Patient instructed in exercise to facilitate ROM and circulation to manage edema. Patient will benefit from continued skilled PT interventions to address impairments and progress towards PLOF. Acute PT will follow to progress mobility and stair training in preparation for safe discharge to the appropriate venue.       06/06/22 1628  PT Visit Information  Last PT Received On 06/06/22  Assistance Needed +1  History of Present Illness Pt is a 71yo male presenting s/p L-TKA on 06/06/22. 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/01/2021, Lumbar laminectomy L3-L4 2018, R-TKA 03/07/22, R-TSA 2020, COPD, PAF, HLD, OSA on CPAP  Subjective Data  Subjective Let's try again  Precautions  Precautions Knee  Precaution Booklet Issued No  Precaution Comments no pillow under the knee  Restrictions  Weight Bearing Restrictions No  Other Position/Activity Restrictions wbat  Pain Assessment  Pain Assessment No/denies pain  Cognition  Arousal/Alertness Awake/alert  Behavior During Therapy WFL for tasks assessed/performed  Overall Cognitive Status Within Functional Limits for tasks assessed  Bed Mobility  Overal bed mobility Needs Assistance  Bed Mobility Supine to Sit;Sit to Supine  Supine to sit Supervision  Sit to supine Min  assist  General bed mobility comments Supervision for supine to sit for safety only, no physical assist required. Sit to supine: min assist to bring BLE back onto stretcher.  Transfers  Overall transfer level Needs assistance  Equipment used Rolling walker (2 wheels)  Transfers Sit to/from Stand;Bed to chair/wheelchair/BSC  Sit to Stand Min assist;From elevated surface  Bed to/from chair/wheelchair/BSC transfer type: Step pivot  Step pivot transfers Mod assist;+2 safety/equipment  General transfer comment Sit to Stand: Pt required min assist for sit to stand for lift assistance and steadying once in static standing; pt with bilateral knee buckling and after several attempts to stand. Step pivot: pt required mod assist +2 for safety, PT blocking bilateral knees from buckling, +2 for safety to prevent fall, transfer from stretcher to hospital bed.  Ambulation/Gait  Ambulation/Gait assistance Mod assist  Gait Distance (Feet) 3 Feet  Assistive device Rolling walker (2 wheels)  Gait Pattern/deviations Step-to pattern;Decreased step length - right;Decreased step length - left;Decreased stance time - left;Decreased dorsiflexion - left;Knees buckling  General Gait Details Pt ambulated with RW, required blocking of knees L>R to prevent buckling, pt with HEAVY BUE use of RW. Further ambulation deferred secondary to unsafe pattern.  Gait velocity decreased  Balance  Overall balance assessment Needs assistance  Sitting-balance support Feet supported;No upper extremity supported  Sitting balance-Leahy Scale Good  Standing balance support Reliant on assistive device for balance;During functional activity;Bilateral upper extremity supported  Standing balance-Leahy Scale Poor  Standing balance comment Pt unable to stand without at least min assist from PT  General Comments  General comments (skin integrity, edema, etc.) Wife [name] present  Exercises  Exercises Total Joint  Total Joint Exercises  Ankle  Circles/Pumps AROM;Both;10 reps;Supine  PT - End of Session  Equipment Utilized During Treatment Gait belt  Activity Tolerance Other (comment);No increased pain (Limited Secondary to weakness and bucklign)  Patient left in bed;with call bell/phone within reach;with family/visitor present  Nurse Communication Mobility status   PT - Assessment/Plan  PT Plan Current plan remains appropriate  PT Visit Diagnosis Pain;Difficulty in walking, not elsewhere classified (R26.2)  Pain - Right/Left Left  Pain - part of body Knee  PT Frequency (ACUTE ONLY) 7X/week  Follow Up Recommendations Follow physician's recommendations for discharge plan and follow up therapies  Assistance recommended at discharge Frequent or constant Supervision/Assistance  Patient can return home with the following A little help with walking and/or transfers;A little help with bathing/dressing/bathroom;Assistance with cooking/housework;Assist for transportation;Help with stairs or ramp for entrance  PT equipment None recommended by PT  AM-PAC PT "6 Clicks" Mobility Outcome Measure (Version 2)  Help needed turning from your back to your side while in a flat bed without using bedrails? 4  Help needed moving from lying on your back to sitting on the side of a flat bed without using bedrails? 3  Help needed moving to and from a bed to a chair (including a wheelchair)? 2  Help needed standing up from a chair using your arms (e.g., wheelchair or bedside chair)? 2  Help needed to walk in hospital room? 2  Help needed climbing 3-5 steps with a railing?  2  6 Click Score 15  Consider Recommendation of Discharge To: CIR/SNF/LTACH  Progressive Mobility  What is the highest level of mobility based on the progressive mobility assessment? Level 3 (Stands with assist) - Balance while standing  and cannot march in place  Activity Stood at bedside;Transferred from bed to chair  PT Goal Progression  Progress towards PT goals Progressing toward  goals  Acute Rehab PT Goals  PT Goal Formulation With patient  Time For Goal Achievement 06/13/22  Potential to Achieve Goals Good  PT Time Calculation  PT Start Time (ACUTE ONLY) 1530  PT Stop Time (ACUTE ONLY) 1550  PT Time Calculation (min) (ACUTE ONLY) 20 min  PT General Charges  $$ ACUTE PT VISIT 1 Visit  PT Treatments  $Therapeutic Activity 8-22 mins   Coolidge Breeze, PT, DPT [facility] Office: [phone] Weekend pager: [phone]

## 2022-06-06 NOTE — Progress Notes (Signed)
Orthopedic Tech Progress Note Patient Details:  Tony Cantu 03/08/1951 [id number]  Ortho Devices Type of Ortho Device: Bone foam zero knee Ortho Device/Splint Location: left Ortho Device/Splint Interventions: Application   Post Interventions Patient Tolerated: Well Instructions Provided: Care of device, Adjustment of device  [name] 06/06/2022, 9:41 AM

## 2022-06-06 NOTE — Op Note (Signed)
PATIENT ID:      Tony Cantu  MRN:     [id number] DOB/AGE:    [birth year] / 71 y.o.       OPERATIVE REPORT   DATE OF PROCEDURE:  06/06/2022      PREOPERATIVE DIAGNOSIS:   LEFT KNEE OSTEOARTHRITIS      Estimated body mass index is 34.27 kg/m as calculated from the following:   Height as of this encounter: 5' 8"  (1.727 m).   Weight as of this encounter: 102.2 kg.                                                       POSTOPERATIVE DIAGNOSIS:   Same                                                                  PROCEDURE:  Procedure(s): LEFT TOTAL KNEE ARTHROPLASTY Using DepuyAttune RP implants #7L Femur, #8Tibia, 6 mm Attune RP bearing, 41 Patella    SURGEON: Kerin Salen  ASSISTANT:   Kerry Hough. [employer]   (Present and scrubbed throughout the case, critical for assistance with exposure, retraction, instrumentation, and closure.)        ANESTHESIA: GLMA, 20cc Exparel, 50cc 0.25% Marcaine EBL: 300 cc FLUID REPLACEMENT: 1500 cc crystaloid TOURNIQUET: DRAINS: None TRANEXAMIC ACID: 1gm IV, 2gm topical COMPLICATIONS:  None         INDICATIONS FOR PROCEDURE: The patient has  LEFT KNEE OSTEOARTHRITIS, Var deformities, XR shows bone on bone arthritis, lateral subluxation of tibia. Patient has failed all conservative measures including anti-inflammatory medicines, narcotics, attempts at exercise and weight loss, cortisone injections and viscosupplementation.  Risks and benefits of surgery have been discussed, questions answered.   DESCRIPTION OF PROCEDURE: The patient identified by armband, received  IV antibiotics, in the holding area at [facility]. Patient taken to the operating room, appropriate anesthetic monitors were attached, and GLMA anesthesia was  induced. IV Tranexamic acid was given.Tourniquet applied high to the operative thigh. Lateral post and foot positioner applied to the table, the lower extremity was then prepped and draped in usual sterile fashion from the toes to  the tourniquet. Time-out procedure was performed. Kerry Hough. [facility] PAC, was present and scrubbed throughout the case, critical for assistance with, positioning, exposure, retraction, instrumentation, and closure.The skin and subcutaneous tissue along the incision was injected with 20 cc of a mixture of Exparel and Marcaine solution, using a 20-gauge by 1-1/2 inch needle. We began the operation, with the knee flexed 130 degrees, by making the anterior midline incision starting at handbreadth above the patella going over the patella 1 cm medial to and 4 cm distal to the tibial tubercle. Small bleeders in the skin and the subcutaneous tissue identified and cauterized. Transverse retinaculum was incised and reflected medially and a medial parapatellar arthrotomy was accomplished. the patella was everted and theprepatellar fat pad resected. The superficial medial collateral ligament was then elevated from anterior to posterior along the proximal flare of the tibia and anterior half of the menisci resected. The knee was hyperflexed exposing bone on bone arthritis. Peripheral  and notch osteophytes as well as the cruciate ligaments were then resected. We continued to work our way around posteriorly along the proximal tibia, and externally rotated the tibia subluxing it out from underneath the femur. A McHale PCL retractor was placed through the notch and a lateral Hohmann retractor placed, and we then entered the proximal tibia in line with the Depuy starter drill in line with the axis of the tibia followed by an intramedullary guide rod and 0-degree posterior slope cutting guide. The tibial cutting guide, 4 degree posterior sloped, was pinned into place allowing resection of 4 mm of bone medially and 8 mm of bone laterally. Satisfied with the tibial resection, we then entered the distal femur 2 mm anterior to the PCL origin with the intramedullary guide rod and applied the distal femoral cutting guide set at 9 mm, with 5  degrees of valgus. This was pinned along the epicondylar axis. At this point, the distal femoral cut was accomplished without difficulty. We then sized for a #7L femoral component and pinned the guide in 3 degrees of external rotation. The chamfer cutting guide was pinned into place. The anterior, posterior, and chamfer cuts were accomplished without difficulty followed by the Attune RP box cutting guide and the box cut. We also removed posterior osteophytes from the posterior femoral condyles. The posterior capsule was injected with Exparel solution. The knee was brought into full extension. We checked our extension gap and fit a 6 mm bearing. Distracting in extension with a lamina spreader,  bleeders in the posterior capsule, Posterior medial and posterior lateral gutter were cauterized.  The transexamic acid-soaked sponge was then placed in the gap of the knee in extension. The knee was flexed 30. The posterior patella cut was accomplished with the 9.5 mm Attune cutting guide, sized for a 54m dome, and the fixation pegs drilled.The knee was then once again hyperflexed exposing the proximal tibia. We sized for a # 8 tibial base plate, applied the smokestack and the conical reamer followed by the the Delta fin keel punch. We then hammered into place the Attune RP trial femoral component, drilled the lugs, inserted a  6 mm trial bearing, trial patellar button, and took the knee through range of motion from 0-130 degrees. Medial and lateral ligamentous stability was checked. No thumb pressure was required for patellar Tracking. The tourniquet was not used. All trial components were removed, mating surfaces irrigated with pulse lavage, and dried with suction and sponges. 10 cc of the Exparel solution was applied to the cancellus bone of the patella distal femur and proximal tibia.  After waiting 30 seconds, the bony surfaces were again, dried with sponges. A double batch of DePuy HV cement was mixed and applied to  all bony metallic mating surfaces except for the posterior condyles of the femur itself. In order, we hammered into place the tibial tray and removed excess cement, the femoral component and removed excess cement. The final Attune RP bearing was inserted, and the knee brought to full extension with compression. The patellar button was clamped into place, and excess cement removed. The knee was held at 30 flexion with compression, while the cement cured. The wound was irrigated out with normal saline solution pulse lavage. The rest of the Exparel was injected into the parapatellar arthrotomy, subcutaneous tissues, and periosteal tissues. The parapatellar arthrotomy was closed with running #1 Vicryl suture. The subcutaneous tissue with 3-0 undyed Vicryl suture, and the skin with running 3-0 SQ vicryl. An Aquacil and  Ace wrap were applied. The patient was taken to recovery room without difficulty.   Kerin Salen 06/06/2022, 6:50 AM

## 2022-06-07 ENCOUNTER — Encounter (HOSPITAL_COMMUNITY): Payer: Self-pay | Admitting: Orthopedic Surgery

## 2022-06-07 DIAGNOSIS — Z7901 Long term (current) use of anticoagulants: Secondary | ICD-10-CM | POA: Diagnosis not present

## 2022-06-07 DIAGNOSIS — Z96651 Presence of right artificial knee joint: Secondary | ICD-10-CM | POA: Diagnosis not present

## 2022-06-07 DIAGNOSIS — E119 Type 2 diabetes mellitus without complications: Secondary | ICD-10-CM | POA: Diagnosis not present

## 2022-06-07 DIAGNOSIS — I10 Essential (primary) hypertension: Secondary | ICD-10-CM | POA: Diagnosis not present

## 2022-06-07 DIAGNOSIS — Z794 Long term (current) use of insulin: Secondary | ICD-10-CM | POA: Diagnosis not present

## 2022-06-07 DIAGNOSIS — J45909 Unspecified asthma, uncomplicated: Secondary | ICD-10-CM | POA: Diagnosis not present

## 2022-06-07 DIAGNOSIS — Z96611 Presence of right artificial shoulder joint: Secondary | ICD-10-CM | POA: Diagnosis not present

## 2022-06-07 DIAGNOSIS — I480 Paroxysmal atrial fibrillation: Secondary | ICD-10-CM | POA: Diagnosis not present

## 2022-06-07 DIAGNOSIS — Z87891 Personal history of nicotine dependence: Secondary | ICD-10-CM | POA: Diagnosis not present

## 2022-06-07 DIAGNOSIS — J449 Chronic obstructive pulmonary disease, unspecified: Secondary | ICD-10-CM | POA: Diagnosis not present

## 2022-06-07 DIAGNOSIS — Z79899 Other long term (current) drug therapy: Secondary | ICD-10-CM | POA: Diagnosis not present

## 2022-06-07 DIAGNOSIS — M1712 Unilateral primary osteoarthritis, left knee: Secondary | ICD-10-CM | POA: Diagnosis not present

## 2022-06-07 LAB — GLUCOSE, CAPILLARY
Glucose-Capillary: 175 mg/dL — ABNORMAL HIGH (ref 70–99)
Glucose-Capillary: 179 mg/dL — ABNORMAL HIGH (ref 70–99)

## 2022-06-07 NOTE — Progress Notes (Signed)
Physical Therapy Treatment Patient Details Name: Tony Cantu MRN: [id number] DOB: 11-May-1951 Today's Date: 06/07/2022   History of Present Illness Pt is a 71yo male presenting s/p L-TKA on 06/06/22. 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 Required supervision for bed mobility, min guard for transfers, and min guard for ambulation in hallway with RW 267f. Pt exhibited strong quadriceps control with only one incident of mild knee buckling that pt was able to self-correct. Pt completed stair training with min assist and use of RW, pt and wife verbalized safe gaurding position. Provided HEP and pt completed exercises with safe form and minimal cuing; emphasized the importance of walking with RW at home. All education completed and pt has no further questions. Pt has met mobility goals for safe discharge home, PT is signing off, should needs change please reconsult. Thank you for this referral.   Recommendations for follow up therapy are one component of a multi-disciplinary discharge planning process, led by the attending physician.  Recommendations may be updated based on patient status, additional functional criteria and insurance authorization.  Follow Up Recommendations  Follow physician's recommendations for discharge plan and follow up therapies     Assistance Recommended at Discharge Frequent or constant Supervision/Assistance  Patient can return home with the following A little help with walking and/or transfers;A little help with bathing/dressing/bathroom;Assistance with cooking/housework;Assist for transportation;Help with stairs or ramp for entrance   Equipment Recommendations  None recommended by PT    Recommendations for Other  Services       Precautions / Restrictions Precautions Precautions: Knee Precaution Booklet Issued: No Precaution Comments: no pillow under the knee Restrictions Weight Bearing Restrictions: Yes LLE Weight Bearing: Weight bearing as tolerated Other Position/Activity Restrictions: wbat     Mobility  Bed Mobility Overal bed mobility: Needs Assistance Bed Mobility: Supine to Sit, Sit to Supine     Supine to sit: Supervision     General bed mobility comments: Supervision for supine to sit for safety only, no physical assist required.    Transfers Overall transfer level: Needs assistance Equipment used: Rolling walker (2 wheels) Transfers: Sit to/from Stand, Bed to chair/wheelchair/BSC Sit to Stand: From elevated surface, Min guard           General transfer comment: Pt required min guard for safety only, no physical assist required    Ambulation/Gait Ambulation/Gait assistance: Min guard Gait Distance (Feet): 250 Feet Assistive device: Rolling walker (2 wheels) Gait Pattern/deviations: Step-to pattern, Step-through pattern Gait velocity: decreased     General Gait Details: Pt ambulated with RW with min guard, no physical assist required or overt LOB noted. Pt progressed from step-to to step-through pattern   Stairs Stairs: Yes Stairs assistance: Min assist Stair Management: No rails, Step to pattern, Forwards, With walker Number of Stairs: 2 General stair comments: Pt educated on stair/curb mobility with RW and min assist, pt and wife verbalized understanding. Pt demonstrated safe technique with min assist for steadying of RW, VCs for sequencing. No overt LOB noted   Wheelchair Mobility    Modified Rankin (Stroke Patients Only)       Balance Overall balance assessment: Needs assistance Sitting-balance support: Feet supported, No upper extremity supported Sitting balance-Leahy Scale: Good     Standing balance support:  Reliant on assistive device for  balance, During functional activity, Bilateral upper extremity supported Standing balance-Leahy Scale: Poor                              Cognition Arousal/Alertness: Awake/alert Behavior During Therapy: WFL for tasks assessed/performed Overall Cognitive Status: Within Functional Limits for tasks assessed                                          Exercises Total Joint Exercises Ankle Circles/Pumps: AROM, Both, 10 reps, Supine Quad Sets: AROM, Left, 10 reps Short Arc Quad: AROM, Left, 10 reps Heel Slides: AROM, Left, 10 reps Hip ABduction/ADduction: AROM, Left, 10 reps Straight Leg Raises: PROM, AROM, Left, 10 reps Goniometric ROM: -5-95 by gross visual approximation, appeared to be limited by ACE wrap    General Comments General comments (skin integrity, edema, etc.): Wife [name] Present      Pertinent Vitals/Pain Pain Assessment Pain Assessment: Faces Faces Pain Scale: Hurts little more Pain Location: left knee Pain Descriptors / Indicators: Operative site guarding, Aching Pain Intervention(s): Limited activity within patient's tolerance, Monitored during session, Repositioned    Home Living                          Prior Function            PT Goals (current goals can now be found in the care plan section) Acute Rehab PT Goals Patient Stated Goal: To walk without pain PT Goal Formulation: With patient Time For Goal Achievement: 06/13/22 Potential to Achieve Goals: Good Progress towards PT goals: Goals met/education completed, patient discharged from PT    Frequency    7X/week      PT Plan Current plan remains appropriate    Co-evaluation              AM-PAC PT "6 Clicks" Mobility   Outcome Measure  Help needed turning from your back to your side while in a flat bed without using bedrails?: None Help needed moving from lying on your back to sitting on the side of a flat bed without using bedrails?: A  Little Help needed moving to and from a bed to a chair (including a wheelchair)?: A Little Help needed standing up from a chair using your arms (e.g., wheelchair or bedside chair)?: A Little Help needed to walk in hospital room?: A Little Help needed climbing 3-5 steps with a railing? : A Little 6 Click Score: 19    End of Session Equipment Utilized During Treatment: Gait belt Activity Tolerance: No increased pain;Patient tolerated treatment well Patient left: with call bell/phone within reach;with family/visitor present;in chair Nurse Communication: Mobility status PT Visit Diagnosis: Pain;Difficulty in walking, not elsewhere classified (R26.2) Pain - Right/Left: Left Pain - part of body: Knee     Time: 9211-9417 PT Time Calculation (min) (ACUTE ONLY): 29 min  Charges:  $Gait Training: 8-22 mins $Therapeutic Exercise: 8-22 mins                     Coolidge Breeze, PT, DPT [facility] Department Office: [phone] Weekend pager: [phone]   Tony Cantu 06/07/2022, 10:46 AM

## 2022-06-07 NOTE — TOC Transition Note (Addendum)
Transition of Care [facility]) - CM/SW Discharge Note   Patient Details  Name: Tony Cantu MRN: [id number] Date of Birth: [birth year]  Transition of Care [facility]) CM/SW Contact:  Servando Snare, LCSW Phone Number: 06/07/2022, 9:04 AM   Clinical Narrative:   Patient from home with spouse. Independent at baseline. Ortho recommend DME and HHPT for dc. DME to be delivered bedside prior to patient leaving the hospital. Home health Pre arranged with [facility]  PLAN: Patient to dc home with DME and HHPT    Final next level of care: Home w Home Health Services Barriers to Discharge: No Barriers Identified   Patient Goals and CMS Choice Patient states their goals for this hospitalization and ongoing recovery are:: Go home CMS Medicare.gov Compare Post Acute Care list provided to:: Patient Choice offered to / list presented to : Patient  Discharge Placement                Patient to be transferred to facility by: Family   Patient and family notified of of transfer: 06/07/22  Discharge Plan and Services                DME Arranged: 3-N-1[facility] Rile DME Agency: [employer] Date DME Agency Contacted: 06/07/22 Time DME Agency Contacted: [phone] Representative spoke with at DME Agency: Darcel Bayley Arranged: PT          Social Determinants of Health ([place]) Interventions     Readmission Risk Interventions     No data to display

## 2022-06-07 NOTE — Progress Notes (Signed)
PATIENT ID: Tony Cantu  MRN: [id number]  DOB/AGE:  [birth year] / 71 y.o.  1 Day Post-Op Procedure(s) (LRB): LEFT TOTAL KNEE ARTHROPLASTY (Left)    PROGRESS NOTE Subjective: Patient is alert, oriented, no Nausea, no Vomiting, yes passing gas. Taking PO well. Denies SOB, Chest or Calf Pain. Using Incentive Spirometer, PAS in place. Ambulate 3', Patient reports pain as 3/10 .    Objective: Vital signs in last 24 hours: Vitals:   06/06/22 1922 06/06/22 2025 06/06/22 2124 06/07/22 0200  BP: 122/69 107/60 120/65 (!) 92/51  Pulse: 79 68 66 62  Resp: 18 18 18 18   Temp: 97.6 F (36.4 C) 98.2 F (36.8 C) 97.8 F (36.6 C) 98.1 F (36.7 C)  TempSrc: Oral Oral Oral Oral  SpO2: 96% 95% 96% 95%  Weight:      Height:          Intake/Output from previous day: I/O last 3 completed shifts: In: 3604.1 [P.O.:1290; I.V.:2014.1; IV Piggyback:300] Out: 2925 [Urine:2725; Blood:200]   Intake/Output this shift: No intake/output data recorded.   LABORATORY DATA: Recent Labs    06/06/22 0945 06/06/22 1411 06/06/22 1711  GLUCAP 152* 229* 219*    Examination: Neurologically intact ABD soft Neurovascular intact Sensation intact distally Intact pulses distally Dorsiflexion/Plantar flexion intact Incision: scant drainage No cellulitis present Compartment soft}  Assessment:   1 Day Post-Op Procedure(s) (LRB): LEFT TOTAL KNEE ARTHROPLASTY (Left) ADDITIONAL DIAGNOSIS: Expected Acute Blood Loss Anemia, type 2 diabetes, hypertension, history of arrhythmias.  Patient's anticipated LOS is less than 2 midnights, meeting these requirements: - Younger than 64 - Lives within 1 hour of care - Has a competent adult at home to recover with post-op recover - NO history of  - Chronic pain requiring opiods  - Diabetes  - Coronary Artery Disease  - Heart failure  - Heart attack  - Stroke  - DVT/VTE  - Cardiac arrhythmia  - Respiratory Failure/COPD  - Renal failure  - Anemia  - Advanced Liver  disease     Plan: PT/OT WBAT, AROM and PROM  DVT Prophylaxis:  SCDx72hrs, ASA 81 mg BID x 2 weeks DISCHARGE PLAN: Home, today if the patient is able to pass physical therapy.  I do believe he will DISCHARGE NEEDS: HHPT, Walker, and 3-in-1 comode seat     Kerin Salen 06/07/2022, 7:03 AM Patient ID: Tony Cantu, male   DOB: October 04, 1950, 71 y.o.   MRN: [id number]

## 2022-06-07 NOTE — Plan of Care (Signed)
Patient is stable for discharge. Patient has been given discharge instructions. Patient understood instructions and all questions answered. Patient is discharged to home with wife. Tony Cantu

## 2022-06-08 ENCOUNTER — Encounter: Payer: Self-pay | Admitting: *Deleted

## 2022-06-08 ENCOUNTER — Telehealth: Payer: Self-pay | Admitting: *Deleted

## 2022-06-08 NOTE — Patient Outreach (Signed)
  Care Coordination TOC Note Transition Care Management Follow-up Telephone Call Date of discharge and from where: [facility] on 06/07/22 How have you been since you were released from the hospital? Per wife, "He's in quite a bit of pain today since the nerve block wore off" Any questions or concerns? Yes. Would like refill of Zanaflex sent to [facility]. Per wife, pharmacy has sent a request to prescriber at [facility].   Items Reviewed: Did the pt receive and understand the discharge instructions provided? Yes  Medications obtained and verified? Yes  Other? Yes Patient is wearing his support hose and doing his therapy exercises. He is managing pain with Aanaflex primarily and has taken oxycodone only once. It really sedates him and the Zanaflex controls the pain well enough without making him drowsy.  Any new allergies since your discharge? No  Dietary orders reviewed? Yes Do you have support at home? Yes   Home Care and Equipment/Supplies: Were home health services ordered? no If so, what is the name of the agency? N/a  Has the agency set up a time to come to the patient's home? not applicable Were any new equipment or medical supplies ordered?  No What is the name of the medical supply agency? N/a Were you able to get the supplies/equipment? not applicable Do you have any questions related to the use of the equipment or supplies? No  Functional Questionnaire: (I = Independent and D = Dependent) ADLs: I  Bathing/Dressing- I  Meal Prep- I  Eating- I  Maintaining continence- I  Transferring/Ambulation- I  Managing Meds- I  Follow up appointments reviewed:  PCP Hospital f/u appt confirmed?  Need not indicated Specialist Hospital f/u appt confirmed? Yes  Scheduled to see Dr Mayer Camel (ortho) on 06/16/22 at 2:45 Are transportation arrangements needed? No  If their condition worsens, is the pt aware to call PCP or go to the Emergency Dept.? Yes Was the patient  provided with contact information for the PCP's office or ED? Yes Was to pt encouraged to call back with questions or concerns? Yes  SDOH assessments and interventions completed:   Yes  Care Coordination Interventions Activated:  Yes   Care Coordination Interventions:  [place] [phone] to follow-up on refill request for Zanaflex. They did not have a request on file but entered one for me and sent it back to the provider for review. I explained that he is using the Zanaflex for primary pain control instead of the oxycodone. Asked that they notify wife, [name], of refill authorization status once reviewed.    Encounter Outcome:  Pt. Visit Completed    Chong Sicilian, BSN, RN-BC RN Care Coordinator [place] Direct Dial: [phone] Main #: [phone]

## 2022-06-09 ENCOUNTER — Encounter: Payer: Self-pay | Admitting: *Deleted

## 2022-06-09 ENCOUNTER — Telehealth: Payer: Self-pay | Admitting: *Deleted

## 2022-06-09 NOTE — Patient Outreach (Signed)
  Care Coordination   Initial Visit Note   06/09/2022 Name: Tony Cantu MRN: [id number] DOB: [birth year]  Tony Cantu is a 71 y.o. year old male who sees [name], DO for primary care. I  spoke with patient's wife, Tony Cantu, by telephone today.  What matters to the patients health and wellness today? Adequate pain management after knee surgery    Goals Addressed             This Visit's Progress    COMPLETED: Care Coordination Services (No Follow-up Required)       Care Coordination Interventions: Evaluation of current treatment plan related to recent knee surgery and patient's adherence to plan as established by provider Assessed social determinant of health barriers Assessed family/social support Wife is available to assist Discussed pain management Prescribed oxycodone at hospital discharge. Patient also has zanaflex and has been using that as his primary pain control method because it is not sedating and the oxycodone makes him too drowsy. Zanaflex is providing adequate relief. Patient had requested a refill of zanaflex through [facility] [facility] yesterday but had not received it by the time I made my TOC call I reached out to [facility] yesterday to explain why patient is requesting zanaflex and requested that they send in a refill to [facility] and to notify his wife, Tony Cantu Talked with Tony Cantu this morning and she has not received a call from the pharmacy or [facility]. Tony Cantu is going to call the pharmacy to check on the status and then will f/u with [facility] if they have not received the refill authorization Encouraged to reach out to PCP or ortho as needed Assessed need for ongoing Care Coordination services. They are not needed at this time but will ask PCP for referral if services are needed in the future.          SDOH assessments and interventions completed:  Yes     Care Coordination Interventions Activated:  Yes  Care Coordination  Interventions:  Yes, provided   Follow up plan: No further intervention required.   Encounter Outcome:  Pt. Visit Completed   Chong Sicilian, BSN, RN-BC RN Care Coordinator [place] Direct Dial: [phone] Main #: [phone]

## 2022-06-10 ENCOUNTER — Other Ambulatory Visit: Payer: Self-pay

## 2022-06-10 ENCOUNTER — Ambulatory Visit: Payer: BC Managed Care – PPO | Attending: Orthopedic Surgery

## 2022-06-10 DIAGNOSIS — M25662 Stiffness of left knee, not elsewhere classified: Secondary | ICD-10-CM | POA: Diagnosis not present

## 2022-06-10 DIAGNOSIS — M25562 Pain in left knee: Secondary | ICD-10-CM | POA: Diagnosis not present

## 2022-06-10 NOTE — Therapy (Signed)
OUTPATIENT PHYSICAL THERAPY LOWER EXTREMITY EVALUATION   Patient Name: Tony Cantu MRN: [id number] DOB:1951/01/06, 71 y.o., male Today's Date: 06/10/2022   PT End of Session - 06/10/22 0818     Visit Number 1    Number of Visits 15    Date for PT Re-Evaluation 07/22/22    PT Start Time 0818    PT Stop Time 0858    PT Time Calculation (min) 40 min    Activity Tolerance Patient tolerated treatment well    Behavior During Therapy [facility] for tasks assessed/performed             Past Medical History:  Diagnosis Date   Aortic atherosclerosis ([place])    Aortic stenosis    Arthritis    Asthma    BPH (benign prostatic hypertrophy)    Cirrhosis ([place]) 2016   Colon polyps    Diabetes mellitus without complication ([place])    TYPE 1    Diverticulosis    Dysrhythmia 04/19/2021   A-fib noted with possible A-fib RVR   Gastric ulcer    Gastric varices    right   Gastritis    GERD (gastroesophageal reflux disease)    Headache    History- pt states these were migraines that occurred in the 1980's   Heart murmur    History of kidney stones    Hx of adenomatous colonic polyps    Hypertension    PONV (postoperative nausea and vomiting)    pt states he has never had post op nausea or vomiting   Portal hypertensive gastropathy (HCC)    Renal cyst, right    Seizures (HCC)    HYPOGLYCEMIC LAST 1 AND 1/2 YRS AGO   Sleep apnea    uses CPAP nightly   Testicle trouble    one testicle BORN WITH   Tubular adenoma of colon    Past Surgical History:  Procedure Laterality Date   BACK SURGERY     94  LOWER    CARPAL TUNNEL RELEASE Right 10/13/2015   Procedure: RIGHT CARPAL TUNNEL RELEASE;  Surgeon: Daryll Brod, MD;  Location: [place];  Service: Orthopedics;  Laterality: Right;   CARPAL TUNNEL RELEASE Left 07/05/2016   Procedure: LEFT CARPAL TUNNEL RELEASE;  Surgeon: Daryll Brod, MD;  Location: [place];  Service: Orthopedics;  Laterality: Left;   DRUG  INDUCED ENDOSCOPY N/A 07/02/2021   Procedure: DRUG INDUCED SLEEP ENDOSCOPY;  Surgeon: Melida Quitter, MD;  Location: [place];  Service: ENT;  Laterality: N/A;   IMPLANTATION OF HYPOGLOSSAL NERVE STIMULATOR Right 10/01/2021   Procedure: IMPLANTATION OF HYPOGLOSSAL NERVE STIMULATOR;  Surgeon: Melida Quitter, MD;  Location: [place];  Service: ENT;  Laterality: Right;   INGUINAL HERNIA REPAIR  2003   right    KNEE ARTHROSCOPY Right 03/03/2016   LUMBAR DISC SURGERY  03/1995   Dr. Coralyn Mark, discectomy   LUMBAR LAMINECTOMY/DECOMPRESSION MICRODISCECTOMY Right 10/06/2016   Procedure: RIGHT LUMBAR THREE - LUMBAR FOUR  LAMINECTOMY, FORAMINOTOMY AND MICRODISCECTOMY;  Surgeon: Jovita Gamma, MD;  Location: [place];  Service: Neurosurgery;  Laterality: Right;   SHOULDER SURGERY  11/28/2005   left partial   SHOULDER SURGERY  07/14/2006   RIGHT   TONSILLECTOMY  AGE 60 OR 4   TOTAL KNEE ARTHROPLASTY Right 03/07/2022   Procedure: RIGHT TOTAL KNEE ARTHROPLASTY;  Surgeon: Frederik Pear, MD;  Location: [facility];  Service: Orthopedics;  Laterality: Right;   TOTAL KNEE ARTHROPLASTY Left 06/06/2022   Procedure: LEFT TOTAL KNEE ARTHROPLASTY;  Surgeon: Frederik Pear, MD;  Location: [facility];  Service: Orthopedics;  Laterality: Left;   TOTAL SHOULDER ARTHROPLASTY Right 11/01/2018   Procedure: RIGHT SHOULDER REVISION TO REVERSE TOTAL SHOULDER;  Surgeon: Tania Ade, MD;  Location: [facility];  Service: Orthopedics;  Laterality: Right;  CHOICE ANESTHESIA WITH INTERSCALENE BLOCK EXPAREL, NEEDS RNFA   Patient Active Problem List   Diagnosis Date Noted   S/P total knee arthroplasty, left 06/06/2022   Degenerative arthritis of left knee 06/03/2022   Chronic obstructive pulmonary disease ([place]) 05/10/2022   Grade I diastolic dysfunction 65/78/4696   Postoperative fever 03/10/2022   S/P TKR (total knee replacement), right 03/07/2022   Osteoarthritis of right knee 03/04/2022   Precordial chest pain 04/20/2021   Former smoker  01/12/2021   PAF (paroxysmal atrial fibrillation) ([place]) 06/22/2020   Hypertension associated with type 2 diabetes mellitus ([place]) 05/17/2019   Hyperlipidemia associated with type 2 diabetes mellitus ([place]) 05/17/2019   GERD with esophagitis 05/17/2019   H/O total shoulder replacement, right 11/01/2018   Paroxysmal tachycardia ([place]) 01/11/2017   Nonrheumatic aortic valve stenosis 01/11/2017   Aortic atherosclerosis ([place]) 11/23/2016   HNP (herniated nucleus pulposus), lumbar 10/06/2016   Acute renal injury ([place]) 08/06/2016   Diarrhea 08/06/2016   Fever 08/06/2016   Hyperbilirubinemia 08/06/2016   Lactic acidosis 08/06/2016   Inguinal hernia 12/10/2015   Right groin pain 11/30/2015   Carpal tunnel syndrome on right 09/09/2015   Cervical spondylosis without myelopathy 09/09/2015   Thrombocytopenia ([place]) 07/21/2014   Bilateral carotid bruits 05/11/2014   Vitamin D deficiency 09/17/2013   BPH (benign prostatic hyperplasia) 05/22/2013   Low serum testosterone level 02/18/2013   Diabetes type 2, controlled ([place]) 01/10/2013   OSA (obstructive sleep apnea) 05/16/2012   Edema 02/21/2012   At risk for coronary artery disease 03/20/2011   Obesity 03/20/2011   Allergic rhinitis 06/22/2010   ASTHMA 06/22/2010   COUGH 06/22/2010    PCP: [name], DO  REFERRING PROVIDER: Frederik Pear, MD  REFERRING DIAG: s/p Left TKA   THERAPY DIAG:  Acute pain of left knee  Stiffness of left knee, not elsewhere classified  Rationale for Evaluation and Treatment Rehabilitation  ONSET DATE: 06/06/22  SUBJECTIVE:   SUBJECTIVE STATEMENT: Patient reports that he had a left knee replacement on 06/06/22. He notes that his knee has been hurting quite a bit since his nerve block wore off around 06/08/22.   PERTINENT HISTORY: HTN, DM, and OA  PAIN:  Are you having pain? Yes: NPRS scale: 8/10 Pain location: left knee Pain description: aching Aggravating factors: moving his knee and  walking Relieving factors: medication and ice  PRECAUTIONS: None  WEIGHT BEARING RESTRICTIONS No  FALLS:  Has patient fallen in last 6 months? No  LIVING ENVIRONMENT: Lives with: lives with their spouse Lives in: House/apartment Stairs: Yes: Internal: 20-25 steps; can reach both and External: 2 steps; can reach both Has following equipment at home: [place] - 2 wheeled  OCCUPATION: retired  PLOF: Independent  PATIENT GOALS: walking without assistive device, go outside an take pictures   OBJECTIVE:   PATIENT SURVEYS:  FOTO 16.73  COGNITION:  Overall cognitive status: Within functional limits for tasks assessed     SENSATION: Patient reports no numbness and tingling  EDEMA:  Moderate LLE edema (wearing compression stockings); no signs or symptoms of DVT  PALPATION: TTP: left patella and patellar tendon  LOWER EXTREMITY ROM:  Active ROM Right eval Left eval  Hip flexion    Hip extension  Hip abduction    Hip adduction    Hip internal rotation    Hip external rotation    Knee flexion 136 86/90 (PROM)  Knee extension 0 16  Ankle dorsiflexion    Ankle plantarflexion    Ankle inversion    Ankle eversion     (Blank rows = not tested)  LOWER EXTREMITY MMT: not tested due to surgical condition  FUNCTIONAL TESTS:  Required therapist assistance (modA) for sit to supine and sit to stand transfer   GAIT: Assistive device utilized: Environmental consultant - 2 wheeled Level of assistance: Modified independence Comments: step to pattern, poor foot clearance bilaterally, left knee flexed in stance, minimal left knee flexion in swing phase    TODAY'S TREATMENT: Modalities  Date:  [place]: Knee, 34 degrees; low pressure, 15 mins, Pain and Edema   PATIENT EDUCATION:  Education details: POC, healing, prognosis, HEP Person educated: Patient Education method: Explanation Education comprehension: verbalized understanding   HOME EXERCISE PROGRAM: HEP provided by his surgeon  was reviewed  ASSESSMENT:  CLINICAL IMPRESSION: Patient is a 71 y.o. male who was seen today for physical therapy evaluation and treatment following a left total knee arthroplasty on 06/06/22. He presented with high pain severity and irritability with transfers, left knee ROM, and ambulation aggravating his familiar symptoms. His surgeons HEP was reviewed and he required minimal cueing with these exercises. Modalities at the conclusion of his evaluation was able to significantly reduce his familiar symptoms. Recommend that he continue with skilled physical therapy to address his remaining impairments to return to his prior level of function.   OBJECTIVE IMPAIRMENTS Abnormal gait, decreased activity tolerance, decreased endurance, decreased mobility, difficulty walking, decreased ROM, decreased strength, hypomobility, increased edema, impaired flexibility, and pain.   ACTIVITY LIMITATIONS carrying, lifting, standing, squatting, sleeping, stairs, transfers, bed mobility, bathing, toileting, dressing, and locomotion level  PARTICIPATION LIMITATIONS: meal prep, cleaning, laundry, driving, shopping, and community activity  PERSONAL FACTORS 3+ comorbidities: HTN, DM, and OA  are also affecting patient's functional outcome.   REHAB POTENTIAL: Good  CLINICAL DECISION MAKING: Stable/uncomplicated  EVALUATION COMPLEXITY: Low   GOALS: Goals reviewed with patient? No  SHORT TERM GOALS: Target date: 07/01/2022  Patient will be independent with his initial HEP.  Baseline: Goal status: INITIAL  2.  Patient will be able to demonstrate active left knee extension within 10 degrees of neutral for improved gait mechanics.  Baseline:  Goal status: INITIAL  3.  Patient will be able to walk with a cane or the least restrictive assistive device for at least 80 feet.  Baseline:  Goal status: INITIAL  4.  Patient will be able to complete his daily activities without his familiar knee pain exceeding 6/10.   Baseline:  Goal status: INITIAL  LONG TERM GOALS: Target date: 07/22/2022   Patient will be independent with his advanced HEP.  Baseline:  Goal status: INITIAL  2.  Patient will be able to demonstrate active left knee extension within 5 degrees of neutral for improved gait mechanics.  Baseline:  Goal status: INITIAL  3.  Patient will be able to demonstrate at least 120 degree of active left knee flexion for improved function navigating stairs.  Baseline:  Goal status: INITIAL  4.  Patient will be able to navigate at least 4 steps with a reciprocal pattern for improved function with household navigation.  Baseline:  Goal status: INITIAL  5.  Patient will be able to complete his daily activities without his familiar pain exceeding 4/10.  Baseline:  Goal status: INITIAL  PLAN: PT FREQUENCY: 2-3x/week  PT DURATION: 6 weeks  PLANNED INTERVENTIONS: Therapeutic exercises, Therapeutic activity, Neuromuscular re-education, Balance training, Gait training, Patient/Family education, Self Care, Joint mobilization, Stair training, Electrical stimulation, Cryotherapy, Moist heat, Vasopneumatic device, Manual therapy, and Re-evaluation  PLAN FOR NEXT SESSION: nustep, quad sets, heel slides, gastroc stretch, manual therapy, and modalities as needed   Darlin Coco, PT 06/10/2022, 10:26 AM

## 2022-06-12 NOTE — Discharge Summary (Signed)
Patient ID: Tony Cantu MRN: [id number] DOB/AGE: 1950/09/16 71 y.o.  Admit date: 06/06/2022 Discharge date: 06/12/2022  Admission Diagnoses:  Principal Problem:   Degenerative arthritis of left knee Active Problems:   S/P total knee arthroplasty, left   Discharge Diagnoses:  Same  Past Medical History:  Diagnosis Date   Aortic atherosclerosis (H[facility])    Aortic stenosis    Arthritis    Asthma    BPH (benign prostatic hypertrophy)    Cirrhosis ([place]) 2016   Colon polyps    Diabetes mellitus without complication ([place])    TYPE 1    Diverticulosis    Dysrhythmia 04/19/2021   A-fib noted with possible A-fib RVR   Gastric ulcer    Gastric varices    right   Gastritis    GERD (gastroesophageal reflux disease)    Headache    History- pt states these were migraines that occurred in the 1980's   Heart murmur    History of kidney stones    Hx of adenomatous colonic polyps    Hypertension    PONV (postoperative nausea and vomiting)    pt states he has never had post op nausea or vomiting   Portal hypertensive gastropathy ([facility]CC)    Renal cyst, right    Seizures (HCC)    HYPOGLYCEMIC LAST 1 AND 1/2 YRS AGO   Sleep apnea    uses CPAP nightly   Testicle trouble    one testicle BORN WITH   Tubular adenoma of colon     Surgeries: Procedure(s): LEFT TOTAL KNEE ARTHROPLASTY on 06/06/2022   Consultants:   Discharged Condition: Improved  Hospital Course: Tony Cantu is an 71 y.o. male who was admitted 06/06/2022 for operative treatment ofDegenerative arthritis of left knee. Patient has severe unremitting pain that affects sleep, daily activities, and work/hobbies. After pre-op clearance the patient was taken to the operating room on 06/06/2022 and underwent  Procedure(s): LEFT TOTAL KNEE ARTHROPLASTY.    Patient was given perioperative antibiotics:  Anti-infectives (From admission, onward)    Start     Dose/Rate Route Frequency Ordered Stop   06/06/22 0600  ceFAZolin (ANCEF)  IVPB 2g/100 mL premix        2 g 200 mL/hr over 30 Minutes Intravenous On call to O.R. 06/06/22 5003 06/06/22 0726        Patient was given sequential compression devices, early ambulation, and chemoprophylaxis to prevent DVT.  Patient benefited maximally from hospital stay and there were no complications.    Recent vital signs: No data found.   Recent laboratory studies: No results for input(s): "WBC", "HGB", "HCT", "PLT", "NA", "K", "CL", "CO2", "BUN", "CREATININE", "GLUCOSE", "INR", "CALCIUM" in the last 72 hours.  Invalid input(s): "PT", "2"   Discharge Medications:   Allergies as of 06/07/2022       Reactions   Dimetapp Children's Cold-cough    Chest discomfort    Erythromycin Diarrhea   Sulfonamide Derivatives Diarrhea        Medication List     STOP taking these medications    oxyCODONE-acetaminophen 5-325 MG tablet Commonly known as: PERCOCET/ROXICET       TAKE these medications    acetaminophen 500 MG tablet Commonly known as: TYLENOL Take 1,000 mg by mouth every 6 (six) hours as needed for moderate pain.   apixaban 2.5 MG Tabs tablet Commonly known as: ELIQUIS Take 1 tablet (2.5 mg total) by mouth 2 (two) times daily. What changed:  medication strength how much to take  atorvastatin 40 MG tablet Commonly known as: LIPITOR TAKE 1 TABLET BY MOUTH DAILY What changed: when to take this   b complex vitamins tablet Take 1 tablet by mouth daily.   CHROMIUM PICOLATE PO Take 1 tablet by mouth daily.   CINNAMON PO Take 1,080 mg by mouth daily.   Contour Next One Kit Test BS QID and as needed Dx E11.9   Contour Next Test test strip Generic drug: glucose blood Test BS QID and as needed Dx E11.9   CoQ10 100 MG Caps Take 100 mg by mouth daily.   Dialyvite Vitamin D 5000 125 MCG (5000 UT) capsule Generic drug: Cholecalciferol Take 5,000 Units by mouth daily.   diltiazem 30 MG tablet Commonly known as: CARDIZEM TAKE 1 TABLET BY MOUTH   TWICE DAILY   Eye Health Caps Take 1 capsule by mouth daily.   FreeStyle Libre Reader Devi 1 applicator by Does not apply route as directed.   FreeStyle [employer] System Misc Check BS eight (8) times a day. Dx E10.9   furosemide 20 MG tablet Commonly known as: LASIX TAKE 1 AND 1/2 TABLETS BY MOUTH  DAILY What changed: how much to take   Glucagon Emergency 1 MG Kit INJECT AS DIRECTED INTO  UPPER ARM, THIGH OR  BUTTOCKS AS NEEDED FOR  SEVERE HYPOGLYCEMIA. SEEK  MEDICAL ATTENTION AFTER USE What changed: See the new instructions.   Glucosamine 1500 Complex Caps Take 2 capsules by mouth daily.   insulin lispro 100 UNIT/ML injection Commonly known as: HumaLOG INJECT SUBCUTANEOUSLY 20 TO 50  UNITS 3 TIMES DAILY BEFORE MEALS PER SLIDING SCALE What changed:  how much to take how to take this when to take this additional instructions   Magnesium 250 MG Tabs Take 250 mg by mouth daily.   METAMUCIL FIBER PO Take 1 capsule by mouth 2 (two) times daily.   Microlet Lancets Misc Test BS QID and as needed Dx E11.9   montelukast 10 MG tablet Commonly known as: SINGULAIR TAKE 1 TABLET BY MOUTH AT  BEDTIME   oxyCODONE 5 MG immediate release tablet Commonly known as: Roxicodone Take 1-2 tablets (5-10 mg total) by mouth every 4 (four) hours as needed for up to 7 days for severe pain.   pantoprazole 40 MG tablet Commonly known as: PROTONIX Take 1 tablet (40 mg total) by mouth 2 (two) times daily before a meal. NEEDS OFFICE VISIT FOR ADDITIONAL REFILLS   tiZANidine 2 MG tablet Commonly known as: ZANAFLEX Take 2 mg by mouth every 8 (eight) hours as needed for muscle spasms.   Toujeo Max SoloStar 300 UNIT/ML Solostar Pen Generic drug: insulin glargine (2 Unit Dial) INJECT SUBCUTANEOUSLY 110 UNITS  DAILY What changed: See the new instructions.   Turmeric Curcumin 500 MG Caps Take 500 mg by mouth daily.               Discharge Care Instructions  (From admission,  onward)           Start     Ordered   06/07/22 0000  Change dressing       Comments: Change dressing Only if drainage exceeds 40% of window on dressing   06/07/22 0706            Diagnostic Studies: SLEEP STUDY DOCUMENTS  Result Date: 05/26/2022 Ordered by an unspecified provider.   Disposition: Discharge disposition: 01-Home or Self Care       Discharge Instructions     Call MD / Call 911  Complete by: As directed    If you experience chest pain or shortness of breath, CALL 911 and be transported to the hospital emergency room.  If you develope a fever above 101 F, pus (white drainage) or increased drainage or redness at the wound, or calf pain, call your surgeon's office.   Change dressing   Complete by: As directed    Change dressing Only if drainage exceeds 40% of window on dressing   Constipation Prevention   Complete by: As directed    Drink plenty of fluids.  Prune juice may be helpful.  You may use a stool softener, such as Colace (over the counter) 100 mg twice a day.  Use MiraLax (over the counter) for constipation as needed.   Diet - low sodium heart healthy   Complete by: As directed    Increase activity slowly as tolerated   Complete by: As directed    Post-operative opioid taper instructions:   Complete by: As directed    POST-OPERATIVE OPIOID TAPER INSTRUCTIONS: It is important to wean off of your opioid medication as soon as possible. If you do not need pain medication after your surgery it is ok to stop day one. Opioids include: Codeine, Hydrocodone(Norco, Vicodin), Oxycodone(Percocet, oxycontin) and hydromorphone amongst others.  Long term and even short term use of opiods can cause: Increased pain response Dependence Constipation Depression Respiratory depression And more.  Withdrawal symptoms can include Flu like symptoms Nausea, vomiting And more Techniques to manage these symptoms Hydrate well Eat regular healthy meals Stay  active Use relaxation techniques(deep breathing, meditating, yoga) Do Not substitute Alcohol to help with tapering If you have been on opioids for less than two weeks and do not have pain than it is ok to stop all together.  Plan to wean off of opioids This plan should start within one week post op of your joint replacement. Maintain the same interval or time between taking each dose and first decrease the dose.  Cut the total daily intake of opioids by one tablet each day Next start to increase the time between doses. The last dose that should be eliminated is the evening dose.           Follow-up Information     Frederik Pear, MD Follow up in 2 week(s).   Specialty: Orthopedic Surgery Contact information: [place] [id number] [phone]                  Signed: Kerin Salen 06/12/2022, 5:52 PM

## 2022-06-13 ENCOUNTER — Ambulatory Visit: Payer: BC Managed Care – PPO

## 2022-06-13 DIAGNOSIS — M25662 Stiffness of left knee, not elsewhere classified: Secondary | ICD-10-CM

## 2022-06-13 DIAGNOSIS — M25562 Pain in left knee: Secondary | ICD-10-CM | POA: Diagnosis not present

## 2022-06-13 NOTE — Therapy (Signed)
OUTPATIENT PHYSICAL THERAPY LOWER EXTREMITY TREATMENT   Patient Name: Tony Cantu MRN: [id number] DOB:01-18-1951, 71 y.o., male Today's Date: 06/13/2022   PT End of Session - 06/13/22 1441     Visit Number 2    Number of Visits 15    Date for PT Re-Evaluation 07/22/22    PT Start Time 1438    PT Stop Time 1527    PT Time Calculation (min) 49 min    Activity Tolerance Patient tolerated treatment well    Behavior During Therapy [facility] for tasks assessed/performed              Past Medical History:  Diagnosis Date   Aortic atherosclerosis ([place])    Aortic stenosis    Arthritis    Asthma    BPH (benign prostatic hypertrophy)    Cirrhosis ([place]) 2016   Colon polyps    Diabetes mellitus without complication ([place])    TYPE 1    Diverticulosis    Dysrhythmia 04/19/2021   A-fib noted with possible A-fib RVR   Gastric ulcer    Gastric varices    right   Gastritis    GERD (gastroesophageal reflux disease)    Headache    History- pt states these were migraines that occurred in the 1980's   Heart murmur    History of kidney stones    Hx of adenomatous colonic polyps    Hypertension    PONV (postoperative nausea and vomiting)    pt states he has never had post op nausea or vomiting   Portal hypertensive gastropathy (HCC)    Renal cyst, right    Seizures (HCC)    HYPOGLYCEMIC LAST 1 AND 1/2 YRS AGO   Sleep apnea    uses CPAP nightly   Testicle trouble    one testicle BORN WITH   Tubular adenoma of colon    Past Surgical History:  Procedure Laterality Date   BACK SURGERY     94  LOWER    CARPAL TUNNEL RELEASE Right 10/13/2015   Procedure: RIGHT CARPAL TUNNEL RELEASE;  Surgeon: Daryll Brod, MD;  Location: [place];  Service: Orthopedics;  Laterality: Right;   CARPAL TUNNEL RELEASE Left 07/05/2016   Procedure: LEFT CARPAL TUNNEL RELEASE;  Surgeon: Daryll Brod, MD;  Location: [place];  Service: Orthopedics;  Laterality: Left;   DRUG  INDUCED ENDOSCOPY N/A 07/02/2021   Procedure: DRUG INDUCED SLEEP ENDOSCOPY;  Surgeon: Melida Quitter, MD;  Location: [place];  Service: ENT;  Laterality: N/A;   IMPLANTATION OF HYPOGLOSSAL NERVE STIMULATOR Right 10/01/2021   Procedure: IMPLANTATION OF HYPOGLOSSAL NERVE STIMULATOR;  Surgeon: Melida Quitter, MD;  Location: [place];  Service: ENT;  Laterality: Right;   INGUINAL HERNIA REPAIR  2003   right    KNEE ARTHROSCOPY Right 03/03/2016   LUMBAR DISC SURGERY  03/1995   Dr. Coralyn Mark, discectomy   LUMBAR LAMINECTOMY/DECOMPRESSION MICRODISCECTOMY Right 10/06/2016   Procedure: RIGHT LUMBAR THREE - LUMBAR FOUR  LAMINECTOMY, FORAMINOTOMY AND MICRODISCECTOMY;  Surgeon: Jovita Gamma, MD;  Location: [place];  Service: Neurosurgery;  Laterality: Right;   SHOULDER SURGERY  11/28/2005   left partial   SHOULDER SURGERY  07/14/2006   RIGHT   TONSILLECTOMY  AGE 20 OR 4   TOTAL KNEE ARTHROPLASTY Right 03/07/2022   Procedure: RIGHT TOTAL KNEE ARTHROPLASTY;  Surgeon: Frederik Pear, MD;  [facility];  Service: Orthopedics;  Laterality: Right;   TOTAL KNEE ARTHROPLASTY Left 06/06/2022   Procedure: LEFT TOTAL KNEE ARTHROPLASTY;  Surgeon: Frederik Pear, MD;  [facility];  Service: Orthopedics;  Laterality: Left;   TOTAL SHOULDER ARTHROPLASTY Right 11/01/2018   Procedure: RIGHT SHOULDER REVISION TO REVERSE TOTAL SHOULDER;  Surgeon: Tania Ade, MD;  [facility];  Service: Orthopedics;  Laterality: Right;  CHOICE ANESTHESIA WITH INTERSCALENE BLOCK EXPAREL, NEEDS RNFA   Patient Active Problem List   Diagnosis Date Noted   S/P total knee arthroplasty, left 06/06/2022   Degenerative arthritis of left knee 06/03/2022   Chronic obstructive pulmonary disease ([place]) 05/10/2022   Grade I diastolic dysfunction 07/01/1593   Postoperative fever 03/10/2022   S/P TKR (total knee replacement), right 03/07/2022   Osteoarthritis of right knee 03/04/2022   Precordial chest pain 04/20/2021   Former smoker  01/12/2021   PAF (paroxysmal atrial fibrillation) ([place]) 06/22/2020   Hypertension associated with type 2 diabetes mellitus ([place]) 05/17/2019   Hyperlipidemia associated with type 2 diabetes mellitus ([place]) 05/17/2019   GERD with esophagitis 05/17/2019   H/O total shoulder replacement, right 11/01/2018   Paroxysmal tachycardia ([place]) 01/11/2017   Nonrheumatic aortic valve stenosis 01/11/2017   Aortic atherosclerosis ([place]) 11/23/2016   HNP (herniated nucleus pulposus), lumbar 10/06/2016   Acute renal injury ([place]) 08/06/2016   Diarrhea 08/06/2016   Fever 08/06/2016   Hyperbilirubinemia 08/06/2016   Lactic acidosis 08/06/2016   Inguinal hernia 12/10/2015   Right groin pain 11/30/2015   Carpal tunnel syndrome on right 09/09/2015   Cervical spondylosis without myelopathy 09/09/2015   Thrombocytopenia ([place]) 07/21/2014   Bilateral carotid bruits 05/11/2014   Vitamin D deficiency 09/17/2013   BPH (benign prostatic hyperplasia) 05/22/2013   Low serum testosterone level 02/18/2013   Diabetes type 2, controlled ([place]) 01/10/2013   OSA (obstructive sleep apnea) 05/16/2012   Edema 02/21/2012   At risk for coronary artery disease 03/20/2011   Obesity 03/20/2011   Allergic rhinitis 06/22/2010   ASTHMA 06/22/2010   COUGH 06/22/2010    PCP: [name], DO  REFERRING PROVIDER: Frederik Pear, MD  REFERRING DIAG: s/p Left TKA   THERAPY DIAG:  Acute pain of left knee  Stiffness of left knee, not elsewhere classified  Rationale for Evaluation and Treatment Rehabilitation  ONSET DATE: 06/06/22  SUBJECTIVE:   SUBJECTIVE STATEMENT: Patient reports that his knee is still hurting.   PERTINENT HISTORY: HTN, DM, and OA  PAIN:  Are you having pain? Yes: NPRS scale: 6/10 Pain location: left knee Pain description: aching Aggravating factors: moving his knee and walking Relieving factors: medication and ice  PRECAUTIONS: None  WEIGHT BEARING RESTRICTIONS No  FALLS:  Has patient  fallen in last 6 months? No  LIVING ENVIRONMENT: Lives with: lives with their spouse Lives in: House/apartment Stairs: Yes: Internal: 20-25 steps; can reach both and External: 2 steps; can reach both Has following equipment at home: [place] - 2 wheeled  OCCUPATION: retired  PLOF: Independent  PATIENT GOALS: walking without assistive device, go outside an take pictures   OBJECTIVE:   PATIENT SURVEYS:  FOTO 16.73  COGNITION:  Overall cognitive status: Within functional limits for tasks assessed     SENSATION: Patient reports no numbness and tingling  EDEMA:  Moderate LLE edema (wearing compression stockings); no signs or symptoms of DVT  PALPATION: TTP: left patella and patellar tendon  LOWER EXTREMITY ROM:  Active ROM Right eval Left eval  Hip flexion    Hip extension    Hip abduction    Hip adduction    Hip internal rotation    Hip external rotation  Knee flexion 136 86/90 (PROM)  Knee extension 0 16  Ankle dorsiflexion    Ankle plantarflexion    Ankle inversion    Ankle eversion     (Blank rows = not tested)  LOWER EXTREMITY MMT: not tested due to surgical condition  FUNCTIONAL TESTS:  Required therapist assistance (modA) for sit to supine and sit to stand transfer   GAIT: Assistive device utilized: Environmental consultant - 2 wheeled Level of assistance: Modified independence Comments: step to pattern, poor foot clearance bilaterally, left knee flexed in stance, minimal left knee flexion in swing phase  TODAY'S TREATMENT:                                   10/16 EXERCISE LOG  Exercise Repetitions and Resistance Comments  Nustep L3 x 11 minutes; seat 14   Heel slide  25 reps   Quad set  25 reps w/ 5 second hold   Gastroc stretch 4 x 30 seconds        Blank cell = exercise not performed today  Modalities  Date:  Vaso: Knee, 34 degrees; low pressure, 15 mins, Pain and Edema   PATIENT EDUCATION:  Education details: POC, healing, prognosis, HEP Person  educated: Patient Education method: Explanation Education comprehension: verbalized understanding   HOME EXERCISE PROGRAM: HEP provided by his surgeon was reviewed  ASSESSMENT:  CLINICAL IMPRESSION: Patient was introduced to multiple new interventions for improved knee mobility with moderate difficulty. He required minimal cueing with quad sets for improved quadriceps engagement to facilitate improved knee mobility. His range of motion was limited by his increased left knee pain with today's interventions. He reported feeling better after modalities at the conclusion of treatment. He continues to require skilled physical therapy to address his remaining impairments to return to his prior level of function.   OBJECTIVE IMPAIRMENTS Abnormal gait, decreased activity tolerance, decreased endurance, decreased mobility, difficulty walking, decreased ROM, decreased strength, hypomobility, increased edema, impaired flexibility, and pain.   ACTIVITY LIMITATIONS carrying, lifting, standing, squatting, sleeping, stairs, transfers, bed mobility, bathing, toileting, dressing, and locomotion level  PARTICIPATION LIMITATIONS: meal prep, cleaning, laundry, driving, shopping, and community activity  PERSONAL FACTORS 3+ comorbidities: HTN, DM, and OA  are also affecting patient's functional outcome.   REHAB POTENTIAL: Good  CLINICAL DECISION MAKING: Stable/uncomplicated  EVALUATION COMPLEXITY: Low   GOALS: Goals reviewed with patient? No  SHORT TERM GOALS: Target date: 07/01/2022  Patient will be independent with his initial HEP.  Baseline: Goal status: INITIAL  2.  Patient will be able to demonstrate active left knee extension within 10 degrees of neutral for improved gait mechanics.  Baseline:  Goal status: INITIAL  3.  Patient will be able to walk with a cane or the least restrictive assistive device for at least 80 feet.  Baseline:  Goal status: INITIAL  4.  Patient will be able to  complete his daily activities without his familiar knee pain exceeding 6/10.  Baseline:  Goal status: INITIAL  LONG TERM GOALS: Target date: 07/22/2022   Patient will be independent with his advanced HEP.  Baseline:  Goal status: INITIAL  2.  Patient will be able to demonstrate active left knee extension within 5 degrees of neutral for improved gait mechanics.  Baseline:  Goal status: INITIAL  3.  Patient will be able to demonstrate at least 120 degree of active left knee flexion for improved function navigating stairs.  Baseline:  Goal status: INITIAL  4.  Patient will be able to navigate at least 4 steps with a reciprocal pattern for improved function with household navigation.  Baseline:  Goal status: INITIAL  5.  Patient will be able to complete his daily activities without his familiar pain exceeding 4/10.  Baseline:  Goal status: INITIAL  PLAN: PT FREQUENCY: 2-3x/week  PT DURATION: 6 weeks  PLANNED INTERVENTIONS: Therapeutic exercises, Therapeutic activity, Neuromuscular re-education, Balance training, Gait training, Patient/Family education, Self Care, Joint mobilization, Stair training, Electrical stimulation, Cryotherapy, Moist heat, Vasopneumatic device, Manual therapy, and Re-evaluation  PLAN FOR NEXT SESSION: nustep, quad sets, heel slides, gastroc stretch, manual therapy, and modalities as needed   Darlin Coco, PT 06/13/2022, 3:29 PM

## 2022-06-14 ENCOUNTER — Ambulatory Visit: Payer: BC Managed Care – PPO

## 2022-06-14 DIAGNOSIS — M25562 Pain in left knee: Secondary | ICD-10-CM | POA: Diagnosis not present

## 2022-06-14 DIAGNOSIS — M25662 Stiffness of left knee, not elsewhere classified: Secondary | ICD-10-CM | POA: Diagnosis not present

## 2022-06-14 NOTE — Therapy (Signed)
OUTPATIENT PHYSICAL THERAPY LOWER EXTREMITY TREATMENT   Patient Name: Tony Cantu MRN: [id number] DOB:1950-11-03, 71 y.o., male Today's Date: 06/14/2022   PT End of Session - 06/14/22 1436     Visit Number 3    Number of Visits 15    Date for PT Re-Evaluation 07/22/22    PT Start Time 53    PT Stop Time 6834    PT Time Calculation (min) 61 min    Activity Tolerance Patient tolerated treatment well    Behavior During Therapy [facility] for tasks assessed/performed               Past Medical History:  Diagnosis Date   Aortic atherosclerosis ([place])    Aortic stenosis    Arthritis    Asthma    BPH (benign prostatic hypertrophy)    Cirrhosis ([place]) 2016   Colon polyps    Diabetes mellitus without complication ([place])    TYPE 1    Diverticulosis    Dysrhythmia 04/19/2021   A-fib noted with possible A-fib RVR   Gastric ulcer    Gastric varices    right   Gastritis    GERD (gastroesophageal reflux disease)    Headache    History- pt states these were migraines that occurred in the 1980's   Heart murmur    History of kidney stones    Hx of adenomatous colonic polyps    Hypertension    PONV (postoperative nausea and vomiting)    pt states he has never had post op nausea or vomiting   Portal hypertensive gastropathy ([place])    Renal cyst, right    Seizures (HCC)    HYPOGLYCEMIC LAST 1 AND 1/2 YRS AGO   Sleep apnea    uses CPAP nightly   Testicle trouble    one testicle BORN WITH   Tubular adenoma of colon    Past Surgical History:  Procedure Laterality Date   BACK SURGERY     94  LOWER    CARPAL TUNNEL RELEASE Right 10/13/2015   Procedure: RIGHT CARPAL TUNNEL RELEASE;  Surgeon: Daryll Brod, MD;  Location: [place];  Service: Orthopedics;  Laterality: Right;   CARPAL TUNNEL RELEASE Left 07/05/2016   Procedure: LEFT CARPAL TUNNEL RELEASE;  Surgeon: Daryll Brod, MD;  Location: [place];  Service: Orthopedics;  Laterality: Left;   DRUG  INDUCED ENDOSCOPY N/A 07/02/2021   Procedure: DRUG INDUCED SLEEP ENDOSCOPY;  Surgeon: Melida Quitter, MD;  Location: [place];  Service: ENT;  Laterality: N/A;   IMPLANTATION OF HYPOGLOSSAL NERVE STIMULATOR Right 10/01/2021   Procedure: IMPLANTATION OF HYPOGLOSSAL NERVE STIMULATOR;  Surgeon: Melida Quitter, MD;  Location: [place];  Service: ENT;  Laterality: Right;   INGUINAL HERNIA REPAIR  2003   right    KNEE ARTHROSCOPY Right 03/03/2016   LUMBAR DISC SURGERY  03/1995   Dr. Coralyn Mark, discectomy   LUMBAR LAMINECTOMY/DECOMPRESSION MICRODISCECTOMY Right 10/06/2016   Procedure: RIGHT LUMBAR THREE - LUMBAR FOUR  LAMINECTOMY, FORAMINOTOMY AND MICRODISCECTOMY;  Surgeon: Jovita Gamma, MD;  Location: [place];  Service: Neurosurgery;  Laterality: Right;   SHOULDER SURGERY  11/28/2005   left partial   SHOULDER SURGERY  07/14/2006   RIGHT   TONSILLECTOMY  AGE 49 OR 4   TOTAL KNEE ARTHROPLASTY Right 03/07/2022   Procedure: RIGHT TOTAL KNEE ARTHROPLASTY;  Surgeon: Frederik Pear, MD;  [facility];  Service: Orthopedics;  Laterality: Right;   TOTAL KNEE ARTHROPLASTY Left 06/06/2022   Procedure: LEFT TOTAL KNEE  ARTHROPLASTY;  Surgeon: Frederik Pear, MD;  [facility];  Service: Orthopedics;  Laterality: Left;   TOTAL SHOULDER ARTHROPLASTY Right 11/01/2018   Procedure: RIGHT SHOULDER REVISION TO REVERSE TOTAL SHOULDER;  Surgeon: Tania Ade, MD;  [facility];  Service: Orthopedics;  Laterality: Right;  CHOICE ANESTHESIA WITH INTERSCALENE BLOCK EXPAREL, NEEDS RNFA   Patient Active Problem List   Diagnosis Date Noted   S/P total knee arthroplasty, left 06/06/2022   Degenerative arthritis of left knee 06/03/2022   Chronic obstructive pulmonary disease ([place]) 05/10/2022   Grade I diastolic dysfunction 67/34/1937   Postoperative fever 03/10/2022   S/P TKR (total knee replacement), right 03/07/2022   Osteoarthritis of right knee 03/04/2022   Precordial chest pain 04/20/2021   Former smoker  01/12/2021   PAF (paroxysmal atrial fibrillation) ([place]) 06/22/2020   Hypertension associated with type 2 diabetes mellitus ([place]) 05/17/2019   Hyperlipidemia associated with type 2 diabetes mellitus ([place]) 05/17/2019   GERD with esophagitis 05/17/2019   H/O total shoulder replacement, right 11/01/2018   Paroxysmal tachycardia ([place]) 01/11/2017   Nonrheumatic aortic valve stenosis 01/11/2017   Aortic atherosclerosis ([place]) 11/23/2016   HNP (herniated nucleus pulposus), lumbar 10/06/2016   Acute renal injury ([place]) 08/06/2016   Diarrhea 08/06/2016   Fever 08/06/2016   Hyperbilirubinemia 08/06/2016   Lactic acidosis 08/06/2016   Inguinal hernia 12/10/2015   Right groin pain 11/30/2015   Carpal tunnel syndrome on right 09/09/2015   Cervical spondylosis without myelopathy 09/09/2015   Thrombocytopenia ([place]) 07/21/2014   Bilateral carotid bruits 05/11/2014   Vitamin D deficiency 09/17/2013   BPH (benign prostatic hyperplasia) 05/22/2013   Low serum testosterone level 02/18/2013   Diabetes type 2, controlled ([place]) 01/10/2013   OSA (obstructive sleep apnea) 05/16/2012   Edema 02/21/2012   At risk for coronary artery disease 03/20/2011   Obesity 03/20/2011   Allergic rhinitis 06/22/2010   ASTHMA 06/22/2010   COUGH 06/22/2010    PCP: [name], DO  REFERRING PROVIDER: Frederik Pear, MD  REFERRING DIAG: s/p Left TKA   THERAPY DIAG:  Acute pain of left knee  Stiffness of left knee, not elsewhere classified  Rationale for Evaluation and Treatment Rehabilitation  ONSET DATE: 06/06/22  SUBJECTIVE:   SUBJECTIVE STATEMENT: Patient reports that his knee is still really tight today.   PERTINENT HISTORY: HTN, DM, and OA  PAIN:  Are you having pain? Yes: NPRS scale: 3/10 Pain location: left knee Pain description: aching Aggravating factors: moving his knee and walking Relieving factors: medication and ice  PRECAUTIONS: None  WEIGHT BEARING RESTRICTIONS No  FALLS:   Has patient fallen in last 6 months? No  LIVING ENVIRONMENT: Lives with: lives with their spouse Lives in: House/apartment Stairs: Yes: Internal: 20-25 steps; can reach both and External: 2 steps; can reach both Has following equipment at home: [place] - 2 wheeled  OCCUPATION: retired  PLOF: Independent  PATIENT GOALS: walking without assistive device, go outside an take pictures   OBJECTIVE: all objective measures were performed at his initial evaluation on 06/10/22 unless otherwise noted  PATIENT SURVEYS:  FOTO 16.73  COGNITION:  Overall cognitive status: Within functional limits for tasks assessed     SENSATION: Patient reports no numbness and tingling  EDEMA:  Moderate LLE edema (wearing compression stockings); no signs or symptoms of DVT  PALPATION: TTP: left patella and patellar tendon  LOWER EXTREMITY ROM:  Active ROM Right eval Left eval  Hip flexion    Hip extension    Hip abduction  Hip adduction    Hip internal rotation    Hip external rotation    Knee flexion 136 86/90 (PROM)  Knee extension 0 16  Ankle dorsiflexion    Ankle plantarflexion    Ankle inversion    Ankle eversion     (Blank rows = not tested)  LOWER EXTREMITY MMT: not tested due to surgical condition  FUNCTIONAL TESTS:  Required therapist assistance (modA) for sit to supine and sit to stand transfer   GAIT: Assistive device utilized: Environmental consultant - 2 wheeled Level of assistance: Modified independence Comments: step to pattern, poor foot clearance bilaterally, left knee flexed in stance, minimal left knee flexion in swing phase  TODAY'S TREATMENT:                                   10/17 EXERCISE LOG  Exercise Repetitions and Resistance Comments  Nustep L3 x 15 minutes; seat 12-11   Gastroc stretch  2 x 30 seconds   Lunges onto step  4" step x 20 reps            Blank cell = exercise not performed today  Manual Therapy Soft Tissue Mobilization: left quadriceps, hamstring,  and IT band, for improved knee mobility (98 degrees left knee flexion AROM after manual therapy) Passive ROM: flexion and extension, to tolerance   Modalities  Date:  Vaso: Knee, 34 degrees; low pressure, 10 mins, Pain and Edema                                   10/16 EXERCISE LOG  Exercise Repetitions and Resistance Comments  Nustep L3 x 11 minutes; seat 14   Heel slide  25 reps   Quad set  25 reps w/ 5 second hold   Gastroc stretch 4 x 30 seconds        Blank cell = exercise not performed today  Modalities  Date:  Vaso: Knee, 34 degrees; low pressure, 15 mins, Pain and Edema   PATIENT EDUCATION:  Education details: POC, healing, prognosis, HEP Person educated: Patient Education method: Explanation Education comprehension: verbalized understanding   HOME EXERCISE PROGRAM: HEP provided by his surgeon was reviewed  ASSESSMENT:  CLINICAL IMPRESSION: Patient was introduced to a standing gastroc stretch and lunges onto a 4" step for improved knee mobility. He required minimal cueing with lunges for proper exercise performance to facilitate improved knee mobility. Manual therapy focused on improved knee mobility through the use of PROM and soft tissue mobilization to the left quadriceps and hamstrings. He was able to demonstrate 98 degrees of active left knee flexion after the conclusion of manual therapy. He reported that his knee felt about the same upon the conclusion of treatment. He continues to require skilled physical therapy to address his remaining impairments to return to his prior level of function.   OBJECTIVE IMPAIRMENTS Abnormal gait, decreased activity tolerance, decreased endurance, decreased mobility, difficulty walking, decreased ROM, decreased strength, hypomobility, increased edema, impaired flexibility, and pain.   ACTIVITY LIMITATIONS carrying, lifting, standing, squatting, sleeping, stairs, transfers, bed mobility, bathing, toileting, dressing, and locomotion  level  PARTICIPATION LIMITATIONS: meal prep, cleaning, laundry, driving, shopping, and community activity  PERSONAL FACTORS 3+ comorbidities: HTN, DM, and OA  are also affecting patient's functional outcome.   REHAB POTENTIAL: Good  CLINICAL DECISION MAKING: Stable/uncomplicated  EVALUATION COMPLEXITY: Low  GOALS: Goals reviewed with patient? No  SHORT TERM GOALS: Target date: 07/01/2022  Patient will be independent with his initial HEP.  Baseline: Goal status: INITIAL  2.  Patient will be able to demonstrate active left knee extension within 10 degrees of neutral for improved gait mechanics.  Baseline:  Goal status: INITIAL  3.  Patient will be able to walk with a cane or the least restrictive assistive device for at least 80 feet.  Baseline:  Goal status: INITIAL  4.  Patient will be able to complete his daily activities without his familiar knee pain exceeding 6/10.  Baseline:  Goal status: INITIAL  LONG TERM GOALS: Target date: 07/22/2022   Patient will be independent with his advanced HEP.  Baseline:  Goal status: INITIAL  2.  Patient will be able to demonstrate active left knee extension within 5 degrees of neutral for improved gait mechanics.  Baseline:  Goal status: INITIAL  3.  Patient will be able to demonstrate at least 120 degree of active left knee flexion for improved function navigating stairs.  Baseline:  Goal status: INITIAL  4.  Patient will be able to navigate at least 4 steps with a reciprocal pattern for improved function with household navigation.  Baseline:  Goal status: INITIAL  5.  Patient will be able to complete his daily activities without his familiar pain exceeding 4/10.  Baseline:  Goal status: INITIAL  PLAN: PT FREQUENCY: 2-3x/week  PT DURATION: 6 weeks  PLANNED INTERVENTIONS: Therapeutic exercises, Therapeutic activity, Neuromuscular re-education, Balance training, Gait training, Patient/Family education, Self Care, Joint  mobilization, Stair training, Electrical stimulation, Cryotherapy, Moist heat, Vasopneumatic device, Manual therapy, and Re-evaluation  PLAN FOR NEXT SESSION: nustep, quad sets, heel slides, gastroc stretch, manual therapy, and modalities as needed   Darlin Coco, PT 06/14/2022, 3:43 PM

## 2022-06-16 DIAGNOSIS — Z96652 Presence of left artificial knee joint: Secondary | ICD-10-CM | POA: Diagnosis not present

## 2022-06-17 ENCOUNTER — Ambulatory Visit: Payer: BC Managed Care – PPO | Admitting: *Deleted

## 2022-06-17 ENCOUNTER — Encounter: Payer: Self-pay | Admitting: *Deleted

## 2022-06-17 DIAGNOSIS — M25662 Stiffness of left knee, not elsewhere classified: Secondary | ICD-10-CM

## 2022-06-17 DIAGNOSIS — M25562 Pain in left knee: Secondary | ICD-10-CM

## 2022-06-17 NOTE — Therapy (Signed)
OUTPATIENT PHYSICAL THERAPY LOWER EXTREMITY TREATMENT   Patient Name: Tony Cantu MRN: [id number] DOB:[birth year], 71 y.o., male Today's Date: 06/17/2022   PT End of Session - 06/17/22 1134     Visit Number 4    Number of Visits 15    Date for PT Re-Evaluation 07/22/22    PT Start Time 1115    PT Stop Time 1205    PT Time Calculation (min) 50 min               Past Medical History:  Diagnosis Date   Aortic atherosclerosis ([place])    Aortic stenosis    Arthritis    Asthma    BPH (benign prostatic hypertrophy)    Cirrhosis ([place]) 2016   Colon polyps    Diabetes mellitus without complication ([place])    TYPE 1    Diverticulosis    Dysrhythmia 04/19/2021   A-fib noted with possible A-fib RVR   Gastric ulcer    Gastric varices    right   Gastritis    GERD (gastroesophageal reflux disease)    Headache    History- pt states these were migraines that occurred in the 1980's   Heart murmur    History of kidney stones    Hx of adenomatous colonic polyps    Hypertension    PONV (postoperative nausea and vomiting)    pt states he has never had post op nausea or vomiting   Portal hypertensive gastropathy ([place])    Renal cyst, right    Seizures (H[facility])    HYPOGLYCEMIC LAST 1 AND 1/2 YRS AGO   Sleep apnea    uses CPAP nightly   Testicle trouble    one testicle BORN WITH   Tubular adenoma of colon    Past Surgical History:  Procedure Laterality Date   BACK SURGERY     89  LOWER    CARPAL TUNNEL RELEASE Right 10/13/2015   Procedure: RIGHT CARPAL TUNNEL RELEASE;  Surgeon: Daryll Brod, MD;  Location: [place];  Service: Orthopedics;  Laterality: Right;   CARPAL TUNNEL RELEASE Left 07/05/2016   Procedure: LEFT CARPAL TUNNEL RELEASE;  Surgeon: Daryll Brod, MD;  Location: [place];  Service: Orthopedics;  Laterality: Left;   DRUG INDUCED ENDOSCOPY N/A 07/02/2021   Procedure: DRUG INDUCED SLEEP ENDOSCOPY;  Surgeon: Melida Quitter, MD;  Location:  [place];  Service: ENT;  Laterality: N/A;   IMPLANTATION OF HYPOGLOSSAL NERVE STIMULATOR Right 10/01/2021   Procedure: IMPLANTATION OF HYPOGLOSSAL NERVE STIMULATOR;  Surgeon: Melida Quitter, MD;  Location: [place];  Service: ENT;  Laterality: Right;   INGUINAL HERNIA REPAIR  2003   right    KNEE ARTHROSCOPY Right 03/03/2016   LUMBAR DISC SURGERY  03/1995   Dr. Coralyn Mark, discectomy   LUMBAR LAMINECTOMY/DECOMPRESSION MICRODISCECTOMY Right 10/06/2016   Procedure: RIGHT LUMBAR THREE - LUMBAR FOUR  LAMINECTOMY, FORAMINOTOMY AND MICRODISCECTOMY;  Surgeon: Jovita Gamma, MD;  Location: [place];  Service: Neurosurgery;  Laterality: Right;   SHOULDER SURGERY  11/28/2005   left partial   SHOULDER SURGERY  07/14/2006   RIGHT   TONSILLECTOMY  AGE 27 OR 4   TOTAL KNEE ARTHROPLASTY Right 03/07/2022   Procedure: RIGHT TOTAL KNEE ARTHROPLASTY;  Surgeon: Frederik Pear, MD;  Location: [facility];  Service: Orthopedics;  Laterality: Right;   TOTAL KNEE ARTHROPLASTY Left 06/06/2022   Procedure: LEFT TOTAL KNEE ARTHROPLASTY;  Surgeon: Frederik Pear, MD;  Location: [facility];  Service: Orthopedics;  Laterality: Left;   TOTAL  SHOULDER ARTHROPLASTY Right 11/01/2018   Procedure: RIGHT SHOULDER REVISION TO REVERSE TOTAL SHOULDER;  Surgeon: Tania Ade, MD;  Location[facility];  Service: Orthopedics;  Laterality: Right;  CHOICE ANESTHESIA WITH INTERSCALENE BLOCK EXPAREL, NEEDS RNFA   Patient Active Problem List   Diagnosis Date Noted   S/P total knee arthroplasty, left 06/06/2022   Degenerative arthritis of left knee 06/03/2022   Chronic obstructive pulmonary disease ([place]) 05/10/2022   Grade I diastolic dysfunction 36/62/9476   Postoperative fever 03/10/2022   S/P TKR (total knee replacement), right 03/07/2022   Osteoarthritis of right knee 03/04/2022   Precordial chest pain 04/20/2021   Former smoker 01/12/2021   PAF (paroxysmal atrial fibrillation) ([place]) 06/22/2020   Hypertension associated with type 2 diabetes mellitus  ([place]) 05/17/2019   Hyperlipidemia associated with type 2 diabetes mellitus ([place]) 05/17/2019   GERD with esophagitis 05/17/2019   H/O total shoulder replacement, right 11/01/2018   Paroxysmal tachycardia ([place]) 01/11/2017   Nonrheumatic aortic valve stenosis 01/11/2017   Aortic atherosclerosis ([place]) 11/23/2016   HNP (herniated nucleus pulposus), lumbar 10/06/2016   Acute renal injury ([place]) 08/06/2016   Diarrhea 08/06/2016   Fever 08/06/2016   Hyperbilirubinemia 08/06/2016   Lactic acidosis 08/06/2016   Inguinal hernia 12/10/2015   Right groin pain 11/30/2015   Carpal tunnel syndrome on right 09/09/2015   Cervical spondylosis without myelopathy 09/09/2015   Thrombocytopenia ([place]) 07/21/2014   Bilateral carotid bruits 05/11/2014   Vitamin D deficiency 09/17/2013   BPH (benign prostatic hyperplasia) 05/22/2013   Low serum testosterone level 02/18/2013   Diabetes type 2, controlled ([place]) 01/10/2013   OSA (obstructive sleep apnea) 05/16/2012   Edema 02/21/2012   At risk for coronary artery disease 03/20/2011   Obesity 03/20/2011   Allergic rhinitis 06/22/2010   ASTHMA 06/22/2010   COUGH 06/22/2010    PCP: [name], DO  REFERRING PROVIDER: Frederik Pear, MD  REFERRING DIAG: s/p Left TKA   THERAPY DIAG:  Acute pain of left knee  Stiffness of left knee, not elsewhere classified  Rationale for Evaluation and Treatment Rehabilitation  ONSET DATE: 06/06/22  SUBJECTIVE:   SUBJECTIVE STATEMENT: Patient reports that his knee is still really tight today. Went to MD yesterday and was pleased with LT knee  PERTINENT HISTORY: HTN, DM, and OA  PAIN:  Are you having pain? Yes: NPRS scale: 3/10 Pain location: left knee Pain description: aching Aggravating factors: moving his knee and walking Relieving factors: medication and ice  PRECAUTIONS: None  WEIGHT BEARING RESTRICTIONS No  FALLS:  Has patient fallen in last 6 months? No  LIVING ENVIRONMENT: Lives with:  lives with their spouse Lives in: House/apartment Stairs: Yes: Internal: 20-25 steps; can reach both and External: 2 steps; can reach both Has following equipment at home: Gilford Rile - 2 wheeled  OCCUPATION: retired  PLOF: Independent  PATIENT GOALS: walking without assistive device, go outside an take pictures   OBJECTIVE: all objective measures were performed at his initial evaluation on 06/10/22 unless otherwise noted  PATIENT SURVEYS:  FOTO 16.73  COGNITION:  Overall cognitive status: Within functional limits for tasks assessed     SENSATION: Patient reports no numbness and tingling  EDEMA:  Moderate LLE edema (wearing compression stockings); no signs or symptoms of DVT  PALPATION: TTP: left patella and patellar tendon  LOWER EXTREMITY ROM:  Active ROM Right eval Left eval  Hip flexion    Hip extension    Hip abduction    Hip adduction    Hip internal rotation  Hip external rotation    Knee flexion 136 86/90 (PROM)  Knee extension 0 16  Ankle dorsiflexion    Ankle plantarflexion    Ankle inversion    Ankle eversion     (Blank rows = not tested)  LOWER EXTREMITY MMT: not tested due to surgical condition  FUNCTIONAL TESTS:  Required therapist assistance (modA) for sit to supine and sit to stand transfer   GAIT: Assistive device utilized: Environmental consultant - 2 wheeled Level of assistance: Modified independence Comments: step to pattern, poor foot clearance bilaterally, left knee flexed in stance, minimal left knee flexion in swing phase  TODAY'S TREATMENT:                                   10/20 EXERCISE LOG  Exercise Repetitions and Resistance Comments  Nustep L3 x 15 minutes; seat 12-11   Gastroc stretch     Lunges onto step     LAQ's X10 hold 5 secs   HS curls 2x10   Heel ups/Toe Ups X10 each    Blank cell = exercise not performed today  Manual Therapy Soft Tissue Mobilization: gentle scar mobs and patella mobs,    Passive ROM: flexion and extension, to  tolerance   Modalities  Date:  Vaso: Knee, 34 degrees; low pressure, 10 mins, Pain and Edema                                   10/16 EXERCISE LOG  Exercise Repetitions and Resistance Comments  Nustep L3 x 11 minutes; seat 14   Heel slide  25 reps   Quad set  25 reps w/ 5 second hold   Gastroc stretch 4 x 30 seconds        Blank cell = exercise not performed today  Modalities  Date:  Vaso: Knee, 34 degrees; low pressure, 15 mins, Pain and Edema   PATIENT EDUCATION:  Education details: POC, healing, prognosis, HEP Person educated: Patient Education method: Explanation Education comprehension: verbalized understanding   HOME EXERCISE PROGRAM: HEP provided by his surgeon was reviewed  ASSESSMENT:  CLINICAL IMPRESSION: Pt arrived today doing fairly well with LT knee and reports f/u with MD yesterday with good reports.Rx  focused on ROM progression and muscle activation for quads and HS's. 98 degrees reached for flexion. Vaso end of session for edema    OBJECTIVE IMPAIRMENTS Abnormal gait, decreased activity tolerance, decreased endurance, decreased mobility, difficulty walking, decreased ROM, decreased strength, hypomobility, increased edema, impaired flexibility, and pain.   ACTIVITY LIMITATIONS carrying, lifting, standing, squatting, sleeping, stairs, transfers, bed mobility, bathing, toileting, dressing, and locomotion level  PARTICIPATION LIMITATIONS: meal prep, cleaning, laundry, driving, shopping, and community activity  PERSONAL FACTORS 3+ comorbidities: HTN, DM, and OA  are also affecting patient's functional outcome.   REHAB POTENTIAL: Good  CLINICAL DECISION MAKING: Stable/uncomplicated  EVALUATION COMPLEXITY: Low   GOALS: Goals reviewed with patient? No  SHORT TERM GOALS: Target date: 07/01/2022  Patient will be independent with his initial HEP.  Baseline: Goal status: INITIAL  2.  Patient will be able to demonstrate active left knee extension within  10 degrees of neutral for improved gait mechanics.  Baseline:  Goal status: INITIAL  3.  Patient will be able to walk with a cane or the least restrictive assistive device for at least 80 feet.  Baseline:  Goal  status: INITIAL  4.  Patient will be able to complete his daily activities without his familiar knee pain exceeding 6/10.  Baseline:  Goal status: INITIAL  LONG TERM GOALS: Target date: 07/22/2022   Patient will be independent with his advanced HEP.  Baseline:  Goal status: INITIAL  2.  Patient will be able to demonstrate active left knee extension within 5 degrees of neutral for improved gait mechanics.  Baseline:  Goal status: INITIAL  3.  Patient will be able to demonstrate at least 120 degree of active left knee flexion for improved function navigating stairs.  Baseline:  Goal status: INITIAL  4.  Patient will be able to navigate at least 4 steps with a reciprocal pattern for improved function with household navigation.  Baseline:  Goal status: INITIAL  5.  Patient will be able to complete his daily activities without his familiar pain exceeding 4/10.  Baseline:  Goal status: INITIAL  PLAN: PT FREQUENCY: 2-3x/week  PT DURATION: 6 weeks  PLANNED INTERVENTIONS: Therapeutic exercises, Therapeutic activity, Neuromuscular re-education, Balance training, Gait training, Patient/Family education, Self Care, Joint mobilization, Stair training, Electrical stimulation, Cryotherapy, Moist heat, Vasopneumatic device, Manual therapy, and Re-evaluation  PLAN FOR NEXT SESSION: nustep, quad sets, heel slides, gastroc stretch, manual therapy, and modalities as needed   Laurissa Cowper,CHRIS, PTA 06/17/2022, 12:23 PM

## 2022-06-20 ENCOUNTER — Ambulatory Visit: Payer: BC Managed Care – PPO

## 2022-06-20 DIAGNOSIS — M25662 Stiffness of left knee, not elsewhere classified: Secondary | ICD-10-CM

## 2022-06-20 DIAGNOSIS — M25562 Pain in left knee: Secondary | ICD-10-CM

## 2022-06-20 NOTE — Therapy (Signed)
OUTPATIENT PHYSICAL THERAPY LOWER EXTREMITY TREATMENT   Patient Name: Tony Cantu MRN: [id number] DOB:1950-09-16, 71 y.o., male Today's Date: 06/20/2022   PT End of Session - 06/20/22 1441     Visit Number 5    Number of Visits 15    Date for PT Re-Evaluation 07/22/22    PT Start Time 4008    PT Stop Time 6761    PT Time Calculation (min) 41 min    Activity Tolerance Patient tolerated treatment well    Behavior During Therapy [facility] for tasks assessed/performed                Past Medical History:  Diagnosis Date   Aortic atherosclerosis ([place])    Aortic stenosis    Arthritis    Asthma    BPH (benign prostatic hypertrophy)    Cirrhosis ([place]) 2016   Colon polyps    Diabetes mellitus without complication ([place])    TYPE 1    Diverticulosis    Dysrhythmia 04/19/2021   A-fib noted with possible A-fib RVR   Gastric ulcer    Gastric varices    right   Gastritis    GERD (gastroesophageal reflux disease)    Headache    History- pt states these were migraines that occurred in the 1980's   Heart murmur    History of kidney stones    Hx of adenomatous colonic polyps    Hypertension    PONV (postoperative nausea and vomiting)    pt states he has never had post op nausea or vomiting   Portal hypertensive gastropathy ([place])    Renal cyst, right    Seizures (HCC)    HYPOGLYCEMIC LAST 1 AND 1/2 YRS AGO   Sleep apnea    uses CPAP nightly   Testicle trouble    one testicle BORN WITH   Tubular adenoma of colon    Past Surgical History:  Procedure Laterality Date   BACK SURGERY     89  LOWER    CARPAL TUNNEL RELEASE Right 10/13/2015   Procedure: RIGHT CARPAL TUNNEL RELEASE;  Surgeon: Daryll Brod, MD;  Location: [place];  Service: Orthopedics;  Laterality: Right;   CARPAL TUNNEL RELEASE Left 07/05/2016   Procedure: LEFT CARPAL TUNNEL RELEASE;  Surgeon: Daryll Brod, MD;  Location: [place];  Service: Orthopedics;  Laterality: Left;   DRUG  INDUCED ENDOSCOPY N/A 07/02/2021   Procedure: DRUG INDUCED SLEEP ENDOSCOPY;  Surgeon: Melida Quitter, MD;  Location: [place];  Service: ENT;  Laterality: N/A;   IMPLANTATION OF HYPOGLOSSAL NERVE STIMULATOR Right 10/01/2021   Procedure: IMPLANTATION OF HYPOGLOSSAL NERVE STIMULATOR;  Surgeon: Melida Quitter, MD;  Location: [place];  Service: ENT;  Laterality: Right;   INGUINAL HERNIA REPAIR  2003   right    KNEE ARTHROSCOPY Right 03/03/2016   LUMBAR DISC SURGERY  03/1995   Dr. Coralyn Mark, discectomy   LUMBAR LAMINECTOMY/DECOMPRESSION MICRODISCECTOMY Right 10/06/2016   Procedure: RIGHT LUMBAR THREE - LUMBAR FOUR  LAMINECTOMY, FORAMINOTOMY AND MICRODISCECTOMY;  Surgeon: Jovita Gamma, MD;  Location: [place];  Service: Neurosurgery;  Laterality: Right;   SHOULDER SURGERY  11/28/2005   left partial   SHOULDER SURGERY  07/14/2006   RIGHT   TONSILLECTOMY  AGE 85 OR 4   TOTAL KNEE ARTHROPLASTY Right 03/07/2022   Procedure: RIGHT TOTAL KNEE ARTHROPLASTY;  Surgeon: Frederik Pear, MD;  [facility];  Service: Orthopedics;  Laterality: Right;   TOTAL KNEE ARTHROPLASTY Left 06/06/2022   Procedure: LEFT TOTAL  KNEE ARTHROPLASTY;  Surgeon: Frederik Pear, MD;  [facility];  Service: Orthopedics;  Laterality: Left;   TOTAL SHOULDER ARTHROPLASTY Right 11/01/2018   Procedure: RIGHT SHOULDER REVISION TO REVERSE TOTAL SHOULDER;  Surgeon: Tania Ade, MD;  [facility];  Service: Orthopedics;  Laterality: Right;  CHOICE ANESTHESIA WITH INTERSCALENE BLOCK EXPAREL, NEEDS RNFA   Patient Active Problem List   Diagnosis Date Noted   S/P total knee arthroplasty, left 06/06/2022   Degenerative arthritis of left knee 06/03/2022   Chronic obstructive pulmonary disease ([place]) 05/10/2022   Grade I diastolic dysfunction 68/07/7516   Postoperative fever 03/10/2022   S/P TKR (total knee replacement), right 03/07/2022   Osteoarthritis of right knee 03/04/2022   Precordial chest pain 04/20/2021   Former smoker  01/12/2021   PAF (paroxysmal atrial fibrillation) ([place]) 06/22/2020   Hypertension associated with type 2 diabetes mellitus ([place]) 05/17/2019   Hyperlipidemia associated with type 2 diabetes mellitus ([place]) 05/17/2019   GERD with esophagitis 05/17/2019   H/O total shoulder replacement, right 11/01/2018   Paroxysmal tachycardia ([place]) 01/11/2017   Nonrheumatic aortic valve stenosis 01/11/2017   Aortic atherosclerosis ([place]) 11/23/2016   HNP (herniated nucleus pulposus), lumbar 10/06/2016   Acute renal injury ([place]) 08/06/2016   Diarrhea 08/06/2016   Fever 08/06/2016   Hyperbilirubinemia 08/06/2016   Lactic acidosis 08/06/2016   Inguinal hernia 12/10/2015   Right groin pain 11/30/2015   Carpal tunnel syndrome on right 09/09/2015   Cervical spondylosis without myelopathy 09/09/2015   Thrombocytopenia ([place]) 07/21/2014   Bilateral carotid bruits 05/11/2014   Vitamin D deficiency 09/17/2013   BPH (benign prostatic hyperplasia) 05/22/2013   Low serum testosterone level 02/18/2013   Diabetes type 2, controlled ([place]) 01/10/2013   OSA (obstructive sleep apnea) 05/16/2012   Edema 02/21/2012   At risk for coronary artery disease 03/20/2011   Obesity 03/20/2011   Allergic rhinitis 06/22/2010   ASTHMA 06/22/2010   COUGH 06/22/2010    PCP: [name], DO  REFERRING PROVIDER: Frederik Pear, MD  REFERRING DIAG: s/p Left TKA   THERAPY DIAG:  Acute pain of left knee  Stiffness of left knee, not elsewhere classified  Rationale for Evaluation and Treatment Rehabilitation  ONSET DATE: 06/06/22  SUBJECTIVE:   SUBJECTIVE STATEMENT: Patient reports that his knee is still hurting some, but it is from healing.   PERTINENT HISTORY: HTN, DM, and OA  PAIN:  Are you having pain? Yes: NPRS scale: 2/10 Pain location: left knee Pain description: aching Aggravating factors: moving his knee and walking Relieving factors: medication and ice  PRECAUTIONS: None  WEIGHT BEARING  RESTRICTIONS No  FALLS:  Has patient fallen in last 6 months? No  LIVING ENVIRONMENT: Lives with: lives with their spouse Lives in: House/apartment Stairs: Yes: Internal: 20-25 steps; can reach both and External: 2 steps; can reach both Has following equipment at home: Gilford Rile - 2 wheeled  OCCUPATION: retired  PLOF: Independent  PATIENT GOALS: walking without assistive device, go outside an take pictures   OBJECTIVE: all objective measures were performed at his initial evaluation on 06/10/22 unless otherwise noted  PATIENT SURVEYS:  FOTO 16.73  COGNITION:  Overall cognitive status: Within functional limits for tasks assessed     SENSATION: Patient reports no numbness and tingling  EDEMA:  Moderate LLE edema (wearing compression stockings); no signs or symptoms of DVT  PALPATION: TTP: left patella and patellar tendon  LOWER EXTREMITY ROM:  Active ROM Right eval Left eval  Hip flexion    Hip extension  Hip abduction    Hip adduction    Hip internal rotation    Hip external rotation    Knee flexion 136 86/90 (PROM)  Knee extension 0 16  Ankle dorsiflexion    Ankle plantarflexion    Ankle inversion    Ankle eversion     (Blank rows = not tested)  LOWER EXTREMITY MMT: not tested due to surgical condition  FUNCTIONAL TESTS:  Required therapist assistance (modA) for sit to supine and sit to stand transfer   GAIT: Assistive device utilized: Environmental consultant - 2 wheeled Level of assistance: Modified independence Comments: step to pattern, poor foot clearance bilaterally, left knee flexed in stance, minimal left knee flexion in swing phase  TODAY'S TREATMENT:                                   10/23 EXERCISE LOG  Exercise Repetitions and Resistance Comments  Nustep  L4 x 20 minutes; seat 11-9    Gastroc stretch  2 x 30 seconds   LAQ 30 reps            Blank cell = exercise not performed today  Modalities  Date:  Vaso: Knee, 34 degrees; low pressure, 10 mins,  Pain and Edema                                   10/20 EXERCISE LOG  Exercise Repetitions and Resistance Comments  Nustep L3 x 15 minutes; seat 12-11   Gastroc stretch     Lunges onto step     LAQ's X10 hold 5 secs   HS curls 2x10   Heel ups/Toe Ups X10 each    Blank cell = exercise not performed today  Manual Therapy Soft Tissue Mobilization: gentle scar mobs and patella mobs,    Passive ROM: flexion and extension, to tolerance   Modalities  Date:  Vaso: Knee, 34 degrees; low pressure, 10 mins, Pain and Edema                                   10/16 EXERCISE LOG  Exercise Repetitions and Resistance Comments  Nustep L3 x 11 minutes; seat 14   Heel slide  25 reps   Quad set  25 reps w/ 5 second hold   Gastroc stretch 4 x 30 seconds        Blank cell = exercise not performed today  Modalities  Date:  Vaso: Knee, 34 degrees; low pressure, 15 mins, Pain and Edema   PATIENT EDUCATION:  Education details: POC, healing, prognosis, HEP Person educated: Patient Education method: Explanation Education comprehension: verbalized understanding   HOME EXERCISE PROGRAM: HEP provided by his surgeon was reviewed  ASSESSMENT:  CLINICAL IMPRESSION: Treatment focused on familiar interventions for improved knee mobility. He required minimal cueing with today's interventions for proper pacing to facilitate improved knee mobility. He reported no significant pain or discomfort with any of today's interventions. He reported that his knee was a little stiff upon the conclusion of treatment. Recommend that he continue with skilled physical therapy to address his remaining impairments to return to his prior level of function.   OBJECTIVE IMPAIRMENTS Abnormal gait, decreased activity tolerance, decreased endurance, decreased mobility, difficulty walking, decreased ROM, decreased strength, hypomobility, increased edema, impaired flexibility, and pain.  ACTIVITY LIMITATIONS carrying, lifting,  standing, squatting, sleeping, stairs, transfers, bed mobility, bathing, toileting, dressing, and locomotion level  PARTICIPATION LIMITATIONS: meal prep, cleaning, laundry, driving, shopping, and community activity  PERSONAL FACTORS 3+ comorbidities: HTN, DM, and OA  are also affecting patient's functional outcome.   REHAB POTENTIAL: Good  CLINICAL DECISION MAKING: Stable/uncomplicated  EVALUATION COMPLEXITY: Low   GOALS: Goals reviewed with patient? No  SHORT TERM GOALS: Target date: 07/01/2022  Patient will be independent with his initial HEP.  Baseline: Goal status: INITIAL  2.  Patient will be able to demonstrate active left knee extension within 10 degrees of neutral for improved gait mechanics.  Baseline:  Goal status: INITIAL  3.  Patient will be able to walk with a cane or the least restrictive assistive device for at least 80 feet.  Baseline:  Goal status: INITIAL  4.  Patient will be able to complete his daily activities without his familiar knee pain exceeding 6/10.  Baseline:  Goal status: INITIAL  LONG TERM GOALS: Target date: 07/22/2022   Patient will be independent with his advanced HEP.  Baseline:  Goal status: INITIAL  2.  Patient will be able to demonstrate active left knee extension within 5 degrees of neutral for improved gait mechanics.  Baseline:  Goal status: INITIAL  3.  Patient will be able to demonstrate at least 120 degree of active left knee flexion for improved function navigating stairs.  Baseline:  Goal status: INITIAL  4.  Patient will be able to navigate at least 4 steps with a reciprocal pattern for improved function with household navigation.  Baseline:  Goal status: INITIAL  5.  Patient will be able to complete his daily activities without his familiar pain exceeding 4/10.  Baseline:  Goal status: INITIAL  PLAN: PT FREQUENCY: 2-3x/week  PT DURATION: 6 weeks  PLANNED INTERVENTIONS: Therapeutic exercises, Therapeutic  activity, Neuromuscular re-education, Balance training, Gait training, Patient/Family education, Self Care, Joint mobilization, Stair training, Electrical stimulation, Cryotherapy, Moist heat, Vasopneumatic device, Manual therapy, and Re-evaluation  PLAN FOR NEXT SESSION: nustep, quad sets, heel slides, gastroc stretch, manual therapy, and modalities as needed   Darlin Coco, PT 06/20/2022, 3:40 PM

## 2022-06-22 ENCOUNTER — Ambulatory Visit: Payer: BC Managed Care – PPO

## 2022-06-22 DIAGNOSIS — M25662 Stiffness of left knee, not elsewhere classified: Secondary | ICD-10-CM | POA: Diagnosis not present

## 2022-06-22 DIAGNOSIS — M25562 Pain in left knee: Secondary | ICD-10-CM | POA: Diagnosis not present

## 2022-06-22 NOTE — Therapy (Signed)
OUTPATIENT PHYSICAL THERAPY LOWER EXTREMITY TREATMENT   Patient Name: Tony Cantu MRN: [id number] DOB:Apr 19, 1951, 71 y.o., male Today's Date: 06/22/2022   PT End of Session - 06/22/22 1445     Visit Number 6    Number of Visits 15    Date for PT Re-Evaluation 07/22/22    PT Start Time 3500    PT Stop Time 9381    PT Time Calculation (min) 47 min    Activity Tolerance Patient tolerated treatment well    Behavior During Therapy [facility] for tasks assessed/performed                 Past Medical History:  Diagnosis Date   Aortic atherosclerosis ([place])    Aortic stenosis    Arthritis    Asthma    BPH (benign prostatic hypertrophy)    Cirrhosis ([place]) 2016   Colon polyps    Diabetes mellitus without complication ([place])    TYPE 1    Diverticulosis    Dysrhythmia 04/19/2021   A-fib noted with possible A-fib RVR   Gastric ulcer    Gastric varices    right   Gastritis    GERD (gastroesophageal reflux disease)    Headache    History- pt states these were migraines that occurred in the 1980's   Heart murmur    History of kidney stones    Hx of adenomatous colonic polyps    Hypertension    PONV (postoperative nausea and vomiting)    pt states he has never had post op nausea or vomiting   Portal hypertensive gastropathy ([place])    Renal cyst, right    Seizures (H[facility])    HYPOGLYCEMIC LAST 1 AND 1/2 YRS AGO   Sleep apnea    uses CPAP nightly   Testicle trouble    one testicle BORN WITH   Tubular adenoma of colon    Past Surgical History:  Procedure Laterality Date   BACK SURGERY     89  LOWER    CARPAL TUNNEL RELEASE Right 10/13/2015   Procedure: RIGHT CARPAL TUNNEL RELEASE;  Surgeon: Daryll Brod, MD;  Location: [place];  Service: Orthopedics;  Laterality: Right;   CARPAL TUNNEL RELEASE Left 07/05/2016   Procedure: LEFT CARPAL TUNNEL RELEASE;  Surgeon: Daryll Brod, MD;  Location: [place];  Service: Orthopedics;  Laterality: Left;    DRUG INDUCED ENDOSCOPY N/A 07/02/2021   Procedure: DRUG INDUCED SLEEP ENDOSCOPY;  Surgeon: Melida Quitter, MD;  Location: [place];  Service: ENT;  Laterality: N/A;   IMPLANTATION OF HYPOGLOSSAL NERVE STIMULATOR Right 10/01/2021   Procedure: IMPLANTATION OF HYPOGLOSSAL NERVE STIMULATOR;  Surgeon: Melida Quitter, MD;  Location: [place];  Service: ENT;  Laterality: Right;   INGUINAL HERNIA REPAIR  2003   right    KNEE ARTHROSCOPY Right 03/03/2016   LUMBAR DISC SURGERY  03/1995   Dr. Coralyn Mark, discectomy   LUMBAR LAMINECTOMY/DECOMPRESSION MICRODISCECTOMY Right 10/06/2016   Procedure: RIGHT LUMBAR THREE - LUMBAR FOUR  LAMINECTOMY, FORAMINOTOMY AND MICRODISCECTOMY;  Surgeon: Jovita Gamma, MD;  Location: [place];  Service: Neurosurgery;  Laterality: Right;   SHOULDER SURGERY  11/28/2005   left partial   SHOULDER SURGERY  07/14/2006   RIGHT   TONSILLECTOMY  AGE 45 OR 4   TOTAL KNEE ARTHROPLASTY Right 03/07/2022   Procedure: RIGHT TOTAL KNEE ARTHROPLASTY;  Surgeon: Frederik Pear, MD;  Location: [facility];  Service: Orthopedics;  Laterality: Right;   TOTAL KNEE ARTHROPLASTY Left 06/06/2022   Procedure: LEFT  TOTAL KNEE ARTHROPLASTY;  Surgeon: Frederik Pear, MD;  Location: [facility];  Service: Orthopedics;  Laterality: Left;   TOTAL SHOULDER ARTHROPLASTY Right 11/01/2018   Procedure: RIGHT SHOULDER REVISION TO REVERSE TOTAL SHOULDER;  Surgeon: Tania Ade, MD;  Location: [facility];  Service: Orthopedics;  Laterality: Right;  CHOICE ANESTHESIA WITH INTERSCALENE BLOCK EXPAREL, NEEDS RNFA   Patient Active Problem List   Diagnosis Date Noted   S/P total knee arthroplasty, left 06/06/2022   Degenerative arthritis of left knee 06/03/2022   Chronic obstructive pulmonary disease ([place]) 05/10/2022   Grade I diastolic dysfunction 50/38/8828   Postoperative fever 03/10/2022   S/P TKR (total knee replacement), right 03/07/2022   Osteoarthritis of right knee 03/04/2022   Precordial chest pain 04/20/2021   Former smoker  01/12/2021   PAF (paroxysmal atrial fibrillation) ([place]) 06/22/2020   Hypertension associated with type 2 diabetes mellitus ([place]) 05/17/2019   Hyperlipidemia associated with type 2 diabetes mellitus ([place]) 05/17/2019   GERD with esophagitis 05/17/2019   H/O total shoulder replacement, right 11/01/2018   Paroxysmal tachycardia ([place]) 01/11/2017   Nonrheumatic aortic valve stenosis 01/11/2017   Aortic atherosclerosis ([place]) 11/23/2016   HNP (herniated nucleus pulposus), lumbar 10/06/2016   Acute renal injury ([place]) 08/06/2016   Diarrhea 08/06/2016   Fever 08/06/2016   Hyperbilirubinemia 08/06/2016   Lactic acidosis 08/06/2016   Inguinal hernia 12/10/2015   Right groin pain 11/30/2015   Carpal tunnel syndrome on right 09/09/2015   Cervical spondylosis without myelopathy 09/09/2015   Thrombocytopenia ([place]) 07/21/2014   Bilateral carotid bruits 05/11/2014   Vitamin D deficiency 09/17/2013   BPH (benign prostatic hyperplasia) 05/22/2013   Low serum testosterone level 02/18/2013   Diabetes type 2, controlled ([place]) 01/10/2013   OSA (obstructive sleep apnea) 05/16/2012   Edema 02/21/2012   At risk for coronary artery disease 03/20/2011   Obesity 03/20/2011   Allergic rhinitis 06/22/2010   ASTHMA 06/22/2010   COUGH 06/22/2010    PCP: [name], DO  REFERRING PROVIDER: Frederik Pear, MD  REFERRING DIAG: s/p Left TKA   THERAPY DIAG:  Acute pain of left knee  Stiffness of left knee, not elsewhere classified  Rationale for Evaluation and Treatment Rehabilitation  ONSET DATE: 06/06/22  SUBJECTIVE:   SUBJECTIVE STATEMENT: Patient reports that his knee feels alright today. He has been doing more walking recently.   PERTINENT HISTORY: HTN, DM, and OA  PAIN:  Are you having pain? Yes: NPRS scale: 2/10 Pain location: left knee Pain description: aching Aggravating factors: moving his knee and walking Relieving factors: medication and ice  PRECAUTIONS: None  WEIGHT  BEARING RESTRICTIONS No  FALLS:  Has patient fallen in last 6 months? No  LIVING ENVIRONMENT: Lives with: lives with their spouse Lives in: House/apartment Stairs: Yes: Internal: 20-25 steps; can reach both and External: 2 steps; can reach both Has following equipment at home: Gilford Rile - 2 wheeled  OCCUPATION: retired  PLOF: Independent  PATIENT GOALS: walking without assistive device, go outside an take pictures   OBJECTIVE: all objective measures were performed at his initial evaluation on 06/10/22 unless otherwise noted  PATIENT SURVEYS:  FOTO 16.73  COGNITION:  Overall cognitive status: Within functional limits for tasks assessed     SENSATION: Patient reports no numbness and tingling  EDEMA:  Moderate LLE edema (wearing compression stockings); no signs or symptoms of DVT  PALPATION: TTP: left patella and patellar tendon  LOWER EXTREMITY ROM:  Active ROM Right eval Left eval  Hip flexion    Hip  extension    Hip abduction    Hip adduction    Hip internal rotation    Hip external rotation    Knee flexion 136 86/90 (PROM)  Knee extension 0 16  Ankle dorsiflexion    Ankle plantarflexion    Ankle inversion    Ankle eversion     (Blank rows = not tested)  LOWER EXTREMITY MMT: not tested due to surgical condition  FUNCTIONAL TESTS:  Required therapist assistance (modA) for sit to supine and sit to stand transfer   GAIT: Assistive device utilized: Environmental consultant - 2 wheeled Level of assistance: Modified independence Comments: step to pattern, poor foot clearance bilaterally, left knee flexed in stance, minimal left knee flexion in swing phase  TODAY'S TREATMENT:                                   10/25 EXERCISE LOG  Exercise Repetitions and Resistance Comments  Nustep L5 x 17 minutes; seat 10   SLR  25 reps    Prolonged knee extension with zero knee 3 minutes             Blank cell = exercise not performed today  Manual Therapy Soft Tissue Mobilization:  left hamstring and quadriceps, for improved soft tissue extensibility Joint Mobilizations: patellar, grade I-IV Passive ROM: flexion and extension, to tolerance    Modalities  Date:  [place]: Knee, 34 degrees; low pressure, 15 mins, Pain and Edema                                   10/23 EXERCISE LOG  Exercise Repetitions and Resistance Comments  Nustep  L4 x 20 minutes; seat 11-9    Gastroc stretch  2 x 30 seconds   LAQ 30 reps            Blank cell = exercise not performed today  Modalities  Date:  [place]: Knee, 34 degrees; low pressure, 10 mins, Pain and Edema                                   10/20 EXERCISE LOG  Exercise Repetitions and Resistance Comments  Nustep L3 x 15 minutes; seat 12-11   Gastroc stretch     Lunges onto step     LAQ's X10 hold 5 secs   HS curls 2x10   Heel ups/Toe Ups X10 each    Blank cell = exercise not performed today  Manual Therapy Soft Tissue Mobilization: gentle scar mobs and patella mobs,    Passive ROM: flexion and extension, to tolerance   Modalities  Date:  [place]: Knee, 34 degrees; low pressure, 10 mins, Pain and Edema  PATIENT EDUCATION:  Education details: POC, healing, prognosis, HEP Person educated: Patient Education method: Explanation Education comprehension: verbalized understanding   HOME EXERCISE PROGRAM: HEP provided by his surgeon was reviewed  ASSESSMENT:  CLINICAL IMPRESSION: Treatment focused on manual therapy for improved knee mobility. Soft tissue mobilization to the hamstrings followed by patellar joint mobilizations and PROM was the most effective at improving his mobility as he was able to demonstrate 102 degrees AROM and 103 degrees PROM after manual therapy. He reported feeling alright upon the conclusion of treatment. He continues to require skilled physical therapy to address his remaining impairments to return  to his prior level of function.   OBJECTIVE IMPAIRMENTS Abnormal gait, decreased activity tolerance,  decreased endurance, decreased mobility, difficulty walking, decreased ROM, decreased strength, hypomobility, increased edema, impaired flexibility, and pain.   ACTIVITY LIMITATIONS carrying, lifting, standing, squatting, sleeping, stairs, transfers, bed mobility, bathing, toileting, dressing, and locomotion level  PARTICIPATION LIMITATIONS: meal prep, cleaning, laundry, driving, shopping, and community activity  PERSONAL FACTORS 3+ comorbidities: HTN, DM, and OA  are also affecting patient's functional outcome.   REHAB POTENTIAL: Good  CLINICAL DECISION MAKING: Stable/uncomplicated  EVALUATION COMPLEXITY: Low   GOALS: Goals reviewed with patient? No  SHORT TERM GOALS: Target date: 07/01/2022  Patient will be independent with his initial HEP.  Baseline: Goal status: INITIAL  2.  Patient will be able to demonstrate active left knee extension within 10 degrees of neutral for improved gait mechanics.  Baseline:  Goal status: INITIAL  3.  Patient will be able to walk with a cane or the least restrictive assistive device for at least 80 feet.  Baseline:  Goal status: INITIAL  4.  Patient will be able to complete his daily activities without his familiar knee pain exceeding 6/10.  Baseline:  Goal status: INITIAL  LONG TERM GOALS: Target date: 07/22/2022   Patient will be independent with his advanced HEP.  Baseline:  Goal status: INITIAL  2.  Patient will be able to demonstrate active left knee extension within 5 degrees of neutral for improved gait mechanics.  Baseline:  Goal status: INITIAL  3.  Patient will be able to demonstrate at least 120 degree of active left knee flexion for improved function navigating stairs.  Baseline:  Goal status: INITIAL  4.  Patient will be able to navigate at least 4 steps with a reciprocal pattern for improved function with household navigation.  Baseline:  Goal status: INITIAL  5.  Patient will be able to complete his daily activities  without his familiar pain exceeding 4/10.  Baseline:  Goal status: INITIAL  PLAN: PT FREQUENCY: 2-3x/week  PT DURATION: 6 weeks  PLANNED INTERVENTIONS: Therapeutic exercises, Therapeutic activity, Neuromuscular re-education, Balance training, Gait training, Patient/Family education, Self Care, Joint mobilization, Stair training, Electrical stimulation, Cryotherapy, Moist heat, Vasopneumatic device, Manual therapy, and Re-evaluation  PLAN FOR NEXT SESSION: nustep, quad sets, heel slides, gastroc stretch, manual therapy, and modalities as needed   Darlin Coco, PT 06/22/2022, 2:51 PM

## 2022-06-24 ENCOUNTER — Ambulatory Visit: Payer: BC Managed Care – PPO

## 2022-06-24 DIAGNOSIS — M25662 Stiffness of left knee, not elsewhere classified: Secondary | ICD-10-CM | POA: Diagnosis not present

## 2022-06-24 DIAGNOSIS — M25562 Pain in left knee: Secondary | ICD-10-CM | POA: Diagnosis not present

## 2022-06-24 NOTE — Therapy (Signed)
OUTPATIENT PHYSICAL THERAPY LOWER EXTREMITY TREATMENT   Patient Name: Tony Cantu MRN: [id number] DOB:1951-02-27, 71 y.o., male Today's Date: 06/24/2022   PT End of Session - 06/24/22 1127     Visit Number 7    Number of Visits 15    Date for PT Re-Evaluation 07/22/22    PT Start Time 1115    PT Stop Time 1200    PT Time Calculation (min) 45 min    Activity Tolerance Patient tolerated treatment well    Behavior During Therapy [facility] for tasks assessed/performed                  Past Medical History:  Diagnosis Date   Aortic atherosclerosis ([place])    Aortic stenosis    Arthritis    Asthma    BPH (benign prostatic hypertrophy)    Cirrhosis ([place]) 2016   Colon polyps    Diabetes mellitus without complication ([place])    TYPE 1    Diverticulosis    Dysrhythmia 04/19/2021   A-fib noted with possible A-fib RVR   Gastric ulcer    Gastric varices    right   Gastritis    GERD (gastroesophageal reflux disease)    Headache    History- pt states these were migraines that occurred in the 1980's   Heart murmur    History of kidney stones    Hx of adenomatous colonic polyps    Hypertension    PONV (postoperative nausea and vomiting)    pt states he has never had post op nausea or vomiting   Portal hypertensive gastropathy ([place])    Renal cyst, right    Seizures ([facility])    HYPOGLYCEMIC LAST 1 AND 1/2 YRS AGO   Sleep apnea    uses CPAP nightly   Testicle trouble    one testicle BORN WITH   Tubular adenoma of colon    Past Surgical History:  Procedure Laterality Date   BACK SURGERY     94  LOWER    CARPAL TUNNEL RELEASE Right 10/13/2015   Procedure: RIGHT CARPAL TUNNEL RELEASE;  Surgeon: Daryll Brod, MD;  Location: [place];  Service: Orthopedics;  Laterality: Right;   CARPAL TUNNEL RELEASE Left 07/05/2016   Procedure: LEFT CARPAL TUNNEL RELEASE;  Surgeon: Daryll Brod, MD;  Location: [place];  Service: Orthopedics;  Laterality: Left;    DRUG INDUCED ENDOSCOPY N/A 07/02/2021   Procedure: DRUG INDUCED SLEEP ENDOSCOPY;  Surgeon: Melida Quitter, MD;  Location: [place];  Service: ENT;  Laterality: N/A;   IMPLANTATION OF HYPOGLOSSAL NERVE STIMULATOR Right 10/01/2021   Procedure: IMPLANTATION OF HYPOGLOSSAL NERVE STIMULATOR;  Surgeon: Melida Quitter, MD;  Location: [place];  Service: ENT;  Laterality: Right;   INGUINAL HERNIA REPAIR  2003   right    KNEE ARTHROSCOPY Right 03/03/2016   LUMBAR DISC SURGERY  03/1995   Dr. Coralyn Mark, discectomy   LUMBAR LAMINECTOMY/DECOMPRESSION MICRODISCECTOMY Right 10/06/2016   Procedure: RIGHT LUMBAR THREE - LUMBAR FOUR  LAMINECTOMY, FORAMINOTOMY AND MICRODISCECTOMY;  Surgeon: Jovita Gamma, MD;  Location: [place];  Service: Neurosurgery;  Laterality: Right;   SHOULDER SURGERY  11/28/2005   left partial   SHOULDER SURGERY  07/14/2006   RIGHT   TONSILLECTOMY  AGE 75 OR 4   TOTAL KNEE ARTHROPLASTY Right 03/07/2022   Procedure: RIGHT TOTAL KNEE ARTHROPLASTY;  Surgeon: Frederik Pear, MD;  Location: [facility];  Service: Orthopedics;  Laterality: Right;   TOTAL KNEE ARTHROPLASTY Left 06/06/2022   Procedure:  LEFT TOTAL KNEE ARTHROPLASTY;  Surgeon: Frederik Pear, MD;  Location: [facility];  Service: Orthopedics;  Laterality: Left;   TOTAL SHOULDER ARTHROPLASTY Right 11/01/2018   Procedure: RIGHT SHOULDER REVISION TO REVERSE TOTAL SHOULDER;  Surgeon: Tania Ade, MD;  Location: [facility];  Service: Orthopedics;  Laterality: Right;  CHOICE ANESTHESIA WITH INTERSCALENE BLOCK EXPAREL, NEEDS RNFA   Patient Active Problem List   Diagnosis Date Noted   S/P total knee arthroplasty, left 06/06/2022   Degenerative arthritis of left knee 06/03/2022   Chronic obstructive pulmonary disease ([place]) 05/10/2022   Grade I diastolic dysfunction 53/66/4403   Postoperative fever 03/10/2022   S/P TKR (total knee replacement), right 03/07/2022   Osteoarthritis of right knee 03/04/2022   Precordial chest pain 04/20/2021   Former smoker  01/12/2021   PAF (paroxysmal atrial fibrillation) ([place]) 06/22/2020   Hypertension associated with type 2 diabetes mellitus ([place]) 05/17/2019   Hyperlipidemia associated with type 2 diabetes mellitus ([place]) 05/17/2019   GERD with esophagitis 05/17/2019   H/O total shoulder replacement, right 11/01/2018   Paroxysmal tachycardia ([place]) 01/11/2017   Nonrheumatic aortic valve stenosis 01/11/2017   Aortic atherosclerosis ([place]) 11/23/2016   HNP (herniated nucleus pulposus), lumbar 10/06/2016   Acute renal injury ([place]) 08/06/2016   Diarrhea 08/06/2016   Fever 08/06/2016   Hyperbilirubinemia 08/06/2016   Lactic acidosis 08/06/2016   Inguinal hernia 12/10/2015   Right groin pain 11/30/2015   Carpal tunnel syndrome on right 09/09/2015   Cervical spondylosis without myelopathy 09/09/2015   Thrombocytopenia ([place]) 07/21/2014   Bilateral carotid bruits 05/11/2014   Vitamin D deficiency 09/17/2013   BPH (benign prostatic hyperplasia) 05/22/2013   Low serum testosterone level 02/18/2013   Diabetes type 2, controlled ([place]) 01/10/2013   OSA (obstructive sleep apnea) 05/16/2012   Edema 02/21/2012   At risk for coronary artery disease 03/20/2011   Obesity 03/20/2011   Allergic rhinitis 06/22/2010   ASTHMA 06/22/2010   COUGH 06/22/2010    PCP: [name], DO  REFERRING PROVIDER: Frederik Pear, MD  REFERRING DIAG: s/p Left TKA   THERAPY DIAG:  Acute pain of left knee  Stiffness of left knee, not elsewhere classified  Rationale for Evaluation and Treatment Rehabilitation  ONSET DATE: 06/06/22  SUBJECTIVE:   SUBJECTIVE STATEMENT: Patient reports that his knee is a little sore today.   PERTINENT HISTORY: HTN, DM, and OA  PAIN:  Are you having pain? Yes: NPRS scale: 2/10 Pain location: left knee Pain description: aching Aggravating factors: moving his knee and walking Relieving factors: medication and ice  PRECAUTIONS: None  WEIGHT BEARING RESTRICTIONS No  FALLS:  Has  patient fallen in last 6 months? No  LIVING ENVIRONMENT: Lives with: lives with their spouse Lives in: House/apartment Stairs: Yes: Internal: 20-25 steps; can reach both and External: 2 steps; can reach both Has following equipment at home: Gilford Rile - 2 wheeled  OCCUPATION: retired  PLOF: Independent  PATIENT GOALS: walking without assistive device, go outside an take pictures   OBJECTIVE: all objective measures were performed at his initial evaluation on 06/10/22 unless otherwise noted  PATIENT SURVEYS:  FOTO 16.73  COGNITION:  Overall cognitive status: Within functional limits for tasks assessed     SENSATION: Patient reports no numbness and tingling  EDEMA:  Moderate LLE edema (wearing compression stockings); no signs or symptoms of DVT  PALPATION: TTP: left patella and patellar tendon  LOWER EXTREMITY ROM:  Active ROM Right eval Left eval  Hip flexion    Hip extension  Hip abduction    Hip adduction    Hip internal rotation    Hip external rotation    Knee flexion 136 86/90 (PROM)  Knee extension 0 16  Ankle dorsiflexion    Ankle plantarflexion    Ankle inversion    Ankle eversion     (Blank rows = not tested)  LOWER EXTREMITY MMT: not tested due to surgical condition  FUNCTIONAL TESTS:  Required therapist assistance (modA) for sit to supine and sit to stand transfer   GAIT: Assistive device utilized: Environmental consultant - 2 wheeled Level of assistance: Modified independence Comments: step to pattern, poor foot clearance bilaterally, left knee flexed in stance, minimal left knee flexion in swing phase  TODAY'S TREATMENT:                                   10/27 EXERCISE LOG  Exercise Repetitions and Resistance Comments  Nustep  L4 x 15 minutes; seat 11-10   Lunges onto step  14" step x 2 minutes   Gastroc stretch  4 x 30 seconds   Side stepping on foam 2 minutes   Sit to stand  20 reps  From    Blank cell = exercise not performed today   Modalities  Date:  [place]: Knee, 34 degrees; low pressure, 10 mins, Pain and Edema                                   10/25 EXERCISE LOG  Exercise Repetitions and Resistance Comments  Nustep L5 x 17 minutes; seat 10   SLR  25 reps    Prolonged knee extension with zero knee 3 minutes             Blank cell = exercise not performed today  Manual Therapy Soft Tissue Mobilization: left hamstring and quadriceps, for improved soft tissue extensibility Joint Mobilizations: patellar, grade I-IV Passive ROM: flexion and extension, to tolerance    Modalities  Date:  [place]: Knee, 34 degrees; low pressure, 15 mins, Pain and Edema                                   10/23 EXERCISE LOG  Exercise Repetitions and Resistance Comments  Nustep  L4 x 20 minutes; seat 11-9    Gastroc stretch  2 x 30 seconds   LAQ 30 reps            Blank cell = exercise not performed today  Modalities  Date:  [place]: Knee, 34 degrees; low pressure, 10 mins, Pain and Edema   PATIENT EDUCATION:  Education details: POC, healing, prognosis, HEP Person educated: Patient Education method: Explanation Education comprehension: verbalized understanding   HOME EXERCISE PROGRAM: HEP provided by his surgeon was reviewed  ASSESSMENT:  CLINICAL IMPRESSION: Patient was introduced to multiple new and familiar interventions for improved knee mobility and strength needed for improved functional mobility. He required minimal cueing with sit to stands to facilitate increased lower extremity demand. He experienced no significant pain or discomfort with any of today's interventions. He reported feeling about the same upon the conclusion of treatment. He continues to require skilled physical therapy to address his remaining impairments to return to his prior level of function.   OBJECTIVE IMPAIRMENTS Abnormal gait, decreased activity tolerance, decreased endurance,  decreased mobility, difficulty walking, decreased ROM, decreased  strength, hypomobility, increased edema, impaired flexibility, and pain.   ACTIVITY LIMITATIONS carrying, lifting, standing, squatting, sleeping, stairs, transfers, bed mobility, bathing, toileting, dressing, and locomotion level  PARTICIPATION LIMITATIONS: meal prep, cleaning, laundry, driving, shopping, and community activity  PERSONAL FACTORS 3+ comorbidities: HTN, DM, and OA  are also affecting patient's functional outcome.   REHAB POTENTIAL: Good  CLINICAL DECISION MAKING: Stable/uncomplicated  EVALUATION COMPLEXITY: Low   GOALS: Goals reviewed with patient? No  SHORT TERM GOALS: Target date: 07/01/2022  Patient will be independent with his initial HEP.  Baseline: Goal status: INITIAL  2.  Patient will be able to demonstrate active left knee extension within 10 degrees of neutral for improved gait mechanics.  Baseline:  Goal status: INITIAL  3.  Patient will be able to walk with a cane or the least restrictive assistive device for at least 80 feet.  Baseline:  Goal status: INITIAL  4.  Patient will be able to complete his daily activities without his familiar knee pain exceeding 6/10.  Baseline:  Goal status: INITIAL  LONG TERM GOALS: Target date: 07/22/2022   Patient will be independent with his advanced HEP.  Baseline:  Goal status: INITIAL  2.  Patient will be able to demonstrate active left knee extension within 5 degrees of neutral for improved gait mechanics.  Baseline:  Goal status: INITIAL  3.  Patient will be able to demonstrate at least 120 degree of active left knee flexion for improved function navigating stairs.  Baseline:  Goal status: INITIAL  4.  Patient will be able to navigate at least 4 steps with a reciprocal pattern for improved function with household navigation.  Baseline:  Goal status: INITIAL  5.  Patient will be able to complete his daily activities without his familiar pain exceeding 4/10.  Baseline:  Goal status:  INITIAL  PLAN: PT FREQUENCY: 2-3x/week  PT DURATION: 6 weeks  PLANNED INTERVENTIONS: Therapeutic exercises, Therapeutic activity, Neuromuscular re-education, Balance training, Gait training, Patient/Family education, Self Care, Joint mobilization, Stair training, Electrical stimulation, Cryotherapy, Moist heat, Vasopneumatic device, Manual therapy, and Re-evaluation  PLAN FOR NEXT SESSION: nustep, quad sets, heel slides, gastroc stretch, manual therapy, and modalities as needed   Darlin Coco, PT 06/24/2022, 12:20 PM

## 2022-06-27 ENCOUNTER — Ambulatory Visit: Payer: BC Managed Care – PPO

## 2022-06-27 DIAGNOSIS — M25562 Pain in left knee: Secondary | ICD-10-CM

## 2022-06-27 DIAGNOSIS — M25662 Stiffness of left knee, not elsewhere classified: Secondary | ICD-10-CM

## 2022-06-27 NOTE — Therapy (Signed)
OUTPATIENT PHYSICAL THERAPY LOWER EXTREMITY TREATMENT   Patient Name: Tony Cantu MRN: [id number] DOB:1951-08-17, 71 y.o., male Today's Date: 06/27/2022   PT End of Session - 06/27/22 0910     Visit Number 8    Number of Visits 15    Date for PT Re-Evaluation 07/22/22    PT Start Time 0900    PT Stop Time 1000    PT Time Calculation (min) 60 min    Activity Tolerance Patient tolerated treatment well    Behavior During Therapy [facility] for tasks assessed/performed                   Past Medical History:  Diagnosis Date   Aortic atherosclerosis ([place])    Aortic stenosis    Arthritis    Asthma    BPH (benign prostatic hypertrophy)    Cirrhosis ([place]) 2016   Colon polyps    Diabetes mellitus without complication ([place])    TYPE 1    Diverticulosis    Dysrhythmia 04/19/2021   A-fib noted with possible A-fib RVR   Gastric ulcer    Gastric varices    right   Gastritis    GERD (gastroesophageal reflux disease)    Headache    History- pt states these were migraines that occurred in the 1980's   Heart murmur    History of kidney stones    Hx of adenomatous colonic polyps    Hypertension    PONV (postoperative nausea and vomiting)    pt states he has never had post op nausea or vomiting   Portal hypertensive gastropathy (HCC)    Renal cyst, right    Seizures (HCC)    HYPOGLYCEMIC LAST 1 AND 1/2 YRS AGO   Sleep apnea    uses CPAP nightly   Testicle trouble    one testicle BORN WITH   Tubular adenoma of colon    Past Surgical History:  Procedure Laterality Date   BACK SURGERY     94  LOWER    CARPAL TUNNEL RELEASE Right 10/13/2015   Procedure: RIGHT CARPAL TUNNEL RELEASE;  Surgeon: Daryll Brod, MD;  Location: [place];  Service: Orthopedics;  Laterality: Right;   CARPAL TUNNEL RELEASE Left 07/05/2016   Procedure: LEFT CARPAL TUNNEL RELEASE;  Surgeon: Daryll Brod, MD;  Location: [place];  Service: Orthopedics;  Laterality: Left;    DRUG INDUCED ENDOSCOPY N/A 07/02/2021   Procedure: DRUG INDUCED SLEEP ENDOSCOPY;  Surgeon: Melida Quitter, MD;  Location: [place];  Service: ENT;  Laterality: N/A;   IMPLANTATION OF HYPOGLOSSAL NERVE STIMULATOR Right 10/01/2021   Procedure: IMPLANTATION OF HYPOGLOSSAL NERVE STIMULATOR;  Surgeon: Melida Quitter, MD;  Location: [place];  Service: ENT;  Laterality: Right;   INGUINAL HERNIA REPAIR  2003   right    KNEE ARTHROSCOPY Right 03/03/2016   LUMBAR DISC SURGERY  03/1995   Dr. Coralyn Mark, discectomy   LUMBAR LAMINECTOMY/DECOMPRESSION MICRODISCECTOMY Right 10/06/2016   Procedure: RIGHT LUMBAR THREE - LUMBAR FOUR  LAMINECTOMY, FORAMINOTOMY AND MICRODISCECTOMY;  Surgeon: Jovita Gamma, MD;  Location: [place];  Service: Neurosurgery;  Laterality: Right;   SHOULDER SURGERY  11/28/2005   left partial   SHOULDER SURGERY  07/14/2006   RIGHT   TONSILLECTOMY  AGE 80 OR 4   TOTAL KNEE ARTHROPLASTY Right 03/07/2022   Procedure: RIGHT TOTAL KNEE ARTHROPLASTY;  Surgeon: Frederik Pear, MD;  Location: [facility];  Service: Orthopedics;  Laterality: Right;   TOTAL KNEE ARTHROPLASTY Left 06/06/2022  Procedure: LEFT TOTAL KNEE ARTHROPLASTY;  Surgeon: Frederik Pear, MD;  Location: [facility];  Service: Orthopedics;  Laterality: Left;   TOTAL SHOULDER ARTHROPLASTY Right 11/01/2018   Procedure: RIGHT SHOULDER REVISION TO REVERSE TOTAL SHOULDER;  Surgeon: Tania Ade, MD;  Location: [facility];  Service: Orthopedics;  Laterality: Right;  CHOICE ANESTHESIA WITH INTERSCALENE BLOCK EXPAREL, NEEDS RNFA   Patient Active Problem List   Diagnosis Date Noted   S/P total knee arthroplasty, left 06/06/2022   Degenerative arthritis of left knee 06/03/2022   Chronic obstructive pulmonary disease ([place]) 05/10/2022   Grade I diastolic dysfunction 16/05/9603   Postoperative fever 03/10/2022   S/P TKR (total knee replacement), right 03/07/2022   Osteoarthritis of right knee 03/04/2022   Precordial chest pain 04/20/2021   Former smoker  01/12/2021   PAF (paroxysmal atrial fibrillation) ([place]) 06/22/2020   Hypertension associated with type 2 diabetes mellitus ([place]) 05/17/2019   Hyperlipidemia associated with type 2 diabetes mellitus ([place]) 05/17/2019   GERD with esophagitis 05/17/2019   H/O total shoulder replacement, right 11/01/2018   Paroxysmal tachycardia ([place]) 01/11/2017   Nonrheumatic aortic valve stenosis 01/11/2017   Aortic atherosclerosis ([place]) 11/23/2016   HNP (herniated nucleus pulposus), lumbar 10/06/2016   Acute renal injury ([place]) 08/06/2016   Diarrhea 08/06/2016   Fever 08/06/2016   Hyperbilirubinemia 08/06/2016   Lactic acidosis 08/06/2016   Inguinal hernia 12/10/2015   Right groin pain 11/30/2015   Carpal tunnel syndrome on right 09/09/2015   Cervical spondylosis without myelopathy 09/09/2015   Thrombocytopenia ([place]) 07/21/2014   Bilateral carotid bruits 05/11/2014   Vitamin D deficiency 09/17/2013   BPH (benign prostatic hyperplasia) 05/22/2013   Low serum testosterone level 02/18/2013   Diabetes type 2, controlled ([place]) 01/10/2013   OSA (obstructive sleep apnea) 05/16/2012   Edema 02/21/2012   At risk for coronary artery disease 03/20/2011   Obesity 03/20/2011   Allergic rhinitis 06/22/2010   ASTHMA 06/22/2010   COUGH 06/22/2010    PCP: [name], DO  REFERRING PROVIDER: Frederik Pear, MD  REFERRING DIAG: s/p Left TKA   THERAPY DIAG:  Acute pain of left knee  Stiffness of left knee, not elsewhere classified  Rationale for Evaluation and Treatment Rehabilitation  ONSET DATE: 06/06/22  SUBJECTIVE:   SUBJECTIVE STATEMENT: Patient reports that his knee isn't bothering him too bad. However, it is primarily aggravating.   PERTINENT HISTORY: HTN, DM, and OA  PAIN:  Are you having pain? Yes: NPRS scale: 2/10 Pain location: left knee Pain description: aching Aggravating factors: moving his knee and walking Relieving factors: medication and ice  PRECAUTIONS: None  WEIGHT  BEARING RESTRICTIONS No  FALLS:  Has patient fallen in last 6 months? No  LIVING ENVIRONMENT: Lives with: lives with their spouse Lives in: House/apartment Stairs: Yes: Internal: 20-25 steps; can reach both and External: 2 steps; can reach both Has following equipment at home: Gilford Rile - 2 wheeled  OCCUPATION: retired  PLOF: Independent  PATIENT GOALS: walking without assistive device, go outside an take pictures   OBJECTIVE: all objective measures were performed at his initial evaluation on 06/10/22 unless otherwise noted  PATIENT SURVEYS:  FOTO 16.73  COGNITION:  Overall cognitive status: Within functional limits for tasks assessed     SENSATION: Patient reports no numbness and tingling  EDEMA:  Moderate LLE edema (wearing compression stockings); no signs or symptoms of DVT  PALPATION: TTP: left patella and patellar tendon  LOWER EXTREMITY ROM:  Active ROM Right eval Left eval  Hip flexion  Hip extension    Hip abduction    Hip adduction    Hip internal rotation    Hip external rotation    Knee flexion 136 86/90 (PROM)  Knee extension 0 16  Ankle dorsiflexion    Ankle plantarflexion    Ankle inversion    Ankle eversion     (Blank rows = not tested)  LOWER EXTREMITY MMT: not tested due to surgical condition  FUNCTIONAL TESTS:  Required therapist assistance (modA) for sit to supine and sit to stand transfer   GAIT: Assistive device utilized: Environmental consultant - 2 wheeled Level of assistance: Modified independence Comments: step to pattern, poor foot clearance bilaterally, left knee flexed in stance, minimal left knee flexion in swing phase  TODAY'S TREATMENT:                                   10/30 EXERCISE LOG  Exercise Repetitions and Resistance Comments  Nustep  L4 x 15 minutes; seat 10-9   Rocker board  5 minutes    Lunges onto step 2 minutes    Sit to stand  30 reps  From elevated table  Step up 6" step x 3 minutes Alternating LE  LAQ 30 reps     Blank cell = exercise not performed today  Modalities  Date:  [place]: Knee, 34 degrees; low pressure, 15 mins, Pain and Edema                                   10/27 EXERCISE LOG  Exercise Repetitions and Resistance Comments  Nustep  L4 x 15 minutes; seat 11-10   Lunges onto step  14" step x 2 minutes   Gastroc stretch  4 x 30 seconds   Side stepping on foam 2 minutes   Sit to stand  20 reps  From    Blank cell = exercise not performed today  Modalities  Date:  [place]aso: Knee, 34 degrees; low pressure, 10 mins, Pain and Edema                                   10/25 EXERCISE LOG  Exercise Repetitions and Resistance Comments  Nustep L5 x 17 minutes; seat 10   SLR  25 reps    Prolonged knee extension with zero knee 3 minutes             Blank cell = exercise not performed today  Manual Therapy Soft Tissue Mobilization: left hamstring and quadriceps, for improved soft tissue extensibility Joint Mobilizations: patellar, grade I-IV Passive ROM: flexion and extension, to tolerance    Modalities  Date:  Vas[place]: Knee, 34 degrees; low pressure, 15 mins, Pain and Edema  PATIENT EDUCATION:  Education details: POC, healing, prognosis, HEP Person educated: Patient Education method: Explanation Education comprehension: verbalized understanding   HOME EXERCISE PROGRAM: HEP provided by his surgeon was reviewed  ASSESSMENT:  CLINICAL IMPRESSION: Patient was progressed with step ups and other familiar interventions for improved knee mobility and strength with moderate difficulty. He required minimal cueing with long arc quads to prevent lumbar extension to isolate quadriceps engagement. Fatigue was his primary limitation with today's interventions as he required occasional seated rest breaks throughout treatment. He reported that his knee felt "like I've done something" upon the conclusion  of treatment. He continues to require skilled physical therapy to address his remaining impairments to  return to his prior level of function.   OBJECTIVE IMPAIRMENTS Abnormal gait, decreased activity tolerance, decreased endurance, decreased mobility, difficulty walking, decreased ROM, decreased strength, hypomobility, increased edema, impaired flexibility, and pain.   ACTIVITY LIMITATIONS carrying, lifting, standing, squatting, sleeping, stairs, transfers, bed mobility, bathing, toileting, dressing, and locomotion level  PARTICIPATION LIMITATIONS: meal prep, cleaning, laundry, driving, shopping, and community activity  PERSONAL FACTORS 3+ comorbidities: HTN, DM, and OA  are also affecting patient's functional outcome.   REHAB POTENTIAL: Good  CLINICAL DECISION MAKING: Stable/uncomplicated  EVALUATION COMPLEXITY: Low   GOALS: Goals reviewed with patient? No  SHORT TERM GOALS: Target date: 07/01/2022  Patient will be independent with his initial HEP.  Baseline: Goal status: INITIAL  2.  Patient will be able to demonstrate active left knee extension within 10 degrees of neutral for improved gait mechanics.  Baseline:  Goal status: INITIAL  3.  Patient will be able to walk with a cane or the least restrictive assistive device for at least 80 feet.  Baseline:  Goal status: INITIAL  4.  Patient will be able to complete his daily activities without his familiar knee pain exceeding 6/10.  Baseline:  Goal status: INITIAL  LONG TERM GOALS: Target date: 07/22/2022   Patient will be independent with his advanced HEP.  Baseline:  Goal status: INITIAL  2.  Patient will be able to demonstrate active left knee extension within 5 degrees of neutral for improved gait mechanics.  Baseline:  Goal status: INITIAL  3.  Patient will be able to demonstrate at least 120 degree of active left knee flexion for improved function navigating stairs.  Baseline:  Goal status: INITIAL  4.  Patient will be able to navigate at least 4 steps with a reciprocal pattern for improved function with  household navigation.  Baseline:  Goal status: INITIAL  5.  Patient will be able to complete his daily activities without his familiar pain exceeding 4/10.  Baseline:  Goal status: INITIAL  PLAN: PT FREQUENCY: 2-3x/week  PT DURATION: 6 weeks  PLANNED INTERVENTIONS: Therapeutic exercises, Therapeutic activity, Neuromuscular re-education, Balance training, Gait training, Patient/Family education, Self Care, Joint mobilization, Stair training, Electrical stimulation, Cryotherapy, Moist heat, Vasopneumatic device, Manual therapy, and Re-evaluation  PLAN FOR NEXT SESSION: nustep, quad sets, heel slides, gastroc stretch, manual therapy, and modalities as needed   Darlin Coco, PT 06/27/2022, 12:57 PM

## 2022-06-28 ENCOUNTER — Encounter: Payer: Self-pay | Admitting: Family Medicine

## 2022-06-29 ENCOUNTER — Other Ambulatory Visit: Payer: Self-pay | Admitting: Family Medicine

## 2022-06-29 MED ORDER — FREESTYLE LIBRE SENSOR SYSTEM MISC: 3 refills | Status: DC

## 2022-06-30 ENCOUNTER — Ambulatory Visit: Payer: BC Managed Care – PPO | Attending: Orthopedic Surgery

## 2022-06-30 DIAGNOSIS — M25562 Pain in left knee: Secondary | ICD-10-CM | POA: Insufficient documentation

## 2022-06-30 DIAGNOSIS — M25662 Stiffness of left knee, not elsewhere classified: Secondary | ICD-10-CM | POA: Diagnosis not present

## 2022-06-30 NOTE — Therapy (Signed)
OUTPATIENT PHYSICAL THERAPY LOWER EXTREMITY TREATMENT   Patient Name: Tony Cantu MRN: [id number] DOB:[birth year], 71 y.o., male Today's Date: 06/30/2022   PT End of Session - 06/30/22 0903     Visit Number 9    Number of Visits 15    Date for PT Re-Evaluation 07/22/22    PT Start Time 0900    PT Stop Time 2725    PT Time Calculation (min) 59 min    Activity Tolerance Patient tolerated treatment well    Behavior During Therapy [facility] for tasks assessed/performed                   Past Medical History:  Diagnosis Date   Aortic atherosclerosis ([place])    Aortic stenosis    Arthritis    Asthma    BPH (benign prostatic hypertrophy)    Cirrhosis ([place]) 2016   Colon polyps    Diabetes mellitus without complication ([place])    TYPE 1    Diverticulosis    Dysrhythmia 04/19/2021   A-fib noted with possible A-fib RVR   Gastric ulcer    Gastric varices    right   Gastritis    GERD (gastroesophageal reflux disease)    Headache    History- pt states these were migraines that occurred in the 1980's   Heart murmur    History of kidney stones    Hx of adenomatous colonic polyps    Hypertension    PONV (postoperative nausea and vomiting)    pt states he has never had post op nausea or vomiting   Portal hypertensive gastropathy ([place])    Renal cyst, right    Seizures ([facility])    HYPOGLYCEMIC LAST 1 AND 1/2 YRS AGO   Sleep apnea    uses CPAP nightly   Testicle trouble    one testicle BORN WITH   Tubular adenoma of colon    Past Surgical History:  Procedure Laterality Date   BACK SURGERY     94  LOWER    CARPAL TUNNEL RELEASE Right 10/13/2015   Procedure: RIGHT CARPAL TUNNEL RELEASE;  Surgeon: Daryll Brod, MD;  Location: [place];  Service: Orthopedics;  Laterality: Right;   CARPAL TUNNEL RELEASE Left 07/05/2016   Procedure: LEFT CARPAL TUNNEL RELEASE;  Surgeon: Daryll Brod, MD;  Location: [place];  Service: Orthopedics;  Laterality: Left;    DRUG INDUCED ENDOSCOPY N/A 07/02/2021   Procedure: DRUG INDUCED SLEEP ENDOSCOPY;  Surgeon: Melida Quitter, MD;  Location: [place];  Service: ENT;  Laterality: N/A;   IMPLANTATION OF HYPOGLOSSAL NERVE STIMULATOR Right 10/01/2021   Procedure: IMPLANTATION OF HYPOGLOSSAL NERVE STIMULATOR;  Surgeon: Melida Quitter, MD;  Location: [place];  Service: ENT;  Laterality: Right;   INGUINAL HERNIA REPAIR  2003   right    KNEE ARTHROSCOPY Right 03/03/2016   LUMBAR DISC SURGERY  03/1995   Dr. Coralyn Mark, discectomy   LUMBAR LAMINECTOMY/DECOMPRESSION MICRODISCECTOMY Right 10/06/2016   Procedure: RIGHT LUMBAR THREE - LUMBAR FOUR  LAMINECTOMY, FORAMINOTOMY AND MICRODISCECTOMY;  Surgeon: Jovita Gamma, MD;  Location: [place];  Service: Neurosurgery;  Laterality: Right;   SHOULDER SURGERY  11/28/2005   left partial   SHOULDER SURGERY  07/14/2006   RIGHT   TONSILLECTOMY  AGE 43 OR 4   TOTAL KNEE ARTHROPLASTY Right 03/07/2022   Procedure: RIGHT TOTAL KNEE ARTHROPLASTY;  Surgeon: Frederik Pear, MD;  Location: [facility];  Service: Orthopedics;  Laterality: Right;   TOTAL KNEE ARTHROPLASTY Left 06/06/2022  Procedure: LEFT TOTAL KNEE ARTHROPLASTY;  Surgeon: Frederik Pear, MD;  Location: [facility];  Service: Orthopedics;  Laterality: Left;   TOTAL SHOULDER ARTHROPLASTY Right 11/01/2018   Procedure: RIGHT SHOULDER REVISION TO REVERSE TOTAL SHOULDER;  Surgeon: Tania Ade, MD;  Location: [facility];  Service: Orthopedics;  Laterality: Right;  CHOICE ANESTHESIA WITH INTERSCALENE BLOCK EXPAREL, NEEDS RNFA   Patient Active Problem List   Diagnosis Date Noted   S/P total knee arthroplasty, left 06/06/2022   Degenerative arthritis of left knee 06/03/2022   Chronic obstructive pulmonary disease ([place]) 05/10/2022   Grade I diastolic dysfunction 40/98/1191   Postoperative fever 03/10/2022   S/P TKR (total knee replacement), right 03/07/2022   Osteoarthritis of right knee 03/04/2022   Precordial chest pain 04/20/2021   Former smoker  01/12/2021   PAF (paroxysmal atrial fibrillation) ([place]) 06/22/2020   Hypertension associated with type 2 diabetes mellitus ([place]) 05/17/2019   Hyperlipidemia associated with type 2 diabetes mellitus ([place]) 05/17/2019   GERD with esophagitis 05/17/2019   H/O total shoulder replacement, right 11/01/2018   Paroxysmal tachycardia ([place]) 01/11/2017   Nonrheumatic aortic valve stenosis 01/11/2017   Aortic atherosclerosis ([place]) 11/23/2016   HNP (herniated nucleus pulposus), lumbar 10/06/2016   Acute renal injury ([place]) 08/06/2016   Diarrhea 08/06/2016   Fever 08/06/2016   Hyperbilirubinemia 08/06/2016   Lactic acidosis 08/06/2016   Inguinal hernia 12/10/2015   Right groin pain 11/30/2015   Carpal tunnel syndrome on right 09/09/2015   Cervical spondylosis without myelopathy 09/09/2015   Thrombocytopenia ([place]) 07/21/2014   Bilateral carotid bruits 05/11/2014   Vitamin D deficiency 09/17/2013   BPH (benign prostatic hyperplasia) 05/22/2013   Low serum testosterone level 02/18/2013   Diabetes type 2, controlled ([place]) 01/10/2013   OSA (obstructive sleep apnea) 05/16/2012   Edema 02/21/2012   At risk for coronary artery disease 03/20/2011   Obesity 03/20/2011   Allergic rhinitis 06/22/2010   ASTHMA 06/22/2010   COUGH 06/22/2010    PCP: [name], DO  REFERRING PROVIDER: Frederik Pear, MD  REFERRING DIAG: s/p Left TKA   THERAPY DIAG:  Acute pain of left knee  Stiffness of left knee, not elsewhere classified  Rationale for Evaluation and Treatment Rehabilitation  ONSET DATE: 06/06/22  SUBJECTIVE:   SUBJECTIVE STATEMENT: Patient reports that his knee is sore today.  PERTINENT HISTORY: HTN, DM, and OA  PAIN:  Are you having pain? Yes: NPRS scale: 3/10 Pain location: left knee Pain description: aching Aggravating factors: moving his knee and walking Relieving factors: medication and ice  PRECAUTIONS: None  WEIGHT BEARING RESTRICTIONS No  FALLS:  Has patient  fallen in last 6 months? No  LIVING ENVIRONMENT: Lives with: lives with their spouse Lives in: House/apartment Stairs: Yes: Internal: 20-25 steps; can reach both and External: 2 steps; can reach both Has following equipment at home: Gilford Rile - 2 wheeled  OCCUPATION: retired  PLOF: Independent  PATIENT GOALS: walking without assistive device, go outside an take pictures   OBJECTIVE: all objective measures were performed at his initial evaluation on 06/10/22 unless otherwise noted  PATIENT SURVEYS:  FOTO 16.73  COGNITION:  Overall cognitive status: Within functional limits for tasks assessed     SENSATION: Patient reports no numbness and tingling  EDEMA:  Moderate LLE edema (wearing compression stockings); no signs or symptoms of DVT  PALPATION: TTP: left patella and patellar tendon  LOWER EXTREMITY ROM:  Active ROM Right eval Left eval  Hip flexion    Hip extension    Hip abduction  Hip adduction    Hip internal rotation    Hip external rotation    Knee flexion 136 86/90 (PROM)  Knee extension 0 16  Ankle dorsiflexion    Ankle plantarflexion    Ankle inversion    Ankle eversion     (Blank rows = not tested)  LOWER EXTREMITY MMT: not tested due to surgical condition  FUNCTIONAL TESTS:  Required therapist assistance (modA) for sit to supine and sit to stand transfer   GAIT: Assistive device utilized: Environmental consultant - 2 wheeled Level of assistance: Modified independence Comments: step to pattern, poor foot clearance bilaterally, left knee flexed in stance, minimal left knee flexion in swing phase  TODAY'S TREATMENT:                                   11/2 EXERCISE LOG  Exercise Repetitions and Resistance Comments  Nustep  L3 x 15 minutes; seat 9-8   LAQ 6 lbs x 2 minutes    Seated HS curls Green t-band x 3 minutes   Rocker board 4 minutes   Cybex leg press 1 plate; seat 7 x 3 minutes   Standing HS stretch 3 x 30 seconds    Blank cell = exercise not performed  today  Modalities  Date:  [place]: Knee, 34 degrees; low pressure, 10 mins, Pain and Edema                                   10/30 EXERCISE LOG  Exercise Repetitions and Resistance Comments  Nustep  L4 x 15 minutes; seat 10-9   Rocker board  5 minutes    Lunges onto step 2 minutes    Sit to stand  30 reps  From elevated table  Step up 6" step x 3 minutes Alternating LE  LAQ 30 reps    Blank cell = exercise not performed today  Modalities  Date:  [place]: Knee, 34 degrees; low pressure, 15 mins, Pain and Edema                                   10/27 EXERCISE LOG  Exercise Repetitions and Resistance Comments  Nustep  L4 x 15 minutes; seat 11-10   Lunges onto step  14" step x 2 minutes   Gastroc stretch  4 x 30 seconds   Side stepping on foam 2 minutes   Sit to stand  20 reps  From    Blank cell = exercise not performed today  Modalities  Date:  [place]: Knee, 34 degrees; low pressure, 10 mins, Pain and Edema  PATIENT EDUCATION:  Education details: POC, healing, prognosis, HEP Person educated: Patient Education method: Explanation Education comprehension: verbalized understanding   HOME EXERCISE PROGRAM: HEP provided by his surgeon was reviewed  ASSESSMENT:  CLINICAL IMPRESSION: Patient was progressed with multiple new interventions for improved lower extremity strength and mobility with moderate difficulty and fatigue. He required minimal cueing with today's new interventions for proper exercise performance and pacing to facilitate proper biomechanics. He experienced no significant pain or discomfort with any of today's interventions. He reported feeling alright upon the conclusion of treatment. He continues to require skilled physical therapy to address his remaining impairments to return to his prior level of function.   OBJECTIVE IMPAIRMENTS Abnormal  gait, decreased activity tolerance, decreased endurance, decreased mobility, difficulty walking, decreased ROM, decreased  strength, hypomobility, increased edema, impaired flexibility, and pain.   ACTIVITY LIMITATIONS carrying, lifting, standing, squatting, sleeping, stairs, transfers, bed mobility, bathing, toileting, dressing, and locomotion level  PARTICIPATION LIMITATIONS: meal prep, cleaning, laundry, driving, shopping, and community activity  PERSONAL FACTORS 3+ comorbidities: HTN, DM, and OA  are also affecting patient's functional outcome.   REHAB POTENTIAL: Good  CLINICAL DECISION MAKING: Stable/uncomplicated  EVALUATION COMPLEXITY: Low   GOALS: Goals reviewed with patient? No  SHORT TERM GOALS: Target date: 07/01/2022  Patient will be independent with his initial HEP.  Baseline: Goal status: INITIAL  2.  Patient will be able to demonstrate active left knee extension within 10 degrees of neutral for improved gait mechanics.  Baseline:  Goal status: INITIAL  3.  Patient will be able to walk with a cane or the least restrictive assistive device for at least 80 feet.  Baseline:  Goal status: INITIAL  4.  Patient will be able to complete his daily activities without his familiar knee pain exceeding 6/10.  Baseline:  Goal status: INITIAL  LONG TERM GOALS: Target date: 07/22/2022   Patient will be independent with his advanced HEP.  Baseline:  Goal status: INITIAL  2.  Patient will be able to demonstrate active left knee extension within 5 degrees of neutral for improved gait mechanics.  Baseline:  Goal status: INITIAL  3.  Patient will be able to demonstrate at least 120 degree of active left knee flexion for improved function navigating stairs.  Baseline:  Goal status: INITIAL  4.  Patient will be able to navigate at least 4 steps with a reciprocal pattern for improved function with household navigation.  Baseline:  Goal status: INITIAL  5.  Patient will be able to complete his daily activities without his familiar pain exceeding 4/10.  Baseline:  Goal status:  INITIAL  PLAN: PT FREQUENCY: 2-3x/week  PT DURATION: 6 weeks  PLANNED INTERVENTIONS: Therapeutic exercises, Therapeutic activity, Neuromuscular re-education, Balance training, Gait training, Patient/Family education, Self Care, Joint mobilization, Stair training, Electrical stimulation, Cryotherapy, Moist heat, Vasopneumatic device, Manual therapy, and Re-evaluation  PLAN FOR NEXT SESSION: nustep, quad sets, heel slides, gastroc stretch, manual therapy, and modalities as needed   Darlin Coco, PT 06/30/2022, 1:57 PM

## 2022-07-04 ENCOUNTER — Ambulatory Visit: Payer: BC Managed Care – PPO

## 2022-07-04 DIAGNOSIS — M25662 Stiffness of left knee, not elsewhere classified: Secondary | ICD-10-CM

## 2022-07-04 DIAGNOSIS — E119 Type 2 diabetes mellitus without complications: Secondary | ICD-10-CM | POA: Diagnosis not present

## 2022-07-04 DIAGNOSIS — M25562 Pain in left knee: Secondary | ICD-10-CM

## 2022-07-04 NOTE — Progress Notes (Unsigned)
[facility], [place] [address]   CLINIC:  Medical Oncology/Hematology  PCP:  [name], DO [place] [id number] [phone]   REASON FOR VISIT:  Follow-up for thrombocytopenia  PRIOR THERAPY: None  CURRENT THERAPY: Surveillance  INTERVAL HISTORY:  [facility] 71 y.o. male returns for routine follow-up of his thrombocytopenia.  He was last seen by Tarri Abernethy PA-C on 12/30/2021.  At today's visit, he reports feeling well.***  He denies any recent hospitalizations, surgeries, new diagnoses, or changes in his baseline health status.  He continues to deny any major bleeding episodes.  *** *** He does report some easy bruising but denies any petechial rash. *** No fever, chills, night sweats, unintentional weight loss. He has ***% energy and ***% appetite. He endorses that he is maintaining a stable weight.   REVIEW OF SYSTEMS:*** Review of Systems  Constitutional:  Positive for fatigue. Negative for appetite change, chills, diaphoresis, fever and unexpected weight change.  HENT:   Negative for lump/mass and nosebleeds.   Eyes:  Negative for eye problems.  Respiratory:  Positive for cough. Negative for hemoptysis and shortness of breath.   Cardiovascular:  Negative for chest pain, leg swelling and palpitations.  Gastrointestinal:  Negative for abdominal pain, blood in stool, constipation, diarrhea, nausea and vomiting.  Genitourinary:  Negative for hematuria.   Skin: Negative.   Neurological:  Negative for dizziness, headaches and light-headedness.  Hematological:  Does not bruise/bleed easily.      PAST MEDICAL/SURGICAL HISTORY:  Past Medical History:  Diagnosis Date   Aortic atherosclerosis (HCC)    Aortic stenosis    Arthritis    Asthma    BPH (benign prostatic hypertrophy)    Cirrhosis ([facility]) 2016   Colon polyps    Diabetes mellitus without complication ([facility])    TYPE 1    Diverticulosis     Dysrhythmia 04/19/2021   A-fib noted with possible A-fib RVR   Gastric ulcer    Gastric varices    right   Gastritis    GERD (gastroesophageal reflux disease)    Headache    History- pt states these were migraines that occurred in the 1980's   Heart murmur    History of kidney stones    Hx of adenomatous colonic polyps    Hypertension    PONV (postoperative nausea and vomiting)    pt states he has never had post op nausea or vomiting   Portal hypertensive gastropathy (HCC)    Renal cyst, right    Seizures (HCC)    HYPOGLYCEMIC LAST 1 AND 1/2 YRS AGO   Sleep apnea    uses CPAP nightly   Testicle trouble    one testicle BORN WITH   Tubular adenoma of colon    Past Surgical History:  Procedure Laterality Date   BACK SURGERY     64  LOWER    CARPAL TUNNEL RELEASE Right 10/13/2015   Procedure: RIGHT CARPAL TUNNEL RELEASE;  Surgeon: Daryll Brod, MD;  Location: [facility];  Service: Orthopedics;  Laterality: Right;   CARPAL TUNNEL RELEASE Left 07/05/2016   Procedure: LEFT CARPAL TUNNEL RELEASE;  Surgeon: Daryll Brod, MD;  Location: [facility];  Service: Orthopedics;  Laterality: Left;   DRUG INDUCED ENDOSCOPY N/A 07/02/2021   Procedure: DRUG INDUCED SLEEP ENDOSCOPY;  Surgeon: Melida Quitter, MD;  Location: [facility];  Service: ENT;  Laterality: N/A;   IMPLANTATION OF HYPOGLOSSAL NERVE STIMULATOR Right  10/01/2021   Procedure: IMPLANTATION OF HYPOGLOSSAL NERVE STIMULATOR;  Surgeon: Melida Quitter, MD;  Location: [facility];  Service: ENT;  Laterality: Right;   INGUINAL HERNIA REPAIR  2003   right    KNEE ARTHROSCOPY Right 03/03/2016   LUMBAR DISC SURGERY  03/1995   Dr. Coralyn Mark, discectomy   LUMBAR LAMINECTOMY/DECOMPRESSION MICRODISCECTOMY Right 10/06/2016   Procedure: RIGHT LUMBAR THREE - LUMBAR FOUR  LAMINECTOMY, FORAMINOTOMY AND MICRODISCECTOMY;  Surgeon: Jovita Gamma, MD;  Location: [facility];  Service: Neurosurgery;  Laterality: Right;   SHOULDER SURGERY   11/28/2005   left partial   SHOULDER SURGERY  07/14/2006   RIGHT   TONSILLECTOMY  AGE 63 OR 4   TOTAL KNEE ARTHROPLASTY Right 03/07/2022   Procedure: RIGHT TOTAL KNEE ARTHROPLASTY;  Surgeon: Frederik Pear, MD;  Location: [facility];  Service: Orthopedics;  Laterality: Right;   TOTAL KNEE ARTHROPLASTY Left 06/06/2022   Procedure: LEFT TOTAL KNEE ARTHROPLASTY;  Surgeon: Frederik Pear, MD;  Location: [facility];  Service: Orthopedics;  Laterality: Left;   TOTAL SHOULDER ARTHROPLASTY Right 11/01/2018   Procedure: RIGHT SHOULDER REVISION TO REVERSE TOTAL SHOULDER;  Surgeon: Tania Ade, MD;  Location: [facility];  Service: Orthopedics;  Laterality: Right;  CHOICE ANESTHESIA WITH INTERSCALENE BLOCK EXPAREL, NEEDS RNFA     SOCIAL HISTORY:  Social History   Socioeconomic History   Marital status: Married    Spouse name: Not on file   Number of children: 1   Years of education: Not on file   Highest education level: Not on file  Occupational History   Occupation: retired   Tobacco Use   Smoking status: Former    Years: 34.00    Types: Cigarettes, Pipe    Quit date: 2005    Years since quitting: 18.8   Smokeless tobacco: Never   Tobacco comments:    quit 2005 smoked cigarettes for 5 yrs prior to pipe use  Vaping Use   Vaping Use: Never used  Substance and Sexual Activity   Alcohol use: Not Currently   Drug use: No   Sexual activity: Yes  Other Topics Concern   Not on file  Social History Narrative   ** Merged History Encounter **    Right handed    Social Determinants of Health   Financial Resource Strain: Low Risk  (06/09/2022)   Overall Financial Resource Strain (CARDIA)    Difficulty of Paying Living Expenses: Not hard at all  Food Insecurity: No Food Insecurity (06/06/2022)   Hunger Vital Sign    Worried About Running Out of Food in the Last Year: Never true    Ken Caryl in the Last Year: Never true  Transportation Needs: No Transportation Needs (06/09/2022)   PRAPARE -  Hydrologist (Medical): No    Lack of Transportation (Non-Medical): No  Physical Activity: Not on file  Stress: Not on file  Social Connections: Not on file  Intimate Partner Violence: Not At Risk (06/06/2022)   Humiliation, Afraid, Rape, and Kick questionnaire    Fear of Current or Ex-Partner: No    Emotionally Abused: No    Physically Abused: No    Sexually Abused: No    FAMILY HISTORY:  Family History  Problem Relation Age of Onset   Heart failure Mother    Heart attack Mother        in her 69's   Alzheimer's disease Mother    Diabetes Mother    Asthma Father    Suicidality  Father        in his 43's   Allergic rhinitis Sister    Heart failure Maternal Grandmother    Rheum arthritis Maternal Grandmother    Breast cancer Maternal Grandmother    Heart attack Maternal Grandmother        in her 68's   Colon cancer Neg Hx    Esophageal cancer Neg Hx    Pancreatic cancer Neg Hx    Liver disease Neg Hx     CURRENT MEDICATIONS:  Outpatient Encounter Medications as of 07/05/2022  Medication Sig   acetaminophen (TYLENOL) 500 MG tablet Take 1,000 mg by mouth every 6 (six) hours as needed for moderate pain.   apixaban (ELIQUIS) 2.5 MG TABS tablet Take 1 tablet (2.5 mg total) by mouth 2 (two) times daily.   atorvastatin (LIPITOR) 40 MG tablet TAKE 1 TABLET BY MOUTH DAILY (Patient taking differently: Take 40 mg by mouth at bedtime.)   b complex vitamins tablet Take 1 tablet by mouth daily.   Blood Glucose Monitoring Suppl (CONTOUR NEXT ONE) KIT Test BS QID and as needed Dx E11.9   Cholecalciferol (DIALYVITE VITAMIN D 5000) 125 MCG (5000 UT) capsule Take 5,000 Units by mouth daily.   Chromium Picolinate (CHROMIUM PICOLATE PO) Take 1 tablet by mouth daily.   CINNAMON PO Take 1,080 mg by mouth daily.   Coenzyme Q10 (COQ10) 100 MG CAPS Take 100 mg by mouth daily.   Continuous Blood Gluc Receiver (FREESTYLE LIBRE READER) DEVI 1 applicator by Does not apply  route as directed.   Continuous Blood Gluc Sensor (FREESTYLE LIBRE SENSOR SYSTEM) MISC Check BS eight (8) times a day. Dx E10.9   diltiazem (CARDIZEM) 30 MG tablet TAKE 1 TABLET BY MOUTH  TWICE DAILY   furosemide (LASIX) 20 MG tablet TAKE 1 AND 1/2 TABLETS BY MOUTH  DAILY (Patient taking differently: Take 20 mg by mouth daily.)   Glucagon, rDNA, (GLUCAGON EMERGENCY) 1 MG KIT INJECT AS DIRECTED INTO  UPPER ARM, THIGH OR  BUTTOCKS AS NEEDED FOR  SEVERE HYPOGLYCEMIA. SEEK  MEDICAL ATTENTION AFTER USE (Patient taking differently: Inject 1 mg into the muscle as needed (HYPOGLYCEMIA).)   Glucosamine-Chondroit-Vit C-Mn (GLUCOSAMINE 1500 COMPLEX) CAPS Take 2 capsules by mouth daily.   glucose blood (CONTOUR NEXT TEST) test strip Test BS QID and as needed Dx E11.9   insulin lispro (HUMALOG) 100 UNIT/ML injection INJECT SUBCUTANEOUSLY 20 TO 50  UNITS 3 TIMES DAILY BEFORE MEALS PER SLIDING SCALE (Patient taking differently: Inject 10-30 Units into the skin See admin instructions. Inject 10-30 units up to 7 times daily as needed for high blood sugar)   Magnesium 250 MG TABS Take 250 mg by mouth daily.   METAMUCIL FIBER PO Take 1 capsule by mouth 2 (two) times daily.   Microlet Lancets MISC Test BS QID and as needed Dx E11.9   montelukast (SINGULAIR) 10 MG tablet TAKE 1 TABLET BY MOUTH AT  BEDTIME   Multiple Vitamins-Minerals (EYE HEALTH) CAPS Take 1 capsule by mouth daily.   pantoprazole (PROTONIX) 40 MG tablet Take 1 tablet (40 mg total) by mouth 2 (two) times daily before a meal. NEEDS OFFICE VISIT FOR ADDITIONAL REFILLS   tiZANidine (ZANAFLEX) 2 MG tablet Take 2 mg by mouth every 8 (eight) hours as needed for muscle spasms.   TOUJEO MAX SOLOSTAR 300 UNIT/ML Solostar Pen INJECT SUBCUTANEOUSLY 110 UNITS  DAILY (Patient taking differently: Inject 70 Units into the skin at bedtime.)   Turmeric Curcumin 500 MG CAPS Take  500 mg by mouth daily.   No facility-administered encounter medications on file as of  07/05/2022.    ALLERGIES:  Allergies  Allergen Reactions   Dimetapp Children's Cold-Cough     Chest discomfort    Erythromycin Diarrhea   Sulfonamide Derivatives Diarrhea     PHYSICAL EXAM:*** ECOG PERFORMANCE STATUS: 1 - Symptomatic but completely ambulatory  There were no vitals filed for this visit. There were no vitals filed for this visit. Physical Exam Constitutional:      Appearance: Normal appearance. He is obese.  HENT:     Head: Normocephalic and atraumatic.     Mouth/Throat:     Mouth: Mucous membranes are moist.  Eyes:     Extraocular Movements: Extraocular movements intact.     Pupils: Pupils are equal, round, and reactive to light.  Cardiovascular:     Rate and Rhythm: Normal rate and regular rhythm.     Pulses: Normal pulses.     Heart sounds: Murmur heard.  Pulmonary:     Effort: Pulmonary effort is normal.     Breath sounds: Normal breath sounds.  Abdominal:     General: Bowel sounds are normal.     Palpations: Abdomen is soft.     Tenderness: There is no abdominal tenderness.  Musculoskeletal:        General: No swelling.     Right lower leg: No edema.     Left lower leg: No edema.  Lymphadenopathy:     Cervical: No cervical adenopathy.  Skin:    General: Skin is warm and dry.  Neurological:     General: No focal deficit present.     Mental Status: He is alert and oriented to person, place, and time.  Psychiatric:        Mood and Affect: Mood normal.        Behavior: Behavior normal.     LABORATORY DATA:  I have reviewed the labs as listed.  CBC    Component Value Date/Time   WBC 6.8 05/24/2022 0934   RBC 4.57 05/24/2022 0934   HGB 15.9 05/24/2022 0934   HGB 17.1 11/03/2021 0842   HCT 48.5 05/24/2022 0934   HCT 49.9 11/03/2021 0842   PLT 126 (L) 05/24/2022 0934   PLT 100 (LL) 11/03/2021 0842   MCV 106.1 (H) 05/24/2022 0934   MCV 101 (H) 11/03/2021 0842   MCH 34.8 (H) 05/24/2022 0934   MCHC 32.8 05/24/2022 0934   RDW 14.6  05/24/2022 0934   RDW 12.7 11/03/2021 0842   LYMPHSABS 1.4 03/10/2022 2110   LYMPHSABS 1.6 12/29/2020 0807   MONOABS 1.9 (H) 03/10/2022 2110   EOSABS 0.1 03/10/2022 2110   EOSABS 0.6 (H) 12/29/2020 0807   BASOSABS 0.0 03/10/2022 2110   BASOSABS 0.1 12/29/2020 0807      Latest Ref Rng & Units 05/24/2022    9:34 AM 03/12/2022    3:23 AM 03/11/2022    7:35 AM  CMP  Glucose 70 - 99 mg/dL 151  66  348   BUN 8 - 23 mg/dL _0 Creatinine 0.61 - 1.24 mg/dL 0.88  0.88  0.87   Sodium 135 - 145 mmol/L 141  142  136   Potassium 3.5 - 5.1 mmol/L 3.8  3.7  4.4   Chloride 98 - 111 mmol/L 110  105  105   CO2 22 - 32 mmol/L _1 Calcium 8.9 - 10.3 mg/dL 9.3  8.8  8.4   Total Protein 6.5 - 8.1 g/dL 7.2  6.2  6.0   Total Bilirubin 0.3 - 1.2 mg/dL 1.2  2.1  2.4   Alkaline Phos 38 - 126 U/L 82  67  65   AST 15 - 41 U/L 37  37  33   ALT 0 - 44 U/L 29  33  33     DIAGNOSTIC IMAGING:  I have independently reviewed the relevant imaging and discussed with the patient.  ASSESSMENT & PLAN: 1.  Mild thrombocytopenia, likely secondary to cirrhosis -  Seen at the request of Dr. Lajuana Ripple for mild thrombocytopenia since September 2020. - No history of bleeding.  Mild bruising.  Denies any B symptoms.*** - Patient started on Eliquis for paroxysmal atrial fibrillation since mid January 2023. - No prior history of blood transfusions. - No history of RA, lupus, or ulcerative colitis/Crohn's disease. - CT abdomen on 04/28/2021 with cirrhosis.  Spleen size normal. - His previous lab reports indicate possible clumping of platelets contributing to mild thrombocytopenia. - Hematology work-up (12/10/2021): Pathology smear review confirms mild thrombocytopenia. Negative hepatitis B and C.  Negative H. pylori. Normal rheumatoid factor, ANA, LDH. No nutritional deficiencies.  Normal B12, folate, methylmalonic acid, copper. SPEP and reticulocytes within normal limits. -CBC today (07/05/2022): *** - We  discussed differential diagnosis for thrombocytopenia.  Etiology favors liver disease, but could also be immune mediated. - PLAN: No indication for treatment at this time.*** - Repeat labs (CBC/D) with same-day RTC in 6 months. - If any significant changes in baseline blood counts, would consider bone marrow biopsy. - If platelets less than 30, would consider treatment, including trial of steroids to see if there is any improvement.  2.  Social/family history: - He retired from working at a [employer].  No chemicals exposure.  Quit smoking pipe in 2005.  He smoked pipe for 35 years. - No family history of thrombocytopenia.  Maternal grandmother had breast cancer.  Mother could have had intestinal cancer when she had part of intestine due to resected.  All questions were answered. The patient knows to call the clinic with any problems, questions or concerns.  Medical decision making: Low  Time spent on visit: I spent 15 minutes counseling the patient face to face. The total time spent in the appointment was 22 minutes and more than 50% was on counseling.   Harriett Rush, PA-C  ***

## 2022-07-04 NOTE — Therapy (Signed)
OUTPATIENT PHYSICAL THERAPY LOWER EXTREMITY TREATMENT   Patient Name: Tony Cantu MRN: [id number] DOB:1950/12/17, 71 y.o., male Today's Date: 07/04/2022   PT End of Session - 07/04/22 0904     Visit Number 10    Number of Visits 15    Date for PT Re-Evaluation 07/22/22    PT Start Time 0900    PT Stop Time 6378    PT Time Calculation (min) 62 min    Activity Tolerance Patient tolerated treatment well    Behavior During Therapy [facility] for tasks assessed/performed                   Past Medical History:  Diagnosis Date   Aortic atherosclerosis ([place])    Aortic stenosis    Arthritis    Asthma    BPH (benign prostatic hypertrophy)    Cirrhosis ([place]) 2016   Colon polyps    Diabetes mellitus without complication ([place])    TYPE 1    Diverticulosis    Dysrhythmia 04/19/2021   A-fib noted with possible A-fib RVR   Gastric ulcer    Gastric varices    right   Gastritis    GERD (gastroesophageal reflux disease)    Headache    History- pt states these were migraines that occurred in the 1980's   Heart murmur    History of kidney stones    Hx of adenomatous colonic polyps    Hypertension    PONV (postoperative nausea and vomiting)    pt states he has never had post op nausea or vomiting   Portal hypertensive gastropathy (HCC)    Renal cyst, right    Seizures (HCC)    HYPOGLYCEMIC LAST 1 AND 1/2 YRS AGO   Sleep apnea    uses CPAP nightly   Testicle trouble    one testicle BORN WITH   Tubular adenoma of colon    Past Surgical History:  Procedure Laterality Date   BACK SURGERY     94  LOWER    CARPAL TUNNEL RELEASE Right 10/13/2015   Procedure: RIGHT CARPAL TUNNEL RELEASE;  Surgeon: Daryll Brod, MD;  Location: [place];  Service: Orthopedics;  Laterality: Right;   CARPAL TUNNEL RELEASE Left 07/05/2016   Procedure: LEFT CARPAL TUNNEL RELEASE;  Surgeon: Daryll Brod, MD;  Location: [place];  Service: Orthopedics;  Laterality: Left;    DRUG INDUCED ENDOSCOPY N/A 07/02/2021   Procedure: DRUG INDUCED SLEEP ENDOSCOPY;  Surgeon: Melida Quitter, MD;  Location: [place];  Service: ENT;  Laterality: N/A;   IMPLANTATION OF HYPOGLOSSAL NERVE STIMULATOR Right 10/01/2021   Procedure: IMPLANTATION OF HYPOGLOSSAL NERVE STIMULATOR;  Surgeon: Melida Quitter, MD;  Location: [place];  Service: ENT;  Laterality: Right;   INGUINAL HERNIA REPAIR  2003   right    KNEE ARTHROSCOPY Right 03/03/2016   LUMBAR DISC SURGERY  03/1995   Dr. Coralyn Mark, discectomy   LUMBAR LAMINECTOMY/DECOMPRESSION MICRODISCECTOMY Right 10/06/2016   Procedure: RIGHT LUMBAR THREE - LUMBAR FOUR  LAMINECTOMY, FORAMINOTOMY AND MICRODISCECTOMY;  Surgeon: Jovita Gamma, MD;  Location: [place];  Service: Neurosurgery;  Laterality: Right;   SHOULDER SURGERY  11/28/2005   left partial   SHOULDER SURGERY  07/14/2006   RIGHT   TONSILLECTOMY  AGE 49 OR 4   TOTAL KNEE ARTHROPLASTY Right 03/07/2022   Procedure: RIGHT TOTAL KNEE ARTHROPLASTY;  Surgeon: Frederik Pear, MD;  Location: [facility];  Service: Orthopedics;  Laterality: Right;   TOTAL KNEE ARTHROPLASTY Left 06/06/2022  Procedure: LEFT TOTAL KNEE ARTHROPLASTY;  Surgeon: Frederik Pear, MD;  Location: [facility];  Service: Orthopedics;  Laterality: Left;   TOTAL SHOULDER ARTHROPLASTY Right 11/01/2018   Procedure: RIGHT SHOULDER REVISION TO REVERSE TOTAL SHOULDER;  Surgeon: Tania Ade, MD;  Location: [facility];  Service: Orthopedics;  Laterality: Right;  CHOICE ANESTHESIA WITH INTERSCALENE BLOCK EXPAREL, NEEDS RNFA   Patient Active Problem List   Diagnosis Date Noted   S/P total knee arthroplasty, left 06/06/2022   Degenerative arthritis of left knee 06/03/2022   Chronic obstructive pulmonary disease ([place]) 05/10/2022   Grade I diastolic dysfunction 36/62/9476   Postoperative fever 03/10/2022   S/P TKR (total knee replacement), right 03/07/2022   Osteoarthritis of right knee 03/04/2022   Precordial chest pain 04/20/2021   Former smoker  01/12/2021   PAF (paroxysmal atrial fibrillation) ([place]) 06/22/2020   Hypertension associated with type 2 diabetes mellitus ([place]) 05/17/2019   Hyperlipidemia associated with type 2 diabetes mellitus ([place]) 05/17/2019   GERD with esophagitis 05/17/2019   H/O total shoulder replacement, right 11/01/2018   Paroxysmal tachycardia ([place]) 01/11/2017   Nonrheumatic aortic valve stenosis 01/11/2017   Aortic atherosclerosis ([place]) 11/23/2016   HNP (herniated nucleus pulposus), lumbar 10/06/2016   Acute renal injury ([place]) 08/06/2016   Diarrhea 08/06/2016   Fever 08/06/2016   Hyperbilirubinemia 08/06/2016   Lactic acidosis 08/06/2016   Inguinal hernia 12/10/2015   Right groin pain 11/30/2015   Carpal tunnel syndrome on right 09/09/2015   Cervical spondylosis without myelopathy 09/09/2015   Thrombocytopenia ([place]) 07/21/2014   Bilateral carotid bruits 05/11/2014   Vitamin D deficiency 09/17/2013   BPH (benign prostatic hyperplasia) 05/22/2013   Low serum testosterone level 02/18/2013   Diabetes type 2, controlled ([place]) 01/10/2013   OSA (obstructive sleep apnea) 05/16/2012   Edema 02/21/2012   At risk for coronary artery disease 03/20/2011   Obesity 03/20/2011   Allergic rhinitis 06/22/2010   ASTHMA 06/22/2010   COUGH 06/22/2010    PCP: [name], DO  REFERRING PROVIDER: Frederik Pear, MD  REFERRING DIAG: s/p Left TKA   THERAPY DIAG:  Acute pain of left knee  Stiffness of left knee, not elsewhere classified  Rationale for Evaluation and Treatment Rehabilitation  ONSET DATE: 06/06/22  SUBJECTIVE:   SUBJECTIVE STATEMENT: Patient reports that his knee is a little sore today as he "stepped wrong" earlier. He feels that his knee is getting better as he is now using a cane to get around. He feels that he is about 65% of his prior level of function.   PERTINENT HISTORY: HTN, DM, and OA  PAIN:  Are you having pain? Yes: NPRS scale: 2/10 Pain location: left knee Pain  description: aching Aggravating factors: moving his knee and walking Relieving factors: medication and ice  PRECAUTIONS: None  WEIGHT BEARING RESTRICTIONS No  FALLS:  Has patient fallen in last 6 months? No  LIVING ENVIRONMENT: Lives with: lives with their spouse Lives in: House/apartment Stairs: Yes: Internal: 20-25 steps; can reach both and External: 2 steps; can reach both Has following equipment at home: Walker - 2 wheeled  OCCUPATION: retired  PLOF: Independent  PATIENT GOALS: walking without assistive device, go outside an take pictures   OBJECTIVE: all objective measures were performed at his initial evaluation on 06/10/22 unless otherwise noted  PATIENT SURVEYS:  FOTO 41.18  COGNITION:  Overall cognitive status: Within functional limits for tasks assessed     SENSATION: Patient reports no numbness and tingling  EDEMA:  Moderate LLE edema (wearing compression  stockings); no signs or symptoms of DVT  PALPATION: TTP: left patella and patellar tendon  LOWER EXTREMITY ROM:  Active ROM Right eval Left eval Left 07/04/22  Hip flexion     Hip extension     Hip abduction     Hip adduction     Hip internal rotation     Hip external rotation     Knee flexion 136 86/90 (PROM) 110/ 117 (PROM)  Knee extension 0 16 6  Ankle dorsiflexion     Ankle plantarflexion     Ankle inversion     Ankle eversion      (Blank rows = not tested)  LOWER EXTREMITY MMT: not tested due to surgical condition  FUNCTIONAL TESTS:  Required therapist assistance (modA) for sit to supine and sit to stand transfer   GAIT: Assistive device utilized: Environmental consultant - 2 wheeled Level of assistance: Modified independence Comments: step to pattern, poor foot clearance bilaterally, left knee flexed in stance, minimal left knee flexion in swing phase  TODAY'S TREATMENT:                                   11/6 EXERCISE LOG  Exercise Repetitions and Resistance Comments  Nustep  L4 x 15 minutes;  seat 8    Rocker board 5 minutes   Seated heel slide 2 minutes   LAQ 4 lbs x 3 minutes   Cybex leg press 1 plate; seat 7 x 20 reps   Gastroc stretch  3 x 30 seconds   Side stepping 2 minutes     Blank cell = exercise not performed today  Modalities  Date:  [place]: Knee, 34 degrees; low pressure, 15 mins, Pain and Edema                                   11/2 EXERCISE LOG  Exercise Repetitions and Resistance Comments  Nustep  L3 x 15 minutes; seat 9-8   LAQ 6 lbs x 2 minutes    Seated HS curls Green t-band x 3 minutes   Rocker board 4 minutes   Cybex leg press 1 plate; seat 7 x 3 minutes   Standing HS stretch 3 x 30 seconds    Blank cell = exercise not performed today  Modalities  Date:  [place]: Knee, 34 degrees; low pressure, 10 mins, Pain and Edema                                   10/30 EXERCISE LOG  Exercise Repetitions and Resistance Comments  Nustep  L4 x 15 minutes; seat 10-9   Rocker board  5 minutes    Lunges onto step 2 minutes    Sit to stand  30 reps  From elevated table  Step up 6" step x 3 minutes Alternating LE  LAQ 30 reps    Blank cell = exercise not performed today  Modalities  Date:  [place]: Knee, 34 degrees; low pressure, 15 mins, Pain and Edema  PATIENT EDUCATION:  Education details: POC, healing, prognosis, HEP Person educated: Patient Education method: Explanation Education comprehension: verbalized understanding   HOME EXERCISE PROGRAM: HEP provided by his surgeon was reviewed  ASSESSMENT:  CLINICAL IMPRESSION: Patient is making good progress with skilled physical therapy as evidenced by subjective  reports, objective measures, functional mobility, and progress toward his goals. He was able to demonstrate significant improvements in left knee active and passive ROM compared to his initial evaluation. Treatment focused on improved knee mobility through the use of familiar interventions. He required minimal cueing with these interventions for  proper exercise performance. He reported that his knee felt better upon the conclusion of treatment. He continues to require skilled physical therapy to address his remaining impairments to return to his prior level of function.   OBJECTIVE IMPAIRMENTS Abnormal gait, decreased activity tolerance, decreased endurance, decreased mobility, difficulty walking, decreased ROM, decreased strength, hypomobility, increased edema, impaired flexibility, and pain.   ACTIVITY LIMITATIONS carrying, lifting, standing, squatting, sleeping, stairs, transfers, bed mobility, bathing, toileting, dressing, and locomotion level  PARTICIPATION LIMITATIONS: meal prep, cleaning, laundry, driving, shopping, and community activity  PERSONAL FACTORS 3+ comorbidities: HTN, DM, and OA  are also affecting patient's functional outcome.   REHAB POTENTIAL: Good  CLINICAL DECISION MAKING: Stable/uncomplicated  EVALUATION COMPLEXITY: Low   GOALS: Goals reviewed with patient? No  SHORT TERM GOALS: Target date: 07/01/2022  Patient will be independent with his initial HEP.  Baseline: Goal status: MET  2.  Patient will be able to demonstrate active left knee extension within 10 degrees of neutral for improved gait mechanics.  Baseline:  Goal status: MET  3.  Patient will be able to walk with a cane or the least restrictive assistive device for at least 80 feet.  Baseline:  Goal status: MET  4.  Patient will be able to complete his daily activities without his familiar knee pain exceeding 6/10.  Baseline:  Goal status: MET  LONG TERM GOALS: Target date: 07/22/2022   Patient will be independent with his advanced HEP.  Baseline:  Goal status: IN PROGRESS  2.  Patient will be able to demonstrate active left knee extension within 5 degrees of neutral for improved gait mechanics.  Baseline:  Goal status: IN PROGRESS  3.  Patient will be able to demonstrate at least 120 degree of active left knee flexion for improved  function navigating stairs.  Baseline:  Goal status: IN PROGRESS  4.  Patient will be able to navigate at least 4 steps with a reciprocal pattern for improved function with household navigation.  Baseline:  Goal status: IN PROGRESS  5.  Patient will be able to complete his daily activities without his familiar pain exceeding 4/10.  Baseline:  Goal status: MET  PLAN: PT FREQUENCY: 2-3x/week  PT DURATION: 6 weeks  PLANNED INTERVENTIONS: Therapeutic exercises, Therapeutic activity, Neuromuscular re-education, Balance training, Gait training, Patient/Family education, Self Care, Joint mobilization, Stair training, Electrical stimulation, Cryotherapy, Moist heat, Vasopneumatic device, Manual therapy, and Re-evaluation  PLAN FOR NEXT SESSION: nustep, quad sets, heel slides, gastroc stretch, manual therapy, and modalities as needed   Darlin Coco, PT 07/04/2022, 12:43 PM

## 2022-07-05 ENCOUNTER — Inpatient Hospital Stay: Payer: BC Managed Care – PPO | Attending: Physician Assistant | Admitting: Physician Assistant

## 2022-07-05 ENCOUNTER — Inpatient Hospital Stay: Payer: BC Managed Care – PPO

## 2022-07-05 VITALS — BP 124/72 | HR 73 | Temp 98.5°F | Resp 18 | Ht 68.0 in | Wt 224.0 lb

## 2022-07-05 DIAGNOSIS — D696 Thrombocytopenia, unspecified: Secondary | ICD-10-CM

## 2022-07-05 DIAGNOSIS — F32A Depression, unspecified: Secondary | ICD-10-CM | POA: Diagnosis not present

## 2022-07-05 DIAGNOSIS — E119 Type 2 diabetes mellitus without complications: Secondary | ICD-10-CM | POA: Diagnosis not present

## 2022-07-05 DIAGNOSIS — R5383 Other fatigue: Secondary | ICD-10-CM | POA: Insufficient documentation

## 2022-07-05 DIAGNOSIS — Z794 Long term (current) use of insulin: Secondary | ICD-10-CM | POA: Diagnosis not present

## 2022-07-05 DIAGNOSIS — Z8719 Personal history of other diseases of the digestive system: Secondary | ICD-10-CM | POA: Insufficient documentation

## 2022-07-05 DIAGNOSIS — Z79899 Other long term (current) drug therapy: Secondary | ICD-10-CM | POA: Insufficient documentation

## 2022-07-05 DIAGNOSIS — Z825 Family history of asthma and other chronic lower respiratory diseases: Secondary | ICD-10-CM | POA: Diagnosis not present

## 2022-07-05 DIAGNOSIS — Z8261 Family history of arthritis: Secondary | ICD-10-CM | POA: Insufficient documentation

## 2022-07-05 DIAGNOSIS — Z818 Family history of other mental and behavioral disorders: Secondary | ICD-10-CM | POA: Diagnosis not present

## 2022-07-05 DIAGNOSIS — E1122 Type 2 diabetes mellitus with diabetic chronic kidney disease: Secondary | ICD-10-CM | POA: Diagnosis not present

## 2022-07-05 DIAGNOSIS — Z86010 Personal history of colonic polyps: Secondary | ICD-10-CM | POA: Diagnosis not present

## 2022-07-05 DIAGNOSIS — I350 Nonrheumatic aortic (valve) stenosis: Secondary | ICD-10-CM | POA: Insufficient documentation

## 2022-07-05 DIAGNOSIS — Z803 Family history of malignant neoplasm of breast: Secondary | ICD-10-CM | POA: Diagnosis not present

## 2022-07-05 DIAGNOSIS — E669 Obesity, unspecified: Secondary | ICD-10-CM | POA: Diagnosis not present

## 2022-07-05 DIAGNOSIS — Z87891 Personal history of nicotine dependence: Secondary | ICD-10-CM | POA: Insufficient documentation

## 2022-07-05 DIAGNOSIS — Z8249 Family history of ischemic heart disease and other diseases of the circulatory system: Secondary | ICD-10-CM | POA: Diagnosis not present

## 2022-07-05 DIAGNOSIS — Z7901 Long term (current) use of anticoagulants: Secondary | ICD-10-CM | POA: Insufficient documentation

## 2022-07-05 DIAGNOSIS — Z881 Allergy status to other antibiotic agents status: Secondary | ICD-10-CM | POA: Diagnosis not present

## 2022-07-05 DIAGNOSIS — Z833 Family history of diabetes mellitus: Secondary | ICD-10-CM | POA: Diagnosis not present

## 2022-07-05 DIAGNOSIS — Z882 Allergy status to sulfonamides status: Secondary | ICD-10-CM | POA: Insufficient documentation

## 2022-07-05 DIAGNOSIS — Z87442 Personal history of urinary calculi: Secondary | ICD-10-CM | POA: Insufficient documentation

## 2022-07-05 DIAGNOSIS — G4730 Sleep apnea, unspecified: Secondary | ICD-10-CM | POA: Diagnosis not present

## 2022-07-05 DIAGNOSIS — N182 Chronic kidney disease, stage 2 (mild): Secondary | ICD-10-CM | POA: Diagnosis not present

## 2022-07-05 LAB — CBC WITH DIFFERENTIAL/PLATELET
Abs Immature Granulocytes: 0.02 10*3/uL (ref 0.00–0.07)
Basophils Absolute: 0 10*3/uL (ref 0.0–0.1)
Basophils Relative: 1 %
Eosinophils Absolute: 0.3 10*3/uL (ref 0.0–0.5)
Eosinophils Relative: 5 %
HCT: 45.3 % (ref 39.0–52.0)
Hemoglobin: 15 g/dL (ref 13.0–17.0)
Immature Granulocytes: 0 %
Lymphocytes Relative: 22 %
Lymphs Abs: 1.2 10*3/uL (ref 0.7–4.0)
MCH: 34.7 pg — ABNORMAL HIGH (ref 26.0–34.0)
MCHC: 33.1 g/dL (ref 30.0–36.0)
MCV: 104.9 fL — ABNORMAL HIGH (ref 80.0–100.0)
Monocytes Absolute: 1 10*3/uL (ref 0.1–1.0)
Monocytes Relative: 18 %
Neutro Abs: 3 10*3/uL (ref 1.7–7.7)
Neutrophils Relative %: 54 %
Platelets: 113 10*3/uL — ABNORMAL LOW (ref 150–400)
RBC: 4.32 MIL/uL (ref 4.22–5.81)
RDW: 16.3 % — ABNORMAL HIGH (ref 11.5–15.5)
WBC: 5.5 10*3/uL (ref 4.0–10.5)
nRBC: 0 % (ref 0.0–0.2)

## 2022-07-05 NOTE — Patient Instructions (Signed)
[place] at [facility] Discharge Instructions  You were seen today by Tarri Abernethy PA-C for your low platelets (thrombocytopenia).  Your lab tests did not reveal any alternative causes of low platelets, so we believe that your low platelets are related to your liver cirrhosis.  You may also have some aspect of immune system dysfunction which is causing your body to attack your platelets. - Regardless of cause, you do not need treatment at this point in time. - As long as you have platelets higher than 50, you are not at severe risk of major bleeding events.  If your platelets drop to less than 30, we would consider various treatment options. - We will continue to monitor your platelet levels with repeat labs and office visit in 6 months. - Please let us know if you have any major bleeding events or severe bruising prior to that time.  FOLLOW-UP APPOINTMENT: Repeat labs and follow-up visit in 6 months   Thank you for choosing [place] at [facility] to provide your oncology and hematology care.  To afford each patient quality time with our provider, please arrive at least 15 minutes before your scheduled appointment time.   If you have a lab appointment with the [facility] please come in thru the Main Entrance and check in at the main information desk.  You need to re-schedule your appointment should you arrive 10 or more minutes late.  We strive to give you quality time with our providers, and arriving late affects you and other patients whose appointments are after yours.  Also, if you no show three or more times for appointments you may be dismissed from the clinic at the providers discretion.     Again, thank you for choosing [facility].  Our hope is that these requests will decrease the amount of time that you wait before being seen by our physicians.        _____________________________________________________________  Should you have questions after your visit to [facility], please contact our office at [phone] and follow the prompts.  Our office hours are 8:00 a.m. and 4:30 p.m. Monday - Friday.  Please note that voicemails left after 4:00 p.m. may not be returned until the following business day.  We are closed weekends and major holidays.  You do have access to a nurse 24-7, just call the main number to the clinic [phone] and do not press any options, hold on the line and a nurse will answer the phone.    For prescription refill requests, have your pharmacy contact our office and allow 72 hours.    Due to Covid, you will need to wear a mask upon entering the hospital. If you do not have a mask, a mask will be given to you at the Main Entrance upon arrival. For doctor visits, patients may have 1 support person age 51 or older with them. For treatment visits, patients can not have anyone with them due to social distancing guidelines and our immunocompromised population.

## 2022-07-07 ENCOUNTER — Ambulatory Visit: Payer: BC Managed Care – PPO

## 2022-07-07 DIAGNOSIS — M25662 Stiffness of left knee, not elsewhere classified: Secondary | ICD-10-CM

## 2022-07-07 DIAGNOSIS — M25562 Pain in left knee: Secondary | ICD-10-CM

## 2022-07-07 NOTE — Therapy (Signed)
OUTPATIENT PHYSICAL THERAPY LOWER EXTREMITY TREATMENT   Patient Name: Tony Cantu MRN: [id number] DOB:04/02/1951, 71 y.o., male Today's Date: 07/07/2022   PT End of Session - 07/07/22 0903     Visit Number 11    Number of Visits 15    Date for PT Re-Evaluation 07/22/22    PT Start Time 0900    PT Stop Time 5916    PT Time Calculation (min) 62 min    Activity Tolerance Patient tolerated treatment well    Behavior During Therapy [facility] for tasks assessed/performed                    Past Medical History:  Diagnosis Date   Aortic atherosclerosis ([place])    Aortic stenosis    Arthritis    Asthma    BPH (benign prostatic hypertrophy)    Cirrhosis ([place]) 2016   Colon polyps    Diabetes mellitus without complication ([place])    TYPE 1    Diverticulosis    Dysrhythmia 04/19/2021   A-fib noted with possible A-fib RVR   Gastric ulcer    Gastric varices    right   Gastritis    GERD (gastroesophageal reflux disease)    Headache    History- pt states these were migraines that occurred in the 1980's   Heart murmur    History of kidney stones    Hx of adenomatous colonic polyps    Hypertension    PONV (postoperative nausea and vomiting)    pt states he has never had post op nausea or vomiting   Portal hypertensive gastropathy (HCC)    Renal cyst, right    Seizures (HCC)    HYPOGLYCEMIC LAST 1 AND 1/2 YRS AGO   Sleep apnea    uses CPAP nightly   Testicle trouble    one testicle BORN WITH   Tubular adenoma of colon    Past Surgical History:  Procedure Laterality Date   BACK SURGERY     94  LOWER    CARPAL TUNNEL RELEASE Right 10/13/2015   Procedure: RIGHT CARPAL TUNNEL RELEASE;  Surgeon: Daryll Brod, MD;  Location: [place];  Service: Orthopedics;  Laterality: Right;   CARPAL TUNNEL RELEASE Left 07/05/2016   Procedure: LEFT CARPAL TUNNEL RELEASE;  Surgeon: Daryll Brod, MD;  Location: [place];  Service: Orthopedics;  Laterality: Left;    DRUG INDUCED ENDOSCOPY N/A 07/02/2021   Procedure: DRUG INDUCED SLEEP ENDOSCOPY;  Surgeon: Melida Quitter, MD;  Location: [place];  Service: ENT;  Laterality: N/A;   IMPLANTATION OF HYPOGLOSSAL NERVE STIMULATOR Right 10/01/2021   Procedure: IMPLANTATION OF HYPOGLOSSAL NERVE STIMULATOR;  Surgeon: Melida Quitter, MD;  Location: [place];  Service: ENT;  Laterality: Right;   INGUINAL HERNIA REPAIR  2003   right    KNEE ARTHROSCOPY Right 03/03/2016   LUMBAR DISC SURGERY  03/1995   Dr. Coralyn Mark, discectomy   LUMBAR LAMINECTOMY/DECOMPRESSION MICRODISCECTOMY Right 10/06/2016   Procedure: RIGHT LUMBAR THREE - LUMBAR FOUR  LAMINECTOMY, FORAMINOTOMY AND MICRODISCECTOMY;  Surgeon: Jovita Gamma, MD;  Location: [place];  Service: Neurosurgery;  Laterality: Right;   SHOULDER SURGERY  11/28/2005   left partial   SHOULDER SURGERY  07/14/2006   RIGHT   TONSILLECTOMY  AGE 61 OR 4   TOTAL KNEE ARTHROPLASTY Right 03/07/2022   Procedure: RIGHT TOTAL KNEE ARTHROPLASTY;  Surgeon: Frederik Pear, MD;  Location: [facility];  Service: Orthopedics;  Laterality: Right;   TOTAL KNEE ARTHROPLASTY Left 06/06/2022  Procedure: LEFT TOTAL KNEE ARTHROPLASTY;  Surgeon: Frederik Pear, MD;  Location: [facility];  Service: Orthopedics;  Laterality: Left;   TOTAL SHOULDER ARTHROPLASTY Right 11/01/2018   Procedure: RIGHT SHOULDER REVISION TO REVERSE TOTAL SHOULDER;  Surgeon: Tania Ade, MD;  Location: [facility];  Service: Orthopedics;  Laterality: Right;  CHOICE ANESTHESIA WITH INTERSCALENE BLOCK EXPAREL, NEEDS RNFA   Patient Active Problem List   Diagnosis Date Noted   S/P total knee arthroplasty, left 06/06/2022   Degenerative arthritis of left knee 06/03/2022   Chronic obstructive pulmonary disease ([place]) 05/10/2022   Grade I diastolic dysfunction 58/04/9832   Postoperative fever 03/10/2022   S/P TKR (total knee replacement), right 03/07/2022   Osteoarthritis of right knee 03/04/2022   Precordial chest pain 04/20/2021   Former smoker  01/12/2021   PAF (paroxysmal atrial fibrillation) ([place]) 06/22/2020   Hypertension associated with type 2 diabetes mellitus ([place]) 05/17/2019   Hyperlipidemia associated with type 2 diabetes mellitus ([place]) 05/17/2019   GERD with esophagitis 05/17/2019   H/O total shoulder replacement, right 11/01/2018   Paroxysmal tachycardia ([place]) 01/11/2017   Nonrheumatic aortic valve stenosis 01/11/2017   Aortic atherosclerosis ([place]) 11/23/2016   HNP (herniated nucleus pulposus), lumbar 10/06/2016   Acute renal injury ([place]) 08/06/2016   Diarrhea 08/06/2016   Fever 08/06/2016   Hyperbilirubinemia 08/06/2016   Lactic acidosis 08/06/2016   Inguinal hernia 12/10/2015   Right groin pain 11/30/2015   Carpal tunnel syndrome on right 09/09/2015   Cervical spondylosis without myelopathy 09/09/2015   Thrombocytopenia ([place]) 07/21/2014   Bilateral carotid bruits 05/11/2014   Vitamin D deficiency 09/17/2013   BPH (benign prostatic hyperplasia) 05/22/2013   Low serum testosterone level 02/18/2013   Diabetes type 2, controlled ([place]) 01/10/2013   OSA (obstructive sleep apnea) 05/16/2012   Edema 02/21/2012   At risk for coronary artery disease 03/20/2011   Obesity 03/20/2011   Allergic rhinitis 06/22/2010   ASTHMA 06/22/2010   COUGH 06/22/2010    PCP: [name], DO  REFERRING PROVIDER: Frederik Pear, MD  REFERRING DIAG: s/p Left TKA   THERAPY DIAG:  Acute pain of left knee  Stiffness of left knee, not elsewhere classified  Rationale for Evaluation and Treatment Rehabilitation  ONSET DATE: 06/06/22  SUBJECTIVE:   SUBJECTIVE STATEMENT: Patient reports that his knee is aching and sticking some this morning.   PERTINENT HISTORY: HTN, DM, and OA  PAIN:  Are you having pain? Yes: NPRS scale: 2/10 Pain location: left knee Pain description: aching Aggravating factors: moving his knee and walking Relieving factors: medication and ice  PRECAUTIONS: None  WEIGHT BEARING RESTRICTIONS  No  FALLS:  Has patient fallen in last 6 months? No  LIVING ENVIRONMENT: Lives with: lives with their spouse Lives in: House/apartment Stairs: Yes: Internal: 20-25 steps; can reach both and External: 2 steps; can reach both Has following equipment at home: Gilford Rile - 2 wheeled  OCCUPATION: retired  PLOF: Independent  PATIENT GOALS: walking without assistive device, go outside an take pictures   OBJECTIVE: all objective measures were performed at his initial evaluation on 06/10/22 unless otherwise noted  PATIENT SURVEYS:  FOTO 41.18  COGNITION:  Overall cognitive status: Within functional limits for tasks assessed     SENSATION: Patient reports no numbness and tingling  EDEMA:  Moderate LLE edema (wearing compression stockings); no signs or symptoms of DVT  PALPATION: TTP: left patella and patellar tendon  LOWER EXTREMITY ROM:  Active ROM Right eval Left eval Left 07/04/22  Hip flexion  Hip extension     Hip abduction     Hip adduction     Hip internal rotation     Hip external rotation     Knee flexion 136 86/90 (PROM) 110/ 117 (PROM)  Knee extension 0 16 6  Ankle dorsiflexion     Ankle plantarflexion     Ankle inversion     Ankle eversion      (Blank rows = not tested)  LOWER EXTREMITY MMT: not tested due to surgical condition  FUNCTIONAL TESTS:  Required therapist assistance (modA) for sit to supine and sit to stand transfer   GAIT: Assistive device utilized: Environmental consultant - 2 wheeled Level of assistance: Modified independence Comments: step to pattern, poor foot clearance bilaterally, left knee flexed in stance, minimal left knee flexion in swing phase  TODAY'S TREATMENT:                                   11/9 EXERCISE LOG  Exercise Repetitions and Resistance Comments  Nustep  L4 x 15 minutes; seat 9-6   Rocker board  4 minutes   Lunges onto step 14" step x 20 reps   Step up 6" step x 20 reps each    Cybex knee flexion  50# x 20 reps    Cybex knee  extension 20# x 2 minutes     Blank cell = exercise not performed today  Manual Therapy Soft Tissue Mobilization: quadriceps, for improved knee mobility  Joint Mobilizations: patellar, grade I-IV Passive ROM: flexion and extension, to tolerance    Modalities  Date:  [place]: Knee, 34 degrees; low pressure, 10 mins, Pain and Edema                                   11/6 EXERCISE LOG  Exercise Repetitions and Resistance Comments  Nustep  L4 x 15 minutes; seat 8    Rocker board 5 minutes   Seated heel slide 2 minutes   LAQ 4 lbs x 3 minutes   Cybex leg press 1 plate; seat 7 x 20 reps   Gastroc stretch  3 x 30 seconds   Side stepping 2 minutes     Blank cell = exercise not performed today  Modalities  Date:  [place]as[place]: Knee, 34 degrees; low pressure, 15 mins, Pain and Edema                                   11/2 EXERCISE LOG  Exercise Repetitions and Resistance Comments  Nustep  L3 x 15 minutes; seat 9-8   LAQ 6 lbs x 2 minutes    Seated HS curls Green t-band x 3 minutes   Rocker board 4 minutes   Cybex leg press 1 plate; seat 7 x 3 minutes   Standing HS stretch 3 x 30 seconds    Blank cell = exercise not performed today  Modalities  Date:  [place]as[place]: Knee, 34 degrees; low pressure, 10 mins, Pain and Edema   PATIENT EDUCATION:  Education details: POC, healing, prognosis, HEP Person educated: Patient Education method: Explanation Education comprehension: verbalized understanding   HOME EXERCISE PROGRAM: HEP provided by his surgeon was reviewed  ASSESSMENT:  CLINICAL IMPRESSION: Patient was progressed with multiple new and familiar interventions for improved knee mobility and  strength with moderate difficulty. He required minimal cueing with weighted knee flexion for full knee flexion AROM to promote hamstring strengthening.  Manual therapy focused on soft tissue mobilization to the quadriceps and passive range of motion for improved knee mobility. He reported feeling alright upon  the conclusion of treatment. He continues to require skilled physical therapy to address his remaining impairments to return to his prior level of function.   OBJECTIVE IMPAIRMENTS Abnormal gait, decreased activity tolerance, decreased endurance, decreased mobility, difficulty walking, decreased ROM, decreased strength, hypomobility, increased edema, impaired flexibility, and pain.   ACTIVITY LIMITATIONS carrying, lifting, standing, squatting, sleeping, stairs, transfers, bed mobility, bathing, toileting, dressing, and locomotion level  PARTICIPATION LIMITATIONS: meal prep, cleaning, laundry, driving, shopping, and community activity  PERSONAL FACTORS 3+ comorbidities: HTN, DM, and OA  are also affecting patient's functional outcome.   REHAB POTENTIAL: Good  CLINICAL DECISION MAKING: Stable/uncomplicated  EVALUATION COMPLEXITY: Low   GOALS: Goals reviewed with patient? No  SHORT TERM GOALS: Target date: 07/01/2022  Patient will be independent with his initial HEP.  Baseline: Goal status: MET  2.  Patient will be able to demonstrate active left knee extension within 10 degrees of neutral for improved gait mechanics.  Baseline:  Goal status: MET  3.  Patient will be able to walk with a cane or the least restrictive assistive device for at least 80 feet.  Baseline:  Goal status: MET  4.  Patient will be able to complete his daily activities without his familiar knee pain exceeding 6/10.  Baseline:  Goal status: MET  LONG TERM GOALS: Target date: 07/22/2022   Patient will be independent with his advanced HEP.  Baseline:  Goal status: IN PROGRESS  2.  Patient will be able to demonstrate active left knee extension within 5 degrees of neutral for improved gait mechanics.  Baseline: 4 degrees on 07/07/22 Goal status: MET  3.  Patient will be able to demonstrate at least 120 degree of active left knee flexion for improved function navigating stairs.  Baseline: 109 degrees of  AROM; 118 degrees PROM  Goal status: IN PROGRESS  4.  Patient will be able to navigate at least 4 steps with a reciprocal pattern for improved function with household navigation.  Baseline:  Goal status: IN PROGRESS  5.  Patient will be able to complete his daily activities without his familiar pain exceeding 4/10.  Baseline:  Goal status: MET  PLAN: PT FREQUENCY: 2-3x/week  PT DURATION: 6 weeks  PLANNED INTERVENTIONS: Therapeutic exercises, Therapeutic activity, Neuromuscular re-education, Balance training, Gait training, Patient/Family education, Self Care, Joint mobilization, Stair training, Electrical stimulation, Cryotherapy, Moist heat, Vasopneumatic device, Manual therapy, and Re-evaluation  PLAN FOR NEXT SESSION: nustep, quad sets, heel slides, gastroc stretch, manual therapy, and modalities as needed   Darlin Coco, PT 07/07/2022, 1:11 PM

## 2022-07-11 ENCOUNTER — Ambulatory Visit: Payer: BC Managed Care – PPO

## 2022-07-11 DIAGNOSIS — M25662 Stiffness of left knee, not elsewhere classified: Secondary | ICD-10-CM

## 2022-07-11 DIAGNOSIS — M25562 Pain in left knee: Secondary | ICD-10-CM

## 2022-07-11 NOTE — Therapy (Signed)
OUTPATIENT PHYSICAL THERAPY LOWER EXTREMITY TREATMENT   Patient Name: Tony Cantu MRN: [id number] DOB:07-12-1951, 71 y.o., male Today's Date: 07/11/2022   PT End of Session - 07/11/22 0951     Visit Number 12    Number of Visits 15    Date for PT Re-Evaluation 07/22/22    PT Start Time 0939    PT Stop Time 8592    PT Time Calculation (min) 61 min    Activity Tolerance Patient tolerated treatment well    Behavior During Therapy [facility] for tasks assessed/performed                     Past Medical History:  Diagnosis Date   Aortic atherosclerosis ([place])    Aortic stenosis    Arthritis    Asthma    BPH (benign prostatic hypertrophy)    Cirrhosis ([place]) 2016   Colon polyps    Diabetes mellitus without complication ([place])    TYPE 1    Diverticulosis    Dysrhythmia 04/19/2021   A-fib noted with possible A-fib RVR   Gastric ulcer    Gastric varices    right   Gastritis    GERD (gastroesophageal reflux disease)    Headache    History- pt states these were migraines that occurred in the 1980's   Heart murmur    History of kidney stones    Hx of adenomatous colonic polyps    Hypertension    PONV (postoperative nausea and vomiting)    pt states he has never had post op nausea or vomiting   Portal hypertensive gastropathy (HCC)    Renal cyst, right    Seizures (HCC)    HYPOGLYCEMIC LAST 1 AND 1/2 YRS AGO   Sleep apnea    uses CPAP nightly   Testicle trouble    one testicle BORN WITH   Tubular adenoma of colon    Past Surgical History:  Procedure Laterality Date   BACK SURGERY     82  LOWER    CARPAL TUNNEL RELEASE Right 10/13/2015   Procedure: RIGHT CARPAL TUNNEL RELEASE;  Surgeon: Daryll Brod, MD;  Location: [place];  Service: Orthopedics;  Laterality: Right;   CARPAL TUNNEL RELEASE Left 07/05/2016   Procedure: LEFT CARPAL TUNNEL RELEASE;  Surgeon: Daryll Brod, MD;  Location: [place];  Service: Orthopedics;  Laterality:  Left;   DRUG INDUCED ENDOSCOPY N/A 07/02/2021   Procedure: DRUG INDUCED SLEEP ENDOSCOPY;  Surgeon: Melida Quitter, MD;  Location: [place];  Service: ENT;  Laterality: N/A;   IMPLANTATION OF HYPOGLOSSAL NERVE STIMULATOR Right 10/01/2021   Procedure: IMPLANTATION OF HYPOGLOSSAL NERVE STIMULATOR;  Surgeon: Melida Quitter, MD;  Location: [place];  Service: ENT;  Laterality: Right;   INGUINAL HERNIA REPAIR  2003   right    KNEE ARTHROSCOPY Right 03/03/2016   LUMBAR DISC SURGERY  03/1995   Dr. Coralyn Mark, discectomy   LUMBAR LAMINECTOMY/DECOMPRESSION MICRODISCECTOMY Right 10/06/2016   Procedure: RIGHT LUMBAR THREE - LUMBAR FOUR  LAMINECTOMY, FORAMINOTOMY AND MICRODISCECTOMY;  Surgeon: Jovita Gamma, MD;  Location: [place];  Service: Neurosurgery;  Laterality: Right;   SHOULDER SURGERY  11/28/2005   left partial   SHOULDER SURGERY  07/14/2006   RIGHT   TONSILLECTOMY  AGE 52 OR 4   TOTAL KNEE ARTHROPLASTY Right 03/07/2022   Procedure: RIGHT TOTAL KNEE ARTHROPLASTY;  Surgeon: Frederik Pear, MD;  Location: [facility];  Service: Orthopedics;  Laterality: Right;   TOTAL KNEE ARTHROPLASTY Left 06/06/2022  Procedure: LEFT TOTAL KNEE ARTHROPLASTY;  Surgeon: Frederik Pear, MD;  Location: [facility];  Service: Orthopedics;  Laterality: Left;   TOTAL SHOULDER ARTHROPLASTY Right 11/01/2018   Procedure: RIGHT SHOULDER REVISION TO REVERSE TOTAL SHOULDER;  Surgeon: Tania Ade, MD;  Location: [facility];  Service: Orthopedics;  Laterality: Right;  CHOICE ANESTHESIA WITH INTERSCALENE BLOCK EXPAREL, NEEDS RNFA   Patient Active Problem List   Diagnosis Date Noted   S/P total knee arthroplasty, left 06/06/2022   Degenerative arthritis of left knee 06/03/2022   Chronic obstructive pulmonary disease ([place]) 05/10/2022   Grade I diastolic dysfunction 84/66/5993   Postoperative fever 03/10/2022   S/P TKR (total knee replacement), right 03/07/2022   Osteoarthritis of right knee 03/04/2022   Precordial chest pain 04/20/2021   Former  smoker 01/12/2021   PAF (paroxysmal atrial fibrillation) ([place]) 06/22/2020   Hypertension associated with type 2 diabetes mellitus ([place]) 05/17/2019   Hyperlipidemia associated with type 2 diabetes mellitus ([place]) 05/17/2019   GERD with esophagitis 05/17/2019   H/O total shoulder replacement, right 11/01/2018   Paroxysmal tachycardia ([place]) 01/11/2017   Nonrheumatic aortic valve stenosis 01/11/2017   Aortic atherosclerosis ([place]) 11/23/2016   HNP (herniated nucleus pulposus), lumbar 10/06/2016   Acute renal injury ([place]) 08/06/2016   Diarrhea 08/06/2016   Fever 08/06/2016   Hyperbilirubinemia 08/06/2016   Lactic acidosis 08/06/2016   Inguinal hernia 12/10/2015   Right groin pain 11/30/2015   Carpal tunnel syndrome on right 09/09/2015   Cervical spondylosis without myelopathy 09/09/2015   Thrombocytopenia ([place]) 07/21/2014   Bilateral carotid bruits 05/11/2014   Vitamin D deficiency 09/17/2013   BPH (benign prostatic hyperplasia) 05/22/2013   Low serum testosterone level 02/18/2013   Diabetes type 2, controlled ([place]) 01/10/2013   OSA (obstructive sleep apnea) 05/16/2012   Edema 02/21/2012   At risk for coronary artery disease 03/20/2011   Obesity 03/20/2011   Allergic rhinitis 06/22/2010   ASTHMA 06/22/2010   COUGH 06/22/2010    PCP: [name], DO  REFERRING PROVIDER: Frederik Pear, MD  REFERRING DIAG: s/p Left TKA   THERAPY DIAG:  Acute pain of left knee  Stiffness of left knee, not elsewhere classified  Rationale for Evaluation and Treatment Rehabilitation  ONSET DATE: 06/06/22  SUBJECTIVE:   SUBJECTIVE STATEMENT: Patient reports that he feels alright today. He notes that he is able to "speed walk" now.   PERTINENT HISTORY: HTN, DM, and OA  PAIN:  Are you having pain? Yes: NPRS scale: 2/10 Pain location: left knee Pain description: aching Aggravating factors: moving his knee and walking Relieving factors: medication and ice  PRECAUTIONS:  None  WEIGHT BEARING RESTRICTIONS No  FALLS:  Has patient fallen in last 6 months? No  LIVING ENVIRONMENT: Lives with: lives with their spouse Lives in: House/apartment Stairs: Yes: Internal: 20-25 steps; can reach both and External: 2 steps; can reach both Has following equipment at home: Gilford Rile - 2 wheeled  OCCUPATION: retired  PLOF: Independent  PATIENT GOALS: walking without assistive device, go outside an take pictures   OBJECTIVE: all objective measures were performed at his initial evaluation on 06/10/22 unless otherwise noted  PATIENT SURVEYS:  FOTO 41.18  COGNITION:  Overall cognitive status: Within functional limits for tasks assessed     SENSATION: Patient reports no numbness and tingling  EDEMA:  Moderate LLE edema (wearing compression stockings); no signs or symptoms of DVT  PALPATION: TTP: left patella and patellar tendon  LOWER EXTREMITY ROM:  Active ROM Right eval Left eval Left 07/04/22 Left  07/11/22  Hip flexion      Hip extension      Hip abduction      Hip adduction      Hip internal rotation      Hip external rotation      Knee flexion 136 86/90 (PROM) 110/ 117 (PROM) 114/ 120 (PROM)  Knee extension 0 _0 Ankle dorsiflexion      Ankle plantarflexion      Ankle inversion      Ankle eversion       (Blank rows = not tested)  LOWER EXTREMITY MMT: not tested due to surgical condition  FUNCTIONAL TESTS:  Required therapist assistance (modA) for sit to supine and sit to stand transfer   GAIT: Assistive device utilized: Environmental consultant - 2 wheeled Level of assistance: Modified independence Comments: step to pattern, poor foot clearance bilaterally, left knee flexed in stance, minimal left knee flexion in swing phase  TODAY'S TREATMENT:                                   11/13 EXERCISE LOG  Exercise Repetitions and Resistance Comments  Nustep  L4 x 15 minutes; seat 8-7   Lunges onto step  2 minutes w/ 5 second hold    Cybex leg press 1  plate; seat 5 x 3 minutes   Recumbent bike  L1 x 5 minutes; seat 6   Cybex knee extension 20# x 3 minutes    Cybex knee flexion 50# x 3 minutes     Blank cell = exercise not performed today  Modalities  Date:  [place]: Knee, 34 degrees; low pressure, 15 mins, Pain and Edema                                   11/9 EXERCISE LOG  Exercise Repetitions and Resistance Comments  Nustep  L4 x 15 minutes; seat 9-6   Rocker board  4 minutes   Lunges onto step 14" step x 20 reps   Step up 6" step x 20 reps each    Cybex knee flexion  50# x 20 reps    Cybex knee extension 20# x 2 minutes     Blank cell = exercise not performed today  Manual Therapy Soft Tissue Mobilization: quadriceps, for improved knee mobility  Joint Mobilizations: patellar, grade I-IV Passive ROM: flexion and extension, to tolerance    Modalities  Date:  [place]aso: Knee, 34 degrees; low pressure, 10 mins, Pain and Edema                                   11/6 EXERCISE LOG  Exercise Repetitions and Resistance Comments  Nustep  L4 x 15 minutes; seat 8    Rocker board 5 minutes   Seated heel slide 2 minutes   LAQ 4 lbs x 3 minutes   Cybex leg press 1 plate; seat 7 x 20 reps   Gastroc stretch  3 x 30 seconds   Side stepping 2 minutes     Blank cell = exercise not performed today  Modalities  Date:  [place]as[place]: Knee, 34 degrees; low pressure, 15 mins, Pain and Edema  PATIENT EDUCATION:  Education details: POC, healing, prognosis, HEP Person educated: Patient Education method: Explanation Education comprehension: verbalized understanding  HOME EXERCISE PROGRAM: HEP provided by his surgeon was reviewed  ASSESSMENT:  CLINICAL IMPRESSION: Patient is making good progress with skilled physical therapy as evidenced by his subjective reports, objective measures, functional mobility, and progress towards his goals.  He was able to demonstrate improved left knee flexion since his last progress report on 07/04/22. However, he is yet  to meet his goal of 120 degrees of active knee flexion. He was progressed with the recumbent bike for improved knee mobility. He required minimal cueing with today's interventions for proper pacing to facilitate improved lower extremity strength.  He reported no significant pain or discomfort with any of today's interventions. He reported feeling about the same upon the conclusion of treatment. He continues to require skilled physical therapy to address his remaining impairments to return to his prior level of function.   OBJECTIVE IMPAIRMENTS Abnormal gait, decreased activity tolerance, decreased endurance, decreased mobility, difficulty walking, decreased ROM, decreased strength, hypomobility, increased edema, impaired flexibility, and pain.   ACTIVITY LIMITATIONS carrying, lifting, standing, squatting, sleeping, stairs, transfers, bed mobility, bathing, toileting, dressing, and locomotion level  PARTICIPATION LIMITATIONS: meal prep, cleaning, laundry, driving, shopping, and community activity  PERSONAL FACTORS 3+ comorbidities: HTN, DM, and OA  are also affecting patient's functional outcome.   REHAB POTENTIAL: Good  CLINICAL DECISION MAKING: Stable/uncomplicated  EVALUATION COMPLEXITY: Low   GOALS: Goals reviewed with patient? No  SHORT TERM GOALS: Target date: 07/01/2022  Patient will be independent with his initial HEP.  Baseline: Goal status: MET  2.  Patient will be able to demonstrate active left knee extension within 10 degrees of neutral for improved gait mechanics.  Baseline:  Goal status: MET  3.  Patient will be able to walk with a cane or the least restrictive assistive device for at least 80 feet.  Baseline:  Goal status: MET  4.  Patient will be able to complete his daily activities without his familiar knee pain exceeding 6/10.  Baseline:  Goal status: MET  LONG TERM GOALS: Target date: 07/22/2022   Patient will be independent with his advanced HEP.  Baseline:   Goal status: IN PROGRESS  2.  Patient will be able to demonstrate active left knee extension within 5 degrees of neutral for improved gait mechanics.  Baseline: 4 degrees on 07/07/22 Goal status: MET  3.  Patient will be able to demonstrate at least 120 degree of active left knee flexion for improved function navigating stairs.  Baseline: 109 degrees of AROM; 118 degrees PROM  Goal status: IN PROGRESS  4.  Patient will be able to navigate at least 4 steps with a reciprocal pattern for improved function with household navigation.  Baseline:  Goal status: IN PROGRESS  5.  Patient will be able to complete his daily activities without his familiar pain exceeding 4/10.  Baseline:  Goal status: MET  PLAN: PT FREQUENCY: 2-3x/week  PT DURATION: 6 weeks  PLANNED INTERVENTIONS: Therapeutic exercises, Therapeutic activity, Neuromuscular re-education, Balance training, Gait training, Patient/Family education, Self Care, Joint mobilization, Stair training, Electrical stimulation, Cryotherapy, Moist heat, Vasopneumatic device, Manual therapy, and Re-evaluation  PLAN FOR NEXT SESSION: nustep, quad sets, heel slides, gastroc stretch, manual therapy, and modalities as needed   Darlin Coco, PT 07/11/2022, 3:04 PM

## 2022-07-14 ENCOUNTER — Ambulatory Visit: Payer: BC Managed Care – PPO

## 2022-07-14 DIAGNOSIS — M25562 Pain in left knee: Secondary | ICD-10-CM | POA: Diagnosis not present

## 2022-07-14 DIAGNOSIS — M25662 Stiffness of left knee, not elsewhere classified: Secondary | ICD-10-CM | POA: Diagnosis not present

## 2022-07-14 NOTE — Therapy (Signed)
OUTPATIENT PHYSICAL THERAPY LOWER EXTREMITY TREATMENT   Patient Name: Tony Cantu MRN: [id number] DOB:[birth year], 71 y.o., male Today's Date: 07/14/2022   PT End of Session - 07/14/22 1449     Visit Number 13    Number of Visits 15    Date for PT Re-Evaluation 07/22/22    PT Start Time 1427    PT Stop Time 4008    PT Time Calculation (min) 63 min    Activity Tolerance Patient tolerated treatment well    Behavior During Therapy [facility] for tasks assessed/performed                      Past Medical History:  Diagnosis Date   Aortic atherosclerosis ([place])    Aortic stenosis    Arthritis    Asthma    BPH (benign prostatic hypertrophy)    Cirrhosis ([place]) 2016   Colon polyps    Diabetes mellitus without complication ([place])    TYPE 1    Diverticulosis    Dysrhythmia 04/19/2021   A-fib noted with possible A-fib RVR   Gastric ulcer    Gastric varices    right   Gastritis    GERD (gastroesophageal reflux disease)    Headache    History- pt states these were migraines that occurred in the 1980's   Heart murmur    History of kidney stones    Hx of adenomatous colonic polyps    Hypertension    PONV (postoperative nausea and vomiting)    pt states he has never had post op nausea or vomiting   Portal hypertensive gastropathy (HCC)    Renal cyst, right    Seizures (HCC)    HYPOGLYCEMIC LAST 1 AND 1/2 YRS AGO   Sleep apnea    uses CPAP nightly   Testicle trouble    one testicle BORN WITH   Tubular adenoma of colon    Past Surgical History:  Procedure Laterality Date   BACK SURGERY     76  LOWER    CARPAL TUNNEL RELEASE Right 10/13/2015   Procedure: RIGHT CARPAL TUNNEL RELEASE;  Surgeon: Daryll Brod, MD;  Location: [place];  Service: Orthopedics;  Laterality: Right;   CARPAL TUNNEL RELEASE Left 07/05/2016   Procedure: LEFT CARPAL TUNNEL RELEASE;  Surgeon: Daryll Brod, MD;  Location: [place];  Service: Orthopedics;  Laterality:  Left;   DRUG INDUCED ENDOSCOPY N/A 07/02/2021   Procedure: DRUG INDUCED SLEEP ENDOSCOPY;  Surgeon: Melida Quitter, MD;  Location: [place];  Service: ENT;  Laterality: N/A;   IMPLANTATION OF HYPOGLOSSAL NERVE STIMULATOR Right 10/01/2021   Procedure: IMPLANTATION OF HYPOGLOSSAL NERVE STIMULATOR;  Surgeon: Melida Quitter, MD;  Location: [place];  Service: ENT;  Laterality: Right;   INGUINAL HERNIA REPAIR  2003   right    KNEE ARTHROSCOPY Right 03/03/2016   LUMBAR DISC SURGERY  03/1995   Dr. Coralyn Mark, discectomy   LUMBAR LAMINECTOMY/DECOMPRESSION MICRODISCECTOMY Right 10/06/2016   Procedure: RIGHT LUMBAR THREE - LUMBAR FOUR  LAMINECTOMY, FORAMINOTOMY AND MICRODISCECTOMY;  Surgeon: Jovita Gamma, MD;  Location: [place];  Service: Neurosurgery;  Laterality: Right;   SHOULDER SURGERY  11/28/2005   left partial   SHOULDER SURGERY  07/14/2006   RIGHT   TONSILLECTOMY  AGE 41 OR 4   TOTAL KNEE ARTHROPLASTY Right 03/07/2022   Procedure: RIGHT TOTAL KNEE ARTHROPLASTY;  Surgeon: Frederik Pear, MD;  Location: [facility];  Service: Orthopedics;  Laterality: Right;   TOTAL KNEE ARTHROPLASTY Left  06/06/2022   Procedure: LEFT TOTAL KNEE ARTHROPLASTY;  Surgeon: Frederik Pear, MD;  Location: [facility];  Service: Orthopedics;  Laterality: Left;   TOTAL SHOULDER ARTHROPLASTY Right 11/01/2018   Procedure: RIGHT SHOULDER REVISION TO REVERSE TOTAL SHOULDER;  Surgeon: Tania Ade, MD;  Location: [facility];  Service: Orthopedics;  Laterality: Right;  CHOICE ANESTHESIA WITH INTERSCALENE BLOCK EXPAREL, NEEDS RNFA   Patient Active Problem List   Diagnosis Date Noted   S/P total knee arthroplasty, left 06/06/2022   Degenerative arthritis of left knee 06/03/2022   Chronic obstructive pulmonary disease ([place]) 05/10/2022   Grade I diastolic dysfunction 84/78/4128   Postoperative fever 03/10/2022   S/P TKR (total knee replacement), right 03/07/2022   Osteoarthritis of right knee 03/04/2022   Precordial chest pain 04/20/2021   Former  smoker 01/12/2021   PAF (paroxysmal atrial fibrillation) ([place]) 06/22/2020   Hypertension associated with type 2 diabetes mellitus ([place]) 05/17/2019   Hyperlipidemia associated with type 2 diabetes mellitus ([place]) 05/17/2019   GERD with esophagitis 05/17/2019   H/O total shoulder replacement, right 11/01/2018   Paroxysmal tachycardia ([place]) 01/11/2017   Nonrheumatic aortic valve stenosis 01/11/2017   Aortic atherosclerosis ([place]) 11/23/2016   HNP (herniated nucleus pulposus), lumbar 10/06/2016   Acute renal injury ([place]) 08/06/2016   Diarrhea 08/06/2016   Fever 08/06/2016   Hyperbilirubinemia 08/06/2016   Lactic acidosis 08/06/2016   Inguinal hernia 12/10/2015   Right groin pain 11/30/2015   Carpal tunnel syndrome on right 09/09/2015   Cervical spondylosis without myelopathy 09/09/2015   Thrombocytopenia ([place]) 07/21/2014   Bilateral carotid bruits 05/11/2014   Vitamin D deficiency 09/17/2013   BPH (benign prostatic hyperplasia) 05/22/2013   Low serum testosterone level 02/18/2013   Diabetes type 2, controlled ([place]) 01/10/2013   OSA (obstructive sleep apnea) 05/16/2012   Edema 02/21/2012   At risk for coronary artery disease 03/20/2011   Obesity 03/20/2011   Allergic rhinitis 06/22/2010   ASTHMA 06/22/2010   COUGH 06/22/2010    PCP: [name], DO  REFERRING PROVIDER: Frederik Pear, MD  REFERRING DIAG: s/p Left TKA   THERAPY DIAG:  Acute pain of left knee  Stiffness of left knee, not elsewhere classified  Rationale for Evaluation and Treatment Rehabilitation  ONSET DATE: 06/06/22  SUBJECTIVE:   SUBJECTIVE STATEMENT: Patient reports that his surgeon is happy with his progress and he has been released from his care.   PERTINENT HISTORY: HTN, DM, and OA  PAIN:  Are you having pain? Yes: NPRS scale: 2/10 Pain location: left knee Pain description: aching Aggravating factors: moving his knee and walking Relieving factors: medication and ice  PRECAUTIONS:  None  WEIGHT BEARING RESTRICTIONS No  FALLS:  Has patient fallen in last 6 months? No  LIVING ENVIRONMENT: Lives with: lives with their spouse Lives in: House/apartment Stairs: Yes: Internal: 20-25 steps; can reach both and External: 2 steps; can reach both Has following equipment at home: Gilford Rile - 2 wheeled  OCCUPATION: retired  PLOF: Independent  PATIENT GOALS: walking without assistive device, go outside an take pictures   OBJECTIVE: all objective measures were performed at his initial evaluation on 06/10/22 unless otherwise noted  PATIENT SURVEYS:  FOTO 41.18  COGNITION:  Overall cognitive status: Within functional limits for tasks assessed     SENSATION: Patient reports no numbness and tingling  EDEMA:  Moderate LLE edema (wearing compression stockings); no signs or symptoms of DVT  PALPATION: TTP: left patella and patellar tendon  LOWER EXTREMITY ROM:  Active ROM Right eval Left  eval Left 07/04/22 Left 07/11/22  Hip flexion      Hip extension      Hip abduction      Hip adduction      Hip internal rotation      Hip external rotation      Knee flexion 136 86/90 (PROM) 110/ 117 (PROM) 114/ 120 (PROM)  Knee extension 0 _0 Ankle dorsiflexion      Ankle plantarflexion      Ankle inversion      Ankle eversion       (Blank rows = not tested)  LOWER EXTREMITY MMT: not tested due to surgical condition  FUNCTIONAL TESTS:  Required therapist assistance (modA) for sit to supine and sit to stand transfer   GAIT: Assistive device utilized: Environmental consultant - 2 wheeled Level of assistance: Modified independence Comments: step to pattern, poor foot clearance bilaterally, left knee flexed in stance, minimal left knee flexion in swing phase  TODAY'S TREATMENT:                                   11/16 EXERCISE LOG  Exercise Repetitions and Resistance Comments  Nustep L5 x 8 minutes; seat 9-7   Step up  6" step x 22 reps    Standing HS stretch 4 x 30 seconds     Recumbent bike L3 x 6 minutes; seat 7   Cybex knee extension  20# x 3 minutes   Cybex knee flexion  50# x 3 minutes   Marching on BOSU Ball up x 1.5 minutes   Sit to stand  20 reps  Without UE support   Blank cell = exercise not performed today  Modalities  Date:  [place]: Knee, 34 degrees; low pressure, 15 mins, Pain and Edema                                   11/13 EXERCISE LOG  Exercise Repetitions and Resistance Comments  Nustep  L4 x 15 minutes; seat 8-7   Lunges onto step  2 minutes w/ 5 second hold    Cybex leg press 1 plate; seat 5 x 3 minutes   Recumbent bike  L1 x 5 minutes; seat 6   Cybex knee extension 20# x 3 minutes    Cybex knee flexion 50# x 3 minutes     Blank cell = exercise not performed today  Modalities  Date:  Vaso: Knee, 34 degrees; low pressure, 15 mins, Pain and Edema                                   11/9 EXERCISE LOG  Exercise Repetitions and Resistance Comments  Nustep  L4 x 15 minutes; seat 9-6   Rocker board  4 minutes   Lunges onto step 14" step x 20 reps   Step up 6" step x 20 reps each    Cybex knee flexion  50# x 20 reps    Cybex knee extension 20# x 2 minutes     Blank cell = exercise not performed today  Manual Therapy Soft Tissue Mobilization: quadriceps, for improved knee mobility  Joint Mobilizations: patellar, grade I-IV Passive ROM: flexion and extension, to tolerance    Modalities  Date:  [place]as[place]: Knee, 34 degrees; low pressure,  10 mins, Pain and Edema   PATIENT EDUCATION:  Education details: POC, healing, prognosis, HEP Person educated: Patient Education method: Explanation Education comprehension: verbalized understanding   HOME EXERCISE PROGRAM: HEP provided by his surgeon was reviewed  ASSESSMENT:  CLINICAL IMPRESSION: Treatment focused on familiar interventions for improved lower extremity strength and mobility. He required minimal cueing with today's interventions for proper exercise performance. He experienced no  increased pain or discomfort with any of today's interventions. He reported feeling alright upon the conclusion of treatment. He continues to require skilled physical therapy to address his remaining impairments to return to his prior level of function.   OBJECTIVE IMPAIRMENTS Abnormal gait, decreased activity tolerance, decreased endurance, decreased mobility, difficulty walking, decreased ROM, decreased strength, hypomobility, increased edema, impaired flexibility, and pain.   ACTIVITY LIMITATIONS carrying, lifting, standing, squatting, sleeping, stairs, transfers, bed mobility, bathing, toileting, dressing, and locomotion level  PARTICIPATION LIMITATIONS: meal prep, cleaning, laundry, driving, shopping, and community activity  PERSONAL FACTORS 3+ comorbidities: HTN, DM, and OA  are also affecting patient's functional outcome.   REHAB POTENTIAL: Good  CLINICAL DECISION MAKING: Stable/uncomplicated  EVALUATION COMPLEXITY: Low   GOALS: Goals reviewed with patient? No  SHORT TERM GOALS: Target date: 07/01/2022  Patient will be independent with his initial HEP.  Baseline: Goal status: MET  2.  Patient will be able to demonstrate active left knee extension within 10 degrees of neutral for improved gait mechanics.  Baseline:  Goal status: MET  3.  Patient will be able to walk with a cane or the least restrictive assistive device for at least 80 feet.  Baseline:  Goal status: MET  4.  Patient will be able to complete his daily activities without his familiar knee pain exceeding 6/10.  Baseline:  Goal status: MET  LONG TERM GOALS: Target date: 07/22/2022   Patient will be independent with his advanced HEP.  Baseline:  Goal status: IN PROGRESS  2.  Patient will be able to demonstrate active left knee extension within 5 degrees of neutral for improved gait mechanics.  Baseline: 4 degrees on 07/07/22 Goal status: MET  3.  Patient will be able to demonstrate at least 120 degree of  active left knee flexion for improved function navigating stairs.  Baseline: 109 degrees of AROM; 118 degrees PROM  Goal status: IN PROGRESS  4.  Patient will be able to navigate at least 4 steps with a reciprocal pattern for improved function with household navigation.  Baseline:  Goal status: IN PROGRESS  5.  Patient will be able to complete his daily activities without his familiar pain exceeding 4/10.  Baseline:  Goal status: MET  PLAN: PT FREQUENCY: 2-3x/week  PT DURATION: 6 weeks  PLANNED INTERVENTIONS: Therapeutic exercises, Therapeutic activity, Neuromuscular re-education, Balance training, Gait training, Patient/Family education, Self Care, Joint mobilization, Stair training, Electrical stimulation, Cryotherapy, Moist heat, Vasopneumatic device, Manual therapy, and Re-evaluation  PLAN FOR NEXT SESSION: nustep, quad sets, heel slides, gastroc stretch, manual therapy, and modalities as needed   Darlin Coco, PT 07/14/2022, 4:38 PM

## 2022-07-18 ENCOUNTER — Ambulatory Visit: Payer: BC Managed Care – PPO

## 2022-07-18 DIAGNOSIS — M25662 Stiffness of left knee, not elsewhere classified: Secondary | ICD-10-CM | POA: Diagnosis not present

## 2022-07-18 DIAGNOSIS — M25562 Pain in left knee: Secondary | ICD-10-CM

## 2022-07-18 NOTE — Therapy (Signed)
OUTPATIENT PHYSICAL THERAPY LOWER EXTREMITY TREATMENT   Patient Name: Tony Cantu MRN: [id number] DOB:04/19/1951, 71 y.o., male Today's Date: 07/18/2022   PT End of Session - 07/18/22 0906     Visit Number 14    Number of Visits 15    Date for PT Re-Evaluation 07/22/22    PT Start Time 0900    PT Stop Time 0956    PT Time Calculation (min) 56 min    Activity Tolerance Patient tolerated treatment well    Behavior During Therapy [facility] for tasks assessed/performed                      Past Medical History:  Diagnosis Date   Aortic atherosclerosis ([place])    Aortic stenosis    Arthritis    Asthma    BPH (benign prostatic hypertrophy)    Cirrhosis ([place]) 2016   Colon polyps    Diabetes mellitus without complication ([place])    TYPE 1    Diverticulosis    Dysrhythmia 04/19/2021   A-fib noted with possible A-fib RVR   Gastric ulcer    Gastric varices    right   Gastritis    GERD (gastroesophageal reflux disease)    Headache    History- pt states these were migraines that occurred in the 1980's   Heart murmur    History of kidney stones    Hx of adenomatous colonic polyps    Hypertension    PONV (postoperative nausea and vomiting)    pt states he has never had post op nausea or vomiting   Portal hypertensive gastropathy ([place])    Renal cyst, right    Seizures ([facility])    HYPOGLYCEMIC LAST 1 AND 1/2 YRS AGO   Sleep apnea    uses CPAP nightly   Testicle trouble    one testicle BORN WITH   Tubular adenoma of colon    Past Surgical History:  Procedure Laterality Date   BACK SURGERY     89  LOWER    CARPAL TUNNEL RELEASE Right 10/13/2015   Procedure: RIGHT CARPAL TUNNEL RELEASE;  Surgeon: Daryll Brod, MD;  Location: [place];  Service: Orthopedics;  Laterality: Right;   CARPAL TUNNEL RELEASE Left 07/05/2016   Procedure: LEFT CARPAL TUNNEL RELEASE;  Surgeon: Daryll Brod, MD;  Location: [place];  Service: Orthopedics;  Laterality:  Left;   DRUG INDUCED ENDOSCOPY N/A 07/02/2021   Procedure: DRUG INDUCED SLEEP ENDOSCOPY;  Surgeon: Melida Quitter, MD;  Location: [place];  Service: ENT;  Laterality: N/A;   IMPLANTATION OF HYPOGLOSSAL NERVE STIMULATOR Right 10/01/2021   Procedure: IMPLANTATION OF HYPOGLOSSAL NERVE STIMULATOR;  Surgeon: Melida Quitter, MD;  Location: [place];  Service: ENT;  Laterality: Right;   INGUINAL HERNIA REPAIR  2003   right    KNEE ARTHROSCOPY Right 03/03/2016   LUMBAR DISC SURGERY  03/1995   Dr. Coralyn Mark, discectomy   LUMBAR LAMINECTOMY/DECOMPRESSION MICRODISCECTOMY Right 10/06/2016   Procedure: RIGHT LUMBAR THREE - LUMBAR FOUR  LAMINECTOMY, FORAMINOTOMY AND MICRODISCECTOMY;  Surgeon: Jovita Gamma, MD;  Location: [place];  Service: Neurosurgery;  Laterality: Right;   SHOULDER SURGERY  11/28/2005   left partial   SHOULDER SURGERY  07/14/2006   RIGHT   TONSILLECTOMY  AGE 89 OR 4   TOTAL KNEE ARTHROPLASTY Right 03/07/2022   Procedure: RIGHT TOTAL KNEE ARTHROPLASTY;  Surgeon: Frederik Pear, MD;  Location: [facility];  Service: Orthopedics;  Laterality: Right;   TOTAL KNEE ARTHROPLASTY Left  06/06/2022   Procedure: LEFT TOTAL KNEE ARTHROPLASTY;  Surgeon: Frederik Pear, MD;  Location: [facility];  Service: Orthopedics;  Laterality: Left;   TOTAL SHOULDER ARTHROPLASTY Right 11/01/2018   Procedure: RIGHT SHOULDER REVISION TO REVERSE TOTAL SHOULDER;  Surgeon: Tania Ade, MD;  Location: [facility];  Service: Orthopedics;  Laterality: Right;  CHOICE ANESTHESIA WITH INTERSCALENE BLOCK EXPAREL, NEEDS RNFA   Patient Active Problem List   Diagnosis Date Noted   S/P total knee arthroplasty, left 06/06/2022   Degenerative arthritis of left knee 06/03/2022   Chronic obstructive pulmonary disease ([place]) 05/10/2022   Grade I diastolic dysfunction 38/46/6599   Postoperative fever 03/10/2022   S/P TKR (total knee replacement), right 03/07/2022   Osteoarthritis of right knee 03/04/2022   Precordial chest pain 04/20/2021   Former  smoker 01/12/2021   PAF (paroxysmal atrial fibrillation) ([place]) 06/22/2020   Hypertension associated with type 2 diabetes mellitus ([place]) 05/17/2019   Hyperlipidemia associated with type 2 diabetes mellitus ([place]) 05/17/2019   GERD with esophagitis 05/17/2019   H/O total shoulder replacement, right 11/01/2018   Paroxysmal tachycardia ([place]) 01/11/2017   Nonrheumatic aortic valve stenosis 01/11/2017   Aortic atherosclerosis ([place]) 11/23/2016   HNP (herniated nucleus pulposus), lumbar 10/06/2016   Acute renal injury ([place]) 08/06/2016   Diarrhea 08/06/2016   Fever 08/06/2016   Hyperbilirubinemia 08/06/2016   Lactic acidosis 08/06/2016   Inguinal hernia 12/10/2015   Right groin pain 11/30/2015   Carpal tunnel syndrome on right 09/09/2015   Cervical spondylosis without myelopathy 09/09/2015   Thrombocytopenia ([place]) 07/21/2014   Bilateral carotid bruits 05/11/2014   Vitamin D deficiency 09/17/2013   BPH (benign prostatic hyperplasia) 05/22/2013   Low serum testosterone level 02/18/2013   Diabetes type 2, controlled ([place]) 01/10/2013   OSA (obstructive sleep apnea) 05/16/2012   Edema 02/21/2012   At risk for coronary artery disease 03/20/2011   Obesity 03/20/2011   Allergic rhinitis 06/22/2010   ASTHMA 06/22/2010   COUGH 06/22/2010    PCP: [name], DO  REFERRING PROVIDER: Frederik Pear, MD  REFERRING DIAG: s/p Left TKA   THERAPY DIAG:  Acute pain of left knee  Stiffness of left knee, not elsewhere classified  Rationale for Evaluation and Treatment Rehabilitation  ONSET DATE: 06/06/22  SUBJECTIVE:   SUBJECTIVE STATEMENT: Patient reports that his knee has been aching since his last appointment. However, it is not as bad today.   PERTINENT HISTORY: HTN, DM, and OA  PAIN:  Are you having pain? Yes: NPRS scale: 2/10 Pain location: left knee Pain description: aching Aggravating factors: moving his knee and walking Relieving factors: medication and  ice  PRECAUTIONS: None  WEIGHT BEARING RESTRICTIONS No  FALLS:  Has patient fallen in last 6 months? No  LIVING ENVIRONMENT: Lives with: lives with their spouse Lives in: House/apartment Stairs: Yes: Internal: 20-25 steps; can reach both and External: 2 steps; can reach both Has following equipment at home: Gilford Rile - 2 wheeled  OCCUPATION: retired  PLOF: Independent  PATIENT GOALS: walking without assistive device, go outside an take pictures   OBJECTIVE: all objective measures were performed at his initial evaluation on 06/10/22 unless otherwise noted  PATIENT SURVEYS:  FOTO 41.18  COGNITION:  Overall cognitive status: Within functional limits for tasks assessed     SENSATION: Patient reports no numbness and tingling  EDEMA:  Moderate LLE edema (wearing compression stockings); no signs or symptoms of DVT  PALPATION: TTP: left patella and patellar tendon  LOWER EXTREMITY ROM:  Active ROM Right eval  Left eval Left 07/04/22 Left 07/11/22  Hip flexion      Hip extension      Hip abduction      Hip adduction      Hip internal rotation      Hip external rotation      Knee flexion 136 86/90 (PROM) 110/ 117 (PROM) 114/ 120 (PROM)  Knee extension 0 _0 Ankle dorsiflexion      Ankle plantarflexion      Ankle inversion      Ankle eversion       (Blank rows = not tested)  LOWER EXTREMITY MMT: not tested due to surgical condition  FUNCTIONAL TESTS:  Required therapist assistance (modA) for sit to supine and sit to stand transfer   GAIT: Assistive device utilized: Environmental consultant - 2 wheeled Level of assistance: Modified independence Comments: step to pattern, poor foot clearance bilaterally, left knee flexed in stance, minimal left knee flexion in swing phase  TODAY'S TREATMENT:                                   11/20 EXERCISE LOG  Exercise Repetitions and Resistance Comments  Nustep  L5 x 10 minutes; seat 10    Recumbent bike  L3 x 9 minutes; seat 7   Lunges  onto step  14" step x 3 minutes   Step up  6" step x 20 reps each    Cybex knee extension 30# x 30 reps   Cybex knee flexion 50# x 30 reps    Thomas stretch  4 x 30 seconds   SLR 25 reps     Blank cell = exercise not performed today                                    11/16 EXERCISE LOG  Exercise Repetitions and Resistance Comments  Nustep L5 x 8 minutes; seat 9-7   Step up  6" step x 22 reps    Standing HS stretch 4 x 30 seconds    Recumbent bike L3 x 6 minutes; seat 7   Cybex knee extension  20# x 3 minutes   Cybex knee flexion  50# x 3 minutes   Marching on BOSU Ball up x 1.5 minutes   Sit to stand  20 reps  Without UE support   Blank cell = exercise not performed today  Modalities  Date:  [place]: Knee, 34 degrees; low pressure, 15 mins, Pain and Edema                                   11/13 EXERCISE LOG  Exercise Repetitions and Resistance Comments  Nustep  L4 x 15 minutes; seat 8-7   Lunges onto step  2 minutes w/ 5 second hold    Cybex leg press 1 plate; seat 5 x 3 minutes   Recumbent bike  L1 x 5 minutes; seat 6   Cybex knee extension 20# x 3 minutes    Cybex knee flexion 50# x 3 minutes     Blank cell = exercise not performed today  Modalities  Date:  [place]as[place]: Knee, 34 degrees; low pressure, 15 mins, Pain and Edema  PATIENT EDUCATION:  Education details: POC, healing, prognosis, HEP Person educated: Patient Education method:  Explanation Education comprehension: verbalized understanding   HOME EXERCISE PROGRAM: HEP provided by his surgeon was reviewed  ASSESSMENT:  CLINICAL IMPRESSION: Patient was progressed with familiar interventions for improved knee mobility and strength with moderate difficulty. He required minimal cueing with the thomas stretch for proper positioning to facilitate improved quadriceps soft tissue extensibility. He reported no pain or discomfort with any of today's interventions. He reported that his knee felt stiff upon the conclusion of  treatment. He continues to require skilled physical therapy to address his remaining impairments to return to his prior level of function.   OBJECTIVE IMPAIRMENTS Abnormal gait, decreased activity tolerance, decreased endurance, decreased mobility, difficulty walking, decreased ROM, decreased strength, hypomobility, increased edema, impaired flexibility, and pain.   ACTIVITY LIMITATIONS carrying, lifting, standing, squatting, sleeping, stairs, transfers, bed mobility, bathing, toileting, dressing, and locomotion level  PARTICIPATION LIMITATIONS: meal prep, cleaning, laundry, driving, shopping, and community activity  PERSONAL FACTORS 3+ comorbidities: HTN, DM, and OA  are also affecting patient's functional outcome.   REHAB POTENTIAL: Good  CLINICAL DECISION MAKING: Stable/uncomplicated  EVALUATION COMPLEXITY: Low   GOALS: Goals reviewed with patient? No  SHORT TERM GOALS: Target date: 07/01/2022  Patient will be independent with his initial HEP.  Baseline: Goal status: MET  2.  Patient will be able to demonstrate active left knee extension within 10 degrees of neutral for improved gait mechanics.  Baseline:  Goal status: MET  3.  Patient will be able to walk with a cane or the least restrictive assistive device for at least 80 feet.  Baseline:  Goal status: MET  4.  Patient will be able to complete his daily activities without his familiar knee pain exceeding 6/10.  Baseline:  Goal status: MET  LONG TERM GOALS: Target date: 07/22/2022   Patient will be independent with his advanced HEP.  Baseline:  Goal status: IN PROGRESS  2.  Patient will be able to demonstrate active left knee extension within 5 degrees of neutral for improved gait mechanics.  Baseline: 4 degrees on 07/07/22 Goal status: MET  3.  Patient will be able to demonstrate at least 120 degree of active left knee flexion for improved function navigating stairs.  Baseline: 109 degrees of AROM; 118 degrees PROM   Goal status: IN PROGRESS  4.  Patient will be able to navigate at least 4 steps with a reciprocal pattern for improved function with household navigation.  Baseline:  Goal status: IN PROGRESS  5.  Patient will be able to complete his daily activities without his familiar pain exceeding 4/10.  Baseline:  Goal status: MET  PLAN: PT FREQUENCY: 2-3x/week  PT DURATION: 6 weeks  PLANNED INTERVENTIONS: Therapeutic exercises, Therapeutic activity, Neuromuscular re-education, Balance training, Gait training, Patient/Family education, Self Care, Joint mobilization, Stair training, Electrical stimulation, Cryotherapy, Moist heat, Vasopneumatic device, Manual therapy, and Re-evaluation  PLAN FOR NEXT SESSION: d/c to HEP    Darlin Coco, PT 07/18/2022, 9:59 AM

## 2022-07-20 ENCOUNTER — Other Ambulatory Visit: Payer: Self-pay | Admitting: Internal Medicine

## 2022-07-25 ENCOUNTER — Ambulatory Visit: Payer: BC Managed Care – PPO

## 2022-07-25 DIAGNOSIS — M25562 Pain in left knee: Secondary | ICD-10-CM | POA: Diagnosis not present

## 2022-07-25 DIAGNOSIS — M25662 Stiffness of left knee, not elsewhere classified: Secondary | ICD-10-CM

## 2022-07-25 NOTE — Therapy (Signed)
OUTPATIENT PHYSICAL THERAPY LOWER EXTREMITY TREATMENT   Patient Name: OSWALDO CUETO MRN: [id number] DOB:09-21-1950, 71 y.o., male Today's Date: 07/25/2022   PT End of Session - 07/25/22 0907     Visit Number 15    Number of Visits 15    Date for PT Re-Evaluation 07/22/22    PT Start Time 0900    PT Stop Time 1696    PT Time Calculation (min) 55 min    Activity Tolerance Patient tolerated treatment well    Behavior During Therapy [facility] for tasks assessed/performed                       Past Medical History:  Diagnosis Date   Aortic atherosclerosis ([place])    Aortic stenosis    Arthritis    Asthma    BPH (benign prostatic hypertrophy)    Cirrhosis ([place]) 2016   Colon polyps    Diabetes mellitus without complication ([place])    TYPE 1    Diverticulosis    Dysrhythmia 04/19/2021   A-fib noted with possible A-fib RVR   Gastric ulcer    Gastric varices    right   Gastritis    GERD (gastroesophageal reflux disease)    Headache    History- pt states these were migraines that occurred in the 1980's   Heart murmur    History of kidney stones    Hx of adenomatous colonic polyps    Hypertension    PONV (postoperative nausea and vomiting)    pt states he has never had post op nausea or vomiting   Portal hypertensive gastropathy ([place])    Renal cyst, right    Seizures (H[facility])    HYPOGLYCEMIC LAST 1 AND 1/2 YRS AGO   Sleep apnea    uses CPAP nightly   Testicle trouble    one testicle BORN WITH   Tubular adenoma of colon    Past Surgical History:  Procedure Laterality Date   BACK SURGERY     94  LOWER    CARPAL TUNNEL RELEASE Right 10/13/2015   Procedure: RIGHT CARPAL TUNNEL RELEASE;  Surgeon: Daryll Brod, MD;  Location: [place];  Service: Orthopedics;  Laterality: Right;   CARPAL TUNNEL RELEASE Left 07/05/2016   Procedure: LEFT CARPAL TUNNEL RELEASE;  Surgeon: Daryll Brod, MD;  Location: [place];  Service: Orthopedics;   Laterality: Left;   DRUG INDUCED ENDOSCOPY N/A 07/02/2021   Procedure: DRUG INDUCED SLEEP ENDOSCOPY;  Surgeon: Melida Quitter, MD;  Location: [place];  Service: ENT;  Laterality: N/A;   IMPLANTATION OF HYPOGLOSSAL NERVE STIMULATOR Right 10/01/2021   Procedure: IMPLANTATION OF HYPOGLOSSAL NERVE STIMULATOR;  Surgeon: Melida Quitter, MD;  Location: [place];  Service: ENT;  Laterality: Right;   INGUINAL HERNIA REPAIR  2003   right    KNEE ARTHROSCOPY Right 03/03/2016   LUMBAR DISC SURGERY  03/1995   Dr. Coralyn Mark, discectomy   LUMBAR LAMINECTOMY/DECOMPRESSION MICRODISCECTOMY Right 10/06/2016   Procedure: RIGHT LUMBAR THREE - LUMBAR FOUR  LAMINECTOMY, FORAMINOTOMY AND MICRODISCECTOMY;  Surgeon: Jovita Gamma, MD;  Location: [place];  Service: Neurosurgery;  Laterality: Right;   SHOULDER SURGERY  11/28/2005   left partial   SHOULDER SURGERY  07/14/2006   RIGHT   TONSILLECTOMY  AGE 34 OR 4   TOTAL KNEE ARTHROPLASTY Right 03/07/2022   Procedure: RIGHT TOTAL KNEE ARTHROPLASTY;  Surgeon: Frederik Pear, MD;  Location: [facility];  Service: Orthopedics;  Laterality: Right;   TOTAL KNEE ARTHROPLASTY  Left 06/06/2022   Procedure: LEFT TOTAL KNEE ARTHROPLASTY;  Surgeon: Frederik Pear, MD;  Location: [facility];  Service: Orthopedics;  Laterality: Left;   TOTAL SHOULDER ARTHROPLASTY Right 11/01/2018   Procedure: RIGHT SHOULDER REVISION TO REVERSE TOTAL SHOULDER;  Surgeon: Tania Ade, MD;  Location[facility];  Service: Orthopedics;  Laterality: Right;  CHOICE ANESTHESIA WITH INTERSCALENE BLOCK EXPAREL, NEEDS RNFA   Patient Active Problem List   Diagnosis Date Noted   S/P total knee arthroplasty, left 06/06/2022   Degenerative arthritis of left knee 06/03/2022   Chronic obstructive pulmonary disease ([place]) 05/10/2022   Grade I diastolic dysfunction 83/66/2947   Postoperative fever 03/10/2022   S/P TKR (total knee replacement), right 03/07/2022   Osteoarthritis of right knee 03/04/2022   Precordial chest pain 04/20/2021    Former smoker 01/12/2021   PAF (paroxysmal atrial fibrillation) ([place]) 06/22/2020   Hypertension associated with type 2 diabetes mellitus ([place]) 05/17/2019   Hyperlipidemia associated with type 2 diabetes mellitus ([place]) 05/17/2019   GERD with esophagitis 05/17/2019   H/O total shoulder replacement, right 11/01/2018   Paroxysmal tachycardia ([place]) 01/11/2017   Nonrheumatic aortic valve stenosis 01/11/2017   Aortic atherosclerosis ([place]) 11/23/2016   HNP (herniated nucleus pulposus), lumbar 10/06/2016   Acute renal injury ([place]) 08/06/2016   Diarrhea 08/06/2016   Fever 08/06/2016   Hyperbilirubinemia 08/06/2016   Lactic acidosis 08/06/2016   Inguinal hernia 12/10/2015   Right groin pain 11/30/2015   Carpal tunnel syndrome on right 09/09/2015   Cervical spondylosis without myelopathy 09/09/2015   Thrombocytopenia ([place]) 07/21/2014   Bilateral carotid bruits 05/11/2014   Vitamin D deficiency 09/17/2013   BPH (benign prostatic hyperplasia) 05/22/2013   Low serum testosterone level 02/18/2013   Diabetes type 2, controlled ([place]) 01/10/2013   OSA (obstructive sleep apnea) 05/16/2012   Edema 02/21/2012   At risk for coronary artery disease 03/20/2011   Obesity 03/20/2011   Allergic rhinitis 06/22/2010   ASTHMA 06/22/2010   COUGH 06/22/2010    PCP: [name], DO  REFERRING PROVIDER: Frederik Pear, MD  REFERRING DIAG: s/p Left TKA   THERAPY DIAG:  Acute pain of left knee  Stiffness of left knee, not elsewhere classified  Rationale for Evaluation and Treatment Rehabilitation  ONSET DATE: 06/06/22  SUBJECTIVE:   SUBJECTIVE STATEMENT: Patient reports that he feels comfortable with today being his last day of therapy. He notes that he was able to go down to his basement without any problem.   PERTINENT HISTORY: HTN, DM, and OA  PAIN:  Are you having pain? Yes: NPRS scale: 2/10 Pain location: left knee Pain description: aching Aggravating factors: moving his knee and  walking Relieving factors: medication and ice  PRECAUTIONS: None  WEIGHT BEARING RESTRICTIONS No  FALLS:  Has patient fallen in last 6 months? No  LIVING ENVIRONMENT: Lives with: lives with their spouse Lives in: House/apartment Stairs: Yes: Internal: 20-25 steps; can reach both and External: 2 steps; can reach both Has following equipment at home: Gilford Rile - 2 wheeled  OCCUPATION: retired  PLOF: Independent  PATIENT GOALS: walking without assistive device, go outside an take pictures   OBJECTIVE: all objective measures were performed at his initial evaluation on 06/10/22 unless otherwise noted  PATIENT SURVEYS:  FOTO 41.18  COGNITION:  Overall cognitive status: Within functional limits for tasks assessed     SENSATION: Patient reports no numbness and tingling  EDEMA:  Moderate LLE edema (wearing compression stockings); no signs or symptoms of DVT  PALPATION: TTP: left patella and  patellar tendon  LOWER EXTREMITY ROM:  Active ROM Right eval Left eval Left 07/04/22 Left 07/11/22  Hip flexion      Hip extension      Hip abduction      Hip adduction      Hip internal rotation      Hip external rotation      Knee flexion 136 86/90 (PROM) 110/ 117 (PROM) 114/ 120 (PROM)  Knee extension 0 _0 Ankle dorsiflexion      Ankle plantarflexion      Ankle inversion      Ankle eversion       (Blank rows = not tested)  LOWER EXTREMITY MMT: not tested due to surgical condition  FUNCTIONAL TESTS:  Required therapist assistance (modA) for sit to supine and sit to stand transfer   GAIT: Assistive device utilized: Environmental consultant - 2 wheeled Level of assistance: Modified independence Comments: step to pattern, poor foot clearance bilaterally, left knee flexed in stance, minimal left knee flexion in swing phase  TODAY'S TREATMENT:                                   11/27 EXERCISE LOG  Exercise Repetitions and Resistance Comments  Nustep  L5 x 5 minutes; seat 10   Recumbent  bike  L3 x 15 minutes; seat 7   Cybex knee extension 20# x 2.5 minutes   Cybex knee flexion 50# x 3 minutes   Rocker board 5 minutes   Squatting 15 reps    Blank cell = exercise not performed today  Modalities  Date:  Vaso: Knee, 34 degrees; low pressure, 10 mins, Pain                                   11/20 EXERCISE LOG  Exercise Repetitions and Resistance Comments  Nustep  L5 x 10 minutes; seat 10    Recumbent bike  L3 x 9 minutes; seat 7   Lunges onto step  14" step x 3 minutes   Step up  6" step x 20 reps each    Cybex knee extension 30# x 30 reps   Cybex knee flexion 50# x 30 reps    Thomas stretch  4 x 30 seconds   SLR 25 reps     Blank cell = exercise not performed today                                    11/16 EXERCISE LOG  Exercise Repetitions and Resistance Comments  Nustep L5 x 8 minutes; seat 9-7   Step up  6" step x 22 reps    Standing HS stretch 4 x 30 seconds    Recumbent bike L3 x 6 minutes; seat 7   Cybex knee extension  20# x 3 minutes   Cybex knee flexion  50# x 3 minutes   Marching on BOSU Ball up x 1.5 minutes   Sit to stand  20 reps  Without UE support   Blank cell = exercise not performed today  Modalities  Date:  Vaso: Knee, 34 degrees; low pressure, 15 mins, Pain and Edema  PATIENT EDUCATION:  Education details: POC, healing, prognosis, HEP Person educated: Patient Education method: Explanation Education comprehension: verbalized understanding  HOME EXERCISE PROGRAM: HEP provided by his surgeon was reviewed  ASSESSMENT:  CLINICAL IMPRESSION: Patient has made excellent progress with skilled physical therapy as evidenced by his subjective reports, objective measures, functional mobility, and progress towards his goals. He was able to meet all of his goals for therapy and reported feeling comfortable being discharged at this time. He reported feeling comfortable with his current HEP. He was able to complete all of today's interventions  without being limited by knee pain or discomfort.  PHYSICAL THERAPY DISCHARGE SUMMARY  Visits from Start of Care: 15  Current functional level related to goals / functional outcomes: Patient was able to meet all of his goals for skilled physical therapy.   Remaining deficits: None    Education / Equipment: HEP    Patient agrees to discharge. Patient goals were met. Patient is being discharged due to meeting the stated rehab goals.   OBJECTIVE IMPAIRMENTS Abnormal gait, decreased activity tolerance, decreased endurance, decreased mobility, difficulty walking, decreased ROM, decreased strength, hypomobility, increased edema, impaired flexibility, and pain.   ACTIVITY LIMITATIONS carrying, lifting, standing, squatting, sleeping, stairs, transfers, bed mobility, bathing, toileting, dressing, and locomotion level  PARTICIPATION LIMITATIONS: meal prep, cleaning, laundry, driving, shopping, and community activity  PERSONAL FACTORS 3+ comorbidities: HTN, DM, and OA  are also affecting patient's functional outcome.   REHAB POTENTIAL: Good  CLINICAL DECISION MAKING: Stable/uncomplicated  EVALUATION COMPLEXITY: Low   GOALS: Goals reviewed with patient? No  SHORT TERM GOALS: Target date: 07/01/2022  Patient will be independent with his initial HEP.  Baseline: Goal status: MET  2.  Patient will be able to demonstrate active left knee extension within 10 degrees of neutral for improved gait mechanics.  Baseline:  Goal status: MET  3.  Patient will be able to walk with a cane or the least restrictive assistive device for at least 80 feet.  Baseline:  Goal status: MET  4.  Patient will be able to complete his daily activities without his familiar knee pain exceeding 6/10.  Baseline:  Goal status: MET  LONG TERM GOALS: Target date: 07/22/2022   Patient will be independent with his advanced HEP.  Baseline:  Goal status: MET  2.  Patient will be able to demonstrate active left  knee extension within 5 degrees of neutral for improved gait mechanics.  Baseline: 4 degrees on 07/07/22 Goal status: MET  3.  Patient will be able to demonstrate at least 120 degree of active left knee flexion for improved function navigating stairs.  Baseline: 120 degrees AROM on 07/25/22 Goal status: MET  4.  Patient will be able to navigate at least 4 steps with a reciprocal pattern for improved function with household navigation.  Baseline:  Goal status: MET  5.  Patient will be able to complete his daily activities without his familiar pain exceeding 4/10.  Baseline:  Goal status: MET  PLAN: PT FREQUENCY: 2-3x/week  PT DURATION: 6 weeks  PLANNED INTERVENTIONS: Therapeutic exercises, Therapeutic activity, Neuromuscular re-education, Balance training, Gait training, Patient/Family education, Self Care, Joint mobilization, Stair training, Electrical stimulation, Cryotherapy, Moist heat, Vasopneumatic device, Manual therapy, and Re-evaluation  PLAN FOR NEXT SESSION: d/c to [place], PT 07/25/2022, 3:02 PM

## 2022-08-03 ENCOUNTER — Telehealth: Payer: Self-pay

## 2022-08-03 ENCOUNTER — Ambulatory Visit (INDEPENDENT_AMBULATORY_CARE_PROVIDER_SITE_OTHER): Payer: BC Managed Care – PPO | Admitting: Internal Medicine

## 2022-08-03 ENCOUNTER — Encounter: Payer: Self-pay | Admitting: Internal Medicine

## 2022-08-03 ENCOUNTER — Other Ambulatory Visit: Payer: Self-pay

## 2022-08-03 ENCOUNTER — Other Ambulatory Visit (INDEPENDENT_AMBULATORY_CARE_PROVIDER_SITE_OTHER): Payer: BC Managed Care – PPO

## 2022-08-03 VITALS — BP 120/72 | HR 83 | Ht 68.0 in | Wt 223.0 lb

## 2022-08-03 DIAGNOSIS — K7460 Unspecified cirrhosis of liver: Secondary | ICD-10-CM | POA: Diagnosis not present

## 2022-08-03 DIAGNOSIS — Z860100 Personal history of colon polyps, unspecified: Secondary | ICD-10-CM

## 2022-08-03 DIAGNOSIS — K766 Portal hypertension: Secondary | ICD-10-CM | POA: Diagnosis not present

## 2022-08-03 DIAGNOSIS — E876 Hypokalemia: Secondary | ICD-10-CM

## 2022-08-03 DIAGNOSIS — Z7901 Long term (current) use of anticoagulants: Secondary | ICD-10-CM

## 2022-08-03 LAB — COMPREHENSIVE METABOLIC PANEL
ALT: 32 U/L (ref 0–53)
AST: 38 U/L — ABNORMAL HIGH (ref 0–37)
Albumin: 3.4 g/dL — ABNORMAL LOW (ref 3.5–5.2)
Alkaline Phosphatase: 97 U/L (ref 39–117)
BUN: 9 mg/dL (ref 6–23)
CO2: 30 mEq/L (ref 19–32)
Calcium: 9.7 mg/dL (ref 8.4–10.5)
Chloride: 106 mEq/L (ref 96–112)
Creatinine, Ser: 0.97 mg/dL (ref 0.40–1.50)
GFR: 77.82 mL/min (ref >60.00)
Glucose, Bld: 131 mg/dL — ABNORMAL HIGH (ref 70–99)
Potassium: 3.1 mEq/L — ABNORMAL LOW (ref 3.5–5.1)
Sodium: 143 mEq/L (ref 135–145)
Total Bilirubin: 1.1 mg/dL (ref 0.2–1.2)
Total Protein: 7.2 g/dL (ref 6.0–8.3)

## 2022-08-03 LAB — CBC WITH DIFFERENTIAL/PLATELET
Basophils Absolute: 0.1 10*3/uL (ref 0.0–0.1)
Basophils Relative: 1.2 % (ref 0.0–3.0)
Eosinophils Absolute: 0.1 10*3/uL (ref 0.0–0.7)
Eosinophils Relative: 1.8 % (ref 0.0–5.0)
HCT: 44.9 % (ref 39.0–52.0)
Hemoglobin: 15.4 g/dL (ref 13.0–17.0)
Lymphocytes Relative: 19.5 % (ref 12.0–46.0)
Lymphs Abs: 1.3 10*3/uL (ref 0.7–4.0)
MCHC: 34.2 g/dL (ref 30.0–36.0)
MCV: 103.6 fl — ABNORMAL HIGH (ref 78.0–100.0)
Monocytes Absolute: 1.2 10*3/uL — ABNORMAL HIGH (ref 0.1–1.0)
Monocytes Relative: 17.8 % — ABNORMAL HIGH (ref 3.0–12.0)
Neutro Abs: 4.1 10*3/uL (ref 1.4–7.7)
Neutrophils Relative %: 59.7 % (ref 43.0–77.0)
Platelets: 115 10*3/uL — ABNORMAL LOW (ref 150.0–400.0)
RBC: 4.33 Mil/uL (ref 4.22–5.81)
RDW: 16.7 % — ABNORMAL HIGH (ref 11.5–15.5)
WBC: 6.9 10*3/uL (ref 4.0–10.5)

## 2022-08-03 LAB — PROTIME-INR
INR: 1.4 ratio — ABNORMAL HIGH (ref 0.8–1.0)
Prothrombin Time: 15.5 s — ABNORMAL HIGH (ref 9.6–13.1)

## 2022-08-03 MED ORDER — POTASSIUM CHLORIDE CRYS ER 20 MEQ PO TBCR: 40.0000 meq | EXTENDED_RELEASE_TABLET | Freq: Two times a day (BID) | ORAL | 0 refills | Status: DC

## 2022-08-03 MED ORDER — NA SULFATE-K SULFATE-MG SULF 17.5-3.13-1.6 GM/177ML PO SOLN: 1.0000 | ORAL | 0 refills | Status: DC

## 2022-08-03 NOTE — Patient Instructions (Addendum)
If you are age 72 or older, your body mass index should be between 23-30. Your Body mass index is 33.91 kg/m. If this is out of the aforementioned range listed, please consider follow up with your Primary Care Provider. ________________________________________________________  The [place] [facility] providers would like to encourage you to use [facility] to communicate with providers for non-urgent requests or questions.  Due to long hold times on the telephone, sending your provider a message by [facility] may be a faster and more efficient way to get a response.  Please allow 48 business hours for a response.  Please remember that this is for non-urgent requests.  _______________________________________________________  Your provider has requested that you go to the basement level for lab work before leaving today. Press "B" on the elevator. The lab is located at the first door on the left as you exit the elevator.  You have been scheduled for an abdominal ultrasound at [facility] (1st floor of hospital) on 08-12-22 at 9:30am. Please arrive 15 minutes prior to your appointment for registration. Make certain not to have anything to eat or drink after midnight the night prior to your appointment. Should you need to reschedule your appointment, please contact radiology at [phone]. This test typically takes about 30 minutes to perform.  You have been scheduled for an endoscopy and colonoscopy. Please follow the written instructions given to you at your visit today. Please pick up your prep supplies at the pharmacy within the next 1-3 days. If you use inhalers (even only as needed), please bring them with you on the day of your procedure.  Due to recent changes in healthcare laws, you may see the results of your imaging and laboratory studies on MyChart before your provider has had a chance to review them.  We understand that in some cases there may be results that are confusing or concerning to you.  Not all laboratory results come back in the same time frame and the provider may be waiting for multiple results in order to interpret others.  Please give [place] 48 hours in order for your provider to thoroughly review all the results before contacting the office for clarification of your results.   Thank you for entrusting me with your care and choosing [facility].  Dr Hilarie Fredrickson

## 2022-08-03 NOTE — Progress Notes (Signed)
Subjective:    Patient ID: Tony Cantu, male    DOB: 12/23/1950, 71 y.o.   MRN: [id number]  HPI Tony Cantu is a 71 year old male with a history of NAFLD cirrhosis with portal hypertension, history of colonic polyps, history of C. difficile colitis, atrial fibrillation and aortic stenosis on Eliquis, hypertension, diabetes sleep apnea with inspire device in place who is here for follow-up.  He is here alone today and I last saw him in May 2023.  He reports that he has been doing fairly well.  He did undergo successful bilateral total knee replacements in June and October of this year.  About 2 weeks ago he eliminated all of the narcotic pain medication.  While he was using this he had intermittent hard stool and hemorrhoidal prolapse but no rectal bleeding.  No recent blood in stool or melena.  No trouble with abdominal pain, increasing abdominal girth or lower extremity edema.  No nausea or vomiting.  No issues with confusion or sleepiness.  He is down about 8 pounds   He continues pantoprazole 40 mg twice daily.  He continues on Eliquis.    Review of Systems As per HPI, otherwise negative  Current Medications, Allergies, Past Medical History, Past Surgical History, Family History and Social History were reviewed in [employer] record.    Objective:   Physical Exam BP 120/72   Pulse 83   Ht 5' 8"  (1.727 m)   Wt 223 lb (101.2 kg)   BMI 33.91 kg/m  Gen: awake, alert, NAD HEENT: anicteric  CV: RRR, 2/6 sem Pulm: CTA b/l Abd: soft, NT/ND, +BS throughout Ext: no c/c/e Neuro: nonfocal, no asterixis  Lab Results  Component Value Date   INR 1.1 (H) 05/27/2021   INR 1.1 02/27/2019   INR 1.0 10/25/2018      Latest Ref Rng & Units 07/05/2022    9:09 AM 05/24/2022    9:34 AM 03/12/2022    3:23 AM  CBC  WBC 4.0 - 10.5 K/uL 5.5  6.8  6.9   Hemoglobin 13.0 - 17.0 g/dL 15.0  15.9  13.6   Hematocrit 39.0 - 52.0 % 45.3  48.5  40.0   Platelets 150 - 400 K/uL 113   126  112    CMP     Component Value Date/Time   NA 141 05/24/2022 0934   NA 140 10/20/2021 1245   K 3.8 05/24/2022 0934   CL 110 05/24/2022 0934   CO2 26 05/24/2022 0934   GLUCOSE 151 (H) 05/24/2022 0934   BUN 10 05/24/2022 0934   BUN 11 10/20/2021 1245   CREATININE 0.88 05/24/2022 0934   CREATININE 0.82 01/07/2013 0840   CALCIUM 9.3 05/24/2022 0934   PROT 7.2 05/24/2022 0934   PROT 6.6 10/20/2021 1245   ALBUMIN 3.2 (L) 05/24/2022 0934   ALBUMIN 3.7 10/20/2021 1245   AST 37 05/24/2022 0934   ALT 29 05/24/2022 0934   ALKPHOS 82 05/24/2022 0934   BILITOT 1.2 05/24/2022 0934   BILITOT 0.8 10/20/2021 1245   GFRNONAA >60 05/24/2022 0934   GFRNONAA >89 01/07/2013 0840   GFRAA 100 09/07/2020 0920   GFRAA >89 01/07/2013 0840          Assessment & Plan:   71 year old male with a history of NAFLD cirrhosis with portal hypertension, history of colonic polyps, history of C. difficile colitis, atrial fibrillation and aortic stenosis on Eliquis, hypertension, diabetes sleep apnea with inspire device in place who is here for  follow-up.    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  He tolerated to surgeries in the last 6 months without decompensation.  We need to update HCC screening. -- Abdominal ultrasound for [place] screening -- Repeat upper endoscopy given upcoming colonoscopy, heme positive stool earlier this year and history of portal hypertension; we reviewed the risk, benefits of upper endoscopy and colonoscopy and he is agreeable and wishes to proceed -- No evidence for ascites or encephalopathy -- He does not drink alcohol  2.  History of gastric ulcer --H. pylori negative, continue twice daily PPI -- Pantoprazole 40 mg twice daily AC -- NSAID avoidance  3.  History of colon polyps --colonoscopy due at this time.  We reviewed the risk, benefits and alternatives and he is agreeable and wishes to proceed --Colonoscopy  4.  Chronic  anticoagulation for atrial fibrillation -- Will hold Eliquis  2 days prior to endoscopic procedures - will instruct when and how to resume after procedure. Benefits and risks of procedure explained including risks of bleeding, perforation, infection, missed lesions, reactions to medications and possible need for hospitalization and surgery for complications. Additional rare but real risk of stroke or other vascular clotting events off Eliquis also explained and need to seek urgent help if any signs of these problems occur. Will communicate by phone or EMR with patient's  prescribing provider to confirm that holding Eliqus is reasonable in this case.  -- We will reach out to Dr. Percival Spanish to ensure holding Eliquis x 48 hours is acceptable prior to procedures  30 minutes total spent today including patient facing time, coordination of care, reviewing medical history/procedures/pertinent radiology studies, and documentation of the encounter.

## 2022-08-03 NOTE — Telephone Encounter (Signed)
   Patient Name: Tony Cantu  DOB: 1950-10-23 MRN: [id number]  Primary Cardiologist: Minus Breeding, MD  Chart reviewed as part of pre-operative protocol coverage. Called patient to discuss pharmacy recommendations.  No answer.  Left message for patient to return call at earliest convenience.   Lenna Sciara, NP 08/03/2022, 11:36 AM

## 2022-08-03 NOTE — Telephone Encounter (Signed)
Patient with diagnosis of afib on Eliquis for anticoagulation.    Procedure: endoscopy/colonoscopy Date of procedure: 10/05/22  CHA2DS2-VASc Score = 4  This indicates a 4.8% annual risk of stroke. The patient's score is based upon: CHF History: 0 HTN History: 1 Diabetes History: 1 Stroke History: 0 Vascular Disease History: 1 Age Score: 1 Gender Score: 0  CrCl 44m/min using adjusted body weight Platelet count 113K  Per office protocol, patient can hold Eliquis for 2 days prior to procedure as requested. Please clarify pt's Eliquis dose, he has 2.52mBID on his med list but listed as pt taking 71m34mID. He should be on 71mg22mD, likely needs new rx sent in.  **This guidance is not considered finalized until pre-operative APP has relayed final recommendations.**

## 2022-08-03 NOTE — Telephone Encounter (Signed)
[facility] Pre-operative Risk Assessment     Request for surgical clearance:     Endoscopy Procedure  What type of surgery is being performed?     Endo/Colon  When is this surgery scheduled?     10-05-22  What type of clearance is required ?   Pharmacy  Are there any medications that need to be held prior to surgery and how long? Eliquis x 2 days  Practice name and name of physician performing surgery?      [facility]  What is your office phone and fax number?      Phone- [phone]  [phone]  Anesthesia type (None, local, MAC, general) ?       MAC

## 2022-08-04 DIAGNOSIS — N182 Chronic kidney disease, stage 2 (mild): Secondary | ICD-10-CM | POA: Diagnosis not present

## 2022-08-04 DIAGNOSIS — E1122 Type 2 diabetes mellitus with diabetic chronic kidney disease: Secondary | ICD-10-CM | POA: Diagnosis not present

## 2022-08-04 DIAGNOSIS — Z794 Long term (current) use of insulin: Secondary | ICD-10-CM | POA: Diagnosis not present

## 2022-08-08 MED ORDER — APIXABAN 5 MG PO TABS: 5.0000 mg | ORAL_TABLET | Freq: Two times a day (BID) | ORAL | 3 refills | Status: DC

## 2022-08-08 NOTE — Telephone Encounter (Signed)
   Patient Name: Tony Cantu  DOB: [birth year] MRN: [id number]  Primary Cardiologist: Minus Breeding, MD  Chart reviewed as part of pre-operative protocol coverage. Pharmacy clearance only. Per office protocols and pharmacist review, Tony Cantu may hold Eliquis 2 days prior to planned procedure.   Pt confirms taking Eliquis 59m BID. Gets through mail order - refill provided. Med list updated. He verbalizes understanding to hold Eliquis 2 days prior to colonoscopy.   I will route this recommendation to the requesting party via Epic fax function and remove from pre-op pool.  Please call with questions.  CLoel Dubonnet NP 08/08/2022, 11:45 AM

## 2022-08-08 NOTE — Telephone Encounter (Signed)
Called patient pharmacy, [facility], he last picked up Eliquis 2.66m which was picked up for 15 day supply on 06/06/22.  Called patient, left VM requesting call back.   When he calls back will need to ensure he is taking Eliquis and update dose.  CLoel Dubonnet NP

## 2022-08-08 NOTE — Addendum Note (Signed)
Addended by: Loel Dubonnet on: 08/08/2022 11:46 AM   Modules accepted: Orders

## 2022-08-08 NOTE — Telephone Encounter (Signed)
Patient returned call. Laurann Montana, NP unable to take call, call transferred to RN. Patient confirms that he is taking Eliquis 43m twice daily. RX list updated.

## 2022-08-08 NOTE — Addendum Note (Signed)
Addended by: Gerald Stabs on: 08/08/2022 11:26 AM   Modules accepted: Orders

## 2022-08-09 NOTE — Telephone Encounter (Signed)
Patient advised that he has been given clearance to hold Eliquis 2 days prior to endo/colon scheduled for 10-05-22.  Patient advised to take last dose of Eliquis on 10-02-22, and he will be advised when to restart Eliquis by Dr Hilarie Fredrickson after the procedure.  Patient agreed to plan and verbalized understanding.  No further questions.

## 2022-08-11 ENCOUNTER — Other Ambulatory Visit: Payer: Medicare Other

## 2022-08-11 ENCOUNTER — Other Ambulatory Visit: Payer: Self-pay

## 2022-08-11 DIAGNOSIS — E162 Hypoglycemia, unspecified: Secondary | ICD-10-CM

## 2022-08-11 DIAGNOSIS — E876 Hypokalemia: Secondary | ICD-10-CM

## 2022-08-12 ENCOUNTER — Ambulatory Visit (HOSPITAL_COMMUNITY)
Admission: RE | Admit: 2022-08-12 | Discharge: 2022-08-12 | Disposition: A | Discharge status: Home / Self Care | Payer: BC Managed Care – PPO | Source: Ambulatory Visit | Attending: Internal Medicine | Admitting: Internal Medicine

## 2022-08-12 DIAGNOSIS — N200 Calculus of kidney: Secondary | ICD-10-CM | POA: Diagnosis not present

## 2022-08-12 DIAGNOSIS — Z86010 Personal history of colonic polyps: Secondary | ICD-10-CM | POA: Diagnosis not present

## 2022-08-12 DIAGNOSIS — K7460 Unspecified cirrhosis of liver: Secondary | ICD-10-CM | POA: Diagnosis not present

## 2022-08-12 LAB — BMP8+EGFR
BUN/Creatinine Ratio: 11 (ref 10–24)
BUN: 10 mg/dL (ref 8–27)
CO2: 21 mmol/L (ref 20–29)
Calcium: 9.3 mg/dL (ref 8.6–10.2)
Chloride: 104 mmol/L (ref 96–106)
Creatinine, Ser: 0.95 mg/dL (ref 0.76–1.27)
Glucose: 156 mg/dL — ABNORMAL HIGH (ref 70–99)
Potassium: 3.5 mmol/L (ref 3.5–5.2)
Sodium: 141 mmol/L (ref 134–144)
eGFR: 85 mL/min/{1.73_m2} (ref >59)

## 2022-08-12 LAB — MAGNESIUM: Magnesium: 1.8 mg/dL (ref 1.6–2.3)

## 2022-08-16 DIAGNOSIS — N182 Chronic kidney disease, stage 2 (mild): Secondary | ICD-10-CM | POA: Diagnosis not present

## 2022-08-16 DIAGNOSIS — Z794 Long term (current) use of insulin: Secondary | ICD-10-CM | POA: Diagnosis not present

## 2022-08-16 DIAGNOSIS — E1122 Type 2 diabetes mellitus with diabetic chronic kidney disease: Secondary | ICD-10-CM | POA: Diagnosis not present

## 2022-08-24 ENCOUNTER — Encounter: Payer: Self-pay | Admitting: Family Medicine

## 2022-08-24 ENCOUNTER — Ambulatory Visit (INDEPENDENT_AMBULATORY_CARE_PROVIDER_SITE_OTHER): Payer: BC Managed Care – PPO | Admitting: Family Medicine

## 2022-08-24 VITALS — BP 110/54 | HR 58 | Temp 98.1°F | Ht 68.0 in | Wt 218.6 lb

## 2022-08-24 DIAGNOSIS — K766 Portal hypertension: Secondary | ICD-10-CM

## 2022-08-24 DIAGNOSIS — Z794 Long term (current) use of insulin: Secondary | ICD-10-CM

## 2022-08-24 DIAGNOSIS — I152 Hypertension secondary to endocrine disorders: Secondary | ICD-10-CM | POA: Diagnosis not present

## 2022-08-24 DIAGNOSIS — K760 Fatty (change of) liver, not elsewhere classified: Secondary | ICD-10-CM

## 2022-08-24 DIAGNOSIS — E1122 Type 2 diabetes mellitus with diabetic chronic kidney disease: Secondary | ICD-10-CM

## 2022-08-24 DIAGNOSIS — E785 Hyperlipidemia, unspecified: Secondary | ICD-10-CM

## 2022-08-24 DIAGNOSIS — E1169 Type 2 diabetes mellitus with other specified complication: Secondary | ICD-10-CM

## 2022-08-24 DIAGNOSIS — N182 Chronic kidney disease, stage 2 (mild): Secondary | ICD-10-CM

## 2022-08-24 DIAGNOSIS — E1159 Type 2 diabetes mellitus with other circulatory complications: Secondary | ICD-10-CM

## 2022-08-24 LAB — BAYER DCA HB A1C WAIVED: HB A1C (BAYER DCA - WAIVED): 5.1 % (ref 4.8–5.6)

## 2022-08-24 NOTE — Progress Notes (Signed)
Subjective: CC:DM PCP: [name], DO TIR:[id number] Tony Cantu is a 71 y.o. male presenting to clinic today for:  1. Type 2 Diabetes with hypertension, hyperlipidemia associated with NAFLD Tony/ cirrhosis:  Patient continues to use the freestyle libre for regular blood sugar checks.  He has had several lows and this seems to be related to Humalog use.  He is currently injecting 54 units of Toujeo daily and anywhere from 25 to 28 units of Humalog with meals.  He recently saw his hepatologist and had follow-up on his liver.  Last eye exam: DUE Last foot exam: DUE Last A1c:  Lab Results  Component Value Date   HGBA1C 4.8 05/24/2022   Nephropathy screen indicated?: DUE Last flu, zoster and/or pneumovax:  Immunization History  Administered Date(s) Administered   Fluad Quad(high Dose 65+) 05/17/2019, 05/19/2020, 07/06/2021   Influenza Split 06/05/2013   Influenza Whole 06/15/2010, 06/16/2011, 05/29/2012   Influenza, High Dose Seasonal PF 06/25/2015, 06/25/2015, 06/12/2017   Influenza,inj,Quad PF,6+ Mos 06/05/2013, 06/17/2014, 06/07/2016, 05/31/2022   Influenza-Unspecified 07/04/2008, 06/17/2014, 06/25/2015, 06/12/2017, 05/22/2018   Moderna SARS-COV2 Booster Vaccination 08/26/2020   Moderna Sars-Covid-2 Vaccination 11/07/2019, 12/05/2019   Pneumococcal Conjugate-13 06/05/2013   Pneumococcal Polysaccharide-23 06/03/2011, 07/04/2011, 09/16/2019   Tdap 08/30/2007, 12/27/2017   Zoster Recombinat (Shingrix) 01/05/2022, 04/13/2022   Zoster, Live 02/01/2012    ROS: No chest pain, shortness of breath or visual disturbance reported    ROS: Per HPI  Allergies  Allergen Reactions   Dimetapp Children's Cold-Cough     Chest discomfort    Erythromycin Diarrhea   Sulfonamide Derivatives Diarrhea   Past Medical History:  Diagnosis Date   Aortic atherosclerosis (HCC)    Aortic stenosis    Arthritis    Asthma    BPH (benign prostatic hypertrophy)    Cirrhosis ([place]) 2016   Colon  polyps    Diabetes mellitus without complication ([place])    TYPE 1    Diverticulosis    Dysrhythmia 04/19/2021   A-fib noted with possible A-fib RVR   Gastric ulcer    Gastric varices    right   Gastritis    GERD (gastroesophageal reflux disease)    Headache    History- pt states these were migraines that occurred in the 1980's   Heart murmur    History of kidney stones    Hx of adenomatous colonic polyps    Hypertension    PONV (postoperative nausea and vomiting)    pt states he has never had post op nausea or vomiting   Portal hypertensive gastropathy (HCC)    Renal cyst, right    Seizures (HCC)    HYPOGLYCEMIC LAST 1 AND 1/2 YRS AGO   Sleep apnea    uses CPAP nightly   Testicle trouble    one testicle BORN WITH   Tubular adenoma of colon     Current Outpatient Medications:    acetaminophen (TYLENOL) 500 MG tablet, Take 1,000 mg by mouth every 6 (six) hours as needed for moderate pain., Disp: , Rfl:    apixaban (ELIQUIS) 5 MG TABS tablet, Take 1 tablet (5 mg total) by mouth 2 (two) times daily., Disp: 180 tablet, Rfl: 3   atorvastatin (LIPITOR) 40 MG tablet, TAKE 1 TABLET BY MOUTH DAILY (Patient taking differently: Take 40 mg by mouth at bedtime.), Disp: 90 tablet, Rfl: 3   b complex vitamins tablet, Take 1 tablet by mouth daily., Disp: , Rfl:    Blood Glucose Monitoring Suppl (CONTOUR NEXT ONE) KIT, Test  BS QID and as needed Dx E11.9, Disp: 500 kit, Rfl: 3   Cholecalciferol (DIALYVITE VITAMIN D 5000) 125 MCG (5000 UT) capsule, Take 5,000 Units by mouth daily., Disp: , Rfl:    Chromium Picolinate (CHROMIUM PICOLATE PO), Take 1 tablet by mouth daily., Disp: , Rfl:    CINNAMON PO, Take 1,080 mg by mouth daily., Disp: , Rfl:    Coenzyme Q10 (COQ10) 100 MG CAPS, Take 100 mg by mouth daily., Disp: , Rfl:    Continuous Blood Gluc Receiver (FREESTYLE LIBRE READER) DEVI, 1 applicator by Does not apply route as directed., Disp: 1 Device, Rfl: 1   Continuous Blood Gluc Sensor  ([place]) MISC, Check BS eight (8) times a day. Dx E10.9, Disp: 7 each, Rfl: 3   diltiazem (CARDIZEM) 30 MG tablet, TAKE 1 TABLET BY MOUTH  TWICE DAILY, Disp: 180 tablet, Rfl: 3   furosemide (LASIX) 20 MG tablet, TAKE 1 AND 1/2 TABLETS BY MOUTH  DAILY (Patient taking differently: Take 20 mg by mouth daily.), Disp: 135 tablet, Rfl: 3   Glucagon, rDNA, (GLUCAGON EMERGENCY) 1 MG KIT, INJECT AS DIRECTED INTO  UPPER ARM, THIGH OR  BUTTOCKS AS NEEDED FOR  SEVERE HYPOGLYCEMIA. SEEK  MEDICAL ATTENTION AFTER USE (Patient taking differently: Inject 1 mg into the muscle as needed (HYPOGLYCEMIA).), Disp: 3 kit, Rfl: 0   Glucosamine-Chondroit-Vit C-Mn (GLUCOSAMINE 1500 COMPLEX) CAPS, Take 2 capsules by mouth daily., Disp: , Rfl:    glucose blood (CONTOUR NEXT TEST) test strip, Test BS QID and as needed Dx E11.9, Disp: 400 strip, Rfl: 3   insulin lispro (HUMALOG) 100 UNIT/ML injection, INJECT SUBCUTANEOUSLY 20 TO 50  UNITS 3 TIMES DAILY BEFORE MEALS PER SLIDING SCALE (Patient taking differently: Inject 10-30 Units into the skin See admin instructions. Inject 10-30 units up to 7 times daily as needed for high blood sugar), Disp: 140 mL, Rfl: 3   Magnesium 250 MG TABS, Take 250 mg by mouth daily., Disp: , Rfl:    METAMUCIL FIBER PO, Take 1 capsule by mouth 2 (two) times daily., Disp: , Rfl:    Microlet Lancets MISC, Test BS QID and as needed Dx E11.9, Disp: 500 each, Rfl: 3   montelukast (SINGULAIR) 10 MG tablet, TAKE 1 TABLET BY MOUTH AT  BEDTIME, Disp: 90 tablet, Rfl: 3   Multiple Vitamins-Minerals (EYE HEALTH) CAPS, Take 1 capsule by mouth daily., Disp: , Rfl:    Na Sulfate-K Sulfate-Mg Sulf (SUPREP BOWEL PREP KIT) 17.5-3.13-1.6 GM/177ML SOLN, Take 1 kit by mouth as directed., Disp: 324 mL, Rfl: 0   Oxycodone HCl 10 MG TABS, SMARTSIG:0.5 Tablet(s) By Mouth Every 4 Hours PRN, Disp: , Rfl:    pantoprazole (PROTONIX) 40 MG tablet, TAKE 1 TABLET BY MOUTH TWICE  DAILY BEFORE MEALS, Disp: 180  tablet, Rfl: 3   potassium chloride SA (KLOR-CON M) 20 MEQ tablet, Take 2 tablets (40 mEq total) by mouth 2 (two) times daily., Disp: 10 tablet, Rfl: 0   tiZANidine (ZANAFLEX) 2 MG tablet, Take 2 mg by mouth every 8 (eight) hours as needed for muscle spasms., Disp: , Rfl:    TOUJEO MAX SOLOSTAR 300 UNIT/ML Solostar Pen, INJECT SUBCUTANEOUSLY 110 UNITS  DAILY (Patient taking differently: Inject 70 Units into the skin at bedtime.), Disp: 36 mL, Rfl: 3   Turmeric Curcumin 500 MG CAPS, Take 500 mg by mouth daily., Disp: , Rfl:  Social History   Socioeconomic History   Marital status: Married    Spouse name: Not on  file   Number of children: 1   Years of education: Not on file   Highest education level: Not on file  Occupational History   Occupation: retired   Tobacco Use   Smoking status: Former    Years: 34.00    Types: Cigarettes, Pipe    Quit date: 2005    Years since quitting: 18.9   Smokeless tobacco: Never   Tobacco comments:    quit 2005 smoked cigarettes for 5 yrs prior to pipe use  Vaping Use   Vaping Use: Never used  Substance and Sexual Activity   Alcohol use: Not Currently   Drug use: No   Sexual activity: Yes  Other Topics Concern   Not on file  Social History Narrative   ** Merged History Encounter **    Right handed    Social Determinants of Health   Financial Resource Strain: Low Risk  (06/09/2022)   Overall Financial Resource Strain (CARDIA)    Difficulty of Paying Living Expenses: Not hard at all  Food Insecurity: No Food Insecurity (06/06/2022)   Hunger Vital Sign    Worried About Running Out of Food in the Last Year: Never true    [place] in the Last Year: Never true  Transportation Needs: No Transportation Needs (06/09/2022)   PRAPARE - Hydrologist (Medical): No    Lack of Transportation (Non-Medical): No  Physical Activity: Not on file  Stress: Not on file  Social Connections: Not on file  Intimate Partner  Violence: Not At Risk (06/06/2022)   Humiliation, Afraid, Rape, and Kick questionnaire    Fear of Current or Ex-Partner: No    Emotionally Abused: No    Physically Abused: No    Sexually Abused: No   Family History  Problem Relation Age of Onset   Heart failure Mother    Heart attack Mother        in her 102's   Alzheimer's disease Mother    Diabetes Mother    Asthma Father    Suicidality Father        in his 81's   Allergic rhinitis Sister    Heart failure Maternal Grandmother    Rheum arthritis Maternal Grandmother    Breast cancer Maternal Grandmother    Heart attack Maternal Grandmother        in her 46's   Colon cancer Neg Hx    Esophageal cancer Neg Hx    Pancreatic cancer Neg Hx    Liver disease Neg Hx     Objective: Office vital signs reviewed. BP (!) 110/54   Pulse (!) 58   Temp 98.1 F (36.7 C)   Ht 5' 8" (1.727 m)   Wt 218 lb 9.6 oz (99.2 kg)   SpO2 95%   BMI 33.24 kg/m   Physical Examination:  General: Awake, alert, well nourished, No acute distress HEENT: sclera white, MMM Cardio: regular rate and rhythm, S1S2 heard, no murmurs appreciated Pulm: clear to auscultation bilaterally, no wheezes, rhonchi or rales; normal work of breathing on room air Neuro: see DM foot  Diabetic Foot Exam - Simple   Simple Foot Form Diabetic Foot exam was performed with the following findings: Yes 08/24/2022  5:27 PM  Visual Inspection See comments: Yes Sensation Testing See comments: Yes Pulse Check Posterior Tibialis and Dorsalis pulse intact bilaterally: Yes Comments He has absent monofilament sensation in digits 1, 2, 4, 5 and at the second MTP site on the right.  Sensation is intact entirely on the left.  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  No ulcerations or skin breakdown appreciated     Assessment/ Plan: 71 y.o. male   Controlled type 2 diabetes mellitus with stage 2 chronic kidney disease, with long-term current use of insulin ([place]) - Plan:  Microalbumin / creatinine urine ratio, Bayer DCA Hb A1c Waived  Hyperlipidemia associated with type 2 diabetes mellitus ([place])  Hypertension associated with type 2 diabetes mellitus (HCC)  NAFLD (nonalcoholic fatty liver disease)  Portal hypertension ([place])  Sugar remains very tightly controlled and he has had several episodes of hypoglycemia.  I again reiterated need for less use of that Humalog so as to reduce risk of adverse/life-threatening hypoglycemia.  Urine microalbumin completed today.  Not yet due for fasting lipid.  Continue current medication regimen for blood pressure, cholesterol.  I reviewed his hepatology notes.  No orders of the defined types were placed in this encounter.  No orders of the defined types were placed in this encounter.    [name], DO [place] [phone]

## 2022-08-25 ENCOUNTER — Encounter: Payer: Self-pay | Admitting: Family Medicine

## 2022-08-25 LAB — MICROALBUMIN / CREATININE URINE RATIO
Creatinine, Urine: 64.3 mg/dL
Microalb/Creat Ratio: 34 mg/g creat — ABNORMAL HIGH (ref 0–29)
Microalbumin, Urine: 21.7 ug/mL

## 2022-08-28 ENCOUNTER — Encounter: Payer: Self-pay | Admitting: Family Medicine

## 2022-08-31 ENCOUNTER — Other Ambulatory Visit: Payer: Self-pay | Admitting: Family Medicine

## 2022-08-31 DIAGNOSIS — J301 Allergic rhinitis due to pollen: Secondary | ICD-10-CM

## 2022-09-02 ENCOUNTER — Telehealth (INDEPENDENT_AMBULATORY_CARE_PROVIDER_SITE_OTHER): Payer: BC Managed Care – PPO | Admitting: Family Medicine

## 2022-09-02 ENCOUNTER — Telehealth: Payer: Self-pay

## 2022-09-02 ENCOUNTER — Encounter: Payer: Self-pay | Admitting: Family Medicine

## 2022-09-02 ENCOUNTER — Other Ambulatory Visit: Payer: Self-pay | Admitting: Family Medicine

## 2022-09-02 DIAGNOSIS — U071 COVID-19: Secondary | ICD-10-CM

## 2022-09-02 DIAGNOSIS — J069 Acute upper respiratory infection, unspecified: Secondary | ICD-10-CM

## 2022-09-02 DIAGNOSIS — R6889 Other general symptoms and signs: Secondary | ICD-10-CM | POA: Diagnosis not present

## 2022-09-02 MED ORDER — BENZONATATE 100 MG PO CAPS: 100.0000 mg | ORAL_CAPSULE | Freq: Three times a day (TID) | ORAL | 0 refills | Status: DC | PRN

## 2022-09-02 MED ORDER — MOLNUPIRAVIR EUA 200MG CAPSULE: 4.0000 | ORAL_CAPSULE | Freq: Two times a day (BID) | ORAL | 0 refills | Status: AC

## 2022-09-02 MED ORDER — COVID-19 AT-HOME TEST VI KIT: PACK | 2 refills | Status: DC

## 2022-09-02 MED ORDER — MOLNUPIRAVIR EUA 200MG CAPSULE: 4.0000 | ORAL_CAPSULE | Freq: Two times a day (BID) | ORAL | 0 refills | Status: DC

## 2022-09-02 MED ORDER — OSELTAMIVIR PHOSPHATE 75 MG PO CAPS: 75.0000 mg | ORAL_CAPSULE | Freq: Two times a day (BID) | ORAL | 0 refills | Status: DC

## 2022-09-02 MED ORDER — PROMETHAZINE-DM 6.25-15 MG/5ML PO SYRP: 2.5000 mL | ORAL_SOLUTION | Freq: Four times a day (QID) | ORAL | 0 refills | Status: DC | PRN

## 2022-09-02 NOTE — Progress Notes (Signed)
Telephone visit  Subjective: CC:URI PCP: Tony Norlander, DO UMP:NTIRWER W [name] is a 72 y.o. male. Patient provides verbal consent for consult held via telephone.  Due to COVID-19 pandemic this visit was conducted virtually. This visit type was conducted due to national recommendations for restrictions regarding the COVID-19 Pandemic (e.g. social distancing, sheltering in place) in an effort to limit this patient's exposure and mitigate transmission in our community. All issues noted in this document were discussed and addressed.  A physical exam was not performed with this format.   Location of patient: home Location of provider: [facility] Others present for call: none  1. URI Started Wednesday.  He reports low grade fevers 99.26F.  He reports chills. Rhinorrhea and bronchial irritation.  Has been using Alkaseltzer plus and OTC cold product.  Has not tested for COVID.  Does not report wheezing, shortness of breath but does note productive cough with lightly colored sputum. No hemoptysis or brown sputum.  ROS: Per HPI  Allergies  Allergen Reactions   Dimetapp Children's Cold-Cough     Chest discomfort    Erythromycin Diarrhea   Sulfonamide Derivatives Diarrhea   Past Medical History:  Diagnosis Date   Aortic atherosclerosis (HCC)    Aortic stenosis    Arthritis    Asthma    BPH (benign prostatic hypertrophy)    Cirrhosis ([place]) 2016   Colon polyps    Diabetes mellitus without complication ([place])    TYPE 1    Diverticulosis    Dysrhythmia 04/19/2021   A-fib noted with possible A-fib RVR   Gastric ulcer    Gastric varices    right   Gastritis    GERD (gastroesophageal reflux disease)    Headache    History- pt states these were migraines that occurred in the 1980's   Heart murmur    History of kidney stones    Hx of adenomatous colonic polyps    Hypertension    PONV (postoperative nausea and vomiting)    pt states he has never had post op nausea or vomiting   Portal  hypertensive gastropathy (HCC)    Renal cyst, right    Seizures (HCC)    HYPOGLYCEMIC LAST 1 AND 1/2 YRS AGO   Sleep apnea    uses CPAP nightly   Testicle trouble    one testicle BORN WITH   Tubular adenoma of colon     Current Outpatient Medications:    acetaminophen (TYLENOL) 500 MG tablet, Take 1,000 mg by mouth every 6 (six) hours as needed for moderate pain., Disp: , Rfl:    apixaban (ELIQUIS) 5 MG TABS tablet, Take 1 tablet (5 mg total) by mouth 2 (two) times daily., Disp: 180 tablet, Rfl: 3   atorvastatin (LIPITOR) 40 MG tablet, TAKE 1 TABLET BY MOUTH DAILY (Patient taking differently: Take 40 mg by mouth at bedtime.), Disp: 90 tablet, Rfl: 3   b complex vitamins tablet, Take 1 tablet by mouth daily., Disp: , Rfl:    Blood Glucose Monitoring Suppl (CONTOUR NEXT ONE) KIT, Test BS QID and as needed Dx E11.9, Disp: 500 kit, Rfl: 3   Cholecalciferol (DIALYVITE VITAMIN D 5000) 125 MCG (5000 UT) capsule, Take 5,000 Units by mouth daily., Disp: , Rfl:    Chromium Picolinate (CHROMIUM PICOLATE PO), Take 1 tablet by mouth daily., Disp: , Rfl:    CINNAMON PO, Take 1,080 mg by mouth daily., Disp: , Rfl:    Coenzyme Q10 (COQ10) 100 MG CAPS, Take 100 mg by mouth  daily., Disp: , Rfl:    Continuous Blood Gluc Receiver (FREESTYLE LIBRE READER) DEVI, 1 applicator by Does not apply route as directed., Disp: 1 Device, Rfl: 1   Continuous Blood Gluc Sensor ([place]) MISC, Check BS eight (8) times a day. Dx E10.9, Disp: 7 each, Rfl: 3   diltiazem (CARDIZEM) 30 MG tablet, TAKE 1 TABLET BY MOUTH  TWICE DAILY, Disp: 180 tablet, Rfl: 3   furosemide (LASIX) 20 MG tablet, TAKE 1 AND 1/2 TABLETS BY MOUTH  DAILY (Patient taking differently: Take 20 mg by mouth daily.), Disp: 135 tablet, Rfl: 3   Glucagon, rDNA, (GLUCAGON EMERGENCY) 1 MG KIT, INJECT AS DIRECTED INTO  UPPER ARM, THIGH OR  BUTTOCKS AS NEEDED FOR  SEVERE HYPOGLYCEMIA. SEEK  MEDICAL ATTENTION AFTER USE (Patient taking  differently: Inject 1 mg into the muscle as needed (HYPOGLYCEMIA).), Disp: 3 kit, Rfl: 0   Glucosamine-Chondroit-Vit C-Mn (GLUCOSAMINE 1500 COMPLEX) CAPS, Take 2 capsules by mouth daily., Disp: , Rfl:    glucose blood (CONTOUR NEXT TEST) test strip, Test BS QID and as needed Dx E11.9, Disp: 400 strip, Rfl: 3   insulin lispro (HUMALOG) 100 UNIT/ML injection, INJECT SUBCUTANEOUSLY 20 TO 50  UNITS 3 TIMES DAILY BEFORE MEALS PER SLIDING SCALE (Patient taking differently: Inject 10-30 Units into the skin See admin instructions. Inject 10-30 units up to 7 times daily as needed for high blood sugar), Disp: 140 mL, Rfl: 3   Magnesium 250 MG TABS, Take 250 mg by mouth daily., Disp: , Rfl:    METAMUCIL FIBER PO, Take 1 capsule by mouth 2 (two) times daily., Disp: , Rfl:    Microlet Lancets MISC, Test BS QID and as needed Dx E11.9, Disp: 500 each, Rfl: 3   montelukast (SINGULAIR) 10 MG tablet, TAKE 1 TABLET BY MOUTH AT  BEDTIME, Disp: 90 tablet, Rfl: 3   Multiple Vitamins-Minerals (EYE HEALTH) CAPS, Take 1 capsule by mouth daily., Disp: , Rfl:    Na Sulfate-K Sulfate-Mg Sulf (SUPREP BOWEL PREP KIT) 17.5-3.13-1.6 GM/177ML SOLN, Take 1 kit by mouth as directed., Disp: 324 mL, Rfl: 0   Oxycodone HCl 10 MG TABS, SMARTSIG:0.5 Tablet(s) By Mouth Every 4 Hours PRN, Disp: , Rfl:    pantoprazole (PROTONIX) 40 MG tablet, TAKE 1 TABLET BY MOUTH TWICE  DAILY BEFORE MEALS, Disp: 180 tablet, Rfl: 3   potassium chloride SA (KLOR-CON M) 20 MEQ tablet, Take 2 tablets (40 mEq total) by mouth 2 (two) times daily., Disp: 10 tablet, Rfl: 0   tiZANidine (ZANAFLEX) 2 MG tablet, Take 2 mg by mouth every 8 (eight) hours as needed for muscle spasms., Disp: , Rfl:    TOUJEO MAX SOLOSTAR 300 UNIT/ML Solostar Pen, INJECT SUBCUTANEOUSLY 110 UNITS  DAILY (Patient taking differently: Inject 70 Units into the skin at bedtime.), Disp: 36 mL, Rfl: 3   Turmeric Curcumin 500 MG CAPS, Take 500 mg by mouth daily., Disp: , Rfl:   Assessment/  Plan: 72 y.o. male   Flu-like symptoms - Plan: oseltamivir (TAMIFLU) 75 MG capsule, benzonatate (TESSALON PERLES) 100 MG capsule, promethazine-dextromethorphan (PROMETHAZINE-DM) 6.25-15 MG/5ML syrup, COVID-19 At-Home Test KIT  Empiric treatment for Flu.  Offered testing but patient declines.  Will order home COVID test.  He will call if positive.  Discussed signs and symptoms of bacterial infection.  Caution sedation with cough syrup.  Follow up prn  Start time: 10:10am End time: 10:19am  Total time spent on patient care (including video visit/ documentation): 9 minutes  Tony Cantu M  [place], [place] [phone]

## 2022-09-02 NOTE — Addendum Note (Signed)
Addended by: Everlean Cherry on: 09/02/2022 04:02 PM   Modules accepted: Orders

## 2022-09-02 NOTE — Telephone Encounter (Signed)
Have him get that transferred because the Paxlovid has drug interactions.

## 2022-09-02 NOTE — Telephone Encounter (Signed)
[facility] does not have molnupivr in stock- would you like paxlovid or send to different pharmacy

## 2022-09-02 NOTE — Telephone Encounter (Signed)
MEDS SENT TO [facility] PER PT REQUEST

## 2022-09-03 DIAGNOSIS — E1122 Type 2 diabetes mellitus with diabetic chronic kidney disease: Secondary | ICD-10-CM | POA: Diagnosis not present

## 2022-09-03 DIAGNOSIS — N182 Chronic kidney disease, stage 2 (mild): Secondary | ICD-10-CM | POA: Diagnosis not present

## 2022-09-03 DIAGNOSIS — Z794 Long term (current) use of insulin: Secondary | ICD-10-CM | POA: Diagnosis not present

## 2022-09-12 ENCOUNTER — Telehealth: Payer: Self-pay | Admitting: Family Medicine

## 2022-09-12 NOTE — Telephone Encounter (Signed)
Pt is extremely fatigues. Wants to sleep all day. Appetite is low.  Advised that symptoms can be normal after covid. He needs to increase water intake and eat frequent small meals. Wife would like for pt to be seen but cannot drive himself to the office because he is so weak.  Telephone scheduled  with G 1/16 at 4:30.

## 2022-09-13 ENCOUNTER — Telehealth (INDEPENDENT_AMBULATORY_CARE_PROVIDER_SITE_OTHER): Payer: BC Managed Care – PPO | Admitting: Family Medicine

## 2022-09-13 DIAGNOSIS — U071 COVID-19: Secondary | ICD-10-CM

## 2022-09-13 DIAGNOSIS — R058 Other specified cough: Secondary | ICD-10-CM

## 2022-09-13 MED ORDER — PROMETHAZINE-DM 6.25-15 MG/5ML PO SYRP: 2.5000 mL | ORAL_SOLUTION | Freq: Four times a day (QID) | ORAL | 0 refills | Status: DC | PRN

## 2022-09-13 MED ORDER — BENZONATATE 100 MG PO CAPS: 100.0000 mg | ORAL_CAPSULE | Freq: Three times a day (TID) | ORAL | 1 refills | Status: DC | PRN

## 2022-09-13 MED ORDER — AZITHROMYCIN 250 MG PO TABS: ORAL_TABLET | ORAL | 0 refills | Status: DC

## 2022-09-13 NOTE — Progress Notes (Signed)
Telephone visit  Subjective: [id number] PCP: [name], DO GUR:[id number] Tony Cantu is a 72 y.o. male calls for telephone consult today. Patient provides verbal consent for consult held via phone.  Due to COVID-19 pandemic this visit was conducted virtually. This visit type was conducted due to national recommendations for restrictions regarding the COVID-19 Pandemic (e.g. social distancing, sheltering in place) in an effort to limit this patient's exposure and mitigate transmission in our community. All issues noted in this document were discussed and addressed.  A physical exam was not performed with this format.   Location of patient: home Location of provider: [facility] Others present for call: none  1. COVID-19 Diagnosed with COVID 19 last week and s/p treated with Molnupiravir.  Reports low appetite and some weight loss.  Reports some brown sputum that is intermittent.  Reports that this started last week. No shortness of breath.  Temp staying below 100F. Hydrating without difficulty. Having some elevation in BG despite less eating.   ROS: Per HPI  Allergies  Allergen Reactions   Dimetapp Children's Cold-Cough     Chest discomfort    Erythromycin Diarrhea   Sulfonamide Derivatives Diarrhea   Past Medical History:  Diagnosis Date   Aortic atherosclerosis (HCC)    Aortic stenosis    Arthritis    Asthma    BPH (benign prostatic hypertrophy)    Cirrhosis ([place]) 2016   Colon polyps    Diabetes mellitus without complication ([place])    TYPE 1    Diverticulosis    Dysrhythmia 04/19/2021   A-fib noted with possible A-fib RVR   Gastric ulcer    Gastric varices    right   Gastritis    GERD (gastroesophageal reflux disease)    Headache    History- pt states these were migraines that occurred in the 1980's   Heart murmur    History of kidney stones    Hx of adenomatous colonic polyps    Hypertension    PONV (postoperative nausea and vomiting)    pt states he has never had post  op nausea or vomiting   Portal hypertensive gastropathy (HCC)    Renal cyst, right    Seizures (HCC)    HYPOGLYCEMIC LAST 1 AND 1/2 YRS AGO   Sleep apnea    uses CPAP nightly   Testicle trouble    one testicle BORN WITH   Tubular adenoma of colon     Current Outpatient Medications:    acetaminophen (TYLENOL) 500 MG tablet, Take 1,000 mg by mouth every 6 (six) hours as needed for moderate pain., Disp: , Rfl:    apixaban (ELIQUIS) 5 MG TABS tablet, Take 1 tablet (5 mg total) by mouth 2 (two) times daily., Disp: 180 tablet, Rfl: 3   atorvastatin (LIPITOR) 40 MG tablet, TAKE 1 TABLET BY MOUTH DAILY (Patient taking differently: Take 40 mg by mouth at bedtime.), Disp: 90 tablet, Rfl: 3   b complex vitamins tablet, Take 1 tablet by mouth daily., Disp: , Rfl:    benzonatate (TESSALON PERLES) 100 MG capsule, Take 1 capsule (100 mg total) by mouth 3 (three) times daily as needed for cough., Disp: 20 capsule, Rfl: 0   Blood Glucose Monitoring Suppl (CONTOUR NEXT ONE) KIT, Test BS QID and as needed Dx E11.9, Disp: 500 kit, Rfl: 3   Cholecalciferol (DIALYVITE VITAMIN D 5000) 125 MCG (5000 UT) capsule, Take 5,000 Units by mouth daily., Disp: , Rfl:    Chromium Picolinate (CHROMIUM PICOLATE PO), Take 1 tablet  by mouth daily., Disp: , Rfl:    CINNAMON PO, Take 1,080 mg by mouth daily., Disp: , Rfl:    Coenzyme Q10 (COQ10) 100 MG CAPS, Take 100 mg by mouth daily., Disp: , Rfl:    Continuous Blood Gluc Receiver (FREESTYLE LIBRE READER) DEVI, 1 applicator by Does not apply route as directed., Disp: 1 Device, Rfl: 1   Continuous Blood Gluc Sensor (Spicer) MISC, Check BS eight (8) times a day. Dx E10.9, Disp: 7 each, Rfl: 3   COVID-19 At-Home Test KIT, UAD to test for COVID 19, Disp: 1 kit, Rfl: 2   diltiazem (CARDIZEM) 30 MG tablet, TAKE 1 TABLET BY MOUTH  TWICE DAILY, Disp: 180 tablet, Rfl: 3   furosemide (LASIX) 20 MG tablet, TAKE 1 AND 1/2 TABLETS BY MOUTH  DAILY (Patient taking  differently: Take 20 mg by mouth daily.), Disp: 135 tablet, Rfl: 3   Glucagon, rDNA, (GLUCAGON EMERGENCY) 1 MG KIT, INJECT AS DIRECTED INTO  UPPER ARM, THIGH OR  BUTTOCKS AS NEEDED FOR  SEVERE HYPOGLYCEMIA. SEEK  MEDICAL ATTENTION AFTER USE (Patient taking differently: Inject 1 mg into the muscle as needed (HYPOGLYCEMIA).), Disp: 3 kit, Rfl: 0   Glucosamine-Chondroit-Vit C-Mn (GLUCOSAMINE 1500 COMPLEX) CAPS, Take 2 capsules by mouth daily., Disp: , Rfl:    glucose blood (CONTOUR NEXT TEST) test strip, Test BS QID and as needed Dx E11.9, Disp: 400 strip, Rfl: 3   insulin lispro (HUMALOG) 100 UNIT/ML injection, INJECT SUBCUTANEOUSLY 20 TO 50  UNITS 3 TIMES DAILY BEFORE MEALS PER SLIDING SCALE (Patient taking differently: Inject 10-30 Units into the skin See admin instructions. Inject 10-30 units up to 7 times daily as needed for high blood sugar), Disp: 140 mL, Rfl: 3   Magnesium 250 MG TABS, Take 250 mg by mouth daily., Disp: , Rfl:    METAMUCIL FIBER PO, Take 1 capsule by mouth 2 (two) times daily., Disp: , Rfl:    Microlet Lancets MISC, Test BS QID and as needed Dx E11.9, Disp: 500 each, Rfl: 3   montelukast (SINGULAIR) 10 MG tablet, TAKE 1 TABLET BY MOUTH AT  BEDTIME, Disp: 90 tablet, Rfl: 3   Multiple Vitamins-Minerals (EYE HEALTH) CAPS, Take 1 capsule by mouth daily., Disp: , Rfl:    Na Sulfate-K Sulfate-Mg Sulf (SUPREP BOWEL PREP KIT) 17.5-3.13-1.6 GM/177ML SOLN, Take 1 kit by mouth as directed., Disp: 324 mL, Rfl: 0   Oxycodone HCl 10 MG TABS, SMARTSIG:0.5 Tablet(s) By Mouth Every 4 Hours PRN, Disp: , Rfl:    pantoprazole (PROTONIX) 40 MG tablet, TAKE 1 TABLET BY MOUTH TWICE  DAILY BEFORE MEALS, Disp: 180 tablet, Rfl: 3   potassium chloride SA (KLOR-CON M) 20 MEQ tablet, Take 2 tablets (40 mEq total) by mouth 2 (two) times daily., Disp: 10 tablet, Rfl: 0   promethazine-dextromethorphan (PROMETHAZINE-DM) 6.25-15 MG/5ML syrup, Take 2.5 mLs by mouth 4 (four) times daily as needed for cough., Disp:  118 mL, Rfl: 0   tiZANidine (ZANAFLEX) 2 MG tablet, Take 2 mg by mouth every 8 (eight) hours as needed for muscle spasms., Disp: , Rfl:    TOUJEO MAX SOLOSTAR 300 UNIT/ML Solostar Pen, INJECT SUBCUTANEOUSLY 110 UNITS  DAILY (Patient taking differently: Inject 70 Units into the skin at bedtime.), Disp: 36 mL, Rfl: 3   Turmeric Curcumin 500 MG CAPS, Take 500 mg by mouth daily., Disp: , Rfl:   Assessment/ Plan: 72 y.o. male   COVID-19 - Plan: azithromycin (ZITHROMAX) 250 MG tablet, promethazine-dextromethorphan (PROMETHAZINE-DM) 6.25-15 MG/5ML  syrup  Productive cough - Plan: benzonatate (TESSALON PERLES) 100 MG capsule, promethazine-dextromethorphan (PROMETHAZINE-DM) 6.25-15 MG/5ML syrup  Given brown sputum, will empirically treat for bacterial lung infection.   Start time: 11:11a End time: 11:19am  Total time spent on patient care (including telephone call/ virtual visit): 8 minutes  [place], [place] [phone]

## 2022-09-23 ENCOUNTER — Telehealth: Payer: Self-pay | Admitting: Family Medicine

## 2022-09-23 NOTE — Telephone Encounter (Signed)
Wife states that patient is not sleeping.  States otc is not working.  Advised wife that patient would need a visit in order for something to be call in to help him sleep.  States that since it was the weekend that is why she called.  You are booked today- would you be willing to do a video visit with patient ? Wife also states that she threw away his pain medication and the surgeons office is not calling her back.  Wanting to know if you would give him enough until they call back since it is the weekend.  Advised wife that she needs to keep trying to get in touch with surgeons office.

## 2022-09-23 NOTE — Telephone Encounter (Signed)
Patient's wife calling because patient has not been sleeping well. Wants to talk to someone about options. Please call back and advise.

## 2022-09-23 NOTE — Telephone Encounter (Signed)
If he wants to do an evisit and will send me the failed meds/ number of hours he is sleeping/ night time awakenings.  Ok to send me [employer].  I'll be here til 5p

## 2022-09-26 ENCOUNTER — Encounter (INDEPENDENT_AMBULATORY_CARE_PROVIDER_SITE_OTHER): Payer: BC Managed Care – PPO

## 2022-09-26 DIAGNOSIS — F5101 Primary insomnia: Secondary | ICD-10-CM | POA: Diagnosis not present

## 2022-09-26 MED ORDER — TRAZODONE HCL 50 MG PO TABS: 25.0000 mg | ORAL_TABLET | Freq: Every evening | ORAL | 3 refills | Status: DC | PRN

## 2022-09-26 NOTE — Telephone Encounter (Signed)

## 2022-09-28 ENCOUNTER — Encounter: Payer: Self-pay | Admitting: Internal Medicine

## 2022-09-30 ENCOUNTER — Telehealth: Payer: Self-pay | Admitting: Internal Medicine

## 2022-09-30 NOTE — Telephone Encounter (Signed)
Patient is calling states he didn't realize the paper says to get off Eliquis a week before so he has yet to get off of it but says he will start now. Please advise

## 2022-09-30 NOTE — Telephone Encounter (Signed)
Reviewed with pt that he is supposed to take his last dose of Eliquis on 2/4 and to hold he med on 2/5&2/6 and the procedure is 2/7. Pt verbalized understanding.

## 2022-10-05 ENCOUNTER — Ambulatory Visit (AMBULATORY_SURGERY_CENTER): Payer: BC Managed Care – PPO | Admitting: Internal Medicine

## 2022-10-05 ENCOUNTER — Encounter: Payer: Self-pay | Admitting: Internal Medicine

## 2022-10-05 VITALS — BP 108/64 | HR 72 | Temp 97.5°F | Resp 17 | Ht 68.0 in | Wt 223.0 lb

## 2022-10-05 DIAGNOSIS — D124 Benign neoplasm of descending colon: Secondary | ICD-10-CM

## 2022-10-05 DIAGNOSIS — D123 Benign neoplasm of transverse colon: Secondary | ICD-10-CM | POA: Diagnosis not present

## 2022-10-05 DIAGNOSIS — K635 Polyp of colon: Secondary | ICD-10-CM | POA: Diagnosis not present

## 2022-10-05 DIAGNOSIS — Z09 Encounter for follow-up examination after completed treatment for conditions other than malignant neoplasm: Secondary | ICD-10-CM

## 2022-10-05 DIAGNOSIS — K5520 Angiodysplasia of colon without hemorrhage: Secondary | ICD-10-CM | POA: Diagnosis not present

## 2022-10-05 DIAGNOSIS — K7460 Unspecified cirrhosis of liver: Secondary | ICD-10-CM

## 2022-10-05 DIAGNOSIS — N182 Chronic kidney disease, stage 2 (mild): Secondary | ICD-10-CM | POA: Diagnosis not present

## 2022-10-05 DIAGNOSIS — G4733 Obstructive sleep apnea (adult) (pediatric): Secondary | ICD-10-CM | POA: Diagnosis not present

## 2022-10-05 DIAGNOSIS — K760 Fatty (change of) liver, not elsewhere classified: Secondary | ICD-10-CM

## 2022-10-05 DIAGNOSIS — K766 Portal hypertension: Secondary | ICD-10-CM

## 2022-10-05 DIAGNOSIS — D120 Benign neoplasm of cecum: Secondary | ICD-10-CM | POA: Diagnosis not present

## 2022-10-05 DIAGNOSIS — I10 Essential (primary) hypertension: Secondary | ICD-10-CM | POA: Diagnosis not present

## 2022-10-05 DIAGNOSIS — Z86010 Personal history of colonic polyps: Secondary | ICD-10-CM

## 2022-10-05 DIAGNOSIS — E119 Type 2 diabetes mellitus without complications: Secondary | ICD-10-CM | POA: Diagnosis not present

## 2022-10-05 DIAGNOSIS — E1122 Type 2 diabetes mellitus with diabetic chronic kidney disease: Secondary | ICD-10-CM | POA: Diagnosis not present

## 2022-10-05 DIAGNOSIS — Z794 Long term (current) use of insulin: Secondary | ICD-10-CM | POA: Diagnosis not present

## 2022-10-05 HISTORY — PX: COLONOSCOPY WITH ESOPHAGOGASTRODUODENOSCOPY (EGD): SHX5779

## 2022-10-05 MED ORDER — SODIUM CHLORIDE 0.9 % IV SOLN: 500.0000 mL | Freq: Once | INTRAVENOUS | Status: DC

## 2022-10-05 NOTE — Progress Notes (Signed)
GASTROENTEROLOGY PROCEDURE H&P NOTE   Primary Care Physician: [name], DO    Reason for Procedure:  Variceal screening and heme + stools and patient with compensated metabolic associated cirrhosis, history of colon polyps  Plan:    EGD and colonoscopy  Patient is appropriate for endoscopic procedure(s) in the ambulatory ([place]) setting.  The nature of the procedure, as well as the risks, benefits, and alternatives were carefully and thoroughly reviewed with the patient. Ample time for discussion and questions allowed. The patient understood, was satisfied, and agreed to proceed.     HPI: Tony Cantu is a 72 y.o. male who presents for EGD and colonoscopy.  Medical history as below.  Tolerated the prep.  No recent chest pain or shortness of breath.  No abdominal pain today.  Eliquis on hold x 2 days.  Past Medical History:  Diagnosis Date   Aortic atherosclerosis ([place])    Aortic stenosis    Arthritis    Asthma    BPH (benign prostatic hypertrophy)    Cirrhosis ([place]) 2016   Colon polyps    Diabetes mellitus without complication ([place])    TYPE 1    Diverticulosis    Dysrhythmia 04/19/2021   A-fib noted with possible A-fib RVR   Gastric ulcer    Gastric varices    right   Gastritis    GERD (gastroesophageal reflux disease)    Headache    History- pt states these were migraines that occurred in the 1980's   Heart murmur    History of kidney stones    Hx of adenomatous colonic polyps    Hypertension    PONV (postoperative nausea and vomiting)    pt states he has never had post op nausea or vomiting   Portal hypertensive gastropathy (HCC)    Renal cyst, right    Seizures (HCC)    HYPOGLYCEMIC LAST 1 AND 1/2 YRS AGO   Sleep apnea    uses CPAP nightly   Testicle trouble    one testicle BORN WITH   Tubular adenoma of colon     Past Surgical History:  Procedure Laterality Date   BACK SURGERY     47  LOWER    CARPAL TUNNEL RELEASE Right 10/13/2015    Procedure: RIGHT CARPAL TUNNEL RELEASE;  Surgeon: Daryll Brod, MD;  Location: [facility];  Service: Orthopedics;  Laterality: Right;   CARPAL TUNNEL RELEASE Left 07/05/2016   Procedure: LEFT CARPAL TUNNEL RELEASE;  Surgeon: Daryll Brod, MD;  Location: [facility];  Service: Orthopedics;  Laterality: Left;   DRUG INDUCED ENDOSCOPY N/A 07/02/2021   Procedure: DRUG INDUCED SLEEP ENDOSCOPY;  Surgeon: Melida Quitter, MD;  Location: [facility];  Service: ENT;  Laterality: N/A;   IMPLANTATION OF HYPOGLOSSAL NERVE STIMULATOR Right 10/01/2021   Procedure: IMPLANTATION OF HYPOGLOSSAL NERVE STIMULATOR;  Surgeon: Melida Quitter, MD;  Location: [facility];  Service: ENT;  Laterality: Right;   INGUINAL HERNIA REPAIR  2003   right    KNEE ARTHROSCOPY Right 03/03/2016   LUMBAR DISC SURGERY  03/1995   Dr. Coralyn Mark, discectomy   LUMBAR LAMINECTOMY/DECOMPRESSION MICRODISCECTOMY Right 10/06/2016   Procedure: RIGHT LUMBAR THREE - LUMBAR FOUR  LAMINECTOMY, FORAMINOTOMY AND MICRODISCECTOMY;  Surgeon: Jovita Gamma, MD;  Location: [facility];  Service: Neurosurgery;  Laterality: Right;   SHOULDER SURGERY  11/28/2005   left partial   SHOULDER SURGERY  07/14/2006   RIGHT   TONSILLECTOMY  AGE 59 OR 4   TOTAL KNEE  ARTHROPLASTY Right 03/07/2022   Procedure: RIGHT TOTAL KNEE ARTHROPLASTY;  Surgeon: Frederik Pear, MD;  Location: [facility];  Service: Orthopedics;  Laterality: Right;   TOTAL KNEE ARTHROPLASTY Left 06/06/2022   Procedure: LEFT TOTAL KNEE ARTHROPLASTY;  Surgeon: Frederik Pear, MD;  Location: [facility];  Service: Orthopedics;  Laterality: Left;   TOTAL SHOULDER ARTHROPLASTY Right 11/01/2018   Procedure: RIGHT SHOULDER REVISION TO REVERSE TOTAL SHOULDER;  Surgeon: Tania Ade, MD;  Location: [facility];  Service: Orthopedics;  Laterality: Right;  CHOICE ANESTHESIA WITH INTERSCALENE BLOCK EXPAREL, NEEDS RNFA    Prior to Admission medications   Medication Sig Start Date End Date Taking? Authorizing Provider   acetaminophen (TYLENOL) 500 MG tablet Take 1,000 mg by mouth every 6 (six) hours as needed for moderate pain.    [provider]  apixaban (ELIQUIS) 5 MG TABS tablet Take 1 tablet (5 mg total) by mouth 2 (two) times daily. 08/08/22   Minus Breeding, MD  atorvastatin (LIPITOR) 40 MG tablet TAKE 1 TABLET BY MOUTH DAILY Patient taking differently: Take 40 mg by mouth at bedtime. 03/16/22   [name], DO  azithromycin (ZITHROMAX) 250 MG tablet Take 2 tablets today, then take 1 tablet daily until gone. 09/13/22   [name], DO  b complex vitamins tablet Take 1 tablet by mouth daily.    [provider]  benzonatate (TESSALON PERLES) 100 MG capsule Take 1 capsule (100 mg total) by mouth 3 (three) times daily as needed for cough. 09/13/22   [name], DO  Blood Glucose Monitoring Suppl (CONTOUR NEXT ONE) KIT Test BS QID and as needed Dx E11.9 06/04/20   [name], DO  Cholecalciferol (DIALYVITE VITAMIN D 5000) 125 MCG (5000 UT) capsule Take 5,000 Units by mouth daily.    [provider]  Chromium Picolinate (CHROMIUM PICOLATE PO) Take 1 tablet by mouth daily.    [provider]  CINNAMON PO Take 1,080 mg by mouth daily.    [provider]  Coenzyme Q10 (COQ10) 100 MG CAPS Take 100 mg by mouth daily.    [provider]  Continuous Blood Gluc Receiver (FREESTYLE LIBRE READER) DEVI 1 applicator by Does not apply route as directed. 08/10/17   Chipper Herb, MD  Continuous Blood Gluc Sensor (FREESTYLE LIBRE SENSOR SYSTEM) MISC Check BS eight (8) times a day. Dx E10.9 06/29/22   [name], DO  COVID-19 At-Home Test KIT UAD to test for COVID 19 09/02/22   Gottschalk, Leatrice Jewels M, DO  diltiazem (CARDIZEM) 30 MG tablet TAKE 1 TABLET BY MOUTH  TWICE DAILY 03/02/22   Minus Breeding, MD  furosemide (LASIX) 20 MG tablet TAKE 1 AND 1/2 TABLETS BY MOUTH  DAILY Patient taking differently: Take 20 mg by mouth daily. 03/16/22    [name], DO  Glucagon, rDNA, (GLUCAGON EMERGENCY) 1 MG KIT INJECT AS DIRECTED INTO  UPPER ARM, THIGH OR  BUTTOCKS AS NEEDED FOR  SEVERE HYPOGLYCEMIA. SEEK  MEDICAL ATTENTION AFTER USE Patient taking differently: Inject 1 mg into the muscle as needed (HYPOGLYCEMIA). 06/14/21   Loman Brooklyn, FNP  Glucosamine-Chondroit-Vit C-Mn (GLUCOSAMINE 1500 COMPLEX) CAPS Take 2 capsules by mouth daily.    [provider]  glucose blood (CONTOUR NEXT TEST) test strip Test BS QID and as needed Dx E11.9 11/08/21   Ronnie Doss M, DO  insulin lispro (HUMALOG) 100 UNIT/ML injection INJECT SUBCUTANEOUSLY 20 TO 50  UNITS 3 TIMES DAILY BEFORE MEALS PER SLIDING SCALE Patient  taking differently: Inject 10-30 Units into the skin See admin instructions. Inject 10-30 units up to 7 times daily as needed for high blood sugar 05/09/22   [name], DO  Magnesium 250 MG TABS Take 250 mg by mouth daily.    [provider]  METAMUCIL FIBER PO Take 1 capsule by mouth 2 (two) times daily.    [provider]  Microlet Lancets MISC Test BS QID and as needed Dx E11.9 06/04/20   Ronnie Doss M, DO  montelukast (SINGULAIR) 10 MG tablet TAKE 1 TABLET BY MOUTH AT  BEDTIME 08/31/22   Ronnie Doss M, DO  Multiple Vitamins-Minerals (EYE HEALTH) CAPS Take 1 capsule by mouth daily.    [provider]  Na Sulfate-K Sulfate-Mg Sulf (SUPREP BOWEL PREP KIT) 17.5-3.13-1.6 GM/177ML SOLN Take 1 kit by mouth as directed. 08/03/22   Shrinika Blatz, Lajuan Lines, MD  Oxycodone HCl 10 MG TABS SMARTSIG:0.5 Tablet(s) By Mouth Every 4 Hours PRN 06/16/22   [provider]  pantoprazole (PROTONIX) 40 MG tablet TAKE 1 TABLET BY MOUTH TWICE  DAILY BEFORE MEALS 07/20/22   Maley Venezia, Lajuan Lines, MD  potassium chloride SA (KLOR-CON M) 20 MEQ tablet Take 2 tablets (40 mEq total) by mouth 2 (two) times daily. 08/03/22   Abdulwahab Demelo, Lajuan Lines, MD  promethazine-dextromethorphan (PROMETHAZINE-DM) 6.25-15 MG/5ML syrup Take 2.5  mLs by mouth 4 (four) times daily as needed for cough. 09/13/22   [name], DO  tiZANidine (ZANAFLEX) 2 MG tablet Take 2 mg by mouth every 8 (eight) hours as needed for muscle spasms. 02/16/22   [provider]  TOUJEO MAX SOLOSTAR 300 UNIT/ML Solostar Pen INJECT SUBCUTANEOUSLY 110 UNITS  DAILY Patient taking differently: Inject 70 Units into the skin at bedtime. 05/12/22   [name], DO  traZODone (DESYREL) 50 MG tablet Take 0.5-1 tablets (25-50 mg total) by mouth at bedtime as needed for sleep. 09/26/22   [name], DO  Turmeric Curcumin 500 MG CAPS Take 500 mg by mouth daily.    [provider]    Current Outpatient Medications  Medication Sig Dispense Refill   acetaminophen (TYLENOL) 500 MG tablet Take 1,000 mg by mouth every 6 (six) hours as needed for moderate pain.     apixaban (ELIQUIS) 5 MG TABS tablet Take 1 tablet (5 mg total) by mouth 2 (two) times daily. 180 tablet 3   atorvastatin (LIPITOR) 40 MG tablet TAKE 1 TABLET BY MOUTH DAILY (Patient taking differently: Take 40 mg by mouth at bedtime.) 90 tablet 3   azithromycin (ZITHROMAX) 250 MG tablet Take 2 tablets today, then take 1 tablet daily until gone. 6 tablet 0   b complex vitamins tablet Take 1 tablet by mouth daily.     benzonatate (TESSALON PERLES) 100 MG capsule Take 1 capsule (100 mg total) by mouth 3 (three) times daily as needed for cough. 20 capsule 1   Blood Glucose Monitoring Suppl (CONTOUR NEXT ONE) KIT Test BS QID and as needed Dx E11.9 500 kit 3   Cholecalciferol (DIALYVITE VITAMIN D 5000) 125 MCG (5000 UT) capsule Take 5,000 Units by mouth daily.     Chromium Picolinate (CHROMIUM PICOLATE PO) Take 1 tablet by mouth daily.     CINNAMON PO Take 1,080 mg by mouth daily.     Coenzyme Q10 (COQ10) 100 MG CAPS Take 100 mg by mouth daily.     Continuous Blood Gluc Receiver (FREESTYLE LIBRE READER) DEVI 1 applicator by Does not apply route as  directed. 1 Device 1   Continuous  Blood Gluc Sensor (FREESTYLE LIBRE SENSOR SYSTEM) MISC Check BS eight (8) times a day. Dx E10.9 7 each 3   COVID-19 At-Home Test KIT UAD to test for COVID 19 1 kit 2   diltiazem (CARDIZEM) 30 MG tablet TAKE 1 TABLET BY MOUTH  TWICE DAILY 180 tablet 3   furosemide (LASIX) 20 MG tablet TAKE 1 AND 1/2 TABLETS BY MOUTH  DAILY (Patient taking differently: Take 20 mg by mouth daily.) 135 tablet 3   Glucagon, rDNA, (GLUCAGON EMERGENCY) 1 MG KIT INJECT AS DIRECTED INTO  UPPER ARM, THIGH OR  BUTTOCKS AS NEEDED FOR  SEVERE HYPOGLYCEMIA. SEEK  MEDICAL ATTENTION AFTER USE (Patient taking differently: Inject 1 mg into the muscle as needed (HYPOGLYCEMIA).) 3 kit 0   Glucosamine-Chondroit-Vit C-Mn (GLUCOSAMINE 1500 COMPLEX) CAPS Take 2 capsules by mouth daily.     glucose blood (CONTOUR NEXT TEST) test strip Test BS QID and as needed Dx E11.9 400 strip 3   insulin lispro (HUMALOG) 100 UNIT/ML injection INJECT SUBCUTANEOUSLY 20 TO 50  UNITS 3 TIMES DAILY BEFORE MEALS PER SLIDING SCALE (Patient taking differently: Inject 10-30 Units into the skin See admin instructions. Inject 10-30 units up to 7 times daily as needed for high blood sugar) 140 mL 3   Magnesium 250 MG TABS Take 250 mg by mouth daily.     METAMUCIL FIBER PO Take 1 capsule by mouth 2 (two) times daily.     Microlet Lancets MISC Test BS QID and as needed Dx E11.9 500 each 3   montelukast (SINGULAIR) 10 MG tablet TAKE 1 TABLET BY MOUTH AT  BEDTIME 90 tablet 3   Multiple Vitamins-Minerals (EYE HEALTH) CAPS Take 1 capsule by mouth daily.     Na Sulfate-K Sulfate-Mg Sulf (SUPREP BOWEL PREP KIT) 17.5-3.13-1.6 GM/177ML SOLN Take 1 kit by mouth as directed. 324 mL 0   Oxycodone HCl 10 MG TABS SMARTSIG:0.5 Tablet(s) By Mouth Every 4 Hours PRN     pantoprazole (PROTONIX) 40 MG tablet TAKE 1 TABLET BY MOUTH TWICE  DAILY BEFORE MEALS 180 tablet 3   potassium chloride SA (KLOR-CON M) 20 MEQ tablet Take 2 tablets (40 mEq total) by mouth 2 (two) times daily. 10 tablet  0   promethazine-dextromethorphan (PROMETHAZINE-DM) 6.25-15 MG/5ML syrup Take 2.5 mLs by mouth 4 (four) times daily as needed for cough. 118 mL 0   tiZANidine (ZANAFLEX) 2 MG tablet Take 2 mg by mouth every 8 (eight) hours as needed for muscle spasms.     TOUJEO MAX SOLOSTAR 300 UNIT/ML Solostar Pen INJECT SUBCUTANEOUSLY 110 UNITS  DAILY (Patient taking differently: Inject 70 Units into the skin at bedtime.) 36 mL 3   traZODone (DESYREL) 50 MG tablet Take 0.5-1 tablets (25-50 mg total) by mouth at bedtime as needed for sleep. 30 tablet 3   Turmeric Curcumin 500 MG CAPS Take 500 mg by mouth daily.     Current Facility-Administered Medications  Medication Dose Route Frequency Provider Last Rate Last Admin   0.9 %  sodium chloride infusion  500 mL Intravenous Once Leonda Cristo, Lajuan Lines, MD        Allergies as of 10/05/2022 - Review Complete 10/05/2022  Allergen Reaction Noted   Dimetapp children's cold-cough  02/24/2022   Erythromycin Diarrhea    Sulfonamide derivatives Diarrhea 06/22/2010    Family History  Problem Relation Age of Onset   Heart failure Mother    Heart attack Mother  in her 64's   Alzheimer's disease Mother    Diabetes Mother    Asthma Father    Suicidality Father        in his 72's   Allergic rhinitis Sister    Heart failure Maternal Grandmother    Rheum arthritis Maternal Grandmother    Breast cancer Maternal Grandmother    Heart attack Maternal Grandmother        in her 28's   Colon cancer Neg Hx    Esophageal cancer Neg Hx    Pancreatic cancer Neg Hx    Liver disease Neg Hx     Social History   Socioeconomic History   Marital status: Married    Spouse name: Not on file   Number of children: 1   Years of education: Not on file   Highest education level: Not on file  Occupational History   Occupation: retired   Tobacco Use   Smoking status: Former    Years: 34.00    Types: Cigarettes, Pipe    Quit date: 2005    Years since quitting: 19.1    Smokeless tobacco: Never   Tobacco comments:    quit 2005 smoked cigarettes for 5 yrs prior to pipe use  Vaping Use   Vaping Use: Never used  Substance and Sexual Activity   Alcohol use: Not Currently   Drug use: No   Sexual activity: Yes  Other Topics Concern   Not on file  Social History Narrative   ** Merged History Encounter **    Right handed    Social Determinants of Health   Financial Resource Strain: Low Risk  (06/09/2022)   Overall Financial Resource Strain (CARDIA)    Difficulty of Paying Living Expenses: Not hard at all  Food Insecurity: No Food Insecurity (06/06/2022)   Hunger Vital Sign    Worried About Running Out of Food in the Last Year: Never true    [place] in the Last Year: Never true  Transportation Needs: No Transportation Needs (06/09/2022)   PRAPARE - Hydrologist (Medical): No    Lack of Transportation (Non-Medical): No  Physical Activity: Not on file  Stress: Not on file  Social Connections: Not on file  Intimate Partner Violence: Not At Risk (06/06/2022)   Humiliation, Afraid, Rape, and Kick questionnaire    Fear of Current or Ex-Partner: No    Emotionally Abused: No    Physically Abused: No    Sexually Abused: No    Physical Exam: Vital signs in last 24 hours: '@BP'$  118/60   Pulse 68   Temp (!) 97.5 F (36.4 C) (Temporal)   Ht '5\' 8"'$  (1.727 m)   Wt 223 lb (101.2 kg)   SpO2 98%   BMI 33.91 kg/m  GEN: NAD EYE: Sclerae anicteric ENT: MMM CV: Non-tachycardic Pulm: CTA b/l GI: Soft, NT/ND NEURO:  Alert & Oriented x 3   Zenovia Jarred, MD [facility]  10/05/2022 9:57 AM

## 2022-10-05 NOTE — Op Note (Addendum)
[place] Patient Name: Tony Cantu Procedure Date: 10/05/2022 10:04 AM MRN: [id number] Endoscopist: Jerene Bears , MD, [id number] Age: 72 Referring MD:  Date of Birth: November 25, 1950 Gender: Male Account #: [id number] Procedure:                Colonoscopy Indications:              High risk colon cancer surveillance: Personal                            history of multiple adenomas, Last colonoscopy:                            August 2020 (4 adenomas) Medicines:                Monitored Anesthesia Care Procedure:                Pre-Anesthesia Assessment:                           - Prior to the procedure, a History and Physical                            was performed, and patient medications and                            allergies were reviewed. The patient's tolerance of                            previous anesthesia was also reviewed. The risks                            and benefits of the procedure and the sedation                            options and risks were discussed with the patient.                            All questions were answered, and informed consent                            was obtained. Prior Anticoagulants: The patient has                            taken Eliquis (apixaban), last dose was 2 days                            prior to procedure. ASA Grade Assessment: III - A                            patient with severe systemic disease. After                            reviewing the risks and benefits, the patient was  deemed in satisfactory condition to undergo the                            procedure.                           After obtaining informed consent, the colonoscope                            was passed under direct vision. Throughout the                            procedure, the patient's blood pressure, pulse, and                            oxygen saturations were monitored continuously. The                             CF [id number] #[id number] was introduced through the anus                            and advanced to the cecum, identified by                            appendiceal orifice and ileocecal valve. The                            colonoscopy was performed without difficulty. The                            patient tolerated the procedure well. The quality                            of the bowel preparation was good. The ileocecal                            valve, appendiceal orifice, and rectum were                            photographed. Scope In: 10:13:41 AM Scope Out: 10:29:30 AM Scope Withdrawal Time: 0 hours 11 minutes 32 seconds  Total Procedure Duration: 0 hours 15 minutes 49 seconds  Findings:                 The digital rectal exam was normal.                           Two sessile polyps were found in the cecum. The                            polyps were 3 to 5 mm in size. These polyps were                            removed with a cold snare. Resection and retrieval  were complete.                           A 3 mm polyp was found in the transverse colon. The                            polyp was sessile. The polyp was removed with a                            cold snare. Resection and retrieval were complete.                           A 4 mm polyp was found in the descending colon. The                            polyp was sessile. The polyp was removed with a                            cold snare. Resection and retrieval were complete.                           Multiple large-mouthed and small-mouthed                            diverticula were found in the sigmoid colon,                            descending colon and ascending colon.                           Internal hemorrhoids were found during                            retroflexion. The hemorrhoids were medium-sized. Complications:            No immediate complications. Estimated Blood Loss:      Estimated blood loss was minimal. Impression:               - Two 3 to 5 mm polyps in the cecum, removed with a                            cold snare. Resected and retrieved.                           - One 3 mm polyp in the transverse colon, removed                            with a cold snare. Resected and retrieved.                           - One 4 mm polyp in the descending colon, removed                            with a cold snare.  Resected and retrieved.                           - Moderate diverticulosis in the sigmoid colon, in                            the descending colon and in the ascending colon.                           - Internal hemorrhoids. Recommendation:           - Patient has a contact number available for                            emergencies. The signs and symptoms of potential                            delayed complications were discussed with the                            patient. Return to normal activities tomorrow.                            Written discharge instructions were provided to the                            patient.                           - Resume previous diet.                           - Continue present medications.                           - Resume Eliquis (apixaban) at prior dose today.                            Refer to managing physician for further adjustment                            of therapy.                           - Await pathology results.                           - Repeat colonoscopy is recommended for                            surveillance. The colonoscopy date will be                            determined after pathology results from today's                            exam become available for review. Ulice Dash  Everitt Amber, MD 10/05/2022 10:38:37 AM This report has been signed electronically.

## 2022-10-05 NOTE — Op Note (Addendum)
[place] Patient Name: Tony Cantu Procedure Date: 10/05/2022 10:05 AM MRN: [id number] Endoscopist: Jerene Bears , MD, [id number] Age: 72 Referring MD:  Date of Birth: 07-20-1951 Gender: Male Account #: [id number] Procedure:                Upper GI endoscopy Indications:              Heme positive stool, Cirrhosis rule out esophageal                            varices Medicines:                Monitored Anesthesia Care Procedure:                Pre-Anesthesia Assessment:                           - Prior to the procedure, a History and Physical                            was performed, and patient medications and                            allergies were reviewed. The patient's tolerance of                            previous anesthesia was also reviewed. The risks                            and benefits of the procedure and the sedation                            options and risks were discussed with the patient.                            All questions were answered, and informed consent                            was obtained. Prior Anticoagulants: The patient has                            taken Eliquis (apixaban), last dose was 2 days                            prior to procedure. ASA Grade Assessment: III - A                            patient with severe systemic disease. After                            reviewing the risks and benefits, the patient was                            deemed in satisfactory condition to undergo the  procedure.                           After obtaining informed consent, the endoscope was                            passed under direct vision. Throughout the                            procedure, the patient's blood pressure, pulse, and                            oxygen saturations were monitored continuously. The                            Olympus Scope [id number] was introduced through the                             mouth, and advanced to the second part of duodenum.                            The upper GI endoscopy was accomplished without                            difficulty. The patient tolerated the procedure                            well. Scope In: Scope Out: Findings:                 The examined esophagus was normal. No esophageal                            varices.                           A single 4 mm angioectasia with no bleeding was                            found in the gastric body.                           Moderate portal hypertensive gastropathy was found                            in the gastric fundus and in the gastric body.                           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                           The examined duodenum was normal. Complications:  No immediate complications. Estimated Blood Loss:     Estimated blood loss: none. Impression:               - Normal esophagus.                           - No esophageal or gastric varices.                           - A single non-bleeding angioectasia in the stomach.                           - Portal hypertensive gastropathy.                           - Mild Gastric antral vascular ectasia without                            bleeding.                           - Normal examined duodenum.                           - Heme + stools could result from either or                            combination of (angioectasia, portal gastropathy or                            GAVE)                           - No specimens collected. Recommendation:           - Patient has a contact number available for                            emergencies. The signs and symptoms of potential                            delayed complications were discussed with the                            patient. Return to normal activities tomorrow.                             Written discharge instructions were provided to the                            patient.                           - Resume previous diet.                           - Continue present medications.                           -  If anemia or IDA occur then EGD in the outpatient                            hospital setting recommended given availability of                            APC for ablation of angioectasia and GAVE.                           - Repeat EGD in recommended in 2 years at [facility] (this recall should replace older EGD recall). Jerene Bears, MD 10/05/2022 10:35:37 AM This report has been signed electronically.

## 2022-10-05 NOTE — Progress Notes (Signed)
Called to room to assist during endoscopic procedure.  Patient ID and intended procedure confirmed with present staff. Received instructions for my participation in the procedure from the performing physician.  

## 2022-10-05 NOTE — Progress Notes (Signed)
Report to PACU, RN, vss, BBS= Clear.  

## 2022-10-05 NOTE — Patient Instructions (Addendum)
Please read handouts provided. Continue present medications. Await pathology results. Resume Eliquis at prior dose today.  YOU HAD AN ENDOSCOPIC PROCEDURE TODAY AT [facility]:   Refer to the procedure report that was given to you for any specific questions about what was found during the examination.  If the procedure report does not answer your questions, please call your gastroenterologist to clarify.  If you requested that your care partner not be given the details of your procedure findings, then the procedure report has been included in a sealed envelope for you to review at your convenience later.  YOU SHOULD EXPECT: Some feelings of bloating in the abdomen. Passage of more gas than usual.  Walking can help get rid of the air that was put into your GI tract during the procedure and reduce the bloating. If you had a lower endoscopy (such as a colonoscopy or flexible sigmoidoscopy) you may notice spotting of blood in your stool or on the toilet paper. If you underwent a bowel prep for your procedure, you may not have a normal bowel movement for a few days.  Please Note:  You might notice some irritation and congestion in your nose or some drainage.  This is from the oxygen used during your procedure.  There is no need for concern and it should clear up in a day or so.  SYMPTOMS TO REPORT IMMEDIATELY:  Following lower endoscopy (colonoscopy or flexible sigmoidoscopy):  Excessive amounts of blood in the stool  Significant tenderness or worsening of abdominal pains  Swelling of the abdomen that is new, acute  Fever of 100F or higher  Following upper endoscopy (EGD)  Vomiting of blood or coffee ground material  New chest pain or pain under the shoulder blades  Painful or persistently difficult swallowing  New shortness of breath  Fever of 100F or higher  Black, tarry-looking stools  For urgent or emergent issues, a gastroenterologist can be reached at any hour by  calling [phone]. Do not use MyChart messaging for urgent concerns.    DIET:  We do recommend a small meal at first, but then you may proceed to your regular diet.  Drink plenty of fluids but you should avoid alcoholic beverages for 24 hours.  ACTIVITY:  You should plan to take it easy for the rest of today and you should NOT DRIVE or use heavy machinery until tomorrow (because of the sedation medicines used during the test).    FOLLOW UP: Our staff will call the number listed on your records the next business day following your procedure.  We will call around 7:15- 8:00 am to check on you and address any questions or concerns that you may have regarding the information given to you following your procedure. If we do not reach you, we will leave a message.     If any biopsies were taken you will be contacted by phone or by letter within the next 1-3 weeks.  Please call us at [phone] if you have not heard about the biopsies in 3 weeks.    SIGNATURES/CONFIDENTIALITY: You and/or your care partner have signed paperwork which will be entered into your electronic medical record.  These signatures attest to the fact that that the information above on your After Visit Summary has been reviewed and is understood.  Full responsibility of the confidentiality of this discharge information lies with you and/or your care-partner.

## 2022-10-05 NOTE — Progress Notes (Signed)
Pt's states no medical or surgical changes since previsit or office visit. 

## 2022-10-06 ENCOUNTER — Telehealth: Payer: Self-pay | Admitting: *Deleted

## 2022-10-06 NOTE — Telephone Encounter (Signed)
  Follow up Call-     10/05/2022    9:43 AM 07/07/2021   10:01 AM  Call back number  Post procedure Call Back phone  # [phone]  Permission to leave phone message Yes Yes     Patient questions:  Do you have a fever, pain , or abdominal swelling? No. Pain Score  0 *  Have you tolerated food without any problems? Yes.    Have you been able to return to your normal activities? Yes.    Do you have any questions about your discharge instructions: Diet   No. Medications  No. Follow up visit  No.  Do you have questions or concerns about your Care? No.  Actions: * If pain score is 4 or above: No action needed, pain <4.

## 2022-10-10 ENCOUNTER — Encounter: Payer: Self-pay | Admitting: Internal Medicine

## 2022-10-10 ENCOUNTER — Encounter: Payer: Self-pay | Admitting: Family Medicine

## 2022-10-11 ENCOUNTER — Encounter: Payer: Self-pay | Admitting: Internal Medicine

## 2022-11-04 DIAGNOSIS — N182 Chronic kidney disease, stage 2 (mild): Secondary | ICD-10-CM | POA: Diagnosis not present

## 2022-11-04 DIAGNOSIS — E1122 Type 2 diabetes mellitus with diabetic chronic kidney disease: Secondary | ICD-10-CM | POA: Diagnosis not present

## 2022-11-04 DIAGNOSIS — Z794 Long term (current) use of insulin: Secondary | ICD-10-CM | POA: Diagnosis not present

## 2022-11-08 ENCOUNTER — Other Ambulatory Visit: Payer: Self-pay | Admitting: Family Medicine

## 2022-11-16 ENCOUNTER — Telehealth: Payer: Self-pay | Admitting: Family Medicine

## 2022-11-16 MED ORDER — APIXABAN 5 MG PO TABS: 5.0000 mg | ORAL_TABLET | Freq: Two times a day (BID) | ORAL | 3 refills | Status: DC

## 2022-11-16 MED ORDER — VALSARTAN 40 MG PO TABS: 40.0000 mg | ORAL_TABLET | Freq: Every day | ORAL | 1 refills | Status: DC

## 2022-11-16 NOTE — Telephone Encounter (Signed)
Sent refills to [facility] left patient message to call back

## 2022-11-28 ENCOUNTER — Encounter: Payer: Self-pay | Admitting: Family

## 2022-11-28 ENCOUNTER — Ambulatory Visit (INDEPENDENT_AMBULATORY_CARE_PROVIDER_SITE_OTHER): Payer: BC Managed Care – PPO | Admitting: Family

## 2022-11-28 VITALS — BP 103/63 | HR 80 | Temp 98.4°F | Ht 68.0 in | Wt 213.6 lb

## 2022-11-28 DIAGNOSIS — H6122 Impacted cerumen, left ear: Secondary | ICD-10-CM | POA: Diagnosis not present

## 2022-11-28 DIAGNOSIS — H938X3 Other specified disorders of ear, bilateral: Secondary | ICD-10-CM

## 2022-11-28 NOTE — Progress Notes (Signed)
Subjective:    Patient ID: Tony Cantu, male    DOB: October 25, 1950, 72 y.o.   MRN: [id number]  Chief Complaint  Patient presents with   Ear Pain   Dizziness   PT presents to the office today with left ear pain and dizziness that started two months ago.  Dizziness This is a new problem. Associated symptoms include coughing. Pertinent negatives include no headaches or sore throat.  Otalgia  There is pain in the left ear. This is a new problem. The current episode started more than 1 month ago. The problem occurs every few minutes. The problem has been waxing and waning. There has been no fever. The pain is at a severity of 2/10. The pain is mild. Associated symptoms include coughing and hearing loss. Pertinent negatives include no diarrhea, ear discharge, headaches, rhinorrhea or sore throat. He has tried acetaminophen for the symptoms. The treatment provided mild relief.      Review of Systems  HENT:  Positive for ear pain and hearing loss. Negative for ear discharge, rhinorrhea and sore throat.   Respiratory:  Positive for cough.   Gastrointestinal:  Negative for diarrhea.  Neurological:  Positive for dizziness. Negative for headaches.  All other systems reviewed and are negative.      Objective:   Physical Exam Vitals reviewed.  Constitutional:      General: He is not in acute distress.    Appearance: He is well-developed.  HENT:     Head: Normocephalic.     Right Ear: Tympanic membrane normal.     Left Ear: There is impacted cerumen.  Eyes:     General:        Right eye: No discharge.        Left eye: No discharge.     Pupils: Pupils are equal, round, and reactive to light.  Neck:     Thyroid: No thyromegaly.  Cardiovascular:     Rate and Rhythm: Normal rate and regular rhythm.     Heart sounds: Normal heart sounds. No murmur heard. Pulmonary:     Effort: Pulmonary effort is normal. No respiratory distress.     Breath sounds: Normal breath sounds. No wheezing.   Abdominal:     General: Bowel sounds are normal. There is no distension.     Palpations: Abdomen is soft.     Tenderness: There is no abdominal tenderness.  Musculoskeletal:        General: No tenderness. Normal range of motion.     Cervical back: Normal range of motion and neck supple.  Skin:    General: Skin is warm and dry.     Findings: No erythema or rash.  Neurological:     Mental Status: He is alert and oriented to person, place, and time.     Cranial Nerves: No cranial nerve deficit.     Deep Tendon Reflexes: Reflexes are normal and symmetric.  Psychiatric:        Behavior: Behavior normal.        Thought Content: Thought content normal.        Judgment: Judgment normal.     Left ear washed in warm water and peroxide. TM WNL.  BP 103/63   Pulse 80   Temp 98.4 F (36.9 C) (Temporal)   Ht 5\' 8"  (1.727 m)   Wt 213 lb 9.6 oz (96.9 kg)   SpO2 97%   BMI 32.48 kg/m      Assessment & Plan:  Tony Cantu  comes in today with chief complaint of Ear Pain and Dizziness   Diagnosis and orders addressed:  1. Sensation of fullness in both ears  2. Impacted cerumen of left ear  Debrox as needed Keep clean and dry  TM WNL Keep follow up with PCP   Evelina Dun, FNP

## 2022-11-28 NOTE — Patient Instructions (Signed)

## 2022-12-01 DIAGNOSIS — M1712 Unilateral primary osteoarthritis, left knee: Secondary | ICD-10-CM | POA: Diagnosis not present

## 2022-12-01 DIAGNOSIS — M1711 Unilateral primary osteoarthritis, right knee: Secondary | ICD-10-CM | POA: Diagnosis not present

## 2022-12-01 DIAGNOSIS — M170 Bilateral primary osteoarthritis of knee: Secondary | ICD-10-CM | POA: Diagnosis not present

## 2022-12-04 DIAGNOSIS — Z794 Long term (current) use of insulin: Secondary | ICD-10-CM | POA: Diagnosis not present

## 2022-12-04 DIAGNOSIS — N182 Chronic kidney disease, stage 2 (mild): Secondary | ICD-10-CM | POA: Diagnosis not present

## 2022-12-04 DIAGNOSIS — E1122 Type 2 diabetes mellitus with diabetic chronic kidney disease: Secondary | ICD-10-CM | POA: Diagnosis not present

## 2022-12-16 ENCOUNTER — Encounter: Payer: Self-pay | Admitting: Family Medicine

## 2022-12-26 ENCOUNTER — Encounter: Payer: Self-pay | Admitting: Family Medicine

## 2022-12-26 ENCOUNTER — Ambulatory Visit (INDEPENDENT_AMBULATORY_CARE_PROVIDER_SITE_OTHER): Payer: BC Managed Care – PPO | Admitting: Family Medicine

## 2022-12-26 VITALS — BP 100/76 | HR 80 | Temp 98.6°F | Ht 68.0 in | Wt 210.0 lb

## 2022-12-26 DIAGNOSIS — Z794 Long term (current) use of insulin: Secondary | ICD-10-CM

## 2022-12-26 DIAGNOSIS — E1169 Type 2 diabetes mellitus with other specified complication: Secondary | ICD-10-CM

## 2022-12-26 DIAGNOSIS — J019 Acute sinusitis, unspecified: Secondary | ICD-10-CM | POA: Diagnosis not present

## 2022-12-26 DIAGNOSIS — N182 Chronic kidney disease, stage 2 (mild): Secondary | ICD-10-CM | POA: Diagnosis not present

## 2022-12-26 DIAGNOSIS — H6121 Impacted cerumen, right ear: Secondary | ICD-10-CM | POA: Diagnosis not present

## 2022-12-26 DIAGNOSIS — E1159 Type 2 diabetes mellitus with other circulatory complications: Secondary | ICD-10-CM

## 2022-12-26 DIAGNOSIS — E1122 Type 2 diabetes mellitus with diabetic chronic kidney disease: Secondary | ICD-10-CM | POA: Diagnosis not present

## 2022-12-26 DIAGNOSIS — F5101 Primary insomnia: Secondary | ICD-10-CM | POA: Diagnosis not present

## 2022-12-26 DIAGNOSIS — I152 Hypertension secondary to endocrine disorders: Secondary | ICD-10-CM

## 2022-12-26 DIAGNOSIS — E785 Hyperlipidemia, unspecified: Secondary | ICD-10-CM | POA: Diagnosis not present

## 2022-12-26 DIAGNOSIS — B9689 Other specified bacterial agents as the cause of diseases classified elsewhere: Secondary | ICD-10-CM

## 2022-12-26 MED ORDER — CEFDINIR 300 MG PO CAPS
300.0000 mg | ORAL_CAPSULE | Freq: Two times a day (BID) | ORAL | 0 refills | Status: DC
Start: 2022-12-26 — End: 2023-06-08

## 2022-12-26 MED ORDER — BENZONATATE 100 MG PO CAPS
100.0000 mg | ORAL_CAPSULE | Freq: Three times a day (TID) | ORAL | 0 refills | Status: DC | PRN
Start: 2022-12-26 — End: 2023-06-08

## 2022-12-26 MED ORDER — RAMELTEON 8 MG PO TABS
8.0000 mg | ORAL_TABLET | Freq: Every day | ORAL | 3 refills | Status: DC
Start: 2022-12-26 — End: 2023-06-08

## 2022-12-26 NOTE — Progress Notes (Unsigned)
Subjective: CC:DM PCP: [name], DO RUE:[id number] W Gondek is a 72 y.o. male presenting to clinic today for:  1. Type 2 Diabetes with hypertension, hyperlipidemia:  He brings his CGM today and notes that he has had several low blood sugars since our last visit.  He has lost some weight since his surgery and wonders if perhaps that is impacting.  Currently injecting 56 units of Toujeo daily and roughly 28 units with meals of the Humalog.  The hypoglycemic episodes he has been experiencing tend occur overnight and will wake him up.  Average blood sugar has been 125  Last eye exam: UTd Last foot exam: UTD Last A1c:  Lab Results  Component Value Date   HGBA1C 5.1 08/24/2022   Nephropathy screen indicated?: UTD Last flu, zoster and/or pneumovax:  Immunization History  Administered Date(s) Administered   Fluad Quad(high Dose 65+) 05/17/2019, 05/19/2020, 07/06/2021   Influenza Split 06/05/2013   Influenza Whole 06/15/2010, 06/16/2011, 05/29/2012   Influenza, High Dose Seasonal PF 06/25/2015, 06/25/2015, 06/12/2017   Influenza,inj,Quad PF,6+ Mos 06/05/2013, 06/17/2014, 06/07/2016, 05/31/2022   Influenza-Unspecified 07/04/2008, 06/17/2014, 06/25/2015, 06/12/2017, 05/22/2018   Moderna SARS-COV2 Booster Vaccination 08/26/2020   Moderna Sars-Covid-2 Vaccination 11/07/2019, 12/05/2019   Pneumococcal Conjugate-13 06/05/2013   Pneumococcal Polysaccharide-23 06/03/2011, 07/04/2011, 09/16/2019   Tdap 08/30/2007, 12/27/2017   Zoster Recombinat (Shingrix) 01/05/2022, 04/13/2022   Zoster, Live 02/01/2012    ROS: No chest pain or shortness of breath reported he has had a productive cough for a while now.  This has colored sputum.  It is refractory to allergy and OTC medications.  No known sick contacts reported.  He does report bilateral ear fullness and feels like he might need his ears cleaned out  2.  Insomnia Continues to struggle with insomnia despite use of trazodone.  Zanaflex that  was given for his surgery made him feel too sedated the following few days so he does not want to be on anything like that.  His wife is on something but he does not recall what it is.  It seems to work well for her.   ROS: Per HPI  Allergies  Allergen Reactions   Dimetapp Children's Cold-Cough     Chest discomfort    Erythromycin Diarrhea   Sulfonamide Derivatives Diarrhea   Past Medical History:  Diagnosis Date   Aortic atherosclerosis (HCC)    Aortic stenosis    Arthritis    Asthma    BPH (benign prostatic hypertrophy)    Cirrhosis (HCC) 2016   Colon polyps    Diabetes mellitus without complication (HCC)    TYPE 1    Diverticulosis    Dysrhythmia 04/19/2021   A-fib noted with possible A-fib RVR   Gastric ulcer    Gastric varices    right   Gastritis    GERD (gastroesophageal reflux disease)    Headache    History- pt states these were migraines that occurred in the 1980's   Heart murmur    History of kidney stones    Hx of adenomatous colonic polyps    Hypertension    PONV (postoperative nausea and vomiting)    pt states he has never had post op nausea or vomiting   Portal hypertensive gastropathy (HCC)    Renal cyst, right    Seizures (HCC)    HYPOGLYCEMIC LAST 1 AND 1/2 YRS AGO   Sleep apnea    uses CPAP nightly   Testicle trouble    one testicle BORN WITH  Tubular adenoma of colon     Current Outpatient Medications:    acetaminophen (TYLENOL) 500 MG tablet, Take 1,000 mg by mouth every 6 (six) hours as needed for moderate pain., Disp: , Rfl:    apixaban (ELIQUIS) 5 MG TABS tablet, Take 1 tablet (5 mg total) by mouth 2 (two) times daily., Disp: 180 tablet, Rfl: 3   atorvastatin (LIPITOR) 40 MG tablet, TAKE 1 TABLET BY MOUTH DAILY (Patient taking differently: Take 40 mg by mouth at bedtime.), Disp: 90 tablet, Rfl: 3   b complex vitamins tablet, Take 1 tablet by mouth daily., Disp: , Rfl:    Blood Glucose Monitoring Suppl (CONTOUR NEXT ONE) KIT, Test BS QID  and as needed Dx E11.9, Disp: 500 kit, Rfl: 3   Cholecalciferol (DIALYVITE VITAMIN D 5000) 125 MCG (5000 UT) capsule, Take 5,000 Units by mouth daily., Disp: , Rfl:    Chromium Picolinate (CHROMIUM PICOLATE PO), Take 1 tablet by mouth daily., Disp: , Rfl:    CINNAMON PO, Take 1,080 mg by mouth daily., Disp: , Rfl:    Coenzyme Q10 (COQ10) 100 MG CAPS, Take 100 mg by mouth daily., Disp: , Rfl:    Continuous Blood Gluc Sensor (FREESTYLE LIBRE SENSOR SYSTEM) MISC, Check BS eight (8) times a day. Dx E10.9, Disp: 7 each, Rfl: 3   diltiazem (CARDIZEM) 30 MG tablet, TAKE 1 TABLET BY MOUTH  TWICE DAILY, Disp: 180 tablet, Rfl: 3   furosemide (LASIX) 20 MG tablet, TAKE 1 AND 1/2 TABLETS BY MOUTH  DAILY (Patient taking differently: Take 20 mg by mouth daily.), Disp: 135 tablet, Rfl: 3   Glucagon, rDNA, (GLUCAGON EMERGENCY) 1 MG KIT, INJECT AS DIRECTED INTO  UPPER ARM, THIGH OR  BUTTOCKS AS NEEDED FOR  SEVERE HYPOGLYCEMIA. SEEK  MEDICAL ATTENTION AFTER USE (Patient taking differently: Inject 1 mg into the muscle as needed (HYPOGLYCEMIA).), Disp: 3 kit, Rfl: 0   Glucosamine-Chondroit-Vit C-Mn (GLUCOSAMINE 1500 COMPLEX) CAPS, Take 2 capsules by mouth daily., Disp: , Rfl:    glucose blood (CONTOUR NEXT TEST) test strip, Test BS QID and as needed Dx E11.9, Disp: 400 strip, Rfl: 3   insulin lispro (HUMALOG) 100 UNIT/ML injection, INJECT SUBCUTANEOUSLY 20 TO 50  UNITS 3 TIMES DAILY BEFORE MEALS PER SLIDING SCALE (Patient taking differently: Inject 10-30 Units into the skin See admin instructions. Inject 10-30 units up to 7 times daily as needed for high blood sugar), Disp: 140 mL, Rfl: 3   Magnesium 250 MG TABS, Take 250 mg by mouth daily., Disp: , Rfl:    METAMUCIL FIBER PO, Take 1 capsule by mouth 2 (two) times daily., Disp: , Rfl:    Microlet Lancets MISC, Test BS QID and as needed Dx E11.9, Disp: 500 each, Rfl: 3   montelukast (SINGULAIR) 10 MG tablet, TAKE 1 TABLET BY MOUTH AT  BEDTIME, Disp: 90 tablet, Rfl: 3    Multiple Vitamins-Minerals (EYE HEALTH) CAPS, Take 1 capsule by mouth daily., Disp: , Rfl:    oxyCODONE (OXY IR/ROXICODONE) 5 MG immediate release tablet, Take 5 mg by mouth 2 (two) times daily as needed., Disp: , Rfl:    pantoprazole (PROTONIX) 40 MG tablet, TAKE 1 TABLET BY MOUTH TWICE  DAILY BEFORE MEALS, Disp: 180 tablet, Rfl: 3   potassium chloride SA (KLOR-CON M) 20 MEQ tablet, Take 2 tablets (40 mEq total) by mouth 2 (two) times daily., Disp: 10 tablet, Rfl: 0   tiZANidine (ZANAFLEX) 2 MG tablet, Take 2 mg by mouth every 8 (eight) hours as  needed for muscle spasms., Disp: , Rfl:    TOUJEO MAX SOLOSTAR 300 UNIT/ML Solostar Pen, INJECT SUBCUTANEOUSLY 110 UNITS  DAILY (Patient taking differently: Inject 70 Units into the skin at bedtime.), Disp: 36 mL, Rfl: 3   traZODone (DESYREL) 50 MG tablet, Take 0.5-1 tablets (25-50 mg total) by mouth at bedtime as needed for sleep., Disp: 30 tablet, Rfl: 3   Turmeric Curcumin 500 MG CAPS, Take 500 mg by mouth daily., Disp: , Rfl:    valsartan (DIOVAN) 40 MG tablet, Take 1 tablet (40 mg total) by mouth daily., Disp: 90 tablet, Rfl: 1 Social History   Socioeconomic History   Marital status: Married    Spouse name: Not on file   Number of children: 1   Years of education: Not on file   Highest education level: Not on file  Occupational History   Occupation: retired   Tobacco Use   Smoking status: Former    Years: 34    Types: Cigarettes, Pipe    Quit date: 2005    Years since quitting: 19.3   Smokeless tobacco: Never   Tobacco comments:    quit 2005 smoked cigarettes for 5 yrs prior to pipe use  Vaping Use   Vaping Use: Never used  Substance and Sexual Activity   Alcohol use: Not Currently   Drug use: No   Sexual activity: Yes  Other Topics Concern   Not on file  Social History Narrative   ** Merged History Encounter **    Right handed    Social Determinants of Health   Financial Resource Strain: Low Risk  (06/09/2022)   Overall  Financial Resource Strain (CARDIA)    Difficulty of Paying Living Expenses: Not hard at all  Food Insecurity: No Food Insecurity (06/06/2022)   Hunger Vital Sign    Worried About Running Out of Food in the Last Year: Never true    Ran Out of Food in the Last Year: Never true  Transportation Needs: No Transportation Needs (06/09/2022)   PRAPARE - Administrator, Civil Service (Medical): No    Lack of Transportation (Non-Medical): No  Physical Activity: Not on file  Stress: Not on file  Social Connections: Not on file  Intimate Partner Violence: Not At Risk (06/06/2022)   Humiliation, Afraid, Rape, and Kick questionnaire    Fear of Current or Ex-Partner: No    Emotionally Abused: No    Physically Abused: No    Sexually Abused: No   Family History  Problem Relation Age of Onset   Heart failure Mother    Heart attack Mother        in her 48's   Alzheimer's disease Mother    Diabetes Mother    Asthma Father    Suicidality Father        in his 13's   Allergic rhinitis Sister    Heart failure Maternal Grandmother    Rheum arthritis Maternal Grandmother    Breast cancer Maternal Grandmother    Heart attack Maternal Grandmother        in her 60's   Colon cancer Neg Hx    Esophageal cancer Neg Hx    Pancreatic cancer Neg Hx    Liver disease Neg Hx     Objective: Office vital signs reviewed. BP 100/76   Pulse 80   Temp 98.6 F (37 C)   Ht 5\' 8"  (1.727 m)   Wt 210 lb (95.3 kg)   SpO2 95%   BMI  31.93 kg/m   Physical Examination:  General: Awake, alert, well nourished, No acute distress HEENT: Normal    Neck: No masses palpated. No lymphadenopathy    Ears: Tympanic membranes intact.  Right TM is somewhat obscured by cerumen but left TM well-visualized.  Cerumen is present however in that external auditory canal on the left as well    Eyes: PERRLA, extraocular membranes intact, sclera white    Nose: nasal turbinates moist, clear nasal discharge    Throat: moist  mucus membranes, moderate oropharyngeal erythema, no tonsillar exudate.  Airway is patent Cardio: Irregularly irregular with rate control, S1S2 heard, systolic murmurs appreciated Pulm: clear to auscultation bilaterally, no wheezes, rhonchi or rales; normal work of breathing on room air    Assessment/ Plan: 72 y.o. male   Controlled type 2 diabetes mellitus with stage 2 chronic kidney disease, with long-term current use of insulin (HCC) - Plan: Bayer DCA Hb A1c Waived  Hypertension associated with type 2 diabetes mellitus (HCC) - Plan: CMP14+EGFR  Primary insomnia - Plan: ramelteon (ROZEREM) 8 MG tablet  Acute bacterial sinusitis - Plan: cefdinir (OMNICEF) 300 MG capsule, benzonatate (TESSALON PERLES) 100 MG capsule  Impacted cerumen of right ear  A1c to be collected with next lab draw with specialist.  Based on review of his CGM, I encouraged him to back off on his short-acting suppertime insulin as this seems to be when he is having his hypoglycemic episodes.  He has lost some weight so I think his insulin needs are even less than previous.  He may continue long-acting as directed for now.  I would like to reassess him closely.  Blood pressure is well-controlled.  No changes  Ros a room sent in for insomnia as symptoms are refractory to trazodone.  We could at some point may be consider something like mirtazapine.  Though I would worry that that might precipitate worsening atrial fibrillation rates.  Alternatively, could consider something like Ambien.  I certainly do not think he is a good candidate for tricyclic antidepressant due to A-fib and potential for QTc prolongation.  Bilateral ears with quite a bit of cerumen noted.  Irrigation successful.  Omnicef ordered for what sounds like acute bacterial sinusitis as well as Tessalon Perles sent for cough.  Follow-up as needed on that issue  No orders of the defined types were placed in this encounter.  No orders of the defined types  were placed in this encounter.    [name], DO [facility] [phone]

## 2022-12-26 NOTE — Patient Instructions (Signed)
Sent Ramelteon in.  This is safer than Ambien but we can visit ambien if this is ineffective.

## 2023-01-03 DIAGNOSIS — N182 Chronic kidney disease, stage 2 (mild): Secondary | ICD-10-CM | POA: Diagnosis not present

## 2023-01-03 DIAGNOSIS — Z794 Long term (current) use of insulin: Secondary | ICD-10-CM | POA: Diagnosis not present

## 2023-01-03 DIAGNOSIS — E1122 Type 2 diabetes mellitus with diabetic chronic kidney disease: Secondary | ICD-10-CM | POA: Diagnosis not present

## 2023-01-03 NOTE — Progress Notes (Unsigned)
[facility] [address], [place] [address]   CLINIC:  Medical Oncology/Hematology  PCP:  [name], DO [address] [place] [address] [phone]   REASON FOR VISIT:  Follow-up for thrombocytopenia   PRIOR THERAPY: None   CURRENT THERAPY: Surveillance  INTERVAL HISTORY:   Mr. Tony Cantu 72 y.o. male returns for routine follow-up of his thrombocytopenia.  He was last seen by Rojelio Brenner PA-C on 07/05/2022.   At today's visit, he reports feeling well.***  He denies any recent hospitalizations, surgeries, new diagnoses, or changes in his baseline health status.  *** He continues to deny any major bleeding episodes.  *** *** He does report some easy bruising but denies any petechial rash. *** No fever, chills, night sweats, unintentional weight loss. He has ***% energy and ***% appetite. He endorses that he is maintaining a stable weight.   ASSESSMENT & PLAN:  1.  Mild thrombocytopenia, likely secondary to cirrhosis -  Seen at the request of Dr. Nadine Counts for mild thrombocytopenia since September 2020. - No history of bleeding.  Mild bruising.  Denies any B symptoms. - Patient started on Eliquis for paroxysmal atrial fibrillation since mid January 2023. - No prior history of blood transfusions. - No history of RA, lupus, or ulcerative colitis/Crohn's disease. - CT abdomen on 04/28/2021 with cirrhosis.  Spleen size normal. - His previous lab reports indicate possible clumping of platelets contributing to mild thrombocytopenia. - Hematology work-up (12/10/2021): Pathology smear review confirms mild thrombocytopenia. Negative hepatitis B and C.  Negative H. pylori. Normal rheumatoid factor, ANA, LDH. No nutritional deficiencies.  Normal B12, folate, methylmalonic acid, copper. SPEP and reticulocytes within normal limits. - CBC today *** - We discussed differential diagnosis for thrombocytopenia.  Etiology favors liver disease, but could  also be immune mediated. - PLAN: No indication for treatment at this time.*** - Repeat labs (CBC/D) with same-day RTC in 6 months. *** - If any significant changes in baseline blood counts, would consider bone marrow biopsy. - If platelets less than 30, would consider treatment, including trial of steroids to see if there is any improvement.   2.  Social/family history: - He retired from working at a [employer].  No chemicals exposure.  Quit smoking pipe in 2005.  He smoked pipe for 35 years. - No family history of thrombocytopenia.  Maternal grandmother had breast cancer.  Mother could have had intestinal cancer when she had part of intestine due to resected.  PLAN SUMMARY: >> *** >> *** >> ***   [facility] **VISIT SUMMARY & IMPORTANT INSTRUCTIONS **   You were seen today by Rojelio Brenner PA-C for your ***.    *** ***  *** ***  LABS: Return in ***   OTHER TESTS: ***  MEDICATIONS: ***  FOLLOW-UP APPOINTMENT: ***     REVIEW OF SYSTEMS: ***  Review of Systems - Oncology   PHYSICAL EXAM:  ECOG PERFORMANCE STATUS: {CHL ONC ECOG GN:[id number]} *** There were no vitals filed for this visit. There were no vitals filed for this visit. Physical Exam  PAST MEDICAL/SURGICAL HISTORY:  Past Medical History:  Diagnosis Date   Aortic atherosclerosis (HCC)    Aortic stenosis    Arthritis    Asthma    BPH (benign prostatic hypertrophy)    Cirrhosis (HCC) 2016   Colon polyps    Diabetes mellitus without complication (HCC)    TYPE 1    Diverticulosis  Dysrhythmia 04/19/2021   A-fib noted with possible A-fib RVR   Gastric ulcer    Gastric varices    right   Gastritis    GERD (gastroesophageal reflux disease)    Headache    History- pt states these were migraines that occurred in the 1980's   Heart murmur    History of kidney stones    Hx of adenomatous colonic polyps    Hypertension    PONV (postoperative nausea and  vomiting)    pt states he has never had post op nausea or vomiting   Portal hypertensive gastropathy (HCC)    Renal cyst, right    Seizures (HCC)    HYPOGLYCEMIC LAST 1 AND 1/2 YRS AGO   Sleep apnea    uses CPAP nightly   Testicle trouble    one testicle BORN WITH   Tubular adenoma of colon    Past Surgical History:  Procedure Laterality Date   BACK SURGERY     89  LOWER    CARPAL TUNNEL RELEASE Right 10/13/2015   Procedure: RIGHT CARPAL TUNNEL RELEASE;  Surgeon: Cindee Salt, MD;  Location: [facility];  Service: Orthopedics;  Laterality: Right;   CARPAL TUNNEL RELEASE Left 07/05/2016   Procedure: LEFT CARPAL TUNNEL RELEASE;  Surgeon: Cindee Salt, MD;  Location: [facility];  Service: Orthopedics;  Laterality: Left;   COLONOSCOPY WITH ESOPHAGOGASTRODUODENOSCOPY (EGD)  10/05/2022   DRUG INDUCED ENDOSCOPY N/A 07/02/2021   Procedure: DRUG INDUCED SLEEP ENDOSCOPY;  Surgeon: Christia Reading, MD;  Location: [facility] OR;  Service: ENT;  Laterality: N/A;   IMPLANTATION OF HYPOGLOSSAL NERVE STIMULATOR Right 10/01/2021   Procedure: IMPLANTATION OF HYPOGLOSSAL NERVE STIMULATOR;  Surgeon: Christia Reading, MD;  Location: [facility] OR;  Service: ENT;  Laterality: Right;   INGUINAL HERNIA REPAIR  2003   right    KNEE ARTHROSCOPY Right 03/03/2016   LUMBAR DISC SURGERY  03/1995   Dr. Daleen Squibb, discectomy   LUMBAR LAMINECTOMY/DECOMPRESSION MICRODISCECTOMY Right 10/06/2016   Procedure: RIGHT LUMBAR THREE - LUMBAR FOUR  LAMINECTOMY, FORAMINOTOMY AND MICRODISCECTOMY;  Surgeon: Shirlean Kelly, MD;  Location: [facility] OR;  Service: Neurosurgery;  Laterality: Right;   SHOULDER SURGERY  11/28/2005   left partial   SHOULDER SURGERY  07/14/2006   RIGHT   TONSILLECTOMY  AGE 41 OR 4   TOTAL KNEE ARTHROPLASTY Right 03/07/2022   Procedure: RIGHT TOTAL KNEE ARTHROPLASTY;  Surgeon: Gean Birchwood, MD;  Location: [facility];  Service: Orthopedics;  Laterality: Right;   TOTAL KNEE ARTHROPLASTY Left 06/06/2022    Procedure: LEFT TOTAL KNEE ARTHROPLASTY;  Surgeon: Gean Birchwood, MD;  Location: [facility];  Service: Orthopedics;  Laterality: Left;   TOTAL SHOULDER ARTHROPLASTY Right 11/01/2018   Procedure: RIGHT SHOULDER REVISION TO REVERSE TOTAL SHOULDER;  Surgeon: Jones Broom, MD;  Location: [facility];  Service: Orthopedics;  Laterality: Right;  CHOICE ANESTHESIA WITH INTERSCALENE BLOCK EXPAREL, NEEDS RNFA    SOCIAL HISTORY:  Social History   Socioeconomic History   Marital status: Married    Spouse name: Not on file   Number of children: 1   Years of education: Not on file   Highest education level: Not on file  Occupational History   Occupation: retired   Tobacco Use   Smoking status: Former    Years: 34    Types: Cigarettes, Pipe    Quit date: 2005    Years since quitting: 19.3   Smokeless tobacco: Never   Tobacco comments:    quit 2005 smoked cigarettes for  5 yrs prior to pipe use  Vaping Use   Vaping Use: Never used  Substance and Sexual Activity   Alcohol use: Not Currently   Drug use: No   Sexual activity: Yes  Other Topics Concern   Not on file  Social History Narrative   ** Merged History Encounter **    Right handed    Social Determinants of Health   Financial Resource Strain: Low Risk  (06/09/2022)   Overall Financial Resource Strain (CARDIA)    Difficulty of Paying Living Expenses: Not hard at all  Food Insecurity: No Food Insecurity (06/06/2022)   Hunger Vital Sign    Worried About Running Out of Food in the Last Year: Never true    Ran Out of Food in the Last Year: Never true  Transportation Needs: No Transportation Needs (06/09/2022)   PRAPARE - Administrator, Civil Service (Medical): No    Lack of Transportation (Non-Medical): No  Physical Activity: Not on file  Stress: Not on file  Social Connections: Not on file  Intimate Partner Violence: Not At Risk (06/06/2022)   Humiliation, Afraid, Rape, and Kick questionnaire    Fear of Current or  Ex-Partner: No    Emotionally Abused: No    Physically Abused: No    Sexually Abused: No    FAMILY HISTORY:  Family History  Problem Relation Age of Onset   Heart failure Mother    Heart attack Mother        in her 17's   Alzheimer's disease Mother    Diabetes Mother    Asthma Father    Suicidality Father        in his 53's   Allergic rhinitis Sister    Heart failure Maternal Grandmother    Rheum arthritis Maternal Grandmother    Breast cancer Maternal Grandmother    Heart attack Maternal Grandmother        in her 61's   Colon cancer Neg Hx    Esophageal cancer Neg Hx    Pancreatic cancer Neg Hx    Liver disease Neg Hx     CURRENT MEDICATIONS:  Outpatient Encounter Medications as of 01/04/2023  Medication Sig   acetaminophen (TYLENOL) 500 MG tablet Take 1,000 mg by mouth every 6 (six) hours as needed for moderate pain.   apixaban (ELIQUIS) 5 MG TABS tablet Take 1 tablet (5 mg total) by mouth 2 (two) times daily.   atorvastatin (LIPITOR) 40 MG tablet TAKE 1 TABLET BY MOUTH DAILY (Patient taking differently: Take 40 mg by mouth at bedtime.)   b complex vitamins tablet Take 1 tablet by mouth daily.   benzonatate (TESSALON PERLES) 100 MG capsule Take 1 capsule (100 mg total) by mouth 3 (three) times daily as needed.   Blood Glucose Monitoring Suppl (CONTOUR NEXT ONE) KIT Test BS QID and as needed Dx E11.9   cefdinir (OMNICEF) 300 MG capsule Take 1 capsule (300 mg total) by mouth 2 (two) times daily. 1 po BID   Cholecalciferol (DIALYVITE VITAMIN D 5000) 125 MCG (5000 UT) capsule Take 5,000 Units by mouth daily.   Chromium Picolinate (CHROMIUM PICOLATE PO) Take 1 tablet by mouth daily.   CINNAMON PO Take 1,080 mg by mouth daily.   Coenzyme Q10 (COQ10) 100 MG CAPS Take 100 mg by mouth daily.   Continuous Blood Gluc Sensor (FREESTYLE LIBRE SENSOR SYSTEM) MISC Check BS eight (8) times a day. Dx E10.9   diltiazem (CARDIZEM) 30 MG tablet TAKE 1 TABLET BY  MOUTH  TWICE DAILY    furosemide (LASIX) 20 MG tablet TAKE 1 AND 1/2 TABLETS BY MOUTH  DAILY (Patient taking differently: Take 20 mg by mouth daily.)   Glucagon, rDNA, (GLUCAGON EMERGENCY) 1 MG KIT INJECT AS DIRECTED INTO  UPPER ARM, THIGH OR  BUTTOCKS AS NEEDED FOR  SEVERE HYPOGLYCEMIA. SEEK  MEDICAL ATTENTION AFTER USE (Patient taking differently: Inject 1 mg into the muscle as needed (HYPOGLYCEMIA).)   Glucosamine-Chondroit-Vit C-Mn (GLUCOSAMINE 1500 COMPLEX) CAPS Take 2 capsules by mouth daily.   glucose blood (CONTOUR NEXT TEST) test strip Test BS QID and as needed Dx E11.9   insulin lispro (HUMALOG) 100 UNIT/ML injection INJECT SUBCUTANEOUSLY 20 TO 50  UNITS 3 TIMES DAILY BEFORE MEALS PER SLIDING SCALE (Patient taking differently: Inject 10-30 Units into the skin See admin instructions. Inject 10-30 units up to 7 times daily as needed for high blood sugar)   Magnesium 250 MG TABS Take 250 mg by mouth daily.   METAMUCIL FIBER PO Take 1 capsule by mouth 2 (two) times daily.   Microlet Lancets MISC Test BS QID and as needed Dx E11.9   montelukast (SINGULAIR) 10 MG tablet TAKE 1 TABLET BY MOUTH AT  BEDTIME   Multiple Vitamins-Minerals (EYE HEALTH) CAPS Take 1 capsule by mouth daily.   oxyCODONE (OXY IR/ROXICODONE) 5 MG immediate release tablet Take 5 mg by mouth 2 (two) times daily as needed.   pantoprazole (PROTONIX) 40 MG tablet TAKE 1 TABLET BY MOUTH TWICE  DAILY BEFORE MEALS   potassium chloride SA (KLOR-CON M) 20 MEQ tablet Take 2 tablets (40 mEq total) by mouth 2 (two) times daily.   ramelteon (ROZEREM) 8 MG tablet Take 1 tablet (8 mg total) by mouth at bedtime.   tiZANidine (ZANAFLEX) 2 MG tablet Take 2 mg by mouth every 8 (eight) hours as needed for muscle spasms.   TOUJEO MAX SOLOSTAR 300 UNIT/ML Solostar Pen INJECT SUBCUTANEOUSLY 110 UNITS  DAILY (Patient taking differently: Inject 70 Units into the skin at bedtime.)   traZODone (DESYREL) 50 MG tablet Take 0.5-1 tablets (25-50 mg total) by mouth at bedtime as  needed for sleep.   Turmeric Curcumin 500 MG CAPS Take 500 mg by mouth daily.   valsartan (DIOVAN) 40 MG tablet Take 1 tablet (40 mg total) by mouth daily.   No facility-administered encounter medications on file as of 01/04/2023.    ALLERGIES:  Allergies  Allergen Reactions   Dimetapp Children's Cold-Cough     Chest discomfort    Erythromycin Diarrhea   Sulfonamide Derivatives Diarrhea    LABORATORY DATA:  I have reviewed the labs as listed.  CBC    Component Value Date/Time   WBC 6.9 08/03/2022 1106   RBC 4.33 08/03/2022 1106   HGB 15.4 08/03/2022 1106   HGB 17.1 11/03/2021 0842   HCT 44.9 08/03/2022 1106   HCT 49.9 11/03/2021 0842   PLT 115.0 (L) 08/03/2022 1106   PLT 100 (LL) 11/03/2021 0842   MCV 103.6 (H) 08/03/2022 1106   MCV 101 (H) 11/03/2021 0842   MCH 34.7 (H) 07/05/2022 0909   MCHC 34.2 08/03/2022 1106   RDW 16.7 (H) 08/03/2022 1106   RDW 12.7 11/03/2021 0842   LYMPHSABS 1.3 08/03/2022 1106   LYMPHSABS 1.6 12/29/2020 0807   MONOABS 1.2 (H) 08/03/2022 1106   EOSABS 0.1 08/03/2022 1106   EOSABS 0.6 (H) 12/29/2020 0807   BASOSABS 0.1 08/03/2022 1106   BASOSABS 0.1 12/29/2020 0807      Latest Ref Rng &  Units 08/11/2022    9:00 AM 08/03/2022   11:06 AM 05/24/2022    9:34 AM  CMP  Glucose 70 - 99 mg/dL 119  147  829   BUN 8 - 27 mg/dL 10  9  10    Creatinine 0.76 - 1.27 mg/dL 5.62  1.30  8.65   Sodium 134 - 144 mmol/L 141  143  141   Potassium 3.5 - 5.2 mmol/L 3.5  3.1  3.8   Chloride 96 - 106 mmol/L 104  106  110   CO2 20 - 29 mmol/L 21  30  26    Calcium 8.6 - 10.2 mg/dL 9.3  9.7  9.3   Total Protein 6.0 - 8.3 g/dL  7.2  7.2   Total Bilirubin 0.2 - 1.2 mg/dL  1.1  1.2   Alkaline Phos 39 - 117 U/L  97  82   AST 0 - 37 U/L  38  37   ALT 0 - 53 U/L  32  29     DIAGNOSTIC IMAGING:  I have independently reviewed the relevant imaging and discussed with the patient.   WRAP UP:  All questions were answered. The patient knows to call the clinic with any  problems, questions or concerns.  Medical decision making: ***  Time spent on visit: I spent *** minutes counseling the patient face to face. The total time spent in the appointment was *** minutes and more than 50% was on counseling.  Carnella Guadalajara, PA-C  ***

## 2023-01-04 ENCOUNTER — Other Ambulatory Visit: Payer: Self-pay | Admitting: Family Medicine

## 2023-01-04 ENCOUNTER — Other Ambulatory Visit: Payer: Self-pay

## 2023-01-04 ENCOUNTER — Ambulatory Visit: Payer: BC Managed Care – PPO | Admitting: Physician Assistant

## 2023-01-04 ENCOUNTER — Inpatient Hospital Stay (HOSPITAL_BASED_OUTPATIENT_CLINIC_OR_DEPARTMENT_OTHER): Payer: BC Managed Care – PPO | Admitting: Physician Assistant

## 2023-01-04 ENCOUNTER — Inpatient Hospital Stay: Payer: BC Managed Care – PPO | Attending: Physician Assistant

## 2023-01-04 ENCOUNTER — Other Ambulatory Visit: Payer: BC Managed Care – PPO

## 2023-01-04 VITALS — BP 107/45 | HR 65 | Temp 98.6°F | Resp 18 | Wt 211.8 lb

## 2023-01-04 DIAGNOSIS — Z882 Allergy status to sulfonamides status: Secondary | ICD-10-CM | POA: Insufficient documentation

## 2023-01-04 DIAGNOSIS — E876 Hypokalemia: Secondary | ICD-10-CM

## 2023-01-04 DIAGNOSIS — Z803 Family history of malignant neoplasm of breast: Secondary | ICD-10-CM | POA: Insufficient documentation

## 2023-01-04 DIAGNOSIS — D696 Thrombocytopenia, unspecified: Secondary | ICD-10-CM | POA: Diagnosis not present

## 2023-01-04 DIAGNOSIS — Z8719 Personal history of other diseases of the digestive system: Secondary | ICD-10-CM | POA: Insufficient documentation

## 2023-01-04 DIAGNOSIS — F17210 Nicotine dependence, cigarettes, uncomplicated: Secondary | ICD-10-CM | POA: Diagnosis not present

## 2023-01-04 DIAGNOSIS — M2550 Pain in unspecified joint: Secondary | ICD-10-CM | POA: Diagnosis not present

## 2023-01-04 DIAGNOSIS — Z86010 Personal history of colonic polyps: Secondary | ICD-10-CM | POA: Diagnosis not present

## 2023-01-04 DIAGNOSIS — Z833 Family history of diabetes mellitus: Secondary | ICD-10-CM | POA: Diagnosis not present

## 2023-01-04 DIAGNOSIS — E119 Type 2 diabetes mellitus without complications: Secondary | ICD-10-CM | POA: Insufficient documentation

## 2023-01-04 DIAGNOSIS — E1122 Type 2 diabetes mellitus with diabetic chronic kidney disease: Secondary | ICD-10-CM

## 2023-01-04 DIAGNOSIS — I480 Paroxysmal atrial fibrillation: Secondary | ICD-10-CM | POA: Insufficient documentation

## 2023-01-04 DIAGNOSIS — G4730 Sleep apnea, unspecified: Secondary | ICD-10-CM | POA: Diagnosis not present

## 2023-01-04 DIAGNOSIS — K5730 Diverticulosis of large intestine without perforation or abscess without bleeding: Secondary | ICD-10-CM | POA: Diagnosis not present

## 2023-01-04 DIAGNOSIS — K7460 Unspecified cirrhosis of liver: Secondary | ICD-10-CM | POA: Insufficient documentation

## 2023-01-04 DIAGNOSIS — R5383 Other fatigue: Secondary | ICD-10-CM | POA: Insufficient documentation

## 2023-01-04 DIAGNOSIS — Z8261 Family history of arthritis: Secondary | ICD-10-CM | POA: Insufficient documentation

## 2023-01-04 DIAGNOSIS — Z87442 Personal history of urinary calculi: Secondary | ICD-10-CM | POA: Diagnosis not present

## 2023-01-04 DIAGNOSIS — Z8249 Family history of ischemic heart disease and other diseases of the circulatory system: Secondary | ICD-10-CM | POA: Insufficient documentation

## 2023-01-04 DIAGNOSIS — D649 Anemia, unspecified: Secondary | ICD-10-CM

## 2023-01-04 DIAGNOSIS — Z818 Family history of other mental and behavioral disorders: Secondary | ICD-10-CM | POA: Insufficient documentation

## 2023-01-04 DIAGNOSIS — K3189 Other diseases of stomach and duodenum: Secondary | ICD-10-CM | POA: Insufficient documentation

## 2023-01-04 DIAGNOSIS — F17290 Nicotine dependence, other tobacco product, uncomplicated: Secondary | ICD-10-CM | POA: Insufficient documentation

## 2023-01-04 DIAGNOSIS — Z881 Allergy status to other antibiotic agents status: Secondary | ICD-10-CM | POA: Diagnosis not present

## 2023-01-04 DIAGNOSIS — E669 Obesity, unspecified: Secondary | ICD-10-CM | POA: Diagnosis not present

## 2023-01-04 DIAGNOSIS — K31819 Angiodysplasia of stomach and duodenum without bleeding: Secondary | ICD-10-CM | POA: Insufficient documentation

## 2023-01-04 DIAGNOSIS — Z79899 Other long term (current) drug therapy: Secondary | ICD-10-CM | POA: Diagnosis not present

## 2023-01-04 DIAGNOSIS — Z7901 Long term (current) use of anticoagulants: Secondary | ICD-10-CM | POA: Insufficient documentation

## 2023-01-04 DIAGNOSIS — Z825 Family history of asthma and other chronic lower respiratory diseases: Secondary | ICD-10-CM | POA: Insufficient documentation

## 2023-01-04 LAB — COMPREHENSIVE METABOLIC PANEL
ALT: 32 U/L (ref 0–44)
AST: 38 U/L (ref 15–41)
Albumin: 3.1 g/dL — ABNORMAL LOW (ref 3.5–5.0)
Alkaline Phosphatase: 75 U/L (ref 38–126)
Anion gap: 10 (ref 5–15)
BUN: 14 mg/dL (ref 8–23)
CO2: 26 mmol/L (ref 22–32)
Calcium: 8.8 mg/dL — ABNORMAL LOW (ref 8.9–10.3)
Chloride: 105 mmol/L (ref 98–111)
Creatinine, Ser: 1.28 mg/dL — ABNORMAL HIGH (ref 0.61–1.24)
GFR, Estimated: 59 mL/min — ABNORMAL LOW (ref >60)
Glucose, Bld: 127 mg/dL — ABNORMAL HIGH (ref 70–99)
Potassium: 2.8 mmol/L — ABNORMAL LOW (ref 3.5–5.1)
Sodium: 141 mmol/L (ref 135–145)
Total Bilirubin: 1 mg/dL (ref 0.3–1.2)
Total Protein: 6.7 g/dL (ref 6.5–8.1)

## 2023-01-04 LAB — CBC WITH DIFFERENTIAL/PLATELET
Abs Immature Granulocytes: 0.02 10*3/uL (ref 0.00–0.07)
Basophils Absolute: 0 10*3/uL (ref 0.0–0.1)
Basophils Relative: 1 %
Eosinophils Absolute: 0.1 10*3/uL (ref 0.0–0.5)
Eosinophils Relative: 2 %
HCT: 32.9 % — ABNORMAL LOW (ref 39.0–52.0)
Hemoglobin: 10.7 g/dL — ABNORMAL LOW (ref 13.0–17.0)
Immature Granulocytes: 0 %
Lymphocytes Relative: 25 %
Lymphs Abs: 1.5 10*3/uL (ref 0.7–4.0)
MCH: 32 pg (ref 26.0–34.0)
MCHC: 32.5 g/dL (ref 30.0–36.0)
MCV: 98.5 fL (ref 80.0–100.0)
Monocytes Absolute: 1.1 10*3/uL — ABNORMAL HIGH (ref 0.1–1.0)
Monocytes Relative: 18 %
Neutro Abs: 3.2 10*3/uL (ref 1.7–7.7)
Neutrophils Relative %: 54 %
Platelets: 125 10*3/uL — ABNORMAL LOW (ref 150–400)
RBC: 3.34 MIL/uL — ABNORMAL LOW (ref 4.22–5.81)
RDW: 15.5 % (ref 11.5–15.5)
WBC: 6 10*3/uL (ref 4.0–10.5)
nRBC: 0 % (ref 0.0–0.2)

## 2023-01-04 LAB — HEMOGLOBIN A1C
Hgb A1c MFr Bld: 5.1 % (ref 4.8–5.6)
Mean Plasma Glucose: 99.67 mg/dL

## 2023-01-04 MED ORDER — POTASSIUM CHLORIDE CRYS ER 20 MEQ PO TBCR
20.0000 meq | EXTENDED_RELEASE_TABLET | Freq: Two times a day (BID) | ORAL | 0 refills | Status: DC
Start: 2023-01-04 — End: 2023-01-10

## 2023-01-05 ENCOUNTER — Encounter: Payer: Self-pay | Admitting: Physician Assistant

## 2023-01-05 ENCOUNTER — Encounter: Payer: Self-pay | Admitting: Family Medicine

## 2023-01-05 ENCOUNTER — Other Ambulatory Visit: Payer: Self-pay

## 2023-01-05 ENCOUNTER — Other Ambulatory Visit: Payer: Self-pay | Admitting: Physician Assistant

## 2023-01-05 DIAGNOSIS — D649 Anemia, unspecified: Secondary | ICD-10-CM

## 2023-01-05 DIAGNOSIS — D696 Thrombocytopenia, unspecified: Secondary | ICD-10-CM

## 2023-01-09 ENCOUNTER — Other Ambulatory Visit: Payer: Self-pay | Admitting: *Deleted

## 2023-01-09 ENCOUNTER — Other Ambulatory Visit: Payer: Medicare Other

## 2023-01-09 DIAGNOSIS — D649 Anemia, unspecified: Secondary | ICD-10-CM

## 2023-01-09 DIAGNOSIS — E1159 Type 2 diabetes mellitus with other circulatory complications: Secondary | ICD-10-CM

## 2023-01-09 DIAGNOSIS — D696 Thrombocytopenia, unspecified: Secondary | ICD-10-CM

## 2023-01-09 DIAGNOSIS — E1122 Type 2 diabetes mellitus with diabetic chronic kidney disease: Secondary | ICD-10-CM | POA: Diagnosis not present

## 2023-01-09 DIAGNOSIS — N182 Chronic kidney disease, stage 2 (mild): Secondary | ICD-10-CM | POA: Diagnosis not present

## 2023-01-09 DIAGNOSIS — Z794 Long term (current) use of insulin: Secondary | ICD-10-CM | POA: Diagnosis not present

## 2023-01-09 DIAGNOSIS — E876 Hypokalemia: Secondary | ICD-10-CM

## 2023-01-09 DIAGNOSIS — R6889 Other general symptoms and signs: Secondary | ICD-10-CM | POA: Diagnosis not present

## 2023-01-09 LAB — BASIC METABOLIC PANEL
BUN/Creatinine Ratio: 13 (ref 10–24)
BUN: 15 mg/dL (ref 8–27)
CO2: 24 mmol/L (ref 20–29)
Calcium: 9.4 mg/dL (ref 8.6–10.2)
Chloride: 107 mmol/L — ABNORMAL HIGH (ref 96–106)
Creatinine, Ser: 1.17 mg/dL (ref 0.76–1.27)
Glucose: 125 mg/dL — ABNORMAL HIGH (ref 70–99)
Potassium: 3.1 mmol/L — ABNORMAL LOW (ref 3.5–5.2)
Sodium: 144 mmol/L (ref 134–144)
eGFR: 66 mL/min/{1.73_m2} (ref >59)

## 2023-01-09 LAB — BAYER DCA HB A1C WAIVED: HB A1C (BAYER DCA - WAIVED): 4.9 % (ref 4.8–5.6)

## 2023-01-09 LAB — IMMUNOFIXATION ELECTROPHORESIS

## 2023-01-09 LAB — COMPREHENSIVE METABOLIC PANEL: Albumin/Globulin Ratio: 1.1 — ABNORMAL LOW (ref 1.2–2.2)

## 2023-01-09 LAB — CBC WITH DIFFERENTIAL/PLATELET
EOS (ABSOLUTE): 0.1 10*3/uL (ref 0.0–0.4)
Lymphs: 23 %
MCV: 98 fL — ABNORMAL HIGH (ref 79–97)
Monocytes Absolute: 0.7 10*3/uL (ref 0.1–0.9)
Neutrophils: 60 %

## 2023-01-09 LAB — PROTEIN ELECTROPHORESIS, SERUM

## 2023-01-10 ENCOUNTER — Ambulatory Visit (HOSPITAL_BASED_OUTPATIENT_CLINIC_OR_DEPARTMENT_OTHER): Payer: BC Managed Care – PPO | Admitting: Pulmonary Disease

## 2023-01-10 ENCOUNTER — Ambulatory Visit (INDEPENDENT_AMBULATORY_CARE_PROVIDER_SITE_OTHER): Payer: BC Managed Care – PPO | Admitting: Pulmonary Disease

## 2023-01-10 ENCOUNTER — Other Ambulatory Visit: Payer: Self-pay | Admitting: Family Medicine

## 2023-01-10 ENCOUNTER — Encounter (HOSPITAL_BASED_OUTPATIENT_CLINIC_OR_DEPARTMENT_OTHER): Payer: Self-pay | Admitting: Pulmonary Disease

## 2023-01-10 VITALS — BP 126/66 | HR 74 | Temp 98.2°F | Ht 68.0 in | Wt 214.6 lb

## 2023-01-10 DIAGNOSIS — G4733 Obstructive sleep apnea (adult) (pediatric): Secondary | ICD-10-CM

## 2023-01-10 DIAGNOSIS — E876 Hypokalemia: Secondary | ICD-10-CM

## 2023-01-10 LAB — VITAMIN B12: Vitamin B-12: 931 pg/mL (ref 232–1245)

## 2023-01-10 LAB — LACTATE DEHYDROGENASE: LDH: 259 IU/L — ABNORMAL HIGH (ref 121–224)

## 2023-01-10 LAB — IRON AND TIBC
Iron Saturation: 14 % — ABNORMAL LOW (ref 15–55)
Iron: 46 ug/dL (ref 38–169)
Total Iron Binding Capacity: 332 ug/dL (ref 250–450)
UIBC: 286 ug/dL (ref 111–343)

## 2023-01-10 LAB — PROTEIN ELECTROPHORESIS, SERUM

## 2023-01-10 LAB — CBC WITH DIFFERENTIAL/PLATELET
Basophils Absolute: 0 10*3/uL (ref 0.0–0.2)
MCH: 30.4 pg (ref 26.6–33.0)
MCHC: 31.2 g/dL — ABNORMAL LOW (ref 31.5–35.7)
WBC: 5.2 10*3/uL (ref 3.4–10.8)

## 2023-01-10 LAB — COPPER, SERUM

## 2023-01-10 LAB — KAPPA/LAMBDA LIGHT CHAINS: Ig Lambda Free Light Chain: 36 mg/L — ABNORMAL HIGH (ref 5.7–26.3)

## 2023-01-10 MED ORDER — POTASSIUM CHLORIDE CRYS ER 20 MEQ PO TBCR: 20.0000 meq | EXTENDED_RELEASE_TABLET | Freq: Every day | ORAL | 0 refills | Status: DC

## 2023-01-10 NOTE — Progress Notes (Signed)
[facility]  Chief Complaint  Patient presents with   Follow-up    Follow up on inspire.     Past Surgical History:  He  has a past surgical history that includes Lumbar disc surgery (03/1995); Inguinal hernia repair (2003); Shoulder surgery (11/28/2005); Shoulder surgery (07/14/2006); Carpal tunnel release (Right, 10/13/2015); Knee arthroscopy (Right, 03/03/2016); Carpal tunnel release (Left, 07/05/2016); Lumbar laminectomy/decompression microdiscectomy (Right, 10/06/2016); Back surgery; Tonsillectomy (AGE 85 OR 4); Total shoulder arthroplasty (Right, 11/01/2018); Drug induced endoscopy (N/A, 07/02/2021); Implantation of hypoglossal nerve stimulator (Right, 10/01/2021); Total knee arthroplasty (Right, 03/07/2022); Total knee arthroplasty (Left, 06/06/2022); and Colonoscopy with esophagogastroduodenoscopy (egd) (10/05/2022).  Past Medical History:  TMJ, Aortic stenosis, OA, BPH, Cirrhosis, Colon polyps, Diabetes, A fib, PUD, GERD, Nephrolithiasis, Hypertension, C diff colitis  Constitutional:  BP 126/66 (BP Location: Left Arm, Patient Position: Sitting, Cuff Size: Normal)   Pulse 74   Temp 98.2 F (36.8 C) (Oral)   Ht 5\' 8"  (1.727 m)   Wt 214 lb 9.6 oz (97.3 kg)   SpO2 96%   BMI 32.63 kg/m   Brief Summary:  Tony Cantu is a 72 y.o. male with obstructive sleep apnea.  He had Inspire device implanted 10/01/21.      Subjective:   He was recently found to have anemia, thrombocytopenia and hypokalemia.  He as been feeling weak.  He has follow up with hematology.  No issues with Inspire device.  Denies tongue/neck pain.  No difficult with speech or swallowing.  Not snoring.  Average use 8 hrs 30 min with use on 97% of nights.    Physical Exam:   Appearance - well kempt   ENMT - no sinus tenderness, no oral exudate, no LAN, Mallampati 3 airway, no stridor  Respiratory - equal breath sounds bilaterally, no wheezing or rales  CV -  s1s2 regular rate and rhythm, 2/6 SM  Ext - no clubbing, no edema  Skin - no rashes  Psych - normal mood and affect     Pulmonary testing:  PFT 02/09/16 >> FEV1 2.38 (70%), FEV1% 84, TLC 4.96 (70%), DLCO 78%  Sleep Tests:  PSG 05/02/12 >> AHI 76 Home sleep study 04/07/21 >> AHI 49.3, SpO2 low 79%  Cardiac Tests:  Echo 09/22/21 >> EF 55 to 60%, grade 1 DD, mild LA dilation, mild MR, mod AS  Social History:  He  reports that he quit smoking about 19 years ago. His smoking use included cigarettes and pipe. He has never used smokeless tobacco. He reports that he does not currently use alcohol. He reports that he does not use drugs.  Family History:  His family history includes Allergic rhinitis in his sister; Alzheimer's disease in his mother; Asthma in his father; Breast cancer in his maternal grandmother; Diabetes in his mother; Heart attack in his maternal grandmother and mother; Heart failure in his maternal grandmother and mother; Rheum arthritis in his maternal grandmother; Suicidality in his father.     Assessment/Plan:   Obstructive sleep apnea. - doing well since Inspire implanted 10/01/21 - current setting at 1.1 V - will need follow up home sleep study after September 2024   Paroxysmal atrial fibrillation, Aortic stenosis. - followed by Dr. Rollene Rotunda with Cardiology  Anemia, thrombocytopenia. - followed by hematology in [place]  Time Spent Involved in Patient Care on Day of Examination:  27 minutes  Follow up:   Patient Instructions  Will arrange for a home sleep study after September 2024  and follow up after this  Medication List:   Allergies as of 01/10/2023       Reactions   Dimetapp Children's Cold-cough    Chest discomfort    Erythromycin Diarrhea   Sulfonamide Derivatives Diarrhea        Medication List        Accurate as of Jan 10, 2023  9:57 AM. If you have any questions, ask your nurse or doctor.          acetaminophen 500 MG  tablet Commonly known as: TYLENOL Take 1,000 mg by mouth every 6 (six) hours as needed for moderate pain.   apixaban 5 MG Tabs tablet Commonly known as: ELIQUIS Take 1 tablet (5 mg total) by mouth 2 (two) times daily.   atorvastatin 40 MG tablet Commonly known as: LIPITOR TAKE 1 TABLET BY MOUTH DAILY What changed: when to take this   b complex vitamins tablet Take 1 tablet by mouth daily.   benzonatate 100 MG capsule Commonly known as: Tessalon Perles Take 1 capsule (100 mg total) by mouth 3 (three) times daily as needed.   cefdinir 300 MG capsule Commonly known as: OMNICEF Take 1 capsule (300 mg total) by mouth 2 (two) times daily. 1 po BID   CHROMIUM PICOLATE PO Take 1 tablet by mouth daily.   CINNAMON PO Take 1,080 mg by mouth daily.   Contour Next One Kit Test BS QID and as needed Dx E11.9   Contour Next Test test strip Generic drug: glucose blood Test BS QID and as needed Dx E11.9   CoQ10 100 MG Caps Take 100 mg by mouth daily.   Dialyvite Vitamin D 5000 125 MCG (5000 UT) capsule Generic drug: Cholecalciferol Take 5,000 Units by mouth daily.   diltiazem 30 MG tablet Commonly known as: CARDIZEM TAKE 1 TABLET BY MOUTH  TWICE DAILY   Eye Health Caps Take 1 capsule by mouth daily.   FreeStyle [employer] System Misc Check BS eight (8) times a day. Dx E10.9   furosemide 20 MG tablet Commonly known as: LASIX TAKE 1 AND 1/2 TABLETS BY MOUTH  DAILY What changed: how much to take   Glucagon Emergency 1 MG Kit INJECT AS DIRECTED INTO  UPPER ARM, THIGH OR  BUTTOCKS AS NEEDED FOR  SEVERE HYPOGLYCEMIA. SEEK  MEDICAL ATTENTION AFTER USE What changed: See the new instructions.   Glucosamine 1500 Complex Caps Take 2 capsules by mouth daily.   insulin lispro 100 UNIT/ML injection Commonly known as: HumaLOG INJECT SUBCUTANEOUSLY 20 TO 50  UNITS 3 TIMES DAILY BEFORE MEALS PER SLIDING SCALE What changed:  how much to take how to take this when to take  this additional instructions   Magnesium 250 MG Tabs Take 250 mg by mouth daily.   METAMUCIL FIBER PO Take 1 capsule by mouth 2 (two) times daily.   Microlet Lancets Misc Test BS QID and as needed Dx E11.9   montelukast 10 MG tablet Commonly known as: SINGULAIR TAKE 1 TABLET BY MOUTH AT  BEDTIME   oxyCODONE 5 MG immediate release tablet Commonly known as: Oxy IR/ROXICODONE Take 5 mg by mouth 2 (two) times daily as needed.   pantoprazole 40 MG tablet Commonly known as: PROTONIX TAKE 1 TABLET BY MOUTH TWICE  DAILY BEFORE MEALS   potassium chloride SA 20 MEQ tablet Commonly known as: KLOR-CON M Take 1 tablet (20 mEq total) by mouth daily. What changed: when to take this Changed by: Delynn Flavin, DO   ramelteon  8 MG tablet Commonly known as: Rozerem Take 1 tablet (8 mg total) by mouth at bedtime.   tiZANidine 2 MG tablet Commonly known as: ZANAFLEX Take 2 mg by mouth every 8 (eight) hours as needed for muscle spasms.   Toujeo Max SoloStar 300 UNIT/ML Solostar Pen Generic drug: insulin glargine (2 Unit Dial) INJECT SUBCUTANEOUSLY 110 UNITS  DAILY What changed: See the new instructions.   traZODone 50 MG tablet Commonly known as: DESYREL Take 0.5-1 tablets (25-50 mg total) by mouth at bedtime as needed for sleep.   Turmeric Curcumin 500 MG Caps Take 500 mg by mouth daily.   valsartan 40 MG tablet Commonly known as: DIOVAN Take 1 tablet (40 mg total) by mouth daily.        Signature:  Coralyn Helling, MD [facility]/Critical Care Pager - [phone] 01/10/2023, 9:57 AM

## 2023-01-10 NOTE — Patient Instructions (Addendum)
Will arrange for a home sleep study after September 2024 and follow up after this

## 2023-01-11 ENCOUNTER — Inpatient Hospital Stay: Payer: BC Managed Care – PPO

## 2023-01-11 LAB — CBC WITH DIFFERENTIAL/PLATELET
Basos: 1 %
Eos: 3 %
Hematocrit: 35.9 % — ABNORMAL LOW (ref 37.5–51.0)
Neutrophils Absolute: 3.2 10*3/uL (ref 1.4–7.0)
Platelets: 131 10*3/uL — ABNORMAL LOW (ref 150–450)
RBC: 3.68 x10E6/uL — ABNORMAL LOW (ref 4.14–5.80)

## 2023-01-11 LAB — PROTEIN ELECTROPHORESIS, SERUM: Alpha 2: 0.6 g/dL (ref 0.4–1.0)

## 2023-01-11 LAB — COMPREHENSIVE METABOLIC PANEL
Albumin: 3.6 g/dL — ABNORMAL LOW (ref 3.8–4.8)
Glucose: 125 mg/dL — ABNORMAL HIGH (ref 70–99)

## 2023-01-11 LAB — FERRITIN: Ferritin: 37 ng/mL (ref 30–400)

## 2023-01-11 LAB — METHYLMALONIC ACID, SERUM

## 2023-01-11 LAB — FOLATE: Folate: >20 ng/mL (ref >3.0)

## 2023-01-12 LAB — COMPREHENSIVE METABOLIC PANEL
ALT: 30 IU/L (ref 0–44)
CO2: 22 mmol/L (ref 20–29)
Calcium: 9.6 mg/dL (ref 8.6–10.2)
Globulin, Total: 3.3 g/dL (ref 1.5–4.5)

## 2023-01-12 LAB — PROTEIN ELECTROPHORESIS, SERUM: A/G Ratio: 0.9 (ref 0.7–1.7)

## 2023-01-12 LAB — RETICULOCYTES: Retic Ct Pct: 2.2 % (ref 0.6–2.6)

## 2023-01-12 LAB — IMMUNOFIXATION ELECTROPHORESIS: IgG (Immunoglobin G), Serum: 1450 mg/dL (ref 603–1613)

## 2023-01-12 LAB — CBC WITH DIFFERENTIAL/PLATELET
Immature Granulocytes: 0 %
Lymphocytes Absolute: 1.2 10*3/uL (ref 0.7–3.1)

## 2023-01-17 DIAGNOSIS — T148XXA Other injury of unspecified body region, initial encounter: Secondary | ICD-10-CM | POA: Diagnosis not present

## 2023-01-17 NOTE — Progress Notes (Unsigned)
VIRTUAL VISIT via TELEPHONE NOTE [facility]   I connected with Tony Cantu  on 01/18/23 at  8:20 AM by telephone and verified that I am speaking with the correct person using two identifiers.  Location: Patient: Home Provider: [facility]   I discussed the limitations, risks, security and privacy concerns of performing an evaluation and management service by telephone and the availability of in person appointments. I also discussed with the patient that there may be a patient responsible charge related to this service. The patient expressed understanding and agreed to proceed.   INTERVAL HISTORY:  Tony Cantu is contacted today for follow-up of normocytic anemia.  He primarily follows with our clinic due to pain in the setting of cirrhosis, but at last office visit on 01/04/2023 with [facility], he was noted to have new onset normocytic anemia with Hgb 10.7.  He is contacted today to discuss results of laboratory workup of anemia, although he primarily wants to discuss his marital difficulties during this visit.  Patient denies any rectal bleeding or melena, but would certainly be at risk for occult blood loss based on the above findings.  He reports ongoing fatigue.  He denies any pica, lightheadedness, syncope, headaches, chest pain, or dyspnea on exertion.  He denies any unintentional weight loss or B symptoms.  He reports little to no energy and 100% appetite.    REVIEW OF SYSTEMS:   Review of Systems  Constitutional:  Positive for malaise/fatigue. Negative for chills, diaphoresis, fever and weight loss.  Respiratory:  Negative for cough and shortness of breath.   Cardiovascular:  Positive for leg swelling. Negative for chest pain and palpitations.  Gastrointestinal:  Negative for abdominal pain, blood in stool, melena, nausea and vomiting.  Neurological:  Positive for dizziness. Negative for headaches.  Psychiatric/Behavioral:  Positive  for depression. The patient is nervous/anxious and has insomnia.      PHYSICAL EXAM: (per limitations of virtual telephone visit)  The patient is alert and oriented x 3, exhibiting adequate mentation, good mood, and ability to speak in full sentences and execute sound judgement.  ASSESSMENT & PLAN:  1.  Normocytic anemia - Mild normocytic anemia noted with CBC/D from 01/04/2023 revealing Hgb 10.7/MCV 98.5.   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  Colonoscopy revealed multiple polyps, diverticulosis, internal hemorrhoids. - Hematology workup (01/09/2023): Ferritin 37, iron saturation 14% Creatinine 1.14/GFR 68 Normal folate, B12, MMA, copper Immunofixation PENDING, elevated kappa free light chain 114.5, elevated lambda free light chain 36.0, elevated FLC ratio 3.18.  SPEP negative for M spike. LDH elevated 259.  Bilirubin normal 0.8.  Reticulocytes 2.2%. - Most recent CBC/differential (01/09/2023): Hgb 11.2/MCV 98.0.  Platelets 131 (stable at baseline in the setting of cirrhosis). - Patient denies any rectal bleeding or melena, but would certainly be at risk for occult blood loss based on the above findings. - He reports fatigue. - PLAN: Laboratory findings suggest iron deficiency anemia, and patient is at risk for occult blood loss based on GAVE noted on endoscopy from February 2024.   - Recommend starting ferrous sulfate 325 mg daily with stool softener.   - We will recheck Cystatin C ([facility] send out), CMP, CBC, ferritin, and iron/TIBC in 3 months. - OFFICE visit in 3 months (1 week after labs) - Will check stool cards x 3 at next office visit. -  Will plan on rechecking light chains/MGUS panel in 6 months (November 2024).  2.  OTHER ISSUES (not addressed during today's visit) - Thrombocytopenia, mild, secondary to cirrhosis - **See note  from office visit on 01/04/2023 for full assessment and plan of his various hematologic and other concerns  PLAN SUMMARY: >> Labs in 3 months = Cystatin C ([facility] send out), CMP, CBC/D, ferritin, iron/TIBC >> OFFICE visit in 3 months (1 week after labs)     I discussed the assessment and treatment plan with the patient. The patient was provided an opportunity to ask questions and all were answered. The patient agreed with the plan and demonstrated an understanding of the instructions.   The patient was advised to call back or seek an in-person evaluation if the symptoms worsen or if the condition fails to improve as anticipated.  I provided 22 minutes of non-face-to-face time during this encounter.  Carnella Guadalajara, PA-C 01/18/23 8:45 AM

## 2023-01-18 ENCOUNTER — Encounter: Payer: Self-pay | Admitting: Physician Assistant

## 2023-01-18 ENCOUNTER — Other Ambulatory Visit: Payer: Self-pay

## 2023-01-18 ENCOUNTER — Inpatient Hospital Stay (HOSPITAL_BASED_OUTPATIENT_CLINIC_OR_DEPARTMENT_OTHER): Payer: BC Managed Care – PPO | Admitting: Physician Assistant

## 2023-01-18 VITALS — Wt 114.0 lb

## 2023-01-18 DIAGNOSIS — D696 Thrombocytopenia, unspecified: Secondary | ICD-10-CM

## 2023-01-18 DIAGNOSIS — D649 Anemia, unspecified: Secondary | ICD-10-CM | POA: Diagnosis not present

## 2023-01-18 LAB — COMPREHENSIVE METABOLIC PANEL
AST: 37 IU/L (ref 0–40)
BUN: 15 mg/dL (ref 8–27)
Bilirubin Total: 0.8 mg/dL (ref 0.0–1.2)
Sodium: 144 mmol/L (ref 134–144)

## 2023-01-18 LAB — IMMUNOFIXATION ELECTROPHORESIS: Total Protein: 6.9 g/dL (ref 6.0–8.5)

## 2023-01-18 LAB — CBC WITH DIFFERENTIAL/PLATELET
Hemoglobin: 11.2 g/dL — ABNORMAL LOW (ref 13.0–17.7)
Immature Grans (Abs): 0 10*3/uL (ref 0.0–0.1)
Monocytes: 13 %
RDW: 14.3 % (ref 11.6–15.4)

## 2023-01-18 LAB — KAPPA/LAMBDA LIGHT CHAINS
Ig Kappa Free Light Chain: 114.5 mg/L — ABNORMAL HIGH (ref 3.3–19.4)
KAPPA/LAMBDA RATIO: 3.18 — ABNORMAL HIGH (ref 0.26–1.65)

## 2023-01-18 LAB — PROTEIN ELECTROPHORESIS, SERUM
Alpha 1: 0.2 g/dL (ref 0.0–0.4)
Beta: 1.1 g/dL (ref 0.7–1.3)
Globulin, Total: 3.7 g/dL (ref 2.2–3.9)

## 2023-01-18 MED ORDER — FERROUS SULFATE 325 (65 FE) MG PO TBEC
325.0000 mg | DELAYED_RELEASE_TABLET | Freq: Every day | ORAL | 3 refills | Status: AC
Start: 2023-01-18 — End: ?

## 2023-01-31 ENCOUNTER — Other Ambulatory Visit: Payer: Self-pay | Admitting: Cardiology

## 2023-02-02 DIAGNOSIS — E1122 Type 2 diabetes mellitus with diabetic chronic kidney disease: Secondary | ICD-10-CM | POA: Diagnosis not present

## 2023-02-02 DIAGNOSIS — N182 Chronic kidney disease, stage 2 (mild): Secondary | ICD-10-CM | POA: Diagnosis not present

## 2023-02-02 DIAGNOSIS — Z794 Long term (current) use of insulin: Secondary | ICD-10-CM | POA: Diagnosis not present

## 2023-02-16 ENCOUNTER — Other Ambulatory Visit: Payer: Self-pay | Admitting: Family Medicine

## 2023-02-16 DIAGNOSIS — R600 Localized edema: Secondary | ICD-10-CM

## 2023-03-04 DIAGNOSIS — E1122 Type 2 diabetes mellitus with diabetic chronic kidney disease: Secondary | ICD-10-CM | POA: Diagnosis not present

## 2023-03-04 DIAGNOSIS — N182 Chronic kidney disease, stage 2 (mild): Secondary | ICD-10-CM | POA: Diagnosis not present

## 2023-03-04 DIAGNOSIS — Z794 Long term (current) use of insulin: Secondary | ICD-10-CM | POA: Diagnosis not present

## 2023-03-09 ENCOUNTER — Encounter: Payer: Self-pay | Admitting: Family Medicine

## 2023-03-10 ENCOUNTER — Other Ambulatory Visit: Payer: Self-pay | Admitting: Family Medicine

## 2023-03-10 DIAGNOSIS — E1122 Type 2 diabetes mellitus with diabetic chronic kidney disease: Secondary | ICD-10-CM

## 2023-03-23 DIAGNOSIS — E1122 Type 2 diabetes mellitus with diabetic chronic kidney disease: Secondary | ICD-10-CM | POA: Diagnosis not present

## 2023-03-23 DIAGNOSIS — N182 Chronic kidney disease, stage 2 (mild): Secondary | ICD-10-CM | POA: Diagnosis not present

## 2023-03-23 DIAGNOSIS — Z794 Long term (current) use of insulin: Secondary | ICD-10-CM | POA: Diagnosis not present

## 2023-03-27 ENCOUNTER — Encounter: Payer: Self-pay | Admitting: Family Medicine

## 2023-03-27 ENCOUNTER — Ambulatory Visit: Payer: Medicare Other | Admitting: Family Medicine

## 2023-03-28 NOTE — Telephone Encounter (Signed)
Please help pt reschedule OV.

## 2023-04-02 ENCOUNTER — Other Ambulatory Visit: Payer: Self-pay | Admitting: Family Medicine

## 2023-04-02 DIAGNOSIS — E876 Hypokalemia: Secondary | ICD-10-CM

## 2023-04-04 DIAGNOSIS — Z794 Long term (current) use of insulin: Secondary | ICD-10-CM | POA: Diagnosis not present

## 2023-04-04 DIAGNOSIS — N182 Chronic kidney disease, stage 2 (mild): Secondary | ICD-10-CM | POA: Diagnosis not present

## 2023-04-04 DIAGNOSIS — E1122 Type 2 diabetes mellitus with diabetic chronic kidney disease: Secondary | ICD-10-CM | POA: Diagnosis not present

## 2023-04-19 ENCOUNTER — Inpatient Hospital Stay: Payer: BC Managed Care – PPO | Attending: Physician Assistant

## 2023-04-19 ENCOUNTER — Inpatient Hospital Stay: Payer: BC Managed Care – PPO | Admitting: Physician Assistant

## 2023-04-19 DIAGNOSIS — D696 Thrombocytopenia, unspecified: Secondary | ICD-10-CM | POA: Diagnosis not present

## 2023-04-19 DIAGNOSIS — D649 Anemia, unspecified: Secondary | ICD-10-CM

## 2023-04-19 DIAGNOSIS — Z79899 Other long term (current) drug therapy: Secondary | ICD-10-CM | POA: Diagnosis not present

## 2023-04-19 DIAGNOSIS — K3189 Other diseases of stomach and duodenum: Secondary | ICD-10-CM | POA: Diagnosis not present

## 2023-04-19 LAB — CBC WITH DIFFERENTIAL/PLATELET
Abs Immature Granulocytes: 0.02 10*3/uL (ref 0.00–0.07)
Basophils Absolute: 0.1 10*3/uL (ref 0.0–0.1)
Basophils Relative: 1 %
Eosinophils Absolute: 0.2 10*3/uL (ref 0.0–0.5)
Eosinophils Relative: 3 %
HCT: 43.8 % (ref 39.0–52.0)
Hemoglobin: 14.4 g/dL (ref 13.0–17.0)
Immature Granulocytes: 0 %
Lymphocytes Relative: 24 %
Lymphs Abs: 1.4 10*3/uL (ref 0.7–4.0)
MCH: 34.5 pg — ABNORMAL HIGH (ref 26.0–34.0)
MCHC: 32.9 g/dL (ref 30.0–36.0)
MCV: 105 fL — ABNORMAL HIGH (ref 80.0–100.0)
Monocytes Absolute: 1 10*3/uL (ref 0.1–1.0)
Monocytes Relative: 17 %
Neutro Abs: 3.1 10*3/uL (ref 1.7–7.7)
Neutrophils Relative %: 55 %
Platelets: 114 10*3/uL — ABNORMAL LOW (ref 150–400)
RBC: 4.17 MIL/uL — ABNORMAL LOW (ref 4.22–5.81)
RDW: 16.6 % — ABNORMAL HIGH (ref 11.5–15.5)
WBC: 5.7 10*3/uL (ref 4.0–10.5)
nRBC: 0 % (ref 0.0–0.2)

## 2023-04-19 LAB — COMPREHENSIVE METABOLIC PANEL
ALT: 42 U/L (ref 0–44)
AST: 47 U/L — ABNORMAL HIGH (ref 15–41)
Albumin: 3.4 g/dL — ABNORMAL LOW (ref 3.5–5.0)
Alkaline Phosphatase: 82 U/L (ref 38–126)
Anion gap: 8 (ref 5–15)
BUN: 12 mg/dL (ref 8–23)
CO2: 26 mmol/L (ref 22–32)
Calcium: 9.2 mg/dL (ref 8.9–10.3)
Chloride: 105 mmol/L (ref 98–111)
Creatinine, Ser: 1.17 mg/dL (ref 0.61–1.24)
GFR, Estimated: >60 mL/min (ref >60)
Glucose, Bld: 190 mg/dL — ABNORMAL HIGH (ref 70–99)
Potassium: 3.7 mmol/L (ref 3.5–5.1)
Sodium: 139 mmol/L (ref 135–145)
Total Bilirubin: 1.1 mg/dL (ref 0.3–1.2)
Total Protein: 6.9 g/dL (ref 6.5–8.1)

## 2023-04-19 LAB — FERRITIN: Ferritin: 35 ng/mL (ref 24–336)

## 2023-04-19 LAB — IRON AND TIBC
Iron: 149 ug/dL (ref 45–182)
Saturation Ratios: 44 % — ABNORMAL HIGH (ref 17.9–39.5)
TIBC: 336 ug/dL (ref 250–450)
UIBC: 187 ug/dL

## 2023-04-20 LAB — MISC LABCORP TEST (SEND OUT): Labcorp test code: 121251

## 2023-04-25 ENCOUNTER — Other Ambulatory Visit: Payer: Self-pay | Admitting: Family Medicine

## 2023-04-25 DIAGNOSIS — R600 Localized edema: Secondary | ICD-10-CM

## 2023-04-26 ENCOUNTER — Other Ambulatory Visit: Payer: Medicare Other

## 2023-04-26 DIAGNOSIS — E1122 Type 2 diabetes mellitus with diabetic chronic kidney disease: Secondary | ICD-10-CM | POA: Diagnosis not present

## 2023-04-26 DIAGNOSIS — Z794 Long term (current) use of insulin: Secondary | ICD-10-CM | POA: Diagnosis not present

## 2023-04-26 DIAGNOSIS — N182 Chronic kidney disease, stage 2 (mild): Secondary | ICD-10-CM

## 2023-04-26 LAB — BAYER DCA HB A1C WAIVED: HB A1C (BAYER DCA - WAIVED): 4.2 % — ABNORMAL LOW (ref 4.8–5.6)

## 2023-05-04 DIAGNOSIS — E1122 Type 2 diabetes mellitus with diabetic chronic kidney disease: Secondary | ICD-10-CM | POA: Diagnosis not present

## 2023-05-04 DIAGNOSIS — N182 Chronic kidney disease, stage 2 (mild): Secondary | ICD-10-CM | POA: Diagnosis not present

## 2023-05-04 DIAGNOSIS — Z794 Long term (current) use of insulin: Secondary | ICD-10-CM | POA: Diagnosis not present

## 2023-05-14 NOTE — Progress Notes (Deleted)
[facility] [address], [place] [address]   CLINIC:  Medical Oncology/Hematology  PCP:  [name], DO [address] [place] [address] [phone]   REASON FOR VISIT:  Follow-up for thrombocytopenia and iron deficiency anemia  CURRENT THERAPY: Surveillance  INTERVAL HISTORY:   Tony Cantu 72 y.o. male returns for routine follow-up of thrombocytopenia and iron deficiency anemia.  At today's visit, he reports feeling ***.  No recent hospitalizations, surgeries, or changes in baseline health status. *** He bruises easily. *** Patient denies any rectal bleeding or melena. *** He reports ongoing fatigue.  *** Improved after starting iron tablet?  *** *** He denies any pica, lightheadedness, syncope, headaches, chest pain, or dyspnea on exertion. *** He denies any unintentional weight loss or B symptoms. *** Iron tablet daily since May 2024  He has ***% energy and ***% appetite. He endorses that he is maintaining a stable weight.   ASSESSMENT & PLAN:  1.  Mild thrombocytopenia, likely secondary to cirrhosis -  Seen at the request of Dr. Nadine Counts for mild thrombocytopenia since September 2020. - No history of bleeding.  Mild bruising.  Denies any B symptoms.*** - Patient started on Eliquis for paroxysmal atrial fibrillation since mid January 2023. - No prior history of blood transfusions. - No history of RA, lupus, or ulcerative colitis/Crohn's disease. - CT abdomen on 04/28/2021 with cirrhosis.  Spleen size normal. - His previous lab reports indicate possible clumping of platelets contributing to mild thrombocytopenia. - Hematology work-up (12/10/2021): Pathology smear review confirms mild thrombocytopenia. Negative hepatitis B and C.  Negative H. pylori. Normal rheumatoid factor, ANA, LDH. No nutritional deficiencies.  Normal B12, folate, methylmalonic acid, copper. SPEP and reticulocytes within normal limits.  - Most recent labs  (04/19/2023): Platelets 114 (at baseline) - We discussed differential diagnosis for thrombocytopenia.  Etiology favors liver disease, but could also be immune mediated. - PLAN: No indication for treatment at this time. - We will continue to monitor platelets with labs every 6 to 12 months. - If any significant changes in baseline blood counts, would consider bone marrow biopsy. - If platelets less than 30, would consider treatment, including trial of steroids to see if there is any improvement.   2.  Macrocytic anemia - Mild anemia noted with CBC/D from 01/04/2023 revealing Hgb 10.7.  Macrocytosis likely from liver cirrhosis. 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  Colonoscopy revealed multiple polyps, diverticulosis, internal hemorrhoids. - Hematology workup (01/09/2023): Ferritin 37, iron saturation 14% Creatinine 1.14/GFR 68 Normal folate, B12, MMA, copper Immunofixation with polyclonal increase in immunoglobulins, elevated kappa free light chain 114.5, elevated lambda free light chain 36.0, elevated FLC ratio 3.18.  SPEP negative for M spike. LDH elevated 259.  Bilirubin normal 0.8.  Reticulocytes 2.2%. - Most recent labs (04/19/2023): Hgb 14.4/MCV 105.0.  Ferritin 35, iron saturation 44%. GFR >60 as evidenced by creatinine 1.17 with normal Cystatin C 1.09  CBC/differential (01/09/2023): Hgb 11.2/MCV 98.0.  Platelets 131 (stable at baseline in the setting of cirrhosis). - Patient denies any rectal bleeding or melena *** - He reports fatigue.*** - PLAN: Although patient has persistent iron deficiency, his hemoglobin has significantly improved.  Recommend to continue ferrous sulfate 325 mg daily with stool softener.   - Labs (CBC/D, ferritin, iron/TIBC, CMP, LDH, myeloma panel) + OFFICE visit in 3 months (1 week after labs) - Will plan  on  rechecking light chains/MGUS panel in 3 months, along with 24-hour urine***   3. Other concerns - Home safety topics were addressed at today's visit (01/04/2023) - Patient reports he has also discussed with PCP - He declined social work referral at this time - PLAN: I have reached out to PCP to inform her of patient's concerns.  Patient is aware that he can contact either of our offices if he decides he would like social work referral in the future.   4.  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  No chemicals exposure.  Quit smoking pipe in 2005.  He smoked pipe for 35 years. - No family history of thrombocytopenia.  Maternal grandmother had breast cancer.  Mother could have had intestinal cancer when she had part of intestine due to resected.  PLAN SUMMARY: >> *** >> *** >> ***   [facility] **VISIT SUMMARY & IMPORTANT INSTRUCTIONS **   You were seen today by Rojelio Brenner PA-C for your ***.    *** ***  *** ***  LABS: Return in ***   OTHER TESTS: ***  MEDICATIONS: ***  FOLLOW-UP APPOINTMENT: ***     REVIEW OF SYSTEMS: ***  Review of Systems - Oncology   PHYSICAL EXAM:  ECOG PERFORMANCE STATUS: {CHL ONC ECOG ZO:[id number]} *** There were no vitals filed for this visit. There were no vitals filed for this visit. Physical Exam  PAST MEDICAL/SURGICAL HISTORY:  Past Medical History:  Diagnosis Date   Aortic atherosclerosis (HCC)    Aortic stenosis    Arthritis    Asthma    BPH (benign prostatic hypertrophy)    Cirrhosis (HCC) 2016   Colon polyps    Diabetes mellitus without complication (HCC)    TYPE 1    Diverticulosis    Dysrhythmia 04/19/2021   A-fib noted with possible A-fib RVR   Gastric ulcer    Gastric varices    right   Gastritis    GERD (gastroesophageal reflux disease)    Headache    History- pt states these were migraines that occurred in the 1980's   Heart murmur    History of  kidney stones    Hx of adenomatous colonic polyps    Hypertension    PONV (postoperative nausea and vomiting)    pt states he has never had post op nausea or vomiting   Portal hypertensive gastropathy (HCC)    Renal cyst, right    Seizures (HCC)    HYPOGLYCEMIC LAST 1 AND 1/2 YRS AGO   Sleep apnea    uses CPAP nightly   Testicle trouble    one testicle BORN WITH   Tubular adenoma of colon    Past Surgical History:  Procedure Laterality Date   BACK SURGERY     89  LOWER    CARPAL TUNNEL RELEASE Right 10/13/2015   Procedure: RIGHT CARPAL TUNNEL RELEASE;  Surgeon: Cindee Salt, MD;  Location: [facility];  Service: Orthopedics;  Laterality: Right;   CARPAL TUNNEL RELEASE Left 07/05/2016   Procedure: LEFT CARPAL TUNNEL RELEASE;  Surgeon: Cindee Salt, MD;  Location: [facility];  Service: Orthopedics;  Laterality: Left;   COLONOSCOPY WITH ESOPHAGOGASTRODUODENOSCOPY (EGD)  10/05/2022   DRUG INDUCED ENDOSCOPY N/A 07/02/2021   Procedure: DRUG INDUCED SLEEP ENDOSCOPY;  Surgeon: Christia Reading, MD;  Location: [facility] OR;  Service: ENT;  Laterality: N/A;   IMPLANTATION OF HYPOGLOSSAL NERVE STIMULATOR Right 10/01/2021   Procedure: IMPLANTATION  OF HYPOGLOSSAL NERVE STIMULATOR;  Surgeon: Christia Reading, MD;  Location: [facility] OR;  Service: ENT;  Laterality: Right;   INGUINAL HERNIA REPAIR  2003   right    KNEE ARTHROSCOPY Right 03/03/2016   LUMBAR DISC SURGERY  03/1995   Dr. Daleen Squibb, discectomy   LUMBAR LAMINECTOMY/DECOMPRESSION MICRODISCECTOMY Right 10/06/2016   Procedure: RIGHT LUMBAR THREE - LUMBAR FOUR  LAMINECTOMY, FORAMINOTOMY AND MICRODISCECTOMY;  Surgeon: Shirlean Kelly, MD;  Location: [facility] OR;  Service: Neurosurgery;  Laterality: Right;   SHOULDER SURGERY  11/28/2005   left partial   SHOULDER SURGERY  07/14/2006   RIGHT   TONSILLECTOMY  AGE 66 OR 4   TOTAL KNEE ARTHROPLASTY Right 03/07/2022   Procedure: RIGHT TOTAL KNEE ARTHROPLASTY;  Surgeon: Gean Birchwood, MD;   Location: [facility];  Service: Orthopedics;  Laterality: Right;   TOTAL KNEE ARTHROPLASTY Left 06/06/2022   Procedure: LEFT TOTAL KNEE ARTHROPLASTY;  Surgeon: Gean Birchwood, MD;  Location: [facility];  Service: Orthopedics;  Laterality: Left;   TOTAL SHOULDER ARTHROPLASTY Right 11/01/2018   Procedure: RIGHT SHOULDER REVISION TO REVERSE TOTAL SHOULDER;  Surgeon: Jones Broom, MD;  Location: [facility];  Service: Orthopedics;  Laterality: Right;  CHOICE ANESTHESIA WITH INTERSCALENE BLOCK EXPAREL, NEEDS RNFA    SOCIAL HISTORY:  Social History   Socioeconomic History   Marital status: Married    Spouse name: Not on file   Number of children: 1   Years of education: Not on file   Highest education level: Not on file  Occupational History   Occupation: retired   Tobacco Use   Smoking status: Former    Current packs/day: 0.00    Types: Cigarettes, Pipe    Start date: 1971    Quit date: 2005    Years since quitting: 19.7   Smokeless tobacco: Never   Tobacco comments:    quit 2005 smoked cigarettes for 5 yrs prior to pipe use  Vaping Use   Vaping status: Never Used  Substance and Sexual Activity   Alcohol use: Not Currently   Drug use: No   Sexual activity: Yes  Other Topics Concern   Not on file  Social History Narrative   ** Merged History Encounter **    Right handed    Social Determinants of Health   Financial Resource Strain: Low Risk  (06/09/2022)   Overall Financial Resource Strain (CARDIA)    Difficulty of Paying Living Expenses: Not hard at all  Food Insecurity: No Food Insecurity (06/06/2022)   Hunger Vital Sign    Worried About Running Out of Food in the Last Year: Never true    Ran Out of Food in the Last Year: Never true  Transportation Needs: No Transportation Needs (06/09/2022)   PRAPARE - Administrator, Civil Service (Medical): No    Lack of Transportation (Non-Medical): No  Physical Activity: Not on file  Stress: Not on file  Social Connections: Not  on file  Intimate Partner Violence: Not At Risk (06/06/2022)   Humiliation, Afraid, Rape, and Kick questionnaire    Fear of Current or Ex-Partner: No    Emotionally Abused: No    Physically Abused: No    Sexually Abused: No    FAMILY HISTORY:  Family History  Problem Relation Age of Onset   Heart failure Mother    Heart attack Mother        in her 61's   Alzheimer's disease Mother    Diabetes Mother    Asthma Father  Suicidality Father        in his 23's   Allergic rhinitis Sister    Heart failure Maternal Grandmother    Rheum arthritis Maternal Grandmother    Breast cancer Maternal Grandmother    Heart attack Maternal Grandmother        in her 63's   Colon cancer Neg Hx    Esophageal cancer Neg Hx    Pancreatic cancer Neg Hx    Liver disease Neg Hx     CURRENT MEDICATIONS:  Outpatient Encounter Medications as of 05/15/2023  Medication Sig   acetaminophen (TYLENOL) 500 MG tablet Take 1,000 mg by mouth every 6 (six) hours as needed for moderate pain.   apixaban (ELIQUIS) 5 MG TABS tablet Take 1 tablet (5 mg total) by mouth 2 (two) times daily.   atorvastatin (LIPITOR) 40 MG tablet TAKE 1 TABLET BY MOUTH DAILY   b complex vitamins tablet Take 1 tablet by mouth daily.   benzonatate (TESSALON PERLES) 100 MG capsule Take 1 capsule (100 mg total) by mouth 3 (three) times daily as needed.   Blood Glucose Monitoring Suppl (CONTOUR NEXT ONE) KIT Test BS QID and as needed Dx E11.9   cefdinir (OMNICEF) 300 MG capsule Take 1 capsule (300 mg total) by mouth 2 (two) times daily. 1 po BID   Cholecalciferol (DIALYVITE VITAMIN D 5000) 125 MCG (5000 UT) capsule Take 5,000 Units by mouth daily.   Chromium Picolinate (CHROMIUM PICOLATE PO) Take 1 tablet by mouth daily.   CINNAMON PO Take 1,080 mg by mouth daily.   Coenzyme Q10 (COQ10) 100 MG CAPS Take 100 mg by mouth daily.   Continuous Blood Gluc Sensor (FREESTYLE LIBRE SENSOR SYSTEM) MISC Check BS eight (8) times a day. Dx E10.9    diltiazem (CARDIZEM) 30 MG tablet TAKE 1 TABLET BY MOUTH TWICE  DAILY   ferrous sulfate 325 (65 FE) MG EC tablet Take 1 tablet (325 mg total) by mouth daily with breakfast.   furosemide (LASIX) 20 MG tablet TAKE 1 AND 1/2 TABLETS BY MOUTH  DAILY   Glucagon, rDNA, (GLUCAGON EMERGENCY) 1 MG KIT INJECT AS DIRECTED INTO  UPPER ARM, THIGH OR  BUTTOCKS AS NEEDED FOR  SEVERE HYPOGLYCEMIA. SEEK  MEDICAL ATTENTION AFTER USE (Patient taking differently: Inject 1 mg into the muscle as needed (HYPOGLYCEMIA).)   Glucosamine-Chondroit-Vit C-Mn (GLUCOSAMINE 1500 COMPLEX) CAPS Take 2 capsules by mouth daily.   glucose blood (CONTOUR NEXT TEST) test strip Test BS QID and as needed Dx E11.9   insulin lispro (HUMALOG) 100 UNIT/ML injection INJECT SUBCUTANEOUSLY 20 TO 50  UNITS 3 TIMES DAILY BEFORE MEALS PER SLIDING SCALE (Patient taking differently: Inject 10-30 Units into the skin See admin instructions. Inject 10-30 units up to 7 times daily as needed for high blood sugar)   Magnesium 250 MG TABS Take 250 mg by mouth daily.   METAMUCIL FIBER PO Take 1 capsule by mouth 2 (two) times daily.   Microlet Lancets MISC Test BS QID and as needed Dx E11.9   montelukast (SINGULAIR) 10 MG tablet TAKE 1 TABLET BY MOUTH AT  BEDTIME   Multiple Vitamins-Minerals (EYE HEALTH) CAPS Take 1 capsule by mouth daily.   oxyCODONE (OXY IR/ROXICODONE) 5 MG immediate release tablet Take 5 mg by mouth 2 (two) times daily as needed.   pantoprazole (PROTONIX) 40 MG tablet TAKE 1 TABLET BY MOUTH TWICE  DAILY BEFORE MEALS   potassium chloride SA (KLOR-CON M) 20 MEQ tablet TAKE ONE TABLET BY MOUTH DAILY  ramelteon (ROZEREM) 8 MG tablet Take 1 tablet (8 mg total) by mouth at bedtime.   tiZANidine (ZANAFLEX) 2 MG tablet Take 2 mg by mouth every 8 (eight) hours as needed for muscle spasms.   TOUJEO MAX SOLOSTAR 300 UNIT/ML Solostar Pen INJECT SUBCUTANEOUSLY 110 UNITS  DAILY (Patient taking differently: Inject 70 Units into the skin at bedtime.)    traZODone (DESYREL) 50 MG tablet Take 0.5-1 tablets (25-50 mg total) by mouth at bedtime as needed for sleep.   Turmeric Curcumin 500 MG CAPS Take 500 mg by mouth daily.   valsartan (DIOVAN) 40 MG tablet Take 1 tablet (40 mg total) by mouth daily.   No facility-administered encounter medications on file as of 05/15/2023.    ALLERGIES:  Allergies  Allergen Reactions   Dimetapp Children's Cold-Cough     Chest discomfort    Erythromycin Diarrhea   Sulfonamide Derivatives Diarrhea    LABORATORY DATA:  I have reviewed the labs as listed.  CBC    Component Value Date/Time   WBC 5.7 04/19/2023 1033   RBC 4.17 (L) 04/19/2023 1033   HGB 14.4 04/19/2023 1033   HGB 11.2 (L) 01/09/2023 1001   HCT 43.8 04/19/2023 1033   HCT 35.9 (L) 01/09/2023 1001   PLT 114 (L) 04/19/2023 1033   PLT 131 (L) 01/09/2023 1001   MCV 105.0 (H) 04/19/2023 1033   MCV 98 (H) 01/09/2023 1001   MCH 34.5 (H) 04/19/2023 1033   MCHC 32.9 04/19/2023 1033   RDW 16.6 (H) 04/19/2023 1033   RDW 14.3 01/09/2023 1001   LYMPHSABS 1.4 04/19/2023 1033   LYMPHSABS 1.2 01/09/2023 1001   MONOABS 1.0 04/19/2023 1033   EOSABS 0.2 04/19/2023 1033   EOSABS 0.1 01/09/2023 1001   BASOSABS 0.1 04/19/2023 1033   BASOSABS 0.0 01/09/2023 1001      Latest Ref Rng & Units 04/19/2023   10:33 AM 01/09/2023   10:01 AM 01/09/2023   10:00 AM  CMP  Glucose 70 - 99 mg/dL 010  272  536   BUN 8 - 23 mg/dL 12  15  15    Creatinine 0.61 - 1.24 mg/dL 6.44  0.34  7.42   Sodium 135 - 145 mmol/L 139  144  144   Potassium 3.5 - 5.1 mmol/L 3.7  3.0  3.1   Chloride 98 - 111 mmol/L 105  107  107   CO2 22 - 32 mmol/L 26  22  24    Calcium 8.9 - 10.3 mg/dL 9.2  9.6  9.4   Total Protein 6.5 - 8.1 g/dL 6.9  6.9    Total Bilirubin 0.3 - 1.2 mg/dL 1.1  0.8    Alkaline Phos 38 - 126 U/L 82  96    AST 15 - 41 U/L 47  37    ALT 0 - 44 U/L 42  30      DIAGNOSTIC IMAGING:  I have independently reviewed the relevant imaging and discussed with the  patient.   WRAP UP:  All questions were answered. The patient knows to call the clinic with any problems, questions or concerns.  Medical decision making: ***  Time spent on visit: I spent *** minutes counseling the patient face to face. The total time spent in the appointment was *** minutes and more than 50% was on counseling.  Carnella Guadalajara, PA-C  ***

## 2023-05-15 ENCOUNTER — Inpatient Hospital Stay: Payer: BC Managed Care – PPO | Admitting: Physician Assistant

## 2023-05-15 NOTE — Progress Notes (Unsigned)
[name] [address] [place], [place] [address]   CLINIC:  Medical Oncology/Hematology  PCP:  Tony Ip, DO [address] [place] [address] [phone]   REASON FOR VISIT:  Follow-up for thrombocytopenia and iron deficiency anemia  CURRENT THERAPY: Surveillance  INTERVAL HISTORY:   Tony Cantu 72 y.o. male returns for routine follow-up of thrombocytopenia and iron deficiency anemia.  At today's visit, he reports feeling fair.  No recent hospitalizations, surgeries, or changes in baseline health status.  He remains very focussed on his relational struggles in his home life.  He bruises easily.  Patient denies any rectal bleeding, but notes occasional "dark stools."  He reports ongoing fatigue, but slightly improved after starting iron tablet in May 2024.  He denies any pica, lightheadedness, syncope, headaches, chest pain, or dyspnea on exertion. He denies any unintentional weight loss or B symptoms.  He has 70% energy and 80% appetite. He endorses that he is maintaining a stable weight.  ASSESSMENT & PLAN:  1.  Mild thrombocytopenia, likely secondary to cirrhosis -  Seen at the request of Dr. Nadine Counts for mild thrombocytopenia since September 2020. - No history of bleeding.  Mild bruising.  Denies any B symptoms. - Patient started on Eliquis for paroxysmal atrial fibrillation since mid January 2023. - No prior history of blood transfusions. - No history of RA, lupus, or ulcerative colitis/Crohn's disease. - CT abdomen on 04/28/2021 with cirrhosis.  Spleen size normal. - His previous lab reports indicate possible clumping of platelets contributing to mild thrombocytopenia. - Hematology work-up (12/10/2021): Pathology smear review confirms mild thrombocytopenia. Negative hepatitis B and C.  Negative H. pylori. Normal rheumatoid factor, ANA, LDH. No nutritional deficiencies.  Normal B12, folate, methylmalonic acid, copper. SPEP and reticulocytes  within normal limits.  - Most recent labs (04/19/2023): Platelets 114 (at baseline) - We discussed differential diagnosis for thrombocytopenia.  Etiology favors liver disease, but could also be immune mediated. - PLAN: No indication for treatment at this time. - We will continue to monitor platelets with labs every 6 to 12 months. - If any significant changes in baseline blood counts, would consider bone marrow biopsy. - If platelets less than 30, would consider treatment, including trial of steroids to see if there is any improvement.   2.  Macrocytic anemia - Mild anemia noted with CBC/D from 01/04/2023 revealing Hgb 10.7.  Macrocytosis likely from liver cirrhosis. 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  Colonoscopy revealed multiple polyps, diverticulosis, internal hemorrhoids. - Hematology workup (01/09/2023): Ferritin 37, iron saturation 14% Creatinine 1.14/GFR 68 Normal folate, B12, MMA, copper Immunofixation with polyclonal increase in immunoglobulins, elevated kappa free light chain 114.5, elevated lambda free light chain 36.0, elevated FLC ratio 3.18.  SPEP negative for M spike. LDH elevated 259.  Bilirubin normal 0.8.  Reticulocytes 2.2%. - Most recent labs (04/19/2023): Hgb 14.4/MCV 105.0.  Ferritin 35, iron saturation 44%. GFR >60 as evidenced by creatinine 1.17 with normal Cystatin C 1.09 - Patient denies any rectal bleeding, but does have some dark stools from time to time - He reports fatigue, which is improved after taking iron tablets - PLAN: Although patient has persistent iron deficiency, his hemoglobin has significantly improved.  Recommend to continue ferrous sulfate 325 mg daily with stool softener.   - Labs (CBC/D, ferritin, iron/TIBC, CMP, LDH, myeloma panel) + OFFICE visit in 3 months (1 week  after labs) - Will plan on  rechecking light chains/MGUS panel in 3 months, along with 24-hour urine   3. Other concerns - Home safety topics were addressed at today's visit (05/16/23) - Patient reports he has also discussed with PCP - He declined social work referral at this time - PLAN: I have reached out to PCP to inform her of patient's concerns.  Patient is aware that he can contact either of our offices if he decides he would like social work referral in the future.   4.  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  No chemicals exposure.  Quit smoking pipe in 2005.  He smoked pipe for 35 years. - No family history of thrombocytopenia.  Maternal grandmother had breast cancer.  Mother could have had intestinal cancer when she had part of intestine due to resected.  PLAN SUMMARY: >> 24-hour urine >> Labs in 3 months = CBC/D, ferritin, iron/TIBC, CMP, LDH, immunofixation, SPEP, light chains >> OFFICE visit in 3 months (1 week after labs)     REVIEW OF SYSTEMS:   Review of Systems  Constitutional:  Positive for fatigue. Negative for appetite change, chills, diaphoresis, fever and unexpected weight change.  HENT:   Negative for lump/mass and nosebleeds.   Eyes:  Negative for eye problems.  Respiratory:  Positive for cough. Negative for hemoptysis and shortness of breath.   Cardiovascular:  Negative for chest pain, leg swelling and palpitations.  Gastrointestinal:  Negative for abdominal pain, blood in stool, constipation, diarrhea, nausea and vomiting.  Genitourinary:  Positive for nocturia. Negative for hematuria.   Musculoskeletal:  Positive for arthralgias.  Skin: Negative.   Neurological:  Negative for dizziness, headaches and light-headedness.  Hematological:  Does not bruise/bleed easily.  Psychiatric/Behavioral:  Positive for sleep disturbance. The patient is nervous/anxious.      PHYSICAL EXAM:  ECOG PERFORMANCE STATUS: 1 - Symptomatic but completely ambulatory  Vitals:   05/16/23  0909  BP: 118/73  Pulse: 79  Resp: 18  Temp: 98.2 F (36.8 C)  SpO2: 99%   Filed Weights   05/16/23 0909  Weight: 217 lb 9.6 oz (98.7 kg)   Physical Exam Constitutional:      Appearance: Normal appearance. He is obese.  Cardiovascular:     Heart sounds: Murmur heard.  Pulmonary:     Breath sounds: Normal breath sounds.  Neurological:     General: No focal deficit present.     Mental Status: Mental status is at baseline.  Psychiatric:        Behavior: Behavior normal. Behavior is cooperative.     PAST MEDICAL/SURGICAL HISTORY:  Past Medical History:  Diagnosis Date   Aortic atherosclerosis (HCC)    Aortic stenosis    Arthritis    Asthma    BPH (benign prostatic hypertrophy)    Cirrhosis (HCC) 2016   Colon polyps    Diabetes mellitus without complication (HCC)    TYPE 1    Diverticulosis    Dysrhythmia 04/19/2021   A-fib noted with possible A-fib RVR   Gastric ulcer    Gastric varices    right   Gastritis    GERD (gastroesophageal reflux disease)    Headache    History- pt states these were migraines that occurred in the 1980's   Heart murmur    History of kidney stones    Hx of adenomatous colonic polyps    Hypertension    PONV (postoperative nausea and vomiting)    pt states he has  never had post op nausea or vomiting   Portal hypertensive gastropathy (HCC)    Renal cyst, right    Seizures (HCC)    HYPOGLYCEMIC LAST 1 AND 1/2 YRS AGO   Sleep apnea    uses CPAP nightly   Testicle trouble    one testicle BORN WITH   Tubular adenoma of colon    Past Surgical History:  Procedure Laterality Date   BACK SURGERY     89  LOWER    CARPAL TUNNEL RELEASE Right 10/13/2015   Procedure: RIGHT CARPAL TUNNEL RELEASE;  Surgeon: Cindee Salt, MD;  Location: [facility];  Service: Orthopedics;  Laterality: Right;   CARPAL TUNNEL RELEASE Left 07/05/2016   Procedure: LEFT CARPAL TUNNEL RELEASE;  Surgeon: Cindee Salt, MD;  Location: [facility];  Service: Orthopedics;  Laterality: Left;   COLONOSCOPY WITH ESOPHAGOGASTRODUODENOSCOPY (EGD)  10/05/2022   DRUG INDUCED ENDOSCOPY N/A 07/02/2021   Procedure: DRUG INDUCED SLEEP ENDOSCOPY;  Surgeon: Christia Reading, MD;  Location: [facility] OR;  Service: ENT;  Laterality: N/A;   IMPLANTATION OF HYPOGLOSSAL NERVE STIMULATOR Right 10/01/2021   Procedure: IMPLANTATION OF HYPOGLOSSAL NERVE STIMULATOR;  Surgeon: Christia Reading, MD;  Location: [facility] OR;  Service: ENT;  Laterality: Right;   INGUINAL HERNIA REPAIR  2003   right    KNEE ARTHROSCOPY Right 03/03/2016   LUMBAR DISC SURGERY  03/1995   Dr. Daleen Squibb, discectomy   LUMBAR LAMINECTOMY/DECOMPRESSION MICRODISCECTOMY Right 10/06/2016   Procedure: RIGHT LUMBAR THREE - LUMBAR FOUR  LAMINECTOMY, FORAMINOTOMY AND MICRODISCECTOMY;  Surgeon: Shirlean Kelly, MD;  Location: [facility] OR;  Service: Neurosurgery;  Laterality: Right;   SHOULDER SURGERY  11/28/2005   left partial   SHOULDER SURGERY  07/14/2006   RIGHT   TONSILLECTOMY  AGE 61 OR 4   TOTAL KNEE ARTHROPLASTY Right 03/07/2022   Procedure: RIGHT TOTAL KNEE ARTHROPLASTY;  Surgeon: Gean Birchwood, MD;  Location: [facility];  Service: Orthopedics;  Laterality: Right;   TOTAL KNEE ARTHROPLASTY Left 06/06/2022   Procedure: LEFT TOTAL KNEE ARTHROPLASTY;  Surgeon: Gean Birchwood, MD;  Location: [facility];  Service: Orthopedics;  Laterality: Left;   TOTAL SHOULDER ARTHROPLASTY Right 11/01/2018   Procedure: RIGHT SHOULDER REVISION TO REVERSE TOTAL SHOULDER;  Surgeon: Jones Broom, MD;  Location: [facility];  Service: Orthopedics;  Laterality: Right;  CHOICE ANESTHESIA WITH INTERSCALENE BLOCK EXPAREL, NEEDS RNFA    SOCIAL HISTORY:  Social History   Socioeconomic History   Marital status: Married    Spouse name: Not on file   Number of children: 1   Years of education: Not on file   Highest education level: Not on file  Occupational History   Occupation: retired   Tobacco Use   Smoking status: Former    Current  packs/day: 0.00    Types: Cigarettes, Pipe    Start date: 1971    Quit date: 2005    Years since quitting: 19.7   Smokeless tobacco: Never   Tobacco comments:    quit 2005 smoked cigarettes for 5 yrs prior to pipe use  Vaping Use   Vaping status: Never Used  Substance and Sexual Activity   Alcohol use: Not Currently   Drug use: No   Sexual activity: Yes  Other Topics Concern   Not on file  Social History Narrative   ** Merged History Encounter **    Right handed    Social Determinants of Health   Financial Resource Strain: Low Risk  (06/09/2022)   Overall Financial Resource  Strain (CARDIA)    Difficulty of Paying Living Expenses: Not hard at all  Food Insecurity: No Food Insecurity (06/06/2022)   Hunger Vital Sign    Worried About Running Out of Food in the Last Year: Never true    Ran Out of Food in the Last Year: Never true  Transportation Needs: No Transportation Needs (06/09/2022)   PRAPARE - Administrator, Civil Service (Medical): No    Lack of Transportation (Non-Medical): No  Physical Activity: Not on file  Stress: Not on file  Social Connections: Not on file  Intimate Partner Violence: Not At Risk (06/06/2022)   Humiliation, Afraid, Rape, and Kick questionnaire    Fear of Current or Ex-Partner: No    Emotionally Abused: No    Physically Abused: No    Sexually Abused: No    FAMILY HISTORY:  Family History  Problem Relation Age of Onset   Heart failure Mother    Heart attack Mother        in her 35's   Alzheimer's disease Mother    Diabetes Mother    Asthma Father    Suicidality Father        in his 89's   Allergic rhinitis Sister    Heart failure Maternal Grandmother    Rheum arthritis Maternal Grandmother    Breast cancer Maternal Grandmother    Heart attack Maternal Grandmother        in her 13's   Colon cancer Neg Hx    Esophageal cancer Neg Hx    Pancreatic cancer Neg Hx    Liver disease Neg Hx     CURRENT MEDICATIONS:   Outpatient Encounter Medications as of 05/16/2023  Medication Sig   acetaminophen (TYLENOL) 500 MG tablet Take 1,000 mg by mouth every 6 (six) hours as needed for moderate pain.   apixaban (ELIQUIS) 5 MG TABS tablet Take 1 tablet (5 mg total) by mouth 2 (two) times daily.   atorvastatin (LIPITOR) 40 MG tablet TAKE 1 TABLET BY MOUTH DAILY   b complex vitamins tablet Take 1 tablet by mouth daily.   benzonatate (TESSALON PERLES) 100 MG capsule Take 1 capsule (100 mg total) by mouth 3 (three) times daily as needed.   Blood Glucose Monitoring Suppl (CONTOUR NEXT ONE) KIT Test BS QID and as needed Dx E11.9   cefdinir (OMNICEF) 300 MG capsule Take 1 capsule (300 mg total) by mouth 2 (two) times daily. 1 po BID   Cholecalciferol (DIALYVITE VITAMIN D 5000) 125 MCG (5000 UT) capsule Take 5,000 Units by mouth daily.   Chromium Picolinate (CHROMIUM PICOLATE PO) Take 1 tablet by mouth daily.   CINNAMON PO Take 1,080 mg by mouth daily.   Coenzyme Q10 (COQ10) 100 MG CAPS Take 100 mg by mouth daily.   Continuous Blood Gluc Sensor (FREESTYLE LIBRE SENSOR SYSTEM) MISC Check BS eight (8) times a day. Dx E10.9   diltiazem (CARDIZEM) 30 MG tablet TAKE 1 TABLET BY MOUTH TWICE  DAILY   ferrous sulfate 325 (65 FE) MG EC tablet Take 1 tablet (325 mg total) by mouth daily with breakfast.   furosemide (LASIX) 20 MG tablet TAKE 1 AND 1/2 TABLETS BY MOUTH  DAILY   Glucagon, rDNA, (GLUCAGON EMERGENCY) 1 MG KIT INJECT AS DIRECTED INTO  UPPER ARM, THIGH OR  BUTTOCKS AS NEEDED FOR  SEVERE HYPOGLYCEMIA. SEEK  MEDICAL ATTENTION AFTER USE (Patient taking differently: Inject 1 mg into the muscle as needed (HYPOGLYCEMIA).)   Glucosamine-Chondroit-Vit C-Mn (GLUCOSAMINE 1500 COMPLEX)  CAPS Take 2 capsules by mouth daily.   glucose blood (CONTOUR NEXT TEST) test strip Test BS QID and as needed Dx E11.9   insulin lispro (HUMALOG) 100 UNIT/ML injection INJECT SUBCUTANEOUSLY 20 TO 50  UNITS 3 TIMES DAILY BEFORE MEALS PER SLIDING SCALE  (Patient taking differently: Inject 10-30 Units into the skin See admin instructions. Inject 10-30 units up to 7 times daily as needed for high blood sugar)   Magnesium 250 MG TABS Take 250 mg by mouth daily.   METAMUCIL FIBER PO Take 1 capsule by mouth 2 (two) times daily.   Microlet Lancets MISC Test BS QID and as needed Dx E11.9   montelukast (SINGULAIR) 10 MG tablet TAKE 1 TABLET BY MOUTH AT  BEDTIME   Multiple Vitamins-Minerals (EYE HEALTH) CAPS Take 1 capsule by mouth daily.   oxyCODONE (OXY IR/ROXICODONE) 5 MG immediate release tablet Take 5 mg by mouth 2 (two) times daily as needed.   pantoprazole (PROTONIX) 40 MG tablet TAKE 1 TABLET BY MOUTH TWICE  DAILY BEFORE MEALS   potassium chloride SA (KLOR-CON M) 20 MEQ tablet TAKE ONE TABLET BY MOUTH DAILY   ramelteon (ROZEREM) 8 MG tablet Take 1 tablet (8 mg total) by mouth at bedtime.   tiZANidine (ZANAFLEX) 2 MG tablet Take 2 mg by mouth every 8 (eight) hours as needed for muscle spasms.   TOUJEO MAX SOLOSTAR 300 UNIT/ML Solostar Pen INJECT SUBCUTANEOUSLY 110 UNITS  DAILY (Patient taking differently: Inject 70 Units into the skin at bedtime.)   traZODone (DESYREL) 50 MG tablet Take 0.5-1 tablets (25-50 mg total) by mouth at bedtime as needed for sleep.   Turmeric Curcumin 500 MG CAPS Take 500 mg by mouth daily.   valsartan (DIOVAN) 40 MG tablet Take 1 tablet (40 mg total) by mouth daily.   No facility-administered encounter medications on file as of 05/16/2023.    ALLERGIES:  Allergies  Allergen Reactions   Dimetapp Children's Cold-Cough     Chest discomfort    Erythromycin Diarrhea   Sulfonamide Derivatives Diarrhea    LABORATORY DATA:  I have reviewed the labs as listed.  CBC    Component Value Date/Time   WBC 5.7 04/19/2023 1033   RBC 4.17 (L) 04/19/2023 1033   HGB 14.4 04/19/2023 1033   HGB 11.2 (L) 01/09/2023 1001   HCT 43.8 04/19/2023 1033   HCT 35.9 (L) 01/09/2023 1001   PLT 114 (L) 04/19/2023 1033   PLT 131 (L)  01/09/2023 1001   MCV 105.0 (H) 04/19/2023 1033   MCV 98 (H) 01/09/2023 1001   MCH 34.5 (H) 04/19/2023 1033   MCHC 32.9 04/19/2023 1033   RDW 16.6 (H) 04/19/2023 1033   RDW 14.3 01/09/2023 1001   LYMPHSABS 1.4 04/19/2023 1033   LYMPHSABS 1.2 01/09/2023 1001   MONOABS 1.0 04/19/2023 1033   EOSABS 0.2 04/19/2023 1033   EOSABS 0.1 01/09/2023 1001   BASOSABS 0.1 04/19/2023 1033   BASOSABS 0.0 01/09/2023 1001      Latest Ref Rng & Units 04/19/2023   10:33 AM 01/09/2023   10:01 AM 01/09/2023   10:00 AM  CMP  Glucose 70 - 99 mg/dL 782  956  213   BUN 8 - 23 mg/dL 12  15  15    Creatinine 0.61 - 1.24 mg/dL 0.86  5.78  4.69   Sodium 135 - 145 mmol/L 139  144  144   Potassium 3.5 - 5.1 mmol/L 3.7  3.0  3.1   Chloride 98 - 111 mmol/L  105  107  107   CO2 22 - 32 mmol/L 26  22  24    Calcium 8.9 - 10.3 mg/dL 9.2  9.6  9.4   Total Protein 6.5 - 8.1 g/dL 6.9  6.9    Total Bilirubin 0.3 - 1.2 mg/dL 1.1  0.8    Alkaline Phos 38 - 126 U/L 82  96    AST 15 - 41 U/L 47  37    ALT 0 - 44 U/L 42  30      DIAGNOSTIC IMAGING:  I have independently reviewed the relevant imaging and discussed with the patient.   WRAP UP:  All questions were answered. The patient knows to call the clinic with any problems, questions or concerns.  Medical decision making: Moderate  Time spent on visit: I spent 20 minutes counseling the patient face to face. The total time spent in the appointment was 30 minutes and more than 50% was on counseling.  Carnella Guadalajara, PA-C  05/16/23 9:49 AM

## 2023-05-16 ENCOUNTER — Inpatient Hospital Stay: Payer: BC Managed Care – PPO | Attending: Physician Assistant | Admitting: Physician Assistant

## 2023-05-16 ENCOUNTER — Telehealth: Payer: Self-pay

## 2023-05-16 VITALS — BP 118/73 | HR 79 | Temp 98.2°F | Resp 18 | Ht 68.0 in | Wt 217.6 lb

## 2023-05-16 DIAGNOSIS — D7589 Other specified diseases of blood and blood-forming organs: Secondary | ICD-10-CM | POA: Insufficient documentation

## 2023-05-16 DIAGNOSIS — D500 Iron deficiency anemia secondary to blood loss (chronic): Secondary | ICD-10-CM | POA: Diagnosis not present

## 2023-05-16 DIAGNOSIS — D696 Thrombocytopenia, unspecified: Secondary | ICD-10-CM | POA: Diagnosis not present

## 2023-05-16 DIAGNOSIS — K7460 Unspecified cirrhosis of liver: Secondary | ICD-10-CM | POA: Diagnosis not present

## 2023-05-16 DIAGNOSIS — Z8719 Personal history of other diseases of the digestive system: Secondary | ICD-10-CM | POA: Insufficient documentation

## 2023-05-16 DIAGNOSIS — J45909 Unspecified asthma, uncomplicated: Secondary | ICD-10-CM | POA: Insufficient documentation

## 2023-05-16 DIAGNOSIS — D649 Anemia, unspecified: Secondary | ICD-10-CM

## 2023-05-16 DIAGNOSIS — Z87442 Personal history of urinary calculi: Secondary | ICD-10-CM | POA: Diagnosis not present

## 2023-05-16 DIAGNOSIS — I10 Essential (primary) hypertension: Secondary | ICD-10-CM | POA: Diagnosis not present

## 2023-05-16 DIAGNOSIS — K3189 Other diseases of stomach and duodenum: Secondary | ICD-10-CM | POA: Diagnosis present

## 2023-05-16 DIAGNOSIS — I480 Paroxysmal atrial fibrillation: Secondary | ICD-10-CM | POA: Diagnosis not present

## 2023-05-16 DIAGNOSIS — I350 Nonrheumatic aortic (valve) stenosis: Secondary | ICD-10-CM | POA: Insufficient documentation

## 2023-05-16 DIAGNOSIS — N281 Cyst of kidney, acquired: Secondary | ICD-10-CM | POA: Insufficient documentation

## 2023-05-16 DIAGNOSIS — R768 Other specified abnormal immunological findings in serum: Secondary | ICD-10-CM | POA: Diagnosis not present

## 2023-05-16 DIAGNOSIS — E119 Type 2 diabetes mellitus without complications: Secondary | ICD-10-CM | POA: Insufficient documentation

## 2023-05-16 DIAGNOSIS — Z803 Family history of malignant neoplasm of breast: Secondary | ICD-10-CM | POA: Insufficient documentation

## 2023-05-16 DIAGNOSIS — D539 Nutritional anemia, unspecified: Secondary | ICD-10-CM | POA: Insufficient documentation

## 2023-05-16 DIAGNOSIS — Z882 Allergy status to sulfonamides status: Secondary | ICD-10-CM | POA: Diagnosis not present

## 2023-05-16 DIAGNOSIS — Z86010 Personal history of colonic polyps: Secondary | ICD-10-CM | POA: Insufficient documentation

## 2023-05-16 DIAGNOSIS — Z9089 Acquired absence of other organs: Secondary | ICD-10-CM | POA: Insufficient documentation

## 2023-05-16 DIAGNOSIS — D509 Iron deficiency anemia, unspecified: Secondary | ICD-10-CM | POA: Diagnosis not present

## 2023-05-16 DIAGNOSIS — Z881 Allergy status to other antibiotic agents status: Secondary | ICD-10-CM | POA: Diagnosis not present

## 2023-05-16 DIAGNOSIS — Z825 Family history of asthma and other chronic lower respiratory diseases: Secondary | ICD-10-CM | POA: Insufficient documentation

## 2023-05-16 DIAGNOSIS — G4730 Sleep apnea, unspecified: Secondary | ICD-10-CM | POA: Diagnosis not present

## 2023-05-16 DIAGNOSIS — Z833 Family history of diabetes mellitus: Secondary | ICD-10-CM | POA: Insufficient documentation

## 2023-05-16 DIAGNOSIS — Z87891 Personal history of nicotine dependence: Secondary | ICD-10-CM | POA: Insufficient documentation

## 2023-05-16 DIAGNOSIS — Z8249 Family history of ischemic heart disease and other diseases of the circulatory system: Secondary | ICD-10-CM | POA: Insufficient documentation

## 2023-05-16 DIAGNOSIS — K31811 Angiodysplasia of stomach and duodenum with bleeding: Secondary | ICD-10-CM | POA: Diagnosis not present

## 2023-05-16 DIAGNOSIS — Z8261 Family history of arthritis: Secondary | ICD-10-CM | POA: Insufficient documentation

## 2023-05-16 DIAGNOSIS — Z79899 Other long term (current) drug therapy: Secondary | ICD-10-CM | POA: Insufficient documentation

## 2023-05-16 DIAGNOSIS — K648 Other hemorrhoids: Secondary | ICD-10-CM | POA: Insufficient documentation

## 2023-05-16 DIAGNOSIS — Z7901 Long term (current) use of anticoagulants: Secondary | ICD-10-CM | POA: Diagnosis not present

## 2023-05-16 DIAGNOSIS — K5730 Diverticulosis of large intestine without perforation or abscess without bleeding: Secondary | ICD-10-CM | POA: Diagnosis not present

## 2023-05-16 NOTE — Telephone Encounter (Signed)
I was able to reach the patient.  Nothing further needed at this time.

## 2023-05-16 NOTE — Patient Instructions (Signed)
[facility] at [facility] **VISIT SUMMARY & IMPORTANT INSTRUCTIONS **   You were seen today by Rojelio Brenner PA-C for your follow-up visit.    IRON DEFICIENCY ANEMIA Your blood and iron levels look better! Continue to take your iron tablet (ferrous sulfate 325 mg) daily along with stool softener.  LOW PLATELETS Your low platelets are related to your liver cirrhosis. Platelets remain mildly low but stable. You do not need any treatment for your low platelets at this time.  OTHER TESTS: - We will check labs in 3 months. - We will also check a 24-hour urine study.  Kit has been provided for you. FIRST MORNING: Discard first urine of the morning into the toilet. Collect the rest of your urine in the orange jug for the next 24 hours. SECOND MORNING: Collect your first urine of the morning in the jug.  This ends your 24-hour urine collection. **Store the urine jug in the refrigerator in between trips to the bathroom. Return urine jug to fourth floor front desk as soon as it is completed.   FOLLOW-UP APPOINTMENT: Office visit in 3 months  ** Thank you for trusting me with your healthcare!  I strive to provide all of my patients with quality care at each visit.  If you receive a survey for this visit, I would be so grateful to you for taking the time to provide feedback.  Thank you in advance!  ~ Krishang Reading                   Dr. Doreatha Massed   &   Rojelio Brenner, PA-C   - - - - - - - - - - - - - - - - - -    Thank you for choosing [facility] at [facility] to provide your oncology and hematology care.  To afford each patient quality time with our provider, please arrive at least 15 minutes before your scheduled appointment time.   If you have a lab appointment with the Cancer Center please come in thru the Main Entrance and check in at the main information desk.  You need to re-schedule your appointment should you arrive 10 or  more minutes late.  We strive to give you quality time with our providers, and arriving late affects you and other patients whose appointments are after yours.  Also, if you no show three or more times for appointments you may be dismissed from the clinic at the providers discretion.     Again, thank you for choosing [facility].  Our hope is that these requests will decrease the amount of time that you wait before being seen by our physicians.       _____________________________________________________________  Should you have questions after your visit to [facility], please contact our office at [phone] and follow the prompts.  Our office hours are 8:00 a.m. and 4:30 p.m. Monday - Friday.  Please note that voicemails left after 4:00 p.m. may not be returned until the following business day.  We are closed weekends and major holidays.  You do have access to a nurse 24-7, just call the main number to the clinic [phone] and do not press any options, hold on the line and a nurse will answer the phone.    For prescription refill requests, have your pharmacy contact our office and allow 72 hours.

## 2023-05-17 ENCOUNTER — Ambulatory Visit (HOSPITAL_BASED_OUTPATIENT_CLINIC_OR_DEPARTMENT_OTHER): Payer: BC Managed Care – PPO | Admitting: Pulmonary Disease

## 2023-05-17 ENCOUNTER — Ambulatory Visit: Payer: BC Managed Care – PPO | Admitting: Adult Health

## 2023-05-17 DIAGNOSIS — G4733 Obstructive sleep apnea (adult) (pediatric): Secondary | ICD-10-CM

## 2023-05-23 ENCOUNTER — Other Ambulatory Visit: Payer: Self-pay

## 2023-05-23 DIAGNOSIS — J45909 Unspecified asthma, uncomplicated: Secondary | ICD-10-CM | POA: Diagnosis not present

## 2023-05-23 DIAGNOSIS — G4730 Sleep apnea, unspecified: Secondary | ICD-10-CM | POA: Diagnosis not present

## 2023-05-23 DIAGNOSIS — D696 Thrombocytopenia, unspecified: Secondary | ICD-10-CM | POA: Diagnosis not present

## 2023-05-23 DIAGNOSIS — I480 Paroxysmal atrial fibrillation: Secondary | ICD-10-CM | POA: Diagnosis not present

## 2023-05-23 DIAGNOSIS — E119 Type 2 diabetes mellitus without complications: Secondary | ICD-10-CM | POA: Diagnosis not present

## 2023-05-23 DIAGNOSIS — R768 Other specified abnormal immunological findings in serum: Secondary | ICD-10-CM

## 2023-05-23 DIAGNOSIS — K31811 Angiodysplasia of stomach and duodenum with bleeding: Secondary | ICD-10-CM | POA: Diagnosis not present

## 2023-05-23 DIAGNOSIS — D509 Iron deficiency anemia, unspecified: Secondary | ICD-10-CM | POA: Diagnosis not present

## 2023-05-23 DIAGNOSIS — K5730 Diverticulosis of large intestine without perforation or abscess without bleeding: Secondary | ICD-10-CM | POA: Diagnosis not present

## 2023-05-23 DIAGNOSIS — K7460 Unspecified cirrhosis of liver: Secondary | ICD-10-CM | POA: Diagnosis not present

## 2023-05-23 DIAGNOSIS — I10 Essential (primary) hypertension: Secondary | ICD-10-CM | POA: Diagnosis not present

## 2023-05-23 DIAGNOSIS — D539 Nutritional anemia, unspecified: Secondary | ICD-10-CM | POA: Diagnosis not present

## 2023-05-23 DIAGNOSIS — K648 Other hemorrhoids: Secondary | ICD-10-CM | POA: Diagnosis not present

## 2023-05-23 DIAGNOSIS — Z7901 Long term (current) use of anticoagulants: Secondary | ICD-10-CM | POA: Diagnosis not present

## 2023-05-23 DIAGNOSIS — D7589 Other specified diseases of blood and blood-forming organs: Secondary | ICD-10-CM | POA: Diagnosis not present

## 2023-05-23 DIAGNOSIS — I350 Nonrheumatic aortic (valve) stenosis: Secondary | ICD-10-CM | POA: Diagnosis not present

## 2023-05-23 DIAGNOSIS — N281 Cyst of kidney, acquired: Secondary | ICD-10-CM | POA: Diagnosis not present

## 2023-05-26 LAB — UPEP/UIFE/LIGHT CHAINS/TP, 24-HR UR
% BETA, Urine: 42.3 %
ALPHA 1 URINE: 7.8 %
Albumin, U: 26.3 %
Alpha 2, Urine: 10.2 %
Free Kappa Lt Chains,Ur: 48.5 mg/L (ref 1.17–86.46)
Free Kappa/Lambda Ratio: 9.12 (ref 1.83–14.26)
Free Lambda Lt Chains,Ur: 5.32 mg/L (ref 0.27–15.21)
GAMMA GLOBULIN URINE: 13.4 %
Total Protein, Urine-Ur/day: 158 mg/(24.h) — ABNORMAL HIGH (ref 30–150)
Total Protein, Urine: 7.2 mg/dL
Total Volume: 2200

## 2023-05-31 ENCOUNTER — Ambulatory Visit (INDEPENDENT_AMBULATORY_CARE_PROVIDER_SITE_OTHER): Payer: BC Managed Care – PPO | Admitting: Family Medicine

## 2023-05-31 ENCOUNTER — Encounter: Payer: Self-pay | Admitting: Family Medicine

## 2023-05-31 ENCOUNTER — Other Ambulatory Visit: Payer: Self-pay | Admitting: Internal Medicine

## 2023-05-31 VITALS — BP 108/63 | HR 64 | Temp 98.6°F | Ht 68.0 in | Wt 213.0 lb

## 2023-05-31 DIAGNOSIS — E1169 Type 2 diabetes mellitus with other specified complication: Secondary | ICD-10-CM | POA: Diagnosis not present

## 2023-05-31 DIAGNOSIS — N182 Chronic kidney disease, stage 2 (mild): Secondary | ICD-10-CM | POA: Diagnosis not present

## 2023-05-31 DIAGNOSIS — Z794 Long term (current) use of insulin: Secondary | ICD-10-CM | POA: Diagnosis not present

## 2023-05-31 DIAGNOSIS — J301 Allergic rhinitis due to pollen: Secondary | ICD-10-CM

## 2023-05-31 DIAGNOSIS — E1159 Type 2 diabetes mellitus with other circulatory complications: Secondary | ICD-10-CM | POA: Diagnosis not present

## 2023-05-31 DIAGNOSIS — I152 Hypertension secondary to endocrine disorders: Secondary | ICD-10-CM

## 2023-05-31 DIAGNOSIS — E1122 Type 2 diabetes mellitus with diabetic chronic kidney disease: Secondary | ICD-10-CM | POA: Diagnosis not present

## 2023-05-31 DIAGNOSIS — R600 Localized edema: Secondary | ICD-10-CM

## 2023-05-31 DIAGNOSIS — Z23 Encounter for immunization: Secondary | ICD-10-CM

## 2023-05-31 DIAGNOSIS — F5101 Primary insomnia: Secondary | ICD-10-CM

## 2023-05-31 DIAGNOSIS — Z630 Problems in relationship with spouse or partner: Secondary | ICD-10-CM

## 2023-05-31 DIAGNOSIS — E876 Hypokalemia: Secondary | ICD-10-CM | POA: Diagnosis not present

## 2023-05-31 DIAGNOSIS — E785 Hyperlipidemia, unspecified: Secondary | ICD-10-CM

## 2023-05-31 NOTE — Progress Notes (Signed)
Subjective: CC:DM PCP: [name], DO WJX:BJYNWGN W [place] is a 72 y.o. male presenting to clinic today for:  1. Type 2 Diabetes with hypertension, hyperlipidemia:  Glucometer: He uses freestyle libre for CGM.  He admits to multiple lows and after reviewing his CGM he had at 128 episodes over the last 90 days of hypoglycemia.  He has been injecting 50 units of Toujeo at night and 25 to 30 units of Humalog with meals.  He admits that 1 time his blood sugar had gotten so low that he kind of slumped down at his ex-wife's house.  They called EMS.  He denies having lost consciousness.  He is compliant with his statin, diltiazem, Lasix and potassium  Diabetes Health Maintenance Due  Topic Date Due   OPHTHALMOLOGY EXAM  07/05/2023   FOOT EXAM  08/25/2023   HEMOGLOBIN A1C  10/27/2023    Last A1c:  Lab Results  Component Value Date   HGBA1C 4.2 (L) 04/26/2023    ROS: He denies chest pain, shortness of breath, visual disturbance or edema.  2.  Marital stress He reports ongoing marital stress.  He notes that things are coming to ahead and he is strongly considering separation.  His wife actually reached out to me with concerns about his memory and reports of the recent hypoglycemic episodes that had been occurring.    ROS: Per HPI  Allergies  Allergen Reactions   Dimetapp Children's Cold-Cough     Chest discomfort    Erythromycin Diarrhea   Sulfonamide Derivatives Diarrhea   Past Medical History:  Diagnosis Date   Aortic atherosclerosis (HCC)    Aortic stenosis    Arthritis    Asthma    BPH (benign prostatic hypertrophy)    Cirrhosis (HCC) 2016   Colon polyps    Diabetes mellitus without complication (HCC)    TYPE 1    Diverticulosis    Dysrhythmia 04/19/2021   A-fib noted with possible A-fib RVR   Gastric ulcer    Gastric varices    right   Gastritis    GERD (gastroesophageal reflux disease)    Headache    History- pt states these were migraines that occurred  in the 1980's   Heart murmur    History of kidney stones    Hx of adenomatous colonic polyps    Hypertension    PONV (postoperative nausea and vomiting)    pt states he has never had post op nausea or vomiting   Portal hypertensive gastropathy (HCC)    Renal cyst, right    Seizures (HCC)    HYPOGLYCEMIC LAST 1 AND 1/2 YRS AGO   Sleep apnea    uses CPAP nightly   Testicle trouble    one testicle BORN WITH   Tubular adenoma of colon     Current Outpatient Medications:    acetaminophen (TYLENOL) 500 MG tablet, Take 1,000 mg by mouth every 6 (six) hours as needed for moderate pain., Disp: , Rfl:    apixaban (ELIQUIS) 5 MG TABS tablet, Take 1 tablet (5 mg total) by mouth 2 (two) times daily., Disp: 180 tablet, Rfl: 3   atorvastatin (LIPITOR) 40 MG tablet, TAKE 1 TABLET BY MOUTH DAILY, Disp: 90 tablet, Rfl: 0   b complex vitamins tablet, Take 1 tablet by mouth daily., Disp: , Rfl:    benzonatate (TESSALON PERLES) 100 MG capsule, Take 1 capsule (100 mg total) by mouth 3 (three) times daily as needed., Disp: 20 capsule, Rfl: 0  Blood Glucose Monitoring Suppl (CONTOUR NEXT ONE) KIT, Test BS QID and as needed Dx E11.9, Disp: 500 kit, Rfl: 3   cefdinir (OMNICEF) 300 MG capsule, Take 1 capsule (300 mg total) by mouth 2 (two) times daily. 1 po BID, Disp: 20 capsule, Rfl: 0   Cholecalciferol (DIALYVITE VITAMIN D 5000) 125 MCG (5000 UT) capsule, Take 5,000 Units by mouth daily., Disp: , Rfl:    Chromium Picolinate (CHROMIUM PICOLATE PO), Take 1 tablet by mouth daily., Disp: , Rfl:    CINNAMON PO, Take 1,080 mg by mouth daily., Disp: , Rfl:    Coenzyme Q10 (COQ10) 100 MG CAPS, Take 100 mg by mouth daily., Disp: , Rfl:    Continuous Blood Gluc Sensor (FREESTYLE LIBRE SENSOR SYSTEM) MISC, Check BS eight (8) times a day. Dx E10.9, Disp: 7 each, Rfl: 3   diltiazem (CARDIZEM) 30 MG tablet, TAKE 1 TABLET BY MOUTH TWICE  DAILY, Disp: 180 tablet, Rfl: 1   ferrous sulfate 325 (65 FE) MG EC tablet, Take 1  tablet (325 mg total) by mouth daily with breakfast., Disp: 90 tablet, Rfl: 3   furosemide (LASIX) 20 MG tablet, TAKE 1 AND 1/2 TABLETS BY MOUTH  DAILY, Disp: 135 tablet, Rfl: 0   Glucagon, rDNA, (GLUCAGON EMERGENCY) 1 MG KIT, INJECT AS DIRECTED INTO  UPPER ARM, THIGH OR  BUTTOCKS AS NEEDED FOR  SEVERE HYPOGLYCEMIA. SEEK  MEDICAL ATTENTION AFTER USE (Patient taking differently: Inject 1 mg into the muscle as needed (HYPOGLYCEMIA).), Disp: 3 kit, Rfl: 0   Glucosamine-Chondroit-Vit C-Mn (GLUCOSAMINE 1500 COMPLEX) CAPS, Take 2 capsules by mouth daily., Disp: , Rfl:    glucose blood (CONTOUR NEXT TEST) test strip, Test BS QID and as needed Dx E11.9, Disp: 400 strip, Rfl: 3   insulin lispro (HUMALOG) 100 UNIT/ML injection, INJECT SUBCUTANEOUSLY 20 TO 50  UNITS 3 TIMES DAILY BEFORE MEALS PER SLIDING SCALE (Patient taking differently: Inject 10-30 Units into the skin See admin instructions. Inject 10-30 units up to 7 times daily as needed for high blood sugar), Disp: 140 mL, Rfl: 3   Magnesium 250 MG TABS, Take 250 mg by mouth daily., Disp: , Rfl:    METAMUCIL FIBER PO, Take 1 capsule by mouth 2 (two) times daily., Disp: , Rfl:    Microlet Lancets MISC, Test BS QID and as needed Dx E11.9, Disp: 500 each, Rfl: 3   montelukast (SINGULAIR) 10 MG tablet, TAKE 1 TABLET BY MOUTH AT  BEDTIME, Disp: 90 tablet, Rfl: 3   Multiple Vitamins-Minerals (EYE HEALTH) CAPS, Take 1 capsule by mouth daily., Disp: , Rfl:    oxyCODONE (OXY IR/ROXICODONE) 5 MG immediate release tablet, Take 5 mg by mouth 2 (two) times daily as needed., Disp: , Rfl:    pantoprazole (PROTONIX) 40 MG tablet, TAKE 1 TABLET BY MOUTH TWICE  DAILY BEFORE MEALS, Disp: 180 tablet, Rfl: 3   potassium chloride SA (KLOR-CON M) 20 MEQ tablet, TAKE ONE TABLET BY MOUTH DAILY, Disp: 90 tablet, Rfl: 0   ramelteon (ROZEREM) 8 MG tablet, Take 1 tablet (8 mg total) by mouth at bedtime., Disp: 90 tablet, Rfl: 3   tiZANidine (ZANAFLEX) 2 MG tablet, Take 2 mg by mouth  every 8 (eight) hours as needed for muscle spasms., Disp: , Rfl:    TOUJEO MAX SOLOSTAR 300 UNIT/ML Solostar Pen, INJECT SUBCUTANEOUSLY 110 UNITS  DAILY (Patient taking differently: Inject 70 Units into the skin at bedtime.), Disp: 36 mL, Rfl: 3   traZODone (DESYREL) 50 MG tablet, Take 0.5-1  tablets (25-50 mg total) by mouth at bedtime as needed for sleep., Disp: 30 tablet, Rfl: 3   Turmeric Curcumin 500 MG CAPS, Take 500 mg by mouth daily., Disp: , Rfl:    valsartan (DIOVAN) 40 MG tablet, Take 1 tablet (40 mg total) by mouth daily., Disp: 90 tablet, Rfl: 1 Social History   Socioeconomic History   Marital status: Married    Spouse name: Not on file   Number of children: 1   Years of education: Not on file   Highest education level: Not on file  Occupational History   Occupation: retired   Tobacco Use   Smoking status: Former    Current packs/day: 0.00    Types: Cigarettes, Pipe    Start date: 1971    Quit date: 2005    Years since quitting: 19.7   Smokeless tobacco: Never   Tobacco comments:    quit 2005 smoked cigarettes for 5 yrs prior to pipe use  Vaping Use   Vaping status: Never Used  Substance and Sexual Activity   Alcohol use: Not Currently   Drug use: No   Sexual activity: Yes  Other Topics Concern   Not on file  Social History Narrative   ** Merged History Encounter **    Right handed    Social Determinants of Health   Financial Resource Strain: Low Risk  (06/09/2022)   Overall Financial Resource Strain (CARDIA)    Difficulty of Paying Living Expenses: Not hard at all  Food Insecurity: No Food Insecurity (06/06/2022)   Hunger Vital Sign    Worried About Running Out of Food in the Last Year: Never true    Ran Out of Food in the Last Year: Never true  Transportation Needs: No Transportation Needs (06/09/2022)   PRAPARE - Administrator, Civil Service (Medical): No    Lack of Transportation (Non-Medical): No  Physical Activity: Not on file  Stress:  Not on file  Social Connections: Not on file  Intimate Partner Violence: Not At Risk (06/06/2022)   Humiliation, Afraid, Rape, and Kick questionnaire    Fear of Current or Ex-Partner: No    Emotionally Abused: No    Physically Abused: No    Sexually Abused: No   Family History  Problem Relation Age of Onset   Heart failure Mother    Heart attack Mother        in her 58's   Alzheimer's disease Mother    Diabetes Mother    Asthma Father    Suicidality Father        in his 4's   Allergic rhinitis Sister    Heart failure Maternal Grandmother    Rheum arthritis Maternal Grandmother    Breast cancer Maternal Grandmother    Heart attack Maternal Grandmother        in her 90's   Colon cancer Neg Hx    Esophageal cancer Neg Hx    Pancreatic cancer Neg Hx    Liver disease Neg Hx     Objective: Office vital signs reviewed. BP 108/63   Pulse 64   Temp 98.6 F (37 C)   Ht 5\' 8"  (1.727 m)   Wt 213 lb (96.6 kg)   SpO2 95%   BMI 32.39 kg/m   Physical Examination:  General: Awake, alert, well nourished, No acute distress HEENT: sclera white, MMM Cardio: regular rate and rhythm, S1S2 heard, no murmurs appreciated Pulm: clear to auscultation bilaterally, no wheezes, rhonchi or rales; normal work  of breathing on room air Psych: stressed when talking about his wife     05/31/2023   11:13 AM 12/26/2022   10:44 AM 08/24/2022    1:22 PM  Depression screen PHQ 2/9  Decreased Interest 1 0 0  Down, Depressed, Hopeless 1 0 1  PHQ - 2 Score 2 0 1  Altered sleeping 2 1 2   Tired, decreased energy 1 1 1   Change in appetite 1 1 1   Feeling bad or failure about yourself  2 0 0  Trouble concentrating 1 0 0  Moving slowly or fidgety/restless 2 0 0  Suicidal thoughts 1 0 0  PHQ-9 Score 12 3 5   Difficult doing work/chores  Somewhat difficult Not difficult at all      05/31/2023   11:14 AM 12/26/2022   10:44 AM 08/24/2022    2:26 PM 04/13/2022    9:35 AM  GAD 7 : Generalized Anxiety  Score  Nervous, Anxious, on Edge 1 0 1 0  Control/stop worrying 1 0 0 0  Worry too much - different things 1 0 0 0  Trouble relaxing 1 0 1 0  Restless 0 0 0 0  Easily annoyed or irritable 0 0 1 0  Afraid - awful might happen 0 0 0 0  Total GAD 7 Score 4 0 3 0  Anxiety Difficulty Somewhat difficult  Not difficult at all Not difficult at all       05/31/2023   11:15 AM  MMSE - Mini Mental State Exam  Orientation to time 5  Orientation to Place 5  Registration 3  Attention/ Calculation 5  Recall 3  Language- name 2 objects 2  Language- repeat 1  Language- follow 3 step command 3  Language- read & follow direction 1  Write a sentence 1  Copy design 1  Total score 30     Assessment/ Plan: 72 y.o. male   Controlled type 2 diabetes mellitus with stage 2 chronic kidney disease, with long-term current use of insulin (HCC)  Hypertension associated with type 2 diabetes mellitus (HCC)  Hyperlipidemia associated with type 2 diabetes mellitus (HCC)  Encounter for immunization - Plan: Flu Vaccine Trivalent High Dose (Fluad)  Marital stress   Sugar unfortunately too tightly controlled.  I am highly concerned about his recurrent hypoglycemia and I expressed that I would like him to reduce his Toujeo to 40 units daily and he will reduce his mealtime insulin by 50% so max dose should be anywhere between 12 and 15 units per meal.  I will reconvene with him in 1 week to review blood sugar log.  I asked that he write down every amount of units that he injects daily.  We will continue to adjust insulin accordingly.  I did offer referral to endocrinology but he did not want to proceed with that referral today.  His blood pressure is well-controlled  He will continue statin for cholesterol.  Influenza vaccination administered  Continues to report marital stress.  I saw no evidence of impaired cognition on today's MMSE.  He seems to have both good insight and capacity.   [name], DO [facility] [phone]

## 2023-06-03 DIAGNOSIS — Z794 Long term (current) use of insulin: Secondary | ICD-10-CM | POA: Diagnosis not present

## 2023-06-03 DIAGNOSIS — E1122 Type 2 diabetes mellitus with diabetic chronic kidney disease: Secondary | ICD-10-CM | POA: Diagnosis not present

## 2023-06-03 DIAGNOSIS — N182 Chronic kidney disease, stage 2 (mild): Secondary | ICD-10-CM | POA: Diagnosis not present

## 2023-06-08 ENCOUNTER — Encounter: Payer: Self-pay | Admitting: Family Medicine

## 2023-06-08 MED ORDER — RAMELTEON 8 MG PO TABS
8.0000 mg | ORAL_TABLET | Freq: Every day | ORAL | 3 refills | Status: DC
Start: 2023-06-08 — End: 2023-11-03

## 2023-06-08 MED ORDER — APIXABAN 5 MG PO TABS: 5.0000 mg | ORAL_TABLET | Freq: Two times a day (BID) | ORAL | 3 refills | Status: DC

## 2023-06-08 MED ORDER — FUROSEMIDE 20 MG PO TABS
30.0000 mg | ORAL_TABLET | Freq: Every day | ORAL | 3 refills | Status: DC
Start: 2023-06-08 — End: 2023-11-03

## 2023-06-08 MED ORDER — INSULIN LISPRO 100 UNIT/ML IJ SOLN
10.0000 [IU] | Freq: Three times a day (TID) | INTRAMUSCULAR | 3 refills | Status: DC
Start: 2023-06-08 — End: 2023-06-08

## 2023-06-08 MED ORDER — INSULIN LISPRO 100 UNIT/ML IJ SOLN
20.0000 [IU] | Freq: Three times a day (TID) | INTRAMUSCULAR | 3 refills | Status: DC
Start: 2023-06-08 — End: 2023-09-27

## 2023-06-08 MED ORDER — VALSARTAN 40 MG PO TABS
40.0000 mg | ORAL_TABLET | Freq: Every day | ORAL | 3 refills | Status: DC
Start: 2023-06-08 — End: 2023-11-03

## 2023-06-08 MED ORDER — ATORVASTATIN CALCIUM 40 MG PO TABS
40.0000 mg | ORAL_TABLET | Freq: Every day | ORAL | 3 refills | Status: AC
Start: 2023-06-08 — End: ?

## 2023-06-08 MED ORDER — TOUJEO MAX SOLOSTAR 300 UNIT/ML ~~LOC~~ SOPN
20.0000 [IU] | PEN_INJECTOR | Freq: Every day | SUBCUTANEOUS | 3 refills | Status: DC
Start: 2023-06-08 — End: 2023-09-27

## 2023-06-08 MED ORDER — POTASSIUM CHLORIDE CRYS ER 20 MEQ PO TBCR
20.0000 meq | EXTENDED_RELEASE_TABLET | Freq: Every day | ORAL | 3 refills | Status: DC
Start: 2023-06-08 — End: 2023-11-03

## 2023-06-08 MED ORDER — MONTELUKAST SODIUM 10 MG PO TABS
10.0000 mg | ORAL_TABLET | Freq: Every day | ORAL | 3 refills | Status: AC
Start: 2023-06-08 — End: ?

## 2023-06-08 NOTE — Addendum Note (Signed)
Addended by: [name] on: 06/08/2023 08:59 AM   Modules accepted: Orders

## 2023-06-08 NOTE — Addendum Note (Signed)
Addended by: [name] on: 06/08/2023 07:36 AM   Modules accepted: Orders

## 2023-06-14 ENCOUNTER — Other Ambulatory Visit: Payer: Self-pay | Admitting: Cardiology

## 2023-06-14 ENCOUNTER — Other Ambulatory Visit: Payer: Self-pay | Admitting: Internal Medicine

## 2023-06-19 DIAGNOSIS — E1122 Type 2 diabetes mellitus with diabetic chronic kidney disease: Secondary | ICD-10-CM | POA: Diagnosis not present

## 2023-06-19 DIAGNOSIS — Z794 Long term (current) use of insulin: Secondary | ICD-10-CM | POA: Diagnosis not present

## 2023-06-19 DIAGNOSIS — N182 Chronic kidney disease, stage 2 (mild): Secondary | ICD-10-CM | POA: Diagnosis not present

## 2023-06-23 NOTE — Progress Notes (Signed)
Moderate OSA. Needs follow up visit. If he is able to do a virtual visit, I have multiple openings on 11/8 and 11/22 PM. If not, schedule next available in person with me. Thanks.

## 2023-06-25 ENCOUNTER — Other Ambulatory Visit: Payer: Self-pay | Admitting: Cardiology

## 2023-06-28 DIAGNOSIS — E119 Type 2 diabetes mellitus without complications: Secondary | ICD-10-CM | POA: Diagnosis not present

## 2023-06-30 NOTE — Telephone Encounter (Signed)
Please just make sure we get a copy of his retinal exam please.

## 2023-07-04 ENCOUNTER — Encounter: Payer: Self-pay | Admitting: Family Medicine

## 2023-07-04 DIAGNOSIS — Z794 Long term (current) use of insulin: Secondary | ICD-10-CM | POA: Diagnosis not present

## 2023-07-04 DIAGNOSIS — E1122 Type 2 diabetes mellitus with diabetic chronic kidney disease: Secondary | ICD-10-CM | POA: Diagnosis not present

## 2023-07-04 DIAGNOSIS — N182 Chronic kidney disease, stage 2 (mild): Secondary | ICD-10-CM | POA: Diagnosis not present

## 2023-07-07 ENCOUNTER — Other Ambulatory Visit: Payer: BC Managed Care – PPO

## 2023-07-10 NOTE — Telephone Encounter (Signed)
Does pt need appt to discuss testosterone with you? I don't even see it on his med list.

## 2023-07-13 ENCOUNTER — Encounter: Payer: Self-pay | Admitting: Nurse Practitioner

## 2023-07-13 ENCOUNTER — Encounter: Payer: BC Managed Care – PPO | Admitting: Nurse Practitioner

## 2023-07-14 ENCOUNTER — Ambulatory Visit: Payer: BC Managed Care – PPO | Admitting: Physician Assistant

## 2023-07-18 ENCOUNTER — Telehealth: Payer: Self-pay | Admitting: Family Medicine

## 2023-07-18 NOTE — Telephone Encounter (Signed)
Left vm for cb

## 2023-07-18 NOTE — Telephone Encounter (Signed)
Yes, he needs OV and we need to collect levels.  I need documentation as to how/ how much he has been taking.

## 2023-07-18 NOTE — Telephone Encounter (Signed)
Copied from CRM [id number]. Topic: Appointments - Appointment Scheduling >> Jul 18, 2023  9:47 AM Maxwell Marion wrote: Tried to schedule pt's OV but he wants a call back from the nurse [facility]) and says he will get scheduled after he speaks with her

## 2023-07-21 ENCOUNTER — Encounter: Payer: Self-pay | Admitting: Family Medicine

## 2023-07-21 ENCOUNTER — Ambulatory Visit (INDEPENDENT_AMBULATORY_CARE_PROVIDER_SITE_OTHER): Payer: BC Managed Care – PPO | Admitting: Family Medicine

## 2023-07-21 VITALS — BP 132/76 | HR 95 | Temp 98.6°F | Ht 68.0 in | Wt 211.0 lb

## 2023-07-21 DIAGNOSIS — Z794 Long term (current) use of insulin: Secondary | ICD-10-CM

## 2023-07-21 DIAGNOSIS — E349 Endocrine disorder, unspecified: Secondary | ICD-10-CM | POA: Diagnosis not present

## 2023-07-21 DIAGNOSIS — E1122 Type 2 diabetes mellitus with diabetic chronic kidney disease: Secondary | ICD-10-CM

## 2023-07-21 DIAGNOSIS — N182 Chronic kidney disease, stage 2 (mild): Secondary | ICD-10-CM | POA: Diagnosis not present

## 2023-07-21 DIAGNOSIS — Z79890 Hormone replacement therapy (postmenopausal): Secondary | ICD-10-CM | POA: Diagnosis not present

## 2023-07-21 DIAGNOSIS — Z5181 Encounter for therapeutic drug level monitoring: Secondary | ICD-10-CM

## 2023-07-21 DIAGNOSIS — E162 Hypoglycemia, unspecified: Secondary | ICD-10-CM | POA: Diagnosis not present

## 2023-07-21 DIAGNOSIS — Z79899 Other long term (current) drug therapy: Secondary | ICD-10-CM | POA: Diagnosis not present

## 2023-07-21 DIAGNOSIS — Z79891 Long term (current) use of opiate analgesic: Secondary | ICD-10-CM | POA: Diagnosis not present

## 2023-07-21 MED ORDER — TESTOSTERONE 30 MG/ACT TD SOLN: TRANSDERMAL | 1 refills | Status: DC

## 2023-07-21 NOTE — Addendum Note (Signed)
Addended by: [name] on: 07/21/2023 12:00 PM   Modules accepted: Level of Service

## 2023-07-21 NOTE — Progress Notes (Addendum)
Subjective: ON:GEXBMWUXLKGM deficiency PCP: [name], DO WNU:UVOZDGU Tony Cantu is a 72 y.o. male presenting to clinic today for:  1.  Testosterone deficiency Patient with serum confirmed testosterone deficiency many years ago.  He was prescribed testosterone topically under each axilla for several years and somehow it fell off of his medication list when he was hospitalized postsurgery.  He notes that he somehow still had some leftover and has continued to use it but just ran out.  Needs refills  2.  Type II DM Patient continues to use insulin as prescribed.  He is down to about 5 units with meals.  He still has hypoglycemic episodes in the middle the night but does not wake up for these and therefore has not fingerstick checking.  He brings his libre in today for review   ROS: Per HPI  Allergies  Allergen Reactions   Dimetapp Children's Cold-Cough     Chest discomfort    Erythromycin Diarrhea   Sulfonamide Derivatives Diarrhea   Past Medical History:  Diagnosis Date   Aortic atherosclerosis (HCC)    Aortic stenosis    Arthritis    Asthma    BPH (benign prostatic hypertrophy)    Cirrhosis (HCC) 2016   Colon polyps    Diabetes mellitus without complication (HCC)    TYPE 1    Diverticulosis    Dysrhythmia 04/19/2021   A-fib noted with possible A-fib RVR   Gastric ulcer    Gastric varices    right   Gastritis    GERD (gastroesophageal reflux disease)    Headache    History- pt states these were migraines that occurred in the 1980's   Heart murmur    History of kidney stones    Hx of adenomatous colonic polyps    Hypertension    PONV (postoperative nausea and vomiting)    pt states he has never had post op nausea or vomiting   Portal hypertensive gastropathy (HCC)    Renal cyst, right    Seizures (HCC)    HYPOGLYCEMIC LAST 1 AND 1/2 YRS AGO   Sleep apnea    uses CPAP nightly   Testicle trouble    one testicle BORN WITH   Tubular adenoma of colon      Current Outpatient Medications:    acetaminophen (TYLENOL) 500 MG tablet, Take 1,000 mg by mouth every 6 (six) hours as needed for moderate pain., Disp: , Rfl:    apixaban (ELIQUIS) 5 MG TABS tablet, Take 1 tablet (5 mg total) by mouth 2 (two) times daily., Disp: 180 tablet, Rfl: 3   atorvastatin (LIPITOR) 40 MG tablet, Take 1 tablet (40 mg total) by mouth daily., Disp: 90 tablet, Rfl: 3   b complex vitamins tablet, Take 1 tablet by mouth daily., Disp: , Rfl:    Blood Glucose Monitoring Suppl (CONTOUR NEXT ONE) KIT, Test BS QID and as needed Dx E11.9, Disp: 500 kit, Rfl: 3   Cholecalciferol (DIALYVITE VITAMIN D 5000) 125 MCG (5000 UT) capsule, Take 5,000 Units by mouth daily., Disp: , Rfl:    Chromium Picolinate (CHROMIUM PICOLATE PO), Take 1 tablet by mouth daily., Disp: , Rfl:    CINNAMON PO, Take 1,080 mg by mouth daily., Disp: , Rfl:    Coenzyme Q10 (COQ10) 100 MG CAPS, Take 100 mg by mouth daily., Disp: , Rfl:    Continuous Blood Gluc Sensor (FREESTYLE LIBRE SENSOR SYSTEM) MISC, Check BS eight (8) times a day. Dx E10.9, Disp: 7 each, Rfl: 3  diltiazem (CARDIZEM) 30 MG tablet, Take 1 tablet (30 mg total) by mouth 2 (two) times daily. Patient must schedule annual office visit for further refills, Disp: 15 tablet, Rfl: 0   ferrous sulfate 325 (65 FE) MG EC tablet, Take 1 tablet (325 mg total) by mouth daily with breakfast., Disp: 90 tablet, Rfl: 3   furosemide (LASIX) 20 MG tablet, Take 1.5 tablets (30 mg total) by mouth daily., Disp: 135 tablet, Rfl: 3   Glucagon, rDNA, (GLUCAGON EMERGENCY) 1 MG KIT, INJECT AS DIRECTED INTO  UPPER ARM, THIGH OR  BUTTOCKS AS NEEDED FOR  SEVERE HYPOGLYCEMIA. SEEK  MEDICAL ATTENTION AFTER USE (Patient taking differently: Inject 1 mg into the muscle as needed (HYPOGLYCEMIA).), Disp: 3 kit, Rfl: 0   Glucosamine-Chondroit-Vit C-Mn (GLUCOSAMINE 1500 COMPLEX) CAPS, Take 2 capsules by mouth daily., Disp: , Rfl:    glucose blood (CONTOUR NEXT TEST) test strip, Test  BS QID and as needed Dx E11.9, Disp: 400 strip, Rfl: 3   insulin glargine, 2 Unit Dial, (TOUJEO MAX SOLOSTAR) 300 UNIT/ML Solostar Pen, Inject 20-50 Units into the skin daily., Disp: 15 mL, Rfl: 3   insulin lispro (HUMALOG) 100 UNIT/ML injection, Inject 0.2-0.3 mLs (20-30 Units total) into the skin 3 (three) times daily with meals. And per sliding scale.  ** dose change, Disp: 80 mL, Rfl: 3   Magnesium 250 MG TABS, Take 250 mg by mouth daily., Disp: , Rfl:    METAMUCIL FIBER PO, Take 1 capsule by mouth 2 (two) times daily., Disp: , Rfl:    Microlet Lancets MISC, Test BS QID and as needed Dx E11.9, Disp: 500 each, Rfl: 3   montelukast (SINGULAIR) 10 MG tablet, Take 1 tablet (10 mg total) by mouth at bedtime., Disp: 90 tablet, Rfl: 3   Multiple Vitamins-Minerals (EYE HEALTH) CAPS, Take 1 capsule by mouth daily., Disp: , Rfl:    oxyCODONE (OXY IR/ROXICODONE) 5 MG immediate release tablet, Take 5 mg by mouth 2 (two) times daily as needed., Disp: , Rfl:    pantoprazole (PROTONIX) 40 MG tablet, TAKE 1 TABLET BY MOUTH TWICE  DAILY BEFORE MEALS, Disp: 180 tablet, Rfl: 0   potassium chloride SA (KLOR-CON M) 20 MEQ tablet, Take 1 tablet (20 mEq total) by mouth daily., Disp: 90 tablet, Rfl: 3   ramelteon (ROZEREM) 8 MG tablet, Take 1 tablet (8 mg total) by mouth at bedtime., Disp: 90 tablet, Rfl: 3   tiZANidine (ZANAFLEX) 2 MG tablet, Take 2 mg by mouth every 8 (eight) hours as needed for muscle spasms., Disp: , Rfl:    Turmeric Curcumin 500 MG CAPS, Take 500 mg by mouth daily., Disp: , Rfl:    valsartan (DIOVAN) 40 MG tablet, Take 1 tablet (40 mg total) by mouth daily., Disp: 90 tablet, Rfl: 3 Social History   Socioeconomic History   Marital status: Married    Spouse name: Not on file   Number of children: 1   Years of education: Not on file   Highest education level: Not on file  Occupational History   Occupation: retired   Tobacco Use   Smoking status: Former    Current packs/day: 0.00    Types:  Cigarettes, Pipe    Start date: 1971    Quit date: 2005    Years since quitting: 19.9   Smokeless tobacco: Never   Tobacco comments:    quit 2005 smoked cigarettes for 5 yrs prior to pipe use  Vaping Use   Vaping status: Never Used  Substance  and Sexual Activity   Alcohol use: Not Currently   Drug use: No   Sexual activity: Yes  Other Topics Concern   Not on file  Social History Narrative   ** Merged History Encounter **    Right handed    Social Determinants of Health   Financial Resource Strain: Low Risk  (06/09/2022)   Overall Financial Resource Strain (CARDIA)    Difficulty of Paying Living Expenses: Not hard at all  Food Insecurity: No Food Insecurity (06/06/2022)   Hunger Vital Sign    Worried About Running Out of Food in the Last Year: Never true    Ran Out of Food in the Last Year: Never true  Transportation Needs: No Transportation Needs (06/09/2022)   PRAPARE - Administrator, Civil Service (Medical): No    Lack of Transportation (Non-Medical): No  Physical Activity: Not on file  Stress: Not on file  Social Connections: Not on file  Intimate Partner Violence: Not At Risk (06/06/2022)   Humiliation, Afraid, Rape, and Kick questionnaire    Fear of Current or Ex-Partner: No    Emotionally Abused: No    Physically Abused: No    Sexually Abused: No   Family History  Problem Relation Age of Onset   Heart failure Mother    Heart attack Mother        in her 90's   Alzheimer's disease Mother    Diabetes Mother    Asthma Father    Suicidality Father        in his 57's   Allergic rhinitis Sister    Heart failure Maternal Grandmother    Rheum arthritis Maternal Grandmother    Breast cancer Maternal Grandmother    Heart attack Maternal Grandmother        in her 29's   Colon cancer Neg Hx    Esophageal cancer Neg Hx    Pancreatic cancer Neg Hx    Liver disease Neg Hx     Objective: Office vital signs reviewed. BP 132/76   Pulse 95   Temp 98.6  F (37 C)   Ht 5\' 8"  (1.727 m)   Wt 211 lb (95.7 kg)   SpO2 97%   BMI 32.08 kg/m   Physical Examination:  General: Awake, alert, well nourished, No acute distress HEENT: sclera white MMM Cardio: regular rate Pulm:   normal work of breathing on room air Chest: no gynecomastia.    Assessment/ Plan: 72 y.o. male   Testosterone deficiency - Plan: Testosterone, PSA, ToxASSURE Select 13 (MW), Urine, PSA, Testosterone, Testosterone 30 MG/ACT SOLN  Controlled substance agreement signed - Plan: Testosterone, PSA, ToxASSURE Select 13 (MW), Urine, PSA, Testosterone  Encounter for monitoring testosterone replacement therapy - Plan: Testosterone, PSA, ToxASSURE Select 13 (MW), Urine, PSA, Testosterone  Controlled type 2 diabetes mellitus with stage 2 chronic kidney disease, with long-term current use of insulin (HCC)  Hypoglycemia  UDS CSC, PSA, testosterone level collected today.  National narcotic database reviewed and there were no red flags.  I have renewed his medication and will follow-up with him in 6 months, sooner if concerns arise  He was able to meet with our clinical pharmacist Raynelle Fanning.  She downloaded his Josephine Igo results and I reviewed CGM.  Libre 14d system.  Several hypoglycemic episodes overnight (~20), ?compressability.  Time in target range 64%. Avg GMI 6, 25% lows.  She will see him back in 1 week.  We have made some adjustments to the insulins in efforts to  reduce the frequency of hypoglycemia.   [name], DO [facility] [phone]

## 2023-07-22 LAB — PSA: Prostate Specific Ag, Serum: 0.9 ng/mL (ref 0.0–4.0)

## 2023-07-22 LAB — TESTOSTERONE: Testosterone: 750 ng/dL (ref 264–916)

## 2023-07-24 ENCOUNTER — Encounter: Payer: Self-pay | Admitting: Family Medicine

## 2023-07-25 LAB — TOXASSURE SELECT 13 (MW), URINE

## 2023-08-03 DIAGNOSIS — N182 Chronic kidney disease, stage 2 (mild): Secondary | ICD-10-CM | POA: Diagnosis not present

## 2023-08-03 DIAGNOSIS — Z794 Long term (current) use of insulin: Secondary | ICD-10-CM | POA: Diagnosis not present

## 2023-08-03 DIAGNOSIS — E1122 Type 2 diabetes mellitus with diabetic chronic kidney disease: Secondary | ICD-10-CM | POA: Diagnosis not present

## 2023-08-04 ENCOUNTER — Encounter: Payer: Self-pay | Admitting: Family Medicine

## 2023-08-09 ENCOUNTER — Other Ambulatory Visit (HOSPITAL_COMMUNITY): Payer: Self-pay

## 2023-08-09 ENCOUNTER — Telehealth: Payer: Self-pay

## 2023-08-09 ENCOUNTER — Inpatient Hospital Stay: Payer: BC Managed Care – PPO | Attending: Physician Assistant | Admitting: Physician Assistant

## 2023-08-09 DIAGNOSIS — D696 Thrombocytopenia, unspecified: Secondary | ICD-10-CM | POA: Diagnosis not present

## 2023-08-09 DIAGNOSIS — Z7901 Long term (current) use of anticoagulants: Secondary | ICD-10-CM | POA: Diagnosis not present

## 2023-08-09 DIAGNOSIS — R768 Other specified abnormal immunological findings in serum: Secondary | ICD-10-CM | POA: Insufficient documentation

## 2023-08-09 DIAGNOSIS — Z860101 Personal history of adenomatous and serrated colon polyps: Secondary | ICD-10-CM | POA: Diagnosis not present

## 2023-08-09 DIAGNOSIS — D500 Iron deficiency anemia secondary to blood loss (chronic): Secondary | ICD-10-CM

## 2023-08-09 DIAGNOSIS — E669 Obesity, unspecified: Secondary | ICD-10-CM | POA: Insufficient documentation

## 2023-08-09 DIAGNOSIS — Z79890 Hormone replacement therapy (postmenopausal): Secondary | ICD-10-CM | POA: Insufficient documentation

## 2023-08-09 DIAGNOSIS — D509 Iron deficiency anemia, unspecified: Secondary | ICD-10-CM | POA: Insufficient documentation

## 2023-08-09 DIAGNOSIS — Z833 Family history of diabetes mellitus: Secondary | ICD-10-CM | POA: Insufficient documentation

## 2023-08-09 DIAGNOSIS — E119 Type 2 diabetes mellitus without complications: Secondary | ICD-10-CM | POA: Insufficient documentation

## 2023-08-09 DIAGNOSIS — Z79899 Other long term (current) drug therapy: Secondary | ICD-10-CM | POA: Insufficient documentation

## 2023-08-09 DIAGNOSIS — I480 Paroxysmal atrial fibrillation: Secondary | ICD-10-CM | POA: Insufficient documentation

## 2023-08-09 DIAGNOSIS — G479 Sleep disorder, unspecified: Secondary | ICD-10-CM | POA: Diagnosis not present

## 2023-08-09 DIAGNOSIS — I350 Nonrheumatic aortic (valve) stenosis: Secondary | ICD-10-CM | POA: Insufficient documentation

## 2023-08-09 DIAGNOSIS — Z882 Allergy status to sulfonamides status: Secondary | ICD-10-CM | POA: Insufficient documentation

## 2023-08-09 DIAGNOSIS — G4730 Sleep apnea, unspecified: Secondary | ICD-10-CM | POA: Insufficient documentation

## 2023-08-09 DIAGNOSIS — D7589 Other specified diseases of blood and blood-forming organs: Secondary | ICD-10-CM | POA: Diagnosis not present

## 2023-08-09 DIAGNOSIS — M2550 Pain in unspecified joint: Secondary | ICD-10-CM | POA: Diagnosis not present

## 2023-08-09 DIAGNOSIS — I10 Essential (primary) hypertension: Secondary | ICD-10-CM | POA: Diagnosis not present

## 2023-08-09 DIAGNOSIS — K3189 Other diseases of stomach and duodenum: Secondary | ICD-10-CM | POA: Diagnosis not present

## 2023-08-09 DIAGNOSIS — Z8719 Personal history of other diseases of the digestive system: Secondary | ICD-10-CM | POA: Insufficient documentation

## 2023-08-09 DIAGNOSIS — R351 Nocturia: Secondary | ICD-10-CM | POA: Insufficient documentation

## 2023-08-09 DIAGNOSIS — Z8261 Family history of arthritis: Secondary | ICD-10-CM | POA: Insufficient documentation

## 2023-08-09 DIAGNOSIS — Z8249 Family history of ischemic heart disease and other diseases of the circulatory system: Secondary | ICD-10-CM | POA: Insufficient documentation

## 2023-08-09 DIAGNOSIS — Z87891 Personal history of nicotine dependence: Secondary | ICD-10-CM | POA: Insufficient documentation

## 2023-08-09 DIAGNOSIS — R5383 Other fatigue: Secondary | ICD-10-CM | POA: Diagnosis not present

## 2023-08-09 DIAGNOSIS — Z825 Family history of asthma and other chronic lower respiratory diseases: Secondary | ICD-10-CM | POA: Insufficient documentation

## 2023-08-09 DIAGNOSIS — D649 Anemia, unspecified: Secondary | ICD-10-CM

## 2023-08-09 DIAGNOSIS — K648 Other hemorrhoids: Secondary | ICD-10-CM | POA: Insufficient documentation

## 2023-08-09 DIAGNOSIS — F32A Depression, unspecified: Secondary | ICD-10-CM | POA: Insufficient documentation

## 2023-08-09 DIAGNOSIS — Z9089 Acquired absence of other organs: Secondary | ICD-10-CM | POA: Insufficient documentation

## 2023-08-09 DIAGNOSIS — K31811 Angiodysplasia of stomach and duodenum with bleeding: Secondary | ICD-10-CM | POA: Diagnosis not present

## 2023-08-09 DIAGNOSIS — Z803 Family history of malignant neoplasm of breast: Secondary | ICD-10-CM | POA: Insufficient documentation

## 2023-08-09 DIAGNOSIS — K5730 Diverticulosis of large intestine without perforation or abscess without bleeding: Secondary | ICD-10-CM | POA: Diagnosis not present

## 2023-08-09 DIAGNOSIS — Z87442 Personal history of urinary calculi: Secondary | ICD-10-CM | POA: Insufficient documentation

## 2023-08-09 DIAGNOSIS — Z881 Allergy status to other antibiotic agents status: Secondary | ICD-10-CM | POA: Insufficient documentation

## 2023-08-09 LAB — CBC WITH DIFFERENTIAL/PLATELET
Abs Immature Granulocytes: 0.01 10*3/uL (ref 0.00–0.07)
Basophils Absolute: 0.1 10*3/uL (ref 0.0–0.1)
Basophils Relative: 1 %
Eosinophils Absolute: 0.1 10*3/uL (ref 0.0–0.5)
Eosinophils Relative: 2 %
HCT: 37.6 % — ABNORMAL LOW (ref 39.0–52.0)
Hemoglobin: 12.2 g/dL — ABNORMAL LOW (ref 13.0–17.0)
Immature Granulocytes: 0 %
Lymphocytes Relative: 25 %
Lymphs Abs: 1.2 10*3/uL (ref 0.7–4.0)
MCH: 34.8 pg — ABNORMAL HIGH (ref 26.0–34.0)
MCHC: 32.4 g/dL (ref 30.0–36.0)
MCV: 107.1 fL — ABNORMAL HIGH (ref 80.0–100.0)
Monocytes Absolute: 0.8 10*3/uL (ref 0.1–1.0)
Monocytes Relative: 16 %
Neutro Abs: 2.7 10*3/uL (ref 1.7–7.7)
Neutrophils Relative %: 56 %
Platelets: 125 10*3/uL — ABNORMAL LOW (ref 150–400)
RBC: 3.51 MIL/uL — ABNORMAL LOW (ref 4.22–5.81)
RDW: 15.1 % (ref 11.5–15.5)
WBC: 4.7 10*3/uL (ref 4.0–10.5)
nRBC: 0 % (ref 0.0–0.2)

## 2023-08-09 LAB — COMPREHENSIVE METABOLIC PANEL
ALT: 33 U/L (ref 0–44)
AST: 37 U/L (ref 15–41)
Albumin: 3.1 g/dL — ABNORMAL LOW (ref 3.5–5.0)
Alkaline Phosphatase: 76 U/L (ref 38–126)
Anion gap: 9 (ref 5–15)
BUN: 13 mg/dL (ref 8–23)
CO2: 22 mmol/L (ref 22–32)
Calcium: 9.1 mg/dL (ref 8.9–10.3)
Chloride: 108 mmol/L (ref 98–111)
Creatinine, Ser: 0.97 mg/dL (ref 0.61–1.24)
GFR, Estimated: >60 mL/min (ref >60)
Glucose, Bld: 130 mg/dL — ABNORMAL HIGH (ref 70–99)
Potassium: 3 mmol/L — ABNORMAL LOW (ref 3.5–5.1)
Sodium: 139 mmol/L (ref 135–145)
Total Bilirubin: 0.8 mg/dL (ref <1.2)
Total Protein: 6.6 g/dL (ref 6.5–8.1)

## 2023-08-09 LAB — IRON AND TIBC
Iron: 157 ug/dL (ref 45–182)
Saturation Ratios: 45 % — ABNORMAL HIGH (ref 17.9–39.5)
TIBC: 351 ug/dL (ref 250–450)
UIBC: 194 ug/dL

## 2023-08-09 LAB — FERRITIN: Ferritin: 17 ng/mL — ABNORMAL LOW (ref 24–336)

## 2023-08-09 LAB — LACTATE DEHYDROGENASE: LDH: 168 U/L (ref 98–192)

## 2023-08-09 NOTE — Telephone Encounter (Signed)
Pharmacy Patient Advocate Encounter   Received notification from Patient Advice Request messages that prior authorization for Testosterone 30MG /ACT solution is required/requested.   Insurance verification completed.   The patient is insured through [facility] .   Per test claim: PA required; PA submitted to above mentioned insurance via CoverMyMeds Key/confirmation #/EOC [id number] Status is pending

## 2023-08-10 ENCOUNTER — Other Ambulatory Visit (HOSPITAL_COMMUNITY): Payer: Self-pay

## 2023-08-10 LAB — KAPPA/LAMBDA LIGHT CHAINS
Kappa free light chain: 108.8 mg/L — ABNORMAL HIGH (ref 3.3–19.4)
Kappa, lambda light chain ratio: 3 — ABNORMAL HIGH (ref 0.26–1.65)
Lambda free light chains: 36.3 mg/L — ABNORMAL HIGH (ref 5.7–26.3)

## 2023-08-10 NOTE — Telephone Encounter (Signed)
Pharmacy Patient Advocate Encounter  Received notification from [facility] that Prior Authorization for Testosterone 30MG /ACT solution  has been APPROVED from 08/09/23 to 08/08/24. Ran test claim, Copay is $10. This test claim was processed through [facility]- copay amounts may vary at other pharmacies due to pharmacy/plan contracts, or as the patient moves through the different stages of their insurance plan.   PA #/Case ID/Reference #: [id number]

## 2023-08-10 NOTE — Progress Notes (Signed)
Per Durenda Hurt, NP patient will take 40 meq KCL daily for 5 days and resume previous dose of 20 meq daily.  Patient aware and verbalized understanding.

## 2023-08-15 LAB — PROTEIN ELECTROPHORESIS, SERUM
A/G Ratio: 0.9 (ref 0.7–1.7)
Albumin ELP: 3 g/dL (ref 2.9–4.4)
Alpha-1-Globulin: 0.2 g/dL (ref 0.0–0.4)
Alpha-2-Globulin: 0.5 g/dL (ref 0.4–1.0)
Beta Globulin: 1.2 g/dL (ref 0.7–1.3)
Gamma Globulin: 1.3 g/dL (ref 0.4–1.8)
Globulin, Total: 3.2 g/dL (ref 2.2–3.9)
Total Protein ELP: 6.2 g/dL (ref 6.0–8.5)

## 2023-08-15 NOTE — Progress Notes (Unsigned)
[facility] [address] [place], [place] [address]   CLINIC:  Medical Oncology/Hematology  PCP:  [name], DO [address] [place] [address] [phone]   REASON FOR VISIT:  Follow-up for thrombocytopenia and iron deficiency anemia  CURRENT THERAPY: Surveillance  INTERVAL HISTORY:   Tony Cantu 72 y.o. male returns for routine follow-up of thrombocytopenia and iron deficiency anemia.  At today's visit, he reports feeling fair.  No recent hospitalizations, surgeries, or changes in baseline health status.  He remains very focussed on his relational struggles in his home life.  He bruises easily.  Patient denies any rectal bleeding, but notes occasional "dark stools."  He reports ongoing fatigue, but slightly improved after starting iron tablet in May 2024.  He denies any pica, lightheadedness, syncope, headaches, chest pain, or dyspnea on exertion.  He denies any unintentional weight loss or B symptoms.  He has 40% energy and 65% appetite. He endorses that he is maintaining a stable weight.  ASSESSMENT & PLAN:  1.  Mild thrombocytopenia, likely secondary to cirrhosis -  Seen at the request of Dr. Nadine Counts for mild thrombocytopenia since September 2020. - No history of bleeding.  Mild bruising.  Denies any B symptoms. - Patient started on Eliquis for paroxysmal atrial fibrillation since mid January 2023. - No prior history of blood transfusions. - No history of RA, lupus, or ulcerative colitis/Crohn's disease. - CT abdomen on 04/28/2021 with cirrhosis.  Spleen size normal. - His previous lab reports indicate possible clumping of platelets contributing to mild thrombocytopenia. - Hematology work-up (12/10/2021): Pathology smear review confirms mild thrombocytopenia. Negative hepatitis B and C.  Negative H. pylori. Normal rheumatoid factor, ANA, LDH. No nutritional deficiencies.  Normal B12, folate, methylmalonic acid, copper. SPEP and reticulocytes  within normal limits.  - Most recent labs (08/09/2023): Platelets 125 (at baseline) - We discussed differential diagnosis for thrombocytopenia.  Etiology favors liver disease, but could also be immune mediated. - PLAN: No indication for treatment at this time. - We will continue to monitor platelets with labs every 6 to 12 months. - If any significant changes in baseline blood counts, would consider bone marrow biopsy. - If platelets less than 30, would consider treatment, including trial of steroids to see if there is any improvement.   2.  Macrocytic anemia - Mild anemia noted with CBC/D from 01/04/2023 revealing Hgb 10.7.  Macrocytosis likely from liver cirrhosis. 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  Colonoscopy revealed multiple polyps, diverticulosis, internal hemorrhoids. - He takes Eliquis - Hematology workup (01/09/2023): Ferritin 37, iron saturation 14% Creatinine 1.14/GFR 68 Normal folate, B12, MMA, copper Immunofixation with polyclonal increase in immunoglobulins (addressed below)  LDH elevated 259.  Bilirubin normal 0.8.  Reticulocytes 2.2%. - Most recent labs (08/09/2023): Hgb 12.2/MCV 107.1.  Ferritin 17, iron saturation 45%. GFR >60 as evidenced by creatinine 1.17 with normal Cystatin C 1.09 - Patient denies any rectal bleeding, but does have some dark stools from time to time - He reports fatigue, which is improved after taking iron tablets - PLAN: Persistent iron deficiency anemia despite daily ferrous sulfate - recommend IV Venofer 300 mg x 3.  (Discussed potential risks and side effects, including risk of reaction, and patient is agreeable to proceed.)  - Continue ferrous sulfate 325 mg daily with stool softener.   - Labs + OFFICE visit in 3 months  3.  Elevated serum free light chains - Workup of anemia (01/09/2023)  revealed the following: Immunofixation with polyclonal increase in immunoglobulins Elevated kappa free light chain 114.5, elevated lambda free light chain 36.0, elevated FLC ratio 3.18. SPEP negative for M spike. - 24-hour urine/UPEP (05/23/2023): Negative for M spike.  Immunofixation unremarkable.  No evidence of monoclonal gammopathy or Bence-Jones proteinuria. 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 - We will recheck MGUS/myeloma panel annually (next due December 2025)  4. Other concerns - Home safety topics were addressed at today's visit (08/16/23) - Patient reports he has also discussed with PCP - He declined social work referral at this time - PLAN: I have reached out to PCP to inform her of patient's concerns.  Patient is aware that he can contact either of our offices if he decides he would like social work referral in the future.   5.  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  No chemicals exposure.  Quit smoking pipe in 2005.  He smoked pipe for 35 years. - No family history of thrombocytopenia.  Maternal grandmother had breast cancer.  Mother could have had intestinal cancer when she had part of intestine due to resected.  PLAN SUMMARY: >> IV iron with Venofer 300 mg x 3 >> Labs in 3 months = CBC/D, ferritin, iron/TIBC, CMP >> OFFICE visit in 3 months (1 week after labs)     REVIEW OF SYSTEMS:   Review of Systems  Constitutional:  Positive for fatigue. Negative for appetite change, chills, diaphoresis, fever and unexpected weight change.  HENT:   Negative for lump/mass and nosebleeds.   Eyes:  Negative for eye problems.  Respiratory:  Positive for cough.  Negative for hemoptysis and shortness of breath.   Cardiovascular:  Negative for chest pain, leg swelling and palpitations.  Gastrointestinal:  Negative for abdominal pain, blood in stool, constipation, diarrhea, nausea and vomiting.  Genitourinary:  Positive for nocturia. Negative for hematuria.   Musculoskeletal:  Positive for arthralgias.  Skin: Negative.   Neurological:  Negative for dizziness, headaches and light-headedness.  Hematological:  Does not bruise/bleed easily.  Psychiatric/Behavioral:  Positive for depression and sleep disturbance. The patient is nervous/anxious.      PHYSICAL EXAM:  ECOG PERFORMANCE STATUS: 1 - Symptomatic but completely ambulatory  Vitals:   08/16/23 0847  BP: 119/81  Pulse: 64  Resp: 18  Temp: 98.4 F (36.9 C)  SpO2: 98%    Filed Weights   08/16/23 0847  Weight: 215 lb 3.2 oz (97.6 kg)    Physical Exam Constitutional:      Appearance: Normal appearance. He is obese.  Cardiovascular:     Heart sounds: Murmur heard.  Pulmonary:     Breath sounds: Normal breath sounds.  Neurological:     General: No focal deficit present.     Mental Status: Mental status is at baseline.  Psychiatric:        Behavior: Behavior normal. Behavior is cooperative.     PAST MEDICAL/SURGICAL HISTORY:  Past Medical History:  Diagnosis Date   Aortic atherosclerosis (HCC)    Aortic stenosis    Arthritis    Asthma    BPH (benign prostatic hypertrophy)    Cirrhosis (HCC) 2016  Colon polyps    Diabetes mellitus without complication (HCC)    TYPE 1    Diverticulosis    Dysrhythmia 04/19/2021   A-fib noted with possible A-fib RVR   Gastric ulcer    Gastric varices    right   Gastritis    GERD (gastroesophageal reflux disease)    Headache    History- pt states these were migraines that occurred in the 1980's   Heart murmur    History of kidney stones    Hx of adenomatous colonic polyps    Hypertension    PONV (postoperative nausea and vomiting)     pt states he has never had post op nausea or vomiting   Portal hypertensive gastropathy (HCC)    Renal cyst, right    Seizures (HCC)    HYPOGLYCEMIC LAST 1 AND 1/2 YRS AGO   Sleep apnea    uses CPAP nightly   Testicle trouble    one testicle BORN WITH   Tubular adenoma of colon    Past Surgical History:  Procedure Laterality Date   BACK SURGERY     94  LOWER    CARPAL TUNNEL RELEASE Right 10/13/2015   Procedure: RIGHT CARPAL TUNNEL RELEASE;  Surgeon: Cindee Salt, MD;  Location: [facility];  Service: Orthopedics;  Laterality: Right;   CARPAL TUNNEL RELEASE Left 07/05/2016   Procedure: LEFT CARPAL TUNNEL RELEASE;  Surgeon: Cindee Salt, MD;  Location: [facility];  Service: Orthopedics;  Laterality: Left;   COLONOSCOPY WITH ESOPHAGOGASTRODUODENOSCOPY (EGD)  10/05/2022   DRUG INDUCED ENDOSCOPY N/A 07/02/2021   Procedure: DRUG INDUCED SLEEP ENDOSCOPY;  Surgeon: Christia Reading, MD;  Location: [facility] OR;  Service: ENT;  Laterality: N/A;   IMPLANTATION OF HYPOGLOSSAL NERVE STIMULATOR Right 10/01/2021   Procedure: IMPLANTATION OF HYPOGLOSSAL NERVE STIMULATOR;  Surgeon: Christia Reading, MD;  Location[facility] OR;  Service: ENT;  Laterality: Right;   INGUINAL HERNIA REPAIR  2003   right    KNEE ARTHROSCOPY Right 03/03/2016   LUMBAR DISC SURGERY  03/1995   Dr. Daleen Squibb, discectomy   LUMBAR LAMINECTOMY/DECOMPRESSION MICRODISCECTOMY Right 10/06/2016   Procedure: RIGHT LUMBAR THREE - LUMBAR FOUR  LAMINECTOMY, FORAMINOTOMY AND MICRODISCECTOMY;  Surgeon: Shirlean Kelly, MD;  Location: [facility] OR;  Service: Neurosurgery;  Laterality: Right;   SHOULDER SURGERY  11/28/2005   left partial   SHOULDER SURGERY  07/14/2006   RIGHT   TONSILLECTOMY  AGE 20 OR 4   TOTAL KNEE ARTHROPLASTY Right 03/07/2022   Procedure: RIGHT TOTAL KNEE ARTHROPLASTY;  Surgeon: Gean Birchwood, MD;  Location: [facility];  Service: Orthopedics;  Laterality: Right;   TOTAL KNEE ARTHROPLASTY Left 06/06/2022   Procedure:  LEFT TOTAL KNEE ARTHROPLASTY;  Surgeon: Gean Birchwood, MD;  Location: [facility];  Service: Orthopedics;  Laterality: Left;   TOTAL SHOULDER ARTHROPLASTY Right 11/01/2018   Procedure: RIGHT SHOULDER REVISION TO REVERSE TOTAL SHOULDER;  Surgeon: Jones Broom, MD;  Location: [facility];  Service: Orthopedics;  Laterality: Right;  CHOICE ANESTHESIA WITH INTERSCALENE BLOCK EXPAREL, NEEDS RNFA    SOCIAL HISTORY:  Social History   Socioeconomic History   Marital status: Married    Spouse name: Not on file   Number of children: 1   Years of education: Not on file   Highest education level: Not on file  Occupational History   Occupation: retired   Tobacco Use   Smoking status: Former    Current packs/day: 0.00    Types: Cigarettes, Pipe    Start date: 1971  Quit date: 2005    Years since quitting: 19.9   Smokeless tobacco: Never   Tobacco comments:    quit 2005 smoked cigarettes for 5 yrs prior to pipe use  Vaping Use   Vaping status: Never Used  Substance and Sexual Activity   Alcohol use: Not Currently   Drug use: No   Sexual activity: Yes  Other Topics Concern   Not on file  Social History Narrative   ** Merged History Encounter **    Right handed    Social Drivers of Health   Financial Resource Strain: Low Risk  (06/09/2022)   Overall Financial Resource Strain (CARDIA)    Difficulty of Paying Living Expenses: Not hard at all  Food Insecurity: No Food Insecurity (06/06/2022)   Hunger Vital Sign    Worried About Running Out of Food in the Last Year: Never true    Ran Out of Food in the Last Year: Never true  Transportation Needs: No Transportation Needs (06/09/2022)   PRAPARE - Administrator, Civil Service (Medical): No    Lack of Transportation (Non-Medical): No  Physical Activity: Not on file  Stress: Not on file  Social Connections: Not on file  Intimate Partner Violence: Not At Risk (06/06/2022)   Humiliation, Afraid, Rape, and Kick questionnaire    Fear  of Current or Ex-Partner: No    Emotionally Abused: No    Physically Abused: No    Sexually Abused: No    FAMILY HISTORY:  Family History  Problem Relation Age of Onset   Heart failure Mother    Heart attack Mother        in her 51's   Alzheimer's disease Mother    Diabetes Mother    Asthma Father    Suicidality Father        in his 2's   Allergic rhinitis Sister    Heart failure Maternal Grandmother    Rheum arthritis Maternal Grandmother    Breast cancer Maternal Grandmother    Heart attack Maternal Grandmother        in her 53's   Colon cancer Neg Hx    Esophageal cancer Neg Hx    Pancreatic cancer Neg Hx    Liver disease Neg Hx     CURRENT MEDICATIONS:  Outpatient Encounter Medications as of 08/16/2023  Medication Sig   acetaminophen (TYLENOL) 500 MG tablet Take 1,000 mg by mouth every 6 (six) hours as needed for moderate pain.   apixaban (ELIQUIS) 5 MG TABS tablet Take 1 tablet (5 mg total) by mouth 2 (two) times daily.   atorvastatin (LIPITOR) 40 MG tablet Take 1 tablet (40 mg total) by mouth daily.   b complex vitamins tablet Take 1 tablet by mouth daily.   Blood Glucose Monitoring Suppl (CONTOUR NEXT ONE) KIT Test BS QID and as needed Dx E11.9   Cholecalciferol (DIALYVITE VITAMIN D 5000) 125 MCG (5000 UT) capsule Take 5,000 Units by mouth daily.   Chromium Picolinate (CHROMIUM PICOLATE PO) Take 1 tablet by mouth daily.   CINNAMON PO Take 1,080 mg by mouth daily.   Coenzyme Q10 (COQ10) 100 MG CAPS Take 100 mg by mouth daily.   Continuous Blood Gluc Sensor (FREESTYLE LIBRE SENSOR SYSTEM) MISC Check BS eight (8) times a day. Dx E10.9   diltiazem (CARDIZEM) 30 MG tablet Take 1 tablet (30 mg total) by mouth 2 (two) times daily. Patient must schedule annual office visit for further refills   ferrous sulfate 325 (65 FE)  MG EC tablet Take 1 tablet (325 mg total) by mouth daily with breakfast.   furosemide (LASIX) 20 MG tablet Take 1.5 tablets (30 mg total) by mouth daily.    Glucagon, rDNA, (GLUCAGON EMERGENCY) 1 MG KIT INJECT AS DIRECTED INTO  UPPER ARM, THIGH OR  BUTTOCKS AS NEEDED FOR  SEVERE HYPOGLYCEMIA. SEEK  MEDICAL ATTENTION AFTER USE (Patient taking differently: Inject 1 mg into the muscle as needed (HYPOGLYCEMIA).)   Glucosamine-Chondroit-Vit C-Mn (GLUCOSAMINE 1500 COMPLEX) CAPS Take 2 capsules by mouth daily.   glucose blood (CONTOUR NEXT TEST) test strip Test BS QID and as needed Dx E11.9   insulin glargine, 2 Unit Dial, (TOUJEO MAX SOLOSTAR) 300 UNIT/ML Solostar Pen Inject 20-50 Units into the skin daily.   insulin lispro (HUMALOG) 100 UNIT/ML injection Inject 0.2-0.3 mLs (20-30 Units total) into the skin 3 (three) times daily with meals. And per sliding scale.  ** dose change   Magnesium 250 MG TABS Take 250 mg by mouth daily.   METAMUCIL FIBER PO Take 1 capsule by mouth 2 (two) times daily.   Microlet Lancets MISC Test BS QID and as needed Dx E11.9   montelukast (SINGULAIR) 10 MG tablet Take 1 tablet (10 mg total) by mouth at bedtime.   Multiple Vitamins-Minerals (EYE HEALTH) CAPS Take 1 capsule by mouth daily.   oxyCODONE (OXY IR/ROXICODONE) 5 MG immediate release tablet Take 5 mg by mouth 2 (two) times daily as needed.   pantoprazole (PROTONIX) 40 MG tablet TAKE 1 TABLET BY MOUTH TWICE  DAILY BEFORE MEALS   potassium chloride SA (KLOR-CON M) 20 MEQ tablet Take 1 tablet (20 mEq total) by mouth daily.   ramelteon (ROZEREM) 8 MG tablet Take 1 tablet (8 mg total) by mouth at bedtime.   Testosterone 30 MG/ACT SOLN Apply 1 pump under each axilla once daily.   tiZANidine (ZANAFLEX) 2 MG tablet Take 2 mg by mouth every 8 (eight) hours as needed for muscle spasms.   Turmeric Curcumin 500 MG CAPS Take 500 mg by mouth daily.   valsartan (DIOVAN) 40 MG tablet Take 1 tablet (40 mg total) by mouth daily.   No facility-administered encounter medications on file as of 08/16/2023.    ALLERGIES:  Allergies  Allergen Reactions   Dimetapp Children's Cold-Cough      Chest discomfort    Erythromycin Diarrhea   Sulfonamide Derivatives Diarrhea    LABORATORY DATA:  I have reviewed the labs as listed.  CBC    Component Value Date/Time   WBC 4.7 08/09/2023 0845   RBC 3.51 (L) 08/09/2023 0845   HGB 12.2 (L) 08/09/2023 0845   HGB 11.2 (L) 01/09/2023 1001   HCT 37.6 (L) 08/09/2023 0845   HCT 35.9 (L) 01/09/2023 1001   PLT 125 (L) 08/09/2023 0845   PLT 131 (L) 01/09/2023 1001   MCV 107.1 (H) 08/09/2023 0845   MCV 98 (H) 01/09/2023 1001   MCH 34.8 (H) 08/09/2023 0845   MCHC 32.4 08/09/2023 0845   RDW 15.1 08/09/2023 0845   RDW 14.3 01/09/2023 1001   LYMPHSABS 1.2 08/09/2023 0845   LYMPHSABS 1.2 01/09/2023 1001   MONOABS 0.8 08/09/2023 0845   EOSABS 0.1 08/09/2023 0845   EOSABS 0.1 01/09/2023 1001   BASOSABS 0.1 08/09/2023 0845   BASOSABS 0.0 01/09/2023 1001      Latest Ref Rng & Units 08/09/2023    8:45 AM 04/19/2023   10:33 AM 01/09/2023   10:01 AM  CMP  Glucose 70 - 99 mg/dL 191  190  125   BUN 8 - 23 mg/dL 13  12  15    Creatinine 0.61 - 1.24 mg/dL 4.40  1.02  7.25   Sodium 135 - 145 mmol/L 139  139  144   Potassium 3.5 - 5.1 mmol/L 3.0  3.7  3.0   Chloride 98 - 111 mmol/L 108  105  107   CO2 22 - 32 mmol/L 22  26  22    Calcium 8.9 - 10.3 mg/dL 9.1  9.2  9.6   Total Protein 6.5 - 8.1 g/dL 6.6  6.9  6.9   Total Bilirubin <1.2 mg/dL 0.8  1.1  0.8   Alkaline Phos 38 - 126 U/L 76  82  96   AST 15 - 41 U/L 37  47  37   ALT 0 - 44 U/L 33  42  30     DIAGNOSTIC IMAGING:  I have independently reviewed the relevant imaging and discussed with the patient.   WRAP UP:  All questions were answered. The patient knows to call the clinic with any problems, questions or concerns.  Medical decision making: Moderate  Time spent on visit: I spent 20 minutes counseling the patient face to face. The total time spent in the appointment was 30 minutes and more than 50% was on counseling.  Carnella Guadalajara, PA-C  08/16/23 9:33 AM

## 2023-08-16 ENCOUNTER — Encounter: Payer: Self-pay | Admitting: Physician Assistant

## 2023-08-16 ENCOUNTER — Inpatient Hospital Stay (HOSPITAL_BASED_OUTPATIENT_CLINIC_OR_DEPARTMENT_OTHER): Payer: BC Managed Care – PPO | Admitting: Physician Assistant

## 2023-08-16 VITALS — BP 119/81 | HR 64 | Temp 98.4°F | Resp 18 | Ht 68.0 in | Wt 215.2 lb

## 2023-08-16 DIAGNOSIS — D696 Thrombocytopenia, unspecified: Secondary | ICD-10-CM

## 2023-08-16 DIAGNOSIS — D509 Iron deficiency anemia, unspecified: Secondary | ICD-10-CM | POA: Diagnosis not present

## 2023-08-16 DIAGNOSIS — D500 Iron deficiency anemia secondary to blood loss (chronic): Secondary | ICD-10-CM

## 2023-08-16 DIAGNOSIS — K31811 Angiodysplasia of stomach and duodenum with bleeding: Secondary | ICD-10-CM | POA: Diagnosis not present

## 2023-08-16 DIAGNOSIS — R768 Other specified abnormal immunological findings in serum: Secondary | ICD-10-CM

## 2023-08-16 DIAGNOSIS — F32A Depression, unspecified: Secondary | ICD-10-CM | POA: Diagnosis not present

## 2023-08-16 DIAGNOSIS — R5383 Other fatigue: Secondary | ICD-10-CM | POA: Diagnosis not present

## 2023-08-16 DIAGNOSIS — K3189 Other diseases of stomach and duodenum: Secondary | ICD-10-CM | POA: Diagnosis not present

## 2023-08-16 DIAGNOSIS — G479 Sleep disorder, unspecified: Secondary | ICD-10-CM | POA: Diagnosis not present

## 2023-08-16 DIAGNOSIS — I480 Paroxysmal atrial fibrillation: Secondary | ICD-10-CM | POA: Diagnosis not present

## 2023-08-16 DIAGNOSIS — G4730 Sleep apnea, unspecified: Secondary | ICD-10-CM | POA: Diagnosis not present

## 2023-08-16 DIAGNOSIS — M2550 Pain in unspecified joint: Secondary | ICD-10-CM | POA: Diagnosis not present

## 2023-08-16 DIAGNOSIS — I350 Nonrheumatic aortic (valve) stenosis: Secondary | ICD-10-CM | POA: Diagnosis not present

## 2023-08-16 DIAGNOSIS — D7589 Other specified diseases of blood and blood-forming organs: Secondary | ICD-10-CM | POA: Diagnosis not present

## 2023-08-16 DIAGNOSIS — R351 Nocturia: Secondary | ICD-10-CM | POA: Diagnosis not present

## 2023-08-16 DIAGNOSIS — E669 Obesity, unspecified: Secondary | ICD-10-CM | POA: Diagnosis not present

## 2023-08-16 DIAGNOSIS — K5730 Diverticulosis of large intestine without perforation or abscess without bleeding: Secondary | ICD-10-CM | POA: Diagnosis not present

## 2023-08-16 HISTORY — DX: Iron deficiency anemia secondary to blood loss (chronic): D50.0

## 2023-08-16 NOTE — Patient Instructions (Signed)
[facility] at [facility] **VISIT SUMMARY & IMPORTANT INSTRUCTIONS **   You were seen today by Rojelio Brenner PA-C for your follow-up visit.    IRON DEFICIENCY ANEMIA Your blood and iron levels are low, despite taking daily iron supplement. We will schedule you for IV iron x 3 doses.  Please see the attached handout for important information regarding potential side effects of IV iron. You should still continue to take your iron tablet (ferrous sulfate 325 mg) daily along with stool softener.  LOW PLATELETS Your low platelets are related to your liver cirrhosis. Platelets remain mildly low but stable. You do not need any treatment for your low platelets at this time.  ABNORMAL PROTEIN: Your labs showed mildly elevated serum free light chains (a type of protein made by your body).  This is most likely related to your liver cirrhosis, since your labs did not show any evidence of cancerous protein.  FOLLOW-UP APPOINTMENT: Labs and office visit in 3 months  ** Thank you for trusting me with your healthcare!  I strive to provide all of my patients with quality care at each visit.  If you receive a survey for this visit, I would be so grateful to you for taking the time to provide feedback.  Thank you in advance!  ~ Nathaneil Feagans                   Dr. Doreatha Massed   &   Rojelio Brenner, PA-C   - - - - - - - - - - - - - - - - - -    Thank you for choosing [facility] at [facility] to provide your oncology and hematology care.  To afford each patient quality time with our provider, please arrive at least 15 minutes before your scheduled appointment time.   If you have a lab appointment with the Cancer Center please come in thru the Main Entrance and check in at the main information desk.  You need to re-schedule your appointment should you arrive 10 or more minutes late.  We strive to give you quality time with our providers, and arriving  late affects you and other patients whose appointments are after yours.  Also, if you no show three or more times for appointments you may be dismissed from the clinic at the providers discretion.     Again, thank you for choosing [facility].  Our hope is that these requests will decrease the amount of time that you wait before being seen by our physicians.       _____________________________________________________________  Should you have questions after your visit to [facility], please contact our office at [phone] and follow the prompts.  Our office hours are 8:00 a.m. and 4:30 p.m. Monday - Friday.  Please note that voicemails left after 4:00 p.m. may not be returned until the following business day.  We are closed weekends and major holidays.  You do have access to a nurse 24-7, just call the main number to the clinic [phone] and do not press any options, hold on the line and a nurse will answer the phone.    For prescription refill requests, have your pharmacy contact our office and allow 72 hours.

## 2023-08-17 NOTE — Telephone Encounter (Signed)
Where were they suppose to be sent?

## 2023-08-21 LAB — IMMUNOFIXATION ELECTROPHORESIS
IgA: 712 mg/dL — ABNORMAL HIGH (ref 61–437)
IgG (Immunoglobin G), Serum: 1261 mg/dL (ref 603–1613)
IgM (Immunoglobulin M), Srm: 102 mg/dL (ref 15–143)
Total Protein ELP: 6.1 g/dL (ref 6.0–8.5)

## 2023-08-24 NOTE — Progress Notes (Signed)
Updated dosage form of cetirizine to IV as 5 mg tablet not available and not scored to break evenly for 5 mg dose.  Pryor Ochoa, PharmD 08/24/2023 @ 1100

## 2023-09-01 ENCOUNTER — Inpatient Hospital Stay: Payer: BC Managed Care – PPO | Attending: Physician Assistant

## 2023-09-01 VITALS — BP 110/67 | HR 52 | Temp 97.4°F | Resp 18

## 2023-09-01 DIAGNOSIS — Z79899 Other long term (current) drug therapy: Secondary | ICD-10-CM | POA: Insufficient documentation

## 2023-09-01 DIAGNOSIS — D696 Thrombocytopenia, unspecified: Secondary | ICD-10-CM | POA: Insufficient documentation

## 2023-09-01 DIAGNOSIS — D500 Iron deficiency anemia secondary to blood loss (chronic): Secondary | ICD-10-CM

## 2023-09-01 DIAGNOSIS — K3189 Other diseases of stomach and duodenum: Secondary | ICD-10-CM | POA: Diagnosis not present

## 2023-09-01 MED ORDER — CETIRIZINE HCL 10 MG/ML IV SOLN
5.0000 mg | Freq: Once | INTRAVENOUS | Status: AC
  Administered 2023-09-01: 5 mg via INTRAVENOUS
  Filled 2023-09-01: qty 1

## 2023-09-01 MED ORDER — SODIUM CHLORIDE 0.9 % IV SOLN
300.0000 mg | Freq: Once | INTRAVENOUS | Status: AC
  Administered 2023-09-01: 300 mg via INTRAVENOUS
  Filled 2023-09-01: qty 300

## 2023-09-01 MED ORDER — ACETAMINOPHEN 325 MG PO TABS
650.0000 mg | ORAL_TABLET | Freq: Once | ORAL | Status: AC
  Administered 2023-09-01: 650 mg via ORAL
  Filled 2023-09-01: qty 2

## 2023-09-01 MED ORDER — SODIUM CHLORIDE 0.9 % IV SOLN: INTRAVENOUS | Status: DC

## 2023-09-01 NOTE — Progress Notes (Signed)
 Venofer iron infusion given per orders. Patient tolerated it well without problems. Vitals stable and discharged home from clinic via wheelchair. Follow up as scheduled.

## 2023-09-01 NOTE — Patient Instructions (Signed)
 [facility] - A DEPT OF [facility]  Discharge Instructions: Thank you for choosing [facility] to provide your oncology and hematology care.  If you have a lab appointment with the Cancer Center - please note that after April 8th, 2024, all labs will be drawn in the cancer center.  You do not have to check in or register with the main entrance as you have in the past but will complete your check-in in the cancer center.  Wear comfortable clothing and clothing appropriate for easy access to any Portacath or PICC line.   We strive to give you quality time with your provider. You may need to reschedule your appointment if you arrive late (15 or more minutes).  Arriving late affects you and other patients whose appointments are after yours.  Also, if you miss three or more appointments without notifying the office, you may be dismissed from the clinic at the provider's discretion.      For prescription refill requests, have your pharmacy contact our office and allow 72 hours for refills to be completed.    Today you received the following iron  infusion, Venofer    To help prevent nausea and vomiting after your treatment, we encourage you to take your nausea medication as directed.  BELOW ARE SYMPTOMS THAT SHOULD BE REPORTED IMMEDIATELY: *FEVER GREATER THAN 100.4 F (38 C) OR HIGHER *CHILLS OR SWEATING *NAUSEA AND VOMITING THAT IS NOT CONTROLLED WITH YOUR NAUSEA MEDICATION *UNUSUAL SHORTNESS OF BREATH *UNUSUAL BRUISING OR BLEEDING *URINARY PROBLEMS (pain or burning when urinating, or frequent urination) *BOWEL PROBLEMS (unusual diarrhea, constipation, pain near the anus) TENDERNESS IN MOUTH AND THROAT WITH OR WITHOUT PRESENCE OF ULCERS (sore throat, sores in mouth, or a toothache) UNUSUAL RASH, SWELLING OR PAIN  UNUSUAL VAGINAL DISCHARGE OR ITCHING   Items with * indicate a potential emergency and should be followed up as soon as possible or go to  the Emergency Department if any problems should occur.  Please show the CHEMOTHERAPY ALERT CARD or IMMUNOTHERAPY ALERT CARD at check-in to the Emergency Department and triage nurse.  Should you have questions after your visit or need to cancel or reschedule your appointment, please contact [facility] [place] - A DEPT OF [facility] [phone]  and follow the prompts.  Office hours are 8:00 a.m. to 4:30 p.m. Monday - Friday. Please note that voicemails left after 4:00 p.m. may not be returned until the following business day.  We are closed weekends and major holidays. You have access to a nurse at all times for urgent questions. Please call the main number to the clinic [phone] and follow the prompts.  For any non-urgent questions, you may also contact your provider using MyChart. We now offer e-Visits for anyone 19 and older to request care online for non-urgent symptoms. For details visit [email or web address].   Also download the MyChart app! Go to the app store, search MyChart, open the app, select [facility], and log in with your MyChart username and password.

## 2023-09-02 DIAGNOSIS — N182 Chronic kidney disease, stage 2 (mild): Secondary | ICD-10-CM | POA: Diagnosis not present

## 2023-09-02 DIAGNOSIS — E1122 Type 2 diabetes mellitus with diabetic chronic kidney disease: Secondary | ICD-10-CM | POA: Diagnosis not present

## 2023-09-02 DIAGNOSIS — Z794 Long term (current) use of insulin: Secondary | ICD-10-CM | POA: Diagnosis not present

## 2023-09-07 DIAGNOSIS — N182 Chronic kidney disease, stage 2 (mild): Secondary | ICD-10-CM | POA: Diagnosis not present

## 2023-09-07 DIAGNOSIS — Z794 Long term (current) use of insulin: Secondary | ICD-10-CM | POA: Diagnosis not present

## 2023-09-07 DIAGNOSIS — E1122 Type 2 diabetes mellitus with diabetic chronic kidney disease: Secondary | ICD-10-CM | POA: Diagnosis not present

## 2023-09-08 ENCOUNTER — Inpatient Hospital Stay: Payer: BC Managed Care – PPO

## 2023-09-08 VITALS — BP 121/58 | HR 58 | Temp 98.0°F | Resp 18 | Wt 212.0 lb

## 2023-09-08 DIAGNOSIS — D500 Iron deficiency anemia secondary to blood loss (chronic): Secondary | ICD-10-CM

## 2023-09-08 DIAGNOSIS — K3189 Other diseases of stomach and duodenum: Secondary | ICD-10-CM | POA: Diagnosis not present

## 2023-09-08 DIAGNOSIS — D696 Thrombocytopenia, unspecified: Secondary | ICD-10-CM | POA: Diagnosis not present

## 2023-09-08 DIAGNOSIS — Z79899 Other long term (current) drug therapy: Secondary | ICD-10-CM | POA: Diagnosis not present

## 2023-09-08 MED ORDER — ACETAMINOPHEN 325 MG PO TABS
650.0000 mg | ORAL_TABLET | Freq: Once | ORAL | Status: AC
  Administered 2023-09-08: 650 mg via ORAL
  Filled 2023-09-08: qty 2

## 2023-09-08 MED ORDER — SODIUM CHLORIDE 0.9 % IV SOLN: INTRAVENOUS | Status: DC

## 2023-09-08 MED ORDER — CETIRIZINE HCL 10 MG/ML IV SOLN
5.0000 mg | Freq: Once | INTRAVENOUS | Status: AC
  Administered 2023-09-08: 5 mg via INTRAVENOUS
  Filled 2023-09-08: qty 1

## 2023-09-08 MED ORDER — SODIUM CHLORIDE 0.9 % IV SOLN
300.0000 mg | Freq: Once | INTRAVENOUS | Status: AC
  Administered 2023-09-08: 300 mg via INTRAVENOUS
  Filled 2023-09-08: qty 300

## 2023-09-08 NOTE — Patient Instructions (Signed)
 [facility] [place] - A DEPT OF [facility]  Discharge Instructions: Thank you for choosing  Cancer Center to provide your oncology and hematology care.  If you have a lab appointment with the Cancer Center - please note that after April 8th, 2024, all labs will be drawn in the cancer center.  You do not have to check in or register with the main entrance as you have in the past but will complete your check-in in the cancer center.  Wear comfortable clothing and clothing appropriate for easy access to any Portacath or PICC line.   We strive to give you quality time with your provider. You may need to reschedule your appointment if you arrive late (15 or more minutes).  Arriving late affects you and other patients whose appointments are after yours.  Also, if you miss three or more appointments without notifying the office, you may be dismissed from the clinic at the provider's discretion.      For prescription refill requests, have your pharmacy contact our office and allow 72 hours for refills to be completed.    Today you received the following chemotherapy and/or immunotherapy agents Venofer .Iron Sucrose Injection What is this medication? IRON SUCROSE (EYE ern SOO krose) treats low levels of iron (iron deficiency anemia) in people with kidney disease. Iron is a mineral that plays an important role in making red blood cells, which carry oxygen from your lungs to the rest of your body. This medicine may be used for other purposes; ask your health care provider or pharmacist if you have questions. COMMON BRAND NAME(S): Venofer What should I tell my care team before I take this medication? They need to know if you have any of these conditions: Anemia not caused by low iron levels Heart disease High levels of iron in the blood Kidney disease Liver disease An unusual or allergic reaction to iron, other medications, foods, dyes, or preservatives Pregnant or  trying to get pregnant Breastfeeding How should I use this medication? This medication is for infusion into a vein. It is given in a hospital or clinic setting. Talk to your care team about the use of this medication in children. While this medication may be prescribed for children as young as 2 years for selected conditions, precautions do apply. Overdosage: If you think you have taken too much of this medicine contact a poison control center or emergency room at once. NOTE: This medicine is only for you. Do not share this medicine with others. What if I miss a dose? Keep appointments for follow-up doses. It is important not to miss your dose. Call your care team if you are unable to keep an appointment. What may interact with this medication? Do not take this medication with any of the following: Deferoxamine Dimercaprol Other iron products This medication may also interact with the following: Chloramphenicol Deferasirox This list may not describe all possible interactions. Give your health care provider a list of all the medicines, herbs, non-prescription drugs, or dietary supplements you use. Also tell them if you smoke, drink alcohol, or use illegal drugs. Some items may interact with your medicine. What should I watch for while using this medication? Visit your care team regularly. Tell your care team if your symptoms do not start to get better or if they get worse. You may need blood work done while you are taking this medication. You may need to follow a special diet. Talk to your care  team. Foods that contain iron include: whole grains/cereals, dried fruits, beans, or peas, leafy green vegetables, and organ meats (liver, kidney). What side effects may I notice from receiving this medication? Side effects that you should report to your care team as soon as possible: Allergic reactions--skin rash, itching, hives, swelling of the face, lips, tongue, or throat Low blood  pressure--dizziness, feeling faint or lightheaded, blurry vision Shortness of breath Side effects that usually do not require medical attention (report to your care team if they continue or are bothersome): Flushing Headache Joint pain Muscle pain Nausea Pain, redness, or irritation at injection site This list may not describe all possible side effects. Call your doctor for medical advice about side effects. You may report side effects to FDA at 1-800-FDA-1088. Where should I keep my medication? This medication is given in a hospital or clinic. It will not be stored at home. NOTE: This sheet is a summary. It may not cover all possible information. If you have questions about this medicine, talk to your doctor, pharmacist, or health care provider.  2024 Elsevier/Gold Standard (2023-01-20 00:00:00)       To help prevent nausea and vomiting after your treatment, we encourage you to take your nausea medication as directed.  BELOW ARE SYMPTOMS THAT SHOULD BE REPORTED IMMEDIATELY: *FEVER GREATER THAN 100.4 F (38 C) OR HIGHER *CHILLS OR SWEATING *NAUSEA AND VOMITING THAT IS NOT CONTROLLED WITH YOUR NAUSEA MEDICATION *UNUSUAL SHORTNESS OF BREATH *UNUSUAL BRUISING OR BLEEDING *URINARY PROBLEMS (pain or burning when urinating, or frequent urination) *BOWEL PROBLEMS (unusual diarrhea, constipation, pain near the anus) TENDERNESS IN MOUTH AND THROAT WITH OR WITHOUT PRESENCE OF ULCERS (sore throat, sores in mouth, or a toothache) UNUSUAL RASH, SWELLING OR PAIN  UNUSUAL VAGINAL DISCHARGE OR ITCHING   Items with * indicate a potential emergency and should be followed up as soon as possible or go to the Emergency Department if any problems should occur.  Please show the CHEMOTHERAPY ALERT CARD or IMMUNOTHERAPY ALERT CARD at check-in to the Emergency Department and triage nurse.  Should you have questions after your visit or need to cancel or reschedule your appointment, please contact [facility] CANCER  CTR [place] - A DEPT OF Eligha Bridegroom [facility] [phone]  and follow the prompts.  Office hours are 8:00 a.m. to 4:30 p.m. Monday - Friday. Please note that voicemails left after 4:00 p.m. may not be returned until the following business day.  We are closed weekends and major holidays. You have access to a nurse at all times for urgent questions. Please call the main number to the clinic [phone] and follow the prompts.  For any non-urgent questions, you may also contact your provider using MyChart. We now offer e-Visits for anyone 68 and older to request care online for non-urgent symptoms. For details visit [email or web address].   Also download the MyChart app! Go to the app store, search "MyChart", open the app, select [place], and log in with your MyChart username and password.

## 2023-09-08 NOTE — Progress Notes (Signed)
 Patient presents today for Venofer  infusion. Patient denies any side effects related to the last iron  infusion. Patient has no complaints today. MAR reviewed and updated.   Venofer  given today per MD orders. Tolerated infusion without adverse affects. Vital signs stable. No complaints at this time. Discharged from clinic by wheel chair in stable condition. Alert and oriented x 3. F/U with [facility] as scheduled.

## 2023-09-13 ENCOUNTER — Encounter: Payer: Self-pay | Admitting: Family Medicine

## 2023-09-13 ENCOUNTER — Ambulatory Visit (INDEPENDENT_AMBULATORY_CARE_PROVIDER_SITE_OTHER): Payer: BC Managed Care – PPO | Admitting: Family Medicine

## 2023-09-13 VITALS — BP 102/67 | HR 82 | Temp 98.3°F | Ht 68.0 in | Wt 209.8 lb

## 2023-09-13 DIAGNOSIS — E1159 Type 2 diabetes mellitus with other circulatory complications: Secondary | ICD-10-CM | POA: Diagnosis not present

## 2023-09-13 DIAGNOSIS — E785 Hyperlipidemia, unspecified: Secondary | ICD-10-CM | POA: Diagnosis not present

## 2023-09-13 DIAGNOSIS — Z794 Long term (current) use of insulin: Secondary | ICD-10-CM | POA: Diagnosis not present

## 2023-09-13 DIAGNOSIS — E1122 Type 2 diabetes mellitus with diabetic chronic kidney disease: Secondary | ICD-10-CM | POA: Diagnosis not present

## 2023-09-13 DIAGNOSIS — N182 Chronic kidney disease, stage 2 (mild): Secondary | ICD-10-CM | POA: Diagnosis not present

## 2023-09-13 DIAGNOSIS — E1169 Type 2 diabetes mellitus with other specified complication: Secondary | ICD-10-CM | POA: Diagnosis not present

## 2023-09-13 DIAGNOSIS — E162 Hypoglycemia, unspecified: Secondary | ICD-10-CM

## 2023-09-13 DIAGNOSIS — I152 Hypertension secondary to endocrine disorders: Secondary | ICD-10-CM

## 2023-09-13 LAB — BAYER DCA HB A1C WAIVED: HB A1C (BAYER DCA - WAIVED): 4.6 % — ABNORMAL LOW (ref 4.8–5.6)

## 2023-09-13 NOTE — Progress Notes (Signed)
 Subjective: CC:DM PCP: Tony Guerin, DO ZOX:[id number] Tony Cantu is a 73 y.o. male presenting to clinic today for:  1. Type 2 Diabetes with hypertension, hyperlipidemia:  Glucometer: Utilizing CGM for monitoring of sugar.  He has had multiple episodes of hypoglycemia over the last several months.  He feels it is less frequent than it had been but still occurs about every couple of days.  Currently injecting 54 units because "sugar had gotten too high".  When I asked him what "too high was" he notes 120s.  He admits that when sugar is going down he does get tired.  He is injecting about 25 units of Humalog  with meals.  He reports mechanical fall that happened a couple months ago.  Diabetes Health Maintenance Due  Topic Date Due   OPHTHALMOLOGY EXAM  07/05/2023   FOOT EXAM  08/25/2023   HEMOGLOBIN A1C  10/27/2023    Last A1c:  Lab Results  Component Value Date   HGBA1C 4.2 (L) 04/26/2023    ROS: No chest pain or shortness of breath reported  ROS: Per HPI  Allergies  Allergen Reactions   Dimetapp Children's Cold-Cough     Chest discomfort    Erythromycin Diarrhea   Sulfonamide Derivatives Diarrhea   Past Medical History:  Diagnosis Date   Aortic atherosclerosis (HCC)    Aortic stenosis    Arthritis    Asthma    BPH (benign prostatic hypertrophy)    Cirrhosis (HCC) 2016   Colon polyps    Diabetes mellitus without complication (HCC)    TYPE 1    Diverticulosis    Dysrhythmia 04/19/2021   A-fib noted with possible A-fib RVR   Gastric ulcer    Gastric varices    right   Gastritis    GERD (gastroesophageal reflux disease)    Headache    History- pt states these were migraines that occurred in the 1980's   Heart murmur    History of kidney stones    Hx of adenomatous colonic polyps    Hypertension    Iron  deficiency anemia due to chronic blood loss 08/16/2023   PONV (postoperative nausea and vomiting)    pt states he has never had post op nausea or vomiting    Portal hypertensive gastropathy (HCC)    Renal cyst, right    Seizures (HCC)    HYPOGLYCEMIC LAST 1 AND 1/2 YRS AGO   Sleep apnea    uses CPAP nightly   Testicle trouble    one testicle BORN WITH   Tubular adenoma of colon     Current Outpatient Medications:    acetaminophen  (TYLENOL ) 500 MG tablet, Take 1,000 mg by mouth every 6 (six) hours as needed for moderate pain., Disp: , Rfl:    apixaban  (ELIQUIS ) 5 MG TABS tablet, Take 1 tablet (5 mg total) by mouth 2 (two) times daily., Disp: 180 tablet, Rfl: 3   atorvastatin  (LIPITOR) 40 MG tablet, Take 1 tablet (40 mg total) by mouth daily., Disp: 90 tablet, Rfl: 3   b complex vitamins tablet, Take 1 tablet by mouth daily., Disp: , Rfl:    Blood Glucose Monitoring Suppl (CONTOUR NEXT ONE) KIT, Test BS QID and as needed Dx E11.9, Disp: 500 kit, Rfl: 3   Cholecalciferol  (DIALYVITE VITAMIN D  5000) 125 MCG (5000 UT) capsule, Take 5,000 Units by mouth daily., Disp: , Rfl:    Chromium Picolinate (CHROMIUM PICOLATE PO), Take 1 tablet by mouth daily., Disp: , Rfl:    CINNAMON PO,  Take 1,080 mg by mouth daily., Disp: , Rfl:    Coenzyme Q10 (COQ10) 100 MG CAPS, Take 100 mg by mouth daily., Disp: , Rfl:    Continuous Blood Gluc Sensor (FREESTYLE LIBRE SENSOR SYSTEM) MISC, Check BS eight (8) times a day. Dx E10.9, Disp: 7 each, Rfl: 3   diltiazem  (CARDIZEM ) 30 MG tablet, Take 1 tablet (30 mg total) by mouth 2 (two) times daily. Patient must schedule annual office visit for further refills, Disp: 15 tablet, Rfl: 0   ferrous sulfate  325 (65 FE) MG EC tablet, Take 1 tablet (325 mg total) by mouth daily with breakfast., Disp: 90 tablet, Rfl: 3   furosemide  (LASIX ) 20 MG tablet, Take 1.5 tablets (30 mg total) by mouth daily., Disp: 135 tablet, Rfl: 3   Glucagon , rDNA, (GLUCAGON  EMERGENCY) 1 MG KIT, INJECT AS DIRECTED INTO  UPPER ARM, THIGH OR  BUTTOCKS AS NEEDED FOR  SEVERE HYPOGLYCEMIA. SEEK  MEDICAL ATTENTION AFTER USE (Patient taking differently: Inject 1 mg  into the muscle as needed (HYPOGLYCEMIA).), Disp: 3 kit, Rfl: 0   Glucosamine-Chondroit-Vit C-Mn (GLUCOSAMINE 1500 COMPLEX) CAPS, Take 2 capsules by mouth daily., Disp: , Rfl:    glucose blood (CONTOUR NEXT TEST) test strip, Test BS QID and as needed Dx E11.9, Disp: 400 strip, Rfl: 3   insulin  glargine, 2 Unit Dial , (TOUJEO  MAX SOLOSTAR) 300 UNIT/ML Solostar Pen, Inject 20-50 Units into the skin daily., Disp: 15 mL, Rfl: 3   insulin  lispro (HUMALOG ) 100 UNIT/ML injection, Inject 0.2-0.3 mLs (20-30 Units total) into the skin 3 (three) times daily with meals. And per sliding scale.  ** dose change, Disp: 80 mL, Rfl: 3   Magnesium  250 MG TABS, Take 250 mg by mouth daily., Disp: , Rfl:    METAMUCIL FIBER PO, Take 1 capsule by mouth 2 (two) times daily., Disp: , Rfl:    Microlet Lancets MISC, Test BS QID and as needed Dx E11.9, Disp: 500 each, Rfl: 3   montelukast  (SINGULAIR ) 10 MG tablet, Take 1 tablet (10 mg total) by mouth at bedtime., Disp: 90 tablet, Rfl: 3   Multiple Vitamins-Minerals (EYE HEALTH) CAPS, Take 1 capsule by mouth daily., Disp: , Rfl:    oxyCODONE  (OXY IR/ROXICODONE ) 5 MG immediate release tablet, Take 5 mg by mouth 2 (two) times daily as needed., Disp: , Rfl:    pantoprazole  (PROTONIX ) 40 MG tablet, TAKE 1 TABLET BY MOUTH TWICE  DAILY BEFORE MEALS, Disp: 180 tablet, Rfl: 0   potassium chloride  SA (KLOR-CON  M) 20 MEQ tablet, Take 1 tablet (20 mEq total) by mouth daily., Disp: 90 tablet, Rfl: 3   ramelteon  (ROZEREM ) 8 MG tablet, Take 1 tablet (8 mg total) by mouth at bedtime., Disp: 90 tablet, Rfl: 3   Testosterone  30 MG/ACT SOLN, Apply 1 pump under each axilla once daily., Disp: 270 mL, Rfl: 1   tiZANidine  (ZANAFLEX ) 2 MG tablet, Take 2 mg by mouth every 8 (eight) hours as needed for muscle spasms., Disp: , Rfl:    Turmeric Curcumin 500 MG CAPS, Take 500 mg by mouth daily., Disp: , Rfl:    valsartan  (DIOVAN ) 40 MG tablet, Take 1 tablet (40 mg total) by mouth daily., Disp: 90 tablet,  Rfl: 3 Social History   Socioeconomic History   Marital status: Married    Spouse name: Not on file   Number of children: 1   Years of education: Not on file   Highest education level: Not on file  Occupational History   Occupation: retired  Tobacco Use   Smoking status: Former    Current packs/day: 0.00    Types: Cigarettes, Pipe    Start date: 13    Quit date: 2005    Years since quitting: 20.0   Smokeless tobacco: Never   Tobacco comments:    quit 2005 smoked cigarettes for 5 yrs prior to pipe use  Vaping Use   Vaping status: Never Used  Substance and Sexual Activity   Alcohol use: Not Currently   Drug use: No   Sexual activity: Yes  Other Topics Concern   Not on file  Social History Narrative   ** Merged History Encounter **    Right handed    Social Drivers of Health   Financial Resource Strain: Low Risk  (06/09/2022)   Overall Financial Resource Strain (CARDIA)    Difficulty of Paying Living Expenses: Not hard at all  Food Insecurity: No Food Insecurity (06/06/2022)   Hunger Vital Sign    Worried About Running Out of Food in the Last Year: Never true    Ran Out of Food in the Last Year: Never true  Transportation Needs: No Transportation Needs (06/09/2022)   PRAPARE - Administrator, Civil Service (Medical): No    Lack of Transportation (Non-Medical): No  Physical Activity: Not on file  Stress: Not on file  Social Connections: Not on file  Intimate Partner Violence: Not At Risk (06/06/2022)   Humiliation, Afraid, Rape, and Kick questionnaire    Fear of Current or Ex-Partner: No    Emotionally Abused: No    Physically Abused: No    Sexually Abused: No   Family History  Problem Relation Age of Onset   Heart failure Mother    Heart attack Mother        in her 13's   Alzheimer's disease Mother    Diabetes Mother    Asthma Father    Suicidality Father        in his 7's   Allergic rhinitis Sister    Heart failure Maternal Grandmother     Rheum arthritis Maternal Grandmother    Breast cancer Maternal Grandmother    Heart attack Maternal Grandmother        in her 28's   Colon cancer Neg Hx    Esophageal cancer Neg Hx    Pancreatic cancer Neg Hx    Liver disease Neg Hx     Objective: Office vital signs reviewed. BP 102/67   Pulse 82   Temp 98.3 F (36.8 C)   Ht 5\' 8"  (1.727 m)   Wt 209 lb 12.8 oz (95.2 kg)   SpO2 97%   BMI 31.90 kg/m   Physical Examination:  General: Awake, alert, nontoxic male, No acute distress HEENT: Sclera white.  Moist mucous membranes Cardio: regular rate and rhythm, S1S2 heard, 2/6 systolic murmurs appreciated Pulm: clear to auscultation bilaterally, no wheezes, rhonchi or rales; normal work of breathing on room air MSK: Antalgic and slow gait  Assessment/ Plan: 73 y.o. male   Controlled type 2 diabetes mellitus with stage 2 chronic kidney disease, with long-term current use of insulin  (HCC) - Plan: Bayer DCA Hb A1c Waived, Microalbumin / creatinine urine ratio, AMB Referral [facility]BCI Care Management  Hypertension associated with type 2 diabetes mellitus (HCC)  Hyperlipidemia associated with type 2 diabetes mellitus (HCC)  Hypoglycemia - Plan: AMB Referral [facility]BCI Care Management  A1c is controlled but again at the expense of having multiple episodes of hypoglycemia.  I stressed to  him today not to self titrate up on his insulin  unless directed to do so.  He needs to reduce his insulin  back down to 40 units and continue sliding scale for mealtime checks.  We discussed the dangers of low sugar.  I am going to have him follow-up with Con[facility] Deed and I have placed a new referral so that we can to have a closer eye on his blood sugars.  He will continue his cholesterol and blood pressure medications as prescribed  Tony Guerin, DO [facility] [phone]

## 2023-09-14 LAB — MICROALBUMIN / CREATININE URINE RATIO
Creatinine, Urine: 41.1 mg/dL
Microalb/Creat Ratio: 8 mg/g{creat} (ref 0–29)
Microalbumin, Urine: 3.3 ug/mL

## 2023-09-15 ENCOUNTER — Inpatient Hospital Stay: Payer: BC Managed Care – PPO

## 2023-09-15 VITALS — BP 106/58 | HR 56 | Temp 97.4°F | Resp 18

## 2023-09-15 DIAGNOSIS — D500 Iron deficiency anemia secondary to blood loss (chronic): Secondary | ICD-10-CM

## 2023-09-15 DIAGNOSIS — Z79899 Other long term (current) drug therapy: Secondary | ICD-10-CM | POA: Diagnosis not present

## 2023-09-15 DIAGNOSIS — K3189 Other diseases of stomach and duodenum: Secondary | ICD-10-CM | POA: Diagnosis not present

## 2023-09-15 DIAGNOSIS — D696 Thrombocytopenia, unspecified: Secondary | ICD-10-CM | POA: Diagnosis not present

## 2023-09-15 MED ORDER — ACETAMINOPHEN 325 MG PO TABS
650.0000 mg | ORAL_TABLET | Freq: Once | ORAL | Status: AC
  Administered 2023-09-15: 650 mg via ORAL
  Filled 2023-09-15: qty 2

## 2023-09-15 MED ORDER — SODIUM CHLORIDE 0.9 % IV SOLN: INTRAVENOUS | Status: DC

## 2023-09-15 MED ORDER — SODIUM CHLORIDE 0.9 % IV SOLN
300.0000 mg | Freq: Once | INTRAVENOUS | Status: AC
  Administered 2023-09-15: 300 mg via INTRAVENOUS
  Filled 2023-09-15: qty 300

## 2023-09-15 MED ORDER — CETIRIZINE HCL 10 MG/ML IV SOLN
5.0000 mg | Freq: Once | INTRAVENOUS | Status: AC
  Administered 2023-09-15: 5 mg via INTRAVENOUS
  Filled 2023-09-15: qty 1

## 2023-09-15 NOTE — Patient Instructions (Addendum)
[facility] [place] - A DEPT OF [facility]  Discharge Instructions: Thank you for choosing [facility] to provide your oncology and hematology care.  If you have a lab appointment with the Cancer Center - please note that after April 8th, 2024, all labs will be drawn in the cancer center.  You do not have to check in or register with the main entrance as you have in the past but will complete your check-in in the cancer center.  Wear comfortable clothing and clothing appropriate for easy access to any Portacath or PICC line.   We strive to give you quality time with your provider. You may need to reschedule your appointment if you arrive late (15 or more minutes).  Arriving late affects you and other patients whose appointments are after yours.  Also, if you miss three or more appointments without notifying the office, you may be dismissed from the clinic at the provider's discretion.      For prescription refill requests, have your pharmacy contact our office and allow 72 hours for refills to be completed.    Today you received the following:  Venofer.  Iron Sucrose Injection What is this medication? IRON SUCROSE (EYE ern SOO krose) treats low levels of iron (iron deficiency anemia) in people with kidney disease. Iron is a mineral that plays an important role in making red blood cells, which carry oxygen from your lungs to the rest of your body. This medicine may be used for other purposes; ask your health care provider or pharmacist if you have questions. COMMON BRAND NAME(S): Venofer What should I tell my care team before I take this medication? They need to know if you have any of these conditions: Anemia not caused by low iron levels Heart disease High levels of iron in the blood Kidney disease Liver disease An unusual or allergic reaction to iron, other medications, foods, dyes, or preservatives Pregnant or trying to get  pregnant Breastfeeding How should I use this medication? This medication is infused into a vein. It is given by your care team in a hospital or clinic setting. Talk to your care team about the use of this medication in children. While it may be prescribed for children as young as 2 years for selected conditions, precautions do apply. Overdosage: If you think you have taken too much of this medicine contact a poison control center or emergency room at once. NOTE: This medicine is only for you. Do not share this medicine with others. What if I miss a dose? Keep appointments for follow-up doses. It is important not to miss your dose. Call your care team if you are unable to keep an appointment. What may interact with this medication? Do not take this medication with any of the following: Deferoxamine Dimercaprol Other iron products This medication may also interact with the following: Chloramphenicol Deferasirox This list may not describe all possible interactions. Give your health care provider a list of all the medicines, herbs, non-prescription drugs, or dietary supplements you use. Also tell them if you smoke, drink alcohol, or use illegal drugs. Some items may interact with your medicine. What should I watch for while using this medication? Visit your care team for regular checks on your progress. Tell your care team if your symptoms do not start to get better or if they get worse. You may need blood work done while you are taking this medication. You may need to eat more foods  that contain iron. Talk to your care team. Foods that contain iron include whole grains or cereals, dried fruits, beans, peas, leafy green vegetables, and organ meats (liver, kidney). What side effects may I notice from receiving this medication? Side effects that you should report to your care team as soon as possible: Allergic reactions--skin rash, itching, hives, swelling of the face, lips, tongue, or throat Low  blood pressure--dizziness, feeling faint or lightheaded, blurry vision Shortness of breath Side effects that usually do not require medical attention (report to your care team if they continue or are bothersome): Flushing Headache Joint pain Muscle pain Nausea Pain, redness, or irritation at injection site This list may not describe all possible side effects. Call your doctor for medical advice about side effects. You may report side effects to FDA at 1-800-FDA-1088. Where should I keep my medication? This medication is given in a hospital or clinic. It will not be stored at home. NOTE: This sheet is a summary. It may not cover all possible information. If you have questions about this medicine, talk to your doctor, pharmacist, or health care provider.  2024 Elsevier/Gold Standard (2023-04-05 00:00:00)    To help prevent nausea and vomiting after your treatment, we encourage you to take your nausea medication as directed.  BELOW ARE SYMPTOMS THAT SHOULD BE REPORTED IMMEDIATELY: *FEVER GREATER THAN 100.4 F (38 C) OR HIGHER *CHILLS OR SWEATING *NAUSEA AND VOMITING THAT IS NOT CONTROLLED WITH YOUR NAUSEA MEDICATION *UNUSUAL SHORTNESS OF BREATH *UNUSUAL BRUISING OR BLEEDING *URINARY PROBLEMS (pain or burning when urinating, or frequent urination) *BOWEL PROBLEMS (unusual diarrhea, constipation, pain near the anus) TENDERNESS IN MOUTH AND THROAT WITH OR WITHOUT PRESENCE OF ULCERS (sore throat, sores in mouth, or a toothache) UNUSUAL RASH, SWELLING OR PAIN  UNUSUAL VAGINAL DISCHARGE OR ITCHING   Items with * indicate a potential emergency and should be followed up as soon as possible or go to the Emergency Department if any problems should occur.  Please show the CHEMOTHERAPY ALERT CARD or IMMUNOTHERAPY ALERT CARD at check-in to the Emergency Department and triage nurse.  Should you have questions after your visit or need to cancel or reschedule your appointment, please contact [facility]  CANCER CTR [place] - A DEPT OF Eligha Bridegroom [facility] [phone]  and follow the prompts.  Office hours are 8:00 a.m. to 4:30 p.m. Monday - Friday. Please note that voicemails left after 4:00 p.m. may not be returned until the following business day.  We are closed weekends and major holidays. You have access to a nurse at all times for urgent questions. Please call the main number to the clinic [phone] and follow the prompts.  For any non-urgent questions, you may also contact your provider using MyChart. We now offer e-Visits for anyone 15 and older to request care online for non-urgent symptoms. For details visit [email or web address].   Also download the MyChart app! Go to the app store, search "MyChart", open the app, select [place], and log in with your MyChart username and password.

## 2023-09-15 NOTE — Progress Notes (Signed)
Patient presents today for iron infusion.  Patient is in satisfactory condition with no new complaints voiced.  Vital signs are stable.  IV placed in L arm.  IV flushed well with good blood return noted.  We will proceed with infusion per provider orders.    Patient tolerated infusion well with no complaints voiced.  Patient left via wheelchair with wife in stable condition.  Vital signs stable at discharge.  Follow up as scheduled.

## 2023-09-19 ENCOUNTER — Encounter (HOSPITAL_COMMUNITY): Payer: Self-pay | Admitting: Emergency Medicine

## 2023-09-19 ENCOUNTER — Other Ambulatory Visit: Payer: Self-pay

## 2023-09-19 ENCOUNTER — Inpatient Hospital Stay (HOSPITAL_COMMUNITY)
Admission: EM | Admit: 2023-09-19 | Discharge: 2023-09-27 | DRG: 871 | Disposition: A | Discharge status: Skilled Nursing Facility | Payer: BC Managed Care – PPO | Attending: Internal Medicine | Admitting: Internal Medicine

## 2023-09-19 ENCOUNTER — Emergency Department (HOSPITAL_COMMUNITY): Payer: Medicare Other

## 2023-09-19 DIAGNOSIS — Z881 Allergy status to other antibiotic agents status: Secondary | ICD-10-CM

## 2023-09-19 DIAGNOSIS — I272 Pulmonary hypertension, unspecified: Secondary | ICD-10-CM | POA: Diagnosis present

## 2023-09-19 DIAGNOSIS — K766 Portal hypertension: Secondary | ICD-10-CM | POA: Diagnosis present

## 2023-09-19 DIAGNOSIS — E66811 Obesity, class 1: Secondary | ICD-10-CM | POA: Diagnosis not present

## 2023-09-19 DIAGNOSIS — B955 Unspecified streptococcus as the cause of diseases classified elsewhere: Secondary | ICD-10-CM | POA: Diagnosis not present

## 2023-09-19 DIAGNOSIS — R509 Fever, unspecified: Principal | ICD-10-CM

## 2023-09-19 DIAGNOSIS — R2990 Unspecified symptoms and signs involving the nervous system: Secondary | ICD-10-CM | POA: Insufficient documentation

## 2023-09-19 DIAGNOSIS — K7469 Other cirrhosis of liver: Secondary | ICD-10-CM | POA: Diagnosis not present

## 2023-09-19 DIAGNOSIS — I472 Ventricular tachycardia, unspecified: Secondary | ICD-10-CM | POA: Diagnosis present

## 2023-09-19 DIAGNOSIS — D696 Thrombocytopenia, unspecified: Secondary | ICD-10-CM | POA: Diagnosis present

## 2023-09-19 DIAGNOSIS — R6520 Severe sepsis without septic shock: Secondary | ICD-10-CM | POA: Diagnosis present

## 2023-09-19 DIAGNOSIS — D62 Acute posthemorrhagic anemia: Secondary | ICD-10-CM | POA: Diagnosis present

## 2023-09-19 DIAGNOSIS — G4733 Obstructive sleep apnea (adult) (pediatric): Secondary | ICD-10-CM | POA: Diagnosis present

## 2023-09-19 DIAGNOSIS — R29810 Facial weakness: Secondary | ICD-10-CM | POA: Diagnosis present

## 2023-09-19 DIAGNOSIS — E119 Type 2 diabetes mellitus without complications: Secondary | ICD-10-CM | POA: Diagnosis not present

## 2023-09-19 DIAGNOSIS — Z6831 Body mass index (BMI) 31.0-31.9, adult: Secondary | ICD-10-CM

## 2023-09-19 DIAGNOSIS — B3781 Candidal esophagitis: Secondary | ICD-10-CM

## 2023-09-19 DIAGNOSIS — I8510 Secondary esophageal varices without bleeding: Secondary | ICD-10-CM | POA: Diagnosis not present

## 2023-09-19 DIAGNOSIS — J449 Chronic obstructive pulmonary disease, unspecified: Secondary | ICD-10-CM | POA: Diagnosis not present

## 2023-09-19 DIAGNOSIS — R531 Weakness: Secondary | ICD-10-CM | POA: Diagnosis present

## 2023-09-19 DIAGNOSIS — I10 Essential (primary) hypertension: Secondary | ICD-10-CM | POA: Diagnosis not present

## 2023-09-19 DIAGNOSIS — R7401 Elevation of levels of liver transaminase levels: Secondary | ICD-10-CM | POA: Insufficient documentation

## 2023-09-19 DIAGNOSIS — K31811 Angiodysplasia of stomach and duodenum with bleeding: Secondary | ICD-10-CM | POA: Diagnosis not present

## 2023-09-19 DIAGNOSIS — I700 Atherosclerosis of aorta: Secondary | ICD-10-CM | POA: Diagnosis not present

## 2023-09-19 DIAGNOSIS — I5A Non-ischemic myocardial injury (non-traumatic): Secondary | ICD-10-CM | POA: Diagnosis present

## 2023-09-19 DIAGNOSIS — Z860101 Personal history of adenomatous and serrated colon polyps: Secondary | ICD-10-CM

## 2023-09-19 DIAGNOSIS — Z1152 Encounter for screening for COVID-19: Secondary | ICD-10-CM

## 2023-09-19 DIAGNOSIS — R933 Abnormal findings on diagnostic imaging of other parts of digestive tract: Secondary | ICD-10-CM

## 2023-09-19 DIAGNOSIS — K7581 Nonalcoholic steatohepatitis (NASH): Secondary | ICD-10-CM | POA: Diagnosis not present

## 2023-09-19 DIAGNOSIS — A409 Streptococcal sepsis, unspecified: Secondary | ICD-10-CM | POA: Diagnosis not present

## 2023-09-19 DIAGNOSIS — I480 Paroxysmal atrial fibrillation: Secondary | ICD-10-CM | POA: Diagnosis not present

## 2023-09-19 DIAGNOSIS — D649 Anemia, unspecified: Secondary | ICD-10-CM | POA: Diagnosis not present

## 2023-09-19 DIAGNOSIS — E109 Type 1 diabetes mellitus without complications: Secondary | ICD-10-CM | POA: Diagnosis present

## 2023-09-19 DIAGNOSIS — E1122 Type 2 diabetes mellitus with diabetic chronic kidney disease: Secondary | ICD-10-CM

## 2023-09-19 DIAGNOSIS — I8500 Esophageal varices without bleeding: Secondary | ICD-10-CM | POA: Diagnosis not present

## 2023-09-19 DIAGNOSIS — D509 Iron deficiency anemia, unspecified: Secondary | ICD-10-CM | POA: Diagnosis present

## 2023-09-19 DIAGNOSIS — I76 Septic arterial embolism: Secondary | ICD-10-CM | POA: Diagnosis present

## 2023-09-19 DIAGNOSIS — Z820 Family history of epilepsy and other diseases of the nervous system: Secondary | ICD-10-CM

## 2023-09-19 DIAGNOSIS — G40909 Epilepsy, unspecified, not intractable, without status epilepticus: Secondary | ICD-10-CM | POA: Diagnosis not present

## 2023-09-19 DIAGNOSIS — D539 Nutritional anemia, unspecified: Secondary | ICD-10-CM | POA: Diagnosis present

## 2023-09-19 DIAGNOSIS — E8809 Other disorders of plasma-protein metabolism, not elsewhere classified: Secondary | ICD-10-CM | POA: Diagnosis present

## 2023-09-19 DIAGNOSIS — I350 Nonrheumatic aortic (valve) stenosis: Secondary | ICD-10-CM | POA: Diagnosis not present

## 2023-09-19 DIAGNOSIS — Z79899 Other long term (current) drug therapy: Secondary | ICD-10-CM

## 2023-09-19 DIAGNOSIS — I340 Nonrheumatic mitral (valve) insufficiency: Secondary | ICD-10-CM | POA: Diagnosis not present

## 2023-09-19 DIAGNOSIS — N400 Enlarged prostate without lower urinary tract symptoms: Secondary | ICD-10-CM | POA: Diagnosis present

## 2023-09-19 DIAGNOSIS — Z803 Family history of malignant neoplasm of breast: Secondary | ICD-10-CM

## 2023-09-19 DIAGNOSIS — G9341 Metabolic encephalopathy: Secondary | ICD-10-CM | POA: Diagnosis not present

## 2023-09-19 DIAGNOSIS — I083 Combined rheumatic disorders of mitral, aortic and tricuspid valves: Secondary | ICD-10-CM | POA: Diagnosis present

## 2023-09-19 DIAGNOSIS — Z794 Long term (current) use of insulin: Secondary | ICD-10-CM | POA: Diagnosis not present

## 2023-09-19 DIAGNOSIS — N289 Disorder of kidney and ureter, unspecified: Secondary | ICD-10-CM

## 2023-09-19 DIAGNOSIS — R262 Difficulty in walking, not elsewhere classified: Secondary | ICD-10-CM | POA: Diagnosis not present

## 2023-09-19 DIAGNOSIS — E876 Hypokalemia: Secondary | ICD-10-CM | POA: Diagnosis present

## 2023-09-19 DIAGNOSIS — I493 Ventricular premature depolarization: Secondary | ICD-10-CM | POA: Diagnosis not present

## 2023-09-19 DIAGNOSIS — Z8673 Personal history of transient ischemic attack (TIA), and cerebral infarction without residual deficits: Secondary | ICD-10-CM

## 2023-09-19 DIAGNOSIS — R7881 Bacteremia: Secondary | ICD-10-CM | POA: Insufficient documentation

## 2023-09-19 DIAGNOSIS — I4510 Unspecified right bundle-branch block: Secondary | ICD-10-CM | POA: Diagnosis present

## 2023-09-19 DIAGNOSIS — K921 Melena: Secondary | ICD-10-CM

## 2023-09-19 DIAGNOSIS — Z833 Family history of diabetes mellitus: Secondary | ICD-10-CM

## 2023-09-19 DIAGNOSIS — D500 Iron deficiency anemia secondary to blood loss (chronic): Secondary | ICD-10-CM | POA: Diagnosis not present

## 2023-09-19 DIAGNOSIS — K229 Disease of esophagus, unspecified: Secondary | ICD-10-CM | POA: Diagnosis present

## 2023-09-19 DIAGNOSIS — R278 Other lack of coordination: Secondary | ICD-10-CM | POA: Diagnosis not present

## 2023-09-19 DIAGNOSIS — I4729 Other ventricular tachycardia: Secondary | ICD-10-CM | POA: Diagnosis not present

## 2023-09-19 DIAGNOSIS — A419 Sepsis, unspecified organism: Secondary | ICD-10-CM | POA: Diagnosis present

## 2023-09-19 DIAGNOSIS — Z96653 Presence of artificial knee joint, bilateral: Secondary | ICD-10-CM | POA: Diagnosis present

## 2023-09-19 DIAGNOSIS — K31819 Angiodysplasia of stomach and duodenum without bleeding: Secondary | ICD-10-CM | POA: Diagnosis not present

## 2023-09-19 DIAGNOSIS — R1319 Other dysphagia: Secondary | ICD-10-CM | POA: Diagnosis not present

## 2023-09-19 DIAGNOSIS — K3189 Other diseases of stomach and duodenum: Secondary | ICD-10-CM | POA: Diagnosis present

## 2023-09-19 DIAGNOSIS — Z825 Family history of asthma and other chronic lower respiratory diseases: Secondary | ICD-10-CM

## 2023-09-19 DIAGNOSIS — Z1611 Resistance to penicillins: Secondary | ICD-10-CM | POA: Diagnosis present

## 2023-09-19 DIAGNOSIS — Z7901 Long term (current) use of anticoagulants: Secondary | ICD-10-CM

## 2023-09-19 DIAGNOSIS — G9340 Encephalopathy, unspecified: Secondary | ICD-10-CM | POA: Diagnosis not present

## 2023-09-19 DIAGNOSIS — R4182 Altered mental status, unspecified: Secondary | ICD-10-CM | POA: Diagnosis not present

## 2023-09-19 DIAGNOSIS — K219 Gastro-esophageal reflux disease without esophagitis: Secondary | ICD-10-CM | POA: Diagnosis present

## 2023-09-19 DIAGNOSIS — Z7401 Bed confinement status: Secondary | ICD-10-CM | POA: Diagnosis not present

## 2023-09-19 DIAGNOSIS — Z882 Allergy status to sulfonamides status: Secondary | ICD-10-CM

## 2023-09-19 DIAGNOSIS — Z87891 Personal history of nicotine dependence: Secondary | ICD-10-CM

## 2023-09-19 DIAGNOSIS — Z8719 Personal history of other diseases of the digestive system: Secondary | ICD-10-CM | POA: Diagnosis not present

## 2023-09-19 DIAGNOSIS — E785 Hyperlipidemia, unspecified: Secondary | ICD-10-CM | POA: Diagnosis present

## 2023-09-19 DIAGNOSIS — M1711 Unilateral primary osteoarthritis, right knee: Secondary | ICD-10-CM | POA: Diagnosis not present

## 2023-09-19 DIAGNOSIS — Z8711 Personal history of peptic ulcer disease: Secondary | ICD-10-CM

## 2023-09-19 DIAGNOSIS — Z96611 Presence of right artificial shoulder joint: Secondary | ICD-10-CM | POA: Diagnosis present

## 2023-09-19 DIAGNOSIS — G8191 Hemiplegia, unspecified affecting right dominant side: Secondary | ICD-10-CM | POA: Diagnosis not present

## 2023-09-19 DIAGNOSIS — M6281 Muscle weakness (generalized): Secondary | ICD-10-CM | POA: Diagnosis not present

## 2023-09-19 DIAGNOSIS — K7460 Unspecified cirrhosis of liver: Secondary | ICD-10-CM | POA: Diagnosis not present

## 2023-09-19 DIAGNOSIS — Z87442 Personal history of urinary calculi: Secondary | ICD-10-CM

## 2023-09-19 DIAGNOSIS — I864 Gastric varices: Secondary | ICD-10-CM | POA: Diagnosis present

## 2023-09-19 DIAGNOSIS — Z8249 Family history of ischemic heart disease and other diseases of the circulatory system: Secondary | ICD-10-CM

## 2023-09-19 DIAGNOSIS — R410 Disorientation, unspecified: Secondary | ICD-10-CM | POA: Diagnosis not present

## 2023-09-19 DIAGNOSIS — Z8261 Family history of arthritis: Secondary | ICD-10-CM

## 2023-09-19 LAB — RESP PANEL BY RT-PCR (RSV, FLU A&B, COVID)  RVPGX2
Influenza A by PCR: NEGATIVE
Influenza B by PCR: NEGATIVE
Resp Syncytial Virus by PCR: NEGATIVE
SARS Coronavirus 2 by RT PCR: NEGATIVE

## 2023-09-19 LAB — CBC WITH DIFFERENTIAL/PLATELET
Abs Immature Granulocytes: 0.03 10*3/uL (ref 0.00–0.07)
Basophils Absolute: 0 10*3/uL (ref 0.0–0.1)
Basophils Relative: 0 %
Eosinophils Absolute: 0 10*3/uL (ref 0.0–0.5)
Eosinophils Relative: 0 %
HCT: 31.4 % — ABNORMAL LOW (ref 39.0–52.0)
Hemoglobin: 10.2 g/dL — ABNORMAL LOW (ref 13.0–17.0)
Immature Granulocytes: 0 %
Lymphocytes Relative: 6 %
Lymphs Abs: 0.6 10*3/uL — ABNORMAL LOW (ref 0.7–4.0)
MCH: 36.3 pg — ABNORMAL HIGH (ref 26.0–34.0)
MCHC: 32.5 g/dL (ref 30.0–36.0)
MCV: 111.7 fL — ABNORMAL HIGH (ref 80.0–100.0)
Monocytes Absolute: 0.5 10*3/uL (ref 0.1–1.0)
Monocytes Relative: 5 %
Neutro Abs: 7.8 10*3/uL — ABNORMAL HIGH (ref 1.7–7.7)
Neutrophils Relative %: 89 %
Platelets: 126 10*3/uL — ABNORMAL LOW (ref 150–400)
RBC: 2.81 MIL/uL — ABNORMAL LOW (ref 4.22–5.81)
RDW: 18.6 % — ABNORMAL HIGH (ref 11.5–15.5)
WBC: 8.9 10*3/uL (ref 4.0–10.5)
nRBC: 0 % (ref 0.0–0.2)

## 2023-09-19 LAB — COMPREHENSIVE METABOLIC PANEL
ALT: 61 U/L — ABNORMAL HIGH (ref 0–44)
AST: 64 U/L — ABNORMAL HIGH (ref 15–41)
Albumin: 3.1 g/dL — ABNORMAL LOW (ref 3.5–5.0)
Alkaline Phosphatase: 104 U/L (ref 38–126)
Anion gap: 11 (ref 5–15)
BUN: 14 mg/dL (ref 8–23)
CO2: 20 mmol/L — ABNORMAL LOW (ref 22–32)
Calcium: 9.2 mg/dL (ref 8.9–10.3)
Chloride: 109 mmol/L (ref 98–111)
Creatinine, Ser: 0.98 mg/dL (ref 0.61–1.24)
GFR, Estimated: >60 mL/min (ref >60)
Glucose, Bld: 161 mg/dL — ABNORMAL HIGH (ref 70–99)
Potassium: 3.9 mmol/L (ref 3.5–5.1)
Sodium: 140 mmol/L (ref 135–145)
Total Bilirubin: 1.2 mg/dL (ref 0.0–1.2)
Total Protein: 6.6 g/dL (ref 6.5–8.1)

## 2023-09-19 LAB — URINALYSIS, ROUTINE W REFLEX MICROSCOPIC
Bilirubin Urine: NEGATIVE
Glucose, UA: NEGATIVE mg/dL
Hgb urine dipstick: NEGATIVE
Ketones, ur: 20 mg/dL — AB
Leukocytes,Ua: NEGATIVE
Nitrite: NEGATIVE
Protein, ur: NEGATIVE mg/dL
Specific Gravity, Urine: 1.015 (ref 1.005–1.030)
pH: 6 (ref 5.0–8.0)

## 2023-09-19 LAB — LACTIC ACID, PLASMA
Lactic Acid, Venous: 2.3 mmol/L (ref 0.5–1.9)
Lactic Acid, Venous: 1.8 mmol/L (ref 0.5–1.9)

## 2023-09-19 LAB — TROPONIN I (HIGH SENSITIVITY)
Troponin I (High Sensitivity): 28 ng/L — ABNORMAL HIGH (ref <18)
Troponin I (High Sensitivity): 35 ng/L — ABNORMAL HIGH (ref <18)

## 2023-09-19 MED ORDER — IOHEXOL 300 MG/ML  SOLN
100.0000 mL | Freq: Once | INTRAMUSCULAR | Status: AC | PRN
  Administered 2023-09-19: 100 mL via INTRAVENOUS

## 2023-09-19 MED ORDER — ACETAMINOPHEN 650 MG RE SUPP
650.0000 mg | Freq: Once | RECTAL | Status: AC
Start: 2023-09-19 — End: 2023-09-19
  Administered 2023-09-19: 650 mg via RECTAL
  Filled 2023-09-19: qty 1

## 2023-09-19 MED ORDER — LACTATED RINGERS IV BOLUS
1000.0000 mL | Freq: Once | INTRAVENOUS | Status: AC
  Administered 2023-09-19: 1000 mL via INTRAVENOUS

## 2023-09-19 MED ORDER — VANCOMYCIN HCL 2000 MG/400ML IV SOLN
2000.0000 mg | Freq: Once | INTRAVENOUS | Status: AC
  Administered 2023-09-19: 2000 mg via INTRAVENOUS
  Filled 2023-09-19: qty 400

## 2023-09-19 MED ORDER — SODIUM CHLORIDE 0.9 % IV SOLN
2.0000 g | Freq: Once | INTRAVENOUS | Status: AC
  Administered 2023-09-19: 2 g via INTRAVENOUS
  Filled 2023-09-19: qty 12.5

## 2023-09-19 MED ORDER — PANTOPRAZOLE SODIUM 40 MG IV SOLR
40.0000 mg | Freq: Once | INTRAVENOUS | Status: AC
  Administered 2023-09-19: 40 mg via INTRAVENOUS
  Filled 2023-09-19: qty 10

## 2023-09-19 NOTE — ED Triage Notes (Signed)
Per family states pt is having generalized weakness since 3am with fever.

## 2023-09-19 NOTE — Progress Notes (Signed)
ED Pharmacy Antibiotic Sign Off An antibiotic consult was received from an ED provider for vancomycin per pharmacy dosing for sepsis. A chart review was completed to assess appropriateness.   The following one time order(s) were placed:   -Cefepime 2g IV x1 per MD -Vancomycin 2g I x1  Further antibiotic and/or antibiotic pharmacy consults should be ordered by the admitting provider if indicated.   Thank you for allowing pharmacy to be a part of this patient's care.   Arabella Merles, [facility]  Clinical Pharmacist 09/19/23 10:49 PM

## 2023-09-19 NOTE — ED Provider Notes (Signed)
[place] EMERGENCY DEPARTMENT AT [facility] Provider Note  CSN: [id number] Arrival date & time: 09/19/23 1703  Chief Complaint(s) Weakness  HPI Tony Cantu is a 73 y.o. male with PMH aortic stenosis, osteoarthritis, BPH, cirrhosis, portal hypertension, T2DM, paroxysmal A-fib on Eliquis, known gastric ulcers and peptic ulcer disease and gastric varices, HTN, OSA on CPAP who presents emergency department for evaluation of fever and generalized weakness.  Wife states that patient has been decompensating over the last 3 days but over last 24 hours she was no longer able to get out of bed.  She states that he has been more confused and is having trouble answering questions which is not his baseline.  Does have a previous medical history of multiple hospital admissions for sepsis and bacteremia.  Patient febrile at home and brought to emergency room for further evaluation.  He currently denies any chest pain, shortness of breath, abdominal pain, nausea, vomiting, headache or other systemic symptoms.  Endorsing generalized myalgias.   Past Medical History Past Medical History:  Diagnosis Date   Aortic atherosclerosis (HCC)    Aortic stenosis    Arthritis    Asthma    BPH (benign prostatic hypertrophy)    Cirrhosis (HCC) 2016   Colon polyps    Diabetes mellitus without complication (HCC)    TYPE 1    Diverticulosis    Dysrhythmia 04/19/2021   A-fib noted with possible A-fib RVR   Gastric ulcer    Gastric varices    right   Gastritis    GERD (gastroesophageal reflux disease)    Headache    History- pt states these were migraines that occurred in the 1980's   Heart murmur    History of kidney stones    Hx of adenomatous colonic polyps    Hypertension    Iron deficiency anemia due to chronic blood loss 08/16/2023   PONV (postoperative nausea and vomiting)    pt states he has never had post op nausea or vomiting   Portal hypertensive gastropathy (HCC)    Renal cyst,  right    Seizures (HCC)    HYPOGLYCEMIC LAST 1 AND 1/2 YRS AGO   Sleep apnea    uses CPAP nightly   Testicle trouble    one testicle BORN WITH   Tubular adenoma of colon    Patient Active Problem List   Diagnosis Date Noted   Iron deficiency anemia due to chronic blood loss 08/16/2023   NAFLD (nonalcoholic fatty liver disease) 53/66/4403   Portal hypertension (HCC) 08/24/2022   S/P total knee arthroplasty, left 06/06/2022   Degenerative arthritis of left knee 06/03/2022   Chronic obstructive pulmonary disease (HCC) 05/10/2022   Grade I diastolic dysfunction 03/11/2022   Postoperative fever 03/10/2022   S/P TKR (total knee replacement), right 03/07/2022   Osteoarthritis of right knee 03/04/2022   Precordial chest pain 04/20/2021   Former smoker 01/12/2021   PAF (paroxysmal atrial fibrillation) (HCC) 06/22/2020   Hypertension associated with type 2 diabetes mellitus (HCC) 05/17/2019   Hyperlipidemia associated with type 2 diabetes mellitus (HCC) 05/17/2019   GERD with esophagitis 05/17/2019   H/O total shoulder replacement, right 11/01/2018   Paroxysmal tachycardia (HCC) 01/11/2017   Nonrheumatic aortic valve stenosis 01/11/2017   Aortic atherosclerosis (HCC) 11/23/2016   HNP (herniated nucleus pulposus), lumbar 10/06/2016   Acute renal injury (HCC) 08/06/2016   Diarrhea 08/06/2016   Fever 08/06/2016   Hyperbilirubinemia 08/06/2016   Lactic acidosis 08/06/2016   Inguinal hernia 12/10/2015  Right groin pain 11/30/2015   Carpal tunnel syndrome on right 09/09/2015   Cervical spondylosis without myelopathy 09/09/2015   Thrombocytopenia (HCC) 07/21/2014   Bilateral carotid bruits 05/11/2014   Vitamin D deficiency 09/17/2013   BPH (benign prostatic hyperplasia) 05/22/2013   Low serum testosterone level 02/18/2013   Diabetes type 2, controlled (HCC) 01/10/2013   OSA (obstructive sleep apnea) 05/16/2012   Edema 02/21/2012   At risk for coronary artery disease 03/20/2011    Obesity 03/20/2011   Allergic rhinitis 06/22/2010   ASTHMA 06/22/2010   COUGH 06/22/2010   Home Medication(s) Prior to Admission medications   Medication Sig Start Date End Date Taking? Authorizing Provider  acetaminophen (TYLENOL) 500 MG tablet Take 1,000 mg by mouth every 6 (six) hours as needed for moderate pain.   Yes [provider]  apixaban (ELIQUIS) 5 MG TABS tablet Take 1 tablet (5 mg total) by mouth 2 (two) times daily. 06/08/23  Yes Gottschalk, Kathie Rhodes M, DO  atorvastatin (LIPITOR) 40 MG tablet Take 1 tablet (40 mg total) by mouth daily. 06/08/23  Yes Delynn Flavin M, DO  b complex vitamins tablet Take 1 tablet by mouth daily.   Yes [provider]  Cholecalciferol (DIALYVITE VITAMIN D 5000) 125 MCG (5000 UT) capsule Take 5,000 Units by mouth daily.   Yes [provider]  Chromium Picolinate (CHROMIUM PICOLATE PO) Take 1 tablet by mouth daily.   Yes [provider]  CINNAMON PO Take 1,080 mg by mouth daily.   Yes [provider]  Coenzyme Q10 (COQ10) 100 MG CAPS Take 100 mg by mouth daily.   Yes [provider]  diltiazem (CARDIZEM) 30 MG tablet Take 1 tablet (30 mg total) by mouth 2 (two) times daily. Patient must schedule annual office visit for further refills 06/26/23  Yes Rollene Rotunda, MD  ferrous sulfate 325 (65 FE) MG EC tablet Take 1 tablet (325 mg total) by mouth daily with breakfast. 01/18/23  Yes Pennington, Rebekah M, PA-C  furosemide (LASIX) 20 MG tablet Take 1.5 tablets (30 mg total) by mouth daily. 06/08/23  Yes Gottschalk, Ashly M, DO  Glucagon, rDNA, (GLUCAGON EMERGENCY) 1 MG KIT INJECT AS DIRECTED INTO  UPPER ARM, THIGH OR  BUTTOCKS AS NEEDED FOR  SEVERE HYPOGLYCEMIA. SEEK  MEDICAL ATTENTION AFTER USE Patient taking differently: Inject 1 mg into the muscle as needed (HYPOGLYCEMIA). 06/14/21  Yes Deliah Boston F, FNP  Glucosamine-Chondroit-Vit C-Mn (GLUCOSAMINE 1500 COMPLEX) CAPS Take 2 capsules by mouth  daily.   Yes [provider]  insulin glargine, 2 Unit Dial, (TOUJEO MAX SOLOSTAR) 300 UNIT/ML Solostar Pen Inject 20-50 Units into the skin daily. 06/08/23  Yes Gottschalk, Ashly M, DO  insulin lispro (HUMALOG) 100 UNIT/ML injection Inject 0.2-0.3 mLs (20-30 Units total) into the skin 3 (three) times daily with meals. And per sliding scale.  ** dose change Patient taking differently: Inject 20-30 Units into the skin as needed for high blood sugar. And per sliding scale.  ** dose change Up to 6 to 8 times daily 06/08/23  Yes Delynn Flavin M, DO  Magnesium 250 MG TABS Take 250 mg by mouth daily.   Yes [provider]  montelukast (SINGULAIR) 10 MG tablet Take 1 tablet (10 mg total) by mouth at bedtime. 06/08/23  Yes Gottschalk, Kathie Rhodes M, DO  Multiple Vitamins-Minerals (EYE HEALTH) CAPS Take 1 capsule by mouth daily.   Yes [provider]  pantoprazole (PROTONIX) 40 MG tablet TAKE 1 TABLET BY MOUTH TWICE  DAILY BEFORE  MEALS 06/14/23  Yes Pyrtle, Carie Caddy, MD  potassium chloride SA (KLOR-CON M) 20 MEQ tablet Take 1 tablet (20 mEq total) by mouth daily. 06/08/23  Yes Gottschalk, Ashly M, DO  ramelteon (ROZEREM) 8 MG tablet Take 1 tablet (8 mg total) by mouth at bedtime. 06/08/23  Yes Delynn Flavin M, DO  Testosterone 30 MG/ACT SOLN Apply 1 pump under each axilla once daily. 07/21/23  Yes Gottschalk, Kathie Rhodes M, DO  Turmeric Curcumin 500 MG CAPS Take 500 mg by mouth daily.   Yes [provider]  valsartan (DIOVAN) 40 MG tablet Take 1 tablet (40 mg total) by mouth daily. 06/08/23  Yes Gottschalk, Kathie Rhodes M, DO  Blood Glucose Monitoring Suppl (CONTOUR NEXT ONE) KIT Test BS QID and as needed Dx E11.9 06/04/20   Delynn Flavin M, DO  Continuous Blood Gluc Sensor (FREESTYLE LIBRE SENSOR SYSTEM) MISC Check BS eight (8) times a day. Dx E10.9 06/29/22   Delynn Flavin M, DO  glucose blood (CONTOUR NEXT TEST) test strip Test BS QID and as needed Dx E11.9 11/08/21   [name], DO  Microlet Lancets MISC Test BS QID and as needed Dx E11.9 06/04/20   [name], DO                                                                                                                                    Past Surgical History Past Surgical History:  Procedure Laterality Date   BACK SURGERY     89  LOWER    CARPAL TUNNEL RELEASE Right 10/13/2015   Procedure: RIGHT CARPAL TUNNEL RELEASE;  Surgeon: Cindee Salt, MD;  Location: [facility];  Service: Orthopedics;  Laterality: Right;   CARPAL TUNNEL RELEASE Left 07/05/2016   Procedure: LEFT CARPAL TUNNEL RELEASE;  Surgeon: Cindee Salt, MD;  Location:  SURGERY CENTER;  Service: Orthopedics;  Laterality: Left;   COLONOSCOPY WITH ESOPHAGOGASTRODUODENOSCOPY (EGD)  10/05/2022   DRUG INDUCED ENDOSCOPY N/A 07/02/2021   Procedure: DRUG INDUCED SLEEP ENDOSCOPY;  Surgeon: Christia Reading, MD;  Location: [facility] OR;  Service: ENT;  Laterality: N/A;   IMPLANTATION OF HYPOGLOSSAL NERVE STIMULATOR Right 10/01/2021   Procedure: IMPLANTATION OF HYPOGLOSSAL NERVE STIMULATOR;  Surgeon: Christia Reading, MD;  Location: [facility] OR;  Service: ENT;  Laterality: Right;   INGUINAL HERNIA REPAIR  2003   right    KNEE ARTHROSCOPY Right 03/03/2016   LUMBAR DISC SURGERY  03/1995   Dr. Daleen Squibb, discectomy   LUMBAR LAMINECTOMY/DECOMPRESSION MICRODISCECTOMY Right 10/06/2016   Procedure: RIGHT LUMBAR THREE - LUMBAR FOUR  LAMINECTOMY, FORAMINOTOMY AND MICRODISCECTOMY;  Surgeon: Shirlean Kelly, MD;  Location: [facility] OR;  Service: Neurosurgery;  Laterality: Right;   SHOULDER SURGERY  11/28/2005   left partial   SHOULDER SURGERY  07/14/2006   RIGHT   TONSILLECTOMY  AGE 73 OR 4   TOTAL KNEE ARTHROPLASTY Right 03/07/2022   Procedure: RIGHT TOTAL KNEE ARTHROPLASTY;  Surgeon: Gean Birchwood, MD;  Location: [facility];  Service: Orthopedics;  Laterality: Right;   TOTAL KNEE ARTHROPLASTY Left 06/06/2022   Procedure: LEFT TOTAL KNEE ARTHROPLASTY;   Surgeon: Gean Birchwood, MD;  Location: [facility];  Service: Orthopedics;  Laterality: Left;   TOTAL SHOULDER ARTHROPLASTY Right 11/01/2018   Procedure: RIGHT SHOULDER REVISION TO REVERSE TOTAL SHOULDER;  Surgeon: Jones Broom, MD;  Location: [facility];  Service: Orthopedics;  Laterality: Right;  CHOICE ANESTHESIA WITH INTERSCALENE BLOCK EXPAREL, NEEDS RNFA   Family History Family History  Problem Relation Age of Onset   Heart failure Mother    Heart attack Mother        in her 51's   Alzheimer's disease Mother    Diabetes Mother    Asthma Father    Suicidality Father        in his 57's   Allergic rhinitis Sister    Heart failure Maternal Grandmother    Rheum arthritis Maternal Grandmother    Breast cancer Maternal Grandmother    Heart attack Maternal Grandmother        in her 34's   Colon cancer Neg Hx    Esophageal cancer Neg Hx    Pancreatic cancer Neg Hx    Liver disease Neg Hx     Social History Social History   Tobacco Use   Smoking status: Former    Current packs/day: 0.00    Types: Cigarettes, Pipe    Start date: 59    Quit date: 2005    Years since quitting: 20.0   Smokeless tobacco: Never   Tobacco comments:    quit 2005 smoked cigarettes for 5 yrs prior to pipe use  Vaping Use   Vaping status: Never Used  Substance Use Topics   Alcohol use: Not Currently   Drug use: No   Allergies Dimetapp children's cold-cough, Erythromycin, and Sulfonamide derivatives  Review of Systems Review of Systems  Constitutional:  Positive for chills and fever.  Musculoskeletal:  Positive for arthralgias and myalgias.    Physical Exam Vital Signs  I have reviewed the triage vital signs BP 115/61   Pulse (!) 106   Temp (!) 103.5 F (39.7 C) (Rectal)   Resp (!) 27   Ht 5\' 8"  (1.727 m)   Wt 95 kg   SpO2 93%   BMI 31.84 kg/m   Physical Exam Constitutional:      General: He is not in acute distress.    Appearance: Normal appearance. He is ill-appearing.  HENT:      Head: Normocephalic and atraumatic.     Nose: No congestion or rhinorrhea.  Eyes:     General:        Right eye: No discharge.        Left eye: No discharge.     Extraocular Movements: Extraocular movements intact.     Pupils: Pupils are equal, round, and reactive to light.  Cardiovascular:     Rate and Rhythm: Regular rhythm. Tachycardia present.     Heart sounds: No murmur heard. Pulmonary:     Effort: No respiratory distress.     Breath sounds: No wheezing or rales.  Abdominal:     General: There is no distension.     Tenderness: There is no abdominal tenderness.  Musculoskeletal:        General: Normal range of motion.     Cervical back: Normal range of motion.  Skin:    General: Skin is warm and dry.  Neurological:  General: No focal deficit present.     Mental Status: He is alert.     ED Results and Treatments Labs (all labs ordered are listed, but only abnormal results are displayed) Labs Reviewed  COMPREHENSIVE METABOLIC PANEL - Abnormal; Notable for the following components:      Result Value   CO2 20 (*)    Glucose, Bld 161 (*)    Albumin 3.1 (*)    AST 64 (*)    ALT 61 (*)    All other components within normal limits  CBC WITH DIFFERENTIAL/PLATELET - Abnormal; Notable for the following components:   RBC 2.81 (*)    Hemoglobin 10.2 (*)    HCT 31.4 (*)    MCV 111.7 (*)    MCH 36.3 (*)    RDW 18.6 (*)    Platelets 126 (*)    Neutro Abs 7.8 (*)    Lymphs Abs 0.6 (*)    All other components within normal limits  URINALYSIS, ROUTINE W REFLEX MICROSCOPIC - Abnormal; Notable for the following components:   Ketones, ur 20 (*)    All other components within normal limits  LACTIC ACID, PLASMA - Abnormal; Notable for the following components:   Lactic Acid, Venous 2.3 (*)    All other components within normal limits  TROPONIN I (HIGH SENSITIVITY) - Abnormal; Notable for the following components:   Troponin I (High Sensitivity) 28 (*)    All other  components within normal limits  TROPONIN I (HIGH SENSITIVITY) - Abnormal; Notable for the following components:   Troponin I (High Sensitivity) 35 (*)    All other components within normal limits  RESP PANEL BY RT-PCR (RSV, FLU A&B, COVID)  RVPGX2  CULTURE, BLOOD (ROUTINE X 2)  CULTURE, BLOOD (ROUTINE X 2)  GASTROINTESTINAL PANEL BY PCR, STOOL (REPLACES STOOL CULTURE)  C DIFFICILE QUICK SCREEN W PCR REFLEX    LACTIC ACID, PLASMA                                                                                                                          Radiology CT CHEST ABDOMEN PELVIS W CONTRAST Result Date: 09/19/2023 CLINICAL DATA:  Sepsis fever EXAM: CT CHEST, ABDOMEN, AND PELVIS WITH CONTRAST TECHNIQUE: Multidetector CT imaging of the chest, abdomen and pelvis was performed following the standard protocol during bolus administration of intravenous contrast. RADIATION DOSE REDUCTION: This exam was performed according to the departmental dose-optimization program which includes automated exposure control, adjustment of the mA and/or kV according to patient size and/or use of iterative reconstruction technique. CONTRAST:  OMNIPAQUE IOHEXOL 300 MG/ML  SOLN COMPARISON:  Chest x-ray 09/19/2023, CT 04/28/2021, 02/20/2019 FINDINGS: CT CHEST FINDINGS Cardiovascular: Mild aortic atherosclerosis. No aneurysm. Mitral annular calcification. Aortic valvular calcification. Borderline cardiomegaly. No pericardial effusion Mediastinum/Nodes: Patent trachea. No thyroid mass. No suspicious lymph nodes. Esophagus within normal limits Lungs/Pleura: No acute airspace disease, pleural effusion or pneumothorax. Musculoskeletal: Sternum appears intact. There are degenerative changes of the spine.  No acute osseous abnormality CT ABDOMEN PELVIS FINDINGS Hepatobiliary: Liver cirrhosis. No calcified gallstone or biliary dilatation Pancreas: Poorly visible, presumably due to marked atrophy. No inflammation. Spleen: Normal in  size without focal abnormality. Adrenals/Urinary Tract: Adrenal glands are normal. Kidneys show no hydronephrosis. Renal cysts for which no imaging follow-up is recommended. Mild left greater than right perinephric stranding. Possible areas of cortical hypoenhancement involving the kidneys on delayed views but limited by artifact from the patient's arms. There is also motion degradation. The bladder is unremarkable Stomach/Bowel: The stomach is nonenlarged. There is no dilated small bowel. No acute bowel [place] thickening. Diverticular disease of the colon. Vascular/Lymphatic: Mild aortic atherosclerosis. No aneurysm. No suspicious lymph nodes. Gastroesophageal varices. Collateral vessels in the left upper quadrant. Reproductive: Prostate is unremarkable. Other: Negative for pelvic effusion or free air. Mild skin thickening of the right anterior abdominal [place] Musculoskeletal: Degenerative changes. No acute or suspicious osseous abnormality. IMPRESSION: 1. No CT evidence for acute intrathoracic abnormality. 2. Liver cirrhosis with evidence of portal hypertension including gastroesophageal varices. 3. Diverticular disease of the colon without acute inflammatory process. 4. Mild left greater than right perinephric stranding with possible areas of cortical hypoenhancement involving the kidneys on delayed views but limited by artifact from the patient's arms and motion degradation. Correlate with urinalysis to assess for UTI/pyelonephritis Aortic Atherosclerosis (ICD10-I70.0). Electronically Signed   By: Jasmine Pang M.D.   On: 09/19/2023 21:24   DG Chest Portable 1 View Result Date: 09/19/2023 CLINICAL DATA:  Generalized weakness and fever today. EXAM: PORTABLE CHEST 1 VIEW COMPARISON:  03/10/2022 FINDINGS: The cardiac silhouette remains mildly enlarged. Decreased prominence of the pulmonary vasculature. Mildly prominent interstitial markings with an appearance compatible with chronic interstitial lung disease  without superimposed pulmonary edema. No airspace consolidation or pleural fluid. Left shoulder prosthesis. Lower thoracic and upper lumbar spine degenerative changes. IMPRESSION: 1. No acute abnormality. 2. Mild cardiomegaly and mild chronic interstitial lung disease. Electronically Signed   By: Beckie Salts M.D.   On: 09/19/2023 18:24    Pertinent labs & imaging results that were available during my care of the patient were reviewed by me and considered in my medical decision making (see MDM for details).  Medications Ordered in ED Medications  ceFEPIme (MAXIPIME) 2 g in sodium chloride 0.9 % 100 mL IVPB (has no administration in time range)  vancomycin (VANCOREADY) IVPB 2000 mg/400 mL (has no administration in time range)  pantoprazole (PROTONIX) injection 40 mg (has no administration in time range)  lactated ringers bolus 1,000 mL (1,000 mLs Intravenous New Bag/Given 09/19/23 1956)  acetaminophen (TYLENOL) suppository 650 mg (650 mg Rectal Given 09/19/23 2044)  iohexol (OMNIPAQUE) 300 MG/ML solution 100 mL (100 mLs Intravenous Contrast Given 09/19/23 2052)  Procedures .Critical Care  Performed by: Glendora Score, MD Authorized by: Glendora Score, MD   Critical care provider statement:    Critical care time (minutes):  30   Critical care was necessary to treat or prevent imminent or life-threatening deterioration of the following conditions:  Sepsis   Critical care was time spent personally by me on the following activities:  Development of treatment plan with patient or surrogate, discussions with consultants, evaluation of patient's response to treatment, examination of patient, ordering and review of laboratory studies, ordering and review of radiographic studies, ordering and performing treatments and interventions, pulse oximetry, re-evaluation of patient's  condition and review of old charts   (including critical care time)  Medical Decision Making / ED Course   This patient presents to the ED for concern of fever, myalgias, this involves an extensive number of treatment options, and is a complaint that carries with it a high risk of complications and morbidity.  The differential diagnosis includes sepsis, bacteremia, UTI, pneumonia, GI bleed, electrolyte abnormality, upper respiratory infection, COVID-19, influenza, RSV, dehydration  MDM: Patient seen emergency room for evaluation of fever and myalgias.  Physical exam largely unremarkable outside of a regular tachycardia, but patient does appear confused.  Laboratory evaluation with no leukocytosis but hemoglobin 10.2 downtrending from previous, MCV 111.7.  AST 64, ALT 61.  Urinalysis unremarkable.  COVID, flu, RSV negative.  Patient then became acutely febrile in the emergency department to 103.5 and imaging expanded, blood cultures obtained and lactic ordered.  Initial lactic 2.3 and fluid resuscitation begun.  Broad-spectrum antibiotics ordered.  Imaging with no clear source of sepsis showing diverticular disease of the colon and a mild left greater than right perinephric stranding.  However urinalysis is normal.  Patient had an episode of black diarrhea that was Hemoccult positive and PPI started.  GI consulted.  Patient remains confused and in the setting of a history of previous bacteremia, persistent fevers and confusion he will require hospital admission for further workup.   Additional history obtained: -Additional history obtained from wife -External records from outside source obtained and reviewed including: Chart review including previous notes, labs, imaging, consultation notes   Lab Tests: -I ordered, reviewed, and interpreted labs.   The pertinent results include:   Labs Reviewed  COMPREHENSIVE METABOLIC PANEL - Abnormal; Notable for the following components:      Result Value    CO2 20 (*)    Glucose, Bld 161 (*)    Albumin 3.1 (*)    AST 64 (*)    ALT 61 (*)    All other components within normal limits  CBC WITH DIFFERENTIAL/PLATELET - Abnormal; Notable for the following components:   RBC 2.81 (*)    Hemoglobin 10.2 (*)    HCT 31.4 (*)    MCV 111.7 (*)    MCH 36.3 (*)    RDW 18.6 (*)    Platelets 126 (*)    Neutro Abs 7.8 (*)    Lymphs Abs 0.6 (*)    All other components within normal limits  URINALYSIS, ROUTINE W REFLEX MICROSCOPIC - Abnormal; Notable for the following components:   Ketones, ur 20 (*)    All other components within normal limits  LACTIC ACID, PLASMA - Abnormal; Notable for the following components:   Lactic Acid, Venous 2.3 (*)    All other components within normal limits  TROPONIN I (HIGH SENSITIVITY) - Abnormal; Notable for the following components:   Troponin I (High Sensitivity) 28 (*)  All other components within normal limits  TROPONIN I (HIGH SENSITIVITY) - Abnormal; Notable for the following components:   Troponin I (High Sensitivity) 35 (*)    All other components within normal limits  RESP PANEL BY RT-PCR (RSV, FLU A&B, COVID)  RVPGX2  CULTURE, BLOOD (ROUTINE X 2)  CULTURE, BLOOD (ROUTINE X 2)  GASTROINTESTINAL PANEL BY PCR, STOOL (REPLACES STOOL CULTURE)  C DIFFICILE QUICK SCREEN W PCR REFLEX    LACTIC ACID, PLASMA      Imaging Studies ordered: I ordered imaging studies including chest x-ray, CT chest abdomen pelvis I independently visualized and interpreted imaging. I agree with the radiologist interpretation   Medicines ordered and prescription drug management: Meds ordered this encounter  Medications   lactated ringers bolus 1,000 mL   acetaminophen (TYLENOL) suppository 650 mg   iohexol (OMNIPAQUE) 300 MG/ML solution 100 mL   ceFEPIme (MAXIPIME) 2 g in sodium chloride 0.9 % 100 mL IVPB   vancomycin (VANCOREADY) IVPB 2000 mg/400 mL    Indication::   Sepsis   pantoprazole (PROTONIX) injection 40 mg     -I have reviewed the patients home medicines and have made adjustments as needed  Critical interventions Fluids, antibiotics    Cardiac Monitoring: The patient was maintained on a cardiac monitor.  I personally viewed and interpreted the cardiac monitored which showed an underlying rhythm of: sinus tachycardia  Social Determinants of Health:  Factors impacting patients care include: none   Reevaluation: After the interventions noted above, I reevaluated the patient and found that they have :stayed the same  Co morbidities that complicate the patient evaluation  Past Medical History:  Diagnosis Date   Aortic atherosclerosis (HCC)    Aortic stenosis    Arthritis    Asthma    BPH (benign prostatic hypertrophy)    Cirrhosis (HCC) 2016   Colon polyps    Diabetes mellitus without complication (HCC)    TYPE 1    Diverticulosis    Dysrhythmia 04/19/2021   A-fib noted with possible A-fib RVR   Gastric ulcer    Gastric varices    right   Gastritis    GERD (gastroesophageal reflux disease)    Headache    History- pt states these were migraines that occurred in the 1980's   Heart murmur    History of kidney stones    Hx of adenomatous colonic polyps    Hypertension    Iron deficiency anemia due to chronic blood loss 08/16/2023   PONV (postoperative nausea and vomiting)    pt states he has never had post op nausea or vomiting   Portal hypertensive gastropathy (HCC)    Renal cyst, right    Seizures (HCC)    HYPOGLYCEMIC LAST 1 AND 1/2 YRS AGO   Sleep apnea    uses CPAP nightly   Testicle trouble    one testicle BORN WITH   Tubular adenoma of colon       Dispostion: I considered admission for this patient, and due to persistent fever and confusion patient require hospital admission     Final Clinical Impression(s) / ED Diagnoses Final diagnoses:  None     @PCDICTATION @    Glendora Score, MD 09/19/23 2203

## 2023-09-19 NOTE — ED Notes (Signed)
Iv attempted x 2. 

## 2023-09-20 ENCOUNTER — Inpatient Hospital Stay (HOSPITAL_COMMUNITY): Payer: BC Managed Care – PPO

## 2023-09-20 ENCOUNTER — Inpatient Hospital Stay (HOSPITAL_COMMUNITY): Payer: Medicare Other

## 2023-09-20 ENCOUNTER — Other Ambulatory Visit (HOSPITAL_COMMUNITY): Payer: Self-pay | Admitting: *Deleted

## 2023-09-20 ENCOUNTER — Telehealth: Payer: Self-pay | Admitting: Family Medicine

## 2023-09-20 DIAGNOSIS — I480 Paroxysmal atrial fibrillation: Secondary | ICD-10-CM | POA: Diagnosis present

## 2023-09-20 DIAGNOSIS — K7469 Other cirrhosis of liver: Secondary | ICD-10-CM | POA: Diagnosis not present

## 2023-09-20 DIAGNOSIS — R1319 Other dysphagia: Secondary | ICD-10-CM | POA: Diagnosis not present

## 2023-09-20 DIAGNOSIS — I76 Septic arterial embolism: Secondary | ICD-10-CM | POA: Diagnosis present

## 2023-09-20 DIAGNOSIS — R509 Fever, unspecified: Secondary | ICD-10-CM | POA: Diagnosis not present

## 2023-09-20 DIAGNOSIS — A419 Sepsis, unspecified organism: Secondary | ICD-10-CM | POA: Diagnosis present

## 2023-09-20 DIAGNOSIS — N289 Disorder of kidney and ureter, unspecified: Secondary | ICD-10-CM

## 2023-09-20 DIAGNOSIS — I4729 Other ventricular tachycardia: Secondary | ICD-10-CM | POA: Diagnosis not present

## 2023-09-20 DIAGNOSIS — G8191 Hemiplegia, unspecified affecting right dominant side: Secondary | ICD-10-CM | POA: Diagnosis not present

## 2023-09-20 DIAGNOSIS — R278 Other lack of coordination: Secondary | ICD-10-CM | POA: Diagnosis not present

## 2023-09-20 DIAGNOSIS — R2990 Unspecified symptoms and signs involving the nervous system: Secondary | ICD-10-CM

## 2023-09-20 DIAGNOSIS — K7460 Unspecified cirrhosis of liver: Secondary | ICD-10-CM | POA: Diagnosis not present

## 2023-09-20 DIAGNOSIS — G9341 Metabolic encephalopathy: Secondary | ICD-10-CM

## 2023-09-20 DIAGNOSIS — G9340 Encephalopathy, unspecified: Secondary | ICD-10-CM

## 2023-09-20 DIAGNOSIS — R6520 Severe sepsis without septic shock: Secondary | ICD-10-CM

## 2023-09-20 DIAGNOSIS — M1711 Unilateral primary osteoarthritis, right knee: Secondary | ICD-10-CM | POA: Diagnosis not present

## 2023-09-20 DIAGNOSIS — I472 Ventricular tachycardia, unspecified: Secondary | ICD-10-CM | POA: Diagnosis present

## 2023-09-20 DIAGNOSIS — E109 Type 1 diabetes mellitus without complications: Secondary | ICD-10-CM | POA: Diagnosis present

## 2023-09-20 DIAGNOSIS — I5A Non-ischemic myocardial injury (non-traumatic): Secondary | ICD-10-CM | POA: Diagnosis present

## 2023-09-20 DIAGNOSIS — B955 Unspecified streptococcus as the cause of diseases classified elsewhere: Secondary | ICD-10-CM | POA: Diagnosis not present

## 2023-09-20 DIAGNOSIS — I493 Ventricular premature depolarization: Secondary | ICD-10-CM | POA: Diagnosis not present

## 2023-09-20 DIAGNOSIS — I8510 Secondary esophageal varices without bleeding: Secondary | ICD-10-CM | POA: Diagnosis present

## 2023-09-20 DIAGNOSIS — Z8719 Personal history of other diseases of the digestive system: Secondary | ICD-10-CM | POA: Diagnosis not present

## 2023-09-20 DIAGNOSIS — R262 Difficulty in walking, not elsewhere classified: Secondary | ICD-10-CM | POA: Diagnosis not present

## 2023-09-20 DIAGNOSIS — K3189 Other diseases of stomach and duodenum: Secondary | ICD-10-CM | POA: Diagnosis not present

## 2023-09-20 DIAGNOSIS — R410 Disorientation, unspecified: Secondary | ICD-10-CM | POA: Diagnosis not present

## 2023-09-20 DIAGNOSIS — G40909 Epilepsy, unspecified, not intractable, without status epilepticus: Secondary | ICD-10-CM | POA: Diagnosis present

## 2023-09-20 DIAGNOSIS — R4182 Altered mental status, unspecified: Secondary | ICD-10-CM

## 2023-09-20 DIAGNOSIS — K921 Melena: Secondary | ICD-10-CM | POA: Diagnosis not present

## 2023-09-20 DIAGNOSIS — Z1152 Encounter for screening for COVID-19: Secondary | ICD-10-CM | POA: Diagnosis not present

## 2023-09-20 DIAGNOSIS — R7401 Elevation of levels of liver transaminase levels: Secondary | ICD-10-CM | POA: Diagnosis not present

## 2023-09-20 DIAGNOSIS — Z1611 Resistance to penicillins: Secondary | ICD-10-CM | POA: Diagnosis present

## 2023-09-20 DIAGNOSIS — I272 Pulmonary hypertension, unspecified: Secondary | ICD-10-CM | POA: Diagnosis present

## 2023-09-20 DIAGNOSIS — D696 Thrombocytopenia, unspecified: Secondary | ICD-10-CM | POA: Diagnosis present

## 2023-09-20 DIAGNOSIS — R7881 Bacteremia: Secondary | ICD-10-CM | POA: Diagnosis not present

## 2023-09-20 DIAGNOSIS — R933 Abnormal findings on diagnostic imaging of other parts of digestive tract: Secondary | ICD-10-CM | POA: Diagnosis not present

## 2023-09-20 DIAGNOSIS — I340 Nonrheumatic mitral (valve) insufficiency: Secondary | ICD-10-CM | POA: Diagnosis not present

## 2023-09-20 DIAGNOSIS — K31811 Angiodysplasia of stomach and duodenum with bleeding: Secondary | ICD-10-CM | POA: Diagnosis present

## 2023-09-20 DIAGNOSIS — Z794 Long term (current) use of insulin: Secondary | ICD-10-CM | POA: Diagnosis not present

## 2023-09-20 DIAGNOSIS — K7581 Nonalcoholic steatohepatitis (NASH): Secondary | ICD-10-CM

## 2023-09-20 DIAGNOSIS — D649 Anemia, unspecified: Secondary | ICD-10-CM | POA: Diagnosis not present

## 2023-09-20 DIAGNOSIS — K31819 Angiodysplasia of stomach and duodenum without bleeding: Secondary | ICD-10-CM | POA: Diagnosis not present

## 2023-09-20 DIAGNOSIS — K766 Portal hypertension: Secondary | ICD-10-CM | POA: Diagnosis present

## 2023-09-20 DIAGNOSIS — Z7401 Bed confinement status: Secondary | ICD-10-CM | POA: Diagnosis not present

## 2023-09-20 DIAGNOSIS — B3781 Candidal esophagitis: Secondary | ICD-10-CM | POA: Diagnosis present

## 2023-09-20 DIAGNOSIS — I350 Nonrheumatic aortic (valve) stenosis: Secondary | ICD-10-CM | POA: Diagnosis not present

## 2023-09-20 DIAGNOSIS — I10 Essential (primary) hypertension: Secondary | ICD-10-CM | POA: Diagnosis present

## 2023-09-20 DIAGNOSIS — R531 Weakness: Secondary | ICD-10-CM | POA: Diagnosis not present

## 2023-09-20 DIAGNOSIS — A409 Streptococcal sepsis, unspecified: Secondary | ICD-10-CM | POA: Diagnosis present

## 2023-09-20 DIAGNOSIS — E66811 Obesity, class 1: Secondary | ICD-10-CM | POA: Diagnosis present

## 2023-09-20 DIAGNOSIS — J449 Chronic obstructive pulmonary disease, unspecified: Secondary | ICD-10-CM | POA: Diagnosis not present

## 2023-09-20 DIAGNOSIS — I8500 Esophageal varices without bleeding: Secondary | ICD-10-CM | POA: Diagnosis not present

## 2023-09-20 DIAGNOSIS — D62 Acute posthemorrhagic anemia: Secondary | ICD-10-CM | POA: Diagnosis present

## 2023-09-20 DIAGNOSIS — D500 Iron deficiency anemia secondary to blood loss (chronic): Secondary | ICD-10-CM | POA: Diagnosis not present

## 2023-09-20 DIAGNOSIS — I083 Combined rheumatic disorders of mitral, aortic and tricuspid valves: Secondary | ICD-10-CM | POA: Diagnosis present

## 2023-09-20 DIAGNOSIS — M6281 Muscle weakness (generalized): Secondary | ICD-10-CM | POA: Diagnosis not present

## 2023-09-20 DIAGNOSIS — I700 Atherosclerosis of aorta: Secondary | ICD-10-CM | POA: Diagnosis present

## 2023-09-20 DIAGNOSIS — D509 Iron deficiency anemia, unspecified: Secondary | ICD-10-CM | POA: Diagnosis present

## 2023-09-20 HISTORY — DX: Unspecified symptoms and signs involving the nervous system: R29.90

## 2023-09-20 HISTORY — DX: Sepsis, unspecified organism: A41.9

## 2023-09-20 HISTORY — DX: Melena: K92.1

## 2023-09-20 LAB — RESPIRATORY PANEL BY PCR

## 2023-09-20 LAB — BLOOD GAS, VENOUS
Acid-base deficit: 2.5 mmol/L — ABNORMAL HIGH (ref 0.0–2.0)
Bicarbonate: 21.7 mmol/L (ref 20.0–28.0)
Drawn by: 53361
O2 Saturation: 61.6 %
Patient temperature: 38.6
pCO2, Ven: 38 mm[Hg] — ABNORMAL LOW (ref 44–60)
pH, Ven: 7.38 (ref 7.25–7.43)
pO2, Ven: 40 mm[Hg] (ref 32–45)

## 2023-09-20 LAB — BLOOD CULTURE ID PANEL (REFLEXED) - BCID2

## 2023-09-20 LAB — HEPATIC FUNCTION PANEL
ALT: 54 U/L — ABNORMAL HIGH (ref 0–44)
AST: 65 U/L — ABNORMAL HIGH (ref 15–41)
Albumin: 2.7 g/dL — ABNORMAL LOW (ref 3.5–5.0)
Alkaline Phosphatase: 80 U/L (ref 38–126)
Bilirubin, Direct: 0.5 mg/dL — ABNORMAL HIGH (ref 0.0–0.2)
Indirect Bilirubin: 1.1 mg/dL — ABNORMAL HIGH (ref 0.3–0.9)
Total Bilirubin: 1.6 mg/dL — ABNORMAL HIGH (ref 0.0–1.2)
Total Protein: 6.1 g/dL — ABNORMAL LOW (ref 6.5–8.1)

## 2023-09-20 LAB — ECHOCARDIOGRAM COMPLETE BUBBLE STUDY
AR max vel: 0.87 cm2
AV Area VTI: 0.89 cm2
AV Area mean vel: 0.86 cm2
AV Mean grad: 46.3 mm[Hg]
AV Peak grad: 82.9 mm[Hg]
Ao pk vel: 4.55 m/s
Area-P 1/2: 3.26 cm2
Calc EF: 63 %
MV VTI: 2.27 cm2
S' Lateral: 4.2 cm
Single Plane A2C EF: 61.2 %
Single Plane A4C EF: 65.2 %

## 2023-09-20 LAB — BASIC METABOLIC PANEL
Anion gap: 10 (ref 5–15)
BUN: 15 mg/dL (ref 8–23)
CO2: 20 mmol/L — ABNORMAL LOW (ref 22–32)
Calcium: 8.8 mg/dL — ABNORMAL LOW (ref 8.9–10.3)
Chloride: 111 mmol/L (ref 98–111)
Creatinine, Ser: 1.13 mg/dL (ref 0.61–1.24)
GFR, Estimated: >60 mL/min (ref >60)
Glucose, Bld: 266 mg/dL — ABNORMAL HIGH (ref 70–99)
Potassium: 4.3 mmol/L (ref 3.5–5.1)
Sodium: 141 mmol/L (ref 135–145)

## 2023-09-20 LAB — PHOSPHORUS: Phosphorus: 3.2 mg/dL (ref 2.5–4.6)

## 2023-09-20 LAB — GLUCOSE, CAPILLARY
Glucose-Capillary: 206 mg/dL — ABNORMAL HIGH (ref 70–99)
Glucose-Capillary: 264 mg/dL — ABNORMAL HIGH (ref 70–99)

## 2023-09-20 LAB — CBC
HCT: 32.1 % — ABNORMAL LOW (ref 39.0–52.0)
Hemoglobin: 10.2 g/dL — ABNORMAL LOW (ref 13.0–17.0)
MCH: 36.3 pg — ABNORMAL HIGH (ref 26.0–34.0)
MCHC: 31.8 g/dL (ref 30.0–36.0)
MCV: 114.2 fL — ABNORMAL HIGH (ref 80.0–100.0)
Platelets: 105 10*3/uL — ABNORMAL LOW (ref 150–400)
RBC: 2.81 MIL/uL — ABNORMAL LOW (ref 4.22–5.81)
RDW: 19.5 % — ABNORMAL HIGH (ref 11.5–15.5)
WBC: 13.4 10*3/uL — ABNORMAL HIGH (ref 4.0–10.5)
nRBC: 0 % (ref 0.0–0.2)

## 2023-09-20 LAB — CBG MONITORING, ED
Glucose-Capillary: 300 mg/dL — ABNORMAL HIGH (ref 70–99)
Glucose-Capillary: 262 mg/dL — ABNORMAL HIGH (ref 70–99)

## 2023-09-20 LAB — PROTIME-INR
INR: 1.4 — ABNORMAL HIGH (ref 0.8–1.2)
Prothrombin Time: 17.7 s — ABNORMAL HIGH (ref 11.4–15.2)

## 2023-09-20 LAB — BETA-HYDROXYBUTYRIC ACID: Beta-Hydroxybutyric Acid: 0.34 mmol/L — ABNORMAL HIGH (ref 0.05–0.27)

## 2023-09-20 LAB — HEMOGLOBIN A1C
Hgb A1c MFr Bld: 4.4 % — ABNORMAL LOW (ref 4.8–5.6)
Mean Plasma Glucose: 79.58 mg/dL

## 2023-09-20 LAB — AMMONIA: Ammonia: 13 umol/L (ref 9–35)

## 2023-09-20 LAB — MAGNESIUM: Magnesium: 1.6 mg/dL — ABNORMAL LOW (ref 1.7–2.4)

## 2023-09-20 LAB — MRSA NEXT GEN BY PCR, NASAL: MRSA by PCR Next Gen: NOT DETECTED

## 2023-09-20 MED ORDER — NOREPINEPHRINE 4 MG/250ML-% IV SOLN: 0.0000 ug/min | INTRAVENOUS | Status: DC

## 2023-09-20 MED ORDER — METRONIDAZOLE 500 MG/100ML IV SOLN
500.0000 mg | Freq: Two times a day (BID) | INTRAVENOUS | Status: DC
  Administered 2023-09-20 (×2): 500 mg via INTRAVENOUS
  Filled 2023-09-20 (×2): qty 100

## 2023-09-20 MED ORDER — ACETAMINOPHEN 10 MG/ML IV SOLN
500.0000 mg | Freq: Four times a day (QID) | INTRAVENOUS | Status: DC | PRN
  Administered 2023-09-20: 500 mg via INTRAVENOUS
  Filled 2023-09-20: qty 100

## 2023-09-20 MED ORDER — SODIUM CHLORIDE 0.9% FLUSH
3.0000 mL | Freq: Two times a day (BID) | INTRAVENOUS | Status: DC
  Administered 2023-09-20 – 2023-09-27 (×14): 3 mL via INTRAVENOUS

## 2023-09-20 MED ORDER — VANCOMYCIN HCL 1500 MG/300ML IV SOLN: 1500.0000 mg | INTRAVENOUS | Status: DC

## 2023-09-20 MED ORDER — ALBUTEROL SULFATE (2.5 MG/3ML) 0.083% IN NEBU: 2.5000 mg | INHALATION_SOLUTION | RESPIRATORY_TRACT | Status: DC | PRN

## 2023-09-20 MED ORDER — PANTOPRAZOLE SODIUM 40 MG PO TBEC: 40.0000 mg | DELAYED_RELEASE_TABLET | Freq: Two times a day (BID) | ORAL | Status: DC

## 2023-09-20 MED ORDER — SODIUM CHLORIDE 0.9 % IV SOLN
250.0000 mL | INTRAVENOUS | Status: AC
Start: 2023-09-20 — End: 2023-09-21

## 2023-09-20 MED ORDER — CHLORHEXIDINE GLUCONATE CLOTH 2 % EX PADS
6.0000 | MEDICATED_PAD | Freq: Every day | CUTANEOUS | Status: DC
  Administered 2023-09-20 – 2023-09-27 (×8): 6 via TOPICAL

## 2023-09-20 MED ORDER — INSULIN ASPART 100 UNIT/ML IJ SOLN
0.0000 [IU] | INTRAMUSCULAR | Status: DC
  Administered 2023-09-20 (×2): 5 [IU] via SUBCUTANEOUS
  Administered 2023-09-20: 3 [IU] via SUBCUTANEOUS
  Administered 2023-09-20: 5 [IU] via SUBCUTANEOUS
  Administered 2023-09-21: 2 [IU] via SUBCUTANEOUS
  Administered 2023-09-21: 3 [IU] via SUBCUTANEOUS
  Administered 2023-09-21: 5 [IU] via SUBCUTANEOUS
  Administered 2023-09-21: 2 [IU] via SUBCUTANEOUS
  Administered 2023-09-21 (×2): 3 [IU] via SUBCUTANEOUS
  Administered 2023-09-22: 5 [IU] via SUBCUTANEOUS
  Administered 2023-09-22: 3 [IU] via SUBCUTANEOUS
  Administered 2023-09-22: 2 [IU] via SUBCUTANEOUS
  Administered 2023-09-22: 7 [IU] via SUBCUTANEOUS
  Administered 2023-09-22 – 2023-09-23 (×3): 3 [IU] via SUBCUTANEOUS
  Administered 2023-09-23: 9 [IU] via SUBCUTANEOUS
  Administered 2023-09-23: 5 [IU] via SUBCUTANEOUS
  Administered 2023-09-23: 9 [IU] via SUBCUTANEOUS
  Administered 2023-09-23 (×2): 3 [IU] via SUBCUTANEOUS
  Administered 2023-09-24: 2 [IU] via SUBCUTANEOUS
  Administered 2023-09-24: 5 [IU] via SUBCUTANEOUS
  Administered 2023-09-24: 7 [IU] via SUBCUTANEOUS
  Administered 2023-09-24: 9 [IU] via SUBCUTANEOUS
  Administered 2023-09-24: 3 [IU] via SUBCUTANEOUS
  Administered 2023-09-25: 5 [IU] via SUBCUTANEOUS
  Administered 2023-09-25: 2 [IU] via SUBCUTANEOUS
  Administered 2023-09-25 (×2): 3 [IU] via SUBCUTANEOUS
  Administered 2023-09-25: 9 [IU] via SUBCUTANEOUS
  Filled 2023-09-20 (×2): qty 1

## 2023-09-20 MED ORDER — INSULIN GLARGINE-YFGN 100 UNIT/ML ~~LOC~~ SOLN
30.0000 [IU] | Freq: Every day | SUBCUTANEOUS | Status: DC
  Filled 2023-09-20 (×2): qty 0.3

## 2023-09-20 MED ORDER — NOREPINEPHRINE 4 MG/250ML-% IV SOLN: 2.0000 ug/min | INTRAVENOUS | Status: DC

## 2023-09-20 MED ORDER — MAGNESIUM SULFATE 2 GM/50ML IV SOLN
2.0000 g | Freq: Once | INTRAVENOUS | Status: AC
  Administered 2023-09-20: 2 g via INTRAVENOUS
  Filled 2023-09-20: qty 50

## 2023-09-20 MED ORDER — LACTATED RINGERS IV BOLUS
500.0000 mL | Freq: Once | INTRAVENOUS | Status: AC
  Administered 2023-09-20: 500 mL via INTRAVENOUS

## 2023-09-20 MED ORDER — LACTATED RINGERS IV SOLN: INTRAVENOUS | Status: AC

## 2023-09-20 MED ORDER — SODIUM CHLORIDE 0.9 % IV SOLN
2.0000 g | INTRAVENOUS | Status: DC
  Administered 2023-09-20 – 2023-09-26 (×7): 2 g via INTRAVENOUS
  Filled 2023-09-20 (×8): qty 20

## 2023-09-20 MED ORDER — PANTOPRAZOLE SODIUM 40 MG IV SOLR
40.0000 mg | Freq: Two times a day (BID) | INTRAVENOUS | Status: AC
Start: 2023-09-20 — End: ?
  Administered 2023-09-20 – 2023-09-27 (×15): 40 mg via INTRAVENOUS
  Filled 2023-09-20 (×15): qty 10

## 2023-09-20 MED ORDER — INSULIN GLARGINE-YFGN 100 UNIT/ML ~~LOC~~ SOLN
10.0000 [IU] | Freq: Every day | SUBCUTANEOUS | Status: DC
  Administered 2023-09-20 – 2023-09-25 (×6): 10 [IU] via SUBCUTANEOUS
  Filled 2023-09-20 (×8): qty 0.1

## 2023-09-20 MED ORDER — MELATONIN 3 MG PO TABS: 6.0000 mg | ORAL_TABLET | Freq: Every evening | ORAL | Status: DC | PRN

## 2023-09-20 MED ORDER — ACETAMINOPHEN 500 MG PO TABS: 1000.0000 mg | ORAL_TABLET | Freq: Four times a day (QID) | ORAL | Status: DC | PRN

## 2023-09-20 MED ORDER — ATORVASTATIN CALCIUM 40 MG PO TABS: 40.0000 mg | ORAL_TABLET | Freq: Every day | ORAL | Status: DC

## 2023-09-20 MED ORDER — SODIUM CHLORIDE 0.9 % IV SOLN
2.0000 g | Freq: Three times a day (TID) | INTRAVENOUS | Status: DC
  Administered 2023-09-20 (×2): 2 g via INTRAVENOUS
  Filled 2023-09-20 (×2): qty 12.5

## 2023-09-20 MED ORDER — INSULIN ASPART 100 UNIT/ML IJ SOLN: 5.0000 [IU] | Freq: Three times a day (TID) | INTRAMUSCULAR | Status: DC

## 2023-09-20 MED ORDER — INSULIN ASPART 100 UNIT/ML IJ SOLN: 0.0000 [IU] | Freq: Three times a day (TID) | INTRAMUSCULAR | Status: DC

## 2023-09-20 MED ORDER — ACETAMINOPHEN 650 MG RE SUPP
650.0000 mg | RECTAL | Status: DC | PRN
  Administered 2023-09-20 (×2): 650 mg via RECTAL
  Filled 2023-09-20 (×2): qty 1

## 2023-09-20 MED ORDER — MONTELUKAST SODIUM 10 MG PO TABS: 10.0000 mg | ORAL_TABLET | Freq: Every day | ORAL | Status: DC

## 2023-09-20 NOTE — ED Notes (Signed)
Dexcom to right upper arm removed. Wife aware and at bedside.

## 2023-09-20 NOTE — Plan of Care (Signed)
  Problem: Fluid Volume: Goal: Ability to maintain a balanced intake and output will improve Outcome: Progressing   Problem: Health Behavior/Discharge Planning: Goal: Ability to identify and utilize available resources and services will improve Outcome: Progressing   Problem: Health Behavior/Discharge Planning: Goal: Ability to manage health-related needs will improve Outcome: Progressing

## 2023-09-20 NOTE — Inpatient Diabetes Management (Signed)
Inpatient Diabetes Program Recommendations  AACE/ADA: New Consensus Statement on Inpatient Glycemic Control   Target Ranges:  Prepandial:   less than 140 mg/dL      Peak postprandial:   less than 180 mg/dL (1-2 hours)      Critically ill patients:  140 - 180 mg/dL    Latest Reference Range & Units 09/20/23 08:01  Glucose-Capillary 70 - 99 mg/dL 829 (H)    Latest Reference Range & Units 09/19/23 18:48 09/20/23 02:58  Glucose 70 - 99 mg/dL 562 (H) 130 (H)   Review of Glycemic Control  Diabetes history: DM2 Outpatient Diabetes medications: Toujeo 20-50 units daily, Humalog 20-30 units with meals Current orders for Inpatient glycemic control: Novolog 0-9 units Q4H  Inpatient Diabetes Program Recommendations:    Insulin: Lab glucose 300 mg/dl today and no basal insulin given since arrival.  Please consider ordering Semglee 10 units Q24H.  NOTE: Patient in ED and being admitted with sepsis, right side weakness, and encephalopathy. Per H&P, it is noted patient has DM1. In reviewing chart, noted patient seen PCP on 09/13/23 and it is noted patient has DM2 and had multiple episodes of hypoglycemia over the last several months; patient was asked to decrease Toujeo to 40 units daily and use Humalog per sliding scale.   Thanks, Orlando Penner, RN, MSN, CDCES Diabetes Coordinator Inpatient Diabetes Program [phone] (Team Pager from 8am to 5pm)

## 2023-09-20 NOTE — Progress Notes (Signed)
  Echocardiogram 2D Echocardiogram has been performed.  Ocie Doyne RDCS 09/20/2023, 9:09 AM

## 2023-09-20 NOTE — Plan of Care (Signed)

## 2023-09-20 NOTE — Progress Notes (Signed)
Transition of Care Department [facility]) has reviewed patient and no other TOC needs have been identified at this time. We will continue to monitor patient advancement through interdisciplinary progression rounds. If new patient transition needs arise, please place a TOC consult.    09/20/23 0755  TOC Brief Assessment  Insurance and Status Reviewed  Patient has primary care physician Yes  Home environment has been reviewed Lives with wife.  Prior level of function: Independent.  Prior/Current Home Services No current home services  Social Drivers of Health Review SDOH reviewed no interventions necessary  Readmission risk has been reviewed Yes  Transition of care needs no transition of care needs at this time

## 2023-09-20 NOTE — Telephone Encounter (Signed)
FYI for PCP  Copied from CRM [id number]. Topic: General - Other >> Sep 20, 2023  9:13 AM Tony Cantu wrote: Reason for CRM: Patient is currently inpatient at [facility]. Was admitted around 4pm 09/19/23. Headed to ICU when a bed opens, patient is septic and they will be testing for stroke. FYI from spouse Tony Cantu.

## 2023-09-20 NOTE — Progress Notes (Signed)
PHARMACY - PHYSICIAN COMMUNICATION CRITICAL VALUE ALERT - BLOOD CULTURE IDENTIFICATION (BCID)  Tony Cantu is an 73 y.o. male who presented to [facility] on 09/19/2023 with a chief complaint of weakness.   Assessment:  Patient presented with weakness now found to have strep bacteremia in 2/2 blood cultures.   Name of physician (or Provider) Contacted: Sherryll Burger  Current antibiotics: vancomycin, flagyl, cefepime  Changes to prescribed antibiotics recommended:  Recommendations accepted by provider Will narrow antibiotics to ceftriaxone 2g q24 hours  Results for orders placed or performed during the hospital encounter of 09/19/23  Blood Culture ID Panel (Reflexed) (Collected: 09/19/2023  6:48 PM)  Result Value Ref Range   Enterococcus faecalis NOT DETECTED NOT DETECTED   Enterococcus Faecium NOT DETECTED NOT DETECTED   Listeria monocytogenes NOT DETECTED NOT DETECTED   Staphylococcus species NOT DETECTED NOT DETECTED   Staphylococcus aureus (BCID) NOT DETECTED NOT DETECTED   Staphylococcus epidermidis NOT DETECTED NOT DETECTED   Staphylococcus lugdunensis NOT DETECTED NOT DETECTED   Streptococcus species DETECTED (A) NOT DETECTED   Streptococcus agalactiae NOT DETECTED NOT DETECTED   Streptococcus pneumoniae NOT DETECTED NOT DETECTED   Streptococcus pyogenes NOT DETECTED NOT DETECTED   A.calcoaceticus-baumannii NOT DETECTED NOT DETECTED   Bacteroides fragilis NOT DETECTED NOT DETECTED   Enterobacterales NOT DETECTED NOT DETECTED   Enterobacter cloacae complex NOT DETECTED NOT DETECTED   Escherichia coli NOT DETECTED NOT DETECTED   Klebsiella aerogenes NOT DETECTED NOT DETECTED   Klebsiella oxytoca NOT DETECTED NOT DETECTED   Klebsiella pneumoniae NOT DETECTED NOT DETECTED   Proteus species NOT DETECTED NOT DETECTED   Salmonella species NOT DETECTED NOT DETECTED   Serratia marcescens NOT DETECTED NOT DETECTED   Haemophilus influenzae NOT DETECTED NOT DETECTED   Neisseria  meningitidis NOT DETECTED NOT DETECTED   Pseudomonas aeruginosa NOT DETECTED NOT DETECTED   Stenotrophomonas maltophilia NOT DETECTED NOT DETECTED   Candida albicans NOT DETECTED NOT DETECTED   Candida auris NOT DETECTED NOT DETECTED   Candida glabrata NOT DETECTED NOT DETECTED   Candida krusei NOT DETECTED NOT DETECTED   Candida parapsilosis NOT DETECTED NOT DETECTED   Candida tropicalis NOT DETECTED NOT DETECTED   Cryptococcus neoformans/gattii NOT DETECTED NOT DETECTED   Sheppard Coil PharmD., BCPS Clinical Pharmacist 09/20/2023 3:22 PM

## 2023-09-20 NOTE — Consult Note (Addendum)
Gastroenterology Consult   Referring Provider: No ref. provider found Primary Care Physician:  [name], DO Primary Gastroenterologist:  Dr.Pyrtle ([facility])  Patient ID: Tony Cantu; [id number]; 06-22-1951   Admit date: 09/19/2023  LOS: 0 days   Date of Consultation: 09/20/2023  Reason for Consultation:  Melena   History of Present Illness   Tony Cantu is a 73 y.o. year old male with history of aortic stenosis, osteoarthritis, BPH, cirrhosis, portal hypertension, type 2 diabetes, paroxysmal A-fib on Eliquis, known gastric ulcer and peptic ulcer disease, gastric varices, hypertension, OSA on CPAP who presented to the ED with fever and generalized weakness.  Wife reported decompensation over the last 3 days but worse over the past 24 hours.  Patient with more confusion and not able to answer questions.patient also had episode of melanotic diarrhea in the ED, GI consulted for further evaluation.   ED course: Hemoglobin 10.2 UA unremarkable COVID/flu/RSV negative Tmax 103.5 Lactate 2.3 Blood cultures with gram positive cocci   Patient started on broad-spectrum antibiotics with fluid resuscitation, blood cultures drawn.  Consult: Pts wife who is a nurse helps to provides most of the history.  She states patient was in his normal state of health Monday night. Patient woke her up Tuesday and could not get out of bed alone. She helped him up to the restroom and then he ate and drank and seemed okay. She went on to work and checked on him later that morning, he seemed okay, she called again later that afternoon and he did not answer so she came home to check on him and he seemed "dazed" which is when she called EMS. She states that he has not complained of any pain other than his knees which is chronic. No abdominal pain, nausea, vomiting, constipation or diarrhea. Patient denied any abdominal pain. Denies rectal bleeding or melena. Denies any overt swelling to his abdomen or  legs. She reports he has not really been out in public or around anyone recently that would have been sick. Wife reports he did have a crown that came off in his mouth last week, could not get in to see his dentist until next week for this.   She reports he had an iron infusion here last Friday and overall felt okay from this other than a mild headache. Was referred to hematology here at [facility] after he had persistent anemia on labs with his PCP.   Last dose of eliquis was Monday night   Last Colonoscopy:09/2022 to 3 to 5 mm polyps in the cecum, one 3 mm polyp in transverse colon, one 4 mm polyp in descending colon, moderate diverticulosis in the sigmoid colon, descending colon and ascending colon, internal hemorrhoids (tubular adenoma/hyperplastic polyps) recommended to repeat colonoscopy in 5 years  Last EGD:09/2022 examined esophagus was normal, no esophageal varices, single 4 mm angiectasia with no bleeding found in gastric body, moderate portal hypertensive gastropathy found in gastric fundus and gastric body, mild gastric antral vascular ectasia without bleeding present in gastric antrum, examined duodenum normal   Past Medical History:  Diagnosis Date   Aortic atherosclerosis (HCC)    Aortic stenosis    Arthritis    Asthma    BPH (benign prostatic hypertrophy)    Cirrhosis (HCC) 2016   Colon polyps    Diabetes mellitus without complication (HCC)    TYPE 1    Diverticulosis    Dysrhythmia 04/19/2021   A-fib noted with possible A-fib RVR   Gastric ulcer  Gastric varices    right   Gastritis    GERD (gastroesophageal reflux disease)    Headache    History- pt states these were migraines that occurred in the 1980's   Heart murmur    History of kidney stones    Hx of adenomatous colonic polyps    Hypertension    Iron deficiency anemia due to chronic blood loss 08/16/2023   PONV (postoperative nausea and vomiting)    pt states he has never had post op nausea or vomiting   Portal  hypertensive gastropathy (HCC)    Renal cyst, right    Seizures (HCC)    HYPOGLYCEMIC LAST 1 AND 1/2 YRS AGO   Sleep apnea    uses CPAP nightly   Testicle trouble    one testicle BORN WITH   Tubular adenoma of colon     Prior to Admission medications   Medication Sig Start Date End Date Taking? Authorizing Provider  acetaminophen (TYLENOL) 500 MG tablet Take 1,000 mg by mouth every 6 (six) hours as needed for moderate pain.   Yes [provider]  apixaban (ELIQUIS) 5 MG TABS tablet Take 1 tablet (5 mg total) by mouth 2 (two) times daily. 06/08/23  Yes Gottschalk, Kathie Rhodes M, DO  atorvastatin (LIPITOR) 40 MG tablet Take 1 tablet (40 mg total) by mouth daily. 06/08/23  Yes Delynn Flavin M, DO  b complex vitamins tablet Take 1 tablet by mouth daily.   Yes [provider]  Cholecalciferol (DIALYVITE VITAMIN D 5000) 125 MCG (5000 UT) capsule Take 5,000 Units by mouth daily.   Yes [provider]  Chromium Picolinate (CHROMIUM PICOLATE PO) Take 1 tablet by mouth daily.   Yes [provider]  CINNAMON PO Take 1,080 mg by mouth daily.   Yes [provider]  Coenzyme Q10 (COQ10) 100 MG CAPS Take 100 mg by mouth daily.   Yes [provider]  diltiazem (CARDIZEM) 30 MG tablet Take 1 tablet (30 mg total) by mouth 2 (two) times daily. Patient must schedule annual office visit for further refills 06/26/23  Yes Rollene Rotunda, MD  ferrous sulfate 325 (65 FE) MG EC tablet Take 1 tablet (325 mg total) by mouth daily with breakfast. 01/18/23  Yes Pennington, Rebekah M, PA-C  furosemide (LASIX) 20 MG tablet Take 1.5 tablets (30 mg total) by mouth daily. 06/08/23  Yes Gottschalk, Ashly M, DO  Glucagon, rDNA, (GLUCAGON EMERGENCY) 1 MG KIT INJECT AS DIRECTED INTO  UPPER ARM, THIGH OR  BUTTOCKS AS NEEDED FOR  SEVERE HYPOGLYCEMIA. SEEK  MEDICAL ATTENTION AFTER USE Patient taking differently: Inject 1 mg into the muscle as needed (HYPOGLYCEMIA). 06/14/21  Yes  Deliah Boston F, FNP  Glucosamine-Chondroit-Vit C-Mn (GLUCOSAMINE 1500 COMPLEX) CAPS Take 2 capsules by mouth daily.   Yes [provider]  insulin glargine, 2 Unit Dial, (TOUJEO MAX SOLOSTAR) 300 UNIT/ML Solostar Pen Inject 20-50 Units into the skin daily. 06/08/23  Yes Gottschalk, Ashly M, DO  insulin lispro (HUMALOG) 100 UNIT/ML injection Inject 0.2-0.3 mLs (20-30 Units total) into the skin 3 (three) times daily with meals. And per sliding scale.  ** dose change Patient taking differently: Inject 20-30 Units into the skin as needed for high blood sugar. And per sliding scale.  ** dose change Up to 6 to 8 times daily 06/08/23  Yes Delynn Flavin M, DO  Magnesium 250 MG TABS Take 250 mg by mouth daily.   Yes [provider]  montelukast (SINGULAIR) 10 MG tablet Take  1 tablet (10 mg total) by mouth at bedtime. 06/08/23  Yes Gottschalk, Kathie Rhodes M, DO  Multiple Vitamins-Minerals (EYE HEALTH) CAPS Take 1 capsule by mouth daily.   Yes [provider]  pantoprazole (PROTONIX) 40 MG tablet TAKE 1 TABLET BY MOUTH TWICE  DAILY BEFORE MEALS 06/14/23  Yes Pyrtle, Carie Caddy, MD  potassium chloride SA (KLOR-CON M) 20 MEQ tablet Take 1 tablet (20 mEq total) by mouth daily. 06/08/23  Yes Gottschalk, Ashly M, DO  ramelteon (ROZEREM) 8 MG tablet Take 1 tablet (8 mg total) by mouth at bedtime. 06/08/23  Yes Delynn Flavin M, DO  Testosterone 30 MG/ACT SOLN Apply 1 pump under each axilla once daily. 07/21/23  Yes Gottschalk, Kathie Rhodes M, DO  Turmeric Curcumin 500 MG CAPS Take 500 mg by mouth daily.   Yes [provider]  valsartan (DIOVAN) 40 MG tablet Take 1 tablet (40 mg total) by mouth daily. 06/08/23  Yes Gottschalk, Kathie Rhodes M, DO  Blood Glucose Monitoring Suppl (CONTOUR NEXT ONE) KIT Test BS QID and as needed Dx E11.9 06/04/20   Delynn Flavin M, DO  Continuous Blood Gluc Sensor (FREESTYLE LIBRE SENSOR SYSTEM) MISC Check BS eight (8) times a day. Dx E10.9 06/29/22   Delynn Flavin M, DO  glucose blood (CONTOUR NEXT TEST) test strip Test BS QID and as needed Dx E11.9 11/08/21   [name], DO  Microlet Lancets MISC Test BS QID and as needed Dx E11.9 06/04/20   [name], DO    Current Facility-Administered Medications  Medication Dose Route Frequency Provider Last Rate Last Admin   acetaminophen (TYLENOL) suppository 650 mg  650 mg Rectal Q4H PRN Sherryll Burger, Pratik D, DO       albuterol (PROVENTIL) (2.5 MG/3ML) 0.083% nebulizer solution 2.5 mg  2.5 mg Nebulization Q4H PRN Segars, Christiane Ha, MD       ceFEPIme (MAXIPIME) 2 g in sodium chloride 0.9 % 100 mL IVPB  2 g Intravenous Bebe Liter, MD   Stopped at 09/20/23 0651   insulin aspart (novoLOG) injection 0-9 Units  0-9 Units Subcutaneous Q4H Sherryll Burger, Pratik D, DO   5 Units at 09/20/23 5284   lactated ringers infusion   Intravenous Continuous Dolly Rias, MD 100 mL/hr at 09/20/23 0712 New Bag at 09/20/23 1324   metroNIDAZOLE (FLAGYL) IVPB 500 mg  500 mg Intravenous Domenica Fail, MD   Stopped at 09/20/23 0202   pantoprazole (PROTONIX) injection 40 mg  40 mg Intravenous Q12H Shah, Pratik D, DO       sodium chloride flush (NS) 0.9 % injection 3 mL  3 mL Intravenous Q12H Dolly Rias, MD   3 mL at 09/20/23 0103   Current Outpatient Medications  Medication Sig Dispense Refill   acetaminophen (TYLENOL) 500 MG tablet Take 1,000 mg by mouth every 6 (six) hours as needed for moderate pain.     apixaban (ELIQUIS) 5 MG TABS tablet Take 1 tablet (5 mg total) by mouth 2 (two) times daily. 180 tablet 3   atorvastatin (LIPITOR) 40 MG tablet Take 1 tablet (40 mg total) by mouth daily. 90 tablet 3   b complex vitamins tablet Take 1 tablet by mouth daily.     Cholecalciferol (DIALYVITE VITAMIN D 5000) 125 MCG (5000 UT) capsule Take 5,000 Units by mouth daily.     Chromium Picolinate (CHROMIUM PICOLATE PO) Take 1 tablet by mouth daily.     CINNAMON PO Take 1,080 mg by mouth daily.     Coenzyme Q10  (  COQ10) 100 MG CAPS Take 100 mg by mouth daily.     diltiazem (CARDIZEM) 30 MG tablet Take 1 tablet (30 mg total) by mouth 2 (two) times daily. Patient must schedule annual office visit for further refills 15 tablet 0   ferrous sulfate 325 (65 FE) MG EC tablet Take 1 tablet (325 mg total) by mouth daily with breakfast. 90 tablet 3   furosemide (LASIX) 20 MG tablet Take 1.5 tablets (30 mg total) by mouth daily. 135 tablet 3   Glucagon, rDNA, (GLUCAGON EMERGENCY) 1 MG KIT INJECT AS DIRECTED INTO  UPPER ARM, THIGH OR  BUTTOCKS AS NEEDED FOR  SEVERE HYPOGLYCEMIA. SEEK  MEDICAL ATTENTION AFTER USE (Patient taking differently: Inject 1 mg into the muscle as needed (HYPOGLYCEMIA).) 3 kit 0   Glucosamine-Chondroit-Vit C-Mn (GLUCOSAMINE 1500 COMPLEX) CAPS Take 2 capsules by mouth daily.     insulin glargine, 2 Unit Dial, (TOUJEO MAX SOLOSTAR) 300 UNIT/ML Solostar Pen Inject 20-50 Units into the skin daily. 15 mL 3   insulin lispro (HUMALOG) 100 UNIT/ML injection Inject 0.2-0.3 mLs (20-30 Units total) into the skin 3 (three) times daily with meals. And per sliding scale.  ** dose change (Patient taking differently: Inject 20-30 Units into the skin as needed for high blood sugar. And per sliding scale.  ** dose change Up to 6 to 8 times daily) 80 mL 3   Magnesium 250 MG TABS Take 250 mg by mouth daily.     montelukast (SINGULAIR) 10 MG tablet Take 1 tablet (10 mg total) by mouth at bedtime. 90 tablet 3   Multiple Vitamins-Minerals (EYE HEALTH) CAPS Take 1 capsule by mouth daily.     pantoprazole (PROTONIX) 40 MG tablet TAKE 1 TABLET BY MOUTH TWICE  DAILY BEFORE MEALS 180 tablet 0   potassium chloride SA (KLOR-CON M) 20 MEQ tablet Take 1 tablet (20 mEq total) by mouth daily. 90 tablet 3   ramelteon (ROZEREM) 8 MG tablet Take 1 tablet (8 mg total) by mouth at bedtime. 90 tablet 3   Testosterone 30 MG/ACT SOLN Apply 1 pump under each axilla once daily. 270 mL 1   Turmeric Curcumin 500 MG CAPS Take 500 mg by mouth  daily.     valsartan (DIOVAN) 40 MG tablet Take 1 tablet (40 mg total) by mouth daily. 90 tablet 3   Blood Glucose Monitoring Suppl (CONTOUR NEXT ONE) KIT Test BS QID and as needed Dx E11.9 500 kit 3   Continuous Blood Gluc Sensor (FREESTYLE LIBRE SENSOR SYSTEM) MISC Check BS eight (8) times a day. Dx E10.9 7 each 3   glucose blood (CONTOUR NEXT TEST) test strip Test BS QID and as needed Dx E11.9 400 strip 3   Microlet Lancets MISC Test BS QID and as needed Dx E11.9 500 each 3    Review of Systems   Gen: Denies any chills, loss of appetite, change in weight or weight loss +fever CV: Denies chest pain, heart palpitations, syncope, edema  Resp: Denies shortness of breath with rest, cough, wheezing, coughing up blood, and pleurisy. GI:  denies  hematochezia, nausea, vomiting, diarrhea, constipation, dysphagia, odyonophagia, early satiety or weight loss. +melena in the ED (none at home) GU : Denies urinary burning, blood in urine, urinary frequency, and urinary incontinence. MS: Denies limitation of movement, swelling, cramps, and atrophy. +knee pain  Derm: Denies rash, itching, dry skin, hives. Psych: Denies depression, anxiety, memory loss, hallucinations, and confusion. Heme: Denies bruising or bleeding Neuro:  Denies any headaches, dizziness,  paresthesias, shaking  Physical Exam   Vital Signs in last 24 hours: Temp:  [98.5 F (36.9 C)-103.5 F (39.7 C)] 101.3 F (38.5 C) (01/22 0800) Pulse Rate:  [82-120] 82 (01/22 0800) Resp:  [16-27] 21 (01/22 0800) BP: (96-136)/(49-77) 99/57 (01/22 0800) SpO2:  [93 %-97 %] 96 % (01/22 0800) Weight:  [95 kg] 95 kg (01/21 1709)   General:   Alert,  Well-developed, pt declined to answer questions  Head:  Normocephalic and atraumatic. Eyes:  Sclera clear, no icterus.   Conjunctiva pink. Ears:  Normal auditory acuity. Mouth:  No deformity or lesions, dentition normal. Lungs:  Clear throughout to auscultation.   No wheezes, crackles, or rhonchi.  No acute distress. Heart:  Regular rate and rhythm; +murmur  Abdomen:  Soft, nontender and nondistended (no ascites), No masses, hepatosplenomegaly or hernias noted. Normal bowel sounds, without guarding, and without rebound.   Msk:  Symmetrical without gross deformities. Normal posture. Extremities:  Without clubbing or edema. Neurologic:  Alert but patient refused to answer questions when asked  Skin:  Intact without significant lesions or rashes. Psych:  Alert   Intake/Output from previous day: 01/21 0701 - 01/22 0700 In: 1698.6 [IV Piggyback:1698.6] Out: -  Intake/Output this shift: No intake/output data recorded.   Labs/Studies   Recent Labs Recent Labs    09/19/23 1848 09/20/23 0258  WBC 8.9 13.4*  HGB 10.2* 10.2*  HCT 31.4* 32.1*  PLT 126* 105*   BMET Recent Labs    09/19/23 1848 09/20/23 0258  NA 140 141  K 3.9 4.3  CL 109 111  CO2 20* 20*  GLUCOSE 161* 266*  BUN 14 15  CREATININE 0.98 1.13  CALCIUM 9.2 8.8*   LFT Recent Labs    09/19/23 1848 09/20/23 0258  PROT 6.6 6.1*  ALBUMIN 3.1* 2.7*  AST 64* 65*  ALT 61* 54*  ALKPHOS 104 80  BILITOT 1.2 1.6*  BILIDIR  --  0.5*  IBILI  --  1.1*    Radiology/Studies CT HEAD WO CONTRAST ( ) Result Date: 09/20/2023 CLINICAL DATA:  Encephalopathy, right-sided weakness EXAM: CT HEAD WITHOUT CONTRAST TECHNIQUE: Contiguous axial images were obtained from the base of the skull through the vertex without intravenous contrast. RADIATION DOSE REDUCTION: This exam was performed according to the departmental dose-optimization program which includes automated exposure control, adjustment of the mA and/or kV according to patient size and/or use of iterative reconstruction technique. COMPARISON:  None Available. FINDINGS: Brain: Normal anatomic configuration of the brain. Mild periventricular white matter changes are present likely the sequela small vessel ischemia. Tiny remote lacunar infarct noted within the right frontal  subcortical white matter. No acute intracranial hemorrhage or infarct. No mass effect or midline shift no abnormal intra or extra-axial mass lesion. Ventricular size is normal. Cerebellum is unremarkable. Vascular: No hyperdense vessel or unexpected calcification. Skull: Normal. Negative for fracture or focal lesion. Sinuses/Orbits: No acute finding. Other: Mastoid air cells and middle ear cavities are clear. IMPRESSION: 1. No acute intracranial abnormality. 2. Mild senescent change. 3. Tiny remote lacunar infarct within the right frontal subcortical white matter. Electronically Signed   By: Helyn Numbers M.D.   On: 09/20/2023 01:45   CT CHEST ABDOMEN PELVIS W CONTRAST Result Date: 09/19/2023 CLINICAL DATA:  Sepsis fever EXAM: CT CHEST, ABDOMEN, AND PELVIS WITH CONTRAST TECHNIQUE: Multidetector CT imaging of the chest, abdomen and pelvis was performed following the standard protocol during bolus administration of intravenous contrast. RADIATION DOSE REDUCTION: This exam was performed according to the  departmental dose-optimization program which includes automated exposure control, adjustment of the mA and/or kV according to patient size and/or use of iterative reconstruction technique. CONTRAST:  OMNIPAQUE IOHEXOL 300 MG/ML  SOLN COMPARISON:  Chest x-ray 09/19/2023, CT 04/28/2021, 02/20/2019 FINDINGS: CT CHEST FINDINGS Cardiovascular: Mild aortic atherosclerosis. No aneurysm. Mitral annular calcification. Aortic valvular calcification. Borderline cardiomegaly. No pericardial effusion Mediastinum/Nodes: Patent trachea. No thyroid mass. No suspicious lymph nodes. Esophagus within normal limits Lungs/Pleura: No acute airspace disease, pleural effusion or pneumothorax. Musculoskeletal: Sternum appears intact. There are degenerative changes of the spine. No acute osseous abnormality CT ABDOMEN PELVIS FINDINGS Hepatobiliary: Liver cirrhosis. No calcified gallstone or biliary dilatation Pancreas: Poorly visible,  presumably due to marked atrophy. No inflammation. Spleen: Normal in size without focal abnormality. Adrenals/Urinary Tract: Adrenal glands are normal. Kidneys show no hydronephrosis. Renal cysts for which no imaging follow-up is recommended. Mild left greater than right perinephric stranding. Possible areas of cortical hypoenhancement involving the kidneys on delayed views but limited by artifact from the patient's arms. There is also motion degradation. The bladder is unremarkable Stomach/Bowel: The stomach is nonenlarged. There is no dilated small bowel. No acute bowel Lheureux thickening. Diverticular disease of the colon. Vascular/Lymphatic: Mild aortic atherosclerosis. No aneurysm. No suspicious lymph nodes. Gastroesophageal varices. Collateral vessels in the left upper quadrant. Reproductive: Prostate is unremarkable. Other: Negative for pelvic effusion or free air. Mild skin thickening of the right anterior abdominal Dehart Musculoskeletal: Degenerative changes. No acute or suspicious osseous abnormality. IMPRESSION: 1. No CT evidence for acute intrathoracic abnormality. 2. Liver cirrhosis with evidence of portal hypertension including gastroesophageal varices. 3. Diverticular disease of the colon without acute inflammatory process. 4. Mild left greater than right perinephric stranding with possible areas of cortical hypoenhancement involving the kidneys on delayed views but limited by artifact from the patient's arms and motion degradation. Correlate with urinalysis to assess for UTI/pyelonephritis Aortic Atherosclerosis (ICD10-I70.0). Electronically Signed   By: Jasmine Pang M.D.   On: 09/19/2023 21:24   DG Chest Portable 1 View Result Date: 09/19/2023 CLINICAL DATA:  Generalized weakness and fever today. EXAM: PORTABLE CHEST 1 VIEW COMPARISON:  03/10/2022 FINDINGS: The cardiac silhouette remains mildly enlarged. Decreased prominence of the pulmonary vasculature. Mildly prominent interstitial markings with  an appearance compatible with chronic interstitial lung disease without superimposed pulmonary edema. No airspace consolidation or pleural fluid. Left shoulder prosthesis. Lower thoracic and upper lumbar spine degenerative changes. IMPRESSION: 1. No acute abnormality. 2. Mild cardiomegaly and mild chronic interstitial lung disease. Electronically Signed   By: Beckie Salts M.D.   On: 09/19/2023 18:24    Assessment   RENNIE GALINATO is a 73 y.o. year old male with history of aortic stenosis, osteoarthritis, BPH, cirrhosis, portal hypertension, type 2 diabetes, paroxysmal A-fib on Eliquis, known gastric ulcer and peptic ulcer disease, gastric varices, hypertension, OSA on CPAP who presented to the ED with fever and generalized weakness and episode of melanotic diarrhea in the ED. GI consulted for further evaluation.  Melena/worsening anemia: history of IDA. Seeing hematology and receiving iron infusions. hgb 10.2 on admission, remains stable this morning at 10.2. episode of melena in the ED with heme positive stool. Wife denies any melena, rectal bleeding or hematemesis at home. He has no GI complaints. Recent EGD and Colonoscopy as outlined above 09/2022 with a few polyps, GAVE, PHG, no EVs though CT this admission with Gastroesophageal varices. Would be important to evaluate the UGI tract given heme positive stool and melena once he is clinically stable,  very well could be bleeding from GAVE/PHG, low suspicion for EV bleed at this time. Last dose of eliquis was Monday night.  As he has no ascites on exam, given this would not suspect SBP as etiology for his fever/acute infection. He is on broad spectrum antibiotics, respiratory panels and UA have been negative, etiology of infection is not yet clear, though there has been concern for possible endocarditis.   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 Appears he has been well compensated thus far. EGD in early 2024 with no EVs, though CT this admission concerning for  gastroesophageal varices. No rectal bleeding or melena at home, though did have an episode of melena in the ED with heme positive stools, no hematemesis. No ascites on exam. LFTs mildly elevated here though likely reactive from acute illness.  *MELD 3.0 to be calculated once INR results   AMS: ammonia level 14, likely metabolic encephalopathy in setting of acute illness/febrile states.    Plan / Recommendations    PPI BID MELD labs daily Schedule EGD once clinically stable Continue with broad spectrum antibiotics Trend h&H, transfuse for hgb <7 Monitor for overt GI bleeding      09/20/2023, 9:30 AM  Esaul Dorwart L. Jeanmarie Hubert, MSN, APRN, AGNP-C Adult-Gerontology Nurse Practitioner [facility] at [facility]   Addendum** MELD 3.0 is 14

## 2023-09-20 NOTE — H&P (Addendum)
History and Physical    GEE KERBEL OZH:[id number] DOB: 1951/08/27 DOA: 09/19/2023  PCP: [name], DO   Patient coming from: Home   Chief Complaint:  Chief Complaint  Patient presents with   Weakness    HPI:  Tony Cantu is a 73 y.o. male with hx of type 1 diabetes, A-fib on Eliquis, cirrhosis, seizure disorder, aortic stenosis, hypertension, peptic ulcer disease, OA with bilateral knee replacement, polyclonal gammopathy, OSA on CPAP, who was brought in from home due to progressive encephalopathy.  History is obtained by patient's wife due to patient's altered mentation and somnolence.  At time of interview he is able to wake up and state his name but that is the extent he can do right now.  Per wife who is an Charity fundraiser, was Normal going to bed on Monday (LKWT 1/20 evening). Awoke on Tues 1/21 at 4AM; called out to wife for help getting up to the bathroom which is very unusual. At baseline fully independent with normal mental status, no DME. She checked blood glucose, was high in 200s. Checked temp and had fever to 101 F. More somnolent throughout the day. Wife had been at work, was calling to check / visiting during day. Around 3PM did not answer phone, she came to check on him and he was disoriented to time. Did not seem to have any focal weakness. No prior illness, chills, rigors, cough / cold symptoms, GI symptoms, urinary changes. Had headache on Friday. No hx of known prior stroke or chronic weakness.     Review of Systems:  had a fall about one month ago onto his knees, more knee pain over past month.   ROS complete and negative except as marked above   Allergies  Allergen Reactions   Dimetapp Children's Cold-Cough Other (See Comments)    Chest discomfort    Erythromycin Diarrhea   Sulfonamide Derivatives Diarrhea    Prior to Admission medications   Medication Sig Start Date End Date Taking? Authorizing Provider  acetaminophen (TYLENOL) 500 MG tablet Take 1,000 mg by  mouth every 6 (six) hours as needed for moderate pain.   Yes [provider]  apixaban (ELIQUIS) 5 MG TABS tablet Take 1 tablet (5 mg total) by mouth 2 (two) times daily. 06/08/23  Yes Gottschalk, Kathie Rhodes M, DO  atorvastatin (LIPITOR) 40 MG tablet Take 1 tablet (40 mg total) by mouth daily. 06/08/23  Yes Delynn Flavin M, DO  b complex vitamins tablet Take 1 tablet by mouth daily.   Yes [provider]  Cholecalciferol (DIALYVITE VITAMIN D 5000) 125 MCG (5000 UT) capsule Take 5,000 Units by mouth daily.   Yes [provider]  Chromium Picolinate (CHROMIUM PICOLATE PO) Take 1 tablet by mouth daily.   Yes [provider]  CINNAMON PO Take 1,080 mg by mouth daily.   Yes [provider]  Coenzyme Q10 (COQ10) 100 MG CAPS Take 100 mg by mouth daily.   Yes [provider]  diltiazem (CARDIZEM) 30 MG tablet Take 1 tablet (30 mg total) by mouth 2 (two) times daily. Patient must schedule annual office visit for further refills 06/26/23  Yes Rollene Rotunda, MD  ferrous sulfate 325 (65 FE) MG EC tablet Take 1 tablet (325 mg total) by mouth daily with breakfast. 01/18/23  Yes Pennington, Rebekah M, PA-C  furosemide (LASIX) 20 MG tablet Take 1.5 tablets (30 mg total) by mouth daily. 06/08/23  Yes Gottschalk, Kathie Rhodes M, DO  Glucagon, rDNA, (GLUCAGON EMERGENCY) 1  MG KIT INJECT AS DIRECTED INTO  UPPER ARM, THIGH OR  BUTTOCKS AS NEEDED FOR  SEVERE HYPOGLYCEMIA. SEEK  MEDICAL ATTENTION AFTER USE Patient taking differently: Inject 1 mg into the muscle as needed (HYPOGLYCEMIA). 06/14/21  Yes Deliah Boston F, FNP  Glucosamine-Chondroit-Vit C-Mn (GLUCOSAMINE 1500 COMPLEX) CAPS Take 2 capsules by mouth daily.   Yes [provider]  insulin glargine, 2 Unit Dial, (TOUJEO MAX SOLOSTAR) 300 UNIT/ML Solostar Pen Inject 20-50 Units into the skin daily. 06/08/23  Yes Gottschalk, Ashly M, DO  insulin lispro (HUMALOG) 100 UNIT/ML injection Inject 0.2-0.3 mLs (20-30 Units  total) into the skin 3 (three) times daily with meals. And per sliding scale.  ** dose change Patient taking differently: Inject 20-30 Units into the skin as needed for high blood sugar. And per sliding scale.  ** dose change Up to 6 to 8 times daily 06/08/23  Yes Delynn Flavin M, DO  Magnesium 250 MG TABS Take 250 mg by mouth daily.   Yes [provider]  montelukast (SINGULAIR) 10 MG tablet Take 1 tablet (10 mg total) by mouth at bedtime. 06/08/23  Yes Gottschalk, Kathie Rhodes M, DO  Multiple Vitamins-Minerals (EYE HEALTH) CAPS Take 1 capsule by mouth daily.   Yes [provider]  pantoprazole (PROTONIX) 40 MG tablet TAKE 1 TABLET BY MOUTH TWICE  DAILY BEFORE MEALS 06/14/23  Yes Pyrtle, Carie Caddy, MD  potassium chloride SA (KLOR-CON M) 20 MEQ tablet Take 1 tablet (20 mEq total) by mouth daily. 06/08/23  Yes Gottschalk, Ashly M, DO  ramelteon (ROZEREM) 8 MG tablet Take 1 tablet (8 mg total) by mouth at bedtime. 06/08/23  Yes Delynn Flavin M, DO  Testosterone 30 MG/ACT SOLN Apply 1 pump under each axilla once daily. 07/21/23  Yes Gottschalk, Kathie Rhodes M, DO  Turmeric Curcumin 500 MG CAPS Take 500 mg by mouth daily.   Yes [provider]  valsartan (DIOVAN) 40 MG tablet Take 1 tablet (40 mg total) by mouth daily. 06/08/23  Yes Gottschalk, Kathie Rhodes M, DO  Blood Glucose Monitoring Suppl (CONTOUR NEXT ONE) KIT Test BS QID and as needed Dx E11.9 06/04/20   Delynn Flavin M, DO  Continuous Blood Gluc Sensor (FREESTYLE LIBRE SENSOR SYSTEM) MISC Check BS eight (8) times a day. Dx E10.9 06/29/22   Delynn Flavin M, DO  glucose blood (CONTOUR NEXT TEST) test strip Test BS QID and as needed Dx E11.9 11/08/21   [name], DO  Microlet Lancets MISC Test BS QID and as needed Dx E11.9 06/04/20   [name], DO    Past Medical History:  Diagnosis Date   Aortic atherosclerosis (HCC)    Aortic stenosis    Arthritis    Asthma    BPH (benign prostatic hypertrophy)     Cirrhosis (HCC) 2016   Colon polyps    Diabetes mellitus without complication (HCC)    TYPE 1    Diverticulosis    Dysrhythmia 04/19/2021   A-fib noted with possible A-fib RVR   Gastric ulcer    Gastric varices    right   Gastritis    GERD (gastroesophageal reflux disease)    Headache    History- pt states these were migraines that occurred in the 1980's   Heart murmur    History of kidney stones    Hx of adenomatous colonic polyps    Hypertension    Iron deficiency anemia due to chronic blood loss 08/16/2023   PONV (postoperative nausea and vomiting)  pt states he has never had post op nausea or vomiting   Portal hypertensive gastropathy (HCC)    Renal cyst, right    Seizures (HCC)    HYPOGLYCEMIC LAST 1 AND 1/2 YRS AGO   Sleep apnea    uses CPAP nightly   Testicle trouble    one testicle BORN WITH   Tubular adenoma of colon     Past Surgical History:  Procedure Laterality Date   BACK SURGERY     89  LOWER    CARPAL TUNNEL RELEASE Right 10/13/2015   Procedure: RIGHT CARPAL TUNNEL RELEASE;  Surgeon: Cindee Salt, MD;  Location: [facility];  Service: Orthopedics;  Laterality: Right;   CARPAL TUNNEL RELEASE Left 07/05/2016   Procedure: LEFT CARPAL TUNNEL RELEASE;  Surgeon: Cindee Salt, MD;  Location: [facility];  Service: Orthopedics;  Laterality: Left;   COLONOSCOPY WITH ESOPHAGOGASTRODUODENOSCOPY (EGD)  10/05/2022   DRUG INDUCED ENDOSCOPY N/A 07/02/2021   Procedure: DRUG INDUCED SLEEP ENDOSCOPY;  Surgeon: Christia Reading, MD;  Location: [facility] OR;  Service: ENT;  Laterality: N/A;   IMPLANTATION OF HYPOGLOSSAL NERVE STIMULATOR Right 10/01/2021   Procedure: IMPLANTATION OF HYPOGLOSSAL NERVE STIMULATOR;  Surgeon: Christia Reading, MD;  Location: [facility] OR;  Service: ENT;  Laterality: Right;   INGUINAL HERNIA REPAIR  2003   right    KNEE ARTHROSCOPY Right 03/03/2016   LUMBAR DISC SURGERY  03/1995   Dr. Daleen Squibb, discectomy   LUMBAR  LAMINECTOMY/DECOMPRESSION MICRODISCECTOMY Right 10/06/2016   Procedure: RIGHT LUMBAR THREE - LUMBAR FOUR  LAMINECTOMY, FORAMINOTOMY AND MICRODISCECTOMY;  Surgeon: Shirlean Kelly, MD;  Location[facility] OR;  Service: Neurosurgery;  Laterality: Right;   SHOULDER SURGERY  11/28/2005   left partial   SHOULDER SURGERY  07/14/2006   RIGHT   TONSILLECTOMY  AGE 24 OR 4   TOTAL KNEE ARTHROPLASTY Right 03/07/2022   Procedure: RIGHT TOTAL KNEE ARTHROPLASTY;  Surgeon: Gean Birchwood, MD;  Location: [facility];  Service: Orthopedics;  Laterality: Right;   TOTAL KNEE ARTHROPLASTY Left 06/06/2022   Procedure: LEFT TOTAL KNEE ARTHROPLASTY;  Surgeon: Gean Birchwood, MD;  Location: [facility];  Service: Orthopedics;  Laterality: Left;   TOTAL SHOULDER ARTHROPLASTY Right 11/01/2018   Procedure: RIGHT SHOULDER REVISION TO REVERSE TOTAL SHOULDER;  Surgeon: Jones Broom, MD;  Location: [facility];  Service: Orthopedics;  Laterality: Right;  CHOICE ANESTHESIA WITH INTERSCALENE BLOCK EXPAREL, NEEDS RNFA     reports that he quit smoking about 20 years ago. His smoking use included cigarettes and pipe. He started smoking about 54 years ago. He has never used smokeless tobacco. He reports that he does not currently use alcohol. He reports that he does not use drugs.  Family History  Problem Relation Age of Onset   Heart failure Mother    Heart attack Mother        in her 38's   Alzheimer's disease Mother    Diabetes Mother    Asthma Father    Suicidality Father        in his 34's   Allergic rhinitis Sister    Heart failure Maternal Grandmother    Rheum arthritis Maternal Grandmother    Breast cancer Maternal Grandmother    Heart attack Maternal Grandmother        in her 22's   Colon cancer Neg Hx    Esophageal cancer Neg Hx    Pancreatic cancer Neg Hx    Liver disease Neg Hx      Physical Exam: Vitals:  09/20/23 0100 09/20/23 0103 09/20/23 0155 09/20/23 0330  BP: (!) 100/56  (!) 104/58 106/63  Pulse: 98  85    Resp: (!) 22  (!) 21 (!) 21  Temp:  (!) 101.5 F (38.6 C)    TempSrc:  Axillary    SpO2: 97%  97%   Weight:      Height:        Gen: Somnolent arouses to moderate nonpainful stimuli, briefly awake and then falls back asleep.  Acutely ill-appearing CV: Regular, S1 and S2 are obscured by 3/6 systolic ejection murmur throughout precordium, loudest at the apex. Resp: Normal WOB, CTAB anteriorly Abd: Flat, normoactive, nontender MSK: Symmetric, no edema  Skin: No rashes or lesions to exposed skin  Neuro: Somnolent arouses to moderate nonpainful stimuli, briefly awake and then falls back asleep.  CN exam limited, PERRL.  Appears to have right lower facial droop.  Motor 4 out of 5 in the right upper extremity, 3 out of 5 in the right lower extremity.  5 out of 5 on the left upper and lower.  Sensation appears intact to fine touch.  No clonus at the ankles Psych: Encephalopathic   Data review:   Labs reviewed, notable for:   Lactate 2.3 -> 1.8 Bicarb 20, no anion gap AST 64, ALT 61, other LFT normal High-sensitivity troponin 28 -> 35 WBC normal, hemoglobin 10, macrocytic, platelet 126 Urinalysis positive ketone, otherwise normal  Micro:  Results for orders placed or performed during the hospital encounter of 09/19/23  Blood culture (routine x 2)     Status: None (Preliminary result)   Collection Time: 09/19/23  6:48 PM   Specimen: Blood  Result Value Ref Range Status   Specimen Description BLOOD BLOOD LEFT HAND  Final   Special Requests   Final    BOTTLES DRAWN AEROBIC ONLY Blood Culture results may not be optimal due to an inadequate volume of blood received in culture bottles Performed at [facility], [address], [place], [place] [address]    Culture PENDING  Incomplete   Report Status PENDING  Incomplete  Blood culture (routine x 2)     Status: None (Preliminary result)   Collection Time: 09/19/23  6:48 PM   Specimen: Blood  Result Value Ref Range Status   Specimen  Description BLOOD BLOOD LEFT HAND  Final   Special Requests   Final    BOTTLES DRAWN AEROBIC ONLY Blood Culture results may not be optimal due to an inadequate volume of blood received in culture bottles Performed at [facility], [address], [place], [place] [address]    Culture PENDING  Incomplete   Report Status PENDING  Incomplete  Resp panel by RT-PCR (RSV, Flu A&B, Covid) Anterior Nasal Swab     Status: None   Collection Time: 09/19/23  7:30 PM   Specimen: Anterior Nasal Swab  Result Value Ref Range Status   SARS Coronavirus 2 by RT PCR NEGATIVE NEGATIVE Final    Comment: (NOTE) SARS-CoV-2 target nucleic acids are NOT DETECTED.  The SARS-CoV-2 RNA is generally detectable in upper respiratory specimens during the acute phase of infection. The lowest concentration of SARS-CoV-2 viral copies this assay can detect is 138 copies/mL. A negative result does not preclude SARS-Cov-2 infection and should not be used as the sole basis for treatment or other patient management decisions. A negative result may occur with  improper specimen collection/handling, submission of specimen other than nasopharyngeal swab, presence of viral mutation(s) within the areas targeted  by this assay, and inadequate number of viral copies(<138 copies/mL). A negative result must be combined with clinical observations, patient history, and epidemiological information. The expected result is Negative.  Fact Sheet for Patients:  BloggerCourse.com  Fact Sheet for Healthcare Providers:  SeriousBroker.it  This test is no t yet approved or cleared by the [place] FDA and  has been authorized for detection and/or diagnosis of SARS-CoV-2 by FDA under an Emergency Use Authorization (EUA). This EUA will remain  in effect (meaning this test can be used) for the duration of the COVID-19 declaration under Section 564(b)(1) of the Act, 21 U.S.C.section  360bbb-3(b)(1), unless the authorization is terminated  or revoked sooner.       Influenza A by PCR NEGATIVE NEGATIVE Final   Influenza B by PCR NEGATIVE NEGATIVE Final    Comment: (NOTE) The Xpert Xpress SARS-CoV-2/FLU/RSV plus assay is intended as an aid in the diagnosis of influenza from Nasopharyngeal swab specimens and should not be used as a sole basis for treatment. Nasal washings and aspirates are unacceptable for Xpert Xpress SARS-CoV-2/FLU/RSV testing.  Fact Sheet for Patients: BloggerCourse.com  Fact Sheet for Healthcare Providers: SeriousBroker.it  This test is not yet approved or cleared by the [place] FDA and has been authorized for detection and/or diagnosis of SARS-CoV-2 by FDA under an Emergency Use Authorization (EUA). This EUA will remain in effect (meaning this test can be used) for the duration of the COVID-19 declaration under Section 564(b)(1) of the Act, 21 U.S.C. section 360bbb-3(b)(1), unless the authorization is terminated or revoked.     Resp Syncytial Virus by PCR NEGATIVE NEGATIVE Final    Comment: (NOTE) Fact Sheet for Patients: BloggerCourse.com  Fact Sheet for Healthcare Providers: SeriousBroker.it  This test is not yet approved or cleared by the [place] FDA and has been authorized for detection and/or diagnosis of SARS-CoV-2 by FDA under an Emergency Use Authorization (EUA). This EUA will remain in effect (meaning this test can be used) for the duration of the COVID-19 declaration under Section 564(b)(1) of the Act, 21 U.S.C. section 360bbb-3(b)(1), unless the authorization is terminated or revoked.  Performed at [facility], [address], [place], [place] [address]    *Note: Due to a large number of results and/or encounters for the requested time period, some results have not been displayed. A complete set of results can be  found in Results Review.    Imaging reviewed:  CT HEAD WO CONTRAST ( ) Result Date: 09/20/2023 CLINICAL DATA:  Encephalopathy, right-sided weakness EXAM: CT HEAD WITHOUT CONTRAST TECHNIQUE: Contiguous axial images were obtained from the base of the skull through the vertex without intravenous contrast. RADIATION DOSE REDUCTION: This exam was performed according to the departmental dose-optimization program which includes automated exposure control, adjustment of the mA and/or kV according to patient size and/or use of iterative reconstruction technique. COMPARISON:  None Available. FINDINGS: Brain: Normal anatomic configuration of the brain. Mild periventricular white matter changes are present likely the sequela small vessel ischemia. Tiny remote lacunar infarct noted within the right frontal subcortical white matter. No acute intracranial hemorrhage or infarct. No mass effect or midline shift no abnormal intra or extra-axial mass lesion. Ventricular size is normal. Cerebellum is unremarkable. Vascular: No hyperdense vessel or unexpected calcification. Skull: Normal. Negative for fracture or focal lesion. Sinuses/Orbits: No acute finding. Other: Mastoid air cells and middle ear cavities are clear. IMPRESSION: 1. No acute intracranial abnormality. 2. Mild senescent change. 3. Tiny remote lacunar infarct within the right frontal  subcortical white matter. Electronically Signed   By: Helyn Numbers M.D.   On: 09/20/2023 01:45   CT CHEST ABDOMEN PELVIS W CONTRAST Result Date: 09/19/2023 CLINICAL DATA:  Sepsis fever EXAM: CT CHEST, ABDOMEN, AND PELVIS WITH CONTRAST TECHNIQUE: Multidetector CT imaging of the chest, abdomen and pelvis was performed following the standard protocol during bolus administration of intravenous contrast. RADIATION DOSE REDUCTION: This exam was performed according to the departmental dose-optimization program which includes automated exposure control, adjustment of the mA and/or kV  according to patient size and/or use of iterative reconstruction technique. CONTRAST:  OMNIPAQUE IOHEXOL 300 MG/ML  SOLN COMPARISON:  Chest x-ray 09/19/2023, CT 04/28/2021, 02/20/2019 FINDINGS: CT CHEST FINDINGS Cardiovascular: Mild aortic atherosclerosis. No aneurysm. Mitral annular calcification. Aortic valvular calcification. Borderline cardiomegaly. No pericardial effusion Mediastinum/Nodes: Patent trachea. No thyroid mass. No suspicious lymph nodes. Esophagus within normal limits Lungs/Pleura: No acute airspace disease, pleural effusion or pneumothorax. Musculoskeletal: Sternum appears intact. There are degenerative changes of the spine. No acute osseous abnormality CT ABDOMEN PELVIS FINDINGS Hepatobiliary: Liver cirrhosis. No calcified gallstone or biliary dilatation Pancreas: Poorly visible, presumably due to marked atrophy. No inflammation. Spleen: Normal in size without focal abnormality. Adrenals/Urinary Tract: Adrenal glands are normal. Kidneys show no hydronephrosis. Renal cysts for which no imaging follow-up is recommended. Mild left greater than right perinephric stranding. Possible areas of cortical hypoenhancement involving the kidneys on delayed views but limited by artifact from the patient's arms. There is also motion degradation. The bladder is unremarkable Stomach/Bowel: The stomach is nonenlarged. There is no dilated small bowel. No acute bowel Evora thickening. Diverticular disease of the colon. Vascular/Lymphatic: Mild aortic atherosclerosis. No aneurysm. No suspicious lymph nodes. Gastroesophageal varices. Collateral vessels in the left upper quadrant. Reproductive: Prostate is unremarkable. Other: Negative for pelvic effusion or free air. Mild skin thickening of the right anterior abdominal Golebiewski Musculoskeletal: Degenerative changes. No acute or suspicious osseous abnormality. IMPRESSION: 1. No CT evidence for acute intrathoracic abnormality. 2. Liver cirrhosis with evidence of portal  hypertension including gastroesophageal varices. 3. Diverticular disease of the colon without acute inflammatory process. 4. Mild left greater than right perinephric stranding with possible areas of cortical hypoenhancement involving the kidneys on delayed views but limited by artifact from the patient's arms and motion degradation. Correlate with urinalysis to assess for UTI/pyelonephritis Aortic Atherosclerosis (ICD10-I70.0). Electronically Signed   By: Jasmine Pang M.D.   On: 09/19/2023 21:24   DG Chest Portable 1 View Result Date: 09/19/2023 CLINICAL DATA:  Generalized weakness and fever today. EXAM: PORTABLE CHEST 1 VIEW COMPARISON:  03/10/2022 FINDINGS: The cardiac silhouette remains mildly enlarged. Decreased prominence of the pulmonary vasculature. Mildly prominent interstitial markings with an appearance compatible with chronic interstitial lung disease without superimposed pulmonary edema. No airspace consolidation or pleural fluid. Left shoulder prosthesis. Lower thoracic and upper lumbar spine degenerative changes. IMPRESSION: 1. No acute abnormality. 2. Mild cardiomegaly and mild chronic interstitial lung disease. Electronically Signed   By: Beckie Salts M.D.   On: 09/19/2023 18:24    EKG:  Personally reviewed Sinus rhythm, PVC, RBBB, QTc 500 not corrected for bundle branch, minimal elevation in aVR  ED Course:  Treated with vancomycin, cefepime, Flagyl, 1 L IV fluid, Tylenol, PPI   Assessment/Plan:  73 y.o. male with hx type 1 diabetes, A-fib on Eliquis, cirrhosis, seizure disorder, aortic stenosis, hypertension, peptic ulcer disease, OA with bilateral knee replacement, polyclonal gammopathy, OSA on CPAP, who was brought in from home due to progressive encephalopathy.  Found to be  septic with unknown source, clinical evaluation concerning for possible stroke as well.  Sepsis, unknown source ?? Syndrome c/w septic emboli, ? Endocarditis  No localizing symptoms other than AMS.  febrile on 1/21 to 101F at home.  On initial ED evaluation febrile to 39.7 max, tachycardic in the 120s, tachypneic in the mid 20s, borderline BP 90s over 60s.  Lactate 2.3 -> 1.8 with IV fluids.  WBC rising to 13 this morning.  UA not consistent with infection.  CT chest abdomen pelvis with left greater than right perinephric stranding, possible areas of hypoenhancement on delayed views but limited by artifact and motion.  No other acute changes on imaging.  Somewhat concerned with his constellation of sepsis, possible hypoattenuating lesions in the kidneys and focal neurologic deficit that we may be seeing septic emboli in multiple locations possibly endocarditis. He does have an underlying history of moderate aortic stenosis, had been getting iron infusions recently possibly inoculation through the skin. -Continue vancomycin pharmacy to dose, cefepime 2 g IV every 8 hours, added Flagyl 500 mg IV every 12 hours  -Status post 1 L IV fluid in the ED, given additional 500 cc.  Continue maintenance LR at 100 cc an hour until able to take p.o. -Follow-up blood cultures, check RVP -With suspicion for endocarditis obtain TTE  Right-sided weakness On my initial exam noted to have right facial droop, RUE, RLE weakness.  LKWT was 1/20 in the evening.  He is out of window for stroke treatments.  No known history of stroke per wife and no deficits at baseline.  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 - MRI brain without contrast to evaluate for stroke, if confirmed concern may represent septic emboli - Serial neurochecks - Telemonitoring - Permissive hypertension until MRI result - Hold anticoagulation for now - Serial neurochecks - Swallow eval before diet - Additional workup, neuro involvement pending result of MRI.  Progressive encephalopathy Since 1/21 AM more somnolent, difficulty with executive function, developing  disorientation prompting wife to call EMS.  At baseline independent.  DDx stroke, metabolic in setting of sepsis most likely.  Will also eval hyperammonemia, hypercarbia, developing DKA. Consider seizure if addl eval neg.  - Stroke evaluation per above - Check ammonia, VBG, beta hydroxybutyrate - If MRI negative and above unrevealing consider EEG  Question of hypodense lesions in kidneys - Consider renal infarct, Spoke with Radiologist on call who reviewed imaging and feels that renal arteries are well opacified to the level of renal sinus. The hypodense lesions are not well demarcated wedge typical of classic renal infarcts. So at this point indeterminate lesions. Will hold off on CTA considering renal arteries with no large embolism / dissection etc to explain.   Acute myocardial injury No known history of chest pain, EKG equivocal possible ischemic change with minimal aVR elevation.  High-sensitivity troponin 28 -> 35.  Suspect demand from sepsis - Management directed at underlying sepsis per above  Acute liver injury, hepatocellular pattern Minimal elevation AST 64, ALT 61.  CT without acute hepatobiliary pathology.  Suspect this is secondary to underlying sepsis - Trend LFT  Chronic medical problems: Type 1 diabetes: Note UA positive ketone, no anion gap.  Blood gas, beta hydroxybutyrate pending.  Takes a variable amount of insulin at home, recent PCP note from 1/10 noting decrease in glargine to 40 units daily, also takes sliding scale.  Reduce dose Semglee here 30 units daily, aspart 5 units 3  times daily with meals scheduled, sliding scale for moderate.  A-fib: Hold home Eliquis pending stroke evaluation, hold diltiazem in setting of sepsis/borderline pressures History of cirrhosis: Check ammonia to rule out HE. Trending liver enzymes. Previously compensated. Hx Portal HTN gastropathy / GAVE, no varices on last EGD. No ascites.  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  Body mass index is 31.84 kg/m.  Obesity class I, benefit from continued weight loss.  DVT prophylaxis:   Eliquis on hold for now, SCD Code Status:  Full Code Diet:  Diet Orders (From admission, onward)     Start     Ordered   09/20/23 0037  Diet NPO time specified Except for: Sips with Meds, Ice Chips  Diet effective now       Question Answer Comment  Except for Sips with Meds   Except for Ice Chips      09/20/23 0038           Family Communication: Yes I spoke with his wife Olegario Messier over the phone Consults: None Admission status:   Inpatient, Step Down Unit  Severity of Illness: The appropriate patient status for this patient is INPATIENT. Inpatient status is judged to be reasonable and necessary in order to provide the required intensity of service to ensure the patient's safety. The patient's presenting symptoms, physical exam findings, and initial radiographic and laboratory data in the context of their chronic comorbidities is felt to place them at high risk for further clinical deterioration. Furthermore, it is not anticipated that the patient will be medically stable for discharge from the hospital within 2 midnights of admission.   * I certify that at the point of admission it is my clinical judgment that the patient will require inpatient hospital care spanning beyond 2 midnights from the point of admission due to  high intensity of service, high risk for further deterioration and high frequency of surveillance required.*   Dolly Rias, MD [facility]  How to contact the [facility] Attending or Consulting provider 7A - 7P or covering provider during after hours 7P -7A, for this patient.  Check the care team in [facility] and look for a) attending/consulting [facility] provider listed and b) the [facility] team listed Log into [email or web address] and use [facility]'s universal password to access. If you do not have the password, please contact the hospital operator. Locate the [facility] provider you are looking for under [facility] and page to a number that you can be directly reached. If you still have difficulty reaching the provider, please page the [facility] (Director on Call) for the Hospitalists listed on amion for assistance.  09/20/2023, 3:52 AM

## 2023-09-20 NOTE — ED Notes (Signed)
Messaged provider about patient not passing swallow screen x2.

## 2023-09-20 NOTE — ED Notes (Signed)
Date and time results received: 09/20/23 0900  (use smartphrase ".now" to insert current time)  Test: Blood Cultures Critical Value: Both sites aerobic bottles growing gram positive cocci   Name of Provider Notified: Sherryll Burger  Orders Received? Or Actions Taken?: Orders Received - See Orders for details

## 2023-09-20 NOTE — ED Notes (Signed)
Ultrasound at bedside

## 2023-09-20 NOTE — Progress Notes (Signed)
SLP Cancellation Note  Patient Details Name: Tony Cantu MRN: [id number] DOB: May 18, 1951   Cancelled treatment:       Reason Eval/Treat Not Completed: Patient at procedure or test/unavailable. Pt is OTF for MRI. ST will continue efforts.  Marte Celani H. Romie Levee, CCC-SLP Speech Language Pathologist    Georgetta Haber 09/20/2023, 2:35 PM

## 2023-09-20 NOTE — Progress Notes (Signed)
PROGRESS NOTE    Tony Cantu  ZOX:[id number] DOB: 11/12/1950 DOA: 09/19/2023 PCP: [name], DO   Brief Narrative:    Tony Cantu is a 73 y.o. male with hx of type 1 diabetes, A-fib on Eliquis, cirrhosis, seizure disorder, aortic stenosis, hypertension, peptic ulcer disease, OA with bilateral knee replacement, polyclonal gammopathy, OSA on CPAP, who was brought in from home due to progressive encephalopathy.  He was admitted for sepsis, POA, with unknown source, but is now noted to have gram-positive bacteremia.  He is also noted to have associated acute metabolic encephalopathy.  Assessment & Plan:   Principal Problem:   Sepsis (HCC) Active Problems:   Stroke-like symptom   Acute encephalopathy   Transaminitis   Renal lesion  Assessment and Plan:   Sepsis, unknown source ?? Syndrome c/w septic emboli, ? Endocarditis, with gram-positive bacteremia No localizing symptoms other than AMS. febrile on 1/21 to 101F at home.  On initial ED evaluation febrile to 39.7 max, tachycardic in the 120s, tachypneic in the mid 20s, borderline BP 90s over 60s.  Lactate 2.3 -> 1.8 with IV fluids.  WBC rising to 13 this morning.  UA not consistent with infection.  CT chest abdomen pelvis with left greater than right perinephric stranding, possible areas of hypoenhancement on delayed views but limited by artifact and motion.  No other acute changes on imaging.  Somewhat concerned with his constellation of sepsis, possible hypoattenuating lesions in the kidneys and focal neurologic deficit that we may be seeing septic emboli in multiple locations possibly endocarditis. He does have an underlying history of moderate aortic stenosis, had been getting iron infusions recently possibly inoculation through the skin. -Continue vancomycin pharmacy to dose, cefepime 2 g IV every 8 hours, added Flagyl 500 mg IV every 12 hours    -Status post 1 L IV fluid in the ED, given additional 500 cc.  Continue  maintenance LR at 100 cc an hour until able to take p.o. -Follow-up blood cultures, check RVP -With suspicion for endocarditis obtain TTE -MRSA PCR pending -Respiratory viral panel ordered and pending   Right-sided weakness On my initial exam noted to have right facial droop, RUE, RLE weakness.  LKWT was 1/20 in the evening.  He is out of window for stroke treatments.  No known history of stroke per wife and no deficits at baseline.  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 - MRI brain without contrast to evaluate for stroke, if confirmed concern may represent septic emboli - Serial neurochecks - Telemonitoring - Permissive hypertension until MRI result - Hold anticoagulation for now - Serial neurochecks - Swallow eval before diet - Additional workup, neuro involvement pending result of MRI.   Progressive encephalopathy Since 1/21 AM more somnolent, difficulty with executive function, developing disorientation prompting wife to call EMS.  At baseline independent.  DDx stroke, metabolic in setting of sepsis most likely.  Will also eval hyperammonemia, hypercarbia, developing DKA. Consider seizure if addl eval neg.  - Stroke evaluation per above - Check ammonia, VBG, beta hydroxybutyrate - If MRI negative and above unrevealing consider EEG  Melena/worsening anemia -Continue PPI twice daily -Continue to hold anticoagulation with last dose of Eliquis Monday night -Appreciate GI evaluation -Trend CBC and transfuse for hemoglobin less than 7   Question of hypodense lesions in kidneys - Consider renal infarct, Spoke with Radiologist on call who reviewed imaging and feels that renal arteries are well opacified  to the level of renal sinus. The hypodense lesions are not well demarcated wedge typical of classic renal infarcts. So at this point indeterminate lesions. Will hold off on CTA considering renal arteries  with no large embolism / dissection etc to explain.    Acute myocardial injury No known history of chest pain, EKG equivocal possible ischemic change with minimal aVR elevation.  High-sensitivity troponin 28 -> 35.  Suspect demand from sepsis - Management directed at underlying sepsis per above   Acute liver injury, hepatocellular pattern Minimal elevation AST 64, ALT 61.  CT without acute hepatobiliary pathology.  Suspect this is secondary to underlying sepsis - Trend LFT   Chronic medical problems: Type 1 diabetes: Note UA positive ketone, no anion gap.  Blood gas, beta hydroxybutyrate pending.  Takes a variable amount of insulin at home, recent PCP note from 1/10 noting decrease in glargine to 40 units daily, also takes sliding scale.  Reduce dose Semglee here 30 units daily, aspart 5 units 3 times daily with meals scheduled, sliding scale for moderate.  A-fib: Hold home Eliquis pending stroke evaluation, hold diltiazem in setting of sepsis/borderline pressures History of cirrhosis: Check ammonia to rule out HE. Trending liver enzymes. Previously compensated. Hx Portal HTN gastropathy / GAVE, no varices on last EGD. No ascites.  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   Body mass index is 31.84 kg/m.  Obesity class I, benefit from continued weight loss.    DVT prophylaxis:SCDs, Eliquis held for now Code Status: Full Family Communication: Wife at  bedside 1/22 Disposition Plan:  Status is: Inpatient Remains inpatient appropriate because: Need for IV medications.  Consultants:  GI  Procedures:  None  Antimicrobials:  Anti-infectives (From admission, onward)    Start     Dose/Rate Route Frequency Ordered Stop   09/20/23 2200  vancomycin (VANCOREADY) IVPB 1500 mg/300 mL        1,500 mg 150 mL/hr over 120 Minutes Intravenous Every 24 hours 09/20/23 1020     09/20/23 0600  ceFEPIme (MAXIPIME) 2 g in sodium chloride 0.9 % 100 mL IVPB        2 g 200 mL/hr over 30 Minutes Intravenous Every 8 hours 09/20/23 0038     09/20/23 0045  metroNIDAZOLE (FLAGYL) IVPB 500 mg        500 mg 100 mL/hr over 60 Minutes Intravenous Every 12 hours 09/20/23 0033     09/19/23 2200  vancomycin (VANCOREADY) IVPB 2000 mg/400 mL        2,000 mg 200 mL/hr over 120 Minutes Intravenous  Once 09/19/23 2146 09/20/23 0103   09/19/23 2145  ceFEPIme (MAXIPIME) 2 g in sodium chloride 0.9 % 100 mL IVPB        2 g 200 mL/hr over 30 Minutes Intravenous  Once 09/19/23 2143 09/19/23 2247      Subjective: Patient seen and evaluated today with no new acute complaints or concerns. No acute concerns or events noted overnight.  He is currently somnolent and continues to have elevated temperatures.  Wife at bedside.  Objective: Vitals:   09/20/23 0900 09/20/23 0930 09/20/23 1000 09/20/23 1011  BP: (!) 114/57 (!) 114/50 (!) 109/52   Pulse: 77 79 78   Resp: 19 19    Temp:    (!) 102.2 F (39 C)  TempSrc:    Tympanic  SpO2: 98% 97% 97%   Weight:      Height:        Intake/Output Summary (Last 24 hours) at 09/20/2023 1304 Last data filed at 09/20/2023 0416 Gross per 24 hour  Intake 1698.55 ml  Output --  Net 1698.55 ml   Filed Weights   09/19/23 1709  Weight: 95 kg    Examination:  General exam: Somnolent but arousable Respiratory system: Clear to auscultation. Respiratory effort normal. Cardiovascular system: S1 & S2 heard, RRR.  Gastrointestinal  system: Abdomen is soft Central nervous system: Somnolent but arousable Extremities: No edema Skin: No significant lesions noted Psychiatry: Flat affect.    Data Reviewed: I have personally reviewed following labs and imaging studies  CBC: Recent Labs  Lab 09/19/23 1848 09/20/23 0258  WBC 8.9 13.4*  NEUTROABS 7.8*  --   HGB 10.2* 10.2*  HCT 31.4* 32.1*  MCV 111.7* 114.2*  PLT 126* 105*   Basic Metabolic Panel: Recent Labs  Lab 09/19/23 1848 09/20/23 0258  NA 140 141  K 3.9 4.3  CL 109 111  CO2 20* 20*  GLUCOSE 161* 266*  BUN 14 15  CREATININE 0.98 1.13  CALCIUM 9.2 8.8*  MG  --  1.6*  PHOS  --  3.2   GFR: Estimated Creatinine Clearance: 65.1 mL/min (by C-G formula based on SCr of 1.13 mg/dL). Liver Function Tests: Recent Labs  Lab 09/19/23 1848 09/20/23 0258  AST 64* 65*  ALT 61* 54*  ALKPHOS 104 80  BILITOT 1.2 1.6*  PROT 6.6 6.1*  ALBUMIN 3.1* 2.7*   No results for input(s): "LIPASE", "AMYLASE" in the last 168 hours. Recent Labs  Lab 09/20/23 0532  AMMONIA 13   Coagulation Profile: No results for input(s): "INR", "PROTIME" in the last 168 hours. Cardiac Enzymes: No results for input(s): "CKTOTAL", "CKMB", "CKMBINDEX", "TROPONINI" in the last 168 hours. BNP (last 3 results) No results for input(s): "PROBNP" in the last 8760 hours. HbA1C: Recent Labs    09/19/23 1848  HGBA1C 4.4*   CBG: Recent Labs  Lab 09/20/23 0801 09/20/23 1155  GLUCAP 300* 262*   Lipid Profile: No results for input(s): "CHOL", "HDL", "LDLCALC", "TRIG", "CHOLHDL", "LDLDIRECT" in the last 72 hours. Thyroid Function Tests: No results for input(s): "TSH", "T4TOTAL", "FREET4", "T3FREE", "THYROIDAB" in the last 72 hours. Anemia Panel: No results for input(s): "VITAMINB12", "FOLATE", "FERRITIN", "TIBC", "IRON", "RETICCTPCT" in the last 72 hours. Sepsis Labs: Recent Labs  Lab 09/19/23 1848 09/19/23 2045  LATICACIDVEN 2.3* 1.8    Recent Results (from the past 240  hours)  Blood culture (routine x 2)     Status: None (Preliminary result)   Collection Time: 09/19/23  6:48 PM   Specimen: BLOOD LEFT HAND  Result Value Ref Range Status   Specimen Description   Final    BLOOD LEFT HAND Performed at [facility], [address], [place], [place] [address]    Special Requests   Final    BOTTLES DRAWN AEROBIC ONLY Blood Culture results may not be optimal due to an inadequate volume of blood received in culture bottles   Culture  Setup Time   Final    GRAM POSITIVE  COCCI AEROBIC BOTTLE ONLY Gram Stain Report Called to,Read Back By and Verified With: KRISTY EDWARDS AT 0859 09/20/2023 BY Kandice Moos Performed at [facility], [address] [place], [place] [address]    Culture PENDING  Incomplete   Report Status PENDING  Incomplete  Blood culture (routine x 2)     Status: None (Preliminary result)   Collection Time: 09/19/23  6:48 PM   Specimen: BLOOD LEFT HAND  Result Value Ref Range Status   Specimen Description   Final    BLOOD LEFT HAND Performed at [facility], [address] [place], [place] [address]    Special Requests   Final    BOTTLES DRAWN AEROBIC ONLY Blood Culture results may not be optimal due to an inadequate volume of blood received in culture bottles   Culture  Setup Time   Final    GRAM POSITIVE COCCI AEROBIC BOTTLE ONLY Gram Stain Report Called to,Read Back By and Verified With: KRISTY EDWARDS AT 0859 09/20/2023 BY Kandice Moos Performed at [facility], [address] [place], [place] [address]    Culture PENDING  Incomplete   Report Status PENDING  Incomplete  Resp panel by RT-PCR (RSV, Flu A&B, Covid) Anterior Nasal Swab     Status: None   Collection Time: 09/19/23  7:30 PM   Specimen: Anterior Nasal Swab  Result Value Ref Range Status   SARS Coronavirus 2 by RT PCR NEGATIVE NEGATIVE Final    Comment: (NOTE) SARS-CoV-2 target nucleic acids are NOT DETECTED.  The SARS-CoV-2 RNA is generally detectable in upper  respiratory specimens during the acute phase of infection. The lowest concentration of SARS-CoV-2 viral copies this assay can detect is 138 copies/mL. A negative result does not preclude SARS-Cov-2 infection and should not be used as the sole basis for treatment or other patient management decisions. A negative result may occur with  improper specimen collection/handling, submission of specimen other than nasopharyngeal swab, presence of viral mutation(s) within the areas targeted by this assay, and inadequate number of viral copies(<138 copies/mL). A negative result must be combined with clinical observations, patient history, and epidemiological information. The expected result is Negative.  Fact Sheet for Patients:  BloggerCourse.com  Fact Sheet for Healthcare Providers:  SeriousBroker.it  This test is no t yet approved or cleared by the [place] FDA and  has been authorized for detection and/or diagnosis of SARS-CoV-2 by FDA under an Emergency Use Authorization (EUA). This EUA will remain  in effect (meaning this test can be used) for the duration of the COVID-19 declaration under Section 564(b)(1) of the Act, 21 U.S.C.section 360bbb-3(b)(1), unless the authorization is terminated  or revoked sooner.       Influenza A by PCR NEGATIVE NEGATIVE Final   Influenza B by PCR NEGATIVE NEGATIVE Final    Comment: (NOTE) The Xpert Xpress SARS-CoV-2/FLU/RSV plus assay is intended as an aid in the diagnosis of influenza from Nasopharyngeal swab specimens and should not be used as a sole basis for treatment. Nasal washings and aspirates are unacceptable for Xpert Xpress SARS-CoV-2/FLU/RSV testing.  Fact Sheet for Patients: BloggerCourse.com  Fact Sheet for Healthcare Providers: SeriousBroker.it  This test is not yet approved or cleared by the [place] FDA and has been  authorized for detection and/or diagnosis of SARS-CoV-2 by FDA under an Emergency Use Authorization (EUA). This EUA will remain in effect (meaning this test can be used) for the duration of the COVID-19 declaration under Section 564(b)(1) of the Act, 21  U.S.C. section 360bbb-3(b)(1), unless the authorization is terminated or revoked.     Resp Syncytial Virus by PCR NEGATIVE NEGATIVE Final    Comment: (NOTE) Fact Sheet for Patients: BloggerCourse.com  Fact Sheet for Healthcare Providers: SeriousBroker.it  This test is not yet approved or cleared by the [place] FDA and has been authorized for detection and/or diagnosis of SARS-CoV-2 by FDA under an Emergency Use Authorization (EUA). This EUA will remain in effect (meaning this test can be used) for the duration of the COVID-19 declaration under Section 564(b)(1) of the Act, 21 U.S.C. section 360bbb-3(b)(1), unless the authorization is terminated or revoked.  Performed at [facility], [address] [place], [place] [address]          Radiology Studies: ECHOCARDIOGRAM COMPLETE BUBBLE STUDY Result Date: 09/20/2023    ECHOCARDIOGRAM REPORT   Patient Name:   Tony Cantu Date of Exam: 09/20/2023 Medical Rec #:  [id number]      Height:       68.0 in Accession #:    [id number]     Weight:       209.4 lb Date of Birth:  [birth year]       BSA:          2.084 m Patient Age:    72 years       BP:           99/57 mmHg Patient Gender: M              HR:           66 bpm. [facility] Procedure: 2D Echo, Cardiac Doppler and Color Doppler STAT ECHO Indications:    Sepsis  History:        Patient has prior history of Echocardiogram examinations, most                 recent 09/22/2021. COPD, Arrythmias:Atrial Fibrillation and                 Tachycardia; Risk Factors:Hypertension, Diabetes, Dyslipidemia                 and Former Smoker.  Sonographer:    Vern Claude Referring Phys:  [id number] JONATHAN SEGARS IMPRESSIONS  1. The aortic valve is calcified. Possibly tricuspid. There is severe calcifcation of the aortic valve. There is severe thickening of the aortic valve. Aortic valve regurgitation is trivial. Severe aortic valve stenosis. Aortic valve area, by VTI measures 0.89 cm. Aortic valve mean gradient measures 46.3 mmHg. Aortic valve Vmax measures 4.55 m/s. DVI is 0.27.  2. Left ventricular ejection fraction, by estimation, is 55 to 60%. The left ventricle has normal function. Left ventricular endocardial border not optimally defined to evaluate regional [place] motion. The left ventricular internal cavity size was mildly to moderately dilated. Left ventricular diastolic parameters are consistent with Grade I diastolic dysfunction (impaired relaxation).  3. Right ventricular systolic function is normal. The right ventricular size is normal. Tricuspid regurgitation signal is inadequate for assessing PA pressure.  4. The mitral valve is abnormal. Trivial mitral valve regurgitation. Mild mitral stenosis. The mean mitral valve gradient is 3.0 mmHg. Moderate mitral annular calcification.  5. The inferior vena cava is normal in size with greater than 50% respiratory variability, suggesting right atrial pressure of 3 mmHg. Comparison(s): Changes from prior study are noted. Aortic valve stensois progressed from moderate (in 2023) to severe now. V max is 4.55 m/s and mean PG 46.3 mm Hg. FINDINGS  Left  Ventricle: Left ventricular ejection fraction, by estimation, is 55 to 60%. The left ventricle has normal function. Left ventricular endocardial border not optimally defined to evaluate regional [place] motion. The left ventricular internal cavity size was mildly to moderately dilated. There is no left ventricular hypertrophy. Left ventricular diastolic parameters are consistent with Grade I diastolic dysfunction (impaired relaxation). Normal left ventricular filling pressure. Right Ventricle: The right  ventricular size is normal. No increase in right ventricular Hollings thickness. Right ventricular systolic function is normal. Tricuspid regurgitation signal is inadequate for assessing PA pressure. Left Atrium: Left atrial size was normal in size. Right Atrium: Right atrial size was normal in size. Pericardium: There is no evidence of pericardial effusion. Mitral Valve: The mitral valve is abnormal. Moderate mitral annular calcification. Trivial mitral valve regurgitation. Mild mitral valve stenosis. MV peak gradient, 5.8 mmHg. The mean mitral valve gradient is 3.0 mmHg. Tricuspid Valve: The tricuspid valve is grossly normal. Tricuspid valve regurgitation is not demonstrated. No evidence of tricuspid stenosis. Aortic Valve: The aortic valve is calcified. There is severe calcifcation of the aortic valve. There is severe thickening of the aortic valve. Aortic valve regurgitation is trivial. Severe aortic stenosis is present. Aortic valve mean gradient measures 46.3 mmHg. Aortic valve peak gradient measures 82.9 mmHg. Aortic valve area, by VTI measures 0.89 cm. Pulmonic Valve: The pulmonic valve was not well visualized. Pulmonic valve regurgitation is trivial. No evidence of pulmonic stenosis. Aorta: The aortic root and ascending aorta are structurally normal, with no evidence of dilitation. Venous: The inferior vena cava is normal in size with greater than 50% respiratory variability, suggesting right atrial pressure of 3 mmHg. IAS/Shunts: The interatrial septum was not well visualized.  LEFT VENTRICLE PLAX 2D LVIDd:         6.00 cm      Diastology LVIDs:         4.20 cm      LV e' medial:    5.11 cm/s LV PW:         0.80 cm      LV E/e' medial:  19.3 LV IVS:        0.90 cm      LV e' lateral:   8.38 cm/s LVOT diam:     2.00 cm      LV E/e' lateral: 11.8 LV SV:         80 LV SV Index:   38 LVOT Area:     3.14 cm  LV Volumes (MOD) LV vol d, MOD A2C: 186.0 ml LV vol d, MOD A4C: 182.0 ml LV vol s, MOD A2C: 72.2 ml LV vol  s, MOD A4C: 63.4 ml LV SV MOD A2C:     113.8 ml LV SV MOD A4C:     182.0 ml LV SV MOD BP:      119.1 ml RIGHT VENTRICLE             IVC RV Basal diam:  3.90 cm     IVC diam: 1.00 cm RV Mid diam:    2.50 cm RV S prime:     15.20 cm/s TAPSE (M-mode): 3.1 cm LEFT ATRIUM             Index        RIGHT ATRIUM           Index LA diam:        3.70 cm 1.78 cm/m   RA Area:     14.40 cm LA Vol (A2C):  91.6 ml 43.95 ml/m  RA Volume:   35.40 ml  16.99 ml/m LA Vol (A4C):   44.6 ml 21.40 ml/m LA Biplane Vol: 69.0 ml 33.11 ml/m  AORTIC VALVE                     PULMONIC VALVE AV Area (Vmax):    0.87 cm      PV Vmax:       0.98 m/s AV Area (Vmean):   0.86 cm      PV Peak grad:  3.8 mmHg AV Area (VTI):     0.89 cm AV Vmax:           455.33 cm/s AV Vmean:          310.000 cm/s AV VTI:            0.896 m AV Peak Grad:      82.9 mmHg AV Mean Grad:      46.3 mmHg LVOT Vmax:         126.00 cm/s LVOT Vmean:        85.000 cm/s LVOT VTI:          0.255 m LVOT/AV VTI ratio: 0.28  AORTA Ao Root diam: 3.20 cm Ao Asc diam:  3.20 cm MITRAL VALVE MV Area (PHT): 3.26 cm     SHUNTS MV Area VTI:   2.27 cm     Systemic VTI:  0.26 m MV Peak grad:  5.8 mmHg     Systemic Diam: 2.00 cm MV Mean grad:  3.0 mmHg MV Vmax:       1.20 m/s MV Vmean:      82.0 cm/s MV Decel Time: 233 msec MV E velocity: 98.80 cm/s MV A velocity: 118.00 cm/s MV E/A ratio:  0.84 Vishnu Priya Mallipeddi Electronically signed by Winfield Rast Mallipeddi Signature Date/Time: 09/20/2023/9:56:59 AM    Final    CT HEAD WO CONTRAST ( ) Result Date: 09/20/2023 CLINICAL DATA:  Encephalopathy, right-sided weakness EXAM: CT HEAD WITHOUT CONTRAST TECHNIQUE: Contiguous axial images were obtained from the base of the skull through the vertex without intravenous contrast. RADIATION DOSE REDUCTION: This exam was performed according to the departmental dose-optimization program which includes automated exposure control, adjustment of the mA and/or kV according to patient size and/or  use of iterative reconstruction technique. COMPARISON:  None Available. FINDINGS: Brain: Normal anatomic configuration of the brain. Mild periventricular white matter changes are present likely the sequela small vessel ischemia. Tiny remote lacunar infarct noted within the right frontal subcortical white matter. No acute intracranial hemorrhage or infarct. No mass effect or midline shift no abnormal intra or extra-axial mass lesion. Ventricular size is normal. Cerebellum is unremarkable. Vascular: No hyperdense vessel or unexpected calcification. Skull: Normal. Negative for fracture or focal lesion. Sinuses/Orbits: No acute finding. Other: Mastoid air cells and middle ear cavities are clear. IMPRESSION: 1. No acute intracranial abnormality. 2. Mild senescent change. 3. Tiny remote lacunar infarct within the right frontal subcortical white matter. Electronically Signed   By: Helyn Numbers M.D.   On: 09/20/2023 01:45   CT CHEST ABDOMEN PELVIS W CONTRAST Result Date: 09/19/2023 CLINICAL DATA:  Sepsis fever EXAM: CT CHEST, ABDOMEN, AND PELVIS WITH CONTRAST TECHNIQUE: Multidetector CT imaging of the chest, abdomen and pelvis was performed following the standard protocol during bolus administration of intravenous contrast. RADIATION DOSE REDUCTION: This exam was performed according to the departmental dose-optimization program which includes automated exposure control, adjustment of the mA and/or kV according to patient  size and/or use of iterative reconstruction technique. CONTRAST:  OMNIPAQUE IOHEXOL 300 MG/ML  SOLN COMPARISON:  Chest x-ray 09/19/2023, CT 04/28/2021, 02/20/2019 FINDINGS: CT CHEST FINDINGS Cardiovascular: Mild aortic atherosclerosis. No aneurysm. Mitral annular calcification. Aortic valvular calcification. Borderline cardiomegaly. No pericardial effusion Mediastinum/Nodes: Patent trachea. No thyroid mass. No suspicious lymph nodes. Esophagus within normal limits Lungs/Pleura: No acute airspace  disease, pleural effusion or pneumothorax. Musculoskeletal: Sternum appears intact. There are degenerative changes of the spine. No acute osseous abnormality CT ABDOMEN PELVIS FINDINGS Hepatobiliary: Liver cirrhosis. No calcified gallstone or biliary dilatation Pancreas: Poorly visible, presumably due to marked atrophy. No inflammation. Spleen: Normal in size without focal abnormality. Adrenals/Urinary Tract: Adrenal glands are normal. Kidneys show no hydronephrosis. Renal cysts for which no imaging follow-up is recommended. Mild left greater than right perinephric stranding. Possible areas of cortical hypoenhancement involving the kidneys on delayed views but limited by artifact from the patient's arms. There is also motion degradation. The bladder is unremarkable Stomach/Bowel: The stomach is nonenlarged. There is no dilated small bowel. No acute bowel Tsao thickening. Diverticular disease of the colon. Vascular/Lymphatic: Mild aortic atherosclerosis. No aneurysm. No suspicious lymph nodes. Gastroesophageal varices. Collateral vessels in the left upper quadrant. Reproductive: Prostate is unremarkable. Other: Negative for pelvic effusion or free air. Mild skin thickening of the right anterior abdominal [place] Musculoskeletal: Degenerative changes. No acute or suspicious osseous abnormality. IMPRESSION: 1. No CT evidence for acute intrathoracic abnormality. 2. Liver cirrhosis with evidence of portal hypertension including gastroesophageal varices. 3. Diverticular disease of the colon without acute inflammatory process. 4. Mild left greater than right perinephric stranding with possible areas of cortical hypoenhancement involving the kidneys on delayed views but limited by artifact from the patient's arms and motion degradation. Correlate with urinalysis to assess for UTI/pyelonephritis Aortic Atherosclerosis (ICD10-I70.0). Electronically Signed   By: Jasmine Pang M.D.   On: 09/19/2023 21:24   DG Chest Portable 1  View Result Date: 09/19/2023 CLINICAL DATA:  Generalized weakness and fever today. EXAM: PORTABLE CHEST 1 VIEW COMPARISON:  03/10/2022 FINDINGS: The cardiac silhouette remains mildly enlarged. Decreased prominence of the pulmonary vasculature. Mildly prominent interstitial markings with an appearance compatible with chronic interstitial lung disease without superimposed pulmonary edema. No airspace consolidation or pleural fluid. Left shoulder prosthesis. Lower thoracic and upper lumbar spine degenerative changes. IMPRESSION: 1. No acute abnormality. 2. Mild cardiomegaly and mild chronic interstitial lung disease. Electronically Signed   By: Beckie Salts M.D.   On: 09/19/2023 18:24        Scheduled Meds:  insulin aspart  0-9 Units Subcutaneous Q4H   insulin glargine-yfgn  10 Units Subcutaneous Daily   pantoprazole (PROTONIX) IV  40 mg Intravenous Q12H   sodium chloride flush  3 mL Intravenous Q12H   Continuous Infusions:  ceFEPime (MAXIPIME) IV Stopped (09/20/23 2130)   lactated ringers 100 mL/hr at 09/20/23 8657   metronidazole 500 mg (09/20/23 1207)   vancomycin       LOS: 0 days    Time spent: 55 minutes    Aiza Vollrath Hoover Brunette, DO [facility]  If 7PM-7AM, please contact night-coverage [email or web address] 09/20/2023, 1:04 PM

## 2023-09-20 NOTE — Progress Notes (Signed)
Pharmacy Antibiotic Note  Tony Cantu is a 73 y.o. male admitted on 09/19/2023 with sepsis.  Pharmacy has been consulted for vancomycin dosing.  Plan: Vancomycin 750 mg IV Q 12 hrs. Goal AUC 400-550.  Expected AUC 540. Also started appropriately on cefepime by admitting MD.  Height: 5\' 8"  (172.7 cm) Weight: 95 kg (209 lb 7 oz) IBW/kg (Calculated) : 68.4  Temp (24hrs), Avg:101.7 F (38.7 C), Min:98.5 F (36.9 C), Max:103.5 F (39.7 C)  Recent Labs  Lab 09/19/23 1848 09/19/23 2045  WBC 8.9  --   CREATININE 0.98  --   LATICACIDVEN 2.3* 1.8    Estimated Creatinine Clearance: 75 mL/min (by C-G formula based on SCr of 0.98 mg/dL).    Allergies  Allergen Reactions   Dimetapp Children's Cold-Cough Other (See Comments)    Chest discomfort    Erythromycin Diarrhea   Sulfonamide Derivatives Diarrhea    Thank you for allowing pharmacy to be a part of this patient's care.  Vernard Gambles, PharmD, BCPS  09/20/2023 12:44 AM

## 2023-09-21 DIAGNOSIS — K7460 Unspecified cirrhosis of liver: Secondary | ICD-10-CM

## 2023-09-21 DIAGNOSIS — D649 Anemia, unspecified: Secondary | ICD-10-CM

## 2023-09-21 DIAGNOSIS — Z8719 Personal history of other diseases of the digestive system: Secondary | ICD-10-CM

## 2023-09-21 DIAGNOSIS — R4182 Altered mental status, unspecified: Secondary | ICD-10-CM | POA: Diagnosis not present

## 2023-09-21 DIAGNOSIS — R7881 Bacteremia: Secondary | ICD-10-CM | POA: Insufficient documentation

## 2023-09-21 DIAGNOSIS — A419 Sepsis, unspecified organism: Secondary | ICD-10-CM | POA: Diagnosis not present

## 2023-09-21 DIAGNOSIS — R6520 Severe sepsis without septic shock: Secondary | ICD-10-CM | POA: Diagnosis not present

## 2023-09-21 DIAGNOSIS — B955 Unspecified streptococcus as the cause of diseases classified elsewhere: Secondary | ICD-10-CM | POA: Diagnosis not present

## 2023-09-21 DIAGNOSIS — G9341 Metabolic encephalopathy: Secondary | ICD-10-CM | POA: Diagnosis not present

## 2023-09-21 LAB — COMPREHENSIVE METABOLIC PANEL
ALT: 80 U/L — ABNORMAL HIGH (ref 0–44)
AST: 164 U/L — ABNORMAL HIGH (ref 15–41)
Albumin: 2.5 g/dL — ABNORMAL LOW (ref 3.5–5.0)
Alkaline Phosphatase: 70 U/L (ref 38–126)
Anion gap: 5 (ref 5–15)
BUN: 18 mg/dL (ref 8–23)
CO2: 23 mmol/L (ref 22–32)
Calcium: 8.8 mg/dL — ABNORMAL LOW (ref 8.9–10.3)
Chloride: 115 mmol/L — ABNORMAL HIGH (ref 98–111)
Creatinine, Ser: 0.95 mg/dL (ref 0.61–1.24)
GFR, Estimated: >60 mL/min (ref >60)
Glucose, Bld: 225 mg/dL — ABNORMAL HIGH (ref 70–99)
Potassium: 3.6 mmol/L (ref 3.5–5.1)
Sodium: 143 mmol/L (ref 135–145)
Total Bilirubin: 0.7 mg/dL (ref 0.0–1.2)
Total Protein: 5.6 g/dL — ABNORMAL LOW (ref 6.5–8.1)

## 2023-09-21 LAB — CBC
HCT: 30.9 % — ABNORMAL LOW (ref 39.0–52.0)
Hemoglobin: 9.8 g/dL — ABNORMAL LOW (ref 13.0–17.0)
MCH: 35.6 pg — ABNORMAL HIGH (ref 26.0–34.0)
MCHC: 31.7 g/dL (ref 30.0–36.0)
MCV: 112.4 fL — ABNORMAL HIGH (ref 80.0–100.0)
Platelets: 87 10*3/uL — ABNORMAL LOW (ref 150–400)
RBC: 2.75 MIL/uL — ABNORMAL LOW (ref 4.22–5.81)
RDW: 18.8 % — ABNORMAL HIGH (ref 11.5–15.5)
WBC: 6.4 10*3/uL (ref 4.0–10.5)
nRBC: 0 % (ref 0.0–0.2)

## 2023-09-21 LAB — GLUCOSE, CAPILLARY
Glucose-Capillary: 190 mg/dL — ABNORMAL HIGH (ref 70–99)
Glucose-Capillary: 193 mg/dL — ABNORMAL HIGH (ref 70–99)
Glucose-Capillary: 213 mg/dL — ABNORMAL HIGH (ref 70–99)
Glucose-Capillary: 222 mg/dL — ABNORMAL HIGH (ref 70–99)
Glucose-Capillary: 250 mg/dL — ABNORMAL HIGH (ref 70–99)
Glucose-Capillary: 265 mg/dL — ABNORMAL HIGH (ref 70–99)

## 2023-09-21 LAB — MAGNESIUM: Magnesium: 2.1 mg/dL (ref 1.7–2.4)

## 2023-09-21 MED ORDER — ENSURE ENLIVE PO LIQD
237.0000 mL | Freq: Two times a day (BID) | ORAL | Status: DC
  Administered 2023-09-23 – 2023-09-27 (×7): 237 mL via ORAL

## 2023-09-21 NOTE — Plan of Care (Signed)
  Problem: Fluid Volume: Goal: Ability to maintain a balanced intake and output will improve Outcome: Progressing   Problem: Health Behavior/Discharge Planning: Goal: Ability to identify and utilize available resources and services will improve Outcome: Progressing Goal: Ability to manage health-related needs will improve Outcome: Progressing   Problem: Metabolic: Goal: Ability to maintain appropriate glucose levels will improve Outcome: Progressing

## 2023-09-21 NOTE — Evaluation (Signed)
Occupational Therapy Evaluation Patient Details Name: Tony Cantu MRN: [id number] DOB: August 28, 1951 Today's Date: 09/21/2023   History of Present Illness Tony Cantu is a 73 y.o. male with hx of type 1 diabetes, A-fib on Eliquis, cirrhosis, seizure disorder, aortic stenosis, hypertension, peptic ulcer disease, OA with bilateral knee replacement, polyclonal gammopathy, OSA on CPAP, who was brought in from home due to progressive encephalopathy.  History is obtained by patient's wife due to patient's altered mentation and somnolence.  At time of interview he is able to wake up and state his name but that is the extent he can do right now.  Per wife who is an Charity fundraiser, was Normal going to bed on Monday (LKWT 1/20 evening). Awoke on Tues 1/21 at 4AM; called out to wife for help getting up to the bathroom which is very unusual. At baseline fully independent with normal mental status, no DME. She checked blood glucose, was high in 200s. Checked temp and had fever to 101 F. More somnolent throughout the day. Wife had been at work, was calling to check / visiting during day. Around 3PM did not answer phone, she came to check on him and he was disoriented to time. Did not seem to have any focal weakness. No prior illness, chills, rigors, cough / cold symptoms, GI symptoms, urinary changes. Had headache on Friday. No hx of known prior stroke or chronic weakness. (per MD)   Clinical Impression   Pt agreeable to OT evaluation but did seem fatigued and did not answer all prior history questions. No family present to confirm pt's baseline cognitive function. Unsure of level of assist pt needed prior. Chart states that pt needed a lot of assist for ADL's from his wife. Pt required mod to max A for sit to stand from the chair where pt was seated at the start of the session. 3-/5 MMT for shoulder flexion with pain noted during P/ROM of B shoulders. Pt was left in the chair with call bell within reach.       If plan is  discharge home, recommend the following: A lot of help with walking and/or transfers;A lot of help with bathing/dressing/bathroom;Assistance with cooking/housework;Assist for transportation;Help with stairs or ramp for entrance    Functional Status Assessment  Patient has had a recent decline in their functional status and demonstrates the ability to make significant improvements in function in a reasonable and predictable amount of time.  Equipment Recommendations  None recommended by OT           Precautions / Restrictions Precautions Precautions: Fall Restrictions Weight Bearing Restrictions Per Provider Order: No      Mobility Bed Mobility               General bed mobility comments: Pt seated in the chair at the start of the session.    Transfers Overall transfer level: Needs assistance Equipment used: Rolling walker (2 wheels) Transfers: Sit to/from Stand Sit to Stand: Max assist, Mod assist           General transfer comment: Pt very unsteady and week; much assist for sit to stand; only able to stand a few seconds before sitting again.      Balance Overall balance assessment: Needs assistance Sitting-balance support: Bilateral upper extremity supported, Feet supported Sitting balance-Leahy Scale: Fair Sitting balance - Comments: seated in chair   Standing balance support: Bilateral upper extremity supported, During functional activity, Reliant on assistive device for balance Standing balance-Leahy Scale: Poor Standing balance  comment: using RW                           ADL either performed or assessed with clinical judgement   ADL Overall ADL's : Needs assistance/impaired     Grooming: Minimal assistance;Set up;Sitting   Upper Body Bathing: Set up;Minimal assistance;Sitting   Lower Body Bathing: Maximal assistance;Sitting/lateral leans   Upper Body Dressing : Sitting;Set up;Minimal assistance   Lower Body Dressing: Maximal  assistance;Sitting/lateral leans   Toilet Transfer: Maximal assistance;Rolling walker (2 wheels);Stand-pivot;Moderate assistance Toilet Transfer Details (indicate cue type and reason): Partially simulated via sit to stand from chair with RW Toileting- Clothing Manipulation and Hygiene: Maximal assistance;Sitting/lateral lean               Vision Baseline Vision/History: 0 No visual deficits (per pt report; image has pt wearing glasses; reports he has plans to see the eye doctor) Ability to See in Adequate Light: 0 Adequate Patient Visual Report: No change from baseline Vision Assessment?: No apparent visual deficits     Perception Perception: Not tested       Praxis Praxis: Not tested       Pertinent Vitals/Pain Pain Assessment Pain Assessment: Faces Faces Pain Scale: Hurts a little bit Pain Location: B shoulder during P/ROM Pain Descriptors / Indicators: Grimacing Pain Intervention(s): Monitored during session, Repositioned, Limited activity within patient's tolerance     Extremity/Trunk Assessment Upper Extremity Assessment Upper Extremity Assessment: Generalized weakness (3-/5 MMT for shoulder flexion. ~75% of available range for P/ROM shoulder flexion as well.)   Lower Extremity Assessment Lower Extremity Assessment: Defer to PT evaluation   Cervical / Trunk Assessment Cervical / Trunk Assessment: Kyphotic   Communication Communication Communication: No apparent difficulties Cueing Techniques: Verbal cues;Tactile cues   Cognition Arousal: Alert Behavior During Therapy: WFL for tasks assessed/performed Overall Cognitive Status: No family/caregiver present to determine baseline cognitive functioning                                 General Comments: Pt not answering prior living questions at time.                      Home Living Family/patient expects to be discharged to:: Private residence Living Arrangements: Spouse/significant  other Available Help at Discharge: Family;Available 24 hours/day Type of Home: House Home Access: Stairs to enter [employer] of Steps: 1+1 Entrance Stairs-Rails: None Home Layout: Able to live on main level with bedroom/bathroom     Bathroom Shower/Tub: Producer, television/film/video: Handicapped height Bathroom Accessibility: Yes   Home Equipment: Agricultural consultant (2 wheels);Cane - single point;Standard Walker;BSC/3in1   Additional Comments: per chart      Prior Functioning/Environment Prior Level of Function : Needs assist       Physical Assist : ADLs (physical);Mobility (physical) Mobility (physical): Bed mobility   Mobility Comments: wife was helping (per PT) ADLs Comments: Pt did not confirm ADL history; chart states that the pt's wife does a lot of his ADL's for him.        OT Problem List: Decreased strength;Decreased range of motion;Decreased activity tolerance;Impaired balance (sitting and/or standing);Decreased cognition      OT Treatment/Interventions: Self-care/ADL training;Therapeutic exercise;Therapeutic activities;Patient/family education    OT Goals(Current goals can be found in the care plan section) Acute Rehab OT Goals Patient Stated Goal: improve function prior to return home OT  Goal Formulation: With patient Time For Goal Achievement: 10/05/23 Potential to Achieve Goals: Good  OT Frequency: Min 2X/week                                   End of Session Equipment Utilized During Treatment: Rolling walker (2 wheels) Nurse Communication: Other (comment) (notified pt was in the chair)  Activity Tolerance: Patient tolerated treatment well Patient left: in chair;with call bell/phone within reach  OT Visit Diagnosis: Unsteadiness on feet (R26.81);Other abnormalities of gait and mobility (R26.89);Muscle weakness (generalized) (M62.81)                Time: 6295-2841 OT Time Calculation (min): 9 min Charges:  OT General  Charges $OT Visit: 1 Visit OT Evaluation $OT Eval Low Complexity: 1 Low  Sibley Rolison OT, MOT  Danie Chandler 09/21/2023, 3:59 PM

## 2023-09-21 NOTE — Progress Notes (Signed)
Responding to a consult, Chaplain went to visit Pt and provided information about how to fill the Advance Care Directives form. Present at the moment of the Chaplain's arrival was his wife. When the Chaplain started providing the information about AD, the wife interrupted to let this Chaplain know that she would step out since Pt has decided to designate as his POA to his ex-wife, Gavin Pound. Chaplain was then able to provide education about the form. Pt requested some time to fill out the form. Chaplain asked Pt to notify this office once he is ready to sign the form, so this office can provide the notary and witnesses to finalize, make the copies and distribute the notarized form. When exiting the unit, Nurse Zollie Scale shared with Chaplain that Pt's ex-wife has some paperwork as Pt's POA that she wanted to use for this process. Chaplain will come back to see if Gavin Pound is in the hospital to help with this document.  Oneida Alar Chaplain Resident    09/21/23 1257  Spiritual Encounters  Type of Visit Initial  Care provided to: Patient  Referral source Clinical staff  Reason for visit Advance directives  OnCall Visit Yes

## 2023-09-21 NOTE — Plan of Care (Signed)
  Problem: Acute Rehab PT Goals(only PT should resolve) Goal: Pt Will Go Supine/Side To Sit Outcome: Progressing Flowsheets (Taken 09/21/2023 1414) Pt will go Supine/Side to Sit:  with moderate assist  with minimal assist Goal: Patient Will Transfer Sit To/From Stand Outcome: Progressing Flowsheets (Taken 09/21/2023 1414) Patient will transfer sit to/from stand:  with minimal assist  with moderate assist Goal: Pt Will Transfer Bed To Chair/Chair To Bed Outcome: Progressing Flowsheets (Taken 09/21/2023 1414) Pt will Transfer Bed to Chair/Chair to Bed:  with min assist  with mod assist Goal: Pt Will Ambulate Outcome: Progressing Flowsheets (Taken 09/21/2023 1414) Pt will Ambulate:  with rolling walker  25 feet  with minimal assist  with moderate assist     Tony Cantu SPT

## 2023-09-21 NOTE — Progress Notes (Signed)
PROGRESS NOTE    Tony Cantu  NWG:[id number] DOB: 1950/10/01 DOA: 09/19/2023 PCP: [name], DO   Brief Narrative:    Tony Cantu is a 73 y.o. male with hx of type 1 diabetes, A-fib on Eliquis, cirrhosis, seizure disorder, aortic stenosis, hypertension, peptic ulcer disease, OA with bilateral knee replacement, polyclonal gammopathy, OSA on CPAP, who was brought in from home due to progressive encephalopathy.  He was admitted for sepsis, POA, with noted strep bacteremia.  He is also noted to have associated acute metabolic encephalopathy.  GI planning for EGD today.  Brain MRI with no acute findings and 2D echocardiogram with no findings of endocarditis.  ID consulted to assist with management due to noted strep bacteremia.  Assessment & Plan:   Principal Problem:   Sepsis (HCC) Active Problems:   Stroke-like symptom   Acute encephalopathy   Transaminitis   Renal lesion   Melena  Assessment and Plan:   Sepsis, POA secondary to strep bacteremia -Continue on Rocephin for strep bacteremia -TTE with no findings of vegetations -Appreciate ID recommendations -Repeat blood cultures negative thus far   Right-sided weakness On my initial exam noted to have right facial droop, RUE, RLE weakness.  LKWT was 1/20 in the evening.  He is out of window for stroke treatments.  No known history of stroke per wife and no deficits at baseline.  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 -SLP evaluation pending - Brain MRI with no acute findings -PT/OT evaluation -Fall precautions   Progressive encephalopathy Since 1/21 AM more somnolent, difficulty with executive function, developing disorientation prompting wife to call EMS.  At baseline independent.  DDx stroke, metabolic in setting of sepsis most likely.  Will also eval hyperammonemia, hypercarbia, developing DKA. Consider seizure if addl eval neg.  -  Stroke evaluation per above - Check ammonia, VBG, beta hydroxybutyrate - If MRI negative and above unrevealing consider EEG  Melena/worsening anemia -Continue PPI twice daily -Continue to hold anticoagulation with last dose of Eliquis Monday night -Appreciate GI evaluation with plan for EGD today -Trend CBC and transfuse for hemoglobin less than 7   Question of hypodense lesions in kidneys - Consider renal infarct, Spoke with Radiologist on call who reviewed imaging and feels that renal arteries are well opacified to the level of renal sinus. The hypodense lesions are not well demarcated wedge typical of classic renal infarcts. So at this point indeterminate lesions. Will hold off on CTA considering renal arteries with no large embolism / dissection etc to explain.    Acute myocardial injury No known history of chest pain, EKG equivocal possible ischemic change with minimal aVR elevation.  High-sensitivity troponin 28 -> 35.  Suspect demand from sepsis - Preserved LVEF and mild Gonet motion abnormality changes noted on 2D echocardiogram -Suspect this can be followed up outpatient, but will likely require TEE during this hospitalization for which further evaluation can be performed at that point.   Acute liver injury, hepatocellular pattern Minimal elevation AST 64, ALT 61.  CT without acute hepatobiliary pathology.  Suspect this is secondary to underlying sepsis - Trend LFT   Chronic medical problems: Type 1 diabetes: Note UA positive ketone, no anion gap.  Blood gas, beta hydroxybutyrate pending.  Takes a variable amount of insulin at home, recent PCP note from 1/10 noting decrease in glargine to 40 units daily, also takes sliding scale.  Reduce dose Semglee here 30 units daily,  aspart 5 units 3 times daily with meals scheduled, sliding scale for moderate.  A-fib: Hold home Eliquis pending stroke evaluation, hold diltiazem in setting of sepsis/borderline pressures History of cirrhosis: Check  ammonia to rule out HE. Trending liver enzymes. Previously compensated. Hx Portal HTN gastropathy / GAVE, no varices on last EGD. No ascites.  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   Body mass index is 31.84 kg/m.  Obesity class I, benefit from continued weight loss.    DVT prophylaxis:SCDs, Eliquis held for now Code Status: Full Family Communication: Wife at bedside 1/23 Disposition Plan:  Status is: Inpatient Remains inpatient appropriate because: Need for IV medications.  Consultants:  GI ID  Procedures:  None  Antimicrobials:  Anti-infectives (From admission, onward)    Start     Dose/Rate Route Frequency Ordered Stop   09/20/23 2200  vancomycin (VANCOREADY) IVPB 1500 mg/300 mL  Status:  Discontinued        1,500 mg 150 mL/hr over 120 Minutes Intravenous Every 24 hours 09/20/23 1020 09/20/23 1529   09/20/23 2200  cefTRIAXone (ROCEPHIN) 2 g in sodium chloride 0.9 % 100 mL IVPB        2 g 200 mL/hr over 30 Minutes Intravenous Every 24 hours 09/20/23 1529     09/20/23 0600  ceFEPIme (MAXIPIME) 2 g in sodium chloride 0.9 % 100 mL IVPB  Status:  Discontinued        2 g 200 mL/hr over 30 Minutes Intravenous Every 8 hours 09/20/23 0038 09/20/23 1529   09/20/23 0045  metroNIDAZOLE (FLAGYL) IVPB 500 mg  Status:  Discontinued        500 mg 100 mL/hr over 60 Minutes Intravenous Every 12 hours 09/20/23 0033 09/20/23 1529   09/19/23 2200   vancomycin (VANCOREADY) IVPB 2000 mg/400 mL        2,000 mg 200 mL/hr over 120 Minutes Intravenous  Once 09/19/23 2146 09/20/23 0103   09/19/23 2145  ceFEPIme (MAXIPIME) 2 g in sodium chloride 0.9 % 100 mL IVPB        2 g 200 mL/hr over 30 Minutes Intravenous  Once 09/19/23 2143 09/19/23 2247      Subjective: Patient seen and evaluated today with no acute events or concerns noted overnight.  Blood pressures have remained stable.  Fever is improving.  Mentation is slowly improving.  Objective: Vitals:   09/21/23 0757 09/21/23 0800 09/21/23 0830 09/21/23 0900  BP:  (!) 123/55 113/62 128/60  Pulse:  76  75  Resp:  16 17 16   Temp: 99.5 F (37.5 C)     TempSrc: Axillary     SpO2:  99%  98%  Weight:      Height:        Intake/Output Summary (Last 24 hours) at 09/21/2023 1013 Last data filed at 09/21/2023 0500 Gross per 24 hour  Intake 1053.33 ml  Output 900 ml  Net 153.33 ml   Filed Weights   09/19/23 1709 09/20/23 1404  Weight: 95 kg 92.4 kg    Examination:  General exam: Somnolent but arousable Respiratory system: Clear to auscultation. Respiratory effort normal. Cardiovascular system: S1 & S2 heard, RRR.  Gastrointestinal system: Abdomen is soft Central nervous system: Somnolent but arousable Extremities: No edema Skin: No significant lesions noted Psychiatry: Flat affect.    Data Reviewed: I have personally reviewed following labs and imaging studies  CBC: Recent Labs  Lab 09/19/23 1848 09/20/23 0258 09/21/23 0507  WBC 8.9 13.4* 6.4  NEUTROABS 7.8*  --   --   HGB 10.2* 10.2* 9.8*  HCT 31.4* 32.1* 30.9*  MCV 111.7* 114.2* 112.4*  PLT 126* 105* 87*   Basic Metabolic Panel: Recent Labs  Lab 09/19/23 1848 09/20/23 0258 09/21/23 0507  NA 140 141 143  K 3.9 4.3 3.6  CL 109 111 115*  CO2 20* 20* 23  GLUCOSE 161* 266* 225*  BUN 14 15 18   CREATININE 0.98 1.13 0.95  CALCIUM 9.2 8.8* 8.8*  MG  --  1.6* 2.1  PHOS  --  3.2  --    GFR: Estimated  Creatinine Clearance: 76.4 mL/min (by C-G formula based on SCr of 0.95 mg/dL). Liver Function Tests: Recent Labs  Lab 09/19/23 1848 09/20/23 0258 09/21/23 0507  AST 64* 65* 164*  ALT 61* 54* 80*  ALKPHOS 104 80 70  BILITOT 1.2 1.6* 0.7  PROT 6.6 6.1* 5.6*  ALBUMIN 3.1* 2.7* 2.5*   No results for input(s): "LIPASE", "AMYLASE" in the last 168 hours. Recent Labs  Lab 09/20/23 0532  AMMONIA 13   Coagulation Profile: Recent Labs  Lab 09/20/23 1321  INR 1.4*   Cardiac Enzymes: No results for input(s): "CKTOTAL", "CKMB", "CKMBINDEX", "TROPONINI" in the last 168 hours. BNP (last 3 results) No results for input(s): "PROBNP" in the last 8760 hours. HbA1C: Recent Labs    09/19/23 1848  HGBA1C 4.4*   CBG: Recent Labs  Lab 09/20/23 1544 09/20/23 2028 09/21/23 0034 09/21/23 0502 09/21/23 0738  GLUCAP 206* 264* 213* 190* 193*   Lipid Profile: No results for input(s): "CHOL", "HDL", "LDLCALC", "TRIG", "CHOLHDL", "LDLDIRECT" in the last 72 hours. Thyroid Function Tests: No results for input(s): "TSH", "T4TOTAL", "FREET4", "T3FREE", "THYROIDAB" in the last 72 hours. Anemia Panel: No results for input(s): "VITAMINB12", "FOLATE", "FERRITIN", "TIBC", "IRON", "RETICCTPCT" in the last 72 hours. Sepsis Labs: Recent Labs  Lab 09/19/23 1848 09/19/23 2045  LATICACIDVEN 2.3* 1.8    Recent Results (from the past 240 hours)  Blood culture (routine x 2)     Status: None (Preliminary result)   Collection Time: 09/19/23  6:48 PM   Specimen: BLOOD LEFT HAND  Result Value Ref Range Status   Specimen Description   Final    BLOOD LEFT HAND Performed at [facility], [address], [place], [place] [address]    Special Requests   Final    BOTTLES DRAWN AEROBIC ONLY Blood Culture results may not be optimal due to an inadequate volume of blood received in culture bottles Performed at [facility], [address] [place], [place] [address]    Culture  Setup Time   Final     GRAM POSITIVE COCCI AEROBIC BOTTLE ONLY Gram Stain Report Called to,Read Back By and Verified With: KRISTY EDWARDS AT 0859 09/20/2023 BY A. SNYDER GRAM STAIN REVIEWED-AGREE WITH RESULT DRT Performed at [facility], [address] [place], [place] [address]    Culture GRAM POSITIVE COCCI  Final   Report Status PENDING  Incomplete  Blood culture (routine x 2)  Status: None (Preliminary result)   Collection Time: 09/19/23  6:48 PM   Specimen: BLOOD LEFT HAND  Result Value Ref Range Status   Specimen Description   Final    BLOOD LEFT HAND Performed at [facility], [address], [place], [place] [address]    Special Requests   Final    BOTTLES DRAWN AEROBIC ONLY Blood Culture results may not be optimal due to an inadequate volume of blood received in culture bottles Performed at [facility], [address] [place], [place] [address]    Culture  Setup Time   Final    GRAM POSITIVE COCCI AEROBIC BOTTLE ONLY Gram Stain Report Called to,Read Back By and Verified With: KRISTY EDWARDS AT 0859 09/20/2023 BY A. SNYDER GRAM STAIN REVIEWED-AGREE WITH RESULT DRT CRITICAL RESULT CALLED TO, READ BACK BY AND VERIFIED WITH: PHARMD F.WILSON [id number] @ 1518 FH Performed at [facility], [address], [place], [place] [address]    Culture GRAM POSITIVE COCCI  Final   Report Status PENDING  Incomplete  Blood Culture ID Panel (Reflexed)     Status: Abnormal   Collection Time: 09/19/23  6:48 PM  Result Value Ref Range Status   Enterococcus faecalis NOT DETECTED NOT DETECTED Final   Enterococcus Faecium NOT DETECTED NOT DETECTED Final   Listeria monocytogenes NOT DETECTED NOT DETECTED Final   Staphylococcus species NOT DETECTED NOT DETECTED Final   Staphylococcus aureus (BCID) NOT DETECTED NOT DETECTED Final   Staphylococcus epidermidis NOT DETECTED NOT DETECTED Final   Staphylococcus lugdunensis NOT DETECTED NOT DETECTED Final   Streptococcus species DETECTED (A) NOT DETECTED  Final    Comment: Not Enterococcus species, Streptococcus agalactiae, Streptococcus pyogenes, or Streptococcus pneumoniae. CRITICAL RESULT CALLED TO, READ BACK BY AND VERIFIED WITH: PHARMD F.WILSON [id number] @ 1518 FH    Streptococcus agalactiae NOT DETECTED NOT DETECTED Final   Streptococcus pneumoniae NOT DETECTED NOT DETECTED Final   Streptococcus pyogenes NOT DETECTED NOT DETECTED Final   A.calcoaceticus-baumannii NOT DETECTED NOT DETECTED Final   Bacteroides fragilis NOT DETECTED NOT DETECTED Final   Enterobacterales NOT DETECTED NOT DETECTED Final   Enterobacter cloacae complex NOT DETECTED NOT DETECTED Final   Escherichia coli NOT DETECTED NOT DETECTED Final   Klebsiella aerogenes NOT DETECTED NOT DETECTED Final   Klebsiella oxytoca NOT DETECTED NOT DETECTED Final   Klebsiella pneumoniae NOT DETECTED NOT DETECTED Final   Proteus species NOT DETECTED NOT DETECTED Final   Salmonella species NOT DETECTED NOT DETECTED Final   Serratia marcescens NOT DETECTED NOT DETECTED Final   Haemophilus influenzae NOT DETECTED NOT DETECTED Final   Neisseria meningitidis NOT DETECTED NOT DETECTED Final   Pseudomonas aeruginosa NOT DETECTED NOT DETECTED Final   Stenotrophomonas maltophilia NOT DETECTED NOT DETECTED Final   Candida albicans NOT DETECTED NOT DETECTED Final   Candida auris NOT DETECTED NOT DETECTED Final   Candida glabrata NOT DETECTED NOT DETECTED Final   Candida krusei NOT DETECTED NOT DETECTED Final   Candida parapsilosis NOT DETECTED NOT DETECTED Final   Candida tropicalis NOT DETECTED NOT DETECTED Final   Cryptococcus neoformans/gattii NOT DETECTED NOT DETECTED Final    Comment: Performed at [facility], [address], [place], [place] [address]  Resp panel by RT-PCR (RSV, Flu A&B, Covid) Anterior Nasal Swab     Status: None   Collection Time: 09/19/23  7:30 PM   Specimen: Anterior Nasal Swab  Result Value Ref Range Status   SARS Coronavirus 2 by RT PCR  NEGATIVE  NEGATIVE Final    Comment: (NOTE) SARS-CoV-2 target nucleic acids are NOT DETECTED.  The SARS-CoV-2 RNA is generally detectable in upper respiratory specimens during the acute phase of infection. The lowest concentration of SARS-CoV-2 viral copies this assay can detect is 138 copies/mL. A negative result does not preclude SARS-Cov-2 infection and should not be used as the sole basis for treatment or other patient management decisions. A negative result may occur with  improper specimen collection/handling, submission of specimen other than nasopharyngeal swab, presence of viral mutation(s) within the areas targeted by this assay, and inadequate number of viral copies(<138 copies/mL). A negative result must be combined with clinical observations, patient history, and epidemiological information. The expected result is Negative.  Fact Sheet for Patients:  BloggerCourse.com  Fact Sheet for Healthcare Providers:  SeriousBroker.it  This test is no t yet approved or cleared by the [place] FDA and  has been authorized for detection and/or diagnosis of SARS-CoV-2 by FDA under an Emergency Use Authorization (EUA). This EUA will remain  in effect (meaning this test can be used) for the duration of the COVID-19 declaration under Section 564(b)(1) of the Act, 21 U.S.C.section 360bbb-3(b)(1), unless the authorization is terminated  or revoked sooner.       Influenza A by PCR NEGATIVE NEGATIVE Final   Influenza B by PCR NEGATIVE NEGATIVE Final    Comment: (NOTE) The Xpert Xpress SARS-CoV-2/FLU/RSV plus assay is intended as an aid in the diagnosis of influenza from Nasopharyngeal swab specimens and should not be used as a sole basis for treatment. Nasal washings and aspirates are unacceptable for Xpert Xpress SARS-CoV-2/FLU/RSV testing.  Fact Sheet for Patients: BloggerCourse.com  Fact Sheet for Healthcare  Providers: SeriousBroker.it  This test is not yet approved or cleared by the [place] FDA and has been authorized for detection and/or diagnosis of SARS-CoV-2 by FDA under an Emergency Use Authorization (EUA). This EUA will remain in effect (meaning this test can be used) for the duration of the COVID-19 declaration under Section 564(b)(1) of the Act, 21 U.S.C. section 360bbb-3(b)(1), unless the authorization is terminated or revoked.     Resp Syncytial Virus by PCR NEGATIVE NEGATIVE Final    Comment: (NOTE) Fact Sheet for Patients: BloggerCourse.com  Fact Sheet for Healthcare Providers: SeriousBroker.it  This test is not yet approved or cleared by the [place] FDA and has been authorized for detection and/or diagnosis of SARS-CoV-2 by FDA under an Emergency Use Authorization (EUA). This EUA will remain in effect (meaning this test can be used) for the duration of the COVID-19 declaration under Section 564(b)(1) of the Act, 21 U.S.C. section 360bbb-3(b)(1), unless the authorization is terminated or revoked.  Performed at [facility], [address], [place], [place] [address]   Respiratory (~20 pathogens) panel by PCR     Status: None   Collection Time: 09/20/23  7:20 AM   Specimen: Nasopharyngeal Swab; Respiratory  Result Value Ref Range Status   Adenovirus NOT DETECTED NOT DETECTED Final   Coronavirus 229E NOT DETECTED NOT DETECTED Final    Comment: (NOTE) The Coronavirus on the Respiratory Panel, DOES NOT test for the novel  Coronavirus (2019 nCoV)    Coronavirus HKU1 NOT DETECTED NOT DETECTED Final   Coronavirus NL63 NOT DETECTED NOT DETECTED Final   Coronavirus OC43 NOT DETECTED NOT DETECTED Final   Metapneumovirus NOT DETECTED NOT DETECTED Final   Rhinovirus / Enterovirus NOT DETECTED NOT DETECTED Final   Influenza A NOT DETECTED NOT DETECTED Final  Influenza B NOT DETECTED NOT  DETECTED Final   Parainfluenza Virus 1 NOT DETECTED NOT DETECTED Final   Parainfluenza Virus 2 NOT DETECTED NOT DETECTED Final   Parainfluenza Virus 3 NOT DETECTED NOT DETECTED Final   Parainfluenza Virus 4 NOT DETECTED NOT DETECTED Final   Respiratory Syncytial Virus NOT DETECTED NOT DETECTED Final   Bordetella pertussis NOT DETECTED NOT DETECTED Final   Bordetella Parapertussis NOT DETECTED NOT DETECTED Final   Chlamydophila pneumoniae NOT DETECTED NOT DETECTED Final   Mycoplasma pneumoniae NOT DETECTED NOT DETECTED Final    Comment: Performed at [facility], [address], [place], [place] [address]  MRSA Next Gen by PCR, Nasal     Status: None   Collection Time: 09/20/23 12:35 PM   Specimen: Nasal Mucosa; Nasal Swab  Result Value Ref Range Status   MRSA by PCR Next Gen NOT DETECTED NOT DETECTED Final    Comment: (NOTE) The GeneXpert MRSA Assay (FDA approved for NASAL specimens only), is one component of a comprehensive MRSA colonization surveillance program. It is not intended to diagnose MRSA infection nor to guide or monitor treatment for MRSA infections. Test performance is not FDA approved in patients less than 46 years old. Performed at [facility], [address] [place], [place] [address]   Culture, blood (Routine X 2) w Reflex to ID Panel     Status: None (Preliminary result)   Collection Time: 09/20/23  4:55 PM   Specimen: BLOOD  Result Value Ref Range Status   Specimen Description BLOOD BLOOD LEFT ARM  Final   Special Requests   Final    BOTTLES DRAWN AEROBIC AND ANAEROBIC Blood Culture adequate volume   Culture   Final    NO GROWTH < 24 HOURS Performed at [facility], [address] [place], [place] [address]    Report Status PENDING  Incomplete  Culture, blood (Routine X 2) w Reflex to ID Panel     Status: None (Preliminary result)   Collection Time: 09/20/23  4:55 PM   Specimen: BLOOD  Result Value Ref Range Status   Specimen Description BLOOD BLOOD  RIGHT HAND  Final   Special Requests   Final    BOTTLES DRAWN AEROBIC AND ANAEROBIC Blood Culture adequate volume   Culture   Final    NO GROWTH < 24 HOURS Performed at [facility], [address] [place], [place] [address]    Report Status PENDING  Incomplete         Radiology Studies: MR BRAIN WO CONTRAST Result Date: 09/20/2023 CLINICAL DATA:  Encephalopathy. Right-sided facial, arm, and leg weakness. Possible stroke. EXAM: MRI HEAD WITHOUT CONTRAST TECHNIQUE: Multiplanar, multiecho pulse sequences of the brain and surrounding structures were obtained without intravenous contrast. COMPARISON:  Head CT 09/20/2023 FINDINGS: Despite multiple attempts, the examination had to be discontinued prior to completion as the patient attempted to remove the head coil. Axial and coronal diffusion weighted imaging and a sagittal T1 sequence were obtained and suffer from variable, up to severe motion artifact. Brain: A repeated axial DWI sequence is of reasonable quality with only mild motion artifact, and there is no evidence of an acute infarct. No significant intracranial mass effect or sizable extra-axial collection is evident on this limited study. Patchy T2 hyperintensities in the cerebral white matter are incompletely evaluated and nonspecific but most commonly seen with chronic small vessel ischemia. A chronic lacunar infarct or dilated perivascular space is noted in the posterior right frontal white matter. Vascular:  Not assessed on this limited study. Skull and upper cervical spine: No destructive skull lesion. Sinuses/Orbits: Grossly clear paranasal sinuses. Limited assessment of the orbits. Other: None. IMPRESSION: 1. Incomplete, motion degraded examination. 2. No acute infarct. Electronically Signed   By: Sebastian Ache M.D.   On: 09/20/2023 15:19   ECHOCARDIOGRAM COMPLETE BUBBLE STUDY Result Date: 09/20/2023    ECHOCARDIOGRAM REPORT   Patient Name:   Tony Cantu Offield Date of Exam: 09/20/2023 Medical  Rec #:  [id number]      Height:       68.0 in Accession #:    [id number]     Weight:       209.4 lb Date of Birth:  1951/08/20       BSA:          2.084 m Patient Age:    72 years       BP:           99/57 mmHg Patient Gender: M              HR:           66 bpm. [facility] Procedure: 2D Echo, Cardiac Doppler and Color Doppler STAT ECHO Indications:    Sepsis  History:        Patient has prior history of Echocardiogram examinations, most                 recent 09/22/2021. COPD, Arrythmias:Atrial Fibrillation and                 Tachycardia; Risk Factors:Hypertension, Diabetes, Dyslipidemia                 and Former Smoker.  Sonographer:    Vern Claude Referring Phys: [id number] JONATHAN SEGARS IMPRESSIONS  1. The aortic valve is calcified. Possibly tricuspid. There is severe calcifcation of the aortic valve. There is severe thickening of the aortic valve. Aortic valve regurgitation is trivial. Severe aortic valve stenosis. Aortic valve area, by VTI measures 0.89 cm. Aortic valve mean gradient measures 46.3 mmHg. Aortic valve Vmax measures 4.55 m/s. DVI is 0.27.  2. Left ventricular ejection fraction, by estimation, is 55 to 60%. The left ventricle has normal function. Left ventricular endocardial border not optimally defined to evaluate regional Hinesley motion. The left ventricular internal cavity size was mildly to moderately dilated. Left ventricular diastolic parameters are consistent with Grade I diastolic dysfunction (impaired relaxation).  3. Right ventricular systolic function is normal. The right ventricular size is normal. Tricuspid regurgitation signal is inadequate for assessing PA pressure.  4. The mitral valve is abnormal. Trivial mitral valve regurgitation. Mild mitral stenosis. The mean mitral valve gradient is 3.0 mmHg. Moderate mitral annular calcification.  5. The inferior vena cava is normal in size with greater than 50% respiratory variability, suggesting right atrial pressure of 3  mmHg. Comparison(s): Changes from prior study are noted. Aortic valve stensois progressed from moderate (in 2023) to severe now. V max is 4.55 m/s and mean PG 46.3 mm Hg. FINDINGS  Left Ventricle: Left ventricular ejection fraction, by estimation, is 55 to 60%. The left ventricle has normal function. Left ventricular endocardial border not optimally defined to evaluate regional Grammatico motion. The left ventricular internal cavity size was mildly to moderately dilated. There is no left ventricular hypertrophy. Left ventricular diastolic parameters are consistent with Grade I diastolic dysfunction (impaired relaxation). Normal left ventricular filling pressure. Right Ventricle: The right ventricular size is normal. No increase in right  ventricular Chartrand thickness. Right ventricular systolic function is normal. Tricuspid regurgitation signal is inadequate for assessing PA pressure. Left Atrium: Left atrial size was normal in size. Right Atrium: Right atrial size was normal in size. Pericardium: There is no evidence of pericardial effusion. Mitral Valve: The mitral valve is abnormal. Moderate mitral annular calcification. Trivial mitral valve regurgitation. Mild mitral valve stenosis. MV peak gradient, 5.8 mmHg. The mean mitral valve gradient is 3.0 mmHg. Tricuspid Valve: The tricuspid valve is grossly normal. Tricuspid valve regurgitation is not demonstrated. No evidence of tricuspid stenosis. Aortic Valve: The aortic valve is calcified. There is severe calcifcation of the aortic valve. There is severe thickening of the aortic valve. Aortic valve regurgitation is trivial. Severe aortic stenosis is present. Aortic valve mean gradient measures 46.3 mmHg. Aortic valve peak gradient measures 82.9 mmHg. Aortic valve area, by VTI measures 0.89 cm. Pulmonic Valve: The pulmonic valve was not well visualized. Pulmonic valve regurgitation is trivial. No evidence of pulmonic stenosis. Aorta: The aortic root and ascending aorta are  structurally normal, with no evidence of dilitation. Venous: The inferior vena cava is normal in size with greater than 50% respiratory variability, suggesting right atrial pressure of 3 mmHg. IAS/Shunts: The interatrial septum was not well visualized.  LEFT VENTRICLE PLAX 2D LVIDd:         6.00 cm      Diastology LVIDs:         4.20 cm      LV e' medial:    5.11 cm/s LV PW:         0.80 cm      LV E/e' medial:  19.3 LV IVS:        0.90 cm      LV e' lateral:   8.38 cm/s LVOT diam:     2.00 cm      LV E/e' lateral: 11.8 LV SV:         80 LV SV Index:   38 LVOT Area:     3.14 cm  LV Volumes (MOD) LV vol d, MOD A2C: 186.0 ml LV vol d, MOD A4C: 182.0 ml LV vol s, MOD A2C: 72.2 ml LV vol s, MOD A4C: 63.4 ml LV SV MOD A2C:     113.8 ml LV SV MOD A4C:     182.0 ml LV SV MOD BP:      119.1 ml RIGHT VENTRICLE             IVC RV Basal diam:  3.90 cm     IVC diam: 1.00 cm RV Mid diam:    2.50 cm RV S prime:     15.20 cm/s TAPSE (M-mode): 3.1 cm LEFT ATRIUM             Index        RIGHT ATRIUM           Index LA diam:        3.70 cm 1.78 cm/m   RA Area:     14.40 cm LA Vol (A2C):   91.6 ml 43.95 ml/m  RA Volume:   35.40 ml  16.99 ml/m LA Vol (A4C):   44.6 ml 21.40 ml/m LA Biplane Vol: 69.0 ml 33.11 ml/m  AORTIC VALVE                     PULMONIC VALVE AV Area (Vmax):    0.87 cm      PV Vmax:  0.98 m/s AV Area (Vmean):   0.86 cm      PV Peak grad:  3.8 mmHg AV Area (VTI):     0.89 cm AV Vmax:           455.33 cm/s AV Vmean:          310.000 cm/s AV VTI:            0.896 m AV Peak Grad:      82.9 mmHg AV Mean Grad:      46.3 mmHg LVOT Vmax:         126.00 cm/s LVOT Vmean:        85.000 cm/s LVOT VTI:          0.255 m LVOT/AV VTI ratio: 0.28  AORTA Ao Root diam: 3.20 cm Ao Asc diam:  3.20 cm MITRAL VALVE MV Area (PHT): 3.26 cm     SHUNTS MV Area VTI:   2.27 cm     Systemic VTI:  0.26 m MV Peak grad:  5.8 mmHg     Systemic Diam: 2.00 cm MV Mean grad:  3.0 mmHg MV Vmax:       1.20 m/s MV Vmean:      82.0 cm/s MV  Decel Time: 233 msec MV E velocity: 98.80 cm/s MV A velocity: 118.00 cm/s MV E/A ratio:  0.84 Vishnu Priya Mallipeddi Electronically signed by Winfield Rast Mallipeddi Signature Date/Time: 09/20/2023/9:56:59 AM    Final    CT HEAD WO CONTRAST ( ) Result Date: 09/20/2023 CLINICAL DATA:  Encephalopathy, right-sided weakness EXAM: CT HEAD WITHOUT CONTRAST TECHNIQUE: Contiguous axial images were obtained from the base of the skull through the vertex without intravenous contrast. RADIATION DOSE REDUCTION: This exam was performed according to the departmental dose-optimization program which includes automated exposure control, adjustment of the mA and/or kV according to patient size and/or use of iterative reconstruction technique. COMPARISON:  None Available. FINDINGS: Brain: Normal anatomic configuration of the brain. Mild periventricular white matter changes are present likely the sequela small vessel ischemia. Tiny remote lacunar infarct noted within the right frontal subcortical white matter. No acute intracranial hemorrhage or infarct. No mass effect or midline shift no abnormal intra or extra-axial mass lesion. Ventricular size is normal. Cerebellum is unremarkable. Vascular: No hyperdense vessel or unexpected calcification. Skull: Normal. Negative for fracture or focal lesion. Sinuses/Orbits: No acute finding. Other: Mastoid air cells and middle ear cavities are clear. IMPRESSION: 1. No acute intracranial abnormality. 2. Mild senescent change. 3. Tiny remote lacunar infarct within the right frontal subcortical white matter. Electronically Signed   By: Helyn Numbers M.D.   On: 09/20/2023 01:45   CT CHEST ABDOMEN PELVIS W CONTRAST Result Date: 09/19/2023 CLINICAL DATA:  Sepsis fever EXAM: CT CHEST, ABDOMEN, AND PELVIS WITH CONTRAST TECHNIQUE: Multidetector CT imaging of the chest, abdomen and pelvis was performed following the standard protocol during bolus administration of intravenous contrast. RADIATION  DOSE REDUCTION: This exam was performed according to the departmental dose-optimization program which includes automated exposure control, adjustment of the mA and/or kV according to patient size and/or use of iterative reconstruction technique. CONTRAST:  OMNIPAQUE IOHEXOL 300 MG/ML  SOLN COMPARISON:  Chest x-ray 09/19/2023, CT 04/28/2021, 02/20/2019 FINDINGS: CT CHEST FINDINGS Cardiovascular: Mild aortic atherosclerosis. No aneurysm. Mitral annular calcification. Aortic valvular calcification. Borderline cardiomegaly. No pericardial effusion Mediastinum/Nodes: Patent trachea. No thyroid mass. No suspicious lymph nodes. Esophagus within normal limits Lungs/Pleura: No acute airspace disease, pleural effusion or pneumothorax. Musculoskeletal: Sternum appears intact. There are degenerative  changes of the spine. No acute osseous abnormality CT ABDOMEN PELVIS FINDINGS Hepatobiliary: Liver cirrhosis. No calcified gallstone or biliary dilatation Pancreas: Poorly visible, presumably due to marked atrophy. No inflammation. Spleen: Normal in size without focal abnormality. Adrenals/Urinary Tract: Adrenal glands are normal. Kidneys show no hydronephrosis. Renal cysts for which no imaging follow-up is recommended. Mild left greater than right perinephric stranding. Possible areas of cortical hypoenhancement involving the kidneys on delayed views but limited by artifact from the patient's arms. There is also motion degradation. The bladder is unremarkable Stomach/Bowel: The stomach is nonenlarged. There is no dilated small bowel. No acute bowel [place] thickening. Diverticular disease of the colon. Vascular/Lymphatic: Mild aortic atherosclerosis. No aneurysm. No suspicious lymph nodes. Gastroesophageal varices. Collateral vessels in the left upper quadrant. Reproductive: Prostate is unremarkable. Other: Negative for pelvic effusion or free air. Mild skin thickening of the right anterior abdominal [place] Musculoskeletal:  Degenerative changes. No acute or suspicious osseous abnormality. IMPRESSION: 1. No CT evidence for acute intrathoracic abnormality. 2. Liver cirrhosis with evidence of portal hypertension including gastroesophageal varices. 3. Diverticular disease of the colon without acute inflammatory process. 4. Mild left greater than right perinephric stranding with possible areas of cortical hypoenhancement involving the kidneys on delayed views but limited by artifact from the patient's arms and motion degradation. Correlate with urinalysis to assess for UTI/pyelonephritis Aortic Atherosclerosis (ICD10-I70.0). Electronically Signed   By: Jasmine Pang M.D.   On: 09/19/2023 21:24   DG Chest Portable 1 View Result Date: 09/19/2023 CLINICAL DATA:  Generalized weakness and fever today. EXAM: PORTABLE CHEST 1 VIEW COMPARISON:  03/10/2022 FINDINGS: The cardiac silhouette remains mildly enlarged. Decreased prominence of the pulmonary vasculature. Mildly prominent interstitial markings with an appearance compatible with chronic interstitial lung disease without superimposed pulmonary edema. No airspace consolidation or pleural fluid. Left shoulder prosthesis. Lower thoracic and upper lumbar spine degenerative changes. IMPRESSION: 1. No acute abnormality. 2. Mild cardiomegaly and mild chronic interstitial lung disease. Electronically Signed   By: Beckie Salts M.D.   On: 09/19/2023 18:24        Scheduled Meds:  Chlorhexidine Gluconate Cloth  6 each Topical Daily   insulin aspart  0-9 Units Subcutaneous Q4H   insulin glargine-yfgn  10 Units Subcutaneous Daily   pantoprazole (PROTONIX) IV  40 mg Intravenous Q12H   sodium chloride flush  3 mL Intravenous Q12H   Continuous Infusions:  sodium chloride     cefTRIAXone (ROCEPHIN)  IV 2 g (09/20/23 2122)   norepinephrine (LEVOPHED) Adult infusion       LOS: 1 day    Time spent: 55 minutes    Darnette Lampron Hoover Brunette, DO [facility]  If 7PM-7AM, please contact  night-coverage [email or web address] 09/21/2023, 10:13 AM

## 2023-09-21 NOTE — Evaluation (Signed)
Physical Therapy Evaluation Patient Details Name: Tony Cantu MRN: [id number] DOB: [birth year] Today's Date: 09/21/2023  History of Present Illness  Tony Cantu is a 73 y.o. male with hx of type 1 diabetes, A-fib on Eliquis, cirrhosis, seizure disorder, aortic stenosis, hypertension, peptic ulcer disease, OA with bilateral knee replacement, polyclonal gammopathy, OSA on CPAP, who was brought in from home due to progressive encephalopathy.  History is obtained by patient's wife due to patient's altered mentation and somnolence.  At time of interview he is able to wake up and state his name but that is the extent he can do right now.  Per wife who is an Charity fundraiser, was Normal going to bed on Monday (LKWT 1/20 evening). Awoke on Tues 1/21 at 4AM; called out to wife for help getting up to the bathroom which is very unusual. At baseline fully independent with normal mental status, no DME. She checked blood glucose, was high in 200s. Checked temp and had fever to 101 F. More somnolent throughout the day. Wife had been at work, was calling to check / visiting during day. Around 3PM did not answer phone, she came to check on him and he was disoriented to time. Did not seem to have any focal weakness. No prior illness, chills, rigors, cough / cold symptoms, GI symptoms, urinary changes. Had headache on Friday. No hx of known prior stroke or chronic weakness.   Clinical Impression  Patient was agreeable to therapy. With [facility] elevated and mod/max assist from PT patient was able to sit EOB. While at EOB patient required verbal/tactile cueing to lean forward to prevent from falling backward. Patient was able to maintain balance following cueing. Mod assist was provided for initial boost for sit to stand with UE support from RW. RW was used to transfer from bed to chair with mod/max assist. Patient was able to take a steps to transfer from bed to chair. At conclusion of session patient was left in chair with call bell near.  Patient will benefit from continued skilled physical therapy in hospital and recommended venue below to increase strength, balance, endurance for safe ADLs and gait.        If plan is discharge home, recommend the following: A lot of help with bathing/dressing/bathroom;A lot of help with walking and/or transfers;Assistance with cooking/housework;Help with stairs or ramp for entrance   Can travel by private vehicle        Equipment Recommendations None recommended by PT  Recommendations for Other Services       Functional Status Assessment Patient has had a recent decline in their functional status and demonstrates the ability to make significant improvements in function in a reasonable and predictable amount of time.     Precautions / Restrictions Precautions Precautions: Fall Restrictions Weight Bearing Restrictions Per Provider Order: No      Mobility  Bed Mobility Overal bed mobility: Needs Assistance Bed Mobility: Supine to Sit     Supine to sit: Mod assist, Max assist       Patient Response: Cooperative  Transfers Overall transfer level: Needs assistance Equipment used: Rolling walker (2 wheels) Transfers: Sit to/from Stand, Bed to chair/wheelchair/BSC Sit to Stand: Mod assist   Step pivot transfers: Mod assist, Max assist            Ambulation/Gait Ambulation/Gait assistance: Mod assist, Max assist Gait Distance (Feet): 3 Feet (side steps) Assistive device: Rolling walker (2 wheels) Gait Pattern/deviations: Step-to pattern, Decreased step length - right, Decreased step length -  left, Decreased stride length Gait velocity: slow and unsteady     General Gait Details: heavy reliance on RW  Stairs            Wheelchair Mobility     Tilt Bed Tilt Bed Patient Response: Cooperative  Modified Rankin (Stroke Patients Only)       Balance Overall balance assessment: Needs assistance Sitting-balance support: Bilateral upper extremity  supported, Feet supported Sitting balance-Leahy Scale: Poor Sitting balance - Comments: poor/fair, kept trying to lay back down Postural control: Posterior lean Standing balance support: Bilateral upper extremity supported, Reliant on assistive device for balance Standing balance-Leahy Scale: Poor Standing balance comment: poor/fair                             Pertinent Vitals/Pain Pain Assessment Pain Assessment: No/denies pain    Home Living Family/patient expects to be discharged to:: Private residence Living Arrangements: Spouse/significant other Available Help at Discharge: Family;Available 24 hours/day Type of Home: House Home Access: Stairs to enter Entrance Stairs-Rails: None Entrance Stairs-Number of Steps: 1+1   Home Layout: Able to live on main level with bedroom/bathroom Home Equipment: Agricultural consultant (2 wheels);Cane - single point;Standard Walker;BSC/3in1      Prior Function Prior Level of Function : Needs assist       Physical Assist : Mobility (physical) Mobility (physical): Bed mobility   Mobility Comments: wife was helping ADLs Comments: wife does a lot of it     Extremity/Trunk Assessment   Upper Extremity Assessment Upper Extremity Assessment: Generalized weakness    Lower Extremity Assessment Lower Extremity Assessment: Generalized weakness       Communication   Communication Communication: No apparent difficulties Cueing Techniques: Verbal cues;Tactile cues  Cognition Arousal: Alert Behavior During Therapy: WFL for tasks assessed/performed Overall Cognitive Status: Within Functional Limits for tasks assessed                                          General Comments      Exercises     Assessment/Plan    PT Assessment Patient needs continued PT services  PT Problem List Decreased strength;Decreased range of motion;Decreased activity tolerance;Decreased balance;Decreased mobility;Decreased  coordination       PT Treatment Interventions DME instruction;Gait training;Patient/family education;Stair training;Functional mobility training;Therapeutic activities;Therapeutic exercise;Balance training    PT Goals (Current goals can be found in the Care Plan section)  Acute Rehab PT Goals Patient Stated Goal: To return home PT Goal Formulation: With patient Time For Goal Achievement: 10/05/23 Potential to Achieve Goals: Good    Frequency Min 3X/week     Co-evaluation               AM-PAC PT "6 Clicks" Mobility  Outcome Measure Help needed turning from your back to your side while in a flat bed without using bedrails?: A Lot Help needed moving from lying on your back to sitting on the side of a flat bed without using bedrails?: A Lot Help needed moving to and from a bed to a chair (including a wheelchair)?: A Lot Help needed standing up from a chair using your arms (e.g., wheelchair or bedside chair)?: A Lot Help needed to walk in hospital room?: A Lot Help needed climbing 3-5 steps with a railing? : Total 6 Click Score: 11    End of Session Equipment Utilized During  Treatment: Gait belt Activity Tolerance: Patient tolerated treatment well;Patient limited by fatigue Patient left: in chair;with call bell/phone within reach Nurse Communication: Mobility status PT Visit Diagnosis: Unsteadiness on feet (R26.81);Other abnormalities of gait and mobility (R26.89);Muscle weakness (generalized) (M62.81)    Time: 1610-9604 PT Time Calculation (min) (ACUTE ONLY): 30 min   Charges:   PT Evaluation $PT Eval Moderate Complexity: 1 Mod PT Treatments $Therapeutic Activity: 23-37 mins PT General Charges $$ ACUTE PT VISIT: 1 Visit         Miata Culbreth SPT

## 2023-09-21 NOTE — Progress Notes (Addendum)
Gastroenterology Progress Note   Referring Provider: No ref. provider found Primary Care Physician:  [name], DO Primary Gastroenterologist:  Dr. Erick Blinks  Patient ID: Tony Cantu; [id number]; [birth year]   Subjective:    Feels like breathing is better than when admitted. Denies abdominal pain. No further documented stools since episode of melena in the ER.  Objective:   Vital signs in last 24 hours: Temp:  [97.5 F (36.4 C)-103 F (39.4 C)] 99.5 F (37.5 C) (01/23 0757) Pulse Rate:  [61-104] 75 (01/23 0900) Resp:  [15-25] 16 (01/23 0900) BP: (75-132)/(47-87) 128/60 (01/23 0900) SpO2:  [93 %-100 %] 98 % (01/23 0900) Weight:  [92.4 kg] 92.4 kg (01/22 1404)   General:   Alert,  Well-developed, well-nourished, pleasant and cooperative in NAD Head:  Normocephalic and atraumatic. Eyes:  Sclera clear, no icterus.   Abdomen:  Soft, nontender and nondistended.  Normal bowel sounds, without guarding, and without rebound.   Extremities:  Without clubbing, deformity or edema. Neurologic:  Alert and  oriented x4;  grossly normal neurologically. Skin:  Intact without significant lesions or rashes. Psych:  Alert and cooperative. Normal mood and affect.  Intake/Output from previous day: 01/22 0701 - 01/23 0700 In: 1053.3 [I.V.:1053.3] Out: 900 [Urine:900] Intake/Output this shift: No intake/output data recorded.  Lab Results: CBC Recent Labs    09/19/23 1848 09/20/23 0258 09/21/23 0507  WBC 8.9 13.4* 6.4  HGB 10.2* 10.2* 9.8*  HCT 31.4* 32.1* 30.9*  MCV 111.7* 114.2* 112.4*  PLT 126* 105* 87*   BMET Recent Labs    09/19/23 1848 09/20/23 0258 09/21/23 0507  NA 140 141 143  K 3.9 4.3 3.6  CL 109 111 115*  CO2 20* 20* 23  GLUCOSE 161* 266* 225*  BUN 14 15 18   CREATININE 0.98 1.13 0.95  CALCIUM 9.2 8.8* 8.8*   LFTs Recent Labs    09/19/23 1848 09/20/23 0258 09/21/23 0507  BILITOT 1.2 1.6* 0.7  BILIDIR  --  0.5*  --   IBILI  --  1.1*  --    ALKPHOS 104 80 70  AST 64* 65* 164*  ALT 61* 54* 80*  PROT 6.6 6.1* 5.6*  ALBUMIN 3.1* 2.7* 2.5*   No results for input(s): "LIPASE" in the last 72 hours. PT/INR Recent Labs    09/20/23 1321  LABPROT 17.7*  INR 1.4*         Imaging Studies: MR BRAIN WO CONTRAST Result Date: 09/20/2023 CLINICAL DATA:  Encephalopathy. Right-sided facial, arm, and leg weakness. Possible stroke. EXAM: MRI HEAD WITHOUT CONTRAST TECHNIQUE: Multiplanar, multiecho pulse sequences of the brain and surrounding structures were obtained without intravenous contrast. COMPARISON:  Head CT 09/20/2023 FINDINGS: Despite multiple attempts, the examination had to be discontinued prior to completion as the patient attempted to remove the head coil. Axial and coronal diffusion weighted imaging and a sagittal T1 sequence were obtained and suffer from variable, up to severe motion artifact. Brain: A repeated axial DWI sequence is of reasonable quality with only mild motion artifact, and there is no evidence of an acute infarct. No significant intracranial mass effect or sizable extra-axial collection is evident on this limited study. Patchy T2 hyperintensities in the cerebral white matter are incompletely evaluated and nonspecific but most commonly seen with chronic small vessel ischemia. A chronic lacunar infarct or dilated perivascular space is noted in the posterior right frontal white matter. Vascular: Not assessed on this limited study. Skull and upper cervical spine: No destructive skull lesion.  Sinuses/Orbits: Grossly clear paranasal sinuses. Limited assessment of the orbits. Other: None. IMPRESSION: 1. Incomplete, motion degraded examination. 2. No acute infarct. Electronically Signed   By: Sebastian Ache M.D.   On: 09/20/2023 15:19   ECHOCARDIOGRAM COMPLETE BUBBLE STUDY Result Date: 09/20/2023    ECHOCARDIOGRAM REPORT   Patient Name:   Tony Cantu Date of Exam: 09/20/2023 Medical Rec #:  [id number]      Height:       68.0  in Accession #:    [id number]     Weight:       209.4 lb Date of Birth:  [birth year]       BSA:          2.084 m Patient Age:    73 years       BP:           99/57 mmHg Patient Gender: M              HR:           66 bpm. Ex[facility] Procedure: 2D Echo, Cardiac Doppler and Color Doppler STAT ECHO Indications:    Sepsis  History:        Patient has prior history of Echocardiogram examinations, most                 recent 09/22/2021. COPD, Arrythmias:Atrial Fibrillation and                 Tachycardia; Risk Factors:Hypertension, Diabetes, Dyslipidemia                 and Former Smoker.  Sonographer:    Vern Claude Referring Phys: [id number] JONATHAN SEGARS IMPRESSIONS  1. The aortic valve is calcified. Possibly tricuspid. There is severe calcifcation of the aortic valve. There is severe thickening of the aortic valve. Aortic valve regurgitation is trivial. Severe aortic valve stenosis. Aortic valve area, by VTI measures 0.89 cm. Aortic valve mean gradient measures 46.3 mmHg. Aortic valve Vmax measures 4.55 m/s. DVI is 0.27.  2. Left ventricular ejection fraction, by estimation, is 55 to 60%. The left ventricle has normal function. Left ventricular endocardial border not optimally defined to evaluate regional Steege motion. The left ventricular internal cavity size was mildly to moderately dilated. Left ventricular diastolic parameters are consistent with Grade I diastolic dysfunction (impaired relaxation).  3. Right ventricular systolic function is normal. The right ventricular size is normal. Tricuspid regurgitation signal is inadequate for assessing PA pressure.  4. The mitral valve is abnormal. Trivial mitral valve regurgitation. Mild mitral stenosis. The mean mitral valve gradient is 3.0 mmHg. Moderate mitral annular calcification.  5. The inferior vena cava is normal in size with greater than 50% respiratory variability, suggesting right atrial pressure of 3 mmHg. Comparison(s): Changes from prior study  are noted. Aortic valve stensois progressed from moderate (in 2023) to severe now. V max is 4.55 m/s and mean PG 46.3 mm Hg. FINDINGS  Left Ventricle: Left ventricular ejection fraction, by estimation, is 55 to 60%. The left ventricle has normal function. Left ventricular endocardial border not optimally defined to evaluate regional Blackson motion. The left ventricular internal cavity size was mildly to moderately dilated. There is no left ventricular hypertrophy. Left ventricular diastolic parameters are consistent with Grade I diastolic dysfunction (impaired relaxation). Normal left ventricular filling pressure. Right Ventricle: The right ventricular size is normal. No increase in right ventricular Hodzic thickness. Right ventricular systolic function is normal. Tricuspid regurgitation signal is inadequate for  assessing PA pressure. Left Atrium: Left atrial size was normal in size. Right Atrium: Right atrial size was normal in size. Pericardium: There is no evidence of pericardial effusion. Mitral Valve: The mitral valve is abnormal. Moderate mitral annular calcification. Trivial mitral valve regurgitation. Mild mitral valve stenosis. MV peak gradient, 5.8 mmHg. The mean mitral valve gradient is 3.0 mmHg. Tricuspid Valve: The tricuspid valve is grossly normal. Tricuspid valve regurgitation is not demonstrated. No evidence of tricuspid stenosis. Aortic Valve: The aortic valve is calcified. There is severe calcifcation of the aortic valve. There is severe thickening of the aortic valve. Aortic valve regurgitation is trivial. Severe aortic stenosis is present. Aortic valve mean gradient measures 46.3 mmHg. Aortic valve peak gradient measures 82.9 mmHg. Aortic valve area, by VTI measures 0.89 cm. Pulmonic Valve: The pulmonic valve was not well visualized. Pulmonic valve regurgitation is trivial. No evidence of pulmonic stenosis. Aorta: The aortic root and ascending aorta are structurally normal, with no evidence of  dilitation. Venous: The inferior vena cava is normal in size with greater than 50% respiratory variability, suggesting right atrial pressure of 3 mmHg. IAS/Shunts: The interatrial septum was not well visualized.  LEFT VENTRICLE PLAX 2D LVIDd:         6.00 cm      Diastology LVIDs:         4.20 cm      LV e' medial:    5.11 cm/s LV PW:         0.80 cm      LV E/e' medial:  19.3 LV IVS:        0.90 cm      LV e' lateral:   8.38 cm/s LVOT diam:     2.00 cm      LV E/e' lateral: 11.8 LV SV:         80 LV SV Index:   38 LVOT Area:     3.14 cm  LV Volumes (MOD) LV vol d, MOD A2C: 186.0 ml LV vol d, MOD A4C: 182.0 ml LV vol s, MOD A2C: 72.2 ml LV vol s, MOD A4C: 63.4 ml LV SV MOD A2C:     113.8 ml LV SV MOD A4C:     182.0 ml LV SV MOD BP:      119.1 ml RIGHT VENTRICLE             IVC RV Basal diam:  3.90 cm     IVC diam: 1.00 cm RV Mid diam:    2.50 cm RV S prime:     15.20 cm/s TAPSE (M-mode): 3.1 cm LEFT ATRIUM             Index        RIGHT ATRIUM           Index LA diam:        3.70 cm 1.78 cm/m   RA Area:     14.40 cm LA Vol (A2C):   91.6 ml 43.95 ml/m  RA Volume:   35.40 ml  16.99 ml/m LA Vol (A4C):   44.6 ml 21.40 ml/m LA Biplane Vol: 69.0 ml 33.11 ml/m  AORTIC VALVE                     PULMONIC VALVE AV Area (Vmax):    0.87 cm      PV Vmax:       0.98 m/s AV Area (Vmean):   0.86 cm      PV  Peak grad:  3.8 mmHg AV Area (VTI):     0.89 cm AV Vmax:           455.33 cm/s AV Vmean:          310.000 cm/s AV VTI:            0.896 m AV Peak Grad:      82.9 mmHg AV Mean Grad:      46.3 mmHg LVOT Vmax:         126.00 cm/s LVOT Vmean:        85.000 cm/s LVOT VTI:          0.255 m LVOT/AV VTI ratio: 0.28  AORTA Ao Root diam: 3.20 cm Ao Asc diam:  3.20 cm MITRAL VALVE MV Area (PHT): 3.26 cm     SHUNTS MV Area VTI:   2.27 cm     Systemic VTI:  0.26 m MV Peak grad:  5.8 mmHg     Systemic Diam: 2.00 cm MV Mean grad:  3.0 mmHg MV Vmax:       1.20 m/s MV Vmean:      82.0 cm/s MV Decel Time: 233 msec MV E velocity: 98.80  cm/s MV A velocity: 118.00 cm/s MV E/A ratio:  0.84 Vishnu Priya Mallipeddi Electronically signed by Winfield Rast Mallipeddi Signature Date/Time: 09/20/2023/9:56:59 AM    Final    CT HEAD WO CONTRAST ( ) Result Date: 09/20/2023 CLINICAL DATA:  Encephalopathy, right-sided weakness EXAM: CT HEAD WITHOUT CONTRAST TECHNIQUE: Contiguous axial images were obtained from the base of the skull through the vertex without intravenous contrast. RADIATION DOSE REDUCTION: This exam was performed according to the departmental dose-optimization program which includes automated exposure control, adjustment of the mA and/or kV according to patient size and/or use of iterative reconstruction technique. COMPARISON:  None Available. FINDINGS: Brain: Normal anatomic configuration of the brain. Mild periventricular white matter changes are present likely the sequela small vessel ischemia. Tiny remote lacunar infarct noted within the right frontal subcortical white matter. No acute intracranial hemorrhage or infarct. No mass effect or midline shift no abnormal intra or extra-axial mass lesion. Ventricular size is normal. Cerebellum is unremarkable. Vascular: No hyperdense vessel or unexpected calcification. Skull: Normal. Negative for fracture or focal lesion. Sinuses/Orbits: No acute finding. Other: Mastoid air cells and middle ear cavities are clear. IMPRESSION: 1. No acute intracranial abnormality. 2. Mild senescent change. 3. Tiny remote lacunar infarct within the right frontal subcortical white matter. Electronically Signed   By: Helyn Numbers M.D.   On: 09/20/2023 01:45   CT CHEST ABDOMEN PELVIS W CONTRAST Result Date: 09/19/2023 CLINICAL DATA:  Sepsis fever EXAM: CT CHEST, ABDOMEN, AND PELVIS WITH CONTRAST TECHNIQUE: Multidetector CT imaging of the chest, abdomen and pelvis was performed following the standard protocol during bolus administration of intravenous contrast. RADIATION DOSE REDUCTION: This exam was performed  according to the departmental dose-optimization program which includes automated exposure control, adjustment of the mA and/or kV according to patient size and/or use of iterative reconstruction technique. CONTRAST:  OMNIPAQUE IOHEXOL 300 MG/ML  SOLN COMPARISON:  Chest x-ray 09/19/2023, CT 04/28/2021, 02/20/2019 FINDINGS: CT CHEST FINDINGS Cardiovascular: Mild aortic atherosclerosis. No aneurysm. Mitral annular calcification. Aortic valvular calcification. Borderline cardiomegaly. No pericardial effusion Mediastinum/Nodes: Patent trachea. No thyroid mass. No suspicious lymph nodes. Esophagus within normal limits Lungs/Pleura: No acute airspace disease, pleural effusion or pneumothorax. Musculoskeletal: Sternum appears intact. There are degenerative changes of the spine. No acute osseous abnormality CT ABDOMEN PELVIS FINDINGS Hepatobiliary: Liver cirrhosis.  No calcified gallstone or biliary dilatation Pancreas: Poorly visible, presumably due to marked atrophy. No inflammation. Spleen: Normal in size without focal abnormality. Adrenals/Urinary Tract: Adrenal glands are normal. Kidneys show no hydronephrosis. Renal cysts for which no imaging follow-up is recommended. Mild left greater than right perinephric stranding. Possible areas of cortical hypoenhancement involving the kidneys on delayed views but limited by artifact from the patient's arms. There is also motion degradation. The bladder is unremarkable Stomach/Bowel: The stomach is nonenlarged. There is no dilated small bowel. No acute bowel Saathoff thickening. Diverticular disease of the colon. Vascular/Lymphatic: Mild aortic atherosclerosis. No aneurysm. No suspicious lymph nodes. Gastroesophageal varices. Collateral vessels in the left upper quadrant. Reproductive: Prostate is unremarkable. Other: Negative for pelvic effusion or free air. Mild skin thickening of the right anterior abdominal [place] Musculoskeletal: Degenerative changes. No acute or suspicious  osseous abnormality. IMPRESSION: 1. No CT evidence for acute intrathoracic abnormality. 2. Liver cirrhosis with evidence of portal hypertension including gastroesophageal varices. 3. Diverticular disease of the colon without acute inflammatory process. 4. Mild left greater than right perinephric stranding with possible areas of cortical hypoenhancement involving the kidneys on delayed views but limited by artifact from the patient's arms and motion degradation. Correlate with urinalysis to assess for UTI/pyelonephritis Aortic Atherosclerosis (ICD10-I70.0). Electronically Signed   By: Jasmine Pang M.D.   On: 09/19/2023 21:24   DG Chest Portable 1 View Result Date: 09/19/2023 CLINICAL DATA:  Generalized weakness and fever today. EXAM: PORTABLE CHEST 1 VIEW COMPARISON:  03/10/2022 FINDINGS: The cardiac silhouette remains mildly enlarged. Decreased prominence of the pulmonary vasculature. Mildly prominent interstitial markings with an appearance compatible with chronic interstitial lung disease without superimposed pulmonary edema. No airspace consolidation or pleural fluid. Left shoulder prosthesis. Lower thoracic and upper lumbar spine degenerative changes. IMPRESSION: 1. No acute abnormality. 2. Mild cardiomegaly and mild chronic interstitial lung disease. Electronically Signed   By: Beckie Salts M.D.   On: 09/19/2023 18:24  [2 weeks]  Assessment:   Tony Cantu is a 73 year old male with history of aortic stenosis, osteoarthritis, BPH, cirrhosis, portal hypertension, type 2 diabetes, paroxysmal A-fib on Eliquis, history of prior gastric ulcer, history of portal hypertensive gastropathy and GAVE but no history of esophageal or gastric varices on last EGD,hypertension, OSA on CPAP who presented to the ED with fever and generalized weakness and episode of melanotic diarrhea in the ED.  GI consulted for further evaluation.  Melena/worsening anemia: History of IDA, sees hematology and received 3 IV iron  infusions this month.  Hemoglobin 10.2 on admission, episode of melena in the ED, heme positive.  Hemoglobin has remained stable.  Wife denies melena or rectal bleeding prior to presentation.  No documented bleeding since in the ICU.  EGD February 2024 with portal hypertension and mild nonbleeding GAVE.  No evidence of gastric or esophageal varices however current CT suggestive of gastroesophageal varices.  Last dose of Eliquis Monday night.   Cirrhosis: appears well compensated. EGD early 2024 with no varices, though current CT concerning for gastroesophageal varices. Melena while in the ED. No further overt gi bleeding noted. Hgb has remained stable. On rocephin for sepsis. No ascites on exam. Update MELD labs tomorrow. Consider EGD when stable.   Altered mental status: improved, alert and oriented today but somewhat sluggish. Likely due to metabolic encephalopathy in setting of acute illness/febrile state. Concern for right facial droop, RUE/RLE weakness per attending. CT head and MRI brain without acute findings. Ammonia level low. ST evaluation pending. Patient is  NPO.    Plan:   Continue IV PPI BID. Continue IV rocephin. Consider EGD when medically stable, prior to discharge.  Trend H/H. Check MELD labs tomorrow.  Await ST evaluation. Monitor for overt gi bleeding.    LOS: 1 day   Leanna Battles. Dixon Boos [facility] [phone] 1/23/202510:14 AM

## 2023-09-21 NOTE — Evaluation (Signed)
Clinical/Bedside Swallow Evaluation Patient Details  Name: Tony Cantu MRN: [id number] Date of Birth: [birth year]  Today's Date: 09/21/2023 Time: SLP Start Time (ACUTE ONLY): 1405 SLP Stop Time (ACUTE ONLY): 1431 SLP Time Calculation (min) (ACUTE ONLY): 26 min  Past Medical History:  Past Medical History:  Diagnosis Date   Aortic atherosclerosis (HCC)    Aortic stenosis    Arthritis    Asthma    BPH (benign prostatic hypertrophy)    Cirrhosis (HCC) 2016   Colon polyps    Diabetes mellitus without complication (HCC)    TYPE 1    Diverticulosis    Dysrhythmia 04/19/2021   A-fib noted with possible A-fib RVR   Gastric ulcer    Gastric varices    right   Gastritis    GERD (gastroesophageal reflux disease)    Headache    History- pt states these were migraines that occurred in the 1980's   Heart murmur    History of kidney stones    Hx of adenomatous colonic polyps    Hypertension    Iron deficiency anemia due to chronic blood loss 08/16/2023   PONV (postoperative nausea and vomiting)    pt states he has never had post op nausea or vomiting   Portal hypertensive gastropathy (HCC)    Renal cyst, right    Seizures (HCC)    HYPOGLYCEMIC LAST 1 AND 1/2 YRS AGO   Sleep apnea    uses CPAP nightly   Testicle trouble    one testicle BORN WITH   Tubular adenoma of colon    Past Surgical History:  Past Surgical History:  Procedure Laterality Date   BACK SURGERY     89  LOWER    CARPAL TUNNEL RELEASE Right 10/13/2015   Procedure: RIGHT CARPAL TUNNEL RELEASE;  Surgeon: Cindee Salt, MD;  Location: [facility];  Service: Orthopedics;  Laterality: Right;   CARPAL TUNNEL RELEASE Left 07/05/2016   Procedure: LEFT CARPAL TUNNEL RELEASE;  Surgeon: Cindee Salt, MD;  Location: [facility];  Service: Orthopedics;  Laterality: Left;   COLONOSCOPY WITH ESOPHAGOGASTRODUODENOSCOPY (EGD)  10/05/2022   DRUG INDUCED ENDOSCOPY N/A 07/02/2021   Procedure: DRUG INDUCED  SLEEP ENDOSCOPY;  Surgeon: Christia Reading, MD;  Location: [facility] OR;  Service: ENT;  Laterality: N/A;   IMPLANTATION OF HYPOGLOSSAL NERVE STIMULATOR Right 10/01/2021   Procedure: IMPLANTATION OF HYPOGLOSSAL NERVE STIMULATOR;  Surgeon: Christia Reading, MD;  Location: [facility] OR;  Service: ENT;  Laterality: Right;   INGUINAL HERNIA REPAIR  2003   right    KNEE ARTHROSCOPY Right 03/03/2016   LUMBAR DISC SURGERY  03/1995   Dr. Daleen Squibb, discectomy   LUMBAR LAMINECTOMY/DECOMPRESSION MICRODISCECTOMY Right 10/06/2016   Procedure: RIGHT LUMBAR THREE - LUMBAR FOUR  LAMINECTOMY, FORAMINOTOMY AND MICRODISCECTOMY;  Surgeon: Shirlean Kelly, MD;  Location: [facility] OR;  Service: Neurosurgery;  Laterality: Right;   SHOULDER SURGERY  11/28/2005   left partial   SHOULDER SURGERY  07/14/2006   RIGHT   TONSILLECTOMY  AGE 75 OR 4   TOTAL KNEE ARTHROPLASTY Right 03/07/2022   Procedure: RIGHT TOTAL KNEE ARTHROPLASTY;  Surgeon: Gean Birchwood, MD;  Location: [facility];  Service: Orthopedics;  Laterality: Right;   TOTAL KNEE ARTHROPLASTY Left 06/06/2022   Procedure: LEFT TOTAL KNEE ARTHROPLASTY;  Surgeon: Gean Birchwood, MD;  Location: [facility];  Service: Orthopedics;  Laterality: Left;   TOTAL SHOULDER ARTHROPLASTY Right 11/01/2018   Procedure: RIGHT SHOULDER REVISION TO REVERSE TOTAL SHOULDER;  Surgeon: Jones Broom, MD;  Location:  [facility];  Service: Orthopedics;  Laterality: Right;  CHOICE ANESTHESIA WITH INTERSCALENE BLOCK EXPAREL, NEEDS RNFA   HPI:  Tony Cantu is a 73 y.o. male with hx of type 1 diabetes, A-fib on Eliquis, cirrhosis, seizure disorder, aortic stenosis, hypertension, peptic ulcer disease, OA with bilateral knee replacement, polyclonal gammopathy, OSA on CPAP, who was brought in from home due to progressive encephalopathy.  He was admitted for sepsis, POA, with unknown source, but is now noted to have gram-positive bacteremia.  He is also noted to have associated acute metabolic encephalopathy. BSE requested     Assessment / Plan / Recommendation  Clinical Impression  Clinical swallow evaluation completed with Pt seated up in chair. Pt's lingual surfaces very dry and vocal quality weak. Oral care provided and improved voicing and appearance of tongue. Oral motor exam reveals no facial asymmetry, mild generalized weakness. Pt denies difficulty swallowing and endorses feeling hungry and thirsty. Pt assessed with ice chips, thin water via cup/straw, puree, and graham crackers. Pt without overt signs or symptoms of aspiration and no reports of globus. Pt will likely need assist with feeding due to weakness. Recommend regular textures and thin liquids with standard aspiration and reflux precautions and PO medication whole with water. Above to RN and MD. SLP will f/u x 1 while in acute setting. SLP Visit Diagnosis: Dysphagia, unspecified (R13.10)    Aspiration Risk  Mild aspiration risk    Diet Recommendation Regular;Thin liquid    Liquid Administration via: Cup;Straw Medication Administration: Whole meds with liquid Supervision: Staff to assist with self feeding;Full supervision/cueing for compensatory strategies Compensations: Slow rate;Small sips/bites Postural Changes: Seated upright at 90 degrees;Remain upright for at least 30 minutes after po intake    Other  Recommendations Oral Care Recommendations: Oral care BID;Staff/trained caregiver to provide oral care    Recommendations for follow up therapy are one component of a multi-disciplinary discharge planning process, led by the attending physician.  Recommendations may be updated based on patient status, additional functional criteria and insurance authorization.  Follow up Recommendations No SLP follow up      Assistance Recommended at Discharge    Functional Status Assessment Patient has had a recent decline in their functional status and demonstrates the ability to make significant improvements in function in a reasonable and predictable  amount of time.  Frequency and Duration min 2x/week  1 week       Prognosis Prognosis for improved oropharyngeal function: Good      Swallow Study   General Date of Onset: 09/19/23 HPI: Tony Cantu is a 73 y.o. male with hx of type 1 diabetes, A-fib on Eliquis, cirrhosis, seizure disorder, aortic stenosis, hypertension, peptic ulcer disease, OA with bilateral knee replacement, polyclonal gammopathy, OSA on CPAP, who was brought in from home due to progressive encephalopathy.  He was admitted for sepsis, POA, with unknown source, but is now noted to have gram-positive bacteremia.  He is also noted to have associated acute metabolic encephalopathy. BSE requested Type of Study: Bedside Swallow Evaluation Diet Prior to this Study: NPO Temperature Spikes Noted: No Respiratory Status: Room air History of Recent Intubation: No Behavior/Cognition: Alert;Cooperative;Pleasant mood Oral Cavity Assessment: Dry Oral Care Completed by SLP: Yes Oral Cavity - Dentition: Adequate natural dentition (partial/bridge) Vision: Functional for self-feeding Self-Feeding Abilities: Needs assist (Pt is very weak) Patient Positioning: Upright in chair Baseline Vocal Quality: Low vocal intensity;Normal Volitional Cough: Strong Volitional Swallow: Able to elicit    Oral/Motor/Sensory Function Overall Oral Motor/Sensory Function:  Within functional limits   Ice Chips Ice chips: Within functional limits Presentation: Spoon   Thin Liquid Thin Liquid: Within functional limits Presentation: Cup;Self Fed;Straw    Nectar Thick Nectar Thick Liquid: Not tested   Honey Thick Honey Thick Liquid: Not tested   Puree Puree: Within functional limits Presentation: Spoon   Solid     Solid: Within functional limits Presentation: Self Fed     Thank you,  Havery Moros, CCC-SLP [phone]  Tony Cantu 09/21/2023,2:37 PM

## 2023-09-21 NOTE — Plan of Care (Signed)
  Problem: Acute Rehab OT Goals (only OT should resolve) Goal: Pt. Will Perform Grooming Flowsheets (Taken 09/21/2023 1602) Pt Will Perform Grooming:  with modified independence  sitting Goal: Pt. Will Perform Upper Body Dressing Flowsheets (Taken 09/21/2023 1602) Pt Will Perform Upper Body Dressing:  with modified independence  sitting Goal: Pt. Will Perform Lower Body Dressing Flowsheets (Taken 09/21/2023 1602) Pt Will Perform Lower Body Dressing:  with mod assist  sitting/lateral leans Goal: Pt. Will Transfer To Toilet Flowsheets (Taken 09/21/2023 1602) Pt Will Transfer to Toilet:  with contact guard assist  with min assist  stand pivot transfer Goal: Pt. Will Perform Toileting-Clothing Manipulation Flowsheets (Taken 09/21/2023 1602) Pt Will Perform Toileting - Clothing Manipulation and hygiene:  with contact guard assist  sitting/lateral leans Goal: Pt/Caregiver Will Perform Home Exercise Program Flowsheets (Taken 09/21/2023 1602) Pt/caregiver will Perform Home Exercise Program:  Increased ROM  Increased strength  Both right and left upper extremity  Independently  Jendayi Berling OT, MOT

## 2023-09-22 ENCOUNTER — Encounter (HOSPITAL_COMMUNITY): Payer: Self-pay | Admitting: Anesthesiology

## 2023-09-22 ENCOUNTER — Other Ambulatory Visit (HOSPITAL_COMMUNITY): Payer: BC Managed Care – PPO

## 2023-09-22 ENCOUNTER — Encounter (HOSPITAL_COMMUNITY)
Admission: EM | Disposition: A | Discharge status: Skilled Nursing Facility | Payer: Self-pay | Source: Home / Self Care | Attending: Internal Medicine

## 2023-09-22 ENCOUNTER — Other Ambulatory Visit (HOSPITAL_COMMUNITY): Payer: Self-pay | Admitting: *Deleted

## 2023-09-22 DIAGNOSIS — I350 Nonrheumatic aortic (valve) stenosis: Secondary | ICD-10-CM | POA: Diagnosis not present

## 2023-09-22 DIAGNOSIS — G9341 Metabolic encephalopathy: Secondary | ICD-10-CM | POA: Diagnosis not present

## 2023-09-22 DIAGNOSIS — I493 Ventricular premature depolarization: Secondary | ICD-10-CM

## 2023-09-22 DIAGNOSIS — A419 Sepsis, unspecified organism: Secondary | ICD-10-CM | POA: Diagnosis not present

## 2023-09-22 DIAGNOSIS — A409 Streptococcal sepsis, unspecified: Secondary | ICD-10-CM

## 2023-09-22 DIAGNOSIS — D62 Acute posthemorrhagic anemia: Secondary | ICD-10-CM | POA: Diagnosis not present

## 2023-09-22 DIAGNOSIS — I4729 Other ventricular tachycardia: Secondary | ICD-10-CM | POA: Diagnosis not present

## 2023-09-22 DIAGNOSIS — R6520 Severe sepsis without septic shock: Secondary | ICD-10-CM | POA: Diagnosis not present

## 2023-09-22 LAB — CULTURE, BLOOD (ROUTINE X 2)

## 2023-09-22 LAB — HEPATITIS PANEL, ACUTE
HCV Ab: NONREACTIVE
Hep A IgM: NONREACTIVE
Hep B C IgM: NONREACTIVE
Hepatitis B Surface Ag: NONREACTIVE

## 2023-09-22 LAB — CBC
HCT: 30.1 % — ABNORMAL LOW (ref 39.0–52.0)
Hemoglobin: 9.5 g/dL — ABNORMAL LOW (ref 13.0–17.0)
MCH: 35.1 pg — ABNORMAL HIGH (ref 26.0–34.0)
MCHC: 31.6 g/dL (ref 30.0–36.0)
MCV: 111.1 fL — ABNORMAL HIGH (ref 80.0–100.0)
Platelets: 81 10*3/uL — ABNORMAL LOW (ref 150–400)
RBC: 2.71 MIL/uL — ABNORMAL LOW (ref 4.22–5.81)
RDW: 17.7 % — ABNORMAL HIGH (ref 11.5–15.5)
WBC: 4.4 10*3/uL (ref 4.0–10.5)
nRBC: 0 % (ref 0.0–0.2)

## 2023-09-22 LAB — MAGNESIUM: Magnesium: 1.8 mg/dL (ref 1.7–2.4)

## 2023-09-22 LAB — COMPREHENSIVE METABOLIC PANEL
ALT: 78 U/L — ABNORMAL HIGH (ref 0–44)
AST: 143 U/L — ABNORMAL HIGH (ref 15–41)
Albumin: 2.4 g/dL — ABNORMAL LOW (ref 3.5–5.0)
Alkaline Phosphatase: 66 U/L (ref 38–126)
Anion gap: 7 (ref 5–15)
BUN: 17 mg/dL (ref 8–23)
CO2: 23 mmol/L (ref 22–32)
Calcium: 8.5 mg/dL — ABNORMAL LOW (ref 8.9–10.3)
Chloride: 108 mmol/L (ref 98–111)
Creatinine, Ser: 0.88 mg/dL (ref 0.61–1.24)
GFR, Estimated: >60 mL/min (ref >60)
Glucose, Bld: 262 mg/dL — ABNORMAL HIGH (ref 70–99)
Potassium: 3.7 mmol/L (ref 3.5–5.1)
Sodium: 138 mmol/L (ref 135–145)
Total Bilirubin: 0.7 mg/dL (ref 0.0–1.2)
Total Protein: 5.4 g/dL — ABNORMAL LOW (ref 6.5–8.1)

## 2023-09-22 LAB — GLUCOSE, CAPILLARY
Glucose-Capillary: 176 mg/dL — ABNORMAL HIGH (ref 70–99)
Glucose-Capillary: 207 mg/dL — ABNORMAL HIGH (ref 70–99)
Glucose-Capillary: 227 mg/dL — ABNORMAL HIGH (ref 70–99)
Glucose-Capillary: 233 mg/dL — ABNORMAL HIGH (ref 70–99)
Glucose-Capillary: 273 mg/dL — ABNORMAL HIGH (ref 70–99)
Glucose-Capillary: 316 mg/dL — ABNORMAL HIGH (ref 70–99)

## 2023-09-22 LAB — PROTIME-INR
INR: 1.1 (ref 0.8–1.2)
Prothrombin Time: 14 s (ref 11.4–15.2)

## 2023-09-22 SURGERY — CANCELLED PROCEDURE
Anesthesia: Monitor Anesthesia Care

## 2023-09-22 MED ORDER — POTASSIUM CHLORIDE CRYS ER 20 MEQ PO TBCR
20.0000 meq | EXTENDED_RELEASE_TABLET | Freq: Once | ORAL | Status: AC
  Administered 2023-09-22: 20 meq via ORAL
  Filled 2023-09-22: qty 1

## 2023-09-22 MED ORDER — MAGNESIUM SULFATE 2 GM/50ML IV SOLN
2.0000 g | Freq: Once | INTRAVENOUS | Status: AC
  Administered 2023-09-22: 2 g via INTRAVENOUS
  Filled 2023-09-22: qty 50

## 2023-09-22 NOTE — Progress Notes (Signed)
PROGRESS NOTE    Tony Cantu  ZOX:[id number] DOB: 15-Dec-1950 DOA: 09/19/2023 PCP: [name], DO   Brief Narrative:    Tony Cantu is a 73 y.o. male with hx of type 1 diabetes, A-fib on Eliquis, cirrhosis, seizure disorder, aortic stenosis, hypertension, peptic ulcer disease, OA with bilateral knee replacement, polyclonal gammopathy, OSA on CPAP, who was brought in from home due to progressive encephalopathy.  He was admitted for sepsis, POA, with noted strep bacteremia.  He is also noted to have associated acute metabolic encephalopathy-which has now resolved.  Brain MRI with no acute findings and 2D echocardiogram with no findings of endocarditis.  ID consulted to assist with management due to noted strep bacteremia.  Patient seen by GI and cardiology and plan was to initially try to perform EGD and TEE around the same time, but unfortunately due to severe aortic stenosis, he has not a candidate for sed[facility] at [facility] and therefore will be transferred to [facility] to be followed by GI, cardiology, and ID for further evaluation and management.  Assessment & Plan:   Principal Problem:   Sepsis (HCC) Active Problems:   Stroke-like symptom   Acute encephalopathy   Transaminitis   Renal lesion   Melena   Streptococcal bacteremia  Assessment and Plan:   Sepsis, POA secondary to strep bacteremia -Continue on Rocephin for strep bacteremia with final ID and sensitivities pending -TTE with no findings of vegetations -Appreciate ID recommendations -Repeat blood cultures negative thus far -Plans for TEE per cardiology at [facility] and could not have sedation at [facility] due to severe aortic stenosis   Right-sided weakness -Brain MRI negative -PT/OT recommending SNF on discharge   Progressive encephalopathy-resolved -Brain MRI negative  Melena/worsening anemia -Continue PPI twice daily -Continue to hold anticoagulation with last dose of Eliquis Monday  night -Appreciate GI evaluation at [facility] for EGD.  Patient was too high risk for procedure at [facility] given severe aortic stenosis.   Question of hypodense lesions in kidneys - Consider renal infarct, Spoke with Radiologist on call who reviewed imaging and feels that renal arteries are well opacified to the level of renal sinus. The hypodense lesions are not well demarcated wedge typical of classic renal infarcts. So at this point indeterminate lesions. Will hold off on CTA considering renal arteries with no large embolism / dissection etc to explain.    Severe aortic valve stenosis - Preserved LVEF and mild Tetzloff motion abnormality changes noted on 2D echocardiogram -Further cardiology evaluation pending with TEE at [facility].  Would likely require outpatient structural cardiology referral.   Acute liver injury, hepatocellular pattern Minimal elevation AST 64, ALT 61.  CT without acute hepatobiliary pathology.  Suspect this is secondary to underlying sepsis - Trend LFT   Chronic medical problems: Type 1 diabetes: Note UA positive ketone, no anion gap.  Blood gas, beta hydroxybutyrate pending.  Takes a variable amount of insulin at home, recent PCP note from 1/10 noting decrease in glargine to 40 units daily, also takes sliding scale.  Reduce dose Semglee here 30 units daily, aspart 5 units 3 times daily with meals scheduled, sliding scale for moderate.  A-fib: Holding home Eliquis pending stroke evaluation, hold diltiazem in setting of sepsis/borderline pressures History of cirrhosis: Check ammonia to rule out HE. Trending liver enzymes. Previously compensated. Hx Portal HTN gastropathy / GAVE, no varices on last EGD. No ascites.  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   Body mass index is 31.84 kg/m.  Obesity class I, benefit from continued weight loss.    DVT prophylaxis:SCDs, Eliquis held for now Code Status: Full Family Communication: Wife on phone 1/24 Disposition Plan:  Status is: Inpatient Remains inpatient appropriate because: Need for IV medications.  Consultants:  GI ID  Procedures:  None  Antimicrobials:  Anti-infectives (From admission, onward)    Start     Dose/Rate Route Frequency Ordered Stop   09/20/23 2200  vancomycin (VANCOREADY) IVPB 1500 mg/300 mL  Status:  Discontinued        1,500 mg 150 mL/hr over 120 Minutes Intravenous Every 24 hours 09/20/23 1020 09/20/23 1529   09/20/23 2200  cefTRIAXone (ROCEPHIN) 2 g in sodium chloride 0.9 % 100 mL IVPB        2 g 200 mL/hr over 30 Minutes Intravenous Every 24 hours 09/20/23 1529     09/20/23 0600  ceFEPIme (MAXIPIME) 2 g in sodium chloride 0.9 % 100 mL IVPB  Status:  Discontinued        2 g 200 mL/hr over 30 Minutes Intravenous Every 8 hours 09/20/23 0038 09/20/23 1529   09/20/23 0045  metroNIDAZOLE (FLAGYL) IVPB 500 mg  Status:  Discontinued        500 mg 100 mL/hr over 60 Minutes Intravenous Every 12 hours 09/20/23 0033 09/20/23 1529   09/19/23 2200  vancomycin (VANCOREADY) IVPB 2000 mg/400 mL        2,000 mg 200 mL/hr over 120 Minutes Intravenous  Once 09/19/23 2146 09/20/23 0103   09/19/23 2145  ceFEPIme (MAXIPIME) 2 g in sodium chloride 0.9 % 100 mL IVPB        2 g 200 mL/hr over 30 Minutes Intravenous  Once 09/19/23 2143 09/19/23 2247      Subjective: Patient seen and evaluated today with no acute events or concerns noted overnight.  He appears to overall be doing well and mentation has improved.  He denies any pain complaints.  He is  thirsty and asking for something to drink.  Objective: Vitals:   09/21/23 1502 09/21/23 2202 09/22/23 0449 09/22/23 0600  BP: (!) 105/59 121/73 98/65 (!) 113/57  Pulse: 77 84 70 (!) 50  Resp: 17 18 19 16   Temp: 98.3 F (36.8 C) 97.6 F (36.4 C) 97.6 F (36.4 C)   TempSrc: Oral Oral Oral   SpO2: 99% 98% 98% 96%  Weight:      Height:        Intake/Output Summary (Last 24 hours) at 09/22/2023 1109 Last data filed at 09/22/2023 1051 Gross per 24 hour  Intake 440 ml  Output 1400 ml  Net -960 ml   Filed Weights   09/19/23 1709 09/20/23 1404  Weight: 95 kg 92.4 kg    Examination:  General exam: Somnolent but arousable Respiratory system: Clear to auscultation. Respiratory effort normal. Cardiovascular system: S1 & S2 heard, RRR.  Gastrointestinal system: Abdomen is soft Central nervous system: Somnolent but arousable Extremities: No edema Skin: No significant lesions noted Psychiatry: Flat affect.    Data Reviewed: I have personally reviewed following labs and imaging studies  CBC: Recent  Labs  Lab 09/19/23 1848 09/20/23 0258 09/21/23 0507 09/22/23 0512  WBC 8.9 13.4* 6.4 4.4  NEUTROABS 7.8*  --   --   --   HGB 10.2* 10.2* 9.8* 9.5*  HCT 31.4* 32.1* 30.9* 30.1*  MCV 111.7* 114.2* 112.4* 111.1*  PLT 126* 105* 87* 81*   Basic Metabolic Panel: Recent Labs  Lab 09/19/23 1848 09/20/23 0258 09/21/23 0507 09/22/23 0512  NA 140 141 143 138  K 3.9 4.3 3.6 3.7  CL 109 111 115* 108  CO2 20* 20* 23 23  GLUCOSE 161* 266* 225* 262*  BUN 14 15 18 17   CREATININE 0.98 1.13 0.95 0.88  CALCIUM 9.2 8.8* 8.8* 8.5*  MG  --  1.6* 2.1 1.8  PHOS  --  3.2  --   --    GFR: Estimated Creatinine Clearance: 82.5 mL/min (by C-G formula based on SCr of 0.88 mg/dL). Liver Function Tests: Recent Labs  Lab 09/19/23 1848 09/20/23 0258 09/21/23 0507 09/22/23 0512  AST 64* 65* 164* 143*  ALT 61* 54* 80* 78*  ALKPHOS 104 80 70 66  BILITOT 1.2 1.6* 0.7 0.7  PROT 6.6 6.1* 5.6*  5.4*  ALBUMIN 3.1* 2.7* 2.5* 2.4*   No results for input(s): "LIPASE", "AMYLASE" in the last 168 hours. Recent Labs  Lab 09/20/23 0532  AMMONIA 13   Coagulation Profile: Recent Labs  Lab 09/20/23 1321  INR 1.4*   Cardiac Enzymes: No results for input(s): "CKTOTAL", "CKMB", "CKMBINDEX", "TROPONINI" in the last 168 hours. BNP (last 3 results) No results for input(s): "PROBNP" in the last 8760 hours. HbA1C: Recent Labs    09/19/23 1848  HGBA1C 4.4*   CBG: Recent Labs  Lab 09/21/23 1611 09/21/23 2005 09/22/23 0003 09/22/23 0426 09/22/23 0744  GLUCAP 250* 265* 227* 233* 207*   Lipid Profile: No results for input(s): "CHOL", "HDL", "LDLCALC", "TRIG", "CHOLHDL", "LDLDIRECT" in the last 72 hours. Thyroid Function Tests: No results for input(s): "TSH", "T4TOTAL", "FREET4", "T3FREE", "THYROIDAB" in the last 72 hours. Anemia Panel: No results for input(s): "VITAMINB12", "FOLATE", "FERRITIN", "TIBC", "IRON", "RETICCTPCT" in the last 72 hours. Sepsis Labs: Recent Labs  Lab 09/19/23 1848 09/19/23 2045  LATICACIDVEN 2.3* 1.8    Recent Results (from the past 240 hours)  Blood culture (routine x 2)     Status: Abnormal (Preliminary result)   Collection Time: 09/19/23  6:48 PM   Specimen: BLOOD LEFT HAND  Result Value Ref Range Status   Specimen Description   Final    BLOOD LEFT HAND Performed at [facility], [address], [place], [place] [address]    Special Requests   Final    BOTTLES DRAWN AEROBIC ONLY Blood Culture results may not be optimal due to an inadequate volume of blood received in culture bottles Performed at [facility], [address], [place], [place] [address]    Culture  Setup Time   Final    GRAM POSITIVE COCCI AEROBIC BOTTLE ONLY Gram Stain Report Called to,Read Back By and Verified With: KRISTY EDWARDS AT 0859 09/20/2023 BY A. SNYDER GRAM STAIN REVIEWED-AGREE WITH RESULT DRT    Culture (A)  Final    STREPTOCOCCUS  SPECIES IDENTIFICATION TO FOLLOW Performed at [facility], [address], [place], [place] [address]    Report Status PENDING  Incomplete  Blood culture (routine x 2)     Status: Abnormal (Preliminary result)   Collection Time: 09/19/23  6:48 PM   Specimen: BLOOD LEFT HAND  Result Value  Ref Range Status   Specimen Description   Final    BLOOD LEFT HAND Performed at [facility], [address] [place], [place] [address]    Special Requests   Final    BOTTLES DRAWN AEROBIC ONLY Blood Culture results may not be optimal due to an inadequate volume of blood received in culture bottles Performed at [facility], [address] [place], [place] [address]    Culture  Setup Time   Final    GRAM POSITIVE COCCI AEROBIC BOTTLE ONLY Gram Stain Report Called to,Read Back By and Verified With: KRISTY EDWARDS AT 0859 09/20/2023 BY A. SNYDER GRAM STAIN REVIEWED-AGREE WITH RESULT DRT CRITICAL RESULT CALLED TO, READ BACK BY AND VERIFIED WITH: PHARMD F.WILSON [id number] @ 1518 FH    Culture (A)  Final    STREPTOCOCCUS SPECIES IDENTIFICATION AND SUSCEPTIBILITIES TO FOLLOW Performed at [facility], [address] [place], [place] [address]    Report Status PENDING  Incomplete  Blood Culture ID Panel (Reflexed)     Status: Abnormal   Collection Time: 09/19/23  6:48 PM  Result Value Ref Range Status   Enterococcus faecalis NOT DETECTED NOT DETECTED Final   Enterococcus Faecium NOT DETECTED NOT DETECTED Final   Listeria monocytogenes NOT DETECTED NOT DETECTED Final   Staphylococcus species NOT DETECTED NOT DETECTED Final   Staphylococcus aureus (BCID) NOT DETECTED NOT DETECTED Final   Staphylococcus epidermidis NOT DETECTED NOT DETECTED Final   Staphylococcus lugdunensis NOT DETECTED NOT DETECTED Final   Streptococcus species DETECTED (A) NOT DETECTED Final    Comment: Not Enterococcus species, Streptococcus agalactiae, Streptococcus pyogenes, or Streptococcus  pneumoniae. CRITICAL RESULT CALLED TO, READ BACK BY AND VERIFIED WITH: PHARMD F.WILSON [id number] @ 1518 FH    Streptococcus agalactiae NOT DETECTED NOT DETECTED Final   Streptococcus pneumoniae NOT DETECTED NOT DETECTED Final   Streptococcus pyogenes NOT DETECTED NOT DETECTED Final   A.calcoaceticus-baumannii NOT DETECTED NOT DETECTED Final   Bacteroides fragilis NOT DETECTED NOT DETECTED Final   Enterobacterales NOT DETECTED NOT DETECTED Final   Enterobacter cloacae complex NOT DETECTED NOT DETECTED Final   Escherichia coli NOT DETECTED NOT DETECTED Final   Klebsiella aerogenes NOT DETECTED NOT DETECTED Final   Klebsiella oxytoca NOT DETECTED NOT DETECTED Final   Klebsiella pneumoniae NOT DETECTED NOT DETECTED Final   Proteus species NOT DETECTED NOT DETECTED Final   Salmonella species NOT DETECTED NOT DETECTED Final   Serratia marcescens NOT DETECTED NOT DETECTED Final   Haemophilus influenzae NOT DETECTED NOT DETECTED Final   Neisseria meningitidis NOT DETECTED NOT DETECTED Final   Pseudomonas aeruginosa NOT DETECTED NOT DETECTED Final   Stenotrophomonas maltophilia NOT DETECTED NOT DETECTED Final   Candida albicans NOT DETECTED NOT DETECTED Final   Candida auris NOT DETECTED NOT DETECTED Final   Candida glabrata NOT DETECTED NOT DETECTED Final   Candida krusei NOT DETECTED NOT DETECTED Final   Candida parapsilosis NOT DETECTED NOT DETECTED Final   Candida tropicalis NOT DETECTED NOT DETECTED Final   Cryptococcus neoformans/gattii NOT DETECTED NOT DETECTED Final    Comment: Performed at [facility], [address], [place], [place] [address]  Resp panel by RT-PCR (RSV, Flu A&B, Covid) Anterior Nasal Swab     Status: None   Collection Time: 09/19/23  7:30 PM   Specimen: Anterior Nasal Swab  Result Value Ref Range Status   SARS Coronavirus 2 by RT PCR NEGATIVE NEGATIVE Final    Comment: (NOTE) SARS-CoV-2 target nucleic acids  are NOT DETECTED.  The SARS-CoV-2 RNA is  generally detectable in upper respiratory specimens during the acute phase of infection. The lowest concentration of SARS-CoV-2 viral copies this assay can detect is 138 copies/mL. A negative result does not preclude SARS-Cov-2 infection and should not be used as the sole basis for treatment or other patient management decisions. A negative result may occur with  improper specimen collection/handling, submission of specimen other than nasopharyngeal swab, presence of viral mutation(s) within the areas targeted by this assay, and inadequate number of viral copies(<138 copies/mL). A negative result must be combined with clinical observations, patient history, and epidemiological information. The expected result is Negative.  Fact Sheet for Patients:  BloggerCourse.com  Fact Sheet for Healthcare Providers:  SeriousBroker.it  This test is no t yet approved or cleared by the [place] FDA and  has been authorized for detection and/or diagnosis of SARS-CoV-2 by FDA under an Emergency Use Authorization (EUA). This EUA will remain  in effect (meaning this test can be used) for the duration of the COVID-19 declaration under Section 564(b)(1) of the Act, 21 U.S.C.section 360bbb-3(b)(1), unless the authorization is terminated  or revoked sooner.       Influenza A by PCR NEGATIVE NEGATIVE Final   Influenza B by PCR NEGATIVE NEGATIVE Final    Comment: (NOTE) The Xpert Xpress SARS-CoV-2/FLU/RSV plus assay is intended as an aid in the diagnosis of influenza from Nasopharyngeal swab specimens and should not be used as a sole basis for treatment. Nasal washings and aspirates are unacceptable for Xpert Xpress SARS-CoV-2/FLU/RSV testing.  Fact Sheet for Patients: BloggerCourse.com  Fact Sheet for Healthcare Providers: SeriousBroker.it  This test is not yet approved or cleared by the [place] FDA and has been authorized for detection and/or diagnosis of SARS-CoV-2 by FDA under an Emergency Use Authorization (EUA). This EUA will remain in effect (meaning this test can be used) for the duration of the COVID-19 declaration under Section 564(b)(1) of the Act, 21 U.S.C. section 360bbb-3(b)(1), unless the authorization is terminated or revoked.     Resp Syncytial Virus by PCR NEGATIVE NEGATIVE Final    Comment: (NOTE) Fact Sheet for Patients: BloggerCourse.com  Fact Sheet for Healthcare Providers: SeriousBroker.it  This test is not yet approved or cleared by the [place] FDA and has been authorized for detection and/or diagnosis of SARS-CoV-2 by FDA under an Emergency Use Authorization (EUA). This EUA will remain in effect (meaning this test can be used) for the duration of the COVID-19 declaration under Section 564(b)(1) of the Act, 21 U.S.C. section 360bbb-3(b)(1), unless the authorization is terminated or revoked.  Performed at [facility], [address] [place], [place] [address]   Respiratory (~20 pathogens) panel by PCR     Status: None   Collection Time: 09/20/23  7:20 AM   Specimen: Nasopharyngeal Swab; Respiratory  Result Value Ref Range Status   Adenovirus NOT DETECTED NOT DETECTED Final   Coronavirus 229E NOT DETECTED NOT DETECTED Final    Comment: (NOTE) The Coronavirus on the Respiratory Panel, DOES NOT test for the novel  Coronavirus (2019 nCoV)    Coronavirus HKU1 NOT DETECTED NOT DETECTED Final   Coronavirus NL63 NOT DETECTED NOT DETECTED Final   Coronavirus OC43 NOT DETECTED NOT DETECTED Final   Metapneumovirus NOT DETECTED NOT DETECTED Final   Rhinovirus / Enterovirus NOT DETECTED NOT DETECTED Final   Influenza A NOT DETECTED NOT DETECTED Final   Influenza B NOT DETECTED NOT DETECTED Final   Parainfluenza Virus  1 NOT DETECTED NOT DETECTED Final   Parainfluenza Virus 2 NOT DETECTED NOT  DETECTED Final   Parainfluenza Virus 3 NOT DETECTED NOT DETECTED Final   Parainfluenza Virus 4 NOT DETECTED NOT DETECTED Final   Respiratory Syncytial Virus NOT DETECTED NOT DETECTED Final   Bordetella pertussis NOT DETECTED NOT DETECTED Final   Bordetella Parapertussis NOT DETECTED NOT DETECTED Final   Chlamydophila pneumoniae NOT DETECTED NOT DETECTED Final   Mycoplasma pneumoniae NOT DETECTED NOT DETECTED Final    Comment: Performed at [facility], [address] [place], [place] [address]  MRSA Next Gen by PCR, Nasal     Status: None   Collection Time: 09/20/23 12:35 PM   Specimen: Nasal Mucosa; Nasal Swab  Result Value Ref Range Status   MRSA by PCR Next Gen NOT DETECTED NOT DETECTED Final    Comment: (NOTE) The GeneXpert MRSA Assay (FDA approved for NASAL specimens only), is one component of a comprehensive MRSA colonization surveillance program. It is not intended to diagnose MRSA infection nor to guide or monitor treatment for MRSA infections. Test performance is not FDA approved in patients less than 56 years old. Performed at [facility], [address], [place], [place] [address]   Culture, blood (Routine X 2) w Reflex to ID Panel     Status: None (Preliminary result)   Collection Time: 09/20/23  4:55 PM   Specimen: BLOOD  Result Value Ref Range Status   Specimen Description BLOOD BLOOD LEFT ARM  Final   Special Requests   Final    BOTTLES DRAWN AEROBIC AND ANAEROBIC Blood Culture adequate volume   Culture   Final    NO GROWTH 2 DAYS Performed at [facility], [address], [place], [place] [address]    Report Status PENDING  Incomplete  Culture, blood (Routine X 2) w Reflex to ID Panel     Status: None (Preliminary result)   Collection Time: 09/20/23  4:55 PM   Specimen: BLOOD  Result Value Ref Range Status   Specimen Description BLOOD BLOOD RIGHT HAND  Final   Special Requests   Final    BOTTLES DRAWN AEROBIC AND ANAEROBIC Blood Culture adequate volume    Culture   Final    NO GROWTH 2 DAYS Performed at [facility], [address], [place], [place] [address]    Report Status PENDING  Incomplete         Radiology Studies: MR BRAIN WO CONTRAST Result Date: 09/20/2023 CLINICAL DATA:  Encephalopathy. Right-sided facial, arm, and leg weakness. Possible stroke. EXAM: MRI HEAD WITHOUT CONTRAST TECHNIQUE: Multiplanar, multiecho pulse sequences of the brain and surrounding structures were obtained without intravenous contrast. COMPARISON:  Head CT 09/20/2023 FINDINGS: Despite multiple attempts, the examination had to be discontinued prior to completion as the patient attempted to remove the head coil. Axial and coronal diffusion weighted imaging and a sagittal T1 sequence were obtained and suffer from variable, up to severe motion artifact. Brain: A repeated axial DWI sequence is of reasonable quality with only mild motion artifact, and there is no evidence of an acute infarct. No significant intracranial mass effect or sizable extra-axial collection is evident on this limited study. Patchy T2 hyperintensities in the cerebral white matter are incompletely evaluated and nonspecific but most commonly seen with chronic small vessel ischemia. A chronic lacunar infarct or dilated perivascular space is noted in the posterior right frontal white matter. Vascular: Not assessed on this limited study. Skull and upper cervical spine: No destructive  skull lesion. Sinuses/Orbits: Grossly clear paranasal sinuses. Limited assessment of the orbits. Other: None. IMPRESSION: 1. Incomplete, motion degraded examination. 2. No acute infarct. Electronically Signed   By: Sebastian Ache M.D.   On: 09/20/2023 15:19        Scheduled Meds:  Chlorhexidine Gluconate Cloth  6 each Topical Daily   feeding supplement  237 mL Oral BID BM   insulin aspart  0-9 Units Subcutaneous Q4H   insulin glargine-yfgn  10 Units Subcutaneous Daily   pantoprazole (PROTONIX) IV  40 mg Intravenous Q12H    sodium chloride flush  3 mL Intravenous Q12H   Continuous Infusions:  cefTRIAXone (ROCEPHIN)  IV Stopped (09/21/23 2206)     LOS: 2 days    Time spent: 55 minutes    Susane Bey D Sherryll Burger, DO [facility]  If 7PM-7AM, please contact night-coverage [email or web address] 09/22/2023, 11:09 AM

## 2023-09-22 NOTE — Consult Note (Signed)
Cardiology Consultation   Patient ID: ANGIE HOGG MRN: [id number]; DOB: Sep 21, 1950  Admit date: 09/19/2023 Date of Consult: 09/22/2023  PCP:  [name], DO   [facility] Providers Cardiologist:  Rollene Rotunda, MD        Patient Profile:   Tony Cantu is a 73 y.o. male with a hx of paroxysmal atrial fibrillation, aortic stenosis, HTN, HLD, Type 2 DM, gastric ulcers, PUD, cirrhosis with gastric varices and OSA (s/p Inspire device) who is being seen 09/22/2023 for the evaluation of severe AS, cardiac clearance for EGD and need for TEE in the setting of bacteremia at the request of Dr. Sherryll Burger.  History of Present Illness:   Mr. Vandeventer presented to [facility] ED on 09/19/2023 for evaluation of worsening weakness over the past 3 days Had been febrile to 101 F at home. Upon arrival, he was septic as he was febrile, tachycardic and tachypneic. Initial lactic acid was at 2.3 and downtrending to 1.8 on repeat check. Hs troponin values were mildly elevated at 28 and 35. CXR showed no acute findings. CT Head showed no acute intracranial abnormalities but was noted to have a tiny, remote lacunar infarct. MRI Brain showed no acute infarct. CT Chest showed liver cirrhosis, diverticular disease and perinephric stranding. He received IV fluids and was started on broad-spectrum antibiotics.  GI was consulted on admission given worsening anemia and melena. He had been on Eliquis and this was held in case he required procedures. Blood cultures resulted positive for strep bacteremia and ID is following. Echocardiogram this admission showed a preserved EF of 55 to 60% with no regional Winget motion abnormalities. Was noted to have grade 1 diastolic dysfunction, normal RV function, trivial MR and severe calcification of the aortic valve with trivial AI but severe aortic valve stenosis with mean gradient 46.3 mmHg.  In talking with the patient today, he reports activity at home is limited given  bilateral knee replacements. Says that he is mostly at home but does go to church. His wife does most of the grocery shopping. He denies any specific chest pain or dyspnea on exertion with ambulation. Says his most limiting factor is knee pain. No recent lightheadedness, dizziness or presyncope.  Reports that he still feels weak but this is starting to improve since admission. Says that he has been told he has a heart murmur for many decades and has previously discussed with Dr. Antoine Poche that he may require surgery for this in the future.   Past Medical History:  Diagnosis Date   Aortic atherosclerosis (HCC)    Aortic stenosis    Arthritis    Asthma    BPH (benign prostatic hypertrophy)    Cirrhosis (HCC) 2016   Colon polyps    Diabetes mellitus without complication (HCC)    TYPE 1    Diverticulosis    Dysrhythmia 04/19/2021   A-fib noted with possible A-fib RVR   Gastric ulcer    Gastric varices    right   Gastritis    GERD (gastroesophageal reflux disease)    Headache    History- pt states these were migraines that occurred in the 1980's   Heart murmur    History of kidney stones    Hx of adenomatous colonic polyps    Hypertension    Iron deficiency anemia due to chronic blood loss 08/16/2023   PONV (postoperative nausea and vomiting)    pt states he has never had post op nausea or vomiting  Portal hypertensive gastropathy (HCC)    Renal cyst, right    Seizures (HCC)    HYPOGLYCEMIC LAST 1 AND 1/2 YRS AGO   Sleep apnea    uses CPAP nightly   Testicle trouble    one testicle BORN WITH   Tubular adenoma of colon     Past Surgical History:  Procedure Laterality Date   BACK SURGERY     89  LOWER    CARPAL TUNNEL RELEASE Right 10/13/2015   Procedure: RIGHT CARPAL TUNNEL RELEASE;  Surgeon: Cindee Salt, MD;  Location: [facility];  Service: Orthopedics;  Laterality: Right;   CARPAL TUNNEL RELEASE Left 07/05/2016   Procedure: LEFT CARPAL TUNNEL RELEASE;   Surgeon: Cindee Salt, MD;  Location:  SURGERY CENTER;  Service: Orthopedics;  Laterality: Left;   COLONOSCOPY WITH ESOPHAGOGASTRODUODENOSCOPY (EGD)  10/05/2022   DRUG INDUCED ENDOSCOPY N/A 07/02/2021   Procedure: DRUG INDUCED SLEEP ENDOSCOPY;  Surgeon: Christia Reading, MD;  Location: [facility] OR;  Service: ENT;  Laterality: N/A;   IMPLANTATION OF HYPOGLOSSAL NERVE STIMULATOR Right 10/01/2021   Procedure: IMPLANTATION OF HYPOGLOSSAL NERVE STIMULATOR;  Surgeon: Christia Reading, MD;  Location: [facility] OR;  Service: ENT;  Laterality: Right;   INGUINAL HERNIA REPAIR  2003   right    KNEE ARTHROSCOPY Right 03/03/2016   LUMBAR DISC SURGERY  03/1995   Dr. Daleen Squibb, discectomy   LUMBAR LAMINECTOMY/DECOMPRESSION MICRODISCECTOMY Right 10/06/2016   Procedure: RIGHT LUMBAR THREE - LUMBAR FOUR  LAMINECTOMY, FORAMINOTOMY AND MICRODISCECTOMY;  Surgeon: Shirlean Kelly, MD;  Location: [facility] OR;  Service: Neurosurgery;  Laterality: Right;   SHOULDER SURGERY  11/28/2005   left partial   SHOULDER SURGERY  07/14/2006   RIGHT   TONSILLECTOMY  AGE 40 OR 4   TOTAL KNEE ARTHROPLASTY Right 03/07/2022   Procedure: RIGHT TOTAL KNEE ARTHROPLASTY;  Surgeon: Gean Birchwood, MD;  Location: [facility];  Service: Orthopedics;  Laterality: Right;   TOTAL KNEE ARTHROPLASTY Left 06/06/2022   Procedure: LEFT TOTAL KNEE ARTHROPLASTY;  Surgeon: Gean Birchwood, MD;  Location: [facility];  Service: Orthopedics;  Laterality: Left;   TOTAL SHOULDER ARTHROPLASTY Right 11/01/2018   Procedure: RIGHT SHOULDER REVISION TO REVERSE TOTAL SHOULDER;  Surgeon: Jones Broom, MD;  Location: [facility];  Service: Orthopedics;  Laterality: Right;  CHOICE ANESTHESIA WITH INTERSCALENE BLOCK EXPAREL, NEEDS RNFA     Home Medications:  Prior to Admission medications   Medication Sig Start Date End Date Taking? Authorizing Provider  acetaminophen (TYLENOL) 500 MG tablet Take 1,000 mg by mouth every 6 (six) hours as needed for moderate pain.   Yes [provider]   apixaban (ELIQUIS) 5 MG TABS tablet Take 1 tablet (5 mg total) by mouth 2 (two) times daily. 06/08/23  Yes Gottschalk, Kathie Rhodes M, DO  atorvastatin (LIPITOR) 40 MG tablet Take 1 tablet (40 mg total) by mouth daily. 06/08/23  Yes Delynn Flavin M, DO  b complex vitamins tablet Take 1 tablet by mouth daily.   Yes [provider]  Cholecalciferol (DIALYVITE VITAMIN D 5000) 125 MCG (5000 UT) capsule Take 5,000 Units by mouth daily.   Yes [provider]  Chromium Picolinate (CHROMIUM PICOLATE PO) Take 1 tablet by mouth daily.   Yes [provider]  CINNAMON PO Take 1,080 mg by mouth daily.   Yes [provider]  Coenzyme Q10 (COQ10) 100 MG CAPS Take 100 mg by mouth daily.   Yes [provider]  diltiazem (CARDIZEM) 30 MG tablet Take 1 tablet (30 mg total) by  mouth 2 (two) times daily. Patient must schedule annual office visit for further refills 06/26/23  Yes Rollene Rotunda, MD  ferrous sulfate 325 (65 FE) MG EC tablet Take 1 tablet (325 mg total) by mouth daily with breakfast. 01/18/23  Yes Pennington, Rebekah M, PA-C  furosemide (LASIX) 20 MG tablet Take 1.5 tablets (30 mg total) by mouth daily. 06/08/23  Yes Gottschalk, Ashly M, DO  Glucagon, rDNA, (GLUCAGON EMERGENCY) 1 MG KIT INJECT AS DIRECTED INTO  UPPER ARM, THIGH OR  BUTTOCKS AS NEEDED FOR  SEVERE HYPOGLYCEMIA. SEEK  MEDICAL ATTENTION AFTER USE Patient taking differently: Inject 1 mg into the muscle as needed (HYPOGLYCEMIA). 06/14/21  Yes Deliah Boston F, FNP  Glucosamine-Chondroit-Vit C-Mn (GLUCOSAMINE 1500 COMPLEX) CAPS Take 2 capsules by mouth daily.   Yes [provider]  insulin glargine, 2 Unit Dial, (TOUJEO MAX SOLOSTAR) 300 UNIT/ML Solostar Pen Inject 20-50 Units into the skin daily. 06/08/23  Yes Gottschalk, Ashly M, DO  insulin lispro (HUMALOG) 100 UNIT/ML injection Inject 0.2-0.3 mLs (20-30 Units total) into the skin 3 (three) times daily with meals. And per sliding scale.  **  dose change Patient taking differently: Inject 20-30 Units into the skin as needed for high blood sugar. And per sliding scale.  ** dose change Up to 6 to 8 times daily 06/08/23  Yes Delynn Flavin M, DO  Magnesium 250 MG TABS Take 250 mg by mouth daily.   Yes [provider]  montelukast (SINGULAIR) 10 MG tablet Take 1 tablet (10 mg total) by mouth at bedtime. 06/08/23  Yes Gottschalk, Kathie Rhodes M, DO  Multiple Vitamins-Minerals (EYE HEALTH) CAPS Take 1 capsule by mouth daily.   Yes [provider]  pantoprazole (PROTONIX) 40 MG tablet TAKE 1 TABLET BY MOUTH TWICE  DAILY BEFORE MEALS 06/14/23  Yes Pyrtle, Carie Caddy, MD  potassium chloride SA (KLOR-CON M) 20 MEQ tablet Take 1 tablet (20 mEq total) by mouth daily. 06/08/23  Yes Gottschalk, Ashly M, DO  ramelteon (ROZEREM) 8 MG tablet Take 1 tablet (8 mg total) by mouth at bedtime. 06/08/23  Yes Delynn Flavin M, DO  Testosterone 30 MG/ACT SOLN Apply 1 pump under each axilla once daily. 07/21/23  Yes Gottschalk, Kathie Rhodes M, DO  Turmeric Curcumin 500 MG CAPS Take 500 mg by mouth daily.   Yes [provider]  valsartan (DIOVAN) 40 MG tablet Take 1 tablet (40 mg total) by mouth daily. 06/08/23  Yes Gottschalk, Kathie Rhodes M, DO  Blood Glucose Monitoring Suppl (CONTOUR NEXT ONE) KIT Test BS QID and as needed Dx E11.9 06/04/20   Delynn Flavin M, DO  Continuous Blood Gluc Sensor (FREESTYLE LIBRE SENSOR SYSTEM) MISC Check BS eight (8) times a day. Dx E10.9 06/29/22   Delynn Flavin M, DO  glucose blood (CONTOUR NEXT TEST) test strip Test BS QID and as needed Dx E11.9 11/08/21   [name], DO  Microlet Lancets MISC Test BS QID and as needed Dx E11.9 06/04/20   [name], DO    Inpatient Medications: Scheduled Meds:  Chlorhexidine Gluconate Cloth  6 each Topical Daily   feeding supplement  237 mL Oral BID BM   insulin aspart  0-9 Units Subcutaneous Q4H   insulin glargine-yfgn  10 Units Subcutaneous Daily    pantoprazole (PROTONIX) IV  40 mg Intravenous Q12H   sodium chloride flush  3 mL Intravenous Q12H   Continuous Infusions:  cefTRIAXone (ROCEPHIN)  IV Stopped (09/21/23 2206)   PRN Meds: acetaminophen, albuterol  Allergies:  Allergies  Allergen Reactions   Dimetapp Children's Cold-Cough Other (See Comments)    Chest discomfort    Erythromycin Diarrhea   Sulfonamide Derivatives Diarrhea    Social History:   Social History   Socioeconomic History   Marital status: Married    Spouse name: Not on file   Number of children: 1   Years of education: Not on file   Highest education level: Not on file  Occupational History   Occupation: retired   Tobacco Use   Smoking status: Former    Current packs/day: 0.00    Types: Cigarettes, Pipe    Start date: 1971    Quit date: 2005    Years since quitting: 20.0   Smokeless tobacco: Never   Tobacco comments:    quit 2005 smoked cigarettes for 5 yrs prior to pipe use  Vaping Use   Vaping status: Never Used  Substance and Sexual Activity   Alcohol use: Not Currently   Drug use: No   Sexual activity: Yes  Other Topics Concern   Not on file  Social History Narrative   ** Merged History Encounter **    Right handed    Social Drivers of Health   Financial Resource Strain: Low Risk  (06/09/2022)   Overall Financial Resource Strain (CARDIA)    Difficulty of Paying Living Expenses: Not hard at all  Food Insecurity: Patient Unable To Answer (09/20/2023)   Hunger Vital Sign    Worried About Running Out of Food in the Last Year: Patient unable to answer    Ran Out of Food in the Last Year: Patient unable to answer  Transportation Needs: Patient Unable To Answer (09/20/2023)   PRAPARE - Transportation    Lack of Transportation (Medical): Patient unable to answer    Lack of Transportation (Non-Medical): Patient unable to answer  Physical Activity: Not on file  Stress: Not on file  Social Connections: Patient Unable To Answer  (09/20/2023)   Social Connection and Isolation Panel [NHANES]    Frequency of Communication with Friends and Family: Patient unable to answer    Frequency of Social Gatherings with Friends and Family: Patient unable to answer    Attends Religious Services: Patient unable to answer    Active Member of Clubs or Organizations: Patient unable to answer    Attends Banker Meetings: Patient unable to answer    Marital Status: Patient unable to answer  Intimate Partner Violence: Patient Unable To Answer (09/20/2023)   Humiliation, Afraid, Rape, and Kick questionnaire    Fear of Current or Ex-Partner: Patient unable to answer    Emotionally Abused: Patient unable to answer    Physically Abused: Patient unable to answer    Sexually Abused: Patient unable to answer    Family History:    Family History  Problem Relation Age of Onset   Heart failure Mother    Heart attack Mother        in her 16's   Alzheimer's disease Mother    Diabetes Mother    Asthma Father    Suicidality Father        in his 37's   Allergic rhinitis Sister    Heart failure Maternal Grandmother    Rheum arthritis Maternal Grandmother    Breast cancer Maternal Grandmother    Heart attack Maternal Grandmother        in her 82's   Colon cancer Neg Hx    Esophageal cancer Neg Hx    Pancreatic cancer  Neg Hx    Liver disease Neg Hx      ROS:  Please see the history of present illness.   All other ROS reviewed and negative.     Physical Exam/Data:   Vitals:   09/21/23 1502 09/21/23 2202 09/22/23 0449 09/22/23 0600  BP: (!) 105/59 121/73 98/65 (!) 113/57  Pulse: 77 84 70 (!) 50  Resp: 17 18 19 16   Temp: 98.3 F (36.8 C) 97.6 F (36.4 C) 97.6 F (36.4 C)   TempSrc: Oral Oral Oral   SpO2: 99% 98% 98% 96%  Weight:      Height:        Intake/Output Summary (Last 24 hours) at 09/22/2023 0744 Last data filed at 09/22/2023 1478 Gross per 24 hour  Intake 440 ml  Output 950 ml  Net -510 ml       09/20/2023    2:04 PM 09/19/2023    5:09 PM 09/13/2023   10:42 AM  Last 3 Weights  Weight (lbs) 203 lb 11.3 oz 209 lb 7 oz 209 lb 12.8 oz  Weight (kg) 92.4 kg 95 kg 95.165 kg     Body mass index is 30.97 kg/m.  General: Pleasant, elderly male appearing in no acute distress HEENT: normal Neck: no JVD Vascular: No carotid bruits; Distal pulses 2+ bilaterally Cardiac:  normal S1, S2; RRR; 3/6 systolic murmur along RUSB.  Lungs:  clear to auscultation bilaterally, no wheezing, rhonchi or rales  Abd: soft, nontender, no hepatomegaly  Ext: trace ankle edema bilaterally Musculoskeletal:  No deformities, BUE and BLE strength normal and equal Skin: warm and dry  Neuro:  CNs 2-12 intact, no focal abnormalities noted Psych:  Normal affect   EKG:  The EKG was personally reviewed and demonstrates:  NSR, HR 88 with known RBBB and PVC's. Telemetry:  Telemetry was personally reviewed and demonstrates:  NSR, HR in 60's with frequent PVC's and episodes of NSVT up to 4 beats.   Relevant CV Studies:  Echocardiogram: 08/2023 IMPRESSIONS     1. The aortic valve is calcified. Possibly tricuspid. There is severe  calcifcation of the aortic valve. There is severe thickening of the aortic  valve. Aortic valve regurgitation is trivial. Severe aortic valve  stenosis. Aortic valve area, by VTI  measures 0.89 cm. Aortic valve mean gradient measures 46.3 mmHg. Aortic  valve Vmax measures 4.55 m/s. DVI is 0.27.   2. Left ventricular ejection fraction, by estimation, is 55 to 60%. The  left ventricle has normal function. Left ventricular endocardial border  not optimally defined to evaluate regional Kinnard motion. The left  ventricular internal cavity size was mildly  to moderately dilated. Left ventricular diastolic parameters are  consistent with Grade I diastolic dysfunction (impaired relaxation).   3. Right ventricular systolic function is normal. The right ventricular  size is normal. Tricuspid  regurgitation signal is inadequate for assessing  PA pressure.   4. The mitral valve is abnormal. Trivial mitral valve regurgitation. Mild  mitral stenosis. The mean mitral valve gradient is 3.0 mmHg. Moderate  mitral annular calcification.   5. The inferior vena cava is normal in size with greater than 50%  respiratory variability, suggesting right atrial pressure of 3 mmHg.   Comparison(s): Changes from prior study are noted. Aortic valve stensois  progressed from moderate (in 2023) to severe now. V max is 4.55 m/s and  mean PG 46.3 mm Hg.   Laboratory Data:  High Sensitivity Troponin:   Recent Labs  Lab 09/19/23 1848  09/19/23 2045  TROPONINIHS 28* 35*     Chemistry Recent Labs  Lab 09/20/23 0258 09/21/23 0507 09/22/23 0512  NA 141 143 138  K 4.3 3.6 3.7  CL 111 115* 108  CO2 20* 23 23  GLUCOSE 266* 225* 262*  BUN 15 18 17   CREATININE 1.13 0.95 0.88  CALCIUM 8.8* 8.8* 8.5*  MG 1.6* 2.1 1.8  GFRNONAA >60 >60 >60  ANIONGAP 10 5 7     Recent Labs  Lab 09/20/23 0258 09/21/23 0507 09/22/23 0512  PROT 6.1* 5.6* 5.4*  ALBUMIN 2.7* 2.5* 2.4*  AST 65* 164* 143*  ALT 54* 80* 78*  ALKPHOS 80 70 66  BILITOT 1.6* 0.7 0.7   Lipids No results for input(s): "CHOL", "TRIG", "HDL", "LABVLDL", "LDLCALC", "CHOLHDL" in the last 168 hours.  Hematology Recent Labs  Lab 09/20/23 0258 09/21/23 0507 09/22/23 0512  WBC 13.4* 6.4 4.4  RBC 2.81* 2.75* 2.71*  HGB 10.2* 9.8* 9.5*  HCT 32.1* 30.9* 30.1*  MCV 114.2* 112.4* 111.1*  MCH 36.3* 35.6* 35.1*  MCHC 31.8 31.7 31.6  RDW 19.5* 18.8* 17.7*  PLT 105* 87* 81*   Thyroid No results for input(s): "TSH", "FREET4" in the last 168 hours.  BNPNo results for input(s): "BNP", "PROBNP" in the last 168 hours.  DDimer No results for input(s): "DDIMER" in the last 168 hours.   Radiology/Studies:  MR BRAIN WO CONTRAST Result Date: 09/20/2023 CLINICAL DATA:  Encephalopathy. Right-sided facial, arm, and leg weakness. Possible stroke.  EXAM: MRI HEAD WITHOUT CONTRAST TECHNIQUE: Multiplanar, multiecho pulse sequences of the brain and surrounding structures were obtained without intravenous contrast. COMPARISON:  Head CT 09/20/2023 FINDINGS: Despite multiple attempts, the examination had to be discontinued prior to completion as the patient attempted to remove the head coil. Axial and coronal diffusion weighted imaging and a sagittal T1 sequence were obtained and suffer from variable, up to severe motion artifact. Brain: A repeated axial DWI sequence is of reasonable quality with only mild motion artifact, and there is no evidence of an acute infarct. No significant intracranial mass effect or sizable extra-axial collection is evident on this limited study. Patchy T2 hyperintensities in the cerebral white matter are incompletely evaluated and nonspecific but most commonly seen with chronic small vessel ischemia. A chronic lacunar infarct or dilated perivascular space is noted in the posterior right frontal white matter. Vascular: Not assessed on this limited study. Skull and upper cervical spine: No destructive skull lesion. Sinuses/Orbits: Grossly clear paranasal sinuses. Limited assessment of the orbits. Other: None. IMPRESSION: 1. Incomplete, motion degraded examination. 2. No acute infarct. Electronically Signed   By: Sebastian Ache M.D.   On: 09/20/2023 15:19    CT HEAD WO CONTRAST ( ) Result Date: 09/20/2023 CLINICAL DATA:  Encephalopathy, right-sided weakness EXAM: CT HEAD WITHOUT CONTRAST TECHNIQUE: Contiguous axial images were obtained from the base of the skull through the vertex without intravenous contrast. RADIATION DOSE REDUCTION: This exam was performed according to the departmental dose-optimization program which includes automated exposure control, adjustment of the mA and/or kV according to patient size and/or use of iterative reconstruction technique. COMPARISON:  None Available. FINDINGS: Brain: Normal anatomic configuration  of the brain. Mild periventricular white matter changes are present likely the sequela small vessel ischemia. Tiny remote lacunar infarct noted within the right frontal subcortical white matter. No acute intracranial hemorrhage or infarct. No mass effect or midline shift no abnormal intra or extra-axial mass lesion. Ventricular size is normal. Cerebellum is unremarkable. Vascular: No hyperdense vessel or unexpected  calcification. Skull: Normal. Negative for fracture or focal lesion. Sinuses/Orbits: No acute finding. Other: Mastoid air cells and middle ear cavities are clear. IMPRESSION: 1. No acute intracranial abnormality. 2. Mild senescent change. 3. Tiny remote lacunar infarct within the right frontal subcortical white matter. Electronically Signed   By: Helyn Numbers M.D.   On: 09/20/2023 01:45   CT CHEST ABDOMEN PELVIS W CONTRAST Result Date: 09/19/2023 CLINICAL DATA:  Sepsis fever EXAM: CT CHEST, ABDOMEN, AND PELVIS WITH CONTRAST TECHNIQUE: Multidetector CT imaging of the chest, abdomen and pelvis was performed following the standard protocol during bolus administration of intravenous contrast. RADIATION DOSE REDUCTION: This exam was performed according to the departmental dose-optimization program which includes automated exposure control, adjustment of the mA and/or kV according to patient size and/or use of iterative reconstruction technique. CONTRAST:  OMNIPAQUE IOHEXOL 300 MG/ML  SOLN COMPARISON:  Chest x-ray 09/19/2023, CT 04/28/2021, 02/20/2019 FINDINGS: CT CHEST FINDINGS Cardiovascular: Mild aortic atherosclerosis. No aneurysm. Mitral annular calcification. Aortic valvular calcification. Borderline cardiomegaly. No pericardial effusion Mediastinum/Nodes: Patent trachea. No thyroid mass. No suspicious lymph nodes. Esophagus within normal limits Lungs/Pleura: No acute airspace disease, pleural effusion or pneumothorax. Musculoskeletal: Sternum appears intact. There are degenerative changes of  the spine. No acute osseous abnormality CT ABDOMEN PELVIS FINDINGS Hepatobiliary: Liver cirrhosis. No calcified gallstone or biliary dilatation Pancreas: Poorly visible, presumably due to marked atrophy. No inflammation. Spleen: Normal in size without focal abnormality. Adrenals/Urinary Tract: Adrenal glands are normal. Kidneys show no hydronephrosis. Renal cysts for which no imaging follow-up is recommended. Mild left greater than right perinephric stranding. Possible areas of cortical hypoenhancement involving the kidneys on delayed views but limited by artifact from the patient's arms. There is also motion degradation. The bladder is unremarkable Stomach/Bowel: The stomach is nonenlarged. There is no dilated small bowel. No acute bowel Cadena thickening. Diverticular disease of the colon. Vascular/Lymphatic: Mild aortic atherosclerosis. No aneurysm. No suspicious lymph nodes. Gastroesophageal varices. Collateral vessels in the left upper quadrant. Reproductive: Prostate is unremarkable. Other: Negative for pelvic effusion or free air. Mild skin thickening of the right anterior abdominal Stangler Musculoskeletal: Degenerative changes. No acute or suspicious osseous abnormality. IMPRESSION: 1. No CT evidence for acute intrathoracic abnormality. 2. Liver cirrhosis with evidence of portal hypertension including gastroesophageal varices. 3. Diverticular disease of the colon without acute inflammatory process. 4. Mild left greater than right perinephric stranding with possible areas of cortical hypoenhancement involving the kidneys on delayed views but limited by artifact from the patient's arms and motion degradation. Correlate with urinalysis to assess for UTI/pyelonephritis Aortic Atherosclerosis (ICD10-I70.0). Electronically Signed   By: Jasmine Pang M.D.   On: 09/19/2023 21:24   DG Chest Portable 1 View Result Date: 09/19/2023 CLINICAL DATA:  Generalized weakness and fever today. EXAM: PORTABLE CHEST 1 VIEW  COMPARISON:  03/10/2022 FINDINGS: The cardiac silhouette remains mildly enlarged. Decreased prominence of the pulmonary vasculature. Mildly prominent interstitial markings with an appearance compatible with chronic interstitial lung disease without superimposed pulmonary edema. No airspace consolidation or pleural fluid. Left shoulder prosthesis. Lower thoracic and upper lumbar spine degenerative changes. IMPRESSION: 1. No acute abnormality. 2. Mild cardiomegaly and mild chronic interstitial lung disease. Electronically Signed   By: Beckie Salts M.D.   On: 09/19/2023 18:24     Assessment and Plan:   1. Aortic Stenosis - Repeat echo this admission shows severe AS with mean gradient of 46.3 mmHg. He denies any recent dyspnea on exertion, chest pain, dizziness or presyncope. Given his asymptomatic state, could  obtain a repeat echo in 6 months for reassessment or refer to Structural Heart as an outpatient but would would need to wait until he has recovered from his acute illness.   2. Strep Bacteremia - ID is following and he remains on antibiotic therapy. TEE was requested and he agrees to proceed. After careful review of history and examination, the risks and benefits of transesophageal echocardiogram have been explained including risks of esophageal damage, perforation (1:10,000 risk), bleeding, pharyngeal hematoma as well as other potential complications associated with conscious sedation including aspiration, arrhythmia, respiratory failure and death. Alternatives to treatment were discussed, questions were answered. Patient is willing to proceed. - We have been in contact with GI as the plan is to try to perform this at the time of his EGD. As of right now, tentatively planning for 1430 pending MD assessment.   3. Paroxysmal Atrial Fibrillation - On short-acting Cardizem 30mg  BID at home but has been held given soft BP at times. BP is improving and at 113/57 on most recent check. He is on Eliquis at  home which has been held given his anemia and planned EGD.   4. PVC's/NSVT - He does have frequent PVC's and episodes of NSVT up to 4 beats on telemetry. Mg 1.8 this AM and K+ 3.7. Will order supplementation to keep Mg ~ 2.0 and K+ ~ 4.0.  5. Metabolic Encephalopathy - CT Head and Brain MRI showed no acute intracranial abnormalities. He is A&Ox3 at the time of this encounter.   6. OSA - Followed by Pulmonology and he underwent Inspire device placement in 09/2021.  7. Thrombocytopenia - Platelet count at 81 K today. Likely due to known cirrhosis and current sepsis.   8. Anemia/Melena - GI following and planning for EGD today at the time of his TEE. While his activity is limited due to knee pain, he denies any recent anginal symptoms. Will review with MD but anticipate he could proceed with EGD without further cardiac testing.  For questions or updates, please contact [facility] Please consult [email or web address] for contact info under    Signed, Ellsworth Lennox, PA-C  09/22/2023 7:44 AM

## 2023-09-22 NOTE — Progress Notes (Signed)
Speech Language Pathology Treatment: Dysphagia  Patient Details Name: Tony Cantu MRN: [id number] DOB: 06-24-1951 Today's Date: 09/22/2023 Time: 3086-5784 SLP Time Calculation (min) (ACUTE ONLY): 14 min  Assessment / Plan / Recommendation Clinical Impression  Pt provided ongoing diagnostic dysphagia therapy. Pt consumed thin liquids and regular textures without overt s/sx of oropharyngeal dysphagia. Recommend continue with regular/thin diet and meds are ok whole with liquids. There are no further ST needs indicated at this time, our service will sign off. Thank you for this referral,    HPI HPI: Tony Cantu is a 73 y.o. male with hx of type 1 diabetes, A-fib on Eliquis, cirrhosis, seizure disorder, aortic stenosis, hypertension, peptic ulcer disease, OA with bilateral knee replacement, polyclonal gammopathy, OSA on CPAP, who was brought in from home due to progressive encephalopathy.  He was admitted for sepsis, POA, with unknown source, but is now noted to have gram-positive bacteremia.  He is also noted to have associated acute metabolic encephalopathy. BSE requested      SLP Plan  All goals met      Recommendations for follow up therapy are one component of a multi-disciplinary discharge planning process, led by the attending physician.  Recommendations may be updated based on patient status, additional functional criteria and insurance authorization.    Recommendations  Diet recommendations: Regular;Thin liquid Liquids provided via: Cup;Straw Medication Administration: Whole meds with liquid Supervision: Patient able to self feed Compensations: Slow rate;Small sips/bites                  Oral care BID;Staff/trained caregiver to provide oral care     Dysphagia, unspecified (R13.10)     All goals met   Tony Cantu H. Tony Cantu, CCC-SLP Speech Language Pathologist   Tony Cantu  09/22/2023, 11:45 AM

## 2023-09-22 NOTE — Care Management Important Message (Signed)
Important Message  Patient Details  Name: Tony Cantu MRN: [id number] Date of Birth: 1951-01-24   Important Message Given:  Yes - Medicare IM     Corey Harold 09/22/2023, 2:34 PM

## 2023-09-22 NOTE — Consult Note (Addendum)
Virtual Visit via Telephone/Video Note   I connected with Tony Cantu   On 09/22/2023 at 8:47 AM  by Video and verified that I am speaking with the correct person using two identifiers.   I discussed the limitations, risks, security and privacy concerns of performing an evaluation and management service by video and the availability of in person appointments.  Location:   Patient: [facility] Provider: Lucien Mons  Date of Admission:  09/19/2023   Total days of inpatient antibiotics 3        Reason for Consult:   Strep vbacteremia  Principal Problem:   Sepsis (HCC) Active Problems:   Stroke-like symptom   Acute encephalopathy   Transaminitis   Renal lesion   Melena   Streptococcal bacteremia   Assessment: 73 year old male with a complicated medical history including A-fib on Eliquis, diabetes type 1, cirrhosis, seizure disorder, aortic stenosis admitted with sepsis secondary to concern for septic emboli. 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  He was labral on presentation, WBC 13K.  CT head negative.  CT chest abdomen pelvis showed perinephric standing, possible hypoenhancement in kidneys.  MRI was motion degraded although no acute abnormalities. - Right-sided weakness noticed on exam per primary. - Blood cultures from admission 2/2 grew strep Species.  Patient has no other complaints including shortness of breath prior to hospitalization, rashes. -TTE showed severe aortic valve thickening   Recommendations:  -Repeat blood Cx -TEE -coninue ceftriaxone -follow sensitivities Microbiology:   Antibiotics:   Cultures: Blood 1/21 2/2 streptococcus species Urine  Other   HPI: RUMALDO DIFATTA is a 73 y.o. male with history of type 1 diabetes, A-fib on Eliquis, cirrhosis, seizure disorder, aortic stenosis, hypertension, peripheral cell disease,  OA with bilateral knee replacement, polyclonal gammopathy, OSA on CPAP brought from home due to progressive encephalopathy last known well was 1/20 on Monday.  On arrival patient had temp of 103.1 WBC 13.4 K.Checks x-ray showed no acute abnormality.  CT chest abdomen pelvis showed mild left greater than right perinephric stranding, possible hypoenhancement involving kidneys.  Liver cirrhosis with evidence of portal hypertension.  CT head showed no acute abnormality.  MRI brain showed no acute infarct, although it was incomplete due to motion degraded exam.TTE showed calcified aortic valve, there is severe thickening of valve. - Blood cultures grew Streptococcus species.  ID engaged to concern for endocarditis   Review of Systems: ROS  Past Medical History:  Diagnosis Date   Aortic atherosclerosis (HCC)    Aortic stenosis    Arthritis    Asthma    BPH (benign prostatic hypertrophy)    Cirrhosis (HCC) 2016   Colon polyps    Diabetes mellitus without complication (HCC)    TYPE 1    Diverticulosis    Dysrhythmia 04/19/2021   A-fib noted with possible A-fib RVR   Gastric ulcer    Gastric varices    right   Gastritis    GERD (gastroesophageal reflux disease)    Headache    History- pt states these were migraines that occurred in the 1980's   Heart murmur    History of kidney stones    Hx of adenomatous colonic polyps    Hypertension    Iron deficiency anemia due to chronic blood loss 08/16/2023   PONV (postoperative nausea and vomiting)    pt states he has never had post op nausea  or vomiting   Portal hypertensive gastropathy (HCC)    Renal cyst, right    Seizures (HCC)    HYPOGLYCEMIC LAST 1 AND 1/2 YRS AGO   Sleep apnea    uses CPAP nightly   Testicle trouble    one testicle BORN WITH   Tubular adenoma of colon     Social History   Tobacco Use   Smoking status: Former    Current packs/day: 0.00    Types: Cigarettes, Pipe    Start date: 39    Quit date: 2005     Years since quitting: 20.0   Smokeless tobacco: Never   Tobacco comments:    quit 2005 smoked cigarettes for 5 yrs prior to pipe use  Vaping Use   Vaping status: Never Used  Substance Use Topics   Alcohol use: Not Currently   Drug use: No    Family History  Problem Relation Age of Onset   Heart failure Mother    Heart attack Mother        in her 64's   Alzheimer's disease Mother    Diabetes Mother    Asthma Father    Suicidality Father        in his 61's   Allergic rhinitis Sister    Heart failure Maternal Grandmother    Rheum arthritis Maternal Grandmother    Breast cancer Maternal Grandmother    Heart attack Maternal Grandmother        in her 26's   Colon cancer Neg Hx    Esophageal cancer Neg Hx    Pancreatic cancer Neg Hx    Liver disease Neg Hx    Scheduled Meds:  Chlorhexidine Gluconate Cloth  6 each Topical Daily   feeding supplement  237 mL Oral BID BM   insulin aspart  0-9 Units Subcutaneous Q4H   insulin glargine-yfgn  10 Units Subcutaneous Daily   pantoprazole (PROTONIX) IV  40 mg Intravenous Q12H   sodium chloride flush  3 mL Intravenous Q12H   Continuous Infusions:  cefTRIAXone (ROCEPHIN)  IV Stopped (09/21/23 2206)   PRN Meds:.acetaminophen, albuterol Allergies  Allergen Reactions   Dimetapp Children's Cold-Cough Other (See Comments)    Chest discomfort    Erythromycin Diarrhea   Sulfonamide Derivatives Diarrhea    OBJECTIVE: Blood pressure (!) 113/57, pulse (!) 50, temperature 97.6 F (36.4 C), temperature source Oral, resp. rate 16, height 5\' 8"  (1.727 m), weight 92.4 kg, SpO2 96%.   Lab Results Lab Results  Component Value Date   WBC 4.4 09/22/2023   HGB 9.5 (L) 09/22/2023   HCT 30.1 (L) 09/22/2023   MCV 111.1 (H) 09/22/2023   PLT 81 (L) 09/22/2023    Lab Results  Component Value Date   CREATININE 0.88 09/22/2023   BUN 17 09/22/2023   NA 138 09/22/2023   K 3.7 09/22/2023   CL 108 09/22/2023   CO2 23 09/22/2023    Lab Results   Component Value Date   ALT 78 (H) 09/22/2023   AST 143 (H) 09/22/2023   ALKPHOS 66 09/22/2023   BILITOT 0.7 09/22/2023       Danelle Earthly, MD [facility] 09/22/2023, 8:47 AM  I have personally spent 82 minutes involved in face-to-face and non-face-to-face activities for this patient on the day of the visit.

## 2023-09-22 NOTE — TOC Initial Note (Signed)
Transition of Care [facility]) - Initial/Assessment Note    Patient Details  Name: Tony Cantu MRN: [id number] Date of Birth: 1951/08/22  Transition of Care [facility]) CM/SW Contact:    Elliot Gault, LCSW Phone Number: 09/22/2023, 11:46 AM  Clinical Narrative:                  TOC following. PT evaluated pt yesterday and recommended SNF rehab at dc. Spoke with pt and his wife at bedside to review PT recommendation and they stated that new plan is for pt to transfer to [facility] for his procedures and they prefer to wait until he is more stable to decide on dc plan.  [facility]OC at [facility] will follow.   Expected Discharge Plan: Skilled Nursing Facility Barriers to Discharge: Continued Medical Work up   Patient Goals and CMS Choice Patient states their goals for this hospitalization and ongoing recovery are:: get better          Expected Discharge Plan and Services In-house Referral: Clinical Social Work     Living arrangements for the past 2 months: Single Family Home                                      Prior Living Arrangements/Services Living arrangements for the past 2 months: Single Family Home Lives with:: Spouse Patient language and need for interpreter reviewed:: Yes Do you feel safe going back to the place where you live?: Yes      Need for Family Participation in Patient Care: No (Comment)     Criminal Activity/Legal Involvement Pertinent to Current Situation/Hospitalization: No - Comment as needed  Activities of Daily Living   ADL Screening (condition at time of admission) Independently performs ADLs?: Yes (appropriate for developmental age) Is the patient deaf or have difficulty hearing?: No Does the patient have difficulty seeing, even when wearing glasses/contacts?: No Does the patient have difficulty concentrating, remembering, or making decisions?: No  Permission Sought/Granted                  Emotional Assessment Appearance:: Appears stated  age Attitude/Demeanor/Rapport: Engaged Affect (typically observed): Accepting Orientation: : Oriented to Self, Oriented to Place, Oriented to  Time, Oriented to Situation Alcohol / Substance Use: Not Applicable Psych Involvement: No (comment)  Admission diagnosis:  Sepsis (HCC) [A41.9] Fever, unspecified fever cause [R50.9] Patient Active Problem List   Diagnosis Date Noted   Streptococcal bacteremia 09/21/2023   Sepsis (HCC) 09/20/2023   Stroke-like symptom 09/20/2023   Acute encephalopathy 09/20/2023   Transaminitis 09/20/2023   Renal lesion 09/20/2023   Melena 09/20/2023   Iron deficiency anemia due to chronic blood loss 08/16/2023   NAFLD (nonalcoholic fatty liver disease) 08/31/7251   Portal hypertension (HCC) 08/24/2022   S/P total knee arthroplasty, left 06/06/2022   Degenerative arthritis of left knee 06/03/2022   Chronic obstructive pulmonary disease (HCC) 05/10/2022   Grade I diastolic dysfunction 03/11/2022   Postoperative fever 03/10/2022   S/P TKR (total knee replacement), right 03/07/2022   Osteoarthritis of right knee 03/04/2022   Precordial chest pain 04/20/2021   Former smoker 01/12/2021   PAF (paroxysmal atrial fibrillation) (HCC) 06/22/2020   Hypertension associated with type 2 diabetes mellitus (HCC) 05/17/2019   Hyperlipidemia associated with type 2 diabetes mellitus (HCC) 05/17/2019   GERD with esophagitis 05/17/2019   H/O total shoulder replacement, right 11/01/2018   Paroxysmal tachycardia (HCC) 01/11/2017   Nonrheumatic  aortic valve stenosis 01/11/2017   Aortic atherosclerosis (HCC) 11/23/2016   HNP (herniated nucleus pulposus), lumbar 10/06/2016   Acute renal injury (HCC) 08/06/2016   Diarrhea 08/06/2016   Fever 08/06/2016   Hyperbilirubinemia 08/06/2016   Lactic acidosis 08/06/2016   Inguinal hernia 12/10/2015   Right groin pain 11/30/2015   Carpal tunnel syndrome on right 09/09/2015   Cervical spondylosis without myelopathy 09/09/2015    Thrombocytopenia (HCC) 07/21/2014   Bilateral carotid bruits 05/11/2014   Vitamin D deficiency 09/17/2013   BPH (benign prostatic hyperplasia) 05/22/2013   Low serum testosterone level 02/18/2013   Diabetes type 2, controlled (HCC) 01/10/2013   OSA (obstructive sleep apnea) 05/16/2012   Edema 02/21/2012   At risk for coronary artery disease 03/20/2011   Obesity 03/20/2011   Allergic rhinitis 06/22/2010   ASTHMA 06/22/2010   COUGH 06/22/2010   PCP:  [name], DO Pharmacy:   [facility] [place], [place] - [address] [place] [address] Phone: [phone] Fax: [phone]  [facility] - [place], [place] - [address] [place] [address] Phone: [phone] Fax: [phone]  [facility] - [place], [place] - [address] [employer] [address] [place] [address] Phone: [phone] Fax: [phone]     Social Drivers of Health (SDOH) Social History: SDOH Screenings   Food Insecurity: Patient Unable To Answer (09/20/2023)  Housing: Low Risk  (09/22/2023)  Transportation Needs: Patient Unable To Answer (09/20/2023)  Utilities: Patient Unable To Answer (09/20/2023)  Depression (PHQ2-9): Medium Risk (09/13/2023)  Financial Resource Strain: Low Risk  (06/09/2022)  Social Connections: Patient Unable To Answer (09/20/2023)  Tobacco Use: Medium Risk (09/19/2023)   SDOH Interventions:     Readmission Risk Interventions     No data to display

## 2023-09-22 NOTE — Progress Notes (Addendum)
Patient initially consented for EGD at same time as TEE for today at [facility] however after further conversations with cardiology and anesthesia it was recommended that he be transferred to [facility] for further cardiac evaluation given severe AS.   GI at [facility], Amy Esterwood, PA-C notified of patients pending transfer and need for EGD for evaluation of melena and anemia. Per patient he is a patient of Dr. Rhea Belton with [facility] and is understanding of the reason for transfer. He was tolerating small bites of soup/veggies diet during my visit. Speech followed up with him briefly this morning and no significant dysphagia present.   Brooke Bonito, MSN, APRN, FNP-BC, AGACNP-BC [facility] at [facility]

## 2023-09-22 NOTE — Progress Notes (Addendum)
ID Brief Note:  73 year old male with a complicated medical history including A-fib on Eliquis, diabetes type 1, cirrhosis, seizure disorder, aortic stenosis admitted with sepsis secondary to concern for septic emboli. 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  -GI engaged Given melena in the setting of PUD, Gi engaged and noted need for EGD. -given TEE and EGD recommend pt plan to transfer to [facility] for higher level of care.  -I suspect bacteremia 2/2 GI translocation

## 2023-09-23 ENCOUNTER — Encounter (HOSPITAL_COMMUNITY): Payer: Self-pay | Admitting: Internal Medicine

## 2023-09-23 DIAGNOSIS — A419 Sepsis, unspecified organism: Secondary | ICD-10-CM | POA: Diagnosis not present

## 2023-09-23 DIAGNOSIS — G9341 Metabolic encephalopathy: Secondary | ICD-10-CM | POA: Diagnosis not present

## 2023-09-23 DIAGNOSIS — R6520 Severe sepsis without septic shock: Secondary | ICD-10-CM | POA: Diagnosis not present

## 2023-09-23 LAB — COMPREHENSIVE METABOLIC PANEL
ALT: 82 U/L — ABNORMAL HIGH (ref 0–44)
AST: 128 U/L — ABNORMAL HIGH (ref 15–41)
Albumin: 2.3 g/dL — ABNORMAL LOW (ref 3.5–5.0)
Alkaline Phosphatase: 73 U/L (ref 38–126)
Anion gap: 6 (ref 5–15)
BUN: 17 mg/dL (ref 8–23)
CO2: 22 mmol/L (ref 22–32)
Calcium: 8.3 mg/dL — ABNORMAL LOW (ref 8.9–10.3)
Chloride: 108 mmol/L (ref 98–111)
Creatinine, Ser: 0.77 mg/dL (ref 0.61–1.24)
GFR, Estimated: >60 mL/min (ref >60)
Glucose, Bld: 235 mg/dL — ABNORMAL HIGH (ref 70–99)
Potassium: 3.6 mmol/L (ref 3.5–5.1)
Sodium: 136 mmol/L (ref 135–145)
Total Bilirubin: 0.5 mg/dL (ref 0.0–1.2)
Total Protein: 5.2 g/dL — ABNORMAL LOW (ref 6.5–8.1)

## 2023-09-23 LAB — GLUCOSE, CAPILLARY
Glucose-Capillary: 215 mg/dL — ABNORMAL HIGH (ref 70–99)
Glucose-Capillary: 230 mg/dL — ABNORMAL HIGH (ref 70–99)
Glucose-Capillary: 246 mg/dL — ABNORMAL HIGH (ref 70–99)
Glucose-Capillary: 271 mg/dL — ABNORMAL HIGH (ref 70–99)
Glucose-Capillary: 364 mg/dL — ABNORMAL HIGH (ref 70–99)
Glucose-Capillary: 405 mg/dL — ABNORMAL HIGH (ref 70–99)

## 2023-09-23 LAB — CBC
HCT: 29.2 % — ABNORMAL LOW (ref 39.0–52.0)
Hemoglobin: 9.5 g/dL — ABNORMAL LOW (ref 13.0–17.0)
MCH: 35.4 pg — ABNORMAL HIGH (ref 26.0–34.0)
MCHC: 32.5 g/dL (ref 30.0–36.0)
MCV: 109 fL — ABNORMAL HIGH (ref 80.0–100.0)
Platelets: 87 10*3/uL — ABNORMAL LOW (ref 150–400)
RBC: 2.68 MIL/uL — ABNORMAL LOW (ref 4.22–5.81)
RDW: 16.6 % — ABNORMAL HIGH (ref 11.5–15.5)
WBC: 4.1 10*3/uL (ref 4.0–10.5)
nRBC: 0 % (ref 0.0–0.2)

## 2023-09-23 LAB — MAGNESIUM: Magnesium: 1.9 mg/dL (ref 1.7–2.4)

## 2023-09-23 MED ORDER — SODIUM CHLORIDE 0.9% FLUSH
3.0000 mL | Freq: Two times a day (BID) | INTRAVENOUS | Status: DC
  Administered 2023-09-23 – 2023-09-24 (×2): 10 mL via INTRAVENOUS
  Administered 2023-09-24 – 2023-09-25 (×2): 3 mL via INTRAVENOUS

## 2023-09-23 MED ORDER — SODIUM CHLORIDE 0.9% FLUSH: 3.0000 mL | INTRAVENOUS | Status: DC | PRN

## 2023-09-23 NOTE — Plan of Care (Signed)
Will continue to monitor patient.

## 2023-09-23 NOTE — Progress Notes (Signed)
[facility] contacted for transport.  Pt's ex-wife states pt's Inspire control (implanted C-Pap device), his cell-phone, charger, glasses and case are all at bedside for transport. Items bagged and labeled.

## 2023-09-23 NOTE — Progress Notes (Signed)
PROGRESS NOTE    Tony EGGEBRECHT  YNW:[id number] DOB: 1950/12/24 DOA: 09/19/2023 PCP: [name], DO   Brief Narrative:    Tony Cantu is a 73 y.o. male with hx of type 1 diabetes, A-fib on Eliquis, cirrhosis, seizure disorder, aortic stenosis, hypertension, peptic ulcer disease, OA with bilateral knee replacement, polyclonal gammopathy, OSA on CPAP, who was brought in from home due to progressive encephalopathy.  He was admitted for sepsis, POA, with noted strep bacteremia.  He is also noted to have associated acute metabolic encephalopathy-which has now resolved.  Brain MRI with no acute findings and 2D echocardiogram with no findings of endocarditis.  ID consulted to assist with management due to noted strep bacteremia.  Patient seen by GI and cardiology and plan was to initially try to perform EGD and TEE around the same time, but unfortunately due to severe aortic stenosis, he has not a candidate for sedation at [facility] and therefore will be transferred to [facility] to be followed by GI, cardiology, and ID for further evaluation and management.  He is currently awaiting transfer to [facility].  Assessment & Plan:   Principal Problem:   Sepsis (HCC) Active Problems:   Stroke-like symptom   Acute encephalopathy   Transaminitis   Renal lesion   Melena   Streptococcal bacteremia  Assessment and Plan:   Sepsis, POA secondary to strep bacteremia -Continue on Rocephin for strep bacteremia with final ID and sensitivities pending -TTE with no findings of vegetations -Appreciate ID recommendations -Repeat blood cultures negative thus far -Plans for TEE on Monday per cardiology at [facility] and could not have sedation at [facility] due to severe aortic stenosis   Right-sided weakness -Brain MRI negative -PT/OT recommending SNF on discharge   Progressive encephalopathy-resolved -Brain MRI negative  Melena/worsening anemia -Continue PPI twice daily -Continue to hold  anticoagulation with last dose of Eliquis Monday night -Appreciate GI evaluation at [facility] for EGD.  Patient was too high risk for procedure at [facility] given severe aortic stenosis.   Question of hypodense lesions in kidneys - Consider renal infarct, Spoke with Radiologist on call who reviewed imaging and feels that renal arteries are well opacified to the level of renal sinus. The hypodense lesions are not well demarcated wedge typical of classic renal infarcts. So at this point indeterminate lesions. Will hold off on CTA considering renal arteries with no large embolism / dissection etc to explain.    Severe aortic valve stenosis - Preserved LVEF and mild Midgley motion abnormality changes noted on 2D echocardiogram -Further cardiology evaluation pending with TEE at [facility].  Would likely require outpatient structural cardiology referral.   Acute liver injury, hepatocellular pattern Minimal elevation AST 64, ALT 61.  CT without acute hepatobiliary pathology.  Suspect this is secondary to underlying sepsis - Trend LFT   Chronic medical problems: Type 1 diabetes: Note UA positive ketone, no anion gap.  Blood gas, beta hydroxybutyrate pending.  Takes a variable amount of insulin at home, recent PCP note from 1/10 noting decrease in glargine to 40 units daily, also takes sliding scale.  Reduce dose Semglee here 30 units daily, aspart 5 units 3 times daily with meals scheduled, sliding scale for moderate.  A-fib: Holding home Eliquis pending stroke evaluation, hold diltiazem in setting of sepsis/borderline pressures History of cirrhosis: Check ammonia to rule out HE. Trending liver enzymes. Previously compensated. Hx Portal HTN gastropathy / GAVE, no varices on last EGD. No ascites.  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   Body mass index is 31.84 kg/m.  Obesity class I, benefit from continued weight loss.    DVT prophylaxis:SCDs, Eliquis held for now Code Status: Full Family Communication: Wife on phone 1/24 Disposition Plan:  Status is: Inpatient Remains inpatient appropriate because: Need for IV medications.  Consultants:  GI ID  Procedures:  None  Antimicrobials:  Anti-infectives (From admission, onward)    Start     Dose/Rate Route Frequency Ordered Stop   09/20/23 2200  vancomycin (VANCOREADY) IVPB 1500 mg/300 mL  Status:  Discontinued        1,500 mg 150 mL/hr over 120 Minutes Intravenous Every 24 hours 09/20/23 1020 09/20/23 1529   09/20/23 2200  cefTRIAXone (ROCEPHIN) 2 g in sodium chloride 0.9 % 100 mL IVPB        2 g 200 mL/hr over 30 Minutes Intravenous Every 24 hours 09/20/23 1529     09/20/23 0600  ceFEPIme (MAXIPIME) 2 g in sodium chloride 0.9 % 100 mL IVPB  Status:  Discontinued        2 g 200 mL/hr over 30 Minutes Intravenous Every 8 hours 09/20/23 0038 09/20/23 1529   09/20/23 0045  metroNIDAZOLE (FLAGYL) IVPB 500 mg  Status:  Discontinued        500 mg 100 mL/hr over 60 Minutes Intravenous Every 12 hours 09/20/23 0033 09/20/23 1529   09/19/23 2200  vancomycin (VANCOREADY) IVPB 2000 mg/400 mL        2,000 mg 200 mL/hr over 120 Minutes Intravenous  Once 09/19/23 2146 09/20/23 0103   09/19/23 2145  ceFEPIme (MAXIPIME) 2 g in sodium chloride 0.9 % 100 mL IVPB        2 g 200 mL/hr over 30 Minutes Intravenous  Once 09/19/23 2143 09/19/23 2247      Subjective: Patient seen and evaluated today with no acute events or concerns noted overnight.  He appears to overall be doing well and mentation  has improved.  He denies any pain complaints.  Currently awaiting transfer to [facility].  Objective: Vitals:   09/22/23 1200 09/22/23 2020 09/22/23 2023 09/23/23 0607  BP: 116/68  108/61 115/72  Pulse: 63  67 70  Resp: 19  (!) 21 20  Temp:  97.9 F (36.6 C)  98.1 F (36.7 C)  TempSrc:  Oral  Oral  SpO2: 97%  97% 95%  Weight:      Height:        Intake/Output Summary (Last 24 hours) at 09/23/2023 1219 Last data filed at 09/23/2023 4098 Gross per 24 hour  Intake 50 ml  Output 2200 ml  Net -2150 ml   Filed Weights   09/19/23 1709 09/20/23 1404  Weight: 95 kg 92.4 kg    Examination:  General exam: Somnolent but arousable Respiratory system: Clear to auscultation. Respiratory effort normal. Cardiovascular system: S1 & S2 heard, RRR.  Gastrointestinal system: Abdomen is soft Central nervous system: Somnolent but arousable Extremities: No edema Skin: No significant lesions noted Psychiatry: Flat affect.    Data Reviewed: I have personally reviewed following labs and imaging studies  CBC:  Recent Labs  Lab 09/19/23 1848 09/20/23 0258 09/21/23 0507 09/22/23 0512 09/23/23 0427  WBC 8.9 13.4* 6.4 4.4 4.1  NEUTROABS 7.8*  --   --   --   --   HGB 10.2* 10.2* 9.8* 9.5* 9.5*  HCT 31.4* 32.1* 30.9* 30.1* 29.2*  MCV 111.7* 114.2* 112.4* 111.1* 109.0*  PLT 126* 105* 87* 81* 87*   Basic Metabolic Panel: Recent Labs  Lab 09/19/23 1848 09/20/23 0258 09/21/23 0507 09/22/23 0512 09/23/23 0427  NA 140 141 143 138 136  K 3.9 4.3 3.6 3.7 3.6  CL 109 111 115* 108 108  CO2 20* 20* 23 23 22   GLUCOSE 161* 266* 225* 262* 235*  BUN 14 15 18 17 17   CREATININE 0.98 1.13 0.95 0.88 0.77  CALCIUM 9.2 8.8* 8.8* 8.5* 8.3*  MG  --  1.6* 2.1 1.8 1.9  PHOS  --  3.2  --   --   --    GFR: Estimated Creatinine Clearance: 90.7 mL/min (by C-G formula based on SCr of 0.77 mg/dL). Liver Function Tests: Recent Labs  Lab 09/19/23 1848 09/20/23 0258 09/21/23 0507 09/22/23 0512 09/23/23 0427   AST 64* 65* 164* 143* 128*  ALT 61* 54* 80* 78* 82*  ALKPHOS 104 80 70 66 73  BILITOT 1.2 1.6* 0.7 0.7 0.5  PROT 6.6 6.1* 5.6* 5.4* 5.2*  ALBUMIN 3.1* 2.7* 2.5* 2.4* 2.3*   No results for input(s): "LIPASE", "AMYLASE" in the last 168 hours. Recent Labs  Lab 09/20/23 0532  AMMONIA 13   Coagulation Profile: Recent Labs  Lab 09/20/23 1321 09/22/23 1250  INR 1.4* 1.1   Cardiac Enzymes: No results for input(s): "CKTOTAL", "CKMB", "CKMBINDEX", "TROPONINI" in the last 168 hours. BNP (last 3 results) No results for input(s): "PROBNP" in the last 8760 hours. HbA1C: No results for input(s): "HGBA1C" in the last 72 hours.  CBG: Recent Labs  Lab 09/22/23 2008 09/23/23 0030 09/23/23 0409 09/23/23 0734 09/23/23 1118  GLUCAP 316* 271* 230* 215* 246*   Lipid Profile: No results for input(s): "CHOL", "HDL", "LDLCALC", "TRIG", "CHOLHDL", "LDLDIRECT" in the last 72 hours. Thyroid Function Tests: No results for input(s): "TSH", "T4TOTAL", "FREET4", "T3FREE", "THYROIDAB" in the last 72 hours. Anemia Panel: No results for input(s): "VITAMINB12", "FOLATE", "FERRITIN", "TIBC", "IRON", "RETICCTPCT" in the last 72 hours. Sepsis Labs: Recent Labs  Lab 09/19/23 1848 09/19/23 2045  LATICACIDVEN 2.3* 1.8    Recent Results (from the past 240 hours)  Blood culture (routine x 2)     Status: Abnormal   Collection Time: 09/19/23  6:48 PM   Specimen: BLOOD LEFT HAND  Result Value Ref Range Status   Specimen Description   Final    BLOOD LEFT HAND Performed at [facility], [address] [place], [place] [address]    Special Requests   Final    BOTTLES DRAWN AEROBIC ONLY Blood Culture results may not be optimal due to an inadequate volume of blood received in culture bottles Performed at [facility], [address] [place], [place] [address]    Culture  Setup Time   Final    GRAM POSITIVE COCCI AEROBIC BOTTLE ONLY Gram Stain Report Called to,Read Back By and Verified With:  KRISTY EDWARDS AT 0859 09/20/2023 BY A. SNYDER GRAM STAIN REVIEWED-AGREE WITH RESULT DRT    Culture (A)  Final    STREPTOCOCCUS SALIVARIUS SUSCEPTIBILITIES PERFORMED ON PREVIOUS CULTURE WITHIN THE LAST 5 DAYS. Performed at [facility], [address], [place], [place]  [address]    Report Status 09/22/2023 FINAL  Final  Blood culture (routine x 2)     Status: Abnormal   Collection Time: 09/19/23  6:48 PM   Specimen: BLOOD LEFT HAND  Result Value Ref Range Status   Specimen Description   Final    BLOOD LEFT HAND Performed at [facility], [address], [place], [place] [address]    Special Requests   Final    BOTTLES DRAWN AEROBIC ONLY Blood Culture results may not be optimal due to an inadequate volume of blood received in culture bottles Performed at [facility], [address], [place], [place] [address]    Culture  Setup Time   Final    GRAM POSITIVE COCCI AEROBIC BOTTLE ONLY Gram Stain Report Called to,Read Back By and Verified With: KRISTY EDWARDS AT 0859 09/20/2023 BY A. SNYDER GRAM STAIN REVIEWED-AGREE WITH RESULT DRT CRITICAL RESULT CALLED TO, READ BACK BY AND VERIFIED WITH: PHARMD F.WILSON [id number] @ 1518 FH Performed at [facility], [address] [place], [place] [address]    Culture STREPTOCOCCUS SALIVARIUS (A)  Final   Report Status 09/22/2023 FINAL  Final   Organism ID, Bacteria STREPTOCOCCUS SALIVARIUS  Final      Susceptibility   Streptococcus salivarius - MIC*    PENICILLIN 0.25 INTERMEDIATE Intermediate     CEFTRIAXONE <=0.12 SENSITIVE Sensitive     ERYTHROMYCIN 2 RESISTANT Resistant     LEVOFLOXACIN 2 SENSITIVE Sensitive     VANCOMYCIN 1 SENSITIVE Sensitive     * STREPTOCOCCUS SALIVARIUS  Blood Culture ID Panel (Reflexed)     Status: Abnormal   Collection Time: 09/19/23  6:48 PM  Result Value Ref Range Status   Enterococcus faecalis NOT DETECTED NOT DETECTED Final   Enterococcus Faecium NOT DETECTED NOT DETECTED Final   Listeria  monocytogenes NOT DETECTED NOT DETECTED Final   Staphylococcus species NOT DETECTED NOT DETECTED Final   Staphylococcus aureus (BCID) NOT DETECTED NOT DETECTED Final   Staphylococcus epidermidis NOT DETECTED NOT DETECTED Final   Staphylococcus lugdunensis NOT DETECTED NOT DETECTED Final   Streptococcus species DETECTED (A) NOT DETECTED Final    Comment: Not Enterococcus species, Streptococcus agalactiae, Streptococcus pyogenes, or Streptococcus pneumoniae. CRITICAL RESULT CALLED TO, READ BACK BY AND VERIFIED WITH: PHARMD F.WILSON [id number] @ 1518 FH    Streptococcus agalactiae NOT DETECTED NOT DETECTED Final   Streptococcus pneumoniae NOT DETECTED NOT DETECTED Final   Streptococcus pyogenes NOT DETECTED NOT DETECTED Final   A.calcoaceticus-baumannii NOT DETECTED NOT DETECTED Final   Bacteroides fragilis NOT DETECTED NOT DETECTED Final   Enterobacterales NOT DETECTED NOT DETECTED Final   Enterobacter cloacae complex NOT DETECTED NOT DETECTED Final   Escherichia coli NOT DETECTED NOT DETECTED Final   Klebsiella aerogenes NOT DETECTED NOT DETECTED Final   Klebsiella oxytoca NOT DETECTED NOT DETECTED Final   Klebsiella pneumoniae NOT DETECTED NOT DETECTED Final   Proteus species NOT DETECTED NOT DETECTED Final   Salmonella species NOT DETECTED NOT DETECTED Final   Serratia marcescens NOT DETECTED NOT DETECTED Final   Haemophilus influenzae NOT DETECTED NOT DETECTED Final   Neisseria meningitidis NOT DETECTED NOT DETECTED Final   Pseudomonas aeruginosa NOT DETECTED NOT DETECTED Final   Stenotrophomonas maltophilia NOT DETECTED NOT DETECTED Final   Candida albicans NOT DETECTED NOT DETECTED Final   Candida auris NOT DETECTED NOT DETECTED Final   Candida glabrata NOT DETECTED NOT DETECTED Final   Candida krusei NOT DETECTED NOT DETECTED Final   Candida parapsilosis  NOT DETECTED NOT DETECTED Final   Candida tropicalis NOT DETECTED NOT DETECTED Final   Cryptococcus neoformans/gattii NOT  DETECTED NOT DETECTED Final    Comment: Performed at [facility], [address] [place], [place] [address]  Resp panel by RT-PCR (RSV, Flu A&B, Covid) Anterior Nasal Swab     Status: None   Collection Time: 09/19/23  7:30 PM   Specimen: Anterior Nasal Swab  Result Value Ref Range Status   SARS Coronavirus 2 by RT PCR NEGATIVE NEGATIVE Final    Comment: (NOTE) SARS-CoV-2 target nucleic acids are NOT DETECTED.  The SARS-CoV-2 RNA is generally detectable in upper respiratory specimens during the acute phase of infection. The lowest concentration of SARS-CoV-2 viral copies this assay can detect is 138 copies/mL. A negative result does not preclude SARS-Cov-2 infection and should not be used as the sole basis for treatment or other patient management decisions. A negative result may occur with  improper specimen collection/handling, submission of specimen other than nasopharyngeal swab, presence of viral mutation(s) within the areas targeted by this assay, and inadequate number of viral copies(<138 copies/mL). A negative result must be combined with clinical observations, patient history, and epidemiological information. The expected result is Negative.  Fact Sheet for Patients:  BloggerCourse.com  Fact Sheet for Healthcare Providers:  SeriousBroker.it  This test is no t yet approved or cleared by the [place] FDA and  has been authorized for detection and/or diagnosis of SARS-CoV-2 by FDA under an Emergency Use Authorization (EUA). This EUA will remain  in effect (meaning this test can be used) for the duration of the COVID-19 declaration under Section 564(b)(1) of the Act, 21 U.S.C.section 360bbb-3(b)(1), unless the authorization is terminated  or revoked sooner.       Influenza A by PCR NEGATIVE NEGATIVE Final   Influenza B by PCR NEGATIVE NEGATIVE Final    Comment: (NOTE) The Xpert Xpress SARS-CoV-2/FLU/RSV plus  assay is intended as an aid in the diagnosis of influenza from Nasopharyngeal swab specimens and should not be used as a sole basis for treatment. Nasal washings and aspirates are unacceptable for Xpert Xpress SARS-CoV-2/FLU/RSV testing.  Fact Sheet for Patients: BloggerCourse.com  Fact Sheet for Healthcare Providers: SeriousBroker.it  This test is not yet approved or cleared by the [place] FDA and has been authorized for detection and/or diagnosis of SARS-CoV-2 by FDA under an Emergency Use Authorization (EUA). This EUA will remain in effect (meaning this test can be used) for the duration of the COVID-19 declaration under Section 564(b)(1) of the Act, 21 U.S.C. section 360bbb-3(b)(1), unless the authorization is terminated or revoked.     Resp Syncytial Virus by PCR NEGATIVE NEGATIVE Final    Comment: (NOTE) Fact Sheet for Patients: BloggerCourse.com  Fact Sheet for Healthcare Providers: SeriousBroker.it  This test is not yet approved or cleared by the [place] FDA and has been authorized for detection and/or diagnosis of SARS-CoV-2 by FDA under an Emergency Use Authorization (EUA). This EUA will remain in effect (meaning this test can be used) for the duration of the COVID-19 declaration under Section 564(b)(1) of the Act, 21 U.S.C. section 360bbb-3(b)(1), unless the authorization is terminated or revoked.  Performed at [facility], [address], [place], [place] [address]   Respiratory (~20 pathogens) panel by PCR     Status: None   Collection Time: 09/20/23  7:20 AM   Specimen: Nasopharyngeal Swab; Respiratory  Result Value Ref Range Status   Adenovirus NOT DETECTED NOT DETECTED Final  Coronavirus 229E NOT DETECTED NOT DETECTED Final    Comment: (NOTE) The Coronavirus on the Respiratory Panel, DOES NOT test for the novel  Coronavirus (2019 nCoV)     Coronavirus HKU1 NOT DETECTED NOT DETECTED Final   Coronavirus NL63 NOT DETECTED NOT DETECTED Final   Coronavirus OC43 NOT DETECTED NOT DETECTED Final   Metapneumovirus NOT DETECTED NOT DETECTED Final   Rhinovirus / Enterovirus NOT DETECTED NOT DETECTED Final   Influenza A NOT DETECTED NOT DETECTED Final   Influenza B NOT DETECTED NOT DETECTED Final   Parainfluenza Virus 1 NOT DETECTED NOT DETECTED Final   Parainfluenza Virus 2 NOT DETECTED NOT DETECTED Final   Parainfluenza Virus 3 NOT DETECTED NOT DETECTED Final   Parainfluenza Virus 4 NOT DETECTED NOT DETECTED Final   Respiratory Syncytial Virus NOT DETECTED NOT DETECTED Final   Bordetella pertussis NOT DETECTED NOT DETECTED Final   Bordetella Parapertussis NOT DETECTED NOT DETECTED Final   Chlamydophila pneumoniae NOT DETECTED NOT DETECTED Final   Mycoplasma pneumoniae NOT DETECTED NOT DETECTED Final    Comment: Performed at [facility], [address] [place], [place] [address]  MRSA Next Gen by PCR, Nasal     Status: None   Collection Time: 09/20/23 12:35 PM   Specimen: Nasal Mucosa; Nasal Swab  Result Value Ref Range Status   MRSA by PCR Next Gen NOT DETECTED NOT DETECTED Final    Comment: (NOTE) The GeneXpert MRSA Assay (FDA approved for NASAL specimens only), is one component of a comprehensive MRSA colonization surveillance program. It is not intended to diagnose MRSA infection nor to guide or monitor treatment for MRSA infections. Test performance is not FDA approved in patients less than 9 years old. Performed at [facility], [address], [place], [place] [address]   Culture, blood (Routine X 2) w Reflex to ID Panel     Status: None (Preliminary result)   Collection Time: 09/20/23  4:55 PM   Specimen: BLOOD  Result Value Ref Range Status   Specimen Description BLOOD BLOOD LEFT ARM  Final   Special Requests   Final    BOTTLES DRAWN AEROBIC AND ANAEROBIC Blood Culture adequate volume   Culture   Final    NO  GROWTH 3 DAYS Performed at [facility], [address] [place], [place] [address]    Report Status PENDING  Incomplete  Culture, blood (Routine X 2) w Reflex to ID Panel     Status: None (Preliminary result)   Collection Time: 09/20/23  4:55 PM   Specimen: BLOOD  Result Value Ref Range Status   Specimen Description BLOOD BLOOD RIGHT HAND  Final   Special Requests   Final    BOTTLES DRAWN AEROBIC AND ANAEROBIC Blood Culture adequate volume   Culture   Final    NO GROWTH 3 DAYS Performed at [facility], [address] [place], [place] [address]    Report Status PENDING  Incomplete         Radiology Studies: No results found.       Scheduled Meds:  Chlorhexidine Gluconate Cloth  6 each Topical Daily   feeding supplement  237 mL Oral BID BM   insulin aspart  0-9 Units Subcutaneous Q4H   insulin glargine-yfgn  10 Units Subcutaneous Daily   pantoprazole (PROTONIX) IV  40 mg Intravenous Q12H   sodium chloride flush  3 mL Intravenous Q12H   Continuous Infusions:  cefTRIAXone (ROCEPHIN)  IV 2 g (09/22/23 2113)  LOS: 3 days    Time spent: 55 minutes    Emersyn Wyss Hoover Brunette, DO [facility]  If 7PM-7AM, please contact night-coverage [email or web address] 09/23/2023, 12:19 PM

## 2023-09-23 NOTE — Progress Notes (Signed)
Met pt's wife Beacher Every coming out of pt's room. She states, 'I just had it out with her in there. She's got the POA papers so she can handle everything from now on! I want my name and number removed from his chart as a contact right now! I am finished with him for good!" Asked pt's wife to tell me who she is talking about and she says, "His ex- wife! Take my name off his chart, I am done with him. Don't call me for anything!" Advised wife that I would speak with husband and clarify whom he wants as contact. Wife states, "I'm getting a Magazine features editor. I'm done!"  In to pt's room to find two women sitting at bedside. Pt identifies woman at foot of bed as his "previous wife". Advised pt of his wife's request to be removed as his contact. He states agreement, says that he wants his previous wife listed as his contact. He states that at discharge he will be going home with his previous wife until he can get his personal situation with his current wife straight.  Both patient and previous wife notified of bed assignment at [facility].

## 2023-09-23 NOTE — Progress Notes (Signed)
Called report to Lajoyce Corners, Charity fundraiser at [employer] floor at 1420. All questions and concerns answered.

## 2023-09-24 DIAGNOSIS — I350 Nonrheumatic aortic (valve) stenosis: Secondary | ICD-10-CM | POA: Diagnosis not present

## 2023-09-24 DIAGNOSIS — R7881 Bacteremia: Secondary | ICD-10-CM | POA: Diagnosis not present

## 2023-09-24 DIAGNOSIS — R7401 Elevation of levels of liver transaminase levels: Secondary | ICD-10-CM | POA: Diagnosis not present

## 2023-09-24 DIAGNOSIS — A419 Sepsis, unspecified organism: Secondary | ICD-10-CM | POA: Diagnosis not present

## 2023-09-24 DIAGNOSIS — K7460 Unspecified cirrhosis of liver: Secondary | ICD-10-CM | POA: Diagnosis not present

## 2023-09-24 DIAGNOSIS — K7581 Nonalcoholic steatohepatitis (NASH): Secondary | ICD-10-CM | POA: Diagnosis not present

## 2023-09-24 DIAGNOSIS — R6520 Severe sepsis without septic shock: Secondary | ICD-10-CM | POA: Diagnosis not present

## 2023-09-24 DIAGNOSIS — G9341 Metabolic encephalopathy: Secondary | ICD-10-CM | POA: Diagnosis not present

## 2023-09-24 DIAGNOSIS — G9340 Encephalopathy, unspecified: Secondary | ICD-10-CM | POA: Diagnosis not present

## 2023-09-24 DIAGNOSIS — B955 Unspecified streptococcus as the cause of diseases classified elsewhere: Secondary | ICD-10-CM | POA: Diagnosis not present

## 2023-09-24 LAB — GLUCOSE, CAPILLARY
Glucose-Capillary: 160 mg/dL — ABNORMAL HIGH (ref 70–99)
Glucose-Capillary: 167 mg/dL — ABNORMAL HIGH (ref 70–99)
Glucose-Capillary: 224 mg/dL — ABNORMAL HIGH (ref 70–99)
Glucose-Capillary: 265 mg/dL — ABNORMAL HIGH (ref 70–99)
Glucose-Capillary: 266 mg/dL — ABNORMAL HIGH (ref 70–99)
Glucose-Capillary: 333 mg/dL — ABNORMAL HIGH (ref 70–99)
Glucose-Capillary: 356 mg/dL — ABNORMAL HIGH (ref 70–99)

## 2023-09-24 LAB — COMPREHENSIVE METABOLIC PANEL
ALT: 75 U/L — ABNORMAL HIGH (ref 0–44)
AST: 92 U/L — ABNORMAL HIGH (ref 15–41)
Albumin: 2.1 g/dL — ABNORMAL LOW (ref 3.5–5.0)
Alkaline Phosphatase: 77 U/L (ref 38–126)
Anion gap: 8 (ref 5–15)
BUN: 13 mg/dL (ref 8–23)
CO2: 22 mmol/L (ref 22–32)
Calcium: 8.4 mg/dL — ABNORMAL LOW (ref 8.9–10.3)
Chloride: 106 mmol/L (ref 98–111)
Creatinine, Ser: 0.81 mg/dL (ref 0.61–1.24)
GFR, Estimated: >60 mL/min (ref >60)
Glucose, Bld: 170 mg/dL — ABNORMAL HIGH (ref 70–99)
Potassium: 3.3 mmol/L — ABNORMAL LOW (ref 3.5–5.1)
Sodium: 136 mmol/L (ref 135–145)
Total Bilirubin: 0.6 mg/dL (ref 0.0–1.2)
Total Protein: 5.5 g/dL — ABNORMAL LOW (ref 6.5–8.1)

## 2023-09-24 LAB — MAGNESIUM: Magnesium: 1.7 mg/dL (ref 1.7–2.4)

## 2023-09-24 LAB — CBC
HCT: 28 % — ABNORMAL LOW (ref 39.0–52.0)
Hemoglobin: 9.6 g/dL — ABNORMAL LOW (ref 13.0–17.0)
MCH: 35.7 pg — ABNORMAL HIGH (ref 26.0–34.0)
MCHC: 34.3 g/dL (ref 30.0–36.0)
MCV: 104.1 fL — ABNORMAL HIGH (ref 80.0–100.0)
Platelets: 93 10*3/uL — ABNORMAL LOW (ref 150–400)
RBC: 2.69 MIL/uL — ABNORMAL LOW (ref 4.22–5.81)
RDW: 16.3 % — ABNORMAL HIGH (ref 11.5–15.5)
WBC: 4.8 10*3/uL (ref 4.0–10.5)
nRBC: 0 % (ref 0.0–0.2)

## 2023-09-24 MED ORDER — POTASSIUM CHLORIDE CRYS ER 20 MEQ PO TBCR
40.0000 meq | EXTENDED_RELEASE_TABLET | Freq: Once | ORAL | Status: AC
  Administered 2023-09-24: 40 meq via ORAL
  Filled 2023-09-24: qty 2

## 2023-09-24 NOTE — Plan of Care (Signed)
Will continue to monitor patient.

## 2023-09-24 NOTE — Progress Notes (Signed)
Patient states, he is not going to have a procedure done. I asked [facility] to put orders in with no response over 40 mins. I have paged [facility] and waiting response

## 2023-09-24 NOTE — Consult Note (Addendum)
Consultation  Referring Provider: No ref. provider found Primary Care Physician:  [name], DO Primary Gastroenterologist:  Dr.Pyrtle  Reason for Consultation: Cirrhosis, concern for esophageal varices on imaging, need for TEE  HPI: Tony Cantu is a 73 y.o. male with history of nonalcoholic fatty liver disease/cirrhosis, decompensated with portal hypertension, history of severe aortic stenosis, atrial fibrillation for which she has been on Eliquis, hypertension, diabetes mellitus, sleep apnea ,seizure disorder, and iron deficiency anemia.. Patient originally presented to [facility] and was admitted on 09/19/2023 with an acute febrile illness.  He says he woke up that morning at about 4 AM and just felt terrible in general all over. Workup at [facility] positive for strep bacteremia for which she has been on IV antibiotics. He underwent echo that showed normal left ventricular function, grade 1 diastolic dysfunction, severe aortic stenosis with mean gradient of 46.3 mm. CT of the chest abdomen and pelvis on 09/19/2023 showed evidence of a cirrhotic appearing liver with portal hypertension and gastroesophageal varices, no ascites mentioned.  Atrophic pancreas, there was some perinephric stranding around right kidney-question pyelonephritis.  He needs to have TEE to rule out endocarditis, and due to his severe aortic stenosis this was not felt safe to be done at [facility] and transferred here late yesterday.  -Labs today show WBC of 4.8/hemoglobin 9.6/hematocrit 28.0/MCV 104/platelets 93 Potassium 3.3/BUN 13/creatinine 0.8 Albumin 2.1 AST 92/ALT 75 Blood cultures from 09/19/2023 positive for strep.  Recent INR 1.1 Reviewing his labs he had hemoglobin of 12.2 in December, 10.2 on day of admission 09/19/2023, and 9.6 today.  He did not require any transfusions at [facility].  There was mention of melena on review of notes, he says he has noticed some recent dark stool off and on  but no overt melena or Keziah, no complaints of abdominal pain, no nausea or vomiting.   Eliquis has been on hold since admission.  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  Colonoscopy -February 2024 with removal of 4 polyps largest 3 to 5 mm noted multiple diverticuli and internal hemorrhoids.  Path on the polyps 3 tubular adenomas and 1 hyperplastic polyp.   Past Medical History:  Diagnosis Date   Aortic atherosclerosis (HCC)    Aortic stenosis    Arthritis    Asthma    BPH (benign prostatic hypertrophy)    Cirrhosis (HCC) 2016   Colon polyps    Diabetes mellitus without complication (HCC)    TYPE 1    Diverticulosis    Dysrhythmia 04/19/2021   A-fib noted with possible A-fib RVR   Gastric ulcer    Gastric varices    right   Gastritis    GERD (gastroesophageal reflux disease)    Headache    History- pt states these were migraines that occurred in the 1980's   Heart murmur    History of kidney stones    Hx of adenomatous colonic polyps    Hypertension    Iron deficiency anemia due to chronic blood loss 08/16/2023   PONV (postoperative nausea and vomiting)    pt states he has never had post op nausea or vomiting   Portal hypertensive gastropathy (HCC)    Renal cyst, right    Seizures (HCC)    HYPOGLYCEMIC LAST 1 AND 1/2 YRS AGO   Sleep apnea  uses CPAP nightly   Testicle trouble    one testicle BORN WITH   Tubular adenoma of colon     Past Surgical History:  Procedure Laterality Date   BACK SURGERY     89  LOWER    CARPAL TUNNEL RELEASE Right 10/13/2015   Procedure: RIGHT CARPAL TUNNEL RELEASE;  Surgeon: Cindee Salt, MD;  Location: [facility];  Service: Orthopedics;  Laterality: Right;   CARPAL TUNNEL RELEASE Left 07/05/2016   Procedure: LEFT CARPAL TUNNEL RELEASE;  Surgeon: Cindee Salt, MD;  Location: [facility];  Service: Orthopedics;  Laterality: Left;   COLONOSCOPY WITH ESOPHAGOGASTRODUODENOSCOPY (EGD)  10/05/2022   DRUG INDUCED ENDOSCOPY N/A 07/02/2021   Procedure: DRUG INDUCED SLEEP ENDOSCOPY;  Surgeon: Christia Reading, MD;  Location: [facility] OR;  Service: ENT;  Laterality: N/A;   IMPLANTATION OF HYPOGLOSSAL NERVE STIMULATOR Right 10/01/2021   Procedure: IMPLANTATION OF HYPOGLOSSAL NERVE STIMULATOR;  Surgeon: Christia Reading, MD;  Location: [facility] OR;  Service: ENT;  Laterality: Right;   INGUINAL HERNIA REPAIR  2003   right    KNEE ARTHROSCOPY Right 03/03/2016   LUMBAR DISC SURGERY  03/1995   Dr. Daleen Squibb, discectomy   LUMBAR LAMINECTOMY/DECOMPRESSION MICRODISCECTOMY Right 10/06/2016   Procedure: RIGHT LUMBAR THREE - LUMBAR FOUR  LAMINECTOMY, FORAMINOTOMY AND MICRODISCECTOMY;  Surgeon: Shirlean Kelly, MD;  Location: [facility] OR;  Service: Neurosurgery;  Laterality: Right;   SHOULDER SURGERY  11/28/2005   left partial   SHOULDER SURGERY  07/14/2006   RIGHT   TONSILLECTOMY  AGE 69 OR 4   TOTAL KNEE ARTHROPLASTY Right 03/07/2022   Procedure: RIGHT TOTAL KNEE ARTHROPLASTY;  Surgeon: Gean Birchwood, MD;  Location: [facility];  Service: Orthopedics;  Laterality: Right;   TOTAL KNEE ARTHROPLASTY Left 06/06/2022   Procedure: LEFT TOTAL KNEE ARTHROPLASTY;  Surgeon: Gean Birchwood, MD;  Location: [facility];  Service: Orthopedics;  Laterality: Left;   TOTAL SHOULDER ARTHROPLASTY Right 11/01/2018   Procedure: RIGHT SHOULDER REVISION TO REVERSE TOTAL SHOULDER;  Surgeon: Jones Broom, MD;  Location: [facility];  Service: Orthopedics;  Laterality: Right;  CHOICE ANESTHESIA WITH INTERSCALENE BLOCK EXPAREL, NEEDS RNFA    Prior to Admission medications   Medication Sig Start Date End Date Taking? Authorizing Provider  acetaminophen (TYLENOL) 500 MG tablet Take 1,000 mg by mouth every 6 (six) hours as needed for moderate pain.   Yes [provider]  apixaban (ELIQUIS) 5 MG TABS tablet Take 1 tablet (5 mg  total) by mouth 2 (two) times daily. 06/08/23  Yes Gottschalk, Kathie Rhodes M, DO  atorvastatin (LIPITOR) 40 MG tablet Take 1 tablet (40 mg total) by mouth daily. 06/08/23  Yes Delynn Flavin M, DO  b complex vitamins tablet Take 1 tablet by mouth daily.   Yes [provider]  Cholecalciferol (DIALYVITE VITAMIN D 5000) 125 MCG (5000 UT) capsule Take 5,000 Units by mouth daily.   Yes [provider]  Chromium Picolinate (CHROMIUM PICOLATE PO) Take 1 tablet by mouth daily.   Yes [provider]  CINNAMON PO Take 1,080 mg by mouth daily.   Yes [provider]  Coenzyme Q10 (COQ10) 100 MG CAPS Take 100 mg by mouth daily.   Yes [provider]  diltiazem (CARDIZEM) 30 MG tablet Take 1 tablet (30 mg total) by mouth 2 (two) times daily. Patient must schedule annual office visit for further refills 06/26/23  Yes Hochrein, Fayrene Fearing, MD  ferrous sulfate 325 (65 FE) MG EC tablet Take 1 tablet (325 mg total) by  mouth daily with breakfast. 01/18/23  Yes Pennington, Rebekah M, PA-C  furosemide (LASIX) 20 MG tablet Take 1.5 tablets (30 mg total) by mouth daily. 06/08/23  Yes Gottschalk, Ashly M, DO  Glucagon, rDNA, (GLUCAGON EMERGENCY) 1 MG KIT INJECT AS DIRECTED INTO  UPPER ARM, THIGH OR  BUTTOCKS AS NEEDED FOR  SEVERE HYPOGLYCEMIA. SEEK  MEDICAL ATTENTION AFTER USE Patient taking differently: Inject 1 mg into the muscle as needed (HYPOGLYCEMIA). 06/14/21  Yes Deliah Boston F, FNP  Glucosamine-Chondroit-Vit C-Mn (GLUCOSAMINE 1500 COMPLEX) CAPS Take 2 capsules by mouth daily.   Yes [provider]  insulin glargine, 2 Unit Dial, (TOUJEO MAX SOLOSTAR) 300 UNIT/ML Solostar Pen Inject 20-50 Units into the skin daily. 06/08/23  Yes Gottschalk, Ashly M, DO  insulin lispro (HUMALOG) 100 UNIT/ML injection Inject 0.2-0.3 mLs (20-30 Units total) into the skin 3 (three) times daily with meals. And per sliding scale.  ** dose change Patient taking differently: Inject 20-30 Units  into the skin as needed for high blood sugar. And per sliding scale.  ** dose change Up to 6 to 8 times daily 06/08/23  Yes Delynn Flavin M, DO  Magnesium 250 MG TABS Take 250 mg by mouth daily.   Yes [provider]  montelukast (SINGULAIR) 10 MG tablet Take 1 tablet (10 mg total) by mouth at bedtime. 06/08/23  Yes Gottschalk, Kathie Rhodes M, DO  Multiple Vitamins-Minerals (EYE HEALTH) CAPS Take 1 capsule by mouth daily.   Yes [provider]  pantoprazole (PROTONIX) 40 MG tablet TAKE 1 TABLET BY MOUTH TWICE  DAILY BEFORE MEALS 06/14/23  Yes Pyrtle, Carie Caddy, MD  potassium chloride SA (KLOR-CON M) 20 MEQ tablet Take 1 tablet (20 mEq total) by mouth daily. 06/08/23  Yes Gottschalk, Ashly M, DO  ramelteon (ROZEREM) 8 MG tablet Take 1 tablet (8 mg total) by mouth at bedtime. 06/08/23  Yes Delynn Flavin M, DO  Testosterone 30 MG/ACT SOLN Apply 1 pump under each axilla once daily. 07/21/23  Yes Gottschalk, Kathie Rhodes M, DO  Turmeric Curcumin 500 MG CAPS Take 500 mg by mouth daily.   Yes [provider]  valsartan (DIOVAN) 40 MG tablet Take 1 tablet (40 mg total) by mouth daily. 06/08/23  Yes Gottschalk, Kathie Rhodes M, DO  Blood Glucose Monitoring Suppl (CONTOUR NEXT ONE) KIT Test BS QID and as needed Dx E11.9 06/04/20   Delynn Flavin M, DO  Continuous Blood Gluc Sensor (FREESTYLE LIBRE SENSOR SYSTEM) MISC Check BS eight (8) times a day. Dx E10.9 06/29/22   Delynn Flavin M, DO  glucose blood (CONTOUR NEXT TEST) test strip Test BS QID and as needed Dx E11.9 11/08/21   [name], DO  Microlet Lancets MISC Test BS QID and as needed Dx E11.9 06/04/20   [name], DO    Current Facility-Administered Medications  Medication Dose Route Frequency Provider Last Rate Last Admin   acetaminophen (TYLENOL) suppository 650 mg  650 mg Rectal Q4H PRN Sherryll Burger, Pratik D, DO   650 mg at 09/20/23 1659   albuterol (PROVENTIL) (2.5 MG/3ML) 0.083% nebulizer solution 2.5 mg  2.5 mg  Nebulization Q4H PRN Dolly Rias, MD       cefTRIAXone (ROCEPHIN) 2 g in sodium chloride 0.9 % 100 mL IVPB  2 g Intravenous Q24H Earnie Larsson, RPH 200 mL/hr at 09/23/23 2143 2 g at 09/23/23 2143   Chlorhexidine Gluconate Cloth 2 % PADS 6 each  6 each Topical Daily Sherryll Burger, Pratik D, DO   6 each at  09/23/23 1000   feeding supplement (ENSURE ENLIVE / ENSURE PLUS) liquid 237 mL  237 mL Oral BID BM Sherryll Burger, Pratik D, DO   237 mL at 09/23/23 1823   insulin aspart (novoLOG) injection 0-9 Units  0-9 Units Subcutaneous Q4H Shah, Pratik D, DO   2 Units at 09/24/23 0428   insulin glargine-yfgn [facility]) injection 10 Units  10 Units Subcutaneous Daily Maurilio Lovely D, DO   10 Units at 09/23/23 1105   pantoprazole (PROTONIX) injection 40 mg  40 mg Intravenous Q12H Shah, Pratik D, DO   40 mg at 09/24/23 1053   sodium chloride flush (NS) 0.9 % injection 3 mL  3 mL Intravenous Q12H Dolly Rias, MD   3 mL at 09/23/23 2058   sodium chloride flush (NS) 0.9 % injection 3-10 mL  3-10 mL Intravenous Q12H Randall An M, PA-C   3 mL at 09/24/23 1054   sodium chloride flush (NS) 0.9 % injection 3-10 mL  3-10 mL Intravenous PRN Randall An M, PA-C        Allergies as of 09/19/2023 - Review Complete 09/19/2023  Allergen Reaction Noted   Dimetapp children's cold-cough Other (See Comments) 02/24/2022   Erythromycin Diarrhea    Sulfonamide derivatives Diarrhea 06/22/2010    Family History  Problem Relation Age of Onset   Heart failure Mother    Heart attack Mother        in her 73's   Alzheimer's disease Mother    Diabetes Mother    Asthma Father    Suicidality Father        in his 34's   Allergic rhinitis Sister    Heart failure Maternal Grandmother    Rheum arthritis Maternal Grandmother    Breast cancer Maternal Grandmother    Heart attack Maternal Grandmother        in her 85's   Colon cancer Neg Hx    Esophageal cancer Neg Hx    Pancreatic cancer Neg Hx    Liver disease Neg Hx      Social History   Socioeconomic History   Marital status: Married    Spouse name: Not on file   Number of children: 1   Years of education: Not on file   Highest education level: Not on file  Occupational History   Occupation: retired   Tobacco Use   Smoking status: Former    Current packs/day: 0.00    Types: Cigarettes, Pipe    Start date: 1971    Quit date: 2005    Years since quitting: 20.0   Smokeless tobacco: Never   Tobacco comments:    quit 2005 smoked cigarettes for 5 yrs prior to pipe use  Vaping Use   Vaping status: Never Used  Substance and Sexual Activity   Alcohol use: Not Currently   Drug use: No   Sexual activity: Yes  Other Topics Concern   Not on file  Social History Narrative   ** Merged History Encounter **    Right handed    Social Drivers of Health   Financial Resource Strain: Low Risk  (06/09/2022)   Overall Financial Resource Strain (CARDIA)    Difficulty of Paying Living Expenses: Not hard at all  Food Insecurity: Patient Unable To Answer (09/20/2023)   Hunger Vital Sign    Worried About Running Out of Food in the Last Year: Patient unable to answer    Ran Out of Food in the Last Year: Patient unable to answer  Transportation Needs:  Patient Unable To Answer (09/20/2023)   PRAPARE - Transportation    Lack of Transportation (Medical): Patient unable to answer    Lack of Transportation (Non-Medical): Patient unable to answer  Physical Activity: Not on file  Stress: Not on file  Social Connections: Patient Unable To Answer (09/20/2023)   Social Connection and Isolation Panel [NHANES]    Frequency of Communication with Friends and Family: Patient unable to answer    Frequency of Social Gatherings with Friends and Family: Patient unable to answer    Attends Religious Services: Patient unable to answer    Active Member of Clubs or Organizations: Patient unable to answer    Attends Banker Meetings: Patient unable to answer     Marital Status: Patient unable to answer  Intimate Partner Violence: Patient Unable To Answer (09/20/2023)   Humiliation, Afraid, Rape, and Kick questionnaire    Fear of Current or Ex-Partner: Patient unable to answer    Emotionally Abused: Patient unable to answer    Physically Abused: Patient unable to answer    Sexually Abused: Patient unable to answer    Review of Systems: Pertinent positive and negative review of systems were noted in the above HPI section.  All other review of systems was otherwise negative.   Physical Exam: Vital signs in last 24 hours: Temp:  [97.8 F (36.6 C)-99.1 F (37.3 C)] 97.8 F (36.6 C) (01/26 0412) Pulse Rate:  [69-81] 77 (01/26 1100) Resp:  [16-18] 18 (01/26 1100) BP: (95-118)/(52-69) 99/54 (01/26 1100) SpO2:  [96 %-97 %] 97 % (01/26 1100) Last BM Date : 09/23/23 General:   Alert,  Well-developed, elderly WM  pleasant and cooperative in NAD Head:  Normocephalic and atraumatic. Eyes:  Sclera clear, no icterus.   Conjunctiva pink. Ears:  Normal auditory acuity. Nose:  No deformity, discharge,  or lesions. Mouth:  No deformity or lesions.   Neck:  Supple; no masses or thyromegaly. Lungs:  Clear throughout to auscultation.   No wheezes, crackles, or rhonchi.  Heart:  Regular rate and rhythm; systolic murmur Abdomen:  Soft,nontender, BS active,nonpalp mass or hsm.   Rectal: Not done Msk:  Symmetrical without gross deformities. . Pulses:  Normal pulses noted. Extremities: Trace edema. Neurologic:  Alert and  oriented x4;  grossly normal neurologically. Skin:  Intact without significant lesions or rashes.. Psych:  Alert and cooperative. Normal mood and affect.  Intake/Output from previous day: 01/25 0701 - 01/26 0700 In: 360 [P.O.:360] Out: 3350 [Urine:3350] Intake/Output this shift: Total I/O In: 720 [P.O.:720] Out: -   Lab Results: Recent Labs    09/22/23 0512 09/23/23 0427 09/24/23 0356  WBC 4.4 4.1 4.8  HGB 9.5* 9.5* 9.6*  HCT  30.1* 29.2* 28.0*  PLT 81* 87* 93*   BMET Recent Labs    09/22/23 0512 09/23/23 0427 09/24/23 0356  NA 138 136 136  K 3.7 3.6 3.3*  CL 108 108 106  CO2 23 22 22   GLUCOSE 262* 235* 170*  BUN 17 17 13   CREATININE 0.88 0.77 0.81  CALCIUM 8.5* 8.3* 8.4*   LFT Recent Labs    09/24/23 0356  PROT 5.5*  ALBUMIN 2.1*  AST 92*  ALT 75*  ALKPHOS 77  BILITOT 0.6   PT/INR Recent Labs    09/22/23 1250  LABPROT 14.0  INR 1.1   Hepatitis Panel Recent Labs    09/22/23 0512  HEPBSAG NON REACTIVE  HCVAB NON REACTIVE  HEPAIGM NON REACTIVE  HEPBIGM NON REACTIVE  IMPRESSION:  #29 73 year old white male with nonalcoholic liver disease/cirrhosis decompensated with previously documented portal hypertension  At last EGD in February 2024 he did not have esophageal varices but did have evidence of portal gastropathy and had a 4 mm gastric AVM which was treated with APC.  CT imaging during this admission does comment on gastroesophageal varices  MELD 3.0: 10 at 09/24/2023  3:56 AM MELD-Na: 7 at 09/24/2023  3:56 AM Calculated from: Serum Creatinine: 0.81 mg/dL (Using min of 1 mg/dL) at 04/26/5620  3:08 AM Serum Sodium: 136 mmol/L at 09/24/2023  3:56 AM Total Bilirubin: 0.6 mg/dL (Using min of 1 mg/dL) at 6/57/8469  6:29 AM Serum Albumin: 2.1 g/dL at 01/24/4131  4:40 AM INR(ratio): 1.1 at 09/22/2023 12:50 PM Age at listing (hypothetical): 32 years Sex: Male at 09/24/2023  3:56 AM    #2 acute strep bacteremia/sepsis with metabolic encephalopathy(resolved) Etiology of bacteremia is not entirely clear and TEE is planned to rule out endocarditis  #3 severe aortic stenosis-Per cardiology may need further evaluation for TAVR  #4 paroxysmal atrial fibrillation currently in sinus rhythm  #5 anemia, with drift in hemoglobin over the past month, heme positive stool, no melena since admission Likely secondary to portal gastropathy, rule out AVMs Patient also with previously documented  iron deficiency anemia, followed by hematology and has had periodic iron infusions.   # 5 chronic anticoagulation/Eliquis-off since admission  PLAN: Continue to trend hemoglobin daily Daily PPI Continue IV Rocephin N.p.o. after midnight tonight GI will coordinate with cardiology regarding meaning of EGD and TEE hopefully can be done at the same setting    Amy EsterwoodPA-C  09/24/2023, 12:50 PM     Attending physician's note   I have taken history, reviewed the chart and examined the patient. I performed a substantive portion of this encounter, including complete performance of at least one of the key components, in conjunction with the APP. I agree with the Advanced Practitioner's note, impression and recommendations.   Transferred from [facility] for TEE to r/o SBE in setting of Step bacteremia/sepsis with underlying severe aortic stenosis. Cardiology would like EGD (just before TEE) to r/o eso varices.  Stable MASLD liver cirrhosis with pHTN. EGD Feb 2024: No EV, mod portal hypertensive gastropathy, single small gastric AVM s/p APC. CT [facility] 1/21 does show gastroesophageal varices.  No ascites. Plts 93K, INR 1.1  Afib on eliquis (last dose 1/21)  H+ stools without any melena currently.  Likely d/t portal hypertensive gastropathy.  Neg colon 09/2022 except for small polyps, div, and hoids.   Plan: -EGD just before TEE, hopefully tomorrow (will need to coordinate with cardiology). -Continue Protonix -Continue Rocephin for now. -Trend CBC.   Edman Circle, MD [facility] [phone]   MELD 3.0: 10 at 09/24/2023  3:56 AM MELD-Na: 7 at 09/24/2023  3:56 AM Calculated from: Serum Creatinine: 0.81 mg/dL (Using min of 1 mg/dL) at 11/29/4740  5:95 AM Serum Sodium: 136 mmol/L at 09/24/2023  3:56 AM Total Bilirubin: 0.6 mg/dL (Using min of 1 mg/dL) at 6/38/7564  3:32 AM Serum Albumin: 2.1 g/dL at 9/51/8841  6:60 AM INR(ratio): 1.1 at 09/22/2023 12:50 PM Age at listing (hypothetical): 43  years Sex: Male at 09/24/2023  3:56 AM

## 2023-09-24 NOTE — Progress Notes (Signed)
[facility]    Date of Admission:  09/19/2023   Total days of antibiotics 4 ceftriaxone 2gm IV daily   ID: Tony Cantu is a 73 y.o. male with strep salivarius bacteremia and septic emboli Principal Problem:   Sepsis (HCC) Active Problems:   Stroke-like symptom   Acute encephalopathy   Transaminitis   Renal lesion   Melena   Streptococcal bacteremia    Subjective: Steadily improving. Afebrile. Still not ambulated with pt but gaining strength back to his baseline. Still not answering all questions appropriate but was encephalopathic on admit on 1/22.  Had TTE showing moderate AS  Reviewed lab results with patient and family  Medications:   Chlorhexidine Gluconate Cloth  6 each Topical Daily   feeding supplement  237 mL Oral BID BM   insulin aspart  0-9 Units Subcutaneous Q4H   insulin glargine-yfgn  10 Units Subcutaneous Daily   pantoprazole (PROTONIX) IV  40 mg Intravenous Q12H   sodium chloride flush  3 mL Intravenous Q12H   sodium chloride flush  3-10 mL Intravenous Q12H    Objective: Vital signs in last 24 hours: Temp:  [97.8 F (36.6 C)-99.1 F (37.3 C)] 97.8 F (36.6 C) (01/26 0412) Pulse Rate:  [69-81] 77 (01/26 1100) Resp:  [16-18] 18 (01/26 1100) BP: (95-118)/(52-69) 99/54 (01/26 1100) SpO2:  [96 %-97 %] 97 % (01/26 1100)  Physical Exam  Constitutional: He is oriented to person, place, and time. He appears well-developed and well-nourished. No distress.  HENT:  Mouth/Throat: Oropharynx is clear and moist. No oropharyngeal exudate.  Cardiovascular: Normal rate, regular rhythm and normal heart sounds. Exam reveals systolic murmur Pulmonary/Chest: Effort normal and breath sounds normal. No respiratory distress. He has no wheezes.  Abdominal: Soft. Bowel sounds are normal. He exhibits no distension. There is no tenderness.  Lymphadenopathy:  He has no cervical adenopathy.  Neurological: He is alert and oriented to person,  place, and time.  Skin: Skin is warm and dry. No rash noted. No erythema.  Psychiatric: He has a normal mood and affect. His behavior is normal.    Lab Results Recent Labs    09/23/23 0427 09/24/23 0356  WBC 4.1 4.8  HGB 9.5* 9.6*  HCT 29.2* 28.0*  NA 136 136  K 3.6 3.3*  CL 108 106  CO2 22 22  BUN 17 13  CREATININE 0.77 0.81   Liver Panel Recent Labs    09/23/23 0427 09/24/23 0356  PROT 5.2* 5.5*  ALBUMIN 2.3* 2.1*  AST 128* 92*  ALT 82* 75*  ALKPHOS 73 77  BILITOT 0.5 0.6   Sedimentation Rate No results for input(s): "ESRSEDRATE" in the last 72 hours. C-Reactive Protein No results for input(s): "CRP" in the last 72 hours.  Microbiology: Blood 1/22 NGTD Blood cx 1/21 strep + Streptococcus salivarius      MIC    CEFTRIAXONE <=0.12 SENS... Sensitive    ERYTHROMYCIN 2 RESISTANT Resistant    LEVOFLOXACIN 2 SENSITIVE Sensitive    PENICILLIN 0.25 INTERM... Intermediate    VANCOMYCIN 1 SENSITIVE Sensitive           Studies/Results: No results found.   Assessment/Plan: Streptococcal bacteremia concern for endocarditis = continue on ceftriaxone 2gm IV daily for PCN intermediate streptococcal bacteremia. Has had repeat blood cx which has cleared bacteremia  Still would like to pursue TEE early this week  Transaminitis = mild, likely from sepsis, steadily improving.  Encephalopathy = appears improving since admission but still  not at baseline  evaluation of this patient requires complex antimicrobial therapy evaluation and counseling and isolation needs for disease transmission risk assessment and mitigation.     [facility] Pager: [phone]  09/24/2023, 12:46 PM

## 2023-09-24 NOTE — Progress Notes (Addendum)
Progress Note    Tony Cantu  JYN:[id number] DOB: [birth year]  DOA: 09/19/2023 PCP: [name], DO      Brief Narrative:    Medical records reviewed and are as summarized below:  Tony Cantu is a 73 y.o. male with hx of type 1 diabetes, A-fib on Eliquis, cirrhosis, seizure disorder, aortic stenosis, hypertension, peptic ulcer disease, OA with bilateral knee replacement, polyclonal gammopathy, OSA on CPAP, who presented to the hospital because of general weakness, confusion and fever.   He was admitted to the hospital for sepsis secondary to Streptococcus bacteremia and acute metabolic encephalopathy.  He was also found to have esophageal varices on CT abdomen and pelvis.  TEE and EGD were recommended.  However, because of underlying severe aortic stenosis, he was deemed not to be a good candidate for sedation at  [facility].  It was recommended that patient be transferred to [facility] to have TEE and EGD concurrently.      Assessment/Plan:   Principal Problem:   Sepsis (HCC) Active Problems:   Stroke-like symptom   Acute encephalopathy   Transaminitis   Renal lesion   Melena   Streptococcal bacteremia    Body mass index is 30.97 kg/m.   Sepsis secondary Streptococcus bacteremia: Continue IV ceftriaxone.  No vegetations on 2D echo.  Plan for TEE tomorrow.  Cardiology team has been notified. Of note, could not have sedation at [facility] because of severe aortic stenosis.   Acute metabolic encephalopathy: Improved   Right-sided weakness: No acute stroke on MRI brain. PT recommended discharge to SNF   Melena, anemia: Plan for EGD tomorrow.  Notified Amy Esterwood, PA, with the gastroenterology team   Hypokalemia: Replete potassium and monitor levels   Type I DM: Continue glargine and NovoLog    Paroxysmal atrial fibrillation: Eliquis on hold   Severe aortic stenosis: 2D echo on 09/20/2023 showed EF estimated at  55 to 60%, severe aortic stenosis, grade 1 diastolic dysfunction   Liver cirrhosis, chronic thrombocytopenia: Compensated.  Gastroesophageal varices on CT abdomen but no active bleeding Elevated liver enzymes: This appears to be acute.  May be related to sepsis.  Monitor liver enzymes   Comorbidities include OSA on CPAP, iron deficiency anemia) had been receiving iron infusions as an outpatient) polyclonal gammopathy, peptic ulcer disease, bilateral knee osteoarthritis, hypertension        Diet Order             Diet NPO time specified  Diet effective midnight           Diet Heart Room service appropriate? Yes; Fluid consistency: Thin  Diet effective now                            Consultants: Cardiologist ID specialist Gastroenterologist  Procedures: None    Medications:    Chlorhexidine Gluconate Cloth  6 each Topical Daily   feeding supplement  237 mL Oral BID BM   insulin aspart  0-9 Units Subcutaneous Q4H   insulin glargine-yfgn  10 Units Subcutaneous Daily   pantoprazole (PROTONIX) IV  40 mg Intravenous Q12H   sodium chloride flush  3 mL Intravenous Q12H   sodium chloride flush  3-10 mL Intravenous Q12H   Continuous Infusions:  cefTRIAXone (ROCEPHIN)  IV 2 g (09/23/23 2143)     Anti-infectives (From admission, onward)    Start     Dose/Rate Route Frequency  Ordered Stop   09/20/23 2200  vancomycin (VANCOREADY) IVPB 1500 mg/300 mL  Status:  Discontinued        1,500 mg 150 mL/hr over 120 Minutes Intravenous Every 24 hours 09/20/23 1020 09/20/23 1529   09/20/23 2200  cefTRIAXone (ROCEPHIN) 2 g in sodium chloride 0.9 % 100 mL IVPB        2 g 200 mL/hr over 30 Minutes Intravenous Every 24 hours 09/20/23 1529     09/20/23 0600  ceFEPIme (MAXIPIME) 2 g in sodium chloride 0.9 % 100 mL IVPB  Status:  Discontinued        2 g 200 mL/hr over 30 Minutes Intravenous Every 8 hours 09/20/23 0038 09/20/23 1529   09/20/23 0045  metroNIDAZOLE (FLAGYL)  IVPB 500 mg  Status:  Discontinued        500 mg 100 mL/hr over 60 Minutes Intravenous Every 12 hours 09/20/23 0033 09/20/23 1529   09/19/23 2200  vancomycin (VANCOREADY) IVPB 2000 mg/400 mL        2,000 mg 200 mL/hr over 120 Minutes Intravenous  Once 09/19/23 2146 09/20/23 0103   09/19/23 2145  ceFEPIme (MAXIPIME) 2 g in sodium chloride 0.9 % 100 mL IVPB        2 g 200 mL/hr over 30 Minutes Intravenous  Once 09/19/23 2143 09/19/23 2247              Family Communication/Anticipated D/C date and plan/Code Status   DVT prophylaxis: Place and maintain sequential compression device Start: 09/20/23 0522     Code Status: Full Code  Family Communication: None Disposition Plan: Plan to discharge to SNF   Status is: Inpatient Remains inpatient appropriate because: Workup for bacteremia and anemia in progress       Subjective:   Interval events noted.  No abdominal pain, rectal bleeding or vomiting.  No shortness of breath or chest pain.  Objective:    Vitals:   09/23/23 1619 09/23/23 2043 09/24/23 0015 09/24/23 0412  BP:  118/67 112/69 (!) 98/52  Pulse: 75 79 81 69  Resp: 18 18 18 18   Temp: 98.4 F (36.9 C) 99.1 F (37.3 C) 98.3 F (36.8 C) 97.8 F (36.6 C)  TempSrc: Oral Oral Oral Oral  SpO2:  97% 96% 97%  Weight:      Height:       No data found.   Intake/Output Summary (Last 24 hours) at 09/24/2023 1133 Last data filed at 09/24/2023 0656 Gross per 24 hour  Intake 120 ml  Output 2950 ml  Net -2830 ml   Filed Weights   09/19/23 1709 09/20/23 1404  Weight: 95 kg 92.4 kg    Exam:  GEN: NAD SKIN: Warm and dry EYES: No pallor or icterus ENT: MMM CV: RRR PULM: CTA B ABD: soft, ND, NT, +BS CNS: AAO x 3, non focal EXT: Mild right leg edema.  No erythema or tenderness        Data Reviewed:   I have personally reviewed following labs and imaging studies:  Labs: Labs show the following:   Basic Metabolic Panel: Recent Labs  Lab  09/20/23 0258 09/21/23 0507 09/22/23 0512 09/23/23 0427 09/24/23 0356  NA 141 143 138 136 136  K 4.3 3.6 3.7 3.6 3.3*  CL 111 115* 108 108 106  CO2 20* 23 23 22 22   GLUCOSE 266* 225* 262* 235* 170*  BUN 15 18 17 17 13   CREATININE 1.13 0.95 0.88 0.77 0.81  CALCIUM 8.8* 8.8* 8.5* 8.3* 8.4*  MG 1.6* 2.1 1.8 1.9 1.7  PHOS 3.2  --   --   --   --    GFR Estimated Creatinine Clearance: 89.6 mL/min (by C-G formula based on SCr of 0.81 mg/dL). Liver Function Tests: Recent Labs  Lab 09/20/23 0258 09/21/23 0507 09/22/23 0512 09/23/23 0427 09/24/23 0356  AST 65* 164* 143* 128* 92*  ALT 54* 80* 78* 82* 75*  ALKPHOS 80 70 66 73 77  BILITOT 1.6* 0.7 0.7 0.5 0.6  PROT 6.1* 5.6* 5.4* 5.2* 5.5*  ALBUMIN 2.7* 2.5* 2.4* 2.3* 2.1*   No results for input(s): "LIPASE", "AMYLASE" in the last 168 hours. Recent Labs  Lab 09/20/23 0532  AMMONIA 13   Coagulation profile Recent Labs  Lab 09/20/23 1321 09/22/23 1250  INR 1.4* 1.1    CBC: Recent Labs  Lab 09/19/23 1848 09/20/23 0258 09/21/23 0507 09/22/23 0512 09/23/23 0427 09/24/23 0356  WBC 8.9 13.4* 6.4 4.4 4.1 4.8  NEUTROABS 7.8*  --   --   --   --   --   HGB 10.2* 10.2* 9.8* 9.5* 9.5* 9.6*  HCT 31.4* 32.1* 30.9* 30.1* 29.2* 28.0*  MCV 111.7* 114.2* 112.4* 111.1* 109.0* 104.1*  PLT 126* 105* 87* 81* 87* 93*   Cardiac Enzymes: No results for input(s): "CKTOTAL", "CKMB", "CKMBINDEX", "TROPONINI" in the last 168 hours. BNP (last 3 results) No results for input(s): "PROBNP" in the last 8760 hours. CBG: Recent Labs  Lab 09/23/23 1812 09/23/23 2030 09/24/23 0012 09/24/23 0408 09/24/23 0746  GLUCAP 364* 405* 224* 167* 160*   D-Dimer: No results for input(s): "DDIMER" in the last 72 hours. Hgb A1c: No results for input(s): "HGBA1C" in the last 72 hours. Lipid Profile: No results for input(s): "CHOL", "HDL", "LDLCALC", "TRIG", "CHOLHDL", "LDLDIRECT" in the last 72 hours. Thyroid function studies: No results for  input(s): "TSH", "T4TOTAL", "T3FREE", "THYROIDAB" in the last 72 hours.  Invalid input(s): "FREET3" Anemia work up: No results for input(s): "VITAMINB12", "FOLATE", "FERRITIN", "TIBC", "IRON", "RETICCTPCT" in the last 72 hours. Sepsis Labs: Recent Labs  Lab 09/19/23 1848 09/19/23 2045 09/20/23 0258 09/21/23 0507 09/22/23 0512 09/23/23 0427 09/24/23 0356  WBC 8.9  --    < > 6.4 4.4 4.1 4.8  LATICACIDVEN 2.3* 1.8  --   --   --   --   --    < > = values in this interval not displayed.    Microbiology Recent Results (from the past 240 hours)  Blood culture (routine x 2)     Status: Abnormal   Collection Time: 09/19/23  6:48 PM   Specimen: BLOOD LEFT HAND  Result Value Ref Range Status   Specimen Description   Final    BLOOD LEFT HAND Performed at [facility], [address], [place], [place] [address]    Special Requests   Final    BOTTLES DRAWN AEROBIC ONLY Blood Culture results may not be optimal due to an inadequate volume of blood received in culture bottles Performed at [facility], [address], [place], [place] [address]    Culture  Setup Time   Final    GRAM POSITIVE COCCI AEROBIC BOTTLE ONLY Gram Stain Report Called to,Read Back By and Verified With: KRISTY EDWARDS AT 0859 09/20/2023 BY A. SNYDER GRAM STAIN REVIEWED-AGREE WITH RESULT DRT    Culture (A)  Final    STREPTOCOCCUS SALIVARIUS SUSCEPTIBILITIES PERFORMED ON PREVIOUS CULTURE WITHIN THE LAST 5 DAYS. Performed at [facility], [address] [place],  [place] [address]    Report Status 09/22/2023 FINAL  Final  Blood culture (routine x 2)     Status: Abnormal   Collection Time: 09/19/23  6:48 PM   Specimen: BLOOD LEFT HAND  Result Value Ref Range Status   Specimen Description   Final    BLOOD LEFT HAND Performed at [facility], [address] [place], [place] [address]    Special Requests   Final    BOTTLES DRAWN AEROBIC ONLY Blood Culture results may not be optimal due to an  inadequate volume of blood received in culture bottles Performed at [facility], [address] [place], [place] [address]    Culture  Setup Time   Final    GRAM POSITIVE COCCI AEROBIC BOTTLE ONLY Gram Stain Report Called to,Read Back By and Verified With: KRISTY EDWARDS AT 0859 09/20/2023 BY A. SNYDER GRAM STAIN REVIEWED-AGREE WITH RESULT DRT CRITICAL RESULT CALLED TO, READ BACK BY AND VERIFIED WITH: PHARMD F.WILSON [id number] @ 1518 FH Performed at [facility], [address] [place], [place] [address]    Culture STREPTOCOCCUS SALIVARIUS (A)  Final   Report Status 09/22/2023 FINAL  Final   Organism ID, Bacteria STREPTOCOCCUS SALIVARIUS  Final      Susceptibility   Streptococcus salivarius - MIC*    PENICILLIN 0.25 INTERMEDIATE Intermediate     CEFTRIAXONE <=0.12 SENSITIVE Sensitive     ERYTHROMYCIN 2 RESISTANT Resistant     LEVOFLOXACIN 2 SENSITIVE Sensitive     VANCOMYCIN 1 SENSITIVE Sensitive     * STREPTOCOCCUS SALIVARIUS  Blood Culture ID Panel (Reflexed)     Status: Abnormal   Collection Time: 09/19/23  6:48 PM  Result Value Ref Range Status   Enterococcus faecalis NOT DETECTED NOT DETECTED Final   Enterococcus Faecium NOT DETECTED NOT DETECTED Final   Listeria monocytogenes NOT DETECTED NOT DETECTED Final   Staphylococcus species NOT DETECTED NOT DETECTED Final   Staphylococcus aureus (BCID) NOT DETECTED NOT DETECTED Final   Staphylococcus epidermidis NOT DETECTED NOT DETECTED Final   Staphylococcus lugdunensis NOT DETECTED NOT DETECTED Final   Streptococcus species DETECTED (A) NOT DETECTED Final    Comment: Not Enterococcus species, Streptococcus agalactiae, Streptococcus pyogenes, or Streptococcus pneumoniae. CRITICAL RESULT CALLED TO, READ BACK BY AND VERIFIED WITH: PHARMD F.WILSON [id number] @ 1518 FH    Streptococcus agalactiae NOT DETECTED NOT DETECTED Final   Streptococcus pneumoniae NOT DETECTED NOT DETECTED Final   Streptococcus pyogenes NOT DETECTED NOT  DETECTED Final   A.calcoaceticus-baumannii NOT DETECTED NOT DETECTED Final   Bacteroides fragilis NOT DETECTED NOT DETECTED Final   Enterobacterales NOT DETECTED NOT DETECTED Final   Enterobacter cloacae complex NOT DETECTED NOT DETECTED Final   Escherichia coli NOT DETECTED NOT DETECTED Final   Klebsiella aerogenes NOT DETECTED NOT DETECTED Final   Klebsiella oxytoca NOT DETECTED NOT DETECTED Final   Klebsiella pneumoniae NOT DETECTED NOT DETECTED Final   Proteus species NOT DETECTED NOT DETECTED Final   Salmonella species NOT DETECTED NOT DETECTED Final   Serratia marcescens NOT DETECTED NOT DETECTED Final   Haemophilus influenzae NOT DETECTED NOT DETECTED Final   Neisseria meningitidis NOT DETECTED NOT DETECTED Final   Pseudomonas aeruginosa NOT DETECTED NOT DETECTED Final   Stenotrophomonas maltophilia NOT DETECTED NOT DETECTED Final   Candida albicans NOT DETECTED NOT DETECTED Final   Candida auris NOT DETECTED NOT DETECTED Final   Candida glabrata NOT DETECTED NOT DETECTED Final   Candida krusei NOT DETECTED NOT DETECTED Final  Candida parapsilosis NOT DETECTED NOT DETECTED Final   Candida tropicalis NOT DETECTED NOT DETECTED Final   Cryptococcus neoformans/gattii NOT DETECTED NOT DETECTED Final    Comment: Performed at [facility], [address], [place], [place] [address]  Resp panel by RT-PCR (RSV, Flu A&B, Covid) Anterior Nasal Swab     Status: None   Collection Time: 09/19/23  7:30 PM   Specimen: Anterior Nasal Swab  Result Value Ref Range Status   SARS Coronavirus 2 by RT PCR NEGATIVE NEGATIVE Final    Comment: (NOTE) SARS-CoV-2 target nucleic acids are NOT DETECTED.  The SARS-CoV-2 RNA is generally detectable in upper respiratory specimens during the acute phase of infection. The lowest concentration of SARS-CoV-2 viral copies this assay can detect is 138 copies/mL. A negative result does not preclude SARS-Cov-2 infection and should not be used as the sole  basis for treatment or other patient management decisions. A negative result may occur with  improper specimen collection/handling, submission of specimen other than nasopharyngeal swab, presence of viral mutation(s) within the areas targeted by this assay, and inadequate number of viral copies(<138 copies/mL). A negative result must be combined with clinical observations, patient history, and epidemiological information. The expected result is Negative.  Fact Sheet for Patients:  BloggerCourse.com  Fact Sheet for Healthcare Providers:  SeriousBroker.it  This test is no t yet approved or cleared by the [place] FDA and  has been authorized for detection and/or diagnosis of SARS-CoV-2 by FDA under an Emergency Use Authorization (EUA). This EUA will remain  in effect (meaning this test can be used) for the duration of the COVID-19 declaration under Section 564(b)(1) of the Act, 21 U.S.C.section 360bbb-3(b)(1), unless the authorization is terminated  or revoked sooner.       Influenza A by PCR NEGATIVE NEGATIVE Final   Influenza B by PCR NEGATIVE NEGATIVE Final    Comment: (NOTE) The Xpert Xpress SARS-CoV-2/FLU/RSV plus assay is intended as an aid in the diagnosis of influenza from Nasopharyngeal swab specimens and should not be used as a sole basis for treatment. Nasal washings and aspirates are unacceptable for Xpert Xpress SARS-CoV-2/FLU/RSV testing.  Fact Sheet for Patients: BloggerCourse.com  Fact Sheet for Healthcare Providers: SeriousBroker.it  This test is not yet approved or cleared by the [place] FDA and has been authorized for detection and/or diagnosis of SARS-CoV-2 by FDA under an Emergency Use Authorization (EUA). This EUA will remain in effect (meaning this test can be used) for the duration of the COVID-19 declaration under Section 564(b)(1) of the Act,  21 U.S.C. section 360bbb-3(b)(1), unless the authorization is terminated or revoked.     Resp Syncytial Virus by PCR NEGATIVE NEGATIVE Final    Comment: (NOTE) Fact Sheet for Patients: BloggerCourse.com  Fact Sheet for Healthcare Providers: SeriousBroker.it  This test is not yet approved or cleared by the [place] FDA and has been authorized for detection and/or diagnosis of SARS-CoV-2 by FDA under an Emergency Use Authorization (EUA). This EUA will remain in effect (meaning this test can be used) for the duration of the COVID-19 declaration under Section 564(b)(1) of the Act, 21 U.S.C. section 360bbb-3(b)(1), unless the authorization is terminated or revoked.  Performed at [facility], [address], [place], [place] [address]   Respiratory (~20 pathogens) panel by PCR     Status: None   Collection Time: 09/20/23  7:20 AM   Specimen: Nasopharyngeal Swab; Respiratory  Result Value Ref Range Status   Adenovirus NOT DETECTED NOT  DETECTED Final   Coronavirus 229E NOT DETECTED NOT DETECTED Final    Comment: (NOTE) The Coronavirus on the Respiratory Panel, DOES NOT test for the novel  Coronavirus (2019 nCoV)    Coronavirus HKU1 NOT DETECTED NOT DETECTED Final   Coronavirus NL63 NOT DETECTED NOT DETECTED Final   Coronavirus OC43 NOT DETECTED NOT DETECTED Final   Metapneumovirus NOT DETECTED NOT DETECTED Final   Rhinovirus / Enterovirus NOT DETECTED NOT DETECTED Final   Influenza A NOT DETECTED NOT DETECTED Final   Influenza B NOT DETECTED NOT DETECTED Final   Parainfluenza Virus 1 NOT DETECTED NOT DETECTED Final   Parainfluenza Virus 2 NOT DETECTED NOT DETECTED Final   Parainfluenza Virus 3 NOT DETECTED NOT DETECTED Final   Parainfluenza Virus 4 NOT DETECTED NOT DETECTED Final   Respiratory Syncytial Virus NOT DETECTED NOT DETECTED Final   Bordetella pertussis NOT DETECTED NOT DETECTED Final   Bordetella Parapertussis NOT  DETECTED NOT DETECTED Final   Chlamydophila pneumoniae NOT DETECTED NOT DETECTED Final   Mycoplasma pneumoniae NOT DETECTED NOT DETECTED Final    Comment: Performed at [facility], [address], [place], [place] [address]  MRSA Next Gen by PCR, Nasal     Status: None   Collection Time: 09/20/23 12:35 PM   Specimen: Nasal Mucosa; Nasal Swab  Result Value Ref Range Status   MRSA by PCR Next Gen NOT DETECTED NOT DETECTED Final    Comment: (NOTE) The GeneXpert MRSA Assay (FDA approved for NASAL specimens only), is one component of a comprehensive MRSA colonization surveillance program. It is not intended to diagnose MRSA infection nor to guide or monitor treatment for MRSA infections. Test performance is not FDA approved in patients less than 79 years old. Performed at [facility], [address] [place], [place] [address]   Culture, blood (Routine X 2) w Reflex to ID Panel     Status: None (Preliminary result)   Collection Time: 09/20/23  4:55 PM   Specimen: BLOOD  Result Value Ref Range Status   Specimen Description BLOOD BLOOD LEFT ARM  Final   Special Requests   Final    BOTTLES DRAWN AEROBIC AND ANAEROBIC Blood Culture adequate volume   Culture   Final    NO GROWTH 4 DAYS Performed at [facility], [address] [place], [place] [address]    Report Status PENDING  Incomplete  Culture, blood (Routine X 2) w Reflex to ID Panel     Status: None (Preliminary result)   Collection Time: 09/20/23  4:55 PM   Specimen: BLOOD  Result Value Ref Range Status   Specimen Description BLOOD BLOOD RIGHT HAND  Final   Special Requests   Final    BOTTLES DRAWN AEROBIC AND ANAEROBIC Blood Culture adequate volume   Culture   Final    NO GROWTH 4 DAYS Performed at [facility], [address], [place], [place] [address]    Report Status PENDING  Incomplete    Procedures and diagnostic studies:  No results found.             LOS: 4 days   Sebastian Lurz  [facility]   Pager on [email or web address]. If 7PM-7AM, please contact night-coverage at [email or web address]     09/24/2023, 11:33 AM

## 2023-09-24 NOTE — H&P (View-Only) (Signed)
Consultation  Referring Provider: No ref. provider found Primary Care Physician:  [name], DO Primary Gastroenterologist:  Dr.Pyrtle  Reason for Consultation: Cirrhosis, concern for esophageal varices on imaging, need for TEE  HPI: Tony Cantu is a 73 y.o. male with history of nonalcoholic fatty liver disease/cirrhosis, decompensated with portal hypertension, history of severe aortic stenosis, atrial fibrillation for which she has been on Eliquis, hypertension, diabetes mellitus, sleep apnea ,seizure disorder, and iron deficiency anemia.. Patient originally presented to [facility] and was admitted on 09/19/2023 with an acute febrile illness.  He says he woke up that morning at about 4 AM and just felt terrible in general all over. Workup at [facility] positive for strep bacteremia for which she has been on IV antibiotics. He underwent echo that showed normal left ventricular function, grade 1 diastolic dysfunction, severe aortic stenosis with mean gradient of 46.3 mm. CT of the chest abdomen and pelvis on 09/19/2023 showed evidence of a cirrhotic appearing liver with portal hypertension and gastroesophageal varices, no ascites mentioned.  Atrophic pancreas, there was some perinephric stranding around right kidney-question pyelonephritis.  He needs to have TEE to rule out endocarditis, and due to his severe aortic stenosis this was not felt safe to be done at [facility] and transferred here late yesterday.  -Labs today show WBC of 4.8/hemoglobin 9.6/hematocrit 28.0/MCV 104/platelets 93 Potassium 3.3/BUN 13/creatinine 0.8 Albumin 2.1 AST 92/ALT 75 Blood cultures from 09/19/2023 positive for strep.  Recent INR 1.1 Reviewing his labs he had hemoglobin of 12.2 in December, 10.2 on day of admission 09/19/2023, and 9.6 today.  He did not require any transfusions at [facility].  There was mention of melena on review of notes, he says he has noticed some recent dark stool off and on  but no overt melena or Keziah, no complaints of abdominal pain, no nausea or vomiting.   Eliquis has been on hold since admission.  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  Colonoscopy -February 2024 with removal of 4 polyps largest 3 to 5 mm noted multiple diverticuli and internal hemorrhoids.  Path on the polyps 3 tubular adenomas and 1 hyperplastic polyp.   Past Medical History:  Diagnosis Date   Aortic atherosclerosis (HCC)    Aortic stenosis    Arthritis    Asthma    BPH (benign prostatic hypertrophy)    Cirrhosis (HCC) 2016   Colon polyps    Diabetes mellitus without complication (HCC)    TYPE 1    Diverticulosis    Dysrhythmia 04/19/2021   A-fib noted with possible A-fib RVR   Gastric ulcer    Gastric varices    right   Gastritis    GERD (gastroesophageal reflux disease)    Headache    History- pt states these were migraines that occurred in the 1980's   Heart murmur    History of kidney stones    Hx of adenomatous colonic polyps    Hypertension    Iron deficiency anemia due to chronic blood loss 08/16/2023   PONV (postoperative nausea and vomiting)    pt states he has never had post op nausea or vomiting   Portal hypertensive gastropathy (HCC)    Renal cyst, right    Seizures (HCC)    HYPOGLYCEMIC LAST 1 AND 1/2 YRS AGO   Sleep apnea  uses CPAP nightly   Testicle trouble    one testicle BORN WITH   Tubular adenoma of colon     Past Surgical History:  Procedure Laterality Date   BACK SURGERY     89  LOWER    CARPAL TUNNEL RELEASE Right 10/13/2015   Procedure: RIGHT CARPAL TUNNEL RELEASE;  Surgeon: Cindee Salt, MD;  Location: [facility];  Service: Orthopedics;  Laterality: Right;   CARPAL TUNNEL RELEASE Left 07/05/2016   Procedure: LEFT CARPAL TUNNEL RELEASE;  Surgeon: Cindee Salt, MD;  Location: [facility];  Service: Orthopedics;  Laterality: Left;   COLONOSCOPY WITH ESOPHAGOGASTRODUODENOSCOPY (EGD)  10/05/2022   DRUG INDUCED ENDOSCOPY N/A 07/02/2021   Procedure: DRUG INDUCED SLEEP ENDOSCOPY;  Surgeon: Christia Reading, MD;  Location: [facility] OR;  Service: ENT;  Laterality: N/A;   IMPLANTATION OF HYPOGLOSSAL NERVE STIMULATOR Right 10/01/2021   Procedure: IMPLANTATION OF HYPOGLOSSAL NERVE STIMULATOR;  Surgeon: Christia Reading, MD;  Location: [facility] OR;  Service: ENT;  Laterality: Right;   INGUINAL HERNIA REPAIR  2003   right    KNEE ARTHROSCOPY Right 03/03/2016   LUMBAR DISC SURGERY  03/1995   Dr. Daleen Squibb, discectomy   LUMBAR LAMINECTOMY/DECOMPRESSION MICRODISCECTOMY Right 10/06/2016   Procedure: RIGHT LUMBAR THREE - LUMBAR FOUR  LAMINECTOMY, FORAMINOTOMY AND MICRODISCECTOMY;  Surgeon: Shirlean Kelly, MD;  Location: [facility] OR;  Service: Neurosurgery;  Laterality: Right;   SHOULDER SURGERY  11/28/2005   left partial   SHOULDER SURGERY  07/14/2006   RIGHT   TONSILLECTOMY  AGE 69 OR 4   TOTAL KNEE ARTHROPLASTY Right 03/07/2022   Procedure: RIGHT TOTAL KNEE ARTHROPLASTY;  Surgeon: Gean Birchwood, MD;  Location: [facility];  Service: Orthopedics;  Laterality: Right;   TOTAL KNEE ARTHROPLASTY Left 06/06/2022   Procedure: LEFT TOTAL KNEE ARTHROPLASTY;  Surgeon: Gean Birchwood, MD;  Location: [facility];  Service: Orthopedics;  Laterality: Left;   TOTAL SHOULDER ARTHROPLASTY Right 11/01/2018   Procedure: RIGHT SHOULDER REVISION TO REVERSE TOTAL SHOULDER;  Surgeon: Jones Broom, MD;  Location: [facility];  Service: Orthopedics;  Laterality: Right;  CHOICE ANESTHESIA WITH INTERSCALENE BLOCK EXPAREL, NEEDS RNFA    Prior to Admission medications   Medication Sig Start Date End Date Taking? Authorizing Provider  acetaminophen (TYLENOL) 500 MG tablet Take 1,000 mg by mouth every 6 (six) hours as needed for moderate pain.   Yes [provider]  apixaban (ELIQUIS) 5 MG TABS tablet Take 1 tablet (5 mg  total) by mouth 2 (two) times daily. 06/08/23  Yes Gottschalk, Kathie Rhodes M, DO  atorvastatin (LIPITOR) 40 MG tablet Take 1 tablet (40 mg total) by mouth daily. 06/08/23  Yes Delynn Flavin M, DO  b complex vitamins tablet Take 1 tablet by mouth daily.   Yes [provider]  Cholecalciferol (DIALYVITE VITAMIN D 5000) 125 MCG (5000 UT) capsule Take 5,000 Units by mouth daily.   Yes [provider]  Chromium Picolinate (CHROMIUM PICOLATE PO) Take 1 tablet by mouth daily.   Yes [provider]  CINNAMON PO Take 1,080 mg by mouth daily.   Yes [provider]  Coenzyme Q10 (COQ10) 100 MG CAPS Take 100 mg by mouth daily.   Yes [provider]  diltiazem (CARDIZEM) 30 MG tablet Take 1 tablet (30 mg total) by mouth 2 (two) times daily. Patient must schedule annual office visit for further refills 06/26/23  Yes Hochrein, Fayrene Fearing, MD  ferrous sulfate 325 (65 FE) MG EC tablet Take 1 tablet (325 mg total) by  mouth daily with breakfast. 01/18/23  Yes Pennington, Rebekah M, PA-C  furosemide (LASIX) 20 MG tablet Take 1.5 tablets (30 mg total) by mouth daily. 06/08/23  Yes Gottschalk, Ashly M, DO  Glucagon, rDNA, (GLUCAGON EMERGENCY) 1 MG KIT INJECT AS DIRECTED INTO  UPPER ARM, THIGH OR  BUTTOCKS AS NEEDED FOR  SEVERE HYPOGLYCEMIA. SEEK  MEDICAL ATTENTION AFTER USE Patient taking differently: Inject 1 mg into the muscle as needed (HYPOGLYCEMIA). 06/14/21  Yes Deliah Boston F, FNP  Glucosamine-Chondroit-Vit C-Mn (GLUCOSAMINE 1500 COMPLEX) CAPS Take 2 capsules by mouth daily.   Yes [provider]  insulin glargine, 2 Unit Dial, (TOUJEO MAX SOLOSTAR) 300 UNIT/ML Solostar Pen Inject 20-50 Units into the skin daily. 06/08/23  Yes Gottschalk, Ashly M, DO  insulin lispro (HUMALOG) 100 UNIT/ML injection Inject 0.2-0.3 mLs (20-30 Units total) into the skin 3 (three) times daily with meals. And per sliding scale.  ** dose change Patient taking differently: Inject 20-30 Units  into the skin as needed for high blood sugar. And per sliding scale.  ** dose change Up to 6 to 8 times daily 06/08/23  Yes Delynn Flavin M, DO  Magnesium 250 MG TABS Take 250 mg by mouth daily.   Yes [provider]  montelukast (SINGULAIR) 10 MG tablet Take 1 tablet (10 mg total) by mouth at bedtime. 06/08/23  Yes Gottschalk, Kathie Rhodes M, DO  Multiple Vitamins-Minerals (EYE HEALTH) CAPS Take 1 capsule by mouth daily.   Yes [provider]  pantoprazole (PROTONIX) 40 MG tablet TAKE 1 TABLET BY MOUTH TWICE  DAILY BEFORE MEALS 06/14/23  Yes Pyrtle, Carie Caddy, MD  potassium chloride SA (KLOR-CON M) 20 MEQ tablet Take 1 tablet (20 mEq total) by mouth daily. 06/08/23  Yes Gottschalk, Ashly M, DO  ramelteon (ROZEREM) 8 MG tablet Take 1 tablet (8 mg total) by mouth at bedtime. 06/08/23  Yes Delynn Flavin M, DO  Testosterone 30 MG/ACT SOLN Apply 1 pump under each axilla once daily. 07/21/23  Yes Gottschalk, Kathie Rhodes M, DO  Turmeric Curcumin 500 MG CAPS Take 500 mg by mouth daily.   Yes [provider]  valsartan (DIOVAN) 40 MG tablet Take 1 tablet (40 mg total) by mouth daily. 06/08/23  Yes Gottschalk, Kathie Rhodes M, DO  Blood Glucose Monitoring Suppl (CONTOUR NEXT ONE) KIT Test BS QID and as needed Dx E11.9 06/04/20   Delynn Flavin M, DO  Continuous Blood Gluc Sensor (FREESTYLE LIBRE SENSOR SYSTEM) MISC Check BS eight (8) times a day. Dx E10.9 06/29/22   Delynn Flavin M, DO  glucose blood (CONTOUR NEXT TEST) test strip Test BS QID and as needed Dx E11.9 11/08/21   [name], DO  Microlet Lancets MISC Test BS QID and as needed Dx E11.9 06/04/20   [name], DO    Current Facility-Administered Medications  Medication Dose Route Frequency Provider Last Rate Last Admin   acetaminophen (TYLENOL) suppository 650 mg  650 mg Rectal Q4H PRN Sherryll Burger, Pratik D, DO   650 mg at 09/20/23 1659   albuterol (PROVENTIL) (2.5 MG/3ML) 0.083% nebulizer solution 2.5 mg  2.5 mg  Nebulization Q4H PRN Dolly Rias, MD       cefTRIAXone (ROCEPHIN) 2 g in sodium chloride 0.9 % 100 mL IVPB  2 g Intravenous Q24H Earnie Larsson, RPH 200 mL/hr at 09/23/23 2143 2 g at 09/23/23 2143   Chlorhexidine Gluconate Cloth 2 % PADS 6 each  6 each Topical Daily Sherryll Burger, Pratik D, DO   6 each at  09/23/23 1000   feeding supplement (ENSURE ENLIVE / ENSURE PLUS) liquid 237 mL  237 mL Oral BID BM Sherryll Burger, Pratik D, DO   237 mL at 09/23/23 1823   insulin aspart (novoLOG) injection 0-9 Units  0-9 Units Subcutaneous Q4H Shah, Pratik D, DO   2 Units at 09/24/23 0428   insulin glargine-yfgn [facility]) injection 10 Units  10 Units Subcutaneous Daily Maurilio Lovely D, DO   10 Units at 09/23/23 1105   pantoprazole (PROTONIX) injection 40 mg  40 mg Intravenous Q12H Shah, Pratik D, DO   40 mg at 09/24/23 1053   sodium chloride flush (NS) 0.9 % injection 3 mL  3 mL Intravenous Q12H Dolly Rias, MD   3 mL at 09/23/23 2058   sodium chloride flush (NS) 0.9 % injection 3-10 mL  3-10 mL Intravenous Q12H Randall An M, PA-C   3 mL at 09/24/23 1054   sodium chloride flush (NS) 0.9 % injection 3-10 mL  3-10 mL Intravenous PRN Randall An M, PA-C        Allergies as of 09/19/2023 - Review Complete 09/19/2023  Allergen Reaction Noted   Dimetapp children's cold-cough Other (See Comments) 02/24/2022   Erythromycin Diarrhea    Sulfonamide derivatives Diarrhea 06/22/2010    Family History  Problem Relation Age of Onset   Heart failure Mother    Heart attack Mother        in her 73's   Alzheimer's disease Mother    Diabetes Mother    Asthma Father    Suicidality Father        in his 34's   Allergic rhinitis Sister    Heart failure Maternal Grandmother    Rheum arthritis Maternal Grandmother    Breast cancer Maternal Grandmother    Heart attack Maternal Grandmother        in her 85's   Colon cancer Neg Hx    Esophageal cancer Neg Hx    Pancreatic cancer Neg Hx    Liver disease Neg Hx      Social History   Socioeconomic History   Marital status: Married    Spouse name: Not on file   Number of children: 1   Years of education: Not on file   Highest education level: Not on file  Occupational History   Occupation: retired   Tobacco Use   Smoking status: Former    Current packs/day: 0.00    Types: Cigarettes, Pipe    Start date: 1971    Quit date: 2005    Years since quitting: 20.0   Smokeless tobacco: Never   Tobacco comments:    quit 2005 smoked cigarettes for 5 yrs prior to pipe use  Vaping Use   Vaping status: Never Used  Substance and Sexual Activity   Alcohol use: Not Currently   Drug use: No   Sexual activity: Yes  Other Topics Concern   Not on file  Social History Narrative   ** Merged History Encounter **    Right handed    Social Drivers of Health   Financial Resource Strain: Low Risk  (06/09/2022)   Overall Financial Resource Strain (CARDIA)    Difficulty of Paying Living Expenses: Not hard at all  Food Insecurity: Patient Unable To Answer (09/20/2023)   Hunger Vital Sign    Worried About Running Out of Food in the Last Year: Patient unable to answer    Ran Out of Food in the Last Year: Patient unable to answer  Transportation Needs:  Patient Unable To Answer (09/20/2023)   PRAPARE - Transportation    Lack of Transportation (Medical): Patient unable to answer    Lack of Transportation (Non-Medical): Patient unable to answer  Physical Activity: Not on file  Stress: Not on file  Social Connections: Patient Unable To Answer (09/20/2023)   Social Connection and Isolation Panel [NHANES]    Frequency of Communication with Friends and Family: Patient unable to answer    Frequency of Social Gatherings with Friends and Family: Patient unable to answer    Attends Religious Services: Patient unable to answer    Active Member of Clubs or Organizations: Patient unable to answer    Attends Banker Meetings: Patient unable to answer     Marital Status: Patient unable to answer  Intimate Partner Violence: Patient Unable To Answer (09/20/2023)   Humiliation, Afraid, Rape, and Kick questionnaire    Fear of Current or Ex-Partner: Patient unable to answer    Emotionally Abused: Patient unable to answer    Physically Abused: Patient unable to answer    Sexually Abused: Patient unable to answer    Review of Systems: Pertinent positive and negative review of systems were noted in the above HPI section.  All other review of systems was otherwise negative.   Physical Exam: Vital signs in last 24 hours: Temp:  [97.8 F (36.6 C)-99.1 F (37.3 C)] 97.8 F (36.6 C) (01/26 0412) Pulse Rate:  [69-81] 77 (01/26 1100) Resp:  [16-18] 18 (01/26 1100) BP: (95-118)/(52-69) 99/54 (01/26 1100) SpO2:  [96 %-97 %] 97 % (01/26 1100) Last BM Date : 09/23/23 General:   Alert,  Well-developed, elderly WM  pleasant and cooperative in NAD Head:  Normocephalic and atraumatic. Eyes:  Sclera clear, no icterus.   Conjunctiva pink. Ears:  Normal auditory acuity. Nose:  No deformity, discharge,  or lesions. Mouth:  No deformity or lesions.   Neck:  Supple; no masses or thyromegaly. Lungs:  Clear throughout to auscultation.   No wheezes, crackles, or rhonchi.  Heart:  Regular rate and rhythm; systolic murmur Abdomen:  Soft,nontender, BS active,nonpalp mass or hsm.   Rectal: Not done Msk:  Symmetrical without gross deformities. . Pulses:  Normal pulses noted. Extremities: Trace edema. Neurologic:  Alert and  oriented x4;  grossly normal neurologically. Skin:  Intact without significant lesions or rashes.. Psych:  Alert and cooperative. Normal mood and affect.  Intake/Output from previous day: 01/25 0701 - 01/26 0700 In: 360 [P.O.:360] Out: 3350 [Urine:3350] Intake/Output this shift: Total I/O In: 720 [P.O.:720] Out: -   Lab Results: Recent Labs    09/22/23 0512 09/23/23 0427 09/24/23 0356  WBC 4.4 4.1 4.8  HGB 9.5* 9.5* 9.6*  HCT  30.1* 29.2* 28.0*  PLT 81* 87* 93*   BMET Recent Labs    09/22/23 0512 09/23/23 0427 09/24/23 0356  NA 138 136 136  K 3.7 3.6 3.3*  CL 108 108 106  CO2 23 22 22   GLUCOSE 262* 235* 170*  BUN 17 17 13   CREATININE 0.88 0.77 0.81  CALCIUM 8.5* 8.3* 8.4*   LFT Recent Labs    09/24/23 0356  PROT 5.5*  ALBUMIN 2.1*  AST 92*  ALT 75*  ALKPHOS 77  BILITOT 0.6   PT/INR Recent Labs    09/22/23 1250  LABPROT 14.0  INR 1.1   Hepatitis Panel Recent Labs    09/22/23 0512  HEPBSAG NON REACTIVE  HCVAB NON REACTIVE  HEPAIGM NON REACTIVE  HEPBIGM NON REACTIVE  IMPRESSION:  #61 73 year old white male with nonalcoholic liver disease/cirrhosis decompensated with previously documented portal hypertension  At last EGD in February 2024 he did not have esophageal varices but did have evidence of portal gastropathy and had a 4 mm gastric AVM which was treated with APC.  CT imaging during this admission does comment on gastroesophageal varices  MELD 3.0: 10 at 09/24/2023  3:56 AM MELD-Na: 7 at 09/24/2023  3:56 AM Calculated from: Serum Creatinine: 0.81 mg/dL (Using min of 1 mg/dL) at 3/32/9518  8:41 AM Serum Sodium: 136 mmol/L at 09/24/2023  3:56 AM Total Bilirubin: 0.6 mg/dL (Using min of 1 mg/dL) at 6/60/6301  6:01 AM Serum Albumin: 2.1 g/dL at 0/93/2355  7:32 AM INR(ratio): 1.1 at 09/22/2023 12:50 PM Age at listing (hypothetical): 60 years Sex: Male at 09/24/2023  3:56 AM    #2 acute strep bacteremia/sepsis with metabolic encephalopathy(resolved) Etiology of bacteremia is not entirely clear and TEE is planned to rule out endocarditis  #3 severe aortic stenosis-Per cardiology may need further evaluation for TAVR  #4 paroxysmal atrial fibrillation currently in sinus rhythm  #5 anemia, with drift in hemoglobin over the past month, heme positive stool, no melena since admission Likely secondary to portal gastropathy, rule out AVMs Patient also with previously documented  iron deficiency anemia, followed by hematology and has had periodic iron infusions.   # 5 chronic anticoagulation/Eliquis-off since admission  PLAN: Continue to trend hemoglobin daily Daily PPI Continue IV Rocephin N.p.o. after midnight tonight GI will coordinate with cardiology regarding meaning of EGD and TEE hopefully can be done at the same setting    Amy EsterwoodPA-C  09/24/2023, 12:50 PM     Attending physician's note   I have taken history, reviewed the chart and examined the patient. I performed a substantive portion of this encounter, including complete performance of at least one of the key components, in conjunction with the APP. I agree with the Advanced Practitioner's note, impression and recommendations.   Transferred from [facility] for TEE to r/o SBE in setting of Step bacteremia/sepsis with underlying severe aortic stenosis. Cardiology would like EGD (just before TEE) to r/o eso varices.  Stable MASLD liver cirrhosis with pHTN. EGD Feb 2024: No EV, mod portal hypertensive gastropathy, single small gastric AVM s/p APC. CT [facility] 1/21 does show gastroesophageal varices.  No ascites. Plts 93K, INR 1.1  Afib on eliquis (last dose 1/21)  H+ stools without any melena currently.  Likely d/t portal hypertensive gastropathy.  Neg colon 09/2022 except for small polyps, div, and hoids.   Plan: -EGD just before TEE, hopefully tomorrow (will need to coordinate with cardiology). -Continue Protonix -Continue Rocephin for now. -Trend CBC.   Edman Circle, MD [facility] [phone]   MELD 3.0: 10 at 09/24/2023  3:56 AM MELD-Na: 7 at 09/24/2023  3:56 AM Calculated from: Serum Creatinine: 0.81 mg/dL (Using min of 1 mg/dL) at 3/76/2831  5:17 AM Serum Sodium: 136 mmol/L at 09/24/2023  3:56 AM Total Bilirubin: 0.6 mg/dL (Using min of 1 mg/dL) at 02/12/736  1:06 AM Serum Albumin: 2.1 g/dL at 2/69/4854  6:27 AM INR(ratio): 1.1 at 09/22/2023 12:50 PM Age at listing (hypothetical): 13  years Sex: Male at 09/24/2023  3:56 AM

## 2023-09-24 NOTE — Plan of Care (Signed)
Despite having Inspire device implanted, patient needed 2 liters supplemental oxygen via nasal canula while sleeping.   Problem: Education: Goal: Ability to describe self-care measures that may prevent or decrease complications (Diabetes Survival Skills Education) will improve Outcome: Progressing Goal: Individualized Educational Video(s) Outcome: Progressing   Problem: Coping: Goal: Ability to adjust to condition or change in health will improve Outcome: Progressing   Problem: Fluid Volume: Goal: Ability to maintain a balanced intake and output will improve Outcome: Progressing   Problem: Health Behavior/Discharge Planning: Goal: Ability to identify and utilize available resources and services will improve Outcome: Progressing Goal: Ability to manage health-related needs will improve Outcome: Progressing   Problem: Metabolic: Goal: Ability to maintain appropriate glucose levels will improve Outcome: Progressing   Problem: Nutritional: Goal: Maintenance of adequate nutrition will improve Outcome: Progressing Goal: Progress toward achieving an optimal weight will improve Outcome: Progressing   Problem: Skin Integrity: Goal: Risk for impaired skin integrity will decrease Outcome: Progressing   Problem: Tissue Perfusion: Goal: Adequacy of tissue perfusion will improve Outcome: Progressing   Problem: Education: Goal: Knowledge of General Education information will improve Description: Including pain rating scale, medication(s)/side effects and non-pharmacologic comfort measures Outcome: Progressing   Problem: Health Behavior/Discharge Planning: Goal: Ability to manage health-related needs will improve Outcome: Progressing   Problem: Clinical Measurements: Goal: Ability to maintain clinical measurements within normal limits will improve Outcome: Progressing Goal: Will remain free from infection Outcome: Progressing Goal: Diagnostic test results will improve Outcome:  Progressing Goal: Respiratory complications will improve Outcome: Progressing Goal: Cardiovascular complication will be avoided Outcome: Progressing   Problem: Activity: Goal: Risk for activity intolerance will decrease Outcome: Progressing   Problem: Nutrition: Goal: Adequate nutrition will be maintained Outcome: Progressing   Problem: Coping: Goal: Level of anxiety will decrease Outcome: Progressing   Problem: Elimination: Goal: Will not experience complications related to bowel motility Outcome: Progressing Goal: Will not experience complications related to urinary retention Outcome: Progressing   Problem: Pain Managment: Goal: General experience of comfort will improve and/or be controlled Outcome: Progressing   Problem: Safety: Goal: Ability to remain free from injury will improve Outcome: Progressing   Problem: Skin Integrity: Goal: Risk for impaired skin integrity will decrease Outcome: Progressing

## 2023-09-24 NOTE — Progress Notes (Signed)
Rounding Note    Patient Name: Tony Cantu Date of Encounter: 09/24/2023  [facility] Cardiologist: Rollene Rotunda, MD   Subjective   No CP or dyspnea  Inpatient Medications    Scheduled Meds:  Chlorhexidine Gluconate Cloth  6 each Topical Daily   feeding supplement  237 mL Oral BID BM   insulin aspart  0-9 Units Subcutaneous Q4H   insulin glargine-yfgn  10 Units Subcutaneous Daily   pantoprazole (PROTONIX) IV  40 mg Intravenous Q12H   sodium chloride flush  3 mL Intravenous Q12H   sodium chloride flush  3-10 mL Intravenous Q12H   Continuous Infusions:  cefTRIAXone (ROCEPHIN)  IV 2 g (09/23/23 2143)   PRN Meds: acetaminophen, albuterol, sodium chloride flush   Vital Signs    Vitals:   09/23/23 1619 09/23/23 2043 09/24/23 0015 09/24/23 0412  BP:  118/67 112/69 (!) 98/52  Pulse: 75 79 81 69  Resp: 18 18 18 18   Temp: 98.4 F (36.9 C) 99.1 F (37.3 C) 98.3 F (36.8 C) 97.8 F (36.6 C)  TempSrc: Oral Oral Oral Oral  SpO2:  97% 96% 97%  Weight:      Height:        Intake/Output Summary (Last 24 hours) at 09/24/2023 0713 Last data filed at 09/24/2023 0656 Gross per 24 hour  Intake 360 ml  Output 3350 ml  Net -2990 ml      09/20/2023    2:04 PM 09/19/2023    5:09 PM 09/13/2023   10:42 AM  Last 3 Weights  Weight (lbs) 203 lb 11.3 oz 209 lb 7 oz 209 lb 12.8 oz  Weight (kg) 92.4 kg 95 kg 95.165 kg      Telemetry    Sinus with PVCs - Personally Reviewed  Physical Exam   GEN: No acute distress.   Neck: No JVD Cardiac: RRR, 3/6 systolic murmur Respiratory: Clear to auscultation bilaterally. GI: Soft, nontender, non-distended  MS: No edema Neuro:  Nonfocal  Psych: Normal affect   Labs    High Sensitivity Troponin:   Recent Labs  Lab 09/19/23 1848 09/19/23 2045  TROPONINIHS 28* 35*     Chemistry Recent Labs  Lab 09/22/23 0512 09/23/23 0427 09/24/23 0356  NA 138 136 136  K 3.7 3.6 3.3*  CL 108 108 106  CO2 23 22 22   GLUCOSE  262* 235* 170*  BUN 17 17 13   CREATININE 0.88 0.77 0.81  CALCIUM 8.5* 8.3* 8.4*  MG 1.8 1.9 1.7  PROT 5.4* 5.2* 5.5*  ALBUMIN 2.4* 2.3* 2.1*  AST 143* 128* 92*  ALT 78* 82* 75*  ALKPHOS 66 73 77  BILITOT 0.7 0.5 0.6  GFRNONAA >60 >60 >60  ANIONGAP 7 6 8      Hematology Recent Labs  Lab 09/22/23 0512 09/23/23 0427 09/24/23 0356  WBC 4.4 4.1 4.8  RBC 2.71* 2.68* 2.69*  HGB 9.5* 9.5* 9.6*  HCT 30.1* 29.2* 28.0*  MCV 111.1* 109.0* 104.1*  MCH 35.1* 35.4* 35.7*  MCHC 31.6 32.5 34.3  RDW 17.7* 16.6* 16.3*  PLT 81* 87* 93*      Patient Profile     73 y.o. male with past medical history of paroxysmal atrial fibrillation, aortic stenosis, hypertension, hyperlipidemia, diabetes mellitus, cirrhosis with gastric varices, gastric ulcers, peptic ulcer disease, obstructive sleep apnea admitted with febrile illness and found to be bacteremic.  Also with severe aortic stenosis.  Transferred to [facility] for EGD and TEE.  Echocardiogram January 2025 showed normal  LV function, mild to moderate left ventricular enlargement, grade 1 diastolic dysfunction, mild mitral stenosis, severe aortic stenosis with mean gradient 46.3 mmHg.  Assessment & Plan    1 strep bacteremia-continue antibiotics.  Patient was transferred from [facility] for transesophageal echocardiogram to rule out endocarditis.  He also will require EGD given anemia/melena.  Will try and coordinate the 2 together.  2 aortic stenosis-severe on recent echocardiogram.  Patient is an active but denies dyspnea, chest pain or syncope.  Will need further evaluation for possible TAVR as an outpatient.  3 paroxysmal atrial fibrillation-patient remains in sinus rhythm.  Given admission with anemia and planned EGD anticoagulation is on hold.  Cardizem is also on hold as blood pressure has been borderline.  4 metabolic encephalopathy-resolved.  5 history of thrombocytopenia-felt secondary to cirrhosis and possible sepsis.  6  anemia/melena-patient will require EGD as outlined.  7 history of cirrhosis-Per gastroenterology and primary service.  For questions or updates, please contact [facility] Please consult [email or web address] for contact info under        Signed, Olga Millers, MD  09/24/2023, 7:13 AM

## 2023-09-25 ENCOUNTER — Inpatient Hospital Stay (HOSPITAL_COMMUNITY): Payer: BC Managed Care – PPO

## 2023-09-25 ENCOUNTER — Inpatient Hospital Stay (HOSPITAL_COMMUNITY): Payer: BC Managed Care – PPO | Admitting: Anesthesiology

## 2023-09-25 ENCOUNTER — Encounter (HOSPITAL_COMMUNITY)
Admission: EM | Disposition: A | Discharge status: Skilled Nursing Facility | Payer: Self-pay | Source: Home / Self Care | Attending: Internal Medicine

## 2023-09-25 ENCOUNTER — Encounter (HOSPITAL_COMMUNITY): Payer: Self-pay | Admitting: Internal Medicine

## 2023-09-25 DIAGNOSIS — B3781 Candidal esophagitis: Secondary | ICD-10-CM

## 2023-09-25 DIAGNOSIS — K31819 Angiodysplasia of stomach and duodenum without bleeding: Secondary | ICD-10-CM | POA: Diagnosis not present

## 2023-09-25 DIAGNOSIS — G9341 Metabolic encephalopathy: Secondary | ICD-10-CM | POA: Diagnosis not present

## 2023-09-25 DIAGNOSIS — R7881 Bacteremia: Secondary | ICD-10-CM

## 2023-09-25 DIAGNOSIS — K7469 Other cirrhosis of liver: Secondary | ICD-10-CM

## 2023-09-25 DIAGNOSIS — A419 Sepsis, unspecified organism: Secondary | ICD-10-CM | POA: Diagnosis not present

## 2023-09-25 DIAGNOSIS — R933 Abnormal findings on diagnostic imaging of other parts of digestive tract: Secondary | ICD-10-CM

## 2023-09-25 DIAGNOSIS — K766 Portal hypertension: Secondary | ICD-10-CM | POA: Diagnosis not present

## 2023-09-25 DIAGNOSIS — R6520 Severe sepsis without septic shock: Secondary | ICD-10-CM | POA: Diagnosis not present

## 2023-09-25 DIAGNOSIS — K3189 Other diseases of stomach and duodenum: Secondary | ICD-10-CM | POA: Diagnosis not present

## 2023-09-25 DIAGNOSIS — I340 Nonrheumatic mitral (valve) insufficiency: Secondary | ICD-10-CM

## 2023-09-25 DIAGNOSIS — I350 Nonrheumatic aortic (valve) stenosis: Secondary | ICD-10-CM

## 2023-09-25 HISTORY — PX: ESOPHAGOGASTRODUODENOSCOPY (EGD) WITH PROPOFOL: SHX5813

## 2023-09-25 HISTORY — PX: TEE WITHOUT CARDIOVERSION: SHX5443

## 2023-09-25 LAB — CULTURE, BLOOD (ROUTINE X 2)
Culture: NO GROWTH
Culture: NO GROWTH
Special Requests: ADEQUATE
Special Requests: ADEQUATE

## 2023-09-25 LAB — GLUCOSE, CAPILLARY
Glucose-Capillary: 167 mg/dL — ABNORMAL HIGH (ref 70–99)
Glucose-Capillary: 202 mg/dL — ABNORMAL HIGH (ref 70–99)
Glucose-Capillary: 193 mg/dL — ABNORMAL HIGH (ref 70–99)
Glucose-Capillary: 214 mg/dL — ABNORMAL HIGH (ref 70–99)
Glucose-Capillary: 244 mg/dL — ABNORMAL HIGH (ref 70–99)
Glucose-Capillary: 383 mg/dL — ABNORMAL HIGH (ref 70–99)
Glucose-Capillary: 351 mg/dL — ABNORMAL HIGH (ref 70–99)

## 2023-09-25 LAB — COMPREHENSIVE METABOLIC PANEL
ALT: 71 U/L — ABNORMAL HIGH (ref 0–44)
AST: 77 U/L — ABNORMAL HIGH (ref 15–41)
Albumin: 2.3 g/dL — ABNORMAL LOW (ref 3.5–5.0)
Alkaline Phosphatase: 85 U/L (ref 38–126)
Anion gap: 14 (ref 5–15)
BUN: 10 mg/dL (ref 8–23)
CO2: 17 mmol/L — ABNORMAL LOW (ref 22–32)
Calcium: 8.9 mg/dL (ref 8.9–10.3)
Chloride: 107 mmol/L (ref 98–111)
Creatinine, Ser: 0.97 mg/dL (ref 0.61–1.24)
GFR, Estimated: >60 mL/min (ref >60)
Glucose, Bld: 237 mg/dL — ABNORMAL HIGH (ref 70–99)
Potassium: 4.3 mmol/L (ref 3.5–5.1)
Sodium: 138 mmol/L (ref 135–145)
Total Bilirubin: 1 mg/dL (ref 0.0–1.2)
Total Protein: 6.1 g/dL — ABNORMAL LOW (ref 6.5–8.1)

## 2023-09-25 LAB — CBC
HCT: 32.9 % — ABNORMAL LOW (ref 39.0–52.0)
Hemoglobin: 11 g/dL — ABNORMAL LOW (ref 13.0–17.0)
MCH: 36.1 pg — ABNORMAL HIGH (ref 26.0–34.0)
MCHC: 33.4 g/dL (ref 30.0–36.0)
MCV: 107.9 fL — ABNORMAL HIGH (ref 80.0–100.0)
Platelets: 104 10*3/uL — ABNORMAL LOW (ref 150–400)
RBC: 3.05 MIL/uL — ABNORMAL LOW (ref 4.22–5.81)
RDW: 16.8 % — ABNORMAL HIGH (ref 11.5–15.5)
WBC: 7.6 10*3/uL (ref 4.0–10.5)
nRBC: 0 % (ref 0.0–0.2)

## 2023-09-25 LAB — ECHO TEE
AV Mean grad: 28 mm[Hg]
AV Peak grad: 44.6 mm[Hg]
Ao pk vel: 3.34 m/s

## 2023-09-25 SURGERY — ESOPHAGOGASTRODUODENOSCOPY (EGD) WITH PROPOFOL
Anesthesia: Monitor Anesthesia Care

## 2023-09-25 MED ORDER — PROPOFOL 500 MG/50ML IV EMUL
INTRAVENOUS | Status: DC | PRN
  Administered 2023-09-25: 55 ug/kg/min via INTRAVENOUS

## 2023-09-25 MED ORDER — SODIUM CHLORIDE 0.9 % IV SOLN
INTRAVENOUS | Status: AC | PRN
  Administered 2023-09-25: 500 mL via INTRAVENOUS

## 2023-09-25 MED ORDER — SODIUM CHLORIDE 0.9 % IV SOLN: INTRAVENOUS | Status: DC | PRN

## 2023-09-25 MED ORDER — LIDOCAINE 2% (20 MG/ML) 5 ML SYRINGE
INTRAMUSCULAR | Status: DC | PRN
  Administered 2023-09-25: 20 mg via INTRAVENOUS

## 2023-09-25 MED ORDER — INSULIN ASPART 100 UNIT/ML IJ SOLN
0.0000 [IU] | Freq: Three times a day (TID) | INTRAMUSCULAR | Status: DC
  Administered 2023-09-26: 11 [IU] via SUBCUTANEOUS
  Administered 2023-09-26: 15 [IU] via SUBCUTANEOUS
  Administered 2023-09-26: 8 [IU] via SUBCUTANEOUS
  Administered 2023-09-27: 11 [IU] via SUBCUTANEOUS
  Administered 2023-09-27 (×2): 15 [IU] via SUBCUTANEOUS

## 2023-09-25 MED ORDER — INSULIN GLARGINE-YFGN 100 UNIT/ML ~~LOC~~ SOLN
15.0000 [IU] | Freq: Every day | SUBCUTANEOUS | Status: DC
  Administered 2023-09-26 – 2023-09-27 (×2): 15 [IU] via SUBCUTANEOUS
  Filled 2023-09-25 (×2): qty 0.15

## 2023-09-25 MED ORDER — APIXABAN 5 MG PO TABS
5.0000 mg | ORAL_TABLET | Freq: Two times a day (BID) | ORAL | Status: DC
  Administered 2023-09-26 – 2023-09-27 (×4): 5 mg via ORAL
  Filled 2023-09-25 (×4): qty 1

## 2023-09-25 MED ORDER — FLUCONAZOLE 100 MG PO TABS
100.0000 mg | ORAL_TABLET | Freq: Every day | ORAL | Status: DC
  Administered 2023-09-25: 100 mg via ORAL
  Filled 2023-09-25 (×2): qty 1

## 2023-09-25 MED ORDER — INSULIN ASPART 100 UNIT/ML IJ SOLN
0.0000 [IU] | Freq: Every day | INTRAMUSCULAR | Status: DC
  Administered 2023-09-25: 5 [IU] via SUBCUTANEOUS
  Administered 2023-09-26: 3 [IU] via SUBCUTANEOUS

## 2023-09-25 MED ORDER — PHENYLEPHRINE HCL-NACL 20-0.9 MG/250ML-% IV SOLN
INTRAVENOUS | Status: DC | PRN
  Administered 2023-09-25: 45 ug/min via INTRAVENOUS

## 2023-09-25 MED ORDER — INSULIN ASPART 100 UNIT/ML IJ SOLN
2.0000 [IU] | Freq: Three times a day (TID) | INTRAMUSCULAR | Status: DC
  Administered 2023-09-25 – 2023-09-26 (×3): 2 [IU] via SUBCUTANEOUS

## 2023-09-25 MED ORDER — PROPOFOL 10 MG/ML IV BOLUS
INTRAVENOUS | Status: DC | PRN
  Administered 2023-09-25: 20 mg via INTRAVENOUS
  Administered 2023-09-25: 40 mg via INTRAVENOUS

## 2023-09-25 SURGICAL SUPPLY — 14 items

## 2023-09-25 NOTE — CV Procedure (Signed)
    TRANSESOPHAGEAL ECHOCARDIOGRAM   NAME:  Tony Cantu   MRN: [id number] DOB:  1950-10-16   ADMIT DATE: 09/19/2023  INDICATIONS:  Bacteremia  PROCEDURE:   Informed consent was obtained prior to the procedure. The risks, benefits and alternatives for the procedure were discussed and the patient comprehended these risks.  Risks include, but are not limited to, cough, sore throat, vomiting, nausea, somnolence, esophageal and stomach trauma or perforation, bleeding, low blood pressure, aspiration, pneumonia, infection, trauma to the teeth and death.    After a procedural time-out, the patient was sedated by the anesthesia service. Once an appropriate level of sedation was achieved, the transesophageal probe was inserted in the esophagus and stomach without difficulty and multiple views were obtained.    COMPLICATIONS:    There were no immediate complications.  FINDINGS:  LEFT VENTRICLE: EF = 60-65%. No regional Heuerman motion abnormalities.  RIGHT VENTRICLE: Normal size and function.   LEFT ATRIUM: Moderately dilated  LEFT ATRIAL APPENDAGE: No thrombus.   RIGHT ATRIUM: Normal  AORTIC VALVE:  Trileaflet. Heavily calcified with restricted leaflet excursion. Moderate to severe AS. Mean gradient . No vegetation  MITRAL VALVE:    Normal. + MAC Mild MR. No vegetation  TRICUSPID VALVE: Normal. Mild TR. No vegetation   PULMONIC VALVE: Grossly normal. No vegetation   INTERATRIAL SEPTUM: No PFO or ASD. + lipomatous hypertrophy  PERICARDIUM: No effusion  DESCENDING AORTA: Moderate plque   CONCLUSION:  No TEE evidence of endocarditis   Anabela Crayton,MD 11:12 AM

## 2023-09-25 NOTE — TOC Initial Note (Addendum)
Transition of Care [facility]) - Initial/Assessment Note    Patient Details  Name: Tony Cantu MRN: [id number] Date of Birth: 06-06-1951  [facility]) CM/SW Contact:    Delilah Shan, LCSWA Phone Number: 09/25/2023, 2:43 PM  Clinical Narrative:                  CSW received consult for possible SNF placement at time of discharge. CSW spoke with patient at bedside regarding PT recommendation of SNF placement at time of discharge.Patient reports he lives at home with spouse.Patient expressed understanding of PT recommendation and is agreeable to SNF placement at time of discharge. Patient reports preference for [facility]. Patient gave CSW permission to fax out initial referral for SNF placement.CSW discussed insurance authorization process and will provide Medicare SNF ratings list with accepted SNF bed offers when available. No further questions reported at this time. CSW to continue to follow and assist with discharge planning needs.   Update- CSW spoke with Destiny with [facility] who confirmed unable to offer SNF bed for patient.  Expected Discharge Plan: Skilled Nursing Facility Barriers to Discharge: Continued Medical Work up   Patient Goals and CMS Choice Patient states their goals for this hospitalization and ongoing recovery are:: SNF CMS Medicare.gov Compare Post Acute Care list provided to:: Patient Choice offered to / list presented to : Patient      Expected Discharge Plan and Services In-house Referral: Clinical Social Work     Living arrangements for the past 2 months: Single Family Home                                      Prior Living Arrangements/Services Living arrangements for the past 2 months: Single Family Home Lives with:: Spouse Patient language and need for interpreter reviewed:: Yes Do you feel safe going back to the place where you live?: No   SNF  Need for Family Participation in Patient Care: Yes (Comment) Care giver  support system in place?: Yes (comment)   Criminal Activity/Legal Involvement Pertinent to Current Situation/Hospitalization: No - Comment as needed  Activities of Daily Living   ADL Screening (condition at time of admission) Independently performs ADLs?: No Does the patient have a NEW difficulty with bathing/dressing/toileting/self-feeding that is expected to last >3 days?: No Does the patient have a NEW difficulty with getting in/out of bed, walking, or climbing stairs that is expected to last >3 days?: Yes (Initiates electronic notice to provider for possible PT consult) Does the patient have a NEW difficulty with communication that is expected to last >3 days?: No Is the patient deaf or have difficulty hearing?: No Does the patient have difficulty seeing, even when wearing glasses/contacts?: No Does the patient have difficulty concentrating, remembering, or making decisions?: No  Permission Sought/Granted Permission sought to share information with : Case Manager, Magazine features editor, Family Supports Permission granted to share information with : Yes, Verbal Permission Granted  Share Information with NAME: [name]  Permission granted to share info w AGENCY: SNF  Permission granted to share info w Relationship: Ex-spouse  Permission granted to share info w Contact Information: [name] [phone]  Emotional Assessment Appearance:: Appears stated age Attitude/Demeanor/Rapport: Gracious Affect (typically observed): Calm Orientation: : Oriented to Self, Oriented to Place, Oriented to  Time, Oriented to Situation Alcohol / Substance Use: Not Applicable Psych Involvement: No (comment)  Admission diagnosis:  Sepsis (HCC) [A41.9] Fever,  unspecified fever cause [R50.9] Patient Active Problem List   Diagnosis Date Noted   Other cirrhosis of liver (HCC) 09/25/2023   Abnormal finding on GI tract imaging 09/25/2023   Esophageal candidiasis (HCC) 09/25/2023   Bacteremia  09/21/2023   Sepsis (HCC) 09/20/2023   Stroke-like symptom 09/20/2023   Acute encephalopathy 09/20/2023   Transaminitis 09/20/2023   Renal lesion 09/20/2023   Melena 09/20/2023   Iron deficiency anemia due to chronic blood loss 08/16/2023   NAFLD (nonalcoholic fatty liver disease) 04/54/0981   Portal hypertension (HCC) 08/24/2022   S/P total knee arthroplasty, left 06/06/2022   Degenerative arthritis of left knee 06/03/2022   Chronic obstructive pulmonary disease (HCC) 05/10/2022   Grade I diastolic dysfunction 03/11/2022   Postoperative fever 03/10/2022   S/P TKR (total knee replacement), right 03/07/2022   Osteoarthritis of right knee 03/04/2022   Precordial chest pain 04/20/2021   Former smoker 01/12/2021   PAF (paroxysmal atrial fibrillation) (HCC) 06/22/2020   Hypertension associated with type 2 diabetes mellitus (HCC) 05/17/2019   Hyperlipidemia associated with type 2 diabetes mellitus (HCC) 05/17/2019   GERD with esophagitis 05/17/2019   H/O total shoulder replacement, right 11/01/2018   Paroxysmal tachycardia (HCC) 01/11/2017   Nonrheumatic aortic valve stenosis 01/11/2017   Aortic atherosclerosis (HCC) 11/23/2016   HNP (herniated nucleus pulposus), lumbar 10/06/2016   Acute renal injury (HCC) 08/06/2016   Diarrhea 08/06/2016   Fever 08/06/2016   Hyperbilirubinemia 08/06/2016   Lactic acidosis 08/06/2016   Inguinal hernia 12/10/2015   Right groin pain 11/30/2015   Carpal tunnel syndrome on right 09/09/2015   Cervical spondylosis without myelopathy 09/09/2015   Thrombocytopenia (HCC) 07/21/2014   Bilateral carotid bruits 05/11/2014   Vitamin D deficiency 09/17/2013   BPH (benign prostatic hyperplasia) 05/22/2013   Low serum testosterone level 02/18/2013   Diabetes type 2, controlled (HCC) 01/10/2013   OSA (obstructive sleep apnea) 05/16/2012   Edema 02/21/2012   At risk for coronary artery disease 03/20/2011   Obesity 03/20/2011   Allergic rhinitis 06/22/2010    ASTHMA 06/22/2010   COUGH 06/22/2010   PCP:  [name], DO Pharmacy:   [facility] [place], [place] - [address] [place] [address] Phone: [phone] Fax: [phone]  [facility] - [place], [place] - [address] [place] [address] Phone: [phone] Fax: [phone]  [facility] - [place], [place] - [address] [employer] [address] [place] [address] Phone: [phone] Fax: [phone]     Social Drivers of Health (SDOH) Social History: SDOH Screenings   Food Insecurity: Patient Unable To Answer (09/20/2023)  Housing: Low Risk  (09/22/2023)  Transportation Needs: Patient Unable To Answer (09/20/2023)  Utilities: Patient Unable To Answer (09/20/2023)  Depression (PHQ2-9): Medium Risk (09/13/2023)  Financial Resource Strain: Low Risk  (06/09/2022)  Social Connections: Patient Unable To Answer (09/20/2023)  Tobacco Use: Medium Risk (09/25/2023)   SDOH Interventions:     Readmission Risk Interventions     No data to display

## 2023-09-25 NOTE — [place] FL2 (Signed)
[place] MEDICAID FL2 LEVEL OF CARE FORM     IDENTIFICATION  Patient Name: Tony Cantu Birthdate: 05/18/1951 Sex: male Admission Date (Current Location): 09/19/2023  [facility] and [place] Number:  Producer, television/film/video and Address:  [facility] [place], [address], [place], [place] [address]      Provider Number: [id number]  Attending Physician Name and Address:  Kathrynn Running, MD  Relative Name and Phone Number:       Current Level of Care: Hospital Recommended Level of Care: Skilled Nursing Facility Prior Approval Number:    Date Approved/Denied:   PASRR Number: [id number]  Discharge Plan: SNF    Current Diagnoses: Patient Active Problem List   Diagnosis Date Noted   Other cirrhosis of liver (HCC) 09/25/2023   Abnormal finding on GI tract imaging 09/25/2023   Esophageal candidiasis (HCC) 09/25/2023   Bacteremia 09/21/2023   Sepsis (HCC) 09/20/2023   Stroke-like symptom 09/20/2023   Acute encephalopathy 09/20/2023   Transaminitis 09/20/2023   Renal lesion 09/20/2023   Melena 09/20/2023   Iron deficiency anemia due to chronic blood loss 08/16/2023   NAFLD (nonalcoholic fatty liver disease) 56/21/3086   Portal hypertension (HCC) 08/24/2022   S/P total knee arthroplasty, left 06/06/2022   Degenerative arthritis of left knee 06/03/2022   Chronic obstructive pulmonary disease (HCC) 05/10/2022   Grade I diastolic dysfunction 03/11/2022   Postoperative fever 03/10/2022   S/P TKR (total knee replacement), right 03/07/2022   Osteoarthritis of right knee 03/04/2022   Precordial chest pain 04/20/2021   Former smoker 01/12/2021   PAF (paroxysmal atrial fibrillation) (HCC) 06/22/2020   Hypertension associated with type 2 diabetes mellitus (HCC) 05/17/2019   Hyperlipidemia associated with type 2 diabetes mellitus (HCC) 05/17/2019   GERD with esophagitis 05/17/2019   H/O total shoulder replacement, right 11/01/2018   Paroxysmal tachycardia (HCC)  01/11/2017   Nonrheumatic aortic valve stenosis 01/11/2017   Aortic atherosclerosis (HCC) 11/23/2016   HNP (herniated nucleus pulposus), lumbar 10/06/2016   Acute renal injury (HCC) 08/06/2016   Diarrhea 08/06/2016   Fever 08/06/2016   Hyperbilirubinemia 08/06/2016   Lactic acidosis 08/06/2016   Inguinal hernia 12/10/2015   Right groin pain 11/30/2015   Carpal tunnel syndrome on right 09/09/2015   Cervical spondylosis without myelopathy 09/09/2015   Thrombocytopenia (HCC) 07/21/2014   Bilateral carotid bruits 05/11/2014   Vitamin D deficiency 09/17/2013   BPH (benign prostatic hyperplasia) 05/22/2013   Low serum testosterone level 02/18/2013   Diabetes type 2, controlled (HCC) 01/10/2013   OSA (obstructive sleep apnea) 05/16/2012   Edema 02/21/2012   At risk for coronary artery disease 03/20/2011   Obesity 03/20/2011   Allergic rhinitis 06/22/2010   ASTHMA 06/22/2010   COUGH 06/22/2010    Orientation RESPIRATION BLADDER Height & Weight     Self, Time, Situation, Place  O2 (Nasal cannula 3 liters) Incontinent, External catheter (External Urinary catheter) Weight: 203 lb 11.3 oz (92.4 kg) Height:  5\' 8"  (172.7 cm)  BEHAVIORAL SYMPTOMS/MOOD NEUROLOGICAL BOWEL NUTRITION STATUS      Continent Diet (Please see discharge summary)  AMBULATORY STATUS COMMUNICATION OF NEEDS Skin   Extensive Assist Verbally Other (Comment) (Ecchymosis,arm,Bil.,Erythema,sacrum,heel,Bil.,Wound/Incision LDAs,Incision closed,Knee,Left)                       Personal Care Assistance Level of Assistance  Bathing, Feeding, Dressing Bathing Assistance: Maximum assistance Feeding assistance: Independent Dressing Assistance: Maximum assistance     Functional Limitations Info  Sight, Hearing, Speech  Sight Info:  Financial trader) Hearing Info: Adequate Speech Info: Adequate    SPECIAL CARE FACTORS FREQUENCY  PT (By licensed PT), OT (By licensed OT)     PT Frequency: 5x min weekly OT Frequency: 5x  min weekly            Contractures Contractures Info: Not present    Additional Factors Info  Code Status, Allergies, Insulin Sliding Scale Code Status Info: FULL Allergies Info: [employer]etapp Children's Cold-cough,Erythromycin,Sulfonamide Derivatives   Insulin Sliding Scale Info: insulin aspart (novoLOG) injection 0-9 Units every 4 hours,insulin glargine-yfgn (SEMGLEE) injection 10 Units daily       Current Medications (09/25/2023):  This is the current hospital active medication list Current Facility-Administered Medications  Medication Dose Route Frequency Provider Last Rate Last Admin   acetaminophen (TYLENOL) suppository 650 mg  650 mg Rectal Q4H PRN Bensimhon, Bevelyn Buckles, MD   650 mg at 09/20/23 1659   albuterol (PROVENTIL) (2.5 MG/3ML) 0.083% nebulizer solution 2.5 mg  2.5 mg Nebulization Q4H PRN Bensimhon, Bevelyn Buckles, MD       cefTRIAXone (ROCEPHIN) 2 g in sodium chloride 0.9 % 100 mL IVPB  2 g Intravenous Q24H Bensimhon, Bevelyn Buckles, MD 200 mL/hr at 09/24/23 2114 2 g at 09/24/23 2114   Chlorhexidine Gluconate Cloth 2 % PADS 6 each  6 each Topical Daily Bensimhon, Bevelyn Buckles, MD   6 each at 09/25/23 0819   feeding supplement (ENSURE ENLIVE / ENSURE PLUS) liquid 237 mL  237 mL Oral BID BM Bensimhon, Bevelyn Buckles, MD   237 mL at 09/25/23 1418   fluconazole (DIFLUCAN) tablet 100 mg  100 mg Oral Daily Alcide Evener M, NP   100 mg at 09/25/23 1418   insulin aspart (novoLOG) injection 0-9 Units  0-9 Units Subcutaneous Q4H Bensimhon, Bevelyn Buckles, MD   3 Units at 09/25/23 1228   insulin glargine-yfgn (SEMGLEE) injection 10 Units  10 Units Subcutaneous Daily Bensimhon, Bevelyn Buckles, MD   10 Units at 09/25/23 1229   pantoprazole (PROTONIX) injection 40 mg  40 mg Intravenous Q12H Bensimhon, Bevelyn Buckles, MD   40 mg at 09/25/23 1228   sodium chloride flush (NS) 0.9 % injection 3 mL  3 mL Intravenous Q12H Bensimhon, Bevelyn Buckles, MD   3 mL at 09/25/23 0981     Discharge Medications: Please see discharge summary  for a list of discharge medications.  Relevant Imaging Results:  Relevant Lab Results:   Additional Information [id number]  Delilah Shan, LCSWA

## 2023-09-25 NOTE — Progress Notes (Signed)
PHARMACY - ANTICOAGULATION CONSULT NOTE  Pharmacy Consult for apixaban Indication: atrial fibrillation  Allergies  Allergen Reactions   Dimet[employer] Children[facility] Cold-Cough Other (See Comments)    Chest discomfort    Erythromycin Diarrhea   Sulfonamide Derivatives Diarrhea    Patient Measurements: Height: 5\' 8"  (172.7 cm) Weight: 92.4 kg (203 lb 11.3 oz) IBW/kg (Calculated) : 68.4   Vital Signs: Temp: 98.1 F (36.7 C) (01/27 1617) Temp Source: Oral (01/27 1617) BP: 101/55 (01/27 1617) Pulse Rate: 74 (01/27 1617)  Labs: Recent Labs    09/23/23 0427 09/24/23 0356 09/25/23 1217 09/25/23 1318  HGB 9.5* 9.6*  --  11.0*  HCT 29.2* 28.0*  --  32.9*  PLT 87* 93*  --  104*  CREATININE 0.77 0.81 0.97  --     Estimated Creatinine Clearance: 74.8 mL/min (by C-G formula based on SCr of 0.97 mg/dL).   Medical History: Past Medical History:  Diagnosis Date   Aortic atherosclerosis (HCC)    Aortic stenosis    Arthritis    Asthma    BPH (benign prostatic hypertrophy)    Cirrhosis (HCC) 2016   Colon polyps    Diabetes mellitus without complication (HCC)    TYPE 1    Diverticulosis    Dysrhythmia 04/19/2021   A-fib noted with possible A-fib RVR   Gastric ulcer    Gastric varices    right   Gastritis    GERD (gastroesophageal reflux disease)    Headache    History- pt states these were migraines that occurred in the 1980's   Heart murmur    History of kidney stones    Hx of adenomatous colonic polyps    Hypertension    Iron deficiency anemia due to chronic blood loss 08/16/2023   Portal hypertensive gastropathy (HCC)    Renal cyst, right    Seizures (HCC)    HYPOGLYCEMIC LAST 1 AND 1/2 YRS AGO   Sleep apnea    uses CPAP nightly   Testicle trouble    one testicle BORN WITH   Tubular adenoma of colon     Medications:  Medications Prior to Admission  Medication Sig Dispense Refill Last Dose/Taking   acetaminophen (TYLENOL) 500 MG tablet Take 1,000 mg by mouth  every 6 (six) hours as needed for moderate pain.   09/19/2023 Evening   apixaban (ELIQUIS) 5 MG TABS tablet Take 1 tablet (5 mg total) by mouth 2 (two) times daily. 180 tablet 3 09/18/2023 at  6:00 PM   atorvastatin (LIPITOR) 40 MG tablet Take 1 tablet (40 mg total) by mouth daily. 90 tablet 3 09/18/2023 Bedtime   b complex vitamins tablet Take 1 tablet by mouth daily.   09/18/2023 Evening   Cholecalciferol (DIALYVITE VITAMIN D 5000) 125 MCG (5000 UT) capsule Take 5,000 Units by mouth daily.   09/18/2023 Evening   Chromium Picolinate (CHROMIUM PICOLATE PO) Take 1 tablet by mouth daily.   09/18/2023 Evening   CINNAMON PO Take 1,080 mg by mouth daily.   09/18/2023 Evening   Coenzyme Q10 (COQ10) 100 MG CAPS Take 100 mg by mouth daily.   09/18/2023 Evening   diltiazem (CARDIZEM) 30 MG tablet Take 1 tablet (30 mg total) by mouth 2 (two) times daily. Patient must schedule annual office visit for further refills 15 tablet 0 09/18/2023 Bedtime   ferrous sulfate 325 (65 FE) MG EC tablet Take 1 tablet (325 mg total) by mouth daily with breakfast. 90 tablet 3 09/18/2023 Morning   furosemide (LASIX) 20 MG  tablet Take 1.5 tablets (30 mg total) by mouth daily. 135 tablet 3 09/18/2023 Morning   Glucagon, rDNA, (GLUCAGON EMERGENCY) 1 MG KIT INJECT AS DIRECTED INTO  UPPER ARM, THIGH OR  BUTTOCKS AS NEEDED FOR  SEVERE HYPOGLYCEMIA. SEEK  MEDICAL ATTENTION AFTER USE (Patient taking differently: Inject 1 mg into the muscle as needed (HYPOGLYCEMIA).) 3 kit 0 Taking Differently   Glucosamine-Chondroit-Vit C-Mn (GLUCOSAMINE 1500 COMPLEX) CAPS Take 2 capsules by mouth daily.   09/18/2023 Evening   insulin glargine, 2 Unit Dial, (TOUJEO MAX SOLOSTAR) 300 UNIT/ML Solostar Pen Inject 20-50 Units into the skin daily. 15 mL 3 09/18/2023 Bedtime   insulin lispro (HUMALOG) 100 UNIT/ML injection Inject 0.2-0.3 mLs (20-30 Units total) into the skin 3 (three) times daily with meals. And per sliding scale.  ** dose change (Patient taking  differently: Inject 20-30 Units into the skin as needed for high blood sugar. And per sliding scale.  ** dose change Up to 6 to 8 times daily) 80 mL 3 09/19/2023 Noon   Magnesium 250 MG TABS Take 250 mg by mouth daily.   09/18/2023 Evening   montelukast (SINGULAIR) 10 MG tablet Take 1 tablet (10 mg total) by mouth at bedtime. 90 tablet 3 09/18/2023 Bedtime   Multiple Vitamins-Minerals (EYE HEALTH) CAPS Take 1 capsule by mouth daily.   09/18/2023 Evening   pantoprazole (PROTONIX) 40 MG tablet TAKE 1 TABLET BY MOUTH TWICE  DAILY BEFORE MEALS 180 tablet 0 09/18/2023 Bedtime   potassium chloride SA (KLOR-CON M) 20 MEQ tablet Take 1 tablet (20 mEq total) by mouth daily. 90 tablet 3 09/18/2023 Morning   ramelteon (ROZEREM) 8 MG tablet Take 1 tablet (8 mg total) by mouth at bedtime. 90 tablet 3 09/18/2023 Bedtime   Testosterone 30 MG/ACT SOLN Apply 1 pump under each axilla once daily. 270 mL 1 09/17/2023   Turmeric Curcumin 500 MG CAPS Take 500 mg by mouth daily.   09/18/2023 Evening   valsartan (DIOVAN) 40 MG tablet Take 1 tablet (40 mg total) by mouth daily. 90 tablet 3 09/18/2023 Bedtime   Blood Glucose Monitoring Suppl (CONTOUR NEXT ONE) KIT Test BS QID and as needed Dx E11.9 500 kit 3    Continuous Blood Gluc Sensor (FREESTYLE LIBRE SENSOR SYSTEM) MISC Check BS eight (8) times a day. Dx E10.9 7 each 3    glucose blood (CONTOUR NEXT TEST) test strip Test BS QID and as needed Dx E11.9 400 strip 3    Microlet Lancets MISC Test BS QID and as needed Dx E11.9 500 each 3     Assessment: 73 y.o. M s/p TEE and EGD today. To restart home apixaban tonight. EGD negative for bleeding. CBC stable.  Goal of Therapy:  Prevention of CVA Monitor platelets by anticoagulation protocol: Yes   Plan:  Apixaban 5mg  po BID Pharmacy will sign off - please reconsult if needed  Christoper Fabian, PharmD, BCPS Please see amion for complete clinical pharmacist phone list 09/25/2023,4:49 PM

## 2023-09-25 NOTE — Anesthesia Postprocedure Evaluation (Signed)
Anesthesia Post Note  Patient: Tony Cantu  Procedure(s) Performed: ESOPHAGOGASTRODUODENOSCOPY (EGD) WITH PROPOFOL TRANSESOPHAGEAL ECHOCARDIOGRAM (TEE)     Patient location during evaluation: Endoscopy Anesthesia Type: MAC Level of consciousness: awake and alert Pain management: pain level controlled Vital Signs Assessment: post-procedure vital signs reviewed and stable Respiratory status: spontaneous breathing, nonlabored ventilation, respiratory function stable and patient connected to nasal cannula oxygen Cardiovascular status: blood pressure returned to baseline and stable Postop Assessment: no apparent nausea or vomiting Anesthetic complications: no  No notable events documented.  Last Vitals:  Vitals:   09/25/23 1120 09/25/23 1210  BP: (!) 98/56 129/65  Pulse: 82 63  Resp: 16 16  Temp:  36.5 C  SpO2: 95% 99%    Last Pain:  Vitals:   09/25/23 1210  TempSrc: Oral  PainSc: 0-No pain                 [name]

## 2023-09-25 NOTE — Plan of Care (Signed)
  Problem: Pain Managment: Goal: General experience of comfort will improve and/or be controlled Outcome: Progressing   Problem: Safety: Goal: Ability to remain free from injury will improve Outcome: Progressing   Problem: Skin Integrity: Goal: Risk for impaired skin integrity will decrease Outcome: Progressing

## 2023-09-25 NOTE — Progress Notes (Signed)
Rounding Note    Patient Name: Tony Cantu Date of Encounter: 09/25/2023  [facility] Cardiologist: Rollene Rotunda, MD   Subjective   Denies CP or dyspnea  Inpatient Medications    Scheduled Meds:  Chlorhexidine Gluconate Cloth  6 each Topical Daily   feeding supplement  237 mL Oral BID BM   insulin aspart  0-9 Units Subcutaneous Q4H   insulin glargine-yfgn  10 Units Subcutaneous Daily   pantoprazole (PROTONIX) IV  40 mg Intravenous Q12H   sodium chloride flush  3 mL Intravenous Q12H   sodium chloride flush  3-10 mL Intravenous Q12H   Continuous Infusions:  cefTRIAXone (ROCEPHIN)  IV 2 g (09/24/23 2114)   PRN Meds: acetaminophen, albuterol, sodium chloride flush   Vital Signs    Vitals:   09/24/23 2040 09/24/23 2350 09/25/23 0355 09/25/23 0817  BP: (!) 96/58 (!) 96/54 (!) 101/57 (!) 95/57  Pulse:   62 70  Resp: 17 16 18 17   Temp: 98.7 F (37.1 C) 98.9 F (37.2 C) 98.1 F (36.7 C) (!) 97.4 F (36.3 C)  TempSrc: Oral Axillary Axillary Oral  SpO2: 97%  97% 99%  Weight:      Height:        Intake/Output Summary (Last 24 hours) at 09/25/2023 0847 Last data filed at 09/25/2023 0817 Gross per 24 hour  Intake 963 ml  Output 2575 ml  Net -1612 ml      09/20/2023    2:04 PM 09/19/2023    5:09 PM 09/13/2023   10:42 AM  Last 3 Weights  Weight (lbs) 203 lb 11.3 oz 209 lb 7 oz 209 lb 12.8 oz  Weight (kg) 92.4 kg 95 kg 95.165 kg      Telemetry    Sinus with PVCs - Personally Reviewed  Physical Exam   GEN: NAD Neck: supple Cardiac: RRR, 3/6 systolic murmur, no rub Respiratory: CTA GI: Soft, NT/ND MS: No edema Neuro:  grossly intact Psych: Normal affect   Labs    High Sensitivity Troponin:   Recent Labs  Lab 09/19/23 1848 09/19/23 2045  TROPONINIHS 28* 35*     Chemistry Recent Labs  Lab 09/22/23 0512 09/23/23 0427 09/24/23 0356  NA 138 136 136  K 3.7 3.6 3.3*  CL 108 108 106  CO2 23 22 22   GLUCOSE 262* 235* 170*  BUN 17 17  13   CREATININE 0.88 0.77 0.81  CALCIUM 8.5* 8.3* 8.4*  MG 1.8 1.9 1.7  PROT 5.4* 5.2* 5.5*  ALBUMIN 2.4* 2.3* 2.1*  AST 143* 128* 92*  ALT 78* 82* 75*  ALKPHOS 66 73 77  BILITOT 0.7 0.5 0.6  GFRNONAA >60 >60 >60  ANIONGAP 7 6 8      Hematology Recent Labs  Lab 09/22/23 0512 09/23/23 0427 09/24/23 0356  WBC 4.4 4.1 4.8  RBC 2.71* 2.68* 2.69*  HGB 9.5* 9.5* 9.6*  HCT 30.1* 29.2* 28.0*  MCV 111.1* 109.0* 104.1*  MCH 35.1* 35.4* 35.7*  MCHC 31.6 32.5 34.3  RDW 17.7* 16.6* 16.3*  PLT 81* 87* 93*      Patient Profile     73 y.o. male with past medical history of paroxysmal atrial fibrillation, aortic stenosis, hypertension, hyperlipidemia, diabetes mellitus, cirrhosis with gastric varices, gastric ulcers, peptic ulcer disease, obstructive sleep apnea admitted with febrile illness and found to be bacteremic.  Also with severe aortic stenosis.  Transferred from [facility] to [facility] for EGD and TEE.  Echocardiogram January 2025 showed normal LV function,  mild to moderate left ventricular enlargement, grade 1 diastolic dysfunction, mild mitral stenosis, severe aortic stenosis with mean gradient 46.3 mmHg.  Assessment & Plan    1 strep bacteremia-continue Rocephin.  Patient was transferred from [facility] for transesophageal echocardiogram to rule out endocarditis.  He also will require EGD given anemia/melena.  The two have been coordinated together.  2 aortic stenosis-severe on recent echocardiogram.  Patient is very inactive; however he denies dyspnea, chest pain or syncope.  Will need further evaluation for possible TAVR as an outpatient.  3 paroxysmal atrial fibrillation-patient remains in sinus rhythm.  Given admission with anemia and planned EGD anticoagulation is on hold.  Cardizem is also on hold as blood pressure has been borderline.  4 metabolic encephalopathy-resolved.  5 history of thrombocytopenia-felt secondary to cirrhosis and possible sepsis.  Most  recent platelet count 93,000.  6 anemia/melena-for EGD today.  7 history of cirrhosis-Per gastroenterology and primary service.  For questions or updates, please contact [facility] Please consult [email or web address] for contact info under        Signed, Olga Millers, MD  09/25/2023, 8:47 AM

## 2023-09-25 NOTE — Progress Notes (Signed)
Echocardiogram Echocardiogram Transesophageal has been performed.  Tony Cantu 09/25/2023, 11:10 AM

## 2023-09-25 NOTE — Op Note (Signed)
[facility] Patient Name: Tony Cantu Procedure Date : 09/25/2023 MRN: [id number] Attending MD: Starr Lake. Myrtie Neither , MD, [id number] Date of Birth: Sep 27, 1950 CSN: [id number] Age: 73 Admit Type: Inpatient Procedure:                Upper GI endoscopy Indications:              Cirrhosis rule out esophageal varices, Abnormal CT                            of the GI tract (CTAP showing changes of portal                            hypertension)                           Patient admitted for sepsis picture with concern                            for endocarditis. Cardiology requesting upper                            endoscopic evaluation prior to proceeding with TEE. Providers:                Starr Lake. Myrtie Neither, MD, Rogue Jury, RN, Rozetta Nunnery, Technician Referring MD:             Cardiology service and [facility] hospitalist Medicines:                Monitored Anesthesia Care Complications:            No immediate complications. Estimated Blood Loss:     Estimated blood loss: none. Procedure:                Pre-Anesthesia Assessment:                           - Prior to the procedure, a History and Physical                            was performed, and patient medications and                            allergies were reviewed. The patient's tolerance of                            previous anesthesia was also reviewed. The risks                            and benefits of the procedure and the sedation                            options and risks were discussed with the patient.  All questions were answered, and informed consent                            was obtained. Prior Anticoagulants: The patient has                            taken Eliquis (apixaban), last dose was 7 days                            prior to procedure. ASA Grade Assessment: IV - A                            patient with severe systemic disease that is a                             constant threat to life. After reviewing the risks                            and benefits, the patient was deemed in                            satisfactory condition to undergo the procedure.                           After obtaining informed consent, the endoscope was                            passed under direct vision. Throughout the                            procedure, the patient's blood pressure, pulse, and                            oxygen saturations were monitored continuously. The                            [id number]-[id number] ([id number]) Olympus endoscope was introduced                            through the mouth, and advanced to the second part                            of duodenum. The upper GI endoscopy was                            accomplished without difficulty. The patient                            tolerated the procedure well. Scope In: Scope Out: Findings:      Diffuse plaques were found in the entire esophagus.      There is no endoscopic evidence of varices in the entire esophagus.      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 There were also several scattered small nonbleeding AVMs       elsewhere in the stomach.      Portal hypertensive gastropathy was found in the entire examined stomach.      There is no endoscopic evidence of varices in the entire examined       stomach. (Examined in forward view and retroflexion)      The examined duodenum was normal. Impression:               - Esophageal plaques were found, consistent with                            candidiasis.                           - Gastric antral vascular ectasia without bleeding.                           - Portal hypertensive gastropathy.                           - Normal examined duodenum.                           - No specimens collected. Recommendation:           - Dr. Gala Romney to perform TEE immediately, while                             patient still under same sedation.                           After the procedure is finished, the patient can                            have a regular diet from a GI perspective                           Fluconazole 100 mg once daily for 21 days.                           Inpatient GI service signing off-call as needed. Procedure Code(s):        --- Professional ---                           [phone], Esophagogastroduodenoscopy, flexible,                            transoral; diagnostic, including collection of                            specimen(s) by brushing or washing, when performed                            (separate procedure) Diagnosis Code(s):        --- Professional ---  K22.9, Disease of esophagus, unspecified                           K31.819, Angiodysplasia of stomach and duodenum                            without bleeding                           K76.6, Portal hypertension                           K31.89, Other diseases of stomach and duodenum                           K74.60, Unspecified cirrhosis of liver                           R93.3, Abnormal findings on diagnostic imaging of                            other parts of digestive tract CPT copyright 2022 American [employer] Association. All rights reserved. The codes documented in this report are preliminary and upon coder review may  be revised to meet current compliance requirements. Lew Prout L. Myrtie Neither, MD 09/25/2023 10:50:35 AM This report has been signed electronically. Number of Addenda: 0

## 2023-09-25 NOTE — Interval H&P Note (Signed)
History and Physical Interval Note:  09/25/2023 9:30 AM  Tony Cantu  has presented today for surgery, with the diagnosis of Cirrhosis, rule out esophageal varices, recent dark stool-needs TEE at same setting.  The various methods of treatment have been discussed with the patient and family. After consideration of risks, benefits and other options for treatment, the patient has consented to  Procedure(s) with comments: ESOPHAGOGASTRODUODENOSCOPY (EGD) WITH PROPOFOL (N/A) - EGD needs to be coordinated with TEE.  Patient has severe aortic stenosis, procedures need to be done to follow each other with 1 sedation TRANSESOPHAGEAL ECHOCARDIOGRAM (TEE) (N/A) as a surgical intervention.  The patient's history has been reviewed, patient examined, no change in status, stable for surgery.  I have reviewed the patient's chart and labs.  Questions were answered to the patient's satisfaction.    Signout received from Dr. Chales Abrahams and chart review performed. Platelets 93,000, INR 1.1 Upper endoscopy prior to TEE due to history of cirrhosis and portal hypertensive changes on CT abdomen and pelvis.  No varices seen on February 2024 upper endoscopy.  Charlie Pitter III

## 2023-09-25 NOTE — Progress Notes (Signed)
Physical Therapy Treatment Patient Details Name: Tony Cantu MRN: [id number] DOB: 11/27/1950 Today's Date: 09/25/2023   History of Present Illness 73 y.o. male presents to [facility] hospital on 09/23/2023 as a transfer from [facility] with admitted for sepsis 2/2 Streptococcus bacteremia and acute metabolic encephalopathy. Pt to undergo TEE and EGD on 1/27. PMH includes DM, afib, cirrhosis, seizure disorder, aortic stenosis, HTN, OA, polyclonal gammopathy, OSA.    PT Comments  Pt is limited by fatigue during session, declining attempts at ambulation after transferring from bed to recliner. Pt continues to require physical assistance for bed mobility and transfers. Pt remains at a high risk for falls due to generalized weakness. Patient will benefit from continued inpatient follow up therapy, <3 hours/day.    If plan is discharge home, recommend the following: A lot of help with bathing/dressing/bathroom;A lot of help with walking and/or transfers;Assistance with cooking/housework;Help with stairs or ramp for entrance   Can travel by private vehicle     No  Equipment Recommendations  Wheelchair (measurements PT) (if discharging home)    Recommendations for Other Services       Precautions / Restrictions Precautions Precautions: Fall Restrictions Weight Bearing Restrictions Per Provider Order: No     Mobility  Bed Mobility Overal bed mobility: Needs Assistance Bed Mobility: Supine to Sit     Supine to sit: Mod assist, HOB elevated, Used rails          Transfers Overall transfer level: Needs assistance Equipment used: Rolling walker (2 wheels) Transfers: Sit to/from Stand, Bed to chair/wheelchair/BSC Sit to Stand: Mod assist   Step pivot transfers: Min assist       General transfer comment: pt requires modA along with verbal cues for hand placement and increased trunk flexion to successfully stand. Once standing minA for balance during step and pivot to recliner     Ambulation/Gait Ambulation/Gait assistance:  (pt declines attempts at ambulation after transferring to recliner)                 Stairs             Wheelchair Mobility     Tilt Bed    Modified Rankin (Stroke Patients Only)       Balance Overall balance assessment: Needs assistance Sitting-balance support: Single extremity supported, Bilateral upper extremity supported, Feet supported Sitting balance-Leahy Scale: Poor Sitting balance - Comments: posterior lean, minA   Standing balance support: Bilateral upper extremity supported Standing balance-Leahy Scale: Poor Standing balance comment: minA                            Cognition Arousal: Alert Behavior During Therapy: WFL for tasks assessed/performed Overall Cognitive Status: Impaired/Different from baseline Area of Impairment: Problem solving                             Problem Solving: Slow processing          Exercises General Exercises - Lower Extremity Ankle Circles/Pumps: AROM, Both, 10 reps Long Arc Quad: AROM, Both, 5 reps Hip Flexion/Marching: AROM, Both, 5 reps    General Comments General comments (skin integrity, edema, etc.): pt weaned to room air from 3L [place], sats stable in 90s during session at 93% at end of session. PT returns pt to 3L [place] at end of session, makes RN aware      Pertinent Vitals/Pain Pain Assessment Pain Assessment: No/denies pain  Home Living                          Prior Function            PT Goals (current goals can now be found in the care plan section) Acute Rehab PT Goals Patient Stated Goal: To return home Progress towards PT goals: Progressing toward goals    Frequency    Min 3X/week      PT Plan      Co-evaluation              AM-PAC PT "6 Clicks" Mobility   Outcome Measure  Help needed turning from your back to your side while in a flat bed without using bedrails?: A Lot Help needed moving  from lying on your back to sitting on the side of a flat bed without using bedrails?: A Lot Help needed moving to and from a bed to a chair (including a wheelchair)?: A Lot Help needed standing up from a chair using your arms (e.g., wheelchair or bedside chair)?: A Lot Help needed to walk in hospital room?: Total Help needed climbing 3-5 steps with a railing? : Total 6 Click Score: 10    End of Session Equipment Utilized During Treatment: Gait belt Activity Tolerance: Patient tolerated treatment well Patient left: in chair;with call bell/phone within reach;with chair alarm set Nurse Communication: Mobility status PT Visit Diagnosis: Unsteadiness on feet (R26.81);Other abnormalities of gait and mobility (R26.89);Muscle weakness (generalized) (M62.81)     Time: 1433-1510 PT Time Calculation (min) (ACUTE ONLY): 37 min  Charges:    $Therapeutic Exercise: 8-22 mins $Therapeutic Activity: 8-22 mins PT General Charges $$ ACUTE PT VISIT: 1 Visit                     Arlyss Gandy, PT, DPT Acute Rehabilitation Office [phone]    Arlyss Gandy 09/25/2023, 3:24 PM

## 2023-09-25 NOTE — H&P (View-Only) (Signed)
Rounding Note    Patient Name: Tony Cantu Date of Encounter: 09/25/2023  [facility] Cardiologist: Rollene Rotunda, MD   Subjective   Denies CP or dyspnea  Inpatient Medications    Scheduled Meds:  Chlorhexidine Gluconate Cloth  6 each Topical Daily   feeding supplement  237 mL Oral BID BM   insulin aspart  0-9 Units Subcutaneous Q4H   insulin glargine-yfgn  10 Units Subcutaneous Daily   pantoprazole (PROTONIX) IV  40 mg Intravenous Q12H   sodium chloride flush  3 mL Intravenous Q12H   sodium chloride flush  3-10 mL Intravenous Q12H   Continuous Infusions:  cefTRIAXone (ROCEPHIN)  IV 2 g (09/24/23 2114)   PRN Meds: acetaminophen, albuterol, sodium chloride flush   Vital Signs    Vitals:   09/24/23 2040 09/24/23 2350 09/25/23 0355 09/25/23 0817  BP: (!) 96/58 (!) 96/54 (!) 101/57 (!) 95/57  Pulse:   62 70  Resp: 17 16 18 17   Temp: 98.7 F (37.1 C) 98.9 F (37.2 C) 98.1 F (36.7 C) (!) 97.4 F (36.3 C)  TempSrc: Oral Axillary Axillary Oral  SpO2: 97%  97% 99%  Weight:      Height:        Intake/Output Summary (Last 24 hours) at 09/25/2023 0847 Last data filed at 09/25/2023 0817 Gross per 24 hour  Intake 963 ml  Output 2575 ml  Net -1612 ml      09/20/2023    2:04 PM 09/19/2023    5:09 PM 09/13/2023   10:42 AM  Last 3 Weights  Weight (lbs) 203 lb 11.3 oz 209 lb 7 oz 209 lb 12.8 oz  Weight (kg) 92.4 kg 95 kg 95.165 kg      Telemetry    Sinus with PVCs - Personally Reviewed  Physical Exam   GEN: NAD Neck: supple Cardiac: RRR, 3/6 systolic murmur, no rub Respiratory: CTA GI: Soft, NT/ND MS: No edema Neuro:  grossly intact Psych: Normal affect   Labs    High Sensitivity Troponin:   Recent Labs  Lab 09/19/23 1848 09/19/23 2045  TROPONINIHS 28* 35*     Chemistry Recent Labs  Lab 09/22/23 0512 09/23/23 0427 09/24/23 0356  NA 138 136 136  K 3.7 3.6 3.3*  CL 108 108 106  CO2 23 22 22   GLUCOSE 262* 235* 170*  BUN 17 17  13   CREATININE 0.88 0.77 0.81  CALCIUM 8.5* 8.3* 8.4*  MG 1.8 1.9 1.7  PROT 5.4* 5.2* 5.5*  ALBUMIN 2.4* 2.3* 2.1*  AST 143* 128* 92*  ALT 78* 82* 75*  ALKPHOS 66 73 77  BILITOT 0.7 0.5 0.6  GFRNONAA >60 >60 >60  ANIONGAP 7 6 8      Hematology Recent Labs  Lab 09/22/23 0512 09/23/23 0427 09/24/23 0356  WBC 4.4 4.1 4.8  RBC 2.71* 2.68* 2.69*  HGB 9.5* 9.5* 9.6*  HCT 30.1* 29.2* 28.0*  MCV 111.1* 109.0* 104.1*  MCH 35.1* 35.4* 35.7*  MCHC 31.6 32.5 34.3  RDW 17.7* 16.6* 16.3*  PLT 81* 87* 93*      Patient Profile     73 y.o. male with past medical history of paroxysmal atrial fibrillation, aortic stenosis, hypertension, hyperlipidemia, diabetes mellitus, cirrhosis with gastric varices, gastric ulcers, peptic ulcer disease, obstructive sleep apnea admitted with febrile illness and found to be bacteremic.  Also with severe aortic stenosis.  Transferred from [facility] to [facility] for EGD and TEE.  Echocardiogram January 2025 showed normal LV function,  mild to moderate left ventricular enlargement, grade 1 diastolic dysfunction, mild mitral stenosis, severe aortic stenosis with mean gradient 46.3 mmHg.  Assessment & Plan    1 strep bacteremia-continue Rocephin.  Patient was transferred from [facility] for transesophageal echocardiogram to rule out endocarditis.  He also will require EGD given anemia/melena.  The two have been coordinated together.  2 aortic stenosis-severe on recent echocardiogram.  Patient is very inactive; however he denies dyspnea, chest pain or syncope.  Will need further evaluation for possible TAVR as an outpatient.  3 paroxysmal atrial fibrillation-patient remains in sinus rhythm.  Given admission with anemia and planned EGD anticoagulation is on hold.  Cardizem is also on hold as blood pressure has been borderline.  4 metabolic encephalopathy-resolved.  5 history of thrombocytopenia-felt secondary to cirrhosis and possible sepsis.  Most  recent platelet count 93,000.  6 anemia/melena-for EGD today.  7 history of cirrhosis-Per gastroenterology and primary service.  For questions or updates, please contact [facility] Please consult [email or web address] for contact info under        Signed, Olga Millers, MD  09/25/2023, 8:47 AM

## 2023-09-25 NOTE — Anesthesia Preprocedure Evaluation (Addendum)
Anesthesia Evaluation  Patient identified by MRN, date of birth, ID band Patient awake    Reviewed: Allergy & Precautions, NPO status , Patient's Chart, lab work & pertinent test results  Airway Mallampati: III  TM Distance: >3 FB Neck ROM: Full  Mouth opening: Limited Mouth Opening  Dental  (+) Poor Dentition, Chipped, Dental Advisory Given   Pulmonary asthma , sleep apnea and Continuous Positive Airway Pressure Ventilation , COPD, former smoker   Pulmonary exam normal breath sounds clear to auscultation       Cardiovascular hypertension, Normal cardiovascular exam+ dysrhythmias Atrial Fibrillation + Valvular Problems/Murmurs AS  Rhythm:Regular Rate:Normal  Echocardiogram January 2025 showed normal LV function, mild to moderate left ventricular enlargement, grade 1 diastolic dysfunction, mild mitral stenosis, severe aortic stenosis with mean gradient 46.3 mmHg.   Neuro/Psych  Headaches, Seizures -, Well Controlled,   negative psych ROS   GI/Hepatic PUD,GERD  ,,(+) Cirrhosis         Endo/Other  diabetes, Type 2, Insulin Dependent    Renal/GU Renal disease  negative genitourinary   Musculoskeletal  (+) Arthritis ,    Abdominal   Peds  Hematology  (+) Blood dyscrasia (eliquis), anemia Lab Results      Component                Value               Date                      WBC                      4.8                 09/24/2023                HGB                      9.6 (L)             09/24/2023                HCT                      28.0 (L)            09/24/2023                MCV                      104.1 (H)           09/24/2023                PLT                      93 (L)              09/24/2023              Anesthesia Other Findings 73 y.o. male with past medical history of paroxysmal atrial fibrillation, aortic stenosis, hypertension, hyperlipidemia, diabetes mellitus, cirrhosis with gastric varices, gastric  ulcers, peptic ulcer disease, obstructive sleep apnea admitted with febrile illness and found to be bacteremic  Reproductive/Obstetrics                             Anesthesia Physical Anesthesia Plan  ASA: 4  Anesthesia Plan: MAC   Post-op Pain Management:    Induction: Intravenous  PONV Risk Score and Plan: Propofol infusion and Treatment may vary due to age or medical condition  Airway Management Planned: Natural Airway  Additional Equipment:   Intra-op Plan:   Post-operative Plan:   Informed Consent: I have reviewed the patients History and Physical, chart, labs and discussed the procedure including the risks, benefits and alternatives for the proposed anesthesia with the patient or authorized representative who has indicated his/her understanding and acceptance.     Dental advisory given  Plan Discussed with: CRNA  Anesthesia Plan Comments:        Anesthesia Quick Evaluation

## 2023-09-25 NOTE — Progress Notes (Signed)
Progress Note    Tony Cantu  ZOX:[id number] DOB: 08-01-1951  DOA: 09/19/2023 PCP: [name], DO      Brief Narrative:    Medical records reviewed and are as summarized below:  Tony Cantu is a 73 y.o. male with hx of type 1 diabetes, A-fib on Eliquis, cirrhosis, seizure disorder, aortic stenosis, hypertension, peptic ulcer disease, OA with bilateral knee replacement, polyclonal gammopathy, OSA on CPAP, who presented to the hospital because of general weakness, confusion and fever.   He was admitted to the hospital for sepsis secondary to Streptococcus bacteremia and acute metabolic encephalopathy.  He was also found to have esophageal varices on CT abdomen and pelvis.  TEE and EGD were recommended.  However, because of underlying severe aortic stenosis, he was deemed not to be a good candidate for sedation at  [facility].  It was recommended that patient be transferred to [facility] to have TEE and EGD concurrently.      Assessment/Plan:   Principal Problem:   Sepsis (HCC) Active Problems:   Stroke-like symptom   Acute encephalopathy   Transaminitis   Renal lesion   Melena   Bacteremia   Other cirrhosis of liver (HCC)   Abnormal finding on GI tract imaging   Esophageal candidiasis (HCC)    Body mass index is 30.97 kg/m.   Sepsis secondary Streptococcus salivarius bacteremia: Continue IV ceftriaxone.  No vegetations on 2D echo nor on TEE.   Final antibiotic plan per ID who is following  Acute metabolic encephalopathy: resolved  Right-sided weakness: No acute stroke on MRI brain. PT recommended discharge to SNF, patient agreeable, TOC working on that  Melena, anemia: hgb has improved spontaneously and is at baseline, egd no signs bleeding  Esophageal candidiasis: seen on EGD, will start fluconazole 21 days per gi rec   Type I DM: glucose elevated. Will increase semglee from 10 to 15, add 2 units mealtime, and adjust sliding  scale  Paroxysmal atrial fibrillation: resume apixaban given stable hgb and negative egd  Severe aortic stenosis: 2D echo on 09/20/2023 showed EF estimated at 55 to 60%, severe aortic stenosis, grade 1 diastolic dysfunction. Cardiology following   Liver cirrhosis, chronic thrombocytopenia: Compensated.  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 No varices on EGD today LfTs mild and have been down-trending  OSA: has inspire device         Diet Order             Diet Heart Room service appropriate? Yes; Fluid consistency: Thin  Diet effective now                            Consultants: Cardiologist ID specialist Gastroenterologist  Procedures: None    Medications:    Chlorhexidine Gluconate Cloth  6 each Topical Daily   feeding supplement  237 mL Oral BID BM   fluconazole  100 mg Oral Daily   insulin aspart  0-9 Units Subcutaneous Q4H   insulin glargine-yfgn  10 Units Subcutaneous Daily   pantoprazole (PROTONIX) IV  40 mg Intravenous Q12H   sodium chloride flush  3 mL Intravenous Q12H   Continuous Infusions:  cefTRIAXone (ROCEPHIN)  IV 2 g (09/24/23 2114)     Anti-infectives (From admission, onward)    Start     Dose/Rate Route Frequency Ordered Stop   09/25/23 1330  fluconazole (DIFLUCAN) tablet 100 mg  100 mg Oral Daily 09/25/23 1237 10/16/23 0959   09/20/23 2200  vancomycin (VANCOREADY) IVPB 1500 mg/300 mL  Status:  Discontinued        1,500 mg 150 mL/hr over 120 Minutes Intravenous Every 24 hours 09/20/23 1020 09/20/23 1529   09/20/23 2200  cefTRIAXone (ROCEPHIN) 2 g in sodium chloride 0.9 % 100 mL IVPB        2 g 200 mL/hr over 30 Minutes Intravenous Every 24 hours 09/20/23 1529     09/20/23 0600  ceFEPIme (MAXIPIME) 2 g in sodium chloride 0.9 % 100 mL IVPB  Status:  Discontinued        2 g 200 mL/hr over 30 Minutes Intravenous Every 8 hours 09/20/23 0038 09/20/23 1529   09/20/23 0045  metroNIDAZOLE (FLAGYL) IVPB 500  mg  Status:  Discontinued        500 mg 100 mL/hr over 60 Minutes Intravenous Every 12 hours 09/20/23 0033 09/20/23 1529   09/19/23 2200  vancomycin (VANCOREADY) IVPB 2000 mg/400 mL        2,000 mg 200 mL/hr over 120 Minutes Intravenous  Once 09/19/23 2146 09/20/23 0103   09/19/23 2145  ceFEPIme (MAXIPIME) 2 g in sodium chloride 0.9 % 100 mL IVPB        2 g 200 mL/hr over 30 Minutes Intravenous  Once 09/19/23 2143 09/19/23 2247              Family Communication/Anticipated D/C date and plan/Code Status   DVT prophylaxis: Place and maintain sequential compression device Start: 09/20/23 0522     Code Status: Full Code  Family Communication: None Disposition Plan: Plan to discharge to SNF   Status is: Inpatient Remains inpatient appropriate because: pending ID finalizing plan for abx, snf placement       Subjective:   Tolerated procedure, denies melena/bleeding, eating supper now, no pain  Objective:    Vitals:   09/25/23 1116 09/25/23 1120 09/25/23 1210 09/25/23 1617  BP: 111/63 (!) 98/56 129/65 (!) 101/55  Pulse: 87 82 63 74  Resp: 16 16 16 19   Temp:   97.7 F (36.5 C) 98.1 F (36.7 C)  TempSrc:   Oral Oral  SpO2: 95% 95% 99% 99%  Weight:      Height:       No data found.   Intake/Output Summary (Last 24 hours) at 09/25/2023 1640 Last data filed at 09/25/2023 1400 Gross per 24 hour  Intake 643 ml  Output 2575 ml  Net -1932 ml   Filed Weights   09/19/23 1709 09/20/23 1404  Weight: 95 kg 92.4 kg    Exam:  GEN: NAD SKIN: Warm and dry EYES: No pallor or icterus ENT: MMM CV: RRR PULM: CTA B ABD: soft, ND, NT, +BS CNS: AAO x 3, non focal EXT: warm, trace edema        Data Reviewed:   I have personally reviewed following labs and imaging studies:  Labs: Labs show the following:   Basic Metabolic Panel: Recent Labs  Lab 09/20/23 0258 09/21/23 0507 09/22/23 0512 09/23/23 0427 09/24/23 0356 09/25/23 1217  NA 141 143 138 136  136 138  K 4.3 3.6 3.7 3.6 3.3* 4.3  CL 111 115* 108 108 106 107  CO2 20* 23 23 22 22  17*  GLUCOSE 266* 225* 262* 235* 170* 237*  BUN 15 18 17 17 13 10   CREATININE 1.13 0.95 0.88 0.77 0.81 0.97  CALCIUM 8.8* 8.8* 8.5* 8.3* 8.4* 8.9  MG 1.6* 2.1  1.8 1.9 1.7  --   PHOS 3.2  --   --   --   --   --    GFR Estimated Creatinine Clearance: 74.8 mL/min (by C-G formula based on SCr of 0.97 mg/dL). Liver Function Tests: Recent Labs  Lab 09/21/23 0507 09/22/23 0512 09/23/23 0427 09/24/23 0356 09/25/23 1217  AST 164* 143* 128* 92* 77*  ALT 80* 78* 82* 75* 71*  ALKPHOS 70 66 73 77 85  BILITOT 0.7 0.7 0.5 0.6 1.0  PROT 5.6* 5.4* 5.2* 5.5* 6.1*  ALBUMIN 2.5* 2.4* 2.3* 2.1* 2.3*   No results for input(s): "LIPASE", "AMYLASE" in the last 168 hours. Recent Labs  Lab 09/20/23 0532  AMMONIA 13   Coagulation profile Recent Labs  Lab 09/20/23 1321 09/22/23 1250  INR 1.4* 1.1    CBC: Recent Labs  Lab 09/19/23 1848 09/20/23 0258 09/21/23 0507 09/22/23 0512 09/23/23 0427 09/24/23 0356 09/25/23 1318  WBC 8.9   < > 6.4 4.4 4.1 4.8 7.6  NEUTROABS 7.8*  --   --   --   --   --   --   HGB 10.2*   < > 9.8* 9.5* 9.5* 9.6* 11.0*  HCT 31.4*   < > 30.9* 30.1* 29.2* 28.0* 32.9*  MCV 111.7*   < > 112.4* 111.1* 109.0* 104.1* 107.9*  PLT 126*   < > 87* 81* 87* 93* 104*   < > = values in this interval not displayed.   Cardiac Enzymes: No results for input(s): "CKTOTAL", "CKMB", "CKMBINDEX", "TROPONINI" in the last 168 hours. BNP (last 3 results) No results for input(s): "PROBNP" in the last 8760 hours. CBG: Recent Labs  Lab 09/25/23 0733 09/25/23 1017 09/25/23 1139 09/25/23 1218 09/25/23 1602  GLUCAP 202* 193* 214* 244* 383*   D-Dimer: No results for input(s): "DDIMER" in the last 72 hours. Hgb A1c: No results for input(s): "HGBA1C" in the last 72 hours. Lipid Profile: No results for input(s): "CHOL", "HDL", "LDLCALC", "TRIG", "CHOLHDL", "LDLDIRECT" in the last 72  hours. Thyroid function studies: No results for input(s): "TSH", "T4TOTAL", "T3FREE", "THYROIDAB" in the last 72 hours.  Invalid input(s): "FREET3" Anemia work up: No results for input(s): "VITAMINB12", "FOLATE", "FERRITIN", "TIBC", "IRON", "RETICCTPCT" in the last 72 hours. Sepsis Labs: Recent Labs  Lab 09/19/23 1848 09/19/23 2045 09/20/23 0258 09/22/23 0512 09/23/23 0427 09/24/23 0356 09/25/23 1318  WBC 8.9  --    < > 4.4 4.1 4.8 7.6  LATICACIDVEN 2.3* 1.8  --   --   --   --   --    < > = values in this interval not displayed.    Microbiology Recent Results (from the past 240 hours)  Blood culture (routine x 2)     Status: Abnormal   Collection Time: 09/19/23  6:48 PM   Specimen: BLOOD LEFT HAND  Result Value Ref Range Status   Specimen Description   Final    BLOOD LEFT HAND Performed at [facility], [address] [place], [place] [address]    Special Requests   Final    BOTTLES DRAWN AEROBIC ONLY Blood Culture results may not be optimal due to an inadequate volume of blood received in culture bottles Performed at [facility], [address] [place], [place] [address]    Culture  Setup Time   Final    GRAM POSITIVE COCCI AEROBIC BOTTLE ONLY Gram Stain Report Called to,Read Back By and Verified With: KRISTY EDWARDS AT 0859 09/20/2023 BY A. SNYDER  GRAM STAIN REVIEWED-AGREE WITH RESULT DRT    Culture (A)  Final    STREPTOCOCCUS SALIVARIUS SUSCEPTIBILITIES PERFORMED ON PREVIOUS CULTURE WITHIN THE LAST 5 DAYS. Performed at [facility], [address] [place], [place] [address]    Report Status 09/22/2023 FINAL  Final  Blood culture (routine x 2)     Status: Abnormal   Collection Time: 09/19/23  6:48 PM   Specimen: BLOOD LEFT HAND  Result Value Ref Range Status   Specimen Description   Final    BLOOD LEFT HAND Performed at [facility], [address] [place], [place] [address]    Special Requests   Final    BOTTLES DRAWN AEROBIC ONLY  Blood Culture results may not be optimal due to an inadequate volume of blood received in culture bottles Performed at [facility], [address], [place], [place] [address]    Culture  Setup Time   Final    GRAM POSITIVE COCCI AEROBIC BOTTLE ONLY Gram Stain Report Called to,Read Back By and Verified With: KRISTY EDWARDS AT 0859 09/20/2023 BY A. SNYDER GRAM STAIN REVIEWED-AGREE WITH RESULT DRT CRITICAL RESULT CALLED TO, READ BACK BY AND VERIFIED WITH: PHARMD F.WILSON [id number] @ 1518 FH Performed at [facility], [address], [place], [place] [address]    Culture STREPTOCOCCUS SALIVARIUS (A)  Final   Report Status 09/22/2023 FINAL  Final   Organism ID, Bacteria STREPTOCOCCUS SALIVARIUS  Final      Susceptibility   Streptococcus salivarius - MIC*    PENICILLIN 0.25 INTERMEDIATE Intermediate     CEFTRIAXONE <=0.12 SENSITIVE Sensitive     ERYTHROMYCIN 2 RESISTANT Resistant     LEVOFLOXACIN 2 SENSITIVE Sensitive     VANCOMYCIN 1 SENSITIVE Sensitive     * STREPTOCOCCUS SALIVARIUS  Blood Culture ID Panel (Reflexed)     Status: Abnormal   Collection Time: 09/19/23  6:48 PM  Result Value Ref Range Status   Enterococcus faecalis NOT DETECTED NOT DETECTED Final   Enterococcus Faecium NOT DETECTED NOT DETECTED Final   Listeria monocytogenes NOT DETECTED NOT DETECTED Final   Staphylococcus species NOT DETECTED NOT DETECTED Final   Staphylococcus aureus (BCID) NOT DETECTED NOT DETECTED Final   Staphylococcus epidermidis NOT DETECTED NOT DETECTED Final   Staphylococcus lugdunensis NOT DETECTED NOT DETECTED Final   Streptococcus species DETECTED (A) NOT DETECTED Final    Comment: Not Enterococcus species, Streptococcus agalactiae, Streptococcus pyogenes, or Streptococcus pneumoniae. CRITICAL RESULT CALLED TO, READ BACK BY AND VERIFIED WITH: PHARMD F.WILSON [id number] @ 1518 FH    Streptococcus agalactiae NOT DETECTED NOT DETECTED Final   Streptococcus pneumoniae NOT DETECTED NOT DETECTED  Final   Streptococcus pyogenes NOT DETECTED NOT DETECTED Final   A.calcoaceticus-baumannii NOT DETECTED NOT DETECTED Final   Bacteroides fragilis NOT DETECTED NOT DETECTED Final   Enterobacterales NOT DETECTED NOT DETECTED Final   Enterobacter cloacae complex NOT DETECTED NOT DETECTED Final   Escherichia coli NOT DETECTED NOT DETECTED Final   Klebsiella aerogenes NOT DETECTED NOT DETECTED Final   Klebsiella oxytoca NOT DETECTED NOT DETECTED Final   Klebsiella pneumoniae NOT DETECTED NOT DETECTED Final   Proteus species NOT DETECTED NOT DETECTED Final   Salmonella species NOT DETECTED NOT DETECTED Final   Serratia marcescens NOT DETECTED NOT DETECTED Final   Haemophilus influenzae NOT DETECTED NOT DETECTED Final   Neisseria meningitidis NOT DETECTED NOT DETECTED Final   Pseudomonas aeruginosa NOT DETECTED NOT DETECTED Final   Stenotrophomonas maltophilia NOT DETECTED NOT DETECTED  Final   Candida albicans NOT DETECTED NOT DETECTED Final   Candida auris NOT DETECTED NOT DETECTED Final   Candida glabrata NOT DETECTED NOT DETECTED Final   Candida krusei NOT DETECTED NOT DETECTED Final   Candida parapsilosis NOT DETECTED NOT DETECTED Final   Candida tropicalis NOT DETECTED NOT DETECTED Final   Cryptococcus neoformans/gattii NOT DETECTED NOT DETECTED Final    Comment: Performed at [facility], [address], [place], [place] [address]  Resp panel by RT-PCR (RSV, Flu A&B, Covid) Anterior Nasal Swab     Status: None   Collection Time: 09/19/23  7:30 PM   Specimen: Anterior Nasal Swab  Result Value Ref Range Status   SARS Coronavirus 2 by RT PCR NEGATIVE NEGATIVE Final    Comment: (NOTE) SARS-CoV-2 target nucleic acids are NOT DETECTED.  The SARS-CoV-2 RNA is generally detectable in upper respiratory specimens during the acute phase of infection. The lowest concentration of SARS-CoV-2 viral copies this assay can detect is 138 copies/mL. A negative result does not preclude  SARS-Cov-2 infection and should not be used as the sole basis for treatment or other patient management decisions. A negative result may occur with  improper specimen collection/handling, submission of specimen other than nasopharyngeal swab, presence of viral mutation(s) within the areas targeted by this assay, and inadequate number of viral copies(<138 copies/mL). A negative result must be combined with clinical observations, patient history, and epidemiological information. The expected result is Negative.  Fact Sheet for Patients:  BloggerCourse.com  Fact Sheet for Healthcare Providers:  SeriousBroker.it  This test is no t yet approved or cleared by the [place] FDA and  has been authorized for detection and/or diagnosis of SARS-CoV-2 by FDA under an Emergency Use Authorization (EUA). This EUA will remain  in effect (meaning this test can be used) for the duration of the COVID-19 declaration under Section 564(b)(1) of the Act, 21 U.S.C.section 360bbb-3(b)(1), unless the authorization is terminated  or revoked sooner.       Influenza A by PCR NEGATIVE NEGATIVE Final   Influenza B by PCR NEGATIVE NEGATIVE Final    Comment: (NOTE) The Xpert Xpress SARS-CoV-2/FLU/RSV plus assay is intended as an aid in the diagnosis of influenza from Nasopharyngeal swab specimens and should not be used as a sole basis for treatment. Nasal washings and aspirates are unacceptable for Xpert Xpress SARS-CoV-2/FLU/RSV testing.  Fact Sheet for Patients: BloggerCourse.com  Fact Sheet for Healthcare Providers: SeriousBroker.it  This test is not yet approved or cleared by the [place] FDA and has been authorized for detection and/or diagnosis of SARS-CoV-2 by FDA under an Emergency Use Authorization (EUA). This EUA will remain in effect (meaning this test can be used) for the duration of  the COVID-19 declaration under Section 564(b)(1) of the Act, 21 U.S.C. section 360bbb-3(b)(1), unless the authorization is terminated or revoked.     Resp Syncytial Virus by PCR NEGATIVE NEGATIVE Final    Comment: (NOTE) Fact Sheet for Patients: BloggerCourse.com  Fact Sheet for Healthcare Providers: SeriousBroker.it  This test is not yet approved or cleared by the [place] FDA and has been authorized for detection and/or diagnosis of SARS-CoV-2 by FDA under an Emergency Use Authorization (EUA). This EUA will remain in effect (meaning this test can be used) for the duration of the COVID-19 declaration under Section 564(b)(1) of the Act, 21 U.S.C. section 360bbb-3(b)(1), unless the authorization is terminated or revoked.  Performed at [facility], [address], [place], [place] [address]  Respiratory (~20 pathogens) panel by PCR     Status: None   Collection Time: 09/20/23  7:20 AM   Specimen: Nasopharyngeal Swab; Respiratory  Result Value Ref Range Status   Adenovirus NOT DETECTED NOT DETECTED Final   Coronavirus 229E NOT DETECTED NOT DETECTED Final    Comment: (NOTE) The Coronavirus on the Respiratory Panel, DOES NOT test for the novel  Coronavirus (2019 nCoV)    Coronavirus HKU1 NOT DETECTED NOT DETECTED Final   Coronavirus NL63 NOT DETECTED NOT DETECTED Final   Coronavirus OC43 NOT DETECTED NOT DETECTED Final   Metapneumovirus NOT DETECTED NOT DETECTED Final   Rhinovirus / Enterovirus NOT DETECTED NOT DETECTED Final   Influenza A NOT DETECTED NOT DETECTED Final   Influenza B NOT DETECTED NOT DETECTED Final   Parainfluenza Virus 1 NOT DETECTED NOT DETECTED Final   Parainfluenza Virus 2 NOT DETECTED NOT DETECTED Final   Parainfluenza Virus 3 NOT DETECTED NOT DETECTED Final   Parainfluenza Virus 4 NOT DETECTED NOT DETECTED Final   Respiratory Syncytial Virus NOT DETECTED NOT DETECTED Final   Bordetella pertussis  NOT DETECTED NOT DETECTED Final   Bordetella Parapertussis NOT DETECTED NOT DETECTED Final   Chlamydophila pneumoniae NOT DETECTED NOT DETECTED Final   Mycoplasma pneumoniae NOT DETECTED NOT DETECTED Final    Comment: Performed at [facility], [address] [place], [place] [address]  MRSA Next Gen by PCR, Nasal     Status: None   Collection Time: 09/20/23 12:35 PM   Specimen: Nasal Mucosa; Nasal Swab  Result Value Ref Range Status   MRSA by PCR Next Gen NOT DETECTED NOT DETECTED Final    Comment: (NOTE) The GeneXpert MRSA Assay (FDA approved for NASAL specimens only), is one component of a comprehensive MRSA colonization surveillance program. It is not intended to diagnose MRSA infection nor to guide or monitor treatment for MRSA infections. Test performance is not FDA approved in patients less than 103 years old. Performed at [facility], [address], [place], [place] [address]   Culture, blood (Routine X 2) w Reflex to ID Panel     Status: None   Collection Time: 09/20/23  4:55 PM   Specimen: BLOOD  Result Value Ref Range Status   Specimen Description BLOOD BLOOD LEFT ARM  Final   Special Requests   Final    BOTTLES DRAWN AEROBIC AND ANAEROBIC Blood Culture adequate volume   Culture   Final    NO GROWTH 5 DAYS Performed at [facility], [address] [place], [place] [address]    Report Status 09/25/2023 FINAL  Final  Culture, blood (Routine X 2) w Reflex to ID Panel     Status: None   Collection Time: 09/20/23  4:55 PM   Specimen: BLOOD  Result Value Ref Range Status   Specimen Description BLOOD BLOOD RIGHT HAND  Final   Special Requests   Final    BOTTLES DRAWN AEROBIC AND ANAEROBIC Blood Culture adequate volume   Culture   Final    NO GROWTH 5 DAYS Performed at [facility], [address], [place], [place] [address]    Report Status 09/25/2023 FINAL  Final    Procedures and diagnostic studies:  No results found.             LOS: 5 days    Silvano Bilis  [employer] loss adjuster on [email or web address]. If 7PM-7AM, please contact night-coverage at [email or web address]     09/25/2023, 4:40 PM

## 2023-09-25 NOTE — Interval H&P Note (Signed)
History and Physical Interval Note:  09/25/2023 11:12 AM  Tony Cantu  has presented today for surgery, with the diagnosis of Cirrhosis, rule out esophageal varices, recent dark stool-needs TEE at same setting.  The various methods of treatment have been discussed with the patient and family. After consideration of risks, benefits and other options for treatment, the patient has consented to  Procedure(s) with comments: ESOPHAGOGASTRODUODENOSCOPY (EGD) WITH PROPOFOL (N/A) - EGD needs to be coordinated with TEE.  Patient has severe aortic stenosis, procedures need to be done to follow each other with 1 sedation TRANSESOPHAGEAL ECHOCARDIOGRAM (TEE) (N/A) as a surgical intervention.  The patient's history has been reviewed, patient examined, no change in status, stable for surgery.  I have reviewed the patient's chart and labs.  Questions were answered to the patient's satisfaction.     [name]

## 2023-09-25 NOTE — Transfer of Care (Signed)
Immediate Anesthesia Transfer of Care Note  Patient: Tony Cantu  Procedure(s) Performed: ESOPHAGOGASTRODUODENOSCOPY (EGD) WITH PROPOFOL TRANSESOPHAGEAL ECHOCARDIOGRAM (TEE)  Patient Location: PACU  Anesthesia Type:MAC  Level of Consciousness: awake, alert , and oriented  Airway & Oxygen Therapy: Patient Spontanous Breathing  Post-op Assessment: Report given to RN  Post vital signs: Reviewed and stable  Last Vitals:  Vitals Value Taken Time  BP 111/63 09/25/23 1116  Temp    Pulse 82 09/25/23 1119  Resp 16 09/25/23 1119  SpO2 95 % 09/25/23 1119  Vitals shown include unfiled device data.  Last Pain:  Vitals:   09/25/23 1119  TempSrc:   PainSc: (P) 0-No pain      Patients Stated Pain Goal: 0 (09/21/23 0800)  Complications: No notable events documented.

## 2023-09-25 NOTE — Progress Notes (Signed)
   [name] has been requested to perform a transesophageal echocardiogram on Tony Cantu for bacteremia.  After careful review of history and examination, the risks and benefits of transesophageal echocardiogram have been explained including risks of esophageal damage, perforation (1:10,000 risk), bleeding, pharyngeal hematoma as well as other potential complications associated with conscious sedation including aspiration, arrhythmia, respiratory failure and death. Alternatives to treatment were discussed, questions were answered. Patient is willing to proceed.   Previous history of varices so GI will scope him first. To be performed around 9:45am today in the endo lab.   Abagail Kitchens, PA-C  09/25/2023 7:18 AM

## 2023-09-26 ENCOUNTER — Other Ambulatory Visit: Payer: Self-pay

## 2023-09-26 DIAGNOSIS — R7401 Elevation of levels of liver transaminase levels: Secondary | ICD-10-CM

## 2023-09-26 DIAGNOSIS — G9341 Metabolic encephalopathy: Secondary | ICD-10-CM

## 2023-09-26 DIAGNOSIS — K921 Melena: Secondary | ICD-10-CM

## 2023-09-26 DIAGNOSIS — R7881 Bacteremia: Secondary | ICD-10-CM | POA: Diagnosis not present

## 2023-09-26 DIAGNOSIS — A419 Sepsis, unspecified organism: Secondary | ICD-10-CM

## 2023-09-26 DIAGNOSIS — B3781 Candidal esophagitis: Secondary | ICD-10-CM

## 2023-09-26 DIAGNOSIS — G9340 Encephalopathy, unspecified: Secondary | ICD-10-CM

## 2023-09-26 DIAGNOSIS — R933 Abnormal findings on diagnostic imaging of other parts of digestive tract: Secondary | ICD-10-CM | POA: Diagnosis not present

## 2023-09-26 DIAGNOSIS — K7469 Other cirrhosis of liver: Secondary | ICD-10-CM

## 2023-09-26 DIAGNOSIS — R6520 Severe sepsis without septic shock: Secondary | ICD-10-CM

## 2023-09-26 DIAGNOSIS — I350 Nonrheumatic aortic (valve) stenosis: Secondary | ICD-10-CM | POA: Diagnosis not present

## 2023-09-26 LAB — CBC WITH DIFFERENTIAL/PLATELET
Abs Immature Granulocytes: 0.08 10*3/uL — ABNORMAL HIGH (ref 0.00–0.07)
Basophils Absolute: 0.1 10*3/uL (ref 0.0–0.1)
Basophils Relative: 1 %
Eosinophils Absolute: 0.2 10*3/uL (ref 0.0–0.5)
Eosinophils Relative: 3 %
HCT: 31.6 % — ABNORMAL LOW (ref 39.0–52.0)
Hemoglobin: 10.6 g/dL — ABNORMAL LOW (ref 13.0–17.0)
Immature Granulocytes: 1 %
Lymphocytes Relative: 16 %
Lymphs Abs: 1 10*3/uL (ref 0.7–4.0)
MCH: 36.2 pg — ABNORMAL HIGH (ref 26.0–34.0)
MCHC: 33.5 g/dL (ref 30.0–36.0)
MCV: 107.8 fL — ABNORMAL HIGH (ref 80.0–100.0)
Monocytes Absolute: 0.6 10*3/uL (ref 0.1–1.0)
Monocytes Relative: 11 %
Neutro Abs: 3.9 10*3/uL (ref 1.7–7.7)
Neutrophils Relative %: 68 %
Platelets: 120 10*3/uL — ABNORMAL LOW (ref 150–400)
RBC: 2.93 MIL/uL — ABNORMAL LOW (ref 4.22–5.81)
RDW: 16.8 % — ABNORMAL HIGH (ref 11.5–15.5)
WBC: 5.8 10*3/uL (ref 4.0–10.5)
nRBC: 0 % (ref 0.0–0.2)

## 2023-09-26 LAB — COMPREHENSIVE METABOLIC PANEL
ALT: 76 U/L — ABNORMAL HIGH (ref 0–44)
AST: 86 U/L — ABNORMAL HIGH (ref 15–41)
Albumin: 2.2 g/dL — ABNORMAL LOW (ref 3.5–5.0)
Alkaline Phosphatase: 88 U/L (ref 38–126)
Anion gap: 7 (ref 5–15)
BUN: 12 mg/dL (ref 8–23)
CO2: 23 mmol/L (ref 22–32)
Calcium: 8.6 mg/dL — ABNORMAL LOW (ref 8.9–10.3)
Chloride: 105 mmol/L (ref 98–111)
Creatinine, Ser: 0.93 mg/dL (ref 0.61–1.24)
GFR, Estimated: >60 mL/min (ref >60)
Glucose, Bld: 385 mg/dL — ABNORMAL HIGH (ref 70–99)
Potassium: 4.3 mmol/L (ref 3.5–5.1)
Sodium: 135 mmol/L (ref 135–145)
Total Bilirubin: 1.1 mg/dL (ref 0.0–1.2)
Total Protein: 5.6 g/dL — ABNORMAL LOW (ref 6.5–8.1)

## 2023-09-26 LAB — GLUCOSE, CAPILLARY
Glucose-Capillary: 272 mg/dL — ABNORMAL HIGH (ref 70–99)
Glucose-Capillary: 276 mg/dL — ABNORMAL HIGH (ref 70–99)
Glucose-Capillary: 283 mg/dL — ABNORMAL HIGH (ref 70–99)
Glucose-Capillary: 314 mg/dL — ABNORMAL HIGH (ref 70–99)
Glucose-Capillary: 392 mg/dL — ABNORMAL HIGH (ref 70–99)
Glucose-Capillary: 420 mg/dL — ABNORMAL HIGH (ref 70–99)

## 2023-09-26 LAB — PHOSPHORUS: Phosphorus: 1.9 mg/dL — ABNORMAL LOW (ref 2.5–4.6)

## 2023-09-26 LAB — MAGNESIUM: Magnesium: 1.7 mg/dL (ref 1.7–2.4)

## 2023-09-26 MED ORDER — INSULIN ASPART 100 UNIT/ML IJ SOLN
5.0000 [IU] | Freq: Three times a day (TID) | INTRAMUSCULAR | Status: DC
  Administered 2023-09-26 – 2023-09-27 (×2): 5 [IU] via SUBCUTANEOUS

## 2023-09-26 MED ORDER — MAGNESIUM SULFATE 2 GM/50ML IV SOLN
2.0000 g | Freq: Once | INTRAVENOUS | Status: AC
  Administered 2023-09-26: 2 g via INTRAVENOUS
  Filled 2023-09-26: qty 50

## 2023-09-26 MED ORDER — FLUCONAZOLE 200 MG PO TABS
400.0000 mg | ORAL_TABLET | Freq: Every day | ORAL | Status: DC
  Administered 2023-09-26 – 2023-09-27 (×2): 400 mg via ORAL
  Filled 2023-09-26 (×3): qty 2

## 2023-09-26 MED ORDER — METOPROLOL SUCCINATE ER 25 MG PO TB24
25.0000 mg | ORAL_TABLET | Freq: Every day | ORAL | Status: DC
  Administered 2023-09-26: 25 mg via ORAL
  Filled 2023-09-26 (×2): qty 1

## 2023-09-26 MED ORDER — K PHOS MONO-SOD PHOS DI & MONO 155-852-130 MG PO TABS
500.0000 mg | ORAL_TABLET | Freq: Once | ORAL | Status: AC
  Administered 2023-09-26: 500 mg via ORAL
  Filled 2023-09-26: qty 2

## 2023-09-26 NOTE — Progress Notes (Signed)
PHARMACY CONSULT NOTE FOR:  OUTPATIENT  PARENTERAL ANTIBIOTIC THERAPY (OPAT) - To be administered at SNF  Indication: Endocarditis Regimen: Ceftriaxone 2 g IV daily End date: 10/18/23 (4 weeks from blood culture clearance)   Thank you for allowing pharmacy to be a part of this patient's care.  Tony Cantu 09/26/2023, 11:33 AM

## 2023-09-26 NOTE — Hospital Course (Signed)
The patient is a 73 year old Caucasian male with a past medical history significant for but not limited to atrial fibrillation on anticoagulation, history of liver cirrhosis, seizure disorder, history of aortic stenosis, hypertension, peptic ulcer disease, diabetes mellitus, osteoarthritis with bilateral knee replacement, polyclonal gammopathy, history of OSA on CPAP as well as other comorbidities who presented to hospital due to generalized weakness, confusion and fever.  He was admitted and found to have sepsis secondary to streptococcal bacteremia acute metabolic encephalopathy.  Was also found esophageal varices on CT of the abdomen and pelvis.  TEE and EGD was recommended.  He does have underlying severe aortic stenosis he was deemed to be a candidate for sedation and if in hospital and was transferred to [facility] to have TEE and EGD concurrently.  Further workup revealed Streptococcus salivarius bacteremia and she was placed on IV cefepime ceftriaxone 2 g.  He was also noted to have esophageal candidiasis and worked up for his anemia and melena.  Assessment and Plan:  Sepsis Secondary Streptococcus Salivarius Bacteremia -Continue IV ceftriaxone.  No vegetations on 2D echo nor on TEE.  -CT scan abdomen pelvis showed mild left greater than right perinephric stranding with possible areas of cortical hypertension management involving the kidneys felt to be septic infarcts -Final antibiotic plan per ID who is following and they are recommending PICC line placement with 4 weeks of IV ceftriaxone with end of treatment date 10/18/2023 -Patient to follow-up with ID in outpatient setting and has a clinic appointment on 10/10/2023 at 3 PM -ID they are also recommending  3 weeks of p.o. Fluconazole   Acute Metabolic Encephalopathy -Resolved   Right-Sided Weakness -No acute stroke on MRI brain. -PT recommended discharge to SNF, patient agreeable, TOC working on that and disposition is pending and may be a  few days   Melena and Macrocytic Anemia -Hgb has improved spontaneously and is at baseline -EGD showed no signs bleeding -Hgb/Hct Trend: Recent Labs  Lab 09/20/23 0258 09/21/23 0507 09/22/23 0512 09/23/23 0427 09/24/23 0356 09/25/23 1318 09/26/23 0936  HGB 10.2* 9.8* 9.5* 9.5* 9.6* 11.0* 10.6*  HCT 32.1* 30.9* 30.1* 29.2* 28.0* 32.9* 31.6*  MCV 114.2* 112.4* 111.1* 109.0* 104.1* 107.9* 107.8*  -Check Anemia Panel in the AM -Continue to Monitor for S/Sx of Bleeding; No overt bleeding noted -Repeat CBC in the AM   Esophageal Candidiasis -Seen on EGD, will start fluconazole 21 days per GI Recommendations    Type I DM -Glucose elevated.  -Will increase semglee from 10 to 15,  -Added 2 units mealtime and increase to 5 units, and adjust sliding scale -CBG Trend: Recent Labs  Lab 09/25/23 1602 09/25/23 2110 09/26/23 0034 09/26/23 0724 09/26/23 1236 09/26/23 1239 09/26/23 1627  GLUCAP 383* 351* 272* 314* 420* 392* 283*    Paroxysmal Atrial Fibrillation -Resume apixaban given stable hgb and negative EGD -C/w Metoprolol Succinate 25 mg po Daily per cardiology -Continue to Monitor on Telemetry -Cardiology recommending considering a watchman as an outpatient  Hypophosphatemia -Phos Level Trend: Recent Labs  Lab 09/20/23 0258 09/26/23 0936  PHOS 3.2 1.9*  -Replete with K Phos Neutral 500 mg po x1 -Continue to Monitor and Replete as Necessary and repeat Phos Level in the AM  Severe Aortic Stenosis -2D echo on 09/20/2023 showed EF estimated at 55 to 60%, severe aortic stenosis, grade 1 diastolic dysfunction.  -Cardiology following was severe on recent echocardiogram but was moderate on the TEE -Given that the patient is very inactive and denies dyspnea or chest  pain or syncope the cardiology team recommends outpatient follow-up for further evaluation and feels that he may need a TAVR in the future   Liver Cirrhosis  Abnormal LFTs -Compensated.  Portal htn on CT abdomen  but no active bleeding seen.  -No varices on EGD  -LFTs are mildly elevated and have been down-trending and stablized  Recent Labs  Lab 09/20/23 0258 09/21/23 0507 09/22/23 0512 09/23/23 0427 09/24/23 0356 09/25/23 1217 09/26/23 0936  AST 65* 164* 143* 128* 92* 77* 86*  ALT 54* 80* 78* 82* 75* 71* 76*  -Continue to Monitor and Trend and Repeat CMP in the AM   Thrombocytopenia -In the setting of cirrhosis and worsened in the setting of infection -Platelet Count Trend: Recent Labs  Lab 09/20/23 0258 09/21/23 0507 09/22/23 0512 09/23/23 0427 09/24/23 0356 09/25/23 1318 09/26/23 0936  PLT 105* 87* 81* 87* 93* 104* 120*  -Continue to Monitor and Trend and repeat CMP in the AM   OSA -Has inspire device   Hypoalbuminemia -Patient's Albumin Trend: Recent Labs  Lab 09/20/23 0258 09/21/23 0507 09/22/23 0512 09/23/23 0427 09/24/23 0356 09/25/23 1217 09/26/23 0936  ALBUMIN 2.7* 2.5* 2.4* 2.3* 2.1* 2.3* 2.2*  -Continue to Monitor and Trend and repeat CMP in the AM  Class I Obesity -Complicates overall prognosis and care -Estimated body mass index is 30.97 kg/m as calculated from the following:   Height as of this encounter: 5\' 8"  (1.727 m).   Weight as of this encounter: 92.4 kg.  -Weight Loss and Dietary Counseling given

## 2023-09-26 NOTE — Progress Notes (Signed)
Rounding Note    Patient Name: Tony Cantu Date of Encounter: 09/26/2023  [facility] Cardiologist: Rollene Rotunda, MD   Subjective   No CP or dyspnea  Inpatient Medications    Scheduled Meds:  apixaban  5 mg Oral BID   Chlorhexidine Gluconate Cloth  6 each Topical Daily   feeding supplement  237 mL Oral BID BM   fluconazole  400 mg Oral Daily   insulin aspart  0-15 Units Subcutaneous TID WC   insulin aspart  0-5 Units Subcutaneous QHS   insulin aspart  2 Units Subcutaneous TID WC   insulin glargine-yfgn  15 Units Subcutaneous Daily   pantoprazole (PROTONIX) IV  40 mg Intravenous Q12H   sodium chloride flush  3 mL Intravenous Q12H   Continuous Infusions:  cefTRIAXone (ROCEPHIN)  IV 2 g (09/25/23 2134)   PRN Meds: acetaminophen, albuterol   Vital Signs    Vitals:   09/25/23 2028 09/26/23 0337 09/26/23 0727 09/26/23 0800  BP: (!) 103/56 109/64 111/66   Pulse: 72 66  86  Resp: 16 16 16    Temp: 98.2 F (36.8 C) 98.3 F (36.8 C) 98.3 F (36.8 C)   TempSrc: Oral Oral Oral   SpO2: 96% 94%  95%  Weight:      Height:        Intake/Output Summary (Last 24 hours) at 09/26/2023 1004 Last data filed at 09/26/2023 0800 Gross per 24 hour  Intake 1240 ml  Output 2200 ml  Net -960 ml      09/20/2023    2:04 PM 09/19/2023    5:09 PM 09/13/2023   10:42 AM  Last 3 Weights  Weight (lbs) 203 lb 11.3 oz 209 lb 7 oz 209 lb 12.8 oz  Weight (kg) 92.4 kg 95 kg 95.165 kg      Telemetry    Sinus with PVCs and NSVT- Personally Reviewed  Physical Exam   GEN: NAD, WD Neck: supple, no TM Cardiac: RRR, 3/6 systolic murmur Respiratory: CTA; no wheeze GI: Soft, NT/ND, no masses MS: No edema Neuro:  no focal findings Psych: Normal affect   Labs    High Sensitivity Troponin:   Recent Labs  Lab 09/19/23 1848 09/19/23 2045  TROPONINIHS 28* 35*     Chemistry Recent Labs  Lab 09/22/23 0512 09/23/23 0427 09/24/23 0356 09/25/23 1217  NA 138 136 136 138   K 3.7 3.6 3.3* 4.3  CL 108 108 106 107  CO2 23 22 22  17*  GLUCOSE 262* 235* 170* 237*  BUN 17 17 13 10   CREATININE 0.88 0.77 0.81 0.97  CALCIUM 8.5* 8.3* 8.4* 8.9  MG 1.8 1.9 1.7  --   PROT 5.4* 5.2* 5.5* 6.1*  ALBUMIN 2.4* 2.3* 2.1* 2.3*  AST 143* 128* 92* 77*  ALT 78* 82* 75* 71*  ALKPHOS 66 73 77 85  BILITOT 0.7 0.5 0.6 1.0  GFRNONAA >60 >60 >60 >60  ANIONGAP 7 6 8 14      Hematology Recent Labs  Lab 09/24/23 0356 09/25/23 1318 09/26/23 0936  WBC 4.8 7.6 5.8  RBC 2.69* 3.05* 2.93*  HGB 9.6* 11.0* 10.6*  HCT 28.0* 32.9* 31.6*  MCV 104.1* 107.9* 107.8*  MCH 35.7* 36.1* 36.2*  MCHC 34.3 33.4 33.5  RDW 16.3* 16.8* 16.8*  PLT 93* 104* 120*      Patient Profile     73 y.o. male with past medical history of paroxysmal atrial fibrillation, aortic stenosis, hypertension, hyperlipidemia, diabetes mellitus, cirrhosis with gastric  varices, gastric ulcers, peptic ulcer disease, obstructive sleep apnea admitted with febrile illness and found to be bacteremic.  Also with severe aortic stenosis.  Transferred from [place] to [facility] for EGD and TEE.  Echocardiogram January 2025 showed normal LV function, mild to moderate left ventricular enlargement, grade 1 diastolic dysfunction, mild mitral stenosis, severe aortic stenosis with mean gradient 46.3 mmHg.  Transesophageal echocardiogram showed normal LV function, moderate left atrial enlargement, mild mitral regurgitation, moderate aortic stenosis with mean gradient 28 mmHg, trace aortic insufficiency, no vegetations noted.  EGD showed esophageal plaques consistent with candidiasis, gastric antral vascular ectasia, portal hypertensive gastropathy.  Assessment & Plan    1 strep bacteremia-transesophageal echocardiogram shows no vegetations.  2 aortic stenosis-severe on recent echocardiogram; moderate on TEE.  Patient is very inactive; however he denies dyspnea, chest pain or syncope.  Can follow-up as an outpatient for  further evaluation.  May need TAVR in the near future.  3 paroxysmal atrial fibrillation-patient remains in sinus rhythm.  Blood pressure remains borderline.  Cardizem was discontinued.  Will try low-dose metoprolol at 25 mg daily.  Apixaban has been resumed.  Would consider watchman as an outpatient.  4 metabolic encephalopathy-resolved.  5 history of thrombocytopenia-felt secondary to cirrhosis and possible sepsis.  Platelet count is improved to 120,000.  6 anemia/melena-hemoglobin unchanged.  7 history of cirrhosis-Per gastroenterology and primary service.  Cardiology will sign off.  Please call with questions.  Continue present medications at discharge.  Will arrange follow-up in [place] with Dr Antoine Poche in 4 to 6 weeks.  For questions or updates, please contact [facility] Please consult [email or web address] for contact info under        Signed, Olga Millers, MD  09/26/2023, 10:04 AM

## 2023-09-26 NOTE — Inpatient Diabetes Management (Addendum)
Inpatient Diabetes Program Recommendations  AACE/ADA: New Consensus Statement on Inpatient Glycemic Control   Target Ranges:  Prepandial:   less than 140 mg/dL      Peak postprandial:   less than 180 mg/dL (1-2 hours)      Critically ill patients:  140 - 180 mg/dL     Review of Glycemic Control  Latest Reference Range & Units 09/25/23 07:33 09/25/23 10:17 09/25/23 11:39 09/25/23 12:18 09/25/23 16:02 09/25/23 21:10 09/26/23 00:34 09/26/23 07:24  Glucose-Capillary 70 - 99 mg/dL 478 (H) 295 (H) 621 (H) 244 (H) 383 (H) 351 (H) 272 (H) 314 (H)   Diabetes history: DM2 Outpatient Diabetes medications: Toujeo 20-50 units daily, Humalog 20-30 units with meals Current orders for Inpatient glycemic control:  Novolog 0-15 units tid + hs Novolog 2 units tid Semglee 15 units Daily  Ensure Enlive bid between meals (40 grams of carbohydrates)  Inpatient Diabetes Program Recommendations:    -   Increase Novolog meal coverage to 5 units tid if eating >50% of meals  Fasting glucose maybe elevated due to not enough meal coverage. Hold on basal increase for now. Pt also on a high carb supplement.  Thanks,  Christena Deem RN, MSN, BC-ADM Inpatient Diabetes Coordinator Team Pager [phone] (8a-5p)

## 2023-09-26 NOTE — Progress Notes (Signed)
Occupational Therapy Treatment Patient Details Name: Tony Cantu MRN: [id number] DOB: 1951-04-19 Today's Date: 09/26/2023   History of present illness 73 y.o. male presents to [facility] hospital on 09/23/2023 as a transfer from [facility] with admitted for sepsis 2/2 Streptococcus bacteremia and acute metabolic encephalopathy. Pt to undergo TEE and EGD on 1/27. PMH includes DM, afib, cirrhosis, seizure disorder, aortic stenosis, HTN, OA, polyclonal gammopathy, OSA.   OT comments  Pt making good progress towards OT goals. Pt able to mobilize to sink and around room using RW with no more than Min A. Pt also able to stand at sink for ADLs > 5 min without seated rest break. Pt denies SOB or pain with activity but does endorse fatigue; B LE noted to be shaky with progressive standing activities. Encouraged therapeutic exercises while up in recliner w/ pt verbalizing understanding. Continue to recommend inpatient follow up therapy, <3 hours/day at DC.      If plan is discharge home, recommend the following:  A lot of help with bathing/dressing/bathroom;Assistance with cooking/housework;Assist for transportation;Help with stairs or ramp for entrance;A little help with walking and/or transfers   Equipment Recommendations  None recommended by OT    Recommendations for Other Services      Precautions / Restrictions Precautions Precautions: Fall Restrictions Weight Bearing Restrictions Per Provider Order: No       Mobility Bed Mobility Overal bed mobility: Needs Assistance Bed Mobility: Supine to Sit     Supine to sit: Mod assist, HOB elevated, Used rails     General bed mobility comments: assist to scoot hips and lift trunk. good use of bedrails    Transfers Overall transfer level: Needs assistance Equipment used: Rolling walker (2 wheels) Transfers: Sit to/from Stand Sit to Stand: Min assist           General transfer comment: Min A from elevated bed with use of RW     Balance  Overall balance assessment: Needs assistance Sitting-balance support: Feet supported, No upper extremity supported Sitting balance-Leahy Scale: Fair     Standing balance support: Bilateral upper extremity supported, Single extremity supported, During functional activity Standing balance-Leahy Scale: Poor Standing balance comment: reliant on one UE support standing at sink and BUE on RW for mobility                           ADL either performed or assessed with clinical judgement   ADL Overall ADL's : Needs assistance/impaired     Grooming: Minimal assistance;Standing;Oral care;Wash/dry face Grooming Details (indicate cue type and reason): cues for stepping close to sink, use of one UE for support and OT assisting to place toothpaste on toothbrush to allow pt continuous one UE support on sink. Pt able to stand 5 min for task, declined need for seated rest break                             Functional mobility during ADLs: Minimal assistance;Rolling walker (2 wheels) General ADL Comments: Focus on mobility to sink, standing endurance for ADLs and further mobility from sink around bedside to recliner with RW.    Extremity/Trunk Assessment Upper Extremity Assessment Upper Extremity Assessment: Generalized weakness;Right hand dominant   Lower Extremity Assessment Lower Extremity Assessment: Defer to PT evaluation        Vision   Vision Assessment?: No apparent visual deficits   Perception     Praxis  Cognition Arousal: Alert Behavior During Therapy: WFL for tasks assessed/performed Overall Cognitive Status: Within Functional Limits for tasks assessed                                          Exercises      Shoulder Instructions       General Comments Hr 80s at rest, 100s with activity. Encouraged ankle pumps, quad activation, leg lifts and pulling self forward with recliner armrests while up for strengthening    Pertinent  Vitals/ Pain       Pain Assessment Pain Assessment: No/denies pain  Home Living                                          Prior Functioning/Environment              Frequency  Min 1X/week        Progress Toward Goals  OT Goals(current goals can now be found in the care plan section)  Progress towards OT goals: Progressing toward goals  Acute Rehab OT Goals Patient Stated Goal: regain strength, shave my face (NT and RN aware) OT Goal Formulation: With patient Time For Goal Achievement: 10/05/23 Potential to Achieve Goals: Good ADL Goals Pt Will Perform Grooming: with modified independence;sitting Pt Will Perform Upper Body Dressing: with modified independence;sitting Pt Will Perform Lower Body Dressing: with mod assist;sitting/lateral leans Pt Will Transfer to Toilet: with contact guard assist;with min assist;stand pivot transfer Pt Will Perform Toileting - Clothing Manipulation and hygiene: with contact guard assist;sitting/lateral leans Pt/caregiver will Perform Home Exercise Program: Increased ROM;Increased strength;Both right and left upper extremity;Independently  Plan      Co-evaluation                 AM-PAC OT "6 Clicks" Daily Activity     Outcome Measure   Help from another person eating meals?: None Help from another person taking care of personal grooming?: A Little Help from another person toileting, which includes using toliet, bedpan, or urinal?: A Little Help from another person bathing (including washing, rinsing, drying)?: A Lot Help from another person to put on and taking off regular upper body clothing?: A Little Help from another person to put on and taking off regular lower body clothing?: A Lot 6 Click Score: 17    End of Session Equipment Utilized During Treatment: Gait belt;Rolling walker (2 wheels)  OT Visit Diagnosis: Unsteadiness on feet (R26.81);Other abnormalities of gait and mobility (R26.89);Muscle weakness  (generalized) (M62.81)   Activity Tolerance Patient tolerated treatment well   Patient Left in chair;with call bell/phone within reach;with chair alarm set   Nurse Communication Mobility status        Time: 4098-1191 OT Time Calculation (min): 27 min  Charges: OT General Charges $OT Visit: 1 Visit OT Treatments $Self Care/Home Management : 23-37 mins  Bradd Canary, OTR/L Acute Rehab Services Office: [phone]   Lorre Munroe 09/26/2023, 11:04 AM

## 2023-09-26 NOTE — Progress Notes (Signed)
PHARMACY NOTE:  ANTIMICROBIAL RENAL DOSAGE ADJUSTMENT  Current antimicrobial regimen includes a mismatch between antimicrobial dosage and estimated renal function.  As per policy approved by the Pharmacy & Therapeutics and Medical Executive Committees, the antimicrobial dosage will be adjusted accordingly.  Current antimicrobial dosage:  Fluconazole 100 mg daily  Indication: Esophageal candidiasis  Renal Function:  Estimated Creatinine Clearance: 74.8 mL/min (by C-G formula based on SCr of 0.97 mg/dL). []      On intermittent HD, scheduled: []      On CRRT    Antimicrobial dosage has been changed to:  Fluconazole 400 mg dialy  Thank you for allowing pharmacy to be a part of this patient's care.  Dalene Carrow, [facility] 09/26/2023 8:40 AM

## 2023-09-26 NOTE — Progress Notes (Signed)
PROGRESS NOTE    Tony Cantu  ZOX:[id number] DOB: Jul 06, 1951 DOA: 09/19/2023 PCP: [name], DO   Brief Narrative:  The patient is a 73 year old Caucasian male with a past medical history significant for but not limited to atrial fibrillation on anticoagulation, history of liver cirrhosis, seizure disorder, history of aortic stenosis, hypertension, peptic ulcer disease, diabetes mellitus, osteoarthritis with bilateral knee replacement, polyclonal gammopathy, history of OSA on CPAP as well as other comorbidities who presented to hospital due to generalized weakness, confusion and fever.  He was admitted and found to have sepsis secondary to streptococcal bacteremia acute metabolic encephalopathy.  Was also found esophageal varices on CT of the abdomen and pelvis.  TEE and EGD was recommended.  He does have underlying severe aortic stenosis he was deemed to be a candidate for sedation and if in hospital and was transferred to [facility] to have TEE and EGD concurrently.  Further workup revealed Streptococcus salivarius bacteremia and she was placed on IV cefepime ceftriaxone 2 g.  He was also noted to have esophageal candidiasis and worked up for his anemia and melena.  Assessment and Plan:  Sepsis Secondary Streptococcus Salivarius Bacteremia -Continue IV ceftriaxone.  No vegetations on 2D echo nor on TEE.  -CT scan abdomen pelvis showed mild left greater than right perinephric stranding with possible areas of cortical hypertension management involving the kidneys felt to be septic infarcts -Final antibiotic plan per ID who is following and they are recommending PICC line placement with 4 weeks of IV ceftriaxone with end of treatment date 10/18/2023 -Patient to follow-up with ID in outpatient setting and has a clinic appointment on 10/10/2023 at 3 PM -ID they are also recommending  3 weeks of p.o. Fluconazole   Acute Metabolic Encephalopathy -Resolved   Right-Sided Weakness -No acute  stroke on MRI brain. -PT recommended discharge to SNF, patient agreeable, TOC working on that and disposition is pending and may be a few days   Melena and Macrocytic Anemia -Hgb has improved spontaneously and is at baseline -EGD showed no signs bleeding -Hgb/Hct Trend: Recent Labs  Lab 09/20/23 0258 09/21/23 0507 09/22/23 0512 09/23/23 0427 09/24/23 0356 09/25/23 1318 09/26/23 0936  HGB 10.2* 9.8* 9.5* 9.5* 9.6* 11.0* 10.6*  HCT 32.1* 30.9* 30.1* 29.2* 28.0* 32.9* 31.6*  MCV 114.2* 112.4* 111.1* 109.0* 104.1* 107.9* 107.8*  -Check Anemia Panel in the AM -Continue to Monitor for S/Sx of Bleeding; No overt bleeding noted -Repeat CBC in the AM   Esophageal Candidiasis -Seen on EGD, will start fluconazole 21 days per GI Recommendations    Type I DM -Glucose elevated.  -Will increase semglee from 10 to 15,  -Added 2 units mealtime and increase to 5 units, and adjust sliding scale -CBG Trend: Recent Labs  Lab 09/25/23 1602 09/25/23 2110 09/26/23 0034 09/26/23 0724 09/26/23 1236 09/26/23 1239 09/26/23 1627  GLUCAP 383* 351* 272* 314* 420* 392* 283*    Paroxysmal Atrial Fibrillation -Resume apixaban given stable hgb and negative EGD -C/w Metoprolol Succinate 25 mg po Daily per cardiology -Continue to Monitor on Telemetry -Cardiology recommending considering a watchman as an outpatient  Hypophosphatemia -Phos Level Trend: Recent Labs  Lab 09/20/23 0258 09/26/23 0936  PHOS 3.2 1.9*  -Replete with K Phos Neutral 500 mg po x1 -Continue to Monitor and Replete as Necessary and repeat Phos Level in the AM  Severe Aortic Stenosis -2D echo on 09/20/2023 showed EF estimated at 55 to 60%, severe aortic stenosis, grade 1 diastolic dysfunction.  -Cardiology  following was severe on recent echocardiogram but was moderate on the TEE -Given that the patient is very inactive and denies dyspnea or chest pain or syncope the cardiology team recommends outpatient follow-up for further  evaluation and feels that he may need a TAVR in the future   Liver Cirrhosis  Abnormal LFTs -Compensated.  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  -No varices on EGD  -LFTs are mildly elevated and have been down-trending and stablized  Recent Labs  Lab 09/20/23 0258 09/21/23 0507 09/22/23 0512 09/23/23 0427 09/24/23 0356 09/25/23 1217 09/26/23 0936  AST 65* 164* 143* 128* 92* 77* 86*  ALT 54* 80* 78* 82* 75* 71* 76*  -Continue to Monitor and Trend and Repeat CMP in the AM   Thrombocytopenia -In the setting of cirrhosis and worsened in the setting of infection -Platelet Count Trend: Recent Labs  Lab 09/20/23 0258 09/21/23 0507 09/22/23 0512 09/23/23 0427 09/24/23 0356 09/25/23 1318 09/26/23 0936  PLT 105* 87* 81* 87* 93* 104* 120*  -Continue to Monitor and Trend and repeat CMP in the AM   OSA -Has inspire device   Hypoalbuminemia -Patient's Albumin Trend: Recent Labs  Lab 09/20/23 0258 09/21/23 0507 09/22/23 0512 09/23/23 0427 09/24/23 0356 09/25/23 1217 09/26/23 0936  ALBUMIN 2.7* 2.5* 2.4* 2.3* 2.1* 2.3* 2.2*  -Continue to Monitor and Trend and repeat CMP in the AM  Class I Obesity -Complicates overall prognosis and care -Estimated body mass index is 30.97 kg/m as calculated from the following:   Height as of this encounter: 5\' 8"  (1.727 m).   Weight as of this encounter: 92.4 kg.  -Weight Loss and Dietary Counseling given   DVT prophylaxis: Place and maintain sequential compression device Start: 09/20/23 0522 apixaban (ELIQUIS) tablet 5 mg    Code Status: Full Code Family Communication: No family present at bedside  Disposition Plan:  Level of care: Telemetry Medical Status is: Inpatient Remains inpatient appropriate because: Needs SNF   Consultants:  Cardiology ID Gastroenterology   Procedures:  As delineated as above  Antimicrobials:  Anti-infectives (From admission, onward)    Start     Dose/Rate Route Frequency  Ordered Stop   09/26/23 1000  fluconazole (DIFLUCAN) tablet 400 mg        400 mg Oral Daily 09/26/23 0839 10/16/23 0959   09/25/23 1330  fluconazole (DIFLUCAN) tablet 100 mg  Status:  Discontinued        100 mg Oral Daily 09/25/23 1237 09/26/23 0839   09/20/23 2200  vancomycin (VANCOREADY) IVPB 1500 mg/300 mL  Status:  Discontinued        1,500 mg 150 mL/hr over 120 Minutes Intravenous Every 24 hours 09/20/23 1020 09/20/23 1529   09/20/23 2200  cefTRIAXone (ROCEPHIN) 2 g in sodium chloride 0.9 % 100 mL IVPB        2 g 200 mL/hr over 30 Minutes Intravenous Every 24 hours 09/20/23 1529     09/20/23 0600  ceFEPIme (MAXIPIME) 2 g in sodium chloride 0.9 % 100 mL IVPB  Status:  Discontinued        2 g 200 mL/hr over 30 Minutes Intravenous Every 8 hours 09/20/23 0038 09/20/23 1529   09/20/23 0045  metroNIDAZOLE (FLAGYL) IVPB 500 mg  Status:  Discontinued        500 mg 100 mL/hr over 60 Minutes Intravenous Every 12 hours 09/20/23 0033 09/20/23 1529   09/19/23 2200  vancomycin (VANCOREADY) IVPB 2000 mg/400 mL  2,000 mg 200 mL/hr over 120 Minutes Intravenous  Once 09/19/23 2146 09/20/23 0103   09/19/23 2145  ceFEPIme (MAXIPIME) 2 g in sodium chloride 0.9 % 100 mL IVPB        2 g 200 mL/hr over 30 Minutes Intravenous  Once 09/19/23 2143 09/19/23 2247       Subjective: Seen and examined at bedside is doing okay.  Denies chest pain or shortness breath.  No nausea or vomiting.  No other concerns requested this time.  Objective: Vitals:   09/26/23 1200 09/26/23 1340 09/26/23 1400 09/26/23 1943  BP:  124/72  101/62  Pulse: 70  67 66  Resp:    16  Temp:    98.3 F (36.8 C)  TempSrc:    Oral  SpO2: 96%  97% 96%  Weight:      Height:        Intake/Output Summary (Last 24 hours) at 09/26/2023 2010 Last data filed at 09/26/2023 1400 Gross per 24 hour  Intake 720 ml  Output 3500 ml  Net -2780 ml   Filed Weights   09/19/23 1709 09/20/23 1404  Weight: 95 kg 92.4 kg    Examination: Physical Exam:  Constitutional: WN/WD elderly obese chronically ill-appearing Caucasian male in no acute distress Respiratory: Diminished to auscultation bilaterally, no wheezing, rales, rhonchi or crackles. Normal respiratory effort and patient is not tachypenic. No accessory muscle use.  Unlabored breathing Cardiovascular: RRR, is a 3 out of 6 systolic murmur. S1 and S2 auscultated. No extremity edema. 2+ pedal pulses. No carotid bruits.  Abdomen: Soft, non-tender, distended secondary body habitus bowel sounds positive.  GU: Deferred. Musculoskeletal: No clubbing / cyanosis of digits/nails. No joint deformity upper and lower extremities.  Skin: No rashes, lesions, ulcers on limited skin evaluation. No induration; Warm and dry.  Neurologic: CN 2-12 grossly intact with no focal deficits. Romberg sign and cerebellar reflexes not assessed.  Psychiatric: Normal judgment and insight. Alert and oriented x 3. Normal mood and appropriate affect.   Data Reviewed: I have personally reviewed following labs and imaging studies  CBC: Recent Labs  Lab 09/22/23 0512 09/23/23 0427 09/24/23 0356 09/25/23 1318 09/26/23 0936  WBC 4.4 4.1 4.8 7.6 5.8  NEUTROABS  --   --   --   --  3.9  HGB 9.5* 9.5* 9.6* 11.0* 10.6*  HCT 30.1* 29.2* 28.0* 32.9* 31.6*  MCV 111.1* 109.0* 104.1* 107.9* 107.8*  PLT 81* 87* 93* 104* 120*   Basic Metabolic Panel: Recent Labs  Lab 09/20/23 0258 09/21/23 0507 09/22/23 0512 09/23/23 0427 09/24/23 0356 09/25/23 1217 09/26/23 0936  NA 141 143 138 136 136 138 135  K 4.3 3.6 3.7 3.6 3.3* 4.3 4.3  CL 111 115* 108 108 106 107 105  CO2 20* 23 23 22 22  17* 23  GLUCOSE 266* 225* 262* 235* 170* 237* 385*  BUN 15 18 17 17 13 10 12   CREATININE 1.13 0.95 0.88 0.77 0.81 0.97 0.93  CALCIUM 8.8* 8.8* 8.5* 8.3* 8.4* 8.9 8.6*  MG 1.6* 2.1 1.8 1.9 1.7  --  1.7  PHOS 3.2  --   --   --   --   --  1.9*   GFR: Estimated Creatinine Clearance: 78 mL/min (by C-G  formula based on SCr of 0.93 mg/dL). Liver Function Tests: Recent Labs  Lab 09/22/23 0512 09/23/23 0427 09/24/23 0356 09/25/23 1217 09/26/23 0936  AST 143* 128* 92* 77* 86*  ALT 78* 82* 75* 71* 76*  ALKPHOS 66 73  77 85 88  BILITOT 0.7 0.5 0.6 1.0 1.1  PROT 5.4* 5.2* 5.5* 6.1* 5.6*  ALBUMIN 2.4* 2.3* 2.1* 2.3* 2.2*   No results for input(s): "LIPASE", "AMYLASE" in the last 168 hours. Recent Labs  Lab 09/20/23 0532  AMMONIA 13   Coagulation Profile: Recent Labs  Lab 09/20/23 1321 09/22/23 1250  INR 1.4* 1.1   Cardiac Enzymes: No results for input(s): "CKTOTAL", "CKMB", "CKMBINDEX", "TROPONINI" in the last 168 hours. BNP (last 3 results) No results for input(s): "PROBNP" in the last 8760 hours. HbA1C: No results for input(s): "HGBA1C" in the last 72 hours. CBG: Recent Labs  Lab 09/26/23 0034 09/26/23 0724 09/26/23 1236 09/26/23 1239 09/26/23 1627  GLUCAP 272* 314* 420* 392* 283*   Lipid Profile: No results for input(s): "CHOL", "HDL", "LDLCALC", "TRIG", "CHOLHDL", "LDLDIRECT" in the last 72 hours. Thyroid Function Tests: No results for input(s): "TSH", "T4TOTAL", "FREET4", "T3FREE", "THYROIDAB" in the last 72 hours. Anemia Panel: No results for input(s): "VITAMINB12", "FOLATE", "FERRITIN", "TIBC", "IRON", "RETICCTPCT" in the last 72 hours. Sepsis Labs: Recent Labs  Lab 09/19/23 2045  LATICACIDVEN 1.8   Recent Results (from the past 240 hours)  Blood culture (routine x 2)     Status: Abnormal   Collection Time: 09/19/23  6:48 PM   Specimen: BLOOD LEFT HAND  Result Value Ref Range Status   Specimen Description   Final    BLOOD LEFT HAND Performed at [facility], [address], [place], [place] [address]    Special Requests   Final    BOTTLES DRAWN AEROBIC ONLY Blood Culture results may not be optimal due to an inadequate volume of blood received in culture bottles Performed at [facility], [address], [place], [place] [address]    Culture   Setup Time   Final    GRAM POSITIVE COCCI AEROBIC BOTTLE ONLY Gram Stain Report Called to,Read Back By and Verified With: KRISTY EDWARDS AT 0859 09/20/2023 BY A. SNYDER GRAM STAIN REVIEWED-AGREE WITH RESULT DRT    Culture (A)  Final    STREPTOCOCCUS SALIVARIUS SUSCEPTIBILITIES PERFORMED ON PREVIOUS CULTURE WITHIN THE LAST 5 DAYS. Performed at [facility], [address], [place], [place] [address]    Report Status 09/22/2023 FINAL  Final  Blood culture (routine x 2)     Status: Abnormal   Collection Time: 09/19/23  6:48 PM   Specimen: BLOOD LEFT HAND  Result Value Ref Range Status   Specimen Description   Final    BLOOD LEFT HAND Performed at [facility], [address] [place], [place] [address]    Special Requests   Final    BOTTLES DRAWN AEROBIC ONLY Blood Culture results may not be optimal due to an inadequate volume of blood received in culture bottles Performed at [facility], [address] [place], [place] [address]    Culture  Setup Time   Final    GRAM POSITIVE COCCI AEROBIC BOTTLE ONLY Gram Stain Report Called to,Read Back By and Verified With: KRISTY EDWARDS AT 0859 09/20/2023 BY A. SNYDER GRAM STAIN REVIEWED-AGREE WITH RESULT DRT CRITICAL RESULT CALLED TO, READ BACK BY AND VERIFIED WITH: PHARMD F.WILSON [id number] @ 1518 FH Performed at [facility], [address], [place], [place] [address]    Culture STREPTOCOCCUS SALIVARIUS (A)  Final   Report Status 09/22/2023 FINAL  Final   Organism ID, Bacteria STREPTOCOCCUS SALIVARIUS  Final      Susceptibility   Streptococcus salivarius -  MIC*    PENICILLIN 0.25 INTERMEDIATE Intermediate     CEFTRIAXONE <=0.12 SENSITIVE Sensitive     ERYTHROMYCIN 2 RESISTANT Resistant     LEVOFLOXACIN 2 SENSITIVE Sensitive     VANCOMYCIN 1 SENSITIVE Sensitive     * STREPTOCOCCUS SALIVARIUS  Blood Culture ID Panel (Reflexed)     Status: Abnormal   Collection Time: 09/19/23  6:48 PM  Result Value Ref Range Status    Enterococcus faecalis NOT DETECTED NOT DETECTED Final   Enterococcus Faecium NOT DETECTED NOT DETECTED Final   Listeria monocytogenes NOT DETECTED NOT DETECTED Final   Staphylococcus species NOT DETECTED NOT DETECTED Final   Staphylococcus aureus (BCID) NOT DETECTED NOT DETECTED Final   Staphylococcus epidermidis NOT DETECTED NOT DETECTED Final   Staphylococcus lugdunensis NOT DETECTED NOT DETECTED Final   Streptococcus species DETECTED (A) NOT DETECTED Final    Comment: Not Enterococcus species, Streptococcus agalactiae, Streptococcus pyogenes, or Streptococcus pneumoniae. CRITICAL RESULT CALLED TO, READ BACK BY AND VERIFIED WITH: PHARMD F.WILSON [id number] @ 1518 FH    Streptococcus agalactiae NOT DETECTED NOT DETECTED Final   Streptococcus pneumoniae NOT DETECTED NOT DETECTED Final   Streptococcus pyogenes NOT DETECTED NOT DETECTED Final   A.calcoaceticus-baumannii NOT DETECTED NOT DETECTED Final   Bacteroides fragilis NOT DETECTED NOT DETECTED Final   Enterobacterales NOT DETECTED NOT DETECTED Final   Enterobacter cloacae complex NOT DETECTED NOT DETECTED Final   Escherichia coli NOT DETECTED NOT DETECTED Final   Klebsiella aerogenes NOT DETECTED NOT DETECTED Final   Klebsiella oxytoca NOT DETECTED NOT DETECTED Final   Klebsiella pneumoniae NOT DETECTED NOT DETECTED Final   Proteus species NOT DETECTED NOT DETECTED Final   Salmonella species NOT DETECTED NOT DETECTED Final   Serratia marcescens NOT DETECTED NOT DETECTED Final   Haemophilus influenzae NOT DETECTED NOT DETECTED Final   Neisseria meningitidis NOT DETECTED NOT DETECTED Final   Pseudomonas aeruginosa NOT DETECTED NOT DETECTED Final   Stenotrophomonas maltophilia NOT DETECTED NOT DETECTED Final   Candida albicans NOT DETECTED NOT DETECTED Final   Candida auris NOT DETECTED NOT DETECTED Final   Candida glabrata NOT DETECTED NOT DETECTED Final   Candida krusei NOT DETECTED NOT DETECTED Final   Candida parapsilosis NOT  DETECTED NOT DETECTED Final   Candida tropicalis NOT DETECTED NOT DETECTED Final   Cryptococcus neoformans/gattii NOT DETECTED NOT DETECTED Final    Comment: Performed at [facility], [address] [place], [place] [address]  Resp panel by RT-PCR (RSV, Flu A&B, Covid) Anterior Nasal Swab     Status: None   Collection Time: 09/19/23  7:30 PM   Specimen: Anterior Nasal Swab  Result Value Ref Range Status   SARS Coronavirus 2 by RT PCR NEGATIVE NEGATIVE Final    Comment: (NOTE) SARS-CoV-2 target nucleic acids are NOT DETECTED.  The SARS-CoV-2 RNA is generally detectable in upper respiratory specimens during the acute phase of infection. The lowest concentration of SARS-CoV-2 viral copies this assay can detect is 138 copies/mL. A negative result does not preclude SARS-Cov-2 infection and should not be used as the sole basis for treatment or other patient management decisions. A negative result may occur with  improper specimen collection/handling, submission of specimen other than nasopharyngeal swab, presence of viral mutation(s) within the areas targeted by this assay, and inadequate number of viral copies(<138 copies/mL). A negative result must be combined with clinical observations, patient history, and epidemiological information. The expected result is Negative.  Fact Sheet for Patients:  BloggerCourse.com  Fact Sheet  for Healthcare Providers:  SeriousBroker.it  This test is no t yet approved or cleared by the [place] and  has been authorized for detection and/or diagnosis of SARS-CoV-2 by FDA under an Emergency Use Authorization (EUA). This EUA will remain  in effect (meaning this test can be used) for the duration of the COVID-19 declaration under Section 564(b)(1) of the Act, 21 U.S.C.section 360bbb-3(b)(1), unless the authorization is terminated  or revoked sooner.       Influenza A by PCR NEGATIVE  NEGATIVE Final   Influenza B by PCR NEGATIVE NEGATIVE Final    Comment: (NOTE) The Xpert Xpress SARS-CoV-2/FLU/RSV plus assay is intended as an aid in the diagnosis of influenza from Nasopharyngeal swab specimens and should not be used as a sole basis for treatment. Nasal washings and aspirates are unacceptable for Xpert Xpress SARS-CoV-2/FLU/RSV testing.  Fact Sheet for Patients: BloggerCourse.com  Fact Sheet for Healthcare Providers: SeriousBroker.it  This test is not yet approved or cleared by the [place] FDA and has been authorized for detection and/or diagnosis of SARS-CoV-2 by FDA under an Emergency Use Authorization (EUA). This EUA will remain in effect (meaning this test can be used) for the duration of the COVID-19 declaration under Section 564(b)(1) of the Act, 21 U.S.C. section 360bbb-3(b)(1), unless the authorization is terminated or revoked.     Resp Syncytial Virus by PCR NEGATIVE NEGATIVE Final    Comment: (NOTE) Fact Sheet for Patients: BloggerCourse.com  Fact Sheet for Healthcare Providers: SeriousBroker.it  This test is not yet approved or cleared by the [place] FDA and has been authorized for detection and/or diagnosis of SARS-CoV-2 by FDA under an Emergency Use Authorization (EUA). This EUA will remain in effect (meaning this test can be used) for the duration of the COVID-19 declaration under Section 564(b)(1) of the Act, 21 U.S.C. section 360bbb-3(b)(1), unless the authorization is terminated or revoked.  Performed at [facility], [address], [place], [place] [address]   Respiratory (~20 pathogens) panel by PCR     Status: None   Collection Time: 09/20/23  7:20 AM   Specimen: Nasopharyngeal Swab; Respiratory  Result Value Ref Range Status   Adenovirus NOT DETECTED NOT DETECTED Final   Coronavirus 229E NOT DETECTED NOT DETECTED Final     Comment: (NOTE) The Coronavirus on the Respiratory Panel, DOES NOT test for the novel  Coronavirus (2019 nCoV)    Coronavirus HKU1 NOT DETECTED NOT DETECTED Final   Coronavirus NL63 NOT DETECTED NOT DETECTED Final   Coronavirus OC43 NOT DETECTED NOT DETECTED Final   Metapneumovirus NOT DETECTED NOT DETECTED Final   Rhinovirus / Enterovirus NOT DETECTED NOT DETECTED Final   Influenza A NOT DETECTED NOT DETECTED Final   Influenza B NOT DETECTED NOT DETECTED Final   Parainfluenza Virus 1 NOT DETECTED NOT DETECTED Final   Parainfluenza Virus 2 NOT DETECTED NOT DETECTED Final   Parainfluenza Virus 3 NOT DETECTED NOT DETECTED Final   Parainfluenza Virus 4 NOT DETECTED NOT DETECTED Final   Respiratory Syncytial Virus NOT DETECTED NOT DETECTED Final   Bordetella pertussis NOT DETECTED NOT DETECTED Final   Bordetella Parapertussis NOT DETECTED NOT DETECTED Final   Chlamydophila pneumoniae NOT DETECTED NOT DETECTED Final   Mycoplasma pneumoniae NOT DETECTED NOT DETECTED Final    Comment: Performed at [facility], [address], [place], [place] [address]  MRSA Next Gen by PCR, Nasal     Status: None   Collection Time: 09/20/23 12:35 PM   Specimen:  Nasal Mucosa; Nasal Swab  Result Value Ref Range Status   MRSA by PCR Next Gen NOT DETECTED NOT DETECTED Final    Comment: (NOTE) The GeneXpert MRSA Assay (FDA approved for NASAL specimens only), is one component of a comprehensive MRSA colonization surveillance program. It is not intended to diagnose MRSA infection nor to guide or monitor treatment for MRSA infections. Test performance is not FDA approved in patients less than 81 years old. Performed at [facility], [address], [place], [place] [address]   Culture, blood (Routine X 2) w Reflex to ID Panel     Status: None   Collection Time: 09/20/23  4:55 PM   Specimen: BLOOD  Result Value Ref Range Status   Specimen Description BLOOD BLOOD LEFT ARM  Final   Special Requests    Final    BOTTLES DRAWN AEROBIC AND ANAEROBIC Blood Culture adequate volume   Culture   Final    NO GROWTH 5 DAYS Performed at [facility], [address] [place], [place] [address]    Report Status 09/25/2023 FINAL  Final  Culture, blood (Routine X 2) w Reflex to ID Panel     Status: None   Collection Time: 09/20/23  4:55 PM   Specimen: BLOOD  Result Value Ref Range Status   Specimen Description BLOOD BLOOD RIGHT HAND  Final   Special Requests   Final    BOTTLES DRAWN AEROBIC AND ANAEROBIC Blood Culture adequate volume   Culture   Final    NO GROWTH 5 DAYS Performed at [facility], [address] [place], [place] [address]    Report Status 09/25/2023 FINAL  Final    Radiology Studies: [place] EKG SITE RITE Result Date: 09/26/2023 If Site Rite image not attached, placement could not be confirmed due to current cardiac rhythm.  ECHO TEE Result Date: 09/25/2023    TRANSESOPHOGEAL ECHO REPORT   Patient Name:   NAYAN PROCH Bannister Date of Exam: 09/25/2023 Medical Rec #:  [id number]      Height:       68.0 in Accession #:    [id number]     Weight:       203.7 lb Date of Birth:  07-31-1951       BSA:          2.060 m Patient Age:    72 years       BP:           108/63 mmHg Patient Gender: M              HR:           84 bpm. Exam Location:  Inpatient Procedure: Transesophageal Echo, Cardiac Doppler and Color Doppler Indications:     Bacteremia  History:         Patient has prior history of Echocardiogram examinations, most                  recent 09/20/2023. Aortic Valve Disease, Arrythmias:Atrial                  Fibrillation and Tachycardia; Risk Factors:Hypertension,                  Diabetes, Dyslipidemia, Sleep Apnea and Former Smoker.  Sonographer:     Lucendia Herrlich RCS Referring Phys:  [id number] [facility] Diagnosing Phys: Arvilla Meres MD PROCEDURE: After discussion of the risks and benefits of a TEE, an informed consent was obtained from the patient. The transesophogeal probe  was passed without  difficulty through the esophogus of the patient. Imaged were obtained with the patient in a left lateral decubitus position. Sedation performed by different physician. The patient was monitored while under deep sedation. Anesthestetic sedation was provided intravenously by Anesthesiology: 207.38mg  of Propofol, 20mg  of Lidocaine. The patient developed no complications during the procedure.  IMPRESSIONS  1. Left ventricular ejection fraction, by estimation, is 60 to 65%. The left ventricle has normal function.  2. Right ventricular systolic function is normal. The right ventricular size is normal.  3. Left atrial size was moderately dilated. No left atrial/left atrial appendage thrombus was detected.  4. The mitral valve is normal in structure. Mild mitral valve regurgitation.  5. The aortic valve is tricuspid. There is moderate calcification of the aortic valve. Aortic valve regurgitation is trivial. Moderate aortic valve stenosis. Aortic valve mean gradient measures 28.0 mmHg. Aortic valve Vmax measures 3.34 m/s.  6. There is mild (Grade II) layered plaque involving the descending aorta. Conclusion(s)/Recommendation(s): No evidence of vegetation/infective endocarditis on this transesophageael echocardiogram. FINDINGS  Left Ventricle: Left ventricular ejection fraction, by estimation, is 60 to 65%. The left ventricle has normal function. The left ventricular internal cavity size was normal in size. Right Ventricle: The right ventricular size is normal. No increase in right ventricular Rendell thickness. Right ventricular systolic function is normal. Left Atrium: Left atrial size was moderately dilated. No left atrial/left atrial appendage thrombus was detected. Right Atrium: Right atrial size was normal in size. Pericardium: There is no evidence of pericardial effusion. Mitral Valve: The mitral valve is normal in structure. Mild mitral valve regurgitation. Tricuspid Valve: The tricuspid valve is normal in structure.  Tricuspid valve regurgitation is mild. Aortic Valve: The aortic valve is tricuspid. There is moderate calcification of the aortic valve. Aortic valve regurgitation is trivial. Moderate aortic stenosis is present. Aortic valve mean gradient measures 28.0 mmHg. Aortic valve peak gradient measures 44.6 mmHg. Pulmonic Valve: The pulmonic valve was normal in structure. Pulmonic valve regurgitation is trivial. Aorta: The aortic root and ascending aorta are structurally normal, with no evidence of dilitation. There is mild (Grade II) layered plaque involving the descending aorta. IAS/Shunts: The interatrial septum appears to be lipomatous. No atrial level shunt detected by color flow Doppler. Additional Comments: Spectral Doppler performed. AORTIC VALVE AV Vmax:      334.00 cm/s AV Vmean:     252.000 cm/s AV VTI:       0.922 m AV Peak Grad: 44.6 mmHg AV Mean Grad: 28.0 mmHg Arvilla Meres MD Electronically signed by Arvilla Meres MD Signature Date/Time: 09/25/2023/5:27:34 PM    Final    Scheduled Meds:  apixaban  5 mg Oral BID   Chlorhexidine Gluconate Cloth  6 each Topical Daily   feeding supplement  237 mL Oral BID BM   fluconazole  400 mg Oral Daily   insulin aspart  0-15 Units Subcutaneous TID WC   insulin aspart  0-5 Units Subcutaneous QHS   insulin aspart  5 Units Subcutaneous TID WC   insulin glargine-yfgn  15 Units Subcutaneous Daily   metoprolol succinate  25 mg Oral Daily   pantoprazole (PROTONIX) IV  40 mg Intravenous Q12H   phosphorus  500 mg Oral Once   sodium chloride flush  3 mL Intravenous Q12H   Continuous Infusions:  cefTRIAXone (ROCEPHIN)  IV 2 g (09/25/23 2134)   magnesium sulfate bolus IVPB      LOS: 6 days   Marguerita Merles, DO [facility] Available via  Epic secure chat 7am-7pm After these hours, please refer to coverage provider listed on [email or web address] 09/26/2023, 8:10 PM

## 2023-09-26 NOTE — TOC Progression Note (Addendum)
Transition of Care [facility]) - Progression Note    Patient Details  Name: Tony Cantu MRN: [id number] Date of Birth: 06/12/1951  [facility] CM/SW Contact  Delilah Shan, LCSWA Phone Number: 09/26/2023, 1:33 PM  Clinical Narrative:     CSW provided SNF bed offers to patient. Patient accepted SNF bed offer with [facility]. Patient had questions regarding HCPOA. CSW paged chaplain. [employer] returned page and informed CSW chaplain will follow up with patient shortly. CSW informed patient. All questions answered No further questions reported at this time. CSW LVM for Jill Side with [employer]. CSW awaiting call back to confirm bed for patient.   Update- Alison with [facility] rehab confirmed they can accept patient on IV antibiotics when ready for dc. [facility] confirmed they will start insurance authorization today. CSW informed MD. Jill Side informed CSW currently ward room is available but can be put on list to be moved to private room when available. CSW informed patient. Patient agreeable to sharing room and request to be put on list to be moved to private room when available. CSW informed Jill Side with [employer].  Expected Discharge Plan: Skilled Nursing Facility Barriers to Discharge: Continued Medical Work up  Expected Discharge Plan and Services In-house Referral: Clinical Social Work     Living arrangements for the past 2 months: Single Family Home                                       Social Determinants of Health (SDOH) Interventions SDOH Screenings   Food Insecurity: Patient Unable To Answer (09/20/2023)  Housing: Low Risk  (09/22/2023)  Transportation Needs: Patient Unable To Answer (09/20/2023)  Utilities: Patient Unable To Answer (09/20/2023)  Depression (PHQ2-9): Medium Risk (09/13/2023)  Financial Resource Strain: Low Risk  (06/09/2022)  Social Connections: Patient Unable To Answer (09/20/2023)  Tobacco Use: Medium Risk (09/25/2023)    Readmission Risk  Interventions     No data to display

## 2023-09-26 NOTE — Progress Notes (Signed)
Chaplain responded to request for pt education on HPCOA.  Chart notes indicate the CSW explained the document to pt's satisfaction.  When Chaplain visited pt seemed confused, stating he hadn't had dinner, which he had.    Chaplain learned pt wants his former wife Tony Cantu to be his HCPOA, even though they are divorced.  Pt states he doesn't trust the woman he's been living with b/c she only wants to inherit from him so she can take care of her grandchildren.   Chaplain checked pt's chart to learn no next-of-kin listed per se...there is a notation of Tony Cantu.  Chaplain requests Tony Cantu name and phone number [phone]) be listed as person to call concerning pt's medical needs.    Vernell Morgans Chaplain

## 2023-09-26 NOTE — Progress Notes (Signed)
[facility] Infectious Diseases Follow Up Note  Patient Identification: Patient Name: Tony Cantu MRN: [id number] Admit Date: 09/19/2023  5:04 PM Age: 73 y.o.Today's Date: 09/26/2023  Reason for Visit: Streptococcus salivarius bacteremia  Principal Problem:   Sepsis [facility]) Active Problems:   Stroke-like symptom   Acute encephalopathy   Transaminitis   Renal lesion   Melena   Bacteremia   Other cirrhosis of liver (HCC)   Abnormal finding on GI tract imaging   Esophageal candidiasis (HCC)   Antibiotics: Ceftriaxone 1/22-  Fluconazole 1/27-  Lines/Hardwares: Right shoulder arthroplasty, left partial soldier surgery, bilateral knee arthroplasty, hypoglossal nerve stimulator  Interval Events: Remains afebrile, CBC and CMP with no significant abnormality    Assessment 73 year old male with multiple comorbidities as below including DM type I, liver cirrhosis ( 09/22/23 acute hepatitis panel negative), aortic stenosis admitted with  # Streptococcus salivarius bacteremia (penicillin intermediate) Blood cx 1/22 NG final 1/22 TTE and 1/27 TEE no vegetations  CT abdomen/pelvis mild left greater than right perinephric stranding with possible areas of cortical hypoenhancement involving the kidneys ( could be septic infarcts)  # Esophageal candidiasis EGD 1/27 -Esophageal plaques consistent with candidiasis.  Gastric antral vascular ectasia without bleeding.  Portal hypertensive gastropathy.  Normal examined duodenum.  # Anemia/melena - s/p EGD as above   # Thrombocytopenia/Tranasminitis - improving   # Aortic stenosis, A fin - Cardiology managing   Recommendations -Complete 4 weeks of IV ceftriaxone due to possible septic renal infarcts although TEE negative. EOT 10/18/23 -PICC -Complete 3 weeks of PO fluconazole. 09/19/23 qtc 500, monitor - Monitor CBC and CMP on abtx - ID will so, please recall back with questions or  concerns  OPAT  Diagnosis: strep salivarius bacteremia with possible renal infarcts  Culture Result: as above  Allergies  Allergen Reactions   [employer] [facility] Cold-Cough Other (See Comments)    Chest discomfort    Erythromycin Diarrhea   Sulfonamide Derivatives Diarrhea    OPAT Orders Discharge antibiotics to be given via PICC line Discharge antibiotics: ceftriaxone 2g iv daily Per pharmacy protocol  Duration: 4 weeks  End Date: 10/18/23  [facility] Per Protocol:  Home health RN for IV administration and teaching; PICC line care and labs.    Labs weekly while on IV antibiotics: X__ CBC with differential __ BMP X__ CMP __ CRP __ ESR __ Vancomycin trough __ CK  X__ Please pull PIC at completion of IV antibiotics __ Please leave PIC in place until doctor has seen patient or been notified  Fax weekly labs to [phone]  Clinic Follow Up Appt: 2/11 at 3 pm   Rest of the management as per the primary team. Thank you for the consult. Please page with pertinent questions or concerns.  ______________________________________________________________________ Subjective patient seen and examined at the bedside. No complaints. Reports he was supposed to have teeth removed today but denies dental or gum pain  Past Medical History:  Diagnosis Date   Aortic atherosclerosis (HCC)    Aortic stenosis    Arthritis    Asthma    BPH (benign prostatic hypertrophy)    Cirrhosis (HCC) 2016   Colon polyps    Diabetes mellitus without complication (HCC)    TYPE 1    Diverticulosis    Dysrhythmia 04/19/2021   A-fib noted with possible A-fib RVR   Gastric ulcer    Gastric varices    right   Gastritis    GERD (gastroesophageal reflux disease)    Headache  History- pt states these were migraines that occurred in the 1980's   Heart murmur    History of kidney stones    Hx of adenomatous colonic polyps    Hypertension    Iron deficiency anemia due to chronic blood loss  08/16/2023   Portal hypertensive gastropathy (HCC)    Renal cyst, right    Seizures (HCC)    HYPOGLYCEMIC LAST 1 AND 1/2 YRS AGO   Sleep apnea    uses CPAP nightly   Testicle trouble    one testicle BORN WITH   Tubular adenoma of colon    Past Surgical History:  Procedure Laterality Date   BACK SURGERY     89  LOWER    CARPAL TUNNEL RELEASE Right 10/13/2015   Procedure: RIGHT CARPAL TUNNEL RELEASE;  Surgeon: Cindee Salt, MD;  Location: [facility];  Service: Orthopedics;  Laterality: Right;   CARPAL TUNNEL RELEASE Left 07/05/2016   Procedure: LEFT CARPAL TUNNEL RELEASE;  Surgeon: Cindee Salt, MD;  Location: [facility];  Service: Orthopedics;  Laterality: Left;   COLONOSCOPY WITH ESOPHAGOGASTRODUODENOSCOPY (EGD)  10/05/2022   DRUG INDUCED ENDOSCOPY N/A 07/02/2021   Procedure: DRUG INDUCED SLEEP ENDOSCOPY;  Surgeon: Christia Reading, MD;  Location: [facility] OR;  Service: ENT;  Laterality: N/A;   IMPLANTATION OF HYPOGLOSSAL NERVE STIMULATOR Right 10/01/2021   Procedure: IMPLANTATION OF HYPOGLOSSAL NERVE STIMULATOR;  Surgeon: Christia Reading, MD;  Location[facility];  Service: ENT;  Laterality: Right;   INGUINAL HERNIA REPAIR  2003   right    KNEE ARTHROSCOPY Right 03/03/2016   LUMBAR DISC SURGERY  03/1995   Dr. Daleen Squibb, discectomy   LUMBAR LAMINECTOMY/DECOMPRESSION MICRODISCECTOMY Right 10/06/2016   Procedure: RIGHT LUMBAR THREE - LUMBAR FOUR  LAMINECTOMY, FORAMINOTOMY AND MICRODISCECTOMY;  Surgeon: Shirlean Kelly, MD;  Location: [facility] OR;  Service: Neurosurgery;  Laterality: Right;   SHOULDER SURGERY  11/28/2005   left partial   SHOULDER SURGERY  07/14/2006   RIGHT   TONSILLECTOMY  AGE 8 OR 4   TOTAL KNEE ARTHROPLASTY Right 03/07/2022   Procedure: RIGHT TOTAL KNEE ARTHROPLASTY;  Surgeon: Gean Birchwood, MD;  Location: [facility];  Service: Orthopedics;  Laterality: Right;   TOTAL KNEE ARTHROPLASTY Left 06/06/2022   Procedure: LEFT TOTAL KNEE ARTHROPLASTY;  Surgeon: Gean Birchwood, MD;  Location: [facility];  Service: Orthopedics;  Laterality: Left;   TOTAL SHOULDER ARTHROPLASTY Right 11/01/2018   Procedure: RIGHT SHOULDER REVISION TO REVERSE TOTAL SHOULDER;  Surgeon: Jones Broom, MD;  Location: [facility];  Service: Orthopedics;  Laterality: Right;  CHOICE ANESTHESIA WITH INTERSCALENE BLOCK EXPAREL, NEEDS RNFA    Vitals BP 111/66 (BP Location: Right Arm)   Pulse 66   Temp 98.3 F (36.8 C) (Oral)   Resp 16   Ht 5\' 8"  (1.727 m)   Wt 92.4 kg   SpO2 94%   BMI 30.97 kg/m     Physical Exam Constitutional:  elderly male sitting in the recliner, not in acute distress     Comments: HEENT wnl  Cardiovascular:     Rate and Rhythm: Normal rate and regular rhythm.     Heart sounds: s1s2  Pulmonary:     Effort: Pulmonary effort is normal.     Comments: Normal lung sounds   Abdominal:     Palpations: Abdomen is soft.     Tenderness: non distended and nontender  Musculoskeletal:        General: No swelling or tenderness in peripheral joints including prosthetic knees  Skin:  Comments: no rashes  Neurological:     General: awake, alert and oriented, follows commands   Psychiatric:        Mood and Affect: Mood normal.   Pertinent Microbiology Results for orders placed or performed during the hospital encounter of 09/19/23  Blood culture (routine x 2)     Status: Abnormal   Collection Time: 09/19/23  6:48 PM   Specimen: BLOOD LEFT HAND  Result Value Ref Range Status   Specimen Description   Final    BLOOD LEFT HAND Performed at [facility], [address] [place], [place] [address]    Special Requests   Final    BOTTLES DRAWN AEROBIC ONLY Blood Culture results may not be optimal due to an inadequate volume of blood received in culture bottles Performed at [facility], [address] [place], [place] [address]    Culture  Setup Time   Final    GRAM POSITIVE COCCI AEROBIC BOTTLE ONLY Gram Stain Report Called to,Read Back By and Verified  With: KRISTY EDWARDS AT 0859 09/20/2023 BY A. SNYDER GRAM STAIN REVIEWED-AGREE WITH RESULT DRT    Culture (A)  Final    STREPTOCOCCUS SALIVARIUS SUSCEPTIBILITIES PERFORMED ON PREVIOUS CULTURE WITHIN THE LAST 5 DAYS. Performed at [facility], [address], [place], [place] [address]    Report Status 09/22/2023 FINAL  Final  Blood culture (routine x 2)     Status: Abnormal   Collection Time: 09/19/23  6:48 PM   Specimen: BLOOD LEFT HAND  Result Value Ref Range Status   Specimen Description   Final    BLOOD LEFT HAND Performed at [facility], [address], [place], [place] [address]    Special Requests   Final    BOTTLES DRAWN AEROBIC ONLY Blood Culture results may not be optimal due to an inadequate volume of blood received in culture bottles Performed at [facility], [address] [place], [place] [address]    Culture  Setup Time   Final    GRAM POSITIVE COCCI AEROBIC BOTTLE ONLY Gram Stain Report Called to,Read Back By and Verified With: KRISTY EDWARDS AT 0859 09/20/2023 BY A. SNYDER GRAM STAIN REVIEWED-AGREE WITH RESULT DRT CRITICAL RESULT CALLED TO, READ BACK BY AND VERIFIED WITH: PHARMD F.WILSON [id number] @ 1518 FH Performed at [facility], [address], [place], [place] [address]    Culture STREPTOCOCCUS SALIVARIUS (A)  Final   Report Status 09/22/2023 FINAL  Final   Organism ID, Bacteria STREPTOCOCCUS SALIVARIUS  Final      Susceptibility   Streptococcus salivarius - MIC*    PENICILLIN 0.25 INTERMEDIATE Intermediate     CEFTRIAXONE <=0.12 SENSITIVE Sensitive     ERYTHROMYCIN 2 RESISTANT Resistant     LEVOFLOXACIN 2 SENSITIVE Sensitive     VANCOMYCIN 1 SENSITIVE Sensitive     * STREPTOCOCCUS SALIVARIUS  Blood Culture ID Panel (Reflexed)     Status: Abnormal   Collection Time: 09/19/23  6:48 PM  Result Value Ref Range Status   Enterococcus faecalis NOT DETECTED NOT DETECTED Final   Enterococcus Faecium NOT DETECTED NOT DETECTED Final    Listeria monocytogenes NOT DETECTED NOT DETECTED Final   Staphylococcus species NOT DETECTED NOT DETECTED Final   Staphylococcus aureus (BCID) NOT DETECTED NOT DETECTED Final   Staphylococcus epidermidis NOT DETECTED NOT DETECTED Final   Staphylococcus lugdunensis NOT DETECTED NOT DETECTED Final   Streptococcus species DETECTED (A) NOT DETECTED Final    Comment: Not  Enterococcus species, Streptococcus agalactiae, Streptococcus pyogenes, or Streptococcus pneumoniae. CRITICAL RESULT CALLED TO, READ BACK BY AND VERIFIED WITH: PHARMD F.WILSON [id number] @ 1518 FH    Streptococcus agalactiae NOT DETECTED NOT DETECTED Final   Streptococcus pneumoniae NOT DETECTED NOT DETECTED Final   Streptococcus pyogenes NOT DETECTED NOT DETECTED Final   A.calcoaceticus-baumannii NOT DETECTED NOT DETECTED Final   Bacteroides fragilis NOT DETECTED NOT DETECTED Final   Enterobacterales NOT DETECTED NOT DETECTED Final   Enterobacter cloacae complex NOT DETECTED NOT DETECTED Final   Escherichia coli NOT DETECTED NOT DETECTED Final   Klebsiella aerogenes NOT DETECTED NOT DETECTED Final   Klebsiella oxytoca NOT DETECTED NOT DETECTED Final   Klebsiella pneumoniae NOT DETECTED NOT DETECTED Final   Proteus species NOT DETECTED NOT DETECTED Final   Salmonella species NOT DETECTED NOT DETECTED Final   Serratia marcescens NOT DETECTED NOT DETECTED Final   Haemophilus influenzae NOT DETECTED NOT DETECTED Final   Neisseria meningitidis NOT DETECTED NOT DETECTED Final   Pseudomonas aeruginosa NOT DETECTED NOT DETECTED Final   Stenotrophomonas maltophilia NOT DETECTED NOT DETECTED Final   Candida albicans NOT DETECTED NOT DETECTED Final   Candida auris NOT DETECTED NOT DETECTED Final   Candida glabrata NOT DETECTED NOT DETECTED Final   Candida krusei NOT DETECTED NOT DETECTED Final   Candida parapsilosis NOT DETECTED NOT DETECTED Final   Candida tropicalis NOT DETECTED NOT DETECTED Final   Cryptococcus neoformans/gattii  NOT DETECTED NOT DETECTED Final    Comment: Performed at [facility], [address], [place], [place] [address]  Resp panel by RT-PCR (RSV, Flu A&B, Covid) Anterior Nasal Swab     Status: None   Collection Time: 09/19/23  7:30 PM   Specimen: Anterior Nasal Swab  Result Value Ref Range Status   SARS Coronavirus 2 by RT PCR NEGATIVE NEGATIVE Final    Comment: (NOTE) SARS-CoV-2 target nucleic acids are NOT DETECTED.  The SARS-CoV-2 RNA is generally detectable in upper respiratory specimens during the acute phase of infection. The lowest concentration of SARS-CoV-2 viral copies this assay can detect is 138 copies/mL. A negative result does not preclude SARS-Cov-2 infection and should not be used as the sole basis for treatment or other patient management decisions. A negative result may occur with  improper specimen collection/handling, submission of specimen other than nasopharyngeal swab, presence of viral mutation(s) within the areas targeted by this assay, and inadequate number of viral copies(<138 copies/mL). A negative result must be combined with clinical observations, patient history, and epidemiological information. The expected result is Negative.  Fact Sheet for Patients:  BloggerCourse.com  Fact Sheet for Healthcare Providers:  SeriousBroker.it  This test is no t yet approved or cleared by the [place] FDA and  has been authorized for detection and/or diagnosis of SARS-CoV-2 by FDA under an Emergency Use Authorization (EUA). This EUA will remain  in effect (meaning this test can be used) for the duration of the COVID-19 declaration under Section 564(b)(1) of the Act, 21 U.S.C.section 360bbb-3(b)(1), unless the authorization is terminated  or revoked sooner.       Influenza A by PCR NEGATIVE NEGATIVE Final   Influenza B by PCR NEGATIVE NEGATIVE Final    Comment: (NOTE) The Xpert Xpress SARS-CoV-2/FLU/RSV  plus assay is intended as an aid in the diagnosis of influenza from Nasopharyngeal swab specimens and should not be used as a sole basis for treatment. Nasal washings and aspirates are unacceptable for Xpert Xpress SARS-CoV-2/FLU/RSV testing.  Fact Sheet for Patients:  BloggerCourse.com  Fact Sheet for Healthcare Providers: SeriousBroker.it  This test is not yet approved or cleared by the [place] FDA and has been authorized for detection and/or diagnosis of SARS-CoV-2 by FDA under an Emergency Use Authorization (EUA). This EUA will remain in effect (meaning this test can be used) for the duration of the COVID-19 declaration under Section 564(b)(1) of the Act, 21 U.S.C. section 360bbb-3(b)(1), unless the authorization is terminated or revoked.     Resp Syncytial Virus by PCR NEGATIVE NEGATIVE Final    Comment: (NOTE) Fact Sheet for Patients: BloggerCourse.com  Fact Sheet for Healthcare Providers: SeriousBroker.it  This test is not yet approved or cleared by the [place] FDA and has been authorized for detection and/or diagnosis of SARS-CoV-2 by FDA under an Emergency Use Authorization (EUA). This EUA will remain in effect (meaning this test can be used) for the duration of the COVID-19 declaration under Section 564(b)(1) of the Act, 21 U.S.C. section 360bbb-3(b)(1), unless the authorization is terminated or revoked.  Performed at [facility], [address], [place], [place] [address]   Respiratory (~20 pathogens) panel by PCR     Status: None   Collection Time: 09/20/23  7:20 AM   Specimen: Nasopharyngeal Swab; Respiratory  Result Value Ref Range Status   Adenovirus NOT DETECTED NOT DETECTED Final   Coronavirus 229E NOT DETECTED NOT DETECTED Final    Comment: (NOTE) The Coronavirus on the Respiratory Panel, DOES NOT test for the novel  Coronavirus (2019 nCoV)     Coronavirus HKU1 NOT DETECTED NOT DETECTED Final   Coronavirus NL63 NOT DETECTED NOT DETECTED Final   Coronavirus OC43 NOT DETECTED NOT DETECTED Final   Metapneumovirus NOT DETECTED NOT DETECTED Final   Rhinovirus / Enterovirus NOT DETECTED NOT DETECTED Final   Influenza A NOT DETECTED NOT DETECTED Final   Influenza B NOT DETECTED NOT DETECTED Final   Parainfluenza Virus 1 NOT DETECTED NOT DETECTED Final   Parainfluenza Virus 2 NOT DETECTED NOT DETECTED Final   Parainfluenza Virus 3 NOT DETECTED NOT DETECTED Final   Parainfluenza Virus 4 NOT DETECTED NOT DETECTED Final   Respiratory Syncytial Virus NOT DETECTED NOT DETECTED Final   Bordetella pertussis NOT DETECTED NOT DETECTED Final   Bordetella Parapertussis NOT DETECTED NOT DETECTED Final   Chlamydophila pneumoniae NOT DETECTED NOT DETECTED Final   Mycoplasma pneumoniae NOT DETECTED NOT DETECTED Final    Comment: Performed at [facility], [address], [place], [place] [address]  MRSA Next Gen by PCR, Nasal     Status: None   Collection Time: 09/20/23 12:35 PM   Specimen: Nasal Mucosa; Nasal Swab  Result Value Ref Range Status   MRSA by PCR Next Gen NOT DETECTED NOT DETECTED Final    Comment: (NOTE) The GeneXpert MRSA Assay (FDA approved for NASAL specimens only), is one component of a comprehensive MRSA colonization surveillance program. It is not intended to diagnose MRSA infection nor to guide or monitor treatment for MRSA infections. Test performance is not FDA approved in patients less than 48 years old. Performed at [facility], [address], [place], [place] [address]   Culture, blood (Routine X 2) w Reflex to ID Panel     Status: None   Collection Time: 09/20/23  4:55 PM   Specimen: BLOOD  Result Value Ref Range Status   Specimen Description BLOOD BLOOD LEFT ARM  Final   Special Requests   Final    BOTTLES DRAWN AEROBIC AND ANAEROBIC Blood Culture adequate  volume   Culture   Final    NO GROWTH 5  DAYS Performed at [facility], [address], [place], [place] [address]    Report Status 09/25/2023 FINAL  Final  Culture, blood (Routine X 2) w Reflex to ID Panel     Status: None   Collection Time: 09/20/23  4:55 PM   Specimen: BLOOD  Result Value Ref Range Status   Specimen Description BLOOD BLOOD RIGHT HAND  Final   Special Requests   Final    BOTTLES DRAWN AEROBIC AND ANAEROBIC Blood Culture adequate volume   Culture   Final    NO GROWTH 5 DAYS Performed at [facility], [address], [place], [place] [address]    Report Status 09/25/2023 FINAL  Final   *Note: Due to a large number of results and/or encounters for the requested time period, some results have not been displayed. A complete set of results can be found in Results Review.   Pertinent Lab.    Latest Ref Rng & Units 09/25/2023    1:18 PM 09/24/2023    3:56 AM 09/23/2023    4:27 AM  CBC  WBC 4.0 - 10.5 K/uL 7.6  4.8  4.1   Hemoglobin 13.0 - 17.0 g/dL 82.9  9.6  9.5   Hematocrit 39.0 - 52.0 % 32.9  28.0  29.2   Platelets 150 - 400 K/uL 104  93  87       Latest Ref Rng & Units 09/25/2023   12:17 PM 09/24/2023    3:56 AM 09/23/2023    4:27 AM  CMP  Glucose 70 - 99 mg/dL 562  130  865   BUN 8 - 23 mg/dL 10  13  17    Creatinine 0.61 - 1.24 mg/dL 7.84  6.96  2.95   Sodium 135 - 145 mmol/L 138  136  136   Potassium 3.5 - 5.1 mmol/L 4.3  3.3  3.6   Chloride 98 - 111 mmol/L 107  106  108   CO2 22 - 32 mmol/L 17  22  22    Calcium 8.9 - 10.3 mg/dL 8.9  8.4  8.3   Total Protein 6.5 - 8.1 g/dL 6.1  5.5  5.2   Total Bilirubin 0.0 - 1.2 mg/dL 1.0  0.6  0.5   Alkaline Phos 38 - 126 U/L 85  77  73   AST 15 - 41 U/L 77  92  128   ALT 0 - 44 U/L 71  75  82      Pertinent Imaging today Plain films and CT images have been personally visualized and interpreted; radiology reports have been reviewed. Decision making incorporated into the Impression /   ECHO TEE Result Date: 09/25/2023    TRANSESOPHOGEAL ECHO REPORT    Patient Name:   KAHLEEL FADELEY Rathbun Date of Exam: 09/25/2023 Medical Rec #:  [id number]      Height:       68.0 in Accession #:    [id number]     Weight:       203.7 lb Date of Birth:  1950-12-13       BSA:          2.060 m Patient Age:    72 years       BP:           108/63 mmHg Patient Gender: M              HR:  84 bpm. Exam Location:  Inpatient Procedure: Transesophageal Echo, Cardiac Doppler and Color Doppler Indications:     Bacteremia  History:         Patient has prior history of Echocardiogram examinations, most                  recent 09/20/2023. Aortic Valve Disease, Arrythmias:Atrial                  Fibrillation and Tachycardia; Risk Factors:Hypertension,                  Diabetes, Dyslipidemia, Sleep Apnea and Former Smoker.  Sonographer:     Lucendia Herrlich RCS Referring Phys:  [id number] [facility] Diagnosing Phys: Arvilla Meres MD PROCEDURE: After discussion of the risks and benefits of a TEE, an informed consent was obtained from the patient. The transesophogeal probe was passed without difficulty through the esophogus of the patient. Imaged were obtained with the patient in a left lateral decubitus position. Sedation performed by different physician. The patient was monitored while under deep sedation. Anesthestetic sedation was provided intravenously by Anesthesiology: 207.38mg  of Propofol, 20mg  of Lidocaine. The patient developed no complications during the procedure.  IMPRESSIONS  1. Left ventricular ejection fraction, by estimation, is 60 to 65%. The left ventricle has normal function.  2. Right ventricular systolic function is normal. The right ventricular size is normal.  3. Left atrial size was moderately dilated. No left atrial/left atrial appendage thrombus was detected.  4. The mitral valve is normal in structure. Mild mitral valve regurgitation.  5. The aortic valve is tricuspid. There is moderate calcification of the aortic valve. Aortic valve regurgitation is trivial. Moderate  aortic valve stenosis. Aortic valve mean gradient measures 28.0 mmHg. Aortic valve Vmax measures 3.34 m/s.  6. There is mild (Grade II) layered plaque involving the descending aorta. Conclusion(s)/Recommendation(s): No evidence of vegetation/infective endocarditis on this transesophageael echocardiogram. FINDINGS  Left Ventricle: Left ventricular ejection fraction, by estimation, is 60 to 65%. The left ventricle has normal function. The left ventricular internal cavity size was normal in size. Right Ventricle: The right ventricular size is normal. No increase in right ventricular Korn thickness. Right ventricular systolic function is normal. Left Atrium: Left atrial size was moderately dilated. No left atrial/left atrial appendage thrombus was detected. Right Atrium: Right atrial size was normal in size. Pericardium: There is no evidence of pericardial effusion. Mitral Valve: The mitral valve is normal in structure. Mild mitral valve regurgitation. Tricuspid Valve: The tricuspid valve is normal in structure. Tricuspid valve regurgitation is mild. Aortic Valve: The aortic valve is tricuspid. There is moderate calcification of the aortic valve. Aortic valve regurgitation is trivial. Moderate aortic stenosis is present. Aortic valve mean gradient measures 28.0 mmHg. Aortic valve peak gradient measures 44.6 mmHg. Pulmonic Valve: The pulmonic valve was normal in structure. Pulmonic valve regurgitation is trivial. Aorta: The aortic root and ascending aorta are structurally normal, with no evidence of dilitation. There is mild (Grade II) layered plaque involving the descending aorta. IAS/Shunts: The interatrial septum appears to be lipomatous. No atrial level shunt detected by color flow Doppler. Additional Comments: Spectral Doppler performed. AORTIC VALVE AV Vmax:      334.00 cm/s AV Vmean:     252.000 cm/s AV VTI:       0.922 m AV Peak Grad: 44.6 mmHg AV Mean Grad: 28.0 mmHg Arvilla Meres MD Electronically signed by  Arvilla Meres MD Signature Date/Time: 09/25/2023/5:27:34 PM    Final  MR BRAIN WO CONTRAST Result Date: 09/20/2023 CLINICAL DATA:  Encephalopathy. Right-sided facial, arm, and leg weakness. Possible stroke. EXAM: MRI HEAD WITHOUT CONTRAST TECHNIQUE: Multiplanar, multiecho pulse sequences of the brain and surrounding structures were obtained without intravenous contrast. COMPARISON:  Head CT 09/20/2023 FINDINGS: Despite multiple attempts, the examination had to be discontinued prior to completion as the patient attempted to remove the head coil. Axial and coronal diffusion weighted imaging and a sagittal T1 sequence were obtained and suffer from variable, up to severe motion artifact. Brain: A repeated axial DWI sequence is of reasonable quality with only mild motion artifact, and there is no evidence of an acute infarct. No significant intracranial mass effect or sizable extra-axial collection is evident on this limited study. Patchy T2 hyperintensities in the cerebral white matter are incompletely evaluated and nonspecific but most commonly seen with chronic small vessel ischemia. A chronic lacunar infarct or dilated perivascular space is noted in the posterior right frontal white matter. Vascular: Not assessed on this limited study. Skull and upper cervical spine: No destructive skull lesion. Sinuses/Orbits: Grossly clear paranasal sinuses. Limited assessment of the orbits. Other: None. IMPRESSION: 1. Incomplete, motion degraded examination. 2. No acute infarct. Electronically Signed   By: Sebastian Ache M.D.   On: 09/20/2023 15:19   ECHOCARDIOGRAM COMPLETE BUBBLE STUDY Result Date: 09/20/2023    ECHOCARDIOGRAM REPORT   Patient Name:   Tony Cantu Date of Exam: 09/20/2023 Medical Rec #:  [id number]      Height:       68.0 in Accession #:    [id number]     Weight:       209.4 lb Date of Birth:  06/13/1951       BSA:          2.084 m Patient Age:    72 years       BP:           99/57 mmHg Patient Gender: M               HR:           66 bpm. [facility] Procedure: 2D Echo, Cardiac Doppler and Color Doppler STAT ECHO Indications:    Sepsis  History:        Patient has prior history of Echocardiogram examinations, most                 recent 09/22/2021. COPD, Arrythmias:Atrial Fibrillation and                 Tachycardia; Risk Factors:Hypertension, Diabetes, Dyslipidemia                 and Former Smoker.  Sonographer:    Vern Claude Referring Phys: [id number] JONATHAN SEGARS IMPRESSIONS  1. The aortic valve is calcified. Possibly tricuspid. There is severe calcifcation of the aortic valve. There is severe thickening of the aortic valve. Aortic valve regurgitation is trivial. Severe aortic valve stenosis. Aortic valve area, by VTI measures 0.89 cm. Aortic valve mean gradient measures 46.3 mmHg. Aortic valve Vmax measures 4.55 m/s. DVI is 0.27.  2. Left ventricular ejection fraction, by estimation, is 55 to 60%. The left ventricle has normal function. Left ventricular endocardial border not optimally defined to evaluate regional Busbin motion. The left ventricular internal cavity size was mildly to moderately dilated. Left ventricular diastolic parameters are consistent with Grade I diastolic dysfunction (impaired relaxation).  3. Right ventricular systolic function is normal. The right ventricular size is  normal. Tricuspid regurgitation signal is inadequate for assessing PA pressure.  4. The mitral valve is abnormal. Trivial mitral valve regurgitation. Mild mitral stenosis. The mean mitral valve gradient is 3.0 mmHg. Moderate mitral annular calcification.  5. The inferior vena cava is normal in size with greater than 50% respiratory variability, suggesting right atrial pressure of 3 mmHg. Comparison(s): Changes from prior study are noted. Aortic valve stensois progressed from moderate (in 2023) to severe now. V max is 4.55 m/s and mean PG 46.3 mm Hg. FINDINGS  Left Ventricle: Left ventricular ejection fraction,  by estimation, is 55 to 60%. The left ventricle has normal function. Left ventricular endocardial border not optimally defined to evaluate regional Kinne motion. The left ventricular internal cavity size was mildly to moderately dilated. There is no left ventricular hypertrophy. Left ventricular diastolic parameters are consistent with Grade I diastolic dysfunction (impaired relaxation). Normal left ventricular filling pressure. Right Ventricle: The right ventricular size is normal. No increase in right ventricular [place] thickness. Right ventricular systolic function is normal. Tricuspid regurgitation signal is inadequate for assessing PA pressure. Left Atrium: Left atrial size was normal in size. Right Atrium: Right atrial size was normal in size. Pericardium: There is no evidence of pericardial effusion. Mitral Valve: The mitral valve is abnormal. Moderate mitral annular calcification. Trivial mitral valve regurgitation. Mild mitral valve stenosis. MV peak gradient, 5.8 mmHg. The mean mitral valve gradient is 3.0 mmHg. Tricuspid Valve: The tricuspid valve is grossly normal. Tricuspid valve regurgitation is not demonstrated. No evidence of tricuspid stenosis. Aortic Valve: The aortic valve is calcified. There is severe calcifcation of the aortic valve. There is severe thickening of the aortic valve. Aortic valve regurgitation is trivial. Severe aortic stenosis is present. Aortic valve mean gradient measures 46.3 mmHg. Aortic valve peak gradient measures 82.9 mmHg. Aortic valve area, by VTI measures 0.89 cm. Pulmonic Valve: The pulmonic valve was not well visualized. Pulmonic valve regurgitation is trivial. No evidence of pulmonic stenosis. Aorta: The aortic root and ascending aorta are structurally normal, with no evidence of dilitation. Venous: The inferior vena cava is normal in size with greater than 50% respiratory variability, suggesting right atrial pressure of 3 mmHg. IAS/Shunts: The interatrial septum was  not well visualized.  LEFT VENTRICLE PLAX 2D LVIDd:         6.00 cm      Diastology LVIDs:         4.20 cm      LV e' medial:    5.11 cm/s LV PW:         0.80 cm      LV E/e' medial:  19.3 LV IVS:        0.90 cm      LV e' lateral:   8.38 cm/s LVOT diam:     2.00 cm      LV E/e' lateral: 11.8 LV SV:         80 LV SV Index:   38 LVOT Area:     3.14 cm  LV Volumes (MOD) LV vol d, MOD A2C: 186.0 ml LV vol d, MOD A4C: 182.0 ml LV vol s, MOD A2C: 72.2 ml LV vol s, MOD A4C: 63.4 ml LV SV MOD A2C:     113.8 ml LV SV MOD A4C:     182.0 ml LV SV MOD BP:      119.1 ml RIGHT VENTRICLE             IVC RV Basal diam:  3.90 cm  IVC diam: 1.00 cm RV Mid diam:    2.50 cm RV S prime:     15.20 cm/s TAPSE (M-mode): 3.1 cm LEFT ATRIUM             Index        RIGHT ATRIUM           Index LA diam:        3.70 cm 1.78 cm/m   RA Area:     14.40 cm LA Vol (A2C):   91.6 ml 43.95 ml/m  RA Volume:   35.40 ml  16.99 ml/m LA Vol (A4C):   44.6 ml 21.40 ml/m LA Biplane Vol: 69.0 ml 33.11 ml/m  AORTIC VALVE                     PULMONIC VALVE AV Area (Vmax):    0.87 cm      PV Vmax:       0.98 m/s AV Area (Vmean):   0.86 cm      PV Peak grad:  3.8 mmHg AV Area (VTI):     0.89 cm AV Vmax:           455.33 cm/s AV Vmean:          310.000 cm/s AV VTI:            0.896 m AV Peak Grad:      82.9 mmHg AV Mean Grad:      46.3 mmHg LVOT Vmax:         126.00 cm/s LVOT Vmean:        85.000 cm/s LVOT VTI:          0.255 m LVOT/AV VTI ratio: 0.28  AORTA Ao Root diam: 3.20 cm Ao Asc diam:  3.20 cm MITRAL VALVE MV Area (PHT): 3.26 cm     SHUNTS MV Area VTI:   2.27 cm     Systemic VTI:  0.26 m MV Peak grad:  5.8 mmHg     Systemic Diam: 2.00 cm MV Mean grad:  3.0 mmHg MV Vmax:       1.20 m/s MV Vmean:      82.0 cm/s MV Decel Time: 233 msec MV E velocity: 98.80 cm/s MV A velocity: 118.00 cm/s MV E/A ratio:  0.84 Vishnu Priya Mallipeddi Electronically signed by Winfield Rast Mallipeddi Signature Date/Time: 09/20/2023/9:56:59 AM    Final    CT HEAD WO  CONTRAST ( ) Result Date: 09/20/2023 CLINICAL DATA:  Encephalopathy, right-sided weakness EXAM: CT HEAD WITHOUT CONTRAST TECHNIQUE: Contiguous axial images were obtained from the base of the skull through the vertex without intravenous contrast. RADIATION DOSE REDUCTION: This exam was performed according to the departmental dose-optimization program which includes automated exposure control, adjustment of the mA and/or kV according to patient size and/or use of iterative reconstruction technique. COMPARISON:  None Available. FINDINGS: Brain: Normal anatomic configuration of the brain. Mild periventricular white matter changes are present likely the sequela small vessel ischemia. Tiny remote lacunar infarct noted within the right frontal subcortical white matter. No acute intracranial hemorrhage or infarct. No mass effect or midline shift no abnormal intra or extra-axial mass lesion. Ventricular size is normal. Cerebellum is unremarkable. Vascular: No hyperdense vessel or unexpected calcification. Skull: Normal. Negative for fracture or focal lesion. Sinuses/Orbits: No acute finding. Other: Mastoid air cells and middle ear cavities are clear. IMPRESSION: 1. No acute intracranial abnormality. 2. Mild senescent change. 3. Tiny remote lacunar infarct within the right frontal subcortical white  matter. Electronically Signed   By: Helyn Numbers M.D.   On: 09/20/2023 01:45   CT CHEST ABDOMEN PELVIS W CONTRAST Result Date: 09/19/2023 CLINICAL DATA:  Sepsis fever EXAM: CT CHEST, ABDOMEN, AND PELVIS WITH CONTRAST TECHNIQUE: Multidetector CT imaging of the chest, abdomen and pelvis was performed following the standard protocol during bolus administration of intravenous contrast. RADIATION DOSE REDUCTION: This exam was performed according to the departmental dose-optimization program which includes automated exposure control, adjustment of the mA and/or kV according to patient size and/or use of iterative reconstruction  technique. CONTRAST:  OMNIPAQUE IOHEXOL 300 MG/ML  SOLN COMPARISON:  Chest x-ray 09/19/2023, CT 04/28/2021, 02/20/2019 FINDINGS: CT CHEST FINDINGS Cardiovascular: Mild aortic atherosclerosis. No aneurysm. Mitral annular calcification. Aortic valvular calcification. Borderline cardiomegaly. No pericardial effusion Mediastinum/Nodes: Patent trachea. No thyroid mass. No suspicious lymph nodes. Esophagus within normal limits Lungs/Pleura: No acute airspace disease, pleural effusion or pneumothorax. Musculoskeletal: Sternum appears intact. There are degenerative changes of the spine. No acute osseous abnormality CT ABDOMEN PELVIS FINDINGS Hepatobiliary: Liver cirrhosis. No calcified gallstone or biliary dilatation Pancreas: Poorly visible, presumably due to marked atrophy. No inflammation. Spleen: Normal in size without focal abnormality. Adrenals/Urinary Tract: Adrenal glands are normal. Kidneys show no hydronephrosis. Renal cysts for which no imaging follow-up is recommended. Mild left greater than right perinephric stranding. Possible areas of cortical hypoenhancement involving the kidneys on delayed views but limited by artifact from the patient's arms. There is also motion degradation. The bladder is unremarkable Stomach/Bowel: The stomach is nonenlarged. There is no dilated small bowel. No acute bowel Besecker thickening. Diverticular disease of the colon. Vascular/Lymphatic: Mild aortic atherosclerosis. No aneurysm. No suspicious lymph nodes. Gastroesophageal varices. Collateral vessels in the left upper quadrant. Reproductive: Prostate is unremarkable. Other: Negative for pelvic effusion or free air. Mild skin thickening of the right anterior abdominal [place] Musculoskeletal: Degenerative changes. No acute or suspicious osseous abnormality. IMPRESSION: 1. No CT evidence for acute intrathoracic abnormality. 2. Liver cirrhosis with evidence of portal hypertension including gastroesophageal varices. 3. Diverticular  disease of the colon without acute inflammatory process. 4. Mild left greater than right perinephric stranding with possible areas of cortical hypoenhancement involving the kidneys on delayed views but limited by artifact from the patient's arms and motion degradation. Correlate with urinalysis to assess for UTI/pyelonephritis Aortic Atherosclerosis (ICD10-I70.0). Electronically Signed   By: Jasmine Pang M.D.   On: 09/19/2023 21:24   DG Chest Portable 1 View Result Date: 09/19/2023 CLINICAL DATA:  Generalized weakness and fever today. EXAM: PORTABLE CHEST 1 VIEW COMPARISON:  03/10/2022 FINDINGS: The cardiac silhouette remains mildly enlarged. Decreased prominence of the pulmonary vasculature. Mildly prominent interstitial markings with an appearance compatible with chronic interstitial lung disease without superimposed pulmonary edema. No airspace consolidation or pleural fluid. Left shoulder prosthesis. Lower thoracic and upper lumbar spine degenerative changes. IMPRESSION: 1. No acute abnormality. 2. Mild cardiomegaly and mild chronic interstitial lung disease. Electronically Signed   By: Beckie Salts M.D.   On: 09/19/2023 18:24   I have personally spent 53 minutes involved in face-to-face and non-face-to-face activities for this patient on the day of the visit. Professional time spent includes the following activities: Preparing to see the patient (review of tests), Obtaining and/or reviewing separately obtained history (admission/discharge record), Performing a medically appropriate examination and/or evaluation , Ordering medications/tests/procedures, referring and communicating with other health care professionals, Documenting clinical information in the EMR, Independently interpreting results (not separately reported), Communicating results to the patient/family/caregiver, Counseling and educating the patient/family/caregiver and  Care coordination (not separately reported).   Plan d/w requesting  provider as well as ID pharm D  Of note, portions of this note may have been created with voice recognition software. While this note has been edited for accuracy, occasional wrong-word or 'sound-a-like' substitutions may have occurred due to the inherent limitations of voice recognition software.   Electronically signed by:   Odette Fraction, MD Infectious Disease Physician [facility] Pager: [phone]

## 2023-09-27 ENCOUNTER — Encounter (HOSPITAL_COMMUNITY): Payer: Self-pay | Admitting: Gastroenterology

## 2023-09-27 ENCOUNTER — Telehealth: Payer: Self-pay

## 2023-09-27 DIAGNOSIS — I2510 Atherosclerotic heart disease of native coronary artery without angina pectoris: Secondary | ICD-10-CM | POA: Diagnosis not present

## 2023-09-27 DIAGNOSIS — K5649 Other impaction of intestine: Secondary | ICD-10-CM | POA: Diagnosis not present

## 2023-09-27 DIAGNOSIS — I959 Hypotension, unspecified: Secondary | ICD-10-CM | POA: Diagnosis not present

## 2023-09-27 DIAGNOSIS — Z794 Long term (current) use of insulin: Secondary | ICD-10-CM | POA: Diagnosis not present

## 2023-09-27 DIAGNOSIS — A419 Sepsis, unspecified organism: Secondary | ICD-10-CM | POA: Diagnosis not present

## 2023-09-27 DIAGNOSIS — G9341 Metabolic encephalopathy: Secondary | ICD-10-CM | POA: Diagnosis not present

## 2023-09-27 DIAGNOSIS — K7469 Other cirrhosis of liver: Secondary | ICD-10-CM | POA: Diagnosis not present

## 2023-09-27 DIAGNOSIS — D500 Iron deficiency anemia secondary to blood loss (chronic): Secondary | ICD-10-CM | POA: Diagnosis not present

## 2023-09-27 DIAGNOSIS — Z87891 Personal history of nicotine dependence: Secondary | ICD-10-CM | POA: Diagnosis not present

## 2023-09-27 DIAGNOSIS — R55 Syncope and collapse: Secondary | ICD-10-CM | POA: Diagnosis not present

## 2023-09-27 DIAGNOSIS — K7460 Unspecified cirrhosis of liver: Secondary | ICD-10-CM | POA: Diagnosis not present

## 2023-09-27 DIAGNOSIS — Z7901 Long term (current) use of anticoagulants: Secondary | ICD-10-CM | POA: Diagnosis not present

## 2023-09-27 DIAGNOSIS — E1122 Type 2 diabetes mellitus with diabetic chronic kidney disease: Secondary | ICD-10-CM | POA: Diagnosis not present

## 2023-09-27 DIAGNOSIS — R410 Disorientation, unspecified: Secondary | ICD-10-CM | POA: Diagnosis not present

## 2023-09-27 DIAGNOSIS — I480 Paroxysmal atrial fibrillation: Secondary | ICD-10-CM | POA: Diagnosis not present

## 2023-09-27 DIAGNOSIS — E119 Type 2 diabetes mellitus without complications: Secondary | ICD-10-CM | POA: Diagnosis not present

## 2023-09-27 DIAGNOSIS — K922 Gastrointestinal hemorrhage, unspecified: Secondary | ICD-10-CM | POA: Diagnosis not present

## 2023-09-27 DIAGNOSIS — E785 Hyperlipidemia, unspecified: Secondary | ICD-10-CM | POA: Diagnosis not present

## 2023-09-27 DIAGNOSIS — A409 Streptococcal sepsis, unspecified: Secondary | ICD-10-CM | POA: Diagnosis not present

## 2023-09-27 DIAGNOSIS — K2100 Gastro-esophageal reflux disease with esophagitis, without bleeding: Secondary | ICD-10-CM | POA: Diagnosis not present

## 2023-09-27 DIAGNOSIS — B3781 Candidal esophagitis: Secondary | ICD-10-CM | POA: Diagnosis not present

## 2023-09-27 DIAGNOSIS — M1711 Unilateral primary osteoarthritis, right knee: Secondary | ICD-10-CM | POA: Diagnosis not present

## 2023-09-27 DIAGNOSIS — N179 Acute kidney failure, unspecified: Secondary | ICD-10-CM | POA: Diagnosis not present

## 2023-09-27 DIAGNOSIS — R531 Weakness: Secondary | ICD-10-CM | POA: Diagnosis not present

## 2023-09-27 DIAGNOSIS — J449 Chronic obstructive pulmonary disease, unspecified: Secondary | ICD-10-CM | POA: Diagnosis not present

## 2023-09-27 DIAGNOSIS — R9389 Abnormal findings on diagnostic imaging of other specified body structures: Secondary | ICD-10-CM | POA: Diagnosis not present

## 2023-09-27 DIAGNOSIS — R1319 Other dysphagia: Secondary | ICD-10-CM | POA: Diagnosis not present

## 2023-09-27 DIAGNOSIS — K31811 Angiodysplasia of stomach and duodenum with bleeding: Secondary | ICD-10-CM | POA: Diagnosis not present

## 2023-09-27 DIAGNOSIS — R051 Acute cough: Secondary | ICD-10-CM | POA: Diagnosis not present

## 2023-09-27 DIAGNOSIS — Z96611 Presence of right artificial shoulder joint: Secondary | ICD-10-CM | POA: Diagnosis not present

## 2023-09-27 DIAGNOSIS — R278 Other lack of coordination: Secondary | ICD-10-CM | POA: Diagnosis not present

## 2023-09-27 DIAGNOSIS — Z79899 Other long term (current) drug therapy: Secondary | ICD-10-CM | POA: Diagnosis not present

## 2023-09-27 DIAGNOSIS — R509 Fever, unspecified: Secondary | ICD-10-CM | POA: Diagnosis not present

## 2023-09-27 DIAGNOSIS — Z7401 Bed confinement status: Secondary | ICD-10-CM | POA: Diagnosis not present

## 2023-09-27 DIAGNOSIS — G9340 Encephalopathy, unspecified: Secondary | ICD-10-CM | POA: Diagnosis not present

## 2023-09-27 DIAGNOSIS — G8191 Hemiplegia, unspecified affecting right dominant side: Secondary | ICD-10-CM | POA: Diagnosis not present

## 2023-09-27 DIAGNOSIS — R404 Transient alteration of awareness: Secondary | ICD-10-CM | POA: Diagnosis not present

## 2023-09-27 DIAGNOSIS — R6520 Severe sepsis without septic shock: Secondary | ICD-10-CM | POA: Diagnosis not present

## 2023-09-27 DIAGNOSIS — M6281 Muscle weakness (generalized): Secondary | ICD-10-CM | POA: Diagnosis not present

## 2023-09-27 DIAGNOSIS — R262 Difficulty in walking, not elsewhere classified: Secondary | ICD-10-CM | POA: Diagnosis not present

## 2023-09-27 DIAGNOSIS — R001 Bradycardia, unspecified: Secondary | ICD-10-CM | POA: Diagnosis not present

## 2023-09-27 DIAGNOSIS — I76 Septic arterial embolism: Secondary | ICD-10-CM | POA: Diagnosis not present

## 2023-09-27 DIAGNOSIS — K529 Noninfective gastroenteritis and colitis, unspecified: Secondary | ICD-10-CM | POA: Diagnosis not present

## 2023-09-27 DIAGNOSIS — N182 Chronic kidney disease, stage 2 (mild): Secondary | ICD-10-CM | POA: Diagnosis not present

## 2023-09-27 DIAGNOSIS — R5381 Other malaise: Secondary | ICD-10-CM | POA: Diagnosis not present

## 2023-09-27 DIAGNOSIS — R7881 Bacteremia: Secondary | ICD-10-CM | POA: Diagnosis not present

## 2023-09-27 DIAGNOSIS — I4891 Unspecified atrial fibrillation: Secondary | ICD-10-CM | POA: Diagnosis not present

## 2023-09-27 DIAGNOSIS — Z96612 Presence of left artificial shoulder joint: Secondary | ICD-10-CM | POA: Diagnosis not present

## 2023-09-27 DIAGNOSIS — R4182 Altered mental status, unspecified: Secondary | ICD-10-CM | POA: Diagnosis not present

## 2023-09-27 DIAGNOSIS — N289 Disorder of kidney and ureter, unspecified: Secondary | ICD-10-CM | POA: Diagnosis not present

## 2023-09-27 DIAGNOSIS — E1159 Type 2 diabetes mellitus with other circulatory complications: Secondary | ICD-10-CM | POA: Diagnosis not present

## 2023-09-27 DIAGNOSIS — I10 Essential (primary) hypertension: Secondary | ICD-10-CM | POA: Diagnosis not present

## 2023-09-27 DIAGNOSIS — Z1152 Encounter for screening for COVID-19: Secondary | ICD-10-CM | POA: Diagnosis not present

## 2023-09-27 DIAGNOSIS — K7689 Other specified diseases of liver: Secondary | ICD-10-CM | POA: Diagnosis not present

## 2023-09-27 LAB — COMPREHENSIVE METABOLIC PANEL
ALT: 74 U/L — ABNORMAL HIGH (ref 0–44)
AST: 66 U/L — ABNORMAL HIGH (ref 15–41)
Albumin: 2.1 g/dL — ABNORMAL LOW (ref 3.5–5.0)
Alkaline Phosphatase: 87 U/L (ref 38–126)
Anion gap: 11 (ref 5–15)
BUN: 10 mg/dL (ref 8–23)
CO2: 23 mmol/L (ref 22–32)
Calcium: 8.3 mg/dL — ABNORMAL LOW (ref 8.9–10.3)
Chloride: 101 mmol/L (ref 98–111)
Creatinine, Ser: 0.81 mg/dL (ref 0.61–1.24)
GFR, Estimated: >60 mL/min (ref >60)
Glucose, Bld: 352 mg/dL — ABNORMAL HIGH (ref 70–99)
Potassium: 3.8 mmol/L (ref 3.5–5.1)
Sodium: 135 mmol/L (ref 135–145)
Total Bilirubin: 0.9 mg/dL (ref 0.0–1.2)
Total Protein: 5.6 g/dL — ABNORMAL LOW (ref 6.5–8.1)

## 2023-09-27 LAB — RETICULOCYTES
Immature Retic Fract: 29.1 % — ABNORMAL HIGH (ref 2.3–15.9)
RBC.: 2.73 MIL/uL — ABNORMAL LOW (ref 4.22–5.81)
Retic Count, Absolute: 77.5 10*3/uL (ref 19.0–186.0)
Retic Ct Pct: 2.8 % (ref 0.4–3.1)

## 2023-09-27 LAB — CBC WITH DIFFERENTIAL/PLATELET
Abs Immature Granulocytes: 0.22 10*3/uL — ABNORMAL HIGH (ref 0.00–0.07)
Basophils Absolute: 0.1 10*3/uL (ref 0.0–0.1)
Basophils Relative: 1 %
Eosinophils Absolute: 0.2 10*3/uL (ref 0.0–0.5)
Eosinophils Relative: 5 %
HCT: 28.7 % — ABNORMAL LOW (ref 39.0–52.0)
Hemoglobin: 9.7 g/dL — ABNORMAL LOW (ref 13.0–17.0)
Immature Granulocytes: 4 %
Lymphocytes Relative: 22 %
Lymphs Abs: 1.1 10*3/uL (ref 0.7–4.0)
MCH: 36.1 pg — ABNORMAL HIGH (ref 26.0–34.0)
MCHC: 33.8 g/dL (ref 30.0–36.0)
MCV: 106.7 fL — ABNORMAL HIGH (ref 80.0–100.0)
Monocytes Absolute: 0.7 10*3/uL (ref 0.1–1.0)
Monocytes Relative: 13 %
Neutro Abs: 2.8 10*3/uL (ref 1.7–7.7)
Neutrophils Relative %: 55 %
Platelets: 114 10*3/uL — ABNORMAL LOW (ref 150–400)
RBC: 2.69 MIL/uL — ABNORMAL LOW (ref 4.22–5.81)
RDW: 16.6 % — ABNORMAL HIGH (ref 11.5–15.5)
WBC: 5.1 10*3/uL (ref 4.0–10.5)
nRBC: 0 % (ref 0.0–0.2)

## 2023-09-27 LAB — FERRITIN: Ferritin: 278 ng/mL (ref 24–336)

## 2023-09-27 LAB — GLUCOSE, CAPILLARY
Glucose-Capillary: 348 mg/dL — ABNORMAL HIGH (ref 70–99)
Glucose-Capillary: 355 mg/dL — ABNORMAL HIGH (ref 70–99)
Glucose-Capillary: 362 mg/dL — ABNORMAL HIGH (ref 70–99)
Glucose-Capillary: 383 mg/dL — ABNORMAL HIGH (ref 70–99)

## 2023-09-27 LAB — FOLATE: Folate: 17.9 ng/mL (ref >5.9)

## 2023-09-27 LAB — IRON AND TIBC
Iron: 44 ug/dL — ABNORMAL LOW (ref 45–182)
Saturation Ratios: 20 % (ref 17.9–39.5)
TIBC: 220 ug/dL — ABNORMAL LOW (ref 250–450)
UIBC: 176 ug/dL

## 2023-09-27 LAB — MAGNESIUM: Magnesium: 1.9 mg/dL (ref 1.7–2.4)

## 2023-09-27 LAB — VITAMIN B12: Vitamin B-12: 1604 pg/mL — ABNORMAL HIGH (ref 180–914)

## 2023-09-27 LAB — PHOSPHORUS: Phosphorus: 2.8 mg/dL (ref 2.5–4.6)

## 2023-09-27 MED ORDER — CEFTRIAXONE IV (FOR PTA / DISCHARGE USE ONLY): 2.0000 g | INTRAVENOUS | Status: DC

## 2023-09-27 MED ORDER — INSULIN ASPART 100 UNIT/ML IJ SOLN
8.0000 [IU] | Freq: Three times a day (TID) | INTRAMUSCULAR | Status: DC
  Administered 2023-09-27 (×2): 8 [IU] via SUBCUTANEOUS

## 2023-09-27 MED ORDER — PANTOPRAZOLE SODIUM 40 MG PO TBEC: 40.0000 mg | DELAYED_RELEASE_TABLET | Freq: Every day | ORAL | Status: DC

## 2023-09-27 MED ORDER — SODIUM CHLORIDE 0.9% FLUSH
10.0000 mL | INTRAVENOUS | Status: DC | PRN
Start: 2023-09-27 — End: 2023-09-28

## 2023-09-27 MED ORDER — INSULIN ASPART 100 UNIT/ML IJ SOLN: 2.0000 [IU] | Freq: Three times a day (TID) | INTRAMUSCULAR | Status: DC

## 2023-09-27 MED ORDER — INSULIN GLARGINE-YFGN 100 UNIT/ML ~~LOC~~ SOLN: 20.0000 [IU] | Freq: Every day | SUBCUTANEOUS | Status: DC

## 2023-09-27 MED ORDER — TOUJEO MAX SOLOSTAR 300 UNIT/ML ~~LOC~~ SOPN: 20.0000 [IU] | PEN_INJECTOR | Freq: Every day | SUBCUTANEOUS | Status: DC

## 2023-09-27 MED ORDER — FLUCONAZOLE 200 MG PO TABS: 400.0000 mg | ORAL_TABLET | Freq: Every day | ORAL | Status: AC

## 2023-09-27 MED ORDER — SODIUM CHLORIDE 0.9% FLUSH: 10.0000 mL | Freq: Two times a day (BID) | INTRAVENOUS | Status: DC

## 2023-09-27 MED ORDER — INSULIN ASPART 100 UNIT/ML IJ SOLN: 8.0000 [IU] | Freq: Three times a day (TID) | INTRAMUSCULAR | Status: DC

## 2023-09-27 MED ORDER — METOPROLOL SUCCINATE ER 25 MG PO TB24: 25.0000 mg | ORAL_TABLET | Freq: Every day | ORAL | Status: DC

## 2023-09-27 MED ORDER — INSULIN GLARGINE-YFGN 100 UNIT/ML ~~LOC~~ SOLN
5.0000 [IU] | Freq: Once | SUBCUTANEOUS | Status: AC
  Administered 2023-09-27: 5 [IU] via SUBCUTANEOUS
  Filled 2023-09-27: qty 0.05

## 2023-09-27 NOTE — Progress Notes (Signed)
DISCHARGE NOTE SNF Tony Cantu to be discharged Skilled nursing facility per MD order. Patient verbalized understanding.  Skin clean, dry and intact without evidence of skin break down, no evidence of skin tears noted. IV catheter discontinued intact. Site without signs and symptoms of complications. Dressing and pressure applied. Pt denies pain at the site currently. No complaints noted.  Patient free of lines, drains, and wounds.   Discharge packet assembled. An After Visit Summary (AVS) was printed and given to the EMS personnel. Patient escorted via stretcher and discharged to [facility] via ambulance. Report called to accepting facility; all questions and concerns addressed.   Lorine Bears, RN

## 2023-09-27 NOTE — Telephone Encounter (Signed)
Copied from CRM [id number]. Topic: General - Other >> Sep 27, 2023 10:47 AM Ivette P wrote: Reason for CRM: Pt called in to notify he is in the [facility] and wanted to relay the information to Dr. Nadine Counts, Rozell Searing.

## 2023-09-27 NOTE — TOC Transition Note (Signed)
Transition of Care [facility]) - Discharge Note   Patient Details  Name: Tony Cantu MRN: [id number] Date of Birth: 18-May-1951  [facility] CM/SW Contact:  Delilah Shan, LCSWA Phone Number: 09/27/2023, 1:56 PM   Clinical Narrative:     Patient will DC to: [facility]   Anticipated DC date: 09/27/2023  Family notified: [name]   Transport by: [name]  ?  Per MD patient ready for DC to [facility] . RN, patient, patient's family, and facility notified of DC. Patients home cpap will go with him to facility.Discharge Summary sent to facility. RN given number for report [phone] RM#[id number]. DC packet on chart. Ambulance transport requested for patient.  CSW signing off.    Final next level of care: Skilled Nursing Facility Barriers to Discharge: No Barriers Identified   Patient Goals and CMS Choice Patient states their goals for this hospitalization and ongoing recovery are:: SNF CMS Medicare.gov Compare Post Acute Care list provided to:: Patient Choice offered to / list presented to : Patient      Discharge Placement              Patient chooses bed at:  [facility]) Patient to be transferred to facility by: [facility] Name of family member notified: [name] Patient and family notified of of transfer: 09/27/23  Discharge Plan and Services Additional resources added to the After Visit Summary for   In-house Referral: Clinical Social Work                                   Social Drivers of Health (SDOH) Interventions SDOH Screenings   Food Insecurity: Patient Unable To Answer (09/20/2023)  Housing: Low Risk  (09/22/2023)  Transportation Needs: Patient Unable To Answer (09/20/2023)  Utilities: Patient Unable To Answer (09/20/2023)  Depression (PHQ2-9): Medium Risk (09/13/2023)  Financial Resource Strain: Low Risk  (06/09/2022)  Social Connections: Patient Unable To Answer (09/20/2023)  Tobacco Use: Medium Risk (09/25/2023)      Readmission Risk Interventions     No data to display

## 2023-09-27 NOTE — Progress Notes (Signed)
Advance Directives signed and notarized

## 2023-09-27 NOTE — Discharge Summary (Signed)
Physician Discharge Summary   NIC LAMPE VHQ:[id number] DOB: 1951/01/01 DOA: 09/19/2023  PCP: [name], DO  Admit date: 09/19/2023 Discharge date: 09/27/2023  Admitted From: Home Disposition:  SNF Discharging physician: Lewie Chamber, MD Barriers to discharge: none  Recommendations at discharge: Follow up with cardiology to discuss TAVR and/or watchman Follow up with ID   Discharge Condition: stable CODE STATUS: Full Diet recommendation:  Diet Orders (From admission, onward)     Start     Ordered   09/27/23 0000  Diet - low sodium heart healthy        09/27/23 1342   09/25/23 1305  Diet Heart Room service appropriate? Yes; Fluid consistency: Thin  Diet effective now       Question Answer Comment  Room service appropriate? Yes   Fluid consistency: Thin      09/25/23 1304            Hospital Course: Mr. Brar is a 73 year old Caucasian male with a past medical history significant for but not limited to atrial fibrillation on anticoagulation, history of liver cirrhosis, seizure disorder, history of aortic stenosis, hypertension, peptic ulcer disease, diabetes mellitus, osteoarthritis with bilateral knee replacement, polyclonal gammopathy, history of OSA on CPAP as well as other comorbidities who presented to hospital due to generalized weakness, confusion and fever.  He was admitted and found to have sepsis secondary to streptococcal bacteremia and acute metabolic encephalopathy.  Was also found esophageal varices on CT of the abdomen and pelvis.  TEE and EGD were recommended.   Further workup revealed Streptococcus salivarius bacteremia and he was placed on IV cefepime ceftriaxone 2 g.  He was also noted to have esophageal candidiasis and worked up for his anemia and melena.  Assessment and Plan:  Sepsis Secondary Streptococcus Salivarius Bacteremia -Continue IV ceftriaxone.  No vegetations on 2D echo nor on TEE.  -CT scan abdomen pelvis showed mild left greater  than right perinephric stranding with possible areas of cortical hypertension management involving the kidneys felt to be septic infarcts -Final antibiotic plan per ID who is following and they are recommending PICC line placement with 4 weeks of IV ceftriaxone with end of treatment date 10/18/2023 -Patient to follow-up with ID in outpatient setting and has a clinic appointment on 10/10/2023 at 3 PM -ID they are also recommending 3 weeks of p.o. Fluconazole   Acute Metabolic Encephalopathy -Resolved   Right-Sided Weakness -No acute stroke on MRI brain. -PT recommended discharge to SNF, patient agreeable   Melena and Macrocytic Anemia -Hgb has improved spontaneously and is at baseline -EGD showed no signs bleeding -Stable at discharge, 9.7 g/dL   Esophageal Candidiasis -Seen on EGD, will start fluconazole 21 days per GI Recommendations    Type I DM -Continue Semglee and SSI   Paroxysmal Atrial Fibrillation -Resume apixaban given stable hgb and negative EGD -C/w Metoprolol Succinate 25 mg po Daily per cardiology -Cardiology recommending considering a watchman as an outpatient   Hypophosphatemia -repleted   Severe Aortic Stenosis -2D echo on 09/20/2023 showed EF estimated at 55 to 60%, severe aortic stenosis, grade 1 diastolic dysfunction.  -Cardiology following was severe on recent echocardiogram but was moderate on the TEE -Given that the patient is very inactive and denies dyspnea or chest pain or syncope the cardiology team recommends outpatient follow-up for further evaluation and feels that he may need a TAVR in the future   Liver Cirrhosis  Abnormal LFTs -Compensated.  Portal htn on CT abdomen but no active  bleeding seen.  -No varices on EGD  -LFTs are mildly elevated and have been down-trending and stablized    Thrombocytopenia -In the setting of cirrhosis and worsened in the setting of infection   OSA -Has inspire device   Hypoalbuminemia - continue diet  The  patient's acute and chronic medical conditions were treated accordingly. On day of discharge, patient was felt deemed stable for discharge. Patient/family member advised to call PCP or come back to ER if needed.   Principal Diagnosis: Sepsis [facility])  Discharge Diagnoses: Active Hospital Problems   Diagnosis Date Noted   Sepsis (HCC) 09/20/2023   Other cirrhosis of liver (HCC) 09/25/2023   Abnormal finding on GI tract imaging 09/25/2023   Esophageal candidiasis (HCC) 09/25/2023   Bacteremia 09/21/2023   Stroke-like symptom 09/20/2023   Acute encephalopathy 09/20/2023   Transaminitis 09/20/2023   Renal lesion 09/20/2023   Melena 09/20/2023    Resolved Hospital Problems  No resolved problems to display.     Discharge Instructions     [facility] pharmacist to adjust dose for Vancomycin, Aminoglycosides and other anti-infective therapies as requested by physician.   Complete by: As directed    [facility] to provide Cath Flo 2mg    Complete by: As directed    Administer for PICC line occlusion and as ordered by physician for other access device issues.   Anaphylaxis Kit: Provided to treat any anaphylactic reaction to the medication being provided to the patient if First Dose or when requested by physician   Complete by: As directed    Epinephrine 1mg /ml vial / amp: Administer 0.3mg  (0.50ml) subcutaneously once for moderate to severe anaphylaxis, nurse to call physician and pharmacy when reaction occurs and call 911 if needed for immediate care   Diphenhydramine 50mg /ml IV vial: Administer 25-50mg  IV/IM PRN for first dose reaction, rash, itching, mild reaction, nurse to call physician and pharmacy when reaction occurs   Sodium Chloride 0.9% NS IV: Administer if needed for hypovolemic blood pressure drop or as ordered by physician after call to physician with anaphylactic reaction   Change dressing on IV access line weekly and PRN   Complete by: As directed     Diet - low sodium heart healthy   Complete by: As directed    Flush IV access with Sodium Chloride 0.9% and Heparin 10 units/ml or 100 units/ml   Complete by: As directed    Home infusion instructions - [facility]   Complete by: As directed    Instructions: Flush IV access with Sodium Chloride 0.9% and Heparin 10units/ml or 100units/ml   Change dressing on IV access line: Weekly and PRN   Instructions Cath Flo 2mg : Administer for PICC Line occlusion and as ordered by physician for other access device   [facility] pharmacist to adjust dose for: Vancomycin, Aminoglycosides and other anti-infective therapies as requested by physician   Increase activity slowly   Complete by: As directed    Method of administration may be changed at the discretion of home infusion pharmacist based upon assessment of the patient and/or caregiver's ability to self-administer the medication ordered   Complete by: As directed    No wound care   Complete by: As directed       Allergies as of 09/27/2023       Reactions   Dimetapp Children's Cold-cough Other (See Comments)   Chest discomfort    Erythromycin Diarrhea   Sulfonamide Derivatives Diarrhea  Medication List     STOP taking these medications    insulin lispro 100 UNIT/ML injection Commonly known as: HumaLOG       TAKE these medications    acetaminophen 500 MG tablet Commonly known as: TYLENOL Take 1,000 mg by mouth every 6 (six) hours as needed for moderate pain.   apixaban 5 MG Tabs tablet Commonly known as: ELIQUIS Take 1 tablet (5 mg total) by mouth 2 (two) times daily.   atorvastatin 40 MG tablet Commonly known as: LIPITOR Take 1 tablet (40 mg total) by mouth daily.   b complex vitamins tablet Take 1 tablet by mouth daily.   cefTRIAXone IVPB Commonly known as: ROCEPHIN Inject 2 g into the vein daily. Indication:  10/18/23 First Dose: Yes Last Day of Therapy:  10/18/23 Labs - Once weekly:   CBC/D and BMP, Labs - Once weekly: ESR and CRP   CHROMIUM PICOLATE PO Take 1 tablet by mouth daily.   CINNAMON PO Take 1,080 mg by mouth daily.   Contour Next One Kit Test BS QID and as needed Dx E11.9   Contour Next Test test strip Generic drug: glucose blood Test BS QID and as needed Dx E11.9   CoQ10 100 MG Caps Take 100 mg by mouth daily.   Dialyvite Vitamin D 5000 125 MCG (5000 UT) capsule Generic drug: Cholecalciferol Take 5,000 Units by mouth daily.   diltiazem 30 MG tablet Commonly known as: CARDIZEM Take 1 tablet (30 mg total) by mouth 2 (two) times daily. Patient must schedule annual office visit for further refills   Eye Health Caps Take 1 capsule by mouth daily.   ferrous sulfate 325 (65 FE) MG EC tablet Take 1 tablet (325 mg total) by mouth daily with breakfast.   fluconazole 200 MG tablet Commonly known as: DIFLUCAN Take 2 tablets (400 mg total) by mouth daily for 18 days. Start taking on: September 28, 2023   FreeStyle Libre Sensor System Misc Check BS eight (8) times a day. Dx E10.9   furosemide 20 MG tablet Commonly known as: LASIX Take 1.5 tablets (30 mg total) by mouth daily.   Glucagon Emergency 1 MG Kit INJECT AS DIRECTED INTO  UPPER ARM, THIGH OR  BUTTOCKS AS NEEDED FOR  SEVERE HYPOGLYCEMIA. SEEK  MEDICAL ATTENTION AFTER USE What changed: See the new instructions.   Glucosamine 1500 Complex Caps Take 2 capsules by mouth daily.   insulin aspart 100 UNIT/ML injection Commonly known as: novoLOG Inject 2-15 Units into the skin 4 (four) times daily -  before meals and at bedtime. CBG 121 - 150: 2 units, CBG 151 - 200: 3 units, CBG 201 - 250: 5 units, CBG 251 - 300: 8 units, CBG 301 - 350: 11 units, CBG 351 - 400: 15 units, CBG > 400call MD   insulin aspart 100 UNIT/ML injection Commonly known as: novoLOG Inject 8 Units into the skin 3 (three) times daily with meals.   Magnesium 250 MG Tabs Take 250 mg by mouth daily.   metoprolol succinate  25 MG 24 hr tablet Commonly known as: TOPROL-XL Take 1 tablet (25 mg total) by mouth daily. Start taking on: September 28, 2023   Microlet Lancets Misc Test BS QID and as needed Dx E11.9   montelukast 10 MG tablet Commonly known as: SINGULAIR Take 1 tablet (10 mg total) by mouth at bedtime.   pantoprazole 40 MG tablet Commonly known as: PROTONIX TAKE 1 TABLET BY MOUTH TWICE  DAILY BEFORE MEALS  potassium chloride SA 20 MEQ tablet Commonly known as: KLOR-CON M Take 1 tablet (20 mEq total) by mouth daily.   ramelteon 8 MG tablet Commonly known as: Rozerem Take 1 tablet (8 mg total) by mouth at bedtime.   Testosterone 30 MG/ACT Soln Apply 1 pump under each axilla once daily.   Toujeo Max SoloStar 300 UNIT/ML Solostar Pen Generic drug: insulin glargine (2 Unit Dial) Inject 20 Units into the skin daily. What changed: how much to take   Turmeric Curcumin 500 MG Caps Take 500 mg by mouth daily.   valsartan 40 MG tablet Commonly known as: DIOVAN Take 1 tablet (40 mg total) by mouth daily.               Discharge Care Instructions  (From admission, onward)           Start     Ordered   09/27/23 0000  Change dressing on IV access line weekly and PRN  (Home infusion instructions - [facility] )        09/27/23 1336            Contact information for after-discharge care     Destination     [facility] .   Service: Skilled Nursing Contact information: [address] [place] [address] [phone]                    Allergies  Allergen Reactions   Dimetapp Children's Cold-Cough Other (See Comments)    Chest discomfort    Erythromycin Diarrhea   Sulfonamide Derivatives Diarrhea    Consultations: ID  Procedures:   Discharge Exam: BP 109/66 (BP Location: Right Arm)   Pulse 70   Temp 98.2 F (36.8 C) (Oral)   Resp 18   Ht 5\' 8"  (1.727 m)   Wt 92.4 kg   SpO2 96%   BMI 30.97  kg/m  Physical Exam Constitutional:      General: He is not in acute distress.    Appearance: Normal appearance.  HENT:     Head: Normocephalic and atraumatic.     Mouth/Throat:     Mouth: Mucous membranes are moist.  Eyes:     Extraocular Movements: Extraocular movements intact.  Cardiovascular:     Rate and Rhythm: Normal rate and regular rhythm.  Pulmonary:     Effort: Pulmonary effort is normal. No respiratory distress.     Breath sounds: Normal breath sounds. No wheezing.  Abdominal:     General: Bowel sounds are normal. There is no distension.     Palpations: Abdomen is soft.     Tenderness: There is no abdominal tenderness.  Musculoskeletal:        General: Normal range of motion.     Cervical back: Normal range of motion and neck supple.  Skin:    General: Skin is warm and dry.  Neurological:     General: No focal deficit present.     Mental Status: He is alert.  Psychiatric:        Mood and Affect: Mood normal.        Behavior: Behavior normal.      The results of significant diagnostics from this hospitalization (including imaging, microbiology, ancillary and laboratory) are listed below for reference.   Microbiology: Recent Results (from the past 240 hours)  Blood culture (routine x 2)     Status: Abnormal   Collection Time: 09/19/23  6:48 PM   Specimen: BLOOD  LEFT HAND  Result Value Ref Range Status   Specimen Description   Final    BLOOD LEFT HAND Performed at [facility], [address], [place], [place] [address]    Special Requests   Final    BOTTLES DRAWN AEROBIC ONLY Blood Culture results may not be optimal due to an inadequate volume of blood received in culture bottles Performed at [facility], [address] [place], [place] [address]    Culture  Setup Time   Final    GRAM POSITIVE COCCI AEROBIC BOTTLE ONLY Gram Stain Report Called to,Read Back By and Verified With: KRISTY EDWARDS AT 0859 09/20/2023 BY A. SNYDER GRAM STAIN  REVIEWED-AGREE WITH RESULT DRT    Culture (A)  Final    STREPTOCOCCUS SALIVARIUS SUSCEPTIBILITIES PERFORMED ON PREVIOUS CULTURE WITHIN THE LAST 5 DAYS. Performed at [facility], [address], [place], [place] [address]    Report Status 09/22/2023 FINAL  Final  Blood culture (routine x 2)     Status: Abnormal   Collection Time: 09/19/23  6:48 PM   Specimen: BLOOD LEFT HAND  Result Value Ref Range Status   Specimen Description   Final    BLOOD LEFT HAND Performed at [facility], [address] [place], [place] [address]    Special Requests   Final    BOTTLES DRAWN AEROBIC ONLY Blood Culture results may not be optimal due to an inadequate volume of blood received in culture bottles Performed at [facility], [address] [place], [place] [address]    Culture  Setup Time   Final    GRAM POSITIVE COCCI AEROBIC BOTTLE ONLY Gram Stain Report Called to,Read Back By and Verified With: KRISTY EDWARDS AT 0859 09/20/2023 BY A. SNYDER GRAM STAIN REVIEWED-AGREE WITH RESULT DRT CRITICAL RESULT CALLED TO, READ BACK BY AND VERIFIED WITH: PHARMD F.WILSON [id number] @ 1518 FH Performed at [facility], [address] [place], [place] [address]    Culture STREPTOCOCCUS SALIVARIUS (A)  Final   Report Status 09/22/2023 FINAL  Final   Organism ID, Bacteria STREPTOCOCCUS SALIVARIUS  Final      Susceptibility   Streptococcus salivarius - MIC*    PENICILLIN 0.25 INTERMEDIATE Intermediate     CEFTRIAXONE <=0.12 SENSITIVE Sensitive     ERYTHROMYCIN 2 RESISTANT Resistant     LEVOFLOXACIN 2 SENSITIVE Sensitive     VANCOMYCIN 1 SENSITIVE Sensitive     * STREPTOCOCCUS SALIVARIUS  Blood Culture ID Panel (Reflexed)     Status: Abnormal   Collection Time: 09/19/23  6:48 PM  Result Value Ref Range Status   Enterococcus faecalis NOT DETECTED NOT DETECTED Final   Enterococcus Faecium NOT DETECTED NOT DETECTED Final   Listeria monocytogenes NOT DETECTED NOT DETECTED Final   Staphylococcus  species NOT DETECTED NOT DETECTED Final   Staphylococcus aureus (BCID) NOT DETECTED NOT DETECTED Final   Staphylococcus epidermidis NOT DETECTED NOT DETECTED Final   Staphylococcus lugdunensis NOT DETECTED NOT DETECTED Final   Streptococcus species DETECTED (A) NOT DETECTED Final    Comment: Not Enterococcus species, Streptococcus agalactiae, Streptococcus pyogenes, or Streptococcus pneumoniae. CRITICAL RESULT CALLED TO, READ BACK BY AND VERIFIED WITH: PHARMD F.WILSON [id number] @ 1518 FH    Streptococcus agalactiae NOT DETECTED NOT DETECTED Final   Streptococcus pneumoniae NOT DETECTED NOT DETECTED Final   Streptococcus pyogenes NOT DETECTED NOT DETECTED Final   A.calcoaceticus-baumannii NOT DETECTED NOT DETECTED Final   Bacteroides fragilis NOT DETECTED NOT DETECTED Final  Enterobacterales NOT DETECTED NOT DETECTED Final   Enterobacter cloacae complex NOT DETECTED NOT DETECTED Final   Escherichia coli NOT DETECTED NOT DETECTED Final   Klebsiella aerogenes NOT DETECTED NOT DETECTED Final   Klebsiella oxytoca NOT DETECTED NOT DETECTED Final   Klebsiella pneumoniae NOT DETECTED NOT DETECTED Final   Proteus species NOT DETECTED NOT DETECTED Final   Salmonella species NOT DETECTED NOT DETECTED Final   Serratia marcescens NOT DETECTED NOT DETECTED Final   Haemophilus influenzae NOT DETECTED NOT DETECTED Final   Neisseria meningitidis NOT DETECTED NOT DETECTED Final   Pseudomonas aeruginosa NOT DETECTED NOT DETECTED Final   Stenotrophomonas maltophilia NOT DETECTED NOT DETECTED Final   Candida albicans NOT DETECTED NOT DETECTED Final   Candida auris NOT DETECTED NOT DETECTED Final   Candida glabrata NOT DETECTED NOT DETECTED Final   Candida krusei NOT DETECTED NOT DETECTED Final   Candida parapsilosis NOT DETECTED NOT DETECTED Final   Candida tropicalis NOT DETECTED NOT DETECTED Final   Cryptococcus neoformans/gattii NOT DETECTED NOT DETECTED Final    Comment: Performed at [facility], [address], [place], [place] [address]  Resp panel by RT-PCR (RSV, Flu A&B, Covid) Anterior Nasal Swab     Status: None   Collection Time: 09/19/23  7:30 PM   Specimen: Anterior Nasal Swab  Result Value Ref Range Status   SARS Coronavirus 2 by RT PCR NEGATIVE NEGATIVE Final    Comment: (NOTE) SARS-CoV-2 target nucleic acids are NOT DETECTED.  The SARS-CoV-2 RNA is generally detectable in upper respiratory specimens during the acute phase of infection. The lowest concentration of SARS-CoV-2 viral copies this assay can detect is 138 copies/mL. A negative result does not preclude SARS-Cov-2 infection and should not be used as the sole basis for treatment or other patient management decisions. A negative result may occur with  improper specimen collection/handling, submission of specimen other than nasopharyngeal swab, presence of viral mutation(s) within the areas targeted by this assay, and inadequate number of viral copies(<138 copies/mL). A negative result must be combined with clinical observations, patient history, and epidemiological information. The expected result is Negative.  Fact Sheet for Patients:  BloggerCourse.com  Fact Sheet for Healthcare Providers:  SeriousBroker.it  This test is no t yet approved or cleared by the [place] FDA and  has been authorized for detection and/or diagnosis of SARS-CoV-2 by FDA under an Emergency Use Authorization (EUA). This EUA will remain  in effect (meaning this test can be used) for the duration of the COVID-19 declaration under Section 564(b)(1) of the Act, 21 U.S.C.section 360bbb-3(b)(1), unless the authorization is terminated  or revoked sooner.       Influenza A by PCR NEGATIVE NEGATIVE Final   Influenza B by PCR NEGATIVE NEGATIVE Final    Comment: (NOTE) The Xpert Xpress SARS-CoV-2/FLU/RSV plus assay is intended as an aid in the diagnosis of influenza from  Nasopharyngeal swab specimens and should not be used as a sole basis for treatment. Nasal washings and aspirates are unacceptable for Xpert Xpress SARS-CoV-2/FLU/RSV testing.  Fact Sheet for Patients: BloggerCourse.com  Fact Sheet for Healthcare Providers: SeriousBroker.it  This test is not yet approved or cleared by the [place] FDA and has been authorized for detection and/or diagnosis of SARS-CoV-2 by FDA under an Emergency Use Authorization (EUA). This EUA will remain in effect (meaning this test can be used) for the duration of the COVID-19 declaration under Section 564(b)(1) of the Act, 21 U.S.C. section 360bbb-3(b)(1), unless the authorization  is terminated or revoked.     Resp Syncytial Virus by PCR NEGATIVE NEGATIVE Final    Comment: (NOTE) Fact Sheet for Patients: BloggerCourse.com  Fact Sheet for Healthcare Providers: SeriousBroker.it  This test is not yet approved or cleared by the [place] FDA and has been authorized for detection and/or diagnosis of SARS-CoV-2 by FDA under an Emergency Use Authorization (EUA). This EUA will remain in effect (meaning this test can be used) for the duration of the COVID-19 declaration under Section 564(b)(1) of the Act, 21 U.S.C. section 360bbb-3(b)(1), unless the authorization is terminated or revoked.  Performed at [facility], [address], [place], [place] [address]   Respiratory (~20 pathogens) panel by PCR     Status: None   Collection Time: 09/20/23  7:20 AM   Specimen: Nasopharyngeal Swab; Respiratory  Result Value Ref Range Status   Adenovirus NOT DETECTED NOT DETECTED Final   Coronavirus 229E NOT DETECTED NOT DETECTED Final    Comment: (NOTE) The Coronavirus on the Respiratory Panel, DOES NOT test for the novel  Coronavirus (2019 nCoV)    Coronavirus HKU1 NOT DETECTED NOT DETECTED Final   Coronavirus NL63  NOT DETECTED NOT DETECTED Final   Coronavirus OC43 NOT DETECTED NOT DETECTED Final   Metapneumovirus NOT DETECTED NOT DETECTED Final   Rhinovirus / Enterovirus NOT DETECTED NOT DETECTED Final   Influenza A NOT DETECTED NOT DETECTED Final   Influenza B NOT DETECTED NOT DETECTED Final   Parainfluenza Virus 1 NOT DETECTED NOT DETECTED Final   Parainfluenza Virus 2 NOT DETECTED NOT DETECTED Final   Parainfluenza Virus 3 NOT DETECTED NOT DETECTED Final   Parainfluenza Virus 4 NOT DETECTED NOT DETECTED Final   Respiratory Syncytial Virus NOT DETECTED NOT DETECTED Final   Bordetella pertussis NOT DETECTED NOT DETECTED Final   Bordetella Parapertussis NOT DETECTED NOT DETECTED Final   Chlamydophila pneumoniae NOT DETECTED NOT DETECTED Final   Mycoplasma pneumoniae NOT DETECTED NOT DETECTED Final    Comment: Performed at [facility], [address] [place], [place] [address]  MRSA Next Gen by PCR, Nasal     Status: None   Collection Time: 09/20/23 12:35 PM   Specimen: Nasal Mucosa; Nasal Swab  Result Value Ref Range Status   MRSA by PCR Next Gen NOT DETECTED NOT DETECTED Final    Comment: (NOTE) The GeneXpert MRSA Assay (FDA approved for NASAL specimens only), is one component of a comprehensive MRSA colonization surveillance program. It is not intended to diagnose MRSA infection nor to guide or monitor treatment for MRSA infections. Test performance is not FDA approved in patients less than 13 years old. Performed at [facility], [address] [place], [place] [address]   Culture, blood (Routine X 2) w Reflex to ID Panel     Status: None   Collection Time: 09/20/23  4:55 PM   Specimen: BLOOD  Result Value Ref Range Status   Specimen Description BLOOD BLOOD LEFT ARM  Final   Special Requests   Final    BOTTLES DRAWN AEROBIC AND ANAEROBIC Blood Culture adequate volume   Culture   Final    NO GROWTH 5 DAYS Performed at [facility], [address] [place], [place] [address]     Report Status 09/25/2023 FINAL  Final  Culture, blood (Routine X 2) w Reflex to ID Panel     Status: None   Collection Time: 09/20/23  4:55 PM   Specimen: BLOOD  Result Value Ref Range Status  Specimen Description BLOOD BLOOD RIGHT HAND  Final   Special Requests   Final    BOTTLES DRAWN AEROBIC AND ANAEROBIC Blood Culture adequate volume   Culture   Final    NO GROWTH 5 DAYS Performed at [facility], [address] [place], [place] [address]    Report Status 09/25/2023 FINAL  Final     Labs: BNP (last 3 results) No results for input(s): "BNP" in the last 8760 hours. Basic Metabolic Panel: Recent Labs  Lab 09/22/23 0512 09/23/23 0427 09/24/23 0356 09/25/23 1217 09/26/23 0936 09/27/23 0536  NA 138 136 136 138 135 135  K 3.7 3.6 3.3* 4.3 4.3 3.8  CL 108 108 106 107 105 101  CO2 23 22 22  17* 23 23  GLUCOSE 262* 235* 170* 237* 385* 352*  BUN 17 17 13 10 12 10   CREATININE 0.88 0.77 0.81 0.97 0.93 0.81  CALCIUM 8.5* 8.3* 8.4* 8.9 8.6* 8.3*  MG 1.8 1.9 1.7  --  1.7 1.9  PHOS  --   --   --   --  1.9* 2.8   Liver Function Tests: Recent Labs  Lab 09/23/23 0427 09/24/23 0356 09/25/23 1217 09/26/23 0936 09/27/23 0536  AST 128* 92* 77* 86* 66*  ALT 82* 75* 71* 76* 74*  ALKPHOS 73 77 85 88 87  BILITOT 0.5 0.6 1.0 1.1 0.9  PROT 5.2* 5.5* 6.1* 5.6* 5.6*  ALBUMIN 2.3* 2.1* 2.3* 2.2* 2.1*   No results for input(s): "LIPASE", "AMYLASE" in the last 168 hours. No results for input(s): "AMMONIA" in the last 168 hours. CBC: Recent Labs  Lab 09/23/23 0427 09/24/23 0356 09/25/23 1318 09/26/23 0936 09/27/23 0536  WBC 4.1 4.8 7.6 5.8 5.1  NEUTROABS  --   --   --  3.9 2.8  HGB 9.5* 9.6* 11.0* 10.6* 9.7*  HCT 29.2* 28.0* 32.9* 31.6* 28.7*  MCV 109.0* 104.1* 107.9* 107.8* 106.7*  PLT 87* 93* 104* 120* 114*   Cardiac Enzymes: No results for input(s): "CKTOTAL", "CKMB", "CKMBINDEX", "TROPONINI" in the last 168 hours. BNP: Invalid input(s): "POCBNP" CBG: Recent Labs  Lab  09/26/23 1627 09/26/23 2108 09/27/23 0727 09/27/23 1159 09/27/23 1224  GLUCAP 283* 276* 348* 355* 362*   D-Dimer No results for input(s): "DDIMER" in the last 72 hours. Hgb A1c No results for input(s): "HGBA1C" in the last 72 hours. Lipid Profile No results for input(s): "CHOL", "HDL", "LDLCALC", "TRIG", "CHOLHDL", "LDLDIRECT" in the last 72 hours. Thyroid function studies No results for input(s): "TSH", "T4TOTAL", "T3FREE", "THYROIDAB" in the last 72 hours.  Invalid input(s): "FREET3" Anemia work up Recent Labs    09/27/23 0536  VITAMINB12 1,604*  FOLATE 17.9  FERRITIN 278  TIBC 220*  IRON 44*  RETICCTPCT 2.8   Urinalysis    Component Value Date/Time   COLORURINE YELLOW 09/19/2023 2040   APPEARANCEUR CLEAR 09/19/2023 2040   APPEARANCEUR Clear 07/05/2021 1129   LABSPEC 1.015 09/19/2023 2040   PHURINE 6.0 09/19/2023 2040   GLUCOSEU NEGATIVE 09/19/2023 2040   GLUCOSEU NEGATIVE 04/22/2021 1048   HGBUR NEGATIVE 09/19/2023 2040   BILIRUBINUR NEGATIVE 09/19/2023 2040   BILIRUBINUR Negative 07/05/2021 1129   KETONESUR 20 (A) 09/19/2023 2040   PROTEINUR NEGATIVE 09/19/2023 2040   UROBILINOGEN 0.2 04/22/2021 1048   NITRITE NEGATIVE 09/19/2023 2040   LEUKOCYTESUR NEGATIVE 09/19/2023 2040   Sepsis Labs Recent Labs  Lab 09/24/23 0356 09/25/23 1318 09/26/23 0936 09/27/23 0536  WBC 4.8 7.6 5.8 5.1   Microbiology Recent Results (from the past 240  hours)  Blood culture (routine x 2)     Status: Abnormal   Collection Time: 09/19/23  6:48 PM   Specimen: BLOOD LEFT HAND  Result Value Ref Range Status   Specimen Description   Final    BLOOD LEFT HAND Performed at [facility], [address] [place], [place] [address]    Special Requests   Final    BOTTLES DRAWN AEROBIC ONLY Blood Culture results may not be optimal due to an inadequate volume of blood received in culture bottles Performed at [facility], [address], [place], [place] [address]    Culture   Setup Time   Final    GRAM POSITIVE COCCI AEROBIC BOTTLE ONLY Gram Stain Report Called to,Read Back By and Verified With: KRISTY EDWARDS AT 0859 09/20/2023 BY A. SNYDER GRAM STAIN REVIEWED-AGREE WITH RESULT DRT    Culture (A)  Final    STREPTOCOCCUS SALIVARIUS SUSCEPTIBILITIES PERFORMED ON PREVIOUS CULTURE WITHIN THE LAST 5 DAYS. Performed at [facility], [address], [place], [place] [address]    Report Status 09/22/2023 FINAL  Final  Blood culture (routine x 2)     Status: Abnormal   Collection Time: 09/19/23  6:48 PM   Specimen: BLOOD LEFT HAND  Result Value Ref Range Status   Specimen Description   Final    BLOOD LEFT HAND Performed at [facility], [address], [place], [place] [address]    Special Requests   Final    BOTTLES DRAWN AEROBIC ONLY Blood Culture results may not be optimal due to an inadequate volume of blood received in culture bottles Performed at [facility], [address], [place], [place] [address]    Culture  Setup Time   Final    GRAM POSITIVE COCCI AEROBIC BOTTLE ONLY Gram Stain Report Called to,Read Back By and Verified With: KRISTY EDWARDS AT 0859 09/20/2023 BY A. SNYDER GRAM STAIN REVIEWED-AGREE WITH RESULT DRT CRITICAL RESULT CALLED TO, READ BACK BY AND VERIFIED WITH: PHARMD F.WILSON [id number] @ 1518 FH Performed at [facility], [address], [place], [place] [address]    Culture STREPTOCOCCUS SALIVARIUS (A)  Final   Report Status 09/22/2023 FINAL  Final   Organism ID, Bacteria STREPTOCOCCUS SALIVARIUS  Final      Susceptibility   Streptococcus salivarius - MIC*    PENICILLIN 0.25 INTERMEDIATE Intermediate     CEFTRIAXONE <=0.12 SENSITIVE Sensitive     ERYTHROMYCIN 2 RESISTANT Resistant     LEVOFLOXACIN 2 SENSITIVE Sensitive     VANCOMYCIN 1 SENSITIVE Sensitive     * STREPTOCOCCUS SALIVARIUS  Blood Culture ID Panel (Reflexed)     Status: Abnormal   Collection Time: 09/19/23  6:48 PM  Result Value Ref Range Status    Enterococcus faecalis NOT DETECTED NOT DETECTED Final   Enterococcus Faecium NOT DETECTED NOT DETECTED Final   Listeria monocytogenes NOT DETECTED NOT DETECTED Final   Staphylococcus species NOT DETECTED NOT DETECTED Final   Staphylococcus aureus (BCID) NOT DETECTED NOT DETECTED Final   Staphylococcus epidermidis NOT DETECTED NOT DETECTED Final   Staphylococcus lugdunensis NOT DETECTED NOT DETECTED Final   Streptococcus species DETECTED (A) NOT DETECTED Final    Comment: Not Enterococcus species, Streptococcus agalactiae, Streptococcus pyogenes, or Streptococcus pneumoniae. CRITICAL RESULT CALLED TO, READ BACK BY AND VERIFIED WITH: PHARMD F.WILSON [id number] @ 1518 FH    Streptococcus agalactiae NOT DETECTED NOT DETECTED Final   Streptococcus pneumoniae NOT DETECTED NOT DETECTED Final  Streptococcus pyogenes NOT DETECTED NOT DETECTED Final   A.calcoaceticus-baumannii NOT DETECTED NOT DETECTED Final   Bacteroides fragilis NOT DETECTED NOT DETECTED Final   Enterobacterales NOT DETECTED NOT DETECTED Final   Enterobacter cloacae complex NOT DETECTED NOT DETECTED Final   Escherichia coli NOT DETECTED NOT DETECTED Final   Klebsiella aerogenes NOT DETECTED NOT DETECTED Final   Klebsiella oxytoca NOT DETECTED NOT DETECTED Final   Klebsiella pneumoniae NOT DETECTED NOT DETECTED Final   Proteus species NOT DETECTED NOT DETECTED Final   Salmonella species NOT DETECTED NOT DETECTED Final   Serratia marcescens NOT DETECTED NOT DETECTED Final   Haemophilus influenzae NOT DETECTED NOT DETECTED Final   Neisseria meningitidis NOT DETECTED NOT DETECTED Final   Pseudomonas aeruginosa NOT DETECTED NOT DETECTED Final   Stenotrophomonas maltophilia NOT DETECTED NOT DETECTED Final   Candida albicans NOT DETECTED NOT DETECTED Final   Candida auris NOT DETECTED NOT DETECTED Final   Candida glabrata NOT DETECTED NOT DETECTED Final   Candida krusei NOT DETECTED NOT DETECTED Final   Candida parapsilosis NOT  DETECTED NOT DETECTED Final   Candida tropicalis NOT DETECTED NOT DETECTED Final   Cryptococcus neoformans/gattii NOT DETECTED NOT DETECTED Final    Comment: Performed at [facility], [address], [place], [place] [address]  Resp panel by RT-PCR (RSV, Flu A&B, Covid) Anterior Nasal Swab     Status: None   Collection Time: 09/19/23  7:30 PM   Specimen: Anterior Nasal Swab  Result Value Ref Range Status   SARS Coronavirus 2 by RT PCR NEGATIVE NEGATIVE Final    Comment: (NOTE) SARS-CoV-2 target nucleic acids are NOT DETECTED.  The SARS-CoV-2 RNA is generally detectable in upper respiratory specimens during the acute phase of infection. The lowest concentration of SARS-CoV-2 viral copies this assay can detect is 138 copies/mL. A negative result does not preclude SARS-Cov-2 infection and should not be used as the sole basis for treatment or other patient management decisions. A negative result may occur with  improper specimen collection/handling, submission of specimen other than nasopharyngeal swab, presence of viral mutation(s) within the areas targeted by this assay, and inadequate number of viral copies(<138 copies/mL). A negative result must be combined with clinical observations, patient history, and epidemiological information. The expected result is Negative.  Fact Sheet for Patients:  BloggerCourse.com  Fact Sheet for Healthcare Providers:  SeriousBroker.it  This test is no t yet approved or cleared by the [place] FDA and  has been authorized for detection and/or diagnosis of SARS-CoV-2 by FDA under an Emergency Use Authorization (EUA). This EUA will remain  in effect (meaning this test can be used) for the duration of the COVID-19 declaration under Section 564(b)(1) of the Act, 21 U.S.C.section 360bbb-3(b)(1), unless the authorization is terminated  or revoked sooner.       Influenza A by PCR NEGATIVE  NEGATIVE Final   Influenza B by PCR NEGATIVE NEGATIVE Final    Comment: (NOTE) The Xpert Xpress SARS-CoV-2/FLU/RSV plus assay is intended as an aid in the diagnosis of influenza from Nasopharyngeal swab specimens and should not be used as a sole basis for treatment. Nasal washings and aspirates are unacceptable for Xpert Xpress SARS-CoV-2/FLU/RSV testing.  Fact Sheet for Patients: BloggerCourse.com  Fact Sheet for Healthcare Providers: SeriousBroker.it  This test is not yet approved or cleared by the [place] FDA and has been authorized for detection and/or diagnosis of SARS-CoV-2 by FDA under an Emergency Use Authorization (EUA). This EUA will remain in effect (  meaning this test can be used) for the duration of the COVID-19 declaration under Section 564(b)(1) of the Act, 21 U.S.C. section 360bbb-3(b)(1), unless the authorization is terminated or revoked.     Resp Syncytial Virus by PCR NEGATIVE NEGATIVE Final    Comment: (NOTE) Fact Sheet for Patients: BloggerCourse.com  Fact Sheet for Healthcare Providers: SeriousBroker.it  This test is not yet approved or cleared by the [place] FDA and has been authorized for detection and/or diagnosis of SARS-CoV-2 by FDA under an Emergency Use Authorization (EUA). This EUA will remain in effect (meaning this test can be used) for the duration of the COVID-19 declaration under Section 564(b)(1) of the Act, 21 U.S.C. section 360bbb-3(b)(1), unless the authorization is terminated or revoked.  Performed at [facility], [name], [place], [place] [address]   Respiratory (~20 pathogens) panel by PCR     Status: None   Collection Time: 09/20/23  7:20 AM   Specimen: Nasopharyngeal Swab; Respiratory  Result Value Ref Range Status   Adenovirus NOT DETECTED NOT DETECTED Final   Coronavirus 229E NOT DETECTED NOT DETECTED Final     Comment: (NOTE) The Coronavirus on the Respiratory Panel, DOES NOT test for the novel  Coronavirus (2019 nCoV)    Coronavirus HKU1 NOT DETECTED NOT DETECTED Final   Coronavirus NL63 NOT DETECTED NOT DETECTED Final   Coronavirus OC43 NOT DETECTED NOT DETECTED Final   Metapneumovirus NOT DETECTED NOT DETECTED Final   Rhinovirus / Enterovirus NOT DETECTED NOT DETECTED Final   Influenza A NOT DETECTED NOT DETECTED Final   Influenza B NOT DETECTED NOT DETECTED Final   Parainfluenza Virus 1 NOT DETECTED NOT DETECTED Final   Parainfluenza Virus 2 NOT DETECTED NOT DETECTED Final   Parainfluenza Virus 3 NOT DETECTED NOT DETECTED Final   Parainfluenza Virus 4 NOT DETECTED NOT DETECTED Final   Respiratory Syncytial Virus NOT DETECTED NOT DETECTED Final   Bordetella pertussis NOT DETECTED NOT DETECTED Final   Bordetella Parapertussis NOT DETECTED NOT DETECTED Final   Chlamydophila pneumoniae NOT DETECTED NOT DETECTED Final   Mycoplasma pneumoniae NOT DETECTED NOT DETECTED Final    Comment: Performed at [facility], [address], [place], [place] [address]  MRSA Next Gen by PCR, Nasal     Status: None   Collection Time: 09/20/23 12:35 PM   Specimen: Nasal Mucosa; Nasal Swab  Result Value Ref Range Status   MRSA by PCR Next Gen NOT DETECTED NOT DETECTED Final    Comment: (NOTE) The GeneXpert MRSA Assay (FDA approved for NASAL specimens only), is one component of a comprehensive MRSA colonization surveillance program. It is not intended to diagnose MRSA infection nor to guide or monitor treatment for MRSA infections. Test performance is not FDA approved in patients less than 68 years old. Performed at [facility], [address] [place], [place] [address]   Culture, blood (Routine X 2) w Reflex to ID Panel     Status: None   Collection Time: 09/20/23  4:55 PM   Specimen: BLOOD  Result Value Ref Range Status   Specimen Description BLOOD BLOOD LEFT ARM  Final   Special Requests    Final    BOTTLES DRAWN AEROBIC AND ANAEROBIC Blood Culture adequate volume   Culture   Final    NO GROWTH 5 DAYS Performed at [facility], [address] [place], [place] [address]    Report Status 09/25/2023 FINAL  Final  Culture, blood (Routine X 2) w Reflex to ID Panel  Status: None   Collection Time: 09/20/23  4:55 PM   Specimen: BLOOD  Result Value Ref Range Status   Specimen Description BLOOD BLOOD RIGHT HAND  Final   Special Requests   Final    BOTTLES DRAWN AEROBIC AND ANAEROBIC Blood Culture adequate volume   Culture   Final    NO GROWTH 5 DAYS Performed at [facility], [address] [place], [place] [address]    Report Status 09/25/2023 FINAL  Final    Procedures/Studies: [place] EKG SITE RITE Result Date: 09/26/2023 If Site Rite image not attached, placement could not be confirmed due to current cardiac rhythm.  ECHO TEE Result Date: 09/25/2023    TRANSESOPHOGEAL ECHO REPORT   Patient Name:   EURAL HOLZSCHUH Rhinesmith Date of Exam: 09/25/2023 Medical Rec #:  [id number]      Height:       68.0 in Accession #:    [id number]     Weight:       203.7 lb Date of Birth:  03-23-1951       BSA:          2.060 m Patient Age:    72 years       BP:           108/63 mmHg Patient Gender: M              HR:           84 bpm. Exam Location:  Inpatient Procedure: Transesophageal Echo, Cardiac Doppler and Color Doppler Indications:     Bacteremia  History:         Patient has prior history of Echocardiogram examinations, most                  recent 09/20/2023. Aortic Valve Disease, Arrythmias:Atrial                  Fibrillation and Tachycardia; Risk Factors:Hypertension,                  Diabetes, Dyslipidemia, Sleep Apnea and Former Smoker.  Sonographer:     Lucendia Herrlich RCS Referring Phys:  [id number] [facility] Diagnosing Phys: Arvilla Meres MD PROCEDURE: After discussion of the risks and benefits of a TEE, an informed consent was obtained from the patient. The transesophogeal probe was passed  without difficulty through the esophogus of the patient. Imaged were obtained with the patient in a left lateral decubitus position. Sedation performed by different physician. The patient was monitored while under deep sedation. Anesthestetic sedation was provided intravenously by Anesthesiology: 207.38mg  of Propofol, 20mg  of Lidocaine. The patient developed no complications during the procedure.  IMPRESSIONS  1. Left ventricular ejection fraction, by estimation, is 60 to 65%. The left ventricle has normal function.  2. Right ventricular systolic function is normal. The right ventricular size is normal.  3. Left atrial size was moderately dilated. No left atrial/left atrial appendage thrombus was detected.  4. The mitral valve is normal in structure. Mild mitral valve regurgitation.  5. The aortic valve is tricuspid. There is moderate calcification of the aortic valve. Aortic valve regurgitation is trivial. Moderate aortic valve stenosis. Aortic valve mean gradient measures 28.0 mmHg. Aortic valve Vmax measures 3.34 m/s.  6. There is mild (Grade II) layered plaque involving the descending aorta. Conclusion(s)/Recommendation(s): No evidence of vegetation/infective endocarditis on this transesophageael echocardiogram. FINDINGS  Left Ventricle: Left ventricular ejection fraction, by estimation, is 60 to 65%. The left ventricle has normal function. The left ventricular  internal cavity size was normal in size. Right Ventricle: The right ventricular size is normal. No increase in right ventricular Freeberg thickness. Right ventricular systolic function is normal. Left Atrium: Left atrial size was moderately dilated. No left atrial/left atrial appendage thrombus was detected. Right Atrium: Right atrial size was normal in size. Pericardium: There is no evidence of pericardial effusion. Mitral Valve: The mitral valve is normal in structure. Mild mitral valve regurgitation. Tricuspid Valve: The tricuspid valve is normal in  structure. Tricuspid valve regurgitation is mild. Aortic Valve: The aortic valve is tricuspid. There is moderate calcification of the aortic valve. Aortic valve regurgitation is trivial. Moderate aortic stenosis is present. Aortic valve mean gradient measures 28.0 mmHg. Aortic valve peak gradient measures 44.6 mmHg. Pulmonic Valve: The pulmonic valve was normal in structure. Pulmonic valve regurgitation is trivial. Aorta: The aortic root and ascending aorta are structurally normal, with no evidence of dilitation. There is mild (Grade II) layered plaque involving the descending aorta. IAS/Shunts: The interatrial septum appears to be lipomatous. No atrial level shunt detected by color flow Doppler. Additional Comments: Spectral Doppler performed. AORTIC VALVE AV Vmax:      334.00 cm/s AV Vmean:     252.000 cm/s AV VTI:       0.922 m AV Peak Grad: 44.6 mmHg AV Mean Grad: 28.0 mmHg Arvilla Meres MD Electronically signed by Arvilla Meres MD Signature Date/Time: 09/25/2023/5:27:34 PM    Final    MR BRAIN WO CONTRAST Result Date: 09/20/2023 CLINICAL DATA:  Encephalopathy. Right-sided facial, arm, and leg weakness. Possible stroke. EXAM: MRI HEAD WITHOUT CONTRAST TECHNIQUE: Multiplanar, multiecho pulse sequences of the brain and surrounding structures were obtained without intravenous contrast. COMPARISON:  Head CT 09/20/2023 FINDINGS: Despite multiple attempts, the examination had to be discontinued prior to completion as the patient attempted to remove the head coil. Axial and coronal diffusion weighted imaging and a sagittal T1 sequence were obtained and suffer from variable, up to severe motion artifact. Brain: A repeated axial DWI sequence is of reasonable quality with only mild motion artifact, and there is no evidence of an acute infarct. No significant intracranial mass effect or sizable extra-axial collection is evident on this limited study. Patchy T2 hyperintensities in the cerebral white matter are  incompletely evaluated and nonspecific but most commonly seen with chronic small vessel ischemia. A chronic lacunar infarct or dilated perivascular space is noted in the posterior right frontal white matter. Vascular: Not assessed on this limited study. Skull and upper cervical spine: No destructive skull lesion. Sinuses/Orbits: Grossly clear paranasal sinuses. Limited assessment of the orbits. Other: None. IMPRESSION: 1. Incomplete, motion degraded examination. 2. No acute infarct. Electronically Signed   By: Sebastian Ache M.D.   On: 09/20/2023 15:19   ECHOCARDIOGRAM COMPLETE BUBBLE STUDY Result Date: 09/20/2023    ECHOCARDIOGRAM REPORT   Patient Name:   BEULAH MATUSEK Devino Date of Exam: 09/20/2023 Medical Rec #:  [id number]      Height:       68.0 in Accession #:    [id number]     Weight:       209.4 lb Date of Birth:  1951/07/24       BSA:          2.084 m Patient Age:    72 years       BP:           99/57 mmHg Patient Gender: M              HR:  66 bpm. [facility] Procedure: 2D Echo, Cardiac Doppler and Color Doppler STAT ECHO Indications:    Sepsis  History:        Patient has prior history of Echocardiogram examinations, most                 recent 09/22/2021. COPD, Arrythmias:Atrial Fibrillation and                 Tachycardia; Risk Factors:Hypertension, Diabetes, Dyslipidemia                 and Former Smoker.  Sonographer:    Vern Claude Referring Phys: [id number] JONATHAN SEGARS IMPRESSIONS  1. The aortic valve is calcified. Possibly tricuspid. There is severe calcifcation of the aortic valve. There is severe thickening of the aortic valve. Aortic valve regurgitation is trivial. Severe aortic valve stenosis. Aortic valve area, by VTI measures 0.89 cm. Aortic valve mean gradient measures 46.3 mmHg. Aortic valve Vmax measures 4.55 m/s. DVI is 0.27.  2. Left ventricular ejection fraction, by estimation, is 55 to 60%. The left ventricle has normal function. Left ventricular endocardial border  not optimally defined to evaluate regional [place] motion. The left ventricular internal cavity size was mildly to moderately dilated. Left ventricular diastolic parameters are consistent with Grade I diastolic dysfunction (impaired relaxation).  3. Right ventricular systolic function is normal. The right ventricular size is normal. Tricuspid regurgitation signal is inadequate for assessing PA pressure.  4. The mitral valve is abnormal. Trivial mitral valve regurgitation. Mild mitral stenosis. The mean mitral valve gradient is 3.0 mmHg. Moderate mitral annular calcification.  5. The inferior vena cava is normal in size with greater than 50% respiratory variability, suggesting right atrial pressure of 3 mmHg. Comparison(s): Changes from prior study are noted. Aortic valve stensois progressed from moderate (in 2023) to severe now. V max is 4.55 m/s and mean PG 46.3 mm Hg. FINDINGS  Left Ventricle: Left ventricular ejection fraction, by estimation, is 55 to 60%. The left ventricle has normal function. Left ventricular endocardial border not optimally defined to evaluate regional [place] motion. The left ventricular internal cavity size was mildly to moderately dilated. There is no left ventricular hypertrophy. Left ventricular diastolic parameters are consistent with Grade I diastolic dysfunction (impaired relaxation). Normal left ventricular filling pressure. Right Ventricle: The right ventricular size is normal. No increase in right ventricular Howser thickness. Right ventricular systolic function is normal. Tricuspid regurgitation signal is inadequate for assessing PA pressure. Left Atrium: Left atrial size was normal in size. Right Atrium: Right atrial size was normal in size. Pericardium: There is no evidence of pericardial effusion. Mitral Valve: The mitral valve is abnormal. Moderate mitral annular calcification. Trivial mitral valve regurgitation. Mild mitral valve stenosis. MV peak gradient, 5.8 mmHg. The mean mitral  valve gradient is 3.0 mmHg. Tricuspid Valve: The tricuspid valve is grossly normal. Tricuspid valve regurgitation is not demonstrated. No evidence of tricuspid stenosis. Aortic Valve: The aortic valve is calcified. There is severe calcifcation of the aortic valve. There is severe thickening of the aortic valve. Aortic valve regurgitation is trivial. Severe aortic stenosis is present. Aortic valve mean gradient measures 46.3 mmHg. Aortic valve peak gradient measures 82.9 mmHg. Aortic valve area, by VTI measures 0.89 cm. Pulmonic Valve: The pulmonic valve was not well visualized. Pulmonic valve regurgitation is trivial. No evidence of pulmonic stenosis. Aorta: The aortic root and ascending aorta are structurally normal, with no evidence of dilitation. Venous: The inferior vena cava is normal in  size with greater than 50% respiratory variability, suggesting right atrial pressure of 3 mmHg. IAS/Shunts: The interatrial septum was not well visualized.  LEFT VENTRICLE PLAX 2D LVIDd:         6.00 cm      Diastology LVIDs:         4.20 cm      LV e' medial:    5.11 cm/s LV PW:         0.80 cm      LV E/e' medial:  19.3 LV IVS:        0.90 cm      LV e' lateral:   8.38 cm/s LVOT diam:     2.00 cm      LV E/e' lateral: 11.8 LV SV:         80 LV SV Index:   38 LVOT Area:     3.14 cm  LV Volumes (MOD) LV vol d, MOD A2C: 186.0 ml LV vol d, MOD A4C: 182.0 ml LV vol s, MOD A2C: 72.2 ml LV vol s, MOD A4C: 63.4 ml LV SV MOD A2C:     113.8 ml LV SV MOD A4C:     182.0 ml LV SV MOD BP:      119.1 ml RIGHT VENTRICLE             IVC RV Basal diam:  3.90 cm     IVC diam: 1.00 cm RV Mid diam:    2.50 cm RV S prime:     15.20 cm/s TAPSE (M-mode): 3.1 cm LEFT ATRIUM             Index        RIGHT ATRIUM           Index LA diam:        3.70 cm 1.78 cm/m   RA Area:     14.40 cm LA Vol (A2C):   91.6 ml 43.95 ml/m  RA Volume:   35.40 ml  16.99 ml/m LA Vol (A4C):   44.6 ml 21.40 ml/m LA Biplane Vol: 69.0 ml 33.11 ml/m  AORTIC VALVE                      PULMONIC VALVE AV Area (Vmax):    0.87 cm      PV Vmax:       0.98 m/s AV Area (Vmean):   0.86 cm      PV Peak grad:  3.8 mmHg AV Area (VTI):     0.89 cm AV Vmax:           455.33 cm/s AV Vmean:          310.000 cm/s AV VTI:            0.896 m AV Peak Grad:      82.9 mmHg AV Mean Grad:      46.3 mmHg LVOT Vmax:         126.00 cm/s LVOT Vmean:        85.000 cm/s LVOT VTI:          0.255 m LVOT/AV VTI ratio: 0.28  AORTA Ao Root diam: 3.20 cm Ao Asc diam:  3.20 cm MITRAL VALVE MV Area (PHT): 3.26 cm     SHUNTS MV Area VTI:   2.27 cm     Systemic VTI:  0.26 m MV Peak grad:  5.8 mmHg     Systemic Diam: 2.00 cm MV Mean grad:  3.0 mmHg  MV Vmax:       1.20 m/s MV Vmean:      82.0 cm/s MV Decel Time: 233 msec MV E velocity: 98.80 cm/s MV A velocity: 118.00 cm/s MV E/A ratio:  0.84 Vishnu Priya Mallipeddi Electronically signed by Winfield Rast Mallipeddi Signature Date/Time: 09/20/2023/9:56:59 AM    Final    CT HEAD WO CONTRAST ( ) Result Date: 09/20/2023 CLINICAL DATA:  Encephalopathy, right-sided weakness EXAM: CT HEAD WITHOUT CONTRAST TECHNIQUE: Contiguous axial images were obtained from the base of the skull through the vertex without intravenous contrast. RADIATION DOSE REDUCTION: This exam was performed according to the departmental dose-optimization program which includes automated exposure control, adjustment of the mA and/or kV according to patient size and/or use of iterative reconstruction technique. COMPARISON:  None Available. FINDINGS: Brain: Normal anatomic configuration of the brain. Mild periventricular white matter changes are present likely the sequela small vessel ischemia. Tiny remote lacunar infarct noted within the right frontal subcortical white matter. No acute intracranial hemorrhage or infarct. No mass effect or midline shift no abnormal intra or extra-axial mass lesion. Ventricular size is normal. Cerebellum is unremarkable. Vascular: No hyperdense vessel or unexpected  calcification. Skull: Normal. Negative for fracture or focal lesion. Sinuses/Orbits: No acute finding. Other: Mastoid air cells and middle ear cavities are clear. IMPRESSION: 1. No acute intracranial abnormality. 2. Mild senescent change. 3. Tiny remote lacunar infarct within the right frontal subcortical white matter. Electronically Signed   By: Helyn Numbers M.D.   On: 09/20/2023 01:45   CT CHEST ABDOMEN PELVIS W CONTRAST Result Date: 09/19/2023 CLINICAL DATA:  Sepsis fever EXAM: CT CHEST, ABDOMEN, AND PELVIS WITH CONTRAST TECHNIQUE: Multidetector CT imaging of the chest, abdomen and pelvis was performed following the standard protocol during bolus administration of intravenous contrast. RADIATION DOSE REDUCTION: This exam was performed according to the departmental dose-optimization program which includes automated exposure control, adjustment of the mA and/or kV according to patient size and/or use of iterative reconstruction technique. CONTRAST:  OMNIPAQUE IOHEXOL 300 MG/ML  SOLN COMPARISON:  Chest x-ray 09/19/2023, CT 04/28/2021, 02/20/2019 FINDINGS: CT CHEST FINDINGS Cardiovascular: Mild aortic atherosclerosis. No aneurysm. Mitral annular calcification. Aortic valvular calcification. Borderline cardiomegaly. No pericardial effusion Mediastinum/Nodes: Patent trachea. No thyroid mass. No suspicious lymph nodes. Esophagus within normal limits Lungs/Pleura: No acute airspace disease, pleural effusion or pneumothorax. Musculoskeletal: Sternum appears intact. There are degenerative changes of the spine. No acute osseous abnormality CT ABDOMEN PELVIS FINDINGS Hepatobiliary: Liver cirrhosis. No calcified gallstone or biliary dilatation Pancreas: Poorly visible, presumably due to marked atrophy. No inflammation. Spleen: Normal in size without focal abnormality. Adrenals/Urinary Tract: Adrenal glands are normal. Kidneys show no hydronephrosis. Renal cysts for which no imaging follow-up is recommended. Mild  left greater than right perinephric stranding. Possible areas of cortical hypoenhancement involving the kidneys on delayed views but limited by artifact from the patient's arms. There is also motion degradation. The bladder is unremarkable Stomach/Bowel: The stomach is nonenlarged. There is no dilated small bowel. No acute bowel Matchett thickening. Diverticular disease of the colon. Vascular/Lymphatic: Mild aortic atherosclerosis. No aneurysm. No suspicious lymph nodes. Gastroesophageal varices. Collateral vessels in the left upper quadrant. Reproductive: Prostate is unremarkable. Other: Negative for pelvic effusion or free air. Mild skin thickening of the right anterior abdominal Musco Musculoskeletal: Degenerative changes. No acute or suspicious osseous abnormality. IMPRESSION: 1. No CT evidence for acute intrathoracic abnormality. 2. Liver cirrhosis with evidence of portal hypertension including gastroesophageal varices. 3. Diverticular disease of the colon without acute inflammatory process.  4. Mild left greater than right perinephric stranding with possible areas of cortical hypoenhancement involving the kidneys on delayed views but limited by artifact from the patient's arms and motion degradation. Correlate with urinalysis to assess for UTI/pyelonephritis Aortic Atherosclerosis (ICD10-I70.0). Electronically Signed   By: Jasmine Pang M.D.   On: 09/19/2023 21:24   DG Chest Portable 1 View Result Date: 09/19/2023 CLINICAL DATA:  Generalized weakness and fever today. EXAM: PORTABLE CHEST 1 VIEW COMPARISON:  03/10/2022 FINDINGS: The cardiac silhouette remains mildly enlarged. Decreased prominence of the pulmonary vasculature. Mildly prominent interstitial markings with an appearance compatible with chronic interstitial lung disease without superimposed pulmonary edema. No airspace consolidation or pleural fluid. Left shoulder prosthesis. Lower thoracic and upper lumbar spine degenerative changes. IMPRESSION: 1. No  acute abnormality. 2. Mild cardiomegaly and mild chronic interstitial lung disease. Electronically Signed   By: Beckie Salts M.D.   On: 09/19/2023 18:24     Time coordinating discharge: Over 30 minutes    Lewie Chamber, MD  [facility] 09/27/2023, 1:43 PM

## 2023-09-27 NOTE — Progress Notes (Signed)
Peripherally Inserted Central Catheter Placement  The IV Nurse has discussed with the patient and/or persons authorized to consent for the patient, the purpose of this procedure and the potential benefits and risks involved with this procedure.  The benefits include less needle sticks, lab draws from the catheter, and the patient may be discharged home with the catheter. Risks include, but not limited to, infection, bleeding, blood clot (thrombus formation), and puncture of an artery; nerve damage and irregular heartbeat and possibility to perform a PICC exchange if needed/ordered by physician.  Alternatives to this procedure were also discussed.  Bard Power PICC patient education guide, fact sheet on infection prevention and patient information card has been provided to patient /or left at bedside.    PICC Placement Documentation  PICC Single Lumen 09/27/23 Left Basilic 40 cm 0 cm (Active)  Indication for Insertion or Continuance of Line Home intravenous therapies (PICC only) 09/27/23 1004  Exposed Catheter (cm) 0 cm 09/27/23 1004  Site Assessment Clean, Dry, Intact 09/27/23 1004  Line Status Flushed;Saline locked;Blood return noted 09/27/23 1004  Dressing Type Transparent;Securing device 09/27/23 1004  Dressing Status Antimicrobial disc/dressing in place 09/27/23 1004  Line Care Connections checked and tightened 09/27/23 1004  Line Adjustment (NICU/IV Team Only) No 09/27/23 1004  Dressing Change Due 10/04/23 09/27/23 1004       Romie Jumper 09/27/2023, 10:06 AM

## 2023-09-27 NOTE — Plan of Care (Signed)
Problem: Coping: Goal: Ability to adjust to condition or change in health will improve Outcome: Progressing

## 2023-09-27 NOTE — Inpatient Diabetes Management (Addendum)
Inpatient Diabetes Program Recommendations  AACE/ADA: New Consensus Statement on Inpatient Glycemic Control (2015)  Target Ranges:  Prepandial:   less than 140 mg/dL      Peak postprandial:   less than 180 mg/dL (1-2 hours)      Critically ill patients:  140 - 180 mg/dL   Lab Results  Component Value Date   GLUCAP 348 (H) 09/27/2023   HGBA1C 4.4 (L) 09/19/2023    Review of Glycemic Control  Latest Reference Range & Units 09/26/23 12:36 09/26/23 12:39 09/26/23 16:27 09/26/23 21:08 09/27/23 07:27  Glucose-Capillary 70 - 99 mg/dL 161 (H) 096 (H) 045 (H) 276 (H) 348 (H)  (H): Data is abnormally high Diabetes history: Type 1 DM Outpatient Diabetes medications: Toujeo 20-50 units every day, Humalog 20-30 units TID Current orders for Inpatient glycemic control: Semglee 15 units every day, Novolog 5 units TID, Novolog 0-15 units TID & HS  Inpatient Diabetes Program Recommendations:    Consider: - Increasing Semglee 20 units QD -Increasing Novolog 7 units TID (Assuming patient is consuming >50% of meals)  Thanks, Lujean Rave, MSN, RNC-OB Diabetes Coordinator [phone] (8a-5p)

## 2023-09-27 NOTE — TOC Progression Note (Signed)
Transition of Care [facility]) - Progression Note    Patient Details  Name: Tony Cantu MRN: [id number] Date of Birth: 27-Jun-1951  [facility] CM/SW Contact  Delilah Shan, LCSWA Phone Number: 09/27/2023, 1:24 PM  Clinical Narrative:     Jill Side with [facility] informed CSW they received insurance authorization approval for patient. CSW informed MD. Patient has SNF bed at [facility] rehab when medically ready. CSW will continue to follow.  Expected Discharge Plan: Skilled Nursing Facility Barriers to Discharge: Continued Medical Work up  Expected Discharge Plan and Services In-house Referral: Clinical Social Work     Living arrangements for the past 2 months: Single Family Home                                       Social Determinants of Health (SDOH) Interventions SDOH Screenings   Food Insecurity: Patient Unable To Answer (09/20/2023)  Housing: Low Risk  (09/22/2023)  Transportation Needs: Patient Unable To Answer (09/20/2023)  Utilities: Patient Unable To Answer (09/20/2023)  Depression (PHQ2-9): Medium Risk (09/13/2023)  Financial Resource Strain: Low Risk  (06/09/2022)  Social Connections: Patient Unable To Answer (09/20/2023)  Tobacco Use: Medium Risk (09/25/2023)    Readmission Risk Interventions     No data to display

## 2023-09-27 NOTE — Progress Notes (Signed)
Received page on [facility] on-call phone from pt RN indicating pt and his ex-wife, intended [facility] agent, were trying to locate his Advance Directives. Chaplain chart review indicates several conversations have taken place, but it appears pt has not indicated that he was ready to have documents notarized. Chaplain consulted with Liane Comber who had seen him previously today. Shirlee Latch will follow up with pt RN and family.  Maryanna Shape. Carley Hammed, M.Div. [facility] Chaplain Pager [phone] Office [phone]

## 2023-09-27 NOTE — Progress Notes (Signed)
Mobility Specialist Progress Note:    09/27/23 1230  Mobility  Activity Transferred from bed to chair  Level of Assistance Moderate assist, patient does 50-74%  Assistive Device Front wheel walker  Distance Ambulated (ft) 5 ft  Activity Response Tolerated well  Mobility Referral Yes  Mobility visit 1 Mobility  Mobility Specialist Start Time (ACUTE ONLY) 1125  Mobility Specialist Stop Time (ACUTE ONLY) 1137  Mobility Specialist Time Calculation (min) (ACUTE ONLY) 12 min   Pt received in bed agreeable to mobility. Pt needed ModA w/ bed mobility and MinA for STS (from elevated surface). Was able to take a couple steps towards the chair w/o fault. Situated in chair w/ call bell and personal belongings in reach. All needs met. Chair alarm on.  Thompson Grayer Mobility Specialist  Please contact vis Secure Chat or  Rehab Office [phone]

## 2023-09-27 NOTE — Progress Notes (Signed)
Chaplain provided AD documents and education. Pt confirmed he will sign and alert interdisciplinary team when ready for notarization.

## 2023-09-28 DIAGNOSIS — G9340 Encephalopathy, unspecified: Secondary | ICD-10-CM | POA: Diagnosis not present

## 2023-09-28 DIAGNOSIS — B3781 Candidal esophagitis: Secondary | ICD-10-CM | POA: Diagnosis not present

## 2023-09-28 DIAGNOSIS — D500 Iron deficiency anemia secondary to blood loss (chronic): Secondary | ICD-10-CM | POA: Diagnosis not present

## 2023-09-28 DIAGNOSIS — R5381 Other malaise: Secondary | ICD-10-CM | POA: Diagnosis not present

## 2023-09-28 DIAGNOSIS — E1159 Type 2 diabetes mellitus with other circulatory complications: Secondary | ICD-10-CM | POA: Diagnosis not present

## 2023-09-28 DIAGNOSIS — G8191 Hemiplegia, unspecified affecting right dominant side: Secondary | ICD-10-CM | POA: Diagnosis not present

## 2023-09-28 DIAGNOSIS — K2100 Gastro-esophageal reflux disease with esophagitis, without bleeding: Secondary | ICD-10-CM | POA: Diagnosis not present

## 2023-09-28 DIAGNOSIS — A419 Sepsis, unspecified organism: Secondary | ICD-10-CM | POA: Diagnosis not present

## 2023-09-28 DIAGNOSIS — I480 Paroxysmal atrial fibrillation: Secondary | ICD-10-CM | POA: Diagnosis not present

## 2023-09-28 DIAGNOSIS — K7469 Other cirrhosis of liver: Secondary | ICD-10-CM | POA: Diagnosis not present

## 2023-09-28 DIAGNOSIS — J449 Chronic obstructive pulmonary disease, unspecified: Secondary | ICD-10-CM | POA: Diagnosis not present

## 2023-09-28 DIAGNOSIS — E785 Hyperlipidemia, unspecified: Secondary | ICD-10-CM | POA: Diagnosis not present

## 2023-10-02 DIAGNOSIS — R5381 Other malaise: Secondary | ICD-10-CM | POA: Diagnosis not present

## 2023-10-02 DIAGNOSIS — A419 Sepsis, unspecified organism: Secondary | ICD-10-CM | POA: Diagnosis not present

## 2023-10-02 DIAGNOSIS — G9340 Encephalopathy, unspecified: Secondary | ICD-10-CM | POA: Diagnosis not present

## 2023-10-03 DIAGNOSIS — G9340 Encephalopathy, unspecified: Secondary | ICD-10-CM | POA: Diagnosis not present

## 2023-10-03 DIAGNOSIS — D500 Iron deficiency anemia secondary to blood loss (chronic): Secondary | ICD-10-CM | POA: Diagnosis not present

## 2023-10-03 DIAGNOSIS — E1122 Type 2 diabetes mellitus with diabetic chronic kidney disease: Secondary | ICD-10-CM | POA: Diagnosis not present

## 2023-10-03 DIAGNOSIS — N182 Chronic kidney disease, stage 2 (mild): Secondary | ICD-10-CM | POA: Diagnosis not present

## 2023-10-03 DIAGNOSIS — R5381 Other malaise: Secondary | ICD-10-CM | POA: Diagnosis not present

## 2023-10-03 DIAGNOSIS — Z794 Long term (current) use of insulin: Secondary | ICD-10-CM | POA: Diagnosis not present

## 2023-10-03 DIAGNOSIS — E1159 Type 2 diabetes mellitus with other circulatory complications: Secondary | ICD-10-CM | POA: Diagnosis not present

## 2023-10-06 DIAGNOSIS — R051 Acute cough: Secondary | ICD-10-CM | POA: Diagnosis not present

## 2023-10-10 ENCOUNTER — Inpatient Hospital Stay: Payer: BC Managed Care – PPO | Admitting: Infectious Diseases

## 2023-10-10 ENCOUNTER — Telehealth: Payer: Self-pay

## 2023-10-10 NOTE — Telephone Encounter (Signed)
Pt contact, Bettey Mare, called stated that his wife received notification that he has an appt with [place] today for a HFU. She stated that he is currently at [facility] and will not be coming. She was unable to give much more information about his stay there other than he was d/c to [facility] for at least 20 days.   I did inform her that just generally when pts are released to facilities from a hospital setting it is included in their discharge paperwork of their follow up appts, and it is usually coordinated for them to attend.   I did let her know that I will give them a call and coordinate further for his f/u appts. At this time there was no cancellation, for I wanted to be able to confirm with the facility either a better f/u date or a d/c date that the pt can attend.  Called [phone], spoke with Joy, but they were currently transporting a pt and left a VM for transportation.

## 2023-10-13 ENCOUNTER — Ambulatory Visit: Payer: BC Managed Care – PPO | Admitting: Cardiology

## 2023-10-13 ENCOUNTER — Other Ambulatory Visit: Payer: BC Managed Care – PPO

## 2023-10-14 ENCOUNTER — Other Ambulatory Visit: Payer: Self-pay | Admitting: Internal Medicine

## 2023-10-19 ENCOUNTER — Inpatient Hospital Stay: Payer: BC Managed Care – PPO | Admitting: Infectious Diseases

## 2023-10-23 DIAGNOSIS — K7469 Other cirrhosis of liver: Secondary | ICD-10-CM | POA: Diagnosis not present

## 2023-10-23 DIAGNOSIS — N179 Acute kidney failure, unspecified: Secondary | ICD-10-CM | POA: Diagnosis not present

## 2023-10-23 DIAGNOSIS — R4182 Altered mental status, unspecified: Secondary | ICD-10-CM | POA: Diagnosis not present

## 2023-10-23 DIAGNOSIS — E119 Type 2 diabetes mellitus without complications: Secondary | ICD-10-CM | POA: Diagnosis not present

## 2023-10-23 DIAGNOSIS — R410 Disorientation, unspecified: Secondary | ICD-10-CM | POA: Diagnosis not present

## 2023-10-23 DIAGNOSIS — I4891 Unspecified atrial fibrillation: Secondary | ICD-10-CM | POA: Diagnosis not present

## 2023-10-23 DIAGNOSIS — Z794 Long term (current) use of insulin: Secondary | ICD-10-CM | POA: Diagnosis not present

## 2023-10-23 DIAGNOSIS — I10 Essential (primary) hypertension: Secondary | ICD-10-CM | POA: Diagnosis not present

## 2023-10-23 DIAGNOSIS — Z96612 Presence of left artificial shoulder joint: Secondary | ICD-10-CM | POA: Diagnosis not present

## 2023-10-23 DIAGNOSIS — K529 Noninfective gastroenteritis and colitis, unspecified: Secondary | ICD-10-CM | POA: Diagnosis not present

## 2023-10-23 DIAGNOSIS — E785 Hyperlipidemia, unspecified: Secondary | ICD-10-CM | POA: Diagnosis not present

## 2023-10-23 DIAGNOSIS — Z7901 Long term (current) use of anticoagulants: Secondary | ICD-10-CM | POA: Diagnosis not present

## 2023-10-23 DIAGNOSIS — Z79899 Other long term (current) drug therapy: Secondary | ICD-10-CM | POA: Diagnosis not present

## 2023-10-23 DIAGNOSIS — Z87891 Personal history of nicotine dependence: Secondary | ICD-10-CM | POA: Diagnosis not present

## 2023-10-23 DIAGNOSIS — K922 Gastrointestinal hemorrhage, unspecified: Secondary | ICD-10-CM | POA: Diagnosis not present

## 2023-10-23 DIAGNOSIS — I959 Hypotension, unspecified: Secondary | ICD-10-CM | POA: Diagnosis not present

## 2023-10-23 DIAGNOSIS — K7689 Other specified diseases of liver: Secondary | ICD-10-CM | POA: Diagnosis not present

## 2023-10-23 DIAGNOSIS — I2510 Atherosclerotic heart disease of native coronary artery without angina pectoris: Secondary | ICD-10-CM | POA: Diagnosis not present

## 2023-10-23 DIAGNOSIS — K5649 Other impaction of intestine: Secondary | ICD-10-CM | POA: Diagnosis not present

## 2023-10-23 DIAGNOSIS — K7460 Unspecified cirrhosis of liver: Secondary | ICD-10-CM | POA: Diagnosis not present

## 2023-10-23 DIAGNOSIS — R55 Syncope and collapse: Secondary | ICD-10-CM | POA: Diagnosis not present

## 2023-10-23 DIAGNOSIS — R9389 Abnormal findings on diagnostic imaging of other specified body structures: Secondary | ICD-10-CM | POA: Diagnosis not present

## 2023-10-23 DIAGNOSIS — Z96611 Presence of right artificial shoulder joint: Secondary | ICD-10-CM | POA: Diagnosis not present

## 2023-10-23 DIAGNOSIS — Z1152 Encounter for screening for COVID-19: Secondary | ICD-10-CM | POA: Diagnosis not present

## 2023-10-24 ENCOUNTER — Encounter (HOSPITAL_COMMUNITY): Payer: Self-pay

## 2023-10-24 ENCOUNTER — Other Ambulatory Visit: Payer: Self-pay

## 2023-10-24 ENCOUNTER — Inpatient Hospital Stay (HOSPITAL_COMMUNITY): Payer: Medicare Other

## 2023-10-24 ENCOUNTER — Inpatient Hospital Stay (HOSPITAL_COMMUNITY)
Admission: EM | Admit: 2023-10-24 | Discharge: 2023-11-03 | DRG: 637 | Disposition: A | Discharge status: Skilled Nursing Facility | Payer: Medicare Other | Source: Other Acute Inpatient Hospital | Attending: Internal Medicine | Admitting: Internal Medicine

## 2023-10-24 DIAGNOSIS — D638 Anemia in other chronic diseases classified elsewhere: Secondary | ICD-10-CM | POA: Diagnosis present

## 2023-10-24 DIAGNOSIS — Z820 Family history of epilepsy and other diseases of the nervous system: Secondary | ICD-10-CM

## 2023-10-24 DIAGNOSIS — R579 Shock, unspecified: Secondary | ICD-10-CM | POA: Diagnosis not present

## 2023-10-24 DIAGNOSIS — R2681 Unsteadiness on feet: Secondary | ICD-10-CM | POA: Diagnosis not present

## 2023-10-24 DIAGNOSIS — Z96653 Presence of artificial knee joint, bilateral: Secondary | ICD-10-CM | POA: Diagnosis present

## 2023-10-24 DIAGNOSIS — K7030 Alcoholic cirrhosis of liver without ascites: Secondary | ICD-10-CM | POA: Diagnosis not present

## 2023-10-24 DIAGNOSIS — Z96612 Presence of left artificial shoulder joint: Secondary | ICD-10-CM | POA: Diagnosis not present

## 2023-10-24 DIAGNOSIS — Z882 Allergy status to sulfonamides status: Secondary | ICD-10-CM

## 2023-10-24 DIAGNOSIS — I2489 Other forms of acute ischemic heart disease: Secondary | ICD-10-CM | POA: Diagnosis not present

## 2023-10-24 DIAGNOSIS — K766 Portal hypertension: Secondary | ICD-10-CM | POA: Diagnosis present

## 2023-10-24 DIAGNOSIS — D62 Acute posthemorrhagic anemia: Secondary | ICD-10-CM

## 2023-10-24 DIAGNOSIS — E1122 Type 2 diabetes mellitus with diabetic chronic kidney disease: Secondary | ICD-10-CM | POA: Diagnosis not present

## 2023-10-24 DIAGNOSIS — K920 Hematemesis: Secondary | ICD-10-CM | POA: Diagnosis not present

## 2023-10-24 DIAGNOSIS — Z860102 Personal history of hyperplastic colon polyps: Secondary | ICD-10-CM

## 2023-10-24 DIAGNOSIS — E1010 Type 1 diabetes mellitus with ketoacidosis without coma: Secondary | ICD-10-CM | POA: Diagnosis not present

## 2023-10-24 DIAGNOSIS — K529 Noninfective gastroenteritis and colitis, unspecified: Secondary | ICD-10-CM | POA: Diagnosis not present

## 2023-10-24 DIAGNOSIS — Z9689 Presence of other specified functional implants: Secondary | ICD-10-CM | POA: Diagnosis present

## 2023-10-24 DIAGNOSIS — Z79899 Other long term (current) drug therapy: Secondary | ICD-10-CM

## 2023-10-24 DIAGNOSIS — K921 Melena: Secondary | ICD-10-CM | POA: Diagnosis not present

## 2023-10-24 DIAGNOSIS — E0810 Diabetes mellitus due to underlying condition with ketoacidosis without coma: Secondary | ICD-10-CM | POA: Diagnosis not present

## 2023-10-24 DIAGNOSIS — D696 Thrombocytopenia, unspecified: Secondary | ICD-10-CM | POA: Diagnosis present

## 2023-10-24 DIAGNOSIS — I959 Hypotension, unspecified: Secondary | ICD-10-CM | POA: Diagnosis not present

## 2023-10-24 DIAGNOSIS — G40909 Epilepsy, unspecified, not intractable, without status epilepticus: Secondary | ICD-10-CM | POA: Diagnosis present

## 2023-10-24 DIAGNOSIS — R571 Hypovolemic shock: Secondary | ICD-10-CM | POA: Diagnosis not present

## 2023-10-24 DIAGNOSIS — I10 Essential (primary) hypertension: Secondary | ICD-10-CM | POA: Diagnosis not present

## 2023-10-24 DIAGNOSIS — Z794 Long term (current) use of insulin: Secondary | ICD-10-CM

## 2023-10-24 DIAGNOSIS — Z888 Allergy status to other drugs, medicaments and biological substances status: Secondary | ICD-10-CM

## 2023-10-24 DIAGNOSIS — N182 Chronic kidney disease, stage 2 (mild): Secondary | ICD-10-CM

## 2023-10-24 DIAGNOSIS — G4730 Sleep apnea, unspecified: Secondary | ICD-10-CM | POA: Diagnosis present

## 2023-10-24 DIAGNOSIS — N179 Acute kidney failure, unspecified: Secondary | ICD-10-CM | POA: Diagnosis present

## 2023-10-24 DIAGNOSIS — K5641 Fecal impaction: Secondary | ICD-10-CM | POA: Diagnosis present

## 2023-10-24 DIAGNOSIS — R9389 Abnormal findings on diagnostic imaging of other specified body structures: Secondary | ICD-10-CM | POA: Diagnosis not present

## 2023-10-24 DIAGNOSIS — E119 Type 2 diabetes mellitus without complications: Secondary | ICD-10-CM | POA: Diagnosis not present

## 2023-10-24 DIAGNOSIS — G4089 Other seizures: Secondary | ICD-10-CM | POA: Diagnosis not present

## 2023-10-24 DIAGNOSIS — E1069 Type 1 diabetes mellitus with other specified complication: Secondary | ICD-10-CM | POA: Diagnosis not present

## 2023-10-24 DIAGNOSIS — Z825 Family history of asthma and other chronic lower respiratory diseases: Secondary | ICD-10-CM

## 2023-10-24 DIAGNOSIS — I480 Paroxysmal atrial fibrillation: Secondary | ICD-10-CM | POA: Diagnosis not present

## 2023-10-24 DIAGNOSIS — Z8249 Family history of ischemic heart disease and other diseases of the circulatory system: Secondary | ICD-10-CM

## 2023-10-24 DIAGNOSIS — N400 Enlarged prostate without lower urinary tract symptoms: Secondary | ICD-10-CM | POA: Diagnosis present

## 2023-10-24 DIAGNOSIS — Z87442 Personal history of urinary calculi: Secondary | ICD-10-CM

## 2023-10-24 DIAGNOSIS — E43 Unspecified severe protein-calorie malnutrition: Secondary | ICD-10-CM | POA: Insufficient documentation

## 2023-10-24 DIAGNOSIS — E10649 Type 1 diabetes mellitus with hypoglycemia without coma: Secondary | ICD-10-CM | POA: Diagnosis not present

## 2023-10-24 DIAGNOSIS — R578 Other shock: Secondary | ICD-10-CM

## 2023-10-24 DIAGNOSIS — Z860101 Personal history of adenomatous and serrated colon polyps: Secondary | ICD-10-CM

## 2023-10-24 DIAGNOSIS — Z7901 Long term (current) use of anticoagulants: Secondary | ICD-10-CM

## 2023-10-24 DIAGNOSIS — E785 Hyperlipidemia, unspecified: Secondary | ICD-10-CM | POA: Diagnosis not present

## 2023-10-24 DIAGNOSIS — R55 Syncope and collapse: Secondary | ICD-10-CM | POA: Diagnosis not present

## 2023-10-24 DIAGNOSIS — J449 Chronic obstructive pulmonary disease, unspecified: Secondary | ICD-10-CM | POA: Diagnosis not present

## 2023-10-24 DIAGNOSIS — R54 Age-related physical debility: Secondary | ICD-10-CM | POA: Diagnosis present

## 2023-10-24 DIAGNOSIS — K7469 Other cirrhosis of liver: Secondary | ICD-10-CM | POA: Diagnosis not present

## 2023-10-24 DIAGNOSIS — G4733 Obstructive sleep apnea (adult) (pediatric): Secondary | ICD-10-CM | POA: Diagnosis present

## 2023-10-24 DIAGNOSIS — Z7401 Bed confinement status: Secondary | ICD-10-CM | POA: Diagnosis not present

## 2023-10-24 DIAGNOSIS — Z87891 Personal history of nicotine dependence: Secondary | ICD-10-CM

## 2023-10-24 DIAGNOSIS — Z803 Family history of malignant neoplasm of breast: Secondary | ICD-10-CM

## 2023-10-24 DIAGNOSIS — D539 Nutritional anemia, unspecified: Secondary | ICD-10-CM | POA: Diagnosis present

## 2023-10-24 DIAGNOSIS — E861 Hypovolemia: Secondary | ICD-10-CM | POA: Diagnosis present

## 2023-10-24 DIAGNOSIS — I350 Nonrheumatic aortic (valve) stenosis: Secondary | ICD-10-CM | POA: Diagnosis present

## 2023-10-24 DIAGNOSIS — Z881 Allergy status to other antibiotic agents status: Secondary | ICD-10-CM

## 2023-10-24 DIAGNOSIS — R410 Disorientation, unspecified: Secondary | ICD-10-CM | POA: Diagnosis not present

## 2023-10-24 DIAGNOSIS — I8500 Esophageal varices without bleeding: Secondary | ICD-10-CM | POA: Diagnosis not present

## 2023-10-24 DIAGNOSIS — K922 Gastrointestinal hemorrhage, unspecified: Principal | ICD-10-CM | POA: Diagnosis present

## 2023-10-24 DIAGNOSIS — K7460 Unspecified cirrhosis of liver: Secondary | ICD-10-CM | POA: Diagnosis not present

## 2023-10-24 DIAGNOSIS — R531 Weakness: Secondary | ICD-10-CM | POA: Diagnosis not present

## 2023-10-24 DIAGNOSIS — F5101 Primary insomnia: Secondary | ICD-10-CM

## 2023-10-24 DIAGNOSIS — M6259 Muscle wasting and atrophy, not elsewhere classified, multiple sites: Secondary | ICD-10-CM | POA: Diagnosis not present

## 2023-10-24 DIAGNOSIS — J45909 Unspecified asthma, uncomplicated: Secondary | ICD-10-CM | POA: Diagnosis not present

## 2023-10-24 DIAGNOSIS — I4891 Unspecified atrial fibrillation: Secondary | ICD-10-CM

## 2023-10-24 DIAGNOSIS — Z96611 Presence of right artificial shoulder joint: Secondary | ICD-10-CM | POA: Diagnosis present

## 2023-10-24 DIAGNOSIS — Z6829 Body mass index (BMI) 29.0-29.9, adult: Secondary | ICD-10-CM

## 2023-10-24 DIAGNOSIS — Z833 Family history of diabetes mellitus: Secondary | ICD-10-CM

## 2023-10-24 DIAGNOSIS — G9340 Encephalopathy, unspecified: Secondary | ICD-10-CM | POA: Diagnosis not present

## 2023-10-24 DIAGNOSIS — Z8711 Personal history of peptic ulcer disease: Secondary | ICD-10-CM

## 2023-10-24 DIAGNOSIS — Z1152 Encounter for screening for COVID-19: Secondary | ICD-10-CM | POA: Diagnosis not present

## 2023-10-24 DIAGNOSIS — F419 Anxiety disorder, unspecified: Secondary | ICD-10-CM | POA: Diagnosis present

## 2023-10-24 DIAGNOSIS — M6281 Muscle weakness (generalized): Secondary | ICD-10-CM | POA: Diagnosis not present

## 2023-10-24 DIAGNOSIS — D500 Iron deficiency anemia secondary to blood loss (chronic): Secondary | ICD-10-CM | POA: Diagnosis not present

## 2023-10-24 DIAGNOSIS — E1110 Type 2 diabetes mellitus with ketoacidosis without coma: Principal | ICD-10-CM

## 2023-10-24 LAB — BASIC METABOLIC PANEL
Anion gap: 21 — ABNORMAL HIGH (ref 5–15)
Anion gap: 24 — ABNORMAL HIGH (ref 5–15)
BUN: 31 mg/dL — ABNORMAL HIGH (ref 8–23)
BUN: 30 mg/dL — ABNORMAL HIGH (ref 8–23)
CO2: 15 mmol/L — ABNORMAL LOW (ref 22–32)
CO2: 14 mmol/L — ABNORMAL LOW (ref 22–32)
Calcium: 10 mg/dL (ref 8.9–10.3)
Calcium: 10.3 mg/dL (ref 8.9–10.3)
Chloride: 98 mmol/L (ref 98–111)
Chloride: 98 mmol/L (ref 98–111)
Creatinine, Ser: 2.09 mg/dL — ABNORMAL HIGH (ref 0.61–1.24)
Creatinine, Ser: 1.83 mg/dL — ABNORMAL HIGH (ref 0.61–1.24)
GFR, Estimated: 33 mL/min — ABNORMAL LOW (ref >60)
GFR, Estimated: 38 mL/min — ABNORMAL LOW (ref >60)
Glucose, Bld: 467 mg/dL — ABNORMAL HIGH (ref 70–99)
Glucose, Bld: 470 mg/dL — ABNORMAL HIGH (ref 70–99)
Potassium: 4.6 mmol/L (ref 3.5–5.1)
Potassium: 5 mmol/L (ref 3.5–5.1)
Sodium: 134 mmol/L — ABNORMAL LOW (ref 135–145)
Sodium: 136 mmol/L (ref 135–145)

## 2023-10-24 LAB — IRON AND TIBC
Iron: 101 ug/dL (ref 45–182)
Saturation Ratios: 38 % (ref 17.9–39.5)
TIBC: 266 ug/dL (ref 250–450)
UIBC: 165 ug/dL

## 2023-10-24 LAB — COMPREHENSIVE METABOLIC PANEL
ALT: 40 U/L (ref 0–44)
AST: 42 U/L — ABNORMAL HIGH (ref 15–41)
Albumin: 2.5 g/dL — ABNORMAL LOW (ref 3.5–5.0)
Alkaline Phosphatase: 123 U/L (ref 38–126)
Anion gap: 23 — ABNORMAL HIGH (ref 5–15)
BUN: 30 mg/dL — ABNORMAL HIGH (ref 8–23)
CO2: 14 mmol/L — ABNORMAL LOW (ref 22–32)
Calcium: 10.1 mg/dL (ref 8.9–10.3)
Chloride: 98 mmol/L (ref 98–111)
Creatinine, Ser: 2.07 mg/dL — ABNORMAL HIGH (ref 0.61–1.24)
GFR, Estimated: 33 mL/min — ABNORMAL LOW (ref >60)
Glucose, Bld: 478 mg/dL — ABNORMAL HIGH (ref 70–99)
Potassium: 4.6 mmol/L (ref 3.5–5.1)
Sodium: 135 mmol/L (ref 135–145)
Total Bilirubin: 1.5 mg/dL — ABNORMAL HIGH (ref 0.0–1.2)
Total Protein: 6.8 g/dL (ref 6.5–8.1)

## 2023-10-24 LAB — BETA-HYDROXYBUTYRIC ACID
Beta-Hydroxybutyric Acid: >8 mmol/L — ABNORMAL HIGH (ref 0.05–0.27)
Beta-Hydroxybutyric Acid: 6.81 mmol/L — ABNORMAL HIGH (ref 0.05–0.27)

## 2023-10-24 LAB — CBC
HCT: 36 % — ABNORMAL LOW (ref 39.0–52.0)
HCT: 37.1 % — ABNORMAL LOW (ref 39.0–52.0)
Hemoglobin: 12.1 g/dL — ABNORMAL LOW (ref 13.0–17.0)
Hemoglobin: 12.5 g/dL — ABNORMAL LOW (ref 13.0–17.0)
MCH: 35 pg — ABNORMAL HIGH (ref 26.0–34.0)
MCH: 36.1 pg — ABNORMAL HIGH (ref 26.0–34.0)
MCHC: 33.6 g/dL (ref 30.0–36.0)
MCHC: 33.7 g/dL (ref 30.0–36.0)
MCV: 104 fL — ABNORMAL HIGH (ref 80.0–100.0)
MCV: 107.2 fL — ABNORMAL HIGH (ref 80.0–100.0)
Platelets: 162 10*3/uL (ref 150–400)
Platelets: 167 10*3/uL (ref 150–400)
RBC: 3.46 MIL/uL — ABNORMAL LOW (ref 4.22–5.81)
RBC: 3.46 MIL/uL — ABNORMAL LOW (ref 4.22–5.81)
RDW: 16.9 % — ABNORMAL HIGH (ref 11.5–15.5)
RDW: 17.1 % — ABNORMAL HIGH (ref 11.5–15.5)
WBC: 8.6 10*3/uL (ref 4.0–10.5)
WBC: 8.3 10*3/uL (ref 4.0–10.5)
nRBC: 0 % (ref 0.0–0.2)
nRBC: 0 % (ref 0.0–0.2)

## 2023-10-24 LAB — GLUCOSE, CAPILLARY
Glucose-Capillary: 350 mg/dL — ABNORMAL HIGH (ref 70–99)
Glucose-Capillary: 360 mg/dL — ABNORMAL HIGH (ref 70–99)
Glucose-Capillary: 416 mg/dL — ABNORMAL HIGH (ref 70–99)
Glucose-Capillary: 469 mg/dL — ABNORMAL HIGH (ref 70–99)
Glucose-Capillary: 492 mg/dL — ABNORMAL HIGH (ref 70–99)

## 2023-10-24 LAB — MRSA NEXT GEN BY PCR, NASAL: MRSA by PCR Next Gen: NOT DETECTED

## 2023-10-24 LAB — TROPONIN I (HIGH SENSITIVITY)
Troponin I (High Sensitivity): 37 ng/L — ABNORMAL HIGH (ref <18)
Troponin I (High Sensitivity): 49 ng/L — ABNORMAL HIGH (ref <18)

## 2023-10-24 LAB — LACTIC ACID, PLASMA
Lactic Acid, Venous: 3.8 mmol/L (ref 0.5–1.9)
Lactic Acid, Venous: 3.9 mmol/L (ref 0.5–1.9)

## 2023-10-24 LAB — PROTIME-INR
INR: 1.2 (ref 0.8–1.2)
Prothrombin Time: 15.5 s — ABNORMAL HIGH (ref 11.4–15.2)

## 2023-10-24 LAB — FERRITIN: Ferritin: 130 ng/mL (ref 24–336)

## 2023-10-24 MED ORDER — POTASSIUM CHLORIDE 10 MEQ/100ML IV SOLN
10.0000 meq | INTRAVENOUS | Status: AC
  Administered 2023-10-24 (×2): 10 meq via INTRAVENOUS
  Filled 2023-10-24 (×2): qty 100

## 2023-10-24 MED ORDER — SODIUM CHLORIDE 0.9 % IV SOLN
2.0000 g | INTRAVENOUS | Status: DC
  Administered 2023-10-24 – 2023-10-25 (×2): 2 g via INTRAVENOUS
  Filled 2023-10-24 (×2): qty 20

## 2023-10-24 MED ORDER — SODIUM CHLORIDE 0.9 % IV SOLN
50.0000 ug/h | INTRAVENOUS | Status: DC
  Administered 2023-10-24 – 2023-10-25 (×2): 50 ug/h via INTRAVENOUS
  Filled 2023-10-24 (×3): qty 1

## 2023-10-24 MED ORDER — PANTOPRAZOLE SODIUM 40 MG IV SOLR
40.0000 mg | Freq: Two times a day (BID) | INTRAVENOUS | Status: DC
  Administered 2023-10-24 – 2023-10-25 (×3): 40 mg via INTRAVENOUS
  Filled 2023-10-24 (×3): qty 10

## 2023-10-24 MED ORDER — ACETAMINOPHEN 325 MG PO TABS: 650.0000 mg | ORAL_TABLET | ORAL | Status: DC | PRN

## 2023-10-24 MED ORDER — ATORVASTATIN CALCIUM 40 MG PO TABS
40.0000 mg | ORAL_TABLET | Freq: Every day | ORAL | Status: DC
  Administered 2023-10-25 – 2023-11-03 (×10): 40 mg via ORAL
  Filled 2023-10-24 (×10): qty 1

## 2023-10-24 MED ORDER — SODIUM CHLORIDE 0.9% FLUSH: 10.0000 mL | INTRAVENOUS | Status: DC | PRN

## 2023-10-24 MED ORDER — DEXTROSE 50 % IV SOLN: 0.0000 mL | INTRAVENOUS | Status: DC | PRN

## 2023-10-24 MED ORDER — DEXTROSE IN LACTATED RINGERS 5 % IV SOLN: INTRAVENOUS | Status: DC

## 2023-10-24 MED ORDER — OCTREOTIDE LOAD VIA INFUSION
50.0000 ug | Freq: Once | INTRAVENOUS | Status: AC
  Administered 2023-10-24: 50 ug via INTRAVENOUS
  Filled 2023-10-24: qty 25

## 2023-10-24 MED ORDER — CHLORHEXIDINE GLUCONATE CLOTH 2 % EX PADS
6.0000 | MEDICATED_PAD | Freq: Every day | CUTANEOUS | Status: DC
  Administered 2023-10-24 – 2023-11-03 (×11): 6 via TOPICAL

## 2023-10-24 MED ORDER — POLYETHYLENE GLYCOL 3350 17 G PO PACK
17.0000 g | PACK | Freq: Every day | ORAL | Status: DC | PRN
  Administered 2023-10-26: 17 g via ORAL
  Filled 2023-10-24: qty 1

## 2023-10-24 MED ORDER — DOCUSATE SODIUM 100 MG PO CAPS
100.0000 mg | ORAL_CAPSULE | Freq: Two times a day (BID) | ORAL | Status: DC | PRN
  Administered 2023-10-26 – 2023-10-27 (×2): 100 mg via ORAL
  Filled 2023-10-24 (×2): qty 1

## 2023-10-24 MED ORDER — SODIUM CHLORIDE 0.9% FLUSH
10.0000 mL | Freq: Two times a day (BID) | INTRAVENOUS | Status: DC
Start: 2023-10-24 — End: 2023-11-03
  Administered 2023-10-24 – 2023-10-31 (×9): 10 mL
  Administered 2023-10-31: 30 mL
  Administered 2023-11-01: 10 mL

## 2023-10-24 MED ORDER — DEXMEDETOMIDINE HCL IN NACL 400 MCG/100ML IV SOLN
0.0000 ug/kg/h | INTRAVENOUS | Status: DC
  Administered 2023-10-24: 0.4 ug/kg/h via INTRAVENOUS
  Administered 2023-10-25: 0.7 ug/kg/h via INTRAVENOUS
  Filled 2023-10-24 (×3): qty 100

## 2023-10-24 MED ORDER — LACTATED RINGERS IV SOLN: INTRAVENOUS | Status: DC

## 2023-10-24 MED ORDER — LACTATED RINGERS IV BOLUS
20.0000 mL/kg | Freq: Once | INTRAVENOUS | Status: AC
  Administered 2023-10-24: 1626 mL via INTRAVENOUS

## 2023-10-24 MED ORDER — SODIUM CHLORIDE 0.9% FLUSH
10.0000 mL | Freq: Two times a day (BID) | INTRAVENOUS | Status: DC
  Administered 2023-10-24 – 2023-10-30 (×11): 10 mL
  Administered 2023-10-31: 30 mL
  Administered 2023-10-31 – 2023-11-01 (×2): 10 mL

## 2023-10-24 MED ORDER — INSULIN REGULAR(HUMAN) IN NACL 100-0.9 UT/100ML-% IV SOLN
INTRAVENOUS | Status: DC
  Administered 2023-10-24: 8.6 [IU]/h via INTRAVENOUS
  Filled 2023-10-24: qty 100

## 2023-10-24 MED ORDER — NOREPINEPHRINE 4 MG/250ML-% IV SOLN
0.0000 ug/min | INTRAVENOUS | Status: DC
  Administered 2023-10-24: 14 ug/min via INTRAVENOUS
  Administered 2023-10-24: 22 ug/min via INTRAVENOUS
  Administered 2023-10-25: 20 ug/min via INTRAVENOUS
  Administered 2023-10-25: 18 ug/min via INTRAVENOUS
  Administered 2023-10-25: 8 ug/min via INTRAVENOUS
  Administered 2023-10-26: 11 ug/min via INTRAVENOUS
  Administered 2023-10-26 – 2023-10-27 (×3): 7 ug/min via INTRAVENOUS
  Filled 2023-10-24 (×10): qty 250

## 2023-10-24 MED ORDER — MONTELUKAST SODIUM 10 MG PO TABS
10.0000 mg | ORAL_TABLET | Freq: Every day | ORAL | Status: DC
  Administered 2023-10-25 – 2023-11-02 (×9): 10 mg via ORAL
  Filled 2023-10-24 (×9): qty 1

## 2023-10-24 NOTE — Progress Notes (Signed)
 Peripherally Inserted Central Catheter Placement  The IV Nurse has discussed with the patient and/or persons authorized to consent for the patient, the purpose of this procedure and the potential benefits and risks involved with this procedure.  The benefits include less needle sticks, lab draws from the catheter, and the patient may be discharged home with the catheter. Risks include, but not limited to, infection, bleeding, blood clot (thrombus formation), and puncture of an artery; nerve damage and irregular heartbeat and possibility to perform a PICC exchange if needed/ordered by physician.  Alternatives to this procedure were also discussed.  Bard Power PICC patient education guide, fact sheet on infection prevention and patient information card has been provided to patient /or left at bedside.  PICC exchange single lumen to triple lumen.  PICC Placement Documentation  PICC Triple Lumen 10/24/23 Left Basilic 40 cm 0 cm (Active)  Indication for Insertion or Continuance of Line Vasoactive infusions 10/24/23 2126  Exposed Catheter (cm) 0 cm 10/24/23 2126  Site Assessment Clean, Dry, Intact 10/24/23 2126  Lumen #1 Status Flushed;Saline locked;Blood return noted 10/24/23 2126  Lumen #2 Status Flushed;Saline locked;Blood return noted 10/24/23 2126  Lumen #3 Status Flushed;Saline locked;Blood return noted 10/24/23 2126  Dressing Type Transparent;Securing device 10/24/23 2126  Dressing Status Antimicrobial disc/dressing in place;Clean, Dry, Intact 10/24/23 2126  Line Care Connections checked and tightened 10/24/23 2126  Line Adjustment (NICU/IV Team Only) No 10/24/23 2126  Dressing Intervention New dressing;Adhesive placed at insertion site (IV team only) 10/24/23 2126  Dressing Change Due 10/31/23 10/24/23 2126       Latrelle Fuston, Lajean Manes 10/24/2023, 9:28 PM

## 2023-10-24 NOTE — Progress Notes (Deleted)
  Cardiology Office Note:  .   Date:  10/24/2023  ID:  Tony Cantu, DOB August 15, 1951, MRN [id number] PCP: [name], DO  [facility] Providers Cardiologist:  Rollene Rotunda, MD { Click to update primary MD,subspecialty MD or APP then REFRESH:1}   History of Present Illness: .   Tony Cantu is a 73 y.o. male ***  ROS: ***  Studies Reviewed: .        *** Risk Assessment/Calculations:   {Does this patient have ATRIAL FIBRILLATION?:[phone]}         Physical Exam:   VS:  There were no vitals taken for this visit.   Wt Readings from Last 3 Encounters:  10/24/23 179 lb 3.7 oz (81.3 kg)  09/20/23 203 lb 11.3 oz (92.4 kg)  09/13/23 209 lb 12.8 oz (95.2 kg)    GEN: Well nourished, well developed in no acute distress NECK: No JVD; No carotid bruits CARDIAC: ***RRR, no murmurs, rubs, gallops RESPIRATORY:  Clear to auscultation without rales, wheezing or rhonchi  ABDOMEN: Soft, non-tender, non-distended EXTREMITIES:  No edema; No deformity   ASSESSMENT AND PLAN: .   ***    {Are you ordering a CV Procedure (e.g. stress test, cath, DCCV, TEE, etc)?   Press F2        :[id number]}  Dispo: ***  Signed, Jonita Albee, PA-C

## 2023-10-24 NOTE — Hospital Course (Addendum)
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 He was discharged on 1/29 with PICC line to complete 4 weeks of IV ceftriaxone with EOT 10/18/2023 and 3 weeks of p.o. Fluconazole. He was supposed to follow up with ID, however per chart review, he did not. His current clinical findings are more likely consistent with hypovolemic shock 2/2 portal gastropathy and gastroesophageal varices leading to black tarry stools, syncope and generalized weakness.   - IV Rocephin for SBP  - IV PPI BID - IV Octriode  - LR bolus and infusion  - GI on board, possible EGD  - Trend Lactic acids - Follow up on UA - Follow up on CMET   AKI  Cr 2.27 baseline 0.8-0.9, BUN 42.  - LR bolus and infusion - Trend Cr - Avoid nephrotoxic agents    Hx of Afib on Eliquis Severe aortic stenosis  - Hold in the setting of suspecting UGIB  T1DM Suspecting DKA Elevated AG >15, BG 469.  - Start Endo tool - Follow up on Beta hydroxybutyric acid  - Monitor AG to close x 2 [AG < 12]    Elevated troponin  Demand Ischemia  Trop 62 likely demand ischemia in the setting of hypovolemia -  trend trops   Hx of IDA  - Hgb  - Follow up Ferritin and iron studies    OSA on CPAP    Seizure  - Restart on Keppra

## 2023-10-24 NOTE — Progress Notes (Signed)
 eLink Physician-Brief Progress Note Patient Name: Tony Cantu DOB: 1951-05-31 MRN: [id number]   Date of Service  10/24/2023  HPI/Events of Note  Notified by RN that patient is restless and agitated, pulling at lines and tubes.  He just had PICC line exchanged.  He has mitts in place which he is actively trying to remove. He attempts to get out of bed.  eICU Interventions  Start on precedex.      Intervention Category Minor Interventions: Agitation / anxiety - evaluation and management  Larinda Buttery 10/24/2023, 10:19 PM  6:29 AM Notified by endotool to transition to SQ Insulin.  Pt is NPO and will likely have en upper endoscopy.   Plan> Keep on insulin gtt for now.

## 2023-10-24 NOTE — Progress Notes (Signed)
 IVT consult for multiple PIVs due to multiple incompatible infusions. This RN to bedside, pt with SL PICC currently infusing.  Site assessed, please see charting for details. Plan will be for PICC exchange 2/25.  Unable to gain additional PIV access in RFA.  Primary RN aware.

## 2023-10-24 NOTE — H&P (Addendum)
 NAME:  Tony Cantu, MRN:  [id number], DOB:  04/14/1951, LOS: 0 ADMISSION DATE:  10/24/2023, CONSULTATION DATE:  10/23/2023 REFERRING MD:  Gwenyth Bouillon, CHIEF COMPLAINT:  syncope    History of Present Illness:  This is a 73 y.o. male with hx of type 1 diabetes, A-fib on Eliquis, cirrhosis, seizure disorder, aortic stenosis, hypertension, peptic ulcer disease, OA with bilateral knee replacement, polyclonal gammopathy, OSA on CPAP brought in by EMS from [facility] to [facility] ED [facility] ED) on 2/24 for syncope. Per [facility] note: The ED provider had called [facility] rehab who notified that the patient had a syncopal episode that was not witnessed. Patient was found in his wheelchair, no fall. Patient was unresponsive for 10 to 15 minutes with low blood pressure.  He was brought to the ED with altered mental status and lethargic.  Initial vitals showed blood pressure of 67/41, bradycardia 55, afebrile.  Labs were notable for creatinine 2.27, troponin 67, lactate 6.8. BNP 639.  CT CAP showed findings of mild fecal impaction with possible sterile coral colitis.  Rectal exam notable for frank melena, no overt bleed with guaiac positive.  Patient remained hypotensive even after fluid resuscitation, he was started on Levophed drip.  EDP consulted general surgery who recommended no surgical evaluation at this time.  EDP also consulted GI at [facility] health who recommended holding blood thinners and plan to see him in the morning.  Patient is brought to [facility] today 2/25, evaluated at bedside. Poor historian but oriented to self and place. Reports about 2 weeks of generalized weakness and fatigue. Some lightheadedness but no dizziness. Endorse cough with notably blood. Endorses melanotic stools. Denies chest pain, SOB, abdominal pain, n/v. Poor appetite for past few weeks.  Of note: 01/29 recently discharged from [facility] for Sepsis Secondary Streptococcus Salivarius Bacteremia and esophogeal candidiasis with PICC line  to complete 4 weeks of IV ceftriaxone with end of treatment date 10/18/2023 and 3 weeks of p.o. Fluconazole.   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   Significant Hospital Events: Including procedures, antibiotic start and stop dates in addition to other pertinent events   2/25 transfer from [facility]  Interim History / Subjective:  See above  Objective   Blood pressure 109/60, temperature 98.2 F (36.8 C), temperature source Oral, resp. rate (!) 26, weight 81.3 kg.       No intake or output data in the 24 hours ending 10/24/23 1753 Filed Weights   10/24/23 1709  Weight: 81.3 kg    Examination: General: alert, in no acute distress HENT: [place]/AT Lungs: CTAB, normal effort on  Cardiovascular: regular rate and rhythm, systolic murmur at RUSB Abdomen: bowel sounds present, soft, non-tender, non-distended Extremities: no LE edema Neuro: A&O x 2 to self and place, moves all extremities   Resolved Hospital Problem list     Assessment & Plan:  Shock, likely hypovolemic Syncope Hematemesis and melena Suspecting 2/2 UGIB Hx of Esophageal candidiasis Hx of Portal hypertensive gastropathy Hx of 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 He was discharged on 1/29  with PICC line to complete 4 weeks of IV ceftriaxone with EOT 10/18/2023 and 3 weeks of p.o. Fluconazole. He was supposed to follow up with ID, however per chart review, he did not. His current clinical  findings are more likely consistent with hypovolemic shock 2/2 portal gastropathy and gastroesophageal varices leading to black tarry stools, syncope and generalized weakness.   - IV Rocephin for SBP  - IV PPI BID - IV Octriode  - LR bolus and infusion  - IV Levophed for MAP > 65 - GI to evaluate tomorrow AM, possible EGD  - Trend Lactic acids - Follow up on UA - Follow up on CMET - Follow up on PT/INR - NPO until GI eval   T1DM Suspecting DKA Elevated AG >15, BG 469.  - Start Endo tool - Follow up on Beta hydroxybutyric acid  - Monitor AG to close x 2 [AG < 12]  - NPO    AKI  Cr 2.27 baseline 0.8-0.9, BUN 42.  - LR bolus and infusion - Trend Cr - Avoid nephrotoxic agents  - Strict Is & Os   Hx of Afib on Eliquis Severe aortic stenosis  - Home medication includes Dilt 30 mg BID and Toprol XL 25 mg daily  - Presently NSR and rate controlled  - Hold Eliquis in the setting of suspecting UGIB  Elevated troponin  Demand Ischemia  Trop 62 likely demand ischemia in the setting of hypovolemia - trend trops  - check initial EKG   Anemia, Macrocytic  Hx of IDA - Hgb 10.9, MCV 101.9  - Follow up Ferritin and iron studies    OSA  - CPAP at bedtime   Hx Seizure  - No dispense history of Keppra per chart review.   Best Practice (right click and "Reselect all SmartList Selections" daily)   Diet/type: NPO DVT prophylaxis SCD Pressure ulcer(s): Elevated by RN GI prophylaxis: PPI Lines: PICC  Foley:  N/A Code Status:  full code Last date of multidisciplinary goals of care discussion [--]  Labs   CBC: No results for input(s): "WBC", "NEUTROABS", "HGB", "HCT", "MCV", "PLT" in the last 168 hours.  Basic Metabolic Panel: No results for input(s): "NA", "K", "CL", "CO2", "GLUCOSE", "BUN", "CREATININE", "CALCIUM", "MG", "PHOS" in the last 168 hours. GFR: CrCl cannot be calculated (Patient's most recent lab result is older than the maximum 21 days allowed.). No results for  input(s): "PROCALCITON", "WBC", "LATICACIDVEN" in the last 168 hours.  Liver Function Tests: No results for input(s): "AST", "ALT", "ALKPHOS", "BILITOT", "PROT", "ALBUMIN" in the last 168 hours. No results for input(s): "LIPASE", "AMYLASE" in the last 168 hours. No results for input(s): "AMMONIA" in the last 168 hours.  ABG    Component Value Date/Time   HCO3 21.7 09/20/2023 0532   ACIDBASEDEF 2.5 (H) 09/20/2023 0532   O2SAT 61.6 09/20/2023 0532     Coagulation Profile: No results for input(s): "INR", "PROTIME" in the last 168 hours.  Cardiac Enzymes: No results for input(s): "CKTOTAL", "CKMB", "CKMBINDEX", "TROPONINI" in the last 168 hours.  HbA1C: HB A1C (BAYER DCA - WAIVED)  Date/Time Value Ref Range Status  09/13/2023 11:08 AM 4.6 (L) 4.8 - 5.6 % Final    Comment:             Prediabetes: 5.7 - 6.4          Diabetes: >6.4          Glycemic control for adults with diabetes: <7.0   04/26/2023 10:47 AM 4.2 (L)  4.8 - 5.6 % Final    Comment:             Prediabetes: 5.7 - 6.4          Diabetes: >6.4          Glycemic control for adults with diabetes: <7.0    Hgb A1c MFr Bld  Date/Time Value Ref Range Status  09/19/2023 06:48 PM 4.4 (L) 4.8 - 5.6 % Final    Comment:    (NOTE) Pre diabetes:          5.7%-6.4%  Diabetes:              >6.4%  Glycemic control for   <7.0% adults with diabetes     CBG: Recent Labs  Lab 10/24/23 1701  GLUCAP 469*    Review of Systems:   As per HPI   Past Medical History:  He,  has a past medical history of Aortic atherosclerosis (HCC), Aortic stenosis, Arthritis, Asthma, BPH (benign prostatic hypertrophy), Cirrhosis (HCC) (2016), Colon polyps, Diabetes mellitus without complication (HCC), Diverticulosis, Dysrhythmia (04/19/2021), Gastric ulcer, Gastric varices, Gastritis, GERD (gastroesophageal reflux disease), Headache, Heart murmur, History of kidney stones, adenomatous colonic polyps, Hypertension, Iron deficiency anemia due to  chronic blood loss (08/16/2023), Portal hypertensive gastropathy (HCC), Renal cyst, right, Seizures (HCC), Sleep apnea, Testicle trouble, and Tubular adenoma of colon.   Surgical History:   Past Surgical History:  Procedure Laterality Date   BACK SURGERY     89  LOWER    CARPAL TUNNEL RELEASE Right 10/13/2015   Procedure: RIGHT CARPAL TUNNEL RELEASE;  Surgeon: Cindee Salt, MD;  Location: [facility];  Service: Orthopedics;  Laterality: Right;   CARPAL TUNNEL RELEASE Left 07/05/2016   Procedure: LEFT CARPAL TUNNEL RELEASE;  Surgeon: Cindee Salt, MD;  Location: [facility];  Service: Orthopedics;  Laterality: Left;   COLONOSCOPY WITH ESOPHAGOGASTRODUODENOSCOPY (EGD)  10/05/2022   DRUG INDUCED ENDOSCOPY N/A 07/02/2021   Procedure: DRUG INDUCED SLEEP ENDOSCOPY;  Surgeon: Christia Reading, MD;  Location: [facility] OR;  Service: ENT;  Laterality: N/A;   ESOPHAGOGASTRODUODENOSCOPY (EGD) WITH PROPOFOL N/A 09/25/2023   Procedure: ESOPHAGOGASTRODUODENOSCOPY (EGD) WITH PROPOFOL;  Surgeon: Sherrilyn Rist, MD;  Location: [facility];  Service: Gastroenterology;  Laterality: N/A;  EGD needs to be coordinated with TEE.  Patient has severe aortic stenosis, procedures need to be done to follow each other with 1 sedation   IMPLANTATION OF HYPOGLOSSAL NERVE STIMULATOR Right 10/01/2021   Procedure: IMPLANTATION OF HYPOGLOSSAL NERVE STIMULATOR;  Surgeon: Christia Reading, MD;  Location: [facility] OR;  Service: ENT;  Laterality: Right;   INGUINAL HERNIA REPAIR  2003   right    KNEE ARTHROSCOPY Right 03/03/2016   LUMBAR DISC SURGERY  03/1995   Dr. Daleen Squibb, discectomy   LUMBAR LAMINECTOMY/DECOMPRESSION MICRODISCECTOMY Right 10/06/2016   Procedure: RIGHT LUMBAR THREE - LUMBAR FOUR  LAMINECTOMY, FORAMINOTOMY AND MICRODISCECTOMY;  Surgeon: Shirlean Kelly, MD;  Location: [facility] OR;  Service: Neurosurgery;  Laterality: Right;   SHOULDER SURGERY  11/28/2005   left partial   SHOULDER SURGERY  07/14/2006    RIGHT   TEE WITHOUT CARDIOVERSION N/A 09/25/2023   Procedure: TRANSESOPHAGEAL ECHOCARDIOGRAM (TEE);  Surgeon: Dolores Patty, MD;  Location: [facility] ENDOSCOPY;  Service: Cardiovascular;  Laterality: N/A;   TONSILLECTOMY  AGE 50 OR 4   TOTAL KNEE ARTHROPLASTY Right 03/07/2022   Procedure: RIGHT TOTAL KNEE ARTHROPLASTY;  Surgeon: Gean Birchwood, MD;  Location: [facility];  Service: Orthopedics;  Laterality: Right;  TOTAL KNEE ARTHROPLASTY Left 06/06/2022   Procedure: LEFT TOTAL KNEE ARTHROPLASTY;  Surgeon: Gean Birchwood, MD;  Location: [facility];  Service: Orthopedics;  Laterality: Left;   TOTAL SHOULDER ARTHROPLASTY Right 11/01/2018   Procedure: RIGHT SHOULDER REVISION TO REVERSE TOTAL SHOULDER;  Surgeon: Jones Broom, MD;  Location: [facility];  Service: Orthopedics;  Laterality: Right;  CHOICE ANESTHESIA WITH INTERSCALENE BLOCK EXPAREL, NEEDS RNFA     Social History:   reports that he quit smoking about 20 years ago. His smoking use included cigarettes and pipe. He started smoking about 54 years ago. He has never used smokeless tobacco. He reports that he does not currently use alcohol. He reports that he does not use drugs.   Family History:  His family history includes Allergic rhinitis in his sister; Alzheimer's disease in his mother; Asthma in his father; Breast cancer in his maternal grandmother; Diabetes in his mother; Heart attack in his maternal grandmother and mother; Heart failure in his maternal grandmother and mother; Rheum arthritis in his maternal grandmother; Suicidality in his father. There is no history of Colon cancer, Esophageal cancer, Pancreatic cancer, or Liver disease.   Allergies Allergies  Allergen Reactions   Dimetapp Children's Cold-Cough Other (See Comments)    Chest discomfort    Erythromycin Diarrhea   Sulfonamide Derivatives Diarrhea     Home Medications  Prior to Admission medications   Medication Sig Start Date End Date Taking? Authorizing Provider  acetaminophen  (TYLENOL) 500 MG tablet Take 1,000 mg by mouth every 6 (six) hours as needed for moderate pain.    [provider]  apixaban (ELIQUIS) 5 MG TABS tablet Take 1 tablet (5 mg total) by mouth 2 (two) times daily. 06/08/23   [name], DO  atorvastatin (LIPITOR) 40 MG tablet Take 1 tablet (40 mg total) by mouth daily. 06/08/23   [name], DO  b complex vitamins tablet Take 1 tablet by mouth daily.    [provider]  Blood Glucose Monitoring Suppl (CONTOUR NEXT ONE) KIT Test BS QID and as needed Dx E11.9 06/04/20   Delynn Flavin M, DO  cefTRIAXone (ROCEPHIN) IVPB Inject 2 g into the vein daily. Indication:  10/18/23 First Dose: Yes Last Day of Therapy:  10/18/23 Labs - Once weekly:  CBC/D and BMP, Labs - Once weekly: ESR and CRP 09/27/23   Lewie Chamber, MD  Cholecalciferol (DIALYVITE VITAMIN D 5000) 125 MCG (5000 UT) capsule Take 5,000 Units by mouth daily.    [provider]  Chromium Picolinate (CHROMIUM PICOLATE PO) Take 1 tablet by mouth daily.    [provider]  CINNAMON PO Take 1,080 mg by mouth daily.    [provider]  Coenzyme Q10 (COQ10) 100 MG CAPS Take 100 mg by mouth daily.    [provider]  Continuous Blood Gluc Sensor (FREESTYLE LIBRE SENSOR SYSTEM) MISC Check BS eight (8) times a day. Dx E10.9 06/29/22   Delynn Flavin M, DO  diltiazem (CARDIZEM) 30 MG tablet Take 1 tablet (30 mg total) by mouth 2 (two) times daily. Patient must schedule annual office visit for further refills 06/26/23   Rollene Rotunda, MD  ferrous sulfate 325 (65 FE) MG EC tablet Take 1 tablet (325 mg total) by mouth daily with breakfast. 01/18/23   Rojelio Brenner M, PA-C  furosemide (LASIX) 20 MG tablet Take 1.5 tablets (30 mg total) by mouth daily. 06/08/23   [name], DO  Glucagon, rDNA, (GLUCAGON EMERGENCY) 1 MG KIT INJECT AS  DIRECTED INTO  UPPER ARM, THIGH OR  BUTTOCKS AS NEEDED FOR  SEVERE HYPOGLYCEMIA. SEEK   MEDICAL ATTENTION AFTER USE Patient taking differently: Inject 1 mg into the muscle as needed (HYPOGLYCEMIA). 06/14/21   Gwenlyn Fudge, FNP  Glucosamine-Chondroit-Vit C-Mn (GLUCOSAMINE 1500 COMPLEX) CAPS Take 2 capsules by mouth daily.    [provider]  glucose blood (CONTOUR NEXT TEST) test strip Test BS QID and as needed Dx E11.9 11/08/21   Delynn Flavin M, DO  insulin aspart (NOVOLOG) 100 UNIT/ML injection Inject 2-15 Units into the skin 4 (four) times daily -  before meals and at bedtime. CBG 121 - 150: 2 units, CBG 151 - 200: 3 units, CBG 201 - 250: 5 units, CBG 251 - 300: 8 units, CBG 301 - 350: 11 units, CBG 351 - 400: 15 units, CBG > 400call MD 09/27/23   Lewie Chamber, MD  insulin aspart (NOVOLOG) 100 UNIT/ML injection Inject 8 Units into the skin 3 (three) times daily with meals. 09/27/23   Lewie Chamber, MD  insulin glargine, 2 Unit Dial, (TOUJEO MAX SOLOSTAR) 300 UNIT/ML Solostar Pen Inject 20 Units into the skin daily. 09/27/23   Lewie Chamber, MD  Magnesium 250 MG TABS Take 250 mg by mouth daily.    [provider]  metoprolol succinate (TOPROL-XL) 25 MG 24 hr tablet Take 1 tablet (25 mg total) by mouth daily. 09/28/23   Lewie Chamber, MD  Microlet Lancets MISC Test BS QID and as needed Dx E11.9 06/04/20   Delynn Flavin M, DO  montelukast (SINGULAIR) 10 MG tablet Take 1 tablet (10 mg total) by mouth at bedtime. 06/08/23   [name], DO  Multiple Vitamins-Minerals (EYE HEALTH) CAPS Take 1 capsule by mouth daily.    [provider]  pantoprazole (PROTONIX) 40 MG tablet TAKE 1 TABLET BY MOUTH TWICE  DAILY BEFORE MEALS 10/16/23   Pyrtle, Carie Caddy, MD  potassium chloride SA (KLOR-CON M) 20 MEQ tablet Take 1 tablet (20 mEq total) by mouth daily. 06/08/23   [name], DO  ramelteon (ROZEREM) 8 MG tablet Take 1 tablet (8 mg total) by mouth at bedtime. 06/08/23   [name], DO  Testosterone 30 MG/ACT SOLN Apply 1 pump under each  axilla once daily. 07/21/23   [name], DO  Turmeric Curcumin 500 MG CAPS Take 500 mg by mouth daily.    [provider]  valsartan (DIOVAN) 40 MG tablet Take 1 tablet (40 mg total) by mouth daily. 06/08/23   [name], DO     Critical care time:

## 2023-10-24 NOTE — Progress Notes (Signed)
 Pt. Has his own personal cpap. Able to operate himself.

## 2023-10-24 NOTE — Progress Notes (Signed)
 Consent obtained via telephone from next of kin Maverick Dieudonne for PICC.

## 2023-10-25 DIAGNOSIS — K5641 Fecal impaction: Secondary | ICD-10-CM | POA: Diagnosis not present

## 2023-10-25 DIAGNOSIS — E1110 Type 2 diabetes mellitus with ketoacidosis without coma: Principal | ICD-10-CM

## 2023-10-25 DIAGNOSIS — G9340 Encephalopathy, unspecified: Secondary | ICD-10-CM | POA: Diagnosis not present

## 2023-10-25 DIAGNOSIS — K7460 Unspecified cirrhosis of liver: Secondary | ICD-10-CM

## 2023-10-25 DIAGNOSIS — K921 Melena: Secondary | ICD-10-CM

## 2023-10-25 DIAGNOSIS — R579 Shock, unspecified: Secondary | ICD-10-CM

## 2023-10-25 DIAGNOSIS — E43 Unspecified severe protein-calorie malnutrition: Secondary | ICD-10-CM | POA: Insufficient documentation

## 2023-10-25 HISTORY — DX: Type 2 diabetes mellitus with ketoacidosis without coma: E11.10

## 2023-10-25 LAB — AMMONIA: Ammonia: 32 umol/L (ref 9–35)

## 2023-10-25 LAB — GLUCOSE, CAPILLARY
Glucose-Capillary: 436 mg/dL — ABNORMAL HIGH (ref 70–99)
Glucose-Capillary: 198 mg/dL — ABNORMAL HIGH (ref 70–99)
Glucose-Capillary: 164 mg/dL — ABNORMAL HIGH (ref 70–99)
Glucose-Capillary: 173 mg/dL — ABNORMAL HIGH (ref 70–99)
Glucose-Capillary: 169 mg/dL — ABNORMAL HIGH (ref 70–99)
Glucose-Capillary: 163 mg/dL — ABNORMAL HIGH (ref 70–99)
Glucose-Capillary: 157 mg/dL — ABNORMAL HIGH (ref 70–99)
Glucose-Capillary: 146 mg/dL — ABNORMAL HIGH (ref 70–99)
Glucose-Capillary: 161 mg/dL — ABNORMAL HIGH (ref 70–99)
Glucose-Capillary: 485 mg/dL — ABNORMAL HIGH (ref 70–99)
Glucose-Capillary: 128 mg/dL — ABNORMAL HIGH (ref 70–99)
Glucose-Capillary: 143 mg/dL — ABNORMAL HIGH (ref 70–99)
Glucose-Capillary: 198 mg/dL — ABNORMAL HIGH (ref 70–99)

## 2023-10-25 LAB — BASIC METABOLIC PANEL
Anion gap: 10 (ref 5–15)
Anion gap: 5 (ref 5–15)
BUN: 22 mg/dL (ref 8–23)
BUN: 18 mg/dL (ref 8–23)
CO2: 24 mmol/L (ref 22–32)
CO2: 25 mmol/L (ref 22–32)
Calcium: 9.8 mg/dL (ref 8.9–10.3)
Calcium: 9.5 mg/dL (ref 8.9–10.3)
Chloride: 106 mmol/L (ref 98–111)
Chloride: 108 mmol/L (ref 98–111)
Creatinine, Ser: 1.38 mg/dL — ABNORMAL HIGH (ref 0.61–1.24)
Creatinine, Ser: 1.14 mg/dL (ref 0.61–1.24)
GFR, Estimated: 54 mL/min — ABNORMAL LOW (ref >60)
GFR, Estimated: >60 mL/min (ref >60)
Glucose, Bld: 168 mg/dL — ABNORMAL HIGH (ref 70–99)
Glucose, Bld: 168 mg/dL — ABNORMAL HIGH (ref 70–99)
Potassium: 3.5 mmol/L (ref 3.5–5.1)
Potassium: 3.2 mmol/L — ABNORMAL LOW (ref 3.5–5.1)
Sodium: 140 mmol/L (ref 135–145)
Sodium: 138 mmol/L (ref 135–145)

## 2023-10-25 LAB — CBC
HCT: 29.4 % — ABNORMAL LOW (ref 39.0–52.0)
Hemoglobin: 10.1 g/dL — ABNORMAL LOW (ref 13.0–17.0)
MCH: 35.4 pg — ABNORMAL HIGH (ref 26.0–34.0)
MCHC: 34.4 g/dL (ref 30.0–36.0)
MCV: 103.2 fL — ABNORMAL HIGH (ref 80.0–100.0)
Platelets: 131 10*3/uL — ABNORMAL LOW (ref 150–400)
RBC: 2.85 MIL/uL — ABNORMAL LOW (ref 4.22–5.81)
RDW: 17 % — ABNORMAL HIGH (ref 11.5–15.5)
WBC: 6.9 10*3/uL (ref 4.0–10.5)
nRBC: 0 % (ref 0.0–0.2)

## 2023-10-25 LAB — HEMOGLOBIN AND HEMATOCRIT, BLOOD
HCT: 27.9 % — ABNORMAL LOW (ref 39.0–52.0)
HCT: 23.7 % — ABNORMAL LOW (ref 39.0–52.0)
Hemoglobin: 9.4 g/dL — ABNORMAL LOW (ref 13.0–17.0)
Hemoglobin: 7.9 g/dL — ABNORMAL LOW (ref 13.0–17.0)

## 2023-10-25 LAB — PHOSPHORUS: Phosphorus: 2.1 mg/dL — ABNORMAL LOW (ref 2.5–4.6)

## 2023-10-25 LAB — BETA-HYDROXYBUTYRIC ACID
Beta-Hydroxybutyric Acid: 0.66 mmol/L — ABNORMAL HIGH (ref 0.05–0.27)
Beta-Hydroxybutyric Acid: 0.14 mmol/L (ref 0.05–0.27)

## 2023-10-25 LAB — LACTIC ACID, PLASMA
Lactic Acid, Venous: 3.5 mmol/L (ref 0.5–1.9)
Lactic Acid, Venous: 3.6 mmol/L (ref 0.5–1.9)

## 2023-10-25 LAB — PROCALCITONIN: Procalcitonin: 0.18 ng/mL

## 2023-10-25 LAB — TROPONIN I (HIGH SENSITIVITY)
Troponin I (High Sensitivity): 61 ng/L — ABNORMAL HIGH (ref <18)
Troponin I (High Sensitivity): 64 ng/L — ABNORMAL HIGH (ref <18)

## 2023-10-25 LAB — MAGNESIUM: Magnesium: 1.1 mg/dL — ABNORMAL LOW (ref 1.7–2.4)

## 2023-10-25 MED ORDER — MAGNESIUM SULFATE 4 GM/100ML IV SOLN
4.0000 g | Freq: Once | INTRAVENOUS | Status: AC
  Administered 2023-10-25: 4 g via INTRAVENOUS
  Filled 2023-10-25: qty 100

## 2023-10-25 MED ORDER — INSULIN ASPART 100 UNIT/ML IJ SOLN: 0.0000 [IU] | Freq: Three times a day (TID) | INTRAMUSCULAR | Status: DC

## 2023-10-25 MED ORDER — INSULIN GLARGINE 100 UNITS/ML SOLOSTAR PEN
11.0000 [IU] | PEN_INJECTOR | SUBCUTANEOUS | Status: DC
  Filled 2023-10-25: qty 3

## 2023-10-25 MED ORDER — ENSURE ENLIVE PO LIQD
237.0000 mL | Freq: Three times a day (TID) | ORAL | Status: DC
  Administered 2023-10-26: 237 mL via ORAL

## 2023-10-25 MED ORDER — POTASSIUM CHLORIDE 10 MEQ/50ML IV SOLN
10.0000 meq | INTRAVENOUS | Status: AC
  Administered 2023-10-25 (×4): 10 meq via INTRAVENOUS
  Filled 2023-10-25 (×4): qty 50

## 2023-10-25 MED ORDER — ADULT MULTIVITAMIN W/MINERALS CH
1.0000 | ORAL_TABLET | Freq: Every day | ORAL | Status: DC
  Administered 2023-10-25 – 2023-11-03 (×10): 1 via ORAL
  Filled 2023-10-25 (×10): qty 1

## 2023-10-25 MED ORDER — INSULIN GLARGINE 100 UNIT/ML ~~LOC~~ SOLN
20.0000 [IU] | Freq: Every day | SUBCUTANEOUS | Status: DC
  Administered 2023-10-26: 20 [IU] via SUBCUTANEOUS
  Filled 2023-10-25: qty 0.2

## 2023-10-25 MED ORDER — LACTATED RINGERS IV BOLUS
500.0000 mL | Freq: Once | INTRAVENOUS | Status: AC
  Administered 2023-10-25: 500 mL via INTRAVENOUS

## 2023-10-25 MED ORDER — INSULIN GLARGINE 100 UNIT/ML ~~LOC~~ SOLN
11.0000 [IU] | Freq: Every day | SUBCUTANEOUS | Status: DC
  Administered 2023-10-25: 11 [IU] via SUBCUTANEOUS
  Filled 2023-10-25: qty 0.11

## 2023-10-25 MED ORDER — INSULIN GLARGINE-YFGN 100 UNIT/ML ~~LOC~~ SOLN
20.0000 [IU] | SUBCUTANEOUS | Status: DC
Start: 2023-10-25 — End: 2023-10-25
  Filled 2023-10-25: qty 0.2

## 2023-10-25 MED ORDER — INSULIN ASPART 100 UNIT/ML IJ SOLN
0.0000 [IU] | Freq: Three times a day (TID) | INTRAMUSCULAR | Status: DC
  Administered 2023-10-25: 2 [IU] via SUBCUTANEOUS
  Administered 2023-10-26: 11 [IU] via SUBCUTANEOUS

## 2023-10-25 MED ORDER — INSULIN GLARGINE 100 UNIT/ML ~~LOC~~ SOLN
10.0000 [IU] | Freq: Every day | SUBCUTANEOUS | Status: AC
  Administered 2023-10-25: 10 [IU] via SUBCUTANEOUS
  Filled 2023-10-25: qty 0.1

## 2023-10-25 MED ORDER — SODIUM CHLORIDE 0.9 % IV SOLN: INTRAVENOUS | Status: AC | PRN

## 2023-10-25 MED ORDER — LACTATED RINGERS IV BOLUS
1000.0000 mL | Freq: Once | INTRAVENOUS | Status: AC
  Administered 2023-10-25: 1000 mL via INTRAVENOUS

## 2023-10-25 NOTE — TOC Initial Note (Signed)
 Transition of Care [facility]) - Initial/Assessment Note    Patient Details  Name: Tony Cantu MRN: [id number] Date of Birth: 11-16-1950  Transition of Care [facility]) CM/SW Contact:    Marliss Coots, LCSW Phone Number: 10/25/2023, 1:16 PM  Clinical Narrative:                  1:16 PM Per chart review, patient was transferred from [facility] but has SNF history [facility] earlier this year). Patient is on oxygen via nasal cannula and disoriented x2. CSW attempted to call patient's relative, [name], for further information, but there was no response and a voicemail was left.    Barriers to Discharge: Continued Medical Work up   Patient Goals and CMS Choice            Expected Discharge Plan and Services       Living arrangements for the past 2 months: Single Family Home, Skilled Nursing Facility                                      Prior Living Arrangements/Services Living arrangements for the past 2 months: Single Family Home, Skilled Nursing Facility Lives with:: Facility Resident, Self Patient language and need for interpreter reviewed:: Yes        Need for Family Participation in Patient Care: Yes (Comment) Care giver support system in place?: Yes (comment)   Criminal Activity/Legal Involvement Pertinent to Current Situation/Hospitalization: No - Comment as needed  Activities of Daily Living      Permission Sought/Granted   Permission granted to share information with : No (Contact information on chart)  Share Information with NAME: Christofer Shen     Permission granted to share info w Relationship: Relative  Permission granted to share info w Contact Information: [phone]  Emotional Assessment Appearance:: Appears stated age Attitude/Demeanor/Rapport: Unable to Assess Affect (typically observed): Unable to Assess Orientation: : Oriented to Self, Oriented to Place Alcohol / Substance Use: Not Applicable Psych Involvement: No  (comment)  Admission diagnosis:  Gastrointestinal bleed [K92.2] GI bleed [K92.2] Patient Active Problem List   Diagnosis Date Noted   GI bleed 10/24/2023   Hemorrhagic shock (HCC) 10/24/2023   Other cirrhosis of liver (HCC) 09/25/2023   Abnormal finding on GI tract imaging 09/25/2023   Esophageal candidiasis (HCC) 09/25/2023   Bacteremia 09/21/2023   Sepsis (HCC) 09/20/2023   Stroke-like symptom 09/20/2023   Acute encephalopathy 09/20/2023   Transaminitis 09/20/2023   Renal lesion 09/20/2023   Melena 09/20/2023   Iron deficiency anemia due to chronic blood loss 08/16/2023   NAFLD (nonalcoholic fatty liver disease) 56/38/7564   Portal hypertension (HCC) 08/24/2022   S/P total knee arthroplasty, left 06/06/2022   Degenerative arthritis of left knee 06/03/2022   Chronic obstructive pulmonary disease (HCC) 05/10/2022   Grade I diastolic dysfunction 03/11/2022   Postoperative fever 03/10/2022   S/P TKR (total knee replacement), right 03/07/2022   Osteoarthritis of right knee 03/04/2022   Precordial chest pain 04/20/2021   Former smoker 01/12/2021   PAF (paroxysmal atrial fibrillation) (HCC) 06/22/2020   Hypertension associated with type 2 diabetes mellitus (HCC) 05/17/2019   Hyperlipidemia associated with type 2 diabetes mellitus (HCC) 05/17/2019   GERD with esophagitis 05/17/2019   H/O total shoulder replacement, right 11/01/2018   Paroxysmal tachycardia (HCC) 01/11/2017   Nonrheumatic aortic valve stenosis 01/11/2017   Aortic atherosclerosis (HCC) 11/23/2016   HNP (herniated  nucleus pulposus), lumbar 10/06/2016   Acute renal injury (HCC) 08/06/2016   Diarrhea 08/06/2016   Fever 08/06/2016   Hyperbilirubinemia 08/06/2016   Lactic acidosis 08/06/2016   Inguinal hernia 12/10/2015   Right groin pain 11/30/2015   Carpal tunnel syndrome on right 09/09/2015   Cervical spondylosis without myelopathy 09/09/2015   Thrombocytopenia (HCC) 07/21/2014   Bilateral carotid bruits  05/11/2014   Vitamin D deficiency 09/17/2013   BPH (benign prostatic hyperplasia) 05/22/2013   Low serum testosterone level 02/18/2013   Diabetes type 2, controlled (HCC) 01/10/2013   OSA (obstructive sleep apnea) 05/16/2012   Edema 02/21/2012   At risk for coronary artery disease 03/20/2011   Obesity 03/20/2011   Allergic rhinitis 06/22/2010   ASTHMA 06/22/2010   COUGH 06/22/2010   PCP:  [name], DO Pharmacy:   [facility] [place], [place] - [address] [place] [address] Phone: [phone] Fax: [phone]  [facility] Delivery - [place], [place] - [address] [place]  [address] Phone: [phone] Fax: [phone]  [facility] - [place], [place] - [address] [employer] [address] [place] [address] Phone: [phone] Fax: [phone]     Social Drivers of Health (SDOH) Social History: SDOH Screenings   Food Insecurity: Patient Unable To Answer (09/20/2023)  Housing: Low Risk  (09/22/2023)  Transportation Needs: Patient Unable To Answer (09/20/2023)  Utilities: Patient Unable To Answer (09/20/2023)  Depression (PHQ2-9): Medium Risk (09/13/2023)  Financial Resource Strain: Low Risk  (06/09/2022)  Social Connections: Patient Unable To Answer (09/20/2023)  Tobacco Use: Medium Risk (10/23/2023)   Received from [facility]   SDOH Interventions:     Readmission Risk Interventions     No data to display

## 2023-10-25 NOTE — Progress Notes (Signed)
 [facility] ADULT ICU REPLACEMENT PROTOCOL   The patient does apply for the [facility] Adult ICU Electrolyte Replacment Protocol based on the criteria listed below:   1.Exclusion criteria: TCTS, ECMO, Dialysis, and Myasthenia Gravis patients 2. Is GFR >/= 30 ml/min? Yes.    Patient's GFR today is 54 3. Is SCr </= 2? Yes.   Patient's SCr is 1.38 mg/dL 4. Did SCr increase >/= 0.5 in 24 hours? No. 5.Pt's weight >40kg  Yes.   6. Abnormal electrolyte(s): potassium 3.5  7. Electrolytes replaced per protocol 8.  Call MD STAT for K+ </= 2.5, Phos </= 1, or Mag </= 1 Physician:  protocol  [name] 10/25/2023 3:13 AM

## 2023-10-25 NOTE — Progress Notes (Signed)
 Initial Nutrition Assessment  DOCUMENTATION CODES:   Severe malnutrition in context of chronic illness  INTERVENTION:   Ensure Enlive po TID, each supplement provides 350 kcal and 20 grams of protein, discussed with provider.  Encourage PO intake when awake and alert  MVI with minerals daily  Monitor magnesium and phosphorus daily until normalized, MD to replete as needed, as pt is at risk for refeeding syndrome given hx of severe malnutrition.   NUTRITION DIAGNOSIS:   Severe Malnutrition related to chronic illness (cirrhosis) as evidenced by severe fat depletion, severe muscle depletion.  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 Pt now admitted from SNF for DKA, AKI, and possible GI Bleed.   Pt sleeping during my visit, did not wake during exam.  Noted pt advanced to clears.   2/25 - admitted with DKA/AKI 2/26 - agitated over night on precedex, GI consult no GI bleed    Medications reviewed and include: SSI every 4 hours, 20 units lantus daily, protonix 4 g Mag sulfate x 1 Levophed @ 7 mcg  IV 10 mEq KCl x 4   Labs reviewed:  K 3.2 Mag 1.1 CBG's: 128-485   NUTRITION - FOCUSED PHYSICAL EXAM:  Flowsheet Row Most Recent Value  Orbital Region Severe depletion  Upper Arm Region Moderate depletion  Thoracic and Lumbar Region Mild depletion  Buccal Region Severe depletion  Temple Region Severe depletion  Clavicle Bone Region Severe depletion  Clavicle and Acromion Bone Region Severe depletion  Scapular Bone Region  Severe depletion  Dorsal Hand Moderate depletion  Patellar Region Severe depletion  Anterior Thigh Region Severe depletion  Posterior Calf Region Severe depletion  Edema (RD Assessment) None  Hair Reviewed  Eyes Unable to assess  Mouth Unable to assess  Skin Reviewed  [dry]  Nails Reviewed       Diet Order:   Diet Order             Diet clear liquid Room service appropriate? Yes; Fluid consistency: Thin  Diet effective now                   EDUCATION NEEDS:   Not appropriate for education at this time  Skin:  Skin Assessment: Reviewed RN Assessment  Last BM:  2/25  Height:   Ht Readings from Last 1 Encounters:  09/20/23 5\' 8"  (1.727 m)    Weight:   Wt Readings from Last 1 Encounters:  10/24/23 81.3 kg    BMI:  Body mass index is 27.25 kg/m.  Estimated Nutritional Needs:   Kcal:  2000-2200  Protein:  120-140 grams  Fluid:  >1.9 L/day  Cammy Copa., RD, LDN, CNSC See AMiON for contact information

## 2023-10-25 NOTE — Progress Notes (Signed)
 NAME:  Tony Cantu, MRN:  [id number], DOB:  06/20/1951, LOS: 1 ADMISSION DATE:  10/24/2023, CONSULTATION DATE:  10/23/2023 REFERRING MD:  Gwenyth Bouillon, CHIEF COMPLAINT:  syncope    History of Present Illness:  This is a 73 y.o. male with hx of type 1 diabetes, A-fib on Eliquis, cirrhosis, seizure disorder, aortic stenosis, hypertension, peptic ulcer disease, OA with bilateral knee replacement, polyclonal gammopathy, OSA on CPAP brought in by EMS from [facility] to [facility] [place] [facility]) on 2/24 for syncope. Per [facility] note: The ED provider had called [facility] rehab who notified that the patient had a syncopal episode that was not witnessed. Patient was found in his wheelchair, no fall. Patient was unresponsive for 10 to 15 minutes with low blood pressure.  He was brought to the ED with altered mental status and lethargic.  Initial vitals showed blood pressure of 67/41, bradycardia 55, afebrile.  Labs were notable for creatinine 2.27, troponin 67, lactate 6.8. BNP 639.  CT CAP showed findings of mild fecal impaction with possible sterile coral colitis.  Rectal exam notable for frank melena, no overt bleed with guaiac positive.  Patient remained hypotensive even after fluid resuscitation, he was started on Levophed drip.  EDP consulted general surgery who recommended no surgical evaluation at this time.  EDP also consulted GI at [facility] health who recommended holding blood thinners and plan to see him in the morning.  Patient is brought to [facility] today 2/25, evaluated at bedside. Poor historian but oriented to self and place. Reports about 2 weeks of generalized weakness and fatigue. Some lightheadedness but no dizziness. Endorse cough with notably blood. Endorses melanotic stools. Denies chest pain, SOB, abdominal pain, n/v. Poor appetite for past few weeks.  Of note: 01/29 recently discharged from [facility] for Sepsis Secondary Streptococcus Salivarius Bacteremia and esophogeal candidiasis with PICC line  to complete 4 weeks of IV ceftriaxone with end of treatment date 10/18/2023 and 3 weeks of p.o. Fluconazole.   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   Significant Hospital Events: Including procedures, antibiotic start and stop dates in addition to other pertinent events   2/25 transfer from [facility] for hematemesis c/f GI bleed and hypotensive needing levophed 2/26 started on precedex overnight due to agitation   Interim History / Subjective:  Awakens to voice. Oriented x3. No further episodes of bleeding.   Objective   Blood pressure 128/67, pulse (!) 43, temperature (!) 96.3 F (35.7 C), temperature source Axillary, resp. rate 14, weight 81.3 kg, SpO2 98%.        Intake/Output Summary (Last 24 hours) at 10/25/2023 0834 Last data filed at 10/25/2023 0800 Gross per 24 hour  Intake 3194.27 ml  Output 2050 ml  Net 1144.27 ml   Filed Weights   10/24/23 1709  Weight: 81.3 kg    Examination: General: awakens to voice, laying in bed, in no acute distress HENT: [place]/AT Lungs: normal effort on Jeff, clear bilaterally  Cardiovascular: bradycardia, systolic murmur at RUSB Abdomen: bowel sounds present, soft, non-tender, non-distended Extremities: no LE edema Neuro: A&O x3, moves all extremities   Resolved Hospital Problem list     Assessment & Plan:  Shock, likely hypovolemic and DKA Syncope Hx of hematemesis and melena c/f UGIB Hx of Esophageal candidiasis Hx  of Portal hypertensive gastropathy Hx of 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 He was discharged on 1/29 with PICC line to complete 4 weeks of IV ceftriaxone with EOT 10/18/2023 and 3 weeks of p.o. Fluconazole. He was supposed to follow up with ID, however per chart review, he did not. His current clinical findings are more likely consistent with hypovolemic shock 2/2 portal gastropathy and gastroesophageal varices leading to black tarry stools, syncope and generalized weakness.   - GI has signed off, no plans for endoscopy - IV Rocephin for SBP ppx  - IV PPI BID, can change to po - stopped octreotide   - Levophed for MAP > 65, wean as tolerated - trend lactic acid  - pending UA - restart CLD  DKA, resolved T1DM Elevated AG >15, BG 469. BHB elevated at admission. Started on Endotool. - AG closed x 2, BHB trended down - transition protocol to SQ insulin glargine and SSI  - restart diet    Anemia, Macrocytic  Hx of IDA - Iron studies unremarkable  - Hgb slight drop to 9.4 from 10.1, admit was 12.1 (BL 10-11) - trend CBC  Elevated troponin  Trop 37>>49>>61. EKG without acute ischemic changes. Suspect demand ischemia. Denies any chest pain.  - trend trops to peak   AKI  Cr 2.27 baseline 0.8-0.9, BUN 42.  - S/p IVF  - Scr improving to baseline  - Trend BMP - Avoid nephrotoxic agents  - Strict Is & Os  Hx of Streptococcus salivarius bacteremia  Recent hospitalization. Repeat Bcx 1/22 negative, was to complete IV abx via PICC with EOT 2/19. - [facility] collected Bcx x 2 on 2/24 - No fevers or leukocytosis on admission, remains afebrile - Repeat Bcx    Hx of Afib on Eliquis Severe aortic stenosis  - Holding home Dilt 30 mg BID and Toprol XL 25 mg daily  - Remains NSR and rate controlled  - Hold Eliquis due to c/f GIB, may likely resume if CBC stable   OSA  Hypoglossal Nerve Stimulator - Has R nerve stimulator in place, no signs of overlying infection of area - CPAP at  bedtime   Hx Seizure  - No dispense history of Keppra per chart review.   Best Practice (right click and "Reselect all SmartList Selections" daily)   Diet/type: clear liquids and NPO DVT prophylaxis SCD Pressure ulcer(s): Elevated by RN GI prophylaxis: PPI Lines: PICC  Foley:  N/A Code Status:  full code Last date of multidisciplinary goals of care discussion [emergency contact updated ]  Labs   CBC: Recent Labs  Lab 10/24/23 1744 10/24/23 2053  WBC 8.6 8.3  HGB 12.1* 12.5*  HCT 36.0* 37.1*  MCV 104.0* 107.2*  PLT 162 167    Basic Metabolic Panel: Recent Labs  Lab 10/24/23 1744 10/24/23 1800 10/24/23 2053 10/25/23 0226 10/25/23 0524  NA 135 134* 136 140 138  K 4.6 4.6 5.0 3.5 3.2*  CL 98 98 98 106 108  CO2 14* 15* 14* 24 25  GLUCOSE 478* 467* 470* 168* 168*  BUN 30* 31* 30* 22 18  CREATININE 2.07* 2.09* 1.83* 1.38* 1.14  CALCIUM 10.1 10.0 10.3 9.8 9.5   GFR: Estimated Creatinine Clearance: 55 mL/min (by C-G formula based on SCr of 1.14  mg/dL). Recent Labs  Lab 10/24/23 1744 10/24/23 2053  WBC 8.6 8.3  LATICACIDVEN 3.8* 3.9*    Liver Function Tests: Recent Labs  Lab 10/24/23 1744  AST 42*  ALT 40  ALKPHOS 123  BILITOT 1.5*  PROT 6.8  ALBUMIN 2.5*   No results for input(s): "LIPASE", "AMYLASE" in the last 168 hours. No results for input(s): "AMMONIA" in the last 168 hours.  ABG    Component Value Date/Time   HCO3 21.7 09/20/2023 0532   ACIDBASEDEF 2.5 (H) 09/20/2023 0532   O2SAT 61.6 09/20/2023 0532     Coagulation Profile: Recent Labs  Lab 10/24/23 2053  INR 1.2    Cardiac Enzymes: No results for input(s): "CKTOTAL", "CKMB", "CKMBINDEX", "TROPONINI" in the last 168 hours.  HbA1C: HB A1C (BAYER DCA - WAIVED)  Date/Time Value Ref Range Status  09/13/2023 11:08 AM 4.6 (L) 4.8 - 5.6 % Final    Comment:             Prediabetes: 5.7 - 6.4          Diabetes: >6.4          Glycemic control for adults with diabetes: <7.0    04/26/2023 10:47 AM 4.2 (L) 4.8 - 5.6 % Final    Comment:             Prediabetes: 5.7 - 6.4          Diabetes: >6.4          Glycemic control for adults with diabetes: <7.0    Hgb A1c MFr Bld  Date/Time Value Ref Range Status  09/19/2023 06:48 PM 4.4 (L) 4.8 - 5.6 % Final    Comment:    (NOTE) Pre diabetes:          5.7%-6.4%  Diabetes:              >6.4%  Glycemic control for   <7.0% adults with diabetes     CBG: Recent Labs  Lab 10/25/23 0501 10/25/23 0610 10/25/23 0746 10/25/23 0800 10/25/23 0805  GLUCAP 163* 157* 146* 485* 161*    Review of Systems:   As per HPI   Past Medical History:  He,  has a past medical history of Aortic atherosclerosis (HCC), Aortic stenosis, Arthritis, Asthma, BPH (benign prostatic hypertrophy), Cirrhosis (HCC) (2016), Colon polyps, Diabetes mellitus without complication (HCC), Diverticulosis, Dysrhythmia (04/19/2021), Gastric ulcer, Gastric varices, Gastritis, GERD (gastroesophageal reflux disease), Headache, Heart murmur, History of kidney stones, adenomatous colonic polyps, Hypertension, Iron deficiency anemia due to chronic blood loss (08/16/2023), Portal hypertensive gastropathy (HCC), Renal cyst, right, Seizures (HCC), Sleep apnea, Testicle trouble, and Tubular adenoma of colon.   Surgical History:   Past Surgical History:  Procedure Laterality Date   BACK SURGERY     89  LOWER    CARPAL TUNNEL RELEASE Right 10/13/2015   Procedure: RIGHT CARPAL TUNNEL RELEASE;  Surgeon: Cindee Salt, MD;  Location: [facility];  Service: Orthopedics;  Laterality: Right;   CARPAL TUNNEL RELEASE Left 07/05/2016   Procedure: LEFT CARPAL TUNNEL RELEASE;  Surgeon: Cindee Salt, MD;  Location: [facility];  Service: Orthopedics;  Laterality: Left;   COLONOSCOPY WITH ESOPHAGOGASTRODUODENOSCOPY (EGD)  10/05/2022   DRUG INDUCED ENDOSCOPY N/A 07/02/2021   Procedure: DRUG INDUCED SLEEP ENDOSCOPY;  Surgeon: Christia Reading, MD;   Location: [facility] OR;  Service: ENT;  Laterality: N/A;   ESOPHAGOGASTRODUODENOSCOPY (EGD) WITH PROPOFOL N/A 09/25/2023   Procedure: ESOPHAGOGASTRODUODENOSCOPY (EGD) WITH PROPOFOL;  Surgeon: Charlie Pitter III,  MD;  Location: [facility];  Service: Gastroenterology;  Laterality: N/A;  EGD needs to be coordinated with TEE.  Patient has severe aortic stenosis, procedures need to be done to follow each other with 1 sedation   IMPLANTATION OF HYPOGLOSSAL NERVE STIMULATOR Right 10/01/2021   Procedure: IMPLANTATION OF HYPOGLOSSAL NERVE STIMULATOR;  Surgeon: Christia Reading, MD;  Location: [facility] OR;  Service: ENT;  Laterality: Right;   INGUINAL HERNIA REPAIR  2003   right    KNEE ARTHROSCOPY Right 03/03/2016   LUMBAR DISC SURGERY  03/1995   Dr. Daleen Squibb, discectomy   LUMBAR LAMINECTOMY/DECOMPRESSION MICRODISCECTOMY Right 10/06/2016   Procedure: RIGHT LUMBAR THREE - LUMBAR FOUR  LAMINECTOMY, FORAMINOTOMY AND MICRODISCECTOMY;  Surgeon: Shirlean Kelly, MD;  Location: [facility] OR;  Service: Neurosurgery;  Laterality: Right;   SHOULDER SURGERY  11/28/2005   left partial   SHOULDER SURGERY  07/14/2006   RIGHT   TEE WITHOUT CARDIOVERSION N/A 09/25/2023   Procedure: TRANSESOPHAGEAL ECHOCARDIOGRAM (TEE);  Surgeon: Dolores Patty, MD;  Location: [facility];  Service: Cardiovascular;  Laterality: N/A;   TONSILLECTOMY  AGE 19 OR 4   TOTAL KNEE ARTHROPLASTY Right 03/07/2022   Procedure: RIGHT TOTAL KNEE ARTHROPLASTY;  Surgeon: Gean Birchwood, MD;  Location: [facility];  Service: Orthopedics;  Laterality: Right;   TOTAL KNEE ARTHROPLASTY Left 06/06/2022   Procedure: LEFT TOTAL KNEE ARTHROPLASTY;  Surgeon: Gean Birchwood, MD;  Location: [facility];  Service: Orthopedics;  Laterality: Left;   TOTAL SHOULDER ARTHROPLASTY Right 11/01/2018   Procedure: RIGHT SHOULDER REVISION TO REVERSE TOTAL SHOULDER;  Surgeon: Jones Broom, MD;  Location: [facility];  Service: Orthopedics;  Laterality: Right;  CHOICE ANESTHESIA WITH INTERSCALENE BLOCK  EXPAREL, NEEDS RNFA     Social History:   reports that he quit smoking about 20 years ago. His smoking use included cigarettes and pipe. He started smoking about 54 years ago. He has never used smokeless tobacco. He reports that he does not currently use alcohol. He reports that he does not use drugs.   Family History:  His family history includes Allergic rhinitis in his sister; Alzheimer's disease in his mother; Asthma in his father; Breast cancer in his maternal grandmother; Diabetes in his mother; Heart attack in his maternal grandmother and mother; Heart failure in his maternal grandmother and mother; Rheum arthritis in his maternal grandmother; Suicidality in his father. There is no history of Colon cancer, Esophageal cancer, Pancreatic cancer, or Liver disease.   Allergies Allergies  Allergen Reactions   Dimetapp Children's Cold-Cough Other (See Comments)    Chest discomfort    Erythromycin Diarrhea   Sulfonamide Derivatives Diarrhea     Home Medications  Prior to Admission medications   Medication Sig Start Date End Date Taking? Authorizing Provider  acetaminophen (TYLENOL) 500 MG tablet Take 1,000 mg by mouth every 6 (six) hours as needed for moderate pain.    [provider]  apixaban (ELIQUIS) 5 MG TABS tablet Take 1 tablet (5 mg total) by mouth 2 (two) times daily. 06/08/23   [name], DO  atorvastatin (LIPITOR) 40 MG tablet Take 1 tablet (40 mg total) by mouth daily. 06/08/23   [name], DO  b complex vitamins tablet Take 1 tablet by mouth daily.    [provider]  Blood Glucose Monitoring Suppl (CONTOUR NEXT ONE) KIT Test BS QID and as needed Dx E11.9 06/04/20   Delynn Flavin M, DO  cefTRIAXone (ROCEPHIN) IVPB Inject 2 g into the vein daily. Indication:  10/18/23 First Dose:  Yes Last Day of Therapy:  10/18/23 Labs - Once weekly:  CBC/D and BMP, Labs - Once weekly: ESR and CRP 09/27/23   Lewie Chamber, MD  Cholecalciferol  (DIALYVITE VITAMIN D 5000) 125 MCG (5000 UT) capsule Take 5,000 Units by mouth daily.    [provider]  Chromium Picolinate (CHROMIUM PICOLATE PO) Take 1 tablet by mouth daily.    [provider]  CINNAMON PO Take 1,080 mg by mouth daily.    [provider]  Coenzyme Q10 (COQ10) 100 MG CAPS Take 100 mg by mouth daily.    [provider]  Continuous Blood Gluc Sensor (FREESTYLE LIBRE SENSOR SYSTEM) MISC Check BS eight (8) times a day. Dx E10.9 06/29/22   Delynn Flavin M, DO  diltiazem (CARDIZEM) 30 MG tablet Take 1 tablet (30 mg total) by mouth 2 (two) times daily. Patient must schedule annual office visit for further refills 06/26/23   Rollene Rotunda, MD  ferrous sulfate 325 (65 FE) MG EC tablet Take 1 tablet (325 mg total) by mouth daily with breakfast. 01/18/23   Rojelio Brenner M, PA-C  furosemide (LASIX) 20 MG tablet Take 1.5 tablets (30 mg total) by mouth daily. 06/08/23   [name], DO  Glucagon, rDNA, (GLUCAGON EMERGENCY) 1 MG KIT INJECT AS DIRECTED INTO  UPPER ARM, THIGH OR  BUTTOCKS AS NEEDED FOR  SEVERE HYPOGLYCEMIA. SEEK  MEDICAL ATTENTION AFTER USE Patient taking differently: Inject 1 mg into the muscle as needed (HYPOGLYCEMIA). 06/14/21   Gwenlyn Fudge, FNP  Glucosamine-Chondroit-Vit C-Mn (GLUCOSAMINE 1500 COMPLEX) CAPS Take 2 capsules by mouth daily.    [provider]  glucose blood (CONTOUR NEXT TEST) test strip Test BS QID and as needed Dx E11.9 11/08/21   Delynn Flavin M, DO  insulin aspart (NOVOLOG) 100 UNIT/ML injection Inject 2-15 Units into the skin 4 (four) times daily -  before meals and at bedtime. CBG 121 - 150: 2 units, CBG 151 - 200: 3 units, CBG 201 - 250: 5 units, CBG 251 - 300: 8 units, CBG 301 - 350: 11 units, CBG 351 - 400: 15 units, CBG > 400call MD 09/27/23   Lewie Chamber, MD  insulin aspart (NOVOLOG) 100 UNIT/ML injection Inject 8 Units into the skin 3 (three) times daily with meals. 09/27/23    Lewie Chamber, MD  insulin glargine, 2 Unit Dial, (TOUJEO MAX SOLOSTAR) 300 UNIT/ML Solostar Pen Inject 20 Units into the skin daily. 09/27/23   Lewie Chamber, MD  Magnesium 250 MG TABS Take 250 mg by mouth daily.    [provider]  metoprolol succinate (TOPROL-XL) 25 MG 24 hr tablet Take 1 tablet (25 mg total) by mouth daily. 09/28/23   Lewie Chamber, MD  Microlet Lancets MISC Test BS QID and as needed Dx E11.9 06/04/20   Delynn Flavin M, DO  montelukast (SINGULAIR) 10 MG tablet Take 1 tablet (10 mg total) by mouth at bedtime. 06/08/23   [name], DO  Multiple Vitamins-Minerals (EYE HEALTH) CAPS Take 1 capsule by mouth daily.    [provider]  pantoprazole (PROTONIX) 40 MG tablet TAKE 1 TABLET BY MOUTH TWICE  DAILY BEFORE MEALS 10/16/23   Pyrtle, Carie Caddy, MD  potassium chloride SA (KLOR-CON M) 20 MEQ tablet Take 1 tablet (20 mEq total) by mouth daily. 06/08/23   [name], DO  ramelteon (ROZEREM) 8 MG tablet Take 1 tablet (8 mg total) by mouth at bedtime. 06/08/23   [name], DO  Testosterone  30 MG/ACT SOLN Apply 1 pump under each axilla once daily. 07/21/23   [name], DO  Turmeric Curcumin 500 MG CAPS Take 500 mg by mouth daily.    [provider]  valsartan (DIOVAN) 40 MG tablet Take 1 tablet (40 mg total) by mouth daily. 06/08/23   [name], DO     Critical care time:

## 2023-10-25 NOTE — Consult Note (Addendum)
 Consultation  Referring Provider: CCM/Desai Primary Care Physician:  [name], DO Primary Gastroenterologist:  Dr. Rhea Belton  Reason for Consultation: Hematemesis, reported GI bleeding in setting of known cirrhosis  HPI: Tony Cantu is a 73 y.o. male, known to the GI service with multiple significant comorbidities.  He has history of insulin-dependent diabetes mellitus, hypertension, moderate to severe aortic stenosis (with very recent workup during his last hospitalization in January with TEE showing EF of 60 to 65% and moderate to severe aortic stenosis no evidence of endocarditis.) Patient is to follow-up with cardiology to discuss TAVR versus possible watchman. 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 During his last admission he had a strep bacteremia, and underwent workup to rule out endocarditis.  EGD was requested prior to TEE   EGD during his last admission in January which showed diffuse esophageal plaques felt consistent with candidiasis, no esophageal varices,+ evidence of portal gastropathy and GAVE ( non bleeding) with few scattered nonbleeding AVMs as well.  Patient was discharged with PICC line in place to complete a 4-week course of IV antibiotics for the bacteremia which was completed on 10/18/2023.  He has been in a nursing facility.  Yesterday he presented to [facility] ER after syncopal episode at the nursing home.  Per the notes he was found unresponsive in his wheelchair and remained unresponsive for 10 to 15 minutes with low blood pressure.  Per notes from [facility] ER there was no initial report of hematemesis He was found to be in AKI and also hypotensive requiring pressors, with lactic acidosis. Sepsis protocol was initiated  Imaging there with CT of the head negative for acute process CT of the chest abdomen and pelvis showed mild fecal impaction with possible stercoral colitis, On rectal exam he was noted to have melenic  appearing stool.  Decision was made to transfer him here.  He remains on pressors, had also been initiated on octreotide, and has been undergoing treatment for DKA There has not been any evidence of active GI bleeding since his admission, no bowel movements or melena, no nausea vomiting or hematemesis, and hemoglobin relatively stable.  He was restless and agitated last p.m. and started on Precedex. Currently sedated and not able to participate in conversation this morning  Labs yesterday on presentation to [facility] with hemoglobin 11.6/hematocrit 33.3/platelets 149/WBC 8.9 Pro time 15.5/INR 1.4 BNP 639 BUN 42/creatinine 2.27  Last p.m.-WBC 8.6/hemoglobin 12.1/hematocrit 36.0/platelets 162 Iron studies not consistent with iron deficiency BUN 31/creatinine 2.09 Glucose 492 Lactate 3.9 Hemoglobin 12.5/hematocrit 37.1  Today-WBC 6.9/hemoglobin 10.1/hematocrit 29.4/platelets 131 Potassium 3.2/BUN 18/creatinine 1.1/glucose 168  Patient's hemoglobin was in the 9.5-10 range at the time of discharge on 09/28/2023  Colonoscopy February 2024 with removal of 4 polyps which were tubular adenomas and hyperplastic polyps, noted to have multiple diverticuli and internal hemorrhoids.   Past Medical History:  Diagnosis Date   Aortic atherosclerosis (HCC)    Aortic stenosis    Arthritis    Asthma    BPH (benign prostatic hypertrophy)    Cirrhosis (HCC) 2016   Colon polyps    Diabetes mellitus without complication (HCC)    TYPE 1    Diverticulosis    Dysrhythmia 04/19/2021   A-fib noted with possible A-fib RVR   Gastric ulcer    Gastric varices    right   Gastritis    GERD (gastroesophageal reflux disease)    Headache    History- pt states these were migraines  that occurred in the 1980's   Heart murmur    History of kidney stones    Hx of adenomatous colonic polyps    Hypertension    Iron deficiency anemia due to chronic blood loss 08/16/2023   Portal hypertensive  gastropathy (HCC)    Renal cyst, right    Seizures (HCC)    HYPOGLYCEMIC LAST 1 AND 1/2 YRS AGO   Sleep apnea    uses CPAP nightly   Testicle trouble    one testicle BORN WITH   Tubular adenoma of colon     Past Surgical History:  Procedure Laterality Date   BACK SURGERY     89  LOWER    CARPAL TUNNEL RELEASE Right 10/13/2015   Procedure: RIGHT CARPAL TUNNEL RELEASE;  Surgeon: Cindee Salt, MD;  Location: [facility];  Service: Orthopedics;  Laterality: Right;   CARPAL TUNNEL RELEASE Left 07/05/2016   Procedure: LEFT CARPAL TUNNEL RELEASE;  Surgeon: Cindee Salt, MD;  Location: [facility];  Service: Orthopedics;  Laterality: Left;   COLONOSCOPY WITH ESOPHAGOGASTRODUODENOSCOPY (EGD)  10/05/2022   DRUG INDUCED ENDOSCOPY N/A 07/02/2021   Procedure: DRUG INDUCED SLEEP ENDOSCOPY;  Surgeon: Christia Reading, MD;  Location: [facility] OR;  Service: ENT;  Laterality: N/A;   ESOPHAGOGASTRODUODENOSCOPY (EGD) WITH PROPOFOL N/A 09/25/2023   Procedure: ESOPHAGOGASTRODUODENOSCOPY (EGD) WITH PROPOFOL;  Surgeon: Sherrilyn Rist, MD;  Location[facility];  Service: Gastroenterology;  Laterality: N/A;  EGD needs to be coordinated with TEE.  Patient has severe aortic stenosis, procedures need to be done to follow each other with 1 sedation   IMPLANTATION OF HYPOGLOSSAL NERVE STIMULATOR Right 10/01/2021   Procedure: IMPLANTATION OF HYPOGLOSSAL NERVE STIMULATOR;  Surgeon: Christia Reading, MD;  Location: [facility] OR;  Service: ENT;  Laterality: Right;   INGUINAL HERNIA REPAIR  2003   right    KNEE ARTHROSCOPY Right 03/03/2016   LUMBAR DISC SURGERY  03/1995   Dr. Daleen Squibb, discectomy   LUMBAR LAMINECTOMY/DECOMPRESSION MICRODISCECTOMY Right 10/06/2016   Procedure: RIGHT LUMBAR THREE - LUMBAR FOUR  LAMINECTOMY, FORAMINOTOMY AND MICRODISCECTOMY;  Surgeon: Shirlean Kelly, MD;  Location: [facility] OR;  Service: Neurosurgery;  Laterality: Right;   SHOULDER SURGERY  11/28/2005   left partial   SHOULDER  SURGERY  07/14/2006   RIGHT   TEE WITHOUT CARDIOVERSION N/A 09/25/2023   Procedure: TRANSESOPHAGEAL ECHOCARDIOGRAM (TEE);  Surgeon: Dolores Patty, MD;  Location: [facility] ENDOSCOPY;  Service: Cardiovascular;  Laterality: N/A;   TONSILLECTOMY  AGE 73 OR 4   TOTAL KNEE ARTHROPLASTY Right 03/07/2022   Procedure: RIGHT TOTAL KNEE ARTHROPLASTY;  Surgeon: Gean Birchwood, MD;  Location: [facility] ORS;  Service: Orthopedics;  Laterality: Right;   TOTAL KNEE ARTHROPLASTY Left 06/06/2022   Procedure: LEFT TOTAL KNEE ARTHROPLASTY;  Surgeon: Gean Birchwood, MD;  Location: [facility] ORS;  Service: Orthopedics;  Laterality: Left;   TOTAL SHOULDER ARTHROPLASTY Right 11/01/2018   Procedure: RIGHT SHOULDER REVISION TO REVERSE TOTAL SHOULDER;  Surgeon: Jones Broom, MD;  Location: [facility] ORS;  Service: Orthopedics;  Laterality: Right;  CHOICE ANESTHESIA WITH INTERSCALENE BLOCK EXPAREL, NEEDS RNFA    Prior to Admission medications   Medication Sig Start Date End Date Taking? Authorizing Provider  atorvastatin (LIPITOR) 40 MG tablet Take 1 tablet (40 mg total) by mouth daily. 06/08/23  Yes Gottschalk, Ashly M, DO  fluconazole (DIFLUCAN) 200 MG tablet Take 400 mg by mouth daily.   Yes [provider]  furosemide (LASIX) 20 MG tablet Take 1.5 tablets (30 mg total) by mouth  daily. 06/08/23  Yes Delynn Flavin M, DO  acetaminophen (TYLENOL) 500 MG tablet Take 1,000 mg by mouth every 6 (six) hours as needed for moderate pain.    [provider]  apixaban (ELIQUIS) 5 MG TABS tablet Take 1 tablet (5 mg total) by mouth 2 (two) times daily. 06/08/23   [name], DO  b complex vitamins tablet Take 1 tablet by mouth daily.    [provider]  Blood Glucose Monitoring Suppl (CONTOUR NEXT ONE) KIT Test BS QID and as needed Dx E11.9 06/04/20   Delynn Flavin M, DO  cefTRIAXone (ROCEPHIN) IVPB Inject 2 g into the vein daily. Indication:  10/18/23 First Dose: Yes Last Day of Therapy:  10/18/23 Labs - Once  weekly:  CBC/D and BMP, Labs - Once weekly: ESR and CRP 09/27/23   Lewie Chamber, MD  Cholecalciferol (DIALYVITE VITAMIN D 5000) 125 MCG (5000 UT) capsule Take 5,000 Units by mouth daily.    [provider]  Chromium Picolinate (CHROMIUM PICOLATE PO) Take 1 tablet by mouth daily.    [provider]  CINNAMON PO Take 1,080 mg by mouth daily.    [provider]  Coenzyme Q10 (COQ10) 100 MG CAPS Take 100 mg by mouth daily.    [provider]  Continuous Blood Gluc Sensor (FREESTYLE LIBRE SENSOR SYSTEM) MISC Check BS eight (8) times a day. Dx E10.9 06/29/22   Delynn Flavin M, DO  diltiazem (CARDIZEM) 30 MG tablet Take 1 tablet (30 mg total) by mouth 2 (two) times daily. Patient must schedule annual office visit for further refills 06/26/23   Rollene Rotunda, MD  ferrous sulfate 325 (65 FE) MG EC tablet Take 1 tablet (325 mg total) by mouth daily with breakfast. Patient not taking: Reported on 10/25/2023 01/18/23   Carnella Guadalajara, PA-C  Glucagon, rDNA, (GLUCAGON EMERGENCY) 1 MG KIT INJECT AS DIRECTED INTO  UPPER ARM, THIGH OR  BUTTOCKS AS NEEDED FOR  SEVERE HYPOGLYCEMIA. SEEK  MEDICAL ATTENTION AFTER USE Patient taking differently: Inject 1 mg into the muscle as needed (HYPOGLYCEMIA). 06/14/21   Gwenlyn Fudge, FNP  Glucosamine-Chondroit-Vit C-Mn (GLUCOSAMINE 1500 COMPLEX) CAPS Take 2 capsules by mouth daily.    [provider]  glucose blood (CONTOUR NEXT TEST) test strip Test BS QID and as needed Dx E11.9 11/08/21   Delynn Flavin M, DO  insulin aspart (NOVOLOG) 100 UNIT/ML injection Inject 2-15 Units into the skin 4 (four) times daily -  before meals and at bedtime. CBG 121 - 150: 2 units, CBG 151 - 200: 3 units, CBG 201 - 250: 5 units, CBG 251 - 300: 8 units, CBG 301 - 350: 11 units, CBG 351 - 400: 15 units, CBG > 400call MD 09/27/23   Lewie Chamber, MD  insulin aspart (NOVOLOG) 100 UNIT/ML injection Inject 8 Units into the skin 3 (three) times  daily with meals. 09/27/23   Lewie Chamber, MD  insulin glargine, 2 Unit Dial, (TOUJEO MAX SOLOSTAR) 300 UNIT/ML Solostar Pen Inject 20 Units into the skin daily. 09/27/23   Lewie Chamber, MD  Magnesium 250 MG TABS Take 250 mg by mouth daily.    [provider]  metoprolol succinate (TOPROL-XL) 25 MG 24 hr tablet Take 1 tablet (25 mg total) by mouth daily. 09/28/23   Lewie Chamber, MD  Microlet Lancets MISC Test BS QID and as needed Dx E11.9 06/04/20   Delynn Flavin M, DO  montelukast (SINGULAIR) 10 MG tablet Take 1 tablet (10 mg total) by  mouth at bedtime. 06/08/23   [name], DO  Multiple Vitamins-Minerals (EYE HEALTH) CAPS Take 1 capsule by mouth daily.    [provider]  pantoprazole (PROTONIX) 40 MG tablet TAKE 1 TABLET BY MOUTH TWICE  DAILY BEFORE MEALS 10/16/23   Pyrtle, Carie Caddy, MD  potassium chloride SA (KLOR-CON M) 20 MEQ tablet Take 1 tablet (20 mEq total) by mouth daily. 06/08/23   [name], DO  ramelteon (ROZEREM) 8 MG tablet Take 1 tablet (8 mg total) by mouth at bedtime. 06/08/23   [name], DO  Testosterone 30 MG/ACT SOLN Apply 1 pump under each axilla once daily. 07/21/23   [name], DO  Turmeric Curcumin 500 MG CAPS Take 500 mg by mouth daily.    [provider]  valsartan (DIOVAN) 40 MG tablet Take 1 tablet (40 mg total) by mouth daily. 06/08/23   [name], DO    Current Facility-Administered Medications  Medication Dose Route Frequency Provider Last Rate Last Admin   acetaminophen (TYLENOL) tablet 650 mg  650 mg Oral Q4H PRN Rana Snare, DO       atorvastatin (LIPITOR) tablet 40 mg  40 mg Oral Daily Rana Snare, DO       cefTRIAXone (ROCEPHIN) 2 g in sodium chloride 0.9 % 100 mL IVPB  2 g Intravenous Q24H Rana Snare, DO   Stopped at 10/24/23 1901   Chlorhexidine Gluconate Cloth 2 % PADS 6 each  6 each Topical Daily Charlott Holler, MD   6 each at 10/24/23 1705   dexmedetomidine  (PRECEDEX) 400 MCG/100ML (4 mcg/mL) infusion  0-1.2 mcg/kg/hr Intravenous Titrated Larinda Buttery, MD 12.2 mL/hr at 10/25/23 0900 0.6 mcg/kg/hr at 10/25/23 0900   dextrose 5 % in lactated ringers infusion   Intravenous Continuous Rana Snare, DO 125 mL/hr at 10/25/23 0900 Infusion Verify at 10/25/23 0900   dextrose 50 % solution 0-50 mL  0-50 mL Intravenous PRN Rana Snare, DO       docusate sodium (COLACE) capsule 100 mg  100 mg Oral BID PRN Rana Snare, DO       insulin regular, human (MYXREDLIN) 100 units/ 100 mL infusion   Intravenous Continuous Rana Snare, DO 2.2 mL/hr at 10/25/23 0900 2.2 Units/hr at 10/25/23 0900   lactated ringers infusion   Intravenous Continuous Rana Snare, DO   Stopped at 10/25/23 0238   montelukast (SINGULAIR) tablet 10 mg  10 mg Oral QHS Rana Snare, DO       norepinephrine (LEVOPHED) 4mg  in (0.016 mg/mL) premix infusion  0-40 mcg/min Intravenous Titrated Rana Snare, DO 45 mL/hr at 10/25/23 0900 12 mcg/min at 10/25/23 0900   octreotide (SANDOSTATIN) 500 mcg in sodium chloride 0.9 % 250 mL (2 mcg/mL) infusion  50 mcg/hr Intravenous Continuous Rana Snare, DO 25 mL/hr at 10/25/23 0900 50 mcg/hr at 10/25/23 0900   pantoprazole (PROTONIX) injection 40 mg  40 mg Intravenous BID Rana Snare, DO   40 mg at 10/24/23 2144   polyethylene glycol (MIRALAX / GLYCOLAX) packet 17 g  17 g Oral Daily PRN Rana Snare, DO       sodium chloride flush (NS) 0.9 % injection 10-40 mL  10-40 mL Intracatheter Q12H Charlott Holler, MD   10 mL at 10/24/23 2200   sodium chloride flush (NS) 0.9 % injection 10-40 mL  10-40 mL Intracatheter PRN Charlott Holler, MD       sodium chloride flush (NS) 0.9 % injection 10-40 mL  10-40  mL Intracatheter Q12H Charlott Holler, MD   10 mL at 10/24/23 2134   sodium chloride flush (NS) 0.9 % injection 10-40 mL  10-40 mL Intracatheter PRN Charlott Holler, MD        Allergies as of 10/24/2023 - Review Complete 09/25/2023   Allergen Reaction Noted   Dimetapp children's cold-cough Other (See Comments) 02/24/2022   Erythromycin Diarrhea    Sulfonamide derivatives Diarrhea 06/22/2010    Family History  Problem Relation Age of Onset   Heart failure Mother    Heart attack Mother        in her 28's   Alzheimer's disease Mother    Diabetes Mother    Asthma Father    Suicidality Father        in his 24's   Allergic rhinitis Sister    Heart failure Maternal Grandmother    Rheum arthritis Maternal Grandmother    Breast cancer Maternal Grandmother    Heart attack Maternal Grandmother        in her 77's   Colon cancer Neg Hx    Esophageal cancer Neg Hx    Pancreatic cancer Neg Hx    Liver disease Neg Hx     Social History   Socioeconomic History   Marital status: Married    Spouse name: Not on file   Number of children: 1   Years of education: Not on file   Highest education level: Not on file  Occupational History   Occupation: retired   Tobacco Use   Smoking status: Former    Current packs/day: 0.00    Types: Cigarettes, Pipe    Start date: 1971    Quit date: 2005    Years since quitting: 20.1   Smokeless tobacco: Never   Tobacco comments:    quit 2005 smoked cigarettes for 5 yrs prior to pipe use  Vaping Use   Vaping status: Never Used  Substance and Sexual Activity   Alcohol use: Not Currently   Drug use: No   Sexual activity: Yes  Other Topics Concern   Not on file  Social History Narrative   ** Merged History Encounter **    Right handed    Social Drivers of Health   Financial Resource Strain: Low Risk  (06/09/2022)   Overall Financial Resource Strain (CARDIA)    Difficulty of Paying Living Expenses: Not hard at all  Food Insecurity: Patient Unable To Answer (09/20/2023)   Hunger Vital Sign    Worried About Running Out of Food in the Last Year: Patient unable to answer    Ran Out of Food in the Last Year: Patient unable to answer  Transportation Needs: Patient Unable To  Answer (09/20/2023)   PRAPARE - Transportation    Lack of Transportation (Medical): Patient unable to answer    Lack of Transportation (Non-Medical): Patient unable to answer  Physical Activity: Not on file  Stress: Not on file  Social Connections: Patient Unable To Answer (09/20/2023)   Social Connection and Isolation Panel [NHANES]    Frequency of Communication with Friends and Family: Patient unable to answer    Frequency of Social Gatherings with Friends and Family: Patient unable to answer    Attends Religious Services: Patient unable to answer    Active Member of Clubs or Organizations: Patient unable to answer    Attends Banker Meetings: Patient unable to answer    Marital Status: Patient unable to answer  Intimate Partner Violence: Patient Unable To Answer (  09/20/2023)   Humiliation, Afraid, Rape, and Kick questionnaire    Fear of Current or Ex-Partner: Patient unable to answer    Emotionally Abused: Patient unable to answer    Physically Abused: Patient unable to answer    Sexually Abused: Patient unable to answer    Review of Systems: Pertinent positive and negative review of systems were noted in the above HPI section.  All other review of systems was otherwise negative.   Physical Exam: Vital signs in last 24 hours: Temp:  [96.3 F (35.7 C)-99.8 F (37.7 C)] 96.3 F (35.7 C) (02/26 0751) Pulse Rate:  [39-113] 49 (02/26 0900) Resp:  [16-26] 16 (02/26 0900) BP: (63-150)/(39-120) 127/76 (02/26 0900) SpO2:  [87 %-98 %] 97 % (02/26 0900) Weight:  [81.3 kg] 81.3 kg (02/25 1709) Last BM Date : 10/24/23 General:   Alert,  Well-developed, elderly white male, sedated, on O2 2 L Head:  Normocephalic and atraumatic. Eyes:  Sclera clear, no icterus.   Conjunctiva pink. Ears:  Normal auditory acuity. Nose:  No deformity, discharge,  or lesions. Mouth:  No deformity or lesions.   Neck:  Supple; no masses or thyromegaly. Lungs:   Clear throughout to auscultation.    No wheezes, crackles, or rhonchi. Heart:  Regular rate and rhythm; no murmurs, clicks, rubs,  or gallops. Abdomen:  Soft,nontender, BS active,nonpalp mass or hsm.   Rectal: Not done, documented dark/melenic heme positive stool at [facility] Msk:  Symmetrical without gross deformities. . Pulses:  Normal pulses noted. Extremities:  Without clubbing or edema. Neurologic: Sedated Skin:  Intact without significant lesions or rashes.. Psych: Sedated.  Intake/Output from previous day: 02/25 0701 - 02/26 0700 In: 2649.3 [I.V.:2292.4; IV Piggyback:356.9] Out: 1650 [Urine:1650] Intake/Output this shift: Total I/O In: 799.3 [I.V.:660.7; IV Piggyback:138.6] Out: 400 [Urine:400]  Lab Results: Recent Labs    10/24/23 1744 10/24/23 2053 10/25/23 0659  WBC 8.6 8.3 6.9  HGB 12.1* 12.5* 10.1*  HCT 36.0* 37.1* 29.4*  PLT 162 167 131*   BMET Recent Labs    10/24/23 2053 10/25/23 0226 10/25/23 0524  NA 136 140 138  K 5.0 3.5 3.2*  CL 98 106 108  CO2 14* 24 25  GLUCOSE 470* 168* 168*  BUN 30* 22 18  CREATININE 1.83* 1.38* 1.14  CALCIUM 10.3 9.8 9.5   LFT Recent Labs    10/24/23 1744  PROT 6.8  ALBUMIN 2.5*  AST 42*  ALT 40  ALKPHOS 123  BILITOT 1.5*   PT/INR Recent Labs    10/24/23 2053  LABPROT 15.5*  INR 1.2   Hepatitis Panel No results for input(s): "HEPBSAG", "HCVAB", "HEPAIGM", "HEPBIGM" in the last 72 hours.   IMPRESSION:  #64 73 year old white male, current nursing home resident, who was sent to the [facility] ER yesterday from his nursing facility after a syncopal episode, with associated hypotension.  Per the notes was apparently unresponsive for about 15 minutes at the nursing facility. Found to have a significant lactic acidosis and severe hyperglycemia well as acute kidney injury Sepsis protocol was initiated there. Though there was no report of any active GI bleeding he was noted to have very dark melenic appearing stool on rectal exam.  He  required pressor support.  Due to concerns for possible active GI bleeding decision was made to transfer to [facility].  He has not manifested any evidence of active GI bleeding.  He has had a drift in hemoglobin since presentation but this is actually back to his baseline,  and was likely hemoconcentrated.  He has been found to be in DKA and that has been treated.  Still not entirely clear what precipitated this event, as he had recently been hospitalized with strep bacteremia, needs further sepsis/bacteremic workup  #2 moderate to severe aortic stenosis #3 atrial fibrillation-on Eliquis #4 known decompensated cirrhosis/presumed EtOH related-with very recent EGD showing esophageal varices, nonbleeding, portal gastropathy, nonbleeding gastric AVMs and evidence of GAVE.  (January 2025)  #5 seizure disorder #6.  History of polyclonal #7.  Sleep apnea with CPAP use # 8 fecal impaction per imaging at [facility]: Will not plan endoscopic evaluation at this time, will continue to monitor for evidence of active GI bleeding Continue IV PPI twice daily Okay to stop octreotide Hold Eliquis Serial hemoglobins and transfuse as indicated Check ammonia Enema, and disimpaction Okay for clear to full liquid diet once alert GI will follow with you     Amy EsterwoodPA-C  10/25/2023, 9:25 AM  I have taken an interval history, thoroughly reviewed the chart and examined the patient. I agree with the Advanced Practitioner's note, impression and recommendations, and have recorded additional findings, impressions and recommendations below. I performed a substantive portion of this encounter (>50% time spent), including a complete performance of the medical decision making.  My additional thoughts are as follows:   73 year old man known to our practice for cirrhosis, known to me from most recent admission and upper endoscopy.  To clarify, this patient's recent upper endoscopy did NOT show  esophageal or gastric varices.  (I have changed the APP's original note to reflect that). Transferred here from outside hospital where he presented with shock that was believed to be hemorrhagic from GI bleeding.  He reportedly had melena, though none has been witnessed or recorded since transfer here.  In addition, his hemoglobin is very similar to what it was at the time of recent hospital discharge after now having been volume resuscitated.  Overall clinical picture suggest that his clinical presentation of shock were from DKA and hypovolemia rather than hemorrhage. Please consider that this patient was also recently bacteremic and on a 4-week course of IV antibiotics by PICC line (although no vegetations were noted on the recent TEE).  So if blood cultures were not done at the outside hospital, please be certain that they are drawn here.  He had some small upper GI AVMs that presumably could have caused some GI bleeding (if the initial reports of bleeding from the outside hospital were accurate), but even if that is so there is no current evidence of overt GI bleeding since transfer here, He had no varices on upper GI endoscopy, so we are discontinuing his octreotide.  No current plans for endoscopy.  Continue IV pantoprazole for GI bleeding prophylaxis, can decrease it to once daily at your discretion.  Contact us if this patient appears to be having overt GI bleeding and we will return to see him.  Otherwise signing off, thanks for involving [place] in his care.  Complex and critically ill patient requiring extensive chart review and medical decision making.  Charlie Pitter III Office:[phone]

## 2023-10-25 NOTE — Inpatient Diabetes Management (Signed)
 Inpatient Diabetes Program Recommendations  AACE/ADA: New Consensus Statement on Inpatient Glycemic Control (2015)  Target Ranges:  Prepandial:   less than 140 mg/dL      Peak postprandial:   less than 180 mg/dL (1-2 hours)      Critically ill patients:  140 - 180 mg/dL   Lab Results  Component Value Date   GLUCAP 161 (H) 10/25/2023   HGBA1C 4.4 (L) 09/19/2023    Latest Reference Range & Units 10/25/23 05:24  Sodium 135 - 145 mmol/L 138  Potassium 3.5 - 5.1 mmol/L 3.2 (L)  Chloride 98 - 111 mmol/L 108  CO2 22 - 32 mmol/L 25  Glucose 70 - 99 mg/dL 324 (H)  BUN 8 - 23 mg/dL 18  Creatinine 4.01 - 0.27 mg/dL 2.53  Calcium 8.9 - 66.4 mg/dL 9.5  Anion gap 5 - 15  5  (L): Data is abnormally low (H): Data is abnormally high  Diabetes history: DM1 Outpatient Diabetes medications: Toujeo 20 units daily, Novolog 8 units tid meal coverage, Novolog 2-15 units qid correction Current orders for Inpatient glycemic control: IV insulin  Inpatient Diabetes Program Recommendations:   When ready to transition to subcutaneous insulin, please consider: -Semglee 16 units daily (give 2 hrs.prior to D/C of insulin drip) -Novolog 0-6 units q 4 hrs. (Give first dose when IV insulin discontinued)  Thank you, Billy Fischer. Oddie Kuhlmann, RN, MSN, CDCES  Diabetes Coordinator Inpatient Glycemic Control Team Team Pager [phone] (8am-5pm) 10/25/2023 8:54 AM

## 2023-10-26 ENCOUNTER — Ambulatory Visit: Payer: BC Managed Care – PPO | Admitting: Nurse Practitioner

## 2023-10-26 DIAGNOSIS — E1010 Type 1 diabetes mellitus with ketoacidosis without coma: Secondary | ICD-10-CM | POA: Diagnosis not present

## 2023-10-26 DIAGNOSIS — K922 Gastrointestinal hemorrhage, unspecified: Secondary | ICD-10-CM | POA: Diagnosis not present

## 2023-10-26 DIAGNOSIS — R579 Shock, unspecified: Secondary | ICD-10-CM | POA: Diagnosis not present

## 2023-10-26 DIAGNOSIS — I4891 Unspecified atrial fibrillation: Secondary | ICD-10-CM | POA: Diagnosis not present

## 2023-10-26 LAB — GLUCOSE, CAPILLARY
Glucose-Capillary: 196 mg/dL — ABNORMAL HIGH (ref 70–99)
Glucose-Capillary: 243 mg/dL — ABNORMAL HIGH (ref 70–99)
Glucose-Capillary: 247 mg/dL — ABNORMAL HIGH (ref 70–99)
Glucose-Capillary: 306 mg/dL — ABNORMAL HIGH (ref 70–99)
Glucose-Capillary: 316 mg/dL — ABNORMAL HIGH (ref 70–99)
Glucose-Capillary: 341 mg/dL — ABNORMAL HIGH (ref 70–99)

## 2023-10-26 LAB — LACTIC ACID, PLASMA
Lactic Acid, Venous: 2.8 mmol/L (ref 0.5–1.9)
Lactic Acid, Venous: 3.2 mmol/L (ref 0.5–1.9)
Lactic Acid, Venous: 2.6 mmol/L (ref 0.5–1.9)
Lactic Acid, Venous: 1.9 mmol/L (ref 0.5–1.9)

## 2023-10-26 LAB — BASIC METABOLIC PANEL
Anion gap: 10 (ref 5–15)
Anion gap: 9 (ref 5–15)
BUN: 11 mg/dL (ref 8–23)
BUN: 9 mg/dL (ref 8–23)
CO2: 24 mmol/L (ref 22–32)
CO2: 26 mmol/L (ref 22–32)
Calcium: 8.7 mg/dL — ABNORMAL LOW (ref 8.9–10.3)
Calcium: 8.2 mg/dL — ABNORMAL LOW (ref 8.9–10.3)
Chloride: 103 mmol/L (ref 98–111)
Chloride: 98 mmol/L (ref 98–111)
Creatinine, Ser: 0.92 mg/dL (ref 0.61–1.24)
Creatinine, Ser: 0.88 mg/dL (ref 0.61–1.24)
GFR, Estimated: >60 mL/min (ref >60)
GFR, Estimated: >60 mL/min (ref >60)
Glucose, Bld: 229 mg/dL — ABNORMAL HIGH (ref 70–99)
Glucose, Bld: 186 mg/dL — ABNORMAL HIGH (ref 70–99)
Potassium: 3.8 mmol/L (ref 3.5–5.1)
Potassium: 3.2 mmol/L — ABNORMAL LOW (ref 3.5–5.1)
Sodium: 137 mmol/L (ref 135–145)
Sodium: 133 mmol/L — ABNORMAL LOW (ref 135–145)

## 2023-10-26 LAB — MAGNESIUM: Magnesium: 1.6 mg/dL — ABNORMAL LOW (ref 1.7–2.4)

## 2023-10-26 LAB — HEMOGLOBIN AND HEMATOCRIT, BLOOD
HCT: 31.8 % — ABNORMAL LOW (ref 39.0–52.0)
Hemoglobin: 10.7 g/dL — ABNORMAL LOW (ref 13.0–17.0)

## 2023-10-26 LAB — PHOSPHORUS: Phosphorus: 1.8 mg/dL — ABNORMAL LOW (ref 2.5–4.6)

## 2023-10-26 MED ORDER — INSULIN ASPART 100 UNIT/ML IJ SOLN
0.0000 [IU] | Freq: Every day | INTRAMUSCULAR | Status: DC
  Administered 2023-10-27: 3 [IU] via SUBCUTANEOUS
  Administered 2023-10-28: 4 [IU] via SUBCUTANEOUS

## 2023-10-26 MED ORDER — GLUCERNA SHAKE PO LIQD
237.0000 mL | Freq: Three times a day (TID) | ORAL | Status: DC
  Administered 2023-10-26 – 2023-11-03 (×23): 237 mL via ORAL
  Filled 2023-10-26: qty 237

## 2023-10-26 MED ORDER — AMIODARONE HCL IN DEXTROSE 360-4.14 MG/200ML-% IV SOLN
60.0000 mg/h | INTRAVENOUS | Status: AC
  Administered 2023-10-26 (×2): 60 mg/h via INTRAVENOUS
  Filled 2023-10-26: qty 200

## 2023-10-26 MED ORDER — INSULIN ASPART 100 UNIT/ML IJ SOLN
4.0000 [IU] | Freq: Three times a day (TID) | INTRAMUSCULAR | Status: DC
  Administered 2023-10-26 – 2023-10-28 (×5): 4 [IU] via SUBCUTANEOUS

## 2023-10-26 MED ORDER — POTASSIUM PHOSPHATES 15 MMOLE/5ML IV SOLN
30.0000 mmol | Freq: Once | INTRAVENOUS | Status: AC
  Administered 2023-10-26: 30 mmol via INTRAVENOUS
  Filled 2023-10-26: qty 10

## 2023-10-26 MED ORDER — AMIODARONE LOAD VIA INFUSION
150.0000 mg | Freq: Once | INTRAVENOUS | Status: AC
  Administered 2023-10-26: 150 mg via INTRAVENOUS
  Filled 2023-10-26: qty 83.34

## 2023-10-26 MED ORDER — INSULIN GLARGINE 100 UNIT/ML ~~LOC~~ SOLN
25.0000 [IU] | Freq: Every day | SUBCUTANEOUS | Status: DC
  Administered 2023-10-27: 25 [IU] via SUBCUTANEOUS
  Filled 2023-10-26: qty 0.25

## 2023-10-26 MED ORDER — INSULIN ASPART 100 UNIT/ML IJ SOLN
0.0000 [IU] | Freq: Three times a day (TID) | INTRAMUSCULAR | Status: DC
  Administered 2023-10-26 – 2023-10-27 (×3): 15 [IU] via SUBCUTANEOUS
  Administered 2023-10-27: 3 [IU] via SUBCUTANEOUS
  Administered 2023-10-27 – 2023-10-28 (×2): 7 [IU] via SUBCUTANEOUS
  Administered 2023-10-28: 4 [IU] via SUBCUTANEOUS
  Administered 2023-10-28 – 2023-10-29 (×3): 7 [IU] via SUBCUTANEOUS
  Administered 2023-10-29: 11 [IU] via SUBCUTANEOUS
  Administered 2023-10-30: 4 [IU] via SUBCUTANEOUS
  Administered 2023-10-30: 7 [IU] via SUBCUTANEOUS
  Administered 2023-10-30: 11 [IU] via SUBCUTANEOUS
  Administered 2023-10-31: 4 [IU] via SUBCUTANEOUS
  Administered 2023-10-31: 15 [IU] via SUBCUTANEOUS
  Administered 2023-10-31 – 2023-11-01 (×2): 11 [IU] via SUBCUTANEOUS
  Administered 2023-11-01: 7 [IU] via SUBCUTANEOUS
  Administered 2023-11-01 – 2023-11-02 (×2): 4 [IU] via SUBCUTANEOUS
  Administered 2023-11-02 (×2): 11 [IU] via SUBCUTANEOUS
  Administered 2023-11-03: 15 [IU] via SUBCUTANEOUS
  Administered 2023-11-03: 20 [IU] via SUBCUTANEOUS

## 2023-10-26 MED ORDER — AMIODARONE HCL IN DEXTROSE 360-4.14 MG/200ML-% IV SOLN
30.0000 mg/h | INTRAVENOUS | Status: DC
  Administered 2023-10-27: 30 mg/h via INTRAVENOUS
  Administered 2023-10-27 – 2023-10-28 (×4): 60 mg/h via INTRAVENOUS
  Administered 2023-10-28 – 2023-10-29 (×2): 30 mg/h via INTRAVENOUS
  Filled 2023-10-26 (×8): qty 200

## 2023-10-26 MED ORDER — PANTOPRAZOLE SODIUM 40 MG PO TBEC
40.0000 mg | DELAYED_RELEASE_TABLET | Freq: Two times a day (BID) | ORAL | Status: DC
  Administered 2023-10-26 – 2023-11-03 (×17): 40 mg via ORAL
  Filled 2023-10-26 (×17): qty 1

## 2023-10-26 MED ORDER — HEPARIN (PORCINE) 25000 UT/250ML-% IV SOLN
900.0000 [IU]/h | INTRAVENOUS | Status: DC
  Administered 2023-10-26: 1100 [IU]/h via INTRAVENOUS
  Administered 2023-10-27: 1000 [IU]/h via INTRAVENOUS
  Administered 2023-10-29: 900 [IU]/h via INTRAVENOUS
  Filled 2023-10-26 (×4): qty 250

## 2023-10-26 MED ORDER — INSULIN GLARGINE 100 UNIT/ML ~~LOC~~ SOLN
5.0000 [IU] | Freq: Every day | SUBCUTANEOUS | Status: AC
  Administered 2023-10-26: 5 [IU] via SUBCUTANEOUS
  Filled 2023-10-26: qty 0.05

## 2023-10-26 MED ORDER — MAGNESIUM SULFATE 4 GM/100ML IV SOLN
4.0000 g | Freq: Once | INTRAVENOUS | Status: AC
  Administered 2023-10-26: 4 g via INTRAVENOUS
  Filled 2023-10-26: qty 100

## 2023-10-26 MED ORDER — LACTATED RINGERS IV BOLUS
500.0000 mL | Freq: Once | INTRAVENOUS | Status: AC
  Administered 2023-10-26: 500 mL via INTRAVENOUS

## 2023-10-26 NOTE — Progress Notes (Signed)
 OT Cancellation Note  Patient Details Name: RISHAAN GUNNER MRN: [id number] DOB: 15-Apr-1951   Cancelled Treatment:    Reason Eval/Treat Not Completed: Medical issues which prohibited therapy Pt fatigued after working with PT, also with hypotensive issues. Will hold OT attempts and follow up tomorrow.   Lorre Munroe 10/26/2023, 1:11 PM

## 2023-10-26 NOTE — Progress Notes (Signed)
 [facility] ADULT ICU REPLACEMENT PROTOCOL   The patient does apply for the [facility] Adult ICU Electrolyte Replacment Protocol based on the criteria listed below:   1.Exclusion criteria: TCTS, ECMO, Dialysis, and Myasthenia Gravis patients 2. Is GFR >/= 30 ml/min? Yes.    Patient's GFR today is >60 3. Is SCr </= 2? Yes.   Patient's SCr is 0.92 mg/dL 4. Did SCr increase >/= 0.5 in 24 hours? No. 5.Pt's weight >40kg  Yes.   6. Abnormal electrolyte(s): phos 1.8, mag 1.6  7. Electrolytes replaced per protocol 8.  Call MD STAT for K+ </= 2.5, Phos </= 1, or Mag </= 1 Physician:  protocol  [name] 10/26/2023 5:02 AM

## 2023-10-26 NOTE — Evaluation (Signed)
 Physical Therapy Evaluation Patient Details Name: Tony Cantu: [id number] DOB: 1950/11/07 Today's Date: 10/26/2023  History of Present Illness  73 y.o. male admitted by transfer via EMS 2/25 from [facility] from  [facility] ED for syncope. Dx: Shock, likely hypovolemic and DKA. PMHx:  type 1 diabetes, A-fib on Eliquis, cirrhosis, seizure disorder, aortic stenosis, hypertension, peptic ulcer disease, OA with bilateral knee replacement, polyclonal gammopathy, OSA on CPAP.  Clinical Impression  Pt admitted with above diagnosis. Limited assessment due to drop in BP after rising to EOB, symptomatic of dizziness, increased lethargy. Required pt to be placed in trendelenburg to raise MAP >65; RN notified, in room at end of session. Hx mostly accurate based on prior notes however pt unsure if he arrived to hospital from home or rehab, but recalls working PTs on gait, transfers, and stationary bike PTA. Pt currently with functional limitations due to the deficits listed below (see PT Problem List). Pt will benefit from acute skilled PT to increase their independence and safety with mobility to allow discharge.     Pre-activity supine in bed BP 101/59 (MAP 71). Seated EOB 89/51 (63), reclined 89/45 (59), trendelenburg 87/55 (66). RN notified - arrived to room to provide meds.         If plan is discharge home, recommend the following: Two people to help with walking and/or transfers;A lot of help with bathing/dressing/bathroom;Assistance with cooking/housework;Direct supervision/assist for medications management;Direct supervision/assist for financial management;Assist for transportation;Help with stairs or ramp for entrance;Supervision due to cognitive status   Can travel by private vehicle   No    Equipment Recommendations None recommended by PT  Recommendations for Other Services       Functional Status Assessment Patient has had a recent decline in their functional status and  demonstrates the ability to make significant improvements in function in a reasonable and predictable amount of time.     Precautions / Restrictions Precautions Precautions: Fall;Other (comment) Recall of Precautions/Restrictions: Impaired Precaution/Restrictions Comments: Monitor BP; Lt basilic picc line Restrictions Weight Bearing Restrictions Per Provider Order: No      Mobility  Bed Mobility Overal bed mobility: Needs Assistance Bed Mobility: Rolling, Sidelying to Sit, Sit to Sidelying Rolling: Min assist, Used rails Sidelying to sit: Mod assist, HOB elevated, Used rails, +2 for safety/equipment     Sit to sidelying: Mod assist, HOB elevated, +2 for safety/equipment General bed mobility comments: Min assist to roll in bed, cues for technique, use of rail as able with RUE. Mod assist +2 for safety with transitions to EOB. Leaning towards Lt. Able to reach and hold rail on Rt to stabilize. LE support back into bed with Mod assist. Symptomatic with dizziness, low BP while EOB, quickly returned to bed.    Transfers                   General transfer comment: Deferred due to drop in BP    Ambulation/Gait                  Stairs            Wheelchair Mobility     Tilt Bed    Modified Rankin (Stroke Patients Only)       Balance Overall balance assessment: Needs assistance Sitting-balance support: Feet supported, Single extremity supported Sitting balance-Leahy Scale: Poor Sitting balance - Comments: Held rail to stay in midline Postural control: Left lateral lean  Pertinent Vitals/Pain Pain Assessment Pain Assessment: No/denies pain    Home Living Family/patient expects to be discharged to:: Skilled nursing facility Living Arrangements: Spouse/significant other Available Help at Discharge:  (Unknown - states wife unable to assist) Type of Home: House Home Access: Stairs to enter Entrance  Stairs-Rails: None Entrance Stairs-Number of Steps: 1+1   Home Layout: Able to live on main level with bedroom/bathroom Home Equipment: Agricultural consultant (2 wheels);Cane - single point;Standard Walker;BSC/3in1 Additional Comments: Pt report fairly consistent with prior reports in EMR    Prior Function Prior Level of Function : Needs assist             Mobility Comments: States he was beginning to walk 15 feet with assist at SNF. Could ride bike x15 min. Using RW. ADLs Comments: Needs assist for ADLs at [facility]     Extremity/Trunk Assessment   Upper Extremity Assessment Upper Extremity Assessment: Defer to OT evaluation    Lower Extremity Assessment Lower Extremity Assessment: Generalized weakness;Difficult to assess due to impaired cognition       Communication   Communication Communication: No apparent difficulties    Cognition Arousal: Alert Behavior During Therapy: WFL for tasks assessed/performed   PT - Cognitive impairments: No family/caregiver present to determine baseline, Memory, Initiation, Sequencing, Problem solving                       PT - Cognition Comments: Oriented x3; decreased short term memory. Confusion about PLOF, where he was staying (home v SNF) prior to admission. Following commands: Impaired Following commands impaired: Follows one step commands with increased time, Follows one step commands inconsistently     Cueing Cueing Techniques: Verbal cues, Tactile cues     General Comments General comments (skin integrity, edema, etc.): Pre-activity supine in bed BP 101/59 (MAP 71). Seated EOB 89/51 (63), reclined 89/45 (59), trendelenburg 87/55 (66). RN notified - provided levo.    Exercises General Exercises - Lower Extremity Ankle Circles/Pumps: AROM, Both, 10 reps, Supine Quad Sets: Strengthening, Both, 10 reps, Supine   Assessment/Plan    PT Assessment Patient needs continued PT services  PT Problem List Decreased strength;Decreased  range of motion;Decreased activity tolerance;Decreased balance;Decreased mobility;Decreased cognition;Decreased knowledge of use of DME;Decreased safety awareness;Decreased knowledge of precautions;Cardiopulmonary status limiting activity;Pain       PT Treatment Interventions DME instruction;Gait training;Functional mobility training;Therapeutic activities;Therapeutic exercise;Balance training;Neuromuscular re-education;Patient/family education;Cognitive remediation;Wheelchair mobility training    PT Goals (Current goals can be found in the Care Plan section)  Acute Rehab PT Goals Patient Stated Goal: Get well PT Goal Formulation: With patient Time For Goal Achievement: 11/09/23 Potential to Achieve Goals: Good    Frequency Min 1X/week     Co-evaluation               AM-PAC PT "6 Clicks" Mobility  Outcome Measure Help needed turning from your back to your side while in a flat bed without using bedrails?: A Little Help needed moving from lying on your back to sitting on the side of a flat bed without using bedrails?: A Lot Help needed moving to and from a bed to a chair (including a wheelchair)?: Total Help needed standing up from a chair using your arms (e.g., wheelchair or bedside chair)?: Total Help needed to walk in hospital room?: Total Help needed climbing 3-5 steps with a railing? : Total 6 Click Score: 9    End of Session   Activity Tolerance: Treatment limited secondary to medical complications (Comment) (  hypotensive) Patient left: in bed;with call bell/phone within reach;with bed alarm set;with nursing/sitter in room (Trendelenburg) Nurse Communication: Mobility status;Other (comment) (hypotensive) PT Visit Diagnosis: Muscle weakness (generalized) (M62.81);Difficulty in walking, not elsewhere classified (R26.2);History of falling (Z91.81);Other symptoms and signs involving the nervous system (R29.898);Dizziness and giddiness (R42);Pain Pain - Right/Left:  (BIL) Pain  - part of body: Knee    Time: 4098-1191 PT Time Calculation (min) (ACUTE ONLY): 25 min   Charges:   PT Evaluation $PT Eval Moderate Complexity: 1 Mod PT Treatments $Therapeutic Activity: 8-22 mins PT General Charges $$ ACUTE PT VISIT: 1 Visit         Kathlyn Sacramento, PT, DPT [facility]  Rehabilitation Services Physical Therapist Office: [phone] Website: [email or web address]   Berton Mount 10/26/2023, 2:36 PM

## 2023-10-26 NOTE — Progress Notes (Signed)
 PHARMACY - ANTICOAGULATION CONSULT NOTE  Pharmacy Consult for heparin gtt Indication: atrial fibrillation  Allergies  Allergen Reactions   Dimetapp Children's Cold-Cough Other (See Comments)    Chest discomfort    Erythromycin Diarrhea   Sulfonamide Derivatives Diarrhea    Patient Measurements: Weight: 84.8 kg (186 lb 15.2 oz) Heparin Dosing Weight: 84.8 kg  Vital Signs: Temp: 98.6 F (37 C) (02/27 1300) Temp Source: Oral (02/27 1300) BP: 90/46 (02/27 1900) Pulse Rate: 73 (02/27 1900)  Labs: Recent Labs    10/24/23 1744 10/24/23 1800 10/24/23 2053 10/25/23 0226 10/25/23 0524 10/25/23 0659 10/25/23 1235 10/25/23 1459 10/25/23 1830 10/26/23 0335 10/26/23 0438  HGB 12.1*  --  12.5*  --   --  10.1* 9.4*  --  7.9*  --  10.7*  HCT 36.0*  --  37.1*  --   --  29.4* 27.9*  --  23.7*  --  31.8*  PLT 162  --  167  --   --  131*  --   --   --   --   --   LABPROT  --   --  15.5*  --   --   --   --   --   --   --   --   INR  --   --  1.2  --   --   --   --   --   --   --   --   CREATININE 2.07*   < > 1.83* 1.38* 1.14  --   --   --   --  0.92  --   TROPONINIHS  --    < > 49*  --   --  61*  --  64*  --   --   --    < > = values in this interval not displayed.    Estimated Creatinine Clearance: 74.7 mL/min (by C-G formula based on SCr of 0.92 mg/dL).   Medical History: Past Medical History:  Diagnosis Date   Aortic atherosclerosis (HCC)    Aortic stenosis    Arthritis    Asthma    BPH (benign prostatic hypertrophy)    Cirrhosis (HCC) 2016   Colon polyps    Diabetes mellitus without complication (HCC)    TYPE 1    Diverticulosis    Dysrhythmia 04/19/2021   A-fib noted with possible A-fib RVR   Gastric ulcer    Gastric varices    right   Gastritis    GERD (gastroesophageal reflux disease)    Headache    History- pt states these were migraines that occurred in the 1980's   Heart murmur    History of kidney stones    Hx of adenomatous colonic polyps     Hypertension    Iron deficiency anemia due to chronic blood loss 08/16/2023   Portal hypertensive gastropathy (HCC)    Renal cyst, right    Seizures (HCC)    HYPOGLYCEMIC LAST 1 AND 1/2 YRS AGO   Sleep apnea    uses CPAP nightly   Testicle trouble    one testicle BORN WITH   Tubular adenoma of colon     Medications:  Medications Prior to Admission  Medication Sig Dispense Refill Last Dose/Taking   acetaminophen (TYLENOL) 500 MG tablet Take 1,000 mg by mouth every 6 (six) hours as needed for moderate pain.   Taking As Needed   apixaban (ELIQUIS) 5 MG TABS tablet Take 1 tablet (5 mg total)  by mouth 2 (two) times daily. 180 tablet 3 10/23/2023   atorvastatin (LIPITOR) 40 MG tablet Take 1 tablet (40 mg total) by mouth daily. 90 tablet 3 10/23/2023   cefTRIAXone (ROCEPHIN) IVPB Inject 2 g into the vein daily. Indication:  10/18/23 First Dose: Yes Last Day of Therapy:  10/18/23 Labs - Once weekly:  CBC/D and BMP, Labs - Once weekly: ESR and CRP   10/19/2023   cholecalciferol (VITAMIN D3) 25 MCG (1000 UNIT) tablet Take 5,000 Units by mouth daily.   10/23/2023   dextromethorphan-guaiFENesin (ROBITUSSIN-DM) 10-100 MG/5ML liquid Take 10 mLs by mouth in the morning and at bedtime.   10/23/2023   diltiazem (CARDIZEM) 30 MG tablet Take 1 tablet (30 mg total) by mouth 2 (two) times daily. Patient must schedule annual office visit for further refills 15 tablet 0 10/23/2023   ferrous sulfate 325 (65 FE) MG EC tablet Take 1 tablet (325 mg total) by mouth daily with breakfast. 90 tablet 3 10/23/2023   fluconazole (DIFLUCAN) 200 MG tablet Take 400 mg by mouth daily.   10/15/2023   furosemide (LASIX) 20 MG tablet Take 1.5 tablets (30 mg total) by mouth daily. 135 tablet 3 10/24/2023 Morning   Heparin Na, Pork, Lock Flsh PF 100 UNIT/ML SOLN Inject 5 mLs into the vein daily.   10/23/2023   insulin aspart (NOVOLOG) 100 UNIT/ML injection Inject 2-15 Units into the skin 4 (four) times daily -  before meals and at  bedtime. CBG 121 - 150: 2 units, CBG 151 - 200: 3 units, CBG 201 - 250: 5 units, CBG 251 - 300: 8 units, CBG 301 - 350: 11 units, CBG 351 - 400: 15 units, CBG > 400call MD   10/23/2023   insulin aspart (NOVOLOG) 100 UNIT/ML injection Inject 8 Units into the skin 3 (three) times daily before meals.   10/23/2023   insulin glargine, 2 Unit Dial, (TOUJEO MAX SOLOSTAR) 300 UNIT/ML Solostar Pen Inject 20 Units into the skin daily.   10/22/2023   magnesium hydroxide (MILK OF MAGNESIA) 400 MG/5ML suspension Take 30 mLs by mouth daily as needed for mild constipation.   10/14/2023   magnesium oxide (MAG-OX) 400 (240 Mg) MG tablet Take 400 mg by mouth daily.   10/23/2023   metformin (FORTAMET) 1000 MG (OSM) 24 hr tablet Take 1,000 mg by mouth daily with breakfast.   10/23/2023   metoprolol succinate (TOPROL-XL) 25 MG 24 hr tablet Take 1 tablet (25 mg total) by mouth daily.   10/23/2023   montelukast (SINGULAIR) 10 MG tablet Take 1 tablet (10 mg total) by mouth at bedtime. 90 tablet 3 10/22/2023   Multiple Vitamins-Minerals (EYE HEALTH) CAPS Take 1 capsule by mouth daily.   10/23/2023   Multiple Vitamins-Minerals (MULTIVITAMIN WITH MINERALS) tablet Take 1 tablet by mouth daily.   10/23/2023   pantoprazole (PROTONIX) 40 MG tablet TAKE 1 TABLET BY MOUTH TWICE  DAILY BEFORE MEALS 180 tablet 1 10/23/2023   potassium chloride SA (KLOR-CON M) 20 MEQ tablet Take 1 tablet (20 mEq total) by mouth daily. 90 tablet 3 10/23/2023   ramelteon (ROZEREM) 8 MG tablet Take 1 tablet (8 mg total) by mouth at bedtime. 90 tablet 3 10/22/2023   Testosterone 30 MG/ACT SOLN Apply 1 pump under each axilla once daily. 270 mL 1 10/23/2023   valsartan (DIOVAN) 40 MG tablet Take 1 tablet (40 mg total) by mouth daily. 90 tablet 3 10/22/2023   b complex vitamins tablet Take 1 tablet by mouth daily. (Patient not  taking: Reported on 10/25/2023)   Not Taking   Blood Glucose Monitoring Suppl (CONTOUR NEXT ONE) KIT Test BS QID and as needed Dx E11.9 500 kit 3     Cholecalciferol (DIALYVITE VITAMIN D 5000) 125 MCG (5000 UT) capsule Take 5,000 Units by mouth daily. (Patient not taking: Reported on 10/25/2023)   Not Taking   Chromium Picolinate (CHROMIUM PICOLATE PO) Take 1 tablet by mouth daily. (Patient not taking: Reported on 10/25/2023)   Not Taking   CINNAMON PO Take 1,080 mg by mouth daily. (Patient not taking: Reported on 10/25/2023)   Not Taking   Coenzyme Q10 (COQ10) 100 MG CAPS Take 100 mg by mouth daily. (Patient not taking: Reported on 10/25/2023)   Not Taking   Continuous Blood Gluc Sensor (FREESTYLE LIBRE SENSOR SYSTEM) MISC Check BS eight (8) times a day. Dx E10.9 7 each 3    Glucagon, rDNA, (GLUCAGON EMERGENCY) 1 MG KIT INJECT AS DIRECTED INTO  UPPER ARM, THIGH OR  BUTTOCKS AS NEEDED FOR  SEVERE HYPOGLYCEMIA. SEEK  MEDICAL ATTENTION AFTER USE (Patient not taking: Reported on 10/25/2023) 3 kit 0 Not Taking   Glucosamine-Chondroit-Vit C-Mn (GLUCOSAMINE 1500 COMPLEX) CAPS Take 2 capsules by mouth daily. (Patient not taking: Reported on 10/25/2023)   Not Taking   glucose blood (CONTOUR NEXT TEST) test strip Test BS QID and as needed Dx E11.9 400 strip 3    Microlet Lancets MISC Test BS QID and as needed Dx E11.9 500 each 3    Turmeric Curcumin 500 MG CAPS Take 500 mg by mouth daily. (Patient not taking: Reported on 10/25/2023)   Not Taking    Assessment: 57 YOM w/ PMH that includes AF on apixaban PTA. Last dose taken 2/24 at 0800 He was admitted w/ hematemasis c/g GIB. GI saw patient stating he had a recent EGD showing esophageal varices, nonbleeding, nonbleeding AVM's. Out of concern for potential of active bleed, will initiate heparin infusion with lower goals. No bolus will be given  Goal of Therapy:  Heparin level 0.3-0.5 units/ml aPTT 66-85 seconds Monitor platelets by anticoagulation protocol: Yes   Plan:  Start heparin infusion at 1100 units/hr Check heparin level and aPTT in 8 hours and daily while on heparin Continue to monitor H&H and  platelets  Kollin Udell BS, PharmD, BCPS Clinical Pharmacist 10/26/2023 7:42 PM  Contact: [phone] after 3 PM  "Be curious, not judgmental..." -Debbora Dus

## 2023-10-26 NOTE — Progress Notes (Addendum)
 NAME:  Tony Cantu, MRN:  [id number], DOB:  04/23/1951, LOS: 2 ADMISSION DATE:  10/24/2023, CONSULTATION DATE:  10/23/2023 REFERRING MD:  Gwenyth Bouillon, CHIEF COMPLAINT:  syncope    History of Present Illness:  This is a 73 y.o. male with hx of type 1 diabetes, A-fib on Eliquis, cirrhosis, seizure disorder, aortic stenosis, hypertension, peptic ulcer disease, OA with bilateral knee replacement, polyclonal gammopathy, OSA on CPAP brought in by EMS from [facility] to [facility] ED) on 2/24 for syncope. Per [facility] note: The ED provider had called [facility] rehab who notified that the patient had a syncopal episode that was not witnessed. Patient was found in his wheelchair, no fall. Patient was unresponsive for 10 to 15 minutes with low blood pressure.  He was brought to the ED with altered mental status and lethargic.  Initial vitals showed blood pressure of 67/41, bradycardia 55, afebrile.  Labs were notable for creatinine 2.27, troponin 67, lactate 6.8. BNP 639.  CT CAP showed findings of mild fecal impaction with possible sterile coral colitis.  Rectal exam notable for frank melena, no overt bleed with guaiac positive.  Patient remained hypotensive even after fluid resuscitation, he was started on Levophed drip.  EDP consulted general surgery who recommended no surgical evaluation at this time.  EDP also consulted GI at [facility] health who recommended holding blood thinners and plan to see him in the morning.  Patient is brought to [facility] today 2/25, evaluated at bedside. Poor historian but oriented to self and place. Reports about 2 weeks of generalized weakness and fatigue. Some lightheadedness but no dizziness. Endorse cough with notably blood. Endorses melanotic stools. Denies chest pain, SOB, abdominal pain, n/v. Poor appetite for past few weeks.  Of note: 01/29 recently discharged from [facility] for Sepsis Secondary Streptococcus Salivarius Bacteremia and esophogeal candidiasis with PICC line  to complete 4 weeks of IV ceftriaxone with end of treatment date 10/18/2023 and 3 weeks of p.o. Fluconazole.   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   Significant Hospital Events: Including procedures, antibiotic start and stop dates in addition to other pertinent events   2/25 transfer from [facility] for hematemesis c/f GI bleed and hypotensive needing levophed 2/26 started on precedex overnight due to agitation, GI signed off  Interim History / Subjective:  Alert and oriented. Denies any abdominal pain. No signs of bleeding.   Objective   Blood pressure 116/67, pulse 97, temperature 98 F (36.7 C), temperature source Oral, resp. rate (!) 21, weight 84.8 kg, SpO2 95%.        Intake/Output Summary (Last 24 hours) at 10/26/2023 0640 Last data filed at 10/26/2023 0354 Gross per 24 hour  Intake 2853.45 ml  Output 3500 ml  Net -646.55 ml   Filed Weights   10/24/23 1709 10/26/23 0500  Weight: 81.3 kg 84.8 kg    Examination: General: awake, laying in bed, in no acute distress HENT: [place]/AT Lungs: normal effort on [place], clear bilaterally  Cardiovascular: RRR, systolic murmur at RUSB Abdomen: bowel sounds present, soft, non-tender, non-distended Extremities: no LE edema Neuro: alert and oriented x 3, moves all extremities   Resolved Hospital Problem list     Assessment & Plan:  Shock, likely hypovolemic and DKA (initially c/f UGIB) Syncope Hx of  hematemesis and melena c/f UGIB Hx of Esophageal candidiasis Hx of Portal hypertensive gastropathy Hx of 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 He was discharged on 1/29 with PICC line to complete 4 weeks of IV ceftriaxone with EOT 10/18/2023 and 3 weeks of p.o. Fluconazole. CXR unremarkable on admission. UA from OSH unremarkable.  - Afebrile and no leukocytosis - DKA resolved - GI has signed off, no plans for endoscopy - stopped octreotide and Rocephin for SBP ppx (no hx of varices)  - Protonix 40 mg BID - Levophed for MAP > 65, wean as tolerated - trend lactic acid, last 3.6 - wean supplemental O2 as tolerated   DKA, resolved T1DM Recent A1c 4.4. Elevated AG >15, BG 469. BHB elevated at admission. Started on Endotool. AG closed x 2, BHB trended down, transitioned to SQ insulin. - CBG in 200s this AM - increase glargine to 25 units daily - add novolog 4 units TID AC - SSI-R   Anemia, Macrocytic  Hx of IDA - Iron studies unremarkable  - Hgb stable ~10, admit was 12.1 (appears BL 10-11) - trend CBC   AKI  Admit Scr 2.07 with baseline 0.8-0.9. Improved to baseline s/p IVF. - trend BMP - avoid nephrotoxic agents  - strict Is & Os  Hx of Streptococcus salivarius bacteremia  Recent hospitalization. Repeat Bcx 1/22 negative, was to complete IV abx via PICC with EOT 2/19. - [facility] collected Bcx x 2 on 2/24 - No fevers or leukocytosis on admission, remains afebrile  - Repeat Bcx here, so far no growth    Hx of Afib on Eliquis Severe aortic stenosis  - Hold home Dilt 30 mg BID and Toprol XL 25 mg daily  - Remains NSR and rate controlled  - Hold Eliquis due to c/f GIB, plan to resume if Hgb stable tomorrow   OSA  Hypoglossal Nerve Stimulator - Has R nerve stimulator in place, no signs of overlying infection of area - CPAP at bedtime   Hx Seizure  - No dispense history of Keppra per chart review.   Best Practice (right click and "Reselect all SmartList Selections" daily)   Diet/type: Regular consistency (see  orders) DVT prophylaxis SCD Pressure ulcer(s): Elevated by RN GI prophylaxis: PPI Lines: PICC  Foley:  N/A Code Status:  full code Last date of multidisciplinary goals of care discussion [emergency contact updated ]  Labs   CBC: Recent Labs  Lab 10/24/23 1744 10/24/23 2053 10/25/23 0659 10/25/23 1235 10/25/23 1830 10/26/23 0438  WBC 8.6 8.3 6.9  --   --   --   HGB 12.1* 12.5* 10.1* 9.4* 7.9* 10.7*  HCT 36.0* 37.1* 29.4* 27.9* 23.7* 31.8*  MCV 104.0* 107.2* 103.2*  --   --   --   PLT 162 167 131*  --   --   --     Basic Metabolic Panel: Recent Labs  Lab 10/24/23 1800 10/24/23 2053 10/25/23 0226 10/25/23 0524 10/25/23 1235 10/25/23 1530 10/26/23 0335  NA 134* 136 140 138  --   --  137  K 4.6 5.0 3.5 3.2*  --   --  3.8  CL 98 98 106 108  --   --  103  CO2 15* 14* 24 25  --   --  24  GLUCOSE 467*  470* 168* 168*  --   --  229*  BUN 31* 30* 22 18  --   --  11  CREATININE 2.09* 1.83* 1.38* 1.14  --   --  0.92  CALCIUM 10.0 10.3 9.8 9.5  --   --  8.7*  MG  --   --   --   --  1.1*  --  1.6*  PHOS  --   --   --   --   --  2.1* 1.8*   GFR: Estimated Creatinine Clearance: 74.7 mL/min (by C-G formula based on SCr of 0.92 mg/dL). Recent Labs  Lab 10/24/23 1744 10/24/23 2053 10/25/23 0510 10/25/23 0659 10/25/23 1230  PROCALCITON  --   --   --  0.18  --   WBC 8.6 8.3  --  6.9  --   LATICACIDVEN 3.8* 3.9* 3.5*  --  3.6*    Liver Function Tests: Recent Labs  Lab 10/24/23 1744  AST 42*  ALT 40  ALKPHOS 123  BILITOT 1.5*  PROT 6.8  ALBUMIN 2.5*   No results for input(s): "LIPASE", "AMYLASE" in the last 168 hours. Recent Labs  Lab 10/25/23 1420  AMMONIA 32    ABG    Component Value Date/Time   HCO3 21.7 09/20/2023 0532   ACIDBASEDEF 2.5 (H) 09/20/2023 0532   O2SAT 61.6 09/20/2023 0532     Coagulation Profile: Recent Labs  Lab 10/24/23 2053  INR 1.2    Cardiac Enzymes: No results for input(s): "CKTOTAL", "CKMB", "CKMBINDEX", "TROPONINI" in the  last 168 hours.  HbA1C: HB A1C (BAYER DCA - WAIVED)  Date/Time Value Ref Range Status  09/13/2023 11:08 AM 4.6 (L) 4.8 - 5.6 % Final    Comment:             Prediabetes: 5.7 - 6.4          Diabetes: >6.4          Glycemic control for adults with diabetes: <7.0   04/26/2023 10:47 AM 4.2 (L) 4.8 - 5.6 % Final    Comment:             Prediabetes: 5.7 - 6.4          Diabetes: >6.4          Glycemic control for adults with diabetes: <7.0    Hgb A1c MFr Bld  Date/Time Value Ref Range Status  09/19/2023 06:48 PM 4.4 (L) 4.8 - 5.6 % Final    Comment:    (NOTE) Pre diabetes:          5.7%-6.4%  Diabetes:              >6.4%  Glycemic control for   <7.0% adults with diabetes     CBG: Recent Labs  Lab 10/25/23 1202 10/25/23 1511 10/25/23 1930 10/26/23 0001 10/26/23 0351  GLUCAP 128* 143* 198* 247* 243*    Review of Systems:   As per HPI   Past Medical History:  He,  has a past medical history of Aortic atherosclerosis (HCC), Aortic stenosis, Arthritis, Asthma, BPH (benign prostatic hypertrophy), Cirrhosis (HCC) (2016), Colon polyps, Diabetes mellitus without complication (HCC), Diverticulosis, Dysrhythmia (04/19/2021), Gastric ulcer, Gastric varices, Gastritis, GERD (gastroesophageal reflux disease), Headache, Heart murmur, History of kidney stones, adenomatous colonic polyps, Hypertension, Iron deficiency anemia due to chronic blood loss (08/16/2023), Portal hypertensive gastropathy (HCC), Renal cyst, right, Seizures (HCC), Sleep apnea, Testicle trouble, and Tubular adenoma of colon.   Surgical History:   Past Surgical History:  Procedure Laterality Date   BACK SURGERY     89  LOWER    CARPAL TUNNEL RELEASE Right 10/13/2015   Procedure: RIGHT CARPAL TUNNEL RELEASE;  Surgeon: Cindee Salt, MD;  Location: [facility];  Service: Orthopedics;  Laterality: Right;   CARPAL TUNNEL RELEASE Left 07/05/2016   Procedure: LEFT CARPAL TUNNEL RELEASE;  Surgeon: Cindee Salt, MD;  Location: [facility];  Service: Orthopedics;  Laterality: Left;   COLONOSCOPY WITH ESOPHAGOGASTRODUODENOSCOPY (EGD)  10/05/2022   DRUG INDUCED ENDOSCOPY N/A 07/02/2021   Procedure: DRUG INDUCED SLEEP ENDOSCOPY;  Surgeon: Christia Reading, MD;  Location: [facility] OR;  Service: ENT;  Laterality: N/A;   ESOPHAGOGASTRODUODENOSCOPY (EGD) WITH PROPOFOL N/A 09/25/2023   Procedure: ESOPHAGOGASTRODUODENOSCOPY (EGD) WITH PROPOFOL;  Surgeon: Sherrilyn Rist, MD;  Location: [facility] ENDOSCOPY;  Service: Gastroenterology;  Laterality: N/A;  EGD needs to be coordinated with TEE.  Patient has severe aortic stenosis, procedures need to be done to follow each other with 1 sedation   IMPLANTATION OF HYPOGLOSSAL NERVE STIMULATOR Right 10/01/2021   Procedure: IMPLANTATION OF HYPOGLOSSAL NERVE STIMULATOR;  Surgeon: Christia Reading, MD;  Location: [facility] OR;  Service: ENT;  Laterality: Right;   INGUINAL HERNIA REPAIR  2003   right    KNEE ARTHROSCOPY Right 03/03/2016   LUMBAR DISC SURGERY  03/1995   Dr. Daleen Squibb, discectomy   LUMBAR LAMINECTOMY/DECOMPRESSION MICRODISCECTOMY Right 10/06/2016   Procedure: RIGHT LUMBAR THREE - LUMBAR FOUR  LAMINECTOMY, FORAMINOTOMY AND MICRODISCECTOMY;  Surgeon: Shirlean Kelly, MD;  Location: [facility] OR;  Service: Neurosurgery;  Laterality: Right;   SHOULDER SURGERY  11/28/2005   left partial   SHOULDER SURGERY  07/14/2006   RIGHT   TEE WITHOUT CARDIOVERSION N/A 09/25/2023   Procedure: TRANSESOPHAGEAL ECHOCARDIOGRAM (TEE);  Surgeon: Dolores Patty, MD;  Location: [facility];  Service: Cardiovascular;  Laterality: N/A;   TONSILLECTOMY  AGE 6 OR 4   TOTAL KNEE ARTHROPLASTY Right 03/07/2022   Procedure: RIGHT TOTAL KNEE ARTHROPLASTY;  Surgeon: Gean Birchwood, MD;  Location: [facility];  Service: Orthopedics;  Laterality: Right;   TOTAL KNEE ARTHROPLASTY Left 06/06/2022   Procedure: LEFT TOTAL KNEE ARTHROPLASTY;  Surgeon: Gean Birchwood, MD;  Location: [facility];  Service: Orthopedics;   Laterality: Left;   TOTAL SHOULDER ARTHROPLASTY Right 11/01/2018   Procedure: RIGHT SHOULDER REVISION TO REVERSE TOTAL SHOULDER;  Surgeon: Jones Broom, MD;  Location: [facility];  Service: Orthopedics;  Laterality: Right;  CHOICE ANESTHESIA WITH INTERSCALENE BLOCK EXPAREL, NEEDS RNFA     Social History:   reports that he quit smoking about 20 years ago. His smoking use included cigarettes and pipe. He started smoking about 54 years ago. He has never used smokeless tobacco. He reports that he does not currently use alcohol. He reports that he does not use drugs.   Family History:  His family history includes Allergic rhinitis in his sister; Alzheimer's disease in his mother; Asthma in his father; Breast cancer in his maternal grandmother; Diabetes in his mother; Heart attack in his maternal grandmother and mother; Heart failure in his maternal grandmother and mother; Rheum arthritis in his maternal grandmother; Suicidality in his father. There is no history of Colon cancer, Esophageal cancer, Pancreatic cancer, or Liver disease.   Allergies Allergies  Allergen Reactions   Dimetapp Children's Cold-Cough Other (See Comments)    Chest discomfort    Erythromycin Diarrhea   Sulfonamide Derivatives Diarrhea     Home Medications  Prior to Admission medications   Medication Sig Start Date  End Date Taking? Authorizing Provider  acetaminophen (TYLENOL) 500 MG tablet Take 1,000 mg by mouth every 6 (six) hours as needed for moderate pain.    [provider]  apixaban (ELIQUIS) 5 MG TABS tablet Take 1 tablet (5 mg total) by mouth 2 (two) times daily. 06/08/23   [name], DO  atorvastatin (LIPITOR) 40 MG tablet Take 1 tablet (40 mg total) by mouth daily. 06/08/23   [name], DO  b complex vitamins tablet Take 1 tablet by mouth daily.    [provider]  Blood Glucose Monitoring Suppl (CONTOUR NEXT ONE) KIT Test BS QID and as needed Dx E11.9 06/04/20   Delynn Flavin M, DO  cefTRIAXone (ROCEPHIN) IVPB Inject 2 g into the vein daily. Indication:  10/18/23 First Dose: Yes Last Day of Therapy:  10/18/23 Labs - Once weekly:  CBC/D and BMP, Labs - Once weekly: ESR and CRP 09/27/23   Lewie Chamber, MD  Cholecalciferol (DIALYVITE VITAMIN D 5000) 125 MCG (5000 UT) capsule Take 5,000 Units by mouth daily.    [provider]  Chromium Picolinate (CHROMIUM PICOLATE PO) Take 1 tablet by mouth daily.    [provider]  CINNAMON PO Take 1,080 mg by mouth daily.    [provider]  Coenzyme Q10 (COQ10) 100 MG CAPS Take 100 mg by mouth daily.    [provider]  Continuous Blood Gluc Sensor (FREESTYLE LIBRE SENSOR SYSTEM) MISC Check BS eight (8) times a day. Dx E10.9 06/29/22   Delynn Flavin M, DO  diltiazem (CARDIZEM) 30 MG tablet Take 1 tablet (30 mg total) by mouth 2 (two) times daily. Patient must schedule annual office visit for further refills 06/26/23   Rollene Rotunda, MD  ferrous sulfate 325 (65 FE) MG EC tablet Take 1 tablet (325 mg total) by mouth daily with breakfast. 01/18/23   Rojelio Brenner M, PA-C  furosemide (LASIX) 20 MG tablet Take 1.5 tablets (30 mg total) by mouth daily. 06/08/23   [name], DO  Glucagon, rDNA, (GLUCAGON EMERGENCY) 1 MG KIT INJECT AS DIRECTED INTO  UPPER ARM, THIGH OR  BUTTOCKS AS NEEDED FOR  SEVERE HYPOGLYCEMIA. SEEK  MEDICAL ATTENTION AFTER USE Patient taking differently: Inject 1 mg into the muscle as needed (HYPOGLYCEMIA). 06/14/21   Gwenlyn Fudge, FNP  Glucosamine-Chondroit-Vit C-Mn (GLUCOSAMINE 1500 COMPLEX) CAPS Take 2 capsules by mouth daily.    [provider]  glucose blood (CONTOUR NEXT TEST) test strip Test BS QID and as needed Dx E11.9 11/08/21   Delynn Flavin M, DO  insulin aspart (NOVOLOG) 100 UNIT/ML injection Inject 2-15 Units into the skin 4 (four) times daily -  before meals and at bedtime. CBG 121 - 150: 2 units, CBG 151 - 200: 3 units, CBG 201 -  250: 5 units, CBG 251 - 300: 8 units, CBG 301 - 350: 11 units, CBG 351 - 400: 15 units, CBG > 400call MD 09/27/23   Lewie Chamber, MD  insulin aspart (NOVOLOG) 100 UNIT/ML injection Inject 8 Units into the skin 3 (three) times daily with meals. 09/27/23   Lewie Chamber, MD  insulin glargine, 2 Unit Dial, (TOUJEO MAX SOLOSTAR) 300 UNIT/ML Solostar Pen Inject 20 Units into the skin daily. 09/27/23   Lewie Chamber, MD  Magnesium 250 MG TABS Take 250 mg by mouth daily.    [provider]  metoprolol succinate (TOPROL-XL) 25 MG 24 hr tablet Take 1 tablet (25 mg total) by mouth daily. 09/28/23  Lewie Chamber, MD  Microlet Lancets MISC Test BS QID and as needed Dx E11.9 06/04/20   Delynn Flavin M, DO  montelukast (SINGULAIR) 10 MG tablet Take 1 tablet (10 mg total) by mouth at bedtime. 06/08/23   [name], DO  Multiple Vitamins-Minerals (EYE HEALTH) CAPS Take 1 capsule by mouth daily.    [provider]  pantoprazole (PROTONIX) 40 MG tablet TAKE 1 TABLET BY MOUTH TWICE  DAILY BEFORE MEALS 10/16/23   Pyrtle, Carie Caddy, MD  potassium chloride SA (KLOR-CON M) 20 MEQ tablet Take 1 tablet (20 mEq total) by mouth daily. 06/08/23   [name], DO  ramelteon (ROZEREM) 8 MG tablet Take 1 tablet (8 mg total) by mouth at bedtime. 06/08/23   [name], DO  Testosterone 30 MG/ACT SOLN Apply 1 pump under each axilla once daily. 07/21/23   [name], DO  Turmeric Curcumin 500 MG CAPS Take 500 mg by mouth daily.    [provider]  valsartan (DIOVAN) 40 MG tablet Take 1 tablet (40 mg total) by mouth daily. 06/08/23   [name], DO     Critical care time:      Signature: Rana Snare, DO Internal Medicine Resident PGY-2

## 2023-10-27 DIAGNOSIS — R579 Shock, unspecified: Secondary | ICD-10-CM | POA: Diagnosis not present

## 2023-10-27 DIAGNOSIS — I4891 Unspecified atrial fibrillation: Secondary | ICD-10-CM | POA: Diagnosis not present

## 2023-10-27 DIAGNOSIS — E1010 Type 1 diabetes mellitus with ketoacidosis without coma: Secondary | ICD-10-CM | POA: Diagnosis not present

## 2023-10-27 DIAGNOSIS — K922 Gastrointestinal hemorrhage, unspecified: Secondary | ICD-10-CM | POA: Diagnosis not present

## 2023-10-27 LAB — BASIC METABOLIC PANEL
Anion gap: 11 (ref 5–15)
BUN: 9 mg/dL (ref 8–23)
CO2: 24 mmol/L (ref 22–32)
Calcium: 8.4 mg/dL — ABNORMAL LOW (ref 8.9–10.3)
Chloride: 98 mmol/L (ref 98–111)
Creatinine, Ser: 0.82 mg/dL (ref 0.61–1.24)
GFR, Estimated: >60 mL/min (ref >60)
Glucose, Bld: 182 mg/dL — ABNORMAL HIGH (ref 70–99)
Potassium: 3.4 mmol/L — ABNORMAL LOW (ref 3.5–5.1)
Sodium: 133 mmol/L — ABNORMAL LOW (ref 135–145)

## 2023-10-27 LAB — APTT: aPTT: 121 s — ABNORMAL HIGH (ref 24–36)

## 2023-10-27 LAB — GLUCOSE, CAPILLARY
Glucose-Capillary: 210 mg/dL — ABNORMAL HIGH (ref 70–99)
Glucose-Capillary: 140 mg/dL — ABNORMAL HIGH (ref 70–99)
Glucose-Capillary: 330 mg/dL — ABNORMAL HIGH (ref 70–99)
Glucose-Capillary: 256 mg/dL — ABNORMAL HIGH (ref 70–99)

## 2023-10-27 LAB — CORTISOL-AM, BLOOD: Cortisol - AM: 8.9 ug/dL (ref 6.7–22.6)

## 2023-10-27 LAB — PHOSPHORUS: Phosphorus: 1.8 mg/dL — ABNORMAL LOW (ref 2.5–4.6)

## 2023-10-27 LAB — CBC
HCT: 28.9 % — ABNORMAL LOW (ref 39.0–52.0)
Hemoglobin: 9.9 g/dL — ABNORMAL LOW (ref 13.0–17.0)
MCH: 35 pg — ABNORMAL HIGH (ref 26.0–34.0)
MCHC: 34.3 g/dL (ref 30.0–36.0)
MCV: 102.1 fL — ABNORMAL HIGH (ref 80.0–100.0)
Platelets: 87 10*3/uL — ABNORMAL LOW (ref 150–400)
RBC: 2.83 MIL/uL — ABNORMAL LOW (ref 4.22–5.81)
RDW: 16.8 % — ABNORMAL HIGH (ref 11.5–15.5)
WBC: 4.5 10*3/uL (ref 4.0–10.5)
nRBC: 0 % (ref 0.0–0.2)

## 2023-10-27 LAB — ACTH STIMULATION, 3 TIME POINTS
Cortisol, 30 Min: 19 ug/dL
Cortisol, 60 Min: 18 ug/dL
Cortisol, Base: 12.5 ug/dL

## 2023-10-27 LAB — HEPARIN LEVEL (UNFRACTIONATED)
Heparin Unfractionated: 0.39 [IU]/mL (ref 0.30–0.70)
Heparin Unfractionated: 0.61 [IU]/mL (ref 0.30–0.70)
Heparin Unfractionated: 0.49 [IU]/mL (ref 0.30–0.70)

## 2023-10-27 LAB — MAGNESIUM: Magnesium: 1.7 mg/dL (ref 1.7–2.4)

## 2023-10-27 MED ORDER — AMIODARONE LOAD VIA INFUSION
150.0000 mg | Freq: Once | INTRAVENOUS | Status: AC
  Administered 2023-10-27: 150 mg via INTRAVENOUS
  Filled 2023-10-27: qty 83.34

## 2023-10-27 MED ORDER — POTASSIUM & SODIUM PHOSPHATES 280-160-250 MG PO PACK
2.0000 | PACK | ORAL | Status: AC
  Administered 2023-10-27 (×4): 2 via ORAL
  Filled 2023-10-27 (×4): qty 2

## 2023-10-27 MED ORDER — COSYNTROPIN 0.25 MG IJ SOLR
0.2500 mg | Freq: Once | INTRAMUSCULAR | Status: DC
Start: 2023-10-28 — End: 2023-10-27

## 2023-10-27 MED ORDER — AMIODARONE IV BOLUS ONLY 150 MG/100ML: 150.0000 mg | Freq: Once | INTRAVENOUS | Status: DC

## 2023-10-27 MED ORDER — NAPHAZOLINE-GLYCERIN 0.012-0.25 % OP SOLN
1.0000 [drp] | Freq: Four times a day (QID) | OPHTHALMIC | Status: DC | PRN
  Administered 2023-10-28: 2 [drp] via OPHTHALMIC
  Filled 2023-10-27: qty 15

## 2023-10-27 MED ORDER — INSULIN GLARGINE 100 UNIT/ML ~~LOC~~ SOLN
30.0000 [IU] | Freq: Every day | SUBCUTANEOUS | Status: DC
  Administered 2023-10-28: 30 [IU] via SUBCUTANEOUS
  Filled 2023-10-27: qty 0.3

## 2023-10-27 MED ORDER — POLYETHYLENE GLYCOL 3350 17 G PO PACK
17.0000 g | PACK | Freq: Every day | ORAL | Status: DC
  Administered 2023-10-27 – 2023-11-01 (×5): 17 g via ORAL
  Filled 2023-10-27 (×7): qty 1

## 2023-10-27 MED ORDER — MAGNESIUM SULFATE 2 GM/50ML IV SOLN
2.0000 g | Freq: Once | INTRAVENOUS | Status: AC
  Administered 2023-10-27: 2 g via INTRAVENOUS
  Filled 2023-10-27: qty 50

## 2023-10-27 MED ORDER — LACTATED RINGERS IV BOLUS
1000.0000 mL | Freq: Once | INTRAVENOUS | Status: AC
  Administered 2023-10-27: 1000 mL via INTRAVENOUS

## 2023-10-27 MED ORDER — COSYNTROPIN 0.25 MG IJ SOLR
0.2500 mg | Freq: Once | INTRAMUSCULAR | Status: AC
  Administered 2023-10-27: 0.25 mg via INTRAVENOUS
  Filled 2023-10-27: qty 0.25

## 2023-10-27 NOTE — Progress Notes (Signed)
 PHARMACY - ANTICOAGULATION CONSULT NOTE  Pharmacy Consult for heparin Indication: atrial fibrillation  Allergies  Allergen Reactions   Dimetapp Children's Cold-Cough Other (See Comments)    Chest discomfort    Erythromycin Diarrhea   Sulfonamide Derivatives Diarrhea    Patient Measurements: Weight: 84.8 kg (186 lb 15.2 oz) Heparin Dosing Weight: 84.8 kg  Vital Signs: Temp: 98.2 F (36.8 C) (02/28 1149) Temp Source: Oral (02/28 1149) BP: 96/64 (02/28 1345) Pulse Rate: 155 (02/28 1345)  Labs: Recent Labs    10/24/23 2053 10/25/23 0226 10/25/23 0659 10/25/23 1235 10/25/23 1459 10/25/23 1830 10/26/23 0335 10/26/23 0438 10/26/23 2028 10/27/23 0444 10/27/23 1100  HGB 12.5*  --  10.1*   < >  --  7.9*  --  10.7*  --  9.9*  --   HCT 37.1*  --  29.4*   < >  --  23.7*  --  31.8*  --  28.9*  --   PLT 167  --  131*  --   --   --   --   --   --  87*  --   APTT  --   --   --   --   --   --   --   --   --  121*  --   LABPROT 15.5*  --   --   --   --   --   --   --   --   --   --   INR 1.2  --   --   --   --   --   --   --   --   --   --   HEPARINUNFRC  --   --   --   --   --   --   --   --   --  0.39 0.61  CREATININE 1.83*   < >  --   --   --   --  0.92  --  0.88 0.82  --   TROPONINIHS 49*  --  61*  --  64*  --   --   --   --   --   --    < > = values in this interval not displayed.    Estimated Creatinine Clearance: 83.8 mL/min (by C-G formula based on SCr of 0.82 mg/dL).    Assessment: 49 YOM w/ PMH that includes AF on apixaban PTA. Last dose taken 2/24 at 0800 He was admitted w/ hematemasis c/g GIB. GI saw patient stating he had a recent EGD showing nonbleeding esophageal varices, nonbleeding AVM's. Out of concern for potential of active bleed, will initiate heparin infusion with lower goals. No bolus will be given.  Heparin level slightly supratherapeutic (0.61) for lower goal range. No overt bleeding noted. Hgb 9.9, plt down to 87.  Goal of Therapy:  Heparin level  0.3-0.5 units/ml Monitor platelets by anticoagulation protocol: Yes   Plan:  Decrease heparin infusion to 1000 units/hr Will f/u 8hr heparin level.  Christoper Fabian, PharmD, BCPS Please see amion for complete clinical pharmacist phone list 10/27/2023 2:00 PM

## 2023-10-27 NOTE — [place] FL2 (Signed)
 [place] MEDICAID FL2 LEVEL OF CARE FORM     IDENTIFICATION  Patient Name: Tony Cantu Birthdate: 04-21-1951 Sex: male Admission Date (Current Location): 10/24/2023  [facility] [place] Number:  Producer, television/film/video and Address:  [place], [address] [place], [place] [address]      Provider Number: [id number]  Attending Physician Name and Address:  Raechel Chute, MD  Relative Name and Phone Number:  Thao Vanover; Relative; [phone]    Current Level of Care: SNF Recommended Level of Care: Skilled Nursing Facility Prior Approval Number:    Date Approved/Denied:   PASRR Number: [id number]  Discharge Plan: SNF    Current Diagnoses: Patient Active Problem List   Diagnosis Date Noted   Atrial fibrillation with rapid ventricular response (HCC) 10/26/2023   Protein-calorie malnutrition, severe 10/25/2023   DKA (diabetic ketoacidosis) (HCC) 10/25/2023   GI bleed 10/24/2023   Hemorrhagic shock (HCC) 10/24/2023   Other cirrhosis of liver (HCC) 09/25/2023   Abnormal finding on GI tract imaging 09/25/2023   Esophageal candidiasis (HCC) 09/25/2023   Bacteremia 09/21/2023   Sepsis (HCC) 09/20/2023   Stroke-like symptom 09/20/2023   Acute encephalopathy 09/20/2023   Transaminitis 09/20/2023   Renal lesion 09/20/2023   Melena 09/20/2023   Iron deficiency anemia due to chronic blood loss 08/16/2023   NAFLD (nonalcoholic fatty liver disease) 33/29/5188   Portal hypertension (HCC) 08/24/2022   S/P total knee arthroplasty, left 06/06/2022   Degenerative arthritis of left knee 06/03/2022   Chronic obstructive pulmonary disease (HCC) 05/10/2022   Grade I diastolic dysfunction 03/11/2022   Postoperative fever 03/10/2022   S/P TKR (total knee replacement), right 03/07/2022   Osteoarthritis of right knee 03/04/2022   Precordial chest pain 04/20/2021   Former smoker 01/12/2021   PAF (paroxysmal atrial fibrillation) (HCC) 06/22/2020    Hypertension associated with type 2 diabetes mellitus (HCC) 05/17/2019   Hyperlipidemia associated with type 2 diabetes mellitus (HCC) 05/17/2019   GERD with esophagitis 05/17/2019   H/O total shoulder replacement, right 11/01/2018   Paroxysmal tachycardia (HCC) 01/11/2017   Nonrheumatic aortic valve stenosis 01/11/2017   Aortic atherosclerosis (HCC) 11/23/2016   HNP (herniated nucleus pulposus), lumbar 10/06/2016   Acute renal injury (HCC) 08/06/2016   Diarrhea 08/06/2016   Fever 08/06/2016   Hyperbilirubinemia 08/06/2016   Shock circulatory (HCC) 08/06/2016   Lactic acidosis 08/06/2016   Inguinal hernia 12/10/2015   Right groin pain 11/30/2015   Carpal tunnel syndrome on right 09/09/2015   Cervical spondylosis without myelopathy 09/09/2015   Thrombocytopenia (HCC) 07/21/2014   Bilateral carotid bruits 05/11/2014   Vitamin D deficiency 09/17/2013   BPH (benign prostatic hyperplasia) 05/22/2013   Low serum testosterone level 02/18/2013   Diabetes type 2, controlled (HCC) 01/10/2013   OSA (obstructive sleep apnea) 05/16/2012   Edema 02/21/2012   At risk for coronary artery disease 03/20/2011   Obesity 03/20/2011   Allergic rhinitis 06/22/2010   ASTHMA 06/22/2010   COUGH 06/22/2010    Orientation RESPIRATION BLADDER Height & Weight     Self, Time, Situation, Place  Normal (Room Air) Incontinent, External catheter Weight: 186 lb 15.2 oz (84.8 kg) Height:     BEHAVIORAL SYMPTOMS/MOOD NEUROLOGICAL BOWEL NUTRITION STATUS    Convulsions/Seizures (History of seizures) Continent Diet (Please see dc summary)  AMBULATORY STATUS COMMUNICATION OF NEEDS Skin   Extensive Assist Verbally Normal  Personal Care Assistance Level of Assistance  Bathing, Dressing, Feeding Bathing Assistance: Maximum assistance Feeding assistance: Maximum assistance Dressing Assistance: Maximum assistance     Functional Limitations Info  Sight Sight Info: Impaired (R and L  (Eyeglasses))        SPECIAL CARE FACTORS FREQUENCY  PT (By licensed PT), OT (By licensed OT)     PT Frequency: 5x OT Frequency: 5x            Contractures Contractures Info: Not present    Additional Factors Info  Code Status, Allergies, Insulin Sliding Scale Code Status Info: Full Code Allergies Info: Dimetapp Children's Cold-cough; Erythromycin; Sulfonamide Derivatives   Insulin Sliding Scale Info: Please see dc summary       Current Medications (10/27/2023):  This is the current hospital active medication list Current Facility-Administered Medications  Medication Dose Route Frequency Provider Last Rate Last Admin   acetaminophen (TYLENOL) tablet 650 mg  650 mg Oral Q4H PRN Rana Snare, DO       amiodarone (NEXTERONE PREMIX) 360-4.14 MG/200ML-% (1.8 mg/mL) IV infusion  30 mg/hr Intravenous Continuous Rana Snare, DO 16.67 mL/hr at 10/27/23 1000 30 mg/hr at 10/27/23 1000   atorvastatin (LIPITOR) tablet 40 mg  40 mg Oral Daily Rana Snare, DO   40 mg at 10/27/23 2956   Chlorhexidine Gluconate Cloth 2 % PADS 6 each  6 each Topical Daily Charlott Holler, MD   6 each at 10/27/23 0856   cosyntropin (CORTROSYN) injection 0.25 mg  0.25 mg Intravenous Once Rana Snare, DO       dextrose 50 % solution 0-50 mL  0-50 mL Intravenous PRN Rana Snare, DO       docusate sodium (COLACE) capsule 100 mg  100 mg Oral BID PRN Rana Snare, DO   100 mg at 10/27/23 0851   feeding supplement (GLUCERNA SHAKE) (GLUCERNA SHAKE) liquid 237 mL  237 mL Oral TID BM Raechel Chute, MD   237 mL at 10/27/23 0855   heparin ADULT infusion 100 units/mL (25000 units/248mL)  1,100 Units/hr Intravenous Continuous Reome, Earle J, RPH 11 mL/hr at 10/27/23 1000 1,100 Units/hr at 10/27/23 1000   insulin aspart (novoLOG) injection 0-20 Units  0-20 Units Subcutaneous TID WC Rana Snare, DO   7 Units at 10/27/23 0841   insulin aspart (novoLOG) injection 0-5 Units  0-5 Units Subcutaneous QHS Rana Snare, DO       insulin aspart (novoLOG) injection 4 Units  4 Units Subcutaneous TID WC Rana Snare, DO   4 Units at 10/27/23 0841   [START ON 10/28/2023] insulin glargine (LANTUS) injection 30 Units  30 Units Subcutaneous Daily Rana Snare, DO       montelukast (SINGULAIR) tablet 10 mg  10 mg Oral QHS Rana Snare, DO   10 mg at 10/26/23 2109   multivitamin with minerals tablet 1 tablet  1 tablet Oral Daily Charlott Holler, MD   1 tablet at 10/27/23 2130   norepinephrine (LEVOPHED) 4mg  in (0.016 mg/mL) premix infusion  0-40 mcg/min Intravenous Titrated Rana Snare, DO 7.5 mL/hr at 10/27/23 1000 2 mcg/min at 10/27/23 1000   pantoprazole (PROTONIX) EC tablet 40 mg  40 mg Oral BID Rana Snare, DO   40 mg at 10/27/23 8657   polyethylene glycol (MIRALAX / GLYCOLAX) packet 17 g  17 g Oral Daily PRN Rana Snare, DO   17 g at 10/26/23 0850   potassium & sodium phosphates (PHOS-NAK) 280-160-250 MG packet 2 packet  2 packet Oral Q4H  Rana Snare, DO   2 packet at 10/27/23 0865   sodium chloride flush (NS) 0.9 % injection 10-40 mL  10-40 mL Intracatheter Q12H Charlott Holler, MD   10 mL at 10/26/23 0856   sodium chloride flush (NS) 0.9 % injection 10-40 mL  10-40 mL Intracatheter PRN Charlott Holler, MD       sodium chloride flush (NS) 0.9 % injection 10-40 mL  10-40 mL Intracatheter Q12H Charlott Holler, MD   10 mL at 10/26/23 2109   sodium chloride flush (NS) 0.9 % injection 10-40 mL  10-40 mL Intracatheter PRN Charlott Holler, MD         Discharge Medications: Please see discharge summary for a list of discharge medications.  Relevant Imaging Results:  Relevant Lab Results:   Additional Information [id number]  Marliss Coots, LCSW

## 2023-10-27 NOTE — Progress Notes (Signed)
 PHARMACY - ANTICOAGULATION CONSULT NOTE  Pharmacy Consult for heparin Indication:  Afib in setting of GIB  Labs: Recent Labs    10/24/23 2053 10/25/23 0226 10/25/23 0659 10/25/23 1235 10/25/23 1459 10/25/23 1830 10/26/23 0335 10/26/23 0438 10/26/23 2028 10/27/23 0444  HGB 12.5*  --  10.1*   < >  --  7.9*  --  10.7*  --  9.9*  HCT 37.1*  --  29.4*   < >  --  23.7*  --  31.8*  --  28.9*  PLT 167  --  131*  --   --   --   --   --   --  87*  APTT  --   --   --   --   --   --   --   --   --  121*  LABPROT 15.5*  --   --   --   --   --   --   --   --   --   INR 1.2  --   --   --   --   --   --   --   --   --   HEPARINUNFRC  --   --   --   --   --   --   --   --   --  0.39  CREATININE 1.83*   < >  --   --   --   --  0.92  --  0.88 0.82  TROPONINIHS 49*  --  61*  --  64*  --   --   --   --   --    < > = values in this interval not displayed.   Assessment/Plan:  73yo male therapeutic on heparin with initial dosing while DOAC on hold. Will continue infusion at current rate of 1100 units/hr and confirm stable with additional level.  Vernard Gambles, PharmD, BCPS 10/27/2023 6:22 AM

## 2023-10-27 NOTE — TOC Progression Note (Addendum)
 Transition of Care [facility]) - Progression Note    Patient Details  Name: Tony Cantu MRN: [id number] Date of Birth: 1950/09/19  Transition of [facility]) CM/SW Contact  Marliss Coots, LCSW Phone Number: 10/27/2023, 11:33 AM  Clinical Narrative:     11:33 AM CSW introduced herself and role to patient at bedside. CSW followed up with patient on SNF return. Patient stated that he would discharge to SNFs in [facility] but not return to [facility] where he is from. Patient consented to submit referrals to SNFs (besides [facility]) in [place] and relay discharge plan to relative, [name]. CSW set out referrals and informed [name] of discharge plan. [name] expressed patient in patient not to admit to [facility] upon discharge.  Expected Discharge Plan: Skilled Nursing Facility Barriers to Discharge: SNF Pending bed offer  Expected Discharge Plan and Services     Post Acute Care Choice: Skilled Nursing Facility Living arrangements for the past 2 months: Single Family Home, Skilled Nursing Facility                                       Social Determinants of Health (SDOH) Interventions SDOH Screenings   Food Insecurity: Patient Unable To Answer (09/20/2023)  Housing: Low Risk  (09/22/2023)  Transportation Needs: Patient Unable To Answer (09/20/2023)  Utilities: Patient Unable To Answer (09/20/2023)  Depression (PHQ2-9): Medium Risk (09/13/2023)  Financial Resource Strain: Low Risk  (06/09/2022)  Social Connections: Patient Unable To Answer (09/20/2023)  Tobacco Use: Medium Risk (10/23/2023)   Received from [facility]    Readmission Risk Interventions     No data to display

## 2023-10-27 NOTE — Progress Notes (Signed)
 PHARMACY - ANTICOAGULATION CONSULT NOTE  Pharmacy Consult for heparin Indication: atrial fibrillation  Allergies  Allergen Reactions   Dimetapp Children's Cold-Cough Other (See Comments)    Chest discomfort    Erythromycin Diarrhea   Sulfonamide Derivatives Diarrhea    Patient Measurements: Weight: 84.8 kg (186 lb 15.2 oz) Heparin Dosing Weight: 84.8 kg  Vital Signs: Temp: 98.5 F (36.9 C) (02/28 1951) Temp Source: Oral (02/28 1951) BP: 102/56 (02/28 2230) Pulse Rate: 76 (02/28 2245)  Labs: Recent Labs    10/25/23 0659 10/25/23 1235 10/25/23 1459 10/25/23 1830 10/26/23 0335 10/26/23 0438 10/26/23 2028 10/27/23 0444 10/27/23 1100 10/27/23 2136  HGB 10.1*   < >  --  7.9*  --  10.7*  --  9.9*  --   --   HCT 29.4*   < >  --  23.7*  --  31.8*  --  28.9*  --   --   PLT 131*  --   --   --   --   --   --  87*  --   --   APTT  --   --   --   --   --   --   --  121*  --   --   HEPARINUNFRC  --   --   --   --   --   --   --  0.39 0.61 0.49  CREATININE  --   --   --   --  0.92  --  0.88 0.82  --   --   TROPONINIHS 61*  --  64*  --   --   --   --   --   --   --    < > = values in this interval not displayed.    Estimated Creatinine Clearance: 83.8 mL/min (by C-G formula based on SCr of 0.82 mg/dL).    Assessment: 26 YOM w/ PMH that includes AF on apixaban PTA. Last dose taken 2/24 at 0800 He was admitted w/ hematemasis c/g GIB. GI saw patient stating he had a recent EGD showing nonbleeding esophageal varices, nonbleeding AVM's. Out of concern for potential of active bleed, will initiate heparin infusion with lower goals. No bolus will be given.  HL 0.49 - therapeutic, on high end  Goal of Therapy:  Heparin level 0.3-0.5 units/ml Monitor platelets by anticoagulation protocol: Yes   Plan:  Decrease heparin infusion slight to 950 units/hr Daily HL, CBC  Calton Dach, PharmD, BCCCP Clinical Pharmacist 10/27/2023 10:46 PM

## 2023-10-27 NOTE — Plan of Care (Signed)
  Problem: Health Behavior/Discharge Planning: Goal: Ability to manage health-related needs will improve Outcome: Progressing   Problem: Activity: Goal: Risk for activity intolerance will decrease Outcome: Progressing   Problem: Nutrition: Goal: Adequate nutrition will be maintained Outcome: Progressing   Problem: Coping: Goal: Level of anxiety will decrease Outcome: Progressing   Problem: Pain Managment: Goal: General experience of comfort will improve and/or be controlled Outcome: Progressing   Problem: Safety: Goal: Ability to remain free from injury will improve Outcome: Progressing   Problem: Coping: Goal: Ability to adjust to condition or change in health will improve Outcome: Progressing

## 2023-10-27 NOTE — Progress Notes (Signed)
 OT Cancellation Note  Patient Details Name: Tony Cantu MRN: [id number] DOB: 1950/12/03   Cancelled Treatment:    Reason Eval/Treat Not Completed: Medical issues which prohibited therapy OT talking to RN, patient now with uncontrolled A-fib (ranging from 140s to high 160s at rest). OT will follow back to complete evaluation as time permits.   Pollyann Glen E. Yulieth Carrender, OTR/L Acute Rehabilitation Services [phone]    Cherlyn Cushing 10/27/2023, 2:30 PM

## 2023-10-27 NOTE — Progress Notes (Signed)
 NAME:  Tony Cantu, MRN:  [id number], DOB:  29-Jul-1951, LOS: 3 ADMISSION DATE:  10/24/2023, CONSULTATION DATE:  10/23/2023 REFERRING MD:  Gwenyth Bouillon, CHIEF COMPLAINT:  syncope    History of Present Illness:  This is a 73 y.o. male with hx of type 1 diabetes, A-fib on Eliquis, cirrhosis, seizure disorder, aortic stenosis, hypertension, peptic ulcer disease, OA with bilateral knee replacement, polyclonal gammopathy, OSA on CPAP brought in by EMS from [facility] to [facility] ED [facility] ED) on 2/24 for syncope. Per [facility] note: The ED provider had called [facility] rehab who notified that the patient had a syncopal episode that was not witnessed. Patient was found in his wheelchair, no fall. Patient was unresponsive for 10 to 15 minutes with low blood pressure.  He was brought to the ED with altered mental status and lethargic.  Initial vitals showed blood pressure of 67/41, bradycardia 55, afebrile.  Labs were notable for creatinine 2.27, troponin 67, lactate 6.8. BNP 639.  CT CAP showed findings of mild fecal impaction with possible sterile coral colitis.  Rectal exam notable for frank melena, no overt bleed with guaiac positive.  Patient remained hypotensive even after fluid resuscitation, he was started on Levophed drip.  EDP consulted general surgery who recommended no surgical evaluation at this time.  EDP also consulted GI at [facility] health who recommended holding blood thinners and plan to see him in the morning.  Patient is brought to [facility] today 2/25, evaluated at bedside. Poor historian but oriented to self and place. Reports about 2 weeks of generalized weakness and fatigue. Some lightheadedness but no dizziness. Endorse cough with notably blood. Endorses melanotic stools. Denies chest pain, SOB, abdominal pain, n/v. Poor appetite for past few weeks.  Of note: 01/29 recently discharged from [facility] for Sepsis Secondary Streptococcus Salivarius Bacteremia and esophogeal candidiasis with PICC line  to complete 4 weeks of IV ceftriaxone with end of treatment date 10/18/2023 and 3 weeks of p.o. Fluconazole.   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   Significant Hospital Events: Including procedures, antibiotic start and stop dates in addition to other pertinent events   2/25 transfer from [facility] for hematemesis c/f GI bleed and hypotensive needing levophed 2/26 started on precedex overnight due to agitation, GI signed off 2/27 Afib RVR on EKG, started amio infusion, converted to sinus   Interim History / Subjective:  Alert and oriented. Denies any new concerns. No chest or abdominal pain. No signs of bleeding.   Objective   Blood pressure (!) 97/55, pulse 68, temperature 98.2 F (36.8 C), temperature source Oral, resp. rate 17, weight 84.8 kg, SpO2 95%.        Intake/Output Summary (Last 24 hours) at 10/27/2023 0641 Last data filed at 10/27/2023 0600 Gross per 24 hour  Intake 3030.25 ml  Output 3600 ml  Net -569.75 ml   Filed Weights   10/24/23 1709 10/26/23 0500 10/27/23 0500  Weight: 81.3 kg 84.8 kg 84.8 kg    Examination: General: awake, laying in bed comfortably, in no acute distress HENT: /AT Lungs: normal effort on room air, CTAB Cardiovascular: RRR, systolic murmur at RUSB Abdomen: bowel sounds present, soft, non-tender, non-distended Extremities: no LE edema Neuro: alert and oriented x 3, moves all extremities   Resolved Hospital  Problem list   Lactic acidosis   Assessment & Plan:  Shock, likely hypovolemic and DKA (initially c/f UGIB) Syncope Hx of hematemesis and melena c/f UGIB Hx of Esophageal candidiasis Hx of Portal hypertensive gastropathy Hx of gastric antral ectasia w/o  bleed Hx of Cirrhosis  Reported with 2 weeks of generalized weakness and black tarry stools. 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 He was discharged on 1/29 with PICC line to complete 4 weeks of IV ceftriaxone with EOT 10/18/2023 and 3 weeks of p.o. Fluconazole. CXR unremarkable on admission. UA from OSH unremarkable.  - Afebrile and no leukocytosis - DKA resolved, lactic acidosis resolved - GI has signed off, no plans for endoscopy - stopped octreotide and Rocephin for SBP ppx (no hx of varices)  - Protonix 40 mg BID - Levophed for MAP goal >65, wean as tolerated  DKA, resolved T1DM Recent A1c 4.4. Elevated AG >15, BG 469. BHB elevated at admission. Started on Endotool. AG closed x 2, BHB trended down, transitioned to SQ insulin. - Fasting CBG 182, yesterday CBG in 300s - increase glargine to 30 units daily - novolog 4 units TID AC - SSI-R   Anemia, Macrocytic  Hx of IDA - Iron studies unremarkable  - Hgb stable ~10, admit was 12.1 (appears BL 10-11) - trend CBC, monitor for signs of bleed  AKI, resolved Admit Scr 2.07 with baseline 0.8-0.9. Improved to baseline s/p IVF. - trend BMP - avoid nephrotoxic agents  - strict Is & Os  Hx of Streptococcus salivarius bacteremia  Recent hospitalization. Repeat Bcx 1/22 negative, was to complete IV abx via PICC with EOT 2/19. - [facility] collected Bcx x 2 on 2/24 - No fevers or leukocytosis on admission, remains afebrile and normal WBC - Repeat Bcx here, so far no growth   Afib with RVR on Eliquis Severe aortic stenosis  - Hold home Dilt 30 mg BID and Toprol XL 25 mg daily  - 2/27 noted to be in Afib RVR, started amio  - converted to sinus rhythm, continue amio drip  - on heparin drip now, Hgb has been stable no signs of bleed, consider transition to DOAC tomorrow   OSA  Hypoglossal Nerve Stimulator - Has R nerve stimulator in  place, no signs of overlying infection of area - CPAP at bedtime   Hx Seizure  - No dispense history of Keppra per chart review.   Best Practice (right click and "Reselect all SmartList Selections" daily)   Diet/type: Regular consistency (see orders) DVT prophylaxis systemic heparin Pressure ulcer(s): Elevated by RN GI prophylaxis: PPI Lines: PICC  Foley:  N/A Code Status:  full code Last date of multidisciplinary goals of care discussion [emergency contact updated 2/27 ]  Labs   CBC: Recent Labs  Lab 10/24/23 1744 10/24/23 2053 10/25/23 0659 10/25/23 1235 10/25/23 1830 10/26/23 0438 10/27/23 0444  WBC 8.6 8.3 6.9  --   --   --  4.5  HGB 12.1* 12.5* 10.1* 9.4* 7.9* 10.7* 9.9*  HCT 36.0* 37.1* 29.4* 27.9* 23.7* 31.8* 28.9*  MCV 104.0* 107.2* 103.2*  --   --   --  102.1*  PLT 162 167 131*  --   --   --  87*    Basic Metabolic Panel: Recent Labs  Lab 10/25/23 0226 10/25/23 0524 10/25/23 1235 10/25/23 1530 10/26/23 0335 10/26/23 2028 10/27/23 0444  NA 140 138  --   --  137 133* 133*  K 3.5 3.2*  --   --  3.8 3.2* 3.4*  CL 106 108  --   --  103 98 98  CO2 24 25  --   --  24 26 24   GLUCOSE 168* 168*  --   --  229* 186* 182*  BUN 22 18  --   --  11 9 9   CREATININE 1.38* 1.14  --   --  0.92 0.88 0.82  CALCIUM 9.8 9.5  --   --  8.7* 8.2* 8.4*  MG  --   --  1.1*  --  1.6*  --  1.7  PHOS  --   --   --  2.1* 1.8*  --  1.8*   GFR: Estimated Creatinine Clearance: 83.8 mL/min (by C-G formula based on SCr of 0.82 mg/dL). Recent Labs  Lab 10/24/23 1744 10/24/23 2053 10/25/23 0510 10/25/23 0659 10/25/23 1230 10/26/23 0910 10/26/23 1110 10/26/23 2028 10/26/23 2254 10/27/23 0444  PROCALCITON  --   --   --  0.18  --   --   --   --   --   --   WBC 8.6 8.3  --  6.9  --   --   --   --   --  4.5  LATICACIDVEN 3.8* 3.9*   < >  --    < > 2.8* 3.2* 2.6* 1.9  --    < > = values in this interval not displayed.    Liver Function Tests: Recent Labs  Lab 10/24/23 1744   AST 42*  ALT 40  ALKPHOS 123  BILITOT 1.5*  PROT 6.8  ALBUMIN 2.5*   No results for input(s): "LIPASE", "AMYLASE" in the last 168 hours. Recent Labs  Lab 10/25/23 1420  AMMONIA 32    ABG    Component Value Date/Time   HCO3 21.7 09/20/2023 0532   ACIDBASEDEF 2.5 (H) 09/20/2023 0532   O2SAT 61.6 09/20/2023 0532     Coagulation Profile: Recent Labs  Lab 10/24/23 2053  INR 1.2    Cardiac Enzymes: No results for input(s): "CKTOTAL", "CKMB", "CKMBINDEX", "TROPONINI" in the last 168 hours.  HbA1C: HB A1C (BAYER DCA - WAIVED)  Date/Time Value Ref Range Status  09/13/2023 11:08 AM 4.6 (L) 4.8 - 5.6 % Final    Comment:             Prediabetes: 5.7 - 6.4          Diabetes: >6.4          Glycemic control for adults with diabetes: <7.0   04/26/2023 10:47 AM 4.2 (L) 4.8 - 5.6 % Final    Comment:             Prediabetes: 5.7 - 6.4          Diabetes: >6.4          Glycemic control for adults with diabetes: <7.0    Hgb A1c MFr Bld  Date/Time Value Ref Range Status  09/19/2023 06:48 PM 4.4 (L) 4.8 - 5.6 % Final    Comment:    (NOTE) Pre diabetes:          5.7%-6.4%  Diabetes:              >6.4%  Glycemic control for   <7.0% adults with diabetes     CBG: Recent Labs  Lab 10/26/23 0351 10/26/23 0850 10/26/23 1150 10/26/23 1609 10/26/23 2248  GLUCAP 243* 341* 316* 306* 196*  Review of Systems:   As per HPI   Past Medical History:  He,  has a past medical history of Aortic atherosclerosis (HCC), Aortic stenosis, Arthritis, Asthma, BPH (benign prostatic hypertrophy), Cirrhosis (HCC) (2016), Colon polyps, Diabetes mellitus without complication (HCC), Diverticulosis, Dysrhythmia (04/19/2021), Gastric ulcer, Gastric varices, Gastritis, GERD (gastroesophageal reflux disease), Headache, Heart murmur, History of kidney stones, adenomatous colonic polyps, Hypertension, Iron deficiency anemia due to chronic blood loss (08/16/2023), Portal hypertensive gastropathy  (HCC), Renal cyst, right, Seizures (HCC), Sleep apnea, Testicle trouble, and Tubular adenoma of colon.   Surgical History:   Past Surgical History:  Procedure Laterality Date   BACK SURGERY     89  LOWER    CARPAL TUNNEL RELEASE Right 10/13/2015   Procedure: RIGHT CARPAL TUNNEL RELEASE;  Surgeon: Cindee Salt, MD;  Location: [facility];  Service: Orthopedics;  Laterality: Right;   CARPAL TUNNEL RELEASE Left 07/05/2016   Procedure: LEFT CARPAL TUNNEL RELEASE;  Surgeon: Cindee Salt, MD;  Location: [facility];  Service: Orthopedics;  Laterality: Left;   COLONOSCOPY WITH ESOPHAGOGASTRODUODENOSCOPY (EGD)  10/05/2022   DRUG INDUCED ENDOSCOPY N/A 07/02/2021   Procedure: DRUG INDUCED SLEEP ENDOSCOPY;  Surgeon: Christia Reading, MD;  Location: [facility] OR;  Service: ENT;  Laterality: N/A;   ESOPHAGOGASTRODUODENOSCOPY (EGD) WITH PROPOFOL N/A 09/25/2023   Procedure: ESOPHAGOGASTRODUODENOSCOPY (EGD) WITH PROPOFOL;  Surgeon: Sherrilyn Rist, MD;  Location: [facility];  Service: Gastroenterology;  Laterality: N/A;  EGD needs to be coordinated with TEE.  Patient has severe aortic stenosis, procedures need to be done to follow each other with 1 sedation   IMPLANTATION OF HYPOGLOSSAL NERVE STIMULATOR Right 10/01/2021   Procedure: IMPLANTATION OF HYPOGLOSSAL NERVE STIMULATOR;  Surgeon: Christia Reading, MD;  Location: [facility] OR;  Service: ENT;  Laterality: Right;   INGUINAL HERNIA REPAIR  2003   right    KNEE ARTHROSCOPY Right 03/03/2016   LUMBAR DISC SURGERY  03/1995   Dr. Daleen Squibb, discectomy   LUMBAR LAMINECTOMY/DECOMPRESSION MICRODISCECTOMY Right 10/06/2016   Procedure: RIGHT LUMBAR THREE - LUMBAR FOUR  LAMINECTOMY, FORAMINOTOMY AND MICRODISCECTOMY;  Surgeon: Shirlean Kelly, MD;  Location: [facility] OR;  Service: Neurosurgery;  Laterality: Right;   SHOULDER SURGERY  11/28/2005   left partial   SHOULDER SURGERY  07/14/2006   RIGHT   TEE WITHOUT CARDIOVERSION N/A 09/25/2023   Procedure:  TRANSESOPHAGEAL ECHOCARDIOGRAM (TEE);  Surgeon: Dolores Patty, MD;  Location: [facility] END[facility]COPY;  Service: Cardiovascular;  Laterality: N/A;   TONSILLECTOMY  AGE 67 OR 4   TOTAL KNEE ARTHROPLASTY Right 03/07/2022   Procedure: RIGHT TOTAL KNEE ARTHROPLASTY;  Surgeon: Gean Birchwood, MD;  Location: [facility];  Service: Orthopedics;  Laterality: Right;   TOTAL KNEE ARTHROPLASTY Left 06/06/2022   Procedure: LEFT TOTAL KNEE ARTHROPLASTY;  Surgeon: Gean Birchwood, MD;  Location: [facility];  Service: Orthopedics;  Laterality: Left;   TOTAL SHOULDER ARTHROPLASTY Right 11/01/2018   Procedure: RIGHT SHOULDER REVISION TO REVERSE TOTAL SHOULDER;  Surgeon: Jones Broom, MD;  Location: [facility];  Service: Orthopedics;  Laterality: Right;  CHOICE ANESTHESIA WITH INTERSCALENE BLOCK EXPAREL, NEEDS RNFA     Social History:   reports that he quit smoking about 20 years ago. His smoking use included cigarettes and pipe. He started smoking about 54 years ago. He has never used smokeless tobacco. He reports that he does not currently use alcohol. He reports that he does not use drugs.   Family History:  His family history includes Allergic rhinitis in his sister; Alzheimer's disease in  his mother; Asthma in his father; Breast cancer in his maternal grandmother; Diabetes in his mother; Heart attack in his maternal grandmother and mother; Heart failure in his maternal grandmother and mother; Rheum arthritis in his maternal grandmother; Suicidality in his father. There is no history of Colon cancer, Esophageal cancer, Pancreatic cancer, or Liver disease.   Allergies Allergies  Allergen Reactions   Dimetapp Children's Cold-Cough Other (See Comments)    Chest discomfort    Erythromycin Diarrhea   Sulfonamide Derivatives Diarrhea     Home Medications  Prior to Admission medications   Medication Sig Start Date End Date Taking? Authorizing Provider  acetaminophen (TYLENOL) 500 MG tablet Take 1,000 mg by mouth every 6 (six)  hours as needed for moderate pain.    [provider]  apixaban (ELIQUIS) 5 MG TABS tablet Take 1 tablet (5 mg total) by mouth 2 (two) times daily. 06/08/23   [name], DO  atorvastatin (LIPITOR) 40 MG tablet Take 1 tablet (40 mg total) by mouth daily. 06/08/23   [name], DO  b complex vitamins tablet Take 1 tablet by mouth daily.    [provider]  Blood Glucose Monitoring Suppl (CONTOUR NEXT ONE) KIT Test BS QID and as needed Dx E11.9 06/04/20   Delynn Flavin M, DO  cefTRIAXone (ROCEPHIN) IVPB Inject 2 g into the vein daily. Indication:  10/18/23 First Dose: Yes Last Day of Therapy:  10/18/23 Labs - Once weekly:  CBC/D and BMP, Labs - Once weekly: ESR and CRP 09/27/23   Lewie Chamber, MD  Cholecalciferol (DIALYVITE VITAMIN D 5000) 125 MCG (5000 UT) capsule Take 5,000 Units by mouth daily.    [provider]  Chromium Picolinate (CHROMIUM PICOLATE PO) Take 1 tablet by mouth daily.    [provider]  CINNAMON PO Take 1,080 mg by mouth daily.    [provider]  Coenzyme Q10 (COQ10) 100 MG CAPS Take 100 mg by mouth daily.    [provider]  Continuous Blood Gluc Sensor (FREESTYLE LIBRE SENSOR SYSTEM) MISC Check BS eight (8) times a day. Dx E10.9 06/29/22   Delynn Flavin M, DO  diltiazem (CARDIZEM) 30 MG tablet Take 1 tablet (30 mg total) by mouth 2 (two) times daily. Patient must schedule annual office visit for further refills 06/26/23   Rollene Rotunda, MD  ferrous sulfate 325 (65 FE) MG EC tablet Take 1 tablet (325 mg total) by mouth daily with breakfast. 01/18/23   Rojelio Brenner M, PA-C  furosemide (LASIX) 20 MG tablet Take 1.5 tablets (30 mg total) by mouth daily. 06/08/23   [name], DO  Glucagon, rDNA, (GLUCAGON EMERGENCY) 1 MG KIT INJECT AS DIRECTED INTO  UPPER ARM, THIGH OR  BUTTOCKS AS NEEDED FOR  SEVERE HYPOGLYCEMIA. SEEK  MEDICAL ATTENTION AFTER USE Patient taking differently: Inject 1  mg into the muscle as needed (HYPOGLYCEMIA). 06/14/21   Gwenlyn Fudge, FNP  Glucosamine-Chondroit-Vit C-Mn (GLUCOSAMINE 1500 COMPLEX) CAPS Take 2 capsules by mouth daily.    [provider]  glucose blood (CONTOUR NEXT TEST) test strip Test BS QID and as needed Dx E11.9 11/08/21   Delynn Flavin M, DO  insulin aspart (NOVOLOG) 100 UNIT/ML injection Inject 2-15 Units into the skin 4 (four) times daily -  before meals and at bedtime. CBG 121 - 150: 2 units, CBG 151 - 200: 3 units, CBG 201 - 250: 5 units, CBG 251 - 300: 8 units, CBG 301 - 350: 11 units, CBG 351 -  400: 15 units, CBG > 400call MD 09/27/23   Lewie Chamber, MD  insulin aspart (NOVOLOG) 100 UNIT/ML injection Inject 8 Units into the skin 3 (three) times daily with meals. 09/27/23   Lewie Chamber, MD  insulin glargine, 2 Unit Dial, (TOUJEO MAX SOLOSTAR) 300 UNIT/ML Solostar Pen Inject 20 Units into the skin daily. 09/27/23   Lewie Chamber, MD  Magnesium 250 MG TABS Take 250 mg by mouth daily.    [provider]  metoprolol succinate (TOPROL-XL) 25 MG 24 hr tablet Take 1 tablet (25 mg total) by mouth daily. 09/28/23   Lewie Chamber, MD  Microlet Lancets MISC Test BS QID and as needed Dx E11.9 06/04/20   Delynn Flavin M, DO  montelukast (SINGULAIR) 10 MG tablet Take 1 tablet (10 mg total) by mouth at bedtime. 06/08/23   [name], DO  Multiple Vitamins-Minerals (EYE HEALTH) CAPS Take 1 capsule by mouth daily.    [provider]  pantoprazole (PROTONIX) 40 MG tablet TAKE 1 TABLET BY MOUTH TWICE  DAILY BEFORE MEALS 10/16/23   Pyrtle, Carie Caddy, MD  potassium chloride SA (KLOR-CON M) 20 MEQ tablet Take 1 tablet (20 mEq total) by mouth daily. 06/08/23   [name], DO  ramelteon (ROZEREM) 8 MG tablet Take 1 tablet (8 mg total) by mouth at bedtime. 06/08/23   [name], DO  Testosterone 30 MG/ACT SOLN Apply 1 pump under each axilla once daily. 07/21/23   [name], DO  Turmeric  Curcumin 500 MG CAPS Take 500 mg by mouth daily.    [provider]  valsartan (DIOVAN) 40 MG tablet Take 1 tablet (40 mg total) by mouth daily. 06/08/23   [name], DO     Critical care time:      Signature: Rana Snare, DO Internal Medicine Resident PGY-2

## 2023-10-28 DIAGNOSIS — R579 Shock, unspecified: Secondary | ICD-10-CM | POA: Diagnosis not present

## 2023-10-28 DIAGNOSIS — I4891 Unspecified atrial fibrillation: Secondary | ICD-10-CM | POA: Diagnosis not present

## 2023-10-28 DIAGNOSIS — E1010 Type 1 diabetes mellitus with ketoacidosis without coma: Secondary | ICD-10-CM | POA: Diagnosis not present

## 2023-10-28 DIAGNOSIS — N179 Acute kidney failure, unspecified: Secondary | ICD-10-CM | POA: Diagnosis not present

## 2023-10-28 LAB — CBC
HCT: 29.4 % — ABNORMAL LOW (ref 39.0–52.0)
Hemoglobin: 10 g/dL — ABNORMAL LOW (ref 13.0–17.0)
MCH: 34.8 pg — ABNORMAL HIGH (ref 26.0–34.0)
MCHC: 34 g/dL (ref 30.0–36.0)
MCV: 102.4 fL — ABNORMAL HIGH (ref 80.0–100.0)
Platelets: 89 10*3/uL — ABNORMAL LOW (ref 150–400)
RBC: 2.87 MIL/uL — ABNORMAL LOW (ref 4.22–5.81)
RDW: 16.8 % — ABNORMAL HIGH (ref 11.5–15.5)
WBC: 4.2 10*3/uL (ref 4.0–10.5)
nRBC: 0 % (ref 0.0–0.2)

## 2023-10-28 LAB — HEPARIN LEVEL (UNFRACTIONATED): Heparin Unfractionated: 0.5 [IU]/mL (ref 0.30–0.70)

## 2023-10-28 LAB — GLUCOSE, CAPILLARY
Glucose-Capillary: 230 mg/dL — ABNORMAL HIGH (ref 70–99)
Glucose-Capillary: 207 mg/dL — ABNORMAL HIGH (ref 70–99)

## 2023-10-28 LAB — BASIC METABOLIC PANEL
Anion gap: 6 (ref 5–15)
BUN: 9 mg/dL (ref 8–23)
CO2: 27 mmol/L (ref 22–32)
Calcium: 8.2 mg/dL — ABNORMAL LOW (ref 8.9–10.3)
Chloride: 101 mmol/L (ref 98–111)
Creatinine, Ser: 0.78 mg/dL (ref 0.61–1.24)
GFR, Estimated: >60 mL/min (ref >60)
Glucose, Bld: 192 mg/dL — ABNORMAL HIGH (ref 70–99)
Potassium: 3.4 mmol/L — ABNORMAL LOW (ref 3.5–5.1)
Sodium: 134 mmol/L — ABNORMAL LOW (ref 135–145)

## 2023-10-28 LAB — MAGNESIUM: Magnesium: 1.7 mg/dL (ref 1.7–2.4)

## 2023-10-28 LAB — PHOSPHORUS: Phosphorus: 2.2 mg/dL — ABNORMAL LOW (ref 2.5–4.6)

## 2023-10-28 MED ORDER — POTASSIUM & SODIUM PHOSPHATES 280-160-250 MG PO PACK
2.0000 | PACK | ORAL | Status: AC
  Administered 2023-10-28 (×3): 2 via ORAL
  Filled 2023-10-28 (×3): qty 2

## 2023-10-28 MED ORDER — INSULIN ASPART 100 UNIT/ML IJ SOLN
8.0000 [IU] | Freq: Three times a day (TID) | INTRAMUSCULAR | Status: DC
  Administered 2023-10-28 – 2023-10-29 (×3): 8 [IU] via SUBCUTANEOUS

## 2023-10-28 MED ORDER — LACTATED RINGERS IV BOLUS
500.0000 mL | Freq: Once | INTRAVENOUS | Status: AC
  Administered 2023-10-28: 500 mL via INTRAVENOUS

## 2023-10-28 MED ORDER — INSULIN GLARGINE 100 UNIT/ML ~~LOC~~ SOLN
35.0000 [IU] | Freq: Every day | SUBCUTANEOUS | Status: AC
  Administered 2023-10-29: 35 [IU] via SUBCUTANEOUS
  Filled 2023-10-28: qty 0.35

## 2023-10-28 MED ORDER — INSULIN GLARGINE 100 UNIT/ML ~~LOC~~ SOLN
5.0000 [IU] | Freq: Once | SUBCUTANEOUS | Status: AC
  Administered 2023-10-28: 5 [IU] via SUBCUTANEOUS
  Filled 2023-10-28: qty 0.05

## 2023-10-28 MED ORDER — MIDODRINE HCL 5 MG PO TABS
10.0000 mg | ORAL_TABLET | Freq: Three times a day (TID) | ORAL | Status: DC
  Administered 2023-10-28: 10 mg via ORAL
  Filled 2023-10-28: qty 2

## 2023-10-28 MED ORDER — DOCUSATE SODIUM 100 MG PO CAPS
100.0000 mg | ORAL_CAPSULE | Freq: Two times a day (BID) | ORAL | Status: DC
  Administered 2023-10-28 – 2023-11-03 (×11): 100 mg via ORAL
  Filled 2023-10-28 (×13): qty 1

## 2023-10-28 MED ORDER — MAGNESIUM SULFATE 2 GM/50ML IV SOLN
2.0000 g | Freq: Once | INTRAVENOUS | Status: AC
  Administered 2023-10-28: 2 g via INTRAVENOUS
  Filled 2023-10-28: qty 50

## 2023-10-28 NOTE — Plan of Care (Signed)
  Problem: Health Behavior/Discharge Planning: Goal: Ability to manage health-related needs will improve Outcome: Progressing   Problem: Activity: Goal: Risk for activity intolerance will decrease Outcome: Progressing   Problem: Coping: Goal: Level of anxiety will decrease Outcome: Progressing   Problem: Safety: Goal: Ability to remain free from injury will improve Outcome: Progressing   Problem: Coping: Goal: Ability to adjust to condition or change in health will improve Outcome: Progressing

## 2023-10-28 NOTE — Progress Notes (Signed)
 PHARMACY - ANTICOAGULATION CONSULT NOTE  Pharmacy Consult for heparin Indication: atrial fibrillation  Allergies  Allergen Reactions   Dimetapp Children's Cold-Cough Other (See Comments)    Chest discomfort    Erythromycin Diarrhea   Sulfonamide Derivatives Diarrhea    Patient Measurements: Weight: 84.8 kg (186 lb 15.2 oz) Heparin Dosing Weight: 84.8 kg  Vital Signs: Temp: 98.3 F (36.8 C) (03/01 0359) Temp Source: Oral (03/01 0359) BP: 97/62 (03/01 0630) Pulse Rate: 70 (03/01 0645)  Labs: Recent Labs    10/25/23 1459 10/25/23 1830 10/26/23 0438 10/26/23 0438 10/26/23 2028 10/27/23 0444 10/27/23 1100 10/27/23 2136 10/28/23 0436  HGB  --    < > 10.7*  --   --  9.9*  --   --  10.0*  HCT  --    < > 31.8*  --   --  28.9*  --   --  29.4*  PLT  --   --   --   --   --  87*  --   --  89*  APTT  --   --   --   --   --  121*  --   --   --   HEPARINUNFRC  --   --   --    < >  --  0.39 0.61 0.49 0.50  CREATININE  --    < >  --   --  0.88 0.82  --   --  0.78  TROPONINIHS 64*  --   --   --   --   --   --   --   --    < > = values in this interval not displayed.    Estimated Creatinine Clearance: 85.9 mL/min (by C-G formula based on SCr of 0.78 mg/dL).    Assessment: 20 YOM w/ PMH that includes AF on apixaban PTA. Last dose taken 2/24 at 0800 He was admitted w/ hematemasis c/g GIB. GI saw patient stating he had a recent EGD showing nonbleeding esophageal varices, nonbleeding AVM's. Out of concern for potential of active bleed, will initiate heparin infusion with lower goals. No bolus will be given.  HL 0.50 - therapeutic, on high end. Hb stable at 10.0, platelets in 80's.  Platelet drop corresponds with all cell lines decreasing (WBC 8.3 >> 4.2, Hb 12.5 >> 10.0) and Scr 1.83 >> 0.78. Patient has received several fluid boluses during this time. Will continue to watch platelets closely.   Goal of Therapy:  Heparin level 0.3-0.5 units/ml Monitor platelets by anticoagulation  protocol: Yes   Plan:  Decrease heparin infusion slightly to 900 units/hr Daily HL, CBC  Cedric Fishman, PharmD, BCPS, BCCCP Clinical Pharmacist

## 2023-10-28 NOTE — Progress Notes (Addendum)
 NAME:  Tony Cantu, MRN:  [id number], DOB:  Jan 12, 1951, LOS: 4 ADMISSION DATE:  10/24/2023, CONSULTATION DATE:  10/23/2023 REFERRING MD:  Gwenyth Bouillon, CHIEF COMPLAINT:  syncope    History of Present Illness:  This is a 73 y.o. male with hx of type 1 diabetes, A-fib on Eliquis, cirrhosis, seizure disorder, aortic stenosis, hypertension, peptic ulcer disease, OA with bilateral knee replacement, polyclonal gammopathy, OSA on CPAP brought in by EMS from [facility] and Rehab to [facility] ED [facility] ED) on 2/24 for syncope. Per [facility] note: The ED provider had called [facility] rehab who notified that the patient had a syncopal episode that was not witnessed. Patient was found in his wheelchair, no fall. Patient was unresponsive for 10 to 15 minutes with low blood pressure.  He was brought to the ED with altered mental status and lethargic.  Initial vitals showed blood pressure of 67/41, bradycardia 55, afebrile.  Labs were notable for creatinine 2.27, troponin 67, lactate 6.8. BNP 639.  CT CAP showed findings of mild fecal impaction with possible sterile coral colitis.  Rectal exam notable for frank melena, no overt bleed with guaiac positive.  Patient remained hypotensive even after fluid resuscitation, he was started on Levophed drip.  EDP consulted general surgery who recommended no surgical evaluation at this time.  EDP also consulted GI at [facility] health who recommended holding blood thinners and plan to see him in the morning.  Patient is brought to [facility] today 2/25, evaluated at bedside. Poor historian but oriented to self and place. Reports about 2 weeks of generalized weakness and fatigue. Some lightheadedness but no dizziness. Endorse cough with notably blood. Endorses melanotic stools. Denies chest pain, SOB, abdominal pain, n/v. Poor appetite for past few weeks.  Of note: 01/29 recently discharged from [facility] for Sepsis Secondary Streptococcus Salivarius Bacteremia and esophogeal candidiasis with PICC line  to complete 4 weeks of IV ceftriaxone with end of treatment date 10/18/2023 and 3 weeks of p.o. Fluconazole.   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   Significant Hospital Events: Including procedures, antibiotic start and stop dates in addition to other pertinent events   2/25 transfer from [facility] for hematemesis c/f GI bleed and hypotensive needing levophed 2/26 started on precedex overnight due to agitation, GI signed off 2/27 Afib RVR on EKG, started amio infusion, converted to sinus  2/28 Afib RVR, gave additional amio bolus, converted to sinus 2/29 off levophed  Interim History / Subjective:  Alert and oriented. Denies chest or abdominal pain. No signs of bleeding.   Objective   Blood pressure 111/69, pulse 92, temperature 98.3 F (36.8 C), temperature source Oral, resp. rate 15, weight 84.8 kg, SpO2 94%.        Intake/Output Summary (Last 24 hours) at 10/28/2023 0636 Last data filed at 10/28/2023 0600 Gross per 24 hour  Intake 2098.21 ml  Output 2050 ml  Net 48.21 ml   Filed Weights   10/26/23 0500 10/27/23 0500 10/28/23 0500  Weight: 84.8 kg 84.8 kg 84.8 kg    Examination: General: awake, laying in bed comfortably, in no acute distress HENT: [place]/AT Lungs: normal effort on room air, CTAB Cardiovascular: RRR, systolic murmur at RUSB Abdomen: bowel sounds present, soft, non-tender, non-distended Extremities: no LE edema Neuro: alert and oriented x  3, moves all extremities   Resolved Hospital Problem list   Lactic acidosis   Assessment & Plan:  Shock, likely hypovolemic and DKA (initially c/f UGIB) Syncope Hx of hematemesis and melena c/f UGIB Hx of Esophageal candidiasis Hx of Portal  hypertensive gastropathy Hx of 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 He was discharged on 1/29 with PICC line to complete 4 weeks of IV ceftriaxone with EOT 10/18/2023 and 3 weeks of p.o. Fluconazole. CXR unremarkable on admission. UA from OSH unremarkable.  - Afebrile and no leukocytosis - DKA resolved, lactic acidosis resolved - GI has signed off, no plans for endoscopy, stopped octreotide and Rocephin for SBP ppx (no hx of varices)  - Protonix 40 mg BID - Off of Levophed since 0300, MAP goal >65  DKA, resolved T1DM Recent A1c 4.4. Elevated AG >15, BG 469. BHB elevated at admission. Started on Endotool. AG closed x 2, BHB trended down, transitioned to SQ insulin. - Fasting CBG elevated - increase glargine to 30 units daily - novolog 4 units TID AC - SSI-R  Afib with RVR on Eliquis Severe aortic stenosis  - Hold home Dilt 30 mg BID and Toprol XL 25 mg daily  - 2/27 noted to be in Afib RVR, started amio  - converted to sinus rhythm, continue amio drip  - on heparin drip now, Hgb has been stable no signs of bleed, consider transition to DOAC tomorrow   Anemia, Macrocytic  Hx of IDA - Iron studies unremarkable  - Hgb stable ~10, admit was 12.1 (appears BL 10-11) - trend CBC, no signs of bleeding  Hx of Streptococcus salivarius bacteremia  Recent hospitalization. Repeat Bcx 1/22 negative, was to complete IV abx via PICC with EOT 2/19. - [facility] collected Bcx x 2 on 2/24 - No fevers or leukocytosis on admission, remains afebrile and normal WBC - Repeat Bcx here, so far no growth   AKI, resolved Admit Scr 2.07 with baseline 0.8-0.9. Improved to baseline s/p IVF.   OSA  Hypoglossal Nerve Stimulator - Has R nerve stimulator in place, no signs of overlying  infection of area - CPAP at bedtime   Hx Seizure  - No dispense history of Keppra per chart review.   Best Practice (right click and "Reselect all SmartList Selections" daily)   Diet/type: Regular consistency (see orders) DVT prophylaxis systemic heparin Pressure ulcer(s): Elevated by RN GI prophylaxis: PPI Lines: PICC  Foley:  N/A Code Status:  full code Last date of multidisciplinary goals of care discussion [emergency contact updated 2/27 ]  Labs   CBC: Recent Labs  Lab 10/24/23 1744 10/24/23 2053 10/25/23 0659 10/25/23 1235 10/25/23 1830 10/26/23 0438 10/27/23 0444 10/28/23 0436  WBC 8.6 8.3 6.9  --   --   --  4.5 4.2  HGB 12.1* 12.5* 10.1* 9.4* 7.9* 10.7* 9.9* 10.0*  HCT 36.0* 37.1* 29.4* 27.9* 23.7* 31.8* 28.9* 29.4*  MCV 104.0* 107.2* 103.2*  --   --   --  102.1* 102.4*  PLT 162 167 131*  --   --   --  87* 89*    Basic Metabolic Panel: Recent Labs  Lab 10/25/23 0524 10/25/23 1235 10/25/23 1530 10/26/23 0335 10/26/23 2028 10/27/23 0444 10/28/23 0436  NA 138  --   --  137 133* 133* 134*  K 3.2*  --   --  3.8 3.2* 3.4* 3.4*  CL 108  --   --  103 98 98 101  CO2 25  --   --  24 26 24 27   GLUCOSE 168*  --   --  229* 186* 182* 192*  BUN 18  --   --  11 9 9 9   CREATININE 1.14  --   --  0.92 0.88 0.82 0.78  CALCIUM 9.5  --   --  8.7* 8.2* 8.4* 8.2*  MG  --  1.1*  --  1.6*  --  1.7 1.7  PHOS  --   --  2.1* 1.8*  --  1.8* 2.2*   GFR: Estimated Creatinine Clearance: 85.9 mL/min (by C-G formula based on SCr of 0.78 mg/dL). Recent Labs  Lab 10/24/23 2053 10/25/23 0510 10/25/23 0659 10/25/23 1230 10/26/23 0910 10/26/23 1110 10/26/23 2028 10/26/23 2254 10/27/23 0444 10/28/23 0436  PROCALCITON  --   --  0.18  --   --   --   --   --   --   --   WBC 8.3  --  6.9  --   --   --   --   --  4.5 4.2  LATICACIDVEN 3.9*   < >  --    < > 2.8* 3.2* 2.6* 1.9  --   --    < > = values in this interval not displayed.    Liver Function Tests: Recent Labs  Lab  10/24/23 1744  AST 42*  ALT 40  ALKPHOS 123  BILITOT 1.5*  PROT 6.8  ALBUMIN 2.5*   No results for input(s): "LIPASE", "AMYLASE" in the last 168 hours. Recent Labs  Lab 10/25/23 1420  AMMONIA 32    ABG    Component Value Date/Time   HCO3 21.7 09/20/2023 0532   ACIDBASEDEF 2.5 (H) 09/20/2023 0532   O2SAT 61.6 09/20/2023 0532     Coagulation Profile: Recent Labs  Lab 10/24/23 2053  INR 1.2    Cardiac Enzymes: No results for input(s): "CKTOTAL", "CKMB", "CKMBINDEX", "TROPONINI" in the last 168 hours.  HbA1C: HB A1C (BAYER DCA - WAIVED)  Date/Time Value Ref Range Status  09/13/2023 11:08 AM 4.6 (L) 4.8 - 5.6 % Final    Comment:             Prediabetes: 5.7 - 6.4          Diabetes: >6.4          Glycemic control for adults with diabetes: <7.0   04/26/2023 10:47 AM 4.2 (L) 4.8 - 5.6 % Final    Comment:             Prediabetes: 5.7 - 6.4          Diabetes: >6.4          Glycemic control for adults with diabetes: <7.0    Hgb A1c MFr Bld  Date/Time Value Ref Range Status  09/19/2023 06:48 PM 4.4 (L) 4.8 - 5.6 % Final    Comment:    (NOTE) Pre diabetes:          5.7%-6.4%  Diabetes:              >6.4%  Glycemic control for   <7.0% adults with diabetes     CBG: Recent Labs  Lab 10/26/23 2248 10/27/23 0755 10/27/23 1144 10/27/23 1734 10/27/23 2121  GLUCAP 196* 210* 140* 330* 256*    Review of Systems:  As per HPI   Past Medical History:  He,  has a past medical history of Aortic atherosclerosis (HCC), Aortic stenosis, Arthritis, Asthma, BPH (benign prostatic hypertrophy), Cirrhosis (HCC) (2016), Colon polyps, Diabetes mellitus without complication (HCC), Diverticulosis, Dysrhythmia (04/19/2021), Gastric ulcer, Gastric varices, Gastritis, GERD (gastroesophageal reflux disease), Headache, Heart murmur, History of kidney stones, adenomatous colonic polyps, Hypertension, Iron deficiency anemia due to chronic blood loss (08/16/2023), Portal hypertensive  gastropathy (HCC), Renal cyst, right, Seizures (HCC), Sleep apnea, Testicle trouble, and Tubular adenoma of colon.   Surgical History:   Past Surgical History:  Procedure Laterality Date   BACK SURGERY     89  LOWER    CARPAL TUNNEL RELEASE Right 10/13/2015   Procedure: RIGHT CARPAL TUNNEL RELEASE;  Surgeon: Cindee Salt, MD;  Location: [facility];  Service: Orthopedics;  Laterality: Right;   CARPAL TUNNEL RELEASE Left 07/05/2016   Procedure: LEFT CARPAL TUNNEL RELEASE;  Surgeon: Cindee Salt, MD;  Location: [facility];  Service: Orthopedics;  Laterality: Left;   COLONOSCOPY WITH ESOPHAGOGASTRODUODENOSCOPY (EGD)  10/05/2022   DRUG INDUCED ENDOSCOPY N/A 07/02/2021   Procedure: DRUG INDUCED SLEEP ENDOSCOPY;  Surgeon: Christia Reading, MD;  Location: [facility] OR;  Service: ENT;  Laterality: N/A;   ESOPHAGOGASTRODUODENOSCOPY (EGD) WITH PROPOFOL N/A 09/25/2023   Procedure: ESOPHAGOGASTRODUODENOSCOPY (EGD) WITH PROPOFOL;  Surgeon: Sherrilyn Rist, MD;  Location: [facility] ENDOSCOPY;  Service: Gastroenterology;  Laterality: N/A;  EGD needs to be coordinated with TEE.  Patient has severe aortic stenosis, procedures need to be done to follow each other with 1 sedation   IMPLANTATION OF HYPOGLOSSAL NERVE STIMULATOR Right 10/01/2021   Procedure: IMPLANTATION OF HYPOGLOSSAL NERVE STIMULATOR;  Surgeon: Christia Reading, MD;  Location: [facility] OR;  Service: ENT;  Laterality: Right;   INGUINAL HERNIA REPAIR  2003   right    KNEE ARTHROSCOPY Right 03/03/2016   LUMBAR DISC SURGERY  03/1995   Dr. Daleen Squibb, discectomy   LUMBAR LAMINECTOMY/DECOMPRESSION MICRODISCECTOMY Right 10/06/2016   Procedure: RIGHT LUMBAR THREE - LUMBAR FOUR  LAMINECTOMY, FORAMINOTOMY AND MICRODISCECTOMY;  Surgeon: Shirlean Kelly, MD;  Location: [facility] OR;  Service: Neurosurgery;  Laterality: Right;   SHOULDER SURGERY  11/28/2005   left partial   SHOULDER SURGERY  07/14/2006   RIGHT   TEE WITHOUT CARDIOVERSION N/A 09/25/2023    Procedure: TRANSESOPHAGEAL ECHOCARDIOGRAM (TEE);  Surgeon: Dolores Patty, MD;  Location: [facility] ENDOSCOPY;  Service: Cardiovascular;  Laterality: N/A;   TONSILLECTOMY  AGE 41 OR 4   TOTAL KNEE ARTHROPLASTY Right 03/07/2022   Procedure: RIGHT TOTAL KNEE ARTHROPLASTY;  Surgeon: Gean Birchwood, MD;  Location: [facility];  Service: Orthopedics;  Laterality: Right;   TOTAL KNEE ARTHROPLASTY Left 06/06/2022   Procedure: LEFT TOTAL KNEE ARTHROPLASTY;  Surgeon: Gean Birchwood, MD;  Location: [facility];  Service: Orthopedics;  Laterality: Left;   TOTAL SHOULDER ARTHROPLASTY Right 11/01/2018   Procedure: RIGHT SHOULDER REVISION TO REVERSE TOTAL SHOULDER;  Surgeon: Jones Broom, MD;  Location: [facility];  Service: Orthopedics;  Laterality: Right;  CHOICE ANESTHESIA WITH INTERSCALENE BLOCK EXPAREL, NEEDS RNFA     Social History:   reports that he quit smoking about 20 years ago. His smoking use included cigarettes and pipe. He started smoking about 54 years ago. He has never used smokeless tobacco. He reports that he does not currently use alcohol. He reports that he does not use drugs.   Family History:  His family history includes Allergic rhinitis in his sister; Alzheimer's disease in his mother; Asthma in his  father; Breast cancer in his maternal grandmother; Diabetes in his mother; Heart attack in his maternal grandmother and mother; Heart failure in his maternal grandmother and mother; Rheum arthritis in his maternal grandmother; Suicidality in his father. There is no history of Colon cancer, Esophageal cancer, Pancreatic cancer, or Liver disease.   Allergies Allergies  Allergen Reactions   Dimetapp Children's Cold-Cough Other (See Comments)    Chest discomfort    Erythromycin Diarrhea   Sulfonamide Derivatives Diarrhea     Home Medications  Prior to Admission medications   Medication Sig Start Date End Date Taking? Authorizing Provider  acetaminophen (TYLENOL) 500 MG tablet Take 1,000 mg by mouth every  6 (six) hours as needed for moderate pain.    [provider]  apixaban (ELIQUIS) 5 MG TABS tablet Take 1 tablet (5 mg total) by mouth 2 (two) times daily. 06/08/23   [name], DO  atorvastatin (LIPITOR) 40 MG tablet Take 1 tablet (40 mg total) by mouth daily. 06/08/23   [name], DO  b complex vitamins tablet Take 1 tablet by mouth daily.    [provider]  Blood Glucose Monitoring Suppl (CONTOUR NEXT ONE) KIT Test BS QID and as needed Dx E11.9 06/04/20   Delynn Flavin M, DO  cefTRIAXone (ROCEPHIN) IVPB Inject 2 g into the vein daily. Indication:  10/18/23 First Dose: Yes Last Day of Therapy:  10/18/23 Labs - Once weekly:  CBC/D and BMP, Labs - Once weekly: ESR and CRP 09/27/23   Lewie Chamber, MD  Cholecalciferol (DIALYVITE VITAMIN D 5000) 125 MCG (5000 UT) capsule Take 5,000 Units by mouth daily.    [provider]  Chromium Picolinate (CHROMIUM PICOLATE PO) Take 1 tablet by mouth daily.    [provider]  CINNAMON PO Take 1,080 mg by mouth daily.    [provider]  Coenzyme Q10 (COQ10) 100 MG CAPS Take 100 mg by mouth daily.    [provider]  Continuous Blood Gluc Sensor (FREESTYLE LIBRE SENSOR SYSTEM) MISC Check BS eight (8) times a day. Dx E10.9 06/29/22   Delynn Flavin M, DO  diltiazem (CARDIZEM) 30 MG tablet Take 1 tablet (30 mg total) by mouth 2 (two) times daily. Patient must schedule annual office visit for further refills 06/26/23   Rollene Rotunda, MD  ferrous sulfate 325 (65 FE) MG EC tablet Take 1 tablet (325 mg total) by mouth daily with breakfast. 01/18/23   Rojelio Brenner M, PA-C  furosemide (LASIX) 20 MG tablet Take 1.5 tablets (30 mg total) by mouth daily. 06/08/23   [name], DO  Glucagon, rDNA, (GLUCAGON EMERGENCY) 1 MG KIT INJECT AS DIRECTED INTO  UPPER ARM, THIGH OR  BUTTOCKS AS NEEDED FOR  SEVERE HYPOGLYCEMIA. SEEK  MEDICAL ATTENTION AFTER USE Patient taking differently:  Inject 1 mg into the muscle as needed (HYPOGLYCEMIA). 06/14/21   Gwenlyn Fudge, FNP  Glucosamine-Chondroit-Vit C-Mn (GLUCOSAMINE 1500 COMPLEX) CAPS Take 2 capsules by mouth daily.    [provider]  glucose blood (CONTOUR NEXT TEST) test strip Test BS QID and as needed Dx E11.9 11/08/21   Delynn Flavin M, DO  insulin aspart (NOVOLOG) 100 UNIT/ML injection Inject 2-15 Units into the skin 4 (four) times daily -  before meals and at bedtime. CBG 121 - 150: 2 units, CBG 151 - 200: 3 units, CBG 201 - 250: 5 units, CBG 251 - 300: 8 units, CBG 301 - 350: 11 units, CBG 351 - 400: 15 units, CBG >  400call MD 09/27/23   Lewie Chamber, MD  insulin aspart (NOVOLOG) 100 UNIT/ML injection Inject 8 Units into the skin 3 (three) times daily with meals. 09/27/23   Lewie Chamber, MD  insulin glargine, 2 Unit Dial, (TOUJEO MAX SOLOSTAR) 300 UNIT/ML Solostar Pen Inject 20 Units into the skin daily. 09/27/23   Lewie Chamber, MD  Magnesium 250 MG TABS Take 250 mg by mouth daily.    [provider]  metoprolol succinate (TOPROL-XL) 25 MG 24 hr tablet Take 1 tablet (25 mg total) by mouth daily. 09/28/23   Lewie Chamber, MD  Microlet Lancets MISC Test BS QID and as needed Dx E11.9 06/04/20   Delynn Flavin M, DO  montelukast (SINGULAIR) 10 MG tablet Take 1 tablet (10 mg total) by mouth at bedtime. 06/08/23   [name], DO  Multiple Vitamins-Minerals (EYE HEALTH) CAPS Take 1 capsule by mouth daily.    [provider]  pantoprazole (PROTONIX) 40 MG tablet TAKE 1 TABLET BY MOUTH TWICE  DAILY BEFORE MEALS 10/16/23   Pyrtle, Carie Caddy, MD  potassium chloride SA (KLOR-CON M) 20 MEQ tablet Take 1 tablet (20 mEq total) by mouth daily. 06/08/23   [name], DO  ramelteon (ROZEREM) 8 MG tablet Take 1 tablet (8 mg total) by mouth at bedtime. 06/08/23   [name], DO  Testosterone 30 MG/ACT SOLN Apply 1 pump under each axilla once daily. 07/21/23   [name], DO   Turmeric Curcumin 500 MG CAPS Take 500 mg by mouth daily.    [provider]  valsartan (DIOVAN) 40 MG tablet Take 1 tablet (40 mg total) by mouth daily. 06/08/23   [name], DO     Critical care time:      Signature: Rana Snare, DO Internal Medicine Resident PGY-2

## 2023-10-28 NOTE — Progress Notes (Signed)
 [facility] ADULT ICU REPLACEMENT PROTOCOL   The patient does apply for the [facility] Adult ICU Electrolyte Replacment Protocol based on the criteria listed below:   1.Exclusion criteria: TCTS, ECMO, Dialysis, and Myasthenia Gravis patients 2. Is GFR >/= 30 ml/min? Yes.    Patient's GFR today is >60 3. Is SCr </= 2? Yes.   Patient's SCr is 0.78 mg/dL 4. Did SCr increase >/= 0.5 in 24 hours? No. 5.Pt's weight >40kg  Yes.   6. Abnormal electrolyte(s): K+ 3.4, Phos 2.2, Mag 1.7  7. Electrolytes replaced per protocol 8.  Call MD STAT for K+ </= 2.5, Phos </= 1, or Mag </= 1 Physician:  Elijio Miles [facility] 10/28/2023 5:26 AM

## 2023-10-29 DIAGNOSIS — I4891 Unspecified atrial fibrillation: Secondary | ICD-10-CM | POA: Diagnosis not present

## 2023-10-29 LAB — CBC
HCT: 29.7 % — ABNORMAL LOW (ref 39.0–52.0)
Hemoglobin: 10 g/dL — ABNORMAL LOW (ref 13.0–17.0)
MCH: 34.8 pg — ABNORMAL HIGH (ref 26.0–34.0)
MCHC: 33.7 g/dL (ref 30.0–36.0)
MCV: 103.5 fL — ABNORMAL HIGH (ref 80.0–100.0)
Platelets: 96 10*3/uL — ABNORMAL LOW (ref 150–400)
RBC: 2.87 MIL/uL — ABNORMAL LOW (ref 4.22–5.81)
RDW: 17.1 % — ABNORMAL HIGH (ref 11.5–15.5)
WBC: 4.2 10*3/uL (ref 4.0–10.5)
nRBC: 0 % (ref 0.0–0.2)

## 2023-10-29 LAB — BASIC METABOLIC PANEL
Anion gap: 7 (ref 5–15)
BUN: 8 mg/dL (ref 8–23)
CO2: 25 mmol/L (ref 22–32)
Calcium: 8.4 mg/dL — ABNORMAL LOW (ref 8.9–10.3)
Chloride: 105 mmol/L (ref 98–111)
Creatinine, Ser: 0.83 mg/dL (ref 0.61–1.24)
GFR, Estimated: >60 mL/min (ref >60)
Glucose, Bld: 249 mg/dL — ABNORMAL HIGH (ref 70–99)
Potassium: 3.3 mmol/L — ABNORMAL LOW (ref 3.5–5.1)
Sodium: 137 mmol/L (ref 135–145)

## 2023-10-29 LAB — MAGNESIUM: Magnesium: 1.6 mg/dL — ABNORMAL LOW (ref 1.7–2.4)

## 2023-10-29 LAB — HEPARIN LEVEL (UNFRACTIONATED): Heparin Unfractionated: 0.4 [IU]/mL (ref 0.30–0.70)

## 2023-10-29 LAB — GLUCOSE, CAPILLARY
Glucose-Capillary: 286 mg/dL — ABNORMAL HIGH (ref 70–99)
Glucose-Capillary: 238 mg/dL — ABNORMAL HIGH (ref 70–99)
Glucose-Capillary: 249 mg/dL — ABNORMAL HIGH (ref 70–99)
Glucose-Capillary: 172 mg/dL — ABNORMAL HIGH (ref 70–99)

## 2023-10-29 LAB — PHOSPHORUS: Phosphorus: 2.5 mg/dL (ref 2.5–4.6)

## 2023-10-29 MED ORDER — AMIODARONE HCL 200 MG PO TABS
400.0000 mg | ORAL_TABLET | Freq: Every day | ORAL | Status: DC
Start: 2023-11-03 — End: 2023-11-03
  Administered 2023-11-03: 400 mg via ORAL
  Filled 2023-10-29 (×2): qty 2

## 2023-10-29 MED ORDER — POTASSIUM CHLORIDE CRYS ER 10 MEQ PO TBCR
40.0000 meq | EXTENDED_RELEASE_TABLET | ORAL | Status: AC
  Administered 2023-10-29 (×2): 40 meq via ORAL
  Filled 2023-10-29 (×2): qty 4

## 2023-10-29 MED ORDER — AMIODARONE HCL 200 MG PO TABS
200.0000 mg | ORAL_TABLET | Freq: Every day | ORAL | Status: DC
Start: 2023-10-29 — End: 2023-10-29

## 2023-10-29 MED ORDER — AMIODARONE HCL 200 MG PO TABS
400.0000 mg | ORAL_TABLET | Freq: Two times a day (BID) | ORAL | Status: AC
  Administered 2023-10-29 – 2023-11-02 (×10): 400 mg via ORAL
  Filled 2023-10-29 (×10): qty 2

## 2023-10-29 MED ORDER — ALBUMIN HUMAN 25 % IV SOLN
INTRAVENOUS | Status: AC
  Administered 2023-10-29: 25 g via INTRAVENOUS
  Filled 2023-10-29: qty 50

## 2023-10-29 MED ORDER — LACTATED RINGERS IV BOLUS
500.0000 mL | Freq: Once | INTRAVENOUS | Status: AC
  Administered 2023-10-29: 500 mL via INTRAVENOUS

## 2023-10-29 MED ORDER — ALBUMIN HUMAN 25 % IV SOLN
25.0000 g | Freq: Four times a day (QID) | INTRAVENOUS | Status: AC
  Administered 2023-10-29 – 2023-10-30 (×3): 25 g via INTRAVENOUS
  Filled 2023-10-29 (×4): qty 100

## 2023-10-29 MED ORDER — INSULIN ASPART 100 UNIT/ML IJ SOLN
12.0000 [IU] | Freq: Three times a day (TID) | INTRAMUSCULAR | Status: DC
  Administered 2023-10-29 – 2023-10-31 (×6): 12 [IU] via SUBCUTANEOUS

## 2023-10-29 MED ORDER — INSULIN GLARGINE 100 UNIT/ML ~~LOC~~ SOLN
25.0000 [IU] | Freq: Two times a day (BID) | SUBCUTANEOUS | Status: DC
  Administered 2023-10-29 – 2023-10-31 (×3): 25 [IU] via SUBCUTANEOUS
  Filled 2023-10-29 (×5): qty 0.25

## 2023-10-29 MED ORDER — AMIODARONE HCL 200 MG PO TABS: 200.0000 mg | ORAL_TABLET | Freq: Every day | ORAL | Status: DC

## 2023-10-29 MED ORDER — MAGNESIUM SULFATE 4 GM/100ML IV SOLN
4.0000 g | Freq: Once | INTRAVENOUS | Status: AC
  Administered 2023-10-29: 4 g via INTRAVENOUS
  Filled 2023-10-29: qty 100

## 2023-10-29 NOTE — Progress Notes (Signed)
 NAME:  Tony Cantu, MRN:  [id number], DOB:  Nov 18, 1950, LOS: 5 ADMISSION DATE:  10/24/2023, CONSULTATION DATE:  10/23/2023 REFERRING MD:  Gwenyth Bouillon, CHIEF COMPLAINT:  syncope    History of Present Illness:  This is a 73 y.o. male with hx of type 1 diabetes, A-fib on Eliquis, cirrhosis, seizure disorder, aortic stenosis, hypertension, peptic ulcer disease, OA with bilateral knee replacement, polyclonal gammopathy, OSA on CPAP brought in by EMS from [facility] to [facility]) on 2/24 for syncope. Per [facility] note: The ED provider had called [facility] rehab who notified that the patient had a syncopal episode that was not witnessed. Patient was found in his wheelchair, no fall. Patient was unresponsive for 10 to 15 minutes with low blood pressure.  He was brought to the ED with altered mental status and lethargic.  Initial vitals showed blood pressure of 67/41, bradycardia 55, afebrile.  Labs were notable for creatinine 2.27, troponin 67, lactate 6.8. BNP 639.  CT CAP showed findings of mild fecal impaction with possible sterile coral colitis.  Rectal exam notable for frank melena, no overt bleed with guaiac positive.  Patient remained hypotensive even after fluid resuscitation, he was started on Levophed drip.  EDP consulted general surgery who recommended no surgical evaluation at this time.  EDP also consulted GI at [facility] health who recommended holding blood thinners and plan to see him in the morning.  Patient is brought to [facility] today 2/25, evaluated at bedside. Poor historian but oriented to self and place. Reports about 2 weeks of generalized weakness and fatigue. Some lightheadedness but no dizziness. Endorse cough with notably blood. Endorses melanotic stools. Denies chest pain, SOB, abdominal pain, n/v. Poor appetite for past few weeks.  Of note: 01/29 recently discharged from [facility] for Sepsis Secondary Streptococcus Salivarius Bacteremia and esophogeal candidiasis with PICC line  to complete 4 weeks of IV ceftriaxone with end of treatment date 10/18/2023 and 3 weeks of p.o. Fluconazole.   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   Significant Hospital Events: Including procedures, antibiotic start and stop dates in addition to other pertinent events   2/25 transfer from [facility] for hematemesis c/f GI bleed and hypotensive needing levophed 2/26 started on precedex overnight due to agitation, GI signed off 2/27 Afib RVR on EKG, started amio infusion, converted to sinus  2/28 Afib RVR, gave additional amio bolus, converted to sinus 2/29 off levophed  Interim History / Subjective:  Alert and oriented.   Objective   Blood pressure 95/63, pulse 83, temperature 98.2 F (36.8 C), temperature source Oral, resp. rate (!) 21, weight 89.6 kg, SpO2 93%.        Intake/Output Summary (Last 24 hours) at 10/29/2023 0857 Last data filed at 10/29/2023 0800 Gross per 24 hour  Intake 1907.17 ml  Output 3700 ml  Net -1792.83 ml   Filed Weights   10/27/23 0500 10/28/23 0500 10/29/23 0710  Weight: 84.8 kg 84.8 kg 89.6 kg    Examination: General: awake, laying in bed comfortably, in no acute distress HENT: [place]/AT Lungs: normal effort on room air, CTAB Cardiovascular: RRR, systolic murmur at RUSB Abdomen: bowel sounds present, soft, non-tender, non-distended Extremities: no LE edema Neuro: alert and oriented x 3, moves all extremities   Resolved Hospital  Problem list   Lactic acidosis   Assessment & Plan:  Shock: Likely hypovolemic, in setting of DKA, also with anemia, blood loss considered. --Resolved now weaned off vasopressors after fluids and blood administration -- PPI twice daily -- GI signed  off   DKA in setting type 1 diabetes: DKA resolved -- Continue subcutaneous insulin   A-fib with RVR: --Hold home diltiazem and metoprolol with low blood pressure --Transition amio drip to PO, continue load --Continue heparin drip, consider DOAC if remains stable    AKI: Hypovolemic in setting of DKA etc. -- Improved after IV fluids   Severe AS: --Supportive care, avoiding midodrine to try to minimize afterload  Best Practice (right click and "Reselect all SmartList Selections" daily)   Diet/type: Regular consistency (see orders) DVT prophylaxis systemic heparin Pressure ulcer(s): Elevated by RN GI prophylaxis: PPI Lines: PICC  Foley:  N/A Code Status:  full code Last date of multidisciplinary goals of care discussion [discussed with patient at bedside]  Labs   CBC: Recent Labs  Lab 10/24/23 2053 10/25/23 0659 10/25/23 1235 10/25/23 1830 10/26/23 0438 10/27/23 0444 10/28/23 0436 10/29/23 0500  WBC 8.3 6.9  --   --   --  4.5 4.2 4.2  HGB 12.5* 10.1*   < > 7.9* 10.7* 9.9* 10.0* 10.0*  HCT 37.1* 29.4*   < > 23.7* 31.8* 28.9* 29.4* 29.7*  MCV 107.2* 103.2*  --   --   --  102.1* 102.4* 103.5*  PLT 167 131*  --   --   --  87* 89* 96*   < > = values in this interval not displayed.    Basic Metabolic Panel: Recent Labs  Lab 10/25/23 1235 10/25/23 1530 10/26/23 0335 10/26/23 2028 10/27/23 0444 10/28/23 0436 10/29/23 0500  NA  --   --  137 133* 133* 134* 137  K  --   --  3.8 3.2* 3.4* 3.4* 3.3*  CL  --   --  103 98 98 101 105  CO2  --   --  24 26 24 27 25   GLUCOSE  --   --  229* 186* 182* 192* 249*  BUN  --   --  11 9 9 9 8   CREATININE  --   --  0.92 0.88 0.82 0.78 0.83  CALCIUM  --   --  8.7* 8.2* 8.4* 8.2* 8.4*  MG 1.1*  --  1.6*  --  1.7 1.7 1.6*  PHOS  --  2.1* 1.8*  --  1.8* 2.2* 2.5   GFR: Estimated Creatinine Clearance: 84.9 mL/min (by C-G formula based on SCr of 0.83 mg/dL). Recent Labs  Lab 10/25/23 0659 10/25/23 1230 10/26/23 0910  10/26/23 1110 10/26/23 2028 10/26/23 2254 10/27/23 0444 10/28/23 0436 10/29/23 0500  PROCALCITON 0.18  --   --   --   --   --   --   --   --   WBC 6.9  --   --   --   --   --  4.5 4.2 4.2  LATICACIDVEN  --    < > 2.8* 3.2* 2.6* 1.9  --   --   --    < > = values in this interval not displayed.    Liver Function Tests: Recent Labs  Lab 10/24/23 1744  AST 42*  ALT 40  ALKPHOS 123  BILITOT 1.5*  PROT 6.8  ALBUMIN 2.5*   No results for input(s): "LIPASE", "AMYLASE" in the last 168 hours. Recent Labs  Lab 10/25/23 1420  AMMONIA 32    ABG    Component Value Date/Time   HCO3 21.7 09/20/2023 0532   ACIDBASEDEF 2.5 (H) 09/20/2023 0532   O2SAT 61.6 09/20/2023 0532     Coagulation Profile: Recent Labs  Lab 10/24/23 2053  INR 1.2    Cardiac Enzymes: No results for input(s): "CKTOTAL", "CKMB", "CKMBINDEX", "TROPONINI" in the last 168 hours.  HbA1C: HB A1C (BAYER DCA - WAIVED)  Date/Time Value Ref Range Status  09/13/2023 11:08 AM 4.6 (L) 4.8 - 5.6 % Final    Comment:             Prediabetes: 5.7 - 6.4          Diabetes: >6.4          Glycemic control for adults with diabetes: <7.0   04/26/2023 10:47 AM 4.2 (L) 4.8 - 5.6 % Final    Comment:             Prediabetes: 5.7 - 6.4          Diabetes: >6.4          Glycemic control for adults with diabetes: <7.0    Hgb A1c MFr Bld  Date/Time Value Ref Range Status  09/19/2023 06:48 PM 4.4 (L) 4.8 - 5.6 % Final    Comment:    (NOTE) Pre diabetes:          5.7%-6.4%  Diabetes:              >6.4%  Glycemic control for   <7.0% adults with diabetes     CBG: Recent Labs  Lab 10/27/23 1734 10/27/23 2121 10/28/23 0828 10/28/23 1153 10/29/23 0834  GLUCAP 330* 256* 230* 207* 286*    Review of Systems:   N/a  Past Medical History:  He,  has a past medical history of Aortic atherosclerosis (HCC), Aortic stenosis, Arthritis, Asthma, BPH (benign prostatic hypertrophy), Cirrhosis (HCC) (2016), Colon polyps,  Diabetes mellitus without complication (HCC), Diverticulosis, Dysrhythmia (04/19/2021), Gastric ulcer, Gastric varices, Gastritis, GERD (gastroesophageal reflux disease), Headache, Heart murmur, History of kidney stones, adenomatous colonic polyps, Hypertension, Iron deficiency anemia due to chronic blood loss (08/16/2023), Portal hypertensive gastropathy (HCC), Renal cyst, right, Seizures (HCC), Sleep apnea, Testicle trouble, and Tubular adenoma of colon.   Surgical History:   Past Surgical History:  Procedure Laterality Date   BACK SURGERY     89  LOWER    CARPAL TUNNEL RELEASE Right 10/13/2015   Procedure: RIGHT CARPAL TUNNEL RELEASE;  Surgeon: Cindee Salt, MD;  Location: [facility];  Service: Orthopedics;  Laterality: Right;   CARPAL TUNNEL RELEASE Left 07/05/2016   Procedure: LEFT CARPAL TUNNEL RELEASE;  Surgeon: Cindee Salt, MD;  Location:  SURGERY CENTER;  Service: Orthopedics;  Laterality: Left;   COLONOSCOPY WITH ESOPHAGOGASTRODUODENOSCOPY (EGD)  10/05/2022   DRUG INDUCED ENDOSCOPY N/A 07/02/2021   Procedure: DRUG INDUCED SLEEP ENDOSCOPY;  Surgeon: Christia Reading, MD;  Location: [facility] OR;  Service: ENT;  Laterality: N/A;   ESOPHAGOGASTRODUODENOSCOPY (EGD) WITH PROPOFOL N/A 09/25/2023   Procedure: ESOPHAGOGASTRODUODENOSCOPY (EGD) WITH PROPOFOL;  Surgeon: Sherrilyn Rist, MD;  Location: [facility] END[facility]COPY;  Service: Gastroenterology;  Laterality: N/A;  EGD needs to be coordinated with TEE.  Patient has severe aortic stenosis, procedures need to be done to follow each other with 1 sedation   IMPLANTATION OF HYPOGLOSSAL NERVE STIMULATOR Right 10/01/2021   Procedure: IMPLANTATION OF HYPOGLOSSAL NERVE STIMULATOR;  Surgeon: Christia Reading, MD;  Location: [facility] OR;  Service: ENT;  Laterality: Right;   INGUINAL HERNIA REPAIR  2003   right    KNEE ARTHROSCOPY Right 03/03/2016   LUMBAR DISC SURGERY  03/1995   Dr. Daleen Squibb, discectomy   LUMBAR LAMINECTOMY/DECOMPRESSION  MICRODISCECTOMY Right 10/06/2016   Procedure: RIGHT LUMBAR THREE - LUMBAR FOUR  LAMINECTOMY, FORAMINOTOMY AND MICRODISCECTOMY;  Surgeon: Shirlean Kelly, MD;  Location: [facility] OR;  Service: Neurosurgery;  Laterality: Right;   SHOULDER SURGERY  11/28/2005   left partial   SHOULDER SURGERY  07/14/2006   RIGHT   TEE WITHOUT CARDIOVERSION N/A 09/25/2023   Procedure: TRANSESOPHAGEAL ECHOCARDIOGRAM (TEE);  Surgeon: Dolores Patty, MD;  Location: [facility] ENDOSCOPY;  Service: Cardiovascular;  Laterality: N/A;   TONSILLECTOMY  AGE 72 OR 4   TOTAL KNEE ARTHROPLASTY Right 03/07/2022   Procedure: RIGHT TOTAL KNEE ARTHROPLASTY;  Surgeon: Gean Birchwood, MD;  Location: [facility] ORS;  Service: Orthopedics;  Laterality: Right;   TOTAL KNEE ARTHROPLASTY Left 06/06/2022   Procedure: LEFT TOTAL KNEE ARTHROPLASTY;  Surgeon: Gean Birchwood, MD;  Location: [facility];  Service: Orthopedics;  Laterality: Left;   TOTAL SHOULDER ARTHROPLASTY Right 11/01/2018   Procedure: RIGHT SHOULDER REVISION TO REVERSE TOTAL SHOULDER;  Surgeon: Jones Broom, MD;  Location: [facility];  Service: Orthopedics;  Laterality: Right;  CHOICE ANESTHESIA WITH INTERSCALENE BLOCK EXPAREL, NEEDS RNFA     Social History:   reports that he quit smoking about 20 years ago. His smoking use included cigarettes and pipe. He started smoking about 54 years ago. He has never used smokeless tobacco. He reports that he does not currently use alcohol. He reports that he does not use drugs.   Family History:  His family history includes Allergic rhinitis in his sister; Alzheimer's disease in his mother; Asthma in his father; Breast cancer in his maternal grandmother; Diabetes in his mother; Heart attack in his maternal grandmother and mother; Heart failure in his maternal grandmother and mother; Rheum arthritis in his maternal grandmother; Suicidality in his father. There is no history of Colon cancer, Esophageal cancer, Pancreatic cancer, or Liver disease.    Allergies Allergies  Allergen Reactions   Dimetapp Children's Cold-Cough Other (See Comments)    Chest discomfort    Erythromycin Diarrhea   Sulfonamide Derivatives Diarrhea     Home Medications  Prior to Admission medications   Medication Sig Start Date End Date Taking? Authorizing Provider  acetaminophen (TYLENOL) 500 MG tablet Take 1,000 mg by mouth every 6 (six) hours as needed for moderate pain.    [provider]  apixaban (ELIQUIS) 5 MG TABS tablet Take 1 tablet (5 mg total) by mouth 2 (two) times daily. 06/08/23   [name], DO  atorvastatin (LIPITOR) 40 MG tablet Take 1 tablet (40 mg total) by mouth daily. 06/08/23   [name], DO  b complex vitamins tablet Take 1 tablet by mouth daily.    [provider]  Blood Glucose Monitoring Suppl (CONTOUR NEXT ONE) KIT Test BS QID and as needed Dx E11.9 06/04/20   Delynn Flavin M, DO  cefTRIAXone (ROCEPHIN) IVPB Inject 2 g into the vein daily. Indication:  10/18/23 First Dose: Yes Last Day of Therapy:  10/18/23 Labs - Once weekly:  CBC/D and BMP, Labs - Once weekly: ESR and CRP 09/27/23   Lewie Chamber, MD  Cholecalciferol (DIALYVITE VITAMIN D 5000) 125 MCG (5000 UT) capsule Take 5,000 Units by mouth daily.    [provider]  Chromium Picolinate (CHROMIUM PICOLATE PO) Take 1 tablet by mouth daily.  [provider]  CINNAMON PO Take 1,080 mg by mouth daily.    [provider]  Coenzyme Q10 (COQ10) 100 MG CAPS Take 100 mg by mouth daily.    [provider]  Continuous Blood Gluc Sensor (FREESTYLE LIBRE SENSOR SYSTEM) MISC Check BS eight (8) times a day. Dx E10.9 06/29/22   Delynn Flavin M, DO  diltiazem (CARDIZEM) 30 MG tablet Take 1 tablet (30 mg total) by mouth 2 (two) times daily. Patient must schedule annual office visit for further refills 06/26/23   Rollene Rotunda, MD  ferrous sulfate 325 (65 FE) MG EC tablet Take 1 tablet (325 mg total) by mouth  daily with breakfast. 01/18/23   Rojelio Brenner M, PA-C  furosemide (LASIX) 20 MG tablet Take 1.5 tablets (30 mg total) by mouth daily. 06/08/23   [name], DO  Glucagon, rDNA, (GLUCAGON EMERGENCY) 1 MG KIT INJECT AS DIRECTED INTO  UPPER ARM, THIGH OR  BUTTOCKS AS NEEDED FOR  SEVERE HYPOGLYCEMIA. SEEK  MEDICAL ATTENTION AFTER USE Patient taking differently: Inject 1 mg into the muscle as needed (HYPOGLYCEMIA). 06/14/21   Gwenlyn Fudge, FNP  Glucosamine-Chondroit-Vit C-Mn (GLUCOSAMINE 1500 COMPLEX) CAPS Take 2 capsules by mouth daily.    [provider]  glucose blood (CONTOUR NEXT TEST) test strip Test BS QID and as needed Dx E11.9 11/08/21   Delynn Flavin M, DO  insulin aspart (NOVOLOG) 100 UNIT/ML injection Inject 2-15 Units into the skin 4 (four) times daily -  before meals and at bedtime. CBG 121 - 150: 2 units, CBG 151 - 200: 3 units, CBG 201 - 250: 5 units, CBG 251 - 300: 8 units, CBG 301 - 350: 11 units, CBG 351 - 400: 15 units, CBG > 400call MD 09/27/23   Lewie Chamber, MD  insulin aspart (NOVOLOG) 100 UNIT/ML injection Inject 8 Units into the skin 3 (three) times daily with meals. 09/27/23   Lewie Chamber, MD  insulin glargine, 2 Unit Dial, (TOUJEO MAX SOLOSTAR) 300 UNIT/ML Solostar Pen Inject 20 Units into the skin daily. 09/27/23   Lewie Chamber, MD  Magnesium 250 MG TABS Take 250 mg by mouth daily.    [provider]  metoprolol succinate (TOPROL-XL) 25 MG 24 hr tablet Take 1 tablet (25 mg total) by mouth daily. 09/28/23   Lewie Chamber, MD  Microlet Lancets MISC Test BS QID and as needed Dx E11.9 06/04/20   Delynn Flavin M, DO  montelukast (SINGULAIR) 10 MG tablet Take 1 tablet (10 mg total) by mouth at bedtime. 06/08/23   [name], DO  Multiple Vitamins-Minerals (EYE HEALTH) CAPS Take 1 capsule by mouth daily.    [provider]  pantoprazole (PROTONIX) 40 MG tablet TAKE 1 TABLET BY MOUTH TWICE  DAILY BEFORE MEALS 10/16/23    Pyrtle, Carie Caddy, MD  potassium chloride SA (KLOR-CON M) 20 MEQ tablet Take 1 tablet (20 mEq total) by mouth daily. 06/08/23   [name], DO  ramelteon (ROZEREM) 8 MG tablet Take 1 tablet (8 mg total) by mouth at bedtime. 06/08/23   [name], DO  Testosterone 30 MG/ACT SOLN Apply 1 pump under each axilla once daily. 07/21/23   [name], DO  Turmeric Curcumin 500 MG CAPS Take 500 mg by mouth daily.    [provider]  valsartan (DIOVAN) 40 MG tablet Take 1 tablet (40 mg total) by mouth daily. 06/08/23   [name], DO     Critical care time: n/a  Karren Burly, MD See Loretha Stapler

## 2023-10-29 NOTE — Progress Notes (Signed)
 Received patient from @M  alert and oriented no distress noted

## 2023-10-29 NOTE — Progress Notes (Signed)
 PHARMACY - ANTICOAGULATION CONSULT NOTE  Pharmacy Consult for heparin Indication: atrial fibrillation  Allergies  Allergen Reactions   Dimetapp Children's Cold-Cough Other (See Comments)    Chest discomfort    Erythromycin Diarrhea   Sulfonamide Derivatives Diarrhea    Patient Measurements: Weight: 84.8 kg (186 lb 15.2 oz) Heparin Dosing Weight: 84.8 kg  Vital Signs: Temp: 98.4 F (36.9 C) (03/01 2000) Temp Source: Oral (03/01 2000) BP: 104/61 (03/02 0500) Pulse Rate: 80 (03/02 0500)  Labs: Recent Labs    10/27/23 0444 10/27/23 1100 10/27/23 2136 10/28/23 0436 10/29/23 0500  HGB 9.9*  --   --  10.0* 10.0*  HCT 28.9*  --   --  29.4* 29.7*  PLT 87*  --   --  89* 96*  APTT 121*  --   --   --   --   HEPARINUNFRC 0.39   < > 0.49 0.50 0.40  CREATININE 0.82  --   --  0.78 0.83   < > = values in this interval not displayed.    Estimated Creatinine Clearance: 82.8 mL/min (by C-G formula based on SCr of 0.83 mg/dL).    Assessment: 42 YOM w/ PMH that includes AF on apixaban PTA. Last dose taken 2/24 at 0800 He was admitted w/ hematemasis c/g GIB. GI saw patient stating he had a recent EGD showing nonbleeding esophageal varices, nonbleeding AVM's. Out of concern for potential of active bleed, will initiate heparin infusion with lower goals. No bolus will be given.  HL 0.40 - therapeutic. Hb stable at 10.0, platelets in 90's.   Goal of Therapy:  Heparin level 0.3-0.5 units/ml Monitor platelets by anticoagulation protocol: Yes   Plan:  Continue heparin infusion at 900 units/hr Consider transitioning to DOAC  Daily HL, CBC  Cedric Fishman, PharmD, BCPS, BCCCP Clinical Pharmacist

## 2023-10-29 NOTE — Plan of Care (Signed)
  Problem: Education: Goal: Knowledge of General Education information will improve Description: Including pain rating scale, medication(s)/side effects and non-pharmacologic comfort measures Outcome: Progressing   Problem: Health Behavior/Discharge Planning: Goal: Ability to manage health-related needs will improve Outcome: Progressing   Problem: Clinical Measurements: Goal: Respiratory complications will improve Outcome: Progressing   

## 2023-10-29 NOTE — Progress Notes (Signed)
 Pharmacy Electrolyte Replacement  Recent Labs:  Recent Labs    10/29/23 0500  K 3.3*  MG 1.6*  PHOS 2.5  CREATININE 0.83    Low Critical Values (K </= 2.5, Phos </= 1, Mg </= 1) Present: None  MD Contacted: n/a  Plan: KCL PO 40 mEq x 2 doses, 4 g Mag Sulfate IV x1 per Elink protocol   Cedric Fishman, PharmD, BCPS, BCCCP Clinical Pharmacist,

## 2023-10-30 ENCOUNTER — Other Ambulatory Visit: Payer: Self-pay

## 2023-10-30 ENCOUNTER — Other Ambulatory Visit (HOSPITAL_COMMUNITY): Payer: Self-pay

## 2023-10-30 DIAGNOSIS — E43 Unspecified severe protein-calorie malnutrition: Secondary | ICD-10-CM | POA: Diagnosis not present

## 2023-10-30 DIAGNOSIS — I4891 Unspecified atrial fibrillation: Secondary | ICD-10-CM

## 2023-10-30 DIAGNOSIS — R578 Other shock: Secondary | ICD-10-CM

## 2023-10-30 DIAGNOSIS — E1010 Type 1 diabetes mellitus with ketoacidosis without coma: Secondary | ICD-10-CM | POA: Diagnosis not present

## 2023-10-30 LAB — BASIC METABOLIC PANEL
Anion gap: 7 (ref 5–15)
BUN: 9 mg/dL (ref 8–23)
CO2: 25 mmol/L (ref 22–32)
Calcium: 9.2 mg/dL (ref 8.9–10.3)
Chloride: 108 mmol/L (ref 98–111)
Creatinine, Ser: 0.81 mg/dL (ref 0.61–1.24)
GFR, Estimated: >60 mL/min (ref >60)
Glucose, Bld: 153 mg/dL — ABNORMAL HIGH (ref 70–99)
Potassium: 4 mmol/L (ref 3.5–5.1)
Sodium: 140 mmol/L (ref 135–145)

## 2023-10-30 LAB — CBC
HCT: 26.8 % — ABNORMAL LOW (ref 39.0–52.0)
Hemoglobin: 9 g/dL — ABNORMAL LOW (ref 13.0–17.0)
MCH: 35.6 pg — ABNORMAL HIGH (ref 26.0–34.0)
MCHC: 33.6 g/dL (ref 30.0–36.0)
MCV: 105.9 fL — ABNORMAL HIGH (ref 80.0–100.0)
Platelets: 94 10*3/uL — ABNORMAL LOW (ref 150–400)
RBC: 2.53 MIL/uL — ABNORMAL LOW (ref 4.22–5.81)
RDW: 17.5 % — ABNORMAL HIGH (ref 11.5–15.5)
WBC: 4.8 10*3/uL (ref 4.0–10.5)
nRBC: 0 % (ref 0.0–0.2)

## 2023-10-30 LAB — CULTURE, BLOOD (ROUTINE X 2)
Culture: NO GROWTH
Culture: NO GROWTH

## 2023-10-30 LAB — GLUCOSE, CAPILLARY
Glucose-Capillary: 328 mg/dL — ABNORMAL HIGH (ref 70–99)
Glucose-Capillary: 162 mg/dL — ABNORMAL HIGH (ref 70–99)
Glucose-Capillary: 207 mg/dL — ABNORMAL HIGH (ref 70–99)
Glucose-Capillary: 258 mg/dL — ABNORMAL HIGH (ref 70–99)
Glucose-Capillary: 59 mg/dL — ABNORMAL LOW (ref 70–99)
Glucose-Capillary: 128 mg/dL — ABNORMAL HIGH (ref 70–99)

## 2023-10-30 LAB — HEPARIN LEVEL (UNFRACTIONATED): Heparin Unfractionated: 0.3 [IU]/mL (ref 0.30–0.70)

## 2023-10-30 LAB — PHOSPHORUS: Phosphorus: 1.8 mg/dL — ABNORMAL LOW (ref 2.5–4.6)

## 2023-10-30 LAB — MAGNESIUM: Magnesium: 1.8 mg/dL (ref 1.7–2.4)

## 2023-10-30 MED ORDER — APIXABAN 5 MG PO TABS
5.0000 mg | ORAL_TABLET | Freq: Two times a day (BID) | ORAL | Status: DC
  Administered 2023-10-30 – 2023-11-03 (×9): 5 mg via ORAL
  Filled 2023-10-30 (×9): qty 1

## 2023-10-30 MED ORDER — POTASSIUM PHOSPHATES 15 MMOLE/5ML IV SOLN
30.0000 mmol | Freq: Once | INTRAVENOUS | Status: AC
  Administered 2023-10-30: 30 mmol via INTRAVENOUS
  Filled 2023-10-30: qty 10

## 2023-10-30 MED ORDER — MAGNESIUM SULFATE 2 GM/50ML IV SOLN
2.0000 g | Freq: Once | INTRAVENOUS | Status: AC
  Administered 2023-10-30: 2 g via INTRAVENOUS
  Filled 2023-10-30: qty 50

## 2023-10-30 NOTE — Progress Notes (Signed)
 Hypoglycemic Event  CBG: 59  Treatment: 4 oz juice/soda  Symptoms: None  Follow-up CBG: Time:2137 CBG Result:128  Possible Reasons for Event: Medication regimen:    Comments/MD notified: Notified S. Janalyn Shy, MD    Reginia Naas

## 2023-10-30 NOTE — [place] FL2 (Signed)
 [place] MEDICAID FL2 LEVEL OF CARE FORM     IDENTIFICATION  Patient Name: Tony Cantu Birthdate: August 16, 1951 Sex: male Admission Date (Current Location): 10/24/2023  [facility] and [place] Number:  [employer] and Address:  [facility], [address], [place], [place] [address]      Provider Number: [id number]  Attending Physician Name and Address:  Maretta Bees, MD  Relative Name and Phone Number:  Germaine Shenker; Relative; [phone]    Current Level of Care: Hospital Recommended Level of Care: Skilled Nursing Facility Prior Approval Number:    Date Approved/Denied:   PASRR Number: [id number]  Discharge Plan: SNF    Current Diagnoses: Patient Active Problem List   Diagnosis Date Noted   Atrial fibrillation with rapid ventricular response (HCC) 10/26/2023   Protein-calorie malnutrition, severe 10/25/2023   DKA (diabetic ketoacidosis) (HCC) 10/25/2023   GI bleed 10/24/2023   Hemorrhagic shock (HCC) 10/24/2023   Other cirrhosis of liver (HCC) 09/25/2023   Abnormal finding on GI tract imaging 09/25/2023   Esophageal candidiasis (HCC) 09/25/2023   Bacteremia 09/21/2023   Sepsis (HCC) 09/20/2023   Stroke-like symptom 09/20/2023   Acute encephalopathy 09/20/2023   Transaminitis 09/20/2023   Renal lesion 09/20/2023   Melena 09/20/2023   Iron deficiency anemia due to chronic blood loss 08/16/2023   NAFLD (nonalcoholic fatty liver disease) 32/95/1884   Portal hypertension (HCC) 08/24/2022   S/P total knee arthroplasty, left 06/06/2022   Degenerative arthritis of left knee 06/03/2022   Chronic obstructive pulmonary disease (HCC) 05/10/2022   Grade I diastolic dysfunction 03/11/2022   Postoperative fever 03/10/2022   S/P TKR (total knee replacement), right 03/07/2022   Osteoarthritis of right knee 03/04/2022   Precordial chest pain 04/20/2021   Former smoker 01/12/2021   PAF (paroxysmal atrial fibrillation) (HCC) 06/22/2020    Hypertension associated with type 2 diabetes mellitus (HCC) 05/17/2019   Hyperlipidemia associated with type 2 diabetes mellitus (HCC) 05/17/2019   GERD with esophagitis 05/17/2019   H/O total shoulder replacement, right 11/01/2018   Paroxysmal tachycardia (HCC) 01/11/2017   Nonrheumatic aortic valve stenosis 01/11/2017   Aortic atherosclerosis (HCC) 11/23/2016   HNP (herniated nucleus pulposus), lumbar 10/06/2016   Acute renal injury (HCC) 08/06/2016   Diarrhea 08/06/2016   Fever 08/06/2016   Hyperbilirubinemia 08/06/2016   Shock circulatory (HCC) 08/06/2016   Lactic acidosis 08/06/2016   Inguinal hernia 12/10/2015   Right groin pain 11/30/2015   Carpal tunnel syndrome on right 09/09/2015   Cervical spondylosis without myelopathy 09/09/2015   Thrombocytopenia (HCC) 07/21/2014   Bilateral carotid bruits 05/11/2014   Vitamin D deficiency 09/17/2013   BPH (benign prostatic hyperplasia) 05/22/2013   Low serum testosterone level 02/18/2013   Diabetes type 2, controlled (HCC) 01/10/2013   OSA (obstructive sleep apnea) 05/16/2012   Edema 02/21/2012   At risk for coronary artery disease 03/20/2011   Obesity 03/20/2011   Allergic rhinitis 06/22/2010   ASTHMA 06/22/2010   COUGH 06/22/2010    Orientation RESPIRATION BLADDER Height & Weight     Self, Situation, Place  Normal (Room Air) Incontinent, External catheter Weight: 195 lb 15.8 oz (88.9 kg) Height:     BEHAVIORAL SYMPTOMS/MOOD NEUROLOGICAL BOWEL NUTRITION STATUS    Convulsions/Seizures (History of seizures) Continent Diet (Please see dc summary)  AMBULATORY STATUS COMMUNICATION OF NEEDS Skin   Extensive Assist Verbally Normal  Personal Care Assistance Level of Assistance  Bathing, Feeding, Dressing Bathing Assistance: Maximum assistance Feeding assistance: Limited assistance Dressing Assistance: Maximum assistance     Functional Limitations Info  Sight Sight Info: Impaired (R and L  (Eyeglasses))        SPECIAL CARE FACTORS FREQUENCY  PT (By licensed PT), OT (By licensed OT)     PT Frequency: 5x OT Frequency: 5x            Contractures Contractures Info: Not present    Additional Factors Info  Code Status, Allergies, Insulin Sliding Scale Code Status Info: Full Code Allergies Info: [employer] Children[facility] Cold-cough; Erythromycin; Sulfonamide Derivatives   Insulin Sliding Scale Info: Please see dc summary       Current Medications (10/30/2023):  This is the current hospital active medication list Current Facility-Administered Medications  Medication Dose Route Frequency Provider Last Rate Last Admin   acetaminophen (TYLENOL) tablet 650 mg  650 mg Oral Q4H PRN Rana Snare, DO       amiodarone (PACERONE) tablet 400 mg  400 mg Oral BID Hunsucker, Lesia Sago, MD   400 mg at 10/30/23 9528   Followed by   Melene Muller ON 11/03/2023] amiodarone (PACERONE) tablet 400 mg  400 mg Oral Daily Hunsucker, Lesia Sago, MD       Followed by   Melene Muller ON 11/08/2023] amiodarone (PACERONE) tablet 200 mg  200 mg Oral Daily Hunsucker, Lesia Sago, MD       atorvastatin (LIPITOR) tablet 40 mg  40 mg Oral Daily Rana Snare, DO   40 mg at 10/30/23 4132   Chlorhexidine Gluconate Cloth 2 % PADS 6 each  6 each Topical Daily Charlott Holler, MD   6 each at 10/30/23 0831   dextrose 50 % solution 0-50 mL  0-50 mL Intravenous PRN Rana Snare, DO       docusate sodium (COLACE) capsule 100 mg  100 mg Oral BID Rana Snare, DO   100 mg at 10/30/23 4401   feeding supplement (GLUCERNA SHAKE) (GLUCERNA SHAKE) liquid 237 mL  237 mL Oral TID BM Raechel Chute, MD   237 mL at 10/30/23 0831   heparin ADULT infusion 100 units/mL (25000 units/237mL)  900 Units/hr Intravenous Continuous Dgayli, Lianne Bushy, MD 9 mL/hr at 10/29/23 1500 900 Units/hr at 10/29/23 1500   insulin aspart (novoLOG) injection 0-20 Units  0-20 Units Subcutaneous TID WC Rana Snare, DO   4 Units at 10/30/23 0825   insulin aspart  (novoLOG) injection 0-5 Units  0-5 Units Subcutaneous QHS Rana Snare, DO   4 Units at 10/28/23 2138   insulin aspart (novoLOG) injection 12 Units  12 Units Subcutaneous TID WC Hunsucker, Lesia Sago, MD   12 Units at 10/30/23 0825   insulin glargine (LANTUS) injection 25 Units  25 Units Subcutaneous BID Hunsucker, Lesia Sago, MD   25 Units at 10/30/23 0272   magnesium sulfate IVPB 2 g 50 mL  2 g Intravenous Once Maretta Bees, MD 50 mL/hr at 10/30/23 0829 2 g at 10/30/23 0829   montelukast (SINGULAIR) tablet 10 mg  10 mg Oral QHS Rana Snare, DO   10 mg at 10/29/23 2152   multivitamin with minerals tablet 1 tablet  1 tablet Oral Daily Charlott Holler, MD   1 tablet at 10/30/23 5366   naphazoline-glycerin (CLEAR EYES REDNESS) ophth solution 1-2 drop  1-2 drop Both Eyes QID PRN Rana Snare, DO   2 drop at 10/28/23 0510   pantoprazole (PROTONIX) EC tablet 40 mg  40 mg Oral BID Rana Snare, DO   40 mg at 10/30/23 0981   polyethylene glycol (MIRALAX / GLYCOLAX) packet 17 g  17 g Oral Daily Rana Snare, DO   17 g at 10/30/23 1914   potassium PHOSPHATE 30 mmol in dextrose 5 % 500 mL infusion  30 mmol Intravenous Once Maretta Bees, MD       sodium chloride flush (NS) 0.9 % injection 10-40 mL  10-40 mL Intracatheter Q12H Charlott Holler, MD   10 mL at 10/29/23 2152   sodium chloride flush (NS) 0.9 % injection 10-40 mL  10-40 mL Intracatheter PRN Charlott Holler, MD       sodium chloride flush (NS) 0.9 % injection 10-40 mL  10-40 mL Intracatheter Q12H Charlott Holler, MD   10 mL at 10/29/23 2152   sodium chloride flush (NS) 0.9 % injection 10-40 mL  10-40 mL Intracatheter PRN Charlott Holler, MD         Discharge Medications: Please see discharge summary for a list of discharge medications.  Relevant Imaging Results:  Relevant Lab Results:   Additional Information [id number]  Mearl Latin, LCSW

## 2023-10-30 NOTE — Progress Notes (Signed)
 Nutrition Follow-up  DOCUMENTATION CODES:   Severe malnutrition in context of chronic illness  INTERVENTION:  Liberalize diet. Continue with; Ensure Enlive po TID, each supplement provides 350 kcal and 20 grams of protein, discussed with provider.   Encourage PO intake when awake and alert   MVI with minerals daily   Monitor magnesium and phosphorus daily until normalized, MD to replete as needed, as pt is at risk for refeeding syndrome given hx of severe malnutrition.   NUTRITION DIAGNOSIS:   Severe Malnutrition related to chronic illness (cirrhosis) as evidenced by severe fat depletion, severe muscle depletion.    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 Pt now admitted from SNF for DKA, AKI, and possible GI Bleed. Patient noted to have good oral intake of meals.  Last documented Bm 10/24/23,  No chewing or swallowing concern noted at this time.  Continues to have generalized mild edema BLE.  There is no benefit to excessive dietary restrictions related advanced age, increased nutrient needs. Patient would benefit better from liberalized diet to help meet increased nutrient needs and promote better oral intake.  Significant events: 2/25>>transfer from [facility] for hematemesis c/f GI bleed and hypotensive needing levophed 2/26>> started on precedex overnight due to agitation, GI signed off 2/27>> Afib RVR on EKG, started amio infusion, converted to sinus  2/28>> Afib RVR, gave additional amio bolus, converted to  sinus 2/29>> off levophed 3/03>> transferred to [facility]  Weight history;  10/30/23 88.9 kg  09/20/23 92.4 kg  09/13/23 95.2 kg  09/08/23 96.2 kg  08/16/23 97.6 kg  07/21/23 95.7 kg  07/13/23 98.2 kg  05/31/23 96.6 kg  05/16/23 98.7 kg  01/18/23 51.7 kg   Hospital weight history: 10/30/23 0500 88.9 kg 195.99 lbs  10/29/23 0710 89.6 kg 197.53 lbs  10/28/23 0500 84.8 kg 186.95 lbs  10/27/23 0500 84.8 kg 186.95 lbs  10/26/23 0500 84.8 kg 186.95 lbs  10/24/23 1709 81.3 kg 179.23 lb     Average Meal Intake: 10-100: 69% intake x 8 recorded meals  Nutritionally Relevant Medications: Scheduled Meds:  docusate sodium  100 mg Oral BID   feeding supplement (GLUCERNA SHAKE)  237 mL Oral TID BM   insulin aspart  0-20 Units Subcutaneous TID WC   insulin aspart  0-5 Units Subcutaneous QHS   insulin aspart  12 Units Subcutaneous TID WC   insulin glargine  25 Units Subcutaneous BID   multivitamin with minerals  1 tablet Oral Daily    Continuous Infusions:  heparin 900 Units/hr (10/29/23 1500)   magnesium sulfate bolus IVPB 2 g (10/30/23 0829)   potassium PHOSPHATE IVPB (in mmol)       Labs Reviewed: CBG ranges from 249-153 mg/dL over the last 24 hours     NUTRITION - FOCUSED PHYSICAL EXAM:  Flowsheet Row Most Recent Value  Orbital Region Severe depletion  Upper Arm Region Moderate depletion  Thoracic and Lumbar Region Mild depletion  Buccal Region Severe depletion  Temple Region Severe depletion  Clavicle Bone Region Severe depletion  Clavicle and Acromion Bone Region  Severe depletion  Scapular Bone Region Severe depletion  Dorsal Hand Moderate depletion  Patellar Region Severe depletion  Anterior Thigh Region Severe depletion  Posterior Calf Region Severe depletion  Edema (RD Assessment) None  Hair Reviewed  Eyes Unable to assess  Mouth Unable to assess  Skin Reviewed  [dry]  Nails Reviewed       Diet Order:   Diet Order             Diet Carb Modified Fluid  consistency: Thin; Room service appropriate? Yes  Diet effective now                   EDUCATION NEEDS:   Not appropriate for education at this time  Skin:  Skin Assessment: Reviewed RN Assessment  Last BM:  2/25  Height:   Ht Readings from Last 1 Encounters:  09/20/23 5\' 8"  (1.727 m)    Weight:   Wt Readings from Last 1 Encounters:  10/30/23 88.9 kg    Ideal Body Weight:     BMI:  Body mass index is 29.8 kg/m.  Estimated Nutritional Needs:   Kcal:  2000-2200  Protein:  120-140 grams  Fluid:  >1.9 L/day    Jamelle Haring RDN, LDN Clinical Dietitian   If unable to reach, please contact "RD Inpatient" secure chat group between 8 am-4 pm daily"

## 2023-10-30 NOTE — Progress Notes (Addendum)
 PROGRESS NOTE        PATIENT DETAILS Name: Tony Cantu Age: 73 y.o. Sex: male Date of Birth: 1951/08/01 Admit Date: 10/24/2023 Admitting Physician Charlott Holler, MD [id number], Rozell Searing, DO  Brief Summary: Patient is a 73 y.o.  male with history of PAF, severe aortic stenosis, liver cirrhosis, DM-1, seizure disorder, OSA on CPAP-recent hospitalization from 1/21-1/09 for sepsis secondary to Streptococcus bacteremia-transferred to [facility] for evaluation of hypotension-patient was found to have AKI-possible GI bleeding-she was subsequently admitted to the ICU-have stabilized and transferred to [facility] services.  See below for further  Significant events: 2/25>>transfer from [facility] for hematemesis c/f GI bleed and hypotensive needing levophed 2/26>> started on precedex overnight due to agitation, GI signed off 2/27>> Afib RVR on EKG, started amio infusion, converted to sinus  2/28>> Afib RVR, gave additional amio bolus, converted to sinus 2/29>> off levophed 3/03>> transferred to [facility]  Significant studies: 2/25>> CXR: No PNA.  Significant microbiology data: 2/26>> blood: No growth  Procedures: None  Consults: PCCM GI  Subjective: Lying comfortably in bed-denies any chest pain or shortness of breath.  Objective: Vitals: Blood pressure 122/74, pulse 92, temperature 98.3 F (36.8 C), temperature source Oral, resp. rate 18, weight 88.9 kg, SpO2 94%.   Exam: Gen Exam:Alert awake-not in any distress HEENT:atraumatic, normocephalic Chest: B/L clear to auscultation anteriorly CVS:S1S2 regular Abdomen:soft non tender, non distended Extremities:no edema Neurology: Non focal Skin: no rash  Pertinent Labs/Radiology:    Latest Ref Rng & Units 10/30/2023    3:28 AM 10/29/2023    5:00 AM 10/28/2023    4:36 AM  CBC  WBC 4.0 - 10.5 K/uL 4.8  4.2  4.2   Hemoglobin 13.0 - 17.0 g/dL 9.0  11.9  14.7   Hematocrit 39.0 - 52.0 % 26.8  29.7  29.4    Platelets 150 - 400 K/uL 94  96  89     Lab Results  Component Value Date   NA 140 10/30/2023   K 4.0 10/30/2023   CL 108 10/30/2023   CO2 25 10/30/2023     Assessment/Plan: Shock. Hypotension felt to be due to hypovolemia in the setting of DKA Acute blood loss/hemorrhagic shock/sepsis ruled out Briefly required pressors while in the ICU. BP now stable  DKA Treated with IV fluids/IV insulin-has been transition to SQ insulin  DM-1 (A1c 4.4 on 1/21) CBG stable Lantus 25 units twice daily+ NovoLog 12 units with meals+ SSI Follows-optimize.  Recent Labs    10/29/23 1828 10/29/23 2126 10/30/23 0811  GLUCAP 249* 172* 162*     PAF with RVR Rate controlled with amiodarone-now in sinus rhythm On heparin infusion-suspect can be transition to Eliquis  AKI Etiology likely hemodynamically mediated/osmotic mediated diuresis Creatinine has normalized with supportive care.  Severe aortic stenosis Known issue Outpatient cardiology follow-up when he recovers from this acute illness  HTN BP-soft-not on any antihypertensives  HLD Statin  Hypophosphatemia Replete/recheck  Cirrhosis/portal hypertension Relatively stable/compensated Outpatient GI follow-up.  Thrombocytopenia Mild Follow periodically Could be due to acute illness or underlying cirrhosis.  Macrocytic anemia Likely due to acute illness superimposed on anemia of chronic disease. Monitor Hb-no evidence of acute bleeding   Recent history of streptococcal bacteremia Seems to have completed IV Rocephin-end of treatment was on 2/19.  History of upper GI AVMs No evidence of GI bleeding during this  hospitalization-Hb stable Evaluated by GI and not felt to need any further endoscopy evaluation-has had EGD January 2025 (esophageal candidiasis/gastric AVMs/GAVE) and colonoscopy February 2024 with removal of 4 polyps and tubular adenoma/hyperplastic polyps)  Debility/deconditioning PT/OT-SNF plan.  Nutrition  Status: Nutrition Problem: Severe Malnutrition Etiology: chronic illness (cirrhosis) Signs/Symptoms: severe fat depletion, severe muscle depletion Interventions: Refer to RD note for recommendations  BMI: Estimated body mass index is 29.8 kg/m as calculated from the following:   Height as of 09/20/23: 5\' 8"  (1.727 m).   Weight as of this encounter: 88.9 kg.   Code status:   Code Status: Full Code   DVT Prophylaxis: SCDs Start: 10/24/23 1735 IV Heparin   Family Communication: Ex wife-[name] [phone] updated over the phone 3/3  Disposition Plan: Status is: Inpatient Remains inpatient appropriate because: Severity of illness   Planned Discharge Destination:Skilled nursing facility   Diet: Diet Order             Diet Carb Modified Fluid consistency: Thin; Room service appropriate? Yes  Diet effective now                     Antimicrobial agents: Anti-infectives (From admission, onward)    Start     Dose/Rate Route Frequency Ordered Stop   10/24/23 1845  cefTRIAXone (ROCEPHIN) 2 g in sodium chloride 0.9 % 100 mL IVPB  Status:  Discontinued        2 g 200 mL/hr over 30 Minutes Intravenous Every 24 hours 10/24/23 1751 10/26/23 1001        MEDICATIONS: Scheduled Meds:  amiodarone  400 mg Oral BID   Followed by   Melene Muller ON 11/03/2023] amiodarone  400 mg Oral Daily   Followed by   Melene Muller ON 11/08/2023] amiodarone  200 mg Oral Daily   atorvastatin  40 mg Oral Daily   Chlorhexidine Gluconate Cloth  6 each Topical Daily   docusate sodium  100 mg Oral BID   feeding supplement (GLUCERNA SHAKE)  237 mL Oral TID BM   insulin aspart  0-20 Units Subcutaneous TID WC   insulin aspart  0-5 Units Subcutaneous QHS   insulin aspart  12 Units Subcutaneous TID WC   insulin glargine  25 Units Subcutaneous BID   montelukast  10 mg Oral QHS   multivitamin with minerals  1 tablet Oral Daily   pantoprazole  40 mg Oral BID   polyethylene glycol  17 g Oral Daily   sodium  chloride flush  10-40 mL Intracatheter Q12H   sodium chloride flush  10-40 mL Intracatheter Q12H   Continuous Infusions:  heparin 900 Units/hr (10/29/23 1500)   potassium PHOSPHATE IVPB (in mmol) 30 mmol (10/30/23 0947)   PRN Meds:.acetaminophen, dextrose, naphazoline-glycerin, sodium chloride flush, sodium chloride flush   I have personally reviewed following labs and imaging studies  LABORATORY DATA: CBC: Recent Labs  Lab 10/25/23 0659 10/25/23 1235 10/26/23 0438 10/27/23 0444 10/28/23 0436 10/29/23 0500 10/30/23 0328  WBC 6.9  --   --  4.5 4.2 4.2 4.8  HGB 10.1*   < > 10.7* 9.9* 10.0* 10.0* 9.0*  HCT 29.4*   < > 31.8* 28.9* 29.4* 29.7* 26.8*  MCV 103.2*  --   --  102.1* 102.4* 103.5* 105.9*  PLT 131*  --   --  87* 89* 96* 94*   < > = values in this interval not displayed.    Basic Metabolic Panel: Recent Labs  Lab 10/26/23 0335 10/26/23 2028 10/27/23 0444 10/28/23 0436  10/29/23 0500 10/30/23 0328  NA 137 133* 133* 134* 137 140  K 3.8 3.2* 3.4* 3.4* 3.3* 4.0  CL 103 98 98 101 105 108  CO2 24 26 24 27 25 25   GLUCOSE 229* 186* 182* 192* 249* 153*  BUN 11 9 9 9 8 9   CREATININE 0.92 0.88 0.82 0.78 0.83 0.81  CALCIUM 8.7* 8.2* 8.4* 8.2* 8.4* 9.2  MG 1.6*  --  1.7 1.7 1.6* 1.8  PHOS 1.8*  --  1.8* 2.2* 2.5 1.8*    GFR: Estimated Creatinine Clearance: 86.7 mL/min (by C-G formula based on SCr of 0.81 mg/dL).  Liver Function Tests: Recent Labs  Lab 10/24/23 1744  AST 42*  ALT 40  ALKPHOS 123  BILITOT 1.5*  PROT 6.8  ALBUMIN 2.5*   No results for input(s): "LIPASE", "AMYLASE" in the last 168 hours. Recent Labs  Lab 10/25/23 1420  AMMONIA 32    Coagulation Profile: Recent Labs  Lab 10/24/23 2053  INR 1.2    Cardiac Enzymes: No results for input(s): "CKTOTAL", "CKMB", "CKMBINDEX", "TROPONINI" in the last 168 hours.  BNP (last 3 results) No results for input(s): "PROBNP" in the last 8760 hours.  Lipid Profile: No results for input(s):  "CHOL", "HDL", "LDLCALC", "TRIG", "CHOLHDL", "LDLDIRECT" in the last 72 hours.  Thyroid Function Tests: No results for input(s): "TSH", "T4TOTAL", "FREET4", "T3FREE", "THYROIDAB" in the last 72 hours.  Anemia Panel: No results for input(s): "VITAMINB12", "FOLATE", "FERRITIN", "TIBC", "IRON", "RETICCTPCT" in the last 72 hours.  Urine analysis:    Component Value Date/Time   COLORURINE YELLOW 09/19/2023 2040   APPEARANCEUR CLEAR 09/19/2023 2040   APPEARANCEUR Clear 07/05/2021 1129   LABSPEC 1.015 09/19/2023 2040   PHURINE 6.0 09/19/2023 2040   GLUCOSEU NEGATIVE 09/19/2023 2040   GLUCOSEU NEGATIVE 04/22/2021 1048   HGBUR NEGATIVE 09/19/2023 2040   BILIRUBINUR NEGATIVE 09/19/2023 2040   BILIRUBINUR Negative 07/05/2021 1129   KETONESUR 20 (A) 09/19/2023 2040   PROTEINUR NEGATIVE 09/19/2023 2040   UROBILINOGEN 0.2 04/22/2021 1048   NITRITE NEGATIVE 09/19/2023 2040   LEUKOCYTESUR NEGATIVE 09/19/2023 2040    Sepsis Labs: Lactic Acid, Venous    Component Value Date/Time   LATICACIDVEN 1.9 10/26/2023 2254    MICROBIOLOGY: Recent Results (from the past 240 hours)  MRSA Next Gen by PCR, Nasal     Status: None   Collection Time: 10/24/23  5:09 PM   Specimen: Nasal Mucosa; Nasal Swab  Result Value Ref Range Status   MRSA by PCR Next Gen NOT DETECTED NOT DETECTED Final    Comment: (NOTE) The GeneXpert MRSA Assay (FDA approved for NASAL specimens only), is one component of a comprehensive MRSA colonization surveillance program. It is not intended to diagnose MRSA infection nor to guide or monitor treatment for MRSA infections. Test performance is not FDA approved in patients less than 44 years old. Performed at [facility], [address] [place], [place] [address]   Culture, blood (Routine X 2) w Reflex to ID Panel     Status: None (Preliminary result)   Collection Time: 10/25/23  2:59 PM   Specimen: BLOOD RIGHT HAND  Result Value Ref Range Status   Specimen  Description BLOOD RIGHT HAND  Final   Special Requests   Final    BOTTLES DRAWN AEROBIC ONLY Blood Culture results may not be optimal due to an inadequate volume of blood received in culture bottles   Culture   Final    NO GROWTH 4 DAYS Performed at  [facility], [address] [place] [place], [place] [address]    Report Status PENDING  Incomplete  Culture, blood (Routine X 2) w Reflex to ID Panel     Status: None (Preliminary result)   Collection Time: 10/25/23  3:00 PM   Specimen: BLOOD  Result Value Ref Range Status   Specimen Description BLOOD RIGHT ANTECUBITAL  Final   Special Requests   Final    BOTTLES DRAWN AEROBIC AND ANAEROBIC Blood Culture results may not be optimal due to an inadequate volume of blood received in culture bottles   Culture   Final    NO GROWTH 4 DAYS Performed at [facility], [address], [place], [place] [address]    Report Status PENDING  Incomplete    RADIOLOGY STUDIES/RESULTS: No results found.   LOS: 6 days   Jeoffrey Massed, MD  [facility]    To contact the attending provider between 7A-7P or the covering provider during after hours 7P-7A, please log into the web site [email or web address] and access using universal [place] password for that web site. If you do not have the password, please call the hospital operator.  10/30/2023, 10:20 AM

## 2023-10-30 NOTE — Consult Note (Signed)
 [facility] Liaison Consult Note   10/30/2023  Tony Cantu Jun 08, 1951 [id number]  Insurance: Valinda Hoar [facility] [place]  Primary Care Provider: [name], DO with [facility], this provider is listed for the transition of care follow up appointments  and [facility] calls   [facility] Liaison met patient at bedside at [facility]  Patient just returning to room and up in bedside recliner.  Explained reason for visit for post hospital follow up.  Patient states he is planning for returning [place]hab [place] [facility]. He endorses PCP.   The patient was screened for 30 day readmission hospitalization with noted high risk score for unplanned readmission risk 2 hospital admissions in 6 months.  The patient was assessed for potential [facility] Coordination service needs for post hospital transition for care coordination. Review of patient's electronic medical record reveals patient is being recommended for a skilled nursing facility level of care for post hospital transition and needs are to be met there.  Plan: [facility] Liaison will continue to follow progress and disposition to asess for post hospital community care coordination/management needs.  Referral request for community care coordination: anticipate SNF for rehab and no current referral planned.   [facility], Population Health does not replace or interfere with any arrangements made by the Inpatient Transition of Care team.   For questions contact:   Charlesetta Shanks, RN, BSN, CCM [place]  [facility], [facility] Liaison Direct Dial: [phone] or secure chat Email: [email or web address]

## 2023-10-30 NOTE — Care Management Important Message (Signed)
 Important Message  Patient Details  Name: Tony Cantu MRN: [id number] Date of Birth: [birth year]   Important Message Given:  Yes - Medicare IM     Dorena Bodo 10/30/2023, 4:08 PM

## 2023-10-30 NOTE — Progress Notes (Signed)
 Physical Therapy Treatment Patient Details Name: Tony Cantu MRN: [id number] DOB: [birth year] Today's Date: 10/30/2023   History of Present Illness 73 y.o. male admitted by transfer via EMS 2/25 from [facility] from [facility] ED for syncope. Dx: Shock, likely hypovolemic and DKA. PMHx:  type 1 diabetes, A-fib on Eliquis, cirrhosis, seizure disorder, aortic stenosis, hypertension, peptic ulcer disease, OA with bilateral knee replacement, polyclonal gammopathy, OSA on CPAP.    PT Comments  Progressing towards functional goals. Required min assist for bed mobility to rise and bring LEs to dependent position, difficulty sequencing and initiating. BP 94/59 seated EOB + dizziness reported. Mod assist with RW during sit to stand and step pivot transfer to recliner, heavy retropulsion and high fear of falling. Requires max cues for technique. BP improved to 124/76 in recliner with LEs elevated. Patient will benefit from continued inpatient follow up therapy, <3 hours/day. Patient will continue to benefit from skilled physical therapy services to further improve independence with functional mobility.    If plan is discharge home, recommend the following: Two people to help with walking and/or transfers;A lot of help with bathing/dressing/bathroom;Assistance with cooking/housework;Direct supervision/assist for medications management;Direct supervision/assist for financial management;Assist for transportation;Help with stairs or ramp for entrance;Supervision due to cognitive status   Can travel by private vehicle     No  Equipment Recommendations  None recommended by PT    Recommendations for Other Services       Precautions / Restrictions Precautions Precautions: Fall;Other (comment) Recall of Precautions/Restrictions: Impaired Precaution/Restrictions Comments: Monitor BP; Lt basilic picc line Restrictions Weight Bearing Restrictions Per Provider Order: No     Mobility  Bed  Mobility Overal bed mobility: Needs Assistance Bed Mobility: Supine to Sit     Supine to sit: Used rails, Min assist, HOB elevated     General bed mobility comments: Cues for rail use. Difficutly sequencing and initiating initially. Min assist to bring LEs to EOB, and facilitate trunk upright on EOB.    Transfers Overall transfer level: Needs assistance Equipment used: Rolling walker (2 wheels) Transfers: Sit to/from Stand, Bed to chair/wheelchair/BSC Sit to Stand: Mod assist   Step pivot transfers: Mod assist, From elevated surface       General transfer comment: Mod assist for boost to stand with heavy retropulsion. RW for support. Able to gradually adjust weight forward with max cues and techniques to facilitate. High fear of falling. Performed from bed x2 and recliner x1. Mod assist to facilitate steps to recliner, and posterior support to facilitate forward weight shift within RW.    Ambulation/Gait                   Stairs             Wheelchair Mobility     Tilt Bed    Modified Rankin (Stroke Patients Only)       Balance Overall balance assessment: Needs assistance Sitting-balance support: Feet supported, Single extremity supported Sitting balance-Leahy Scale: Poor   Postural control: Right lateral lean Standing balance support: Bilateral upper extremity supported, Reliant on assistive device for balance Standing balance-Leahy Scale: Poor                              Communication Communication Communication: No apparent difficulties  Cognition Arousal: Alert Behavior During Therapy: WFL for tasks assessed/performed   PT - Cognitive impairments: No family/caregiver present to determine baseline, Memory, Initiation, Sequencing, Problem  solving, Awareness                       PT - Cognition Comments: Recalls year, DOB, can pick accurate month with options only. Following commands: Impaired Following commands  impaired: Follows one step commands with increased time, Follows one step commands inconsistently    Cueing Cueing Techniques: Verbal cues, Tactile cues  Exercises      General Comments General comments (skin integrity, edema, etc.): BP seated 94/59 + dizziness. Reclined in chair BP 124/76. HR 85 Spo2 98% on RA.      Pertinent Vitals/Pain Pain Assessment Pain Assessment: Faces Faces Pain Scale: Hurts little more Pain Location: R hip Pain Descriptors / Indicators: Aching, Guarding Pain Intervention(s): Monitored during session, Repositioned    Home Living Family/patient expects to be discharged to:: Skilled nursing facility Living Arrangements: Spouse/significant other Available Help at Discharge: Family;Available 24 hours/day Type of Home: House Home Access: Stairs to enter Entrance Stairs-Rails: None Entrance Stairs-Number of Steps: 1+1   Home Layout: Able to live on main level with bedroom/bathroom Home Equipment: Agricultural consultant (2 wheels);Cane - single point;Standard Walker;BSC/3in1 Additional Comments: Pt poor historian. Family wasn't present to confirm home living    Prior Function            PT Goals (current goals can now be found in the care plan section) Acute Rehab PT Goals Patient Stated Goal: Get well PT Goal Formulation: With patient Time For Goal Achievement: 11/09/23 Potential to Achieve Goals: Good Progress towards PT goals: Progressing toward goals    Frequency    Min 1X/week      PT Plan      Co-evaluation              AM-PAC PT "6 Clicks" Mobility   Outcome Measure  Help needed turning from your back to your side while in a flat bed without using bedrails?: A Lot Help needed moving from lying on your back to sitting on the side of a flat bed without using bedrails?: A Lot Help needed moving to and from a bed to a chair (including a wheelchair)?: A Lot Help needed standing up from a chair using your arms (e.g., wheelchair or bedside  chair)?: A Lot Help needed to walk in hospital room?: Total Help needed climbing 3-5 steps with a railing? : Total 6 Click Score: 10    End of Session Equipment Utilized During Treatment: Gait belt Activity Tolerance: Patient tolerated treatment well Patient left: in chair;with call bell/phone within reach;with chair alarm set (Tr) Nurse Communication: Mobility status;Other (comment);Need for lift equipment (Stedy or +2 with RW pivot (Leans backwards)) PT Visit Diagnosis: Muscle weakness (generalized) (M62.81);Difficulty in walking, not elsewhere classified (R26.2);History of falling (Z91.81);Other symptoms and signs involving the nervous system (R29.898);Dizziness and giddiness (R42);Pain Pain - Right/Left: Right Pain - part of body: Hip     Time: 1040-1102 PT Time Calculation (min) (ACUTE ONLY): 22 min  Charges:    $Therapeutic Activity: 8-22 mins PT General Charges $$ ACUTE PT VISIT: 1 Visit                     Kathlyn Sacramento, PT, DPT [facility]  Rehabilitation Services Physical Therapist Office: [phone] Website: [email or web address]    Berton Mount 10/30/2023, 11:42 AM

## 2023-10-30 NOTE — TOC Progression Note (Addendum)
 Transition of Care [facility]) - Progression Note    Patient Details  Name: Tony Cantu MRN: [id number] Date of Birth: 01/23/1951  Transition of Care [facility] CM/SW Contact  Mearl Latin, LCSW Phone Number: 10/30/2023, 12:29 PM  Clinical Narrative:    12pm-CSW spoke with patient's HCPOA/ex-wife, [name], who shared his current wife serviced him divorce papers at [facility]. She requested CSW check into [facility]. CSW presented other bed offers and she reported patient's BCBS is primary over Medicare (it is through spouse's job). She will also consider patient returning to [place]. CSW went over Medicaid process if patient ends up needing long term care. She stated he would need to transfer on his own for her to bring him to her house.   2:47 PM-CSW left voicemail for [name] to make her aware that [facility] does not have any beds available.    4:27 PM-CSW attempted [name] again but no answer.   Expected Discharge Plan: Skilled Nursing Facility Barriers to Discharge: Insurance Authorization, SNF Pending bed offer  Expected Discharge Plan and Services In-house Referral: Clinical Social Work   Post Acute Care Choice: Skilled Nursing Facility Living arrangements for the past 2 months: Single Family Home, Skilled Nursing Facility                                       Social Determinants of Health (SDOH) Interventions SDOH Screenings   Food Insecurity: Patient Unable To Answer (09/20/2023)  Housing: Low Risk  (09/22/2023)  Transportation Needs: Patient Unable To Answer (09/20/2023)  Utilities: Patient Unable To Answer (09/20/2023)  Depression (PHQ2-9): Medium Risk (09/13/2023)  Financial Resource Strain: Low Risk  (06/09/2022)  Social Connections: Patient Unable To Answer (09/20/2023)  Tobacco Use: Medium Risk (10/23/2023)   Received from [facility]    Readmission Risk Interventions     No data to display

## 2023-10-30 NOTE — Progress Notes (Signed)
 PHARMACY - ANTICOAGULATION CONSULT NOTE  Pharmacy Consult for heparin Indication: atrial fibrillation  Allergies  Allergen Reactions   Dimetapp Children's Cold-Cough Other (See Comments)    Chest discomfort    Erythromycin Diarrhea   Sulfonamide Derivatives Diarrhea    Patient Measurements: Weight: 88.9 kg (195 lb 15.8 oz) Heparin Dosing Weight: 84.8 kg  Vital Signs: Temp: 98.3 F (36.8 C) (03/03 0800) Temp Source: Oral (03/03 0800) BP: 122/74 (03/03 0800) Pulse Rate: 92 (03/03 0800)  Labs: Recent Labs    10/28/23 0436 10/29/23 0500 10/30/23 0328  HGB 10.0* 10.0* 9.0*  HCT 29.4* 29.7* 26.8*  PLT 89* 96* 94*  HEPARINUNFRC 0.50 0.40 0.30  CREATININE 0.78 0.83 0.81    Estimated Creatinine Clearance: 86.7 mL/min (by C-G formula based on SCr of 0.81 mg/dL).    Assessment: 26 YOM w/ PMH that includes AF on apixaban PTA. Last dose taken 2/24 at 0800 He was admitted w/ hematemasis c/g GIB. GI saw patient stating he had a recent EGD showing nonbleeding esophageal varices, nonbleeding AVM's. Out of concern for potential of active bleed, will initiate heparin infusion with lower goals. No bolus will be given.  HL 0.3  Goal of Therapy:  Heparin level 0.3-0.5 units/ml Monitor platelets by anticoagulation protocol: Yes   Plan:  Continue heparin infusion at 900 units/hr Follow up transitioning to DOAC  Daily HL, CBC  Thank you Okey Regal, PharmD

## 2023-10-30 NOTE — Plan of Care (Signed)
   Problem: Education: Goal: Knowledge of General Education information will improve Description: Including pain rating scale, medication(s)/side effects and non-pharmacologic comfort measures Outcome: Progressing   Problem: Health Behavior/Discharge Planning: Goal: Ability to manage health-related needs will improve Outcome: Progressing   Problem: Clinical Measurements: Goal: Will remain free from infection Outcome: Progressing Goal: Diagnostic test results will improve Outcome: Progressing Goal: Respiratory complications will improve Outcome: Progressing   Problem: Coping: Goal: Level of anxiety will decrease Outcome: Progressing

## 2023-10-30 NOTE — Evaluation (Signed)
 Occupational Therapy Evaluation Patient Details Name: Tony Cantu MRN: [id number] DOB: 1950/11/05 Today's Date: 10/30/2023   History of Present Illness   73 y.o. male admitted by transfer via EMS 2/25 from [facility] from [facility] ED for syncope. Dx: Shock, likely hypovolemic and DKA. PMHx:  type 1 diabetes, A-fib on Eliquis, cirrhosis, seizure disorder, aortic stenosis, hypertension, peptic ulcer disease, OA with bilateral knee replacement, polyclonal gammopathy, OSA on CPAP.     Clinical Impressions Pt evaluated s/p the admission list above. At baseline, pt was residing at a SNF where he was receiving therapy and assistance with ADLs/IADLs. Per chart, pt was ambulating ~12ft using RW with PT. Upon evaluation, pt was limited by cognition, generalized weakness, R hip pain, and activity tolerance. Overall, pt required up to MAX A +2 functional mobility tasks. Verbal/tactile cues and increased time was required to initiate and sequence tasks. Based on evaluation, pt will require up to MAX A for ADLs. Pt BP monitored with positional changes. Pt did not express dizziness until supine in bed at the end of session. OT to continue following pt acutely to address functional needs with discharge recommendations of follow-up OT services <3hrs/day to maximize functional independence.   Sitting upright in bed 110/77 (86) Sitting EOB 97/77 (85) Sitting after 3 minutes 108/73 (81) Supine at the end of the session 102/63 (75)     If plan is discharge home, recommend the following:   Two people to help with walking and/or transfers;A lot of help with bathing/dressing/bathroom;Assistance with cooking/housework;Direct supervision/assist for medications management;Direct supervision/assist for financial management;Assist for transportation;Help with stairs or ramp for entrance;Supervision due to cognitive status     Functional Status Assessment   Patient has had a recent decline in their  functional status and demonstrates the ability to make significant improvements in function in a reasonable and predictable amount of time.     Equipment Recommendations   Other (comment) (defer)     Recommendations for Other Services         Precautions/Restrictions   Precautions Precautions: Fall;Other (comment) Recall of Precautions/Restrictions: Impaired Precaution/Restrictions Comments: Monitor BP; Lt basilic picc line Restrictions Weight Bearing Restrictions Per Provider Order: No     Mobility Bed Mobility Overal bed mobility: Needs Assistance Bed Mobility: Supine to Sit, Sit to Supine     Supine to sit: Mod assist, HOB elevated, Used rails Sit to supine: Used rails, Max assist, +2 for physical assistance   General bed mobility comments: Pt required increased time, verbal/tactile cues for sequencing, and assistance lifting RLE OOB due to reports of R hip pain. Pt with good initiation with elevating trunk at EOB but required assistance to fully sit upright    Transfers Overall transfer level: Needs assistance Equipment used: 2 person hand held assist Transfers: Sit to/from Stand Sit to Stand: Max assist, +2 physical assistance           General transfer comment: Pt unable to fully stand upright. Required MAX A +2 to maintain standing      Balance Overall balance assessment: Needs assistance Sitting-balance support: Bilateral upper extremity supported, Feet supported Sitting balance-Leahy Scale: Poor Sitting balance - Comments: static sitting EOB Postural control: Right lateral lean Standing balance support: Bilateral upper extremity supported Standing balance-Leahy Scale: Poor Standing balance comment: HHA MAX +2                           ADL either performed or assessed  with clinical judgement   ADL Overall ADL's : Needs assistance/impaired Eating/Feeding: Sitting;Set up   Grooming: Cueing for sequencing;Sitting;Set up   Upper Body  Bathing: Minimal assistance;Sitting   Lower Body Bathing: Maximal assistance;Sitting/lateral leans   Upper Body Dressing : Minimal assistance;Sitting   Lower Body Dressing: Maximal assistance;Sitting/lateral leans   Toilet Transfer: Maximal assistance;+2 for physical assistance;Squat-pivot;BSC/3in1   Toileting- Clothing Manipulation and Hygiene: Sitting/lateral lean;Maximal assistance       Functional mobility during ADLs: Maximal assistance;+2 for physical assistance General ADL Comments: Pt with impaired cognition, R hip pain, and generalized weakness in BUE/BLE impacting functional performance.     Vision Baseline Vision/History: 1 Wears glasses Ability to See in Adequate Light: 0 Adequate Patient Visual Report: No change from baseline Vision Assessment?: No apparent visual deficits     Perception Perception: Not tested       Praxis Praxis: Not tested       Pertinent Vitals/Pain Pain Assessment Pain Assessment: Faces Faces Pain Scale: Hurts little more Pain Location: R hip Pain Descriptors / Indicators: Discomfort, Aching, Guarding Pain Intervention(s): Monitored during session, Limited activity within patient's tolerance     Extremity/Trunk Assessment Upper Extremity Assessment Upper Extremity Assessment: Generalized weakness   Lower Extremity Assessment Lower Extremity Assessment: Defer to PT evaluation       Communication Communication Communication: No apparent difficulties   Cognition Arousal: Alert Behavior During Therapy: WFL for tasks assessed/performed Cognition: Cognition impaired, No family/caregiver present to determine baseline   Orientation impairments: Place, Situation Awareness: Intellectual awareness impaired, Online awareness impaired Memory impairment (select all impairments): Short-term memory Attention impairment (select first level of impairment): Sustained attention Executive functioning impairment (select all impairments): Problem  solving, Reasoning, Sequencing, Organization OT - Cognition Comments: Pt oriented to self and was able to correctly state year. Pt unable to recall place or situation initially but was able to recall place later in session after verbal cues were provided. Pt did not answer PLOF questions appropriately. Unable to state how much assistance was provided at [facility].                 Following commands: Impaired Following commands impaired: Follows one step commands with increased time, Follows one step commands inconsistently     Cueing  General Comments   Cueing Techniques: Verbal cues;Tactile cues      Exercises     Shoulder Instructions      Home Living Family/patient expects to be discharged to:: Skilled nursing facility Living Arrangements: Spouse/significant other Available Help at Discharge: Family;Available 24 hours/day Type of Home: House Home Access: Stairs to enter [employer] of Steps: 1+1 Entrance Stairs-Rails: None Home Layout: Able to live on main level with bedroom/bathroom     Bathroom Shower/Tub: Producer, television/film/video: Handicapped height Bathroom Accessibility: Yes   Home Equipment: Agricultural consultant (2 wheels);Cane - single point;Standard Walker;BSC/3in1   Additional Comments: Pt poor historian. Family wasn't present to confirm home living      Prior Functioning/Environment Prior Level of Function : Needs assist       Physical Assist : ADLs (physical);Mobility (physical) Mobility (physical): Bed mobility;Transfers;Gait ADLs (physical): Bathing;Dressing;Toileting;IADLs Mobility Comments: States he was beginning to walk 15 feet with assist at SNF. Could ride bike x15 min. Using RW. ADLs Comments: Needs assist for ADLs at SNF    OT Problem List: Decreased strength;Decreased range of motion;Decreased activity tolerance;Impaired balance (sitting and/or standing);Decreased safety awareness;Decreased cognition;Decreased knowledge of use  of DME or AE   OT Treatment/Interventions:  Self-care/ADL training;Therapeutic exercise;DME and/or AE instruction;Therapeutic activities;Cognitive remediation/compensation;Patient/family education;Balance training      OT Goals(Current goals can be found in the care plan section)   Acute Rehab OT Goals Patient Stated Goal: none stated OT Goal Formulation: With patient Time For Goal Achievement: 11/13/23 Potential to Achieve Goals: Good ADL Goals Pt Will Perform Grooming: with supervision;sitting Pt Will Perform Upper Body Dressing: with supervision;sitting Pt Will Perform Lower Body Dressing: with mod assist;sitting/lateral leans;sit to/from stand Pt Will Transfer to Toilet: with mod assist;stand pivot transfer;bedside commode Pt Will Perform Toileting - Clothing Manipulation and hygiene: with mod assist;sitting/lateral leans;sit to/from stand Additional ADL Goal #1: Pt will complete bed mobility with CGA in preparation for OOB functional tasks   OT Frequency:  Min 1X/week    Co-evaluation              AM-PAC OT "6 Clicks" Daily Activity     Outcome Measure Help from another person eating meals?: None Help from another person taking care of personal grooming?: A Little Help from another person toileting, which includes using toliet, bedpan, or urinal?: A Lot Help from another person bathing (including washing, rinsing, drying)?: A Lot Help from another person to put on and taking off regular upper body clothing?: A Little Help from another person to put on and taking off regular lower body clothing?: A Lot 6 Click Score: 16   End of Session Equipment Utilized During Treatment: Gait belt Nurse Communication: Mobility status  Activity Tolerance: Patient tolerated treatment well Patient left: in bed;with bed alarm set;with call bell/phone within reach  OT Visit Diagnosis: Unsteadiness on feet (R26.81);Other abnormalities of gait and mobility (R26.89);Muscle weakness  (generalized) (M62.81)                Time: 1610-9604 OT Time Calculation (min): 33 min Charges:  OT General Charges $OT Visit: 1 Visit OT Evaluation $OT Eval Moderate Complexity: 1 Mod OT Treatments $Therapeutic Activity: 8-22 mins  [address], MOTS  Raeanna Soberanes 10/30/2023, 1:30 PM

## 2023-10-31 ENCOUNTER — Ambulatory Visit: Payer: BC Managed Care – PPO | Admitting: Cardiology

## 2023-10-31 DIAGNOSIS — E0810 Diabetes mellitus due to underlying condition with ketoacidosis without coma: Secondary | ICD-10-CM

## 2023-10-31 DIAGNOSIS — I4891 Unspecified atrial fibrillation: Secondary | ICD-10-CM | POA: Diagnosis not present

## 2023-10-31 DIAGNOSIS — R578 Other shock: Secondary | ICD-10-CM | POA: Diagnosis not present

## 2023-10-31 DIAGNOSIS — E43 Unspecified severe protein-calorie malnutrition: Secondary | ICD-10-CM | POA: Diagnosis not present

## 2023-10-31 DIAGNOSIS — E1010 Type 1 diabetes mellitus with ketoacidosis without coma: Secondary | ICD-10-CM | POA: Diagnosis not present

## 2023-10-31 LAB — GLUCOSE, CAPILLARY
Glucose-Capillary: 168 mg/dL — ABNORMAL HIGH (ref 70–99)
Glucose-Capillary: 264 mg/dL — ABNORMAL HIGH (ref 70–99)
Glucose-Capillary: 316 mg/dL — ABNORMAL HIGH (ref 70–99)
Glucose-Capillary: 300 mg/dL — ABNORMAL HIGH (ref 70–99)

## 2023-10-31 LAB — BASIC METABOLIC PANEL
Anion gap: 8 (ref 5–15)
BUN: 6 mg/dL — ABNORMAL LOW (ref 8–23)
CO2: 23 mmol/L (ref 22–32)
Calcium: 9.2 mg/dL (ref 8.9–10.3)
Chloride: 109 mmol/L (ref 98–111)
Creatinine, Ser: 0.7 mg/dL (ref 0.61–1.24)
GFR, Estimated: >60 mL/min (ref >60)
Glucose, Bld: 123 mg/dL — ABNORMAL HIGH (ref 70–99)
Potassium: 3.7 mmol/L (ref 3.5–5.1)
Sodium: 140 mmol/L (ref 135–145)

## 2023-10-31 LAB — MAGNESIUM: Magnesium: 1.6 mg/dL — ABNORMAL LOW (ref 1.7–2.4)

## 2023-10-31 LAB — PHOSPHORUS: Phosphorus: 2.5 mg/dL (ref 2.5–4.6)

## 2023-10-31 MED ORDER — INSULIN ASPART 100 UNIT/ML IJ SOLN
10.0000 [IU] | Freq: Three times a day (TID) | INTRAMUSCULAR | Status: DC
  Administered 2023-10-31 – 2023-11-03 (×8): 10 [IU] via SUBCUTANEOUS

## 2023-10-31 MED ORDER — AMIODARONE HCL 200 MG PO TABS: ORAL_TABLET | ORAL | Status: DC

## 2023-10-31 MED ORDER — INSULIN GLARGINE 100 UNIT/ML ~~LOC~~ SOLN
18.0000 [IU] | Freq: Two times a day (BID) | SUBCUTANEOUS | Status: DC
  Administered 2023-10-31 – 2023-11-03 (×6): 18 [IU] via SUBCUTANEOUS
  Filled 2023-10-31 (×7): qty 0.18

## 2023-10-31 MED ORDER — MAGNESIUM SULFATE 4 GM/100ML IV SOLN
4.0000 g | Freq: Once | INTRAVENOUS | Status: AC
  Administered 2023-10-31: 4 g via INTRAVENOUS
  Filled 2023-10-31: qty 100

## 2023-10-31 NOTE — Progress Notes (Signed)
 PROGRESS NOTE        PATIENT DETAILS Name: Tony Cantu Age: 73 y.o. Sex: male Date of Birth: [birth year] Admit Date: 10/24/2023 Admitting Physician Charlott Holler, MD ZOX:[id number], Rozell Searing, DO  Brief Summary: Patient is a 73 y.o.  male with history of PAF, severe aortic stenosis, liver cirrhosis, DM-1, seizure disorder, OSA on CPAP-recent hospitalization from 1/21-1/09 for sepsis secondary to Streptococcus bacteremia-transferred to [facility] for evaluation of hypotension-patient was found to have AKI-possible GI bleeding-she was subsequently admitted to the ICU-have stabilized and transferred to [facility] services.  See below for further  Significant events: 2/25>>transfer from [facility] for hematemesis c/f GI bleed and hypotensive needing levophed 2/26>> started on precedex overnight due to agitation, GI signed off 2/27>> Afib RVR on EKG, started amio infusion, converted to sinus  2/28>> Afib RVR, gave additional amio bolus, converted to sinus 2/29>> off levophed 3/03>> transferred to [facility]  Significant studies: 2/25>> CXR: No PNA.  Significant microbiology data: 2/26>> blood: No growth  Procedures: None  Consults: PCCM GI  Subjective: Some intermittent delirium but otherwise stable overnight-no major issues. Minimally confused-easily redirected this morning.  Denies any chest pain or shortness of breath.  Objective: Vitals: Blood pressure (!) 92/55, pulse 80, temperature 98.6 F (37 C), temperature source Oral, resp. rate 20, weight 87.6 kg, SpO2 91%.   Exam: Gen Exam:Alert awake-not in any distress HEENT:atraumatic, normocephalic Chest: B/L clear to auscultation anteriorly CVS:S1S2 regular Abdomen:soft non tender, non distended Extremities:no edema Neurology: Non focal Skin: no rash  Pertinent Labs/Radiology:    Latest Ref Rng & Units 10/30/2023    3:28 AM 10/29/2023    5:00 AM 10/28/2023    4:36 AM  CBC  WBC 4.0 - 10.5 K/uL 4.8  4.2  4.2    Hemoglobin 13.0 - 17.0 g/dL 9.0  11.9  14.7   Hematocrit 39.0 - 52.0 % 26.8  29.7  29.4   Platelets 150 - 400 K/uL 94  96  89     Lab Results  Component Value Date   NA 140 10/31/2023   K 3.7 10/31/2023   CL 109 10/31/2023   CO2 23 10/31/2023     Assessment/Plan: Shock. Hypotension felt to be due to hypovolemia in the setting of DKA Acute blood loss/hemorrhagic shock/sepsis all ruled out Briefly required pressors while in the ICU. BP now stable  DKA Treated with IV fluids/IV insulin-has been transition to SQ insulin  DM-1 (A1c 4.4 on 1/21) Hypoglycemic event yesterday evening Decrease Lantus to 18 units twice daily-10 units of NovoLog with meals-SSI. Follow and optimize.   Recent Labs    10/30/23 2103 10/30/23 2137 10/31/23 0753  GLUCAP 59* 128* 168*     PAF with RVR Rate controlled with amiodarone-now in sinus rhythm Initially on IV heparin infusion-but since no evidence of bleeding-switch to Eliquis on 3/3.  AKI Etiology likely hemodynamically mediated/osmotic mediated diuresis Creatinine has normalized with supportive care.  Severe aortic stenosis Known issue Outpatient cardiology follow-up arranged for 3/18  HTN BP-soft-not on any antihypertensives  HLD Statin  Hypophosphatemia Repleted  Hypomagnesemia Replete/recheck.  Cirrhosis/portal hypertension Relatively stable/compensated Outpatient GI follow-up.  Thrombocytopenia Mild Follow periodically Could be due to acute illness or underlying cirrhosis.  Macrocytic anemia Likely due to acute illness superimposed on anemia of chronic disease. Monitor Hb-no evidence of acute bleeding   Recent history of streptococcal  bacteremia Seems to have completed IV Rocephin-end of treatment was on 2/19.  History of upper GI AVMs No evidence of GI bleeding during this hospitalization-Hb stable Evaluated by GI and not felt to need any further endoscopy evaluation-has had EGD January 2025 (esophageal  candidiasis/gastric AVMs/GAVE) and colonoscopy February 2024 with removal of 4 polyps and tubular adenoma/hyperplastic polyps)  Debility/deconditioning PT/OT-SNF planned  Nutrition Status: Nutrition Problem: Severe Malnutrition Etiology: chronic illness (cirrhosis) Signs/Symptoms: severe fat depletion, severe muscle depletion Interventions: Refer to RD note for recommendations  BMI: Estimated body mass index is 29.36 kg/m as calculated from the following:   Height as of 09/20/23: 5\' 8"  (1.727 m).   Weight as of this encounter: 87.6 kg.   Code status:   Code Status: Full Code   DVT Prophylaxis: SCDs Start: 10/24/23 1735 IV Heparin apixaban (ELIQUIS) tablet 5 mg    Family Communication: Ex wife-[name] [phone] updated over the phone 3/3  Disposition Plan: Status is: Inpatient Remains inpatient appropriate because: Severity of illness   Planned Discharge Destination:Skilled nursing facility-medically stable-awaiting bed.   Diet: Diet Order             Diet regular Room service appropriate? Yes with Assist; Fluid consistency: Thin  Diet effective now                     Antimicrobial agents: Anti-infectives (From admission, onward)    Start     Dose/Rate Route Frequency Ordered Stop   10/24/23 1845  cefTRIAXone (ROCEPHIN) 2 g in sodium chloride 0.9 % 100 mL IVPB  Status:  Discontinued        2 g 200 mL/hr over 30 Minutes Intravenous Every 24 hours 10/24/23 1751 10/26/23 1001        MEDICATIONS: Scheduled Meds:  amiodarone  400 mg Oral BID   Followed by   Melene Muller ON 11/03/2023] amiodarone  400 mg Oral Daily   Followed by   Melene Muller ON 11/08/2023] amiodarone  200 mg Oral Daily   apixaban  5 mg Oral BID   atorvastatin  40 mg Oral Daily   Chlorhexidine Gluconate Cloth  6 each Topical Daily   docusate sodium  100 mg Oral BID   feeding supplement (GLUCERNA SHAKE)  237 mL Oral TID BM   insulin aspart  0-20 Units Subcutaneous TID WC   insulin aspart  0-5  Units Subcutaneous QHS   insulin aspart  12 Units Subcutaneous TID WC   insulin glargine  25 Units Subcutaneous BID   montelukast  10 mg Oral QHS   multivitamin with minerals  1 tablet Oral Daily   pantoprazole  40 mg Oral BID   polyethylene glycol  17 g Oral Daily   sodium chloride flush  10-40 mL Intracatheter Q12H   sodium chloride flush  10-40 mL Intracatheter Q12H   Continuous Infusions:  magnesium sulfate bolus IVPB 4 g (10/31/23 0839)   PRN Meds:.acetaminophen, dextrose, naphazoline-glycerin, sodium chloride flush, sodium chloride flush   I have personally reviewed following labs and imaging studies  LABORATORY DATA: CBC: Recent Labs  Lab 10/25/23 0659 10/25/23 1235 10/26/23 0438 10/27/23 0444 10/28/23 0436 10/29/23 0500 10/30/23 0328  WBC 6.9  --   --  4.5 4.2 4.2 4.8  HGB 10.1*   < > 10.7* 9.9* 10.0* 10.0* 9.0*  HCT 29.4*   < > 31.8* 28.9* 29.4* 29.7* 26.8*  MCV 103.2*  --   --  102.1* 102.4* 103.5* 105.9*  PLT 131*  --   --  87* 89* 96* 94*   < > = values in this interval not displayed.    Basic Metabolic Panel: Recent Labs  Lab 10/27/23 0444 10/28/23 0436 10/29/23 0500 10/30/23 0328 10/31/23 0220  NA 133* 134* 137 140 140  K 3.4* 3.4* 3.3* 4.0 3.7  CL 98 101 105 108 109  CO2 24 27 25 25 23   GLUCOSE 182* 192* 249* 153* 123*  BUN 9 9 8 9  6*  CREATININE 0.82 0.78 0.83 0.81 0.70  CALCIUM 8.4* 8.2* 8.4* 9.2 9.2  MG 1.7 1.7 1.6* 1.8 1.6*  PHOS 1.8* 2.2* 2.5 1.8* 2.5    GFR: Estimated Creatinine Clearance: 87.2 mL/min (by C-G formula based on SCr of 0.7 mg/dL).  Liver Function Tests: Recent Labs  Lab 10/24/23 1744  AST 42*  ALT 40  ALKPHOS 123  BILITOT 1.5*  PROT 6.8  ALBUMIN 2.5*   No results for input(s): "LIPASE", "AMYLASE" in the last 168 hours. Recent Labs  Lab 10/25/23 1420  AMMONIA 32    Coagulation Profile: Recent Labs  Lab 10/24/23 2053  INR 1.2    Cardiac Enzymes: No results for input(s): "CKTOTAL", "CKMB",  "CKMBINDEX", "TROPONINI" in the last 168 hours.  BNP (last 3 results) No results for input(s): "PROBNP" in the last 8760 hours.  Lipid Profile: No results for input(s): "CHOL", "HDL", "LDLCALC", "TRIG", "CHOLHDL", "LDLDIRECT" in the last 72 hours.  Thyroid Function Tests: No results for input(s): "TSH", "T4TOTAL", "FREET4", "T3FREE", "THYROIDAB" in the last 72 hours.  Anemia Panel: No results for input(s): "VITAMINB12", "FOLATE", "FERRITIN", "TIBC", "IRON", "RETICCTPCT" in the last 72 hours.  Urine analysis:    Component Value Date/Time   COLORURINE YELLOW 09/19/2023 2040   APPEARANCEUR CLEAR 09/19/2023 2040   APPEARANCEUR Clear 07/05/2021 1129   LABSPEC 1.015 09/19/2023 2040   PHURINE 6.0 09/19/2023 2040   GLUCOSEU NEGATIVE 09/19/2023 2040   GLUCOSEU NEGATIVE 04/22/2021 1048   HGBUR NEGATIVE 09/19/2023 2040   BILIRUBINUR NEGATIVE 09/19/2023 2040   BILIRUBINUR Negative 07/05/2021 1129   KETONESUR 20 (A) 09/19/2023 2040   PROTEINUR NEGATIVE 09/19/2023 2040   UROBILINOGEN 0.2 04/22/2021 1048   NITRITE NEGATIVE 09/19/2023 2040   LEUKOCYTESUR NEGATIVE 09/19/2023 2040    Sepsis Labs: Lactic Acid, Venous    Component Value Date/Time   LATICACIDVEN 1.9 10/26/2023 2254    MICROBIOLOGY: Recent Results (from the past 240 hours)  MRSA Next Gen by PCR, Nasal     Status: None   Collection Time: 10/24/23  5:09 PM   Specimen: Nasal Mucosa; Nasal Swab  Result Value Ref Range Status   MRSA by PCR Next Gen NOT DETECTED NOT DETECTED Final    Comment: (NOTE) The GeneXpert MRSA Assay (FDA approved for NASAL specimens only), is one component of a comprehensive MRSA colonization surveillance program. It is not intended to diagnose MRSA infection nor to guide or monitor treatment for MRSA infections. Test performance is not FDA approved in patients less than 62 years old. Performed at [facility], [address], [place], [place] [address]   Culture, blood (Routine X 2) w  Reflex to ID Panel     Status: None   Collection Time: 10/25/23  2:59 PM   Specimen: BLOOD RIGHT HAND  Result Value Ref Range Status   Specimen Description BLOOD RIGHT HAND  Final   Special Requests   Final    BOTTLES DRAWN AEROBIC ONLY Blood Culture results may not be optimal due to an inadequate volume of blood received in culture bottles  Culture   Final    NO GROWTH 5 DAYS Performed at [facility], [address] [place], [place] [address]    Report Status 10/30/2023 FINAL  Final  Culture, blood (Routine X 2) w Reflex to ID Panel     Status: None   Collection Time: 10/25/23  3:00 PM   Specimen: BLOOD  Result Value Ref Range Status   Specimen Description BLOOD RIGHT ANTECUBITAL  Final   Special Requests   Final    BOTTLES DRAWN AEROBIC AND ANAEROBIC Blood Culture results may not be optimal due to an inadequate volume of blood received in culture bottles   Culture   Final    NO GROWTH 5 DAYS Performed at [facility], [address], [place], [place] [address]    Report Status 10/30/2023 FINAL  Final    RADIOLOGY STUDIES/RESULTS: No results found.   LOS: 7 days   Jeoffrey Massed, MD  [facility]    To contact the attending provider between 7A-7P or the covering provider during after hours 7P-7A, please log into the web site [email or web address] and access using universal [place] password for that web site. If you do not have the password, please call the hospital operator.  10/31/2023, 10:12 AM

## 2023-10-31 NOTE — Discharge Summary (Incomplete)
 PATIENT DETAILS Name: Tony Cantu Age: 73 y.o. Sex: male Date of Birth: 1951-07-29 MRN: [id number]. Admitting Physician: Charlott Holler, MD [id number], Rozell Searing, DO  Admit Date: 10/24/2023 Discharge date: 11/01/2023  Recommendations for Outpatient Follow-up:  Follow up with PCP in 1-2 weeks Please obtain CMP/CBC in one week Please ensure follow up with cardiology  Admitted From:  SNF  Disposition: Skilled nursing facility   Discharge Condition: good  CODE STATUS:   Code Status: Full Code   Diet recommendation:  Diet Order             Diet - low sodium heart healthy           Diet Carb Modified           Diet regular Room service appropriate? Yes with Assist; Fluid consistency: Thin  Diet effective now                    Brief Summary: Patient is a 73 y.o.  male with history of PAF, severe aortic stenosis, liver cirrhosis, DM-1, seizure disorder, OSA on CPAP-recent hospitalization from 1/21-1/09 for sepsis secondary to Streptococcus bacteremia-transferred to [facility] for evaluation of hypotension-patient was found to have AKI-possible GI bleeding-she was subsequently admitted to the ICU-have stabilized and transferred to [facility] services.  See below for further   Significant events: 2/25>>transfer from [facility] for hematemesis c/f GI bleed and hypotensive needing levophed 2/26>> started on precedex overnight due to agitation, GI signed off 2/27>> Afib RVR on EKG, started amio infusion, converted to sinus  2/28>> Afib RVR, gave additional amio bolus, converted to sinus 2/29>> off levophed 3/03>> transferred to [facility]   Significant studies: 2/25>> CXR: No PNA.   Significant microbiology data: 2/26>> blood: No growth   Procedures: None  Consults: PCCM GI   Brief Hospital Course: Shock. Hypotension felt to be due to hypovolemia in the setting of DKA Acute blood loss/hemorrhagic shock/sepsis all ruled out Briefly required pressors while in the  ICU. BP now stable.   DKA Treated with IV fluids/IV insulin-has been transition to SQ insulin   DM-1 (A1c 4.4 on 1/21) Hypoglycemic event intermittently Resume Lantus 20 units, 8 units of Novolog w meals and SSI May need further titration of medications.  PAF with RVR Rate controlled with amiodarone-now in sinus rhythm Initially on IV heparin infusion-but since no evidence of bleeding-switch to Eliquis on 3/3. Outpatient cardiology follow-up arranged for 3/18 Patient had developed rapid A-fib, is currently on amiodarone loading.  Will have gradual tapering off amiodarone and remain on maintenance amiodarone as scheduled below.   AKI Etiology likely hemodynamically mediated/osmotic mediated diuresis Creatinine has normalized with supportive care.   Severe aortic stenosis Known issue Outpatient cardiology follow-up arranged for 3/18   HTN BP-soft-not on any antihypertensives   HLD Statin   Hypophosphatemia Repleted   Hypomagnesemia Replete/recheck in 1 week.   Cirrhosis/portal hypertension Relatively stable/compensated Outpatient GI follow-up.   Thrombocytopenia Mild Follow periodically Could be due to acute illness or underlying cirrhosis.   Macrocytic anemia Likely due to acute illness superimposed on anemia of chronic disease. Monitor Hb-no evidence of acute bleeding    Recent history of streptococcal bacteremia Seems to have completed IV Rocephin-end of treatment was on 2/19.   History of upper GI AVMs No evidence of GI bleeding during this hospitalization-Hb stable Evaluated by GI and not felt to need any further endoscopy evaluation-has had EGD January 2025 (esophageal candidiasis/gastric AVMs/GAVE) and colonoscopy February 2024 with removal of  4 polyps and tubular adenoma/hyperplastic polyps)   Debility/deconditioning PT/OT-SNF planned   Nutrition Status: Nutrition Problem: Severe Malnutrition Etiology: chronic illness (cirrhosis) Signs/Symptoms:  severe fat depletion, severe muscle depletion Interventions: Refer to RD note for recommendations   BMI: Estimated body mass index is 29.36 kg/m as calculated from the following:   Height as of 09/20/23: 5\' 8"  (1.727 m).   Weight as of this encounter: 87.6 kg.     Discharge Diagnoses:  Principal Problem:   DKA (diabetic ketoacidosis) (HCC) Active Problems:   Shock circulatory (HCC)   GI bleed   Hemorrhagic shock (HCC)   Protein-calorie malnutrition, severe   Atrial fibrillation with rapid ventricular response [facility])   Discharge Instructions:  Activity:  As tolerated with Full fall precautions use walker/cane & assistance as needed  Discharge Instructions     Diet - low sodium heart healthy   Complete by: As directed    Diet Carb Modified   Complete by: As directed    Discharge instructions   Complete by: As directed    Follow with Primary MD  [name], DO in 1-2 weeks  Please get a complete blood count and chemistry panel checked by your Primary MD at your next visit, and again as instructed by your Primary MD.  Get Medicines reviewed and adjusted: Please take all your medications with you for your next visit with your Primary MD  Laboratory/radiological data: Please request your Primary MD to go over all hospital tests and procedure/radiological results at the follow up, please ask your Primary MD to get all Hospital records sent to his/her office.  In some cases, they will be blood work, cultures and biopsy results pending at the time of your discharge. Please request that your primary care M.D. follows up on these results.  Also Note the following: If you experience worsening of your admission symptoms, develop shortness of breath, life threatening emergency, suicidal or homicidal thoughts you must seek medical attention immediately by calling 911 or calling your MD immediately  if symptoms less severe.  You must read complete instructions/literature along  with all the possible adverse reactions/side effects for all the Medicines you take and that have been prescribed to you. Take any new Medicines after you have completely understood and accpet all the possible adverse reactions/side effects.   Do not drive when taking Pain medications or sleeping medications (Benzodaizepines)  Do not take more than prescribed Pain, Sleep and Anxiety Medications. It is not advisable to combine anxiety,sleep and pain medications without talking with your primary care practitioner  Special Instructions: If you have smoked or chewed Tobacco  in the last 2 yrs please stop smoking, stop any regular Alcohol  and or any Recreational drug use.  Wear Seat belts while driving.  Please note: You were cared for by a hospitalist during your hospital stay. Once you are discharged, your primary care physician will handle any further medical issues. Please note that NO REFILLS for any discharge medications will be authorized once you are discharged, as it is imperative that you return to your primary care physician (or establish a relationship with a primary care physician if you do not have one) for your post hospital discharge needs so that they can reassess your need for medications and monitor your lab values.   Check CBG's before meals and bedtime   Increase activity slowly   Complete by: As directed       Allergies as of 11/01/2023  Reactions   Dimetapp Children's Cold-cough Other (See Comments)   Chest discomfort    Erythromycin Diarrhea   Sulfonamide Derivatives Diarrhea        Medication List     STOP taking these medications    cefTRIAXone IVPB Commonly known as: ROCEPHIN   CHROMIUM PICOLATE PO   CINNAMON PO   CoQ10 100 MG Caps   diltiazem 30 MG tablet Commonly known as: CARDIZEM   fluconazole 200 MG tablet Commonly known as: DIFLUCAN   furosemide 20 MG tablet Commonly known as: LASIX   Glucosamine 1500 Complex Caps   Heparin Na  (Pork) Lock Flsh PF 100 UNIT/ML Soln   metoprolol succinate 25 MG 24 hr tablet Commonly known as: TOPROL-XL   potassium chloride SA 20 MEQ tablet Commonly known as: KLOR-CON M   Turmeric Curcumin 500 MG Caps   valsartan 40 MG tablet Commonly known as: DIOVAN       TAKE these medications    acetaminophen 500 MG tablet Commonly known as: TYLENOL Take 1,000 mg by mouth every 6 (six) hours as needed for moderate pain.   amiodarone 200 MG tablet Commonly known as: PACERONE 400 mg  po BID until 3/6 400 mg  po daily from 3/7 to 3/11 200 mg  po daily from 3/12   apixaban 5 MG Tabs tablet Commonly known as: ELIQUIS Take 1 tablet (5 mg total) by mouth 2 (two) times daily.   atorvastatin 40 MG tablet Commonly known as: LIPITOR Take 1 tablet (40 mg total) by mouth daily.   b complex vitamins tablet Take 1 tablet by mouth daily.   Contour Next One Kit Test BS QID and as needed Dx E11.9   Contour Next Test test strip Generic drug: glucose blood Test BS QID and as needed Dx E11.9   dextromethorphan-guaiFENesin 10-100 MG/5ML liquid Commonly known as: ROBITUSSIN-DM Take 10 mLs by mouth in the morning and at bedtime.   Dialyvite Vitamin D 5000 125 MCG (5000 UT) capsule Generic drug: Cholecalciferol Take 5,000 Units by mouth daily.   cholecalciferol 25 MCG (1000 UNIT) tablet Commonly known as: VITAMIN D3 Take 5,000 Units by mouth daily.   ferrous sulfate 325 (65 FE) MG EC tablet Take 1 tablet (325 mg total) by mouth daily with breakfast.   FreeStyle Libre Sensor System Misc Check BS eight (8) times a day. Dx E10.9   Glucagon Emergency 1 MG Kit INJECT AS DIRECTED INTO  UPPER ARM, THIGH OR  BUTTOCKS AS NEEDED FOR  SEVERE HYPOGLYCEMIA. SEEK  MEDICAL ATTENTION AFTER USE   insulin aspart 100 UNIT/ML injection Commonly known as: novoLOG Inject 8 Units into the skin 3 (three) times daily before meals.   insulin aspart 100 UNIT/ML injection Commonly known as:  novoLOG Inject 2-15 Units into the skin 4 (four) times daily -  before meals and at bedtime. CBG 121 - 150: 2 units, CBG 151 - 200: 3 units, CBG 201 - 250: 5 units, CBG 251 - 300: 8 units, CBG 301 - 350: 11 units, CBG 351 - 400: 15 units, CBG > 400call MD   magnesium hydroxide 400 MG/5ML suspension Commonly known as: MILK OF MAGNESIA Take 30 mLs by mouth daily as needed for mild constipation.   magnesium oxide 400 (240 Mg) MG tablet Commonly known as: MAG-OX Take 400 mg by mouth daily.   metformin 1000 MG (OSM) 24 hr tablet Commonly known as: FORTAMET Take 1,000 mg by mouth daily with breakfast.   Microlet Lancets Misc Test BS QID  and as needed Dx E11.9   montelukast 10 MG tablet Commonly known as: SINGULAIR Take 1 tablet (10 mg total) by mouth at bedtime.   multivitamin with minerals tablet Take 1 tablet by mouth daily. What changed: Another medication with the same name was removed. Continue taking this medication, and follow the directions you see here.   pantoprazole 40 MG tablet Commonly known as: PROTONIX TAKE 1 TABLET BY MOUTH TWICE  DAILY BEFORE MEALS   ramelteon 8 MG tablet Commonly known as: Rozerem Take 1 tablet (8 mg total) by mouth at bedtime.   Testosterone 30 MG/ACT Soln Apply 1 pump under each axilla once daily.   Toujeo Max SoloStar 300 UNIT/ML Solostar Pen Generic drug: insulin glargine (2 Unit Dial) Inject 20 Units into the skin daily.        Contact information for follow-up providers     Denyce Robert, NP Follow up.   Specialty: Cardiology Why: Tuesday Nov 14, 2023 Appt at 8:45 AM (45 min) Contact information: [address] [place] [address] [phone]              Contact information for after-discharge care     Destination     [facility] Preferred SNF .   Service: Skilled Nursing Contact information: [address] [place] [address] [phone]                     Allergies  Allergen Reactions   Dimetapp Children's Cold-Cough Other (See Comments)    Chest discomfort    Erythromycin Diarrhea   Sulfonamide Derivatives Diarrhea     Other Procedures/Studies: DG CHEST PORT 1 VIEW Result Date: 10/24/2023 CLINICAL DATA:  Gastrointestinal bleeding, shock EXAM: PORTABLE CHEST 1 VIEW COMPARISON:  10/23/2023 FINDINGS: Single frontal view of the chest demonstrates stable nerve stimulator overlying the right chest, lead extending to the right submandibular region. The cardiac silhouette is unremarkable. Chronic elevation of the right hemidiaphragm. No acute airspace disease, effusion, or pneumothorax. Bilateral shoulder arthroplasties. IMPRESSION: 1. Stable chest, no acute intrathoracic process. Electronically Signed   By: Sharlet Salina M.D.   On: 10/24/2023 22:01   [place] EKG SITE RITE Result Date: 10/24/2023 If [facility] image not attached, placement could not be confirmed due to current cardiac rhythm.    TODAY-DAY OF DISCHARGE:  Subjective:   Patient seen and examined.  No overnight events.  Heart rate is mostly controlled.  Patient is frail.  He is alert awake.  He tells me that someone recommended that he go to rehab on the fourth floor, we discussed about patient unable to go to fourth floor rehab but we are looking for other rehab.  Objective:   Blood pressure (!) 103/59, pulse 74, temperature (!) 97.4 F (36.3 C), temperature source Oral, resp. rate 19, height 5\' 9"  (1.753 m), weight 87.2 kg, SpO2 95%.  Intake/Output Summary (Last 24 hours) at 11/01/2023 1035 Last data filed at 10/31/2023 2225 Gross per 24 hour  Intake 900 ml  Output 1550 ml  Net -650 ml   Filed Weights   10/30/23 0500 10/31/23 0500 11/01/23 0421  Weight: 88.9 kg 87.6 kg 87.2 kg    Exam: Awake Alert, Oriented mostly oriented.  Very frail and debilitated., No new F.N deficits, Normal affect [place].AT,PERRAL Supple Neck,No JVD, No cervical lymphadenopathy appriciated.   Symmetrical Chest Nieves movement, Good air movement bilaterally, CTAB RRR,No Gallops,Rubs or new Murmurs, No Parasternal Heave +ve B.Sounds, Abd Soft, Non tender, No organomegaly  appriciated, No rebound -guarding or rigidity. No Cyanosis, Clubbing or edema, No new Rash or bruise   PERTINENT RADIOLOGIC STUDIES: No results found.   PERTINENT LAB RESULTS: CBC: Recent Labs    10/30/23 0328 11/01/23 0448  WBC 4.8 5.0  HGB 9.0* 7.6*  HCT 26.8* 22.9*  PLT 94* 90*   CMET CMP     Component Value Date/Time   NA 138 11/01/2023 0448   NA 144 01/09/2023 1001   K 3.8 11/01/2023 0448   CL 106 11/01/2023 0448   CO2 24 11/01/2023 0448   GLUCOSE 189 (H) 11/01/2023 0448   BUN 10 11/01/2023 0448   BUN 15 01/09/2023 1001   CREATININE 0.72 11/01/2023 0448   CREATININE 0.82 01/07/2013 0840   CALCIUM 8.9 11/01/2023 0448   PROT 6.8 10/24/2023 1744   PROT 6.9 01/09/2023 1001   ALBUMIN 2.5 (L) 10/24/2023 1744   ALBUMIN 3.6 (L) 01/09/2023 1001   AST 42 (H) 10/24/2023 1744   ALT 40 10/24/2023 1744   ALKPHOS 123 10/24/2023 1744   BILITOT 1.5 (H) 10/24/2023 1744   BILITOT 0.8 01/09/2023 1001   GFR 77.82 08/03/2022 1106   EGFR 68 01/09/2023 1001   GFRNONAA >60 11/01/2023 0448   GFRNONAA >89 01/07/2013 0840    GFR Estimated Creatinine Clearance: 88.6 mL/min (by C-G formula based on SCr of 0.72 mg/dL). No results for input(s): "LIPASE", "AMYLASE" in the last 72 hours. No results for input(s): "CKTOTAL", "CKMB", "CKMBINDEX", "TROPONINI" in the last 72 hours. Invalid input(s): "POCBNP" No results for input(s): "DDIMER" in the last 72 hours. No results for input(s): "HGBA1C" in the last 72 hours. No results for input(s): "CHOL", "HDL", "LDLCALC", "TRIG", "CHOLHDL", "LDLDIRECT" in the last 72 hours. No results for input(s): "TSH", "T4TOTAL", "T3FREE", "THYROIDAB" in the last 72 hours.  Invalid input(s): "FREET3" No results for input(s): "VITAMINB12", "FOLATE", "FERRITIN", "TIBC", "IRON",  "RETICCTPCT" in the last 72 hours. Coags: No results for input(s): "INR" in the last 72 hours.  Invalid input(s): "PT" Microbiology: Recent Results (from the past 240 hours)  MRSA Next Gen by PCR, Nasal     Status: None   Collection Time: 10/24/23  5:09 PM   Specimen: Nasal Mucosa; Nasal Swab  Result Value Ref Range Status   MRSA by PCR Next Gen NOT DETECTED NOT DETECTED Final    Comment: (NOTE) The GeneXpert MRSA Assay (FDA approved for NASAL specimens only), is one component of a comprehensive MRSA colonization surveillance program. It is not intended to diagnose MRSA infection nor to guide or monitor treatment for MRSA infections. Test performance is not FDA approved in patients less than 49 years old. Performed at [facility], [address], [place], [place] [address]   Culture, blood (Routine X 2) w Reflex to ID Panel     Status: None   Collection Time: 10/25/23  2:59 PM   Specimen: BLOOD RIGHT HAND  Result Value Ref Range Status   Specimen Description BLOOD RIGHT HAND  Final   Special Requests   Final    BOTTLES DRAWN AEROBIC ONLY Blood Culture results may not be optimal due to an inadequate volume of blood received in culture bottles   Culture   Final    NO GROWTH 5 DAYS Performed at [facility], [address] [place], [place] [address]    Report Status 10/30/2023 FINAL  Final  Culture, blood (Routine X 2) w Reflex to ID Panel     Status: None   Collection Time:  10/25/23  3:00 PM   Specimen: BLOOD  Result Value Ref Range Status   Specimen Description BLOOD RIGHT ANTECUBITAL  Final   Special Requests   Final    BOTTLES DRAWN AEROBIC AND ANAEROBIC Blood Culture results may not be optimal due to an inadequate volume of blood received in culture bottles   Culture   Final    NO GROWTH 5 DAYS Performed at [facility], [address] [place], [place] [address]    Report Status 10/30/2023 FINAL  Final    FURTHER DISCHARGE INSTRUCTIONS:  Get  Medicines reviewed and adjusted: Please take all your medications with you for your next visit with your Primary MD  Laboratory/radiological data: Please request your Primary MD to go over all hospital tests and procedure/radiological results at the follow up, please ask your Primary MD to get all Hospital records sent to his/her office.  In some cases, they will be blood work, cultures and biopsy results pending at the time of your discharge. Please request that your primary care M.D. goes through all the records of your hospital data and follows up on these results.  Also Note the following: If you experience worsening of your admission symptoms, develop shortness of breath, life threatening emergency, suicidal or homicidal thoughts you must seek medical attention immediately by calling 911 or calling your MD immediately  if symptoms less severe.  You must read complete instructions/literature along with all the possible adverse reactions/side effects for all the Medicines you take and that have been prescribed to you. Take any new Medicines after you have completely understood and accpet all the possible adverse reactions/side effects.   Do not drive when taking Pain medications or sleeping medications (Benzodaizepines)  Do not take more than prescribed Pain, Sleep and Anxiety Medications. It is not advisable to combine anxiety,sleep and pain medications without talking with your primary care practitioner  Special Instructions: If you have smoked or chewed Tobacco  in the last 2 yrs please stop smoking, stop any regular Alcohol  and or any Recreational drug use.  Wear Seat belts while driving.  Please note: You were cared for by a hospitalist during your hospital stay. Once you are discharged, your primary care physician will handle any further medical issues. Please note that NO REFILLS for any discharge medications will be authorized once you are discharged, as it is imperative that you  return to your primary care physician (or establish a relationship with a primary care physician if you do not have one) for your post hospital discharge needs so that they can reassess your need for medications and monitor your lab values.  Total Time spent coordinating discharge including counseling, education and face to face time equals greater than 30 minutes.  SignedDorcas Carrow 11/01/2023 10:35 AM

## 2023-10-31 NOTE — TOC Progression Note (Signed)
 Transition of Care [facility]) - Progression Note    Patient Details  Name: Tony Cantu MRN: [id number] Date of Birth: Jul 09, 1951  [facility] CM/SW Contact  Mearl Latin, LCSW Phone Number: 10/31/2023, 9:46 AM  Clinical Narrative:    9:17am-CSW spoke with patient's POA, [name], and provided update and additional SNF bed offers. She will research the options.  9:58 AM-[name] called CSW back and has selected [facility]. CSW requested [facility] begin insurance process with [employer].   Expected Discharge Plan: Skilled Nursing Facility Barriers to Discharge: English as a second language teacher, SNF Pending bed offer  Expected Discharge Plan and Services In-house Referral: Clinical Social Work   Post Acute Care Choice: Skilled Nursing Facility Living arrangements for the past 2 months: Single Family Home, Skilled Nursing Facility                                       Social Determinants of Health (SDOH) Interventions SDOH Screenings   Food Insecurity: No Food Insecurity (10/30/2023)  Housing: High Risk (10/30/2023)  Transportation Needs: No Transportation Needs (10/30/2023)  Utilities: Not At Risk (10/30/2023)  Depression (PHQ2-9): Medium Risk (09/13/2023)  Financial Resource Strain: Low Risk  (06/09/2022)  Social Connections: Socially Isolated (10/30/2023)  Tobacco Use: Medium Risk (10/23/2023)   Received from [facility]    Readmission Risk Interventions     No data to display

## 2023-10-31 NOTE — Plan of Care (Signed)
   Problem: Education: Goal: Knowledge of General Education information will improve Description Including pain rating scale, medication(s)/side effects and non-pharmacologic comfort measures Outcome: Progressing

## 2023-10-31 NOTE — Plan of Care (Signed)
  Problem: Education: Goal: Knowledge of General Education information will improve Description: Including pain rating scale, medication(s)/side effects and non-pharmacologic comfort measures Outcome: Progressing   Problem: Health Behavior/Discharge Planning: Goal: Ability to manage health-related needs will improve Outcome: Progressing   Problem: Clinical Measurements: Goal: Ability to maintain clinical measurements within normal limits will improve Outcome: Progressing Goal: Will remain free from infection Outcome: Progressing Goal: Respiratory complications will improve Outcome: Progressing   Problem: Activity: Goal: Risk for activity intolerance will decrease Outcome: Progressing   Problem: Nutrition: Goal: Adequate nutrition will be maintained Outcome: Progressing   Problem: Coping: Goal: Level of anxiety will decrease Outcome: Progressing   

## 2023-11-01 ENCOUNTER — Inpatient Hospital Stay: Payer: BC Managed Care – PPO | Admitting: Internal Medicine

## 2023-11-01 ENCOUNTER — Encounter (HOSPITAL_COMMUNITY): Payer: Self-pay | Admitting: Internal Medicine

## 2023-11-01 ENCOUNTER — Ambulatory Visit: Payer: BC Managed Care – PPO | Admitting: Orthopaedic Surgery

## 2023-11-01 DIAGNOSIS — E0810 Diabetes mellitus due to underlying condition with ketoacidosis without coma: Secondary | ICD-10-CM

## 2023-11-01 LAB — CBC
HCT: 22.9 % — ABNORMAL LOW (ref 39.0–52.0)
Hemoglobin: 7.6 g/dL — ABNORMAL LOW (ref 13.0–17.0)
MCH: 35.8 pg — ABNORMAL HIGH (ref 26.0–34.0)
MCHC: 33.2 g/dL (ref 30.0–36.0)
MCV: 108 fL — ABNORMAL HIGH (ref 80.0–100.0)
Platelets: 90 10*3/uL — ABNORMAL LOW (ref 150–400)
RBC: 2.12 MIL/uL — ABNORMAL LOW (ref 4.22–5.81)
RDW: 18.2 % — ABNORMAL HIGH (ref 11.5–15.5)
WBC: 5 10*3/uL (ref 4.0–10.5)
nRBC: 0 % (ref 0.0–0.2)

## 2023-11-01 LAB — BASIC METABOLIC PANEL
Anion gap: 8 (ref 5–15)
BUN: 10 mg/dL (ref 8–23)
CO2: 24 mmol/L (ref 22–32)
Calcium: 8.9 mg/dL (ref 8.9–10.3)
Chloride: 106 mmol/L (ref 98–111)
Creatinine, Ser: 0.72 mg/dL (ref 0.61–1.24)
GFR, Estimated: >60 mL/min (ref >60)
Glucose, Bld: 189 mg/dL — ABNORMAL HIGH (ref 70–99)
Potassium: 3.8 mmol/L (ref 3.5–5.1)
Sodium: 138 mmol/L (ref 135–145)

## 2023-11-01 LAB — GLUCOSE, CAPILLARY
Glucose-Capillary: 172 mg/dL — ABNORMAL HIGH (ref 70–99)
Glucose-Capillary: 216 mg/dL — ABNORMAL HIGH (ref 70–99)
Glucose-Capillary: 260 mg/dL — ABNORMAL HIGH (ref 70–99)
Glucose-Capillary: 184 mg/dL — ABNORMAL HIGH (ref 70–99)

## 2023-11-01 LAB — MAGNESIUM: Magnesium: 1.8 mg/dL (ref 1.7–2.4)

## 2023-11-01 NOTE — Plan of Care (Signed)
  Problem: Education: Goal: Knowledge of General Education information will improve Description: Including pain rating scale, medication(s)/side effects and non-pharmacologic comfort measures Outcome: Progressing   Problem: Health Behavior/Discharge Planning: Goal: Ability to manage health-related needs will improve Outcome: Progressing   Problem: Clinical Measurements: Goal: Ability to maintain clinical measurements within normal limits will improve Outcome: Progressing Goal: Will remain free from infection Outcome: Progressing Goal: Diagnostic test results will improve Outcome: Progressing Goal: Respiratory complications will improve Outcome: Progressing Goal: Cardiovascular complication will be avoided Outcome: Progressing   Problem: Activity: Goal: Risk for activity intolerance will decrease Outcome: Progressing   Problem: Nutrition: Goal: Adequate nutrition will be maintained Outcome: Progressing   Problem: Elimination: Goal: Will not experience complications related to bowel motility Outcome: Progressing Goal: Will not experience complications related to urinary retention Outcome: Progressing   Problem: Safety: Goal: Ability to remain free from injury will improve Outcome: Progressing   Problem: Skin Integrity: Goal: Risk for impaired skin integrity will decrease Outcome: Progressing   Problem: Fluid Volume: Goal: Ability to maintain a balanced intake and output will improve Outcome: Progressing   Problem: Metabolic: Goal: Ability to maintain appropriate glucose levels will improve Outcome: Progressing   Problem: Skin Integrity: Goal: Risk for impaired skin integrity will decrease Outcome: Progressing   Filiberto Pinks, RN

## 2023-11-01 NOTE — Discharge Summary (Signed)
 PATIENT DETAILS Name: Tony Cantu Age: 73 y.o. Sex: male Date of Birth: 1951-07-29 MRN: [id number]. Admitting Physician: Charlott Holler, MD [id number], Rozell Searing, DO  Admit Date: 10/24/2023 Discharge date: 11/01/2023  Recommendations for Outpatient Follow-up:  Follow up with PCP in 1-2 weeks Please obtain CMP/CBC in one week Please ensure follow up with cardiology  Admitted From:  SNF  Disposition: Skilled nursing facility   Discharge Condition: good  CODE STATUS:   Code Status: Full Code   Diet recommendation:  Diet Order             Diet - low sodium heart healthy           Diet Carb Modified           Diet regular Room service appropriate? Yes with Assist; Fluid consistency: Thin  Diet effective now                    Brief Summary: Patient is a 73 y.o.  male with history of PAF, severe aortic stenosis, liver cirrhosis, DM-1, seizure disorder, OSA on CPAP-recent hospitalization from 1/21-1/09 for sepsis secondary to Streptococcus bacteremia-transferred to [facility] for evaluation of hypotension-patient was found to have AKI-possible GI bleeding-she was subsequently admitted to the ICU-have stabilized and transferred to [facility] services.  See below for further   Significant events: 2/25>>transfer from [facility] for hematemesis c/f GI bleed and hypotensive needing levophed 2/26>> started on precedex overnight due to agitation, GI signed off 2/27>> Afib RVR on EKG, started amio infusion, converted to sinus  2/28>> Afib RVR, gave additional amio bolus, converted to sinus 2/29>> off levophed 3/03>> transferred to [facility]   Significant studies: 2/25>> CXR: No PNA.   Significant microbiology data: 2/26>> blood: No growth   Procedures: None  Consults: PCCM GI   Brief Hospital Course: Shock. Hypotension felt to be due to hypovolemia in the setting of DKA Acute blood loss/hemorrhagic shock/sepsis all ruled out Briefly required pressors while in the  ICU. BP now stable.   DKA Treated with IV fluids/IV insulin-has been transition to SQ insulin   DM-1 (A1c 4.4 on 1/21) Hypoglycemic event intermittently Resume Lantus 20 units, 8 units of Novolog w meals and SSI May need further titration of medications.  PAF with RVR Rate controlled with amiodarone-now in sinus rhythm Initially on IV heparin infusion-but since no evidence of bleeding-switch to Eliquis on 3/3. Outpatient cardiology follow-up arranged for 3/18 Patient had developed rapid A-fib, is currently on amiodarone loading.  Will have gradual tapering off amiodarone and remain on maintenance amiodarone as scheduled below.   AKI Etiology likely hemodynamically mediated/osmotic mediated diuresis Creatinine has normalized with supportive care.   Severe aortic stenosis Known issue Outpatient cardiology follow-up arranged for 3/18   HTN BP-soft-not on any antihypertensives   HLD Statin   Hypophosphatemia Repleted   Hypomagnesemia Replete/recheck in 1 week.   Cirrhosis/portal hypertension Relatively stable/compensated Outpatient GI follow-up.   Thrombocytopenia Mild Follow periodically Could be due to acute illness or underlying cirrhosis.   Macrocytic anemia Likely due to acute illness superimposed on anemia of chronic disease. Monitor Hb-no evidence of acute bleeding    Recent history of streptococcal bacteremia Seems to have completed IV Rocephin-end of treatment was on 2/19.   History of upper GI AVMs No evidence of GI bleeding during this hospitalization-Hb stable Evaluated by GI and not felt to need any further endoscopy evaluation-has had EGD January 2025 (esophageal candidiasis/gastric AVMs/GAVE) and colonoscopy February 2024 with removal of  4 polyps and tubular adenoma/hyperplastic polyps)   Debility/deconditioning PT/OT-SNF planned   Nutrition Status: Nutrition Problem: Severe Malnutrition Etiology: chronic illness (cirrhosis) Signs/Symptoms:  severe fat depletion, severe muscle depletion Interventions: Refer to RD note for recommendations   BMI: Estimated body mass index is 29.36 kg/m as calculated from the following:   Height as of 09/20/23: 5\' 8"  (1.727 m).   Weight as of this encounter: 87.6 kg.     Discharge Diagnoses:  Principal Problem:   DKA (diabetic ketoacidosis) (HCC) Active Problems:   Shock circulatory (HCC)   GI bleed   Hemorrhagic shock (HCC)   Protein-calorie malnutrition, severe   Atrial fibrillation with rapid ventricular response [facility])   Discharge Instructions:  Activity:  As tolerated with Full fall precautions use walker/cane & assistance as needed  Discharge Instructions     Diet - low sodium heart healthy   Complete by: As directed    Diet Carb Modified   Complete by: As directed    Discharge instructions   Complete by: As directed    Follow with Primary MD  [name], DO in 1-2 weeks  Please get a complete blood count and chemistry panel checked by your Primary MD at your next visit, and again as instructed by your Primary MD.  Get Medicines reviewed and adjusted: Please take all your medications with you for your next visit with your Primary MD  Laboratory/radiological data: Please request your Primary MD to go over all hospital tests and procedure/radiological results at the follow up, please ask your Primary MD to get all Hospital records sent to his/her office.  In some cases, they will be blood work, cultures and biopsy results pending at the time of your discharge. Please request that your primary care M.D. follows up on these results.  Also Note the following: If you experience worsening of your admission symptoms, develop shortness of breath, life threatening emergency, suicidal or homicidal thoughts you must seek medical attention immediately by calling 911 or calling your MD immediately  if symptoms less severe.  You must read complete instructions/literature along  with all the possible adverse reactions/side effects for all the Medicines you take and that have been prescribed to you. Take any new Medicines after you have completely understood and accpet all the possible adverse reactions/side effects.   Do not drive when taking Pain medications or sleeping medications (Benzodaizepines)  Do not take more than prescribed Pain, Sleep and Anxiety Medications. It is not advisable to combine anxiety,sleep and pain medications without talking with your primary care practitioner  Special Instructions: If you have smoked or chewed Tobacco  in the last 2 yrs please stop smoking, stop any regular Alcohol  and or any Recreational drug use.  Wear Seat belts while driving.  Please note: You were cared for by a hospitalist during your hospital stay. Once you are discharged, your primary care physician will handle any further medical issues. Please note that NO REFILLS for any discharge medications will be authorized once you are discharged, as it is imperative that you return to your primary care physician (or establish a relationship with a primary care physician if you do not have one) for your post hospital discharge needs so that they can reassess your need for medications and monitor your lab values.   Check CBG's before meals and bedtime   Increase activity slowly   Complete by: As directed       Allergies as of 11/01/2023  Reactions   Dimetapp Children's Cold-cough Other (See Comments)   Chest discomfort    Erythromycin Diarrhea   Sulfonamide Derivatives Diarrhea        Medication List     STOP taking these medications    cefTRIAXone IVPB Commonly known as: ROCEPHIN   CHROMIUM PICOLATE PO   CINNAMON PO   CoQ10 100 MG Caps   diltiazem 30 MG tablet Commonly known as: CARDIZEM   fluconazole 200 MG tablet Commonly known as: DIFLUCAN   furosemide 20 MG tablet Commonly known as: LASIX   Glucosamine 1500 Complex Caps   Heparin Na  (Pork) Lock Flsh PF 100 UNIT/ML Soln   metoprolol succinate 25 MG 24 hr tablet Commonly known as: TOPROL-XL   potassium chloride SA 20 MEQ tablet Commonly known as: KLOR-CON M   Turmeric Curcumin 500 MG Caps   valsartan 40 MG tablet Commonly known as: DIOVAN       TAKE these medications    acetaminophen 500 MG tablet Commonly known as: TYLENOL Take 1,000 mg by mouth every 6 (six) hours as needed for moderate pain.   amiodarone 200 MG tablet Commonly known as: PACERONE 400 mg  po BID until 3/6 400 mg  po daily from 3/7 to 3/11 200 mg  po daily from 3/12   apixaban 5 MG Tabs tablet Commonly known as: ELIQUIS Take 1 tablet (5 mg total) by mouth 2 (two) times daily.   atorvastatin 40 MG tablet Commonly known as: LIPITOR Take 1 tablet (40 mg total) by mouth daily.   b complex vitamins tablet Take 1 tablet by mouth daily.   Contour Next One Kit Test BS QID and as needed Dx E11.9   Contour Next Test test strip Generic drug: glucose blood Test BS QID and as needed Dx E11.9   dextromethorphan-guaiFENesin 10-100 MG/5ML liquid Commonly known as: ROBITUSSIN-DM Take 10 mLs by mouth in the morning and at bedtime.   Dialyvite Vitamin D 5000 125 MCG (5000 UT) capsule Generic drug: Cholecalciferol Take 5,000 Units by mouth daily.   cholecalciferol 25 MCG (1000 UNIT) tablet Commonly known as: VITAMIN D3 Take 5,000 Units by mouth daily.   ferrous sulfate 325 (65 FE) MG EC tablet Take 1 tablet (325 mg total) by mouth daily with breakfast.   FreeStyle Libre Sensor System Misc Check BS eight (8) times a day. Dx E10.9   Glucagon Emergency 1 MG Kit INJECT AS DIRECTED INTO  UPPER ARM, THIGH OR  BUTTOCKS AS NEEDED FOR  SEVERE HYPOGLYCEMIA. SEEK  MEDICAL ATTENTION AFTER USE   insulin aspart 100 UNIT/ML injection Commonly known as: novoLOG Inject 8 Units into the skin 3 (three) times daily before meals.   insulin aspart 100 UNIT/ML injection Commonly known as:  novoLOG Inject 2-15 Units into the skin 4 (four) times daily -  before meals and at bedtime. CBG 121 - 150: 2 units, CBG 151 - 200: 3 units, CBG 201 - 250: 5 units, CBG 251 - 300: 8 units, CBG 301 - 350: 11 units, CBG 351 - 400: 15 units, CBG > 400call MD   magnesium hydroxide 400 MG/5ML suspension Commonly known as: MILK OF MAGNESIA Take 30 mLs by mouth daily as needed for mild constipation.   magnesium oxide 400 (240 Mg) MG tablet Commonly known as: MAG-OX Take 400 mg by mouth daily.   metformin 1000 MG (OSM) 24 hr tablet Commonly known as: FORTAMET Take 1,000 mg by mouth daily with breakfast.   Microlet Lancets Misc Test BS QID  and as needed Dx E11.9   montelukast 10 MG tablet Commonly known as: SINGULAIR Take 1 tablet (10 mg total) by mouth at bedtime.   multivitamin with minerals tablet Take 1 tablet by mouth daily. What changed: Another medication with the same name was removed. Continue taking this medication, and follow the directions you see here.   pantoprazole 40 MG tablet Commonly known as: PROTONIX TAKE 1 TABLET BY MOUTH TWICE  DAILY BEFORE MEALS   ramelteon 8 MG tablet Commonly known as: Rozerem Take 1 tablet (8 mg total) by mouth at bedtime.   Testosterone 30 MG/ACT Soln Apply 1 pump under each axilla once daily.   Toujeo Max SoloStar 300 UNIT/ML Solostar Pen Generic drug: insulin glargine (2 Unit Dial) Inject 20 Units into the skin daily.        Contact information for follow-up providers     Denyce Robert, NP Follow up.   Specialty: Cardiology Why: Tuesday Nov 14, 2023 Appt at 8:45 AM (45 min) Contact information: [address] [place] [address] [phone]              Contact information for after-discharge care     Destination     [facility] Preferred SNF .   Service: Skilled Nursing Contact information: [address] [place] [address] [phone]                     Allergies  Allergen Reactions   Dimetapp Children's Cold-Cough Other (See Comments)    Chest discomfort    Erythromycin Diarrhea   Sulfonamide Derivatives Diarrhea     Other Procedures/Studies: DG CHEST PORT 1 VIEW Result Date: 10/24/2023 CLINICAL DATA:  Gastrointestinal bleeding, shock EXAM: PORTABLE CHEST 1 VIEW COMPARISON:  10/23/2023 FINDINGS: Single frontal view of the chest demonstrates stable nerve stimulator overlying the right chest, lead extending to the right submandibular region. The cardiac silhouette is unremarkable. Chronic elevation of the right hemidiaphragm. No acute airspace disease, effusion, or pneumothorax. Bilateral shoulder arthroplasties. IMPRESSION: 1. Stable chest, no acute intrathoracic process. Electronically Signed   By: Sharlet Salina M.D.   On: 10/24/2023 22:01   [place] EKG SITE RITE Result Date: 10/24/2023 If [facility] image not attached, placement could not be confirmed due to current cardiac rhythm.    TODAY-DAY OF DISCHARGE:  Subjective:   Patient seen and examined.  No overnight events.  Heart rate is mostly controlled.  Patient is frail.  He is alert awake.  He tells me that someone recommended that he go to rehab on the fourth floor, we discussed about patient unable to go to fourth floor rehab but we are looking for other rehab.  Objective:   Blood pressure (!) 103/59, pulse 74, temperature (!) 97.4 F (36.3 C), temperature source Oral, resp. rate 19, height 5\' 9"  (1.753 m), weight 87.2 kg, SpO2 95%.  Intake/Output Summary (Last 24 hours) at 11/01/2023 1035 Last data filed at 10/31/2023 2225 Gross per 24 hour  Intake 900 ml  Output 1550 ml  Net -650 ml   Filed Weights   10/30/23 0500 10/31/23 0500 11/01/23 0421  Weight: 88.9 kg 87.6 kg 87.2 kg    Exam: Awake Alert, Oriented mostly oriented.  Very frail and debilitated., No new F.N deficits, Normal affect [place].AT,PERRAL Supple Neck,No JVD, No cervical lymphadenopathy appriciated.   Symmetrical Chest Nieves movement, Good air movement bilaterally, CTAB RRR,No Gallops,Rubs or new Murmurs, No Parasternal Heave +ve B.Sounds, Abd Soft, Non tender, No organomegaly  appriciated, No rebound -guarding or rigidity. No Cyanosis, Clubbing or edema, No new Rash or bruise   PERTINENT RADIOLOGIC STUDIES: No results found.   PERTINENT LAB RESULTS: CBC: Recent Labs    10/30/23 0328 11/01/23 0448  WBC 4.8 5.0  HGB 9.0* 7.6*  HCT 26.8* 22.9*  PLT 94* 90*   CMET CMP     Component Value Date/Time   NA 138 11/01/2023 0448   NA 144 01/09/2023 1001   K 3.8 11/01/2023 0448   CL 106 11/01/2023 0448   CO2 24 11/01/2023 0448   GLUCOSE 189 (H) 11/01/2023 0448   BUN 10 11/01/2023 0448   BUN 15 01/09/2023 1001   CREATININE 0.72 11/01/2023 0448   CREATININE 0.82 01/07/2013 0840   CALCIUM 8.9 11/01/2023 0448   PROT 6.8 10/24/2023 1744   PROT 6.9 01/09/2023 1001   ALBUMIN 2.5 (L) 10/24/2023 1744   ALBUMIN 3.6 (L) 01/09/2023 1001   AST 42 (H) 10/24/2023 1744   ALT 40 10/24/2023 1744   ALKPHOS 123 10/24/2023 1744   BILITOT 1.5 (H) 10/24/2023 1744   BILITOT 0.8 01/09/2023 1001   GFR 77.82 08/03/2022 1106   EGFR 68 01/09/2023 1001   GFRNONAA >60 11/01/2023 0448   GFRNONAA >89 01/07/2013 0840    GFR Estimated Creatinine Clearance: 88.6 mL/min (by C-G formula based on SCr of 0.72 mg/dL). No results for input(s): "LIPASE", "AMYLASE" in the last 72 hours. No results for input(s): "CKTOTAL", "CKMB", "CKMBINDEX", "TROPONINI" in the last 72 hours. Invalid input(s): "POCBNP" No results for input(s): "DDIMER" in the last 72 hours. No results for input(s): "HGBA1C" in the last 72 hours. No results for input(s): "CHOL", "HDL", "LDLCALC", "TRIG", "CHOLHDL", "LDLDIRECT" in the last 72 hours. No results for input(s): "TSH", "T4TOTAL", "T3FREE", "THYROIDAB" in the last 72 hours.  Invalid input(s): "FREET3" No results for input(s): "VITAMINB12", "FOLATE", "FERRITIN", "TIBC", "IRON",  "RETICCTPCT" in the last 72 hours. Coags: No results for input(s): "INR" in the last 72 hours.  Invalid input(s): "PT" Microbiology: Recent Results (from the past 240 hours)  MRSA Next Gen by PCR, Nasal     Status: None   Collection Time: 10/24/23  5:09 PM   Specimen: Nasal Mucosa; Nasal Swab  Result Value Ref Range Status   MRSA by PCR Next Gen NOT DETECTED NOT DETECTED Final    Comment: (NOTE) The GeneXpert MRSA Assay (FDA approved for NASAL specimens only), is one component of a comprehensive MRSA colonization surveillance program. It is not intended to diagnose MRSA infection nor to guide or monitor treatment for MRSA infections. Test performance is not FDA approved in patients less than 49 years old. Performed at [facility], [address], [place], [place] [address]   Culture, blood (Routine X 2) w Reflex to ID Panel     Status: None   Collection Time: 10/25/23  2:59 PM   Specimen: BLOOD RIGHT HAND  Result Value Ref Range Status   Specimen Description BLOOD RIGHT HAND  Final   Special Requests   Final    BOTTLES DRAWN AEROBIC ONLY Blood Culture results may not be optimal due to an inadequate volume of blood received in culture bottles   Culture   Final    NO GROWTH 5 DAYS Performed at [facility], [address] [place], [place] [address]    Report Status 10/30/2023 FINAL  Final  Culture, blood (Routine X 2) w Reflex to ID Panel     Status: None   Collection Time:  10/25/23  3:00 PM   Specimen: BLOOD  Result Value Ref Range Status   Specimen Description BLOOD RIGHT ANTECUBITAL  Final   Special Requests   Final    BOTTLES DRAWN AEROBIC AND ANAEROBIC Blood Culture results may not be optimal due to an inadequate volume of blood received in culture bottles   Culture   Final    NO GROWTH 5 DAYS Performed at [facility], [address] [place], [place] [address]    Report Status 10/30/2023 FINAL  Final    FURTHER DISCHARGE INSTRUCTIONS:  Get  Medicines reviewed and adjusted: Please take all your medications with you for your next visit with your Primary MD  Laboratory/radiological data: Please request your Primary MD to go over all hospital tests and procedure/radiological results at the follow up, please ask your Primary MD to get all Hospital records sent to his/her office.  In some cases, they will be blood work, cultures and biopsy results pending at the time of your discharge. Please request that your primary care M.D. goes through all the records of your hospital data and follows up on these results.  Also Note the following: If you experience worsening of your admission symptoms, develop shortness of breath, life threatening emergency, suicidal or homicidal thoughts you must seek medical attention immediately by calling 911 or calling your MD immediately  if symptoms less severe.  You must read complete instructions/literature along with all the possible adverse reactions/side effects for all the Medicines you take and that have been prescribed to you. Take any new Medicines after you have completely understood and accpet all the possible adverse reactions/side effects.   Do not drive when taking Pain medications or sleeping medications (Benzodaizepines)  Do not take more than prescribed Pain, Sleep and Anxiety Medications. It is not advisable to combine anxiety,sleep and pain medications without talking with your primary care practitioner  Special Instructions: If you have smoked or chewed Tobacco  in the last 2 yrs please stop smoking, stop any regular Alcohol  and or any Recreational drug use.  Wear Seat belts while driving.  Please note: You were cared for by a hospitalist during your hospital stay. Once you are discharged, your primary care physician will handle any further medical issues. Please note that NO REFILLS for any discharge medications will be authorized once you are discharged, as it is imperative that you  return to your primary care physician (or establish a relationship with a primary care physician if you do not have one) for your post hospital discharge needs so that they can reassess your need for medications and monitor your lab values.  Total Time spent coordinating discharge including counseling, education and face to face time equals greater than 30 minutes.  SignedDorcas Carrow 11/01/2023 10:35 AM

## 2023-11-01 NOTE — Progress Notes (Signed)
 Physical Therapy Treatment Patient Details Name: KEISEAN SKOWRON MRN: [id number] DOB: 30-Dec-1950 Today's Date: 11/01/2023   History of Present Illness 73 y.o. male admitted by transfer via EMS 2/25 from [facility] from [facility] ED for syncope. Dx: Shock, likely hypovolemic and DKA. PMHx:  type 1 diabetes, A-fib on Eliquis, cirrhosis, seizure disorder, aortic stenosis, hypertension, peptic ulcer disease, OA with bilateral knee replacement, polyclonal gammopathy, OSA on CPAP.    PT Comments  Patient is agreeable to PT session. He is eager to be discharged to rehab. Today the patient continues to require assistance with bed mobility. Patient able to stand with Max A with cues and facilitation for anterior weight shifting. Standing tolerance of around 20 seconds with external support required to maintain standing balance. Unable to take steps due to fatigue and generalized weakness. No dizziness reported with upright activity. MAP 72 while supine and MAP 74 while sitting. Recommend to continue PT to maximize independence. Rehabilitation < 3 hours/day recommended after this hospital stay.    If plan is discharge home, recommend the following: Two people to help with walking and/or transfers;A lot of help with bathing/dressing/bathroom;Assistance with cooking/housework;Direct supervision/assist for medications management;Direct supervision/assist for financial management;Assist for transportation;Help with stairs or ramp for entrance;Supervision due to cognitive status   Can travel by private vehicle     No  Equipment Recommendations  None recommended by PT    Recommendations for Other Services       Precautions / Restrictions Precautions Precautions: Fall Recall of Precautions/Restrictions: Impaired Precaution/Restrictions Comments: monitor BP Restrictions Weight Bearing Restrictions Per Provider Order: No     Mobility  Bed Mobility Overal bed mobility: Needs Assistance Bed  Mobility: Supine to Sit, Sit to Supine     Supine to sit: Mod assist Sit to supine: Max assist   General bed mobility comments: verbal cues for technique, sequencing, and task initiation. increased time and effort required with increased assistance required for return to bed    Transfers Overall transfer level: Needs assistance Equipment used: Rolling walker (2 wheels) Transfers: Sit to/from Stand Sit to Stand: Max assist, From elevated surface          Lateral/Scoot Transfers: Max assist General transfer comment: anterior weight shifting faciliation provided with cues for technique as patient with posterior bias with standing. Max A for incremental lateral scooting to the left with cues for technique    Ambulation/Gait             Pre-gait activities: standing tolerance of around 20 seconds with no dizziness reported with activity. emphasis on anterior weight shifting. unable to take steps due to generalized weakness     Stairs             Wheelchair Mobility     Tilt Bed    Modified Rankin (Stroke Patients Only)       Balance Overall balance assessment: Needs assistance Sitting-balance support: Feet supported, Single extremity supported Sitting balance-Leahy Scale: Poor Sitting balance - Comments: poor initially progressing to fair with increased sitting time Postural control: Posterior lean Standing balance support: Bilateral upper extremity supported, Reliant on assistive device for balance Standing balance-Leahy Scale: Poor Standing balance comment: external support required to maintain standing balance, posterior lean                            Communication Communication Communication: No apparent difficulties  Cognition Arousal: Alert Behavior During Therapy: [facility] for tasks assessed/performed  PT - Cognitive impairments: No family/caregiver present to determine baseline, Memory, Initiation, Sequencing, Problem solving, Awareness                        PT - Cognition Comments: tangential at times with cues for attention to task. some confusion about recent events Following commands: Impaired      Cueing Cueing Techniques: Verbal cues, Tactile cues  Exercises      General Comments General comments (skin integrity, edema, etc.): MAP while in bed was 72 and MAP with sitting was 73. no dizziness reported with mobility today      Pertinent Vitals/Pain Pain Assessment Pain Assessment: Faces Faces Pain Scale: Hurts a little bit Pain Location: right thigh Pain Descriptors / Indicators: Discomfort Pain Intervention(s): Repositioned, Monitored during session, Limited activity within patient's tolerance    Home Living                          Prior Function            PT Goals (current goals can now be found in the care plan section) Acute Rehab PT Goals Patient Stated Goal: to get to rehab PT Goal Formulation: With patient Time For Goal Achievement: 11/09/23 Potential to Achieve Goals: Good Progress towards PT goals: Progressing toward goals    Frequency    Min 1X/week      PT Plan      Co-evaluation              AM-PAC PT "6 Clicks" Mobility   Outcome Measure  Help needed turning from your back to your side while in a flat bed without using bedrails?: A Lot Help needed moving from lying on your back to sitting on the side of a flat bed without using bedrails?: A Lot Help needed moving to and from a bed to a chair (including a wheelchair)?: A Lot Help needed standing up from a chair using your arms (e.g., wheelchair or bedside chair)?: A Lot Help needed to walk in hospital room?: Total Help needed climbing 3-5 steps with a railing? : Total 6 Click Score: 10    End of Session   Activity Tolerance: Patient tolerated treatment well;Patient limited by fatigue Patient left: in bed;with call bell/phone within reach;with bed alarm set   PT Visit Diagnosis: Muscle weakness  (generalized) (M62.81);Difficulty in walking, not elsewhere classified (R26.2);History of falling (Z91.81);Other symptoms and signs involving the nervous system (R29.898);Dizziness and giddiness (R42);Pain     Time: 1322-1340 PT Time Calculation (min) (ACUTE ONLY): 18 min  Charges:    $Therapeutic Activity: 8-22 mins PT General Charges $$ ACUTE PT VISIT: 1 Visit                     Donna Bernard, PT, MPT    Ina Homes 11/01/2023, 1:49 PM

## 2023-11-01 NOTE — TOC Progression Note (Addendum)
 Transition of Care [facility]) - Progression Note    Patient Details  Name: Tony Cantu MRN: [id number] Date of Birth: 08-Mar-1951  Transition [facility]) CM/SW Contact  Mearl Latin, LCSW Phone Number: 11/01/2023, 8:27 AM  Clinical Narrative:    8:27 AM-[facility] awaiting insurance approval.   10am-[facility] liaison now stating insurance Berkley [name] has not been started yet but she will start it now.   1:12 PM-[facility] liaison stating they need an updated therapy note. CSW reached out to therapy group to see if anyone can see him today.   CSW met with patient and answered questions.   Expected Discharge Plan: Skilled Nursing Facility Barriers to Discharge: English as a second language teacher, SNF Pending bed offer  Expected Discharge Plan and Services In-house Referral: Clinical Social Work   Post Acute Care Choice: Skilled Nursing Facility Living arrangements for the past 2 months: Single Family Home, Skilled Nursing Facility                                       Social Determinants of Health (SDOH) Interventions SDOH Screenings   Food Insecurity: No Food Insecurity (10/30/2023)  Housing: High Risk (10/30/2023)  Transportation Needs: No Transportation Needs (10/30/2023)  Utilities: Not At Risk (10/30/2023)  Depression (PHQ2-9): Medium Risk (09/13/2023)  Financial Resource Strain: Low Risk  (06/09/2022)  Social Connections: Socially Isolated (10/30/2023)  Tobacco Use: Medium Risk (11/01/2023)    Readmission Risk Interventions     No data to display

## 2023-11-02 DIAGNOSIS — R579 Shock, unspecified: Secondary | ICD-10-CM | POA: Diagnosis not present

## 2023-11-02 DIAGNOSIS — R578 Other shock: Secondary | ICD-10-CM | POA: Diagnosis not present

## 2023-11-02 DIAGNOSIS — E0810 Diabetes mellitus due to underlying condition with ketoacidosis without coma: Secondary | ICD-10-CM | POA: Diagnosis not present

## 2023-11-02 DIAGNOSIS — Z794 Long term (current) use of insulin: Secondary | ICD-10-CM | POA: Diagnosis not present

## 2023-11-02 DIAGNOSIS — N182 Chronic kidney disease, stage 2 (mild): Secondary | ICD-10-CM | POA: Diagnosis not present

## 2023-11-02 DIAGNOSIS — I4891 Unspecified atrial fibrillation: Secondary | ICD-10-CM | POA: Diagnosis not present

## 2023-11-02 DIAGNOSIS — E1122 Type 2 diabetes mellitus with diabetic chronic kidney disease: Secondary | ICD-10-CM | POA: Diagnosis not present

## 2023-11-02 LAB — GLUCOSE, CAPILLARY
Glucose-Capillary: 266 mg/dL — ABNORMAL HIGH (ref 70–99)
Glucose-Capillary: 187 mg/dL — ABNORMAL HIGH (ref 70–99)
Glucose-Capillary: 293 mg/dL — ABNORMAL HIGH (ref 70–99)
Glucose-Capillary: 284 mg/dL — ABNORMAL HIGH (ref 70–99)

## 2023-11-02 NOTE — Progress Notes (Signed)
 Nutrition Follow-up  DOCUMENTATION CODES:   Severe malnutrition in context of chronic illness  INTERVENTION:  Continue with current diet. Continue ensure BID.    NUTRITION DIAGNOSIS:   Severe Malnutrition related to chronic illness (cirrhosis) as evidenced by severe fat depletion, severe muscle depletion.    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 Pt now admitted from SNF for DKA, AKI, and possible GI Bleed. Patient discussed in rounds no nutritional concerns mentioned. Labs and meds reviewed. Patient has been put back on restrictive diet, of low sodium heart healthy, carb modified.  Patient reports fair to good appetite,  enjoying ensures. Suspect current nutr poc providing adequate nutrition.   Significant events: 2/25>>transfer from [facility] for hematemesis c/f GI bleed and hypotensive needing levophed 2/26>> started on precedex overnight due to agitation, GI signed off 2/27>> Afib RVR on EKG, started amio infusion, converted to sinus  2/28>> Afib RVR, gave additional amio bolus, converted to sinus 2/29>> off levophed 3/03>> transferred to [facility]   Significant studies: 2/25>> CXR: No PNA.   Significant microbiology data: 2/26>> blood: No growth   Hospital weight history;  11/02/23 0500 85.5 kg 188.49 lbs  11/01/23 0421 87.2 kg 192.24 lbs  10/31/23 0500 87.6 kg 193.12 lbs  10/30/23 0500 88.9 kg 195.99 lbs  10/29/23 0710 89.6 kg 197.53 lbs  10/28/23 0500 84.8 kg 186.95 lbs  10/27/23 0500 84.8 kg 186.95 lbs   10/26/23 0500 84.8 kg 186.95 lbs  10/24/23 1709 81.3 kg 179.23 lbs   NUTRITION - FOCUSED PHYSICAL EXAM:  Flowsheet Row Most Recent Value  Orbital Region Severe depletion  Upper Arm Region Moderate depletion  Thoracic and Lumbar Region Mild depletion  Buccal Region Severe depletion  Temple Region Severe depletion  Clavicle Bone Region Severe depletion  Clavicle and Acromion Bone Region Severe depletion  Scapular Bone Region Severe depletion  Dorsal Hand Moderate depletion  Patellar Region Severe depletion  Anterior Thigh Region Severe depletion  Posterior Calf Region Severe depletion  Edema (RD Assessment) None  Hair Reviewed  Eyes Unable to assess  Mouth Unable to assess  Skin Reviewed  [dry]  Nails Reviewed       Diet Order:   Diet Order             Diet - low sodium heart healthy           Diet Carb Modified           Diet regular Room service appropriate? Yes with Assist; Fluid consistency: Thin  Diet effective now                   EDUCATION NEEDS:   Not appropriate for education at this time  Skin:  Skin Assessment: Reviewed RN Assessment  Last BM:  11/02/23 type 4  Height:   Ht Readings from Last 1 Encounters:  11/01/23 5\' 9"  (1.753 m)    Weight:   Wt Readings from Last 1 Encounters:  11/02/23 85.5 kg    Ideal Body Weight:     BMI:  Body mass index  is 27.84 kg/m.  Estimated Nutritional Needs:   Kcal:  2000-2200  Protein:  120-140 grams  Fluid:  >1.9 L/day    Jamelle Haring RDN, LDN Clinical Dietitian   If unable to reach, please contact "RD Inpatient" secure chat group between 8 am-4 pm daily"

## 2023-11-02 NOTE — TOC Progression Note (Addendum)
 Transition of Care [facility]) - Progression Note    Patient Details  Name: Tony Cantu MRN: [id number] Date of Birth: 30-Jul-1951  [facility]) CM/SW Contact  Mearl Latin, LCSW Phone Number: 11/02/2023, 8:55 AM  Clinical Narrative:    8:55 AM-[facility] awaiting insurance authorization.   POA, [name], updated.   Expected Discharge Plan: Skilled Nursing Facility Barriers to Discharge: English as a second language teacher, SNF Pending bed offer  Expected Discharge Plan and Services In-house Referral: Clinical Social Work   Post Acute Care Choice: Skilled Nursing Facility Living arrangements for the past 2 months: Single Family Home, Skilled Nursing Facility Expected Discharge Date: 11/01/23                                     Social Determinants of Health (SDOH) Interventions SDOH Screenings   Food Insecurity: No Food Insecurity (10/30/2023)  Housing: High Risk (10/30/2023)  Transportation Needs: No Transportation Needs (10/30/2023)  Utilities: Not At Risk (10/30/2023)  Depression (PHQ2-9): Medium Risk (09/13/2023)  Financial Resource Strain: Low Risk  (06/09/2022)  Social Connections: Socially Isolated (10/30/2023)  Tobacco Use: Medium Risk (11/01/2023)    Readmission Risk Interventions     No data to display

## 2023-11-02 NOTE — Progress Notes (Signed)
 PROGRESS NOTE    Tony Cantu  ZOX:[id number] DOB: [birth year] DOA: 10/24/2023 PCP: [name], DO    Brief Narrative:  Patient is a 73 y.o.  male with history of PAF, severe aortic stenosis, liver cirrhosis, DM-1, seizure disorder, OSA on CPAP-recent hospitalization from 1/21-1/09 for sepsis secondary to Streptococcus bacteremia-transferred to [facility] for evaluation of hypotension-patient was found to have AKI-possible GI bleeding-she was subsequently admitted to the ICU-have stabilized and transferred to [facility] services.  See below for further   Significant events: 2/25>>transfer from [facility] for hematemesis c/f GI bleed and hypotensive needing levophed 2/26>> started on precedex overnight due to agitation, GI signed off 2/27>> Afib RVR on EKG, started amio infusion, converted to sinus  2/28>> Afib RVR, gave additional amio bolus, converted to sinus 2/29>> off levophed 3/03>> transferred to [facility]   Significant studies: 2/25>> CXR: No PNA.   Significant microbiology data: 2/26>> blood: No growth   Procedures: None   Consults: PCCM GI  Subjective: Patient seen and examined.  Denies any complaints.  He talks about his personal relations today.  He is agreeable to go to rehab.  Waiting for insurance prior Auth.  Assessment & Plan:   Shock. Hypotension felt to be due to hypovolemia in the setting of DKA Acute blood loss/hemorrhagic shock/sepsis all ruled out Briefly required pressors while in the ICU. BP now stable.   DKA Treated with IV fluids/IV insulin-has been transition to SQ insulin. Blood sugars are better now. Continue similar doses as below.   DM-1 (A1c 4.4 on 1/21) Hypoglycemic event intermittently Resumed Lantus 20 units, 8 units of Novolog w meals and SSI May need further titration of medications.   PAF with RVR Rate controlled with amiodarone-now in sinus rhythm Initially on IV heparin infusion-but since no evidence of bleeding-switch to Eliquis  on 3/3. Outpatient cardiology follow-up arranged for 3/18 Patient had developed rapid A-fib, is currently on amiodarone loading.  Will have gradual tapering off amiodarone and remain on maintenance amiodarone as scheduled by pharmacy.    AKI Etiology likely hemodynamically mediated/osmotic mediated diuresis Creatinine has normalized with supportive care.   Severe aortic stenosis Known issue Outpatient cardiology follow-up arranged for 3/18   HTN BP-soft-not on any antihypertensives   HLD Statin   Hypophosphatemia Repleted   Hypomagnesemia Replete/recheck in 1 week.   Cirrhosis/portal hypertension Relatively stable/compensated Outpatient GI follow-up.   Thrombocytopenia Mild Follow periodically Could be due to acute illness or underlying cirrhosis.   Macrocytic anemia Likely due to acute illness superimposed on anemia of chronic disease. Monitor Hb-no evidence of acute bleeding    Recent history of streptococcal bacteremia Seems to have completed IV Rocephin-end of treatment was on 2/19.   History of upper GI AVMs No evidence of GI bleeding during this hospitalization-Hb stable Evaluated by GI and not felt to need any further endoscopy evaluation-has had EGD January 2025 (esophageal candidiasis/gastric AVMs/GAVE) and colonoscopy February 2024 with removal of 4 polyps and tubular adenoma/hyperplastic polyps)   Debility/deconditioning PT/OT-SNF planned   Nutrition Status: Nutrition Problem: Severe Malnutrition Etiology: chronic illness (cirrhosis) Signs/Symptoms: severe fat depletion, severe muscle depletion Interventions: Refer to RD note for recommendations   DVT prophylaxis: SCDs Start: 10/24/23 1735 apixaban (ELIQUIS) tablet 5 mg   Code Status: Full code Family Communication: None at the bedside Disposition Plan: Status is: Inpatient Remains inpatient appropriate because: Patient is medically stable.  Waiting for SNF bed.      Objective: Vitals:    11/02/23 0032 11/02/23 0400 11/02/23 0500  11/02/23 0800  BP: 104/61 114/61  (!) 104/49  Pulse: 69 67 78 78  Resp: 17 19 16 20   Temp: 98 F (36.7 C) 98.4 F (36.9 C)  98.1 F (36.7 C)  TempSrc: Oral Oral  Oral  SpO2: 96% 96% 96% 96%  Weight:   85.5 kg   Height:        Intake/Output Summary (Last 24 hours) at 11/02/2023 1014 Last data filed at 11/02/2023 0524 Gross per 24 hour  Intake 480 ml  Output 1500 ml  Net -1020 ml   Filed Weights   10/31/23 0500 11/01/23 0421 11/02/23 0500  Weight: 87.6 kg 87.2 kg 85.5 kg    Examination:  General exam: Appears calm and comfortable  Patient is frail and debilitated.  Looks fairly comfortable today.  Pleasant and interactive. Respiratory system: Clear to auscultation. Respiratory effort normal. Cardiovascular system: S1 & S2 heard, RRR.No pedal edema. Gastrointestinal system: Soft and nondistended.   Central nervous system: Alert and oriented. No focal neurological deficits. Grossly weak.  Needs 2 people to get out of the bed.  Pleasant interactive.    Data Reviewed: I have personally reviewed following labs and imaging studies  CBC: Recent Labs  Lab 10/27/23 0444 10/28/23 0436 10/29/23 0500 10/30/23 0328 11/01/23 0448  WBC 4.5 4.2 4.2 4.8 5.0  HGB 9.9* 10.0* 10.0* 9.0* 7.6*  HCT 28.9* 29.4* 29.7* 26.8* 22.9*  MCV 102.1* 102.4* 103.5* 105.9* 108.0*  PLT 87* 89* 96* 94* 90*   Basic Metabolic Panel: Recent Labs  Lab 10/27/23 0444 10/28/23 0436 10/29/23 0500 10/30/23 0328 10/31/23 0220 11/01/23 0448  NA 133* 134* 137 140 140 138  K 3.4* 3.4* 3.3* 4.0 3.7 3.8  CL 98 101 105 108 109 106  CO2 24 27 25 25 23 24   GLUCOSE 182* 192* 249* 153* 123* 189*  BUN 9 9 8 9  6* 10  CREATININE 0.82 0.78 0.83 0.81 0.70 0.72  CALCIUM 8.4* 8.2* 8.4* 9.2 9.2 8.9  MG 1.7 1.7 1.6* 1.8 1.6* 1.8  PHOS 1.8* 2.2* 2.5 1.8* 2.5  --    GFR: Estimated Creatinine Clearance: 87.8 mL/min (by C-G formula based on SCr of 0.72 mg/dL). Liver Function  Tests: No results for input(s): "AST", "ALT", "ALKPHOS", "BILITOT", "PROT", "ALBUMIN" in the last 168 hours. No results for input(s): "LIPASE", "AMYLASE" in the last 168 hours. No results for input(s): "AMMONIA" in the last 168 hours. Coagulation Profile: No results for input(s): "INR", "PROTIME" in the last 168 hours. Cardiac Enzymes: No results for input(s): "CKTOTAL", "CKMB", "CKMBINDEX", "TROPONINI" in the last 168 hours. BNP (last 3 results) No results for input(s): "PROBNP" in the last 8760 hours. HbA1C: No results for input(s): "HGBA1C" in the last 72 hours. CBG: Recent Labs  Lab 11/01/23 0747 11/01/23 1220 11/01/23 1622 11/01/23 2132 11/02/23 0758  GLUCAP 172* 216* 260* 184* 266*   Lipid Profile: No results for input(s): "CHOL", "HDL", "LDLCALC", "TRIG", "CHOLHDL", "LDLDIRECT" in the last 72 hours. Thyroid Function Tests: No results for input(s): "TSH", "T4TOTAL", "FREET4", "T3FREE", "THYROIDAB" in the last 72 hours. Anemia Panel: No results for input(s): "VITAMINB12", "FOLATE", "FERRITIN", "TIBC", "IRON", "RETICCTPCT" in the last 72 hours. Sepsis Labs: Recent Labs  Lab 10/26/23 1110 10/26/23 2028 10/26/23 2254  LATICACIDVEN 3.2* 2.6* 1.9    Recent Results (from the past 240 hours)  MRSA Next Gen by PCR, Nasal     Status: None   Collection Time: 10/24/23  5:09 PM   Specimen: Nasal Mucosa; Nasal Swab  Result Value  Ref Range Status   MRSA by PCR Next Gen NOT DETECTED NOT DETECTED Final    Comment: (NOTE) The GeneXpert MRSA Assay (FDA approved for NASAL specimens only), is one component of a comprehensive MRSA colonization surveillance program. It is not intended to diagnose MRSA infection nor to guide or monitor treatment for MRSA infections. Test performance is not FDA approved in patients less than 85 years old. Performed at [facility], [address] [place], [place] [address]   Culture, blood (Routine X 2) w Reflex to ID Panel     Status: None    Collection Time: 10/25/23  2:59 PM   Specimen: BLOOD RIGHT HAND  Result Value Ref Range Status   Specimen Description BLOOD RIGHT HAND  Final   Special Requests   Final    BOTTLES DRAWN AEROBIC ONLY Blood Culture results may not be optimal due to an inadequate volume of blood received in culture bottles   Culture   Final    NO GROWTH 5 DAYS Performed at [facility], [address], [place], [place] [address]    Report Status 10/30/2023 FINAL  Final  Culture, blood (Routine X 2) w Reflex to ID Panel     Status: None   Collection Time: 10/25/23  3:00 PM   Specimen: BLOOD  Result Value Ref Range Status   Specimen Description BLOOD RIGHT ANTECUBITAL  Final   Special Requests   Final    BOTTLES DRAWN AEROBIC AND ANAEROBIC Blood Culture results may not be optimal due to an inadequate volume of blood received in culture bottles   Culture   Final    NO GROWTH 5 DAYS Performed at [facility], [address], [place], [place] [address]    Report Status 10/30/2023 FINAL  Final         Radiology Studies: No results found.      Scheduled Meds:  amiodarone  400 mg Oral BID   Followed by   Melene Muller ON 11/03/2023] amiodarone  400 mg Oral Daily   Followed by   Melene Muller ON 11/08/2023] amiodarone  200 mg Oral Daily   apixaban  5 mg Oral BID   atorvastatin  40 mg Oral Daily   Chlorhexidine Gluconate Cloth  6 each Topical Daily   docusate sodium  100 mg Oral BID   feeding supplement (GLUCERNA SHAKE)  237 mL Oral TID BM   insulin aspart  0-20 Units Subcutaneous TID WC   insulin aspart  10 Units Subcutaneous TID WC   insulin glargine  18 Units Subcutaneous BID   montelukast  10 mg Oral QHS   multivitamin with minerals  1 tablet Oral Daily   pantoprazole  40 mg Oral BID   polyethylene glycol  17 g Oral Daily   sodium chloride flush  10-40 mL Intracatheter Q12H   sodium chloride flush  10-40 mL Intracatheter Q12H   Continuous Infusions:   LOS: 9 days    Time spent: 35  minutes    Dorcas Carrow, MD [facility]

## 2023-11-02 NOTE — Inpatient Diabetes Management (Signed)
 Inpatient Diabetes Program Recommendations  AACE/ADA: New Consensus Statement on Inpatient Glycemic Control (2015)  Target Ranges:  Prepandial:   less than 140 mg/dL      Peak postprandial:   less than 180 mg/dL (1-2 hours)      Critically ill patients:  140 - 180 mg/dL   Lab Results  Component Value Date   GLUCAP 187 (H) 11/02/2023   HGBA1C 4.4 (L) 09/19/2023    Review of Glycemic Control  Latest Reference Range & Units 11/01/23 07:47 11/01/23 12:20 11/01/23 16:22 11/01/23 21:32 11/02/23 07:58 11/02/23 11:22  Glucose-Capillary 70 - 99 mg/dL 914 (H) 782 (H) 956 (H) 184 (H) 266 (H) 187 (H)   Diabetes history: DM 2 Outpatient Diabetes medications: Novolog 2-15 units 4x/day, Novolog 8 units tid meal coverage, Toujeo 20 units Daily, metformin 1000 mg Daily Current orders for Inpatient glycemic control:  Lantus 18 units bid Novolog 0-20 units tid  Novolog 10 units tid meal coverage  Glucerna tid between meals (20 grams of carbohydrate)  Inpatient Diabetes Program Recommendations:    -   Increase Novolog meal coverage to 11-12 units tid if eating >50% of meals/Supplements  Thanks,  Christena Deem RN, MSN, BC-ADM Inpatient Diabetes Coordinator Team Pager [phone] (8a-5p)

## 2023-11-03 DIAGNOSIS — N390 Urinary tract infection, site not specified: Secondary | ICD-10-CM | POA: Diagnosis not present

## 2023-11-03 DIAGNOSIS — D500 Iron deficiency anemia secondary to blood loss (chronic): Secondary | ICD-10-CM | POA: Diagnosis not present

## 2023-11-03 DIAGNOSIS — E1169 Type 2 diabetes mellitus with other specified complication: Secondary | ICD-10-CM | POA: Diagnosis present

## 2023-11-03 DIAGNOSIS — D696 Thrombocytopenia, unspecified: Secondary | ICD-10-CM | POA: Diagnosis not present

## 2023-11-03 DIAGNOSIS — Z7901 Long term (current) use of anticoagulants: Secondary | ICD-10-CM | POA: Diagnosis not present

## 2023-11-03 DIAGNOSIS — K7290 Hepatic failure, unspecified without coma: Secondary | ICD-10-CM | POA: Diagnosis not present

## 2023-11-03 DIAGNOSIS — K7581 Nonalcoholic steatohepatitis (NASH): Secondary | ICD-10-CM | POA: Diagnosis not present

## 2023-11-03 DIAGNOSIS — K760 Fatty (change of) liver, not elsewhere classified: Secondary | ICD-10-CM | POA: Diagnosis not present

## 2023-11-03 DIAGNOSIS — E43 Unspecified severe protein-calorie malnutrition: Secondary | ICD-10-CM | POA: Diagnosis not present

## 2023-11-03 DIAGNOSIS — I350 Nonrheumatic aortic (valve) stenosis: Secondary | ICD-10-CM | POA: Diagnosis not present

## 2023-11-03 DIAGNOSIS — M6259 Muscle wasting and atrophy, not elsewhere classified, multiple sites: Secondary | ICD-10-CM | POA: Diagnosis not present

## 2023-11-03 DIAGNOSIS — E119 Type 2 diabetes mellitus without complications: Secondary | ICD-10-CM | POA: Diagnosis not present

## 2023-11-03 DIAGNOSIS — I5189 Other ill-defined heart diseases: Secondary | ICD-10-CM | POA: Diagnosis not present

## 2023-11-03 DIAGNOSIS — K31819 Angiodysplasia of stomach and duodenum without bleeding: Secondary | ICD-10-CM | POA: Diagnosis not present

## 2023-11-03 DIAGNOSIS — F5105 Insomnia due to other mental disorder: Secondary | ICD-10-CM | POA: Diagnosis not present

## 2023-11-03 DIAGNOSIS — R790 Abnormal level of blood mineral: Secondary | ICD-10-CM | POA: Diagnosis not present

## 2023-11-03 DIAGNOSIS — E1069 Type 1 diabetes mellitus with other specified complication: Secondary | ICD-10-CM | POA: Diagnosis not present

## 2023-11-03 DIAGNOSIS — R110 Nausea: Secondary | ICD-10-CM | POA: Diagnosis not present

## 2023-11-03 DIAGNOSIS — K7682 Hepatic encephalopathy: Secondary | ICD-10-CM | POA: Diagnosis not present

## 2023-11-03 DIAGNOSIS — J4489 Other specified chronic obstructive pulmonary disease: Secondary | ICD-10-CM | POA: Diagnosis present

## 2023-11-03 DIAGNOSIS — F32A Depression, unspecified: Secondary | ICD-10-CM | POA: Diagnosis not present

## 2023-11-03 DIAGNOSIS — E1122 Type 2 diabetes mellitus with diabetic chronic kidney disease: Secondary | ICD-10-CM | POA: Diagnosis not present

## 2023-11-03 DIAGNOSIS — R072 Precordial pain: Secondary | ICD-10-CM | POA: Diagnosis not present

## 2023-11-03 DIAGNOSIS — K648 Other hemorrhoids: Secondary | ICD-10-CM | POA: Diagnosis not present

## 2023-11-03 DIAGNOSIS — K7469 Other cirrhosis of liver: Secondary | ICD-10-CM | POA: Diagnosis not present

## 2023-11-03 DIAGNOSIS — K8689 Other specified diseases of pancreas: Secondary | ICD-10-CM | POA: Diagnosis not present

## 2023-11-03 DIAGNOSIS — K766 Portal hypertension: Secondary | ICD-10-CM | POA: Diagnosis not present

## 2023-11-03 DIAGNOSIS — I4891 Unspecified atrial fibrillation: Secondary | ICD-10-CM | POA: Diagnosis not present

## 2023-11-03 DIAGNOSIS — G40909 Epilepsy, unspecified, not intractable, without status epilepticus: Secondary | ICD-10-CM | POA: Diagnosis present

## 2023-11-03 DIAGNOSIS — D638 Anemia in other chronic diseases classified elsewhere: Secondary | ICD-10-CM | POA: Diagnosis not present

## 2023-11-03 DIAGNOSIS — K921 Melena: Secondary | ICD-10-CM | POA: Diagnosis not present

## 2023-11-03 DIAGNOSIS — I10 Essential (primary) hypertension: Secondary | ICD-10-CM | POA: Diagnosis not present

## 2023-11-03 DIAGNOSIS — Z87442 Personal history of urinary calculi: Secondary | ICD-10-CM | POA: Diagnosis not present

## 2023-11-03 DIAGNOSIS — Z860101 Personal history of adenomatous and serrated colon polyps: Secondary | ICD-10-CM | POA: Diagnosis not present

## 2023-11-03 DIAGNOSIS — Z7409 Other reduced mobility: Secondary | ICD-10-CM | POA: Diagnosis not present

## 2023-11-03 DIAGNOSIS — Z7401 Bed confinement status: Secondary | ICD-10-CM | POA: Diagnosis not present

## 2023-11-03 DIAGNOSIS — E11649 Type 2 diabetes mellitus with hypoglycemia without coma: Secondary | ICD-10-CM | POA: Diagnosis not present

## 2023-11-03 DIAGNOSIS — M6281 Muscle weakness (generalized): Secondary | ICD-10-CM | POA: Diagnosis not present

## 2023-11-03 DIAGNOSIS — R52 Pain, unspecified: Secondary | ICD-10-CM | POA: Diagnosis not present

## 2023-11-03 DIAGNOSIS — Z7189 Other specified counseling: Secondary | ICD-10-CM | POA: Diagnosis not present

## 2023-11-03 DIAGNOSIS — K922 Gastrointestinal hemorrhage, unspecified: Secondary | ICD-10-CM | POA: Diagnosis not present

## 2023-11-03 DIAGNOSIS — N400 Enlarged prostate without lower urinary tract symptoms: Secondary | ICD-10-CM | POA: Diagnosis not present

## 2023-11-03 DIAGNOSIS — R5383 Other fatigue: Secondary | ICD-10-CM | POA: Diagnosis not present

## 2023-11-03 DIAGNOSIS — I5032 Chronic diastolic (congestive) heart failure: Secondary | ICD-10-CM | POA: Diagnosis present

## 2023-11-03 DIAGNOSIS — K3189 Other diseases of stomach and duodenum: Secondary | ICD-10-CM | POA: Diagnosis not present

## 2023-11-03 DIAGNOSIS — F331 Major depressive disorder, recurrent, moderate: Secondary | ICD-10-CM | POA: Diagnosis not present

## 2023-11-03 DIAGNOSIS — R768 Other specified abnormal immunological findings in serum: Secondary | ICD-10-CM | POA: Diagnosis not present

## 2023-11-03 DIAGNOSIS — N200 Calculus of kidney: Secondary | ICD-10-CM | POA: Diagnosis not present

## 2023-11-03 DIAGNOSIS — Z96611 Presence of right artificial shoulder joint: Secondary | ICD-10-CM | POA: Diagnosis not present

## 2023-11-03 DIAGNOSIS — R42 Dizziness and giddiness: Secondary | ICD-10-CM | POA: Diagnosis not present

## 2023-11-03 DIAGNOSIS — K5730 Diverticulosis of large intestine without perforation or abscess without bleeding: Secondary | ICD-10-CM | POA: Diagnosis not present

## 2023-11-03 DIAGNOSIS — E1065 Type 1 diabetes mellitus with hyperglycemia: Secondary | ICD-10-CM | POA: Diagnosis not present

## 2023-11-03 DIAGNOSIS — J449 Chronic obstructive pulmonary disease, unspecified: Secondary | ICD-10-CM | POA: Diagnosis not present

## 2023-11-03 DIAGNOSIS — R7989 Other specified abnormal findings of blood chemistry: Secondary | ICD-10-CM | POA: Diagnosis not present

## 2023-11-03 DIAGNOSIS — Z66 Do not resuscitate: Secondary | ICD-10-CM | POA: Diagnosis not present

## 2023-11-03 DIAGNOSIS — L89153 Pressure ulcer of sacral region, stage 3: Secondary | ICD-10-CM | POA: Diagnosis not present

## 2023-11-03 DIAGNOSIS — I6782 Cerebral ischemia: Secondary | ICD-10-CM | POA: Diagnosis not present

## 2023-11-03 DIAGNOSIS — I152 Hypertension secondary to endocrine disorders: Secondary | ICD-10-CM | POA: Diagnosis not present

## 2023-11-03 DIAGNOSIS — D649 Anemia, unspecified: Secondary | ICD-10-CM | POA: Diagnosis not present

## 2023-11-03 DIAGNOSIS — E1165 Type 2 diabetes mellitus with hyperglycemia: Secondary | ICD-10-CM | POA: Diagnosis not present

## 2023-11-03 DIAGNOSIS — R509 Fever, unspecified: Secondary | ICD-10-CM | POA: Diagnosis not present

## 2023-11-03 DIAGNOSIS — D539 Nutritional anemia, unspecified: Secondary | ICD-10-CM | POA: Diagnosis not present

## 2023-11-03 DIAGNOSIS — G9341 Metabolic encephalopathy: Secondary | ICD-10-CM | POA: Diagnosis not present

## 2023-11-03 DIAGNOSIS — G3184 Mild cognitive impairment, so stated: Secondary | ICD-10-CM | POA: Diagnosis not present

## 2023-11-03 DIAGNOSIS — E1010 Type 1 diabetes mellitus with ketoacidosis without coma: Secondary | ICD-10-CM | POA: Diagnosis not present

## 2023-11-03 DIAGNOSIS — K7460 Unspecified cirrhosis of liver: Secondary | ICD-10-CM | POA: Diagnosis not present

## 2023-11-03 DIAGNOSIS — I959 Hypotension, unspecified: Secondary | ICD-10-CM | POA: Diagnosis not present

## 2023-11-03 DIAGNOSIS — R2681 Unsteadiness on feet: Secondary | ICD-10-CM | POA: Diagnosis not present

## 2023-11-03 DIAGNOSIS — K2100 Gastro-esophageal reflux disease with esophagitis, without bleeding: Secondary | ICD-10-CM | POA: Diagnosis not present

## 2023-11-03 DIAGNOSIS — K567 Ileus, unspecified: Secondary | ICD-10-CM | POA: Diagnosis not present

## 2023-11-03 DIAGNOSIS — E785 Hyperlipidemia, unspecified: Secondary | ICD-10-CM | POA: Diagnosis not present

## 2023-11-03 DIAGNOSIS — E559 Vitamin D deficiency, unspecified: Secondary | ICD-10-CM | POA: Diagnosis not present

## 2023-11-03 DIAGNOSIS — K429 Umbilical hernia without obstruction or gangrene: Secondary | ICD-10-CM | POA: Diagnosis not present

## 2023-11-03 DIAGNOSIS — G9340 Encephalopathy, unspecified: Secondary | ICD-10-CM | POA: Diagnosis not present

## 2023-11-03 DIAGNOSIS — E1159 Type 2 diabetes mellitus with other circulatory complications: Secondary | ICD-10-CM | POA: Diagnosis not present

## 2023-11-03 DIAGNOSIS — N182 Chronic kidney disease, stage 2 (mild): Secondary | ICD-10-CM | POA: Diagnosis not present

## 2023-11-03 DIAGNOSIS — E669 Obesity, unspecified: Secondary | ICD-10-CM | POA: Diagnosis present

## 2023-11-03 DIAGNOSIS — G479 Sleep disorder, unspecified: Secondary | ICD-10-CM | POA: Diagnosis not present

## 2023-11-03 DIAGNOSIS — E870 Hyperosmolality and hypernatremia: Secondary | ICD-10-CM | POA: Diagnosis not present

## 2023-11-03 DIAGNOSIS — F419 Anxiety disorder, unspecified: Secondary | ICD-10-CM | POA: Diagnosis not present

## 2023-11-03 DIAGNOSIS — K31811 Angiodysplasia of stomach and duodenum with bleeding: Secondary | ICD-10-CM | POA: Diagnosis present

## 2023-11-03 DIAGNOSIS — Z4682 Encounter for fitting and adjustment of non-vascular catheter: Secondary | ICD-10-CM | POA: Diagnosis not present

## 2023-11-03 DIAGNOSIS — R4182 Altered mental status, unspecified: Secondary | ICD-10-CM | POA: Diagnosis not present

## 2023-11-03 DIAGNOSIS — G4089 Other seizures: Secondary | ICD-10-CM | POA: Diagnosis not present

## 2023-11-03 DIAGNOSIS — R0989 Other specified symptoms and signs involving the circulatory and respiratory systems: Secondary | ICD-10-CM | POA: Diagnosis not present

## 2023-11-03 DIAGNOSIS — I700 Atherosclerosis of aorta: Secondary | ICD-10-CM | POA: Diagnosis not present

## 2023-11-03 DIAGNOSIS — Z8719 Personal history of other diseases of the digestive system: Secondary | ICD-10-CM | POA: Diagnosis not present

## 2023-11-03 DIAGNOSIS — Z881 Allergy status to other antibiotic agents status: Secondary | ICD-10-CM | POA: Diagnosis not present

## 2023-11-03 DIAGNOSIS — E872 Acidosis, unspecified: Secondary | ICD-10-CM | POA: Diagnosis present

## 2023-11-03 DIAGNOSIS — F411 Generalized anxiety disorder: Secondary | ICD-10-CM | POA: Diagnosis not present

## 2023-11-03 DIAGNOSIS — I8500 Esophageal varices without bleeding: Secondary | ICD-10-CM | POA: Diagnosis not present

## 2023-11-03 DIAGNOSIS — D62 Acute posthemorrhagic anemia: Secondary | ICD-10-CM | POA: Diagnosis present

## 2023-11-03 DIAGNOSIS — I480 Paroxysmal atrial fibrillation: Secondary | ICD-10-CM | POA: Diagnosis not present

## 2023-11-03 DIAGNOSIS — G4730 Sleep apnea, unspecified: Secondary | ICD-10-CM | POA: Diagnosis not present

## 2023-11-03 DIAGNOSIS — Z794 Long term (current) use of insulin: Secondary | ICD-10-CM | POA: Diagnosis not present

## 2023-11-03 DIAGNOSIS — R5381 Other malaise: Secondary | ICD-10-CM | POA: Diagnosis not present

## 2023-11-03 DIAGNOSIS — D7589 Other specified diseases of blood and blood-forming organs: Secondary | ICD-10-CM | POA: Diagnosis not present

## 2023-11-03 DIAGNOSIS — J45909 Unspecified asthma, uncomplicated: Secondary | ICD-10-CM | POA: Diagnosis not present

## 2023-11-03 DIAGNOSIS — R638 Other symptoms and signs concerning food and fluid intake: Secondary | ICD-10-CM | POA: Diagnosis not present

## 2023-11-03 DIAGNOSIS — R531 Weakness: Secondary | ICD-10-CM | POA: Diagnosis not present

## 2023-11-03 DIAGNOSIS — Z79899 Other long term (current) drug therapy: Secondary | ICD-10-CM | POA: Diagnosis not present

## 2023-11-03 DIAGNOSIS — Z1152 Encounter for screening for COVID-19: Secondary | ICD-10-CM | POA: Diagnosis not present

## 2023-11-03 DIAGNOSIS — E1110 Type 2 diabetes mellitus with ketoacidosis without coma: Secondary | ICD-10-CM | POA: Diagnosis not present

## 2023-11-03 DIAGNOSIS — G4733 Obstructive sleep apnea (adult) (pediatric): Secondary | ICD-10-CM | POA: Diagnosis not present

## 2023-11-03 DIAGNOSIS — E876 Hypokalemia: Secondary | ICD-10-CM | POA: Diagnosis not present

## 2023-11-03 DIAGNOSIS — M2550 Pain in unspecified joint: Secondary | ICD-10-CM | POA: Diagnosis not present

## 2023-11-03 DIAGNOSIS — K920 Hematemesis: Secondary | ICD-10-CM | POA: Diagnosis not present

## 2023-11-03 DIAGNOSIS — R195 Other fecal abnormalities: Secondary | ICD-10-CM | POA: Diagnosis not present

## 2023-11-03 LAB — GLUCOSE, CAPILLARY
Glucose-Capillary: 359 mg/dL — ABNORMAL HIGH (ref 70–99)
Glucose-Capillary: 325 mg/dL — ABNORMAL HIGH (ref 70–99)

## 2023-11-03 MED ORDER — INSULIN GLARGINE 100 UNIT/ML ~~LOC~~ SOLN: 20.0000 [IU] | Freq: Two times a day (BID) | SUBCUTANEOUS | Status: DC

## 2023-11-03 MED ORDER — AMIODARONE HCL 200 MG PO TABS: ORAL_TABLET | ORAL | Status: DC

## 2023-11-03 MED ORDER — RAMELTEON 8 MG PO TABS: 8.0000 mg | ORAL_TABLET | Freq: Every day | ORAL | 0 refills | Status: DC

## 2023-11-03 MED ORDER — INSULIN GLARGINE 100 UNIT/ML ~~LOC~~ SOLN
20.0000 [IU] | Freq: Two times a day (BID) | SUBCUTANEOUS | Status: DC
  Filled 2023-11-03: qty 0.2

## 2023-11-03 NOTE — TOC Transition Note (Signed)
 Transition of Care [facility]) - Discharge Note   Patient Details  Name: Tony Cantu MRN: [id number] Date of Birth: 1950-10-10  Transition of Care [facility]) CM/SW Contact:  [name], RN Phone Number: 11/03/2023, 4:19 PM   Clinical Narrative:    Patient will DC to: [facility] Anticipated DC date: 11/03/2023 Family notified: [name] by: [name]   Per MD patient ready for DC to [facility] . RN to call report prior to discharge [phone] Rm# [id number]. RN, patient, patient's family, and facility notified of DC. Discharge Summary and FL2 sent to facility. DC packet on chart. Ambulance transport requested for patient.   Case Manger will sign off for now as no additional needs identified. Please consult [place] again if new needs arise.     Final next level of care: Skilled Nursing Facility Barriers to Discharge: Barriers Resolved   Patient Goals and CMS Choice Patient states their goals for this hospitalization and ongoing recovery are:: SNF CMS Medicare.gov Compare Post Acute Care list provided to:: Patient Represenative (must comment) Choice offered to / list presented to : [facility] POA / Guardian (ew-wife/hcpoa) [name] ownership interest in [facility].provided to:: [facility] POA / Guardian    Discharge Placement   Existing PASRR number confirmed : 11/03/23          Patient chooses bed at: [facility] Patient to be transferred to facility by: [facility] Name of family member notified: [name] Patient and family notified of of transfer: 11/03/23  Discharge Plan and Services Additional resources added to the After Visit Summary for   In-house Referral: Clinical Social Work   Post Acute Care Choice: Skilled Nursing Facility          DME Arranged: N/A DME Agency: NA       HH Arranged: NA HH Agency: NA        Social Drivers of Health (SDOH) Interventions SDOH Screenings   Food Insecurity: No Food Insecurity (10/30/2023)  Housing: High Risk  (10/30/2023)  Transportation Needs: No Transportation Needs (10/30/2023)  Utilities: Not At Risk (10/30/2023)  Depression (PHQ2-9): Medium Risk (09/13/2023)  Financial Resource Strain: Low Risk  (06/09/2022)  Social Connections: Socially Isolated (10/30/2023)  Tobacco Use: Medium Risk (11/01/2023)     Readmission Risk Interventions     No data to display

## 2023-11-03 NOTE — TOC Progression Note (Addendum)
 Transition of Care [facility]) - Progression Note    Patient Details  Name: Tony Cantu MRN: [id number] Date of Birth: 1950-12-24  Transition of Care [facility]) CM/SW Contact  Mearl Latin, LCSW Phone Number: 11/03/2023, 8:27 AM  Clinical Narrative:    8:27 AM-[facility] still awaiting insurance approval for rehab.   1:34 PM-[facility] has received insurance approval.   Expected Discharge Plan: Skilled Nursing Facility Barriers to Discharge: Insurance Authorization, SNF Pending bed offer  Expected Discharge Plan and Services In-house Referral: Clinical Social Work   Post Acute Care Choice: Skilled Nursing Facility Living arrangements for the past 2 months: Single Family Home, Skilled Nursing Facility Expected Discharge Date: 11/01/23                                     Social Determinants of Health (SDOH) Interventions SDOH Screenings   Food Insecurity: No Food Insecurity (10/30/2023)  Housing: High Risk (10/30/2023)  Transportation Needs: No Transportation Needs (10/30/2023)  Utilities: Not At Risk (10/30/2023)  Depression (PHQ2-9): Medium Risk (09/13/2023)  Financial Resource Strain: Low Risk  (06/09/2022)  Social Connections: Socially Isolated (10/30/2023)  Tobacco Use: Medium Risk (11/01/2023)    Readmission Risk Interventions     No data to display

## 2023-11-03 NOTE — TOC Progression Note (Signed)
 Transition of Care [facility]) - Progression Note    Patient Details  Name: ROMMEL HOGSTON MRN: [id number] Date of Birth: 1951/05/01  Transition of Care [facility]) CM/SW Contact  [name], RN Phone Number: 11/03/2023, 2:14 PM  Clinical Narrative:     Marlane Hatcher called and informed of patients discharge for today via [facility].  Expected Discharge Plan: Skilled Nursing Facility Barriers to Discharge: Barriers Resolved  Expected Discharge Plan and Services In-house Referral: Clinical Social Work   Post Acute Care Choice: Skilled Nursing Facility Living arrangements for the past 2 months: Single Family Home, Skilled Nursing Facility Expected Discharge Date: 11/03/23                                     Social Determinants of Health (SDOH) Interventions SDOH Screenings   Food Insecurity: No Food Insecurity (10/30/2023)  Housing: High Risk (10/30/2023)  Transportation Needs: No Transportation Needs (10/30/2023)  Utilities: Not At Risk (10/30/2023)  Depression (PHQ2-9): Medium Risk (09/13/2023)  Financial Resource Strain: Low Risk  (06/09/2022)  Social Connections: Socially Isolated (10/30/2023)  Tobacco Use: Medium Risk (11/01/2023)    Readmission Risk Interventions     No data to display

## 2023-11-03 NOTE — Discharge Summary (Signed)
 [facility]  Physician Discharge Summary   Patient ID: Tony Cantu MRN: [id number] DOB/AGE: 1951/06/22 73 y.o.  Admit date: 10/24/2023 Discharge date:   11/03/2023   PCP: [name], DO  DISCHARGE DIAGNOSES:     DKA (diabetic ketoacidosis) (HCC)   GI bleed   Hemorrhagic shock (HCC)   Protein-calorie malnutrition, severe   Atrial fibrillation with rapid ventricular response (HCC)   RECOMMENDATIONS FOR OUTPATIENT FOLLOW UP: CBC and BMET in 3 days    Home Health:SNF  Equipment/Devices:None   CODE STATUS:Full Code   DISCHARGE CONDITION: fair  Diet recommendation: Carb modified  INITIAL HISTORY: Patient is a 73 y.o.  male with history of PAF, severe aortic stenosis, liver cirrhosis, DM-1, seizure disorder, OSA on CPAP-recent hospitalization from 1/21-1/09 for sepsis secondary to Streptococcus bacteremia-transferred to [facility] for evaluation of hypotension-patient was found to have AKI-possible GI bleeding-she was subsequently admitted to the ICU-have stabilized and transferred to [facility].  See below for further   Significant events: 2/25>>transfer from [facility] for hematemesis c/f GI bleed and hypotensive needing levophed 2/26>> started on precedex overnight due to agitation, GI signed off 2/27>> Afib RVR on EKG, started amio infusion, converted to sinus  2/28>> Afib RVR, gave additional amio bolus, converted to sinus 2/29>> off levophed 3/03>> transferred to [facility]   Significant studies: 2/25>> CXR: No PNA.   Significant microbiology data: 2/26>> blood: No growth   Procedures: None   Consults: PCCM GI  HOSPITAL COURSE:   Shock. Hypotension felt to be due to hypovolemia in the setting of DKA Acute blood loss/hemorrhagic shock/sepsis all ruled out Briefly required pressors while in the ICU. BP now stable.   DKA Treated with IV fluids/IV insulin-has been transition to SQ insulin. Blood sugars are better now.    DM-1 (A1c 4.4 on  1/21) Hypoglycemic event intermittently Resumed Lantus 20 units, 8 units of Novolog w meals and SSI CBGs are better but not optimal.  Will increase glargine to 20 units twice a day.   PAF with RVR Rate controlled with amiodarone-now in sinus rhythm Initially on IV heparin infusion-but since no evidence of bleeding-switched to Eliquis on 3/3. Outpatient cardiology follow-up arranged for 3/18 Patient had developed rapid A-fib, is currently on amiodarone loading.  Will have gradual tapering off amiodarone and remain on maintenance amiodarone as scheduled by pharmacy.    AKI Etiology likely hemodynamically mediated/osmotic mediated diuresis Creatinine has normalized with supportive care.   Severe aortic stenosis Known issue Outpatient cardiology follow-up arranged for 3/18   HTN BP-soft-not on any antihypertensives   HLD Statin   Hypophosphatemia Repleted   Hypomagnesemia Replete/recheck in 1 week.   Cirrhosis/portal hypertension Relatively stable/compensated Outpatient GI follow-up.   Thrombocytopenia Mild Follow periodically Could be due to acute illness or underlying cirrhosis.   Macrocytic anemia Likely due to acute illness superimposed on anemia of chronic disease. Some drift down noted but no evidence of acute bleeding. Can be checked next at [facility].   Recent history of streptococcal bacteremia Seems to have completed IV Rocephin-end of treatment was on 2/19.   History of upper GI AVMs No evidence of GI bleeding during this hospitalization-Hb stable Evaluated by GI and not felt to need any further endoscopy evaluation-has had EGD January 2025 (esophageal candidiasis/gastric AVMs/GAVE) and colonoscopy February 2024 with removal of 4 polyps and tubular adenoma/hyperplastic polyps)   Debility/deconditioning PT/OT-SNF planned   Nutrition Status: Nutrition Problem: Severe Malnutrition Etiology: chronic illness (cirrhosis) Signs/Symptoms: severe fat depletion, severe  muscle depletion Interventions:  Refer to RD note for recommendations    Patient is stable. Ok for discharge today to SNF.   PERTINENT LABS:  The results of significant diagnostics from this hospitalization (including imaging, microbiology, ancillary and laboratory) are listed below for reference.    Microbiology: Recent Results (from the past 240 hours)  MRSA Next Gen by PCR, Nasal     Status: None   Collection Time: 10/24/23  5:09 PM   Specimen: Nasal Mucosa; Nasal Swab  Result Value Ref Range Status   MRSA by PCR Next Gen NOT DETECTED NOT DETECTED Final    Comment: (NOTE) The GeneXpert MRSA Assay (FDA approved for NASAL specimens only), is one component of a comprehensive MRSA colonization surveillance program. It is not intended to diagnose MRSA infection nor to guide or monitor treatment for MRSA infections. Test performance is not FDA approved in patients less than 58 years old. Performed at [facility], [address], [place], [place] [address]   Culture, blood (Routine X 2) w Reflex to ID Panel     Status: None   Collection Time: 10/25/23  2:59 PM   Specimen: BLOOD RIGHT HAND  Result Value Ref Range Status   Specimen Description BLOOD RIGHT HAND  Final   Special Requests   Final    BOTTLES DRAWN AEROBIC ONLY Blood Culture results may not be optimal due to an inadequate volume of blood received in culture bottles   Culture   Final    NO GROWTH 5 DAYS Performed at [facility], [address], [place], [place] [address]    Report Status 10/30/2023 FINAL  Final  Culture, blood (Routine X 2) w Reflex to ID Panel     Status: None   Collection Time: 10/25/23  3:00 PM   Specimen: BLOOD  Result Value Ref Range Status   Specimen Description BLOOD RIGHT ANTECUBITAL  Final   Special Requests   Final    BOTTLES DRAWN AEROBIC AND ANAEROBIC Blood Culture results may not be optimal due to an inadequate volume of blood received in culture bottles   Culture   Final     NO GROWTH 5 DAYS Performed at [facility], [address] [place], [place] [address]    Report Status 10/30/2023 FINAL  Final     Labs:   Basic Metabolic Panel: Recent Labs  Lab 10/28/23 0436 10/29/23 0500 10/30/23 0328 10/31/23 0220 11/01/23 0448  NA 134* 137 140 140 138  K 3.4* 3.3* 4.0 3.7 3.8  CL 101 105 108 109 106  CO2 27 25 25 23 24   GLUCOSE 192* 249* 153* 123* 189*  BUN 9 8 9  6* 10  CREATININE 0.78 0.83 0.81 0.70 0.72  CALCIUM 8.2* 8.4* 9.2 9.2 8.9  MG 1.7 1.6* 1.8 1.6* 1.8  PHOS 2.2* 2.5 1.8* 2.5  --     CBC: Recent Labs  Lab 10/28/23 0436 10/29/23 0500 10/30/23 0328 11/01/23 0448  WBC 4.2 4.2 4.8 5.0  HGB 10.0* 10.0* 9.0* 7.6*  HCT 29.4* 29.7* 26.8* 22.9*  MCV 102.4* 103.5* 105.9* 108.0*  PLT 89* 96* 94* 90*    CBG: Recent Labs  Lab 11/02/23 1122 11/02/23 1654 11/02/23 2111 11/03/23 1029 11/03/23 1227  GLUCAP 187* 293* 284* 359* 325*     IMAGING STUDIES DG CHEST PORT 1 VIEW Result Date: 10/24/2023 CLINICAL DATA:  Gastrointestinal bleeding, shock EXAM: PORTABLE CHEST 1 VIEW COMPARISON:  10/23/2023 FINDINGS: Single frontal view of the chest demonstrates stable nerve stimulator  overlying the right chest, lead extending to the right submandibular region. The cardiac silhouette is unremarkable. Chronic elevation of the right hemidiaphragm. No acute airspace disease, effusion, or pneumothorax. Bilateral shoulder arthroplasties. IMPRESSION: 1. Stable chest, no acute intrathoracic process. Electronically Signed   By: Sharlet Salina M.D.   On: 10/24/2023 22:01   [place] EKG SITE RITE Result Date: 10/24/2023 If [facility] image not attached, placement could not be confirmed due to current cardiac rhythm.   DISCHARGE EXAMINATION: See progress note from earlier today.  DISPOSITION: SNF  Discharge Instructions     Diet - low sodium heart healthy   Complete by: As directed    Diet Carb Modified   Complete by: As directed    Discharge  instructions   Complete by: As directed    Follow with Primary MD  [name], DO in 1-2 weeks  Please get a complete blood count and chemistry panel checked by your Primary MD at your next visit, and again as instructed by your Primary MD.  Get Medicines reviewed and adjusted: Please take all your medications with you for your next visit with your Primary MD  Laboratory/radiological data: Please request your Primary MD to go over all hospital tests and procedure/radiological results at the follow up, please ask your Primary MD to get all Hospital records sent to his/her office.  In some cases, they will be blood work, cultures and biopsy results pending at the time of your discharge. Please request that your primary care M.D. follows up on these results.  Also Note the following: If you experience worsening of your admission symptoms, develop shortness of breath, life threatening emergency, suicidal or homicidal thoughts you must seek medical attention immediately by calling 911 or calling your MD immediately  if symptoms less severe.  You must read complete instructions/literature along with all the possible adverse reactions/side effects for all the Medicines you take and that have been prescribed to you. Take any new Medicines after you have completely understood and accpet all the possible adverse reactions/side effects.   Do not drive when taking Pain medications or sleeping medications (Benzodaizepines)  Do not take more than prescribed Pain, Sleep and Anxiety Medications. It is not advisable to combine anxiety,sleep and pain medications without talking with your primary care practitioner  Special Instructions: If you have smoked or chewed Tobacco  in the last 2 yrs please stop smoking, stop any regular Alcohol  and or any Recreational drug use.  Wear Seat belts while driving.  Please note: You were cared for by a hospitalist during your hospital stay. Once you are  discharged, your primary care physician will handle any further medical issues. Please note that NO REFILLS for any discharge medications will be authorized once you are discharged, as it is imperative that you return to your primary care physician (or establish a relationship with a primary care physician if you do not have one) for your post hospital discharge needs so that they can reassess your need for medications and monitor your lab values.   Check CBG's before meals and bedtime   Increase activity slowly   Complete by: As directed          Allergies as of 11/03/2023       Reactions   Dimetapp Children's Cold-cough Other (See Comments)   Chest discomfort    Erythromycin Diarrhea   Sulfonamide Derivatives Diarrhea        Medication List     STOP taking these medications  cefTRIAXone IVPB Commonly known as: ROCEPHIN   CHROMIUM PICOLATE PO   CINNAMON PO   CoQ10 100 MG Caps   diltiazem 30 MG tablet Commonly known as: CARDIZEM   fluconazole 200 MG tablet Commonly known as: DIFLUCAN   furosemide 20 MG tablet Commonly known as: LASIX   Glucosamine 1500 Complex Caps   Heparin Na (Pork) Lock Flsh PF 100 UNIT/ML Soln   metoprolol succinate 25 MG 24 hr tablet Commonly known as: TOPROL-XL   potassium chloride SA 20 MEQ tablet Commonly known as: KLOR-CON M   Toujeo Max SoloStar 300 UNIT/ML Solostar Pen Generic drug: insulin glargine (2 Unit Dial) Replaced by: insulin glargine 100 UNIT/ML injection   Turmeric Curcumin 500 MG Caps   valsartan 40 MG tablet Commonly known as: DIOVAN       TAKE these medications    acetaminophen 500 MG tablet Commonly known as: TYLENOL Take 1,000 mg by mouth every 6 (six) hours as needed for moderate pain.   amiodarone 200 MG tablet Commonly known as: PACERONE 400 mg  po daily from 3/7 to 3/11 200 mg  po daily from 3/12   apixaban 5 MG Tabs tablet Commonly known as: ELIQUIS Take 1 tablet (5 mg total) by mouth 2  (two) times daily.   atorvastatin 40 MG tablet Commonly known as: LIPITOR Take 1 tablet (40 mg total) by mouth daily.   b complex vitamins tablet Take 1 tablet by mouth daily.   Contour Next One Kit Test BS QID and as needed Dx E11.9   Contour Next Test test strip Generic drug: glucose blood Test BS QID and as needed Dx E11.9   dextromethorphan-guaiFENesin 10-100 MG/5ML liquid Commonly known as: ROBITUSSIN-DM Take 10 mLs by mouth in the morning and at bedtime.   Dialyvite Vitamin D 5000 125 MCG (5000 UT) capsule Generic drug: Cholecalciferol Take 5,000 Units by mouth daily.   cholecalciferol 25 MCG (1000 UNIT) tablet Commonly known as: VITAMIN D3 Take 5,000 Units by mouth daily.   ferrous sulfate 325 (65 FE) MG EC tablet Take 1 tablet (325 mg total) by mouth daily with breakfast.   FreeStyle Libre Sensor System Misc Check BS eight (8) times a day. Dx E10.9   Glucagon Emergency 1 MG Kit INJECT AS DIRECTED INTO  UPPER ARM, THIGH OR  BUTTOCKS AS NEEDED FOR  SEVERE HYPOGLYCEMIA. SEEK  MEDICAL ATTENTION AFTER USE   insulin aspart 100 UNIT/ML injection Commonly known as: novoLOG Inject 8 Units into the skin 3 (three) times daily before meals.   insulin aspart 100 UNIT/ML injection Commonly known as: novoLOG Inject 2-15 Units into the skin 4 (four) times daily -  before meals and at bedtime. CBG 121 - 150: 2 units, CBG 151 - 200: 3 units, CBG 201 - 250: 5 units, CBG 251 - 300: 8 units, CBG 301 - 350: 11 units, CBG 351 - 400: 15 units, CBG > 400call MD   insulin glargine 100 UNIT/ML injection Commonly known as: LANTUS Inject 0.2 mLs (20 Units total) into the skin 2 (two) times daily. Replaces: Toujeo Max SoloStar 300 UNIT/ML Solostar Pen   magnesium hydroxide 400 MG/5ML suspension Commonly known as: MILK OF MAGNESIA Take 30 mLs by mouth daily as needed for mild constipation.   magnesium oxide 400 (240 Mg) MG tablet Commonly known as: MAG-OX Take 400 mg by mouth  daily.   metformin 1000 MG (OSM) 24 hr tablet Commonly known as: FORTAMET Take 1,000 mg by mouth daily with breakfast.   Microlet  Lancets Misc Test BS QID and as needed Dx E11.9   montelukast 10 MG tablet Commonly known as: SINGULAIR Take 1 tablet (10 mg total) by mouth at bedtime.   multivitamin with minerals tablet Take 1 tablet by mouth daily. What changed: Another medication with the same name was removed. Continue taking this medication, and follow the directions you see here.   pantoprazole 40 MG tablet Commonly known as: PROTONIX TAKE 1 TABLET BY MOUTH TWICE  DAILY BEFORE MEALS   ramelteon 8 MG tablet Commonly known as: Rozerem Take 1 tablet (8 mg total) by mouth at bedtime.   Testosterone 30 MG/ACT Soln Apply 1 pump under each axilla once daily.          Contact information for follow-up providers     Denyce Robert, NP Follow up.   Specialty: Cardiology Why: Tuesday Nov 14, 2023 Appt at 8:45 AM (45 min) Contact information: [address] [place] [address] [phone]              Contact information for after-discharge care     Destination     [facility] Preferred SNF .   Service: Skilled Nursing Contact information: [name] [place] [address] [phone]                     TOTAL DISCHARGE TIME: 35 mins  Scotty Weigelt Rito Ehrlich  [facility] Pager on [email or web address]  11/03/2023, 1:35 PM

## 2023-11-03 NOTE — Progress Notes (Signed)
 PROGRESS NOTE    Tony Cantu  ZOX:[id number] DOB: 08/14/1951 DOA: 10/24/2023 PCP: [name], DO    Brief Narrative:  Patient is a 73 y.o.  male with history of PAF, severe aortic stenosis, liver cirrhosis, DM-1, seizure disorder, OSA on CPAP-recent hospitalization from 1/21-1/09 for sepsis secondary to Streptococcus bacteremia-transferred to [facility] for evaluation of hypotension-patient was found to have AKI-possible GI bleeding-she was subsequently admitted to the ICU-have stabilized and transferred to [facility] services.  See below for further  Significant events: 2/25>>transfer from [facility] for hematemesis c/f GI bleed and hypotensive needing levophed 2/26>> started on precedex overnight due to agitation, GI signed off 2/27>> Afib RVR on EKG, started amio infusion, converted to sinus  2/28>> Afib RVR, gave additional amio bolus, converted to sinus 2/29>> off levophed 3/03>> transferred to [facility]   Significant studies: 2/25>> CXR: No PNA.   Significant microbiology data: 2/26>> blood: No growth   Procedures: None   Consults: PCCM GI  Subjective: Patient denies any complaints.  Waiting on rehab.    Assessment & Plan:   Shock. Hypotension felt to be due to hypovolemia in the setting of DKA Acute blood loss/hemorrhagic shock/sepsis all ruled out Briefly required pressors while in the ICU. BP now stable.   DKA Treated with IV fluids/IV insulin-has been transition to SQ insulin. Blood sugars are better now.    DM-1 (A1c 4.4 on 1/21) Hypoglycemic event intermittently Resumed Lantus 20 units, 8 units of Novolog w meals and SSI CBGs are better but not optimal.  Will increase glargine to 20 units twice a day.   PAF with RVR Rate controlled with amiodarone-now in sinus rhythm Initially on IV heparin infusion-but since no evidence of bleeding-switched to Eliquis on 3/3. Outpatient cardiology follow-up arranged for 3/18 Patient had developed rapid A-fib, is  currently on amiodarone loading.  Will have gradual tapering off amiodarone and remain on maintenance amiodarone as scheduled by pharmacy.    AKI Etiology likely hemodynamically mediated/osmotic mediated diuresis Creatinine has normalized with supportive care.   Severe aortic stenosis Known issue Outpatient cardiology follow-up arranged for 3/18   HTN BP-soft-not on any antihypertensives   HLD Statin   Hypophosphatemia Repleted   Hypomagnesemia Replete/recheck in 1 week.   Cirrhosis/portal hypertension Relatively stable/compensated Outpatient GI follow-up.   Thrombocytopenia Mild Follow periodically Could be due to acute illness or underlying cirrhosis.   Macrocytic anemia Likely due to acute illness superimposed on anemia of chronic disease. Monitor Hb-no evidence of acute bleeding  Recheck labs tomorrow if he is still here.   Recent history of streptococcal bacteremia Seems to have completed IV Rocephin-end of treatment was on 2/19.   History of upper GI AVMs No evidence of GI bleeding during this hospitalization-Hb stable Evaluated by GI and not felt to need any further endoscopy evaluation-has had EGD January 2025 (esophageal candidiasis/gastric AVMs/GAVE) and colonoscopy February 2024 with removal of 4 polyps and tubular adenoma/hyperplastic polyps)   Debility/deconditioning PT/OT-SNF planned   Nutrition Status: Nutrition Problem: Severe Malnutrition Etiology: chronic illness (cirrhosis) Signs/Symptoms: severe fat depletion, severe muscle depletion Interventions: Refer to RD note for recommendations   DVT prophylaxis: SCDs Start: 10/24/23 1735 apixaban (ELIQUIS) tablet 5 mg   Code Status: Full code Family Communication: None at the bedside Disposition Plan:  Waiting for SNF bed.      Objective: Vitals:   11/03/23 0200 11/03/23 0400 11/03/23 0711 11/03/23 0800  BP:  126/72  121/67  Pulse: 63 74  72  Resp: 14 19  20  Temp:  98.3 F (36.8 C)   97.9 F (36.6 C)  TempSrc:  Oral  Oral  SpO2: 94% 98%  99%  Weight:   85.2 kg   Height:        Intake/Output Summary (Last 24 hours) at 11/03/2023 1135 Last data filed at 11/03/2023 1009 Gross per 24 hour  Intake 717 ml  Output 1250 ml  Net -533 ml   Filed Weights   11/01/23 0421 11/02/23 0500 11/03/23 0711  Weight: 87.2 kg 85.5 kg 85.2 kg    Examination:  General appearance: Awake alert.  In no distress Resp: Clear to auscultation bilaterally.  Normal effort Cardio: S1-S2 is normal regular.  No S3-S4.  No rubs murmurs or bruit GI: Abdomen is soft.  Nontender nondistended.  Bowel sounds are present normal.  No masses organomegaly   Data Reviewed: I have personally reviewed following labs and imaging studies  CBC: Recent Labs  Lab 10/28/23 0436 10/29/23 0500 10/30/23 0328 11/01/23 0448  WBC 4.2 4.2 4.8 5.0  HGB 10.0* 10.0* 9.0* 7.6*  HCT 29.4* 29.7* 26.8* 22.9*  MCV 102.4* 103.5* 105.9* 108.0*  PLT 89* 96* 94* 90*   Basic Metabolic Panel: Recent Labs  Lab 10/28/23 0436 10/29/23 0500 10/30/23 0328 10/31/23 0220 11/01/23 0448  NA 134* 137 140 140 138  K 3.4* 3.3* 4.0 3.7 3.8  CL 101 105 108 109 106  CO2 27 25 25 23 24   GLUCOSE 192* 249* 153* 123* 189*  BUN 9 8 9  6* 10  CREATININE 0.78 0.83 0.81 0.70 0.72  CALCIUM 8.2* 8.4* 9.2 9.2 8.9  MG 1.7 1.6* 1.8 1.6* 1.8  PHOS 2.2* 2.5 1.8* 2.5  --    GFR: Estimated Creatinine Clearance: 87.7 mL/min (by C-G formula based on SCr of 0.72 mg/dL).  CBG: Recent Labs  Lab 11/02/23 0758 11/02/23 1122 11/02/23 1654 11/02/23 2111 11/03/23 1029  GLUCAP 266* 187* 293* 284* 359*     Recent Results (from the past 240 hours)  MRSA Next Gen by PCR, Nasal     Status: None   Collection Time: 10/24/23  5:09 PM   Specimen: Nasal Mucosa; Nasal Swab  Result Value Ref Range Status   MRSA by PCR Next Gen NOT DETECTED NOT DETECTED Final    Comment: (NOTE) The GeneXpert MRSA Assay (FDA approved for NASAL specimens only), is  one component of a comprehensive MRSA colonization surveillance program. It is not intended to diagnose MRSA infection nor to guide or monitor treatment for MRSA infections. Test performance is not FDA approved in patients less than 51 years old. Performed at [facility], [address], [place], [place] [address]   Culture, blood (Routine X 2) w Reflex to ID Panel     Status: None   Collection Time: 10/25/23  2:59 PM   Specimen: BLOOD RIGHT HAND  Result Value Ref Range Status   Specimen Description BLOOD RIGHT HAND  Final   Special Requests   Final    BOTTLES DRAWN AEROBIC ONLY Blood Culture results may not be optimal due to an inadequate volume of blood received in culture bottles   Culture   Final    NO GROWTH 5 DAYS Performed at [facility], [address] [place], [place] [address]    Report Status 10/30/2023 FINAL  Final  Culture, blood (Routine X 2) w Reflex to ID Panel     Status: None   Collection Time: 10/25/23  3:00 PM   Specimen:  BLOOD  Result Value Ref Range Status   Specimen Description BLOOD RIGHT ANTECUBITAL  Final   Special Requests   Final    BOTTLES DRAWN AEROBIC AND ANAEROBIC Blood Culture results may not be optimal due to an inadequate volume of blood received in culture bottles   Culture   Final    NO GROWTH 5 DAYS Performed at [facility], [address] [place], [place] [address]    Report Status 10/30/2023 FINAL  Final         Radiology Studies: No results found.  Scheduled Meds:  amiodarone  400 mg Oral Daily   Followed by   Melene Muller ON 11/08/2023] amiodarone  200 mg Oral Daily   apixaban  5 mg Oral BID   atorvastatin  40 mg Oral Daily   Chlorhexidine Gluconate Cloth  6 each Topical Daily   docusate sodium  100 mg Oral BID   feeding supplement (GLUCERNA SHAKE)  237 mL Oral TID BM   insulin aspart  0-20 Units Subcutaneous TID WC   insulin aspart  10 Units Subcutaneous TID WC   insulin glargine  18 Units Subcutaneous BID    montelukast  10 mg Oral QHS   multivitamin with minerals  1 tablet Oral Daily   pantoprazole  40 mg Oral BID   polyethylene glycol  17 g Oral Daily   sodium chloride flush  10-40 mL Intracatheter Q12H   sodium chloride flush  10-40 mL Intracatheter Q12H   Continuous Infusions:   LOS: 10 days     Osvaldo Shipper, MD [facility]

## 2023-11-03 NOTE — Progress Notes (Signed)
 AVS completed; printed and placed with patient chart.

## 2023-11-03 NOTE — Progress Notes (Signed)
 Occupational Therapy Treatment Patient Details Name: Tony Cantu MRN: [id number] DOB: 05/12/1951 Today's Date: 11/03/2023   History of present illness 73 y.o. male admitted by transfer via EMS 2/25 from [facility] from [facility] ED for syncope. Dx: Shock, likely hypovolemic and DKA. PMHx:  type 1 diabetes, A-fib on Eliquis, cirrhosis, seizure disorder, aortic stenosis, hypertension, peptic ulcer disease, OA with bilateral knee replacement, polyclonal gammopathy, OSA on CPAP.   OT comments  Pt is making continued progress towards acute OT goals. Pt continues to be limited by generalized weakness, decreased activity tolerance, and positive orthostatics. Pt with improvement in cognition on this date, following verbal commands consistently. Overall, pt required up to MOD A for functional mobility tasks. Pt with reports of increased dizziness once seated upright. Pt BP monitored and with positive orthostatics. OT to continue following pt acutely to address functional needs with discharge recommendations of follow-up OT services <3hrs/day to maximize functional independence.   77/49 (58) seated EOB *symptomatic* 104/61 (75) supine in bed 99/62 (73) seated upright in bed 81/45 (58) seated EOB *symptomatic* 101/56 (70) supine in bed      If plan is discharge home, recommend the following:  Two people to help with walking and/or transfers;A lot of help with bathing/dressing/bathroom;Assistance with cooking/housework;Direct supervision/assist for medications management;Direct supervision/assist for financial management;Assist for transportation;Help with stairs or ramp for entrance;Supervision due to cognitive status   Equipment Recommendations  Other (comment)    Recommendations for Other Services      Precautions / Restrictions Precautions Precautions: Fall Recall of Precautions/Restrictions: Impaired Precaution/Restrictions Comments: monitor BP Restrictions Weight Bearing  Restrictions Per Provider Order: No       Mobility Bed Mobility Overal bed mobility: Needs Assistance Bed Mobility: Supine to Sit, Sit to Supine     Supine to sit: Mod assist, Used rails, HOB elevated Sit to supine: Mod assist, Used rails   General bed mobility comments: Pt reported increased pain in RLE with movement. Pt able to complete bed mobility with increased time and verbal cues for sequencing. Pt provided with assistance elevating trunk to upright position and to scoot forward until B feet were flat on the floor    Transfers Overall transfer level: Needs assistance                 General transfer comment: DNT due to positive orthostatics     Balance Overall balance assessment: Needs assistance Sitting-balance support: Feet supported, Bilateral upper extremity supported Sitting balance-Leahy Scale: Fair Sitting balance - Comments: static sitting EOB                                   ADL either performed or assessed with clinical judgement   ADL Overall ADL's : Needs assistance/impaired                                     Functional mobility during ADLs: Moderate assistance;Cueing for sequencing General ADL Comments: Pt limited by positive orthostatics on this date    Extremity/Trunk Assessment Upper Extremity Assessment Upper Extremity Assessment: Overall WFL for tasks assessed   Lower Extremity Assessment Lower Extremity Assessment: Defer to PT evaluation        Vision   Vision Assessment?: No apparent visual deficits   Perception Perception Perception: Not tested   Praxis Praxis Praxis: Not tested  Communication Communication Communication: No apparent difficulties   Cognition Arousal: Alert Behavior During Therapy: WFL for tasks assessed/performed Cognition: Cognition impaired, No family/caregiver present to determine baseline             OT - Cognition Comments: Improvement in cognition noted. Pt  able to follow verbal commands and expressed wants/needs                 Following commands: Intact        Cueing   Cueing Techniques: Verbal cues, Tactile cues  Exercises      Shoulder Instructions       General Comments Pt with positive orthostatics; VSS on RA at the end of session, BP was 101/56 (70)    Pertinent Vitals/ Pain       Pain Assessment Pain Assessment: Faces Faces Pain Scale: Hurts little more Pain Location: right thigh Pain Descriptors / Indicators: Discomfort Pain Intervention(s): Monitored during session  Home Living                                          Prior Functioning/Environment              Frequency  Min 1X/week        Progress Toward Goals  OT Goals(current goals can now be found in the care plan section)  Progress towards OT goals: Progressing toward goals  Acute Rehab OT Goals Patient Stated Goal: to get better OT Goal Formulation: With patient Time For Goal Achievement: 11/13/23 Potential to Achieve Goals: Good ADL Goals Pt Will Perform Grooming: with supervision;sitting Pt Will Perform Upper Body Dressing: with supervision;sitting Pt Will Perform Lower Body Dressing: with mod assist;sitting/lateral leans;sit to/from stand Pt Will Transfer to Toilet: with mod assist;stand pivot transfer;bedside commode Pt Will Perform Toileting - Clothing Manipulation and hygiene: with mod assist;sitting/lateral leans;sit to/from stand Additional ADL Goal #1: Pt will complete bed mobility with CGA in preparation for OOB functional tasks  Plan      Co-evaluation                 AM-PAC OT "6 Clicks" Daily Activity     Outcome Measure   Help from another person eating meals?: None Help from another person taking care of personal grooming?: A Little Help from another person toileting, which includes using toliet, bedpan, or urinal?: A Lot Help from another person bathing (including washing, rinsing,  drying)?: A Lot Help from another person to put on and taking off regular upper body clothing?: A Little Help from another person to put on and taking off regular lower body clothing?: A Lot 6 Click Score: 16    End of Session    OT Visit Diagnosis: Unsteadiness on feet (R26.81);Other abnormalities of gait and mobility (R26.89);Muscle weakness (generalized) (M62.81)   Activity Tolerance Other (comment) (limited by positive orthostatics)   Patient Left in bed;with nursing/sitter in room   Nurse Communication Mobility status (BP)        Time: 8295-6213 OT Time Calculation (min): 20 min  Charges: OT General Charges $OT Visit: 1 Visit OT Treatments $Therapeutic Activity: 8-22 mins  [address], MOTS  Maizy Davanzo 11/03/2023, 1:45 PM

## 2023-11-03 NOTE — Plan of Care (Signed)
  Problem: Health Behavior/Discharge Planning: Goal: Ability to manage health-related needs will improve Outcome: Progressing   Problem: Clinical Measurements: Goal: Respiratory complications will improve Outcome: Progressing   Problem: Activity: Goal: Risk for activity intolerance will decrease Outcome: Progressing   Problem: Nutrition: Goal: Adequate nutrition will be maintained Outcome: Progressing   Problem: Coping: Goal: Level of anxiety will decrease Outcome: Progressing   Problem: Pain Managment: Goal: General experience of comfort will improve and/or be controlled Outcome: Progressing   Problem: Safety: Goal: Ability to remain free from injury will improve Outcome: Progressing   Problem: Skin Integrity: Goal: Risk for impaired skin integrity will decrease Outcome: Progressing   Problem: Coping: Goal: Ability to adjust to condition or change in health will improve Outcome: Progressing   Problem: Metabolic: Goal: Ability to maintain appropriate glucose levels will improve Outcome: Progressing   Problem: Skin Integrity: Goal: Risk for impaired skin integrity will decrease Outcome: Progressing

## 2023-11-03 NOTE — Inpatient Diabetes Management (Signed)
 Inpatient Diabetes Program Recommendations  AACE/ADA: New Consensus Statement on Inpatient Glycemic Control (2015)  Target Ranges:  Prepandial:   less than 140 mg/dL      Peak postprandial:   less than 180 mg/dL (1-2 hours)      Critically ill patients:  140 - 180 mg/dL   Lab Results  Component Value Date   GLUCAP 284 (H) 11/02/2023   HGBA1C 4.4 (L) 09/19/2023    Review of Glycemic Control   Latest Reference Range & Units 11/02/23 07:58 11/02/23 11:22 11/02/23 16:54 11/02/23 21:11  Glucose-Capillary 70 - 99 mg/dL 782 (H) 956 (H) 213 (H) 284 (H)   Diabetes history: DM 2 Outpatient Diabetes medications: Novolog 2-15 units 4x/day, Novolog 8 units tid meal coverage, Toujeo 20 units Daily, metformin 1000 mg Daily Current orders for Inpatient glycemic control:  Lantus 18 units bid Novolog 0-20 units tid  Novolog 10 units tid meal coverage  Glucerna tid between meals (20 grams of carbohydrate)  Inpatient Diabetes Program Recommendations:    -   Increase Novolog meal coverage to 12 units tid if eating >50% of meals/Supplements  Thanks,  Christena Deem RN, MSN, BC-ADM Inpatient Diabetes Coordinator Team Pager [phone] (8a-5p)

## 2023-11-06 DIAGNOSIS — R531 Weakness: Secondary | ICD-10-CM | POA: Diagnosis not present

## 2023-11-06 DIAGNOSIS — I4891 Unspecified atrial fibrillation: Secondary | ICD-10-CM | POA: Diagnosis not present

## 2023-11-06 DIAGNOSIS — E1069 Type 1 diabetes mellitus with other specified complication: Secondary | ICD-10-CM | POA: Diagnosis not present

## 2023-11-06 DIAGNOSIS — D638 Anemia in other chronic diseases classified elsewhere: Secondary | ICD-10-CM | POA: Diagnosis not present

## 2023-11-07 DIAGNOSIS — K7460 Unspecified cirrhosis of liver: Secondary | ICD-10-CM | POA: Diagnosis not present

## 2023-11-07 DIAGNOSIS — D638 Anemia in other chronic diseases classified elsewhere: Secondary | ICD-10-CM | POA: Diagnosis not present

## 2023-11-07 DIAGNOSIS — R531 Weakness: Secondary | ICD-10-CM | POA: Diagnosis not present

## 2023-11-07 DIAGNOSIS — E1069 Type 1 diabetes mellitus with other specified complication: Secondary | ICD-10-CM | POA: Diagnosis not present

## 2023-11-08 ENCOUNTER — Other Ambulatory Visit: Payer: Self-pay | Admitting: *Deleted

## 2023-11-08 DIAGNOSIS — R110 Nausea: Secondary | ICD-10-CM | POA: Diagnosis not present

## 2023-11-08 DIAGNOSIS — E1069 Type 1 diabetes mellitus with other specified complication: Secondary | ICD-10-CM | POA: Diagnosis not present

## 2023-11-08 DIAGNOSIS — R195 Other fecal abnormalities: Secondary | ICD-10-CM | POA: Diagnosis not present

## 2023-11-08 DIAGNOSIS — R638 Other symptoms and signs concerning food and fluid intake: Secondary | ICD-10-CM | POA: Diagnosis not present

## 2023-11-08 DIAGNOSIS — R531 Weakness: Secondary | ICD-10-CM | POA: Diagnosis not present

## 2023-11-08 NOTE — Patient Outreach (Signed)
 Mr. Tony Cantu resides in [facility].  Screening for potential complex care management services as benefit of health plan and primary care provider.  Secure communication to [facility] discharge planning team and therapy manager to make aware writer is following for potential CCM needs.   Raiford Noble, MSN, RN, BSN [place]  [facility], [employer] [facility] RN Post- Acute Care Manager Direct Dial: [phone]

## 2023-11-09 DIAGNOSIS — R531 Weakness: Secondary | ICD-10-CM | POA: Diagnosis not present

## 2023-11-09 DIAGNOSIS — R42 Dizziness and giddiness: Secondary | ICD-10-CM | POA: Diagnosis not present

## 2023-11-09 DIAGNOSIS — R638 Other symptoms and signs concerning food and fluid intake: Secondary | ICD-10-CM | POA: Diagnosis not present

## 2023-11-10 DIAGNOSIS — R638 Other symptoms and signs concerning food and fluid intake: Secondary | ICD-10-CM | POA: Diagnosis not present

## 2023-11-10 DIAGNOSIS — E1069 Type 1 diabetes mellitus with other specified complication: Secondary | ICD-10-CM | POA: Diagnosis not present

## 2023-11-10 DIAGNOSIS — R531 Weakness: Secondary | ICD-10-CM | POA: Diagnosis not present

## 2023-11-13 NOTE — Progress Notes (Unsigned)
 Cardiology Office Note:    Date:  11/14/2023  ID:  Tony Cantu, DOB [birth year], MRN [id number] PCP: [name], DO  [facility] Providers Cardiologist:  Rollene Rotunda, MD       Patient Profile:      Chief Complaint: Hospital follow-up for atrial fibrillation and aortic stenosis  History of Present Illness:  Tony Cantu is a 73 y.o. male with visit-pertinent history of paroxysmal atrial fibrillation, obstructive sleep apnea, aortic stenosis, hypertension, hyperlipidemia, T2DM, gastric ulcers, PUD, cirrhosis with gastric varices  He was last seen in clinic on 05/11/2022.  He was doing well at the time and no medication changes were made.  He was to follow-up in 1 year.  He presented to ED on 09/19/2023 for evaluation of worsening weakness over the past 3 days.  Upon arrival he was septic as well as febrile, tachycardic, and tachypneic.  High sensitive troponin values were mildly elevated at 28 and 35.  CT head and MRI brain showed no acute infarct.  CT chest showed liver cirrhosis, diverticular disease and perinephric stranding.  GI was consulted on admission given worsening anemia and melena.  Blood cultures resulted positive for strep bacteremia and ID was consulted.  Echocardiogram showed preserved EF of 55-60% with no regional Mantia motion abnormalities.  Was noted to have grade 1 diastolic dysfunction, normal RV function, trivial MR with severe calcification of the aortic valve with trivial AI but severe aortic stenosis with mean gradient 46.3 mmHg.  Cardiology was consulted on 09/22/2023 for the evaluation of severe AS as well as cardiac clearance for EGD and need for TEE in setting of bacteremia.  TEE was completed on 09/25/2023 showing no evidence of endocarditis, normal LV function, moderate left atrial enlargement, mild mitral regurgitation, moderate aortic stenosis with mean gradient 28 mmHg, trace aortic insufficiency.  EGD showed esophageal plaques consistent with  candidiasis, gastric antral vascular ectasia, portal hypertensive gastropathy.  He was recently admitted from 2/25 through 11/03/2023 hypovolemic shock.  Acute blood loss/hemorrhagic shock/sepsis shock all ruled out.  He was brought in by EMS from [facility] rehab to the [facility] ED on 2/24 for syncope.  The syncopal episode was not witnessed.  He was found in a wheelchair and was unresponsive for 10 to 15 minutes.  Initial blood pressure in the ED was 67/41 and bradycardic at 55.  He was started on a Levophed drip.  Hypotension was felt to be due to hypovolemia in the setting of DKA.  He was treated with IV fluids/IV insulin.  On 10/26/2023 had episode of A-fib with RVR and started on amiodarone and converted to sinus.  Again on 10/27/2023 he had another episode of A-fib RVR, gave additional amiodarone bolus, converted to sinus.  He was discharged home on amiodarone 200 mg daily.   Discussed the use of AI scribe software for clinical note transcription with the patient, who gave verbal consent to proceed.  Patient arrives into clinic today by himself.  He is currently living at [facility].  He does plan to move into [facility] starting tomorrow.  Today he is without any acute concerns or complaints.  He is very inactive and currently wheelchair-bound.  He is without any chest pains, dyspnea, syncope, presyncope.  He denies any orthopnea, PND, leg swelling.  He however does note dizziness that has been a daily occurrence and began approximately 6 months ago.  He reports that his blood pressure is usually low, typically around 106/50  at his facility. He hopes to become more active over the next month while living even rehab facility.  He plans to be there for 21 days and then move in with his ex-wife.    Review of systems:  Please see the history of present illness. All other systems are reviewed and otherwise negative.     Home Medications:    Current Meds  Medication Sig    acetaminophen (TYLENOL) 500 MG tablet Take 1,000 mg by mouth every 6 (six) hours as needed for moderate pain.   amiodarone (PACERONE) 200 MG tablet 400 mg  po daily from 3/7 to 3/11 200 mg  po daily from 3/12   apixaban (ELIQUIS) 5 MG TABS tablet Take 1 tablet (5 mg total) by mouth 2 (two) times daily.   atorvastatin (LIPITOR) 40 MG tablet Take 1 tablet (40 mg total) by mouth daily.   b complex vitamins tablet Take 1 tablet by mouth daily.   Blood Glucose Monitoring Suppl (CONTOUR NEXT ONE) KIT Test BS QID and as needed Dx E11.9   Cholecalciferol (DIALYVITE VITAMIN D 5000) 125 MCG (5000 UT) capsule Take 5,000 Units by mouth daily.   cholecalciferol (VITAMIN D3) 25 MCG (1000 UNIT) tablet Take 5,000 Units by mouth daily.   Continuous Blood Gluc Sensor (FREESTYLE LIBRE SENSOR SYSTEM) MISC Check BS eight (8) times a day. Dx E10.9   dextromethorphan-guaiFENesin (ROBITUSSIN-DM) 10-100 MG/5ML liquid Take 10 mLs by mouth in the morning and at bedtime.   ferrous sulfate 325 (65 FE) MG EC tablet Take 1 tablet (325 mg total) by mouth daily with breakfast.   Glucagon, rDNA, (GLUCAGON EMERGENCY) 1 MG KIT INJECT AS DIRECTED INTO  UPPER ARM, THIGH OR  BUTTOCKS AS NEEDED FOR  SEVERE HYPOGLYCEMIA. SEEK  MEDICAL ATTENTION AFTER USE   glucose blood (CONTOUR NEXT TEST) test strip Test BS QID and as needed Dx E11.9   insulin aspart (NOVOLOG) 100 UNIT/ML injection Inject 2-15 Units into the skin 4 (four) times daily -  before meals and at bedtime. CBG 121 - 150: 2 units, CBG 151 - 200: 3 units, CBG 201 - 250: 5 units, CBG 251 - 300: 8 units, CBG 301 - 350: 11 units, CBG 351 - 400: 15 units, CBG > 400call MD   insulin aspart (NOVOLOG) 100 UNIT/ML injection Inject 8 Units into the skin 3 (three) times daily before meals.   insulin glargine (LANTUS) 100 UNIT/ML injection Inject 0.2 mLs (20 Units total) into the skin 2 (two) times daily.   magnesium hydroxide (MILK OF MAGNESIA) 400 MG/5ML suspension Take 30 mLs by mouth  daily as needed for mild constipation.   magnesium oxide (MAG-OX) 400 (240 Mg) MG tablet Take 400 mg by mouth daily.   metformin (FORTAMET) 1000 MG (OSM) 24 hr tablet Take 1,000 mg by mouth daily with breakfast.   Microlet Lancets MISC Test BS QID and as needed Dx E11.9   montelukast (SINGULAIR) 10 MG tablet Take 1 tablet (10 mg total) by mouth at bedtime.   Multiple Vitamins-Minerals (MULTIVITAMIN WITH MINERALS) tablet Take 1 tablet by mouth daily.   pantoprazole (PROTONIX) 40 MG tablet TAKE 1 TABLET BY MOUTH TWICE  DAILY BEFORE MEALS   ramelteon (ROZEREM) 8 MG tablet Take 1 tablet (8 mg total) by mouth at bedtime.   Testosterone 30 MG/ACT SOLN Apply 1 pump under each axilla once daily.   Studies Reviewed:   EKG Interpretation Date/Time:  Tuesday November 14 2023 09:20:50 EDT Ventricular Rate:  72 PR Interval:  164 QRS Duration:  136 QT Interval:  400 QTC Calculation: 438 R Axis:   204  Text Interpretation: Normal sinus rhythm Right bundle branch block Confirmed by Rise Paganini [id number] on 11/14/2023 1:08:24 PM    TEE 09/25/2023 1. Left ventricular ejection fraction, by estimation, is 60 to 65%. The  left ventricle has normal function.   2. Right ventricular systolic function is normal. The right ventricular  size is normal.   3. Left atrial size was moderately dilated. No left atrial/left atrial  appendage thrombus was detected.   4. The mitral valve is normal in structure. Mild mitral valve  regurgitation.   5. The aortic valve is tricuspid. There is moderate calcification of the  aortic valve. Aortic valve regurgitation is trivial. Moderate aortic valve  stenosis. Aortic valve mean gradient measures 28.0 mmHg. Aortic valve Vmax  measures 3.34 m/s.   6. There is mild (Grade II) layered plaque involving the descending  aorta.   Echocardiogram 09/20/2023 1. The aortic valve is calcified. Possibly tricuspid. There is severe  calcifcation of the aortic valve. There is severe  thickening of the aortic  valve. Aortic valve regurgitation is trivial. Severe aortic valve  stenosis. Aortic valve area, by VTI  measures 0.89 cm. Aortic valve mean gradient measures 46.3 mmHg. Aortic  valve Vmax measures 4.55 m/s. DVI is 0.27.   2. Left ventricular ejection fraction, by estimation, is 55 to 60%. The  left ventricle has normal function. Left ventricular endocardial border  not optimally defined to evaluate regional Borquez motion. The left  ventricular internal cavity size was mildly  to moderately dilated. Left ventricular diastolic parameters are  consistent with Grade I diastolic dysfunction (impaired relaxation).   3. Right ventricular systolic function is normal. The right ventricular  size is normal. Tricuspid regurgitation signal is inadequate for assessing  PA pressure.   4. The mitral valve is abnormal. Trivial mitral valve regurgitation. Mild  mitral stenosis. The mean mitral valve gradient is 3.0 mmHg. Moderate  mitral annular calcification.   5. The inferior vena cava is normal in size with greater than 50%  respiratory variability, suggesting right atrial pressure of 3 mmHg.  Risk Assessment/Calculations:    CHA2DS2-VASc Score = 4   This indicates a 4.8% annual risk of stroke. The patient's score is based upon: CHF History: 0 HTN History: 1 Diabetes History: 1 Stroke History: 0 Vascular Disease History: 1 Age Score: 1 Gender Score: 0            Physical Exam:   VS:  BP 93/61   Pulse 75   Ht 5\' 8"  (1.727 m)   Wt 217 lb (98.4 kg)   SpO2 98%   BMI 32.99 kg/m    Wt Readings from Last 3 Encounters:  11/14/23 217 lb (98.4 kg)  11/03/23 187 lb 13.3 oz (85.2 kg)  09/20/23 203 lb 11.3 oz (92.4 kg)    GEN: Well nourished, well developed in no acute distress NECK: No JVD; No carotid bruits CARDIAC: RRR, 3/6 systolic murmur.  No rubs, gallops RESPIRATORY:  Clear to auscultation without rales, wheezing or rhonchi  ABDOMEN: Soft, non-tender,  non-distended EXTREMITIES:  No edema; No acute deformity     Assessment and Plan:  Aortic stenosis Echocardiogram 09/20/2023 noted severe aortic stenosis with mean gradient 46.3 mmHg TEE 09/25/2023 showed moderate calcification of the aortic valve, trivial AR, and moderate aortic valve stenosis with mean gradient 28 mmHg -He is wheelchair-bound and not active.  He does deny any chest  pains, exertional angina, SOB/DOE, syncope/presyncope. -He may need TAVR in future however with him currently being asymptomatic at this time and moderate AS on last TEE I do not feel he meets criteria for TAVR at this time -Can plan for repeat echocardiogram in 08/2024  Paroxysmal atrial fibrillation  He developed rapid A-fib during previous admission for hypovolemic shock in the setting of DKA.  He had 2 episodes of A-fib with RVR on 2/27 and 2/28 was started on IV amiodarone infusion and given additional amiodarone bolus and eventually converted to sinus.  He was discharged home on amiodarone loading -His EKG today shows normal sinus rhythm with RBBB -He has been without any complaints of irregular heart rhythms or tachycardia since his discharge.  He seems to be maintaining NSR on amiodarone. -Will continue Amiodarone 200 mg daily -Continue Eliquis 5 mg twice daily, no bleeding concerns noted -CMET and CBC today   Recent history of streptococcal bacteremia Has completed IV treatment, end of treatment was on 10/18/2023.  T1DM -Managed by PCP  Obstructive sleep apnea -Adherent to CPAP  Cirrhosis / Portal hypertension He is compensated today -Managed by GI  History of upper GI AVMs No evidence of GI bleeding during hospitalization.  His hemoglobin was stable. Evaluated by GI and not felt to need any further endoscopy evaluation. Has had EGD January 2025 and colonoscopy February 2024  -Managed by GI            Dispo:  Follow-up with Dr. Antoine Poche in [place] in 2-3 months.   Signed, Denyce Robert, NP

## 2023-11-14 ENCOUNTER — Encounter: Payer: Self-pay | Admitting: Emergency Medicine

## 2023-11-14 ENCOUNTER — Ambulatory Visit: Attending: Emergency Medicine | Admitting: Emergency Medicine

## 2023-11-14 ENCOUNTER — Inpatient Hospital Stay: Payer: BC Managed Care – PPO | Attending: Physician Assistant

## 2023-11-14 VITALS — BP 93/61 | HR 75 | Ht 68.0 in | Wt 217.0 lb

## 2023-11-14 DIAGNOSIS — I350 Nonrheumatic aortic (valve) stenosis: Secondary | ICD-10-CM | POA: Diagnosis not present

## 2023-11-14 DIAGNOSIS — I152 Hypertension secondary to endocrine disorders: Secondary | ICD-10-CM | POA: Diagnosis not present

## 2023-11-14 DIAGNOSIS — G4733 Obstructive sleep apnea (adult) (pediatric): Secondary | ICD-10-CM | POA: Diagnosis not present

## 2023-11-14 DIAGNOSIS — E1159 Type 2 diabetes mellitus with other circulatory complications: Secondary | ICD-10-CM | POA: Diagnosis not present

## 2023-11-14 DIAGNOSIS — K7469 Other cirrhosis of liver: Secondary | ICD-10-CM | POA: Diagnosis not present

## 2023-11-14 DIAGNOSIS — I480 Paroxysmal atrial fibrillation: Secondary | ICD-10-CM

## 2023-11-14 DIAGNOSIS — R072 Precordial pain: Secondary | ICD-10-CM | POA: Diagnosis not present

## 2023-11-14 LAB — CBC

## 2023-11-14 NOTE — Patient Instructions (Signed)
 Medication Instructions:  NO CHANGES   Lab Work: CBC AND CMET TO BE DONE TODAY.    Testing/Procedures: NONE   Follow-Up: At [facility], you and your health needs are our priority.  As part of our continuing mission to provide you with exceptional heart care, we have created designated Provider Care Teams.  These Care Teams include your primary Cardiologist (physician) and Advanced Practice Providers (APPs -  Physician Assistants and Nurse Practitioners) who all work together to provide you with the care you need, when you need it.   Your next appointment:   1 month(s)  Provider:   Rollene Rotunda, MD    Other Instructions:

## 2023-11-15 ENCOUNTER — Other Ambulatory Visit: Payer: Self-pay | Admitting: *Deleted

## 2023-11-15 DIAGNOSIS — K922 Gastrointestinal hemorrhage, unspecified: Secondary | ICD-10-CM | POA: Diagnosis not present

## 2023-11-15 DIAGNOSIS — I4891 Unspecified atrial fibrillation: Secondary | ICD-10-CM | POA: Diagnosis not present

## 2023-11-15 DIAGNOSIS — E1110 Type 2 diabetes mellitus with ketoacidosis without coma: Secondary | ICD-10-CM | POA: Diagnosis not present

## 2023-11-15 DIAGNOSIS — E1069 Type 1 diabetes mellitus with other specified complication: Secondary | ICD-10-CM | POA: Diagnosis not present

## 2023-11-15 LAB — CBC
Hematocrit: 37.8 % (ref 37.5–51.0)
Hemoglobin: 12.4 g/dL — ABNORMAL LOW (ref 13.0–17.7)
MCH: 35.4 pg — ABNORMAL HIGH (ref 26.6–33.0)
MCHC: 32.8 g/dL (ref 31.5–35.7)
MCV: 108 fL — ABNORMAL HIGH (ref 79–97)
Platelets: 189 10*3/uL (ref 150–450)
RBC: 3.5 x10E6/uL — ABNORMAL LOW (ref 4.14–5.80)
RDW: 15.2 % (ref 11.6–15.4)
WBC: 4.4 10*3/uL (ref 3.4–10.8)

## 2023-11-15 LAB — COMPREHENSIVE METABOLIC PANEL
ALT: 57 IU/L — ABNORMAL HIGH (ref 0–44)
AST: 61 IU/L — ABNORMAL HIGH (ref 0–40)
Albumin: 3.3 g/dL — ABNORMAL LOW (ref 3.8–4.8)
Alkaline Phosphatase: 216 IU/L — ABNORMAL HIGH (ref 44–121)
BUN/Creatinine Ratio: 10 (ref 10–24)
BUN: 8 mg/dL (ref 8–27)
Bilirubin Total: 1.2 mg/dL (ref 0.0–1.2)
CO2: 21 mmol/L (ref 20–29)
Calcium: 9.8 mg/dL (ref 8.6–10.2)
Chloride: 102 mmol/L (ref 96–106)
Creatinine, Ser: 0.82 mg/dL (ref 0.76–1.27)
Globulin, Total: 3.3 g/dL (ref 1.5–4.5)
Glucose: 228 mg/dL — ABNORMAL HIGH (ref 70–99)
Potassium: 3.5 mmol/L (ref 3.5–5.2)
Sodium: 142 mmol/L (ref 134–144)
Total Protein: 6.6 g/dL (ref 6.0–8.5)
eGFR: 92 mL/min/{1.73_m2} (ref >59)

## 2023-11-15 NOTE — Patient Outreach (Signed)
 Post-Acute Care Manager follow up. Mr. Tony Cantu resides in [facility]. Screening for potential complex care management services as benefit of health plan and primary care provider.  Collaboration meeting with [facility]. Mr. Tony Cantu currently requires max assist with therapy. Anticipated transition plan is to return home with ex-wife Tony Cantu when requiring less care.   Will continue to follow for potential complex care management needs.   Tony Noble, MSN, RN, BSN [place]  [facility], [facility] RN Post- Acute Care Manager Direct Dial: [phone]

## 2023-11-16 DIAGNOSIS — F419 Anxiety disorder, unspecified: Secondary | ICD-10-CM | POA: Diagnosis not present

## 2023-11-16 DIAGNOSIS — R531 Weakness: Secondary | ICD-10-CM | POA: Diagnosis not present

## 2023-11-16 NOTE — Progress Notes (Signed)
 Left VM for patient to call back.

## 2023-11-18 DIAGNOSIS — R531 Weakness: Secondary | ICD-10-CM | POA: Diagnosis not present

## 2023-11-18 DIAGNOSIS — F419 Anxiety disorder, unspecified: Secondary | ICD-10-CM | POA: Diagnosis not present

## 2023-11-18 DIAGNOSIS — R52 Pain, unspecified: Secondary | ICD-10-CM | POA: Diagnosis not present

## 2023-11-20 ENCOUNTER — Telehealth: Payer: Self-pay

## 2023-11-20 NOTE — Progress Notes (Deleted)
 Tony Cantu

## 2023-11-20 NOTE — Progress Notes (Signed)
 Care Guide Pharmacy Note  11/20/2023 Name: Tony Cantu MRN: [id number] DOB: 08-Aug-1951  Referred By: [name], DO Reason for referral: Care Coordination (Outreach to reschedule with pharm d )   Tony Cantu is a 73 y.o. year old male who is a primary care patient of [name], DO.  Tony Cantu was referred to the pharmacist for assistance related to: DMII  An unsuccessful telephone outreach was attempted today to contact the patient who was referred to the pharmacy team for assistance with medication management. Additional attempts will be made to contact the patient.  Penne Lash , RMA     [facility]  [facility], [facility] Guide  Direct Dial: [phone]  Website: [email or web address]

## 2023-11-21 ENCOUNTER — Inpatient Hospital Stay: Payer: BC Managed Care – PPO | Admitting: Physician Assistant

## 2023-11-21 DIAGNOSIS — R52 Pain, unspecified: Secondary | ICD-10-CM | POA: Diagnosis not present

## 2023-11-21 DIAGNOSIS — R531 Weakness: Secondary | ICD-10-CM | POA: Diagnosis not present

## 2023-11-21 DIAGNOSIS — R5381 Other malaise: Secondary | ICD-10-CM | POA: Diagnosis not present

## 2023-11-21 DIAGNOSIS — R5383 Other fatigue: Secondary | ICD-10-CM | POA: Diagnosis not present

## 2023-11-22 DIAGNOSIS — R5383 Other fatigue: Secondary | ICD-10-CM | POA: Diagnosis not present

## 2023-11-22 DIAGNOSIS — R5381 Other malaise: Secondary | ICD-10-CM | POA: Diagnosis not present

## 2023-11-22 DIAGNOSIS — R531 Weakness: Secondary | ICD-10-CM | POA: Diagnosis not present

## 2023-11-23 DIAGNOSIS — F331 Major depressive disorder, recurrent, moderate: Secondary | ICD-10-CM | POA: Diagnosis not present

## 2023-11-23 DIAGNOSIS — R531 Weakness: Secondary | ICD-10-CM | POA: Diagnosis not present

## 2023-11-23 DIAGNOSIS — R7989 Other specified abnormal findings of blood chemistry: Secondary | ICD-10-CM | POA: Diagnosis not present

## 2023-11-23 DIAGNOSIS — F411 Generalized anxiety disorder: Secondary | ICD-10-CM | POA: Diagnosis not present

## 2023-11-23 DIAGNOSIS — E876 Hypokalemia: Secondary | ICD-10-CM | POA: Diagnosis not present

## 2023-11-23 DIAGNOSIS — D649 Anemia, unspecified: Secondary | ICD-10-CM | POA: Diagnosis not present

## 2023-11-23 DIAGNOSIS — F5105 Insomnia due to other mental disorder: Secondary | ICD-10-CM | POA: Diagnosis not present

## 2023-11-23 DIAGNOSIS — G3184 Mild cognitive impairment, so stated: Secondary | ICD-10-CM | POA: Diagnosis not present

## 2023-11-24 ENCOUNTER — Telehealth: Payer: Self-pay | Admitting: Nurse Practitioner

## 2023-11-24 NOTE — Progress Notes (Signed)
 Complex Care Management Care Guide Note  11/24/2023 Name: Tony Cantu MRN: [id number] DOB: April 09, 1951  Tony Cantu is a 73 y.o. year old male who is a primary care patient of [name], DO and is actively engaged with the care management team. I reached out to [name] by phone today to assist with re-scheduling  with the Pharmacist.  Follow up plan: Unsuccessful telephone outreach attempt made. A HIPAA compliant phone message was left for the patient providing contact information and requesting a return call.  Penne Lash , RMA     [facility]  [facility], [facility] Guide  Direct Dial: [phone]  Website: [email or web address]

## 2023-11-26 DIAGNOSIS — R531 Weakness: Secondary | ICD-10-CM | POA: Diagnosis not present

## 2023-11-26 DIAGNOSIS — Z7409 Other reduced mobility: Secondary | ICD-10-CM | POA: Diagnosis not present

## 2023-11-26 DIAGNOSIS — N390 Urinary tract infection, site not specified: Secondary | ICD-10-CM | POA: Diagnosis not present

## 2023-11-26 DIAGNOSIS — E876 Hypokalemia: Secondary | ICD-10-CM | POA: Diagnosis not present

## 2023-11-29 ENCOUNTER — Encounter: Payer: Self-pay | Admitting: Family Medicine

## 2023-11-29 ENCOUNTER — Telehealth: Payer: Self-pay | Admitting: Family Medicine

## 2023-11-29 ENCOUNTER — Encounter: Payer: Medicare Other | Admitting: Family Medicine

## 2023-11-29 DIAGNOSIS — Z7189 Other specified counseling: Secondary | ICD-10-CM | POA: Diagnosis not present

## 2023-11-29 DIAGNOSIS — E559 Vitamin D deficiency, unspecified: Secondary | ICD-10-CM | POA: Diagnosis not present

## 2023-11-29 DIAGNOSIS — R531 Weakness: Secondary | ICD-10-CM | POA: Diagnosis not present

## 2023-11-29 DIAGNOSIS — N390 Urinary tract infection, site not specified: Secondary | ICD-10-CM | POA: Diagnosis not present

## 2023-12-01 DIAGNOSIS — E1069 Type 1 diabetes mellitus with other specified complication: Secondary | ICD-10-CM | POA: Diagnosis not present

## 2023-12-01 DIAGNOSIS — D649 Anemia, unspecified: Secondary | ICD-10-CM | POA: Diagnosis not present

## 2023-12-01 DIAGNOSIS — R531 Weakness: Secondary | ICD-10-CM | POA: Diagnosis not present

## 2023-12-01 DIAGNOSIS — R790 Abnormal level of blood mineral: Secondary | ICD-10-CM | POA: Diagnosis not present
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
## 2023-12-02 DIAGNOSIS — N182 Chronic kidney disease, stage 2 (mild): Secondary | ICD-10-CM | POA: Diagnosis not present

## 2023-12-02 DIAGNOSIS — Z794 Long term (current) use of insulin: Secondary | ICD-10-CM | POA: Diagnosis not present

## 2023-12-02 DIAGNOSIS — E1122 Type 2 diabetes mellitus with diabetic chronic kidney disease: Secondary | ICD-10-CM | POA: Diagnosis not present

## 2023-12-03 DIAGNOSIS — R4182 Altered mental status, unspecified: Secondary | ICD-10-CM | POA: Diagnosis not present

## 2023-12-03 DIAGNOSIS — R531 Weakness: Secondary | ICD-10-CM | POA: Diagnosis not present

## 2023-12-03 DIAGNOSIS — N390 Urinary tract infection, site not specified: Secondary | ICD-10-CM | POA: Diagnosis not present

## 2023-12-03 DIAGNOSIS — F419 Anxiety disorder, unspecified: Secondary | ICD-10-CM | POA: Diagnosis not present

## 2023-12-04 ENCOUNTER — Inpatient Hospital Stay: Attending: Physician Assistant

## 2023-12-04 ENCOUNTER — Inpatient Hospital Stay (HOSPITAL_BASED_OUTPATIENT_CLINIC_OR_DEPARTMENT_OTHER): Admitting: Physician Assistant

## 2023-12-04 VITALS — BP 109/69 | HR 79 | Temp 98.1°F | Resp 18

## 2023-12-04 DIAGNOSIS — D7589 Other specified diseases of blood and blood-forming organs: Secondary | ICD-10-CM | POA: Insufficient documentation

## 2023-12-04 DIAGNOSIS — D539 Nutritional anemia, unspecified: Secondary | ICD-10-CM | POA: Insufficient documentation

## 2023-12-04 DIAGNOSIS — D696 Thrombocytopenia, unspecified: Secondary | ICD-10-CM | POA: Insufficient documentation

## 2023-12-04 DIAGNOSIS — I10 Essential (primary) hypertension: Secondary | ICD-10-CM | POA: Diagnosis not present

## 2023-12-04 DIAGNOSIS — G4730 Sleep apnea, unspecified: Secondary | ICD-10-CM | POA: Insufficient documentation

## 2023-12-04 DIAGNOSIS — K3189 Other diseases of stomach and duodenum: Secondary | ICD-10-CM | POA: Insufficient documentation

## 2023-12-04 DIAGNOSIS — Z7901 Long term (current) use of anticoagulants: Secondary | ICD-10-CM | POA: Insufficient documentation

## 2023-12-04 DIAGNOSIS — Z860101 Personal history of adenomatous and serrated colon polyps: Secondary | ICD-10-CM | POA: Diagnosis not present

## 2023-12-04 DIAGNOSIS — Z8249 Family history of ischemic heart disease and other diseases of the circulatory system: Secondary | ICD-10-CM | POA: Insufficient documentation

## 2023-12-04 DIAGNOSIS — Z882 Allergy status to sulfonamides status: Secondary | ICD-10-CM | POA: Insufficient documentation

## 2023-12-04 DIAGNOSIS — K648 Other hemorrhoids: Secondary | ICD-10-CM | POA: Insufficient documentation

## 2023-12-04 DIAGNOSIS — Z881 Allergy status to other antibiotic agents status: Secondary | ICD-10-CM | POA: Diagnosis not present

## 2023-12-04 DIAGNOSIS — R5383 Other fatigue: Secondary | ICD-10-CM | POA: Insufficient documentation

## 2023-12-04 DIAGNOSIS — F32A Depression, unspecified: Secondary | ICD-10-CM | POA: Insufficient documentation

## 2023-12-04 DIAGNOSIS — K5730 Diverticulosis of large intestine without perforation or abscess without bleeding: Secondary | ICD-10-CM | POA: Diagnosis not present

## 2023-12-04 DIAGNOSIS — I480 Paroxysmal atrial fibrillation: Secondary | ICD-10-CM | POA: Insufficient documentation

## 2023-12-04 DIAGNOSIS — D500 Iron deficiency anemia secondary to blood loss (chronic): Secondary | ICD-10-CM

## 2023-12-04 DIAGNOSIS — R768 Other specified abnormal immunological findings in serum: Secondary | ICD-10-CM | POA: Diagnosis not present

## 2023-12-04 DIAGNOSIS — Z8719 Personal history of other diseases of the digestive system: Secondary | ICD-10-CM | POA: Diagnosis not present

## 2023-12-04 DIAGNOSIS — E119 Type 2 diabetes mellitus without complications: Secondary | ICD-10-CM | POA: Insufficient documentation

## 2023-12-04 DIAGNOSIS — Z79899 Other long term (current) drug therapy: Secondary | ICD-10-CM | POA: Insufficient documentation

## 2023-12-04 DIAGNOSIS — M2550 Pain in unspecified joint: Secondary | ICD-10-CM | POA: Diagnosis not present

## 2023-12-04 DIAGNOSIS — Z87442 Personal history of urinary calculi: Secondary | ICD-10-CM | POA: Insufficient documentation

## 2023-12-04 DIAGNOSIS — K7460 Unspecified cirrhosis of liver: Secondary | ICD-10-CM | POA: Diagnosis not present

## 2023-12-04 DIAGNOSIS — G479 Sleep disorder, unspecified: Secondary | ICD-10-CM | POA: Insufficient documentation

## 2023-12-04 DIAGNOSIS — G9341 Metabolic encephalopathy: Secondary | ICD-10-CM | POA: Diagnosis not present

## 2023-12-04 DIAGNOSIS — Z833 Family history of diabetes mellitus: Secondary | ICD-10-CM | POA: Insufficient documentation

## 2023-12-04 DIAGNOSIS — Z803 Family history of malignant neoplasm of breast: Secondary | ICD-10-CM | POA: Insufficient documentation

## 2023-12-04 DIAGNOSIS — Z9089 Acquired absence of other organs: Secondary | ICD-10-CM | POA: Insufficient documentation

## 2023-12-04 DIAGNOSIS — Z8261 Family history of arthritis: Secondary | ICD-10-CM | POA: Insufficient documentation

## 2023-12-04 LAB — CBC WITH DIFFERENTIAL/PLATELET
Abs Immature Granulocytes: 0.02 10*3/uL (ref 0.00–0.07)
Basophils Absolute: 0.1 10*3/uL (ref 0.0–0.1)
Basophils Relative: 1 %
Eosinophils Absolute: 0.1 10*3/uL (ref 0.0–0.5)
Eosinophils Relative: 1 %
HCT: 40.3 % (ref 39.0–52.0)
Hemoglobin: 13.2 g/dL (ref 13.0–17.0)
Immature Granulocytes: 0 %
Lymphocytes Relative: 20 %
Lymphs Abs: 1 10*3/uL (ref 0.7–4.0)
MCH: 35.1 pg — ABNORMAL HIGH (ref 26.0–34.0)
MCHC: 32.8 g/dL (ref 30.0–36.0)
MCV: 107.2 fL — ABNORMAL HIGH (ref 80.0–100.0)
Monocytes Absolute: 0.6 10*3/uL (ref 0.1–1.0)
Monocytes Relative: 12 %
Neutro Abs: 3.2 10*3/uL (ref 1.7–7.7)
Neutrophils Relative %: 66 %
Platelets: 149 10*3/uL — ABNORMAL LOW (ref 150–400)
RBC: 3.76 MIL/uL — ABNORMAL LOW (ref 4.22–5.81)
RDW: 17 % — ABNORMAL HIGH (ref 11.5–15.5)
WBC: 4.9 10*3/uL (ref 4.0–10.5)
nRBC: 0 % (ref 0.0–0.2)

## 2023-12-04 LAB — FERRITIN: Ferritin: 43 ng/mL (ref 24–336)

## 2023-12-04 LAB — COMPREHENSIVE METABOLIC PANEL WITH GFR
ALT: 42 U/L (ref 0–44)
AST: 48 U/L — ABNORMAL HIGH (ref 15–41)
Albumin: 2.7 g/dL — ABNORMAL LOW (ref 3.5–5.0)
Alkaline Phosphatase: 118 U/L (ref 38–126)
Anion gap: 12 (ref 5–15)
BUN: 15 mg/dL (ref 8–23)
CO2: 21 mmol/L — ABNORMAL LOW (ref 22–32)
Calcium: 9.3 mg/dL (ref 8.9–10.3)
Chloride: 103 mmol/L (ref 98–111)
Creatinine, Ser: 0.87 mg/dL (ref 0.61–1.24)
GFR, Estimated: >60 mL/min (ref >60)
Glucose, Bld: 368 mg/dL — ABNORMAL HIGH (ref 70–99)
Potassium: 3.5 mmol/L (ref 3.5–5.1)
Sodium: 136 mmol/L (ref 135–145)
Total Bilirubin: 1.1 mg/dL (ref 0.0–1.2)
Total Protein: 6.7 g/dL (ref 6.5–8.1)

## 2023-12-04 LAB — IRON AND TIBC
Iron: 133 ug/dL (ref 45–182)
Saturation Ratios: 54 % — ABNORMAL HIGH (ref 17.9–39.5)
TIBC: 245 ug/dL — ABNORMAL LOW (ref 250–450)
UIBC: 112 ug/dL

## 2023-12-04 NOTE — Telephone Encounter (Signed)
 Spoke with Facilities manager at [facility]. They will transport Tony Cantu to his appt. on 5/21. If pt. Discharges from facility before appt. she will give me a call so that I can f/u to make sure he is able to get to appt.

## 2023-12-04 NOTE — Patient Instructions (Addendum)
 [facility] **VISIT SUMMARY & IMPORTANT INSTRUCTIONS **   You were seen today by Rojelio Brenner PA-C for your follow-up visit.    IRON DEFICIENCY ANEMIA Your blood levels look much better! Your iron levels are still mildly low, but I do not want you to have any IV iron at this time.  I would like you to continue taking your iron tablet (ferrous sulfate 325 mg) daily along with stool softener.  LOW PLATELETS Your low platelets are related to your liver cirrhosis. Platelets remain mildly low but stable. You do not need any treatment for your low platelets at this time.  ABNORMAL PROTEIN: Your previous labs showed mildly elevated serum free light chains (a type of protein made by your body).  This is most likely related to your liver cirrhosis, since your labs did not show any evidence of cancerous protein.  We will recheck these labs in December 2025.  FOLLOW-UP APPOINTMENT: Labs and office visit in 3 months  ** Thank you for trusting me with your healthcare!  I strive to provide all of my patients with quality care at each visit.  If you receive a survey for this visit, I would be so grateful to you for taking the time to provide feedback.  Thank you in advance!  ~ Assia Meanor                   Dr. Doreatha Massed   &   Rojelio Brenner, PA-C   - - - - - - - - - - - - - - - - - -    Thank you for choosing [facility] at [facility] to provide your oncology and hematology care.  To afford each patient quality time with our provider, please arrive at least 15 minutes before your scheduled appointment time.   If you have a lab appointment with the Cancer Center please come in thru the Main Entrance and check in at the main information desk.  You need to re-schedule your appointment should you arrive 10 or more minutes late.  We strive to give you quality time with our providers, and arriving late affects you and other patients  whose appointments are after yours.  Also, if you no show three or more times for appointments you may be dismissed from the clinic at the providers discretion.     Again, thank you for choosing [facility].  Our hope is that these requests will decrease the amount of time that you wait before being seen by our physicians.       _____________________________________________________________  Should you have questions after your visit to [facility], please contact our office at [phone] and follow the prompts.  Our office hours are 8:00 a.m. and 4:30 p.m. Monday - Friday.  Please note that voicemails left after 4:00 p.m. may not be returned until the following business day.  We are closed weekends and major holidays.  You do have access to a nurse 24-7, just call the main number to the clinic [phone] and do not press any options, hold on the line and a nurse will answer the phone.    For prescription refill requests, have your pharmacy contact our office and allow 72 hours.

## 2023-12-05 DIAGNOSIS — Z7409 Other reduced mobility: Secondary | ICD-10-CM | POA: Diagnosis not present

## 2023-12-05 DIAGNOSIS — N390 Urinary tract infection, site not specified: Secondary | ICD-10-CM | POA: Diagnosis not present

## 2023-12-05 DIAGNOSIS — R531 Weakness: Secondary | ICD-10-CM | POA: Diagnosis not present

## 2023-12-05 DIAGNOSIS — E559 Vitamin D deficiency, unspecified: Secondary | ICD-10-CM | POA: Diagnosis not present

## 2023-12-06 DIAGNOSIS — N390 Urinary tract infection, site not specified: Secondary | ICD-10-CM | POA: Diagnosis not present

## 2023-12-06 DIAGNOSIS — Z7409 Other reduced mobility: Secondary | ICD-10-CM | POA: Diagnosis not present

## 2023-12-06 DIAGNOSIS — K567 Ileus, unspecified: Secondary | ICD-10-CM | POA: Diagnosis not present

## 2023-12-06 DIAGNOSIS — R531 Weakness: Secondary | ICD-10-CM | POA: Diagnosis not present

## 2023-12-08 ENCOUNTER — Inpatient Hospital Stay (HOSPITAL_COMMUNITY)
Admission: EM | Admit: 2023-12-08 | Discharge: 2024-01-11 | DRG: 432 | Disposition: A | Discharge status: Skilled Nursing Facility | Source: Skilled Nursing Facility | Attending: Internal Medicine | Admitting: Internal Medicine

## 2023-12-08 ENCOUNTER — Encounter (HOSPITAL_COMMUNITY): Payer: Self-pay | Admitting: Emergency Medicine

## 2023-12-08 ENCOUNTER — Emergency Department (HOSPITAL_COMMUNITY)

## 2023-12-08 ENCOUNTER — Other Ambulatory Visit: Payer: Self-pay

## 2023-12-08 ENCOUNTER — Inpatient Hospital Stay (HOSPITAL_COMMUNITY)

## 2023-12-08 DIAGNOSIS — K7682 Hepatic encephalopathy: Secondary | ICD-10-CM | POA: Diagnosis not present

## 2023-12-08 DIAGNOSIS — K7469 Other cirrhosis of liver: Secondary | ICD-10-CM | POA: Diagnosis not present

## 2023-12-08 DIAGNOSIS — E1159 Type 2 diabetes mellitus with other circulatory complications: Secondary | ICD-10-CM | POA: Diagnosis present

## 2023-12-08 DIAGNOSIS — Z635 Disruption of family by separation and divorce: Secondary | ICD-10-CM

## 2023-12-08 DIAGNOSIS — N400 Enlarged prostate without lower urinary tract symptoms: Secondary | ICD-10-CM | POA: Diagnosis not present

## 2023-12-08 DIAGNOSIS — K7581 Nonalcoholic steatohepatitis (NASH): Secondary | ICD-10-CM

## 2023-12-08 DIAGNOSIS — K920 Hematemesis: Secondary | ICD-10-CM | POA: Diagnosis not present

## 2023-12-08 DIAGNOSIS — K5730 Diverticulosis of large intestine without perforation or abscess without bleeding: Secondary | ICD-10-CM | POA: Diagnosis present

## 2023-12-08 DIAGNOSIS — K31811 Angiodysplasia of stomach and duodenum with bleeding: Secondary | ICD-10-CM | POA: Diagnosis present

## 2023-12-08 DIAGNOSIS — R54 Age-related physical debility: Secondary | ICD-10-CM | POA: Diagnosis present

## 2023-12-08 DIAGNOSIS — Z820 Family history of epilepsy and other diseases of the nervous system: Secondary | ICD-10-CM

## 2023-12-08 DIAGNOSIS — F32A Depression, unspecified: Secondary | ICD-10-CM | POA: Diagnosis not present

## 2023-12-08 DIAGNOSIS — Z1152 Encounter for screening for COVID-19: Secondary | ICD-10-CM | POA: Diagnosis not present

## 2023-12-08 DIAGNOSIS — Z7901 Long term (current) use of anticoagulants: Secondary | ICD-10-CM | POA: Diagnosis not present

## 2023-12-08 DIAGNOSIS — G9340 Encephalopathy, unspecified: Secondary | ICD-10-CM

## 2023-12-08 DIAGNOSIS — K921 Melena: Secondary | ICD-10-CM

## 2023-12-08 DIAGNOSIS — I864 Gastric varices: Secondary | ICD-10-CM | POA: Diagnosis present

## 2023-12-08 DIAGNOSIS — L89153 Pressure ulcer of sacral region, stage 3: Secondary | ICD-10-CM | POA: Diagnosis not present

## 2023-12-08 DIAGNOSIS — K760 Fatty (change of) liver, not elsewhere classified: Secondary | ICD-10-CM

## 2023-12-08 DIAGNOSIS — Z79890 Hormone replacement therapy (postmenopausal): Secondary | ICD-10-CM

## 2023-12-08 DIAGNOSIS — Z96611 Presence of right artificial shoulder joint: Secondary | ICD-10-CM | POA: Diagnosis present

## 2023-12-08 DIAGNOSIS — I350 Nonrheumatic aortic (valve) stenosis: Secondary | ICD-10-CM | POA: Diagnosis present

## 2023-12-08 DIAGNOSIS — Z8261 Family history of arthritis: Secondary | ICD-10-CM

## 2023-12-08 DIAGNOSIS — I5189 Other ill-defined heart diseases: Secondary | ICD-10-CM | POA: Diagnosis not present

## 2023-12-08 DIAGNOSIS — E11649 Type 2 diabetes mellitus with hypoglycemia without coma: Secondary | ICD-10-CM | POA: Diagnosis not present

## 2023-12-08 DIAGNOSIS — I479 Paroxysmal tachycardia, unspecified: Secondary | ICD-10-CM | POA: Diagnosis present

## 2023-12-08 DIAGNOSIS — E1065 Type 1 diabetes mellitus with hyperglycemia: Secondary | ICD-10-CM | POA: Diagnosis not present

## 2023-12-08 DIAGNOSIS — Z4682 Encounter for fitting and adjustment of non-vascular catheter: Secondary | ICD-10-CM | POA: Diagnosis not present

## 2023-12-08 DIAGNOSIS — K7460 Unspecified cirrhosis of liver: Secondary | ICD-10-CM | POA: Diagnosis not present

## 2023-12-08 DIAGNOSIS — Z860101 Personal history of adenomatous and serrated colon polyps: Secondary | ICD-10-CM

## 2023-12-08 DIAGNOSIS — Z7401 Bed confinement status: Secondary | ICD-10-CM | POA: Diagnosis not present

## 2023-12-08 DIAGNOSIS — R9389 Abnormal findings on diagnostic imaging of other specified body structures: Secondary | ICD-10-CM | POA: Diagnosis not present

## 2023-12-08 DIAGNOSIS — R109 Unspecified abdominal pain: Secondary | ICD-10-CM

## 2023-12-08 DIAGNOSIS — R58 Hemorrhage, not elsewhere classified: Secondary | ICD-10-CM | POA: Diagnosis not present

## 2023-12-08 DIAGNOSIS — K2100 Gastro-esophageal reflux disease with esophagitis, without bleeding: Secondary | ICD-10-CM

## 2023-12-08 DIAGNOSIS — Z96653 Presence of artificial knee joint, bilateral: Secondary | ICD-10-CM | POA: Diagnosis present

## 2023-12-08 DIAGNOSIS — E1169 Type 2 diabetes mellitus with other specified complication: Secondary | ICD-10-CM

## 2023-12-08 DIAGNOSIS — Z8249 Family history of ischemic heart disease and other diseases of the circulatory system: Secondary | ICD-10-CM

## 2023-12-08 DIAGNOSIS — R531 Weakness: Secondary | ICD-10-CM | POA: Diagnosis not present

## 2023-12-08 DIAGNOSIS — D62 Acute posthemorrhagic anemia: Secondary | ICD-10-CM

## 2023-12-08 DIAGNOSIS — E870 Hyperosmolality and hypernatremia: Secondary | ICD-10-CM | POA: Diagnosis not present

## 2023-12-08 DIAGNOSIS — Z604 Social exclusion and rejection: Secondary | ICD-10-CM | POA: Diagnosis present

## 2023-12-08 DIAGNOSIS — R001 Bradycardia, unspecified: Secondary | ICD-10-CM | POA: Diagnosis not present

## 2023-12-08 DIAGNOSIS — K3189 Other diseases of stomach and duodenum: Secondary | ICD-10-CM | POA: Diagnosis present

## 2023-12-08 DIAGNOSIS — D6959 Other secondary thrombocytopenia: Secondary | ICD-10-CM | POA: Diagnosis not present

## 2023-12-08 DIAGNOSIS — Z91411 Personal history of adult psychological abuse: Secondary | ICD-10-CM

## 2023-12-08 DIAGNOSIS — R5383 Other fatigue: Secondary | ICD-10-CM

## 2023-12-08 DIAGNOSIS — Z833 Family history of diabetes mellitus: Secondary | ICD-10-CM

## 2023-12-08 DIAGNOSIS — R159 Full incontinence of feces: Secondary | ICD-10-CM | POA: Diagnosis not present

## 2023-12-08 DIAGNOSIS — I5032 Chronic diastolic (congestive) heart failure: Secondary | ICD-10-CM | POA: Diagnosis present

## 2023-12-08 DIAGNOSIS — K429 Umbilical hernia without obstruction or gangrene: Secondary | ICD-10-CM | POA: Diagnosis not present

## 2023-12-08 DIAGNOSIS — K7290 Hepatic failure, unspecified without coma: Secondary | ICD-10-CM | POA: Diagnosis not present

## 2023-12-08 DIAGNOSIS — I6782 Cerebral ischemia: Secondary | ICD-10-CM | POA: Diagnosis not present

## 2023-12-08 DIAGNOSIS — E1165 Type 2 diabetes mellitus with hyperglycemia: Secondary | ICD-10-CM | POA: Diagnosis not present

## 2023-12-08 DIAGNOSIS — E876 Hypokalemia: Secondary | ICD-10-CM | POA: Diagnosis present

## 2023-12-08 DIAGNOSIS — R4182 Altered mental status, unspecified: Secondary | ICD-10-CM | POA: Diagnosis not present

## 2023-12-08 DIAGNOSIS — Z8711 Personal history of peptic ulcer disease: Secondary | ICD-10-CM

## 2023-12-08 DIAGNOSIS — E1122 Type 2 diabetes mellitus with diabetic chronic kidney disease: Secondary | ICD-10-CM

## 2023-12-08 DIAGNOSIS — K648 Other hemorrhoids: Secondary | ICD-10-CM | POA: Diagnosis present

## 2023-12-08 DIAGNOSIS — Z66 Do not resuscitate: Secondary | ICD-10-CM | POA: Diagnosis not present

## 2023-12-08 DIAGNOSIS — Z888 Allergy status to other drugs, medicaments and biological substances status: Secondary | ICD-10-CM

## 2023-12-08 DIAGNOSIS — K31819 Angiodysplasia of stomach and duodenum without bleeding: Secondary | ICD-10-CM | POA: Diagnosis not present

## 2023-12-08 DIAGNOSIS — G40909 Epilepsy, unspecified, not intractable, without status epilepticus: Secondary | ICD-10-CM | POA: Diagnosis present

## 2023-12-08 DIAGNOSIS — Z882 Allergy status to sulfonamides status: Secondary | ICD-10-CM

## 2023-12-08 DIAGNOSIS — J4489 Other specified chronic obstructive pulmonary disease: Secondary | ICD-10-CM | POA: Diagnosis present

## 2023-12-08 DIAGNOSIS — J45909 Unspecified asthma, uncomplicated: Secondary | ICD-10-CM | POA: Diagnosis not present

## 2023-12-08 DIAGNOSIS — Z6825 Body mass index (BMI) 25.0-25.9, adult: Secondary | ICD-10-CM

## 2023-12-08 DIAGNOSIS — K922 Gastrointestinal hemorrhage, unspecified: Secondary | ICD-10-CM

## 2023-12-08 DIAGNOSIS — G4733 Obstructive sleep apnea (adult) (pediatric): Secondary | ICD-10-CM

## 2023-12-08 DIAGNOSIS — Z794 Long term (current) use of insulin: Secondary | ICD-10-CM | POA: Diagnosis not present

## 2023-12-08 DIAGNOSIS — J449 Chronic obstructive pulmonary disease, unspecified: Secondary | ICD-10-CM

## 2023-12-08 DIAGNOSIS — M25562 Pain in left knee: Secondary | ICD-10-CM | POA: Diagnosis present

## 2023-12-08 DIAGNOSIS — M25561 Pain in right knee: Secondary | ICD-10-CM | POA: Diagnosis present

## 2023-12-08 DIAGNOSIS — R0989 Other specified symptoms and signs involving the circulatory and respiratory systems: Secondary | ICD-10-CM | POA: Diagnosis not present

## 2023-12-08 DIAGNOSIS — E669 Obesity, unspecified: Secondary | ICD-10-CM | POA: Diagnosis present

## 2023-12-08 DIAGNOSIS — D731 Hypersplenism: Secondary | ICD-10-CM | POA: Diagnosis present

## 2023-12-08 DIAGNOSIS — Z825 Family history of asthma and other chronic lower respiratory diseases: Secondary | ICD-10-CM

## 2023-12-08 DIAGNOSIS — I152 Hypertension secondary to endocrine disorders: Secondary | ICD-10-CM

## 2023-12-08 DIAGNOSIS — I959 Hypotension, unspecified: Secondary | ICD-10-CM | POA: Diagnosis not present

## 2023-12-08 DIAGNOSIS — I480 Paroxysmal atrial fibrillation: Secondary | ICD-10-CM

## 2023-12-08 DIAGNOSIS — Z751 Person awaiting admission to adequate facility elsewhere: Secondary | ICD-10-CM

## 2023-12-08 DIAGNOSIS — E785 Hyperlipidemia, unspecified: Secondary | ICD-10-CM

## 2023-12-08 DIAGNOSIS — N182 Chronic kidney disease, stage 2 (mild): Secondary | ICD-10-CM

## 2023-12-08 DIAGNOSIS — I700 Atherosclerosis of aorta: Secondary | ICD-10-CM | POA: Diagnosis not present

## 2023-12-08 DIAGNOSIS — E872 Acidosis, unspecified: Secondary | ICD-10-CM | POA: Diagnosis present

## 2023-12-08 DIAGNOSIS — I951 Orthostatic hypotension: Secondary | ICD-10-CM | POA: Diagnosis not present

## 2023-12-08 DIAGNOSIS — I4891 Unspecified atrial fibrillation: Secondary | ICD-10-CM | POA: Diagnosis not present

## 2023-12-08 DIAGNOSIS — Z8679 Personal history of other diseases of the circulatory system: Secondary | ICD-10-CM

## 2023-12-08 DIAGNOSIS — D649 Anemia, unspecified: Secondary | ICD-10-CM | POA: Diagnosis not present

## 2023-12-08 DIAGNOSIS — I10 Essential (primary) hypertension: Secondary | ICD-10-CM | POA: Diagnosis not present

## 2023-12-08 DIAGNOSIS — N200 Calculus of kidney: Secondary | ICD-10-CM | POA: Diagnosis not present

## 2023-12-08 DIAGNOSIS — R1912 Hyperactive bowel sounds: Secondary | ICD-10-CM | POA: Diagnosis not present

## 2023-12-08 DIAGNOSIS — R1084 Generalized abdominal pain: Secondary | ICD-10-CM | POA: Diagnosis not present

## 2023-12-08 DIAGNOSIS — E43 Unspecified severe protein-calorie malnutrition: Secondary | ICD-10-CM | POA: Diagnosis not present

## 2023-12-08 DIAGNOSIS — Z87442 Personal history of urinary calculi: Secondary | ICD-10-CM

## 2023-12-08 DIAGNOSIS — K766 Portal hypertension: Secondary | ICD-10-CM | POA: Diagnosis not present

## 2023-12-08 DIAGNOSIS — Z881 Allergy status to other antibiotic agents status: Secondary | ICD-10-CM

## 2023-12-08 DIAGNOSIS — E119 Type 2 diabetes mellitus without complications: Secondary | ICD-10-CM

## 2023-12-08 DIAGNOSIS — Z7409 Other reduced mobility: Secondary | ICD-10-CM | POA: Diagnosis present

## 2023-12-08 DIAGNOSIS — R0602 Shortness of breath: Secondary | ICD-10-CM | POA: Diagnosis not present

## 2023-12-08 DIAGNOSIS — Z7984 Long term (current) use of oral hypoglycemic drugs: Secondary | ICD-10-CM

## 2023-12-08 DIAGNOSIS — R509 Fever, unspecified: Secondary | ICD-10-CM | POA: Diagnosis not present

## 2023-12-08 DIAGNOSIS — R933 Abnormal findings on diagnostic imaging of other parts of digestive tract: Secondary | ICD-10-CM

## 2023-12-08 DIAGNOSIS — Z79899 Other long term (current) drug therapy: Secondary | ICD-10-CM

## 2023-12-08 DIAGNOSIS — Z87891 Personal history of nicotine dependence: Secondary | ICD-10-CM

## 2023-12-08 DIAGNOSIS — I4510 Unspecified right bundle-branch block: Secondary | ICD-10-CM | POA: Diagnosis present

## 2023-12-08 DIAGNOSIS — K8689 Other specified diseases of pancreas: Secondary | ICD-10-CM | POA: Diagnosis not present

## 2023-12-08 DIAGNOSIS — R627 Adult failure to thrive: Secondary | ICD-10-CM | POA: Diagnosis present

## 2023-12-08 DIAGNOSIS — E8809 Other disorders of plasma-protein metabolism, not elsewhere classified: Secondary | ICD-10-CM | POA: Diagnosis present

## 2023-12-08 LAB — CBC WITH DIFFERENTIAL/PLATELET
Abs Immature Granulocytes: 0.02 10*3/uL (ref 0.00–0.07)
Basophils Absolute: 0 10*3/uL (ref 0.0–0.1)
Basophils Relative: 1 %
Eosinophils Absolute: 0 10*3/uL (ref 0.0–0.5)
Eosinophils Relative: 1 %
HCT: 32.4 % — ABNORMAL LOW (ref 39.0–52.0)
Hemoglobin: 10.6 g/dL — ABNORMAL LOW (ref 13.0–17.0)
Immature Granulocytes: 0 %
Lymphocytes Relative: 19 %
Lymphs Abs: 1.1 10*3/uL (ref 0.7–4.0)
MCH: 34.8 pg — ABNORMAL HIGH (ref 26.0–34.0)
MCHC: 32.7 g/dL (ref 30.0–36.0)
MCV: 106.2 fL — ABNORMAL HIGH (ref 80.0–100.0)
Monocytes Absolute: 0.7 10*3/uL (ref 0.1–1.0)
Monocytes Relative: 13 %
Neutro Abs: 3.9 10*3/uL (ref 1.7–7.7)
Neutrophils Relative %: 66 %
Platelets: 161 10*3/uL (ref 150–400)
RBC: 3.05 MIL/uL — ABNORMAL LOW (ref 4.22–5.81)
RDW: 17.3 % — ABNORMAL HIGH (ref 11.5–15.5)
WBC: 5.8 10*3/uL (ref 4.0–10.5)
nRBC: 0 % (ref 0.0–0.2)

## 2023-12-08 LAB — URINALYSIS, W/ REFLEX TO CULTURE (INFECTION SUSPECTED)
Bilirubin Urine: NEGATIVE
Glucose, UA: NEGATIVE mg/dL
Hgb urine dipstick: NEGATIVE
Ketones, ur: NEGATIVE mg/dL
Leukocytes,Ua: NEGATIVE
Nitrite: NEGATIVE
Protein, ur: NEGATIVE mg/dL
Specific Gravity, Urine: 1.019 (ref 1.005–1.030)
pH: 7 (ref 5.0–8.0)

## 2023-12-08 LAB — COMPREHENSIVE METABOLIC PANEL WITH GFR
ALT: 47 U/L — ABNORMAL HIGH (ref 0–44)
AST: 75 U/L — ABNORMAL HIGH (ref 15–41)
Albumin: 2.4 g/dL — ABNORMAL LOW (ref 3.5–5.0)
Alkaline Phosphatase: 90 U/L (ref 38–126)
Anion gap: 10 (ref 5–15)
BUN: 11 mg/dL (ref 8–23)
CO2: 22 mmol/L (ref 22–32)
Calcium: 9.1 mg/dL (ref 8.9–10.3)
Chloride: 110 mmol/L (ref 98–111)
Creatinine, Ser: 0.85 mg/dL (ref 0.61–1.24)
GFR, Estimated: >60 mL/min (ref >60)
Glucose, Bld: 182 mg/dL — ABNORMAL HIGH (ref 70–99)
Potassium: 3.4 mmol/L — ABNORMAL LOW (ref 3.5–5.1)
Sodium: 142 mmol/L (ref 135–145)
Total Bilirubin: 0.9 mg/dL (ref 0.0–1.2)
Total Protein: 5.7 g/dL — ABNORMAL LOW (ref 6.5–8.1)

## 2023-12-08 LAB — I-STAT VENOUS BLOOD GAS, ED
Acid-Base Excess: 1 mmol/L (ref 0.0–2.0)
Bicarbonate: 24 mmol/L (ref 20.0–28.0)
Calcium, Ion: 1.19 mmol/L (ref 1.15–1.40)
HCT: 31 % — ABNORMAL LOW (ref 39.0–52.0)
Hemoglobin: 10.5 g/dL — ABNORMAL LOW (ref 13.0–17.0)
O2 Saturation: 99 %
Potassium: 3.3 mmol/L — ABNORMAL LOW (ref 3.5–5.1)
Sodium: 142 mmol/L (ref 135–145)
TCO2: 25 mmol/L (ref 22–32)
pCO2, Ven: 30.8 mmHg — ABNORMAL LOW (ref 44–60)
pH, Ven: 7.499 — ABNORMAL HIGH (ref 7.25–7.43)
pO2, Ven: 113 mmHg — ABNORMAL HIGH (ref 32–45)

## 2023-12-08 LAB — TYPE AND SCREEN
ABO/RH(D): B POS
Antibody Screen: NEGATIVE

## 2023-12-08 LAB — CBC
HCT: 32 % — ABNORMAL LOW (ref 39.0–52.0)
Hemoglobin: 10.8 g/dL — ABNORMAL LOW (ref 13.0–17.0)
MCH: 35.3 pg — ABNORMAL HIGH (ref 26.0–34.0)
MCHC: 33.8 g/dL (ref 30.0–36.0)
MCV: 104.6 fL — ABNORMAL HIGH (ref 80.0–100.0)
Platelets: 145 10*3/uL — ABNORMAL LOW (ref 150–400)
RBC: 3.06 MIL/uL — ABNORMAL LOW (ref 4.22–5.81)
RDW: 17.2 % — ABNORMAL HIGH (ref 11.5–15.5)
WBC: 5.3 10*3/uL (ref 4.0–10.5)
nRBC: 0 % (ref 0.0–0.2)

## 2023-12-08 LAB — LIPASE, BLOOD: Lipase: 18 U/L (ref 11–51)

## 2023-12-08 LAB — RESP PANEL BY RT-PCR (RSV, FLU A&B, COVID)  RVPGX2
Influenza A by PCR: NEGATIVE
Influenza B by PCR: NEGATIVE
Resp Syncytial Virus by PCR: NEGATIVE
SARS Coronavirus 2 by RT PCR: NEGATIVE

## 2023-12-08 LAB — POC OCCULT BLOOD, ED: Fecal Occult Bld: POSITIVE — AB

## 2023-12-08 LAB — AMMONIA: Ammonia: 111 umol/L — ABNORMAL HIGH (ref 9–35)

## 2023-12-08 LAB — GLUCOSE, CAPILLARY: Glucose-Capillary: 80 mg/dL (ref 70–99)

## 2023-12-08 LAB — BETA-HYDROXYBUTYRIC ACID: Beta-Hydroxybutyric Acid: 0.34 mmol/L — ABNORMAL HIGH (ref 0.05–0.27)

## 2023-12-08 LAB — TSH: TSH: 1.469 u[IU]/mL (ref 0.350–4.500)

## 2023-12-08 LAB — PROTIME-INR
INR: 1.9 — ABNORMAL HIGH (ref 0.8–1.2)
Prothrombin Time: 21.8 s — ABNORMAL HIGH (ref 11.4–15.2)

## 2023-12-08 LAB — I-STAT CG4 LACTIC ACID, ED: Lactic Acid, Venous: 1.6 mmol/L (ref 0.5–1.9)

## 2023-12-08 MED ORDER — DEXTROSE 50 % IV SOLN
25.0000 g | INTRAVENOUS | Status: AC
  Administered 2023-12-08: 25 g via INTRAVENOUS
  Filled 2023-12-08: qty 50

## 2023-12-08 MED ORDER — ACETAMINOPHEN 650 MG RE SUPP: 650.0000 mg | Freq: Four times a day (QID) | RECTAL | Status: DC | PRN

## 2023-12-08 MED ORDER — ACETAMINOPHEN 325 MG PO TABS: 650.0000 mg | ORAL_TABLET | Freq: Four times a day (QID) | ORAL | Status: DC | PRN

## 2023-12-08 MED ORDER — SODIUM CHLORIDE 0.9% FLUSH
3.0000 mL | Freq: Two times a day (BID) | INTRAVENOUS | Status: DC
  Administered 2023-12-08 – 2024-01-11 (×62): 3 mL via INTRAVENOUS

## 2023-12-08 MED ORDER — PROTHROMBIN COMPLEX CONC HUMAN 500 UNITS IV KIT
2172.0000 [IU] | PACK | Status: AC
  Administered 2023-12-08: 2172 [IU] via INTRAVENOUS
  Filled 2023-12-08: qty 2172

## 2023-12-08 MED ORDER — OCTREOTIDE LOAD VIA INFUSION
50.0000 ug | Freq: Once | INTRAVENOUS | Status: AC
  Administered 2023-12-08: 50 ug via INTRAVENOUS
  Filled 2023-12-08: qty 25

## 2023-12-08 MED ORDER — LACTULOSE 10 GM/15ML PO SOLN
10.0000 g | Freq: Once | ORAL | Status: AC
  Administered 2023-12-08: 10 g via ORAL
  Filled 2023-12-08: qty 30

## 2023-12-08 MED ORDER — PANTOPRAZOLE SODIUM 40 MG IV SOLR
40.0000 mg | Freq: Once | INTRAVENOUS | Status: DC
Start: 2023-12-08 — End: 2023-12-09

## 2023-12-08 MED ORDER — INSULIN ASPART 100 UNIT/ML IJ SOLN
0.0000 [IU] | INTRAMUSCULAR | Status: DC
  Administered 2023-12-09: 1 [IU] via SUBCUTANEOUS

## 2023-12-08 MED ORDER — SODIUM CHLORIDE 0.9 % IV BOLUS
500.0000 mL | Freq: Once | INTRAVENOUS | Status: AC
  Administered 2023-12-08: 500 mL via INTRAVENOUS

## 2023-12-08 MED ORDER — OCTREOTIDE ACETATE 500 MCG/ML IJ SOLN
50.0000 ug/h | INTRAMUSCULAR | Status: DC
  Administered 2023-12-08: 50 ug/h via INTRAVENOUS
  Filled 2023-12-08: qty 1

## 2023-12-08 MED ORDER — IOHEXOL 350 MG/ML SOLN
75.0000 mL | Freq: Once | INTRAVENOUS | Status: AC | PRN
  Administered 2023-12-08: 75 mL via INTRAVENOUS

## 2023-12-08 NOTE — Consult Note (Addendum)
 Consultation  Referring Provider: ER MD [facility] Primary Care Physician:  [name], DO Primary Gastroenterologist:  Dr. Rhea Belton  Reason for Consultation: Melena, coffee-ground emesis, lethargy in patient with known cirrhosis  HPI: Tony Cantu is a 73 y.o. male with history of NASH/cirrhosis, portal hypertension, atrial fibrillation on chronic Eliquis, severe aortic stenosis, hypertension, sleep apnea, diabetes mellitus and prior history of colon polyps. EGD and colonoscopy per Dr. 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  He was also seen by GI in January 2025 when he was admitted with sepsis and there was concern for endocarditis.  EGD was requested prior to TEE.  Did not have any evidence of esophageal varices again noted gastric antral vascular ectasia without any bleeding scattered nonbleeding AVMs portal gastropathy and no evidence of gastric varices.  He was also seen in late February 2025 sent from the emergency room after syncopal episode with hypotension, found to have severe hyperglycemia and significant lactic acidosis also transferred from [facility] ER here due to concerns for Sepsis.  He did not manifest any active bleeding, and we did not pursue endoscopic evaluation of others not clear that he had had any active GI bleeding.  He was brought from his nursing facility today after found lethargic, nurse reported been having dark stools over the past couple of days and he may have vomited as he has dark black material around his mouth. He is currently hemodynamically stable, unable to offer any history  He is currently receiving Eliquis, also on oral iron  Labs in the ER-venous ammonia 111 WBC 5.8/hemoglobin  10.6/hematocrit 32.4/platelets 161 Potassium 3.4/BUN 11/creatinine 0.85 Albumin 2.4 AST 75/ALT 27/alk phos 90/T. bili 0.9 Respiratory panel negative Lactate 1.6 INR pending  Will calculate MEL D once INR results  On exam today with nausea, arousable but unable to participate in any conversation, grossly melenic stool on rectal exam  CT abdomen pelvis, lung bases appear clear dense mitral and aortic valve calcifications cirrhotic appearing liver no focal mass, mild circumferential thickening of the rectum noted moderate perirectal fat stranding favor proctitis, tiny umbilical hernia, no ascites.  CT head open no acute intracranial abnormality possible chronic lacunar infarct, chronic small vessel disease, generalized parenchymal atrophy   Past Medical History:  Diagnosis Date   Acute renal injury (HCC) 08/06/2016   Aortic atherosclerosis (HCC)    Aortic stenosis    Arthritis    Asthma    BPH (benign prostatic hypertrophy)    Cirrhosis (HCC) 2016   Colon polyps    Diabetes mellitus without complication (HCC)    TYPE 1    Diverticulosis    DKA (diabetic ketoacidosis) (HCC) 10/25/2023   Dysrhythmia 04/19/2021   A-fib noted with possible A-fib RVR   Gastric ulcer    Gastric varices    right   Gastritis    GERD (gastroesophageal reflux disease)    Headache    History- pt states these were migraines that occurred in the 1980's   Heart murmur    History of kidney stones    Hx of adenomatous colonic polyps    Hypertension    Iron deficiency anemia due to chronic blood loss 08/16/2023   Melena 09/20/2023   Portal hypertensive gastropathy (HCC)    Postoperative  fever 03/10/2022   Renal cyst, right    Seizures (HCC)    HYPOGLYCEMIC LAST 1 AND 1/2 YRS AGO   Sepsis (HCC) 09/20/2023   Sleep apnea    uses CPAP nightly   Stroke-like symptom 09/20/2023   Testicle trouble    one testicle BORN WITH   Tubular adenoma of colon     Past Surgical History:  Procedure Laterality  Date   BACK SURGERY     89  LOWER    CARPAL TUNNEL RELEASE Right 10/13/2015   Procedure: RIGHT CARPAL TUNNEL RELEASE;  Surgeon: Cindee Salt, MD;  Location: [facility];  Service: Orthopedics;  Laterality: Right;   CARPAL TUNNEL RELEASE Left 07/05/2016   Procedure: LEFT CARPAL TUNNEL RELEASE;  Surgeon: Cindee Salt, MD;  Location: [facility];  Service: Orthopedics;  Laterality: Left;   COLONOSCOPY WITH ESOPHAGOGASTRODUODENOSCOPY (EGD)  10/05/2022   DRUG INDUCED ENDOSCOPY N/A 07/02/2021   Procedure: DRUG INDUCED SLEEP ENDOSCOPY;  Surgeon: Christia Reading, MD;  Location[facility] OR;  Service: ENT;  Laterality: N/A;   ESOPHAGOGASTRODUODENOSCOPY (EGD) WITH PROPOFOL N/A 09/25/2023   Procedure: ESOPHAGOGASTRODUODENOSCOPY (EGD) WITH PROPOFOL;  Surgeon: Sherrilyn Rist, MD;  Location: [facility] ENDOSCOPY;  Service: Gastroenterology;  Laterality: N/A;  EGD needs to be coordinated with TEE.  Patient has severe aortic stenosis, procedures need to be done to follow each other with 1 sedation   IMPLANTATION OF HYPOGLOSSAL NERVE STIMULATOR Right 10/01/2021   Procedure: IMPLANTATION OF HYPOGLOSSAL NERVE STIMULATOR;  Surgeon: Christia Reading, MD;  Location: [facility] OR;  Service: ENT;  Laterality: Right;   INGUINAL HERNIA REPAIR  2003   right    KNEE ARTHROSCOPY Right 03/03/2016   LUMBAR DISC SURGERY  03/1995   Dr. Daleen Squibb, discectomy   LUMBAR LAMINECTOMY/DECOMPRESSION MICRODISCECTOMY Right 10/06/2016   Procedure: RIGHT LUMBAR THREE - LUMBAR FOUR  LAMINECTOMY, FORAMINOTOMY AND MICRODISCECTOMY;  Surgeon: Shirlean Kelly, MD;  Location: [facility] OR;  Service: Neurosurgery;  Laterality: Right;   SHOULDER SURGERY  11/28/2005   left partial   SHOULDER SURGERY  07/14/2006   RIGHT   TEE WITHOUT CARDIOVERSION N/A 09/25/2023   Procedure: TRANSESOPHAGEAL ECHOCARDIOGRAM (TEE);  Surgeon: Dolores Patty, MD;  Location: [facility] ENDOSCOPY;  Service: Cardiovascular;  Laterality: N/A;   TONSILLECTOMY  AGE 65 OR 4   TOTAL  KNEE ARTHROPLASTY Right 03/07/2022   Procedure: RIGHT TOTAL KNEE ARTHROPLASTY;  Surgeon: Gean Birchwood, MD;  Location: [facility] ORS;  Service: Orthopedics;  Laterality: Right;   TOTAL KNEE ARTHROPLASTY Left 06/06/2022   Procedure: LEFT TOTAL KNEE ARTHROPLASTY;  Surgeon: Gean Birchwood, MD;  Location: [facility] ORS;  Service: Orthopedics;  Laterality: Left;   TOTAL SHOULDER ARTHROPLASTY Right 11/01/2018   Procedure: RIGHT SHOULDER REVISION TO REVERSE TOTAL SHOULDER;  Surgeon: Jones Broom, MD;  Location: [facility] ORS;  Service: Orthopedics;  Laterality: Right;  CHOICE ANESTHESIA WITH INTERSCALENE BLOCK EXPAREL, NEEDS RNFA    Prior to Admission medications   Medication Sig Start Date End Date Taking? Authorizing Provider  acetaminophen (TYLENOL) 500 MG tablet Take 1,000 mg by mouth every 6 (six) hours as needed for moderate pain.   Yes [provider]  amiodarone (PACERONE) 200 MG tablet 400 mg  po daily from 3/7 to 3/11 200 mg  po daily from 3/12 Patient taking differently: Take 200 mg by mouth daily. 400 mg  po daily from 3/7 to 3/11 200 mg  po daily from 3/12 11/03/23  Yes Osvaldo Shipper, MD  apixaban (ELIQUIS) 5 MG TABS tablet Take 1 tablet (  5 mg total) by mouth 2 (two) times daily. 06/08/23  Yes Gottschalk, Kathie Rhodes M, DO  atorvastatin (LIPITOR) 40 MG tablet Take 1 tablet (40 mg total) by mouth daily. Patient taking differently: Take 40 mg by mouth daily at 6 PM. 06/08/23  Yes Delynn Flavin M, DO  b complex vitamins tablet Take 1 tablet by mouth daily.   Yes [provider]  busPIRone (BUSPAR) 5 MG tablet Take 5 mg by mouth 2 (two) times daily.   Yes [provider]  dextromethorphan-guaiFENesin (ROBITUSSIN-DM) 10-100 MG/5ML liquid Take 10 mLs by mouth in the morning and at bedtime.   Yes [provider]  ergocalciferol (VITAMIN D2) 1.25 MG (50000 UT) capsule Take 50,000 Units by mouth See admin instructions. Take 1 capsule (50,000 units) by mouth twice weekly on Wednesday  and Sunday.   Yes [provider]  ferrous sulfate 325 (65 FE) MG EC tablet Take 1 tablet (325 mg total) by mouth daily with breakfast. 01/18/23  Yes Pennington, Rebekah M, PA-C  Glucagon, rDNA, (GLUCAGON EMERGENCY) 1 MG KIT INJECT AS DIRECTED INTO  UPPER ARM, THIGH OR  BUTTOCKS AS NEEDED FOR  SEVERE HYPOGLYCEMIA. SEEK  MEDICAL ATTENTION AFTER USE 06/14/21  Yes Deliah Boston F, FNP  Glucerna [facility]) LIQD Take 120-237 mLs by mouth See admin instructions. Drink 1 bottle 3 times daily, after meals at 1000, 1300 and 1800. If BS < 60, provide of Glucerna and recheck blood sugar in 15 minutes.   Yes [provider]  insulin glargine (LANTUS SOLOSTAR) 100 UNIT/ML Solostar Pen Inject 24 Units into the skin every 12 (twelve) hours.   Yes [provider]  insulin lispro (HUMALOG KWIKPEN) 100 UNIT/ML KwikPen Inject 2-15 Units into the skin See admin instructions. Inject 2-15 units subcutaneously three times daily before meals and at bedtime, per sliding scale: 121-150 : 2 units 151-200 : 3 units 201-250 : 5 units 251-300 : 8 units 301-350 : 11 units 351-400 : 15 units  Notify provider if BS < 70 or > 400.   Yes [provider]  insulin lispro (HUMALOG) 100 UNIT/ML injection Inject 8 Units into the skin 3 (three) times daily before meals.   Yes [provider]  magnesium oxide (MAG-OX) 400 (240 Mg) MG tablet Take 400 mg by mouth daily.   Yes [provider]  montelukast (SINGULAIR) 10 MG tablet Take 1 tablet (10 mg total) by mouth at bedtime. 06/08/23  Yes Delynn Flavin M, DO  Multiple Vitamins-Minerals (MULTIVITAMIN WITH MINERALS) tablet Take 1 tablet by mouth daily.   Yes [provider]  pantoprazole (PROTONIX) 40 MG tablet TAKE 1 TABLET BY MOUTH TWICE  DAILY BEFORE MEALS 10/16/23  Yes Pyrtle, Carie Caddy, MD  potassium chloride SA (KLOR-CON M) 20 MEQ tablet Take 20 mEq by mouth daily.   Yes [provider]  ramelteon (ROZEREM) 8  MG tablet Take 1 tablet (8 mg total) by mouth at bedtime. 11/03/23  Yes Osvaldo Shipper, MD  amoxicillin-clavulanate (AUGMENTIN) 875-125 MG tablet Take 1 tablet by mouth 2 (two) times daily. Patient not taking: Reported on 12/08/2023    [provider]    Current Facility-Administered Medications  Medication Dose Route Frequency Provider Last Rate Last Admin   acetaminophen (TYLENOL) tablet 650 mg  650 mg Oral Q6H PRN Synetta Fail, MD       Or   acetaminophen (TYLENOL) suppository 650 mg  650 mg Rectal Q6H PRN Synetta Fail, MD  lactulose (CHRONULAC) 10 GM/15ML solution 10 g  10 g Oral Once Tegeler, Canary Brim, MD       octreotide (SANDOSTATIN) 500 mcg in sodium chloride 0.9 % 250 mL (2 mcg/mL) infusion  50 mcg/hr Intravenous Continuous Synetta Fail, MD 25 mL/hr at 12/08/23 1716 50 mcg/hr at 12/08/23 1716   pantoprazole (PROTONIX) injection 40 mg  40 mg Intravenous Once Synetta Fail, MD       prothrombin complex conc human [facility]) IVPB 2,172 Units  2,172 Units Intravenous STAT Linwood Dibbles, MD       sodium chloride flush (NS) 0.9 % injection 3 mL  3 mL Intravenous Q12H Synetta Fail, MD       Current Outpatient Medications  Medication Sig Dispense Refill   acetaminophen (TYLENOL) 500 MG tablet Take 1,000 mg by mouth every 6 (six) hours as needed for moderate pain.     amiodarone (PACERONE) 200 MG tablet 400 mg  po daily from 3/7 to 3/11 200 mg  po daily from 3/12 (Patient taking differently: Take 200 mg by mouth daily. 400 mg  po daily from 3/7 to 3/11 200 mg  po daily from 3/12)     apixaban (ELIQUIS) 5 MG TABS tablet Take 1 tablet (5 mg total) by mouth 2 (two) times daily. 180 tablet 3   atorvastatin (LIPITOR) 40 MG tablet Take 1 tablet (40 mg total) by mouth daily. (Patient taking differently: Take 40 mg by mouth daily at 6 PM.) 90 tablet 3   b complex vitamins tablet Take 1 tablet by mouth daily.     busPIRone (BUSPAR) 5 MG tablet Take 5 mg  by mouth 2 (two) times daily.     dextromethorphan-guaiFENesin (ROBITUSSIN-DM) 10-100 MG/5ML liquid Take 10 mLs by mouth in the morning and at bedtime.     ergocalciferol (VITAMIN D2) 1.25 MG (50000 UT) capsule Take 50,000 Units by mouth See admin instructions. Take 1 capsule (50,000 units) by mouth twice weekly on Wednesday and Sunday.     ferrous sulfate 325 (65 FE) MG EC tablet Take 1 tablet (325 mg total) by mouth daily with breakfast. 90 tablet 3   Glucagon, rDNA, (GLUCAGON EMERGENCY) 1 MG KIT INJECT AS DIRECTED INTO  UPPER ARM, THIGH OR  BUTTOCKS AS NEEDED FOR  SEVERE HYPOGLYCEMIA. SEEK  MEDICAL ATTENTION AFTER USE 3 kit 0   Glucerna (GLUCERNA) LIQD Take 120-237 mLs by mouth See admin instructions. Drink 1 bottle 3 times daily, after meals at 1000, 1300 and 1800. If BS < 60, provide of Glucerna and recheck blood sugar in 15 minutes.     insulin glargine (LANTUS SOLOSTAR) 100 UNIT/ML Solostar Pen Inject 24 Units into the skin every 12 (twelve) hours.     insulin lispro (HUMALOG KWIKPEN) 100 UNIT/ML KwikPen Inject 2-15 Units into the skin See admin instructions. Inject 2-15 units subcutaneously three times daily before meals and at bedtime, per sliding scale: 121-150 : 2 units 151-200 : 3 units 201-250 : 5 units 251-300 : 8 units 301-350 : 11 units 351-400 : 15 units  Notify provider if BS < 70 or > 400.     insulin lispro (HUMALOG) 100 UNIT/ML injection Inject 8 Units into the skin 3 (three) times daily before meals.     magnesium oxide (MAG-OX) 400 (240 Mg) MG tablet Take 400 mg by mouth daily.     montelukast (SINGULAIR) 10 MG tablet Take 1 tablet (10 mg total) by mouth at bedtime. 90 tablet 3   Multiple  Vitamins-Minerals (MULTIVITAMIN WITH MINERALS) tablet Take 1 tablet by mouth daily.     pantoprazole (PROTONIX) 40 MG tablet TAKE 1 TABLET BY MOUTH TWICE  DAILY BEFORE MEALS 180 tablet 1   potassium chloride SA (KLOR-CON M) 20 MEQ tablet Take 20 mEq by mouth daily.     ramelteon  (ROZEREM) 8 MG tablet Take 1 tablet (8 mg total) by mouth at bedtime. 30 tablet 0   amoxicillin-clavulanate (AUGMENTIN) 875-125 MG tablet Take 1 tablet by mouth 2 (two) times daily. (Patient not taking: Reported on 12/08/2023)      Allergies as of 12/08/2023 - Review Complete 12/08/2023  Allergen Reaction Noted   Dimetapp children's cold-cough Other (See Comments) 02/24/2022   Ak-mycin [erythromycin] Diarrhea    Sulfonamide derivatives Diarrhea 06/22/2010    Family History  Problem Relation Age of Onset   Heart failure Mother    Heart attack Mother        in her 28's   Alzheimer's disease Mother    Diabetes Mother    Asthma Father    Suicidality Father        in his 45's   Allergic rhinitis Sister    Heart failure Maternal Grandmother    Rheum arthritis Maternal Grandmother    Breast cancer Maternal Grandmother    Heart attack Maternal Grandmother        in her 31's   Colon cancer Neg Hx    Esophageal cancer Neg Hx    Pancreatic cancer Neg Hx    Liver disease Neg Hx     Social History   Socioeconomic History   Marital status: Legally Separated    Spouse name: Not on file   Number of children: 1   Years of education: Not on file   Highest education level: Not on file  Occupational History   Occupation: retired   Tobacco Use   Smoking status: Former    Current packs/day: 0.00    Types: Cigarettes, Pipe    Start date: 1971    Quit date: 2005    Years since quitting: 20.2   Smokeless tobacco: Never   Tobacco comments:    quit 2005 smoked cigarettes for 5 yrs prior to pipe use  Vaping Use   Vaping status: Never Used  Substance and Sexual Activity   Alcohol use: Not Currently   Drug use: No   Sexual activity: Yes  Other Topics Concern   Not on file  Social History Narrative   ** Merged History Encounter **    Right handed    Social Drivers of Health   Financial Resource Strain: Low Risk  (06/09/2022)   Overall Financial Resource Strain (CARDIA)     Difficulty of Paying Living Expenses: Not hard at all  Food Insecurity: No Food Insecurity (10/30/2023)   Hunger Vital Sign    Worried About Running Out of Food in the Last Year: Never true    Ran Out of Food in the Last Year: Never true  Transportation Needs: No Transportation Needs (10/30/2023)   PRAPARE - Administrator, Civil Service (Medical): No    Lack of Transportation (Non-Medical): No  Physical Activity: Not on file  Stress: Not on file  Social Connections: Socially Isolated (10/30/2023)   Social Connection and Isolation Panel [NHANES]    Frequency of Communication with Friends and Family: Never    Frequency of Social Gatherings with Friends and Family: Never    Attends Religious Services: More than 4 times per year  Active Member of Clubs or Organizations: No    Attends Banker Meetings: Never    Marital Status: Separated  Intimate Partner Violence: At Risk (10/30/2023)   Humiliation, Afraid, Rape, and Kick questionnaire    Fear of Current or Ex-Partner: No    Emotionally Abused: Yes    Physically Abused: No    Sexually Abused: No    Review of Systems: Pertinent positive and negative review of systems were noted in the above HPI section.  All other review of systems was otherwise negative.   Physical Exam: Vital signs in last 24 hours: Temp:  [98.9 F (37.2 C)] 98.9 F (37.2 C) (04/11 1346) Pulse Rate:  [72] 72 (04/11 1327) Resp:  [13] 13 (04/11 1327) BP: (117)/(71) 117/71 (04/11 1327) SpO2:  [100 %] 100 % (04/11 1327)   General:   Alert,  Well-developed, chronically ill-appearing elderly white male , lethargic, arousable with exam , first when he moved him but no appropriate response Head:  Normocephalic and atraumatic. Eyes:  Sclera clear, no icterus.   Conjunctiva pink. Ears:  Normal auditory acuity. Nose:  No deformity, discharge,  or lesions. Mouth:  No deformity or lesions.  Dark crusted coffee-ground material around his lips Neck:   Supple; no masses or thyromegaly. Lungs:  Clear throughout to auscultation.   No wheezes, crackles, or rhonchi.  Heart:  Regular rate and rhythm; blowing systolic murmur.,  Abdomen:  Soft,nontender, BS active,nonpalp mass or hsm.  No appreciable fluid wave Rectal: Nonthrombosed grossly melenic stool on rectal exam but no tenderness Msk:  Symmetrical without gross deformities. . Pulses:  Normal pulses noted. Extremities:  Without clubbing or edema. Neurologic: Lethargic, arousable with movement unable to participate in any conversation, did say 1 word Skin:  Intact without significant lesions or rashes..   Intake/Output from previous day: No intake/output data recorded. Intake/Output this shift: No intake/output data recorded.  Lab Results: Recent Labs    12/08/23 1332 12/08/23 1406  WBC 5.8  --   HGB 10.6* 10.5*  HCT 32.4* 31.0*  PLT 161  --    BMET Recent Labs    12/08/23 1332 12/08/23 1406  NA 142 142  K 3.4* 3.3*  CL 110  --   CO2 22  --   GLUCOSE 182*  --   BUN 11  --   CREATININE 0.85  --   CALCIUM 9.1  --    LFT Recent Labs    12/08/23 1332  PROT 5.7*  ALBUMIN 2.4*  AST 75*  ALT 47*  ALKPHOS 90  BILITOT 0.9   PT/INR No results for input(s): "LABPROT", "INR" in the last 72 hours. Hepatitis Panel No results for input(s): "HEPBSAG", "HCVAB", "HEPAIGM", "HEPBIGM" in the last 72 hours.   IMPRESSION:  #53  73 year old white male with multiple serious comorbidities sent from his nursing home to the ER with lethargy, report of dark stools over the past couple of days and may have had coffee-ground emesis with dark blackish material around his mouth. Hemodynamically stable but clearly encephalopathic unable to participate in any conversation  Does have melenic stool on rectal exam  He does have history of cirrhosis but on recent endoscopic evaluation has not had any evidence of esophageal or gastric varices, does have evidence of portal gastropathy, AVMs  and probable GAVE.  BUN is normal  CT suggestive of proctitis  Most likely he has been bleeding from his stomach secondary to being and portal gastropathy, in the setting of Eliquis  #2  acute encephalopathy.  Likely this is hepatic encephalopathy  #3 anemia acute on chronic #4 MASH cirrhosis #5 atrial fibrillation-on Eliquis #6 severe aortic stenosis #7.  Insulin-dependent diabetes mellitus #8.  Hypertension #9  Recent endocarditis #10.  Seizure disorder  Plan; keep n.p.o., place NG Continue IV PPI twice daily Will discontinue octreotide Start lactulose 30 gm 4 times daily via NG Patient needs to be admitted and transfused stepdown Place second IV Discussed with ER MD regarding reversing Eliquis with Kcentra, ER will discuss with pharmacy Serial hemoglobins every 6 hours, transfuse as indicated Further discussions will need to be held with this family regarding goals of care, with his multiple comorbidities and multiple hospitalizations over the past few months.  He is currently a full code GI will follow closely with you, no EGD this evening, will reassess in a.m. and decide if EGD appropriate for tomorrow.     Amy Esterwood PA-C 12/08/2023, 5:34 PM  I have taken an interval history, thoroughly reviewed the chart and examined the patient. I agree with the Advanced Practitioner's note, impression and recommendations, and have recorded additional findings, impressions and recommendations below. I performed a substantive portion of this encounter (>50% time spent), including a complete performance of the medical decision making.  My additional thoughts are as follows:  Patient seen along with my PA in the ED. INR has since come back as 1.9\  Clinically significant GI bleeding with a large amount of melena on our exam, black blood around his mouth.  Fortunately he is vital signs are normal right now.  He is stuporous from hepatic encephalopathy as well, most likely precipitated  by acute GI bleeding.  No ascites on exam clinically or on CT abdomen and pelvis.  We spoke to the ED physician regarding the necessary resuscitation as outlined above.  He needs to at least be in the stepdown unit, another IV, Kcentra to reverse Eliquis (if he meets criteria for that (according to pharmacy) which I believe he does, no octreotide because he does not have gastric or esophageal varices (EGD December 2024).  Needs NG tube, lactulose as well.  We will need to contact his family tomorrow and understand his goals of care regarding intubation if he has worsening mental status or if needed in order to do upper endoscopy.   Charlie Pitter III Office:[phone]

## 2023-12-08 NOTE — ED Triage Notes (Signed)
 Pt BIBA from [facility] rehab w/ c/o lethargy. Baseline is A&O x4 and currently A&O 4 still, but has been very tired and staff stated that he is "not like himself." C/o black tarry stools x2 days w/ black crust around pts mouth. Pt is able to nod but too lethargic to speak. GCS 14. CBG 280. Hx: Afib, cirrhosis, parkinson, esophageal varracies. + blood thinners

## 2023-12-08 NOTE — TOC CM/SW Note (Signed)
 SW notified Rayna Sexton (Admission Director [facility] [phone] stating the patient will be admitted inpatient.   Lily Peer, MSW, LCSWA Transition of Care  Clinical Social Worker (ED 3-11 Mon-Fri)  [phone]

## 2023-12-08 NOTE — ED Notes (Signed)
 Bladder scanned 130 mL

## 2023-12-08 NOTE — ED Notes (Signed)
 Attempted to contact CCMD for patient monitoring, placed on hold with no answer.

## 2023-12-08 NOTE — H&P (Signed)
 History and Physical   Tony Cantu UJW:[id number] DOB: [birth year] DOA: 12/08/2023  PCP: [name], DO   Patient coming from: Facility  Chief Complaint: Altered mental status, dark stools  HPI: Tony Cantu is a 73 y.o. male with medical history significant of hypertension, hyperlipidemia, diabetes, paroxysmal A-fib, paroxysmal tachycardia, GERD, COPD/asthma, chronic diastolic CHF, NAFLD, cirrhosis, portal hypertension, obesity, OSA, BPH, presenting with altered mental status and nausea vomiting and dark stool.  History team with assistance of chart review and EMS report.  Patient reportedly at facility after fall.  Patient has been acting differently for the past 1 to 2 days.  He has had increased fatigue and somnolence.  Reportedly has had nausea and vomiting with black emesis and now black stools.  He will nod to answer questions but has not been speaking.  Unable to obtain full/accurate review of systems given patient's altered mentation.  ED Course: Vital signs in ED stable.  Lab workup included CMP with potassium 3.4,Glucose 182, protein 5.7, albumin 2.4, AST 75, ALT 47.  CBC with hemoglobin 10.6 which is down from twelve 5.13 in a few weeks ago.  PT/INR pending.  TSH pending.  Lipase normal.  Lactic acid normal.  FOBT positive.  Respiratory panel for flu COVID and RSV negative.  Ammonia 111.  Urinalysis pending.  Patient typed and screened in the ED.  Hydroxybutyric acid mildly elevated at 0.34.  VBG with pH 7.49 and pCO2 30.8.  Chest x-ray showed low lung volumes but no acute abnormality.  CT head showed no acute abnormality did show stable chronic infarct versus chronic prominent perivascular change.  CT of the abdomen pelvis showed mild rectal Farquharson thickening with some fat stranding consistent with proctitis also noted with cirrhosis with collaterals but no ascites.  Patient received octreotide, IV PPI, 500 cc IV fluid in the ED.  GI consulted and agree with PPI/octreotide.   Recommending n.p.o., NG tube, lactulose, anticoagulant reversal.  Review of Systems: Vital signs in ED stable.  Lab workup included CMP with potassium 3.4,  Past Medical History:  Diagnosis Date   Acute renal injury (HCC) 08/06/2016   Aortic atherosclerosis (HCC)    Aortic stenosis    Arthritis    Asthma    BPH (benign prostatic hypertrophy)    Cirrhosis (HCC) 2016   Colon polyps    Diabetes mellitus without complication (HCC)    TYPE 1    Diverticulosis    DKA (diabetic ketoacidosis) (HCC) 10/25/2023   Dysrhythmia 04/19/2021   A-fib noted with possible A-fib RVR   Gastric ulcer    Gastric varices    right   Gastritis    GERD (gastroesophageal reflux disease)    Headache    History- pt states these were migraines that occurred in the 1980's   Heart murmur    History of kidney stones    Hx of adenomatous colonic polyps    Hypertension    Iron deficiency anemia due to chronic blood loss 08/16/2023   Melena 09/20/2023   Portal hypertensive gastropathy (HCC)    Postoperative fever 03/10/2022   Renal cyst, right    Seizures (HCC)    HYPOGLYCEMIC LAST 1 AND 1/2 YRS AGO   Sepsis (HCC) 09/20/2023   Sleep apnea    uses CPAP nightly   Stroke-like symptom 09/20/2023   Testicle trouble    one testicle BORN WITH   Tubular adenoma of colon     Past Surgical History:  Procedure Laterality Date   BACK  SURGERY     89  LOWER    CARPAL TUNNEL RELEASE Right 10/13/2015   Procedure: RIGHT CARPAL TUNNEL RELEASE;  Surgeon: Cindee Salt, MD;  Location: [facility];  Service: Orthopedics;  Laterality: Right;   CARPAL TUNNEL RELEASE Left 07/05/2016   Procedure: LEFT CARPAL TUNNEL RELEASE;  Surgeon: Cindee Salt, MD;  Location: [facility];  Service: Orthopedics;  Laterality: Left;   COLONOSCOPY WITH ESOPHAGOGASTRODUODENOSCOPY (EGD)  10/05/2022   DRUG INDUCED ENDOSCOPY N/A 07/02/2021   Procedure: DRUG INDUCED SLEEP ENDOSCOPY;  Surgeon: Christia Reading, MD;   Location: [facility] OR;  Service: ENT;  Laterality: N/A;   ESOPHAGOGASTRODUODENOSCOPY (EGD) WITH PROPOFOL N/A 09/25/2023   Procedure: ESOPHAGOGASTRODUODENOSCOPY (EGD) WITH PROPOFOL;  Surgeon: Sherrilyn Rist, MD;  Location: [facility] ENDOSCOPY;  Service: Gastroenterology;  Laterality: N/A;  EGD needs to be coordinated with TEE.  Patient has severe aortic stenosis, procedures need to be done to follow each other with 1 sedation   IMPLANTATION OF HYPOGLOSSAL NERVE STIMULATOR Right 10/01/2021   Procedure: IMPLANTATION OF HYPOGLOSSAL NERVE STIMULATOR;  Surgeon: Christia Reading, MD;  Location: [facility] OR;  Service: ENT;  Laterality: Right;   INGUINAL HERNIA REPAIR  2003   right    KNEE ARTHROSCOPY Right 03/03/2016   LUMBAR DISC SURGERY  03/1995   Dr. Daleen Squibb, discectomy   LUMBAR LAMINECTOMY/DECOMPRESSION MICRODISCECTOMY Right 10/06/2016   Procedure: RIGHT LUMBAR THREE - LUMBAR FOUR  LAMINECTOMY, FORAMINOTOMY AND MICRODISCECTOMY;  Surgeon: Shirlean Kelly, MD;  Location: [facility] OR;  Service: Neurosurgery;  Laterality: Right;   SHOULDER SURGERY  11/28/2005   left partial   SHOULDER SURGERY  07/14/2006   RIGHT   TEE WITHOUT CARDIOVERSION N/A 09/25/2023   Procedure: TRANSESOPHAGEAL ECHOCARDIOGRAM (TEE);  Surgeon: Dolores Patty, MD;  Location: [facility] ENDOSCOPY;  Service: Cardiovascular;  Laterality: N/A;   TONSILLECTOMY  AGE 89 OR 4   TOTAL KNEE ARTHROPLASTY Right 03/07/2022   Procedure: RIGHT TOTAL KNEE ARTHROPLASTY;  Surgeon: Gean Birchwood, MD;  Location: [facility] ORS;  Service: Orthopedics;  Laterality: Right;   TOTAL KNEE ARTHROPLASTY Left 06/06/2022   Procedure: LEFT TOTAL KNEE ARTHROPLASTY;  Surgeon: Gean Birchwood, MD;  Location: [facility];  Service: Orthopedics;  Laterality: Left;   TOTAL SHOULDER ARTHROPLASTY Right 11/01/2018   Procedure: RIGHT SHOULDER REVISION TO REVERSE TOTAL SHOULDER;  Surgeon: Jones Broom, MD;  Location: [facility];  Service: Orthopedics;  Laterality: Right;  CHOICE ANESTHESIA WITH INTERSCALENE BLOCK  EXPAREL, NEEDS RNFA    Social History  reports that he quit smoking about 20 years ago. His smoking use included cigarettes and pipe. He started smoking about 54 years ago. He has never used smokeless tobacco. He reports that he does not currently use alcohol. He reports that he does not use drugs.  Allergies  Allergen Reactions   Dimetapp Children's Cold-Cough Other (See Comments)    Chest discomfort    Erythromycin Diarrhea   Sulfonamide Derivatives Diarrhea    Family History  Problem Relation Age of Onset   Heart failure Mother    Heart attack Mother        in her 39's   Alzheimer's disease Mother    Diabetes Mother    Asthma Father    Suicidality Father        in his 50's   Allergic rhinitis Sister    Heart failure Maternal Grandmother    Rheum arthritis Maternal Grandmother    Breast cancer Maternal Grandmother    Heart attack Maternal Grandmother  in her 43's   Colon cancer Neg Hx    Esophageal cancer Neg Hx    Pancreatic cancer Neg Hx    Liver disease Neg Hx   Reviewed on admission  Prior to Admission medications   Medication Sig Start Date End Date Taking? Authorizing Provider  amiodarone (PACERONE) 200 MG tablet 400 mg  po daily from 3/7 to 3/11 200 mg  po daily from 3/12 Patient taking differently: Take 200 mg by mouth daily. 400 mg  po daily from 3/7 to 3/11 200 mg  po daily from 3/12 11/03/23  Yes Osvaldo Shipper, MD  apixaban (ELIQUIS) 5 MG TABS tablet Take 1 tablet (5 mg total) by mouth 2 (two) times daily. 06/08/23  Yes Gottschalk, Kathie Rhodes M, DO  atorvastatin (LIPITOR) 40 MG tablet Take 1 tablet (40 mg total) by mouth daily. Patient taking differently: Take 40 mg by mouth daily at 6 PM. 06/08/23  Yes Delynn Flavin M, DO  b complex vitamins tablet Take 1 tablet by mouth daily.   Yes [provider]  busPIRone (BUSPAR) 5 MG tablet Take 5 mg by mouth 2 (two) times daily.   Yes [provider]  dextromethorphan-guaiFENesin  (ROBITUSSIN-DM) 10-100 MG/5ML liquid Take 10 mLs by mouth in the morning and at bedtime.   Yes [provider]  ergocalciferol (VITAMIN D2) 1.25 MG (50000 UT) capsule Take 50,000 Units by mouth See admin instructions. Take 1 capsule (50,000 units) by mouth twice weekly on Wednesday and Sunday.   Yes [provider]  ferrous sulfate 325 (65 FE) MG EC tablet Take 1 tablet (325 mg total) by mouth daily with breakfast. 01/18/23  Yes Pennington, Rebekah M, PA-C  Glucerna (GLUCERNA) LIQD Take 237 mLs by mouth See admin instructions. Drink 1 bottle 3 times daily, after meals at 1000, 1300 and 1800.   Yes [provider]  insulin glargine (LANTUS SOLOSTAR) 100 UNIT/ML Solostar Pen Inject 24 Units into the skin every 12 (twelve) hours.   Yes [provider]  magnesium oxide (MAG-OX) 400 (240 Mg) MG tablet Take 400 mg by mouth daily.   Yes [provider]  montelukast (SINGULAIR) 10 MG tablet Take 1 tablet (10 mg total) by mouth at bedtime. 06/08/23  Yes Delynn Flavin M, DO  Multiple Vitamins-Minerals (MULTIVITAMIN WITH MINERALS) tablet Take 1 tablet by mouth daily.   Yes [provider]  pantoprazole (PROTONIX) 40 MG tablet TAKE 1 TABLET BY MOUTH TWICE  DAILY BEFORE MEALS 10/16/23  Yes Pyrtle, Carie Caddy, MD  potassium chloride SA (KLOR-CON M) 20 MEQ tablet Take 20 mEq by mouth daily.   Yes [provider]  ramelteon (ROZEREM) 8 MG tablet Take 1 tablet (8 mg total) by mouth at bedtime. 11/03/23  Yes Osvaldo Shipper, MD  acetaminophen (TYLENOL) 500 MG tablet Take 1,000 mg by mouth every 6 (six) hours as needed for moderate pain.    [provider]  amoxicillin-clavulanate (AUGMENTIN) 875-125 MG tablet Take 1 tablet by mouth 2 (two) times daily. Patient not taking: Reported on 12/08/2023    [provider]  Blood Glucose Monitoring Suppl (CONTOUR NEXT ONE) KIT Test BS QID and as needed Dx E11.9 06/04/20   Delynn Flavin M, DO   Continuous Blood Gluc Sensor (FREESTYLE LIBRE SENSOR SYSTEM) MISC Check BS eight (8) times a day. Dx E10.9 06/29/22   Delynn Flavin M, DO  Glucagon, rDNA, (GLUCAGON EMERGENCY) 1 MG KIT INJECT AS DIRECTED INTO  UPPER ARM, THIGH OR  BUTTOCKS AS NEEDED FOR  SEVERE HYPOGLYCEMIA. SEEK  MEDICAL ATTENTION AFTER USE 06/14/21   Gwenlyn Fudge, FNP  glucose blood (CONTOUR NEXT TEST) test strip Test BS QID and as needed Dx E11.9 11/08/21   Delynn Flavin M, DO  insulin aspart (NOVOLOG) 100 UNIT/ML injection Inject 2-15 Units into the skin 4 (four) times daily -  before meals and at bedtime. CBG 121 - 150: 2 units, CBG 151 - 200: 3 units, CBG 201 - 250: 5 units, CBG 251 - 300: 8 units, CBG 301 - 350: 11 units, CBG 351 - 400: 15 units, CBG > 400call MD 09/27/23   Lewie Chamber, MD  insulin aspart (NOVOLOG) 100 UNIT/ML injection Inject 8 Units into the skin 3 (three) times daily before meals.    [provider]  magnesium hydroxide (MILK OF MAGNESIA) 400 MG/5ML suspension Take 30 mLs by mouth daily as needed for mild constipation.    [provider]  metformin (FORTAMET) 1000 MG (OSM) 24 hr tablet Take 1,000 mg by mouth daily with breakfast.    [provider]  Microlet Lancets MISC Test BS QID and as needed Dx E11.9 06/04/20   [name], DO  Testosterone 30 MG/ACT SOLN Apply 1 pump under each axilla once daily. 07/21/23   [name], DO    Physical Exam: Vitals:   12/08/23 1322 12/08/23 1327 12/08/23 1346  BP:  117/71   Pulse:  72   Resp:  13   Temp:   98.9 F (37.2 C)  TempSrc:   Rectal  SpO2:  100%   Height: 5\' 8"  (1.727 m)      Physical Exam Constitutional:      General: He is not in acute distress.    Appearance: Normal appearance.  HENT:     Head: Normocephalic and atraumatic.     Mouth/Throat:     Mouth: Mucous membranes are moist.     Pharynx: Oropharynx is clear.  Eyes:     Extraocular Movements: Extraocular movements intact.      Pupils: Pupils are equal, round, and reactive to light.  Cardiovascular:     Rate and Rhythm: Normal rate and regular rhythm.     Pulses: Normal pulses.     Heart sounds: Normal heart sounds.  Pulmonary:     Effort: Pulmonary effort is normal. No respiratory distress.     Breath sounds: Normal breath sounds.  Abdominal:     General: Bowel sounds are normal. There is no distension.     Palpations: Abdomen is soft.     Tenderness: There is no abdominal tenderness.  Musculoskeletal:        General: No swelling or deformity.  Skin:    General: Skin is warm and dry.  Neurological:     Comments: Drowsy, nods to questions.  Will not open  his eyes.  Seems to deny pain.    Labs on Admission: I have personally reviewed following labs and imaging studies  CBC: Recent Labs  Lab 12/04/23 1216 12/08/23 1332 12/08/23 1406  WBC 4.9 5.8  --   NEUTROABS 3.2 3.9  --   HGB 13.2 10.6* 10.5*  HCT 40.3 32.4* 31.0*  MCV 107.2* 106.2*  --   PLT 149* 161  --     Basic Metabolic Panel: Recent Labs  Lab 12/04/23 1216 12/08/23 1332 12/08/23 1406  NA 136 142 142  K 3.5 3.4* 3.3*  CL 103 110  --   CO2 21* 22  --   GLUCOSE 368* 182*  --  BUN 15 11  --   CREATININE 0.87 0.85  --   CALCIUM 9.3 9.1  --     GFR: CrCl cannot be calculated (Unknown ideal weight.).  Liver Function Tests: Recent Labs  Lab 12/04/23 1216 12/08/23 1332  AST 48* 75*  ALT 42 47*  ALKPHOS 118 90  BILITOT 1.1 0.9  PROT 6.7 5.7*  ALBUMIN 2.7* 2.4*    Urine analysis:    Component Value Date/Time   COLORURINE YELLOW 09/19/2023 2040   APPEARANCEUR CLEAR 09/19/2023 2040   APPEARANCEUR Clear 07/05/2021 1129   LABSPEC 1.015 09/19/2023 2040   PHURINE 6.0 09/19/2023 2040   GLUCOSEU NEGATIVE 09/19/2023 2040   GLUCOSEU NEGATIVE 04/22/2021 1048   HGBUR NEGATIVE 09/19/2023 2040   BILIRUBINUR NEGATIVE 09/19/2023 2040   BILIRUBINUR Negative 07/05/2021 1129   KETONESUR 20 (A) 09/19/2023 2040   PROTEINUR NEGATIVE  09/19/2023 2040   UROBILINOGEN 0.2 04/22/2021 1048   NITRITE NEGATIVE 09/19/2023 2040   LEUKOCYTESUR NEGATIVE 09/19/2023 2040    Radiological Exams on Admission: CT ABDOMEN PELVIS W CONTRAST Result Date: 12/08/2023 CLINICAL DATA:  Abdominal pain, acute, nonlocalized EXAM: CT ABDOMEN AND PELVIS WITH CONTRAST TECHNIQUE: Multidetector CT imaging of the abdomen and pelvis was performed using the standard protocol following bolus administration of intravenous contrast. RADIATION DOSE REDUCTION: This exam was performed according to the departmental dose-optimization program which includes automated exposure control, adjustment of the mA and/or kV according to patient size and/or use of iterative reconstruction technique. CONTRAST:  75mL OMNIPAQUE IOHEXOL 350 MG/ML SOLN COMPARISON:  CT scan abdomen and pelvis from 09/19/2023. FINDINGS: Lower chest: The lung bases are clear. No pleural effusion. The heart is normal in size. No pericardial effusion. Dense mitral annulus and aortic valve calcifications noted. Hepatobiliary: The liver is normal in size. Cirrhotic liver configuration. No focal mass seen within the limitations of this exam. No intrahepatic or extrahepatic bile duct dilation. No calcified gallstones. Normal gallbladder Torgeson thickness. No pericholecystic inflammatory changes. Pancreas: Small/atrophic pancreas.  No discrete focal mass seen. Spleen: Within normal limits. No focal lesion. Adrenals/Urinary Tract: Unremarkable right adrenal gland. Left adrenal gland is partially visualized surrounded by multiple venous collaterals. No discrete focal mass seen. No suspicious renal mass. There are multiple bilateral simple renal cysts. There are at least 3, 2-3 mm nonobstructing calculi in the left kidney and at least 1, 1 mm sized nonobstructing calculus in the right kidney. No ureterolithiasis or obstructive uropathy. Unremarkable urinary bladder. Stomach/Bowel: No disproportionate dilation of the small or  large bowel loops. Unremarkable appendix. There is mild circumferential thickening of the rectum with mild to moderate perirectal fat stranding and trace presacral fluid, favoring proctitis. Correlate clinically. Vascular/Lymphatic: No ascites or pneumoperitoneum. No abdominal or pelvic lymphadenopathy, by size criteria. No aneurysmal dilation of the major abdominal arteries. There are mild peripheral atherosclerotic vascular calcifications of the aorta and its major branches. Reproductive: Normal size prostate. Symmetric seminal vesicles. Other: There is a tiny fat containing umbilical hernia. The soft tissues and abdominal Frett are otherwise unremarkable. Musculoskeletal: No suspicious osseous lesions. There are mild - moderate multilevel degenerative changes in the visualized spine. IMPRESSION: 1. Mild circumferential thickening of the rectum with mild to moderate perirectal fat stranding and trace presacral fluid, favoring proctitis. 2. Otherwise, no acute inflammatory process identified within the abdomen or pelvis. 3. Cirrhotic liver with multiple venous collaterals, suggesting sequela of portal hypertension. No splenomegaly or ascites. 4. Multiple other nonacute observations, as described above. Aortic Atherosclerosis (ICD10-I70.0). Electronically Signed   By: Rhea Belton  Ramiro Harvest M.D.   On: 12/08/2023 16:50   CT HEAD WO CONTRAST ( ) Result Date: 12/08/2023 CLINICAL DATA:  Provided history: Mental status change, persistent or worsening. Altered mental status. On blood thinners. Fatigue. EXAM: CT HEAD WITHOUT CONTRAST TECHNIQUE: Contiguous axial images were obtained from the base of the skull through the vertex without intravenous contrast. RADIATION DOSE REDUCTION: This exam was performed according to the departmental dose-optimization program which includes automated exposure control, adjustment of the mA and/or kV according to patient size and/or use of iterative reconstruction technique. COMPARISON:  Brain  MRI 09/20/2023. Head CT 09/20/2023. FINDINGS: Brain: Generalized parenchymal atrophy. Prominent perivascular space versus chronic lacunar infarct again demonstrated within the right frontal lobe centrum semiovale (series 6, image 22). Mild patchy and ill-defined hypoattenuation elsewhere within the cerebral white matter, nonspecific but compatible with chronic small vessel disease. There is no acute intracranial hemorrhage. No demarcated cortical infarct. No extra-axial fluid collection. No evidence of an intracranial mass. No midline shift. Vascular: No hyperdense vessel.  Atherosclerotic calcifications. Skull: No calvarial fracture or aggressive osseous lesion. Sinuses/Orbits: No mass or acute finding within the imaged orbits. No significant paranasal sinus disease. Other: Surgical fixation hardware along the nasal bones, bilaterally. IMPRESSION: 1. No evidence of an acute intracranial abnormality. 2. Prominent perivascular space versus chronic lacunar infarct within the right frontal lobe centrum semiovale, unchanged. 3. Background mild cerebral white matter chronic small vessel ischemic disease. 4. Generalized parenchymal atrophy. Electronically Signed   By: Jackey Loge D.O.   On: 12/08/2023 16:43   DG Chest Portable 1 View Result Date: 12/08/2023 CLINICAL DATA:  Altered mental status. EXAM: PORTABLE CHEST 1 VIEW COMPARISON:  Chest radiograph dated 10/24/2023. FINDINGS: Stable cardiomediastinal silhouette. Aortic atherosclerosis. Low lung volumes. No focal consolidation, sizeable pleural effusion, or pneumothorax. Stable right chest generator device. Prior reverse right shoulder arthroplasty. No acute osseous abnormality. IMPRESSION: Low lung volumes.  No acute cardiopulmonary findings. Electronically Signed   By: Hart Robinsons M.D.   On: 12/08/2023 15:10   EKG: Independently reviewed.  Sinus rhythm at 75 beats minute.  Right bundle branch block.  Nonspecific T wave changes.  QTc prolonged at 555.   Similar compared to previous with QTc longer than previous.  Assessment/Plan Active Problems:   Asthma   Obesity   OSA (obstructive sleep apnea)   Diabetes type 2, controlled (HCC)   BPH (benign prostatic hyperplasia)   Paroxysmal tachycardia (HCC)   Hypertension associated with type 2 diabetes mellitus (HCC)   Hyperlipidemia associated with type 2 diabetes mellitus (HCC)   GERD with esophagitis   PAF (paroxysmal atrial fibrillation) (HCC)   Grade I diastolic dysfunction   Chronic obstructive pulmonary disease (HCC)   NAFLD (nonalcoholic fatty liver disease)   Portal hypertension (HCC)   Other cirrhosis of liver (HCC)   GI bleed > Patient presenting with altered mental status, black emesis, black stools. > Hemoccult positive in the ED.  Hemoglobin 10.6 down from 12-13.  > Large amount of black/tarry stools in ED. > History of cirrhosis with portal hypertension/varices. > GI consulted and have seen the patient and recommend IV PPI, IV octreotide, Eliquis reversal - Monitor in progressive unit - Appreciate GI recommendations and assistance - Trend hemoglobin every 6 hours overnight - Transfuse for hemoglobin less than 7 or rapid decline - IV PPI - IV octreotide - Eliquis reversal - N.p.o. - Supportive care  Encephalopathy > Presumed hepatic in the setting of cirrhosis and ammonia of 111 with reported increasing somnolence and fatigue. >  Otherwise no evidence of infectious etiology.  > NG tube being placed for lactulose administration in the ED - Appreciate GI recommendations - Continue with lactulose, as unable to tolerate NG can give enema - Supportive care  NAFLD Cirrhosis Portal hypertension Varices > Known history of this.  LFT stable with AST 75, ALT 47, normal T. bili.  Normal platelets. > Concern for upper GI bleed as above possibly variceal. - Address bleed and encephalopathy as above - Not on any diuretics - No ascites on CT  Hypertension - Not on any  antihypertensives  Hyperlipidemia - Resume atorvastatin when tolerating p.o.  Diabetes - SSI  Paroxysmal atrial fibrillation - Resume amiodarone when tolerating p.o. - Eliquis held/reversed as above  GERD - On IV PPI  COPD/asthma - Resume Singulair and tolerating p.o. - Not on inhalers  Chronic diastolic CHF > Last echocardiogram, Was TEE in January 2025 which showed EF 60-65%, normal RV function. > Not on diuretic - Continue to monitor  Obesity OSA - Noted  QTc prolongation > 535 on initial EKG. - Avoid QTc prolonging medications - A.m. EKG  DVT prophylaxis: SCDs Code Status:   Full Family Communication:  None on admission Disposition Plan:   Patient is from:  Rehab facility  Anticipated DC to:  Same as above  Anticipated DC date:  2 to 7 days  Anticipated DC barriers: None  Consults called:  Gastroenterology Admission status:  Inpatient, progressive  Severity of Illness: The appropriate patient status for this patient is INPATIENT. Inpatient status is judged to be reasonable and necessary in order to provide the required intensity of service to ensure the patient's safety. The patient's presenting symptoms, physical exam findings, and initial radiographic and laboratory data in the context of their chronic comorbidities is felt to place them at high risk for further clinical deterioration. Furthermore, it is not anticipated that the patient will be medically stable for discharge from the hospital within 2 midnights of admission.   * I certify that at the point of admission it is my clinical judgment that the patient will require inpatient hospital care spanning beyond 2 midnights from the point of admission due to high intensity of service, high risk for further deterioration and high frequency of surveillance required.Synetta Fail MD [facility]  How to contact the [facility] Attending or Consulting provider 7A - 7P or covering provider during after  hours 7P -7A, for this patient?   Check the care team in [facility] and look for a) attending/consulting [facility] provider listed and b) the [facility] team listed Log into [email or web address] and use [facility]'s universal password to access. If you do not have the password, please contact the hospital operator. Locate the [facility] provider you are looking for under [facility] and page to a number that you can be directly reached. If you still have difficulty reaching the provider, please page the [facility] (Director on Call) for the Hospitalists listed on amion for assistance.  12/08/2023, 5:08 PM

## 2023-12-08 NOTE — ED Provider Notes (Signed)
 [place] EMERGENCY DEPARTMENT AT [facility] Provider Note   CSN: [id number] Arrival date & time: 12/08/23  1316     History  No chief complaint on file.   Tony Cantu is a 73 y.o. male.  The history is provided by medical records, the EMS personnel and the patient. The history is limited by the condition of the patient. No language interpreter was used.  Altered Mental Status Presenting symptoms: partial responsiveness   Presenting symptoms: no confusion   Severity:  Severe Episode history:  Continuous Progression:  Unable to specify Chronicity:  New Associated symptoms: abdominal pain, light-headedness, nausea and vomiting   Associated symptoms: no agitation, no fever, no headaches, no palpitations, no rash and no weakness        Home Medications Prior to Admission medications   Medication Sig Start Date End Date Taking? Authorizing Provider  acetaminophen (TYLENOL) 500 MG tablet Take 1,000 mg by mouth every 6 (six) hours as needed for moderate pain.    [provider]  amiodarone (PACERONE) 200 MG tablet 400 mg  po daily from 3/7 to 3/11 200 mg  po daily from 3/12 11/03/23   Osvaldo Shipper, MD  apixaban (ELIQUIS) 5 MG TABS tablet Take 1 tablet (5 mg total) by mouth 2 (two) times daily. 06/08/23   [name], DO  atorvastatin (LIPITOR) 40 MG tablet Take 1 tablet (40 mg total) by mouth daily. 06/08/23   [name], DO  b complex vitamins tablet Take 1 tablet by mouth daily.    [provider]  Blood Glucose Monitoring Suppl (CONTOUR NEXT ONE) KIT Test BS QID and as needed Dx E11.9 06/04/20   [name], DO  Cholecalciferol (DIALYVITE VITAMIN D 5000) 125 MCG (5000 UT) capsule Take 5,000 Units by mouth daily.    [provider]  cholecalciferol (VITAMIN D3) 25 MCG (1000 UNIT) tablet Take 5,000 Units by mouth daily.    [provider]  Continuous Blood Gluc Sensor (FREESTYLE LIBRE SENSOR SYSTEM) MISC  Check BS eight (8) times a day. Dx E10.9 06/29/22   [name], DO  dextromethorphan-guaiFENesin (ROBITUSSIN-DM) 10-100 MG/5ML liquid Take 10 mLs by mouth in the morning and at bedtime.    [provider]  ferrous sulfate 325 (65 FE) MG EC tablet Take 1 tablet (325 mg total) by mouth daily with breakfast. 01/18/23   Pennington, Rushie Goltz, PA-C  Glucagon, rDNA, (GLUCAGON EMERGENCY) 1 MG KIT INJECT AS DIRECTED INTO  UPPER ARM, THIGH OR  BUTTOCKS AS NEEDED FOR  SEVERE HYPOGLYCEMIA. SEEK  MEDICAL ATTENTION AFTER USE 06/14/21   Gwenlyn Fudge, FNP  glucose blood (CONTOUR NEXT TEST) test strip Test BS QID and as needed Dx E11.9 11/08/21   Delynn Flavin M, DO  insulin aspart (NOVOLOG) 100 UNIT/ML injection Inject 2-15 Units into the skin 4 (four) times daily -  before meals and at bedtime. CBG 121 - 150: 2 units, CBG 151 - 200: 3 units, CBG 201 - 250: 5 units, CBG 251 - 300: 8 units, CBG 301 - 350: 11 units, CBG 351 - 400: 15 units, CBG > 400call MD 09/27/23   Lewie Chamber, MD  insulin aspart (NOVOLOG) 100 UNIT/ML injection Inject 8 Units into the skin 3 (three) times daily before meals.    [provider]  insulin glargine (LANTUS) 100 UNIT/ML injection Inject 0.2 mLs (20 Units total) into the skin 2 (two) times daily. 11/03/23   Osvaldo Shipper, MD  magnesium hydroxide (  MILK OF MAGNESIA) 400 MG/5ML suspension Take 30 mLs by mouth daily as needed for mild constipation.    [provider]  magnesium oxide (MAG-OX) 400 (240 Mg) MG tablet Take 400 mg by mouth daily.    [provider]  metformin (FORTAMET) 1000 MG (OSM) 24 hr tablet Take 1,000 mg by mouth daily with breakfast.    [provider]  Microlet Lancets MISC Test BS QID and as needed Dx E11.9 06/04/20   Delynn Flavin M, DO  montelukast (SINGULAIR) 10 MG tablet Take 1 tablet (10 mg total) by mouth at bedtime. 06/08/23   [name], DO  Multiple Vitamins-Minerals (MULTIVITAMIN WITH  MINERALS) tablet Take 1 tablet by mouth daily.    [provider]  pantoprazole (PROTONIX) 40 MG tablet TAKE 1 TABLET BY MOUTH TWICE  DAILY BEFORE MEALS 10/16/23   Pyrtle, Carie Caddy, MD  ramelteon (ROZEREM) 8 MG tablet Take 1 tablet (8 mg total) by mouth at bedtime. 11/03/23   Osvaldo Shipper, MD  Testosterone 30 MG/ACT SOLN Apply 1 pump under each axilla once daily. 07/21/23   [name], DO      Allergies    Dimetapp children's cold-cough, Erythromycin, and Sulfonamide derivatives    Review of Systems   Review of Systems  Constitutional:  Positive for fatigue. Negative for chills and fever.  HENT:  Negative for congestion.   Respiratory:  Positive for cough. Negative for chest tightness, shortness of breath and wheezing.   Cardiovascular:  Negative for chest pain and palpitations.  Gastrointestinal:  Positive for abdominal pain, blood in stool, nausea and vomiting. Negative for constipation and diarrhea.  Genitourinary:  Negative for dysuria.  Musculoskeletal:  Negative for back pain and neck pain.  Skin:  Negative for rash.  Neurological:  Positive for light-headedness. Negative for weakness and headaches.  Psychiatric/Behavioral:  Negative for agitation and confusion.   All other systems reviewed and are negative.   Physical Exam Updated Vital Signs There were no vitals taken for this visit. Physical Exam Vitals and nursing note reviewed.  Constitutional:      General: He is not in acute distress.    Appearance: He is well-developed. He is ill-appearing. He is not toxic-appearing or diaphoretic.  HENT:     Head: Normocephalic and atraumatic.     Nose: No congestion or rhinorrhea.     Mouth/Throat:     Mouth: Mucous membranes are dry.     Pharynx: No oropharyngeal exudate or posterior oropharyngeal erythema.  Eyes:     Conjunctiva/sclera: Conjunctivae normal.     Pupils: Pupils are equal, round, and reactive to light.  Cardiovascular:     Rate and Rhythm:  Normal rate and regular rhythm.     Pulses: Normal pulses.     Heart sounds: Murmur heard.  Pulmonary:     Effort: Pulmonary effort is normal. No respiratory distress.     Breath sounds: Normal breath sounds. No wheezing, rhonchi or rales.  Chest:     Chest Salceda: No tenderness.  Abdominal:     General: Abdomen is flat.     Palpations: Abdomen is soft.     Tenderness: There is abdominal tenderness.  Musculoskeletal:        General: No swelling or signs of injury.     Cervical back: Neck supple. No tenderness.  Skin:    General: Skin is warm and dry.     Capillary Refill: Capillary refill takes less than 2 seconds.  Coloration: Skin is pale.     Findings: No erythema or rash.  Neurological:     Mental Status: He is alert.     Motor: No weakness.     ED Results / Procedures / Treatments   Labs (all labs ordered are listed, but only abnormal results are displayed) Labs Reviewed  AMMONIA - Abnormal; Notable for the following components:      Result Value   Ammonia 111 (*)    All other components within normal limits  CBC WITH DIFFERENTIAL/PLATELET - Abnormal; Notable for the following components:   RBC 3.05 (*)    Hemoglobin 10.6 (*)    HCT 32.4 (*)    MCV 106.2 (*)    MCH 34.8 (*)    RDW 17.3 (*)    All other components within normal limits  COMPREHENSIVE METABOLIC PANEL WITH GFR - Abnormal; Notable for the following components:   Potassium 3.4 (*)    Glucose, Bld 182 (*)    Total Protein 5.7 (*)    Albumin 2.4 (*)    AST 75 (*)    ALT 47 (*)    All other components within normal limits  BETA-HYDROXYBUTYRIC ACID - Abnormal; Notable for the following components:   Beta-Hydroxybutyric Acid 0.34 (*)    All other components within normal limits  I-STAT VENOUS BLOOD GAS, ED - Abnormal; Notable for the following components:   pH, Ven 7.499 (*)    pCO2, Ven 30.8 (*)    pO2, Ven 113 (*)    Potassium 3.3 (*)    HCT 31.0 (*)    Hemoglobin 10.5 (*)    All other  components within normal limits  POC OCCULT BLOOD, ED - Abnormal; Notable for the following components:   Fecal Occult Bld POSITIVE (*)    All other components within normal limits  RESP PANEL BY RT-PCR (RSV, FLU A&B, COVID)  RVPGX2  LIPASE, BLOOD  TSH  URINALYSIS, W/ REFLEX TO CULTURE (INFECTION SUSPECTED)  I-STAT CG4 LACTIC ACID, ED  I-STAT CG4 LACTIC ACID, ED  TYPE AND SCREEN    EKG EKG Interpretation Date/Time:  Friday December 08 2023 13:28:35 EDT Ventricular Rate:  75 PR Interval:  153 QRS Duration:  157 QT Interval:  496 QTC Calculation: 555 R Axis:   -55  Text Interpretation: Sinus rhythm RBBB and LAFB Probable LVH with secondary repol abnrm when compared to prior, similar appearance with longer QTc No STEMI Confirmed by Theda Belfast ([id number]) on 12/08/2023 1:41:54 PM  Radiology DG Chest Portable 1 View Result Date: 12/08/2023 CLINICAL DATA:  Altered mental status. EXAM: PORTABLE CHEST 1 VIEW COMPARISON:  Chest radiograph dated 10/24/2023. FINDINGS: Stable cardiomediastinal silhouette. Aortic atherosclerosis. Low lung volumes. No focal consolidation, sizeable pleural effusion, or pneumothorax. Stable right chest generator device. Prior reverse right shoulder arthroplasty. No acute osseous abnormality. IMPRESSION: Low lung volumes.  No acute cardiopulmonary findings. Electronically Signed   By: Hart Robinsons M.D.   On: 12/08/2023 15:10    Procedures Procedures    CRITICAL CARE Performed by: Canary Brim Terryann Verbeek Total critical care time: 35 minutes Critical care time was exclusive of separately billable procedures and treating other patients. Critical care was necessary to treat or prevent imminent or life-threatening deterioration. Critical care was time spent personally by me on the following activities: development of treatment plan with patient and/or surrogate as well as nursing, discussions with consultants, evaluation of patient's response to treatment, examination  of patient, obtaining history from patient or surrogate, ordering and performing  treatments and interventions, ordering and review of laboratory studies, ordering and review of radiographic studies, pulse oximetry and re-evaluation of patient's condition.  Medications Ordered in ED Medications  pantoprazole (PROTONIX) injection 40 mg (has no administration in time range)  octreotide (SANDOSTATIN) 2 mcg/mL load via infusion 50 mcg (has no administration in time range)    And  octreotide (SANDOSTATIN) 500 mcg in sodium chloride 0.9 % 250 mL (2 mcg/mL) infusion (has no administration in time range)  sodium chloride 0.9 % bolus 500 mL (500 mLs Intravenous New Bag/Given 12/08/23 1452)  iohexol (OMNIPAQUE) 350 MG/ML injection 75 mL (75 mLs Intravenous Contrast Given 12/08/23 1607)    ED Course/ Medical Decision Making/ A&P                                 Medical Decision Making Amount and/or Complexity of Data Reviewed Labs: ordered. Radiology: ordered.  Risk Prescription drug management. Decision regarding hospitalization.    Tony Cantu is a 73 y.o. male with a past medical history significant for kidney stones, previous seizures, diabetes with previous DKA, atherosclerosis, atrial fibrillation on Eliquis therapy, cirrhosis with esophageal varices, hyperlipidemia, and Parkinson's who presents with altered mental status nausea, vomiting, black stools, abdominal pain, fatigue, and cough.  According to EMS report, patient is a Writer facility after a fall while back.  The last day or 2 patient has not been acting like himself and is very somnolent and fatigue.  He has had black emesis around his mouth and has had dark black tarry stools.  He is nodding yes and no to answer questions and is not speaking otherwise.  He is reporting abdominal pain and some cough.  He is denying chest pain or shortness of breath.  There was no new falls per EMS or the patient.  On my exam, patient will not open his  eyes but will continue to nod yes and no answers.  Pupils were 3 mm and reactive bilaterally.  He has dried black emesis around his mouth.  Chest was nontender.  He does have a murmur.  Abdomen was diffusely tender but I did hear some bowel sounds.  Patient is somnolent but will move extremities.  Given the patient's report of liver disease and cirrhosis with varices and black vomit and black stools, concerned about GI bleed.  Will get a type and screen.  Will give some fluids.  Will get a CT of the head given his reported fall on blood thinners in the past and altered mental status.  Will get a CT abdomen pelvis to look for abnormality given the tenderness on exam.  Will get a chest x-ray with his cough.  Will get screening labs and due to the altered mental status we will get an ammonia level as well.  Will look for other causes of symptoms and get a viral swab given his reported cough.  Anticipate admission after workup is completed given his altered mental status from his baseline.     3:23 PM Workup continues to return.  He is negative for COVID/flu/RSV.  His ammonia is quite elevated at 111 compared from 30 last month.  His metabolic panel shows elevation in his AST and ALT slightly and his CBC shows a drop in hemoglobin down to 10.6 from 13.24 days ago.  He does not appear to be in DKA as he is not acidotic.  Fecal occult test was positive.  Lactic  acid is normal.  Lipase is normal.  Will get the CT scan as initially planned of both the head and the abdomen pelvis however we will call GI given his hepatic encephalopathy and suspected GI bleed in the setting of liver disease and dropping hemoglobins.  Anticipate they will want antibiotics and possibly Protonix or octreotide.  Anticipate medicine admission after workup is completed.  4:12 PM Spoke to GI who recommended Protonix, octreotide, they will see patient and they recommend admission to medicine for hepatic encephalopathy and suspected upper  GI bleed causing altered mental status and drop in hemoglobin.  Will admit to medicine for further management.  Vital signs are stable at this time so do not feel needs ICU currently.         Final Clinical Impression(s) / ED Diagnoses Final diagnoses:  Altered mental status, unspecified altered mental status type  Hepatic encephalopathy (HCC)  Gastrointestinal hemorrhage, unspecified gastrointestinal hemorrhage type  Abdominal pain, unspecified abdominal location      Clinical Impression: 1. Altered mental status, unspecified altered mental status type   2. Hepatic encephalopathy (HCC)   3. Gastrointestinal hemorrhage, unspecified gastrointestinal hemorrhage type   4. Abdominal pain, unspecified abdominal location     Disposition: Admit  This note was prepared with assistance of Dragon voice recognition software. Occasional wrong-word or sound-a-like substitutions may have occurred due to the inherent limitations of voice recognition software.     Jonnell Hentges, Canary Brim, MD 12/08/23 [phone]

## 2023-12-09 ENCOUNTER — Other Ambulatory Visit: Payer: Self-pay

## 2023-12-09 DIAGNOSIS — K7581 Nonalcoholic steatohepatitis (NASH): Secondary | ICD-10-CM | POA: Diagnosis not present

## 2023-12-09 DIAGNOSIS — K920 Hematemesis: Secondary | ICD-10-CM | POA: Diagnosis not present

## 2023-12-09 DIAGNOSIS — D62 Acute posthemorrhagic anemia: Secondary | ICD-10-CM

## 2023-12-09 DIAGNOSIS — K921 Melena: Secondary | ICD-10-CM | POA: Diagnosis not present

## 2023-12-09 DIAGNOSIS — K922 Gastrointestinal hemorrhage, unspecified: Secondary | ICD-10-CM | POA: Diagnosis not present

## 2023-12-09 DIAGNOSIS — K31819 Angiodysplasia of stomach and duodenum without bleeding: Secondary | ICD-10-CM

## 2023-12-09 DIAGNOSIS — K7682 Hepatic encephalopathy: Secondary | ICD-10-CM

## 2023-12-09 DIAGNOSIS — Z7901 Long term (current) use of anticoagulants: Secondary | ICD-10-CM

## 2023-12-09 DIAGNOSIS — K7290 Hepatic failure, unspecified without coma: Secondary | ICD-10-CM

## 2023-12-09 DIAGNOSIS — K7460 Unspecified cirrhosis of liver: Secondary | ICD-10-CM | POA: Diagnosis not present

## 2023-12-09 DIAGNOSIS — K3189 Other diseases of stomach and duodenum: Secondary | ICD-10-CM

## 2023-12-09 LAB — CBC
HCT: 31.1 % — ABNORMAL LOW (ref 39.0–52.0)
HCT: 31.6 % — ABNORMAL LOW (ref 39.0–52.0)
Hemoglobin: 10.5 g/dL — ABNORMAL LOW (ref 13.0–17.0)
Hemoglobin: 10.6 g/dL — ABNORMAL LOW (ref 13.0–17.0)
MCH: 35 pg — ABNORMAL HIGH (ref 26.0–34.0)
MCH: 35 pg — ABNORMAL HIGH (ref 26.0–34.0)
MCHC: 33.8 g/dL (ref 30.0–36.0)
MCHC: 33.5 g/dL (ref 30.0–36.0)
MCV: 103.7 fL — ABNORMAL HIGH (ref 80.0–100.0)
MCV: 104.3 fL — ABNORMAL HIGH (ref 80.0–100.0)
Platelets: 145 10*3/uL — ABNORMAL LOW (ref 150–400)
Platelets: 161 10*3/uL (ref 150–400)
RBC: 3 MIL/uL — ABNORMAL LOW (ref 4.22–5.81)
RBC: 3.03 MIL/uL — ABNORMAL LOW (ref 4.22–5.81)
RDW: 17.3 % — ABNORMAL HIGH (ref 11.5–15.5)
RDW: 17.3 % — ABNORMAL HIGH (ref 11.5–15.5)
WBC: 4.7 10*3/uL (ref 4.0–10.5)
WBC: 5.7 10*3/uL (ref 4.0–10.5)
nRBC: 0 % (ref 0.0–0.2)
nRBC: 0 % (ref 0.0–0.2)

## 2023-12-09 LAB — COMPREHENSIVE METABOLIC PANEL WITH GFR
ALT: 45 U/L — ABNORMAL HIGH (ref 0–44)
AST: 64 U/L — ABNORMAL HIGH (ref 15–41)
Albumin: 2.4 g/dL — ABNORMAL LOW (ref 3.5–5.0)
Alkaline Phosphatase: 90 U/L (ref 38–126)
Anion gap: 10 (ref 5–15)
BUN: 8 mg/dL (ref 8–23)
CO2: 22 mmol/L (ref 22–32)
Calcium: 9.3 mg/dL (ref 8.9–10.3)
Chloride: 112 mmol/L — ABNORMAL HIGH (ref 98–111)
Creatinine, Ser: 0.85 mg/dL (ref 0.61–1.24)
GFR, Estimated: >60 mL/min (ref >60)
Glucose, Bld: 117 mg/dL — ABNORMAL HIGH (ref 70–99)
Potassium: 3.1 mmol/L — ABNORMAL LOW (ref 3.5–5.1)
Sodium: 144 mmol/L (ref 135–145)
Total Bilirubin: 1.1 mg/dL (ref 0.0–1.2)
Total Protein: 5.6 g/dL — ABNORMAL LOW (ref 6.5–8.1)

## 2023-12-09 LAB — GLUCOSE, CAPILLARY
Glucose-Capillary: 128 mg/dL — ABNORMAL HIGH (ref 70–99)
Glucose-Capillary: 135 mg/dL — ABNORMAL HIGH (ref 70–99)
Glucose-Capillary: 173 mg/dL — ABNORMAL HIGH (ref 70–99)
Glucose-Capillary: 216 mg/dL — ABNORMAL HIGH (ref 70–99)
Glucose-Capillary: 239 mg/dL — ABNORMAL HIGH (ref 70–99)
Glucose-Capillary: 296 mg/dL — ABNORMAL HIGH (ref 70–99)
Glucose-Capillary: 325 mg/dL — ABNORMAL HIGH (ref 70–99)
Glucose-Capillary: 47 mg/dL — ABNORMAL LOW (ref 70–99)

## 2023-12-09 LAB — MAGNESIUM: Magnesium: 1.8 mg/dL (ref 1.7–2.4)

## 2023-12-09 LAB — BRAIN NATRIURETIC PEPTIDE: B Natriuretic Peptide: 420.3 pg/mL — ABNORMAL HIGH (ref 0.0–100.0)

## 2023-12-09 LAB — AMMONIA: Ammonia: 154 umol/L — ABNORMAL HIGH (ref 9–35)

## 2023-12-09 LAB — MRSA NEXT GEN BY PCR, NASAL: MRSA by PCR Next Gen: NOT DETECTED

## 2023-12-09 MED ORDER — LACTULOSE ENEMA
300.0000 mL | Freq: Once | ORAL | Status: AC
  Administered 2023-12-09: 300 mL via RECTAL
  Filled 2023-12-09: qty 300

## 2023-12-09 MED ORDER — DEXTROSE IN LACTATED RINGERS 5 % IV SOLN: INTRAVENOUS | Status: DC

## 2023-12-09 MED ORDER — POTASSIUM CHLORIDE 10 MEQ/100ML IV SOLN
10.0000 meq | INTRAVENOUS | Status: AC
  Administered 2023-12-09 (×4): 10 meq via INTRAVENOUS
  Filled 2023-12-09 (×4): qty 100

## 2023-12-09 MED ORDER — ACETAMINOPHEN 325 MG PO TABS
650.0000 mg | ORAL_TABLET | Freq: Four times a day (QID) | ORAL | Status: DC | PRN
  Administered 2023-12-24 – 2023-12-28 (×2): 650 mg
  Filled 2023-12-09 (×2): qty 2

## 2023-12-09 MED ORDER — PANTOPRAZOLE SODIUM 40 MG IV SOLR
40.0000 mg | Freq: Two times a day (BID) | INTRAVENOUS | Status: DC
  Administered 2023-12-09 – 2023-12-17 (×17): 40 mg via INTRAVENOUS
  Filled 2023-12-09 (×17): qty 10

## 2023-12-09 MED ORDER — MAGNESIUM SULFATE 2 GM/50ML IV SOLN
2.0000 g | Freq: Once | INTRAVENOUS | Status: AC
Start: 2023-12-09 — End: 2023-12-09
  Administered 2023-12-09: 2 g via INTRAVENOUS
  Filled 2023-12-09: qty 50

## 2023-12-09 MED ORDER — RIFAXIMIN 550 MG PO TABS
550.0000 mg | ORAL_TABLET | Freq: Two times a day (BID) | ORAL | Status: DC
  Administered 2023-12-09 – 2023-12-10 (×3): 550 mg
  Filled 2023-12-09 (×3): qty 1

## 2023-12-09 MED ORDER — INSULIN ASPART 100 UNIT/ML IJ SOLN
0.0000 [IU] | Freq: Three times a day (TID) | INTRAMUSCULAR | Status: DC
  Administered 2023-12-09: 7 [IU] via SUBCUTANEOUS
  Administered 2023-12-09: 5 [IU] via SUBCUTANEOUS

## 2023-12-09 MED ORDER — LACTULOSE 10 GM/15ML PO SOLN
30.0000 g | Freq: Three times a day (TID) | ORAL | Status: DC
  Administered 2023-12-09: 30 g via ORAL
  Filled 2023-12-09: qty 60

## 2023-12-09 MED ORDER — LACTULOSE 10 GM/15ML PO SOLN
30.0000 g | Freq: Three times a day (TID) | ORAL | Status: AC
  Administered 2023-12-09: 30 g
  Filled 2023-12-09: qty 60

## 2023-12-09 MED ORDER — ACETAMINOPHEN 650 MG RE SUPP: 650.0000 mg | Freq: Four times a day (QID) | RECTAL | Status: DC | PRN

## 2023-12-09 MED ORDER — DILTIAZEM HCL 25 MG/5ML IV SOLN: 10.0000 mg | Freq: Four times a day (QID) | INTRAVENOUS | Status: DC | PRN

## 2023-12-09 NOTE — Progress Notes (Signed)
 PROGRESS NOTE                                                                                                                                                                                                             Patient Demographics:    Tony Cantu, is a 73 y.o. male, DOB - 08/30/1950, AVW:[id number]  Outpatient Primary MD for the patient is [name], DO    LOS - 1  Admit date - 12/08/2023    Chief Complaint  Patient presents with   Fatigue       Brief Narrative (HPI from H&P)    73 y.o. male with medical history significant of hypertension, hyperlipidemia, diabetes, paroxysmal A-fib, paroxysmal tachycardia, GERD, COPD/asthma, chronic diastolic CHF, NAFLD, cirrhosis, portal hypertension, obesity, OSA, BPH, presenting with altered mental status and nausea vomiting and dark stool.   History team with assistance of chart review and EMS report.  Patient reportedly at facility after fall.  Patient has been acting differently for the past 1 to 2 days.  He has had increased fatigue and somnolence.  Reportedly has had nausea and vomiting with black emesis and now black stools.  He will nod to answer questions but has not been speaking.  Further workup was consistent with severe hepatic encephalopathy and possible upper GI bleed, GI was consulted and he was admitted.   Subjective:    Margorie Shelter today in bed unresponsive but in no distress   Assessment  & Plan :   UGI bleed in a patient with  [name], cirrhosis, portal hypertension and gastric varices >  History of cirrhosis with portal hypertension/varices, with melena as the presenting symptom. > GI consulted and have seen the patient and recommend IV PPI, IV octreotide, Eliquis reversed in the ER, has NG tube n.p.o., continue to monitor CBC.  Transfuse as needed.   Severe hepatic encephalopathy with underlying history of [place]  > NG tube in, lactulose via NG tube  along with Xifaxan, 1 lactulose enema.  Continue to monitor   Hypertension - Not on any antihypertensives   Hyperlipidemia - Resume atorvastatin when tolerating p.o.   Paroxysmal atrial fibrillation - Resume amiodarone when tolerating p.o. with underlying history of [name] will defer it to his primary cardiologist on long-term use of amiodarone. - Eliquis held/reversed as above.  As needed IV Cardizem  GERD - On IV PPI   COPD/asthma - Resume Singulair and tolerating p.o. - Not on inhalers, no acute issues   Chronic diastolic CHF > Last echocardiogram, Was TEE in January 2025 which showed EF 60-65%, normal RV function. > Not on diuretic - Continue to monitor   QTc prolongation > 535 on initial EKG. -replace electrolytes repeat EKG on 12/10/2023.   Diabetes -D5W while n.p.o., SSI if needed  Lab Results  Component Value Date   HGBA1C 4.4 (L) 09/19/2023   CBG (last 3)  Recent Labs    12/09/23 0017 12/09/23 0350 12/09/23 0809  GLUCAP 173* 128* 135*           Condition - Extremely Guarded  Family Communication  : Ex-wife and primary decision maker is Tony Cantu [phone] in detail on 12/09/2023  Code Status :  Full  Consults  :  GI  PUD Prophylaxis : PPI   Procedures  :     CT head nonacute.    CT   abdomen pelvis.   1. Mild circumferential thickening of the rectum with mild to moderate perirectal fat stranding and trace presacral fluid, favoring proctitis. 2. Otherwise, no acute inflammatory process identified within the abdomen or pelvis. 3. Cirrhotic liver with multiple venous collaterals, suggesting sequela of portal hypertension. No splenomegaly or ascites. 4. Multiple other nonacute observations, as described above. Aortic Atherosclerosis (ICD10-I70.0).      Disposition Plan  :    Status is: Inpatient  DVT Prophylaxis  :    Place and maintain sequential compression device Start: 12/09/23 0555 SCDs Start: 12/08/23 1707    Lab Results  Component  Value Date   PLT 161 12/09/2023    Diet :  Diet Order             Diet NPO time specified  Diet effective now                    Inpatient Medications  Scheduled Meds:  insulin aspart  0-9 Units Subcutaneous Q8H   lactulose  30 g Oral TID   lactulose  300 mL Rectal Once   pantoprazole (PROTONIX) IV  40 mg Intravenous Once   rifaximin  550 mg Per Tube BID   sodium chloride flush  3 mL Intravenous Q12H   Continuous Infusions:  dextrose 5% lactated ringers     potassium chloride     PRN Meds:.acetaminophen **OR** acetaminophen  Antibiotics  :    Anti-infectives (From admission, onward)    Start     Dose/Rate Route Frequency Ordered Stop   12/09/23 1000  rifaximin (XIFAXAN) tablet 550 mg        550 mg Per Tube 2 times daily 12/09/23 0827           Objective:   Vitals:   12/08/23 2000 12/08/23 2340 12/09/23 0400 12/09/23 0800  BP: 128/71 102/66 108/71 124/69  Pulse: 70  71 72  Resp: 14  18 20   Temp: 97.9 F (36.6 C) 98.4 F (36.9 C) 98.1 F (36.7 C) 97.8 F (36.6 C)  TempSrc: Axillary Oral Axillary Oral  SpO2: 98% 96% 97% 94%  Height: 5' 7.99" (1.727 m)       Wt Readings from Last 3 Encounters:  11/14/23 98.4 kg  11/03/23 85.2 kg  09/20/23 92.4 kg     Intake/Output Summary (Last 24 hours) at 12/09/2023 0828 Last data filed at 12/08/2023 2300 Gross per 24 hour  Intake --  Output 850 ml  Net -850  ml     Physical Exam  Somnolent, completely confused, NG tube in place, no focal deficits [place].AT,PERRAL Supple Neck, No JVD,   Symmetrical Chest [place] movement, Good air movement bilaterally, CTAB RRR,No Gallops,Rubs or new Murmurs,  +ve B.Sounds, Abd Soft, No tenderness,   No Cyanosis, Clubbing or edema      Data Review:    Recent Labs  Lab 12/04/23 1216 12/08/23 1332 12/08/23 1406 12/08/23 1843 12/09/23 0038 12/09/23 0648  WBC 4.9 5.8  --  5.3 4.7 5.7  HGB 13.2 10.6* 10.5* 10.8* 10.5* 10.6*  HCT 40.3 32.4* 31.0* 32.0* 31.1* 31.6*   PLT 149* 161  --  145* 145* 161  MCV 107.2* 106.2*  --  104.6* 103.7* 104.3*  MCH 35.1* 34.8*  --  35.3* 35.0* 35.0*  MCHC 32.8 32.7  --  33.8 33.8 33.5  RDW 17.0* 17.3*  --  17.2* 17.3* 17.3*  LYMPHSABS 1.0 1.1  --   --   --   --   MONOABS 0.6 0.7  --   --   --   --   EOSABS 0.1 0.0  --   --   --   --   BASOSABS 0.1 0.0  --   --   --   --     Recent Labs  Lab 12/04/23 1216 12/08/23 1332 12/08/23 1406 12/08/23 1705 12/08/23 1843 12/09/23 0648  NA 136 142 142  --   --  144  K 3.5 3.4* 3.3*  --   --  3.1*  CL 103 110  --   --   --  112*  CO2 21* 22  --   --   --  22  ANIONGAP 12 10  --   --   --  10  GLUCOSE 368* 182*  --   --   --  117*  BUN 15 11  --   --   --  8  CREATININE 0.87 0.85  --   --   --  0.85  AST 48* 75*  --   --   --  64*  ALT 42 47*  --   --   --  45*  ALKPHOS 118 90  --   --   --  90  BILITOT 1.1 0.9  --   --   --  1.1  ALBUMIN 2.7* 2.4*  --   --   --  2.4*  LATICACIDVEN  --   --  1.6  --   --   --   INR  --   --   --  1.9*  --   --   TSH  --   --   --   --  1.469  --   AMMONIA  --  111*  --   --   --  154*  BNP  --   --   --   --   --  420.3*  MG  --   --   --   --   --  1.8  CALCIUM 9.3 9.1  --   --   --  9.3      Recent Labs  Lab 12/04/23 1216 12/08/23 1332 12/08/23 1406 12/08/23 1705 12/08/23 1843 12/09/23 0648  LATICACIDVEN  --   --  1.6  --   --   --   INR  --   --   --  1.9*  --   --   TSH  --   --   --   --  1.469  --   AMMONIA  --  111*  --   --   --  154*  BNP  --   --   --   --   --  420.3*  MG  --   --   --   --   --  1.8  CALCIUM 9.3 9.1  --   --   --  9.3    --------------------------------------------------------------------------------------------------------------- Lab Results  Component Value Date   CHOL 126 01/04/2022   HDL 57 01/04/2022   LDLCALC 55 01/04/2022   TRIG 67 01/04/2022   CHOLHDL 2.2 01/04/2022    Lab Results  Component Value Date   HGBA1C 4.4 (L) 09/19/2023   Recent Labs    12/08/23 1843  TSH  1.469     Radiology Report DG Abd 1 View Result Date: 12/08/2023 CLINICAL DATA:  NG tube placement EXAM: ABDOMEN - 1 VIEW COMPARISON:  None Available. FINDINGS: NG tube is in the proximal to mid stomach. Nonobstructive bowel gas pattern. IMPRESSION: NG tube in the stomach. Electronically Signed   By: Janeece Mechanic M.D.   On: 12/08/2023 23:14   CT ABDOMEN PELVIS W CONTRAST Result Date: 12/08/2023 CLINICAL DATA:  Abdominal pain, acute, nonlocalized EXAM: CT ABDOMEN AND PELVIS WITH CONTRAST TECHNIQUE: Multidetector CT imaging of the abdomen and pelvis was performed using the standard protocol following bolus administration of intravenous contrast. RADIATION DOSE REDUCTION: This exam was performed according to the departmental dose-optimization program which includes automated exposure control, adjustment of the mA and/or kV according to patient size and/or use of iterative reconstruction technique. CONTRAST:  75mL OMNIPAQUE IOHEXOL 350 MG/ML SOLN COMPARISON:  CT scan abdomen and pelvis from 09/19/2023. FINDINGS: Lower chest: The lung bases are clear. No pleural effusion. The heart is normal in size. No pericardial effusion. Dense mitral annulus and aortic valve calcifications noted. Hepatobiliary: The liver is normal in size. Cirrhotic liver configuration. No focal mass seen within the limitations of this exam. No intrahepatic or extrahepatic bile duct dilation. No calcified gallstones. Normal gallbladder Helfrich thickness. No pericholecystic inflammatory changes. Pancreas: Small/atrophic pancreas.  No discrete focal mass seen. Spleen: Within normal limits. No focal lesion. Adrenals/Urinary Tract: Unremarkable right adrenal gland. Left adrenal gland is partially visualized surrounded by multiple venous collaterals. No discrete focal mass seen. No suspicious renal mass. There are multiple bilateral simple renal cysts. There are at least 3, 2-3 mm nonobstructing calculi in the left kidney and at least 1, 1 mm sized  nonobstructing calculus in the right kidney. No ureterolithiasis or obstructive uropathy. Unremarkable urinary bladder. Stomach/Bowel: No disproportionate dilation of the small or large bowel loops. Unremarkable appendix. There is mild circumferential thickening of the rectum with mild to moderate perirectal fat stranding and trace presacral fluid, favoring proctitis. Correlate clinically. Vascular/Lymphatic: No ascites or pneumoperitoneum. No abdominal or pelvic lymphadenopathy, by size criteria. No aneurysmal dilation of the major abdominal arteries. There are mild peripheral atherosclerotic vascular calcifications of the aorta and its major branches. Reproductive: Normal size prostate. Symmetric seminal vesicles. Other: There is a tiny fat containing umbilical hernia. The soft tissues and abdominal [place] are otherwise unremarkable. Musculoskeletal: No suspicious osseous lesions. There are mild - moderate multilevel degenerative changes in the visualized spine. IMPRESSION: 1. Mild circumferential thickening of the rectum with mild to moderate perirectal fat stranding and trace presacral fluid, favoring proctitis. 2. Otherwise, no acute inflammatory process identified within the abdomen or pelvis. 3. Cirrhotic liver with multiple venous collaterals, suggesting sequela of portal hypertension. No  splenomegaly or ascites. 4. Multiple other nonacute observations, as described above. Aortic Atherosclerosis (ICD10-I70.0). Electronically Signed   By: Beula Brunswick M.D.   On: 12/08/2023 16:50   CT HEAD WO CONTRAST ( ) Result Date: 12/08/2023 CLINICAL DATA:  Provided history: Mental status change, persistent or worsening. Altered mental status. On blood thinners. Fatigue. EXAM: CT HEAD WITHOUT CONTRAST TECHNIQUE: Contiguous axial images were obtained from the base of the skull through the vertex without intravenous contrast. RADIATION DOSE REDUCTION: This exam was performed according to the departmental  dose-optimization program which includes automated exposure control, adjustment of the mA and/or kV according to patient size and/or use of iterative reconstruction technique. COMPARISON:  Brain MRI 09/20/2023. Head CT 09/20/2023. FINDINGS: Brain: Generalized parenchymal atrophy. Prominent perivascular space versus chronic lacunar infarct again demonstrated within the right frontal lobe centrum semiovale (series 6, image 22). Mild patchy and ill-defined hypoattenuation elsewhere within the cerebral white matter, nonspecific but compatible with chronic small vessel disease. There is no acute intracranial hemorrhage. No demarcated cortical infarct. No extra-axial fluid collection. No evidence of an intracranial mass. No midline shift. Vascular: No hyperdense vessel.  Atherosclerotic calcifications. Skull: No calvarial fracture or aggressive osseous lesion. Sinuses/Orbits: No mass or acute finding within the imaged orbits. No significant paranasal sinus disease. Other: Surgical fixation hardware along the nasal bones, bilaterally. IMPRESSION: 1. No evidence of an acute intracranial abnormality. 2. Prominent perivascular space versus chronic lacunar infarct within the right frontal lobe centrum semiovale, unchanged. 3. Background mild cerebral white matter chronic small vessel ischemic disease. 4. Generalized parenchymal atrophy. Electronically Signed   By: Bascom Lily D.O.   On: 12/08/2023 16:43   DG Chest Portable 1 View Result Date: 12/08/2023 CLINICAL DATA:  Altered mental status. EXAM: PORTABLE CHEST 1 VIEW COMPARISON:  Chest radiograph dated 10/24/2023. FINDINGS: Stable cardiomediastinal silhouette. Aortic atherosclerosis. Low lung volumes. No focal consolidation, sizeable pleural effusion, or pneumothorax. Stable right chest generator device. Prior reverse right shoulder arthroplasty. No acute osseous abnormality. IMPRESSION: Low lung volumes.  No acute cardiopulmonary findings. Electronically Signed   By:  Mannie Seek M.D.   On: 12/08/2023 15:10     Signature  -   Lynnwood Sauer M.D on 12/09/2023 at 8:28 AM   -  To page go to [email or web address]

## 2023-12-09 NOTE — Evaluation (Signed)
 Physical Therapy Evaluation Patient Details Name: Tony Cantu MRN: [id number] DOB: 1950-12-27 Today's Date: 12/09/2023  History of Present Illness  Patient is 73 yo male admitted on 12/08/23 for AMS, nausea, vomiting, and dark stool. PMHx significant for type 1 diabetes, A-fib on Eliquis, cirrhosis, seizure disorder, aortic stenosis, HTN, peptic ulcer disease, OA with bilateral knee replacement, polyclonal gammopathy, OSA on CPAP, GERD, COPD/asthma, chronic diastolic CHF, cirrhosis, portal hypertension, and BPH.  Clinical Impression  Prior to current admission, patient was receiving rehab at skilled nursing facility. Patient presents today with lethargy and demonstrates poor historian. Patient opened eyes once with tactile cues but communicated by nodding yes/no throughout session. Attempted to verbally communicate several times but very difficult to understand. Patient presents with generalized weakness B LE however demonstrates [facility] AAROM. Requires total assist for R/L rolling with poor ability for sequencing and initiating functional tasks. Patient will benefit from skilled acute PT services to address the above impairments. Recommend post-acute rehab < 3 hours/day.       If plan is discharge home, recommend the following: Two people to help with walking and/or transfers;A lot of help with bathing/dressing/bathroom;Assistance with cooking/housework;Direct supervision/assist for medications management;Direct supervision/assist for financial management;Assist for transportation;Supervision due to cognitive status   Can travel by private vehicle   No    Equipment Recommendations None recommended by PT  Recommendations for Other Services       Functional Status Assessment Patient has had a recent decline in their functional status and/or demonstrates limited ability to make significant improvements in function in a reasonable and predictable amount of time     Precautions / Restrictions  Precautions Precautions: Fall Restrictions Weight Bearing Restrictions Per Provider Order: No      Mobility  Bed Mobility Overal bed mobility: Needs Assistance Bed Mobility: Rolling Rolling: Total assist, +2 for physical assistance         General bed mobility comments: Patient with poor attempts to initiate rolling, requiring total assist to complete. Due to lethargy, unable to attempt EOB at this time.    Transfers                   General transfer comment: unable            Pertinent Vitals/Pain Pain Assessment Pain Assessment: No/denies pain    Home Living Family/patient expects to be discharged to:: Skilled nursing facility                   Additional Comments: Patient is poor historian, family not present to provide PLOF. Per chart review from previous visit, patient lived at home with family and 24/7 assist/supervision. One level home with 2 steps to enter with no rails. Lives on main level. Has various equipment such as RW, SPC, and BSC. Patient was directly admitted from SNF for current admission.    Prior Function Prior Level of Function : Needs assist       Physical Assist : ADLs (physical);Mobility (physical) Mobility (physical): Bed mobility;Transfers;Gait ADLs (physical): Bathing;Dressing;Toileting;IADLs         Extremity/Trunk Assessment        Lower Extremity Assessment Lower Extremity Assessment: Generalized weakness (Increased stiffness R LE compared to L LE. Poor ability to follow accurate motor commands but completes AAROM B LE without limitation.)       Communication   Communication Communication: Impaired;Other (comment) (Patient attempted to respond to orientation questions but speech not making sense, unable to understand.)    Cognition Arousal: Lethargic  PT - Cognitive impairments: No family/caregiver present to determine baseline, Difficult to assess Difficult to assess due to: Level of arousal                        Following commands: Impaired Following commands impaired: Follows one step commands inconsistently     Cueing Cueing Techniques: Verbal cues, Tactile cues     General Comments General comments (skin integrity, edema, etc.): Unable to assess balance secondary to lethargy.        Assessment/Plan    PT Assessment Patient needs continued PT services  PT Problem List Decreased strength;Decreased range of motion;Decreased activity tolerance;Decreased balance;Decreased mobility;Decreased cognition;Decreased knowledge of use of DME;Decreased safety awareness;Decreased knowledge of precautions;Cardiopulmonary status limiting activity       PT Treatment Interventions DME instruction;Gait training;Functional mobility training;Therapeutic activities;Therapeutic exercise;Balance training;Neuromuscular re-education;Patient/family education;Cognitive remediation;Wheelchair mobility training    PT Goals (Current goals can be found in the Care Plan section)  Acute Rehab PT Goals PT Goal Formulation: Patient unable to participate in goal setting Time For Goal Achievement: 12/23/23 Potential to Achieve Goals: Fair    Frequency Min 1X/week        AM-PAC PT "6 Clicks" Mobility  Outcome Measure Help needed turning from your back to your side while in a flat bed without using bedrails?: A Lot Help needed moving from lying on your back to sitting on the side of a flat bed without using bedrails?: Total Help needed moving to and from a bed to a chair (including a wheelchair)?: Total Help needed standing up from a chair using your arms (e.g., wheelchair or bedside chair)?: Total Help needed to walk in hospital room?: Total Help needed climbing 3-5 steps with a railing? : Total 6 Click Score: 7    End of Session   Activity Tolerance: Patient limited by lethargy Patient left: in bed;with bed alarm set Nurse Communication: Mobility status PT Visit Diagnosis: Muscle  weakness (generalized) (M62.81);Other abnormalities of gait and mobility (R26.89)    Time: 4332-9518 PT Time Calculation (min) (ACUTE ONLY): 15 min   Charges:   PT Evaluation $PT Eval Moderate Complexity: 1 Mod   PT General Charges $$ ACUTE PT VISIT: 1 Visit         Mariano Shiver, PT, DPT [facility] Office: [phone]   Davey Erp 12/09/2023, 3:39 PM

## 2023-12-09 NOTE — Progress Notes (Addendum)
 Patient ID: Tony Cantu, male   DOB: 11-12-1950, 73 y.o.   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  CT abdomen and pelvis yesterday showed mild circumferential thickening of the rectum mild to moderate perirectal fat stranding favoring proctitis otherwise no acute inflammatory process in the abdomen or pelvis, cirrhotic appearing liver, no splenomegaly or ascites  Venous ammonia on admission 111, today up to 154-just starting lactulose via NG today, and getting lactulose enema  WBC 5.7/hemoglobin 10.6/hematocrit 31.6 stable/MCV 104/platelets 161 BNP 420 Potassium 3.1/BUN 8/creatinine 0.85 INR yesterday 1.9 in setting of Eliquis  Patient remains lethargic groans when spoken to or shaking but will not open his eyes, receiving a lactulose enema, dark old melenic stool in the bed, no overt blood or tarry melena  Patient unable to give additional review of systems due to altered mental status.   Objective   Vital signs in last 24 hours: Temp:  [97.8 F (36.6 C)-98.9 F (37.2 C)] 97.8 F (36.6 C) (04/12 0800) Pulse Rate:  [65-79] 79 (04/12 0834) Resp:  [13-20] 14 (04/12 0834) BP: (102-133)/(66-79) 124/69 (04/12 0800) SpO2:  [94 %-100 %] 96 % (04/12 0834) Weight:  [75.1 kg] 75.1 kg (04/12 0800) Last BM Date : 12/09/23 General: Elderly white male lethargic, not really arousable enough to speak Heart:  Regular rate and rhythm; no murmurs Lungs: Respirations even and unlabored, lungs CTA bilaterally Abdomen:  Soft, nontender and nondistended. Normal bowel sounds. Extremities:  Without edema. Neurologic: Obtunded, moans when spoken to or shaken   Intake/Output from previous day: 04/11 0701 - 04/12 0700 In: -  Out: 850 [Urine:850] Intake/Output this shift: No intake/output data recorded.  Lab  Results: Recent Labs    12/08/23 1843 12/09/23 0038 12/09/23 0648  WBC 5.3 4.7 5.7  HGB 10.8* 10.5* 10.6*  HCT 32.0* 31.1* 31.6*  PLT 145* 145* 161   BMET Recent Labs    12/08/23 1332 12/08/23 1406 12/09/23 0648  NA 142 142 144  K 3.4* 3.3* 3.1*  CL 110  --  112*  CO2 22  --  22  GLUCOSE 182*  --  117*  BUN 11  --  8  CREATININE 0.85  --  0.85  CALCIUM 9.1  --  9.3   LFT Recent Labs    12/09/23 0648  PROT 5.6*  ALBUMIN 2.4*  AST 64*  ALT 45*  ALKPHOS 90  BILITOT 1.1   PT/INR Recent Labs    12/08/23 1705  LABPROT 21.8*  INR 1.9*    Studies/Results: DG Abd 1 View Result Date: 12/08/2023 CLINICAL DATA:  NG tube placement EXAM: ABDOMEN - 1 VIEW COMPARISON:  None Available. FINDINGS: NG tube is in the proximal to mid stomach. Nonobstructive bowel gas pattern. IMPRESSION: NG tube in the stomach. Electronically Signed   By: Janeece Mechanic M.D.   On: 12/08/2023 23:14   CT ABDOMEN PELVIS W CONTRAST Result Date: 12/08/2023 CLINICAL DATA:  Abdominal pain, acute, nonlocalized EXAM: CT ABDOMEN AND PELVIS WITH CONTRAST TECHNIQUE: Multidetector CT imaging of the abdomen and pelvis was performed using the standard protocol following bolus administration of intravenous contrast. RADIATION DOSE REDUCTION: This exam was performed according to the departmental dose-optimization program which includes automated exposure control, adjustment of the mA and/or kV according to patient size and/or use of iterative reconstruction technique. CONTRAST:  75mL OMNIPAQUE IOHEXOL 350 MG/ML SOLN COMPARISON:  CT scan abdomen and pelvis from 09/19/2023. FINDINGS: Lower chest: The lung bases are clear. No pleural effusion. The heart is normal in size. No pericardial effusion. Dense mitral annulus and aortic valve calcifications noted. Hepatobiliary: The liver is normal in size. Cirrhotic liver configuration. No focal mass seen within the limitations of this exam. No intrahepatic or extrahepatic bile duct  dilation. No calcified gallstones. Normal gallbladder Weitzel thickness. No pericholecystic inflammatory changes. Pancreas: Small/atrophic pancreas.  No discrete focal mass seen. Spleen: Within normal limits. No focal lesion. Adrenals/Urinary Tract: Unremarkable right adrenal gland. Left adrenal gland is partially visualized surrounded by multiple venous collaterals. No discrete focal mass seen. No suspicious renal mass. There are multiple bilateral simple renal cysts. There are at least 3, 2-3 mm nonobstructing calculi in the left kidney and at least 1, 1 mm sized nonobstructing calculus in the right kidney. No ureterolithiasis or obstructive uropathy. Unremarkable urinary bladder. Stomach/Bowel: No disproportionate dilation of the small or large bowel loops. Unremarkable appendix. There is mild circumferential thickening of the rectum with mild to moderate perirectal fat stranding and trace presacral fluid, favoring proctitis. Correlate clinically. Vascular/Lymphatic: No ascites or pneumoperitoneum. No abdominal or pelvic lymphadenopathy, by size criteria. No aneurysmal dilation of the major abdominal arteries. There are mild peripheral atherosclerotic vascular calcifications of the aorta and its major branches. Reproductive: Normal size prostate. Symmetric seminal vesicles. Other: There is a tiny fat containing umbilical hernia. The soft tissues and abdominal Pollitt are otherwise unremarkable. Musculoskeletal: No suspicious osseous lesions. There are mild - moderate multilevel degenerative changes in the visualized spine. IMPRESSION: 1. Mild circumferential thickening of the rectum with mild to moderate perirectal fat stranding and trace presacral fluid, favoring proctitis. 2. Otherwise, no acute inflammatory process identified within the abdomen or pelvis. 3. Cirrhotic liver with multiple venous collaterals, suggesting sequela of portal hypertension. No splenomegaly or ascites. 4. Multiple other nonacute  observations, as described above. Aortic Atherosclerosis (ICD10-I70.0). Electronically Signed   By: Beula Brunswick M.D.   On: 12/08/2023 16:50   CT HEAD WO CONTRAST ( ) Result Date: 12/08/2023 CLINICAL DATA:  Provided history: Mental status change, persistent or worsening. Altered mental status. On blood thinners. Fatigue. EXAM: CT HEAD WITHOUT CONTRAST TECHNIQUE: Contiguous axial images were obtained from the base of the skull through the vertex without intravenous contrast. RADIATION DOSE REDUCTION: This exam was performed according to the departmental dose-optimization program which includes automated exposure control, adjustment of the mA and/or kV according to patient size and/or use of iterative reconstruction technique. COMPARISON:  Brain MRI 09/20/2023. Head CT 09/20/2023. FINDINGS: Brain: Generalized parenchymal atrophy. Prominent perivascular space versus chronic lacunar infarct again demonstrated within the right frontal lobe centrum semiovale (series 6, image 22). Mild patchy and ill-defined hypoattenuation elsewhere within the cerebral white matter, nonspecific but compatible with chronic small vessel disease. There is no acute intracranial hemorrhage. No demarcated cortical infarct. No extra-axial fluid collection. No evidence of an intracranial mass. No midline shift. Vascular: No hyperdense vessel.  Atherosclerotic calcifications. Skull: No calvarial fracture or aggressive osseous lesion. Sinuses/Orbits: No mass or acute finding within the imaged orbits. No significant paranasal sinus disease. Other: Surgical fixation hardware along the nasal bones, bilaterally. IMPRESSION: 1. No evidence of an acute intracranial abnormality. 2. Prominent perivascular space versus chronic lacunar infarct within the right frontal lobe centrum semiovale, unchanged. 3. Background mild cerebral white matter chronic small vessel ischemic disease. 4. Generalized parenchymal atrophy. Electronically Signed   By: Jenine Mix  Jenean Minus D.O.   On: 12/08/2023 16:43   DG Chest Portable 1 View Result Date: 12/08/2023 CLINICAL DATA:  Altered mental status. EXAM: PORTABLE CHEST 1 VIEW COMPARISON:  Chest radiograph dated 10/24/2023. FINDINGS: Stable cardiomediastinal silhouette. Aortic atherosclerosis. Low lung volumes. No focal consolidation, sizeable pleural effusion, or pneumothorax. Stable right chest generator device. Prior reverse right shoulder arthroplasty. No acute osseous abnormality. IMPRESSION: Low lung volumes.  No acute cardiopulmonary findings. Electronically Signed   By: Mannie Seek M.D.   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  #3 severe aortic stenosis #4.  Atrial fibrillation-on Eliquis chronically #5.  Insulin-dependent diabetes mellitus #6.  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 until he is much improved from an encephalopathy standpoint NG has been placed, starting high-dose lactulose via NG today Receiving lactulose enema currently Continue twice daily PPI He will eventually need EGD this  admission, not emergent at present as hemoglobin stable and Eliquis has been reversed Timing to be determined based on his status regarding the hepatic encephalopathy. GI will continue to follow with you  In the big picture, need goals of care discussion as patient has had multiple hospitalizations recently and significant decline.  Also suspect will need to leave him off Eliquis in setting of his recurrent GI bleeding issues.     Principal Problem:   Acute GI bleeding Active Problems:   Asthma   Obesity   OSA (obstructive sleep apnea)   Diabetes type 2, controlled (HCC)   BPH (benign prostatic hyperplasia)   Paroxysmal tachycardia (HCC)   Hypertension associated with type 2 diabetes mellitus (HCC)   Hyperlipidemia associated with type 2 diabetes mellitus (HCC)   GERD with esophagitis   PAF (paroxysmal atrial fibrillation) (HCC)   Grade I diastolic dysfunction   Chronic obstructive pulmonary disease (HCC)   NAFLD (nonalcoholic fatty liver disease)   Portal hypertension (HCC)   Other cirrhosis of liver (HCC)     LOS: 1 day   Amy EsterwoodPA-C  12/09/2023, 11:25 AM  I have taken an interval history, thoroughly reviewed the chart and examined the patient. I agree with the Advanced Practitioner's note, impression and recommendations, and have recorded additional findings, impressions and recommendations below. I performed a substantive portion of this encounter (>50% time spent), including a complete performance of the medical decision making.  My additional thoughts are as follows:  In addition, I lavaged his NG tube and got bilious return without blood.  Still suspect his bleeding source was upper is now stopped after reversal was anticoagulation with Kcentra.  Question of rectal Zeigler thickening on CT scan of doubtful clinical significance. Noted to be cirrhotic but without esophageal or  gastric varices on December 2024 EGD.  No ascites, now encephalopathic  After seeing  this patient yesterday afternoon to the ED, we discussed the recommendations directly with the ED physician.  Looks like his NG tube when he late last evening and no lactulose have been started as of the time that Dr. Zelda Hickman saw this patient this morning.  He has since been given a lactulose enema and lactulose 30 g is being given through the NG tube 3 times daily.  He needs this treatment more than anything else at this point to try reversing his encephalopathy because he is so stuporous and therefore risk of respiratory decompensation, vomiting and aspiration.  I put his head of bed up when I was seeing him and that should stay at least 30 degrees elevated  Hemoglobin has stabilized.  Continue to monitor, no octreotide needed.  Eventual EGD when encephalopathy clears.   35 minutes were spent on this encounter (including chart review, history/exam, counseling/coordination of care, and documentation) > 50% of that time was spent on counseling and coordination of care.   Kerby Pearson III Office:[phone]

## 2023-12-09 NOTE — Plan of Care (Signed)

## 2023-12-10 ENCOUNTER — Other Ambulatory Visit: Payer: Self-pay

## 2023-12-10 DIAGNOSIS — K921 Melena: Secondary | ICD-10-CM

## 2023-12-10 DIAGNOSIS — K7469 Other cirrhosis of liver: Secondary | ICD-10-CM | POA: Diagnosis not present

## 2023-12-10 DIAGNOSIS — K7682 Hepatic encephalopathy: Secondary | ICD-10-CM

## 2023-12-10 DIAGNOSIS — K920 Hematemesis: Secondary | ICD-10-CM | POA: Diagnosis not present

## 2023-12-10 DIAGNOSIS — K7460 Unspecified cirrhosis of liver: Secondary | ICD-10-CM | POA: Diagnosis not present

## 2023-12-10 DIAGNOSIS — D62 Acute posthemorrhagic anemia: Secondary | ICD-10-CM | POA: Diagnosis not present

## 2023-12-10 DIAGNOSIS — K7581 Nonalcoholic steatohepatitis (NASH): Secondary | ICD-10-CM | POA: Diagnosis not present

## 2023-12-10 LAB — COMPREHENSIVE METABOLIC PANEL WITH GFR
ALT: 41 U/L (ref 0–44)
AST: 46 U/L — ABNORMAL HIGH (ref 15–41)
Albumin: 2.2 g/dL — ABNORMAL LOW (ref 3.5–5.0)
Alkaline Phosphatase: 80 U/L (ref 38–126)
Anion gap: 9 (ref 5–15)
BUN: 6 mg/dL — ABNORMAL LOW (ref 8–23)
CO2: 22 mmol/L (ref 22–32)
Calcium: 9.1 mg/dL (ref 8.9–10.3)
Chloride: 114 mmol/L — ABNORMAL HIGH (ref 98–111)
Creatinine, Ser: 0.86 mg/dL (ref 0.61–1.24)
GFR, Estimated: >60 mL/min (ref >60)
Glucose, Bld: 293 mg/dL — ABNORMAL HIGH (ref 70–99)
Potassium: 2.9 mmol/L — ABNORMAL LOW (ref 3.5–5.1)
Sodium: 145 mmol/L (ref 135–145)
Total Bilirubin: 0.9 mg/dL (ref 0.0–1.2)
Total Protein: 5.5 g/dL — ABNORMAL LOW (ref 6.5–8.1)

## 2023-12-10 LAB — AMMONIA: Ammonia: 61 umol/L — ABNORMAL HIGH (ref 9–35)

## 2023-12-10 LAB — PROTIME-INR
INR: 1.3 — ABNORMAL HIGH (ref 0.8–1.2)
Prothrombin Time: 16.1 s — ABNORMAL HIGH (ref 11.4–15.2)

## 2023-12-10 LAB — CBC WITH DIFFERENTIAL/PLATELET
Abs Immature Granulocytes: 0.03 10*3/uL (ref 0.00–0.07)
Basophils Absolute: 0.1 10*3/uL (ref 0.0–0.1)
Basophils Relative: 1 %
Eosinophils Absolute: 0.1 10*3/uL (ref 0.0–0.5)
Eosinophils Relative: 2 %
HCT: 31.5 % — ABNORMAL LOW (ref 39.0–52.0)
Hemoglobin: 10.4 g/dL — ABNORMAL LOW (ref 13.0–17.0)
Immature Granulocytes: 1 %
Lymphocytes Relative: 18 %
Lymphs Abs: 1.2 10*3/uL (ref 0.7–4.0)
MCH: 35 pg — ABNORMAL HIGH (ref 26.0–34.0)
MCHC: 33 g/dL (ref 30.0–36.0)
MCV: 106.1 fL — ABNORMAL HIGH (ref 80.0–100.0)
Monocytes Absolute: 0.7 10*3/uL (ref 0.1–1.0)
Monocytes Relative: 11 %
Neutro Abs: 4.3 10*3/uL (ref 1.7–7.7)
Neutrophils Relative %: 67 %
Platelets: 155 10*3/uL (ref 150–400)
RBC: 2.97 MIL/uL — ABNORMAL LOW (ref 4.22–5.81)
RDW: 17.7 % — ABNORMAL HIGH (ref 11.5–15.5)
WBC: 6.4 10*3/uL (ref 4.0–10.5)
nRBC: 0 % (ref 0.0–0.2)

## 2023-12-10 LAB — GLUCOSE, CAPILLARY
Glucose-Capillary: 192 mg/dL — ABNORMAL HIGH (ref 70–99)
Glucose-Capillary: 225 mg/dL — ABNORMAL HIGH (ref 70–99)
Glucose-Capillary: 240 mg/dL — ABNORMAL HIGH (ref 70–99)
Glucose-Capillary: 266 mg/dL — ABNORMAL HIGH (ref 70–99)
Glucose-Capillary: 271 mg/dL — ABNORMAL HIGH (ref 70–99)
Glucose-Capillary: 273 mg/dL — ABNORMAL HIGH (ref 70–99)
Glucose-Capillary: 305 mg/dL — ABNORMAL HIGH (ref 70–99)

## 2023-12-10 LAB — MAGNESIUM: Magnesium: 1.8 mg/dL (ref 1.7–2.4)

## 2023-12-10 LAB — BRAIN NATRIURETIC PEPTIDE: B Natriuretic Peptide: 423 pg/mL — ABNORMAL HIGH (ref 0.0–100.0)

## 2023-12-10 MED ORDER — LACTULOSE 10 GM/15ML PO SOLN
30.0000 g | Freq: Four times a day (QID) | ORAL | Status: DC
  Administered 2023-12-10 – 2023-12-11 (×4): 30 g
  Filled 2023-12-10 (×4): qty 60

## 2023-12-10 MED ORDER — POTASSIUM CHLORIDE 2 MEQ/ML IV SOLN
INTRAVENOUS | Status: DC
  Filled 2023-12-10 (×2): qty 1000

## 2023-12-10 MED ORDER — RIFAXIMIN 550 MG PO TABS
550.0000 mg | ORAL_TABLET | Freq: Two times a day (BID) | ORAL | Status: DC
  Administered 2023-12-10 – 2024-01-11 (×64): 550 mg via ORAL
  Filled 2023-12-10 (×64): qty 1

## 2023-12-10 MED ORDER — MAGNESIUM SULFATE IN D5W 1-5 GM/100ML-% IV SOLN
1.0000 g | Freq: Once | INTRAVENOUS | Status: AC
  Administered 2023-12-10: 1 g via INTRAVENOUS
  Filled 2023-12-10: qty 100

## 2023-12-10 MED ORDER — DEXTROSE IN LACTATED RINGERS 5 % IV SOLN: INTRAVENOUS | Status: DC

## 2023-12-10 MED ORDER — LACTULOSE 10 GM/15ML PO SOLN
30.0000 g | Freq: Three times a day (TID) | ORAL | Status: DC
  Administered 2023-12-10 (×2): 30 g
  Filled 2023-12-10 (×2): qty 60

## 2023-12-10 MED ORDER — INSULIN ASPART 100 UNIT/ML IJ SOLN
0.0000 [IU] | Freq: Four times a day (QID) | INTRAMUSCULAR | Status: DC
  Administered 2023-12-10 (×2): 5 [IU] via SUBCUTANEOUS
  Administered 2023-12-10: 3 [IU] via SUBCUTANEOUS
  Administered 2023-12-11: 7 [IU] via SUBCUTANEOUS

## 2023-12-10 NOTE — Evaluation (Signed)
 Occupational Therapy Evaluation Patient Details Name: Tony Cantu MRN: [id number] DOB: Jul 04, 1951 Today's Date: 12/10/2023   History of Present Illness   Patient is 73 yo male admitted on 12/08/23 for AMS, nausea, vomiting, and dark stool. PMHx significant for type 1 diabetes, A-fib on Eliquis, seizure disorder, aortic stenosis, HTN, peptic ulcer disease, OA with bilateral knee replacement, polyclonal gammopathy, OSA on CPAP, GERD, COPD/asthma, chronic diastolic CHF, cirrhosis, portal hypertension, and BPH.     Clinical Impressions PTA, per chart, pt recently at SNF rehabilitation where he was working on transfers and return to ADL. Upon eval, pt lethargic, following only one step commands, needing cues for attention to task and arousal; demonstrating focused attention throughout. Pt able to perform bed mobility with mod A, LB ADL with max A and STS transfer with RW with mod A. Recommending inpatient rehab <3 hours to optimize independence and reduce burden of care.    If plan is discharge home, recommend the following:   Two people to help with walking and/or transfers;A lot of help with bathing/dressing/bathroom;Assistance with cooking/housework;Direct supervision/assist for medications management;Direct supervision/assist for financial management;Assist for transportation;Help with stairs or ramp for entrance;Supervision due to cognitive status     Functional Status Assessment   Patient has had a recent decline in their functional status and demonstrates the ability to make significant improvements in function in a reasonable and predictable amount of time.     Equipment Recommendations   Other (comment) (defer)     Recommendations for Other Services         Precautions/Restrictions   Precautions Precautions: Fall Recall of Precautions/Restrictions: Impaired Restrictions Weight Bearing Restrictions Per Provider Order: No     Mobility Bed Mobility Overal bed  mobility: Needs Assistance Bed Mobility: Sit to Supine, Supine to Sit     Supine to sit: Mod assist, Used rails, HOB elevated Sit to supine: Mod assist, Used rails   General bed mobility comments: poor initiation, needing A for sequencing and larger amplitude moevement to bring BLE toward EOB; elevating trunk with min A. pt needing mod A to bring BLE back into bed    Transfers Overall transfer level: Needs assistance Equipment used: Rolling walker (2 wheels) Transfers: Sit to/from Stand Sit to Stand: Mod assist           General transfer comment: tactile cues for anterior weight shift and then mod A to rise. Poor strength/activity tolerance to remain standing; able to stand ~5 seconds before self-initiated sit on 2 attempts      Balance Overall balance assessment: Needs assistance Sitting-balance support: Feet supported, Bilateral upper extremity supported Sitting balance-Leahy Scale: Poor     Standing balance support: Bilateral upper extremity supported, Reliant on assistive device for balance Standing balance-Leahy Scale: Poor                             ADL either performed or assessed with clinical judgement   ADL Overall ADL's : Needs assistance/impaired     Grooming: Wash/dry face;Contact guard assist;Sitting;Cueing for sequencing   Upper Body Bathing: Minimal assistance;Sitting;Cueing for sequencing   Lower Body Bathing: Maximal assistance;Sitting/lateral leans   Upper Body Dressing : Moderate assistance;Cueing for sequencing;Sitting   Lower Body Dressing: Maximal assistance;Sitting/lateral leans   Toilet Transfer: Moderate assistance Toilet Transfer Details (indicate cue type and reason): STS         Functional mobility during ADLs: Moderate assistance;Cueing for sequencing General ADL Comments: STS. Limited by  lethargy, poor cognition     Vision Baseline Vision/History: 1 Wears glasses Ability to See in Adequate Light: 0  Adequate Patient Visual Report: No change from baseline       Perception Perception: Not tested       Praxis Praxis: Not tested       Pertinent Vitals/Pain Pain Assessment Pain Assessment: Faces Faces Pain Scale: Hurts little more Pain Location: generalized Pain Descriptors / Indicators: Discomfort Pain Intervention(s): Monitored during session, Limited activity within patient's tolerance     Extremity/Trunk Assessment Upper Extremity Assessment Upper Extremity Assessment: Generalized weakness   Lower Extremity Assessment Lower Extremity Assessment: Defer to PT evaluation       Communication Communication Communication: Impaired Factors Affecting Communication: Reduced clarity of speech (mumbling; pt lethargic)   Cognition Arousal: Lethargic Behavior During Therapy: WFL for tasks assessed/performed Cognition: Cognition impaired, No family/caregiver present to determine baseline   Orientation impairments: Situation, Time Awareness: Intellectual awareness impaired, Online awareness impaired Memory impairment (select all impairments): Short-term memory Attention impairment (select first level of impairment): Sustained attention Executive functioning impairment (select all impairments): Problem solving, Reasoning, Sequencing, Organization OT - Cognition Comments: follows one step commands only; able to provide one word answers to direct questions. Only able to tell therapist he was at [facility] PTA.                 Following commands: Impaired Following commands impaired: Only follows one step commands consistently     Cueing  General Comments   Cueing Techniques: Verbal cues;Tactile cues      Exercises     Shoulder Instructions      Home Living Family/patient expects to be discharged to:: Skilled nursing facility                                 Additional Comments: Patient is poor historian, family not present to provide PLOF. Per chart  review from previous visit, patient lived at home with family and 24/7 assist/supervision. One level home with 2 steps to enter with no rails. Lives on main level with walk in shower. Has various equipment such as RW, SPC, and BSC. Patient was directly admitted from SNF for current admission.      Prior Functioning/Environment Prior Level of Function : Needs assist       Physical Assist : ADLs (physical);Mobility (physical) Mobility (physical): Bed mobility;Transfers;Gait ADLs (physical): Bathing;Dressing;Toileting;IADLs Mobility Comments: Pt states he was working with rehab at [facility] ADLs Comments: Needs assist for ADLs at SNF    OT Problem List: Decreased strength;Decreased range of motion;Decreased activity tolerance;Impaired balance (sitting and/or standing);Decreased safety awareness;Decreased cognition;Decreased knowledge of use of DME or AE   OT Treatment/Interventions: Self-care/ADL training;Therapeutic exercise;DME and/or AE instruction;Therapeutic activities;Cognitive remediation/compensation;Patient/family education;Balance training      OT Goals(Current goals can be found in the care plan section)   Acute Rehab OT Goals Patient Stated Goal: rest OT Goal Formulation: With patient Time For Goal Achievement: 12/24/23 Potential to Achieve Goals: Good   OT Frequency:  Min 1X/week    Co-evaluation              AM-PAC OT "6 Clicks" Daily Activity     Outcome Measure Help from another person eating meals?: A Little Help from another person taking care of personal grooming?: A Little Help from another person toileting, which includes using toliet, bedpan, or urinal?: A Lot Help from another person bathing (including washing, rinsing,  drying)?: A Lot Help from another person to put on and taking off regular upper body clothing?: A Lot Help from another person to put on and taking off regular lower body clothing?: A Lot 6 Click Score: 14   End of Session Equipment  Utilized During Treatment: Gait belt;Rolling walker (2 wheels) Nurse Communication: Mobility status  Activity Tolerance: Patient limited by lethargy Patient left: in bed;with bed alarm set;with call bell/phone within reach  OT Visit Diagnosis: Unsteadiness on feet (R26.81);Other abnormalities of gait and mobility (R26.89);Muscle weakness (generalized) (M62.81);Other symptoms and signs involving cognitive function                Time: 8119-1478 OT Time Calculation (min): 21 min Charges:  OT General Charges $OT Visit: 1 Visit OT Evaluation $OT Eval Moderate Complexity: 1 Mod  Karilyn Ouch, OTR/L [facility] Acute Rehabilitation Office: [phone]   Tony Cantu 12/10/2023, 3:05 PM

## 2023-12-10 NOTE — Progress Notes (Signed)
 PROGRESS NOTE                                                                                                                                                                                                             Patient Demographics:    Tony Cantu, is a 73 y.o. male, DOB - 1951/04/26, ZOX:[id number]  Outpatient Primary MD for the patient is Tony Guerin, DO    LOS - 2  Admit date - 12/08/2023    Chief Complaint  Patient presents with   Fatigue       Brief Narrative (HPI from H&P)    73 y.o. male with medical history significant of hypertension, hyperlipidemia, diabetes, paroxysmal A-fib, paroxysmal tachycardia, GERD, COPD/asthma, chronic diastolic CHF, NAFLD, cirrhosis, portal hypertension, obesity, OSA, BPH, presenting with altered mental status and nausea vomiting and dark stool.   History team with assistance of chart review and EMS report.  Patient reportedly at facility after fall.  Patient has been acting differently for the past 1 to 2 days.  He has had increased fatigue and somnolence.  Reportedly has had nausea and vomiting with black emesis and now black stools.  He will nod to answer questions but has not been speaking.  Further workup was consistent with severe hepatic encephalopathy and possible upper GI bleed, GI was consulted and he was admitted.   Subjective:   Patient in bed, appears comfortable, denies any headache, no fever, no chest pain or pressure, no shortness of breath , no abdominal pain. No focal weakness.   Assessment  & Plan :   UGI bleed in a patient with  Tony Cantu, cirrhosis, portal hypertension and gastric varices >  History of cirrhosis with portal hypertension/varices, with melena as the presenting symptom. > GI consulted and have seen the patient and recommend IV PPI, IV octreotide, Eliquis reversed in the ER, has NG tube n.p.o., continue to monitor CBC appears to be staying stable.   Transfuse as needed.   Severe hepatic encephalopathy with underlying history of [place]  > NG tube in, lactulose via NG tube along with Xifaxan, level is trending down, mentation has improved on 12/10/2023, no headache, no focal deficits, head CT stable.   Hypertension - Not on any antihypertensives   Hyperlipidemia - Resume atorvastatin when tolerating p.o.   Paroxysmal atrial fibrillation - Resume amiodarone when  tolerating p.o. with underlying history of Tony Cantu will defer it to his primary cardiologist on long-term use of amiodarone. - Eliquis held/reversed as above.  As needed IV Cardizem   GERD - On IV PPI   COPD/asthma - Resume Singulair and tolerating p.o. - Not on inhalers, no acute issues   Chronic diastolic CHF > Last echocardiogram, Was TEE in January 2025 which showed EF 60-65%, normal RV function. > Not on diuretic - Continue to monitor   QTc prolongation > With underlying right bundle branch block, hard to discern correct QTc.  Place electrolytes and monitor on telemetry.   Severe hypokalemia.  Replaced.    Diabetes -D5W while n.p.o., SSI if needed  Lab Results  Component Value Date   HGBA1C 4.4 (L) 09/19/2023   CBG (last 3)  Recent Labs    12/10/23 0418 12/10/23 0635 12/10/23 0801  GLUCAP 240* 271* 266*         Condition - Extremely Guarded  Family Communication  : Ex-wife and primary decision maker is Tony Cantu [phone] in detail on 12/09/2023  Code Status : DNR  Consults  :  GI  PUD Prophylaxis : PPI   Procedures  :     CT head nonacute.    CT   abdomen pelvis.   1. Mild circumferential thickening of the rectum with mild to moderate perirectal fat stranding and trace presacral fluid, favoring proctitis. 2. Otherwise, no acute inflammatory process identified within the abdomen or pelvis. 3. Cirrhotic liver with multiple venous collaterals, suggesting sequela of portal hypertension. No splenomegaly or ascites. 4. Multiple other nonacute  observations, as described above. Aortic Atherosclerosis (ICD10-I70.0).      Disposition Plan  :    Status is: Inpatient  DVT Prophylaxis  :    Place and maintain sequential compression device Start: 12/09/23 0555 SCDs Start: 12/08/23 1707    Lab Results  Component Value Date   PLT 155 12/10/2023    Diet :  Diet Order             Diet NPO time specified  Diet effective now                    Inpatient Medications  Scheduled Meds:  insulin aspart  0-9 Units Subcutaneous Q6H   lactulose  30 g Per Tube TID   pantoprazole (PROTONIX) IV  40 mg Intravenous Q12H   rifaximin  550 mg Per Tube BID   sodium chloride flush  3 mL Intravenous Q12H   Continuous Infusions:  lactated ringers 1,000 mL with potassium chloride 60 mEq infusion     magnesium sulfate bolus IVPB     PRN Meds:.acetaminophen **OR** acetaminophen, diltiazem  Antibiotics  :    Anti-infectives (From admission, onward)    Start     Dose/Rate Route Frequency Ordered Stop   12/09/23 1000  rifaximin (XIFAXAN) tablet 550 mg        550 mg Per Tube 2 times daily 12/09/23 0827           Objective:   Vitals:   12/09/23 2331 12/10/23 0400 12/10/23 0800 12/10/23 0801  BP: 121/67 (!) 126/58 (!) 85/61 (!) 88/52  Pulse: 84 69 72 71  Resp: 16 17 18 18   Temp: 98.2 F (36.8 C) 98 F (36.7 C)  98.3 F (36.8 C)  TempSrc: Oral Oral  Oral  SpO2: 97% 93% 95% 94%  Weight:      Height:        Wt Readings  from Last 3 Encounters:  12/09/23 75.1 kg  11/14/23 98.4 kg  11/03/23 85.2 kg     Intake/Output Summary (Last 24 hours) at 12/10/2023 1004 Last data filed at 12/09/2023 2329 Gross per 24 hour  Intake 1502.21 ml  Output 1400 ml  Net 102.21 ml     Physical Exam  Less drowsy on 12/10/2023, answering basic questions and following basic commands, no focal deficits, NG in place,    [address].AT,PERRAL Supple Neck, No JVD,   Symmetrical Chest Cutting movement, Good air movement bilaterally, CTAB RRR,No  Gallops,Rubs or new Murmurs,  +ve B.Sounds, Abd Soft, No tenderness,   No Cyanosis, Clubbing or edema      Data Review:    Recent Labs  Lab 12/04/23 1216 12/08/23 1332 12/08/23 1406 12/08/23 1843 12/09/23 0038 12/09/23 0648 12/10/23 0813  WBC 4.9 5.8  --  5.3 4.7 5.7 6.4  HGB 13.2 10.6* 10.5* 10.8* 10.5* 10.6* 10.4*  HCT 40.3 32.4* 31.0* 32.0* 31.1* 31.6* 31.5*  PLT 149* 161  --  145* 145* 161 155  MCV 107.2* 106.2*  --  104.6* 103.7* 104.3* 106.1*  MCH 35.1* 34.8*  --  35.3* 35.0* 35.0* 35.0*  MCHC 32.8 32.7  --  33.8 33.8 33.5 33.0  RDW 17.0* 17.3*  --  17.2* 17.3* 17.3* 17.7*  LYMPHSABS 1.0 1.1  --   --   --   --  1.2  MONOABS 0.6 0.7  --   --   --   --  0.7  EOSABS 0.1 0.0  --   --   --   --  0.1  BASOSABS 0.1 0.0  --   --   --   --  0.1    Recent Labs  Lab 12/04/23 1216 12/08/23 1332 12/08/23 1406 12/08/23 1705 12/08/23 1843 12/09/23 0648 12/10/23 0813  NA 136 142 142  --   --  144 145  K 3.5 3.4* 3.3*  --   --  3.1* 2.9*  CL 103 110  --   --   --  112* 114*  CO2 21* 22  --   --   --  22 22  ANIONGAP 12 10  --   --   --  10 9  GLUCOSE 368* 182*  --   --   --  117* 293*  BUN 15 11  --   --   --  8 6*  CREATININE 0.87 0.85  --   --   --  0.85 0.86  AST 48* 75*  --   --   --  64* 46*  ALT 42 47*  --   --   --  45* 41  ALKPHOS 118 90  --   --   --  90 80  BILITOT 1.1 0.9  --   --   --  1.1 0.9  ALBUMIN 2.7* 2.4*  --   --   --  2.4* 2.2*  LATICACIDVEN  --   --  1.6  --   --   --   --   INR  --   --   --  1.9*  --   --  1.3*  TSH  --   --   --   --  1.469  --   --   AMMONIA  --  111*  --   --   --  154* 61*  BNP  --   --   --   --   --  420.3* 423.0*  MG  --   --   --   --   --  1.8 1.8  CALCIUM 9.3 9.1  --   --   --  9.3 9.1      Recent Labs  Lab 12/04/23 1216 12/08/23 1332 12/08/23 1406 12/08/23 1705 12/08/23 1843 12/09/23 0648 12/10/23 0813  LATICACIDVEN  --   --  1.6  --   --   --   --   INR  --   --   --  1.9*  --   --  1.3*  TSH  --   --    --   --  1.469  --   --   AMMONIA  --  111*  --   --   --  154* 61*  BNP  --   --   --   --   --  420.3* 423.0*  MG  --   --   --   --   --  1.8 1.8  CALCIUM 9.3 9.1  --   --   --  9.3 9.1    --------------------------------------------------------------------------------------------------------------- Lab Results  Component Value Date   CHOL 126 01/04/2022   HDL 57 01/04/2022   LDLCALC 55 01/04/2022   TRIG 67 01/04/2022   CHOLHDL 2.2 01/04/2022    Lab Results  Component Value Date   HGBA1C 4.4 (L) 09/19/2023   Recent Labs    12/08/23 1843  TSH 1.469     Radiology Report DG Abd 1 View Result Date: 12/08/2023 CLINICAL DATA:  NG tube placement EXAM: ABDOMEN - 1 VIEW COMPARISON:  None Available. FINDINGS: NG tube is in the proximal to mid stomach. Nonobstructive bowel gas pattern. IMPRESSION: NG tube in the stomach. Electronically Signed   By: Janeece Mechanic M.D.   On: 12/08/2023 23:14   CT ABDOMEN PELVIS W CONTRAST Result Date: 12/08/2023 CLINICAL DATA:  Abdominal pain, acute, nonlocalized EXAM: CT ABDOMEN AND PELVIS WITH CONTRAST TECHNIQUE: Multidetector CT imaging of the abdomen and pelvis was performed using the standard protocol following bolus administration of intravenous contrast. RADIATION DOSE REDUCTION: This exam was performed according to the departmental dose-optimization program which includes automated exposure control, adjustment of the mA and/or kV according to patient size and/or use of iterative reconstruction technique. CONTRAST:  75mL OMNIPAQUE IOHEXOL 350 MG/ML SOLN COMPARISON:  CT scan abdomen and pelvis from 09/19/2023. FINDINGS: Lower chest: The lung bases are clear. No pleural effusion. The heart is normal in size. No pericardial effusion. Dense mitral annulus and aortic valve calcifications noted. Hepatobiliary: The liver is normal in size. Cirrhotic liver configuration. No focal mass seen within the limitations of this exam. No intrahepatic or extrahepatic bile  duct dilation. No calcified gallstones. Normal gallbladder Moise thickness. No pericholecystic inflammatory changes. Pancreas: Small/atrophic pancreas.  No discrete focal mass seen. Spleen: Within normal limits. No focal lesion. Adrenals/Urinary Tract: Unremarkable right adrenal gland. Left adrenal gland is partially visualized surrounded by multiple venous collaterals. No discrete focal mass seen. No suspicious renal mass. There are multiple bilateral simple renal cysts. There are at least 3, 2-3 mm nonobstructing calculi in the left kidney and at least 1, 1 mm sized nonobstructing calculus in the right kidney. No ureterolithiasis or obstructive uropathy. Unremarkable urinary bladder. Stomach/Bowel: No disproportionate dilation of the small or large bowel loops. Unremarkable appendix. There is mild circumferential thickening of the rectum with mild to moderate perirectal fat stranding and trace presacral fluid, favoring proctitis. Correlate clinically. Vascular/Lymphatic: No ascites  or pneumoperitoneum. No abdominal or pelvic lymphadenopathy, by size criteria. No aneurysmal dilation of the major abdominal arteries. There are mild peripheral atherosclerotic vascular calcifications of the aorta and its major branches. Reproductive: Normal size prostate. Symmetric seminal vesicles. Other: There is a tiny fat containing umbilical hernia. The soft tissues and abdominal Schmuck are otherwise unremarkable. Musculoskeletal: No suspicious osseous lesions. There are mild - moderate multilevel degenerative changes in the visualized spine. IMPRESSION: 1. Mild circumferential thickening of the rectum with mild to moderate perirectal fat stranding and trace presacral fluid, favoring proctitis. 2. Otherwise, no acute inflammatory process identified within the abdomen or pelvis. 3. Cirrhotic liver with multiple venous collaterals, suggesting sequela of portal hypertension. No splenomegaly or ascites. 4. Multiple other nonacute  observations, as described above. Aortic Atherosclerosis (ICD10-I70.0). Electronically Signed   By: Beula Brunswick M.D.   On: 12/08/2023 16:50   CT HEAD WO CONTRAST ( ) Result Date: 12/08/2023 CLINICAL DATA:  Provided history: Mental status change, persistent or worsening. Altered mental status. On blood thinners. Fatigue. EXAM: CT HEAD WITHOUT CONTRAST TECHNIQUE: Contiguous axial images were obtained from the base of the skull through the vertex without intravenous contrast. RADIATION DOSE REDUCTION: This exam was performed according to the departmental dose-optimization program which includes automated exposure control, adjustment of the mA and/or kV according to patient size and/or use of iterative reconstruction technique. COMPARISON:  Brain MRI 09/20/2023. Head CT 09/20/2023. FINDINGS: Brain: Generalized parenchymal atrophy. Prominent perivascular space versus chronic lacunar infarct again demonstrated within the right frontal lobe centrum semiovale (series 6, image 22). Mild patchy and ill-defined hypoattenuation elsewhere within the cerebral white matter, nonspecific but compatible with chronic small vessel disease. There is no acute intracranial hemorrhage. No demarcated cortical infarct. No extra-axial fluid collection. No evidence of an intracranial mass. No midline shift. Vascular: No hyperdense vessel.  Atherosclerotic calcifications. Skull: No calvarial fracture or aggressive osseous lesion. Sinuses/Orbits: No mass or acute finding within the imaged orbits. No significant paranasal sinus disease. Other: Surgical fixation hardware along the nasal bones, bilaterally. IMPRESSION: 1. No evidence of an acute intracranial abnormality. 2. Prominent perivascular space versus chronic lacunar infarct within the right frontal lobe centrum semiovale, unchanged. 3. Background mild cerebral white matter chronic small vessel ischemic disease. 4. Generalized parenchymal atrophy. Electronically Signed   By: Bascom Lily D.O.   On: 12/08/2023 16:43   DG Chest Portable 1 View Result Date: 12/08/2023 CLINICAL DATA:  Altered mental status. EXAM: PORTABLE CHEST 1 VIEW COMPARISON:  Chest radiograph dated 10/24/2023. FINDINGS: Stable cardiomediastinal silhouette. Aortic atherosclerosis. Low lung volumes. No focal consolidation, sizeable pleural effusion, or pneumothorax. Stable right chest generator device. Prior reverse right shoulder arthroplasty. No acute osseous abnormality. IMPRESSION: Low lung volumes.  No acute cardiopulmonary findings. Electronically Signed   By: Mannie Seek M.D.   On: 12/08/2023 15:10     Signature  -   Lynnwood Sauer M.D on 12/10/2023 at 10:04 AM   -  To page go to [email or web address]

## 2023-12-10 NOTE — Evaluation (Signed)
 Clinical/Bedside Swallow Evaluation Patient Details  Name: RAPHAEL FITZPATRICK MRN: [id number] Date of Birth: September 06, 1950  Today's Date: 12/10/2023 Time: SLP Start Time (ACUTE ONLY): 1447 SLP Stop Time (ACUTE ONLY): 1503 SLP Time Calculation (min) (ACUTE ONLY): 16 min  Past Medical History:  Past Medical History:  Diagnosis Date   Acute renal injury (HCC) 08/06/2016   Aortic atherosclerosis (HCC)    Aortic stenosis    Arthritis    Asthma    BPH (benign prostatic hypertrophy)    Cirrhosis (HCC) 2016   Colon polyps    Diabetes mellitus without complication (HCC)    TYPE 1    Diverticulosis    DKA (diabetic ketoacidosis) (HCC) 10/25/2023   Dysrhythmia 04/19/2021   A-fib noted with possible A-fib RVR   Gastric ulcer    Gastric varices    right   Gastritis    GERD (gastroesophageal reflux disease)    Headache    History- pt states these were migraines that occurred in the 1980's   Heart murmur    History of kidney stones    Hx of adenomatous colonic polyps    Hypertension    Iron deficiency anemia due to chronic blood loss 08/16/2023   Melena 09/20/2023   Portal hypertensive gastropathy (HCC)    Postoperative fever 03/10/2022   Renal cyst, right    Seizures (HCC)    HYPOGLYCEMIC LAST 1 AND 1/2 YRS AGO   Sepsis (HCC) 09/20/2023   Sleep apnea    uses CPAP nightly   Stroke-like symptom 09/20/2023   Testicle trouble    one testicle BORN WITH   Tubular adenoma of colon    Past Surgical History:  Past Surgical History:  Procedure Laterality Date   BACK SURGERY     94  LOWER    CARPAL TUNNEL RELEASE Right 10/13/2015   Procedure: RIGHT CARPAL TUNNEL RELEASE;  Surgeon: Lyanne Sample, MD;  Location: [facility];  Service: Orthopedics;  Laterality: Right;   CARPAL TUNNEL RELEASE Left 07/05/2016   Procedure: LEFT CARPAL TUNNEL RELEASE;  Surgeon: Lyanne Sample, MD;  Location: [facility];  Service: Orthopedics;  Laterality: Left;   COLONOSCOPY WITH  ESOPHAGOGASTRODUODENOSCOPY (EGD)  10/05/2022   DRUG INDUCED ENDOSCOPY N/A 07/02/2021   Procedure: DRUG INDUCED SLEEP ENDOSCOPY;  Surgeon: Virgina Grills, MD;  Location: [facility] OR;  Service: ENT;  Laterality: N/A;   ESOPHAGOGASTRODUODENOSCOPY (EGD) WITH PROPOFOL N/A 09/25/2023   Procedure: ESOPHAGOGASTRODUODENOSCOPY (EGD) WITH PROPOFOL;  Surgeon: Albertina Hugger, MD;  Location: [facility] ENDOSCOPY;  Service: Gastroenterology;  Laterality: N/A;  EGD needs to be coordinated with TEE.  Patient has severe aortic stenosis, procedures need to be done to follow each other with 1 sedation   IMPLANTATION OF HYPOGLOSSAL NERVE STIMULATOR Right 10/01/2021   Procedure: IMPLANTATION OF HYPOGLOSSAL NERVE STIMULATOR;  Surgeon: Virgina Grills, MD;  Location: [facility] OR;  Service: ENT;  Laterality: Right;   INGUINAL HERNIA REPAIR  2003   right    KNEE ARTHROSCOPY Right 03/03/2016   LUMBAR DISC SURGERY  03/1995   Dr. Burr Cary, discectomy   LUMBAR LAMINECTOMY/DECOMPRESSION MICRODISCECTOMY Right 10/06/2016   Procedure: RIGHT LUMBAR THREE - LUMBAR FOUR  LAMINECTOMY, FORAMINOTOMY AND MICRODISCECTOMY;  Surgeon: Yvonna Herder, MD;  Location: [facility] OR;  Service: Neurosurgery;  Laterality: Right;   SHOULDER SURGERY  11/28/2005   left partial   SHOULDER SURGERY  07/14/2006   RIGHT   TEE WITHOUT CARDIOVERSION N/A 09/25/2023   Procedure: TRANSESOPHAGEAL ECHOCARDIOGRAM (TEE);  Surgeon: Mardell Shade, MD;  Location: [facility] ENDOSCOPY;  Service: Cardiovascular;  Laterality: N/A;   TONSILLECTOMY  AGE 5 OR 4   TOTAL KNEE ARTHROPLASTY Right 03/07/2022   Procedure: RIGHT TOTAL KNEE ARTHROPLASTY;  Surgeon: Wendolyn Hamburger, MD;  Location: [facility] ORS;  Service: Orthopedics;  Laterality: Right;   TOTAL KNEE ARTHROPLASTY Left 06/06/2022   Procedure: LEFT TOTAL KNEE ARTHROPLASTY;  Surgeon: Wendolyn Hamburger, MD;  Location: [facility] ORS;  Service: Orthopedics;  Laterality: Left;   TOTAL SHOULDER ARTHROPLASTY Right 11/01/2018   Procedure: RIGHT SHOULDER REVISION TO  REVERSE TOTAL SHOULDER;  Surgeon: Sammye Cristal, MD;  Location: [facility] ORS;  Service: Orthopedics;  Laterality: Right;  CHOICE ANESTHESIA WITH INTERSCALENE BLOCK EXPAREL, NEEDS RNFA   HPI:  Octavis Sheeler is a 73 y.o. male who presented from SNF with altered mental status and nausea vomiting and dark stool. CT head and CXR 4/11 with no acute findings. Pt with medical history significant of hypertension, hyperlipidemia, diabetes, paroxysmal A-fib, paroxysmal tachycardia, GERD, COPD/asthma, chronic diastolic CHF, NAFLD, cirrhosis, portal hypertension, obesity, OSA, BPH,    Assessment / Plan / Recommendation  Clinical Impression  Pt presents with clinical indicators of pharyngeal dysphagia.  Pt was assessed only with clear liquids 2/2 GIB. On arrival there were dark dried secretions throughout oral cavity on tongue, palate, teeth and lips.  SLP provided aggressive, thourough oral care and secretionswere fairly easy to clear.  With straw sips of thin liquid pt was noted to be somewhat impulsive with large serial sips.  On 2nd trial there was immediate, prolonged, wet, multicough epoch.  There was one additional mild throat clear with water during assessment.  Pt required multiple swallows with both water and gelatin fairly consistently. Ex wife present for evaluation.  She has no received reports of pneumonia and pt does not consume modified textures at baseline.  Pt seen by SLP at [facility] in January with clinical recommendations for regular texture diet with thin liquid and good tolerance of these consistencies. Pt's chest imaging without acute findings.  Pt with AMS which has been improving, but he removed NGT earlier today.  With consideration for risk of bleeding with replacement and potential removal of NGT, and hopeful continued improvement in mentation, recommend initiating oral diet of clear liquids per GI recommendations.  SLP will continue to closely monitor swallow function clinically with possible instrumental  swallow evaluation should symptoms persist.     Recommend clear liquid diet, thin liquids. Recommend administering medicaitons whole or crushed with gelatin.   SLP Visit Diagnosis: Dysphagia, unspecified (R13.10)    Aspiration Risk  Mild aspiration risk    Diet Recommendation Thin liquid    Medication Administration:  (Meds with gelatin, whole or crushed) Supervision: Staff to assist with self feeding Compensations: Slow rate;Small sips/bites Postural Changes: Seated upright at 90 degrees    Other  Recommendations Oral Care Recommendations: Oral care BID    Recommendations for follow up therapy are one component of a multi-disciplinary discharge planning process, led by the attending physician.  Recommendations may be updated based on patient status, additional functional criteria and insurance authorization.  Follow up Recommendations  (ST at next level of care at present)      Assistance Recommended at Discharge  N/A  Functional Status Assessment Patient has had a recent decline in their functional status and demonstrates the ability to make significant improvements in function in a reasonable and predictable amount of time.  Frequency and Duration min 2x/week  2 weeks       Prognosis Prognosis for improved  oropharyngeal function: Good      Swallow Study   General Date of Onset: 12/08/23 HPI: Owyn Raulston is a 73 y.o. male who presented from SNF with altered mental status and nausea vomiting and dark stool. CT head and CXR 4/11 with no acute findings. Pt with medical history significant of hypertension, hyperlipidemia, diabetes, paroxysmal A-fib, paroxysmal tachycardia, GERD, COPD/asthma, chronic diastolic CHF, NAFLD, cirrhosis, portal hypertension, obesity, OSA, BPH, Previous Swallow Assessment: Clinical in [place] 1/23-24/25 with recs for reg/thin Diet Prior to this Study: NPO Temperature Spikes Noted: No Respiratory Status: Room air History of Recent Intubation:  No Behavior/Cognition: Alert;Cooperative Oral Cavity Assessment: Dried secretions;Excessive secretions Oral Care Completed by SLP: Yes Oral Cavity - Dentition: Adequate natural dentition Patient Positioning: Upright in bed Baseline Vocal Quality: Not observed Volitional Cough:  (Fair) Volitional Swallow: Unable to elicit    Oral/Motor/Sensory Function Overall Oral Motor/Sensory Function: Mild impairment Facial ROM: Reduced right;Reduced left Facial Symmetry: Within Functional Limits Lingual ROM: Reduced right;Reduced left Lingual Symmetry: Within Functional Limits Lingual Strength: Reduced Velum: Within Functional Limits Mandible: Within Functional Limits   Ice Chips Ice chips: Not tested   Thin Liquid Thin Liquid: Impaired Pharyngeal  Phase Impairments: Multiple swallows;Cough - Immediate;Wet Vocal Quality    Nectar Thick Nectar Thick Liquid: Not tested   Honey Thick Honey Thick Liquid: Not tested   Puree Puree: Not tested   Solid     Solid: Not tested      Elester Grim, MA, CCC-SLP Acute Rehabilitation Services Office: [phone] 12/10/2023,3:28 PM

## 2023-12-10 NOTE — Social Work (Signed)
 CSW attempted to call patients family member [name] in chart but there was no answer. Pt unable to participate in assessment at this time, he is disoriented x4. CSW did confirm that pt is from SNF at [facility]. Admissions coordinator stated pt is welcome to return as long as insurance [employer] is approved.   Adline Hook, LCSW Clinical Social Worker

## 2023-12-10 NOTE — Progress Notes (Addendum)
 Patient ID: Tony Cantu, male   DOB: 1951-02-21, 74 y.o.   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  Received high-dose lactulose via NG tube yesterday and lactulose enema  Venous ammonia 154 yesterday> 61 today INR improved 1.3 today Potassium 2.9/BUN 6/creatinine 0.86 LFTs normal/AST 46  Patient is starting to wake up, did not open eyes to conversation this morning but did not yes and no and mumbled hello when I spoke to him, nods head no to any abdominal pain or other pain.  He was moving about in the bed, has mittens on    Objective   Vital signs in last 24 hours: Temp:  [97.8 F (36.6 C)-98.5 F (36.9 C)] 98.3 F (36.8 C) (04/13 0801) Pulse Rate:  [69-84] 71 (04/13 0801) Resp:  [12-20] 18 (04/13 0801) BP: (85-129)/(52-96) 88/52 (04/13 0801) SpO2:  [93 %-99 %] 94 % (04/13 0801) Last BM Date : 12/09/23 General: [name] in NAD, encephalopathic but improving, moving in the bed nodding appropriately, NG tube in place Heart:  Regular rate and rhythm; no murmurs Lungs: Respirations even and unlabored, lungs CTA bilaterally Abdomen:  Soft, nontender and nondistended. Normal bowel sounds. Extremities:  Without edema. Neurologic: Arousable to voice, nodding appropriately mumbled hello, moving about in the bed improved over yesterday Psych:  Cooperative.  Intake/Output from previous day: 04/12 0701 - 04/13 0700 In: 1502.2 [I.V.:1019; NG/GT:100; IV Piggyback:383.2] Out: 1400 [Urine:1400] Intake/Output this shift: No intake/output data recorded.  Lab  Results: Recent Labs    12/09/23 0038 12/09/23 0648 12/10/23 0813  WBC 4.7 5.7 6.4  HGB 10.5* 10.6* 10.4*  HCT 31.1* 31.6* 31.5*  PLT 145* 161 155   BMET Recent Labs    12/08/23 1332 12/08/23 1406 12/09/23 0648 12/10/23 0813  NA 142 142 144 145  K 3.4* 3.3* 3.1* 2.9*  CL 110  --  112* 114*  CO2 22  --  22 22  GLUCOSE 182*  --  117* 293*  BUN 11  --  8 6*  CREATININE 0.85  --  0.85 0.86  CALCIUM 9.1  --  9.3 9.1   LFT Recent Labs    12/10/23 0813  PROT 5.5*  ALBUMIN 2.2*  AST 46*  ALT 41  ALKPHOS 80  BILITOT 0.9   PT/INR Recent Labs    12/08/23 1705 12/10/23 0813  LABPROT 21.8* 16.1*  INR 1.9* 1.3*    Studies/Results: DG Abd 1 View Result Date: 12/08/2023 CLINICAL DATA:  NG tube placement EXAM: ABDOMEN - 1 VIEW COMPARISON:  None Available. FINDINGS: NG tube is in the proximal to mid stomach. Nonobstructive bowel gas pattern. IMPRESSION: NG tube in the stomach. Electronically Signed   By: Janeece Mechanic M.D.   On: 12/08/2023 23:14   CT ABDOMEN PELVIS W CONTRAST Result Date: 12/08/2023 CLINICAL DATA:  Abdominal pain, acute, nonlocalized EXAM: CT ABDOMEN AND PELVIS WITH CONTRAST TECHNIQUE: Multidetector CT imaging of the abdomen and pelvis was performed using the standard protocol following bolus administration of intravenous contrast. RADIATION DOSE REDUCTION: This exam was performed according to the departmental dose-optimization program which includes automated exposure control, adjustment of the mA  and/or kV according to patient size and/or use of iterative reconstruction technique. CONTRAST:  75mL OMNIPAQUE IOHEXOL 350 MG/ML SOLN COMPARISON:  CT scan abdomen and pelvis from 09/19/2023. FINDINGS: Lower chest: The lung bases are clear. No pleural effusion. The heart is normal in size. No pericardial effusion. Dense mitral annulus and aortic valve calcifications noted. Hepatobiliary: The liver is normal in size. Cirrhotic liver configuration. No focal mass seen within  the limitations of this exam. No intrahepatic or extrahepatic bile duct dilation. No calcified gallstones. Normal gallbladder Gartin thickness. No pericholecystic inflammatory changes. Pancreas: Small/atrophic pancreas.  No discrete focal mass seen. Spleen: Within normal limits. No focal lesion. Adrenals/Urinary Tract: Unremarkable right adrenal gland. Left adrenal gland is partially visualized surrounded by multiple venous collaterals. No discrete focal mass seen. No suspicious renal mass. There are multiple bilateral simple renal cysts. There are at least 3, 2-3 mm nonobstructing calculi in the left kidney and at least 1, 1 mm sized nonobstructing calculus in the right kidney. No ureterolithiasis or obstructive uropathy. Unremarkable urinary bladder. Stomach/Bowel: No disproportionate dilation of the small or large bowel loops. Unremarkable appendix. There is mild circumferential thickening of the rectum with mild to moderate perirectal fat stranding and trace presacral fluid, favoring proctitis. Correlate clinically. Vascular/Lymphatic: No ascites or pneumoperitoneum. No abdominal or pelvic lymphadenopathy, by size criteria. No aneurysmal dilation of the major abdominal arteries. There are mild peripheral atherosclerotic vascular calcifications of the aorta and its major branches. Reproductive: Normal size prostate. Symmetric seminal vesicles. Other: There is a tiny fat containing umbilical hernia. The soft tissues and abdominal Abrigo are otherwise unremarkable. Musculoskeletal: No suspicious osseous lesions. There are mild - moderate multilevel degenerative changes in the visualized spine. IMPRESSION: 1. Mild circumferential thickening of the rectum with mild to moderate perirectal fat stranding and trace presacral fluid, favoring proctitis. 2. Otherwise, no acute inflammatory process identified within the abdomen or pelvis. 3. Cirrhotic liver with multiple venous collaterals, suggesting sequela of portal  hypertension. No splenomegaly or ascites. 4. Multiple other nonacute observations, as described above. Aortic Atherosclerosis (ICD10-I70.0). Electronically Signed   By: Beula Brunswick M.D.   On: 12/08/2023 16:50   CT HEAD WO CONTRAST ( ) Result Date: 12/08/2023 CLINICAL DATA:  Provided history: Mental status change, persistent or worsening. Altered mental status. On blood thinners. Fatigue. EXAM: CT HEAD WITHOUT CONTRAST TECHNIQUE: Contiguous axial images were obtained from the base of the skull through the vertex without intravenous contrast. RADIATION DOSE REDUCTION: This exam was performed according to the departmental dose-optimization program which includes automated exposure control, adjustment of the mA and/or kV according to patient size and/or use of iterative reconstruction technique. COMPARISON:  Brain MRI 09/20/2023. Head CT 09/20/2023. FINDINGS: Brain: Generalized parenchymal atrophy. Prominent perivascular space versus chronic lacunar infarct again demonstrated within the right frontal lobe centrum semiovale (series 6, image 22). Mild patchy and ill-defined hypoattenuation elsewhere within the cerebral white matter, nonspecific but compatible with chronic small vessel disease. There is no acute intracranial hemorrhage. No demarcated cortical infarct. No extra-axial fluid collection. No evidence of an intracranial mass. No midline shift. Vascular: No hyperdense vessel.  Atherosclerotic calcifications. Skull: No calvarial fracture or aggressive osseous lesion. Sinuses/Orbits: No mass or acute finding within the imaged orbits. No significant paranasal sinus disease. Other: Surgical fixation hardware along the nasal bones, bilaterally. IMPRESSION: 1. No evidence of an acute intracranial abnormality. 2. Prominent perivascular space versus chronic lacunar infarct within the right frontal lobe centrum semiovale, unchanged. 3. Background mild cerebral white matter chronic small vessel  ischemic disease. 4.  Generalized parenchymal atrophy. Electronically Signed   By: Bascom Lily D.O.   On: 12/08/2023 16:43   DG Chest Portable 1 View Result Date: 12/08/2023 CLINICAL DATA:  Altered mental status. EXAM: PORTABLE CHEST 1 VIEW COMPARISON:  Chest radiograph dated 10/24/2023. FINDINGS: Stable cardiomediastinal silhouette. Aortic atherosclerosis. Low lung volumes. No focal consolidation, sizeable pleural effusion, or pneumothorax. Stable right chest generator device. Prior reverse right shoulder arthroplasty. No acute osseous abnormality. IMPRESSION: Low lung volumes.  No acute cardiopulmonary findings. Electronically Signed   By: Mannie Seek M.D.   On: 12/08/2023 15:10       Assessment / Plan:     #18 73 year old white male with multiple comorbid conditions admitted from his nursing facility with lethargy report of dark stools over the past few days and probable coffee-ground emesis.  Hemodynamically stable on arrival but clearly encephalopathic/obtunded and unable to participate. Documented melenic stool on exam On Eliquis chronically for atrial fibrillation-now on hold and was reversed in the ER secondary to concerns with GI bleeding  Lactulose enema yesterday and now has been receiving high-dose lactulose per NG tube over the past 24 hours Still clearly encephalopathic but improved over yesterday  Encephalopathy likely precipitated by GI bleeding  Hemoglobin stable at 10.4, no further active bleeding after reversal of Eliquis INR normalizing  #2 history of MASH cirrhosis-recent EGD (January 25) no evidence of esophageal or gastric varices does have portal gastropathy, AVMs 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  Insulin-dependent diabetes mellitus 7.  Hypertension 8.  Seizure disorder 9.  Recent treatment for probable endocarditis   Plan; Continue high-dose  lactulose today via NG 30 g 4 times daily Hopefully he may be alert enough by tomorrow to be able to start taking p.o.'s Continue twice daily PPI Serial hemoglobins, transfuse as indicated  Will need eventual EGD repeated this admission but not urgent as no active bleeding off Eliquis Given underlying etiology of bleeding with portal gastropathy and GAVE they need to consider leaving him off of Eliquis permanently  GI will continue to follow with you     Principal Problem:   Acute GI bleeding Active Problems:   Asthma   Obesity   OSA (obstructive sleep apnea)   Diabetes type 2, controlled (HCC)   BPH (benign prostatic hyperplasia)   Paroxysmal tachycardia (HCC)   Hypertension associated with type 2 diabetes mellitus (HCC)   Hyperlipidemia associated with type 2 diabetes mellitus (HCC)   GERD with esophagitis   PAF (paroxysmal atrial fibrillation) (HCC)   Grade I diastolic dysfunction   Chronic obstructive pulmonary disease (HCC)   NAFLD (nonalcoholic fatty liver disease)   Portal hypertension (HCC)   Melena   Other cirrhosis of liver (HCC)   Hepatic encephalopathy (HCC)   ABLA (acute blood loss anemia)     LOS: 2 days   Amy EsterwoodPA-C  12/10/2023, 10:09 AM  I have taken an interval history, thoroughly reviewed the chart and examined the patient. I agree with the Advanced Practitioner's note, impression and recommendations, and have recorded additional findings, impressions and recommendations below. I performed a substantive portion of this encounter (>50% time spent), including a complete performance of the medical decision making.  My additional thoughts are as follows:  Hepatic encephalopathy slowly improving after aggressive administration of lactulose and rifaximin. Bleeding seems to stop at least 24 hours ago after reversal of  anticoagulation. Continue current management, eventual EGD with Dr. Rosaline Coma this week, timing TBD based on patient's clinical  progress.   Kerby Pearson III [phone]

## 2023-12-11 ENCOUNTER — Encounter: Payer: Self-pay | Admitting: Hematology

## 2023-12-11 ENCOUNTER — Telehealth (HOSPITAL_COMMUNITY): Payer: Self-pay

## 2023-12-11 ENCOUNTER — Inpatient Hospital Stay (HOSPITAL_COMMUNITY)

## 2023-12-11 ENCOUNTER — Other Ambulatory Visit: Payer: Self-pay

## 2023-12-11 ENCOUNTER — Other Ambulatory Visit (HOSPITAL_COMMUNITY): Payer: Self-pay

## 2023-12-11 DIAGNOSIS — K7682 Hepatic encephalopathy: Secondary | ICD-10-CM | POA: Diagnosis not present

## 2023-12-11 DIAGNOSIS — D62 Acute posthemorrhagic anemia: Secondary | ICD-10-CM | POA: Diagnosis not present

## 2023-12-11 DIAGNOSIS — K7460 Unspecified cirrhosis of liver: Secondary | ICD-10-CM | POA: Diagnosis not present

## 2023-12-11 DIAGNOSIS — K921 Melena: Secondary | ICD-10-CM | POA: Diagnosis not present

## 2023-12-11 DIAGNOSIS — K3189 Other diseases of stomach and duodenum: Secondary | ICD-10-CM | POA: Diagnosis not present

## 2023-12-11 DIAGNOSIS — K7581 Nonalcoholic steatohepatitis (NASH): Secondary | ICD-10-CM | POA: Diagnosis not present

## 2023-12-11 DIAGNOSIS — K7469 Other cirrhosis of liver: Secondary | ICD-10-CM | POA: Diagnosis not present

## 2023-12-11 DIAGNOSIS — K766 Portal hypertension: Secondary | ICD-10-CM | POA: Diagnosis not present

## 2023-12-11 LAB — BRAIN NATRIURETIC PEPTIDE: B Natriuretic Peptide: 351.3 pg/mL — ABNORMAL HIGH (ref 0.0–100.0)

## 2023-12-11 LAB — COMPREHENSIVE METABOLIC PANEL WITH GFR
ALT: 47 U/L — ABNORMAL HIGH (ref 0–44)
AST: 50 U/L — ABNORMAL HIGH (ref 15–41)
Albumin: 2.3 g/dL — ABNORMAL LOW (ref 3.5–5.0)
Alkaline Phosphatase: 92 U/L (ref 38–126)
Anion gap: 8 (ref 5–15)
BUN: 7 mg/dL — ABNORMAL LOW (ref 8–23)
CO2: 22 mmol/L (ref 22–32)
Calcium: 9.7 mg/dL (ref 8.9–10.3)
Chloride: 119 mmol/L — ABNORMAL HIGH (ref 98–111)
Creatinine, Ser: 1.1 mg/dL (ref 0.61–1.24)
GFR, Estimated: >60 mL/min (ref >60)
Glucose, Bld: 327 mg/dL — ABNORMAL HIGH (ref 70–99)
Potassium: 3.9 mmol/L (ref 3.5–5.1)
Sodium: 149 mmol/L — ABNORMAL HIGH (ref 135–145)
Total Bilirubin: 1.1 mg/dL (ref 0.0–1.2)
Total Protein: 5.7 g/dL — ABNORMAL LOW (ref 6.5–8.1)

## 2023-12-11 LAB — GLUCOSE, CAPILLARY
Glucose-Capillary: 357 mg/dL — ABNORMAL HIGH (ref 70–99)
Glucose-Capillary: 465 mg/dL — ABNORMAL HIGH (ref 70–99)
Glucose-Capillary: 283 mg/dL — ABNORMAL HIGH (ref 70–99)
Glucose-Capillary: 160 mg/dL — ABNORMAL HIGH (ref 70–99)
Glucose-Capillary: 159 mg/dL — ABNORMAL HIGH (ref 70–99)

## 2023-12-11 LAB — CBC WITH DIFFERENTIAL/PLATELET
Abs Immature Granulocytes: 0.04 10*3/uL (ref 0.00–0.07)
Basophils Absolute: 0 10*3/uL (ref 0.0–0.1)
Basophils Relative: 0 %
Eosinophils Absolute: 0 10*3/uL (ref 0.0–0.5)
Eosinophils Relative: 0 %
HCT: 34.4 % — ABNORMAL LOW (ref 39.0–52.0)
Hemoglobin: 11.1 g/dL — ABNORMAL LOW (ref 13.0–17.0)
Immature Granulocytes: 0 %
Lymphocytes Relative: 14 %
Lymphs Abs: 1.3 10*3/uL (ref 0.7–4.0)
MCH: 35.2 pg — ABNORMAL HIGH (ref 26.0–34.0)
MCHC: 32.3 g/dL (ref 30.0–36.0)
MCV: 109.2 fL — ABNORMAL HIGH (ref 80.0–100.0)
Monocytes Absolute: 1.1 10*3/uL — ABNORMAL HIGH (ref 0.1–1.0)
Monocytes Relative: 13 %
Neutro Abs: 6.4 10*3/uL (ref 1.7–7.7)
Neutrophils Relative %: 73 %
Platelets: 172 10*3/uL (ref 150–400)
RBC: 3.15 MIL/uL — ABNORMAL LOW (ref 4.22–5.81)
RDW: 17.9 % — ABNORMAL HIGH (ref 11.5–15.5)
WBC: 8.9 10*3/uL (ref 4.0–10.5)
nRBC: 0 % (ref 0.0–0.2)

## 2023-12-11 LAB — AMMONIA: Ammonia: 55 umol/L — ABNORMAL HIGH (ref 9–35)

## 2023-12-11 LAB — MAGNESIUM: Magnesium: 2 mg/dL (ref 1.7–2.4)

## 2023-12-11 MED ORDER — INSULIN ASPART 100 UNIT/ML IJ SOLN: 0.0000 [IU] | Freq: Three times a day (TID) | INTRAMUSCULAR | Status: DC

## 2023-12-11 MED ORDER — INSULIN GLARGINE-YFGN 100 UNIT/ML ~~LOC~~ SOLN
18.0000 [IU] | Freq: Two times a day (BID) | SUBCUTANEOUS | Status: DC
  Administered 2023-12-11 (×2): 18 [IU] via SUBCUTANEOUS
  Filled 2023-12-11 (×3): qty 0.18

## 2023-12-11 MED ORDER — INSULIN ASPART 100 UNIT/ML IJ SOLN: 0.0000 [IU] | Freq: Every day | INTRAMUSCULAR | Status: DC

## 2023-12-11 MED ORDER — INSULIN ASPART 100 UNIT/ML IJ SOLN
0.0000 [IU] | INTRAMUSCULAR | Status: DC
  Administered 2023-12-11: 15 [IU] via SUBCUTANEOUS
  Administered 2023-12-11: 8 [IU] via SUBCUTANEOUS
  Administered 2023-12-11 – 2023-12-12 (×2): 3 [IU] via SUBCUTANEOUS

## 2023-12-11 MED ORDER — LACTULOSE 10 GM/15ML PO SOLN
30.0000 g | Freq: Four times a day (QID) | ORAL | Status: DC
  Administered 2023-12-11 – 2023-12-12 (×5): 30 g via ORAL
  Filled 2023-12-11 (×5): qty 60

## 2023-12-11 MED ORDER — DEXTROSE 5 % IV SOLN: INTRAVENOUS | Status: AC

## 2023-12-11 MED ORDER — POTASSIUM CHLORIDE 10 MEQ/100ML IV SOLN
10.0000 meq | INTRAVENOUS | Status: AC
  Administered 2023-12-11 (×2): 10 meq via INTRAVENOUS
  Filled 2023-12-11 (×2): qty 100

## 2023-12-11 MED ORDER — INSULIN GLARGINE-YFGN 100 UNIT/ML ~~LOC~~ SOLN
15.0000 [IU] | Freq: Two times a day (BID) | SUBCUTANEOUS | Status: DC
  Filled 2023-12-11: qty 0.15

## 2023-12-11 NOTE — Progress Notes (Signed)
 PROGRESS NOTE                                                                                                                                                                                                             Patient Demographics:    Tony Cantu, is a 73 y.o. male, DOB - 07-09-1951, ZOX:[id number]  Outpatient Primary MD for the patient is [name], DO    LOS - 3  Admit date - 12/08/2023    Chief Complaint  Patient presents with   Fatigue       Brief Narrative (HPI from H&P)    73 y.o. male with medical history significant of hypertension, hyperlipidemia, diabetes, paroxysmal A-fib, paroxysmal tachycardia, GERD, COPD/asthma, chronic diastolic CHF, NAFLD, cirrhosis, portal hypertension, obesity, OSA, BPH, presenting with altered mental status and nausea vomiting and dark stool.   History team with assistance of chart review and EMS report.  Patient reportedly at facility after fall.  Patient has been acting differently for the past 1 to 2 days.  He has had increased fatigue and somnolence.  Reportedly has had nausea and vomiting with black emesis and now black stools.  He will nod to answer questions but has not been speaking.  Further workup was consistent with severe hepatic encephalopathy and possible upper GI bleed, GI was consulted and he was admitted.   Subjective:   Patient in bed, appears comfortable, denies any headache, no fever, no chest pain or pressure, no shortness of breath , no abdominal pain. No new focal weakness.    Assessment  & Plan :   UGI bleed in a patient with  [name], cirrhosis, portal hypertension and gastric varices >  History of cirrhosis with portal hypertension/varices, with melena as the presenting symptom. > GI consulted and have seen the patient and recommend IV PPI, IV octreotide, Eliquis reversed in the ER, has NG tube n.p.o., continue to monitor CBC appears to be staying  stable, seems to be stabilizing clinically.   Severe hepatic encephalopathy with underlying history of [place]  > improving with high-dose lactulose, Xifaxan, Monia level is trending down, mentation continues to improve, no focal deficits, head CT stable.   Hypertension  - Not on any antihypertensives   Hyperlipidemia  - Resume atorvastatin when tolerating p.o.   Paroxysmal atrial fibrillation  - Resume amiodarone when tolerating  p.o. with underlying history of [name] will defer it to his primary cardiologist on long-term use of amiodarone.   Eliquis held/reversed as above.  As needed IV Cardizem   GERD  - On IV PPI   COPD/asthma  - Resume Singulair and tolerating p.o.  Not on inhalers, no acute issues   Chronic diastolic CHF  > Last echocardiogram, Was TEE in January 2025 which showed EF 60-65%, normal RV function. > Not on diuretic,  Continue to monitor   QTc prolongation > With underlying right bundle branch block, hard to discern correct QTc.  Place electrolytes and monitor on telemetry.   Hypernatremia.  Gentle D5W and monitor.    Severe hypokalemia.  Replaced.    Diabetes  -D5W while n.p.o. and hypernatremic, placed on Lantus twice daily and SSI modified for better control  Lab Results  Component Value Date   HGBA1C 4.4 (L) 09/19/2023   CBG (last 3)  Recent Labs    12/10/23 1956 12/10/23 2334 12/11/23 0753  GLUCAP 192* 305* 357*         Condition - Extremely Guarded  Family Communication  : Ex-wife and primary decision maker is [name] [phone] in detail on 12/09/2023, 12/10/2023  Code Status : DNR  Consults  :  GI  PUD Prophylaxis : PPI   Procedures  :     CT head nonacute.    CT   abdomen pelvis.   1. Mild circumferential thickening of the rectum with mild to moderate perirectal fat stranding and trace presacral fluid, favoring proctitis. 2. Otherwise, no acute inflammatory process identified within the abdomen or pelvis. 3. Cirrhotic liver with multiple  venous collaterals, suggesting sequela of portal hypertension. No splenomegaly or ascites. 4. Multiple other nonacute observations, as described above. Aortic Atherosclerosis (ICD10-I70.0).      Disposition Plan  :    Status is: Inpatient  DVT Prophylaxis  :    Place and maintain sequential compression device Start: 12/09/23 0555 SCDs Start: 12/08/23 1707    Lab Results  Component Value Date   PLT 172 12/11/2023    Diet :  Diet Order             Diet clear liquid Room service appropriate? Yes; Fluid consistency: Thin  Diet effective now                    Inpatient Medications  Scheduled Meds:  insulin aspart  0-15 Units Subcutaneous Q4H   insulin glargine-yfgn  18 Units Subcutaneous BID   lactulose  30 g Per Tube QID   pantoprazole (PROTONIX) IV  40 mg Intravenous Q12H   rifaximin  550 mg Oral BID   sodium chloride flush  3 mL Intravenous Q12H   Continuous Infusions:  dextrose 50 mL/hr at 12/11/23 0615   potassium chloride 10 mEq (12/11/23 0628)   PRN Meds:.acetaminophen **OR** acetaminophen, diltiazem  Antibiotics  :    Anti-infectives (From admission, onward)    Start     Dose/Rate Route Frequency Ordered Stop   12/10/23 2200  rifaximin (XIFAXAN) tablet 550 mg        550 mg Oral 2 times daily 12/10/23 1815     12/09/23 1000  rifaximin (XIFAXAN) tablet 550 mg  Status:  Discontinued        550 mg Per Tube 2 times daily 12/09/23 0827 12/10/23 1815         Objective:   Vitals:   12/10/23 2000 12/11/23 0000 12/11/23 0400 12/11/23 0754  BP:  116/89 110/64 131/68 114/60  Pulse: 99 100 69 78  Resp: 19 17 16 14   Temp: 97.8 F (36.6 C) 98 F (36.7 C) 98.2 F (36.8 C) 98.8 F (37.1 C)  TempSrc: Oral Axillary Axillary Axillary  SpO2: 97% 99% 95% 100%  Weight:      Height:        Wt Readings from Last 3 Encounters:  12/09/23 75.1 kg  11/14/23 98.4 kg  11/03/23 85.2 kg     Intake/Output Summary (Last 24 hours) at 12/11/2023 0848 Last data  filed at 12/11/2023 0500 Gross per 24 hour  Intake 2245 ml  Output 1050 ml  Net 1195 ml     Physical Exam  Awake, no focal deficits, more alert and only minimally confused now, no focal deficits,   [place].AT,PERRAL Supple Neck, No JVD,   Symmetrical Chest Murakami movement, Good air movement bilaterally, CTAB RRR,No Gallops,Rubs or new Murmurs,  +ve B.Sounds, Abd Soft, No tenderness,   No Cyanosis, Clubbing or edema      Data Review:    Recent Labs  Lab 12/04/23 1216 12/08/23 1332 12/08/23 1406 12/08/23 1843 12/09/23 0038 12/09/23 0648 12/10/23 0813 12/11/23 0331  WBC 4.9 5.8  --  5.3 4.7 5.7 6.4 8.9  HGB 13.2 10.6*   < > 10.8* 10.5* 10.6* 10.4* 11.1*  HCT 40.3 32.4*   < > 32.0* 31.1* 31.6* 31.5* 34.4*  PLT 149* 161  --  145* 145* 161 155 172  MCV 107.2* 106.2*  --  104.6* 103.7* 104.3* 106.1* 109.2*  MCH 35.1* 34.8*  --  35.3* 35.0* 35.0* 35.0* 35.2*  MCHC 32.8 32.7  --  33.8 33.8 33.5 33.0 32.3  RDW 17.0* 17.3*  --  17.2* 17.3* 17.3* 17.7* 17.9*  LYMPHSABS 1.0 1.1  --   --   --   --  1.2 1.3  MONOABS 0.6 0.7  --   --   --   --  0.7 1.1*  EOSABS 0.1 0.0  --   --   --   --  0.1 0.0  BASOSABS 0.1 0.0  --   --   --   --  0.1 0.0   < > = values in this interval not displayed.    Recent Labs  Lab 12/04/23 1216 12/08/23 1332 12/08/23 1406 12/08/23 1705 12/08/23 1843 12/09/23 0648 12/10/23 0813 12/11/23 0331  NA 136 142 142  --   --  144 145 149*  K 3.5 3.4* 3.3*  --   --  3.1* 2.9* 3.9  CL 103 110  --   --   --  112* 114* 119*  CO2 21* 22  --   --   --  22 22 22   ANIONGAP 12 10  --   --   --  10 9 8   GLUCOSE 368* 182*  --   --   --  117* 293* 327*  BUN 15 11  --   --   --  8 6* 7*  CREATININE 0.87 0.85  --   --   --  0.85 0.86 1.10  AST 48* 75*  --   --   --  64* 46* 50*  ALT 42 47*  --   --   --  45* 41 47*  ALKPHOS 118 90  --   --   --  90 80 92  BILITOT 1.1 0.9  --   --   --  1.1 0.9 1.1  ALBUMIN 2.7* 2.4*  --   --   --  2.4* 2.2* 2.3*  LATICACIDVEN  --   --   1.6  --   --   --   --   --   INR  --   --   --  1.9*  --   --  1.3*  --   TSH  --   --   --   --  1.469  --   --   --   AMMONIA  --  111*  --   --   --  154* 61* 55*  BNP  --   --   --   --   --  420.3* 423.0* 351.3*  MG  --   --   --   --   --  1.8 1.8 2.0  CALCIUM 9.3 9.1  --   --   --  9.3 9.1 9.7      Recent Labs  Lab 12/04/23 1216 12/08/23 1332 12/08/23 1406 12/08/23 1705 12/08/23 1843 12/09/23 0648 12/10/23 0813 12/11/23 0331  LATICACIDVEN  --   --  1.6  --   --   --   --   --   INR  --   --   --  1.9*  --   --  1.3*  --   TSH  --   --   --   --  1.469  --   --   --   AMMONIA  --  111*  --   --   --  154* 61* 55*  BNP  --   --   --   --   --  420.3* 423.0* 351.3*  MG  --   --   --   --   --  1.8 1.8 2.0  CALCIUM 9.3 9.1  --   --   --  9.3 9.1 9.7    --------------------------------------------------------------------------------------------------------------- Lab Results  Component Value Date   CHOL 126 01/04/2022   HDL 57 01/04/2022   LDLCALC 55 01/04/2022   TRIG 67 01/04/2022   CHOLHDL 2.2 01/04/2022    Lab Results  Component Value Date   HGBA1C 4.4 (L) 09/19/2023   Recent Labs    12/08/23 1843  TSH 1.469     Radiology Report DG Chest Port 1 View Result Date: 12/11/2023 CLINICAL DATA:  Shortness of breath EXAM: PORTABLE CHEST 1 VIEW COMPARISON:  12/08/2023 FINDINGS: Normal heart size and mediastinal contours. Elevated right diaphragm. There is no edema, consolidation, effusion, or pneumothorax. Bilateral glenohumeral arthroplasty and generator pack over the right chest. Extensive artifact from EKG leads. IMPRESSION: Stable exam.  No evidence of acute disease. Electronically Signed   By: Ronnette Coke M.D.   On: 12/11/2023 06:53     Signature  -   Lynnwood Sauer M.D on 12/11/2023 at 8:48 AM   -  To page go to [email or web address]

## 2023-12-11 NOTE — Progress Notes (Signed)
 Speech Language Pathology Treatment: Dysphagia  Patient Details Name: Tony Cantu MRN: [id number] DOB: 31-Jul-1951 Today's Date: 12/11/2023 Time: 0902-0919 SLP Time Calculation (min) (ACUTE ONLY): 17 min  Assessment / Plan / Recommendation Clinical Impression  Pt seen for ongoing dysphagia management.  No negative reports from nursing with clear liquid diet.  Pt asleep on SLP arrival but able to rouse.  SLP provided oral care.  Some dried secretions in oral cavity.  Still a think black-green coating on teeth.  Pt repositioned with RN assistance.  On initial trial of thin liquid by cup there was strong, wet cough with prolonged coughing epoch.  Pt continued to exhibit a more mild cough with water on about half of trials.  Pt took medication crushed in gelatin without difficulty.  He tolerated gelatin despite its thin liquid consistency where water and elixir medication continued to to elicit fairly frequent coughing. Pt consumed 4 oz of nectar thick liquid without any coughing, though double swallow is still noted. Pt would benefit from instrumental evaluation but given AMS he cannot consent to FEES and would prefer MBS 2/2 quasi-invasive nature of FEES, unfortunately, pt cannot have barium at this time given GIB.  Hopeful for continued improvement in swallow function with improvement in mentation, but will consider MBS when cleared for more advanced POs by GI.  Recommend clear liquid diet, nectar thick.    HPI HPI: Tony Cantu is a 73 y.o. male who presented from SNF with altered mental status and nausea vomiting and dark stool. CT head and CXR 4/11 with no acute findings. Pt with medical history significant of hypertension, hyperlipidemia, diabetes, paroxysmal A-fib, paroxysmal tachycardia, GERD, COPD/asthma, chronic diastolic CHF, NAFLD, cirrhosis, portal hypertension, obesity, OSA, BPH,      SLP Plan  Continue with current plan of care      Recommendations for follow up therapy are one  component of a multi-disciplinary discharge planning process, led by the attending physician.  Recommendations may be updated based on patient status, additional functional criteria and insurance authorization.    Recommendations  Diet recommendations: Nectar-thick liquid Liquids provided via: Cup;Straw Medication Administration:  (Crush with liquids) Compensations: Slow rate;Small sips/bites Postural Changes and/or Swallow Maneuvers: Seated upright 90 degrees                  Oral care BID     Dysphagia, unspecified (R13.10)     Continue with current plan of care     Tony Grim, MA, CCC-SLP Acute Rehabilitation Services Office: [phone] 12/11/2023, 9:23 AM

## 2023-12-11 NOTE — [place] FL2 (Signed)
 [place] MEDICAID FL2 LEVEL OF CARE FORM     IDENTIFICATION  Patient Name: Tony Cantu Birthdate: Nov 29, 1950 Sex: male Admission Date (Current Location): 12/08/2023  [facility] and [place] Number:  [employer] and Address:  [facility] [place], [address], [place], [place] [address]      Provider Number: [id number]  Attending Physician Name and Address:  Leroy Sea, MD  Relative Name and Phone Number:       Current Level of Care: Hospital Recommended Level of Care: Skilled Nursing Facility Prior Approval Number:    Date Approved/Denied:   PASRR Number: [id number]  Discharge Plan: SNF    Current Diagnoses: Patient Active Problem List   Diagnosis Date Noted   Hepatic encephalopathy (HCC) 12/09/2023   ABLA (acute blood loss anemia) 12/09/2023   Acute GI bleeding 12/08/2023   Atrial fibrillation with rapid ventricular response (HCC) 10/26/2023   Protein-calorie malnutrition, severe 10/25/2023   GI bleed 10/24/2023   Hemorrhagic shock (HCC) 10/24/2023   Other cirrhosis of liver (HCC) 09/25/2023   Abnormal finding on GI tract imaging 09/25/2023   Esophageal candidiasis (HCC) 09/25/2023   Bacteremia 09/21/2023   Acute encephalopathy 09/20/2023   Transaminitis 09/20/2023   Renal lesion 09/20/2023   Melena 09/20/2023   Iron deficiency anemia due to chronic blood loss 08/16/2023   NAFLD (nonalcoholic fatty liver disease) 78/46/9629   Portal hypertension (HCC) 08/24/2022   S/P total knee arthroplasty, left 06/06/2022   Degenerative arthritis of left knee 06/03/2022   Chronic obstructive pulmonary disease (HCC) 05/10/2022   Grade I diastolic dysfunction 03/11/2022   S/P TKR (total knee replacement), right 03/07/2022   Osteoarthritis of right knee 03/04/2022   Precordial chest pain 04/20/2021   Former smoker 01/12/2021   PAF (paroxysmal atrial fibrillation) (HCC) 06/22/2020   Hypertension associated with type 2 diabetes mellitus  (HCC) 05/17/2019   Hyperlipidemia associated with type 2 diabetes mellitus (HCC) 05/17/2019   GERD with esophagitis 05/17/2019   H/O total shoulder replacement, right 11/01/2018   Paroxysmal tachycardia (HCC) 01/11/2017   Nonrheumatic aortic valve stenosis 01/11/2017   Aortic atherosclerosis (HCC) 11/23/2016   HNP (herniated nucleus pulposus), lumbar 10/06/2016   Diarrhea 08/06/2016   Fever 08/06/2016   Hyperbilirubinemia 08/06/2016   Shock circulatory (HCC) 08/06/2016   Inguinal hernia 12/10/2015   Right groin pain 11/30/2015   Carpal tunnel syndrome on right 09/09/2015   Cervical spondylosis without myelopathy 09/09/2015   Thrombocytopenia (HCC) 07/21/2014   Bilateral carotid bruits 05/11/2014   Vitamin D deficiency 09/17/2013   BPH (benign prostatic hyperplasia) 05/22/2013   Low serum testosterone level 02/18/2013   Diabetes type 2, controlled (HCC) 01/10/2013   OSA (obstructive sleep apnea) 05/16/2012   Edema 02/21/2012   At risk for coronary artery disease 03/20/2011   Obesity 03/20/2011   Allergic rhinitis 06/22/2010   Asthma 06/22/2010   COUGH 06/22/2010    Orientation RESPIRATION BLADDER Height & Weight     Self, Place  Normal Incontinent, External catheter Weight: 165 lb 9.1 oz (75.1 kg) (per chart review from 3/18, RN to update when able) Height:  5' 7.99" (172.7 cm)  BEHAVIORAL SYMPTOMS/MOOD NEUROLOGICAL BOWEL NUTRITION STATUS      Incontinent Diet (See dc summary)  AMBULATORY STATUS COMMUNICATION OF NEEDS Skin   Extensive Assist Verbally Normal                       Personal Care Assistance Level of Assistance  Bathing, Feeding, Dressing  Bathing Assistance: Maximum assistance Feeding assistance: Limited assistance Dressing Assistance: Maximum assistance     Functional Limitations Info  Sight Sight Info: Impaired        SPECIAL CARE FACTORS FREQUENCY  PT (By licensed PT), OT (By licensed OT)     PT Frequency: 5x/week OT Frequency: 5x/week             Contractures Contractures Info: Not present    Additional Factors Info  Code Status, Allergies, Insulin Sliding Scale Code Status Info: DNR Allergies Info: [employer] [facility] Cold-cough, Ak-mycin (Erythromycin), Sulfonamide Derivatives   Insulin Sliding Scale Info: see dc summary       Current Medications (12/11/2023):  This is the current hospital active medication list Current Facility-Administered Medications  Medication Dose Route Frequency Provider Last Rate Last Admin   acetaminophen (TYLENOL) tablet 650 mg  650 mg Per Tube Q6H PRN Singh, Prashant K, MD       Or   acetaminophen (TYLENOL) suppository 650 mg  650 mg Rectal Q6H PRN Singh, Prashant K, MD       dextrose 5 % solution   Intravenous Continuous Singh, Prashant K, MD 50 mL/hr at 12/11/23 0615 New Bag at 12/11/23 0615   diltiazem (CARDIZEM) injection 10 mg  10 mg Intravenous Q6H PRN Singh, Prashant K, MD       insulin aspart (novoLOG) injection 0-15 Units  0-15 Units Subcutaneous Q4H Singh, Prashant K, MD   15 Units at 12/11/23 1158   insulin glargine-yfgn (SEMGLEE) injection 18 Units  18 Units Subcutaneous BID Singh, Prashant K, MD   18 Units at 12/11/23 0638   lactulose (CHRONULAC) 10 GM/15ML solution 30 g  30 g Oral QID Singh, Prashant K, MD   30 g at 12/11/23 1356   pantoprazole (PROTONIX) injection 40 mg  40 mg Intravenous Q12H Esterwood, Amy S, PA-C   40 mg at 12/11/23 1009   rifaximin (XIFAXAN) tablet 550 mg  550 mg Oral BID Singh, Prashant K, MD   550 mg at 12/11/23 0908   sodium chloride flush (NS) 0.9 % injection 3 mL  3 mL Intravenous Q12H Melvin, Alexander B, MD   3 mL at 12/11/23 0909     Discharge Medications: Please see discharge summary for a list of discharge medications.  Relevant Imaging Results:  Relevant Lab Results:   Additional Information [id number]  Zoe Creasman S Odilia Damico, LCSW

## 2023-12-11 NOTE — TOC Initial Note (Signed)
 Transition of Care [facility]) - Initial/Assessment Note    Patient Details  Name: Tony Cantu MRN: [id number] Date of Birth: [birth year]  Transition of [facility]) CM/SW Contact:    Mearl Latin, LCSW Phone Number: 12/11/2023, 3:45 PM  Clinical Narrative:                 CSW following; once medically stable, will request [facility] begin insurance process.  Expected Discharge Plan: Skilled Nursing Facility Barriers to Discharge: Continued Medical Work up, English as a second language teacher   Patient Goals and CMS Choice Patient states their goals for this hospitalization and ongoing recovery are:: Return to SNF CMS Medicare.gov Compare Post Acute Care list provided to:: Patient Represenative (must comment) Choice offered to / list presented to : [facility] POA / Guardian (ew-wife/hcpoa) [place] ownership interest in [facility].provided to:: [facility] POA / Guardian    Expected Discharge Plan and Services In-house Referral: Clinical Social Work   Post Acute Care Choice: Skilled Nursing Facility Living arrangements for the past 2 months: Single Family Home, Skilled Nursing Facility                                      Prior Living Arrangements/Services Living arrangements for the past 2 months: Single Family Home, Skilled Nursing Facility Lives with:: Self Patient language and need for interpreter reviewed:: Yes Do you feel safe going back to the place where you live?: Yes      Need for Family Participation in Patient Care: Yes (Comment) Care giver support system in place?: Yes (comment)   Criminal Activity/Legal Involvement Pertinent to Current Situation/Hospitalization: No - Comment as needed  Activities of Daily Living   ADL Screening (condition at time of admission) Independently performs ADLs?: No Does the patient have a NEW difficulty with bathing/dressing/toileting/self-feeding that is expected to last >3 days?: Yes (Initiates electronic notice to provider for possible OT  consult) Does the patient have a NEW difficulty with getting in/out of bed, walking, or climbing stairs that is expected to last >3 days?: Yes (Initiates electronic notice to provider for possible PT consult) Does the patient have a NEW difficulty with communication that is expected to last >3 days?: Yes (Initiates electronic notice to provider for possible SLP consult) Is the patient deaf or have difficulty hearing?: Yes Does the patient have difficulty seeing, even when wearing glasses/contacts?: Yes Does the patient have difficulty concentrating, remembering, or making decisions?: Yes  Permission Sought/Granted Permission sought to share information with : Facility Medical sales representative, Family Supports Permission granted to share information with : No  Share Information with NAME: Gerhard Rappaport  Permission granted to share info w AGENCY: [facility]  Permission granted to share info w Relationship: HCPOA  Permission granted to share info w Contact Information: [phone]  Emotional Assessment Appearance:: Appears stated age Attitude/Demeanor/Rapport: Unable to Assess Affect (typically observed): Unable to Assess Orientation: : Oriented to Self, Oriented to Place Alcohol / Substance Use: Not Applicable Psych Involvement: No (comment)  Admission diagnosis:  Hepatic encephalopathy (HCC) [K76.82] Acute GI bleeding [K92.2] Abdominal pain, unspecified abdominal location [R10.9] Altered mental status, unspecified altered mental status type [R41.82] Gastrointestinal hemorrhage, unspecified gastrointestinal hemorrhage type [K92.2] Patient Active Problem List   Diagnosis Date Noted   Hepatic encephalopathy (HCC) 12/09/2023   ABLA (acute blood loss anemia) 12/09/2023   Acute GI bleeding 12/08/2023   Atrial fibrillation with rapid ventricular response (HCC) 10/26/2023   Protein-calorie  malnutrition, severe 10/25/2023   GI bleed 10/24/2023   Hemorrhagic shock (HCC) 10/24/2023   Other cirrhosis of  liver (HCC) 09/25/2023   Abnormal finding on GI tract imaging 09/25/2023   Esophageal candidiasis (HCC) 09/25/2023   Bacteremia 09/21/2023   Acute encephalopathy 09/20/2023   Transaminitis 09/20/2023   Renal lesion 09/20/2023   Melena 09/20/2023   Iron deficiency anemia due to chronic blood loss 08/16/2023   NAFLD (nonalcoholic fatty liver disease) 16/05/9603   Portal hypertension (HCC) 08/24/2022   S/P total knee arthroplasty, left 06/06/2022   Degenerative arthritis of left knee 06/03/2022   Chronic obstructive pulmonary disease (HCC) 05/10/2022   Grade I diastolic dysfunction 03/11/2022   S/P TKR (total knee replacement), right 03/07/2022   Osteoarthritis of right knee 03/04/2022   Precordial chest pain 04/20/2021   Former smoker 01/12/2021   PAF (paroxysmal atrial fibrillation) (HCC) 06/22/2020   Hypertension associated with type 2 diabetes mellitus (HCC) 05/17/2019   Hyperlipidemia associated with type 2 diabetes mellitus (HCC) 05/17/2019   GERD with esophagitis 05/17/2019   H/O total shoulder replacement, right 11/01/2018   Paroxysmal tachycardia (HCC) 01/11/2017   Nonrheumatic aortic valve stenosis 01/11/2017   Aortic atherosclerosis (HCC) 11/23/2016   HNP (herniated nucleus pulposus), lumbar 10/06/2016   Diarrhea 08/06/2016   Fever 08/06/2016   Hyperbilirubinemia 08/06/2016   Shock circulatory (HCC) 08/06/2016   Inguinal hernia 12/10/2015   Right groin pain 11/30/2015   Carpal tunnel syndrome on right 09/09/2015   Cervical spondylosis without myelopathy 09/09/2015   Thrombocytopenia (HCC) 07/21/2014   Bilateral carotid bruits 05/11/2014   Vitamin D deficiency 09/17/2013   BPH (benign prostatic hyperplasia) 05/22/2013   Low serum testosterone level 02/18/2013   Diabetes type 2, controlled (HCC) 01/10/2013   OSA (obstructive sleep apnea) 05/16/2012   Edema 02/21/2012   At risk for coronary artery disease 03/20/2011   Obesity 03/20/2011   Allergic rhinitis  06/22/2010   Asthma 06/22/2010   COUGH 06/22/2010   PCP:  [name], DO Pharmacy:   [facility] [place], [place] - [address] [place] [address] Phone: [phone] Fax: [phone]  [facility] Delivery - [place], [place] - [address] [place] [address] Phone: [phone] Fax: [phone]  [facility] - [place], [place] - [address] [place] [address] Phone: [phone] Fax: [phone]  [facility] [address] [place] [address] Phone: [phone] Fax: [phone]     Social Drivers of Health (SDOH) Social History: SDOH Screenings   Food Insecurity: Patient Unable To Answer (12/09/2023)  Housing: Patient Unable To Answer (12/09/2023)  Recent Concern: Housing - High Risk (10/30/2023)  Transportation Needs: Patient Unable To Answer (12/09/2023)  Utilities: Patient Unable To Answer (12/09/2023)  Depression (PHQ2-9): Medium Risk (09/13/2023)  Financial Resource Strain: Low Risk  (06/09/2022)  Social Connections: Patient Unable To Answer (12/09/2023)  Recent Concern: Social Connections - Socially Isolated (10/30/2023)  Tobacco Use: Medium Risk (12/08/2023)   SDOH Interventions:     Readmission Risk Interventions     No data to display

## 2023-12-11 NOTE — Progress Notes (Signed)
 Daily Progress Note  DOA: 12/08/2023 Hospital Day: 4   Chief Complaint: melena, coffee ground emesis  ASSESSMENT    73 y.o. year old male with multiple medical problems not limited to NASH cirrhosis with portal HTN, seizure disorder, AFIB on eliquis, chronic diastolic heart failure, sleep apnea, DM, gastric AVMs / GAVE, colon polyps. Admitted with 4/11 with encephalopathy and melena  NASH cirrhosis with portal HTN  Acute encephalopathy, suspected to be hepatic encephalopathy.  MELD 3.0: 12 at 12/11/2023  3:31 AM First time meeting him. Realizes he is at [facility] but not oriented to year and says random words. Head CT without acute findings. Was getting lactulose via NGT which he pulled out yesterday but tolerating liquids / PO meds  Melena / coffee ground emesis on admission.  Hx of GAVE Hx of portal HTN Bleeding possibly secondary to portal hypertension gastropathy, GAVE or AVMs . Eliquis ( on hold).  Today: Currently incontinent of brown stool - not actively bleeding. Hgb stable ( improved) to 11.1  Afib Eliquis reversed on admission  Recent endocarditis  Severe aortic stenosis  Possible proctitis on CT AP.   Stools loose but getting lactulose. Clinical significance of finding unclear.   PLAN   --Continue PO Lactulose QID. --Continue  Xifaxan BID --Continue BID IV PPI --EGD when encephalopathy resolved.   Subjective   No specific complaints.   Objective   GI Studies:   09/25/23 EGD ( done prior to TEE) - Esophageal plaques were found, consistent with candidiasis. - Gastric antral vascular ectasia without bleeding. - Portal hypertensive gastropathy. - Normal examined duodenum. - No specimens collected.  Feb 2024 polyp surveillance colonoscopy  - Two 3 to 5 mm polyps in the cecum, removed . One 3 mm polyp in the transverse colon, removed with a cold snare. One 4 mm polyp in the descending colon, removed with a cold snare.  Moderate diverticulosis in the sigmoid  colon, in the descending colon and in the ascending colon.  Internal hemorrhoids.  Feb 2024 EGD  - Normal esophagus. - No esophageal or gastric varices. - A single non-bleeding angioectasia in the stomach. - Portal hypertensive gastropathy. - Mild Gastric antral vascular ectasia without bleeding. - Normal examined duodenum. - Heme + stools could result from either or combination of (angioectasia, portal gastropathy or GAVE) - No specimens collected. Recent Labs    12/09/23 0648 12/10/23 0813 12/11/23 0331  WBC 5.7 6.4 8.9  HGB 10.6* 10.4* 11.1*  HCT 31.6* 31.5* 34.4*  MCV 104.3* 106.1* 109.2*  PLT 161 155 172   No results for input(s): "FOLATE", "VITAMINB12", "FERRITIN", "TIBC", "IRONPCTSAT" in the last 72 hours. Recent Labs    12/09/23 0648 12/10/23 0813 12/11/23 0331  NA 144 145 149*  K 3.1* 2.9* 3.9  CL 112* 114* 119*  CO2 22 22 22   GLUCOSE 117* 293* 327*  BUN 8 6* 7*  CREATININE 0.85 0.86 1.10  CALCIUM 9.3 9.1 9.7   Recent Labs    12/09/23 0648 12/10/23 0813 12/11/23 0331  PROT 5.6* 5.5* 5.7*  ALBUMIN 2.4* 2.2* 2.3*  AST 64* 46* 50*  ALT 45* 41 47*  ALKPHOS 90 80 92  BILITOT 1.1 0.9 1.1   Recent Labs    12/10/23 0813  INR 1.3*   No results for input(s): "AFPTUMOR" in the last 72 hours.   No results for input(s): "ANA" in the last 72 hours.  Imaging:  DG Chest Port 1 View CLINICAL DATA:  Shortness of breath  EXAM: PORTABLE CHEST 1 VIEW  COMPARISON:  12/08/2023  FINDINGS: Normal heart size and mediastinal contours. Elevated right diaphragm. There is no edema, consolidation, effusion, or pneumothorax. Bilateral glenohumeral arthroplasty and generator pack over the right chest. Extensive artifact from EKG leads.  IMPRESSION: Stable exam.  No evidence of acute disease.  Electronically Signed   By: Ronnette Coke M.D.   On: 12/11/2023 06:53     Scheduled inpatient medications:   insulin aspart  0-15 Units Subcutaneous Q4H   insulin  glargine-yfgn  18 Units Subcutaneous BID   lactulose  30 g Oral QID   pantoprazole (PROTONIX) IV  40 mg Intravenous Q12H   rifaximin  550 mg Oral BID   sodium chloride flush  3 mL Intravenous Q12H   Continuous inpatient infusions:   dextrose 50 mL/hr at 12/11/23 0615   PRN inpatient medications: acetaminophen **OR** acetaminophen, diltiazem  Vital signs in last 24 hours: Temp:  [97.8 F (36.6 C)-98.8 F (37.1 C)] 98.5 F (36.9 C) (04/14 1200) Pulse Rate:  [69-100] 82 (04/14 1200) Resp:  [14-20] 16 (04/14 1200) BP: (105-131)/(60-89) 116/68 (04/14 1200) SpO2:  [93 %-100 %] 97 % (04/14 1200) Last BM Date : 12/11/23  Intake/Output Summary (Last 24 hours) at 12/11/2023 1349 Last data filed at 12/11/2023 1209 Gross per 24 hour  Intake 2725 ml  Output 1050 ml  Net 1675 ml    Intake/Output from previous day: 04/13 0701 - 04/14 0700 In: 2245 [P.O.:720; I.V.:1525] Out: 1050 [Urine:1050] Intake/Output this shift: Total I/O In: 480 [P.O.:480] Out: -    Physical Exam:  General: Alert male in NAD wearing protection mittens Heart:  Regular rate.  Pulmonary: Normal respiratory effort Abdomen: Soft, nondistended, nontender. Hyperactive bowel sounds. Extremities: LUE slightly edematous. No lower extremity edema  Neurologic: Alert , oriented to place.  Psych: t. Cooperative     LOS: 3 days   Mai Schwalbe ,NP 12/11/2023, 1:49 PM

## 2023-12-11 NOTE — Telephone Encounter (Signed)
 Pharmacy Patient Advocate Encounter   Received notification from Inpatient Request that prior authorization for Xifaxan 550MG  tablets is required/requested.   Insurance verification completed.   The patient is insured through  [facility]  .   Per test claim: PA required; PA submitted to above mentioned insurance via CoverMyMeds Key/confirmation #/EOC [id number] Status is pending

## 2023-12-12 ENCOUNTER — Other Ambulatory Visit (HOSPITAL_COMMUNITY): Payer: Self-pay

## 2023-12-12 ENCOUNTER — Encounter: Payer: Self-pay | Admitting: Hematology

## 2023-12-12 DIAGNOSIS — K7469 Other cirrhosis of liver: Secondary | ICD-10-CM | POA: Diagnosis not present

## 2023-12-12 DIAGNOSIS — K7460 Unspecified cirrhosis of liver: Secondary | ICD-10-CM | POA: Diagnosis not present

## 2023-12-12 DIAGNOSIS — K3189 Other diseases of stomach and duodenum: Secondary | ICD-10-CM | POA: Diagnosis not present

## 2023-12-12 DIAGNOSIS — K766 Portal hypertension: Secondary | ICD-10-CM | POA: Diagnosis not present

## 2023-12-12 DIAGNOSIS — R4182 Altered mental status, unspecified: Secondary | ICD-10-CM

## 2023-12-12 DIAGNOSIS — K7581 Nonalcoholic steatohepatitis (NASH): Secondary | ICD-10-CM | POA: Diagnosis not present

## 2023-12-12 DIAGNOSIS — K921 Melena: Secondary | ICD-10-CM | POA: Diagnosis not present

## 2023-12-12 DIAGNOSIS — K922 Gastrointestinal hemorrhage, unspecified: Secondary | ICD-10-CM | POA: Diagnosis not present

## 2023-12-12 LAB — COMPREHENSIVE METABOLIC PANEL WITH GFR
ALT: 43 U/L (ref 0–44)
AST: 36 U/L (ref 15–41)
Albumin: 2.3 g/dL — ABNORMAL LOW (ref 3.5–5.0)
Alkaline Phosphatase: 94 U/L (ref 38–126)
Anion gap: 7 (ref 5–15)
BUN: 5 mg/dL — ABNORMAL LOW (ref 8–23)
CO2: 23 mmol/L (ref 22–32)
Calcium: 9.9 mg/dL (ref 8.9–10.3)
Chloride: 121 mmol/L — ABNORMAL HIGH (ref 98–111)
Creatinine, Ser: 0.97 mg/dL (ref 0.61–1.24)
GFR, Estimated: >60 mL/min (ref >60)
Glucose, Bld: 87 mg/dL (ref 70–99)
Potassium: 3.4 mmol/L — ABNORMAL LOW (ref 3.5–5.1)
Sodium: 151 mmol/L — ABNORMAL HIGH (ref 135–145)
Total Bilirubin: 1.1 mg/dL (ref 0.0–1.2)
Total Protein: 5.9 g/dL — ABNORMAL LOW (ref 6.5–8.1)

## 2023-12-12 LAB — CBC WITH DIFFERENTIAL/PLATELET
Abs Immature Granulocytes: 0.03 10*3/uL (ref 0.00–0.07)
Basophils Absolute: 0.1 10*3/uL (ref 0.0–0.1)
Basophils Relative: 1 %
Eosinophils Absolute: 0.2 10*3/uL (ref 0.0–0.5)
Eosinophils Relative: 2 %
HCT: 35.6 % — ABNORMAL LOW (ref 39.0–52.0)
Hemoglobin: 11.3 g/dL — ABNORMAL LOW (ref 13.0–17.0)
Immature Granulocytes: 0 %
Lymphocytes Relative: 19 %
Lymphs Abs: 1.4 10*3/uL (ref 0.7–4.0)
MCH: 34.9 pg — ABNORMAL HIGH (ref 26.0–34.0)
MCHC: 31.7 g/dL (ref 30.0–36.0)
MCV: 109.9 fL — ABNORMAL HIGH (ref 80.0–100.0)
Monocytes Absolute: 0.7 10*3/uL (ref 0.1–1.0)
Monocytes Relative: 10 %
Neutro Abs: 4.9 10*3/uL (ref 1.7–7.7)
Neutrophils Relative %: 68 %
Platelets: 154 10*3/uL (ref 150–400)
RBC: 3.24 MIL/uL — ABNORMAL LOW (ref 4.22–5.81)
RDW: 18 % — ABNORMAL HIGH (ref 11.5–15.5)
WBC: 7.2 10*3/uL (ref 4.0–10.5)
nRBC: 0 % (ref 0.0–0.2)

## 2023-12-12 LAB — GLUCOSE, CAPILLARY
Glucose-Capillary: 104 mg/dL — ABNORMAL HIGH (ref 70–99)
Glucose-Capillary: 183 mg/dL — ABNORMAL HIGH (ref 70–99)
Glucose-Capillary: 203 mg/dL — ABNORMAL HIGH (ref 70–99)
Glucose-Capillary: 175 mg/dL — ABNORMAL HIGH (ref 70–99)
Glucose-Capillary: 173 mg/dL — ABNORMAL HIGH (ref 70–99)

## 2023-12-12 LAB — MAGNESIUM: Magnesium: 1.9 mg/dL (ref 1.7–2.4)

## 2023-12-12 LAB — AMMONIA: Ammonia: 53 umol/L — ABNORMAL HIGH (ref 9–35)

## 2023-12-12 LAB — BRAIN NATRIURETIC PEPTIDE: B Natriuretic Peptide: 388.1 pg/mL — ABNORMAL HIGH (ref 0.0–100.0)

## 2023-12-12 MED ORDER — LACTULOSE 10 GM/15ML PO SOLN
30.0000 g | Freq: Three times a day (TID) | ORAL | Status: DC
  Administered 2023-12-12 – 2024-01-08 (×79): 30 g via ORAL
  Administered 2024-01-08: 20 g via ORAL
  Administered 2024-01-09 – 2024-01-11 (×8): 30 g via ORAL
  Filled 2023-12-12 (×88): qty 60

## 2023-12-12 MED ORDER — INSULIN GLARGINE-YFGN 100 UNIT/ML ~~LOC~~ SOLN
25.0000 [IU] | Freq: Two times a day (BID) | SUBCUTANEOUS | Status: DC
  Administered 2023-12-12 – 2023-12-13 (×3): 25 [IU] via SUBCUTANEOUS
  Filled 2023-12-12 (×6): qty 0.25

## 2023-12-12 MED ORDER — ATORVASTATIN CALCIUM 40 MG PO TABS
40.0000 mg | ORAL_TABLET | Freq: Every day | ORAL | Status: DC
  Administered 2023-12-12 – 2023-12-13 (×2): 40 mg via ORAL
  Filled 2023-12-12 (×2): qty 1

## 2023-12-12 MED ORDER — DEXTROSE 5 % IV SOLN: INTRAVENOUS | Status: AC

## 2023-12-12 MED ORDER — INSULIN ASPART 100 UNIT/ML IJ SOLN
0.0000 [IU] | INTRAMUSCULAR | Status: DC
  Administered 2023-12-12 (×2): 4 [IU] via SUBCUTANEOUS
  Administered 2023-12-12 – 2023-12-13 (×2): 7 [IU] via SUBCUTANEOUS
  Administered 2023-12-13: 11 [IU] via SUBCUTANEOUS
  Administered 2023-12-13 – 2023-12-14 (×4): 4 [IU] via SUBCUTANEOUS
  Administered 2023-12-14: 15 [IU] via SUBCUTANEOUS
  Administered 2023-12-14: 3 [IU] via SUBCUTANEOUS
  Administered 2023-12-15: 7 [IU] via SUBCUTANEOUS

## 2023-12-12 MED ORDER — AMIODARONE HCL 200 MG PO TABS
200.0000 mg | ORAL_TABLET | Freq: Every day | ORAL | Status: DC
  Administered 2023-12-12 – 2023-12-17 (×6): 200 mg via ORAL
  Filled 2023-12-12 (×6): qty 1

## 2023-12-12 NOTE — H&P (View-Only) (Signed)
 Daily Progress Note  DOA: 12/08/2023 Hospital Day: 5   Chief Complaint: melena, coffee ground emesis  ASSESSMENT    73 y.o. year old male with multiple medical problems not limited to NASH cirrhosis with portal HTN, seizure disorder, AFIB on eliquis, chronic diastolic heart failure, sleep apnea, DM, gastric AVMs / GAVE, colon polyps. Admitted with 4/11 with encephalopathy and melena  MASH cirrhosis with portal HTN  Acute encephalopathy, suspected to be hepatic encephalopathy.  Encephalopathy appears to be improving.  He is alert and oriented x 3 today.  I do wonder if he may have some baseline dementia, though the ex-wife was not able to corroborate this.  Melena / coffee ground emesis on admission.  Hx of GAVE Hx of portal HTN Bleeding possibly secondary to portal hypertension gastropathy, GAVE or AVMs . Eliquis ( on hold).  I went over the risk and benefits of the EGD procedure with the patient and the patient's ex-wife [name] [facility]).  [name] also spoke with Dr. Thedore Mins, and she is agreeable to EGD for tomorrow for further evaluation to the patient's melena/coffee-ground emesis.   Afib Eliquis reversed on admission  Recent endocarditis  Severe aortic stenosis  Possible proctitis on CT AP.   Stools loose but getting lactulose. Clinical significance of finding unclear.   PLAN   --Continue PO Lactulose QID. --Continue  Xifaxan BID --Continue BID IV PPI --EGD tomorrow. Hopefully if looks okay then can be restart on Eliquis soon  Subjective   A&Ox3 today. Still has restrains and does say some strange things. Passed 6 stools yesterday  Objective   GI Studies:   09/25/23 EGD ( done prior to TEE) - Esophageal plaques were found, consistent with candidiasis. - Gastric antral vascular ectasia without bleeding. - Portal hypertensive gastropathy. - Normal examined duodenum. - No specimens collected.  Feb 2024 polyp surveillance colonoscopy  - Two 3 to 5 mm polyps in the  cecum, removed . One 3 mm polyp in the transverse colon, removed with a cold snare. One 4 mm polyp in the descending colon, removed with a cold snare.  Moderate diverticulosis in the sigmoid colon, in the descending colon and in the ascending colon.  Internal hemorrhoids.  Feb 2024 EGD  - Normal esophagus. - No esophageal or gastric varices. - A single non-bleeding angioectasia in the stomach. - Portal hypertensive gastropathy. - Mild Gastric antral vascular ectasia without bleeding. - Normal examined duodenum. - Heme + stools could result from either or combination of (angioectasia, portal gastropathy or GAVE) - No specimens collected. Recent Labs    12/10/23 0813 12/11/23 0331 12/12/23 0827  WBC 6.4 8.9 7.2  HGB 10.4* 11.1* 11.3*  HCT 31.5* 34.4* 35.6*  MCV 106.1* 109.2* 109.9*  PLT 155 172 154   No results for input(s): "FOLATE", "VITAMINB12", "FERRITIN", "TIBC", "IRONPCTSAT" in the last 72 hours. Recent Labs    12/10/23 0813 12/11/23 0331 12/12/23 0343  NA 145 149* 151*  K 2.9* 3.9 3.4*  CL 114* 119* 121*  CO2 22 22 23   GLUCOSE 293* 327* 87  BUN 6* 7* 5*  CREATININE 0.86 1.10 0.97  CALCIUM 9.1 9.7 9.9   Recent Labs    12/10/23 0813 12/11/23 0331 12/12/23 0343  PROT 5.5* 5.7* 5.9*  ALBUMIN 2.2* 2.3* 2.3*  AST 46* 50* 36  ALT 41 47* 43  ALKPHOS 80 92 94  BILITOT 0.9 1.1 1.1   Recent Labs    12/10/23 0813  INR 1.3*   No results for  input(s): "AFPTUMOR" in the last 72 hours.   No results for input(s): "ANA" in the last 72 hours.  Imaging:  DG Chest Port 1 View CLINICAL DATA:  Shortness of breath  EXAM: PORTABLE CHEST 1 VIEW  COMPARISON:  12/08/2023  FINDINGS: Normal heart size and mediastinal contours. Elevated right diaphragm. There is no edema, consolidation, effusion, or pneumothorax. Bilateral glenohumeral arthroplasty and generator pack over the right chest. Extensive artifact from EKG leads.  IMPRESSION: Stable exam.  No evidence of acute  disease.  Electronically Signed   By: Ronnette Coke M.D.   On: 12/11/2023 06:53     Scheduled inpatient medications:   amiodarone  200 mg Oral Daily   atorvastatin  40 mg Oral q1800   insulin aspart  0-20 Units Subcutaneous Q4H   insulin glargine-yfgn  25 Units Subcutaneous BID   lactulose  30 g Oral QID   pantoprazole (PROTONIX) IV  40 mg Intravenous Q12H   rifaximin  550 mg Oral BID   sodium chloride flush  3 mL Intravenous Q12H   Continuous inpatient infusions:   dextrose 100 mL/hr at 12/12/23 1449   PRN inpatient medications: acetaminophen **OR** acetaminophen, diltiazem  Vital signs in last 24 hours: Temp:  [97.7 F (36.5 C)-98.5 F (36.9 C)] 97.7 F (36.5 C) (04/15 0800) Pulse Rate:  [71-78] 71 (04/15 0800) Resp:  [17-24] 24 (04/15 0800) BP: (93-127)/(61-96) 109/96 (04/15 0800) SpO2:  [95 %-100 %] 99 % (04/15 0800) Last BM Date : 12/11/23  Intake/Output Summary (Last 24 hours) at 12/12/2023 1606 Last data filed at 12/12/2023 1251 Gross per 24 hour  Intake 1200 ml  Output 100 ml  Net 1100 ml    Intake/Output from previous day: 04/14 0701 - 04/15 0700 In: 1820 [P.O.:1820] Out: 900 [Urine:800; Stool:100] Intake/Output this shift: Total I/O In: 480 [P.O.:480] Out: -    Physical Exam:  General: Alert male in NAD wearing protection mittens Heart:  Regular rate.  Pulmonary: Normal respiratory effort Abdomen: Soft, nondistended, nontender. Hyperactive bowel sounds. Extremities: LUE slightly edematous. No lower extremity edema  Neurologic: Alert , oriented to place.  Psych: Cooperative     LOS: 4 days   Daina Drum, MD 12/12/2023, 4:06 PM

## 2023-12-12 NOTE — Care Management Important Message (Signed)
 Important Message  Patient Details  Name: Tony Cantu MRN: [id number] Date of Birth: August 17, 1951   Important Message Given:  Yes - Medicare IM     Wynonia Hedges 12/12/2023, 3:37 PM

## 2023-12-12 NOTE — Progress Notes (Addendum)
 PROGRESS NOTE                                                                                                                                                                                                             Patient Demographics:    Tony Cantu, is a 73 y.o. male, DOB - 22-Nov-1950, ION:[id number]  Outpatient Primary MD for the patient is Tony Ip, DO    LOS - 4  Admit date - 12/08/2023    Chief Complaint  Patient presents with   Fatigue       Brief Narrative (HPI from H&P)    73 y.o. male with medical history significant of hypertension, hyperlipidemia, diabetes, paroxysmal A-fib, paroxysmal tachycardia, GERD, COPD/asthma, chronic diastolic CHF, NAFLD, cirrhosis, portal hypertension, obesity, OSA, BPH, presenting with altered mental status and nausea vomiting and dark stool.   History team with assistance of chart review and EMS report.  Patient reportedly at facility after fall.  Patient has been acting differently for the past 1 to 2 days.  He has had increased fatigue and somnolence.  Reportedly has had nausea and vomiting with black emesis and now black stools.  He will nod to answer questions but has not been speaking.  Further workup was consistent with severe hepatic encephalopathy and possible upper GI bleed, GI was consulted and he was admitted.   Subjective:   Patient in bed, appears comfortable, denies any headache, no fever, no chest pain or pressure, no shortness of breath , no abdominal pain. No new focal weakness.   Assessment  & Plan :   UGI bleed in a patient with  [name], cirrhosis, portal hypertension and gastric varices >  History of cirrhosis with portal hypertension/varices, with melena as the presenting symptom. > GI consulted and have seen the patient and recommend IV PPI, IV octreotide (stopped), Eliquis reversed in the ER, has NG tube n.p.o., continue to monitor CBC appears to be  staying stable, please stable, GI contemplating EGD, will defer to GI as to when we can resume Eliquis.   Severe hepatic encephalopathy with underlying history of [place]  > improving with high-dose lactulose, Xifaxan, Monia level is trending down, mentation continues to improve, no focal deficits, head CT stable.   Hypertension  - Not on any antihypertensives   Hyperlipidemia  - Resume atorvastatin when tolerating p.o.  Paroxysmal atrial fibrillation  - Resume amiodarone, with underlying history of [name] will defer it to his primary cardiologist on long-term use of amiodarone. Eliquis held/reversed as above.  As needed IV Cardizem   GERD  - On IV PPI   COPD/asthma  - Resume Singulair and tolerating p.o.  Not on inhalers, no acute issues   Chronic diastolic CHF  > Last echocardiogram, Was TEE in January 2025 which showed EF 60-65%, normal RV function.  Not on diuretic,  Continue to monitor   QTc prolongation > With underlying right bundle branch block, hard to discern correct QTc.  Place electrolytes and monitor on telemetry.   Hypernatremia.  Failed to keep up with oral hydration, gentle D5W on 12/12/2023, insulin adjusted as he is expected to run high sugars.  Monitor.  Severe hypokalemia.  Replaced.    Diabetes  -D5W while n.p.o. and hypernatremic, placed on Lantus twice daily and SSI modified for better control  Lab Results  Component Value Date   HGBA1C 4.4 (L) 09/19/2023   CBG (last 3)  Recent Labs    12/11/23 2304 12/12/23 0357 12/12/23 0800  GLUCAP 159* 104* 183*         Condition - Extremely Guarded  Family Communication  : Ex-wife and primary decision maker is Tony Cantu [phone] in detail on 12/09/2023, 12/10/2023  Code Status : DNR  Consults  :  GI  PUD Prophylaxis : PPI   Procedures  :     CT head nonacute.   CT   abdomen pelvis.   1. Mild circumferential thickening of the rectum with mild to moderate perirectal fat stranding and trace presacral fluid,  favoring proctitis. 2. Otherwise, no acute inflammatory process identified within the abdomen or pelvis. 3. Cirrhotic liver with multiple venous collaterals, suggesting sequela of portal hypertension. No splenomegaly or ascites. 4. Multiple other nonacute observations, as described above. Aortic Atherosclerosis (ICD10-I70.0).      Disposition Plan  :    Status is: Inpatient  DVT Prophylaxis  :    Place and maintain sequential compression device Start: 12/09/23 0555 SCDs Start: 12/08/23 1707    Lab Results  Component Value Date   PLT 172 12/11/2023    Diet :  Diet Order             Diet clear liquid Room service appropriate? Yes; Fluid consistency: Nectar Thick  Diet effective now                    Inpatient Medications  Scheduled Meds:  amiodarone  200 mg Oral Daily   atorvastatin  40 mg Oral q1800   insulin aspart  0-20 Units Subcutaneous Q4H   insulin glargine-yfgn  25 Units Subcutaneous BID   lactulose  30 g Oral QID   pantoprazole (PROTONIX) IV  40 mg Intravenous Q12H   rifaximin  550 mg Oral BID   sodium chloride flush  3 mL Intravenous Q12H   Continuous Infusions:  dextrose 100 mL/hr at 12/12/23 0633   PRN Meds:.acetaminophen **OR** acetaminophen, diltiazem  Antibiotics  :    Anti-infectives (From admission, onward)    Start     Dose/Rate Route Frequency Ordered Stop   12/10/23 2200  rifaximin (XIFAXAN) tablet 550 mg        550 mg Oral 2 times daily 12/10/23 1815     12/09/23 1000  rifaximin (XIFAXAN) tablet 550 mg  Status:  Discontinued        550 mg Per Tube 2 times daily  12/09/23 0827 12/10/23 1815         Objective:   Vitals:   12/11/23 2045 12/11/23 2311 12/12/23 0358 12/12/23 0800  BP: 127/73 120/79 93/61 (!) 109/96  Pulse: 78 72 73 71  Resp: 17 18 18  (!) 24  Temp: 98.5 F (36.9 C) 98.5 F (36.9 C) 98.4 F (36.9 C) 97.7 F (36.5 C)  TempSrc: Oral Oral Oral   SpO2: 100% 98% 95% 99%  Weight:      Height:        Wt Readings  from Last 3 Encounters:  12/09/23 75.1 kg  11/14/23 98.4 kg  11/03/23 85.2 kg     Intake/Output Summary (Last 24 hours) at 12/12/2023 0938 Last data filed at 12/12/2023 0400 Gross per 24 hour  Intake 1820 ml  Output 900 ml  Net 920 ml     Physical Exam  Awake, no focal deficits, more alert now, no focal deficits,   [place].AT,PERRAL Supple Neck, No JVD,   Symmetrical Chest Klippel movement, Good air movement bilaterally, CTAB RRR,No Gallops,Rubs or new Murmurs,  +ve B.Sounds, Abd Soft, No tenderness,   No Cyanosis, Clubbing or edema      Data Review:    Recent Labs  Lab 12/08/23 1332 12/08/23 1406 12/08/23 1843 12/09/23 0038 12/09/23 0648 12/10/23 0813 12/11/23 0331  WBC 5.8  --  5.3 4.7 5.7 6.4 8.9  HGB 10.6*   < > 10.8* 10.5* 10.6* 10.4* 11.1*  HCT 32.4*   < > 32.0* 31.1* 31.6* 31.5* 34.4*  PLT 161  --  145* 145* 161 155 172  MCV 106.2*  --  104.6* 103.7* 104.3* 106.1* 109.2*  MCH 34.8*  --  35.3* 35.0* 35.0* 35.0* 35.2*  MCHC 32.7  --  33.8 33.8 33.5 33.0 32.3  RDW 17.3*  --  17.2* 17.3* 17.3* 17.7* 17.9*  LYMPHSABS 1.1  --   --   --   --  1.2 1.3  MONOABS 0.7  --   --   --   --  0.7 1.1*  EOSABS 0.0  --   --   --   --  0.1 0.0  BASOSABS 0.0  --   --   --   --  0.1 0.0   < > = values in this interval not displayed.    Recent Labs  Lab 12/08/23 1332 12/08/23 1406 12/08/23 1705 12/08/23 1843 12/09/23 0648 12/10/23 0813 12/11/23 0331 12/12/23 0343  NA 142 142  --   --  144 145 149* 151*  K 3.4* 3.3*  --   --  3.1* 2.9* 3.9 3.4*  CL 110  --   --   --  112* 114* 119* 121*  CO2 22  --   --   --  22 22 22 23   ANIONGAP 10  --   --   --  10 9 8 7   GLUCOSE 182*  --   --   --  117* 293* 327* 87  BUN 11  --   --   --  8 6* 7* 5*  CREATININE 0.85  --   --   --  0.85 0.86 1.10 0.97  AST 75*  --   --   --  64* 46* 50* 36  ALT 47*  --   --   --  45* 41 47* 43  ALKPHOS 90  --   --   --  90 80 92 94  BILITOT 0.9  --   --   --  1.1 0.9 1.1 1.1  ALBUMIN 2.4*  --   --    --  2.4* 2.2* 2.3* 2.3*  LATICACIDVEN  --  1.6  --   --   --   --   --   --   INR  --   --  1.9*  --   --  1.3*  --   --   TSH  --   --   --  1.469  --   --   --   --   AMMONIA 111*  --   --   --  154* 61* 55* 53*  BNP  --   --   --   --  420.3* 423.0* 351.3* 388.1*  MG  --   --   --   --  1.8 1.8 2.0 1.9  CALCIUM 9.1  --   --   --  9.3 9.1 9.7 9.9      Recent Labs  Lab 12/08/23 1332 12/08/23 1406 12/08/23 1705 12/08/23 1843 12/09/23 0648 12/10/23 0813 12/11/23 0331 12/12/23 0343  LATICACIDVEN  --  1.6  --   --   --   --   --   --   INR  --   --  1.9*  --   --  1.3*  --   --   TSH  --   --   --  1.469  --   --   --   --   AMMONIA 111*  --   --   --  154* 61* 55* 53*  BNP  --   --   --   --  420.3* 423.0* 351.3* 388.1*  MG  --   --   --   --  1.8 1.8 2.0 1.9  CALCIUM 9.1  --   --   --  9.3 9.1 9.7 9.9    --------------------------------------------------------------------------------------------------------------- Lab Results  Component Value Date   CHOL 126 01/04/2022   HDL 57 01/04/2022   LDLCALC 55 01/04/2022   TRIG 67 01/04/2022   CHOLHDL 2.2 01/04/2022    Lab Results  Component Value Date   HGBA1C 4.4 (L) 09/19/2023   No results for input(s): "TSH", "T4TOTAL", "FREET4", "T3FREE", "THYROIDAB" in the last 72 hours.    Radiology Report DG Chest Port 1 View Result Date: 12/11/2023 CLINICAL DATA:  Shortness of breath EXAM: PORTABLE CHEST 1 VIEW COMPARISON:  12/08/2023 FINDINGS: Normal heart size and mediastinal contours. Elevated right diaphragm. There is no edema, consolidation, effusion, or pneumothorax. Bilateral glenohumeral arthroplasty and generator pack over the right chest. Extensive artifact from EKG leads. IMPRESSION: Stable exam.  No evidence of acute disease. Electronically Signed   By: Ronnette Coke M.D.   On: 12/11/2023 06:53     Signature  -   Lynnwood Sauer M.D on 12/12/2023 at 9:38 AM   -  To page go to [email or web address]

## 2023-12-12 NOTE — Telephone Encounter (Signed)
 Pharmacy Patient Advocate Encounter  Received notification from [facility] that Prior Authorization for Xifaxan 550MG  tablets  has been APPROVED from 12/11/2023 to 12/10/2024. Ran test claim, Copay is $100.00. This test claim was processed through [facility]- copay amounts may vary at other pharmacies due to pharmacy/plan contracts, or as the patient moves through the different stages of their insurance plan.   PA #/Case ID/Reference #: [id number]

## 2023-12-12 NOTE — Progress Notes (Signed)
 Daily Progress Note  DOA: 12/08/2023 Hospital Day: 5   Chief Complaint: melena, coffee ground emesis  ASSESSMENT    73 y.o. year old male with multiple medical problems not limited to NASH cirrhosis with portal HTN, seizure disorder, AFIB on eliquis, chronic diastolic heart failure, sleep apnea, DM, gastric AVMs / GAVE, colon polyps. Admitted with 4/11 with encephalopathy and melena  MASH cirrhosis with portal HTN  Acute encephalopathy, suspected to be hepatic encephalopathy.  Encephalopathy appears to be improving.  He is alert and oriented x 3 today.  I do wonder if he may have some baseline dementia, though the ex-wife was not able to corroborate this.  Melena / coffee ground emesis on admission.  Hx of GAVE Hx of portal HTN Bleeding possibly secondary to portal hypertension gastropathy, GAVE or AVMs . Eliquis ( on hold).  I went over the risk and benefits of the EGD procedure with the patient and the patient's ex-wife [name] [facility]).  [name] also spoke with Dr. Thedore Mins, and she is agreeable to EGD for tomorrow for further evaluation to the patient's melena/coffee-ground emesis.   Afib Eliquis reversed on admission  Recent endocarditis  Severe aortic stenosis  Possible proctitis on CT AP.   Stools loose but getting lactulose. Clinical significance of finding unclear.   PLAN   --Continue PO Lactulose QID. --Continue  Xifaxan BID --Continue BID IV PPI --EGD tomorrow. Hopefully if looks okay then can be restart on Eliquis soon  Subjective   A&Ox3 today. Still has restrains and does say some strange things. Passed 6 stools yesterday  Objective   GI Studies:   09/25/23 EGD ( done prior to TEE) - Esophageal plaques were found, consistent with candidiasis. - Gastric antral vascular ectasia without bleeding. - Portal hypertensive gastropathy. - Normal examined duodenum. - No specimens collected.  Feb 2024 polyp surveillance colonoscopy  - Two 3 to 5 mm polyps in the  cecum, removed . One 3 mm polyp in the transverse colon, removed with a cold snare. One 4 mm polyp in the descending colon, removed with a cold snare.  Moderate diverticulosis in the sigmoid colon, in the descending colon and in the ascending colon.  Internal hemorrhoids.  Feb 2024 EGD  - Normal esophagus. - No esophageal or gastric varices. - A single non-bleeding angioectasia in the stomach. - Portal hypertensive gastropathy. - Mild Gastric antral vascular ectasia without bleeding. - Normal examined duodenum. - Heme + stools could result from either or combination of (angioectasia, portal gastropathy or GAVE) - No specimens collected. Recent Labs    12/10/23 0813 12/11/23 0331 12/12/23 0827  WBC 6.4 8.9 7.2  HGB 10.4* 11.1* 11.3*  HCT 31.5* 34.4* 35.6*  MCV 106.1* 109.2* 109.9*  PLT 155 172 154   No results for input(s): "FOLATE", "VITAMINB12", "FERRITIN", "TIBC", "IRONPCTSAT" in the last 72 hours. Recent Labs    12/10/23 0813 12/11/23 0331 12/12/23 0343  NA 145 149* 151*  K 2.9* 3.9 3.4*  CL 114* 119* 121*  CO2 22 22 23   GLUCOSE 293* 327* 87  BUN 6* 7* 5*  CREATININE 0.86 1.10 0.97  CALCIUM 9.1 9.7 9.9   Recent Labs    12/10/23 0813 12/11/23 0331 12/12/23 0343  PROT 5.5* 5.7* 5.9*  ALBUMIN 2.2* 2.3* 2.3*  AST 46* 50* 36  ALT 41 47* 43  ALKPHOS 80 92 94  BILITOT 0.9 1.1 1.1   Recent Labs    12/10/23 0813  INR 1.3*   No results for  input(s): "AFPTUMOR" in the last 72 hours.   No results for input(s): "ANA" in the last 72 hours.  Imaging:  DG Chest Port 1 View CLINICAL DATA:  Shortness of breath  EXAM: PORTABLE CHEST 1 VIEW  COMPARISON:  12/08/2023  FINDINGS: Normal heart size and mediastinal contours. Elevated right diaphragm. There is no edema, consolidation, effusion, or pneumothorax. Bilateral glenohumeral arthroplasty and generator pack over the right chest. Extensive artifact from EKG leads.  IMPRESSION: Stable exam.  No evidence of acute  disease.  Electronically Signed   By: Ronnette Coke M.D.   On: 12/11/2023 06:53     Scheduled inpatient medications:   amiodarone  200 mg Oral Daily   atorvastatin  40 mg Oral q1800   insulin aspart  0-20 Units Subcutaneous Q4H   insulin glargine-yfgn  25 Units Subcutaneous BID   lactulose  30 g Oral QID   pantoprazole (PROTONIX) IV  40 mg Intravenous Q12H   rifaximin  550 mg Oral BID   sodium chloride flush  3 mL Intravenous Q12H   Continuous inpatient infusions:   dextrose 100 mL/hr at 12/12/23 1449   PRN inpatient medications: acetaminophen **OR** acetaminophen, diltiazem  Vital signs in last 24 hours: Temp:  [97.7 F (36.5 C)-98.5 F (36.9 C)] 97.7 F (36.5 C) (04/15 0800) Pulse Rate:  [71-78] 71 (04/15 0800) Resp:  [17-24] 24 (04/15 0800) BP: (93-127)/(61-96) 109/96 (04/15 0800) SpO2:  [95 %-100 %] 99 % (04/15 0800) Last BM Date : 12/11/23  Intake/Output Summary (Last 24 hours) at 12/12/2023 1606 Last data filed at 12/12/2023 1251 Gross per 24 hour  Intake 1200 ml  Output 100 ml  Net 1100 ml    Intake/Output from previous day: 04/14 0701 - 04/15 0700 In: 1820 [P.O.:1820] Out: 900 [Urine:800; Stool:100] Intake/Output this shift: Total I/O In: 480 [P.O.:480] Out: -    Physical Exam:  General: Alert male in NAD wearing protection mittens Heart:  Regular rate.  Pulmonary: Normal respiratory effort Abdomen: Soft, nondistended, nontender. Hyperactive bowel sounds. Extremities: LUE slightly edematous. No lower extremity edema  Neurologic: Alert , oriented to place.  Psych: Cooperative     LOS: 4 days   Daina Drum, MD 12/12/2023, 4:06 PM

## 2023-12-12 NOTE — Progress Notes (Signed)
 PT Cancellation Note  Patient Details Name: Tony Cantu MRN: [id number] DOB: 06-01-1951   Cancelled Treatment:    Reason Eval/Treat Not Completed: Other (comment)  Pt with continuous stool incontinence. RN reports they have been attempting to get rectal tube today without luck. Cleaning pt again now. Will hold formal PT visit and follow-up tomorrow, hopefully BMs better or rectal tube placed to allow productive visit.  Jory Ng, PT, DPT [facility]  [facility] Services Physical Therapist Office: [phone] Website: [email or web address]   Alinda Irani 12/12/2023, 4:47 PM

## 2023-12-13 ENCOUNTER — Encounter (HOSPITAL_COMMUNITY)
Admission: EM | Disposition: A | Discharge status: Skilled Nursing Facility | Payer: Self-pay | Source: Skilled Nursing Facility | Attending: Internal Medicine

## 2023-12-13 ENCOUNTER — Telehealth: Payer: Self-pay | Admitting: *Deleted

## 2023-12-13 ENCOUNTER — Inpatient Hospital Stay (HOSPITAL_COMMUNITY): Admitting: Certified Registered Nurse Anesthetist

## 2023-12-13 ENCOUNTER — Encounter (HOSPITAL_COMMUNITY): Payer: Self-pay | Admitting: Internal Medicine

## 2023-12-13 DIAGNOSIS — K31819 Angiodysplasia of stomach and duodenum without bleeding: Secondary | ICD-10-CM | POA: Diagnosis not present

## 2023-12-13 DIAGNOSIS — K3189 Other diseases of stomach and duodenum: Secondary | ICD-10-CM | POA: Diagnosis not present

## 2023-12-13 DIAGNOSIS — K766 Portal hypertension: Secondary | ICD-10-CM | POA: Diagnosis not present

## 2023-12-13 DIAGNOSIS — K922 Gastrointestinal hemorrhage, unspecified: Secondary | ICD-10-CM | POA: Diagnosis not present

## 2023-12-13 DIAGNOSIS — K31811 Angiodysplasia of stomach and duodenum with bleeding: Secondary | ICD-10-CM | POA: Diagnosis not present

## 2023-12-13 HISTORY — PX: HOT HEMOSTASIS: SHX5433

## 2023-12-13 HISTORY — PX: ESOPHAGOGASTRODUODENOSCOPY: SHX5428

## 2023-12-13 LAB — BRAIN NATRIURETIC PEPTIDE: B Natriuretic Peptide: 334.3 pg/mL — ABNORMAL HIGH (ref 0.0–100.0)

## 2023-12-13 LAB — COMPREHENSIVE METABOLIC PANEL WITH GFR
ALT: 38 U/L (ref 0–44)
AST: 40 U/L (ref 15–41)
Albumin: 2 g/dL — ABNORMAL LOW (ref 3.5–5.0)
Alkaline Phosphatase: 84 U/L (ref 38–126)
Anion gap: 4 — ABNORMAL LOW (ref 5–15)
BUN: <5 mg/dL — ABNORMAL LOW (ref 8–23)
CO2: 23 mmol/L (ref 22–32)
Calcium: 9.1 mg/dL (ref 8.9–10.3)
Chloride: 113 mmol/L — ABNORMAL HIGH (ref 98–111)
Creatinine, Ser: 0.82 mg/dL (ref 0.61–1.24)
GFR, Estimated: >60 mL/min (ref >60)
Glucose, Bld: 102 mg/dL — ABNORMAL HIGH (ref 70–99)
Potassium: 3.3 mmol/L — ABNORMAL LOW (ref 3.5–5.1)
Sodium: 140 mmol/L (ref 135–145)
Total Bilirubin: 1.1 mg/dL (ref 0.0–1.2)
Total Protein: 5.3 g/dL — ABNORMAL LOW (ref 6.5–8.1)

## 2023-12-13 LAB — CBC WITH DIFFERENTIAL/PLATELET
Abs Immature Granulocytes: 0.03 10*3/uL (ref 0.00–0.07)
Basophils Absolute: 0 10*3/uL (ref 0.0–0.1)
Basophils Relative: 0 %
Eosinophils Absolute: 0.2 10*3/uL (ref 0.0–0.5)
Eosinophils Relative: 3 %
HCT: 32.8 % — ABNORMAL LOW (ref 39.0–52.0)
Hemoglobin: 10.6 g/dL — ABNORMAL LOW (ref 13.0–17.0)
Immature Granulocytes: 0 %
Lymphocytes Relative: 23 %
Lymphs Abs: 1.7 10*3/uL (ref 0.7–4.0)
MCH: 35.5 pg — ABNORMAL HIGH (ref 26.0–34.0)
MCHC: 32.3 g/dL (ref 30.0–36.0)
MCV: 109.7 fL — ABNORMAL HIGH (ref 80.0–100.0)
Monocytes Absolute: 0.7 10*3/uL (ref 0.1–1.0)
Monocytes Relative: 10 %
Neutro Abs: 4.5 10*3/uL (ref 1.7–7.7)
Neutrophils Relative %: 64 %
Platelets: 123 10*3/uL — ABNORMAL LOW (ref 150–400)
RBC: 2.99 MIL/uL — ABNORMAL LOW (ref 4.22–5.81)
RDW: 17.4 % — ABNORMAL HIGH (ref 11.5–15.5)
WBC: 7.2 10*3/uL (ref 4.0–10.5)
nRBC: 0.4 % — ABNORMAL HIGH (ref 0.0–0.2)

## 2023-12-13 LAB — GLUCOSE, CAPILLARY
Glucose-Capillary: 108 mg/dL — ABNORMAL HIGH (ref 70–99)
Glucose-Capillary: 165 mg/dL — ABNORMAL HIGH (ref 70–99)
Glucose-Capillary: 239 mg/dL — ABNORMAL HIGH (ref 70–99)
Glucose-Capillary: 264 mg/dL — ABNORMAL HIGH (ref 70–99)
Glucose-Capillary: 268 mg/dL — ABNORMAL HIGH (ref 70–99)
Glucose-Capillary: 76 mg/dL (ref 70–99)
Glucose-Capillary: 94 mg/dL (ref 70–99)

## 2023-12-13 LAB — MAGNESIUM: Magnesium: 1.5 mg/dL — ABNORMAL LOW (ref 1.7–2.4)

## 2023-12-13 LAB — AMMONIA: Ammonia: 52 umol/L — ABNORMAL HIGH (ref 9–35)

## 2023-12-13 SURGERY — EGD (ESOPHAGOGASTRODUODENOSCOPY)
Anesthesia: General

## 2023-12-13 MED ORDER — EPHEDRINE SULFATE-NACL 50-0.9 MG/10ML-% IV SOSY
PREFILLED_SYRINGE | INTRAVENOUS | Status: DC | PRN
  Administered 2023-12-13: 5 mg via INTRAVENOUS
  Administered 2023-12-13: 10 mg via INTRAVENOUS

## 2023-12-13 MED ORDER — PROPOFOL 10 MG/ML IV BOLUS
INTRAVENOUS | Status: DC | PRN
  Administered 2023-12-13: 100 mg via INTRAVENOUS

## 2023-12-13 MED ORDER — SODIUM CHLORIDE 0.9 % IV SOLN: INTRAVENOUS | Status: DC

## 2023-12-13 MED ORDER — SUCCINYLCHOLINE CHLORIDE 200 MG/10ML IV SOSY
PREFILLED_SYRINGE | INTRAVENOUS | Status: DC | PRN
  Administered 2023-12-13: 100 mg via INTRAVENOUS

## 2023-12-13 MED ORDER — PROPOFOL 500 MG/50ML IV EMUL
INTRAVENOUS | Status: DC | PRN
  Administered 2023-12-13: 80 ug/kg/min via INTRAVENOUS

## 2023-12-13 MED ORDER — SODIUM CHLORIDE 0.9 % IV SOLN: INTRAVENOUS | Status: DC | PRN

## 2023-12-13 MED ORDER — MAGNESIUM SULFATE 2 GM/50ML IV SOLN
2.0000 g | Freq: Once | INTRAVENOUS | Status: AC
  Administered 2023-12-13: 2 g via INTRAVENOUS
  Filled 2023-12-13: qty 50

## 2023-12-13 MED ORDER — GERHARDT'S BUTT CREAM
TOPICAL_CREAM | Freq: Three times a day (TID) | CUTANEOUS | Status: DC
  Administered 2023-12-17 – 2023-12-28 (×4): 1 via TOPICAL
  Filled 2023-12-13 (×2): qty 60

## 2023-12-13 MED ORDER — LIDOCAINE 2% (20 MG/ML) 5 ML SYRINGE
INTRAMUSCULAR | Status: DC | PRN
  Administered 2023-12-13: 40 mg via INTRAVENOUS

## 2023-12-13 MED ORDER — POTASSIUM CHLORIDE 10 MEQ/100ML IV SOLN
10.0000 meq | INTRAVENOUS | Status: AC
  Administered 2023-12-13 (×3): 10 meq via INTRAVENOUS
  Filled 2023-12-13 (×3): qty 100

## 2023-12-13 MED ORDER — POTASSIUM CHLORIDE CRYS ER 20 MEQ PO TBCR
40.0000 meq | EXTENDED_RELEASE_TABLET | Freq: Once | ORAL | Status: AC
  Administered 2023-12-13: 40 meq via ORAL
  Filled 2023-12-13: qty 2

## 2023-12-13 MED ORDER — PHENYLEPHRINE 80 MCG/ML (10ML) SYRINGE FOR IV PUSH (FOR BLOOD PRESSURE SUPPORT)
PREFILLED_SYRINGE | INTRAVENOUS | Status: DC | PRN
Start: 2023-12-13 — End: 2023-12-13
  Administered 2023-12-13: 240 ug via INTRAVENOUS
  Administered 2023-12-13 (×2): 160 ug via INTRAVENOUS

## 2023-12-13 NOTE — Anesthesia Preprocedure Evaluation (Signed)
 Anesthesia Evaluation  Patient identified by MRN, date of birth, ID band Patient awake    Reviewed: Allergy & Precautions, NPO status , Patient's Chart, lab work & pertinent test results  History of Anesthesia Complications Negative for: history of anesthetic complications  Airway Mallampati: IV  TM Distance: <3 FB Neck ROM: Full  Mouth opening: Limited Mouth Opening  Dental  (+) Poor Dentition, Chipped, Dental Advisory Given   Pulmonary asthma , sleep apnea and Continuous Positive Airway Pressure Ventilation , COPD, former smoker   breath sounds clear to auscultation       Cardiovascular hypertension, + dysrhythmias Atrial Fibrillation + Valvular Problems/Murmurs AS  Rhythm:Regular  Echocardiogram January 2025 showed normal LV function, mild to moderate left ventricular enlargement, grade 1 diastolic dysfunction, mild mitral stenosis, severe aortic stenosis with mean gradient 46.3 mmHg.   Neuro/Psych  Headaches, Seizures -, Well Controlled,   negative psych ROS   GI/Hepatic PUD,GERD  ,,(+) Cirrhosis         Endo/Other  diabetes, Type 2, Insulin Dependent    Renal/GU Renal diseaseLab Results      Component                Value               Date                      NA                       140                 12/13/2023                K                        3.3 (L)             12/13/2023                CO2                      23                  12/13/2023                GLUCOSE                  102 (H)             12/13/2023                BUN                      <5 (L)              12/13/2023                CREATININE               0.82                12/13/2023                CALCIUM                  9.1                 12/13/2023  GFR                      77.82               08/03/2022                EGFR                     92                  11/14/2023                GFRNONAA                 >60                  12/13/2023             negative genitourinary   Musculoskeletal  (+) Arthritis ,    Abdominal   Peds  Hematology  (+) Blood dyscrasia (eliquis), anemia Lab Results      Component                Value               Date                      WBC                      4.8                 09/24/2023                HGB                      9.6 (L)             09/24/2023                HCT                      28.0 (L)            09/24/2023                MCV                      104.1 (H)           09/24/2023                PLT                      93 (L)              09/24/2023              Anesthesia Other Findings 73 y.o. male with past medical history of paroxysmal atrial fibrillation, aortic stenosis, hypertension, hyperlipidemia, diabetes mellitus, cirrhosis with gastric varices, gastric ulcers, peptic ulcer disease, obstructive sleep apnea   Reproductive/Obstetrics                              Anesthesia Physical Anesthesia Plan  ASA: 3  Anesthesia Plan: General   Post-op Pain Management: Minimal or no pain anticipated   Induction: Intravenous and Rapid sequence  PONV Risk Score and Plan: 2 and Propofol  infusion, Treatment may vary due to age or medical condition and TIVA  Airway Management Planned: Oral ETT and Video Laryngoscope Planned  Additional Equipment: None  Intra-op Plan:   Post-operative Plan: Extubation in OR  Informed Consent: I have reviewed the patients History and Physical, chart, labs and discussed the procedure including the risks, benefits and alternatives for the proposed anesthesia with the patient or authorized representative who has indicated his/her understanding and acceptance.     Dental advisory given  Plan Discussed with: CRNA  Anesthesia Plan Comments:         Anesthesia Quick Evaluation

## 2023-12-13 NOTE — Interval H&P Note (Signed)
 History and Physical Interval Note:  12/13/2023 11:34 AM  Tony Cantu  has presented today for surgery, with the diagnosis of Coffee ground emesis, melena, anemia.  The various methods of treatment have been discussed with the patient and family. After consideration of risks, benefits and other options for treatment, the patient has consented to  Procedure(s): EGD (ESOPHAGOGASTRODUODENOSCOPY) (N/A) as a surgical intervention.  The patient's history has been reviewed, patient examined, no change in status, stable for surgery.  I have reviewed the patient's chart and labs.  Questions were answered to the patient's satisfaction.     Ger Ringenberg C Deeandra Jerry

## 2023-12-13 NOTE — Progress Notes (Signed)
 SLP Cancellation Note  Patient Details Name: Tony Cantu MRN: [id number] DOB: 1951/04/07   Cancelled treatment:       Reason Eval/Treat Not Completed: Patient at procedure or test/unavailable (EGD). SLP will f/u as time permits.   Amil Kale, M.A., CCC-SLP Speech Language Pathology, Acute Rehabilitation Services  Secure Chat preferred [phone]  12/13/2023, 11:21 AM

## 2023-12-13 NOTE — Op Note (Addendum)
 [facility] Patient Name: Tony Cantu Procedure Date : 12/13/2023 MRN: [id number] Attending MD: Particia Lather , , [id number] Date of Birth: [birth year] CSN: [id number] Age: 73 Admit Type: Inpatient Procedure:                Upper GI endoscopy Indications:              Coffee-ground emesis, Melena, Anemia Providers:                Madelyn Brunner" Waldon Reining, Technician Referring MD:             Hospitalist team Medicines:                Monitored Anesthesia Care Complications:            No immediate complications. Estimated Blood Loss:     Estimated blood loss was minimal. Procedure:                Pre-Anesthesia Assessment:                           - Prior to the procedure, a History and Physical                            was performed, and patient medications and                            allergies were reviewed. The patient's tolerance of                            previous anesthesia was also reviewed. The risks                            and benefits of the procedure and the sedation                            options and risks were discussed with the patient.                            All questions were answered, and informed consent                            was obtained. Prior Anticoagulants: The patient has                            taken Eliquis (apixaban), last dose was 5 days                            prior to procedure. ASA Grade Assessment: III - A                            patient with severe systemic disease. After  reviewing the risks and benefits, the patient was                            deemed in satisfactory condition to undergo the                            procedure.                           After obtaining informed consent, the endoscope was                            passed under direct vision. Throughout the                            procedure,  the patient's blood pressure, pulse, and                            oxygen saturations were monitored continuously. The                            [id number]-[id number] ([id number]) Olympus endoscope was introduced                            through the mouth, and advanced to the second part                            of duodenum. The upper GI endoscopy was                            accomplished without difficulty. The patient                            tolerated the procedure well. Scope In: Scope Out: Findings:      The examined esophagus was normal.      Portal hypertensive gastropathy was found in the gastric body.      A single 5 mm angioectasia with no bleeding was found in the gastric       antrum. Coagulation for bleeding prevention using argon plasma at 2       liters/minute and 20 watts was successful.      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 Coagulation for bleeding prevention using argon plasma       at 2 liters/minute and 20 watts was successful.      Two small angioectasias with bleeding were found in the second portion       of the duodenum. Coagulation for bleeding prevention using argon plasma       at 1 liter/minute and 20 watts was successful. Impression:               - Normal esophagus.                           - Portal hypertensive gastropathy.                           -  A single non-bleeding angioectasia in the                            stomach. Treated with argon plasma coagulation                            (APC).                           - Gastric antral vascular ectasia without bleeding.                            Treated with argon plasma coagulation (APC).                           - Two bleeding angioectasias in the duodenum.                            Treated with argon plasma coagulation (APC).                           - No specimens collected. Recommendation:           - Return patient to hospital ward for  ongoing care.                           - Cont PPI BID (patient was on this as an                            outpatient).                           - Okay to restart Eliquis tomorrow.                           - Check H pylori stool antigen.                           - We will arrange for outpatient GI follow up.                           - The findings and recommendations were discussed                            with the patient. Procedure Code(s):        --- Professional ---                           [phone], Esophagogastroduodenoscopy, flexible,                            transoral; with control of bleeding, any method Diagnosis Code(s):        --- Professional ---                           K76.6, Portal hypertension  K31.89, Other diseases of stomach and duodenum                           K31.811, Angiodysplasia of stomach and duodenum                            with bleeding                           K92.0, Hematemesis                           K92.1, Melena (includes Hematochezia)                           D64.9, Anemia, unspecified CPT copyright 2022 American Medical Association. All rights reserved. The codes documented in this report are preliminary and upon coder review may  be revised to meet current compliance requirements. Dr Pedro Bourgeois "Anastacio Balm" Colon,  12/13/2023 1:06:24 PM Number of Addenda: 0

## 2023-12-13 NOTE — Telephone Encounter (Signed)
 Patient has been scheduled to see Tony Cantu on 01/16/24 at 10 am. Will reach out to let patient know about follow up appointment following his hospital discharge.

## 2023-12-13 NOTE — Telephone Encounter (Signed)
===  View-only below this line=== ----- Message ----- From: Daina Drum, MD Sent: 12/13/2023   1:11 PM EDT To: [facility] Pod A Triage  Hi Pod A triage, let's arrange for GI clinic follow up with Dr. Bridgett Camps or APP in 1-2 months for MASH cirrhosis, recent hospitalization for GI bleed and hepatic encephalopathy.

## 2023-12-13 NOTE — Consult Note (Signed)
 WOC Nurse Consult Note: patient has been using a rectal pouch per bedside nurse with surrounding MASD  Reason for Consult: irritant contact dermatitis  Wound type: Moisture Associated Skin Damage to buttocks ICD-10 CM Codes for Irritant Dermatitis  L24A2 - Due to fecal, urinary or dual incontinence  Pressure Injury POA: NA  Measurement: widespread to buttocks  Wound bed: erythema with scattered partial thickness skin loss due to friction and moisture  Drainage (amount, consistency, odor) see nursing flowsheet  Periwound: erythema  Dressing procedure/placement/frequency: Cleanse buttock/sacrum/coccyx with Vashe wound cleanser Timm Foot [phone]), do not rinse and allow to air dry. Apply Gerhardt's Butt Cream 3 times a day and prn soiling.    POC discussed with bedside nurse. Appreciate Shelly Diamond assistance with this consult.   WOC team will not follow. Re-consult if further needs arise.   Thank you,    Ronni Colace MSN, RN-BC, [employer] [phone]

## 2023-12-13 NOTE — Progress Notes (Signed)
 PROGRESS NOTE                                                                                                                                                                                                             Patient Demographics:    Tony Cantu, is a 73 y.o. male, DOB - 03-14-1951, RUE:[id number]  Outpatient Primary MD for the patient is [name], DO    LOS - 5  Admit date - 12/08/2023    Chief Complaint  Patient presents with   Fatigue       Brief Narrative (HPI from H&P)     73 y.o. male with medical history significant of hypertension, hyperlipidemia, diabetes, paroxysmal A-fib, paroxysmal tachycardia, GERD, COPD/asthma, chronic diastolic CHF, NAFLD, cirrhosis, portal hypertension, obesity, OSA, BPH, presenting with altered mental status and nausea vomiting and dark stool.   History team with assistance of chart review and EMS report.  Patient reportedly at facility after fall.  Patient has been acting differently for the past 1 to 2 days.  He has had increased fatigue and somnolence.  Reportedly has had nausea and vomiting with black emesis and now black stools.  He will nod to answer questions but has not been speaking.  Further workup was consistent with severe hepatic encephalopathy and possible upper GI bleed, GI was consulted and he was admitted.   Subjective:   Patient denies any complaints today, no significant events overnight.     Assessment  & Plan :   NASH cirrhosis with portal hypertension Allena, coffee-ground emesis on admission History of GAVE GI bleed - History of cirrhosis with portal hypertension/varices, with melena as the presenting symptom. -Consult greatly appreciated, plan for endoscopy today.  While keep Eliquis on hold, hopefully can resume if endoscopy looks okay   Hyperammonemia Acute hepatic encephalopathy NASH cirrhosis -  improving with high-dose lactulose,  Xifaxan, Monia level is trending down, mentation continues to improve, no focal deficits, head CT stable.   Hypertension  - Not on any antihypertensives   Hyperlipidemia  - Resume atorvastatin when tolerating p.o.   Paroxysmal atrial fibrillation   - Resume amiodarone, with underlying history of [name] will defer it to his primary cardiologist on long-term use of amiodarone. Eliquis held/reversed as above.  As needed IV Cardizem   GERD  - On IV PPI  COPD/asthma  - Resume Singulair and tolerating p.o.  Not on inhalers, no acute issues   Chronic diastolic CHF  > Last echocardiogram, Was TEE in January 2025 which showed EF 60-65%, normal RV function.  Not on diuretic,  Continue to monitor   QTc prolongation > With underlying right bundle branch block, hard to discern correct QTc.  Place electrolytes and monitor on telemetry.   Hypernatremia.  Failed to keep up with oral hydration, gentle D5W on 12/12/2023, insulin adjusted as he is expected to run high sugars.  Monitor.  Hypokalemia Hypomagnesemia - Repleted  Diabetes  -D5W while n.p.o. and hypernatremic, placed on Lantus twice daily and SSI modified for better control  Lab Results  Component Value Date   HGBA1C 4.4 (L) 09/19/2023   CBG (last 3)  Recent Labs    12/13/23 0006 12/13/23 0429 12/13/23 0905  GLUCAP 165* 76 94         Condition - Extremely Guarded  Family Communication  : none at bedside  Code Status : DNR  Consults  :  GI  PUD Prophylaxis : PPI   Procedures  :     CT head nonacute.   CT   abdomen pelvis.   1. Mild circumferential thickening of the rectum with mild to moderate perirectal fat stranding and trace presacral fluid, favoring proctitis. 2. Otherwise, no acute inflammatory process identified within the abdomen or pelvis. 3. Cirrhotic liver with multiple venous collaterals, suggesting sequela of portal hypertension. No splenomegaly or ascites. 4. Multiple other nonacute observations, as described  above. Aortic Atherosclerosis (ICD10-I70.0).      Disposition Plan  :    Status is: Inpatient  DVT Prophylaxis  :    Place and maintain sequential compression device Start: 12/09/23 0555 SCDs Start: 12/08/23 1707    Lab Results  Component Value Date   PLT 123 (L) 12/13/2023    Diet :  Diet Order             Diet NPO time specified  Diet effective midnight                    Inpatient Medications  Scheduled Meds:  amiodarone  200 mg Oral Daily   atorvastatin  40 mg Oral q1800   Gerhardt's butt cream   Topical TID   insulin aspart  0-20 Units Subcutaneous Q4H   insulin glargine-yfgn  25 Units Subcutaneous BID   lactulose  30 g Oral TID   pantoprazole (PROTONIX) IV  40 mg Intravenous Q12H   rifaximin  550 mg Oral BID   sodium chloride flush  3 mL Intravenous Q12H   Continuous Infusions:  magnesium sulfate bolus IVPB 2 g (12/13/23 0830)   potassium chloride 10 mEq (12/13/23 0828)   PRN Meds:.acetaminophen **OR** acetaminophen, diltiazem  Antibiotics  :    Anti-infectives (From admission, onward)    Start     Dose/Rate Route Frequency Ordered Stop   12/10/23 2200  rifaximin (XIFAXAN) tablet 550 mg        550 mg Oral 2 times daily 12/10/23 1815     12/09/23 1000  rifaximin (XIFAXAN) tablet 550 mg  Status:  Discontinued        550 mg Per Tube 2 times daily 12/09/23 0827 12/10/23 1815         Objective:   Vitals:   12/12/23 0800 12/13/23 0035 12/13/23 0400 12/13/23 0906  BP: (!) 109/96 100/71 104/68 91/60  Pulse: 71  61 63  Resp: (!)  24 16 17 18   Temp: 97.7 F (36.5 C) 98 F (36.7 C) 97.8 F (36.6 C) 98.1 F (36.7 C)  TempSrc:  Oral Axillary Oral  SpO2: 99% 99% 96% 97%  Weight:      Height:        Wt Readings from Last 3 Encounters:  12/09/23 75.1 kg  11/14/23 98.4 kg  11/03/23 85.2 kg     Intake/Output Summary (Last 24 hours) at 12/13/2023 1610 Last data filed at 12/13/2023 0400 Gross per 24 hour  Intake 2511.98 ml  Output 2300 ml   Net 211.98 ml     Physical Exam  Awake Alert, Oriented X 3, frail, deconditioned Symmetrical Chest Pothier movement, Good air movement bilaterally, CTAB RRR,No Gallops,Rubs or new Murmurs, No Parasternal Heave +ve B.Sounds, Abd Soft, No tenderness, No rebound - guarding or rigidity. No Cyanosis, Clubbing or edema, No new Rash or bruise       Data Review:    Recent Labs  Lab 12/08/23 1332 12/08/23 1406 12/09/23 0648 12/10/23 0813 12/11/23 0331 12/12/23 0827 12/13/23 0339  WBC 5.8   < > 5.7 6.4 8.9 7.2 7.2  HGB 10.6*   < > 10.6* 10.4* 11.1* 11.3* 10.6*  HCT 32.4*   < > 31.6* 31.5* 34.4* 35.6* 32.8*  PLT 161   < > 161 155 172 154 123*  MCV 106.2*   < > 104.3* 106.1* 109.2* 109.9* 109.7*  MCH 34.8*   < > 35.0* 35.0* 35.2* 34.9* 35.5*  MCHC 32.7   < > 33.5 33.0 32.3 31.7 32.3  RDW 17.3*   < > 17.3* 17.7* 17.9* 18.0* 17.4*  LYMPHSABS 1.1  --   --  1.2 1.3 1.4 1.7  MONOABS 0.7  --   --  0.7 1.1* 0.7 0.7  EOSABS 0.0  --   --  0.1 0.0 0.2 0.2  BASOSABS 0.0  --   --  0.1 0.0 0.1 0.0   < > = values in this interval not displayed.    Recent Labs  Lab 12/08/23 1406 12/08/23 1705 12/08/23 1843 12/09/23 9604 12/10/23 0813 12/11/23 0331 12/12/23 0343 12/13/23 0339  NA 142  --   --  144 145 149* 151* 140  K 3.3*  --   --  3.1* 2.9* 3.9 3.4* 3.3*  CL  --   --   --  112* 114* 119* 121* 113*  CO2  --   --   --  22 22 22 23 23   ANIONGAP  --   --   --  10 9 8 7  4*  GLUCOSE  --   --   --  117* 293* 327* 87 102*  BUN  --   --   --  8 6* 7* 5* <5*  CREATININE  --   --   --  0.85 0.86 1.10 0.97 0.82  AST  --   --   --  64* 46* 50* 36 40  ALT  --   --   --  45* 41 47* 43 38  ALKPHOS  --   --   --  90 80 92 94 84  BILITOT  --   --   --  1.1 0.9 1.1 1.1 1.1  ALBUMIN  --   --   --  2.4* 2.2* 2.3* 2.3* 2.0*  LATICACIDVEN 1.6  --   --   --   --   --   --   --   INR  --  1.9*  --   --  1.3*  --   --   --   TSH  --   --  1.469  --   --   --   --   --   AMMONIA  --   --   --  154* 61*  55* 53* 52*  BNP  --   --   --  420.3* 423.0* 351.3* 388.1* 334.3*  MG  --   --   --  1.8 1.8 2.0 1.9 1.5*  CALCIUM  --   --   --  9.3 9.1 9.7 9.9 9.1      Recent Labs  Lab 12/08/23 1406 12/08/23 1705 12/08/23 1843 12/09/23 0648 12/10/23 0813 12/11/23 0331 12/12/23 0343 12/13/23 0339  LATICACIDVEN 1.6  --   --   --   --   --   --   --   INR  --  1.9*  --   --  1.3*  --   --   --   TSH  --   --  1.469  --   --   --   --   --   AMMONIA  --   --   --  154* 61* 55* 53* 52*  BNP  --   --   --  420.3* 423.0* 351.3* 388.1* 334.3*  MG  --   --   --  1.8 1.8 2.0 1.9 1.5*  CALCIUM  --   --   --  9.3 9.1 9.7 9.9 9.1    --------------------------------------------------------------------------------------------------------------- Lab Results  Component Value Date   CHOL 126 01/04/2022   HDL 57 01/04/2022   LDLCALC 55 01/04/2022   TRIG 67 01/04/2022   CHOLHDL 2.2 01/04/2022    Lab Results  Component Value Date   HGBA1C 4.4 (L) 09/19/2023   No results for input(s): "TSH", "T4TOTAL", "FREET4", "T3FREE", "THYROIDAB" in the last 72 hours.    Radiology Report No results found.    Signature  -   Seena Dadds M.D on 12/13/2023 at 9:22 AM   -  To page go to [email or web address]

## 2023-12-13 NOTE — TOC Progression Note (Signed)
 Transition of Care [facility]) - Progression Note    Patient Details  Name: Tony Cantu MRN: [id number] Date of Birth: 1951/08/29  [facility] CM/SW Contact  Jannice Mends, LCSW Phone Number: 12/13/2023, 3:34 PM  Clinical Narrative:    CSW continuing to follow.    Expected Discharge Plan: Skilled Nursing Facility Barriers to Discharge: Continued Medical Work up, English as a second language teacher  Expected Discharge Plan and Services In-house Referral: Clinical Social Work   Post Acute Care Choice: Skilled Nursing Facility Living arrangements for the past 2 months: Single Family Home, Skilled Nursing Facility                                       Social Determinants of Health (SDOH) Interventions SDOH Screenings   Food Insecurity: Patient Unable To Answer (12/09/2023)  Housing: Patient Unable To Answer (12/09/2023)  Recent Concern: Housing - High Risk (10/30/2023)  Transportation Needs: Patient Unable To Answer (12/09/2023)  Utilities: Patient Unable To Answer (12/09/2023)  Depression (PHQ2-9): Medium Risk (09/13/2023)  Financial Resource Strain: Low Risk  (06/09/2022)  Social Connections: Patient Unable To Answer (12/09/2023)  Recent Concern: Social Connections - Socially Isolated (10/30/2023)  Tobacco Use: Medium Risk (12/08/2023)    Readmission Risk Interventions     No data to display

## 2023-12-13 NOTE — Transfer of Care (Signed)
 Immediate Anesthesia Transfer of Care Note  Patient: Tony Cantu  Procedure(s) Performed: EGD (ESOPHAGOGASTRODUODENOSCOPY) EGD, WITH ARGON PLASMA COAGULATION  Patient Location: Endoscopy Unit  Anesthesia Type:General  Level of Consciousness: drowsy and patient cooperative  Airway & Oxygen Therapy: Patient Spontanous Breathing  Post-op Assessment: Report given to RN and Post -op Vital signs reviewed and stable  Post vital signs: Reviewed and stable; patient responsive but sleepy  Last Vitals:  Vitals Value Taken Time  BP 87/59 12/13/23 1253  Temp    Pulse 78 12/13/23 1255  Resp 15 12/13/23 1255  SpO2 94 % 12/13/23 1255  Vitals shown include unfiled device data.  Last Pain:  Vitals:   12/13/23 1108  TempSrc: Temporal  PainSc: 0-No pain         Complications: No notable events documented.

## 2023-12-13 NOTE — Anesthesia Procedure Notes (Signed)
 Procedure Name: Intubation Date/Time: 12/13/2023 12:26 PM  Performed by: Grier Leber, CRNAPre-anesthesia Checklist: Patient identified, Emergency Drugs available, Suction available and Patient being monitored Patient Re-evaluated:Patient Re-evaluated prior to induction Oxygen Delivery Method: Circle System Utilized Preoxygenation: Pre-oxygenation with 100% oxygen Induction Type: IV induction Ventilation: Mask ventilation without difficulty Laryngoscope Size: Glidescope and 3 Grade View: Grade II Tube type: Oral Tube size: 7.0 mm Number of attempts: 1 Airway Equipment and Method: Stylet and Oral airway Placement Confirmation: ETT inserted through vocal cords under direct vision, positive ETCO2 and breath sounds checked- equal and bilateral Secured at: 23 cm Tube secured with: Tape Dental Injury: Teeth and Oropharynx as per pre-operative assessment  Difficulty Due To: Difficult Airway- due to large tongue

## 2023-12-13 NOTE — Progress Notes (Signed)
 PT Cancellation Note  Patient Details Name: Tony Cantu MRN: [id number] DOB: 07-Mar-1951   Cancelled Treatment:    Reason Eval/Treat Not Completed: Patient at procedure or test/unavailable (Pt off floor for EGD. Will follow-up as schedule permits.)  Glenford Lanes, PT, DPT Acute Rehabilitation Services Office: [phone] Secure Chat Preferred  [name] 12/13/2023, 11:03 AM

## 2023-12-14 DIAGNOSIS — K922 Gastrointestinal hemorrhage, unspecified: Secondary | ICD-10-CM | POA: Diagnosis not present

## 2023-12-14 LAB — COMPREHENSIVE METABOLIC PANEL WITH GFR
ALT: 200 U/L — ABNORMAL HIGH (ref 0–44)
AST: 426 U/L — ABNORMAL HIGH (ref 15–41)
Albumin: 2.2 g/dL — ABNORMAL LOW (ref 3.5–5.0)
Alkaline Phosphatase: 182 U/L — ABNORMAL HIGH (ref 38–126)
Anion gap: 11 (ref 5–15)
BUN: 5 mg/dL — ABNORMAL LOW (ref 8–23)
CO2: 24 mmol/L (ref 22–32)
Calcium: 9.3 mg/dL (ref 8.9–10.3)
Chloride: 106 mmol/L (ref 98–111)
Creatinine, Ser: 0.85 mg/dL (ref 0.61–1.24)
GFR, Estimated: >60 mL/min (ref >60)
Glucose, Bld: 154 mg/dL — ABNORMAL HIGH (ref 70–99)
Potassium: 3.7 mmol/L (ref 3.5–5.1)
Sodium: 141 mmol/L (ref 135–145)
Total Bilirubin: 1.1 mg/dL (ref 0.0–1.2)
Total Protein: 5.9 g/dL — ABNORMAL LOW (ref 6.5–8.1)

## 2023-12-14 LAB — CBC WITH DIFFERENTIAL/PLATELET
Abs Immature Granulocytes: 0.06 10*3/uL (ref 0.00–0.07)
Basophils Absolute: 0 10*3/uL (ref 0.0–0.1)
Basophils Relative: 0 %
Eosinophils Absolute: 0.1 10*3/uL (ref 0.0–0.5)
Eosinophils Relative: 1 %
HCT: 36.1 % — ABNORMAL LOW (ref 39.0–52.0)
Hemoglobin: 11.9 g/dL — ABNORMAL LOW (ref 13.0–17.0)
Immature Granulocytes: 1 %
Lymphocytes Relative: 8 %
Lymphs Abs: 0.7 10*3/uL (ref 0.7–4.0)
MCH: 35.2 pg — ABNORMAL HIGH (ref 26.0–34.0)
MCHC: 33 g/dL (ref 30.0–36.0)
MCV: 106.8 fL — ABNORMAL HIGH (ref 80.0–100.0)
Monocytes Absolute: 0.8 10*3/uL (ref 0.1–1.0)
Monocytes Relative: 10 %
Neutro Abs: 6.4 10*3/uL (ref 1.7–7.7)
Neutrophils Relative %: 80 %
Platelets: 118 10*3/uL — ABNORMAL LOW (ref 150–400)
RBC: 3.38 MIL/uL — ABNORMAL LOW (ref 4.22–5.81)
RDW: 17.1 % — ABNORMAL HIGH (ref 11.5–15.5)
WBC: 8 10*3/uL (ref 4.0–10.5)
nRBC: 0.3 % — ABNORMAL HIGH (ref 0.0–0.2)

## 2023-12-14 LAB — MAGNESIUM: Magnesium: 1.7 mg/dL (ref 1.7–2.4)

## 2023-12-14 LAB — GLUCOSE, CAPILLARY
Glucose-Capillary: 110 mg/dL — ABNORMAL HIGH (ref 70–99)
Glucose-Capillary: 142 mg/dL — ABNORMAL HIGH (ref 70–99)
Glucose-Capillary: 155 mg/dL — ABNORMAL HIGH (ref 70–99)
Glucose-Capillary: 195 mg/dL — ABNORMAL HIGH (ref 70–99)
Glucose-Capillary: 195 mg/dL — ABNORMAL HIGH (ref 70–99)
Glucose-Capillary: 310 mg/dL — ABNORMAL HIGH (ref 70–99)
Glucose-Capillary: 66 mg/dL — ABNORMAL LOW (ref 70–99)

## 2023-12-14 LAB — AMMONIA: Ammonia: 24 umol/L (ref 9–35)

## 2023-12-14 MED ORDER — MAGNESIUM SULFATE 2 GM/50ML IV SOLN
2.0000 g | Freq: Once | INTRAVENOUS | Status: AC
  Administered 2023-12-14: 2 g via INTRAVENOUS
  Filled 2023-12-14: qty 50

## 2023-12-14 MED ORDER — POTASSIUM CHLORIDE CRYS ER 20 MEQ PO TBCR
40.0000 meq | EXTENDED_RELEASE_TABLET | Freq: Once | ORAL | Status: AC
  Administered 2023-12-14: 40 meq via ORAL
  Filled 2023-12-14: qty 2

## 2023-12-14 MED ORDER — ALBUMIN HUMAN 25 % IV SOLN
25.0000 g | Freq: Four times a day (QID) | INTRAVENOUS | Status: AC
  Administered 2023-12-14 – 2023-12-15 (×6): 25 g via INTRAVENOUS
  Filled 2023-12-14 (×7): qty 100

## 2023-12-14 MED ORDER — MIDODRINE HCL 5 MG PO TABS
2.5000 mg | ORAL_TABLET | Freq: Three times a day (TID) | ORAL | Status: DC
  Administered 2023-12-14 (×2): 2.5 mg via ORAL
  Filled 2023-12-14 (×2): qty 1

## 2023-12-14 MED ORDER — INSULIN GLARGINE-YFGN 100 UNIT/ML ~~LOC~~ SOLN
20.0000 [IU] | Freq: Two times a day (BID) | SUBCUTANEOUS | Status: DC
  Administered 2023-12-14: 20 [IU] via SUBCUTANEOUS
  Filled 2023-12-14 (×3): qty 0.2

## 2023-12-14 MED ORDER — APIXABAN 5 MG PO TABS
5.0000 mg | ORAL_TABLET | Freq: Two times a day (BID) | ORAL | Status: DC
  Administered 2023-12-14 – 2023-12-16 (×5): 5 mg via ORAL
  Filled 2023-12-14 (×5): qty 1

## 2023-12-14 NOTE — Progress Notes (Signed)
 PROGRESS NOTE                                                                                                                                                                                                             Patient Demographics:    Tony Cantu, is a 73 y.o. male, DOB - Apr 18, 1951, ZOX:[id number]  Outpatient Primary MD for the patient is Tony Ip, DO    LOS - 6  Admit date - 12/08/2023    Chief Complaint  Patient presents with   Fatigue       Brief Narrative (HPI from H&P)     73 y.o. male with medical history significant of hypertension, hyperlipidemia, diabetes, paroxysmal A-fib, paroxysmal tachycardia, GERD, COPD/asthma, chronic diastolic CHF, NAFLD, cirrhosis, portal hypertension, obesity, OSA, BPH, presenting with altered mental status and nausea vomiting and dark stool.  He is with increased fatigue, and somnolence, workup in ED significant for severe hepatic encephalopathy, anemia, GI consulted, patient went for endoscopy 4/16, significant for PHG, 1 gastric angioectasia s/p APC, GAVE s/p APC, and 2 duodenal angioectasias s/p APC.      Subjective:   Patient himself denies any complaints today, apparently was lightheaded with PT today.   Assessment  & Plan :   NASH cirrhosis with portal hypertension Melena, coffee-ground emesis on admission History of GAVE Upper GI bleed due to gastric angiectasia, GAVE, duodenal angiectasia. - History of cirrhosis with portal hypertension/varices, with melena as the presenting symptom. -GI Consult greatly appreciated, went for EGD/16, showed PHG, 1 gastric angioectasia s/p APC, GAVE s/p APC, and 2 duodenal angioectasias s/p APC.  These lesions could be causing his acute GI bleed, . - May resume Eliquis today as discussed with GI , when remained stable . - Continue with PPI twice daily.   - Continue to monitor CBC closely and transfuse as needed    Hyperammonemia Acute hepatic encephalopathy NASH cirrhosis Thrombocytopenia -  improving with high-dose lactulose, Xifaxan, Monia level is trending down, mentation continues to improve, no focal deficits, head CT stable.   Hypertension   - Not on any antihypertensives, actually his blood pressure is soft   Hyperlipidemia  - Discontinue his statin due to elevated LFTs.   Paroxysmal atrial fibrillation   - Resume amiodarone, with underlying history of [name] will defer it to his primary cardiologist on long-term  use of amiodarone. Eliquis held/reversed as above.  As needed IV Cardizem -Resume Eliquis  Transaminitis -LFTs are elevated this morning, no clear etiology, but overall his blood pressure has been on the lower side, will avoid low blood pressure, discontinue statin, avoid Tylenol, repeat in a.m.  Hypotension -patient blood pressure is soft, symptomatic with dizziness lightheadedness upon standing with PT today, avoid hypotension , will start on low-dose midodrine and will give albumin. - Will start on TED hose as well.    GERD  - On IV PPI   COPD/asthma  - Resume Singulair and tolerating p.o.  Not on inhalers, no acute issues   Chronic diastolic CHF  > Last echocardiogram, Was TEE in January 2025 which showed EF 60-65%, normal RV function.  Not on diuretic,  Continue to monitor   QTc prolongation > With underlying right bundle branch block, hard to discern correct QTc.  replace electrolytes and monitor on telemetry.   Hypernatremia.  Failed to keep up with oral hydration, gentle D5W on 12/12/2023, insulin adjusted as he is expected to run high sugars.  Monitor.  Hypokalemia Hypomagnesemia - Repleted  Diabetes  -CBG on the lower side, will decrease Lantus to 20 units twice daily, continue with insulin sliding scale  Lab Results  Component Value Date   HGBA1C 4.4 (L) 09/19/2023   CBG (last 3)  Recent Labs    12/14/23 0421 12/14/23 0806 12/14/23 1159  GLUCAP 155*  66* 110*         Condition - Extremely Guarded  Family Communication  : none at bedside  Code Status : DNR  Consults  :  GI  PUD Prophylaxis : PPI   Procedures  :     CT head nonacute.   CT   abdomen pelvis.   1. Mild circumferential thickening of the rectum with mild to moderate perirectal fat stranding and trace presacral fluid, favoring proctitis. 2. Otherwise, no acute inflammatory process identified within the abdomen or pelvis. 3. Cirrhotic liver with multiple venous collaterals, suggesting sequela of portal hypertension. No splenomegaly or ascites. 4. Multiple other nonacute observations, as described above. Aortic Atherosclerosis (ICD10-I70.0).      Disposition Plan  :    Status is: Inpatient  DVT Prophylaxis  :    Place TED hose Start: 12/14/23 1312 Place and maintain sequential compression device Start: 12/09/23 0555 SCDs Start: 12/08/23 1707    Lab Results  Component Value Date   PLT 118 (L) 12/14/2023    Diet :  Diet Order             Diet clear liquid Room service appropriate? Yes; Fluid consistency: Thin  Diet effective now                    Inpatient Medications  Scheduled Meds:  amiodarone  200 mg Oral Daily   Gerhardt's butt cream   Topical TID   insulin aspart  0-20 Units Subcutaneous Q4H   insulin glargine-yfgn  25 Units Subcutaneous BID   lactulose  30 g Oral TID   pantoprazole (PROTONIX) IV  40 mg Intravenous Q12H   rifaximin  550 mg Oral BID   sodium chloride flush  3 mL Intravenous Q12H   Continuous Infusions:  albumin human     PRN Meds:.acetaminophen **OR** acetaminophen, diltiazem  Antibiotics  :    Anti-infectives (From admission, onward)    Start     Dose/Rate Route Frequency Ordered Stop   12/10/23 2200  rifaximin (XIFAXAN) tablet 550  mg        550 mg Oral 2 times daily 12/10/23 1815     12/09/23 1000  rifaximin (XIFAXAN) tablet 550 mg  Status:  Discontinued        550 mg Per Tube 2 times daily 12/09/23 0827  12/10/23 1815         Objective:   Vitals:   12/14/23 0418 12/14/23 0804 12/14/23 1200 12/14/23 1300  BP: 95/73 99/67 103/72 104/78  Pulse: 90 84  88  Resp: 20     Temp: 97.7 F (36.5 C) 98 F (36.7 C) 98 F (36.7 C)   TempSrc: Oral Oral Oral   SpO2: 96% 98%  100%  Weight:      Height:        Wt Readings from Last 3 Encounters:  12/09/23 75.1 kg  11/14/23 98.4 kg  11/03/23 85.2 kg     Intake/Output Summary (Last 24 hours) at 12/14/2023 1321 Last data filed at 12/14/2023 1202 Gross per 24 hour  Intake 243 ml  Output 1700 ml  Net -1457 ml     Physical Exam  Awake Alert, Oriented X 3, frail, deconditioned Symmetrical Chest Moe movement, Good air movement bilaterally, CTAB RRR,No Gallops,Rubs or new Murmurs, No Parasternal Heave +ve B.Sounds, Abd Soft, No tenderness, No rebound - guarding or rigidity. No Cyanosis, Clubbing or edema, No new Rash or bruise        Data Review:    Recent Labs  Lab 12/10/23 0813 12/11/23 0331 12/12/23 0827 12/13/23 0339 12/14/23 0352  WBC 6.4 8.9 7.2 7.2 8.0  HGB 10.4* 11.1* 11.3* 10.6* 11.9*  HCT 31.5* 34.4* 35.6* 32.8* 36.1*  PLT 155 172 154 123* 118*  MCV 106.1* 109.2* 109.9* 109.7* 106.8*  MCH 35.0* 35.2* 34.9* 35.5* 35.2*  MCHC 33.0 32.3 31.7 32.3 33.0  RDW 17.7* 17.9* 18.0* 17.4* 17.1*  LYMPHSABS 1.2 1.3 1.4 1.7 0.7  MONOABS 0.7 1.1* 0.7 0.7 0.8  EOSABS 0.1 0.0 0.2 0.2 0.1  BASOSABS 0.1 0.0 0.1 0.0 0.0    Recent Labs  Lab 12/08/23 1332 12/08/23 1406 12/08/23 1705 12/08/23 1843 12/09/23 0648 12/10/23 0813 12/11/23 0331 12/12/23 0343 12/13/23 0339 12/14/23 0352  NA  --  142  --   --  144 145 149* 151* 140 141  K  --  3.3*  --   --  3.1* 2.9* 3.9 3.4* 3.3* 3.7  CL  --   --   --   --  112* 114* 119* 121* 113* 106  CO2  --   --   --   --  22 22 22 23 23 24   ANIONGAP  --   --   --   --  10 9 8 7  4* 11  GLUCOSE  --   --   --   --  117* 293* 327* 87 102* 154*  BUN  --   --   --   --  8 6* 7* 5* <5* 5*   CREATININE  --   --   --   --  0.85 0.86 1.10 0.97 0.82 0.85  AST  --   --   --   --  64* 46* 50* 36 40 426*  ALT  --   --   --   --  45* 41 47* 43 38 200*  ALKPHOS  --   --   --   --  90 80 92 94 84 182*  BILITOT  --   --   --   --  1.1 0.9 1.1 1.1 1.1 1.1  ALBUMIN  --   --   --   --  2.4* 2.2* 2.3* 2.3* 2.0* 2.2*  LATICACIDVEN  --  1.6  --   --   --   --   --   --   --   --   INR  --   --  1.9*  --   --  1.3*  --   --   --   --   TSH  --   --   --  1.469  --   --   --   --   --   --   AMMONIA  --   --   --   --  154* 61* 55* 53* 52* 24  BNP  --   --   --   --  420.3* 423.0* 351.3* 388.1* 334.3*  --   MG   < >  --   --   --  1.8 1.8 2.0 1.9 1.5* 1.7  CALCIUM  --   --   --   --  9.3 9.1 9.7 9.9 9.1 9.3   < > = values in this interval not displayed.      Recent Labs  Lab 12/08/23 1332 12/08/23 1406 12/08/23 1705 12/08/23 1843 12/09/23 1308 12/10/23 0813 12/11/23 0331 12/12/23 0343 12/13/23 0339 12/14/23 0352  LATICACIDVEN  --  1.6  --   --   --   --   --   --   --   --   INR  --   --  1.9*  --   --  1.3*  --   --   --   --   TSH  --   --   --  1.469  --   --   --   --   --   --   AMMONIA  --   --   --   --  154* 61* 55* 53* 52* 24  BNP  --   --   --   --  420.3* 423.0* 351.3* 388.1* 334.3*  --   MG   < >  --   --   --  1.8 1.8 2.0 1.9 1.5* 1.7  CALCIUM  --   --   --   --  9.3 9.1 9.7 9.9 9.1 9.3   < > = values in this interval not displayed.    --------------------------------------------------------------------------------------------------------------- Lab Results  Component Value Date   CHOL 126 01/04/2022   HDL 57 01/04/2022   LDLCALC 55 01/04/2022   TRIG 67 01/04/2022   CHOLHDL 2.2 01/04/2022    Lab Results  Component Value Date   HGBA1C 4.4 (L) 09/19/2023   No results for input(s): "TSH", "T4TOTAL", "FREET4", "T3FREE", "THYROIDAB" in the last 72 hours.    Radiology Report No results found.    Signature  -   Seena Dadds M.D on 12/14/2023 at 1:21  PM   -  To page go to [email or web address]

## 2023-12-14 NOTE — Progress Notes (Signed)
 Physical Therapy Treatment Patient Details Name: Tony Cantu MRN: [id number] DOB: May 30, 1951 Today's Date: 12/14/2023   History of Present Illness Patient is 73 yo male admitted on 12/08/23 for AMS, nausea, vomiting, and dark stool. PMHx significant for type 1 diabetes, A-fib on Eliquis, seizure disorder, aortic stenosis, HTN, peptic ulcer disease, OA with bilateral knee replacement, polyclonal gammopathy, OSA on CPAP, GERD, COPD/asthma, chronic diastolic CHF, cirrhosis, portal hypertension, and BPH.    PT Comments  Pt received in supine, awakens to voice and A&O x1-2, pt with improved command following for simple motor tasks but benefits from multimodal cues and increased time to process. Pt agreeable to therapy session, with good participation in bed mobility and needing up to maxA to transfer to EOB and +2 assist for return to supine due to complaint of dizziness with upright seated posture ~3-4 mins. Attempted seated BP but unable to obtain due to pt restlessness, SpO2/HR WFL on RA. Patient will benefit from continued inpatient follow up therapy, <3 hours/day, he is participating better and making good progress toward his PT goals. Vital Signs  Pulse Rate 88  Pulse Rate Source Monitor  BP 104/78 (after return to supine)  BP Location Left Arm  BP Method Automatic  Patient Position (if appropriate) Lying (slightly sidelying toward his R, HOB ~15 deg)  Oxygen Therapy  SpO2 100 %  O2 Device Room Air  Patient Activity (if Appropriate) In bed  Pulse Oximetry Type Continuous  Oximetry Probe Site Changed Yes (better signal on earlobe; fingers were too cold)     If plan is discharge home, recommend the following: Two people to help with walking and/or transfers;A lot of help with bathing/dressing/bathroom;Assistance with cooking/housework;Direct supervision/assist for medications management;Direct supervision/assist for financial management;Assist for transportation;Supervision due to  cognitive status   Can travel by private vehicle     No  Equipment Recommendations  None recommended by PT (TBD)    Recommendations for Other Services       Precautions / Restrictions Precautions Precautions: Fall Recall of Precautions/Restrictions: Impaired Precaution/Restrictions Comments: watch BP; recent loose stools Restrictions Weight Bearing Restrictions Per Provider Order: No     Mobility  Bed Mobility Overal bed mobility: Needs Assistance Bed Mobility: Rolling, Sidelying to Sit, Sit to Sidelying Rolling: Min assist, Used rails, Mod assist Sidelying to sit: Max assist, +2 for safety/equipment, HOB elevated, Used rails     Sit to sidelying: Max assist, +2 for physical assistance General bed mobility comments: Good initiation to roll to his R to transfer to EOB, pt using bed rail to assist, increased assist to roll toward his L side after return to supine when rolling for linen removal/hygiene assist. MaxA +1 for trunk lifting with pt able to assist using BUE on bed side rail/mattress with good effort, tactile and verbal cues throughout for BLE placement and sequencing. Pt c/o feeling dizzy upon sitting up and tolerated ~4 mins EOB while seated BP attempted but no accurate reading obtained.    Transfers Overall transfer level: Needs assistance                 General transfer comment: defer for pt safety, pt c/o severe dizziness or lightheadedness with seated posture and continuously leaning toward his R side to [facility] in attempts to return to supine. Anticipate pt would need hoyer lift vs lateral scoot for transfers in next session if BP more stable/symptoms of dizziness resolved.    Ambulation/Gait  Stairs             Wheelchair Mobility     Tilt Bed    Modified Rankin (Stroke Patients Only)       Balance Overall balance assessment: Needs assistance Sitting-balance support: Feet supported, Bilateral upper extremity  supported Sitting balance-Leahy Scale: Poor   Postural control: Right lateral lean     Standing balance comment: not able to assess today                            Communication Communication Communication: No apparent difficulties  Cognition Arousal: Alert Behavior During Therapy: Flat affect   PT - Cognitive impairments: No family/caregiver present to determine baseline, Difficult to assess, Orientation, Awareness, Memory, Attention, Initiation, Sequencing, Problem solving, Safety/Judgement   Orientation impairments: Time, Situation, Person                   PT - Cognition Comments: Pt initially sleeping but opens eyes when cued and mostly maintains eyes open during session, responding to commands with some delay and multimodal cues for body mechanics for bed mobility. Pt able to read white board to give month/year but not correctly stating his DOB when prompted. Pt states "[place]" for location. Following commands: Impaired Following commands impaired: Only follows one step commands consistently, Follows one step commands with increased time    Cueing Cueing Techniques: Verbal cues, Tactile cues, Gestural cues  Exercises Other Exercises Other Exercises: supine BLE AAROM: hip/knee flex/ext x5 reps ea    General Comments General comments (skin integrity, edema, etc.): RN notified his fecal tube appears broken open while pt sitting EOB; see BP in comemnts above      Pertinent Vitals/Pain Pain Assessment Pain Assessment: PAINAD Breathing: normal Negative Vocalization: occasional moan/groan, low speech, negative/disapproving quality Facial Expression: facial grimacing Body Language: tense, distressed pacing, fidgeting Consolability: distracted or reassured by voice/touch PAINAD Score: 5 Pain Location: not localized other than BUE with BP cuff activation but increased grimacing with transfer to EOB Pain Descriptors / Indicators: Discomfort, Grimacing,  Guarding, Sore Pain Intervention(s): Limited activity within patient's tolerance, Monitored during session, Repositioned, Utilized relaxation techniques, Other (comment) (pillows beween his knees when in sidelying posture, behind his L lower back at end of session for pressure relief)    Home Living                          Prior Function            PT Goals (current goals can now be found in the care plan section) Acute Rehab PT Goals Patient Stated Goal: none stated PT Goal Formulation: Patient unable to participate in goal setting Time For Goal Achievement: 12/23/23 Progress towards PT goals: Progressing toward goals    Frequency    Min 1X/week      PT Plan      Co-evaluation              AM-PAC PT "6 Clicks" Mobility   Outcome Measure  Help needed turning from your back to your side while in a flat bed without using bedrails?: A Lot Help needed moving from lying on your back to sitting on the side of a flat bed without using bedrails?: A Lot Help needed moving to and from a bed to a chair (including a wheelchair)?: Total Help needed standing up from a chair using your arms (e.g., wheelchair or bedside  chair)?: Total Help needed to walk in hospital room?: Total Help needed climbing 3-5 steps with a railing? : Total 6 Click Score: 8    End of Session   Activity Tolerance: Patient tolerated treatment well;Other (comment);Treatment limited secondary to medical complications (Comment) (low BP/dizzy) Patient left: in bed;with call bell/phone within reach;with bed alarm set;Other (comment) (prevalon boots donned) Nurse Communication: Mobility status;Other (comment) (fecal tube leaking) PT Visit Diagnosis: Muscle weakness (generalized) (M62.81);Other abnormalities of gait and mobility (R26.89)     Time: 2952-8413 PT Time Calculation (min) (ACUTE ONLY): 33 min  Charges:    $Therapeutic Activity: 23-37 mins PT General Charges $$ ACUTE PT VISIT: 1  Visit                     Aemilia Dedrick P., PTA Acute Rehabilitation Services Secure Chat Preferred 9a-5:30pm Office: [phone]    Mariel Shope [facility] 12/14/2023, 1:27 PM

## 2023-12-14 NOTE — Inpatient Diabetes Management (Signed)
 Inpatient Diabetes Program Recommendations  AACE/ADA: New Consensus Statement on Inpatient Glycemic Control   Target Ranges:  Prepandial:   less than 140 mg/dL      Peak postprandial:   less than 180 mg/dL (1-2 hours)      Critically ill patients:  140 - 180 mg/dL    Latest Reference Range & Units 12/13/23 04:29 12/13/23 09:05 12/13/23 11:15 12/13/23 18:10 12/13/23 19:52 12/13/23 20:48 12/14/23 00:14 12/14/23 04:21 12/14/23 08:06  Glucose-Capillary 70 - 99 mg/dL 76 94 409 (H) 811 (H) 914 (H) 268 (H) 195 (H) 155 (H) 66 (L)   Review of Glycemic Control  Diabetes history: DM2 Outpatient Diabetes medications: Lantus 24 units Q12H, Humalog 8 units TID with meals plus 2-15 units TID (correction) Current orders for Inpatient glycemic control: Semglee 25 units BID, Novolog 0-20 units Q4H  Inpatient Diabetes Program Recommendations:    Insulin: CBG down to 66 mg/dl at 7:82 am (likely due to Novolog resistant correction Q4H).  May want to consider decreasing Novolog correction to 0-15 units Q4H.  Thanks, Beacher Limerick, RN, MSN, CDCES Diabetes Coordinator Inpatient Diabetes Program [phone] (Team Pager from 8am to 5pm)

## 2023-12-14 NOTE — Progress Notes (Signed)
 Speech Language Pathology Treatment: Dysphagia  Patient Details Name: Tony Cantu MRN: [id number] DOB: 1951-08-03 Today's Date: 12/14/2023 Time: 8469-6295 SLP Time Calculation (min) (ACUTE ONLY): 16 min  Assessment / Plan / Recommendation Clinical Impression  Pt underwent EGD yesterday; his diet was advanced today from clears to regular solids. NG has been removed. Oral care was provided with pt assisting to brush his teeth. Recently delivered meal tray, still hot, was at bedside. He needed assist with set-up; fed himself peaches and several bites of pot roast with no further concerns for an oropharyngeal dysphagia. There was adequate mastication and no oral residue post-swallow. He drank 2/3 container of iced tea with no s/s of aspiration. No coughing during PO intake.  Intake was minimal and he declined further PO despite encouragement.   No concerns for airway protection or inefficiency with the oral/pharyngeal phases of the swallow. No further SLP f/u is needed. Our service will sign off.   HPI HPI: Tony Cantu is a 73 y.o. male who presented from SNF with altered mental status and nausea vomiting and dark stool. CT head and CXR 4/11 with no acute findings. EGD 4/16, significant for PHG, 1 gastric angioectasia s/p APC, GAVE s/p APC, and 2 duodenal angioectasias s/p APC- lesions likely source of bleed.  Pt with medical history significant of hypertension, hyperlipidemia, diabetes, paroxysmal A-fib, paroxysmal tachycardia, GERD, COPD/asthma, chronic diastolic CHF, NAFLD, cirrhosis, portal hypertension, obesity, OSA, BPH,      SLP Plan  All goals met      Recommendations for follow up therapy are one component of a multi-disciplinary discharge planning process, led by the attending physician.  Recommendations may be updated based on patient status, additional functional criteria and insurance authorization.    Recommendations  Diet recommendations: Regular;Thin liquid Liquids provided via:  Cup;Straw Medication Administration: Whole meds with liquid Supervision: Patient able to self feed Postural Changes and/or Swallow Maneuvers: Seated upright 90 degrees                  Oral care BID           All goals met    Tony Mongiello L. Beatris Lincoln, MA CCC/SLP Clinical Specialist - Acute Care SLP Acute Rehabilitation Services Office number [phone]  Tony Cantu  12/14/2023, 3:48 PM

## 2023-12-14 NOTE — Plan of Care (Signed)
  Problem: Education: Goal: Knowledge of General Education information will improve Description: Including pain rating scale, medication(s)/side effects and non-pharmacologic comfort measures Outcome: Progressing   Problem: Health Behavior/Discharge Planning: Goal: Ability to manage health-related needs will improve Outcome: Progressing   Problem: Clinical Measurements: Goal: Ability to maintain clinical measurements within normal limits will improve Outcome: Progressing Goal: Respiratory complications will improve Outcome: Progressing Goal: Cardiovascular complication will be avoided Outcome: Progressing   Problem: Activity: Goal: Risk for activity intolerance will decrease Outcome: Progressing   Problem: Nutrition: Goal: Adequate nutrition will be maintained Outcome: Progressing   Problem: Coping: Goal: Level of anxiety will decrease Outcome: Progressing   Problem: Pain Managment: Goal: General experience of comfort will improve and/or be controlled Outcome: Progressing

## 2023-12-14 NOTE — TOC Progression Note (Addendum)
 Transition of Care [facility]) - Progression Note    Patient Details  Name: Tony Cantu MRN: [id number] Date of Birth: May 23, 1951  [facility] of [facility]) CM/SW Contact  Jannice Mends, LCSW Phone Number: 12/14/2023, 11:01 AM  Clinical Narrative:    11am-CSW will request [facility] begin insurance authorization process once update therapy note is in.   2:32 PM-CSW requested [facility] begin insurance process.    Expected Discharge Plan: Skilled Nursing Facility Barriers to Discharge: Continued Medical Work up, English as a second language teacher  Expected Discharge Plan and Services In-house Referral: Clinical Social Work   Post Acute Care Choice: Skilled Nursing Facility Living arrangements for the past 2 months: Single Family Home, Skilled Nursing Facility                                       Social Determinants of Health (SDOH) Interventions SDOH Screenings   Food Insecurity: Patient Unable To Answer (12/09/2023)  Housing: Patient Unable To Answer (12/09/2023)  Recent Concern: Housing - High Risk (10/30/2023)  Transportation Needs: Patient Unable To Answer (12/09/2023)  Utilities: Patient Unable To Answer (12/09/2023)  Depression (PHQ2-9): Medium Risk (09/13/2023)  Financial Resource Strain: Low Risk  (06/09/2022)  Social Connections: Patient Unable To Answer (12/09/2023)  Recent Concern: Social Connections - Socially Isolated (10/30/2023)  Tobacco Use: Medium Risk (12/08/2023)    Readmission Risk Interventions     No data to display

## 2023-12-15 DIAGNOSIS — K922 Gastrointestinal hemorrhage, unspecified: Secondary | ICD-10-CM | POA: Diagnosis not present

## 2023-12-15 LAB — GLUCOSE, CAPILLARY
Glucose-Capillary: 225 mg/dL — ABNORMAL HIGH (ref 70–99)
Glucose-Capillary: 44 mg/dL — CL (ref 70–99)
Glucose-Capillary: 45 mg/dL — ABNORMAL LOW (ref 70–99)
Glucose-Capillary: 49 mg/dL — ABNORMAL LOW (ref 70–99)
Glucose-Capillary: 186 mg/dL — ABNORMAL HIGH (ref 70–99)
Glucose-Capillary: 188 mg/dL — ABNORMAL HIGH (ref 70–99)
Glucose-Capillary: 232 mg/dL — ABNORMAL HIGH (ref 70–99)

## 2023-12-15 LAB — BASIC METABOLIC PANEL WITH GFR
Anion gap: 9 (ref 5–15)
BUN: 6 mg/dL — ABNORMAL LOW (ref 8–23)
CO2: 25 mmol/L (ref 22–32)
Calcium: 9.4 mg/dL (ref 8.9–10.3)
Chloride: 106 mmol/L (ref 98–111)
Creatinine, Ser: 0.73 mg/dL (ref 0.61–1.24)
GFR, Estimated: >60 mL/min (ref >60)
Glucose, Bld: 99 mg/dL (ref 70–99)
Potassium: 3.6 mmol/L (ref 3.5–5.1)
Sodium: 140 mmol/L (ref 135–145)

## 2023-12-15 LAB — HEPATIC FUNCTION PANEL
ALT: 134 U/L — ABNORMAL HIGH (ref 0–44)
AST: 168 U/L — ABNORMAL HIGH (ref 15–41)
Albumin: 2.8 g/dL — ABNORMAL LOW (ref 3.5–5.0)
Alkaline Phosphatase: 151 U/L — ABNORMAL HIGH (ref 38–126)
Bilirubin, Direct: 0.3 mg/dL — ABNORMAL HIGH (ref 0.0–0.2)
Indirect Bilirubin: 0.9 mg/dL (ref 0.3–0.9)
Total Bilirubin: 1.2 mg/dL (ref 0.0–1.2)
Total Protein: 5.3 g/dL — ABNORMAL LOW (ref 6.5–8.1)

## 2023-12-15 LAB — AMMONIA: Ammonia: 20 umol/L (ref 9–35)

## 2023-12-15 MED ORDER — POTASSIUM CHLORIDE CRYS ER 20 MEQ PO TBCR
40.0000 meq | EXTENDED_RELEASE_TABLET | Freq: Once | ORAL | Status: AC
  Administered 2023-12-15: 40 meq via ORAL
  Filled 2023-12-15: qty 2

## 2023-12-15 MED ORDER — MIDODRINE HCL 5 MG PO TABS
5.0000 mg | ORAL_TABLET | Freq: Three times a day (TID) | ORAL | Status: DC
  Administered 2023-12-15 – 2023-12-18 (×12): 5 mg via ORAL
  Filled 2023-12-15 (×12): qty 1

## 2023-12-15 MED ORDER — INSULIN ASPART 100 UNIT/ML IJ SOLN
0.0000 [IU] | Freq: Three times a day (TID) | INTRAMUSCULAR | Status: DC
  Administered 2023-12-15: 2 [IU] via SUBCUTANEOUS
  Administered 2023-12-15: 3 [IU] via SUBCUTANEOUS
  Administered 2023-12-16: 9 [IU] via SUBCUTANEOUS
  Administered 2023-12-16: 2 [IU] via SUBCUTANEOUS
  Administered 2023-12-17: 3 [IU] via SUBCUTANEOUS
  Administered 2023-12-17 (×2): 2 [IU] via SUBCUTANEOUS
  Administered 2023-12-18: 1 [IU] via SUBCUTANEOUS
  Administered 2023-12-18 (×2): 3 [IU] via SUBCUTANEOUS
  Administered 2023-12-19 (×2): 2 [IU] via SUBCUTANEOUS
  Administered 2023-12-20: 3 [IU] via SUBCUTANEOUS
  Administered 2023-12-20: 7 [IU] via SUBCUTANEOUS
  Administered 2023-12-20: 2 [IU] via SUBCUTANEOUS
  Administered 2023-12-21: 7 [IU] via SUBCUTANEOUS
  Administered 2023-12-21: 5 [IU] via SUBCUTANEOUS
  Administered 2023-12-21: 3 [IU] via SUBCUTANEOUS
  Administered 2023-12-22: 9 [IU] via SUBCUTANEOUS
  Administered 2023-12-22: 7 [IU] via SUBCUTANEOUS
  Administered 2023-12-22 – 2023-12-23 (×2): 5 [IU] via SUBCUTANEOUS
  Administered 2023-12-23: 9 [IU] via SUBCUTANEOUS
  Administered 2023-12-23: 4 [IU] via SUBCUTANEOUS
  Administered 2023-12-24: 3 [IU] via SUBCUTANEOUS
  Administered 2023-12-24: 1 [IU] via SUBCUTANEOUS
  Administered 2023-12-25 (×3): 3 [IU] via SUBCUTANEOUS
  Administered 2023-12-26 (×2): 5 [IU] via SUBCUTANEOUS
  Administered 2023-12-27: 2 [IU] via SUBCUTANEOUS
  Administered 2023-12-27: 9 [IU] via SUBCUTANEOUS
  Administered 2023-12-27: 7 [IU] via SUBCUTANEOUS
  Administered 2023-12-28: 9 [IU] via SUBCUTANEOUS
  Administered 2023-12-28: 3 [IU] via SUBCUTANEOUS
  Administered 2023-12-28: 5 [IU] via SUBCUTANEOUS
  Administered 2023-12-29 (×2): 2 [IU] via SUBCUTANEOUS
  Administered 2023-12-29: 9 [IU] via SUBCUTANEOUS
  Administered 2023-12-30: 7 [IU] via SUBCUTANEOUS
  Administered 2023-12-30: 3 [IU] via SUBCUTANEOUS
  Administered 2023-12-31: 1 [IU] via SUBCUTANEOUS
  Administered 2023-12-31 – 2024-01-01 (×2): 5 [IU] via SUBCUTANEOUS
  Administered 2024-01-01: 1 [IU] via SUBCUTANEOUS
  Administered 2024-01-01: 5 [IU] via SUBCUTANEOUS
  Administered 2024-01-02 – 2024-01-03 (×4): 2 [IU] via SUBCUTANEOUS
  Administered 2024-01-03: 7 [IU] via SUBCUTANEOUS
  Administered 2024-01-04 (×2): 2 [IU] via SUBCUTANEOUS
  Administered 2024-01-05: 5 [IU] via SUBCUTANEOUS
  Administered 2024-01-05: 3 [IU] via SUBCUTANEOUS
  Administered 2024-01-05: 5 [IU] via SUBCUTANEOUS
  Administered 2024-01-06: 2 [IU] via SUBCUTANEOUS
  Administered 2024-01-06: 3 [IU] via SUBCUTANEOUS
  Administered 2024-01-06: 2 [IU] via SUBCUTANEOUS
  Administered 2024-01-07: 7 [IU] via SUBCUTANEOUS
  Administered 2024-01-07: 5 [IU] via SUBCUTANEOUS
  Administered 2024-01-08: 7 [IU] via SUBCUTANEOUS
  Administered 2024-01-08: 1 [IU] via SUBCUTANEOUS
  Administered 2024-01-08: 3 [IU] via SUBCUTANEOUS
  Administered 2024-01-09: 5 [IU] via SUBCUTANEOUS
  Administered 2024-01-09 (×2): 3 [IU] via SUBCUTANEOUS
  Administered 2024-01-10: 5 [IU] via SUBCUTANEOUS
  Administered 2024-01-10 (×2): 3 [IU] via SUBCUTANEOUS
  Administered 2024-01-11: 5 [IU] via SUBCUTANEOUS
  Administered 2024-01-11: 7 [IU] via SUBCUTANEOUS
  Administered 2024-01-11: 2 [IU] via SUBCUTANEOUS

## 2023-12-15 MED ORDER — INSULIN ASPART 100 UNIT/ML IJ SOLN
0.0000 [IU] | Freq: Every day | INTRAMUSCULAR | Status: DC
  Administered 2023-12-15: 2 [IU] via SUBCUTANEOUS
  Administered 2023-12-16: 3 [IU] via SUBCUTANEOUS
  Administered 2023-12-17: 2 [IU] via SUBCUTANEOUS
  Administered 2023-12-19: 3 [IU] via SUBCUTANEOUS
  Administered 2023-12-22: 4 [IU] via SUBCUTANEOUS
  Administered 2023-12-23: 2 [IU] via SUBCUTANEOUS
  Administered 2023-12-25: 4 [IU] via SUBCUTANEOUS
  Administered 2023-12-28: 3 [IU] via SUBCUTANEOUS
  Administered 2024-01-01 – 2024-01-03 (×2): 2 [IU] via SUBCUTANEOUS
  Administered 2024-01-04: 3 [IU] via SUBCUTANEOUS
  Administered 2024-01-05: 2 [IU] via SUBCUTANEOUS

## 2023-12-15 MED ORDER — INSULIN GLARGINE-YFGN 100 UNIT/ML ~~LOC~~ SOLN
15.0000 [IU] | Freq: Two times a day (BID) | SUBCUTANEOUS | Status: DC
  Administered 2023-12-15 – 2023-12-17 (×4): 15 [IU] via SUBCUTANEOUS
  Filled 2023-12-15 (×5): qty 0.15

## 2023-12-15 MED ORDER — DEXTROSE 50 % IV SOLN
1.0000 | Freq: Once | INTRAVENOUS | Status: DC
  Filled 2023-12-15: qty 50

## 2023-12-15 NOTE — Progress Notes (Signed)
 Occupational Therapy Treatment Patient Details Name: Tony Cantu MRN: [id number] DOB: Dec 08, 1950 Today's Date: 12/15/2023   History of present illness Patient is 73 yo male admitted on 12/08/23 for AMS, nausea, vomiting, and dark stool. PMHx significant for type 1 diabetes, A-fib on Eliquis , seizure disorder, aortic stenosis, HTN, peptic ulcer disease, OA with bilateral knee replacement, polyclonal gammopathy, OSA on CPAP, GERD, COPD/asthma, chronic diastolic CHF, cirrhosis, portal hypertension, and BPH.   OT comments  Pt. Seen for skilled OT treatment session.  Mobility limited secondary to symptomatic lower BP (95/58-68).  Pt. More alert this session, able to complete bed level grooming tasks with set up.  No cues required for sequencing or completion.  Awaiting larger size of TED hose stockings to be delivered to see if BPs will improve for increasing mobility and ADLS OOB next session.  Cont. With acute OT POC.        If plan is discharge home, recommend the following:  Two people to help with walking and/or transfers;A lot of help with bathing/dressing/bathroom;Assistance with cooking/housework;Direct supervision/assist for medications management;Direct supervision/assist for financial management;Assist for transportation;Help with stairs or ramp for entrance;Supervision due to cognitive status   Equipment Recommendations       Recommendations for Other Services      Precautions / Restrictions Precautions Precautions: Fall Recall of Precautions/Restrictions: Impaired Precaution/Restrictions Comments: watch BP; recent loose stools       Mobility Bed Mobility                    Transfers                         Balance                                           ADL either performed or assessed with clinical judgement   ADL Overall ADL's : Needs assistance/impaired     Grooming: Wash/dry face;Oral care;Bed level Grooming Details  (indicate cue type and reason): no cueing or sequencing help required                               General ADL Comments: session kept bed level; secondary to lower BPs and pt. symptomatic "light headed" but states "i always feel that way"    Extremity/Trunk Assessment              Vision       Perception     Praxis     Communication Communication Communication: No apparent difficulties   Cognition Arousal: Alert Behavior During Therapy: WFL for tasks assessed/performed Cognition: Cognition impaired, No family/caregiver present to determine baseline   Orientation impairments: Situation, Time Awareness: Intellectual awareness impaired, Online awareness impaired   Attention impairment (select first level of impairment): Sustained attention   OT - Cognition Comments: spoke about what type of work he used to do, where he lived in [place]  and now in [place] area                 Following commands: Impaired Following commands impaired: Only follows one step commands consistently, Follows one step commands with increased time      Cueing   Cueing Techniques: Verbal cues, Tactile cues, Gestural cues  Exercises      Shoulder Instructions  General Comments      Pertinent Vitals/ Pain       Pain Assessment Pain Assessment: No/denies pain  Home Living                                          Prior Functioning/Environment              Frequency  Min 1X/week        Progress Toward Goals  OT Goals(current goals can now be found in the care plan section)  Progress towards OT goals: Progressing toward goals     Plan      Co-evaluation                 AM-PAC OT "6 Clicks" Daily Activity     Outcome Measure   Help from another person eating meals?: A Little Help from another person taking care of personal grooming?: A Little Help from another person toileting, which includes using toliet,  bedpan, or urinal?: A Lot Help from another person bathing (including washing, rinsing, drying)?: A Lot Help from another person to put on and taking off regular upper body clothing?: A Lot Help from another person to put on and taking off regular lower body clothing?: A Lot 6 Click Score: 14    End of Session    OT Visit Diagnosis: Unsteadiness on feet (R26.81);Other abnormalities of gait and mobility (R26.89);Muscle weakness (generalized) (M62.81);Other symptoms and signs involving cognitive function   Activity Tolerance Other (comment) (session limited secondary to symptomatic lower BPs)   Patient Left in bed;with call bell/phone within reach;with bed alarm set   Nurse Communication Other (comment) (secure chat rn with BP and session details)        Time: 8119-1478 OT Time Calculation (min): 12 min  Charges: OT General Charges $OT Visit: 1 Visit OT Treatments $Self Care/Home Management : 8-22 mins  Howell Macintosh, COTA/L Acute Rehabilitation [phone]   Leory Rands [place]-COTA/L  12/15/2023, 12:41 PM

## 2023-12-15 NOTE — Anesthesia Postprocedure Evaluation (Signed)
 Anesthesia Post Note  Patient: Tony Cantu  Procedure(s) Performed: EGD (ESOPHAGOGASTRODUODENOSCOPY) EGD, WITH ARGON PLASMA COAGULATION     Patient location during evaluation: Endoscopy Anesthesia Type: General Level of consciousness: awake and alert Pain management: pain level controlled Vital Signs Assessment: post-procedure vital signs reviewed and stable Respiratory status: spontaneous breathing, nonlabored ventilation, respiratory function stable and patient connected to nasal cannula oxygen  Cardiovascular status: blood pressure returned to baseline and stable Postop Assessment: no apparent nausea or vomiting Anesthetic complications: no   No notable events documented.               Azalea Cedar

## 2023-12-15 NOTE — Plan of Care (Signed)
   Problem: Education: Goal: Knowledge of General Education information will improve Description: Including pain rating scale, medication(s)/side effects and non-pharmacologic comfort measures Outcome: Progressing   Problem: Health Behavior/Discharge Planning: Goal: Ability to manage health-related needs will improve Outcome: Progressing   Problem: Clinical Measurements: Goal: Ability to maintain clinical measurements within normal limits will improve Outcome: Progressing Goal: Will remain free from infection Outcome: Progressing Goal: Respiratory complications will improve Outcome: Progressing   Problem: Activity: Goal: Risk for activity intolerance will decrease Outcome: Progressing   Problem: Nutrition: Goal: Adequate nutrition will be maintained Outcome: Progressing

## 2023-12-15 NOTE — Progress Notes (Signed)
PROGRESS NOTE

## 2023-12-15 NOTE — TOC Progression Note (Addendum)
 Transition of Care [facility]) - Progression Note    Patient Details  Name: Tony Cantu MRN: [id number] Date of Birth: Nov 06, 1950  [facility] CM/SW Contact  Jannice Mends, LCSW Phone Number: 12/15/2023, 4:07 PM  Clinical Narrative:    CSW received call from [facility] SNF stating that per insurance, patient has used his 60 days of SNF already and will not be approved for more. CSW spoke with patient's ex-wife, [name], to inform her. She stated he cannot come home with her so will need to go to SNF private pay as he has the funds to do so. She has his checkbook and is working with a Clinical research associate to become financial POA. CSW will make inquires to SNFs about private pay costs. [facility] is $396/day semi and will need 30 days up front which cannot be arranged until Monday with the business office. [facility] is $366/day and can also be arranged Monday. No response from [facility] facilities yet.    Expected Discharge Plan: Skilled Nursing Facility Barriers to Discharge: Continued Medical Work up, English as a second language teacher  Expected Discharge Plan and Services In-house Referral: Clinical Social Work   Post Acute Care Choice: Skilled Nursing Facility Living arrangements for the past 2 months: Single Family Home, Skilled Nursing Facility                                       Social Determinants of Health (SDOH) Interventions SDOH Screenings   Food Insecurity: Patient Unable To Answer (12/09/2023)  Housing: Patient Unable To Answer (12/09/2023)  Recent Concern: Housing - High Risk (10/30/2023)  Transportation Needs: Patient Unable To Answer (12/09/2023)  Utilities: Patient Unable To Answer (12/09/2023)  Depression (PHQ2-9): Medium Risk (09/13/2023)  Financial Resource Strain: Low Risk  (06/09/2022)  Social Connections: Patient Unable To Answer (12/09/2023)  Recent Concern: Social Connections - Socially Isolated (10/30/2023)  Tobacco Use: Medium Risk (12/08/2023)    Readmission Risk Interventions      No data to display

## 2023-12-16 DIAGNOSIS — K922 Gastrointestinal hemorrhage, unspecified: Secondary | ICD-10-CM | POA: Diagnosis not present

## 2023-12-16 LAB — CBC
HCT: 29.1 % — ABNORMAL LOW (ref 39.0–52.0)
Hemoglobin: 9.4 g/dL — ABNORMAL LOW (ref 13.0–17.0)
MCH: 35.1 pg — ABNORMAL HIGH (ref 26.0–34.0)
MCHC: 32.3 g/dL (ref 30.0–36.0)
MCV: 108.6 fL — ABNORMAL HIGH (ref 80.0–100.0)
Platelets: 69 10*3/uL — ABNORMAL LOW (ref 150–400)
RBC: 2.68 MIL/uL — ABNORMAL LOW (ref 4.22–5.81)
RDW: 16.9 % — ABNORMAL HIGH (ref 11.5–15.5)
WBC: 4.8 10*3/uL (ref 4.0–10.5)
nRBC: 0 % (ref 0.0–0.2)

## 2023-12-16 LAB — COMPREHENSIVE METABOLIC PANEL WITH GFR
ALT: 92 U/L — ABNORMAL HIGH (ref 0–44)
AST: 83 U/L — ABNORMAL HIGH (ref 15–41)
Albumin: 3.4 g/dL — ABNORMAL LOW (ref 3.5–5.0)
Alkaline Phosphatase: 132 U/L — ABNORMAL HIGH (ref 38–126)
Anion gap: 6 (ref 5–15)
BUN: <5 mg/dL — ABNORMAL LOW (ref 8–23)
CO2: 25 mmol/L (ref 22–32)
Calcium: 9.6 mg/dL (ref 8.9–10.3)
Chloride: 109 mmol/L (ref 98–111)
Creatinine, Ser: 0.68 mg/dL (ref 0.61–1.24)
GFR, Estimated: >60 mL/min (ref >60)
Glucose, Bld: 169 mg/dL — ABNORMAL HIGH (ref 70–99)
Potassium: 4.1 mmol/L (ref 3.5–5.1)
Sodium: 140 mmol/L (ref 135–145)
Total Bilirubin: 1.4 mg/dL — ABNORMAL HIGH (ref 0.0–1.2)
Total Protein: 5.6 g/dL — ABNORMAL LOW (ref 6.5–8.1)

## 2023-12-16 LAB — GLUCOSE, CAPILLARY
Glucose-Capillary: 143 mg/dL — ABNORMAL HIGH (ref 70–99)
Glucose-Capillary: 162 mg/dL — ABNORMAL HIGH (ref 70–99)
Glucose-Capillary: 388 mg/dL — ABNORMAL HIGH (ref 70–99)
Glucose-Capillary: 292 mg/dL — ABNORMAL HIGH (ref 70–99)

## 2023-12-16 LAB — H. PYLORI ANTIGEN, STOOL: H. Pylori Stool Ag, Eia: NEGATIVE

## 2023-12-16 MED ORDER — MELATONIN 5 MG PO TABS
5.0000 mg | ORAL_TABLET | Freq: Once | ORAL | Status: AC
  Administered 2023-12-16: 5 mg via ORAL
  Filled 2023-12-16: qty 1

## 2023-12-16 NOTE — Plan of Care (Signed)

## 2023-12-16 NOTE — Progress Notes (Signed)
 PROGRESS NOTE                                                                                                                                                                                                             Patient Demographics:    Tony Cantu, is a 73 y.o. male, DOB - 1951-01-18, ZOX:[id number]  Outpatient Primary MD for the patient is [name], DO    LOS - 8  Admit date - 12/08/2023    Chief Complaint  Patient presents with   Fatigue       Brief Narrative (HPI from H&P)     73 y.o. male with medical history significant of hypertension, hyperlipidemia, diabetes, paroxysmal A-fib, paroxysmal tachycardia, GERD, COPD/asthma, chronic diastolic CHF, NAFLD, cirrhosis, portal hypertension, obesity, OSA, BPH, presenting with altered mental status and nausea vomiting and dark stool.  He is with increased fatigue, and somnolence, workup in ED significant for severe hepatic encephalopathy, anemia, GI consulted, patient went for endoscopy 4/16, significant for PHG, 1 gastric angioectasia s/p APC, GAVE s/p APC, and 2 duodenal angioectasias s/p APC.      Subjective:   No significant events overnight, he denies any complaints today.   Assessment  & Plan :   NASH cirrhosis with portal hypertension Melena, coffee-ground emesis on admission History of GAVE Upper GI bleed due to gastric angiectasia, GAVE, duodenal angiectasia. - History of cirrhosis with portal hypertension/varices, with melena as the presenting symptom. -GI Consult greatly appreciated, went for EGD/16, showed PHG, 1 gastric angioectasia s/p APC, GAVE s/p APC, and 2 duodenal angioectasias s/p APC.  These lesions could be causing his acute GI bleed, . - May resume Eliquis  today as discussed with GI , when remained stable . - Continue with PPI twice daily.   - Continue to monitor CBC closely and transfuse as needed, hemoglobin remained stable.    Hyperammonemia Acute hepatic encephalopathy NASH cirrhosis Thrombocytopenia -  improving with high-dose lactulose , Xifaxan , Monia level is trending down, mentation continues to improve, no focal deficits, head CT stable. -Platelet count keep trending down, at 69K today, I will hold his Eliquis  for now.   Hypertension   - Not on any antihypertensives, actually his blood pressure is soft   Hyperlipidemia  - Discontinue his statin due to elevated LFTs.   Paroxysmal atrial fibrillation   - Resume  amiodarone , with underlying history of [name] will defer it to his primary cardiologist on long-term use of amiodarone . Eliquis  held/reversed as above.  As needed IV Cardizem  - Platelet count down to 69 today, I will hold Eliquis  for now.  Transaminitis -LFTs are elevated this morning, no clear etiology, but overall his blood pressure has been on the lower side, will avoid low blood pressure, discontinue statin, avoid Tylenol , r LFTs are trending down  Hypotension -patient blood pressure is soft, symptomatic with dizziness lightheadedness upon standing with PT today, avoid hypotension ,  started on midodrine , will uptitrate dose today, continue with IV albumin  - Will start on TED hose as well.    GERD  - On IV PPI   COPD/asthma  - Resume Singulair  and tolerating p.o.  Not on inhalers, no acute issues   Chronic diastolic CHF  > Last echocardiogram, Was TEE in January 2025 which showed EF 60-65%, normal RV function.  Not on diuretic,  Continue to monitor   QTc prolongation > With underlying right bundle branch block, hard to discern correct QTc.  replace electrolytes and monitor on telemetry.   Hypernatremia.  Failed to keep up with oral hydration, gentle D5W on 12/12/2023, insulin  adjusted as he is expected to run high sugars.  Monitor.  Hypokalemia Hypomagnesemia - Repleted  Diabetes  -have lowered his Lantus  to 15 units given recurrent hypoglycemia, continue with insulin  sliding scale.      Lab Results  Component Value Date   HGBA1C 4.4 (L) 09/19/2023   CBG (last 3)  Recent Labs    12/15/23 2057 12/16/23 0809 12/16/23 1155  GLUCAP 232* 143* 162*         Condition - Extremely Guarded  Family Communication  : none at bedside  Code Status : DNR  Consults  :  GI  PUD Prophylaxis : PPI   Procedures  :     CT head nonacute.   CT   abdomen pelvis.   1. Mild circumferential thickening of the rectum with mild to moderate perirectal fat stranding and trace presacral fluid, favoring proctitis. 2. Otherwise, no acute inflammatory process identified within the abdomen or pelvis. 3. Cirrhotic liver with multiple venous collaterals, suggesting sequela of portal hypertension. No splenomegaly or ascites. 4. Multiple other nonacute observations, as described above. Aortic Atherosclerosis (ICD10-I70.0).      Disposition Plan  :    Status is: Inpatient  DVT Prophylaxis  :    Place TED hose Start: 12/14/23 1312 Place and maintain sequential compression device Start: 12/09/23 0555 SCDs Start: 12/08/23 1707 apixaban  (ELIQUIS ) tablet 5 mg    Lab Results  Component Value Date   PLT 69 (L) 12/16/2023    Diet :  Diet Order             Diet heart healthy/carb modified Room service appropriate? Yes; Fluid consistency: Thin  Diet effective now                    Inpatient Medications  Scheduled Meds:  amiodarone   200 mg Oral Daily   apixaban   5 mg Oral BID   dextrose   1 ampule Intravenous Once   Gerhardt's butt cream   Topical TID   insulin  aspart  0-5 Units Subcutaneous QHS   insulin  aspart  0-9 Units Subcutaneous TID WC   insulin  glargine-yfgn  15 Units Subcutaneous BID   lactulose   30 g Oral TID   midodrine   5 mg Oral TID WC   pantoprazole  (PROTONIX ) IV  40 mg Intravenous Q12H   rifaximin   550 mg Oral BID   sodium chloride  flush  3 mL Intravenous Q12H   Continuous Infusions:   PRN Meds:.acetaminophen  **OR** acetaminophen ,  diltiazem   Antibiotics  :    Anti-infectives (From admission, onward)    Start     Dose/Rate Route Frequency Ordered Stop   12/10/23 2200  rifaximin  (XIFAXAN ) tablet 550 mg        550 mg Oral 2 times daily 12/10/23 1815     12/09/23 1000  rifaximin  (XIFAXAN ) tablet 550 mg  Status:  Discontinued        550 mg Per Tube 2 times daily 12/09/23 0827 12/10/23 1815         Objective:   Vitals:   12/15/23 2324 12/16/23 0320 12/16/23 0810 12/16/23 1200  BP: 91/62 (!) 99/59 100/61 117/62  Pulse: (!) 55 (!) 57 66 63  Resp: 20 19 18 16   Temp: 97.7 F (36.5 C) 97.6 F (36.4 C) 97.8 F (36.6 C) 98 F (36.7 C)  TempSrc: Oral Oral Oral Oral  SpO2: 96% 98% 100% 98%  Weight:      Height:        Wt Readings from Last 3 Encounters:  12/09/23 75.1 kg  11/14/23 98.4 kg  11/03/23 85.2 kg     Intake/Output Summary (Last 24 hours) at 12/16/2023 1530 Last data filed at 12/16/2023 0321 Gross per 24 hour  Intake 243 ml  Output 1500 ml  Net -1257 ml     Physical Exam  Awake Alert, Oriented X 3, frail, deconditioned Symmetrical Chest Somers movement, Good air movement bilaterally, CTAB RRR,No Gallops,Rubs or new Murmurs, No Parasternal Heave +ve B.Sounds, Abd Soft, No tenderness, No rebound - guarding or rigidity. No Cyanosis, Clubbing or edema, No new Rash or bruise         Data Review:    Recent Labs  Lab 12/10/23 0813 12/11/23 0331 12/12/23 0827 12/13/23 0339 12/14/23 0352 12/16/23 0828  WBC 6.4 8.9 7.2 7.2 8.0 4.8  HGB 10.4* 11.1* 11.3* 10.6* 11.9* 9.4*  HCT 31.5* 34.4* 35.6* 32.8* 36.1* 29.1*  PLT 155 172 154 123* 118* 69*  MCV 106.1* 109.2* 109.9* 109.7* 106.8* 108.6*  MCH 35.0* 35.2* 34.9* 35.5* 35.2* 35.1*  MCHC 33.0 32.3 31.7 32.3 33.0 32.3  RDW 17.7* 17.9* 18.0* 17.4* 17.1* 16.9*  LYMPHSABS 1.2 1.3 1.4 1.7 0.7  --   MONOABS 0.7 1.1* 0.7 0.7 0.8  --   EOSABS 0.1 0.0 0.2 0.2 0.1  --   BASOSABS 0.1 0.0 0.1 0.0 0.0  --     Recent Labs  Lab 12/10/23 0813  12/11/23 0331 12/12/23 0343 12/13/23 0339 12/14/23 0352 12/15/23 0459 12/16/23 0527  NA 145 149* 151* 140 141 140 140  K 2.9* 3.9 3.4* 3.3* 3.7 3.6 4.1  CL 114* 119* 121* 113* 106 106 109  CO2 22 22 23 23 24 25 25   ANIONGAP 9 8 7  4* 11 9 6   GLUCOSE 293* 327* 87 102* 154* 99 169*  BUN 6* 7* 5* <5* 5* 6* <5*  CREATININE 0.86 1.10 0.97 0.82 0.85 0.73 0.68  AST 46* 50* 36 40 426* 168* 83*  ALT 41 47* 43 38 200* 134* 92*  ALKPHOS 80 92 94 84 182* 151* 132*  BILITOT 0.9 1.1 1.1 1.1 1.1 1.2 1.4*  ALBUMIN  2.2* 2.3* 2.3* 2.0* 2.2* 2.8* 3.4*  INR 1.3*  --   --   --   --   --   --  AMMONIA 61* 55* 53* 52* 24 20  --   BNP 423.0* 351.3* 388.1* 334.3*  --   --   --   MG 1.8 2.0 1.9 1.5* 1.7  --   --   CALCIUM  9.1 9.7 9.9 9.1 9.3 9.4 9.6      Recent Labs  Lab 12/10/23 0813 12/11/23 0331 12/12/23 0343 12/13/23 0339 12/14/23 0352 12/15/23 0459 12/16/23 0527  INR 1.3*  --   --   --   --   --   --   AMMONIA 61* 55* 53* 52* 24 20  --   BNP 423.0* 351.3* 388.1* 334.3*  --   --   --   MG 1.8 2.0 1.9 1.5* 1.7  --   --   CALCIUM  9.1 9.7 9.9 9.1 9.3 9.4 9.6    --------------------------------------------------------------------------------------------------------------- Lab Results  Component Value Date   CHOL 126 01/04/2022   HDL 57 01/04/2022   LDLCALC 55 01/04/2022   TRIG 67 01/04/2022   CHOLHDL 2.2 01/04/2022    Lab Results  Component Value Date   HGBA1C 4.4 (L) 09/19/2023   No results for input(s): "TSH", "T4TOTAL", "FREET4", "T3FREE", "THYROIDAB" in the last 72 hours.    Radiology Report No results found.    Signature  -   Seena Dadds M.D on 12/16/2023 at 3:30 PM   -  To page go to [email or web address]

## 2023-12-17 DIAGNOSIS — K922 Gastrointestinal hemorrhage, unspecified: Secondary | ICD-10-CM | POA: Diagnosis not present

## 2023-12-17 LAB — GLUCOSE, CAPILLARY
Glucose-Capillary: 177 mg/dL — ABNORMAL HIGH (ref 70–99)
Glucose-Capillary: 188 mg/dL — ABNORMAL HIGH (ref 70–99)
Glucose-Capillary: 264 mg/dL — ABNORMAL HIGH (ref 70–99)
Glucose-Capillary: 232 mg/dL — ABNORMAL HIGH (ref 70–99)

## 2023-12-17 LAB — COMPREHENSIVE METABOLIC PANEL WITH GFR
ALT: 78 U/L — ABNORMAL HIGH (ref 0–44)
AST: 70 U/L — ABNORMAL HIGH (ref 15–41)
Albumin: 2.9 g/dL — ABNORMAL LOW (ref 3.5–5.0)
Alkaline Phosphatase: 153 U/L — ABNORMAL HIGH (ref 38–126)
Anion gap: 8 (ref 5–15)
BUN: <5 mg/dL — ABNORMAL LOW (ref 8–23)
CO2: 22 mmol/L (ref 22–32)
Calcium: 9.3 mg/dL (ref 8.9–10.3)
Chloride: 105 mmol/L (ref 98–111)
Creatinine, Ser: 0.63 mg/dL (ref 0.61–1.24)
GFR, Estimated: >60 mL/min
Glucose, Bld: 203 mg/dL — ABNORMAL HIGH (ref 70–99)
Potassium: 3.8 mmol/L (ref 3.5–5.1)
Sodium: 135 mmol/L (ref 135–145)
Total Bilirubin: 1 mg/dL (ref 0.0–1.2)
Total Protein: 5.4 g/dL — ABNORMAL LOW (ref 6.5–8.1)

## 2023-12-17 LAB — CBC
HCT: 31.7 % — ABNORMAL LOW (ref 39.0–52.0)
Hemoglobin: 10.4 g/dL — ABNORMAL LOW (ref 13.0–17.0)
MCH: 35.3 pg — ABNORMAL HIGH (ref 26.0–34.0)
MCHC: 32.8 g/dL (ref 30.0–36.0)
MCV: 107.5 fL — ABNORMAL HIGH (ref 80.0–100.0)
Platelets: 73 10*3/uL — ABNORMAL LOW (ref 150–400)
RBC: 2.95 MIL/uL — ABNORMAL LOW (ref 4.22–5.81)
RDW: 16.6 % — ABNORMAL HIGH (ref 11.5–15.5)
WBC: 4.9 10*3/uL (ref 4.0–10.5)
nRBC: 0 % (ref 0.0–0.2)

## 2023-12-17 LAB — TECHNOLOGIST SMEAR REVIEW: Plt Morphology: NORMAL

## 2023-12-17 MED ORDER — LACTULOSE 10 GM/15ML PO SOLN
30.0000 g | Freq: Once | ORAL | Status: AC
  Administered 2023-12-17: 30 g via ORAL
  Filled 2023-12-17: qty 60

## 2023-12-17 MED ORDER — MELATONIN 3 MG PO TABS
3.0000 mg | ORAL_TABLET | Freq: Every evening | ORAL | Status: DC | PRN
  Administered 2023-12-17 – 2023-12-30 (×3): 3 mg via ORAL
  Filled 2023-12-17 (×3): qty 1

## 2023-12-17 MED ORDER — INSULIN GLARGINE-YFGN 100 UNIT/ML ~~LOC~~ SOLN
18.0000 [IU] | Freq: Two times a day (BID) | SUBCUTANEOUS | Status: DC
  Administered 2023-12-17 – 2023-12-18 (×3): 18 [IU] via SUBCUTANEOUS
  Filled 2023-12-17 (×5): qty 0.18

## 2023-12-17 MED ORDER — AMIODARONE HCL 200 MG PO TABS
100.0000 mg | ORAL_TABLET | Freq: Every day | ORAL | Status: DC
  Administered 2023-12-18 – 2024-01-11 (×25): 100 mg via ORAL
  Filled 2023-12-17 (×25): qty 1

## 2023-12-17 MED ORDER — PANTOPRAZOLE SODIUM 40 MG PO TBEC
40.0000 mg | DELAYED_RELEASE_TABLET | Freq: Two times a day (BID) | ORAL | Status: DC
  Administered 2023-12-17 – 2024-01-11 (×51): 40 mg via ORAL
  Filled 2023-12-17 (×52): qty 1

## 2023-12-17 NOTE — Progress Notes (Signed)
 PROGRESS NOTE                                                                                                                                                                                                             Patient Demographics:    Tony Cantu, is a 73 y.o. male, DOB - [birth year], DUK:[id number]  Outpatient Primary MD for the patient is [name], DO    LOS - 9  Admit date - 12/08/2023    Chief Complaint  Patient presents with   Fatigue       Brief Narrative (HPI from H&P)      73 y.o. male with medical history significant of hypertension, hyperlipidemia, diabetes, paroxysmal A-fib, paroxysmal tachycardia, GERD, COPD/asthma, chronic diastolic CHF, NAFLD, cirrhosis, portal hypertension, obesity, OSA, BPH, presenting with altered mental status and nausea vomiting and dark stool.  He is with increased fatigue, and somnolence, workup in ED significant for severe hepatic encephalopathy, anemia, GI consulted, patient went for endoscopy 4/16, significant for PHG, 1 gastric angioectasia s/p APC, GAVE s/p APC, and 2 duodenal angioectasias s/p APC.      Subjective:   No significant events overnight, he denies any complaints today   Assessment  & Plan :   NASH cirrhosis with portal hypertension Melena, coffee-ground emesis on admission History of GAVE Upper GI bleed due to gastric angiectasia, GAVE, duodenal angiectasia. - History of cirrhosis with portal hypertension/varices, with melena as the presenting symptom. -GI Consult greatly appreciated, went for EGD/16, showed PHG, 1 gastric angioectasia s/p APC, GAVE s/p APC, and 2 duodenal angioectasias s/p APC.  These lesions could be causing his acute GI bleed, . - May resume Eliquis  today as discussed with GI , when remained stable . - Continue with PPI twice daily.   - Continue to monitor CBC closely and transfuse as needed, hemoglobin remained stable.    Hyperammonemia Acute hepatic encephalopathy NASH cirrhosis Thrombocytopenia -  improving with high-dose lactulose , Xifaxan , Monia level is trending down, mentation continues to improve, no focal deficits, head CT stable. -Platelet count keep trending down, but overall they have been with great variation by reviewing this in the past, possible clumping, will check smear review, meanwhile keep holding Eliquis .   Hypertension   - Not on any antihypertensives, actually his blood pressure is soft   Hyperlipidemia  - Discontinue  his statin due to elevated LFTs.   Paroxysmal atrial fibrillation   - Resume amiodarone , with underlying history of [name] will defer it to his primary cardiologist on long-term use of amiodarone . Eliquis  held/reversed as above.  As needed IV Cardizem  - Platelet count is low today, will keep holding Eliquis . - His heart rate on the lower side, so I will decrease amiodarone  further to 100 milligram oral daily.  Transaminitis -LFTs are elevated this morning, no clear etiology, but overall his blood pressure has been on the lower side, will avoid low blood pressure, discontinue statin, avoid Tylenol , r LFTs are trending down  Hypotension -patient blood pressure is soft, symptomatic with dizziness lightheadedness upon standing with PT today, avoid hypotension ,  started on midodrine , will uptitrate dose today, continue with IV albumin  - Will start on TED hose as well.    GERD  - On IV PPI   COPD/asthma  - Resume Singulair  and tolerating p.o.  Not on inhalers, no acute issues   Chronic diastolic CHF  > Last echocardiogram, Was TEE in January 2025 which showed EF 60-65%, normal RV function.  Not on diuretic,  Continue to monitor   QTc prolongation > With underlying right bundle branch block, hard to discern correct QTc.  replace electrolytes and monitor on telemetry.   Hypernatremia.  - resolved with D5W  Hypokalemia Hypomagnesemia - Repleted  Diabetes    -labile, but overall significantly high reading, no hypoglycemia, so I will help Lantus  to 18 units twice daily.     Lab Results  Component Value Date   HGBA1C 4.4 (L) 09/19/2023   CBG (last 3)  Recent Labs    12/16/23 2023 12/17/23 0807 12/17/23 1128  GLUCAP 292* 177* 188*         Condition - Extremely Guarded  Family Communication  : none at bedside this morning, tried to reach family upon the request by phone, no answer.  Code Status : DNR  Consults  :  GI  PUD Prophylaxis : PPI   Procedures  :     CT head nonacute.   CT   abdomen pelvis.   1. Mild circumferential thickening of the rectum with mild to moderate perirectal fat stranding and trace presacral fluid, favoring proctitis. 2. Otherwise, no acute inflammatory process identified within the abdomen or pelvis. 3. Cirrhotic liver with multiple venous collaterals, suggesting sequela of portal hypertension. No splenomegaly or ascites. 4. Multiple other nonacute observations, as described above. Aortic Atherosclerosis (ICD10-I70.0).      Disposition Plan  :    Status is: Inpatient  DVT Prophylaxis  :    Place TED hose Start: 12/14/23 1312 Place and maintain sequential compression device Start: 12/09/23 0555 SCDs Start: 12/08/23 1707    Lab Results  Component Value Date   PLT 73 (L) 12/17/2023    Diet :  Diet Order             Diet heart healthy/carb modified Room service appropriate? Yes; Fluid consistency: Thin  Diet effective now                    Inpatient Medications  Scheduled Meds:  amiodarone   200 mg Oral Daily   dextrose   1 ampule Intravenous Once   Gerhardt's butt cream   Topical TID   insulin  aspart  0-5 Units Subcutaneous QHS   insulin  aspart  0-9 Units Subcutaneous TID WC   insulin  glargine-yfgn  15 Units Subcutaneous BID   lactulose   30 g  Oral TID   midodrine   5 mg Oral TID WC   pantoprazole  (PROTONIX ) IV  40 mg Intravenous Q12H   rifaximin   550 mg Oral BID   sodium  chloride flush  3 mL Intravenous Q12H   Continuous Infusions:   PRN Meds:.acetaminophen  **OR** acetaminophen , diltiazem   Antibiotics  :    Anti-infectives (From admission, onward)    Start     Dose/Rate Route Frequency Ordered Stop   12/10/23 2200  rifaximin  (XIFAXAN ) tablet 550 mg        550 mg Oral 2 times daily 12/10/23 1815     12/09/23 1000  rifaximin  (XIFAXAN ) tablet 550 mg  Status:  Discontinued        550 mg Per Tube 2 times daily 12/09/23 0827 12/10/23 1815         Objective:   Vitals:   12/17/23 0010 12/17/23 0316 12/17/23 0800 12/17/23 1200  BP: 98/64 94/63 100/64 (!) 113/57  Pulse: 62 68 60 (!) 58  Resp: (!) 22 19 19 16   Temp: 98.6 F (37 C) 98 F (36.7 C) 98.2 F (36.8 C) 98.4 F (36.9 C)  TempSrc: Oral Oral Oral Oral  SpO2: 97% 97% 97% 97%  Weight:      Height:        Wt Readings from Last 3 Encounters:  12/09/23 75.1 kg  11/14/23 98.4 kg  11/03/23 85.2 kg     Intake/Output Summary (Last 24 hours) at 12/17/2023 1421 Last data filed at 12/17/2023 0800 Gross per 24 hour  Intake 3 ml  Output 2300 ml  Net -2297 ml     Physical Exam  Awake Alert, Oriented X 3, frail, deconditioned Symmetrical Chest [place]uesta movement, Good air movement bilaterally, CTAB RRR,No Gallops,Rubs or new Murmurs, No Parasternal Heave +ve B.Sounds, Abd Soft, No tenderness, No rebound - guarding or rigidity. No Cyanosis, Clubbing or edema, No new Rash or bruise          Data Review:    Recent Labs  Lab 12/11/23 0331 12/12/23 0827 12/13/23 0339 12/14/23 0352 12/16/23 0828 12/17/23 0628  WBC 8.9 7.2 7.2 8.0 4.8 4.9  HGB 11.1* 11.3* 10.6* 11.9* 9.4* 10.4*  HCT 34.4* 35.6* 32.8* 36.1* 29.1* 31.7*  PLT 172 154 123* 118* 69* 73*  MCV 109.2* 109.9* 109.7* 106.8* 108.6* 107.5*  MCH 35.2* 34.9* 35.5* 35.2* 35.1* 35.3*  MCHC 32.3 31.7 32.3 33.0 32.3 32.8  RDW 17.9* 18.0* 17.4* 17.1* 16.9* 16.6*  LYMPHSABS 1.3 1.4 1.7 0.7  --   --   MONOABS 1.1* 0.7 0.7 0.8  --   --    EOSABS 0.0 0.2 0.2 0.1  --   --   BASOSABS 0.0 0.1 0.0 0.0  --   --     Recent Labs  Lab 12/11/23 0331 12/12/23 0343 12/13/23 0339 12/14/23 0352 12/15/23 0459 12/16/23 0527 12/17/23 0628  NA 149* 151* 140 141 140 140 135  K 3.9 3.4* 3.3* 3.7 3.6 4.1 3.8  CL 119* 121* 113* 106 106 109 105  CO2 22 23 23 24 25 25 22   ANIONGAP 8 7 4* 11 9 6 8   GLUCOSE 327* 87 102* 154* 99 169* 203*  BUN 7* 5* <5* 5* 6* <5* <5*  CREATININE 1.10 0.97 0.82 0.85 0.73 0.68 0.63  AST 50* 36 40 426* 168* 83* 70*  ALT 47* 43 38 200* 134* 92* 78*  ALKPHOS 92 94 84 182* 151* 132* 153*  BILITOT 1.1 1.1 1.1 1.1 1.2 1.4* 1.0  ALBUMIN  2.3* 2.3* 2.0* 2.2* 2.8* 3.4* 2.9*  AMMONIA 55* 53* 52* 24 20  --   --   BNP 351.3* 388.1* 334.3*  --   --   --   --   MG 2.0 1.9 1.5* 1.7  --   --   --   CALCIUM  9.7 9.9 9.1 9.3 9.4 9.6 9.3      Recent Labs  Lab 12/11/23 0331 12/12/23 0343 12/13/23 0339 12/14/23 0352 12/15/23 0459 12/16/23 0527 12/17/23 0628  AMMONIA 55* 53* 52* 24 20  --   --   BNP 351.3* 388.1* 334.3*  --   --   --   --   MG 2.0 1.9 1.5* 1.7  --   --   --   CALCIUM  9.7 9.9 9.1 9.3 9.4 9.6 9.3    --------------------------------------------------------------------------------------------------------------- Lab Results  Component Value Date   CHOL 126 01/04/2022   HDL 57 01/04/2022   LDLCALC 55 01/04/2022   TRIG 67 01/04/2022   CHOLHDL 2.2 01/04/2022    Lab Results  Component Value Date   HGBA1C 4.4 (L) 09/19/2023   No results for input(s): "TSH", "T4TOTAL", "FREET4", "T3FREE", "THYROIDAB" in the last 72 hours.    Radiology Report No results found.    Signature  -   Seena Dadds M.D on 12/17/2023 at 2:21 PM   -  To page go to [email or web address]

## 2023-12-17 NOTE — Plan of Care (Signed)
  Problem: Education: Goal: Knowledge of General Education information will improve Description: Including pain rating scale, medication(s)/side effects and non-pharmacologic comfort measures Outcome: Progressing   Problem: Clinical Measurements: Goal: Ability to maintain clinical measurements within normal limits will improve Outcome: Progressing Goal: Will remain free from infection Outcome: Progressing Goal: Diagnostic test results will improve Outcome: Progressing Goal: Respiratory complications will improve Outcome: Progressing Goal: Cardiovascular complication will be avoided Outcome: Progressing   Problem: Elimination: Goal: Will not experience complications related to bowel motility Outcome: Progressing Goal: Will not experience complications related to urinary retention Outcome: Progressing   Problem: Pain Managment: Goal: General experience of comfort will improve and/or be controlled Outcome: Progressing   Problem: Coping: Goal: Ability to adjust to condition or change in health will improve Outcome: Progressing

## 2023-12-17 NOTE — Plan of Care (Signed)

## 2023-12-18 DIAGNOSIS — K922 Gastrointestinal hemorrhage, unspecified: Secondary | ICD-10-CM | POA: Diagnosis not present

## 2023-12-18 LAB — CBC
HCT: 31.5 % — ABNORMAL LOW (ref 39.0–52.0)
Hemoglobin: 10.5 g/dL — ABNORMAL LOW (ref 13.0–17.0)
MCH: 35.5 pg — ABNORMAL HIGH (ref 26.0–34.0)
MCHC: 33.3 g/dL (ref 30.0–36.0)
MCV: 106.4 fL — ABNORMAL HIGH (ref 80.0–100.0)
Platelets: 80 10*3/uL — ABNORMAL LOW (ref 150–400)
RBC: 2.96 MIL/uL — ABNORMAL LOW (ref 4.22–5.81)
RDW: 16.1 % — ABNORMAL HIGH (ref 11.5–15.5)
WBC: 4.5 10*3/uL (ref 4.0–10.5)
nRBC: 0 % (ref 0.0–0.2)

## 2023-12-18 LAB — GLUCOSE, CAPILLARY
Glucose-Capillary: 248 mg/dL — ABNORMAL HIGH (ref 70–99)
Glucose-Capillary: 209 mg/dL — ABNORMAL HIGH (ref 70–99)
Glucose-Capillary: 142 mg/dL — ABNORMAL HIGH (ref 70–99)
Glucose-Capillary: 101 mg/dL — ABNORMAL HIGH (ref 70–99)
Glucose-Capillary: 93 mg/dL (ref 70–99)

## 2023-12-18 LAB — AMMONIA: Ammonia: 44 umol/L — ABNORMAL HIGH (ref 9–35)

## 2023-12-18 MED ORDER — APIXABAN 5 MG PO TABS
5.0000 mg | ORAL_TABLET | Freq: Two times a day (BID) | ORAL | Status: DC
  Administered 2023-12-18 – 2024-01-11 (×48): 5 mg via ORAL
  Filled 2023-12-18 (×48): qty 1

## 2023-12-18 MED ORDER — SODIUM CHLORIDE 0.9 % IV BOLUS
250.0000 mL | Freq: Once | INTRAVENOUS | Status: AC
  Administered 2023-12-18: 250 mL via INTRAVENOUS

## 2023-12-18 MED ORDER — LACTULOSE 10 GM/15ML PO SOLN
30.0000 g | Freq: Once | ORAL | Status: AC
  Administered 2023-12-18: 30 g via ORAL
  Filled 2023-12-18: qty 60

## 2023-12-18 NOTE — Plan of Care (Signed)
  Problem: Education: Goal: Knowledge of General Education information will improve Description: Including pain rating scale, medication(s)/side effects and non-pharmacologic comfort measures Outcome: Progressing   Problem: Clinical Measurements: Goal: Ability to maintain clinical measurements within normal limits will improve Outcome: Progressing Goal: Diagnostic test results will improve Outcome: Progressing Goal: Respiratory complications will improve Outcome: Progressing Goal: Cardiovascular complication will be avoided Outcome: Progressing   Problem: Activity: Goal: Risk for activity intolerance will decrease Outcome: Progressing   Problem: Nutrition: Goal: Adequate nutrition will be maintained Outcome: Progressing   Problem: Coping: Goal: Level of anxiety will decrease Outcome: Progressing   Problem: Elimination: Goal: Will not experience complications related to urinary retention Outcome: Progressing   Problem: Pain Managment: Goal: General experience of comfort will improve and/or be controlled Outcome: Progressing

## 2023-12-18 NOTE — Progress Notes (Signed)
 PROGRESS NOTE                                                                                                                                                                                                             Patient Demographics:    Tony Cantu, is a 73 y.o. male, DOB - Oct 09, 1950, ZOX:[id number]  Outpatient Primary MD for the patient is [name], DO    LOS - 10  Admit date - 12/08/2023    Chief Complaint  Patient presents with   Fatigue       Brief Narrative (HPI from H&P)      73 y.o. male with medical history significant of hypertension, hyperlipidemia, diabetes, paroxysmal A-fib, paroxysmal tachycardia, GERD, COPD/asthma, chronic diastolic CHF, NAFLD, cirrhosis, portal hypertension, obesity, OSA, BPH, presenting with altered mental status and nausea vomiting and dark stool.  He is with increased fatigue, and somnolence, workup in ED significant for severe hepatic encephalopathy, anemia, GI consulted, patient went for endoscopy 4/16, significant for PHG, 1 gastric angioectasia s/p APC, GAVE s/p APC, and 2 duodenal angioectasias s/p APC.      Subjective:   No significant events overnight, he denies any complaints today   Assessment  & Plan :   NASH cirrhosis with portal hypertension Melena, coffee-ground emesis on admission History of GAVE Upper GI bleed due to gastric angiectasia, GAVE, duodenal angiectasia. - History of cirrhosis with portal hypertension/varices, with melena as the presenting symptom. -GI Consult greatly appreciated, went for EGD/16, showed PHG, 1 gastric angioectasia s/p APC, GAVE s/p APC, and 2 duodenal angioectasias s/p APC.  These lesions could be causing his acute GI bleed, . - May resume Eliquis  today as discussed with GI , when remained stable . - Continue with PPI twice daily.   - Continue to monitor CBC closely and transfuse as needed, hemoglobin remained stable.    Hyperammonemia Acute hepatic encephalopathy NASH cirrhosis Thrombocytopenia -  improving with high-dose lactulose , Xifaxan , Monia level is trending down, mentation continues to improve, no focal deficits, head CT stable. -Platelet count keep trending down, good variation, no evidence of clumping .   Hypertension   - Not on any antihypertensives, actually his blood pressure is soft   Hyperlipidemia  - Discontinue his statin due to elevated LFTs.   Paroxysmal atrial fibrillation   - Resume amiodarone , with  underlying history of [name] will defer it to his primary cardiologist on long-term use of amiodarone . Eliquis  held/reversed as above.  As needed IV Cardizem  -Amiodarone  decreased due to to bradycardia, he remains in sinus - On Eliquis , monitor closely due to GI bleed and platelets  Transaminitis -LFTs are elevated this morning, no clear etiology, but overall his blood pressure has been on the lower side, will avoid low blood pressure, discontinue statin, avoid Tylenol , r LFTs are trending down  Hypotension -patient blood pressure is soft, symptomatic with dizziness lightheadedness upon standing with PT today, avoid hypotension ,  started on midodrine , will uptitrate dose today, continue with IV albumin  - Will start on TED hose as well.    GERD  - On IV PPI   COPD/asthma  - Resume Singulair  and tolerating p.o.  Not on inhalers, no acute issues   Chronic diastolic CHF  > Last echocardiogram, Was TEE in January 2025 which showed EF 60-65%, normal RV function.  Not on diuretic,  Continue to monitor   QTc prolongation > With underlying right bundle branch block, hard to discern correct QTc.  replace electrolytes and monitor on telemetry.   Hypernatremia.  - resolved with D5W  Hypokalemia Hypomagnesemia - Repleted  Diabetes   -labile, but overall significantly high reading, no hypoglycemia, so I will help Lantus  to 18 units twice daily.     Lab Results  Component Value Date    HGBA1C 4.4 (L) 09/19/2023   CBG (last 3)  Recent Labs    12/17/23 1953 12/18/23 0831 12/18/23 1251  GLUCAP 232* 248* 209*         Condition - Extremely Guarded  Family Communication  : Discussed with wife by phone today.  Code Status : DNR  Consults  :  GI  PUD Prophylaxis : PPI   Procedures  :     CT head nonacute.   CT   abdomen pelvis.   1. Mild circumferential thickening of the rectum with mild to moderate perirectal fat stranding and trace presacral fluid, favoring proctitis. 2. Otherwise, no acute inflammatory process identified within the abdomen or pelvis. 3. Cirrhotic liver with multiple venous collaterals, suggesting sequela of portal hypertension. No splenomegaly or ascites. 4. Multiple other nonacute observations, as described above. Aortic Atherosclerosis (ICD10-I70.0).      Disposition Plan  :    Status is: Inpatient  DVT Prophylaxis  :    Place TED hose Start: 12/14/23 1312 Place and maintain sequential compression device Start: 12/09/23 0555 SCDs Start: 12/08/23 1707    Lab Results  Component Value Date   PLT 80 (L) 12/18/2023    Diet :  Diet Order             Diet heart healthy/carb modified Room service appropriate? Yes; Fluid consistency: Thin  Diet effective now                    Inpatient Medications  Scheduled Meds:  amiodarone   100 mg Oral Daily   dextrose   1 ampule Intravenous Once   Gerhardt's butt cream   Topical TID   insulin  aspart  0-5 Units Subcutaneous QHS   insulin  aspart  0-9 Units Subcutaneous TID WC   insulin  glargine-yfgn  18 Units Subcutaneous BID   lactulose   30 g Oral TID   midodrine   5 mg Oral TID WC   pantoprazole   40 mg Oral BID AC   rifaximin   550 mg Oral BID   sodium chloride  flush  3  mL Intravenous Q12H   Continuous Infusions:   PRN Meds:.acetaminophen  **OR** acetaminophen , diltiazem , melatonin  Antibiotics  :    Anti-infectives (From admission, onward)    Start     Dose/Rate Route  Frequency Ordered Stop   12/10/23 2200  rifaximin  (XIFAXAN ) tablet 550 mg        550 mg Oral 2 times daily 12/10/23 1815     12/09/23 1000  rifaximin  (XIFAXAN ) tablet 550 mg  Status:  Discontinued        550 mg Per Tube 2 times daily 12/09/23 0827 12/10/23 1815         Objective:   Vitals:   12/18/23 0820 12/18/23 0825 12/18/23 0830 12/18/23 1200  BP:   100/60 (!) 124/56  Pulse: 64 63 61 (!) 57  Resp: (!) 21 20 19 19   Temp:   97.9 F (36.6 C)   TempSrc:   Oral   SpO2: 96% 96% 98% 93%  Weight:      Height:        Wt Readings from Last 3 Encounters:  12/09/23 75.1 kg  11/14/23 98.4 kg  11/03/23 85.2 kg     Intake/Output Summary (Last 24 hours) at 12/18/2023 1509 Last data filed at 12/18/2023 0600 Gross per 24 hour  Intake --  Output 1000 ml  Net -1000 ml     Physical Exam  Awake Alert, Oriented X 3, frail, deconditioned Symmetrical Chest [place] movement, Good air movement bilaterally, CTAB RRR,No Gallops,Rubs or new Murmurs, No Parasternal Heave +ve B.Sounds, Abd Soft, No tenderness, No rebound - guarding or rigidity. No Cyanosis, Clubbing or edema, No new Rash or bruise          Data Review:    Recent Labs  Lab 12/12/23 0827 12/13/23 0339 12/14/23 0352 12/16/23 0828 12/17/23 0628 12/18/23 0131  WBC 7.2 7.2 8.0 4.8 4.9 4.5  HGB 11.3* 10.6* 11.9* 9.4* 10.4* 10.5*  HCT 35.6* 32.8* 36.1* 29.1* 31.7* 31.5*  PLT 154 123* 118* 69* 73* 80*  MCV 109.9* 109.7* 106.8* 108.6* 107.5* 106.4*  MCH 34.9* 35.5* 35.2* 35.1* 35.3* 35.5*  MCHC 31.7 32.3 33.0 32.3 32.8 33.3  RDW 18.0* 17.4* 17.1* 16.9* 16.6* 16.1*  LYMPHSABS 1.4 1.7 0.7  --   --   --   MONOABS 0.7 0.7 0.8  --   --   --   EOSABS 0.2 0.2 0.1  --   --   --   BASOSABS 0.1 0.0 0.0  --   --   --     Recent Labs  Lab 12/12/23 0343 12/13/23 0339 12/14/23 0352 12/15/23 0459 12/16/23 0527 12/17/23 0628 12/18/23 0131  NA 151* 140 141 140 140 135  --   K 3.4* 3.3* 3.7 3.6 4.1 3.8  --   CL 121* 113*  106 106 109 105  --   CO2 23 23 24 25 25 22   --   ANIONGAP 7 4* 11 9 6 8   --   GLUCOSE 87 102* 154* 99 169* 203*  --   BUN 5* <5* 5* 6* <5* <5*  --   CREATININE 0.97 0.82 0.85 0.73 0.68 0.63  --   AST 36 40 426* 168* 83* 70*  --   ALT 43 38 200* 134* 92* 78*  --   ALKPHOS 94 84 182* 151* 132* 153*  --   BILITOT 1.1 1.1 1.1 1.2 1.4* 1.0  --   ALBUMIN  2.3* 2.0* 2.2* 2.8* 3.4* 2.9*  --   AMMONIA 53*  52* 24 20  --   --  44*  BNP 388.1* 334.3*  --   --   --   --   --   MG 1.9 1.5* 1.7  --   --   --   --   CALCIUM  9.9 9.1 9.3 9.4 9.6 9.3  --       Recent Labs  Lab 12/12/23 0343 12/13/23 0339 12/14/23 0352 12/15/23 0459 12/16/23 0527 12/17/23 0628 12/18/23 0131  AMMONIA 53* 52* 24 20  --   --  44*  BNP 388.1* 334.3*  --   --   --   --   --   MG 1.9 1.5* 1.7  --   --   --   --   CALCIUM  9.9 9.1 9.3 9.4 9.6 9.3  --     --------------------------------------------------------------------------------------------------------------- Lab Results  Component Value Date   CHOL 126 01/04/2022   HDL 57 01/04/2022   LDLCALC 55 01/04/2022   TRIG 67 01/04/2022   CHOLHDL 2.2 01/04/2022    Lab Results  Component Value Date   HGBA1C 4.4 (L) 09/19/2023   No results for input(s): "TSH", "T4TOTAL", "FREET4", "T3FREE", "THYROIDAB" in the last 72 hours.    Radiology Report No results found.    Signature  -   Seena Dadds M.D on 12/18/2023 at 3:09 PM   -  To page go to [email or web address]

## 2023-12-18 NOTE — Progress Notes (Signed)
 Physical Therapy Treatment Patient Details Name: Tony Cantu MRN: [id number] DOB: 01-04-1951 Today's Date: 12/18/2023   History of Present Illness Patient is 73 yo male admitted on 12/08/23 for AMS, nausea, vomiting, and dark stool. PMHx significant for type 1 diabetes, A-fib on Eliquis , seizure disorder, aortic stenosis, HTN, peptic ulcer disease, OA with bilateral knee replacement, polyclonal gammopathy, OSA on CPAP, GERD, COPD/asthma, chronic diastolic CHF, cirrhosis, portal hypertension, and BPH.    PT Comments  Slow progress, limited by hypotension (see details below.) Required less assistance for bed mobility today. Min assist to roll several times while assisting with peri-care due to stool incontinence. Mod assist to rise to EOB. Immediately dizzy, BP checked - significant hypotension, returned to supine with LEs elevated and improvement in BP and symptoms rapid. Gradually progressed HOB up to 30 deg and tolerated well. Compression stockings donned throughout session. Deferred transfer training until BP more stable. RN notified. Patient will continue to benefit from skilled physical therapy services to further improve independence with functional mobility.    Supine BP 87/64 (MAP 71) Seated 61/42 (MAP 50); supine LEs elevated 110/66. Supine HOB 30 degress 96/66 (MAP 76). Pt symptomatic of dizziness in sitting. Compression stockings were donned with testing.     If plan is discharge home, recommend the following: Two people to help with walking and/or transfers;A lot of help with bathing/dressing/bathroom;Assistance with cooking/housework;Direct supervision/assist for medications management;Direct supervision/assist for financial management;Assist for transportation;Supervision due to cognitive status   Can travel by private vehicle     No  Equipment Recommendations  None recommended by PT (TBD)    Recommendations for Other Services       Precautions / Restrictions  Precautions Precautions: Fall Recall of Precautions/Restrictions: Impaired Precaution/Restrictions Comments: watch BP; Frequentloose stools Restrictions Weight Bearing Restrictions Per Provider Order: No     Mobility  Bed Mobility Overal bed mobility: Needs Assistance Bed Mobility: Rolling, Sidelying to Sit, Sit to Sidelying Rolling: Min assist, Used rails Sidelying to sit: Mod assist, Used rails, HOB elevated     Sit to sidelying: Mod assist General bed mobility comments: Min assist to roll, trouble sequencing, performed to rise to EOB with use of rail, mod assist for trunk support, and to facilitate LEs over bed with multimodal cues. Mod assist for trunk and LEs back into bed. Able to roll lt and Rt several times to assist with peri-care due to bowel incontinence. Could not tolerate sitting due to hypotension symptomatic.    Transfers                   General transfer comment: Deferred due to symptomatic hypotension.    Ambulation/Gait                   Stairs             Wheelchair Mobility     Tilt Bed    Modified Rankin (Stroke Patients Only)       Balance Overall balance assessment: Needs assistance Sitting-balance support: Feet supported, Bilateral upper extremity supported Sitting balance-Leahy Scale: Poor Sitting balance - Comments: static sitting EOB Postural control: Right lateral lean Standing balance support: Bilateral upper extremity supported, Reliant on assistive device for balance Standing balance-Leahy Scale: Poor Standing balance comment: Pt not able to attempt today due to dizziness/fatigue                            Communication Communication Communication: No apparent  difficulties  Cognition Arousal: Alert Behavior During Therapy: WFL for tasks assessed/performed   PT - Cognitive impairments: No family/caregiver present to determine baseline, Awareness, Memory, Attention, Sequencing, Problem solving,  Safety/Judgement, Orientation   Orientation impairments: Situation                   PT - Cognition Comments: Oriented x3. unaware of situation, states he has been walking to the kitchen. Following commands: Impaired Following commands impaired: Only follows one step commands consistently    Cueing Cueing Techniques: Verbal cues, Tactile cues, Gestural cues  Exercises General Exercises - Lower Extremity Ankle Circles/Pumps: AROM, Both, 10 reps, Supine Quad Sets: Strengthening, Both, 10 reps, Supine Gluteal Sets: Strengthening, Both, 10 reps, Supine Other Exercises Other Exercises: Modified bridge x5    General Comments General comments (skin integrity, edema, etc.): Supine BP 87/64 (MAP 71) Seated 61/42 (MAP 50); supine LEs elevated 110/66. Supine HOB 30 degress 96/66 (MAP 76). Pt symptomatic of dizziness in sitting. Compression stockings were donned with testing. RN notified.      Pertinent Vitals/Pain Pain Assessment Pain Assessment: No/denies pain    Home Living                          Prior Function            PT Goals (current goals can now be found in the care plan section) Acute Rehab PT Goals Patient Stated Goal: none stated PT Goal Formulation: Patient unable to participate in goal setting Time For Goal Achievement: 12/23/23 Potential to Achieve Goals: Fair Progress towards PT goals: Progressing toward goals    Frequency    Min 1X/week      PT Plan      Co-evaluation              AM-PAC PT "6 Clicks" Mobility   Outcome Measure  Help needed turning from your back to your side while in a flat bed without using bedrails?: A Little Help needed moving from lying on your back to sitting on the side of a flat bed without using bedrails?: A Lot Help needed moving to and from a bed to a chair (including a wheelchair)?: Total Help needed standing up from a chair using your arms (e.g., wheelchair or bedside chair)?: Total Help needed  to walk in hospital room?: Total Help needed climbing 3-5 steps with a railing? : Total 6 Click Score: 9    End of Session Equipment Utilized During Treatment: Gait belt Activity Tolerance: Treatment limited secondary to medical complications (Comment) (low BP/dizzy) Patient left: in bed;with call bell/phone within reach;with bed alarm set (HOB 30 deg.) Nurse Communication: Mobility status (BP; stool incontinence, cleaned.) PT Visit Diagnosis: Muscle weakness (generalized) (M62.81);Difficulty in walking, not elsewhere classified (R26.2);Other symptoms and signs involving the nervous system (R29.898);Dizziness and giddiness (R42)     Time: 1610-9604 PT Time Calculation (min) (ACUTE ONLY): 22 min  Charges:    $Therapeutic Activity: 8-22 mins PT General Charges $$ ACUTE PT VISIT: 1 Visit                     Jory Ng, PT, DPT [facility]  Rehabilitation Services Physical Therapist Office: [phone] Website: [email or web address]    Alinda Irani 12/18/2023, 4:07 PM

## 2023-12-18 NOTE — Progress Notes (Signed)
 TRH night cross cover note:   I was notified by RN that this patient, who has NASH cirrhosis and was admitted 9 days ago for acute upper gastrointestinal bleed, status post APC on 12/13/2023, has blood pressure that is now slightly softer than earlier in the evening, with midnight blood pressure noted to be 87/57, with MAP of 67.  Vital signs are otherwise stable, including afebrile, heart rates in the 60s, oxygen  saturation in the high 90s on room air.  Reportedly asymptomatic and alert and oriented x 3 at this time.  Is reported to maintain good urine output and took a dose of midodrine  with his 2200 evening medications.   Per my brief review, it appears that his systolic blood pressures overnight last evening were in the 90s.   He does have cirrhosis, and is maintaining maps greater than 65 mmHg, is asymptomatic, and mentating well, with good urine output, I have asked that we collect his a.m. CBC now in order to evaluate for any significant interval decline in hemoglobin given his presentation for acute upper GIB, now with blood pressures that are slightly softer. Will monitor for result of CBC as well as further trending of BP.     Camelia Cavalier, DO Hospitalist

## 2023-12-18 NOTE — TOC Progression Note (Addendum)
 Transition of Care [facility]) - Progression Note    Patient Details  Name: Tony Cantu MRN: [id number] Date of Birth: 12-23-1950  Transition of Care [facility]) CM/SW Contact  Jannice Mends, LCSW Phone Number: 12/18/2023, 3:05 PM  Clinical Narrative:    3pm-CSW spoke with patient's POA and provided semi private SNF bed offers. She requested cost info for private beds. CSW awaiting SNF responses.   4:47 PM-CSW provided updated SNF costs. [name] has selected private room at [facility]. She will make arrangements with [facility] business office tomorrow and come to the hospital to meet with the patient regarding the plan in hopes of getting patient discharged by the end of the week.  Expected Discharge Plan: Skilled Nursing Facility Barriers to Discharge: Continued Medical Work up, English as a second language teacher  Expected Discharge Plan and Services In-house Referral: Clinical Social Work   Post Acute Care Choice: Skilled Nursing Facility Living arrangements for the past 2 months: Single Family Home, Skilled Nursing Facility                                       Social Determinants of Health (SDOH) Interventions SDOH Screenings   Food Insecurity: Patient Unable To Answer (12/09/2023)  Housing: Patient Unable To Answer (12/09/2023)  Recent Concern: Housing - High Risk (10/30/2023)  Transportation Needs: Patient Unable To Answer (12/09/2023)  Utilities: Patient Unable To Answer (12/09/2023)  Depression (PHQ2-9): Medium Risk (09/13/2023)  Financial Resource Strain: Low Risk  (06/09/2022)  Social Connections: Patient Unable To Answer (12/09/2023)  Recent Concern: Social Connections - Socially Isolated (10/30/2023)  Tobacco Use: Medium Risk (12/08/2023)    Readmission Risk Interventions     No data to display

## 2023-12-19 DIAGNOSIS — K922 Gastrointestinal hemorrhage, unspecified: Secondary | ICD-10-CM | POA: Diagnosis not present

## 2023-12-19 LAB — GLUCOSE, CAPILLARY
Glucose-Capillary: 37 mg/dL — CL (ref 70–99)
Glucose-Capillary: 93 mg/dL (ref 70–99)
Glucose-Capillary: 200 mg/dL — ABNORMAL HIGH (ref 70–99)
Glucose-Capillary: 175 mg/dL — ABNORMAL HIGH (ref 70–99)
Glucose-Capillary: 288 mg/dL — ABNORMAL HIGH (ref 70–99)

## 2023-12-19 MED ORDER — MIDODRINE HCL 5 MG PO TABS
10.0000 mg | ORAL_TABLET | Freq: Three times a day (TID) | ORAL | Status: DC
  Administered 2023-12-19 – 2024-01-11 (×71): 10 mg via ORAL
  Filled 2023-12-19 (×72): qty 2

## 2023-12-19 MED ORDER — ALBUMIN HUMAN 25 % IV SOLN
25.0000 g | Freq: Four times a day (QID) | INTRAVENOUS | Status: AC
Start: 2023-12-19 — End: 2023-12-20
  Administered 2023-12-19 – 2023-12-20 (×4): 25 g via INTRAVENOUS
  Filled 2023-12-19 (×4): qty 100

## 2023-12-19 MED ORDER — INSULIN GLARGINE-YFGN 100 UNIT/ML ~~LOC~~ SOLN
12.0000 [IU] | Freq: Two times a day (BID) | SUBCUTANEOUS | Status: DC
  Administered 2023-12-19 – 2023-12-21 (×5): 12 [IU] via SUBCUTANEOUS
  Filled 2023-12-19 (×7): qty 0.12

## 2023-12-19 NOTE — Plan of Care (Signed)
  Problem: Education: Goal: Knowledge of General Education information will improve Description: Including pain rating scale, medication(s)/side effects and non-pharmacologic comfort measures Outcome: Progressing   Problem: Health Behavior/Discharge Planning: Goal: Ability to manage health-related needs will improve Outcome: Progressing   Problem: Clinical Measurements: Goal: Ability to maintain clinical measurements within normal limits will improve Outcome: Progressing Goal: Will remain free from infection Outcome: Progressing   Problem: Activity: Goal: Risk for activity intolerance will decrease Outcome: Progressing   Problem: Nutrition: Goal: Adequate nutrition will be maintained Outcome: Progressing   Problem: Elimination: Goal: Will not experience complications related to bowel motility Outcome: Progressing   Problem: Pain Managment: Goal: General experience of comfort will improve and/or be controlled Outcome: Progressing   Problem: Safety: Goal: Ability to remain free from injury will improve Outcome: Progressing

## 2023-12-19 NOTE — Progress Notes (Signed)
 Patient refuses CPAP. No unit in room at this time. Patient states he doesn't wear one at home.

## 2023-12-19 NOTE — TOC Progression Note (Signed)
 Transition of Care [facility]) - Progression Note    Patient Details  Name: Tony Cantu MRN: [id number] Date of Birth: Dec 19, 1950  Transition of Care [facility]) CM/SW Contact  Jannice Mends, LCSW Phone Number: 12/19/2023, 12:55 PM  Clinical Narrative:    Tony Cantu was able to arrange patient's finances with him today during her visit. Tony Cantu will contact her to arrange payment tomorrow.    Expected Discharge Plan: Skilled Nursing Facility Barriers to Discharge: Continued Medical Work up, English as a second language teacher  Expected Discharge Plan and Services In-house Referral: Clinical Social Work   Post Acute Care Choice: Skilled Nursing Facility Living arrangements for the past 2 months: Single Family Home, Skilled Nursing Facility                                       Social Determinants of Health (SDOH) Interventions SDOH Screenings   Food Insecurity: Patient Unable To Answer (12/09/2023)  Housing: Patient Unable To Answer (12/09/2023)  Recent Concern: Housing - High Risk (10/30/2023)  Transportation Needs: Patient Unable To Answer (12/09/2023)  Utilities: Patient Unable To Answer (12/09/2023)  Depression (PHQ2-9): Medium Risk (09/13/2023)  Financial Resource Strain: Low Risk  (06/09/2022)  Social Connections: Patient Unable To Answer (12/09/2023)  Recent Concern: Social Connections - Socially Isolated (10/30/2023)  Tobacco Use: Medium Risk (12/08/2023)    Readmission Risk Interventions     No data to display

## 2023-12-19 NOTE — Progress Notes (Signed)
 PROGRESS NOTE                                                                                                                                                                                                             Patient Demographics:    Tony Cantu, is a 73 y.o. male, DOB - [birth year], GNF:[id number]  Outpatient Primary MD for the patient is [name], DO    LOS - 11  Admit date - 12/08/2023    Chief Complaint  Patient presents with   Fatigue       Brief Narrative (HPI from H&P)      73 y.o. male with medical history significant of hypertension, hyperlipidemia, diabetes, paroxysmal A-fib, paroxysmal tachycardia, GERD, COPD/asthma, chronic diastolic CHF, NAFLD, cirrhosis, portal hypertension, obesity, OSA, BPH, presenting with altered mental status and nausea vomiting and dark stool.  He is with increased fatigue, and somnolence, workup in ED significant for severe hepatic encephalopathy, anemia, GI consulted, patient went for endoscopy 4/16, significant for PHG, 1 gastric angioectasia s/p APC, GAVE s/p APC, and 2 duodenal angioectasias s/p APC.      Subjective:   Patient was orthostatic yesterday with physical therapy, he reports poor appetite today.   Assessment  & Plan :   NASH cirrhosis with portal hypertension Melena, coffee-ground emesis on admission History of GAVE Upper GI bleed due to gastric angiectasia, GAVE, duodenal angiectasia. - History of cirrhosis with portal hypertension/varices, with melena as the presenting symptom. -GI Consult greatly appreciated, went for EGD/16, showed PHG, 1 gastric angioectasia s/p APC, GAVE s/p APC, and 2 duodenal angioectasias s/p APC.  These lesions could be causing his acute GI bleed, . - May resume Eliquis  today as discussed with GI , when remained stable . - Continue with PPI twice daily.   - Continue to monitor CBC closely and transfuse as needed,  hemoglobin remained stable.   Hyperammonemia Acute hepatic encephalopathy NASH cirrhosis Thrombocytopenia -  improving with high-dose lactulose , Xifaxan , Monia level is trending down, mentation continues to improve, no focal deficits, head CT stable. -Platelet count keep trending down, good variation, no evidence of clumping .   Hypertension   - Not on any antihypertensives, actually his blood pressure is soft   Hyperlipidemia  - Discontinue his statin due to elevated LFTs.   Paroxysmal atrial fibrillation   -  Resume amiodarone , with underlying history of [name] will defer it to his primary cardiologist on long-term use of amiodarone . Eliquis  held/reversed as above.  As needed IV Cardizem  -Amiodarone  decreased due to to bradycardia, he remains in sinus - On Eliquis , monitor closely due to GI bleed and platelets  Transaminitis -LFTs are elevated this morning, no clear etiology, but overall his blood pressure has been on the lower side, will avoid low blood pressure, discontinue statin, avoid Tylenol , r LFTs are trending down  Hypotension -patient blood pressure is soft, symptomatic with dizziness lightheadedness upon standing with PT today, avoid hypotension ,  started on midodrine , will uptitrate dose today, give another dose of IV albumin  x4 - continue  TED hose as well.  GERD  - On IV PPI   COPD/asthma  - Resume Singulair  and tolerating p.o.  Not on inhalers, no acute issues   Chronic diastolic CHF  > Last echocardiogram, Was TEE in January 2025 which showed EF 60-65%, normal RV function.  Not on diuretic,  Continue to monitor   QTc prolongation > With underlying right bundle branch block, hard to discern correct QTc.  replace electrolytes and monitor on telemetry.   Hypernatremia.  - resolved with D5W  Hypokalemia Hypomagnesemia - Repleted  Diabetes   -labile, another episode of hypoglycemia this morning, will decrease his Lantus  to 12 units twice daily   Lab Results   Component Value Date   HGBA1C 4.4 (L) 09/19/2023   CBG (last 3)  Recent Labs    12/19/23 0828 12/19/23 0912 12/19/23 1125  GLUCAP 37* 93 200*         Condition - Extremely Guarded  Family Communication  : Discussed with HCPOA, ex-wife by phone 4/21.  Code Status : DNR  Consults  :  GI  PUD Prophylaxis : PPI   Procedures  :     CT head nonacute.   CT   abdomen pelvis.   1. Mild circumferential thickening of the rectum with mild to moderate perirectal fat stranding and trace presacral fluid, favoring proctitis. 2. Otherwise, no acute inflammatory process identified within the abdomen or pelvis. 3. Cirrhotic liver with multiple venous collaterals, suggesting sequela of portal hypertension. No splenomegaly or ascites. 4. Multiple other nonacute observations, as described above. Aortic Atherosclerosis (ICD10-I70.0).      Disposition Plan  :    Status is: Inpatient  DVT Prophylaxis  :    Place TED hose Start: 12/14/23 1312 Place and maintain sequential compression device Start: 12/09/23 0555 SCDs Start: 12/08/23 1707 apixaban  (ELIQUIS ) tablet 5 mg    Lab Results  Component Value Date   PLT 80 (L) 12/18/2023    Diet :  Diet Order             Diet heart healthy/carb modified Room service appropriate? Yes; Fluid consistency: Thin  Diet effective now                    Inpatient Medications  Scheduled Meds:  amiodarone   100 mg Oral Daily   apixaban   5 mg Oral BID   Gerhardt's butt cream   Topical TID   insulin  aspart  0-5 Units Subcutaneous QHS   insulin  aspart  0-9 Units Subcutaneous TID WC   insulin  glargine-yfgn  12 Units Subcutaneous BID   lactulose   30 g Oral TID   midodrine   10 mg Oral TID WC   pantoprazole   40 mg Oral BID AC   rifaximin   550 mg Oral BID  sodium chloride  flush  3 mL Intravenous Q12H   Continuous Infusions:  albumin  human      PRN Meds:.acetaminophen  **OR** acetaminophen , diltiazem , melatonin  Antibiotics  :     Anti-infectives (From admission, onward)    Start     Dose/Rate Route Frequency Ordered Stop   12/10/23 2200  rifaximin  (XIFAXAN ) tablet 550 mg        550 mg Oral 2 times daily 12/10/23 1815     12/09/23 1000  rifaximin  (XIFAXAN ) tablet 550 mg  Status:  Discontinued        550 mg Per Tube 2 times daily 12/09/23 0827 12/10/23 1815         Objective:   Vitals:   12/18/23 2328 12/19/23 0400 12/19/23 0832 12/19/23 1127  BP: 104/69 104/72 (!) 86/65 99/68  Pulse: (!) 56 64 78 61  Resp: 17 17 16 19   Temp: 98.6 F (37 C) 98.4 F (36.9 C) (!) 97.4 F (36.3 C) 98.7 F (37.1 C)  TempSrc: Oral Oral Oral Oral  SpO2: 98% 97%    Weight:      Height:        Wt Readings from Last 3 Encounters:  12/09/23 75.1 kg  11/14/23 98.4 kg  11/03/23 85.2 kg     Intake/Output Summary (Last 24 hours) at 12/19/2023 1450 Last data filed at 12/19/2023 1216 Gross per 24 hour  Intake --  Output 1200 ml  Net -1200 ml     Physical Exam  Awake Alert, Oriented X 3, frail, deconditioned Symmetrical Chest Harps movement, Good air movement bilaterally, CTAB RRR,No Gallops,Rubs or new Murmurs, No Parasternal Heave +ve B.Sounds, Abd Soft, No tenderness, No rebound - guarding or rigidity. No Cyanosis, Clubbing or edema, wearing TED hose      Data Review:    Recent Labs  Lab 12/13/23 0339 12/14/23 0352 12/16/23 0828 12/17/23 0628 12/18/23 0131  WBC 7.2 8.0 4.8 4.9 4.5  HGB 10.6* 11.9* 9.4* 10.4* 10.5*  HCT 32.8* 36.1* 29.1* 31.7* 31.5*  PLT 123* 118* 69* 73* 80*  MCV 109.7* 106.8* 108.6* 107.5* 106.4*  MCH 35.5* 35.2* 35.1* 35.3* 35.5*  MCHC 32.3 33.0 32.3 32.8 33.3  RDW 17.4* 17.1* 16.9* 16.6* 16.1*  LYMPHSABS 1.7 0.7  --   --   --   MONOABS 0.7 0.8  --   --   --   EOSABS 0.2 0.1  --   --   --   BASOSABS 0.0 0.0  --   --   --     Recent Labs  Lab 12/13/23 0339 12/14/23 0352 12/15/23 0459 12/16/23 0527 12/17/23 0628 12/18/23 0131  NA 140 141 140 140 135  --   K 3.3* 3.7  3.6 4.1 3.8  --   CL 113* 106 106 109 105  --   CO2 23 24 25 25 22   --   ANIONGAP 4* 11 9 6 8   --   GLUCOSE 102* 154* 99 169* 203*  --   BUN <5* 5* 6* <5* <5*  --   CREATININE 0.82 0.85 0.73 0.68 0.63  --   AST 40 426* 168* 83* 70*  --   ALT 38 200* 134* 92* 78*  --   ALKPHOS 84 182* 151* 132* 153*  --   BILITOT 1.1 1.1 1.2 1.4* 1.0  --   ALBUMIN  2.0* 2.2* 2.8* 3.4* 2.9*  --   AMMONIA 52* 24 20  --   --  44*  BNP 334.3*  --   --   --   --   --  MG 1.5* 1.7  --   --   --   --   CALCIUM  9.1 9.3 9.4 9.6 9.3  --       Recent Labs  Lab 12/13/23 0339 12/14/23 0352 12/15/23 0459 12/16/23 0527 12/17/23 0628 12/18/23 0131  AMMONIA 52* 24 20  --   --  44*  BNP 334.3*  --   --   --   --   --   MG 1.5* 1.7  --   --   --   --   CALCIUM  9.1 9.3 9.4 9.6 9.3  --     --------------------------------------------------------------------------------------------------------------- Lab Results  Component Value Date   CHOL 126 01/04/2022   HDL 57 01/04/2022   LDLCALC 55 01/04/2022   TRIG 67 01/04/2022   CHOLHDL 2.2 01/04/2022    Lab Results  Component Value Date   HGBA1C 4.4 (L) 09/19/2023   No results for input(s): "TSH", "T4TOTAL", "FREET4", "T3FREE", "THYROIDAB" in the last 72 hours.    Radiology Report No results found.    Signature  -   Seena Dadds M.D on 12/19/2023 at 2:50 PM   -  To page go to [email or web address]

## 2023-12-19 NOTE — Plan of Care (Signed)
  Problem: Clinical Measurements: Goal: Ability to maintain clinical measurements within normal limits will improve Outcome: Progressing Goal: Diagnostic test results will improve Outcome: Progressing Goal: Respiratory complications will improve Outcome: Progressing   Problem: Activity: Goal: Risk for activity intolerance will decrease Outcome: Progressing   Problem: Elimination: Goal: Will not experience complications related to bowel motility Outcome: Progressing

## 2023-12-20 DIAGNOSIS — I480 Paroxysmal atrial fibrillation: Secondary | ICD-10-CM | POA: Diagnosis not present

## 2023-12-20 DIAGNOSIS — J449 Chronic obstructive pulmonary disease, unspecified: Secondary | ICD-10-CM | POA: Diagnosis not present

## 2023-12-20 DIAGNOSIS — K7682 Hepatic encephalopathy: Secondary | ICD-10-CM | POA: Diagnosis not present

## 2023-12-20 DIAGNOSIS — K922 Gastrointestinal hemorrhage, unspecified: Secondary | ICD-10-CM | POA: Diagnosis not present

## 2023-12-20 LAB — GLUCOSE, CAPILLARY
Glucose-Capillary: 313 mg/dL — ABNORMAL HIGH (ref 70–99)
Glucose-Capillary: 218 mg/dL — ABNORMAL HIGH (ref 70–99)
Glucose-Capillary: 175 mg/dL — ABNORMAL HIGH (ref 70–99)
Glucose-Capillary: 144 mg/dL — ABNORMAL HIGH (ref 70–99)

## 2023-12-20 LAB — CBC
HCT: 34.3 % — ABNORMAL LOW (ref 39.0–52.0)
Hemoglobin: 11.2 g/dL — ABNORMAL LOW (ref 13.0–17.0)
MCH: 34.4 pg — ABNORMAL HIGH (ref 26.0–34.0)
MCHC: 32.7 g/dL (ref 30.0–36.0)
MCV: 105.2 fL — ABNORMAL HIGH (ref 80.0–100.0)
Platelets: 116 10*3/uL — ABNORMAL LOW (ref 150–400)
RBC: 3.26 MIL/uL — ABNORMAL LOW (ref 4.22–5.81)
RDW: 16.5 % — ABNORMAL HIGH (ref 11.5–15.5)
WBC: 5.6 10*3/uL (ref 4.0–10.5)
nRBC: 0 % (ref 0.0–0.2)

## 2023-12-20 NOTE — Plan of Care (Signed)
  Problem: Clinical Measurements: Goal: Diagnostic test results will improve Outcome: Progressing Goal: Respiratory complications will improve Outcome: Progressing   Problem: Activity: Goal: Risk for activity intolerance will decrease Outcome: Progressing   Problem: Nutrition: Goal: Adequate nutrition will be maintained Outcome: Progressing   Problem: Coping: Goal: Level of anxiety will decrease Outcome: Progressing   Problem: Elimination: Goal: Will not experience complications related to bowel motility Outcome: Progressing   Problem: Pain Managment: Goal: General experience of comfort will improve and/or be controlled Outcome: Progressing   Problem: Safety: Goal: Ability to remain free from injury will improve Outcome: Progressing   Problem: Coping: Goal: Ability to adjust to condition or change in health will improve Outcome: Progressing   Problem: Fluid Volume: Goal: Ability to maintain a balanced intake and output will improve Outcome: Progressing   Problem: Nutritional: Goal: Maintenance of adequate nutrition will improve Outcome: Progressing   Problem: Skin Integrity: Goal: Risk for impaired skin integrity will decrease Outcome: Progressing

## 2023-12-20 NOTE — Inpatient Diabetes Management (Signed)
 Inpatient Diabetes Program Recommendations  AACE/ADA: New Consensus Statement on Inpatient Glycemic Control (2015)  Target Ranges:  Prepandial:   less than 140 mg/dL      Peak postprandial:   less than 180 mg/dL (1-2 hours)      Critically ill patients:  140 - 180 mg/dL   Lab Results  Component Value Date   GLUCAP 313 (H) 12/20/2023   HGBA1C 4.4 (L) 09/19/2023    Review of Glycemic Control  Diabetes history: DM2 Outpatient Diabetes medications: Lantus  24 BID, Humalog  2-15 TID with meals and 0-5 HS, Novolog  8 units  Current orders for Inpatient glycemic control: Semglee  12 BID, Novolog  0-9 TID with meals and 0-5 HS  CBG 313 this am  Inpatient Diabetes Program Recommendations:    Consider increasing Semglee  to 18 units BID  Follow.  Thank you. Joni Net, RD, LDN, CDCES Inpatient Diabetes Coordinator [phone]

## 2023-12-20 NOTE — Progress Notes (Signed)
   12/20/23 1923  BiPAP/CPAP/SIPAP  Reason BIPAP/CPAP not in use Non-compliant

## 2023-12-20 NOTE — Discharge Instructions (Signed)

## 2023-12-20 NOTE — Progress Notes (Signed)
 PROGRESS NOTE        PATIENT DETAILS Name: Tony Cantu Age: 73 y.o. Sex: male Date of Birth: 03-Feb-1951 Admit Date: 12/08/2023 Admitting Physician Johnetta Nab, MD [id number], Gwendalyn Lemma, DO  Brief Summary: Patient is a 73 y.o.  male with history of [name] cirrhosis, PAF on anticoagulation who presented with somnolence/fatigue-was found to have hepatic encephalopathy along with upper GI bleeding.  Hospital course complicated by orthostatic hypotension.  Significant events: 4/11>> admit to [facility]  Significant studies: 4/11>> CT abdomen/pelvis: Mild circumferential thickening of the rectum-favoring proctitis. 4/11>> CT head: No acute intracranial abnormality.  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  Consults: GI  Subjective: Lying comfortably in bed-denies any chest pain or shortness of breath.  Objective: Vitals: Blood pressure 98/60, pulse 63, temperature 98.7 F (37.1 C), temperature source Oral, resp. rate 19, height 5' 7.99" (1.727 m), weight 75.1 kg, SpO2 97%.   Exam: Gen Exam:Alert awake-not in any distress HEENT:atraumatic, normocephalic Chest: B/L clear to auscultation anteriorly CVS:S1S2 regular Abdomen:soft non tender, non distended Extremities:no edema Neurology: Non focal Skin: no rash  Pertinent Labs/Radiology:    Latest Ref Rng & Units 12/20/2023    3:44 AM 12/18/2023    1:31 AM 12/17/2023    6:28 AM  CBC  WBC 4.0 - 10.5 K/uL 5.6  4.5  4.9   Hemoglobin 13.0 - 17.0 g/dL 11.9  14.7  82.9   Hematocrit 39.0 - 52.0 % 34.3  31.5  31.7   Platelets 150 - 400 K/uL 116  80  73     Lab Results  Component Value Date   NA 135 12/17/2023   K 3.8 12/17/2023   CL 105 12/17/2023   CO2 22 12/17/2023      Assessment/Plan: Hepatic  encephalopathy Likely triggered by upper GI bleeding Resolved with lactulose /Xifaxan  Completely awake/alert this morning  Upper GI bleeding Secondary to AVMs As/P EGD with APC 1/16 No further bleeding-Hb stable Follow CBC Continue PPI twice daily  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  HLD Statin on hold-resume when able.  Transaminitis Unclear etiology Downtrending Follow-remains on amiodarone -hence watch closely.  COPD/asthma Stable Bronchodilators  DM-2 (A1c 4.4 on 1/21) CBGs on the higher side this morning-had hypoglycemia yesterday Allow permissive hyperglycemia Continue Semglee  12 units daily + SSI  Debility/deconditioning Due to acute illness PT/OT eval-SNF recommended.  Code status:   Code Status: Limited: Do not attempt resuscitation (DNR) -DNR-LIMITED -Do Not Intubate/DNI    DVT Prophylaxis: Place TED hose Start: 12/14/23 1312 Place and maintain sequential compression device Start: 12/09/23 0555 SCDs Start: 12/08/23 1707 apixaban  (ELIQUIS ) tablet 5 mg    Family Communication: None at bedside   Disposition Plan: Status is: Inpatient Remains inpatient appropriate because: Severity of illness   Planned Discharge Destination:Skilled nursing facility   Diet: Diet Order             Diet heart healthy/carb modified Room service appropriate? Yes; Fluid consistency: Thin  Diet effective now  Antimicrobial agents: Anti-infectives (From admission, onward)    Start     Dose/Rate Route Frequency Ordered Stop   12/10/23 2200  rifaximin  (XIFAXAN ) tablet 550 mg        550 mg Oral 2 times daily 12/10/23 1815     12/09/23 1000  rifaximin  (XIFAXAN ) tablet 550 mg   Status:  Discontinued        550 mg Per Tube 2 times daily 12/09/23 0827 12/10/23 1815        MEDICATIONS: Scheduled Meds:  amiodarone   100 mg Oral Daily   apixaban   5 mg Oral BID   Gerhardt's butt cream   Topical TID   insulin  aspart  0-5 Units Subcutaneous QHS   insulin  aspart  0-9 Units Subcutaneous TID WC   insulin  glargine-yfgn  12 Units Subcutaneous BID   lactulose   30 g Oral TID   midodrine   10 mg Oral TID WC   pantoprazole   40 mg Oral BID AC   rifaximin   550 mg Oral BID   sodium chloride  flush  3 mL Intravenous Q12H   Continuous Infusions: PRN Meds:.acetaminophen  **OR** acetaminophen , diltiazem , melatonin   I have personally reviewed following labs and imaging studies  LABORATORY DATA: CBC: Recent Labs  Lab 12/14/23 0352 12/16/23 0828 12/17/23 0628 12/18/23 0131 12/20/23 0344  WBC 8.0 4.8 4.9 4.5 5.6  NEUTROABS 6.4  --   --   --   --   HGB 11.9* 9.4* 10.4* 10.5* 11.2*  HCT 36.1* 29.1* 31.7* 31.5* 34.3*  MCV 106.8* 108.6* 107.5* 106.4* 105.2*  PLT 118* 69* 73* 80* 116*    Basic Metabolic Panel: Recent Labs  Lab 12/14/23 0352 12/15/23 0459 12/16/23 0527 12/17/23 0628  NA 141 140 140 135  K 3.7 3.6 4.1 3.8  CL 106 106 109 105  CO2 24 25 25 22   GLUCOSE 154* 99 169* 203*  BUN 5* 6* <5* <5*  CREATININE 0.85 0.73 0.68 0.63  CALCIUM  9.3 9.4 9.6 9.3  MG 1.7  --   --   --     GFR: Estimated Creatinine Clearance: 78.4 mL/min (by C-G formula based on SCr of 0.63 mg/dL).  Liver Function Tests: Recent Labs  Lab 12/14/23 0352 12/15/23 0459 12/16/23 0527 12/17/23 0628  AST 426* 168* 83* 70*  ALT 200* 134* 92* 78*  ALKPHOS 182* 151* 132* 153*  BILITOT 1.1 1.2 1.4* 1.0  PROT 5.9* 5.3* 5.6* 5.4*  ALBUMIN  2.2* 2.8* 3.4* 2.9*   No results for input(s): "LIPASE", "AMYLASE" in the last 168 hours. Recent Labs  Lab 12/14/23 0352 12/15/23 0459 12/18/23 0131  AMMONIA 24 20 44*    Coagulation Profile: No results for input(s): "INR", "PROTIME" in  the last 168 hours.  Cardiac Enzymes: No results for input(s): "CKTOTAL", "CKMB", "CKMBINDEX", "TROPONINI" in the last 168 hours.  BNP (last 3 results) No results for input(s): "PROBNP" in the last 8760 hours.  Lipid Profile: No results for input(s): "CHOL", "HDL", "LDLCALC", "TRIG", "CHOLHDL", "LDLDIRECT" in the last 72 hours.  Thyroid  Function Tests: No results for input(s): "TSH", "T4TOTAL", "FREET4", "T3FREE", "THYROIDAB" in the last 72 hours.  Anemia Panel: No results for input(s): "VITAMINB12", "FOLATE", "FERRITIN", "TIBC", "IRON ", "RETICCTPCT" in the last 72 hours.  Urine analysis:    Component Value Date/Time   COLORURINE YELLOW 12/08/2023 1733   APPEARANCEUR HAZY (A) 12/08/2023 1733   APPEARANCEUR Clear 07/05/2021 1129   LABSPEC 1.019 12/08/2023 1733   PHURINE 7.0 12/08/2023 1733   GLUCOSEU NEGATIVE 12/08/2023 1733  GLUCOSEU NEGATIVE 04/22/2021 1048   HGBUR NEGATIVE 12/08/2023 1733   BILIRUBINUR NEGATIVE 12/08/2023 1733   BILIRUBINUR Negative 07/05/2021 1129   KETONESUR NEGATIVE 12/08/2023 1733   PROTEINUR NEGATIVE 12/08/2023 1733   UROBILINOGEN 0.2 04/22/2021 1048   NITRITE NEGATIVE 12/08/2023 1733   LEUKOCYTESUR NEGATIVE 12/08/2023 1733    Sepsis Labs: Lactic Acid, Venous    Component Value Date/Time   LATICACIDVEN 1.6 12/08/2023 1406    MICROBIOLOGY: No results found for this or any previous visit (from the past 240 hours).  RADIOLOGY STUDIES/RESULTS: No results found.   LOS: 12 days   Kimberly Penna, MD  [facility]    To contact the attending provider between 7A-7P or the covering provider during after hours 7P-7A, please log into the web site [email or web address] and access using universal [place] password for that web site. If you do not have the password, please call the hospital operator.  12/20/2023, 12:53 PM

## 2023-12-20 NOTE — Plan of Care (Signed)

## 2023-12-20 NOTE — TOC Progression Note (Signed)
 Transition of Care [facility]) - Progression Note    Patient Details  Name: Tony Cantu MRN: [id number] Date of Birth: 08-03-1951  [facility] CM/SW Contact  Jannice Mends, LCSW Phone Number: 12/20/2023, 2:05 PM  Clinical Narrative:    CSW spoke with [name] [phone]) for an update on progress with private paying at [facility]. She stated that the SNF is requesting Bank statements, ssi, pension, and life insurance information which she cannot get access to without POA. Her lawyer is requesting a letter from the MD stating that the patient has capacity and can designate [name] as his POA. CSW will discuss with the MD. [name] stated patient is not yet divorced from his wife who left him so that is also a complicating factor. Patient is documented as OX3.    Expected Discharge Plan: Skilled Nursing Facility Barriers to Discharge: Continued Medical Work up, English as a second language teacher  Expected Discharge Plan and Services In-house Referral: Clinical Social Work   Post Acute Care Choice: Skilled Nursing Facility Living arrangements for the past 2 months: Single Family Home, Skilled Nursing Facility                                       Social Determinants of Health (SDOH) Interventions SDOH Screenings   Food Insecurity: Patient Unable To Answer (12/09/2023)  Housing: Patient Unable To Answer (12/09/2023)  Recent Concern: Housing - High Risk (10/30/2023)  Transportation Needs: Patient Unable To Answer (12/09/2023)  Utilities: Patient Unable To Answer (12/09/2023)  Depression (PHQ2-9): Medium Risk (09/13/2023)  Financial Resource Strain: Low Risk  (06/09/2022)  Social Connections: Patient Unable To Answer (12/09/2023)  Recent Concern: Social Connections - Socially Isolated (10/30/2023)  Tobacco Use: Medium Risk (12/08/2023)    Readmission Risk Interventions     No data to display

## 2023-12-21 DIAGNOSIS — K7682 Hepatic encephalopathy: Secondary | ICD-10-CM | POA: Diagnosis not present

## 2023-12-21 DIAGNOSIS — I480 Paroxysmal atrial fibrillation: Secondary | ICD-10-CM | POA: Diagnosis not present

## 2023-12-21 DIAGNOSIS — J449 Chronic obstructive pulmonary disease, unspecified: Secondary | ICD-10-CM | POA: Diagnosis not present

## 2023-12-21 DIAGNOSIS — K922 Gastrointestinal hemorrhage, unspecified: Secondary | ICD-10-CM | POA: Diagnosis not present

## 2023-12-21 LAB — GLUCOSE, CAPILLARY
Glucose-Capillary: 334 mg/dL — ABNORMAL HIGH (ref 70–99)
Glucose-Capillary: 282 mg/dL — ABNORMAL HIGH (ref 70–99)
Glucose-Capillary: 209 mg/dL — ABNORMAL HIGH (ref 70–99)
Glucose-Capillary: 185 mg/dL — ABNORMAL HIGH (ref 70–99)

## 2023-12-21 MED ORDER — AMIODARONE HCL 100 MG PO TABS
100.0000 mg | ORAL_TABLET | Freq: Every day | ORAL | Status: DC
Start: 2023-12-21 — End: 2024-01-11

## 2023-12-21 MED ORDER — LACTULOSE 10 GM/15ML PO SOLN: 30.0000 g | Freq: Three times a day (TID) | ORAL | Status: DC

## 2023-12-21 MED ORDER — ALBUMIN HUMAN 25 % IV SOLN
25.0000 g | Freq: Four times a day (QID) | INTRAVENOUS | Status: AC
  Administered 2023-12-21: 25 g via INTRAVENOUS
  Administered 2023-12-22: 12.5 g via INTRAVENOUS
  Administered 2023-12-22 (×2): 25 g via INTRAVENOUS
  Filled 2023-12-21 (×4): qty 100

## 2023-12-21 MED ORDER — GLUCERNA SHAKE PO LIQD
237.0000 mL | Freq: Three times a day (TID) | ORAL | Status: DC
  Administered 2023-12-21 – 2024-01-11 (×58): 237 mL via ORAL

## 2023-12-21 MED ORDER — LANTUS SOLOSTAR 100 UNIT/ML ~~LOC~~ SOPN: 12.0000 [IU] | PEN_INJECTOR | Freq: Two times a day (BID) | SUBCUTANEOUS | Status: DC

## 2023-12-21 MED ORDER — MELATONIN 3 MG PO TABS: 3.0000 mg | ORAL_TABLET | Freq: Every evening | ORAL | Status: DC | PRN

## 2023-12-21 MED ORDER — MIDODRINE HCL 10 MG PO TABS: 10.0000 mg | ORAL_TABLET | Freq: Three times a day (TID) | ORAL | Status: DC

## 2023-12-21 MED ORDER — IPRATROPIUM-ALBUTEROL 0.5-2.5 (3) MG/3ML IN SOLN: 3.0000 mL | Freq: Four times a day (QID) | RESPIRATORY_TRACT | Status: DC | PRN

## 2023-12-21 MED ORDER — LACTATED RINGERS IV BOLUS
1000.0000 mL | Freq: Once | INTRAVENOUS | Status: AC
  Administered 2023-12-21: 1000 mL via INTRAVENOUS

## 2023-12-21 MED ORDER — RIFAXIMIN 550 MG PO TABS: 550.0000 mg | ORAL_TABLET | Freq: Two times a day (BID) | ORAL | Status: DC

## 2023-12-21 NOTE — Significant Event (Signed)
 Notified of hypotension, systolic in 80s. Decreased PO intake all day. Already on Midodrine  10 mg TID. Will give 1 L IVF and reasses BP trend   Arnulfo Larch, MD  [facility]

## 2023-12-21 NOTE — Discharge Summary (Signed)
 PATIENT DETAILS Name: Tony Cantu Age: 73 y.o. Sex: male Date of Birth: 1951/03/14 MRN: [id number]. Admitting Physician: Tony Nab, MD Tony Cantu, Tony Lemma, DO  Admit Date: 12/08/2023 Discharge date: 12/22/2023  Recommendations for Outpatient Follow-up:  Follow up with PCP in 1-2 weeks Please obtain LFTs/CMP/CBC in one week  Admitted From:  Home  Disposition: Skilled nursing facility   Discharge Condition: stable  CODE STATUS:   Code Status: Limited: Do not attempt resuscitation (DNR) -DNR-LIMITED -Do Not Intubate/DNI    Diet recommendation:  Diet Order             Diet - low sodium heart healthy           Diet Carb Modified           Diet heart healthy/carb modified Room service appropriate? Yes; Fluid consistency: Thin  Diet effective now                    Brief Summary: Patient is a 73 y.o.  male with history of Nash cirrhosis, PAF on anticoagulation who presented with somnolence/fatigue-was found to have hepatic encephalopathy along with upper GI bleeding.  Hospital course complicated by orthostatic hypotension.   Significant events: 4/11>> admit to [facility]   Significant studies: 4/11>> CT abdomen/pelvis: Mild circumferential thickening of the rectum-favoring proctitis. 4/11>> CT head: No acute intracranial abnormality.   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  Continues to have intermittent issues with mild hypotension for which she is asymptomatic Continue midodrine  Continue to hold all antihypertensives.   Chronic HFpEF Euvolemic on exam Diuretics as needed   PAF Amiodarone  Eliquis . PCP/primary cardiology to follow LFTs closely-given that he is on amiodarone  and has underlying liver cirrhosis.   HLD Continue to hold statins until LFTs/transaminases improved further-repeat LFTs in 1 week.   Transaminitis Unclear etiology Downtrending-repeat LFTs in 1 week. Follow-remains on amiodarone -hence watch closely. Statin on hold-see above.   COPD/asthma Stable Prn Bronchodilators   DM-2 (A1c 4.4 on 1/21) CBGs stable-allow some amount of permissive hyperglycemia.  No further concerns of hypoglycemia since 4/22. Continue Semglee  15 units twice daily + SSI   Debility/deconditioning Due to acute illness PT/OT eval-SNF recommended.   Discharge Diagnoses:  Principal Problem:   Acute GI bleeding Active Problems:   Asthma   Obesity   OSA (obstructive sleep apnea)   Diabetes type 2, controlled (HCC)   BPH (benign prostatic hyperplasia)   Paroxysmal tachycardia (HCC)   Hypertension associated with type 2 diabetes mellitus (HCC)   Hyperlipidemia associated with type 2 diabetes mellitus (HCC)   GERD with esophagitis   PAF (paroxysmal atrial fibrillation) (HCC)   Grade I diastolic dysfunction   Chronic obstructive pulmonary disease (HCC)   NAFLD (nonalcoholic fatty liver disease)   Portal hypertension (HCC)   Melena   Other cirrhosis of liver (HCC)   Hepatic encephalopathy (HCC)  ABLA (acute blood loss anemia)   Altered mental status   Discharge Instructions:  Activity:  As tolerated with Full fall precautions use walker/cane & assistance as needed   Discharge Instructions      Call MD for:  extreme fatigue   Complete by: As directed    Call MD for:  persistant dizziness or light-headedness   Complete by: As directed    Diet - low sodium heart healthy   Complete by: As directed    Diet Carb Modified   Complete by: As directed    Discharge instructions   Complete by: As directed    Follow with Primary MD  Tony Guerin, DO in 1-2 weeks  Please get a complete blood count and chemistry panel checked by your Primary MD at your next visit, and again as instructed by your Primary MD.  Get Medicines reviewed and adjusted: Please take all your medications with you for your next visit with your Primary MD  Laboratory/radiological data: Please request your Primary MD to go over all hospital tests and procedure/radiological results at the follow up, please ask your Primary MD to get all Hospital records sent to his/her office.  In some cases, they will be blood work, cultures and biopsy results pending at the time of your discharge. Please request that your primary care M.D. follows up on these results.  Also Note the following: If you experience worsening of your admission symptoms, develop shortness of breath, life threatening emergency, suicidal or homicidal thoughts you must seek medical attention immediately by calling 911 or calling your MD immediately  if symptoms less severe.  You must read complete instructions/literature along with all the possible adverse reactions/side effects for all the Medicines you take and that have been prescribed to you. Take any new Medicines after you have completely understood and accpet all the possible adverse reactions/side effects.   Do not drive when taking Pain medications or sleeping medications (Benzodaizepines)  Do not take more than prescribed Pain, Sleep and Anxiety Medications. It is not advisable to combine anxiety,sleep and pain medications without talking with your primary care practitioner  Special  Instructions: If you have smoked or chewed Tobacco  in the last 2 yrs please stop smoking, stop any regular Alcohol  and or any Recreational drug use.  Wear Seat belts while driving.  Please note: You were cared for by a hospitalist during your hospital stay. Once you are discharged, your primary care physician will handle any further medical issues. Please note that NO REFILLS for any discharge medications will be authorized once you are discharged, as it is imperative that you return to your primary care physician (or establish a relationship with a primary care physician if you do not have one) for your post hospital discharge needs so that they can reassess your need for medications and monitor your lab values.   Non-Pharmacologic Reccomendations for Orthostatic Hypotension 1) Lifestyle modification. These measures include:             - Arising slowly, in stages, from supine to seated to standing. This maneuver is most important in the morning, when orthostatic tolerance is lowest.             - Avoiding straining, coughing, and walking in hot weather.These activities reduce venous return and worsen orthostatic hypotension.             - Maintaining hydration and avoiding over-heating.             -  Raising the head of the bed 30 to 45 degrees decreases renal perfusion, thereby activating the renin-angiotensin-aldosterone system and decreasing nocturnal diuresis, which can be pronounced in these patients.  These changes relieve orthostatic hypotension by expanding extracellular fluid volume and may reduce end organ damage by reducing supine HTN.             2) Exercise - walking 30 minutes a day, exercise in a swimming pool, exercise in a recumbent or seated position (using a stationary bike or rowing machine)             3) Abdominal binders              4) Compression Stockings             5) Increased salt and water  intake to 2 L to 2.5 L of water  a day             6) Modification of  meals:             - Avoiding large meals             - Ingesting meals low in carbohydrates             - Alcohol should be avoided during the day as it is a vasodilator             - Drink water  with meals             - Avoiding activities or sudden standing immediately after eating             7) Physical Countermaneuvers during daily activities: leg crossing, standing on tip toes, squatting   Check CBGs AC and at bedtime.   Discharge wound care:   Complete by: As directed    Wound care  3 times daily      Comments: Cleanse buttock/sacrum/coccyx with Vashe wound cleanser Timm Foot [phone]), do not rinse and allow to air dry. Apply Gerhardt's Butt Cream 3 times a day and prn soiling.   Increase activity slowly   Complete by: As directed       Allergies as of 12/22/2023       Reactions   Dimetapp Children's Cold-cough Other (See Comments)   Chest discomfort    Ak-mycin [erythromycin] Diarrhea   Sulfonamide Derivatives Diarrhea        Medication List     STOP taking these medications    amoxicillin -clavulanate 875-125 MG tablet Commonly known as: AUGMENTIN    atorvastatin  40 MG tablet Commonly known as: LIPITOR   potassium chloride  SA 20 MEQ tablet Commonly known as: KLOR-CON  M       TAKE these medications    acetaminophen  500 MG tablet Commonly known as: TYLENOL  Take 1,000 mg by mouth every 6 (six) hours as needed for moderate pain.   amiodarone  100 MG tablet Commonly known as: PACERONE  Take 1 tablet (100 mg total) by mouth daily. 400 mg  po daily from 3/7 to 3/11 200 mg  po daily from 3/12 What changed:  medication strength how much to take how to take this when to take this   apixaban  5 MG Tabs tablet Commonly known as: ELIQUIS  Take 1 tablet (5 mg total) by mouth 2 (two) times daily.   b complex vitamins tablet Take 1 tablet by mouth daily.   busPIRone 5 MG tablet Commonly known as: BUSPAR Take 5 mg by mouth 2 (two) times daily.    dextromethorphan-guaiFENesin 10-100 MG/5ML liquid Commonly known as:  ROBITUSSIN-DM Take 10 mLs by mouth in the morning and at bedtime.   ergocalciferol  1.25 MG (50000 UT) capsule Commonly known as: VITAMIN D2 Take 50,000 Units by mouth See admin instructions. Take 1 capsule (50,000 units) by mouth twice weekly on Wednesday and Sunday.   ferrous sulfate  325 (65 FE) MG EC tablet Take 1 tablet (325 mg total) by mouth daily with breakfast.   Glucagon  Emergency 1 MG Kit INJECT AS DIRECTED INTO  UPPER ARM, THIGH OR  BUTTOCKS AS NEEDED FOR  SEVERE HYPOGLYCEMIA. SEEK  MEDICAL ATTENTION AFTER USE   Glucerna Liqd Take 120-237 mLs by mouth See admin instructions. Drink 1 bottle 3 times daily, after meals at 1000, 1300 and 1800. If BS < 60, provide of Glucerna and recheck blood sugar in 15 minutes.   HumaLOG  KwikPen 100 UNIT/ML KwikPen Generic drug: insulin  lispro Inject 2-15 Units into the skin See admin instructions. Inject 2-15 units subcutaneously three times daily before meals and at bedtime, per sliding scale: 121-150 : 2 units 151-200 : 3 units 201-250 : 5 units 251-300 : 8 units 301-350 : 11 units 351-400 : 15 units  Notify provider if BS < 70 or > 400. What changed: Another medication with the same name was removed. Continue taking this medication, and follow the directions you see here.   ipratropium-albuterol  0.5-2.5 (3) MG/3ML Soln Commonly known as: DUONEB Take 3 mLs by nebulization every 6 (six) hours as needed.   lactulose  10 GM/15ML solution Commonly known as: CHRONULAC  Take 45 mLs (30 g total) by mouth 3 (three) times daily.   Lantus  SoloStar 100 UNIT/ML Solostar Pen Generic drug: insulin  glargine Inject 15 Units into the skin every 12 (twelve) hours. What changed: how much to take   magnesium  oxide 400 (240 Mg) MG tablet Commonly known as: MAG-OX Take 400 mg by mouth daily.   melatonin 3 MG Tabs tablet Take 1 tablet (3 mg total) by mouth at bedtime as  needed.   midodrine  10 MG tablet Commonly known as: PROAMATINE  Take 1 tablet (10 mg total) by mouth 3 (three) times daily with meals.   montelukast  10 MG tablet Commonly known as: SINGULAIR  Take 1 tablet (10 mg total) by mouth at bedtime.   multivitamin with minerals tablet Take 1 tablet by mouth daily.   pantoprazole  40 MG tablet Commonly known as: PROTONIX  TAKE 1 TABLET BY MOUTH TWICE  DAILY BEFORE MEALS   ramelteon  8 MG tablet Commonly known as: Rozerem  Take 1 tablet (8 mg total) by mouth at bedtime.   rifaximin  550 MG Tabs tablet Commonly known as: XIFAXAN  Take 1 tablet (550 mg total) by mouth 2 (two) times daily.               Discharge Care Instructions  (From admission, onward)           Start     Ordered   12/21/23 0000  Discharge wound care:       Comments: Wound care  3 times daily      Comments: Cleanse buttock/sacrum/coccyx with Vashe wound cleanser Timm Foot [phone]), do not rinse and allow to air dry. Apply Gerhardt's Butt Cream 3 times a day and prn soiling.   12/21/23 0826            Contact information for follow-up providers     Vicky Grange M, DO. Schedule an appointment as soon as possible for a visit in 1 week(s).   Specialty: Family Medicine Contact information: [address]  [place] [address] [phone]              Contact information for after-discharge care     Destination     [facility] Preferred SNF .   Service: Skilled Nursing Contact information: [address] [place]  [address] [phone]                    Allergies  Allergen Reactions   Dimetapp Children's Cold-Cough Other (See Comments)    Chest discomfort    Ak-Mycin [Erythromycin] Diarrhea   Sulfonamide Derivatives Diarrhea     Other Procedures/Studies: DG Chest Port 1 View Result Date: 12/11/2023 CLINICAL DATA:  Shortness of breath EXAM: PORTABLE CHEST 1 VIEW COMPARISON:  12/08/2023 FINDINGS:  Normal heart size and mediastinal contours. Elevated right diaphragm. There is no edema, consolidation, effusion, or pneumothorax. Bilateral glenohumeral arthroplasty and generator pack over the right chest. Extensive artifact from EKG leads. IMPRESSION: Stable exam.  No evidence of acute disease. Electronically Signed   By: Ronnette Coke M.D.   On: 12/11/2023 06:53   DG Abd 1 View Result Date: 12/08/2023 CLINICAL DATA:  NG tube placement EXAM: ABDOMEN - 1 VIEW COMPARISON:  None Available. FINDINGS: NG tube is in the proximal to mid stomach. Nonobstructive bowel gas pattern. IMPRESSION: NG tube in the stomach. Electronically Signed   By: Janeece Mechanic M.D.   On: 12/08/2023 23:14   CT ABDOMEN PELVIS W CONTRAST Result Date: 12/08/2023 CLINICAL DATA:  Abdominal pain, acute, nonlocalized EXAM: CT ABDOMEN AND PELVIS WITH CONTRAST TECHNIQUE: Multidetector CT imaging of the abdomen and pelvis was performed using the standard protocol following bolus administration of intravenous contrast. RADIATION DOSE REDUCTION: This exam was performed according to the departmental dose-optimization program which includes automated exposure control, adjustment of the mA and/or kV according to patient size and/or use of iterative reconstruction technique. CONTRAST:  75mL OMNIPAQUE  IOHEXOL  350 MG/ML SOLN COMPARISON:  CT scan abdomen and pelvis from 09/19/2023. FINDINGS: Lower chest: The lung bases are clear. No pleural effusion. The heart is normal in size. No pericardial effusion. Dense mitral annulus and aortic valve calcifications noted. Hepatobiliary: The liver is normal in size. Cirrhotic liver configuration. No focal mass seen within the limitations of this exam. No intrahepatic or extrahepatic bile duct dilation. No calcified gallstones. Normal gallbladder Wil[place]ie thickness. No pericholecystic inflammatory changes. Pancreas: Small/atrophic pancreas.  No discrete focal mass seen. Spleen: Within normal limits. No focal lesion.  Adrenals/Urinary Tract: Unremarkable right adrenal gland. Left adrenal gland is partially visualized surrounded by multiple venous collaterals. No discrete focal mass seen. No suspicious renal mass. There are multiple bilateral simple renal cysts. There are at least 3, 2-3 mm nonobstructing calculi in the left kidney and at least 1, 1 mm sized nonobstructing calculus in the right kidney. No ureterolithiasis or obstructive uropathy. Unremarkable urinary bladder. Stomach/Bowel: No disproportionate dilation of the small or large bowel loops. Unremarkable appendix. There is mild circumferential thickening of the rectum with mild to moderate perirectal fat stranding and trace presacral fluid, favoring proctitis. Correlate clinically. Vascular/Lymphatic: No ascites or pneumoperitoneum. No abdominal or pelvic lymphadenopathy, by size criteria. No aneurysmal dilation of the major abdominal arteries. There are mild peripheral atherosclerotic vascular calcifications of the aorta and its major branches. Reproductive: Normal size prostate. Symmetric seminal vesicles. Other: There is a tiny fat containing umbilical hernia. The soft tissues and abdominal Sao are otherwise unremarkable. Musculoskeletal: No suspicious osseous lesions. There are mild - moderate multilevel degenerative changes in the visualized spine.  IMPRESSION: 1. Mild circumferential thickening of the rectum with mild to moderate perirectal fat stranding and trace presacral fluid, favoring proctitis. 2. Otherwise, no acute inflammatory process identified within the abdomen or pelvis. 3. Cirrhotic liver with multiple venous collaterals, suggesting sequela of portal hypertension. No splenomegaly or ascites. 4. Multiple other nonacute observations, as described above. Aortic Atherosclerosis (ICD10-I70.0). Electronically Signed   By: Beula Brunswick M.D.   On: 12/08/2023 16:50   CT HEAD WO CONTRAST ( ) Result Date: 12/08/2023 CLINICAL DATA:  Provided history:  Mental status change, persistent or worsening. Altered mental status. On blood thinners. Fatigue. EXAM: CT HEAD WITHOUT CONTRAST TECHNIQUE: Contiguous axial images were obtained from the base of the skull through the vertex without intravenous contrast. RADIATION DOSE REDUCTION: This exam was performed according to the departmental dose-optimization program which includes automated exposure control, adjustment of the mA and/or kV according to patient size and/or use of iterative reconstruction technique. COMPARISON:  Brain MRI 09/20/2023. Head CT 09/20/2023. FINDINGS: Brain: Generalized parenchymal atrophy. Prominent perivascular space versus chronic lacunar infarct again demonstrated within the right frontal lobe centrum semiovale (series 6, image 22). Mild patchy and ill-defined hypoattenuation elsewhere within the cerebral white matter, nonspecific but compatible with chronic small vessel disease. There is no acute intracranial hemorrhage. No demarcated cortical infarct. No extra-axial fluid collection. No evidence of an intracranial mass. No midline shift. Vascular: No hyperdense vessel.  Atherosclerotic calcifications. Skull: No calvarial fracture or aggressive osseous lesion. Sinuses/Orbits: No mass or acute finding within the imaged orbits. No significant paranasal sinus disease. Other: Surgical fixation hardware along the nasal bones, bilaterally. IMPRESSION: 1. No evidence of an acute intracranial abnormality. 2. Prominent perivascular space versus chronic lacunar infarct within the right frontal lobe centrum semiovale, unchanged. 3. Background mild cerebral white matter chronic small vessel ischemic disease. 4. Generalized parenchymal atrophy. Electronically Signed   By: Bascom Lily D.O.   On: 12/08/2023 16:43   DG Chest Portable 1 View Result Date: 12/08/2023 CLINICAL DATA:  Altered mental status. EXAM: PORTABLE CHEST 1 VIEW COMPARISON:  Chest radiograph dated 10/24/2023. FINDINGS: Stable  cardiomediastinal silhouette. Aortic atherosclerosis. Low lung volumes. No focal consolidation, sizeable pleural effusion, or pneumothorax. Stable right chest generator device. Prior reverse right shoulder arthroplasty. No acute osseous abnormality. IMPRESSION: Low lung volumes.  No acute cardiopulmonary findings. Electronically Signed   By: Mannie Seek M.D.   On: 12/08/2023 15:10     TODAY-DAY OF DISCHARGE:  Subjective:   Margorie Shelter today has no headache,no chest abdominal pain,no new weakness tingling or numbness, feels much better wants to go home today.  Objective:   Blood pressure (!) 105/57, pulse (!) 57, temperature 98 F (36.7 C), temperature source Oral, resp. rate 18, height 5' 7.99" (1.727 m), weight 75.1 kg, SpO2 91%.  Intake/Output Summary (Last 24 hours) at 12/22/2023 0900 Last data filed at 12/22/2023 5956 Gross per 24 hour  Intake 440 ml  Output 1500 ml  Net -1060 ml   Filed Weights   12/09/23 0800  Weight: 75.1 kg    Exam: Awake Alert, Oriented *3, No new F.N deficits, Normal affect Thomson.AT,PERRAL Supple Neck,No JVD, No cervical lymphadenopathy appriciated.  Symmetrical Chest Poyer movement, Good air movement bilaterally, CTAB RRR,No Gallops,Rubs or new Murmurs, No Parasternal Heave +ve B.Sounds, Abd Soft, Non tender, No organomegaly appriciated, No rebound -guarding or rigidity. No Cyanosis, Clubbing or edema, No new Rash or bruise   PERTINENT RADIOLOGIC STUDIES: No results found.   PERTINENT LAB RESULTS: CBC: Recent Labs  12/20/23 0344  WBC 5.6  HGB 11.2*  HCT 34.3*  PLT 116*   CMET CMP     Component Value Date/Time   NA 135 12/17/2023 0628   NA 142 11/14/2023 1017   K 3.8 12/17/2023 0628   CL 105 12/17/2023 0628   CO2 22 12/17/2023 0628   GLUCOSE 203 (H) 12/17/2023 0628   BUN <5 (L) 12/17/2023 0628   BUN 8 11/14/2023 1017   CREATININE 0.63 12/17/2023 0628   CREATININE 0.82 01/07/2013 0840   CALCIUM  9.3 12/17/2023 0628   PROT  5.4 (L) 12/17/2023 0628   PROT 6.6 11/14/2023 1017   ALBUMIN  2.9 (L) 12/17/2023 0628   ALBUMIN  3.3 (L) 11/14/2023 1017   AST 70 (H) 12/17/2023 0628   ALT 78 (H) 12/17/2023 0628   ALKPHOS 153 (H) 12/17/2023 0628   BILITOT 1.0 12/17/2023 0628   BILITOT 1.2 11/14/2023 1017   GFR 77.82 08/03/2022 1106   EGFR 92 11/14/2023 1017   GFRNONAA >60 12/17/2023 0628   GFRNONAA >89 01/07/2013 0840    GFR Estimated Creatinine Clearance: 78.4 mL/min (by C-G formula based on SCr of 0.63 mg/dL). No results for input(s): "LIPASE", "AMYLASE" in the last 72 hours. No results for input(s): "CKTOTAL", "CKMB", "CKMBINDEX", "TROPONINI" in the last 72 hours. Invalid input(s): "POCBNP" No results for input(s): "DDIMER" in the last 72 hours. No results for input(s): "HGBA1C" in the last 72 hours. No results for input(s): "CHOL", "HDL", "LDLCALC", "TRIG", "CHOLHDL", "LDLDIRECT" in the last 72 hours. No results for input(s): "TSH", "T4TOTAL", "T3FREE", "THYROIDAB" in the last 72 hours.  Invalid input(s): "FREET3" No results for input(s): "VITAMINB12", "FOLATE", "FERRITIN", "TIBC", "IRON ", "RETICCTPCT" in the last 72 hours. Coags: No results for input(s): "INR" in the last 72 hours.  Invalid input(s): "PT" Microbiology: No results found for this or any previous visit (from the past 240 hours).  FURTHER DISCHARGE INSTRUCTIONS:  Get Medicines reviewed and adjusted: Please take all your medications with you for your next visit with your Primary MD  Laboratory/radiological data: Please request your Primary MD to go over all hospital tests and procedure/radiological results at the follow up, please ask your Primary MD to get all Hospital records sent to his/her office.  In some cases, they will be blood work, cultures and biopsy results pending at the time of your discharge. Please request that your primary care M.D. goes through all the records of your hospital data and follows up on these results.  Also  Note the following: If you experience worsening of your admission symptoms, develop shortness of breath, life threatening emergency, suicidal or homicidal thoughts you must seek medical attention immediately by calling 911 or calling your MD immediately  if symptoms less severe.  You must read complete instructions/literature along with all the possible adverse reactions/side effects for all the Medicines you take and that have been prescribed to you. Take any new Medicines after you have completely understood and accpet all the possible adverse reactions/side effects.   Do not drive when taking Pain medications or sleeping medications (Benzodaizepines)  Do not take more than prescribed Pain, Sleep and Anxiety Medications. It is not advisable to combine anxiety,sleep and pain medications without talking with your primary care practitioner  Special Instructions: If you have smoked or chewed Tobacco  in the last 2 yrs please stop smoking, stop any regular Alcohol  and or any Recreational drug use.  Wear Seat belts while driving.  Please note: You were cared for by a hospitalist during your  hospital stay. Once you are discharged, your primary care physician will handle any further medical issues. Please note that NO REFILLS for any discharge medications will be authorized once you are discharged, as it is imperative that you return to your primary care physician (or establish a relationship with a primary care physician if you do not have one) for your post hospital discharge needs so that they can reassess your need for medications and monitor your lab values.  Total Time spent coordinating discharge including counseling, education and face to face time equals greater than 30 minutes.  Signed: Shareen Capwell 12/22/2023 9:00 AM

## 2023-12-21 NOTE — Plan of Care (Signed)

## 2023-12-21 NOTE — Progress Notes (Signed)
 Occupational Therapy Treatment Patient Details Name: Tony Cantu MRN: [id number] DOB: [birth year] Today's Date: 12/21/2023   History of present illness Patient is 73 yo male admitted on 12/08/23 for AMS, nausea, vomiting, and dark stool. PMHx significant for type 1 diabetes, A-fib on Eliquis , seizure disorder, aortic stenosis, HTN, peptic ulcer disease, OA with bilateral knee replacement, polyclonal gammopathy, OSA on CPAP, GERD, COPD/asthma, chronic diastolic CHF, cirrhosis, portal hypertension, and BPH.   OT comments  Pt at this time presented in bed and agreed to attempt with therapist. He reported only eating about 5 bites of food this morning. He was initially set up in sitting position in bed and ted hose were donned prior to mobility. Then when attempting to get to EOB pt noted to bed wet and had a BM. Pt required total assist for peri care and noted to start to become dizzy while completion of hygiene and required rest and repositioned. BP went from 102/46 to 70s/40s when completion of peri care and then at the end of session was at 95/52. Nursing was present at end of session.       If plan is discharge home, recommend the following:  Two people to help with walking and/or transfers;A lot of help with bathing/dressing/bathroom;Assistance with cooking/housework;Direct supervision/assist for medications management;Direct supervision/assist for financial management;Assist for transportation;Help with stairs or ramp for entrance;Supervision due to cognitive status   Equipment Recommendations  Other (comment) (TBD)    Recommendations for Other Services      Precautions / Restrictions Precautions Precautions: Fall Recall of Precautions/Restrictions: Impaired Precaution/Restrictions Comments: watch BP; Frequentloose stools Restrictions Weight Bearing Restrictions Per Provider Order: No       Mobility Bed Mobility Overal bed mobility: Needs Assistance Bed Mobility: Rolling Rolling:  Min assist, Mod assist, Used rails         General bed mobility comments: attempted to complete supine to sitting but noted to have a BM    Transfers                   General transfer comment: attempted but then due to BMS/urination and low BP was unable to complete     Balance                                           ADL either performed or assessed with clinical judgement   ADL Overall ADL's : Needs assistance/impaired Eating/Feeding: Sitting;Set up Eating/Feeding Details (indicate cue type and reason): only had about 5 bites of food Grooming: Wash/dry hands;Wash/dry face;Set up   Upper Body Bathing: Minimal assistance;Bed level   Lower Body Bathing: Maximal assistance;Bed level   Upper Body Dressing : Minimal assistance;Sitting   Lower Body Dressing: Maximal assistance;Sit to/from stand       Toileting- Architect and Hygiene: Total assistance              Extremity/Trunk Assessment Upper Extremity Assessment Upper Extremity Assessment: Generalized weakness   Lower Extremity Assessment Lower Extremity Assessment: Defer to PT evaluation        Vision   Vision Assessment?: No apparent visual deficits   Perception Perception Perception: Not tested   Praxis Praxis Praxis: Not tested   Communication Communication Communication: No apparent difficulties   Cognition Arousal: Alert Behavior During Therapy: WFL for tasks assessed/performed Cognition: No family/caregiver present to determine baseline     Awareness: Online awareness intact  Memory impairment (select all impairments): Short-term memory Attention impairment (select first level of impairment): Divided attention Executive functioning impairment (select all impairments): Problem solving, Reasoning, Sequencing, Organization                   Following commands: Impaired Following commands impaired: Only follows one step commands consistently       Cueing   Cueing Techniques: Verbal cues, Tactile cues, Gestural cues  Exercises      Shoulder Instructions       General Comments      Pertinent Vitals/ Pain       Pain Assessment Pain Assessment: Faces Faces Pain Scale: Hurts a little bit Breathing: normal Negative Vocalization: none Facial Expression: smiling or inexpressive Body Language: tense, distressed pacing, fidgeting Consolability: no need to console PAINAD Score: 1 Pain Location: when attempting to position in bed with BLE noted grimacing on face Pain Descriptors / Indicators: Discomfort, Grimacing, Guarding, Sore Pain Intervention(s): Limited activity within patient's tolerance, Monitored during session  Home Living                                          Prior Functioning/Environment              Frequency  Min 1X/week        Progress Toward Goals  OT Goals(current goals can now be found in the care plan section)  Progress towards OT goals: Not progressing toward goals - comment (due to bp)  Acute Rehab OT Goals Patient Stated Goal: none OT Goal Formulation: With patient Time For Goal Achievement: 01/04/24 Potential to Achieve Goals: Fair ADL Goals Pt Will Perform Grooming: with set-up;sitting Pt Will Perform Upper Body Dressing: with supervision;sitting Pt Will Perform Lower Body Dressing: with mod assist;sit to/from stand;sitting/lateral leans Pt Will Transfer to Toilet: with min assist;stand pivot transfer Pt Will Perform Toileting - Clothing Manipulation and hygiene: with mod assist;sitting/lateral leans;sit to/from stand Additional ADL Goal #1: Pt will complete bed mobility with CGA in preparation for OOB functional tasks  Plan      Co-evaluation                 AM-PAC OT "6 Clicks" Daily Activity     Outcome Measure   Help from another person eating meals?: A Little Help from another person taking care of personal grooming?: A Little Help from another  person toileting, which includes using toliet, bedpan, or urinal?: Total Help from another person bathing (including washing, rinsing, drying)?: A Lot Help from another person to put on and taking off regular upper body clothing?: A Little Help from another person to put on and taking off regular lower body clothing?: A Lot 6 Click Score: 14    End of Session    OT Visit Diagnosis: Unsteadiness on feet (R26.81);Other abnormalities of gait and mobility (R26.89);Muscle weakness (generalized) (M62.81);Other symptoms and signs involving cognitive function   Activity Tolerance Other (comment) (bp)   Patient Left in bed;with call bell/phone within reach;with nursing/sitter in room   Nurse Communication Mobility status        Time: 1610-9604 OT Time Calculation (min): 50 min  Charges: OT General Charges $OT Visit: 1 Visit OT Treatments $Self Care/Home Management : 38-52 mins  Erving Heather OTR/L  Acute Rehab Services  [phone] office number   Stevphen Elders 12/21/2023, 10:35 AM

## 2023-12-21 NOTE — Progress Notes (Signed)
 PROGRESS NOTE        PATIENT DETAILS Name: Tony Cantu Age: 73 y.o. Sex: male Date of Birth: March 18, 1951 Admit Date: 12/08/2023 Admitting Physician Johnetta Nab, MD WUJ:[id number], Gwendalyn Lemma, DO  Brief Summary: Patient is a 73 y.o.  male with history of [name] cirrhosis, PAF on anticoagulation who presented with somnolence/fatigue-was found to have hepatic encephalopathy along with upper GI bleeding.  Hospital course complicated by orthostatic hypotension.  Significant events: 4/11>> admit to [facility]  Significant studies: 4/11>> CT abdomen/pelvis: Mild circumferential thickening of the rectum-favoring proctitis. 4/11>> CT head: No acute intracranial abnormality.  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  Consults: GI  Subjective: No major issues overnight-had a brief episode of hypotension while sleeping-he was asymptomatic.  Responded to some IV fluids.  Objective: Vitals: Blood pressure 90/72, pulse 63, temperature 97.6 F (36.4 C), resp. rate 16, height 5' 7.99" (1.727 m), weight 75.1 kg, SpO2 94%.   Exam: Gen Exam:Alert awake-not in any distress HEENT:atraumatic, normocephalic Chest: B/L clear to auscultation anteriorly CVS:S1S2 regular Abdomen:soft non tender, non distended Extremities:no edema Neurology: Non focal Skin: no rash  Pertinent Labs/Radiology:    Latest Ref Rng & Units 12/20/2023    3:44 AM 12/18/2023    1:31 AM 12/17/2023    6:28 AM  CBC  WBC 4.0 - 10.5 K/uL 5.6  4.5  4.9   Hemoglobin 13.0 - 17.0 g/dL 56.2  13.0  86.5   Hematocrit 39.0 - 52.0 % 34.3  31.5  31.7   Platelets 150 - 400 K/uL 116  80  73     Lab Results  Component Value Date   NA 135 12/17/2023   K 3.8 12/17/2023   CL 105 12/17/2023   CO2 22 12/17/2023       Assessment/Plan: Hepatic encephalopathy Likely triggered by upper GI bleeding Resolved with lactulose /Xifaxan  Completely awake/alert this morning  Upper GI bleeding Secondary to AVMs As/P EGD with APC 1/16 No further bleeding-Hb stable Follow CBC Continue PPI twice daily  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  Chronic HFpEF Euvolemic on exam Diuretics as needed  PAF Telemetry monitoring Amiodarone  Eliquis .  HLD Continue to hold statins-trend LFTs.  Transaminitis Unclear etiology Downtrending Follow-remains on amiodarone -hence watch closely.  COPD/asthma Stable Bronchodilators  DM-2 (A1c 4.4 on 1/21) CBGs on the higher side this morning-had hypoglycemia yesterday Allow permissive hyperglycemia Semglee  12 units twice daily+ SSI  Debility/deconditioning Due to acute illness PT/OT eval-SNF recommended.  Code status:   Code Status: Limited: Do not attempt resuscitation (DNR) -DNR-LIMITED -Do Not Intubate/DNI    DVT Prophylaxis: Place TED hose Start: 12/14/23 1312 Place and maintain sequential compression device Start: 12/09/23 0555 SCDs Start: 12/08/23 1707 apixaban  (ELIQUIS ) tablet 5 mg    Family Communication: None at bedside   Disposition Plan: Status is: Inpatient Remains inpatient appropriate because: Severity of illness   Planned Discharge Destination:Skilled nursing facility   Diet: Diet Order             Diet - low sodium heart healthy           Diet Carb  Modified           Diet heart healthy/carb modified Room service appropriate? Yes; Fluid consistency: Thin  Diet effective now                     Antimicrobial agents: Anti-infectives (From admission, onward)    Start     Dose/Rate Route Frequency Ordered Stop    12/21/23 0000  rifaximin  (XIFAXAN ) 550 MG TABS tablet        550 mg Oral 2 times daily 12/21/23 0826     12/10/23 2200  rifaximin  (XIFAXAN ) tablet 550 mg        550 mg Oral 2 times daily 12/10/23 1815     12/09/23 1000  rifaximin  (XIFAXAN ) tablet 550 mg  Status:  Discontinued        550 mg Per Tube 2 times daily 12/09/23 0827 12/10/23 1815        MEDICATIONS: Scheduled Meds:  amiodarone   100 mg Oral Daily   apixaban   5 mg Oral BID   Gerhardt's butt cream   Topical TID   insulin  aspart  0-5 Units Subcutaneous QHS   insulin  aspart  0-9 Units Subcutaneous TID WC   insulin  glargine-yfgn  12 Units Subcutaneous BID   lactulose   30 g Oral TID   midodrine   10 mg Oral TID WC   pantoprazole   40 mg Oral BID AC   rifaximin   550 mg Oral BID   sodium chloride  flush  3 mL Intravenous Q12H   Continuous Infusions: PRN Meds:.acetaminophen  **OR** acetaminophen , diltiazem , melatonin   I have personally reviewed following labs and imaging studies  LABORATORY DATA: CBC: Recent Labs  Lab 12/16/23 0828 12/17/23 0628 12/18/23 0131 12/20/23 0344  WBC 4.8 4.9 4.5 5.6  HGB 9.4* 10.4* 10.5* 11.2*  HCT 29.1* 31.7* 31.5* 34.3*  MCV 108.6* 107.5* 106.4* 105.2*  PLT 69* 73* 80* 116*    Basic Metabolic Panel: Recent Labs  Lab 12/15/23 0459 12/16/23 0527 12/17/23 0628  NA 140 140 135  K 3.6 4.1 3.8  CL 106 109 105  CO2 25 25 22   GLUCOSE 99 169* 203*  BUN 6* <5* <5*  CREATININE 0.73 0.68 0.63  CALCIUM  9.4 9.6 9.3    GFR: Estimated Creatinine Clearance: 78.4 mL/min (by C-G formula based on SCr of 0.63 mg/dL).  Liver Function Tests: Recent Labs  Lab 12/15/23 0459 12/16/23 0527 12/17/23 0628  AST 168* 83* 70*  ALT 134* 92* 78*  ALKPHOS 151* 132* 153*  BILITOT 1.2 1.4* 1.0  PROT 5.3* 5.6* 5.4*  ALBUMIN  2.8* 3.4* 2.9*   No results for input(s): "LIPASE", "AMYLASE" in the last 168 hours. Recent Labs  Lab 12/15/23 0459 12/18/23 0131  AMMONIA 20 44*    Coagulation  Profile: No results for input(s): "INR", "PROTIME" in the last 168 hours.  Cardiac Enzymes: No results for input(s): "CKTOTAL", "CKMB", "CKMBINDEX", "TROPONINI" in the last 168 hours.  BNP (last 3 results) No results for input(s): "PROBNP" in the last 8760 hours.  Lipid Profile: No results for input(s): "CHOL", "HDL", "LDLCALC", "TRIG", "CHOLHDL", "LDLDIRECT" in the last 72 hours.  Thyroid  Function Tests: No results for input(s): "TSH", "T4TOTAL", "FREET4", "T3FREE", "THYROIDAB" in the last 72 hours.  Anemia Panel: No results for input(s): "VITAMINB12", "FOLATE", "FERRITIN", "TIBC", "IRON ", "RETICCTPCT" in the last 72 hours.  Urine analysis:    Component Value Date/Time   COLORURINE YELLOW 12/08/2023 1733   APPEARANCEUR HAZY (A) 12/08/2023 1733   APPEARANCEUR Clear 07/05/2021 1129  LABSPEC 1.019 12/08/2023 1733   PHURINE 7.0 12/08/2023 1733   GLUCOSEU NEGATIVE 12/08/2023 1733   GLUCOSEU NEGATIVE 04/22/2021 1048   HGBUR NEGATIVE 12/08/2023 1733   BILIRUBINUR NEGATIVE 12/08/2023 1733   BILIRUBINUR Negative 07/05/2021 1129   KETONESUR NEGATIVE 12/08/2023 1733   PROTEINUR NEGATIVE 12/08/2023 1733   UROBILINOGEN 0.2 04/22/2021 1048   NITRITE NEGATIVE 12/08/2023 1733   LEUKOCYTESUR NEGATIVE 12/08/2023 1733    Sepsis Labs: Lactic Acid, Venous    Component Value Date/Time   LATICACIDVEN 1.6 12/08/2023 1406    MICROBIOLOGY: No results found for this or any previous visit (from the past 240 hours).  RADIOLOGY STUDIES/RESULTS: No results found.   LOS: 13 days   Kimberly Penna, MD  [facility]    To contact the attending provider between 7A-7P or the covering provider during after hours 7P-7A, please log into the web site [email or web address] and access using universal [place] password for that web site. If you do not have the password, please call the hospital operator.  12/21/2023, 10:52 AM

## 2023-12-21 NOTE — TOC Progression Note (Signed)
 Transition of Care [facility]) - Progression Note    Patient Details  Name: Tony Cantu MRN: [id number] Date of Birth: 26-Mar-1951  [facility]) CM/SW Contact  Jannice Mends, LCSW Phone Number: 12/21/2023, 12:01 PM  Clinical Narrative:    CSW placed MD signed letter stating patient has capacity in his room. Updated [name] who will come by to pick it up.    Expected Discharge Plan: Skilled Nursing Facility Barriers to Discharge: Continued Medical Work up, English as a second language teacher  Expected Discharge Plan and Services In-house Referral: Clinical Social Work   Post Acute Care Choice: Skilled Nursing Facility Living arrangements for the past 2 months: Single Family Home, Skilled Nursing Facility Expected Discharge Date: 12/21/23                                     Social Determinants of Health (SDOH) Interventions SDOH Screenings   Food Insecurity: Patient Unable To Answer (12/09/2023)  Housing: Patient Unable To Answer (12/09/2023)  Recent Concern: Housing - High Risk (10/30/2023)  Transportation Needs: Patient Unable To Answer (12/09/2023)  Utilities: Patient Unable To Answer (12/09/2023)  Depression (PHQ2-9): Medium Risk (09/13/2023)  Financial Resource Strain: Low Risk  (06/09/2022)  Social Connections: Patient Unable To Answer (12/09/2023)  Recent Concern: Social Connections - Socially Isolated (10/30/2023)  Tobacco Use: Medium Risk (12/08/2023)    Readmission Risk Interventions     No data to display

## 2023-12-22 DIAGNOSIS — K922 Gastrointestinal hemorrhage, unspecified: Secondary | ICD-10-CM | POA: Diagnosis not present

## 2023-12-22 DIAGNOSIS — J449 Chronic obstructive pulmonary disease, unspecified: Secondary | ICD-10-CM | POA: Diagnosis not present

## 2023-12-22 DIAGNOSIS — K7682 Hepatic encephalopathy: Secondary | ICD-10-CM | POA: Diagnosis not present

## 2023-12-22 DIAGNOSIS — I480 Paroxysmal atrial fibrillation: Secondary | ICD-10-CM | POA: Diagnosis not present

## 2023-12-22 LAB — GLUCOSE, CAPILLARY
Glucose-Capillary: 324 mg/dL — ABNORMAL HIGH (ref 70–99)
Glucose-Capillary: 356 mg/dL — ABNORMAL HIGH (ref 70–99)
Glucose-Capillary: 260 mg/dL — ABNORMAL HIGH (ref 70–99)
Glucose-Capillary: 316 mg/dL — ABNORMAL HIGH (ref 70–99)

## 2023-12-22 MED ORDER — LANTUS SOLOSTAR 100 UNIT/ML ~~LOC~~ SOPN: 15.0000 [IU] | PEN_INJECTOR | Freq: Two times a day (BID) | SUBCUTANEOUS | Status: DC

## 2023-12-22 MED ORDER — INSULIN ASPART 100 UNIT/ML IJ SOLN
4.0000 [IU] | Freq: Three times a day (TID) | INTRAMUSCULAR | Status: DC
  Administered 2023-12-22 (×2): 4 [IU] via SUBCUTANEOUS

## 2023-12-22 MED ORDER — INSULIN GLARGINE-YFGN 100 UNIT/ML ~~LOC~~ SOLN
15.0000 [IU] | Freq: Two times a day (BID) | SUBCUTANEOUS | Status: DC
  Filled 2023-12-22: qty 0.15

## 2023-12-22 MED ORDER — INSULIN GLARGINE-YFGN 100 UNIT/ML ~~LOC~~ SOLN
15.0000 [IU] | Freq: Two times a day (BID) | SUBCUTANEOUS | Status: DC
  Administered 2023-12-22 (×2): 15 [IU] via SUBCUTANEOUS
  Filled 2023-12-22 (×4): qty 0.15

## 2023-12-22 NOTE — Plan of Care (Signed)

## 2023-12-22 NOTE — Progress Notes (Addendum)
 Initial Nutrition Assessment  DOCUMENTATION CODES:  Severe malnutrition in context of chronic illness  INTERVENTION:  Glucerna Shake po TID, each supplement provides 220 kcal and 10 grams of protein MVI w/ minerals Liberalize diet to encourage PO intake Collect new weight to assess trend   NUTRITION DIAGNOSIS:  Severe Malnutrition related to chronic illness (NASH cirrhosis, T1DM, HTN) as evidenced by severe muscle depletion, severe fat depletion, percent weight loss.  GOAL:  Patient will meet greater than or equal to 90% of their needs  MONITOR:  PO intake, Supplement acceptance, Weight trends  REASON FOR ASSESSMENT:  Consult Assessment of nutrition requirement/status  ASSESSMENT:   Pt with PMH significant for NASH cirrhosis, PAF, GERD, T1DM, HTN, arthritis who presented with somnolence/fatigue and was found to have hepatic encephalopathy along with upper GIB. Hospitalization c/b orthostatic hypotension.   4/11 admitted 4/11 CT abd/pelvis: mild circumferential thickening of rectum favoring proctitis 4/16 EGD: portal hypertensive gastropathy, single AVM-s/p APC, GAVE-s/p APC, 2 bleeding angioectasias in duodenum  Pt is medically stable for discharge. Awaiting bed offer. He is pleasant and conversant at bedside this morning. No major issues overnight.  Average Meal Intake 4/16: 100% x1 documented meal 4/23: 50% x2 documented meals  He is questionable historian. Endorses poor appetite for over one year, however says he's also been here that long. Unable to report what a usual day of intake looks like. No documented or reported issues with chewing or swallowing. No N/V/C/D. Will recommend liberalizing diet to encourage PO intake. Glucerna to continue. He does prefer any flavor other than chocolate. Recommend MVI w/ minerals for nutrition support. He also is requesting BLT for dinner as he was unable to order earlier today.  24 Hour Recall Unable to recall  As with diet recall,  he is unable to endorse UBW. States he used to be around 210lbs, however unable to report time frame of weight loss. Per chart review, shows 21% weight loss in three months and 22% in six months, both of which are considered clinically significant for their respective time frames.  Admit/Current Weight: 75.1kg 3 Months Ago: 95.2kg 6 Months Ago: 96.6kg  Drains/Lines: Urinary catheter UOP: 1.5L x24 hours  Permissive hyperglycemia, per MD. Increased Semglee  and added Novolog  w/ meals. Monitoring closely to prevent hypoglycemia.   Meds: SSI 0-5 at bedtime, SSI 0-9 TID, SSI 4 TID, Semglee  15 BID, lactulose , pantoprazole ,   Labs:  No new labs to review   NUTRITION - FOCUSED PHYSICAL EXAM:  Flowsheet Row Most Recent Value  Orbital Region Moderate depletion  Upper Arm Region Moderate depletion  Thoracic and Lumbar Region Severe depletion  Buccal Region Moderate depletion  Temple Region Severe depletion  Clavicle Bone Region Severe depletion  Clavicle and Acromion Bone Region Severe depletion  Scapular Bone Region Severe depletion  Dorsal Hand Mild depletion  Patellar Region Mild depletion  Anterior Thigh Region Mild depletion  Posterior Calf Region Moderate depletion  Edema (RD Assessment) None  Hair Reviewed  Eyes Reviewed  Mouth Reviewed  Skin Reviewed  Nails Reviewed    Diet Order:   Diet Order             Diet - low sodium heart healthy           Diet Carb Modified           Diet heart healthy/carb modified Room service appropriate? Yes; Fluid consistency: Thin  Diet effective now             EDUCATION NEEDS:  Not appropriate for education at this time  Skin:  Skin Assessment: Skin Integrity Issues: Skin Integrity Issues:: Other (Comment) Other: MASD to mid sacrum  Last BM:  4/24 - type 6 x2  Height:  Ht Readings from Last 1 Encounters:  12/08/23 5' 7.99" (1.727 m)   Weight:  Wt Readings from Last 1 Encounters:  12/09/23 75.1 kg   Ideal Body Weight:   70 kg  BMI:  Body mass index is 25.18 kg/m.  Estimated Nutritional Needs:   Kcal:  1800-2000kcal  Protein:  85-100g  Fluid:  >1.8L/day  Con Decant MS, RD, LDN Registered Dietitian Clinical Nutrition RD Inpatient Contact Info in Amion

## 2023-12-22 NOTE — Progress Notes (Signed)
 Physical Therapy Treatment Patient Details Name: Tony Cantu MRN: [id number] DOB: 12-Oct-1950 Today's Date: 12/22/2023   History of Present Illness Patient is 73 yo male admitted on 12/08/23 for AMS, nausea, vomiting, and dark stool. PMHx significant for type 1 diabetes, A-fib on Eliquis , seizure disorder, aortic stenosis, HTN, peptic ulcer disease, OA with bilateral knee replacement, polyclonal gammopathy, OSA on CPAP, GERD, COPD/asthma, chronic diastolic CHF, cirrhosis, portal hypertension, and BPH.    PT Comments  Remains limited by symptomatic hypotension immediately upon sitting EOB (see details below.) Still confused, claiming he has been in this hospital for 1 year now and that he has been ambulatory with therapies this week (neither of which are accurate.)  After returning to supine and symptoms improving, pt tolerated LE exercise well. Required peri-care due to bowel incontinence. Focused on teaching increased independence with rolling and assisting with hygiene. Patient will continue to benefit from skilled physical therapy services to further improve independence with functional mobility.   Supine BP prior to activity 106/63 (MAP 77); Sitting EOB 74/51 (MAP 58); Returned to supine 96/49 (MAP 63). Supine after 5 minutes 93/55 (MAP 66) RN notified.    If plan is discharge home, recommend the following: Two people to help with walking and/or transfers;A lot of help with bathing/dressing/bathroom;Assistance with cooking/housework;Direct supervision/assist for medications management;Direct supervision/assist for financial management;Assist for transportation;Supervision due to cognitive status   Can travel by private vehicle     No  Equipment Recommendations  None recommended by PT (TBD)    Recommendations for Other Services       Precautions / Restrictions Precautions Precautions: Fall Recall of Precautions/Restrictions: Impaired Precaution/Restrictions Comments: watch BP;  Frequent loose stools Restrictions Weight Bearing Restrictions Per Provider Order: No     Mobility  Bed Mobility Overal bed mobility: Needs Assistance Bed Mobility: Rolling Rolling: Min assist, Mod assist, Used rails Sidelying to sit: Mod assist, Used rails, HOB elevated     Sit to sidelying: Min assist General bed mobility comments: Min assist to roll Rt, mod assist to achieve full Lt sidelying and to rise to EOB (immediately dizzy with corresponding drop in BP.) Min assist to lower back to sidelying and brings LEs into bed with very little help.    Transfers                   General transfer comment: Deferred due to symptomatic hypotension.    Ambulation/Gait                   Stairs             Wheelchair Mobility     Tilt Bed    Modified Rankin (Stroke Patients Only)       Balance Overall balance assessment: Needs assistance Sitting-balance support: Feet supported, Bilateral upper extremity supported Sitting balance-Leahy Scale: Poor                                      Communication Communication Communication: No apparent difficulties  Cognition Arousal: Alert Behavior During Therapy: WFL for tasks assessed/performed   PT - Cognitive impairments: No family/caregiver present to determine baseline, Attention, Sequencing, Problem solving, Safety/Judgement, Awareness, Orientation   Orientation impairments: Situation                   PT - Cognition Comments: States he has been in this hospital for 1 year. States he has  been ambulatory with staff recently - neither  are accurate claims. Following commands: Impaired Following commands impaired: Only follows one step commands consistently    Cueing Cueing Techniques: Verbal cues, Tactile cues, Gestural cues  Exercises General Exercises - Lower Extremity Ankle Circles/Pumps: AROM, Both, 10 reps, Supine Quad Sets: Strengthening, Both, 10 reps, Supine Gluteal  Sets: Strengthening, Both, 10 reps, Supine Heel Slides: Strengthening, Both, 5 reps, Supine Hip ABduction/ADduction: Strengthening, Both, 10 reps, Supine    General Comments General comments (skin integrity, edema, etc.): Supine BP prior to activity 106/63 (MAP 77); Sitting EOB 74/51 (MAP 58); Returned to supine 96/49 (MAP 63). Supine after 5 minutes 93/55 (MAP 66) RN notified.      Pertinent Vitals/Pain Pain Assessment Pain Assessment: Faces Faces Pain Scale: Hurts little more Pain Location: butticks Pain Descriptors / Indicators: Grimacing, Guarding Pain Intervention(s): Monitored during session, Repositioned    Home Living                          Prior Function            PT Goals (current goals can now be found in the care plan section) Acute Rehab PT Goals Patient Stated Goal: Go to rehab in [place] PT Goal Formulation: Patient unable to participate in goal setting Time For Goal Achievement: 01/05/24 Potential to Achieve Goals: Fair Progress towards PT goals: Progressing toward goals    Frequency    Min 1X/week      PT Plan      Co-evaluation              AM-PAC PT "6 Clicks" Mobility   Outcome Measure  Help needed turning from your back to your side while in a flat bed without using bedrails?: A Little Help needed moving from lying on your back to sitting on the side of a flat bed without using bedrails?: A Lot Help needed moving to and from a bed to a chair (including a wheelchair)?: Total Help needed standing up from a chair using your arms (e.g., wheelchair or bedside chair)?: Total Help needed to walk in hospital room?: Total Help needed climbing 3-5 steps with a railing? : Total 6 Click Score: 9    End of Session   Activity Tolerance: Treatment limited secondary to medical complications (Comment) (low BP/dizzy) Patient left: in bed;with call bell/phone within reach;with bed alarm set (Flat for BP - RN notified. Prevalons donned) Nurse  Communication: Mobility status (BP; stool incontinence, cleaned.) PT Visit Diagnosis: Muscle weakness (generalized) (M62.81);Difficulty in walking, not elsewhere classified (R26.2);Other symptoms and signs involving the nervous system (R29.898);Dizziness and giddiness (R42) Pain - part of body:  (buttocks)     Time: 1450-1516 PT Time Calculation (min) (ACUTE ONLY): 26 min  Charges:    $Therapeutic Exercise: 8-22 mins $Therapeutic Activity: 8-22 mins PT General Charges $$ ACUTE PT VISIT: 1 Visit                     Jory Ng, PT, DPT [facility]  Rehabilitation Services Physical Therapist Office: [phone] Website: [email or web address]    Alinda Irani 12/22/2023, 3:50 PM

## 2023-12-22 NOTE — Progress Notes (Signed)
 PROGRESS NOTE        PATIENT DETAILS Name: Tony Cantu Age: 73 y.o. Sex: male Date of Birth: [birth year] Admit Date: 12/08/2023 Admitting Physician Johnetta Nab, MD UJW:[id number], Gwendalyn Lemma, DO  Brief Summary: Patient is a 72 y.o.  male with history of [name] cirrhosis, PAF on anticoagulation who presented with somnolence/fatigue-was found to have hepatic encephalopathy along with upper GI bleeding.  Hospital course complicated by orthostatic hypotension.  Significant events: 4/11>> admit to [facility]  Significant studies: 4/11>> CT abdomen/pelvis: Mild circumferential thickening of the rectum-favoring proctitis. 4/11>> CT head: No acute intracranial abnormality.  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  Consults: GI  Subjective: No major issues overnight-awaiting SNF bed.  Lying flat-had 2-3 BMs yesterday.  No nausea, vomiting or diarrhea.  Objective: Vitals: Blood pressure (!) 105/57, pulse (!) 57, temperature 98 F (36.7 C), temperature source Oral, resp. rate 18, height 5' 7.99" (1.727 m), weight 75.1 kg, SpO2 91%.   Exam: Gen Exam:Alert awake-not in any distress HEENT:atraumatic, normocephalic Chest: B/L clear to auscultation anteriorly CVS:S1S2 regular Abdomen:soft non tender, non distended Extremities:no edema Neurology: Non focal Skin: no rash  Pertinent Labs/Radiology:    Latest Ref Rng & Units 12/20/2023    3:44 AM 12/18/2023    1:31 AM 12/17/2023    6:28 AM  CBC  WBC 4.0 - 10.5 K/uL 5.6  4.5  4.9   Hemoglobin 13.0 - 17.0 g/dL 62.1  30.8  65.7   Hematocrit 39.0 - 52.0 % 34.3  31.5  31.7   Platelets 150 - 400 K/uL 116  80  73     Lab Results  Component Value Date   NA 135 12/17/2023   K 3.8 12/17/2023   CL 105 12/17/2023   CO2 22 12/17/2023       Assessment/Plan: Hepatic encephalopathy Likely triggered by upper GI bleeding Resolved with lactulose /Xifaxan  Completely awake/alert this morning  Upper 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  Chronic HFpEF Euvolemic on exam Diuretics as needed  PAF Telemetry monitoring Amiodarone  Eliquis .  HLD Continue to hold statins-trend LFTs.  Transaminitis Unclear etiology Downtrending Follow-remains on amiodarone -hence watch closely.  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  Recent Labs    12/21/23 1648 12/21/23 1957 12/22/23 0838  GLUCAP 209* 185* 324*     Debility/deconditioning Due to acute illness PT/OT eval-SNF recommended.  Code status:   Code Status: Limited: Do not attempt resuscitation (DNR) -DNR-LIMITED -Do Not Intubate/DNI    DVT Prophylaxis: Place TED hose Start: 12/14/23 1312 Place and maintain sequential compression device Start: 12/09/23 0555 SCDs Start: 12/08/23 1707 apixaban  (ELIQUIS ) tablet 5 mg    Family Communication: None at bedside   Disposition Plan: Status is: Inpatient Remains inpatient appropriate because: Severity of illness   Planned Discharge Destination:Skilled nursing  facility   Diet: Diet Order             Diet - low sodium heart healthy           Diet Carb Modified           Diet heart healthy/carb modified Room service appropriate? Yes; Fluid consistency:  Thin  Diet effective now                     Antimicrobial agents: Anti-infectives (From admission, onward)    Start     Dose/Rate Route Frequency Ordered Stop   12/21/23 0000  rifaximin  (XIFAXAN ) 550 MG TABS tablet        550 mg Oral 2 times daily 12/21/23 0826     12/10/23 2200  rifaximin  (XIFAXAN ) tablet 550 mg        550 mg Oral 2 times daily 12/10/23 1815     12/09/23 1000  rifaximin  (XIFAXAN ) tablet 550 mg  Status:  Discontinued        550 mg Per Tube 2 times daily 12/09/23 0827 12/10/23 1815        MEDICATIONS: Scheduled Meds:  amiodarone   100 mg Oral Daily   apixaban   5 mg Oral BID   feeding supplement (GLUCERNA SHAKE)  237 mL Oral TID BM   Gerhardt's butt cream   Topical TID   insulin  aspart  0-5 Units Subcutaneous QHS   insulin  aspart  0-9 Units Subcutaneous TID WC   insulin  glargine-yfgn  12 Units Subcutaneous BID   lactulose   30 g Oral TID   midodrine   10 mg Oral TID WC   pantoprazole   40 mg Oral BID AC   rifaximin   550 mg Oral BID   sodium chloride  flush  3 mL Intravenous Q12H   Continuous Infusions:  albumin  human 25 g (12/22/23 0614)   PRN Meds:.acetaminophen  **OR** acetaminophen , diltiazem , melatonin   I have personally reviewed following labs and imaging studies  LABORATORY DATA: CBC: Recent Labs  Lab 12/16/23 0828 12/17/23 0628 12/18/23 0131 12/20/23 0344  WBC 4.8 4.9 4.5 5.6  HGB 9.4* 10.4* 10.5* 11.2*  HCT 29.1* 31.7* 31.5* 34.3*  MCV 108.6* 107.5* 106.4* 105.2*  PLT 69* 73* 80* 116*    Basic Metabolic Panel: Recent Labs  Lab 12/16/23 0527 12/17/23 0628  NA 140 135  K 4.1 3.8  CL 109 105  CO2 25 22  GLUCOSE 169* 203*  BUN <5* <5*  CREATININE 0.68 0.63  CALCIUM  9.6 9.3    GFR: Estimated Creatinine Clearance: 78.4 mL/min (by C-G formula based on SCr of 0.63 mg/dL).  Liver Function Tests: Recent Labs  Lab 12/16/23 0527 12/17/23 0628  AST 83* 70*  ALT 92* 78*  ALKPHOS 132* 153*  BILITOT 1.4* 1.0  PROT 5.6* 5.4*   ALBUMIN  3.4* 2.9*   No results for input(s): "LIPASE", "AMYLASE" in the last 168 hours. Recent Labs  Lab 12/18/23 0131  AMMONIA 44*    Coagulation Profile: No results for input(s): "INR", "PROTIME" in the last 168 hours.  Cardiac Enzymes: No results for input(s): "CKTOTAL", "CKMB", "CKMBINDEX", "TROPONINI" in the last 168 hours.  BNP (last 3 results) No results for input(s): "PROBNP" in the last 8760 hours.  Lipid Profile: No results for input(s): "CHOL", "HDL", "LDLCALC", "TRIG", "CHOLHDL", "LDLDIRECT" in the last 72 hours.  Thyroid  Function Tests: No results for input(s): "TSH", "T4TOTAL", "FREET4", "T3FREE", "THYROIDAB" in the last 72 hours.  Anemia Panel: No results for input(s): "VITAMINB12", "FOLATE", "FERRITIN", "TIBC", "  IRON ", "RETICCTPCT" in the last 72 hours.  Urine analysis:    Component Value Date/Time   COLORURINE YELLOW 12/08/2023 1733   APPEARANCEUR HAZY (A) 12/08/2023 1733   APPEARANCEUR Clear 07/05/2021 1129   LABSPEC 1.019 12/08/2023 1733   PHURINE 7.0 12/08/2023 1733   GLUCOSEU NEGATIVE 12/08/2023 1733   GLUCOSEU NEGATIVE 04/22/2021 1048   HGBUR NEGATIVE 12/08/2023 1733   BILIRUBINUR NEGATIVE 12/08/2023 1733   BILIRUBINUR Negative 07/05/2021 1129   KETONESUR NEGATIVE 12/08/2023 1733   PROTEINUR NEGATIVE 12/08/2023 1733   UROBILINOGEN 0.2 04/22/2021 1048   NITRITE NEGATIVE 12/08/2023 1733   LEUKOCYTESUR NEGATIVE 12/08/2023 1733    Sepsis Labs: Lactic Acid, Venous    Component Value Date/Time   LATICACIDVEN 1.6 12/08/2023 1406    MICROBIOLOGY: No results found for this or any previous visit (from the past 240 hours).  RADIOLOGY STUDIES/RESULTS: No results found.   LOS: 14 days   Kimberly Penna, MD  [facility]    To contact the attending provider between 7A-7P or the covering provider during after hours 7P-7A, please log into the web site [email or web address] and access using universal  password for that web site. If  you do not have the password, please call the hospital operator.  12/22/2023, 8:50 AM

## 2023-12-23 DIAGNOSIS — I480 Paroxysmal atrial fibrillation: Secondary | ICD-10-CM | POA: Diagnosis not present

## 2023-12-23 DIAGNOSIS — J449 Chronic obstructive pulmonary disease, unspecified: Secondary | ICD-10-CM | POA: Diagnosis not present

## 2023-12-23 DIAGNOSIS — K7682 Hepatic encephalopathy: Secondary | ICD-10-CM | POA: Diagnosis not present

## 2023-12-23 DIAGNOSIS — K922 Gastrointestinal hemorrhage, unspecified: Secondary | ICD-10-CM | POA: Diagnosis not present

## 2023-12-23 LAB — GLUCOSE, CAPILLARY
Glucose-Capillary: 278 mg/dL — ABNORMAL HIGH (ref 70–99)
Glucose-Capillary: 308 mg/dL — ABNORMAL HIGH (ref 70–99)
Glucose-Capillary: 261 mg/dL — ABNORMAL HIGH (ref 70–99)
Glucose-Capillary: 362 mg/dL — ABNORMAL HIGH (ref 70–99)
Glucose-Capillary: 229 mg/dL — ABNORMAL HIGH (ref 70–99)

## 2023-12-23 MED ORDER — LANTUS SOLOSTAR 100 UNIT/ML ~~LOC~~ SOPN: 18.0000 [IU] | PEN_INJECTOR | Freq: Two times a day (BID) | SUBCUTANEOUS | Status: DC

## 2023-12-23 MED ORDER — INSULIN ASPART 100 UNIT/ML IJ SOLN
6.0000 [IU] | Freq: Three times a day (TID) | INTRAMUSCULAR | Status: DC
  Administered 2023-12-23 (×2): 6 [IU] via SUBCUTANEOUS

## 2023-12-23 MED ORDER — INSULIN LISPRO 100 UNIT/ML IJ SOLN
6.0000 [IU] | Freq: Three times a day (TID) | INTRAMUSCULAR | Status: DC
Start: 2023-12-23 — End: 2024-01-11

## 2023-12-23 MED ORDER — INSULIN GLARGINE-YFGN 100 UNIT/ML ~~LOC~~ SOLN
18.0000 [IU] | Freq: Two times a day (BID) | SUBCUTANEOUS | Status: DC
  Administered 2023-12-23 – 2023-12-24 (×3): 18 [IU] via SUBCUTANEOUS
  Filled 2023-12-23 (×4): qty 0.18

## 2023-12-23 NOTE — Plan of Care (Signed)

## 2023-12-23 NOTE — Progress Notes (Signed)
 PROGRESS NOTE        PATIENT DETAILS Name: Tony Cantu Age: 73 y.o. Sex: male Date of Birth: March 27, 1951 Admit Date: 12/08/2023 Admitting Physician Johnetta Nab, MD ZOX:[id number], Gwendalyn Lemma, DO  Brief Summary: Patient is a 73 y.o.  male with history of [name] cirrhosis, PAF on anticoagulation who presented with somnolence/fatigue-was found to have hepatic encephalopathy along with upper GI bleeding.  Hospital course complicated by orthostatic hypotension.  Significant events: 4/11>> admit to [facility]  Significant studies: 4/11>> CT abdomen/pelvis: Mild circumferential thickening of the rectum-favoring proctitis. 4/11>> CT head: No acute intracranial abnormality.  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  Consults: GI  Subjective: Frustrated that he is still in the hospital-lying comfortably in bed.  No chest pain or shortness of breath.  Had approximately 2 BMs yesterday.  Objective: Vitals: Blood pressure 93/60, pulse (!) 56, temperature 97.8 F (36.6 C), temperature source Oral, resp. rate 16, height 5' 7.99" (1.727 m), weight 74.8 kg, SpO2 94%.   Exam: Gen Exam:Alert awake-not in any distress HEENT:atraumatic, normocephalic Chest: B/L clear to auscultation anteriorly CVS:S1S2 regular Abdomen:soft non tender, non distended Extremities:no edema Neurology: Non focal Skin: no rash  Pertinent Labs/Radiology:    Latest Ref Rng & Units 12/20/2023    3:44 AM 12/18/2023    1:31 AM 12/17/2023    6:28 AM  CBC  WBC 4.0 - 10.5 K/uL 5.6  4.5  4.9   Hemoglobin 13.0 - 17.0 g/dL 11.9  14.7  82.9   Hematocrit 39.0 - 52.0 % 34.3  31.5  31.7   Platelets 150 - 400 K/uL 116  80  73     Lab Results  Component Value Date   NA 135 12/17/2023   K 3.8 12/17/2023   CL 105  12/17/2023   CO2 22 12/17/2023      Assessment/Plan: Hepatic encephalopathy Likely triggered by upper GI bleeding Resolved with lactulose /Xifaxan  Completely awake/alert this morning  Upper 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  Chronic HFpEF Euvolemic on exam Diuretics as needed  PAF Telemetry monitoring Amiodarone  Eliquis .  HLD Continue to hold statins-trend LFTs.  Transaminitis Unclear etiology Downtrending Follow-remains on amiodarone -hence watch closely.  COPD/asthma Stable Bronchodilators  DM-2 (A1c 4.4 on 1/21) CBGs remain on the higher side this morning-no other episodes of hypoglycemia Allow permissive hyperglycemia Increase Semglee  to 18 units twice daily, increase Premeal NovoLog  to 6 units-continue SSI.    Recent Labs    12/22/23 1636 12/22/23 2000 12/23/23 0809  GLUCAP 260* 316* 278*     Debility/deconditioning Due to acute illness PT/OT eval-SNF recommended.  Code status:   Code Status: Limited: Do not attempt resuscitation (DNR) -DNR-LIMITED -Do Not Intubate/DNI    DVT Prophylaxis: Place TED hose Start: 12/14/23 1312 Place and maintain sequential compression device Start: 12/09/23 0555 SCDs Start: 12/08/23 1707 apixaban  (ELIQUIS ) tablet 5 mg    Family Communication: None at bedside   Disposition Plan: Status is: Inpatient Remains inpatient appropriate because: Severity of illness  Planned Discharge Destination:Skilled nursing facility   Diet: Diet Order             Diet regular Room service appropriate? Yes; Fluid consistency: Thin  Diet effective now           Diet - low sodium heart healthy            Diet Carb Modified                     Antimicrobial agents: Anti-infectives (From admission, onward)    Start     Dose/Rate Route Frequency Ordered Stop   12/21/23 0000  rifaximin  (XIFAXAN ) 550 MG TABS tablet        550 mg Oral 2 times daily 12/21/23 0826     12/10/23 2200  rifaximin  (XIFAXAN ) tablet 550 mg        550 mg Oral 2 times daily 12/10/23 1815     12/09/23 1000  rifaximin  (XIFAXAN ) tablet 550 mg  Status:  Discontinued        550 mg Per Tube 2 times daily 12/09/23 0827 12/10/23 1815        MEDICATIONS: Scheduled Meds:  amiodarone   100 mg Oral Daily   apixaban   5 mg Oral BID   feeding supplement (GLUCERNA SHAKE)  237 mL Oral TID BM   Gerhardt's butt cream   Topical TID   insulin  aspart  0-5 Units Subcutaneous QHS   insulin  aspart  0-9 Units Subcutaneous TID WC   insulin  aspart  6 Units Subcutaneous TID WC   insulin  glargine-yfgn  18 Units Subcutaneous BID   lactulose   30 g Oral TID   midodrine   10 mg Oral TID WC   pantoprazole   40 mg Oral BID AC   rifaximin   550 mg Oral BID   sodium chloride  flush  3 mL Intravenous Q12H   Continuous Infusions:   PRN Meds:.acetaminophen  **OR** acetaminophen , diltiazem , melatonin   I have personally reviewed following labs and imaging studies  LABORATORY DATA: CBC: Recent Labs  Lab 12/17/23 0628 12/18/23 0131 12/20/23 0344  WBC 4.9 4.5 5.6  HGB 10.4* 10.5* 11.2*  HCT 31.7* 31.5* 34.3*  MCV 107.5* 106.4* 105.2*  PLT 73* 80* 116*    Basic Metabolic Panel: Recent Labs  Lab 12/17/23 0628  NA 135  K 3.8  CL 105  CO2 22  GLUCOSE 203*  BUN <5*  CREATININE 0.63  CALCIUM  9.3    GFR: Estimated Creatinine Clearance: 78.4 mL/min (by C-G formula based on SCr of 0.63 mg/dL).  Liver Function Tests: Recent Labs  Lab 12/17/23 0628  AST 70*  ALT 78*  ALKPHOS 153*  BILITOT 1.0  PROT 5.4*  ALBUMIN  2.9*   No results for input(s): "LIPASE", "AMYLASE" in the last 168 hours. Recent Labs  Lab  12/18/23 0131  AMMONIA 44*    Coagulation Profile: No results for input(s): "INR", "PROTIME" in the last 168 hours.  Cardiac Enzymes: No results for input(s): "CKTOTAL", "CKMB", "CKMBINDEX", "TROPONINI" in the last 168 hours.  BNP (last 3 results) No results for input(s): "PROBNP" in the last 8760 hours.  Lipid Profile: No results for input(s): "CHOL", "HDL", "LDLCALC", "TRIG", "CHOLHDL", "LDLDIRECT" in the last 72 hours.  Thyroid  Function Tests: No results for input(s): "TSH", "T4TOTAL", "FREET4", "T3FREE", "THYROIDAB" in the last 72 hours.  Anemia Panel: No results for input(s): "VITAMINB12", "FOLATE", "FERRITIN", "TIBC", "IRON ", "RETICCTPCT" in the last 72 hours.  Urine analysis:    Component Value Date/Time   COLORURINE YELLOW  12/08/2023 1733   APPEARANCEUR HAZY (A) 12/08/2023 1733   APPEARANCEUR Clear 07/05/2021 1129   LABSPEC 1.019 12/08/2023 1733   PHURINE 7.0 12/08/2023 1733   GLUCOSEU NEGATIVE 12/08/2023 1733   GLUCOSEU NEGATIVE 04/22/2021 1048   HGBUR NEGATIVE 12/08/2023 1733   BILIRUBINUR NEGATIVE 12/08/2023 1733   BILIRUBINUR Negative 07/05/2021 1129   KETONESUR NEGATIVE 12/08/2023 1733   PROTEINUR NEGATIVE 12/08/2023 1733   UROBILINOGEN 0.2 04/22/2021 1048   NITRITE NEGATIVE 12/08/2023 1733   LEUKOCYTESUR NEGATIVE 12/08/2023 1733    Sepsis Labs: Lactic Acid, Venous    Component Value Date/Time   LATICACIDVEN 1.6 12/08/2023 1406    MICROBIOLOGY: No results found for this or any previous visit (from the past 240 hours).  RADIOLOGY STUDIES/RESULTS: No results found.   LOS: 15 days   Kimberly Penna, MD  [facility]    To contact the attending provider between 7A-7P or the covering provider during after hours 7P-7A, please log into the web site [email or web address] and access using universal [place] password for that web site. If you do not have the password, please call the hospital operator.  12/23/2023, 9:55 AM

## 2023-12-24 DIAGNOSIS — K7682 Hepatic encephalopathy: Secondary | ICD-10-CM | POA: Diagnosis not present

## 2023-12-24 DIAGNOSIS — K922 Gastrointestinal hemorrhage, unspecified: Secondary | ICD-10-CM | POA: Diagnosis not present

## 2023-12-24 DIAGNOSIS — J449 Chronic obstructive pulmonary disease, unspecified: Secondary | ICD-10-CM | POA: Diagnosis not present

## 2023-12-24 DIAGNOSIS — I480 Paroxysmal atrial fibrillation: Secondary | ICD-10-CM | POA: Diagnosis not present

## 2023-12-24 LAB — GLUCOSE, CAPILLARY
Glucose-Capillary: 55 mg/dL — ABNORMAL LOW (ref 70–99)
Glucose-Capillary: 119 mg/dL — ABNORMAL HIGH (ref 70–99)
Glucose-Capillary: 205 mg/dL — ABNORMAL HIGH (ref 70–99)
Glucose-Capillary: 148 mg/dL — ABNORMAL HIGH (ref 70–99)
Glucose-Capillary: 139 mg/dL — ABNORMAL HIGH (ref 70–99)

## 2023-12-24 MED ORDER — INSULIN GLARGINE-YFGN 100 UNIT/ML ~~LOC~~ SOLN
12.0000 [IU] | Freq: Every day | SUBCUTANEOUS | Status: DC
  Administered 2023-12-24 – 2023-12-27 (×4): 12 [IU] via SUBCUTANEOUS
  Filled 2023-12-24 (×5): qty 0.12

## 2023-12-24 MED ORDER — INSULIN GLARGINE-YFGN 100 UNIT/ML ~~LOC~~ SOLN
18.0000 [IU] | Freq: Every morning | SUBCUTANEOUS | Status: DC
  Administered 2023-12-25 – 2023-12-28 (×4): 18 [IU] via SUBCUTANEOUS
  Filled 2023-12-24 (×4): qty 0.18

## 2023-12-24 MED ORDER — ATORVASTATIN CALCIUM 40 MG PO TABS
40.0000 mg | ORAL_TABLET | Freq: Every day | ORAL | Status: DC
  Administered 2023-12-24 – 2024-01-11 (×19): 40 mg via ORAL
  Filled 2023-12-24 (×19): qty 1

## 2023-12-24 MED ORDER — INSULIN ASPART 100 UNIT/ML IJ SOLN
4.0000 [IU] | Freq: Three times a day (TID) | INTRAMUSCULAR | Status: DC
  Administered 2023-12-24 – 2023-12-28 (×10): 4 [IU] via SUBCUTANEOUS

## 2023-12-24 NOTE — Plan of Care (Signed)

## 2023-12-24 NOTE — Plan of Care (Signed)

## 2023-12-24 NOTE — Progress Notes (Signed)
 PROGRESS NOTE        PATIENT DETAILS Name: Tony Cantu Age: 73 y.o. Sex: male Date of Birth: May 16, 1951 Admit Date: 12/08/2023 Admitting Physician Johnetta Nab, MD [id number], Gwendalyn Lemma, DO  Brief Summary: Patient is a 73 y.o.  male with history of [name] cirrhosis, PAF on anticoagulation who presented with somnolence/fatigue-was found to have hepatic encephalopathy along with upper GI bleeding.  Hospital course complicated by orthostatic hypotension.  Significant events: 4/11>> admit to [facility]  Significant studies: 4/11>> CT abdomen/pelvis: Mild circumferential thickening of the rectum-favoring proctitis. 4/11>> CT head: No acute intracranial abnormality.  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  Consults: GI  Subjective: Lying comfortably in bed-no major issues overnight.  Frustrated that he is still hospitalized.  Awaiting SNF bed placement.  Objective: Vitals: Blood pressure 101/66, pulse 65, temperature 98.6 F (37 C), temperature source Oral, resp. rate 20, height 5' 7.99" (1.727 m), weight 74.8 kg, SpO2 94%.   Exam: Awake/alert-not in any distress-frail-appearing Abdomen is soft nontender nondistended No leg edema Nonfocal exam.  Pertinent Labs/Radiology:    Latest Ref Rng & Units 12/20/2023    3:44 AM 12/18/2023    1:31 AM 12/17/2023    6:28 AM  CBC  WBC 4.0 - 10.5 K/uL 5.6  4.5  4.9   Hemoglobin 13.0 - 17.0 g/dL 11.9  14.7  82.9   Hematocrit 39.0 - 52.0 % 34.3  31.5  31.7   Platelets 150 - 400 K/uL 116  80  73     Lab Results  Component Value Date   NA 135 12/17/2023   K 3.8 12/17/2023   CL 105 12/17/2023   CO2 22 12/17/2023      Assessment/Plan: Hepatic encephalopathy Likely triggered by upper GI bleeding Resolved with  lactulose /Xifaxan  Completely awake/alert this morning  Upper 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  Chronic HFpEF Euvolemic on exam Diuretics as needed  PAF Telemetry monitoring Amiodarone  Eliquis .  HLD Continue to hold statins-trend LFTs.  Transaminitis Unclear etiology Downtrending Follow-remains on amiodarone -hence watch closely.  COPD/asthma Stable Bronchodilators  DM-2 (A1c 4.4 on 1/21) CBGs fluctuating quite a bit appears to have some brittle diabetes Hypoglycemic episode this morning Decrease p.m. Semglee  to 12 units Continue 18 units of Semglee  in a.m. Decrease Premeal NovoLog  to 4 units Continue SSI Follow/optimize.   Recent Labs    12/23/23 2003 12/24/23 0803 12/24/23 0910  GLUCAP 229* 55* 119*     Debility/deconditioning Due to acute illness PT/OT eval-SNF recommended.  Code status:   Code Status: Limited: Do not attempt resuscitation (DNR) -DNR-LIMITED -Do Not Intubate/DNI    DVT Prophylaxis: Place TED hose Start: 12/14/23 1312 Place and maintain sequential compression device Start: 12/09/23 0555 SCDs Start: 12/08/23 1707 apixaban  (ELIQUIS ) tablet 5 mg    Family Communication: None at bedside   Disposition Plan: Status is: Inpatient Remains inpatient appropriate because: Severity of illness   Planned Discharge Destination:Skilled nursing facility   Diet: Diet Order  Diet regular Room service appropriate? Yes; Fluid consistency: Thin  Diet effective now           Diet - low sodium heart healthy           Diet Carb Modified                     Antimicrobial  agents: Anti-infectives (From admission, onward)    Start     Dose/Rate Route Frequency Ordered Stop   12/21/23 0000  rifaximin  (XIFAXAN ) 550 MG TABS tablet        550 mg Oral 2 times daily 12/21/23 0826     12/10/23 2200  rifaximin  (XIFAXAN ) tablet 550 mg        550 mg Oral 2 times daily 12/10/23 1815     12/09/23 1000  rifaximin  (XIFAXAN ) tablet 550 mg  Status:  Discontinued        550 mg Per Tube 2 times daily 12/09/23 0827 12/10/23 1815        MEDICATIONS: Scheduled Meds:  amiodarone   100 mg Oral Daily   apixaban   5 mg Oral BID   feeding supplement (GLUCERNA SHAKE)  237 mL Oral TID BM   Gerhardt's butt cream   Topical TID   insulin  aspart  0-5 Units Subcutaneous QHS   insulin  aspart  0-9 Units Subcutaneous TID WC   insulin  aspart  6 Units Subcutaneous TID WC   insulin  glargine-yfgn  18 Units Subcutaneous BID   lactulose   30 g Oral TID   midodrine   10 mg Oral TID WC   pantoprazole   40 mg Oral BID AC   rifaximin   550 mg Oral BID   sodium chloride  flush  3 mL Intravenous Q12H   Continuous Infusions:   PRN Meds:.acetaminophen  **OR** acetaminophen , diltiazem , melatonin   I have personally reviewed following labs and imaging studies  LABORATORY DATA: CBC: Recent Labs  Lab 12/18/23 0131 12/20/23 0344  WBC 4.5 5.6  HGB 10.5* 11.2*  HCT 31.5* 34.3*  MCV 106.4* 105.2*  PLT 80* 116*    Basic Metabolic Panel: No results for input(s): "NA", "K", "CL", "CO2", "GLUCOSE", "BUN", "CREATININE", "CALCIUM ", "MG", "PHOS" in the last 168 hours.   GFR: Estimated Creatinine Clearance: 78.4 mL/min (by C-G formula based on SCr of 0.63 mg/dL).  Liver Function Tests: No results for input(s): "AST", "ALT", "ALKPHOS", "BILITOT", "PROT", "ALBUMIN " in the last 168 hours.  No results for input(s): "LIPASE", "AMYLASE" in the last 168 hours. Recent Labs  Lab 12/18/23 0131  AMMONIA 44*    Coagulation Profile: No results for input(s): "INR", "PROTIME" in the last 168  hours.  Cardiac Enzymes: No results for input(s): "CKTOTAL", "CKMB", "CKMBINDEX", "TROPONINI" in the last 168 hours.  BNP (last 3 results) No results for input(s): "PROBNP" in the last 8760 hours.  Lipid Profile: No results for input(s): "CHOL", "HDL", "LDLCALC", "TRIG", "CHOLHDL", "LDLDIRECT" in the last 72 hours.  Thyroid  Function Tests: No results for input(s): "TSH", "T4TOTAL", "FREET4", "T3FREE", "THYROIDAB" in the last 72 hours.  Anemia Panel: No results for input(s): "VITAMINB12", "FOLATE", "FERRITIN", "TIBC", "IRON ", "RETICCTPCT" in the last 72 hours.  Urine analysis:    Component Value Date/Time   COLORURINE YELLOW 12/08/2023 1733   APPEARANCEUR HAZY (A) 12/08/2023 1733   APPEARANCEUR Clear 07/05/2021 1129   LABSPEC 1.019 12/08/2023 1733   PHURINE 7.0 12/08/2023 1733   GLUCOSEU NEGATIVE 12/08/2023 1733   GLUCOSEU NEGATIVE 04/22/2021 1048   HGBUR NEGATIVE 12/08/2023 1733   BILIRUBINUR NEGATIVE 12/08/2023 1733  BILIRUBINUR Negative 07/05/2021 1129   KETONESUR NEGATIVE 12/08/2023 1733   PROTEINUR NEGATIVE 12/08/2023 1733   UROBILINOGEN 0.2 04/22/2021 1048   NITRITE NEGATIVE 12/08/2023 1733   LEUKOCYTESUR NEGATIVE 12/08/2023 1733    Sepsis Labs: Lactic Acid, Venous    Component Value Date/Time   LATICACIDVEN 1.6 12/08/2023 1406    MICROBIOLOGY: No results found for this or any previous visit (from the past 240 hours).  RADIOLOGY STUDIES/RESULTS: No results found.   LOS: 16 days   Kimberly Penna, MD  [facility]    To contact the attending provider between 7A-7P or the covering provider during after hours 7P-7A, please log into the web site [email or web address] and access using universal [place] password for that web site. If you do not have the password, please call the hospital operator.  12/24/2023, 10:55 AM

## 2023-12-25 DIAGNOSIS — K922 Gastrointestinal hemorrhage, unspecified: Secondary | ICD-10-CM | POA: Diagnosis not present

## 2023-12-25 DIAGNOSIS — I480 Paroxysmal atrial fibrillation: Secondary | ICD-10-CM | POA: Diagnosis not present

## 2023-12-25 DIAGNOSIS — K7682 Hepatic encephalopathy: Secondary | ICD-10-CM | POA: Diagnosis not present

## 2023-12-25 DIAGNOSIS — J449 Chronic obstructive pulmonary disease, unspecified: Secondary | ICD-10-CM | POA: Diagnosis not present

## 2023-12-25 LAB — GLUCOSE, CAPILLARY
Glucose-Capillary: 214 mg/dL — ABNORMAL HIGH (ref 70–99)
Glucose-Capillary: 249 mg/dL — ABNORMAL HIGH (ref 70–99)
Glucose-Capillary: 228 mg/dL — ABNORMAL HIGH (ref 70–99)
Glucose-Capillary: 320 mg/dL — ABNORMAL HIGH (ref 70–99)

## 2023-12-25 MED ORDER — MIRTAZAPINE 15 MG PO TBDP
15.0000 mg | ORAL_TABLET | Freq: Every day | ORAL | Status: DC
  Administered 2023-12-25 – 2024-01-10 (×17): 15 mg via ORAL
  Filled 2023-12-25 (×18): qty 1

## 2023-12-25 NOTE — Progress Notes (Signed)
 PROGRESS NOTE        PATIENT DETAILS Name: Tony Cantu Age: 73 y.o. Sex: male Date of Birth: 1950/10/16 Admit Date: 12/08/2023 Admitting Physician Johnetta Nab, MD [id number], Gwendalyn Lemma, DO  Brief Summary: Patient is a 73 y.o.  male with history of [name] cirrhosis, PAF on anticoagulation who presented with somnolence/fatigue-was found to have hepatic encephalopathy along with upper GI bleeding.  Hospital course complicated by orthostatic hypotension.  Significant events: 4/11>> admit to [facility]  Significant studies: 4/11>> CT abdomen/pelvis: Mild circumferential thickening of the rectum-favoring proctitis. 4/11>> CT head: No acute intracranial abnormality.  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  Consults: GI  Subjective: No major issues-lying comfortably in bed.  Awaiting SNF placement.  Objective: Vitals: Blood pressure 101/68, pulse 68, temperature 97.6 F (36.4 C), temperature source Oral, resp. rate 20, height 5' 7.99" (1.727 m), weight 74.8 kg, SpO2 94%.   Exam: Awake/alert-frail appearing Nontender nondistended abdomen Nonfocal exam No leg edema edema  Pertinent Labs/Radiology:    Latest Ref Rng & Units 12/20/2023    3:44 AM 12/18/2023    1:31 AM 12/17/2023    6:28 AM  CBC  WBC 4.0 - 10.5 K/uL 5.6  4.5  4.9   Hemoglobin 13.0 - 17.0 g/dL 95.6  21.3  08.6   Hematocrit 39.0 - 52.0 % 34.3  31.5  31.7   Platelets 150 - 400 K/uL 116  80  73     Lab Results  Component Value Date   NA 135 12/17/2023   K 3.8 12/17/2023   CL 105 12/17/2023   CO2 22 12/17/2023      Assessment/Plan: Hepatic encephalopathy Likely triggered by upper GI bleeding Resolved with lactulose /Xifaxan  Completely awake/alert this morning  Upper GI bleeding Secondary to  AVMs S/p EGD with APC 4/16 No further bleeding-Hb stable Follow CBC Continue PPI twice daily  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  Chronic HFpEF Euvolemic on exam Diuretics as needed  PAF Telemetry monitoring Amiodarone  Eliquis .  HLD Continue to hold statins-trend LFTs.  Transaminitis Unclear etiology Downtrending Follow-remains on amiodarone -hence watch closely.  COPD/asthma Stable Bronchodilators  DM-2 (A1c 4.4 on 1/21) CBGs has now stabilized Allow some amount of permissive hyperglycemia Continue Semglee  18 units in a.m. Continue Semglee  12 units in p.m. Scheduled NovoLog  4 units with meals SSI Follow/optimize   Recent Labs    12/24/23 1555 12/24/23 1932 12/25/23 0832  GLUCAP 148* 139* 214*     Debility/deconditioning Due to acute illness PT/OT eval-SNF recommended.  Code status:   Code Status: Limited: Do not attempt resuscitation (DNR) -DNR-LIMITED -Do Not Intubate/DNI    DVT Prophylaxis: Place TED hose Start: 12/14/23 1312 Place and maintain sequential compression device Start: 12/09/23 0555 SCDs Start: 12/08/23 1707 apixaban  (ELIQUIS ) tablet 5 mg    Family Communication: None at bedside   Disposition Plan: Status is: Inpatient Remains inpatient appropriate because: Severity of illness   Planned Discharge Destination:Skilled nursing facility   Diet: Diet Order             Diet regular Room service appropriate? Yes; Fluid consistency: Thin  Diet  effective now           Diet - low sodium heart healthy           Diet Carb Modified                     Antimicrobial agents: Anti-infectives (From admission, onward)    Start     Dose/Rate Route Frequency Ordered Stop   12/21/23 0000  rifaximin  (XIFAXAN ) 550 MG TABS  tablet        550 mg Oral 2 times daily 12/21/23 0826     12/10/23 2200  rifaximin  (XIFAXAN ) tablet 550 mg        550 mg Oral 2 times daily 12/10/23 1815     12/09/23 1000  rifaximin  (XIFAXAN ) tablet 550 mg  Status:  Discontinued        550 mg Per Tube 2 times daily 12/09/23 0827 12/10/23 1815        MEDICATIONS: Scheduled Meds:  amiodarone   100 mg Oral Daily   apixaban   5 mg Oral BID   atorvastatin   40 mg Oral Daily   feeding supplement (GLUCERNA SHAKE)  237 mL Oral TID BM   Gerhardt's butt cream   Topical TID   insulin  aspart  0-5 Units Subcutaneous QHS   insulin  aspart  0-9 Units Subcutaneous TID WC   insulin  aspart  4 Units Subcutaneous TID WC   insulin  glargine-yfgn  12 Units Subcutaneous QHS   insulin  glargine-yfgn  18 Units Subcutaneous q morning   lactulose   30 g Oral TID   midodrine   10 mg Oral TID WC   pantoprazole   40 mg Oral BID AC   rifaximin   550 mg Oral BID   sodium chloride  flush  3 mL Intravenous Q12H   Continuous Infusions:   PRN Meds:.acetaminophen  **OR** acetaminophen , diltiazem , melatonin   I have personally reviewed following labs and imaging studies  LABORATORY DATA: CBC: Recent Labs  Lab 12/20/23 0344  WBC 5.6  HGB 11.2*  HCT 34.3*  MCV 105.2*  PLT 116*    Basic Metabolic Panel: No results for input(s): "NA", "K", "CL", "CO2", "GLUCOSE", "BUN", "CREATININE", "CALCIUM ", "MG", "PHOS" in the last 168 hours.   GFR: Estimated Creatinine Clearance: 78.4 mL/min (by C-G formula based on SCr of 0.63 mg/dL).  Liver Function Tests: No results for input(s): "AST", "ALT", "ALKPHOS", "BILITOT", "PROT", "ALBUMIN " in the last 168 hours.  No results for input(s): "LIPASE", "AMYLASE" in the last 168 hours. No results for input(s): "AMMONIA" in the last 168 hours.   Coagulation Profile: No results for input(s): "INR", "PROTIME" in the last 168 hours.  Cardiac Enzymes: No results for input(s): "CKTOTAL", "CKMB", "CKMBINDEX", "TROPONINI" in the  last 168 hours.  BNP (last 3 results) No results for input(s): "PROBNP" in the last 8760 hours.  Lipid Profile: No results for input(s): "CHOL", "HDL", "LDLCALC", "TRIG", "CHOLHDL", "LDLDIRECT" in the last 72 hours.  Thyroid  Function Tests: No results for input(s): "TSH", "T4TOTAL", "FREET4", "T3FREE", "THYROIDAB" in the last 72 hours.  Anemia Panel: No results for input(s): "VITAMINB12", "FOLATE", "FERRITIN", "TIBC", "IRON ", "RETICCTPCT" in the last 72 hours.  Urine analysis:    Component Value Date/Time   COLORURINE YELLOW 12/08/2023 1733   APPEARANCEUR HAZY (A) 12/08/2023 1733   APPEARANCEUR Clear 07/05/2021 1129   LABSPEC 1.019 12/08/2023 1733   PHURINE 7.0 12/08/2023 1733   GLUCOSEU NEGATIVE 12/08/2023 1733   GLUCOSEU NEGATIVE 04/22/2021 1048   HGBUR NEGATIVE 12/08/2023 1733   BILIRUBINUR NEGATIVE 12/08/2023 1733  BILIRUBINUR Negative 07/05/2021 1129   KETONESUR NEGATIVE 12/08/2023 1733   PROTEINUR NEGATIVE 12/08/2023 1733   UROBILINOGEN 0.2 04/22/2021 1048   NITRITE NEGATIVE 12/08/2023 1733   LEUKOCYTESUR NEGATIVE 12/08/2023 1733    Sepsis Labs: Lactic Acid, Venous    Component Value Date/Time   LATICACIDVEN 1.6 12/08/2023 1406    MICROBIOLOGY: No results found for this or any previous visit (from the past 240 hours).  RADIOLOGY STUDIES/RESULTS: No results found.   LOS: 17 days   Kimberly Penna, MD  [facility]    To contact the attending provider between 7A-7P or the covering provider during after hours 7P-7A, please log into the web site [email or web address] and access using universal [place] password for that web site. If you do not have the password, please call the hospital operator.  12/25/2023, 11:14 AM

## 2023-12-25 NOTE — TOC Progression Note (Signed)
 Transition of Care [facility]) - Progression Note    Patient Details  Name: Tony Cantu MRN: [id number] Date of Birth: [birth year]  Transition of [facility]) CM/SW Contact  Jannice Mends, LCSW Phone Number: 12/25/2023, 4:31 PM  Clinical Narrative:    CSW spoke with Tony Cantu who stated she would call CSW in the morning.    Expected Discharge Plan: Skilled Nursing Facility Barriers to Discharge: Continued Medical Work up, English as a second language teacher  Expected Discharge Plan and Services In-house Referral: Clinical Social Work   Post Acute Care Choice: Skilled Nursing Facility Living arrangements for the past 2 months: Single Family Home, Skilled Nursing Facility Expected Discharge Date: 12/21/23                                     Social Determinants of Health (SDOH) Interventions SDOH Screenings   Food Insecurity: Patient Unable To Answer (12/09/2023)  Housing: Patient Unable To Answer (12/09/2023)  Recent Concern: Housing - High Risk (10/30/2023)  Transportation Needs: Patient Unable To Answer (12/09/2023)  Utilities: Patient Unable To Answer (12/09/2023)  Depression (PHQ2-9): Medium Risk (09/13/2023)  Financial Resource Strain: Low Risk  (06/09/2022)  Social Connections: Patient Unable To Answer (12/09/2023)  Recent Concern: Social Connections - Socially Isolated (10/30/2023)  Tobacco Use: Medium Risk (12/08/2023)    Readmission Risk Interventions     No data to display

## 2023-12-25 NOTE — Plan of Care (Signed)
  Problem: Activity: Goal: Risk for activity intolerance will decrease Outcome: Progressing   Problem: Nutrition: Goal: Adequate nutrition will be maintained Outcome: Progressing   Problem: Safety: Goal: Ability to remain free from injury will improve Outcome: Progressing   Problem: Skin Integrity: Goal: Risk for impaired skin integrity will decrease Outcome: Progressing   Problem: Skin Integrity: Goal: Risk for impaired skin integrity will decrease Outcome: Progressing

## 2023-12-26 DIAGNOSIS — J449 Chronic obstructive pulmonary disease, unspecified: Secondary | ICD-10-CM | POA: Diagnosis not present

## 2023-12-26 DIAGNOSIS — I480 Paroxysmal atrial fibrillation: Secondary | ICD-10-CM | POA: Diagnosis not present

## 2023-12-26 DIAGNOSIS — K7682 Hepatic encephalopathy: Secondary | ICD-10-CM | POA: Diagnosis not present

## 2023-12-26 DIAGNOSIS — K922 Gastrointestinal hemorrhage, unspecified: Secondary | ICD-10-CM | POA: Diagnosis not present

## 2023-12-26 LAB — BASIC METABOLIC PANEL WITH GFR
Anion gap: 10 (ref 5–15)
BUN: 7 mg/dL — ABNORMAL LOW (ref 8–23)
CO2: 25 mmol/L (ref 22–32)
Calcium: 10.8 mg/dL — ABNORMAL HIGH (ref 8.9–10.3)
Chloride: 102 mmol/L (ref 98–111)
Creatinine, Ser: 0.8 mg/dL (ref 0.61–1.24)
GFR, Estimated: >60 mL/min (ref >60)
Glucose, Bld: 313 mg/dL — ABNORMAL HIGH (ref 70–99)
Potassium: 3.2 mmol/L — ABNORMAL LOW (ref 3.5–5.1)
Sodium: 137 mmol/L (ref 135–145)

## 2023-12-26 LAB — CBC
HCT: 33.8 % — ABNORMAL LOW (ref 39.0–52.0)
Hemoglobin: 11.5 g/dL — ABNORMAL LOW (ref 13.0–17.0)
MCH: 34.7 pg — ABNORMAL HIGH (ref 26.0–34.0)
MCHC: 34 g/dL (ref 30.0–36.0)
MCV: 102.1 fL — ABNORMAL HIGH (ref 80.0–100.0)
Platelets: 184 10*3/uL (ref 150–400)
RBC: 3.31 MIL/uL — ABNORMAL LOW (ref 4.22–5.81)
RDW: 16.4 % — ABNORMAL HIGH (ref 11.5–15.5)
WBC: 6.4 10*3/uL (ref 4.0–10.5)
nRBC: 0 % (ref 0.0–0.2)

## 2023-12-26 LAB — GLUCOSE, CAPILLARY
Glucose-Capillary: 265 mg/dL — ABNORMAL HIGH (ref 70–99)
Glucose-Capillary: 255 mg/dL — ABNORMAL HIGH (ref 70–99)
Glucose-Capillary: 113 mg/dL — ABNORMAL HIGH (ref 70–99)
Glucose-Capillary: 157 mg/dL — ABNORMAL HIGH (ref 70–99)

## 2023-12-26 LAB — AMMONIA: Ammonia: 61 umol/L — ABNORMAL HIGH (ref 9–35)

## 2023-12-26 MED ORDER — POTASSIUM CHLORIDE CRYS ER 20 MEQ PO TBCR
40.0000 meq | EXTENDED_RELEASE_TABLET | Freq: Once | ORAL | Status: AC
  Administered 2023-12-26: 40 meq via ORAL
  Filled 2023-12-26: qty 2

## 2023-12-26 NOTE — Progress Notes (Signed)
 Occupational Therapy Treatment Patient Details Name: Tony Cantu MRN: [id number] DOB: 15-Mar-1951 Today's Date: 12/26/2023   History of present illness Patient is 73 yo male admitted on 12/08/23 for AMS, nausea, vomiting, and dark stool. PMHx significant for type 1 diabetes, A-fib on Eliquis , seizure disorder, aortic stenosis, HTN, peptic ulcer disease, OA with bilateral knee replacement, polyclonal gammopathy, OSA on CPAP, GERD, COPD/asthma, chronic diastolic CHF, cirrhosis, portal hypertension, and BPH.   OT comments  Pt c/o fatigued, lethargic today. Pt sat up in recliner for two hours, BP remained stable throughout session. Pt generalized weakness limits participation. Pt max A x2 for physical assist to stand upright, posterior lean requiring OT assistance to maintain balance. Two attempts to pivot to bed from recliner, Pt barely able to take steps to pivot. Pt immediately asked to lie down, too tired to further participate in therapy. Assisted Pt with bed level hygiene for toileting, mod A rolling to L/R using bed rails. Pt DC plan to postacute rehab <3hrs/day still appropriate, will continue to see acutely to progress as able.       If plan is discharge home, recommend the following:  Two people to help with walking and/or transfers;A lot of help with bathing/dressing/bathroom;Assistance with cooking/housework;Direct supervision/assist for medications management;Direct supervision/assist for financial management;Assist for transportation;Help with stairs or ramp for entrance;Supervision due to cognitive status   Equipment Recommendations  Other (comment)    Recommendations for Other Services      Precautions / Restrictions Precautions Precautions: Fall Recall of Precautions/Restrictions: Impaired Precaution/Restrictions Comments: watch BP; Frequent loose stools Restrictions Weight Bearing Restrictions Per Provider Order: No       Mobility Bed Mobility Overal bed mobility: Needs  Assistance Bed Mobility: Rolling, Sit to Supine Rolling: Min assist, Mod assist, Used rails     Sit to supine: Mod assist, Used rails   General bed mobility comments: Pt mod A for rolling L/R, assisting back to supine. generalized weakness limits participation    Transfers Overall transfer level: Needs assistance Equipment used: Rolling walker (2 wheels) Transfers: Sit to/from Stand Sit to Stand: Max assist, +2 physical assistance, +2 safety/equipment Stand pivot transfers: Max assist, +2 physical assistance, +2 safety/equipment         General transfer comment: Pt max A x2 for stand pivot transfer, barely able to take steps and stand straight up enough to pivot to bed, OT/mobility mainly assisted with pivoting     Balance Overall balance assessment: Needs assistance Sitting-balance support: Feet supported, Bilateral upper extremity supported Sitting balance-Leahy Scale: Poor Sitting balance - Comments: static sitting recliner, B hands on hand rests for support   Standing balance support: Bilateral upper extremity supported, Reliant on assistive device for balance Standing balance-Leahy Scale: Poor Standing balance comment: reliant on RW and x2 physical assist to stand straight up, posterior lean                           ADL either performed or assessed with clinical judgement   ADL Overall ADL's : Needs assistance/impaired Eating/Feeding: Sitting;Set up   Grooming: Wash/dry hands;Wash/dry face;Set up                   Toilet Transfer: Maximal assistance;+2 for physical assistance;+2 for safety/equipment;BSC/3in1;Rolling walker (2 wheels)   Toileting- Clothing Manipulation and Hygiene: Total assistance;Bed level         General ADL Comments: Pt lethargic today, tired form working with PT earlier and sitting up in  chair for ~2 hours.    Extremity/Trunk Assessment Upper Extremity Assessment Upper Extremity Assessment: Generalized weakness             Vision       Perception     Praxis     Communication Communication Communication: No apparent difficulties   Cognition Arousal: Alert Behavior During Therapy: WFL for tasks assessed/performed Cognition: No family/caregiver present to determine baseline   Orientation impairments: Situation, Time Awareness: Online awareness intact Memory impairment (select all impairments): Short-term memory Attention impairment (select first level of impairment): Divided attention Executive functioning impairment (select all impairments): Problem solving, Reasoning, Sequencing, Organization OT - Cognition Comments: able to follow commands and fully participate, lethargic and fatigued.                 Following commands: Impaired Following commands impaired: Only follows one step commands consistently      Cueing   Cueing Techniques: Verbal cues, Tactile cues, Gestural cues  Exercises      Shoulder Instructions       General Comments BP WNLs during session.    Pertinent Vitals/ Pain       Pain Assessment Pain Assessment: Faces Faces Pain Scale: Hurts little more Pain Location: buttocks Pain Descriptors / Indicators: Grimacing, Guarding Pain Intervention(s): Monitored during session  Home Living                                          Prior Functioning/Environment              Frequency  Min 1X/week        Progress Toward Goals  OT Goals(current goals can now be found in the care plan section)  Progress towards OT goals: Progressing toward goals  Acute Rehab OT Goals Patient Stated Goal: to improve strength and activity tolerance OT Goal Formulation: With patient Time For Goal Achievement: 01/04/24 Potential to Achieve Goals: Fair ADL Goals Pt Will Perform Grooming: with set-up;sitting Pt Will Perform Upper Body Dressing: with supervision;sitting Pt Will Perform Lower Body Dressing: with mod assist;sit to/from  stand;sitting/lateral leans Pt Will Transfer to Toilet: with min assist;stand pivot transfer Pt Will Perform Toileting - Clothing Manipulation and hygiene: with mod assist;sitting/lateral leans;sit to/from stand Additional ADL Goal #1: Pt will complete bed mobility with CGA in preparation for OOB functional tasks  Plan      Co-evaluation                 AM-PAC OT "6 Clicks" Daily Activity     Outcome Measure   Help from another person eating meals?: A Little Help from another person taking care of personal grooming?: A Little Help from another person toileting, which includes using toliet, bedpan, or urinal?: Total Help from another person bathing (including washing, rinsing, drying)?: A Lot Help from another person to put on and taking off regular upper body clothing?: A Little Help from another person to put on and taking off regular lower body clothing?: A Lot 6 Click Score: 14    End of Session    OT Visit Diagnosis: Unsteadiness on feet (R26.81);Other abnormalities of gait and mobility (R26.89);Muscle weakness (generalized) (M62.81);Other symptoms and signs involving cognitive function   Activity Tolerance     Patient Left     Nurse Communication          Time: 1610-9604 OT Time Calculation (min): 15 min  Charges: OT General Charges $OT Visit: 1 Visit OT Treatments $Therapeutic Activity: 8-22 mins  [address] OTR/L   Scherry Curtis 12/26/2023, 2:13 PM

## 2023-12-26 NOTE — Progress Notes (Signed)
 PROGRESS NOTE        PATIENT DETAILS Name: Tony Cantu Age: 73 y.o. Sex: male Date of Birth: July 19, 1951 Admit Date: 12/08/2023 Admitting Physician Johnetta Nab, MD QIH:[id number], Gwendalyn Lemma, DO  Brief Summary: Patient is a 73 y.o.  male with history of [name] cirrhosis, PAF on anticoagulation who presented with somnolence/fatigue-was found to have hepatic encephalopathy along with upper GI bleeding.  Hospital course complicated by orthostatic hypotension.  Significant events: 4/11>> admit to [facility]  Significant studies: 4/11>> CT abdomen/pelvis: Mild circumferential thickening of the rectum-favoring proctitis. 4/11>> CT head: No acute intracranial abnormality.  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  Consults: GI  Subjective: Frustrated that he remains hospitalized-SNF pending.  Had 2 BMs yesterday.  Appears somewhat depressed-but this is primarily due to frustration per patient.  Claims his appetite is slowly improving.  Objective: Vitals: Blood pressure 104/60, pulse 83, temperature 98.2 F (36.8 C), temperature source Oral, resp. rate 16, height 5' 7.99" (1.727 m), weight 74.8 kg, SpO2 98%.   Exam: Awake/alert Nontender nondistended abdomen Nonfocal exam No leg edema.  Pertinent Labs/Radiology:    Latest Ref Rng & Units 12/26/2023    3:39 AM 12/20/2023    3:44 AM 12/18/2023    1:31 AM  CBC  WBC 4.0 - 10.5 K/uL 6.4  5.6  4.5   Hemoglobin 13.0 - 17.0 g/dL 75.6  43.3  29.5   Hematocrit 39.0 - 52.0 % 33.8  34.3  31.5   Platelets 150 - 400 K/uL 184  116  80     Lab Results  Component Value Date   NA 137 12/26/2023   K 3.2 (L) 12/26/2023   CL 102 12/26/2023   CO2 25 12/26/2023      Assessment/Plan: Hepatic encephalopathy Likely triggered by upper GI  bleeding Resolved with lactulose /Xifaxan  Completely awake/alert this morning  Upper GI bleeding Secondary to AVMs S/p EGD with APC 4/16 No further bleeding-Hb stable Follow CBC Continue PPI twice daily  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  Chronic HFpEF Euvolemic on exam Diuretics as needed  Hypokalemia Replete/recheck  Mild hypercalcemia Repeat electrolytes tomorrow-if still persistently elevated then will consider further workup.  PAF Telemetry monitoring Amiodarone  Eliquis .  HLD Continue to hold statins-trend LFTs.  Transaminitis Unclear etiology Downtrending Follow-remains on amiodarone -hence watch closely.  COPD/asthma Stable Bronchodilators  DM-2 (A1c 4.4 on 1/21) CBGs has now stabilized Allow some amount of permissive hyperglycemia given erratic oral intake. Continue Semglee  18 units in a.m. Continue Semglee  12 units in p.m. Scheduled NovoLog  4 units with meals SSI Follow/optimize-reassess on 4/30.   Recent Labs    12/25/23 1611 12/25/23 2104 12/26/23 0932  GLUCAP 228* 320* 265*    Failure to thrive syndrome Poor oral intake Continue PT/OT/nutrition eval Remeron started Appears somewhat depressed-but per patient this is primarily due to his frustration of remaining in the hospital.  He does not want to talk to a psychiatrist.  Debility/deconditioning Due to acute illness PT/OT eval-SNF recommended.  Code status:   Code Status: Limited: Do not attempt resuscitation (DNR) -DNR-LIMITED -Do Not Intubate/DNI    DVT Prophylaxis:  Place TED hose Start: 12/14/23 1312 Place and maintain sequential compression device Start: 12/09/23 0555 SCDs Start: 12/08/23 1707 apixaban  (ELIQUIS ) tablet 5 mg    Family Communication: None at  bedside   Disposition Plan: Status is: Inpatient Remains inpatient appropriate because: Severity of illness   Planned Discharge Destination:Skilled nursing facility   Diet: Diet Order             Diet regular Room service appropriate? Yes; Fluid consistency: Thin  Diet effective now           Diet - low sodium heart healthy           Diet Carb Modified                     Antimicrobial agents: Anti-infectives (From admission, onward)    Start     Dose/Rate Route Frequency Ordered Stop   12/21/23 0000  rifaximin  (XIFAXAN ) 550 MG TABS tablet        550 mg Oral 2 times daily 12/21/23 0826     12/10/23 2200  rifaximin  (XIFAXAN ) tablet 550 mg        550 mg Oral 2 times daily 12/10/23 1815     12/09/23 1000  rifaximin  (XIFAXAN ) tablet 550 mg  Status:  Discontinued        550 mg Per Tube 2 times daily 12/09/23 0827 12/10/23 1815        MEDICATIONS: Scheduled Meds:  amiodarone   100 mg Oral Daily   apixaban   5 mg Oral BID   atorvastatin   40 mg Oral Daily   feeding supplement (GLUCERNA SHAKE)  237 mL Oral TID BM   Gerhardt's butt cream   Topical TID   insulin  aspart  0-5 Units Subcutaneous QHS   insulin  aspart  0-9 Units Subcutaneous TID WC   insulin  aspart  4 Units Subcutaneous TID WC   insulin  glargine-yfgn  12 Units Subcutaneous QHS   insulin  glargine-yfgn  18 Units Subcutaneous q morning   lactulose   30 g Oral TID   midodrine   10 mg Oral TID WC   mirtazapine  15 mg Oral QHS   pantoprazole   40 mg Oral BID AC   rifaximin   550 mg Oral BID   sodium chloride  flush  3 mL Intravenous Q12H   Continuous Infusions:   PRN Meds:.acetaminophen  **OR** acetaminophen , diltiazem , melatonin   I have personally reviewed following labs and imaging studies  LABORATORY DATA: CBC: Recent Labs  Lab 12/20/23 0344 12/26/23 0339  WBC 5.6 6.4  HGB 11.2* 11.5*  HCT 34.3* 33.8*  MCV 105.2* 102.1*  PLT 116* 184    Basic Metabolic Panel: Recent Labs  Lab 12/26/23 0339   NA 137  K 3.2*  CL 102  CO2 25  GLUCOSE 313*  BUN 7*  CREATININE 0.80  CALCIUM  10.8*     GFR: Estimated Creatinine Clearance: 78.4 mL/min (by C-G formula based on SCr of 0.8 mg/dL).  Liver Function Tests: No results for input(s): "AST", "ALT", "ALKPHOS", "BILITOT", "PROT", "ALBUMIN " in the last 168 hours.  No results for input(s): "LIPASE", "AMYLASE" in the last 168 hours. Recent Labs  Lab 12/26/23 0339  AMMONIA 61*     Coagulation Profile: No results for input(s): "INR", "PROTIME" in the last 168 hours.  Cardiac Enzymes: No results for input(s): "CKTOTAL", "CKMB", "CKMBINDEX", "TROPONINI" in the last 168 hours.  BNP (last 3 results) No results for input(s): "PROBNP" in the last 8760 hours.  Lipid Profile: No results for input(s): "CHOL", "  HDL", "LDLCALC", "TRIG", "CHOLHDL", "LDLDIRECT" in the last 72 hours.  Thyroid  Function Tests: No results for input(s): "TSH", "T4TOTAL", "FREET4", "T3FREE", "THYROIDAB" in the last 72 hours.  Anemia Panel: No results for input(s): "VITAMINB12", "FOLATE", "FERRITIN", "TIBC", "IRON ", "RETICCTPCT" in the last 72 hours.  Urine analysis:    Component Value Date/Time   COLORURINE YELLOW 12/08/2023 1733   APPEARANCEUR HAZY (A) 12/08/2023 1733   APPEARANCEUR Clear 07/05/2021 1129   LABSPEC 1.019 12/08/2023 1733   PHURINE 7.0 12/08/2023 1733   GLUCOSEU NEGATIVE 12/08/2023 1733   GLUCOSEU NEGATIVE 04/22/2021 1048   HGBUR NEGATIVE 12/08/2023 1733   BILIRUBINUR NEGATIVE 12/08/2023 1733   BILIRUBINUR Negative 07/05/2021 1129   KETONESUR NEGATIVE 12/08/2023 1733   PROTEINUR NEGATIVE 12/08/2023 1733   UROBILINOGEN 0.2 04/22/2021 1048   NITRITE NEGATIVE 12/08/2023 1733   LEUKOCYTESUR NEGATIVE 12/08/2023 1733    Sepsis Labs: Lactic Acid, Venous    Component Value Date/Time   LATICACIDVEN 1.6 12/08/2023 1406    MICROBIOLOGY: No results found for this or any previous visit (from the past 240 hours).  RADIOLOGY  STUDIES/RESULTS: No results found.   LOS: 18 days   Kimberly Penna, MD  [facility]    To contact the attending provider between 7A-7P or the covering provider during after hours 7P-7A, please log into the web site [email or web address] and access using universal [place] password for that web site. If you do not have the password, please call the hospital operator.  12/26/2023, 11:10 AM

## 2023-12-26 NOTE — Progress Notes (Signed)
 Physical Therapy Treatment Patient Details Name: Tony Cantu MRN: [id number] DOB: Dec 16, 1950 Today's Date: 12/26/2023   History of Present Illness Patient is 73 yo male admitted on 12/08/23 for AMS, nausea, vomiting, and dark stool. PMHx significant for type 1 diabetes, A-fib on Eliquis , seizure disorder, aortic stenosis, HTN, peptic ulcer disease, OA with bilateral knee replacement, polyclonal gammopathy, OSA on CPAP, GERD, COPD/asthma, chronic diastolic CHF, cirrhosis, portal hypertension, and BPH.    PT Comments  Making progress towards functional goals. Tolerated transfer training today and sat EOB approx 10 minutes. Still getting dizzy but BP stabilized with time after initially sitting up *(see details below.) Reviewed LE exercises, worked on seated balance, (leaning backwards and needs intermittent min assist.) Transferred with +2 mod assist bilateral UE supported, having some retropulsion and difficulty clearing Lt foot with step pivot transfer. Overall, one of the most successful visits with had with patient so far. Patient will continue to benefit from skilled physical therapy services to further improve independence with functional mobility.   Supine pre-activity BP 118/76 (MAP 87) Sitting EOB immediately BP 73/60 (MAP 66) HR 90; After 3 min stll sitting EOB BP 99/77 (MAP 84) HR 102; after additional 5 min following LE exercises, seated BP 100/82 (MAP 90) HR 100. Post-transfer to recliner, LEs elevated BP 123/77 (MAP 91) HR 93. Patient reports moderate dizziness throughout session regardless of position.     If plan is discharge home, recommend the following: Two people to help with walking and/or transfers;A lot of help with bathing/dressing/bathroom;Assistance with cooking/housework;Direct supervision/assist for medications management;Direct supervision/assist for financial management;Assist for transportation;Supervision due to cognitive status   Can travel by private vehicle     No   Equipment Recommendations  None recommended by PT (TBD)    Recommendations for Other Services       Precautions / Restrictions Precautions Precautions: Fall Recall of Precautions/Restrictions: Impaired Precaution/Restrictions Comments: watch BP; Frequent loose stools Restrictions Weight Bearing Restrictions Per Provider Order: No     Mobility  Bed Mobility Overal bed mobility: Needs Assistance Bed Mobility: Rolling Rolling: Min assist, Used rails, Contact guard assist Sidelying to sit: Mod assist, HOB elevated       General bed mobility comments: CGA to roll to Rt side of bed, min assist to roll Lt. Cues for set up and technique while assisting with pericare due to stool incontinence in bed. Mod assist to rise to EOB. Immediately dizzy, slight improvement over time. See general information below regarding BPs.    Transfers Overall transfer level: Needs assistance Equipment used: 2 person hand held assist Transfers: Sit to/from Stand, Bed to chair/wheelchair/BSC Sit to Stand: Mod assist, +2 physical assistance, +2 safety/equipment   Step pivot transfers: Mod assist, +2 physical assistance, +2 safety/equipment       General transfer comment: Mod assist for boost to stand 3x today with +2 bil hand hand held support. Leans posteriorly. Progressed with step pivot transfer, notable difficulty clearing left foot. Mod assist +2 for step pivot and balance.    Ambulation/Gait                   Stairs             Wheelchair Mobility     Tilt Bed    Modified Rankin (Stroke Patients Only)       Balance Overall balance assessment: Needs assistance Sitting-balance support: Feet supported, Bilateral upper extremity supported Sitting balance-Leahy Scale: Poor Sitting balance - Comments: Intermittent min assist for balance Postural  control: Posterior lean Standing balance support: Bilateral upper extremity supported, Reliant on assistive device for  balance Standing balance-Leahy Scale: Poor Standing balance comment: Stood several times with bil UE support fairly heavy posterior lean                            Communication Communication Communication: No apparent difficulties  Cognition Arousal: Alert Behavior During Therapy: WFL for tasks assessed/performed   PT - Cognitive impairments: No family/caregiver present to determine baseline, Attention, Sequencing, Problem solving, Safety/Judgement, Awareness, Orientation, Memory   Orientation impairments: Situation, Time                   PT - Cognition Comments: Unsure of month/year, situation. Oriented to self and location. Following commands: Impaired Following commands impaired: Only follows one step commands consistently    Cueing Cueing Techniques: Verbal cues, Tactile cues, Gestural cues  Exercises General Exercises - Lower Extremity Ankle Circles/Pumps: AROM, Both, 10 reps, Supine Quad Sets: Strengthening, Both, 10 reps, Supine Long Arc Quad: Strengthening, Both, 20 reps, Seated Other Exercises Other Exercises: Seated balance working on forward weight shift and unsupported sitting. tolerated EOB ~ 10 minutes    General Comments General comments (skin integrity, edema, etc.): BP WNLs during session.      Pertinent Vitals/Pain Pain Assessment Pain Assessment: No/denies pain    Home Living                          Prior Function            PT Goals (current goals can now be found in the care plan section) Acute Rehab PT Goals Patient Stated Goal: Go to rehab in [place] PT Goal Formulation: Patient unable to participate in goal setting Time For Goal Achievement: 01/05/24 Potential to Achieve Goals: Fair Progress towards PT goals: Progressing toward goals    Frequency    Min 1X/week      PT Plan      Co-evaluation              AM-PAC PT "6 Clicks" Mobility   Outcome Measure  Help needed turning from your back to  your side while in a flat bed without using bedrails?: A Little Help needed moving from lying on your back to sitting on the side of a flat bed without using bedrails?: A Lot Help needed moving to and from a bed to a chair (including a wheelchair)?: Total Help needed standing up from a chair using your arms (e.g., wheelchair or bedside chair)?: Total Help needed to walk in hospital room?: Total Help needed climbing 3-5 steps with a railing? : Total 6 Click Score: 9    End of Session Equipment Utilized During Treatment: Gait belt Activity Tolerance: Patient tolerated treatment well Patient left: with call bell/phone within reach;in chair;with chair alarm set Nurse Communication:  (OT notified pt in chair, plans to assist back to bed.) PT Visit Diagnosis: Muscle weakness (generalized) (M62.81);Difficulty in walking, not elsewhere classified (R26.2);Other symptoms and signs involving the nervous system (R29.898);Dizziness and giddiness (R42) Pain - part of body:  (buttocks)     Time: 3244-0102 PT Time Calculation (min) (ACUTE ONLY): 37 min  Charges:    $Therapeutic Exercise: 8-22 mins $Therapeutic Activity: 8-22 mins PT General Charges $$ ACUTE PT VISIT: 1 Visit  Jory Ng, PT, DPT [facility]  Rehabilitation Services Physical Therapist Office: [phone] Website: [email or web address]    Alinda Irani 12/26/2023, 2:18 PM

## 2023-12-26 NOTE — Progress Notes (Signed)
   12/26/23 1045  Spiritual Encounters  Type of Visit Initial  Care provided to: Patient  Referral source Nurse (RN/NT/LPN)  Reason for visit Advance directives  OnCall Visit No   Responded to call to collect forms for HCPOA. 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 Patient is not requesting any changes from the AD completed in January 2025.   Patient was however, concerned that he has not been moved to [facility] center as he says his wife has made arrangements and a room is available. Patient wants answers as to why he hasn't been transferred yet.

## 2023-12-26 NOTE — TOC Progression Note (Signed)
 Transition of Care [facility]) - Progression Note    Patient Details  Name: Tony Cantu MRN: [id number] Date of Birth: 05-01-1951  [facility] CM/SW Contact  Jannice Mends, LCSW Phone Number: 12/26/2023, 9:11 AM  Clinical Narrative:    CSW received call from [facility] and she has obtained the financial POA for patient but now the lawyer has to file it with the county. She will keep CSW updated.   She raised concern over [facility] telling her that they now do not want to accept patient private pay. CSW will follow up with [facility] and request other SNFs review for private pay.    Expected Discharge Plan: Skilled Nursing Facility Barriers to Discharge: Continued Medical Work up, English as a second language teacher  Expected Discharge Plan and Services In-house Referral: Clinical Social Work   Post Acute Care Choice: Skilled Nursing Facility Living arrangements for the past 2 months: Single Family Home, Skilled Nursing Facility Expected Discharge Date: 12/21/23                                     Social Determinants of Health (SDOH) Interventions SDOH Screenings   Food Insecurity: Patient Unable To Answer (12/09/2023)  Housing: Patient Unable To Answer (12/09/2023)  Recent Concern: Housing - High Risk (10/30/2023)  Transportation Needs: Patient Unable To Answer (12/09/2023)  Utilities: Patient Unable To Answer (12/09/2023)  Depression (PHQ2-9): Medium Risk (09/13/2023)  Financial Resource Strain: Low Risk  (06/09/2022)  Social Connections: Patient Unable To Answer (12/09/2023)  Recent Concern: Social Connections - Socially Isolated (10/30/2023)  Tobacco Use: Medium Risk (12/08/2023)    Readmission Risk Interventions     No data to display

## 2023-12-26 NOTE — Inpatient Diabetes Management (Addendum)
 Inpatient Diabetes Program Recommendations  AACE/ADA: New Consensus Statement on Inpatient Glycemic Control (2015)  Target Ranges:  Prepandial:   less than 140 mg/dL      Peak postprandial:   less than 180 mg/dL (1-2 hours)      Critically ill patients:  140 - 180 mg/dL   Lab Results  Component Value Date   GLUCAP 265 (H) 12/26/2023   HGBA1C 4.4 (L) 09/19/2023    Latest Reference Range & Units 12/25/23 08:32 12/25/23 11:19 12/25/23 16:11 12/25/23 21:04 12/26/23 09:32  Glucose-Capillary 70 - 99 mg/dL 914 (H) 782 (H) 956 (H) 320 (H) 265 (H)  (H): Data is abnormally high  Diabetes history: DM2 Outpatient Diabetes medications: Lantus  24 units Q12H, Humalog  8 units TID with meals plus 2-15 units TID (correction) Current orders for Inpatient glycemic control: Semglee  18 units am, 12 units pm, Novolog  4 units tid meal coverage, Novolog  0-9 tid, 0-5 units hs  Inpatient Diabetes Program Recommendations:   Patient is not eating well so not able to increase meal coverage @ this time.  Thank you, Edgar Reisz E. Deshea Pooley, RN, MSN, CDCES  Diabetes Coordinator Inpatient Glycemic Control Team Team Pager [phone] (8am-5pm) 12/26/2023 11:32 AM

## 2023-12-26 NOTE — [place] FL2 (Signed)
   MEDICAID [id number] LEVEL OF CARE FORM     IDENTIFICATION  Patient Name: Tony Cantu Birthdate: February 04, 1951 Sex: male Admission Date (Current Location): 12/08/2023  [facility] and [place] Number:  [employer] and Address:  [facility], [address], [place], [place] [address]      Provider Number: [id number]  Attending Physician Name and Address:  Burton Casey, MD  Relative Name and Phone Number:       Current Level of Care: Hospital Recommended Level of Care: Skilled Nursing Facility Prior Approval Number:    Date Approved/Denied:   PASRR Number: [id number]  Discharge Plan: SNF    Current Diagnoses: Patient Active Problem List   Diagnosis Date Noted   Altered mental status 12/12/2023   Hepatic encephalopathy (HCC) 12/09/2023   ABLA (acute blood loss anemia) 12/09/2023   Acute GI bleeding 12/08/2023   Atrial fibrillation with rapid ventricular response (HCC) 10/26/2023   Protein-calorie malnutrition, severe 10/25/2023   GI bleed 10/24/2023   Hemorrhagic shock (HCC) 10/24/2023   Other cirrhosis of liver (HCC) 09/25/2023   Abnormal finding on GI tract imaging 09/25/2023   Esophageal candidiasis (HCC) 09/25/2023   Bacteremia 09/21/2023   Acute encephalopathy 09/20/2023   Transaminitis 09/20/2023   Renal lesion 09/20/2023   Melena 09/20/2023   Iron  deficiency anemia due to chronic blood loss 08/16/2023   NAFLD (nonalcoholic fatty liver disease) 56/21/3086   Portal hypertension (HCC) 08/24/2022   S/P total knee arthroplasty, left 06/06/2022   Degenerative arthritis of left knee 06/03/2022   Chronic obstructive pulmonary disease (HCC) 05/10/2022   Grade I diastolic dysfunction 03/11/2022   S/P TKR (total knee replacement), right 03/07/2022   Osteoarthritis of right knee 03/04/2022   Precordial chest pain 04/20/2021   Former smoker 01/12/2021   PAF (paroxysmal atrial fibrillation) (HCC) 06/22/2020   Hypertension  associated with type 2 diabetes mellitus (HCC) 05/17/2019   Hyperlipidemia associated with type 2 diabetes mellitus (HCC) 05/17/2019   GERD with esophagitis 05/17/2019   H/O total shoulder replacement, right 11/01/2018   Paroxysmal tachycardia (HCC) 01/11/2017   Nonrheumatic aortic valve stenosis 01/11/2017   Aortic atherosclerosis (HCC) 11/23/2016   HNP (herniated nucleus pulposus), lumbar 10/06/2016   Diarrhea 08/06/2016   Fever 08/06/2016   Hyperbilirubinemia 08/06/2016   Shock circulatory (HCC) 08/06/2016   Inguinal hernia 12/10/2015   Right groin pain 11/30/2015   Carpal tunnel syndrome on right 09/09/2015   Cervical spondylosis without myelopathy 09/09/2015   Thrombocytopenia (HCC) 07/21/2014   Bilateral carotid bruits 05/11/2014   Vitamin D  deficiency 09/17/2013   BPH (benign prostatic hyperplasia) 05/22/2013   Low serum testosterone  level 02/18/2013   Diabetes type 2, controlled (HCC) 01/10/2013   OSA (obstructive sleep apnea) 05/16/2012   Edema 02/21/2012   At risk for coronary artery disease 03/20/2011   Obesity 03/20/2011   Allergic rhinitis 06/22/2010   Asthma 06/22/2010   COUGH 06/22/2010    Orientation RESPIRATION BLADDER Height & Weight     Self, Situation, Place  Normal Incontinent, External catheter Weight: 164 lb 14.5 oz (74.8 kg) Height:  5' 7.99" (172.7 cm)  BEHAVIORAL SYMPTOMS/MOOD NEUROLOGICAL BOWEL NUTRITION STATUS      Incontinent Diet (See dc summary)  AMBULATORY STATUS COMMUNICATION OF NEEDS Skin   Extensive Assist Verbally Other (Comment) (Incontinence associated Dermatitis on sacrum;)                       Personal Care Assistance Level of Assistance  Bathing, Feeding, Dressing Bathing Assistance: Maximum assistance Feeding assistance: Limited assistance Dressing Assistance: Maximum assistance     Functional Limitations Info  Sight Sight Info: Impaired        SPECIAL CARE FACTORS FREQUENCY  PT (By licensed PT), OT (By licensed  OT)     PT Frequency: 5x/week OT Frequency: 5x/week            Contractures Contractures Info: Not present    Additional Factors Info  Code Status, Allergies, Insulin  Sliding Scale Code Status Info: DNR Allergies Info: Dim[employer]app Children's Cold-cough, Ak-mycin (Erythromycin), Sulfonamide Derivatives   Insulin  Sliding Scale Info: see dc summary       Current Medications (12/26/2023):  This is the current hospital active medication list Current Facility-Administered Medications  Medication Dose Route Frequency Provider Last Rate Last Admin   acetaminophen  (TYLENOL ) tablet 650 mg  650 mg Per Tube Q6H PRN Singh, Prashant K, MD   650 mg at 12/24/23 0342   Or   acetaminophen  (TYLENOL ) suppository 650 mg  650 mg Rectal Q6H PRN Singh, Prashant K, MD       amiodarone  (PACERONE ) tablet 100 mg  100 mg Oral Daily Elgergawy, Dawood S, MD   100 mg at 12/25/23 0981   apixaban  (ELIQUIS ) tablet 5 mg  5 mg Oral BID Elgergawy, Dawood S, MD   5 mg at 12/25/23 2121   atorvastatin  (LIPITOR) tablet 40 mg  40 mg Oral Daily Ghimire, Shanker M, MD   40 mg at 12/25/23 0910   diltiazem  (CARDIZEM ) injection 10 mg  10 mg Intravenous Q6H PRN Singh, Prashant K, MD       feeding supplement (GLUCERNA SHAKE) (GLUCERNA SHAKE) liquid 237 mL  237 mL Oral TID BM Burton Casey, MD   237 mL at 12/25/23 2121   Gerhardt's butt cream   Topical TID Elgergawy, Dawood S, MD   Given at 12/25/23 2127   insulin  aspart (novoLOG ) injection 0-5 Units  0-5 Units Subcutaneous QHS Elgergawy, Dawood S, MD   4 Units at 12/25/23 2122   insulin  aspart (novoLOG ) injection 0-9 Units  0-9 Units Subcutaneous TID WC Elgergawy, Dawood S, MD   3 Units at 12/25/23 1736   insulin  aspart (novoLOG ) injection 4 Units  4 Units Subcutaneous TID WC Burton Casey, MD   4 Units at 12/24/23 1824   insulin  glargine-yfgn (SEMGLEE ) injection 12 Units  12 Units Subcutaneous QHS Burton Casey, MD   12 Units at 12/25/23 2123   insulin  glargine-yfgn  (SEMGLEE ) injection 18 Units  18 Units Subcutaneous q morning Burton Casey, MD   18 Units at 12/25/23 0912   lactulose  (CHRONULAC ) 10 GM/15ML solution 30 g  30 g Oral TID Daina Drum, MD   30 g at 12/25/23 2124   melatonin tablet 3 mg  3 mg Oral QHS PRN Elgergawy, Dawood S, MD   3 mg at 12/20/23 2121   midodrine  (PROAMATINE ) tablet 10 mg  10 mg Oral TID WC Elgergawy, Dawood S, MD   10 mg at 12/25/23 1736   mirtazapine (REMERON SOL-TAB) disintegrating tablet 15 mg  15 mg Oral QHS Ghimire, Shanker M, MD   15 mg at 12/25/23 2121   pantoprazole  (PROTONIX ) EC tablet 40 mg  40 mg Oral BID AC Elgergawy, Dawood S, MD   40 mg at 12/25/23 1736   potassium chloride  SA (KLOR-CON  M) CR tablet 40 mEq  40 mEq Oral Once Ghimire, Shanker M, MD       rifaximin  (XIFAXAN )  tablet 550 mg  550 mg Oral BID Singh, Prashant K, MD   550 mg at 12/25/23 2122   sodium chloride  flush (NS) 0.9 % injection 3 mL  3 mL Intravenous Q12H Johnetta Nab, MD   3 mL at 12/25/23 2123     Discharge Medications: Please see discharge summary for a list of discharge medications.  Relevant Imaging Results:  Relevant Lab Results:   Additional Information [id number]  Dwanna Goshert S Shebra Muldrow, LCSW

## 2023-12-26 NOTE — Plan of Care (Signed)
   Problem: Education: Goal: Knowledge of General Education information will improve Description Including pain rating scale, medication(s)/side effects and non-pharmacologic comfort measures Outcome: Progressing   Problem: Health Behavior/Discharge Planning: Goal: Ability to manage health-related needs will improve Outcome: Progressing

## 2023-12-27 DIAGNOSIS — J449 Chronic obstructive pulmonary disease, unspecified: Secondary | ICD-10-CM | POA: Diagnosis not present

## 2023-12-27 DIAGNOSIS — K7682 Hepatic encephalopathy: Secondary | ICD-10-CM | POA: Diagnosis not present

## 2023-12-27 DIAGNOSIS — I480 Paroxysmal atrial fibrillation: Secondary | ICD-10-CM | POA: Diagnosis not present

## 2023-12-27 DIAGNOSIS — K922 Gastrointestinal hemorrhage, unspecified: Secondary | ICD-10-CM | POA: Diagnosis not present

## 2023-12-27 LAB — COMPREHENSIVE METABOLIC PANEL WITH GFR
ALT: 39 U/L (ref 0–44)
AST: 55 U/L — ABNORMAL HIGH (ref 15–41)
Albumin: 3.8 g/dL (ref 3.5–5.0)
Alkaline Phosphatase: 133 U/L — ABNORMAL HIGH (ref 38–126)
Anion gap: 10 (ref 5–15)
BUN: 7 mg/dL — ABNORMAL LOW (ref 8–23)
CO2: 24 mmol/L (ref 22–32)
Calcium: 10.7 mg/dL — ABNORMAL HIGH (ref 8.9–10.3)
Chloride: 105 mmol/L (ref 98–111)
Creatinine, Ser: 0.78 mg/dL (ref 0.61–1.24)
GFR, Estimated: >60 mL/min (ref >60)
Glucose, Bld: 157 mg/dL — ABNORMAL HIGH (ref 70–99)
Potassium: 3.9 mmol/L (ref 3.5–5.1)
Sodium: 139 mmol/L (ref 135–145)
Total Bilirubin: 1.5 mg/dL — ABNORMAL HIGH (ref 0.0–1.2)
Total Protein: 6.6 g/dL (ref 6.5–8.1)

## 2023-12-27 LAB — GLUCOSE, CAPILLARY
Glucose-Capillary: 173 mg/dL — ABNORMAL HIGH (ref 70–99)
Glucose-Capillary: 304 mg/dL — ABNORMAL HIGH (ref 70–99)
Glucose-Capillary: 361 mg/dL — ABNORMAL HIGH (ref 70–99)
Glucose-Capillary: 199 mg/dL — ABNORMAL HIGH (ref 70–99)

## 2023-12-27 LAB — MAGNESIUM: Magnesium: 1.9 mg/dL (ref 1.7–2.4)

## 2023-12-27 LAB — VITAMIN D 25 HYDROXY (VIT D DEFICIENCY, FRACTURES): Vit D, 25-Hydroxy: 62.9 ng/mL (ref 30–100)

## 2023-12-27 MED ORDER — SODIUM CHLORIDE 0.9 % IV SOLN: INTRAVENOUS | Status: AC

## 2023-12-27 NOTE — Progress Notes (Signed)
 PROGRESS NOTE        PATIENT DETAILS Name: Tony Cantu Age: 73 y.o. Sex: male Date of Birth: Oct 19, 1950 Admit Date: 12/08/2023 Admitting Physician Johnetta Nab, MD [id number], Gwendalyn Lemma, DO  Brief Summary: Patient is a 73 y.o.  male with history of [name] cirrhosis, PAF on anticoagulation who presented with somnolence/fatigue-was found to have hepatic encephalopathy along with upper GI bleeding.  Hospital course complicated by orthostatic hypotension.  Significant events: 4/11>> admit to [facility]  Significant studies: 4/11>> CT abdomen/pelvis: Mild circumferential thickening of the rectum-favoring proctitis. 4/11>> CT head: No acute intracranial abnormality.  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  Consults: GI  Subjective: Sleeping when I saw him this morning-easily aroused-claims he did eat quite a bit yesterday.  Still is very frustrated that he is still hospitalized and that it is taking quite a while for him to go to SNF.  Objective: Vitals: Blood pressure 102/62, pulse 60, temperature 97.9 F (36.6 C), temperature source Oral, resp. rate 15, height 5' 7.99" (1.727 m), weight 74.8 kg, SpO2 100%.   Exam: Awake/alert-frail/chronically sick appearing Chest: Clear to auscultation CVS: S1-S2 regular Abdomen: Soft nontender nondistended Neurology: Nonfocal but with generalized weakness.  Pertinent Labs/Radiology:    Latest Ref Rng & Units 12/26/2023    3:39 AM 12/20/2023    3:44 AM 12/18/2023    1:31 AM  CBC  WBC 4.0 - 10.5 K/uL 6.4  5.6  4.5   Hemoglobin 13.0 - 17.0 g/dL 11.9  14.7  82.9   Hematocrit 39.0 - 52.0 % 33.8  34.3  31.5   Platelets 150 - 400 K/uL 184  116  80     Lab Results  Component Value Date   NA 139 12/27/2023   K 3.9 12/27/2023   CL  105 12/27/2023   CO2 24 12/27/2023      Assessment/Plan: Hepatic encephalopathy Likely triggered by upper GI bleeding Resolved with lactulose /Xifaxan  Completely awake/alert this morning  Upper GI bleeding Secondary to AVMs S/p EGD with APC 4/16 No further bleeding-Hb stable Follow CBC Continue PPI twice daily  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  Chronic HFpEF Euvolemic on exam Diuretics as needed  Hypokalemia Repleted.  Mild hypercalcemia Unclear etiology PTH/vitamin D  levels ordered Gently hydrate with IVF Repeat electrolytes tomorrow.  PAF Telemetry monitoring Amiodarone  Eliquis .  HLD Continue to hold statins-trend LFTs.  Transaminitis Unclear etiology Downtrending Follow-remains on amiodarone -hence watch closely.  COPD/asthma Stable Bronchodilators  DM-2 (A1c 4.4 on 1/21) CBGs has now stabilized Allow some amount of permissive hyperglycemia given erratic oral intake. Continue Semglee  18 units in a.m. Continue Semglee  12 units in p.m. Scheduled NovoLog  4 units with meals SSI Follow/optimize-reassess on 4/30.   Recent Labs    12/26/23 1720 12/26/23 2120 12/27/23 0848  GLUCAP 113* 157* 173*    Failure to thrive syndrome Poor oral intake Continue PT/OT/nutrition eval Remeron started Appears somewhat depressed-but per patient this is primarily due to his frustration of remaining in the hospital.  He does not want to talk to a psychiatrist.  Debility/deconditioning Due to acute illness PT/OT eval-SNF recommended.  Code status:   Code Status: Limited: Do not attempt resuscitation (DNR) -DNR-LIMITED -Do Not Intubate/DNI    DVT Prophylaxis: Place TED hose Start: 12/14/23 1312 Place and maintain sequential compression device  Start: 12/09/23 0555 SCDs Start: 12/08/23 1707 apixaban  (ELIQUIS ) tablet 5 mg    Family Communication: None at bedside   Disposition Plan: Status is: Inpatient Remains inpatient appropriate because: Severity of illness   Planned Discharge Destination:Skilled nursing facility   Diet: Diet Order             Diet regular Room service appropriate? Yes; Fluid consistency: Thin  Diet effective now           Diet - low sodium heart healthy           Diet Carb Modified                     Antimicrobial agents: Anti-infectives (From admission, onward)    Start     Dose/Rate Route Frequency Ordered Stop   12/21/23 0000  rifaximin  (XIFAXAN ) 550 MG TABS tablet        550 mg Oral 2 times daily 12/21/23 0826     12/10/23 2200  rifaximin  (XIFAXAN ) tablet 550 mg        550 mg Oral 2 times daily 12/10/23 1815     12/09/23 1000  rifaximin  (XIFAXAN ) tablet 550 mg  Status:  Discontinued        550 mg Per Tube 2 times daily 12/09/23 0827 12/10/23 1815        MEDICATIONS: Scheduled Meds:  amiodarone   100 mg Oral Daily   apixaban   5 mg Oral BID   atorvastatin   40 mg Oral Daily   feeding supplement (GLUCERNA SHAKE)  237 mL Oral TID BM   Gerhardt's butt cream   Topical TID   insulin  aspart  0-5 Units Subcutaneous QHS   insulin  aspart  0-9 Units Subcutaneous TID WC   insulin  aspart  4 Units Subcutaneous TID WC   insulin  glargine-yfgn  12 Units Subcutaneous QHS   insulin  glargine-yfgn  18 Units Subcutaneous q morning   lactulose   30 g Oral TID   midodrine   10 mg Oral TID WC   mirtazapine  15 mg Oral QHS   pantoprazole   40 mg Oral BID AC   rifaximin   550 mg Oral BID   sodium chloride  flush  3 mL Intravenous Q12H   Continuous Infusions:  sodium chloride  50 mL/hr at 12/27/23 0857    PRN Meds:.acetaminophen  **OR** acetaminophen , diltiazem , melatonin   I have personally reviewed following labs and imaging studies  LABORATORY DATA: CBC: Recent Labs  Lab 12/26/23 0339   WBC 6.4  HGB 11.5*  HCT 33.8*  MCV 102.1*  PLT 184    Basic Metabolic Panel: Recent Labs  Lab 12/26/23 0339 12/27/23 0336  NA 137 139  K 3.2* 3.9  CL 102 105  CO2 25 24  GLUCOSE 313* 157*  BUN 7* 7*  CREATININE 0.80 0.78  CALCIUM  10.8* 10.7*  MG  --  1.9     GFR: Estimated Creatinine Clearance: 78.4 mL/min (by C-G formula based on SCr of 0.78 mg/dL).  Liver Function Tests: Recent Labs  Lab 12/27/23 0336  AST 55*  ALT 39  ALKPHOS 133*  BILITOT 1.5*  PROT 6.6  ALBUMIN  3.8    No results for input(s): "LIPASE", "AMYLASE" in the last 168 hours. Recent Labs  Lab 12/26/23 0339  AMMONIA 61*     Coagulation Profile:  No results for input(s): "INR", "PROTIME" in the last 168 hours.  Cardiac Enzymes: No results for input(s): "CKTOTAL", "CKMB", "CKMBINDEX", "TROPONINI" in the last 168 hours.  BNP (last 3 results) No results for input(s): "PROBNP" in the last 8760 hours.  Lipid Profile: No results for input(s): "CHOL", "HDL", "LDLCALC", "TRIG", "CHOLHDL", "LDLDIRECT" in the last 72 hours.  Thyroid  Function Tests: No results for input(s): "TSH", "T4TOTAL", "FREET4", "T3FREE", "THYROIDAB" in the last 72 hours.  Anemia Panel: No results for input(s): "VITAMINB12", "FOLATE", "FERRITIN", "TIBC", "IRON ", "RETICCTPCT" in the last 72 hours.  Urine analysis:    Component Value Date/Time   COLORURINE YELLOW 12/08/2023 1733   APPEARANCEUR HAZY (A) 12/08/2023 1733   APPEARANCEUR Clear 07/05/2021 1129   LABSPEC 1.019 12/08/2023 1733   PHURINE 7.0 12/08/2023 1733   GLUCOSEU NEGATIVE 12/08/2023 1733   GLUCOSEU NEGATIVE 04/22/2021 1048   HGBUR NEGATIVE 12/08/2023 1733   BILIRUBINUR NEGATIVE 12/08/2023 1733   BILIRUBINUR Negative 07/05/2021 1129   KETONESUR NEGATIVE 12/08/2023 1733   PROTEINUR NEGATIVE 12/08/2023 1733   UROBILINOGEN 0.2 04/22/2021 1048   NITRITE NEGATIVE 12/08/2023 1733   LEUKOCYTESUR NEGATIVE 12/08/2023 1733    Sepsis Labs: Lactic Acid,  Venous    Component Value Date/Time   LATICACIDVEN 1.6 12/08/2023 1406    MICROBIOLOGY: No results found for this or any previous visit (from the past 240 hours).  RADIOLOGY STUDIES/RESULTS: No results found.   LOS: 19 days   Kimberly Penna, MD  [facility]    To contact the attending provider between 7A-7P or the covering provider during after hours 7P-7A, please log into the web site [email or web address] and access using universal [place] password for that web site. If you do not have the password, please call the hospital operator.  12/27/2023, 11:19 AM

## 2023-12-27 NOTE — Plan of Care (Signed)
  Problem: Clinical Measurements: Goal: Diagnostic test results will improve Outcome: Progressing Goal: Respiratory complications will improve Outcome: Progressing   Problem: Nutrition: Goal: Adequate nutrition will be maintained Outcome: Progressing   Problem: Coping: Goal: Level of anxiety will decrease Outcome: Progressing   Problem: Elimination: Goal: Will not experience complications related to bowel motility Outcome: Progressing

## 2023-12-27 NOTE — Plan of Care (Signed)
  Problem: Clinical Measurements: Goal: Ability to maintain clinical measurements within normal limits will improve Outcome: Progressing Goal: Will remain free from infection Outcome: Progressing   Problem: Activity: Goal: Risk for activity intolerance will decrease Outcome: Progressing   Problem: Skin Integrity: Goal: Risk for impaired skin integrity will decrease Outcome: Progressing   

## 2023-12-28 DIAGNOSIS — J449 Chronic obstructive pulmonary disease, unspecified: Secondary | ICD-10-CM | POA: Diagnosis not present

## 2023-12-28 DIAGNOSIS — K7682 Hepatic encephalopathy: Secondary | ICD-10-CM | POA: Diagnosis not present

## 2023-12-28 DIAGNOSIS — I480 Paroxysmal atrial fibrillation: Secondary | ICD-10-CM | POA: Diagnosis not present

## 2023-12-28 DIAGNOSIS — K922 Gastrointestinal hemorrhage, unspecified: Secondary | ICD-10-CM | POA: Diagnosis not present

## 2023-12-28 LAB — BASIC METABOLIC PANEL WITH GFR
Anion gap: 12 (ref 5–15)
BUN: 7 mg/dL — ABNORMAL LOW (ref 8–23)
CO2: 22 mmol/L (ref 22–32)
Calcium: 10.7 mg/dL — ABNORMAL HIGH (ref 8.9–10.3)
Chloride: 106 mmol/L (ref 98–111)
Creatinine, Ser: 0.74 mg/dL (ref 0.61–1.24)
GFR, Estimated: >60 mL/min (ref >60)
Glucose, Bld: 262 mg/dL — ABNORMAL HIGH (ref 70–99)
Potassium: 3.3 mmol/L — ABNORMAL LOW (ref 3.5–5.1)
Sodium: 140 mmol/L (ref 135–145)

## 2023-12-28 LAB — GLUCOSE, CAPILLARY
Glucose-Capillary: 246 mg/dL — ABNORMAL HIGH (ref 70–99)
Glucose-Capillary: 254 mg/dL — ABNORMAL HIGH (ref 70–99)
Glucose-Capillary: 382 mg/dL — ABNORMAL HIGH (ref 70–99)
Glucose-Capillary: 289 mg/dL — ABNORMAL HIGH (ref 70–99)

## 2023-12-28 LAB — PARATHYROID HORMONE, INTACT (NO CA): PTH: 8 pg/mL — ABNORMAL LOW (ref 15–65)

## 2023-12-28 LAB — CALCITRIOL (1,25 DI-OH VIT D): Vit D, 1,25-Dihydroxy: 22.4 pg/mL — ABNORMAL LOW (ref 24.8–81.5)

## 2023-12-28 MED ORDER — INSULIN GLARGINE-YFGN 100 UNIT/ML ~~LOC~~ SOLN
14.0000 [IU] | Freq: Every day | SUBCUTANEOUS | Status: DC
  Administered 2023-12-28 – 2023-12-29 (×2): 14 [IU] via SUBCUTANEOUS
  Filled 2023-12-28 (×3): qty 0.14

## 2023-12-28 MED ORDER — INSULIN GLARGINE-YFGN 100 UNIT/ML ~~LOC~~ SOLN
20.0000 [IU] | Freq: Every morning | SUBCUTANEOUS | Status: DC
  Filled 2023-12-28: qty 0.2

## 2023-12-28 MED ORDER — ACETAMINOPHEN 650 MG RE SUPP: 650.0000 mg | Freq: Four times a day (QID) | RECTAL | Status: DC | PRN

## 2023-12-28 MED ORDER — POTASSIUM CHLORIDE CRYS ER 20 MEQ PO TBCR
40.0000 meq | EXTENDED_RELEASE_TABLET | Freq: Once | ORAL | Status: AC
  Administered 2023-12-28: 40 meq via ORAL
  Filled 2023-12-28: qty 2

## 2023-12-28 MED ORDER — ACETAMINOPHEN 325 MG PO TABS
650.0000 mg | ORAL_TABLET | Freq: Four times a day (QID) | ORAL | Status: DC | PRN
  Administered 2023-12-28 – 2024-01-07 (×5): 650 mg via ORAL
  Filled 2023-12-28 (×5): qty 2

## 2023-12-28 NOTE — Plan of Care (Signed)
  Problem: Clinical Measurements: Goal: Ability to maintain clinical measurements within normal limits will improve Outcome: Progressing Goal: Will remain free from infection Outcome: Progressing   Problem: Activity: Goal: Risk for activity intolerance will decrease Outcome: Progressing   Problem: Coping: Goal: Level of anxiety will decrease Outcome: Progressing   

## 2023-12-28 NOTE — TOC Progression Note (Signed)
 Transition of Care [facility]) - Progression Note    Patient Details  Name: Tony Cantu MRN: [id number] Date of Birth: April 10, 1951  [facility]) CM/SW Contact  Jannice Mends, LCSW Phone Number: 12/28/2023, 9:07 AM  Clinical Narrative:    No private pay beds available at [facility] as requested by patient's POA. Continuing to locate private pay bed.    Expected Discharge Plan: Skilled Nursing Facility Barriers to Discharge: Continued Medical Work up, English as a second language teacher  Expected Discharge Plan and Services In-house Referral: Clinical Social Work   Post Acute Care Choice: Skilled Nursing Facility Living arrangements for the past 2 months: Single Family Home, Skilled Nursing Facility Expected Discharge Date: 12/21/23                                     Social Determinants of Health (SDOH) Interventions SDOH Screenings   Food Insecurity: Patient Unable To Answer (12/09/2023)  Housing: Patient Unable To Answer (12/09/2023)  Recent Concern: Housing - High Risk (10/30/2023)  Transportation Needs: Patient Unable To Answer (12/09/2023)  Utilities: Patient Unable To Answer (12/09/2023)  Depression (PHQ2-9): Medium Risk (09/13/2023)  Financial Resource Strain: Low Risk  (06/09/2022)  Social Connections: Patient Unable To Answer (12/09/2023)  Recent Concern: Social Connections - Socially Isolated (10/30/2023)  Tobacco Use: Medium Risk (12/08/2023)    Readmission Risk Interventions     No data to display

## 2023-12-28 NOTE — Plan of Care (Signed)
  Problem: Clinical Measurements: Goal: Ability to maintain clinical measurements within normal limits will improve Outcome: Progressing Goal: Will remain free from infection Outcome: Progressing Goal: Diagnostic test results will improve Outcome: Progressing   Problem: Activity: Goal: Risk for activity intolerance will decrease Outcome: Progressing   Problem: Nutrition: Goal: Adequate nutrition will be maintained Outcome: Progressing   Problem: Coping: Goal: Level of anxiety will decrease Outcome: Progressing   Problem: Elimination: Goal: Will not experience complications related to bowel motility Outcome: Progressing   Problem: Pain Managment: Goal: General experience of comfort will improve and/or be controlled Outcome: Progressing   Problem: Skin Integrity: Goal: Risk for impaired skin integrity will decrease Outcome: Progressing

## 2023-12-28 NOTE — Progress Notes (Addendum)
   12/28/23 0935  Mobility  Activity Transferred from bed to chair  Level of Assistance +2 (takes two people) Engineer, maintenance (IT))  Assistive Device Front wheel walker  Activity Response Tolerated fair  Mobility Referral Yes  Mobility visit 1 Mobility  Mobility Specialist Start Time (ACUTE ONLY) 0935  Mobility Specialist Stop Time (ACUTE ONLY) 0959  Mobility Specialist Time Calculation (min) (ACUTE ONLY) 24 min   Mobility Specialist: Progress Note  Pre-Mobility:      HR 71, BP  108/63 (73)  During Mobility:              BP 77/65 (71) Post-Mobility:    HR 85, BP 107/77 (85)  Pt agreeable to mobility session - received in bed. C/o progressed dizziness at rest and throughout session and mid back discomfort. Returned to chair with all needs met - call bell within reach. RN present.   Isla Mari, BS Mobility Specialist Please contact via SecureChat or  Rehab office at [phone].

## 2023-12-28 NOTE — Progress Notes (Signed)
 Nutrition Follow-up  DOCUMENTATION CODES:  Severe malnutrition in context of chronic illness  INTERVENTION:  Glucerna Shake po TID, each supplement provides 220 kcal and 10 grams of protein MVI w/ minerals Liberalize diet to encourage PO intake  NUTRITION DIAGNOSIS:  Severe Malnutrition related to chronic illness (NASH cirrhosis, T1DM, HTN) as evidenced by severe muscle depletion, severe fat depletion, percent weight loss. - remains applicable  GOAL:  Patient will meet greater than or equal to 90% of their needs - progressing  MONITOR:  PO intake, Supplement acceptance, Weight trends  REASON FOR ASSESSMENT:  Consult Assessment of nutrition requirement/status  ASSESSMENT:   Pt with PMH significant for NASH cirrhosis, PAF, GERD, T1DM, HTN, arthritis who presented with somnolence/fatigue and was found to have hepatic encephalopathy along with upper GIB. Hospitalization c/b orthostatic hypotension.  4/11 admitted 4/11 CT abd/pelvis: mild circumferential thickening of rectum favoring proctitis 4/16 EGD: portal hypertensive gastropathy, single AVM-s/p APC, GAVE-s/p APC, 2 bleeding angioectasias in duodenum  Pt is medically stable for discharge. Still attempting bed placement.  Average Meal Intake 4/23: 50% x2 documented meals 4/30: 50% x1 documented meal  Pt endorses increased intake/appetite. Glucerna remains ordered to augment intake.  Admit Weight: 75.1kg Current Weight: 74.8kg  Wt stable from admission. No edema on exam. Bowels stable.  Drains/Lines: Urinary catheter UOP: 1.8L x24 hours   Permissive hyperglycemia, per MD, with his recent hx of erratic oral intake. Increased Semglee  and added Novolog  w/ meals. Monitoring closely to prevent hypoglycemia.   Calcium  trending up. IVFs administered, PTH and vitamin D  levels ordered. Nighttime Semglee  decreased and morning dose of 18 units added over last week. Mirtazapine  also initiated. Potassium supplementation in place.    Meds: SSI 0-5 at bedtime, SSI 0-9 TID, SSI 4 TID, Semglee  12 BID, Semglee  18 every morning, lactulose , pantoprazole , mirtazapine , KCl     Labs:   Na+ 140 (wdl) K+ 3.2>3.9>3.3 (L) Ca 10.7 (H)  CBGs 157-262 x24 hours A1c 4.4 (08/2023)    Diet Order:   Diet Order             Diet regular Room service appropriate? Yes; Fluid consistency: Thin  Diet effective now           Diet - low sodium heart healthy           Diet Carb Modified             EDUCATION NEEDS:  Not appropriate for education at this time  Skin:  Skin Assessment: Skin Integrity Issues: Skin Integrity Issues:: Other (Comment) Other: MASD to mid sacrum  Last BM:  4/30 - type 6 x1  Height:  Ht Readings from Last 1 Encounters:  12/08/23 5' 7.99" (1.727 m)   Weight:  Wt Readings from Last 1 Encounters:  12/23/23 74.8 kg   Ideal Body Weight:  70 kg  BMI:  Body mass index is 25.08 kg/m.  Estimated Nutritional Needs:   Kcal:  1800-2000kcal  Protein:  85-100g  Fluid:  >1.8L/day  Con Decant MS, RD, LDN Registered Dietitian Clinical Nutrition RD Inpatient Contact Info in Amion

## 2023-12-28 NOTE — Progress Notes (Signed)
 PROGRESS NOTE        PATIENT DETAILS Name: Tony Cantu Age: 73 y.o. Sex: male Date of Birth: 1951-05-17 Admit Date: 12/08/2023 Admitting Physician Johnetta Nab, MD BJY:[id number], Gwendalyn Lemma, DO  Brief Summary: Patient is a 73 y.o.  male with history of [name] cirrhosis, PAF on anticoagulation who presented with somnolence/fatigue-was found to have hepatic encephalopathy along with upper GI bleeding.  Hospital course complicated by orthostatic hypotension.  Significant events: 4/11>> admit to [facility]  Significant studies: 4/11>> CT abdomen/pelvis: Mild circumferential thickening of the rectum-favoring proctitis. 4/11>> CT head: No acute intracranial abnormality.  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  Consults: GI  Subjective: No major overnight-lying comfortably in bed.  Claims he did eat a quite a bit of food/supplements yesterday.  Having almost daily bowel movements per patient.  Objective: Vitals: Blood pressure 98/76, pulse 73, temperature 97.8 F (36.6 C), temperature source Oral, resp. rate 19, height 5' 7.99" (1.727 m), weight 74.8 kg, SpO2 97%.   Exam: Awake/alert-frail/chronically sick appearing Chest: Clear to auscultation CVS: S1-S2 regular Abdomen: Soft nontender nondistended Neurology: Nonfocal but with generalized weakness.  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  Chronic HFpEF Euvolemic on exam Diuretics as needed  Hypokalemia Repleted.  Mild hypercalcemia Unclear etiology but suspect this is from immobilization PTH/vitamin D  levels ordered and currently pending. Continue to watch closely-if worsens-will give 1 dose of bisphosphonates. Discussed with nursing staff-patient needs daily mobilization-even out of bed to chair.  PAF Telemetry monitoring Amiodarone  Eliquis .  HLD Continue to hold statins-trend LFTs.  Transaminitis Unclear etiology Downtrending Follow-remains on amiodarone -hence watch closely.  COPD/asthma Stable Bronchodilators  DM-2 (A1c 4.4 on 1/21) CBGs on the higher side Had hypoglycemia earlier-plan is to allow some amount of permissive hyperglycemia given for erratic oral intake However CBGs are high today Increase Semglee  to 20 units in a.m. Increase Semglee  to 14 units in p.m. Continue scheduled Premeal meal NovoLog -4 units Follow/optimize-reassess on 5/2   Recent Labs    12/27/23 1607 12/27/23 2038 12/28/23 0817  GLUCAP 361* 199* 246*    Failure to thrive syndrome Poor oral intake Continue PT/OT/nutrition eval Remeron  started Appears somewhat depressed-but per patient this is primarily due to his frustration  of remaining in the hospital.  He does not want to talk to a psychiatrist.  Debility/deconditioning Due to acute illness PT/OT eval-SNF recommended.  Code  status:   Code Status: Limited: Do not attempt resuscitation (DNR) -DNR-LIMITED -Do Not Intubate/DNI    DVT Prophylaxis: Place TED hose Start: 12/14/23 1312 Place and maintain sequential compression device Start: 12/09/23 0555 SCDs Start: 12/08/23 1707 apixaban  (ELIQUIS ) tablet 5 mg    Family Communication: None at bedside   Disposition Plan: Status is: Inpatient Remains inpatient appropriate because: Severity of illness   Planned Discharge Destination:Skilled nursing facility   Diet: Diet Order             Diet regular Room service appropriate? Yes; Fluid consistency: Thin  Diet effective now           Diet - low sodium heart healthy           Diet Carb Modified                     Antimicrobial agents: Anti-infectives (From admission, onward)    Start     Dose/Rate Route Frequency Ordered Stop   12/21/23 0000  rifaximin  (XIFAXAN ) 550 MG TABS tablet        550 mg Oral 2 times daily 12/21/23 0826     12/10/23 2200  rifaximin  (XIFAXAN ) tablet 550 mg        550 mg Oral 2 times daily 12/10/23 1815     12/09/23 1000  rifaximin  (XIFAXAN ) tablet 550 mg  Status:  Discontinued        550 mg Per Tube 2 times daily 12/09/23 0827 12/10/23 1815        MEDICATIONS: Scheduled Meds:  amiodarone   100 mg Oral Daily   apixaban   5 mg Oral BID   atorvastatin   40 mg Oral Daily   feeding supplement (GLUCERNA SHAKE)  237 mL Oral TID BM   Gerhardt's butt cream   Topical TID   insulin  aspart  0-5 Units Subcutaneous QHS   insulin  aspart  0-9 Units Subcutaneous TID WC   insulin  aspart  4 Units Subcutaneous TID WC   insulin  glargine-yfgn  12 Units Subcutaneous QHS   insulin  glargine-yfgn  18 Units Subcutaneous q morning   lactulose   30 g Oral TID   midodrine   10 mg Oral TID WC   mirtazapine   15 mg Oral QHS   pantoprazole   40 mg Oral BID AC   rifaximin   550 mg Oral BID   sodium chloride  flush  3 mL Intravenous Q12H   Continuous Infusions:    PRN Meds:.acetaminophen   **OR** acetaminophen , diltiazem , melatonin   I have personally reviewed following labs and imaging studies  LABORATORY DATA: CBC: Recent Labs  Lab 12/26/23 0339  WBC 6.4  HGB 11.5*  HCT 33.8*  MCV 102.1*  PLT 184    Basic Metabolic Panel: Recent Labs  Lab 12/26/23 0339 12/27/23 0336 12/28/23 0446  NA 137 139 140  K 3.2* 3.9 3.3*  CL 102 105 106  CO2 25 24 22   GLUCOSE 313* 157* 262*  BUN 7* 7* 7*  CREATININE 0.80 0.78 0.74  CALCIUM  10.8* 10.7* 10.7*  MG  --  1.9  --      GFR: Estimated Creatinine Clearance: 78.4 mL/min (by C-G formula based on SCr of 0.74 mg/dL).  Liver Function Tests: Recent Labs  Lab 12/27/23 0336  AST 55*  ALT 39  ALKPHOS 133*  BILITOT 1.5*  PROT 6.6  ALBUMIN   3.8    No results for input(s): "LIPASE", "AMYLASE" in the last 168 hours. Recent Labs  Lab 12/26/23 0339  AMMONIA 61*     Coagulation Profile: No results for input(s): "INR", "PROTIME" in the last 168 hours.  Cardiac Enzymes: No results for input(s): "CKTOTAL", "CKMB", "CKMBINDEX", "TROPONINI" in the last 168 hours.  BNP (last 3 results) No results for input(s): "PROBNP" in the last 8760 hours.  Lipid Profile: No results for input(s): "CHOL", "HDL", "LDLCALC", "TRIG", "CHOLHDL", "LDLDIRECT" in the last 72 hours.  Thyroid  Function Tests: No results for input(s): "TSH", "T4TOTAL", "FREET4", "T3FREE", "THYROIDAB" in the last 72 hours.  Anemia Panel: No results for input(s): "VITAMINB12", "FOLATE", "FERRITIN", "TIBC", "IRON ", "RETICCTPCT" in the last 72 hours.  Urine analysis:    Component Value Date/Time   COLORURINE YELLOW 12/08/2023 1733   APPEARANCEUR HAZY (A) 12/08/2023 1733   APPEARANCEUR Clear 07/05/2021 1129   LABSPEC 1.019 12/08/2023 1733   PHURINE 7.0 12/08/2023 1733   GLUCOSEU NEGATIVE 12/08/2023 1733   GLUCOSEU NEGATIVE 04/22/2021 1048   HGBUR NEGATIVE 12/08/2023 1733   BILIRUBINUR NEGATIVE 12/08/2023 1733   BILIRUBINUR Negative 07/05/2021 1129    KETONESUR NEGATIVE 12/08/2023 1733   PROTEINUR NEGATIVE 12/08/2023 1733   UROBILINOGEN 0.2 04/22/2021 1048   NITRITE NEGATIVE 12/08/2023 1733   LEUKOCYTESUR NEGATIVE 12/08/2023 1733    Sepsis Labs: Lactic Acid, Venous    Component Value Date/Time   LATICACIDVEN 1.6 12/08/2023 1406    MICROBIOLOGY: No results found for this or any previous visit (from the past 240 hours).  RADIOLOGY STUDIES/RESULTS: No results found.   LOS: 20 days   Kimberly Penna, MD  [facility]    To contact the attending provider between 7A-7P or the covering provider during after hours 7P-7A, please log into the web site [email or web address] and access using universal [place] password for that web site. If you do not have the password, please call the hospital operator.  12/28/2023, 11:44 AM

## 2023-12-29 DIAGNOSIS — J449 Chronic obstructive pulmonary disease, unspecified: Secondary | ICD-10-CM | POA: Diagnosis not present

## 2023-12-29 DIAGNOSIS — I480 Paroxysmal atrial fibrillation: Secondary | ICD-10-CM | POA: Diagnosis not present

## 2023-12-29 DIAGNOSIS — K7682 Hepatic encephalopathy: Secondary | ICD-10-CM | POA: Diagnosis not present

## 2023-12-29 DIAGNOSIS — K922 Gastrointestinal hemorrhage, unspecified: Secondary | ICD-10-CM | POA: Diagnosis not present

## 2023-12-29 LAB — GLUCOSE, CAPILLARY
Glucose-Capillary: 171 mg/dL — ABNORMAL HIGH (ref 70–99)
Glucose-Capillary: 172 mg/dL — ABNORMAL HIGH (ref 70–99)
Glucose-Capillary: 353 mg/dL — ABNORMAL HIGH (ref 70–99)
Glucose-Capillary: 185 mg/dL — ABNORMAL HIGH (ref 70–99)

## 2023-12-29 LAB — BASIC METABOLIC PANEL WITH GFR
Anion gap: 8 (ref 5–15)
BUN: 7 mg/dL — ABNORMAL LOW (ref 8–23)
CO2: 24 mmol/L (ref 22–32)
Calcium: 10.6 mg/dL — ABNORMAL HIGH (ref 8.9–10.3)
Chloride: 107 mmol/L (ref 98–111)
Creatinine, Ser: 0.77 mg/dL (ref 0.61–1.24)
GFR, Estimated: >60 mL/min (ref >60)
Glucose, Bld: 199 mg/dL — ABNORMAL HIGH (ref 70–99)
Potassium: 3.6 mmol/L (ref 3.5–5.1)
Sodium: 139 mmol/L (ref 135–145)

## 2023-12-29 LAB — CALCIUM, IONIZED: Calcium, Ionized, Serum: 5.8 mg/dL — ABNORMAL HIGH (ref 4.5–5.6)

## 2023-12-29 MED ORDER — INSULIN GLARGINE-YFGN 100 UNIT/ML ~~LOC~~ SOLN
24.0000 [IU] | Freq: Every morning | SUBCUTANEOUS | Status: DC
  Administered 2023-12-29 – 2024-01-03 (×6): 24 [IU] via SUBCUTANEOUS
  Filled 2023-12-29 (×6): qty 0.24

## 2023-12-29 MED ORDER — MEDIHONEY WOUND/BURN DRESSING EX PSTE
1.0000 | PASTE | Freq: Every day | CUTANEOUS | Status: DC
  Administered 2023-12-29 – 2024-01-11 (×14): 1 via TOPICAL
  Filled 2023-12-29 (×2): qty 44

## 2023-12-29 MED ORDER — INSULIN ASPART 100 UNIT/ML IJ SOLN
6.0000 [IU] | Freq: Three times a day (TID) | INTRAMUSCULAR | Status: DC
  Administered 2023-12-30 – 2024-01-06 (×16): 6 [IU] via SUBCUTANEOUS

## 2023-12-29 NOTE — Consult Note (Signed)
 WOC team previously consulted for MASD, see consult 4/16; reconsulted today for Stage 3 Pressure Injury Sacrum   WOC Nurse Consult Note: Reason for Consult: Stage 3 Pressure Injury Sacrum  Wound type: Stage 3 Pressure Injury Sacrum  Pressure Injury POA: no Measurement: see nursing flowsheet  Wound bed: 60% tan slough 40% red  Drainage (amount, consistency, odor) see nursing flowsheet   Periwound:intact  Dressing procedure/placement/frequency: Cleanse sacral wound with Vashe wound cleanser Timm Foot [phone]), do not rinse and allow to air dry. Apply Medihoney to wound bed daily, cover with dry gauze and silicone foam.    POC discussed with bedside nurse.  WOC team will follow weekly to assess area and change POC as needed.   Thank you,    Ronni Colace MSN, RN-BC, [employer] [phone]

## 2023-12-29 NOTE — TOC Progression Note (Addendum)
 Transition of Care [facility]) - Progression Note    Patient Details  Name: Tony Cantu MRN: [id number] Date of Birth: [birth year]  [facility] CM/SW Contact  Jannice Mends, LCSW Phone Number: 12/29/2023, 9:51 AM  Clinical Narrative:    9:51am-CSW provided private pay bed offers to [facility]. She is going to tour and check in with the lawyer to make sure the POA was filed.   2pm-CSW left voicemail for [name] requesting that she try to tour [facility] as soon as possible as they only have one room left.   Expected Discharge Plan: Skilled Nursing Facility Barriers to Discharge: Continued Medical Work up, English as a second language teacher  Expected Discharge Plan and Services In-house Referral: Clinical Social Work   Post Acute Care Choice: Skilled Nursing Facility Living arrangements for the past 2 months: Single Family Home, Skilled Nursing Facility Expected Discharge Date: 12/21/23                                     Social Determinants of Health (SDOH) Interventions SDOH Screenings   Food Insecurity: Patient Unable To Answer (12/09/2023)  Housing: Patient Unable To Answer (12/09/2023)  Recent Concern: Housing - High Risk (10/30/2023)  Transportation Needs: Patient Unable To Answer (12/09/2023)  Utilities: Patient Unable To Answer (12/09/2023)  Depression (PHQ2-9): Medium Risk (09/13/2023)  Financial Resource Strain: Low Risk  (06/09/2022)  Social Connections: Patient Unable To Answer (12/09/2023)  Recent Concern: Social Connections - Socially Isolated (10/30/2023)  Tobacco Use: Medium Risk (12/08/2023)    Readmission Risk Interventions     No data to display

## 2023-12-29 NOTE — Progress Notes (Signed)
 Patient stated he uses a CPAP at home but refuses to use one here at hospital.  No distress noted at this time, will continue to monitor.

## 2023-12-29 NOTE — Progress Notes (Addendum)
 PROGRESS NOTE        PATIENT DETAILS Name: Tony Cantu Age: 73 y.o. Sex: male Date of Birth: Dec 22, 1950 Admit Date: 12/08/2023 Admitting Physician Johnetta Nab, MD [id number], Gwendalyn Lemma, DO  Brief Summary: Patient is a 73 y.o.  male with history of [name] cirrhosis, PAF on anticoagulation who presented with somnolence/fatigue-was found to have hepatic encephalopathy along with upper GI bleeding.  Hospital course complicated by orthostatic hypotension.  Significant events: 4/11>> admit to [facility]  Significant studies: 4/11>> CT abdomen/pelvis: Mild circumferential thickening of the rectum-favoring proctitis. 4/11>> CT head: No acute intracranial abnormality. 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  Consults: GI  Subjective: No major issues overnight.  Encouraged patient to move around as much as he can.  Objective: Vitals: Blood pressure 117/64, pulse 66, temperature 97.7 F (36.5 C), temperature source Oral, resp. rate 17, height 5' 7.99" (1.727 m), weight 74.8 kg, SpO2 99%.   Exam: Awake/alert-but frail/chronically sick appearing Abdomen is soft nontender nondistended Extremities no edema Nonfocal exam but has diffuse generalized weakness.    Pertinent Labs/Radiology:    Latest Ref Rng & Units 12/26/2023    3:39 AM 12/20/2023    3:44 AM 12/18/2023    1:31 AM  CBC  WBC 4.0 - 10.5 K/uL 6.4  5.6  4.5   Hemoglobin 13.0 - 17.0 g/dL 56.2  13.0  86.5   Hematocrit 39.0 - 52.0 % 33.8  34.3  31.5   Platelets 150 - 400 K/uL 184  116  80     Lab Results  Component Value Date   NA 139 12/29/2023   K 3.6 12/29/2023   CL 107 12/29/2023   CO2 24  12/29/2023     Assessment/Plan: Hepatic encephalopathy Likely triggered by upper GI bleeding Resolved with lactulose /Xifaxan  Completely awake/alert this morning  Upper GI bleeding Secondary to AVMs S/p EGD with APC 4/16 No further bleeding-Hb stable Follow CBC Continue PPI twice daily  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  Chronic HFpEF Euvolemic on exam Diuretics as needed  Hypokalemia Repleted.  Mild hypercalcemia Unclear etiology but suspect this is from immobilization-as he has been very reluctant to move for for the past a week or so.  Do not see any culprit medications PTH levels appropriately suppressed Thankfully-this is very mild hypercalcemia-he is asymptomatic-we will watch him closely-SNF bed is pending and will take several days.  Have discussed with PT/OT and with the mobility tech-they will try and work with nursing staff and see him more frequently. Patient was counseled extensively regarding the importance of mobilization/moving around. Will check calcium  levels every 48 hours while he is hospitalized and if worsens-we can consider 1 dose of bisphosphonates if it gets worse.  Will need outpatient endocrine referral if this does not resolve with mobilization.  PAF Telemetry monitoring Amiodarone  Eliquis .  HLD Continue to hold statins-trend LFTs.  Transaminitis Unclear etiology Downtrending Follow-remains on amiodarone -hence watch closely.  COPD/asthma Stable Bronchodilators  DM-2 (A1c 4.4 on 1/21) CBGs on  the higher side Had hypoglycemia earlier-plan is to allow some amount of permissive hyperglycemia given for erratic oral intake CBGs continue to be on the higher side Increase Semglee  to 24 units in a.m. Continue Semglee   14 units in  p.m. Increase Premeal NovoLog  to 6 units  Follow/optimize-reassess on 5/3   Recent Labs    12/28/23 2107 12/29/23 0733 12/29/23 1107  GLUCAP 289* 171* 172*    Failure to thrive syndrome Poor oral intake Continue PT/OT/nutrition eval Appears somewhat depressed-but per patient this is primarily due to his frustration of remaining in the hospital.  He does not want to talk to a psychiatrist. Continue Remeron  that was started during this hospitalization.  Debility/deconditioning Due to acute illness PT/OT eval-SNF recommended.Social worker following-private pay being explored by family  Code status:   Code Status: Limited: Do not attempt resuscitation (DNR) -DNR-LIMITED -Do Not Intubate/DNI    DVT Prophylaxis: Place TED hose Start: 12/14/23 1312 Place and maintain sequential compression device Start: 12/09/23 0555 SCDs Start: 12/08/23 1707 apixaban  (ELIQUIS ) tablet 5 mg    Family Communication: Ex Wife [name]- [phone]-updated on 5/2  Disposition Plan: Status is: Inpatient Remains inpatient appropriate because: Severity of illness   Planned Discharge Destination:Skilled nursing facility   Diet: Diet Order             Diet regular Room service appropriate? Yes; Fluid consistency: Thin  Diet effective now           Diet - low sodium heart healthy           Diet Carb Modified                     Antimicrobial agents: Anti-infectives (From admission, onward)    Start     Dose/Rate Route Frequency Ordered Stop   12/21/23 0000  rifaximin  (XIFAXAN ) 550 MG TABS tablet        550 mg Oral 2 times daily 12/21/23 0826     12/10/23 2200  rifaximin  (XIFAXAN ) tablet 550 mg        550 mg Oral 2 times daily 12/10/23 1815     12/09/23 1000  rifaximin  (XIFAXAN ) tablet 550 mg  Status:  Discontinued        550 mg Per Tube 2 times daily 12/09/23 0827 12/10/23 1815        MEDICATIONS: Scheduled Meds:  amiodarone   100 mg Oral Daily   apixaban   5 mg Oral BID    atorvastatin   40 mg Oral Daily   feeding supplement (GLUCERNA SHAKE)  237 mL Oral TID BM   Gerhardt's butt cream   Topical TID   insulin  aspart  0-5 Units Subcutaneous QHS   insulin  aspart  0-9 Units Subcutaneous TID WC   insulin  aspart  6 Units Subcutaneous TID WC   insulin  glargine-yfgn  14 Units Subcutaneous QHS   insulin  glargine-yfgn  24 Units Subcutaneous q morning   lactulose   30 g Oral TID   midodrine   10 mg Oral TID WC   mirtazapine   15 mg Oral QHS   pantoprazole   40 mg Oral BID AC   rifaximin   550 mg Oral BID   sodium chloride  flush  3 mL Intravenous Q12H   Continuous Infusions:    PRN Meds:.acetaminophen  **OR** acetaminophen , diltiazem , melatonin   I have personally reviewed following labs and imaging studies  LABORATORY DATA: CBC: Recent Labs  Lab 12/26/23 0339  WBC 6.4  HGB 11.5*  HCT 33.8*  MCV 102.1*  PLT 184  Basic Metabolic Panel: Recent Labs  Lab 12/26/23 0339 12/27/23 0336 12/28/23 0446 12/29/23 0443  NA 137 139 140 139  K 3.2* 3.9 3.3* 3.6  CL 102 105 106 107  CO2 25 24 22 24   GLUCOSE 313* 157* 262* 199*  BUN 7* 7* 7* 7*  CREATININE 0.80 0.78 0.74 0.77  CALCIUM  10.8* 10.7* 10.7* 10.6*  MG  --  1.9  --   --      GFR: Estimated Creatinine Clearance: 78.4 mL/min (by C-G formula based on SCr of 0.77 mg/dL).  Liver Function Tests: Recent Labs  Lab 12/27/23 0336  AST 55*  ALT 39  ALKPHOS 133*  BILITOT 1.5*  PROT 6.6  ALBUMIN  3.8    No results for input(s): "LIPASE", "AMYLASE" in the last 168 hours. Recent Labs  Lab 12/26/23 0339  AMMONIA 61*     Coagulation Profile: No results for input(s): "INR", "PROTIME" in the last 168 hours.  Cardiac Enzymes: No results for input(s): "CKTOTAL", "CKMB", "CKMBINDEX", "TROPONINI" in the last 168 hours.  BNP (last 3 results) No results for input(s): "PROBNP" in the last 8760 hours.  Lipid Profile: No results for input(s): "CHOL", "HDL", "LDLCALC", "TRIG", "CHOLHDL", "LDLDIRECT"  in the last 72 hours.  Thyroid  Function Tests: No results for input(s): "TSH", "T4TOTAL", "FREET4", "T3FREE", "THYROIDAB" in the last 72 hours.  Anemia Panel: No results for input(s): "VITAMINB12", "FOLATE", "FERRITIN", "TIBC", "IRON ", "RETICCTPCT" in the last 72 hours.  Urine analysis:    Component Value Date/Time   COLORURINE YELLOW 12/08/2023 1733   APPEARANCEUR HAZY (A) 12/08/2023 1733   APPEARANCEUR Clear 07/05/2021 1129   LABSPEC 1.019 12/08/2023 1733   PHURINE 7.0 12/08/2023 1733   GLUCOSEU NEGATIVE 12/08/2023 1733   GLUCOSEU NEGATIVE 04/22/2021 1048   HGBUR NEGATIVE 12/08/2023 1733   BILIRUBINUR NEGATIVE 12/08/2023 1733   BILIRUBINUR Negative 07/05/2021 1129   KETONESUR NEGATIVE 12/08/2023 1733   PROTEINUR NEGATIVE 12/08/2023 1733   UROBILINOGEN 0.2 04/22/2021 1048   NITRITE NEGATIVE 12/08/2023 1733   LEUKOCYTESUR NEGATIVE 12/08/2023 1733    Sepsis Labs: Lactic Acid, Venous    Component Value Date/Time   LATICACIDVEN 1.6 12/08/2023 1406    MICROBIOLOGY: No results found for this or any previous visit (from the past 240 hours).  RADIOLOGY STUDIES/RESULTS: No results found.   LOS: 21 days   Kimberly Penna, MD  [facility]    To contact the attending provider between 7A-7P or the covering provider during after hours 7P-7A, please log into the web site [email or web address] and access using universal [place] password for that web site. If you do not have the password, please call the hospital operator.  12/29/2023, 11:26 AM

## 2023-12-29 NOTE — Progress Notes (Signed)
 Mobility Specialist: Progress Note   12/29/23 1453  Mobility  Activity Transferred from chair to bed  Level of Assistance +2 (takes two people)  Press photographer wheel walker  Activity Response Tolerated fair  Mobility Referral Yes  Mobility visit 1 Mobility  Mobility Specialist Start Time (ACUTE ONLY) 1420  Mobility Specialist Stop Time (ACUTE ONLY) 1450  Mobility Specialist Time Calculation (min) (ACUTE ONLY) 30 min    Pt ready to get back to bed - received in chair. Heavy modA+2 for initial stand. Incontinent of bowels while in the chair and upon standing. Performed 2 more STS for pericare with +3 RN assist. Became maxA+2 by last STS, very fatigued and needed to transfer to bed. MaxA+2 for stand pivot to bed. ModA to assist with rolling while RN completed pericare and dressing change. Left in supine with all needs met, call bell in reach.   Deloria Fetch Mobility Specialist Please contact via SecureChat or Rehab office at [phone]

## 2023-12-29 NOTE — Progress Notes (Signed)
 Physical Therapy Treatment Patient Details Name: Tony Cantu MRN: [id number] DOB: 1951/06/04 Today's Date: 12/29/2023   History of Present Illness Patient is 73 yo male admitted on 12/08/23 for AMS, nausea, vomiting, and dark stool. PMHx significant for type 1 diabetes, A-fib on Eliquis , seizure disorder, aortic stenosis, HTN, peptic ulcer disease, OA with bilateral knee replacement, polyclonal gammopathy, OSA on CPAP, GERD, COPD/asthma, chronic diastolic CHF, cirrhosis, portal hypertension, and BPH.    PT Comments  Gradually making progress towards functional goals. BP still drops upon sitting but rebounds after transfer and is stable in recliner. Reviewed bed mobility with improved independence today, noting min assist for Rt and Lt turns. Stool incontinence, assisted with peri care. Mod-Max assist for sit to stand - fatigues rapidly and requires the increased assist after the first round. Max assist to step pivot, difficulty sequencing, pushing posteriorly despite multimodal cues and attempts to sit prematurely. Patient will continue to benefit from skilled physical therapy services to further improve independence with functional mobility.     If plan is discharge home, recommend the following: Two people to help with walking and/or transfers;A lot of help with bathing/dressing/bathroom;Assistance with cooking/housework;Direct supervision/assist for medications management;Direct supervision/assist for financial management;Assist for transportation;Supervision due to cognitive status   Can travel by private vehicle     No  Equipment Recommendations  None recommended by PT (TBD)    Recommendations for Other Services       Precautions / Restrictions Precautions Precautions: Fall Recall of Precautions/Restrictions: Impaired Precaution/Restrictions Comments: watch BP; Frequent loose stools Restrictions Weight Bearing Restrictions Per Provider Order: No     Mobility  Bed  Mobility Overal bed mobility: Needs Assistance Bed Mobility: Rolling, Sit to Supine Rolling: Min assist, Used rails Sidelying to sit: Mod assist, Used rails       General bed mobility comments: Min assist to roll towards Rt and Lt side for peri-care due to stool incontinence. Mod assist for trunk support to rise from Lt side. Able to pull through RUE holding therapist's hand. Stabilized with BIL UE support, leaning posteriorly.    Transfers Overall transfer level: Needs assistance Equipment used: Rolling walker (2 wheels) Transfers: Sit to/from Stand Sit to Stand: Max assist   Step pivot transfers: Max assist       General transfer comment: Initial stand mod assist from bed, but practiced a couple of times forom recliner and required max assist, difficulty fully extending knees Rt worse than Lt. Multimodal cues and Max assist for upright stance and step pivot transfer. Pt attempting to sit prematurely despite max cues, pushing posteriorly.    Ambulation/Gait                   Stairs             Wheelchair Mobility     Tilt Bed    Modified Rankin (Stroke Patients Only)       Balance Overall balance assessment: Needs assistance Sitting-balance support: Feet supported, Bilateral upper extremity supported Sitting balance-Leahy Scale: Poor Sitting balance - Comments: CGA edge of chair. max cues to pull self forward while sitting in recliner. Postural control: Posterior lean Standing balance support: Bilateral upper extremity supported, Reliant on assistive device for balance Standing balance-Leahy Scale: Poor Standing balance comment: Posterior lean. Initial stand mod assist for balance, later required max assist.                            Communication  Communication Communication: No apparent difficulties  Cognition Arousal: Alert Behavior During Therapy: WFL for tasks assessed/performed   PT - Cognitive impairments: No family/caregiver  present to determine baseline, Attention, Sequencing, Problem solving, Safety/Judgement, Awareness, Orientation, Memory                         Following commands: Impaired Following commands impaired: Only follows one step commands consistently    Cueing Cueing Techniques: Verbal cues, Tactile cues, Gestural cues  Exercises General Exercises - Lower Extremity Ankle Circles/Pumps: AROM, Both, 10 reps, Supine Quad Sets: Strengthening, Both, 10 reps, Supine Gluteal Sets: Strengthening, Both, 10 reps, Supine Heel Slides: Strengthening, Both, 5 reps, Supine Hip ABduction/ADduction: Strengthening, Both, 10 reps, Supine    General Comments General comments (skin integrity, edema, etc.): Supine BP 111/67 HR 92. Seated BP 80/66 (MAP 73) HR 94 (+dizziness). Seated BP after 5 minutes and transfer to chair with LEs elevated 127/68 (MAP 86) HR 96; dizziness improving. SpO2 96% on RA throughout session.      Pertinent Vitals/Pain Pain Assessment Pain Assessment: No/denies pain    Home Living                          Prior Function            PT Goals (current goals can now be found in the care plan section) Acute Rehab PT Goals Patient Stated Goal: Go to rehab in [place] PT Goal Formulation: Patient unable to participate in goal setting Time For Goal Achievement: 01/05/24 Potential to Achieve Goals: Fair Progress towards PT goals: Progressing toward goals    Frequency    Min 1X/week      PT Plan      Co-evaluation              AM-PAC PT "6 Clicks" Mobility   Outcome Measure  Help needed turning from your back to your side while in a flat bed without using bedrails?: A Little Help needed moving from lying on your back to sitting on the side of a flat bed without using bedrails?: A Little Help needed moving to and from a bed to a chair (including a wheelchair)?: Total Help needed standing up from a chair using your arms (e.g., wheelchair or bedside  chair)?: Total Help needed to walk in hospital room?: Total Help needed climbing 3-5 steps with a railing? : Total 6 Click Score: 10    End of Session Equipment Utilized During Treatment: Gait belt Activity Tolerance: Patient tolerated treatment well Patient left: with call bell/phone within reach;in chair;with chair alarm set Nurse Communication: Mobility status;Need for lift equipment PT Visit Diagnosis: Muscle weakness (generalized) (M62.81);Difficulty in walking, not elsewhere classified (R26.2);Other symptoms and signs involving the nervous system (R29.898);Dizziness and giddiness (R42)     Time: 1206-1224 PT Time Calculation (min) (ACUTE ONLY): 18 min  Charges:    $Therapeutic Activity: 8-22 mins PT General Charges $$ ACUTE PT VISIT: 1 Visit                     Jory Ng, PT, DPT [facility]  Rehabilitation Services Physical Therapist Office: [phone] Website: [email or web address]    Alinda Irani 12/29/2023, 1:35 PM

## 2023-12-29 NOTE — Plan of Care (Signed)
  Problem: Clinical Measurements: Goal: Ability to maintain clinical measurements within normal limits will improve Outcome: Progressing Goal: Will remain free from infection Outcome: Progressing   Problem: Activity: Goal: Risk for activity intolerance will decrease Outcome: Progressing   Problem: Safety: Goal: Ability to remain free from injury will improve Outcome: Progressing   

## 2023-12-30 DIAGNOSIS — K7682 Hepatic encephalopathy: Secondary | ICD-10-CM | POA: Diagnosis not present

## 2023-12-30 DIAGNOSIS — I480 Paroxysmal atrial fibrillation: Secondary | ICD-10-CM | POA: Diagnosis not present

## 2023-12-30 DIAGNOSIS — K922 Gastrointestinal hemorrhage, unspecified: Secondary | ICD-10-CM | POA: Diagnosis not present

## 2023-12-30 DIAGNOSIS — J449 Chronic obstructive pulmonary disease, unspecified: Secondary | ICD-10-CM | POA: Diagnosis not present

## 2023-12-30 LAB — GLUCOSE, CAPILLARY
Glucose-Capillary: 75 mg/dL (ref 70–99)
Glucose-Capillary: 215 mg/dL — ABNORMAL HIGH (ref 70–99)
Glucose-Capillary: 342 mg/dL — ABNORMAL HIGH (ref 70–99)
Glucose-Capillary: 161 mg/dL — ABNORMAL HIGH (ref 70–99)

## 2023-12-30 LAB — BASIC METABOLIC PANEL WITH GFR
Anion gap: 9 (ref 5–15)
BUN: 6 mg/dL — ABNORMAL LOW (ref 8–23)
CO2: 25 mmol/L (ref 22–32)
Calcium: 10.6 mg/dL — ABNORMAL HIGH (ref 8.9–10.3)
Chloride: 105 mmol/L (ref 98–111)
Creatinine, Ser: 0.67 mg/dL (ref 0.61–1.24)
GFR, Estimated: >60 mL/min (ref >60)
Glucose, Bld: 102 mg/dL — ABNORMAL HIGH (ref 70–99)
Potassium: 3.3 mmol/L — ABNORMAL LOW (ref 3.5–5.1)
Sodium: 139 mmol/L (ref 135–145)

## 2023-12-30 MED ORDER — INSULIN GLARGINE-YFGN 100 UNIT/ML ~~LOC~~ SOLN
10.0000 [IU] | Freq: Every day | SUBCUTANEOUS | Status: DC
  Administered 2023-12-30 – 2024-01-01 (×3): 10 [IU] via SUBCUTANEOUS
  Filled 2023-12-30 (×4): qty 0.1

## 2023-12-30 MED ORDER — POTASSIUM CHLORIDE CRYS ER 20 MEQ PO TBCR
40.0000 meq | EXTENDED_RELEASE_TABLET | Freq: Once | ORAL | Status: AC
  Administered 2023-12-30: 40 meq via ORAL
  Filled 2023-12-30: qty 2

## 2023-12-30 NOTE — Progress Notes (Signed)
 PROGRESS NOTE        PATIENT DETAILS Name: Tony Cantu Age: 73 y.o. Sex: male Date of Birth: 1951/02/12 Admit Date: 12/08/2023 Admitting Physician Johnetta Nab, MD [id number], Gwendalyn Lemma, DO  Brief Summary: Patient is a 73 y.o.  male with history of [name] cirrhosis, PAF on anticoagulation who presented with somnolence/fatigue-was found to have hepatic encephalopathy along with upper GI bleeding.  Hospital course complicated by orthostatic hypotension and mild hypercalcemia.  Significant events: 4/11>> admit to [facility]  Significant studies: 4/11>> CT abdomen/pelvis: Mild circumferential thickening of the rectum-favoring proctitis. 4/11>> CT head: No acute intracranial abnormality. 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  Consults: GI  Subjective: No major issues overnight-lying comfortably in bed.  Claims he is eating "some".  He still appears frustrated and somewhat depressed because he is still in the hospital.  Objective: Vitals: Blood pressure 110/64, pulse 66, temperature 97.6 F (36.4 C), temperature source Oral, resp. rate 19, height 5' 7.99" (1.727 m), weight 74.8 kg, SpO2 97%.   Exam: Frail but awake alert Abdomen: Soft nontender nondistended Extremities: No edema Neurology: Nonfocal.  Pertinent Labs/Radiology:    Latest Ref Rng & Units 12/26/2023    3:39 AM 12/20/2023    3:44 AM 12/18/2023    1:31 AM  CBC  WBC 4.0 - 10.5 K/uL 6.4  5.6  4.5   Hemoglobin 13.0 - 17.0 g/dL 84.6  96.2  95.2   Hematocrit 39.0 - 52.0 % 33.8  34.3  31.5   Platelets 150 - 400 K/uL 184  116  80     Lab Results  Component Value Date   NA 139 12/30/2023    K 3.3 (L) 12/30/2023   CL 105 12/30/2023   CO2 25 12/30/2023     Assessment/Plan: Hepatic encephalopathy Likely triggered by upper GI bleeding Resolved with lactulose /Xifaxan  Completely awake/alert this morning  Upper GI bleeding Secondary to AVMs S/p EGD with APC 4/16 No further bleeding-Hb stable Follow CBC Continue PPI twice daily  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  Chronic HFpEF Euvolemic on exam Diuretics as needed  Hypokalemia Replete/recheck  Mild hypercalcemia Unclear etiology but suspect this is from immobilization-as he has been very reluctant to move for for the past a week or so.  Do not see any culprit medications PTH levels appropriately suppressed Thankfully-this is very mild hypercalcemia-he is asymptomatic-we will watch him closely-SNF bed is pending and will take several days.  Have discussed with PT/OT and with the mobility tech-they will try and work with nursing staff and see him more frequently. Patient was counseled extensively regarding the importance of mobilization/moving around. Will check calcium  levels every 48 hours while he is hospitalized and if worsens-we can consider 1 dose of bisphosphonates if it gets worse.  Will need outpatient endocrine referral if this does not resolve with mobilization.  PAF Telemetry monitoring Amiodarone  Eliquis .  HLD Continue to hold statins-trend LFTs.  Transaminitis Unclear etiology Downtrending Follow-remains on amiodarone -hence watch closely.  COPD/asthma Stable  Bronchodilators  [facility] Has been refusing CPAP here-but apparently uses at home  DM-2 (A1c 4.4 on 1/21) CBGs on the higher side Had hypoglycemia earlier-plan is to allow some amount of permissive hyperglycemia given for  erratic oral intake CBGs stabilizing-mildly/borderline hypoglycemic this morning Decrease Semglee  to 10 units in p.m. Continue Semglee  24 units in a.m. Continue Premeal NovoLog  6 units Continue SSI Follow/optimize.    Recent Labs    12/29/23 2102 12/30/23 0812 12/30/23 1226  GLUCAP 185* 75 215*    Failure to thrive syndrome Poor oral intake Continue PT/OT/nutrition eval Appears somewhat depressed-but per patient this is primarily due to his frustration of remaining in the hospital.  He does not want to talk to a psychiatrist. Continue Remeron  that was started during this hospitalization.  Debility/deconditioning Due to acute illness PT/OT eval-SNF recommended.Social worker following-private pay being explored by family  Code status:   Code Status: Limited: Do not attempt resuscitation (DNR) -DNR-LIMITED -Do Not Intubate/DNI    DVT Prophylaxis: Place TED hose Start: 12/14/23 1312 Place and maintain sequential compression device Start: 12/09/23 0555 SCDs Start: 12/08/23 1707 apixaban  (ELIQUIS ) tablet 5 mg    Family Communication: Ex Wife [name]- [phone]-updated on 5/2  Disposition Plan: Status is: Inpatient Remains inpatient appropriate because: Severity of illness   Planned Discharge Destination:Skilled nursing facility   Diet: Diet Order             Diet regular Room service appropriate? Yes; Fluid consistency: Thin  Diet effective now           Diet - low sodium heart healthy           Diet Carb Modified                     Antimicrobial agents: Anti-infectives (From admission, onward)    Start     Dose/Rate Route Frequency Ordered Stop   12/21/23 0000  rifaximin  (XIFAXAN ) 550 MG TABS tablet        550 mg Oral 2 times daily 12/21/23 0826     12/10/23 2200  rifaximin  (XIFAXAN ) tablet 550 mg        550 mg Oral 2 times daily 12/10/23 1815     12/09/23 1000  rifaximin  (XIFAXAN ) tablet 550 mg  Status:  Discontinued        550 mg Per Tube 2 times  daily 12/09/23 0827 12/10/23 1815        MEDICATIONS: Scheduled Meds:  amiodarone   100 mg Oral Daily   apixaban   5 mg Oral BID   atorvastatin   40 mg Oral Daily   feeding supplement (GLUCERNA SHAKE)  237 mL Oral TID BM   insulin  aspart  0-5 Units Subcutaneous QHS   insulin  aspart  0-9 Units Subcutaneous TID WC   insulin  aspart  6 Units Subcutaneous TID WC   insulin  glargine-yfgn  14 Units Subcutaneous QHS   insulin  glargine-yfgn  24 Units Subcutaneous q morning   lactulose   30 g Oral TID   leptospermum manuka honey  1 Application Topical Daily   midodrine   10 mg Oral TID WC   mirtazapine   15 mg Oral QHS   pantoprazole   40 mg Oral BID AC   rifaximin   550 mg Oral BID   sodium chloride  flush  3 mL Intravenous Q12H   Continuous Infusions:    PRN Meds:.acetaminophen  **OR** acetaminophen , diltiazem , melatonin   I have personally reviewed following labs and imaging studies  LABORATORY DATA: CBC: Recent Labs  Lab 12/26/23  0339  WBC 6.4  HGB 11.5*  HCT 33.8*  MCV 102.1*  PLT 184    Basic Metabolic Panel: Recent Labs  Lab 12/26/23 0339 12/27/23 0336 12/28/23 0446 12/29/23 0443 12/30/23 0403  NA 137 139 140 139 139  K 3.2* 3.9 3.3* 3.6 3.3*  CL 102 105 106 107 105  CO2 25 24 22 24 25   GLUCOSE 313* 157* 262* 199* 102*  BUN 7* 7* 7* 7* 6*  CREATININE 0.80 0.78 0.74 0.77 0.67  CALCIUM  10.8* 10.7* 10.7* 10.6* 10.6*  MG  --  1.9  --   --   --      GFR: Estimated Creatinine Clearance: 78.4 mL/min (by C-G formula based on SCr of 0.67 mg/dL).  Liver Function Tests: Recent Labs  Lab 12/27/23 0336  AST 55*  ALT 39  ALKPHOS 133*  BILITOT 1.5*  PROT 6.6  ALBUMIN  3.8    No results for input(s): "LIPASE", "AMYLASE" in the last 168 hours. Recent Labs  Lab 12/26/23 0339  AMMONIA 61*     Coagulation Profile: No results for input(s): "INR", "PROTIME" in the last 168 hours.  Cardiac Enzymes: No results for input(s): "CKTOTAL", "CKMB", "CKMBINDEX",  "TROPONINI" in the last 168 hours.  BNP (last 3 results) No results for input(s): "PROBNP" in the last 8760 hours.  Lipid Profile: No results for input(s): "CHOL", "HDL", "LDLCALC", "TRIG", "CHOLHDL", "LDLDIRECT" in the last 72 hours.  Thyroid  Function Tests: No results for input(s): "TSH", "T4TOTAL", "FREET4", "T3FREE", "THYROIDAB" in the last 72 hours.  Anemia Panel: No results for input(s): "VITAMINB12", "FOLATE", "FERRITIN", "TIBC", "IRON ", "RETICCTPCT" in the last 72 hours.  Urine analysis:    Component Value Date/Time   COLORURINE YELLOW 12/08/2023 1733   APPEARANCEUR HAZY (A) 12/08/2023 1733   APPEARANCEUR Clear 07/05/2021 1129   LABSPEC 1.019 12/08/2023 1733   PHURINE 7.0 12/08/2023 1733   GLUCOSEU NEGATIVE 12/08/2023 1733   GLUCOSEU NEGATIVE 04/22/2021 1048   HGBUR NEGATIVE 12/08/2023 1733   BILIRUBINUR NEGATIVE 12/08/2023 1733   BILIRUBINUR Negative 07/05/2021 1129   KETONESUR NEGATIVE 12/08/2023 1733   PROTEINUR NEGATIVE 12/08/2023 1733   UROBILINOGEN 0.2 04/22/2021 1048   NITRITE NEGATIVE 12/08/2023 1733   LEUKOCYTESUR NEGATIVE 12/08/2023 1733    Sepsis Labs: Lactic Acid, Venous    Component Value Date/Time   LATICACIDVEN 1.6 12/08/2023 1406    MICROBIOLOGY: No results found for this or any previous visit (from the past 240 hours).  RADIOLOGY STUDIES/RESULTS: No results found.   LOS: 22 days   Kimberly Penna, MD  [facility]    To contact the attending provider between 7A-7P or the covering provider during after hours 7P-7A, please log into the web site [email or web address] and access using universal [place] password for that web site. If you do not have the password, please call the hospital operator.  12/30/2023, 12:45 PM

## 2023-12-30 NOTE — Plan of Care (Signed)
  Problem: Clinical Measurements: Goal: Diagnostic test results will improve Outcome: Progressing Goal: Respiratory complications will improve Outcome: Progressing   Problem: Activity: Goal: Risk for activity intolerance will decrease Outcome: Progressing   Problem: Nutrition: Goal: Adequate nutrition will be maintained Outcome: Progressing   Problem: Coping: Goal: Level of anxiety will decrease Outcome: Progressing

## 2023-12-31 DIAGNOSIS — K922 Gastrointestinal hemorrhage, unspecified: Secondary | ICD-10-CM | POA: Diagnosis not present

## 2023-12-31 LAB — GLUCOSE, CAPILLARY
Glucose-Capillary: 90 mg/dL (ref 70–99)
Glucose-Capillary: 139 mg/dL — ABNORMAL HIGH (ref 70–99)
Glucose-Capillary: 277 mg/dL — ABNORMAL HIGH (ref 70–99)
Glucose-Capillary: 185 mg/dL — ABNORMAL HIGH (ref 70–99)

## 2023-12-31 LAB — COMPREHENSIVE METABOLIC PANEL WITH GFR
ALT: 38 U/L (ref 0–44)
AST: 52 U/L — ABNORMAL HIGH (ref 15–41)
Albumin: 3.2 g/dL — ABNORMAL LOW (ref 3.5–5.0)
Alkaline Phosphatase: 122 U/L (ref 38–126)
Anion gap: 15 (ref 5–15)
BUN: 8 mg/dL (ref 8–23)
CO2: 24 mmol/L (ref 22–32)
Calcium: 11.1 mg/dL — ABNORMAL HIGH (ref 8.9–10.3)
Chloride: 105 mmol/L (ref 98–111)
Creatinine, Ser: 0.68 mg/dL (ref 0.61–1.24)
GFR, Estimated: >60 mL/min (ref >60)
Glucose, Bld: 88 mg/dL (ref 70–99)
Potassium: 3.7 mmol/L (ref 3.5–5.1)
Sodium: 144 mmol/L (ref 135–145)
Total Bilirubin: 1.2 mg/dL (ref 0.0–1.2)
Total Protein: 6.1 g/dL — ABNORMAL LOW (ref 6.5–8.1)

## 2023-12-31 LAB — CBC
HCT: 32.3 % — ABNORMAL LOW (ref 39.0–52.0)
Hemoglobin: 10.5 g/dL — ABNORMAL LOW (ref 13.0–17.0)
MCH: 34.2 pg — ABNORMAL HIGH (ref 26.0–34.0)
MCHC: 32.5 g/dL (ref 30.0–36.0)
MCV: 105.2 fL — ABNORMAL HIGH (ref 80.0–100.0)
Platelets: 158 10*3/uL (ref 150–400)
RBC: 3.07 MIL/uL — ABNORMAL LOW (ref 4.22–5.81)
RDW: 16.2 % — ABNORMAL HIGH (ref 11.5–15.5)
WBC: 6.1 10*3/uL (ref 4.0–10.5)
nRBC: 0 % (ref 0.0–0.2)

## 2023-12-31 LAB — MAGNESIUM: Magnesium: 1.7 mg/dL (ref 1.7–2.4)

## 2023-12-31 LAB — TSH: TSH: 2.066 u[IU]/mL (ref 0.350–4.500)

## 2023-12-31 MED ORDER — MAGNESIUM SULFATE 2 GM/50ML IV SOLN
2.0000 g | Freq: Once | INTRAVENOUS | Status: AC
  Administered 2023-12-31: 2 g via INTRAVENOUS
  Filled 2023-12-31: qty 50

## 2023-12-31 NOTE — Plan of Care (Signed)
  Problem: Clinical Measurements: Goal: Diagnostic test results will improve Outcome: Progressing Goal: Respiratory complications will improve Outcome: Progressing   Problem: Nutrition: Goal: Adequate nutrition will be maintained Outcome: Progressing   Problem: Coping: Goal: Level of anxiety will decrease Outcome: Progressing   Problem: Elimination: Goal: Will not experience complications related to bowel motility Outcome: Progressing

## 2023-12-31 NOTE — Progress Notes (Signed)
 PROGRESS NOTE        PATIENT DETAILS Name: Tony Cantu Age: 73 y.o. Sex: male Date of Birth: 01-Mar-1951 Admit Date: 12/08/2023 Admitting Physician Johnetta Nab, MD WUJ:[id number], Gwendalyn Lemma, DO  Brief Summary: Patient is a 73 y.o.  male with history of [name] cirrhosis, PAF on anticoagulation who presented with somnolence/fatigue-was found to have hepatic encephalopathy along with upper GI bleeding.  Hospital course complicated by orthostatic hypotension and mild hypercalcemia.  Significant events: 4/11>> admit to [facility]  Significant studies: 4/11>> CT abdomen/pelvis: Mild circumferential thickening of the rectum-favoring proctitis. 4/11>> CT head: No acute intracranial abnormality. 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  Consults: GI  Subjective:  Patient in bed, appears comfortable, denies any headache, no fever, no chest pain or pressure, no shortness of breath , no abdominal pain. No new focal weakness.  Objective: Vitals: Blood pressure 102/72, pulse 69, temperature 98 F (36.7 C), temperature source Oral, resp. rate 17, height 5' 7.99" (1.727 m), weight 74.8 kg, SpO2 97%.   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  Chronic HFpEF Euvolemic on exam Diuretics as needed  Hypokalemia Replete/recheck  Mild hypercalcemia Unclear etiology but suspect this is from immobilization-as he has been very reluctant to move for for the past a week or so.  Do not see any culprit medications PTH levels appropriately suppressed Thankfully-this is very mild hypercalcemia-he is asymptomatic-we will watch him closely-SNF bed is pending and will take several days.  Have discussed with PT/OT and with the mobility tech-they will try and work with nursing staff and see him more frequently. Patient was counseled extensively regarding the importance of mobilization/moving around. Will check calcium  levels every 48 hours while he is hospitalized and if worsens-we can consider 1 dose of bisphosphonates if it gets worse.  Will need outpatient endocrine referral if this does not resolve with mobilization.  PAF Telemetry monitoring Amiodarone  Eliquis .  HLD Continue to hold statins-trend LFTs.  Transaminitis Unclear etiology Downtrending Follow-remains on amiodarone -hence watch closely.  COPD/asthma Stable Bronchodilators  [facility] Has been refusing CPAP here-but apparently uses at home  DM-2 (A1c 4.4 on 1/21) CBGs on the higher side Had hypoglycemia earlier-plan is to allow some amount of permissive hyperglycemia given for erratic oral intake CBGs stabilizing-mildly/borderline hypoglycemic this morning Decrease Semglee  to 10 units in p.m. Continue Semglee  24 units in a.m. Continue Premeal NovoLog  6 units Continue SSI Follow/optimize.    Recent Labs    12/30/23 1646  12/30/23 2117 12/31/23 0739   GLUCAP 342* 161* 90    Failure to thrive syndrome Poor oral intake Continue PT/OT/nutrition eval Appears somewhat depressed-but per patient this is primarily due to his frustration of remaining in the hospital.  He does not want to talk to a psychiatrist. Continue Remeron  that was started during this hospitalization.  Debility/deconditioning Due to acute illness PT/OT eval-SNF recommended.Social worker following-private pay being explored by family  Code status:   Code Status: Limited: Do not attempt resuscitation (DNR) -DNR-LIMITED -Do Not Intubate/DNI    DVT Prophylaxis: Place TED hose Start: 12/14/23 1312 Place and maintain sequential compression device Start: 12/09/23 0555 SCDs Start: 12/08/23 1707 apixaban  (ELIQUIS ) tablet 5 mg    Family Communication: Ex Wife [name]- [phone]-updated on 5/2  Disposition Plan: Status is: Inpatient Remains inpatient appropriate because: Severity of illness   Planned Discharge Destination:Skilled nursing facility   Diet: Diet Order             Diet regular Room service appropriate? Yes; Fluid consistency: Thin  Diet effective now           Diet - low sodium heart healthy           Diet Carb Modified                     Antimicrobial agents: Anti-infectives (From admission, onward)    Start     Dose/Rate Route Frequency Ordered Stop   12/21/23 0000  rifaximin  (XIFAXAN ) 550 MG TABS tablet        550 mg Oral 2 times daily 12/21/23 0826     12/10/23 2200  rifaximin  (XIFAXAN ) tablet 550 mg        550 mg Oral 2 times daily 12/10/23 1815     12/09/23 1000  rifaximin  (XIFAXAN ) tablet 550 mg  Status:  Discontinued        550 mg Per Tube 2 times daily 12/09/23 0827 12/10/23 1815        MEDICATIONS: Scheduled Meds:  amiodarone   100 mg Oral Daily   apixaban   5 mg Oral BID   atorvastatin   40 mg Oral Daily   feeding supplement (GLUCERNA SHAKE)  237 mL Oral TID BM   insulin  aspart  0-5 Units Subcutaneous QHS   insulin   aspart  0-9 Units Subcutaneous TID WC   insulin  aspart  6 Units Subcutaneous TID WC   insulin  glargine-yfgn  10 Units Subcutaneous QHS   insulin  glargine-yfgn  24 Units Subcutaneous q morning   lactulose   30 g Oral TID   leptospermum manuka honey  1 Application Topical Daily   midodrine   10 mg Oral TID WC   mirtazapine   15 mg Oral QHS   pantoprazole   40 mg Oral BID AC   rifaximin   550 mg Oral BID   sodium chloride  flush  3 mL Intravenous Q12H   Continuous Infusions:  magnesium  sulfate bolus IVPB       PRN Meds:.acetaminophen  **OR** acetaminophen , diltiazem , melatonin   I have personally reviewed following labs and imaging studies  LABORATORY DATA: CBC: Recent Labs  Lab 12/26/23 0339 12/31/23 0552  WBC 6.4 6.1  HGB 11.5* 10.5*  HCT 33.8* 32.3*  MCV 102.1* 105.2*  PLT 184 158    Basic Metabolic Panel: Recent Labs  Lab 12/27/23 0336 12/28/23 0446 12/29/23 0443 12/30/23 0403 12/31/23 0627  NA 139 140 139 139 144  K 3.9 3.3* 3.6 3.3* 3.7  CL 105 106 107 105 105  CO2 24  22 24 25 24   GLUCOSE 157* 262* 199* 102* 88  BUN 7* 7* 7* 6* 8  CREATININE 0.78 0.74 0.77 0.67 0.68  CALCIUM  10.7* 10.7* 10.6* 10.6* 11.1*  MG 1.9  --   --   --  1.7     GFR: Estimated Creatinine Clearance: 78.4 mL/min (by C-G formula based on SCr of 0.68 mg/dL).  Liver Function Tests: Recent Labs  Lab 12/27/23 0336 12/31/23 0627  AST 55* 52*  ALT 39 38  ALKPHOS 133* 122  BILITOT 1.5* 1.2  PROT 6.6 6.1*  ALBUMIN  3.8 3.2*    No results for input(s): "LIPASE", "AMYLASE" in the last 168 hours. Recent Labs  Lab 12/26/23 0339  AMMONIA 61*     Coagulation Profile: No results for input(s): "INR", "PROTIME" in the last 168 hours.  Cardiac Enzymes: No results for input(s): "CKTOTAL", "CKMB", "CKMBINDEX", "TROPONINI" in the last 168 hours.  BNP (last 3 results) No results for input(s): "PROBNP" in the last 8760 hours.  Lipid Profile: No results for input(s): "CHOL", "HDL",  "LDLCALC", "TRIG", "CHOLHDL", "LDLDIRECT" in the last 72 hours.  Thyroid  Function Tests: No results for input(s): "TSH", "T4TOTAL", "FREET4", "T3FREE", "THYROIDAB" in the last 72 hours.  Anemia Panel: No results for input(s): "VITAMINB12", "FOLATE", "FERRITIN", "TIBC", "IRON ", "RETICCTPCT" in the last 72 hours.  Urine analysis:    Component Value Date/Time   COLORURINE YELLOW 12/08/2023 1733   APPEARANCEUR HAZY (A) 12/08/2023 1733   APPEARANCEUR Clear 07/05/2021 1129   LABSPEC 1.019 12/08/2023 1733   PHURINE 7.0 12/08/2023 1733   GLUCOSEU NEGATIVE 12/08/2023 1733   GLUCOSEU NEGATIVE 04/22/2021 1048   HGBUR NEGATIVE 12/08/2023 1733   BILIRUBINUR NEGATIVE 12/08/2023 1733   BILIRUBINUR Negative 07/05/2021 1129   KETONESUR NEGATIVE 12/08/2023 1733   PROTEINUR NEGATIVE 12/08/2023 1733   UROBILINOGEN 0.2 04/22/2021 1048   NITRITE NEGATIVE 12/08/2023 1733   LEUKOCYTESUR NEGATIVE 12/08/2023 1733    Sepsis Labs: Lactic Acid, Venous    Component Value Date/Time   LATICACIDVEN 1.6 12/08/2023 1406    MICROBIOLOGY: No results found for this or any previous visit (from the past 240 hours).  RADIOLOGY STUDIES/RESULTS: No results found.   LOS: 23 days   Signature  -    Lynnwood Sauer M.D on 12/31/2023 at 11:01 AM   -  To page go to [email or web address]

## 2023-12-31 NOTE — Progress Notes (Signed)
 RN and NT attempted to get patient up to the chair.But as soon as he dangled on the edge of the bed he laid back to bed and mentioned he is comfortable in the bed and doesn't want to be forced to seat on the chair.So the patient was returned back to bed.Education provided as needed.

## 2024-01-01 DIAGNOSIS — K922 Gastrointestinal hemorrhage, unspecified: Secondary | ICD-10-CM | POA: Diagnosis not present

## 2024-01-01 LAB — BASIC METABOLIC PANEL WITH GFR
Anion gap: 10 (ref 5–15)
BUN: 8 mg/dL (ref 8–23)
CO2: 24 mmol/L (ref 22–32)
Calcium: 10.4 mg/dL — ABNORMAL HIGH (ref 8.9–10.3)
Chloride: 106 mmol/L (ref 98–111)
Creatinine, Ser: 0.72 mg/dL (ref 0.61–1.24)
GFR, Estimated: >60 mL/min (ref >60)
Glucose, Bld: 159 mg/dL — ABNORMAL HIGH (ref 70–99)
Potassium: 4.1 mmol/L (ref 3.5–5.1)
Sodium: 140 mmol/L (ref 135–145)

## 2024-01-01 LAB — AMMONIA: Ammonia: 45 umol/L — ABNORMAL HIGH (ref 9–35)

## 2024-01-01 LAB — CBC WITH DIFFERENTIAL/PLATELET
Abs Immature Granulocytes: 0.03 10*3/uL (ref 0.00–0.07)
Basophils Absolute: 0.1 10*3/uL (ref 0.0–0.1)
Basophils Relative: 1 %
Eosinophils Absolute: 0.1 10*3/uL (ref 0.0–0.5)
Eosinophils Relative: 2 %
HCT: 34.4 % — ABNORMAL LOW (ref 39.0–52.0)
Hemoglobin: 11.3 g/dL — ABNORMAL LOW (ref 13.0–17.0)
Immature Granulocytes: 1 %
Lymphocytes Relative: 21 %
Lymphs Abs: 1.2 10*3/uL (ref 0.7–4.0)
MCH: 34.7 pg — ABNORMAL HIGH (ref 26.0–34.0)
MCHC: 32.8 g/dL (ref 30.0–36.0)
MCV: 105.5 fL — ABNORMAL HIGH (ref 80.0–100.0)
Monocytes Absolute: 1.1 10*3/uL — ABNORMAL HIGH (ref 0.1–1.0)
Monocytes Relative: 18 %
Neutro Abs: 3.5 10*3/uL (ref 1.7–7.7)
Neutrophils Relative %: 57 %
Platelets: 157 10*3/uL (ref 150–400)
RBC: 3.26 MIL/uL — ABNORMAL LOW (ref 4.22–5.81)
RDW: 16.2 % — ABNORMAL HIGH (ref 11.5–15.5)
WBC: 6 10*3/uL (ref 4.0–10.5)
nRBC: 0 % (ref 0.0–0.2)

## 2024-01-01 LAB — GLUCOSE, CAPILLARY
Glucose-Capillary: 143 mg/dL — ABNORMAL HIGH (ref 70–99)
Glucose-Capillary: 261 mg/dL — ABNORMAL HIGH (ref 70–99)
Glucose-Capillary: 286 mg/dL — ABNORMAL HIGH (ref 70–99)
Glucose-Capillary: 223 mg/dL — ABNORMAL HIGH (ref 70–99)

## 2024-01-01 LAB — MAGNESIUM: Magnesium: 2 mg/dL (ref 1.7–2.4)

## 2024-01-01 MED ORDER — LACTATED RINGERS IV SOLN: INTRAVENOUS | Status: AC

## 2024-01-01 NOTE — TOC Progression Note (Addendum)
 Transition of Care [facility]) - Progression Note    Patient Details  Name: Tony Cantu MRN: [id number] Date of Birth: [birth year]  Transition of Care [facility]) CM/SW Contact  Jannice Mends, LCSW Phone Number: 01/01/2024, 12:43 PM  Clinical Narrative:    12:43pm-CSW spoke with [name] who is heading to [facility] after lunch.   3:51 PM-CSW received call from [place] requesting CSW send referral to [facility]. CSW spoke with Stephenie Einstein at [facility] and provided referral.   Expected Discharge Plan: Skilled Nursing Facility Barriers to Discharge: Continued Medical Work up, English as a second language teacher  Expected Discharge Plan and Services In-house Referral: Clinical Social Work   Post Acute Care Choice: Skilled Nursing Facility Living arrangements for the past 2 months: Single Family Home, Skilled Nursing Facility Expected Discharge Date: 12/21/23                                     Social Determinants of Health (SDOH) Interventions SDOH Screenings   Food Insecurity: Patient Unable To Answer (12/09/2023)  Housing: Patient Unable To Answer (12/09/2023)  Recent Concern: Housing - High Risk (10/30/2023)  Transportation Needs: Patient Unable To Answer (12/09/2023)  Utilities: Patient Unable To Answer (12/09/2023)  Depression (PHQ2-9): Medium Risk (09/13/2023)  Financial Resource Strain: Low Risk  (06/09/2022)  Social Connections: Patient Unable To Answer (12/09/2023)  Recent Concern: Social Connections - Socially Isolated (10/30/2023)  Tobacco Use: Medium Risk (12/08/2023)    Readmission Risk Interventions     No data to display

## 2024-01-01 NOTE — Progress Notes (Signed)
 Chaplain responded to a request in the consult list, to provide information about the AD form. Pt stated his wife has full POA and that he does not need the HCPOA. Pt was grateful with Chaplain's visit.  Althea Jesus Chaplain Resident   01/01/24 1013  Spiritual Encounters  Type of Visit Initial  Care provided to: Patient  Referral source Clinical staff  Reason for visit Advance directives  OnCall Visit No

## 2024-01-01 NOTE — Progress Notes (Signed)
 PROGRESS NOTE        PATIENT DETAILS Name: Tony Cantu Age: 73 y.o. Sex: male Date of Birth: 1950/12/09 Admit Date: 12/08/2023 Admitting Physician Johnetta Nab, MD [id number], Gwendalyn Lemma, DO  Brief Summary: Patient is a 73 y.o.  male with history of [name] cirrhosis, PAF on anticoagulation who presented with somnolence/fatigue-was found to have hepatic encephalopathy along with upper GI bleeding.  Hospital course complicated by orthostatic hypotension and mild hypercalcemia.  Significant events: 4/11>> admit to [facility]  Significant studies: 4/11>> CT abdomen/pelvis: Mild circumferential thickening of the rectum-favoring proctitis. 4/11>> CT head: No acute intracranial abnormality. 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  Consults: GI, nephrology  Subjective: Patient in bed, appears comfortable, denies any headache, no fever, no chest pain or pressure, no shortness of breath , no abdominal pain. No focal weakness.  Objective: Vitals: Blood pressure 114/68, pulse 65, temperature 98.2 F (36.8 C), temperature source Oral, resp. rate 20, height 5' 7.99" (1.727 m), weight 74.8 kg, SpO2 96%.   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  Chronic HFpEF Euvolemic on exam Diuretics as needed  Hypokalemia Replete/recheck  Mild hypercalcemia Unclear etiology but suspect this is from immobilization-as he has been very reluctant to move for for the past a week or so.  Do not see any culprit medications PTH levels appropriately suppressed, stable vitamin D  levels Patient is very reluctant to move out of bed, extensively counseled patient and staff, mobility is highly essential. Gently hydrate with IV fluids, PTH RP levels pending, will request nephrology to evaluate as well, currently do not think he requires bisphosphonates.  PAF Telemetry monitoring Amiodarone  Eliquis .  HLD Continue to hold statins-trend LFTs.  Transaminitis Unclear etiology Downtrending Follow-remains on amiodarone -hence watch closely.  COPD/asthma Stable Bronchodilators  [facility]A Has been refusing CPAP here-but apparently uses at home  DM-2 (A1c 4.4 on 1/21) CBGs on the higher side Had hypoglycemia earlier-plan is to allow some amount of permissive hyperglycemia given for erratic oral intake CBGs stabilizing-mildly/borderline hypoglycemic this morning Decrease Semglee  to 10 units in p.m. Continue Semglee  24 units in a.m. Continue Premeal NovoLog  6 units Continue SSI Follow/optimize.    Recent Labs    12/31/23 1626 12/31/23 2118 01/01/24 0803  GLUCAP 277* 185* 143*    Failure to thrive syndrome Poor oral intake Continue PT/OT/nutrition eval Appears somewhat depressed-but per patient this is primarily due to his frustration of remaining in the hospital.  He does not want  to talk to a psychiatrist. Continue  Remeron  that was started during this hospitalization.  Debility/deconditioning Due to acute illness PT/OT eval-SNF recommended.Social worker following-private pay being explored by family  Code status:   Code Status: Limited: Do not attempt resuscitation (DNR) -DNR-LIMITED -Do Not Intubate/DNI    DVT Prophylaxis: Place TED hose Start: 12/14/23 1312 Place and maintain sequential compression device Start: 12/09/23 0555 SCDs Start: 12/08/23 1707 apixaban  (ELIQUIS ) tablet 5 mg    Family Communication: Ex Wife [name]- [phone]-updated on 5/2  Disposition Plan: Status is: Inpatient Remains inpatient appropriate because: Severity of illness   Planned Discharge Destination:Skilled nursing facility   Diet: Diet Order             Diet regular Room service appropriate? Yes; Fluid consistency: Thin  Diet effective now           Diet - low sodium heart healthy           Diet Carb Modified                     Antimicrobial agents: Anti-infectives (From admission, onward)    Start     Dose/Rate Route Frequency Ordered Stop   12/21/23 0000  rifaximin  (XIFAXAN ) 550 MG TABS tablet        550 mg Oral 2 times daily 12/21/23 0826     12/10/23 2200  rifaximin  (XIFAXAN ) tablet 550 mg        550 mg Oral 2 times daily 12/10/23 1815     12/09/23 1000  rifaximin  (XIFAXAN ) tablet 550 mg  Status:  Discontinued        550 mg Per Tube 2 times daily 12/09/23 0827 12/10/23 1815        MEDICATIONS: Scheduled Meds:  amiodarone   100 mg Oral Daily   apixaban   5 mg Oral BID   atorvastatin   40 mg Oral Daily   feeding supplement (GLUCERNA SHAKE)  237 mL Oral TID BM   insulin  aspart  0-5 Units Subcutaneous QHS   insulin  aspart  0-9 Units Subcutaneous TID WC   insulin  aspart  6 Units Subcutaneous TID WC   insulin  glargine-yfgn  10 Units Subcutaneous QHS   insulin  glargine-yfgn  24 Units Subcutaneous q morning   lactulose   30 g Oral TID   leptospermum manuka honey  1 Application Topical  Daily   midodrine   10 mg Oral TID WC   mirtazapine   15 mg Oral QHS   pantoprazole   40 mg Oral BID AC   rifaximin   550 mg Oral BID   sodium chloride  flush  3 mL Intravenous Q12H   Continuous Infusions:  lactated ringers  75 mL/hr at 01/01/24 0705     PRN Meds:.acetaminophen  **OR** acetaminophen , diltiazem , melatonin   I have personally reviewed following labs and imaging studies  LABORATORY DATA: CBC: Recent Labs  Lab 12/26/23 0339 12/31/23 0552 01/01/24 0400  WBC 6.4 6.1 6.0  NEUTROABS  --   --  3.5  HGB 11.5* 10.5* 11.3*  HCT 33.8* 32.3* 34.4*  MCV 102.1* 105.2* 105.5*  PLT 184 158 157    Basic Metabolic Panel: Recent Labs  Lab 12/27/23 0336 12/28/23 0446 12/29/23 0443 12/30/23 0403 12/31/23 0627 01/01/24 0330  NA 139 140 139 139 144 140  K 3.9 3.3* 3.6 3.3* 3.7 4.1  CL 105 106 107 105 105 106  CO2 24 22 24 25 24 24   GLUCOSE 157* 262* 199* 102* 88 159*  BUN 7* 7* 7* 6* 8 8  CREATININE 0.78 0.74 0.77 0.67 0.68 0.72  CALCIUM  10.7* 10.7* 10.6* 10.6* 11.1* 10.4*  MG 1.9  --   --   --  1.7 2.0     GFR: Estimated Creatinine Clearance: 78.4 mL/min (by C-G formula based on SCr of 0.72 mg/dL).  Liver Function Tests: Recent Labs  Lab 12/27/23 0336 12/31/23 0627  AST 55* 52*  ALT 39 38  ALKPHOS 133* 122  BILITOT 1.5* 1.2  PROT 6.6 6.1*  ALBUMIN  3.8 3.2*    No results for input(s): "LIPASE", "AMYLASE" in the last 168 hours. Recent Labs  Lab 12/26/23 0339 01/01/24 0330  AMMONIA 61* 45*     Coagulation Profile: No results for input(s): "INR", "PROTIME" in the last 168 hours.  Cardiac Enzymes: No results for input(s): "CKTOTAL", "CKMB", "CKMBINDEX", "TROPONINI" in the last 168 hours.  BNP (last 3 results) No results for input(s): "PROBNP" in the last 8760 hours.  Lipid Profile: No results for input(s): "CHOL", "HDL", "LDLCALC", "TRIG", "CHOLHDL", "LDLDIRECT" in the last 72 hours.  Thyroid  Function Tests: Recent Labs    12/31/23 0627  TSH  2.066    Anemia Panel: No results for input(s): "VITAMINB12", "FOLATE", "FERRITIN", "TIBC", "IRON ", "RETICCTPCT" in the last 72 hours.  Urine analysis:    Component Value Date/Time   COLORURINE YELLOW 12/08/2023 1733   APPEARANCEUR HAZY (A) 12/08/2023 1733   APPEARANCEUR Clear 07/05/2021 1129   LABSPEC 1.019 12/08/2023 1733   PHURINE 7.0 12/08/2023 1733   GLUCOSEU NEGATIVE 12/08/2023 1733   GLUCOSEU NEGATIVE 04/22/2021 1048   HGBUR NEGATIVE 12/08/2023 1733   BILIRUBINUR NEGATIVE 12/08/2023 1733   BILIRUBINUR Negative 07/05/2021 1129   KETONESUR NEGATIVE 12/08/2023 1733   PROTEINUR NEGATIVE 12/08/2023 1733   UROBILINOGEN 0.2 04/22/2021 1048   NITRITE NEGATIVE 12/08/2023 1733   LEUKOCYTESUR NEGATIVE 12/08/2023 1733    Sepsis Labs: Lactic Acid, Venous    Component Value Date/Time   LATICACIDVEN 1.6 12/08/2023 1406    MICROBIOLOGY: No results found for this or any previous visit (from the past 240 hours).  RADIOLOGY STUDIES/RESULTS: No results found.   LOS: 24 days   Signature  -    Lynnwood Sauer M.D on 01/01/2024 at 10:08 AM   -  To page go to [email or web address]

## 2024-01-01 NOTE — Consult Note (Addendum)
 [facility] Renal Consultation Note  Requesting MD: Dr. Zelda Hickman Indication for Consultation: Hypercalcemia  HPI:  Tony Cantu is a 73 y.o. male with PMH NASH cirrhosis, paroxysmal atrial fibrillation on eliquis  who was admitted with hepatic encephalopathy and UGIB with prolonged hospital course (today is HD 24) complicated by orthostatic hypotension and hypercalcemia.   Labs reviewed and notable for elevated calcium  10.4 (corrected 10.8), elevated ionized calcium  5.8, decreased calcitriol  22.4, normal vitamin D  62.9, decreased PTH 8. PTHrP has been ordered by primary team. Also noted to be mildly anemic though this has been stable. Anemia appears to be more chronic in nature, and hypercalcemia is acute over the last 2 weeks or so. Renal function WNL. TSH WNL.  Of note [name] explains that he infrequently gets up and mobilizes due to severe bilateral knee pain which limits his ability to move with ease. He endorses occasional spells of increased confusion but explains that he feels great this morning and thinks that he is doing well.  Creat  Date/Time Value Ref Range Status  01/07/2013 08:40 AM 0.82 0.50 - 1.35 mg/dL Final   Creatinine, Ser  Date/Time Value Ref Range Status  01/01/2024 03:30 AM 0.72 0.61 - 1.24 mg/dL Final  09/81/1914 78:29 AM 0.68 0.61 - 1.24 mg/dL Final  56/21/3086 57:84 AM 0.67 0.61 - 1.24 mg/dL Final  69/62/9528 41:32 AM 0.77 0.61 - 1.24 mg/dL Final  44/08/270 53:66 AM 0.74 0.61 - 1.24 mg/dL Final  44/10/4740 59:56 AM 0.78 0.61 - 1.24 mg/dL Final  38/75/6433 29:51 AM 0.80 0.61 - 1.24 mg/dL Final  88/41/6606 30:16 AM 0.63 0.61 - 1.24 mg/dL Final  09/06/3233 57:32 AM 0.68 0.61 - 1.24 mg/dL Final  20/25/4270 62:37 AM 0.73 0.61 - 1.24 mg/dL Final  62/83/1517 61:60 AM 0.85 0.61 - 1.24 mg/dL Final  73/71/0626 94:85 AM 0.82 0.61 - 1.24 mg/dL Final  46/27/0350 09:38 AM 0.97 0.61 - 1.24 mg/dL Final  18/29/9371 69:67 AM 1.10 0.61 - 1.24 mg/dL Final   89/38/1017 51:02 AM 0.86 0.61 - 1.24 mg/dL Final  58/52/7782 42:35 AM 0.85 0.61 - 1.24 mg/dL Final  36/14/4315 40:08 PM 0.85 0.61 - 1.24 mg/dL Final  67/61/9509 32:67 PM 0.87 0.61 - 1.24 mg/dL Final  12/45/8099 83:38 AM 0.82 0.76 - 1.27 mg/dL Final  25/12/3974 73:41 AM 0.72 0.61 - 1.24 mg/dL Final  93/79/0240 97:35 AM 0.70 0.61 - 1.24 mg/dL Final  32/99/2426 83:41 AM 0.81 0.61 - 1.24 mg/dL Final  96/22/2979 89:21 AM 0.83 0.61 - 1.24 mg/dL Final  19/41/7408 14:48 AM 0.78 0.61 - 1.24 mg/dL Final  18/56/3149 70:26 AM 0.82 0.61 - 1.24 mg/dL Final  37/85/8850 27:74 PM 0.88 0.61 - 1.24 mg/dL Final  12/87/8676 72:09 AM 0.92 0.61 - 1.24 mg/dL Final  47/04/6282 66:29 AM 1.14 0.61 - 1.24 mg/dL Final  47/65/4650 35:46 AM 1.38 (H) 0.61 - 1.24 mg/dL Final  56/81/2751 70:01 PM 1.83 (H) 0.61 - 1.24 mg/dL Final  74/94/4967 59:16 PM 2.09 (H) 0.61 - 1.24 mg/dL Final  38/46/6599 35:70 PM 2.07 (H) 0.61 - 1.24 mg/dL Final  17/79/3903 00:92 AM 0.81 0.61 - 1.24 mg/dL Final  33/00/7622 63:33 AM 0.93 0.61 - 1.24 mg/dL Final  54/56/2563 89:37 PM 0.97 0.61 - 1.24 mg/dL Final  34/28/7681 15:72 AM 0.81 0.61 - 1.24 mg/dL Final  62/10/5595 41:63 AM 0.77 0.61 - 1.24 mg/dL Final  84/53/6468 03:21 AM 0.88 0.61 - 1.24 mg/dL Final  22/48/2500 37:04 AM 0.95 0.61 - 1.24 mg/dL Final  88/89/1694 50:38  AM 1.13 0.61 - 1.24 mg/dL Final  84/69/6295 28:41 PM 0.98 0.61 - 1.24 mg/dL Final  32/44/0102 72:53 AM 0.97 0.61 - 1.24 mg/dL Final  66/44/0347 42:59 AM 1.17 0.61 - 1.24 mg/dL Final  56/38/7564 33:29 AM 1.14 0.76 - 1.27 mg/dL Final  51/88/4166 06:30 AM 1.17 0.76 - 1.27 mg/dL Final  16/08/930 35:57 PM 1.28 (H) 0.61 - 1.24 mg/dL Final  32/20/2542 70:62 AM 0.95 0.76 - 1.27 mg/dL Final  37/62/8315 17:61 AM 0.97 0.40 - 1.50 mg/dL Final  60/73/7106 26:94 AM 0.88 0.61 - 1.24 mg/dL Final  85/46/2703 50:09 AM 0.88 0.61 - 1.24 mg/dL Final  38/18/2993 71:69 AM 0.87 0.61 - 1.24 mg/dL Final  67/89/3810 17:51 PM 0.99 0.61 - 1.24  mg/dL Final   Past Medical History: Past Medical History:  Diagnosis Date   Acute renal injury (HCC) 08/06/2016   Aortic atherosclerosis (HCC)    Aortic stenosis    Arthritis    Asthma    BPH (benign prostatic hypertrophy)    Cirrhosis (HCC) 2016   Colon polyps    Diabetes mellitus without complication (HCC)    TYPE 1    Diverticulosis    DKA (diabetic ketoacidosis) (HCC) 10/25/2023   Dysrhythmia 04/19/2021   A-fib noted with possible A-fib RVR   Gastric ulcer    Gastric varices    right   Gastritis    GERD (gastroesophageal reflux disease)    Headache    History- pt states these were migraines that occurred in the 1980's   Heart murmur    History of kidney stones    Hx of adenomatous colonic polyps    Hypertension    Iron  deficiency anemia due to chronic blood loss 08/16/2023   Melena 09/20/2023   Portal hypertensive gastropathy (HCC)    Postoperative fever 03/10/2022   Renal cyst, right    Seizures (HCC)    HYPOGLYCEMIC LAST 1 AND 1/2 YRS AGO   Sepsis (HCC) 09/20/2023   Sleep apnea    uses CPAP nightly   Stroke-like symptom 09/20/2023   Testicle trouble    one testicle BORN WITH   Tubular adenoma of colon    Past Surgical History: Past Surgical History:  Procedure Laterality Date   BACK SURGERY     89  LOWER    CARPAL TUNNEL RELEASE Right 10/13/2015   Procedure: RIGHT CARPAL TUNNEL RELEASE;  Surgeon: Lyanne Sample, MD;  Location: [facility];  Service: Orthopedics;  Laterality: Right;   CARPAL TUNNEL RELEASE Left 07/05/2016   Procedure: LEFT CARPAL TUNNEL RELEASE;  Surgeon: Lyanne Sample, MD;  Location: [facility];  Service: Orthopedics;  Laterality: Left;   COLONOSCOPY WITH ESOPHAGOGASTRODUODENOSCOPY (EGD)  10/05/2022   DRUG INDUCED ENDOSCOPY N/A 07/02/2021   Procedure: DRUG INDUCED SLEEP ENDOSCOPY;  Surgeon: Virgina Grills, MD;  Location: [facility] OR;  Service: ENT;  Laterality: N/A;   ESOPHAGOGASTRODUODENOSCOPY N/A 12/13/2023   Procedure:  EGD (ESOPHAGOGASTRODUODENOSCOPY);  Surgeon: Daina Drum, MD;  Location: [facility] END[facility]COPY;  Service: Gastroenterology;  Laterality: N/A;   ESOPHAGOGASTRODUODENOSCOPY (EGD) WITH PROPOFOL  N/A 09/25/2023   Procedure: ESOPHAGOGASTRODUODENOSCOPY (EGD) WITH PROPOFOL ;  Surgeon: Albertina Hugger, MD;  Location: [facility] ENDOSCOPY;  Service: Gastroenterology;  Laterality: N/A;  EGD needs to be coordinated with TEE.  Patient has severe aortic stenosis, procedures need to be done to follow each other with 1 sedation   HOT HEMOSTASIS N/A 12/13/2023   Procedure: EGD, WITH ARGON PLASMA COAGULATION;  Surgeon: Daina Drum, MD;  Location: [facility]  ENDOSCOPY;  Service: Gastroenterology;  Laterality: N/A;   IMPLANTATION OF HYPOGLOSSAL NERVE STIMULATOR Right 10/01/2021   Procedure: IMPLANTATION OF HYPOGLOSSAL NERVE STIMULATOR;  Surgeon: Virgina Grills, MD;  Location: [facility] OR;  Service: ENT;  Laterality: Right;   INGUINAL HERNIA REPAIR  2003   right    KNEE ARTHROSCOPY Right 03/03/2016   LUMBAR DISC SURGERY  03/1995   Dr. Burr Cary, discectomy   LUMBAR LAMINECTOMY/DECOMPRESSION MICRODISCECTOMY Right 10/06/2016   Procedure: RIGHT LUMBAR THREE - LUMBAR FOUR  LAMINECTOMY, FORAMINOTOMY AND MICRODISCECTOMY;  Surgeon: Yvonna Herder, MD;  Location: [facility] OR;  Service: Neurosurgery;  Laterality: Right;   SHOULDER SURGERY  11/28/2005   left partial   SHOULDER SURGERY  07/14/2006   RIGHT   TEE WITHOUT CARDIOVERSION N/A 09/25/2023   Procedure: TRANSESOPHAGEAL ECHOCARDIOGRAM (TEE);  Surgeon: Mardell Shade, MD;  Location: [facility] ENDOSCOPY;  Service: Cardiovascular;  Laterality: N/A;   TONSILLECTOMY  AGE 79 OR 4   TOTAL KNEE ARTHROPLASTY Right 03/07/2022   Procedure: RIGHT TOTAL KNEE ARTHROPLASTY;  Surgeon: Wendolyn Hamburger, MD;  Location: [facility];  Service: Orthopedics;  Laterality: Right;   TOTAL KNEE ARTHROPLASTY Left 06/06/2022   Procedure: LEFT TOTAL KNEE ARTHROPLASTY;  Surgeon: Wendolyn Hamburger, MD;  Location: [facility];  Service: Orthopedics;   Laterality: Left;   TOTAL SHOULDER ARTHROPLASTY Right 11/01/2018   Procedure: RIGHT SHOULDER REVISION TO REVERSE TOTAL SHOULDER;  Surgeon: Sammye Cristal, MD;  Location: [facility];  Service: Orthopedics;  Laterality: Right;  CHOICE ANESTHESIA WITH INTERSCALENE BLOCK EXPAREL , NEEDS RNFA   Family History:  Family History  Problem Relation Age of Onset   Heart failure Mother    Heart attack Mother        in her 66's   Alzheimer's disease Mother    Diabetes Mother    Asthma Father    Suicidality Father        in his 66's   Allergic rhinitis Sister    Heart failure Maternal Grandmother    Rheum arthritis Maternal Grandmother    Breast cancer Maternal Grandmother    Heart attack Maternal Grandmother        in her 19's   Colon cancer Neg Hx    Esophageal cancer Neg Hx    Pancreatic cancer Neg Hx    Liver disease Neg Hx    Social History:  reports that he quit smoking about 20 years ago. His smoking use included cigarettes and pipe. He started smoking about 54 years ago. He has never used smokeless tobacco. He reports that he does not currently use alcohol. He reports that he does not use drugs.  Allergies:  Allergies  Allergen Reactions   Dimetapp Children's Cold-Cough Other (See Comments)    Chest discomfort    Ak-Mycin [Erythromycin] Diarrhea   Sulfonamide Derivatives Diarrhea   Medications: Prior to Admission medications   Medication Sig Start Date End Date Taking? Authorizing Provider  acetaminophen  (TYLENOL ) 500 MG tablet Take 1,000 mg by mouth every 6 (six) hours as needed for moderate pain.   Yes [provider]  apixaban  (ELIQUIS ) 5 MG TABS tablet Take 1 tablet (5 mg total) by mouth 2 (two) times daily. 06/08/23  Yes Gottschalk, Ashly M, DO  atorvastatin  (LIPITOR) 40 MG tablet Take 1 tablet (40 mg total) by mouth daily. Patient taking differently: Take 40 mg by mouth daily at 6 PM. 06/08/23  Yes Vicky Grange M, DO  b complex vitamins tablet Take 1 tablet by  mouth daily.   Yes [provider]  busPIRone (BUSPAR) 5 MG tablet Take 5 mg by mouth 2 (two) times daily.   Yes [provider]  dextromethorphan-guaiFENesin (ROBITUSSIN-DM) 10-100 MG/5ML liquid Take 10 mLs by mouth in the morning and at bedtime.   Yes [provider]  ergocalciferol  (VITAMIN D2) 1.25 MG (50000 UT) capsule Take 50,000 Units by mouth See admin instructions. Take 1 capsule (50,000 units) by mouth twice weekly on Wednesday and Sunday.   Yes [provider]  ferrous sulfate  325 (65 FE) MG EC tablet Take 1 tablet (325 mg total) by mouth daily with breakfast. 01/18/23  Yes Pennington, Rebekah M, PA-C  Glucagon , rDNA, (GLUCAGON  EMERGENCY) 1 MG KIT INJECT AS DIRECTED INTO  UPPER ARM, THIGH OR  BUTTOCKS AS NEEDED FOR  SEVERE HYPOGLYCEMIA. SEEK  MEDICAL ATTENTION AFTER USE 06/14/21  Yes Hershel Los F, FNP  Glucerna [facility]) LIQD Take 120-237 mLs by mouth See admin instructions. Drink 1 bottle 3 times daily, after meals at 1000, 1300 and 1800. If BS < 60, provide of Glucerna and recheck blood sugar in 15 minutes.   Yes [provider]  insulin  lispro (HUMALOG  KWIKPEN) 100 UNIT/ML KwikPen Inject 2-15 Units into the skin See admin instructions. Inject 2-15 units subcutaneously three times daily before meals and at bedtime, per sliding scale: 121-150 : 2 units 151-200 : 3 units 201-250 : 5 units 251-300 : 8 units 301-350 : 11 units 351-400 : 15 units  Notify provider if BS < 70 or > 400.   Yes [provider]  ipratropium-albuterol  (DUONEB) 0.5-2.5 (3) MG/3ML SOLN Take 3 mLs by nebulization every 6 (six) hours as needed. 12/21/23  Yes Ghimire, Estil Heman, MD  magnesium  oxide (MAG-OX) 400 (240 Mg) MG tablet Take 400 mg by mouth daily.   Yes [provider]  montelukast  (SINGULAIR ) 10 MG tablet Take 1 tablet (10 mg total) by mouth at bedtime. 06/08/23  Yes Vicky Grange M, DO  Multiple Vitamins-Minerals (MULTIVITAMIN WITH  MINERALS) tablet Take 1 tablet by mouth daily.   Yes [provider]  pantoprazole  (PROTONIX ) 40 MG tablet TAKE 1 TABLET BY MOUTH TWICE  DAILY BEFORE MEALS 10/16/23  Yes Pyrtle, Amber Bail, MD  potassium chloride  SA (KLOR-CON  M) 20 MEQ tablet Take 20 mEq by mouth daily.   Yes [provider]  ramelteon  (ROZEREM ) 8 MG tablet Take 1 tablet (8 mg total) by mouth at bedtime. 11/03/23  Yes Krishnan, Gokul, MD  amiodarone  (PACERONE ) 100 MG tablet Take 1 tablet (100 mg total) by mouth daily. 400 mg  po daily from 3/7 to 3/11 200 mg  po daily from 3/12 12/21/23   Ghimire, Estil Heman, MD  amoxicillin -clavulanate (AUGMENTIN ) 875-125 MG tablet Take 1 tablet by mouth 2 (two) times daily. Patient not taking: Reported on 12/08/2023    [provider]  insulin  glargine (LANTUS  SOLOSTAR) 100 UNIT/ML Solostar Pen Inject 18 Units into the skin every 12 (twelve) hours. 12/23/23   Ghimire, Estil Heman, MD  insulin  lispro (HUMALOG ) 100 UNIT/ML injection Inject 0.06 mLs (6 Units total) into the skin 3 (three) times daily before meals. 12/23/23   Ghimire, Estil Heman, MD  lactulose  (CHRONULAC ) 10 GM/15ML solution Take 45 mLs (30 g total) by mouth 3 (three) times daily. 12/21/23   Ghimire, Estil Heman, MD  melatonin 3 MG TABS tablet Take 1 tablet (3 mg total) by mouth at bedtime as needed. 12/21/23   Ghimire, Estil Heman, MD  midodrine  (PROAMATINE ) 10 MG tablet Take 1 tablet (10 mg total) by  mouth 3 (three) times daily with meals. 12/21/23   Ghimire, Estil Heman, MD  rifaximin  (XIFAXAN ) 550 MG TABS tablet Take 1 tablet (550 mg total) by mouth 2 (two) times daily. 12/21/23   Ghimire, Estil Heman, MD    I have reviewed the patient's current medications.  Labs:  Results for orders placed or performed during the hospital encounter of 12/08/23 (from the past 48 hours)  Glucose, capillary     Status: Abnormal   Collection Time: 12/30/23 12:26 PM  Result Value Ref Range   Glucose-Capillary 215 (H) 70 - 99 mg/dL    Comment:  Glucose reference range applies only to samples taken after fasting for at least 8 hours.  Glucose, capillary     Status: Abnormal   Collection Time: 12/30/23  4:46 PM  Result Value Ref Range   Glucose-Capillary 342 (H) 70 - 99 mg/dL    Comment: Glucose reference range applies only to samples taken after fasting for at least 8 hours.  Glucose, capillary     Status: Abnormal   Collection Time: 12/30/23  9:17 PM  Result Value Ref Range   Glucose-Capillary 161 (H) 70 - 99 mg/dL    Comment: Glucose reference range applies only to samples taken after fasting for at least 8 hours.  CBC     Status: Abnormal   Collection Time: 12/31/23  5:52 AM  Result Value Ref Range   WBC 6.1 4.0 - 10.5 K/uL   RBC 3.07 (L) 4.22 - 5.81 MIL/uL   Hemoglobin 10.5 (L) 13.0 - 17.0 g/dL   HCT 16.1 (L) 09.6 - 04.5 %   MCV 105.2 (H) 80.0 - 100.0 fL   MCH 34.2 (H) 26.0 - 34.0 pg   MCHC 32.5 30.0 - 36.0 g/dL   RDW 40.9 (H) 81.1 - 91.4 %   Platelets 158 150 - 400 K/uL   nRBC 0.0 0.0 - 0.2 %    Comment: Performed at [facility] Lab, [address], [place], [place] [address]  Comprehensive metabolic panel with GFR     Status: Abnormal   Collection Time: 12/31/23  6:27 AM  Result Value Ref Range   Sodium 144 135 - 145 mmol/L   Potassium 3.7 3.5 - 5.1 mmol/L   Chloride 105 98 - 111 mmol/L   CO2 24 22 - 32 mmol/L   Glucose, Bld 88 70 - 99 mg/dL    Comment: Glucose reference range applies only to samples taken after fasting for at least 8 hours.   BUN 8 8 - 23 mg/dL   Creatinine, Ser 6.21 0.61 - 1.24 mg/dL   Calcium  11.1 (H) 8.9 - 10.3 mg/dL   Total Protein 6.1 (L) 6.5 - 8.1 g/dL   Albumin  3.2 (L) 3.5 - 5.0 g/dL   AST 52 (H) 15 - 41 U/L   ALT 38 0 - 44 U/L   Alkaline Phosphatase 122 38 - 126 U/L   Total Bilirubin 1.2 0.0 - 1.2 mg/dL   GFR, Estimated >30 >86 mL/min    Comment: (NOTE) Calculated using the CKD-EPI Creatinine Equation (2021)    Anion gap 15 5 - 15    Comment: Performed at [facility]  Lab, [address] [place], [place] [address]  Magnesium      Status: None   Collection Time: 12/31/23  6:27 AM  Result Value Ref Range   Magnesium  1.7 1.7 - 2.4 mg/dL    Comment: Performed at [facility], [address] [place], [place]  [address]  TSH     Status: None   Collection Time: 12/31/23  6:27 AM  Result Value Ref Range   TSH 2.066 0.350 - 4.500 uIU/mL    Comment: Performed by a 3rd Generation assay with a functional sensitivity of <=0.01 uIU/mL. Performed at [facility], [address], [place], [place] [address]   Glucose, capillary     Status: None   Collection Time: 12/31/23  7:39 AM  Result Value Ref Range   Glucose-Capillary 90 70 - 99 mg/dL    Comment: Glucose reference range applies only to samples taken after fasting for at least 8 hours.  Glucose, capillary     Status: Abnormal   Collection Time: 12/31/23 11:27 AM  Result Value Ref Range   Glucose-Capillary 139 (H) 70 - 99 mg/dL    Comment: Glucose reference range applies only to samples taken after fasting for at least 8 hours.  Glucose, capillary     Status: Abnormal   Collection Time: 12/31/23  4:26 PM  Result Value Ref Range   Glucose-Capillary 277 (H) 70 - 99 mg/dL    Comment: Glucose reference range applies only to samples taken after fasting for at least 8 hours.  Glucose, capillary     Status: Abnormal   Collection Time: 12/31/23  9:18 PM  Result Value Ref Range   Glucose-Capillary 185 (H) 70 - 99 mg/dL    Comment: Glucose reference range applies only to samples taken after fasting for at least 8 hours.  Magnesium      Status: None   Collection Time: 01/01/24  3:30 AM  Result Value Ref Range   Magnesium  2.0 1.7 - 2.4 mg/dL    Comment: Performed at [facility], [address], [place], [place] [address]  Basic metabolic panel with GFR     Status: Abnormal   Collection Time: 01/01/24  3:30 AM  Result Value Ref Range   Sodium 140 135 - 145 mmol/L   Potassium 4.1 3.5 - 5.1 mmol/L    Chloride 106 98 - 111 mmol/L   CO2 24 22 - 32 mmol/L   Glucose, Bld 159 (H) 70 - 99 mg/dL    Comment: Glucose reference range applies only to samples taken after fasting for at least 8 hours.   BUN 8 8 - 23 mg/dL   Creatinine, Ser 9.14 0.61 - 1.24 mg/dL   Calcium  10.4 (H) 8.9 - 10.3 mg/dL   GFR, Estimated >78 >29 mL/min    Comment: (NOTE) Calculated using the CKD-EPI Creatinine Equation (2021)    Anion gap 10 5 - 15    Comment: Performed at [facility], [address] [place], [place] [address]  Ammonia     Status: Abnormal   Collection Time: 01/01/24  3:30 AM  Result Value Ref Range   Ammonia 45 (H) 9 - 35 umol/L    Comment: Performed at [facility], [address], [place], [place] [address]  CBC with Differential/Platelet     Status: Abnormal   Collection Time: 01/01/24  4:00 AM  Result Value Ref Range   WBC 6.0 4.0 - 10.5 K/uL   RBC 3.26 (L) 4.22 - 5.81 MIL/uL   Hemoglobin 11.3 (L) 13.0 - 17.0 g/dL   HCT 84.6 (L) 96.2 - 95.2 %   MCV 105.5 (H) 80.0 - 100.0 fL   MCH 34.7 (H) 26.0 - 34.0 pg   MCHC 32.8 30.0 - 36.0 g/dL   RDW 84.1 (H) 32.4 -  15.5 %   Platelets 157 150 - 400 K/uL   nRBC 0.0 0.0 - 0.2 %   Neutrophils Relative % 57 %   Neutro Abs 3.5 1.7 - 7.7 K/uL   Lymphocytes Relative 21 %   Lymphs Abs 1.2 0.7 - 4.0 K/uL   Monocytes Relative 18 %   Monocytes Absolute 1.1 (H) 0.1 - 1.0 K/uL   Eosinophils Relative 2 %   Eosinophils Absolute 0.1 0.0 - 0.5 K/uL   Basophils Relative 1 %   Basophils Absolute 0.1 0.0 - 0.1 K/uL   Immature Granulocytes 1 %   Abs Immature Granulocytes 0.03 0.00 - 0.07 K/uL    Comment: Performed at [facility], [address], [place], [place] [address]  Glucose, capillary     Status: Abnormal   Collection Time: 01/01/24  8:03 AM  Result Value Ref Range   Glucose-Capillary 143 (H) 70 - 99 mg/dL    Comment: Glucose reference range applies only to samples taken after fasting for at least 8 hours.   *Note: Due to a large  number of results and/or encounters for the requested time period, some results have not been displayed. A complete set of results can be found in Results Review.    ROS: Pertinent items are noted in HPI.  Physical Exam: Vitals:   01/01/24 0400 01/01/24 0800  BP: 99/64 114/68  Pulse: 69 65  Resp: 20 20  Temp: 97.8 F (36.6 C) 98.2 F (36.8 C)  SpO2: 100% 96%     General:Appears stated age, resting comfortably, in no acute distress. Heart:RRR. Negative for extremity edema. Lungs:Normal work of breathing on room air.  Abdomen:Soft, nontender, nondistended. Skin:Warm and dry. Some flaking over the scalp. Neuro:Oriented to self and location.  Assessment/Plan: 1.Hypercalcemia: Mild, acute. Given prolonged hospitalization I suspect this is related to immobilization rather than malignant cause. PTHrP has been ordered, calcitriol  and PTH levels are decreased, vitamin D  level is normal. Not on any obvious medications/supplements that are likely to contribute. Given anemia, could consider SPEP/UPEP/serum FLC to assess for myeloma; note renal function is WNL and CT abdomen and pelvis earlier in stay was negative for suspicious bone lesions. Recommend continuing to encourage patient to mobilize, avoid offending medications, maintain adequate hydration. Will increase LR rate to 100 cc/h. 2. Hypertension/volume: Euvolemic with BP WNL. No changes to current management. 3. Anemia: Appears to be more chronic in nature based on chart review. 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    Malen Scudder, DO Internal Medicine PGY-3 01/01/2024, 9:25 AM

## 2024-01-01 NOTE — Progress Notes (Signed)
 Physical Therapy Treatment Patient Details Name: Tony Cantu MRN: [id number] DOB: 08-14-1951 Today's Date: 01/01/2024   History of Present Illness Patient is 73 yo male admitted on 12/08/23 for AMS, nausea, vomiting, and dark stool. PMHx significant for type 1 diabetes, A-fib on Eliquis , seizure disorder, aortic stenosis, HTN, peptic ulcer disease, OA with bilateral knee replacement, polyclonal gammopathy, OSA on CPAP, GERD, COPD/asthma, chronic diastolic CHF, cirrhosis, portal hypertension, and BPH.    PT Comments  Requires up to mod assist +2 and RW to stabilize with sit to stand and step pivot transfer training today. Worked on seated balance and weight shifting. Shows some right neglect and favoring leftward lean initially but improved with training. Sitting up right in recliner at end of session. Encouraged more frequent LE mobility in bed and chair (however with limited memory will need reminded by staff.) Recommended RN use Stedy for transfers. Patient will continue to benefit from skilled physical therapy services to further improve independence with functional mobility.     If plan is discharge home, recommend the following: Two people to help with walking and/or transfers;A lot of help with bathing/dressing/bathroom;Assistance with cooking/housework;Direct supervision/assist for medications management;Direct supervision/assist for financial management;Assist for transportation;Supervision due to cognitive status   Can travel by private vehicle     No  Equipment Recommendations  None recommended by PT (TBD)    Recommendations for Other Services       Precautions / Restrictions Precautions Precautions: Fall Recall of Precautions/Restrictions: Impaired Precaution/Restrictions Comments: watch BP; Frequent loose stools Restrictions Weight Bearing Restrictions Per Provider Order: No     Mobility  Bed Mobility Overal bed mobility: Needs Assistance Bed Mobility: Rolling,  Sidelying to Sit Rolling: Min assist, Used rails Sidelying to sit: Mod assist, Used rails       General bed mobility comments: Min assist to roll while assting with peri-care due to bowel incontinence. Mod assist for trunk support to rise. Multimodal cues to bring LEs off bed.    Transfers Overall transfer level: Needs assistance Equipment used: Rolling walker (2 wheels) Transfers: Sit to/from Stand, Bed to chair/wheelchair/BSC Sit to Stand: Mod assist   Step pivot transfers: +2 physical assistance, +2 safety/equipment, Mod assist       General transfer comment: Mod assist for boost and balance to rise +2 for support and safety. max cues. Pt attempts to sit prematurely. Second round pt with improved upright posture. Thrid round pt able to step pivot tranfer but shows difficulty sequencing LLE to step adequately requiring up to mod assist +2 with RW and bil support.    Ambulation/Gait                   Stairs             Wheelchair Mobility     Tilt Bed    Modified Rankin (Stroke Patients Only)       Balance Overall balance assessment: Needs assistance Sitting-balance support: Feet supported, Bilateral upper extremity supported Sitting balance-Leahy Scale: Poor Sitting balance - Comments: CGA edge of chair. max cues to pull self forward while sitting in recliner. Postural control: Left lateral lean Standing balance support: Bilateral upper extremity supported, Reliant on assistive device for balance Standing balance-Leahy Scale: Poor Standing balance comment: Posterior lean.                            Communication Communication Communication: No apparent difficulties Factors Affecting Communication:  (mumbling; pt lethargic)  Cognition Arousal: Alert Behavior During Therapy: WFL for tasks assessed/performed   PT - Cognitive impairments: No family/caregiver present to determine baseline, Attention, Sequencing, Problem solving,  Safety/Judgement, Awareness, Memory, Initiation                       PT - Cognition Comments: Still with difficulty sequencing and initiating. Following commands: Impaired Following commands impaired: Only follows one step commands consistently    Cueing Cueing Techniques: Verbal cues, Tactile cues, Gestural cues, Visual cues  Exercises General Exercises - Lower Extremity Ankle Circles/Pumps: AROM, Both, 10 reps, Seated Long Arc Quad: Strengthening, Both, 10 reps, Seated Hip ABduction/ADduction: Strengthening, Both, 10 reps, Seated Other Exercises Other Exercises: Seated balance working midline control, reaching and leaning towards Rt side onto elbow.    General Comments General comments (skin integrity, edema, etc.): BP after transfer to chair 94/73. RN notified. HR 90, SpO2 96% on RA.      Pertinent Vitals/Pain Pain Assessment Pain Assessment: No/denies pain    Home Living                          Prior Function            PT Goals (current goals can now be found in the care plan section) Acute Rehab PT Goals Patient Stated Goal: Go to rehab in [place] PT Goal Formulation: Patient unable to participate in goal setting Time For Goal Achievement: 01/05/24 Potential to Achieve Goals: Fair Progress towards PT goals: Progressing toward goals    Frequency    Min 1X/week      PT Plan      Co-evaluation              AM-PAC PT "6 Clicks" Mobility   Outcome Measure  Help needed turning from your back to your side while in a flat bed without using bedrails?: A Little Help needed moving from lying on your back to sitting on the side of a flat bed without using bedrails?: A Little Help needed moving to and from a bed to a chair (including a wheelchair)?: Total Help needed standing up from a chair using your arms (e.g., wheelchair or bedside chair)?: Total Help needed to walk in hospital room?: Total Help needed climbing 3-5 steps with a railing?  : Total 6 Click Score: 10    End of Session Equipment Utilized During Treatment: Gait belt Activity Tolerance: Patient tolerated treatment well Patient left: with call bell/phone within reach;in chair;with chair alarm set Nurse Communication: Mobility status;Need for lift equipment (Recommended Stedy) PT Visit Diagnosis: Muscle weakness (generalized) (M62.81);Difficulty in walking, not elsewhere classified (R26.2);Other symptoms and signs involving the nervous system (R29.898);Dizziness and giddiness (R42) Pain - Right/Left: Right Pain - part of body:  (buttocks)     Time: 3235-5732 PT Time Calculation (min) (ACUTE ONLY): 25 min  Charges:    $Therapeutic Activity: 8-22 mins $Neuromuscular Re-education: 8-22 mins PT General Charges $$ ACUTE PT VISIT: 1 Visit                     Jory Ng, PT, DPT [facility]  Rehabilitation Services Physical Therapist Office: [phone] Website: [email or web address]    Alinda Irani 01/01/2024, 11:34 AM

## 2024-01-01 NOTE — Progress Notes (Signed)
   12/31/23 2315  BiPAP/CPAP/SIPAP  Reason BIPAP/CPAP not in use  (Pt declined cpap. States he has Inspire and no longer usesa cpap)  BiPAP/CPAP /SiPAP Vitals  Pulse Rate 63  SpO2 95 %

## 2024-01-02 DIAGNOSIS — K922 Gastrointestinal hemorrhage, unspecified: Secondary | ICD-10-CM | POA: Diagnosis not present

## 2024-01-02 LAB — COMPREHENSIVE METABOLIC PANEL WITH GFR
ALT: 42 U/L (ref 0–44)
AST: 64 U/L — ABNORMAL HIGH (ref 15–41)
Albumin: 3.3 g/dL — ABNORMAL LOW (ref 3.5–5.0)
Alkaline Phosphatase: 130 U/L — ABNORMAL HIGH (ref 38–126)
Anion gap: 10 (ref 5–15)
BUN: 7 mg/dL — ABNORMAL LOW (ref 8–23)
CO2: 24 mmol/L (ref 22–32)
Calcium: 10.2 mg/dL (ref 8.9–10.3)
Chloride: 103 mmol/L (ref 98–111)
Creatinine, Ser: 0.72 mg/dL (ref 0.61–1.24)
GFR, Estimated: >60 mL/min (ref >60)
Glucose, Bld: 225 mg/dL — ABNORMAL HIGH (ref 70–99)
Potassium: 3.7 mmol/L (ref 3.5–5.1)
Sodium: 137 mmol/L (ref 135–145)
Total Bilirubin: 1.1 mg/dL (ref 0.0–1.2)
Total Protein: 6.4 g/dL — ABNORMAL LOW (ref 6.5–8.1)

## 2024-01-02 LAB — CBC WITH DIFFERENTIAL/PLATELET
Abs Immature Granulocytes: 0.02 10*3/uL (ref 0.00–0.07)
Basophils Absolute: 0 10*3/uL (ref 0.0–0.1)
Basophils Relative: 1 %
Eosinophils Absolute: 0.1 10*3/uL (ref 0.0–0.5)
Eosinophils Relative: 1 %
HCT: 31.3 % — ABNORMAL LOW (ref 39.0–52.0)
Hemoglobin: 10.5 g/dL — ABNORMAL LOW (ref 13.0–17.0)
Immature Granulocytes: 0 %
Lymphocytes Relative: 21 %
Lymphs Abs: 1.1 10*3/uL (ref 0.7–4.0)
MCH: 34.7 pg — ABNORMAL HIGH (ref 26.0–34.0)
MCHC: 33.5 g/dL (ref 30.0–36.0)
MCV: 103.3 fL — ABNORMAL HIGH (ref 80.0–100.0)
Monocytes Absolute: 0.8 10*3/uL (ref 0.1–1.0)
Monocytes Relative: 15 %
Neutro Abs: 3.3 10*3/uL (ref 1.7–7.7)
Neutrophils Relative %: 62 %
Platelets: 108 10*3/uL — ABNORMAL LOW (ref 150–400)
RBC: 3.03 MIL/uL — ABNORMAL LOW (ref 4.22–5.81)
RDW: 15.9 % — ABNORMAL HIGH (ref 11.5–15.5)
WBC: 5.3 10*3/uL (ref 4.0–10.5)
nRBC: 0 % (ref 0.0–0.2)

## 2024-01-02 LAB — GLUCOSE, CAPILLARY
Glucose-Capillary: 196 mg/dL — ABNORMAL HIGH (ref 70–99)
Glucose-Capillary: 190 mg/dL — ABNORMAL HIGH (ref 70–99)
Glucose-Capillary: 160 mg/dL — ABNORMAL HIGH (ref 70–99)
Glucose-Capillary: 60 mg/dL — ABNORMAL LOW (ref 70–99)
Glucose-Capillary: 78 mg/dL (ref 70–99)

## 2024-01-02 LAB — PHOSPHORUS: Phosphorus: 2.7 mg/dL (ref 2.5–4.6)

## 2024-01-02 LAB — CALCIUM / CREATININE RATIO, URINE
Calcium, Ur: 13.7 mg/dL
Calcium/Creat.Ratio: 173 mg/g{creat} (ref 14–318)
Creatinine, Urine: 79.2 mg/dL

## 2024-01-02 LAB — MAGNESIUM: Magnesium: 1.9 mg/dL (ref 1.7–2.4)

## 2024-01-02 NOTE — Progress Notes (Signed)
 Occupational Therapy Treatment Patient Details Name: Tony Cantu MRN: [id number] DOB: February 04, 1951 Today's Date: 01/02/2024   History of present illness Patient is 73 yo male admitted on 12/08/23 for AMS, nausea, vomiting, and dark stool. PMHx significant for type 1 diabetes, A-fib on Eliquis , seizure disorder, aortic stenosis, HTN, peptic ulcer disease, OA with bilateral knee replacement, polyclonal gammopathy, OSA on CPAP, GERD, COPD/asthma, chronic diastolic CHF, cirrhosis, portal hypertension, and BPH.   OT comments  Pt making slow progress towards OT goals w/ limitations in endurance and due to bowel incontinence. Pt requires Mod A for bed mobility, Max A x 2 for brief standing attempts w/ heavy posterior bias today and unable to progress OOB w/ RW. Pt requires consistent support to maintain sitting balance EOB and Total A for LB ADLs including peri care after incontinence today. BP pre and post activity stable though pt endorsed dizziness. Patient will benefit from continued inpatient follow up therapy, <3 hours/day at [place].      If plan is discharge home, recommend the following:  Two people to help with walking and/or transfers;A lot of help with bathing/dressing/bathroom;Assistance with cooking/housework;Direct supervision/assist for medications management;Direct supervision/assist for financial management;Assist for transportation;Help with stairs or ramp for entrance;Supervision due to cognitive status   Equipment Recommendations  Other (comment) (TBD)    Recommendations for Other Services      Precautions / Restrictions Precautions Precautions: Fall Recall of Precautions/Restrictions: Impaired Precaution/Restrictions Comments: watch BP; Frequent loose stools Restrictions Weight Bearing Restrictions Per Provider Order: No       Mobility Bed Mobility Overal bed mobility: Needs Assistance Bed Mobility: Rolling, Supine to Sit, Sit to Supine Rolling: Mod assist, Used rails    Supine to sit: Mod assist, Used rails, HOB elevated Sit to supine: Mod assist, Used rails   General bed mobility comments: assist to initiate LE to EOB, cues for bedrail use and pushing trunk up. L lateral lean tendency. Attempted to lay down on L elbow multiple times requiring OT to sit EOB on this side to assist with balance. assist for BLE back to bed at end of session and Mod A to roll side to side for peri care    Transfers Overall transfer level: Needs assistance Equipment used: Rolling walker (2 wheels) Transfers: Sit to/from Stand Sit to Stand: Max assist, +2 physical assistance, +2 safety/equipment           General transfer comment: Max A x 2 to stand at bedside, feet sliding forward requriing blocking and pt unable to tuck bottom in     Balance Overall balance assessment: Needs assistance Sitting-balance support: Feet supported, Bilateral upper extremity supported Sitting balance-Leahy Scale: Poor   Postural control: Left lateral lean Standing balance support: Bilateral upper extremity supported, Reliant on assistive device for balance Standing balance-Leahy Scale: Poor                             ADL either performed or assessed with clinical judgement   ADL Overall ADL's : Needs assistance/impaired                     Lower Body Dressing: Total assistance;Bed level       Toileting- Clothing Manipulation and Hygiene: Total assistance;Bed level Toileting - Clothing Manipulation Details (indicate cue type and reason): for clean up bed level after bowel incontinence. unable to stand fully upright for cleanup assist so had to return to bed  Extremity/Trunk Assessment Upper Extremity Assessment Upper Extremity Assessment: Generalized weakness;Right hand dominant   Lower Extremity Assessment Lower Extremity Assessment: Defer to PT evaluation        Vision   Vision Assessment?: No apparent visual deficits Additional  Comments: questionable R inattention noted by PT in prior session; to be further assessed in next session. Pt able to reach to footboard on R with cues   Perception     Praxis     Communication Communication Communication: No apparent difficulties   Cognition Arousal: Alert Behavior During Therapy: [facility] for tasks assessed/performed Cognition: No family/caregiver present to determine baseline     Awareness: Intellectual awareness intact, Online awareness impaired Memory impairment (select all impairments): Short-term memory Attention impairment (select first level of impairment): Selective attention, Alternating attention Executive functioning impairment (select all impairments): Problem solving, Reasoning, Sequencing, Organization OT - Cognition Comments: some confusion noted regarding current medical situation, needs, etc. follows commands though multimodal cues needed for follow through                 Following commands: Impaired Following commands impaired: Only follows one step commands consistently, Follows multi-step commands inconsistently      Cueing   Cueing Techniques: Verbal cues, Tactile cues, Gestural cues, Visual cues  Exercises Other Exercises Other Exercises: Seated balance working midline control, reaching Other Exercises: BLE knee extension sitting EOB    Shoulder Instructions       General Comments BP stable    Pertinent Vitals/ Pain       Pain Assessment Pain Assessment: Faces Faces Pain Scale: Hurts a little bit Pain Location: buttocks Pain Descriptors / Indicators: Grimacing, Guarding Pain Intervention(s): Monitored during session, Limited activity within patient's tolerance  Home Living                                          Prior Functioning/Environment              Frequency  Min 1X/week        Progress Toward Goals  OT Goals(current goals can now be found in the care plan section)  Progress towards OT  goals: OT to reassess next treatment  Acute Rehab OT Goals Patient Stated Goal: fix bowel and BP issues OT Goal Formulation: With patient Time For Goal Achievement: 01/04/24 Potential to Achieve Goals: Fair ADL Goals Pt Will Perform Grooming: with set-up;sitting Pt Will Perform Upper Body Dressing: with supervision;sitting Pt Will Perform Lower Body Dressing: with mod assist;sit to/from stand;sitting/lateral leans Pt Will Transfer to Toilet: with min assist;stand pivot transfer Pt Will Perform Toileting - Clothing Manipulation and hygiene: with mod assist;sitting/lateral leans;sit to/from stand Additional ADL Goal #1: Pt will complete bed mobility with CGA in preparation for OOB functional tasks  Plan      Co-evaluation                 AM-PAC OT "6 Clicks" Daily Activity     Outcome Measure   Help from another person eating meals?: A Little Help from another person taking care of personal grooming?: A Little Help from another person toileting, which includes using toliet, bedpan, or urinal?: Total Help from another person bathing (including washing, rinsing, drying)?: A Lot Help from another person to put on and taking off regular upper body clothing?: A Little Help from another person to put on and taking off regular lower body  clothing?: Total 6 Click Score: 13    End of Session Equipment Utilized During Treatment: Gait belt;Rolling walker (2 wheels)  OT Visit Diagnosis: Unsteadiness on feet (R26.81);Other abnormalities of gait and mobility (R26.89);Muscle weakness (generalized) (M62.81);Other symptoms and signs involving cognitive function   Activity Tolerance Treatment limited secondary to medical complications (Comment);Patient limited by fatigue (limited by bowel incontinence)   Patient Left in bed;with call bell/phone within reach;with bed alarm set   Nurse Communication Mobility status        Time: 0454-0981 OT Time Calculation (min): 27 min  Charges: OT  General Charges $OT Visit: 1 Visit OT Treatments $Self Care/Home Management : 8-22 mins $Therapeutic Activity: 8-22 mins  Lawrence Pretty, OTR/L Acute Rehab Services Office: [phone]   Annabella Barr 01/02/2024, 11:48 AM

## 2024-01-02 NOTE — Plan of Care (Signed)
  Problem: Education: Goal: Knowledge of General Education information will improve Description: Including pain rating scale, medication(s)/side effects and non-pharmacologic comfort measures Outcome: Progressing   Problem: Health Behavior/Discharge Planning: Goal: Ability to manage health-related needs will improve Outcome: Progressing   Problem: Clinical Measurements: Goal: Ability to maintain clinical measurements within normal limits will improve Outcome: Progressing Goal: Will remain free from infection Outcome: Progressing   Problem: Activity: Goal: Risk for activity intolerance will decrease Outcome: Progressing   Problem: Nutrition: Goal: Adequate nutrition will be maintained Outcome: Progressing   Problem: Coping: Goal: Level of anxiety will decrease Outcome: Progressing   Problem: Elimination: Goal: Will not experience complications related to bowel motility Outcome: Progressing

## 2024-01-02 NOTE — Progress Notes (Signed)
 PROGRESS NOTE        PATIENT DETAILS Name: Tony Cantu Age: 73 y.o. Sex: male Date of Birth: [birth year] Admit Date: 12/08/2023 Admitting Physician Johnetta Nab, MD WUJ:[id number], Gwendalyn Lemma, DO  Brief Summary: Patient is a 73 y.o.  male with history of [name] cirrhosis, PAF on anticoagulation who presented with somnolence/fatigue-was found to have hepatic encephalopathy along with upper GI bleeding.  Hospital course complicated by orthostatic hypotension and mild hypercalcemia.  Significant events: 4/11>> admit to [facility]  Significant studies: 4/11>> CT abdomen/pelvis: Mild circumferential thickening of the rectum-favoring proctitis. 4/11>> CT head: No acute intracranial abnormality. 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  Consults: GI, nephrology  Subjective: Patient in bed, appears comfortable, denies any headache, no fever, no chest pain or pressure, no shortness of breath , no abdominal pain. No new focal weakness.   Objective: Vitals: Blood pressure 108/79, pulse 82, temperature 97.9 F (36.6 C), temperature source Oral, resp. rate 16, height 5' 7.99" (1.727 m), weight 74.8 kg, SpO2 92%.   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  Chronic HFpEF Euvolemic on exam Diuretics as needed  Hypokalemia Replete/recheck  Mild hypercalcemia Unclear etiology but suspect this is from immobilization-as he has been very reluctant to move for for the past a week or so.  Do not see any culprit medications PTH levels appropriately suppressed, stable vitamin D  levels, pending PTH RP levels Patient is very reluctant to move out of bed, extensively counseled patient and staff, mobility is highly essential. Per glycemia much improved after gentle IV fluids on 01/01/2024, appreciate nephrology input  PAF Telemetry monitoring Amiodarone  Eliquis .  HLD Continue to hold statins-trend LFTs.  Transaminitis Unclear etiology Downtrending Follow-remains on amiodarone -hence watch closely.  COPD/asthma Stable Bronchodilators  OSA Has been refusing CPAP here-but apparently uses at home  DM-2 (A1c 4.4 on 1/21) CBGs on the higher side Had hypoglycemia earlier-plan is to allow some amount of permissive hyperglycemia given for erratic oral intake CBGs stabilizing-mildly/borderline hypoglycemic this morning Decrease Semglee  to 10 units in p.m. Continue Semglee  24 units in a.m. Continue Premeal NovoLog  6 units Continue SSI Follow/optimize.    Recent Labs    01/01/24 1129 01/01/24 1530 01/01/24 2132  GLUCAP 261* 286* 223*    Failure to thrive syndrome Poor oral intake Continue PT/OT/nutrition eval Appears somewhat depressed-but per patient this is primarily due to his frustration of remaining in the hospital.  He does not want to talk to a  psychiatrist. Continue Remeron  that was started during  this hospitalization.  Debility/deconditioning Due to acute illness PT/OT eval-SNF recommended.Social worker following-private pay being explored by family  Code status:   Code Status: Limited: Do not attempt resuscitation (DNR) -DNR-LIMITED -Do Not Intubate/DNI    DVT Prophylaxis: Place TED hose Start: 12/14/23 1312 Place and maintain sequential compression device Start: 12/09/23 0555 SCDs Start: 12/08/23 1707 apixaban  (ELIQUIS ) tablet 5 mg    Family Communication: Ex Wife [name]- [phone]-updated on 5/2  Disposition Plan: Status is: Inpatient Remains inpatient appropriate because: Severity of illness   Planned Discharge Destination:Skilled nursing facility   Diet: Diet Order             Diet regular Room service appropriate? Yes; Fluid consistency: Thin  Diet effective now           Diet - low sodium heart healthy           Diet Carb Modified                     Antimicrobial agents: Anti-infectives (From admission, onward)    Start     Dose/Rate Route Frequency Ordered Stop   12/21/23 0000  rifaximin  (XIFAXAN ) 550 MG TABS tablet        550 mg Oral 2 times daily 12/21/23 0826     12/10/23 2200  rifaximin  (XIFAXAN ) tablet 550 mg        550 mg Oral 2 times daily 12/10/23 1815     12/09/23 1000  rifaximin  (XIFAXAN ) tablet 550 mg  Status:  Discontinued        550 mg Per Tube 2 times daily 12/09/23 0827 12/10/23 1815        MEDICATIONS: Scheduled Meds:  amiodarone   100 mg Oral Daily   apixaban   5 mg Oral BID   atorvastatin   40 mg Oral Daily   feeding supplement (GLUCERNA SHAKE)  237 mL Oral TID BM   insulin  aspart  0-5 Units Subcutaneous QHS   insulin  aspart  0-9 Units Subcutaneous TID WC   insulin  aspart  6 Units Subcutaneous TID WC   insulin  glargine-yfgn  10 Units Subcutaneous QHS   insulin  glargine-yfgn  24 Units Subcutaneous q morning   lactulose   30 g Oral TID   leptospermum manuka honey  1 Application Topical Daily   midodrine   10 mg Oral  TID WC   mirtazapine   15 mg Oral QHS   pantoprazole   40 mg Oral BID AC   rifaximin   550 mg Oral BID   sodium chloride  flush  3 mL Intravenous Q12H   Continuous Infusions:     PRN Meds:.acetaminophen  **OR** acetaminophen , diltiazem , melatonin   I have personally reviewed following labs and imaging studies  LABORATORY DATA: CBC: Recent Labs  Lab 12/31/23 0552 01/01/24 0400 01/02/24 0336  WBC 6.1 6.0 5.3  NEUTROABS  --  3.5 3.3  HGB 10.5* 11.3* 10.5*  HCT 32.3* 34.4* 31.3*  MCV 105.2* 105.5* 103.3*  PLT 158 157 108*    Basic Metabolic Panel: Recent Labs  Lab 12/27/23 0336 12/28/23 0446 12/29/23 0443 12/30/23 0403 12/31/23 0627 01/01/24 0330 01/02/24 0336  NA 139   < > 139 139 144 140 137  K 3.9   < > 3.6 3.3* 3.7 4.1 3.7  CL 105   < > 107 105 105 106 103  CO2 24   < > 24 25 24 24 24   GLUCOSE 157*   < > 199* 102* 88 159* 225*  BUN 7*   < > 7* 6* 8 8 7*  CREATININE 0.78   < > 0.77 0.67 0.68 0.72 0.72  CALCIUM  10.7*   < > 10.6* 10.6* 11.1* 10.4* 10.2  MG 1.9  --   --   --  1.7 2.0 1.9  PHOS  --   --   --   --   --   --  2.7   < > = values in this interval not displayed.     GFR: Estimated Creatinine Clearance: 78.4 mL/min (by C-G formula based on SCr of 0.72 mg/dL).  Liver Function Tests: Recent Labs  Lab 12/27/23 0336 12/31/23 0627 01/02/24 0336  AST 55* 52* 64*  ALT 39 38 42  ALKPHOS 133* 122 130*  BILITOT 1.5* 1.2 1.1  PROT 6.6 6.1* 6.4*  ALBUMIN  3.8 3.2* 3.3*    No results for input(s): "LIPASE", "AMYLASE" in the last 168 hours. Recent Labs  Lab 01/01/24 0330  AMMONIA 45*     Coagulation Profile: No results for input(s): "INR", "PROTIME" in the last 168 hours.  Cardiac Enzymes: No results for input(s): "CKTOTAL", "CKMB", "CKMBINDEX", "TROPONINI" in the last 168 hours.  BNP (last 3 results) No results for input(s): "PROBNP" in the last 8760 hours.  Lipid Profile: No results for input(s): "CHOL", "HDL", "LDLCALC", "TRIG",  "CHOLHDL", "LDLDIRECT" in the last 72 hours.  Thyroid  Function Tests: Recent Labs    12/31/23 0627  TSH 2.066    Anemia Panel: No results for input(s): "VITAMINB12", "FOLATE", "FERRITIN", "TIBC", "IRON ", "RETICCTPCT" in the last 72 hours.  Urine analysis:    Component Value Date/Time   COLORURINE YELLOW 12/08/2023 1733   APPEARANCEUR HAZY (A) 12/08/2023 1733   APPEARANCEUR Clear 07/05/2021 1129   LABSPEC 1.019 12/08/2023 1733   PHURINE 7.0 12/08/2023 1733   GLUCOSEU NEGATIVE 12/08/2023 1733   GLUCOSEU NEGATIVE 04/22/2021 1048   HGBUR NEGATIVE 12/08/2023 1733   BILIRUBINUR NEGATIVE 12/08/2023 1733   BILIRUBINUR Negative 07/05/2021 1129   KETONESUR NEGATIVE 12/08/2023 1733   PROTEINUR NEGATIVE 12/08/2023 1733   UROBILINOGEN 0.2 04/22/2021 1048   NITRITE NEGATIVE 12/08/2023 1733   LEUKOCYTESUR NEGATIVE 12/08/2023 1733    Sepsis Labs: Lactic Acid, Venous    Component Value Date/Time   LATICACIDVEN 1.6 12/08/2023 1406    MICROBIOLOGY: No results found for this or any previous visit (from the past 240 hours).  RADIOLOGY STUDIES/RESULTS: No results found.   LOS: 25 days   Signature  -    Lynnwood Sauer M.D on 01/02/2024 at 7:19 AM   -  To page go to [email or web address]

## 2024-01-02 NOTE — Progress Notes (Signed)
 Subjective:  Mr. Vonderheide feels well today though endorses some frustration and wanting to leave. He is pleasantly confused, wearing his glasses with one arm closed over the visual field. He is trying to get his gown off but cannot explain why. He endorses staying hydrated but does not want to eat his breakfast. He says the only things bothering him are his kidneys and being here, but cannot further elaborate.  Objective Vital signs in last 24 hours: Vitals:   01/01/24 1819 01/01/24 2000 01/02/24 0103 01/02/24 0400  BP: 118/71 (!) 141/73 108/81 108/79  Pulse: 72 84 79 82  Resp: 20 (!) 21 18 16   Temp: 98 F (36.7 C) 97.8 F (36.6 C) 97.9 F (36.6 C) 97.9 F (36.6 C)  TempSrc:  Oral Oral Oral  SpO2: 97% 95% 96% 92%  Weight:      Height:       Weight change:   Intake/Output Summary (Last 24 hours) at 01/02/2024 0812 Last data filed at 01/02/2024 1610 Gross per 24 hour  Intake 1443 ml  Output --  Net 1443 ml   Labs reviewed, remarkable for corrected calcium  of 10.4, total protein 6.4, albumin  3.3, normal magnesium , normal phosphorus; decrease in all blood cell lines. UOP 1.1L over last 24h.  Assessment/ Plan: Pt is a 73 y.o. yo male with PMH NASH cirrhosis, paroxysmal atrial fibrillation on eliquis  who was admitted on 12/08/2023 with hepatic encephalopathy and UGIB with prolonged hospital course that has been complicated by orthostatic hypotension and hypercalcemia.  1.Hypercalcemia: Remains mildly elevated when corrected for hypoalbuminemia. Suspect given information we have thus far, etiology remains immobilization. PTHrP, calcium /creatinine ratio pending. Continue to encourage patient to mobilize, avoid offending medications, maintain adequate hydration. 2. Hypertension/volume: Euvolemic with BP WNL. No changes to current management. 3. Anemia: Appears to be more chronic in nature based on chart review. 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   Labs: Basic Metabolic  Panel: Recent Labs  Lab 12/31/23 0627 01/01/24 0330 01/02/24 0336  NA 144 140 137  K 3.7 4.1 3.7  CL 105 106 103  CO2 24 24 24   GLUCOSE 88 159* 225*  BUN 8 8 7*  CREATININE 0.68 0.72 0.72  CALCIUM  11.1* 10.4* 10.2  PHOS  --   --  2.7   Liver Function Tests: Recent Labs  Lab 12/27/23 0336 12/31/23 0627 01/02/24 0336  AST 55* 52* 64*  ALT 39 38 42  ALKPHOS 133* 122 130*  BILITOT 1.5* 1.2 1.1  PROT 6.6 6.1* 6.4*  ALBUMIN  3.8 3.2* 3.3*   No results for input(s): "LIPASE", "AMYLASE" in the last 168 hours. Recent Labs  Lab 01/01/24 0330  AMMONIA 45*   CBC: Recent Labs  Lab 12/31/23 0552 01/01/24 0400 01/02/24 0336  WBC 6.1 6.0 5.3  NEUTROABS  --  3.5 3.3  HGB 10.5* 11.3* 10.5*  HCT 32.3* 34.4* 31.3*  MCV 105.2* 105.5* 103.3*  PLT 158 157 108*   Cardiac Enzymes: No results for input(s): "CKTOTAL", "CKMB", "CKMBINDEX", "TROPONINI" in the last 168 hours. CBG: Recent Labs  Lab 12/31/23 2118 01/01/24 0803 01/01/24 1129 01/01/24 1530 01/01/24 2132  GLUCAP 185* 143* 261* 286* 223*   Iron  Studies: No results for input(s): "IRON ", "TIBC", "TRANSFERRIN", "FERRITIN" in the last 72 hours. Studies/Results: No results found. Medications:  Scheduled Medications:  amiodarone   100 mg Oral Daily   apixaban   5 mg Oral BID   atorvastatin   40 mg Oral Daily   feeding supplement (GLUCERNA SHAKE)  237 mL Oral TID BM   insulin  aspart  0-5 Units Subcutaneous QHS   insulin  aspart  0-9 Units Subcutaneous TID WC   insulin  aspart  6 Units Subcutaneous TID WC   insulin  glargine-yfgn  10 Units Subcutaneous QHS   insulin  glargine-yfgn  24 Units Subcutaneous q morning   lactulose   30 g Oral TID   leptospermum manuka honey  1 Application Topical Daily   midodrine   10 mg Oral TID WC   mirtazapine   15 mg Oral QHS   pantoprazole   40 mg Oral BID AC   rifaximin   550 mg Oral BID   sodium chloride  flush  3 mL Intravenous Q12H   I have reviewed scheduled and prn  medications.  Physical Exam: General:Appears stated age, enjoying his breakfast in bed. In no acute distress. Heart:Regular rate and rhythm. Lungs:Normal work of breathing on room air. Extremities:Warm and dry. Negative for extremity edema.  Malen Scudder, DO Internal Medicine PGY-3 01/02/2024,8:12 AM  LOS: 25 days

## 2024-01-03 DIAGNOSIS — K922 Gastrointestinal hemorrhage, unspecified: Secondary | ICD-10-CM | POA: Diagnosis not present

## 2024-01-03 LAB — GLUCOSE, CAPILLARY
Glucose-Capillary: 193 mg/dL — ABNORMAL HIGH (ref 70–99)
Glucose-Capillary: 193 mg/dL — ABNORMAL HIGH (ref 70–99)
Glucose-Capillary: 212 mg/dL — ABNORMAL HIGH (ref 70–99)
Glucose-Capillary: 266 mg/dL — ABNORMAL HIGH (ref 70–99)
Glucose-Capillary: 311 mg/dL — ABNORMAL HIGH (ref 70–99)
Glucose-Capillary: 83 mg/dL (ref 70–99)

## 2024-01-03 LAB — COMPREHENSIVE METABOLIC PANEL WITH GFR
ALT: 40 U/L (ref 0–44)
AST: 63 U/L — ABNORMAL HIGH (ref 15–41)
Albumin: 3 g/dL — ABNORMAL LOW (ref 3.5–5.0)
Alkaline Phosphatase: 119 U/L (ref 38–126)
Anion gap: 10 (ref 5–15)
BUN: 7 mg/dL — ABNORMAL LOW (ref 8–23)
CO2: 23 mmol/L (ref 22–32)
Calcium: 10 mg/dL (ref 8.9–10.3)
Chloride: 104 mmol/L (ref 98–111)
Creatinine, Ser: 0.73 mg/dL (ref 0.61–1.24)
GFR, Estimated: >60 mL/min (ref >60)
Glucose, Bld: 290 mg/dL — ABNORMAL HIGH (ref 70–99)
Potassium: 4.1 mmol/L (ref 3.5–5.1)
Sodium: 137 mmol/L (ref 135–145)
Total Bilirubin: 1 mg/dL (ref 0.0–1.2)
Total Protein: 6.2 g/dL — ABNORMAL LOW (ref 6.5–8.1)

## 2024-01-03 LAB — CBC WITH DIFFERENTIAL/PLATELET
Abs Immature Granulocytes: 0.02 10*3/uL (ref 0.00–0.07)
Basophils Absolute: 0 10*3/uL (ref 0.0–0.1)
Basophils Relative: 1 %
Eosinophils Absolute: 0 10*3/uL (ref 0.0–0.5)
Eosinophils Relative: 1 %
HCT: 30.9 % — ABNORMAL LOW (ref 39.0–52.0)
Hemoglobin: 10.1 g/dL — ABNORMAL LOW (ref 13.0–17.0)
Immature Granulocytes: 0 %
Lymphocytes Relative: 17 %
Lymphs Abs: 1 10*3/uL (ref 0.7–4.0)
MCH: 34.9 pg — ABNORMAL HIGH (ref 26.0–34.0)
MCHC: 32.7 g/dL (ref 30.0–36.0)
MCV: 106.9 fL — ABNORMAL HIGH (ref 80.0–100.0)
Monocytes Absolute: 1 10*3/uL (ref 0.1–1.0)
Monocytes Relative: 16 %
Neutro Abs: 3.9 10*3/uL (ref 1.7–7.7)
Neutrophils Relative %: 65 %
Platelets: 133 10*3/uL — ABNORMAL LOW (ref 150–400)
RBC: 2.89 MIL/uL — ABNORMAL LOW (ref 4.22–5.81)
RDW: 16.1 % — ABNORMAL HIGH (ref 11.5–15.5)
WBC: 6 10*3/uL (ref 4.0–10.5)
nRBC: 0 % (ref 0.0–0.2)

## 2024-01-03 LAB — MAGNESIUM: Magnesium: 1.8 mg/dL (ref 1.7–2.4)

## 2024-01-03 LAB — PHOSPHORUS: Phosphorus: 3.2 mg/dL (ref 2.5–4.6)

## 2024-01-03 MED ORDER — INSULIN GLARGINE-YFGN 100 UNIT/ML ~~LOC~~ SOLN
6.0000 [IU] | Freq: Once | SUBCUTANEOUS | Status: AC
  Administered 2024-01-03: 6 [IU] via SUBCUTANEOUS
  Filled 2024-01-03: qty 0.06

## 2024-01-03 MED ORDER — INSULIN ASPART 100 UNIT/ML IJ SOLN
5.0000 [IU] | Freq: Once | INTRAMUSCULAR | Status: AC
  Administered 2024-01-03: 5 [IU] via SUBCUTANEOUS

## 2024-01-03 MED ORDER — INSULIN GLARGINE-YFGN 100 UNIT/ML ~~LOC~~ SOLN: 4.0000 [IU] | Freq: Every day | SUBCUTANEOUS | Status: DC

## 2024-01-03 MED ORDER — INSULIN GLARGINE-YFGN 100 UNIT/ML ~~LOC~~ SOLN
4.0000 [IU] | Freq: Every day | SUBCUTANEOUS | Status: DC
  Administered 2024-01-03 – 2024-01-04 (×3): 4 [IU] via SUBCUTANEOUS
  Filled 2024-01-03 (×4): qty 0.04

## 2024-01-03 MED ORDER — INSULIN GLARGINE-YFGN 100 UNIT/ML ~~LOC~~ SOLN
30.0000 [IU] | Freq: Every morning | SUBCUTANEOUS | Status: DC
  Administered 2024-01-04 – 2024-01-06 (×3): 30 [IU] via SUBCUTANEOUS
  Filled 2024-01-03 (×4): qty 0.3

## 2024-01-03 NOTE — Progress Notes (Signed)
 Patient ID: Tony Cantu, male   DOB: 01/10/1951, 73 y.o.   MRN: [id number] [facility] Progress Note   Assessment/ Plan:   1.  Hypercalcemia: With appropriately suppressed PTH, normal 25-hydroxy vitamin D  level and low 1-25-hydroxy vitamin D  level.  No evidence of monoclonal gammopathy or hypocalciuria.  The etiology suspected to be from immobilization.  Continue supportive management with intravenous fluids as indicated and cautious use of loop diuretics (with hypotension) if needed.  Nephrology will sign off and remain available for questions or concerns. 2.  Anemia: Appears to be chronic and recently complicated by upper GI bleed secondary to arteriovenous malformations.  No overt blood loss and hemoglobin/hematocrit with slight downtrend noted. 3.  Hypertension: Blood pressure currently within acceptable range on midodrine . 4.  Atrial fibrillation: Rate controlled and on anticoagulation with Eliquis .  Subjective:   No acute events overnight, remains confused wanting to go home.   Objective:   BP 114/77 (BP Location: Left Arm)   Pulse 75   Temp 97.8 F (36.6 C) (Oral)   Resp 16   Ht 5' 7.99" (1.727 m)   Wt 74.8 kg   SpO2 98%   BMI 25.08 kg/m   Intake/Output Summary (Last 24 hours) at 01/03/2024 1000 Last data filed at 01/03/2024 0753 Gross per 24 hour  Intake 3 ml  Output 2400 ml  Net -2397 ml   Weight change:   Physical Exam: Gen: Pleasantly confused, resting comfortably in bed CVS: Pulse regular rhythm, normal rate, S1 and S2 normal Resp: Diminished breath sounds over bases, no distinct rales or rhonchi Abd: Soft, flat, nontender, bowel sounds normal Ext: No lower extremity edema  Imaging: No results found.  Labs: BMET Recent Labs  Lab 12/28/23 0446 12/29/23 0443 12/30/23 0403 12/31/23 0627 01/01/24 0330 01/02/24 0336 01/03/24 0338  NA 140 139 139 144 140 137 137  K 3.3* 3.6 3.3* 3.7 4.1 3.7 4.1  CL 106 107 105 105 106 103 104  CO2 22 24 25 24  24 24 23   GLUCOSE 262* 199* 102* 88 159* 225* 290*  BUN 7* 7* 6* 8 8 7* 7*  CREATININE 0.74 0.77 0.67 0.68 0.72 0.72 0.73  CALCIUM  10.7* 10.6* 10.6* 11.1* 10.4* 10.2 10.0  PHOS  --   --   --   --   --  2.7 3.2   CBC Recent Labs  Lab 12/31/23 0552 01/01/24 0400 01/02/24 0336 01/03/24 0338  WBC 6.1 6.0 5.3 6.0  NEUTROABS  --  3.5 3.3 3.9  HGB 10.5* 11.3* 10.5* 10.1*  HCT 32.3* 34.4* 31.3* 30.9*  MCV 105.2* 105.5* 103.3* 106.9*  PLT 158 157 108* 133*    Medications:     amiodarone   100 mg Oral Daily   apixaban   5 mg Oral BID   atorvastatin   40 mg Oral Daily   feeding supplement (GLUCERNA SHAKE)  237 mL Oral TID BM   insulin  aspart  0-5 Units Subcutaneous QHS   insulin  aspart  0-9 Units Subcutaneous TID WC   insulin  aspart  6 Units Subcutaneous TID WC   insulin  glargine-yfgn  24 Units Subcutaneous q morning   insulin  glargine-yfgn  4 Units Subcutaneous QHS   lactulose   30 g Oral TID   leptospermum manuka honey  1 Application Topical Daily   midodrine   10 mg Oral TID WC   mirtazapine   15 mg Oral QHS   pantoprazole   40 mg Oral BID AC   rifaximin   550 mg Oral BID  sodium chloride  flush  3 mL Intravenous Q12H    Clevester Dally, MD 01/03/2024, 10:00 AM

## 2024-01-03 NOTE — TOC Progression Note (Signed)
 Transition of Care [facility]) - Progression Note    Patient Details  Name: [name] MRN: [id number] Date of Birth: 1950-09-26  Transition of Care [facility]) CM/SW Contact  Jannice Mends, LCSW Phone Number: 01/03/2024, 9:09 AM  Clinical Narrative:    CSW received request from [name] to send referral to [facility]. CSW received call from Crystal there and sent her referral as requested to [email or web address].   Expected Discharge Plan: Skilled Nursing Facility Barriers to Discharge: Continued Medical Work up, English as a second language teacher  Expected Discharge Plan and Services In-house Referral: Clinical Social Work   Post Acute Care Choice: Skilled Nursing Facility Living arrangements for the past 2 months: Single Family Home, Skilled Nursing Facility Expected Discharge Date: 12/21/23                                     Social Determinants of Health (SDOH) Interventions SDOH Screenings   Food Insecurity: Patient Unable To Answer (12/09/2023)  Housing: Patient Unable To Answer (12/09/2023)  Recent Concern: Housing - High Risk (10/30/2023)  Transportation Needs: Patient Unable To Answer (12/09/2023)  Utilities: Patient Unable To Answer (12/09/2023)  Depression (PHQ2-9): Medium Risk (09/13/2023)  Financial Resource Strain: Low Risk  (06/09/2022)  Social Connections: Patient Unable To Answer (12/09/2023)  Recent Concern: Social Connections - Socially Isolated (10/30/2023)  Tobacco Use: Medium Risk (12/08/2023)    Readmission Risk Interventions     No data to display

## 2024-01-03 NOTE — Progress Notes (Signed)
 PROGRESS NOTE        PATIENT DETAILS Name: Tony Cantu Age: 73 y.o. Sex: male Date of Birth: 08-23-1951 Admit Date: 12/08/2023 Admitting Physician Johnetta Nab, MD ZOX:[id number], Gwendalyn Lemma, DO  Brief Summary: Patient is a 73 y.o.  male with history of [name] cirrhosis, PAF on anticoagulation who presented with somnolence/fatigue-was found to have hepatic encephalopathy along with upper GI bleeding.  Hospital course complicated by orthostatic hypotension and mild hypercalcemia.  Significant events: 4/11>> admit to [facility]  Significant studies: 4/11>> CT abdomen/pelvis: Mild circumferential thickening of the rectum-favoring proctitis. 4/11>> CT head: No acute intracranial abnormality. 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  Consults: GI, nephrology  Subjective: Patient in bed, appears comfortable, denies any headache, no fever, no chest pain or pressure, no shortness of breath , no abdominal pain. No new focal weakness.   Objective: Vitals: Blood pressure 114/77, pulse 75, temperature 97.8 F (36.6 C), temperature source Oral, resp. rate 16, height 5' 7.99" (1.727 m), weight 74.8 kg, SpO2 98%.   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  Chronic HFpEF Euvolemic on exam Diuretics as needed  Hypokalemia Replete/recheck  Mild hypercalcemia Unclear etiology but suspect this is from immobilization-as he has been very reluctant to move for for the past a week or so.  Do not see any culprit medications PTH levels appropriately suppressed, stable vitamin D  levels, pending PTH RP levels Patient is very reluctant to move out of bed, extensively counseled patient and staff, mobility is highly essential. Per glycemia much improved after gentle IV fluids on 01/01/2024, appreciate nephrology input  PAF Telemetry monitoring Amiodarone  Eliquis .  HLD Continue to hold statins-trend LFTs.  Transaminitis Unclear etiology Downtrending Follow-remains on amiodarone -hence watch closely.  COPD/asthma Stable Bronchodilators  OSA Has been refusing CPAP here-but apparently uses at home  Failure to thrive syndrome Poor oral intake Continue PT/OT/nutrition eval Appears somewhat depressed-but per patient this is primarily due to his frustration of remaining in the hospital.  He does not want to talk to a psychiatrist. Continue Remeron  that was started during this hospitalization.  DM-2 (A1c 4.4 on 1/21) CBGs on the higher side Had hypoglycemia earlier-plan is to allow some amount of permissive hyperglycemia given for erratic oral intake CBGs stabilizing-mildly/borderline hypoglycemic this morning Decrease Semglee  to 10 units in p.m. On Semglee  24 units, will adjust further for better control Continue Premeal NovoLog  6 units Continue SSI Follow/optimize.    Recent Labs  01/03/24 0021  01/03/24 0629 01/03/24 0752  GLUCAP 193* 266* 311*     Debility/deconditioning Due to acute illness PT/OT eval-SNF recommended.Social worker following-private pay being explored by family  Code status:   Code Status: Limited: Do not attempt resuscitation (DNR) -DNR-LIMITED -Do Not Intubate/DNI    DVT Prophylaxis: Place TED hose Start: 12/14/23 1312 Place and maintain sequential compression device Start: 12/09/23 0555 SCDs Start: 12/08/23 1707 apixaban  (ELIQUIS ) tablet 5 mg    Family Communication: Ex Wife [name]- [phone]-updated on 5/2  Disposition Plan: Status is: Inpatient Remains inpatient appropriate because: Severity of illness   Planned Discharge Destination:Skilled nursing facility   Diet: Diet Order             Diet regular Room service appropriate? Yes; Fluid consistency: Thin  Diet effective now           Diet - low sodium heart healthy           Diet Carb Modified                     Antimicrobial agents: Anti-infectives (From admission, onward)    Start     Dose/Rate Route Frequency Ordered Stop   12/21/23 0000  rifaximin  (XIFAXAN ) 550 MG TABS tablet        550 mg Oral 2 times daily 12/21/23 0826     12/10/23 2200  rifaximin  (XIFAXAN ) tablet 550 mg        550 mg Oral 2 times daily 12/10/23 1815     12/09/23 1000  rifaximin  (XIFAXAN ) tablet 550 mg  Status:  Discontinued        550 mg Per Tube 2 times daily 12/09/23 0827 12/10/23 1815        MEDICATIONS: Scheduled Meds:  amiodarone   100 mg Oral Daily   apixaban   5 mg Oral BID   atorvastatin   40 mg Oral Daily   feeding supplement (GLUCERNA SHAKE)  237 mL Oral TID BM   insulin  aspart  0-5 Units Subcutaneous QHS   insulin  aspart  0-9 Units Subcutaneous TID WC   insulin  aspart  6 Units Subcutaneous TID WC   insulin  glargine-yfgn  24 Units Subcutaneous q morning   insulin  glargine-yfgn  4 Units Subcutaneous QHS   lactulose   30 g Oral TID   leptospermum manuka honey  1 Application  Topical Daily   midodrine   10 mg Oral TID WC   mirtazapine   15 mg Oral QHS   pantoprazole   40 mg Oral BID AC   rifaximin   550 mg Oral BID   sodium chloride  flush  3 mL Intravenous Q12H   Continuous Infusions:  PRN Meds:.acetaminophen  **OR** acetaminophen , diltiazem , melatonin   I have personally reviewed following labs and imaging studies  LABORATORY DATA: CBC: Recent Labs  Lab 12/31/23 0552 01/01/24 0400 01/02/24 0336 01/03/24 0338  WBC 6.1 6.0 5.3 6.0  NEUTROABS  --  3.5 3.3 3.9  HGB 10.5* 11.3* 10.5* 10.1*  HCT 32.3* 34.4* 31.3* 30.9*  MCV 105.2* 105.5* 103.3* 106.9*  PLT 158 157 108* 133*    Basic Metabolic Panel: Recent Labs  Lab 12/30/23 0403 12/31/23 0627 01/01/24 0330 01/02/24 0336 01/03/24 0338  NA 139 144 140 137 137  K 3.3* 3.7 4.1 3.7 4.1  CL 105 105 106 103 104  CO2 25 24 24 24 23   GLUCOSE 102* 88 159* 225* 290*  BUN 6* 8 8 7* 7*  CREATININE 0.67 0.68 0.72 0.72 0.73  CALCIUM  10.6* 11.1* 10.4* 10.2  10.0  MG  --  1.7 2.0 1.9 1.8  PHOS  --   --   --  2.7 3.2     GFR: Estimated Creatinine Clearance: 78.4 mL/min (by C-G formula based on SCr of 0.73 mg/dL).  Liver Function Tests: Recent Labs  Lab 12/31/23 0627 01/02/24 0336 01/03/24 0338  AST 52* 64* 63*  ALT 38 42 40  ALKPHOS 122 130* 119  BILITOT 1.2 1.1 1.0  PROT 6.1* 6.4* 6.2*  ALBUMIN  3.2* 3.3* 3.0*    No results for input(s): "LIPASE", "AMYLASE" in the last 168 hours. Recent Labs  Lab 01/01/24 0330  AMMONIA 45*     Coagulation Profile: No results for input(s): "INR", "PROTIME" in the last 168 hours.  Cardiac Enzymes: No results for input(s): "CKTOTAL", "CKMB", "CKMBINDEX", "TROPONINI" in the last 168 hours.  BNP (last 3 results) No results for input(s): "PROBNP" in the last 8760 hours.  Lipid Profile: No results for input(s): "CHOL", "HDL", "LDLCALC", "TRIG", "CHOLHDL", "LDLDIRECT" in the last 72 hours.  Thyroid  Function Tests: No results for input(s): "TSH",  "T4TOTAL", "FREET4", "T3FREE", "THYROIDAB" in the last 72 hours.   Anemia Panel: No results for input(s): "VITAMINB12", "FOLATE", "FERRITIN", "TIBC", "IRON ", "RETICCTPCT" in the last 72 hours.  Urine analysis:    Component Value Date/Time   COLORURINE YELLOW 12/08/2023 1733   APPEARANCEUR HAZY (A) 12/08/2023 1733   APPEARANCEUR Clear 07/05/2021 1129   LABSPEC 1.019 12/08/2023 1733   PHURINE 7.0 12/08/2023 1733   GLUCOSEU NEGATIVE 12/08/2023 1733   GLUCOSEU NEGATIVE 04/22/2021 1048   HGBUR NEGATIVE 12/08/2023 1733   BILIRUBINUR NEGATIVE 12/08/2023 1733   BILIRUBINUR Negative 07/05/2021 1129   KETONESUR NEGATIVE 12/08/2023 1733   PROTEINUR NEGATIVE 12/08/2023 1733   UROBILINOGEN 0.2 04/22/2021 1048   NITRITE NEGATIVE 12/08/2023 1733   LEUKOCYTESUR NEGATIVE 12/08/2023 1733    Sepsis Labs: Lactic Acid, Venous    Component Value Date/Time   LATICACIDVEN 1.6 12/08/2023 1406    MICROBIOLOGY: No results found for this or any previous visit (from the past 240 hours).  RADIOLOGY STUDIES/RESULTS: No results found.   LOS: 26 days   Signature  -    Lynnwood Sauer M.D on 01/03/2024 at 10:01 AM   -  To page go to [email or web address]

## 2024-01-03 NOTE — TOC Progression Note (Signed)
 Transition of Care [facility]) - Progression Note    Patient Details  Name: Tony Cantu MRN: [id number] Date of Birth: 1951-06-04  [facility]) CM/SW Contact  Jannice Mends, LCSW Phone Number: 01/03/2024, 4:06 PM  Clinical Narrative:    [facility] unable to [facility] patient due to SNF recommended.    Expected Discharge Plan: Skilled Nursing Facility Barriers to Discharge: Continued Medical Work up, English as a second language teacher  Expected Discharge Plan and Services In-house Referral: Clinical Social Work   Post Acute Care Choice: Skilled Nursing Facility Living arrangements for the past 2 months: Single Family Home, Skilled Nursing Facility Expected Discharge Date: 12/21/23                                     Social Determinants of Health (SDOH) Interventions SDOH Screenings   Food Insecurity: Patient Unable To Answer (12/09/2023)  Housing: Patient Unable To Answer (12/09/2023)  Recent Concern: Housing - High Risk (10/30/2023)  Transportation Needs: Patient Unable To Answer (12/09/2023)  Utilities: Patient Unable To Answer (12/09/2023)  Depression (PHQ2-9): Medium Risk (09/13/2023)  Financial Resource Strain: Low Risk  (06/09/2022)  Social Connections: Patient Unable To Answer (12/09/2023)  Recent Concern: Social Connections - Socially Isolated (10/30/2023)  Tobacco Use: Medium Risk (12/08/2023)    Readmission Risk Interventions     No data to display

## 2024-01-03 NOTE — Plan of Care (Signed)

## 2024-01-04 DIAGNOSIS — K922 Gastrointestinal hemorrhage, unspecified: Secondary | ICD-10-CM | POA: Diagnosis not present

## 2024-01-04 LAB — CBC WITH DIFFERENTIAL/PLATELET
Abs Immature Granulocytes: 0.01 10*3/uL (ref 0.00–0.07)
Basophils Absolute: 0 10*3/uL (ref 0.0–0.1)
Basophils Relative: 1 %
Eosinophils Absolute: 0.1 10*3/uL (ref 0.0–0.5)
Eosinophils Relative: 2 %
HCT: 32.5 % — ABNORMAL LOW (ref 39.0–52.0)
Hemoglobin: 10.7 g/dL — ABNORMAL LOW (ref 13.0–17.0)
Immature Granulocytes: 0 %
Lymphocytes Relative: 24 %
Lymphs Abs: 1 10*3/uL (ref 0.7–4.0)
MCH: 34.9 pg — ABNORMAL HIGH (ref 26.0–34.0)
MCHC: 32.9 g/dL (ref 30.0–36.0)
MCV: 105.9 fL — ABNORMAL HIGH (ref 80.0–100.0)
Monocytes Absolute: 0.8 10*3/uL (ref 0.1–1.0)
Monocytes Relative: 20 %
Neutro Abs: 2.1 10*3/uL (ref 1.7–7.7)
Neutrophils Relative %: 53 %
Platelets: 126 10*3/uL — ABNORMAL LOW (ref 150–400)
RBC: 3.07 MIL/uL — ABNORMAL LOW (ref 4.22–5.81)
RDW: 16 % — ABNORMAL HIGH (ref 11.5–15.5)
WBC: 4 10*3/uL (ref 4.0–10.5)
nRBC: 0 % (ref 0.0–0.2)

## 2024-01-04 LAB — COMPREHENSIVE METABOLIC PANEL WITH GFR
ALT: 42 U/L (ref 0–44)
AST: 64 U/L — ABNORMAL HIGH (ref 15–41)
Albumin: 3 g/dL — ABNORMAL LOW (ref 3.5–5.0)
Alkaline Phosphatase: 118 U/L (ref 38–126)
Anion gap: 11 (ref 5–15)
BUN: 6 mg/dL — ABNORMAL LOW (ref 8–23)
CO2: 24 mmol/L (ref 22–32)
Calcium: 10.4 mg/dL — ABNORMAL HIGH (ref 8.9–10.3)
Chloride: 105 mmol/L (ref 98–111)
Creatinine, Ser: 0.7 mg/dL (ref 0.61–1.24)
GFR, Estimated: >60 mL/min (ref >60)
Glucose, Bld: 207 mg/dL — ABNORMAL HIGH (ref 70–99)
Potassium: 3.5 mmol/L (ref 3.5–5.1)
Sodium: 140 mmol/L (ref 135–145)
Total Bilirubin: 0.9 mg/dL (ref 0.0–1.2)
Total Protein: 6.2 g/dL — ABNORMAL LOW (ref 6.5–8.1)

## 2024-01-04 LAB — GLUCOSE, CAPILLARY
Glucose-Capillary: 196 mg/dL — ABNORMAL HIGH (ref 70–99)
Glucose-Capillary: 184 mg/dL — ABNORMAL HIGH (ref 70–99)
Glucose-Capillary: 86 mg/dL (ref 70–99)
Glucose-Capillary: 76 mg/dL (ref 70–99)
Glucose-Capillary: 265 mg/dL — ABNORMAL HIGH (ref 70–99)

## 2024-01-04 LAB — MAGNESIUM: Magnesium: 1.9 mg/dL (ref 1.7–2.4)

## 2024-01-04 LAB — PHOSPHORUS: Phosphorus: 3 mg/dL (ref 2.5–4.6)

## 2024-01-04 MED ORDER — JUVEN PO PACK
1.0000 | PACK | Freq: Two times a day (BID) | ORAL | Status: DC
  Administered 2024-01-04 – 2024-01-11 (×13): 1 via ORAL
  Filled 2024-01-04 (×14): qty 1

## 2024-01-04 MED ORDER — POTASSIUM CHLORIDE CRYS ER 20 MEQ PO TBCR
40.0000 meq | EXTENDED_RELEASE_TABLET | Freq: Once | ORAL | Status: AC
  Administered 2024-01-04: 40 meq via ORAL
  Filled 2024-01-04: qty 2

## 2024-01-04 NOTE — Progress Notes (Signed)
 Nutrition Follow-up  DOCUMENTATION CODES:   Severe malnutrition in context of chronic illness  INTERVENTION:  Glucerna Shake po TID, each supplement provides 220 kcal and 10 grams of protein MVI w/ minerals Liberalize diet to encourage PO intake Collect new weight to assess trend Add 1 packet Juven BID (breakfast and dinner), each packet provides 95 calories, 2.5 grams of protein (collagen), and 9.8 grams of carbohydrate (3 grams sugar); also contains 7 grams of L-arginine and L-glutamine, 300 mg vitamin C, 15 mg vitamin E, 1.2 mcg vitamin B-12, 9.5 mg zinc, 200 mg calcium , and 1.5 g  Calcium  Beta-hydroxy-Beta-methylbutyrate to support wound healing   NUTRITION DIAGNOSIS:  Severe Malnutrition related to chronic illness (NASH cirrhosis, T1DM, HTN) as evidenced by severe muscle depletion, severe fat depletion, percent weight loss. - remains applicable  GOAL:  Patient will meet greater than or equal to 90% of their needs - progressing  MONITOR:  PO intake, Supplement acceptance, Weight trends  REASON FOR ASSESSMENT:  Consult Assessment of nutrition requirement/status  ASSESSMENT:  Pt with PMH significant for NASH cirrhosis, PAF, GERD, T1DM, HTN, arthritis who presented with somnolence/fatigue and was found to have hepatic encephalopathy along with upper GIB. Hospitalization c/b orthostatic hypotension.  4/11 admitted 4/11 CT abd/pelvis: mild circumferential thickening of rectum favoring proctitis 4/13 NGT removed 4/16 EGD: portal hypertensive gastropathy, single AVM-s/p APC, GAVE-s/p APC, 2 bleeding angioectasias in duodenum 4/30 PTH low, vitamin D  wdl  Pt is medically stable for discharge. Somewhat confused this morning. MD wanting to make sure lactulose  given, as this helps with his intermittent confusion. Still attempting bed placement. New skin breakdown noted from 5/2. Stage III pressure ulcer to mid sacrum. Discussed importance of meal and supplement intake to promote wound  healing. Accepting Glucerna offerings TID.   Average Meal Intake 5/2: 0% x2 documented meals 5/4: 25-80% x2 documented meals 5/5: 25% x1 documented meal   Glucerna remains ordered to augment intake. He endorses increased intake, however documentation sporadic. Discussed need for consistently improved intake to promote wound healing and progression with therapies.    Admit Weight: 75.1kg Current Weight: 74.8kg   Wt stable from admission. No edema on exam. Bowels stable.   Drains/Lines: Urinary catheter UOP: 2.4L x24 hours   Calcium  trending up due to inactivity. IVFs administered. PTH low and vitamin D  within desirable range. Insulin  TID increased to 6 units from 4. Semglee  modified from 12 units BID to 30 units in the morning and 4 units in the evening. Monitoring closely to avoid hypoglycemia.    Meds: SSI 0-5 at bedtime, SSI 0-9 TID, SSI 6 TID, Semglee  4 at bedtime, Semglee  30 every morning, lactulose , pantoprazole , mirtazapine , KCl     Labs:   Na+ 140 (wdl) K+ 3.2>3.9>3.3 (L) Ammonia 44>61>45 (H) Ca 10.2>10.0>10.4 (H)  Corr Ca 11.2 (H) PTH 8 (L) CBGs 157-262 x24 hours A1c 4.4 (08/2023)  Diet Order:   Diet Order             Diet regular Room service appropriate? Yes; Fluid consistency: Thin  Diet effective now           Diet - low sodium heart healthy           Diet Carb Modified            EDUCATION NEEDS:   Not appropriate for education at this time  Skin:  Skin Assessment: Skin Integrity Issues: Skin Integrity Issues:: Stage III Stage III: mid sacrum Other: MASD to mid sacrum  Last BM:  5/7 type 6 x1  Height:  Ht Readings from Last 1 Encounters:  12/08/23 5' 7.99" (1.727 m)   Weight:  Wt Readings from Last 1 Encounters:  12/23/23 74.8 kg   Ideal Body Weight:  70 kg  BMI:  Body mass index is 25.08 kg/m.  Estimated Nutritional Needs:   Kcal:  1800-2000kcal  Protein:  95-110g  Fluid:  >1.8L/day  Con Decant MS, RD, LDN Registered  Dietitian Clinical Nutrition RD Inpatient Contact Info in Amion

## 2024-01-04 NOTE — Progress Notes (Signed)
   01/04/24 2000  BiPAP/CPAP/SIPAP  Reason BIPAP/CPAP not in use Non-compliant (pt refused)   Pt refused

## 2024-01-04 NOTE — TOC Progression Note (Signed)
 Transition of Care [facility]) - Progression Note    Patient Details  Name: Tony Cantu MRN: [id number] Date of Birth: 1950/11/21  [facility] CM/SW Contact  Jannice Mends, LCSW Phone Number: 01/04/2024, 3:20 PM  Clinical Narrative:    CSW spoke with [name] to see what her decision is since [place] cannot accept patient. She stated she will call CSW once she arrives home from driving.    Expected Discharge Plan: Skilled Nursing Facility Barriers to Discharge: Continued Medical Work up, English as a second language teacher  Expected Discharge Plan and Services In-house Referral: Clinical Social Work   Post Acute Care Choice: Skilled Nursing Facility Living arrangements for the past 2 months: Single Family Home, Skilled Nursing Facility Expected Discharge Date: 12/21/23                                     Social Determinants of Health (SDOH) Interventions SDOH Screenings   Food Insecurity: Patient Unable To Answer (12/09/2023)  Housing: Patient Unable To Answer (12/09/2023)  Recent Concern: Housing - High Risk (10/30/2023)  Transportation Needs: Patient Unable To Answer (12/09/2023)  Utilities: Patient Unable To Answer (12/09/2023)  Depression (PHQ2-9): Medium Risk (09/13/2023)  Financial Resource Strain: Low Risk  (06/09/2022)  Social Connections: Patient Unable To Answer (12/09/2023)  Recent Concern: Social Connections - Socially Isolated (10/30/2023)  Tobacco Use: Medium Risk (12/08/2023)    Readmission Risk Interventions     No data to display

## 2024-01-04 NOTE — Plan of Care (Signed)

## 2024-01-04 NOTE — Progress Notes (Signed)
 PROGRESS NOTE        PATIENT DETAILS Name: Tony Cantu Age: 73 y.o. Sex: male Date of Birth: Sep 18, 1950 Admit Date: 12/08/2023 Admitting Physician Johnetta Nab, MD ZOX:[id number], Gwendalyn Lemma, DO  Brief Summary: Patient is a 73 y.o.  male with history of [name] cirrhosis, PAF on anticoagulation who presented with somnolence/fatigue-was found to have hepatic encephalopathy along with upper GI bleeding.  Hospital course complicated by orthostatic hypotension and mild hypercalcemia.  Significant events: 4/11>> admit to [facility]  Significant studies: 4/11>> CT abdomen/pelvis: Mild circumferential thickening of the rectum-favoring proctitis. 4/11>> CT head: No acute intracranial abnormality. 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  Consults: GI, nephrology  Subjective: Patient in bed, appears comfortable, denies any headache, no fever, no chest pain or pressure, no shortness of breath , no abdominal pain. No new focal weakness.    Objective: Vitals: Blood pressure 117/66, pulse 69, temperature 98.5 F (36.9 C), temperature source Oral, resp. rate 17, height 5' 7.99" (1.727 m), weight 74.8 kg, SpO2 93%.   Exam:  Awake , mildly confused morning of 01/04/2024, No new F.N deficits,   [place].AT,PERRAL Supple Neck, No JVD,   Symmetrical Chest Boghosian movement, Good air movement bilaterally, CTAB RRR,No Gallops, Rubs or new Murmurs,  +ve B.Sounds, Abd Soft, No tenderness,   No Cyanosis, Clubbing or edema   Assessment/Plan:  Hepatic encephalopathy Likely triggered by upper GI bleeding Resolved with lactulose /Xifaxan  Completely awake/alert this morning Monitor  ammonia intermittently based on his clinical scenario  Upper GI bleeding Secondary to AVMs S/p EGD with APC 4/16 No further bleeding-Hb stable Follow CBC Continue PPI twice daily  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  Chronic HFpEF Euvolemic on exam Diuretics as needed  Hypokalemia Replete/recheck  Mild hypercalcemia Unclear etiology but suspect this is from immobilization-as he has been very reluctant to move for for the past a week or so.  Do not see any culprit medications PTH levels appropriately suppressed, stable vitamin D  levels, pending PTH RP levels Patient is very reluctant to move out of bed, extensively counseled patient and staff, mobility is highly essential. Per glycemia much improved after gentle IV fluids on 01/01/2024, appreciate nephrology input  PAF Telemetry monitoring Amiodarone  Eliquis .  HLD Continue to hold statins-trend LFTs.  Transaminitis Unclear etiology Downtrending Follow-remains on amiodarone -hence watch closely.  COPD/asthma Stable Bronchodilators  OSA Has been refusing CPAP here-but apparently uses at home  Failure to thrive syndrome Poor oral intake Continue PT/OT/nutrition eval Appears somewhat depressed-but per patient this is primarily due to his frustration of remaining in the hospital.  He does not want to talk to a psychiatrist. Continue Remeron  that was started during this hospitalization.  DM-2 (A1c 4.4 on 1/21) CBGs on the higher side Had hypoglycemia earlier-plan is to allow some amount of permissive hyperglycemia given for erratic oral intake CBGs stabilizing-mildly/borderline hypoglycemic this morning Decrease Semglee  to 10 units in p.m. On Semglee  24 units, will adjust further for better control Continue  Premeal NovoLog  6  units Continue SSI Follow/optimize.    Recent Labs    01/03/24 1619 01/03/24 2055 01/04/24 0835  GLUCAP 83 212* 196*     Debility/deconditioning Due to acute illness PT/OT eval-SNF recommended.Social worker following-private pay being explored by family  Code status:   Code Status: Limited: Do not attempt resuscitation (DNR) -DNR-LIMITED -Do Not Intubate/DNI    DVT Prophylaxis: Place TED hose Start: 12/14/23 1312 Place and maintain sequential compression device Start: 12/09/23 0555 SCDs Start: 12/08/23 1707 apixaban  (ELIQUIS ) tablet 5 mg    Family Communication: Ex Wife [name]- [phone]-updated on 5/2  Disposition Plan: Status is: Inpatient Remains inpatient appropriate because: Severity of illness   Planned Discharge Destination:Skilled nursing facility   Diet: Diet Order             Diet regular Room service appropriate? Yes; Fluid consistency: Thin  Diet effective now           Diet - low sodium heart healthy           Diet Carb Modified                     Antimicrobial agents: Anti-infectives (From admission, onward)    Start     Dose/Rate Route Frequency Ordered Stop   12/21/23 0000  rifaximin  (XIFAXAN ) 550 MG TABS tablet        550 mg Oral 2 times daily 12/21/23 0826     12/10/23 2200  rifaximin  (XIFAXAN ) tablet 550 mg        550 mg Oral 2 times daily 12/10/23 1815     12/09/23 1000  rifaximin  (XIFAXAN ) tablet 550 mg  Status:  Discontinued        550 mg Per Tube 2 times daily 12/09/23 0827 12/10/23 1815        MEDICATIONS: Scheduled Meds:  amiodarone   100 mg Oral Daily   apixaban   5 mg Oral BID   atorvastatin   40 mg Oral Daily   feeding supplement (GLUCERNA SHAKE)  237 mL Oral TID BM   insulin  aspart  0-5 Units Subcutaneous QHS   insulin  aspart  0-9 Units Subcutaneous TID WC   insulin  aspart  6 Units Subcutaneous TID WC   insulin  glargine-yfgn  30 Units Subcutaneous q morning   insulin  glargine-yfgn  4 Units  Subcutaneous QHS   lactulose   30 g Oral TID   leptospermum manuka honey  1 Application Topical Daily   midodrine   10 mg Oral TID WC   mirtazapine   15 mg Oral QHS   pantoprazole   40 mg Oral BID AC   potassium chloride   40 mEq Oral Once   rifaximin   550 mg Oral BID   sodium chloride  flush  3 mL Intravenous Q12H   Continuous Infusions:  PRN Meds:.acetaminophen  **OR** acetaminophen , diltiazem , melatonin   I have personally reviewed following labs and imaging studies  LABORATORY DATA:  Recent Labs  Lab 12/31/23 0552 01/01/24 0400 01/02/24 0336 01/03/24 0338 01/04/24 0451  WBC 6.1 6.0 5.3 6.0 4.0  HGB 10.5* 11.3* 10.5* 10.1* 10.7*  HCT 32.3* 34.4* 31.3* 30.9* 32.5*  PLT 158 157 108* 133* 126*  MCV 105.2* 105.5* 103.3* 106.9* 105.9*  MCH 34.2* 34.7* 34.7* 34.9* 34.9*  MCHC 32.5 32.8 33.5 32.7 32.9  RDW 16.2* 16.2* 15.9* 16.1* 16.0*  LYMPHSABS  --  1.2 1.1 1.0 1.0  MONOABS  --  1.1* 0.8 1.0 0.8  EOSABS  --  0.1 0.1 0.0 0.1  BASOSABS  --  0.1 0.0 0.0  0.0    Recent Labs  Lab 12/31/23 0627 01/01/24 0330 01/02/24 0336 01/03/24 0338 01/04/24 0451  NA 144 140 137 137 140  K 3.7 4.1 3.7 4.1 3.5  CL 105 106 103 104 105  CO2 24 24 24 23 24   ANIONGAP 15 10 10 10 11   GLUCOSE 88 159* 225* 290* 207*  BUN 8 8 7* 7* 6*  CREATININE 0.68 0.72 0.72 0.73 0.70  AST 52*  --  64* 63* 64*  ALT 38  --  42 40 42  ALKPHOS 122  --  130* 119 118  BILITOT 1.2  --  1.1 1.0 0.9  ALBUMIN  3.2*  --  3.3* 3.0* 3.0*  TSH 2.066  --   --   --   --   AMMONIA  --  45*  --   --   --   MG 1.7 2.0 1.9 1.8 1.9  PHOS  --   --  2.7 3.2 3.0  CALCIUM  11.1* 10.4* 10.2 10.0 10.4*      Recent Labs  Lab 12/31/23 0627 01/01/24 0330 01/02/24 0336 01/03/24 0338 01/04/24 0451  TSH 2.066  --   --   --   --   AMMONIA  --  45*  --   --   --   MG 1.7 2.0 1.9 1.8 1.9  CALCIUM  11.1* 10.4* 10.2 10.0 10.4*     Lab Results  Component Value Date   HGBA1C 4.4 (L) 09/19/2023     Radiology Reports  No  results found.    Urine analysis:    Component Value Date/Time   COLORURINE YELLOW 12/08/2023 1733   APPEARANCEUR HAZY (A) 12/08/2023 1733   APPEARANCEUR Clear 07/05/2021 1129   LABSPEC 1.019 12/08/2023 1733   PHURINE 7.0 12/08/2023 1733   GLUCOSEU NEGATIVE 12/08/2023 1733   GLUCOSEU NEGATIVE 04/22/2021 1048   HGBUR NEGATIVE 12/08/2023 1733   BILIRUBINUR NEGATIVE 12/08/2023 1733   BILIRUBINUR Negative 07/05/2021 1129   KETONESUR NEGATIVE 12/08/2023 1733   PROTEINUR NEGATIVE 12/08/2023 1733   UROBILINOGEN 0.2 04/22/2021 1048   NITRITE NEGATIVE 12/08/2023 1733   LEUKOCYTESUR NEGATIVE 12/08/2023 1733    Sepsis Labs: Lactic Acid, Venous    Component Value Date/Time   LATICACIDVEN 1.6 12/08/2023 1406    MICROBIOLOGY: No results found for this or any previous visit (from the past 240 hours).  RADIOLOGY STUDIES/RESULTS: No results found.   LOS: 27 days   Signature  -    Lynnwood Sauer M.D on 01/04/2024 at 8:59 AM   -  To page go to [email or web address]

## 2024-01-04 NOTE — Progress Notes (Signed)
 Occupational Therapy Treatment Patient Details Name: Tony Cantu MRN: [id number] DOB: 1951/04/25 Today's Date: 01/04/2024   History of present illness Patient is 73 yo male admitted on 12/08/23 for AMS, nausea, vomiting, and dark stool. PMHx significant for type 1 diabetes, A-fib on Eliquis , seizure disorder, aortic stenosis, HTN, peptic ulcer disease, OA with bilateral knee replacement, polyclonal gammopathy, OSA on CPAP, GERD, COPD/asthma, chronic diastolic CHF, cirrhosis, portal hypertension, and BPH.   OT comments  Pt c/o pain to bottom, fatigued. Pt did not want to complete OOB activity, assisted with positioning in bed, total A for scooting back. Pt requires mod A to assist with feeding, able to take small bites using fork, requires assistance with spoon for soup, assistance for cutting food. Pt only had a few bites of food, stated he did not want anymore. Per notes Pt has not improved with ADLs, transfers, mobility. Per RN Pt has been more lethargic and not tolerating time in recliner. Pt would benefit from continued acute OT to maximize functional strength and OOB activity, DC to postacute rehab <3hrs/day still appropriately.       If plan is discharge home, recommend the following:  Two people to help with walking and/or transfers;A lot of help with bathing/dressing/bathroom;Assistance with cooking/housework;Direct supervision/assist for medications management;Direct supervision/assist for financial management;Assist for transportation;Help with stairs or ramp for entrance;Supervision due to cognitive status   Equipment Recommendations  Other (comment)    Recommendations for Other Services      Precautions / Restrictions Precautions Precautions: Fall Recall of Precautions/Restrictions: Impaired Precaution/Restrictions Comments: watch BP; Frequent loose stools Restrictions Weight Bearing Restrictions Per Provider Order: No       Mobility Bed Mobility Overal bed mobility: Needs  Assistance             General bed mobility comments: refused    Transfers                   General transfer comment: refused     Balance                                           ADL either performed or assessed with clinical judgement   ADL Overall ADL's : Needs assistance/impaired Eating/Feeding: Moderate assistance;Bed level   Grooming: Minimal assistance;Bed level                                 General ADL Comments: Pt too tired to complete OOB activity. Pt required mod A to assist with feeding, difficulty using spoon without spilling soup, able to take small bites of pot roast using fork.    Extremity/Trunk Assessment Upper Extremity Assessment Upper Extremity Assessment: Generalized weakness            Vision       Perception     Praxis     Communication Communication Communication: No apparent difficulties   Cognition Arousal: Alert Behavior During Therapy: WFL for tasks assessed/performed Cognition: No family/caregiver present to determine baseline             OT - Cognition Comments: Pt alert, but fatigued, able to answer questions appropriately, expresses needs                 Following commands: Impaired Following commands impaired: Follows one step commands with increased time, Follows  one step commands inconsistently      Cueing   Cueing Techniques: Verbal cues, Tactile cues, Gestural cues, Visual cues  Exercises      Shoulder Instructions       General Comments      Pertinent Vitals/ Pain       Pain Assessment Pain Assessment: Faces Faces Pain Scale: Hurts a little bit Pain Location: buttocks Pain Descriptors / Indicators: Grimacing, Guarding Pain Intervention(s): Monitored during session, Limited activity within patient's tolerance  Home Living                                          Prior Functioning/Environment              Frequency  Min  1X/week        Progress Toward Goals  OT Goals(current goals can now be found in the care plan section)  Progress towards OT goals: Not progressing toward goals - comment (Pt not progressing, same functional level for last couple of weeks, tolerates chair less and less due to pain in bottom, fatigue. RN notes lethargic more recently.)  Acute Rehab OT Goals Patient Stated Goal: to improve activity tolerance. OT Goal Formulation: With patient Time For Goal Achievement: 01/18/24 Potential to Achieve Goals: Fair ADL Goals Pt Will Perform Grooming: with set-up;sitting Pt Will Perform Upper Body Dressing: with supervision;sitting Pt Will Perform Lower Body Dressing: with mod assist;sit to/from stand;sitting/lateral leans Pt Will Transfer to Toilet: with min assist;stand pivot transfer Pt Will Perform Toileting - Clothing Manipulation and hygiene: with mod assist;sitting/lateral leans;sit to/from stand Additional ADL Goal #1: Pt will complete bed mobility with CGA in preparation for OOB functional tasks  Plan      Co-evaluation                 AM-PAC OT "6 Clicks" Daily Activity     Outcome Measure   Help from another person eating meals?: A Lot Help from another person taking care of personal grooming?: A Little Help from another person toileting, which includes using toliet, bedpan, or urinal?: Total Help from another person bathing (including washing, rinsing, drying)?: A Lot Help from another person to put on and taking off regular upper body clothing?: A Little Help from another person to put on and taking off regular lower body clothing?: Total 6 Click Score: 12    End of Session    OT Visit Diagnosis: Unsteadiness on feet (R26.81);Other abnormalities of gait and mobility (R26.89);Muscle weakness (generalized) (M62.81);Other symptoms and signs involving cognitive function   Activity Tolerance Patient limited by fatigue;Patient limited by pain   Patient Left in  bed;with call bell/phone within reach;with bed alarm set   Nurse Communication Mobility status        Time: 1300-1311 OT Time Calculation (min): 11 min  Charges: OT General Charges $OT Visit: 1 Visit OT Treatments $Self Care/Home Management : 8-22 mins  [place], OTR/L   Scherry Curtis 01/04/2024, 1:17 PM

## 2024-01-05 DIAGNOSIS — K922 Gastrointestinal hemorrhage, unspecified: Secondary | ICD-10-CM | POA: Diagnosis not present

## 2024-01-05 LAB — CBC WITH DIFFERENTIAL/PLATELET
Abs Immature Granulocytes: 0.02 10*3/uL (ref 0.00–0.07)
Basophils Absolute: 0.1 10*3/uL (ref 0.0–0.1)
Basophils Relative: 1 %
Eosinophils Absolute: 0.1 10*3/uL (ref 0.0–0.5)
Eosinophils Relative: 2 %
HCT: 33.5 % — ABNORMAL LOW (ref 39.0–52.0)
Hemoglobin: 11 g/dL — ABNORMAL LOW (ref 13.0–17.0)
Immature Granulocytes: 0 %
Lymphocytes Relative: 27 %
Lymphs Abs: 1.4 10*3/uL (ref 0.7–4.0)
MCH: 35 pg — ABNORMAL HIGH (ref 26.0–34.0)
MCHC: 32.8 g/dL (ref 30.0–36.0)
MCV: 106.7 fL — ABNORMAL HIGH (ref 80.0–100.0)
Monocytes Absolute: 1 10*3/uL (ref 0.1–1.0)
Monocytes Relative: 18 %
Neutro Abs: 2.7 10*3/uL (ref 1.7–7.7)
Neutrophils Relative %: 52 %
Platelets: 125 10*3/uL — ABNORMAL LOW (ref 150–400)
RBC: 3.14 MIL/uL — ABNORMAL LOW (ref 4.22–5.81)
RDW: 16.2 % — ABNORMAL HIGH (ref 11.5–15.5)
WBC: 5.3 10*3/uL (ref 4.0–10.5)
nRBC: 0 % (ref 0.0–0.2)

## 2024-01-05 LAB — COMPREHENSIVE METABOLIC PANEL WITH GFR
ALT: 43 U/L (ref 0–44)
AST: 66 U/L — ABNORMAL HIGH (ref 15–41)
Albumin: 2.8 g/dL — ABNORMAL LOW (ref 3.5–5.0)
Alkaline Phosphatase: 127 U/L — ABNORMAL HIGH (ref 38–126)
Anion gap: 12 (ref 5–15)
BUN: 12 mg/dL (ref 8–23)
CO2: 22 mmol/L (ref 22–32)
Calcium: 10.6 mg/dL — ABNORMAL HIGH (ref 8.9–10.3)
Chloride: 106 mmol/L (ref 98–111)
Creatinine, Ser: 0.76 mg/dL (ref 0.61–1.24)
GFR, Estimated: >60 mL/min (ref >60)
Glucose, Bld: 246 mg/dL — ABNORMAL HIGH (ref 70–99)
Potassium: 3.9 mmol/L (ref 3.5–5.1)
Sodium: 140 mmol/L (ref 135–145)
Total Bilirubin: 0.9 mg/dL (ref 0.0–1.2)
Total Protein: 5.9 g/dL — ABNORMAL LOW (ref 6.5–8.1)

## 2024-01-05 LAB — GLUCOSE, CAPILLARY
Glucose-Capillary: 227 mg/dL — ABNORMAL HIGH (ref 70–99)
Glucose-Capillary: 263 mg/dL — ABNORMAL HIGH (ref 70–99)
Glucose-Capillary: 263 mg/dL — ABNORMAL HIGH (ref 70–99)
Glucose-Capillary: 219 mg/dL — ABNORMAL HIGH (ref 70–99)

## 2024-01-05 LAB — AMMONIA: Ammonia: 56 umol/L — ABNORMAL HIGH (ref 9–35)

## 2024-01-05 MED ORDER — INSULIN GLARGINE-YFGN 100 UNIT/ML ~~LOC~~ SOLN
8.0000 [IU] | Freq: Every day | SUBCUTANEOUS | Status: DC
  Administered 2024-01-05 – 2024-01-10 (×6): 8 [IU] via SUBCUTANEOUS
  Filled 2024-01-05 (×7): qty 0.08

## 2024-01-05 NOTE — Plan of Care (Signed)

## 2024-01-05 NOTE — TOC Progression Note (Addendum)
 Transition of Care [facility]) - Progression Note    Patient Details  Name: Tony Cantu MRN: [id number] Date of Birth: [birth year]  [facility] CM/SW Contact  Jannice Mends, LCSW Phone Number: 01/05/2024, 8:26 AM  Clinical Narrative:    8:26am-CSW spoke with [name]. She has spoken with [facility], [facility], and [address], and [address]. [facility] made her aware that patient would be billed for more than just room and board so [name] is requesting total cost info from the above SNFs. CSW sent inquires and stressed to [name] that a choice needs to be made.   -Per [facility], Room and Board is $396/day for semi, $424/day for private. Therapy is $150/session; She will inquire if there are additional medication costs.   4:37 PM-Per [facility], they are unable to accept patient back after consideration as their therapist does not believe he will benefit from therapies.   [facility]: Approximately $500/day for room and board and therapy. They no longer have a be available but a semi-private may open soon.  [facility]: Awaiting cost info from admissions.   CSW left [name] a voicemail with the above info.  Expected Discharge Plan: Skilled Nursing Facility Barriers to Discharge: Continued Medical Work up, English as a second language teacher  Expected Discharge Plan and Services In-house Referral: Clinical Social Work   Post Acute Care Choice: Skilled Nursing Facility Living arrangements for the past 2 months: Single Family Home, Skilled Nursing Facility Expected Discharge Date: 12/21/23                                     Social Determinants of Health (SDOH) Interventions SDOH Screenings   Food Insecurity: Patient Unable To Answer (12/09/2023)  Housing: Patient Unable To Answer (12/09/2023)  Recent Concern: Housing - High Risk (10/30/2023)  Transportation Needs: Patient Unable To Answer (12/09/2023)  Utilities: Patient Unable To Answer (12/09/2023)  Depression (PHQ2-9): Medium Risk  (09/13/2023)  Financial Resource Strain: Low Risk  (06/09/2022)  Social Connections: Patient Unable To Answer (12/09/2023)  Recent Concern: Social Connections - Socially Isolated (10/30/2023)  Tobacco Use: Medium Risk (12/08/2023)    Readmission Risk Interventions     No data to display

## 2024-01-05 NOTE — Progress Notes (Signed)
 Physical Therapy Treatment Patient Details Name: Tony Cantu MRN: [id number] DOB: 1951/07/20 Today's Date: 01/05/2024   History of Present Illness Patient is 73 yo male admitted on 12/08/23 for AMS, nausea, vomiting, and dark stool. PMHx significant for type 1 diabetes, A-fib on Eliquis , seizure disorder, aortic stenosis, HTN, peptic ulcer disease, OA with bilateral knee replacement, polyclonal gammopathy, OSA on CPAP, GERD, COPD/asthma, chronic diastolic CHF, cirrhosis, portal hypertension, and BPH.    PT Comments  Putting forth good effort, able to perform bed mobility with more independence today. Needs redirected at times. Mod assist +2 for safety to rise from bed with Stedy, min assist to stand from perched position on Stedy paddles. OOB to chair as often as possible. Shows improved midline control and posture while EOB and seated on Stedy. Symptomatic with hypotension during mobility. 95/77 edge of bed. Improved SBP to 120s while sitting in recliner. Patient will continue to benefit from skilled physical therapy services to further improve independence with functional mobility. Patient will benefit from continued inpatient follow up therapy, <3 hours/day    If plan is discharge home, recommend the following: Two people to help with walking and/or transfers;A lot of help with bathing/dressing/bathroom;Assistance with cooking/housework;Direct supervision/assist for medications management;Direct supervision/assist for financial management;Assist for transportation;Supervision due to cognitive status   Can travel by private vehicle     No  Equipment Recommendations  None recommended by PT (TBD)    Recommendations for Other Services       Precautions / Restrictions Precautions Precautions: Fall Recall of Precautions/Restrictions: Impaired Precaution/Restrictions Comments: watch BP; Frequent loose stools Restrictions Weight Bearing Restrictions Per Provider Order: No     Mobility   Bed Mobility Overal bed mobility: Needs Assistance Bed Mobility: Rolling, Sidelying to Sit Rolling: Used rails, Min assist Sidelying to sit: Used rails, Min assist, HOB elevated       General bed mobility comments: Min assist with heavy cues to facilitate and bring LEs to edge of bed. Gave up initially and brought LEs back into bed with able to redirect and complete task. Pushing up better from left sidelying, still needs up to min assist to complete. More midline today once seated EOB.    Transfers Overall transfer level: Needs assistance Equipment used: Rolling walker (2 wheels) Transfers: Sit to/from Stand Sit to Stand: +2 safety/equipment, Mod assist, From elevated surface, Via lift equipment           General transfer comment: Utilized the steady today to help facilitate improved inititation and independence with rising and standing. Mod assist for boost +2 for safety. Able to rise from [facility] in perched position with min assist. This helped with forward weight shift significantly.  Additional practice limited by symptomatic hypotension. Transfer via Lift Equipment: Stedy  Ambulation/Gait                   Stairs             Wheelchair Mobility     Tilt Bed    Modified Rankin (Stroke Patients Only)       Balance Overall balance assessment: Needs assistance Sitting-balance support: No upper extremity supported, Feet supported Sitting balance-Leahy Scale: Fair Sitting balance - Comments: Sat edge of bed CGA   Standing balance support: Bilateral upper extremity supported, Reliant on assistive device for balance Standing balance-Leahy Scale: Poor Standing balance comment: Steady  Communication Communication Communication: No apparent difficulties  Cognition Arousal: Alert Behavior During Therapy: WFL for tasks assessed/performed   PT - Cognitive impairments: No family/caregiver present to determine baseline,  Attention, Sequencing, Problem solving, Safety/Judgement, Awareness, Memory, Initiation                       PT - Cognition Comments: Still with difficulty sequencing and initiating, but seems improved today. Following commands: Impaired Following commands impaired: Follows one step commands with increased time, Follows one step commands inconsistently    Cueing Cueing Techniques: Verbal cues, Tactile cues, Gestural cues, Visual cues  Exercises Other Exercises Other Exercises: Seated balance working midline control, much improved today.    General Comments General comments (skin integrity, edema, etc.): Supine BP 127/68 HR 77; Seated BP 95/77 HR 93. SBP 70s perched on Stedy.      Pertinent Vitals/Pain Pain Assessment Pain Assessment: No/denies pain    Home Living                          Prior Function            PT Goals (current goals can now be found in the care plan section) Acute Rehab PT Goals Patient Stated Goal: Go to rehab in [place] PT Goal Formulation: Patient unable to participate in goal setting Time For Goal Achievement: 01/19/24 Potential to Achieve Goals: Fair Progress towards PT goals: Progressing toward goals    Frequency    Min 1X/week      PT Plan      Co-evaluation              AM-PAC PT "6 Clicks" Mobility   Outcome Measure  Help needed turning from your back to your side while in a flat bed without using bedrails?: A Little Help needed moving from lying on your back to sitting on the side of a flat bed without using bedrails?: A Little Help needed moving to and from a bed to a chair (including a wheelchair)?: Total Help needed standing up from a chair using your arms (e.g., wheelchair or bedside chair)?: Total Help needed to walk in hospital room?: Total Help needed climbing 3-5 steps with a railing? : Total 6 Click Score: 10    End of Session Equipment Utilized During Treatment: Gait belt Activity Tolerance:  Patient tolerated treatment well Patient left: with call bell/phone within reach;in chair;with chair alarm set Nurse Communication: Mobility status;Need for lift equipment (Recommended Stedy) PT Visit Diagnosis: Muscle weakness (generalized) (M62.81);Difficulty in walking, not elsewhere classified (R26.2);Other symptoms and signs involving the nervous system (R29.898);Dizziness and giddiness (R42);Unsteadiness on feet (R26.81)     Time: 0981-1914 PT Time Calculation (min) (ACUTE ONLY): 27 min  Charges:    $Therapeutic Activity: 23-37 mins PT General Charges $$ ACUTE PT VISIT: 1 Visit                     Jory Ng, PT, DPT [facility]  Rehabilitation Services Physical Therapist Office: [phone] Website: [email or web address]    Alinda Irani 01/05/2024, 10:45 AM

## 2024-01-05 NOTE — Progress Notes (Signed)
 CBGs in 200s. Ok to increase HS semglee  to 8 units per Dr. Zelda Hickman.  Ivery Marking, PharmD, BCIDP, AAHIVP, CPP Infectious Disease Pharmacist 01/05/2024 10:38 AM

## 2024-01-05 NOTE — Progress Notes (Signed)
 PROGRESS NOTE        PATIENT DETAILS Name: Tony Cantu Age: 73 y.o. Sex: male Date of Birth: 26-Jan-1951 Admit Date: 12/08/2023 Admitting Physician Johnetta Nab, MD ONG:[id number], Gwendalyn Lemma, DO  Brief Summary: Patient is a 73 y.o.  male with history of [name] cirrhosis, PAF on anticoagulation who presented with somnolence/fatigue-was found to have hepatic encephalopathy along with upper GI bleeding.  Hospital course complicated by orthostatic hypotension and mild hypercalcemia.  Significant events: 4/11>> admit to [facility]  Significant studies: 4/11>> CT abdomen/pelvis: Mild circumferential thickening of the rectum-favoring proctitis. 4/11>> CT head: No acute intracranial abnormality. 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  Consults: GI, nephrology  Subjective: Patient in bed, appears comfortable, denies any headache, no fever, no chest pain or pressure, no shortness of breath , no abdominal pain. No new focal weakness.  Objective: Vitals: Blood pressure 109/69, pulse 76, temperature 98.1 F (36.7 C), temperature source Oral, resp. rate 20, height 5' 7.99" (1.727 m), weight 74.8 kg, SpO2 98%.   Exam:  Awake , mildly confused morning of 01/04/2024, No new F.N deficits,   [place].AT,PERRAL Supple Neck, No JVD,   Symmetrical Chest Shatzer movement, Good air movement bilaterally, CTAB RRR,No Gallops, Rubs or new Murmurs,  +ve B.Sounds, Abd Soft, No tenderness,   No Cyanosis, Clubbing or edema   Assessment/Plan:  Hepatic encephalopathy Likely triggered by upper GI bleeding Resolved with lactulose /Xifaxan  Completely awake/alert this morning Monitor ammonia  intermittently based on his clinical scenario  Upper GI bleeding Secondary to AVMs S/p EGD with APC 4/16 No further bleeding-Hb stable Follow CBC Continue PPI twice daily  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  Chronic HFpEF Euvolemic on exam Diuretics as needed  Hypokalemia Replete/recheck  Mild hypercalcemia Unclear etiology but suspect this is from immobilization-as he has been very reluctant to move for for the past a week or so.  Do not see any culprit medications PTH levels appropriately suppressed, stable vitamin D  levels, pending PTH RP levels Patient is very reluctant to move out of bed, extensively counseled patient and staff, mobility is highly essential. Per glycemia much improved after gentle IV fluids on 01/01/2024, appreciate nephrology input  PAF Telemetry monitoring Amiodarone  Eliquis .  HLD Continue to hold statins-trend LFTs.  Transaminitis Unclear etiology Downtrending Follow-remains on amiodarone -hence watch closely.  COPD/asthma Stable Bronchodilators  [facility] Has been refusing CPAP here-but apparently uses at home  Failure to thrive syndrome Poor oral intake Continue PT/OT/nutrition eval Appears somewhat depressed-but per patient this is primarily due to his frustration of remaining in the hospital.  He does not want to talk to a psychiatrist. Continue Remeron  that was started during this hospitalization.  DM-2 (A1c 4.4 on 1/21) CBGs on the higher side Had hypoglycemia earlier-plan is to allow some amount of permissive hyperglycemia given for erratic oral intake CBGs stabilizing-mildly/borderline hypoglycemic this morning Decrease Semglee  to 10 units in p.m. On Semglee  24 units, will adjust further for better control Continue Premeal  NovoLog  6 units Continue  SSI Follow/optimize.    Recent Labs    01/04/24 1649 01/04/24 2030 01/05/24 0744  GLUCAP 76 265* 227*     Debility/deconditioning Due to acute illness PT/OT eval-SNF recommended.Social worker following-private pay being explored by family  Code status:   Code Status: Limited: Do not attempt resuscitation (DNR) -DNR-LIMITED -Do Not Intubate/DNI    DVT Prophylaxis: Place TED hose Start: 12/14/23 1312 Place and maintain sequential compression device Start: 12/09/23 0555 SCDs Start: 12/08/23 1707 apixaban  (ELIQUIS ) tablet 5 mg    Family Communication: Ex Wife [name]- [phone]-updated on 5/2  Disposition Plan: Status is: Inpatient Remains inpatient appropriate because: Severity of illness   Planned Discharge Destination:Skilled nursing facility   Diet: Diet Order             Diet regular Room service appropriate? Yes; Fluid consistency: Thin  Diet effective now           Diet - low sodium heart healthy           Diet Carb Modified                     Antimicrobial agents: Anti-infectives (From admission, onward)    Start     Dose/Rate Route Frequency Ordered Stop   12/21/23 0000  rifaximin  (XIFAXAN ) 550 MG TABS tablet        550 mg Oral 2 times daily 12/21/23 0826     12/10/23 2200  rifaximin  (XIFAXAN ) tablet 550 mg        550 mg Oral 2 times daily 12/10/23 1815     12/09/23 1000  rifaximin  (XIFAXAN ) tablet 550 mg  Status:  Discontinued        550 mg Per Tube 2 times daily 12/09/23 0827 12/10/23 1815        MEDICATIONS: Scheduled Meds:  amiodarone   100 mg Oral Daily   apixaban   5 mg Oral BID   atorvastatin   40 mg Oral Daily   feeding supplement (GLUCERNA SHAKE)  237 mL Oral TID BM   insulin  aspart  0-5 Units Subcutaneous QHS   insulin  aspart  0-9 Units Subcutaneous TID WC   insulin  aspart  6 Units Subcutaneous TID WC   insulin  glargine-yfgn  30 Units Subcutaneous q morning   insulin  glargine-yfgn  4 Units  Subcutaneous QHS   lactulose   30 g Oral TID   leptospermum manuka honey  1 Application Topical Daily   midodrine   10 mg Oral TID WC   mirtazapine   15 mg Oral QHS   nutrition supplement (JUVEN)  1 packet Oral BID AC & HS   pantoprazole   40 mg Oral BID AC   rifaximin   550 mg Oral BID   sodium chloride  flush  3 mL Intravenous Q12H   Continuous Infusions:  PRN Meds:.acetaminophen  **OR** acetaminophen , diltiazem , melatonin   I have personally reviewed following labs and imaging studies  LABORATORY DATA:  Recent Labs  Lab 01/01/24 0400 01/02/24 0336 01/03/24 0338 01/04/24 0451 01/05/24 0606  WBC 6.0 5.3 6.0 4.0 5.3  HGB 11.3* 10.5* 10.1* 10.7* 11.0*  HCT 34.4* 31.3* 30.9* 32.5* 33.5*  PLT 157 108* 133* 126* 125*  MCV 105.5* 103.3* 106.9* 105.9* 106.7*  MCH 34.7* 34.7* 34.9* 34.9* 35.0*  MCHC 32.8 33.5 32.7 32.9 32.8  RDW 16.2* 15.9* 16.1* 16.0* 16.2*  LYMPHSABS 1.2 1.1 1.0 1.0 1.4  MONOABS 1.1* 0.8 1.0 0.8 1.0  EOSABS 0.1 0.1 0.0 0.1 0.1  BASOSABS 0.1 0.0 0.0 0.0 0.1    Recent Labs  Lab 12/31/23 0627 01/01/24 0330 01/02/24 0336 01/03/24 0338 01/04/24 0451 01/05/24 0606  NA 144 140 137 137 140 140  K 3.7 4.1 3.7 4.1 3.5 3.9  CL 105 106 103 104 105 106  CO2 24 24 24 23 24 22   ANIONGAP 15 10 10 10 11 12   GLUCOSE 88 159* 225* 290* 207* 246*  BUN 8 8 7* 7* 6* 12  CREATININE 0.68 0.72 0.72 0.73 0.70 0.76  AST 52*  --  64* 63* 64* 66*  ALT 38  --  42 40 42 43  ALKPHOS 122  --  130* 119 118 127*  BILITOT 1.2  --  1.1 1.0 0.9 0.9  ALBUMIN  3.2*  --  3.3* 3.0* 3.0* 2.8*  TSH 2.066  --   --   --   --   --   AMMONIA  --  45*  --   --   --  56*  MG 1.7 2.0 1.9 1.8 1.9  --   PHOS  --   --  2.7 3.2 3.0  --   CALCIUM  11.1* 10.4* 10.2 10.0 10.4* 10.6*      Recent Labs  Lab 12/31/23 0627 01/01/24 0330 01/02/24 0336 01/03/24 0338 01/04/24 0451 01/05/24 0606  TSH 2.066  --   --   --   --   --   AMMONIA  --  45*  --   --   --  56*  MG 1.7 2.0 1.9 1.8 1.9  --    CALCIUM  11.1* 10.4* 10.2 10.0 10.4* 10.6*     Lab Results  Component Value Date   HGBA1C 4.4 (L) 09/19/2023     Radiology Reports  No results found.    Urine analysis:    Component Value Date/Time   COLORURINE YELLOW 12/08/2023 1733   APPEARANCEUR HAZY (A) 12/08/2023 1733   APPEARANCEUR Clear 07/05/2021 1129   LABSPEC 1.019 12/08/2023 1733   PHURINE 7.0 12/08/2023 1733   GLUCOSEU NEGATIVE 12/08/2023 1733   GLUCOSEU NEGATIVE 04/22/2021 1048   HGBUR NEGATIVE 12/08/2023 1733   BILIRUBINUR NEGATIVE 12/08/2023 1733   BILIRUBINUR Negative 07/05/2021 1129   KETONESUR NEGATIVE 12/08/2023 1733   PROTEINUR NEGATIVE 12/08/2023 1733   UROBILINOGEN 0.2 04/22/2021 1048   NITRITE NEGATIVE 12/08/2023 1733   LEUKOCYTESUR NEGATIVE 12/08/2023 1733    Sepsis Labs: Lactic Acid, Venous    Component Value Date/Time   LATICACIDVEN 1.6 12/08/2023 1406    MICROBIOLOGY: No results found for this or any previous visit (from the past 240 hours).  RADIOLOGY STUDIES/RESULTS: No results found.   LOS: 28 days   Signature  -    Lynnwood Sauer M.D on 01/05/2024 at 8:50 AM   -  To page go to [email or web address]

## 2024-01-05 NOTE — Consult Note (Signed)
 WOC Nurse wound follow up  WTA made 3 attempts to follow-up/ view HAPI, patient in chair, unable/difficult to stand/mobilize.  Bedside RN made aware and request for photo made.    Gillermo Lack, RN, MSN, [facility] WOC Team

## 2024-01-05 NOTE — Progress Notes (Signed)
   01/05/24 2156  BiPAP/CPAP/SIPAP  BiPAP/CPAP/SIPAP Pt Type Adult  Reason BIPAP/CPAP not in use Non-compliant  BiPAP/CPAP /SiPAP Vitals  SpO2 100 %  Bilateral Breath Sounds Clear

## 2024-01-05 NOTE — Progress Notes (Signed)
   01/05/24 1344  Mobility  Activity Transferred from chair to bed  Level of Assistance Maximum assist, patient does 25-49% (+3)  Assistive Device Stedy  Activity Response Tolerated fair  Mobility Referral Yes  Mobility visit 1 Mobility  Mobility Specialist Start Time (ACUTE ONLY) 1344  Mobility Specialist Stop Time (ACUTE ONLY) 1409  Mobility Specialist Time Calculation (min) (ACUTE ONLY) 25 min   Mobility Specialist: Progress Note  Pre-Mobility:      HR 78, SpO2 94% RA, BP 132/81 (95) Post-Mobility:    HR 93, SpO2 89% RA, BP 124/71 (86)  Pt agreeable to mobility session - received in chair. C/o BLE weakness, dizziness throughout. Pt unable to stand fully upright, x2 attempts STS from chair, x2 attempts STS from stedy. Pt bleeding at L knee - RN aware and present. Returned to bed with all needs met - call bell within reach. RN present in room.  Tony Cantu, BS Mobility Specialist Please contact via SecureChat or  Rehab office at [phone].

## 2024-01-06 DIAGNOSIS — K922 Gastrointestinal hemorrhage, unspecified: Secondary | ICD-10-CM | POA: Diagnosis not present

## 2024-01-06 LAB — COMPREHENSIVE METABOLIC PANEL WITH GFR
ALT: 40 U/L (ref 0–44)
AST: 60 U/L — ABNORMAL HIGH (ref 15–41)
Albumin: 2.9 g/dL — ABNORMAL LOW (ref 3.5–5.0)
Alkaline Phosphatase: 128 U/L — ABNORMAL HIGH (ref 38–126)
Anion gap: 10 (ref 5–15)
BUN: 13 mg/dL (ref 8–23)
CO2: 25 mmol/L (ref 22–32)
Calcium: 10.6 mg/dL — ABNORMAL HIGH (ref 8.9–10.3)
Chloride: 106 mmol/L (ref 98–111)
Creatinine, Ser: 0.81 mg/dL (ref 0.61–1.24)
GFR, Estimated: >60 mL/min (ref >60)
Glucose, Bld: 189 mg/dL — ABNORMAL HIGH (ref 70–99)
Potassium: 3.7 mmol/L (ref 3.5–5.1)
Sodium: 141 mmol/L (ref 135–145)
Total Bilirubin: 0.9 mg/dL (ref 0.0–1.2)
Total Protein: 6.1 g/dL — ABNORMAL LOW (ref 6.5–8.1)

## 2024-01-06 LAB — CBC WITH DIFFERENTIAL/PLATELET
Abs Immature Granulocytes: 0.02 10*3/uL (ref 0.00–0.07)
Basophils Absolute: 0.1 10*3/uL (ref 0.0–0.1)
Basophils Relative: 1 %
Eosinophils Absolute: 0.1 10*3/uL (ref 0.0–0.5)
Eosinophils Relative: 2 %
HCT: 32.4 % — ABNORMAL LOW (ref 39.0–52.0)
Hemoglobin: 10.6 g/dL — ABNORMAL LOW (ref 13.0–17.0)
Immature Granulocytes: 0 %
Lymphocytes Relative: 24 %
Lymphs Abs: 1.6 10*3/uL (ref 0.7–4.0)
MCH: 35.7 pg — ABNORMAL HIGH (ref 26.0–34.0)
MCHC: 32.7 g/dL (ref 30.0–36.0)
MCV: 109.1 fL — ABNORMAL HIGH (ref 80.0–100.0)
Monocytes Absolute: 1.2 10*3/uL — ABNORMAL HIGH (ref 0.1–1.0)
Monocytes Relative: 17 %
Neutro Abs: 3.9 10*3/uL (ref 1.7–7.7)
Neutrophils Relative %: 56 %
Platelets: 115 10*3/uL — ABNORMAL LOW (ref 150–400)
RBC: 2.97 MIL/uL — ABNORMAL LOW (ref 4.22–5.81)
RDW: 16 % — ABNORMAL HIGH (ref 11.5–15.5)
WBC: 6.8 10*3/uL (ref 4.0–10.5)
nRBC: 0 % (ref 0.0–0.2)

## 2024-01-06 LAB — GLUCOSE, CAPILLARY
Glucose-Capillary: 182 mg/dL — ABNORMAL HIGH (ref 70–99)
Glucose-Capillary: 186 mg/dL — ABNORMAL HIGH (ref 70–99)
Glucose-Capillary: 212 mg/dL — ABNORMAL HIGH (ref 70–99)
Glucose-Capillary: 104 mg/dL — ABNORMAL HIGH (ref 70–99)

## 2024-01-06 LAB — AMMONIA: Ammonia: 61 umol/L — ABNORMAL HIGH (ref 9–35)

## 2024-01-06 NOTE — Plan of Care (Signed)
?  Problem: Clinical Measurements: ?Goal: Diagnostic test results will improve ?Outcome: Progressing ?  ?Problem: Nutrition: ?Goal: Adequate nutrition will be maintained ?Outcome: Progressing ?  ?Problem: Coping: ?Goal: Level of anxiety will decrease ?Outcome: Progressing ?  ?Problem: Elimination: ?Goal: Will not experience complications related to bowel motility ?Outcome: Progressing ?Goal: Will not experience complications related to urinary retention ?Outcome: Progressing ?  ?Problem: Safety: ?Goal: Ability to remain free from injury will improve ?Outcome: Progressing ?  ?Problem: Skin Integrity: ?Goal: Risk for impaired skin integrity will decrease ?Outcome: Progressing ?  ?

## 2024-01-06 NOTE — Progress Notes (Signed)
 PROGRESS NOTE        PATIENT DETAILS Name: Tony Cantu Age: 73 y.o. Sex: male Date of Birth: 19-Jun-1951 Admit Date: 12/08/2023 Admitting Physician Johnetta Nab, MD [id number], Gwendalyn Lemma, DO  Brief Summary: Patient is a 73 y.o.  male with history of [name] cirrhosis, PAF on anticoagulation who presented with somnolence/fatigue-was found to have hepatic encephalopathy along with upper GI bleeding.  Hospital course complicated by orthostatic hypotension and mild hypercalcemia.  Significant events: 4/11>> admit to [facility]  Significant studies: 4/11>> CT abdomen/pelvis: Mild circumferential thickening of the rectum-favoring proctitis. 4/11>> CT head: No acute intracranial abnormality. 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  Consults: GI, nephrology  Subjective: Patient in bed, appears comfortable, denies any headache, no fever, no chest pain or pressure, no shortness of breath , no abdominal pain. No new focal weakness.  Objective: Vitals: Blood pressure 128/72, pulse 66, temperature (!) 97.5 F (36.4 C), temperature source Oral, resp. rate 18, height 5' 7.99" (1.727 m), weight 78.3 kg, SpO2 98%.   Exam:  Awake , mildly confused morning of 01/04/2024, No new F.N deficits,   [place].AT,PERRAL Supple Neck, No JVD,   Symmetrical Chest [place] movement, Good air movement bilaterally, CTAB RRR,No Gallops, Rubs or new Murmurs,  +ve B.Sounds, Abd Soft, No tenderness,   No Cyanosis, Clubbing or edema   Assessment/Plan:  Hepatic encephalopathy Likely triggered by upper GI bleeding Resolved with lactulose /Xifaxan  Completely awake/alert this morning Monitor  ammonia intermittently based on his clinical scenario  Upper GI bleeding Secondary to AVMs S/p EGD with APC 4/16 No further bleeding-Hb stable Follow CBC Continue PPI twice daily  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  Chronic HFpEF Euvolemic on exam Diuretics as needed  Hypokalemia Replete/recheck  Mild hypercalcemia Unclear etiology but suspect this is from immobilization-as he has been very reluctant to move for for the past a week or so.  Do not see any culprit medications PTH levels appropriately suppressed, stable vitamin D  levels, pending PTH RP levels Patient is very reluctant to move out of bed, extensively counseled patient and staff, mobility is highly essential. Per glycemia much improved after gentle IV fluids on 01/01/2024, appreciate nephrology input  PAF Telemetry monitoring Amiodarone  Eliquis .  HLD Continue to hold statins-trend LFTs.  Transaminitis Unclear etiology Downtrending Follow-remains on amiodarone -hence watch closely.  COPD/asthma Stable Bronchodilators  [facility] Has been refusing CPAP here-but apparently uses at home  Failure to thrive syndrome Poor oral intake Continue PT/OT/nutrition eval Appears somewhat depressed-but per patient this is primarily due to his frustration of remaining in the hospital.  He does not want to talk to a psychiatrist. Continue Remeron  that was started during this hospitalization.  DM-2 (A1c 4.4 on 1/21) CBGs on the higher side Had hypoglycemia earlier-plan is to allow some amount of permissive hyperglycemia given for erratic oral intake CBGs stabilizing-mildly/borderline hypoglycemic this morning Decrease Semglee  to 10 units in p.m. On Semglee  24 units, will adjust further for better control Continue  Premeal NovoLog  6 units  Continue SSI Follow/optimize.    Recent Labs    01/05/24 1608 01/05/24 2041 01/06/24 0737  GLUCAP 263* 219* 182*     Debility/deconditioning Due to acute illness PT/OT eval-SNF recommended.Social worker following-private pay being explored by family  Code status:   Code Status: Limited: Do not attempt resuscitation (DNR) -DNR-LIMITED -Do Not Intubate/DNI    DVT Prophylaxis: Place TED hose Start: 12/14/23 1312 Place and maintain sequential compression device Start: 12/09/23 0555 SCDs Start: 12/08/23 1707 apixaban  (ELIQUIS ) tablet 5 mg    Family Communication: Ex Wife [name]- [phone]-updated on 5/2  Disposition Plan: Status is: Inpatient Remains inpatient appropriate because: Severity of illness   Planned Discharge Destination:Skilled nursing facility   Diet: Diet Order             Diet regular Room service appropriate? Yes; Fluid consistency: Thin  Diet effective now           Diet - low sodium heart healthy           Diet Carb Modified                     Antimicrobial agents: Anti-infectives (From admission, onward)    Start     Dose/Rate Route Frequency Ordered Stop   12/21/23 0000  rifaximin  (XIFAXAN ) 550 MG TABS tablet        550 mg Oral 2 times daily 12/21/23 0826     12/10/23 2200  rifaximin  (XIFAXAN ) tablet 550 mg        550 mg Oral 2 times daily 12/10/23 1815     12/09/23 1000  rifaximin  (XIFAXAN ) tablet 550 mg  Status:  Discontinued        550 mg Per Tube 2 times daily 12/09/23 0827 12/10/23 1815        MEDICATIONS: Scheduled Meds:  amiodarone   100 mg Oral Daily   apixaban   5 mg Oral BID   atorvastatin   40 mg Oral Daily   feeding supplement (GLUCERNA SHAKE)  237 mL Oral TID BM   insulin  aspart  0-5 Units Subcutaneous QHS   insulin  aspart  0-9 Units Subcutaneous TID WC   insulin  aspart  6 Units Subcutaneous TID WC   insulin  glargine-yfgn  30 Units Subcutaneous q morning   insulin  glargine-yfgn  8 Units  Subcutaneous QHS   lactulose   30 g Oral TID   leptospermum manuka honey  1 Application Topical Daily   midodrine   10 mg Oral TID WC   mirtazapine   15 mg Oral QHS   nutrition supplement (JUVEN)  1 packet Oral BID AC & HS   pantoprazole   40 mg Oral BID AC   rifaximin   550 mg Oral BID   sodium chloride  flush  3 mL Intravenous Q12H   Continuous Infusions:  PRN Meds:.acetaminophen  **OR** acetaminophen , diltiazem , melatonin   I have personally reviewed following labs and imaging studies  LABORATORY DATA:  Recent Labs  Lab 01/02/24 0336 01/03/24 0338 01/04/24 0451 01/05/24 0606 01/06/24 0559  WBC 5.3 6.0 4.0 5.3 6.8  HGB 10.5* 10.1* 10.7* 11.0* 10.6*  HCT 31.3* 30.9* 32.5* 33.5* 32.4*  PLT 108* 133* 126* 125* 115*  MCV 103.3* 106.9* 105.9* 106.7* 109.1*  MCH 34.7* 34.9* 34.9* 35.0* 35.7*  MCHC 33.5 32.7 32.9 32.8 32.7  RDW 15.9* 16.1* 16.0* 16.2* 16.0*  LYMPHSABS 1.1 1.0 1.0 1.4 1.6  MONOABS 0.8 1.0 0.8 1.0 1.2*  EOSABS 0.1 0.0 0.1 0.1 0.1  BASOSABS 0.0 0.0 0.0 0.1 0.1    Recent  Labs  Lab 12/31/23 0627 01/01/24 0330 01/02/24 0336 01/03/24 0338 01/04/24 0451 01/05/24 0606 01/06/24 0559  NA 144 140 137 137 140 140 141  K 3.7 4.1 3.7 4.1 3.5 3.9 3.7  CL 105 106 103 104 105 106 106  CO2 24 24 24 23 24 22 25   ANIONGAP 15 10 10 10 11 12 10   GLUCOSE 88 159* 225* 290* 207* 246* 189*  BUN 8 8 7* 7* 6* 12 13  CREATININE 0.68 0.72 0.72 0.73 0.70 0.76 0.81  AST 52*  --  64* 63* 64* 66* 60*  ALT 38  --  42 40 42 43 40  ALKPHOS 122  --  130* 119 118 127* 128*  BILITOT 1.2  --  1.1 1.0 0.9 0.9 0.9  ALBUMIN  3.2*  --  3.3* 3.0* 3.0* 2.8* 2.9*  TSH 2.066  --   --   --   --   --   --   AMMONIA  --  45*  --   --   --  56* 61*  MG 1.7 2.0 1.9 1.8 1.9  --   --   PHOS  --   --  2.7 3.2 3.0  --   --   CALCIUM  11.1* 10.4* 10.2 10.0 10.4* 10.6* 10.6*      Recent Labs  Lab 12/31/23 0627 01/01/24 0330 01/02/24 0336 01/03/24 0338 01/04/24 0451 01/05/24 0606 01/06/24 0559   TSH 2.066  --   --   --   --   --   --   AMMONIA  --  45*  --   --   --  56* 61*  MG 1.7 2.0 1.9 1.8 1.9  --   --   CALCIUM  11.1* 10.4* 10.2 10.0 10.4* 10.6* 10.6*     Lab Results  Component Value Date   HGBA1C 4.4 (L) 09/19/2023     Radiology Reports  No results found.    Urine analysis:    Component Value Date/Time   COLORURINE YELLOW 12/08/2023 1733   APPEARANCEUR HAZY (A) 12/08/2023 1733   APPEARANCEUR Clear 07/05/2021 1129   LABSPEC 1.019 12/08/2023 1733   PHURINE 7.0 12/08/2023 1733   GLUCOSEU NEGATIVE 12/08/2023 1733   GLUCOSEU NEGATIVE 04/22/2021 1048   HGBUR NEGATIVE 12/08/2023 1733   BILIRUBINUR NEGATIVE 12/08/2023 1733   BILIRUBINUR Negative 07/05/2021 1129   KETONESUR NEGATIVE 12/08/2023 1733   PROTEINUR NEGATIVE 12/08/2023 1733   UROBILINOGEN 0.2 04/22/2021 1048   NITRITE NEGATIVE 12/08/2023 1733   LEUKOCYTESUR NEGATIVE 12/08/2023 1733    Sepsis Labs: Lactic Acid, Venous    Component Value Date/Time   LATICACIDVEN 1.6 12/08/2023 1406    MICROBIOLOGY: No results found for this or any previous visit (from the past 240 hours).  RADIOLOGY STUDIES/RESULTS: No results found.   LOS: 29 days   Signature  -    Lynnwood Sauer M.D on 01/06/2024 at 10:03 AM   -  To page go to [email or web address]

## 2024-01-06 NOTE — Plan of Care (Signed)
   Problem: Education: Goal: Knowledge of General Education information will improve Description Including pain rating scale, medication(s)/side effects and non-pharmacologic comfort measures Outcome: Progressing

## 2024-01-07 DIAGNOSIS — K922 Gastrointestinal hemorrhage, unspecified: Secondary | ICD-10-CM | POA: Diagnosis not present

## 2024-01-07 LAB — GLUCOSE, CAPILLARY
Glucose-Capillary: 55 mg/dL — ABNORMAL LOW (ref 70–99)
Glucose-Capillary: 89 mg/dL (ref 70–99)
Glucose-Capillary: 288 mg/dL — ABNORMAL HIGH (ref 70–99)
Glucose-Capillary: 325 mg/dL — ABNORMAL HIGH (ref 70–99)
Glucose-Capillary: 126 mg/dL — ABNORMAL HIGH (ref 70–99)

## 2024-01-07 LAB — COMPREHENSIVE METABOLIC PANEL WITH GFR
ALT: 41 U/L (ref 0–44)
AST: 58 U/L — ABNORMAL HIGH (ref 15–41)
Albumin: 2.9 g/dL — ABNORMAL LOW (ref 3.5–5.0)
Alkaline Phosphatase: 126 U/L (ref 38–126)
Anion gap: 8 (ref 5–15)
BUN: 11 mg/dL (ref 8–23)
CO2: 27 mmol/L (ref 22–32)
Calcium: 10.4 mg/dL — ABNORMAL HIGH (ref 8.9–10.3)
Chloride: 108 mmol/L (ref 98–111)
Creatinine, Ser: 0.82 mg/dL (ref 0.61–1.24)
GFR, Estimated: >60 mL/min (ref >60)
Glucose, Bld: 51 mg/dL — ABNORMAL LOW (ref 70–99)
Potassium: 3.4 mmol/L — ABNORMAL LOW (ref 3.5–5.1)
Sodium: 143 mmol/L (ref 135–145)
Total Bilirubin: 0.9 mg/dL (ref 0.0–1.2)
Total Protein: 6.2 g/dL — ABNORMAL LOW (ref 6.5–8.1)

## 2024-01-07 LAB — CBC WITH DIFFERENTIAL/PLATELET
Abs Immature Granulocytes: 0.03 10*3/uL (ref 0.00–0.07)
Basophils Absolute: 0.1 10*3/uL (ref 0.0–0.1)
Basophils Relative: 1 %
Eosinophils Absolute: 0.1 10*3/uL (ref 0.0–0.5)
Eosinophils Relative: 1 %
HCT: 33.2 % — ABNORMAL LOW (ref 39.0–52.0)
Hemoglobin: 10.8 g/dL — ABNORMAL LOW (ref 13.0–17.0)
Immature Granulocytes: 0 %
Lymphocytes Relative: 13 %
Lymphs Abs: 1.1 10*3/uL (ref 0.7–4.0)
MCH: 35 pg — ABNORMAL HIGH (ref 26.0–34.0)
MCHC: 32.5 g/dL (ref 30.0–36.0)
MCV: 107.4 fL — ABNORMAL HIGH (ref 80.0–100.0)
Monocytes Absolute: 1.2 10*3/uL — ABNORMAL HIGH (ref 0.1–1.0)
Monocytes Relative: 14 %
Neutro Abs: 6.2 10*3/uL (ref 1.7–7.7)
Neutrophils Relative %: 71 %
Platelets: 129 10*3/uL — ABNORMAL LOW (ref 150–400)
RBC: 3.09 MIL/uL — ABNORMAL LOW (ref 4.22–5.81)
RDW: 15.9 % — ABNORMAL HIGH (ref 11.5–15.5)
WBC: 8.6 10*3/uL (ref 4.0–10.5)
nRBC: 0 % (ref 0.0–0.2)

## 2024-01-07 MED ORDER — INSULIN ASPART 100 UNIT/ML IJ SOLN
3.0000 [IU] | Freq: Three times a day (TID) | INTRAMUSCULAR | Status: DC
  Administered 2024-01-07 – 2024-01-11 (×13): 3 [IU] via SUBCUTANEOUS

## 2024-01-07 MED ORDER — INSULIN GLARGINE-YFGN 100 UNIT/ML ~~LOC~~ SOLN
25.0000 [IU] | Freq: Every morning | SUBCUTANEOUS | Status: DC
  Administered 2024-01-07 – 2024-01-11 (×5): 25 [IU] via SUBCUTANEOUS
  Filled 2024-01-07 (×5): qty 0.25

## 2024-01-07 NOTE — Plan of Care (Signed)
  Problem: Clinical Measurements: Goal: Will remain free from infection Outcome: Progressing Goal: Diagnostic test results will improve Outcome: Progressing   Problem: Activity: Goal: Risk for activity intolerance will decrease Outcome: Progressing   Problem: Coping: Goal: Level of anxiety will decrease Outcome: Progressing   Problem: Elimination: Goal: Will not experience complications related to bowel motility Outcome: Progressing   Problem: Safety: Goal: Ability to remain free from injury will improve Outcome: Progressing   Problem: Metabolic: Goal: Ability to maintain appropriate glucose levels will improve Outcome: Progressing

## 2024-01-07 NOTE — Plan of Care (Signed)
  Problem: Safety: Goal: Ability to remain free from injury will improve Outcome: Progressing   Problem: Skin Integrity: Goal: Risk for impaired skin integrity will decrease Outcome: Progressing   Problem: Education: Goal: Ability to describe self-care measures that may prevent or decrease complications (Diabetes Survival Skills Education) will improve Outcome: Progressing

## 2024-01-07 NOTE — Progress Notes (Addendum)
 PROGRESS NOTE        PATIENT DETAILS Name: Tony Cantu Age: 73 y.o. Sex: male Date of Birth: 01/20/1951 Admit Date: 12/08/2023 Admitting Physician Johnetta Nab, MD [id number], Gwendalyn Lemma, DO  Brief Summary: Patient is a 73 y.o.  male with history of [name] cirrhosis, PAF on anticoagulation who presented with somnolence/fatigue-was found to have hepatic encephalopathy along with upper GI bleeding.  Hospital course complicated by orthostatic hypotension and mild hypercalcemia.  Significant events: 4/11>> admit to [facility]  Significant studies: 4/11>> CT abdomen/pelvis: Mild circumferential thickening of the rectum-favoring proctitis. 4/11>> CT head: No acute intracranial abnormality. 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  Consults: GI, nephrology  Subjective: Patient in bed, appears comfortable, denies any headache, no fever, no chest pain or pressure, no shortness of breath , no abdominal pain. No new focal weakness.  Objective: Vitals: Blood pressure (!) 123/90, pulse 83, temperature 97.8 F (36.6 C), temperature source Oral, resp. rate 20, height 5' 7.99" (1.727 m), weight 78.3 kg, SpO2 97%.   Exam:  Awake , mildly confused at times in the morning, No new F.N deficits,   [place].AT,PERRAL Supple Neck, No JVD,   Symmetrical Chest Farro movement, Good air movement bilaterally, CTAB RRR,No Gallops, Rubs or new Murmurs,  +ve B.Sounds, Abd Soft, No tenderness,   No Cyanosis, Clubbing or edema   Assessment/Plan:  Hepatic encephalopathy Likely triggered by upper GI bleeding Resolved with lactulose /Xifaxan  Completely awake/alert this morning Monitor  ammonia intermittently based on his clinical scenario  Upper GI bleeding Secondary to AVMs S/p EGD with APC 4/16 No further bleeding-Hb stable Follow CBC Continue PPI twice daily  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  Chronic HFpEF Euvolemic on exam Diuretics as needed  Hypokalemia Replete/recheck  Mild hypercalcemia Unclear etiology but suspect this is from immobilization-as he has been very reluctant to move for for the past a week or so.  Do not see any culprit medications PTH levels appropriately suppressed, stable vitamin D  levels, pending PTH RP levels Patient is very reluctant to move out of bed, extensively counseled patient and staff, mobility is highly essential. Per glycemia much improved after gentle IV fluids on 01/01/2024, appreciate nephrology input  PAF Telemetry monitoring Amiodarone  Eliquis .  HLD Continue to hold statins-trend LFTs.  Transaminitis Unclear etiology Downtrending Follow-remains on amiodarone -hence watch closely.  COPD/asthma Stable Bronchodilators  OSA Has been refusing CPAP here-but apparently uses at home  Failure to thrive syndrome Poor oral intake Continue PT/OT/nutrition eval Appears somewhat depressed-but per patient this is primarily due to his frustration of remaining in the hospital.  He does not want to talk to a psychiatrist. Continue Remeron  that was started during this hospitalization.  DM-2 (A1c 4.4 on 1/21) CBGs on the higher side Currently on combination of long-acting insulin , Premeal NovoLog  along with sliding scale dose adjusted on 01/07/2024 for better control.   Recent Labs    01/06/24 2112 01/07/24 0735 01/07/24 0818  GLUCAP 104* 55* 89     Debility/deconditioning  Due to acute illness PT/OT  eval-SNF recommended.Social worker following-private pay being explored by family  Code status:   Code Status: Limited: Do not attempt resuscitation (DNR) -DNR-LIMITED -Do Not Intubate/DNI    DVT Prophylaxis: Place TED hose Start: 12/14/23 1312 Place and maintain sequential compression device Start: 12/09/23 0555 SCDs Start: 12/08/23 1707 apixaban  (ELIQUIS ) tablet 5 mg    Family Communication: Ex Wife [name]- [phone]-updated on 5/2  Disposition Plan: Status is: Inpatient Remains inpatient appropriate because: Severity of illness   Planned Discharge Destination:Skilled nursing facility   Diet: Diet Order             Diet regular Room service appropriate? Yes; Fluid consistency: Thin  Diet effective now           Diet - low sodium heart healthy           Diet Carb Modified                     Antimicrobial agents: Anti-infectives (From admission, onward)    Start     Dose/Rate Route Frequency Ordered Stop   12/21/23 0000  rifaximin  (XIFAXAN ) 550 MG TABS tablet        550 mg Oral 2 times daily 12/21/23 0826     12/10/23 2200  rifaximin  (XIFAXAN ) tablet 550 mg        550 mg Oral 2 times daily 12/10/23 1815     12/09/23 1000  rifaximin  (XIFAXAN ) tablet 550 mg  Status:  Discontinued        550 mg Per Tube 2 times daily 12/09/23 0827 12/10/23 1815        MEDICATIONS: Scheduled Meds:  amiodarone   100 mg Oral Daily   apixaban   5 mg Oral BID   atorvastatin   40 mg Oral Daily   feeding supplement (GLUCERNA SHAKE)  237 mL Oral TID BM   insulin  aspart  0-9 Units Subcutaneous TID WC   insulin  aspart  3 Units Subcutaneous TID WC   insulin  glargine-yfgn  25 Units Subcutaneous q morning   insulin  glargine-yfgn  8 Units Subcutaneous QHS   lactulose   30 g Oral TID   leptospermum manuka honey  1 Application Topical Daily   midodrine   10 mg Oral TID WC   mirtazapine   15 mg Oral QHS   nutrition supplement (JUVEN)  1 packet Oral BID AC & HS   pantoprazole   40 mg Oral  BID AC   rifaximin   550 mg Oral BID   sodium chloride  flush  3 mL Intravenous Q12H   Continuous Infusions:  PRN Meds:.acetaminophen  **OR** acetaminophen , diltiazem , melatonin   I have personally reviewed following labs and imaging studies  LABORATORY DATA:  Recent Labs  Lab 01/03/24 0338 01/04/24 0451 01/05/24 0606 01/06/24 0559 01/07/24 0547  WBC 6.0 4.0 5.3 6.8 8.6  HGB 10.1* 10.7* 11.0* 10.6* 10.8*  HCT 30.9* 32.5* 33.5* 32.4* 33.2*  PLT 133* 126* 125* 115* 129*  MCV 106.9* 105.9* 106.7* 109.1* 107.4*  MCH 34.9* 34.9* 35.0* 35.7* 35.0*  MCHC 32.7 32.9 32.8 32.7 32.5  RDW 16.1* 16.0* 16.2* 16.0* 15.9*  LYMPHSABS 1.0 1.0 1.4 1.6 1.1  MONOABS 1.0 0.8 1.0 1.2* 1.2*  EOSABS 0.0 0.1 0.1 0.1 0.1  BASOSABS 0.0 0.0 0.1 0.1 0.1    Recent Labs  Lab 01/01/24 0330 01/01/24 0330 01/02/24 0336 01/03/24 0338 01/04/24 0451 01/05/24 0606 01/06/24 0559 01/07/24 0547  NA 140  --  137 137 140 140 141 143  K 4.1  --  3.7 4.1 3.5 3.9 3.7 3.4*  CL 106  --  103 104 105 106 106 108  CO2 24  --  24 23 24 22 25 27   ANIONGAP 10  --  10 10 11 12 10 8   GLUCOSE 159*  --  225* 290* 207* 246* 189* 51*  BUN 8  --  7* 7* 6* 12 13 11   CREATININE 0.72  --  0.72 0.73 0.70 0.76 0.81 0.82  AST  --    < > 64* 63* 64* 66* 60* 58*  ALT  --    < > 42 40 42 43 40 41  ALKPHOS  --    < > 130* 119 118 127* 128* 126  BILITOT  --    < > 1.1 1.0 0.9 0.9 0.9 0.9  ALBUMIN   --    < > 3.3* 3.0* 3.0* 2.8* 2.9* 2.9*  AMMONIA 45*  --   --   --   --  56* 61*  --   MG 2.0  --  1.9 1.8 1.9  --   --   --   PHOS  --   --  2.7 3.2 3.0  --   --   --   CALCIUM  10.4*  --  10.2 10.0 10.4* 10.6* 10.6* 10.4*   < > = values in this interval not displayed.      Recent Labs  Lab 01/01/24 0330 01/02/24 0336 01/03/24 0338 01/04/24 0451 01/05/24 0606 01/06/24 0559 01/07/24 0547  AMMONIA 45*  --   --   --  56* 61*  --   MG 2.0 1.9 1.8 1.9  --   --   --   CALCIUM  10.4* 10.2 10.0 10.4* 10.6* 10.6* 10.4*     Lab  Results  Component Value Date   HGBA1C 4.4 (L) 09/19/2023     Radiology Reports  No results found.    Urine analysis:    Component Value Date/Time   COLORURINE YELLOW 12/08/2023 1733   APPEARANCEUR HAZY (A) 12/08/2023 1733   APPEARANCEUR Clear 07/05/2021 1129   LABSPEC 1.019 12/08/2023 1733   PHURINE 7.0 12/08/2023 1733   GLUCOSEU NEGATIVE 12/08/2023 1733   GLUCOSEU NEGATIVE 04/22/2021 1048   HGBUR NEGATIVE 12/08/2023 1733   BILIRUBINUR NEGATIVE 12/08/2023 1733   BILIRUBINUR Negative 07/05/2021 1129   KETONESUR NEGATIVE 12/08/2023 1733   PROTEINUR NEGATIVE 12/08/2023 1733   UROBILINOGEN 0.2 04/22/2021 1048   NITRITE NEGATIVE 12/08/2023 1733   LEUKOCYTESUR NEGATIVE 12/08/2023 1733    Sepsis Labs: Lactic Acid, Venous    Component Value Date/Time   LATICACIDVEN 1.6 12/08/2023 1406    MICROBIOLOGY: No results found for this or any previous visit (from the past 240 hours).  RADIOLOGY STUDIES/RESULTS: No results found.   LOS: 30 days   Signature  -    Lynnwood Sauer M.D on 01/07/2024 at 9:40 AM   -  To page go to [email or web address]

## 2024-01-08 DIAGNOSIS — K922 Gastrointestinal hemorrhage, unspecified: Secondary | ICD-10-CM | POA: Diagnosis not present

## 2024-01-08 LAB — CBC WITH DIFFERENTIAL/PLATELET
Abs Immature Granulocytes: 0.02 10*3/uL (ref 0.00–0.07)
Basophils Absolute: 0 10*3/uL (ref 0.0–0.1)
Basophils Relative: 1 %
Eosinophils Absolute: 0.1 10*3/uL (ref 0.0–0.5)
Eosinophils Relative: 1 %
HCT: 30.9 % — ABNORMAL LOW (ref 39.0–52.0)
Hemoglobin: 10.3 g/dL — ABNORMAL LOW (ref 13.0–17.0)
Immature Granulocytes: 0 %
Lymphocytes Relative: 22 %
Lymphs Abs: 1.2 10*3/uL (ref 0.7–4.0)
MCH: 35.3 pg — ABNORMAL HIGH (ref 26.0–34.0)
MCHC: 33.3 g/dL (ref 30.0–36.0)
MCV: 105.8 fL — ABNORMAL HIGH (ref 80.0–100.0)
Monocytes Absolute: 0.8 10*3/uL (ref 0.1–1.0)
Monocytes Relative: 16 %
Neutro Abs: 3.1 10*3/uL (ref 1.7–7.7)
Neutrophils Relative %: 60 %
Platelets: 110 10*3/uL — ABNORMAL LOW (ref 150–400)
RBC: 2.92 MIL/uL — ABNORMAL LOW (ref 4.22–5.81)
RDW: 15.7 % — ABNORMAL HIGH (ref 11.5–15.5)
WBC: 5.2 10*3/uL (ref 4.0–10.5)
nRBC: 0 % (ref 0.0–0.2)

## 2024-01-08 LAB — COMPREHENSIVE METABOLIC PANEL WITH GFR
ALT: 44 U/L (ref 0–44)
AST: 65 U/L — ABNORMAL HIGH (ref 15–41)
Albumin: 2.9 g/dL — ABNORMAL LOW (ref 3.5–5.0)
Alkaline Phosphatase: 131 U/L — ABNORMAL HIGH (ref 38–126)
Anion gap: 10 (ref 5–15)
BUN: 14 mg/dL (ref 8–23)
CO2: 24 mmol/L (ref 22–32)
Calcium: 10.3 mg/dL (ref 8.9–10.3)
Chloride: 104 mmol/L (ref 98–111)
Creatinine, Ser: 0.68 mg/dL (ref 0.61–1.24)
GFR, Estimated: >60 mL/min (ref >60)
Glucose, Bld: 148 mg/dL — ABNORMAL HIGH (ref 70–99)
Potassium: 3.3 mmol/L — ABNORMAL LOW (ref 3.5–5.1)
Sodium: 138 mmol/L (ref 135–145)
Total Bilirubin: 1 mg/dL (ref 0.0–1.2)
Total Protein: 6.2 g/dL — ABNORMAL LOW (ref 6.5–8.1)

## 2024-01-08 LAB — GLUCOSE, CAPILLARY
Glucose-Capillary: 150 mg/dL — ABNORMAL HIGH (ref 70–99)
Glucose-Capillary: 246 mg/dL — ABNORMAL HIGH (ref 70–99)
Glucose-Capillary: 308 mg/dL — ABNORMAL HIGH (ref 70–99)
Glucose-Capillary: 170 mg/dL — ABNORMAL HIGH (ref 70–99)

## 2024-01-08 LAB — PTH-RELATED PEPTIDE: PTH-related peptide: <2 pmol/L

## 2024-01-08 MED ORDER — MAGNESIUM HYDROXIDE 400 MG/5ML PO SUSP
30.0000 mL | Freq: Two times a day (BID) | ORAL | Status: AC
  Administered 2024-01-08 (×2): 30 mL via ORAL
  Filled 2024-01-08 (×2): qty 30

## 2024-01-08 MED ORDER — POTASSIUM CHLORIDE CRYS ER 20 MEQ PO TBCR
40.0000 meq | EXTENDED_RELEASE_TABLET | Freq: Once | ORAL | Status: AC
Start: 2024-01-08 — End: 2024-01-08
  Administered 2024-01-08: 40 meq via ORAL
  Filled 2024-01-08: qty 2

## 2024-01-08 NOTE — Inpatient Diabetes Management (Signed)
 Inpatient Diabetes Program Recommendations  AACE/ADA: New Consensus Statement on Inpatient Glycemic Control (2015)  Target Ranges:  Prepandial:   less than 140 mg/dL      Peak postprandial:   less than 180 mg/dL (1-2 hours)      Critically ill patients:  140 - 180 mg/dL   Lab Results  Component Value Date   GLUCAP 246 (H) 01/08/2024   HGBA1C 4.4 (L) 09/19/2023    Review of Glycemic Control  Latest Reference Range & Units 01/07/24 08:18 01/07/24 11:19 01/07/24 15:28 01/07/24 21:27 01/08/24 08:22 01/08/24 11:40  Glucose-Capillary 70 - 99 mg/dL 89 638 (H) 756 (H) 433 (H) 150 (H) 246 (H)  (H): Data is abnormally high  Inpatient Diabetes Program Recommendations:    Might consider increasing meal coverage insulin .    Will continue to follow while inpatient.  Thank you, Hays Lipschutz, MSN, CDCES Diabetes Coordinator Inpatient Diabetes Program [phone] (team pager from 8a-5p)

## 2024-01-08 NOTE — Consult Note (Signed)
 WOC team previously consulted for MASD, see consult 4/16   WOC Nurse Followup Note: Reason for Consult: Stage 3 Pressure Injury Sacrum  Wound type: Stage 3 Pressure Injury Sacrum  Pressure Injury POA: no Measurement: see nursing flowsheet  Wound bed: 30% tan slough 70% red  Drainage (amount, consistency, odor) see nursing flowsheet   Periwound:intact  Dressing procedure/placement/frequency: Cleanse sacral wound with Vashe wound cleanser Timm Foot [phone]), do not rinse and allow to air dry. Apply Medihoney to wound bed daily, cover with dry gauze and silicone foam.     Reviewed remotely via picture in chart, wound appearance is improving, continue current treatment plan with no changes. WOC team will follow weekly to assess area and change POC as needed.    Thank you  Gillermo Lack, RN, MSN, [facility] WOC Team

## 2024-01-08 NOTE — Progress Notes (Signed)
 Physical Therapy Treatment Patient Details Name: Tony Cantu MRN: [id number] DOB: [birth year] Today's Date: 01/08/2024   History of Present Illness Patient is 73 yo male admitted on 12/08/23 for AMS, nausea, vomiting, and dark stool. PMHx significant for type 1 diabetes, A-fib on Eliquis , seizure disorder, aortic stenosis, HTN, peptic ulcer disease, OA with bilateral knee replacement, polyclonal gammopathy, OSA on CPAP, GERD, COPD/asthma, chronic diastolic CHF, cirrhosis, portal hypertension, and BPH.    PT Comments  Having some increased difficulty today transitioning to EOB, and sitting midline. Demonstrates pushing tendency towards left, some mild Rt neglect, and tone in LEs worse while upright and hypotensive. MD notified. Worked on seated balance, rightward lean and WB through Rt elbow with forward gaze and target finding towards Rt. Max assist +2 for step pivot transfer with some tone in LEs, difficulty abduction LLE in standing, but can volitionally move while sitting. Reviewed LE exercises. Patient will continue to benefit from skilled physical therapy services to further improve independence with functional mobility.     01/08/24 1300  Orthostatic Lying   BP- Lying 114/82  Pulse- Lying 80  Orthostatic Sitting  BP- Sitting (!) 78/59  Pulse- Sitting 114  Orthostatic Standing at 0 minutes  BP- Standing at 0 minutes (!) 81/62  Pulse- Standing at 0 minutes 102      If plan is discharge home, recommend the following: Two people to help with walking and/or transfers;A lot of help with bathing/dressing/bathroom;Assistance with cooking/housework;Direct supervision/assist for medications management;Direct supervision/assist for financial management;Assist for transportation;Supervision due to cognitive status   Can travel by private vehicle     No  Equipment Recommendations  None recommended by PT (TBD)    Recommendations for Other Services       Precautions / Restrictions  Precautions Precautions: Fall Recall of Precautions/Restrictions: Impaired Precaution/Restrictions Comments: watch BP; Frequent loose stools Restrictions Weight Bearing Restrictions Per Provider Order: No     Mobility  Bed Mobility Overal bed mobility: Needs Assistance Bed Mobility: Rolling, Sidelying to Sit Rolling: Used rails, Min assist Sidelying to sit: Used rails, HOB elevated, Mod assist       General bed mobility comments: Min assist to roll with cues and assist to reach for rail with RUE.  Assisted with peri-care in this position, RN applied sacral pad. Mod assist to rise to edge of bed. pushing towards left against help initially.    Transfers Overall transfer level: Needs assistance Equipment used: Rolling walker (2 wheels) Transfers: Sit to/from Stand Sit to Stand: +2 safety/equipment, Mod assist, From elevated surface, Via lift equipment   Step pivot transfers: Max assist, +2 safety/equipment, +2 physical assistance       General transfer comment: Mod assist +2 for boost and balance to stand initially with RW but pushing away despite multimodal cues. Difficulty standing fully upright, attempted 3 times, followed by a 4th requiring Max A +2 for step pivot transfer towards left. Not advancing LLE, needs assist to abduction and step.    Ambulation/Gait                   Stairs             Wheelchair Mobility     Tilt Bed    Modified Rankin (Stroke Patients Only)       Balance Overall balance assessment: Needs assistance Sitting-balance support: Feet supported, Single extremity supported Sitting balance-Leahy Scale: Poor Sitting balance - Comments: Sat edge of bed min assist Postural control: Left lateral lean Standing balance  support: Bilateral upper extremity supported, Reliant on assistive device for balance Standing balance-Leahy Scale: Poor                              Communication Communication Communication: No  apparent difficulties  Cognition Arousal: Alert Behavior During Therapy: WFL for tasks assessed/performed   PT - Cognitive impairments: No family/caregiver present to determine baseline, Attention, Sequencing, Problem solving, Safety/Judgement, Awareness, Memory, Initiation                         Following commands: Impaired Following commands impaired: Follows one step commands with increased time, Follows one step commands inconsistently    Cueing Cueing Techniques: Verbal cues, Tactile cues, Gestural cues, Visual cues  Exercises General Exercises - Lower Extremity Ankle Circles/Pumps: AROM, Both, Seated, 15 reps Quad Sets: Strengthening, Both, 10 reps, Supine Heel Slides: Strengthening, Both, 10 reps, Seated Hip ABduction/ADduction: Strengthening, Both, 10 reps, Seated Other Exercises Other Exercises: Seated balance working midline control, Rt lean onto elbow, mod assist due to resisting (pusher syndrome?) Other Exercises: BLE knee extension sitting EOB    General Comments General comments (skin integrity, edema, etc.): Demonstrates pushing tendency towards left, some mild Rt neglect, and tone in LEs worse while upright and hypotensive. MD notified.      Pertinent Vitals/Pain Pain Assessment Pain Assessment: Faces Faces Pain Scale: Hurts little more Pain Location: buttocks Pain Descriptors / Indicators: Grimacing, Guarding Pain Intervention(s): Monitored during session, Repositioned    Home Living                          Prior Function            PT Goals (current goals can now be found in the care plan section) Acute Rehab PT Goals Patient Stated Goal: Go to rehab in [place] PT Goal Formulation: Patient unable to participate in goal setting Time For Goal Achievement: 01/19/24 Potential to Achieve Goals: Fair Progress towards PT goals: Progressing toward goals    Frequency    Min 1X/week      PT Plan      Co-evaluation               AM-PAC PT "6 Clicks" Mobility   Outcome Measure  Help needed turning from your back to your side while in a flat bed without using bedrails?: A Little Help needed moving from lying on your back to sitting on the side of a flat bed without using bedrails?: A Lot Help needed moving to and from a bed to a chair (including a wheelchair)?: Total Help needed standing up from a chair using your arms (e.g., wheelchair or bedside chair)?: Total Help needed to walk in hospital room?: Total Help needed climbing 3-5 steps with a railing? : Total 6 Click Score: 9    End of Session Equipment Utilized During Treatment: Gait belt Activity Tolerance: Patient tolerated treatment well Patient left: with call bell/phone within reach;in chair;with chair alarm set Nurse Communication: Mobility status;Need for lift equipment (Recommended Stedy) PT Visit Diagnosis: Muscle weakness (generalized) (M62.81);Difficulty in walking, not elsewhere classified (R26.2);Other symptoms and signs involving the nervous system (R29.898);Dizziness and giddiness (R42);Unsteadiness on feet (R26.81) Pain - part of body:  (buttocks)     Time: 1610-9604 PT Time Calculation (min) (ACUTE ONLY): 30 min  Charges:    $Therapeutic Activity: 23-37 mins PT General Charges $$ ACUTE PT  VISIT: 1 Visit                     Jory Ng, PT, DPT [facility]  Rehabilitation Services Physical Therapist Office: [phone] Website: [email or web address]    Alinda Irani 01/08/2024, 1:50 PM

## 2024-01-08 NOTE — Plan of Care (Signed)

## 2024-01-08 NOTE — Plan of Care (Signed)
 Problem: Education: Goal: Knowledge of General Education information will improve Description: Including pain rating scale, medication(s)/side effects and non-pharmacologic comfort measures 01/08/2024 0210 by Durand Gift, RN Outcome: Progressing 01/08/2024 0209 by Durand Gift, RN Outcome: Progressing   Problem: Health Behavior/Discharge Planning: Goal: Ability to manage health-related needs will improve 01/08/2024 0210 by Durand Gift, RN Outcome: Progressing 01/08/2024 0209 by Durand Gift, RN Outcome: Progressing   Problem: Clinical Measurements: Goal: Ability to maintain clinical measurements within normal limits will improve 01/08/2024 0210 by Durand Gift, RN Outcome: Progressing 01/08/2024 0209 by Durand Gift, RN Outcome: Progressing Goal: Will remain free from infection 01/08/2024 0210 by Durand Gift, RN Outcome: Progressing 01/08/2024 0209 by Durand Gift, RN Outcome: Progressing Goal: Diagnostic test results will improve 01/08/2024 0210 by Durand Gift, RN Outcome: Progressing 01/08/2024 0209 by Durand Gift, RN Outcome: Progressing Goal: Respiratory complications will improve 01/08/2024 0210 by Durand Gift, RN Outcome: Progressing 01/08/2024 0209 by Durand Gift, RN Outcome: Progressing Goal: Cardiovascular complication will be avoided 01/08/2024 0210 by Durand Gift, RN Outcome: Progressing 01/08/2024 0209 by Durand Gift, RN Outcome: Progressing   Problem: Activity: Goal: Risk for activity intolerance will decrease 01/08/2024 0210 by Durand Gift, RN Outcome: Progressing 01/08/2024 0209 by Durand Gift, RN Outcome: Progressing   Problem: Nutrition: Goal: Adequate nutrition will be maintained 01/08/2024 0210 by Durand Gift, RN Outcome: Progressing 01/08/2024 0209 by Durand Gift, RN Outcome: Progressing   Problem:  Coping: Goal: Level of anxiety will decrease 01/08/2024 0210 by Durand Gift, RN Outcome: Progressing 01/08/2024 0209 by Durand Gift, RN Outcome: Progressing   Problem: Elimination: Goal: Will not experience complications related to bowel motility 01/08/2024 0210 by Durand Gift, RN Outcome: Progressing 01/08/2024 0209 by Durand Gift, RN Outcome: Progressing Goal: Will not experience complications related to urinary retention 01/08/2024 0210 by Durand Gift, RN Outcome: Progressing 01/08/2024 0209 by Durand Gift, RN Outcome: Progressing   Problem: Pain Managment: Goal: General experience of comfort will improve and/or be controlled 01/08/2024 0210 by Durand Gift, RN Outcome: Progressing 01/08/2024 0209 by Durand Gift, RN Outcome: Progressing   Problem: Safety: Goal: Ability to remain free from injury will improve 01/08/2024 0210 by Durand Gift, RN Outcome: Progressing 01/08/2024 0209 by Durand Gift, RN Outcome: Progressing   Problem: Skin Integrity: Goal: Risk for impaired skin integrity will decrease 01/08/2024 0210 by Durand Gift, RN Outcome: Progressing 01/08/2024 0209 by Durand Gift, RN Outcome: Progressing   Problem: Education: Goal: Ability to describe self-care measures that may prevent or decrease complications (Diabetes Survival Skills Education) will improve 01/08/2024 0210 by Durand Gift, RN Outcome: Progressing 01/08/2024 0209 by Durand Gift, RN Outcome: Progressing Goal: Individualized Educational Video(s) 01/08/2024 0210 by Durand Gift, RN Outcome: Progressing 01/08/2024 0209 by Durand Gift, RN Outcome: Progressing   Problem: Coping: Goal: Ability to adjust to condition or change in health will improve 01/08/2024 0210 by Durand Gift, RN Outcome: Progressing 01/08/2024 0209 by Durand Gift, RN Outcome:  Progressing   Problem: Fluid Volume: Goal: Ability to maintain a balanced intake and output will improve 01/08/2024 0210 by Durand Gift, RN Outcome: Progressing 01/08/2024 0209 by Durand Gift, RN Outcome: Progressing   Problem: Health Behavior/Discharge Planning: Goal: Ability to identify and utilize available resources and services will improve 01/08/2024 0210 by Durand Gift, RN Outcome: Progressing 01/08/2024 0209 by  Durand Gift, RN Outcome: Progressing Goal: Ability to manage health-related needs will improve 01/08/2024 0210 by Durand Gift, RN Outcome: Progressing 01/08/2024 0209 by Durand Gift, RN Outcome: Progressing   Problem: Metabolic: Goal: Ability to maintain appropriate glucose levels will improve 01/08/2024 0210 by Durand Gift, RN Outcome: Progressing 01/08/2024 0209 by Durand Gift, RN Outcome: Progressing   Problem: Nutritional: Goal: Maintenance of adequate nutrition will improve 01/08/2024 0210 by Durand Gift, RN Outcome: Progressing 01/08/2024 0209 by Durand Gift, RN Outcome: Progressing Goal: Progress toward achieving an optimal weight will improve 01/08/2024 0210 by Durand Gift, RN Outcome: Progressing 01/08/2024 0209 by Durand Gift, RN Outcome: Progressing   Problem: Skin Integrity: Goal: Risk for impaired skin integrity will decrease 01/08/2024 0210 by Durand Gift, RN Outcome: Progressing 01/08/2024 0209 by Durand Gift, RN Outcome: Progressing   Problem: Tissue Perfusion: Goal: Adequacy of tissue perfusion will improve 01/08/2024 0210 by Durand Gift, RN Outcome: Progressing 01/08/2024 0209 by Durand Gift, RN Outcome: Progressing

## 2024-01-08 NOTE — Progress Notes (Signed)
 PROGRESS NOTE        PATIENT DETAILS Name: Tony Cantu Age: 73 y.o. Sex: male Date of Birth: 07/18/1951 Admit Date: 12/08/2023 Admitting Physician Johnetta Nab, MD ZOX:[id number], Gwendalyn Lemma, DO  Brief Summary: Patient is a 73 y.o.  male with history of [name] cirrhosis, PAF on anticoagulation who presented with somnolence/fatigue-was found to have hepatic encephalopathy along with upper GI bleeding.  Hospital course complicated by orthostatic hypotension and mild hypercalcemia.  Significant events: 4/11>> admit to [facility]  Significant studies: 4/11>> CT abdomen/pelvis: Mild circumferential thickening of the rectum-favoring proctitis. 4/11>> CT head: No acute intracranial abnormality. 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  Consults: GI, nephrology  Subjective: Patient in bed, appears comfortable, denies any headache, no fever, no chest pain or pressure, no shortness of breath , no abdominal pain. No new focal weakness.  Objective: Vitals: Blood pressure 130/84, pulse 79, temperature 97.9 F (36.6 C), temperature source Oral, resp. rate (!) 21, height 5' 7.99" (1.727 m), weight 78.3 kg, SpO2 94%.   Exam:  Awake , mildly confused at times in the morning, No new F.N deficits,   [place].AT,PERRAL Supple Neck, No JVD,   Symmetrical Chest Guerreiro movement, Good air movement bilaterally, CTAB RRR,No Gallops, Rubs or new Murmurs,  +ve B.Sounds, Abd Soft, No tenderness,   No Cyanosis, Clubbing or edema   Assessment/Plan:  Hepatic encephalopathy Likely triggered by upper GI bleeding Resolved with lactulose /Xifaxan  Completely awake/alert this morning Monitor  ammonia intermittently based on his clinical scenario  Upper GI bleeding Secondary to AVMs S/p EGD with APC 4/16 No further bleeding-Hb stable Follow CBC Continue PPI twice daily  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  Chronic HFpEF Euvolemic on exam Diuretics as needed  Hypokalemia Replete/recheck  Mild hypercalcemia Unclear etiology but suspect this is from immobilization-as he has been very reluctant to move for for the past a week or so.  Do not see any culprit medications PTH levels appropriately suppressed, stable vitamin D  levels, pending PTH RP levels Patient is very reluctant to move out of bed, extensively counseled patient and staff, mobility is highly essential. Per glycemia much improved after gentle IV fluids on 01/01/2024, appreciate nephrology input  PAF Telemetry monitoring Amiodarone  Eliquis .  HLD Continue to hold statins-trend LFTs.  Transaminitis Unclear etiology Downtrending Follow-remains on amiodarone -hence watch closely.  COPD/asthma Stable Bronchodilators  OSA Has been refusing CPAP here-but apparently uses at home  Failure to thrive syndrome Poor oral intake Continue PT/OT/nutrition eval Appears somewhat depressed-but per patient this is primarily due to his frustration of remaining in the hospital.  He does not want to talk to a psychiatrist. Continue Remeron  that was started during this hospitalization.  DM-2 (A1c 4.4 on 1/21) CBGs on the higher side Currently on combination of long-acting insulin , Premeal NovoLog  along with sliding scale dose adjusted on 01/07/2024 for better control.   Recent Labs    01/07/24 1119 01/07/24 1528 01/07/24 2127  GLUCAP 288* 325* 126*     Debility/deconditioning  Due to acute  illness PT/OT eval-SNF recommended.Social worker following-private pay being explored by family  Code status:   Code Status: Limited: Do not attempt resuscitation (DNR) -DNR-LIMITED -Do Not Intubate/DNI    DVT Prophylaxis: Place TED hose Start: 12/14/23 1312 Place and maintain sequential compression device Start: 12/09/23 0555 SCDs Start: 12/08/23 1707 apixaban  (ELIQUIS ) tablet 5 mg    Family Communication: Ex Wife [name]- [phone]-updated on 5/2  Disposition Plan: Status is: Inpatient Remains inpatient appropriate because: Severity of illness   Planned Discharge Destination:Skilled nursing facility   Diet: Diet Order             Diet regular Room service appropriate? Yes; Fluid consistency: Thin  Diet effective now           Diet - low sodium heart healthy           Diet Carb Modified                     Antimicrobial agents: Anti-infectives (From admission, onward)    Start     Dose/Rate Route Frequency Ordered Stop   12/21/23 0000  rifaximin  (XIFAXAN ) 550 MG TABS tablet        550 mg Oral 2 times daily 12/21/23 0826     12/10/23 2200  rifaximin  (XIFAXAN ) tablet 550 mg        550 mg Oral 2 times daily 12/10/23 1815     12/09/23 1000  rifaximin  (XIFAXAN ) tablet 550 mg  Status:  Discontinued        550 mg Per Tube 2 times daily 12/09/23 0827 12/10/23 1815        MEDICATIONS: Scheduled Meds:  amiodarone   100 mg Oral Daily   apixaban   5 mg Oral BID   atorvastatin   40 mg Oral Daily   feeding supplement (GLUCERNA SHAKE)  237 mL Oral TID BM   insulin  aspart  0-9 Units Subcutaneous TID WC   insulin  aspart  3 Units Subcutaneous TID WC   insulin  glargine-yfgn  25 Units Subcutaneous q morning   insulin  glargine-yfgn  8 Units Subcutaneous QHS   lactulose   30 g Oral TID   leptospermum manuka honey  1 Application Topical Daily   magnesium  hydroxide  30 mL Oral BID   midodrine   10 mg Oral TID WC   mirtazapine   15 mg Oral QHS   nutrition supplement (JUVEN)  1  packet Oral BID AC & HS   pantoprazole   40 mg Oral BID AC   potassium chloride   40 mEq Oral Once   rifaximin   550 mg Oral BID   sodium chloride  flush  3 mL Intravenous Q12H   Continuous Infusions:  PRN Meds:.acetaminophen  **OR** acetaminophen , diltiazem , melatonin   I have personally reviewed following labs and imaging studies  LABORATORY DATA:  Recent Labs  Lab 01/04/24 0451 01/05/24 0606 01/06/24 0559 01/07/24 0547 01/08/24 0431  WBC 4.0 5.3 6.8 8.6 5.2  HGB 10.7* 11.0* 10.6* 10.8* 10.3*  HCT 32.5* 33.5* 32.4* 33.2* 30.9*  PLT 126* 125* 115* 129* 110*  MCV 105.9* 106.7* 109.1* 107.4* 105.8*  MCH 34.9* 35.0* 35.7* 35.0* 35.3*  MCHC 32.9 32.8 32.7 32.5 33.3  RDW 16.0* 16.2* 16.0* 15.9* 15.7*  LYMPHSABS 1.0 1.4 1.6 1.1 1.2  MONOABS 0.8 1.0 1.2* 1.2* 0.8  EOSABS 0.1 0.1 0.1 0.1 0.1  BASOSABS 0.0 0.1 0.1 0.1 0.0    Recent Labs  Lab 01/02/24 1610 01/03/24 9604 01/04/24 0451 01/05/24 0606 01/06/24 0559 01/07/24 0547 01/08/24 0431  NA 137 137 140 140 141 143 138  K 3.7 4.1 3.5 3.9 3.7 3.4* 3.3*  CL 103 104 105 106 106 108 104  CO2 24 23 24 22 25 27 24   ANIONGAP 10 10 11 12 10 8 10   GLUCOSE 225* 290* 207* 246* 189* 51* 148*  BUN 7* 7* 6* 12 13 11 14   CREATININE 0.72 0.73 0.70 0.76 0.81 0.82 0.68  AST 64* 63* 64* 66* 60* 58* 65*  ALT 42 40 42 43 40 41 44  ALKPHOS 130* 119 118 127* 128* 126 131*  BILITOT 1.1 1.0 0.9 0.9 0.9 0.9 1.0  ALBUMIN  3.3* 3.0* 3.0* 2.8* 2.9* 2.9* 2.9*  AMMONIA  --   --   --  56* 61*  --   --   MG 1.9 1.8 1.9  --   --   --   --   PHOS 2.7 3.2 3.0  --   --   --   --   CALCIUM  10.2 10.0 10.4* 10.6* 10.6* 10.4* 10.3      Recent Labs  Lab 01/02/24 0336 01/03/24 0338 01/04/24 0451 01/05/24 0606 01/06/24 0559 01/07/24 0547 01/08/24 0431  AMMONIA  --   --   --  56* 61*  --   --   MG 1.9 1.8 1.9  --   --   --   --   CALCIUM  10.2 10.0 10.4* 10.6* 10.6* 10.4* 10.3     Lab Results  Component Value Date   HGBA1C 4.4 (L) 09/19/2023      Radiology Reports  No results found.    Urine analysis:    Component Value Date/Time   COLORURINE YELLOW 12/08/2023 1733   APPEARANCEUR HAZY (A) 12/08/2023 1733   APPEARANCEUR Clear 07/05/2021 1129   LABSPEC 1.019 12/08/2023 1733   PHURINE 7.0 12/08/2023 1733   GLUCOSEU NEGATIVE 12/08/2023 1733   GLUCOSEU NEGATIVE 04/22/2021 1048   HGBUR NEGATIVE 12/08/2023 1733   BILIRUBINUR NEGATIVE 12/08/2023 1733   BILIRUBINUR Negative 07/05/2021 1129   KETONESUR NEGATIVE 12/08/2023 1733   PROTEINUR NEGATIVE 12/08/2023 1733   UROBILINOGEN 0.2 04/22/2021 1048   NITRITE NEGATIVE 12/08/2023 1733   LEUKOCYTESUR NEGATIVE 12/08/2023 1733    Sepsis Labs: Lactic Acid, Venous    Component Value Date/Time   LATICACIDVEN 1.6 12/08/2023 1406    MICROBIOLOGY: No results found for this or any previous visit (from the past 240 hours).  RADIOLOGY STUDIES/RESULTS: No results found.   LOS: 31 days   Signature  -    Lynnwood Sauer M.D on 01/08/2024 at 7:45 AM   -  To page go to [email or web address]

## 2024-01-08 NOTE — TOC Progression Note (Addendum)
 Transition of Care [facility]) - Progression Note    Patient Details  Name: Tony Cantu MRN: [id number] Date of Birth: 05-10-1951  [facility] CM/SW Contact  Juliane Och, LCSW Phone Number: 01/08/2024, 12:29 PM  Clinical Narrative:     12:29 PM Per [facility] admissions, room and board is $330 per day and therapy is $150 per therapy session. CSW attempted to relay information to patient's ex-wife, [name], but there was no response and a voicemail was left.  3:26 PM [name] returned CSW call. CSW provided [name] with [facility] cost information. [name] inquired about [employer] and [employer]. CSW reiterated SNF costs. [name] inquired about costs for [facility]. [name] stated that if [facility] is not an option then second option would be [facility]. CSW attempted to call [facility] SNF admissions regarding costs, but there was no response and a voicemail was left.  Expected Discharge Plan: Skilled Nursing Facility Barriers to Discharge: Continued Medical Work up, English as a second language teacher  Expected Discharge Plan and Services In-house Referral: Clinical Social Work   Post Acute Care Choice: Skilled Nursing Facility Living arrangements for the past 2 months: Single Family Home, Skilled Nursing Facility Expected Discharge Date: 12/21/23                                     Social Determinants of Health (SDOH) Interventions SDOH Screenings   Food Insecurity: Patient Unable To Answer (12/09/2023)  Housing: Patient Unable To Answer (12/09/2023)  Recent Concern: Housing - High Risk (10/30/2023)  Transportation Needs: Patient Unable To Answer (12/09/2023)  Utilities: Patient Unable To Answer (12/09/2023)  Depression (PHQ2-9): Medium Risk (09/13/2023)  Financial Resource Strain: Low Risk  (06/09/2022)  Social Connections: Patient Unable To Answer (12/09/2023)  Recent Concern: Social Connections - Socially Isolated  (10/30/2023)  Tobacco Use: Medium Risk (12/08/2023)    Readmission Risk Interventions     No data to display

## 2024-01-09 DIAGNOSIS — K922 Gastrointestinal hemorrhage, unspecified: Secondary | ICD-10-CM | POA: Diagnosis not present

## 2024-01-09 LAB — CBC WITH DIFFERENTIAL/PLATELET
Abs Immature Granulocytes: 0.02 10*3/uL (ref 0.00–0.07)
Basophils Absolute: 0 10*3/uL (ref 0.0–0.1)
Basophils Relative: 1 %
Eosinophils Absolute: 0.1 10*3/uL (ref 0.0–0.5)
Eosinophils Relative: 1 %
HCT: 33.5 % — ABNORMAL LOW (ref 39.0–52.0)
Hemoglobin: 11.1 g/dL — ABNORMAL LOW (ref 13.0–17.0)
Immature Granulocytes: 0 %
Lymphocytes Relative: 26 %
Lymphs Abs: 1.3 10*3/uL (ref 0.7–4.0)
MCH: 34.8 pg — ABNORMAL HIGH (ref 26.0–34.0)
MCHC: 33.1 g/dL (ref 30.0–36.0)
MCV: 105 fL — ABNORMAL HIGH (ref 80.0–100.0)
Monocytes Absolute: 0.9 10*3/uL (ref 0.1–1.0)
Monocytes Relative: 17 %
Neutro Abs: 2.9 10*3/uL (ref 1.7–7.7)
Neutrophils Relative %: 55 %
Platelets: 122 10*3/uL — ABNORMAL LOW (ref 150–400)
RBC: 3.19 MIL/uL — ABNORMAL LOW (ref 4.22–5.81)
RDW: 15.7 % — ABNORMAL HIGH (ref 11.5–15.5)
WBC: 5.2 10*3/uL (ref 4.0–10.5)
nRBC: 0 % (ref 0.0–0.2)

## 2024-01-09 LAB — COMPREHENSIVE METABOLIC PANEL WITH GFR
ALT: 42 U/L (ref 0–44)
AST: 59 U/L — ABNORMAL HIGH (ref 15–41)
Albumin: 2.9 g/dL — ABNORMAL LOW (ref 3.5–5.0)
Alkaline Phosphatase: 123 U/L (ref 38–126)
Anion gap: 9 (ref 5–15)
BUN: 21 mg/dL (ref 8–23)
CO2: 24 mmol/L (ref 22–32)
Calcium: 10.2 mg/dL (ref 8.9–10.3)
Chloride: 105 mmol/L (ref 98–111)
Creatinine, Ser: 0.8 mg/dL (ref 0.61–1.24)
GFR, Estimated: >60 mL/min (ref >60)
Glucose, Bld: 364 mg/dL — ABNORMAL HIGH (ref 70–99)
Potassium: 4.4 mmol/L (ref 3.5–5.1)
Sodium: 138 mmol/L (ref 135–145)
Total Bilirubin: 1 mg/dL (ref 0.0–1.2)
Total Protein: 6.3 g/dL — ABNORMAL LOW (ref 6.5–8.1)

## 2024-01-09 LAB — GLUCOSE, CAPILLARY
Glucose-Capillary: 281 mg/dL — ABNORMAL HIGH (ref 70–99)
Glucose-Capillary: 249 mg/dL — ABNORMAL HIGH (ref 70–99)
Glucose-Capillary: 244 mg/dL — ABNORMAL HIGH (ref 70–99)
Glucose-Capillary: 143 mg/dL — ABNORMAL HIGH (ref 70–99)

## 2024-01-09 MED ORDER — LACTATED RINGERS IV BOLUS
500.0000 mL | Freq: Once | INTRAVENOUS | Status: AC
Start: 2024-01-09 — End: 2024-01-09
  Administered 2024-01-09: 500 mL via INTRAVENOUS

## 2024-01-09 MED ORDER — ALBUMIN HUMAN 25 % IV SOLN
50.0000 g | Freq: Once | INTRAVENOUS | Status: AC
  Administered 2024-01-09: 12.5 g via INTRAVENOUS
  Filled 2024-01-09: qty 200

## 2024-01-09 MED ORDER — LACTATED RINGERS IV SOLN: INTRAVENOUS | Status: DC

## 2024-01-09 NOTE — Plan of Care (Signed)

## 2024-01-09 NOTE — Plan of Care (Signed)
   Problem: Clinical Measurements: Goal: Will remain free from infection Outcome: Progressing Goal: Diagnostic test results will improve Outcome: Progressing Goal: Respiratory complications will improve Outcome: Progressing Goal: Cardiovascular complication will be avoided Outcome: Progressing   Problem: Nutrition: Goal: Adequate nutrition will be maintained Outcome: Progressing

## 2024-01-09 NOTE — Progress Notes (Addendum)
 Occupational Therapy Treatment Patient Details Name: Tony Cantu MRN: [id number] DOB: [birth year] Today's Date: 01/09/2024   History of present illness Patient is 73 yo male admitted on 12/08/23 for AMS, nausea, vomiting, and dark stool. PMHx significant for type 1 diabetes, A-fib on Eliquis , seizure disorder, aortic stenosis, HTN, peptic ulcer disease, OA with bilateral knee replacement, polyclonal gammopathy, OSA on CPAP, GERD, COPD/asthma, chronic diastolic CHF, cirrhosis, portal hypertension, and BPH.   OT comments  Pt making progress towards OT goals this session. Pt total A for peri care (mod A +2 rolling bed mobility) prior to sitting EOB (mod A +2 safety). Once EOB demonstrating left lateral lean that despite holding onto end of bed with R arm persisted until Pt held R lateral lean on elbow which improved seated balance.  Max A +2 for lateral scoots up the bed, and max A +2 for squat pivot to recliner. Once in recliner Pt participated in grooming tasks as well as holding cup, bringing to mouth and taking sips. Pt continues to benefit from skilled OT in the acute setting as well as afterwards at rehab of <3 hours daily.   Of Note:  BP in sitting 81/62 BP after transfer to recliner 100/69      If plan is discharge home, recommend the following:  Two people to help with walking and/or transfers;A lot of help with bathing/dressing/bathroom;Assistance with cooking/housework;Direct supervision/assist for medications management;Direct supervision/assist for financial management;Assist for transportation;Help with stairs or ramp for entrance;Supervision due to cognitive status   Equipment Recommendations  Other (comment) (defer to next venue of care)    Recommendations for Other Services      Precautions / Restrictions Precautions Precautions: Fall Recall of Precautions/Restrictions: Impaired Precaution/Restrictions Comments: watch BP; Frequent loose stools Restrictions Weight Bearing  Restrictions Per Provider Order: No       Mobility Bed Mobility Overal bed mobility: Needs Assistance Bed Mobility: Rolling, Sidelying to Sit Rolling: Mod assist, +2 for physical assistance, +2 for safety/equipment, Used rails Sidelying to sit: Mod assist, +2 for safety/equipment, Used rails, HOB elevated       General bed mobility comments: Mod +2 assist to roll with cues and assist to reach for rail with RUE utilizing multimodal cues.  Assisted with peri-care in this position, RN applied sacral pad. Mod assist to rise to edge of bed. pushing towards left against help initially.    Transfers Overall transfer level: Needs assistance Equipment used: 2 person hand held assist Transfers: Bed to chair/wheelchair/BSC     Squat pivot transfers: Max assist, +2 physical assistance, +2 safety/equipment, From elevated surface      Lateral/Scoot Transfers: Max assist, +2 physical assistance, +2 safety/equipment (up the bed) General transfer comment: max A +2 for all aspects of transfers. Pt does attempt to help, but ultimately requires max A +2     Balance Overall balance assessment: Needs assistance Sitting-balance support: Feet supported, Single extremity supported Sitting balance-Leahy Scale: Poor Sitting balance - Comments: Sat edge of bed min assist Postural control: Left lateral lean Standing balance support: Bilateral upper extremity supported Standing balance-Leahy Scale: Zero Standing balance comment: dependent on therapy team                           ADL either performed or assessed with clinical judgement   ADL Overall ADL's : Needs assistance/impaired Eating/Feeding: Moderate assistance;Sitting Eating/Feeding Details (indicate cue type and reason): in recliner Grooming: Moderate assistance;Wash/dry face;Sitting Grooming Details (indicate cue type  and reason): in recliner             Lower Body Dressing: Total assistance;Bed level   Toilet Transfer:  Maximal assistance;+2 for physical assistance;+2 for safety/equipment;Squat-pivot Toilet Transfer Details (indicate cue type and reason): simulated through recliner transfer Toileting- Clothing Manipulation and Hygiene: Maximal assistance;+2 for physical assistance;Bed level Toileting - Clothing Manipulation Details (indicate cue type and reason): clean up bed level prior to transfer     Functional mobility during ADLs: Maximal assistance;+2 for physical assistance;+2 for safety/equipment (squat pivot)      Extremity/Trunk Assessment              Vision       Perception     Praxis     Communication Communication Communication: Impaired Factors Affecting Communication: Reduced clarity of speech (initially very soft spoken and mouthing words, but volume improved throughout session)   Cognition Arousal: Alert Behavior During Therapy: WFL for tasks assessed/performed Cognition: No family/caregiver present to determine baseline     Awareness: Intellectual awareness intact, Online awareness impaired Memory impairment (select all impairments): Short-term memory Attention impairment (select first level of impairment): Selective attention, Alternating attention Executive functioning impairment (select all impairments): Problem solving, Reasoning, Sequencing, Organization OT - Cognition Comments: Pt alert, but fatigued, able to answer questions appropriately, expresses needs, likes to joke around                 Following commands: Impaired Following commands impaired: Follows one step commands with increased time, Follows one step commands inconsistently      Cueing   Cueing Techniques: Verbal cues, Tactile cues, Gestural cues, Visual cues  Exercises Other Exercises Other Exercises: Seated balance working midline control, Rt lean onto elbow, mod assist due to resisting    Shoulder Instructions       General Comments Demonstrates pushing tendency towards left, some  mild Rt neglect, and tone in LEs worse while upright and hypotensive..    Pertinent Vitals/ Pain       Pain Assessment Pain Assessment: Faces Faces Pain Scale: Hurts little more Pain Location: buttocks Pain Descriptors / Indicators: Grimacing, Guarding Pain Intervention(s): Monitored during session, Repositioned  Home Living                                          Prior Functioning/Environment              Frequency  Min 2X/week        Progress Toward Goals  OT Goals(current goals can now be found in the care plan section)  Progress towards OT goals: Progressing toward goals (limited)  Acute Rehab OT Goals Patient Stated Goal: to improve activity tolerance OT Goal Formulation: With patient Time For Goal Achievement: 01/18/24 Potential to Achieve Goals: Fair  Plan      Co-evaluation                 AM-PAC OT "6 Clicks" Daily Activity     Outcome Measure   Help from another person eating meals?: A Lot Help from another person taking care of personal grooming?: A Little Help from another person toileting, which includes using toliet, bedpan, or urinal?: Total Help from another person bathing (including washing, rinsing, drying)?: A Lot Help from another person to put on and taking off regular upper body clothing?: A Little Help from another person to put on and taking off  regular lower body clothing?: Total 6 Click Score: 12    End of Session Equipment Utilized During Treatment: Gait belt  OT Visit Diagnosis: Unsteadiness on feet (R26.81);Other abnormalities of gait and mobility (R26.89);Muscle weakness (generalized) (M62.81);Other symptoms and signs involving cognitive function   Activity Tolerance Patient tolerated treatment well (impacted by fatigue)   Patient Left in chair;with call bell/phone within reach;with chair alarm set;with nursing/sitter in room   Nurse Communication Mobility status        Time: 8119-1478 OT Time  Calculation (min): 33 min  Charges: OT General Charges $OT Visit: 1 Visit OT Treatments $Self Care/Home Management : 8-22 mins $Therapeutic Activity: 8-22 mins  [place] OTR/L Acute Rehabilitation Services Office: [phone]  Ebony Goldstein [facility] 01/09/2024, 1:01 PM

## 2024-01-09 NOTE — Progress Notes (Signed)
   01/09/24 1513  Mobility  Activity Transferred from chair to bed  Level of Assistance +2 (takes two people) Nurse, adult.)  Assistive Device MaxiMove  Activity Response Tolerated fair  Mobility Referral Yes  Mobility visit 1 Mobility  Mobility Specialist Start Time (ACUTE ONLY) 1513  Mobility Specialist Stop Time (ACUTE ONLY) 1520  Mobility Specialist Time Calculation (min) (ACUTE ONLY) 7 min   Mobility Specialist: Progress Note  Pt agreeable to mobility session - received in chair. C/o BLE pain and BUE elbow pain - RN present in room and aware. Returned to bed with all needs met - call bell within reach. RN and NT present in room.   Tony Cantu, BS Mobility Specialist Please contact via SecureChat or  Rehab office at [phone].

## 2024-01-09 NOTE — TOC Progression Note (Signed)
 Transition of Care [facility]) - Progression Note    Patient Details  Name: Tony Cantu MRN: [id number] Date of Birth: August 25, 1951  Transition of Care [facility]) CM/SW Contact  Jannice Mends, LCSW Phone Number: 01/09/2024, 12:35 PM  Clinical Narrative:    12:35 PM-Awaiting response from [facility] on more exact costs as requested by POA.   Expected Discharge Plan: Skilled Nursing Facility Barriers to Discharge: Continued Medical Work up, English as a second language teacher  Expected Discharge Plan and Services In-house Referral: Clinical Social Work   Post Acute Care Choice: Skilled Nursing Facility Living arrangements for the past 2 months: Single Family Home, Skilled Nursing Facility Expected Discharge Date: 12/21/23                                     Social Determinants of Health (SDOH) Interventions SDOH Screenings   Food Insecurity: Patient Unable To Answer (12/09/2023)  Housing: Patient Unable To Answer (12/09/2023)  Recent Concern: Housing - High Risk (10/30/2023)  Transportation Needs: Patient Unable To Answer (12/09/2023)  Utilities: Patient Unable To Answer (12/09/2023)  Depression (PHQ2-9): Medium Risk (09/13/2023)  Financial Resource Strain: Low Risk  (06/09/2022)  Social Connections: Patient Unable To Answer (12/09/2023)  Recent Concern: Social Connections - Socially Isolated (10/30/2023)  Tobacco Use: Medium Risk (12/08/2023)    Readmission Risk Interventions     No data to display

## 2024-01-09 NOTE — Progress Notes (Signed)
 PROGRESS NOTE        PATIENT DETAILS Name: Tony Cantu Age: 73 y.o. Sex: male Date of Birth: 1950-10-28 Admit Date: 12/08/2023 Admitting Physician Johnetta Nab, MD FAO:[id number], Gwendalyn Lemma, DO  Brief Summary: Patient is a 73 y.o.  male with history of [name] cirrhosis, PAF on anticoagulation who presented with somnolence/fatigue-was found to have hepatic encephalopathy along with upper GI bleeding.  Hospital course complicated by orthostatic hypotension and mild hypercalcemia.  Significant events: 4/11>> admit to [facility]  Significant studies: 4/11>> CT abdomen/pelvis: Mild circumferential thickening of the rectum-favoring proctitis. 4/11>> CT head: No acute intracranial abnormality. 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  Consults: GI, nephrology  Subjective: Patient in bed, appears comfortable, denies any headache, no fever, no chest pain or pressure, no shortness of breath , no abdominal pain. No new focal weakness.   Objective: Vitals: Blood pressure 110/83, pulse 76, temperature 98.4 F (36.9 C), temperature source Oral, resp. rate 20, height 5' 7.99" (1.727 m), weight 78.3 kg, SpO2 97%.   Exam:  Awake , mildly confused at times in the morning, No new F.N deficits,   [place].AT,PERRAL Supple Neck, No JVD,   Symmetrical Chest Dirr movement, Good air movement bilaterally, CTAB RRR,No Gallops, Rubs or new Murmurs,  +ve B.Sounds, Abd Soft, No tenderness,   No Cyanosis, Clubbing or edema   Assessment/Plan:  Hepatic encephalopathy Likely triggered by upper GI bleeding Resolved with lactulose /Xifaxan  Completely awake/alert this morning Monitor  ammonia intermittently based on his clinical scenario  Upper GI bleeding Secondary to AVMs S/p EGD with APC 4/16 No further bleeding-Hb stable Follow CBC Continue PPI twice daily  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  Chronic HFpEF Euvolemic on exam Diuretics as needed  Hypokalemia Replete/recheck  Mild hypercalcemia Unclear etiology but suspect this is from immobilization-as he has been very reluctant to move for for the past a week or so.  Do not see any culprit medications PTH levels appropriately suppressed, stable vitamin D  levels, stable PTHrp Patient is very reluctant to move out of bed, extensively counseled patient and staff, mobility is highly essential. Per glycemia much improved after gentle IV fluids on 01/01/2024, appreciate nephrology input  PAF Telemetry monitoring Amiodarone  Eliquis .  HLD Continue to hold statins-trend LFTs.  Transaminitis Unclear etiology Downtrending Follow-remains on amiodarone -hence watch closely.  COPD/asthma Stable Bronchodilators  [facility] Has been refusing CPAP here-but apparently uses at home  Failure to thrive syndrome Poor oral intake Continue PT/OT/nutrition eval Appears somewhat depressed-but per patient this is primarily due to his frustration of remaining in the hospital.  He does not want to talk to a psychiatrist. Continue Remeron  that was started during this hospitalization.  DM-2 (A1c 4.4 on 1/21) CBGs on the higher side Currently on combination of long-acting insulin , Premeal NovoLog  along with sliding scale dose adjusted on 01/07/2024 for better control.   Recent Labs    01/08/24 1519 01/08/24 2116 01/09/24 0823  GLUCAP 308* 170* 281*     Debility/deconditioning  Due to acute illness PT/OT eval-SNF  recommended.Social worker following-private pay being explored by family  Code status:   Code Status: Limited: Do not attempt resuscitation (DNR) -DNR-LIMITED -Do Not Intubate/DNI    DVT Prophylaxis: Place TED hose Start: 01/08/24 1339 Place TED hose Start: 12/14/23 1312 Place and maintain sequential compression device Start: 12/09/23 0555 SCDs Start: 12/08/23 1707 apixaban  (ELIQUIS ) tablet 5 mg    Family Communication: Ex Wife [name]- [phone]-updated on 5/2  Disposition Plan: Status is: Inpatient Remains inpatient appropriate because: Severity of illness   Planned Discharge Destination:Skilled nursing facility   Diet: Diet Order             Diet regular Room service appropriate? Yes; Fluid consistency: Thin  Diet effective now           Diet - low sodium heart healthy           Diet Carb Modified                     Antimicrobial agents: Anti-infectives (From admission, onward)    Start     Dose/Rate Route Frequency Ordered Stop   12/21/23 0000  rifaximin  (XIFAXAN ) 550 MG TABS tablet        550 mg Oral 2 times daily 12/21/23 0826     12/10/23 2200  rifaximin  (XIFAXAN ) tablet 550 mg        550 mg Oral 2 times daily 12/10/23 1815     12/09/23 1000  rifaximin  (XIFAXAN ) tablet 550 mg  Status:  Discontinued        550 mg Per Tube 2 times daily 12/09/23 0827 12/10/23 1815        MEDICATIONS: Scheduled Meds:  amiodarone   100 mg Oral Daily   apixaban   5 mg Oral BID   atorvastatin   40 mg Oral Daily   feeding supplement (GLUCERNA SHAKE)  237 mL Oral TID BM   insulin  aspart  0-9 Units Subcutaneous TID WC   insulin  aspart  3 Units Subcutaneous TID WC   insulin  glargine-yfgn  25 Units Subcutaneous q morning   insulin  glargine-yfgn  8 Units Subcutaneous QHS   lactulose   30 g Oral TID   leptospermum manuka honey  1 Application Topical Daily   midodrine   10 mg Oral TID WC   mirtazapine   15 mg Oral QHS   nutrition supplement (JUVEN)  1 packet Oral BID AC & HS    pantoprazole   40 mg Oral BID AC   rifaximin   550 mg Oral BID   sodium chloride  flush  3 mL Intravenous Q12H   Continuous Infusions:  lactated ringers  100 mL/hr at 01/09/24 0729   PRN Meds:.acetaminophen  **OR** acetaminophen , diltiazem , melatonin   I have personally reviewed following labs and imaging studies  LABORATORY DATA:  Recent Labs  Lab 01/05/24 0606 01/06/24 0559 01/07/24 0547 01/08/24 0431 01/09/24 0449  WBC 5.3 6.8 8.6 5.2 5.2  HGB 11.0* 10.6* 10.8* 10.3* 11.1*  HCT 33.5* 32.4* 33.2* 30.9* 33.5*  PLT 125* 115* 129* 110* 122*  MCV 106.7* 109.1* 107.4* 105.8* 105.0*  MCH 35.0* 35.7* 35.0* 35.3* 34.8*  MCHC 32.8 32.7 32.5 33.3 33.1  RDW 16.2* 16.0* 15.9* 15.7* 15.7*  LYMPHSABS 1.4 1.6 1.1 1.2 1.3  MONOABS 1.0 1.2* 1.2* 0.8 0.9  EOSABS 0.1 0.1 0.1 0.1 0.1  BASOSABS 0.1 0.1 0.1 0.0 0.0    Recent Labs  Lab 01/03/24 0338 01/04/24 0451 01/05/24 0606 01/06/24 0559 01/07/24 0547 01/08/24 0431 01/09/24 0449  NA 137  140 140 141 143 138 138  K 4.1 3.5 3.9 3.7 3.4* 3.3* 4.4  CL 104 105 106 106 108 104 105  CO2 23 24 22 25 27 24 24   ANIONGAP 10 11 12 10 8 10 9   GLUCOSE 290* 207* 246* 189* 51* 148* 364*  BUN 7* 6* 12 13 11 14 21   CREATININE 0.73 0.70 0.76 0.81 0.82 0.68 0.80  AST 63* 64* 66* 60* 58* 65* 59*  ALT 40 42 43 40 41 44 42  ALKPHOS 119 118 127* 128* 126 131* 123  BILITOT 1.0 0.9 0.9 0.9 0.9 1.0 1.0  ALBUMIN  3.0* 3.0* 2.8* 2.9* 2.9* 2.9* 2.9*  AMMONIA  --   --  56* 61*  --   --   --   MG 1.8 1.9  --   --   --   --   --   PHOS 3.2 3.0  --   --   --   --   --   CALCIUM  10.0 10.4* 10.6* 10.6* 10.4* 10.3 10.2      Recent Labs  Lab 01/03/24 0338 01/04/24 0451 01/05/24 0606 01/06/24 0559 01/07/24 0547 01/08/24 0431 01/09/24 0449  AMMONIA  --   --  56* 61*  --   --   --   MG 1.8 1.9  --   --   --   --   --   CALCIUM  10.0 10.4* 10.6* 10.6* 10.4* 10.3 10.2     Lab Results  Component Value Date   HGBA1C 4.4 (L) 09/19/2023     Radiology  Reports  No results found.    Urine analysis:    Component Value Date/Time   COLORURINE YELLOW 12/08/2023 1733   APPEARANCEUR HAZY (A) 12/08/2023 1733   APPEARANCEUR Clear 07/05/2021 1129   LABSPEC 1.019 12/08/2023 1733   PHURINE 7.0 12/08/2023 1733   GLUCOSEU NEGATIVE 12/08/2023 1733   GLUCOSEU NEGATIVE 04/22/2021 1048   HGBUR NEGATIVE 12/08/2023 1733   BILIRUBINUR NEGATIVE 12/08/2023 1733   BILIRUBINUR Negative 07/05/2021 1129   KETONESUR NEGATIVE 12/08/2023 1733   PROTEINUR NEGATIVE 12/08/2023 1733   UROBILINOGEN 0.2 04/22/2021 1048   NITRITE NEGATIVE 12/08/2023 1733   LEUKOCYTESUR NEGATIVE 12/08/2023 1733    Sepsis Labs: Lactic Acid, Venous    Component Value Date/Time   LATICACIDVEN 1.6 12/08/2023 1406    MICROBIOLOGY: No results found for this or any previous visit (from the past 240 hours).  RADIOLOGY STUDIES/RESULTS: No results found.   LOS: 32 days   Signature  -    Lynnwood Sauer M.D on 01/09/2024 at 8:52 AM   -  To page go to [email or web address]

## 2024-01-10 LAB — GLUCOSE, CAPILLARY
Glucose-Capillary: 207 mg/dL — ABNORMAL HIGH (ref 70–99)
Glucose-Capillary: 238 mg/dL — ABNORMAL HIGH (ref 70–99)
Glucose-Capillary: 297 mg/dL — ABNORMAL HIGH (ref 70–99)
Glucose-Capillary: 257 mg/dL — ABNORMAL HIGH (ref 70–99)

## 2024-01-10 NOTE — TOC Progression Note (Addendum)
 Transition of Care [facility]) - Progression Note    Patient Details  Name: Tony Cantu MRN: [id number] Date of Birth: Feb 06, 1951  Transition of Care [facility]) CM/SW Contact  Jannice Mends, LCSW Phone Number: 01/10/2024, 9:31 AM  Clinical Narrative:    9:31am-[facility] has declined patient. CSW confirming bed availability at [facility].   12:04 PM-CSW spoke with [name] and made her aware that [facility] a semi-private bed tomorrow. [name] will contact them to arrange paperwork and payment.  Expected Discharge Plan: Skilled Nursing Facility Barriers to Discharge: Continued Medical Work up, English as a second language teacher  Expected Discharge Plan and Services In-house Referral: Clinical Social Work   Post Acute Care Choice: Skilled Nursing Facility Living arrangements for the past 2 months: Single Family Home, Skilled Nursing Facility Expected Discharge Date: 12/21/23                                     Social Determinants of Health (SDOH) Interventions SDOH Screenings   Food Insecurity: Patient Unable To Answer (12/09/2023)  Housing: Patient Unable To Answer (12/09/2023)  Recent Concern: Housing - High Risk (10/30/2023)  Transportation Needs: Patient Unable To Answer (12/09/2023)  Utilities: Patient Unable To Answer (12/09/2023)  Depression (PHQ2-9): Medium Risk (09/13/2023)  Financial Resource Strain: Low Risk  (06/09/2022)  Social Connections: Patient Unable To Answer (12/09/2023)  Recent Concern: Social Connections - Socially Isolated (10/30/2023)  Tobacco Use: Medium Risk (12/08/2023)    Readmission Risk Interventions     No data to display

## 2024-01-10 NOTE — Plan of Care (Signed)

## 2024-01-10 NOTE — Progress Notes (Signed)
   01/10/24 1526  Mobility  Activity Moved into chair position in bed  Level of Assistance Maximum assist, patient does 25-49%  Assistive Device Other (Comment) (Bed Rails)  Range of Motion/Exercises Active Assistive;All extremities  Activity Response Tolerated fair  Mobility Referral Yes  Mobility visit 1 Mobility  Mobility Specialist Start Time (ACUTE ONLY) 1526  Mobility Specialist Stop Time (ACUTE ONLY) 1546  Mobility Specialist Time Calculation (min) (ACUTE ONLY) 20 min   Mobility Specialist: Progress Note -Visits:2  Pre-Mobility:       BP 90/66 During Mobility:  BP 101/65 Post-Mobility:     BP 111/65  Pt agreeable to mobility session - received in bed. C/o BLE Pain, R shoulder pain, and dizziness. Returned to sitting upright in bed with all needs met - call bell within reach. Bed alarm on.   BUE Exercises (x10): raises, bicep curls  BLE Exercises (x10): raises, heel slides, ankle pumps  Seated abdominal crunch (x5)  ---------------------------------------------------------------------------------------------------------------------------  Post-Mobility:    BP 112/68  Pt agreeable to mobility session - received in bed. C/o BLE Pain, R shoulder pain, and dizziness. Returned to sitting upright in bed with all needs met - call bell within reach. Bed alarm on.   BUE Exercises (x10): raises, bicep curls  BLE Exercises (x10): raises, heel slides, ankle pumps  Seated abdominal crunch (x10)  Isla Mari, BS Mobility Specialist Please contact via SecureChat or  Rehab office at [phone].

## 2024-01-10 NOTE — Progress Notes (Signed)
 PROGRESS NOTE        PATIENT DETAILS Name: Tony Cantu Age: 73 y.o. Sex: male Date of Birth: 05-01-1951 Admit Date: 12/08/2023 Admitting Physician Johnetta Nab, MD ZOX:[id number], Gwendalyn Lemma, DO  Brief Summary: Patient is a 73 y.o.  male with history of [name] cirrhosis, PAF on anticoagulation who presented with somnolence/fatigue-was found to have hepatic encephalopathy along with upper GI bleeding.  Hospital course complicated by orthostatic hypotension and mild hypercalcemia.  Significant events: 4/11>> admit to [facility]  Significant studies: 4/11>> CT abdomen/pelvis: Mild circumferential thickening of the rectum-favoring proctitis. 4/11>> CT head: No acute intracranial abnormality. 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  Consults: GI, nephrology  Subjective:   He denies any complaints today, no significant events as discussed with staff    Objective: Vitals: Blood pressure 111/67, pulse 69, temperature 97.9 F (36.6 C), temperature source Oral, resp. rate (!) 23, height 5' 7.99" (1.727 m), weight 78.3 kg, SpO2 93%.   Exam:  Awake, alert, impaired judgment and insight which appears to be baseline, extremely frail and deconditioned Symmetrical Chest [place] movement, Good air movement bilaterally, CTAB RRR,No Gallops,Rubs or new Murmurs, No Parasternal Heave +ve B.Sounds, Abd Soft, No tenderness, No rebound - guarding or rigidity. 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  Chronic HFpEF Euvolemic on exam Diuretics as needed  Hypokalemia Replete/recheck  Mild hypercalcemia Unclear etiology but suspect this is from immobilization-as he has been very reluctant to move for for the past a week or so.  Do not see any culprit medications PTH levels appropriately suppressed, stable vitamin D  levels, stable PTHrp Patient is very reluctant to move out of bed, extensively counseled patient and staff, mobility is highly essential. Per glycemia much improved after gentle IV fluids on 01/01/2024, appreciate nephrology input  PAF Telemetry monitoring Amiodarone  Eliquis .  HLD Continue to hold statins-trend LFTs.  Transaminitis Unclear etiology Downtrending Follow-remains on amiodarone -hence watch closely.  COPD/asthma Stable Bronchodilators  [facility] Has been refusing CPAP here-but apparently uses at home  Failure to thrive syndrome Poor oral intake Continue PT/OT/nutrition eval Appears somewhat depressed-but per patient this is primarily due to his frustration of remaining in the hospital.  He does not want to talk to a psychiatrist. Continue Remeron  that was started during this hospitalization.  DM-2 (A1c 4.4 on 1/21) CBGs on the higher side Currently on combination of long-acting insulin , Premeal NovoLog  along with sliding scale dose adjusted on 01/07/2024 for better control.   Recent Labs    01/09/24 2045 01/10/24 0817 01/10/24 1124  GLUCAP 143* 207* 238*     Debility/deconditioning Due to acute illness PT/OT eval-SNF  recommended.Social worker following-private pay being explored by family  Code status:   Code Status: Limited: Do not attempt resuscitation (DNR) -DNR-LIMITED -Do Not Intubate/DNI    DVT Prophylaxis: Place TED hose Start: 01/08/24 1339 Place TED hose Start: 12/14/23 1312 Place and maintain sequential compression device Start: 12/09/23 0555 SCDs Start: 12/08/23 1707 apixaban  (ELIQUIS ) tablet 5 mg    Family Communication: Ex Wife [name]- [phone]-updated on 5/2  Disposition Plan: Status is: Inpatient Remains inpatient appropriate because: Severity of illness   Planned Discharge Destination:Skilled nursing facility   Diet: Diet Order             Diet regular Room service appropriate? Yes; Fluid consistency: Thin  Diet effective now           Diet - low sodium heart healthy           Diet Carb Modified                     Antimicrobial agents: Anti-infectives (From admission, onward)    Start     Dose/Rate Route Frequency Ordered Stop   12/21/23 0000  rifaximin  (XIFAXAN ) 550 MG TABS tablet        550 mg Oral 2 times daily 12/21/23 0826     12/10/23 2200  rifaximin  (XIFAXAN ) tablet 550 mg        550 mg Oral 2 times daily 12/10/23 1815     12/09/23 1000  rifaximin  (XIFAXAN ) tablet 550 mg  Status:  Discontinued        550 mg Per Tube 2 times daily 12/09/23 0827 12/10/23 1815        MEDICATIONS: Scheduled Meds:  amiodarone   100 mg Oral Daily   apixaban   5 mg Oral BID   atorvastatin   40 mg Oral Daily   feeding supplement (GLUCERNA SHAKE)  237 mL Oral TID BM   insulin  aspart  0-9 Units Subcutaneous TID WC   insulin  aspart  3 Units Subcutaneous TID WC   insulin  glargine-yfgn  25 Units Subcutaneous q morning   insulin  glargine-yfgn  8 Units Subcutaneous QHS   lactulose   30 g Oral TID   leptospermum manuka honey  1 Application Topical Daily   midodrine   10 mg Oral TID WC   mirtazapine   15 mg Oral QHS   nutrition supplement (JUVEN)  1 packet Oral BID AC & HS    pantoprazole   40 mg Oral BID AC   rifaximin   550 mg Oral BID   sodium chloride  flush  3 mL Intravenous Q12H   Continuous Infusions:  lactated ringers  100 mL/hr at 01/10/24 0937   PRN Meds:.acetaminophen  **OR** acetaminophen , diltiazem , melatonin   I have personally reviewed following labs and imaging studies  LABORATORY DATA:  Recent Labs  Lab 01/05/24 0606 01/06/24 0559 01/07/24 0547 01/08/24 0431 01/09/24 0449  WBC 5.3 6.8 8.6 5.2 5.2  HGB 11.0* 10.6* 10.8* 10.3* 11.1*  HCT 33.5* 32.4* 33.2* 30.9* 33.5*  PLT 125* 115* 129* 110* 122*  MCV 106.7* 109.1* 107.4* 105.8* 105.0*  MCH 35.0* 35.7* 35.0* 35.3* 34.8*  MCHC 32.8 32.7 32.5 33.3 33.1  RDW 16.2* 16.0* 15.9* 15.7* 15.7*  LYMPHSABS 1.4 1.6 1.1 1.2 1.3  MONOABS 1.0 1.2* 1.2* 0.8 0.9  EOSABS 0.1 0.1 0.1 0.1 0.1  BASOSABS 0.1 0.1 0.1 0.0 0.0    Recent Labs  Lab 01/04/24 0451 01/05/24 0606 01/06/24 0559 01/07/24 0547 01/08/24 0431 01/09/24 0449  NA 140 140 141 143 138 138  K 3.5 3.9  3.7 3.4* 3.3* 4.4  CL 105 106 106 108 104 105  CO2 24 22 25 27 24 24   ANIONGAP 11 12 10 8 10 9   GLUCOSE 207* 246* 189* 51* 148* 364*  BUN 6* 12 13 11 14 21   CREATININE 0.70 0.76 0.81 0.82 0.68 0.80  AST 64* 66* 60* 58* 65* 59*  ALT 42 43 40 41 44 42  ALKPHOS 118 127* 128* 126 131* 123  BILITOT 0.9 0.9 0.9 0.9 1.0 1.0  ALBUMIN  3.0* 2.8* 2.9* 2.9* 2.9* 2.9*  AMMONIA  --  56* 61*  --   --   --   MG 1.9  --   --   --   --   --   PHOS 3.0  --   --   --   --   --   CALCIUM  10.4* 10.6* 10.6* 10.4* 10.3 10.2      Recent Labs  Lab 01/04/24 0451 01/05/24 0606 01/06/24 0559 01/07/24 0547 01/08/24 0431 01/09/24 0449  AMMONIA  --  56* 61*  --   --   --   MG 1.9  --   --   --   --   --   CALCIUM  10.4* 10.6* 10.6* 10.4* 10.3 10.2     Lab Results  Component Value Date   HGBA1C 4.4 (L) 09/19/2023     Radiology Reports  No results found.    Urine analysis:    Component Value Date/Time   COLORURINE YELLOW 12/08/2023  1733   APPEARANCEUR HAZY (A) 12/08/2023 1733   APPEARANCEUR Clear 07/05/2021 1129   LABSPEC 1.019 12/08/2023 1733   PHURINE 7.0 12/08/2023 1733   GLUCOSEU NEGATIVE 12/08/2023 1733   GLUCOSEU NEGATIVE 04/22/2021 1048   HGBUR NEGATIVE 12/08/2023 1733   BILIRUBINUR NEGATIVE 12/08/2023 1733   BILIRUBINUR Negative 07/05/2021 1129   KETONESUR NEGATIVE 12/08/2023 1733   PROTEINUR NEGATIVE 12/08/2023 1733   UROBILINOGEN 0.2 04/22/2021 1048   NITRITE NEGATIVE 12/08/2023 1733   LEUKOCYTESUR NEGATIVE 12/08/2023 1733    Sepsis Labs: Lactic Acid, Venous    Component Value Date/Time   LATICACIDVEN 1.6 12/08/2023 1406    MICROBIOLOGY: No results found for this or any previous visit (from the past 240 hours).  RADIOLOGY STUDIES/RESULTS: No results found.   LOS: 33 days   Signature  -    Seena Dadds M.D on 01/10/2024 at 2:17 PM   -  To page go to [email or web address]

## 2024-01-11 LAB — GLUCOSE, CAPILLARY
Glucose-Capillary: 178 mg/dL — ABNORMAL HIGH (ref 70–99)
Glucose-Capillary: 320 mg/dL — ABNORMAL HIGH (ref 70–99)
Glucose-Capillary: 293 mg/dL — ABNORMAL HIGH (ref 70–99)

## 2024-01-11 MED ORDER — INSULIN GLARGINE-YFGN 100 UNIT/ML ~~LOC~~ SOLN: 8.0000 [IU] | Freq: Every day | SUBCUTANEOUS | Status: DC

## 2024-01-11 MED ORDER — MIRTAZAPINE 15 MG PO TBDP: 15.0000 mg | ORAL_TABLET | Freq: Every day | ORAL | Status: DC

## 2024-01-11 MED ORDER — INSULIN GLARGINE-YFGN 100 UNIT/ML ~~LOC~~ SOLN: 25.0000 [IU] | Freq: Every morning | SUBCUTANEOUS | Status: DC

## 2024-01-11 MED ORDER — GLUCERNA SHAKE PO LIQD: 237.0000 mL | Freq: Three times a day (TID) | ORAL | Status: DC

## 2024-01-11 MED ORDER — AMIODARONE HCL 100 MG PO TABS
100.0000 mg | ORAL_TABLET | Freq: Every day | ORAL | Status: AC
Start: 2024-01-12 — End: ?

## 2024-01-11 MED ORDER — JUVEN PO PACK: 1.0000 | PACK | Freq: Two times a day (BID) | ORAL | Status: DC

## 2024-01-11 MED ORDER — INSULIN ASPART 100 UNIT/ML IJ SOLN
0.0000 [IU] | Freq: Three times a day (TID) | INTRAMUSCULAR | Status: DC
Start: 2024-01-11 — End: 2024-01-31

## 2024-01-11 NOTE — Progress Notes (Signed)
 Nutrition Follow-up  DOCUMENTATION CODES:   Severe malnutrition in context of chronic illness  INTERVENTION:  Continue Glucerna Shake po TID, each supplement provides 220 kcal and 10 grams of protein Continue MVI w/ minerals Continue 1 packet Juven BID (breakfast and dinner), each packet provides 95 calories, 2.5 grams of protein (collagen), and 9.8 grams of carbohydrate (3 grams sugar); also contains 7 grams of L-arginine and L-glutamine, 300 mg vitamin C, 15 mg vitamin E, 1.2 mcg vitamin B-12, 9.5 mg zinc, 200 mg calcium , and 1.5 g  Calcium  Beta-hydroxy-Beta-methylbutyrate to support wound healing   NUTRITION DIAGNOSIS:  Severe Malnutrition related to chronic illness (NASH cirrhosis, T1DM, HTN) as evidenced by severe muscle depletion, severe fat depletion, percent weight loss. - remains applicable  GOAL:  Patient will meet greater than or equal to 90% of their needs - progressing  MONITOR:  PO intake, Supplement acceptance, Weight trends  REASON FOR ASSESSMENT:   Consult Assessment of nutrition requirement/status  ASSESSMENT:  Pt with PMH significant for NASH cirrhosis, PAF, GERD, T1DM, HTN, arthritis who presented with somnolence/fatigue and was found to have hepatic encephalopathy along with upper GIB. Hospitalization c/b orthostatic hypotension.  4/11 admitted 4/11 CT abd/pelvis: mild circumferential thickening of rectum favoring proctitis 4/13 NGT removed 4/16 EGD: portal hypertensive gastropathy, single AVM-s/p APC, GAVE-s/p APC, 2 bleeding angioectasias in duodenum 4/30 PTH low, vitamin D  wdl   Pt is medically stable for discharge. Possibly today.  Intake remains varied, however Glucerna being accepted to supplement.   Average Meal Intake 5/9: 0-30% x3 documented meals 5/13: 5-60% x2 documented meals 5/14: 20-50% x3 documented meal   No problems with chewing or swallowing. Discussed need for consistently improved intake to promote wound healing and progression with  therapies.    Admit Weight: 75.1kg Current Weight: 78.3kg   Wt up this admission. No edema on exam. Bowels stable.   Drains/Lines: Urinary catheter UOP: 3.5L x24 hours  Insulin  regimen continues to be modified with good effect. No potassium supplementation has been required since 5/12.   Meds: SSI 0-9 TID, SSI 3 TID, Semglee  8 at bedtime, Semglee  25 every morning, lactulose , pantoprazole , mirtazapine     Labs: no new labs for review   Diet Order:   Diet Order             Diet regular Room service appropriate? Yes; Fluid consistency: Thin  Diet effective now           Diet - low sodium heart healthy           Diet Carb Modified            EDUCATION NEEDS:  Not appropriate for education at this time  Skin:  Skin Assessment: Skin Integrity Issues: Skin Integrity Issues:: Stage III Stage III: mid sacrum Other: MASD to mid sacrum  Last BM:  5/14 - type 6 x1  Height:  Ht Readings from Last 1 Encounters:  12/08/23 5' 7.99" (1.727 m)   Weight:  Wt Readings from Last 1 Encounters:  01/06/24 78.3 kg   Ideal Body Weight:  70 kg  BMI:  Body mass index is 26.25 kg/m.  Estimated Nutritional Needs:   Kcal:  1800-2000kcal  Protein:  95-110g  Fluid:  >1.8L/day  Con Decant MS, RD, LDN Registered Dietitian Clinical Nutrition RD Inpatient Contact Info in Amion

## 2024-01-11 NOTE — Progress Notes (Signed)
 Occupational Therapy Treatment Patient Details Name: Tony Cantu MRN: [id number] DOB: 1951/06/01 Today's Date: 01/11/2024   History of present illness Patient is 73 yo male admitted on 12/08/23 for AMS, nausea, vomiting, and dark stool. PMHx significant for type 1 diabetes, A-fib on Eliquis , seizure disorder, aortic stenosis, HTN, peptic ulcer disease, OA with bilateral knee replacement, polyclonal gammopathy, OSA on CPAP, GERD, COPD/asthma, chronic diastolic CHF, cirrhosis, portal hypertension, and BPH.   OT comments  "I'm going to the eye doctor today". Pt soiled on entry to room so session incorporated bed mobility to clean pt and reposition him in bed. Increased ability to roll, which improves with repetition. Worked on bridging to offload hips to facilitate rolling. Mod to Max VC throughout session for [name] to open his eyes. [name] assisted with self care with mod to max A @ bed level. Pt left in modified chair position in bed. Maximove pad left under [name] with plan for mobility specialists to follow therapy to mobilize OOB to chair. Pt needs pressure redistribution pad in chair and would benefit from low air loss mattress. Patient will benefit from continued inpatient follow up therapy, <3 hours/day to maximize functional level of independence.      If plan is discharge home, recommend the following:  Two people to help with walking and/or transfers;A lot of help with bathing/dressing/bathroom;Assistance with cooking/housework;Direct supervision/assist for medications management;Direct supervision/assist for financial management;Assist for transportation;Help with stairs or ramp for entrance;Supervision due to cognitive status   Equipment Recommendations  Other (comment) (TBD at SNF)    Recommendations for Other Services      Precautions / Restrictions Precautions Precautions: Fall Precaution/Restrictions Comments: watch BP; Frequent loose stoolsstage 3 sacral pressure wound; L  ischium/buttocks wound       Mobility Bed Mobility Overal bed mobility: Needs Assistance Bed Mobility: Rolling Rolling: Mod assist         General bed mobility comments: multiple rolling; cues ot look to side, reach across body and push with flexed leg to facilitate rolling; after mulitple repetitions, support only given to hip while holding flexed leg    Transfers                         Balance                                           ADL either performed or assessed with clinical judgement   ADL Overall ADL's : Needs assistance/impaired Eating/Feeding: Supervision/ safety;Set up   Grooming: Moderate assistance   Upper Body Bathing: Moderate assistance;Bed level       Upper Body Dressing : Moderate assistance                   Functional mobility during ADLs: Maximal assistance      Extremity/Trunk Assessment Upper Extremity Assessment Upper Extremity Assessment: Generalized weakness   Lower Extremity Assessment Lower Extremity Assessment: Defer to PT evaluation        Vision       Perception     Praxis     Communication Communication Communication: No apparent difficulties   Cognition Arousal: Alert Behavior During Therapy: WFL for tasks assessed/performed Cognition: Cognition impaired   Orientation impairments: Place, Time, Situation Awareness: Intellectual awareness impaired (pt thinks he is goig to the eye docotr) Memory impairment (select all impairments): Short-term memory, Working [employer] fast streamer,  Declarative long-term memory Attention impairment (select first level of impairment): Sustained attention Executive functioning impairment (select all impairments): Initiation, Sequencing, Reasoning, Problem solving, Organization                   Following commands: Impaired Following commands impaired: Follows one step commands with increased time      Cueing   Cueing Techniques: Verbal cues, Tactile  cues, Visual cues  Exercises Exercises: General Upper Extremity General Exercises - Upper Extremity Shoulder Flexion: AROM, Strengthening, Both, 20 reps Elbow Flexion: AROM, Strengthening, Both, 10 reps (against therapist resistance) Elbow Extension: Strengthening, Both, 15 reps (against therapist resistance) Other Exercises Other Exercises: "windshield wiper" BLE exercise to facilitate rolling and pushing through leg to elevate hip form bed. Attmepted bridging    Shoulder Instructions       General Comments      Pertinent Vitals/ Pain       Pain Assessment Pain Assessment: Faces Faces Pain Scale: Hurts even more Pain Location: buttocks with cleaning Pain Descriptors / Indicators: Grimacing Pain Intervention(s): Limited activity within patient's tolerance  Home Living                                          Prior Functioning/Environment              Frequency  Min 2X/week        Progress Toward Goals  OT Goals(current goals can now be found in the care plan section)  Progress towards OT goals: Progressing toward goals  Acute Rehab OT Goals Patient Stated Goal: to go to the eye doctor today OT Goal Formulation: With patient Time For Goal Achievement: 01/18/24 Potential to Achieve Goals: Fair ADL Goals Pt Will Perform Eating: with set-up;sitting Pt Will Perform Grooming: with set-up;sitting Pt Will Perform Upper Body Dressing: with supervision;sitting Pt Will Perform Lower Body Dressing: with mod assist;sit to/from stand;sitting/lateral leans Pt Will Transfer to Toilet: with min assist;stand pivot transfer Pt Will Perform Toileting - Clothing Manipulation and hygiene: with mod assist;sitting/lateral leans;sit to/from stand Additional ADL Goal #1: Pt will complete bed mobility with CGA in preparation for OOB functional tasks  Plan      Co-evaluation                 AM-PAC OT "6 Clicks" Daily Activity     Outcome Measure   Help  from another person eating meals?: A Little Help from another person taking care of personal grooming?: A Lot Help from another person toileting, which includes using toliet, bedpan, or urinal?: Total Help from another person bathing (including washing, rinsing, drying)?: A Lot Help from another person to put on and taking off regular upper body clothing?: A Lot Help from another person to put on and taking off regular lower body clothing?: Total 6 Click Score: 11    End of Session    OT Visit Diagnosis: Unsteadiness on feet (R26.81);Other abnormalities of gait and mobility (R26.89);Muscle weakness (generalized) (M62.81);Other symptoms and signs involving cognitive function   Activity Tolerance Patient tolerated treatment well   Patient Left in bed;with call bell/phone within reach;with bed alarm set;Other (comment) (plan for moblity specialist to [facility] to chair)   Nurse Communication Other (comment) (pt soiled; wound on L buttock)        Time: 3244-0102 OT Time Calculation (min): 33 min  Charges: OT General Charges $OT Visit: 1 Visit  OT Treatments $Self Care/Home Management : 23-37 mins  Milburn Aliment, OT/L   Acute OT Clinical Specialist Acute Rehabilitation Services Pager [phone] Office [phone]   [facility] 01/11/2024, 1:18 PM

## 2024-01-11 NOTE — Plan of Care (Signed)

## 2024-01-11 NOTE — TOC Transition Note (Signed)
 Transition of Care [facility]) - Discharge Note   Patient Details  Name: Tony Cantu MRN: [id number] Date of Birth: 1951-05-18  [facility]) CM/SW Contact:  Jannice Mends, LCSW Phone Number: 01/11/2024, 2:55 PM   Clinical Narrative:     Patient will DC to: [facility] Anticipated DC date: 01/11/24 Family notified: [name] Transport by: Lyna Sandhoff   Per MD patient ready for DC to [facility]. RN to call report prior to discharge [phone] room A2-2). RN, patient, patient's family, and facility notified of DC. Discharge Summary and FL2 sent to facility. DC packet on chart including signed DNR. Ambulance transport requested for patient.   CSW will sign off for now as social work intervention is no longer needed. Please consult us  again if new needs arise.     Final next level of care: Skilled Nursing Facility Barriers to Discharge: Barriers Resolved   Patient Goals and CMS Choice Patient states their goals for this hospitalization and ongoing recovery are:: Return to SNF CMS Medicare.gov Compare Post Acute Care list provided to:: Patient Represenative (must comment) Choice offered to / list presented to : [name] POA / Guardian (ew-wife/hcpoa) [place] ownership interest in [facility].provided to:: [facility] POA / Guardian    Discharge Placement   Existing PASRR number confirmed : 01/11/24          Patient chooses bed at:  [name]) Patient to be transferred to facility by: [facility] Name of family member notified: [name] Patient and family notified of of transfer: 01/11/24  Discharge Plan and Services Additional resources added to the After Visit Summary for   In-house Referral: Clinical Social Work   Post Acute Care Choice: Skilled Nursing Facility                               Social Drivers of Health (SDOH) Interventions SDOH Screenings   Food Insecurity: Patient Unable To Answer (12/09/2023)  Housing: Patient Unable To Answer  (12/09/2023)  Recent Concern: Housing - High Risk (10/30/2023)  Transportation Needs: Patient Unable To Answer (12/09/2023)  Utilities: Patient Unable To Answer (12/09/2023)  Depression (PHQ2-9): Medium Risk (09/13/2023)  Financial Resource Strain: Low Risk  (06/09/2022)  Social Connections: Patient Unable To Answer (12/09/2023)  Recent Concern: Social Connections - Socially Isolated (10/30/2023)  Tobacco Use: Medium Risk (12/08/2023)     Readmission Risk Interventions     No data to display

## 2024-01-11 NOTE — Inpatient Diabetes Management (Signed)
 Inpatient Diabetes Program Recommendations  AACE/ADA: New Consensus Statement on Inpatient Glycemic Control (2015)  Target Ranges:  Prepandial:   less than 140 mg/dL      Peak postprandial:   less than 180 mg/dL (1-2 hours)      Critically ill patients:  140 - 180 mg/dL   Lab Results  Component Value Date   GLUCAP 320 (H) 01/11/2024   HGBA1C 4.4 (L) 09/19/2023    Review of Glycemic Control  Latest Reference Range & Units 01/09/24 20:45 01/10/24 08:17 01/10/24 11:24 01/10/24 16:24 01/10/24 22:21 01/11/24 08:27 01/11/24 12:05  Glucose-Capillary 70 - 99 mg/dL 161 (H) 096 (H) 045 (H) 297 (H) 257 (H) 178 (H) 320 (H)   Diabetes history: DM  Outpatient Diabetes medications:  Humalog  2-15 units tid with meals Lantus  24 units bid Current orders for Inpatient glycemic control:  Novolog  0-9 units tid with meals Semglee  25 units q AM and Semglee  8 units q PM Novolog  3 units tid with meals   Inpatient Diabetes Program Recommendations:   Consider increasing PM Semglee  to 12 units .  Also consider increasing Novolog  meal coverage to 4 units tid with meals (hold if patient eats less than 50% or NPO).   Thanks,  Josefa Ni, RN, BC-ADM Inpatient Diabetes Coordinator Pager [phone]  (8a-5p)

## 2024-01-11 NOTE — Plan of Care (Signed)
  Problem: Education: Goal: Knowledge of General Education information will improve Description: Including pain rating scale, medication(s)/side effects and non-pharmacologic comfort measures Outcome: Progressing   Problem: Clinical Measurements: Goal: Respiratory complications will improve Outcome: Progressing   Problem: Clinical Measurements: Goal: Cardiovascular complication will be avoided Outcome: Progressing   Problem: Activity: Goal: Risk for activity intolerance will decrease Outcome: Progressing   Problem: Nutrition: Goal: Adequate nutrition will be maintained Outcome: Progressing   Problem: Coping: Goal: Level of anxiety will decrease Outcome: Progressing   Problem: Elimination: Goal: Will not experience complications related to bowel motility Outcome: Progressing   Problem: Pain Managment: Goal: General experience of comfort will improve and/or be controlled Outcome: Progressing   Problem: Safety: Goal: Ability to remain free from injury will improve Outcome: Progressing   Problem: Skin Integrity: Goal: Risk for impaired skin integrity will decrease Outcome: Progressing   Problem: Coping: Goal: Ability to adjust to condition or change in health will improve Outcome: Progressing   Problem: Nutritional: Goal: Maintenance of adequate nutrition will improve Outcome: Progressing

## 2024-01-11 NOTE — Progress Notes (Addendum)
   01/11/24 1315  Mobility  Activity Transferred from bed to chair  Level of Assistance +2 (takes two people) (MaxA)  Assistive Device MaxiMove  Activity Response Tolerated fair  Mobility Referral Yes  Mobility visit 1 Mobility  Mobility Specialist Start Time (ACUTE ONLY) 1315  Mobility Specialist Stop Time (ACUTE ONLY) 1331  Mobility Specialist Time Calculation (min) (ACUTE ONLY) 16 min   Mobility Specialist: Progress Note  Pre-Mobility:      HR 80, SpO2 95% During Mobility: HR 88,  Post-Mobility:    HR 84, SpO2 95 %, BP 118/70   Pt agreeable to mobility session - received in bed. C/o R shoulder pain and BLE with facial grimacing. Returned to sitting upright in bed with all needs met - call bell within reach. Bed alarm on.  ___________________________________________________________________________  Post-Mobility:    HR 81, SpO2 94% RA, BP 115/75 (in reclined chair)  Pt agreeable to mobility session - received in bed. Pt was asymptomatic throughout session, no complaints. Pt thinks he is in for cataract surgery, states ' I want to go wait in the car." Returned to chair with all needs met - call bell within reach. Chair alarm on.   Isla Mari, BS Mobility Specialist Please contact via SecureChat or  Rehab office at [phone].

## 2024-01-11 NOTE — Discharge Summary (Signed)
 Physician Discharge Summary  Tony Cantu QQV:[id number] DOB: April 06, 1951 DOA: 12/08/2023  PCP: [name], DO  Admit date: 12/08/2023 Discharge date: 01/11/2024  Admitted From: (Home) Disposition:  (SNF)  Recommendations for Outpatient Follow-up:  Please obtain BMP/CMP in one week Please follow up on the following pending results:   Diet recommendation: Heart Healthy / Carb Modified   Brief/Interim Summary:   Patient is a 73 y.o.  male with history of [place] cirrhosis, PAF on anticoagulation who presented with somnolence/fatigue-was found to have hepatic encephalopathy along with upper GI bleeding.  Hospital course complicated by orthostatic hypotension and mild hypercalcemia.  CODE STATUS: DNR      Procedures: 4/16>> EGD: Portal hypertensive gastropathy, single AVM-s/p APC, GAVE-s/p APC, 2 bleeding angioectasias in the duodenum treated with APC.   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   Chronic HFpEF Euvolemic on exam For diuresis   Hypokalemia Replaced   Mild hypercalcemia Unclear etiology but suspect this is from immobilization-as he has been very reluctant to move for for the past a week or so.  Do not see any culprit medications PTH levels appropriately suppressed, stable vitamin D  levels,  stable PTHrp Patient is very reluctant to move out of bed, extensively counseled patient and staff, mobility is highly essential. Per glycemia much improved after gentle IV fluids on 01/01/2024, appreciate nephrology input   PAF Amiodarone  Eliquis .   HLD Continue to hold statins-trend LFTs.   Transaminitis Unclear etiology Downtrending Follow-remains on amiodarone -hence watch closely.   COPD/asthma Stable Bronchodilators   OSA Has been refusing CPAP .   Failure to thrive syndrome Poor oral intake Severe protein calorie malnutrition Appears somewhat depressed-but per patient this is primarily due to his frustration of remaining in the hospital.  He does not want to talk to a psychiatrist. Continue Remeron  that was started during this hospitalization.   DM-2 (A1c 4.4 on 1/21) Currently on combination of long-acting insulin  along with sliding scale      Debility/deconditioning Due to acute illness PT/OT eval-SNF recommended.   Sacral, pressure ulcer, stage 3, not present on admission Continue with wound care Pressure Injury 12/29/23 Sacrum Mid Stage 3 -  Full thickness tissue loss. Subcutaneous fat may be visible but bone, tendon or muscle are NOT exposed. (Active)  12/29/23 1400  Location: Sacrum  Location Orientation: Mid  Staging: Stage 3 -  Full thickness tissue loss. Subcutaneous fat may be visible but bone, tendon or muscle are NOT exposed.  Wound Description (Comments):   Present on Admission: No       Hospital stay was prolonged due to disposition please see social worker notes.     Discharge Diagnoses:  Principal Problem:   Acute GI bleeding Active Problems:   Asthma   Obesity   OSA (obstructive sleep apnea)   Diabetes type 2, controlled (HCC)   BPH (benign prostatic hyperplasia)   Paroxysmal tachycardia (HCC)   Hypertension associated with type 2 diabetes mellitus (  HCC)   Hyperlipidemia associated with type 2 diabetes mellitus (HCC)   GERD  with esophagitis   PAF (paroxysmal atrial fibrillation) (HCC)   Grade I diastolic dysfunction   Chronic obstructive pulmonary disease (HCC)   NAFLD (nonalcoholic fatty liver disease)   Portal hypertension (HCC)   Melena   Other cirrhosis of liver (HCC)   Hepatic encephalopathy (HCC)   ABLA (acute blood loss anemia)   Altered mental status    Discharge Instructions  Discharge Instructions     Call MD for:  extreme fatigue   Complete by: As directed    Call MD for:  persistant dizziness or light-headedness   Complete by: As directed    Diet - low sodium heart healthy   Complete by: As directed    Diet - low sodium heart healthy   Complete by: As directed    Diet Carb Modified   Complete by: As directed    Discharge instructions   Complete by: As directed    Follow with Primary MD  [name], DO in 1-2 weeks  Please get a complete blood count and chemistry panel checked by your Primary MD at your next visit, and again as instructed by your Primary MD.  Get Medicines reviewed and adjusted: Please take all your medications with you for your next visit with your Primary MD  Laboratory/radiological data: Please request your Primary MD to go over all hospital tests and procedure/radiological results at the follow up, please ask your Primary MD to get all Hospital records sent to his/her office.  In some cases, they will be blood work, cultures and biopsy results pending at the time of your discharge. Please request that your primary care M.D. follows up on these results.  Also Note the following: If you experience worsening of your admission symptoms, develop shortness of breath, life threatening emergency, suicidal or homicidal thoughts you must seek medical attention immediately by calling 911 or calling your MD immediately  if symptoms less severe.  You must read complete instructions/literature along with all the possible adverse reactions/side effects for all the  Medicines you take and that have been prescribed to you. Take any new Medicines after you have completely understood and accpet all the possible adverse reactions/side effects.   Do not drive when taking Pain medications or sleeping medications (Benzodaizepines)  Do not take more than prescribed Pain, Sleep and Anxiety Medications. It is not advisable to combine anxiety,sleep and pain medications without talking with your primary care practitioner  Special Instructions: If you have smoked or chewed Tobacco  in the last 2 yrs please stop smoking, stop any regular Alcohol  and or any Recreational drug use.  Wear Seat belts while driving.  Please note: You were cared for by a hospitalist during your hospital stay. Once you are discharged, your primary care physician will handle any further medical issues. Please note that NO REFILLS for any discharge medications will be authorized once you are discharged, as it is imperative that you return to your primary care physician (or establish a relationship with a primary care physician if you do not have one) for your post hospital discharge needs so that they can reassess your need for medications and monitor your lab values.   Non-Pharmacologic Reccomendations for Orthostatic Hypotension 1) Lifestyle modification. These measures include:             - Arising slowly, in stages, from supine to seated to standing. This maneuver is most important in the  morning, when orthostatic tolerance is lowest.             - Avoiding straining, coughing, and walking in hot weather.These activities reduce venous return and worsen orthostatic hypotension.             - Maintaining hydration and avoiding over-heating.             - Raising the head of the bed 30 to 45 degrees decreases renal perfusion, thereby activating the renin-angiotensin-aldosterone system and decreasing nocturnal diuresis, which can be pronounced in these patients.  These changes relieve orthostatic  hypotension by expanding extracellular fluid volume and may reduce end organ damage by reducing supine HTN.             2) Exercise - walking 30 minutes a day, exercise in a swimming pool, exercise in a recumbent or seated position (using a stationary bike or rowing machine)             3) Abdominal binders              4) Compression Stockings             5) Increased salt and water  intake to 2 L to 2.5 L of water  a day             6) Modification of meals:             - Avoiding large meals             - Ingesting meals low in carbohydrates             - Alcohol should be avoided during the day as it is a vasodilator             - Drink water  with meals             - Avoiding activities or sudden standing immediately after eating             7) Physical Countermaneuvers during daily activities: leg crossing, standing on tip toes, squatting   Check CBGs AC and at bedtime.   Discharge wound care:   Complete by: As directed    Wound care  3 times daily      Comments: Cleanse buttock/sacrum/coccyx with Vashe wound cleanser Timm Foot [phone]), do not rinse and allow to air dry. Apply Gerhardt's Butt Cream 3 times a day and prn soiling.   Discharge wound care:   Complete by: As directed    Comments: Cleanse sacral wound with Vashe wound cleanser Timm Foot [phone]), do not rinse and allow to air dry. Apply Medihoney to wound bed daily, cover with dry gauze and silicone foam  12/29/23 1601   Increase activity slowly   Complete by: As directed    Increase activity slowly   Complete by: As directed       Allergies as of 01/11/2024       Reactions   Dimetapp Children's Cold-cough Other (See Comments)   Chest discomfort    Ak-mycin [erythromycin] Diarrhea   Sulfonamide Derivatives Diarrhea        Medication List     STOP taking these medications    amoxicillin -clavulanate 875-125 MG tablet Commonly known as: AUGMENTIN    busPIRone 5 MG tablet Commonly known as: BUSPAR   HumaLOG   KwikPen 100 UNIT/ML KwikPen Generic drug: insulin  lispro   insulin  lispro 100 UNIT/ML injection Commonly known as: HUMALOG    Lantus  SoloStar 100 UNIT/ML Solostar Pen Generic drug: insulin  glargine  potassium chloride  SA 20 MEQ tablet Commonly known as: KLOR-CON  M   ramelteon  8 MG tablet Commonly known as: Rozerem        TAKE these medications    acetaminophen  500 MG tablet Commonly known as: TYLENOL  Take 1,000 mg by mouth every 6 (six) hours as needed for moderate pain.   amiodarone  100 MG tablet Commonly known as: PACERONE  Take 1 tablet (100 mg total) by mouth daily. Start taking on: Jan 12, 2024 What changed:  medication strength how much to take how to take this when to take this additional instructions   apixaban  5 MG Tabs tablet Commonly known as: ELIQUIS  Take 1 tablet (5 mg total) by mouth 2 (two) times daily.   atorvastatin  40 MG tablet Commonly known as: LIPITOR Take 1 tablet (40 mg total) by mouth daily. What changed: when to take this   b complex vitamins tablet Take 1 tablet by mouth daily.   dextromethorphan-guaiFENesin 10-100 MG/5ML liquid Commonly known as: ROBITUSSIN-DM Take 10 mLs by mouth in the morning and at bedtime.   ergocalciferol  1.25 MG (50000 UT) capsule Commonly known as: VITAMIN D2 Take 50,000 Units by mouth See admin instructions. Take 1 capsule (50,000 units) by mouth twice weekly on Wednesday and Sunday.   ferrous sulfate  325 (65 FE) MG EC tablet Take 1 tablet (325 mg total) by mouth daily with breakfast.   Glucagon  Emergency 1 MG Kit INJECT AS DIRECTED INTO  UPPER ARM, THIGH OR  BUTTOCKS AS NEEDED FOR  SEVERE HYPOGLYCEMIA. SEEK  MEDICAL ATTENTION AFTER USE   Glucerna Liqd Take 120-237 mLs by mouth See admin instructions. Drink 1 bottle 3 times daily, after meals at 1000, 1300 and 1800. If BS < 60, provide of Glucerna and recheck blood sugar in 15 minutes.   insulin  aspart 100 UNIT/ML injection Commonly known as:  novoLOG  Inject 0-9 Units into the skin 3 (three) times daily with meals. Filed action:Not Prescribed insulin  aspart (novoLOG ) injection 0-9 Units  0-9 Units, Subcutaneous, 3 times daily with meals, First dose on Fri 12/15/23 at 1200 Correction coverage: Sensitive (thin, NPO, renal) CBG < 70: Implement Hypoglycemia Standing Orders and refer to Hypoglycemia Standing Orders sidebar report CBG 70 - 120: 0 units CBG 121 - 150: 1 unit CBG 151 - 200: 2 units CBG 201 - 250: 3 units CBG 251 - 300: 5 units CBG 301 - 350: 7 units CBG 351 - 400: 9 units   insulin  glargine-yfgn 100 UNIT/ML injection Commonly known as: SEMGLEE  Inject 0.08 mLs (8 Units total) into the skin at bedtime.   insulin  glargine-yfgn 100 UNIT/ML injection Commonly known as: SEMGLEE  Inject 0.25 mLs (25 Units total) into the skin every morning. Start taking on: Jan 12, 2024   ipratropium-albuterol  0.5-2.5 (3) MG/3ML Soln Commonly known as: DUONEB Take 3 mLs by nebulization every 6 (six) hours as needed.   lactulose  10 GM/15ML solution Commonly known as: CHRONULAC  Take 45 mLs (30 g total) by mouth 3 (three) times daily.   magnesium  oxide 400 (240 Mg) MG tablet Commonly known as: MAG-OX Take 400 mg by mouth daily.   melatonin 3 MG Tabs tablet Take 1 tablet (3 mg total) by mouth at bedtime as needed.   midodrine  10 MG tablet Commonly known as: PROAMATINE  Take 1 tablet (10 mg total) by mouth 3 (three) times daily with meals.   mirtazapine  15 MG disintegrating tablet Commonly known as: REMERON  SOL-TAB Take 1 tablet (15 mg total) by mouth at bedtime.   montelukast  10 MG  tablet Commonly known as: SINGULAIR  Take 1 tablet (10 mg total) by mouth at bedtime.   multivitamin with minerals tablet Take 1 tablet by mouth daily.   pantoprazole  40 MG tablet Commonly known as: PROTONIX  TAKE 1 TABLET BY MOUTH TWICE  DAILY BEFORE MEALS   rifaximin  550 MG Tabs tablet Commonly known as: XIFAXAN  Take 1 tablet (550 mg total) by mouth  2 (two) times daily.               Discharge Care Instructions  (From admission, onward)           Start     Ordered   01/11/24 0000  Discharge wound care:       Comments: Comments: Cleanse sacral wound with Vashe wound cleanser Timm Foot [phone]), do not rinse and allow to air dry. Apply Medihoney to wound bed daily, cover with dry gauze and silicone foam  12/29/23 1601   01/11/24 1351   12/21/23 0000  Discharge wound care:       Comments: Wound care  3 times daily      Comments: Cleanse buttock/sacrum/coccyx with Vashe wound cleanser Timm Foot [phone]), do not rinse and allow to air dry. Apply Gerhardt's Butt Cream 3 times a day and prn soiling.   12/21/23 0826            Contact information for follow-up providers     Vicky Grange M, DO. Schedule an appointment as soon as possible for a visit in 1 week(s).   Specialty: Family Medicine Contact information: [address] [place] [address] [phone]              Contact information for after-discharge care     Destination     [facility] .   Service: Skilled Nursing Contact information: [address] [place]  [address] [phone]                    Allergies  Allergen Reactions   Dimetapp Children's Cold-Cough Other (See Comments)    Chest discomfort    Ak-Mycin [Erythromycin] Diarrhea   Sulfonamide Derivatives Diarrhea      Subjective:  No significant events overnight as discussed with staff, he denies any complaints today Discharge Exam: Vitals:   01/11/24 0746 01/11/24 1202  BP: 100/63 (!) 100/59  Pulse: 73 71  Resp: 19 19  Temp: 97.7 F (36.5 C) 97.6 F (36.4 C)  SpO2: 96% 96%   Vitals:   01/11/24 0000 01/11/24 0400 01/11/24 0746 01/11/24 1202  BP:  120/60 100/63 (!) 100/59  Pulse:  77 73 71  Resp:  (!) 21 19 19   Temp:   97.7 F (36.5 C) 97.6 F (36.4 C)  TempSrc:   Oral Oral  SpO2: 97%  96%  96%  Weight:      Height:        General: Pt is alert, awake, frail, deconditioned, not in acute distress he is with dementia at baseline Cardiovascular: RRR, S1/S2 +, no rubs, no gallops Respiratory: CTA bilaterally, no wheezing, no rhonchi Abdominal: Soft, NT, ND, bowel sounds + Extremities: no edema, no cyanosis    The results of significant diagnostics from this hospitalization (including imaging, microbiology, ancillary and laboratory) are listed below for reference.     Microbiology: No results found for this or any previous visit (from the past 240 hours).   Labs: BNP (last 3 results) Recent Labs    12/11/23 0331 12/12/23 0343 12/13/23 0102  BNP 351.3* 388.1* 334.3*   Basic Metabolic Panel: Recent Labs  Lab 01/05/24 0606 01/06/24 0559 01/07/24 0547 01/08/24 0431 01/09/24 0449  NA 140 141 143 138 138  K 3.9 3.7 3.4* 3.3* 4.4  CL 106 106 108 104 105  CO2 22 25 27 24 24   GLUCOSE 246* 189* 51* 148* 364*  BUN 12 13 11 14 21   CREATININE 0.76 0.81 0.82 0.68 0.80  CALCIUM  10.6* 10.6* 10.4* 10.3 10.2   Liver Function Tests: Recent Labs  Lab 01/05/24 0606 01/06/24 0559 01/07/24 0547 01/08/24 0431 01/09/24 0449  AST 66* 60* 58* 65* 59*  ALT 43 40 41 44 42  ALKPHOS 127* 128* 126 131* 123  BILITOT 0.9 0.9 0.9 1.0 1.0  PROT 5.9* 6.1* 6.2* 6.2* 6.3*  ALBUMIN  2.8* 2.9* 2.9* 2.9* 2.9*   No results for input(s): "LIPASE", "AMYLASE" in the last 168 hours. Recent Labs  Lab 01/05/24 0606 01/06/24 0559  AMMONIA 56* 61*   CBC: Recent Labs  Lab 01/05/24 0606 01/06/24 0559 01/07/24 0547 01/08/24 0431 01/09/24 0449  WBC 5.3 6.8 8.6 5.2 5.2  NEUTROABS 2.7 3.9 6.2 3.1 2.9  HGB 11.0* 10.6* 10.8* 10.3* 11.1*  HCT 33.5* 32.4* 33.2* 30.9* 33.5*  MCV 106.7* 109.1* 107.4* 105.8* 105.0*  PLT 125* 115* 129* 110* 122*   Cardiac Enzymes: No results for input(s): "CKTOTAL", "CKMB", "CKMBINDEX", "TROPONINI" in the last 168 hours. BNP: Invalid input(s):  "POCBNP" CBG: Recent Labs  Lab 01/10/24 1124 01/10/24 1624 01/10/24 2221 01/11/24 0827 01/11/24 1205  GLUCAP 238* 297* 257* 178* 320*   D-Dimer No results for input(s): "DDIMER" in the last 72 hours. Hgb A1c No results for input(s): "HGBA1C" in the last 72 hours. Lipid Profile No results for input(s): "CHOL", "HDL", "LDLCALC", "TRIG", "CHOLHDL", "LDLDIRECT" in the last 72 hours. Thyroid  function studies No results for input(s): "TSH", "T4TOTAL", "T3FREE", "THYROIDAB" in the last 72 hours.  Invalid input(s): "FREET3" Anemia work up No results for input(s): "VITAMINB12", "FOLATE", "FERRITIN", "TIBC", "IRON ", "RETICCTPCT" in the last 72 hours. Urinalysis    Component Value Date/Time   COLORURINE YELLOW 12/08/2023 1733   APPEARANCEUR HAZY (A) 12/08/2023 1733   APPEARANCEUR Clear 07/05/2021 1129   LABSPEC 1.019 12/08/2023 1733   PHURINE 7.0 12/08/2023 1733   GLUCOSEU NEGATIVE 12/08/2023 1733   GLUCOSEU NEGATIVE 04/22/2021 1048   HGBUR NEGATIVE 12/08/2023 1733   BILIRUBINUR NEGATIVE 12/08/2023 1733   BILIRUBINUR Negative 07/05/2021 1129   KETONESUR NEGATIVE 12/08/2023 1733   PROTEINUR NEGATIVE 12/08/2023 1733   UROBILINOGEN 0.2 04/22/2021 1048   NITRITE NEGATIVE 12/08/2023 1733   LEUKOCYTESUR NEGATIVE 12/08/2023 1733   Sepsis Labs Recent Labs  Lab 01/06/24 0559 01/07/24 0547 01/08/24 0431 01/09/24 0449  WBC 6.8 8.6 5.2 5.2   Microbiology No results found for this or any previous visit (from the past 240 hours).   Time coordinating discharge: Over 30 minutes  SIGNED:   Seena Dadds, MD  [facility] 01/11/2024, 1:52 PM Pager   If 7PM-7AM, please contact night-coverage [email or web address]

## 2024-01-11 NOTE — TOC Progression Note (Signed)
 Transition of Care [facility]) - Progression Note    Patient Details  Name: Tony Cantu MRN: [id number] Date of Birth: 02-01-1951  Transition of Care [facility]) CM/SW Contact  Jannice Mends, LCSW Phone Number: 01/11/2024, 2:52 PM  Clinical Narrative:    11am-[facility] completing paperwork and payment with [name] at 1pm. They will let CSW know once bed is available.  2:53 PM-[facility] ready for patient. CSW updated [name], [place], and will arrange for PTAR.    Expected Discharge Plan: Skilled Nursing Facility Barriers to Discharge: Barriers Resolved  Expected Discharge Plan and Services In-house Referral: Clinical Social Work   Post Acute Care Choice: Skilled Nursing Facility Living arrangements for the past 2 months: Single Family Home, Skilled Nursing Facility Expected Discharge Date: 01/11/24                                     Social Determinants of Health (SDOH) Interventions SDOH Screenings   Food Insecurity: Patient Unable To Answer (12/09/2023)  Housing: Patient Unable To Answer (12/09/2023)  Recent Concern: Housing - High Risk (10/30/2023)  Transportation Needs: Patient Unable To Answer (12/09/2023)  Utilities: Patient Unable To Answer (12/09/2023)  Depression (PHQ2-9): Medium Risk (09/13/2023)  Financial Resource Strain: Low Risk  (06/09/2022)  Social Connections: Patient Unable To Answer (12/09/2023)  Recent Concern: Social Connections - Socially Isolated (10/30/2023)  Tobacco Use: Medium Risk (12/08/2023)    Readmission Risk Interventions     No data to display

## 2024-01-15 DIAGNOSIS — F4321 Adjustment disorder with depressed mood: Secondary | ICD-10-CM | POA: Diagnosis not present

## 2024-01-16 ENCOUNTER — Ambulatory Visit: Admitting: Nurse Practitioner

## 2024-01-17 ENCOUNTER — Ambulatory Visit: Admitting: Nurse Practitioner

## 2024-01-17 DIAGNOSIS — L89153 Pressure ulcer of sacral region, stage 3: Secondary | ICD-10-CM | POA: Diagnosis not present

## 2024-01-17 DIAGNOSIS — F331 Major depressive disorder, recurrent, moderate: Secondary | ICD-10-CM | POA: Diagnosis not present

## 2024-01-17 DIAGNOSIS — R159 Full incontinence of feces: Secondary | ICD-10-CM | POA: Diagnosis not present

## 2024-01-17 DIAGNOSIS — M6281 Muscle weakness (generalized): Secondary | ICD-10-CM | POA: Diagnosis not present

## 2024-01-19 ENCOUNTER — Other Ambulatory Visit (HOSPITAL_COMMUNITY)
Admission: RE | Admit: 2024-01-19 | Discharge: 2024-01-19 | Disposition: A | Discharge status: Home / Self Care | Source: Other Acute Inpatient Hospital

## 2024-01-19 DIAGNOSIS — E860 Dehydration: Secondary | ICD-10-CM | POA: Insufficient documentation

## 2024-01-19 LAB — COMPREHENSIVE METABOLIC PANEL WITH GFR
ALT: 23 U/L (ref 0–44)
AST: 32 U/L (ref 15–41)
Albumin: 2.8 g/dL — ABNORMAL LOW (ref 3.5–5.0)
Alkaline Phosphatase: 101 U/L (ref 38–126)
Anion gap: 7 (ref 5–15)
BUN: 8 mg/dL (ref 8–23)
CO2: 25 mmol/L (ref 22–32)
Calcium: 9.5 mg/dL (ref 8.9–10.3)
Chloride: 105 mmol/L (ref 98–111)
Creatinine, Ser: 0.79 mg/dL (ref 0.61–1.24)
GFR, Estimated: >60 mL/min (ref >60)
Glucose, Bld: 397 mg/dL — ABNORMAL HIGH (ref 70–99)
Potassium: 2.2 mmol/L — CL (ref 3.5–5.1)
Sodium: 137 mmol/L (ref 135–145)
Total Bilirubin: 1 mg/dL (ref 0.0–1.2)
Total Protein: 6.1 g/dL — ABNORMAL LOW (ref 6.5–8.1)

## 2024-01-19 LAB — CBC WITH DIFFERENTIAL/PLATELET
Abs Immature Granulocytes: 0.02 10*3/uL (ref 0.00–0.07)
Basophils Absolute: 0 10*3/uL (ref 0.0–0.1)
Basophils Relative: 1 %
Eosinophils Absolute: 0.1 10*3/uL (ref 0.0–0.5)
Eosinophils Relative: 2 %
HCT: 33.7 % — ABNORMAL LOW (ref 39.0–52.0)
Hemoglobin: 10.9 g/dL — ABNORMAL LOW (ref 13.0–17.0)
Immature Granulocytes: 0 %
Lymphocytes Relative: 16 %
Lymphs Abs: 0.9 10*3/uL (ref 0.7–4.0)
MCH: 34.2 pg — ABNORMAL HIGH (ref 26.0–34.0)
MCHC: 32.3 g/dL (ref 30.0–36.0)
MCV: 105.6 fL — ABNORMAL HIGH (ref 80.0–100.0)
Monocytes Absolute: 0.5 10*3/uL (ref 0.1–1.0)
Monocytes Relative: 9 %
Neutro Abs: 4.2 10*3/uL (ref 1.7–7.7)
Neutrophils Relative %: 72 %
Platelets: 141 10*3/uL — ABNORMAL LOW (ref 150–400)
RBC: 3.19 MIL/uL — ABNORMAL LOW (ref 4.22–5.81)
RDW: 15.4 % (ref 11.5–15.5)
WBC: 5.8 10*3/uL (ref 4.0–10.5)
nRBC: 0 % (ref 0.0–0.2)

## 2024-01-24 ENCOUNTER — Other Ambulatory Visit (HOSPITAL_COMMUNITY)
Admission: RE | Admit: 2024-01-24 | Discharge: 2024-01-24 | Disposition: A | Discharge status: Home / Self Care | Source: Skilled Nursing Facility

## 2024-01-24 DIAGNOSIS — K7682 Hepatic encephalopathy: Secondary | ICD-10-CM | POA: Insufficient documentation

## 2024-01-24 DIAGNOSIS — F331 Major depressive disorder, recurrent, moderate: Secondary | ICD-10-CM | POA: Diagnosis not present

## 2024-01-24 DIAGNOSIS — M6281 Muscle weakness (generalized): Secondary | ICD-10-CM | POA: Diagnosis not present

## 2024-01-24 DIAGNOSIS — L89153 Pressure ulcer of sacral region, stage 3: Secondary | ICD-10-CM | POA: Diagnosis not present

## 2024-01-24 DIAGNOSIS — R159 Full incontinence of feces: Secondary | ICD-10-CM | POA: Diagnosis not present

## 2024-01-24 LAB — BASIC METABOLIC PANEL WITH GFR
Anion gap: 11 (ref 5–15)
BUN: 12 mg/dL (ref 8–23)
CO2: 25 mmol/L (ref 22–32)
Calcium: 9.8 mg/dL (ref 8.9–10.3)
Chloride: 104 mmol/L (ref 98–111)
Creatinine, Ser: 0.88 mg/dL (ref 0.61–1.24)
GFR, Estimated: >60 mL/min (ref >60)
Glucose, Bld: 331 mg/dL — ABNORMAL HIGH (ref 70–99)
Potassium: 2.4 mmol/L — CL (ref 3.5–5.1)
Sodium: 140 mmol/L (ref 135–145)

## 2024-01-24 LAB — CBC WITH DIFFERENTIAL/PLATELET
Abs Immature Granulocytes: 0.01 10*3/uL (ref 0.00–0.07)
Basophils Absolute: 0.1 10*3/uL (ref 0.0–0.1)
Basophils Relative: 1 %
Eosinophils Absolute: 0.1 10*3/uL (ref 0.0–0.5)
Eosinophils Relative: 2 %
HCT: 37.9 % — ABNORMAL LOW (ref 39.0–52.0)
Hemoglobin: 12.7 g/dL — ABNORMAL LOW (ref 13.0–17.0)
Immature Granulocytes: 0 %
Lymphocytes Relative: 26 %
Lymphs Abs: 1.6 10*3/uL (ref 0.7–4.0)
MCH: 37.1 pg — ABNORMAL HIGH (ref 26.0–34.0)
MCHC: 33.5 g/dL (ref 30.0–36.0)
MCV: 110.8 fL — ABNORMAL HIGH (ref 80.0–100.0)
Monocytes Absolute: 0.9 10*3/uL (ref 0.1–1.0)
Monocytes Relative: 14 %
Neutro Abs: 3.5 10*3/uL (ref 1.7–7.7)
Neutrophils Relative %: 57 %
Platelets: 167 10*3/uL (ref 150–400)
RBC: 3.42 MIL/uL — ABNORMAL LOW (ref 4.22–5.81)
RDW: 16.3 % — ABNORMAL HIGH (ref 11.5–15.5)
WBC: 6.2 10*3/uL (ref 4.0–10.5)
nRBC: 0.7 % — ABNORMAL HIGH (ref 0.0–0.2)

## 2024-01-25 ENCOUNTER — Other Ambulatory Visit (HOSPITAL_COMMUNITY)
Admission: RE | Admit: 2024-01-25 | Discharge: 2024-01-25 | Disposition: A | Discharge status: Home / Self Care | Source: Skilled Nursing Facility

## 2024-01-25 DIAGNOSIS — K7682 Hepatic encephalopathy: Secondary | ICD-10-CM | POA: Diagnosis not present

## 2024-01-25 LAB — POTASSIUM: Potassium: 3.3 mmol/L — ABNORMAL LOW (ref 3.5–5.1)

## 2024-01-26 ENCOUNTER — Other Ambulatory Visit (HOSPITAL_COMMUNITY)
Admission: RE | Admit: 2024-01-26 | Discharge: 2024-01-26 | Disposition: A | Discharge status: Home / Self Care | Source: Skilled Nursing Facility

## 2024-01-26 DIAGNOSIS — K7682 Hepatic encephalopathy: Secondary | ICD-10-CM | POA: Diagnosis present

## 2024-01-26 LAB — URINALYSIS, W/ REFLEX TO CULTURE (INFECTION SUSPECTED)
Bilirubin Urine: NEGATIVE
Glucose, UA: >=500 mg/dL — AB
Ketones, ur: NEGATIVE mg/dL
Nitrite: NEGATIVE
Protein, ur: NEGATIVE mg/dL
Specific Gravity, Urine: 1.011 (ref 1.005–1.030)
pH: 5 (ref 5.0–8.0)

## 2024-01-26 LAB — CBC WITH DIFFERENTIAL/PLATELET
Abs Immature Granulocytes: 0.02 10*3/uL (ref 0.00–0.07)
Basophils Absolute: 0.1 10*3/uL (ref 0.0–0.1)
Basophils Relative: 1 %
Eosinophils Absolute: 0.1 10*3/uL (ref 0.0–0.5)
Eosinophils Relative: 1 %
HCT: 37.4 % — ABNORMAL LOW (ref 39.0–52.0)
Hemoglobin: 11.7 g/dL — ABNORMAL LOW (ref 13.0–17.0)
Immature Granulocytes: 0 %
Lymphocytes Relative: 25 %
Lymphs Abs: 1.2 10*3/uL (ref 0.7–4.0)
MCH: 34.7 pg — ABNORMAL HIGH (ref 26.0–34.0)
MCHC: 31.3 g/dL (ref 30.0–36.0)
MCV: 111 fL — ABNORMAL HIGH (ref 80.0–100.0)
Monocytes Absolute: 0.5 10*3/uL (ref 0.1–1.0)
Monocytes Relative: 11 %
Neutro Abs: 3.1 10*3/uL (ref 1.7–7.7)
Neutrophils Relative %: 62 %
Platelets: 118 10*3/uL — ABNORMAL LOW (ref 150–400)
RBC: 3.37 MIL/uL — ABNORMAL LOW (ref 4.22–5.81)
RDW: 16.3 % — ABNORMAL HIGH (ref 11.5–15.5)
WBC: 5 10*3/uL (ref 4.0–10.5)
nRBC: 0 % (ref 0.0–0.2)

## 2024-01-26 LAB — COMPREHENSIVE METABOLIC PANEL WITH GFR
ALT: 28 U/L (ref 0–44)
AST: 44 U/L — ABNORMAL HIGH (ref 15–41)
Albumin: 2.7 g/dL — ABNORMAL LOW (ref 3.5–5.0)
Alkaline Phosphatase: 109 U/L (ref 38–126)
Anion gap: 11 (ref 5–15)
BUN: 14 mg/dL (ref 8–23)
CO2: 22 mmol/L (ref 22–32)
Calcium: 9.9 mg/dL (ref 8.9–10.3)
Chloride: 109 mmol/L (ref 98–111)
Creatinine, Ser: 0.92 mg/dL (ref 0.61–1.24)
GFR, Estimated: >60 mL/min (ref >60)
Glucose, Bld: 433 mg/dL — ABNORMAL HIGH (ref 70–99)
Potassium: 3.1 mmol/L — ABNORMAL LOW (ref 3.5–5.1)
Sodium: 142 mmol/L (ref 135–145)
Total Bilirubin: 1.4 mg/dL — ABNORMAL HIGH (ref 0.0–1.2)
Total Protein: 6.3 g/dL — ABNORMAL LOW (ref 6.5–8.1)

## 2024-01-28 LAB — URINE CULTURE: Culture: >=100000 — AB

## 2024-01-31 ENCOUNTER — Emergency Department (HOSPITAL_COMMUNITY)

## 2024-01-31 ENCOUNTER — Inpatient Hospital Stay (HOSPITAL_COMMUNITY)

## 2024-01-31 ENCOUNTER — Inpatient Hospital Stay (HOSPITAL_COMMUNITY)
Admission: EM | Admit: 2024-01-31 | Discharge: 2024-02-09 | DRG: 441 | Disposition: A | Discharge status: Hospice | Source: Skilled Nursing Facility | Attending: Internal Medicine | Admitting: Internal Medicine

## 2024-01-31 DIAGNOSIS — K7682 Hepatic encephalopathy: Principal | ICD-10-CM | POA: Diagnosis present

## 2024-01-31 DIAGNOSIS — Z7901 Long term (current) use of anticoagulants: Secondary | ICD-10-CM

## 2024-01-31 DIAGNOSIS — F0390 Unspecified dementia without behavioral disturbance: Secondary | ICD-10-CM | POA: Diagnosis present

## 2024-01-31 DIAGNOSIS — E871 Hypo-osmolality and hyponatremia: Secondary | ICD-10-CM | POA: Diagnosis present

## 2024-01-31 DIAGNOSIS — Z794 Long term (current) use of insulin: Secondary | ICD-10-CM

## 2024-01-31 DIAGNOSIS — K766 Portal hypertension: Secondary | ICD-10-CM | POA: Diagnosis not present

## 2024-01-31 DIAGNOSIS — Z87442 Personal history of urinary calculi: Secondary | ICD-10-CM

## 2024-01-31 DIAGNOSIS — Z87891 Personal history of nicotine dependence: Secondary | ICD-10-CM

## 2024-01-31 DIAGNOSIS — I4510 Unspecified right bundle-branch block: Secondary | ICD-10-CM | POA: Diagnosis present

## 2024-01-31 DIAGNOSIS — I5032 Chronic diastolic (congestive) heart failure: Secondary | ICD-10-CM | POA: Diagnosis present

## 2024-01-31 DIAGNOSIS — Z96612 Presence of left artificial shoulder joint: Secondary | ICD-10-CM | POA: Diagnosis not present

## 2024-01-31 DIAGNOSIS — E785 Hyperlipidemia, unspecified: Secondary | ICD-10-CM | POA: Diagnosis present

## 2024-01-31 DIAGNOSIS — D696 Thrombocytopenia, unspecified: Secondary | ICD-10-CM | POA: Diagnosis not present

## 2024-01-31 DIAGNOSIS — E162 Hypoglycemia, unspecified: Secondary | ICD-10-CM

## 2024-01-31 DIAGNOSIS — Z1152 Encounter for screening for COVID-19: Secondary | ICD-10-CM | POA: Diagnosis not present

## 2024-01-31 DIAGNOSIS — D510 Vitamin B12 deficiency anemia due to intrinsic factor deficiency: Secondary | ICD-10-CM | POA: Diagnosis not present

## 2024-01-31 DIAGNOSIS — K7581 Nonalcoholic steatohepatitis (NASH): Secondary | ICD-10-CM | POA: Diagnosis not present

## 2024-01-31 DIAGNOSIS — K31811 Angiodysplasia of stomach and duodenum with bleeding: Secondary | ICD-10-CM | POA: Diagnosis not present

## 2024-01-31 DIAGNOSIS — Z825 Family history of asthma and other chronic lower respiratory diseases: Secondary | ICD-10-CM

## 2024-01-31 DIAGNOSIS — K7460 Unspecified cirrhosis of liver: Secondary | ICD-10-CM | POA: Diagnosis present

## 2024-01-31 DIAGNOSIS — Z0389 Encounter for observation for other suspected diseases and conditions ruled out: Secondary | ICD-10-CM | POA: Diagnosis not present

## 2024-01-31 DIAGNOSIS — D539 Nutritional anemia, unspecified: Secondary | ICD-10-CM | POA: Diagnosis present

## 2024-01-31 DIAGNOSIS — Z79899 Other long term (current) drug therapy: Secondary | ICD-10-CM

## 2024-01-31 DIAGNOSIS — I6782 Cerebral ischemia: Secondary | ICD-10-CM | POA: Diagnosis not present

## 2024-01-31 DIAGNOSIS — L89152 Pressure ulcer of sacral region, stage 2: Secondary | ICD-10-CM | POA: Diagnosis present

## 2024-01-31 DIAGNOSIS — Z803 Family history of malignant neoplasm of breast: Secondary | ICD-10-CM

## 2024-01-31 DIAGNOSIS — E870 Hyperosmolality and hypernatremia: Secondary | ICD-10-CM | POA: Diagnosis present

## 2024-01-31 DIAGNOSIS — I110 Hypertensive heart disease with heart failure: Secondary | ICD-10-CM | POA: Diagnosis present

## 2024-01-31 DIAGNOSIS — N400 Enlarged prostate without lower urinary tract symptoms: Secondary | ICD-10-CM | POA: Diagnosis present

## 2024-01-31 DIAGNOSIS — E860 Dehydration: Secondary | ICD-10-CM | POA: Diagnosis present

## 2024-01-31 DIAGNOSIS — Z8249 Family history of ischemic heart disease and other diseases of the circulatory system: Secondary | ICD-10-CM

## 2024-01-31 DIAGNOSIS — Z882 Allergy status to sulfonamides status: Secondary | ICD-10-CM

## 2024-01-31 DIAGNOSIS — J4489 Other specified chronic obstructive pulmonary disease: Secondary | ICD-10-CM | POA: Diagnosis present

## 2024-01-31 DIAGNOSIS — R531 Weakness: Secondary | ICD-10-CM | POA: Diagnosis not present

## 2024-01-31 DIAGNOSIS — R4189 Other symptoms and signs involving cognitive functions and awareness: Secondary | ICD-10-CM | POA: Diagnosis present

## 2024-01-31 DIAGNOSIS — Z833 Family history of diabetes mellitus: Secondary | ICD-10-CM

## 2024-01-31 DIAGNOSIS — I480 Paroxysmal atrial fibrillation: Secondary | ICD-10-CM | POA: Diagnosis present

## 2024-01-31 DIAGNOSIS — G9340 Encephalopathy, unspecified: Secondary | ICD-10-CM | POA: Diagnosis not present

## 2024-01-31 DIAGNOSIS — E876 Hypokalemia: Secondary | ICD-10-CM | POA: Diagnosis present

## 2024-01-31 DIAGNOSIS — R4587 Impulsiveness: Secondary | ICD-10-CM | POA: Diagnosis present

## 2024-01-31 DIAGNOSIS — E861 Hypovolemia: Secondary | ICD-10-CM | POA: Diagnosis present

## 2024-01-31 DIAGNOSIS — Z66 Do not resuscitate: Secondary | ICD-10-CM | POA: Diagnosis not present

## 2024-01-31 DIAGNOSIS — Z6825 Body mass index (BMI) 25.0-25.9, adult: Secondary | ICD-10-CM

## 2024-01-31 DIAGNOSIS — S2232XA Fracture of one rib, left side, initial encounter for closed fracture: Secondary | ICD-10-CM | POA: Diagnosis not present

## 2024-01-31 DIAGNOSIS — D649 Anemia, unspecified: Secondary | ICD-10-CM | POA: Diagnosis not present

## 2024-01-31 DIAGNOSIS — R9389 Abnormal findings on diagnostic imaging of other specified body structures: Secondary | ICD-10-CM | POA: Diagnosis not present

## 2024-01-31 DIAGNOSIS — G4733 Obstructive sleep apnea (adult) (pediatric): Secondary | ICD-10-CM | POA: Diagnosis present

## 2024-01-31 DIAGNOSIS — Z4682 Encounter for fitting and adjustment of non-vascular catheter: Secondary | ICD-10-CM | POA: Diagnosis not present

## 2024-01-31 DIAGNOSIS — E872 Acidosis, unspecified: Secondary | ICD-10-CM | POA: Diagnosis present

## 2024-01-31 DIAGNOSIS — Z860101 Personal history of adenomatous and serrated colon polyps: Secondary | ICD-10-CM

## 2024-01-31 DIAGNOSIS — R5383 Other fatigue: Secondary | ICD-10-CM | POA: Diagnosis present

## 2024-01-31 DIAGNOSIS — R627 Adult failure to thrive: Secondary | ICD-10-CM | POA: Diagnosis present

## 2024-01-31 DIAGNOSIS — E1165 Type 2 diabetes mellitus with hyperglycemia: Secondary | ICD-10-CM | POA: Diagnosis not present

## 2024-01-31 DIAGNOSIS — D6959 Other secondary thrombocytopenia: Secondary | ICD-10-CM | POA: Diagnosis present

## 2024-01-31 DIAGNOSIS — Z8711 Personal history of peptic ulcer disease: Secondary | ICD-10-CM

## 2024-01-31 DIAGNOSIS — G9341 Metabolic encephalopathy: Secondary | ICD-10-CM | POA: Diagnosis present

## 2024-01-31 DIAGNOSIS — Z888 Allergy status to other drugs, medicaments and biological substances status: Secondary | ICD-10-CM

## 2024-01-31 DIAGNOSIS — Z8261 Family history of arthritis: Secondary | ICD-10-CM

## 2024-01-31 DIAGNOSIS — R6510 Systemic inflammatory response syndrome (SIRS) of non-infectious origin without acute organ dysfunction: Secondary | ICD-10-CM

## 2024-01-31 DIAGNOSIS — Z515 Encounter for palliative care: Secondary | ICD-10-CM | POA: Diagnosis not present

## 2024-01-31 DIAGNOSIS — N401 Enlarged prostate with lower urinary tract symptoms: Secondary | ICD-10-CM | POA: Diagnosis not present

## 2024-01-31 DIAGNOSIS — R001 Bradycardia, unspecified: Secondary | ICD-10-CM | POA: Diagnosis not present

## 2024-01-31 DIAGNOSIS — E43 Unspecified severe protein-calorie malnutrition: Secondary | ICD-10-CM | POA: Diagnosis not present

## 2024-01-31 DIAGNOSIS — Z96653 Presence of artificial knee joint, bilateral: Secondary | ICD-10-CM | POA: Diagnosis present

## 2024-01-31 DIAGNOSIS — I9589 Other hypotension: Secondary | ICD-10-CM | POA: Diagnosis present

## 2024-01-31 DIAGNOSIS — E11649 Type 2 diabetes mellitus with hypoglycemia without coma: Secondary | ICD-10-CM | POA: Diagnosis present

## 2024-01-31 DIAGNOSIS — Z96611 Presence of right artificial shoulder joint: Secondary | ICD-10-CM | POA: Diagnosis present

## 2024-01-31 DIAGNOSIS — Z7189 Other specified counseling: Secondary | ICD-10-CM | POA: Diagnosis not present

## 2024-01-31 DIAGNOSIS — Z820 Family history of epilepsy and other diseases of the nervous system: Secondary | ICD-10-CM

## 2024-01-31 DIAGNOSIS — R404 Transient alteration of awareness: Secondary | ICD-10-CM | POA: Diagnosis not present

## 2024-01-31 DIAGNOSIS — K7290 Hepatic failure, unspecified without coma: Secondary | ICD-10-CM | POA: Diagnosis not present

## 2024-01-31 DIAGNOSIS — I10 Essential (primary) hypertension: Secondary | ICD-10-CM | POA: Diagnosis not present

## 2024-01-31 DIAGNOSIS — Z5320 Procedure and treatment not carried out because of patient's decision for unspecified reasons: Secondary | ICD-10-CM | POA: Diagnosis present

## 2024-01-31 DIAGNOSIS — I959 Hypotension, unspecified: Secondary | ICD-10-CM | POA: Diagnosis not present

## 2024-01-31 LAB — URINALYSIS, W/ REFLEX TO CULTURE (INFECTION SUSPECTED)
Bacteria, UA: NONE SEEN
Bilirubin Urine: NEGATIVE
Glucose, UA: NEGATIVE mg/dL
Ketones, ur: NEGATIVE mg/dL
Nitrite: NEGATIVE
Protein, ur: NEGATIVE mg/dL
Specific Gravity, Urine: 1.012 (ref 1.005–1.030)
pH: 7 (ref 5.0–8.0)

## 2024-01-31 LAB — CBC WITH DIFFERENTIAL/PLATELET
Abs Immature Granulocytes: 0.03 10*3/uL (ref 0.00–0.07)
Basophils Absolute: 0 10*3/uL (ref 0.0–0.1)
Basophils Relative: 1 %
Eosinophils Absolute: 0.1 10*3/uL (ref 0.0–0.5)
Eosinophils Relative: 1 %
HCT: 37.5 % — ABNORMAL LOW (ref 39.0–52.0)
Hemoglobin: 12.7 g/dL — ABNORMAL LOW (ref 13.0–17.0)
Immature Granulocytes: 0 %
Lymphocytes Relative: 28 %
Lymphs Abs: 2.1 10*3/uL (ref 0.7–4.0)
MCH: 36.1 pg — ABNORMAL HIGH (ref 26.0–34.0)
MCHC: 33.9 g/dL (ref 30.0–36.0)
MCV: 106.5 fL — ABNORMAL HIGH (ref 80.0–100.0)
Monocytes Absolute: 1.1 10*3/uL — ABNORMAL HIGH (ref 0.1–1.0)
Monocytes Relative: 15 %
Neutro Abs: 4.2 10*3/uL (ref 1.7–7.7)
Neutrophils Relative %: 55 %
Platelets: 111 10*3/uL — ABNORMAL LOW (ref 150–400)
RBC: 3.52 MIL/uL — ABNORMAL LOW (ref 4.22–5.81)
RDW: 16.8 % — ABNORMAL HIGH (ref 11.5–15.5)
WBC: 7.5 10*3/uL (ref 4.0–10.5)
nRBC: 0 % (ref 0.0–0.2)

## 2024-01-31 LAB — BLOOD GAS, VENOUS
Acid-Base Excess: 5.5 mmol/L — ABNORMAL HIGH (ref 0.0–2.0)
Bicarbonate: 31.6 mmol/L — ABNORMAL HIGH (ref 20.0–28.0)
Drawn by: 1528
O2 Saturation: 14.6 %
Patient temperature: 37.5
pCO2, Ven: 52 mmHg (ref 44–60)
pH, Ven: 7.39 (ref 7.25–7.43)
pO2, Ven: <31 mmHg — CL (ref 32–45)

## 2024-01-31 LAB — AMMONIA: Ammonia: 100 umol/L — ABNORMAL HIGH (ref 9–35)

## 2024-01-31 LAB — COMPREHENSIVE METABOLIC PANEL WITH GFR
ALT: 35 U/L (ref 0–44)
AST: 53 U/L — ABNORMAL HIGH (ref 15–41)
Albumin: 2.7 g/dL — ABNORMAL LOW (ref 3.5–5.0)
Alkaline Phosphatase: 113 U/L (ref 38–126)
Anion gap: 11 (ref 5–15)
BUN: 17 mg/dL (ref 8–23)
CO2: 28 mmol/L (ref 22–32)
Calcium: 10.8 mg/dL — ABNORMAL HIGH (ref 8.9–10.3)
Chloride: 113 mmol/L — ABNORMAL HIGH (ref 98–111)
Creatinine, Ser: 0.93 mg/dL (ref 0.61–1.24)
GFR, Estimated: >60 mL/min (ref >60)
Glucose, Bld: 62 mg/dL — ABNORMAL LOW (ref 70–99)
Potassium: 2.2 mmol/L — CL (ref 3.5–5.1)
Sodium: 152 mmol/L — ABNORMAL HIGH (ref 135–145)
Total Bilirubin: 1.2 mg/dL (ref 0.0–1.2)
Total Protein: 6.4 g/dL — ABNORMAL LOW (ref 6.5–8.1)

## 2024-01-31 LAB — RESP PANEL BY RT-PCR (RSV, FLU A&B, COVID)  RVPGX2
Influenza A by PCR: NEGATIVE
Influenza B by PCR: NEGATIVE
Resp Syncytial Virus by PCR: NEGATIVE
SARS Coronavirus 2 by RT PCR: NEGATIVE

## 2024-01-31 LAB — CBG MONITORING, ED
Glucose-Capillary: 58 mg/dL — ABNORMAL LOW (ref 70–99)
Glucose-Capillary: 205 mg/dL — ABNORMAL HIGH (ref 70–99)

## 2024-01-31 LAB — LACTIC ACID, PLASMA
Lactic Acid, Venous: 2.3 mmol/L (ref 0.5–1.9)
Lactic Acid, Venous: 4.3 mmol/L (ref 0.5–1.9)

## 2024-01-31 LAB — BETA-HYDROXYBUTYRIC ACID: Beta-Hydroxybutyric Acid: 0.25 mmol/L (ref 0.05–0.27)

## 2024-01-31 LAB — PROTIME-INR
INR: 1.5 — ABNORMAL HIGH (ref 0.8–1.2)
Prothrombin Time: 18.1 s — ABNORMAL HIGH (ref 11.4–15.2)

## 2024-01-31 LAB — MAGNESIUM: Magnesium: 2.5 mg/dL — ABNORMAL HIGH (ref 1.7–2.4)

## 2024-01-31 MED ORDER — DEXTROSE 5 % IV SOLN: INTRAVENOUS | Status: DC

## 2024-01-31 MED ORDER — LACTATED RINGERS IV BOLUS (SEPSIS)
1000.0000 mL | Freq: Once | INTRAVENOUS | Status: AC
  Administered 2024-01-31: 1000 mL via INTRAVENOUS

## 2024-01-31 MED ORDER — SODIUM CHLORIDE 0.9% FLUSH
3.0000 mL | Freq: Two times a day (BID) | INTRAVENOUS | Status: DC
  Administered 2024-01-31 – 2024-02-09 (×17): 3 mL via INTRAVENOUS

## 2024-01-31 MED ORDER — ACETAMINOPHEN 500 MG PO TABS: 500.0000 mg | ORAL_TABLET | Freq: Four times a day (QID) | ORAL | Status: DC | PRN

## 2024-01-31 MED ORDER — MELATONIN 3 MG PO TABS: 6.0000 mg | ORAL_TABLET | Freq: Every day | ORAL | Status: DC

## 2024-01-31 MED ORDER — POTASSIUM CHLORIDE 10 MEQ/100ML IV SOLN
10.0000 meq | INTRAVENOUS | Status: AC
  Administered 2024-02-01 (×3): 10 meq via INTRAVENOUS
  Filled 2024-01-31 (×3): qty 100

## 2024-01-31 MED ORDER — INSULIN ASPART 100 UNIT/ML IJ SOLN
0.0000 [IU] | Freq: Three times a day (TID) | INTRAMUSCULAR | Status: DC
  Administered 2024-02-01: 5 [IU] via SUBCUTANEOUS
  Administered 2024-02-01 (×2): 4 [IU] via SUBCUTANEOUS

## 2024-01-31 MED ORDER — THIAMINE MONONITRATE 100 MG PO TABS: 100.0000 mg | ORAL_TABLET | Freq: Every day | ORAL | Status: DC

## 2024-01-31 MED ORDER — ADULT MULTIVITAMIN W/MINERALS CH: 1.0000 | ORAL_TABLET | Freq: Every day | ORAL | Status: DC

## 2024-01-31 MED ORDER — DEXTROSE 50 % IV SOLN
1.0000 | Freq: Once | INTRAVENOUS | Status: AC
  Administered 2024-01-31: 50 mL via INTRAVENOUS
  Filled 2024-01-31: qty 50

## 2024-01-31 MED ORDER — LACTULOSE 10 GM/15ML PO SOLN
30.0000 g | Freq: Four times a day (QID) | ORAL | Status: DC
  Administered 2024-02-01 – 2024-02-02 (×6): 30 g
  Filled 2024-01-31 (×6): qty 60

## 2024-01-31 MED ORDER — VANCOMYCIN HCL IN DEXTROSE 1-5 GM/200ML-% IV SOLN
1000.0000 mg | Freq: Once | INTRAVENOUS | Status: AC
Start: 2024-01-31 — End: 2024-02-01
  Administered 2024-01-31: 1000 mg via INTRAVENOUS
  Filled 2024-01-31: qty 200

## 2024-01-31 MED ORDER — LACTATED RINGERS IV BOLUS (SEPSIS)
500.0000 mL | Freq: Once | INTRAVENOUS | Status: AC
  Administered 2024-01-31: 500 mL via INTRAVENOUS

## 2024-01-31 MED ORDER — ALBUTEROL SULFATE (2.5 MG/3ML) 0.083% IN NEBU: 2.5000 mg | INHALATION_SOLUTION | RESPIRATORY_TRACT | Status: DC | PRN

## 2024-01-31 MED ORDER — LACTATED RINGERS IV SOLN: INTRAVENOUS | Status: DC

## 2024-01-31 MED ORDER — THIAMINE HCL 100 MG/ML IJ SOLN
100.0000 mg | Freq: Once | INTRAMUSCULAR | Status: AC
  Administered 2024-01-31: 100 mg via INTRAVENOUS
  Filled 2024-01-31: qty 2

## 2024-01-31 MED ORDER — AMIODARONE HCL 200 MG PO TABS
100.0000 mg | ORAL_TABLET | Freq: Every day | ORAL | Status: DC
  Administered 2024-02-01 – 2024-02-04 (×4): 100 mg
  Filled 2024-01-31 (×4): qty 1

## 2024-01-31 MED ORDER — ADULT MULTIVITAMIN W/MINERALS CH
1.0000 | ORAL_TABLET | Freq: Every day | ORAL | Status: DC
  Administered 2024-02-01 – 2024-02-04 (×4): 1
  Filled 2024-01-31 (×4): qty 1

## 2024-01-31 MED ORDER — POTASSIUM CHLORIDE 20 MEQ PO PACK
40.0000 meq | PACK | Freq: Once | ORAL | Status: AC
  Administered 2024-01-31: 40 meq
  Filled 2024-01-31: qty 2

## 2024-01-31 MED ORDER — SODIUM CHLORIDE 0.9 % IV SOLN
2.0000 g | Freq: Once | INTRAVENOUS | Status: AC
  Administered 2024-01-31: 2 g via INTRAVENOUS
  Filled 2024-01-31: qty 12.5

## 2024-01-31 MED ORDER — LACTULOSE 10 GM/15ML PO SOLN
30.0000 g | Freq: Once | ORAL | Status: AC
  Administered 2024-01-31: 30 g
  Filled 2024-01-31: qty 60

## 2024-01-31 MED ORDER — POTASSIUM CHLORIDE CRYS ER 20 MEQ PO TBCR: 40.0000 meq | EXTENDED_RELEASE_TABLET | Freq: Once | ORAL | Status: DC

## 2024-01-31 MED ORDER — ENSURE PLUS HIGH PROTEIN PO LIQD
237.0000 mL | Freq: Two times a day (BID) | ORAL | Status: DC
  Filled 2024-01-31 (×2): qty 237

## 2024-01-31 MED ORDER — APIXABAN 5 MG PO TABS: 5.0000 mg | ORAL_TABLET | Freq: Two times a day (BID) | ORAL | Status: DC

## 2024-01-31 MED ORDER — MELATONIN 3 MG PO TABS
6.0000 mg | ORAL_TABLET | Freq: Every day | ORAL | Status: DC
  Administered 2024-02-01 – 2024-02-03 (×3): 6 mg
  Filled 2024-01-31 (×3): qty 2

## 2024-01-31 MED ORDER — MIDODRINE HCL 5 MG PO TABS
10.0000 mg | ORAL_TABLET | Freq: Three times a day (TID) | ORAL | Status: DC
  Administered 2024-02-01 – 2024-02-04 (×10): 10 mg
  Filled 2024-01-31 (×10): qty 2

## 2024-01-31 MED ORDER — POTASSIUM CHLORIDE 10 MEQ/100ML IV SOLN
10.0000 meq | INTRAVENOUS | Status: AC
  Administered 2024-01-31 – 2024-02-01 (×3): 10 meq via INTRAVENOUS
  Filled 2024-01-31 (×3): qty 100

## 2024-01-31 MED ORDER — POLYETHYLENE GLYCOL 3350 17 G PO PACK: 17.0000 g | PACK | Freq: Every day | ORAL | Status: DC | PRN

## 2024-01-31 MED ORDER — PANTOPRAZOLE SODIUM 40 MG IV SOLR
40.0000 mg | Freq: Two times a day (BID) | INTRAVENOUS | Status: DC
  Administered 2024-01-31 – 2024-02-03 (×6): 40 mg via INTRAVENOUS
  Filled 2024-01-31 (×6): qty 10

## 2024-01-31 MED ORDER — METRONIDAZOLE 500 MG/100ML IV SOLN
500.0000 mg | Freq: Once | INTRAVENOUS | Status: AC
  Administered 2024-01-31: 500 mg via INTRAVENOUS
  Filled 2024-01-31: qty 100

## 2024-01-31 MED ORDER — CHLORHEXIDINE GLUCONATE CLOTH 2 % EX PADS
6.0000 | MEDICATED_PAD | Freq: Every day | CUTANEOUS | Status: DC
  Administered 2024-02-01 – 2024-02-07 (×7): 6 via TOPICAL

## 2024-01-31 MED ORDER — THIAMINE MONONITRATE 100 MG PO TABS
100.0000 mg | ORAL_TABLET | Freq: Every day | ORAL | Status: DC
  Administered 2024-02-01 – 2024-02-04 (×4): 100 mg
  Filled 2024-01-31 (×4): qty 1

## 2024-01-31 MED ORDER — MONTELUKAST SODIUM 10 MG PO TABS
10.0000 mg | ORAL_TABLET | Freq: Every day | ORAL | Status: DC
  Administered 2024-02-01 – 2024-02-03 (×3): 10 mg
  Filled 2024-01-31 (×3): qty 1

## 2024-01-31 MED ORDER — FOLIC ACID 1 MG PO TABS
1.0000 mg | ORAL_TABLET | Freq: Every day | ORAL | Status: DC
  Administered 2024-02-01 – 2024-02-06 (×6): 1 mg via ORAL
  Filled 2024-01-31 (×7): qty 1

## 2024-01-31 MED ORDER — RIFAXIMIN 550 MG PO TABS
550.0000 mg | ORAL_TABLET | Freq: Two times a day (BID) | ORAL | Status: DC
  Administered 2024-01-31 – 2024-02-04 (×8): 550 mg
  Filled 2024-01-31 (×8): qty 1

## 2024-01-31 NOTE — H&P (Incomplete)
 History and Physical    Tony Cantu:[id number] DOB: [birth year] DOA: 01/31/2024  PCP: Tony Guerin, DO   Patient coming from: SNF - [facility]    Chief Complaint:  Chief Complaint  Patient presents with   Abnormal Labs     HPI: History limited by his altered mental status Tony Cantu is a 73 y.o. male with hx of decompensated Nash cirrhosis, HFpEF, paroxysmal A-fib on anticoagulation, diabetes, hyperlipidemia, COPD/asthma, OSA, BPH with chronic Foley, question of underlying dementia, failure to thrive, recent admission 4/11 - 5/15 with hepatic encephalopathy and GI bleeding found to have portal hypertensive gastropathy and gastric and duodenal AVM s/p APC.  Was brought in from his SNF at [facility] for increasing lethargy and altered mental status.  History is limited, per triage notes decreased p.o. times few days and increasing lethargy.  Facility did lab work with ammonia level of 113 so sent to the ED.  Discussed with his ex wife and POA Tony Cantu who was notified by SNF of his status and plan for hospitalization. She notes concern about ability to take in PO and limited access to food / water  and him not requesting from staff. Also reports having possible dysphagia / things getting "hung up" when he swallows. Concern about patient refusal of meds and impaired capacity during periods of encephalopathy.    Review of Systems:  ROS unable to complete due to altered mental status  Allergies  Allergen Reactions   Ak-Mycin [Erythromycin] Diarrhea   Dimetapp Children's Cold-Cough Other (See Comments)    Chest discomfort    Sulfonamide Derivatives Diarrhea    Prior to Admission medications   Medication Sig Start Date End Date Taking? Authorizing Provider  acetaminophen  (TYLENOL ) 500 MG tablet Take 1,000 mg by mouth every 6 (six) hours as needed for moderate pain.    [provider]  amiodarone  (PACERONE ) 100 MG tablet Take 1 tablet (100 mg total) by mouth  daily. 01/12/24   Elgergawy, Ardia Kraft, MD  apixaban  (ELIQUIS ) 5 MG TABS tablet Take 1 tablet (5 mg total) by mouth 2 (two) times daily. 06/08/23   Tony Guerin, DO  atorvastatin  (LIPITOR) 40 MG tablet Take 1 tablet (40 mg total) by mouth daily. Patient taking differently: Take 40 mg by mouth daily at 6 PM. 06/08/23   Vicky Grange M, DO  b complex vitamins tablet Take 1 tablet by mouth daily.    [provider]  cefTRIAXone  (ROCEPHIN ) 1 g injection Inject 1 g into the muscle daily. 01/27/24   [provider]  dextromethorphan-guaiFENesin (ROBITUSSIN-DM) 10-100 MG/5ML liquid Take 10 mLs by mouth in the morning and at bedtime.    [provider]  ergocalciferol  (VITAMIN D2) 1.25 MG (50000 UT) capsule Take 50,000 Units by mouth See admin instructions. Take 1 capsule (50,000 units) by mouth twice weekly on Wednesday and Sunday.    [provider]  feeding supplement, GLUCERNA SHAKE, (GLUCERNA SHAKE) LIQD Take 237 mLs by mouth 3 (three) times daily between meals. 01/11/24   Elgergawy, Ardia Kraft, MD  ferrous sulfate  325 (65 FE) MG EC tablet Take 1 tablet (325 mg total) by mouth daily with breakfast. 01/18/23   Sonnie Dusky, PA-C  Glucagon , rDNA, (GLUCAGON  EMERGENCY) 1 MG KIT INJECT AS DIRECTED INTO  UPPER ARM, THIGH OR  BUTTOCKS AS NEEDED FOR  SEVERE HYPOGLYCEMIA. SEEK  MEDICAL ATTENTION AFTER USE 06/14/21   Zorita Hiss, FNP  Glucerna [facility]) LIQD Take 120-237 mLs by mouth See admin  instructions. Drink 1 bottle 3 times daily, after meals at 1000, 1300 and 1800. If BS < 60, provide of Glucerna and recheck blood sugar in 15 minutes.    [provider]  insulin  glargine-yfgn (SEMGLEE ) 100 UNIT/ML injection Inject 0.25 mLs (25 Units total) into the skin every morning. 01/12/24   Elgergawy, Ardia Kraft, MD  insulin  glargine-yfgn (SEMGLEE ) 100 UNIT/ML injection Inject 0.08 mLs (8 Units total) into the skin at bedtime. 01/11/24   Elgergawy, Ardia Kraft, MD  ipratropium-albuterol  (DUONEB) 0.5-2.5 (3) MG/3ML SOLN Take 3 mLs by nebulization every 6 (six) hours as needed. Patient taking differently: Take 3 mLs by nebulization every 6 (six) hours as needed (wheezing, SOB). 12/21/23   Ghimire, Estil Heman, MD  lactulose  (CHRONULAC ) 10 GM/15ML solution Take 45 mLs (30 g total) by mouth 3 (three) times daily. 12/21/23   Ghimire, Estil Heman, MD  LANTUS  SOLOSTAR 100 UNIT/ML Solostar Pen Inject into the skin. 01/28/24   [provider]  magnesium  oxide (MAG-OX) 400 (240 Mg) MG tablet Take 400 mg by mouth daily.    [provider]  melatonin 3 MG TABS tablet Take 1 tablet (3 mg total) by mouth at bedtime as needed. 12/21/23   Ghimire, Estil Heman, MD  midodrine  (PROAMATINE ) 10 MG tablet Take 1 tablet (10 mg total) by mouth 3 (three) times daily with meals. 12/21/23   Ghimire, Estil Heman, MD  mirtazapine  (REMERON  SOL-TAB) 15 MG disintegrating tablet Take 1 tablet (15 mg total) by mouth at bedtime. 01/11/24   Elgergawy, Ardia Kraft, MD  montelukast  (SINGULAIR ) 10 MG tablet Take 1 tablet (10 mg total) by mouth at bedtime. 06/08/23   Tony Guerin, DO  Multiple Vitamins-Minerals (MULTIVITAMIN WITH MINERALS) tablet Take 1 tablet by mouth daily.    [provider]  NOVOLOG  FLEXPEN 100 UNIT/ML FlexPen Inject 0-9 Units into the skin 3 (three) times daily with meals. CBG 70 - 120: 0 units  CBG 121 - 150: 1 unit  CBG 151 - 200: 2 units  CBG 201 - 250: 3 units  CBG 251 - 300: 5 units  CBG 301 - 350: 7 units  CBG 351 - 400: 9 units 01/28/24   [provider]  nutrition supplement, JUVEN, (JUVEN) PACK Take 1 packet by mouth 2 (two) times daily at 8 am and 10 pm. 01/11/24   Elgergawy, Ardia Kraft, MD  pantoprazole  (PROTONIX ) 40 MG tablet TAKE 1 TABLET BY MOUTH TWICE  DAILY BEFORE MEALS 10/16/23   Pyrtle, Amber Bail, MD  Potassium Chloride  ER 20 MEQ TBCR Take by mouth. 01/24/24   [provider]  rifaximin  (XIFAXAN ) 550 MG TABS tablet Take 1  tablet (550 mg total) by mouth 2 (two) times daily. 12/21/23   Burton Casey, MD    Past Medical History:  Diagnosis Date   Acute renal injury (HCC) 08/06/2016   Aortic atherosclerosis (HCC)    Aortic stenosis    Arthritis    Asthma    BPH (benign prostatic hypertrophy)    Cirrhosis (HCC) 2016   Colon polyps    Diabetes mellitus without complication (HCC)    TYPE 1    Diverticulosis    DKA (diabetic ketoacidosis) (HCC) 10/25/2023   Dysrhythmia 04/19/2021   A-fib noted with possible A-fib RVR   Gastric ulcer    Gastric varices    right   Gastritis    GERD (gastroesophageal reflux disease)    Headache    History- pt states these were migraines that  occurred in the 1980's   Heart murmur    History of kidney stones    Hx of adenomatous colonic polyps    Hypertension    Iron  deficiency anemia due to chronic blood loss 08/16/2023   Melena 09/20/2023   Portal hypertensive gastropathy (HCC)    Postoperative fever 03/10/2022   Renal cyst, right    Seizures (HCC)    HYPOGLYCEMIC LAST 1 AND 1/2 YRS AGO   Sepsis (HCC) 09/20/2023   Sleep apnea    uses CPAP nightly   Stroke-like symptom 09/20/2023   Testicle trouble    one testicle BORN WITH   Tubular adenoma of colon     Past Surgical History:  Procedure Laterality Date   BACK SURGERY     89  LOWER    CARPAL TUNNEL RELEASE Right 10/13/2015   Procedure: RIGHT CARPAL TUNNEL RELEASE;  Surgeon: Lyanne Sample, MD;  Location: [facility];  Service: Orthopedics;  Laterality: Right;   CARPAL TUNNEL RELEASE Left 07/05/2016   Procedure: LEFT CARPAL TUNNEL RELEASE;  Surgeon: Lyanne Sample, MD;  Location: [facility];  Service: Orthopedics;  Laterality: Left;   COLONOSCOPY WITH ESOPHAGOGASTRODUODENOSCOPY (EGD)  10/05/2022   DRUG INDUCED ENDOSCOPY N/A 07/02/2021   Procedure: DRUG INDUCED SLEEP ENDOSCOPY;  Surgeon: Virgina Grills, MD;  Location: [facility] OR;  Service: ENT;  Laterality: N/A;    ESOPHAGOGASTRODUODENOSCOPY N/A 12/13/2023   Procedure: EGD (ESOPHAGOGASTRODUODENOSCOPY);  Surgeon: Daina Drum, MD;  [facility];  Service: Gastroenterology;  Laterality: N/A;   ESOPHAGOGASTRODUODENOSCOPY (EGD) WITH PROPOFOL  N/A 09/25/2023   Procedure: ESOPHAGOGASTRODUODENOSCOPY (EGD) WITH PROPOFOL ;  Surgeon: Albertina Hugger, MD;  Location: [facility] ENDOSCOPY;  Service: Gastroenterology;  Laterality: N/A;  EGD needs to be coordinated with TEE.  Patient has severe aortic stenosis, procedures need to be done to follow each other with 1 sedation   HOT HEMOSTASIS N/A 12/13/2023   Procedure: EGD, WITH ARGON PLASMA COAGULATION;  Surgeon: Daina Drum, MD;  [facility] END[facility]COPY;  Service: Gastroenterology;  Laterality: N/A;   IMPLANTATION OF HYPOGLOSSAL NERVE STIMULATOR Right 10/01/2021   Procedure: IMPLANTATION OF HYPOGLOSSAL NERVE STIMULATOR;  Surgeon: Virgina Grills, MD;  Location: [facility] OR;  Service: ENT;  Laterality: Right;   INGUINAL HERNIA REPAIR  2003   right    KNEE ARTHROSCOPY Right 03/03/2016   LUMBAR DISC SURGERY  03/1995   Dr. Burr Cary, discectomy   LUMBAR LAMINECTOMY/DECOMPRESSION MICRODISCECTOMY Right 10/06/2016   Procedure: RIGHT LUMBAR THREE - LUMBAR FOUR  LAMINECTOMY, FORAMINOTOMY AND MICRODISCECTOMY;  Surgeon: Yvonna Herder, MD;  Location: [facility] OR;  Service: Neurosurgery;  Laterality: Right;   SHOULDER SURGERY  11/28/2005   left partial   SHOULDER SURGERY  07/14/2006   RIGHT   TEE WITHOUT CARDIOVERSION N/A 09/25/2023   Procedure: TRANSESOPHAGEAL ECHOCARDIOGRAM (TEE);  Surgeon: Mardell Shade, MD;  Location: [facility] ENDOSCOPY;  Service: Cardiovascular;  Laterality: N/A;   TONSILLECTOMY  AGE 41 OR 4   TOTAL KNEE ARTHROPLASTY Right 03/07/2022   Procedure: RIGHT TOTAL KNEE ARTHROPLASTY;  Surgeon: Wendolyn Hamburger, MD;  Location: [facility];  Service: Orthopedics;  Laterality: Right;   TOTAL KNEE ARTHROPLASTY Left 06/06/2022   Procedure: LEFT TOTAL KNEE ARTHROPLASTY;  Surgeon: Wendolyn Hamburger, MD;  Location: [facility];  Service: Orthopedics;  Laterality: Left;   TOTAL SHOULDER ARTHROPLASTY Right 11/01/2018   Procedure: RIGHT SHOULDER REVISION TO REVERSE TOTAL SHOULDER;  Surgeon: Sammye Cristal, MD;  Location: [facility];  Service: Orthopedics;  Laterality: Right;  CHOICE ANESTHESIA WITH INTERSCALENE BLOCK EXPAREL ,  NEEDS RNFA     reports that he quit smoking about 20 years ago. His smoking use included cigarettes and pipe. He started smoking about 54 years ago. He has never used smokeless tobacco. He reports that he does not currently use alcohol. He reports that he does not use drugs.  Family History  Problem Relation Age of Onset   Heart failure Mother    Heart attack Mother        in her 75's   Alzheimer's disease Mother    Diabetes Mother    Asthma Father    Suicidality Father        in his 74's   Allergic rhinitis Sister    Heart failure Maternal Grandmother    Rheum arthritis Maternal Grandmother    Breast cancer Maternal Grandmother    Heart attack Maternal Grandmother        in her 51's   Colon cancer Neg Hx    Esophageal cancer Neg Hx    Pancreatic cancer Neg Hx    Liver disease Neg Hx      Physical Exam: Vitals:   01/31/24 2230 01/31/24 2300 01/31/24 2315 01/31/24 2317  BP: (!) 130/95 109/70 99/66 99/66   Pulse:  80    Resp: 18 17 12 20   Temp:      TempSrc:      SpO2:  90%      Gen: Awake, alert, chronically ill-appearing HEENT: Head atraumatic Neck: C-spine nontender CV: Regular, normal S1, S2, 2/6 SEM at RUSB Resp: Normal WOB, CTAB  Abd: Flat, normoactive, nontender MSK: LE Symmetric, no edema  Skin: Multiple ecchymoses over the arms.  No other rashes or lesions to exposed skin Neuro: Somnolent but awakens to voice then awake and alert and interactive.  Unintelligible speech.  Unable to assess orientation.  CN exam limited PERRL, no facial droop.  Motor symmetric grip, able to wiggle toes but does not lift legs antigravity.  Sensation difficult  to assessment appears symmetric and intact. Psych: Encephalopathic   Data review:   Labs reviewed, notable for:   NA 152, K2.2, bicarb 28 no anion gap, creatinine 0.9, glucose 62, calcium  10.8 AST 53, albumin  2.7 Ammonia 100 Hemoglobin 12, platelet 111, INR 1.5 UA not consistent with infection, small blood and positive hyaline cast  Micro:  Results for orders placed or performed during the hospital encounter of 01/31/24  Resp panel by RT-PCR (RSV, Flu A&B, Covid) Anterior Nasal Swab     Status: None   Collection Time: 01/31/24  9:05 PM   Specimen: Anterior Nasal Swab  Result Value Ref Range Status   SARS Coronavirus 2 by RT PCR NEGATIVE NEGATIVE Final    Comment: (NOTE) SARS-CoV-2 target nucleic acids are NOT DETECTED.  The SARS-CoV-2 RNA is generally detectable in upper respiratory specimens during the acute phase of infection. The lowest concentration of SARS-CoV-2 viral copies this assay can detect is 138 copies/mL. A negative result does not preclude SARS-Cov-2 infection and should not be used as the sole basis for treatment or other patient management decisions. A negative result may occur with  improper specimen collection/handling, submission of specimen other than nasopharyngeal swab, presence of viral mutation(s) within the areas targeted by this assay, and inadequate number of viral copies(<138 copies/mL). A negative result must be combined with clinical observations, patient history, and epidemiological information. The expected result is Negative.  Fact Sheet for Patients:  BloggerCourse.com  Fact Sheet for Healthcare Providers:  SeriousBroker.it  This test is no t yet  approved or cleared by the [place]  FDA and  has been authorized for detection and/or diagnosis of SARS-CoV-2 by FDA under an Emergency Use Authorization (EUA). This EUA will remain  in effect (meaning this test can be used) for the duration  of the COVID-19 declaration under Section 564(b)(1) of the Act, 21 U.S.C.section 360bbb-3(b)(1), unless the authorization is terminated  or revoked sooner.       Influenza A by PCR NEGATIVE NEGATIVE Final   Influenza B by PCR NEGATIVE NEGATIVE Final    Comment: (NOTE) The Xpert Xpress SARS-CoV-2/FLU/RSV plus assay is intended as an aid in the diagnosis of influenza from Nasopharyngeal swab specimens and should not be used as a sole basis for treatment. Nasal washings and aspirates are unacceptable for Xpert Xpress SARS-CoV-2/FLU/RSV testing.  Fact Sheet for Patients: BloggerCourse.com  Fact Sheet for Healthcare Providers: SeriousBroker.it  This test is not yet approved or cleared by the [place]  FDA and has been authorized for detection and/or diagnosis of SARS-CoV-2 by FDA under an Emergency Use Authorization (EUA). This EUA will remain in effect (meaning this test can be used) for the duration of the COVID-19 declaration under Section 564(b)(1) of the Act, 21 U.S.C. section 360bbb-3(b)(1), unless the authorization is terminated or revoked.     Resp Syncytial Virus by PCR NEGATIVE NEGATIVE Final    Comment: (NOTE) Fact Sheet for Patients: BloggerCourse.com  Fact Sheet for Healthcare Providers: SeriousBroker.it  This test is not yet approved or cleared by the [place]  FDA and has been authorized for detection and/or diagnosis of SARS-CoV-2 by FDA under an Emergency Use Authorization (EUA). This EUA will remain in effect (meaning this test can be used) for the duration of the COVID-19 declaration under Section 564(b)(1) of the Act, 21 U.S.C. section 360bbb-3(b)(1), unless the authorization is terminated or revoked.  Performed at [facility], [address] [place], [place] [address]    *Note: Due to a large number of results and/or encounters for the requested  time period, some results have not been displayed. A complete set of results can be found in Results Review.    Imaging reviewed:  DG Abd Portable 1 View Result Date: 01/31/2024 CLINICAL DATA:  NG tube EXAM: PORTABLE ABDOMEN - 1 VIEW COMPARISON:  Abdominal x-ray 12/08/2023 FINDINGS: Enteric tube tip is in the proximal body of the stomach. IMPRESSION: Enteric tube tip is in the proximal body of the stomach. Electronically Signed   By: Tyron Gallon M.D.   On: 01/31/2024 22:55   DG Chest Port 1 View Result Date: 01/31/2024 CLINICAL DATA:  Questionable sepsis - evaluate for abnormality EXAM: PORTABLE CHEST 1 VIEW COMPARISON:  12/11/2023 FINDINGS: Chronic elevation of right hemidiaphragm. Stable heart size and mediastinal contours. Mild interstitial coarsening. No focal airspace disease. No pleural effusion or pneumothorax. Right-sided battery pack with lead coursing into the neck. Bilateral shoulder arthroplasties. Remote left rib fracture. IMPRESSION: Mild interstitial coarsening, may represent bronchial inflammation. No focal airspace disease. Electronically Signed   By: Chadwick Colonel M.D.   On: 01/31/2024 21:26    EKG:  Personally reviewed sinus rhythm with LAD, RBBB, LAFB, prolonged QTc 557, LVH, positive U wave, biphasic T waves anteriorly, T wave inversion laterally.   ED Course:  Initially was hypotensive so code sepsis initiated was treated with vancomycin , cefepime , Flagyl , 2.5 L IV fluid.  For hypoglycemia given amp of D50.  For hypokalemia K 10 mEq x 3 runs IV.  NGT placed and given dose of lactulose .   Assessment/Plan:  73 y.o. male with hx decompensated Nash cirrhosis, HFpEF, moderate AS, paroxysmal A-fib on anticoagulation, diabetes, hyperlipidemia, COPD/asthma, OSA, BPH with chronic Foley, question of underlying dementia, failure to thrive, recent admission 4/11 - 5/15 with hepatic encephalopathy and GI bleeding found to have portal hypertensive gastropathy and gastric and duodenal  AVM s/p APC.  Was brought in from his SNF at [facility] for increasing lethargy and altered mental status.   Encephalopathy, metabolic, acute  Hx increasing lethargy / decreased level of consciousness, disorientation x few days.  On my evaluation improving mental status per ED report no awake and interactive but remains with unintelligible speech, no focal deficit noted. Notable labs, ammonia 100, NA 152, calcium  10.8, glucose nadir 58, VBG 7.39/52, UA not consistent with infection etiology of encephalopathy appears to be multifactorial with hepatic encephalopathy, hypernatremia, dehydration, mild hypoglycemia. Additional eval per below.  - Management of hepatic encephalopathy, hyponatremia, hypovolemia per below - Check CT head with hx anticoagulation, unreliable history. - Check TSH, B12 - Delirium precautions, fall precautions - As encephalopathy improves can do swallow eval and remove NG if able to take p.o.'s  Decompensated NASH cirrhosis Hepatic encephalopathy Hx Portal HTN gastropathy, AVM with recent GI bleed.  Chronic hypotension  Suspect cause of decompensation related to decreased p.o. intake, suspect inadequate intake of lactulose , dehydration.  - Lactulose  30g 4 times daily via NG, hold if greater than 4 bowel movement in 24-hour period - Rifaximin  twice daily via NG if able to crush - Switch PPI to IV until able to take p.o. - Continue midodrine  10 mg 3 times daily - Of note wife concerned about his capacity to refuse meds at facility when his mental status worsens. She has medical POA. Discussed that capacity can change and is decision dependent. Anticipate that at times he likely does not have capacity due to his encephalopathy and medical decisions would default to POA. Capacity may be assessed by provider at his facility if this is in question. And if she would like to pursue a global evaluation of his competency that is a possibility but is a longer process.   Hypotension,  hypovolemic Lactic acidosis Initial BP 80s over 60s, improved to 130s over 90s with large-volume fluid resuscitation. Lactate 2.6. No other SIRS criteria at time of admission, doubt underlying infection.  Clinically with decreased p.o. intake leading up to admission. - S/p 2.5 L IV fluid.  Continue on mIVF for hypernatremia - Repeat lactate pending  Hypernatremia, presumed chronic (onset unknown).  Initial Na 152. Secondary to poor free water  intake.  -D5 water  at 75 cc an hour overnight, repeat Na in AM   Severe hypokalemia, suspected nutritional Initial K 2.2. Mg wnl  - Serial repletion - Telemonitoring  Mild hypoglycemia  Hx DM type 2  Mild hypoglycemia with nadir 58.  Treated with D50 in ED.  Home insulin  regimen is glargine 25 units in the morning, 8 units at night, sliding scale.  - S/p D50 in the ED.  Hypoglycemia has resolved.  Now on D5 drip for hypernatremia - Holding home basal insulin  due to initial hypoglycemia.  - SSI for very sensitive  Prolonged QTc Likely related to hypokalemia, RBBB. - Cautious with med administration, holding home mirtazapine  - Repeat EKG once K normalized  Failure to thrive Overall picture failure to thrive with inadequate p.o. intake resulting in dehydration hypovolemic hypotension, hyponatremia severe hypokalemia. - Nutrition evaluation - Once more awake swallow eval then can take regular diet - Ensure plus protein between  meals - PT/OT evaluation  ? Dysphagia  - Once encephalopathy improves SLP eval for dysphagia   Chronic medical problems: HFpEF: Without acute exacerbation.  Last echo TEE 1/' 25 LVEF 60 to 65%. Moderate AS: Noted, surveillance echo outpatient. Paroxysmal A-fib on anticoagulation: Currently in sinus rhythm.  Holding anticoagulation pending head CT results.  Continue home amiodarone . Hyperlipidemia: Hold statin for now can resume when taking p.o. COPD/asthma: DuoNeb as needed OSA: Hold CPAP tonight with aspiration  risk, can trial tomorrow if mental status improving.  BPH with chronic Foley: Foley exchanged in ED.  Question of underlying dementia: Noted, outpatient f/u   There is no height or weight on file to calculate BMI.    DVT prophylaxis:  Eliquis  Code Status:  Full Code, presumed full. Has MOST noting full measures, but last hospitalization was DNR/DNI. Readdress as mental status improves.  Diet:  Diet Orders (From admission, onward)     Start     Ordered   01/31/24 2248  Diet regular Room service appropriate? Yes; Fluid consistency: Thin  Diet effective now       Question Answer Comment  Room service appropriate? Yes   Fluid consistency: Thin      01/31/24 2253           Family Communication:  Yes discussed with ex wife / POA [name] Stejskal over the phone  Consults:  none   Admission status:   Inpatient, Step Down Unit  Severity of Illness: The appropriate patient status for this patient is INPATIENT. Inpatient status is judged to be reasonable and necessary in order to provide the required intensity of service to ensure the patient's safety. The patient's presenting symptoms, physical exam findings, and initial radiographic and laboratory data in the context of their chronic comorbidities is felt to place them at high risk for further clinical deterioration. Furthermore, it is not anticipated that the patient will be medically stable for discharge from the hospital within 2 midnights of admission.   * I certify that at the point of admission it is my clinical judgment that the patient will require inpatient hospital care spanning beyond 2 midnights from the point of admission due to high intensity of service, high risk for further deterioration and high frequency of surveillance required.*   Arnulfo Larch, MD [facility]  How to contact the [facility] Attending or Consulting provider 7A - 7P or covering provider during after hours 7P -7A, for this patient.  Check the care team in  [facility] and look for a) attending/consulting [facility] provider listed and b) the [facility] team listed Log into [email or web address] and use [place]'s universal password to access. If you do not have the password, please contact the hospital operator. Locate the [facility] provider you are looking for under [facility] and page to a number that you can be directly reached. If you still have difficulty reaching the provider, please page the [facility] (Director on Call) for the Hospitalists listed on amion for assistance.  01/31/2024, 11:25 PM

## 2024-01-31 NOTE — Progress Notes (Signed)
 CODE SEPSIS - PHARMACY COMMUNICATION  **Broad Spectrum Antibiotics should be administered within 1 hour of Sepsis diagnosis**  Time Code Sepsis Called/Page Received: 2104  Antibiotics Ordered: Cefepime , vancomycin , metronidazole   Time of 1st antibiotic administration: 2137  Additional action taken by pharmacy: N/A  Page Boast 01/31/2024  9:46 PM

## 2024-01-31 NOTE — ED Notes (Signed)
 Patient transported to CT

## 2024-01-31 NOTE — ED Provider Notes (Signed)
 [place] EMERGENCY DEPARTMENT AT [facility] Provider Note   CSN: [id number] Arrival date & time: 01/31/24  2048     History  Chief Complaint  Patient presents with   Abnormal Labs     Tony Cantu is a 73 y.o. male.  Pt is a 73 yo male with pmhx significant for gerd, gastritis, bph, htn, asthma, arthritis, sleep apnea, dm, afib (on Eliquis ), ulcers, cirrhosis with hx hepatic encephalopathy, dka, indwelling foley.  Pt is apparently normally awake and alert.  He has been somnolent for the past few days, so the facility did blood work which showed an elevated ammonia level of 113.  Pt is unable to participate in history.       Home Medications Prior to Admission medications   Medication Sig Start Date End Date Taking? Authorizing Provider  acetaminophen  (TYLENOL ) 500 MG tablet Take 1,000 mg by mouth every 6 (six) hours as needed for moderate pain.    [provider]  amiodarone  (PACERONE ) 100 MG tablet Take 1 tablet (100 mg total) by mouth daily. 01/12/24   Elgergawy, Ardia Kraft, MD  apixaban  (ELIQUIS ) 5 MG TABS tablet Take 1 tablet (5 mg total) by mouth 2 (two) times daily. 06/08/23   [name], DO  atorvastatin  (LIPITOR) 40 MG tablet Take 1 tablet (40 mg total) by mouth daily. Patient taking differently: Take 40 mg by mouth daily at 6 PM. 06/08/23   Vicky Grange M, DO  b complex vitamins tablet Take 1 tablet by mouth daily.    [provider]  cefTRIAXone  (ROCEPHIN ) 1 g injection Inject 1 g into the muscle daily. 01/27/24   [provider]  dextromethorphan-guaiFENesin (ROBITUSSIN-DM) 10-100 MG/5ML liquid Take 10 mLs by mouth in the morning and at bedtime.    [provider]  ergocalciferol  (VITAMIN D2) 1.25 MG (50000 UT) capsule Take 50,000 Units by mouth See admin instructions. Take 1 capsule (50,000 units) by mouth twice weekly on Wednesday and Sunday.    [provider]  feeding supplement, GLUCERNA SHAKE,  (GLUCERNA SHAKE) LIQD Take 237 mLs by mouth 3 (three) times daily between meals. 01/11/24   Elgergawy, Ardia Kraft, MD  ferrous sulfate  325 (65 FE) MG EC tablet Take 1 tablet (325 mg total) by mouth daily with breakfast. 01/18/23   Pennington, March Service, PA-C  Glucagon , rDNA, (GLUCAGON  EMERGENCY) 1 MG KIT INJECT AS DIRECTED INTO  UPPER ARM, THIGH OR  BUTTOCKS AS NEEDED FOR  SEVERE HYPOGLYCEMIA. SEEK  MEDICAL ATTENTION AFTER USE 06/14/21   Zorita Hiss, FNP  Glucerna [facility]) LIQD Take 120-237 mLs by mouth See admin instructions. Drink 1 bottle 3 times daily, after meals at 1000, 1300 and 1800. If BS < 60, provide of Glucerna and recheck blood sugar in 15 minutes.    [provider]  insulin  glargine-yfgn (SEMGLEE ) 100 UNIT/ML injection Inject 0.25 mLs (25 Units total) into the skin every morning. 01/12/24   Elgergawy, Ardia Kraft, MD  insulin  glargine-yfgn (SEMGLEE ) 100 UNIT/ML injection Inject 0.08 mLs (8 Units total) into the skin at bedtime. 01/11/24   Elgergawy, Ardia Kraft, MD  ipratropium-albuterol  (DUONEB) 0.5-2.5 (3) MG/3ML SOLN Take 3 mLs by nebulization every 6 (six) hours as needed. Patient taking differently: Take 3 mLs by nebulization every 6 (six) hours as needed (wheezing, SOB). 12/21/23   Ghimire, Estil Heman, MD  lactulose  (CHRONULAC ) 10 GM/15ML solution Take 45 mLs (30 g total) by mouth 3 (three) times daily. 12/21/23   Ghimire, Estil Heman,  MD  LANTUS  SOLOSTAR 100 UNIT/ML Solostar Pen Inject into the skin. 01/28/24   [provider]  magnesium  oxide (MAG-OX) 400 (240 Mg) MG tablet Take 400 mg by mouth daily.    [provider]  melatonin 3 MG TABS tablet Take 1 tablet (3 mg total) by mouth at bedtime as needed. 12/21/23   Ghimire, Estil Heman, MD  midodrine  (PROAMATINE ) 10 MG tablet Take 1 tablet (10 mg total) by mouth 3 (three) times daily with meals. 12/21/23   Ghimire, Estil Heman, MD  mirtazapine  (REMERON  SOL-TAB) 15 MG disintegrating tablet Take 1 tablet (15 mg total)  by mouth at bedtime. 01/11/24   Elgergawy, Ardia Kraft, MD  montelukast  (SINGULAIR ) 10 MG tablet Take 1 tablet (10 mg total) by mouth at bedtime. 06/08/23   [name], DO  Multiple Vitamins-Minerals (MULTIVITAMIN WITH MINERALS) tablet Take 1 tablet by mouth daily.    [provider]  NOVOLOG  FLEXPEN 100 UNIT/ML FlexPen Inject 0-9 Units into the skin 3 (three) times daily with meals. CBG 70 - 120: 0 units  CBG 121 - 150: 1 unit  CBG 151 - 200: 2 units  CBG 201 - 250: 3 units  CBG 251 - 300: 5 units  CBG 301 - 350: 7 units  CBG 351 - 400: 9 units 01/28/24   [provider]  nutrition supplement, JUVEN, (JUVEN) PACK Take 1 packet by mouth 2 (two) times daily at 8 am and 10 pm. 01/11/24   Elgergawy, Ardia Kraft, MD  pantoprazole  (PROTONIX ) 40 MG tablet TAKE 1 TABLET BY MOUTH TWICE  DAILY BEFORE MEALS 10/16/23   Pyrtle, Amber Bail, MD  Potassium Chloride  ER 20 MEQ TBCR Take by mouth. 01/24/24   [provider]  rifaximin  (XIFAXAN ) 550 MG TABS tablet Take 1 tablet (550 mg total) by mouth 2 (two) times daily. 12/21/23   Ghimire, Estil Heman, MD      Allergies    Ak-mycin [erythromycin], Dimetapp children's cold-cough, and Sulfonamide derivatives    Review of Systems   Review of Systems  Unable to perform ROS: Mental status change  All other systems reviewed and are negative.   Physical Exam Updated Vital Signs BP (!) 130/95   Pulse (!) 40   Temp 99.5 F (37.5 C) (Rectal)   Resp 18   SpO2 99%  Physical Exam Vitals and nursing note reviewed.  Constitutional:      General: He is in acute distress.     Appearance: He is ill-appearing.  HENT:     Head: Normocephalic and atraumatic.     Right Ear: External ear normal.     Left Ear: External ear normal.     Nose: Nose normal.     Mouth/Throat:     Mouth: Mucous membranes are dry.  Eyes:     Extraocular Movements: Extraocular movements intact.     Conjunctiva/sclera: Conjunctivae normal.     Pupils: Pupils are equal,  round, and reactive to light.  Cardiovascular:     Rate and Rhythm: Normal rate and regular rhythm.     Pulses: Normal pulses.     Heart sounds: Normal heart sounds.  Pulmonary:     Effort: Pulmonary effort is normal.     Breath sounds: Normal breath sounds.  Abdominal:     General: Abdomen is flat. Bowel sounds are normal.     Palpations: Abdomen is soft.  Genitourinary:    Comments: Indwelling foley Musculoskeletal:        General: Normal range  of motion.     Cervical back: Normal range of motion and neck supple.  Skin:    General: Skin is warm.     Capillary Refill: Capillary refill takes less than 2 seconds.  Neurological:     Mental Status: He is lethargic.     GCS: GCS eye subscore is 2. GCS verbal subscore is 2. GCS motor subscore is 5.  Psychiatric:     Comments: Unable to assess     ED Results / Procedures / Treatments   Labs (all labs ordered are listed, but only abnormal results are displayed) Labs Reviewed  LACTIC ACID, PLASMA - Abnormal; Notable for the following components:      Result Value   Lactic Acid, Venous 2.3 (*)    All other components within normal limits  COMPREHENSIVE METABOLIC PANEL WITH GFR - Abnormal; Notable for the following components:   Sodium 152 (*)    Potassium 2.2 (*)    Chloride 113 (*)    Glucose, Bld 62 (*)    Calcium  10.8 (*)    Total Protein 6.4 (*)    Albumin  2.7 (*)    AST 53 (*)    All other components within normal limits  CBC WITH DIFFERENTIAL/PLATELET - Abnormal; Notable for the following components:   RBC 3.52 (*)    Hemoglobin 12.7 (*)    HCT 37.5 (*)    MCV 106.5 (*)    MCH 36.1 (*)    RDW 16.8 (*)    Platelets 111 (*)    Monocytes Absolute 1.1 (*)    All other components within normal limits  PROTIME-INR - Abnormal; Notable for the following components:   Prothrombin  Time 18.1 (*)    INR 1.5 (*)    All other components within normal limits  URINALYSIS, W/ REFLEX TO CULTURE (INFECTION SUSPECTED) - Abnormal;  Notable for the following components:   Hgb urine dipstick SMALL (*)    Leukocytes,Ua SMALL (*)    All other components within normal limits  AMMONIA - Abnormal; Notable for the following components:   Ammonia 100 (*)    All other components within normal limits  MAGNESIUM  - Abnormal; Notable for the following components:   Magnesium  2.5 (*)    All other components within normal limits  CBG MONITORING, ED - Abnormal; Notable for the following components:   Glucose-Capillary 58 (*)    All other components within normal limits  RESP PANEL BY RT-PCR (RSV, FLU A&B, COVID)  RVPGX2  CULTURE, BLOOD (ROUTINE X 2)  CULTURE, BLOOD (ROUTINE X 2)  URINE CULTURE  BETA-HYDROXYBUTYRIC ACID  LACTIC ACID, PLASMA  BLOOD GAS, VENOUS    EKG EKG Interpretation Date/Time:  Wednesday January 31 2024 21:02:27 EDT Ventricular Rate:  74 PR Interval:  124 QRS Duration:  147 QT Interval:  502 QTC Calculation: 557 R Axis:   -62  Text Interpretation: Sinus rhythm RBBB and LAFB Abnormal T, consider ischemia, lateral leads No significant change since last tracing Confirmed by Sueellen Emery [phone]) on 01/31/2024 9:33:53 PM  Radiology DG Chest Port 1 View Result Date: 01/31/2024 CLINICAL DATA:  Questionable sepsis - evaluate for abnormality EXAM: PORTABLE CHEST 1 VIEW COMPARISON:  12/11/2023 FINDINGS: Chronic elevation of right hemidiaphragm. Stable heart size and mediastinal contours. Mild interstitial coarsening. No focal airspace disease. No pleural effusion or pneumothorax. Right-sided battery pack with lead coursing into the neck. Bilateral shoulder arthroplasties. Remote left rib fracture. IMPRESSION: Mild interstitial coarsening, may represent bronchial inflammation. No focal airspace disease. Electronically  Signed   By: Chadwick Colonel M.D.   On: 01/31/2024 21:26    Procedures Procedures    Medications Ordered in ED Medications  lactated ringers  infusion (has no administration in time range)   lactated ringers  bolus 1,000 mL (1,000 mLs Intravenous New Bag/Given 01/31/24 2137)    And  lactated ringers  bolus 1,000 mL (1,000 mLs Intravenous New Bag/Given 01/31/24 2138)    And  lactated ringers  bolus 500 mL (has no administration in time range)  metroNIDAZOLE  (FLAGYL ) IVPB 500 mg (500 mg Intravenous New Bag/Given 01/31/24 2202)  vancomycin  (VANCOCIN ) IVPB 1000 mg/200 mL premix (has no administration in time range)  lactulose  (CHRONULAC ) 10 GM/15ML solution 30 g (has no administration in time range)  potassium chloride  10 mEq in 100 mL IVPB (has no administration in time range)  ceFEPIme  (MAXIPIME ) 2 g in sodium chloride  0.9 % 100 mL IVPB (0 g Intravenous Stopped 01/31/24 2200)  dextrose  50 % solution 50 mL (50 mLs Intravenous Given 01/31/24 2200)    ED Course/ Medical Decision Making/ A&P                                 Medical Decision Making Amount and/or Complexity of Data Reviewed Labs: ordered. Radiology: ordered.  Risk Prescription drug management. Decision regarding hospitalization.   This patient presents to the ED for concern of ams, this involves an extensive number of treatment options, and is a complaint that carries with it a high risk of complications and morbidity.  The differential diagnosis includes sepsis, hepatic encephalopathy, electrolyte abn, gi bleed   Co morbidities that complicate the patient evaluation   gerd, gastritis, bph, htn, asthma, arthritis, sleep apnea, dm, afib (on Eliquis ), ulcers, cirrhosis with hx hepatic encephalopathy, dka, indwelling foley   Additional history obtained:  Additional history obtained from epic chart review External records from outside source obtained and reviewed including EMS report   Lab Tests:  I Ordered, and personally interpreted labs.  The pertinent results include:  cbc with hgb 12.7, plt 111 (hgb 11.7 on 5/30 and plt 118 on 5/30); cmp with na elevated at 152, k low at 2.2, glucose low at 62; ast elevated at  53; lactic elevated at 2.3; ammonia elevated at 100; mg elevated at 2.5; urine nl (new foley), inr 1.5; bhb neg   Imaging Studies ordered:  I ordered imaging studies including cxr  I independently visualized and interpreted imaging which showed  Mild interstitial coarsening, may represent bronchial inflammation.  No focal airspace disease.   I agree with the radiologist interpretation   Cardiac Monitoring:  The patient was maintained on a cardiac monitor.  I personally viewed and interpreted the cardiac monitored which showed an underlying rhythm of: nsr   Medicines ordered and prescription drug management:  I ordered medication including ivfs/abx  for sepsis  Reevaluation of the patient after these medicines showed that the patient improved I have reviewed the patients home medicines and have made adjustments as needed   Test Considered:  ct   Critical Interventions:  Ivfs/abx   Consultations Obtained:  I requested consultation with the hospitalist (Dr. Amy Kansky),  and discussed lab and imaging findings as well as pertinent plan -he will admit   Problem List / ED Course:  Sepsis:  pt altered and hypotensive, so a code sepsis was called.  Pt given sepsis fluids and iv abx.  No obvious source of infection.  Bp did respond to  fluids and he's more alert. Hepatic encephalopathy:  ng placed.  Lactulose  given via ng Hypernatremia:  likely from dehydration.  Ivfs given Hypokalemia:  likely from poor nutrition.  Iv kcl given Thrombocytopenia:  chronic Hypoglycemia:  likely poor nutrition.  I amp given   Reevaluation:  After the interventions noted above, I reevaluated the patient and found that they have :improved   Social Determinants of Health:  Lives in a facility   Dispostion:  After consideration of the diagnostic results and the patients response to treatment, I feel that the patent would benefit from admission.    CRITICAL CARE Performed by: Sueellen Emery   Total critical care time: 45 minutes  Critical care time was exclusive of separately billable procedures and treating other patients.  Critical care was necessary to treat or prevent imminent or life-threatening deterioration.  Critical care was time spent personally by me on the following activities: development of treatment plan with patient and/or surrogate as well as nursing, discussions with consultants, evaluation of patient's response to treatment, examination of patient, obtaining history from patient or surrogate, ordering and performing treatments and interventions, ordering and review of laboratory studies, ordering and review of radiographic studies, pulse oximetry and re-evaluation of patient's condition.         Final Clinical Impression(s) / ED Diagnoses Final diagnoses:  Hepatic encephalopathy (HCC)  Hypokalemia  Thrombocytopenia (HCC)  Hypoglycemia  Hypernatremia  SIRS (systemic inflammatory response syndrome) (HCC)    Rx / DC Orders ED Discharge Orders     None         Sueellen Emery, MD 01/31/24 2235

## 2024-01-31 NOTE — Sepsis Progress Note (Signed)
 Elink monitoring for the code sepsis protocol.

## 2024-01-31 NOTE — ED Triage Notes (Signed)
 Pt bib [facility], per facility pt has not had normal po intake the past few days. Facility did lab work and found his ammonia to be 113. Hx of Hepatic encephalopathy   Per EMS nurse at facility said pt was A&Ox4 2 weeks ago when she last took care of him but has very lethargic today.

## 2024-02-01 DIAGNOSIS — Z515 Encounter for palliative care: Secondary | ICD-10-CM

## 2024-02-01 DIAGNOSIS — G9341 Metabolic encephalopathy: Secondary | ICD-10-CM | POA: Diagnosis not present

## 2024-02-01 DIAGNOSIS — E860 Dehydration: Secondary | ICD-10-CM | POA: Insufficient documentation

## 2024-02-01 DIAGNOSIS — Z7189 Other specified counseling: Secondary | ICD-10-CM

## 2024-02-01 DIAGNOSIS — E876 Hypokalemia: Secondary | ICD-10-CM | POA: Insufficient documentation

## 2024-02-01 DIAGNOSIS — R627 Adult failure to thrive: Secondary | ICD-10-CM | POA: Diagnosis not present

## 2024-02-01 DIAGNOSIS — K7682 Hepatic encephalopathy: Secondary | ICD-10-CM | POA: Diagnosis not present

## 2024-02-01 DIAGNOSIS — E162 Hypoglycemia, unspecified: Secondary | ICD-10-CM

## 2024-02-01 DIAGNOSIS — E870 Hyperosmolality and hypernatremia: Secondary | ICD-10-CM | POA: Insufficient documentation

## 2024-02-01 LAB — HEPATIC FUNCTION PANEL
ALT: 34 U/L (ref 0–44)
AST: 59 U/L — ABNORMAL HIGH (ref 15–41)
Albumin: 2.4 g/dL — ABNORMAL LOW (ref 3.5–5.0)
Alkaline Phosphatase: 105 U/L (ref 38–126)
Bilirubin, Direct: 0.4 mg/dL — ABNORMAL HIGH (ref 0.0–0.2)
Indirect Bilirubin: 0.9 mg/dL (ref 0.3–0.9)
Total Bilirubin: 1.3 mg/dL — ABNORMAL HIGH (ref 0.0–1.2)
Total Protein: 5.7 g/dL — ABNORMAL LOW (ref 6.5–8.1)

## 2024-02-01 LAB — CBC
HCT: 35.5 % — ABNORMAL LOW (ref 39.0–52.0)
Hemoglobin: 11.3 g/dL — ABNORMAL LOW (ref 13.0–17.0)
MCH: 35 pg — ABNORMAL HIGH (ref 26.0–34.0)
MCHC: 31.8 g/dL (ref 30.0–36.0)
MCV: 109.9 fL — ABNORMAL HIGH (ref 80.0–100.0)
Platelets: 86 10*3/uL — ABNORMAL LOW (ref 150–400)
RBC: 3.23 MIL/uL — ABNORMAL LOW (ref 4.22–5.81)
RDW: 17.2 % — ABNORMAL HIGH (ref 11.5–15.5)
WBC: 6.4 10*3/uL (ref 4.0–10.5)
nRBC: 0 % (ref 0.0–0.2)

## 2024-02-01 LAB — AMMONIA: Ammonia: 68 umol/L — ABNORMAL HIGH (ref 9–35)

## 2024-02-01 LAB — LACTIC ACID, PLASMA: Lactic Acid, Venous: 2.3 mmol/L (ref 0.5–1.9)

## 2024-02-01 LAB — BASIC METABOLIC PANEL WITH GFR
Anion gap: 10 (ref 5–15)
BUN: 14 mg/dL (ref 8–23)
CO2: 26 mmol/L (ref 22–32)
Calcium: 10.1 mg/dL (ref 8.9–10.3)
Chloride: 112 mmol/L — ABNORMAL HIGH (ref 98–111)
Creatinine, Ser: 0.86 mg/dL (ref 0.61–1.24)
GFR, Estimated: >60 mL/min (ref >60)
Glucose, Bld: 364 mg/dL — ABNORMAL HIGH (ref 70–99)
Potassium: 3.3 mmol/L — ABNORMAL LOW (ref 3.5–5.1)
Sodium: 148 mmol/L — ABNORMAL HIGH (ref 135–145)

## 2024-02-01 LAB — PHOSPHORUS: Phosphorus: 2 mg/dL — ABNORMAL LOW (ref 2.5–4.6)

## 2024-02-01 LAB — GLUCOSE, CAPILLARY
Glucose-Capillary: 378 mg/dL — ABNORMAL HIGH (ref 70–99)
Glucose-Capillary: 332 mg/dL — ABNORMAL HIGH (ref 70–99)
Glucose-Capillary: 332 mg/dL — ABNORMAL HIGH (ref 70–99)
Glucose-Capillary: 556 mg/dL (ref 70–99)

## 2024-02-01 LAB — VITAMIN B12: Vitamin B-12: 1685 pg/mL — ABNORMAL HIGH (ref 180–914)

## 2024-02-01 LAB — TSH: TSH: 1.077 u[IU]/mL (ref 0.350–4.500)

## 2024-02-01 LAB — FOLATE: Folate: 7.1 ng/mL (ref >5.9)

## 2024-02-01 LAB — MAGNESIUM: Magnesium: 2.1 mg/dL (ref 1.7–2.4)

## 2024-02-01 LAB — MRSA NEXT GEN BY PCR, NASAL: MRSA by PCR Next Gen: NOT DETECTED

## 2024-02-01 MED ORDER — OSMOLITE 1.5 CAL PO LIQD
1000.0000 mL | ORAL | Status: DC
  Administered 2024-02-01 – 2024-02-04 (×2): 1000 mL

## 2024-02-01 MED ORDER — INSULIN ASPART 100 UNIT/ML IJ SOLN
15.0000 [IU] | Freq: Once | INTRAMUSCULAR | Status: AC
  Administered 2024-02-01: 15 [IU] via SUBCUTANEOUS

## 2024-02-01 MED ORDER — ENSURE PLUS HIGH PROTEIN PO LIQD
237.0000 mL | Freq: Three times a day (TID) | ORAL | Status: DC
  Administered 2024-02-04 – 2024-02-06 (×9): 237 mL via ORAL

## 2024-02-01 MED ORDER — POTASSIUM PHOSPHATES 15 MMOLE/5ML IV SOLN
15.0000 mmol | Freq: Once | INTRAVENOUS | Status: AC
  Administered 2024-02-01: 15 mmol via INTRAVENOUS
  Filled 2024-02-01: qty 5

## 2024-02-01 MED ORDER — ORAL CARE MOUTH RINSE: 15.0000 mL | OROMUCOSAL | Status: DC | PRN

## 2024-02-01 MED ORDER — APIXABAN 5 MG PO TABS
5.0000 mg | ORAL_TABLET | Freq: Two times a day (BID) | ORAL | Status: DC
  Administered 2024-02-01 – 2024-02-04 (×7): 5 mg
  Filled 2024-02-01 (×7): qty 1

## 2024-02-01 MED ORDER — JUVEN PO PACK
1.0000 | PACK | Freq: Two times a day (BID) | ORAL | Status: DC
  Administered 2024-02-02 – 2024-02-04 (×5): 1
  Filled 2024-02-01 (×5): qty 1

## 2024-02-01 MED ORDER — INSULIN GLARGINE-YFGN 100 UNIT/ML ~~LOC~~ SOLN
5.0000 [IU] | Freq: Every day | SUBCUTANEOUS | Status: DC
  Administered 2024-02-01: 5 [IU] via SUBCUTANEOUS
  Filled 2024-02-01 (×3): qty 0.05

## 2024-02-01 MED ORDER — FREE WATER
200.0000 mL | Status: DC
  Administered 2024-02-01 – 2024-02-03 (×11): 200 mL

## 2024-02-01 MED ORDER — PROSOURCE TF20 ENFIT COMPATIBL EN LIQD
60.0000 mL | Freq: Every day | ENTERAL | Status: DC
  Administered 2024-02-02 – 2024-02-04 (×3): 60 mL
  Filled 2024-02-01 (×3): qty 60

## 2024-02-01 MED ORDER — SODIUM CHLORIDE 0.9 % IV BOLUS
1000.0000 mL | Freq: Once | INTRAVENOUS | Status: AC
  Administered 2024-02-01: 1000 mL via INTRAVENOUS

## 2024-02-01 MED ORDER — ORAL CARE MOUTH RINSE
15.0000 mL | OROMUCOSAL | Status: DC
  Administered 2024-02-01 – 2024-02-09 (×27): 15 mL via OROMUCOSAL

## 2024-02-01 MED ORDER — INSULIN ASPART 100 UNIT/ML IJ SOLN: 0.0000 [IU] | Freq: Every day | INTRAMUSCULAR | Status: DC

## 2024-02-01 MED ORDER — KCL IN DEXTROSE-NACL 20-5-0.45 MEQ/L-%-% IV SOLN: INTRAVENOUS | Status: DC

## 2024-02-01 MED ORDER — INSULIN ASPART 100 UNIT/ML IJ SOLN
0.0000 [IU] | Freq: Three times a day (TID) | INTRAMUSCULAR | Status: DC
  Administered 2024-02-02: 15 [IU] via SUBCUTANEOUS

## 2024-02-01 NOTE — Inpatient Diabetes Management (Signed)
 Inpatient Diabetes Program Recommendations  AACE/ADA: New Consensus Statement on Inpatient Glycemic Control   Target Ranges:  Prepandial:   less than 140 mg/dL      Peak postprandial:   less than 180 mg/dL (1-2 hours)      Critically ill patients:  140 - 180 mg/dL    Latest Reference Range & Units 01/31/24 21:49 01/31/24 23:09 02/01/24 07:55 02/01/24 11:29  Glucose-Capillary 70 - 99 mg/dL 58 (L) 478 (H) 295 (H)  Novolog  5 units 332 (H)  Novolog  4 units  Semglee  5 units   Review of Glycemic Control  Diabetes history: DM2 Outpatient Diabetes medications: Semglee  25 units QAM, Semglee  8 units at bedtime, Novolog  0-9 units TID with meals Current orders for Inpatient glycemic control: Semglee  5 units daily, Novolog  0-6 units TID with meals  Inpatient Diabetes Program Recommendations:    Insulin : If CBGs remain consistently over 180 mg/dl, please consider adding Semglee  10 units at bedtime and increasing Novolog  correction to 0-9 units AC&HS.  NOTE: Patient was recently inpatient 12/08/23-01/11/24 and was discharged to SNF. Patient presented to ED from SNF with lethargy and altered mental status. Initially hypoglycemic (CBG 58 mg/dl on 01/29/12 at 08:65). CBG 378 mg/dl at 7:84 am and 696 mg/dl at 29:52 am today. Noted Semglee  5 units given at 11:36 am today.     Thanks, Beacher Limerick, RN, MSN, CDCES Diabetes Coordinator Inpatient Diabetes Program [phone] (Team Pager from 8am to 5pm)

## 2024-02-01 NOTE — Evaluation (Signed)
 Clinical/Bedside Swallow Evaluation Patient Details  Name: Tony Cantu MRN: [id number] Date of Birth: 05/24/1951  Today's Date: 02/01/2024 Time: SLP Start Time (ACUTE ONLY): 1354 SLP Stop Time (ACUTE ONLY): 1417 SLP Time Calculation (min) (ACUTE ONLY): 23 min  Past Medical History:  Past Medical History:  Diagnosis Date   Acute renal injury (HCC) 08/06/2016   Aortic atherosclerosis (HCC)    Aortic stenosis    Arthritis    Asthma    BPH (benign prostatic hypertrophy)    Cirrhosis (HCC) 2016   Colon polyps    Diabetes mellitus without complication (HCC)    TYPE 1    Diverticulosis    DKA (diabetic ketoacidosis) (HCC) 10/25/2023   Dysrhythmia 04/19/2021   A-fib noted with possible A-fib RVR   Gastric ulcer    Gastric varices    right   Gastritis    GERD (gastroesophageal reflux disease)    Headache    History- pt states these were migraines that occurred in the 1980's   Heart murmur    History of kidney stones    Hx of adenomatous colonic polyps    Hypertension    Iron  deficiency anemia due to chronic blood loss 08/16/2023   Melena 09/20/2023   Portal hypertensive gastropathy (HCC)    Postoperative fever 03/10/2022   Renal cyst, right    Seizures (HCC)    HYPOGLYCEMIC LAST 1 AND 1/2 YRS AGO   Sepsis (HCC) 09/20/2023   Sleep apnea    uses CPAP nightly   Stroke-like symptom 09/20/2023   Testicle trouble    one testicle BORN WITH   Tubular adenoma of colon    Past Surgical History:  Past Surgical History:  Procedure Laterality Date   BACK SURGERY     94  LOWER    CARPAL TUNNEL RELEASE Right 10/13/2015   Procedure: RIGHT CARPAL TUNNEL RELEASE;  Surgeon: Lyanne Sample, MD;  Location: [facility];  Service: Orthopedics;  Laterality: Right;   CARPAL TUNNEL RELEASE Left 07/05/2016   Procedure: LEFT CARPAL TUNNEL RELEASE;  Surgeon: Lyanne Sample, MD;  Location: [facility];  Service: Orthopedics;  Laterality: Left;   COLONOSCOPY WITH  ESOPHAGOGASTRODUODENOSCOPY (EGD)  10/05/2022   DRUG INDUCED ENDOSCOPY N/A 07/02/2021   Procedure: DRUG INDUCED SLEEP ENDOSCOPY;  Surgeon: Virgina Grills, MD;  Location: [facility] OR;  Service: ENT;  Laterality: N/A;   ESOPHAGOGASTRODUODENOSCOPY N/A 12/13/2023   Procedure: EGD (ESOPHAGOGASTRODUODENOSCOPY);  Surgeon: Daina Drum, MD;  Location: [facility] ENDOSCOPY;  Service: Gastroenterology;  Laterality: N/A;   ESOPHAGOGASTRODUODENOSCOPY (EGD) WITH PROPOFOL  N/A 09/25/2023   Procedure: ESOPHAGOGASTRODUODENOSCOPY (EGD) WITH PROPOFOL ;  Surgeon: Albertina Hugger, MD;  Location: [facility] ENDOSCOPY;  Service: Gastroenterology;  Laterality: N/A;  EGD needs to be coordinated with TEE.  Patient has severe aortic stenosis, procedures need to be done to follow each other with 1 sedation   HOT HEMOSTASIS N/A 12/13/2023   Procedure: EGD, WITH ARGON PLASMA COAGULATION;  Surgeon: Daina Drum, MD;  Location: [facility] ENDOSCOPY;  Service: Gastroenterology;  Laterality: N/A;   IMPLANTATION OF HYPOGLOSSAL NERVE STIMULATOR Right 10/01/2021   Procedure: IMPLANTATION OF HYPOGLOSSAL NERVE STIMULATOR;  Surgeon: Virgina Grills, MD;  Location: [facility] OR;  Service: ENT;  Laterality: Right;   INGUINAL HERNIA REPAIR  2003   right    KNEE ARTHROSCOPY Right 03/03/2016   LUMBAR DISC SURGERY  03/1995   Dr. Burr Cary, discectomy   LUMBAR LAMINECTOMY/DECOMPRESSION MICRODISCECTOMY Right 10/06/2016   Procedure: RIGHT LUMBAR THREE - LUMBAR FOUR  LAMINECTOMY, FORAMINOTOMY  AND MICRODISCECTOMY;  Surgeon: Yvonna Herder, MD;  Location: [facility] OR;  Service: Neurosurgery;  Laterality: Right;   SHOULDER SURGERY  11/28/2005   left partial   SHOULDER SURGERY  07/14/2006   RIGHT   TEE WITHOUT CARDIOVERSION N/A 09/25/2023   Procedure: TRANSESOPHAGEAL ECHOCARDIOGRAM (TEE);  Surgeon: Mardell Shade, MD;  Location: [facility] ENDOSCOPY;  Service: Cardiovascular;  Laterality: N/A;   TONSILLECTOMY  AGE 80 OR 4   TOTAL KNEE ARTHROPLASTY Right 03/07/2022   Procedure: RIGHT TOTAL  KNEE ARTHROPLASTY;  Surgeon: Wendolyn Hamburger, MD;  Location: [facility] ORS;  Service: Orthopedics;  Laterality: Right;   TOTAL KNEE ARTHROPLASTY Left 06/06/2022   Procedure: LEFT TOTAL KNEE ARTHROPLASTY;  Surgeon: Wendolyn Hamburger, MD;  Location: [facility] ORS;  Service: Orthopedics;  Laterality: Left;   TOTAL SHOULDER ARTHROPLASTY Right 11/01/2018   Procedure: RIGHT SHOULDER REVISION TO REVERSE TOTAL SHOULDER;  Surgeon: Sammye Cristal, MD;  Location: [facility] ORS;  Service: Orthopedics;  Laterality: Right;  CHOICE ANESTHESIA WITH INTERSCALENE BLOCK EXPAREL , NEEDS RNFA   HPI:  Tony Cantu is a 73 y.o. male with hx of decompensated Nash cirrhosis, HFpEF, paroxysmal A-fib on anticoagulation, diabetes, hyperlipidemia, COPD/asthma, OSA, BPH with chronic Foley, question of underlying dementia, failure to thrive, recent admission 4/11 - 5/15 with hepatic encephalopathy and GI bleeding found to have portal hypertensive gastropathy and gastric and duodenal AVM s/p APC.  Was brought in from his SNF at [facility] for increasing lethargy and altered mental status.  History is limited, per triage notes decreased p.o. times few days and increasing lethargy.  Facility did lab work with ammonia level of 113 so sent to the ED.     Discussed with his ex wife and POA Tony Cantu who was notified by SNF of his status and plan for hospitalization. She notes concern about ability to take in PO and limited access to food / water  and him not requesting from staff. Also reports having possible dysphagia / things getting "hung up" when he swallows. Concern about patient refusal of meds and impaired capacity during periods of encephalopathy. Pt was admitted from [place]. He had a prolonged hospitalization last month at [facility] and was seen by SLP and cleared for regular textures and thin liquids at that time (April). BSE requested.    Assessment / Plan / Recommendation  Clinical Impression  Clinical swlallow evaluation completed at bedside, however in  a limited capacity due to Pt lethargy. Pt has NG in place for oral meds per nursing. Pt's family present for evaluation and his ex-wife indicates that Pt was alert and eating a few days ago before almost suddenly shifting to lethargy at [facility]. She stated that Pt was at [facility] for an extended time because they had a difficult time with post acute d/c placement. He was seen by SLP at [facility] and while initially on NTL, was quickly transitioned to regular textures and thin liquids and was consuming without incident. Pt only followed a few commands due to lethargy and eventually allowed SLP to complete oral care. Pt voiced "ahh" upon request and did manipulate the ice chips with very delayed swallow trigger. Pt not alert enough for additional trials. MD ordered reg/thin which is likely fine if/when Pt is alert. Recommend not feeding Pt until he is sufficiently alert and requesting food/drink, but continue oral care and offer ice chips for comfort. SLP will follow during acute stay. Above to family and nursing.  SLP Visit Diagnosis: Dysphagia, unspecified (R13.10)    Aspiration Risk  Mild  aspiration risk;Risk for inadequate nutrition/hydration    Diet Recommendation Regular;Thin liquid (Per MD, however only feed if alert, hold PO for now)    Liquid Administration via: Cup Medication Administration: Via alternative means Supervision: Staff to assist with self feeding Compensations: Slow rate;Small sips/bites Postural Changes: Seated upright at 90 degrees;Remain upright for at least 30 minutes after po intake    Other  Recommendations Oral Care Recommendations: Oral care BID     Assistance Recommended at Discharge    Functional Status Assessment Patient has had a recent decline in their functional status and demonstrates the ability to make significant improvements in function in a reasonable and predictable amount of time.  Frequency and Duration min 2x/week  1 week       Prognosis Prognosis for  improved oropharyngeal function: Fair Barriers to Reach Goals:  (lethargy)      Swallow Study   General Date of Onset: 01/31/24 HPI: Tony Cantu is a 73 y.o. male with hx of decompensated Nash cirrhosis, HFpEF, paroxysmal A-fib on anticoagulation, diabetes, hyperlipidemia, COPD/asthma, OSA, BPH with chronic Foley, question of underlying dementia, failure to thrive, recent admission 4/11 - 5/15 with hepatic encephalopathy and GI bleeding found to have portal hypertensive gastropathy and gastric and duodenal AVM s/p APC.  Was brought in from his SNF at [facility] for increasing lethargy and altered mental status.  History is limited, per triage notes decreased p.o. times few days and increasing lethargy.  Facility did lab work with ammonia level of 113 so sent to the ED.     Discussed with his ex wife and POA Tony Cantu who was notified by SNF of his status and plan for hospitalization. She notes concern about ability to take in PO and limited access to food / water  and him not requesting from staff. Also reports having possible dysphagia / things getting "hung up" when he swallows. Concern about patient refusal of meds and impaired capacity during periods of encephalopathy. Pt was admitted from [place]. He had a prolonged hospitalization last month at [facility] and was seen by SLP and cleared for regular textures and thin liquids at that time (April). BSE requested. Type of Study: Bedside Swallow Evaluation Previous Swallow Assessment: BSE in Jan at [facility] and in April in [facility], d/c on reg/thin Diet Prior to this Study: Regular;Thin liquids (Level 0) Temperature Spikes Noted: No Respiratory Status: Room air History of Recent Intubation: No Behavior/Cognition: Lethargic/Drowsy;Requires cueing Oral Cavity Assessment: Within Functional Limits Oral Care Completed by SLP: Yes Oral Cavity - Dentition: Adequate natural dentition Vision: Functional for self-feeding Self-Feeding Abilities: Total  assist Patient Positioning: Upright in bed Baseline Vocal Quality: Normal (minimal vocalizations however due to lethargy) Volitional Cough: Cognitively unable to elicit Volitional Swallow: Able to elicit    Oral/Motor/Sensory Function Overall Oral Motor/Sensory Function: Other (comment) (Pt unable to participate in oral motor examination due to lethargy)   Ice Chips Ice chips: Impaired Presentation: Spoon Oral Phase Impairments: Reduced labial seal;Reduced lingual movement/coordination Oral Phase Functional Implications: Oral holding Pharyngeal Phase Impairments: Suspected delayed Swallow;Multiple swallows   Thin Liquid Thin Liquid: Not tested    Nectar Thick Nectar Thick Liquid: Not tested   Honey Thick Honey Thick Liquid: Not tested   Puree Puree: Not tested   Solid     Solid: Not tested     Thank you,  Claudetta Cuba, CCC-SLP [phone]  Ellie Spickler 02/01/2024,2:26 PM

## 2024-02-01 NOTE — Progress Notes (Addendum)
 Initial Nutrition Assessment  DOCUMENTATION CODES:   Not applicable  INTERVENTION:   Osmolite 1.5@60ml /hr- Initiate at 33ml/hr and increase by 10ml/hr q 8 hours until goal rate is reached.   ProSource TF 20- Give 60ml daily via tube, each supplement provides 80kcal and 20g of protein.   Free water  flushes 200ml q4 hours  Regimen provides 2240kcal/day, 110g/day protein and 2297ml/day of free water .   Pt at high refeed risk; recommend monitor potassium, magnesium  and phosphorus labs daily until stable  Continue folic acid , MVI and thiamine daily   Juven Fruit Punch BID via tube, each serving provides 95kcal and 2.5g of protein (amino acids glutamine and arginine)  Daily weights   NUTRITION DIAGNOSIS:   Inadequate oral intake related to acute illness as evidenced by NPO status.  GOAL:   Patient will meet greater than or equal to 90% of their needs  MONITOR:   Labs, Weight trends, TF tolerance, I & O's, Skin  REASON FOR ASSESSMENT:   Consult Assessment of nutrition requirement/status  ASSESSMENT:   73 y.o. male with h/o NASH cirrhosis, HFpEF, paroxysmal A-fib on anticoagulation, diabetes, hyperlipidemia, COPD/asthma, OSA, BPH with chronic Foley, HTN, HLD, GERD, pulmonary hypertension, IDA, PUD, seizures, diverticulosis, question of underlying dementia and recent admission 4/11 - 5/15 with hepatic encephalopathy and GI bleeding (found to have portal hypertensive gastropathy and gastric and duodenal AVM s/p APC) who is admitted with FTT, AMS and decompensated cirrhosis.  RD working remotely.  Pt is known to the nutrition department from a recent previous admission. Pt with poor appetite and oral intake during his last admission; pt was eating <50% of meals. Pt with reported dysphagia at SNF. Pt seen by SLP today and remains NPO secondary to lethargy. NGT in place and is noted gastric. Plan is to initiate tube feeds today. Pt is actively refeeding; electrolytes are being  monitored. Recommend repletion of phosphorus prior to initiation of tube feeds; this was discussed with MD. Per chart, pt appears to be down ~51lbs(25%) over the past 4 months; this is significant weight loss. Palliative care consult is pending. Pt diagnosed with severe malnutrition during his last admission and likely still meets criteria. RD will obtain nfpe and exam at follow up.     Medications reviewed and include: folic acid , insulin , lactulose , melatonin, midodrine , MVI, protonix , thiamine, NaCl w/ 5% dextrose  & KCl @75ml /hr  Labs reviewed: Na 148(H), K 3.3(L), P 2.0(L), Mg 2.1 wnl Ammonia- 68(H) Cbgs- 332, 378 x 24 hrs  AIC 4.4(L)- 08/2023  NUTRITION - FOCUSED PHYSICAL EXAM: Unable to perform at this time   Diet Order:   Diet Order             Diet regular Room service appropriate? Yes; Fluid consistency: Thin  Diet effective now                  EDUCATION NEEDS:   No education needs have been identified at this time  Skin:  Skin Assessment: Reviewed RN Assessment (Stage II sacrum)  Last BM:  pta  Height:   Ht Readings from Last 1 Encounters:  02/01/24 5\' 8"  (1.727 m)    Weight:   Wt Readings from Last 1 Encounters:  02/01/24 69.1 kg    Ideal Body Weight:  70 kg  BMI:  Body mass index is 23.16 kg/m.  Estimated Nutritional Needs:   Kcal:  1900-2200kcal/day  Protein:  95-110g/day  Fluid:  1.7-2.0L/day  Torrance Freestone MS, RD, LDN If unable to be reached, please send  secure chat to "RD inpatient" available from 8:00a-4:00p daily

## 2024-02-01 NOTE — Consult Note (Signed)
 Consultation Note Date: 02/01/2024   Patient Name: Tony Cantu  DOB: 01/15/1951  MRN: [id number]  Age / Sex: 73 y.o., male  PCP: Tony Guerin, DO Referring Physician: Justina Oman, MD  Reason for Consultation: Establishing goals of care  HPI/Patient Profile: 73 y.o. male  with past medical history of NASH cirrhosis, HFpEF, moderate AS, paroxysmal A-fib on anticoagulation, diabetes, HLD, COPD/asthma, OSA, BPH with chronic foley, question of underlying dementia, failure to thrive, recent admission 4/11-5/15 with hepatic encephalopathy and GIB with portal hypertensive gastropathy and gastric/duodenal AVM s/p APC admitted from [facility] on 01/31/2024 with lethargy and decreased mental status with ammonia 113.   Clinical Assessment and Goals of Care: Consult received and extensive chart review completed. I have reviewed MOST form completed in Jan 2025. I have reviewed past hospitalization when he was DNR status - I can not locate any specific documentation for DNR conversation to know if this was determined by patient or HCPOA. I met today with Tony Cantu but no visitors to bedside. He opens eyes to command and makes attempt to speak but speech is mumbled and I'm unable to understand. Discussed with RN. NGT in place for medication administration.   I have called multiple times and left voicemails with HCPOA but have not heard back from Tony Cantu. RN shares that Tony Cantu was at bedside and she verified contact numbers. Unfortunately Tony Cantu was gone from bedside by the time I learned she was here. There is need for in depth conversation regarding goals of care. I am concerned with failure to thrive and high risk for further decompensation in the future in the setting of decompensated cirrhosis. I will follow up Monday 6/9 with hopes that Tony Cantu will be more awake and able to participate in goals of care conversations to  further clarify his wishes.   Updated RN and Dr. Michaelene Cantu.   Primary Decision Maker HCPOA Tony Cantu    SUMMARY OF RECOMMENDATIONS   - Goals and code status unclear - unable to reach HCPOA to discuss  Code Status/Advance Care Planning: Full code ?   Symptom Management:  Per attending  Prognosis:  Overall prognosis poor.   Discharge Planning: To Be Determined      Primary Diagnoses: Present on Admission:  Encephalopathy  Hepatic encephalopathy (HCC)   I have reviewed the medical record, interviewed the patient and family, and examined the patient. The following aspects are pertinent.  Past Medical History:  Diagnosis Date   Acute renal injury (HCC) 08/06/2016   Aortic atherosclerosis (HCC)    Aortic stenosis    Arthritis    Asthma    BPH (benign prostatic hypertrophy)    Cirrhosis (HCC) 2016   Colon polyps    Diabetes mellitus without complication (HCC)    TYPE 1    Diverticulosis    DKA (diabetic ketoacidosis) (HCC) 10/25/2023   Dysrhythmia 04/19/2021   A-fib noted with possible A-fib RVR   Gastric ulcer    Gastric varices    right   Gastritis  GERD (gastroesophageal reflux disease)    Headache    History- pt states these were migraines that occurred in the 1980's   Heart murmur    History of kidney stones    Hx of adenomatous colonic polyps    Hypertension    Iron  deficiency anemia due to chronic blood loss 08/16/2023   Melena 09/20/2023   Portal hypertensive gastropathy (HCC)    Postoperative fever 03/10/2022   Renal cyst, right    Seizures (HCC)    HYPOGLYCEMIC LAST 1 AND 1/2 YRS AGO   Sepsis (HCC) 09/20/2023   Sleep apnea    uses CPAP nightly   Stroke-like symptom 09/20/2023   Testicle trouble    one testicle BORN WITH   Tubular adenoma of colon    Social History   Socioeconomic History   Marital status: Legally Separated    Spouse name: Not on file   Number of children: 1   Years of education: Not on file   Highest education level: Not  on file  Occupational History   Occupation: retired   Tobacco Use   Smoking status: Former    Current packs/day: 0.00    Types: Cigarettes, Pipe    Start date: 1971    Quit date: 2005    Years since quitting: 20.4   Smokeless tobacco: Never   Tobacco comments:    quit 2005 smoked cigarettes for 5 yrs prior to pipe use  Vaping Use   Vaping status: Never Used  Substance and Sexual Activity   Alcohol use: Not Currently   Drug use: No   Sexual activity: Yes  Other Topics Concern   Not on file  Social History Narrative   ** Merged History Encounter **    Right handed    Social Drivers of Health   Financial Resource Strain: Low Risk  (06/09/2022)   Overall Financial Resource Strain (CARDIA)    Difficulty of Paying Living Expenses: Not hard at all  Food Insecurity: Patient Unable To Answer (12/09/2023)   Hunger Vital Sign    Worried About Running Out of Food in the Last Year: Patient unable to answer    Ran Out of Food in the Last Year: Patient unable to answer  Transportation Needs: Patient Unable To Answer (12/09/2023)   PRAPARE - Transportation    Lack of Transportation (Medical): Patient unable to answer    Lack of Transportation (Non-Medical): Patient unable to answer  Physical Activity: Not on file  Stress: Not on file  Social Connections: Patient Unable To Answer (12/09/2023)   Social Connection and Isolation Panel [NHANES]    Frequency of Communication with Friends and Family: Patient unable to answer    Frequency of Social Gatherings with Friends and Family: Patient unable to answer    Attends Religious Services: Patient unable to answer    Active Member of Clubs or Organizations: Patient unable to answer    Attends Banker Meetings: Patient unable to answer    Marital Status: Patient unable to answer  Recent Concern: Social Connections - Socially Isolated (10/30/2023)   Social Connection and Isolation Panel [NHANES]    Frequency of Communication with  Friends and Family: Never    Frequency of Social Gatherings with Friends and Family: Never    Attends Religious Services: More than 4 times per year    Active Member of [employer] or Organizations: No    Attends Banker Meetings: Never    Marital Status: Separated   Family History  Problem  Relation Age of Onset   Heart failure Mother    Heart attack Mother        in her 62's   Alzheimer's disease Mother    Diabetes Mother    Asthma Father    Suicidality Father        in his 41's   Allergic rhinitis Sister    Heart failure Maternal Grandmother    Rheum arthritis Maternal Grandmother    Breast cancer Maternal Grandmother    Heart attack Maternal Grandmother        in her 65's   Colon cancer Neg Hx    Esophageal cancer Neg Hx    Pancreatic cancer Neg Hx    Liver disease Neg Hx    Scheduled Meds:  amiodarone   100 mg Per Tube Daily   apixaban   5 mg Per Tube BID   Chlorhexidine  Gluconate Cloth  6 each Topical Q0600   feeding supplement  237 mL Oral BID BM   folic acid   1 mg Oral Daily   insulin  aspart  0-6 Units Subcutaneous TID WC   lactulose   30 g Per Tube QID   melatonin  6 mg Per Tube QHS   midodrine   10 mg Per Tube TID with meals   montelukast   10 mg Per Tube QHS   multivitamin with minerals  1 tablet Per Tube Daily   pantoprazole  (PROTONIX ) IV  40 mg Intravenous Q12H   rifaximin   550 mg Per Tube BID   sodium chloride  flush  3 mL Intravenous Q12H   thiamine  100 mg Per Tube Daily   Continuous Infusions:  dextrose  75 mL/hr at 02/01/24 0304   lactated ringers      PRN Meds:.acetaminophen , albuterol  Allergies  Allergen Reactions   Ak-Mycin [Erythromycin] Diarrhea   Dimetapp Children's Cold-Cough Other (See Comments)    Chest discomfort    Sulfonamide Derivatives Diarrhea   Review of Systems  Unable to perform ROS: Acuity of condition    Physical Exam Vitals and nursing note reviewed.  Constitutional:      General: He is not in acute distress.     Appearance: He is ill-appearing.     Comments: Thin, frail   Cardiovascular:     Rate and Rhythm: Normal rate.  Pulmonary:     Effort: No tachypnea, accessory muscle usage or respiratory distress.  Abdominal:     Palpations: Abdomen is soft.  Neurological:     Mental Status: He is lethargic.     Comments: Opens eyes to command but does not follow other commands     Vital Signs: BP (!) 100/55   Pulse 67   Temp 97.8 F (36.6 C) (Axillary)   Resp 12   Ht 5\' 8"  (1.727 m)   Wt 69.1 kg   SpO2 100%   BMI 23.16 kg/m  Pain Scale: PAINAD   Pain Score: 0-No pain   SpO2: SpO2: 100 % O2 Device:SpO2: 100 % O2 Flow Rate: .   IO: Intake/output summary:  Intake/Output Summary (Last 24 hours) at 02/01/2024 0908 Last data filed at 02/01/2024 0500 Gross per 24 hour  Intake 3212.8 ml  Output 925 ml  Net 2287.8 ml    LBM:   Baseline Weight: Weight: 69.1 kg Most recent weight: Weight: 69.1 kg     Palliative Assessment/Data:    Time Total: 55 min  Greater than 50%  of this time was spent counseling and coordinating care related to the above assessment and plan.  Signed by: Vila Grayer,  NP Palliative Medicine Team Pager # [phone] (M-F 8a-5p) Team Phone # [phone] (Nights/Weekends)

## 2024-02-01 NOTE — TOC Initial Note (Signed)
 Transition of Care [facility]) - Initial/Assessment Note    Patient Details  Name: Tony Cantu MRN: [id number] Date of Birth: Nov 07, 1950  Transition of Care [facility]) CM/SW Contact:    Orelia Binet, RN Phone Number: 02/01/2024, 3:01 PM  Clinical Narrative:     Patient admitted with encephalopathy. Patient recently at [facility] ([facility]) and discharged to [facility]. Medical work up continues. [facility] updated. They are checking to see if he needs Auth to return. Palliative consulted.               Expected Discharge Plan: Skilled Nursing Facility Barriers to Discharge: Continued Medical Work up   Patient Goals and CMS Choice Patient states their goals for this hospitalization and ongoing recovery are:: return to SNF          Expected Discharge Plan and Services       Living arrangements for the past 2 months: Skilled Nursing Facility                                      Prior Living Arrangements/Services Living arrangements for the past 2 months: Skilled Nursing Facility Lives with:: Facility Resident Patient language and need for interpreter reviewed:: Yes        Need for Family Participation in Patient Care: Yes (Comment) Care giver support system in place?: Yes (comment)   Criminal Activity/Legal Involvement Pertinent to Current Situation/Hospitalization: No - Comment as needed  Activities of Daily Living      Permission Sought/Granted                  Emotional Assessment   Attitude/Demeanor/Rapport: Unable to Assess     Alcohol / Substance Use: Not Applicable Psych Involvement: No (comment)  Admission diagnosis:  Hepatic encephalopathy (HCC) [K76.82] Hypokalemia [E87.6] Hypernatremia [E87.0] Hypoglycemia [E16.2] Thrombocytopenia (HCC) [D69.6] Encephalopathy [G93.40] SIRS (systemic inflammatory response syndrome) (HCC) [R65.10] Patient Active Problem List   Diagnosis Date Noted   Dehydration 02/01/2024   Hypernatremia 02/01/2024    Hypokalemia 02/01/2024   Failure to thrive in adult 02/01/2024   Encephalopathy 01/31/2024   Altered mental status 12/12/2023   Hepatic encephalopathy (HCC) 12/09/2023   ABLA (acute blood loss anemia) 12/09/2023   Acute GI bleeding 12/08/2023   Atrial fibrillation with rapid ventricular response (HCC) 10/26/2023   Protein-calorie malnutrition, severe 10/25/2023   GI bleed 10/24/2023   Hemorrhagic shock (HCC) 10/24/2023   Other cirrhosis of liver (HCC) 09/25/2023   Abnormal finding on GI tract imaging 09/25/2023   Esophageal candidiasis (HCC) 09/25/2023   Bacteremia 09/21/2023   Acute encephalopathy 09/20/2023   Transaminitis 09/20/2023   Renal lesion 09/20/2023   Melena 09/20/2023   Iron  deficiency anemia due to chronic blood loss 08/16/2023   NAFLD (nonalcoholic fatty liver disease) 95/62/1308   Portal hypertension (HCC) 08/24/2022   S/P total knee arthroplasty, left 06/06/2022   Degenerative arthritis of left knee 06/03/2022   Chronic obstructive pulmonary disease (HCC) 05/10/2022   Grade I diastolic dysfunction 03/11/2022   S/P TKR (total knee replacement), right 03/07/2022   Osteoarthritis of right knee 03/04/2022   Precordial chest pain 04/20/2021   Former smoker 01/12/2021   PAF (paroxysmal atrial fibrillation) (HCC) 06/22/2020   Hypertension associated with type 2 diabetes mellitus (HCC) 05/17/2019   Hyperlipidemia associated with type 2 diabetes mellitus (HCC) 05/17/2019   GERD with esophagitis 05/17/2019   H/O total shoulder replacement, right 11/01/2018   Paroxysmal tachycardia (  HCC) 01/11/2017   Nonrheumatic aortic valve stenosis 01/11/2017   Aortic atherosclerosis (HCC) 11/23/2016   HNP (herniated nucleus pulposus), lumbar 10/06/2016   Diarrhea 08/06/2016   Fever 08/06/2016   Hyperbilirubinemia 08/06/2016   Shock circulatory (HCC) 08/06/2016   Inguinal hernia 12/10/2015   Right groin pain 11/30/2015   Carpal tunnel syndrome on right 09/09/2015   Cervical  spondylosis without myelopathy 09/09/2015   Thrombocytopenia (HCC) 07/21/2014   Bilateral carotid bruits 05/11/2014   Vitamin D  deficiency 09/17/2013   BPH (benign prostatic hyperplasia) 05/22/2013   Low serum testosterone  level 02/18/2013   Diabetes type 2, controlled (HCC) 01/10/2013   OSA (obstructive sleep apnea) 05/16/2012   Edema 02/21/2012   At risk for coronary artery disease 03/20/2011   Obesity 03/20/2011   Allergic rhinitis 06/22/2010   Asthma 06/22/2010   COUGH 06/22/2010   PCP:  [name], DO Pharmacy:   [facility] [place], [place] - [address] [place] [address] Phone: [phone] Fax: [phone]  [facility] - [place] - [address] [place]  [address] Phone: [phone] Fax: [phone]  [facility] - [place], [place] - [address] [place] [address] Phone: [phone] Fax: [phone]  [facility] [address] [place] [address] Phone: [phone] Fax: [phone]     Social Drivers of Health (SDOH) Social History: SDOH Screenings   Food Insecurity: Patient Unable To Answer (12/09/2023)  Housing: Patient Unable To Answer (12/09/2023)  Recent Concern: Housing - High Risk (10/30/2023)  Transportation Needs: Patient Unable To Answer (12/09/2023)  Utilities: Patient Unable To Answer (12/09/2023)  Depression (PHQ2-9): Medium Risk (09/13/2023)  Financial Resource Strain: Low Risk  (06/09/2022)  Social Connections: Patient Unable To Answer (12/09/2023)  Recent Concern: Social Connections - Socially Isolated (10/30/2023)  Tobacco Use: Medium Risk (12/08/2023)   SDOH Interventions:     Readmission Risk Interventions    02/01/2024    3:00 PM  Readmission Risk Prevention Plan  Transportation Screening Complete  Medication Review (RN Care Manager) Complete   PCP or Specialist appointment within 3-5 days of discharge Not Complete  HRI or Home Care Consult Complete  SW Recovery Care/Counseling Consult Complete  Palliative Care Screening Not Complete  Skilled Nursing Facility Complete

## 2024-02-01 NOTE — Progress Notes (Signed)
**Note Tony-Identified via Obfuscation**  Progress Note   Patient: Tony Cantu YNW:[id number] DOB: 1950-11-01 DOA: 01/31/2024     1 DOS: the patient was seen and examined on 02/01/2024   Brief hospital admission narrative:  As per H&P written by Dr. Amy Kansky on 01/31/2024  Tony Cantu is a 73 y.o. male with hx of decompensated Nash cirrhosis, HFpEF, paroxysmal A-fib on anticoagulation, diabetes, hyperlipidemia, COPD/asthma, OSA, BPH with chronic Foley, question of underlying dementia, failure to thrive, recent admission 4/11 - 5/15 with hepatic encephalopathy and GI bleeding found to have portal hypertensive gastropathy and gastric and duodenal AVM s/p APC.  Was brought in from his SNF at [facility] for increasing lethargy and altered mental status.  History is limited, per triage notes decreased p.o. times few days and increasing lethargy.  Facility did lab work with ammonia level of 113 so sent to the ED.   Discussed with his ex wife and POA [name] who was notified by SNF of his status and plan for hospitalization. She notes concern about ability to take in PO and limited access to food / water  and him not requesting from staff. Also reports having possible dysphagia / things getting "hung up" when he swallows. Concern about patient refusal of meds and impaired capacity during periods of encephalopathy.   Assessment and plan 1-metabolic encephalopathy: Multifactorial - In the setting of hepatic encephalopathy with elevated ammonia level; hypoglycemia and hypernatremia. - No acute source of infection appreciated - Report from the skilled nursing facility expressing decreased oral intake and refusal of several his medications for the last 48 to 72 hours. Patient became significantly more lethargic and was not able to follow commands. - NG tube placed to facilitate resumption of medications and treatment with ammonia - Ammonia level trending down; patient was able to say yes or no with his head while answering questions but is still having  gibberish speech. - Continue supportive care - Continue electrolyte repletion and avoid hypoglycemia. - After discussing with dietitian tube feedings will be started and will follow/replete phosphorus preventing refeeding syndrome. - Follow clinical response.  2-patient with decompensated Tony Cantu cirrhosis - Continue treatment as mentioned above with lactulose , rifaximin  and will continue PPI. - Patient on midodrine  3 times a day - Continue to follow response - Palliative care has been consulted for clarification regarding advance care planning and goals of care.  3-hyponatremia/dehydration - Continue to maintain adequate hydration - Starting free water  through NG tube now that he will be getting tube feedings. - Continue to follow ultralights trend.  4-severe hypokalemia - Continue supplementation/repletion and follow electrolytes trend - Continue telemetry monitoring.  5-type 2 diabetes mellitus with hypoglycemia - In the setting of poor oral intake and failure to thrive - Reverted after receiving D5 - Continue to follow CBG fluctuation and use sliding scale insulin  as needed - Tube feedings will be started  6-chronic diastolic heart failure - Stable and compensated - Last echo with ejection fraction 60 to 65% - Continue to follow daily weights and strict intake and output - Paroxysmal atrial fibrillation - Currently rate controlled - Continue amiodarone  and the use of Eliquis . - Initially holding anticoagulation while awaiting CT head results; no signs of any hemorrhagic changes.  7-chronic BPH - Patient with chronic Foley in place; this has been exchanged in the ED at time of admission - There was no signs of UTI.  8-COPD/asthma - No wheezing on exam - Continue bronchodilator management.  9-stage II pressure injury - Appreciated in his sacrum - Present  at time of admission - No signs of superimposed infection.  10-chronic thrombocytopenia - No overt bleeding  appreciated - Continue to follow platelet count - Associated with cirrhosis history.   Subjective:  Still obtunded even able to say yes or no with his head while answering questions.  NG tube in place and unsafe to resume oral route for nutrition and medications at the moment.  Physical Exam: Vitals:   02/01/24 0827 02/01/24 0830 02/01/24 1125 02/01/24 1230  BP: (!) 99/59 (!) 100/55  (!) 139/43  Pulse: 72 67  62  Resp: 15 12  19   Temp:   97.7 F (36.5 C)   TempSrc:   Axillary   SpO2: 100% 100%  100%  Weight:      Height:       General exam: Lethargic/obtunded; intermittently answering yes or no questions and demonstrating difficulty following commands. Respiratory system: Clear to auscultation. Respiratory effort normal. Cardiovascular system:RRR. No rub or gallops; positive systolic murmur. Gastrointestinal system: Abdomen is nondistended, soft and nontender. No organomegaly or masses felt. Normal bowel sounds heard. Central nervous system: Able to move limbs spontaneously; no focal deficits appreciated.  Limited examination with encephalopathy. Extremities: No cyanosis or clubbing. Skin: No petechiae.  Stage II pressure injury in sacral area present at time of admission without signs of superimposed infection. Psychiatry: Judgement and insight unable to properly assess with current encephalopathy; flat affect.  Data Reviewed: Ammonia: 68 TSH:1.077 B12:1685 CBC: White blood cell 6.4, hemoglobin 11.3 and platelet count 86K  Family Communication: No family at bedside.  Disposition: Status is: Inpatient Remains inpatient appropriate because: Continue IV therapy.  Dissipating discharge back to skilled nursing facility once medically stable.  Time spent: 50 minutes  Author: Justina Oman, MD 02/01/2024 3:08 PM  For on call review [email or web address].

## 2024-02-02 DIAGNOSIS — E876 Hypokalemia: Secondary | ICD-10-CM

## 2024-02-02 DIAGNOSIS — D696 Thrombocytopenia, unspecified: Secondary | ICD-10-CM | POA: Diagnosis not present

## 2024-02-02 DIAGNOSIS — E870 Hyperosmolality and hypernatremia: Secondary | ICD-10-CM

## 2024-02-02 DIAGNOSIS — K7682 Hepatic encephalopathy: Secondary | ICD-10-CM

## 2024-02-02 LAB — CBC
HCT: 34 % — ABNORMAL LOW (ref 39.0–52.0)
Hemoglobin: 11 g/dL — ABNORMAL LOW (ref 13.0–17.0)
MCH: 36.3 pg — ABNORMAL HIGH (ref 26.0–34.0)
MCHC: 32.4 g/dL (ref 30.0–36.0)
MCV: 112.2 fL — ABNORMAL HIGH (ref 80.0–100.0)
Platelets: 82 10*3/uL — ABNORMAL LOW (ref 150–400)
RBC: 3.03 MIL/uL — ABNORMAL LOW (ref 4.22–5.81)
RDW: 17.4 % — ABNORMAL HIGH (ref 11.5–15.5)
WBC: 5.9 10*3/uL (ref 4.0–10.5)
nRBC: 0 % (ref 0.0–0.2)

## 2024-02-02 LAB — GLUCOSE, CAPILLARY
Glucose-Capillary: 367 mg/dL — ABNORMAL HIGH (ref 70–99)
Glucose-Capillary: 387 mg/dL — ABNORMAL HIGH (ref 70–99)
Glucose-Capillary: 425 mg/dL — ABNORMAL HIGH (ref 70–99)
Glucose-Capillary: 443 mg/dL — ABNORMAL HIGH (ref 70–99)
Glucose-Capillary: 476 mg/dL — ABNORMAL HIGH (ref 70–99)
Glucose-Capillary: 487 mg/dL — ABNORMAL HIGH (ref 70–99)
Glucose-Capillary: 493 mg/dL — ABNORMAL HIGH (ref 70–99)

## 2024-02-02 LAB — COMPREHENSIVE METABOLIC PANEL WITH GFR
ALT: 39 U/L (ref 0–44)
AST: 73 U/L — ABNORMAL HIGH (ref 15–41)
Albumin: 2.2 g/dL — ABNORMAL LOW (ref 3.5–5.0)
Alkaline Phosphatase: 120 U/L (ref 38–126)
Anion gap: 11 (ref 5–15)
BUN: 11 mg/dL (ref 8–23)
CO2: 24 mmol/L (ref 22–32)
Calcium: 9.2 mg/dL (ref 8.9–10.3)
Chloride: 111 mmol/L (ref 98–111)
Creatinine, Ser: 0.76 mg/dL (ref 0.61–1.24)
GFR, Estimated: >60 mL/min (ref >60)
Glucose, Bld: 539 mg/dL (ref 70–99)
Potassium: 3.2 mmol/L — ABNORMAL LOW (ref 3.5–5.1)
Sodium: 146 mmol/L — ABNORMAL HIGH (ref 135–145)
Total Bilirubin: 0.6 mg/dL (ref 0.0–1.2)
Total Protein: 5.3 g/dL — ABNORMAL LOW (ref 6.5–8.1)

## 2024-02-02 LAB — URINE CULTURE: Culture: NO GROWTH

## 2024-02-02 LAB — PHOSPHORUS: Phosphorus: 2.2 mg/dL — ABNORMAL LOW (ref 2.5–4.6)

## 2024-02-02 LAB — MAGNESIUM: Magnesium: 2 mg/dL (ref 1.7–2.4)

## 2024-02-02 LAB — AMMONIA: Ammonia: 56 umol/L — ABNORMAL HIGH (ref 9–35)

## 2024-02-02 MED ORDER — INSULIN ASPART 100 UNIT/ML IJ SOLN
15.0000 [IU] | Freq: Once | INTRAMUSCULAR | Status: AC
  Administered 2024-02-02: 15 [IU] via SUBCUTANEOUS

## 2024-02-02 MED ORDER — INSULIN ASPART 100 UNIT/ML IJ SOLN
0.0000 [IU] | INTRAMUSCULAR | Status: DC
  Administered 2024-02-02 – 2024-02-03 (×5): 20 [IU] via SUBCUTANEOUS
  Administered 2024-02-03 (×2): 4 [IU] via SUBCUTANEOUS
  Administered 2024-02-03: 20 [IU] via SUBCUTANEOUS
  Administered 2024-02-03: 11 [IU] via SUBCUTANEOUS
  Administered 2024-02-04: 2 [IU] via SUBCUTANEOUS
  Administered 2024-02-04: 7 [IU] via SUBCUTANEOUS
  Administered 2024-02-04: 4 [IU] via SUBCUTANEOUS
  Administered 2024-02-04: 11 [IU] via SUBCUTANEOUS
  Administered 2024-02-04: 7 [IU] via SUBCUTANEOUS
  Administered 2024-02-04 – 2024-02-05 (×2): 15 [IU] via SUBCUTANEOUS
  Administered 2024-02-05: 11 [IU] via SUBCUTANEOUS
  Administered 2024-02-05 (×2): 4 [IU] via SUBCUTANEOUS
  Administered 2024-02-05: 3 [IU] via SUBCUTANEOUS
  Administered 2024-02-05: 11 [IU] via SUBCUTANEOUS

## 2024-02-02 MED ORDER — INSULIN GLARGINE-YFGN 100 UNIT/ML ~~LOC~~ SOLN
20.0000 [IU] | Freq: Every day | SUBCUTANEOUS | Status: DC
  Administered 2024-02-02: 20 [IU] via SUBCUTANEOUS
  Filled 2024-02-02 (×2): qty 0.2

## 2024-02-02 MED ORDER — INSULIN GLARGINE-YFGN 100 UNIT/ML ~~LOC~~ SOLN
15.0000 [IU] | Freq: Two times a day (BID) | SUBCUTANEOUS | Status: DC
  Administered 2024-02-02 – 2024-02-03 (×2): 15 [IU] via SUBCUTANEOUS
  Filled 2024-02-02 (×4): qty 0.15

## 2024-02-02 MED ORDER — POTASSIUM CHLORIDE 20 MEQ PO PACK
40.0000 meq | PACK | Freq: Once | ORAL | Status: AC
  Administered 2024-02-02: 40 meq
  Filled 2024-02-02: qty 2

## 2024-02-02 NOTE — Progress Notes (Signed)
 [facility] PROGRESS NOTE   Tony Cantu ZOX:[id number] DOB: 1951/05/19 DOA: 01/31/2024  PCP: [name], DO  Brief History: As per H&P written by Dr. Amy Kansky on 01/31/2024  Tony Cantu is a 73 y.o. male with hx of decompensated Nash cirrhosis, HFpEF, paroxysmal A-fib on anticoagulation, diabetes, hyperlipidemia, COPD/asthma, OSA, BPH with chronic Foley, question of underlying dementia, failure to thrive, recent admission 4/11 - 5/15 with hepatic encephalopathy and GI bleeding found to have portal hypertensive gastropathy and gastric and duodenal AVM s/p APC.  Was brought in from his SNF at [facility] for increasing lethargy and altered mental status.  History is limited, per triage notes decreased p.o. times few days and increasing lethargy.  Facility did lab work with ammonia level of 113 so sent to the ED.  Consultants: Palliative care  Procedures: None    Subjective/Interval History: Patient lethargic but easily arousable.  Answers questions.  Remains disoriented.  Denies any pain.    Assessment/Plan:  Acute metabolic encephalopathy: Multifactorial - In the setting of hepatic encephalopathy with elevated ammonia level; hypoglycemia and hypernatremia. - No acute source of infection appreciated - Report from the skilled nursing facility expressing decreased oral intake and refusal of several his medications for the last 48 to 72 hours. Patient became significantly more lethargic and was not able to follow commands. - NG tube placed to facilitate resumption of medications  Patient noted to be on rifaximin  and lactulose .  Having multiple bowel movements. Ammonia level is trending down Mentation appears to have improved.  Poor oral intake Secondary to encephalopathy.  NG tube was placed and started on tube feedings.  Seen by speech therapy and cleared for oral intake.  Will see how he does with oral intake over the next 24 to 48 hours before deciding to  discontinue with tube feedings.  Decompensated Nash cirrhosis - Continue treatment as mentioned above with lactulose , rifaximin  and will continue PPI. - Patient on midodrine  3 times a day - Palliative care has been consulted for clarification regarding advance care planning and goals of care.  Hypernatremia Continue with free water .  Improvements in sodium levels noted.  Hypokalemia Potassium level has improved but remains low.  Continue supplementation. Magnesium  is 2.0.  Type 2 diabetes mellitus with hypoglycemia Initially noted to have hypoglycemia.  This was secondary to poor oral intake. Now hyperglycemic.  Likely due to tube feedings.  A1c was 4.4 in January. Home medication list reviewed.  Supposedly on glargine insulin  prior to admission.  Will change SSI to every 4 hours and increase the dose of his glargine.  Chronic diastolic heart failure - Stable and compensated - Last echo with ejection fraction 60 to 65%  Paroxysmal atrial fibrillation - Currently rate controlled - Continue amiodarone  and the use of Eliquis .  Chronic BPH - Patient with chronic Foley in place; this has been exchanged in the ED at time of admission - There was no signs of UTI.  COPD/asthma - No wheezing on exam - Continue bronchodilator management.  Stage II pressure injury - Appreciated in his sacrum - Present at time of admission - No signs of superimposed infection.  Chronic thrombocytopenia - No overt bleeding appreciated - Associated with cirrhosis history.  DVT Prophylaxis: On apixaban  Code Status: Full code Family Communication: No family at bedside Disposition Plan: SNF when medically stable     Medications: Scheduled:  amiodarone   100 mg Per Tube Daily   apixaban   5 mg Per Tube BID   Chlorhexidine   Gluconate Cloth  6 each Topical Q0600   feeding supplement  237 mL Oral TID BM   feeding supplement (PROSource TF20)  60 mL Per Tube Daily   folic acid   1 mg Oral Daily   free  water   200 mL Per Tube Q4H   insulin  aspart  0-15 Units Subcutaneous TID WC   insulin  aspart  0-5 Units Subcutaneous QHS   insulin  glargine-yfgn  5 Units Subcutaneous Daily   lactulose   30 g Per Tube QID   melatonin  6 mg Per Tube QHS   midodrine   10 mg Per Tube TID with meals   montelukast   10 mg Per Tube QHS   multivitamin with minerals  1 tablet Per Tube Daily   nutrition supplement (JUVEN)  1 packet Per Tube BID BM   mouth rinse  15 mL Mouth Rinse 4 times per day   pantoprazole  (PROTONIX ) IV  40 mg Intravenous Q12H   rifaximin   550 mg Per Tube BID   sodium chloride  flush  3 mL Intravenous Q12H   thiamine  100 mg Per Tube Daily   Continuous:  feeding supplement (OSMOLITE 1.5 CAL) 60 mL/hr at 02/02/24 0827   PRN:acetaminophen , albuterol , mouth rinse  Antibiotics: Anti-infectives (From admission, onward)    Start     Dose/Rate Route Frequency Ordered Stop   01/31/24 2315  rifaximin  (XIFAXAN ) tablet 550 mg        550 mg Per Tube 2 times daily 01/31/24 2304     01/31/24 2115  ceFEPIme  (MAXIPIME ) 2 g in sodium chloride  0.9 % 100 mL IVPB        2 g 200 mL/hr over 30 Minutes Intravenous  Once 01/31/24 2105 01/31/24 2200   01/31/24 2115  metroNIDAZOLE  (FLAGYL ) IVPB 500 mg        500 mg 100 mL/hr over 60 Minutes Intravenous  Once 01/31/24 2105 01/31/24 2302   01/31/24 2115  vancomycin  (VANCOCIN ) IVPB 1000 mg/200 mL premix        1,000 mg 200 mL/hr over 60 Minutes Intravenous  Once 01/31/24 2105 02/01/24 0022       Objective:  Vital Signs  Vitals:   02/02/24 0430 02/02/24 0500 02/02/24 0605 02/02/24 0755  BP: 99/67 109/65 109/64   Pulse: 71 67 71   Resp: 19 18 20    Temp:    97.8 F (36.6 C)  TempSrc:    Oral  SpO2:      Weight:      Height:        Intake/Output Summary (Last 24 hours) at 02/02/2024 0948 Last data filed at 02/02/2024 0839 Gross per 24 hour  Intake 3648.97 ml  Output 1550 ml  Net 2098.97 ml   Filed Weights   02/01/24 0100 02/02/24 0413  Weight:  69.1 kg 74.6 kg    General appearance: Awake alert.  In no distress.  Lethargic NG tube is noted Resp: Clear to auscultation bilaterally.  Normal effort Cardio: S1-S2 is normal regular.  No S3-S4.  No rubs murmurs or bruit GI: Abdomen is soft.  Nontender nondistended.  Bowel sounds are present normal.  No masses organomegaly Extremities: No edema.  Able to move his extremities. He remains disoriented.  No focal neurological deficits noted.  Lab Results:  Data Reviewed: I have personally reviewed following labs and reports of the imaging studies  CBC: Recent Labs  Lab 01/26/24 1227 01/31/24 2125 02/01/24 0418 02/02/24 0439  WBC 5.0 7.5 6.4 5.9  NEUTROABS 3.1 4.2  --   --  HGB 11.7* 12.7* 11.3* 11.0*  HCT 37.4* 37.5* 35.5* 34.0*  MCV 111.0* 106.5* 109.9* 112.2*  PLT 118* 111* 86* 82*    Basic Metabolic Panel: Recent Labs  Lab 01/26/24 1227 01/31/24 2125 02/01/24 0418 02/02/24 0439  NA 142 152* 148* 146*  K 3.1* 2.2* 3.3* 3.2*  CL 109 113* 112* 111  CO2 22 28 26 24   GLUCOSE 433* 62* 364* 539*  BUN 14 17 14 11   CREATININE 0.92 0.93 0.86 0.76  CALCIUM  9.9 10.8* 10.1 9.2  MG  --  2.5* 2.1 2.0  PHOS  --   --  2.0* 2.2*    GFR: Estimated Creatinine Clearance: 78.4 mL/min (by C-G formula based on SCr of 0.76 mg/dL).  Liver Function Tests: Recent Labs  Lab 01/26/24 1227 01/31/24 2125 02/01/24 0418 02/02/24 0439  AST 44* 53* 59* 73*  ALT 28 35 34 39  ALKPHOS 109 113 105 120  BILITOT 1.4* 1.2 1.3* 0.6  PROT 6.3* 6.4* 5.7* 5.3*  ALBUMIN  2.7* 2.7* 2.4* 2.2*     Recent Labs  Lab 01/31/24 2125 02/01/24 0418 02/02/24 0439  AMMONIA 100* 68* 56*    Coagulation Profile: Recent Labs  Lab 01/31/24 2125  INR 1.5*    CBG: Recent Labs  Lab 02/01/24 2126 02/02/24 0035 02/02/24 0337 02/02/24 0651 02/02/24 0729  GLUCAP 556* 443* 493* 476* 487*    Thyroid  Function Tests: Recent Labs    02/01/24 0418  TSH 1.077    Anemia Panel: Recent Labs     02/01/24 0418  VITAMINB12 1,685*  FOLATE 7.1    Recent Results (from the past 240 hours)  Urine Culture     Status: Abnormal   Collection Time: 01/26/24 12:00 PM   Specimen: Urine, Random  Result Value Ref Range Status   Specimen Description   Final    URINE, RANDOM Performed at [facility], [address], [place], [place] [address]    Special Requests   Final    NONE Reflexed from [id number] Performed at [facility], [address], [place], [place] [address]    Culture >=100,000 COLONIES/mL ESCHERICHIA COLI (A)  Final   Report Status 01/28/2024 FINAL  Final   Organism ID, Bacteria ESCHERICHIA COLI (A)  Final      Susceptibility   Escherichia coli - MIC*    AMPICILLIN >=32 RESISTANT Resistant     CEFAZOLIN  8 SENSITIVE Sensitive     CEFEPIME  <=0.12 SENSITIVE Sensitive     CEFTRIAXONE  <=0.25 SENSITIVE Sensitive     CIPROFLOXACIN  >=4 RESISTANT Resistant     GENTAMICIN <=1 SENSITIVE Sensitive     IMIPENEM <=0.25 SENSITIVE Sensitive     NITROFURANTOIN <=16 SENSITIVE Sensitive     TRIMETH/SULFA <=20 SENSITIVE Sensitive     AMPICILLIN/SULBACTAM >=32 RESISTANT Resistant     PIP/TAZO <=4 SENSITIVE Sensitive ug/mL    * >=100,000 COLONIES/mL ESCHERICHIA COLI  Resp panel by RT-PCR (RSV, Flu A&B, Covid) Anterior Nasal Swab     Status: None   Collection Time: 01/31/24  9:05 PM   Specimen: Anterior Nasal Swab  Result Value Ref Range Status   SARS Coronavirus 2 by RT PCR NEGATIVE NEGATIVE Final    Comment: (NOTE) SARS-CoV-2 target nucleic acids are NOT DETECTED.  The SARS-CoV-2 RNA is generally detectable in upper respiratory specimens during the acute phase of infection. The lowest concentration of SARS-CoV-2 viral copies this assay can detect is 138 copies/mL. A negative result does not preclude SARS-Cov-2 infection and should not be used as the  sole basis for treatment or other patient management decisions. A negative result may occur with  improper specimen collection/handling,  submission of specimen other than nasopharyngeal swab, presence of viral mutation(s) within the areas targeted by this assay, and inadequate number of viral copies(<138 copies/mL). A negative result must be combined with clinical observations, patient history, and epidemiological information. The expected result is Negative.  Fact Sheet for Patients:  BloggerCourse.com  Fact Sheet for Healthcare Providers:  SeriousBroker.it  This test is no t yet approved or cleared by the [place]  FDA and  has been authorized for detection and/or diagnosis of SARS-CoV-2 by FDA under an Emergency Use Authorization (EUA). This EUA will remain  in effect (meaning this test can be used) for the duration of the COVID-19 declaration under Section 564(b)(1) of the Act, 21 U.S.C.section 360bbb-3(b)(1), unless the authorization is terminated  or revoked sooner.       Influenza A by PCR NEGATIVE NEGATIVE Final   Influenza B by PCR NEGATIVE NEGATIVE Final    Comment: (NOTE) The Xpert Xpress SARS-CoV-2/FLU/RSV plus assay is intended as an aid in the diagnosis of influenza from Nasopharyngeal swab specimens and should not be used as a sole basis for treatment. Nasal washings and aspirates are unacceptable for Xpert Xpress SARS-CoV-2/FLU/RSV testing.  Fact Sheet for Patients: BloggerCourse.com  Fact Sheet for Healthcare Providers: SeriousBroker.it  This test is not yet approved or cleared by the [place]  FDA and has been authorized for detection and/or diagnosis of SARS-CoV-2 by FDA under an Emergency Use Authorization (EUA). This EUA will remain in effect (meaning this test can be used) for the duration of the COVID-19 declaration under Section 564(b)(1) of the Act, 21 U.S.C. section 360bbb-3(b)(1), unless the authorization is terminated or revoked.     Resp Syncytial Virus by PCR NEGATIVE  NEGATIVE Final    Comment: (NOTE) Fact Sheet for Patients: BloggerCourse.com  Fact Sheet for Healthcare Providers: SeriousBroker.it  This test is not yet approved or cleared by the [place]  FDA and has been authorized for detection and/or diagnosis of SARS-CoV-2 by FDA under an Emergency Use Authorization (EUA). This EUA will remain in effect (meaning this test can be used) for the duration of the COVID-19 declaration under Section 564(b)(1) of the Act, 21 U.S.C. section 360bbb-3(b)(1), unless the authorization is terminated or revoked.  Performed at [facility], [address], [place], [place] [address]   Blood Culture (routine x 2)     Status: None (Preliminary result)   Collection Time: 01/31/24  9:15 PM   Specimen: BLOOD  Result Value Ref Range Status   Specimen Description BLOOD LEFT ANTECUBITAL  Final   Special Requests   Final    BOTTLES DRAWN AEROBIC AND ANAEROBIC Blood Culture results may not be optimal due to an inadequate volume of blood received in culture bottles   Culture   Final    NO GROWTH < 12 HOURS Performed at [facility], [address] [place], [place] [address]    Report Status PENDING  Incomplete  Blood Culture (routine x 2)     Status: None (Preliminary result)   Collection Time: 01/31/24  9:30 PM   Specimen: BLOOD  Result Value Ref Range Status   Specimen Description BLOOD BLOOD RIGHT HAND  Final   Special Requests   Final    BOTTLES DRAWN AEROBIC AND ANAEROBIC Blood Culture adequate volume   Culture   Final    NO GROWTH < 12 HOURS Performed at [facility], [address], [place], [place] [address]    Report Status PENDING  Incomplete  Urine Culture     Status: None   Collection Time: 01/31/24  9:34 PM   Specimen: Urine, Random  Result Value Ref Range Status   Specimen Description   Final    URINE, RANDOM Performed at [facility], [address] [place], [place] [address]    Special  Requests   Final    NONE Reflexed from [id number] Performed at [facility], [address], [place], [place] [address]    Culture   Final    NO GROWTH Performed at [facility], [address], [place], [place] [address]    Report Status 02/02/2024 FINAL  Final  MRSA Next Gen by PCR, Nasal     Status: None   Collection Time: 02/01/24  1:23 AM   Specimen: Nasal Mucosa; Nasal Swab  Result Value Ref Range Status   MRSA by PCR Next Gen NOT DETECTED NOT DETECTED Final    Comment: (NOTE) The GeneXpert MRSA Assay (FDA approved for NASAL specimens only), is one component of a comprehensive MRSA colonization surveillance program. It is not intended to diagnose MRSA infection nor to guide or monitor treatment for MRSA infections. Test performance is not FDA approved in patients less than 36 years old. Performed at [facility], [address] [place], [place] [address]       Radiology Studies: CT HEAD WO CONTRAST ( ) Result Date: 02/01/2024 EXAM: CT HEAD WITHOUT CONTRAST 01/31/2024 11:43:48 PM TECHNIQUE: CT of the head was performed without the administration of intravenous contrast. Automated exposure control, iterative reconstruction, and/or weight based adjustment of the mA/kV was utilized to reduce the radiation dose to as low as reasonably achievable. COMPARISON: 12/08/2023 CLINICAL HISTORY: Encephalopathy on anticoag. R/o ICH. Pt bib [facility], per facility pt has not had normal po intake the past few days. Facility did lab work and found his ammonia to be 113. Hx of Hepatic encephalopathy. Per EMS nurse at facility said pt was A\T\Ox4 2 weeks ago when she last took care of him but has very lethargic today. FINDINGS: BRAIN AND VENTRICLES: There is no acute intracranial hemorrhage, mass effect or midline shift. No abnormal extra-axial fluid collection. The gray-white differentiation is maintained without an acute infarct. There is no hydrocephalus. Mild subcortical and periventricular  small vessel ischemic changes. ORBITS: The visualized portion of the orbits demonstrate no acute abnormality. SINUSES: The visualized paranasal sinuses and mastoid air cells demonstrate no acute abnormality. SOFT TISSUES AND SKULL: No acute abnormality of the visualized skull or soft tissues. IMPRESSION: 1. No acute intracranial abnormality. Electronically signed by: Zadie Herter MD 02/01/2024 12:04 AM EDT RP Workstation: [id number]   DG Abd Portable 1 View Result Date: 01/31/2024 CLINICAL DATA:  NG tube EXAM: PORTABLE ABDOMEN - 1 VIEW COMPARISON:  Abdominal x-ray 12/08/2023 FINDINGS: Enteric tube tip is in the proximal body of the stomach. IMPRESSION: Enteric tube tip is in the proximal body of the stomach. Electronically Signed   By: Tyron Gallon M.D.   On: 01/31/2024 22:55   DG Chest Port 1 View Result Date: 01/31/2024 CLINICAL DATA:  Questionable sepsis - evaluate for abnormality EXAM: PORTABLE CHEST 1 VIEW COMPARISON:  12/11/2023 FINDINGS: Chronic elevation of right hemidiaphragm. Stable heart size and mediastinal contours. Mild interstitial coarsening. No focal airspace disease. No pleural effusion or pneumothorax. Right-sided battery pack with lead coursing into the neck. Bilateral shoulder arthroplasties. Remote left rib fracture. IMPRESSION: Mild interstitial coarsening, may represent  bronchial inflammation. No focal airspace disease. Electronically Signed   By: Chadwick Colonel M.D.   On: 01/31/2024 21:26       LOS: 2 days   Kiwana Deblasi Lyndon Santiago  [facility] Pager on [email or web address]  02/02/2024, 9:48 AM

## 2024-02-02 NOTE — Plan of Care (Signed)
  Problem: Acute Rehab OT Goals (only OT should resolve) Goal: Pt. Will Perform Grooming Flowsheets (Taken 02/02/2024 1126) Pt Will Perform Grooming: with set-up Goal: Pt. Will Perform Upper Body Bathing Flowsheets (Taken 02/02/2024 1126) Pt Will Perform Upper Body Bathing: with min assist Goal: Pt. Will Perform Lower Body Dressing Flowsheets (Taken 02/02/2024 1126) Pt Will Perform Lower Body Dressing: with min assist Goal: Pt. Will Transfer To Toilet Flowsheets (Taken 02/02/2024 1126) Pt Will Transfer to Toilet:  with min assist  bedside commode    Azell Boll, OTR/L

## 2024-02-02 NOTE — Progress Notes (Signed)
 Nutrition Follow-up  DOCUMENTATION CODES:   Not applicable  INTERVENTION:   -Continue TF via NGT:   Osmolite 1.5 @ 60 ml/hr   60 ml Prosource TF20 daily  200 ml free water  flush every 4 hours  Tube feeding regimen provides 2240 kcal (100% of needs), 110 grams of protein, and 1097 ml of H2O. Total free water : 2297 ml daily   -Continue monitor Mg, K, and Phos and replete as needed secondary to refeeding risk  -Continue folic acid , MVI and thiamine daily  -Continue Juven Fruit Punch BID via tube, each serving provides 95kcal and 2.5g of protein (amino acids glutamine and arginine) -Continue daily weights    NUTRITION DIAGNOSIS:   Inadequate oral intake related to acute illness as evidenced by NPO status.  Ongoing  GOAL:   Patient will meet greater than or equal to 90% of their needs  Met with TF  MONITOR:   Labs, Weight trends, TF tolerance, I & O's, Skin  REASON FOR ASSESSMENT:   Consult Assessment of nutrition requirement/status  ASSESSMENT:   73 y.o. male with h/o NASH cirrhosis, HFpEF, paroxysmal A-fib on anticoagulation, diabetes, hyperlipidemia, COPD/asthma, OSA, BPH with chronic Foley, HTN, HLD, GERD, pulmonary hypertension, IDA, PUD, seizures, diverticulosis, question of underlying dementia and recent admission 4/11 - 5/15 with hepatic encephalopathy and GI bleeding (found to have portal hypertensive gastropathy and gastric and duodenal AVM s/p APC) who is admitted with FTT, AMS and decompensated cirrhosis.  6/4- NGT placed (KUB revealed tip of tube in stomach)  6/5- s/p BSE- regular diet with thin liquids, TF initiated 6/6- s/p BSE- regular diet with thin liquids  Reviewed I/O's: +2.3 L x 24 hours and +4.5 L since admission  UOP: 1.2 L x 24 hours  Case discussed with RN, MD, and during rounds. MD hopeful that pt's mental status will improve and be able to take some PO/s.   Case discussed with RN, who reports pt is oriented to name only. Pt is taking  medications through NGT. Pt is tolerating TF well (at 60 ml/hr), but noted hyperglycemia. DM coordinator following and insulin  regimen has been adjusted.   Medications reviewed and include folic acid , lactulose , MVI, protonix , xifaxin, and thiamine.   Palliative care following for goals of care discussions.   Labs reviewed: Na: 146, K: 3.2 (on supplementation), Phos: 2.2 (on supplementation), CBGS: [phone] (inpatient orders for glycemic control are 0-20 units insulin  aspart every 4 hours and 20 units insulin  glargine-yfgn daily). Noted changes in insulin  orders; hopeful that this will improve hyperglycemia. Noted DM coordintor recommendations for TF coverage if CBGS remain elevated.   Diet Order:   Diet Order             Diet regular Room service appropriate? Yes; Fluid consistency: Thin  Diet effective now                   EDUCATION NEEDS:   No education needs have been identified at this time  Skin:  Skin Assessment: Reviewed RN Assessment (Stage II sacrum) Skin Integrity Issues:: Other (Comment), Stage II Stage II: sacrum Stage III: - Other: skin tear to sacrum  Last BM:  02/02/24 (type 7)  Height:   Ht Readings from Last 1 Encounters:  02/01/24 5\' 8"  (1.727 m)    Weight:   Wt Readings from Last 1 Encounters:  02/02/24 74.6 kg    Ideal Body Weight:  70 kg  BMI:  Body mass index is 25.01 kg/m.  Estimated Nutritional Needs:  Kcal:  1900-2200kcal/day  Protein:  95-110g/day  Fluid:  1.7-2.0L/day    Herschel Lords, RD, LDN, CDCES Registered Dietitian III Certified Diabetes Care and Education Specialist If unable to reach this RD, please use "RD Inpatient" group chat on secure chat between hours of 8am-4 pm daily

## 2024-02-02 NOTE — Plan of Care (Signed)
   Problem: Education: Goal: Knowledge of General Education information will improve Description: Including pain rating scale, medication(s)/side effects and non-pharmacologic comfort measures Outcome: Not Progressing

## 2024-02-02 NOTE — Evaluation (Signed)
 Occupational Therapy Evaluation Patient Details Name: Tony Cantu MRN: [id number] DOB: 03-26-1951 Today's Date: 02/02/2024   History of Present Illness   Tony Cantu is a 73 y.o. male with hx of decompensated Nash cirrhosis, HFpEF, paroxysmal A-fib on anticoagulation, diabetes, hyperlipidemia, COPD/asthma, OSA, BPH with chronic Foley, question of underlying dementia, failure to thrive, recent admission 4/11 - 5/15 with hepatic encephalopathy and GI bleeding found to have portal hypertensive gastropathy and gastric and duodenal AVM s/p APC.  Was brought in from his SNF at [facility] for increasing lethargy and altered mental status.  History is limited, per triage notes decreased p.o. times few days and increasing lethargy.  Facility did lab work with ammonia level of 113 so sent to the ED.     Clinical Impressions Pt asleep upon arrival. Pt easy to wake but demonstrating cognitive deficits with orientation to place. Per MD notes, pt coming from SNF. When asked if he came from [facility], he was very confused and reports that he did not know what [facility] Cantu was. He required max assist for bed mobility rolling from L to R side. Pt cleaned up from BM and replaced with clean linens and gown. Pt required max assist for bed mobility and unable to balance on EOB. Unable to progress OOB mobility at this time due to increased assist and pt unsafe. Pt would benefit from continued OT services while in acute care setting.      If plan is discharge home, recommend the following:   Two people to help with walking and/or transfers;A lot of help with bathing/dressing/bathroom;Assistance with cooking/housework;Direct supervision/assist for medications management;Direct supervision/assist for financial management;Assist for transportation;Help with stairs or ramp for entrance;Supervision due to cognitive status     Functional Status Assessment   Patient has had a recent decline in their  functional status and demonstrates the ability to make significant improvements in function in a reasonable and predictable amount of time.     Equipment Recommendations   None recommended by OT     Precautions/Restrictions   Precautions Precautions: Fall Recall of Precautions/Restrictions: Impaired Precaution/Restrictions Comments: watch BP; Frequent loose stoolsstage 3 sacral pressure wound; L ischium/buttocks wound Restrictions Weight Bearing Restrictions Per Provider Order: No     Mobility Bed Mobility Overal bed mobility: Needs Assistance Bed Mobility: Rolling, Supine to Sit, Sit to Supine Rolling: Max assist, +2 for physical assistance, Used rails   Supine to sit: Max assist, +2 for physical assistance, Used rails, HOB elevated Sit to supine: HOB elevated, Used rails, Max assist, +2 for physical assistance   General bed mobility comments: Max A x2 for all bed mobility. Pt w/ slow labored movement to initiate transfer. Total A for pericare. Remains lethargic/obtunded during all mobility    Transfers                   General transfer comment: Unsafe to perform on this date due to patient arousal's level and amount of Assist needed for pt bed mobility      Balance Overall balance assessment: Needs assistance Sitting-balance support: Feet supported, Bilateral upper extremity supported   Sitting balance - Comments: Max A req for seated balance EOB       Standing balance comment: Unsafe to perform on this date due to patient arousal's level and amount of Assist needed for pt bed mobility  ADL either performed or assessed with clinical judgement   ADL Overall ADL's : Needs assistance/impaired Eating/Feeding: Moderate assistance;Sitting Eating/Feeding Details (indicate cue type and reason):  (NG tube at this time) Grooming: Moderate assistance   Upper Body Bathing: Moderate assistance;Bed level   Lower Body Bathing:  Maximal assistance;Bed level   Upper Body Dressing : Moderate assistance   Lower Body Dressing: Total assistance;Bed level   Toilet Transfer: Maximal assistance;+2 for physical assistance;+2 for safety/equipment;Squat-pivot   Toileting- Clothing Manipulation and Hygiene: Maximal assistance;+2 for physical assistance;Bed level Toileting - Clothing Manipulation Details (indicate cue type and reason): 'clean up, bed level with rollign side to side Tub/ Shower Transfer: Maximal assistance;Rolling walker (2 wheels);BSC/3in1   Functional mobility during ADLs: Maximal assistance General ADL Comments: unable to progress to OOB mobility due to weakness and cognitive deficits     Vision Baseline Vision/History: 1 Wears glasses Ability to See in Adequate Light: 0 Adequate Patient Visual Report: No change from baseline Vision Assessment?: No apparent visual deficits     Perception Perception: Not tested       Praxis Praxis: Not tested       Pertinent Vitals/Pain Pain Assessment Pain Assessment: Faces Faces Pain Scale: Hurts even more Pain Location: Unsure, but groans with mobility Pain Descriptors / Indicators: Grimacing, Moaning Pain Intervention(s): Limited activity within patient's tolerance     Extremity/Trunk Assessment Upper Extremity Assessment Upper Extremity Assessment: Generalized weakness;Difficult to assess due to impaired cognition   Lower Extremity Assessment Lower Extremity Assessment: Defer to PT evaluation   Cervical / Trunk Assessment Cervical / Trunk Assessment: Kyphotic   Communication Communication Communication: Impaired Factors Affecting Communication: Reduced clarity of speech   Cognition Arousal: Lethargic, Obtunded Behavior During Therapy: Flat affect Cognition: Cognition impaired   Orientation impairments: Place, Time, Situation Awareness: Intellectual awareness impaired Memory impairment (select all impairments): Short-term memory, Working  [employer] fast streamer, Conservation officer, historic buildings Attention impairment (select first level of impairment): Sustained attention Executive functioning impairment (select all impairments): Initiation, Sequencing, Reasoning, Problem solving, Organization OT - Cognition Comments: Pt alert, but fatigued, able to answer questions appropriately, expresses needs, likes to joke around                 Following commands: Impaired Following commands impaired: Follows one step commands with increased time, Follows one step commands inconsistently     Cueing  General Comments   Cueing Techniques: Verbal cues;Tactile cues              Home Living Family/patient expects to be discharged to:: Skilled nursing facility                                 Additional Comments: Pt poor historian. Unable to provide history. Reports he was not home but also not at SNF. No family present during session. Per MD note, pt arriving from [facility]      Prior Functioning/Environment Prior Level of Function : Needs assist  Cognitive Assist : ADLs (cognitive);Mobility (cognitive) Mobility (Cognitive): Step by step cues   Physical Assist : ADLs (physical);Mobility (physical) Mobility (physical): Bed mobility;Transfers;Gait ADLs (physical): Feeding;Grooming;Bathing;Dressing;Toileting;IADLs Mobility Comments: Unable to confirm recent assist level needed at SNF. Per last admit at [facility], pt stated "he was working with rehab at [facility]" ADLs Comments: Per last admission at [facility], "needs assist for ADLs at SNF"    OT Problem List: Decreased strength;Decreased range of motion;Decreased activity tolerance;Impaired balance (sitting and/or standing);Decreased  safety awareness;Decreased cognition;Decreased knowledge of use of DME or AE   OT Treatment/Interventions: Self-care/ADL training;Therapeutic exercise;DME and/or AE instruction;Therapeutic activities;Cognitive remediation/compensation;Patient/family  education;Balance training      OT Goals(Current goals can be found in the care plan section)   Acute Rehab OT Goals Patient Stated Goal: return to [facility] OT Goal Formulation: Patient unable to participate in goal setting Time For Goal Achievement: 02/16/24 Potential to Achieve Goals: Fair ADL Goals Pt Will Perform Grooming: with set-up Pt Will Perform Upper Body Bathing: with min assist Pt Will Perform Lower Body Dressing: with min assist Pt Will Transfer to Toilet: with min assist;bedside commode   OT Frequency:  Min 2X/week    Co-evaluation PT/OT/SLP Co-Evaluation/Treatment: Yes Reason for Co-Treatment: To address functional/ADL transfers PT goals addressed during session: Mobility/safety with mobility OT goals addressed during session: ADL's and self-care      AM-PAC OT "6 Clicks" Daily Activity     Outcome Measure Help from another person eating meals?: A Lot Help from another person taking care of personal grooming?: A Lot Help from another person toileting, which includes using toliet, bedpan, or urinal?: Total Help from another person bathing (including washing, rinsing, drying)?: Total Help from another person to put on and taking off regular upper body clothing?: A Lot Help from another person to put on and taking off regular lower body clothing?: Total 6 Click Score: 9   End of Session Nurse Communication: Other (comment) (Unable to progess OOB, clenaed up pt from BM in bed)  Activity Tolerance: Patient limited by lethargy Patient left: in bed;with call bell/phone within reach;with bed alarm set  OT Visit Diagnosis: Muscle weakness (generalized) (M62.81)                Time: 1610-9604 OT Time Calculation (min): 23 min Charges:  OT General Charges $OT Visit: 1 Visit OT Evaluation $OT Eval Low Complexity: 1 Low    Rawleigh Cadet, OTR/L 02/02/2024, 11:28 AM

## 2024-02-02 NOTE — Progress Notes (Signed)
 Speech Language Pathology Treatment: Dysphagia  Patient Details Name: Tony Cantu MRN: [id number] DOB: 02-Mar-1951 Today's Date: 02/02/2024 Time: 0950-1016 SLP Time Calculation (min) (ACUTE ONLY): 26 min  Assessment / Plan / Recommendation Clinical Impression  Ongoing diagnostic dysphagia therapy provided today; Pt was more alert today, responsive and answering questions. Pt requested "water " - with small sips note no overt s/sx of aspiration, however with consecutive straw sips note immediate coughing and wet vocal quality. Pt pleasantly declined solid trials. Continue recommendations from BSE - when alert recommend resume reg/thin diet. Recommend ice chips and small sips of thin liquids. ST will continue to follow. Thank you,   HPI HPI: Tony Cantu is a 73 y.o. male with hx of decompensated Nash cirrhosis, HFpEF, paroxysmal A-fib on anticoagulation, diabetes, hyperlipidemia, COPD/asthma, OSA, BPH with chronic Foley, question of underlying dementia, failure to thrive, recent admission 4/11 - 5/15 with hepatic encephalopathy and GI bleeding found to have portal hypertensive gastropathy and gastric and duodenal AVM s/p APC.  Was brought in from his SNF at [facility] for increasing lethargy and altered mental status.  History is limited, per triage notes decreased p.o. times few days and increasing lethargy.  Facility did lab work with ammonia level of 113 so sent to the ED.     Discussed with his ex wife and POA Tony Cantu who was notified by SNF of his status and plan for hospitalization. She notes concern about ability to take in PO and limited access to food / water  and him not requesting from staff. Also reports having possible dysphagia / things getting "hung up" when he swallows. Concern about patient refusal of meds and impaired capacity during periods of encephalopathy. Pt was admitted from [place]. He had a prolonged hospitalization last month at [facility] and was seen by SLP and cleared for  regular textures and thin liquids at that time (April). BSE requested.             Recommendations  Diet recommendations: Regular;Thin liquid Medication Administration: Via alternative means Compensations: Slow rate;Small sips/bites Postural Changes and/or Swallow Maneuvers: Seated upright 90 degrees                  Oral care BID     Dysphagia, unspecified (R13.10)          Elanah Osmanovic H. Vergil Glasser, CCC-SLP Speech Language Pathologist  Florina Husbands  02/02/2024, 10:17 AM

## 2024-02-02 NOTE — Evaluation (Signed)
 Physical Therapy Evaluation Patient Details Name: Tony Cantu MRN: [id number] DOB: Mar 07, 1951 Today's Date: 02/02/2024  History of Present Illness  Tony Cantu is a 73 y.o. male with hx of decompensated Nash cirrhosis, HFpEF, paroxysmal A-fib on anticoagulation, diabetes, hyperlipidemia, COPD/asthma, OSA, BPH with chronic Foley, question of underlying dementia, failure to thrive, recent admission 4/11 - 5/15 with hepatic encephalopathy and GI bleeding found to have portal hypertensive gastropathy and gastric and duodenal AVM s/p APC.  Was brought in from his SNF at [facility] for increasing lethargy and altered mental status.  History is limited, per triage notes decreased p.o. times few days and increasing lethargy.  Facility did lab work with ammonia level of 113 so sent to the ED.   Clinical Impression  Patient very limited this date during OT/PT co-evaluation due to arousal levels. Unable to obtain subjective history from patient (oriented to self and place only) and no family present at time of evaluation. History from providers notes reports pt arriving from SNF where he required assist for mobility and ADLs. On this date, patient requires max A x2 for rolling, pericare, and supine<>sit. Patient left In bed, call button within reach. Nursing notified of mobility level. Patient will benefit from continued skilled physical therapy acutely and in recommended venue in order to address his remaining deficits and improve function/QOL.        If plan is discharge home, recommend the following: Two people to help with walking and/or transfers;A lot of help with bathing/dressing/bathroom;Assistance with cooking/housework;Direct supervision/assist for medications management;Direct supervision/assist for financial management;Assist for transportation;Supervision due to cognitive status   Can travel by private vehicle   No    Equipment Recommendations None recommended by PT  Recommendations  for Other Services       Functional Status Assessment Patient has had a recent decline in their functional status and/or demonstrates limited ability to make significant improvements in function in a reasonable and predictable amount of time     Precautions / Restrictions Precautions Precautions: Fall Recall of Precautions/Restrictions: Impaired Restrictions Weight Bearing Restrictions Per Provider Order: No      Mobility  Bed Mobility Overal bed mobility: Needs Assistance Bed Mobility: Rolling, Supine to Sit, Sit to Supine Rolling: Max assist, +2 for physical assistance, Used rails   Supine to sit: Max assist, +2 for physical assistance, Used rails, HOB elevated Sit to supine: HOB elevated, Used rails, Max assist, +2 for physical assistance   General bed mobility comments: Max A x2 for all bed mobility. Pt w/ slow labored movement to initiate transfer. Total A for pericare. Remains lethargic/obtunded during all mobility    Transfers       General transfer comment: Unsafe to perform on this date due to patient arousal's level and amount of Assist needed for pt bed mobility    Ambulation/Gait     General Gait Details: Unsafe to perform on this date due to patient arousal's level and amount of Assist needed for pt bed mobility  Stairs      Wheelchair Mobility     Tilt Bed    Modified Rankin (Stroke Patients Only)       Balance Overall balance assessment: Needs assistance Sitting-balance support: Feet supported, Bilateral upper extremity supported Sitting balance-Leahy Scale: Zero Sitting balance - Comments: Max A req for seated balance EOB       Standing balance comment: Unsafe to perform on this date due to patient arousal's level and amount of Assist needed for pt bed mobility  Pertinent Vitals/Pain Pain Assessment Pain Assessment: Faces Faces Pain Scale: Hurts even more Pain Location: Unsure, but groans with mobility Pain Descriptors /  Indicators: Grimacing, Moaning Pain Intervention(s): Limited activity within patient's tolerance    Home Living Family/patient expects to be discharged to:: Skilled nursing facility       Additional Comments: Pt poor historian. Unable to provide history. Reports he was not home but also not at SNF. No family present during session. Per MD note, pt arriving from [facility]    Prior Function Prior Level of Function : Needs assist       Physical Assist : ADLs (physical);Mobility (physical) Mobility (physical): Bed mobility;Transfers;Gait ADLs (physical): Bathing;Dressing;Toileting;IADLs Mobility Comments: Unable to confirm recent assist level needed at SNF. Per last admit at [facility], pt stated "he was working with rehab at [facility]" ADLs Comments: Per last admission at [facility], "needs assist for ADLs at [facility] [address]"     Extremity/Trunk Assessment   Upper Extremity Assessment Upper Extremity Assessment: Defer to OT evaluation    Lower Extremity Assessment Lower Extremity Assessment: Generalized weakness;Difficult to assess due to impaired cognition (Pt generally weak t/o. Inc time and max assist needed for bed mobility. 2+/3- at best)    Cervical / Trunk Assessment Cervical / Trunk Assessment: Kyphotic  Communication   Communication Communication: Impaired Factors Affecting Communication: Reduced clarity of speech    Cognition Arousal: Lethargic, Obtunded Behavior During Therapy: Flat affect   PT - Cognitive impairments: No family/caregiver present to determine baseline, Difficult to assess, Orientation, Initiation Difficult to assess due to: Level of arousal Orientation impairments: Time, Situation         PT - Cognition Comments: Pt oriented to self and place only Following commands: Impaired Following commands impaired: Follows one step commands with increased time, Follows one step commands inconsistently     Cueing Cueing Techniques: Verbal cues, Tactile cues      General Comments      Exercises     Assessment/Plan    PT Assessment Patient needs continued PT services;All further PT needs can be met in the next venue of care  PT Problem List Decreased strength;Decreased range of motion;Decreased activity tolerance;Decreased balance;Decreased mobility;Decreased cognition;Decreased knowledge of use of DME;Decreased safety awareness;Decreased knowledge of precautions;Cardiopulmonary status limiting activity       PT Treatment Interventions DME instruction;Gait training;Functional mobility training;Therapeutic activities;Therapeutic exercise;Balance training;Neuromuscular re-education;Patient/family education;Cognitive remediation;Wheelchair mobility training    PT Goals (Current goals can be found in the Care Plan section)  Acute Rehab PT Goals Patient Stated Goal: Return to rehab PT Goal Formulation: Patient unable to participate in goal setting Time For Goal Achievement: 02/09/24 Potential to Achieve Goals: Good    Frequency Min 3X/week     Co-evaluation PT/OT/SLP Co-Evaluation/Treatment: Yes Reason for Co-Treatment: To address functional/ADL transfers PT goals addressed during session: Mobility/safety with mobility         AM-PAC PT "6 Clicks" Mobility  Outcome Measure Help needed turning from your back to your side while in a flat bed without using bedrails?: A Lot Help needed moving from lying on your back to sitting on the side of a flat bed without using bedrails?: A Lot Help needed moving to and from a bed to a chair (including a wheelchair)?: A Lot Help needed standing up from a chair using your arms (e.g., wheelchair or bedside chair)?: A Lot Help needed to walk in hospital room?: Total Help needed climbing 3-5 steps with a railing? : Total 6 Click Score: 10  End of Session   Activity Tolerance: Patient limited by fatigue;Patient limited by lethargy Patient left: in bed;with call bell/phone within reach Nurse  Communication: Mobility status PT Visit Diagnosis: Muscle weakness (generalized) (M62.81);Difficulty in walking, not elsewhere classified (R26.2);Other symptoms and signs involving the nervous system (R29.898);Dizziness and giddiness (R42);Unsteadiness on feet (R26.81)    Time: 4098-1191 PT Time Calculation (min) (ACUTE ONLY): 25 min   Charges:   PT Evaluation $PT Eval Moderate Complexity: 1 Mod PT Treatments $Therapeutic Activity: 23-37 mins PT General Charges $$ ACUTE PT VISIT: 1 Visit         11:10 AM, 02/02/24 Marysue Sola, PT, DPT [place] with [facility]

## 2024-02-02 NOTE — Plan of Care (Signed)
  Problem: Acute Rehab PT Goals(only PT should resolve) Goal: Pt will Roll Supine to Side Outcome: Progressing Flowsheets (Taken 02/02/2024 1112) Pt will Roll Supine to Side: with mod assist Goal: Pt Will Go Supine/Side To Sit Outcome: Progressing Flowsheets (Taken 02/02/2024 1112) Pt will go Supine/Side to Sit: with moderate assist Goal: Patient Will Perform Sitting Balance Outcome: Progressing Flowsheets (Taken 02/02/2024 1112) Patient will perform sitting balance: with minimal assist Goal: Patient Will Transfer Sit To/From Stand Outcome: Progressing Flowsheets (Taken 02/02/2024 1112) Patient will transfer sit to/from stand: with moderate assist Goal: Pt Will Transfer Bed To Chair/Chair To Bed Outcome: Progressing Flowsheets (Taken 02/02/2024 1112) Pt will Transfer Bed to Chair/Chair to Bed: with mod assist    11:13 AM, 02/02/24 Marysue Sola, PT, DPT [place] with [facility]

## 2024-02-02 NOTE — Inpatient Diabetes Management (Addendum)
 Inpatient Diabetes Program Recommendations  AACE/ADA: New Consensus Statement on Inpatient Glycemic Control (2015)  Target Ranges:  Prepandial:   less than 140 mg/dL      Peak postprandial:   less than 180 mg/dL (1-2 hours)      Critically ill patients:  140 - 180 mg/dL   Lab Results  Component Value Date   GLUCAP 367 (H) 02/02/2024   HGBA1C 4.4 (L) 09/19/2023    Latest Reference Range & Units 02/02/24 00:35 02/02/24 03:37 02/02/24 06:51 02/02/24 07:29 02/02/24 11:22  Glucose-Capillary 70 - 99 mg/dL 161 (H) 096 (H) 045 (H) 487 (H) 367 (H)  (H): Data is abnormally high Review of Glycemic Control  Diabetes history: DM2 Outpatient Diabetes medications: Semglee  25 units daily, Semglee  8 units at HS, Novolog  correction scale 0-9 units TID with meals Current orders for Inpatient glycemic control: Semglee  20 units daily, Novolog  0-20 units correction scale every 4 hours  Inpatient Diabetes Program Recommendations:   Noted that Semglee  has been increased today to 20 units. If blood sugars continue to be greater than 180 mg/dl, recommend adding Novolog  3 units every 4 hours for tube feed coverage. Titrate dosages as needed.  Nick Barman RN BSN CDE Diabetes Coordinator Pager: [phone]  8am-5pm

## 2024-02-03 DIAGNOSIS — E870 Hyperosmolality and hypernatremia: Secondary | ICD-10-CM | POA: Diagnosis not present

## 2024-02-03 DIAGNOSIS — E876 Hypokalemia: Secondary | ICD-10-CM | POA: Diagnosis not present

## 2024-02-03 DIAGNOSIS — D696 Thrombocytopenia, unspecified: Secondary | ICD-10-CM | POA: Diagnosis not present

## 2024-02-03 DIAGNOSIS — K7682 Hepatic encephalopathy: Secondary | ICD-10-CM | POA: Diagnosis not present

## 2024-02-03 LAB — GLUCOSE, CAPILLARY
Glucose-Capillary: 188 mg/dL — ABNORMAL HIGH (ref 70–99)
Glucose-Capillary: 199 mg/dL — ABNORMAL HIGH (ref 70–99)
Glucose-Capillary: 200 mg/dL — ABNORMAL HIGH (ref 70–99)
Glucose-Capillary: 282 mg/dL — ABNORMAL HIGH (ref 70–99)
Glucose-Capillary: 392 mg/dL — ABNORMAL HIGH (ref 70–99)
Glucose-Capillary: 414 mg/dL — ABNORMAL HIGH (ref 70–99)
Glucose-Capillary: 460 mg/dL — ABNORMAL HIGH (ref 70–99)

## 2024-02-03 LAB — BASIC METABOLIC PANEL WITH GFR
Anion gap: 5 (ref 5–15)
BUN: 18 mg/dL (ref 8–23)
CO2: 26 mmol/L (ref 22–32)
Calcium: 9.9 mg/dL (ref 8.9–10.3)
Chloride: 121 mmol/L — ABNORMAL HIGH (ref 98–111)
Creatinine, Ser: 0.64 mg/dL (ref 0.61–1.24)
GFR, Estimated: >60 mL/min (ref >60)
Glucose, Bld: 285 mg/dL — ABNORMAL HIGH (ref 70–99)
Potassium: 3.2 mmol/L — ABNORMAL LOW (ref 3.5–5.1)
Sodium: 152 mmol/L — ABNORMAL HIGH (ref 135–145)

## 2024-02-03 LAB — PHOSPHORUS: Phosphorus: 1.4 mg/dL — ABNORMAL LOW (ref 2.5–4.6)

## 2024-02-03 LAB — MAGNESIUM: Magnesium: 2 mg/dL (ref 1.7–2.4)

## 2024-02-03 MED ORDER — POTASSIUM PHOSPHATES 15 MMOLE/5ML IV SOLN
30.0000 mmol | Freq: Once | INTRAVENOUS | Status: AC
  Administered 2024-02-03: 30 mmol via INTRAVENOUS
  Filled 2024-02-03: qty 10

## 2024-02-03 MED ORDER — FREE WATER
300.0000 mL | Status: DC
  Administered 2024-02-03 – 2024-02-04 (×7): 300 mL

## 2024-02-03 MED ORDER — SODIUM CHLORIDE 0.9 % IV BOLUS
500.0000 mL | Freq: Once | INTRAVENOUS | Status: AC
  Administered 2024-02-03: 500 mL via INTRAVENOUS

## 2024-02-03 MED ORDER — INSULIN GLARGINE-YFGN 100 UNIT/ML ~~LOC~~ SOLN
20.0000 [IU] | Freq: Two times a day (BID) | SUBCUTANEOUS | Status: DC
  Administered 2024-02-03 – 2024-02-06 (×6): 20 [IU] via SUBCUTANEOUS
  Filled 2024-02-03 (×8): qty 0.2

## 2024-02-03 MED ORDER — LACTULOSE 10 GM/15ML PO SOLN
30.0000 g | Freq: Three times a day (TID) | ORAL | Status: DC
  Administered 2024-02-03 – 2024-02-04 (×3): 30 g
  Filled 2024-02-03 (×3): qty 60

## 2024-02-03 MED ORDER — PANTOPRAZOLE SODIUM 40 MG PO TBEC
40.0000 mg | DELAYED_RELEASE_TABLET | Freq: Two times a day (BID) | ORAL | Status: DC
  Administered 2024-02-03 – 2024-02-06 (×7): 40 mg via ORAL
  Filled 2024-02-03 (×8): qty 1

## 2024-02-03 NOTE — Plan of Care (Signed)
   Problem: Education: Goal: Knowledge of General Education information will improve Description: Including pain rating scale, medication(s)/side effects and non-pharmacologic comfort measures Outcome: Not Progressing

## 2024-02-03 NOTE — Progress Notes (Signed)
 [facility] PROGRESS NOTE   Tony Cantu ZOX:[id number] DOB: 09/02/1950 DOA: 01/31/2024  PCP: [name], DO  Brief History: As per H&P written by Dr. Amy Kansky on 01/31/2024  Tony Cantu is a 73 y.o. male with hx of decompensated Nash cirrhosis, HFpEF, paroxysmal A-fib on anticoagulation, diabetes, hyperlipidemia, COPD/asthma, OSA, BPH with chronic Foley, question of underlying dementia, failure to thrive, recent admission 4/11 - 5/15 with hepatic encephalopathy and GI bleeding found to have portal hypertensive gastropathy and gastric and duodenal AVM s/p APC.  Was brought in from his SNF at [facility] for increasing lethargy and altered mental status.  History is limited, per triage notes decreased p.o. times few days and increasing lethargy.  Facility did lab work with ammonia level of 113 so sent to the ED.  Consultants: Palliative care  Procedures: None    Subjective/Interval History: Patient lethargic but arousable.  No overnight events per nursing staff.  Still distracted and confused at times.  Complains of abdominal discomfort.   Assessment/Plan:  Acute metabolic encephalopathy: Multifactorial - In the setting of hepatic encephalopathy with elevated ammonia level; hypoglycemia and hypernatremia. - No acute source of infection appreciated - Report from the skilled nursing facility expressing decreased oral intake and refusal of several his medications for the last 48 to 72 hours. Patient became significantly more lethargic and was not able to follow commands. - NG tube placed to facilitate resumption of medications  Patient noted to be on rifaximin  and lactulose .  Having multiple bowel movements. Ammonia level has been trending down.  Mentation has improved but will probably never return to normal because of his underlying dementia. He had 7 bowel movements yesterday.  Experiencing some abdominal discomfort.  Will cut back on the dose of his lactulose .  Poor  oral intake Secondary to encephalopathy.  NG tube was placed and started on tube feedings.  Seen by speech therapy and cleared for oral intake.   Oral intake has been poor per nursing staff.  Continue to encourage.  Continue tube feedings for now.    Decompensated Nash cirrhosis - Continue treatment as mentioned above with lactulose , rifaximin  and will continue PPI. - Patient on midodrine  3 times a day - Palliative care has been consulted.  Hypernatremia Continue with free water .  Improvements in sodium levels noted.  Increase free water .  Hypokalemia/hypophosphatemia Continue to supplement.    Type 2 diabetes mellitus with hypoglycemia Initially noted to have hypoglycemia.  This was secondary to poor oral intake. Then he was noted to be hyperglycemic.  Likely due to tube feedings.  A1c was 4.4 in January. Home medication list reviewed.  Supposedly on glargine insulin  prior to admission.   Continue current dose of glargine and SSI.  Improvement in CBGs noted.    Chronic diastolic heart failure - Stable and compensated - Last echo with ejection fraction 60 to 65%  Paroxysmal atrial fibrillation - Currently rate controlled - Continue amiodarone  and the use of Eliquis .  Chronic BPH - Patient with chronic Foley in place; this has been exchanged in the ED at time of admission - There was no signs of UTI.  COPD/asthma - No wheezing on exam - Continue bronchodilator management.  Stage II pressure injury - Appreciated in his sacrum - Present at time of admission - No signs of superimposed infection.  Chronic thrombocytopenia - No overt bleeding appreciated - Associated with cirrhosis history.  Goals of care Palliative care consulted.  Mentation has improved but due to underlying dementia  he will never have "normal" mentation. The main thing will be to see if we are able to get him off of his tube feedings.  He is not a good candidate for PEG tube. May need to try taking the  tube out to see if his oral intake improves.    DVT Prophylaxis: On apixaban  Code Status: Full code Family Communication: No family at bedside Disposition Plan: SNF when medically stable     Medications: Scheduled:  amiodarone   100 mg Per Tube Daily   apixaban   5 mg Per Tube BID   Chlorhexidine  Gluconate Cloth  6 each Topical Q0600   feeding supplement  237 mL Oral TID BM   feeding supplement (PROSource TF20)  60 mL Per Tube Daily   folic acid   1 mg Oral Daily   free water   300 mL Per Tube Q4H   insulin  aspart  0-20 Units Subcutaneous Q4H   insulin  glargine-yfgn  15 Units Subcutaneous BID   lactulose   30 g Per Tube QID   melatonin  6 mg Per Tube QHS   midodrine   10 mg Per Tube TID with meals   montelukast   10 mg Per Tube QHS   multivitamin with minerals  1 tablet Per Tube Daily   nutrition supplement (JUVEN)  1 packet Per Tube BID BM   mouth rinse  15 mL Mouth Rinse 4 times per day   pantoprazole  (PROTONIX ) IV  40 mg Intravenous Q12H   rifaximin   550 mg Per Tube BID   sodium chloride  flush  3 mL Intravenous Q12H   thiamine   100 mg Per Tube Daily   Continuous:  feeding supplement (OSMOLITE 1.5 CAL) 60 mL/hr at 02/03/24 0640   potassium PHOSPHATE  IVPB (in mmol)     PRN:acetaminophen , albuterol , mouth rinse  Antibiotics: Anti-infectives (From admission, onward)    Start     Dose/Rate Route Frequency Ordered Stop   01/31/24 2315  rifaximin  (XIFAXAN ) tablet 550 mg        550 mg Per Tube 2 times daily 01/31/24 2304     01/31/24 2115  ceFEPIme  (MAXIPIME ) 2 g in sodium chloride  0.9 % 100 mL IVPB        2 g 200 mL/hr over 30 Minutes Intravenous  Once 01/31/24 2105 01/31/24 2200   01/31/24 2115  metroNIDAZOLE  (FLAGYL ) IVPB 500 mg        500 mg 100 mL/hr over 60 Minutes Intravenous  Once 01/31/24 2105 01/31/24 2302   01/31/24 2115  vancomycin  (VANCOCIN ) IVPB 1000 mg/200 mL premix        1,000 mg 200 mL/hr over 60 Minutes Intravenous  Once 01/31/24 2105 02/01/24 0022        Objective:  Vital Signs  Vitals:   02/03/24 0619 02/03/24 0700 02/03/24 0730 02/03/24 0820  BP: (!) 113/50 (!) 112/52 (!) 87/52 (!) 84/57  Pulse: 64 66 65 64  Resp: 19 (!) 22 20 20   Temp:   98.2 F (36.8 C)   TempSrc:   Axillary   SpO2:   96% 100%  Weight:      Height:        Intake/Output Summary (Last 24 hours) at 02/03/2024 0827 Last data filed at 02/03/2024 0817 Gross per 24 hour  Intake 151.17 ml  Output 1800 ml  Net -1648.83 ml   Filed Weights   02/01/24 0100 02/02/24 0413 02/03/24 0500  Weight: 69.1 kg 74.6 kg 70.1 kg    General appearance: Awake alert.  Distracted.  No distress.  Resp: Clear to auscultation bilaterally.  Normal effort Cardio: S1-S2 is normal regular.  No S3-S4.  No rubs murmurs or bruit GI: Abdomen is soft.  Mildly tender in the left lower quadrant without any rebound rigidity or guarding.  No masses organomegaly. Disoriented.  No obvious focal neurological deficits.  Lab Results:  Data Reviewed: I have personally reviewed following labs and reports of the imaging studies  CBC: Recent Labs  Lab 01/31/24 2125 02/01/24 0418 02/02/24 0439  WBC 7.5 6.4 5.9  NEUTROABS 4.2  --   --   HGB 12.7* 11.3* 11.0*  HCT 37.5* 35.5* 34.0*  MCV 106.5* 109.9* 112.2*  PLT 111* 86* 82*    Basic Metabolic Panel: Recent Labs  Lab 01/31/24 2125 02/01/24 0418 02/02/24 0439 02/03/24 0523 02/03/24 0718  NA 152* 148* 146*  --  152*  K 2.2* 3.3* 3.2*  --  3.2*  CL 113* 112* 111  --  121*  CO2 28 26 24   --  26  GLUCOSE 62* 364* 539*  --  285*  BUN 17 14 11   --  18  CREATININE 0.93 0.86 0.76  --  0.64  CALCIUM  10.8* 10.1 9.2  --  9.9  MG 2.5* 2.1 2.0 2.0  --   PHOS  --  2.0* 2.2* 1.4*  --     GFR: Estimated Creatinine Clearance: 78.4 mL/min (by C-G formula based on SCr of 0.64 mg/dL).  Liver Function Tests: Recent Labs  Lab 01/31/24 2125 02/01/24 0418 02/02/24 0439  AST 53* 59* 73*  ALT 35 34 39  ALKPHOS 113 105 120  BILITOT 1.2 1.3* 0.6   PROT 6.4* 5.7* 5.3*  ALBUMIN  2.7* 2.4* 2.2*     Recent Labs  Lab 01/31/24 2125 02/01/24 0418 02/02/24 0439  AMMONIA 100* 68* 56*    Coagulation Profile: Recent Labs  Lab 01/31/24 2125  INR 1.5*    CBG: Recent Labs  Lab 02/02/24 1619 02/02/24 2002 02/03/24 0001 02/03/24 0430 02/03/24 0727  GLUCAP 425* 387* 199* 282* 200*    Thyroid  Function Tests: Recent Labs    02/01/24 0418  TSH 1.077    Anemia Panel: Recent Labs    02/01/24 0418  VITAMINB12 1,685*  FOLATE 7.1    Recent Results (from the past 240 hours)  Urine Culture     Status: Abnormal   Collection Time: 01/26/24 12:00 PM   Specimen: Urine, Random  Result Value Ref Range Status   Specimen Description   Final    URINE, RANDOM Performed at [facility], [address] [place], [place] [address]    Special Requests   Final    NONE Reflexed from [id number] Performed at [facility], [address], [place], [place] [address]    Culture >=100,000 COLONIES/mL ESCHERICHIA COLI (A)  Final   Report Status 01/28/2024 FINAL  Final   Organism ID, Bacteria ESCHERICHIA COLI (A)  Final      Susceptibility   Escherichia coli - MIC*    AMPICILLIN >=32 RESISTANT Resistant     CEFAZOLIN  8 SENSITIVE Sensitive     CEFEPIME  <=0.12 SENSITIVE Sensitive     CEFTRIAXONE  <=0.25 SENSITIVE Sensitive     CIPROFLOXACIN  >=4 RESISTANT Resistant     GENTAMICIN <=1 SENSITIVE Sensitive     IMIPENEM <=0.25 SENSITIVE Sensitive     NITROFURANTOIN <=16 SENSITIVE Sensitive     TRIMETH/SULFA <=20 SENSITIVE Sensitive     AMPICILLIN/SULBACTAM >=32 RESISTANT Resistant     PIP/TAZO <=4 SENSITIVE Sensitive ug/mL    * >=  100,000 COLONIES/mL ESCHERICHIA COLI  Resp panel by RT-PCR (RSV, Flu A&B, Covid) Anterior Nasal Swab     Status: None   Collection Time: 01/31/24  9:05 PM   Specimen: Anterior Nasal Swab  Result Value Ref Range Status   SARS Coronavirus 2 by RT PCR NEGATIVE NEGATIVE Final    Comment: (NOTE) SARS-CoV-2 target nucleic  acids are NOT DETECTED.  The SARS-CoV-2 RNA is generally detectable in upper respiratory specimens during the acute phase of infection. The lowest concentration of SARS-CoV-2 viral copies this assay can detect is 138 copies/mL. A negative result does not preclude SARS-Cov-2 infection and should not be used as the sole basis for treatment or other patient management decisions. A negative result may occur with  improper specimen collection/handling, submission of specimen other than nasopharyngeal swab, presence of viral mutation(s) within the areas targeted by this assay, and inadequate number of viral copies(<138 copies/mL). A negative result must be combined with clinical observations, patient history, and epidemiological information. The expected result is Negative.  Fact Sheet for Patients:  BloggerCourse.com  Fact Sheet for Healthcare Providers:  SeriousBroker.it  This test is no t yet approved or cleared by the [place]  FDA and  has been authorized for detection and/or diagnosis of SARS-CoV-2 by FDA under an Emergency Use Authorization (EUA). This EUA will remain  in effect (meaning this test can be used) for the duration of the COVID-19 declaration under Section 564(b)(1) of the Act, 21 U.S.C.section 360bbb-3(b)(1), unless the authorization is terminated  or revoked sooner.       Influenza A by PCR NEGATIVE NEGATIVE Final   Influenza B by PCR NEGATIVE NEGATIVE Final    Comment: (NOTE) The Xpert Xpress SARS-CoV-2/FLU/RSV plus assay is intended as an aid in the diagnosis of influenza from Nasopharyngeal swab specimens and should not be used as a sole basis for treatment. Nasal washings and aspirates are unacceptable for Xpert Xpress SARS-CoV-2/FLU/RSV testing.  Fact Sheet for Patients: BloggerCourse.com  Fact Sheet for Healthcare Providers: SeriousBroker.it  This  test is not yet approved or cleared by the [place]  FDA and has been authorized for detection and/or diagnosis of SARS-CoV-2 by FDA under an Emergency Use Authorization (EUA). This EUA will remain in effect (meaning this test can be used) for the duration of the COVID-19 declaration under Section 564(b)(1) of the Act, 21 U.S.C. section 360bbb-3(b)(1), unless the authorization is terminated or revoked.     Resp Syncytial Virus by PCR NEGATIVE NEGATIVE Final    Comment: (NOTE) Fact Sheet for Patients: BloggerCourse.com  Fact Sheet for Healthcare Providers: SeriousBroker.it  This test is not yet approved or cleared by the [place]  FDA and has been authorized for detection and/or diagnosis of SARS-CoV-2 by FDA under an Emergency Use Authorization (EUA). This EUA will remain in effect (meaning this test can be used) for the duration of the COVID-19 declaration under Section 564(b)(1) of the Act, 21 U.S.C. section 360bbb-3(b)(1), unless the authorization is terminated or revoked.  Performed at [facility], [address], [place], [place] [address]   Blood Culture (routine x 2)     Status: None (Preliminary result)   Collection Time: 01/31/24  9:15 PM   Specimen: BLOOD  Result Value Ref Range Status   Specimen Description BLOOD LEFT ANTECUBITAL  Final   Special Requests   Final    BOTTLES DRAWN AEROBIC AND ANAEROBIC Blood Culture results may not be optimal due to an inadequate volume of blood received in culture bottles   Culture  Final    NO GROWTH 3 DAYS Performed at [facility], [address], [place], [place] [address]    Report Status PENDING  Incomplete  Blood Culture (routine x 2)     Status: None (Preliminary result)   Collection Time: 01/31/24  9:30 PM   Specimen: BLOOD  Result Value Ref Range Status   Specimen Description BLOOD BLOOD RIGHT HAND  Final   Special Requests   Final    BOTTLES DRAWN AEROBIC AND  ANAEROBIC Blood Culture adequate volume   Culture   Final    NO GROWTH 3 DAYS Performed at [facility], [address], [place], [place] [address]    Report Status PENDING  Incomplete  Urine Culture     Status: None   Collection Time: 01/31/24  9:34 PM   Specimen: Urine, Random  Result Value Ref Range Status   Specimen Description   Final    URINE, RANDOM Performed at [facility], [address], [place], [place] [address]    Special Requests   Final    NONE Reflexed from [id number] Performed at [facility], [address], [place], [place] [address]    Culture   Final    NO GROWTH Performed at [facility], [address] [place], [place] [address]    Report Status 02/02/2024 FINAL  Final  MRSA Next Gen by PCR, Nasal     Status: None   Collection Time: 02/01/24  1:23 AM   Specimen: Nasal Mucosa; Nasal Swab  Result Value Ref Range Status   MRSA by PCR Next Gen NOT DETECTED NOT DETECTED Final    Comment: (NOTE) The GeneXpert MRSA Assay (FDA approved for NASAL specimens only), is one component of a comprehensive MRSA colonization surveillance program. It is not intended to diagnose MRSA infection nor to guide or monitor treatment for MRSA infections. Test performance is not FDA approved in patients less than 15 years old. Performed at [facility], [address] [place], [place] [address]       Radiology Studies: No results found.      LOS: 3 days   Blaise Grieshaber [employer] on [email or web address]  02/03/2024, 8:27 AM

## 2024-02-03 NOTE — Plan of Care (Signed)
  Problem: Education: Goal: Knowledge of General Education information will improve Description: Including pain rating scale, medication(s)/side effects and non-pharmacologic comfort measures Outcome: Not Progressing   Problem: Health Behavior/Discharge Planning: Goal: Ability to manage health-related needs will improve Outcome: Progressing   Problem: Clinical Measurements: Goal: Ability to maintain clinical measurements within normal limits will improve Outcome: Progressing Goal: Will remain free from infection Outcome: Progressing Goal: Diagnostic test results will improve Outcome: Progressing Goal: Respiratory complications will improve Outcome: Progressing Goal: Cardiovascular complication will be avoided Outcome: Progressing   Problem: Activity: Goal: Risk for activity intolerance will decrease Outcome: Not Progressing   Problem: Nutrition: Goal: Adequate nutrition will be maintained Outcome: Progressing   Problem: Coping: Goal: Level of anxiety will decrease Outcome: Progressing   Problem: Elimination: Goal: Will not experience complications related to bowel motility Outcome: Not Progressing Goal: Will not experience complications related to urinary retention Outcome: Progressing   Problem: Pain Managment: Goal: General experience of comfort will improve and/or be controlled Outcome: Progressing   Problem: Safety: Goal: Ability to remain free from injury will improve Outcome: Progressing   Problem: Skin Integrity: Goal: Risk for impaired skin integrity will decrease Outcome: Not Progressing   Problem: Education: Goal: Ability to describe self-care measures that may prevent or decrease complications (Diabetes Survival Skills Education) will improve Outcome: Not Progressing Goal: Individualized Educational Video(s) Outcome: Not Progressing   Problem: Coping: Goal: Ability to adjust to condition or change in health will improve Outcome: Progressing    Problem: Fluid Volume: Goal: Ability to maintain a balanced intake and output will improve Outcome: Progressing   Problem: Health Behavior/Discharge Planning: Goal: Ability to identify and utilize available resources and services will improve Outcome: Progressing Goal: Ability to manage health-related needs will improve Outcome: Progressing   Problem: Nutritional: Goal: Maintenance of adequate nutrition will improve Outcome: Progressing Goal: Progress toward achieving an optimal weight will improve Outcome: Progressing   Problem: Skin Integrity: Goal: Risk for impaired skin integrity will decrease Outcome: Progressing   Problem: Tissue Perfusion: Goal: Adequacy of tissue perfusion will improve Outcome: Progressing

## 2024-02-04 DIAGNOSIS — E870 Hyperosmolality and hypernatremia: Secondary | ICD-10-CM | POA: Diagnosis not present

## 2024-02-04 DIAGNOSIS — D696 Thrombocytopenia, unspecified: Secondary | ICD-10-CM | POA: Diagnosis not present

## 2024-02-04 DIAGNOSIS — K7682 Hepatic encephalopathy: Secondary | ICD-10-CM | POA: Diagnosis not present

## 2024-02-04 LAB — CBC
HCT: 32.8 % — ABNORMAL LOW (ref 39.0–52.0)
Hemoglobin: 10.2 g/dL — ABNORMAL LOW (ref 13.0–17.0)
MCH: 35.2 pg — ABNORMAL HIGH (ref 26.0–34.0)
MCHC: 31.1 g/dL (ref 30.0–36.0)
MCV: 113.1 fL — ABNORMAL HIGH (ref 80.0–100.0)
Platelets: 64 10*3/uL — ABNORMAL LOW (ref 150–400)
RBC: 2.9 MIL/uL — ABNORMAL LOW (ref 4.22–5.81)
RDW: 18.2 % — ABNORMAL HIGH (ref 11.5–15.5)
WBC: 6.8 10*3/uL (ref 4.0–10.5)
nRBC: 0 % (ref 0.0–0.2)

## 2024-02-04 LAB — MAGNESIUM: Magnesium: 2 mg/dL (ref 1.7–2.4)

## 2024-02-04 LAB — BASIC METABOLIC PANEL WITH GFR
Anion gap: 2 — ABNORMAL LOW (ref 5–15)
BUN: 21 mg/dL (ref 8–23)
CO2: 29 mmol/L (ref 22–32)
Calcium: 9.4 mg/dL (ref 8.9–10.3)
Chloride: 119 mmol/L — ABNORMAL HIGH (ref 98–111)
Creatinine, Ser: 0.62 mg/dL (ref 0.61–1.24)
GFR, Estimated: >60 mL/min (ref >60)
Glucose, Bld: 247 mg/dL — ABNORMAL HIGH (ref 70–99)
Potassium: 3.7 mmol/L (ref 3.5–5.1)
Sodium: 150 mmol/L — ABNORMAL HIGH (ref 135–145)

## 2024-02-04 LAB — GLUCOSE, CAPILLARY
Glucose-Capillary: 231 mg/dL — ABNORMAL HIGH (ref 70–99)
Glucose-Capillary: 262 mg/dL — ABNORMAL HIGH (ref 70–99)
Glucose-Capillary: 321 mg/dL — ABNORMAL HIGH (ref 70–99)
Glucose-Capillary: 232 mg/dL — ABNORMAL HIGH (ref 70–99)
Glucose-Capillary: 138 mg/dL — ABNORMAL HIGH (ref 70–99)

## 2024-02-04 LAB — PHOSPHORUS: Phosphorus: 2.6 mg/dL (ref 2.5–4.6)

## 2024-02-04 MED ORDER — AMIODARONE HCL 200 MG PO TABS
100.0000 mg | ORAL_TABLET | Freq: Every day | ORAL | Status: DC
  Administered 2024-02-05 – 2024-02-06 (×2): 100 mg via ORAL
  Filled 2024-02-04 (×4): qty 1

## 2024-02-04 MED ORDER — ADULT MULTIVITAMIN W/MINERALS CH
1.0000 | ORAL_TABLET | Freq: Every day | ORAL | Status: DC
  Administered 2024-02-05 – 2024-02-06 (×2): 1 via ORAL
  Filled 2024-02-04 (×3): qty 1

## 2024-02-04 MED ORDER — RIFAXIMIN 550 MG PO TABS
550.0000 mg | ORAL_TABLET | Freq: Two times a day (BID) | ORAL | Status: DC
  Administered 2024-02-04 – 2024-02-06 (×5): 550 mg via ORAL
  Filled 2024-02-04 (×6): qty 1

## 2024-02-04 MED ORDER — MONTELUKAST SODIUM 10 MG PO TABS
10.0000 mg | ORAL_TABLET | Freq: Every day | ORAL | Status: DC
  Administered 2024-02-04 – 2024-02-06 (×3): 10 mg via ORAL
  Filled 2024-02-04 (×3): qty 1

## 2024-02-04 MED ORDER — THIAMINE MONONITRATE 100 MG PO TABS
100.0000 mg | ORAL_TABLET | Freq: Every day | ORAL | Status: DC
  Administered 2024-02-05 – 2024-02-06 (×2): 100 mg via ORAL
  Filled 2024-02-04 (×3): qty 1

## 2024-02-04 MED ORDER — APIXABAN 5 MG PO TABS
5.0000 mg | ORAL_TABLET | Freq: Two times a day (BID) | ORAL | Status: DC
  Administered 2024-02-04 – 2024-02-06 (×5): 5 mg via ORAL
  Filled 2024-02-04 (×6): qty 1

## 2024-02-04 MED ORDER — MIDODRINE HCL 5 MG PO TABS
10.0000 mg | ORAL_TABLET | Freq: Three times a day (TID) | ORAL | Status: DC
  Administered 2024-02-04 – 2024-02-06 (×8): 10 mg via ORAL
  Filled 2024-02-04 (×9): qty 2

## 2024-02-04 MED ORDER — LACTULOSE 10 GM/15ML PO SOLN
30.0000 g | Freq: Three times a day (TID) | ORAL | Status: DC
  Administered 2024-02-04 – 2024-02-06 (×8): 30 g via ORAL
  Filled 2024-02-04 (×9): qty 60

## 2024-02-04 MED ORDER — JUVEN PO PACK
1.0000 | PACK | Freq: Two times a day (BID) | ORAL | Status: DC
  Administered 2024-02-04 – 2024-02-07 (×6): 1 via ORAL
  Filled 2024-02-04 (×6): qty 1

## 2024-02-04 NOTE — Plan of Care (Signed)
   Problem: Coping: Goal: Level of anxiety will decrease Outcome: Progressing   Problem: Elimination: Goal: Will not experience complications related to bowel motility Outcome: Progressing

## 2024-02-04 NOTE — Progress Notes (Signed)
 NG removed, tolerated well by the patient, patient able to drink second ensure.

## 2024-02-04 NOTE — Progress Notes (Signed)
 Patient able to drink entire amount of ensure.

## 2024-02-04 NOTE — Progress Notes (Signed)
 Speech Language Pathology Treatment: Dysphagia  Patient Details Name: Tony Cantu MRN: [id number] DOB: 18-Sep-1950 Today's Date: 02/04/2024 Time: 6599-3570 SLP Time Calculation (min) (ACUTE ONLY): 18 min  Assessment / Plan / Recommendation Clinical Impression  Pt seen for ongoing diet management in the setting of encephalopathy. Per chart, pt's NG removed this morning and nursing reports encephalopathy to be biggest barrier to PO intake d/t inability to follow directions. Pt had pleasantly refused food trials with her today.   Pt was awake, alert but required Total A for self-feeding d/t keeping his eyes closed throughout the session. He also refused any food trials with this Clinical research associate but was agreeable to consuming thin liquids. Skilled observation provided of pt consuming thin liquids via straw. Pt with the appearance of swift swallow response and intermittent multiple swallows. He was not responsive to cues to limit sips to 1-2 via straw, therefore physically pinching straw was helpful. At this time, would continue to recommend current diet with strict aspiration precautions and full nursing supervision and assistance with self feeding. Education provided verbally to pt's nurse.     HPI HPI: Tony Cantu is a 73 y.o. male with hx of decompensated Nash cirrhosis, HFpEF, paroxysmal A-fib on anticoagulation, diabetes, hyperlipidemia, COPD/asthma, OSA, BPH with chronic Foley, question of underlying dementia, failure to thrive, recent admission 4/11 - 5/15 with hepatic encephalopathy and GI bleeding found to have portal hypertensive gastropathy and gastric and duodenal AVM s/p APC.  Was brought in from his SNF at [facility] for increasing lethargy and altered mental status.  History is limited, per triage notes decreased p.o. times few days and increasing lethargy.  Facility did lab work with ammonia level of 113 so sent to the ED.     Discussed with his ex wife and POA Tony Cantu who was notified by SNF of  his status and plan for hospitalization. She notes concern about ability to take in PO and limited access to food / water  and him not requesting from staff. Also reports having possible dysphagia / things getting "hung up" when he swallows. Concern about patient refusal of meds and impaired capacity during periods of encephalopathy. Pt was admitted from [place]. He had a prolonged hospitalization last month at [facility] and was seen by SLP and cleared for regular textures and thin liquids at that time (April). BSE requested.      SLP Plan  Continue with current plan of care          Recommendations  Diet recommendations: Regular;Thin liquid Liquids provided via: Straw Medication Administration: Crushed with puree Supervision: Full supervision/cueing for compensatory strategies Compensations: Minimize environmental distractions;Slow rate;Small sips/bites Postural Changes and/or Swallow Maneuvers: Seated upright 90 degrees;Upright 30-60 min after meal                  Oral care BID   Frequent or constant Supervision/Assistance Dysphagia, unspecified (R13.10)     Continue with current plan of care    Tony Cantu, M.S., CCC-SLP, Tree surgeon Certified Brain Injury Specialist [facility]  [facility] Rehabilitation Services Office [phone] Ascom [phone] Fax [phone]

## 2024-02-04 NOTE — Progress Notes (Addendum)
 [facility] PROGRESS NOTE   Tony Cantu ZOX:[id number] DOB: 12/07/1950 DOA: 01/31/2024  PCP: [name], DO  Brief History: As per H&P written by Dr. Amy Kansky on 01/31/2024  Tony Cantu is a 73 y.o. male with hx of decompensated Nash cirrhosis, HFpEF, paroxysmal A-fib on anticoagulation, diabetes, hyperlipidemia, COPD/asthma, OSA, BPH with chronic Foley, question of underlying dementia, failure to thrive, recent admission 4/11 - 5/15 with hepatic encephalopathy and GI bleeding found to have portal hypertensive gastropathy and gastric and duodenal AVM s/p APC.  Was brought in from his SNF at [facility] for increasing lethargy and altered mental status.  History is limited, per triage notes decreased p.o. times few days and increasing lethargy.  Facility did lab work with ammonia level of 113 so sent to the ED.  Consultants: Palliative care  Procedures: None    Subjective/Interval History: Patient noted to be lethargic but easily arousable.  Mumbling.  He has cognitive deficits and seems to have some degree of confusion at baseline.    Assessment/Plan:  Acute metabolic encephalopathy: Multifactorial In the setting of hepatic encephalopathy with elevated ammonia level; hypoglycemia and hypernatremia. No acute source of infection appreciated Report from the skilled nursing facility expressing decreased oral intake and refusal of several his medications for the last 48 to 72 hours. Patient became significantly more lethargic and was not able to follow commands.  NG tube placed to facilitate resumption of medications  Patient noted to be on rifaximin  and lactulose .  Ammonia level has improved.  Lactulose  dose was decreased due to more than 4 bowel movements a day. Mentation has improved but will probably never return to normal because of his underlying dementia. Continue to monitor.  Poor oral intake Secondary to encephalopathy.  NG tube was placed and started on tube  feedings.   Seen by speech therapy and cleared for oral intake.   Oral intake has been poor.  Discussed with nursing staff today.  They will encourage him to consume by mouth.  If he is able to take by mouth then NG tube can be pulled out later today.  Decompensated Nash cirrhosis - Continue treatment as mentioned above with lactulose , rifaximin  and will continue PPI. - Patient on midodrine  3 times a day  Hypotension Likely secondary to cirrhosis.  On midodrine .  Blood pressure borderline low but stable.  Hypernatremia Free water  was increased yesterday with improvement in sodium levels.  Continue for now.  Hypokalemia/hypophosphatemia Continue to supplement.    Type 2 diabetes mellitus with hypoglycemia Initially noted to have hypoglycemia.  This was secondary to poor oral intake. Then he was noted to be hyperglycemic.  Likely due to tube feedings.   A1c was 4.4 in January. Glargine dose had to be adjusted for hyperglycemia.  Continue to monitor.    Chronic diastolic heart failure - Stable and compensated - Last echo with ejection fraction 60 to 65%  Paroxysmal atrial fibrillation - Currently rate controlled - Continue amiodarone  and the use of Eliquis .  Macrocytic anemia No evidence of overt bleeding.  Continue to monitor.  Chronic BPH - Patient with chronic Foley in place; this has been exchanged in the ED at time of admission - There was no signs of UTI.  COPD/asthma - No wheezing on exam - Continue bronchodilator management.  Stage II pressure injury - Appreciated in his sacrum - Present at time of admission - No signs of superimposed infection.  Chronic thrombocytopenia - No overt bleeding appreciated - Associated with cirrhosis  history. Drop in platelet counts noted.  Continue to monitor for now.  No overt bleeding noted.  Goals of care Palliative care consulted.   Mentation has improved but due to underlying dementia he will never have "normal"  mentation. The main thing will be to see if we are able to get him off of his tube feedings.  He is not a good candidate for PEG tube.  DVT Prophylaxis: On apixaban  Code Status: Full code Family Communication: No family at bedside Disposition Plan: SNF when medically stable     Medications: Scheduled:  amiodarone   100 mg Per Tube Daily   apixaban   5 mg Per Tube BID   Chlorhexidine  Gluconate Cloth  6 each Topical Q0600   feeding supplement  237 mL Oral TID BM   feeding supplement (PROSource TF20)  60 mL Per Tube Daily   folic acid   1 mg Oral Daily   free water   300 mL Per Tube Q4H   insulin  aspart  0-20 Units Subcutaneous Q4H   insulin  glargine-yfgn  20 Units Subcutaneous BID   lactulose   30 g Per Tube TID   melatonin  6 mg Per Tube QHS   midodrine   10 mg Per Tube TID with meals   montelukast   10 mg Per Tube QHS   multivitamin with minerals  1 tablet Per Tube Daily   nutrition supplement (JUVEN)  1 packet Per Tube BID BM   mouth rinse  15 mL Mouth Rinse 4 times per day   pantoprazole   40 mg Oral BID   rifaximin   550 mg Per Tube BID   sodium chloride  flush  3 mL Intravenous Q12H   thiamine   100 mg Per Tube Daily   Continuous:  feeding supplement (OSMOLITE 1.5 CAL) 1,000 mL (02/04/24 0800)   PRN:acetaminophen , albuterol , mouth rinse  Antibiotics: Anti-infectives (From admission, onward)    Start     Dose/Rate Route Frequency Ordered Stop   01/31/24 2315  rifaximin  (XIFAXAN ) tablet 550 mg        550 mg Per Tube 2 times daily 01/31/24 2304     01/31/24 2115  ceFEPIme  (MAXIPIME ) 2 g in sodium chloride  0.9 % 100 mL IVPB        2 g 200 mL/hr over 30 Minutes Intravenous  Once 01/31/24 2105 01/31/24 2200   01/31/24 2115  metroNIDAZOLE  (FLAGYL ) IVPB 500 mg        500 mg 100 mL/hr over 60 Minutes Intravenous  Once 01/31/24 2105 01/31/24 2302   01/31/24 2115  vancomycin  (VANCOCIN ) IVPB 1000 mg/200 mL premix        1,000 mg 200 mL/hr over 60 Minutes Intravenous  Once 01/31/24  2105 02/01/24 0022       Objective:  Vital Signs  Vitals:   02/04/24 0700 02/04/24 0730 02/04/24 0758 02/04/24 0800  BP: (!) 101/55 (!) 113/52 (!) 107/54 (!) 107/54  Pulse: 63 65 68 65  Resp: 20 19 (!) 23 18  Temp:   97.7 F (36.5 C)   TempSrc:   Oral   SpO2: 97% 99% 99% 99%  Weight:      Height:        Intake/Output Summary (Last 24 hours) at 02/04/2024 0852 Last data filed at 02/04/2024 0841 Gross per 24 hour  Intake 834.64 ml  Output 1900 ml  Net -1065.36 ml   Filed Weights   02/02/24 0413 02/03/24 0500 02/04/24 0419  Weight: 74.6 kg 70.1 kg 74.1 kg    General appearance: Lethargic but  arousable.  In no distress. Resp: Clear to auscultation bilaterally.  Normal effort Cardio: S1-S2 is normal regular.  No S3-S4.  No rubs murmurs or bruit GI: Abdomen is soft.  Nontender nondistended.  Bowel sounds are present normal.  No masses organomegaly Foley catheter is noted.   Lab Results:  Data Reviewed: I have personally reviewed following labs and reports of the imaging studies  CBC: Recent Labs  Lab 01/31/24 2125 02/01/24 0418 02/02/24 0439 02/04/24 0337  WBC 7.5 6.4 5.9 6.8  NEUTROABS 4.2  --   --   --   HGB 12.7* 11.3* 11.0* 10.2*  HCT 37.5* 35.5* 34.0* 32.8*  MCV 106.5* 109.9* 112.2* 113.1*  PLT 111* 86* 82* 64*    Basic Metabolic Panel: Recent Labs  Lab 01/31/24 2125 02/01/24 0418 02/02/24 0439 02/03/24 0523 02/03/24 0718 02/04/24 0337  NA 152* 148* 146*  --  152* 150*  K 2.2* 3.3* 3.2*  --  3.2* 3.7  CL 113* 112* 111  --  121* 119*  CO2 28 26 24   --  26 29  GLUCOSE 62* 364* 539*  --  285* 247*  BUN 17 14 11   --  18 21  CREATININE 0.93 0.86 0.76  --  0.64 0.62  CALCIUM  10.8* 10.1 9.2  --  9.9 9.4  MG 2.5* 2.1 2.0 2.0  --  2.0  PHOS  --  2.0* 2.2* 1.4*  --  2.6    GFR: Estimated Creatinine Clearance: 78.4 mL/min (by C-G formula based on SCr of 0.62 mg/dL).  Liver Function Tests: Recent Labs  Lab 01/31/24 2125 02/01/24 0418  02/02/24 0439  AST 53* 59* 73*  ALT 35 34 39  ALKPHOS 113 105 120  BILITOT 1.2 1.3* 0.6  PROT 6.4* 5.7* 5.3*  ALBUMIN  2.7* 2.4* 2.2*     Recent Labs  Lab 01/31/24 2125 02/01/24 0418 02/02/24 0439  AMMONIA 100* 68* 56*    Coagulation Profile: Recent Labs  Lab 01/31/24 2125  INR 1.5*    CBG: Recent Labs  Lab 02/03/24 1642 02/03/24 1945 02/03/24 2331 02/04/24 0340 02/04/24 0736  GLUCAP 460* 392* 188* 231* 262*     Recent Results (from the past 240 hours)  Urine Culture     Status: Abnormal   Collection Time: 01/26/24 12:00 PM   Specimen: Urine, Random  Result Value Ref Range Status   Specimen Description   Final    URINE, RANDOM Performed at [facility], [address], [place], [place] [address]    Special Requests   Final    NONE Reflexed from [id number] Performed at [facility], [address] [place], [place] [address]    Culture >=100,000 COLONIES/mL ESCHERICHIA COLI (A)  Final   Report Status 01/28/2024 FINAL  Final   Organism ID, Bacteria ESCHERICHIA COLI (A)  Final      Susceptibility   Escherichia coli - MIC*    AMPICILLIN >=32 RESISTANT Resistant     CEFAZOLIN  8 SENSITIVE Sensitive     CEFEPIME  <=0.12 SENSITIVE Sensitive     CEFTRIAXONE  <=0.25 SENSITIVE Sensitive     CIPROFLOXACIN  >=4 RESISTANT Resistant     GENTAMICIN <=1 SENSITIVE Sensitive     IMIPENEM <=0.25 SENSITIVE Sensitive     NITROFURANTOIN <=16 SENSITIVE Sensitive     TRIMETH/SULFA <=20 SENSITIVE Sensitive     AMPICILLIN/SULBACTAM >=32 RESISTANT Resistant     PIP/TAZO <=4 SENSITIVE Sensitive ug/mL    * >=100,000 COLONIES/mL ESCHERICHIA COLI  Resp panel by RT-PCR (RSV, Flu A&B,  Covid) Anterior Nasal Swab     Status: None   Collection Time: 01/31/24  9:05 PM   Specimen: Anterior Nasal Swab  Result Value Ref Range Status   SARS Coronavirus 2 by RT PCR NEGATIVE NEGATIVE Final    Comment: (NOTE) SARS-CoV-2 target nucleic acids are NOT DETECTED.  The SARS-CoV-2 RNA is generally  detectable in upper respiratory specimens during the acute phase of infection. The lowest concentration of SARS-CoV-2 viral copies this assay can detect is 138 copies/mL. A negative result does not preclude SARS-Cov-2 infection and should not be used as the sole basis for treatment or other patient management decisions. A negative result may occur with  improper specimen collection/handling, submission of specimen other than nasopharyngeal swab, presence of viral mutation(s) within the areas targeted by this assay, and inadequate number of viral copies(<138 copies/mL). A negative result must be combined with clinical observations, patient history, and epidemiological information. The expected result is Negative.  Fact Sheet for Patients:  BloggerCourse.com  Fact Sheet for Healthcare Providers:  SeriousBroker.it  This test is no t yet approved or cleared by the [place]  FDA and  has been authorized for detection and/or diagnosis of SARS-CoV-2 by FDA under an Emergency Use Authorization (EUA). This EUA will remain  in effect (meaning this test can be used) for the duration of the COVID-19 declaration under Section 564(b)(1) of the Act, 21 U.S.C.section 360bbb-3(b)(1), unless the authorization is terminated  or revoked sooner.       Influenza A by PCR NEGATIVE NEGATIVE Final   Influenza B by PCR NEGATIVE NEGATIVE Final    Comment: (NOTE) The Xpert Xpress SARS-CoV-2/FLU/RSV plus assay is intended as an aid in the diagnosis of influenza from Nasopharyngeal swab specimens and should not be used as a sole basis for treatment. Nasal washings and aspirates are unacceptable for Xpert Xpress SARS-CoV-2/FLU/RSV testing.  Fact Sheet for Patients: BloggerCourse.com  Fact Sheet for Healthcare Providers: SeriousBroker.it  This test is not yet approved or cleared by the [place]  FDA  and has been authorized for detection and/or diagnosis of SARS-CoV-2 by FDA under an Emergency Use Authorization (EUA). This EUA will remain in effect (meaning this test can be used) for the duration of the COVID-19 declaration under Section 564(b)(1) of the Act, 21 U.S.C. section 360bbb-3(b)(1), unless the authorization is terminated or revoked.     Resp Syncytial Virus by PCR NEGATIVE NEGATIVE Final    Comment: (NOTE) Fact Sheet for Patients: BloggerCourse.com  Fact Sheet for Healthcare Providers: SeriousBroker.it  This test is not yet approved or cleared by the [place]  FDA and has been authorized for detection and/or diagnosis of SARS-CoV-2 by FDA under an Emergency Use Authorization (EUA). This EUA will remain in effect (meaning this test can be used) for the duration of the COVID-19 declaration under Section 564(b)(1) of the Act, 21 U.S.C. section 360bbb-3(b)(1), unless the authorization is terminated or revoked.  Performed at [facility], [address], [place], [place] [address]   Blood Culture (routine x 2)     Status: None (Preliminary result)   Collection Time: 01/31/24  9:15 PM   Specimen: BLOOD  Result Value Ref Range Status   Specimen Description BLOOD LEFT ANTECUBITAL  Final   Special Requests   Final    BOTTLES DRAWN AEROBIC AND ANAEROBIC Blood Culture results may not be optimal due to an inadequate volume of blood received in culture bottles   Culture   Final    NO GROWTH 4 DAYS Performed at  [facility], [address], [place], [place] [address]    Report Status PENDING  Incomplete  Blood Culture (routine x 2)     Status: None (Preliminary result)   Collection Time: 01/31/24  9:30 PM   Specimen: BLOOD  Result Value Ref Range Status   Specimen Description BLOOD BLOOD RIGHT HAND  Final   Special Requests   Final    BOTTLES DRAWN AEROBIC AND ANAEROBIC Blood Culture adequate volume   Culture   Final     NO GROWTH 4 DAYS Performed at [facility], [address] [place], [place] [address]    Report Status PENDING  Incomplete  Urine Culture     Status: None   Collection Time: 01/31/24  9:34 PM   Specimen: Urine, Random  Result Value Ref Range Status   Specimen Description   Final    URINE, RANDOM Performed at [facility], [address] [place], [place] [address]    Special Requests   Final    NONE Reflexed from [id number] Performed at [facility], [address], [place], [place] [address]    Culture   Final    NO GROWTH Performed at [facility], [address], [place], [place] [address]    Report Status 02/02/2024 FINAL  Final  MRSA Next Gen by PCR, Nasal     Status: None   Collection Time: 02/01/24  1:23 AM   Specimen: Nasal Mucosa; Nasal Swab  Result Value Ref Range Status   MRSA by PCR Next Gen NOT DETECTED NOT DETECTED Final    Comment: (NOTE) The GeneXpert MRSA Assay (FDA approved for NASAL specimens only), is one component of a comprehensive MRSA colonization surveillance program. It is not intended to diagnose MRSA infection nor to guide or monitor treatment for MRSA infections. Test performance is not FDA approved in patients less than 18 years old. Performed at [facility], [address] [place], [place] [address]       Radiology Studies: No results found.      LOS: 4 days   Roxas Clymer [employer] on [email or web address]  02/04/2024, 8:52 AM

## 2024-02-05 DIAGNOSIS — Z7189 Other specified counseling: Secondary | ICD-10-CM | POA: Diagnosis not present

## 2024-02-05 DIAGNOSIS — Z515 Encounter for palliative care: Secondary | ICD-10-CM | POA: Diagnosis not present

## 2024-02-05 DIAGNOSIS — Z66 Do not resuscitate: Secondary | ICD-10-CM | POA: Diagnosis not present

## 2024-02-05 DIAGNOSIS — K7682 Hepatic encephalopathy: Secondary | ICD-10-CM | POA: Diagnosis not present

## 2024-02-05 DIAGNOSIS — G9341 Metabolic encephalopathy: Secondary | ICD-10-CM | POA: Diagnosis not present

## 2024-02-05 LAB — GLUCOSE, CAPILLARY
Glucose-Capillary: 100 mg/dL — ABNORMAL HIGH (ref 70–99)
Glucose-Capillary: 133 mg/dL — ABNORMAL HIGH (ref 70–99)
Glucose-Capillary: 152 mg/dL — ABNORMAL HIGH (ref 70–99)
Glucose-Capillary: 165 mg/dL — ABNORMAL HIGH (ref 70–99)
Glucose-Capillary: 258 mg/dL — ABNORMAL HIGH (ref 70–99)
Glucose-Capillary: 283 mg/dL — ABNORMAL HIGH (ref 70–99)
Glucose-Capillary: 293 mg/dL — ABNORMAL HIGH (ref 70–99)
Glucose-Capillary: 325 mg/dL — ABNORMAL HIGH (ref 70–99)

## 2024-02-05 LAB — BASIC METABOLIC PANEL WITH GFR
Anion gap: 4 — ABNORMAL LOW (ref 5–15)
BUN: 24 mg/dL — ABNORMAL HIGH (ref 8–23)
CO2: 29 mmol/L (ref 22–32)
Calcium: 9.7 mg/dL (ref 8.9–10.3)
Chloride: 121 mmol/L — ABNORMAL HIGH (ref 98–111)
Creatinine, Ser: 0.53 mg/dL — ABNORMAL LOW (ref 0.61–1.24)
GFR, Estimated: >60 mL/min (ref >60)
Glucose, Bld: 138 mg/dL — ABNORMAL HIGH (ref 70–99)
Potassium: 3.8 mmol/L (ref 3.5–5.1)
Sodium: 154 mmol/L — ABNORMAL HIGH (ref 135–145)

## 2024-02-05 LAB — CULTURE, BLOOD (ROUTINE X 2)
Culture: NO GROWTH
Culture: NO GROWTH
Special Requests: ADEQUATE

## 2024-02-05 LAB — AMMONIA: Ammonia: 45 umol/L — ABNORMAL HIGH (ref 9–35)

## 2024-02-05 MED ORDER — SODIUM CHLORIDE 0.45 % IV SOLN: INTRAVENOUS | Status: AC

## 2024-02-05 MED ORDER — DEXTROSE 5 % IV SOLN: INTRAVENOUS | Status: DC

## 2024-02-05 NOTE — Progress Notes (Signed)
 Palliative:  HPI: 73 y.o. male  with past medical history of NASH cirrhosis, HFpEF, moderate AS, paroxysmal A-fib on anticoagulation, diabetes, HLD, COPD/asthma, OSA, BPH with chronic foley, question of underlying dementia, failure to thrive, recent admission 4/11-5/15 with hepatic encephalopathy and GIB with portal hypertensive gastropathy and gastric/duodenal AVM s/p APC admitted from [facility] on 01/31/2024 with lethargy and decreased mental status with ammonia 113.   I discussed with RN. RN reports Mr. Tony Cantu is more interactive but concern for swallow safety holding food in mouth and then impulsively gulping liquids. I met with Mr. Tony Cantu - no visitors at bedside. He spoke to me but his eyes remain closed. Responses are mostly appropriate although difficult to understand at times. He seems to know he is ill but poor insight into specifics of his illness and trajectory. I did ask him about code status noting that he was previously DNR. He tells me that he would NOT want resuscitation efforts. He gives me permission to call and speak with Tony Cantu.   I was able to connect with Tony Cantu via phone. Tony Cantu is trying to support Mr. Tony Cantu but is also helping care for her sister. She is very overwhelmed. I discussed with her code status and she shares that Tony Cantu has shared with her desire for DNR/DNI status. She has good understanding of poor trajectory and expectations but worries that Tony Cantu does not. I did explain that I worry that Mr. Tony Cantu may struggle to understand and if able retaining this information may be very difficult. Tony Cantu would like to be present for conversation with doctor and Tony Cantu and reports that she is more available on Wednesday and Friday. We scheduled follow up phone conversation for tomorrow ~9-10 am.   All questions/concerns addressed. Emotional support provided. Discussed with Dr. Mason Sole.   Exam: Resting - does not open eyes. Answers most questions appropriately. Poor insight. Breathing regular,  unlabored. Abd soft. Generalized weakness and fatigue.   Plan: - DNR/DNI - Phone conference 6/10 9-10 am - Recommend consideration of hospice support  50 min  Vila Grayer, NP Palliative Medicine Team Pager [phone] (Please see [email or web address] for schedule) Team Phone [phone]

## 2024-02-05 NOTE — Plan of Care (Signed)
  Problem: Education: Goal: Knowledge of General Education information will improve Description: Including pain rating scale, medication(s)/side effects and non-pharmacologic comfort measures Outcome: Not Progressing   Problem: Health Behavior/Discharge Planning: Goal: Ability to manage health-related needs will improve Outcome: Not Progressing   Problem: Clinical Measurements: Goal: Ability to maintain clinical measurements within normal limits will improve Outcome: Not Progressing Goal: Will remain free from infection Outcome: Not Progressing Goal: Diagnostic test results will improve Outcome: Not Progressing Goal: Respiratory complications will improve Outcome: Not Progressing Goal: Cardiovascular complication will be avoided Outcome: Not Progressing   Problem: Activity: Goal: Risk for activity intolerance will decrease Outcome: Not Progressing   Problem: Nutrition: Goal: Adequate nutrition will be maintained Outcome: Progressing Note: If patient is feed    Problem: Coping: Goal: Level of anxiety will decrease Outcome: Not Progressing   Problem: Elimination: Goal: Will not experience complications related to bowel motility Outcome: Not Progressing Goal: Will not experience complications related to urinary retention Outcome: Not Progressing   Problem: Pain Managment: Goal: General experience of comfort will improve and/or be controlled Outcome: Progressing   Problem: Safety: Goal: Ability to remain free from injury will improve Outcome: Not Progressing   Problem: Skin Integrity: Goal: Risk for impaired skin integrity will decrease Outcome: Not Progressing   Problem: Education: Goal: Ability to describe self-care measures that may prevent or decrease complications (Diabetes Survival Skills Education) will improve Outcome: Not Progressing Goal: Individualized Educational Video(s) Outcome: Not Progressing   Problem: Coping: Goal: Ability to adjust to  condition or change in health will improve Outcome: Not Progressing   Problem: Fluid Volume: Goal: Ability to maintain a balanced intake and output will improve Outcome: Not Progressing   Problem: Health Behavior/Discharge Planning: Goal: Ability to identify and utilize available resources and services will improve Outcome: Not Progressing Goal: Ability to manage health-related needs will improve Outcome: Not Progressing   Problem: Metabolic: Goal: Ability to maintain appropriate glucose levels will improve Outcome: Not Progressing   Problem: Nutritional: Goal: Maintenance of adequate nutrition will improve Outcome: Progressing Goal: Progress toward achieving an optimal weight will improve Outcome: Not Progressing   Problem: Skin Integrity: Goal: Risk for impaired skin integrity will decrease Outcome: Not Progressing   Problem: Tissue Perfusion: Goal: Adequacy of tissue perfusion will improve Outcome: Not Progressing

## 2024-02-05 NOTE — Progress Notes (Signed)
 PROGRESS NOTE    Tony Cantu  ZOX:[id number] DOB: 1951/06/05 DOA: 01/31/2024 PCP: [name], DO   Brief Narrative:    Tony Cantu is a 73 y.o. male with hx of decompensated Nash cirrhosis, HFpEF, paroxysmal A-fib on anticoagulation, diabetes, hyperlipidemia, COPD/asthma, OSA, BPH with chronic Foley, question of underlying dementia, failure to thrive, recent admission 4/11 - 5/15 with hepatic encephalopathy and GI bleeding found to have portal hypertensive gastropathy and gastric and duodenal AVM s/p APC.  Was brought in from his SNF at [facility] for increasing lethargy and altered mental status.  History is limited, per triage notes decreased p.o. times few days and increasing lethargy.  Facility did lab work with ammonia level of 113 so sent to the ED.   Assessment & Plan:   Principal Problem:   Encephalopathy Active Problems:   Hepatic encephalopathy (HCC)   Dehydration   Hypernatremia   Hypokalemia   Failure to thrive in adult  Assessment and Plan:  Acute metabolic encephalopathy: Multifactorial In the setting of hepatic encephalopathy with elevated ammonia level; hypoglycemia and hypernatremia. No acute source of infection appreciated Report from the skilled nursing facility expressing decreased oral intake and refusal of several his medications for the last 48 to 72 hours. Patient became significantly more lethargic and was not able to follow commands.  NG tube placed to facilitate resumption of medications  Patient noted to be on rifaximin  and lactulose .  Ammonia level has improved.  Lactulose  dose was decreased due to more than 4 bowel movements a day. Mentation has improved but will probably never return to normal because of his underlying dementia. Continues to have persistent encephalopathy   Poor oral intake Secondary to encephalopathy.  NG tube was placed and started on tube feedings.   Seen by speech therapy and cleared for oral intake.   Oral  intake has been poor.  Discussed with nursing staff today.  They will encourage him to consume by mouth.  If he is able to take by mouth then NG tube can be pulled out later today. -Start IVF with 1/2ns with hypernatremia   Decompensated [name] cirrhosis - Continue treatment as mentioned above with lactulose , rifaximin  and will continue PPI. - Patient on midodrine  3 times a day   Hypotension Likely secondary to cirrhosis.  On midodrine .  Blood pressure borderline low but stable.   Hypernatremia Free water  was increased yesterday with improvement in sodium levels.  Continue for now. -1/2ns started 6/9   Type 2 diabetes mellitus with hypoglycemia Initially noted to have hypoglycemia.  This was secondary to poor oral intake. Then he was noted to be hyperglycemic.  Likely due to tube feedings.   A1c was 4.4 in January. Glargine dose had to be adjusted for hyperglycemia.  Continue to monitor.     Chronic diastolic heart failure - Stable and compensated - Last echo with ejection fraction 60 to 65%   Paroxysmal atrial fibrillation - Currently rate controlled - Continue amiodarone  and the use of Eliquis .   Macrocytic anemia No evidence of overt bleeding.  Continue to monitor.   Chronic BPH - Patient with chronic Foley in place; this has been exchanged in the ED at time of admission - There was no signs of UTI.   COPD/asthma - No wheezing on exam - Continue bronchodilator management.   Stage II pressure injury - Appreciated in his sacrum - Present at time of admission - No signs of superimposed infection.   Chronic thrombocytopenia - No overt  bleeding appreciated - Associated with cirrhosis history. Drop in platelet counts noted.  Continue to monitor for now.  No overt bleeding noted.   Goals of care Palliative care consulted.   Mentation has improved but due to underlying dementia he will never have "normal" mentation. The main thing will be to see if we are able to get him  off of his tube feedings.  He is not a good candidate for PEG tube.    DVT prophylaxis:apixaban  Code Status: DNR Family Communication: None at bedside Disposition Plan:  Status is: Inpatient Remains inpatient appropriate because: Need for IV fluids   Skin Assessment:  I have examined the patient's skin and I agree with the wound assessment as performed by the wound care RN as outlined below:  Pressure Injury 02/01/24 Sacrum Mid Stage 2 -  Partial thickness loss of dermis presenting as a shallow open injury with a red, pink wound bed without slough. l (Active)  02/01/24 0100  Location: Sacrum  Location Orientation: Mid  Staging: Stage 2 -  Partial thickness loss of dermis presenting as a shallow open injury with a red, pink wound bed without slough.  Wound Description (Comments): l  Present on Admission: No  Dressing Type Foam - Lift dressing to assess site every shift 02/05/24 0745    Consultants:  Palliative  Procedures:  None  Antimicrobials:  Anti-infectives (From admission, onward)    Start     Dose/Rate Route Frequency Ordered Stop   02/04/24 2200  rifaximin  (XIFAXAN ) tablet 550 mg        550 mg Oral 2 times daily 02/04/24 1142     01/31/24 2315  rifaximin  (XIFAXAN ) tablet 550 mg  Status:  Discontinued        550 mg Per Tube 2 times daily 01/31/24 2304 02/04/24 1142   01/31/24 2115  ceFEPIme  (MAXIPIME ) 2 g in sodium chloride  0.9 % 100 mL IVPB        2 g 200 mL/hr over 30 Minutes Intravenous  Once 01/31/24 2105 01/31/24 2200   01/31/24 2115  metroNIDAZOLE  (FLAGYL ) IVPB 500 mg        500 mg 100 mL/hr over 60 Minutes Intravenous  Once 01/31/24 2105 01/31/24 2302   01/31/24 2115  vancomycin  (VANCOCIN ) IVPB 1000 mg/200 mL premix        1,000 mg 200 mL/hr over 60 Minutes Intravenous  Once 01/31/24 2105 02/01/24 0022      Subjective: Patient seen and evaluated today with no new acute complaints or concerns. No acute concerns or events noted overnight. He still  remains poorly response.   Objective: Vitals:   02/05/24 1400 02/05/24 1430 02/05/24 1500 02/05/24 1525  BP: (!) 108/53 103/71 108/71   Pulse: 72 80 82   Resp: 16 (!) 23 (!) 25   Temp:    98.7 F (37.1 C)  TempSrc:    Oral  SpO2:      Weight:      Height:        Intake/Output Summary (Last 24 hours) at 02/05/2024 1541 Last data filed at 02/05/2024 1430 Gross per 24 hour  Intake 880 ml  Output 1350 ml  Net -470 ml   Filed Weights   02/03/24 0500 02/04/24 0419 02/05/24 0428  Weight: 70.1 kg 74.1 kg 73.3 kg    Examination:  General exam: Appears calm and comfortable  Respiratory system: Clear to auscultation. Respiratory effort normal. Cardiovascular system: S1 & S2 heard, RRR.  Gastrointestinal system: Abdomen is soft Central nervous  system: Alert and awake Extremities: No edema Skin: No significant lesions noted Psychiatry: Flat affect.    Data Reviewed: I have personally reviewed following labs and imaging studies  CBC: Recent Labs  Lab 01/31/24 2125 02/01/24 0418 02/02/24 0439 02/04/24 0337  WBC 7.5 6.4 5.9 6.8  NEUTROABS 4.2  --   --   --   HGB 12.7* 11.3* 11.0* 10.2*  HCT 37.5* 35.5* 34.0* 32.8*  MCV 106.5* 109.9* 112.2* 113.1*  PLT 111* 86* 82* 64*   Basic Metabolic Panel: Recent Labs  Lab 01/31/24 2125 02/01/24 0418 02/02/24 0439 02/03/24 0523 02/03/24 0718 02/04/24 0337 02/05/24 0334  NA 152* 148* 146*  --  152* 150* 154*  K 2.2* 3.3* 3.2*  --  3.2* 3.7 3.8  CL 113* 112* 111  --  121* 119* 121*  CO2 28 26 24   --  26 29 29   GLUCOSE 62* 364* 539*  --  285* 247* 138*  BUN 17 14 11   --  18 21 24*  CREATININE 0.93 0.86 0.76  --  0.64 0.62 0.53*  CALCIUM  10.8* 10.1 9.2  --  9.9 9.4 9.7  MG 2.5* 2.1 2.0 2.0  --  2.0  --   PHOS  --  2.0* 2.2* 1.4*  --  2.6  --    GFR: Estimated Creatinine Clearance: 78.4 mL/min (A) (by C-G formula based on SCr of 0.53 mg/dL (L)). Liver Function Tests: Recent Labs  Lab 01/31/24 2125 02/01/24 0418  02/02/24 0439  AST 53* 59* 73*  ALT 35 34 39  ALKPHOS 113 105 120  BILITOT 1.2 1.3* 0.6  PROT 6.4* 5.7* 5.3*  ALBUMIN  2.7* 2.4* 2.2*   No results for input(s): "LIPASE", "AMYLASE" in the last 168 hours. Recent Labs  Lab 01/31/24 2125 02/01/24 0418 02/02/24 0439 02/05/24 0334  AMMONIA 100* 68* 56* 45*   Coagulation Profile: Recent Labs  Lab 01/31/24 2125  INR 1.5*   Cardiac Enzymes: No results for input(s): "CKTOTAL", "CKMB", "CKMBINDEX", "TROPONINI" in the last 168 hours. BNP (last 3 results) No results for input(s): "PROBNP" in the last 8760 hours. HbA1C: No results for input(s): "HGBA1C" in the last 72 hours. CBG: Recent Labs  Lab 02/05/24 0004 02/05/24 0338 02/05/24 0808 02/05/24 1129 02/05/24 1523  GLUCAP 152* 133* 100* 258* 325*   Lipid Profile: No results for input(s): "CHOL", "HDL", "LDLCALC", "TRIG", "CHOLHDL", "LDLDIRECT" in the last 72 hours. Thyroid  Function Tests: No results for input(s): "TSH", "T4TOTAL", "FREET4", "T3FREE", "THYROIDAB" in the last 72 hours. Anemia Panel: No results for input(s): "VITAMINB12", "FOLATE", "FERRITIN", "TIBC", "IRON ", "RETICCTPCT" in the last 72 hours. Sepsis Labs: Recent Labs  Lab 01/31/24 2125 01/31/24 2308 02/01/24 0418  LATICACIDVEN 2.3* 4.3* 2.3*    Recent Results (from the past 240 hours)  Resp panel by RT-PCR (RSV, Flu A&B, Covid) Anterior Nasal Swab     Status: None   Collection Time: 01/31/24  9:05 PM   Specimen: Anterior Nasal Swab  Result Value Ref Range Status   SARS Coronavirus 2 by RT PCR NEGATIVE NEGATIVE Final    Comment: (NOTE) SARS-CoV-2 target nucleic acids are NOT DETECTED.  The SARS-CoV-2 RNA is generally detectable in upper respiratory specimens during the acute phase of infection. The lowest concentration of SARS-CoV-2 viral copies this assay can detect is 138 copies/mL. A negative result does not preclude SARS-Cov-2 infection and should not be used as the sole basis for treatment  or other patient management decisions. A negative result may occur with  improper  specimen collection/handling, submission of specimen other than nasopharyngeal swab, presence of viral mutation(s) within the areas targeted by this assay, and inadequate number of viral copies(<138 copies/mL). A negative result must be combined with clinical observations, patient history, and epidemiological information. The expected result is Negative.  Fact Sheet for Patients:  BloggerCourse.com  Fact Sheet for Healthcare Providers:  SeriousBroker.it  This test is no t yet approved or cleared by the [place]  FDA and  has been authorized for detection and/or diagnosis of SARS-CoV-2 by FDA under an Emergency Use Authorization (EUA). This EUA will remain  in effect (meaning this test can be used) for the duration of the COVID-19 declaration under Section 564(b)(1) of the Act, 21 U.S.C.section 360bbb-3(b)(1), unless the authorization is terminated  or revoked sooner.       Influenza A by PCR NEGATIVE NEGATIVE Final   Influenza B by PCR NEGATIVE NEGATIVE Final    Comment: (NOTE) The Xpert Xpress SARS-CoV-2/FLU/RSV plus assay is intended as an aid in the diagnosis of influenza from Nasopharyngeal swab specimens and should not be used as a sole basis for treatment. Nasal washings and aspirates are unacceptable for Xpert Xpress SARS-CoV-2/FLU/RSV testing.  Fact Sheet for Patients: BloggerCourse.com  Fact Sheet for Healthcare Providers: SeriousBroker.it  This test is not yet approved or cleared by the [place]  FDA and has been authorized for detection and/or diagnosis of SARS-CoV-2 by FDA under an Emergency Use Authorization (EUA). This EUA will remain in effect (meaning this test can be used) for the duration of the COVID-19 declaration under Section 564(b)(1) of the Act, 21 U.S.C. section  360bbb-3(b)(1), unless the authorization is terminated or revoked.     Resp Syncytial Virus by PCR NEGATIVE NEGATIVE Final    Comment: (NOTE) Fact Sheet for Patients: BloggerCourse.com  Fact Sheet for Healthcare Providers: SeriousBroker.it  This test is not yet approved or cleared by the [place]  FDA and has been authorized for detection and/or diagnosis of SARS-CoV-2 by FDA under an Emergency Use Authorization (EUA). This EUA will remain in effect (meaning this test can be used) for the duration of the COVID-19 declaration under Section 564(b)(1) of the Act, 21 U.S.C. section 360bbb-3(b)(1), unless the authorization is terminated or revoked.  Performed at [facility], [address], [place], [place] [address]   Blood Culture (routine x 2)     Status: None   Collection Time: 01/31/24  9:15 PM   Specimen: BLOOD  Result Value Ref Range Status   Specimen Description BLOOD LEFT ANTECUBITAL  Final   Special Requests   Final    BOTTLES DRAWN AEROBIC AND ANAEROBIC Blood Culture results may not be optimal due to an inadequate volume of blood received in culture bottles   Culture   Final    NO GROWTH 5 DAYS Performed at [facility], [address], [place], [place] [address]    Report Status 02/05/2024 FINAL  Final  Blood Culture (routine x 2)     Status: None   Collection Time: 01/31/24  9:30 PM   Specimen: BLOOD  Result Value Ref Range Status   Specimen Description BLOOD BLOOD RIGHT HAND  Final   Special Requests   Final    BOTTLES DRAWN AEROBIC AND ANAEROBIC Blood Culture adequate volume   Culture   Final    NO GROWTH 5 DAYS Performed at [facility], [address] [place], [place] [address]    Report Status 02/05/2024 FINAL  Final  Urine Culture     Status: None  Collection Time: 01/31/24  9:34 PM   Specimen: Urine, Random  Result Value Ref Range Status   Specimen Description   Final    URINE, RANDOM Performed at  [facility], [address], [place], [place] [address]    Special Requests   Final    NONE Reflexed from [id number] Performed at [facility], [address] [place], [place] [address]    Culture   Final    NO GROWTH Performed at [facility], [address] [place], [place] [address]    Report Status 02/02/2024 FINAL  Final  MRSA Next Gen by PCR, Nasal     Status: None   Collection Time: 02/01/24  1:23 AM   Specimen: Nasal Mucosa; Nasal Swab  Result Value Ref Range Status   MRSA by PCR Next Gen NOT DETECTED NOT DETECTED Final    Comment: (NOTE) The GeneXpert MRSA Assay (FDA approved for NASAL specimens only), is one component of a comprehensive MRSA colonization surveillance program. It is not intended to diagnose MRSA infection nor to guide or monitor treatment for MRSA infections. Test performance is not FDA approved in patients less than 37 years old. Performed at [facility], [address], [place], [place] [address]          Radiology Studies: No results found.      Scheduled Meds:  amiodarone   100 mg Oral Daily   apixaban   5 mg Oral BID   Chlorhexidine  Gluconate Cloth  6 each Topical Q0600   feeding supplement  237 mL Oral TID BM   folic acid   1 mg Oral Daily   insulin  aspart  0-20 Units Subcutaneous Q4H   insulin  glargine-yfgn  20 Units Subcutaneous BID   lactulose   30 g Oral TID   midodrine   10 mg Oral TID with meals   montelukast   10 mg Oral QHS   multivitamin with minerals  1 tablet Oral Daily   nutrition supplement (JUVEN)  1 packet Oral BID BM   mouth rinse  15 mL Mouth Rinse 4 times per day   pantoprazole   40 mg Oral BID   rifaximin   550 mg Oral BID   sodium chloride  flush  3 mL Intravenous Q12H   thiamine   100 mg Oral Daily   Continuous Infusions:  dextrose        LOS: 5 days    Time spent: 55 minutes    Quinten Allerton Loran Rock, DO [facility]  If 7PM-7AM, please contact night-coverage [email or web address] 02/05/2024, 3:41 PM

## 2024-02-05 NOTE — Progress Notes (Signed)
 Speech Language Pathology Treatment: Dysphagia  Patient Details Name: Tony PLEMMONS MRN: [id number] DOB: [birth year] Today's Date: 02/05/2024 Time: 5784-6962 SLP Time Calculation (min) (ACUTE ONLY): 15 min  Assessment / Plan / Recommendation Clinical Impression  Pt seen for ST session targeting diagnostic dysphagia therapy. Nurse reports pt had increased intake during his breakfast and consumed a bowl of oatmeal without difficulty. She states he maintained alertness during his meal after repositioning. States he had a lot of difficulty with mech soft scrambled eggs - food remained in the front of his mouth and he required a full OJ container via pinched straw to help him swallow. Pt IND grabbed a cup of milk and drank quickly with large, consecutive sips leading to coughing. Pt on 2L O2 via [place] this morning per RN but now on room air. RN also reports grimacing during PO with unknown cause.  Pt sleeping upon SLP arrival, required mod verbal and tactile stimuli to wake. He opened his eyes to command but did not keep them open. Total feeding A was provided. SLP administered ice via tsp, thin water  via tsp/cup/straw, puree applesauce via spoon, and mech soft fruit (drained) via spoon. Immediate throat clear after cup sip of thin and immediate large coughs x2 after consecutive cup sips of thin, concerning for airway invasion. Cough noted to be weak. No overt s/sx of aspiration observed with tsp or pinched straw of thin liquids. Poor A-P bolus transit of fruit and prolonged mastication observed across all trials. Attempted mixing with puree to increase bolus cohesion and placing spoon further on the tongue but these strategies were not successful. Grimacing during PO also noted but pt unable to express why and answered 'no' when asked if his throat or teeth hurt.  Given pt's varying alertness level, difficulty with mastication of mech soft trials, and poor A-P bolus transit, rec pt downgrade to Dys 1 at this time  to reduce the risk of choking/aspiration and allow pt to safely increase nutritional intake now that NG is discontinued. Provide thin liquids via pinched straw or tsp to control bolus size. Pt will continue to be followed by ST during acute stay to determine potential for diet texture upgrade. Pt may benefit from MBS as alertness improves.   HPI HPI: Tony Cantu is a 73 y.o. male with hx of decompensated Nash cirrhosis, HFpEF, paroxysmal A-fib on anticoagulation, diabetes, hyperlipidemia, COPD/asthma, OSA, BPH with chronic Foley, question of underlying dementia, failure to thrive, recent admission 4/11 - 5/15 with hepatic encephalopathy and GI bleeding found to have portal hypertensive gastropathy and gastric and duodenal AVM s/p APC.  Was brought in from his SNF at [facility] for increasing lethargy and altered mental status.  History is limited, per triage notes decreased p.o. times few days and increasing lethargy.  Facility did lab work with ammonia level of 113 so sent to the ED.     Discussed with his ex wife and POA [name] who was notified by SNF of his status and plan for hospitalization. She notes concern about ability to take in PO and limited access to food / water  and him not requesting from staff. Also reports having possible dysphagia / things getting "hung up" when he swallows. Concern about patient refusal of meds and impaired capacity during periods of encephalopathy. Pt was admitted from [place]. He had a prolonged hospitalization last month at [facility] and was seen by SLP and cleared for regular textures and thin liquids at that time (April). BSE requested.  SLP Plan  Continue with current plan of care. Pt may benefit from MBS as alertness improves.        Recommendations  Diet recommendations: Dysphagia 1 (puree);Thin liquid Liquids provided via: Teaspoon or Pinched straw Medication Administration: Crushed with puree Supervision: Full supervision/cueing for compensatory  strategies Compensations: Minimize environmental distractions;Slow rate;Small sips/bites;Follow solids with liquid Postural Changes and/or Swallow Maneuvers: Seated upright 90 degrees;Upright 30-60 min after meal                  Oral care BID   Frequent or constant Supervision/Assistance Dysphagia, unspecified (R13.10)     Other (Comment);Continue with current plan of care (Pt may benefit from MBS as alertness improves)     Caretha Chapel, MA CCC-SLP Speech-Language Pathologist 02/05/2024, 9:46 AM

## 2024-02-06 ENCOUNTER — Encounter (HOSPITAL_COMMUNITY): Payer: Self-pay | Admitting: Internal Medicine

## 2024-02-06 ENCOUNTER — Other Ambulatory Visit: Payer: Self-pay

## 2024-02-06 DIAGNOSIS — Z515 Encounter for palliative care: Secondary | ICD-10-CM | POA: Diagnosis not present

## 2024-02-06 DIAGNOSIS — G9341 Metabolic encephalopathy: Secondary | ICD-10-CM | POA: Diagnosis not present

## 2024-02-06 DIAGNOSIS — Z7189 Other specified counseling: Secondary | ICD-10-CM | POA: Diagnosis not present

## 2024-02-06 DIAGNOSIS — K7682 Hepatic encephalopathy: Secondary | ICD-10-CM | POA: Diagnosis not present

## 2024-02-06 LAB — GLUCOSE, CAPILLARY
Glucose-Capillary: 156 mg/dL — ABNORMAL HIGH (ref 70–99)
Glucose-Capillary: 160 mg/dL — ABNORMAL HIGH (ref 70–99)
Glucose-Capillary: 236 mg/dL — ABNORMAL HIGH (ref 70–99)
Glucose-Capillary: 296 mg/dL — ABNORMAL HIGH (ref 70–99)
Glucose-Capillary: 325 mg/dL — ABNORMAL HIGH (ref 70–99)
Glucose-Capillary: 47 mg/dL — ABNORMAL LOW (ref 70–99)
Glucose-Capillary: 92 mg/dL (ref 70–99)

## 2024-02-06 LAB — CBC
HCT: 35.2 % — ABNORMAL LOW (ref 39.0–52.0)
Hemoglobin: 10.7 g/dL — ABNORMAL LOW (ref 13.0–17.0)
MCH: 34.6 pg — ABNORMAL HIGH (ref 26.0–34.0)
MCHC: 30.4 g/dL (ref 30.0–36.0)
MCV: 113.9 fL — ABNORMAL HIGH (ref 80.0–100.0)
Platelets: 71 10*3/uL — ABNORMAL LOW (ref 150–400)
RBC: 3.09 MIL/uL — ABNORMAL LOW (ref 4.22–5.81)
RDW: 18.3 % — ABNORMAL HIGH (ref 11.5–15.5)
WBC: 6.2 10*3/uL (ref 4.0–10.5)
nRBC: 0 % (ref 0.0–0.2)

## 2024-02-06 LAB — BASIC METABOLIC PANEL WITH GFR
Anion gap: 6 (ref 5–15)
BUN: 24 mg/dL — ABNORMAL HIGH (ref 8–23)
CO2: 28 mmol/L (ref 22–32)
Calcium: 10.1 mg/dL (ref 8.9–10.3)
Chloride: 125 mmol/L — ABNORMAL HIGH (ref 98–111)
Creatinine, Ser: 0.66 mg/dL (ref 0.61–1.24)
GFR, Estimated: >60 mL/min (ref >60)
Glucose, Bld: 161 mg/dL — ABNORMAL HIGH (ref 70–99)
Potassium: 3.3 mmol/L — ABNORMAL LOW (ref 3.5–5.1)
Sodium: 159 mmol/L — ABNORMAL HIGH (ref 135–145)

## 2024-02-06 LAB — MAGNESIUM: Magnesium: 2.1 mg/dL (ref 1.7–2.4)

## 2024-02-06 MED ORDER — INSULIN GLARGINE-YFGN 100 UNIT/ML ~~LOC~~ SOLN
25.0000 [IU] | Freq: Every day | SUBCUTANEOUS | Status: DC
  Filled 2024-02-06 (×2): qty 0.25

## 2024-02-06 MED ORDER — ONDANSETRON HCL 4 MG/2ML IJ SOLN
4.0000 mg | Freq: Four times a day (QID) | INTRAMUSCULAR | Status: DC | PRN
  Administered 2024-02-06: 4 mg via INTRAVENOUS
  Filled 2024-02-06: qty 2

## 2024-02-06 MED ORDER — DEXTROSE 50 % IV SOLN: 25.0000 g | INTRAVENOUS | Status: AC

## 2024-02-06 MED ORDER — INSULIN ASPART 100 UNIT/ML IJ SOLN
2.0000 [IU] | Freq: Three times a day (TID) | INTRAMUSCULAR | Status: DC
  Administered 2024-02-06: 2 [IU] via SUBCUTANEOUS

## 2024-02-06 MED ORDER — DEXTROSE 50 % IV SOLN
INTRAVENOUS | Status: AC
Start: 2024-02-06 — End: 2024-02-06
  Administered 2024-02-06: 25 g via INTRAVENOUS
  Filled 2024-02-06: qty 50

## 2024-02-06 MED ORDER — INSULIN ASPART 100 UNIT/ML IJ SOLN
0.0000 [IU] | INTRAMUSCULAR | Status: DC
  Administered 2024-02-06: 5 [IU] via SUBCUTANEOUS
  Administered 2024-02-06: 7 [IU] via SUBCUTANEOUS
  Administered 2024-02-06: 2 [IU] via SUBCUTANEOUS
  Administered 2024-02-06: 3 [IU] via SUBCUTANEOUS

## 2024-02-06 MED ORDER — POTASSIUM CHLORIDE 10 MEQ/100ML IV SOLN
10.0000 meq | INTRAVENOUS | Status: AC
  Administered 2024-02-06 (×4): 10 meq via INTRAVENOUS
  Filled 2024-02-06 (×4): qty 100

## 2024-02-06 NOTE — Inpatient Diabetes Management (Signed)
 Inpatient Diabetes Program Recommendations  AACE/ADA: New Consensus Statement on Inpatient Glycemic Control (2015)  Target Ranges:  Prepandial:   less than 140 mg/dL      Peak postprandial:   less than 180 mg/dL (1-2 hours)      Critically ill patients:  140 - 180 mg/dL   Lab Results  Component Value Date   GLUCAP 156 (H) 02/06/2024   HGBA1C 4.4 (L) 09/19/2023    Review of Glycemic Control  Latest Reference Range & Units 02/05/24 20:42 02/05/24 23:46 02/06/24 04:03 02/06/24 04:23 02/06/24 07:21 02/06/24 11:13  Glucose-Capillary 70 - 99 mg/dL 161 (H) 096 (H) 47 (L) 160 (H) 92 156 (H)  (H): Data is abnormally high (L): Data is abnormally low Diabetes history: Type 2 DM Outpatient Diabetes medications: Semglee  25 units QA/8 units at bedtime, Novolog  0-9 units TID Current orders for Inpatient glycemic control: =Semglee  20 units BID, Novolog  0-20 units Q4H  Inpatient Diabetes Program Recommendations:    Noted hypoglycemia of 47 mg/dL.  Consider: -Reduction of Semglee  to 25 units QD - Reduction of correction to Novolog  0-9 units TID  - Adding Novolog  2 units TID (Assuming patient consuming >50% of meals)  Thanks, Marjo Sievert, MSN, RNC-OB Diabetes Coordinator [phone] (8a-5p)

## 2024-02-06 NOTE — Progress Notes (Addendum)
 Nutrition Follow-up  DOCUMENTATION CODES:   Severe malnutrition in context of chronic illness  INTERVENTION:   Continue Ensure Plus High Protein po TID, each supplement provides 350 kcal and 20 grams of protein  Continue Juven 1 packet po BID, each packet provides 80 calories, 8 grams of carbohydrate, 2.5  grams of protein (collagen), 7 grams of L-arginine and 7 grams of L-glutamine; supplement contains CaHMB, Vitamins C, E, B12 and Zinc to promote wound healing  Continue MVI with minerals daily  NUTRITION DIAGNOSIS:   Severe Malnutrition related to chronic illness (liver and heart failure) as evidenced by severe muscle depletion, severe fat depletion. (New Diagnosis)  GOAL:   Patient will meet greater than or equal to 90% of their needs (Met with intake of meals and supplements)  MONITOR:   PO intake, Supplement acceptance, Skin  REASON FOR ASSESSMENT:   Consult Assessment of nutrition requirement/status  ASSESSMENT:   73 y.o. male with h/o NASH cirrhosis, HFpEF, paroxysmal A-fib on anticoagulation, diabetes, hyperlipidemia, COPD/asthma, OSA, BPH with chronic Foley, HTN, HLD, GERD, pulmonary hypertension, IDA, PUD, seizures, diverticulosis, question of underlying dementia and recent admission 4/11 - 5/15 with hepatic encephalopathy and GI bleeding (found to have portal hypertensive gastropathy and gastric and duodenal AVM s/p APC) who is admitted with FTT, AMS and decompensated cirrhosis.  Patient sleeping during RD visit. He was able to answer simple questions, but did not elaborate on any answers. He stated he is eating "on and off."   NG tube removed 6/8. No longer receiving TF.   Currently on a dysphagia 1 diet with thin liquids. Average meal completion is 73% since yesterday. He requires total assistance with meals. He is receiving Ensure supplements TID and Juven BID. Per documentation, he is drinking them as ordered.   Labs reviewed. Na 159, K 3.3 CBG:  [id number]  Medications reviewed and include folic acid , novolog , semglee , lactulose , MVI with minerals, Juven, protonix , thiamine . Receiving 4 runs of IV potassium chloride  today. IVF: 1/2 NS at 100 ml/h  Admit weight: 69.1 kg Current weight 70.7 kg  Weight history reviewed. Patient weighed 85.2 kg in March 2025, now 70.7 kg. 17% weight loss x 3 months is severe. Given hx of heart and liver failure, some of this weight loss is likely r/t fluids.   Patient meets criteria for severe malnutrition, given severe depletion of muscle and subcutaneous fat mass.  NUTRITION - FOCUSED PHYSICAL EXAM:  Flowsheet Row Most Recent Value  Orbital Region Severe depletion  Upper Arm Region Moderate depletion  Thoracic and Lumbar Region Severe depletion  Buccal Region Severe depletion  Temple Region Severe depletion  Clavicle Bone Region Severe depletion  Clavicle and Acromion Bone Region Severe depletion  Scapular Bone Region Unable to assess  Dorsal Hand Moderate depletion  Patellar Region Severe depletion  Anterior Thigh Region Severe depletion  Posterior Calf Region Severe depletion  Edema (RD Assessment) None  Hair Reviewed  Eyes Unable to assess  Mouth Reviewed  Skin Reviewed  Nails Reviewed       Diet Order:   Diet Order             DIET - DYS 1 Room service appropriate? Yes; Fluid consistency: Thin  Diet effective now                   EDUCATION NEEDS:   No education needs have been identified at this time  Skin:  Skin Assessment: Skin Integrity Issues: Skin Integrity Issues:: Other (Comment), Stage II Stage  II: sacrum Other: skin tear to sacrum  Last BM:  6/10 type 6  Height:   Ht Readings from Last 1 Encounters:  02/01/24 5\' 8"  (1.727 m)    Weight:   Wt Readings from Last 1 Encounters:  02/06/24 70.7 kg    Ideal Body Weight:  70 kg  BMI:  Body mass index is 23.7 kg/m.  Estimated Nutritional Needs:   Kcal:   1900-2200kcal/day  Protein:  95-110g/day  Fluid:  1.7-2.0L/day   Barnet Boots RD, LDN, CNSC Contact via secure chat. If unavailable, use group chat "RD Inpatient."

## 2024-02-06 NOTE — Progress Notes (Signed)
 Physical Therapy Treatment Patient Details Name: Tony Cantu MRN: [id number] DOB: May 30, 1951 Today's Date: 02/06/2024   History of Present Illness Tony Cantu is a 73 y.o. male with hx of decompensated Nash cirrhosis, HFpEF, paroxysmal A-fib on anticoagulation, diabetes, hyperlipidemia, COPD/asthma, OSA, BPH with chronic Foley, question of underlying dementia, failure to thrive, recent admission 4/11 - 5/15 with hepatic encephalopathy and GI bleeding found to have portal hypertensive gastropathy and gastric and duodenal AVM s/p APC.  Was brought in from his SNF at [facility] for increasing lethargy and altered mental status.  History is limited, per triage notes decreased p.o. times few days and increasing lethargy.  Facility did lab work with ammonia level of 113 so sent to the ED.    PT Comments  Patient demonstrates slow labored movement for sitting up at bedside, poor grip strength with left hand due to contractures and unable to stand due to weakness and c/o fatigue. Patient put back to bed with Max assist for repositioning. Patient will benefit from continued skilled physical therapy in hospital and recommended venue below to increase strength, balance, endurance for safe ADLs and gait.      If plan is discharge home, recommend the following: A lot of help with bathing/dressing/bathroom;A lot of help with walking and/or transfers;Help with stairs or ramp for entrance;Assistance with cooking/housework   Can travel by private vehicle     No  Equipment Recommendations  None recommended by PT    Recommendations for Other Services       Precautions / Restrictions Precautions Precautions: Fall Recall of Precautions/Restrictions: Impaired Restrictions Weight Bearing Restrictions Per Provider Order: No     Mobility  Bed Mobility Overal bed mobility: Needs Assistance Bed Mobility: Rolling, Sidelying to Sit, Sit to Sidelying Rolling: Mod assist, Max assist Sidelying to sit:  Mod assist, Max assist     Sit to sidelying: Mod assist, Max assist General bed mobility comments: slow labored movement with limited use of left hand due to contractures    Transfers                        Ambulation/Gait                   Stairs             Wheelchair Mobility     Tilt Bed    Modified Rankin (Stroke Patients Only)       Balance Overall balance assessment: Needs assistance Sitting-balance support: Feet supported, No upper extremity supported Sitting balance-Leahy Scale: Poor Sitting balance - Comments: fair/poor seated at EOB Postural control: Left lateral lean                                  Communication Communication Communication: Impaired Factors Affecting Communication: Reduced clarity of speech  Cognition Arousal: Alert Behavior During Therapy: Flat affect   PT - Cognitive impairments: No family/caregiver present to determine baseline                         Following commands: Impaired Following commands impaired: Follows one step commands with increased time    Cueing Cueing Techniques: Verbal cues, Tactile cues  Exercises      General Comments        Pertinent Vitals/Pain Pain Assessment Pain Assessment: Faces Faces Pain Scale: Hurts little more Pain Location: pressure to  legs, arms Pain Descriptors / Indicators: Grimacing, Discomfort Pain Intervention(s): Limited activity within patient's tolerance, Monitored during session, Repositioned    Home Living                          Prior Function            PT Goals (current goals can now be found in the care plan section) Acute Rehab PT Goals Patient Stated Goal: Return to rehab PT Goal Formulation: With patient Time For Goal Achievement: 02/09/24 Potential to Achieve Goals: Good Progress towards PT goals: Progressing toward goals    Frequency    Min 2X/week      PT Plan      Co-evaluation               AM-PAC PT "6 Clicks" Mobility   Outcome Measure  Help needed turning from your back to your side while in a flat bed without using bedrails?: A Lot Help needed moving from lying on your back to sitting on the side of a flat bed without using bedrails?: A Lot Help needed moving to and from a bed to a chair (including a wheelchair)?: A Lot Help needed standing up from a chair using your arms (e.g., wheelchair or bedside chair)?: Total Help needed to walk in hospital room?: Total Help needed climbing 3-5 steps with a railing? : Total 6 Click Score: 9    End of Session   Activity Tolerance: Patient tolerated treatment well;Patient limited by fatigue Patient left: in bed;with call bell/phone within reach;with bed alarm set Nurse Communication: Mobility status PT Visit Diagnosis: Unsteadiness on feet (R26.81);Other abnormalities of gait and mobility (R26.89);Muscle weakness (generalized) (M62.81)     Time: 4098-1191 PT Time Calculation (min) (ACUTE ONLY): 19 min  Charges:    $Therapeutic Activity: 8-22 mins PT General Charges $$ ACUTE PT VISIT: 1 Visit                     4:04 PM, 02/06/24 Walton Guppy, MPT Physical Therapist with [facility] 336 [phone] office [phone] mobile phone

## 2024-02-06 NOTE — Progress Notes (Signed)
 PROGRESS NOTE    Tony Cantu  ZOX:[id number] DOB: [birth year] DOA: 01/31/2024 PCP: [name], DO   Brief Narrative:    Tony Cantu is a 73 y.o. male with hx of decompensated Nash cirrhosis, HFpEF, paroxysmal A-fib on anticoagulation, diabetes, hyperlipidemia, COPD/asthma, OSA, BPH with chronic Foley, question of underlying dementia, failure to thrive, recent admission 4/11 - 5/15 with hepatic encephalopathy and GI bleeding found to have portal hypertensive gastropathy and gastric and duodenal AVM s/p APC.  Was brought in from his SNF at [facility] for increasing lethargy and altered mental status.  History is limited, per triage notes decreased p.o. times few days and increasing lethargy.  Facility did lab work with ammonia level of 113 so sent to the ED. currently appears hospice appropriate and further meeting with spouse plan for 6/11.  Assessment & Plan:   Principal Problem:   Encephalopathy Active Problems:   Hepatic encephalopathy (HCC)   Dehydration   Hypernatremia   Hypokalemia   Failure to thrive in adult  Assessment and Plan:  Acute metabolic encephalopathy: Multifactorial In the setting of hepatic encephalopathy with elevated ammonia level; hypoglycemia and hypernatremia. No acute source of infection appreciated Report from the skilled nursing facility expressing decreased oral intake and refusal of several his medications for the last 48 to 72 hours. Patient became significantly more lethargic and was not able to follow commands.  NG tube placed to facilitate resumption of medications  Patient noted to be on rifaximin  and lactulose .  Ammonia level has improved.  Lactulose  dose was decreased due to more than 4 bowel movements a day. Mentation has improved but will probably never return to normal because of his underlying dementia. Continues to have persistent encephalopathy   Poor oral intake Secondary to encephalopathy.  NG tube was placed and started on  tube feedings.   Seen by speech therapy and cleared for oral intake.   Oral intake has been poor.  Discussed with nursing staff today.  They will encourage him to consume by mouth.  If he is able to take by mouth then NG tube can be pulled out later today. -Start IVF with 1/2ns with hypernatremia   Decompensated [name] cirrhosis - Continue treatment as mentioned above with lactulose , rifaximin  and will continue PPI. - Patient on midodrine  3 times a day   Hypotension Likely secondary to cirrhosis.  On midodrine .  Blood pressure borderline low but stable.   Hypernatremia-worsening Free water  was increased yesterday with improvement in sodium levels.  Continue for now. -1/2ns started 6/9, increase to 100 cc/h  Hypokalemia -Replete and reevaluate in a.m.   Type 2 diabetes mellitus with hypoglycemia Initially noted to have hypoglycemia.  This was secondary to poor oral intake. Then he was noted to be hyperglycemic.  Likely due to tube feedings.   A1c was 4.4 in January. Glargine dose had to be adjusted for hyperglycemia.  Continue to monitor.     Chronic diastolic heart failure - Stable and compensated - Last echo with ejection fraction 60 to 65%   Paroxysmal atrial fibrillation - Currently rate controlled - Continue amiodarone  and the use of Eliquis .   Macrocytic anemia No evidence of overt bleeding.  Continue to monitor.   Chronic BPH - Patient with chronic Foley in place; this has been exchanged in the ED at time of admission - There was no signs of UTI.   COPD/asthma - No wheezing on exam - Continue bronchodilator management.   Stage II pressure injury - Appreciated  in his sacrum - Present at time of admission - No signs of superimposed infection.   Chronic thrombocytopenia - No overt bleeding appreciated - Associated with cirrhosis history. Drop in platelet counts noted.  Continue to monitor for now.  No overt bleeding noted.   Goals of care Palliative care  consulted.   Mentation has improved but due to underlying dementia he will never have "normal" mentation. The main thing will be to see if we are able to get him off of his tube feedings.  He is not a good candidate for PEG tube. - Palliative meeting with spouse at 93 AM on 6/11 planned    DVT prophylaxis:apixaban  Code Status: DNR Family Communication: None at bedside Disposition Plan:  Status is: Inpatient Remains inpatient appropriate because: Need for IV fluids   Skin Assessment:  I have examined the patient's skin and I agree with the wound assessment as performed by the wound care RN as outlined below:  Pressure Injury 02/01/24 Sacrum Mid Stage 2 -  Partial thickness loss of dermis presenting as a shallow open injury with a red, pink wound bed without slough. l (Active)  02/01/24 0100  Location: Sacrum  Location Orientation: Mid  Staging: Stage 2 -  Partial thickness loss of dermis presenting as a shallow open injury with a red, pink wound bed without slough.  Wound Description (Comments): l  Present on Admission: No  Dressing Type Foam - Lift dressing to assess site every shift 02/05/24 2000    Consultants:  Palliative  Procedures:  None  Antimicrobials:  Anti-infectives (From admission, onward)    Start     Dose/Rate Route Frequency Ordered Stop   02/04/24 2200  rifaximin  (XIFAXAN ) tablet 550 mg        550 mg Oral 2 times daily 02/04/24 1142     01/31/24 2315  rifaximin  (XIFAXAN ) tablet 550 mg  Status:  Discontinued        550 mg Per Tube 2 times daily 01/31/24 2304 02/04/24 1142   01/31/24 2115  ceFEPIme  (MAXIPIME ) 2 g in sodium chloride  0.9 % 100 mL IVPB        2 g 200 mL/hr over 30 Minutes Intravenous  Once 01/31/24 2105 01/31/24 2200   01/31/24 2115  metroNIDAZOLE  (FLAGYL ) IVPB 500 mg        500 mg 100 mL/hr over 60 Minutes Intravenous  Once 01/31/24 2105 01/31/24 2302   01/31/24 2115  vancomycin  (VANCOCIN ) IVPB 1000 mg/200 mL premix        1,000 mg 200  mL/hr over 60 Minutes Intravenous  Once 01/31/24 2105 02/01/24 0022      Subjective: Patient seen and evaluated today with nausea noted this morning.  He appears more alert.  Spouse planning to have further discussions regarding hospice on 6/11.  Objective: Vitals:   02/06/24 0425 02/06/24 0720 02/06/24 0800 02/06/24 1111  BP:   95/66   Pulse:   64   Resp:   20   Temp:  97.9 F (36.6 C)  98.2 F (36.8 C)  TempSrc:  Oral  Oral  SpO2:   96%   Weight: 70.7 kg     Height:        Intake/Output Summary (Last 24 hours) at 02/06/2024 1148 Last data filed at 02/06/2024 1139 Gross per 24 hour  Intake 2137.61 ml  Output 1500 ml  Net 637.61 ml   Filed Weights   02/04/24 0419 02/05/24 0428 02/06/24 0425  Weight: 74.1 kg 73.3 kg 70.7 kg  Examination:  General exam: Appears calm and comfortable  Respiratory system: Clear to auscultation. Respiratory effort normal. Cardiovascular system: S1 & S2 heard, RRR.  Gastrointestinal system: Abdomen is soft Central nervous system: Alert and awake Extremities: No edema Skin: No significant lesions noted Psychiatry: Flat affect.    Data Reviewed: I have personally reviewed following labs and imaging studies  CBC: Recent Labs  Lab 01/31/24 2125 02/01/24 0418 02/02/24 0439 02/04/24 0337 02/06/24 0437  WBC 7.5 6.4 5.9 6.8 6.2  NEUTROABS 4.2  --   --   --   --   HGB 12.7* 11.3* 11.0* 10.2* 10.7*  HCT 37.5* 35.5* 34.0* 32.8* 35.2*  MCV 106.5* 109.9* 112.2* 113.1* 113.9*  PLT 111* 86* 82* 64* 71*   Basic Metabolic Panel: Recent Labs  Lab 02/01/24 0418 02/02/24 0439 02/03/24 0523 02/03/24 0718 02/04/24 0337 02/05/24 0334 02/06/24 0437  NA 148* 146*  --  152* 150* 154* 159*  K 3.3* 3.2*  --  3.2* 3.7 3.8 3.3*  CL 112* 111  --  121* 119* 121* 125*  CO2 26 24  --  26 29 29 28   GLUCOSE 364* 539*  --  285* 247* 138* 161*  BUN 14 11  --  18 21 24* 24*  CREATININE 0.86 0.76  --  0.64 0.62 0.53* 0.66  CALCIUM  10.1 9.2  --  9.9  9.4 9.7 10.1  MG 2.1 2.0 2.0  --  2.0  --  2.1  PHOS 2.0* 2.2* 1.4*  --  2.6  --   --    GFR: Estimated Creatinine Clearance: 78.4 mL/min (by C-G formula based on SCr of 0.66 mg/dL). Liver Function Tests: Recent Labs  Lab 01/31/24 2125 02/01/24 0418 02/02/24 0439  AST 53* 59* 73*  ALT 35 34 39  ALKPHOS 113 105 120  BILITOT 1.2 1.3* 0.6  PROT 6.4* 5.7* 5.3*  ALBUMIN  2.7* 2.4* 2.2*   No results for input(s): "LIPASE", "AMYLASE" in the last 168 hours. Recent Labs  Lab 01/31/24 2125 02/01/24 0418 02/02/24 0439 02/05/24 0334  AMMONIA 100* 68* 56* 45*   Coagulation Profile: Recent Labs  Lab 01/31/24 2125  INR 1.5*   Cardiac Enzymes: No results for input(s): "CKTOTAL", "CKMB", "CKMBINDEX", "TROPONINI" in the last 168 hours. BNP (last 3 results) No results for input(s): "PROBNP" in the last 8760 hours. HbA1C: No results for input(s): "HGBA1C" in the last 72 hours. CBG: Recent Labs  Lab 02/05/24 2346 02/06/24 0403 02/06/24 0423 02/06/24 0721 02/06/24 1113  GLUCAP 165* 47* 160* 92 156*   Lipid Profile: No results for input(s): "CHOL", "HDL", "LDLCALC", "TRIG", "CHOLHDL", "LDLDIRECT" in the last 72 hours. Thyroid  Function Tests: No results for input(s): "TSH", "T4TOTAL", "FREET4", "T3FREE", "THYROIDAB" in the last 72 hours. Anemia Panel: No results for input(s): "VITAMINB12", "FOLATE", "FERRITIN", "TIBC", "IRON ", "RETICCTPCT" in the last 72 hours. Sepsis Labs: Recent Labs  Lab 01/31/24 2125 01/31/24 2308 02/01/24 0418  LATICACIDVEN 2.3* 4.3* 2.3*    Recent Results (from the past 240 hours)  Resp panel by RT-PCR (RSV, Flu A&B, Covid) Anterior Nasal Swab     Status: None   Collection Time: 01/31/24  9:05 PM   Specimen: Anterior Nasal Swab  Result Value Ref Range Status   SARS Coronavirus 2 by RT PCR NEGATIVE NEGATIVE Final    Comment: (NOTE) SARS-CoV-2 target nucleic acids are NOT DETECTED.  The SARS-CoV-2 RNA is generally detectable in upper  respiratory specimens during the acute phase of infection. The lowest concentration of SARS-CoV-2 viral  copies this assay can detect is 138 copies/mL. A negative result does not preclude SARS-Cov-2 infection and should not be used as the sole basis for treatment or other patient management decisions. A negative result may occur with  improper specimen collection/handling, submission of specimen other than nasopharyngeal swab, presence of viral mutation(s) within the areas targeted by this assay, and inadequate number of viral copies(<138 copies/mL). A negative result must be combined with clinical observations, patient history, and epidemiological information. The expected result is Negative.  Fact Sheet for Patients:  BloggerCourse.com  Fact Sheet for Healthcare Providers:  SeriousBroker.it  This test is no t yet approved or cleared by the [place]  FDA and  has been authorized for detection and/or diagnosis of SARS-CoV-2 by FDA under an Emergency Use Authorization (EUA). This EUA will remain  in effect (meaning this test can be used) for the duration of the COVID-19 declaration under Section 564(b)(1) of the Act, 21 U.S.C.section 360bbb-3(b)(1), unless the authorization is terminated  or revoked sooner.       Influenza A by PCR NEGATIVE NEGATIVE Final   Influenza B by PCR NEGATIVE NEGATIVE Final    Comment: (NOTE) The Xpert Xpress SARS-CoV-2/FLU/RSV plus assay is intended as an aid in the diagnosis of influenza from Nasopharyngeal swab specimens and should not be used as a sole basis for treatment. Nasal washings and aspirates are unacceptable for Xpert Xpress SARS-CoV-2/FLU/RSV testing.  Fact Sheet for Patients: BloggerCourse.com  Fact Sheet for Healthcare Providers: SeriousBroker.it  This test is not yet approved or cleared by the [place]  FDA and has been  authorized for detection and/or diagnosis of SARS-CoV-2 by FDA under an Emergency Use Authorization (EUA). This EUA will remain in effect (meaning this test can be used) for the duration of the COVID-19 declaration under Section 564(b)(1) of the Act, 21 U.S.C. section 360bbb-3(b)(1), unless the authorization is terminated or revoked.     Resp Syncytial Virus by PCR NEGATIVE NEGATIVE Final    Comment: (NOTE) Fact Sheet for Patients: BloggerCourse.com  Fact Sheet for Healthcare Providers: SeriousBroker.it  This test is not yet approved or cleared by the [place]  FDA and has been authorized for detection and/or diagnosis of SARS-CoV-2 by FDA under an Emergency Use Authorization (EUA). This EUA will remain in effect (meaning this test can be used) for the duration of the COVID-19 declaration under Section 564(b)(1) of the Act, 21 U.S.C. section 360bbb-3(b)(1), unless the authorization is terminated or revoked.  Performed at [facility], [address], [place], [place] [address]   Blood Culture (routine x 2)     Status: None   Collection Time: 01/31/24  9:15 PM   Specimen: BLOOD  Result Value Ref Range Status   Specimen Description BLOOD LEFT ANTECUBITAL  Final   Special Requests   Final    BOTTLES DRAWN AEROBIC AND ANAEROBIC Blood Culture results may not be optimal due to an inadequate volume of blood received in culture bottles   Culture   Final    NO GROWTH 5 DAYS Performed at [facility], [address] [place], [place] [address]    Report Status 02/05/2024 FINAL  Final  Blood Culture (routine x 2)     Status: None   Collection Time: 01/31/24  9:30 PM   Specimen: BLOOD  Result Value Ref Range Status   Specimen Description BLOOD BLOOD RIGHT HAND  Final   Special Requests   Final    BOTTLES DRAWN AEROBIC AND ANAEROBIC Blood Culture adequate volume  Culture   Final    NO GROWTH 5 DAYS Performed at [facility],  [address], [place], [place] [address]    Report Status 02/05/2024 FINAL  Final  Urine Culture     Status: None   Collection Time: 01/31/24  9:34 PM   Specimen: Urine, Random  Result Value Ref Range Status   Specimen Description   Final    URINE, RANDOM Performed at [facility], [address] [place], [place] [address]    Special Requests   Final    NONE Reflexed from [id number] Performed at [facility], [name] [place], [place] [address]    Culture   Final    NO GROWTH Performed at [facility], [address] [place], [place] [address]    Report Status 02/02/2024 FINAL  Final  MRSA Next Gen by PCR, Nasal     Status: None   Collection Time: 02/01/24  1:23 AM   Specimen: Nasal Mucosa; Nasal Swab  Result Value Ref Range Status   MRSA by PCR Next Gen NOT DETECTED NOT DETECTED Final    Comment: (NOTE) The GeneXpert MRSA Assay (FDA approved for NASAL specimens only), is one component of a comprehensive MRSA colonization surveillance program. It is not intended to diagnose MRSA infection nor to guide or monitor treatment for MRSA infections. Test performance is not FDA approved in patients less than 3 years old. Performed at [facility], [address] [place], [place] [address]          Radiology Studies: No results found.      Scheduled Meds:  amiodarone   100 mg Oral Daily   apixaban   5 mg Oral BID   Chlorhexidine  Gluconate Cloth  6 each Topical Q0600   feeding supplement  237 mL Oral TID BM   folic acid   1 mg Oral Daily   insulin  aspart  0-9 Units Subcutaneous Q4H   insulin  aspart  2 Units Subcutaneous TID WC   [START ON 02/07/2024] insulin  glargine-yfgn  25 Units Subcutaneous Daily   lactulose   30 g Oral TID   midodrine   10 mg Oral TID with meals   montelukast   10 mg Oral QHS   multivitamin with minerals  1 tablet Oral Daily   nutrition supplement (JUVEN)  1 packet Oral BID BM   mouth rinse  15 mL Mouth Rinse 4 times per day   pantoprazole   40 mg  Oral BID   rifaximin   550 mg Oral BID   sodium chloride  flush  3 mL Intravenous Q12H   thiamine   100 mg Oral Daily   Continuous Infusions:  sodium chloride  100 mL/hr at 02/06/24 1139   potassium chloride  100 mL/hr at 02/06/24 1139     LOS: 6 days    Time spent: 55 minutes    Antonetta Clanton D Mason Sole, DO [facility]  If 7PM-7AM, please contact night-coverage [email or web address] 02/06/2024, 11:48 AM

## 2024-02-06 NOTE — Plan of Care (Signed)
   Problem: Education: Goal: Knowledge of General Education information will improve Description Including pain rating scale, medication(s)/side effects and non-pharmacologic comfort measures Outcome: Progressing   Problem: Education: Goal: Knowledge of General Education information will improve Description Including pain rating scale, medication(s)/side effects and non-pharmacologic comfort measures Outcome: Progressing

## 2024-02-06 NOTE — Progress Notes (Signed)
 Palliative:  HPI: 73 y.o. male  with past medical history of NASH cirrhosis, HFpEF, moderate AS, paroxysmal A-fib on anticoagulation, diabetes, HLD, COPD/asthma, OSA, BPH with chronic foley, question of underlying dementia, failure to thrive, recent admission 4/11-5/15 with hepatic encephalopathy and GIB with portal hypertensive gastropathy and gastric/duodenal AVM s/p APC admitted from [facility] on 01/31/2024 with lethargy and decreased mental status with ammonia 113.   I discussed with RN. I met with [name]. He is more alert today but very confused. He is oriented to person only. He does speak more but speech is garbled and difficult to decipher. He is unaware he is in the hospital and tells me that it is 2002. He is unable to tell me anything about his condition. I explained that his liver has been giving him problems and he has no recollection of this being an issue.   I called and discussed with [name]/HCPOA. [name] and I discussed decompensated liver cirrhosis with ongoing decline and recurrent hospital admissions. We discussed declining quality of life. We spent time exploring Tony Cantu and his wishes and what he would want if he truly understood expectations and poor prognosis. [name] shares that someone at [facility] mentioned hospice and she knows this is what we are talking about. We discussed the risks vs benefits of ongoing hospitalizations and interventions vs comfort care with hospice. We discussed the importance of asking what more Tony Cantu would want to go through knowing it is not going to get better.   [name] has good understanding of expectations but wants to ensure that we have attempted to explain to [name] his condition. She acknowledges that he is unlikely to understand but she feels like she needs to try so she can have peace of mind. She wants to have peace with her decisions and not have regrets.   [name] shares more about complicated social dynamics. Tony Cantu is currently married although his  current have has recently separated and taken herself out of the picture. [name] and Tony Cantu were married > 30 years and have a son and grandchild together. [name] shares that their son also has good understanding of the situation and she notes that he is more accepting than she has been.   All questions/concerns/addressed. Emotional support provided.   Exam: Alert and interactive. Oriented to person only. No distress. Impulsive - especially when drinking fluids. Breathing regular, unlabored. Abd soft.   Plan: - DNR/DNI - Family meeting 6/11 11am along with Dr. Mason Sole  80 min  Vila Grayer, NP Palliative Medicine Team Pager [phone] (Please see [email or web address] for schedule) Team Phone [phone]

## 2024-02-06 NOTE — Progress Notes (Signed)
 Hypoglycemic Event  CBG: 47   Treatment: D50 50 mL (25 gm)  Symptoms: Pale  Follow-up CBG: [id number] CBG Result:160   Possible Reasons for Event: Inadequate meal intake and Medication regimen:    Comments/MD notified:Opyd    Tony Cantu

## 2024-02-06 NOTE — Plan of Care (Signed)

## 2024-02-06 NOTE — Progress Notes (Addendum)
 Speech Language Pathology Treatment: Dysphagia  Patient Details Name: Tony Cantu MRN: [id number] DOB: [birth year] Today's Date: 02/06/2024 Time: 4098-1191 SLP Time Calculation (min) (ACUTE ONLY): 23 min  Assessment / Plan / Recommendation Clinical Impression  Pt seen for ST session targeting diagnostic dysphagia tx. Nurse reports pt vomited this AM after drinking OJ and received antiemetic meds approx.1 hour prior to session. States his PO intake is still minimal and he sounds gurgly with liquids. Pt with improved alertness this session compared to yesterday.  Pt initially stated he wasn't hungry but was agreeable to try PO. SLP provided total A for feeding of pt's breakfast (puree french toast via spoon, thin water  and apple juice via tsp/cup/straw). Poor A-P bolus transit with puree in approx. 50% of bites and mild-mod lingual residue, which improved with cup sip of thin. Immediate cough x2 and wet/gurgly voice with straw sips of thin even when pinched. No wet/gurgly vocal quality or other overt s/sx of airway invasion observed when tsp or small cup sips of thin were used. Pt consumed 100% of french toast and ~75% of juice.   Rec continue Dys 1 with thin liquids and compensatory strategies - no straws, give small cup/tsp sips of thin every 1-2 bites of puree, check tongue for residue. SLP updated pt's nurse on strategies. ST to continue following pt while acute.   HPI HPI: Tony Cantu is a 73 y.o. male with hx of decompensated Nash cirrhosis, HFpEF, paroxysmal A-fib on anticoagulation, diabetes, hyperlipidemia, COPD/asthma, OSA, BPH with chronic Foley, question of underlying dementia, failure to thrive, recent admission 4/11 - 5/15 with hepatic encephalopathy and GI bleeding found to have portal hypertensive gastropathy and gastric and duodenal AVM s/p APC.  Was brought in from his SNF at [facility] for increasing lethargy and altered mental status.  History is limited, per triage notes  decreased p.o. times few days and increasing lethargy.  Facility did lab work with ammonia level of 113 so sent to the ED.     Discussed with his ex wife and POA Tony Cantu who was notified by SNF of his status and plan for hospitalization. She notes concern about ability to take in PO and limited access to food / water  and him not requesting from staff. Also reports having possible dysphagia / things getting "hung up" when he swallows. Concern about patient refusal of meds and impaired capacity during periods of encephalopathy. Pt was admitted from [facility]. He had a prolonged hospitalization last month at [facility] and was seen by SLP and cleared for regular textures and thin liquids at that time (April). BSE requested.      SLP Plan  Continue with current plan of care          Recommendations  Diet recommendations: Dysphagia 1 (puree);Thin liquid via cup sip or tsp (NO STRAWS) Liquids provided via: Cup;Teaspoon;No straw Medication Administration: Crushed with puree Supervision: Full supervision/cueing for compensatory strategies Compensations: Minimize environmental distractions;Slow rate;Small sips/bites;Follow solids with liquid Postural Changes and/or Swallow Maneuvers: Seated upright 90 degrees;Upright 30-60 min after meal                  Oral care BID   Frequent or constant Supervision/Assistance Dysphagia, unspecified (R13.10)     Continue with current plan of care     Caretha Chapel, MA CCC-SLP Speech-Language Pathologist 02/06/2024, 10:30 AM

## 2024-02-07 DIAGNOSIS — G9341 Metabolic encephalopathy: Secondary | ICD-10-CM | POA: Diagnosis not present

## 2024-02-07 DIAGNOSIS — Z7189 Other specified counseling: Secondary | ICD-10-CM | POA: Diagnosis not present

## 2024-02-07 DIAGNOSIS — K7682 Hepatic encephalopathy: Secondary | ICD-10-CM | POA: Diagnosis not present

## 2024-02-07 DIAGNOSIS — Z515 Encounter for palliative care: Secondary | ICD-10-CM | POA: Diagnosis not present

## 2024-02-07 LAB — CBC
HCT: 36.3 % — ABNORMAL LOW (ref 39.0–52.0)
Hemoglobin: 11.2 g/dL — ABNORMAL LOW (ref 13.0–17.0)
MCH: 35.7 pg — ABNORMAL HIGH (ref 26.0–34.0)
MCHC: 30.9 g/dL (ref 30.0–36.0)
MCV: 115.6 fL — ABNORMAL HIGH (ref 80.0–100.0)
Platelets: 74 10*3/uL — ABNORMAL LOW (ref 150–400)
RBC: 3.14 MIL/uL — ABNORMAL LOW (ref 4.22–5.81)
RDW: 18.4 % — ABNORMAL HIGH (ref 11.5–15.5)
WBC: 6.9 10*3/uL (ref 4.0–10.5)
nRBC: 0 % (ref 0.0–0.2)

## 2024-02-07 LAB — BASIC METABOLIC PANEL WITH GFR
Anion gap: 6 (ref 5–15)
BUN: 21 mg/dL (ref 8–23)
CO2: 26 mmol/L (ref 22–32)
Calcium: 10.1 mg/dL (ref 8.9–10.3)
Chloride: 120 mmol/L — ABNORMAL HIGH (ref 98–111)
Creatinine, Ser: 0.68 mg/dL (ref 0.61–1.24)
GFR, Estimated: >60 mL/min (ref >60)
Glucose, Bld: 105 mg/dL — ABNORMAL HIGH (ref 70–99)
Potassium: 3.8 mmol/L (ref 3.5–5.1)
Sodium: 152 mmol/L — ABNORMAL HIGH (ref 135–145)

## 2024-02-07 LAB — GLUCOSE, CAPILLARY
Glucose-Capillary: 87 mg/dL (ref 70–99)
Glucose-Capillary: 71 mg/dL (ref 70–99)

## 2024-02-07 LAB — MAGNESIUM: Magnesium: 2.1 mg/dL (ref 1.7–2.4)

## 2024-02-07 MED ORDER — MORPHINE SULFATE (PF) 2 MG/ML IV SOLN
1.0000 mg | INTRAVENOUS | Status: DC | PRN
  Administered 2024-02-07 (×2): 2 mg via INTRAVENOUS
  Filled 2024-02-07 (×2): qty 1

## 2024-02-07 MED ORDER — POLYVINYL ALCOHOL 1.4 % OP SOLN: 1.0000 [drp] | Freq: Four times a day (QID) | OPHTHALMIC | Status: DC | PRN

## 2024-02-07 MED ORDER — GLYCOPYRROLATE 0.2 MG/ML IJ SOLN: 0.4000 mg | INTRAMUSCULAR | Status: DC | PRN

## 2024-02-07 MED ORDER — HALOPERIDOL LACTATE 5 MG/ML IJ SOLN: 2.0000 mg | INTRAMUSCULAR | Status: DC | PRN

## 2024-02-07 MED ORDER — DEXTROSE 5 % IV SOLN: INTRAVENOUS | Status: DC

## 2024-02-07 MED ORDER — LORAZEPAM 2 MG/ML IJ SOLN: 1.0000 mg | INTRAMUSCULAR | Status: DC | PRN

## 2024-02-07 MED ORDER — BIOTENE DRY MOUTH MT LIQD: 15.0000 mL | OROMUCOSAL | Status: DC | PRN

## 2024-02-07 NOTE — TOC Progression Note (Signed)
 Transition of Care [facility]) - Progression Note    Patient Details  Name: Tony Cantu MRN: [id number] Date of Birth: [birth year]  Transition of Care [facility]) CM/SW Contact  Orelia Binet, RN Phone Number: 02/07/2024, 12:48 PM  Clinical Narrative:   TOC consulted to reach out to [facility] to assess patient for residential hospice. Patient is from [facility], they will take him back under hospice. Family is asking for a hospice bed. Patient is comfort measures only at this time.      Expected Discharge Plan: Skilled Nursing Facility Barriers to Discharge: Continued Medical Work up  Expected Discharge Plan and Services       Living arrangements for the past 2 months: Skilled Nursing Facility                     Social Determinants of Health (SDOH) Interventions SDOH Screenings   Food Insecurity: Patient Unable To Answer (02/02/2024)  Housing: Patient Unable To Answer (02/02/2024)  Transportation Needs: Patient Unable To Answer (02/02/2024)  Utilities: Patient Unable To Answer (02/02/2024)  Depression (PHQ2-9): Medium Risk (09/13/2023)  Financial Resource Strain: Low Risk  (06/09/2022)  Social Connections: Patient Unable To Answer (02/02/2024)  Tobacco Use: Medium Risk (02/06/2024)    Readmission Risk Interventions    02/01/2024    3:00 PM  Readmission Risk Prevention Plan  Transportation Screening Complete  Medication Review (RN Care Manager) Complete  PCP or Specialist appointment within 3-5 days of discharge Not Complete  HRI or Home Care Consult Complete  SW Recovery Care/Counseling Consult Complete  Palliative Care Screening Not Complete  Skilled Nursing Facility Complete

## 2024-02-07 NOTE — Plan of Care (Signed)
  Problem: Health Behavior/Discharge Planning: Goal: Ability to manage health-related needs will improve Outcome: Progressing   Problem: Clinical Measurements: Goal: Ability to maintain clinical measurements within normal limits will improve Outcome: Progressing Goal: Will remain free from infection Outcome: Progressing Goal: Diagnostic test results will improve Outcome: Progressing Goal: Respiratory complications will improve Outcome: Progressing Goal: Cardiovascular complication will be avoided Outcome: Progressing   Problem: Nutrition: Goal: Adequate nutrition will be maintained Outcome: Progressing   Problem: Coping: Goal: Level of anxiety will decrease Outcome: Progressing   Problem: Pain Managment: Goal: General experience of comfort will improve and/or be controlled Outcome: Progressing   Problem: Skin Integrity: Goal: Risk for impaired skin integrity will decrease Outcome: Progressing

## 2024-02-07 NOTE — Progress Notes (Signed)
 PROGRESS NOTE    Tony Cantu  VWU:[id number] DOB: 15-Aug-1951 DOA: 01/31/2024 PCP: [name], DO   Brief Narrative:    Tony Cantu is a 73 y.o. male with hx of decompensated Nash cirrhosis, HFpEF, paroxysmal A-fib on anticoagulation, diabetes, hyperlipidemia, COPD/asthma, OSA, BPH with chronic Foley, question of underlying dementia, failure to thrive, recent admission 4/11 - 5/15 with hepatic encephalopathy and GI bleeding found to have portal hypertensive gastropathy and gastric and duodenal AVM s/p APC.  Was brought in from his SNF at [facility] for increasing lethargy and altered mental status.  History is limited, per triage notes decreased p.o. times few days and increasing lethargy.  Facility did lab work with ammonia level of 113 so sent to the ED. Met with family 6/11 and patient is now comfort measures only.  Awaiting hospice placement.  Assessment & Plan:   Principal Problem:   Encephalopathy Active Problems:   Hepatic encephalopathy (HCC)   Dehydration   Hypernatremia   Hypokalemia   Failure to thrive in adult  Assessment and Plan:  Acute metabolic encephalopathy: Multifactorial In the setting of hepatic encephalopathy with elevated ammonia level; hypoglycemia and hypernatremia. No acute source of infection appreciated Report from the skilled nursing facility expressing decreased oral intake and refusal of several his medications for the last 48 to 72 hours. Patient became significantly more lethargic and was not able to follow commands.  NG tube placed to facilitate resumption of medications  Patient noted to be on rifaximin  and lactulose .  Ammonia level has improved.  Lactulose  dose was decreased due to more than 4 bowel movements a day. Mentation has improved but will probably never return to normal because of his underlying dementia. Continues to have persistent encephalopathy   Poor oral intake Now on comfort care   Decompensated Nash cirrhosis -  Continue treatment as mentioned above with lactulose , rifaximin  and will continue PPI. - Patient on midodrine  3 times a day   Hypotension Likely secondary to cirrhosis.  On midodrine .  Blood pressure borderline low but stable.   Hypernatremia Due to poor oral intake   Type 2 diabetes mellitus with hypoglycemia Initially noted to have hypoglycemia.  This was secondary to poor oral intake. Then he was noted to be hyperglycemic.  Likely due to tube feedings.   A1c was 4.4 in January. Glargine dose had to be adjusted for hyperglycemia.  Continue to monitor.     Chronic diastolic heart failure - Stable and compensated - Last echo with ejection fraction 60 to 65%   Paroxysmal atrial fibrillation - Currently rate controlled - Continue amiodarone  and the use of Eliquis .   Macrocytic anemia No evidence of overt bleeding.  Continue to monitor.   Chronic BPH - Patient with chronic Foley in place; this has been exchanged in the ED at time of admission - There was no signs of UTI.   COPD/asthma - No wheezing on exam - Continue bronchodilator management.   Stage II pressure injury - Appreciated in his sacrum - Present at time of admission - No signs of superimposed infection.   Chronic thrombocytopenia - No overt bleeding appreciated - Associated with cirrhosis history. Drop in platelet counts noted.  Continue to monitor for now.  No overt bleeding noted.   Goals of care Palliative care consulted.   Discussion had with family member 6/11  Patient is now comfort care only and plans are to transition to residential hospice versus SNF with hospice care.    DVT prophylaxis:  None Code Status: DNR-comfort care Family Communication: Ex-wife and son at bedside Disposition Plan:  Status is: Inpatient Remains inpatient appropriate because: Need for IV fluids   Skin Assessment:  I have examined the patient's skin and I agree with the wound assessment as performed by the wound care  RN as outlined below:  Pressure Injury 02/01/24 Sacrum Mid Stage 2 -  Partial thickness loss of dermis presenting as a shallow open injury with a red, pink wound bed without slough. l (Active)  02/01/24 0100  Location: Sacrum  Location Orientation: Mid  Staging: Stage 2 -  Partial thickness loss of dermis presenting as a shallow open injury with a red, pink wound bed without slough.  Wound Description (Comments): l  Present on Admission: No  Dressing Type Foam - Lift dressing to assess site every shift 02/07/24 0909    Consultants:  Palliative  Procedures:  None  Antimicrobials:  Anti-infectives (From admission, onward)    Start     Dose/Rate Route Frequency Ordered Stop   02/04/24 2200  rifaximin  (XIFAXAN ) tablet 550 mg  Status:  Discontinued        550 mg Oral 2 times daily 02/04/24 1142 02/07/24 1148   01/31/24 2315  rifaximin  (XIFAXAN ) tablet 550 mg  Status:  Discontinued        550 mg Per Tube 2 times daily 01/31/24 2304 02/04/24 1142   01/31/24 2115  ceFEPIme  (MAXIPIME ) 2 g in sodium chloride  0.9 % 100 mL IVPB        2 g 200 mL/hr over 30 Minutes Intravenous  Once 01/31/24 2105 01/31/24 2200   01/31/24 2115  metroNIDAZOLE  (FLAGYL ) IVPB 500 mg        500 mg 100 mL/hr over 60 Minutes Intravenous  Once 01/31/24 2105 01/31/24 2302   01/31/24 2115  vancomycin  (VANCOCIN ) IVPB 1000 mg/200 mL premix        1,000 mg 200 mL/hr over 60 Minutes Intravenous  Once 01/31/24 2105 02/01/24 0022      Subjective: Patient seen and evaluated today with nausea noted this morning.  He appears more alert.  Spouse planning to have further discussions regarding hospice on 6/11.  Objective: Vitals:   02/06/24 2024 02/06/24 2331 02/07/24 0418 02/07/24 0439  BP: 108/73 114/66 101/69   Pulse: 61 (!) 57 66   Resp: 17 17 16    Temp: 98.6 F (37 C) 98.2 F (36.8 C) 98.2 F (36.8 C)   TempSrc:  Oral    SpO2: 96% 99% 99%   Weight:    73.7 kg  Height:        Intake/Output Summary (Last 24  hours) at 02/07/2024 1512 Last data filed at 02/07/2024 1300 Gross per 24 hour  Intake 240 ml  Output --  Net 240 ml   Filed Weights   02/05/24 0428 02/06/24 0425 02/07/24 0439  Weight: 73.3 kg 70.7 kg 73.7 kg    Examination:  General exam: Appears calm and comfortable  Respiratory system: Clear to auscultation. Respiratory effort normal. Cardiovascular system: S1 & S2 heard, RRR.  Gastrointestinal system: Abdomen is soft Central nervous system: Alert and awake Extremities: No edema Skin: No significant lesions noted Psychiatry: Flat affect.    Data Reviewed: I have personally reviewed following labs and imaging studies  CBC: Recent Labs  Lab 01/31/24 2125 02/01/24 0418 02/02/24 0439 02/04/24 0337 02/06/24 0437 02/07/24 0435  WBC 7.5 6.4 5.9 6.8 6.2 6.9  NEUTROABS 4.2  --   --   --   --   --  HGB 12.7* 11.3* 11.0* 10.2* 10.7* 11.2*  HCT 37.5* 35.5* 34.0* 32.8* 35.2* 36.3*  MCV 106.5* 109.9* 112.2* 113.1* 113.9* 115.6*  PLT 111* 86* 82* 64* 71* 74*   Basic Metabolic Panel: Recent Labs  Lab 02/01/24 0418 02/02/24 0439 02/03/24 0523 02/03/24 0718 02/04/24 0337 02/05/24 0334 02/06/24 0437 02/07/24 0435  NA 148* 146*  --  152* 150* 154* 159* 152*  K 3.3* 3.2*  --  3.2* 3.7 3.8 3.3* 3.8  CL 112* 111  --  121* 119* 121* 125* 120*  CO2 26 24  --  26 29 29 28 26   GLUCOSE 364* 539*  --  285* 247* 138* 161* 105*  BUN 14 11  --  18 21 24* 24* 21  CREATININE 0.86 0.76  --  0.64 0.62 0.53* 0.66 0.68  CALCIUM  10.1 9.2  --  9.9 9.4 9.7 10.1 10.1  MG 2.1 2.0 2.0  --  2.0  --  2.1 2.1  PHOS 2.0* 2.2* 1.4*  --  2.6  --   --   --    GFR: Estimated Creatinine Clearance: 78.4 mL/min (by C-G formula based on SCr of 0.68 mg/dL). Liver Function Tests: Recent Labs  Lab 01/31/24 2125 02/01/24 0418 02/02/24 0439  AST 53* 59* 73*  ALT 35 34 39  ALKPHOS 113 105 120  BILITOT 1.2 1.3* 0.6  PROT 6.4* 5.7* 5.3*  ALBUMIN  2.7* 2.4* 2.2*   No results for input(s): LIPASE,  AMYLASE in the last 168 hours. Recent Labs  Lab 01/31/24 2125 02/01/24 0418 02/02/24 0439 02/05/24 0334  AMMONIA 100* 68* 56* 45*   Coagulation Profile: Recent Labs  Lab 01/31/24 2125  INR 1.5*   Cardiac Enzymes: No results for input(s): CKTOTAL, CKMB, CKMBINDEX, TROPONINI in the last 168 hours. BNP (last 3 results) No results for input(s): PROBNP in the last 8760 hours. HbA1C: No results for input(s): HGBA1C in the last 72 hours. CBG: Recent Labs  Lab 02/06/24 1613 02/06/24 2026 02/06/24 2333 02/07/24 0418 02/07/24 0713  GLUCAP 236* 296* 325* 87 71   Lipid Profile: No results for input(s): CHOL, HDL, LDLCALC, TRIG, CHOLHDL, LDLDIRECT in the last 72 hours. Thyroid  Function Tests: No results for input(s): TSH, T4TOTAL, FREET4, T3FREE, THYROIDAB in the last 72 hours. Anemia Panel: No results for input(s): VITAMINB12, FOLATE, FERRITIN, TIBC, IRON , RETICCTPCT in the last 72 hours. Sepsis Labs: Recent Labs  Lab 01/31/24 2125 01/31/24 2308 02/01/24 0418  LATICACIDVEN 2.3* 4.3* 2.3*    Recent Results (from the past 240 hours)  Resp panel by RT-PCR (RSV, Flu A&B, Covid) Anterior Nasal Swab     Status: None   Collection Time: 01/31/24  9:05 PM   Specimen: Anterior Nasal Swab  Result Value Ref Range Status   SARS Coronavirus 2 by RT PCR NEGATIVE NEGATIVE Final    Comment: (NOTE) SARS-CoV-2 target nucleic acids are NOT DETECTED.  The SARS-CoV-2 RNA is generally detectable in upper respiratory specimens during the acute phase of infection. The lowest concentration of SARS-CoV-2 viral copies this assay can detect is 138 copies/mL. A negative result does not preclude SARS-Cov-2 infection and should not be used as the sole basis for treatment or other patient management decisions. A negative result may occur with  improper specimen collection/handling, submission of specimen other than nasopharyngeal swab, presence of  viral mutation(s) within the areas targeted by this assay, and inadequate number of viral copies(<138 copies/mL). A negative result must be combined with clinical observations, patient history, and epidemiological information. The  expected result is Negative.  Fact Sheet for Patients:  BloggerCourse.com  Fact Sheet for Healthcare Providers:  SeriousBroker.it  This test is no t yet approved or cleared by the [place]  FDA and  has been authorized for detection and/or diagnosis of SARS-CoV-2 by FDA under an Emergency Use Authorization (EUA). This EUA will remain  in effect (meaning this test can be used) for the duration of the COVID-19 declaration under Section 564(b)(1) of the Act, 21 U.S.C.section 360bbb-3(b)(1), unless the authorization is terminated  or revoked sooner.       Influenza A by PCR NEGATIVE NEGATIVE Final   Influenza B by PCR NEGATIVE NEGATIVE Final    Comment: (NOTE) The Xpert Xpress SARS-CoV-2/FLU/RSV plus assay is intended as an aid in the diagnosis of influenza from Nasopharyngeal swab specimens and should not be used as a sole basis for treatment. Nasal washings and aspirates are unacceptable for Xpert Xpress SARS-CoV-2/FLU/RSV testing.  Fact Sheet for Patients: BloggerCourse.com  Fact Sheet for Healthcare Providers: SeriousBroker.it  This test is not yet approved or cleared by the [place]  FDA and has been authorized for detection and/or diagnosis of SARS-CoV-2 by FDA under an Emergency Use Authorization (EUA). This EUA will remain in effect (meaning this test can be used) for the duration of the COVID-19 declaration under Section 564(b)(1) of the Act, 21 U.S.C. section 360bbb-3(b)(1), unless the authorization is terminated or revoked.     Resp Syncytial Virus by PCR NEGATIVE NEGATIVE Final    Comment: (NOTE) Fact Sheet for  Patients: BloggerCourse.com  Fact Sheet for Healthcare Providers: SeriousBroker.it  This test is not yet approved or cleared by the [place]  FDA and has been authorized for detection and/or diagnosis of SARS-CoV-2 by FDA under an Emergency Use Authorization (EUA). This EUA will remain in effect (meaning this test can be used) for the duration of the COVID-19 declaration under Section 564(b)(1) of the Act, 21 U.S.C. section 360bbb-3(b)(1), unless the authorization is terminated or revoked.  Performed at [facility], [address], [place], [place] [address]   Blood Culture (routine x 2)     Status: None   Collection Time: 01/31/24  9:15 PM   Specimen: BLOOD  Result Value Ref Range Status   Specimen Description BLOOD LEFT ANTECUBITAL  Final   Special Requests   Final    BOTTLES DRAWN AEROBIC AND ANAEROBIC Blood Culture results may not be optimal due to an inadequate volume of blood received in culture bottles   Culture   Final    NO GROWTH 5 DAYS Performed at [facility], [address] [place], [place] [address]    Report Status 02/05/2024 FINAL  Final  Blood Culture (routine x 2)     Status: None   Collection Time: 01/31/24  9:30 PM   Specimen: BLOOD  Result Value Ref Range Status   Specimen Description BLOOD BLOOD RIGHT HAND  Final   Special Requests   Final    BOTTLES DRAWN AEROBIC AND ANAEROBIC Blood Culture adequate volume   Culture   Final    NO GROWTH 5 DAYS Performed at [facility], [address] [place], [place] [address]    Report Status 02/05/2024 FINAL  Final  Urine Culture     Status: None   Collection Time: 01/31/24  9:34 PM   Specimen: Urine, Random  Result Value Ref Range Status   Specimen Description   Final    URINE, RANDOM Performed at [facility], [address] [place], [place] [address]  Special Requests   Final    NONE Reflexed from [id number] Performed at [facility], [address] [place], [place] [address]    Culture   Final    NO GROWTH Performed at [facility], [address] [place], [place] [address]    Report Status 02/02/2024 FINAL  Final  MRSA Next Gen by PCR, Nasal     Status: None   Collection Time: 02/01/24  1:23 AM   Specimen: Nasal Mucosa; Nasal Swab  Result Value Ref Range Status   MRSA by PCR Next Gen NOT DETECTED NOT DETECTED Final    Comment: (NOTE) The GeneXpert MRSA Assay (FDA approved for NASAL specimens only), is one component of a comprehensive MRSA colonization surveillance program. It is not intended to diagnose MRSA infection nor to guide or monitor treatment for MRSA infections. Test performance is not FDA approved in patients less than 89 years old. Performed at [facility], [address] [place] [address] [place], [place] [address]          Radiology Studies: No results found.      Scheduled Meds:  mouth rinse  15 mL Mouth Rinse 4 times per day   sodium chloride  flush  3 mL Intravenous Q12H     LOS: 7 days    Time spent: 55 minutes    Annelisa Ryback Loran Rock, DO [facility]  If 7PM-7AM, please contact night-coverage [email or web address] 02/07/2024, 3:12 PM

## 2024-02-07 NOTE — Progress Notes (Signed)
 Attempted to feed patient, and he just continues to spit right back out. Cannot give PO meds at this time, will continue to try and attempt meds again. Dr. Mason Sole aware of situation.

## 2024-02-07 NOTE — Progress Notes (Signed)
 Cristine Done rm [address] Civil engineer, contracting  Hospice hospital liaison note:  Received request from [facility] for family interest in [facility].   Chart reviewed and [facility] eligibility is confirmed at this time. Talked with ex-wife and HCPOA by phone to explain services and hospice philosophy of care.   Due to time of day family would prefer to wait until tomorrow, bed has been offered and accepted.   Please do not hesitate to call with any hospice related questions or concerns.   Thank you for the opportunity to participate in this patient's care.  Dwane Gitelman, Charity fundraiser, [facility] Liaison  [phone]

## 2024-02-07 NOTE — Progress Notes (Signed)
 Speech Language Pathology Treatment: Dysphagia  Patient Details Name: Tony Cantu MRN: [id number] DOB: November 17, 1950 Today's Date: 02/07/2024 Time: 1020-1050 SLP Time Calculation (min) (ACUTE ONLY): 30 min  Assessment / Plan / Recommendation Clinical Impression  Pt seen for ST session targeting diagnostic dysphagia tx. Per RN note, pt spitting out food this AM. NP reports family meeting at 1100 today to discuss goals of care. Pt sleeping in bed upon SLP arrival; required min verbal cueing to wake. Pt opening his mouth and moving his hand towards it as if holding a spoon, indicating readiness for PO. SLP provided total feeding A of puree applesauce via spoon and thin liquid water  and Boost via tsp/cup. Delayed throat clear after large cup sip of thin and immediate cough after puree, concerning for airway invasion. Pt continues to benefit from use of tsp/small cup sips to control bolus size. Provided hand under hand A for pt to feed himself puree in order to reduce aspiration risk. Occasional burping noted regardless of consistencies, possible sign of esophageal dysphagia. Pt consumed 100% of applesauce and 1/4 of Boost.   Pt's wife and son arrived at end of session. SLP provided education on diet recommendation/strategies and they verbalized understanding. Rec continue Dys 1 with thin liquids with use of the following strategies: no straws, provide thins via tsp/cup, monitor for lingual residue with puree, provide sips of thin every 1-2 bites of puree, use hand under hand as appropriate). ST to sign off as pt appears to be at highest level at this time. Pt would benefit from continued ST when he returns to his SNF to determine potential for diet texture upgrade.     HPI HPI: Tony Cantu is a 73 y.o. male with hx of decompensated Nash cirrhosis, HFpEF, paroxysmal A-fib on anticoagulation, diabetes, hyperlipidemia, COPD/asthma, OSA, BPH with chronic Foley, question of underlying dementia, failure to  thrive, recent admission 4/11 - 5/15 with hepatic encephalopathy and GI bleeding found to have portal hypertensive gastropathy and gastric and duodenal AVM s/p APC.  Was brought in from his SNF at [facility] for increasing lethargy and altered mental status.  History is limited, per triage notes decreased p.o. times few days and increasing lethargy.  Facility did lab work with ammonia level of 113 so sent to the ED.     Discussed with his ex wife and POA [name] who was notified by SNF of his status and plan for hospitalization. She notes concern about ability to take in PO and limited access to food / water  and him not requesting from staff. Also reports having possible dysphagia / things getting hung up when he swallows. Concern about patient refusal of meds and impaired capacity during periods of encephalopathy. Pt was admitted from [place]. He had a prolonged hospitalization last month at [facility] and was seen by SLP and cleared for regular textures and thin liquids at that time (April). BSE requested.      SLP Plan  Discharge SLP treatment as pt is likely at highest level at this time and will likely be discharging back to SNF soon          Recommendations  Diet recommendations: Thin liquid;Dysphagia 1 (puree) Liquids provided via: Cup;Teaspoon;No straw Medication Administration: Crushed with puree Supervision: Full supervision/cueing for compensatory strategies Compensations: Minimize environmental distractions;Slow rate;Small sips/bites;Follow solids with liquid Postural Changes and/or Swallow Maneuvers: Seated upright 90 degrees;Upright 30-60 min after meal  Oral care BID   Frequent or constant Supervision/Assistance Dysphagia, unspecified (R13.10)     Discharge SLP treatment as pt is likely at highest level at this time and will likely be discharging back to SNF soon     Caretha Chapel, MA CCC-SLP Speech-Language Pathologist 02/07/2024, 11:05 AM

## 2024-02-07 NOTE — Progress Notes (Signed)
 Palliative:  HPI: 73 y.o. male  with past medical history of NASH cirrhosis, HFpEF, moderate AS, paroxysmal A-fib on anticoagulation, diabetes, HLD, COPD/asthma, OSA, BPH with chronic foley, question of underlying dementia, failure to thrive, recent admission 4/11-5/15 with hepatic encephalopathy and GIB with portal hypertensive gastropathy and gastric/duodenal AVM s/p APC admitted from [facility] on 01/31/2024 with lethargy and decreased mental status with ammonia 113.    I met today at Tony Cantu's bedside along with Tony Cantu, son Tony Cantu, Dr. Mason Cantu, and RN. Discussed with RN and SLP prior to meeting. We discussed overall concern for medical conditions and failure to thrive. We reviewed poor quality of life and poor prognosis. He is not longer eating or drinking well. He appears more uncomfortable today with grimacing and restlessness noted. We reviewed expectation of poor prognosis regardless of interventions desired. Tony Cantu and Tony Cantu both endorse desire for comfort care at this time.   We reviewed prognosis likely days to weeks in setting of poor intake. We discussed where end of life care should take place and hospice facility is preferred to ensure comfort and symptom management during this time. We discussed medications for comfort and dose of morphine  provided. Family would like support from chaplain and they believe that Tony Cantu would like this too.   We spent time discussing and offering additional emotional support. Noted provided for son Tony Cantu for work. I left family to visit with chaplain.   All questions/concerns addressed. Emotional support provided.   Exam: Fluctuating mentation/alertness. Ongoing confusion. No distress although noted occasional grimace at times throughout visit. Breathing regular, unlabored. Abd soft. Moves all extremities.   Plan: - DNR/DNI - Full comfort care - orders changed to reflect comfort care - Transition to hospice facility for end of life care  70  min  Tony Grayer, NP Palliative Medicine Team Pager [phone] (Please see [email or web address] for schedule) Team Phone [phone]

## 2024-02-08 DIAGNOSIS — Z515 Encounter for palliative care: Secondary | ICD-10-CM | POA: Diagnosis not present

## 2024-02-08 DIAGNOSIS — K7682 Hepatic encephalopathy: Secondary | ICD-10-CM | POA: Diagnosis not present

## 2024-02-08 DIAGNOSIS — G9341 Metabolic encephalopathy: Secondary | ICD-10-CM | POA: Diagnosis not present

## 2024-02-08 DIAGNOSIS — Z7189 Other specified counseling: Secondary | ICD-10-CM | POA: Diagnosis not present

## 2024-02-08 LAB — GLUCOSE, CAPILLARY: Glucose-Capillary: 465 mg/dL — ABNORMAL HIGH (ref 70–99)

## 2024-02-08 NOTE — Plan of Care (Signed)
  Problem: Education: Goal: Knowledge of General Education information will improve Description: Including pain rating scale, medication(s)/side effects and non-pharmacologic comfort measures 02/08/2024 0459 by Jerie Montana, RN Outcome: Progressing 02/08/2024 0459 by Jerie Montana, RN Outcome: Progressing   Problem: Health Behavior/Discharge Planning: Goal: Ability to manage health-related needs will improve Outcome: Progressing   Problem: Clinical Measurements: Goal: Ability to maintain clinical measurements within normal limits will improve Outcome: Progressing Goal: Will remain free from infection Outcome: Progressing Goal: Diagnostic test results will improve Outcome: Progressing Goal: Respiratory complications will improve Outcome: Progressing Goal: Cardiovascular complication will be avoided Outcome: Progressing   Problem: Activity: Goal: Risk for activity intolerance will decrease Outcome: Progressing   Problem: Nutrition: Goal: Adequate nutrition will be maintained Outcome: Progressing   Problem: Coping: Goal: Level of anxiety will decrease Outcome: Progressing   Problem: Elimination: Goal: Will not experience complications related to bowel motility Outcome: Progressing Goal: Will not experience complications related to urinary retention Outcome: Progressing   Problem: Pain Managment: Goal: General experience of comfort will improve and/or be controlled Outcome: Progressing   Problem: Skin Integrity: Goal: Risk for impaired skin integrity will decrease Outcome: Progressing   Problem: Education: Goal: Ability to describe self-care measures that may prevent or decrease complications (Diabetes Survival Skills Education) will improve Outcome: Progressing Goal: Individualized Educational Video(s) Outcome: Progressing   Problem: Coping: Goal: Ability to adjust to condition or change in health will improve Outcome: Progressing   Problem: Fluid  Volume: Goal: Ability to maintain a balanced intake and output will improve Outcome: Progressing   Problem: Health Behavior/Discharge Planning: Goal: Ability to identify and utilize available resources and services will improve Outcome: Progressing Goal: Ability to manage health-related needs will improve Outcome: Progressing   Problem: Metabolic: Goal: Ability to maintain appropriate glucose levels will improve Outcome: Progressing   Problem: Nutritional: Goal: Maintenance of adequate nutrition will improve Outcome: Progressing Goal: Progress toward achieving an optimal weight will improve Outcome: Progressing   Problem: Skin Integrity: Goal: Risk for impaired skin integrity will decrease Outcome: Progressing   Problem: Tissue Perfusion: Goal: Adequacy of tissue perfusion will improve Outcome: Progressing   Problem: Education: Goal: Knowledge of the prescribed therapeutic regimen will improve Outcome: Progressing   Problem: Coping: Goal: Ability to identify and develop effective coping behavior will improve Outcome: Progressing   Problem: Clinical Measurements: Goal: Quality of life will improve Outcome: Progressing   Problem: Respiratory: Goal: Verbalizations of increased ease of respirations will increase Outcome: Progressing   Problem: Role Relationship: Goal: Family's ability to cope with current situation will improve Outcome: Progressing Goal: Ability to verbalize concerns, feelings, and thoughts to partner or family member will improve Outcome: Progressing   Problem: Pain Management: Goal: Satisfaction with pain management regimen will improve Outcome: Progressing

## 2024-02-08 NOTE — Progress Notes (Signed)
 Palliative:  HPI: 73 y.o. male with past medical history of NASH cirrhosis, HFpEF, moderate AS, paroxysmal A-fib on anticoagulation, diabetes, HLD, COPD/asthma, OSA, BPH with chronic foley, question of underlying dementia, failure to thrive, recent admission 4/11-5/15 with hepatic encephalopathy and GIB with portal hypertensive gastropathy and gastric/duodenal AVM s/p APC admitted from [facility] on 01/31/2024 with lethargy and decreased mental status with ammonia 113.   I met today with Bill. No family at bedside. He is more awake and interactive at time of my visit although still with confusion. He asks about the plan for the day and I share that we are working to get him somewhere where he can focus on resting and being comfortable. He says let's get a move on that! He has a [employer] left for him at bedside and I provide him with some. He is impulsive with drinking and needs to be slowed down. He does exhibit coughing and ongoing aspiration risk. He has no interest in anything besides P[employer]. Plans for transition to [facility] today.   Bill's mentation does fluctuate. He is more alert right now but I doubt this will last for long. He still has fluctuations in my visit today where he has appropriate responses that I understand, garbled speech, and then speaking gibberish. Prognosis remains poor due to failure to thrive. Comfort care still appropriate. Hospice facility still appropriate due to ongoing decline and expected prognosis days to weeks with transition to full comfort care.   Emotional support provided.   Exam: More awake/alert. Appropriate responses at times but still overall confused. Animated expressions at times. No distress. Cough with intake - anticipate aspiration. Abd soft.   Plan: - DNR/DNI - Full comfort care - Transition to [facility]  35 min  Vila Grayer, NP Palliative Medicine Team Pager [phone] (Please see [email or web address] for schedule) Team Phone [phone]

## 2024-02-08 NOTE — Discharge Summary (Signed)
 Physician Discharge Summary  Tony Cantu WUJ:[id number] DOB: October 16, 1950 DOA: 01/31/2024  PCP: [name], DO  Admit date: 01/31/2024  Discharge date: 02/08/2024  Admitted From:Home  Disposition: Hospice facility  Recommendations for Outpatient Follow-up:  Follow up with hospice facility  Home Health: None  Equipment/Devices: None  Discharge Condition:Stable  CODE STATUS: DNR-comfort care  Diet recommendation: Pleasure feeds  Brief/Interim Summary: Tony Cantu is a 73 y.o. male with hx of decompensated Nash cirrhosis, HFpEF, paroxysmal A-fib on anticoagulation, diabetes, hyperlipidemia, COPD/asthma, OSA, BPH with chronic Foley, question of underlying dementia, failure to thrive, recent admission 4/11 - 5/15 with hepatic encephalopathy and GI bleeding found to have portal hypertensive gastropathy and gastric and duodenal AVM s/p APC.  Was brought in from his SNF at [facility] for increasing lethargy and altered mental status.  History is limited, per triage notes decreased p.o. times few days and increasing lethargy.  Facility did lab work with ammonia level of 113 so sent to the ED. He was treated with lactulose  for his encephalopathy and had improvement in ammonia levels noted, but unfortunately continued to remain quite encephalopathic.  He was noted to have multiple electrolyte abnormalities to include hypernatremia as well as issues with hypoglycemia in the setting of poor oral intake.  He was noted to have failure to thrive and overall was noted to have very poor prognosis in the short-term.  He was seen by palliative care and discussions were had with family members regarding the need to transition to comfort care and this was done on 6/11.  He is now stable for transfer to hospice home with a prognosis of days to weeks.  Discharge Diagnoses:  Principal Problem:   Encephalopathy Active Problems:   Hepatic encephalopathy (HCC)   Dehydration   Hypernatremia    Hypokalemia   Failure to thrive in adult  Principal discharge diagnosis: Acute metabolic encephalopathy likely hepatic, but overall multifactorial.  Failure to thrive with multiple medical comorbidities.  Discharge Instructions  Discharge Instructions     Diet - low sodium heart healthy   Complete by: As directed    Increase activity slowly   Complete by: As directed    No wound care   Complete by: As directed       Allergies as of 02/08/2024       Reactions   Ak-mycin [erythromycin] Diarrhea   [employer] [facility] Cold-cough Other (See Comments)   Chest discomfort  Not listed on MAR from facility   Sulfonamide Derivatives Diarrhea        Medication List     STOP taking these medications    acetaminophen  500 MG tablet Commonly known as: TYLENOL    amiodarone  100 MG tablet Commonly known as: PACERONE    apixaban  5 MG Tabs tablet Commonly known as: ELIQUIS    atorvastatin  40 MG tablet Commonly known as: LIPITOR   b complex vitamins tablet   cefTRIAXone  1 g injection Commonly known as: ROCEPHIN    dextromethorphan-guaiFENesin 10-100 MG/5ML liquid Commonly known as: ROBITUSSIN-DM   ergocalciferol  1.25 MG (50000 UT) capsule Commonly known as: VITAMIN D2   feeding supplement (GLUCERNA SHAKE) Liqd   ferrous sulfate  325 (65 FE) MG EC tablet   Glucagon  Emergency 1 MG Kit   Glucerna Liqd   insulin  glargine-yfgn 100 UNIT/ML injection Commonly known as: SEMGLEE    ipratropium-albuterol  0.5-2.5 (3) MG/3ML Soln Commonly known as: DUONEB   lactulose  10 GM/15ML solution Commonly known as: CHRONULAC    magnesium  oxide 400 (240 Mg) MG tablet Commonly known as:  MAG-OX   melatonin 3 MG Tabs tablet   midodrine  10 MG tablet Commonly known as: PROAMATINE    mirtazapine  15 MG disintegrating tablet Commonly known as: REMERON  SOL-TAB   montelukast  10 MG tablet Commonly known as: SINGULAIR    multivitamin with minerals tablet   NovoLOG  FlexPen 100 UNIT/ML  FlexPen Generic drug: insulin  aspart   nutrition supplement (JUVEN) Pack   pantoprazole  40 MG tablet Commonly known as: PROTONIX    rifaximin  550 MG Tabs tablet Commonly known as: XIFAXAN         Contact information for after-discharge care     Destination     [facility] .   Service: Skilled Nursing Contact information: [address] [place]  [address] [phone]                    Allergies  Allergen Reactions   Ak-Mycin [Erythromycin] Diarrhea   [employer] [facility] Cold-Cough Other (See Comments)    Chest discomfort  Not listed on MAR from facility   Sulfonamide Derivatives Diarrhea    Consultations: Palliative care   Procedures/Studies: CT HEAD WO CONTRAST ( ) Result Date: 02/01/2024 EXAM: CT HEAD WITHOUT CONTRAST 01/31/2024 11:43:48 PM TECHNIQUE: CT of the head was performed without the administration of intravenous contrast. Automated exposure control, iterative reconstruction, and/or weight based adjustment of the mA/kV was utilized to reduce the radiation dose to as low as reasonably achievable. COMPARISON: 12/08/2023 CLINICAL HISTORY: Encephalopathy on anticoag. R/o ICH. Pt bib [facility], per facility pt has not had normal po intake the past few days. Facility did lab work and found his ammonia to be 113. Hx of Hepatic encephalopathy. Per EMS nurse at facility said pt was A\T\Ox4 2 weeks ago when she last took care of him but has very lethargic today. FINDINGS: BRAIN AND VENTRICLES: There is no acute intracranial hemorrhage, mass effect or midline shift. No abnormal extra-axial fluid collection. The gray-white differentiation is maintained without an acute infarct. There is no hydrocephalus. Mild subcortical and periventricular small vessel ischemic changes. ORBITS: The visualized portion of the orbits demonstrate no acute abnormality. SINUSES: The visualized paranasal sinuses and mastoid air  cells demonstrate no acute abnormality. SOFT TISSUES AND SKULL: No acute abnormality of the visualized skull or soft tissues. IMPRESSION: 1. No acute intracranial abnormality. Electronically signed by: Zadie Herter MD 02/01/2024 12:04 AM EDT RP Workstation: [id number]   DG Abd Portable 1 View Result Date: 01/31/2024 CLINICAL DATA:  NG tube EXAM: PORTABLE ABDOMEN - 1 VIEW COMPARISON:  Abdominal x-ray 12/08/2023 FINDINGS: Enteric tube tip is in the proximal body of the stomach. IMPRESSION: Enteric tube tip is in the proximal body of the stomach. Electronically Signed   By: Tyron Gallon M.D.   On: 01/31/2024 22:55   DG Chest Port 1 View Result Date: 01/31/2024 CLINICAL DATA:  Questionable sepsis - evaluate for abnormality EXAM: PORTABLE CHEST 1 VIEW COMPARISON:  12/11/2023 FINDINGS: Chronic elevation of right hemidiaphragm. Stable heart size and mediastinal contours. Mild interstitial coarsening. No focal airspace disease. No pleural effusion or pneumothorax. Right-sided battery pack with lead coursing into the neck. Bilateral shoulder arthroplasties. Remote left rib fracture. IMPRESSION: Mild interstitial coarsening, may represent bronchial inflammation. No focal airspace disease. Electronically Signed   By: Chadwick Colonel M.D.   On: 01/31/2024 21:26     Discharge Exam: Vitals:   02/07/24 0418 02/08/24 0500  BP: 101/69 111/61  Pulse: 66 73  Resp: 16 17  Temp: 98.2 F (36.8 C) 97.7 F (36.5  C)  SpO2: 99%    Vitals:   02/06/24 2331 02/07/24 0418 02/07/24 0439 02/08/24 0500  BP: 114/66 101/69  111/61  Pulse: (!) 57 66  73  Resp: 17 16  17   Temp: 98.2 F (36.8 C) 98.2 F (36.8 C)  97.7 F (36.5 C)  TempSrc: Oral   Axillary  SpO2: 99% 99%    Weight:   73.7 kg   Height:        General: Pt is alert, awake, not in acute distress Cardiovascular: RRR, S1/S2 +, no rubs, no gallops Respiratory: CTA bilaterally, no wheezing, no rhonchi Abdominal: Soft, NT, ND, bowel sounds  + Extremities: no edema, no cyanosis    The results of significant diagnostics from this hospitalization (including imaging, microbiology, ancillary and laboratory) are listed below for reference.     Microbiology: Recent Results (from the past 240 hours)  Resp panel by RT-PCR (RSV, Flu A&B, Covid) Anterior Nasal Swab     Status: None   Collection Time: 01/31/24  9:05 PM   Specimen: Anterior Nasal Swab  Result Value Ref Range Status   SARS Coronavirus 2 by RT PCR NEGATIVE NEGATIVE Final    Comment: (NOTE) SARS-CoV-2 target nucleic acids are NOT DETECTED.  The SARS-CoV-2 RNA is generally detectable in upper respiratory specimens during the acute phase of infection. The lowest concentration of SARS-CoV-2 viral copies this assay can detect is 138 copies/mL. A negative result does not preclude SARS-Cov-2 infection and should not be used as the sole basis for treatment or other patient management decisions. A negative result may occur with  improper specimen collection/handling, submission of specimen other than nasopharyngeal swab, presence of viral mutation(s) within the areas targeted by this assay, and inadequate number of viral copies(<138 copies/mL). A negative result must be combined with clinical observations, patient history, and epidemiological information. The expected result is Negative.  Fact Sheet for Patients:  BloggerCourse.com  Fact Sheet for Healthcare Providers:  SeriousBroker.it  This test is no t yet approved or cleared by the [place]  FDA and  has been authorized for detection and/or diagnosis of SARS-CoV-2 by FDA under an Emergency Use Authorization (EUA). This EUA will remain  in effect (meaning this test can be used) for the duration of the COVID-19 declaration under Section 564(b)(1) of the Act, 21 U.S.C.section 360bbb-3(b)(1), unless the authorization is terminated  or revoked sooner.        Influenza A by PCR NEGATIVE NEGATIVE Final   Influenza B by PCR NEGATIVE NEGATIVE Final    Comment: (NOTE) The Xpert Xpress SARS-CoV-2/FLU/RSV plus assay is intended as an aid in the diagnosis of influenza from Nasopharyngeal swab specimens and should not be used as a sole basis for treatment. Nasal washings and aspirates are unacceptable for Xpert Xpress SARS-CoV-2/FLU/RSV testing.  Fact Sheet for Patients: BloggerCourse.com  Fact Sheet for Healthcare Providers: SeriousBroker.it  This test is not yet approved or cleared by the [place]  FDA and has been authorized for detection and/or diagnosis of SARS-CoV-2 by FDA under an Emergency Use Authorization (EUA). This EUA will remain in effect (meaning this test can be used) for the duration of the COVID-19 declaration under Section 564(b)(1) of the Act, 21 U.S.C. section 360bbb-3(b)(1), unless the authorization is terminated or revoked.     Resp Syncytial Virus by PCR NEGATIVE NEGATIVE Final    Comment: (NOTE) Fact Sheet for Patients: BloggerCourse.com  Fact Sheet for Healthcare Providers: SeriousBroker.it  This test is not yet approved or cleared by the [place]  [place] FDA and has been authorized for detection and/or diagnosis of SARS-CoV-2 by FDA under an Emergency Use Authorization (EUA). This EUA will remain in effect (meaning this test can be used) for the duration of the COVID-19 declaration under Section 564(b)(1) of the Act, 21 U.S.C. section 360bbb-3(b)(1), unless the authorization is terminated or revoked.  Performed at [facility], [address] [place], [place] [address]   Blood Culture (routine x 2)     Status: None   Collection Time: 01/31/24  9:15 PM   Specimen: BLOOD  Result Value Ref Range Status   Specimen Description BLOOD LEFT ANTECUBITAL  Final   Special Requests   Final    BOTTLES DRAWN AEROBIC AND  ANAEROBIC Blood Culture results may not be optimal due to an inadequate volume of blood received in culture bottles   Culture   Final    NO GROWTH 5 DAYS Performed at [facility], [address] [place], [place] [address]    Report Status 02/05/2024 FINAL  Final  Blood Culture (routine x 2)     Status: None   Collection Time: 01/31/24  9:30 PM   Specimen: BLOOD  Result Value Ref Range Status   Specimen Description BLOOD BLOOD RIGHT HAND  Final   Special Requests   Final    BOTTLES DRAWN AEROBIC AND ANAEROBIC Blood Culture adequate volume   Culture   Final    NO GROWTH 5 DAYS Performed at [facility], [address] [place], [place] [address]    Report Status 02/05/2024 FINAL  Final  Urine Culture     Status: None   Collection Time: 01/31/24  9:34 PM   Specimen: Urine, Random  Result Value Ref Range Status   Specimen Description   Final    URINE, RANDOM Performed at [facility], [address], [place], [place] [address]    Special Requests   Final    NONE Reflexed from [id number] Performed at [facility], [address] [place], [place] [address]    Culture   Final    NO GROWTH Performed at [facility], [address] [place], [place] [address]    Report Status 02/02/2024 FINAL  Final  MRSA Next Gen by PCR, Nasal     Status: None   Collection Time: 02/01/24  1:23 AM   Specimen: Nasal Mucosa; Nasal Swab  Result Value Ref Range Status   MRSA by PCR Next Gen NOT DETECTED NOT DETECTED Final    Comment: (NOTE) The GeneXpert MRSA Assay (FDA approved for NASAL specimens only), is one component of a comprehensive MRSA colonization surveillance program. It is not intended to diagnose MRSA infection nor to guide or monitor treatment for MRSA infections. Test performance is not FDA approved in patients less than 24 years old. Performed at [facility], [address], [place], [place] [address]      Labs: BNP (last 3 results) Recent Labs    12/11/23 0331  12/12/23 0343 12/13/23 0339  BNP 351.3* 388.1* 334.3*   Basic Metabolic Panel: Recent Labs  Lab 02/02/24 0439 02/03/24 0523 02/03/24 0718 02/04/24 0337 02/05/24 0334 02/06/24 0437 02/07/24 0435  NA 146*  --  152* 150* 154* 159* 152*  K 3.2*  --  3.2* 3.7 3.8 3.3* 3.8  CL 111  --  121* 119* 121* 125* 120*  CO2 24  --  26 29 29 28 26   GLUCOSE 539*  --  285* 247* 138* 161* 105*  BUN 11  --  18 21 24* 24* 21  CREATININE 0.76  --  0.64 0.62 0.53* 0.66 0.68  CALCIUM  9.2  --  9.9 9.4 9.7 10.1 10.1  MG 2.0 2.0  --  2.0  --  2.1 2.1  PHOS 2.2* 1.4*  --  2.6  --   --   --    Liver Function Tests: Recent Labs  Lab 02/02/24 0439  AST 73*  ALT 39  ALKPHOS 120  BILITOT 0.6  PROT 5.3*  ALBUMIN  2.2*   No results for input(s): LIPASE, AMYLASE in the last 168 hours. Recent Labs  Lab 02/02/24 0439 02/05/24 0334  AMMONIA 56* 45*   CBC: Recent Labs  Lab 02/02/24 0439 02/04/24 0337 02/06/24 0437 02/07/24 0435  WBC 5.9 6.8 6.2 6.9  HGB 11.0* 10.2* 10.7* 11.2*  HCT 34.0* 32.8* 35.2* 36.3*  MCV 112.2* 113.1* 113.9* 115.6*  PLT 82* 64* 71* 74*   Cardiac Enzymes: No results for input(s): CKTOTAL, CKMB, CKMBINDEX, TROPONINI in the last 168 hours. BNP: Invalid input(s): POCBNP CBG: Recent Labs  Lab 02/06/24 1613 02/06/24 2026 02/06/24 2333 02/07/24 0418 02/07/24 0713  GLUCAP 236* 296* 325* 87 71   D-Dimer No results for input(s): DDIMER in the last 72 hours. Hgb A1c No results for input(s): HGBA1C in the last 72 hours. Lipid Profile No results for input(s): CHOL, HDL, LDLCALC, TRIG, CHOLHDL, LDLDIRECT in the last 72 hours. Thyroid  function studies No results for input(s): TSH, T4TOTAL, T3FREE, THYROIDAB in the last 72 hours.  Invalid input(s): FREET3 Anemia work up No results for input(s): VITAMINB12, FOLATE, FERRITIN, TIBC, IRON , RETICCTPCT in the last 72 hours. Urinalysis    Component Value Date/Time    COLORURINE YELLOW 01/31/2024 2134   APPEARANCEUR CLEAR 01/31/2024 2134   APPEARANCEUR Clear 07/05/2021 1129   LABSPEC 1.012 01/31/2024 2134   PHURINE 7.0 01/31/2024 2134   GLUCOSEU NEGATIVE 01/31/2024 2134   GLUCOSEU NEGATIVE 04/22/2021 1048   HGBUR SMALL (A) 01/31/2024 2134   BILIRUBINUR NEGATIVE 01/31/2024 2134   BILIRUBINUR Negative 07/05/2021 1129   KETONESUR NEGATIVE 01/31/2024 2134   PROTEINUR NEGATIVE 01/31/2024 2134   UROBILINOGEN 0.2 04/22/2021 1048   NITRITE NEGATIVE 01/31/2024 2134   LEUKOCYTESUR SMALL (A) 01/31/2024 2134   Sepsis Labs Recent Labs  Lab 02/02/24 0439 02/04/24 0337 02/06/24 0437 02/07/24 0435  WBC 5.9 6.8 6.2 6.9   Microbiology Recent Results (from the past 240 hours)  Resp panel by RT-PCR (RSV, Flu A&B, Covid) Anterior Nasal Swab     Status: None   Collection Time: 01/31/24  9:05 PM   Specimen: Anterior Nasal Swab  Result Value Ref Range Status   SARS Coronavirus 2 by RT PCR NEGATIVE NEGATIVE Final    Comment: (NOTE) SARS-CoV-2 target nucleic acids are NOT DETECTED.  The SARS-CoV-2 RNA is generally detectable in upper respiratory specimens during the acute phase of infection. The lowest concentration of SARS-CoV-2 viral copies this assay can detect is 138 copies/mL. A negative result does not preclude SARS-Cov-2 infection and should not be used as the sole basis for treatment or other patient management decisions. A negative result may occur with  improper specimen collection/handling, submission of specimen other than nasopharyngeal swab, presence of viral mutation(s) within the areas targeted by this assay, and inadequate number of viral copies(<138 copies/mL). A negative result must be combined with clinical observations, patient history, and epidemiological information. The expected result is Negative.  Fact Sheet for Patients:  BloggerCourse.com  Fact Sheet for Healthcare Providers:   SeriousBroker.it  This test  is no t yet approved or cleared by the [place]  FDA and  has been authorized for detection and/or diagnosis of SARS-CoV-2 by FDA under an Emergency Use Authorization (EUA). This EUA will remain  in effect (meaning this test can be used) for the duration of the COVID-19 declaration under Section 564(b)(1) of the Act, 21 U.S.C.section 360bbb-3(b)(1), unless the authorization is terminated  or revoked sooner.       Influenza A by PCR NEGATIVE NEGATIVE Final   Influenza B by PCR NEGATIVE NEGATIVE Final    Comment: (NOTE) The Xpert Xpress SARS-CoV-2/FLU/RSV plus assay is intended as an aid in the diagnosis of influenza from Nasopharyngeal swab specimens and should not be used as a sole basis for treatment. Nasal washings and aspirates are unacceptable for Xpert Xpress SARS-CoV-2/FLU/RSV testing.  Fact Sheet for Patients: BloggerCourse.com  Fact Sheet for Healthcare Providers: SeriousBroker.it  This test is not yet approved or cleared by the [place]  FDA and has been authorized for detection and/or diagnosis of SARS-CoV-2 by FDA under an Emergency Use Authorization (EUA). This EUA will remain in effect (meaning this test can be used) for the duration of the COVID-19 declaration under Section 564(b)(1) of the Act, 21 U.S.C. section 360bbb-3(b)(1), unless the authorization is terminated or revoked.     Resp Syncytial Virus by PCR NEGATIVE NEGATIVE Final    Comment: (NOTE) Fact Sheet for Patients: BloggerCourse.com  Fact Sheet for Healthcare Providers: SeriousBroker.it  This test is not yet approved or cleared by the [place]  FDA and has been authorized for detection and/or diagnosis of SARS-CoV-2 by FDA under an Emergency Use Authorization (EUA). This EUA will remain in effect (meaning this test can be used) for  the duration of the COVID-19 declaration under Section 564(b)(1) of the Act, 21 U.S.C. section 360bbb-3(b)(1), unless the authorization is terminated or revoked.  Performed at [facility], [address], [place], [place] [address]   Blood Culture (routine x 2)     Status: None   Collection Time: 01/31/24  9:15 PM   Specimen: BLOOD  Result Value Ref Range Status   Specimen Description BLOOD LEFT ANTECUBITAL  Final   Special Requests   Final    BOTTLES DRAWN AEROBIC AND ANAEROBIC Blood Culture results may not be optimal due to an inadequate volume of blood received in culture bottles   Culture   Final    NO GROWTH 5 DAYS Performed at [facility], [address] [place], [place] [address]    Report Status 02/05/2024 FINAL  Final  Blood Culture (routine x 2)     Status: None   Collection Time: 01/31/24  9:30 PM   Specimen: BLOOD  Result Value Ref Range Status   Specimen Description BLOOD BLOOD RIGHT HAND  Final   Special Requests   Final    BOTTLES DRAWN AEROBIC AND ANAEROBIC Blood Culture adequate volume   Culture   Final    NO GROWTH 5 DAYS Performed at [facility], [address] [place], [place] [address]    Report Status 02/05/2024 FINAL  Final  Urine Culture     Status: None   Collection Time: 01/31/24  9:34 PM   Specimen: Urine, Random  Result Value Ref Range Status   Specimen Description   Final    URINE, RANDOM Performed at [facility], [address] [place], [place] [address]    Special Requests   Final    NONE Reflexed from [id number] Performed at [facility], [address] [place], [place] [address]    Culture   Final    NO GROWTH Performed at [facility], [address], [place], [place] [address]    Report Status 02/02/2024 FINAL  Final  MRSA Next Gen by PCR, Nasal     Status: None   Collection Time: 02/01/24  1:23 AM   Specimen: Nasal Mucosa; Nasal Swab  Result Value Ref Range Status   MRSA by PCR Next Gen NOT DETECTED NOT DETECTED Final     Comment: (NOTE) The GeneXpert MRSA Assay (FDA approved for NASAL specimens only), is one component of a comprehensive MRSA colonization surveillance program. It is not intended to diagnose MRSA infection nor to guide or monitor treatment for MRSA infections. Test performance is not FDA approved in patients less than 30 years old. Performed at [facility], [address] [place], [place] [address]      Time coordinating discharge: 35 minutes  SIGNED:   Cornelius Dill, DO [facility] 02/08/2024, 9:47 AM  If 7PM-7AM, please contact night-coverage [email or web address]

## 2024-02-09 NOTE — Progress Notes (Signed)
 Patient seen and evaluated today.  He states that he is ready to get out of here.  No acute overnight events noted.  Awaiting signature and paperwork completion by POA to go to [facility].  Please refer to discharge summary dictated 6/12 for full details.  Total care time: 15 minutes.

## 2024-02-09 NOTE — TOC Transition Note (Signed)
 Transition of Care [facility]) - Discharge Note   Patient Details  Name: ABDULHAMID OLGIN MRN: [id number] Date of Birth: 1951/01/22  [facility] CM/SW Contact:  Orelia Binet, RN Phone Number: 02/09/2024, 12:16 PM   Clinical Narrative:   Consent signed with [facility]. Hospice ask TOC to schedule EMS. Patient is on the list for transport to [facility]. RN provided the number to call report.    Final next level of care: Hospice Medical Facility Barriers to Discharge: Barriers Resolved   Patient Goals and CMS Choice Patient states their goals for this hospitalization and ongoing recovery are:: return to SNF      Discharge Placement            Patient to be transferred to facility by: EMS   Patient and family notified of of transfer: 02/09/24  Discharge Plan and Services Additional resources added to the After Visit Summary for         Social Drivers of Health (SDOH) Interventions SDOH Screenings   Food Insecurity: Patient Unable To Answer (02/02/2024)  Housing: Patient Unable To Answer (02/02/2024)  Transportation Needs: Patient Unable To Answer (02/02/2024)  Utilities: Patient Unable To Answer (02/02/2024)  Depression (PHQ2-9): Medium Risk (09/13/2023)  Financial Resource Strain: Low Risk  (06/09/2022)  Social Connections: Patient Unable To Answer (02/02/2024)  Tobacco Use: Medium Risk (02/06/2024)     Readmission Risk Interventions    02/01/2024    3:00 PM  Readmission Risk Prevention Plan  Transportation Screening Complete  Medication Review (RN Care Manager) Complete  PCP or Specialist appointment within 3-5 days of discharge Not Complete  HRI or Home Care Consult Complete  SW Recovery Care/Counseling Consult Complete  Palliative Care Screening Not Complete  Skilled Nursing Facility Complete

## 2024-02-09 NOTE — Progress Notes (Signed)
 Pt discharged via stretcher by [facility] for transport to [facility].

## 2024-02-12 ENCOUNTER — Ambulatory Visit: Admitting: Nurse Practitioner

## 2024-02-14 ENCOUNTER — Ambulatory Visit: Admitting: Cardiology

## 2024-02-20 ENCOUNTER — Ambulatory Visit: Admitting: Nurse Practitioner

## 2024-02-27 DEATH — deceased

## 2024-03-05 ENCOUNTER — Inpatient Hospital Stay

## 2024-03-12 ENCOUNTER — Inpatient Hospital Stay: Admitting: Physician Assistant

## 2024-03-15 NOTE — Progress Notes (Signed)
 This encounter was created in error - please disregard.
